# Patient Record
Sex: Male | Born: 1955 | Race: Black or African American | Hispanic: No | Smoking: Former smoker
Health system: Southern US, Community
[De-identification: ages and names within clinical notes are randomized; demographics above are authoritative.]

## PROBLEM LIST (undated history)

## (undated) ENCOUNTER — Ambulatory Visit (HOSPITAL_COMMUNITY): Admission: EM | Discharge status: Absent without leave | Payer: 59

## (undated) ENCOUNTER — Ambulatory Visit (HOSPITAL_COMMUNITY): Admission: EM | Disposition: A | Discharge status: Home / Self Care | Payer: 59

## (undated) DIAGNOSIS — M1990 Unspecified osteoarthritis, unspecified site: Secondary | ICD-10-CM

## (undated) DIAGNOSIS — I10 Essential (primary) hypertension: Secondary | ICD-10-CM

## (undated) DIAGNOSIS — C801 Malignant (primary) neoplasm, unspecified: Secondary | ICD-10-CM

## (undated) DIAGNOSIS — Z87442 Personal history of urinary calculi: Secondary | ICD-10-CM

## (undated) DIAGNOSIS — R0600 Dyspnea, unspecified: Secondary | ICD-10-CM

## (undated) DIAGNOSIS — Z800 Family history of malignant neoplasm of digestive organs: Secondary | ICD-10-CM

## (undated) DIAGNOSIS — Z5189 Encounter for other specified aftercare: Secondary | ICD-10-CM

## (undated) DIAGNOSIS — H269 Unspecified cataract: Secondary | ICD-10-CM

## (undated) DIAGNOSIS — R7303 Prediabetes: Secondary | ICD-10-CM

## (undated) DIAGNOSIS — D649 Anemia, unspecified: Secondary | ICD-10-CM

## (undated) DIAGNOSIS — Z9221 Personal history of antineoplastic chemotherapy: Secondary | ICD-10-CM

## (undated) DIAGNOSIS — H5359 Other color vision deficiencies: Secondary | ICD-10-CM

## (undated) DIAGNOSIS — I499 Cardiac arrhythmia, unspecified: Secondary | ICD-10-CM

## (undated) DIAGNOSIS — Z973 Presence of spectacles and contact lenses: Secondary | ICD-10-CM

## (undated) DIAGNOSIS — E785 Hyperlipidemia, unspecified: Secondary | ICD-10-CM

## (undated) DIAGNOSIS — T7840XA Allergy, unspecified, initial encounter: Secondary | ICD-10-CM

## (undated) DIAGNOSIS — I739 Peripheral vascular disease, unspecified: Secondary | ICD-10-CM

## (undated) DIAGNOSIS — R0683 Snoring: Secondary | ICD-10-CM

## (undated) DIAGNOSIS — Z923 Personal history of irradiation: Secondary | ICD-10-CM

## (undated) DIAGNOSIS — G2581 Restless legs syndrome: Secondary | ICD-10-CM

## (undated) DIAGNOSIS — M79669 Pain in unspecified lower leg: Secondary | ICD-10-CM

## (undated) DIAGNOSIS — R591 Generalized enlarged lymph nodes: Secondary | ICD-10-CM

## (undated) DIAGNOSIS — J449 Chronic obstructive pulmonary disease, unspecified: Secondary | ICD-10-CM

## (undated) HISTORY — DX: Peripheral vascular disease, unspecified: I73.9

## (undated) HISTORY — DX: Encounter for other specified aftercare: Z51.89

## (undated) HISTORY — DX: Personal history of irradiation: Z92.3

## (undated) HISTORY — DX: Family history of malignant neoplasm of digestive organs: Z80.0

## (undated) HISTORY — DX: Cardiac arrhythmia, unspecified: I49.9

## (undated) HISTORY — DX: Restless legs syndrome: G25.81

## (undated) HISTORY — DX: Malignant (primary) neoplasm, unspecified: C80.1

## (undated) HISTORY — DX: Anemia, unspecified: D64.9

## (undated) HISTORY — DX: Allergy, unspecified, initial encounter: T78.40XA

## (undated) HISTORY — DX: Unspecified osteoarthritis, unspecified site: M19.90

## (undated) HISTORY — DX: Pain in unspecified lower leg: M79.669

## (undated) HISTORY — PX: CATARACT EXTRACTION W/ INTRAOCULAR LENS  IMPLANT, BILATERAL: SHX1307

## (undated) HISTORY — PX: REFRACTIVE SURGERY: SHX103

## (undated) HISTORY — PX: FRACTURE SURGERY: SHX138

## (undated) HISTORY — DX: Hyperlipidemia, unspecified: E78.5

## (undated) HISTORY — PX: OTHER SURGICAL HISTORY: SHX169

## (undated) HISTORY — DX: Unspecified cataract: H26.9

## (undated) HISTORY — DX: Personal history of antineoplastic chemotherapy: Z92.21

## (undated) HISTORY — DX: Other color vision deficiencies: H53.59

## (undated) HISTORY — DX: Prediabetes: R73.03

## (undated) HISTORY — PX: COLONOSCOPY: SHX174

---

## 1993-05-09 HISTORY — PX: HAND ARTHROPLASTY: SHX968

## 1999-06-24 ENCOUNTER — Encounter: Payer: Self-pay | Admitting: Emergency Medicine

## 1999-06-24 ENCOUNTER — Emergency Department (HOSPITAL_COMMUNITY): Admission: EM | Admit: 1999-06-24 | Discharge: 1999-06-24 | Payer: Self-pay | Admitting: *Deleted

## 1999-07-02 ENCOUNTER — Encounter: Payer: Self-pay | Admitting: Orthopedic Surgery

## 1999-07-02 ENCOUNTER — Observation Stay (HOSPITAL_COMMUNITY): Admission: RE | Admit: 1999-07-02 | Discharge: 1999-07-02 | Payer: Self-pay | Admitting: Orthopedic Surgery

## 2002-11-17 ENCOUNTER — Emergency Department (HOSPITAL_COMMUNITY): Admission: EM | Admit: 2002-11-17 | Discharge: 2002-11-17 | Payer: Self-pay | Admitting: Emergency Medicine

## 2003-05-10 HISTORY — PX: ORIF FOOT FRACTURE: SHX2123

## 2006-04-04 ENCOUNTER — Emergency Department (HOSPITAL_COMMUNITY): Admission: EM | Admit: 2006-04-04 | Discharge: 2006-04-04 | Payer: Self-pay | Admitting: Emergency Medicine

## 2006-04-04 IMAGING — CR DG CHEST 1V PORT
1 series · 1 of 1 positions shown · non-contrast
Comparison: none

CLINICAL DATA: Chest pain

Portable chest at [WB]:
Comparison [DATE] by report only. The heart size and mediastinal contours are
within normal limits.  Both lungs are clear.  The visualized skeletal structures
are unremarkable.

[view not recorded]
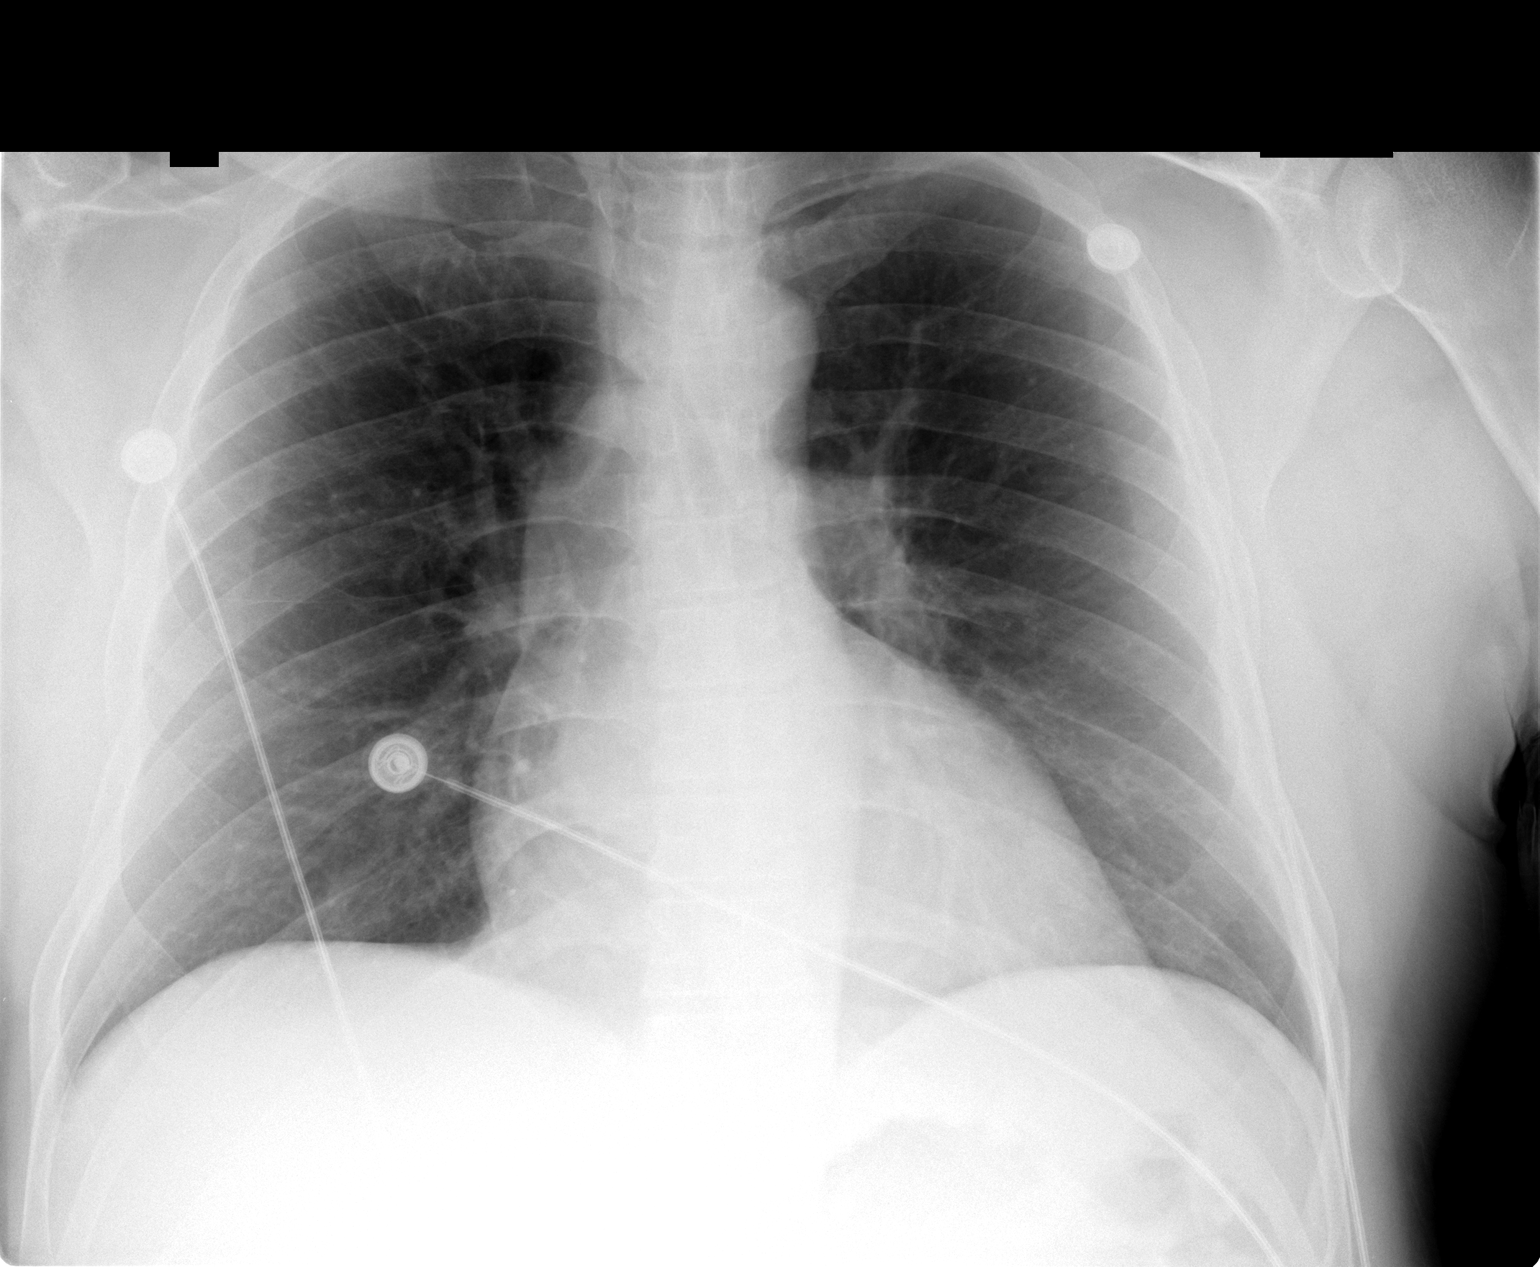

[1 of 1 positions shown; findings below may reference images not displayed]

IMPRESSION: 1. No active cardiopulmonary disease.

## 2006-05-08 ENCOUNTER — Ambulatory Visit: Payer: Self-pay | Admitting: Cardiovascular Disease

## 2006-05-22 ENCOUNTER — Ambulatory Visit: Payer: Self-pay

## 2009-08-23 ENCOUNTER — Emergency Department (HOSPITAL_COMMUNITY): Admission: EM | Admit: 2009-08-23 | Discharge: 2009-08-23 | Payer: Self-pay | Admitting: Emergency Medicine

## 2010-02-12 ENCOUNTER — Encounter: Admission: RE | Admit: 2010-02-12 | Discharge: 2010-02-12 | Payer: Self-pay | Admitting: Family Medicine

## 2010-02-12 IMAGING — CT CT ABD-PELV W/ CM
3 of 5 series · 13 of 36 positions shown, 19 images · IV contrast (READICAT/WATER & [ID] OMNI 300)
Comparison: None

CLINICAL DATA: Abdominal pain.  Left upper quadrant fullness.

CT ABDOMEN AND PELVIS WITH CONTRAST
TECHNIQUE: Multidetector CT imaging of the abdomen and pelvis was
performed following the standard protocol during bolus
administration of intravenous contrast.
Contrast: 125 ml of omni 300

[Series 3: routine abdomen · axial · 0.82mm/px · z∈[-414,-64]mm · 8 of 92 slices shown, 13 images]
[im 11/92  soft-tissue]
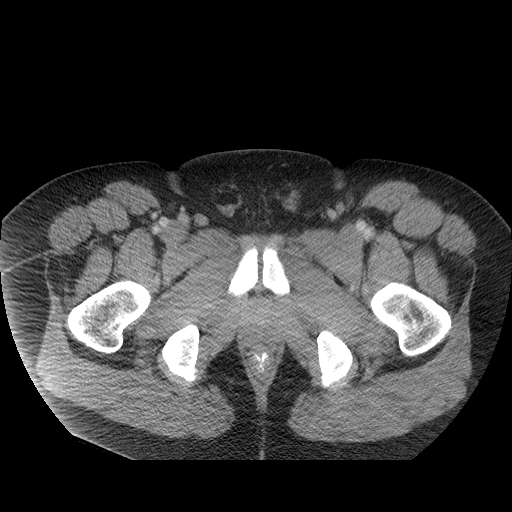
[im 11/92  bone]
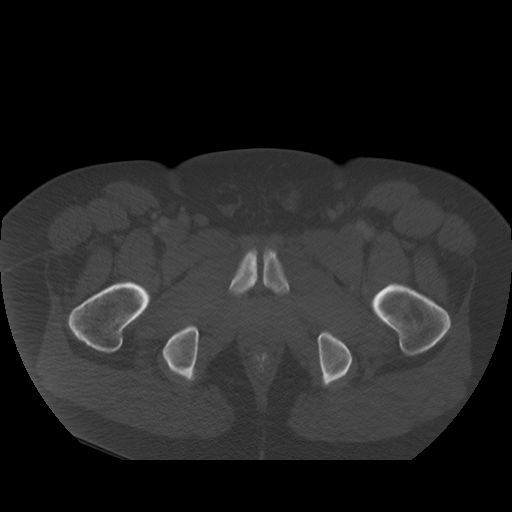
[im 21/92  soft-tissue]
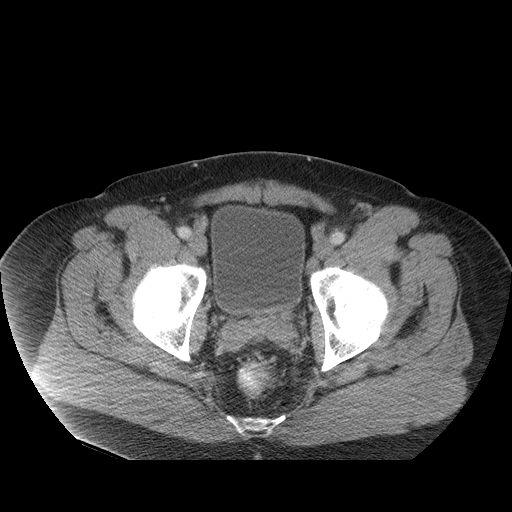
[im 31/92  soft-tissue]
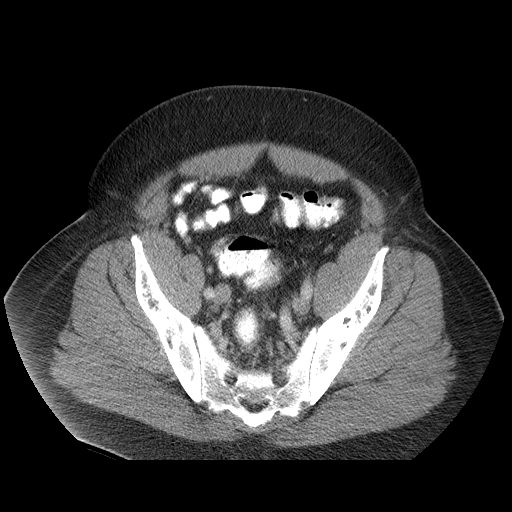
[im 41/92  soft-tissue]
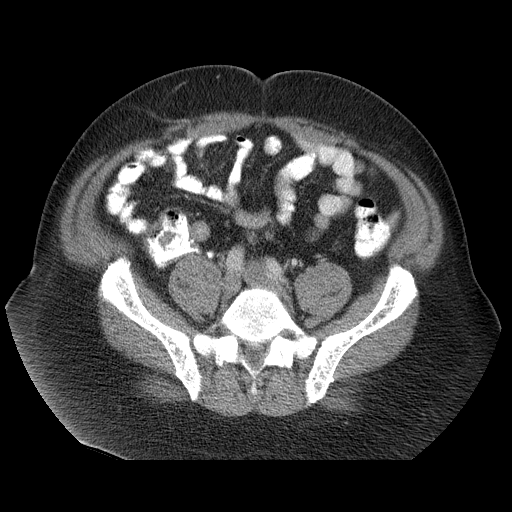
[im 51/92  soft-tissue]
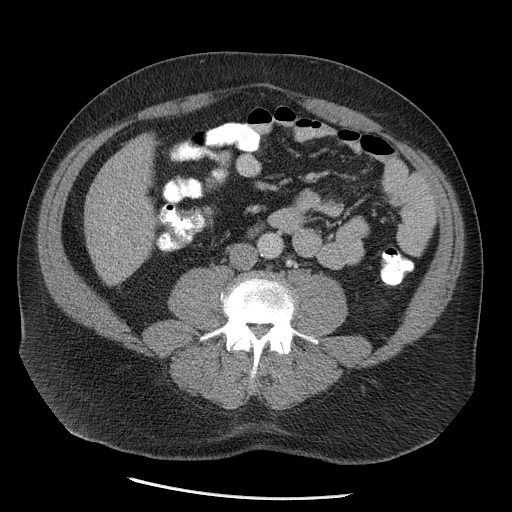
[im 51/92  lung]
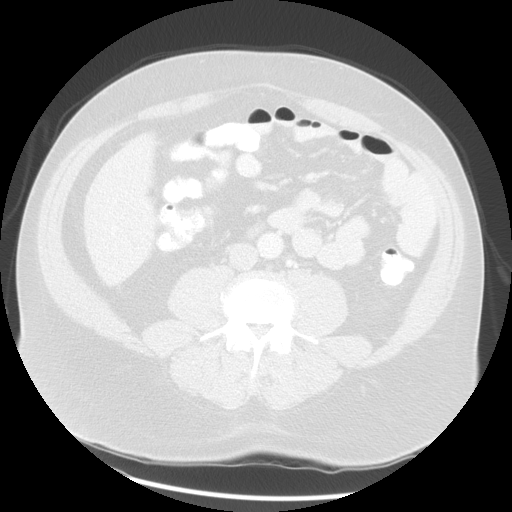
[im 61/92  soft-tissue]
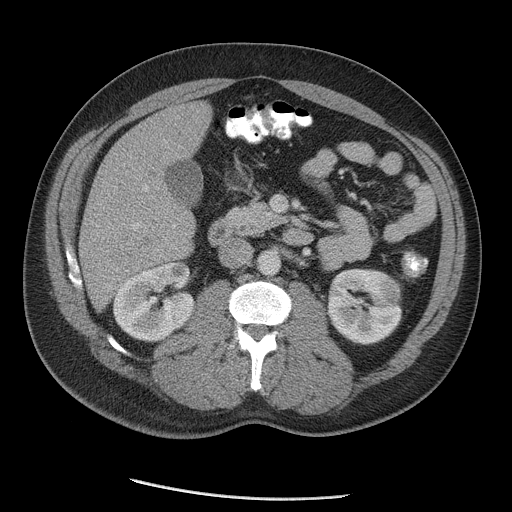
[im 61/92  lung]
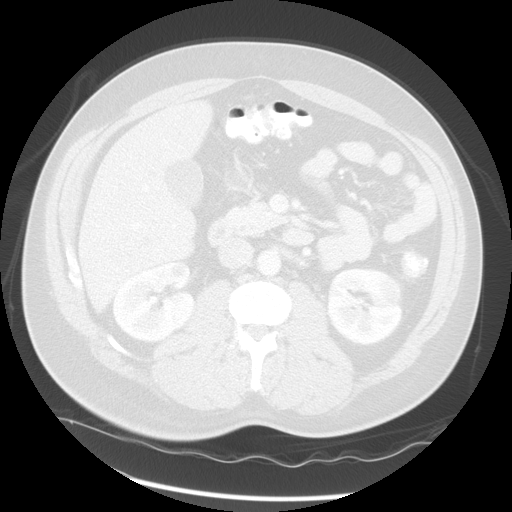
[im 71/92  soft-tissue]
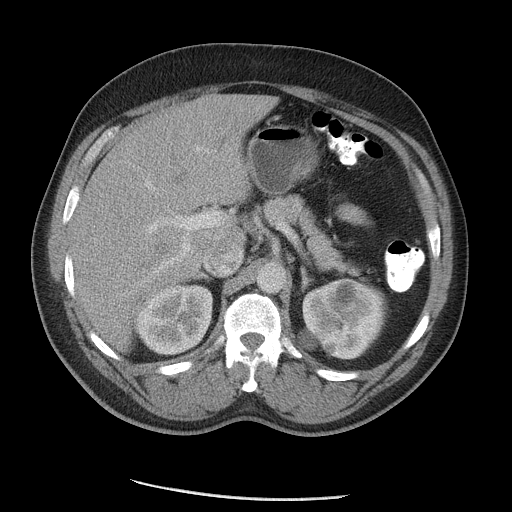
[im 71/92  lung]
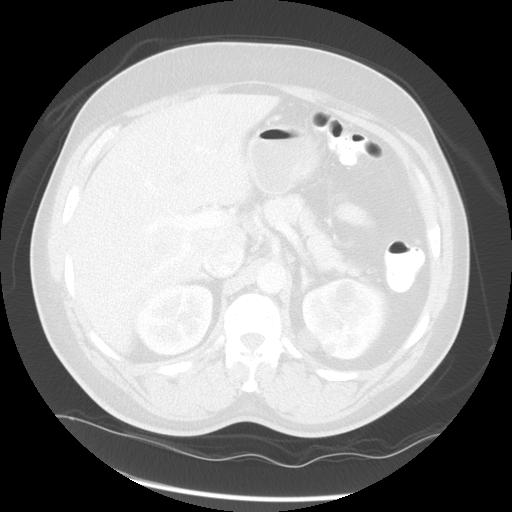
[im 81/92  soft-tissue]
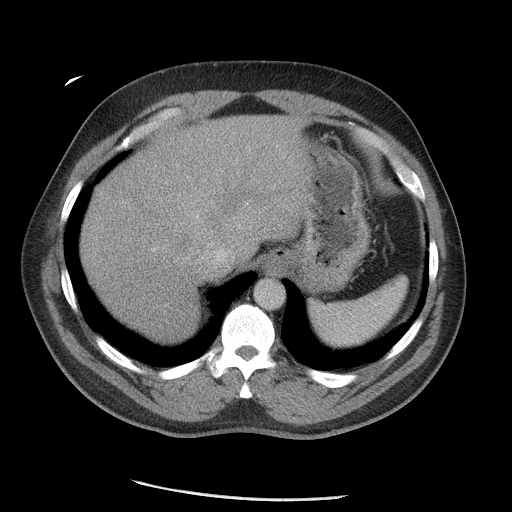
[im 81/92  lung]
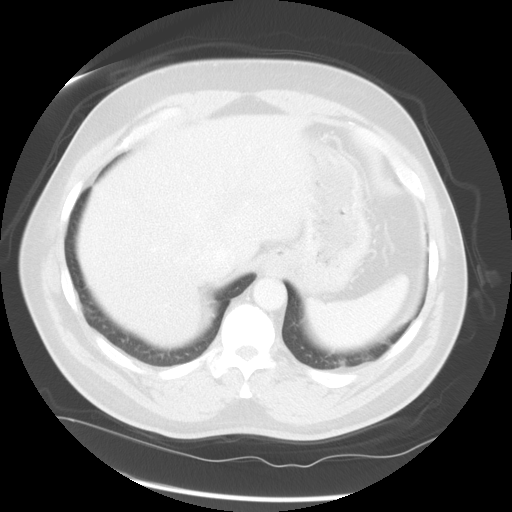

[Series 601: coronal body · coronal · 0.95mm/px · 1 of 150 slices shown, 2 images]
[im 50/150  soft-tissue]
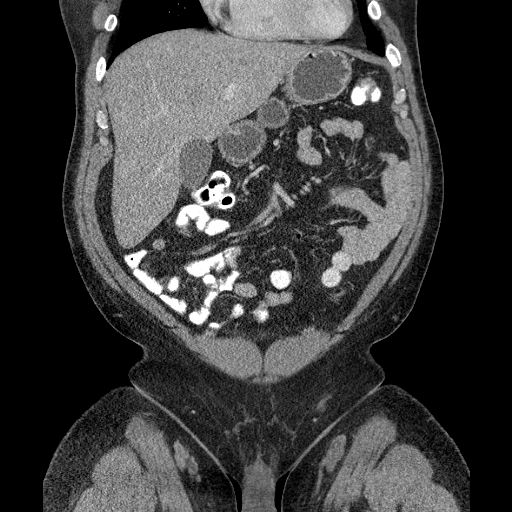
[im 50/150  bone]
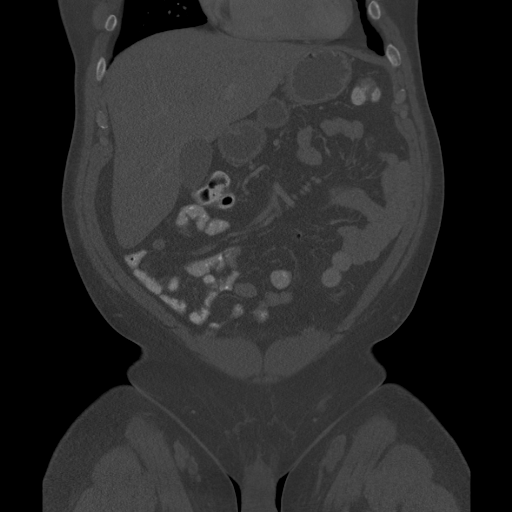

[Series 602: sagittal body · sagittal · 0.95mm/px · 4 of 170 slices shown]
[im 11/170  soft-tissue]
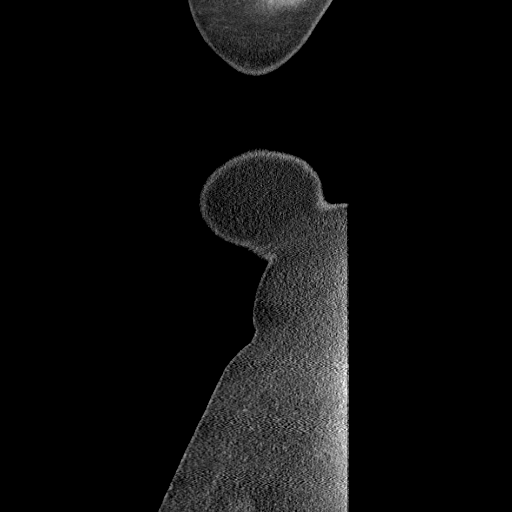
[im 32/170  soft-tissue]
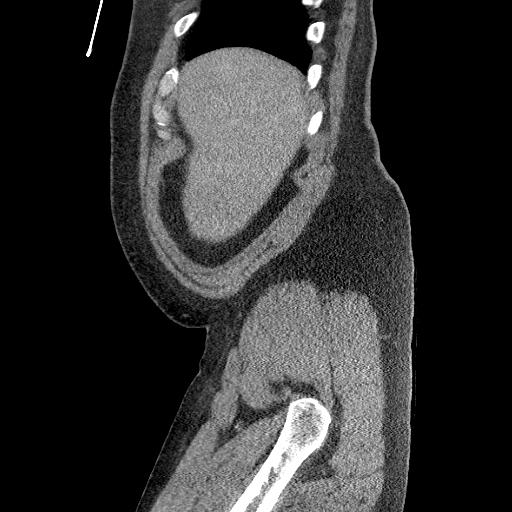
[im 53/170  soft-tissue]
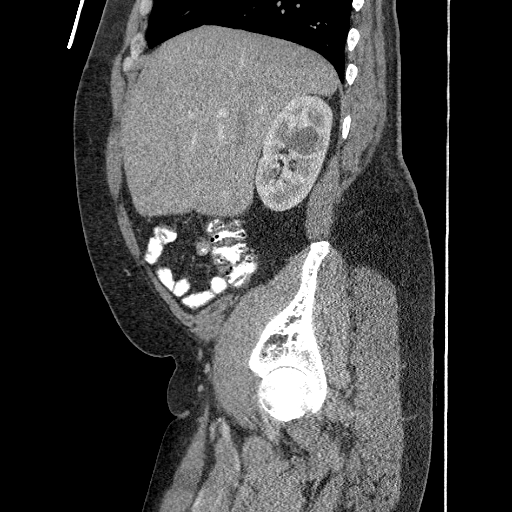
[im 74/170  soft-tissue]
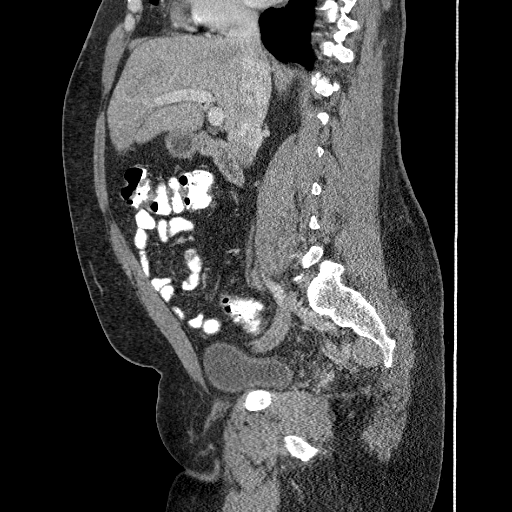

[13 of 36 positions shown; findings below may reference images not displayed]

FINDINGS: The lung bases are clear.

Liver is normal.

Spleen is normal.

Pancreas is normal.

The adrenal glands are normal.

 Multiple bilateral renal cysts are identified.

Gallbladder is unremarkable by CT.

No biliary ductal dilation.

Stomach and visualized large and small bowel are unremarkable.

Abdominal aorta normal is in caliber.

No significant lymphadenopathy.

No free fluid or abnormal fluid collections.

Appendix identified and normal.

Visualized colon and small bowel are unremarkable.

No free fluid or abnormal fluid collections.

There is no pelvic adenopathy.

Within the left inguinal region there is a pathologically enlarged
lymph node that measures 2.6 x 3.1 cm.

Urinary bladder is normal.

Review of the visualized osseous structures is significant for mild
spondylosis.
IMPRESSION: 1.  No acute findings within the abdomen or pelvis.
2.  Enlarged left inguinal lymph node is indeterminate.  This may
be reactive in etiology.  Differential considerations include
granulomatous inflammation or infection, metastatic adenopathy or
lymphoma.

## 2010-09-24 NOTE — Op Note (Signed)
[place]  Patient:    Jesus [name], Jesus [name]                       MRN: [id number] Proc. Date: 07/02/99 Adm. Date:  [id number] Disc. Date: [id number] Attending:  Carlyon Shadow                           Operative Report  PREOPERATIVE DIAGNOSIS:  Displaced left fifth metatarsal base fracture.  POSTOPERATIVE DIAGNOSIS:  Displaced left fifth metatarsal base fracture.  PROCEDURE:  Open reduction and internal fixation of left fifth metatarsal fracture.  SURGEON:  Metta Clines. Supple, M.D.  ASSISTANT:  Alexzandrew L. Perkins, P.A.-C.  ANESTHESIA:  Laryngeal mask general.  TOURNIQUET TIME:  Available from the anesthesia record.  ESTIMATED BLOOD LOSS:  Minimal.  HISTORY:  Mr. Candelas is a 55 year old gentleman who sustained an inversion twisting injury to his left foot with immediate onset of lateral mid foot pain. He was seen in local ER where x-ray showed a displaced fifth metatarsal base fracture. On follow-up in the office, examination showed a markedly swollen and tender lateral mid foot and x-rays confirmed the displaced proximal fifth metatarsal fracture.  Given the involvement of the fifth tarsometatarsal joint, it was felt that he would benefit from open reduction and internal fixation.  Preoperatively, he was counseled on treatment options, as well as risks versus benefits.  Possible complications of bleeding, infection, neurovascular injury,  DVT, persistence of pain, post traumatic arthrosis, nonunion, malunion, loss of  fixation, possible need for additional were reviewed. He understands and accepts and agrees to the plan of procedure.  DESCRIPTION OF PROCEDURE IN DETAIL:  After undergoing routine preoperative evaluation, the patient was placed supine on the operative table and underwent induction of laryngeal mask general anesthesia.  Tourniquet applied to the left  thigh.  The left leg was sterilely prepped and draped in the  standard fashion. The left foot was then sterilely prepped and draped in the standard fashion.  Left as exsanguinated with tourniquet inflated to 350 mmHg.  A lateral incision centered over the base of the fifth metatarsal was then made with sharp dissection. Electrocautery was used for hemostasis.  Dissection carried down to the fifth metatarsal base with the fracture site exposed.  Periosteal elevation was then sed to exposed the margins of the fracture site.  The fracture site itself was copiously irrigated.  The proximal aspect of the fifth metatarsal was then reduced utilizing a Mayo towel clip allowing an anatomic reduction.  Next fluoroscopic guidance was then utilized to pass the guide pin for the 4.5 cannulated self-tapping screws.  Proper position was confirmed on both AP and lateral fluoroscopic views.  This was then drilled and a 60 mm 4.5 cannulated screw was  then passed across the fracture site into the fifth metatarsal.  Good purchase as obtained and good alignment was confirmed on the radiographs on AP and lateral images.  The wound was then irrigated.  It was closed with 2-0 Vicryl for the subcutaneous and staples applied to the skin.  Adaptic and a bulky dry dressing was then applied to the left food.  A well padded plaster U-splint was applied with the ankle in the neutral position and slight eversion.  Tourniquet was let down. The patient was then extubated and taken to the recovery room in stable condition. DD:  07/02/99 TD:  07/03/99 Job: [id number]

## 2010-09-24 NOTE — Assessment & Plan Note (Signed)
[facility]                            CARDIOLOGY OFFICE NOTE   JAHDIEL, KROL                       MRN:          [id number]  DATE:05/08/2006                            DOB:          [birth year]    Mr. Ala is a 55 year old patient referred by the [facility]  emergency room and Dr. Shirline Frees.   He was in the emergency room on November 27 for chest pain.  The  evaluation was negative and he was sent home.  The patient has coronary  risk factors, which include high blood pressure, which is not treated,  and smoking.  His lipid status is unknown.  He has seen Dr. Kenton Kingfisher of  [facility] for a while, and I suspect there are some lipids on file.  The patient's chest pain is atypical.  It is sometimes exertional,  sometimes it occurs at work, but it can occur at rest.  He has had  pleurisy 10 years ago, and this reminds him of the pain.   He has not had any significant shortness of breath, cough, or lower  extremity swelling.   As far as he knows, he has not had a stress test.   Since his visit to the emergency room, he continues to get occasional  pain in his chest.  It does radiate down the arms on occasion.   He has no musculoskeletal problems and no previous history of cervical  radiculopathy.   REVIEW OF SYSTEMS:  Otherwise, negative.  He takes no medications.  He  denies any allergies.   His only previous surgery involved his foot in 1999.  He is a  [employer] officer, mixing chemicals in cleaning tanks.  He is separated.  He  has 3 children.  He likes to bowl in his spare time.   FAMILY HISTORY:  Unremarkable.  His mother is alive at age 48.  His  father died at age 39 of old age.   The patient smokes a pack a day for 29 years.  He does not drink  excessively.   EXAM:  HEENT:  Normal.  LUNGS:  Clear.  Carotids are normal.  There is no bruit.  JVP is normal.  There is an  S1, S2.  Normal heart sounds.  ABDOMEN:  Benign.  LOWER  EXTREMITIES:  Intact pulses.  No edema.  SKIN:  Warm and dry.  NEURO:  Nonfocal.   EKG:  Sinus rhythm with right bundle branch block, left axis deviation.   VITAL SIGNS:  Included a blood pressure of 154/93 and a pulse of 98.   IMPRESSION:  Mr. Marcussen presents with several issues.  His chest pain.  Although it is atypical, he is a smoker with probable high blood  pressure.  I think he deserves to have a stress test.  As his baseline  electrocardiogram is abnormal, we will arrange for him to have a stress  Myoview.  I am also anxious to see what his blood pressure response is.  I am sure his systolics will be close to 200.  Along these lines, I  think  he needs to be on blood pressure medicine.  I think, since he is  relatively tachycardic, we will start him on a beta blocker.  He is a  little bit concerned about erectile dysfunction with this, and I told  him we would start on a low dose.  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  In  regards to his abnormal electrocardiogram, I will contact Dr. Kenton Kingfisher'  office and see if we have an old electrocardiogram to compare to.  I do  not think that the low-dose beta blocker will precipitate any high-grade  AV block given his hypertension and high resting heart rate, I think  that the beta blocker is the best choice.  If, for some reason, his  heart block worsens or he has significant erectile dysfunction, then we  can switch him to an angiotensin-converting enzyme inhibitor.   I talked to him at length about losing weight, his diet, and the need  for increased exercise.   I will see him back after his stress Myoview.     Wallis Bamberg. Johnsie Cancel, MD, [facility]  Electronically Signed    PCN/MedQ  DD: 05/08/2006  DT: 05/08/2006  Job #: [id number]

## 2011-05-08 ENCOUNTER — Ambulatory Visit (INDEPENDENT_AMBULATORY_CARE_PROVIDER_SITE_OTHER): Payer: 59

## 2011-05-08 DIAGNOSIS — L02619 Cutaneous abscess of unspecified foot: Secondary | ICD-10-CM

## 2011-05-08 DIAGNOSIS — L03119 Cellulitis of unspecified part of limb: Secondary | ICD-10-CM

## 2011-05-08 DIAGNOSIS — L241 Irritant contact dermatitis due to oils and greases: Secondary | ICD-10-CM

## 2014-01-15 ENCOUNTER — Encounter (HOSPITAL_BASED_OUTPATIENT_CLINIC_OR_DEPARTMENT_OTHER): Payer: Self-pay | Admitting: *Deleted

## 2014-01-15 NOTE — Progress Notes (Signed)
01/15/14 1145  OBSTRUCTIVE SLEEP APNEA  Have you ever been diagnosed with sleep apnea through a sleep study? No  Do you snore loudly (loud enough to be heard through closed doors)?  1  Do you often feel tired, fatigued, or sleepy during the daytime? 0  Has anyone observed you stop breathing during your sleep? 0  Do you have, or are you being treated for high blood pressure? 1  BMI more than 35 kg/m2? 1  Age over 58 years old? 1  Neck circumference greater than 40 cm/16 inches? 1  Gender: 1  Obstructive Sleep Apnea Score 6  Score 4 or greater  Results sent to PCP

## 2014-01-15 NOTE — Progress Notes (Signed)
To come in for bmet-ekg

## 2014-01-16 ENCOUNTER — Encounter (HOSPITAL_BASED_OUTPATIENT_CLINIC_OR_DEPARTMENT_OTHER)
Admission: RE | Admit: 2014-01-16 | Discharge: 2014-01-16 | Disposition: A | Discharge status: Home / Self Care | Payer: 59 | Source: Ambulatory Visit | Attending: Otolaryngology | Admitting: Otolaryngology

## 2014-01-16 DIAGNOSIS — Z79899 Other long term (current) drug therapy: Secondary | ICD-10-CM | POA: Diagnosis not present

## 2014-01-16 DIAGNOSIS — F17200 Nicotine dependence, unspecified, uncomplicated: Secondary | ICD-10-CM | POA: Diagnosis not present

## 2014-01-16 DIAGNOSIS — J383 Other diseases of vocal cords: Secondary | ICD-10-CM | POA: Diagnosis present

## 2014-01-16 DIAGNOSIS — I10 Essential (primary) hypertension: Secondary | ICD-10-CM | POA: Diagnosis not present

## 2014-01-16 LAB — BASIC METABOLIC PANEL WITH GFR
BUN: 19 mg/dL (ref 6–23)
CO2: 25 meq/L (ref 19–32)
GFR calc non Af Amer: 81 mL/min — ABNORMAL LOW (ref >90)
Glucose, Bld: 110 mg/dL — ABNORMAL HIGH (ref 70–99)
Potassium: 3.7 meq/L (ref 3.7–5.3)
Sodium: 142 meq/L (ref 137–147)

## 2014-01-16 LAB — BASIC METABOLIC PANEL
Anion gap: 12 (ref 5–15)
Calcium: 8.7 mg/dL (ref 8.4–10.5)
Chloride: 105 mEq/L (ref 96–112)
Creatinine, Ser: 1.01 mg/dL (ref 0.50–1.35)
GFR calc Af Amer: >90 mL/min (ref >90)

## 2014-01-16 NOTE — Progress Notes (Signed)
ekg cleared by dr e. Ola Spurr

## 2014-01-16 NOTE — H&P (Signed)
Assessment  Hoarseness (784.42) (R49.0). Smokes tobacco daily (305.1) (Z72.0). 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 Discussed  Chronic hoarseness and a smoker with a left anterior vocal cord lesion. This could represent papilloma, granuloma, inflammatory nodule, carcinoma. Recommend microlaryngoscopy with excision and biopsy. Urged him to try to stop smoking. Reason For Visit  Throat and hoarseness. HPI  One month history of hoarseness. It seems to be partially intermittent. He denies any sore throat or difficulty swallowing. He smokes one pack per day. He drinks alcohol several times each week and drinks a couple of cups of coffee most days. Allergies  Rec: [id number].  List Reconciled and Reviewed. No Known Drug Allergies Amended Iran Sizer  LPN; 25/63/8937 3:42 PM EST. Current Meds  Rec: [id number].  List Reconciled and Reviewed. Cialis TABS;; RPT Lisinopril TABS;; RPT Norvasc TABS (AmLODIPine Besylate);; RPT Amended : Iran Sizer  LPN; 87/68/1157 2:62 PM EST. 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 [facility] Hand Surgery Amended : Iran Sizer  LPN; 45/36/4680 3:21 PM EST. Family Hx  Family history of hypertension: Father (V17.49) (Z82.49) No pertinent family history: Mother Amended Iran Sizer  LPN; 22/48/2500 3:70 PM EST. 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 ROS  12 system ROS was obtained and reviewed on the Health Maintenance form dated today.  Positive responses are shown above.  If the symptom is not checked, the patient has denied it. Vital Signs   Recorded by Skolimowski,Sharon on 09 Jan 2014 02:10 PM BP:132/80,  Height: 6 ft , Weight: 271 lb , BMI: 36.8 kg/m2,  BMI Calculated: 36.75 ,  BSA Calculated: 2.42  Amended : Iran Sizer  LPN; 38/88/2800 3:49 PM EST. Physical Exam  APPEARANCE: Well developed, overweight gentleman, in no acute distress.  Normal affect, in a pleasant mood.  Oriented to time, place and person. COMMUNICATION: Normal voice   HEAD & FACE:  No scars, lesions or masses of head and face.  Sinuses nontender to palpation.  Salivary glands without mass or tenderness.  Facial strength symmetric.  No facial lesion, scars, or mass. EYES: EOMI with normal primary gaze alignment. Visual acuity grossly intact.  PERRLA EXTERNAL EAR & NOSE: No scars, lesions or masses  EAC & TYMPANIC MEMBRANE:  EAC shows no obstructing lesions or debris and tympanic membranes are normal bilaterally with good movement to insufflation. GROSS HEARING: Normal   TMJ:  Nontender  INTRANASAL EXAM: No polyps or purulence.  NASOPHARYNX: Normal, without lesions. LIPS, TEETH & GUMS: No lip lesions,  and normal gums. ORAL CAVITY/OROPHARYNX:  Oral mucosa moist without lesion or asymmetry of the palate, tongue, tonsil or posterior pharynx. Unable to visualize the cords with indirect exam. NECK:  Supple without adenopathy or mass. THYROID:  Normal with no masses palpable.  NEUROLOGIC:  No gross CN deficits. No nystagmus noted.   LYMPHATIC:  No enlarged nodes palpable. Procedure  Fiberoptic Laryngoscopy Name: Jesus [name]     Age: 58 year     The risks and benefits of this procedure have been thoroughly discussed with the patient/parent.  The most commons risks outlined included but were not  limited to: injury  to the nasal mucosa or throat irritation.  The patient/parent was further informed that there are other less common risks.  The patient/parent was given the opportunity to ask questions and all such questions were answered to the  patient/parent's satisfaction.  Patient/parent acknowledged the risks and has agreed to proceed.   Performing Provider: Izora Gala The risks of the procedure are minimal and were discussed with the patient today. Pre-op Diagnosis: hoarseness  Post-op Diagnosis:Same Allergy:  reviewed allergies as listed Nasal Prep:Lidocaine/Afrin   Procedure:     With the patient seated in the exam chair, the L nasal cavity was intubated with the flexible laryngoscope.  The nasal cavity mucosa, nasopharynx, hypopharynx and larynx were all examined with findings as noted.  The scope was then removed.  The patient tolerated the procedure well without complication or blood loss (unless indicated in findings).   FINDINGS:   Nasal cavity and nasopharynx clear. Oral cavity and oropharynx are clear. Hypopharynx clear. Larynx reveals mobile cords. There is fullness of the arytenoids bilaterally. There is a small exophytic growth along the contacting surface of the left anterior vocal folds. No other lesions are identified. . Signature  Electronically signed by : Izora Gala  M.D.; 01/09/2014 11:50 AM EST. Electronically signed by : Izora Gala  M.D.; 01/09/2014 2:15 PM EST.

## 2014-01-17 ENCOUNTER — Ambulatory Visit (HOSPITAL_BASED_OUTPATIENT_CLINIC_OR_DEPARTMENT_OTHER): Payer: 59 | Admitting: Anesthesiology

## 2014-01-17 ENCOUNTER — Ambulatory Visit (HOSPITAL_BASED_OUTPATIENT_CLINIC_OR_DEPARTMENT_OTHER)
Admission: RE | Admit: 2014-01-17 | Discharge: 2014-01-17 | Disposition: A | Discharge status: Home / Self Care | Payer: 59 | Source: Ambulatory Visit | Attending: Otolaryngology | Admitting: Otolaryngology

## 2014-01-17 ENCOUNTER — Encounter (HOSPITAL_BASED_OUTPATIENT_CLINIC_OR_DEPARTMENT_OTHER)
Admission: RE | Disposition: A | Discharge status: Home / Self Care | Payer: Self-pay | Source: Ambulatory Visit | Attending: Otolaryngology

## 2014-01-17 ENCOUNTER — Encounter (HOSPITAL_BASED_OUTPATIENT_CLINIC_OR_DEPARTMENT_OTHER): Payer: Self-pay

## 2014-01-17 ENCOUNTER — Encounter (HOSPITAL_BASED_OUTPATIENT_CLINIC_OR_DEPARTMENT_OTHER): Payer: 59 | Admitting: Anesthesiology

## 2014-01-17 DIAGNOSIS — Z79899 Other long term (current) drug therapy: Secondary | ICD-10-CM | POA: Insufficient documentation

## 2014-01-17 DIAGNOSIS — F17200 Nicotine dependence, unspecified, uncomplicated: Secondary | ICD-10-CM | POA: Insufficient documentation

## 2014-01-17 DIAGNOSIS — I10 Essential (primary) hypertension: Secondary | ICD-10-CM | POA: Insufficient documentation

## 2014-01-17 DIAGNOSIS — J383 Other diseases of vocal cords: Secondary | ICD-10-CM | POA: Insufficient documentation

## 2014-01-17 HISTORY — DX: Essential (primary) hypertension: I10

## 2014-01-17 HISTORY — DX: Presence of spectacles and contact lenses: Z97.3

## 2014-01-17 HISTORY — DX: Snoring: R06.83

## 2014-01-17 HISTORY — PX: MICROLARYNGOSCOPY: SHX5208

## 2014-01-17 LAB — POCT HEMOGLOBIN-HEMACUE: Hemoglobin: 13.2 g/dL (ref 13.0–17.0)

## 2014-01-17 SURGERY — MICROLARYNGOSCOPY
Anesthesia: General | Site: Throat | Laterality: Left

## 2014-01-17 MED ORDER — FENTANYL CITRATE 0.05 MG/ML IJ SOLN: 50.0000 ug | INTRAMUSCULAR | Status: DC | PRN

## 2014-01-17 MED ORDER — PROPOFOL 10 MG/ML IV BOLUS
INTRAVENOUS | Status: DC | PRN
  Administered 2014-01-17: 200 mg via INTRAVENOUS

## 2014-01-17 MED ORDER — MIDAZOLAM HCL 2 MG/2ML IJ SOLN: 1.0000 mg | INTRAMUSCULAR | Status: DC | PRN

## 2014-01-17 MED ORDER — LIDOCAINE HCL (CARDIAC) 20 MG/ML IV SOLN
INTRAVENOUS | Status: DC | PRN
  Administered 2014-01-17: 50 mg via INTRAVENOUS

## 2014-01-17 MED ORDER — MIDAZOLAM HCL 5 MG/5ML IJ SOLN
INTRAMUSCULAR | Status: DC | PRN
  Administered 2014-01-17: 2 mg via INTRAVENOUS

## 2014-01-17 MED ORDER — PROPOFOL 10 MG/ML IV EMUL
INTRAVENOUS | Status: AC
  Filled 2014-01-17: qty 100

## 2014-01-17 MED ORDER — LIDOCAINE-EPINEPHRINE 1 %-1:100000 IJ SOLN
INTRAMUSCULAR | Status: AC
Start: 2014-01-17 — End: 2014-01-17
  Filled 2014-01-17: qty 2

## 2014-01-17 MED ORDER — ONDANSETRON HCL 4 MG/2ML IJ SOLN: 4.0000 mg | Freq: Once | INTRAMUSCULAR | Status: DC | PRN

## 2014-01-17 MED ORDER — EPINEPHRINE HCL 1 MG/ML IJ SOLN
INTRAMUSCULAR | Status: AC
  Filled 2014-01-17: qty 1

## 2014-01-17 MED ORDER — NEOSTIGMINE METHYLSULFATE 10 MG/10ML IV SOLN
INTRAVENOUS | Status: DC | PRN
  Administered 2014-01-17: 4 mg via INTRAVENOUS

## 2014-01-17 MED ORDER — ONDANSETRON HCL 4 MG/2ML IJ SOLN
INTRAMUSCULAR | Status: DC | PRN
  Administered 2014-01-17: 4 mg via INTRAVENOUS

## 2014-01-17 MED ORDER — OXYCODONE HCL 5 MG/5ML PO SOLN: 5.0000 mg | Freq: Once | ORAL | Status: DC | PRN

## 2014-01-17 MED ORDER — FENTANYL CITRATE 0.05 MG/ML IJ SOLN
INTRAMUSCULAR | Status: DC | PRN
  Administered 2014-01-17: 50 ug via INTRAVENOUS

## 2014-01-17 MED ORDER — OXYCODONE HCL 5 MG PO TABS: 5.0000 mg | ORAL_TABLET | Freq: Once | ORAL | Status: DC | PRN

## 2014-01-17 MED ORDER — HYDROMORPHONE HCL PF 1 MG/ML IJ SOLN: 0.2500 mg | INTRAMUSCULAR | Status: DC | PRN

## 2014-01-17 MED ORDER — LIDOCAINE-EPINEPHRINE 1 %-1:100000 IJ SOLN
INTRAMUSCULAR | Status: DC | PRN
  Administered 2014-01-17: .5 mL

## 2014-01-17 MED ORDER — GLYCOPYRROLATE 0.2 MG/ML IJ SOLN
INTRAMUSCULAR | Status: DC | PRN
  Administered 2014-01-17: 0.6 mg via INTRAVENOUS

## 2014-01-17 MED ORDER — LACTATED RINGERS IV SOLN
INTRAVENOUS | Status: DC
  Administered 2014-01-17 (×2): via INTRAVENOUS

## 2014-01-17 MED ORDER — FENTANYL CITRATE 0.05 MG/ML IJ SOLN
INTRAMUSCULAR | Status: AC
  Filled 2014-01-17: qty 6

## 2014-01-17 MED ORDER — PROPOFOL 10 MG/ML IV BOLUS
INTRAVENOUS | Status: AC
  Filled 2014-01-17: qty 60

## 2014-01-17 MED ORDER — MEPERIDINE HCL 25 MG/ML IJ SOLN: 6.2500 mg | INTRAMUSCULAR | Status: DC | PRN

## 2014-01-17 MED ORDER — MIDAZOLAM HCL 2 MG/2ML IJ SOLN
INTRAMUSCULAR | Status: AC
  Filled 2014-01-17: qty 2

## 2014-01-17 MED ORDER — ESMOLOL HCL 10 MG/ML IV SOLN
INTRAVENOUS | Status: DC | PRN
  Administered 2014-01-17: 20 mg via INTRAVENOUS
  Administered 2014-01-17: 10 mg via INTRAVENOUS

## 2014-01-17 MED ORDER — SUCCINYLCHOLINE CHLORIDE 20 MG/ML IJ SOLN
INTRAMUSCULAR | Status: DC | PRN
  Administered 2014-01-17: 50 mg via INTRAVENOUS

## 2014-01-17 MED ORDER — OXYMETAZOLINE HCL 0.05 % NA SOLN
NASAL | Status: AC
  Filled 2014-01-17: qty 30

## 2014-01-17 MED ORDER — DEXAMETHASONE SODIUM PHOSPHATE 4 MG/ML IJ SOLN
INTRAMUSCULAR | Status: DC | PRN
  Administered 2014-01-17: 10 mg via INTRAVENOUS

## 2014-01-17 MED ORDER — BACITRACIN ZINC 500 UNIT/GM EX OINT
TOPICAL_OINTMENT | CUTANEOUS | Status: AC
  Filled 2014-01-17: qty 1.8

## 2014-01-17 SURGICAL SUPPLY — 24 items
CANISTER SUCT 1200ML W/VALVE (MISCELLANEOUS) ×3 IMPLANT
GLOVE ECLIPSE 7.5 STRL STRAW (GLOVE) ×3 IMPLANT
GLOVE SURG SS PI 7.0 STRL IVOR (GLOVE) ×3 IMPLANT
GOWN STRL REUS W/ TWL LRG LVL3 (GOWN DISPOSABLE) ×2 IMPLANT
GOWN STRL REUS W/ TWL XL LVL3 (GOWN DISPOSABLE) IMPLANT
GOWN STRL REUS W/TWL LRG LVL3 (GOWN DISPOSABLE) ×1
GOWN STRL REUS W/TWL XL LVL3 (GOWN DISPOSABLE)
GUARD TEETH (MISCELLANEOUS) ×3 IMPLANT
MARKER SKIN DUAL TIP RULER LAB (MISCELLANEOUS) IMPLANT
NEEDLE HYPO 18GX1.5 BLUNT FILL (NEEDLE) ×3 IMPLANT
NEEDLE SPNL 22GX7 QUINCKE BK (NEEDLE) ×3 IMPLANT
NS IRRIG 1000ML POUR BTL (IV SOLUTION) ×3 IMPLANT
PATTIES SURGICAL .5 X3 (DISPOSABLE) ×3 IMPLANT
REDUCTION FITTING 1/4 IN (FILTER) IMPLANT
SHEET MEDIUM DRAPE 40X70 STRL (DRAPES) ×3 IMPLANT
SLEEVE SCD COMPRESS KNEE MED (MISCELLANEOUS) ×3 IMPLANT
SOLUTION BUTLER CLEAR DIP (MISCELLANEOUS) ×3 IMPLANT
SPONGE GAUZE 4X4 12PLY STER LF (GAUZE/BANDAGES/DRESSINGS) ×6 IMPLANT
SURGILUBE 2OZ TUBE FLIPTOP (MISCELLANEOUS) IMPLANT
SYR 5ML LL (SYRINGE) ×3 IMPLANT
SYR CONTROL 10ML LL (SYRINGE) IMPLANT
SYR TB 1ML LL NO SAFETY (SYRINGE) ×3 IMPLANT
TOWEL OR 17X24 6PK STRL BLUE (TOWEL DISPOSABLE) ×3 IMPLANT
TUBE CONNECTING 20X1/4 (TUBING) ×3 IMPLANT

## 2014-01-17 NOTE — Transfer of Care (Signed)
Immediate Anesthesia Transfer of Care Note  Patient: Jesus [name]  Procedure(s) Performed: Procedure(s): MICROLARYNGOSCOPY WITH EXCISION OF THE BIOPSY OF LEFT VOCAL CORD LESION (Left)  Patient Location: PACU  Anesthesia Type:General  Level of Consciousness: awake, alert , oriented and patient cooperative  Airway & Oxygen Therapy: Patient Spontanous Breathing and Patient connected to face mask oxygen  Post-op Assessment: Report given to PACU RN and Post -op Vital signs reviewed and stable  Post vital signs: Reviewed and stable  Complications: No apparent anesthesia complications

## 2014-01-17 NOTE — Discharge Instructions (Signed)
Dr. Constance Holster will call you with results next week.  You may resume normal diet and normal activities tomorrow.  You may take Motrin, Advil, Tylenol as needed.  Avoid screaming, straining your voice, whispering.   Post Anesthesia Home Care Instructions  Activity: Get plenty of rest for the remainder of the day. A responsible adult should stay with you for 24 hours following the procedure.  For the next 24 hours, DO NOT: -Drive a car -Paediatric nurse -Drink alcoholic beverages -Take any medication unless instructed by your physician -Make any legal decisions or sign important papers.  Meals: Start with liquid foods such as gelatin or soup. Progress to regular foods as tolerated. Avoid greasy, spicy, heavy foods. If nausea and/or vomiting occur, drink only clear liquids until the nausea and/or vomiting subsides. Call your physician if vomiting continues.  Special Instructions/Symptoms: Your throat may feel dry or sore from the anesthesia or the breathing tube placed in your throat during surgery. If this causes discomfort, gargle with warm salt water. The discomfort should disappear within 24 hours.

## 2014-01-17 NOTE — Op Note (Signed)
OPERATIVE REPORT  DATE OF SURGERY: 01/17/2014  PATIENT:  Jesus [name],  58 y.o. male  PRE-OPERATIVE DIAGNOSIS:  left vocal cord lesion  POST-OPERATIVE DIAGNOSIS:  left vocal cord lesion  PROCEDURE:  Procedure(s): MICROLARYNGOSCOPY WITH EXCISION OF THE BIOPSY OF LEFT VOCAL CORD LESION  SURGEON:  Beckie Salts, MD  ASSISTANTS: none  ANESTHESIA:   General   EBL:  3 ml  DRAINS: none  LOCAL MEDICATIONS USED:  None  SPECIMEN:  Left vocal cord lesion  COUNTS:  Correct  PROCEDURE DETAILS: The patient was taken to the operating room and placed on the operating table in the supine position. Following induction of general endotracheal anesthesia, the table was turned 90. A head drape was applied. A maxillary tooth guard was used. A Jako laryngoscope was entered into the oral cavity and used to view the larynx. The scope was attached to the [place] stand using the suspension apparatus. The operating microscope was brought into view. The larynx was inspected. A smooth ovoid-shaped pedunculated lesion was identified attached to the membranous vocal fold on the left anteriorly. The mucosa was injected with Xylocaine with epinephrine solution surrounding the lesion. The lesion was removed using up-biting microlaryngoscopy scissors and suctioned to remove it. Topical adrenaline was applied on pledgets. There were no other abnormalities identified. The lesion was sent for pathologic evaluation. The scope was removed. The patient was awakened, extubated and transferred to recovery in stable condition    PATIENT DISPOSITION:  To PACU, stable

## 2014-01-17 NOTE — Interval H&P Note (Signed)
History and Physical Interval Note:  01/17/2014 7:26 AM  Jesus [name]  has presented today for surgery, with the diagnosis of left vocal cord lesion  The various methods of treatment have been discussed with the patient and family. After consideration of risks, benefits and other options for treatment, the patient has consented to  Procedure(s): MICROLARYNGOSCOPY WITH EXCISION OF THE BIOPSY OF LEFT VOCAL CORD LESION (Left) as a surgical intervention .  The patient's history has been reviewed, patient examined, no change in status, stable for surgery.  I have reviewed the patient's chart and labs.  Questions were answered to the patient's satisfaction.     Dewain Platz

## 2014-01-17 NOTE — Anesthesia Procedure Notes (Signed)
Procedure Name: Intubation Date/Time: 01/17/2014 7:41 AM Performed by: Marrianne Mood Pre-anesthesia Checklist: Patient identified, Emergency Drugs available, Suction available, Patient being monitored and Timeout performed Patient Re-evaluated:Patient Re-evaluated prior to inductionOxygen Delivery Method: Circle System Utilized Preoxygenation: Pre-oxygenation with 100% oxygen Intubation Type: IV induction Ventilation: Mask ventilation without difficulty Tube type: Oral Tube size: 7.0 mm Number of attempts: 1 Airway Equipment and Method: stylet and oral airway Placement Confirmation: ETT inserted through vocal cords under direct vision,  positive ETCO2 and breath sounds checked- equal and bilateral Tube secured with: Tape Dental Injury: Teeth and Oropharynx as per pre-operative assessment

## 2014-01-17 NOTE — Anesthesia Postprocedure Evaluation (Signed)
  Anesthesia Post-op Note  Patient: Jesus [name]  Procedure(s) Performed: Procedure(s): MICROLARYNGOSCOPY WITH EXCISION OF THE BIOPSY OF LEFT VOCAL CORD LESION (Left)  Patient Location: PACU  Anesthesia Type:General  Level of Consciousness: awake and alert   Airway and Oxygen Therapy: Patient Spontanous Breathing  Post-op Pain: none  Post-op Assessment: Post-op Vital signs reviewed, Patient's Cardiovascular Status Stable and Respiratory Function Stable  Post-op Vital Signs: Reviewed  Filed Vitals:   01/17/14 0845  BP: 128/68  Pulse: 70  Temp: 36.7 C  Resp: 16    Complications: No apparent anesthesia complications

## 2014-01-17 NOTE — Anesthesia Preprocedure Evaluation (Signed)
Anesthesia Evaluation  Patient identified by MRN, date of birth, ID band Patient awake    Reviewed: Allergy & Precautions, H&P , NPO status , Patient's Chart, lab work & pertinent test results  Airway Mallampati: I TM Distance: >3 FB Neck ROM: Full    Dental   Pulmonary Current Smoker,          Cardiovascular hypertension, Pt. on medications     Neuro/Psych    GI/Hepatic   Endo/Other    Renal/GU      Musculoskeletal   Abdominal   Peds  Hematology   Anesthesia Other Findings   Reproductive/Obstetrics                           Anesthesia Physical Anesthesia Plan  ASA: II  Anesthesia Plan: General   Post-op Pain Management:    Induction: Intravenous  Airway Management Planned: Oral ETT  Additional Equipment:   Intra-op Plan:   Post-operative Plan: Extubation in OR  Informed Consent: I have reviewed the patients History and Physical, chart, labs and discussed the procedure including the risks, benefits and alternatives for the proposed anesthesia with the patient or authorized representative who has indicated his/her understanding and acceptance.     Plan Discussed with: CRNA and Surgeon  Anesthesia Plan Comments:         Anesthesia Quick Evaluation

## 2014-01-20 ENCOUNTER — Encounter (HOSPITAL_BASED_OUTPATIENT_CLINIC_OR_DEPARTMENT_OTHER): Payer: Self-pay | Admitting: Otolaryngology

## 2014-04-27 ENCOUNTER — Ambulatory Visit (INDEPENDENT_AMBULATORY_CARE_PROVIDER_SITE_OTHER): Payer: 59 | Admitting: Emergency Medicine

## 2014-04-27 VITALS — BP 148/84 | HR 72 | Temp 98.1°F | Resp 20 | Ht 71.0 in | Wt 292.1 lb

## 2014-04-27 DIAGNOSIS — I10 Essential (primary) hypertension: Secondary | ICD-10-CM

## 2014-04-27 DIAGNOSIS — I70219 Atherosclerosis of native arteries of extremities with intermittent claudication, unspecified extremity: Secondary | ICD-10-CM

## 2014-04-27 DIAGNOSIS — E785 Hyperlipidemia, unspecified: Secondary | ICD-10-CM

## 2014-04-27 DIAGNOSIS — R7309 Other abnormal glucose: Secondary | ICD-10-CM

## 2014-04-27 LAB — POCT GLYCOSYLATED HEMOGLOBIN (HGB A1C): Hemoglobin A1C: 5.4

## 2014-04-27 MED ORDER — BUPROPION HCL 100 MG PO TABS: 100.0000 mg | ORAL_TABLET | Freq: Two times a day (BID) | ORAL | Status: DC

## 2014-04-27 MED ORDER — TRAMADOL HCL 50 MG PO TABS: 50.0000 mg | ORAL_TABLET | Freq: Three times a day (TID) | ORAL | Status: DC | PRN

## 2014-04-27 NOTE — Patient Instructions (Signed)
Intermittent Claudication Blockage of leg arteries results from poor circulation of blood in the leg arteries. This produces an aching, tired, and sometimes burning pain in the legs that is brought on by exercise and made better by rest. Claudication refers to the limping that happens from leg cramps. It is also referred to as Vaso-occlusive disease of the legs, arterial insufficiency of the legs, recurrent leg pain, recurrent leg cramping and calf pain with exercise.  CAUSES  This condition is due to narrowing or blockage of the arteries (muscular vessels which carry blood away from the heart and around the body). Blockage of arteries can occur anywhere in the body. If they occur in the heart, a person may experience angina (chest pain) or even a heart attack. If they occur in the neck or the brain, a person may have a stroke. Intermittent claudication is when the blockage occurs in the legs, most commonly in the calf or the foot.  Atherosclerosis, or blockage of arteries, can occur for many reasons. Some of these are smoking, diabetes, and high cholesterol. SYMPTOMS  Intermittent claudication may occur in both legs, and it often continues to get worse over time. However, some people complain only of weakness in the legs when walking, or a feeling of "tiredness" in the buttocks. Impotence (not able to have an erection) is an occasional complaint in men. Pain while resting is uncommon.  WHAT TO EXPECT AT [facility] PROVIDER'S OFFICE: Your medical history will be asked for and a physical examination will be performed. Medical history questions documenting claudication in detail may include:   Time pattern  Do you have leg cramps at night (nocturnal cramps)?  How often does leg pain with cramping occur?  Is it getting worse?  What is the quality of the pain?  Is the pain sharp?  Is there an aching pain with the cramps?  Aggravating factors  Is it worse after you exercise?  Is it  worse after you are standing for a while?  Do you smoke? How much?  Do you drink alcohol? How much?  Are you diabetic? How well is your blood sugar controlled?  Other  What other symptoms are also present?  Has there been impotence (men)?  Is there pain in the back?  Is there a darkening of the skin of the legs, feet or toes?  Is there weakness or paralysis of the legs? The physical examination may include evaluation of the femoral pulse (in the groin) and the other areas where the pulse can be felt in the legs. DIAGNOSIS  Diagnostic tests that may be performed include:  Blood pressure measured in arms and legs for comparison.  Doppler ultrasonography on the legs and the heart.  Duplex Doppler/ultrasound exam of extremity to visualize arterial blood flow.  ECG- to evaluate the activity of your heart.  Aortography- to visualize blockages in your arteries. TREATMENT Surgical treatment may be suggested if claudication interferes with the patient's activities or work, and if the diseased arteries do not seem to be improving after treatment. Be aware that this condition can worsen over time and you should carefully monitor your condition. HOME CARE INSTRUCTIONS  Talk to your caregiver about the cause of your leg cramping and about what to do at home to relieve it.  A healthy diet is important to lessen the likeliness of atherosclerosis.  A program of daily walking for short periods, and stopping for pain or cramping, may help improve function.  It is important to  stop smoking.  Avoid putting hot or cold items on legs.  Avoid tight shoes. SEEK MEDICAL CARE IF: There are many other causes of leg pain such as arthritis or low blood potassium. However, some causes of leg pain may be life threatening such as a blood clot in the legs. Seek medical attention if you have:  Leg pain that does not go away.  Legs that may be red, hot or swollen.  Ulcers or sores appear on your  ankle or foot.  Any chest pain or shortness of breath accompanying leg pain.  Diabetes.  You are pregnant. SEEK IMMEDIATE MEDICAL CARE IF:   Your leg pain becomes severe or will not go away.  Your foot turns blue or a dark color.  Your leg becomes red, hot or swollen or you develop a fever over 102F.  Any chest pain or shortness of breath accompanying leg pain. MAKE SURE YOU:   Understand these instructions.  Will watch your condition.  Will get help right away if you are not doing well or get worse. Document Released: 02/26/2004 Document Revised: 07/18/2011 Document Reviewed: 08/01/2013 [facility] Patient Information 2015 [place], [place]. This information is not intended to replace advice given to you by your health care provider. Make sure you discuss any questions you have with your health care provider.

## 2014-04-27 NOTE — Progress Notes (Signed)
[facility] [address], [place] [address] [phone]  Date:  04/27/2014   Name:  Jesus [name]   DOB:  1955-08-19   MRN:  [id number]  PCP:  Shirline Frees, MD    Chief Complaint: Back Pain   History of Present Illness:  Jesus [name] is a 58 y.o. very pleasant male patient who presents with the following:  Patient is a smoker with HLD and HBP and no history of NIDDM.  Has pain in left leg to knee "a couple years".  No night ore rest pain. pain.  Pain increases with walking.  Vague historian certainly could represent claudication. Now has pain past week in right thigh. No history of overuse or injury. Intermittent left low back pain. No chest pain.  Exertional shortness of breath No back pain today No improvement with over the counter medications or other home remedies.  Denies other complaint or health concern today.   There are no active problems to display for this patient.   Past Medical History  Diagnosis Date  . Hypertension   . Wears glasses   . Snores   . Allergy     Past Surgical History  Procedure Laterality Date  . Orif foot fracture  2005    left  . Hand arthroplasty  1995    crushed left hand  . Colonoscopy    . Microlaryngoscopy Left 01/17/2014    Procedure: MICROLARYNGOSCOPY WITH EXCISION OF THE BIOPSY OF LEFT VOCAL CORD LESION;  Surgeon: Izora Gala, MD;  Location: [place];  Service: ENT;  Laterality: Left;    History  Substance Use Topics  . Smoking status: Current Every Day Smoker -- 1.00 packs/day  . Smokeless tobacco: Never Used  . Alcohol Use: 0.0 oz/week    0 Not specified per week     Comment: occ    Family History  Problem Relation Age of Onset  . Cancer Mother   . Diabetes Father   . Hypertension Father   . Stroke Father   . Mental illness Sister   . Hypertension Daughter   . Mental illness Daughter     No Known Allergies  Medication list has been reviewed and  updated.  Current Outpatient Prescriptions on File Prior to Visit  Medication Sig Dispense Refill  . amLODipine (NORVASC) 5 MG tablet Take 5 mg by mouth daily.    Marland Kitchen aspirin 81 MG tablet Take 81 mg by mouth daily.    Marland Kitchen lisinopril (PRINIVIL,ZESTRIL) 20 MG tablet Take 20 mg by mouth daily.    . Multiple Vitamins-Minerals (MULTIVITAMIN WITH MINERALS) tablet Take 1 tablet by mouth daily.    . tadalafil (CIALIS) 10 MG tablet Take 10 mg by mouth daily as needed for erectile dysfunction.     No current facility-administered medications on file prior to visit.    Review of Systems:  As per HPI, otherwise negative.    Physical Examination: Filed Vitals:   04/27/14 1433  BP: 148/84  Pulse: 72  Temp: 98.1 F (36.7 C)  Resp: 20   Filed Vitals:   04/27/14 1433  Height: 5' 11"  (1.803 m)  Weight: 292 lb 2 oz (132.507 kg)   Body mass index is 40.76 kg/(m^2). Ideal Body Weight: Weight in (lb) to have BMI = 25: 178.9  GEN: morbid obesity, NAD, Non-toxic, A & O x 3 HEENT: Atraumatic, Normocephalic. Neck supple. No masses, No LAD. Ears and Nose: No external deformity. CV: RRR, No M/G/R.  No JVD. No thrill. No extra heart sounds.  Bilateral femoral absent.  Bilateral PT absent.  Left DP +3,  Right  +2 PULM: CTA B, no wheezes, crackles, rhonchi. No retractions. No resp. distress. No accessory muscle use. ABD: S, NT, ND, +BS. No rebound. No HSM. EXTR: No c/c/e  Bilateral onychogyhosis NEURO Normal gait.  PSYCH: Normally interactive. Conversant. Not depressed or anxious appearing.  Calm demeanor.    Assessment and Plan: Claudication Morbid obesity Smoker chantix   Signed,  Ellison Carwin, MD   Results for orders placed or performed in visit on 04/27/14  POCT glycosylated hemoglobin (Hb A1C)  Result Value Ref Range   Hemoglobin A1C 5.4

## 2014-04-29 ENCOUNTER — Telehealth: Payer: Self-pay | Admitting: Emergency Medicine

## 2014-04-29 ENCOUNTER — Ambulatory Visit (HOSPITAL_COMMUNITY)
Admission: RE | Admit: 2014-04-29 | Discharge: 2014-04-29 | Disposition: A | Discharge status: Home / Self Care | Payer: 59 | Source: Ambulatory Visit | Attending: Emergency Medicine | Admitting: Emergency Medicine

## 2014-04-29 ENCOUNTER — Ambulatory Visit (INDEPENDENT_AMBULATORY_CARE_PROVIDER_SITE_OTHER): Payer: 59

## 2014-04-29 ENCOUNTER — Other Ambulatory Visit: Payer: Self-pay | Admitting: Emergency Medicine

## 2014-04-29 ENCOUNTER — Ambulatory Visit (INDEPENDENT_AMBULATORY_CARE_PROVIDER_SITE_OTHER): Payer: 59 | Admitting: Family Medicine

## 2014-04-29 VITALS — BP 130/90 | HR 73 | Temp 98.0°F | Resp 16 | Ht 70.0 in | Wt 290.0 lb

## 2014-04-29 DIAGNOSIS — M79651 Pain in right thigh: Secondary | ICD-10-CM

## 2014-04-29 DIAGNOSIS — B351 Tinea unguium: Secondary | ICD-10-CM

## 2014-04-29 DIAGNOSIS — I70219 Atherosclerosis of native arteries of extremities with intermittent claudication, unspecified extremity: Secondary | ICD-10-CM

## 2014-04-29 DIAGNOSIS — M25569 Pain in unspecified knee: Secondary | ICD-10-CM

## 2014-04-29 DIAGNOSIS — R7309 Other abnormal glucose: Secondary | ICD-10-CM | POA: Diagnosis present

## 2014-04-29 IMAGING — CR DG FEMUR 2+V*R*
3 series · 3 of 3 positions shown · non-contrast
Comparison: None.

CLINICAL DATA: Right leg pain for 4 days, no trauma

EXAM:
RIGHT FEMUR - 2 VIEW

[AP (1 of 2)]
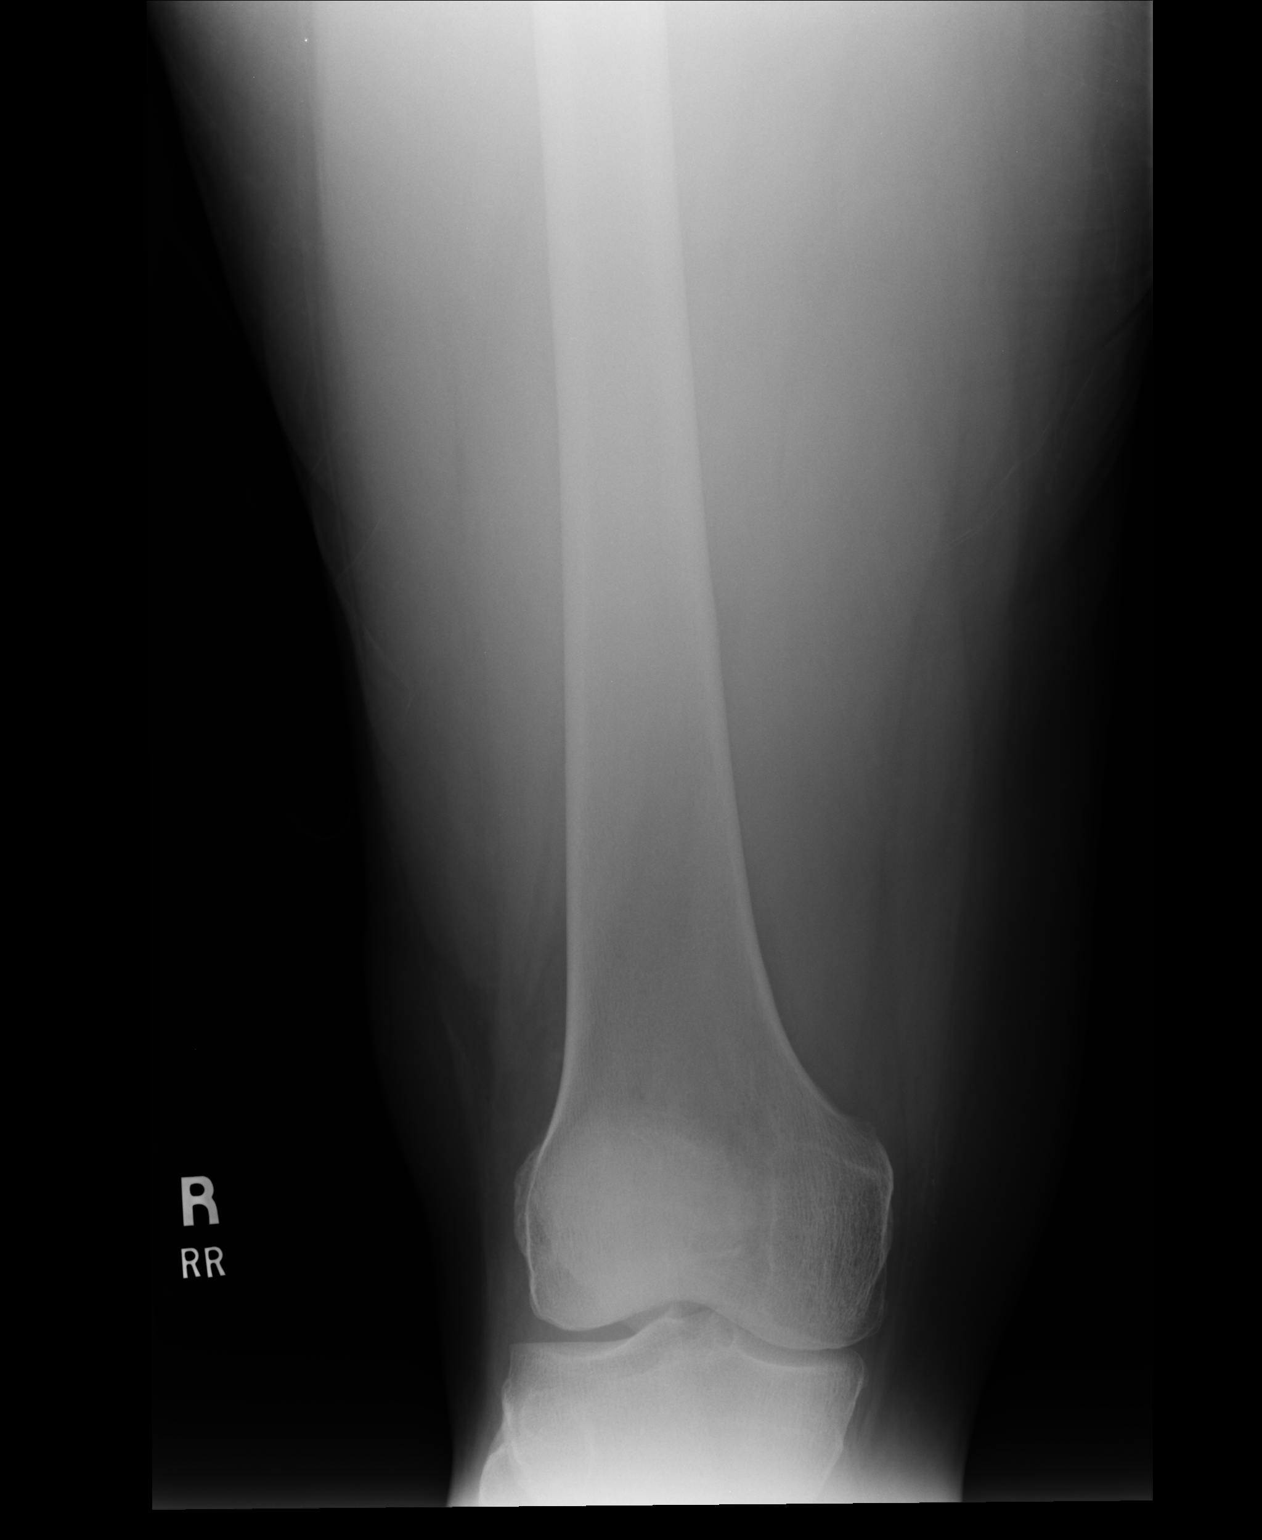

[AP (2 of 2)]
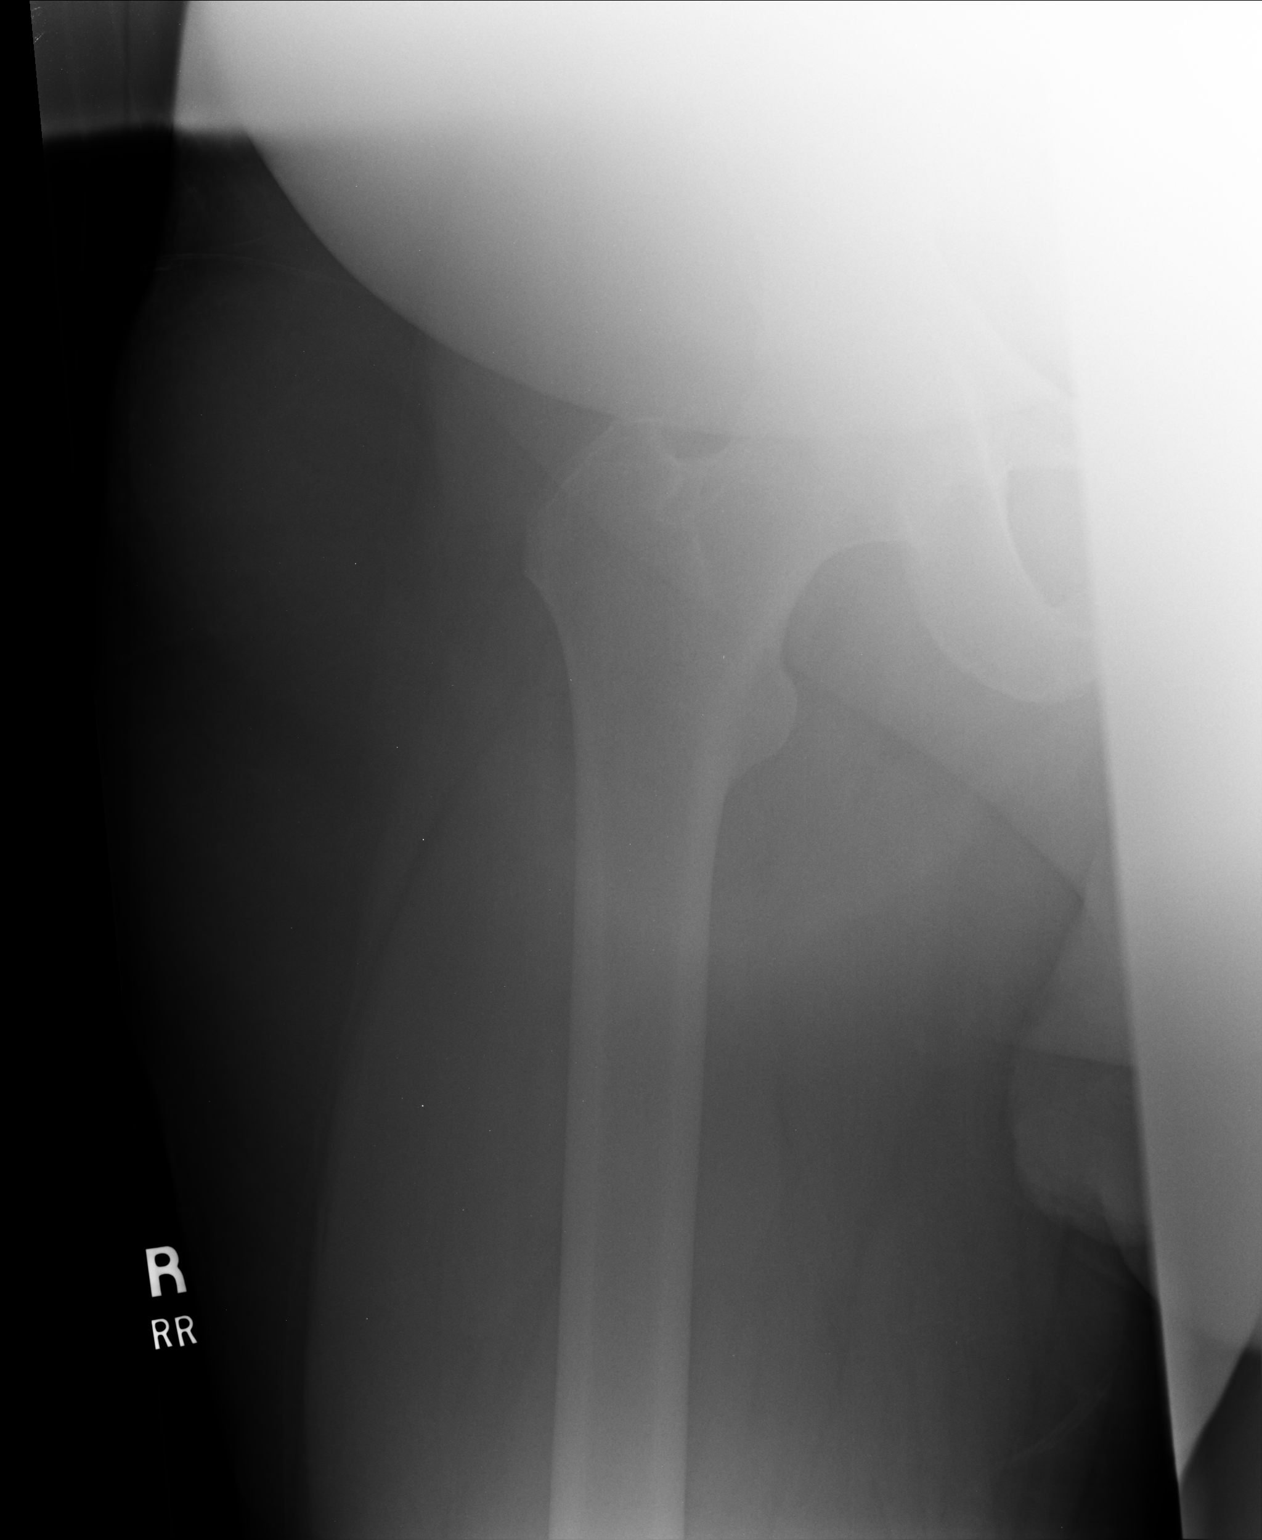

[lateral]
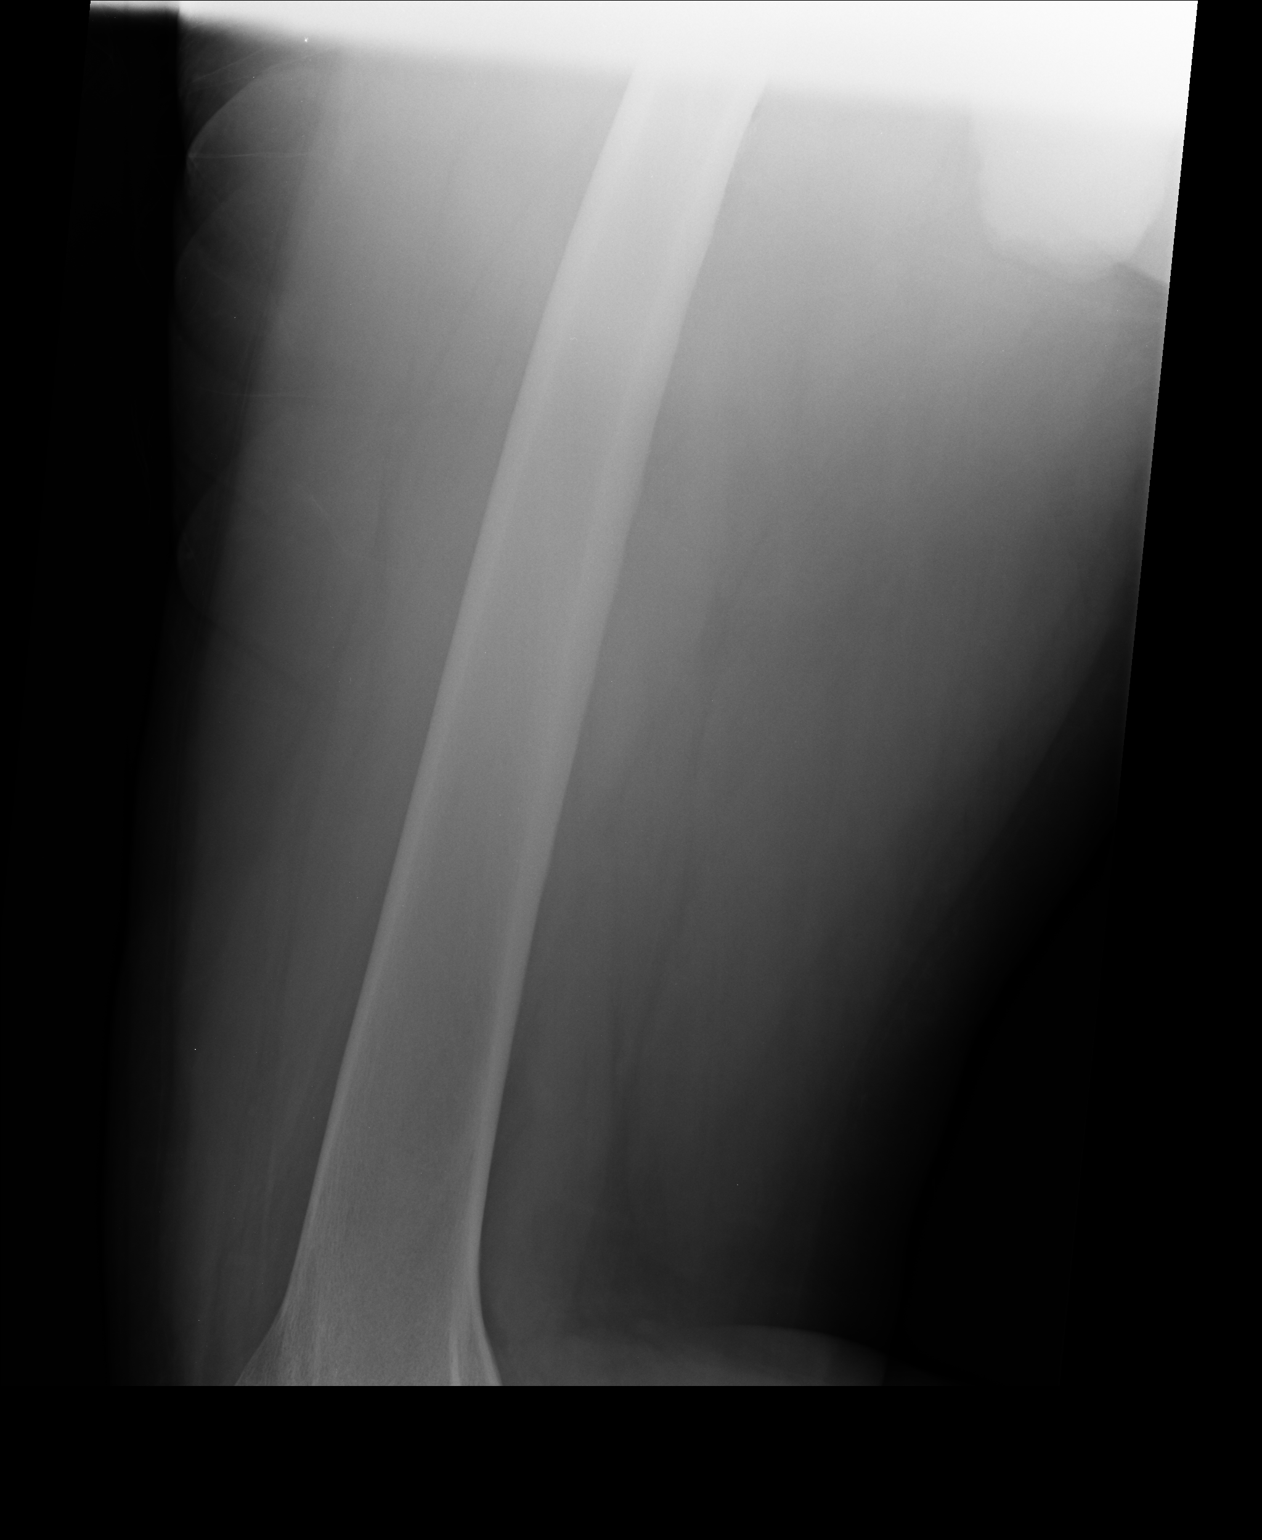

[3 of 3 positions shown; findings below may reference images not displayed]

FINDINGS: There is no evidence of fracture or other focal bone lesions. Soft
tissues are unremarkable. Exam detail is suboptimal due to patient
body habitus.
IMPRESSION: Negative.

## 2014-04-29 MED ORDER — CYCLOBENZAPRINE HCL 5 MG PO TABS: ORAL_TABLET | ORAL | Status: DC

## 2014-04-29 MED ORDER — OXYCODONE-ACETAMINOPHEN 5-325 MG PO TABS: 1.0000 | ORAL_TABLET | Freq: Three times a day (TID) | ORAL | Status: DC | PRN

## 2014-04-29 MED ORDER — MELOXICAM 15 MG PO TABS: 15.0000 mg | ORAL_TABLET | Freq: Every day | ORAL | Status: DC

## 2014-04-29 NOTE — Telephone Encounter (Signed)
Patient states that he is still having severe pain in his legs and the medication he was given to help with that isn't working well. Can he have something else?  [phone]

## 2014-04-29 NOTE — Progress Notes (Signed)
VASCULAR LAB PRELIMINARY  ARTERIAL  ABI completed:    RIGHT    LEFT    PRESSURE WAVEFORM  PRESSURE WAVEFORM  BRACHIAL 166 triphasic BRACHIAL 172 triphasic  DP 186  DP 244   AT   AT    PT 194 triphasic PT 222 triphasic  PER   PER    GREAT TOE  normal GREAT TOE  normal    RIGHT LEFT  ABI 1.13 1.42   Normal ABIs and pedal waveforms.  Dexter Signor, RVT 04/29/2014, 11:48 AM

## 2014-04-29 NOTE — Telephone Encounter (Signed)
Patient is calling back - severe pain in legs.  Needs medication or to go to ER  [phone]

## 2014-04-29 NOTE — Telephone Encounter (Signed)
He should come in and be seen or go to the ER

## 2014-04-29 NOTE — Progress Notes (Signed)
Chief Complaint:  Chief Complaint  Patient presents with  . Leg Pain    right leg    HPI: Jesus [name] is a 58 y.o. male who is here for  bialteral leg pain, right greater than left Standing and sitting and walking , he works as machine , he push buttons, has had pain in his legs bilaterally, NKI His legs hurt like this when he goes to the dentist, when they elevate his legs.  Started out as back pain and now has bilateral leg pain, he stood in line at [employer] for 2 hours.  He had vascular eval and was negative for claudication He has full control of his extremities but legs just hurt.  He has numbness and tingling, the bottom of his feeet would tingl but not in his legs.  He has pain in his leg , he has constant throbbing pain in his leg 20/10. It is constant and steady since Saturday.  He denies any hip pain or knee pain. NKI.   He had flu vaccine 2 weeks ago. He gets the flu vaccine all the time without complications.   Past Medical History  Diagnosis Date  . Hypertension   . Wears glasses   . Snores   . Allergy    Past Surgical History  Procedure Laterality Date  . Orif foot fracture  2005    left  . Hand arthroplasty  1995    crushed left hand  . Colonoscopy    . Microlaryngoscopy Left 01/17/2014    Procedure: MICROLARYNGOSCOPY WITH EXCISION OF THE BIOPSY OF LEFT VOCAL CORD LESION;  Surgeon: Izora Gala, MD;  Location: [facility];  Service: ENT;  Laterality: Left;   History   Social History  . Marital Status: Single    Spouse Name: N/A    Number of Children: N/A  . Years of Education: N/A   Social History Main Topics  . Smoking status: Current Every Day Smoker -- 1.00 packs/day  . Smokeless tobacco: Never Used  . Alcohol Use: 0.0 oz/week    0 Not specified per week     Comment: occ  . Drug Use: Yes    Special: Marijuana  . Sexual Activity: None   Other Topics Concern  . None   Social History Narrative   Family  History  Problem Relation Age of Onset  . Cancer Mother   . Diabetes Father   . Hypertension Father   . Stroke Father   . Mental illness Sister   . Hypertension Daughter   . Mental illness Daughter    No Known Allergies Prior to Admission medications   Medication Sig Start Date End Date Taking? Authorizing Provider  amLODipine (NORVASC) 5 MG tablet Take 5 mg by mouth daily.   Yes Historical Provider, MD  aspirin 81 MG tablet Take 81 mg by mouth daily.   Yes Historical Provider, MD  buPROPion (WELLBUTRIN) 100 MG tablet Take 1 tablet (100 mg total) by mouth 2 (two) times daily. 04/27/14  Yes Roselee Culver, MD  lisinopril (PRINIVIL,ZESTRIL) 20 MG tablet Take 20 mg by mouth daily.   Yes Historical Provider, MD  Multiple Vitamins-Minerals (MULTIVITAMIN WITH MINERALS) tablet Take 1 tablet by mouth daily.   Yes Historical Provider, MD  tadalafil (CIALIS) 10 MG tablet Take 10 mg by mouth daily as needed for erectile dysfunction.   Yes Historical Provider, MD  traMADol (ULTRAM) 50 MG tablet Take 1 tablet (50 mg total)  by mouth every 8 (eight) hours as needed. 04/27/14  Yes Roselee Culver, MD     ROS: The patient denies fevers, chills, night sweats, unintentional weight loss, chest pain, palpitations, wheezing, dyspnea on exertion, nausea, vomiting, abdominal pain, dysuria, hematuria, melena, weakness  All other systems have been reviewed and were otherwise negative with the exception of those mentioned in the HPI and as above.    PHYSICAL EXAM: Filed Vitals:   04/29/14 1838  BP: 130/90  Pulse: 73  Temp: 98 F (36.7 C)  Resp: 16   Filed Vitals:   04/29/14 1838  Height: 5' 10"  (1.778 m)  Weight: 290 lb (131.543 kg)   Body mass index is 41.61 kg/(m^2).  General: Alert, no acute distress HEENT:  Normocephalic, atraumatic, oropharynx patent. EOMI, PERRLA Cardiovascular:  Regular rate and rhythm, no rubs murmurs or gallops.  No Carotid bruits, radial pulse intact. No pedal  edema.  Respiratory: Clear to auscultation bilaterally.  No wheezes, rales, or rhonchi.  No cyanosis, no use of accessory musculature GI: No organomegaly, abdomen is soft and non-tender, positive bowel sounds.  No masses. Skin: No rashes. Neurologic: Facial musculature symmetric. Psychiatric: Patient is appropriate throughout our interaction. Lymphatic: No cervical lymphadenopathy Musculoskeletal: Gait intact. + paramsk tenderness  Full ROM 5/5 strength, 2/2 DTRs No saddle anesthesia Straight leg negative Hip and knee exam--normal    LABS: Results for orders placed or performed in visit on 04/27/14  POCT glycosylated hemoglobin (Hb A1C)  Result Value Ref Range   Hemoglobin A1C 5.4      EKG/XRAY:   Primary read interpreted by Dr. Marin Comment at [facility]. Neg for fx or dislocation  ASSESSMENT/PLAN: Encounter Diagnoses  Name Primary?  . Right thigh pain Yes  . Pain in joint, lower leg, unspecified laterality   . Onychomycosis    CMP pending sicne was on antigunfal x 3 mnths Rx mobic, roxicet and flexeril F/u prn   Gross sideeffects, risk and benefits, and alternatives of medications d/w patient. Patient is aware that all medications have potential sideeffects and we are unable to predict every sideeffect or drug-drug interaction that may occur.  Annaka Cleaver, [place], DO 04/29/2014 7:56 PM

## 2014-04-30 ENCOUNTER — Emergency Department (HOSPITAL_COMMUNITY)
Admission: EM | Admit: 2014-04-30 | Discharge: 2014-05-01 | Disposition: A | Discharge status: Home / Self Care | Payer: 59 | Attending: Emergency Medicine | Admitting: Emergency Medicine

## 2014-04-30 ENCOUNTER — Encounter (HOSPITAL_COMMUNITY): Payer: Self-pay | Admitting: *Deleted

## 2014-04-30 DIAGNOSIS — E669 Obesity, unspecified: Secondary | ICD-10-CM | POA: Diagnosis not present

## 2014-04-30 DIAGNOSIS — I10 Essential (primary) hypertension: Secondary | ICD-10-CM | POA: Diagnosis not present

## 2014-04-30 DIAGNOSIS — Z973 Presence of spectacles and contact lenses: Secondary | ICD-10-CM | POA: Diagnosis not present

## 2014-04-30 DIAGNOSIS — Z720 Tobacco use: Secondary | ICD-10-CM | POA: Insufficient documentation

## 2014-04-30 DIAGNOSIS — Z79899 Other long term (current) drug therapy: Secondary | ICD-10-CM | POA: Insufficient documentation

## 2014-04-30 DIAGNOSIS — M79604 Pain in right leg: Secondary | ICD-10-CM | POA: Insufficient documentation

## 2014-04-30 DIAGNOSIS — Z7982 Long term (current) use of aspirin: Secondary | ICD-10-CM | POA: Diagnosis not present

## 2014-04-30 LAB — COMPLETE METABOLIC PANEL WITHOUT GFR
AST: 14 U/L (ref 0–37)
Alkaline Phosphatase: 56 U/L (ref 39–117)
CO2: 27 meq/L (ref 19–32)
Calcium: 9.7 mg/dL (ref 8.4–10.5)
Chloride: 105 meq/L (ref 96–112)
Creat: 1.09 mg/dL (ref 0.50–1.35)
Total Protein: 7.2 g/dL (ref 6.0–8.3)

## 2014-04-30 LAB — COMPLETE METABOLIC PANEL WITH GFR
ALT: 11 U/L (ref 0–53)
Albumin: 4.5 g/dL (ref 3.5–5.2)
BUN: 20 mg/dL (ref 6–23)
GFR, Est African American: 86 mL/min
GFR, Est Non African American: 74 mL/min
Glucose, Bld: 105 mg/dL — ABNORMAL HIGH (ref 70–99)
Potassium: 4.2 mEq/L (ref 3.5–5.3)
Sodium: 141 mEq/L (ref 135–145)
Total Bilirubin: 0.5 mg/dL (ref 0.2–1.2)

## 2014-04-30 NOTE — ED Provider Notes (Signed)
CSN: [id number]     Arrival date & time 04/30/14  2336 History  This chart was scribed for non-physician practitioner, Quincy Carnes, PA-C,working with Wynetta Fines, MD, by Marlowe Kays, ED Scribe. This patient was seen in room WTR5/WTR5 and the patient's care was started at 11:58 PM.  Chief Complaint  Patient presents with  . Leg Pain   Patient is a 58 y.o. male presenting with leg pain. The history is provided by the patient. No language interpreter was used.  Leg Pain Associated symptoms: no fever     HPI Comments:  Jesus [name] is a 58 y.o. obese male with PMH of HTN who presents to the Emergency Department complaining of new onset, constant severe anterior right thigh pain that began six days ago. He reports he has been seen at urgent care twice and received Meloxicam, Percocet and Flexeril that has not given him any relief. He reports he has also taken Tramadol without relief. He has had x-rays and ABI's of bilateral lower extremities. He has not been checked for blood clots.  He has no prior hx of DVT or PE.  He states he has had pain in his left leg as well but it has since resolved. Pt denies any alleviating factors. Walking makes the pain worse. Denies trauma, injury or fall. Denies fever, chills, nausea, vomiting, numbness, tingling or weakness of the lower extremities.  States his leg is not "tender to the touch" but "hurts on the inside".  VS stable on arrival.  Past Medical History  Diagnosis Date  . Hypertension   . Wears glasses   . Snores   . Allergy    Past Surgical History  Procedure Laterality Date  . Orif foot fracture  2005    left  . Hand arthroplasty  1995    crushed left hand  . Colonoscopy    . Microlaryngoscopy Left 01/17/2014    Procedure: MICROLARYNGOSCOPY WITH EXCISION OF THE BIOPSY OF LEFT VOCAL CORD LESION;  Surgeon: Izora Gala, MD;  Location: [place];  Service: ENT;  Laterality: Left;   Family History  Problem Relation Age of  Onset  . Cancer Mother   . Diabetes Father   . Hypertension Father   . Stroke Father   . Mental illness Sister   . Hypertension Daughter   . Mental illness Daughter    History  Substance Use Topics  . Smoking status: Current Every Day Smoker -- 1.00 packs/day  . Smokeless tobacco: Never Used  . Alcohol Use: 0.0 oz/week    0 Not specified per week     Comment: occ    Review of Systems  Constitutional: Negative for fever and chills.  Gastrointestinal: Negative for nausea and vomiting.  Musculoskeletal: Positive for arthralgias.  Skin: Negative for color change.  Neurological: Negative for weakness and numbness.  All other systems reviewed and are negative.   Allergies  Review of patient's allergies indicates no known allergies.  Home Medications   Prior to Admission medications   Medication Sig Start Date End Date Taking? Authorizing Provider  amLODipine (NORVASC) 5 MG tablet Take 5 mg by mouth daily.    Historical Provider, MD  aspirin 81 MG tablet Take 81 mg by mouth daily.    Historical Provider, MD  buPROPion (WELLBUTRIN) 100 MG tablet Take 1 tablet (100 mg total) by mouth 2 (two) times daily. 04/27/14   Roselee Culver, MD  cyclobenzaprine (FLEXERIL) 5 MG tablet Take 1-2 tabs po qhs prn  04/29/14   Thao P Le, DO  lisinopril (PRINIVIL,ZESTRIL) 20 MG tablet Take 20 mg by mouth daily.    Historical Provider, MD  meloxicam (MOBIC) 15 MG tablet Take 1 tablet (15 mg total) by mouth daily. Take with food, no other NSAIDs, 1 pill max per day 04/29/14   Thao P Le, DO  Multiple Vitamins-Minerals (MULTIVITAMIN WITH MINERALS) tablet Take 1 tablet by mouth daily.    Historical Provider, MD  oxyCODONE-acetaminophen (ROXICET) 5-325 MG per tablet Take 1 tablet by mouth every 8 (eight) hours as needed for severe pain. Do not take extra tylenol with this medicine, take with stool softner 04/29/14   Thao P Le, DO  tadalafil (CIALIS) 10 MG tablet Take 10 mg by mouth daily as needed for  erectile dysfunction.    Historical Provider, MD   Triage Vitals: BP 161/97 mmHg  Pulse 87  Temp(Src) 97.1 F (36.2 C) (Oral)  Resp 16  SpO2 98% Physical Exam  Constitutional: He is oriented to person, place, and time. He appears well-developed and well-nourished.  HENT:  Head: Normocephalic and atraumatic.  Mouth/Throat: Oropharynx is clear and moist.  Eyes: Conjunctivae and EOM are normal. Pupils are equal, round, and reactive to light.  Neck: Normal range of motion.  Cardiovascular: Normal rate, regular rhythm and normal heart sounds.   Pulmonary/Chest: Effort normal and breath sounds normal. No respiratory distress. He has no wheezes.  Abdominal: Soft. Bowel sounds are normal.  Musculoskeletal: Normal range of motion.  Right thigh normal in appearance without bony deformities; no overlying erythema, warmth to touch, or open lesions; full range of motion of right hip, knee, and ankle without difficulty; DP pulse intact; normal strength and sensation throughout leg  Neurological: He is alert and oriented to person, place, and time.  Skin: Skin is warm and dry. No rash noted.  Psychiatric: He has a normal mood and affect.  Nursing note and vitals reviewed.   ED Course  Procedures (including critical care time) DIAGNOSTIC STUDIES: Oxygen Saturation is 98% on RA, normal by my interpretation.   COORDINATION OF CARE: 12:05 AM- Will put in order for Doppler and instructed pt that he could go have it done anytime before 7 PM tomorrow. Pt verbalizes understanding and agrees to plan.  Medications - No data to display  Labs Review Labs Reviewed - No data to display  Imaging Review Dg Femur Right  04/29/2014   CLINICAL DATA:  Right leg pain for 4 days, no trauma  EXAM: RIGHT FEMUR - 2 VIEW  COMPARISON:  None.  FINDINGS: There is no evidence of fracture or other focal bone lesions. Soft tissues are unremarkable. Exam detail is suboptimal due to patient body habitus.  IMPRESSION:  Negative.   Electronically Signed   By: Conchita Paris M.D.   On: 04/29/2014 20:24     EKG Interpretation None      MDM   Final diagnoses:  Right leg pain   58 year old male with ongoing right thigh pain for the past 6 days. He has been seen in urgent care twice for this with negative plain films and ABIs. He has tried multiple pain medications without relief. On exam, there are no bony deformities, abscess, open wounds, or visible signs of trauma.  There is normal muscle tone and leg remains neurovascular intact.  Unsure of exact etiology of patient's pain at this time.  Will schedule for OP duplex in the morning to r/o DVT as unavailable at this time.  Recommended close FU  with PCP.  Discussed plan with patient, he/she acknowledged understanding and agreed with plan of care.  Return precautions given for new or worsening symptoms.  I personally performed the services described in this documentation, which was scribed in my presence. The recorded information has been reviewed and is accurate.  Larene Pickett, PA-C 05/01/14 [place], MD 05/01/14 [phone]

## 2014-04-30 NOTE — ED Notes (Signed)
Pt reports R thigh pain since Saturday.  Has been seen at [facility] twice and was given pain med, and muscle relaxers without relief.  Pt reports Saturday he went out shopping with his daughter and was standing for 2 hours straight.  Started to have back pain and bila leg pain which went away, but the pain in his R thigh never got better.  Pt also reports having a "vascular" check and was negative.

## 2014-04-30 NOTE — Telephone Encounter (Signed)
Pt rtn to clinic 04/29/14

## 2014-05-01 ENCOUNTER — Ambulatory Visit (HOSPITAL_COMMUNITY)
Admission: RE | Admit: 2014-05-01 | Discharge: 2014-05-01 | Disposition: A | Discharge status: Home / Self Care | Payer: 59 | Source: Ambulatory Visit | Attending: Family Medicine | Admitting: Family Medicine

## 2014-05-01 DIAGNOSIS — M79606 Pain in leg, unspecified: Secondary | ICD-10-CM | POA: Insufficient documentation

## 2014-05-01 DIAGNOSIS — M79609 Pain in unspecified limb: Secondary | ICD-10-CM

## 2014-05-01 NOTE — Discharge Instructions (Signed)
May continue home pain medications as needed. For doppler-- go to [facility] [place] radiology department between 8am-7pm. Recommend close follow-up with your primary care physician. Return to the ED for new concerns.

## 2014-05-01 NOTE — Progress Notes (Addendum)
*  Preliminary Results* Right lower extremity venous duplex completed. Right lower extremity is negative for deep vein thrombosis. There is no evidence of right Baker's cyst.  05/01/2014 5:45 PM  Maudry Mayhew, RVT, RDCS, RDMS

## 2014-05-05 ENCOUNTER — Telehealth: Payer: Self-pay

## 2014-05-05 DIAGNOSIS — M79605 Pain in left leg: Principal | ICD-10-CM

## 2014-05-05 DIAGNOSIS — M79604 Pain in right leg: Secondary | ICD-10-CM

## 2014-05-05 DIAGNOSIS — M5430 Sciatica, unspecified side: Secondary | ICD-10-CM

## 2014-05-05 NOTE — Telephone Encounter (Signed)
Pt is needing for clinical to call and see if his appt can be moved up with vascular and vein because he is in a lot of pain and his appt is on 05/27/14 at 900

## 2014-05-06 NOTE — Telephone Encounter (Signed)
Beth called back and that is the soonest appt they are able to get him into.  They are confident that it is ok to wait until this date due to his negative imaging studies- they will put him on the wait list and call him if any cancellations occur.

## 2014-05-06 NOTE — Telephone Encounter (Signed)
Pt has been advised.

## 2014-05-06 NOTE — Telephone Encounter (Signed)
Had to leave a message with Triage nurse at [facility] [phone] to try to get appt moved up.

## 2014-05-07 ENCOUNTER — Encounter: Payer: Self-pay | Admitting: Vascular Surgery

## 2014-05-12 ENCOUNTER — Encounter (HOSPITAL_COMMUNITY): Payer: 59

## 2014-05-14 ENCOUNTER — Telehealth: Payer: Self-pay

## 2014-05-14 NOTE — Telephone Encounter (Signed)
Pt states he needs a refill on oxyCODONE-acetaminophen (ROXICET) 5-325 MG per tablet [[id number] and cyclobenzaprine (FLEXERIL) 5 MG tablet [[id number]. He was referred to another Doc, but can't be seen till the 19th. He is having a hard time walking. Please advise at [phone]

## 2014-05-16 ENCOUNTER — Other Ambulatory Visit: Payer: Self-pay | Admitting: Family Medicine

## 2014-05-16 DIAGNOSIS — G894 Chronic pain syndrome: Secondary | ICD-10-CM

## 2014-05-16 MED ORDER — CYCLOBENZAPRINE HCL 5 MG PO TABS: ORAL_TABLET | ORAL | Status: DC

## 2014-05-16 MED ORDER — OXYCODONE-ACETAMINOPHEN 5-325 MG PO TABS: 1.0000 | ORAL_TABLET | Freq: Three times a day (TID) | ORAL | Status: DC | PRN

## 2014-05-16 NOTE — Telephone Encounter (Signed)
Lmom that rx is ready for pickup

## 2014-05-16 NOTE — Telephone Encounter (Signed)
He can pick it up at 102 , flexeril has been sent in, he can pick up roxicet rx up front.

## 2014-05-26 ENCOUNTER — Encounter: Payer: Self-pay | Admitting: Vascular Surgery

## 2014-05-27 ENCOUNTER — Encounter: Payer: Self-pay | Admitting: Family Medicine

## 2014-05-27 ENCOUNTER — Ambulatory Visit (INDEPENDENT_AMBULATORY_CARE_PROVIDER_SITE_OTHER): Payer: 59 | Admitting: Vascular Surgery

## 2014-05-27 ENCOUNTER — Encounter: Payer: Self-pay | Admitting: Vascular Surgery

## 2014-05-27 VITALS — BP 147/85 | HR 72 | Ht 70.0 in | Wt 296.0 lb

## 2014-05-27 DIAGNOSIS — M79605 Pain in left leg: Secondary | ICD-10-CM

## 2014-05-27 DIAGNOSIS — M79604 Pain in right leg: Secondary | ICD-10-CM | POA: Insufficient documentation

## 2014-05-27 NOTE — Telephone Encounter (Signed)
Pt advised.

## 2014-05-27 NOTE — Progress Notes (Signed)
Subjective:     Patient ID: Jesus [name], male   DOB: 1955-10-02, 59 y.o.   MRN: [id number]  HPI this 59 year old male is referred for evaluation of bilateral leg pain he describes it as a severe shooting and sometimes numbing pain mostly in his thigh areas. This occurs while at rest and also with ambulation and is usually relieved somewhat by sitting. He does have a history of seeing a chiropractor with back adjustments improving these symptoms. He has no history of degenerative disc disease specifically. He does have a history of tobacco abuse smoking a pack of cigarettes per day for 38 years. He denies a history of infection gangrene cellulitis or nonhealing ulcers. 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 He states he has had the symptoms for many years but they have worsened recently.  Past Medical History  Diagnosis Date  . Hypertension   . Wears glasses   . Snores   . Allergy   . Pain, lower leg     Bilateral  . Peripheral arterial disease   . Hyperlipidemia     History  Substance Use Topics  . Smoking status: Current Every Day Smoker -- 1.00 packs/day    Types: Cigarettes  . Smokeless tobacco: Never Used     Comment: pt states that he will quit 05/30/2013  . Alcohol Use: 9.0 oz/week    10 Cans of beer, 5 Shots of liquor per week     Comment: occ    Family History  Problem Relation Age of Onset  . Cancer Mother   . Diabetes Father   . Hypertension Father   . Stroke Father   . Mental illness Sister   . Hypertension Daughter   . Mental illness Daughter   . Hypertension Brother     No Known Allergies   Current outpatient prescriptions:  .  amLODipine (NORVASC) 5 MG tablet, Take 5 mg by mouth 2 (two) times daily. , Disp: , Rfl:  .  aspirin 81 MG tablet, Take 81 mg by mouth daily., Disp: , Rfl:  .  buPROPion (WELLBUTRIN) 100 MG tablet, Take 1 tablet (100 mg total) by mouth 2 (two)  times daily., Disp: 60 tablet, Rfl: 5 .  CIALIS 20 MG tablet, , Disp: , Rfl: 0 .  cyclobenzaprine (FLEXERIL) 5 MG tablet, Take 1-2 tabs po qhs prn, Disp: 30 tablet, Rfl: 0 .  lisinopril (PRINIVIL,ZESTRIL) 20 MG tablet, Take 20 mg by mouth daily., Disp: , Rfl:  .  meloxicam (MOBIC) 15 MG tablet, Take 1 tablet (15 mg total) by mouth daily. Take with food, no other NSAIDs, 1 pill max per day, Disp: 30 tablet, Rfl: 0 .  Multiple Vitamins-Minerals (MULTIVITAMIN WITH MINERALS) tablet, Take 1 tablet by mouth daily., Disp: , Rfl:  .  oxyCODONE-acetaminophen (ROXICET) 5-325 MG per tablet, Take 1 tablet by mouth every 8 (eight) hours as needed for severe pain. Do not take extra tylenol with this medicine, take with stool softner, Disp: 30 tablet, Rfl: 0 .  tadalafil (CIALIS) 10 MG tablet, Take 10 mg by mouth daily as needed for erectile dysfunction., Disp: , Rfl:   BP 147/85 mmHg  Pulse 72  Ht 5' 10"  (1.778 m)  Wt 296 lb (134.265 kg)  BMI 42.47 kg/m2  SpO2 98%  Body mass index is 42.47 kg/(m^2).           Review of Systems denies  chest pain but does have dyspnea on exertion. Has history of some bulging veins. Complains of skin rashes and some numbness in the arms and legs. Also has history of premalignant polyp removed from vocal cords. Other systems negative and a complete review of systems     Objective:   Physical Exam BP 147/85 mmHg  Pulse 72  Ht 5' 10"  (1.778 m)  Wt 296 lb (134.265 kg)  BMI 42.47 kg/m2  SpO2 98%  Gen.-alert and oriented x3 in no apparent distress-obese HEENT normal for age Lungs no rhonchi or wheezing Cardiovascular regular rhythm no murmurs carotid pulses 3+ palpable no bruits audible Abdomen soft nontender no palpable masses Musculoskeletal free of  major deformities Skin clear -no rashes Neurologic normal Lower extremities 3+ femoral and dorsalis pedis and posterior tibial pulses palpable bilaterally with no edema  I reviewed the arterial and venous  studies which were performed December 2015. Patient had normal ABIs with triphasic flow in all arteries and no evidence of DVT.       Assessment:     #1 bilateral leg pain-most likely nerve compression pain #2 history of ongoing tobacco abuse #3 removal of premalignant polyp from vocal cord recently-following up with Dr. Constance Holster later this month    Plan:     No evidence of arterial or venous insufficiency to cause these symptoms Symptoms sound more like nerve compression Patient needs evaluation from a spine surgeon to see if he has nerve compression syndrome Strongly encouraged patient to discontinue smoking because of vocal cord lesion and general health issues Return to see me on when necessary basis

## 2014-05-27 NOTE — Telephone Encounter (Signed)
Patient called and says [facility] the dr. There told him he needs a referral for a spine dr for his sciatica nerve. Patient is requesting a referral for that. Please advise.   Best: [phone]

## 2014-05-27 NOTE — Telephone Encounter (Signed)
He does not need to return to the office, I have made the referral to see orthospine at [place], spine surgeon Dr Melina Schools. He has been a patient of [facility], saw Dr Onnie Graham in the past so I am referrring him there.   Thanks Dr Marin Comment

## 2014-05-27 NOTE — Telephone Encounter (Signed)
Please advise if pt needs to RTC for re evaluation or if a referral can be made for the spine specialist.

## 2014-08-17 ENCOUNTER — Ambulatory Visit (INDEPENDENT_AMBULATORY_CARE_PROVIDER_SITE_OTHER): Payer: 59 | Admitting: Family Medicine

## 2014-08-17 VITALS — BP 132/80 | HR 87 | Temp 98.2°F | Resp 17 | Ht 72.0 in | Wt 288.0 lb

## 2014-08-17 DIAGNOSIS — J029 Acute pharyngitis, unspecified: Secondary | ICD-10-CM

## 2014-08-17 DIAGNOSIS — L280 Lichen simplex chronicus: Secondary | ICD-10-CM

## 2014-08-17 MED ORDER — FLUOCINONIDE-E 0.05 % EX CREA: 1.0000 "application " | TOPICAL_CREAM | Freq: Two times a day (BID) | CUTANEOUS | Status: DC

## 2014-08-17 MED ORDER — AMOXICILLIN 875 MG PO TABS: 875.0000 mg | ORAL_TABLET | Freq: Two times a day (BID) | ORAL | Status: DC

## 2014-08-17 MED ORDER — LORCASERIN HCL 10 MG PO TABS: 10.0000 mg | ORAL_TABLET | Freq: Every day | ORAL | Status: DC

## 2014-08-17 NOTE — Progress Notes (Addendum)
° °  Subjective:  This chart was scribed for Robyn Haber, MD, by Starleen Arms, ED Scribe. This patient was seen in room Rm 4 and the patient's care was started at 1:25 PM.   Patient ID: Jesus [name], male    DOB: 17-Jan-1956, 60 y.o.   MRN: [id number] .cc HPI HPI Comments: Jesus [name] is a 59 y.o. male who presents to the Emergency Department complaining of a moderate sore throat, cough, and congestion onset four days ago.  Patient reports his son was seen for a sore throat and hemoptysis but no cough.  Patient denies fever.  NKA.  He also complains of an itching intermittent rash on his right arm onset 1 year ago with no previous history.  He has not used any topical treatments including cortisone for this complaint.  Patient is left-handed.    Patient reports he has worked at [employer] for 9 years.      Review of Systems  HENT: Positive for congestion and sore throat.   Respiratory: Positive for cough.        Objective:   Physical Exam  Constitutional: He is oriented to person, place, and time. He appears well-developed and well-nourished. No distress.  HENT:  Head: Normocephalic and atraumatic.  Eyes: Conjunctivae and EOM are normal.  Neck: Neck supple. No tracheal deviation present. No thyromegaly present.  Cardiovascular: Normal rate.   Pulmonary/Chest: Effort normal. No respiratory distress.  Musculoskeletal: Normal range of motion.  Lymphadenopathy:    He has no cervical adenopathy.  Neurological: He is alert and oriented to person, place, and time.  Skin: Skin is warm and dry.  Multiple excoriated papules over the right biceps area of the upper arm  Psychiatric: He has a normal mood and affect. His behavior is normal.  Nursing note and vitals reviewed.  Throat is red with no exudates     Assessment & Plan:  1:32 PM Will prescribe antibiotic and cortisone cream.  Patient acknowledges and agrees with plan.    This chart was scribed in my presence and  reviewed by me personally.    ICD-9-CM ICD-10-CM   1. Acute pharyngitis, unspecified pharyngitis type 462 J02.9 Culture, Group A Strep     amoxicillin (AMOXIL) 875 MG tablet  2. 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 Morbid obesity 278.01 E66.01 Lorcaserin HCl (BELVIQ) 10 MG TABS     Signed, Robyn Haber, MD

## 2014-08-17 NOTE — Addendum Note (Signed)
Addended by: Robyn Haber on: 08/17/2014 02:03 PM   Modules accepted: Orders

## 2014-08-17 NOTE — Patient Instructions (Signed)

## 2014-08-19 LAB — CULTURE, GROUP A STREP

## 2014-12-14 ENCOUNTER — Ambulatory Visit (INDEPENDENT_AMBULATORY_CARE_PROVIDER_SITE_OTHER): Payer: 59 | Admitting: Emergency Medicine

## 2014-12-14 VITALS — BP 130/80 | HR 82 | Temp 98.2°F | Resp 16 | Ht 72.0 in | Wt 288.0 lb

## 2014-12-14 DIAGNOSIS — H00016 Hordeolum externum left eye, unspecified eyelid: Secondary | ICD-10-CM | POA: Diagnosis not present

## 2014-12-14 MED ORDER — POLYMYXIN B-TRIMETHOPRIM 10000-0.1 UNIT/ML-% OP SOLN: 2.0000 [drp] | OPHTHALMIC | Status: DC

## 2014-12-14 NOTE — Patient Instructions (Signed)

## 2014-12-14 NOTE — Progress Notes (Signed)
Subjective:  Patient ID: Jesus [name], male    DOB: 1956/04/08  Age: 59 y.o. MRN: [id number]  CC: Stye   HPI CHAISE MAHABIR presents  with a stye on his left upper lid and present present for the past 4 weeks. Is getting larger. He denies any neurologic or visual symptoms. No drainage from his eye or drainage from the stye. He has a Network engineer.  History Damarie has a past medical history of Hypertension; Wears glasses; Snores; Allergy; Pain, lower leg; Peripheral arterial disease; and Hyperlipidemia.   He has past surgical history that includes ORIF foot fracture (2005); Hand Arthroplasty (1995); Colonoscopy; Microlaryngoscopy (Left, 01/17/2014); and Fracture surgery.   His  family history includes Cancer in his mother; Diabetes in his father; Hypertension in his brother, daughter, and father; Mental illness in his daughter and sister; Stroke in his father.  He   reports that he has been smoking Cigarettes.  He has a 18 pack-year smoking history. He has never used smokeless tobacco. He reports that he drinks about 9.0 oz of alcohol per week. He reports that he does not use illicit drugs.  Outpatient Prescriptions Prior to Visit  Medication Sig Dispense Refill  . amLODipine (NORVASC) 5 MG tablet Take 5 mg by mouth 2 (two) times daily.     Marland Kitchen CIALIS 20 MG tablet   0  . fluocinonide-emollient (LIDEX-E) 0.05 % cream Apply 1 application topically 2 (two) times daily. 30 g 0  . lisinopril (PRINIVIL,ZESTRIL) 20 MG tablet Take 20 mg by mouth daily.    . Lorcaserin HCl (BELVIQ) 10 MG TABS Take 10 mg by mouth daily. 30 tablet 3  . Multiple Vitamins-Minerals (MULTIVITAMIN WITH MINERALS) tablet Take 1 tablet by mouth daily.    Marland Kitchen aspirin 81 MG tablet Take 81 mg by mouth daily.    Marland Kitchen buPROPion (WELLBUTRIN) 100 MG tablet Take 1 tablet (100 mg total) by mouth 2 (two) times daily. (Patient not taking: Reported on 08/17/2014) 60 tablet 5  . meloxicam (MOBIC) 15 MG tablet Take 1 tablet (15 mg  total) by mouth daily. Take with food, no other NSAIDs, 1 pill max per day (Patient not taking: Reported on 12/14/2014) 30 tablet 0  . amoxicillin (AMOXIL) 875 MG tablet Take 1 tablet (875 mg total) by mouth 2 (two) times daily. 20 tablet 0   No facility-administered medications prior to visit.    History   Social History  . Marital Status: Single    Spouse Name: N/A  . Number of Children: N/A  . Years of Education: N/A   Social History Main Topics  . Smoking status: Current Every Day Smoker -- 0.50 packs/day for 36 years    Types: Cigarettes  . Smokeless tobacco: Never Used     Comment: pt states that he will quit 05/30/2013  . Alcohol Use: 9.0 oz/week    10 Cans of beer, 5 Shots of liquor per week     Comment: occ  . Drug Use: No  . Sexual Activity: Not on file   Other Topics Concern  . None   Social History Narrative     Review of Systems  Constitutional: Negative for fever, chills and appetite change.  HENT: Negative for congestion, ear pain, postnasal drip, sinus pressure and sore throat.   Eyes: Negative for pain and redness.  Respiratory: Negative for cough, shortness of breath and wheezing.   Cardiovascular: Negative for leg swelling.  Gastrointestinal: Negative for nausea, vomiting, abdominal pain, diarrhea, constipation and  blood in stool.  Endocrine: Negative for polyuria.  Genitourinary: Negative for dysuria, urgency, frequency and flank pain.  Musculoskeletal: Negative for gait problem.  Skin: Negative for rash.  Neurological: Negative for weakness and headaches.  Psychiatric/Behavioral: Negative for confusion and decreased concentration. The patient is not nervous/anxious.     Objective:  BP 130/80 mmHg  Pulse 82  Temp(Src) 98.2 F (36.8 C) (Oral)  Resp 16  Ht 6' (1.829 m)  Wt 288 lb (130.636 kg)  BMI 39.05 kg/m2  SpO2 97%  Physical Exam  Constitutional: He is oriented to person, place, and time. He appears well-developed and well-nourished.    HENT:  Head: Normocephalic and atraumatic.  Eyes: Conjunctivae are normal. Pupils are equal, round, and reactive to light. Left eye exhibits hordeolum.  Pulmonary/Chest: Effort normal.  Musculoskeletal: He exhibits no edema.  Neurological: He is alert and oriented to person, place, and time.  Skin: Skin is dry.  Psychiatric: He has a normal mood and affect. His behavior is normal. Thought content normal.   Is a large hordeolum on the left upper lid margin.   Assessment & Plan:   [name] was seen today for stye.  Diagnoses and all orders for this visit:  Hordeolum externum, left Orders: -     Ambulatory referral to Ophthalmology  Other orders -     trimethoprim-polymyxin b (POLYTRIM) ophthalmic solution; Place 2 drops into the left eye every 4 (four) hours.   I have discontinued Mr. Pippins's amoxicillin. I am also having him start on trimethoprim-polymyxin b. Additionally, I am having him maintain his lisinopril, amLODipine, aspirin, multivitamin with minerals, buPROPion, meloxicam, CIALIS, fluocinonide-emollient, and Lorcaserin HCl.  Meds ordered this encounter  Medications  . trimethoprim-polymyxin b (POLYTRIM) ophthalmic solution    Sig: Place 2 drops into the left eye every 4 (four) hours.    Dispense:  10 mL    Refill:  0    Appropriate red flag conditions were discussed with the patient as well as actions that should be taken.  Patient expressed his understanding.  Follow-up: Return if symptoms worsen or fail to improve.  Roselee Culver, MD

## 2016-05-30 ENCOUNTER — Ambulatory Visit (INDEPENDENT_AMBULATORY_CARE_PROVIDER_SITE_OTHER): Payer: Commercial Managed Care - HMO | Admitting: Physician Assistant

## 2016-05-30 ENCOUNTER — Encounter: Payer: Self-pay | Admitting: Physician Assistant

## 2016-05-30 VITALS — BP 104/76 | HR 80 | Temp 98.7°F | Ht 72.0 in | Wt 285.6 lb

## 2016-05-30 DIAGNOSIS — M79675 Pain in left toe(s): Secondary | ICD-10-CM

## 2016-05-30 LAB — GLUCOSE, POCT (MANUAL RESULT ENTRY): POC Glucose: 118 mg/dl — AB (ref 70–99)

## 2016-05-30 LAB — POCT GLYCOSYLATED HEMOGLOBIN (HGB A1C): Hemoglobin A1C: 5.4

## 2016-05-30 MED ORDER — CEPHALEXIN 500 MG PO CAPS: 500.0000 mg | ORAL_CAPSULE | Freq: Three times a day (TID) | ORAL | 0 refills | Status: DC

## 2016-05-30 NOTE — Progress Notes (Signed)
06/06/2016 8:34 AM   DOB: 15-Mar-1956 / MRN: [id number]  SUBJECTIVE:  Jesus [name] is a 61 y.o. male presenting for left sided toe pain that has been present for about 5 days.  Reports the toe is improving, and did have some pus coming from the lateral aspect.  Associates swelling.  This has improved dramatically since the onset. He denies a history of diabetes however he is not sure.  He has some pain with walking however this has also improved.    He is allergic to bee venom.   He  has a past medical history of Allergy; Hyperlipidemia; Hypertension; Pain, lower leg; Peripheral arterial disease ([place]); Snores; and Wears glasses.    He  reports that he has been smoking Cigarettes.  He has a 18.00 pack-year smoking history. He has never used smokeless tobacco. He reports that he drinks about 9.0 oz of alcohol per week . He reports that he does not use drugs. He  has no sexual activity history on file. The patient  has a past surgical history that includes ORIF foot fracture (2005); Hand Arthroplasty (1995); Colonoscopy; Microlaryngoscopy (Left, 01/17/2014); and Fracture surgery.  His family history includes Cancer in his mother; Diabetes in his father; Hypertension in his brother, daughter, and father; Mental illness in his daughter and sister; Stroke in his father.  Review of Systems  Constitutional: Negative for chills and fever.  Gastrointestinal: Negative for nausea.  Musculoskeletal: Positive for joint pain.  Neurological: Negative for dizziness.    The problem list and medications were reviewed and updated by myself where necessary and exist elsewhere in the encounter.   OBJECTIVE:  BP 104/76 (BP Location: Left Arm, Patient Position: Sitting, Cuff Size: Large)   Pulse 80   Temp 98.7 F (37.1 C) (Oral)   Ht 6' (1.829 m)   Wt 285 lb 9.6 oz (129.5 kg)   SpO2 98%   BMI 38.73 kg/m   Physical Exam  Constitutional: He is oriented to person, place, and time. He appears well-developed.  He does not appear ill.  Eyes: Conjunctivae and EOM are normal. Pupils are equal, round, and reactive to light.  Cardiovascular: Normal rate.   Pulmonary/Chest: Effort normal.  Abdominal: He exhibits no distension.  Musculoskeletal: Normal range of motion.       Feet:  Neurological: He is alert and oriented to person, place, and time. No cranial nerve deficit. Coordination normal.  Skin: Skin is warm and dry. He is not diaphoretic.  Psychiatric: He has a normal mood and affect.  Nursing note and vitals reviewed.   Lab Results  Component Value Date   HGBA1C 5.4 05/30/2016   Lab Results  Component Value Date   CREATININE 1.02 05/30/2016      No results found for this or any previous visit (from the past 72 hour(s)).  No results found.  ASSESSMENT AND PLAN:  Gram was seen today for foot swelling.  Diagnoses and all orders for this visit:  Toe pain, left: He is negative for diabetes. Will treat with abx and see him back in about 1 week, sooner if worse.  -     POCT glucose (manual entry) -     Hepatic Function Panel -     Creatinine, serum -     POCT glycosylated hemoglobin (Hb A1C) -     Uric Acid -     Care order/instruction: -     cephALEXin (KEFLEX) 500 MG capsule; Take 1 capsule (500 mg total) by mouth  3 (three) times daily.    The patient is advised to call or return to clinic if he does not see an improvement in symptoms, or to seek the care of the closest emergency department if he worsens with the above plan.   Philis Fendt, MHS, PA-C [facility] 06/06/2016 8:34 AM

## 2016-05-30 NOTE — Patient Instructions (Signed)
     IF you received an x-ray today, you will receive an invoice from [facility]. Please contact [facility] at [phone] with questions or concerns regarding your invoice.   IF you received labwork today, you will receive an invoice from [facility]. Please contact [facility] at [phone] with questions or concerns regarding your invoice.   Our billing staff will not be able to assist you with questions regarding bills from these companies.  You will be contacted with the lab results as soon as they are available. The fastest way to get your results is to activate your My Chart account. Instructions are located on the last page of this paperwork. If you have not heard from us regarding the results in 2 weeks, please contact this office.     

## 2016-05-31 LAB — URIC ACID: Uric Acid: 6.7 mg/dL (ref 3.7–8.6)

## 2016-05-31 LAB — CREATININE, SERUM
Creatinine, Ser: 1.02 mg/dL (ref 0.76–1.27)
GFR calc Af Amer: 92 mL/min/{1.73_m2} (ref >59)
GFR calc non Af Amer: 80 mL/min/{1.73_m2} (ref >59)

## 2016-05-31 LAB — HEPATIC FUNCTION PANEL
ALT: 14 IU/L (ref 0–44)
AST: 14 IU/L (ref 0–40)
Albumin: 4.5 g/dL (ref 3.6–4.8)
Alkaline Phosphatase: 58 IU/L (ref 39–117)
Bilirubin Total: 0.4 mg/dL (ref 0.0–1.2)
Bilirubin, Direct: 0.2 mg/dL (ref 0.00–0.40)
Total Protein: 6.9 g/dL (ref 6.0–8.5)

## 2016-05-31 NOTE — Progress Notes (Signed)
WORKUP LOOKS GREAT! Please send a letter. Philis Fendt, MS, PA-C 1:41 PM, 05/31/2016

## 2016-06-01 ENCOUNTER — Encounter: Payer: Self-pay | Admitting: *Deleted

## 2016-06-28 ENCOUNTER — Telehealth: Payer: Self-pay

## 2016-06-28 ENCOUNTER — Other Ambulatory Visit: Payer: Self-pay | Admitting: *Deleted

## 2016-06-28 DIAGNOSIS — B49 Unspecified mycosis: Secondary | ICD-10-CM

## 2016-06-28 MED ORDER — FLUCONAZOLE 150 MG PO TABS: 150.0000 mg | ORAL_TABLET | Freq: Once | ORAL | 0 refills | Status: DC

## 2016-06-28 NOTE — Telephone Encounter (Signed)
Pt seen Clark on 05/30/2016 and was told he was going prescribe him anti-fungal medication and he never received the prescription and the medication wasn't called in to [id number] [place] Please advise: [phone]

## 2016-08-05 ENCOUNTER — Other Ambulatory Visit: Payer: Self-pay | Admitting: Physician Assistant

## 2017-03-28 ENCOUNTER — Ambulatory Visit: Payer: 59 | Admitting: Podiatry

## 2017-03-28 ENCOUNTER — Encounter: Payer: Self-pay | Admitting: Podiatry

## 2017-03-28 VITALS — Ht 72.0 in | Wt 285.0 lb

## 2017-03-28 DIAGNOSIS — M79675 Pain in left toe(s): Secondary | ICD-10-CM | POA: Diagnosis not present

## 2017-03-28 DIAGNOSIS — M79674 Pain in right toe(s): Secondary | ICD-10-CM | POA: Diagnosis not present

## 2017-03-28 DIAGNOSIS — L600 Ingrowing nail: Secondary | ICD-10-CM

## 2017-03-28 DIAGNOSIS — Z79899 Other long term (current) drug therapy: Secondary | ICD-10-CM

## 2017-03-28 DIAGNOSIS — B351 Tinea unguium: Secondary | ICD-10-CM

## 2017-03-28 LAB — HEPATIC FUNCTION PANEL
AG Ratio: 1.7 (calc) (ref 1.0–2.5)
ALT: 13 U/L (ref 9–46)
AST: 15 U/L (ref 10–35)
Albumin: 4.4 g/dL (ref 3.6–5.1)
Alkaline phosphatase (APISO): 50 U/L (ref 40–115)
Bilirubin, Direct: 0.1 mg/dL (ref 0.0–0.2)
Globulin: 2.6 g/dL (calc) (ref 1.9–3.7)
Indirect Bilirubin: 0.3 mg/dL (ref 0.2–1.2)
Total Bilirubin: 0.4 mg/dL (ref 0.2–1.2)
Total Protein: 7 g/dL (ref 6.1–8.1)

## 2017-03-28 LAB — CBC WITH DIFFERENTIAL/PLATELET
Basophils Absolute: 72 cells/uL (ref 0–200)
Basophils Relative: 1.1 %
Eosinophils Absolute: 182 cells/uL (ref 15–500)
Eosinophils Relative: 2.8 %
HCT: 40.8 % (ref 38.5–50.0)
Hemoglobin: 13.9 g/dL (ref 13.2–17.1)
Lymphs Abs: 2847 cells/uL (ref 850–3900)
MCH: 30.6 pg (ref 27.0–33.0)
MCHC: 34.1 g/dL (ref 32.0–36.0)
MCV: 89.9 fL (ref 80.0–100.0)
MPV: 10.5 fL (ref 7.5–12.5)
Monocytes Relative: 8 %
Neutro Abs: 2880 {cells}/uL (ref 1500–7800)
Neutrophils Relative %: 44.3 %
Platelets: 301 10*3/uL (ref 140–400)
RBC: 4.54 10*6/uL (ref 4.20–5.80)
RDW: 12 % (ref 11.0–15.0)
Total Lymphocyte: 43.8 %
WBC mixed population: 520 cells/uL (ref 200–950)
WBC: 6.5 10*3/uL (ref 3.8–10.8)

## 2017-03-28 MED ORDER — TERBINAFINE HCL 250 MG PO TABS: 250.0000 mg | ORAL_TABLET | Freq: Every day | ORAL | 0 refills | Status: DC

## 2017-03-28 NOTE — Patient Instructions (Addendum)
Terbinafine oral granules What is this medicine? TERBINAFINE (TER bin a feen) is an antifungal medicine. It is used to treat certain kinds of fungal or yeast infections. This medicine may be used for other purposes; ask your health care provider or pharmacist if you have questions. COMMON BRAND NAME(S): Lamisil What should I tell my health care provider before I take this medicine? They need to know if you have any of these conditions: -drink alcoholic beverages -kidney disease -liver disease -an unusual or allergic reaction to Terbinafine, other medicines, foods, dyes, or preservatives -pregnant or trying to get pregnant -breast-feeding How should I use this medicine? Take this medicine by mouth. Follow the directions on the prescription label. Hold packet with cut line on top. Shake packet gently to settle contents. Tear packet open along cut line, or use scissors to cut across line. Carefully pour the entire contents of packet onto a spoonful of a soft food, such as pudding or other soft, non-acidic food such as mashed potatoes (do NOT use applesauce or a fruit-based food). If two packets are required for each dose, you may either sprinkle the content of both packets on one spoonful of non-acidic food, or sprinkle the contents of both packets on two spoonfuls of non-acidic food. Make sure that no granules remain in the packet. Swallow the mxiture of the food and granules without chewing. Take your medicine at regular intervals. Do not take it more often than directed. Take all of your medicine as directed even if you think you are better. Do not skip doses or stop your medicine early. Contact your pediatrician or health care professional regarding the use of this medicine in children. While this medicine may be prescribed for children as young as 4 years for selected conditions, precautions do apply. Overdosage: If you think you have taken too much of this medicine contact a poison control center  or emergency room at once. NOTE: This medicine is only for you. Do not share this medicine with others. What if I miss a dose? If you miss a dose, take it as soon as you can. If it is almost time for your next dose, take only that dose. Do not take double or extra doses. What may interact with this medicine? Do not take this medicine with any of the following medications: -thioridazine This medicine may also interact with the following medications: -beta-blockers -caffeine -cimetidine -cyclosporine -MAOIs like Carbex, Eldepryl, Marplan, Nardil, and Parnate -medicines for fungal infections like fluconazole and ketoconazole -medicines for irregular heartbeat like amiodarone, flecainide and propafenone -rifampin -SSRIs like citalopram, escitalopram, fluoxetine, fluvoxamine, paroxetine and sertraline -tricyclic antidepressants like amitriptyline, clomipramine, desipramine, imipramine, nortriptyline, and others -warfarin This list may not describe all possible interactions. Give your health care provider a list of all the medicines, herbs, non-prescription drugs, or dietary supplements you use. Also tell them if you smoke, drink alcohol, or use illegal drugs. Some items may interact with your medicine. What should I watch for while using this medicine? Your doctor may monitor your liver function. Tell your doctor right away if you have nausea or vomiting, loss of appetite, stomach pain on your right upper side, yellow skin, dark urine, light stools, or are over tired. You need to take this medicine for 6 weeks or longer to cure the fungal infection. Take your medicine regularly for as long as your doctor or health care professional tells you to. What side effects may I notice from receiving this medicine? Side effects that you should report  to your doctor or health care professional as soon as possible: -allergic reactions like skin rash or hives, swelling of the face, lips, or tongue -change in  vision -dark urine -fever or infection -general ill feeling or flu-like symptoms -light-colored stools -loss of appetite, nausea -redness, blistering, peeling or loosening of the skin, including inside the mouth -right upper belly pain -unusually weak or tired -yellowing of the eyes or skin Side effects that usually do not require medical attention (report to your doctor or health care professional if they continue or are bothersome): -changes in taste -diarrhea -hair loss -muscle or joint pain -stomach upset This list may not describe all possible side effects. Call your doctor for medical advice about side effects. You may report side effects to FDA at 1-800-FDA-1088. Where should I keep my medicine? Keep out of the reach of children. Store at room temperature between 15 and 30 degrees C (59 and 86 degrees F). Throw away any unused medicine after the expiration date. NOTE: This sheet is a summary. It may not cover all possible information. If you have questions about this medicine, talk to your doctor, pharmacist, or health care provider.  2018 Elsevier/Gold Standard (2007-07-06 17:25:48)

## 2017-03-28 NOTE — Progress Notes (Signed)
Subjective:    Patient ID: Jesus [name], male    DOB: 11/22/1955, 61 y.o.   MRN: [id number]  HPI  Chief Complaint  Patient presents with  . Ingrown Toenail    bilateral great toes   61 year old male presents the office today for concerns of thick, discolored toenails which is been ongoing for several years.  He also has ingrowing of both his big toenails which is again been ongoing for quite some time.  He previously was on 3 months of Lamisil last year his feet did improve but his nails are still thick and discolored.  He states that the both big toes and all the other nails get painful with pressure in shoes.  Denies any redness or drainage or swelling to the toenails.  Denies any open sores.  He has no other concerns today.   Review of Systems  All other systems reviewed and are negative.  Past Medical History:  Diagnosis Date  . Allergy   . Hyperlipidemia   . Hypertension   . Pain, lower leg    Bilateral  . Peripheral arterial disease ([place])   . Snores   . Wears glasses     Past Surgical History:  Procedure Laterality Date  . COLONOSCOPY    . FRACTURE SURGERY    . HAND ARTHROPLASTY  1995   crushed left hand  . MICROLARYNGOSCOPY Left 01/17/2014   Procedure: MICROLARYNGOSCOPY WITH EXCISION OF THE BIOPSY OF LEFT VOCAL CORD LESION;  Surgeon: Izora Gala, MD;  Location: [facility];  Service: ENT;  Laterality: Left;  . ORIF FOOT FRACTURE  2005   left     Current Outpatient Medications:  .  amLODipine (NORVASC) 5 MG tablet, Take 5 mg by mouth 2 (two) times daily. , Disp: , Rfl:  .  aspirin 81 MG tablet, Take 81 mg by mouth daily., Disp: , Rfl:  .  cephALEXin (KEFLEX) 500 MG capsule, Take 1 capsule (500 mg total) by mouth 3 (three) times daily., Disp: 30 capsule, Rfl: 0 .  CIALIS 20 MG tablet, , Disp: , Rfl: 0 .  fluconazole (DIFLUCAN) 150 MG tablet, Take 1 tablet (150 mg total) by mouth once a week. Take 1 tablet by mouth weekly, Disp: 3 tablet, Rfl: 0 .   fluocinonide-emollient (LIDEX-E) 0.05 % cream, Apply 1 application topically 2 (two) times daily., Disp: 30 g, Rfl: 0 .  lisinopril (PRINIVIL,ZESTRIL) 20 MG tablet, Take 20 mg by mouth daily., Disp: , Rfl:  .  Multiple Vitamins-Minerals (MULTIVITAMIN WITH MINERALS) tablet, Take 1 tablet by mouth daily., Disp: , Rfl:  .  terbinafine (LAMISIL) 250 MG tablet, Take 1 tablet (250 mg total) by mouth daily., Disp: 90 tablet, Rfl: 0  Allergies  Allergen Reactions  . Bee Venom Anaphylaxis    Social History   Socioeconomic History  . Marital status: Single    Spouse name: Not on file  . Number of children: Not on file  . Years of education: Not on file  . Highest education level: Not on file  Social Needs  . Financial resource strain: Not on file  . Food insecurity - worry: Not on file  . Food insecurity - inability: Not on file  . Transportation needs - medical: Not on file  . Transportation needs - non-medical: Not on file  Occupational History  . Not on file  Tobacco Use  . Smoking status: Current Every Day Smoker    Packs/day: 0.50    Years: 36.00  Pack years: 18.00    Types: Cigarettes  . Smokeless tobacco: Never Used  . Tobacco comment: pt states that he will quit 05/30/2013  Substance and Sexual Activity  . Alcohol use: Yes    Alcohol/week: 9.0 oz    Types: 10 Cans of beer, 5 Shots of liquor per week    Comment: occ  . Drug use: No  . Sexual activity: Not on file  Other Topics Concern  . Not on file  Social History Narrative  . Not on file         Objective:   Physical Exam  General: AAO x3, NAD  Dermatological: Nails are hypertrophic, dystrophic, brittle, discolored, elongated 10.  There is incurvation of both the medial and lateral aspects of bilateral hallux toenails.  There is tenderness the nails and there is no redness or drainage or any swelling.  No surrounding redness or drainage. Tenderness nails 1-5 bilaterally. No open lesions or pre-ulcerative  lesions are identified today.  Vascular: Dorsalis Pedis artery and Posterior Tibial artery pedal pulses are 2/4 bilateral with immedate capillary fill time.  There is no pain with calf compression, swelling, warmth, erythema.   Neruologic: Grossly intact via light touch bilateral. Protective threshold with Semmes Wienstein monofilament intact to all pedal sites bilateral.   Musculoskeletal: No gross boney pedal deformities bilateral. No pain, crepitus, or limitation noted with foot and ankle range of motion bilateral. Muscular strength 5/5 in all groups tested bilateral.  Gait: Unassisted, Nonantalgic.       Assessment & Plan:  61 year old male with bilateral hallux ingrown toenails, onychomycosis -Treatment options discussed including all alternatives, risks, and complications -Etiology of symptoms were discussed -Recommend partial nail avulsions but he wishes to hold off on today due to the upcoming holiday.  I sharply debrided the toenails x10 without any complications or bleeding.  Discussed treatment options.  He will also proceed with another round of Lamisil.  Discussed side effects.  LFT and CBC was ordered I also ordered 90 days of the medicine but not to start until I call him the results of blood work. -RTC 6 weeks or sooner if needed.  Trula Slade DPM

## 2017-04-04 ENCOUNTER — Telehealth: Payer: Self-pay | Admitting: *Deleted

## 2017-04-04 NOTE — Telephone Encounter (Signed)
I informed pt of Dr. Leigh Aurora review of results and orders.

## 2017-04-04 NOTE — Telephone Encounter (Signed)
-----   Message from Trula Slade, DPM sent at 03/29/2017 11:27 AM EST ----- Blood work normal- OK to start lamisil. Thanks.

## 2017-05-12 ENCOUNTER — Ambulatory Visit: Payer: 59 | Admitting: Podiatry

## 2017-05-24 ENCOUNTER — Telehealth: Payer: Self-pay | Admitting: Podiatry

## 2017-05-24 NOTE — Telephone Encounter (Signed)
[name]- can you please make sure that this was coded correctly? Thank you.

## 2017-05-24 NOTE — Telephone Encounter (Signed)
Pt called upset because he was charged 145.00 for removal of ingrown nail.Pt canceled appointment for 1.18.19.And said he was going to go somewhere else that cost less

## 2017-05-26 ENCOUNTER — Ambulatory Visit: Payer: 59 | Admitting: Podiatry

## 2017-08-24 DIAGNOSIS — I8311 Varicose veins of right lower extremity with inflammation: Secondary | ICD-10-CM | POA: Diagnosis not present

## 2017-08-24 DIAGNOSIS — I83811 Varicose veins of right lower extremities with pain: Secondary | ICD-10-CM | POA: Diagnosis not present

## 2017-09-08 DIAGNOSIS — I8311 Varicose veins of right lower extremity with inflammation: Secondary | ICD-10-CM | POA: Diagnosis not present

## 2017-09-08 DIAGNOSIS — I83811 Varicose veins of right lower extremities with pain: Secondary | ICD-10-CM | POA: Diagnosis not present

## 2017-09-29 DIAGNOSIS — H04123 Dry eye syndrome of bilateral lacrimal glands: Secondary | ICD-10-CM | POA: Diagnosis not present

## 2017-09-29 DIAGNOSIS — H26491 Other secondary cataract, right eye: Secondary | ICD-10-CM | POA: Diagnosis not present

## 2017-09-29 DIAGNOSIS — Z961 Presence of intraocular lens: Secondary | ICD-10-CM | POA: Diagnosis not present

## 2017-09-29 DIAGNOSIS — I83811 Varicose veins of right lower extremities with pain: Secondary | ICD-10-CM | POA: Diagnosis not present

## 2017-09-29 DIAGNOSIS — I8311 Varicose veins of right lower extremity with inflammation: Secondary | ICD-10-CM | POA: Diagnosis not present

## 2017-10-13 DIAGNOSIS — I83891 Varicose veins of right lower extremities with other complications: Secondary | ICD-10-CM | POA: Diagnosis not present

## 2017-10-13 DIAGNOSIS — I8311 Varicose veins of right lower extremity with inflammation: Secondary | ICD-10-CM | POA: Diagnosis not present

## 2017-11-23 DIAGNOSIS — E782 Mixed hyperlipidemia: Secondary | ICD-10-CM | POA: Diagnosis not present

## 2017-11-23 DIAGNOSIS — R7303 Prediabetes: Secondary | ICD-10-CM | POA: Diagnosis not present

## 2017-11-23 DIAGNOSIS — I10 Essential (primary) hypertension: Secondary | ICD-10-CM | POA: Diagnosis not present

## 2017-11-24 DIAGNOSIS — I8311 Varicose veins of right lower extremity with inflammation: Secondary | ICD-10-CM | POA: Diagnosis not present

## 2017-11-24 DIAGNOSIS — I83891 Varicose veins of right lower extremities with other complications: Secondary | ICD-10-CM | POA: Diagnosis not present

## 2017-12-16 ENCOUNTER — Ambulatory Visit: Payer: 59 | Admitting: Emergency Medicine

## 2017-12-22 ENCOUNTER — Emergency Department (HOSPITAL_COMMUNITY)
Admission: EM | Admit: 2017-12-22 | Discharge: 2017-12-23 | Disposition: A | Discharge status: Home / Self Care | Payer: 59 | Attending: Emergency Medicine | Admitting: Emergency Medicine

## 2017-12-22 ENCOUNTER — Encounter (HOSPITAL_COMMUNITY): Payer: Self-pay | Admitting: Emergency Medicine

## 2017-12-22 ENCOUNTER — Ambulatory Visit (INDEPENDENT_AMBULATORY_CARE_PROVIDER_SITE_OTHER): Payer: 59

## 2017-12-22 ENCOUNTER — Encounter: Payer: Self-pay | Admitting: Physician Assistant

## 2017-12-22 ENCOUNTER — Other Ambulatory Visit: Payer: Self-pay

## 2017-12-22 ENCOUNTER — Emergency Department (HOSPITAL_COMMUNITY): Payer: 59

## 2017-12-22 ENCOUNTER — Ambulatory Visit: Payer: 59 | Admitting: Physician Assistant

## 2017-12-22 VITALS — BP 133/73 | HR 96 | Temp 98.6°F | Resp 16 | Ht 72.0 in | Wt 299.0 lb

## 2017-12-22 DIAGNOSIS — Z96632 Presence of left artificial wrist joint: Secondary | ICD-10-CM | POA: Diagnosis not present

## 2017-12-22 DIAGNOSIS — R1909 Other intra-abdominal and pelvic swelling, mass and lump: Secondary | ICD-10-CM

## 2017-12-22 DIAGNOSIS — I10 Essential (primary) hypertension: Secondary | ICD-10-CM | POA: Diagnosis not present

## 2017-12-22 DIAGNOSIS — R599 Enlarged lymph nodes, unspecified: Secondary | ICD-10-CM | POA: Diagnosis not present

## 2017-12-22 DIAGNOSIS — F17210 Nicotine dependence, cigarettes, uncomplicated: Secondary | ICD-10-CM | POA: Diagnosis not present

## 2017-12-22 DIAGNOSIS — R2242 Localized swelling, mass and lump, left lower limb: Secondary | ICD-10-CM | POA: Diagnosis not present

## 2017-12-22 DIAGNOSIS — R600 Localized edema: Secondary | ICD-10-CM | POA: Diagnosis not present

## 2017-12-22 DIAGNOSIS — M7989 Other specified soft tissue disorders: Secondary | ICD-10-CM

## 2017-12-22 DIAGNOSIS — R591 Generalized enlarged lymph nodes: Secondary | ICD-10-CM

## 2017-12-22 DIAGNOSIS — R590 Localized enlarged lymph nodes: Secondary | ICD-10-CM | POA: Insufficient documentation

## 2017-12-22 DIAGNOSIS — K4090 Unilateral inguinal hernia, without obstruction or gangrene, not specified as recurrent: Secondary | ICD-10-CM | POA: Diagnosis not present

## 2017-12-22 DIAGNOSIS — K5730 Diverticulosis of large intestine without perforation or abscess without bleeding: Secondary | ICD-10-CM | POA: Diagnosis not present

## 2017-12-22 DIAGNOSIS — R1900 Intra-abdominal and pelvic swelling, mass and lump, unspecified site: Secondary | ICD-10-CM | POA: Diagnosis not present

## 2017-12-22 LAB — COMPREHENSIVE METABOLIC PANEL WITH GFR
ALT: 15 U/L (ref 0–44)
AST: 23 U/L (ref 15–41)
Alkaline Phosphatase: 51 U/L (ref 38–126)
BUN: 19 mg/dL (ref 8–23)
Creatinine, Ser: 1.04 mg/dL (ref 0.61–1.24)
GFR calc non Af Amer: >60 mL/min (ref >60)
Total Bilirubin: 0.6 mg/dL (ref 0.3–1.2)
Total Protein: 7 g/dL (ref 6.5–8.1)

## 2017-12-22 LAB — POCT URINALYSIS DIP (MANUAL ENTRY)
Bilirubin, UA: NEGATIVE
Blood, UA: NEGATIVE
Glucose, UA: NEGATIVE mg/dL
Ketones, POC UA: NEGATIVE mg/dL
Nitrite, UA: NEGATIVE
Protein Ur, POC: NEGATIVE mg/dL
Spec Grav, UA: 1.025 (ref 1.010–1.025)
Urobilinogen, UA: 1 E.U./dL
pH, UA: 6 (ref 5.0–8.0)

## 2017-12-22 LAB — POCT CBC
Granulocyte percent: 65.1 % (ref 37–80)
HCT, POC: 36.9 % — AB (ref 43.5–53.7)
Hemoglobin: 12.6 g/dL — AB (ref 14.1–18.1)
Lymph, poc: 2 (ref 0.6–3.4)
MCH, POC: 29.8 pg (ref 27–31.2)
MCHC: 34.2 g/dL (ref 31.8–35.4)
MCV: 87.3 fL (ref 80–97)
MID (cbc): 0.3 (ref 0–0.9)
MPV: 7.3 fL (ref 0–99.8)
POC Granulocyte: 4.3 (ref 2–6.9)
POC LYMPH PERCENT: 29.9 %L (ref 10–50)
POC MID %: 5 % (ref 0–12)
Platelet Count, POC: 366 10*3/uL (ref 142–424)
RBC: 4.23 M/uL — AB (ref 4.69–6.13)
RDW, POC: 14 %
WBC: 6.6 10*3/uL (ref 4.6–10.2)

## 2017-12-22 LAB — COMPREHENSIVE METABOLIC PANEL
Albumin: 4 g/dL (ref 3.5–5.0)
Anion gap: 9 (ref 5–15)
CO2: 27 mmol/L (ref 22–32)
Calcium: 9.6 mg/dL (ref 8.9–10.3)
Chloride: 108 mmol/L (ref 98–111)
GFR calc Af Amer: >60 mL/min (ref >60)
Glucose, Bld: 106 mg/dL — ABNORMAL HIGH (ref 70–99)
Potassium: 4 mmol/L (ref 3.5–5.1)
Sodium: 144 mmol/L (ref 135–145)

## 2017-12-22 LAB — CBC
HCT: 38.6 % — ABNORMAL LOW (ref 39.0–52.0)
Hemoglobin: 12.6 g/dL — ABNORMAL LOW (ref 13.0–17.0)
MCH: 29.8 pg (ref 26.0–34.0)
MCHC: 32.6 g/dL (ref 30.0–36.0)
MCV: 91.3 fL (ref 78.0–100.0)
Platelets: 366 10*3/uL (ref 150–400)
RBC: 4.23 MIL/uL (ref 4.22–5.81)
RDW: 13.9 % (ref 11.5–15.5)
WBC: 5.6 10*3/uL (ref 4.0–10.5)

## 2017-12-22 LAB — LACTATE DEHYDROGENASE: LDH: 239 U/L — ABNORMAL HIGH (ref 98–192)

## 2017-12-22 LAB — I-STAT CREATININE, ED: Creatinine, Ser: 1 mg/dL (ref 0.61–1.24)

## 2017-12-22 IMAGING — DX DG ABDOMEN 2V
4 series · 4 of 4 positions shown · non-contrast
Comparison: CT scan of [DATE].

CLINICAL DATA: Left groin swelling.

EXAM:
ABDOMEN - 2 VIEW

[abdomen erect]
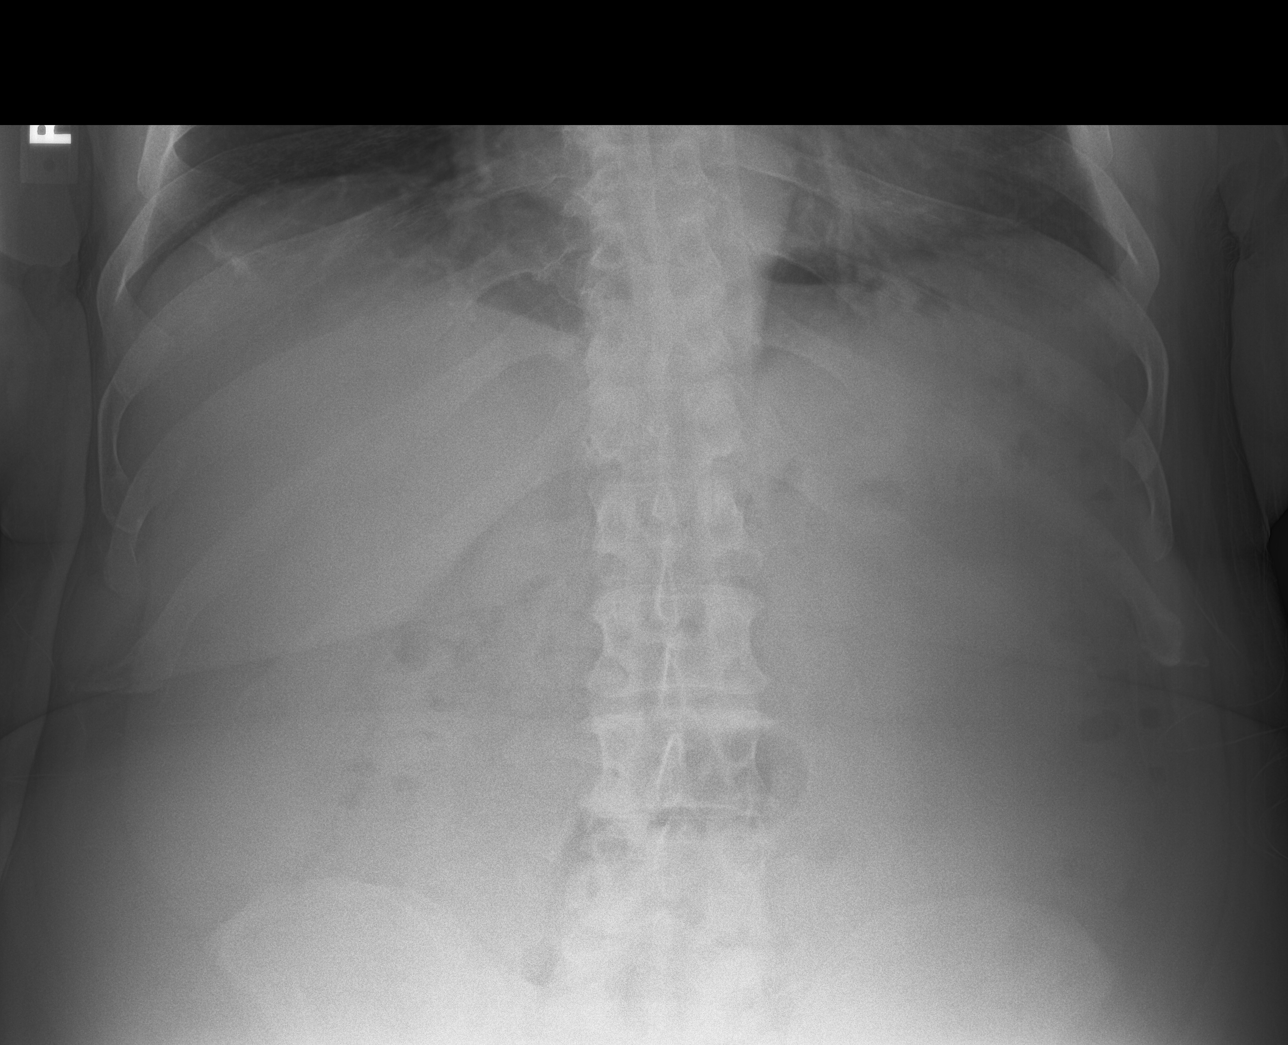

[abdomen supine (1 of 3)]
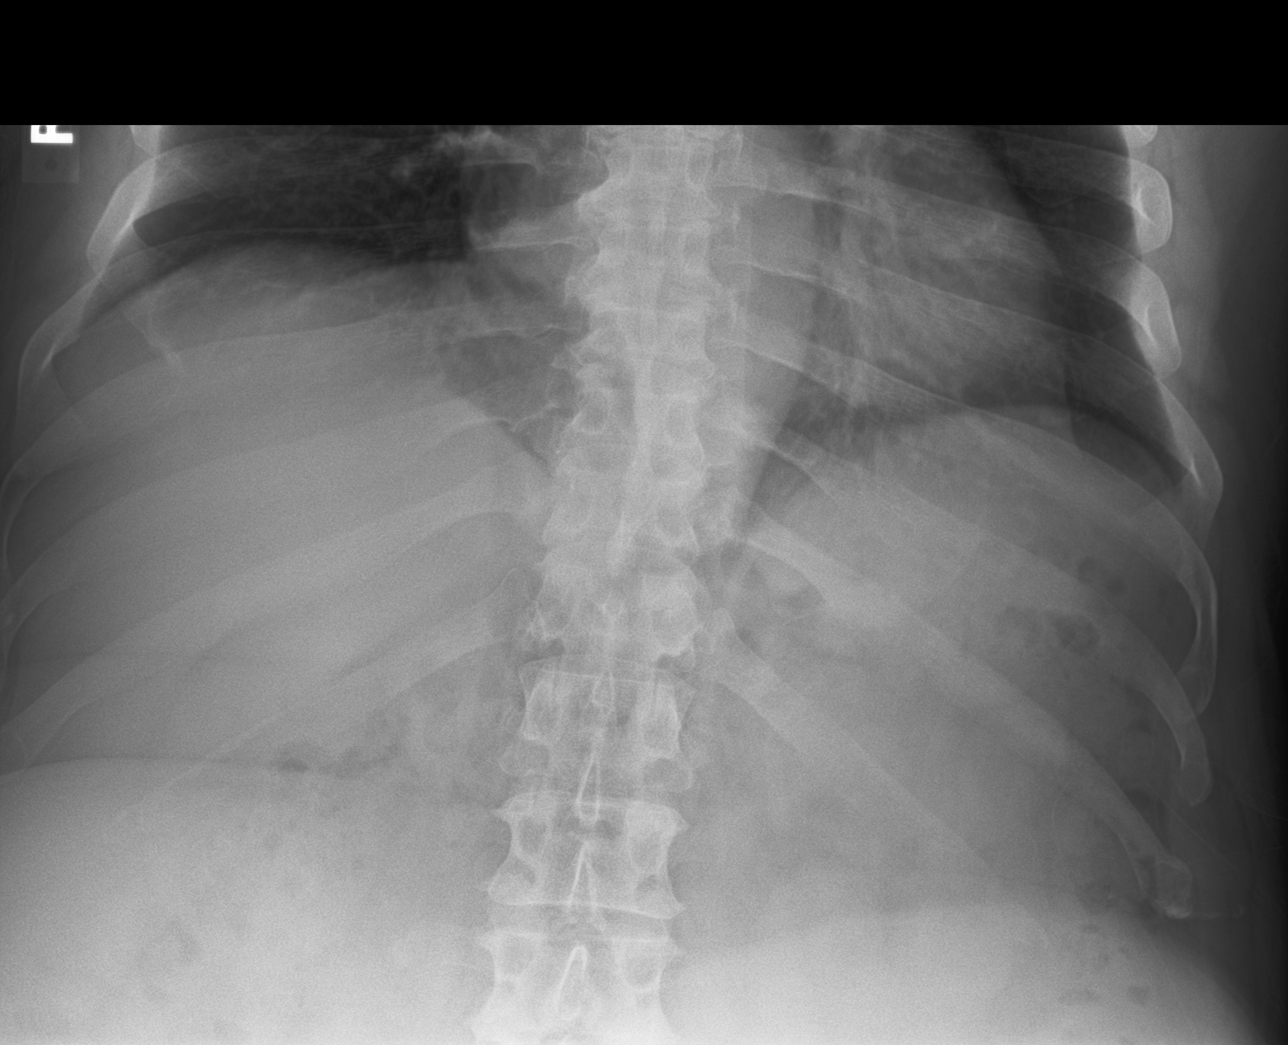

[abdomen supine (2 of 3)]
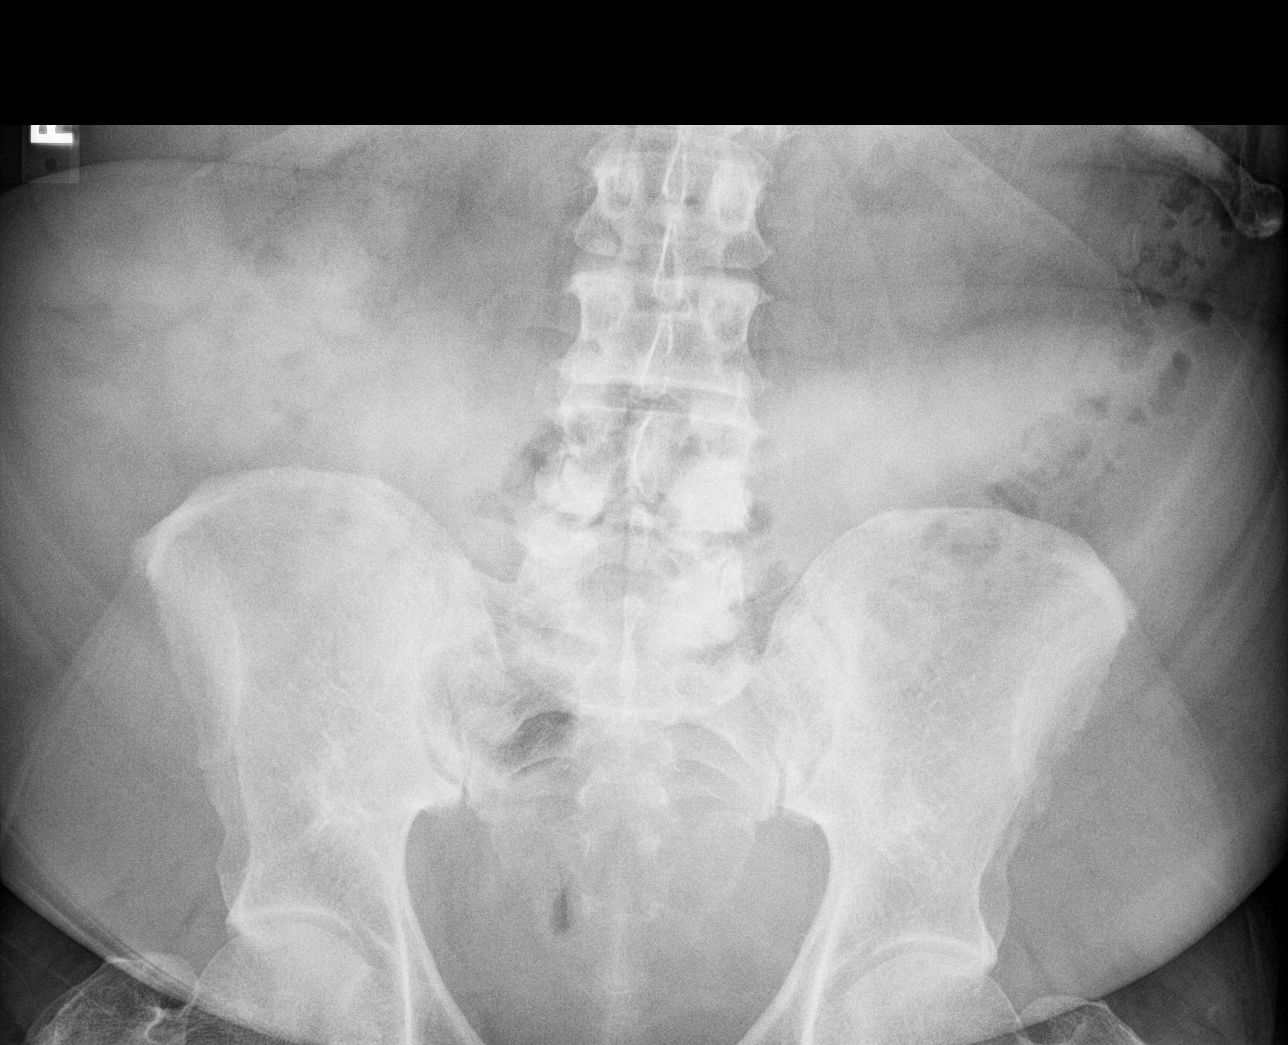

[abdomen supine (3 of 3)]
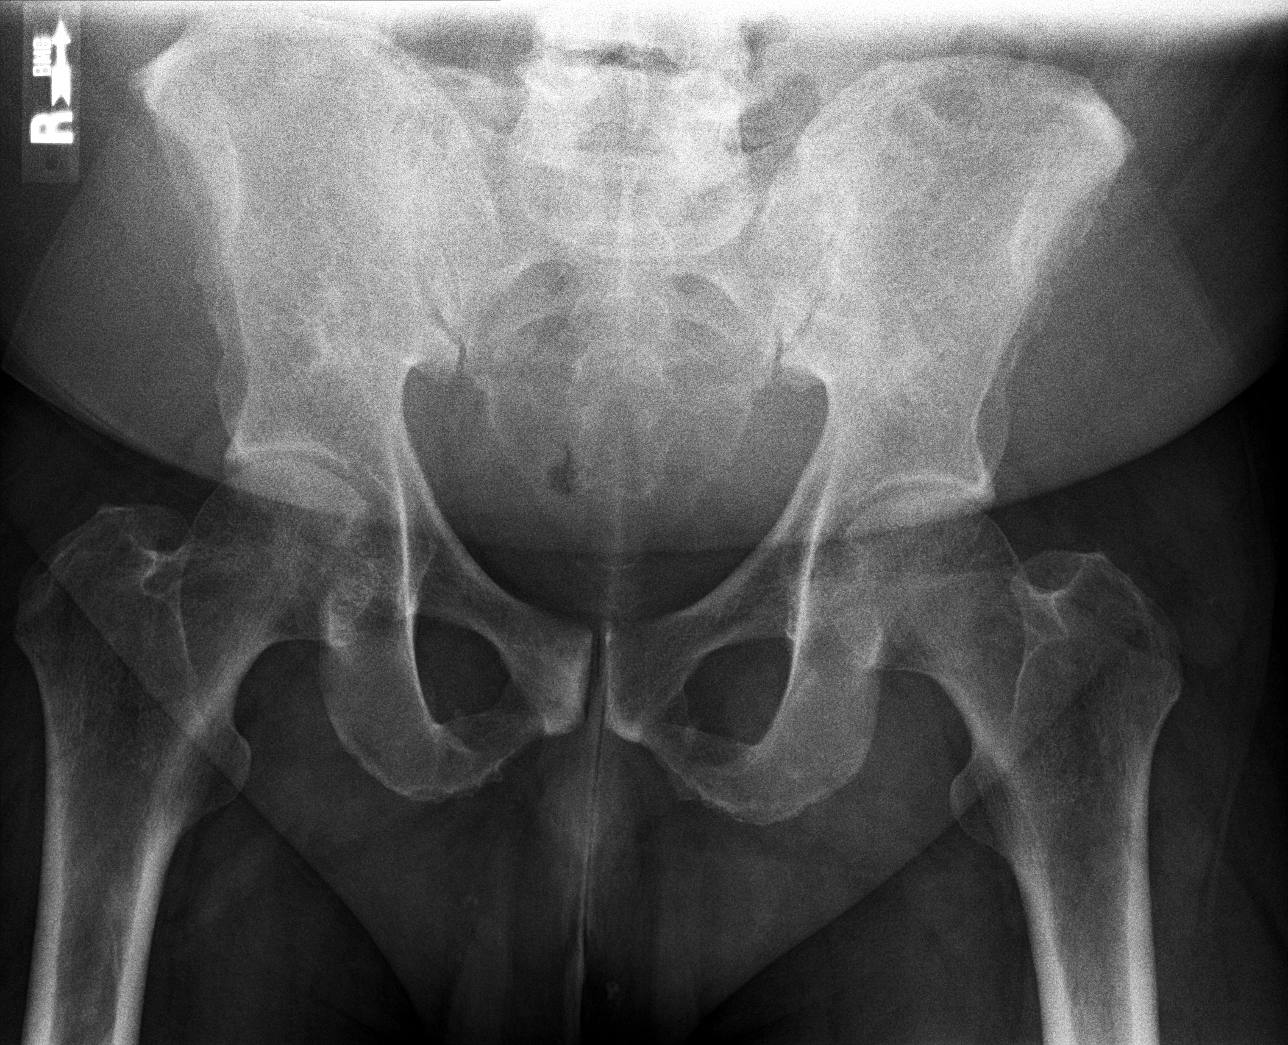

[4 of 4 positions shown; findings below may reference images not displayed]

FINDINGS: The bowel gas pattern is normal. There is no evidence of free air.
No radio-opaque calculi or other significant radiographic
abnormality is seen.
IMPRESSION: No evidence of bowel obstruction or ileus.

## 2017-12-22 IMAGING — CT CT ABD-PELV W/ CM
3 of 5 series · 16 of 46 positions shown, 18 images · IV contrast (iopamidol)
Comparison: CT abdomen pelvis [DATE].

CLINICAL DATA: Patient with lower abdominal and groin pain.

EXAM:
CT ABDOMEN AND PELVIS WITH CONTRAST
TECHNIQUE: Multidetector CT imaging of the abdomen and pelvis was performed
using the standard protocol following bolus administration of
intravenous contrast.
CONTRAST:  100mL [XR] IOPAMIDOL ([XR]) INJECTION 61%

[Series 2: axial st · axial · 0.98mm/px · z∈[+718,+1122]mm · 10 of 101 slices shown, 12 images]
[im 10/101  soft-tissue]
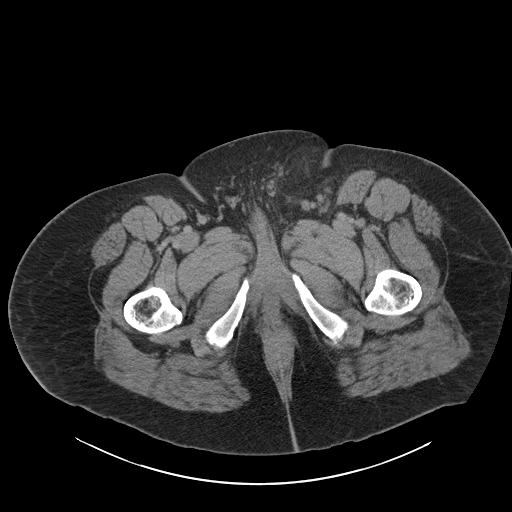
[im 10/101  bone]
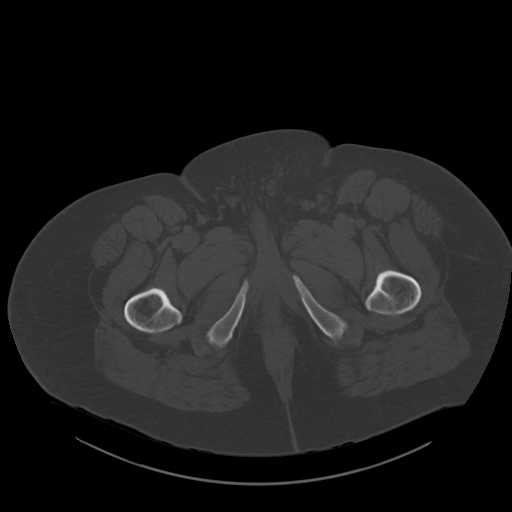
[im 19/101  soft-tissue]
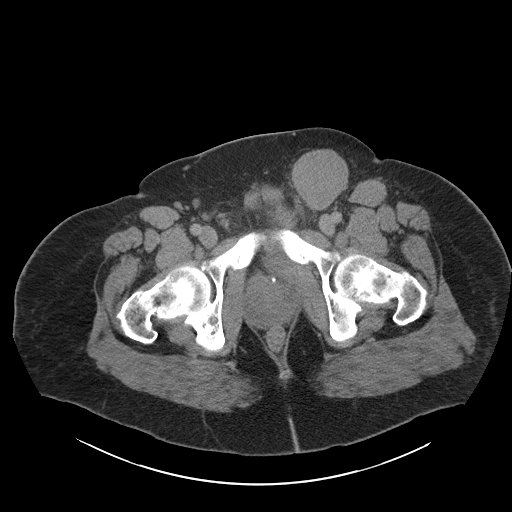
[im 28/101  soft-tissue]
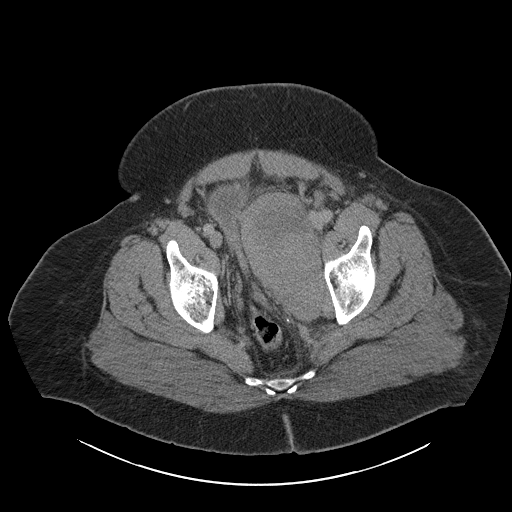
[im 37/101  soft-tissue]
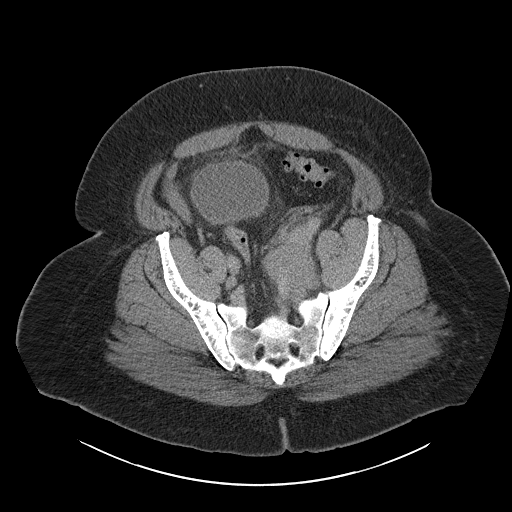
[im 46/101  soft-tissue]
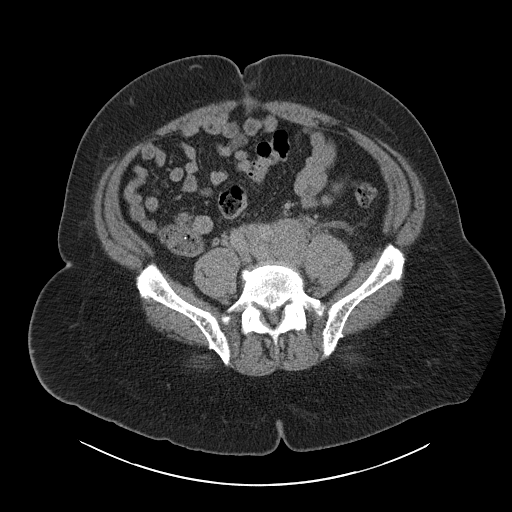
[im 55/101  soft-tissue]
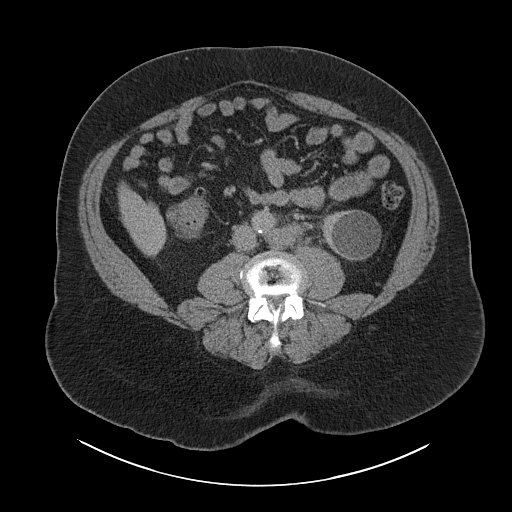
[im 64/101  soft-tissue]
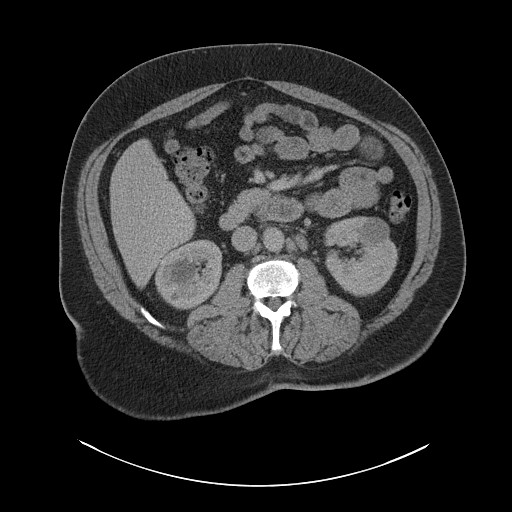
[im 73/101  soft-tissue]
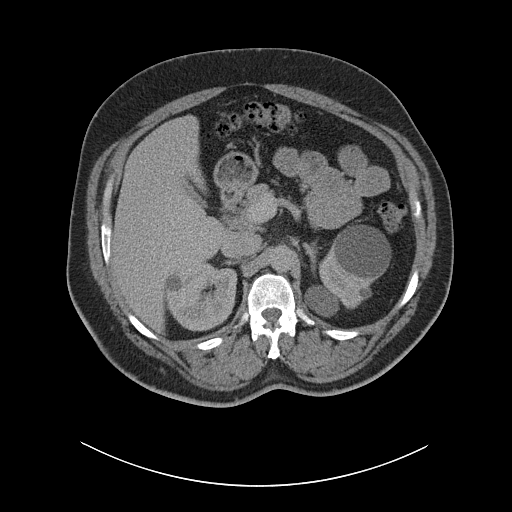
[im 82/101  soft-tissue]
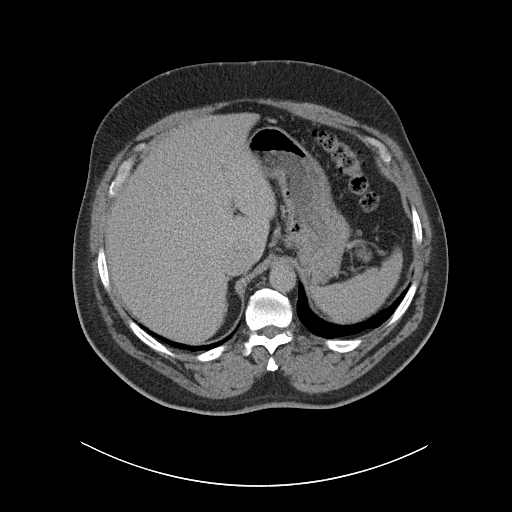
[im 82/101  bone]
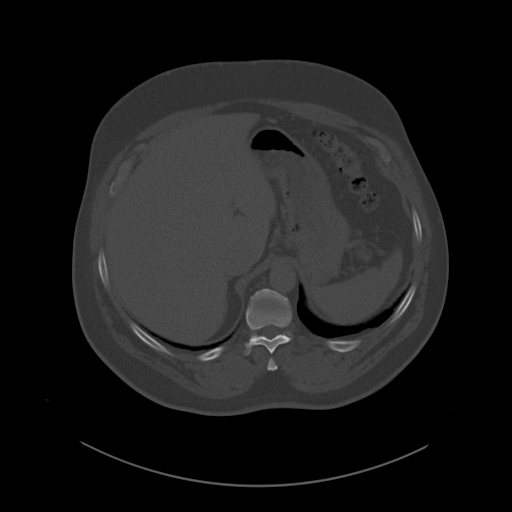
[im 91/101  soft-tissue]
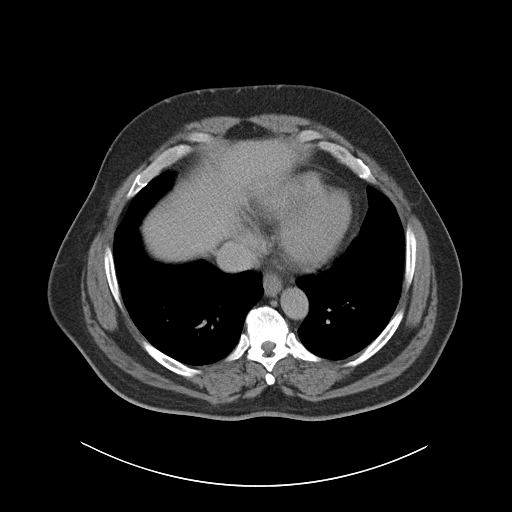

[Series 5: coronal st · coronal · 1.00mm/px · 3 of 149 slices shown]
[im 50/149  soft-tissue]
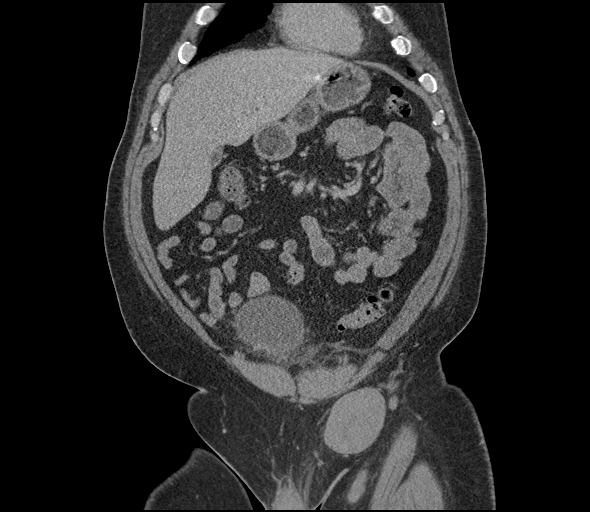
[im 66/149  soft-tissue]
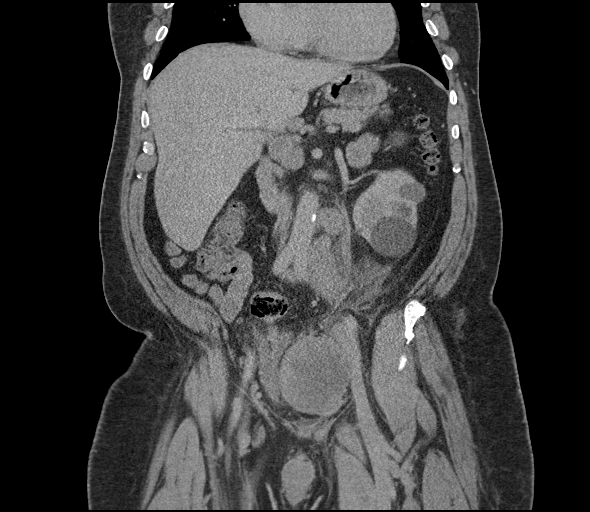
[im 83/149  soft-tissue]
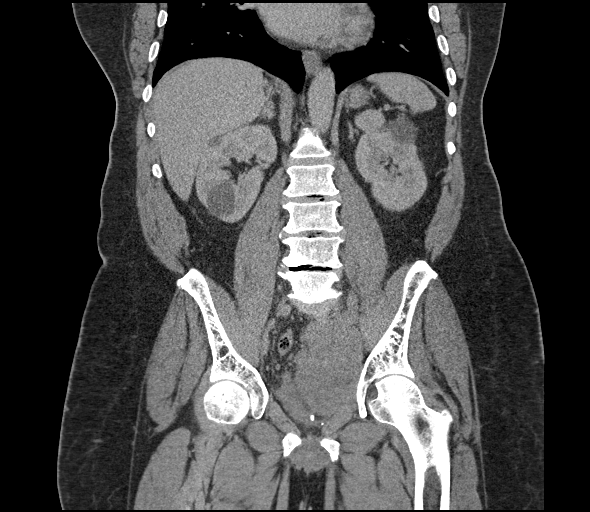

[Series 7: lung bases · axial · 0.98mm/px · z∈[+1042,+1078]mm · 3 of 75 slices shown]
[im 10/75  bone]
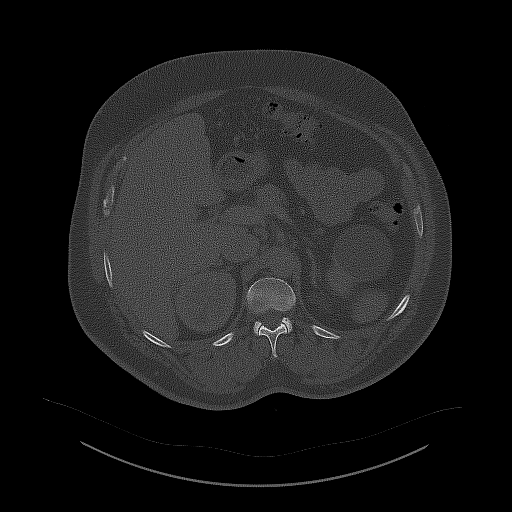
[im 19/75  bone]
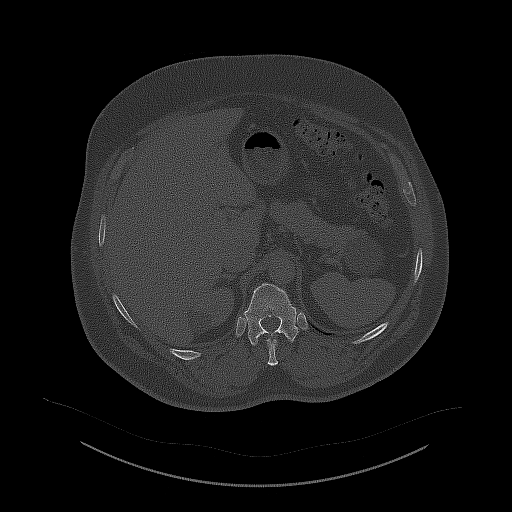
[im 28/75  bone]
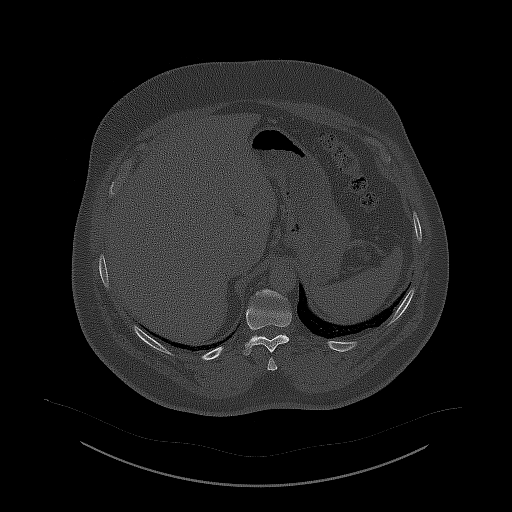

[16 of 46 positions shown; findings below may reference images not displayed]

FINDINGS: Lower chest: Normal heart size. Dependent atelectasis bilateral
lower lobes. No pleural effusion.

Hepatobiliary: Liver is normal in size and contour. No focal hepatic
lesions identified. Gallbladder is decompressed. No intrahepatic or
extrahepatic biliary ductal dilatation.

Pancreas: Unremarkable

Spleen: Unremarkable

Adrenals/Urinary Tract: Adrenal glands are normal. Multiple
bilateral renal cyst. No hydronephrosis. Urinary bladder is
decompressed.

Stomach/Bowel: Sigmoid colonic diverticulosis. No CT evidence for
acute diverticulitis. Normal appendix. No evidence for small bowel
obstruction. Small hiatal hernia. Normal morphology of the stomach.

Vascular/Lymphatic: Normal caliber abdominal aorta. Peripheral
calcified atherosclerotic plaque. Multiple enlarged retroperitoneal,
pelvic and left inguinal lymph nodes are demonstrated. Reference
cm left periaortic lymph node (image 55; series 2). Reference 13.4 x
6.8 cm left pelvic lymph node (image 74; series 2). Reference 5.2 x
6.1 cm left inguinal lymph node (image 86; series 2).

Reproductive: Heterogeneous prostate.

Other: Small fat containing right inguinal hernia.

Musculoskeletal: Lumbar spine degenerative changes. Heterogeneous
attenuation of the ileum bilaterally, unchanged from prior.
IMPRESSION: 1. Bulky left inguinal, left hemi pelvic and retroperitoneal
adenopathy. Considerations include metastatic disease or lymphoma.

## 2017-12-22 MED ORDER — IOPAMIDOL (ISOVUE-300) INJECTION 61%
INTRAVENOUS | Status: AC
  Filled 2017-12-22: qty 100

## 2017-12-22 MED ORDER — IOPAMIDOL (ISOVUE-300) INJECTION 61%
100.0000 mL | Freq: Once | INTRAVENOUS | Status: AC | PRN
  Administered 2017-12-22: 100 mL via INTRAVENOUS

## 2017-12-22 NOTE — Discharge Instructions (Addendum)
You were evaluated in the Emergency Department and after careful evaluation, we did not find any emergent condition requiring admission or further testing in the hospital.  Your symptoms today seem to be due to abnormally large lymph nodes within the groin and lower abdomen.  These findings are concerning for lymphoma, type of cancer.  We have arranged for you to follow-up as an outpatient with a cancer specialist, as well as a general surgeon to perform a biopsy to help [place] figure out the best way to treat you.  You should expect a call by these specialist within the next few days.  Dr. Barry Dienes will be your general surgeon and Dr. Burr Medico will be your hematologist/oncologist.  If you do not receive a phone call for an appointment by Tuesday, please call Dr. Burr Medico at [phone].  Please return to the Emergency Department if you experience any worsening of your condition.  We encourage you to follow up with a primary care provider.  Thank you for allowing [place] to be a part of your care.

## 2017-12-22 NOTE — ED Triage Notes (Addendum)
Patient is complaining left side abscess on groin. Patient states he went to the doctor and the the doctor does not know what it is. Patient is complaining of left leg itching and in swolllen.

## 2017-12-22 NOTE — Progress Notes (Deleted)
    12/22/2017 2:59 PM   DOB: 1955/06/03 / MRN: [id number]  SUBJECTIVE:  Jesus [name] is a 62 y.o. male presenting for ***. Symptoms present for ***.  The problem is ***. He has tried ***.  He is allergic to bee venom.   He  has a past medical history of Allergy, Hyperlipidemia, Hypertension, Pain, lower leg, Peripheral arterial disease ([place]), Snores, and Wears glasses.    He  reports that he has been smoking cigarettes. He has a 18.00 pack-year smoking history. He has never used smokeless tobacco. He reports that he drinks about 15.0 standard drinks of alcohol per week. He reports that he does not use drugs. He  has no sexual activity history on file. The patient  has a past surgical history that includes ORIF foot fracture (2005); Hand Arthroplasty (1995); Colonoscopy; Microlaryngoscopy (Left, 01/17/2014); and Fracture surgery.  His family history includes Cancer in his mother; Diabetes in his father; Hypertension in his brother, daughter, and father; Mental illness in his daughter and sister; Stroke in his father.  ROS  The problem list and medications were reviewed and updated by myself where necessary and exist elsewhere in the encounter.   OBJECTIVE:  BP 133/73   Pulse 96   Temp 98.6 F (37 C)   Resp 16   Ht 6' (1.829 m)   Wt 299 lb (135.6 kg)   SpO2 94%   BMI 40.55 kg/m   Wt Readings from Last 3 Encounters:  12/22/17 299 lb (135.6 kg)  03/28/17 285 lb (129.3 kg)  05/30/16 285 lb 9.6 oz (129.5 kg)   Temp Readings from Last 3 Encounters:  12/22/17 98.6 F (37 C)  05/30/16 98.7 F (37.1 C) (Oral)  12/14/14 98.2 F (36.8 C) (Oral)   BP Readings from Last 3 Encounters:  12/22/17 133/73  05/30/16 104/76  12/14/14 130/80   Pulse Readings from Last 3 Encounters:  12/22/17 96  05/30/16 80  12/14/14 82    Physical Exam  Lab Results  Component Value Date   HGBA1C 5.4 05/30/2016    Lab Results  Component Value Date   WBC 6.5 03/28/2017   HGB 13.9  03/28/2017   HCT 40.8 03/28/2017   MCV 89.9 03/28/2017   PLT 301 03/28/2017    Lab Results  Component Value Date   CREATININE 1.02 05/30/2016   BUN 20 04/29/2014   NA 141 04/29/2014   K 4.2 04/29/2014   CL 105 04/29/2014   CO2 27 04/29/2014    Lab Results  Component Value Date   ALT 13 03/28/2017   AST 15 03/28/2017   ALKPHOS 58 05/30/2016   BILITOT 0.4 03/28/2017    No results found for: TSH  No results found for: CHOL, HDL, LDLCALC, LDLDIRECT, TRIG, CHOLHDL   ASSESSMENT AND PLAN:  There are no diagnoses linked to this encounter.  The patient is advised to call or return to clinic if he does not see an improvement in symptoms, or to seek the care of the closest emergency department if he worsens with the above plan.   Philis Fendt, MHS, PA-C [facility] 12/22/2017 2:59 PM

## 2017-12-22 NOTE — Patient Instructions (Signed)
° ° ° °  If you have lab work done today you will be contacted with your lab results within the next 2 weeks.  If you have not heard from us then please contact us. The fastest way to get your results is to register for My Chart. ° ° °IF you received an x-ray today, you will receive an invoice from [facility]. Please contact [facility] at [phone] with questions or concerns regarding your invoice.  ° °IF you received labwork today, you will receive an invoice from [facility]. Please contact [facility] at [phone] with questions or concerns regarding your invoice.  ° °Our billing staff will not be able to assist you with questions regarding bills from these companies. ° °You will be contacted with the lab results as soon as they are available. The fastest way to get your results is to activate your My Chart account. Instructions are located on the last page of this paperwork. If you have not heard from us regarding the results in 2 weeks, please contact this office. °  ° ° ° °

## 2017-12-22 NOTE — ED Provider Notes (Signed)
[facility] Emergency Department Provider Note MRN:  [id number]  Arrival date & time: 12/22/17     Chief Complaint   Abscess and Possible Blood Clot   History of Present Illness   Jesus [name] is a 62 y.o. year-old male with a history of hypertension presenting to the ED with chief complaint of groin mass and leg swelling.  Reporting a mass in the left groin for several weeks.  Also endorsing asymmetric left leg swelling for several days.  No significant pain currently, states the mass in the groin is smaller than it was a few hours ago.  Normal bowel movements recently, no nausea no vomiting, no abdominal pain.  No significant leg pain.  Had an ultrasound on the leg today look for DVT, no evidence of DVT.  Sent for CT of the abdomen for further evaluation.  Review of Systems  A complete 10 system review of systems was obtained and all systems are negative except as noted in the HPI and PMH.   Patient's Health History    Past Medical History:  Diagnosis Date  . Allergy   . Hyperlipidemia   . Hypertension   . Pain, lower leg    Bilateral  . Peripheral arterial disease ([place])   . Snores   . Wears glasses     Past Surgical History:  Procedure Laterality Date  . COLONOSCOPY    . FRACTURE SURGERY    . HAND ARTHROPLASTY  1995   crushed left hand  . MICROLARYNGOSCOPY Left 01/17/2014   Procedure: MICROLARYNGOSCOPY WITH EXCISION OF THE BIOPSY OF LEFT VOCAL CORD LESION;  Surgeon: Izora Gala, MD;  Location: [facility];  Service: ENT;  Laterality: Left;  . ORIF FOOT FRACTURE  2005   left    Family History  Problem Relation Age of Onset  . Cancer Mother   . Diabetes Father   . Hypertension Father   . Stroke Father   . Mental illness Sister   . Hypertension Daughter   . Mental illness Daughter   . Hypertension Brother     Social History   Socioeconomic History  . Marital status: Single    Spouse name: Not on file  . Number of children:  Not on file  . Years of education: Not on file  . Highest education level: Not on file  Occupational History  . Not on file  Social Needs  . Financial resource strain: Not on file  . Food insecurity:    Worry: Not on file    Inability: Not on file  . Transportation needs:    Medical: Not on file    Non-medical: Not on file  Tobacco Use  . Smoking status: Current Every Day Smoker    Packs/day: 0.50    Years: 36.00    Pack years: 18.00    Types: Cigarettes  . Smokeless tobacco: Never Used  . Tobacco comment: pt states that he will quit 05/30/2013  Substance and Sexual Activity  . Alcohol use: Yes    Alcohol/week: 15.0 standard drinks    Types: 10 Cans of beer, 5 Shots of liquor per week    Comment: occ  . Drug use: No  . Sexual activity: Not on file  Lifestyle  . Physical activity:    Days per week: Not on file    Minutes per session: Not on file  . Stress: Not on file  Relationships  . Social connections:    Talks on phone: Not on  file    Gets together: Not on file    Attends religious service: Not on file    Active member of club or organization: Not on file    Attends meetings of clubs or organizations: Not on file    Relationship status: Not on file  . Intimate partner violence:    Fear of current or ex partner: Not on file    Emotionally abused: Not on file    Physically abused: Not on file    Forced sexual activity: Not on file  Other Topics Concern  . Not on file  Social History Narrative  . Not on file     Physical Exam  Vital Signs and Nursing Notes reviewed Vitals:   12/22/17 1935 12/22/17 2322  BP: (!) 162/80 129/78  Pulse: 89 74  Resp: 18 18  Temp: 98.3 F (36.8 C)   SpO2: 98% 99%    CONSTITUTIONAL: Well-appearing, NAD NEURO:  Alert and oriented x 3, no focal deficits EYES:  eyes equal and reactive ENT/NECK:  no LAD, no JVD CARDIO: Regular rate, well-perfused, normal S1 and S2 PULM:  CTAB no wheezing or rhonchi GI/GU:  normal bowel  sounds, non-distended, non-tender, small nodule on the left groin, nontender MSK/SPINE:  No gross deformities, 2+ pitting edema to left leg SKIN:  no rash, atraumatic PSYCH:  Appropriate speech and behavior  Diagnostic and Interventional Summary    EKG Interpretation  Date/Time:    Ventricular Rate:    PR Interval:    QRS Duration:   QT Interval:    QTC Calculation:   R Axis:     Text Interpretation:        Labs Reviewed  CBC - Abnormal; Notable for the following components:      Result Value   Hemoglobin 12.6 (*)    HCT 38.6 (*)    All other components within normal limits  COMPREHENSIVE METABOLIC PANEL - Abnormal; Notable for the following components:   Glucose, Bld 106 (*)    All other components within normal limits  LACTATE DEHYDROGENASE  I-STAT CREATININE, ED    CT ABDOMEN PELVIS W CONTRAST  Final Result      Medications  iopamidol (ISOVUE-300) 61 % injection (has no administration in time range)  iopamidol (ISOVUE-300) 61 % injection 100 mL (100 mLs Intravenous Contrast Given 12/22/17 2136)     Procedures Critical Care  ED Course and Medical Decision Making  I have reviewed the triage vital signs and the nursing notes.  Pertinent labs & imaging results that were available during my care of the patient were reviewed by me and considered in my medical decision making (see below for details). Clinical Course as of Dec 22 2337  Fri Dec 22, 2017  2057 DVT ultrasound earlier today was negative.  Considering small reducible hernia in the left groin, no symptoms to suggest incarceration, or bowel obstruction related to the hernia.  Also considering vascular or lymph obstruction given the ipsilateral leg swelling.  Will CT here.   [MB]    Clinical Course User Index [MB] Maudie Flakes, MD    CT reveals bulky lymphadenopathy concerning for lymphoma or metastatic disease.  Patient is otherwise stable with very little complaints, labs unremarkable.  Discussed case  with both hospitalist as well as oncologist on-call, biopsy is indicated but would not be able to be done until Monday.  No real reason to keep patient in the hospital over the weekend, patient wishes to go home. Dr. Burr Medico will  facilitate follow-up and biopsy, which is to be performed by Dr. Barry Dienes of general surgery.  After the discussed management above, the patient was determined to be safe for discharge.  The patient was in agreement with this plan and all questions regarding their care were answered.  ED return precautions were discussed and the patient will return to the ED with any significant worsening of condition.  Barth Kirks. Sedonia Small, [place] [email or web address]  Final Clinical Impressions(s) / ED Diagnoses     ICD-10-CM   1. Lymphadenopathy R59.1   2. Leg swelling M79.89   3. Groin mass R19.09     ED Discharge Orders    None         Maudie Flakes, MD 12/22/17 2339

## 2017-12-22 NOTE — Progress Notes (Signed)
12/22/2017 3:31 PM   DOB: 01-29-1956 / MRN: [id number]  SUBJECTIVE:  Jesus [name] is a 62 y.o. male presenting for groin mass with leg swelling. Symptoms present for about 2 weeks and worsening.  He saw Dr. Aleda Grana in vein today for a history of varicosities and was negative for DVT. Marland Kitchen He has tried nothing for this problem.  He has no history of hernia. Denies pain at the site.  He is urinating more often and is moving his bowels normally for him. NO hx of cancer.   I spoke with Dr. Aleda Grana in vein who did confirm the absence of a DVT.  He recommended a CT with abdomen and pelvis.   He is allergic to bee venom.   He  has a past medical history of Allergy, Hyperlipidemia, Hypertension, Pain, lower leg, Peripheral arterial disease ([place]), Snores, and Wears glasses.    He  reports that he has been smoking cigarettes. He has a 18.00 pack-year smoking history. He has never used smokeless tobacco. He reports that he drinks about 15.0 standard drinks of alcohol per week. He reports that he does not use drugs. He  has no sexual activity history on file. The patient  has a past surgical history that includes ORIF foot fracture (2005); Hand Arthroplasty (1995); Colonoscopy; Microlaryngoscopy (Left, 01/17/2014); and Fracture surgery.  His family history includes Cancer in his mother; Diabetes in his father; Hypertension in his brother, daughter, and father; Mental illness in his daughter and sister; Stroke in his father.  Review of Systems  Constitutional: Negative for chills, diaphoresis and fever.  Eyes: Negative.   Respiratory: Negative for cough, hemoptysis, sputum production, shortness of breath and wheezing.   Cardiovascular: Negative for chest pain, orthopnea and leg swelling.  Gastrointestinal: Negative for abdominal pain, blood in stool, constipation, diarrhea, heartburn, melena, nausea and vomiting.  Genitourinary: Positive for frequency. Negative for dysuria, flank pain, hematuria  and urgency.  Skin: Negative for rash.  Neurological: Negative for dizziness, sensory change, speech change, focal weakness and headaches.    The problem list and medications were reviewed and updated by myself where necessary and exist elsewhere in the encounter.   OBJECTIVE:  BP 133/73   Pulse 96   Temp 98.6 F (37 C)   Resp 16   Ht 6' (1.829 m)   Wt 299 lb (135.6 kg)   SpO2 94%   BMI 40.55 kg/m   Wt Readings from Last 3 Encounters:  12/22/17 299 lb (135.6 kg)  03/28/17 285 lb (129.3 kg)  05/30/16 285 lb 9.6 oz (129.5 kg)   Temp Readings from Last 3 Encounters:  12/22/17 98.6 F (37 C)  05/30/16 98.7 F (37.1 C) (Oral)  12/14/14 98.2 F (36.8 C) (Oral)   BP Readings from Last 3 Encounters:  12/22/17 133/73  05/30/16 104/76  12/14/14 130/80   Pulse Readings from Last 3 Encounters:  12/22/17 96  05/30/16 80  12/14/14 82    Physical Exam  Constitutional: He is oriented to person, place, and time. He appears well-developed and well-nourished. He does not appear ill. No distress.  Eyes: Pupils are equal, round, and reactive to light. Conjunctivae and EOM are normal.  Cardiovascular: Normal rate, regular rhythm, S1 normal, S2 normal, normal heart sounds, intact distal pulses and normal pulses. Exam reveals no gallop and no friction rub.  No murmur heard. Pulmonary/Chest: Effort normal. He has no rales.  Abdominal: He exhibits mass (right sided pelvic just medial to the inguinal canal.  Non pulsatile.  Negative for pain and not reducible. ). He exhibits no distension. There is no tenderness. There is no rebound and no guarding. No hernia.  Musculoskeletal: Normal range of motion. He exhibits no edema.  Neurological: He is alert and oriented to person, place, and time. No cranial nerve deficit. Coordination normal.  Skin: Skin is warm and dry. No rash noted. He is not diaphoretic. No erythema. No pallor.  Psychiatric: He has a normal mood and affect.  Nursing note and  vitals reviewed.   Results for orders placed or performed in visit on 12/22/17  POCT CBC  Result Value Ref Range   WBC 6.6 4.6 - 10.2 K/uL   Lymph, poc 2.0 0.6 - 3.4   POC LYMPH PERCENT 29.9 10 - 50 %L   MID (cbc) 0.3 0 - 0.9   POC MID % 5.0 0 - 12 %M   POC Granulocyte 4.3 2 - 6.9   Granulocyte percent 65.1 37 - 80 %G   RBC 4.23 (A) 4.69 - 6.13 M/uL   Hemoglobin 12.6 (A) 14.1 - 18.1 g/dL   HCT, POC 36.9 (A) 43.5 - 53.7 %   MCV 87.3 80 - 97 fL   MCH, POC 29.8 27 - 31.2 pg   MCHC 34.2 31.8 - 35.4 g/dL   RDW, POC 14.0 %   Platelet Count, POC 366 142 - 424 K/uL   MPV 7.3 0 - 99.8 fL       ASSESSMENT AND PLAN:  Jesus [name] was seen today for mass.  Diagnoses and all orders for this visit:  Groin swelling: I would have liked to stat CT this patientt however [facility] refused to draw his I stat creatinine and was told this was due to the current policy. They advised that they would be happy to see the patient in ED, which in my mind equates to a very expensive istat for a similar work up. Given the swelling in his leg and the undifferentiated mass  I have asked him to present to the ED for further evaluation and disposition. -     DG Abd 2 Views; Future -     POCT CBC -     GC/Chlamydia Probe Amp -     US Pelvis Limited; Future -     HIV antibody (with reflex)  Leg swelling: I spoke with Dr. Aleda Grana who ruled out a DVT today.  -     CT Abdomen Pelvis W Contrast; Future    The patient is advised to call or return to clinic if he does not see an improvement in symptoms, or to seek the care of the closest emergency department if he worsens with the above plan.   Philis Fendt, MHS, PA-C [facility] 12/22/2017 3:31 PM

## 2017-12-23 LAB — HIV ANTIBODY (ROUTINE TESTING W REFLEX): HIV Screen 4th Generation wRfx: NONREACTIVE

## 2017-12-24 LAB — GC/CHLAMYDIA PROBE AMP
Chlamydia trachomatis, NAA: NEGATIVE
Neisseria gonorrhoeae by PCR: NEGATIVE

## 2017-12-25 ENCOUNTER — Other Ambulatory Visit: Payer: Self-pay | Admitting: Radiology

## 2017-12-25 ENCOUNTER — Encounter: Payer: Self-pay | Admitting: Oncology

## 2017-12-25 ENCOUNTER — Inpatient Hospital Stay: Payer: 59

## 2017-12-25 ENCOUNTER — Other Ambulatory Visit: Payer: Self-pay

## 2017-12-25 ENCOUNTER — Telehealth: Payer: Self-pay | Admitting: Hematology

## 2017-12-25 ENCOUNTER — Inpatient Hospital Stay: Payer: 59 | Attending: Oncology | Admitting: Oncology

## 2017-12-25 VITALS — BP 141/76 | HR 99 | Temp 98.4°F | Resp 18 | Ht 72.0 in | Wt 298.0 lb

## 2017-12-25 DIAGNOSIS — M7989 Other specified soft tissue disorders: Secondary | ICD-10-CM

## 2017-12-25 DIAGNOSIS — K4090 Unilateral inguinal hernia, without obstruction or gangrene, not specified as recurrent: Secondary | ICD-10-CM | POA: Diagnosis not present

## 2017-12-25 DIAGNOSIS — I739 Peripheral vascular disease, unspecified: Secondary | ICD-10-CM

## 2017-12-25 DIAGNOSIS — Z7982 Long term (current) use of aspirin: Secondary | ICD-10-CM

## 2017-12-25 DIAGNOSIS — R1030 Lower abdominal pain, unspecified: Secondary | ICD-10-CM | POA: Diagnosis not present

## 2017-12-25 DIAGNOSIS — R350 Frequency of micturition: Secondary | ICD-10-CM | POA: Diagnosis not present

## 2017-12-25 DIAGNOSIS — R51 Headache: Secondary | ICD-10-CM | POA: Diagnosis not present

## 2017-12-25 DIAGNOSIS — R7303 Prediabetes: Secondary | ICD-10-CM | POA: Diagnosis not present

## 2017-12-25 DIAGNOSIS — F17210 Nicotine dependence, cigarettes, uncomplicated: Secondary | ICD-10-CM | POA: Diagnosis not present

## 2017-12-25 DIAGNOSIS — R590 Localized enlarged lymph nodes: Secondary | ICD-10-CM

## 2017-12-25 DIAGNOSIS — E785 Hyperlipidemia, unspecified: Secondary | ICD-10-CM | POA: Diagnosis not present

## 2017-12-25 DIAGNOSIS — R599 Enlarged lymph nodes, unspecified: Secondary | ICD-10-CM

## 2017-12-25 DIAGNOSIS — Z79899 Other long term (current) drug therapy: Secondary | ICD-10-CM

## 2017-12-25 DIAGNOSIS — I10 Essential (primary) hypertension: Secondary | ICD-10-CM

## 2017-12-25 DIAGNOSIS — F1410 Cocaine abuse, uncomplicated: Secondary | ICD-10-CM | POA: Diagnosis not present

## 2017-12-25 DIAGNOSIS — R600 Localized edema: Secondary | ICD-10-CM

## 2017-12-25 LAB — URINALYSIS, COMPLETE (UACMP) WITH MICROSCOPIC
Bacteria, UA: NONE SEEN
Bilirubin Urine: NEGATIVE
Glucose, UA: NEGATIVE mg/dL
Hgb urine dipstick: NEGATIVE
Ketones, ur: NEGATIVE mg/dL
Nitrite: NEGATIVE
Protein, ur: NEGATIVE mg/dL
Specific Gravity, Urine: 1.021 (ref 1.005–1.030)
pH: 5 (ref 5.0–8.0)

## 2017-12-25 NOTE — Progress Notes (Signed)
[place]   [phone] Fax:([phone]) [place] Note   Patient Care Team: Shirline Frees, MD as PCP - General (Family Medicine) 12/25/2017  CHIEF COMPLAINTS/PURPOSE OF CONSULTATION:  Left lower extremity edema and lymphadenopathy  HISTORY OF PRESENTING ILLNESS:   Jesus [name] 62 y.o. male is here because of left lower extremity edema and lymphadenopathy.  The patient was seen in the emergency room this past Friday for the same issue.  A CT of the abdomen and pelvis was performed showing bulky left inguinal, left hemipelvic, and retroperitoneal adenopathy.  He was referred to [place] from the emergency room for further evaluation.  Doppler ultrasound of the left lower extremity was performed and was negative for DVT. Patient reports that he has been having left lower extremity edema in his left groin and left leg for approximately 1 month.  He states that the swelling in the left groin started to get better but then worsened.  The left lower extremity edema has slowly worsened over time.  Patient denies having fevers and chills.  He reports that he does have night sweats at times.  He reported having headaches approximate 1 [place]  He thinks his headaches are related to not having his blood pressure medication.  His headaches have now resolved.  He denies visual changes.  The patient denies chest pain, shortness of breath and cough.  No nausea, vomiting, constipation, diarrhea.  Denies abdominal pain.  The patient denies recent weight loss and has actually gained weight recently.  Patient denies epistaxis, bleeding gums, hemoptysis, hematuria, but occasionally, and melena.  He reports increased urinary frequency over the past month but no dysuria.  The patient is here for evaluation and discussion of his recent CT and lab findings.  MEDICAL HISTORY:  Past Medical History:  Diagnosis Date  . Allergy   . Hyperlipidemia     . Hypertension   . Pain, lower leg    Bilateral  . Peripheral arterial disease ([place])   . Pre-diabetes   . Red-green color blindness   . Snores   . Wears glasses     SURGICAL HISTORY: Past Surgical History:  Procedure Laterality Date  . COLONOSCOPY    . FRACTURE SURGERY    . HAND ARTHROPLASTY  1995   crushed left hand  . MICROLARYNGOSCOPY Left 01/17/2014   Procedure: MICROLARYNGOSCOPY WITH EXCISION OF THE BIOPSY OF LEFT VOCAL CORD LESION;  Surgeon: Izora Gala, MD;  Location: [facility];  Service: ENT;  Laterality: Left;  . ORIF FOOT FRACTURE  2005   left    SOCIAL HISTORY: Social History   Socioeconomic History  . Marital status: Divorced    Spouse name: Not on file  . Number of children: 3  . Years of education: Not on file  . Highest education level: Not on file  Occupational History  . Not on file  Social Needs  . Financial resource strain: Not on file  . Food insecurity:    Worry: Not on file    Inability: Not on file  . Transportation needs:    Medical: Not on file    Non-medical: Not on file  Tobacco Use  . Smoking status: Current Every Day Smoker    Packs/day: 1.00    Years: 36.00    Pack years: 36.00    Types: Cigarettes  . Smokeless tobacco: Never Used  Substance and Sexual Activity  . Alcohol use: Yes  Alcohol/week: 15.0 standard drinks    Types: 10 Cans of beer, 5 Shots of liquor per week  . Drug use: Yes    Types: Cocaine    Comment: reports cocaine usage ~2X/ month  . Sexual activity: Not on file  Lifestyle  . Physical activity:    Days per week: Not on file    Minutes per session: Not on file  . Stress: Not on file  Relationships  . Social connections:    Talks on phone: Not on file    Gets together: Not on file    Attends religious service: Not on file    Active member of club or organization: Not on file    Attends meetings of clubs or organizations: Not on file    Relationship status: Not on file  . Intimate  partner violence:    Fear of current or ex partner: Not on file    Emotionally abused: Not on file    Physically abused: Not on file    Forced sexual activity: Not on file  Other Topics Concern  . Not on file  Social History Narrative  . Not on file    FAMILY HISTORY: Family History  Problem Relation Age of Onset  . Breast cancer Mother   . Diabetes Father   . Hypertension Father   . Stroke Father   . Mental illness Sister   . Hypertension Daughter   . Mental illness Daughter   . Hypertension Brother   . Colon cancer Brother   . Breast cancer Sister     ALLERGIES:  is allergic to bee venom.  MEDICATIONS:  Current Outpatient Medications  Medication Sig Dispense Refill  . amLODipine (NORVASC) 5 MG tablet Take 5 mg by mouth daily.     Marland Kitchen aspirin 81 MG tablet Take 81 mg by mouth daily.    Marland Kitchen CIALIS 20 MG tablet Take 20 mg by mouth daily as needed for erectile dysfunction.   0  . lisinopril (PRINIVIL,ZESTRIL) 20 MG tablet Take 20 mg by mouth daily.    . Multiple Vitamins-Minerals (MULTIVITAMIN WITH MINERALS) tablet Take 1 tablet by mouth daily.    . naproxen (NAPROSYN) 500 MG tablet Take 500 mg by mouth 2 (two) times daily as needed.     No current facility-administered medications for this visit.     REVIEW OF SYSTEMS:   Constitutional: Denies fevers, chills.  Positive night sweats. Eyes: Denies blurriness of vision, double vision or watery eyes Ears, nose, mouth, throat, and face: Denies mucositis or sore throat Respiratory: Denies cough, dyspnea or wheezes Cardiovascular: Denies palpitations, chest discomfort.  Positive left lower extremity edema. Gastrointestinal:  Denies nausea, heartburn or change in bowel habits Skin: Denies abnormal skin rashes Lymphatics: Denies easy bruising.  Positive swelling to the left groin. Neurological:Denies numbness, tingling or new weaknesses Behavioral/Psych: Mood is stable, no new changes  All other systems were reviewed with the  patient and are negative.  PHYSICAL EXAMINATION: ECOG PERFORMANCE STATUS: 1 - Symptomatic but completely ambulatory  Vitals:   12/25/17 1340  BP: (!) 141/76  Pulse: 99  Resp: 18  Temp: 98.4 F (36.9 C)  SpO2: 97%   Filed Weights   12/25/17 1340  Weight: 298 lb (135.2 kg)    GENERAL:alert, no distress and comfortable SKIN: skin color, texture, turgor are normal, no rashes or significant lesions EYES: normal, conjunctiva are pink and non-injected, sclera clear OROPHARYNX:no exudate, no erythema and lips, buccal mucosa, and tongue normal  NECK: supple, thyroid  normal size, non-tender, without nodularity LYMPH:  no palpable lymphadenopathy in the cervical or axillary nodes.  Left inguinal lymph node palpable measures approximately 6 cm. LUNGS: clear to auscultation and percussion with normal breathing effort HEART: regular rate & rhythm and no murmurs.  No edema to the right lower extremity.  Left lower extremity with 2+ edema. ABDOMEN:abdomen soft, non-tender and normal bowel sounds Musculoskeletal:no cyanosis of digits and no clubbing  PSYCH: alert & oriented x 3 with fluent speech NEURO: no focal motor/sensory deficits  LABORATORY DATA:  I have reviewed the data as listed CBC Latest Ref Rng & Units 12/22/2017 12/22/2017 03/28/2017  WBC 4.0 - 10.5 K/uL 5.6 6.6 6.5  Hemoglobin 13.0 - 17.0 g/dL 12.6(L) 12.6(A) 13.9  Hematocrit 39.0 - 52.0 % 38.6(L) 36.9(A) 40.8  Platelets 150 - 400 K/uL 366 - 301    CMP Latest Ref Rng & Units 12/22/2017 12/22/2017 03/28/2017  Glucose 70 - 99 mg/dL 106(H) - -  BUN 8 - 23 mg/dL 19 - -  Creatinine 0.61 - 1.24 mg/dL 1.04 1.00 -  Sodium 135 - 145 mmol/L 144 - -  Potassium 3.5 - 5.1 mmol/L 4.0 - -  Chloride 98 - 111 mmol/L 108 - -  CO2 22 - 32 mmol/L 27 - -  Calcium 8.9 - 10.3 mg/dL 9.6 - -  Total Protein 6.5 - 8.1 g/dL 7.0 - 7.0  Total Bilirubin 0.3 - 1.2 mg/dL 0.6 - 0.4  Alkaline Phos 38 - 126 U/L 51 - -  AST 15 - 41 U/L 23 - 15  ALT 0 - 44  U/L 15 - 13   LDH 239  RADIOGRAPHIC STUDIES: I have personally reviewed the radiological images as listed and agreed with the findings in the report. Ct Abdomen Pelvis W Contrast  Result Date: 12/22/2017 CLINICAL DATA:  Patient with lower abdominal and groin pain. EXAM: CT ABDOMEN AND PELVIS WITH CONTRAST TECHNIQUE: Multidetector CT imaging of the abdomen and pelvis was performed using the standard protocol following bolus administration of intravenous contrast. CONTRAST:  14m ISOVUE-300 IOPAMIDOL (ISOVUE-300) INJECTION 61% COMPARISON:  CT abdomen pelvis 02/12/2010. FINDINGS: Lower chest: Normal heart size. Dependent atelectasis bilateral lower lobes. No pleural effusion. Hepatobiliary: Liver is normal in size and contour. No focal hepatic lesions identified. Gallbladder is decompressed. No intrahepatic or extrahepatic biliary ductal dilatation. Pancreas: Unremarkable Spleen: Unremarkable Adrenals/Urinary Tract: Adrenal glands are normal. Multiple bilateral renal cyst. No hydronephrosis. Urinary bladder is decompressed. Stomach/Bowel: Sigmoid colonic diverticulosis. No CT evidence for acute diverticulitis. Normal appendix. No evidence for small bowel obstruction. Small hiatal hernia. Normal morphology of the stomach. Vascular/Lymphatic: Normal caliber abdominal aorta. Peripheral calcified atherosclerotic plaque. Multiple enlarged retroperitoneal, pelvic and left inguinal lymph nodes are demonstrated. Reference 4.2 cm left periaortic lymph node (image 55; series 2). Reference 13.4 x 6.8 cm left pelvic lymph node (image 74; series 2). Reference 5.2 x 6.1 cm left inguinal lymph node (image 86; series 2). Reproductive: Heterogeneous prostate. Other: Small fat containing right inguinal hernia. Musculoskeletal: Lumbar spine degenerative changes. Heterogeneous attenuation of the ileum bilaterally, unchanged from prior. IMPRESSION: 1. Bulky left inguinal, left hemi pelvic and retroperitoneal adenopathy.  Considerations include metastatic disease or lymphoma. Electronically Signed   By: DLovey NewcomerM.D.   On: 12/22/2017 22:00   Dg Abd 2 Views  Result Date: 12/22/2017 CLINICAL DATA:  Left groin swelling. EXAM: ABDOMEN - 2 VIEW COMPARISON:  CT scan of February 12, 2010. FINDINGS: The bowel gas pattern is normal. There is no evidence of free  air. No radio-opaque calculi or other significant radiographic abnormality is seen. IMPRESSION: No evidence of bowel obstruction or ileus. Electronically Signed   By: Marijo Conception, M.D.   On: 12/22/2017 15:16    ASSESSMENT & PLAN:   This is a pleasant 62 year old African-American male a 4-week history of left lower extremity edema  1) Newly diagnosed extensive left inguinal lymphadenopathy, left pelvic and retroperitoneal lymphadenopathy. Clinical and radiographic picture is suggestive of lymphoma unless proven otherwise. Metastatic malignancy with unknown primary a second less likely possibility.  CT of the abdomen and pelvis performed on 12/22/2017 showed bulky left inguinal, left hemipelvic, and  retroperitoneal adenopathy.   HIV non reactive on 12/22/2017 2) left lower extremity swelling .  Doppler ultrasound for DVT was negative in the left lower extremity.   Likely from venous compression +/- lymphatic obstruction from bulky left inguinal, left hemipelvic, and  retroperitoneal adenopathy.   PLAN -labs and CT scan results were discussed with the patient.   -Discussed with the patient that his clinical presentation, mildly elevated LDH and CT scan result findings are concerning for lymphoma.   -Recommend further work-up.  He is scheduled for a CT guided core biopsy tomorrow in interventional radiology.   The patient will be set up for a PET scan for initial staging of likely lymphoma and to r/o other primary tumor and to expedite evaluation and treatment in the setting of symptomatic LLE swelling from venous and lymphatic compression . - We will also  obtain a hepatitis panel to evaluate for hepatitis B and C.   -We will check a urinalysis today due to the patient's symptoms of urinary frequency.   -The patient will be set up for 2D echocardiogram to evaluate for left ventricular ejection fraction. -The patient was counseled on smoking cessation.  The patient was advised to cut back on his alcohol intake and to stop using cocaine.  The patient will be seen back for follow-up in approximately 1 week to review his biopsy and PET scan results.   Send back to lab today. PET scan within 3-4 days. Biopsy by IR as scheduled tomorrow.  Echocardiogram with 1 week. Lab and F/U visit with Dr. Irene Limbo in ~ 1 week. OK to overbook per Dr. Irene Limbo.   The patient was seen and examined with Dr. Irene Limbo.   Orders Placed This Encounter  Procedures  . [place] CORE BIOPSY (LYMPH NODES)    Standing Status:   Future    Standing Expiration Date:   02/25/2019    Order Specific Question:   Lab orders requested (DO NOT place separate lab orders, these will be automatically ordered during procedure specimen collection):    Answer:   Surgical Pathology    Order Specific Question:   Reason for Exam (SYMPTOM  OR DIAGNOSIS REQUIRED)    Answer:   tissue diagnosis    Order Specific Question:   Preferred imaging location?    Answer:   [facility] PET Image Initial (PI) Skull Base To Thigh    Standing Status:   Future    Standing Expiration Date:   12/25/2018    Order Specific Question:   ** REASON FOR EXAM (FREE TEXT)    Answer:   abdominal and pelvic lymphadenopathy suspicious for lymphoma. Initial staging.    Order Specific Question:   If indicated for the ordered procedure, I authorize the administration of a radiopharmaceutical per Radiology protocol    Answer:   Yes    Order  Specific Question:   Preferred imaging location?    Answer:   [facility]    Order Specific Question:   Radiology Contrast Protocol - do NOT remove file path    Answer:    [email or web address]  . Hepatitis C antibody    Standing Status:   Future    Standing Expiration Date:   12/25/2018  . Hepatitis panel, acute    Standing Status:   Future    Standing Expiration Date:   12/25/2018  . Urinalysis, Complete w Microscopic    Standing Status:   Future    Number of Occurrences:   1    Standing Expiration Date:   12/26/2018  . ECHOCARDIOGRAM COMPLETE    Standing Status:   Future    Standing Expiration Date:   03/26/2019    Order Specific Question:   Where should this test be performed    Answer:   [facility]    Order Specific Question:   Complete or Limited study?    Answer:   Complete    Order Specific Question:   With Image Enhancing Agent or without Image Enhancing Agent?    Answer:   With Image Enhancing Agent    Order Specific Question:   Reason for exam-Echo    Answer:   Chemo  V67.2 / Z09    All questions were answered. The patient knows to call the clinic with any problems, questions or concerns.   Mikey Bussing, NP 12/25/2017 4:06 PM  ADDENDUM  Patient was Personally and independently interviewed, examined and relevant elements of the history of present illness were reviewed in details and an assessment and plan was created. All elements of the patient's history of present illness , assessment and plan were discussed in details with Altamese Dilling NP. The above documentation reflects our combined findings assessment and plan.  . The total time spent in the appointment was 60 minutes and more than 50% was on counseling and direct patient cares and co-ordination of cares.  Sullivan Lone MD MS

## 2017-12-25 NOTE — Telephone Encounter (Signed)
Pt has been scheduled to see Dr. Chapman Fitch today at 1pm. Pt aware to arrive early to be checked in on time.

## 2017-12-26 ENCOUNTER — Ambulatory Visit (HOSPITAL_COMMUNITY)
Admission: RE | Admit: 2017-12-26 | Discharge: 2017-12-26 | Disposition: A | Discharge status: Home / Self Care | Payer: 59 | Source: Ambulatory Visit | Attending: Hematology | Admitting: Hematology

## 2017-12-26 ENCOUNTER — Other Ambulatory Visit: Payer: Self-pay

## 2017-12-26 ENCOUNTER — Encounter (HOSPITAL_COMMUNITY): Payer: Self-pay

## 2017-12-26 DIAGNOSIS — E785 Hyperlipidemia, unspecified: Secondary | ICD-10-CM | POA: Diagnosis not present

## 2017-12-26 DIAGNOSIS — Z7982 Long term (current) use of aspirin: Secondary | ICD-10-CM | POA: Diagnosis not present

## 2017-12-26 DIAGNOSIS — F1490 Cocaine use, unspecified, uncomplicated: Secondary | ICD-10-CM | POA: Diagnosis not present

## 2017-12-26 DIAGNOSIS — I739 Peripheral vascular disease, unspecified: Secondary | ICD-10-CM | POA: Insufficient documentation

## 2017-12-26 DIAGNOSIS — R590 Localized enlarged lymph nodes: Secondary | ICD-10-CM | POA: Diagnosis not present

## 2017-12-26 DIAGNOSIS — R599 Enlarged lymph nodes, unspecified: Secondary | ICD-10-CM | POA: Diagnosis present

## 2017-12-26 DIAGNOSIS — Z79899 Other long term (current) drug therapy: Secondary | ICD-10-CM | POA: Diagnosis not present

## 2017-12-26 DIAGNOSIS — R600 Localized edema: Secondary | ICD-10-CM | POA: Diagnosis not present

## 2017-12-26 DIAGNOSIS — F17210 Nicotine dependence, cigarettes, uncomplicated: Secondary | ICD-10-CM | POA: Insufficient documentation

## 2017-12-26 DIAGNOSIS — R7303 Prediabetes: Secondary | ICD-10-CM | POA: Diagnosis not present

## 2017-12-26 DIAGNOSIS — L041 Acute lymphadenitis of trunk: Secondary | ICD-10-CM | POA: Diagnosis not present

## 2017-12-26 DIAGNOSIS — I10 Essential (primary) hypertension: Secondary | ICD-10-CM | POA: Insufficient documentation

## 2017-12-26 LAB — CBC
HCT: 39.8 % (ref 39.0–52.0)
Hemoglobin: 13 g/dL (ref 13.0–17.0)
MCH: 29.8 pg (ref 26.0–34.0)
MCHC: 32.7 g/dL (ref 30.0–36.0)
MCV: 91.3 fL (ref 78.0–100.0)
Platelets: 313 10*3/uL (ref 150–400)
RBC: 4.36 MIL/uL (ref 4.22–5.81)
RDW: 13.3 % (ref 11.5–15.5)
WBC: 4.4 10*3/uL (ref 4.0–10.5)

## 2017-12-26 LAB — PROTIME-INR
INR: 1
Prothrombin Time: 13.1 s (ref 11.4–15.2)

## 2017-12-26 MED ORDER — FENTANYL CITRATE (PF) 100 MCG/2ML IJ SOLN
INTRAMUSCULAR | Status: AC | PRN
  Administered 2017-12-26: 50 ug via INTRAVENOUS

## 2017-12-26 MED ORDER — MIDAZOLAM HCL 2 MG/2ML IJ SOLN
INTRAMUSCULAR | Status: AC | PRN
  Administered 2017-12-26: 1 mg via INTRAVENOUS

## 2017-12-26 MED ORDER — LIDOCAINE HCL (PF) 1 % IJ SOLN
INTRAMUSCULAR | Status: AC
  Filled 2017-12-26: qty 30

## 2017-12-26 MED ORDER — MIDAZOLAM HCL 2 MG/2ML IJ SOLN
INTRAMUSCULAR | Status: AC
  Filled 2017-12-26: qty 2

## 2017-12-26 MED ORDER — FENTANYL CITRATE (PF) 100 MCG/2ML IJ SOLN
INTRAMUSCULAR | Status: AC
  Filled 2017-12-26: qty 2

## 2017-12-26 MED ORDER — SODIUM CHLORIDE 0.9 % IV SOLN: INTRAVENOUS | Status: DC

## 2017-12-26 NOTE — Sedation Documentation (Signed)
Bedrest to start at 1410.

## 2017-12-26 NOTE — Procedures (Signed)
Interventional Radiology Procedure Note  Procedure: US guided core biopsy of left inguinal lymph node   Complications: None  Estimated Blood Loss: < 10 mL  Findings: 6 cm enlarged left inguinal lymph node sampled x 6 with 16 G core biopsy device.  Samples sent in saline.  Venetia Night. Kathlene Cote, M.D Pager:  [phone]

## 2017-12-26 NOTE — Discharge Instructions (Signed)
Needle Biopsy, Care After These instructions give you information about caring for yourself after your procedure. Your doctor may also give you more specific instructions. Call your doctor if you have any problems or questions after your procedure. Follow these instructions at home:  Rest as told by your doctor.  Take medicines only as told by your doctor.  There are many different ways to close and cover the biopsy site, including stitches (sutures), skin glue, and adhesive strips. Follow instructions from your doctor about: ? How to take care of your biopsy site. ? When and how you should change your bandage (dressing). ? When you should remove your dressing. ? Removing whatever was used to close your biopsy site.  Check your biopsy site every day for signs of infection. Watch for: ? Redness, swelling, or pain. ? Fluid, blood, or pus. Contact a doctor if:  You have a fever.  You have redness, swelling, or pain at the biopsy site, and it lasts longer than a few days.  You have fluid, blood, or pus coming from the biopsy site.  You feel sick to your stomach (nauseous).  You throw up (vomit). Get help right away if:  You are short of breath.  You have trouble breathing.  Your chest hurts.  You feel dizzy or you pass out (faint).  You have bleeding that does not stop with pressure or a bandage.  You cough up blood.  Your belly (abdomen) hurts. This information is not intended to replace advice given to you by your health care provider. Make sure you discuss any questions you have with your health care provider. Document Released: 04/07/2008 Document Revised: 10/01/2015 Document Reviewed: 04/21/2014 Elsevier Interactive Patient Education  2018 [place]. Moderate Conscious Sedation, Adult, Care After These instructions provide you with information about caring for yourself after your procedure. Your health care provider may also give you more specific instructions.  Your treatment has been planned according to current medical practices, but problems sometimes occur. Call your health care provider if you have any problems or questions after your procedure. What can I expect after the procedure? After your procedure, it is common:  To feel sleepy for several hours.  To feel clumsy and have poor balance for several hours.  To have poor judgment for several hours.  To vomit if you eat too soon.  Follow these instructions at home: For at least 24 hours after the procedure:   Do not: ? Participate in activities where you could fall or become injured. ? Drive. ? Use heavy machinery. ? Drink alcohol. ? Take sleeping pills or medicines that cause drowsiness. ? Make important decisions or sign legal documents. ? Take care of children on your own.  Rest. Eating and drinking  Follow the diet recommended by your health care provider.  If you vomit: ? Drink water, juice, or soup when you can drink without vomiting. ? Make sure you have little or no nausea before eating solid foods. General instructions  Have a responsible adult stay with you until you are awake and alert.  Take over-the-counter and prescription medicines only as told by your health care provider.  If you smoke, do not smoke without supervision.  Keep all follow-up visits as told by your health care provider. This is important. Contact a health care provider if:  You keep feeling nauseous or you keep vomiting.  You feel light-headed.  You develop a rash.  You have a fever. Get help right away if:  You  have trouble breathing. This information is not intended to replace advice given to you by your health care provider. Make sure you discuss any questions you have with your health care provider. Document Released: 02/13/2013 Document Revised: 09/28/2015 Document Reviewed: 08/15/2015 Elsevier Interactive Patient Education  [employer].

## 2017-12-26 NOTE — H&P (Signed)
Chief Complaint: Patient was seen in consultation today for left inguinal lymph node biopsy at the request of Feng,Yan  Referring Physician(s): Feng,Yan  Supervising Physician: Aletta Edouard  Patient Status: [facility] - Out-pt  History of Present Illness: Jesus [name] is a 62 y.o. male   Pt has noted left leg edema and swelling for few months Night sweats +smoker; occasional cocaine Was seen in ED last week for same Work up included doppler: Neg for DVT  Abd CT:IMPRESSION: 1. Bulky left inguinal, left hemi pelvic and retroperitoneal adenopathy. Considerations include metastatic disease or lymphoma.  Referred to Dr Burr Medico Note reflects worrisome for lymphoma Now scheduled for biopsy of Left inguinal LN    Past Medical History:  Diagnosis Date  . Allergy   . Hyperlipidemia   . Hypertension   . Pain, lower leg    Bilateral  . Peripheral arterial disease ([place])   . Pre-diabetes   . Red-green color blindness   . Snores   . Wears glasses     Past Surgical History:  Procedure Laterality Date  . COLONOSCOPY    . FRACTURE SURGERY    . HAND ARTHROPLASTY  1995   crushed left hand  . MICROLARYNGOSCOPY Left 01/17/2014   Procedure: MICROLARYNGOSCOPY WITH EXCISION OF THE BIOPSY OF LEFT VOCAL CORD LESION;  Surgeon: Izora Gala, MD;  Location: [place];  Service: ENT;  Laterality: Left;  . ORIF FOOT FRACTURE  2005   left    Allergies: Bee venom  Medications: Prior to Admission medications   Medication Sig Start Date End Date Taking? Authorizing Provider  amLODipine (NORVASC) 5 MG tablet Take 5 mg by mouth daily.    Yes [provider]  aspirin 81 MG tablet Take 81 mg by mouth daily.   Yes [provider]  lisinopril (PRINIVIL,ZESTRIL) 20 MG tablet Take 20 mg by mouth daily.   Yes [provider]  Multiple Vitamins-Minerals (MULTIVITAMIN WITH MINERALS) tablet Take 1 tablet by mouth daily.   Yes [provider]    naproxen (NAPROSYN) 500 MG tablet Take 500 mg by mouth 2 (two) times daily as needed.   Yes [provider]  CIALIS 20 MG tablet Take 20 mg by mouth daily as needed for erectile dysfunction.  04/22/14   [provider]     Family History  Problem Relation Age of Onset  . Breast cancer Mother   . Diabetes Father   . Hypertension Father   . Stroke Father   . Mental illness Sister   . Hypertension Daughter   . Mental illness Daughter   . Hypertension Brother   . Colon cancer Brother   . Breast cancer Sister     Social History   Socioeconomic History  . Marital status: Divorced    Spouse name: Not on file  . Number of children: 3  . Years of education: Not on file  . Highest education level: Not on file  Occupational History  . Not on file  Social Needs  . Financial resource strain: Not on file  . Food insecurity:    Worry: Not on file    Inability: Not on file  . Transportation needs:    Medical: Not on file    Non-medical: Not on file  Tobacco Use  . Smoking status: Current Every Day Smoker    Packs/day: 1.00    Years: 36.00    Pack years: 36.00    Types: Cigarettes  . Smokeless tobacco: Never Used  Substance and Sexual Activity  . Alcohol use: Yes    Alcohol/week: 15.0 standard drinks    Types: 10 Cans of beer, 5 Shots of liquor per week  . Drug use: Yes    Types: Cocaine    Comment: reports cocaine usage ~2X/ month  . Sexual activity: Not on file  Lifestyle  . Physical activity:    Days per week: Not on file    Minutes per session: Not on file  . Stress: Not on file  Relationships  . Social connections:    Talks on phone: Not on file    Gets together: Not on file    Attends religious service: Not on file    Active member of club or organization: Not on file    Attends meetings of clubs or organizations: Not on file    Relationship status: Not on file  Other Topics Concern  . Not on file  Social History Narrative  . Not on file     Review of Systems: A 12 point ROS discussed and pertinent positives are indicated in the HPI above.  All other systems are negative.  Review of Systems  Constitutional: Positive for activity change. Negative for fatigue and fever.  Respiratory: Negative for cough and shortness of breath.   Cardiovascular: Negative for chest pain.  Gastrointestinal: Negative for abdominal pain.  Skin: Negative for color change.  Neurological: Negative for weakness.  Psychiatric/Behavioral: Negative for behavioral problems and confusion.    Vital Signs: BP (!) 150/88   Pulse 72   Temp 98.3 F (36.8 C) (Oral)   Ht 6' (1.829 m)   Wt 299 lb (135.6 kg)   SpO2 95%   BMI 40.55 kg/m   Physical Exam  Constitutional: He is oriented to person, place, and time.  Cardiovascular: Normal rate, regular rhythm and normal heart sounds.  Pulmonary/Chest: Effort normal and breath sounds normal.  Abdominal: Soft. Bowel sounds are normal.  Musculoskeletal: Normal range of motion.  Mild edema Left low extremity  Neurological: He is alert and oriented to person, place, and time.  Skin: Skin is warm and dry.  Psychiatric: He has a normal mood and affect. His behavior is normal. Judgment and thought content normal.  Nursing note and vitals reviewed.   Imaging: Ct Abdomen Pelvis W Contrast  Result Date: 12/22/2017 CLINICAL DATA:  Patient with lower abdominal and groin pain. EXAM: CT ABDOMEN AND PELVIS WITH CONTRAST TECHNIQUE: Multidetector CT imaging of the abdomen and pelvis was performed using the standard protocol following bolus administration of intravenous contrast. CONTRAST:  134m ISOVUE-300 IOPAMIDOL (ISOVUE-300) INJECTION 61% COMPARISON:  CT abdomen pelvis 02/12/2010. FINDINGS: Lower chest: Normal heart size. Dependent atelectasis bilateral lower lobes. No pleural effusion. Hepatobiliary: Liver is normal in size and contour. No focal hepatic lesions identified. Gallbladder is decompressed. No intrahepatic  or extrahepatic biliary ductal dilatation. Pancreas: Unremarkable Spleen: Unremarkable Adrenals/Urinary Tract: Adrenal glands are normal. Multiple bilateral renal cyst. No hydronephrosis. Urinary bladder is decompressed. Stomach/Bowel: Sigmoid colonic diverticulosis. No CT evidence for acute diverticulitis. Normal appendix. No evidence for small bowel obstruction. Small hiatal hernia. Normal morphology of the stomach. Vascular/Lymphatic: Normal caliber abdominal aorta. Peripheral calcified atherosclerotic plaque. Multiple enlarged retroperitoneal, pelvic and left inguinal lymph nodes are demonstrated. Reference 4.2 cm left periaortic lymph node (image 55; series 2). Reference 13.4 x 6.8 cm left pelvic lymph node (image 74; series 2). Reference 5.2 x 6.1 cm left inguinal lymph node (image 86; series 2). Reproductive: Heterogeneous prostate. Other: Small fat containing right  inguinal hernia. Musculoskeletal: Lumbar spine degenerative changes. Heterogeneous attenuation of the ileum bilaterally, unchanged from prior. IMPRESSION: 1. Bulky left inguinal, left hemi pelvic and retroperitoneal adenopathy. Considerations include metastatic disease or lymphoma. Electronically Signed   By: Lovey Newcomer M.D.   On: 12/22/2017 22:00   Dg Abd 2 Views  Result Date: 12/22/2017 CLINICAL DATA:  Left groin swelling. EXAM: ABDOMEN - 2 VIEW COMPARISON:  CT scan of February 12, 2010. FINDINGS: The bowel gas pattern is normal. There is no evidence of free air. No radio-opaque calculi or other significant radiographic abnormality is seen. IMPRESSION: No evidence of bowel obstruction or ileus. Electronically Signed   By: Marijo Conception, M.D.   On: 12/22/2017 15:16    Labs:  CBC: Recent Labs    03/28/17 1410 12/22/17 1518 12/22/17 2218 12/26/17 1102  WBC 6.5 6.6 5.6 4.4  HGB 13.9 12.6* 12.6* 13.0  HCT 40.8 36.9* 38.6* 39.8  PLT 301  --  366 313    COAGS: No results for input(s): INR, APTT in the last 8760  hours.  BMP: Recent Labs    12/22/17 2122 12/22/17 2218  NA  --  144  K  --  4.0  CL  --  108  CO2  --  27  GLUCOSE  --  106*  BUN  --  19  CALCIUM  --  9.6  CREATININE 1.00 1.04  GFRNONAA  --  >60  GFRAA  --  >60    LIVER FUNCTION TESTS: Recent Labs    03/28/17 1410 12/22/17 2218  BILITOT 0.4 0.6  AST 15 23  ALT 13 15  ALKPHOS  --  51  PROT 7.0 7.0  ALBUMIN  --  4.0    TUMOR MARKERS: No results for input(s): AFPTM, CEA, CA199, CHROMGRNA in the last 8760 hours.  Assessment and Plan:  Left low extremity swelling; edema DVT neg Imaging reveals bulky LAN Now scheduled for left inguinal LN biopsy Risks and benefits discussed with the patient including, but not limited to bleeding, infection, damage to adjacent structures or low yield requiring additional tests.  All of the patient's questions were answered, patient is agreeable to proceed. Consent signed and in chart.   Thank you for this interesting consult.  I greatly enjoyed meeting Jesus [name] and look forward to participating in their care.  A copy of this report was sent to the requesting provider on this date.  Electronically Signed: Lavonia Drafts, PA-C 12/26/2017, 11:38 AM   I spent a total of  30 Minutes   in face to face in clinical consultation, greater than 50% of which was counseling/coordinating care for left inguinal LN bx

## 2017-12-27 ENCOUNTER — Ambulatory Visit (HOSPITAL_COMMUNITY)
Admission: RE | Admit: 2017-12-27 | Discharge: 2017-12-27 | Disposition: A | Discharge status: Home / Self Care | Payer: 59 | Source: Ambulatory Visit | Attending: Oncology | Admitting: Oncology

## 2017-12-27 DIAGNOSIS — Z09 Encounter for follow-up examination after completed treatment for conditions other than malignant neoplasm: Secondary | ICD-10-CM | POA: Insufficient documentation

## 2017-12-27 DIAGNOSIS — I517 Cardiomegaly: Secondary | ICD-10-CM | POA: Insufficient documentation

## 2017-12-27 DIAGNOSIS — R599 Enlarged lymph nodes, unspecified: Secondary | ICD-10-CM | POA: Diagnosis not present

## 2017-12-28 ENCOUNTER — Other Ambulatory Visit: Payer: Self-pay | Admitting: Hematology

## 2017-12-28 DIAGNOSIS — R590 Localized enlarged lymph nodes: Secondary | ICD-10-CM

## 2017-12-28 NOTE — Progress Notes (Signed)
[place]   [phone] Fax:([phone] [place] Note   Date of Service:  01/01/18    Patient Care Team: Shirline Frees, MD as PCP - General (Family Medicine) 01/01/2018  CHIEF COMPLAINTS/PURPOSE OF CONSULTATION:  Likely Lymphoma  HISTORY OF PRESENTING ILLNESS:   Jesus [name] 62 y.o. male is here because of left lower extremity edema and lymphadenopathy.  The patient was seen in the emergency room this past Friday for the same issue.  A CT of the abdomen and pelvis was performed showing bulky left inguinal, left hemipelvic, and retroperitoneal adenopathy.  He was referred to [place] from the emergency room for further evaluation.  Doppler ultrasound of the left lower extremity was performed and was negative for DVT. Patient reports that he has been having left lower extremity edema in his left groin and left leg for approximately 1 month.  He states that the swelling in the left groin started to get better but then worsened.  The left lower extremity edema has slowly worsened over time.  Patient denies having fevers and chills.  He reports that he does have night sweats at times.  He reported having headaches approximate 1 [place]  He thinks his headaches are related to not having his blood pressure medication.  His headaches have now resolved.  He denies visual changes.  The patient denies chest pain, shortness of breath and cough.  No nausea, vomiting, constipation, diarrhea.  Denies abdominal pain.  The patient denies recent weight loss and has actually gained weight recently.  Patient denies epistaxis, bleeding gums, hemoptysis, hematuria, but occasionally, and melena.  He reports increased urinary frequency over the past month but no dysuria.  The patient is here for evaluation and discussion of his recent CT and lab findings.  Interval History:   Jesus [name] returns today for management and evaluation of his  lymphoproliferative process. I last saw the pt on 12/25/17 with Mikey Bussing, NP. [name].   The pt reports that in the brief interim his symptoms haven't worsened or alleviated. He notes that his leg swelling is stable an he is awaiting an appointment with Dr. Donne Hazel in surgery later today for consideration of an excisional biopsy.   Lab results (12/26/17) of CBC is as follows: all values are WNL.  On review of systems, pt reports stable left leg swelling, stable energy levels, and denies CP, SOB, abdominal pains, and any other symptsom.   MEDICAL HISTORY:  Past Medical History:  Diagnosis Date  . Allergy   . Hyperlipidemia   . Hypertension   . Pain, lower leg    Bilateral  . Peripheral arterial disease ([place])   . Pre-diabetes   . Red-green color blindness   . Snores   . Wears glasses     SURGICAL HISTORY: Past Surgical History:  Procedure Laterality Date  . COLONOSCOPY    . FRACTURE SURGERY    . HAND ARTHROPLASTY  1995   crushed left hand  . MICROLARYNGOSCOPY Left 01/17/2014   Procedure: MICROLARYNGOSCOPY WITH EXCISION OF THE BIOPSY OF LEFT VOCAL CORD LESION;  Surgeon: Izora Gala, MD;  Location: [facility];  Service: ENT;  Laterality: Left;  . ORIF FOOT FRACTURE  2005   left    SOCIAL HISTORY: Social History   Socioeconomic History  . Marital status: Divorced    Spouse name: Not on file  .  Number of children: 3  . Years of education: Not on file  . Highest education level: Not on file  Occupational History  . Not on file  Social Needs  . Financial resource strain: Not on file  . Food insecurity:    Worry: Not on file    Inability: Not on file  . Transportation needs:    Medical: Not on file    Non-medical: Not on file  Tobacco Use  . Smoking status: Current Every Day Smoker    Packs/day: 1.00    Years: 36.00    Pack years: 36.00    Types: Cigarettes  . Smokeless tobacco: Never Used  Substance and  Sexual Activity  . Alcohol use: Yes    Alcohol/week: 15.0 standard drinks    Types: 10 Cans of beer, 5 Shots of liquor per week  . Drug use: Yes    Types: Cocaine    Comment: reports cocaine usage ~2X/ month  . Sexual activity: Not on file  Lifestyle  . Physical activity:    Days per week: Not on file    Minutes per session: Not on file  . Stress: Not on file  Relationships  . Social connections:    Talks on phone: Not on file    Gets together: Not on file    Attends religious service: Not on file    Active member of club or organization: Not on file    Attends meetings of clubs or organizations: Not on file    Relationship status: Not on file  . Intimate partner violence:    Fear of current or ex partner: Not on file    Emotionally abused: Not on file    Physically abused: Not on file    Forced sexual activity: Not on file  Other Topics Concern  . Not on file  Social History Narrative  . Not on file    FAMILY HISTORY: Family History  Problem Relation Age of Onset  . Breast cancer Mother   . Diabetes Father   . Hypertension Father   . Stroke Father   . Mental illness Sister   . Hypertension Daughter   . Mental illness Daughter   . Hypertension Brother   . Colon cancer Brother   . Breast cancer Sister     ALLERGIES:  is allergic to bee venom.  MEDICATIONS:  Current Outpatient Medications  Medication Sig Dispense Refill  . amLODipine (NORVASC) 5 MG tablet Take 5 mg by mouth daily.     Marland Kitchen aspirin 81 MG tablet Take 81 mg by mouth daily.    Marland Kitchen CIALIS 20 MG tablet Take 20 mg by mouth daily as needed for erectile dysfunction.   0  . lisinopril (PRINIVIL,ZESTRIL) 20 MG tablet Take 20 mg by mouth daily.    . Multiple Vitamins-Minerals (MULTIVITAMIN WITH MINERALS) tablet Take 1 tablet by mouth daily.    . naproxen (NAPROSYN) 500 MG tablet Take 500 mg by mouth 2 (two) times daily as needed for moderate pain.      No current facility-administered medications for this  visit.     REVIEW OF SYSTEMS:   A 10+ POINT REVIEW OF SYSTEMS WAS OBTAINED including neurology, dermatology, psychiatry, cardiac, respiratory, lymph, extremities, GI, GU, Musculoskeletal, constitutional, breasts, reproductive, HEENT.  All pertinent positives are noted in the HPI.  All others are negative.   PHYSICAL EXAMINATION: ECOG PERFORMANCE STATUS: 1 - Symptomatic but completely ambulatory  Vitals:   01/01/18 0850  BP: (!) 145/88  Pulse:  67  Resp: 18  Temp: 97.8 F (36.6 C)  SpO2: 100%   Filed Weights   01/01/18 0850  Weight: (!) 300 lb 1.6 oz (136.1 kg)    GENERAL:alert, in no acute distress and comfortable SKIN: no acute rashes, no significant lesions EYES: conjunctiva are pink and non-injected, sclera anicteric OROPHARYNX: MMM, no exudates, no oropharyngeal erythema or ulceration NECK: supple, no JVD LYMPH:  Left inguinal lymph node palpable measures approximately 6 cm, no palpable lymphadenopathy in the cervical or  axillary regions LUNGS: clear to auscultation b/l with normal respiratory effort HEART: regular rate & rhythm ABDOMEN:  normoactive bowel sounds , non tender, not distended. No palpable hepatosplenomegaly.  Extremity: LLE 2+ pedal edema, no right pedal edema PSYCH: alert & oriented x 3 with fluent speech NEURO: no focal motor/sensory deficits   LABORATORY DATA:  I have reviewed the data as listed CBC Latest Ref Rng & Units 12/26/2017 12/22/2017 12/22/2017  WBC 4.0 - 10.5 K/uL 4.4 5.6 6.6  Hemoglobin 13.0 - 17.0 g/dL 13.0 12.6(L) 12.6(A)  Hematocrit 39.0 - 52.0 % 39.8 38.6(L) 36.9(A)  Platelets 150 - 400 K/uL 313 366 -    CMP Latest Ref Rng & Units 12/22/2017 12/22/2017 03/28/2017  Glucose 70 - 99 mg/dL 106(H) - -  BUN 8 - 23 mg/dL 19 - -  Creatinine 0.61 - 1.24 mg/dL 1.04 1.00 -  Sodium 135 - 145 mmol/L 144 - -  Potassium 3.5 - 5.1 mmol/L 4.0 - -  Chloride 98 - 111 mmol/L 108 - -  CO2 22 - 32 mmol/L 27 - -  Calcium 8.9 - 10.3 mg/dL 9.6 - -  Total  Protein 6.5 - 8.1 g/dL 7.0 - 7.0  Total Bilirubin 0.3 - 1.2 mg/dL 0.6 - 0.4  Alkaline Phos 38 - 126 U/L 51 - -  AST 15 - 41 U/L 23 - 15  ALT 0 - 44 U/L 15 - 13    12/26/17 Tissue Flow Cytometry:   12/26/17 Inguinal Core biopsy:    RADIOGRAPHIC STUDIES: I have personally reviewed the radiological images as listed and agreed with the findings in the report. Ct Abdomen Pelvis W Contrast  Result Date: 12/22/2017 CLINICAL DATA:  Patient with lower abdominal and groin pain. EXAM: CT ABDOMEN AND PELVIS WITH CONTRAST TECHNIQUE: Multidetector CT imaging of the abdomen and pelvis was performed using the standard protocol following bolus administration of intravenous contrast. CONTRAST:  183m ISOVUE-300 IOPAMIDOL (ISOVUE-300) INJECTION 61% COMPARISON:  CT abdomen pelvis 02/12/2010. FINDINGS: Lower chest: Normal heart size. Dependent atelectasis bilateral lower lobes. No pleural effusion. Hepatobiliary: Liver is normal in size and contour. No focal hepatic lesions identified. Gallbladder is decompressed. No intrahepatic or extrahepatic biliary ductal dilatation. Pancreas: Unremarkable Spleen: Unremarkable Adrenals/Urinary Tract: Adrenal glands are normal. Multiple bilateral renal cyst. No hydronephrosis. Urinary bladder is decompressed. Stomach/Bowel: Sigmoid colonic diverticulosis. No CT evidence for acute diverticulitis. Normal appendix. No evidence for small bowel obstruction. Small hiatal hernia. Normal morphology of the stomach. Vascular/Lymphatic: Normal caliber abdominal aorta. Peripheral calcified atherosclerotic plaque. Multiple enlarged retroperitoneal, pelvic and left inguinal lymph nodes are demonstrated. Reference 4.2 cm left periaortic lymph node (image 55; series 2). Reference 13.4 x 6.8 cm left pelvic lymph node (image 74; series 2). Reference 5.2 x 6.1 cm left inguinal lymph node (image 86; series 2). Reproductive: Heterogeneous prostate. Other: Small fat containing right inguinal hernia.  Musculoskeletal: Lumbar spine degenerative changes. Heterogeneous attenuation of the ileum bilaterally, unchanged from prior. IMPRESSION: 1. Bulky left inguinal, left hemi pelvic and  retroperitoneal adenopathy. Considerations include metastatic disease or lymphoma. Electronically Signed   By: Lovey Newcomer M.D.   On: 12/22/2017 22:00   Dg Abd 2 Views  Result Date: 12/22/2017 CLINICAL DATA:  Left groin swelling. EXAM: ABDOMEN - 2 VIEW COMPARISON:  CT scan of February 12, 2010. FINDINGS: The bowel gas pattern is normal. There is no evidence of free air. No radio-opaque calculi or other significant radiographic abnormality is seen. IMPRESSION: No evidence of bowel obstruction or ileus. Electronically Signed   By: Marijo Conception, M.D.   On: 12/22/2017 15:16   [place] Core Biopsy (lymph Nodes)  Result Date: 12/26/2017 CLINICAL DATA:  Enlarged left inguinal lymph node. EXAM: ULTRASOUND GUIDED CORE BIOPSY OF LEFT INGUINAL LYMPH NODE MEDICATIONS: 1.0 mg IV Versed; 50 mcg IV Fentanyl Total Moderate Sedation Time: 13 minutes. The patient's level of consciousness and physiologic status were continuously monitored during the procedure by Radiology nursing. PROCEDURE: The procedure, risks, benefits, and alternatives were explained to the patient. Questions regarding the procedure were encouraged and answered. The patient understands and consents to the procedure. A time out was performed prior to initiating the procedure. Ultrasound was performed of the left groin. The left inguinal region was prepped with chlorhexidine in a sterile fashion, and a sterile drape was applied covering the operative field. A sterile gown and sterile gloves were used for the procedure. Local anesthesia was provided with 1% Lidocaine. Under ultrasound guidance, a total of 6 separate 16 gauge core biopsy samples were obtained through different portions of an enlarged left inguinal lymph node. Core biopsy samples were submitted in saline. Additional  ultrasound was performed after biopsy. COMPLICATIONS: None. FINDINGS: Large hypoechoic left inguinal lymph node measures approximately 7 cm in maximum diameter by ultrasound. The lymph node was sampled in several different portions by core biopsy. Solid tissue was obtained. Post biopsy imaging shows no evidence of hemorrhage. IMPRESSION: Ultrasound-guided core biopsy performed of enlarged left inguinal lymph node measuring 7 cm in maximum diameter by ultrasound. Six total 16 gauge core biopsy samples were obtained and submitted in saline. Electronically Signed   By: Aletta Edouard M.D.   On: 12/26/2017 17:06    ASSESSMENT & PLAN:   This is a pleasant 62 y.o. African-American male with a 4-week history of left lower extremity edema   1) Newly diagnosed extensive left inguinal lymphadenopathy, left pelvic and retroperitoneal lymphadenopathy. Clinical and radiographic picture is suggestive of lymphoma unless proven otherwise. Metastatic malignancy with unknown primary a second less likely possibility.  CT of the abdomen and pelvis performed on 12/22/2017 showed bulky left inguinal, left hemipelvic, and  retroperitoneal adenopathy.   HIV non reactive on 12/22/2017  2) left lower extremity swelling  Doppler ultrasound for DVT was negative in the left lower extremity.   Likely from venous compression +/- lymphatic obstruction from bulky left inguinal, left hemipelvic, and  retroperitoneal adenopathy.   PLAN: - We will also obtain a hepatitis panel to evaluate for hepatitis B and C.   -The patient was counseled on smoking cessation.  The patient was advised to cut back on his alcohol intake and to stop using cocaine. -Discussed pt labwork 12/26/17; blood counts are WNL -Discussed the 12/26/17 Tissue flow cytometry and left inguinal core biopsy which revealed concern for a lymphoproliferative process -After speaking with the pathologist, a surgical biopsy is recommended for accurate diagnosis, and I  have spoken with Dr. Donne Hazel regarding this recommendation for an excisional biopsy -The pt will see Dr. Donne Hazel later  today and I will also ask if a Port could be placed simultaneously  -Will see pt back in one week with biopsy and PET/CT result -Begin aspirin after Port placement and biopsy   No orders of the defined types were placed in this encounter.   -PET/CT ASAP -- not scheduled yet from last time. -f/u with Dr Donne Hazel as scheduled today for LN biopsy and port a cath placement -RTC with Dr Irene Limbo in in 8-10 days with labs   All questions were answered. The patient knows to call the clinic with any problems, questions or concerns.  The total time spent in the appt was 25 minutes and more than 50% was on counseling and direct patient cares.    Sullivan Lone MD MS AAHIVMS [facility] Hematology/Oncology Physician [facility]  (Office):       [phone] (Work cell):  [phone] (Fax):           [phone]  I, Baldwin Jamaica, am acting as a scribe for Dr. Irene Limbo  .I have reviewed the above documentation for accuracy and completeness, and I agree with the above. Brunetta Genera MD

## 2017-12-29 ENCOUNTER — Other Ambulatory Visit: Payer: Self-pay | Admitting: General Surgery

## 2018-01-01 ENCOUNTER — Inpatient Hospital Stay (HOSPITAL_BASED_OUTPATIENT_CLINIC_OR_DEPARTMENT_OTHER): Payer: 59 | Admitting: Hematology

## 2018-01-01 ENCOUNTER — Inpatient Hospital Stay: Payer: 59

## 2018-01-01 ENCOUNTER — Other Ambulatory Visit: Payer: Self-pay

## 2018-01-01 ENCOUNTER — Encounter: Payer: Self-pay | Admitting: Hematology

## 2018-01-01 ENCOUNTER — Telehealth: Payer: Self-pay | Admitting: Hematology

## 2018-01-01 ENCOUNTER — Encounter (HOSPITAL_COMMUNITY): Payer: Self-pay | Admitting: *Deleted

## 2018-01-01 ENCOUNTER — Telehealth: Payer: Self-pay | Admitting: Physician Assistant

## 2018-01-01 VITALS — BP 145/88 | HR 67 | Temp 97.8°F | Resp 18 | Ht 73.0 in | Wt 300.1 lb

## 2018-01-01 DIAGNOSIS — K5090 Crohn's disease, unspecified, without complications: Secondary | ICD-10-CM | POA: Diagnosis not present

## 2018-01-01 DIAGNOSIS — R590 Localized enlarged lymph nodes: Secondary | ICD-10-CM | POA: Diagnosis not present

## 2018-01-01 DIAGNOSIS — F17210 Nicotine dependence, cigarettes, uncomplicated: Secondary | ICD-10-CM

## 2018-01-01 DIAGNOSIS — F1410 Cocaine abuse, uncomplicated: Secondary | ICD-10-CM

## 2018-01-01 DIAGNOSIS — R1030 Lower abdominal pain, unspecified: Secondary | ICD-10-CM

## 2018-01-01 DIAGNOSIS — I739 Peripheral vascular disease, unspecified: Secondary | ICD-10-CM

## 2018-01-01 DIAGNOSIS — R7303 Prediabetes: Secondary | ICD-10-CM

## 2018-01-01 DIAGNOSIS — I10 Essential (primary) hypertension: Secondary | ICD-10-CM

## 2018-01-01 DIAGNOSIS — Z79899 Other long term (current) drug therapy: Secondary | ICD-10-CM

## 2018-01-01 DIAGNOSIS — R600 Localized edema: Secondary | ICD-10-CM

## 2018-01-01 DIAGNOSIS — Z7982 Long term (current) use of aspirin: Secondary | ICD-10-CM

## 2018-01-01 DIAGNOSIS — R599 Enlarged lymph nodes, unspecified: Secondary | ICD-10-CM

## 2018-01-01 DIAGNOSIS — E785 Hyperlipidemia, unspecified: Secondary | ICD-10-CM

## 2018-01-01 MED ORDER — DEXTROSE 5 % IV SOLN
3.0000 g | INTRAVENOUS | Status: AC
  Administered 2018-01-02: 3 g via INTRAVENOUS
  Filled 2018-01-01: qty 3

## 2018-01-01 NOTE — Progress Notes (Signed)
Pt denies SOB, chest pain, and being under the care of a cardiologist.Pt denies having a cardiac cath but stated that a stress test was performed > 10 years ago. Pt denies having a chest x ray but stated that an EKG was done when the echo was performed at [facility]; no record of EKG in Epic. Pt stated that he was advised by MD to stop taking Aspirin, last dose was Saturday. Pt also made aware to stop taking vitamins, fish oil and herbal medications. Do not take any NSAIDs ie: Ibuprofen, Advil, Naproxen (Aleve/Naprosyn), Motrin, BC and Goody Powder. Pt verbalized understanding of all pre-op instructions.

## 2018-01-01 NOTE — Progress Notes (Signed)
   01/01/18 1226  OBSTRUCTIVE SLEEP APNEA  Have you ever been diagnosed with sleep apnea through a sleep study? No  Do you snore loudly (loud enough to be heard through closed doors)?  0  Do you often feel tired, fatigued, or sleepy during the daytime (such as falling asleep during driving or talking to someone)? 0  Has anyone observed you stop breathing during your sleep? 0  Do you have, or are you being treated for high blood pressure? 1  BMI more than 35 kg/m2? 1  Age > 50 (1-yes) 1  Neck circumference greater than:Male 16 inches or larger, Male 17inches or larger? 1  Male Gender (Yes=1) 1  Obstructive Sleep Apnea Score 5

## 2018-01-01 NOTE — Telephone Encounter (Signed)
Copied from [place] [id number]. Topic: Inquiry >> Jan 01, 2018  1:23 PM Conception Chancy, NT wrote: Reason for CRM: patient is calling and was seen on 12/22/17 and was sent to the hospital. He states he was not able to go back to work and has been out since. He states he has surgery tomorrow 01/02/18. He states he is needing an excuse note from 8/16-8/23/19 since we are the office that sent him to the hospital. Please advise.

## 2018-01-01 NOTE — Telephone Encounter (Signed)
Appts scheduled AVS Calendar declined due to my chart per 8/26 los

## 2018-01-02 ENCOUNTER — Other Ambulatory Visit: Payer: Self-pay

## 2018-01-02 ENCOUNTER — Encounter (HOSPITAL_COMMUNITY): Payer: Self-pay | Admitting: General Practice

## 2018-01-02 ENCOUNTER — Ambulatory Visit (HOSPITAL_COMMUNITY): Payer: 59 | Admitting: Anesthesiology

## 2018-01-02 ENCOUNTER — Encounter (HOSPITAL_COMMUNITY)
Admission: RE | Disposition: A | Discharge status: Home / Self Care | Payer: Self-pay | Source: Ambulatory Visit | Attending: General Surgery

## 2018-01-02 ENCOUNTER — Ambulatory Visit (HOSPITAL_COMMUNITY)
Admission: RE | Admit: 2018-01-02 | Discharge: 2018-01-02 | Disposition: A | Discharge status: Home / Self Care | Payer: 59 | Source: Ambulatory Visit | Attending: General Surgery | Admitting: General Surgery

## 2018-01-02 DIAGNOSIS — F17210 Nicotine dependence, cigarettes, uncomplicated: Secondary | ICD-10-CM | POA: Diagnosis not present

## 2018-01-02 DIAGNOSIS — E785 Hyperlipidemia, unspecified: Secondary | ICD-10-CM | POA: Insufficient documentation

## 2018-01-02 DIAGNOSIS — Z6839 Body mass index (BMI) 39.0-39.9, adult: Secondary | ICD-10-CM | POA: Diagnosis not present

## 2018-01-02 DIAGNOSIS — Z8249 Family history of ischemic heart disease and other diseases of the circulatory system: Secondary | ICD-10-CM | POA: Insufficient documentation

## 2018-01-02 DIAGNOSIS — Z91030 Bee allergy status: Secondary | ICD-10-CM | POA: Diagnosis not present

## 2018-01-02 DIAGNOSIS — Z800 Family history of malignant neoplasm of digestive organs: Secondary | ICD-10-CM | POA: Insufficient documentation

## 2018-01-02 DIAGNOSIS — C8335 Diffuse large B-cell lymphoma, lymph nodes of inguinal region and lower limb: Secondary | ICD-10-CM | POA: Diagnosis not present

## 2018-01-02 DIAGNOSIS — C8515 Unspecified B-cell lymphoma, lymph nodes of inguinal region and lower limb: Secondary | ICD-10-CM | POA: Diagnosis not present

## 2018-01-02 DIAGNOSIS — Z87892 Personal history of anaphylaxis: Secondary | ICD-10-CM | POA: Insufficient documentation

## 2018-01-02 DIAGNOSIS — Z961 Presence of intraocular lens: Secondary | ICD-10-CM | POA: Diagnosis not present

## 2018-01-02 DIAGNOSIS — Z7982 Long term (current) use of aspirin: Secondary | ICD-10-CM | POA: Insufficient documentation

## 2018-01-02 DIAGNOSIS — Z823 Family history of stroke: Secondary | ICD-10-CM | POA: Insufficient documentation

## 2018-01-02 DIAGNOSIS — I10 Essential (primary) hypertension: Secondary | ICD-10-CM | POA: Diagnosis not present

## 2018-01-02 DIAGNOSIS — Z9842 Cataract extraction status, left eye: Secondary | ICD-10-CM | POA: Insufficient documentation

## 2018-01-02 DIAGNOSIS — I739 Peripheral vascular disease, unspecified: Secondary | ICD-10-CM | POA: Insufficient documentation

## 2018-01-02 DIAGNOSIS — Z79899 Other long term (current) drug therapy: Secondary | ICD-10-CM | POA: Insufficient documentation

## 2018-01-02 DIAGNOSIS — Z791 Long term (current) use of non-steroidal anti-inflammatories (NSAID): Secondary | ICD-10-CM | POA: Insufficient documentation

## 2018-01-02 DIAGNOSIS — Z9841 Cataract extraction status, right eye: Secondary | ICD-10-CM | POA: Diagnosis not present

## 2018-01-02 DIAGNOSIS — H5350 Unspecified color vision deficiencies: Secondary | ICD-10-CM | POA: Diagnosis not present

## 2018-01-02 DIAGNOSIS — R590 Localized enlarged lymph nodes: Secondary | ICD-10-CM | POA: Diagnosis not present

## 2018-01-02 DIAGNOSIS — N529 Male erectile dysfunction, unspecified: Secondary | ICD-10-CM | POA: Insufficient documentation

## 2018-01-02 DIAGNOSIS — R7303 Prediabetes: Secondary | ICD-10-CM | POA: Insufficient documentation

## 2018-01-02 HISTORY — DX: Personal history of urinary calculi: Z87.442

## 2018-01-02 HISTORY — DX: Generalized enlarged lymph nodes: R59.1

## 2018-01-02 HISTORY — PX: INGUINAL LYMPH NODE BIOPSY: SHX5865

## 2018-01-02 LAB — HEPATITIS PANEL, ACUTE
HCV Ab: <0.1 s/co ratio (ref 0.0–0.9)
Hep A IgM: NEGATIVE
Hep B C IgM: NEGATIVE
Hepatitis B Surface Ag: NEGATIVE

## 2018-01-02 LAB — GLUCOSE, CAPILLARY
Glucose-Capillary: 112 mg/dL — ABNORMAL HIGH (ref 70–99)
Glucose-Capillary: 108 mg/dL — ABNORMAL HIGH (ref 70–99)

## 2018-01-02 SURGERY — BIOPSY, LYMPH NODE, INGUINAL, OPEN
Anesthesia: General | Site: Inguinal | Laterality: Left

## 2018-01-02 MED ORDER — ONDANSETRON HCL 4 MG/2ML IJ SOLN
INTRAMUSCULAR | Status: DC | PRN
  Administered 2018-01-02: 4 mg via INTRAVENOUS

## 2018-01-02 MED ORDER — BUPIVACAINE-EPINEPHRINE 0.25% -1:200000 IJ SOLN
INTRAMUSCULAR | Status: DC | PRN
  Administered 2018-01-02: 7 mL

## 2018-01-02 MED ORDER — PROPOFOL 10 MG/ML IV BOLUS
INTRAVENOUS | Status: AC
  Filled 2018-01-02: qty 40

## 2018-01-02 MED ORDER — SODIUM CHLORIDE 0.9% FLUSH: 3.0000 mL | Freq: Two times a day (BID) | INTRAVENOUS | Status: DC

## 2018-01-02 MED ORDER — SODIUM CHLORIDE 0.9% FLUSH: 3.0000 mL | INTRAVENOUS | Status: DC | PRN

## 2018-01-02 MED ORDER — MIDAZOLAM HCL 2 MG/2ML IJ SOLN
INTRAMUSCULAR | Status: DC | PRN
  Administered 2018-01-02: 2 mg via INTRAVENOUS

## 2018-01-02 MED ORDER — ONDANSETRON HCL 4 MG/2ML IJ SOLN
INTRAMUSCULAR | Status: AC
  Filled 2018-01-02: qty 2

## 2018-01-02 MED ORDER — SODIUM CHLORIDE 0.9 % IV SOLN: 250.0000 mL | INTRAVENOUS | Status: DC | PRN

## 2018-01-02 MED ORDER — ACETAMINOPHEN 650 MG RE SUPP: 650.0000 mg | RECTAL | Status: DC | PRN

## 2018-01-02 MED ORDER — MIDAZOLAM HCL 2 MG/2ML IJ SOLN
INTRAMUSCULAR | Status: AC
  Filled 2018-01-02: qty 2

## 2018-01-02 MED ORDER — DEXAMETHASONE SODIUM PHOSPHATE 10 MG/ML IJ SOLN
INTRAMUSCULAR | Status: AC
  Filled 2018-01-02: qty 1

## 2018-01-02 MED ORDER — PROMETHAZINE HCL 25 MG/ML IJ SOLN: 6.2500 mg | INTRAMUSCULAR | Status: DC | PRN

## 2018-01-02 MED ORDER — HEMOSTATIC AGENTS (NO CHARGE) OPTIME
TOPICAL | Status: DC | PRN
  Administered 2018-01-02: 1 via TOPICAL

## 2018-01-02 MED ORDER — BUPIVACAINE-EPINEPHRINE (PF) 0.25% -1:200000 IJ SOLN
INTRAMUSCULAR | Status: AC
  Filled 2018-01-02: qty 30

## 2018-01-02 MED ORDER — HYDRALAZINE HCL 20 MG/ML IJ SOLN
INTRAMUSCULAR | Status: AC
  Filled 2018-01-02: qty 1

## 2018-01-02 MED ORDER — SODIUM CHLORIDE 0.9 % IV SOLN: INTRAVENOUS | Status: DC

## 2018-01-02 MED ORDER — OXYCODONE HCL 5 MG PO TABS: 5.0000 mg | ORAL_TABLET | ORAL | Status: DC | PRN

## 2018-01-02 MED ORDER — GABAPENTIN 100 MG PO CAPS
100.0000 mg | ORAL_CAPSULE | ORAL | Status: AC
  Administered 2018-01-02: 100 mg via ORAL
  Filled 2018-01-02: qty 1

## 2018-01-02 MED ORDER — ACETAMINOPHEN 500 MG PO TABS: 1000.0000 mg | ORAL_TABLET | Freq: Four times a day (QID) | ORAL | Status: DC

## 2018-01-02 MED ORDER — LIDOCAINE 2% (20 MG/ML) 5 ML SYRINGE
INTRAMUSCULAR | Status: DC | PRN
  Administered 2018-01-02: 100 mg via INTRAVENOUS

## 2018-01-02 MED ORDER — HYDROMORPHONE HCL 1 MG/ML IJ SOLN: 0.2500 mg | INTRAMUSCULAR | Status: DC | PRN

## 2018-01-02 MED ORDER — PROPOFOL 10 MG/ML IV BOLUS
INTRAVENOUS | Status: DC | PRN
  Administered 2018-01-02: 200 mg via INTRAVENOUS

## 2018-01-02 MED ORDER — ACETAMINOPHEN 500 MG PO TABS
1000.0000 mg | ORAL_TABLET | ORAL | Status: AC
  Administered 2018-01-02: 1000 mg via ORAL
  Filled 2018-01-02: qty 2

## 2018-01-02 MED ORDER — DEXAMETHASONE SODIUM PHOSPHATE 10 MG/ML IJ SOLN
INTRAMUSCULAR | Status: DC | PRN
  Administered 2018-01-02: 5 mg via INTRAVENOUS

## 2018-01-02 MED ORDER — FENTANYL CITRATE (PF) 250 MCG/5ML IJ SOLN
INTRAMUSCULAR | Status: AC
  Filled 2018-01-02: qty 5

## 2018-01-02 MED ORDER — ACETAMINOPHEN 325 MG PO TABS: 650.0000 mg | ORAL_TABLET | ORAL | Status: DC | PRN

## 2018-01-02 MED ORDER — MORPHINE SULFATE (PF) 2 MG/ML IV SOLN: 2.0000 mg | INTRAVENOUS | Status: DC | PRN

## 2018-01-02 MED ORDER — ROCURONIUM BROMIDE 50 MG/5ML IV SOSY
PREFILLED_SYRINGE | INTRAVENOUS | Status: AC
  Filled 2018-01-02: qty 5

## 2018-01-02 MED ORDER — CELECOXIB 200 MG PO CAPS
200.0000 mg | ORAL_CAPSULE | ORAL | Status: AC
  Administered 2018-01-02: 200 mg via ORAL
  Filled 2018-01-02: qty 1

## 2018-01-02 MED ORDER — LACTATED RINGERS IV SOLN
INTRAVENOUS | Status: DC
  Administered 2018-01-02: 11:00:00 via INTRAVENOUS

## 2018-01-02 MED ORDER — PROPOFOL 10 MG/ML IV BOLUS
INTRAVENOUS | Status: AC
  Filled 2018-01-02: qty 20

## 2018-01-02 MED ORDER — TRAMADOL HCL 50 MG PO TABS: 100.0000 mg | ORAL_TABLET | Freq: Four times a day (QID) | ORAL | 0 refills | Status: DC | PRN

## 2018-01-02 MED ORDER — FENTANYL CITRATE (PF) 250 MCG/5ML IJ SOLN
INTRAMUSCULAR | Status: DC | PRN
  Administered 2018-01-02: 25 ug via INTRAVENOUS
  Administered 2018-01-02 (×2): 50 ug via INTRAVENOUS

## 2018-01-02 MED ORDER — 0.9 % SODIUM CHLORIDE (POUR BTL) OPTIME
TOPICAL | Status: DC | PRN
  Administered 2018-01-02: 1000 mL

## 2018-01-02 MED ORDER — LIDOCAINE 2% (20 MG/ML) 5 ML SYRINGE
INTRAMUSCULAR | Status: AC
  Filled 2018-01-02: qty 5

## 2018-01-02 SURGICAL SUPPLY — 35 items
CHLORAPREP W/TINT 26ML (MISCELLANEOUS) ×2 IMPLANT
CONT SPEC 4OZ CLIKSEAL STRL BL (MISCELLANEOUS) ×2 IMPLANT
COVER SURGICAL LIGHT HANDLE (MISCELLANEOUS) ×2 IMPLANT
DECANTER SPIKE VIAL GLASS SM (MISCELLANEOUS) ×2 IMPLANT
DERMABOND ADVANCED (GAUZE/BANDAGES/DRESSINGS) ×1
DERMABOND ADVANCED .7 DNX12 (GAUZE/BANDAGES/DRESSINGS) ×1 IMPLANT
DRAPE LAPAROTOMY 100X72 PEDS (DRAPES) ×2 IMPLANT
ELECT CAUTERY BLADE 6.4 (BLADE) ×2 IMPLANT
ELECT REM PT RETURN 9FT ADLT (ELECTROSURGICAL) ×2
ELECTRODE REM PT RTRN 9FT ADLT (ELECTROSURGICAL) ×1 IMPLANT
GAUZE 4X4 16PLY RFD (DISPOSABLE) ×2 IMPLANT
GLOVE BIO SURGEON STRL SZ7 (GLOVE) ×2 IMPLANT
GLOVE BIOGEL PI IND STRL 6.5 (GLOVE) ×2 IMPLANT
GLOVE BIOGEL PI IND STRL 7.0 (GLOVE) ×1 IMPLANT
GLOVE BIOGEL PI IND STRL 7.5 (GLOVE) ×1 IMPLANT
GLOVE BIOGEL PI INDICATOR 6.5 (GLOVE) ×2
GLOVE BIOGEL PI INDICATOR 7.0 (GLOVE) ×1
GLOVE BIOGEL PI INDICATOR 7.5 (GLOVE) ×1
GOWN STRL REUS W/ TWL LRG LVL3 (GOWN DISPOSABLE) ×3 IMPLANT
GOWN STRL REUS W/TWL LRG LVL3 (GOWN DISPOSABLE) ×3
KIT BASIN OR (CUSTOM PROCEDURE TRAY) ×2 IMPLANT
KIT TURNOVER KIT B (KITS) ×2 IMPLANT
NEEDLE HYPO 25GX1X1/2 BEV (NEEDLE) ×2 IMPLANT
NS IRRIG 1000ML POUR BTL (IV SOLUTION) ×2 IMPLANT
PACK SURGICAL SETUP 50X90 (CUSTOM PROCEDURE TRAY) ×2 IMPLANT
PAD ARMBOARD 7.5X6 YLW CONV (MISCELLANEOUS) ×2 IMPLANT
PENCIL BUTTON HOLSTER BLD 10FT (ELECTRODE) ×2 IMPLANT
SUT MNCRL AB 4-0 PS2 18 (SUTURE) ×2 IMPLANT
SUT VIC AB 2-0 CT1 27 (SUTURE) ×1
SUT VIC AB 2-0 CT1 TAPERPNT 27 (SUTURE) ×1 IMPLANT
SUT VIC AB 3-0 SH 27 (SUTURE) ×1
SUT VIC AB 3-0 SH 27XBRD (SUTURE) ×1 IMPLANT
SYR CONTROL 10ML LL (SYRINGE) ×2 IMPLANT
TOWEL OR 17X24 6PK STRL BLUE (TOWEL DISPOSABLE) ×2 IMPLANT
TOWEL OR 17X26 10 PK STRL BLUE (TOWEL DISPOSABLE) ×2 IMPLANT

## 2018-01-02 NOTE — Anesthesia Preprocedure Evaluation (Signed)
Anesthesia Evaluation  Patient identified by MRN, date of birth, ID band Patient awake    Reviewed: Allergy & Precautions, NPO status , Patient's Chart, lab work & pertinent test results  History of Anesthesia Complications Negative for: history of anesthetic complications  Airway Mallampati: III  TM Distance: >3 FB Neck ROM: Full    Dental no notable dental hx. (+) Dental Advisory Given   Pulmonary Current Smoker,    Pulmonary exam normal        Cardiovascular hypertension, Pt. on medications + Peripheral Vascular Disease  Normal cardiovascular exam     Neuro/Psych negative neurological ROS  negative psych ROS   GI/Hepatic negative GI ROS, Neg liver ROS,   Endo/Other  Morbid obesity  Renal/GU      Musculoskeletal   Abdominal   Peds  Hematology   Anesthesia Other Findings   Reproductive/Obstetrics                             Anesthesia Physical Anesthesia Plan  ASA: III  Anesthesia Plan: General   Post-op Pain Management:    Induction: Intravenous  PONV Risk Score and Plan: 2 and Ondansetron and Dexamethasone  Airway Management Planned: LMA  Additional Equipment:   Intra-op Plan:   Post-operative Plan: Extubation in OR  Informed Consent: I have reviewed the patients History and Physical, chart, labs and discussed the procedure including the risks, benefits and alternatives for the proposed anesthesia with the patient or authorized representative who has indicated his/her understanding and acceptance.   Dental advisory given  Plan Discussed with: CRNA and Anesthesiologist  Anesthesia Plan Comments:         Anesthesia Quick Evaluation

## 2018-01-02 NOTE — Anesthesia Procedure Notes (Signed)
Procedure Name: LMA Insertion Date/Time: 01/02/2018 1:57 PM Performed by: Verdie Drown, CRNA Pre-anesthesia Checklist: Patient identified, Emergency Drugs available, Suction available and Patient being monitored Patient Re-evaluated:Patient Re-evaluated prior to induction Oxygen Delivery Method: Circle System Utilized Preoxygenation: Pre-oxygenation with 100% oxygen Induction Type: IV induction Ventilation: Mask ventilation without difficulty LMA: LMA inserted LMA Size: 5.0 Number of attempts: 1 Placement Confirmation: positive ETCO2 Tube secured with: Tape Dental Injury: Teeth and Oropharynx as per pre-operative assessment

## 2018-01-02 NOTE — Interval H&P Note (Signed)
History and Physical Interval Note:  01/02/2018 10:27 AM  Jesus [name]  has presented today for surgery, with the diagnosis of [name]  The various methods of treatment have been discussed with the patient and family. After consideration of risks, benefits and other options for treatment, the patient has consented to  Procedure(s): LEFT INGUINAL LYMPH NODE BIOPSY (Left) as a surgical intervention .  The patient's history has been reviewed, patient examined, no change in status, stable for surgery.  I have reviewed the patient's chart and labs.  Questions were answered to the patient's satisfaction.     Rolm Bookbinder

## 2018-01-02 NOTE — Op Note (Signed)
Preoperative diagnosis: Left inguinal and left pelvic lymphadenopathy Postoperative diagnosis: Same as above Procedure: Left inguinal lymphadenopathy incisional biopsy Surgeon: Dr. Serita Grammes Anesthesia: General Specimens: Left inguinal lymph node incisional biopsy sent fresh Estimated blood loss: Minimal Complications: None Drains: None Sponge needle count was correct x2 at completion Decision to recovery stable  Indications: This is 62 year old male who has had left lower extremity swelling and presented to the emergency room.  He was noted to have left inguinal adenopathy as well as left hemipelvis and left retroperitoneal adenopathy.  He underwent a nondiagnostic ultrasound-guided biopsy and is referred to me for biopsy to get a diagnosis of this lymphadenopathy.  Procedure: After informed consent was obtained the patient was taken to the operating room.  He was given antibiotics.  SCDs were in place.   he was then placed under general anesthesia without complication.  He was prepped and draped in the standard sterile surgical fashion.  A surgical timeout was then performed.  I then infiltrated Marcaine overlying the left inguinal node.  I made a transverse incision overlying the node.  I dissected down to view the surface of the node.  I then elected to perform an incisional biopsy as this was large and connected other lymph nodes that were much deeper.  I took out several centimeter plus size pieces of this lymph node and sent them to pathology.  I then obtained hemostasis.  I did place some Surgicel over some raw surfaces.  I then closed this with 2-0 Vicryl, 3-0 Vicryl, and 4-0 Monocryl.  Glue was placed.  He tolerated well was transferred to recovery stable.

## 2018-01-02 NOTE — Anesthesia Postprocedure Evaluation (Signed)
Anesthesia Post Note  Patient: Jesus [name]  Procedure(s) Performed: LEFT INGUINAL LYMPH NODE BIOPSY (Left Inguinal)     Patient location during evaluation: PACU Anesthesia Type: General Level of consciousness: sedated Pain management: pain level controlled Vital Signs Assessment: post-procedure vital signs reviewed and stable Respiratory status: spontaneous breathing and respiratory function stable Cardiovascular status: stable Postop Assessment: no apparent nausea or vomiting Anesthetic complications: no    Last Vitals:  Vitals:   01/02/18 1452 01/02/18 1506  BP: 135/84 139/85  Pulse: 68 64  Resp: 14 16  Temp:  (!) 36.4 C  SpO2: 95% 97%    Last Pain:  Vitals:   01/02/18 1506  TempSrc:   PainSc: 0-No pain                 [name]

## 2018-01-02 NOTE — Discharge Instructions (Signed)
[facility]  POST OP INSTRUCTIONS  Always review your discharge instruction sheet given to you by the facility where your surgery was performed. IF YOU HAVE DISABILITY OR FAMILY LEAVE FORMS, YOU MUST BRING THEM TO THE OFFICE FOR PROCESSING.   DO NOT GIVE THEM TO YOUR DOCTOR.  1. A  prescription for pain medication may be given to you upon discharge.  Take your pain medication as prescribed, if needed.  If narcotic pain medicine is not needed, then you may take acetaminophen (Tylenol), naprosyn (Alleve) or ibuprofen (Advil) as needed. 2. Take your usually prescribed medications unless otherwise directed. 3. If you need a refill on your pain medication, please contact your pharmacy.  They will contact our office to request authorization. Prescriptions will not be filled after 5 pm or on week-ends. 4. You should follow a light diet the first 24 hours after arrival home, such as soup and crackers, etc.  Be sure to include lots of fluids daily.  Resume your normal diet the day after surgery. 5. Most patients will experience some swelling and bruising around the umbilicus or in the groin and scrotum.  Ice packs and reclining will help.  Swelling and bruising can take several days to resolve.  6. It is common to experience some constipation if taking pain medication after surgery.  Increasing fluid intake and taking a stool softener (such as Colace) will usually help or prevent this problem from occurring.  A mild laxative (Milk of Magnesia or Miralax) should be taken according to package directions if there are no bowel movements after 48 hours. 7. Unless discharge instructions indicate otherwise, you may remove your bandages 48 hours after surgery, and you may shower at that time.  You may have steri-strips (small skin tapes) in place directly over the incision.  These strips should be left on the skin for 7-10 days and will come off on their own.  If your surgeon used skin glue on the  incision, you may shower in 24 hours.  The glue will flake off over the next 2-3 weeks.  Any sutures or staples will be removed at the office during your follow-up visit. 8. ACTIVITIES:  You may resume regular (light) daily activities beginning the next day--such as daily self-care, walking, climbing stairs--gradually increasing activities as tolerated.  You may have sexual intercourse when it is comfortable.  Refrain from any heavy lifting or straining until approved by your doctor. a. You may drive when you are no longer taking prescription pain medication, you can comfortably wear a seatbelt, and you can safely maneuver your car and apply brakes. b. RETURN TO WORK:  __________________________________________________________ 9. You should see your doctor in the office for a follow-up appointment approximately 2-3 weeks after your surgery.  Make sure that you call for this appointment within a day or two after you arrive home to insure a convenient appointment time. 10. OTHER INSTRUCTIONS:  __________________________________________________________________________________________________________________________________________________________________________________________  WHEN TO CALL YOUR DOCTOR: 1. Fever over 101.0 2. Inability to urinate 3. Nausea and/or vomiting 4. Extreme swelling or bruising 5. Continued bleeding from incision. 6. Increased pain, redness, or drainage from the incision  The clinic staff is available to answer your questions during regular business hours.  Please dont hesitate to call and ask to speak to one of the nurses for clinical concerns.  If you have a medical emergency, go to the nearest emergency room or call 911.  A surgeon from [facility] is always on call at  the hospital   [address], [place], [place], [place]  [address] ?  [address], [place], [place]   [address] [phone] ? [phone] ? FAX [phone] Web site:  [email or web address]

## 2018-01-02 NOTE — Transfer of Care (Signed)
Immediate Anesthesia Transfer of Care Note  Patient: Jesus [name]  Procedure(s) Performed: LEFT INGUINAL LYMPH NODE BIOPSY (Left Inguinal)  Patient Location: PACU  Anesthesia Type:General  Level of Consciousness: awake, alert  and oriented  Airway & Oxygen Therapy: Patient Spontanous Breathing and Patient connected to nasal cannula oxygen  Post-op Assessment: Report given to RN and Post -op Vital signs reviewed and stable  Post vital signs: Reviewed and stable  Last Vitals:  Vitals Value Taken Time  BP 142/89 01/02/2018  2:36 PM  Temp 36.6 C 01/02/2018  2:36 PM  Pulse 85 01/02/2018  2:40 PM  Resp 16 01/02/2018  2:40 PM  SpO2 92 % 01/02/2018  2:40 PM  Vitals shown include unvalidated device data.  Last Pain:  Vitals:   01/02/18 1101  TempSrc:   PainSc: 1       Patients Stated Pain Goal: 2 (35/78/97 [id number])  Complications: No apparent anesthesia complications

## 2018-01-02 NOTE — H&P (Signed)
Jesus [name] is an 62 y.o. male.   Chief Complaint: lymphadenopathy HPI: 68 yom who has noted for past month or so enlarging left groin mass.  He was seen in urgent care and then sent to the er. He underwent ct evaluation that showed left inguinal, pelvic and rp adenopathy.  US of the lle shows no dvt. He does have some night sweats. No fevers, no weight loss.  He has some headahces. He underwent a left groin US biopsy that is not completely diagnostic he presents to discuss open biopsy.   Past Medical History:  Diagnosis Date  . Allergy   . History of kidney stones   . Hyperlipidemia   . Hypertension   . Lymphadenopathy   . Pain, lower leg    Bilateral  . Peripheral arterial disease ([place])   . Pre-diabetes   . Red-green color blindness   . Snores   . Wears glasses     Past Surgical History:  Procedure Laterality Date  . CATARACT EXTRACTION W/ INTRAOCULAR LENS  IMPLANT, BILATERAL    . COLONOSCOPY    . dislodged salava stone    . FRACTURE SURGERY    . HAND ARTHROPLASTY  1995   crushed left hand  . MICROLARYNGOSCOPY Left 01/17/2014   Procedure: MICROLARYNGOSCOPY WITH EXCISION OF THE BIOPSY OF LEFT VOCAL CORD LESION;  Surgeon: Izora Gala, MD;  Location: [facility];  Service: ENT;  Laterality: Left;  . ORIF FOOT FRACTURE  2005   left    Family History  Problem Relation Age of Onset  . Breast cancer Mother   . Diabetes Father   . Hypertension Father   . Stroke Father   . Mental illness Sister   . Hypertension Daughter   . Mental illness Daughter   . Hypertension Brother   . Colon cancer Brother   . Breast cancer Sister    Social History:  reports that he has been smoking cigarettes. He has a 18.00 pack-year smoking history. He has never used smokeless tobacco. He reports that he drinks about 15.0 standard drinks of alcohol per week. He reports that he has current or past drug history. Drugs:  and Cocaine.  Allergies:  Allergies  Allergen Reactions  .  Bee Venom Anaphylaxis    Medications Prior to Admission  Medication Sig Dispense Refill  . amLODipine (NORVASC) 5 MG tablet Take 5 mg by mouth daily.     Marland Kitchen aspirin 81 MG tablet Take 81 mg by mouth daily.    Marland Kitchen lisinopril (PRINIVIL,ZESTRIL) 20 MG tablet Take 20 mg by mouth daily.    . Multiple Vitamins-Minerals (MULTIVITAMIN WITH MINERALS) tablet Take 1 tablet by mouth daily.    . naproxen (NAPROSYN) 500 MG tablet Take 500 mg by mouth 2 (two) times daily as needed for moderate pain.     Marland Kitchen CIALIS 20 MG tablet Take 20 mg by mouth daily as needed for erectile dysfunction.   0    Results for orders placed or performed in visit on 01/01/18 (from the past 48 hour(s))  Hepatitis panel, acute     Status: None   Collection Time: 01/01/18  8:36 AM  Result Value Ref Range   Hepatitis B Surface Ag Negative Negative   HCV Ab <0.1 0.0 - 0.9 s/co ratio    Comment: (NOTE)  Negative:     < 0.8                             Indeterminate: 0.8 - 0.9                                  Positive:     > 0.9 The CDC recommends that a positive HCV antibody result be followed up with a HCV Nucleic Acid Amplification test ([id number]). Performed At: [facility] [place], [place] [id number] Rush Farmer MD JF:[id number]    Hep A IgM Negative Negative   Hep B C IgM Negative Negative   No results found.  Review of Systems  Constitutional: Positive for malaise/fatigue.  Cardiovascular: Positive for leg swelling.  All other systems reviewed and are negative.   There were no vitals taken for this visit. Physical Exam  Vitals reviewed. Constitutional: He is oriented to person, place, and time. He appears well-developed and well-nourished.  HENT:  Head: Normocephalic and atraumatic.  Right Ear: External ear normal.  Left Ear: External ear normal.  Mouth/Throat: Oropharynx is clear and moist.  Eyes: No scleral icterus.  Neck: Neck supple.   Cardiovascular: Normal rate, regular rhythm, normal heart sounds and intact distal pulses.  Respiratory: Effort normal and breath sounds normal. He has no wheezes.  GI: There is no tenderness.  Large left groin mass c/w adenopathy, no hernia   Lymphadenopathy:    He has no cervical adenopathy.  Neurological: He is alert and oriented to person, place, and time.  Skin: Skin is warm and dry.     Assessment/Plan Lymphadenopathy  Plan for open biopsy of left groin lad to give path for further treatment. Discussed risks, wound complications. Will proceed today  Rolm Bookbinder, MD 01/02/2018, 10:24 AM

## 2018-01-03 ENCOUNTER — Encounter (HOSPITAL_COMMUNITY): Payer: Self-pay | Admitting: General Surgery

## 2018-01-03 NOTE — Telephone Encounter (Signed)
Please compose note per patient specs. We can complete fmla if needed.

## 2018-01-04 ENCOUNTER — Emergency Department (HOSPITAL_COMMUNITY)
Admission: EM | Admit: 2018-01-04 | Discharge: 2018-01-04 | Disposition: A | Discharge status: Home / Self Care | Payer: 59 | Attending: Emergency Medicine | Admitting: Emergency Medicine

## 2018-01-04 ENCOUNTER — Encounter (HOSPITAL_COMMUNITY): Payer: Self-pay | Admitting: Emergency Medicine

## 2018-01-04 ENCOUNTER — Encounter: Payer: Self-pay | Admitting: *Deleted

## 2018-01-04 DIAGNOSIS — Z4889 Encounter for other specified surgical aftercare: Secondary | ICD-10-CM | POA: Diagnosis not present

## 2018-01-04 DIAGNOSIS — F1410 Cocaine abuse, uncomplicated: Secondary | ICD-10-CM | POA: Diagnosis not present

## 2018-01-04 DIAGNOSIS — F17210 Nicotine dependence, cigarettes, uncomplicated: Secondary | ICD-10-CM | POA: Insufficient documentation

## 2018-01-04 DIAGNOSIS — Z4801 Encounter for change or removal of surgical wound dressing: Secondary | ICD-10-CM | POA: Diagnosis not present

## 2018-01-04 DIAGNOSIS — I10 Essential (primary) hypertension: Secondary | ICD-10-CM | POA: Insufficient documentation

## 2018-01-04 DIAGNOSIS — G8918 Other acute postprocedural pain: Secondary | ICD-10-CM | POA: Diagnosis not present

## 2018-01-04 LAB — CBC WITH DIFFERENTIAL/PLATELET
Abs Immature Granulocytes: 0 10*3/uL (ref 0.0–0.1)
Basophils Absolute: 0.1 10*3/uL (ref 0.0–0.1)
Basophils Relative: 1 %
Eosinophils Absolute: 0.2 10*3/uL (ref 0.0–0.7)
Eosinophils Relative: 4 %
HCT: 40.2 % (ref 39.0–52.0)
Hemoglobin: 12.7 g/dL — ABNORMAL LOW (ref 13.0–17.0)
Immature Granulocytes: 0 %
Lymphocytes Relative: 28 %
Lymphs Abs: 1.5 10*3/uL (ref 0.7–4.0)
MCH: 29.3 pg (ref 26.0–34.0)
MCHC: 31.6 g/dL (ref 30.0–36.0)
MCV: 92.8 fL (ref 78.0–100.0)
Monocytes Absolute: 0.7 10*3/uL (ref 0.1–1.0)
Monocytes Relative: 14 %
Neutro Abs: 2.8 10*3/uL (ref 1.7–7.7)
Neutrophils Relative %: 53 %
Platelets: 236 10*3/uL (ref 150–400)
RBC: 4.33 MIL/uL (ref 4.22–5.81)
RDW: 13.5 % (ref 11.5–15.5)
WBC: 5.4 10*3/uL (ref 4.0–10.5)

## 2018-01-04 LAB — BASIC METABOLIC PANEL
Anion gap: 9 (ref 5–15)
CO2: 26 mmol/L (ref 22–32)
Calcium: 9.9 mg/dL (ref 8.9–10.3)
Chloride: 104 mmol/L (ref 98–111)
Creatinine, Ser: 1 mg/dL (ref 0.61–1.24)
GFR calc Af Amer: >60 mL/min (ref >60)
GFR calc non Af Amer: >60 mL/min (ref >60)
Glucose, Bld: 148 mg/dL — ABNORMAL HIGH (ref 70–99)
Sodium: 139 mmol/L (ref 135–145)

## 2018-01-04 LAB — BASIC METABOLIC PANEL WITH GFR
BUN: 13 mg/dL (ref 8–23)
Potassium: 3.5 mmol/L (ref 3.5–5.1)

## 2018-01-04 NOTE — ED Provider Notes (Signed)
Patient placed in Quick Look pathway, seen and evaluated   Chief Complaint: post op pain  HPI:   Jesus [name] is a 62 y.o. male who presents to the ED with pain, swelling and tenderness to the left lower abdomen where he reports he had a biopsy of lymphnode 3 days ago trying to r/o cancer. The redness, tenderness and drainage started yesterday. Patient reports applying ice and that it got worse.   ROS: Skin: wound left lower abdomen  Physical Exam:  BP (!) 157/82 (BP Location: Right Arm)   Pulse (!) 102   Temp 98.7 F (37.1 C) (Oral)   Resp 20   SpO2 97%    Gen: No distress  Neuro: Awake and Alert  Skin: wound left lower abdomen with tenderness, healing without drainage noted.    Initiation of care has begun. The patient has been counseled on the process, plan, and necessity for staying for the completion/evaluation, and the remainder of the medical screening examination    Ashley Murrain, NP 01/04/18 1949    Hayden Rasmussen, MD 01/05/18 [phone]

## 2018-01-04 NOTE — Telephone Encounter (Signed)
Out of work note sent via [place] per Philis Fendt PA-C

## 2018-01-04 NOTE — ED Provider Notes (Signed)
[facility] Emergency Department Provider Note MRN:  [id number]  Arrival date & time: 01/04/18     Chief Complaint   Groin Pain and Post-op Problem   History of Present Illness   Jesus [name] is a 62 y.o. year-old male with a history of bulky lymphadenopathy concerning for lymphoma presenting to the ED with chief complaint of postop swelling.  Patient has had recent left leg swelling and an associated nodule in his left groin.  Recent ED visit and CT revealed bulky lymphadenopathy.  Patient has follow-up with oncology and general surgery.  Has had 2 different biopsy procedures, the last one occurring 2 days ago.  Endorsing increased swelling to the area, more specifically the left groin.  Pain in the area is mild, not getting worse.  Small amount of bloody discharge today.  Denies fevers, no chest pain, no abdominal pain.  Review of Systems  A complete 10 system review of systems was obtained and all systems are negative except as noted in the HPI and PMH.   Patient's Health History    Past Medical History:  Diagnosis Date  . Allergy   . History of kidney stones   . Hyperlipidemia   . Hypertension   . Lymphadenopathy   . Pain, lower leg    Bilateral  . Peripheral arterial disease ([place])   . Pre-diabetes   . Red-green color blindness   . Snores   . Wears glasses     Past Surgical History:  Procedure Laterality Date  . CATARACT EXTRACTION W/ INTRAOCULAR LENS  IMPLANT, BILATERAL    . COLONOSCOPY    . dislodged salava stone    . FRACTURE SURGERY    . HAND ARTHROPLASTY  1995   crushed left hand  . INGUINAL LYMPH NODE BIOPSY Left 01/02/2018   Procedure: LEFT INGUINAL LYMPH NODE BIOPSY;  Surgeon: Rolm Bookbinder, MD;  Location: [place];  Service: General;  Laterality: Left;  Marland Kitchen MICROLARYNGOSCOPY Left 01/17/2014   Procedure: MICROLARYNGOSCOPY WITH EXCISION OF THE BIOPSY OF LEFT VOCAL CORD LESION;  Surgeon: Izora Gala, MD;  Location: [facility];   Service: ENT;  Laterality: Left;  . ORIF FOOT FRACTURE  2005   left    Family History  Problem Relation Age of Onset  . Breast cancer Mother   . Diabetes Father   . Hypertension Father   . Stroke Father   . Mental illness Sister   . Hypertension Daughter   . Mental illness Daughter   . Hypertension Brother   . Colon cancer Brother   . Breast cancer Sister     Social History   Socioeconomic History  . Marital status: Divorced    Spouse name: Not on file  . Number of children: 3  . Years of education: Not on file  . Highest education level: Not on file  Occupational History  . Not on file  Social Needs  . Financial resource strain: Not on file  . Food insecurity:    Worry: Not on file    Inability: Not on file  . Transportation needs:    Medical: Not on file    Non-medical: Not on file  Tobacco Use  . Smoking status: Current Every Day Smoker    Packs/day: 0.50    Years: 36.00    Pack years: 18.00    Types: Cigarettes  . Smokeless tobacco: Never Used  Substance and Sexual Activity  . Alcohol use: Yes    Alcohol/week: 15.0 standard drinks  Types: 10 Cans of beer, 5 Shots of liquor per week    Comment: weekends  . Drug use: Yes    Types: Cocaine    Comment: reports cocaine usage ~2X/ month; last use 12/26/17  . Sexual activity: Not on file  Lifestyle  . Physical activity:    Days per week: Not on file    Minutes per session: Not on file  . Stress: Not on file  Relationships  . Social connections:    Talks on phone: Not on file    Gets together: Not on file    Attends religious service: Not on file    Active member of club or organization: Not on file    Attends meetings of clubs or organizations: Not on file    Relationship status: Not on file  . Intimate partner violence:    Fear of current or ex partner: Not on file    Emotionally abused: Not on file    Physically abused: Not on file    Forced sexual activity: Not on file  Other Topics Concern  .  Not on file  Social History Narrative  . Not on file     Physical Exam  Vital Signs and Nursing Notes reviewed Vitals:   01/04/18 1932  BP: (!) 157/82  Pulse: (!) 102  Resp: 20  Temp: 98.7 F (37.1 C)  SpO2: 97%    CONSTITUTIONAL: Well-appearing, NAD NEURO:  Alert and oriented x 3, no focal deficits EYES:  eyes equal and reactive ENT/NECK:  no LAD, no JVD CARDIO: Regular rate, well-perfused, normal S1 and S2 PULM:  CTAB no wheezing or rhonchi GI/GU:  normal bowel sounds, non-distended, non-tender MSK/SPINE:  No gross deformities, no edema SKIN:  no rash, atraumatic; well-healing incision to the left groin with glue in place, largely nontender, mild surrounding edema but soft tissue is soft, faint erythema to the incision site PSYCH:  Appropriate speech and behavior  Diagnostic and Interventional Summary    EKG Interpretation  Date/Time:    Ventricular Rate:    PR Interval:    QRS Duration:   QT Interval:    QTC Calculation:   R Axis:     Text Interpretation:        Labs Reviewed  CBC WITH DIFFERENTIAL/PLATELET - Abnormal; Notable for the following components:      Result Value   Hemoglobin 12.7 (*)    All other components within normal limits  BASIC METABOLIC PANEL - Abnormal; Notable for the following components:   Glucose, Bld 148 (*)    All other components within normal limits    No orders to display    Medications - No data to display   Procedures Critical Care  ED Course and Medical Decision Making  I have reviewed the triage vital signs and the nursing notes.  Pertinent labs & imaging results that were available during my care of the patient were reviewed by me and considered in my medical decision making (see below for details). Clinical Course as of Jan 04 2102  Thu Jan 04, 2018  2059 Very mild postop pain and swelling 2 days after biopsy procedure, during which multiple tissue samples were taken.  Vital signs stable, afebrile, no other  symptoms, normal white count, patient appears well, no significant tenderness.  Will consult general surgery just to let him know that there patient is here 2 days after the procedure, but patient has very close follow-up and I anticipate discharge shortly.  No indication for  antibiotics based on my exam.   [MB]    Clinical Course User Index [MB] Maudie Flakes, MD    Discussed case with general surgeon on-call, who, based on this description agrees that discharge with close follow-up is appropriate.  After the discussed management above, the patient was determined to be safe for discharge.  The patient was in agreement with this plan and all questions regarding their care were answered.  ED return precautions were discussed and the patient will return to the ED with any significant worsening of condition.  Barth Kirks. Sedonia Small, MD [place] [email or web address]  Final Clinical Impressions(s) / ED Diagnoses     ICD-10-CM   1. Post-operative pain G89.18   2. Encounter for post surgical wound check Z48.89     ED Discharge Orders    None         Maudie Flakes, MD 01/04/18 2104

## 2018-01-04 NOTE — ED Triage Notes (Signed)
Pt presents to ED for assessment of left groin pain after having a lymph biopsy done on Tuesday.  Patient c/o increasing swelling, some bloody discharge, and increasing pain to area yesterday.

## 2018-01-04 NOTE — Discharge Instructions (Addendum)
You were evaluated in the Emergency Department and after careful evaluation, we did not find any emergent condition requiring admission or further testing in the hospital.  Your symptoms today seem to be due to normal inflammation and healing after a procedure.  Please keep your upcoming appointments and use Tylenol at home for pain.  Please return to the Emergency Department if you experience any worsening of your condition.  We encourage you to follow up with a primary care provider.  Thank you for allowing [place] to be a part of your care.

## 2018-01-05 ENCOUNTER — Encounter (HOSPITAL_COMMUNITY)
Admission: RE | Admit: 2018-01-05 | Discharge: 2018-01-05 | Disposition: A | Discharge status: Home / Self Care | Payer: 59 | Source: Ambulatory Visit | Attending: Oncology | Admitting: Oncology

## 2018-01-05 DIAGNOSIS — C8330 Diffuse large B-cell lymphoma, unspecified site: Secondary | ICD-10-CM | POA: Diagnosis not present

## 2018-01-05 DIAGNOSIS — R599 Enlarged lymph nodes, unspecified: Secondary | ICD-10-CM | POA: Insufficient documentation

## 2018-01-05 LAB — GLUCOSE, CAPILLARY: Glucose-Capillary: 98 mg/dL (ref 70–99)

## 2018-01-05 IMAGING — PT NM PET TUM IMG INITIAL (PI) SKULL BASE T - THIGH
1 of 8 series · 1 of 25 positions shown · non-contrast
Comparison: CT [DATE]

CLINICAL DATA: Initial treatment strategy for B-cell lymphoma.

EXAM:
NUCLEAR MEDICINE PET SKULL BASE TO THIGH
TECHNIQUE: 14.9 mCi F-18 FDG was injected intravenously. Full-ring PET imaging
was performed from the skull base to thigh after the radiotracer. CT
data was obtained and used for attenuation correction and anatomic
localization.
Fasting blood glucose: 98 mg/dl

[Series 4: ct sk_thigh 5.0 hd_fov · axial · 5.0mm · 1.17mm/px · 1 of 245 slices shown]
[im 245/245  brain]
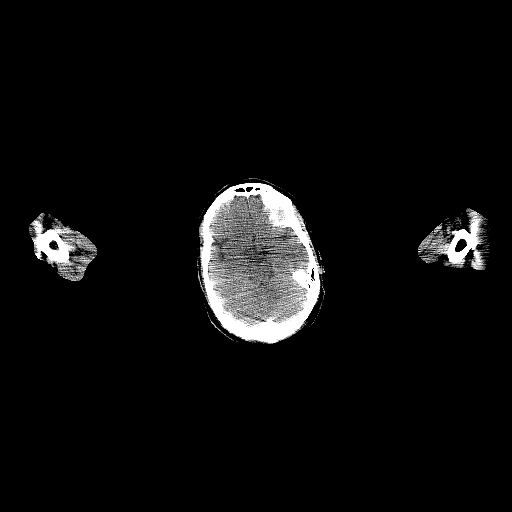

[1 of 25 positions shown; findings below may reference images not displayed]

FINDINGS: Mediastinal blood pool activity: SUV max

NECK: No hypermetabolic lymph nodes in the neck.

Incidental CT findings: none

CHEST: Small hypermetabolic lymph node in the LEFT upper mediastinum
adjacent to the LEFT subclavian artery measures only 6 mm but has
intense metabolic activity SUV max equal 10.9.

No additional mediastinal adenopathy no suspicious pulmonary
nodularity.

Incidental CT findings: none

ABDOMEN/PELVIS: Intense hypermetabolic adenopathy in the porta
hepatis. The nodes are mildly enlarged at 15 mm but with intense
metabolic activity (SUV max equal 27.2).

Hypermetabolic adenopathy in the retroperitoneum LEFT of the aorta
measures 3.5 cm with SUV max equal 36.9. Adenopathy extends along
the LEFT iliac vessels. Massively enlarged LEFT external iliac lymph
node with some central necrosis measures 8.0 x 12.5 cm and with SUV
max equal 36.7.

Enlarged LEFT inguinal lymph node with SUV max equal 38.

No abnormal activity in the liver. The spleen is normal size without
metabolic activity. No abnormal activity in the bowel.

The bladder is displaced rightward by the a LEFT pelvic adenopathy.

Incidental CT findings: Multiple benign-appearing cysts within the
kidneys

SKELETON: There several discrete metabolic foci within the skeleton
without clear CT correlation. For example lesion in the LEFT sacral
ala with SUV max equal 12.3. Lesion in theupper thoracic spine with
SUV max equal 23.9. Lesion inferior body of LEFT scapula with SUV
max equal

Incidental CT findings: none
IMPRESSION: 1. Massively enlarged pelvic lymph nodes intense metabolic activity
consistent lymphoma. Additional
2. Additional hypermetabolic lymph nodes in the porta hepatis and
retroperitoneum LEFT aorta.
3. Solitary hypermetabolic mediastinal lymph node in the upper LEFT
mediastinum.
4. Multiple discrete sites of hypermetabolic skeletal metastasis
(approximately 5 sites).
5. Normal spleen.

## 2018-01-05 MED ORDER — FLUDEOXYGLUCOSE F - 18 (FDG) INJECTION
14.9600 | Freq: Once | INTRAVENOUS | Status: AC | PRN
  Administered 2018-01-05: 14.96 via INTRAVENOUS

## 2018-01-07 ENCOUNTER — Encounter: Payer: Self-pay | Admitting: Physician Assistant

## 2018-01-09 ENCOUNTER — Ambulatory Visit: Payer: 59 | Admitting: Emergency Medicine

## 2018-01-09 IMAGING — US US BIOPSY LYMPH NODE
1 series · 13 of 14 positions shown · non-contrast
Comparison: none

CLINICAL DATA: Enlarged left inguinal lymph node.

[Series 1: us biopsy lymph node · 0.11mm/px · 13 of 14 slices shown]
[im 1/14]
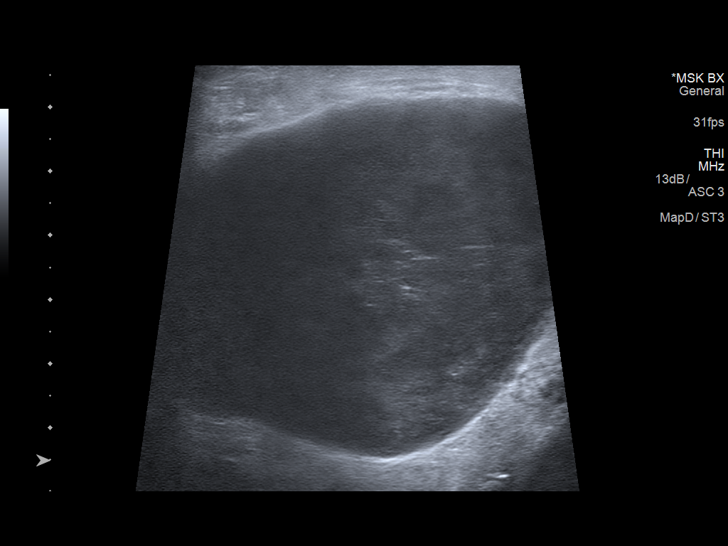
[im 2/14]
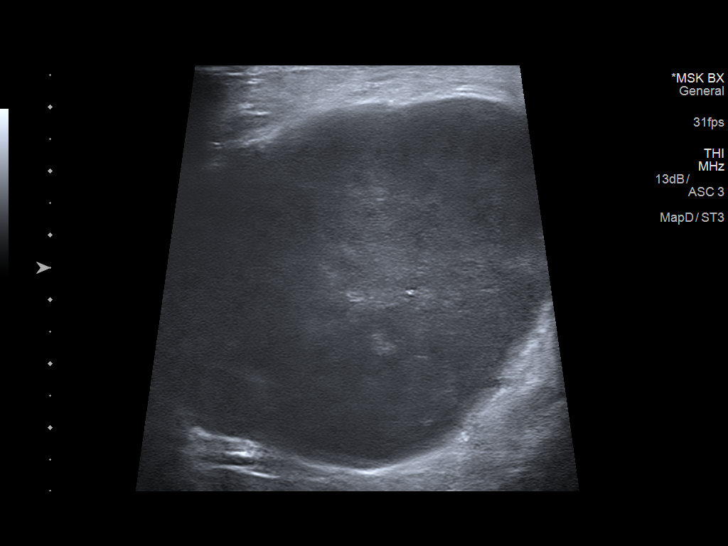
[im 3/14]
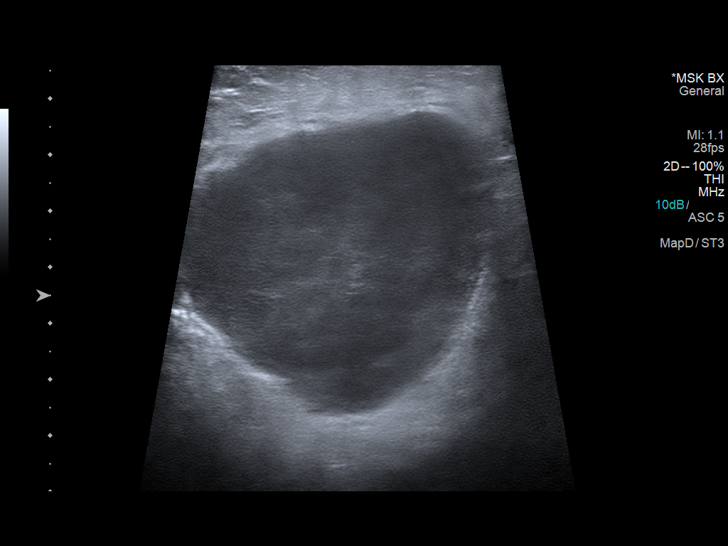
[im 4/14]
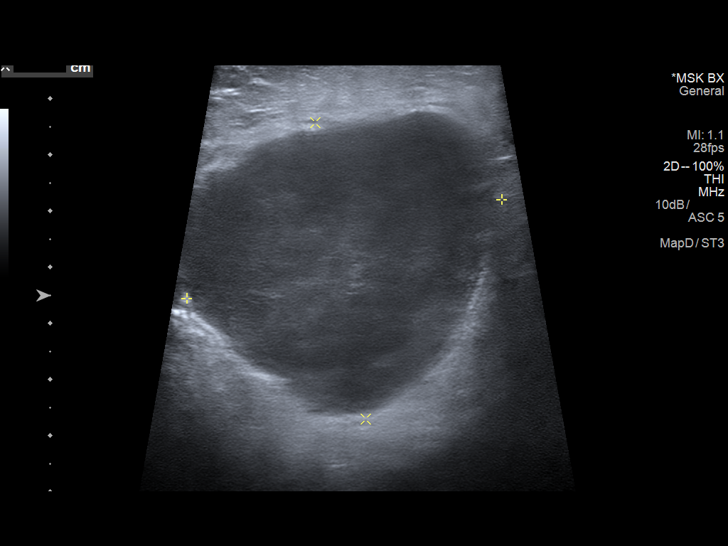
[im 5/14]
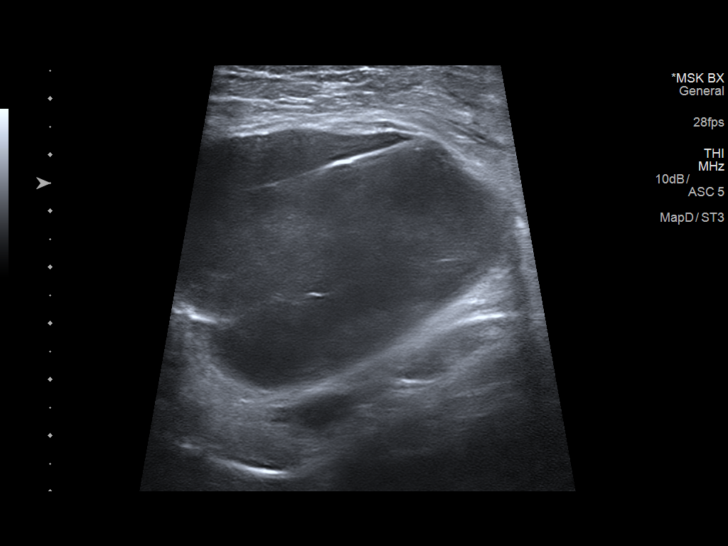
[im 6/14]
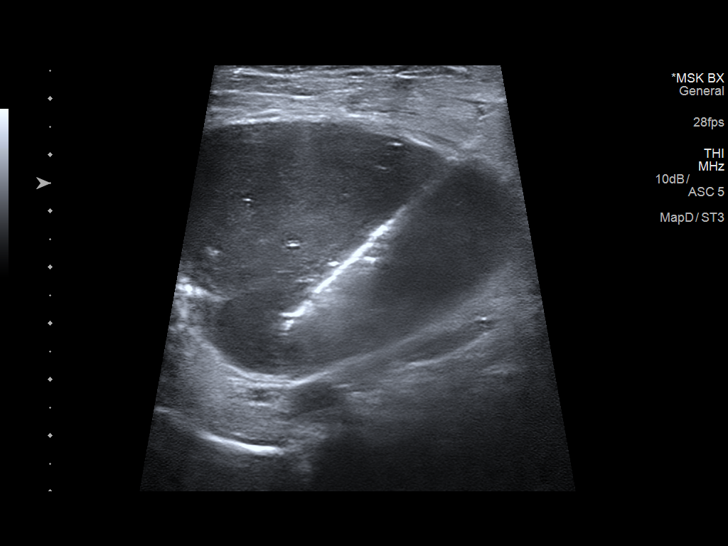
[im 8/14]
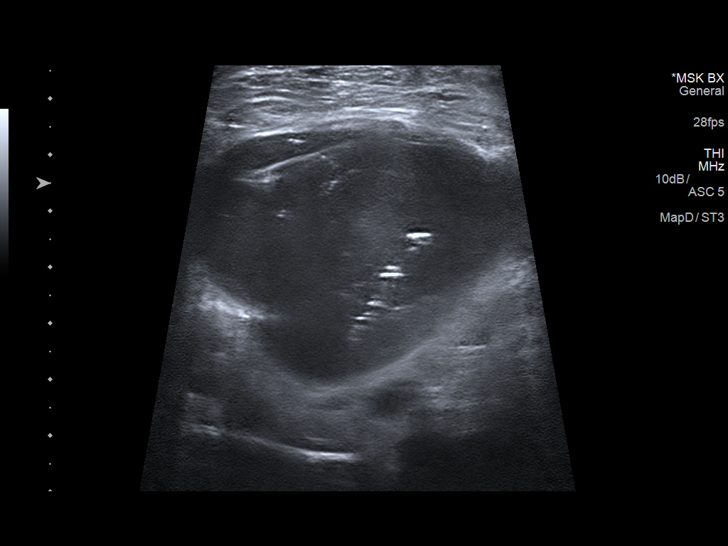
[im 9/14]
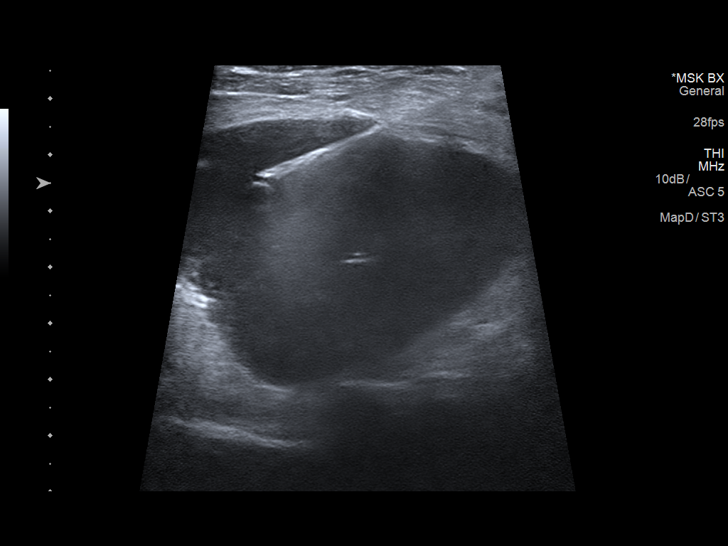
[im 10/14]
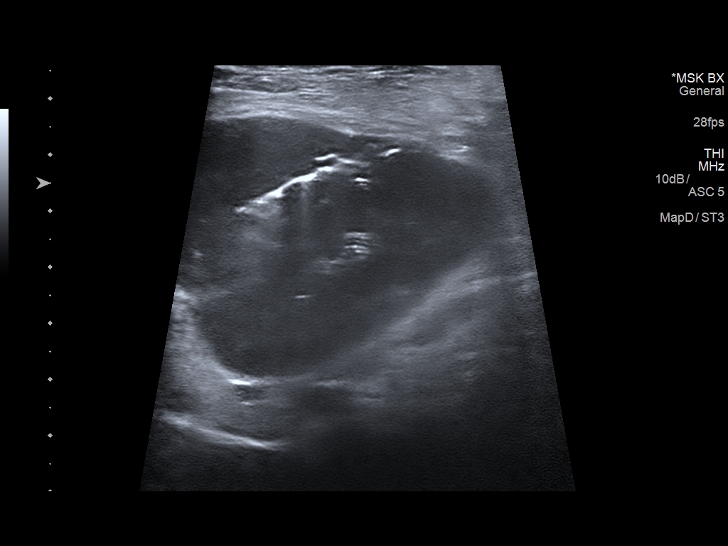
[im 11/14]
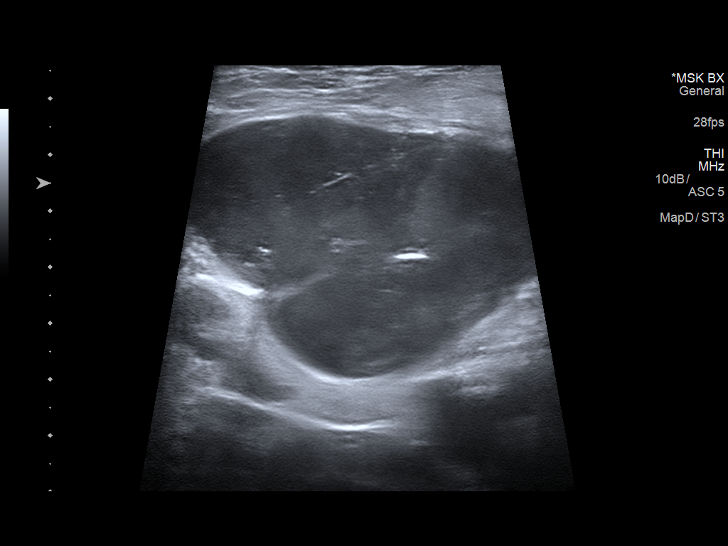
[im 12/14]
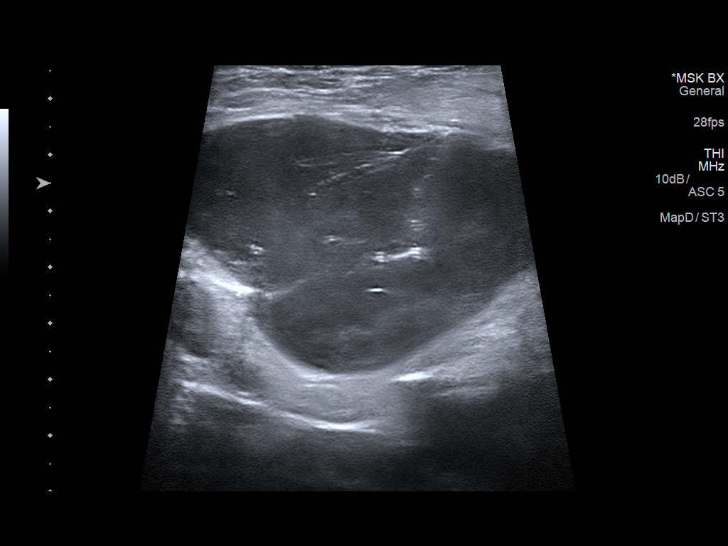
[im 13/14]
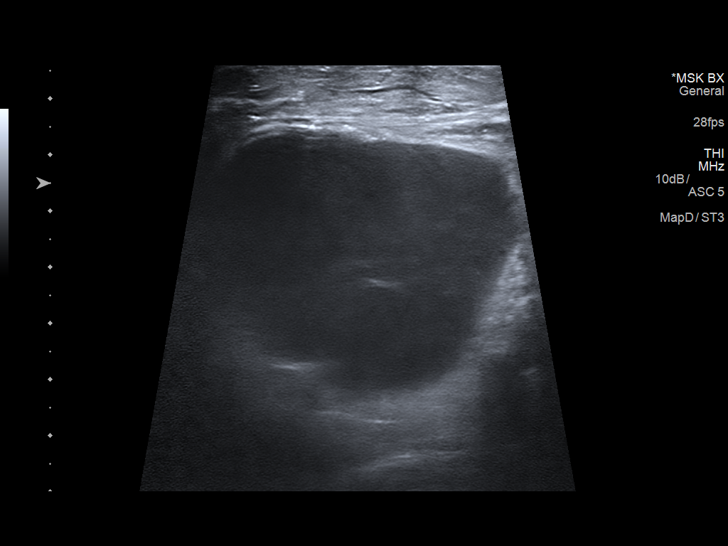
[im 14/14]
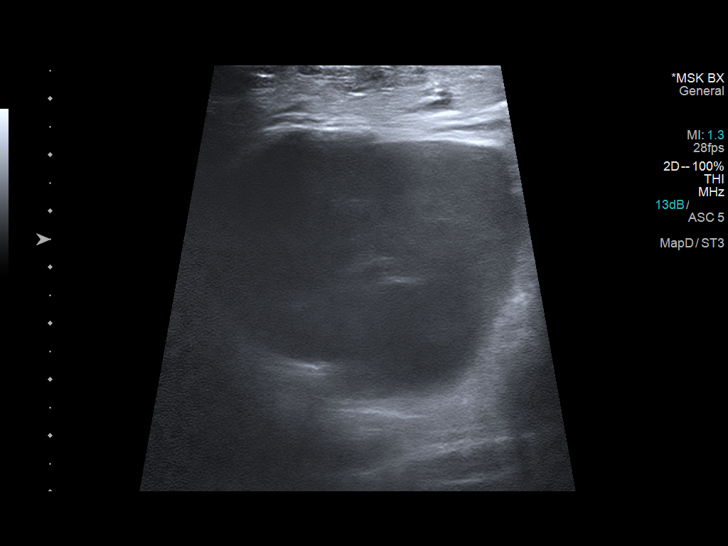

[13 of 14 positions shown; findings below may reference images not displayed]

EXAM:
ULTRASOUND GUIDED CORE BIOPSY OF LEFT INGUINAL LYMPH NODE

MEDICATIONS:
1.0 mg IV Versed; 50 mcg IV Fentanyl

Total Moderate Sedation Time: 13 minutes.

The patient's level of consciousness and physiologic status were
continuously monitored during the procedure by Radiology nursing.

PROCEDURE:
The procedure, risks, benefits, and alternatives were explained to
the patient. Questions regarding the procedure were encouraged and
answered. The patient understands and consents to the procedure. A
time out was performed prior to initiating the procedure.

Ultrasound was performed of the left groin. The left inguinal region
was prepped with chlorhexidine in a sterile fashion, and a sterile
drape was applied covering the operative field. A sterile gown and
sterile gloves were used for the procedure. Local anesthesia was
provided with 1% Lidocaine.

Under ultrasound guidance, a total of 6 separate 16 gauge core
biopsy samples were obtained through different portions of an
enlarged left inguinal lymph node. Core biopsy samples were
submitted in saline. Additional ultrasound was performed after
biopsy.

COMPLICATIONS:
None.
FINDINGS: Large hypoechoic left inguinal lymph node measures approximately 7
cm in maximum diameter by ultrasound. The lymph node was sampled in
several different portions by core biopsy. Solid tissue was
obtained. Post biopsy imaging shows no evidence of hemorrhage.
IMPRESSION: Ultrasound-guided core biopsy performed of enlarged left inguinal
lymph node measuring 7 cm in maximum diameter by ultrasound. Six
total 16 gauge core biopsy samples were obtained and submitted in
saline.

## 2018-01-11 NOTE — Progress Notes (Signed)
[place]   Telephone:([phone]) [phone] Fax:(336) [phone]   Clinic New Consult Note   Date of Service:  01/12/18    Patient Care Team: Shirline Frees, MD as PCP - General (Family Medicine) 01/12/2018  CHIEF COMPLAINTS/PURPOSE OF CONSULTATION:  Likely Lymphoma  HISTORY OF PRESENTING ILLNESS:   Jesus [name] 62 y.o. male is here because of left lower extremity edema and lymphadenopathy.  The patient was seen in the emergency room this past Friday for the same issue.  A CT of the abdomen and pelvis was performed showing bulky left inguinal, left hemipelvic, and retroperitoneal adenopathy.  He was referred to [place] from the emergency room for further evaluation.  Doppler ultrasound of the left lower extremity was performed and was negative for DVT. Patient reports that he has been having left lower extremity edema in his left groin and left leg for approximately 1 month.  He states that the swelling in the left groin started to get better but then worsened.  The left lower extremity edema has slowly worsened over time.  Patient denies having fevers and chills.  He reports that he does have night sweats at times.  He reported having headaches approximate 1 [place]  He thinks his headaches are related to not having his blood pressure medication.  His headaches have now resolved.  He denies visual changes.  The patient denies chest pain, shortness of breath and cough.  No nausea, vomiting, constipation, diarrhea.  Denies abdominal pain.  The patient denies recent weight loss and has actually gained weight recently.  Patient denies epistaxis, bleeding gums, hemoptysis, hematuria, but occasionally, and melena.  He reports increased urinary frequency over the past month but no dysuria.  The patient is here for evaluation and discussion of his recent CT and lab findings.  Interval History:   [name] returns today for management and evaluation of his  newly diagnosed T-Cell/histocyte rich Large B-Cell Lymphoma. The patient's last visit with [place] was on 01/01/18. [name].   The pt reports that he is still having some leg swelling on his left side. He notes that his surgical site from his biopsy is healing. He notes that he has developed no new concerns and has stable weight and energy levels.   Of note since the patient's last visit, pt has had PET/CT completed on 01/05/18 with results revealing Massively enlarged pelvic lymph nodes intense metabolic activity consistent lymphoma. 2. Additional hypermetabolic lymph nodes in the porta hepatis and retroperitoneum LEFT aorta. 3. Solitary hypermetabolic mediastinal lymph node in the upper LEFT Mediastinum. 4. 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 5. Normal spleen.  Lab results (01/12/18) of CBC w/diff, BMP, and Reticulocytes is as follows: all values are WNL except for HGB at 12.7, Glucose at 148. 01/12/18 Hep C is pending  On review of systems, pt reports stable left leg swelling, stable energy levels, stable weight, eating well, and denies any new symptoms, CP, SOB, abdominal pain, and any other symptoms.    MEDICAL HISTORY:  Past Medical History:  Diagnosis Date  . Allergy   . History of kidney stones   . Hyperlipidemia   . Hypertension   . Lymphadenopathy   . Pain, lower leg    Bilateral  . Peripheral arterial disease ([place])   . Pre-diabetes   . Red-green color blindness   . Snores   . Wears glasses  SURGICAL HISTORY: Past Surgical History:  Procedure Laterality Date  . CATARACT EXTRACTION W/ INTRAOCULAR LENS  IMPLANT, BILATERAL    . COLONOSCOPY    . dislodged salava stone    . FRACTURE SURGERY    . HAND ARTHROPLASTY  1995   crushed left hand  . INGUINAL LYMPH NODE BIOPSY Left 01/02/2018   Procedure: LEFT INGUINAL LYMPH NODE BIOPSY;  Surgeon: Rolm Bookbinder, MD;  Location: [facility];  Service:  General;  Laterality: Left;  Marland Kitchen MICROLARYNGOSCOPY Left 01/17/2014   Procedure: MICROLARYNGOSCOPY WITH EXCISION OF THE BIOPSY OF LEFT VOCAL CORD LESION;  Surgeon: Izora Gala, MD;  Location: [facility];  Service: ENT;  Laterality: Left;  . ORIF FOOT FRACTURE  2005   left    SOCIAL HISTORY: Social History   Socioeconomic History  . Marital status: Divorced    Spouse name: Not on file  . Number of children: 3  . Years of education: Not on file  . Highest education level: Not on file  Occupational History  . Not on file  Social Needs  . Financial resource strain: Not on file  . Food insecurity:    Worry: Not on file    Inability: Not on file  . Transportation needs:    Medical: Not on file    Non-medical: Not on file  Tobacco Use  . Smoking status: Current Every Day Smoker    Packs/day: 0.50    Years: 36.00    Pack years: 18.00    Types: Cigarettes  . Smokeless tobacco: Never Used  Substance and Sexual Activity  . Alcohol use: Yes    Alcohol/week: 15.0 standard drinks    Types: 10 Cans of beer, 5 Shots of liquor per week    Comment: weekends  . Drug use: Yes    Types: Cocaine    Comment: reports cocaine usage ~2X/ month; last use 12/26/17  . Sexual activity: Not on file  Lifestyle  . Physical activity:    Days per week: Not on file    Minutes per session: Not on file  . Stress: Not on file  Relationships  . Social connections:    Talks on phone: Not on file    Gets together: Not on file    Attends religious service: Not on file    Active member of club or organization: Not on file    Attends meetings of clubs or organizations: Not on file    Relationship status: Not on file  . Intimate partner violence:    Fear of current or ex partner: Not on file    Emotionally abused: Not on file    Physically abused: Not on file    Forced sexual activity: Not on file  Other Topics Concern  . Not on file  Social History Narrative  . Not on file    FAMILY  HISTORY: Family History  Problem Relation Age of Onset  . Breast cancer Mother   . Diabetes Father   . Hypertension Father   . Stroke Father   . Mental illness Sister   . Hypertension Daughter   . Mental illness Daughter   . Hypertension Brother   . Colon cancer Brother   . Breast cancer Sister     ALLERGIES:  is allergic to bee venom.  MEDICATIONS:  Current Outpatient Medications  Medication Sig Dispense Refill  . amLODipine (NORVASC) 5 MG tablet Take 5 mg by mouth daily.     Marland Kitchen aspirin 81 MG tablet Take 81  mg by mouth daily.    Marland Kitchen CIALIS 20 MG tablet Take 20 mg by mouth daily as needed for erectile dysfunction.   0  . lisinopril (PRINIVIL,ZESTRIL) 20 MG tablet Take 20 mg by mouth daily.    . Multiple Vitamins-Minerals (MULTIVITAMIN WITH MINERALS) tablet Take 1 tablet by mouth daily.    . naproxen (NAPROSYN) 500 MG tablet Take 500 mg by mouth 2 (two) times daily as needed for moderate pain.     . traMADol (ULTRAM) 50 MG tablet Take 2 tablets (100 mg total) by mouth every 6 (six) hours as needed. 10 tablet 0   No current facility-administered medications for this visit.     REVIEW OF SYSTEMS:   A 10+ POINT REVIEW OF SYSTEMS WAS OBTAINED including neurology, dermatology, psychiatry, cardiac, respiratory, lymph, extremities, GI, GU, Musculoskeletal, constitutional, breasts, reproductive, HEENT.  All pertinent positives are noted in the HPI.  All others are negative.   PHYSICAL EXAMINATION: ECOG PERFORMANCE STATUS: 1 - Symptomatic but completely ambulatory  Vitals:   01/12/18 1140  BP: 130/76  Pulse: 92  Resp: 18  Temp: 98.2 F (36.8 C)  SpO2: 98%   Filed Weights   01/12/18 1140  Weight: 295 lb 4.8 oz (133.9 kg)    GENERAL:alert, in no acute distress and comfortable SKIN: no acute rashes, no significant lesions EYES: conjunctiva are pink and non-injected, sclera anicteric OROPHARYNX: MMM, no exudates, no oropharyngeal erythema or ulceration NECK: supple, no  JVD LYMPH: Left inguinal LN palpable measures approximately 6cm,  no palpable lymphadenopathy in the cervical or axillary regions LUNGS: clear to auscultation b/l with normal respiratory effort HEART: regular rate & rhythm ABDOMEN:  normoactive bowel sounds , non tender, not distended. No palpable hepatosplenomegaly.  Extremity: LLE 2+ pedal edema, no right pedal edema PSYCH: alert & oriented x 3 with fluent speech NEURO: no focal motor/sensory deficits   LABORATORY DATA:  I have reviewed the data as listed CBC Latest Ref Rng & Units 01/04/2018 12/26/2017 12/22/2017  WBC 4.0 - 10.5 K/uL 5.4 4.4 5.6  Hemoglobin 13.0 - 17.0 g/dL 12.7(L) 13.0 12.6(L)  Hematocrit 39.0 - 52.0 % 40.2 39.8 38.6(L)  Platelets 150 - 400 K/uL 236 313 366    CMP Latest Ref Rng & Units 01/04/2018 12/22/2017 12/22/2017  Glucose 70 - 99 mg/dL 148(H) 106(H) -  BUN 8 - 23 mg/dL 13 19 -  Creatinine 0.61 - 1.24 mg/dL 1.00 1.04 1.00  Sodium 135 - 145 mmol/L 139 144 -  Potassium 3.5 - 5.1 mmol/L 3.5 4.0 -  Chloride 98 - 111 mmol/L 104 108 -  CO2 22 - 32 mmol/L 26 27 -  Calcium 8.9 - 10.3 mg/dL 9.9 9.6 -  Total Protein 6.5 - 8.1 g/dL - 7.0 -  Total Bilirubin 0.3 - 1.2 mg/dL - 0.6 -  Alkaline Phos 38 - 126 U/L - 51 -  AST 15 - 41 U/L - 23 -  ALT 0 - 44 U/L - 15 -   827/19 Left Inguinal LN Bx:   12/26/17 Tissue Flow Cytometry:   12/26/17 Inguinal Core biopsy:    RADIOGRAPHIC STUDIES: I have personally reviewed the radiological images as listed and agreed with the findings in the report. Ct Abdomen Pelvis W Contrast  Result Date: 12/22/2017 CLINICAL DATA:  Patient with lower abdominal and groin pain. EXAM: CT ABDOMEN AND PELVIS WITH CONTRAST TECHNIQUE: Multidetector CT imaging of the abdomen and pelvis was performed using the standard protocol following bolus administration of intravenous contrast. CONTRAST:  160m ISOVUE-300 IOPAMIDOL (ISOVUE-300) INJECTION 61% COMPARISON:  CT abdomen pelvis 02/12/2010. FINDINGS:  Lower chest: Normal heart size. Dependent atelectasis bilateral lower lobes. No pleural effusion. Hepatobiliary: Liver is normal in size and contour. No focal hepatic lesions identified. Gallbladder is decompressed. No intrahepatic or extrahepatic biliary ductal dilatation. Pancreas: Unremarkable Spleen: Unremarkable Adrenals/Urinary Tract: Adrenal glands are normal. Multiple bilateral renal cyst. No hydronephrosis. Urinary bladder is decompressed. Stomach/Bowel: Sigmoid colonic diverticulosis. No CT evidence for acute diverticulitis. Normal appendix. No evidence for small bowel obstruction. Small hiatal hernia. Normal morphology of the stomach. Vascular/Lymphatic: Normal caliber abdominal aorta. Peripheral calcified atherosclerotic plaque. Multiple enlarged retroperitoneal, pelvic and left inguinal lymph nodes are demonstrated. Reference 4.2 cm left periaortic lymph node (image 55; series 2). Reference 13.4 x 6.8 cm left pelvic lymph node (image 74; series 2). Reference 5.2 x 6.1 cm left inguinal lymph node (image 86; series 2). Reproductive: Heterogeneous prostate. Other: Small fat containing right inguinal hernia. Musculoskeletal: Lumbar spine degenerative changes. Heterogeneous attenuation of the ileum bilaterally, unchanged from prior. IMPRESSION: 1. Bulky left inguinal, left hemi pelvic and retroperitoneal adenopathy. Considerations include metastatic disease or lymphoma. Electronically Signed   By: DLovey NewcomerM.D.   On: 12/22/2017 22:00   Nm Pet Image Initial (pi) Skull Base To Thigh  Result Date: 01/07/2018 CLINICAL DATA:  Initial treatment strategy for B-cell lymphoma. EXAM: NUCLEAR MEDICINE PET SKULL BASE TO THIGH TECHNIQUE: 14.9 mCi F-18 FDG was injected intravenously. Full-ring PET imaging was performed from the skull base to thigh after the radiotracer. CT data was obtained and used for attenuation correction and anatomic localization. Fasting blood glucose: 98 mg/dl COMPARISON:  CT 12/22/2017  FINDINGS: Mediastinal blood pool activity: SUV max 2.17 NECK: No hypermetabolic lymph nodes in the neck. Incidental CT findings: none CHEST: Small hypermetabolic lymph node in the LEFT upper mediastinum adjacent to the LEFT subclavian artery measures only 6 mm but has intense metabolic activity SUV max equal 10.9. No additional mediastinal adenopathy no suspicious pulmonary nodularity. Incidental CT findings: none ABDOMEN/PELVIS: Intense hypermetabolic adenopathy in the porta hepatis. The nodes are mildly enlarged at 15 mm but with intense metabolic activity (SUV max equal 27.2). Hypermetabolic adenopathy in the retroperitoneum LEFT of the aorta measures 3.5 cm with SUV max equal 36.9. Adenopathy extends along the LEFT iliac vessels. Massively enlarged LEFT external iliac lymph node with some central necrosis measures 8.0 x 12.5 cm and with SUV max equal 36.7. Enlarged LEFT inguinal lymph node with SUV max equal 38. No abnormal activity in the liver. The spleen is normal size without metabolic activity. No abnormal activity in the bowel. The bladder is displaced rightward by the a LEFT pelvic adenopathy. Incidental CT findings: Multiple benign-appearing cysts within the kidneys SKELETON: There several discrete metabolic foci within the skeleton without clear CT correlation. For example lesion in the LEFT sacral ala with SUV max equal 12.3. Lesion in theupper thoracic spine with SUV max equal 23.9. Lesion inferior body of LEFT scapula with SUV max equal 8.5 Incidental CT findings: none IMPRESSION: 1. Massively enlarged pelvic lymph nodes intense metabolic activity consistent lymphoma. Additional 2. Additional hypermetabolic lymph nodes in the porta hepatis and retroperitoneum LEFT aorta. 3. Solitary hypermetabolic mediastinal lymph node in the upper LEFT mediastinum. 4. 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 5. Normal spleen. Electronically Signed   By: SSuzy Bouchard M.D.   On: 01/07/2018 10:55   Dg Abd 2 Views  Result Date: 12/22/2017 CLINICAL DATA:  Left groin swelling. EXAM:  ABDOMEN - 2 VIEW COMPARISON:  CT scan of February 12, 2010. FINDINGS: The bowel gas pattern is normal. There is no evidence of free air. No radio-opaque calculi or other significant radiographic abnormality is seen. IMPRESSION: No evidence of bowel obstruction or ileus. Electronically Signed   By: Marijo Conception, M.D.   On: 12/22/2017 15:16   [place] Core Biopsy (lymph Nodes)  Result Date: 12/26/2017 CLINICAL DATA:  Enlarged left inguinal lymph node. EXAM: ULTRASOUND GUIDED CORE BIOPSY OF LEFT INGUINAL LYMPH NODE MEDICATIONS: 1.0 mg IV Versed; 50 mcg IV Fentanyl Total Moderate Sedation Time: 13 minutes. The patient's level of consciousness and physiologic status were continuously monitored during the procedure by Radiology nursing. PROCEDURE: The procedure, risks, benefits, and alternatives were explained to the patient. Questions regarding the procedure were encouraged and answered. The patient understands and consents to the procedure. A time out was performed prior to initiating the procedure. Ultrasound was performed of the left groin. The left inguinal region was prepped with chlorhexidine in a sterile fashion, and a sterile drape was applied covering the operative field. A sterile gown and sterile gloves were used for the procedure. Local anesthesia was provided with 1% Lidocaine. Under ultrasound guidance, a total of 6 separate 16 gauge core biopsy samples were obtained through different portions of an enlarged left inguinal lymph node. Core biopsy samples were submitted in saline. Additional ultrasound was performed after biopsy. COMPLICATIONS: None. FINDINGS: Large hypoechoic left inguinal lymph node measures approximately 7 cm in maximum diameter by ultrasound. The lymph node was sampled in several different portions by core biopsy. Solid tissue was obtained. Post biopsy imaging shows no  evidence of hemorrhage. IMPRESSION: Ultrasound-guided core biopsy performed of enlarged left inguinal lymph node measuring 7 cm in maximum diameter by ultrasound. Six total 16 gauge core biopsy samples were obtained and submitted in saline. Electronically Signed   By: Aletta Edouard M.D.   On: 12/26/2017 17:06    ASSESSMENT & PLAN:   This is a pleasant 62 y.o. 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 Additional 2. Additional hypermetabolic lymph nodes in the porta hepatis and retroperitoneum LEFT aorta. 3. Solitary hypermetabolic mediastinal lymph node in the upper LEFT mediastinum. 4. 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 5. Normal spleen.  HIV non reactive on 12/22/2017 Component     Latest Ref Rng & Units 01/01/2018  Hepatitis B Surface Ag     Negative Negative  HCV Ab     0.0 - 0.9 s/co ratio <0.1  Hep A Ab, IgM     Negative Negative  Hep B Core Ab, IgM     Negative Negative   2) left lower extremity swelling  Doppler ultrasound for DVT was negative in the left lower extremity.   Likely from venous compression +/- lymphatic obstruction from bulky left inguinal, left hemipelvic, and  retroperitoneal adenopathy.   PLAN: -Discussed pt labwork from 01/04/18; stable blood counts and chemistries -Discussed the 01/02/18 Left inguinal LN Biopsy which revealed T-Cell/histocyte rich Large B-Cell Lymphoma -Discussed the 01/05/18 PET/CT which revealed Massively enlarged pelvic lymph nodes intense metabolic activity consistent lymphoma. 2. Additional hypermetabolic lymph nodes in the porta hepatis and retroperitoneum LEFT aorta. 3. Solitary hypermetabolic mediastinal lymph node in the upper LEFT Mediastinum. 4. Multiple discrete sites of  hypermetabolic skeletal  metastasis (approximately 5 sites). 5. Normal spleen.  -Discussed the diagnosis, natural history, indication for treatment, and intent for cure with treatment if tolerated for his newly diagnosed aggressive large B-cell lymphoma. -Discussed that the pt is Stage IV -Discussed the treatment options of 6 cycles of either outpatient based R-CHOP or inpatient based EPOCH-R, the latter being my recommendation given the aggressiveness of his disease -Discussed the 12/27/17 ECHO which revealed LV EF of 55-60% -Will set the pt up for chemotherapy counseling -Will refer the pt to our social worker -Will start the pt on Dexamethasone and Allopurinol for TLS prophylaxis -Will hope to start EPOCH-R next week, or as soon as possible -Will refer pt to IR for port placement -Begin taking 32m aspirin every day DVT prophylaxis given his some element of venous obstruction.  Orders Placed This Encounter  Procedures  . IR IMAGING GUIDED PORT INSERTION    Standing Status:   Future    Standing Expiration Date:   03/15/2019    Order Specific Question:   Reason for Exam (SYMPTOM  OR DIAGNOSIS REQUIRED)    Answer:   needs urgent port a cath to start chemotherapy (infusional) EPOCH-R for T cell rich B cell lymphoma    Order Specific Question:   Preferred Imaging Location?    [place]   [facility] as [facility] -RTC with Dr KIrene Limboin 2 weeks with labs    All questions were answered. The patient knows to call the clinic with any problems, questions or concerns.  The total time spent in the appt was 40 minutes and more than 50% was on counseling and direct patient cares and co-ordination for chemotherapy.    GSullivan LoneMD MS AAHIVMS SShadelands Advanced Endoscopy Institute IncCGrants Pass Surgery CenterHematology/Oncology Physician [facility] (Office):       [phone](Work cell):  [phone](Fax):            [phone] I, SBaldwin Jamaica am acting as a scribe for Dr. KIrene Limbo .I have reviewed the above documentation for accuracy and completeness, and I agree with the above. .Brunetta GeneraMD

## 2018-01-12 ENCOUNTER — Encounter: Payer: Self-pay | Admitting: Hematology

## 2018-01-12 ENCOUNTER — Inpatient Hospital Stay: Payer: 59 | Attending: Oncology

## 2018-01-12 ENCOUNTER — Inpatient Hospital Stay (HOSPITAL_BASED_OUTPATIENT_CLINIC_OR_DEPARTMENT_OTHER): Payer: 59 | Admitting: Hematology

## 2018-01-12 ENCOUNTER — Telehealth: Payer: Self-pay

## 2018-01-12 VITALS — BP 130/76 | HR 92 | Temp 98.2°F | Resp 18 | Ht 73.0 in | Wt 295.3 lb

## 2018-01-12 DIAGNOSIS — G629 Polyneuropathy, unspecified: Secondary | ICD-10-CM

## 2018-01-12 DIAGNOSIS — K4090 Unilateral inguinal hernia, without obstruction or gangrene, not specified as recurrent: Secondary | ICD-10-CM

## 2018-01-12 DIAGNOSIS — F17210 Nicotine dependence, cigarettes, uncomplicated: Secondary | ICD-10-CM

## 2018-01-12 DIAGNOSIS — E785 Hyperlipidemia, unspecified: Secondary | ICD-10-CM

## 2018-01-12 DIAGNOSIS — R600 Localized edema: Secondary | ICD-10-CM | POA: Diagnosis not present

## 2018-01-12 DIAGNOSIS — Z87442 Personal history of urinary calculi: Secondary | ICD-10-CM

## 2018-01-12 DIAGNOSIS — Z7901 Long term (current) use of anticoagulants: Secondary | ICD-10-CM

## 2018-01-12 DIAGNOSIS — Z79899 Other long term (current) drug therapy: Secondary | ICD-10-CM

## 2018-01-12 DIAGNOSIS — I739 Peripheral vascular disease, unspecified: Secondary | ICD-10-CM

## 2018-01-12 DIAGNOSIS — C8338 Diffuse large B-cell lymphoma, lymph nodes of multiple sites: Secondary | ICD-10-CM | POA: Diagnosis not present

## 2018-01-12 DIAGNOSIS — R51 Headache: Secondary | ICD-10-CM | POA: Diagnosis not present

## 2018-01-12 DIAGNOSIS — I10 Essential (primary) hypertension: Secondary | ICD-10-CM

## 2018-01-12 DIAGNOSIS — R599 Enlarged lymph nodes, unspecified: Secondary | ICD-10-CM

## 2018-01-12 DIAGNOSIS — K649 Unspecified hemorrhoids: Secondary | ICD-10-CM

## 2018-01-12 DIAGNOSIS — Z7689 Persons encountering health services in other specified circumstances: Secondary | ICD-10-CM

## 2018-01-12 DIAGNOSIS — Z7189 Other specified counseling: Secondary | ICD-10-CM | POA: Insufficient documentation

## 2018-01-12 DIAGNOSIS — R7303 Prediabetes: Secondary | ICD-10-CM

## 2018-01-12 DIAGNOSIS — Z7982 Long term (current) use of aspirin: Secondary | ICD-10-CM

## 2018-01-12 MED ORDER — ONDANSETRON HCL 8 MG PO TABS: 8.0000 mg | ORAL_TABLET | Freq: Three times a day (TID) | ORAL | 0 refills | Status: DC | PRN

## 2018-01-12 MED ORDER — ALLOPURINOL 100 MG PO TABS: 100.0000 mg | ORAL_TABLET | Freq: Two times a day (BID) | ORAL | 0 refills | Status: DC

## 2018-01-12 MED ORDER — DEXAMETHASONE 4 MG PO TABS: 12.0000 mg | ORAL_TABLET | Freq: Every day | ORAL | 0 refills | Status: DC

## 2018-01-12 NOTE — Progress Notes (Signed)
START OFF PATHWAY REGIMEN - Lymphoma and CLL   [id number]:DA-EPOCH-R q21 Days:   A cycle is every 21 days:     Prednisone      Rituximab      Etoposide      Doxorubicin      Vincristine      Cyclophosphamide      Filgrastim-xxxx   **Always confirm dose/schedule in your pharmacy ordering system**  Patient Characteristics: Diffuse Large B-Cell Lymphoma, First Line, Stage III and IV Disease Type: Not Applicable Disease Type: Diffuse Large B-Cell Lymphoma Disease Type: Not Applicable Line of therapy: First Line [place] Stage: IV Intent of Therapy: Curative Intent, Discussed with Patient

## 2018-01-12 NOTE — Telephone Encounter (Signed)
Patient for inpatient admission on Monday 01/15/18 for EPOCH-R per Dr. Irene Limbo. Spoke to Robin in bed placement to make bed request. Also emailed inpatient chemo admission group to notify of admission. Patient aware of upcoming admission.

## 2018-01-13 LAB — HEPATITIS C ANTIBODY: HCV Ab: <0.1 s/co ratio (ref 0.0–0.9)

## 2018-01-15 ENCOUNTER — Other Ambulatory Visit: Payer: Self-pay

## 2018-01-15 ENCOUNTER — Inpatient Hospital Stay (HOSPITAL_COMMUNITY)
Admission: AD | Admit: 2018-01-15 | Discharge: 2018-01-19 | DRG: 829 | Disposition: A | Discharge status: Home / Self Care | Payer: 59 | Attending: Hematology | Admitting: Hematology

## 2018-01-15 ENCOUNTER — Inpatient Hospital Stay: Payer: 59

## 2018-01-15 ENCOUNTER — Inpatient Hospital Stay (HOSPITAL_COMMUNITY): Payer: 59

## 2018-01-15 ENCOUNTER — Encounter (HOSPITAL_COMMUNITY): Payer: Self-pay

## 2018-01-15 DIAGNOSIS — C8333 Diffuse large B-cell lymphoma, intra-abdominal lymph nodes: Secondary | ICD-10-CM | POA: Diagnosis present

## 2018-01-15 DIAGNOSIS — Z79899 Other long term (current) drug therapy: Secondary | ICD-10-CM | POA: Diagnosis not present

## 2018-01-15 DIAGNOSIS — K649 Unspecified hemorrhoids: Secondary | ICD-10-CM | POA: Diagnosis present

## 2018-01-15 DIAGNOSIS — I739 Peripheral vascular disease, unspecified: Secondary | ICD-10-CM | POA: Diagnosis present

## 2018-01-15 DIAGNOSIS — Z9842 Cataract extraction status, left eye: Secondary | ICD-10-CM | POA: Diagnosis not present

## 2018-01-15 DIAGNOSIS — C8330 Diffuse large B-cell lymphoma, unspecified site: Secondary | ICD-10-CM | POA: Diagnosis not present

## 2018-01-15 DIAGNOSIS — I10 Essential (primary) hypertension: Secondary | ICD-10-CM | POA: Diagnosis present

## 2018-01-15 DIAGNOSIS — E785 Hyperlipidemia, unspecified: Secondary | ICD-10-CM | POA: Diagnosis present

## 2018-01-15 DIAGNOSIS — C8338 Diffuse large B-cell lymphoma, lymph nodes of multiple sites: Secondary | ICD-10-CM | POA: Diagnosis not present

## 2018-01-15 DIAGNOSIS — Z961 Presence of intraocular lens: Secondary | ICD-10-CM | POA: Diagnosis present

## 2018-01-15 DIAGNOSIS — Z9841 Cataract extraction status, right eye: Secondary | ICD-10-CM

## 2018-01-15 DIAGNOSIS — Z8249 Family history of ischemic heart disease and other diseases of the circulatory system: Secondary | ICD-10-CM

## 2018-01-15 DIAGNOSIS — Z7982 Long term (current) use of aspirin: Secondary | ICD-10-CM

## 2018-01-15 DIAGNOSIS — Z9189 Other specified personal risk factors, not elsewhere classified: Secondary | ICD-10-CM | POA: Diagnosis not present

## 2018-01-15 DIAGNOSIS — Z7952 Long term (current) use of systemic steroids: Secondary | ICD-10-CM

## 2018-01-15 DIAGNOSIS — Z7189 Other specified counseling: Secondary | ICD-10-CM

## 2018-01-15 DIAGNOSIS — R7303 Prediabetes: Secondary | ICD-10-CM | POA: Diagnosis present

## 2018-01-15 DIAGNOSIS — C8335 Diffuse large B-cell lymphoma, lymph nodes of inguinal region and lower limb: Secondary | ICD-10-CM | POA: Diagnosis present

## 2018-01-15 DIAGNOSIS — Z800 Family history of malignant neoplasm of digestive organs: Secondary | ICD-10-CM

## 2018-01-15 DIAGNOSIS — M7989 Other specified soft tissue disorders: Secondary | ICD-10-CM

## 2018-01-15 DIAGNOSIS — Z803 Family history of malignant neoplasm of breast: Secondary | ICD-10-CM

## 2018-01-15 DIAGNOSIS — Z5111 Encounter for antineoplastic chemotherapy: Secondary | ICD-10-CM | POA: Diagnosis present

## 2018-01-15 DIAGNOSIS — F17210 Nicotine dependence, cigarettes, uncomplicated: Secondary | ICD-10-CM | POA: Diagnosis present

## 2018-01-15 DIAGNOSIS — F1490 Cocaine use, unspecified, uncomplicated: Secondary | ICD-10-CM | POA: Diagnosis present

## 2018-01-15 DIAGNOSIS — I871 Compression of vein: Secondary | ICD-10-CM | POA: Diagnosis present

## 2018-01-15 HISTORY — PX: IR IMAGING GUIDED PORT INSERTION: IMG5740

## 2018-01-15 LAB — COMPREHENSIVE METABOLIC PANEL
Albumin: 4.3 g/dL (ref 3.5–5.0)
Alkaline Phosphatase: 61 U/L (ref 38–126)
Anion gap: 10 (ref 5–15)
BUN: 18 mg/dL (ref 8–23)
CO2: 26 mmol/L (ref 22–32)
Calcium: 10.1 mg/dL (ref 8.9–10.3)
Glucose, Bld: 157 mg/dL — ABNORMAL HIGH (ref 70–99)
Sodium: 144 mmol/L (ref 135–145)
Total Bilirubin: 0.8 mg/dL (ref 0.3–1.2)
Total Protein: 7.3 g/dL (ref 6.5–8.1)

## 2018-01-15 LAB — CBC WITH DIFFERENTIAL/PLATELET
Basophils Absolute: 0 10*3/uL (ref 0.0–0.1)
Basophils Relative: 0 %
Eosinophils Absolute: 0 10*3/uL (ref 0.0–0.7)
Eosinophils Relative: 0 %
HCT: 39.3 % (ref 39.0–52.0)
Hemoglobin: 12.8 g/dL — ABNORMAL LOW (ref 13.0–17.0)
Lymphocytes Relative: 15 %
Lymphs Abs: 1.1 10*3/uL (ref 0.7–4.0)
MCH: 29.6 pg (ref 26.0–34.0)
MCHC: 32.6 g/dL (ref 30.0–36.0)
MCV: 91 fL (ref 78.0–100.0)
Monocytes Absolute: 0.3 10*3/uL (ref 0.1–1.0)
Monocytes Relative: 4 %
Neutro Abs: 6.3 10*3/uL (ref 1.7–7.7)
Neutrophils Relative %: 81 %
Platelets: 305 10*3/uL (ref 150–400)
RBC: 4.32 MIL/uL (ref 4.22–5.81)
RDW: 13.7 % (ref 11.5–15.5)
WBC: 7.8 10*3/uL (ref 4.0–10.5)

## 2018-01-15 LAB — COMPREHENSIVE METABOLIC PANEL WITH GFR
ALT: 18 U/L (ref 0–44)
AST: 21 U/L (ref 15–41)
Chloride: 108 mmol/L (ref 98–111)
Creatinine, Ser: 1.07 mg/dL (ref 0.61–1.24)
GFR calc Af Amer: >60 mL/min (ref >60)
GFR calc non Af Amer: >60 mL/min (ref >60)
Potassium: 4.3 mmol/L (ref 3.5–5.1)

## 2018-01-15 LAB — PROTIME-INR
INR: 0.96
Prothrombin Time: 12.7 s (ref 11.4–15.2)

## 2018-01-15 LAB — LACTATE DEHYDROGENASE: LDH: 170 U/L (ref 98–192)

## 2018-01-15 LAB — MAGNESIUM: Magnesium: 2 mg/dL (ref 1.7–2.4)

## 2018-01-15 LAB — PHOSPHORUS: Phosphorus: 3.7 mg/dL (ref 2.5–4.6)

## 2018-01-15 LAB — URIC ACID: Uric Acid, Serum: 6.3 mg/dL (ref 3.7–8.6)

## 2018-01-15 IMAGING — US IR FLUORO GUIDE CV LINE*L*
1 series · 4 of 4 positions shown · non-contrast
Comparison: none

INDICATION: 61-year-old male with diffuse large B-cell lymphoma in need of
durable venous access to begin chemotherapy.

[Series 1: ir fluoro/shunt/fist · 4 of 4 slices shown]
[im 1/4]
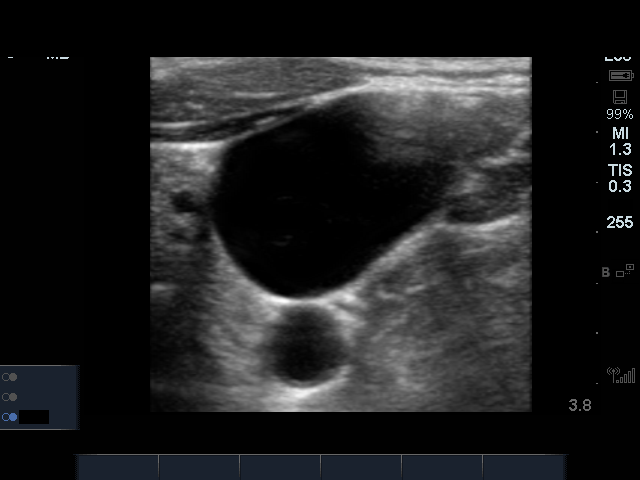
[im 2/4]
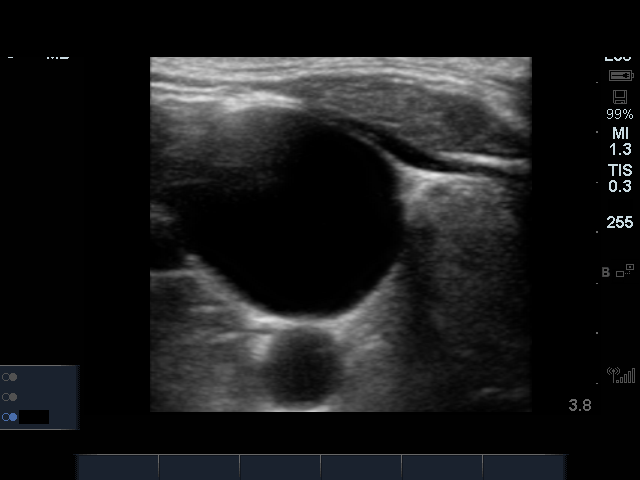
[im 3/4]
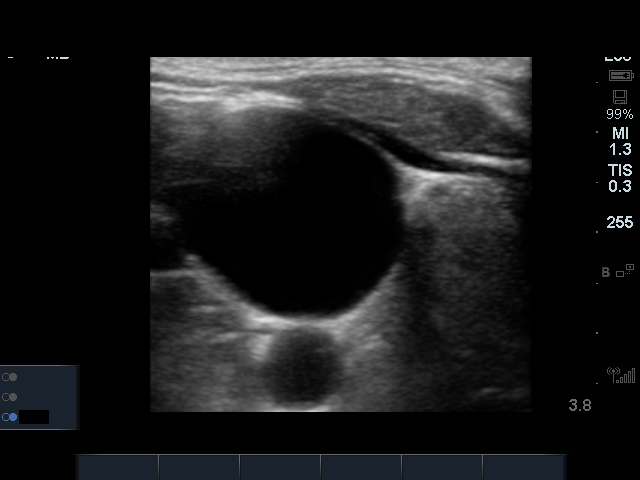
[im 4/4]
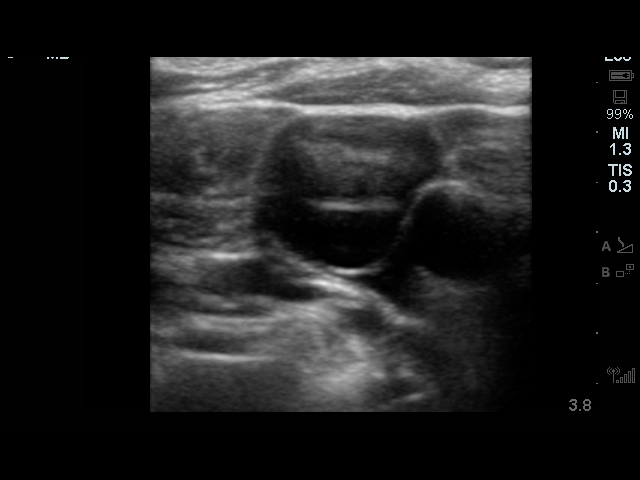

[4 of 4 positions shown; findings below may reference images not displayed]

EXAM:
IMPLANTED PORT A CATH PLACEMENT WITH ULTRASOUND AND FLUOROSCOPIC
GUIDANCE

MEDICATIONS:
2 g Ancef; The antibiotic was administered within an appropriate
time interval prior to skin puncture.

ANESTHESIA/SEDATION:
Versed 2 mg IV; Fentanyl 100 mcg IV;

Moderate Sedation Time:  19 minutes

The patient was continuously monitored during the procedure by the
interventional radiology nurse under my direct supervision.

FLUOROSCOPY TIME:  0 minutes, 24 seconds (20 mGy)

COMPLICATIONS:
None immediate.

PROCEDURE:
The right neck and chest was prepped with chlorhexidine, and draped
in the usual sterile fashion using maximum barrier technique (cap
and mask, sterile gown, sterile gloves, large sterile sheet, hand
hygiene and cutaneous antiseptic). Antibiotic prophylaxis was
provided with 2g Ancef administered IV one hour prior to skin
incision. Local anesthesia was attained by infiltration with 1%
lidocaine with epinephrine.

Ultrasound demonstrated patency of the right internal jugular vein,
and this was documented with an image. Under real-time ultrasound
guidance, this vein was accessed with a 21 gauge micropuncture
needle and image documentation was performed. A small dermatotomy
was made at the access site with an 11 scalpel. A 0.018" wire was
advanced into the SVC and the access needle exchanged for a 4F
micropuncture vascular sheath. The 0.018" wire was then removed and
a 0.035" wire advanced into the IVC.



The venous access site was then serially dilated and a peel away
vascular sheath placed over the wire. The wire was removed and the
port catheter advanced into position under fluoroscopic guidance.
The catheter tip is positioned in the superior cavoatrial junction.
This was documented with a spot image. The portacatheter was then
tested and found to flush and aspirate well. The port was flushed
with saline followed by 100 units/mL heparinized saline.

The pocket was then closed in two layers using first subdermal
inverted interrupted absorbable sutures followed by a running
subcuticular suture. The epidermis was then sealed with Dermabond.
The dermatotomy at the venous access site was also closed with
Dermabond.
IMPRESSION: Successful placement of a right IJ approach Power Port with
ultrasound and fluoroscopic guidance. The catheter is ready for use.

## 2018-01-15 IMAGING — XA IR FLUORO GUIDE CV LINE*L*
1 series · 1 of 1 positions shown · non-contrast
Comparison: none

INDICATION: 61-year-old male with diffuse large B-cell lymphoma in need of
durable venous access to begin chemotherapy.

[Series 300: line placements · 1 of 1 slices shown]
[im 1/1]
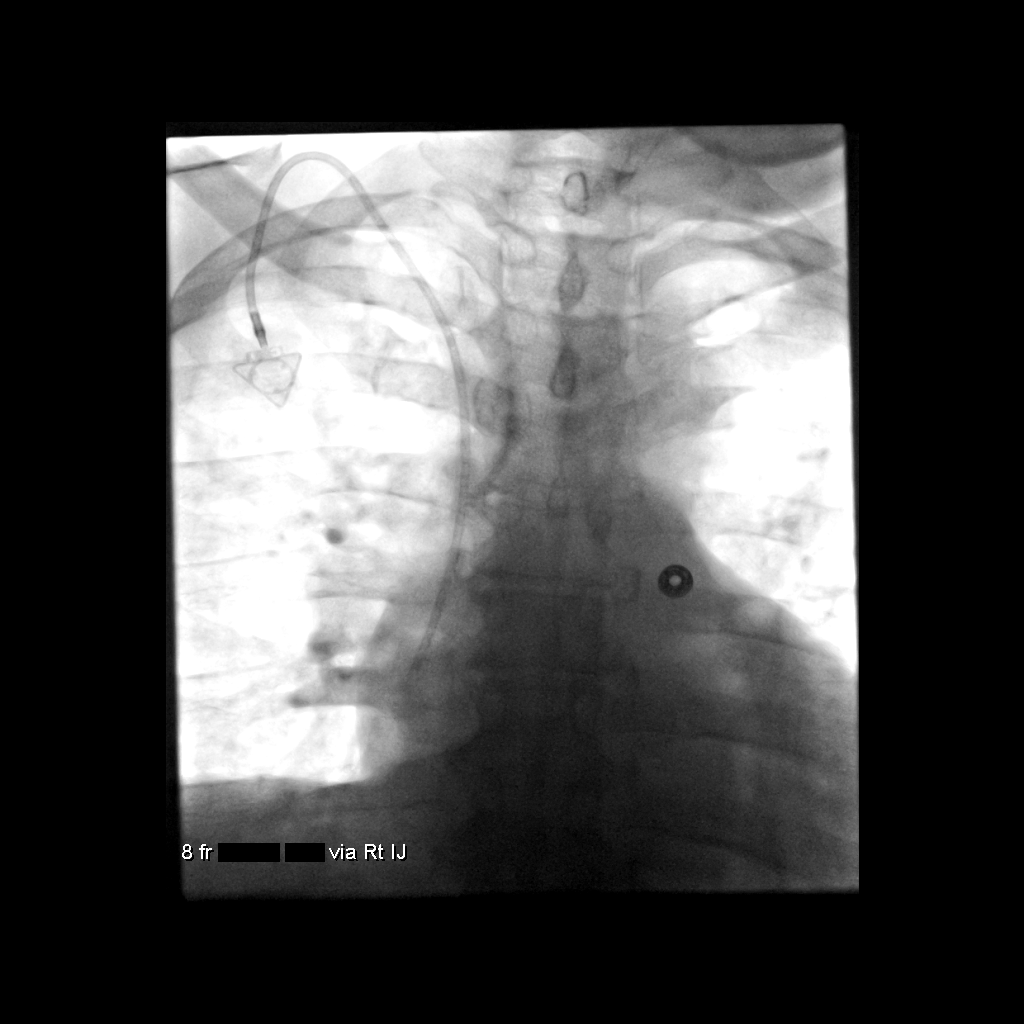

[1 of 1 positions shown; findings below may reference images not displayed]

EXAM:
IMPLANTED PORT A CATH PLACEMENT WITH ULTRASOUND AND FLUOROSCOPIC
GUIDANCE

MEDICATIONS:
2 g Ancef; The antibiotic was administered within an appropriate
time interval prior to skin puncture.

ANESTHESIA/SEDATION:
Versed 2 mg IV; Fentanyl 100 mcg IV;

Moderate Sedation Time:  19 minutes

The patient was continuously monitored during the procedure by the
interventional radiology nurse under my direct supervision.

FLUOROSCOPY TIME:  0 minutes, 24 seconds (20 mGy)

COMPLICATIONS:
None immediate.

PROCEDURE:
The right neck and chest was prepped with chlorhexidine, and draped
in the usual sterile fashion using maximum barrier technique (cap
and mask, sterile gown, sterile gloves, large sterile sheet, hand
hygiene and cutaneous antiseptic). Antibiotic prophylaxis was
provided with 2g Ancef administered IV one hour prior to skin
incision. Local anesthesia was attained by infiltration with 1%
lidocaine with epinephrine.

Ultrasound demonstrated patency of the right internal jugular vein,
and this was documented with an image. Under real-time ultrasound
guidance, this vein was accessed with a 21 gauge micropuncture
needle and image documentation was performed. A small dermatotomy
was made at the access site with an 11 scalpel. A 0.018" wire was
advanced into the SVC and the access needle exchanged for a 4F
micropuncture vascular sheath. The 0.018" wire was then removed and
a 0.035" wire advanced into the IVC.



The venous access site was then serially dilated and a peel away
vascular sheath placed over the wire. The wire was removed and the
port catheter advanced into position under fluoroscopic guidance.
The catheter tip is positioned in the superior cavoatrial junction.
This was documented with a spot image. The portacatheter was then
tested and found to flush and aspirate well. The port was flushed
with saline followed by 100 units/mL heparinized saline.

The pocket was then closed in two layers using first subdermal
inverted interrupted absorbable sutures followed by a running
subcuticular suture. The epidermis was then sealed with Dermabond.
The dermatotomy at the venous access site was also closed with
Dermabond.
IMPRESSION: Successful placement of a right IJ approach Power Port with
ultrasound and fluoroscopic guidance. The catheter is ready for use.

## 2018-01-15 MED ORDER — CEFAZOLIN SODIUM-DEXTROSE 2-4 GM/100ML-% IV SOLN
2.0000 g | INTRAVENOUS | Status: AC
  Administered 2018-01-15: 2 g via INTRAVENOUS
  Filled 2018-01-15: qty 100

## 2018-01-15 MED ORDER — CEFAZOLIN SODIUM-DEXTROSE 2-4 GM/100ML-% IV SOLN
INTRAVENOUS | Status: AC
  Administered 2018-01-15: 2 g via INTRAVENOUS
  Filled 2018-01-15: qty 100

## 2018-01-15 MED ORDER — HEPARIN SOD (PORK) LOCK FLUSH 100 UNIT/ML IV SOLN
500.0000 [IU] | Freq: Once | INTRAVENOUS | Status: DC | PRN
  Filled 2018-01-15: qty 5

## 2018-01-15 MED ORDER — GUAIFENESIN-DM 100-10 MG/5ML PO SYRP: 10.0000 mL | ORAL_SOLUTION | ORAL | Status: DC | PRN

## 2018-01-15 MED ORDER — TRAMADOL HCL 50 MG PO TABS: 100.0000 mg | ORAL_TABLET | Freq: Four times a day (QID) | ORAL | Status: DC | PRN

## 2018-01-15 MED ORDER — HOT PACK MISC ONCOLOGY
1.0000 | Freq: Once | Status: DC | PRN
  Filled 2018-01-15: qty 1

## 2018-01-15 MED ORDER — MIDAZOLAM HCL 2 MG/2ML IJ SOLN
INTRAMUSCULAR | Status: AC | PRN
  Administered 2018-01-15: 2 mg via INTRAVENOUS

## 2018-01-15 MED ORDER — HEPARIN SOD (PORK) LOCK FLUSH 100 UNIT/ML IV SOLN
500.0000 [IU] | INTRAVENOUS | Status: DC | PRN
  Administered 2018-01-19: 500 [IU]

## 2018-01-15 MED ORDER — ALUM & MAG HYDROXIDE-SIMETH 200-200-20 MG/5ML PO SUSP
60.0000 mL | ORAL | Status: DC | PRN
  Administered 2018-01-16: 60 mL via ORAL
  Filled 2018-01-15: qty 60

## 2018-01-15 MED ORDER — SODIUM CHLORIDE 0.9 % IV SOLN
INTRAVENOUS | Status: DC
  Administered 2018-01-15: 20 mL via INTRAVENOUS
  Administered 2018-01-17: 20:00:00 via INTRAVENOUS

## 2018-01-15 MED ORDER — SODIUM CHLORIDE 0.9 % IV SOLN
Freq: Once | INTRAVENOUS | Status: AC
  Administered 2018-01-15: 8 mg via INTRAVENOUS
  Filled 2018-01-15: qty 4

## 2018-01-15 MED ORDER — ALLOPURINOL 100 MG PO TABS
100.0000 mg | ORAL_TABLET | Freq: Two times a day (BID) | ORAL | Status: DC
  Administered 2018-01-15 – 2018-01-19 (×8): 100 mg via ORAL
  Filled 2018-01-15 (×8): qty 1

## 2018-01-15 MED ORDER — HEPARIN SOD (PORK) LOCK FLUSH 100 UNIT/ML IV SOLN: 500.0000 [IU] | INTRAVENOUS | Status: DC

## 2018-01-15 MED ORDER — AMLODIPINE BESYLATE 5 MG PO TABS: 5.0000 mg | ORAL_TABLET | Freq: Every day | ORAL | Status: DC

## 2018-01-15 MED ORDER — SODIUM CHLORIDE 0.9% FLUSH: 3.0000 mL | INTRAVENOUS | Status: DC | PRN

## 2018-01-15 MED ORDER — HEPARIN SOD (PORK) LOCK FLUSH 100 UNIT/ML IV SOLN: 250.0000 [IU] | Freq: Once | INTRAVENOUS | Status: DC | PRN

## 2018-01-15 MED ORDER — ACETAMINOPHEN 325 MG PO TABS: 650.0000 mg | ORAL_TABLET | ORAL | Status: DC | PRN

## 2018-01-15 MED ORDER — LISINOPRIL 20 MG PO TABS
20.0000 mg | ORAL_TABLET | Freq: Every day | ORAL | Status: DC
  Administered 2018-01-16 – 2018-01-19 (×4): 20 mg via ORAL
  Filled 2018-01-15 (×5): qty 1

## 2018-01-15 MED ORDER — ONDANSETRON HCL 4 MG/2ML IJ SOLN: 4.0000 mg | Freq: Three times a day (TID) | INTRAMUSCULAR | Status: DC | PRN

## 2018-01-15 MED ORDER — PREDNISONE 20 MG PO TABS
60.0000 mg | ORAL_TABLET | Freq: Every day | ORAL | Status: DC
  Administered 2018-01-16 – 2018-01-19 (×4): 60 mg via ORAL
  Filled 2018-01-15 (×5): qty 3

## 2018-01-15 MED ORDER — HEPARIN SOD (PORK) LOCK FLUSH 100 UNIT/ML IV SOLN
INTRAVENOUS | Status: AC
  Filled 2018-01-15: qty 5

## 2018-01-15 MED ORDER — ONDANSETRON 4 MG PO TBDP: 4.0000 mg | ORAL_TABLET | Freq: Three times a day (TID) | ORAL | Status: DC | PRN

## 2018-01-15 MED ORDER — COLD PACK MISC ONCOLOGY
1.0000 | Freq: Once | Status: DC | PRN
  Filled 2018-01-15: qty 1

## 2018-01-15 MED ORDER — ALTEPLASE 2 MG IJ SOLR
2.0000 mg | Freq: Once | INTRAMUSCULAR | Status: DC | PRN
  Filled 2018-01-15: qty 2

## 2018-01-15 MED ORDER — HYDROCORTISONE 2.5 % RE CREA: 1.0000 "application " | TOPICAL_CREAM | Freq: Two times a day (BID) | RECTAL | Status: DC | PRN

## 2018-01-15 MED ORDER — AMLODIPINE BESYLATE 5 MG PO TABS
5.0000 mg | ORAL_TABLET | Freq: Every day | ORAL | Status: DC
  Administered 2018-01-16 – 2018-01-19 (×4): 5 mg via ORAL
  Filled 2018-01-15 (×5): qty 1

## 2018-01-15 MED ORDER — SENNOSIDES-DOCUSATE SODIUM 8.6-50 MG PO TABS: 1.0000 | ORAL_TABLET | Freq: Every evening | ORAL | Status: DC | PRN

## 2018-01-15 MED ORDER — LIDOCAINE HCL 1 % IJ SOLN
INTRAMUSCULAR | Status: AC
  Filled 2018-01-15: qty 20

## 2018-01-15 MED ORDER — ASPIRIN EC 81 MG PO TBEC
81.0000 mg | DELAYED_RELEASE_TABLET | Freq: Every day | ORAL | Status: DC
  Administered 2018-01-15 – 2018-01-19 (×5): 81 mg via ORAL
  Filled 2018-01-15 (×6): qty 1

## 2018-01-15 MED ORDER — FENTANYL CITRATE (PF) 100 MCG/2ML IJ SOLN
INTRAMUSCULAR | Status: AC | PRN
  Administered 2018-01-15 (×2): 50 ug via INTRAVENOUS

## 2018-01-15 MED ORDER — ENOXAPARIN SODIUM 40 MG/0.4ML ~~LOC~~ SOLN
40.0000 mg | SUBCUTANEOUS | Status: DC
  Administered 2018-01-15 – 2018-01-18 (×4): 40 mg via SUBCUTANEOUS
  Filled 2018-01-15 (×4): qty 0.4

## 2018-01-15 MED ORDER — VINCRISTINE SULFATE CHEMO INJECTION 1 MG/ML
Freq: Once | INTRAVENOUS | Status: AC
  Administered 2018-01-15: 18:00:00 via INTRAVENOUS
  Filled 2018-01-15: qty 13

## 2018-01-15 MED ORDER — MIDAZOLAM HCL 2 MG/2ML IJ SOLN
INTRAMUSCULAR | Status: AC
  Filled 2018-01-15: qty 4

## 2018-01-15 MED ORDER — SODIUM CHLORIDE 0.9% FLUSH: 10.0000 mL | INTRAVENOUS | Status: DC | PRN

## 2018-01-15 MED ORDER — FENTANYL CITRATE (PF) 100 MCG/2ML IJ SOLN
INTRAMUSCULAR | Status: AC
  Filled 2018-01-15: qty 2

## 2018-01-15 MED ORDER — LIDOCAINE-EPINEPHRINE (PF) 2 %-1:200000 IJ SOLN
INTRAMUSCULAR | Status: AC
  Filled 2018-01-15: qty 20

## 2018-01-15 MED ORDER — ONDANSETRON HCL 4 MG PO TABS: 4.0000 mg | ORAL_TABLET | Freq: Three times a day (TID) | ORAL | Status: DC | PRN

## 2018-01-15 MED ORDER — SODIUM CHLORIDE 0.9 % IV SOLN
8.0000 mg | Freq: Three times a day (TID) | INTRAVENOUS | Status: DC | PRN
  Filled 2018-01-15: qty 4

## 2018-01-15 NOTE — Procedures (Signed)
Interventional Radiology Procedure Note  Procedure: Placement of a right IJ approach single lumen PowerPort.  Tip is positioned at the superior cavoatrial junction and catheter is ready for immediate use.  Complications: No immediate Recommendations:  - Ok to shower tomorrow - Do not submerge for 7 days - Routine line care   Signed,  Hema Lanza K. Dimples Probus, MD   

## 2018-01-15 NOTE — Progress Notes (Signed)
Dosages for Doxorubicin, Etoposide and Vincristine verified with Aloha Gell, RN.

## 2018-01-15 NOTE — Progress Notes (Signed)
HEMATOLOGY/ONCOLOGY CONSULTATION NOTE  Date of Service: 01/15/2018  Patient Care Team: Shirline Frees, MD as PCP - General (Family Medicine)  CHIEF COMPLAINTS/PURPOSE OF CONSULTATION:   T-Cell/histocyte rich Large B-Cell Lymphoma   HISTORY OF PRESENTING ILLNESS:   Jesus [name] is a wonderful 62 y.o. 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  He notes no acute new symptoms since his last clinic visit.  Has been scheduled for inpatient Port-A-Cath placement for chemotherapy today.  Still having the same amount of left lower extremity leg swelling which has not changed. No fevers no chills no night sweats. Eating well. No change in bowel habits or urination. Has completed chemotherapy counseling.  Had some additional questions about chemotherapy that were answered in details.  He is agreeable with plan of treatment and is eager to proceed with treatment for his lymphoma.   MEDICAL HISTORY:  Past Medical History:  Diagnosis Date  . Allergy   . History of kidney stones   . Hyperlipidemia   . Hypertension   . Lymphadenopathy   . Pain, lower leg    Bilateral  . Peripheral arterial disease ([place])   . Pre-diabetes   . Red-green color blindness   . Snores   . Wears glasses     SURGICAL HISTORY: Past Surgical History:  Procedure Laterality Date  . CATARACT EXTRACTION W/ INTRAOCULAR LENS  IMPLANT, BILATERAL    . COLONOSCOPY    . dislodged salava stone    . FRACTURE SURGERY    . HAND ARTHROPLASTY  1995   crushed left hand  . INGUINAL LYMPH NODE BIOPSY Left 01/02/2018   Procedure: LEFT INGUINAL LYMPH NODE BIOPSY;  Surgeon: Rolm Bookbinder, MD;  Location: [facility];  Service: General;  Laterality: Left;  Marland Kitchen MICROLARYNGOSCOPY Left 01/17/2014   Procedure: MICROLARYNGOSCOPY WITH EXCISION OF THE BIOPSY OF LEFT VOCAL CORD LESION;  Surgeon: Izora Gala, MD;  Location: [facility];  Service: ENT;  Laterality: Left;  . ORIF FOOT FRACTURE  2005   left    SOCIAL HISTORY: Social History   Socioeconomic History  . Marital status: Divorced    Spouse name: Not on file  . Number of children: 3  . Years of education: Not on file  . Highest education level: Not on file  Occupational History  . Not on file  Social Needs  . Financial resource strain: Not on file  . Food insecurity:    Worry: Not on file    Inability: Not on file  . Transportation needs:    Medical: Not on file    Non-medical: Not on file  Tobacco Use  . Smoking status: Current Every Day Smoker    Packs/day: 0.50    Years: 36.00    Pack years: 18.00    Types: Cigarettes  . Smokeless tobacco: Never Used  Substance and Sexual Activity  . Alcohol use: Yes    Alcohol/week: 15.0 standard drinks    Types: 10 Cans of beer, 5 Shots of liquor per week    Comment: weekends  . Drug use: Yes    Types: Cocaine    Comment: reports cocaine usage ~2X/ month; last use 12/26/17  . Sexual activity: Not on file  Lifestyle  . Physical activity:    Days per week: Not on file    Minutes per session: Not on file  . Stress: Not on file  Relationships  .  Social connections:    Talks on phone: Not on file    Gets together: Not on file    Attends religious service: Not on file    Active member of club or organization: Not on file    Attends meetings of clubs or organizations: Not on file    Relationship status: Not on file  . Intimate partner violence:    Fear of current or ex partner: Not on file    Emotionally abused: Not on file    Physically abused: Not on file    Forced sexual activity: Not on file  Other Topics Concern  . Not on file  Social History Narrative  . Not on file    FAMILY HISTORY: Family History  Problem Relation Age of Onset  . Breast cancer Mother   . Diabetes Father   . Hypertension Father   . Stroke Father   . Mental illness Sister   . Hypertension Daughter   .  Mental illness Daughter   . Hypertension Brother   . Colon cancer Brother   . Breast cancer Sister     ALLERGIES:  is allergic to bee venom.  MEDICATIONS:  Current Facility-Administered Medications  Medication Dose Route Frequency Provider Last Rate Last Dose  . 0.9 %  sodium chloride infusion   Intravenous Continuous Brunetta Genera, MD 20 mL/hr at 01/16/18 0300    . acetaminophen (TYLENOL) tablet 650 mg  650 mg Oral Q4H PRN Brunetta Genera, MD      . allopurinol (ZYLOPRIM) tablet 100 mg  100 mg Oral BID Brunetta Genera, MD   100 mg at 01/15/18 2223  . alteplase (CATHFLO ACTIVASE) injection 2 mg  2 mg Intracatheter Once PRN Brunetta Genera, MD      . alum & mag hydroxide-simeth (MAALOX/MYLANTA) 200-200-20 MG/5ML suspension 60 mL  60 mL Oral Q4H PRN Brunetta Genera, MD      . amLODipine (NORVASC) tablet 5 mg  5 mg Oral Daily Brunetta Genera, MD      . aspirin EC tablet 81 mg  81 mg Oral Daily Brunetta Genera, MD   81 mg at 01/15/18 1707  . Cold Pack 1 packet  1 packet Topical Once PRN Brunetta Genera, MD      . DOXOrubicin (ADRIAMYCIN) 26 mg, etoposide (VEPESID) 132 mg, vinCRIStine (ONCOVIN) 1.1 mg in sodium chloride 0.9 % 1,000 mL chemo infusion   Intravenous Once Brunetta Genera, MD 51 mL/hr at 01/15/18 1743    . enoxaparin (LOVENOX) injection 40 mg  40 mg Subcutaneous Q24H Brunetta Genera, MD   40 mg at 01/15/18 2223  . guaiFENesin-dextromethorphan (ROBITUSSIN DM) 100-10 MG/5ML syrup 10 mL  10 mL Oral Q4H PRN Brunetta Genera, MD      . heparin lock flush 100 unit/mL  500 Units Intracatheter Once PRN Brunetta Genera, MD      . heparin lock flush 100 unit/mL  250 Units Intracatheter Once PRN Brunetta Genera, MD      . heparin lock flush 100 unit/mL  500 Units Intracatheter Q30 days [name], MD       And  . heparin lock flush 100 unit/mL  500 Units Intracatheter PRN [name], MD      . Hot Pack 1 packet  1  packet Topical Once PRN Brunetta Genera, MD      . hydrocortisone (ANUSOL-HC) 2.5 % rectal cream 1 application  1 application Rectal BID PRN Irene Limbo,  Cloria Spring, MD      . lisinopril (PRINIVIL,ZESTRIL) tablet 20 mg  20 mg Oral Daily Brunetta Genera, MD      . ondansetron [facility]) tablet 4-8 mg  4-8 mg Oral Q8H PRN Brunetta Genera, MD       Or  . ondansetron (ZOFRAN-ODT) disintegrating tablet 4-8 mg  4-8 mg Oral Q8H PRN Brunetta Genera, MD       Or  . ondansetron [facility]) injection 4 mg  4 mg Intravenous Q8H PRN Brunetta Genera, MD       Or  . ondansetron (ZOFRAN) 8 mg in sodium chloride 0.9 % 50 mL IVPB  8 mg Intravenous Q8H PRN Brunetta Genera, MD      . predniSONE (DELTASONE) tablet 60 mg  60 mg Oral QAC breakfast Brunetta Genera, MD      . senna-docusate (Senokot-S) tablet 1 tablet  1 tablet Oral QHS PRN Brunetta Genera, MD      . sodium chloride flush (NS) 0.9 % injection 10 mL  10 mL Intracatheter PRN Brunetta Genera, MD      . sodium chloride flush (NS) 0.9 % injection 3 mL  3 mL Intracatheter PRN Brunetta Genera, MD      . traMADol Veatrice Bourbon) tablet 100 mg  100 mg Oral Q6H PRN Brunetta Genera, MD        REVIEW OF SYSTEMS:    10 Point review of Systems was done is negative except as noted above.  PHYSICAL EXAMINATION: ECOG PERFORMANCE STATUS: 1 - Symptomatic but completely ambulatory  . Vitals:   01/15/18 1038  BP: (!) 141/88  Pulse: 68  Resp: 18  Temp: 97.9 F (36.6 C)  SpO2: 98%   There were no vitals filed for this visit. .There is no height or weight on file to calculate BMI.  GENERAL:alert, in no acute distress and comfortable SKIN: no acute rashes, no significant lesions EYES: conjunctiva are pink and non-injected, sclera anicteric OROPHARYNX: MMM, no exudates, no oropharyngeal erythema or ulceration NECK: supple, no JVD LYMPH:  no palpable lymphadenopathy in the cervical, axillary or inguinal regions LUNGS:  clear to auscultation b/l with normal respiratory effort HEART: regular rate & rhythm ABDOMEN:  normoactive bowel sounds , non tender, not distended. Extremity: no pedal edema PSYCH: alert & oriented x 3 with fluent speech NEURO: no focal motor/sensory deficits  LABORATORY DATA:  I have reviewed the data as listed  . CBC Latest Ref Rng & Units 01/15/2018 01/04/2018 12/26/2017  WBC 4.0 - 10.5 K/uL 7.8 5.4 4.4  Hemoglobin 13.0 - 17.0 g/dL 12.8(L) 12.7(L) 13.0  Hematocrit 39.0 - 52.0 % 39.3 40.2 39.8  Platelets 150 - 400 K/uL 305 236 313    . CMP Latest Ref Rng & Units 01/15/2018 01/04/2018  Glucose 70 - 99 mg/dL 157(H) 148(H)  BUN 8 - 23 mg/dL 18 13  Creatinine 0.61 - 1.24 mg/dL 1.07 1.00  Sodium 135 - 145 mmol/L 144 139  Potassium 3.5 - 5.1 mmol/L 4.3 3.5  Chloride 98 - 111 mmol/L 108 104  CO2 22 - 32 mmol/L 26 26  Calcium 8.9 - 10.3 mg/dL 10.1 9.9  Total Protein 6.5 - 8.1 g/dL 7.3 -  Total Bilirubin 0.3 - 1.2 mg/dL 0.8 -  Alkaline Phos 38 - 126 U/L 61 -  AST 15 - 41 U/L 21 -  ALT 0 - 44 U/L 18 -   Component     Latest Ref Rng & Units  01/15/2018  Prothrombin Time     11.4 - 15.2 seconds 12.7  INR      0.96  Magnesium     1.7 - 2.4 mg/dL 2.0  Phosphorus     2.5 - 4.6 mg/dL 3.7  LDH     98 - 192 U/L 170  Uric Acid, Serum     3.7 - 8.6 mg/dL 6.3   RADIOGRAPHIC STUDIES: I have personally reviewed the radiological images as listed and agreed with the findings in the report. Ct Abdomen Pelvis W Contrast  Result Date: 12/22/2017 CLINICAL DATA:  Patient with lower abdominal and groin pain. EXAM: CT ABDOMEN AND PELVIS WITH CONTRAST TECHNIQUE: Multidetector CT imaging of the abdomen and pelvis was performed using the standard protocol following bolus administration of intravenous contrast. CONTRAST:  135m ISOVUE-300 IOPAMIDOL (ISOVUE-300) INJECTION 61% COMPARISON:  CT abdomen pelvis 02/12/2010. FINDINGS: Lower chest: Normal heart size. Dependent atelectasis bilateral lower lobes. No  pleural effusion. Hepatobiliary: Liver is normal in size and contour. No focal hepatic lesions identified. Gallbladder is decompressed. No intrahepatic or extrahepatic biliary ductal dilatation. Pancreas: Unremarkable Spleen: Unremarkable Adrenals/Urinary Tract: Adrenal glands are normal. Multiple bilateral renal cyst. No hydronephrosis. Urinary bladder is decompressed. Stomach/Bowel: Sigmoid colonic diverticulosis. No CT evidence for acute diverticulitis. Normal appendix. No evidence for small bowel obstruction. Small hiatal hernia. Normal morphology of the stomach. Vascular/Lymphatic: Normal caliber abdominal aorta. Peripheral calcified atherosclerotic plaque. Multiple enlarged retroperitoneal, pelvic and left inguinal lymph nodes are demonstrated. Reference 4.2 cm left periaortic lymph node (image 55; series 2). Reference 13.4 x 6.8 cm left pelvic lymph node (image 74; series 2). Reference 5.2 x 6.1 cm left inguinal lymph node (image 86; series 2). Reproductive: Heterogeneous prostate. Other: Small fat containing right inguinal hernia. Musculoskeletal: Lumbar spine degenerative changes. Heterogeneous attenuation of the ileum bilaterally, unchanged from prior. IMPRESSION: 1. Bulky left inguinal, left hemi pelvic and retroperitoneal adenopathy. Considerations include metastatic disease or lymphoma. Electronically Signed   By: DLovey NewcomerM.D.   On: 12/22/2017 22:00   Nm Pet Image Initial (pi) Skull Base To Thigh  Result Date: 01/07/2018 CLINICAL DATA:  Initial treatment strategy for B-cell lymphoma. EXAM: NUCLEAR MEDICINE PET SKULL BASE TO THIGH TECHNIQUE: 14.9 mCi F-18 FDG was injected intravenously. Full-ring PET imaging was performed from the skull base to thigh after the radiotracer. CT data was obtained and used for attenuation correction and anatomic localization. Fasting blood glucose: 98 mg/dl COMPARISON:  CT 12/22/2017 FINDINGS: Mediastinal blood pool activity: SUV max 2.17 NECK: No hypermetabolic lymph  nodes in the neck. Incidental CT findings: none CHEST: Small hypermetabolic lymph node in the LEFT upper mediastinum adjacent to the LEFT subclavian artery measures only 6 mm but has intense metabolic activity SUV max equal 10.9. No additional mediastinal adenopathy no suspicious pulmonary nodularity. Incidental CT findings: none ABDOMEN/PELVIS: Intense hypermetabolic adenopathy in the porta hepatis. The nodes are mildly enlarged at 15 mm but with intense metabolic activity (SUV max equal 27.2). Hypermetabolic adenopathy in the retroperitoneum LEFT of the aorta measures 3.5 cm with SUV max equal 36.9. Adenopathy extends along the LEFT iliac vessels. Massively enlarged LEFT external iliac lymph node with some central necrosis measures 8.0 x 12.5 cm and with SUV max equal 36.7. Enlarged LEFT inguinal lymph node with SUV max equal 38. No abnormal activity in the liver. The spleen is normal size without metabolic activity. No abnormal activity in the bowel. The bladder is displaced rightward by the a LEFT pelvic adenopathy. Incidental CT findings: Multiple benign-appearing cysts  within the kidneys SKELETON: There several discrete metabolic foci within the skeleton without clear CT correlation. For example lesion in the LEFT sacral ala with SUV max equal 12.3. Lesion in theupper thoracic spine with SUV max equal 23.9. Lesion inferior body of LEFT scapula with SUV max equal 8.5 Incidental CT findings: none IMPRESSION: 1. Massively enlarged pelvic lymph nodes intense metabolic activity consistent lymphoma. Additional 2. Additional hypermetabolic lymph nodes in the porta hepatis and retroperitoneum LEFT aorta. 3. Solitary hypermetabolic mediastinal lymph node in the upper LEFT mediastinum. 4. 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 5. Normal spleen. Electronically Signed   By: Suzy Bouchard M.D.   On: 01/07/2018 10:55   Dg Abd 2 Views  Result Date: 12/22/2017 CLINICAL  DATA:  Left groin swelling. EXAM: ABDOMEN - 2 VIEW COMPARISON:  CT scan of February 12, 2010. FINDINGS: The bowel gas pattern is normal. There is no evidence of free air. No radio-opaque calculi or other significant radiographic abnormality is seen. IMPRESSION: No evidence of bowel obstruction or ileus. Electronically Signed   By: Marijo Conception, M.D.   On: 12/22/2017 15:16   [place] Core Biopsy (lymph Nodes)  Result Date: 12/26/2017 CLINICAL DATA:  Enlarged left inguinal lymph node. EXAM: ULTRASOUND GUIDED CORE BIOPSY OF LEFT INGUINAL LYMPH NODE MEDICATIONS: 1.0 mg IV Versed; 50 mcg IV Fentanyl Total Moderate Sedation Time: 13 minutes. The patient's level of consciousness and physiologic status were continuously monitored during the procedure by Radiology nursing. PROCEDURE: The procedure, risks, benefits, and alternatives were explained to the patient. Questions regarding the procedure were encouraged and answered. The patient understands and consents to the procedure. A time out was performed prior to initiating the procedure. Ultrasound was performed of the left groin. The left inguinal region was prepped with chlorhexidine in a sterile fashion, and a sterile drape was applied covering the operative field. A sterile gown and sterile gloves were used for the procedure. Local anesthesia was provided with 1% Lidocaine. Under ultrasound guidance, a total of 6 separate 16 gauge core biopsy samples were obtained through different portions of an enlarged left inguinal lymph node. Core biopsy samples were submitted in saline. Additional ultrasound was performed after biopsy. COMPLICATIONS: None. FINDINGS: Large hypoechoic left inguinal lymph node measures approximately 7 cm in maximum diameter by ultrasound. The lymph node was sampled in several different portions by core biopsy. Solid tissue was obtained. Post biopsy imaging shows no evidence of hemorrhage. IMPRESSION: Ultrasound-guided core biopsy performed of enlarged  left inguinal lymph node measuring 7 cm in maximum diameter by ultrasound. Six total 16 gauge core biopsy samples were obtained and submitted in saline. Electronically Signed   By: Aletta Edouard M.D.   On: 12/26/2017 17:06    ASSESSMENT & PLAN:   62 y.o. 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  CT of the abdomen and pelvis performed on 12/22/2017 showed bulky left inguinal, left hemipelvic, and  retroperitoneal adenopathy.    01/02/18 Left inguinal LN Biopsy revealed T-Cell/histocyte rich Large B-Cell Lymphoma  12/27/17 ECHO revealed LV EF of 55-60%   01/05/18 PET/CT revealed Massively enlarged pelvic lymph nodes intense metabolic activity consistent lymphoma. 2. Additional hypermetabolic lymph nodes in the porta hepatis and retroperitoneum LEFT aorta. 3. Solitary hypermetabolic mediastinal lymph node in the upper LEFT Mediastinum. 4. Multiple discrete sites of hypermetabolic skeletal metastasis (approximately  5 sites). 5. Normal spleen.    HIV non reactive on 12/22/2017.  Hep C and hep B serology negative.  2) left lower extremity swelling-stable  Doppler ultrasound for DVT was negative in the left lower extremity.   Likely from venous compression +/- lymphatic obstruction from bulky left inguinal, left hemipelvic, and  retroperitoneal adenopathy.   3) Intermediate to high risk for tumor lysis syndrome.  4) S/p Port a cath placement today PLAN: -Labs done today and evaluated in stable.  Normal uric acid blood counts and chemistries. -Patient got a Port-A-Cath placement without any concerns today.  Appreciate help from interventional radiology. -Patient appropriate to proceed with cycle 1 of EPOCH-R.  -Rituxan on Day 5 -We will plan to do outpatient Rituxan after the first  cycle -Neulasta as outpatient on 01/22/2018. -We will monitor labs daily for blood counts chemistries and tumor lysis syndrome concerns. -Patient has been on allopurinol level continue allopurinol at current dose 100 mg p.o. twice daily -We will watch for increasing leg swelling and fluid overload.  4) Smoker and previous h/o cocaine use --The patient was counseled on smoking cessation.  The patient was advised to cut back on his alcohol intake and to stop using cocaine.  5) DVT prophylaxis -lovenox, SCD, ambulation.  6) HTN -Continue outpatient antihypertensives and monitor   All of the patients questions were answered with apparent satisfaction. The patient knows to call the clinic with any problems, questions or concerns.  The total time spent in the appt was 70 minutes and more than 50% was on counseling and direct patient cares.    Sullivan Lone MD MS AAHIVMS [facility] Hematology/Oncology Physician [facility]  (Office):       [phone] (Work cell):  [phone] (Fax):           [phone]  01/15/2018 10:44 AM  I, Baldwin [place], am acting as a scribe for Dr. Irene Limbo  .I have reviewed the above documentation for accuracy and completeness, and I agree with the above. Sullivan Lone MD MS

## 2018-01-15 NOTE — Consult Note (Signed)
Chief Complaint: Patient was seen in consultation today for diffuse large B-cell lymphoma  Referring Physician(s): Dr. Irene Limbo  Supervising Physician: [name]  Patient Status: [facility] - In-pt  History of Present Illness: Jesus [name] is a 62 y.o. male with past medical history of left leg edema and swelling who was found to have pelvic and retroperitoneal lymphadenopathy.  Biopsy performed by surgery confirmed diffuse large B-cell lymphoma.  Patient is now being admitted for initiation of chemotherapy at the recommendation of Dr. Irene Limbo.  He is in need of durable venous access.  Consult placed for Port-A-Cath placement.  Patient has been NPO today.  He does not take blood thinners.    Past Medical History:  Diagnosis Date  . Allergy   . History of kidney stones   . Hyperlipidemia   . Hypertension   . Lymphadenopathy   . Pain, lower leg    Bilateral  . Peripheral arterial disease ([place])   . Pre-diabetes   . Red-green color blindness   . Snores   . Wears glasses     Past Surgical History:  Procedure Laterality Date  . CATARACT EXTRACTION W/ INTRAOCULAR LENS  IMPLANT, BILATERAL    . COLONOSCOPY    . dislodged salava stone    . FRACTURE SURGERY    . HAND ARTHROPLASTY  1995   crushed left hand  . INGUINAL LYMPH NODE BIOPSY Left 01/02/2018   Procedure: LEFT INGUINAL LYMPH NODE BIOPSY;  Surgeon: Rolm Bookbinder, MD;  Location: [place];  Service: General;  Laterality: Left;  Marland Kitchen MICROLARYNGOSCOPY Left 01/17/2014   Procedure: MICROLARYNGOSCOPY WITH EXCISION OF THE BIOPSY OF LEFT VOCAL CORD LESION;  Surgeon: Izora Gala, MD;  Location: [place];  Service: ENT;  Laterality: Left;  . ORIF FOOT FRACTURE  2005   left    Allergies: Bee venom  Medications: Prior to Admission medications   Medication Sig Start Date End Date Taking? Authorizing Provider  allopurinol (ZYLOPRIM) 100 MG tablet Take 1 tablet (100 mg total) by mouth 2 (two) times daily. 01/12/18   Yes Brunetta Genera, MD  amLODipine (NORVASC) 10 MG tablet Take 10 mg by mouth daily. 12/25/17  Yes [provider]  aspirin 81 MG tablet Take 81 mg by mouth daily.   Yes [provider]  CIALIS 20 MG tablet Take 20 mg by mouth daily as needed for erectile dysfunction.  04/22/14  Yes [provider]  dexamethasone (DECADRON) 4 MG tablet Take 3 tablets (12 mg total) by mouth daily with breakfast for 3 days. 01/12/18 01/15/18 Yes Brunetta Genera, MD  lisinopril (PRINIVIL,ZESTRIL) 20 MG tablet Take 20 mg by mouth daily.   Yes [provider]  Multiple Vitamins-Minerals (MULTIVITAMIN WITH MINERALS) tablet Take 1 tablet by mouth daily.   Yes [provider]  naproxen (NAPROSYN) 500 MG tablet Take 500 mg by mouth 2 (two) times daily as needed for moderate pain.    Yes [provider]  ondansetron (ZOFRAN) 8 MG tablet Take 1 tablet (8 mg total) by mouth every 8 (eight) hours as needed for nausea or vomiting. 01/12/18   Brunetta Genera, MD  traMADol (ULTRAM) 50 MG tablet Take 2 tablets (100 mg total) by mouth every 6 (six) hours as needed. Patient taking differently: Take 100 mg by mouth every 6 (six) hours as needed for moderate pain.  01/02/18   Rolm Bookbinder, MD     Family History  Problem Relation Age of Onset  . Breast cancer  Mother   . Diabetes Father   . Hypertension Father   . Stroke Father   . Mental illness Sister   . Hypertension Daughter   . Mental illness Daughter   . Hypertension Brother   . Colon cancer Brother   . Breast cancer Sister     Social History   Socioeconomic History  . Marital status: Divorced    Spouse name: Not on file  . Number of children: 3  . Years of education: Not on file  . Highest education level: Not on file  Occupational History  . Not on file  Social Needs  . Financial resource strain: Not on file  . Food insecurity:    Worry: Not on file    Inability: Not on file  .  Transportation needs:    Medical: Not on file    Non-medical: Not on file  Tobacco Use  . Smoking status: Current Every Day Smoker    Packs/day: 0.50    Years: 36.00    Pack years: 18.00    Types: Cigarettes  . Smokeless tobacco: Never Used  Substance and Sexual Activity  . Alcohol use: Yes    Alcohol/week: 15.0 standard drinks    Types: 10 Cans of beer, 5 Shots of liquor per week    Comment: weekends  . Drug use: Yes    Types: Cocaine    Comment: reports cocaine usage ~2X/ month; last use 12/26/17  . Sexual activity: Not on file  Lifestyle  . Physical activity:    Days per week: Not on file    Minutes per session: Not on file  . Stress: Not on file  Relationships  . Social connections:    Talks on phone: Not on file    Gets together: Not on file    Attends religious service: Not on file    Active member of club or organization: Not on file    Attends meetings of clubs or organizations: Not on file    Relationship status: Not on file  Other Topics Concern  . Not on file  Social History Narrative  . Not on file     Review of Systems: A 12 point ROS discussed and pertinent positives are indicated in the HPI above.  All other systems are negative.  Review of Systems  Constitutional: Positive for fatigue. Negative for fever.  Respiratory: Negative for cough and shortness of breath.   Cardiovascular: Negative for chest pain.  Gastrointestinal: Negative for abdominal pain.  Genitourinary: Negative for dysuria and flank pain.  Musculoskeletal: Negative for back pain.  Hematological: Positive for adenopathy.  Psychiatric/Behavioral: Negative for behavioral problems and confusion.    Vital Signs: BP (!) 141/88 (BP Location: Left Arm)   Pulse 68   Temp 97.9 F (36.6 C) (Oral)   Resp 18   SpO2 98%   Physical Exam  Constitutional: He is oriented to person, place, and time. He appears well-developed. No distress.  Neck: Normal range of motion. Neck supple. No tracheal  deviation present.  Cardiovascular: Normal rate, regular rhythm and normal heart sounds. Exam reveals no gallop and no friction rub.  No murmur heard. Pulmonary/Chest: Effort normal and breath sounds normal. No respiratory distress.  Lymphadenopathy:    He has no cervical adenopathy.  Neurological: He is alert and oriented to person, place, and time.  Skin: Skin is warm and dry. He is not diaphoretic.  Psychiatric: He has a normal mood and affect. His behavior is normal. Judgment and thought content  normal.  Nursing note and vitals reviewed.    MD Evaluation Airway: WNL Heart: WNL Abdomen: WNL Chest/ Lungs: WNL ASA  Classification: 2 Mallampati/Airway Score: Two   Imaging: Ct Abdomen Pelvis W Contrast  Result Date: 12/22/2017 CLINICAL DATA:  Patient with lower abdominal and groin pain. EXAM: CT ABDOMEN AND PELVIS WITH CONTRAST TECHNIQUE: Multidetector CT imaging of the abdomen and pelvis was performed using the standard protocol following bolus administration of intravenous contrast. CONTRAST:  139m ISOVUE-300 IOPAMIDOL (ISOVUE-300) INJECTION 61% COMPARISON:  CT abdomen pelvis 02/12/2010. FINDINGS: Lower chest: Normal heart size. Dependent atelectasis bilateral lower lobes. No pleural effusion. Hepatobiliary: Liver is normal in size and contour. No focal hepatic lesions identified. Gallbladder is decompressed. No intrahepatic or extrahepatic biliary ductal dilatation. Pancreas: Unremarkable Spleen: Unremarkable Adrenals/Urinary Tract: Adrenal glands are normal. Multiple bilateral renal cyst. No hydronephrosis. Urinary bladder is decompressed. Stomach/Bowel: Sigmoid colonic diverticulosis. No CT evidence for acute diverticulitis. Normal appendix. No evidence for small bowel obstruction. Small hiatal hernia. Normal morphology of the stomach. Vascular/Lymphatic: Normal caliber abdominal aorta. Peripheral calcified atherosclerotic plaque. Multiple enlarged retroperitoneal, pelvic and left  inguinal lymph nodes are demonstrated. Reference 4.2 cm left periaortic lymph node (image 55; series 2). Reference 13.4 x 6.8 cm left pelvic lymph node (image 74; series 2). Reference 5.2 x 6.1 cm left inguinal lymph node (image 86; series 2). Reproductive: Heterogeneous prostate. Other: Small fat containing right inguinal hernia. Musculoskeletal: Lumbar spine degenerative changes. Heterogeneous attenuation of the ileum bilaterally, unchanged from prior. IMPRESSION: 1. Bulky left inguinal, left hemi pelvic and retroperitoneal adenopathy. Considerations include metastatic disease or lymphoma. Electronically Signed   By: DLovey NewcomerM.D.   On: 12/22/2017 22:00   Nm Pet Image Initial (pi) Skull Base To Thigh  Result Date: 01/07/2018 CLINICAL DATA:  Initial treatment strategy for B-cell lymphoma. EXAM: NUCLEAR MEDICINE PET SKULL BASE TO THIGH TECHNIQUE: 14.9 mCi F-18 FDG was injected intravenously. Full-ring PET imaging was performed from the skull base to thigh after the radiotracer. CT data was obtained and used for attenuation correction and anatomic localization. Fasting blood glucose: 98 mg/dl COMPARISON:  CT 12/22/2017 FINDINGS: Mediastinal blood pool activity: SUV max 2.17 NECK: No hypermetabolic lymph nodes in the neck. Incidental CT findings: none CHEST: Small hypermetabolic lymph node in the LEFT upper mediastinum adjacent to the LEFT subclavian artery measures only 6 mm but has intense metabolic activity SUV max equal 10.9. No additional mediastinal adenopathy no suspicious pulmonary nodularity. Incidental CT findings: none ABDOMEN/PELVIS: Intense hypermetabolic adenopathy in the porta hepatis. The nodes are mildly enlarged at 15 mm but with intense metabolic activity (SUV max equal 27.2). Hypermetabolic adenopathy in the retroperitoneum LEFT of the aorta measures 3.5 cm with SUV max equal 36.9. Adenopathy extends along the LEFT iliac vessels. Massively enlarged LEFT external iliac lymph node with some  central necrosis measures 8.0 x 12.5 cm and with SUV max equal 36.7. Enlarged LEFT inguinal lymph node with SUV max equal 38. No abnormal activity in the liver. The spleen is normal size without metabolic activity. No abnormal activity in the bowel. The bladder is displaced rightward by the a LEFT pelvic adenopathy. Incidental CT findings: Multiple benign-appearing cysts within the kidneys SKELETON: There several discrete metabolic foci within the skeleton without clear CT correlation. For example lesion in the LEFT sacral ala with SUV max equal 12.3. Lesion in theupper thoracic spine with SUV max equal 23.9. Lesion inferior body of LEFT scapula with SUV max equal 8.5 Incidental CT findings: none IMPRESSION: 1. Massively  enlarged pelvic lymph nodes intense metabolic activity consistent lymphoma. Additional 2. Additional hypermetabolic lymph nodes in the porta hepatis and retroperitoneum LEFT aorta. 3. Solitary hypermetabolic mediastinal lymph node in the upper LEFT mediastinum. 4. 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 5. Normal spleen. Electronically Signed   By: Suzy Bouchard M.D.   On: 01/07/2018 10:55   Dg Abd 2 Views  Result Date: 12/22/2017 CLINICAL DATA:  Left groin swelling. EXAM: ABDOMEN - 2 VIEW COMPARISON:  CT scan of February 12, 2010. FINDINGS: The bowel gas pattern is normal. There is no evidence of free air. No radio-opaque calculi or other significant radiographic abnormality is seen. IMPRESSION: No evidence of bowel obstruction or ileus. Electronically Signed   By: Marijo Conception, M.D.   On: 12/22/2017 15:16   [place] Core Biopsy (lymph Nodes)  Result Date: 12/26/2017 CLINICAL DATA:  Enlarged left inguinal lymph node. EXAM: ULTRASOUND GUIDED CORE BIOPSY OF LEFT INGUINAL LYMPH NODE MEDICATIONS: 1.0 mg IV Versed; 50 mcg IV Fentanyl Total Moderate Sedation Time: 13 minutes. The patient's level of consciousness and physiologic status were continuously  monitored during the procedure by Radiology nursing. PROCEDURE: The procedure, risks, benefits, and alternatives were explained to the patient. Questions regarding the procedure were encouraged and answered. The patient understands and consents to the procedure. A time out was performed prior to initiating the procedure. Ultrasound was performed of the left groin. The left inguinal region was prepped with chlorhexidine in a sterile fashion, and a sterile drape was applied covering the operative field. A sterile gown and sterile gloves were used for the procedure. Local anesthesia was provided with 1% Lidocaine. Under ultrasound guidance, a total of 6 separate 16 gauge core biopsy samples were obtained through different portions of an enlarged left inguinal lymph node. Core biopsy samples were submitted in saline. Additional ultrasound was performed after biopsy. COMPLICATIONS: None. FINDINGS: Large hypoechoic left inguinal lymph node measures approximately 7 cm in maximum diameter by ultrasound. The lymph node was sampled in several different portions by core biopsy. Solid tissue was obtained. Post biopsy imaging shows no evidence of hemorrhage. IMPRESSION: Ultrasound-guided core biopsy performed of enlarged left inguinal lymph node measuring 7 cm in maximum diameter by ultrasound. Six total 16 gauge core biopsy samples were obtained and submitted in saline. Electronically Signed   By: Aletta Edouard M.D.   On: 12/26/2017 17:06    Labs:  CBC: Recent Labs    12/22/17 2218 12/26/17 1102 01/04/18 1939 01/15/18 1151  WBC 5.6 4.4 5.4 7.8  HGB 12.6* 13.0 12.7* 12.8*  HCT 38.6* 39.8 40.2 39.3  PLT 366 313 236 305    COAGS: Recent Labs    12/26/17 1149 01/15/18 1151  INR 1.00 0.96    BMP: Recent Labs    12/22/17 2122 12/22/17 2218 01/04/18 1939 01/15/18 1151  NA  --  144 139 144  K  --  4.0 3.5 4.3  CL  --  108 104 108  CO2  --  27 26 26   GLUCOSE  --  106* 148* 157*  BUN  --  19 13 18     CALCIUM  --  9.6 9.9 10.1  CREATININE 1.00 1.04 1.00 1.07  GFRNONAA  --  >60 >60 >60  GFRAA  --  >60 >60 >60    LIVER FUNCTION TESTS: Recent Labs    03/28/17 1410 12/22/17 2218 01/15/18 1151  BILITOT 0.4 0.6 0.8  AST 15 23 21   ALT 13 15 18  ALKPHOS  --  51 61  PROT 7.0 7.0 7.3  ALBUMIN  --  4.0 4.3    TUMOR MARKERS: No results for input(s): AFPTM, CEA, CA199, CHROMGRNA in the last 8760 hours.  Assessment and Plan: Diffuse large B-cell lymphoma Patient being admitted for initiation of chemotherapy.  He is in need of access.   A Port-A-Cath placement is requested by Dr. Irene Limbo.  Patient has been NPO today.  He does not take blood thinners.  Labs WNL for procedure.  Plan to proceed today as schedule allows.  Thank you for this interesting consult.  I greatly enjoyed meeting Jesus [name] and look forward to participating in their care.  A copy of this report was sent to the requesting provider on this date.  Electronically Signed: Docia Barrier, PA 01/15/2018, 2:45 PM   I spent a total of 40 Minutes    in face to face in clinical consultation, greater than 50% of which was counseling/coordinating care for diffuse B-cell lymphoma.

## 2018-01-15 NOTE — Progress Notes (Addendum)
Chemo orders were calculated and verified with Recardo Evangelist calculation

## 2018-01-16 DIAGNOSIS — Z9189 Other specified personal risk factors, not elsewhere classified: Secondary | ICD-10-CM

## 2018-01-16 DIAGNOSIS — Z5111 Encounter for antineoplastic chemotherapy: Secondary | ICD-10-CM

## 2018-01-16 DIAGNOSIS — M7989 Other specified soft tissue disorders: Secondary | ICD-10-CM

## 2018-01-16 LAB — COMPREHENSIVE METABOLIC PANEL WITH GFR
ALT: 17 U/L (ref 0–44)
AST: 19 U/L (ref 15–41)
BUN: 23 mg/dL (ref 8–23)
CO2: 28 mmol/L (ref 22–32)
Chloride: 106 mmol/L (ref 98–111)
Creatinine, Ser: 1.04 mg/dL (ref 0.61–1.24)
GFR calc non Af Amer: >60 mL/min (ref >60)

## 2018-01-16 LAB — CBC
HCT: 38.1 % — ABNORMAL LOW (ref 39.0–52.0)
Hemoglobin: 12.4 g/dL — ABNORMAL LOW (ref 13.0–17.0)
MCH: 29.9 pg (ref 26.0–34.0)
MCHC: 32.5 g/dL (ref 30.0–36.0)
MCV: 91.8 fL (ref 78.0–100.0)
Platelets: 274 10*3/uL (ref 150–400)
RBC: 4.15 MIL/uL — ABNORMAL LOW (ref 4.22–5.81)
RDW: 13.9 % (ref 11.5–15.5)
WBC: 10.4 10*3/uL (ref 4.0–10.5)

## 2018-01-16 LAB — URIC ACID: Uric Acid, Serum: 5.4 mg/dL (ref 3.7–8.6)

## 2018-01-16 LAB — COMPREHENSIVE METABOLIC PANEL
Albumin: 3.8 g/dL (ref 3.5–5.0)
Alkaline Phosphatase: 53 U/L (ref 38–126)
Anion gap: 8 (ref 5–15)
Calcium: 9.6 mg/dL (ref 8.9–10.3)
GFR calc Af Amer: >60 mL/min (ref >60)
Glucose, Bld: 192 mg/dL — ABNORMAL HIGH (ref 70–99)
Potassium: 4.1 mmol/L (ref 3.5–5.1)
Sodium: 142 mmol/L (ref 135–145)
Total Bilirubin: 0.7 mg/dL (ref 0.3–1.2)
Total Protein: 6.6 g/dL (ref 6.5–8.1)

## 2018-01-16 LAB — PHOSPHORUS: Phosphorus: 4.1 mg/dL (ref 2.5–4.6)

## 2018-01-16 LAB — LACTATE DEHYDROGENASE: LDH: 151 U/L (ref 98–192)

## 2018-01-16 MED ORDER — SODIUM CHLORIDE 0.9 % IV SOLN
Freq: Once | INTRAVENOUS | Status: AC
  Administered 2018-01-16: 8 mg via INTRAVENOUS
  Filled 2018-01-16: qty 4

## 2018-01-16 MED ORDER — VINCRISTINE SULFATE CHEMO INJECTION 1 MG/ML
Freq: Once | INTRAVENOUS | Status: AC
  Administered 2018-01-16: 15:00:00 via INTRAVENOUS
  Filled 2018-01-16: qty 13

## 2018-01-16 NOTE — H&P (Signed)
HEMATOLOGY/ONCOLOGY H&P NOTE  Date of Service: 01/16/2018  Patient Care Team: Shirline Frees, MD as PCP - General (Family Medicine)  CHIEF COMPLAINTS/PURPOSE OF CONSULTATION:   T-Cell/histocyte rich Large B-Cell Lymphoma   HISTORY OF PRESENTING ILLNESS:   Jesus [name] is a wonderful 62 y.o. 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  He notes no acute new symptoms since his last clinic visit.  Has been scheduled for inpatient Port-A-Cath placement for chemotherapy today.  Still having the same amount of left lower extremity leg swelling which has not changed. No fevers no chills no night sweats. Eating well. No change in bowel habits or urination. Has completed chemotherapy counseling.  Had some additional questions about chemotherapy that were answered in details.  He is agreeable with plan of treatment and is eager to proceed with treatment for his lymphoma.   MEDICAL HISTORY:  Past Medical History:  Diagnosis Date  . Allergy   . History of kidney stones   . Hyperlipidemia   . Hypertension   . Lymphadenopathy   . Pain, lower leg    Bilateral  . Peripheral arterial disease ([place])   . Pre-diabetes   . Red-green color blindness   . Snores   . Wears glasses     SURGICAL HISTORY: Past Surgical History:  Procedure Laterality Date  . CATARACT EXTRACTION W/ INTRAOCULAR LENS  IMPLANT, BILATERAL    . COLONOSCOPY    . dislodged salava stone    . FRACTURE SURGERY    . HAND ARTHROPLASTY  1995   crushed left hand  . INGUINAL LYMPH NODE BIOPSY Left 01/02/2018   Procedure: LEFT INGUINAL LYMPH NODE BIOPSY;  Surgeon: Rolm Bookbinder, MD;  Location: [place];  Service: General;  Laterality: Left;  . IR IMAGING GUIDED PORT INSERTION  01/15/2018  . MICROLARYNGOSCOPY Left 01/17/2014   Procedure: MICROLARYNGOSCOPY WITH EXCISION OF THE BIOPSY OF LEFT VOCAL CORD LESION;  Surgeon: Izora Gala,  MD;  Location: [place];  Service: ENT;  Laterality: Left;  . ORIF FOOT FRACTURE  2005   left    SOCIAL HISTORY: Social History   Socioeconomic History  . Marital status: Divorced    Spouse name: Not on file  . Number of children: 3  . Years of education: Not on file  . Highest education level: Not on file  Occupational History  . Not on file  Social Needs  . Financial resource strain: Not on file  . Food insecurity:    Worry: Not on file    Inability: Not on file  . Transportation needs:    Medical: Not on file    Non-medical: Not on file  Tobacco Use  . Smoking status: Current Every Day Smoker    Packs/day: 0.50    Years: 36.00    Pack years: 18.00    Types: Cigarettes  . Smokeless tobacco: Never Used  Substance and Sexual Activity  . Alcohol use: Yes    Alcohol/week: 15.0 standard drinks    Types: 10 Cans of beer, 5 Shots of liquor per week    Comment: weekends  . Drug use: Yes    Types: Cocaine    Comment: reports cocaine usage ~2X/ month; last use 12/26/17  . Sexual activity: Not on file  Lifestyle  . Physical activity:    Days per week: Not on file    Minutes per session: Not on file  .  Stress: Not on file  Relationships  . Social connections:    Talks on phone: Not on file    Gets together: Not on file    Attends religious service: Not on file    Active member of club or organization: Not on file    Attends meetings of clubs or organizations: Not on file    Relationship status: Not on file  . Intimate partner violence:    Fear of current or ex partner: Not on file    Emotionally abused: Not on file    Physically abused: Not on file    Forced sexual activity: Not on file  Other Topics Concern  . Not on file  Social History Narrative  . Not on file    FAMILY HISTORY: Family History  Problem Relation Age of Onset  . Breast cancer Mother   . Diabetes Father   . Hypertension Father   . Stroke Father   . Mental illness Sister     . Hypertension Daughter   . Mental illness Daughter   . Hypertension Brother   . Colon cancer Brother   . Breast cancer Sister     ALLERGIES:  is allergic to bee venom.  MEDICATIONS:  Current Facility-Administered Medications  Medication Dose Route Frequency Provider Last Rate Last Dose  . 0.9 %  sodium chloride infusion   Intravenous Continuous Brunetta Genera, MD 20 mL/hr at 01/16/18 0300    . acetaminophen (TYLENOL) tablet 650 mg  650 mg Oral Q4H PRN Brunetta Genera, MD      . allopurinol (ZYLOPRIM) tablet 100 mg  100 mg Oral BID Brunetta Genera, MD   100 mg at 01/16/18 2130  . alteplase (CATHFLO ACTIVASE) injection 2 mg  2 mg Intracatheter Once PRN Brunetta Genera, MD      . alum & mag hydroxide-simeth (MAALOX/MYLANTA) 200-200-20 MG/5ML suspension 60 mL  60 mL Oral Q4H PRN Brunetta Genera, MD   60 mL at 01/16/18 2152  . amLODipine (NORVASC) tablet 5 mg  5 mg Oral Daily Brunetta Genera, MD   5 mg at 01/16/18 0835  . aspirin EC tablet 81 mg  81 mg Oral Daily Brunetta Genera, MD   81 mg at 01/16/18 5176  . Cold Pack 1 packet  1 packet Topical Once PRN Brunetta Genera, MD      . DOXOrubicin (ADRIAMYCIN) 26 mg, etoposide (VEPESID) 132 mg, vinCRIStine (ONCOVIN) 1.1 mg in sodium chloride 0.9 % 1,000 mL chemo infusion   Intravenous Once Brunetta Genera, MD 51 mL/hr at 01/16/18 1501    . enoxaparin (LOVENOX) injection 40 mg  40 mg Subcutaneous Q24H Brunetta Genera, MD   40 mg at 01/16/18 2130  . guaiFENesin-dextromethorphan (ROBITUSSIN DM) 100-10 MG/5ML syrup 10 mL  10 mL Oral Q4H PRN Brunetta Genera, MD      . heparin lock flush 100 unit/mL  500 Units Intracatheter Once PRN Brunetta Genera, MD      . heparin lock flush 100 unit/mL  250 Units Intracatheter Once PRN Brunetta Genera, MD      . heparin lock flush 100 unit/mL  500 Units Intracatheter Q30 days [name], MD       And  . heparin lock flush 100 unit/mL  500  Units Intracatheter PRN [name], MD      . Hot Pack 1 packet  1 packet Topical Once PRN Brunetta Genera, MD      .  hydrocortisone (ANUSOL-HC) 2.5 % rectal cream 1 application  1 application Rectal BID PRN Brunetta Genera, MD      . lisinopril (PRINIVIL,ZESTRIL) tablet 20 mg  20 mg Oral Daily Brunetta Genera, MD   20 mg at 01/16/18 0836  . ondansetron (ZOFRAN) tablet 4-8 mg  4-8 mg Oral Q8H PRN Brunetta Genera, MD       Or  . ondansetron (ZOFRAN-ODT) disintegrating tablet 4-8 mg  4-8 mg Oral Q8H PRN Brunetta Genera, MD       Or  . ondansetron [employer]) injection 4 mg  4 mg Intravenous Q8H PRN Brunetta Genera, MD       Or  . ondansetron (ZOFRAN) 8 mg in sodium chloride 0.9 % 50 mL IVPB  8 mg Intravenous Q8H PRN Brunetta Genera, MD      . predniSONE (DELTASONE) tablet 60 mg  60 mg Oral QAC breakfast Brunetta Genera, MD   60 mg at 01/16/18 0835  . senna-docusate (Senokot-S) tablet 1 tablet  1 tablet Oral QHS PRN Brunetta Genera, MD      . sodium chloride flush (NS) 0.9 % injection 10 mL  10 mL Intracatheter PRN Brunetta Genera, MD      . sodium chloride flush (NS) 0.9 % injection 3 mL  3 mL Intracatheter PRN Brunetta Genera, MD      . traMADol Veatrice Bourbon) tablet 100 mg  100 mg Oral Q6H PRN Brunetta Genera, MD        REVIEW OF SYSTEMS:    10 Point review of Systems was done is negative except as noted above.  PHYSICAL EXAMINATION: ECOG PERFORMANCE STATUS: 1 - Symptomatic but completely ambulatory  . Vitals:   01/16/18 0528 01/16/18 1539  BP: (!) 123/97 (!) 148/81  Pulse: 75 63  Resp: 18 18  Temp: 98 F (36.7 C) 98 F (36.7 C)  SpO2: 93% 100%   There were no vitals filed for this visit. .There is no height or weight on file to calculate BMI.  GENERAL:alert, in no acute distress and comfortable SKIN: no acute rashes, no significant lesions EYES: conjunctiva are pink and non-injected, sclera anicteric OROPHARYNX: MMM,  no exudates, no oropharyngeal erythema or ulceration NECK: supple, no JVD LYMPH:  no palpable lymphadenopathy in the cervical, axillary or inguinal regions LUNGS: clear to auscultation b/l with normal respiratory effort HEART: regular rate & rhythm ABDOMEN:  normoactive bowel sounds , non tender, not distended. Extremity: no pedal edema PSYCH: alert & oriented x 3 with fluent speech NEURO: no focal motor/sensory deficits  LABORATORY DATA:  I have reviewed the data as listed  . CBC Latest Ref Rng & Units 01/16/2018 01/15/2018 01/04/2018  WBC 4.0 - 10.5 K/uL 10.4 7.8 5.4  Hemoglobin 13.0 - 17.0 g/dL 12.4(L) 12.8(L) 12.7(L)  Hematocrit 39.0 - 52.0 % 38.1(L) 39.3 40.2  Platelets 150 - 400 K/uL 274 305 236    . CMP Latest Ref Rng & Units 01/15/2018 01/04/2018  Glucose 70 - 99 mg/dL 157(H) 148(H)  BUN 8 - 23 mg/dL 18 13  Creatinine 0.61 - 1.24 mg/dL 1.07 1.00  Sodium 135 - 145 mmol/L 144 139  Potassium 3.5 - 5.1 mmol/L 4.3 3.5  Chloride 98 - 111 mmol/L 108 104  CO2 22 - 32 mmol/L 26 26  Calcium 8.9 - 10.3 mg/dL 10.1 9.9  Total Protein 6.5 - 8.1 g/dL 7.3 -  Total Bilirubin 0.3 - 1.2 mg/dL 0.8 -  Alkaline Phos 38 -  126 U/L 61 -  AST 15 - 41 U/L 21 -  ALT 0 - 44 U/L 18 -   Component     Latest Ref Rng & Units 01/15/2018  Prothrombin Time     11.4 - 15.2 seconds 12.7  INR      0.96  Magnesium     1.7 - 2.4 mg/dL 2.0  Phosphorus     2.5 - 4.6 mg/dL 3.7  LDH     98 - 192 U/L 170  Uric Acid, Serum     3.7 - 8.6 mg/dL 6.3   RADIOGRAPHIC STUDIES: I have personally reviewed the radiological images as listed and agreed with the findings in the report. Ct Abdomen Pelvis W Contrast  Result Date: 12/22/2017 CLINICAL DATA:  Patient with lower abdominal and groin pain. EXAM: CT ABDOMEN AND PELVIS WITH CONTRAST TECHNIQUE: Multidetector CT imaging of the abdomen and pelvis was performed using the standard protocol following bolus administration of intravenous contrast. CONTRAST:  114m  ISOVUE-300 IOPAMIDOL (ISOVUE-300) INJECTION 61% COMPARISON:  CT abdomen pelvis 02/12/2010. FINDINGS: Lower chest: Normal heart size. Dependent atelectasis bilateral lower lobes. No pleural effusion. Hepatobiliary: Liver is normal in size and contour. No focal hepatic lesions identified. Gallbladder is decompressed. No intrahepatic or extrahepatic biliary ductal dilatation. Pancreas: Unremarkable Spleen: Unremarkable Adrenals/Urinary Tract: Adrenal glands are normal. Multiple bilateral renal cyst. No hydronephrosis. Urinary bladder is decompressed. Stomach/Bowel: Sigmoid colonic diverticulosis. No CT evidence for acute diverticulitis. Normal appendix. No evidence for small bowel obstruction. Small hiatal hernia. Normal morphology of the stomach. Vascular/Lymphatic: Normal caliber abdominal aorta. Peripheral calcified atherosclerotic plaque. Multiple enlarged retroperitoneal, pelvic and left inguinal lymph nodes are demonstrated. Reference 4.2 cm left periaortic lymph node (image 55; series 2). Reference 13.4 x 6.8 cm left pelvic lymph node (image 74; series 2). Reference 5.2 x 6.1 cm left inguinal lymph node (image 86; series 2). Reproductive: Heterogeneous prostate. Other: Small fat containing right inguinal hernia. Musculoskeletal: Lumbar spine degenerative changes. Heterogeneous attenuation of the ileum bilaterally, unchanged from prior. IMPRESSION: 1. Bulky left inguinal, left hemi pelvic and retroperitoneal adenopathy. Considerations include metastatic disease or lymphoma. Electronically Signed   By: DLovey NewcomerM.D.   On: 12/22/2017 22:00   Nm Pet Image Initial (pi) Skull Base To Thigh  Result Date: 01/07/2018 CLINICAL DATA:  Initial treatment strategy for B-cell lymphoma. EXAM: NUCLEAR MEDICINE PET SKULL BASE TO THIGH TECHNIQUE: 14.9 mCi F-18 FDG was injected intravenously. Full-ring PET imaging was performed from the skull base to thigh after the radiotracer. CT data was obtained and used for attenuation  correction and anatomic localization. Fasting blood glucose: 98 mg/dl COMPARISON:  CT 12/22/2017 FINDINGS: Mediastinal blood pool activity: SUV max 2.17 NECK: No hypermetabolic lymph nodes in the neck. Incidental CT findings: none CHEST: Small hypermetabolic lymph node in the LEFT upper mediastinum adjacent to the LEFT subclavian artery measures only 6 mm but has intense metabolic activity SUV max equal 10.9. No additional mediastinal adenopathy no suspicious pulmonary nodularity. Incidental CT findings: none ABDOMEN/PELVIS: Intense hypermetabolic adenopathy in the porta hepatis. The nodes are mildly enlarged at 15 mm but with intense metabolic activity (SUV max equal 27.2). Hypermetabolic adenopathy in the retroperitoneum LEFT of the aorta measures 3.5 cm with SUV max equal 36.9. Adenopathy extends along the LEFT iliac vessels. Massively enlarged LEFT external iliac lymph node with some central necrosis measures 8.0 x 12.5 cm and with SUV max equal 36.7. Enlarged LEFT inguinal lymph node with SUV max equal 38. No abnormal activity in the  liver. The spleen is normal size without metabolic activity. No abnormal activity in the bowel. The bladder is displaced rightward by the a LEFT pelvic adenopathy. Incidental CT findings: Multiple benign-appearing cysts within the kidneys SKELETON: There several discrete metabolic foci within the skeleton without clear CT correlation. For example lesion in the LEFT sacral ala with SUV max equal 12.3. Lesion in theupper thoracic spine with SUV max equal 23.9. Lesion inferior body of LEFT scapula with SUV max equal 8.5 Incidental CT findings: none IMPRESSION: 1. Massively enlarged pelvic lymph nodes intense metabolic activity consistent lymphoma. Additional 2. Additional hypermetabolic lymph nodes in the porta hepatis and retroperitoneum LEFT aorta. 3. Solitary hypermetabolic mediastinal lymph node in the upper LEFT mediastinum. 4. Multiple discrete sites of hypermetabolic skeletal  metastasis (approximately 5 sites). 5. Normal spleen. Electronically Signed   By: Suzy Bouchard M.D.   On: 01/07/2018 10:55   Dg Abd 2 Views  Result Date: 12/22/2017 CLINICAL DATA:  Left groin swelling. EXAM: ABDOMEN - 2 VIEW COMPARISON:  CT scan of February 12, 2010. FINDINGS: The bowel gas pattern is normal. There is no evidence of free air. No radio-opaque calculi or other significant radiographic abnormality is seen. IMPRESSION: No evidence of bowel obstruction or ileus. Electronically Signed   By: Marijo Conception, M.D.   On: 12/22/2017 15:16   [place] Core Biopsy (lymph Nodes)  Result Date: 12/26/2017 CLINICAL DATA:  Enlarged left inguinal lymph node. EXAM: ULTRASOUND GUIDED CORE BIOPSY OF LEFT INGUINAL LYMPH NODE MEDICATIONS: 1.0 mg IV Versed; 50 mcg IV Fentanyl Total Moderate Sedation Time: 13 minutes. The patient's level of consciousness and physiologic status were continuously monitored during the procedure by Radiology nursing. PROCEDURE: The procedure, risks, benefits, and alternatives were explained to the patient. Questions regarding the procedure were encouraged and answered. The patient understands and consents to the procedure. A time out was performed prior to initiating the procedure. Ultrasound was performed of the left groin. The left inguinal region was prepped with chlorhexidine in a sterile fashion, and a sterile drape was applied covering the operative field. A sterile gown and sterile gloves were used for the procedure. Local anesthesia was provided with 1% Lidocaine. Under ultrasound guidance, a total of 6 separate 16 gauge core biopsy samples were obtained through different portions of an enlarged left inguinal lymph node. Core biopsy samples were submitted in saline. Additional ultrasound was performed after biopsy. COMPLICATIONS: None. FINDINGS: Large hypoechoic left inguinal lymph node measures approximately 7 cm in maximum diameter by ultrasound. The lymph node was sampled in  several different portions by core biopsy. Solid tissue was obtained. Post biopsy imaging shows no evidence of hemorrhage. IMPRESSION: Ultrasound-guided core biopsy performed of enlarged left inguinal lymph node measuring 7 cm in maximum diameter by ultrasound. Six total 16 gauge core biopsy samples were obtained and submitted in saline. Electronically Signed   By: Aletta Edouard M.D.   On: 12/26/2017 17:06   Ir Imaging Guided Port Insertion  Result Date: 01/15/2018 INDICATION: 62 year old male with diffuse large B-cell lymphoma in need of durable venous access to begin chemotherapy. EXAM: IMPLANTED PORT A CATH PLACEMENT WITH ULTRASOUND AND FLUOROSCOPIC GUIDANCE MEDICATIONS: 2 g Ancef; The antibiotic was administered within an appropriate time interval prior to skin puncture. ANESTHESIA/SEDATION: Versed 2 mg IV; Fentanyl 100 mcg IV; Moderate Sedation Time:  19 minutes The patient was continuously monitored during the procedure by the interventional radiology nurse under my direct supervision. FLUOROSCOPY TIME:  0 minutes, 24 seconds (20 mGy) COMPLICATIONS: None  immediate. PROCEDURE: The right neck and chest was prepped with chlorhexidine, and draped in the usual sterile fashion using maximum barrier technique (cap and mask, sterile gown, sterile gloves, large sterile sheet, hand hygiene and cutaneous antiseptic). Antibiotic prophylaxis was provided with 2g Ancef administered IV one hour prior to skin incision. Local anesthesia was attained by infiltration with 1% lidocaine with epinephrine. Ultrasound demonstrated patency of the right internal jugular vein, and this was documented with an image. Under real-time ultrasound guidance, this vein was accessed with a 21 gauge micropuncture needle and image documentation was performed. A small dermatotomy was made at the access site with an 11 scalpel. A 0.018" wire was advanced into the SVC and the access needle exchanged for a 69F micropuncture vascular sheath. The  0.018" wire was then removed and a 0.035" wire advanced into the IVC. An appropriate location for the subcutaneous reservoir was selected below the clavicle and an incision was made through the skin and underlying soft tissues. The subcutaneous tissues were then dissected using a combination of blunt and sharp surgical technique and a pocket was formed. A single lumen power injectable portacatheter was then tunneled through the subcutaneous tissues from the pocket to the dermatotomy and the port reservoir placed within the subcutaneous pocket. The venous access site was then serially dilated and a peel away vascular sheath placed over the wire. The wire was removed and the port catheter advanced into position under fluoroscopic guidance. The catheter tip is positioned in the superior cavoatrial junction. This was documented with a spot image. The portacatheter was then tested and found to flush and aspirate well. The port was flushed with saline followed by 100 units/mL heparinized saline. The pocket was then closed in two layers using first subdermal inverted interrupted absorbable sutures followed by a running subcuticular suture. The epidermis was then sealed with Dermabond. The dermatotomy at the venous access site was also closed with Dermabond. IMPRESSION: Successful placement of a right IJ approach Power Port with ultrasound and fluoroscopic guidance. The catheter is ready for use. Electronically Signed   By: [name] M.D.   On: 01/15/2018 16:49    ASSESSMENT & PLAN:   62 y.o. 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  CT of the abdomen and pelvis performed on 12/22/2017 showed bulky left inguinal, left hemipelvic, and  retroperitoneal adenopathy.    01/02/18 Left inguinal LN Biopsy  revealed T-Cell/histocyte rich Large B-Cell Lymphoma  12/27/17 ECHO revealed LV EF of 55-60%   01/05/18 PET/CT revealed Massively enlarged pelvic lymph nodes intense metabolic activity consistent lymphoma. 2. Additional hypermetabolic lymph nodes in the porta hepatis and retroperitoneum LEFT aorta. 3. Solitary hypermetabolic mediastinal lymph node in the upper LEFT Mediastinum. 4. 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 5. Normal spleen.    HIV non reactive on 12/22/2017.  Hep C and hep B serology negative.  2) left lower extremity swelling-stable  Doppler ultrasound for DVT was negative in the left lower extremity.   Likely from venous compression +/- lymphatic obstruction from bulky left inguinal, left hemipelvic, and  retroperitoneal adenopathy.   3) Intermediate to high risk for tumor lysis syndrome.  4) S/p Port a cath placement today PLAN: -Labs done today and evaluated in stable.  Normal uric acid blood counts and chemistries. -Patient got a Port-A-Cath placement without any concerns today.  Appreciate help from interventional radiology. -Patient appropriate to proceed with cycle 1 of EPOCH-R.  -Rituxan on Day 5 -We will plan to do outpatient Rituxan after the first cycle -Neulasta as outpatient on 01/22/2018. -We will monitor labs daily for blood counts chemistries and tumor lysis syndrome concerns. -Patient has been on allopurinol level continue allopurinol at current dose 100 mg p.o. twice daily -We will watch for increasing leg swelling and fluid overload.  4) Smoker and previous h/o cocaine use --The patient was counseled on smoking cessation.  The patient was advised to cut back on his alcohol intake and to stop using cocaine.  5) DVT prophylaxis -lovenox, SCD, ambulation.  6) HTN -Continue outpatient antihypertensives and monitor   All of the patients questions were answered with apparent satisfaction. The patient knows to  call the clinic with any problems, questions or concerns.  The total time spent in the appt was 70 minutes and more than 50% was on counseling and direct patient cares.    Sullivan Lone MD MS AAHIVMS [facility] Hematology/Oncology Physician [facility]  (Office):       [phone] (Work cell):  [phone] (Fax):           [phone]  01/16/2018 10:44 PM  I, Baldwin [place], am acting as a scribe for Dr. Irene Limbo  .I have reviewed the above documentation for accuracy and completeness, and I agree with the above. Sullivan Lone MD MS

## 2018-01-16 NOTE — Progress Notes (Signed)
HEMATOLOGY/ONCOLOGY INPATIENT PROGRESS NOTE  Date of Service: 01/16/2018  Inpatient Attending: .Brunetta Genera, MD   SUBJECTIVE:   Jesus [name]  reports that he is doing well overall and has been tolerating C1 of EPOCH-R very well. He notes that his leg swelling has slightly decreased and denies any difficulty breathing. He notes that he has moved his bowels well and denies any nausea, vomiting, and diarrhea.   The pt reports that he has no new concerns.   Lab results today (01/16/18) of CBC, CMP is as follows: all values are WNL except for RBC at 4.15, HGB at 12.4, HCT at 38.1, Glucose at 192. 01/16/18 Uric acid is WNL at 5.4 01/16/18 LDH at 151  On review of systems, pt reports good energy levels, moving his bowels, reduced leg swelling, urinating frequently, and denies SOB, CP, nausea, vomiting, diarrhea, blood in the stools, headaches, and any other symptoms.    OBJECTIVE:  NAD PHYSICAL EXAMINATION: . Vitals:   01/15/18 1620 01/15/18 1625 01/15/18 2128 01/16/18 0528  BP: 132/82 119/81 135/86 (!) 123/97  Pulse: 81 72 67 75  Resp: 15 16 17 18   Temp:   98.7 F (37.1 C) 98 F (36.7 C)  TempSrc:   Oral Oral  SpO2: 93% 94% 97% 93%   There were no vitals filed for this visit. .There is no height or weight on file to calculate BMI.  GENERAL:alert, in no acute distress and comfortable SKIN: skin color, texture, turgor are normal, no rashes or significant lesions EYES: normal, conjunctiva are pink and non-injected, sclera clear OROPHARYNX:no exudate, no erythema and lips, buccal mucosa, and tongue normal  NECK: supple, no JVD, thyroid normal size, non-tender, without nodularity LYMPH:  no palpable lymphadenopathy in the cervical, axillary or inguinal LUNGS: clear to auscultation with normal respiratory effort HEART: regular rate & rhythm,  no murmurs and no lower extremity edema ABDOMEN: abdomen soft, non-tender, normoactive bowel sounds  Musculoskeletal: no cyanosis  of digits and no clubbing  PSYCH: alert & oriented x 3 with fluent speech NEURO: no focal motor/sensory deficits  MEDICAL HISTORY:  Past Medical History:  Diagnosis Date  . Allergy   . History of kidney stones   . Hyperlipidemia   . Hypertension   . Lymphadenopathy   . Pain, lower leg    Bilateral  . Peripheral arterial disease ([place])   . Pre-diabetes   . Red-green color blindness   . Snores   . Wears glasses     SURGICAL HISTORY: Past Surgical History:  Procedure Laterality Date  . CATARACT EXTRACTION W/ INTRAOCULAR LENS  IMPLANT, BILATERAL    . COLONOSCOPY    . dislodged salava stone    . FRACTURE SURGERY    . HAND ARTHROPLASTY  1995   crushed left hand  . INGUINAL LYMPH NODE BIOPSY Left 01/02/2018   Procedure: LEFT INGUINAL LYMPH NODE BIOPSY;  Surgeon: Rolm Bookbinder, MD;  Location: [facility];  Service: General;  Laterality: Left;  . IR IMAGING GUIDED PORT INSERTION  01/15/2018  . MICROLARYNGOSCOPY Left 01/17/2014   Procedure: MICROLARYNGOSCOPY WITH EXCISION OF THE BIOPSY OF LEFT VOCAL CORD LESION;  Surgeon: Izora Gala, MD;  Location: [facility];  Service: ENT;  Laterality: Left;  . ORIF FOOT FRACTURE  2005   left    SOCIAL HISTORY: Social History   Socioeconomic History  . Marital status: Divorced    Spouse name: Not on file  . Number of children: 3  . Years of education: Not  on file  . Highest education level: Not on file  Occupational History  . Not on file  Social Needs  . Financial resource strain: Not on file  . Food insecurity:    Worry: Not on file    Inability: Not on file  . Transportation needs:    Medical: Not on file    Non-medical: Not on file  Tobacco Use  . Smoking status: Current Every Day Smoker    Packs/day: 0.50    Years: 36.00    Pack years: 18.00    Types: Cigarettes  . Smokeless tobacco: Never Used  Substance and Sexual Activity  . Alcohol use: Yes    Alcohol/week: 15.0 standard drinks    Types: 10 Cans of beer,  5 Shots of liquor per week    Comment: weekends  . Drug use: Yes    Types: Cocaine    Comment: reports cocaine usage ~2X/ month; last use 12/26/17  . Sexual activity: Not on file  Lifestyle  . Physical activity:    Days per week: Not on file    Minutes per session: Not on file  . Stress: Not on file  Relationships  . Social connections:    Talks on phone: Not on file    Gets together: Not on file    Attends religious service: Not on file    Active member of club or organization: Not on file    Attends meetings of clubs or organizations: Not on file    Relationship status: Not on file  . Intimate partner violence:    Fear of current or ex partner: Not on file    Emotionally abused: Not on file    Physically abused: Not on file    Forced sexual activity: Not on file  Other Topics Concern  . Not on file  Social History Narrative  . Not on file    FAMILY HISTORY: Family History  Problem Relation Age of Onset  . Breast cancer Mother   . Diabetes Father   . Hypertension Father   . Stroke Father   . Mental illness Sister   . Hypertension Daughter   . Mental illness Daughter   . Hypertension Brother   . Colon cancer Brother   . Breast cancer Sister     ALLERGIES:  is allergic to bee venom.  MEDICATIONS:  Scheduled Meds: . allopurinol  100 mg Oral BID  . amLODipine  5 mg Oral Daily  . aspirin EC  81 mg Oral Daily  . DOXOrubicin/vinCRIStine/etoposide CHEMO IV infusion for Inpatient CI   Intravenous Once  . DOXOrubicin/vinCRIStine/etoposide CHEMO IV infusion for Inpatient CI   Intravenous Once  . enoxaparin (LOVENOX) injection  40 mg Subcutaneous Q24H  . heparin lock flush  500 Units Intracatheter Q30 days  . lisinopril  20 mg Oral Daily  . predniSONE  60 mg Oral QAC breakfast   Continuous Infusions: . sodium chloride 20 mL/hr at 01/16/18 0300  . ondansetron (ZOFRAN) IV    . ondansetron (ZOFRAN) with dexamethasone (DECADRON) IV     PRN Meds:.acetaminophen,  alteplase, alum & mag hydroxide-simeth, Cold Pack, guaiFENesin-dextromethorphan, heparin lock flush, heparin lock flush, heparin lock flush **AND** heparin lock flush, Hot Pack, hydrocortisone, ondansetron **OR** ondansetron **OR** ondansetron (ZOFRAN) IV **OR** ondansetron (ZOFRAN) IV, senna-docusate, sodium chloride flush, sodium chloride flush, traMADol  REVIEW OF SYSTEMS:    10 Point review of Systems was done is negative except as noted above.   LABORATORY DATA:  I have reviewed the  data as listed  . CBC Latest Ref Rng & Units 01/16/2018 01/15/2018 01/04/2018  WBC 4.0 - 10.5 K/uL 10.4 7.8 5.4  Hemoglobin 13.0 - 17.0 g/dL 12.4(L) 12.8(L) 12.7(L)  Hematocrit 39.0 - 52.0 % 38.1(L) 39.3 40.2  Platelets 150 - 400 K/uL 274 305 236    . CMP Latest Ref Rng & Units 01/16/2018 01/15/2018 01/04/2018  Glucose 70 - 99 mg/dL 192(H) 157(H) 148(H)  BUN 8 - 23 mg/dL 23 18 13   Creatinine 0.61 - 1.24 mg/dL 1.04 1.07 1.00  Sodium 135 - 145 mmol/L 142 144 139  Potassium 3.5 - 5.1 mmol/L 4.1 4.3 3.5  Chloride 98 - 111 mmol/L 106 108 104  CO2 22 - 32 mmol/L 28 26 26   Calcium 8.9 - 10.3 mg/dL 9.6 10.1 9.9  Total Protein 6.5 - 8.1 g/dL 6.6 7.3 -  Total Bilirubin 0.3 - 1.2 mg/dL 0.7 0.8 -  Alkaline Phos 38 - 126 U/L 53 61 -  AST 15 - 41 U/L 19 21 -  ALT 0 - 44 U/L 17 18 -   Component     Latest Ref Rng & Units 01/15/2018 01/16/2018  Uric Acid, Serum     3.7 - 8.6 mg/dL 6.3 5.4  LDH     98 - 192 U/L  151  Phosphorus     2.5 - 4.6 mg/dL  4.1    RADIOGRAPHIC STUDIES: I have personally reviewed the radiological images as listed and agreed with the findings in the report. Ct Abdomen Pelvis W Contrast  Result Date: 12/22/2017 CLINICAL DATA:  Patient with lower abdominal and groin pain. EXAM: CT ABDOMEN AND PELVIS WITH CONTRAST TECHNIQUE: Multidetector CT imaging of the abdomen and pelvis was performed using the standard protocol following bolus administration of intravenous contrast. CONTRAST:  148m  ISOVUE-300 IOPAMIDOL (ISOVUE-300) INJECTION 61% COMPARISON:  CT abdomen pelvis 02/12/2010. FINDINGS: Lower chest: Normal heart size. Dependent atelectasis bilateral lower lobes. No pleural effusion. Hepatobiliary: Liver is normal in size and contour. No focal hepatic lesions identified. Gallbladder is decompressed. No intrahepatic or extrahepatic biliary ductal dilatation. Pancreas: Unremarkable Spleen: Unremarkable Adrenals/Urinary Tract: Adrenal glands are normal. Multiple bilateral renal cyst. No hydronephrosis. Urinary bladder is decompressed. Stomach/Bowel: Sigmoid colonic diverticulosis. No CT evidence for acute diverticulitis. Normal appendix. No evidence for small bowel obstruction. Small hiatal hernia. Normal morphology of the stomach. Vascular/Lymphatic: Normal caliber abdominal aorta. Peripheral calcified atherosclerotic plaque. Multiple enlarged retroperitoneal, pelvic and left inguinal lymph nodes are demonstrated. Reference 4.2 cm left periaortic lymph node (image 55; series 2). Reference 13.4 x 6.8 cm left pelvic lymph node (image 74; series 2). Reference 5.2 x 6.1 cm left inguinal lymph node (image 86; series 2). Reproductive: Heterogeneous prostate. Other: Small fat containing right inguinal hernia. Musculoskeletal: Lumbar spine degenerative changes. Heterogeneous attenuation of the ileum bilaterally, unchanged from prior. IMPRESSION: 1. Bulky left inguinal, left hemi pelvic and retroperitoneal adenopathy. Considerations include metastatic disease or lymphoma. Electronically Signed   By: DLovey NewcomerM.D.   On: 12/22/2017 22:00   Nm Pet Image Initial (pi) Skull Base To Thigh  Result Date: 01/07/2018 CLINICAL DATA:  Initial treatment strategy for B-cell lymphoma. EXAM: NUCLEAR MEDICINE PET SKULL BASE TO THIGH TECHNIQUE: 14.9 mCi F-18 FDG was injected intravenously. Full-ring PET imaging was performed from the skull base to thigh after the radiotracer. CT data was obtained and used for attenuation  correction and anatomic localization. Fasting blood glucose: 98 mg/dl COMPARISON:  CT 12/22/2017 FINDINGS: Mediastinal blood pool activity: SUV max 2.17 NECK: No  hypermetabolic lymph nodes in the neck. Incidental CT findings: none CHEST: Small hypermetabolic lymph node in the LEFT upper mediastinum adjacent to the LEFT subclavian artery measures only 6 mm but has intense metabolic activity SUV max equal 10.9. No additional mediastinal adenopathy no suspicious pulmonary nodularity. Incidental CT findings: none ABDOMEN/PELVIS: Intense hypermetabolic adenopathy in the porta hepatis. The nodes are mildly enlarged at 15 mm but with intense metabolic activity (SUV max equal 27.2). Hypermetabolic adenopathy in the retroperitoneum LEFT of the aorta measures 3.5 cm with SUV max equal 36.9. Adenopathy extends along the LEFT iliac vessels. Massively enlarged LEFT external iliac lymph node with some central necrosis measures 8.0 x 12.5 cm and with SUV max equal 36.7. Enlarged LEFT inguinal lymph node with SUV max equal 38. No abnormal activity in the liver. The spleen is normal size without metabolic activity. No abnormal activity in the bowel. The bladder is displaced rightward by the a LEFT pelvic adenopathy. Incidental CT findings: Multiple benign-appearing cysts within the kidneys SKELETON: There several discrete metabolic foci within the skeleton without clear CT correlation. For example lesion in the LEFT sacral ala with SUV max equal 12.3. Lesion in theupper thoracic spine with SUV max equal 23.9. Lesion inferior body of LEFT scapula with SUV max equal 8.5 Incidental CT findings: none IMPRESSION: 1. Massively enlarged pelvic lymph nodes intense metabolic activity consistent lymphoma. Additional 2. Additional hypermetabolic lymph nodes in the porta hepatis and retroperitoneum LEFT aorta. 3. Solitary hypermetabolic mediastinal lymph node in the upper LEFT mediastinum. 4. Multiple discrete sites of hypermetabolic skeletal  metastasis (approximately 5 sites). 5. Normal spleen. Electronically Signed   By: Suzy Bouchard M.D.   On: 01/07/2018 10:55   Dg Abd 2 Views  Result Date: 12/22/2017 CLINICAL DATA:  Left groin swelling. EXAM: ABDOMEN - 2 VIEW COMPARISON:  CT scan of February 12, 2010. FINDINGS: The bowel gas pattern is normal. There is no evidence of free air. No radio-opaque calculi or other significant radiographic abnormality is seen. IMPRESSION: No evidence of bowel obstruction or ileus. Electronically Signed   By: Marijo Conception, M.D.   On: 12/22/2017 15:16   [place] Core Biopsy (lymph Nodes)  Result Date: 12/26/2017 CLINICAL DATA:  Enlarged left inguinal lymph node. EXAM: ULTRASOUND GUIDED CORE BIOPSY OF LEFT INGUINAL LYMPH NODE MEDICATIONS: 1.0 mg IV Versed; 50 mcg IV Fentanyl Total Moderate Sedation Time: 13 minutes. The patient's level of consciousness and physiologic status were continuously monitored during the procedure by Radiology nursing. PROCEDURE: The procedure, risks, benefits, and alternatives were explained to the patient. Questions regarding the procedure were encouraged and answered. The patient understands and consents to the procedure. A time out was performed prior to initiating the procedure. Ultrasound was performed of the left groin. The left inguinal region was prepped with chlorhexidine in a sterile fashion, and a sterile drape was applied covering the operative field. A sterile gown and sterile gloves were used for the procedure. Local anesthesia was provided with 1% Lidocaine. Under ultrasound guidance, a total of 6 separate 16 gauge core biopsy samples were obtained through different portions of an enlarged left inguinal lymph node. Core biopsy samples were submitted in saline. Additional ultrasound was performed after biopsy. COMPLICATIONS: None. FINDINGS: Large hypoechoic left inguinal lymph node measures approximately 7 cm in maximum diameter by ultrasound. The lymph node was sampled in  several different portions by core biopsy. Solid tissue was obtained. Post biopsy imaging shows no evidence of hemorrhage. IMPRESSION: Ultrasound-guided core biopsy performed of enlarged left  inguinal lymph node measuring 7 cm in maximum diameter by ultrasound. Six total 16 gauge core biopsy samples were obtained and submitted in saline. Electronically Signed   By: Aletta Edouard M.D.   On: 12/26/2017 17:06   Ir Imaging Guided Port Insertion  Result Date: 01/15/2018 INDICATION: 62 year old male with diffuse large B-cell lymphoma in need of durable venous access to begin chemotherapy. EXAM: IMPLANTED PORT A CATH PLACEMENT WITH ULTRASOUND AND FLUOROSCOPIC GUIDANCE MEDICATIONS: 2 g Ancef; The antibiotic was administered within an appropriate time interval prior to skin puncture. ANESTHESIA/SEDATION: Versed 2 mg IV; Fentanyl 100 mcg IV; Moderate Sedation Time:  19 minutes The patient was continuously monitored during the procedure by the interventional radiology nurse under my direct supervision. FLUOROSCOPY TIME:  0 minutes, 24 seconds (20 mGy) COMPLICATIONS: None immediate. PROCEDURE: The right neck and chest was prepped with chlorhexidine, and draped in the usual sterile fashion using maximum barrier technique (cap and mask, sterile gown, sterile gloves, large sterile sheet, hand hygiene and cutaneous antiseptic). Antibiotic prophylaxis was provided with 2g Ancef administered IV one hour prior to skin incision. Local anesthesia was attained by infiltration with 1% lidocaine with epinephrine. Ultrasound demonstrated patency of the right internal jugular vein, and this was documented with an image. Under real-time ultrasound guidance, this vein was accessed with a 21 gauge micropuncture needle and image documentation was performed. A small dermatotomy was made at the access site with an 11 scalpel. A 0.018" wire was advanced into the SVC and the access needle exchanged for a 36F micropuncture vascular sheath. The  0.018" wire was then removed and a 0.035" wire advanced into the IVC. An appropriate location for the subcutaneous reservoir was selected below the clavicle and an incision was made through the skin and underlying soft tissues. The subcutaneous tissues were then dissected using a combination of blunt and sharp surgical technique and a pocket was formed. A single lumen power injectable portacatheter was then tunneled through the subcutaneous tissues from the pocket to the dermatotomy and the port reservoir placed within the subcutaneous pocket. The venous access site was then serially dilated and a peel away vascular sheath placed over the wire. The wire was removed and the port catheter advanced into position under fluoroscopic guidance. The catheter tip is positioned in the superior cavoatrial junction. This was documented with a spot image. The portacatheter was then tested and found to flush and aspirate well. The port was flushed with saline followed by 100 units/mL heparinized saline. The pocket was then closed in two layers using first subdermal inverted interrupted absorbable sutures followed by a running subcuticular suture. The epidermis was then sealed with Dermabond. The dermatotomy at the venous access site was also closed with Dermabond. IMPRESSION: Successful placement of a right IJ approach Power Port with ultrasound and fluoroscopic guidance. The catheter is ready for use. Electronically Signed   By: [name] M.D.   On: 01/15/2018 16:49    ASSESSMENT & PLAN:  62 y.o. 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  CT of the abdomen and pelvis performed on 12/22/2017 showed bulky left inguinal, left hemipelvic, and retroperitoneal adenopathy.   01/02/18 Left inguinal LN Biopsy  revealed T-Cell/histocyte rich Large B-Cell Lymphoma  12/27/17 ECHO revealed LV EF of 55-60%   01/05/18 PET/CT revealedMassively enlarged pelvic lymph nodes intense metabolic activity consistent lymphoma. 2. Additional hypermetabolic  lymph nodes in the porta hepatis and retroperitoneum LEFT aorta. 3. Solitary hypermetabolic mediastinal lymph node in the upper LEFT Mediastinum. 4. 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 5. Normal spleen.   HIV non reactive on 12/22/2017.  Hep C and hep B serology negative.  2) left lower extremity swelling-stable/slightly improved Doppler ultrasound for DVT was negative in the left lower extremity.  Likely from venous compression +/- lymphatic obstruction from bulky left inguinal, left hemipelvic, and retroperitoneal adenopathy.   3) Intermediate to high risk for tumor lysis syndrome.  4) S/p Port a cath placement today PLAN:  -Discussed pt labwork today, 01/16/18; blood counts and chemistries are stable. LDH and Uric acid is normal -The pt has no prohibitive toxicities from continuing C1 of EPOCH-R at this time.  -We will monitor labs daily for blood counts chemistries and tumor lysis syndrome concerns. -Patient has been on allopurinol level continue allopurinol at current dose 100 mg p.o. twice daily -We will watch for increasing leg swelling and fluid overload. -Patient got a Port-A-Cath placement without any concerns today.  Appreciate help from interventional radiology. -Rituxan on Day 5 -We will plan to do outpatient Rituxan after the first cycle -Neulasta as outpatient on 01/22/2018.4) Smoker and previous h/o cocaine use --The patient was counseled on smoking cessation. The patient was advised to cut back on his alcohol intake and to stop using cocaine.  5) DVT prophylaxis -lovenox, SCD, ambulation.  6) HTN -Continue outpatient antihypertensives and monitor   The total time spent in the  appt was 25 minutes and more than 50% was on counseling and direct patient cares.    Sullivan Lone MD MS AAHIVMS [facility] Hematology/Oncology Physician [facility]  (Office):       [phone] (Work cell):  [phone] (Fax):           [phone]  01/16/2018 2:01 PM  I, Baldwin [place], am acting as a scribe for Dr. Irene Limbo  .I have reviewed the above documentation for accuracy and completeness, and I agree with the above. Sullivan Lone MD MS

## 2018-01-17 LAB — COMPREHENSIVE METABOLIC PANEL
AST: 17 U/L (ref 15–41)
Alkaline Phosphatase: 49 U/L (ref 38–126)
Anion gap: 9 (ref 5–15)
BUN: 20 mg/dL (ref 8–23)
CO2: 27 mmol/L (ref 22–32)
Calcium: 9.1 mg/dL (ref 8.9–10.3)
GFR calc Af Amer: >60 mL/min (ref >60)
Potassium: 4 mmol/L (ref 3.5–5.1)
Sodium: 140 mmol/L (ref 135–145)
Total Bilirubin: 0.8 mg/dL (ref 0.3–1.2)
Total Protein: 6.4 g/dL — ABNORMAL LOW (ref 6.5–8.1)

## 2018-01-17 LAB — COMPREHENSIVE METABOLIC PANEL WITH GFR
ALT: 16 U/L (ref 0–44)
Albumin: 3.7 g/dL (ref 3.5–5.0)
Chloride: 104 mmol/L (ref 98–111)
Creatinine, Ser: 0.89 mg/dL (ref 0.61–1.24)
GFR calc non Af Amer: >60 mL/min (ref >60)
Glucose, Bld: 149 mg/dL — ABNORMAL HIGH (ref 70–99)

## 2018-01-17 LAB — CBC
HCT: 38.6 % — ABNORMAL LOW (ref 39.0–52.0)
Hemoglobin: 12.3 g/dL — ABNORMAL LOW (ref 13.0–17.0)
MCH: 29.1 pg (ref 26.0–34.0)
MCHC: 31.9 g/dL (ref 30.0–36.0)
MCV: 91.3 fL (ref 78.0–100.0)
Platelets: 269 10*3/uL (ref 150–400)
RBC: 4.23 MIL/uL (ref 4.22–5.81)
RDW: 13.7 % (ref 11.5–15.5)
WBC: 9.7 10*3/uL (ref 4.0–10.5)

## 2018-01-17 LAB — LACTATE DEHYDROGENASE: LDH: 135 U/L (ref 98–192)

## 2018-01-17 LAB — URIC ACID: Uric Acid, Serum: 5.4 mg/dL (ref 3.7–8.6)

## 2018-01-17 MED ORDER — SODIUM CHLORIDE 0.9 % IV SOLN
Freq: Once | INTRAVENOUS | Status: AC
  Administered 2018-01-17: 8 mg via INTRAVENOUS
  Filled 2018-01-17: qty 4

## 2018-01-17 MED ORDER — VINCRISTINE SULFATE CHEMO INJECTION 1 MG/ML
Freq: Once | INTRAVENOUS | Status: AC
  Administered 2018-01-17: 13:00:00 via INTRAVENOUS
  Filled 2018-01-17: qty 13

## 2018-01-17 NOTE — Progress Notes (Signed)
Disability paperwork successfully faxed to [employer] at [phone], also faxed to [employer] at [phone]. Copy taken to Ms. Wilma at front desk for pick up. Message left on patient cell phone informing paperwork is ready for pick up as requested.

## 2018-01-17 NOTE — Progress Notes (Signed)
HEMATOLOGY/ONCOLOGY INPATIENT PROGRESS NOTE  Date of Service: 01/17/2018  Inpatient Attending: .Brunetta Genera, MD   SUBJECTIVE:   Jesus [name] is accompanied today by his daughter. [name].   The pt reports that he had some blood in the stools today, noting blood on the toilet paper but is not sure if blood was in the toilet. The pt denies any abdominal pains today. The pt also notes that he has had good energy levels. He denies any constitutional symptoms.   Lab results today (01/17/18) of CBC and CMP is as follows: all values are WNL except for HGB at 12.3, HCT at 38.6, Glucose at 149, Total Protein at 6.4. 01/17/18 Uric acid is WNL at 5.4 01/17/18 LDH is WNL at 135  On review of systems, pt reports stable energy levels, stable leg swelling, blood on toilet paper, and denies abdominal pains, difficulty breathing, nausea, vomiting, diarrhea, and any other symptoms.   OBJECTIVE:  NAD  PHYSICAL EXAMINATION: . Vitals:   01/16/18 0528 01/16/18 1539 01/17/18 0547 01/17/18 1456  BP: (!) 123/97 (!) 148/81 125/86 124/76  Pulse: 75 63 (!) 54 64  Resp: 18 18 20 18   Temp: 98 F (36.7 C) 98 F (36.7 C) 97.8 F (36.6 C) 97.8 F (36.6 C)  TempSrc: Oral Oral Oral Oral  SpO2: 93% 100% 98% 96%   There were no vitals filed for this visit. .There is no height or weight on file to calculate BMI.  GENERAL:alert, in no acute distress and comfortable SKIN: no acute rashes, no significant lesions EYES: conjunctiva are pink and non-injected, sclera anicteric OROPHARYNX: MMM, no exudates, no oropharyngeal erythema or ulceration NECK: supple, no JVD LYMPH:  no palpable lymphadenopathy in the cervical, axillary or inguinal regions LUNGS: clear to auscultation b/l with normal respiratory effort HEART: regular rate & rhythm ABDOMEN:  normoactive bowel sounds , non tender, not distended. No palpable hepatosplenomegaly.  Extremity: no pedal  edema PSYCH: alert & oriented x 3 with fluent speech NEURO: no focal motor/sensory deficits   MEDICAL HISTORY:  Past Medical History:  Diagnosis Date  . Allergy   . History of kidney stones   . Hyperlipidemia   . Hypertension   . Lymphadenopathy   . Pain, lower leg    Bilateral  . Peripheral arterial disease ([place])   . Pre-diabetes   . Red-green color blindness   . Snores   . Wears glasses     SURGICAL HISTORY: Past Surgical History:  Procedure Laterality Date  . CATARACT EXTRACTION W/ INTRAOCULAR LENS  IMPLANT, BILATERAL    . COLONOSCOPY    . dislodged salava stone    . FRACTURE SURGERY    . HAND ARTHROPLASTY  1995   crushed left hand  . INGUINAL LYMPH NODE BIOPSY Left 01/02/2018   Procedure: LEFT INGUINAL LYMPH NODE BIOPSY;  Surgeon: Rolm Bookbinder, MD;  Location: [facility];  Service: General;  Laterality: Left;  . IR IMAGING GUIDED PORT INSERTION  01/15/2018  . MICROLARYNGOSCOPY Left 01/17/2014   Procedure: MICROLARYNGOSCOPY WITH EXCISION OF THE BIOPSY OF LEFT VOCAL CORD LESION;  Surgeon: Izora Gala, MD;  Location: [facility];  Service: ENT;  Laterality: Left;  . ORIF FOOT FRACTURE  2005   left    SOCIAL HISTORY: Social History   Socioeconomic History  . Marital status: Divorced    Spouse name: Not on file  . Number of children: 3  . Years of education: Not on  file  . Highest education level: Not on file  Occupational History  . Not on file  Social Needs  . Financial resource strain: Not on file  . Food insecurity:    Worry: Not on file    Inability: Not on file  . Transportation needs:    Medical: Not on file    Non-medical: Not on file  Tobacco Use  . Smoking status: Current Every Day Smoker    Packs/day: 0.50    Years: 36.00    Pack years: 18.00    Types: Cigarettes  . Smokeless tobacco: Never Used  Substance and Sexual Activity  . Alcohol use: Yes    Alcohol/week: 15.0 standard drinks    Types: 10 Cans of beer, 5 Shots of liquor  per week    Comment: weekends  . Drug use: Yes    Types: Cocaine    Comment: reports cocaine usage ~2X/ month; last use 12/26/17  . Sexual activity: Not on file  Lifestyle  . Physical activity:    Days per week: Not on file    Minutes per session: Not on file  . Stress: Not on file  Relationships  . Social connections:    Talks on phone: Not on file    Gets together: Not on file    Attends religious service: Not on file    Active member of club or organization: Not on file    Attends meetings of clubs or organizations: Not on file    Relationship status: Not on file  . Intimate partner violence:    Fear of current or ex partner: Not on file    Emotionally abused: Not on file    Physically abused: Not on file    Forced sexual activity: Not on file  Other Topics Concern  . Not on file  Social History Narrative  . Not on file    FAMILY HISTORY: Family History  Problem Relation Age of Onset  . Breast cancer Mother   . Diabetes Father   . Hypertension Father   . Stroke Father   . Mental illness Sister   . Hypertension Daughter   . Mental illness Daughter   . Hypertension Brother   . Colon cancer Brother   . Breast cancer Sister     ALLERGIES:  is allergic to bee venom.  MEDICATIONS:  Scheduled Meds: . allopurinol  100 mg Oral BID  . amLODipine  5 mg Oral Daily  . aspirin EC  81 mg Oral Daily  . DOXOrubicin/vinCRIStine/etoposide CHEMO IV infusion for Inpatient CI   Intravenous Once  . enoxaparin (LOVENOX) injection  40 mg Subcutaneous Q24H  . heparin lock flush  500 Units Intracatheter Q30 days  . lisinopril  20 mg Oral Daily  . predniSONE  60 mg Oral QAC breakfast   Continuous Infusions: . sodium chloride 20 mL/hr at 01/17/18 1527  . ondansetron (ZOFRAN) IV     PRN Meds:.acetaminophen, alteplase, alum & mag hydroxide-simeth, Cold Pack, guaiFENesin-dextromethorphan, heparin lock flush, heparin lock flush, heparin lock flush **AND** heparin lock flush, Hot Pack,  hydrocortisone, ondansetron **OR** ondansetron **OR** ondansetron (ZOFRAN) IV **OR** ondansetron (ZOFRAN) IV, senna-docusate, sodium chloride flush, sodium chloride flush, traMADol  REVIEW OF SYSTEMS:    A 10+ POINT REVIEW OF SYSTEMS WAS OBTAINED including neurology, dermatology, psychiatry, cardiac, respiratory, lymph, extremities, GI, GU, Musculoskeletal, constitutional, breasts, reproductive, HEENT.  All pertinent positives are noted in the HPI.  All others are negative.   LABORATORY DATA:  I have reviewed the  data as listed  . CBC Latest Ref Rng & Units 01/17/2018 01/16/2018 01/15/2018  WBC 4.0 - 10.5 K/uL 9.7 10.4 7.8  Hemoglobin 13.0 - 17.0 g/dL 12.3(L) 12.4(L) 12.8(L)  Hematocrit 39.0 - 52.0 % 38.6(L) 38.1(L) 39.3  Platelets 150 - 400 K/uL 269 274 305    . CMP Latest Ref Rng & Units 01/17/2018 01/16/2018 01/15/2018  Glucose 70 - 99 mg/dL 149(H) 192(H) 157(H)  BUN 8 - 23 mg/dL 20 23 18   Creatinine 0.61 - 1.24 mg/dL 0.89 1.04 1.07  Sodium 135 - 145 mmol/L 140 142 144  Potassium 3.5 - 5.1 mmol/L 4.0 4.1 4.3  Chloride 98 - 111 mmol/L 104 106 108  CO2 22 - 32 mmol/L 27 28 26   Calcium 8.9 - 10.3 mg/dL 9.1 9.6 10.1  Total Protein 6.5 - 8.1 g/dL 6.4(L) 6.6 7.3  Total Bilirubin 0.3 - 1.2 mg/dL 0.8 0.7 0.8  Alkaline Phos 38 - 126 U/L 49 53 61  AST 15 - 41 U/L 17 19 21   ALT 0 - 44 U/L 16 17 18      RADIOGRAPHIC STUDIES: I have personally reviewed the radiological images as listed and agreed with the findings in the report. Ct Abdomen Pelvis W Contrast  Result Date: 12/22/2017 CLINICAL DATA:  Patient with lower abdominal and groin pain. EXAM: CT ABDOMEN AND PELVIS WITH CONTRAST TECHNIQUE: Multidetector CT imaging of the abdomen and pelvis was performed using the standard protocol following bolus administration of intravenous contrast. CONTRAST:  187m ISOVUE-300 IOPAMIDOL (ISOVUE-300) INJECTION 61% COMPARISON:  CT abdomen pelvis 02/12/2010. FINDINGS: Lower chest: Normal heart size. Dependent  atelectasis bilateral lower lobes. No pleural effusion. Hepatobiliary: Liver is normal in size and contour. No focal hepatic lesions identified. Gallbladder is decompressed. No intrahepatic or extrahepatic biliary ductal dilatation. Pancreas: Unremarkable Spleen: Unremarkable Adrenals/Urinary Tract: Adrenal glands are normal. Multiple bilateral renal cyst. No hydronephrosis. Urinary bladder is decompressed. Stomach/Bowel: Sigmoid colonic diverticulosis. No CT evidence for acute diverticulitis. Normal appendix. No evidence for small bowel obstruction. Small hiatal hernia. Normal morphology of the stomach. Vascular/Lymphatic: Normal caliber abdominal aorta. Peripheral calcified atherosclerotic plaque. Multiple enlarged retroperitoneal, pelvic and left inguinal lymph nodes are demonstrated. Reference 4.2 cm left periaortic lymph node (image 55; series 2). Reference 13.4 x 6.8 cm left pelvic lymph node (image 74; series 2). Reference 5.2 x 6.1 cm left inguinal lymph node (image 86; series 2). Reproductive: Heterogeneous prostate. Other: Small fat containing right inguinal hernia. Musculoskeletal: Lumbar spine degenerative changes. Heterogeneous attenuation of the ileum bilaterally, unchanged from prior. IMPRESSION: 1. Bulky left inguinal, left hemi pelvic and retroperitoneal adenopathy. Considerations include metastatic disease or lymphoma. Electronically Signed   By: DLovey NewcomerM.D.   On: 12/22/2017 22:00   Nm Pet Image Initial (pi) Skull Base To Thigh  Result Date: 01/07/2018 CLINICAL DATA:  Initial treatment strategy for B-cell lymphoma. EXAM: NUCLEAR MEDICINE PET SKULL BASE TO THIGH TECHNIQUE: 14.9 mCi F-18 FDG was injected intravenously. Full-ring PET imaging was performed from the skull base to thigh after the radiotracer. CT data was obtained and used for attenuation correction and anatomic localization. Fasting blood glucose: 98 mg/dl COMPARISON:  CT 12/22/2017 FINDINGS: Mediastinal blood pool activity: SUV  max 2.17 NECK: No hypermetabolic lymph nodes in the neck. Incidental CT findings: none CHEST: Small hypermetabolic lymph node in the LEFT upper mediastinum adjacent to the LEFT subclavian artery measures only 6 mm but has intense metabolic activity SUV max equal 10.9. No additional mediastinal adenopathy no suspicious pulmonary nodularity. Incidental CT findings: none  ABDOMEN/PELVIS: Intense hypermetabolic adenopathy in the porta hepatis. The nodes are mildly enlarged at 15 mm but with intense metabolic activity (SUV max equal 27.2). Hypermetabolic adenopathy in the retroperitoneum LEFT of the aorta measures 3.5 cm with SUV max equal 36.9. Adenopathy extends along the LEFT iliac vessels. Massively enlarged LEFT external iliac lymph node with some central necrosis measures 8.0 x 12.5 cm and with SUV max equal 36.7. Enlarged LEFT inguinal lymph node with SUV max equal 38. No abnormal activity in the liver. The spleen is normal size without metabolic activity. No abnormal activity in the bowel. The bladder is displaced rightward by the a LEFT pelvic adenopathy. Incidental CT findings: Multiple benign-appearing cysts within the kidneys SKELETON: There several discrete metabolic foci within the skeleton without clear CT correlation. For example lesion in the LEFT sacral ala with SUV max equal 12.3. Lesion in theupper thoracic spine with SUV max equal 23.9. Lesion inferior body of LEFT scapula with SUV max equal 8.5 Incidental CT findings: none IMPRESSION: 1. Massively enlarged pelvic lymph nodes intense metabolic activity consistent lymphoma. Additional 2. Additional hypermetabolic lymph nodes in the porta hepatis and retroperitoneum LEFT aorta. 3. Solitary hypermetabolic mediastinal lymph node in the upper LEFT mediastinum. 4. 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 5. Normal spleen. Electronically Signed   By: Suzy Bouchard M.D.   On: 01/07/2018 10:55   Dg Abd 2  Views  Result Date: 12/22/2017 CLINICAL DATA:  Left groin swelling. EXAM: ABDOMEN - 2 VIEW COMPARISON:  CT scan of February 12, 2010. FINDINGS: The bowel gas pattern is normal. There is no evidence of free air. No radio-opaque calculi or other significant radiographic abnormality is seen. IMPRESSION: No evidence of bowel obstruction or ileus. Electronically Signed   By: Marijo Conception, M.D.   On: 12/22/2017 15:16   [place] Core Biopsy (lymph Nodes)  Result Date: 12/26/2017 CLINICAL DATA:  Enlarged left inguinal lymph node. EXAM: ULTRASOUND GUIDED CORE BIOPSY OF LEFT INGUINAL LYMPH NODE MEDICATIONS: 1.0 mg IV Versed; 50 mcg IV Fentanyl Total Moderate Sedation Time: 13 minutes. The patient's level of consciousness and physiologic status were continuously monitored during the procedure by Radiology nursing. PROCEDURE: The procedure, risks, benefits, and alternatives were explained to the patient. Questions regarding the procedure were encouraged and answered. The patient understands and consents to the procedure. A time out was performed prior to initiating the procedure. Ultrasound was performed of the left groin. The left inguinal region was prepped with chlorhexidine in a sterile fashion, and a sterile drape was applied covering the operative field. A sterile gown and sterile gloves were used for the procedure. Local anesthesia was provided with 1% Lidocaine. Under ultrasound guidance, a total of 6 separate 16 gauge core biopsy samples were obtained through different portions of an enlarged left inguinal lymph node. Core biopsy samples were submitted in saline. Additional ultrasound was performed after biopsy. COMPLICATIONS: None. FINDINGS: Large hypoechoic left inguinal lymph node measures approximately 7 cm in maximum diameter by ultrasound. The lymph node was sampled in several different portions by core biopsy. Solid tissue was obtained. Post biopsy imaging shows no evidence of hemorrhage. IMPRESSION:  Ultrasound-guided core biopsy performed of enlarged left inguinal lymph node measuring 7 cm in maximum diameter by ultrasound. Six total 16 gauge core biopsy samples were obtained and submitted in saline. Electronically Signed   By: Aletta Edouard M.D.   On: 12/26/2017 17:06   Ir Imaging Guided Port Insertion  Result Date: 01/15/2018 INDICATION: 62 year old  male with diffuse large B-cell lymphoma in need of durable venous access to begin chemotherapy. EXAM: IMPLANTED PORT A CATH PLACEMENT WITH ULTRASOUND AND FLUOROSCOPIC GUIDANCE MEDICATIONS: 2 g Ancef; The antibiotic was administered within an appropriate time interval prior to skin puncture. ANESTHESIA/SEDATION: Versed 2 mg IV; Fentanyl 100 mcg IV; Moderate Sedation Time:  19 minutes The patient was continuously monitored during the procedure by the interventional radiology nurse under my direct supervision. FLUOROSCOPY TIME:  0 minutes, 24 seconds (20 mGy) COMPLICATIONS: None immediate. PROCEDURE: The right neck and chest was prepped with chlorhexidine, and draped in the usual sterile fashion using maximum barrier technique (cap and mask, sterile gown, sterile gloves, large sterile sheet, hand hygiene and cutaneous antiseptic). Antibiotic prophylaxis was provided with 2g Ancef administered IV one hour prior to skin incision. Local anesthesia was attained by infiltration with 1% lidocaine with epinephrine. Ultrasound demonstrated patency of the right internal jugular vein, and this was documented with an image. Under real-time ultrasound guidance, this vein was accessed with a 21 gauge micropuncture needle and image documentation was performed. A small dermatotomy was made at the access site with an 11 scalpel. A 0.018" wire was advanced into the SVC and the access needle exchanged for a 76F micropuncture vascular sheath. The 0.018" wire was then removed and a 0.035" wire advanced into the IVC. An appropriate location for the subcutaneous reservoir was selected  below the clavicle and an incision was made through the skin and underlying soft tissues. The subcutaneous tissues were then dissected using a combination of blunt and sharp surgical technique and a pocket was formed. A single lumen power injectable portacatheter was then tunneled through the subcutaneous tissues from the pocket to the dermatotomy and the port reservoir placed within the subcutaneous pocket. The venous access site was then serially dilated and a peel away vascular sheath placed over the wire. The wire was removed and the port catheter advanced into position under fluoroscopic guidance. The catheter tip is positioned in the superior cavoatrial junction. This was documented with a spot image. The portacatheter was then tested and found to flush and aspirate well. The port was flushed with saline followed by 100 units/mL heparinized saline. The pocket was then closed in two layers using first subdermal inverted interrupted absorbable sutures followed by a running subcuticular suture. The epidermis was then sealed with Dermabond. The dermatotomy at the venous access site was also closed with Dermabond. IMPRESSION: Successful placement of a right IJ approach Power Port with ultrasound and fluoroscopic guidance. The catheter is ready for use. Electronically Signed   By: [name] M.D.   On: 01/15/2018 16:49    ASSESSMENT & PLAN:  62 y.o. 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  CT of the abdomen and pelvis performed on 12/22/2017 showed bulky left inguinal, left hemipelvic, and retroperitoneal adenopathy.   01/02/18 Left inguinal LN Biopsy revealed T-Cell/histocyte rich Large B-Cell Lymphoma  12/27/17 ECHO revealed LV EF of 55-60%   01/05/18 PET/CT revealedMassively  enlarged pelvic lymph nodes intense metabolic activity consistent lymphoma. 2. Additional hypermetabolic lymph nodes in the porta hepatis and retroperitoneum LEFT aorta. 3. Solitary hypermetabolic mediastinal lymph node in the upper LEFT Mediastinum. 4. 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 5. Normal spleen.   HIV non reactive on 12/22/2017.  Hep C and hep B serology negative.  2) left lower extremity swelling-stable Doppler ultrasound for DVT was negative in the left lower extremity.  Likely from venous compression +/- lymphatic obstruction from bulky left inguinal, left hemipelvic, and retroperitoneal adenopathy.   3) Intermediate to high risk for tumor lysis syndrome.  4) S/p Port a cath placement today PLAN:  -Discussed pt labwork today, 01/17/18; blood counts and chemistries are stable . No overt evidence of significant tumor lysis. -The pt has no prohibitive toxicities from continuing C1 of EPOCH-R at this time.   -Continues using SCDs for leg swelling -Encouraged the pt to ambulate periodically to prevent blood clots as well  -Rituxan on Day 5 -We will plan to do outpatient Rituxan after the first cycle -Neulasta as outpatient on 01/22/2018. -Patient has been on allopurinol level continue allopurinol at current dose 100 mg p.o. twice daily -We will watch for increasing leg swelling and fluid overload.  5) DVT prophylaxis -lovenox, SCD, ambulation.  6) HTN -Continue outpatient antihypertensives and monitor   The total time spent in the appt was 25 minutes and more than 50% was on counseling and direct patient cares.    Sullivan Lone MD MS AAHIVMS [facility] Hematology/Oncology Physician [facility]  (Office):       [phone] (Work cell):  [phone] (Fax):           [phone]  01/17/2018 3:48 PM  I, Baldwin [place], am acting as a scribe for Dr. Irene Limbo  .I have reviewed the above documentation for  accuracy and completeness, and I agree with the above. Sullivan Lone MD MS

## 2018-01-18 ENCOUNTER — Encounter: Payer: Self-pay | Admitting: Hematology

## 2018-01-18 LAB — COMPREHENSIVE METABOLIC PANEL
AST: 17 U/L (ref 15–41)
Albumin: 3.4 g/dL — ABNORMAL LOW (ref 3.5–5.0)
BUN: 19 mg/dL (ref 8–23)
Calcium: 9 mg/dL (ref 8.9–10.3)
Chloride: 106 mmol/L (ref 98–111)
Potassium: 3.7 mmol/L (ref 3.5–5.1)
Total Bilirubin: 0.8 mg/dL (ref 0.3–1.2)
Total Protein: 5.9 g/dL — ABNORMAL LOW (ref 6.5–8.1)

## 2018-01-18 LAB — COMPREHENSIVE METABOLIC PANEL WITH GFR
ALT: 17 U/L (ref 0–44)
Alkaline Phosphatase: 40 U/L (ref 38–126)
Anion gap: 8 (ref 5–15)
CO2: 28 mmol/L (ref 22–32)
Creatinine, Ser: 0.91 mg/dL (ref 0.61–1.24)
GFR calc Af Amer: >60 mL/min (ref >60)
GFR calc non Af Amer: >60 mL/min (ref >60)
Glucose, Bld: 130 mg/dL — ABNORMAL HIGH (ref 70–99)
Sodium: 142 mmol/L (ref 135–145)

## 2018-01-18 LAB — CBC
HCT: 37.4 % — ABNORMAL LOW (ref 39.0–52.0)
Hemoglobin: 12.3 g/dL — ABNORMAL LOW (ref 13.0–17.0)
MCH: 29.6 pg (ref 26.0–34.0)
MCHC: 32.9 g/dL (ref 30.0–36.0)
MCV: 89.9 fL (ref 78.0–100.0)
Platelets: 264 10*3/uL (ref 150–400)
RBC: 4.16 MIL/uL — ABNORMAL LOW (ref 4.22–5.81)
RDW: 13.2 % (ref 11.5–15.5)
WBC: 6.1 10*3/uL (ref 4.0–10.5)

## 2018-01-18 LAB — URIC ACID: Uric Acid, Serum: 5.7 mg/dL (ref 3.7–8.6)

## 2018-01-18 LAB — LACTATE DEHYDROGENASE: LDH: 126 U/L (ref 98–192)

## 2018-01-18 MED ORDER — VINCRISTINE SULFATE CHEMO INJECTION 1 MG/ML
Freq: Once | INTRAVENOUS | Status: AC
  Administered 2018-01-18: 12:00:00 via INTRAVENOUS
  Filled 2018-01-18: qty 13

## 2018-01-18 MED ORDER — SODIUM CHLORIDE 0.9 % IV SOLN
Freq: Once | INTRAVENOUS | Status: AC
  Administered 2018-01-18: 18 mg via INTRAVENOUS
  Filled 2018-01-18: qty 4

## 2018-01-18 NOTE — Progress Notes (Signed)
HEMATOLOGY/ONCOLOGY INPATIENT PROGRESS NOTE  Date of Service: 01/18/2018  Inpatient Attending: .Brunetta Genera, MD   SUBJECTIVE:   Jesus [name] is accompanied today by his daughter. [name].   The pt reports that he has been using more hot sauce which he notes has made his stools red in the past. He notes that he believes there is blood in the stools and on his toilet paper. He notes that he has been eating well and notes that his leg swelling has continued to decrease each day. He notes that he has been walking more today and that his leg pain has alleviated. He notes that his energy levels have been good and that he continues to tolerate EPOCH without concern.   Lab results today (01/18/18) of CBC and CMP is as follows: all values are WNL except for RBC at 4.16, HGB at 12.3, HCT at 37.4, Glucose at 130, Total Protein at 5.9, Albumin at 3.4. 01/18/18 Uric acid at 5.7  On review of systems, pt reports good energy levels, eating well, staying active, blood in the stools, solid stools, breathing well, reduced leg swelling, and denies nausea, vomiting, diarrhea, abdominal pains, and any other symptoms.    OBJECTIVE:  NAD  PHYSICAL EXAMINATION: . Vitals:   01/17/18 1742 01/17/18 2142 01/18/18 0643 01/18/18 1326  BP:  (!) 141/86 132/84 (!) 152/81  Pulse:  60 (!) 50 70  Resp:  20 16 16   Temp:  98 F (36.7 C) 98 F (36.7 C) 98.1 F (36.7 C)  TempSrc:  Oral Oral Oral  SpO2:  98% 96% 95%  Weight: 295 lb 3.1 oz (133.9 kg)     Height: 6' 1"  (1.854 m)      Filed Weights   01/17/18 1742  Weight: 295 lb 3.1 oz (133.9 kg)   .Body mass index is 38.95 kg/m.  GENERAL:alert, in no acute distress and comfortable SKIN: no acute rashes, no significant lesions EYES: conjunctiva are pink and non-injected, sclera anicteric OROPHARYNX: MMM, no exudates, no oropharyngeal erythema or ulceration NECK: supple, no JVD LYMPH:  no palpable  lymphadenopathy in the cervical, axillary or inguinal regions LUNGS: clear to auscultation b/l with normal respiratory effort HEART: regular rate & rhythm ABDOMEN:  normoactive bowel sounds , non tender, not distended. No palpable hepatosplenomegaly.  Extremity: no pedal edema PSYCH: alert & oriented x 3 with fluent speech NEURO: no focal motor/sensory deficits   MEDICAL HISTORY:  Past Medical History:  Diagnosis Date  . Allergy   . History of kidney stones   . Hyperlipidemia   . Hypertension   . Lymphadenopathy   . Pain, lower leg    Bilateral  . Peripheral arterial disease ([place])   . Pre-diabetes   . Red-green color blindness   . Snores   . Wears glasses     SURGICAL HISTORY: Past Surgical History:  Procedure Laterality Date  . CATARACT EXTRACTION W/ INTRAOCULAR LENS  IMPLANT, BILATERAL    . COLONOSCOPY    . dislodged salava stone    . FRACTURE SURGERY    . HAND ARTHROPLASTY  1995   crushed left hand  . INGUINAL LYMPH NODE BIOPSY Left 01/02/2018   Procedure: LEFT INGUINAL LYMPH NODE BIOPSY;  Surgeon: Rolm Bookbinder, MD;  Location: [facility];  Service: General;  Laterality: Left;  . IR IMAGING GUIDED PORT INSERTION  01/15/2018  . MICROLARYNGOSCOPY Left 01/17/2014   Procedure: MICROLARYNGOSCOPY WITH EXCISION OF THE BIOPSY OF LEFT VOCAL  CORD LESION;  Surgeon: Izora Gala, MD;  Location: [facility];  Service: ENT;  Laterality: Left;  . ORIF FOOT FRACTURE  2005   left    SOCIAL HISTORY: Social History   Socioeconomic History  . Marital status: Divorced    Spouse name: Not on file  . Number of children: 3  . Years of education: Not on file  . Highest education level: Not on file  Occupational History  . Not on file  Social Needs  . Financial resource strain: Not on file  . Food insecurity:    Worry: Not on file    Inability: Not on file  . Transportation needs:    Medical: Not on file    Non-medical: Not on file  Tobacco Use  . Smoking status:  Current Every Day Smoker    Packs/day: 0.50    Years: 36.00    Pack years: 18.00    Types: Cigarettes  . Smokeless tobacco: Never Used  Substance and Sexual Activity  . Alcohol use: Yes    Alcohol/week: 15.0 standard drinks    Types: 10 Cans of beer, 5 Shots of liquor per week    Comment: weekends  . Drug use: Yes    Types: Cocaine    Comment: reports cocaine usage ~2X/ month; last use 12/26/17  . Sexual activity: Not on file  Lifestyle  . Physical activity:    Days per week: Not on file    Minutes per session: Not on file  . Stress: Not on file  Relationships  . Social connections:    Talks on phone: Not on file    Gets together: Not on file    Attends religious service: Not on file    Active member of club or organization: Not on file    Attends meetings of clubs or organizations: Not on file    Relationship status: Not on file  . Intimate partner violence:    Fear of current or ex partner: Not on file    Emotionally abused: Not on file    Physically abused: Not on file    Forced sexual activity: Not on file  Other Topics Concern  . Not on file  Social History Narrative  . Not on file    FAMILY HISTORY: Family History  Problem Relation Age of Onset  . Breast cancer Mother   . Diabetes Father   . Hypertension Father   . Stroke Father   . Mental illness Sister   . Hypertension Daughter   . Mental illness Daughter   . Hypertension Brother   . Colon cancer Brother   . Breast cancer Sister     ALLERGIES:  is allergic to bee venom.  MEDICATIONS:  Scheduled Meds: . allopurinol  100 mg Oral BID  . amLODipine  5 mg Oral Daily  . aspirin EC  81 mg Oral Daily  . enoxaparin (LOVENOX) injection  40 mg Subcutaneous Q24H  . heparin lock flush  500 Units Intracatheter Q30 days  . lisinopril  20 mg Oral Daily  . predniSONE  60 mg Oral QAC breakfast   Continuous Infusions: . sodium chloride 20 mL/hr at 01/17/18 1944  . ondansetron (ZOFRAN) IV     PRN  Meds:.acetaminophen, alteplase, alum & mag hydroxide-simeth, Cold Pack, guaiFENesin-dextromethorphan, heparin lock flush, heparin lock flush, heparin lock flush **AND** heparin lock flush, Hot Pack, hydrocortisone, ondansetron **OR** ondansetron **OR** ondansetron (ZOFRAN) IV **OR** ondansetron (ZOFRAN) IV, senna-docusate, sodium chloride flush, sodium chloride flush, traMADol  REVIEW OF SYSTEMS:    A 10+ POINT REVIEW OF SYSTEMS WAS OBTAINED including neurology, dermatology, psychiatry, cardiac, respiratory, lymph, extremities, GI, GU, Musculoskeletal, constitutional, breasts, reproductive, HEENT.  All pertinent positives are noted in the HPI.  All others are negative.   LABORATORY DATA:  I have reviewed the data as listed  . CBC Latest Ref Rng & Units 01/18/2018 01/17/2018 01/16/2018  WBC 4.0 - 10.5 K/uL 6.1 9.7 10.4  Hemoglobin 13.0 - 17.0 g/dL 12.3(L) 12.3(L) 12.4(L)  Hematocrit 39.0 - 52.0 % 37.4(L) 38.6(L) 38.1(L)  Platelets 150 - 400 K/uL 264 269 274    . CMP Latest Ref Rng & Units 01/18/2018 01/17/2018 01/16/2018  Glucose 70 - 99 mg/dL 130(H) 149(H) 192(H)  BUN 8 - 23 mg/dL 19 20 23   Creatinine 0.61 - 1.24 mg/dL 0.91 0.89 1.04  Sodium 135 - 145 mmol/L 142 140 142  Potassium 3.5 - 5.1 mmol/L 3.7 4.0 4.1  Chloride 98 - 111 mmol/L 106 104 106  CO2 22 - 32 mmol/L 28 27 28   Calcium 8.9 - 10.3 mg/dL 9.0 9.1 9.6  Total Protein 6.5 - 8.1 g/dL 5.9(L) 6.4(L) 6.6  Total Bilirubin 0.3 - 1.2 mg/dL 0.8 0.8 0.7  Alkaline Phos 38 - 126 U/L 40 49 53  AST 15 - 41 U/L 17 17 19   ALT 0 - 44 U/L 17 16 17      RADIOGRAPHIC STUDIES: I have personally reviewed the radiological images as listed and agreed with the findings in the report. Ct Abdomen Pelvis W Contrast  Result Date: 12/22/2017 CLINICAL DATA:  Patient with lower abdominal and groin pain. EXAM: CT ABDOMEN AND PELVIS WITH CONTRAST TECHNIQUE: Multidetector CT imaging of the abdomen and pelvis was performed using the standard protocol following  bolus administration of intravenous contrast. CONTRAST:  110m ISOVUE-300 IOPAMIDOL (ISOVUE-300) INJECTION 61% COMPARISON:  CT abdomen pelvis 02/12/2010. FINDINGS: Lower chest: Normal heart size. Dependent atelectasis bilateral lower lobes. No pleural effusion. Hepatobiliary: Liver is normal in size and contour. No focal hepatic lesions identified. Gallbladder is decompressed. No intrahepatic or extrahepatic biliary ductal dilatation. Pancreas: Unremarkable Spleen: Unremarkable Adrenals/Urinary Tract: Adrenal glands are normal. Multiple bilateral renal cyst. No hydronephrosis. Urinary bladder is decompressed. Stomach/Bowel: Sigmoid colonic diverticulosis. No CT evidence for acute diverticulitis. Normal appendix. No evidence for small bowel obstruction. Small hiatal hernia. Normal morphology of the stomach. Vascular/Lymphatic: Normal caliber abdominal aorta. Peripheral calcified atherosclerotic plaque. Multiple enlarged retroperitoneal, pelvic and left inguinal lymph nodes are demonstrated. Reference 4.2 cm left periaortic lymph node (image 55; series 2). Reference 13.4 x 6.8 cm left pelvic lymph node (image 74; series 2). Reference 5.2 x 6.1 cm left inguinal lymph node (image 86; series 2). Reproductive: Heterogeneous prostate. Other: Small fat containing right inguinal hernia. Musculoskeletal: Lumbar spine degenerative changes. Heterogeneous attenuation of the ileum bilaterally, unchanged from prior. IMPRESSION: 1. Bulky left inguinal, left hemi pelvic and retroperitoneal adenopathy. Considerations include metastatic disease or lymphoma. Electronically Signed   By: DLovey NewcomerM.D.   On: 12/22/2017 22:00   Nm Pet Image Initial (pi) Skull Base To Thigh  Result Date: 01/07/2018 CLINICAL DATA:  Initial treatment strategy for B-cell lymphoma. EXAM: NUCLEAR MEDICINE PET SKULL BASE TO THIGH TECHNIQUE: 14.9 mCi F-18 FDG was injected intravenously. Full-ring PET imaging was performed from the skull base to thigh after  the radiotracer. CT data was obtained and used for attenuation correction and anatomic localization. Fasting blood glucose: 98 mg/dl COMPARISON:  CT 12/22/2017 FINDINGS: Mediastinal blood pool activity: SUV max 2.17 NECK:  No hypermetabolic lymph nodes in the neck. Incidental CT findings: none CHEST: Small hypermetabolic lymph node in the LEFT upper mediastinum adjacent to the LEFT subclavian artery measures only 6 mm but has intense metabolic activity SUV max equal 10.9. No additional mediastinal adenopathy no suspicious pulmonary nodularity. Incidental CT findings: none ABDOMEN/PELVIS: Intense hypermetabolic adenopathy in the porta hepatis. The nodes are mildly enlarged at 15 mm but with intense metabolic activity (SUV max equal 27.2). Hypermetabolic adenopathy in the retroperitoneum LEFT of the aorta measures 3.5 cm with SUV max equal 36.9. Adenopathy extends along the LEFT iliac vessels. Massively enlarged LEFT external iliac lymph node with some central necrosis measures 8.0 x 12.5 cm and with SUV max equal 36.7. Enlarged LEFT inguinal lymph node with SUV max equal 38. No abnormal activity in the liver. The spleen is normal size without metabolic activity. No abnormal activity in the bowel. The bladder is displaced rightward by the a LEFT pelvic adenopathy. Incidental CT findings: Multiple benign-appearing cysts within the kidneys SKELETON: There several discrete metabolic foci within the skeleton without clear CT correlation. For example lesion in the LEFT sacral ala with SUV max equal 12.3. Lesion in theupper thoracic spine with SUV max equal 23.9. Lesion inferior body of LEFT scapula with SUV max equal 8.5 Incidental CT findings: none IMPRESSION: 1. Massively enlarged pelvic lymph nodes intense metabolic activity consistent lymphoma. Additional 2. Additional hypermetabolic lymph nodes in the porta hepatis and retroperitoneum LEFT aorta. 3. Solitary hypermetabolic mediastinal lymph node in the upper LEFT  mediastinum. 4. 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 5. Normal spleen. Electronically Signed   By: Suzy Bouchard M.D.   On: 01/07/2018 10:55   Dg Abd 2 Views  Result Date: 12/22/2017 CLINICAL DATA:  Left groin swelling. EXAM: ABDOMEN - 2 VIEW COMPARISON:  CT scan of February 12, 2010. FINDINGS: The bowel gas pattern is normal. There is no evidence of free air. No radio-opaque calculi or other significant radiographic abnormality is seen. IMPRESSION: No evidence of bowel obstruction or ileus. Electronically Signed   By: Marijo Conception, M.D.   On: 12/22/2017 15:16   [place] Core Biopsy (lymph Nodes)  Result Date: 12/26/2017 CLINICAL DATA:  Enlarged left inguinal lymph node. EXAM: ULTRASOUND GUIDED CORE BIOPSY OF LEFT INGUINAL LYMPH NODE MEDICATIONS: 1.0 mg IV Versed; 50 mcg IV Fentanyl Total Moderate Sedation Time: 13 minutes. The patient's level of consciousness and physiologic status were continuously monitored during the procedure by Radiology nursing. PROCEDURE: The procedure, risks, benefits, and alternatives were explained to the patient. Questions regarding the procedure were encouraged and answered. The patient understands and consents to the procedure. A time out was performed prior to initiating the procedure. Ultrasound was performed of the left groin. The left inguinal region was prepped with chlorhexidine in a sterile fashion, and a sterile drape was applied covering the operative field. A sterile gown and sterile gloves were used for the procedure. Local anesthesia was provided with 1% Lidocaine. Under ultrasound guidance, a total of 6 separate 16 gauge core biopsy samples were obtained through different portions of an enlarged left inguinal lymph node. Core biopsy samples were submitted in saline. Additional ultrasound was performed after biopsy. COMPLICATIONS: None. FINDINGS: Large hypoechoic left inguinal lymph node measures approximately 7 cm in  maximum diameter by ultrasound. The lymph node was sampled in several different portions by core biopsy. Solid tissue was obtained. Post biopsy imaging shows no evidence of hemorrhage. IMPRESSION: Ultrasound-guided core biopsy performed of enlarged  left inguinal lymph node measuring 7 cm in maximum diameter by ultrasound. Six total 16 gauge core biopsy samples were obtained and submitted in saline. Electronically Signed   By: Aletta Edouard M.D.   On: 12/26/2017 17:06   Ir Imaging Guided Port Insertion  Result Date: 01/15/2018 INDICATION: 62 year old male with diffuse large B-cell lymphoma in need of durable venous access to begin chemotherapy. EXAM: IMPLANTED PORT A CATH PLACEMENT WITH ULTRASOUND AND FLUOROSCOPIC GUIDANCE MEDICATIONS: 2 g Ancef; The antibiotic was administered within an appropriate time interval prior to skin puncture. ANESTHESIA/SEDATION: Versed 2 mg IV; Fentanyl 100 mcg IV; Moderate Sedation Time:  19 minutes The patient was continuously monitored during the procedure by the interventional radiology nurse under my direct supervision. FLUOROSCOPY TIME:  0 minutes, 24 seconds (20 mGy) COMPLICATIONS: None immediate. PROCEDURE: The right neck and chest was prepped with chlorhexidine, and draped in the usual sterile fashion using maximum barrier technique (cap and mask, sterile gown, sterile gloves, large sterile sheet, hand hygiene and cutaneous antiseptic). Antibiotic prophylaxis was provided with 2g Ancef administered IV one hour prior to skin incision. Local anesthesia was attained by infiltration with 1% lidocaine with epinephrine. Ultrasound demonstrated patency of the right internal jugular vein, and this was documented with an image. Under real-time ultrasound guidance, this vein was accessed with a 21 gauge micropuncture needle and image documentation was performed. A small dermatotomy was made at the access site with an 11 scalpel. A 0.018" wire was advanced into the SVC and the access  needle exchanged for a 60F micropuncture vascular sheath. The 0.018" wire was then removed and a 0.035" wire advanced into the IVC. An appropriate location for the subcutaneous reservoir was selected below the clavicle and an incision was made through the skin and underlying soft tissues. The subcutaneous tissues were then dissected using a combination of blunt and sharp surgical technique and a pocket was formed. A single lumen power injectable portacatheter was then tunneled through the subcutaneous tissues from the pocket to the dermatotomy and the port reservoir placed within the subcutaneous pocket. The venous access site was then serially dilated and a peel away vascular sheath placed over the wire. The wire was removed and the port catheter advanced into position under fluoroscopic guidance. The catheter tip is positioned in the superior cavoatrial junction. This was documented with a spot image. The portacatheter was then tested and found to flush and aspirate well. The port was flushed with saline followed by 100 units/mL heparinized saline. The pocket was then closed in two layers using first subdermal inverted interrupted absorbable sutures followed by a running subcuticular suture. The epidermis was then sealed with Dermabond. The dermatotomy at the venous access site was also closed with Dermabond. IMPRESSION: Successful placement of a right IJ approach Power Port with ultrasound and fluoroscopic guidance. The catheter is ready for use. Electronically Signed   By: [name] M.D.   On: 01/15/2018 16:49    ASSESSMENT & PLAN:  62 y.o. 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  CT of the abdomen and pelvis performed on 12/22/2017 showed bulky left inguinal, left hemipelvic,  and retroperitoneal adenopathy.   01/02/18 Left inguinal LN Biopsy revealed T-Cell/histocyte rich Large B-Cell Lymphoma  12/27/17 ECHO revealed LV EF of 55-60%   01/05/18 PET/CT revealedMassively enlarged pelvic lymph nodes intense metabolic activity consistent lymphoma. 2. Additional  hypermetabolic lymph nodes in the porta hepatis and retroperitoneum LEFT aorta. 3. Solitary hypermetabolic mediastinal lymph node in the upper LEFT Mediastinum. 4. 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 5. Normal spleen.   HIV non reactive on 12/22/2017.  Hep C and hep B serology negative.  2) left lower extremity swelling-stable Doppler ultrasound for DVT was negative in the left lower extremity.  Likely from venous compression +/- lymphatic obstruction from bulky left inguinal, left hemipelvic, and retroperitoneal adenopathy.   3) Intermediate to high risk for tumor lysis syndrome.  4) S/p Port a cath placement today  PLAN:  -Discussed pt labwork today, 01/18/18; blood counts and chemistries are stable. No evidence of TLS with uric acid at 5.7. Normal LDH -The pt has no prohibitive toxicities from continuing C1 EPOCH-R at this time.   -Continue ambulating periodically and using SCDs -Continue eating well and staying hydrated  -Neulasta as outpatient -Rituxan on day 5 as inpatient. Will plan to do this as outpatient from next cycle. -Encouraged the pt to ambulate periodically to prevent blood clots as well  -Neulasta as outpatient on 01/22/2018. -stable uric acid levels -continue allopurinol at current dose 100 mg p.o. twice daily till C2 and will discontinued -We will watch for increasing leg swelling and fluid overload.  5) DVT prophylaxis -lovenox, SCD, ambulation.  6) HTN -Continue outpatient antihypertensives and monitor   7) Mild hemorrhoidal bleeding. hgb stable. Prn anusol  . The total time spent in the appointment was 25  minutes and more than 50% was on counseling and direct patient cares.    Sullivan Lone MD MS AAHIVMS [facility] Hematology/Oncology Physician [facility]  (Office):       [phone] (Work cell):  [phone] (Fax):           [phone]  01/18/2018 3:47 PM  I, Baldwin [place], am acting as a scribe for Dr. Irene Limbo  .I have reviewed the above documentation for accuracy and completeness, and I agree with the above. Sullivan Lone MD MS

## 2018-01-19 ENCOUNTER — Telehealth: Payer: Self-pay | Admitting: Hematology

## 2018-01-19 ENCOUNTER — Encounter: Payer: Self-pay | Admitting: Hematology

## 2018-01-19 LAB — COMPREHENSIVE METABOLIC PANEL
ALT: 18 U/L (ref 0–44)
AST: 15 U/L (ref 15–41)
Albumin: 3.4 g/dL — ABNORMAL LOW (ref 3.5–5.0)
Alkaline Phosphatase: 43 U/L (ref 38–126)
BUN: 19 mg/dL (ref 8–23)
Calcium: 9.2 mg/dL (ref 8.9–10.3)
Creatinine, Ser: 0.91 mg/dL (ref 0.61–1.24)
GFR calc non Af Amer: >60 mL/min (ref >60)
Glucose, Bld: 121 mg/dL — ABNORMAL HIGH (ref 70–99)
Potassium: 3.5 mmol/L (ref 3.5–5.1)
Total Bilirubin: 0.8 mg/dL (ref 0.3–1.2)
Total Protein: 5.9 g/dL — ABNORMAL LOW (ref 6.5–8.1)

## 2018-01-19 LAB — CBC
HCT: 37.7 % — ABNORMAL LOW (ref 39.0–52.0)
Hemoglobin: 12.6 g/dL — ABNORMAL LOW (ref 13.0–17.0)
MCH: 29.5 pg (ref 26.0–34.0)
MCHC: 33.4 g/dL (ref 30.0–36.0)
MCV: 88.3 fL (ref 78.0–100.0)
Platelets: 261 10*3/uL (ref 150–400)
RBC: 4.27 MIL/uL (ref 4.22–5.81)
RDW: 12.8 % (ref 11.5–15.5)
WBC: 4.6 10*3/uL (ref 4.0–10.5)

## 2018-01-19 LAB — LACTATE DEHYDROGENASE: LDH: 127 U/L (ref 98–192)

## 2018-01-19 LAB — COMPREHENSIVE METABOLIC PANEL WITH GFR
Anion gap: 9 (ref 5–15)
CO2: 29 mmol/L (ref 22–32)
Chloride: 104 mmol/L (ref 98–111)
GFR calc Af Amer: >60 mL/min (ref >60)
Sodium: 142 mmol/L (ref 135–145)

## 2018-01-19 LAB — URIC ACID: Uric Acid, Serum: 5.4 mg/dL (ref 3.7–8.6)

## 2018-01-19 MED ORDER — DIPHENHYDRAMINE HCL 50 MG/ML IJ SOLN
50.0000 mg | Freq: Once | INTRAMUSCULAR | Status: DC | PRN
  Filled 2018-01-19: qty 1

## 2018-01-19 MED ORDER — DEXAMETHASONE 4 MG PO TABS: 8.0000 mg | ORAL_TABLET | Freq: Two times a day (BID) | ORAL | 1 refills | Status: DC

## 2018-01-19 MED ORDER — EPINEPHRINE PF 1 MG/ML IJ SOLN
0.5000 mg | Freq: Once | INTRAMUSCULAR | Status: DC | PRN
  Filled 2018-01-19: qty 1

## 2018-01-19 MED ORDER — EPINEPHRINE PF 1 MG/10ML IJ SOSY
0.2500 mg | PREFILLED_SYRINGE | Freq: Once | INTRAMUSCULAR | Status: DC | PRN
  Filled 2018-01-19: qty 10

## 2018-01-19 MED ORDER — FAMOTIDINE IN NACL 20-0.9 MG/50ML-% IV SOLN
20.0000 mg | Freq: Once | INTRAVENOUS | Status: DC | PRN
  Filled 2018-01-19: qty 50

## 2018-01-19 MED ORDER — SODIUM CHLORIDE 0.9 % IV SOLN
375.0000 mg/m2 | Freq: Once | INTRAVENOUS | Status: AC
  Administered 2018-01-19: 1000 mg via INTRAVENOUS
  Filled 2018-01-19: qty 100

## 2018-01-19 MED ORDER — METHYLPREDNISOLONE SODIUM SUCC 125 MG IJ SOLR
125.0000 mg | Freq: Once | INTRAMUSCULAR | Status: DC | PRN
  Filled 2018-01-19: qty 2

## 2018-01-19 MED ORDER — HYDROCORTISONE 2.5 % RE CREA: 1.0000 "application " | TOPICAL_CREAM | Freq: Two times a day (BID) | RECTAL | 0 refills | Status: DC | PRN

## 2018-01-19 MED ORDER — PROCHLORPERAZINE MALEATE 10 MG PO TABS: 10.0000 mg | ORAL_TABLET | Freq: Four times a day (QID) | ORAL | 1 refills | Status: DC | PRN

## 2018-01-19 MED ORDER — ALBUTEROL SULFATE (2.5 MG/3ML) 0.083% IN NEBU: 2.5000 mg | INHALATION_SOLUTION | Freq: Once | RESPIRATORY_TRACT | Status: DC | PRN

## 2018-01-19 MED ORDER — DIPHENHYDRAMINE HCL 50 MG PO CAPS
50.0000 mg | ORAL_CAPSULE | Freq: Once | ORAL | Status: AC
  Administered 2018-01-19: 50 mg via ORAL
  Filled 2018-01-19: qty 1

## 2018-01-19 MED ORDER — EPINEPHRINE PF 1 MG/10ML IJ SOSY: 0.2500 mg | PREFILLED_SYRINGE | Freq: Once | INTRAMUSCULAR | Status: DC | PRN

## 2018-01-19 MED ORDER — SODIUM CHLORIDE 0.9 % IV SOLN
750.0000 mg/m2 | Freq: Once | INTRAVENOUS | Status: AC
  Administered 2018-01-19: 1980 mg via INTRAVENOUS
  Filled 2018-01-19: qty 99

## 2018-01-19 MED ORDER — DIPHENHYDRAMINE HCL 50 MG/ML IJ SOLN
25.0000 mg | Freq: Once | INTRAMUSCULAR | Status: DC | PRN
  Filled 2018-01-19: qty 1

## 2018-01-19 MED ORDER — LORAZEPAM 0.5 MG PO TABS: 0.5000 mg | ORAL_TABLET | Freq: Four times a day (QID) | ORAL | 0 refills | Status: DC | PRN

## 2018-01-19 MED ORDER — ACETAMINOPHEN 325 MG PO TABS
650.0000 mg | ORAL_TABLET | Freq: Once | ORAL | Status: AC
  Administered 2018-01-19: 650 mg via ORAL
  Filled 2018-01-19: qty 2

## 2018-01-19 MED ORDER — SODIUM CHLORIDE 0.9 % IV SOLN: Freq: Once | INTRAVENOUS | Status: DC | PRN

## 2018-01-19 MED ORDER — SODIUM CHLORIDE 0.9 % IV SOLN
Freq: Once | INTRAVENOUS | Status: AC
  Administered 2018-01-19: 16 mg via INTRAVENOUS
  Filled 2018-01-19: qty 8

## 2018-01-19 NOTE — Discharge Summary (Signed)
[place]  [phone]) [phone] [phone]    Physician Discharge Summary     Patient ID: Jesus [name] MRN: [id number] DOB/AGE: Mar 02, 1956 62 y.o.  Admit date: 01/15/2018 Discharge date: 01/19/2018  Primary Care Physician:  Shirline Frees, MD   Discharge Diagnoses:    Present on Admission: . Diffuse large B cell lymphoma [facility])   Discharge Medications:  Allergies as of 01/19/2018      Reactions   Bee Venom Anaphylaxis      Medication List    STOP taking these medications   naproxen 500 MG tablet Commonly known as:  NAPROSYN     TAKE these medications   allopurinol 100 MG tablet Commonly known as:  ZYLOPRIM Take 1 tablet (100 mg total) by mouth 2 (two) times daily.   amLODipine 10 MG tablet Commonly known as:  NORVASC Take 10 mg by mouth daily.   aspirin 81 MG tablet Take 81 mg by mouth daily.   CIALIS 20 MG tablet Generic drug:  tadalafil Take 20 mg by mouth daily as needed for erectile dysfunction.   dexamethasone 4 MG tablet Commonly known as:  DECADRON Take 2 tablets (8 mg total) by mouth 2 (two) times daily with a meal. Take two times a day starting the day after chemotherapy for 3 days. What changed:    how much to take  when to take this  additional instructions   hydrocortisone 2.5 % rectal cream Commonly known as:  ANUSOL-HC Place 1 application rectally 2 (two) times daily as needed for hemorrhoids.   lisinopril 20 MG tablet Commonly known as:  PRINIVIL,ZESTRIL Take 20 mg by mouth daily.   LORazepam 0.5 MG tablet Commonly known as:  ATIVAN Take 1 tablet (0.5 mg total) by mouth every 6 (six) hours as needed (Nausea or vomiting).   multivitamin with minerals tablet Take 1 tablet by mouth daily.   ondansetron 8 MG tablet Commonly known as:  ZOFRAN Take 1 tablet (8 mg total) by mouth every 8 (eight) hours as needed for nausea or vomiting.   prochlorperazine 10 MG tablet Commonly known as:   COMPAZINE Take 1 tablet (10 mg total) by mouth every 6 (six) hours as needed (Nausea or vomiting).   traMADol 50 MG tablet Commonly known as:  ULTRAM Take 2 tablets (100 mg total) by mouth every 6 (six) hours as needed. What changed:  reasons to take this        Disposition and Follow-up:   Significant Diagnostic Studies:  Ct Abdomen Pelvis W Contrast  Result Date: 12/22/2017 CLINICAL DATA:  Patient with lower abdominal and groin pain. EXAM: CT ABDOMEN AND PELVIS WITH CONTRAST TECHNIQUE: Multidetector CT imaging of the abdomen and pelvis was performed using the standard protocol following bolus administration of intravenous contrast. CONTRAST:  128m ISOVUE-300 IOPAMIDOL (ISOVUE-300) INJECTION 61% COMPARISON:  CT abdomen pelvis 02/12/2010. FINDINGS: Lower chest: Normal heart size. Dependent atelectasis bilateral lower lobes. No pleural effusion. Hepatobiliary: Liver is normal in size and contour. No focal hepatic lesions identified. Gallbladder is decompressed. No intrahepatic or extrahepatic biliary ductal dilatation. Pancreas: Unremarkable Spleen: Unremarkable Adrenals/Urinary Tract: Adrenal glands are normal. Multiple bilateral renal cyst. No hydronephrosis. Urinary bladder is decompressed. Stomach/Bowel: Sigmoid colonic diverticulosis. No CT evidence for acute diverticulitis. Normal appendix. No evidence for small bowel obstruction. Small hiatal hernia. Normal morphology of the stomach. Vascular/Lymphatic: Normal caliber abdominal aorta. Peripheral calcified atherosclerotic plaque. Multiple enlarged retroperitoneal, pelvic and left inguinal lymph nodes are demonstrated. Reference 4.2 cm  left periaortic lymph node (image 55; series 2). Reference 13.4 x 6.8 cm left pelvic lymph node (image 74; series 2). Reference 5.2 x 6.1 cm left inguinal lymph node (image 86; series 2). Reproductive: Heterogeneous prostate. Other: Small fat containing right inguinal hernia. Musculoskeletal: Lumbar spine  degenerative changes. Heterogeneous attenuation of the ileum bilaterally, unchanged from prior. IMPRESSION: 1. Bulky left inguinal, left hemi pelvic and retroperitoneal adenopathy. Considerations include metastatic disease or lymphoma. Electronically Signed   By: Lovey Newcomer M.D.   On: 12/22/2017 22:00   Nm Pet Image Initial (pi) Skull Base To Thigh  Result Date: 01/07/2018 CLINICAL DATA:  Initial treatment strategy for B-cell lymphoma. EXAM: NUCLEAR MEDICINE PET SKULL BASE TO THIGH TECHNIQUE: 14.9 mCi F-18 FDG was injected intravenously. Full-ring PET imaging was performed from the skull base to thigh after the radiotracer. CT data was obtained and used for attenuation correction and anatomic localization. Fasting blood glucose: 98 mg/dl COMPARISON:  CT 12/22/2017 FINDINGS: Mediastinal blood pool activity: SUV max 2.17 NECK: No hypermetabolic lymph nodes in the neck. Incidental CT findings: none CHEST: Small hypermetabolic lymph node in the LEFT upper mediastinum adjacent to the LEFT subclavian artery measures only 6 mm but has intense metabolic activity SUV max equal 10.9. No additional mediastinal adenopathy no suspicious pulmonary nodularity. Incidental CT findings: none ABDOMEN/PELVIS: Intense hypermetabolic adenopathy in the porta hepatis. The nodes are mildly enlarged at 15 mm but with intense metabolic activity (SUV max equal 27.2). Hypermetabolic adenopathy in the retroperitoneum LEFT of the aorta measures 3.5 cm with SUV max equal 36.9. Adenopathy extends along the LEFT iliac vessels. Massively enlarged LEFT external iliac lymph node with some central necrosis measures 8.0 x 12.5 cm and with SUV max equal 36.7. Enlarged LEFT inguinal lymph node with SUV max equal 38. No abnormal activity in the liver. The spleen is normal size without metabolic activity. No abnormal activity in the bowel. The bladder is displaced rightward by the a LEFT pelvic adenopathy. Incidental CT findings: Multiple  benign-appearing cysts within the kidneys SKELETON: There several discrete metabolic foci within the skeleton without clear CT correlation. For example lesion in the LEFT sacral ala with SUV max equal 12.3. Lesion in theupper thoracic spine with SUV max equal 23.9. Lesion inferior body of LEFT scapula with SUV max equal 8.5 Incidental CT findings: none IMPRESSION: 1. Massively enlarged pelvic lymph nodes intense metabolic activity consistent lymphoma. Additional 2. Additional hypermetabolic lymph nodes in the porta hepatis and retroperitoneum LEFT aorta. 3. Solitary hypermetabolic mediastinal lymph node in the upper LEFT mediastinum. 4. 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 5. Normal spleen. Electronically Signed   By: Suzy Bouchard M.D.   On: 01/07/2018 10:55   Dg Abd 2 Views  Result Date: 12/22/2017 CLINICAL DATA:  Left groin swelling. EXAM: ABDOMEN - 2 VIEW COMPARISON:  CT scan of February 12, 2010. FINDINGS: The bowel gas pattern is normal. There is no evidence of free air. No radio-opaque calculi or other significant radiographic abnormality is seen. IMPRESSION: No evidence of bowel obstruction or ileus. Electronically Signed   By: Marijo Conception, M.D.   On: 12/22/2017 15:16   [place] Core Biopsy (lymph Nodes)  Result Date: 12/26/2017 CLINICAL DATA:  Enlarged left inguinal lymph node. EXAM: ULTRASOUND GUIDED CORE BIOPSY OF LEFT INGUINAL LYMPH NODE MEDICATIONS: 1.0 mg IV Versed; 50 mcg IV Fentanyl Total Moderate Sedation Time: 13 minutes. The patient's level of consciousness and physiologic status were continuously monitored during the procedure by Radiology  nursing. PROCEDURE: The procedure, risks, benefits, and alternatives were explained to the patient. Questions regarding the procedure were encouraged and answered. The patient understands and consents to the procedure. A time out was performed prior to initiating the procedure. Ultrasound was performed of  the left groin. The left inguinal region was prepped with chlorhexidine in a sterile fashion, and a sterile drape was applied covering the operative field. A sterile gown and sterile gloves were used for the procedure. Local anesthesia was provided with 1% Lidocaine. Under ultrasound guidance, a total of 6 separate 16 gauge core biopsy samples were obtained through different portions of an enlarged left inguinal lymph node. Core biopsy samples were submitted in saline. Additional ultrasound was performed after biopsy. COMPLICATIONS: None. FINDINGS: Large hypoechoic left inguinal lymph node measures approximately 7 cm in maximum diameter by ultrasound. The lymph node was sampled in several different portions by core biopsy. Solid tissue was obtained. Post biopsy imaging shows no evidence of hemorrhage. IMPRESSION: Ultrasound-guided core biopsy performed of enlarged left inguinal lymph node measuring 7 cm in maximum diameter by ultrasound. Six total 16 gauge core biopsy samples were obtained and submitted in saline. Electronically Signed   By: Aletta Edouard M.D.   On: 12/26/2017 17:06   Ir Imaging Guided Port Insertion  Result Date: 01/15/2018 INDICATION: 62 year old male with diffuse large B-cell lymphoma in need of durable venous access to begin chemotherapy. EXAM: IMPLANTED PORT A CATH PLACEMENT WITH ULTRASOUND AND FLUOROSCOPIC GUIDANCE MEDICATIONS: 2 g Ancef; The antibiotic was administered within an appropriate time interval prior to skin puncture. ANESTHESIA/SEDATION: Versed 2 mg IV; Fentanyl 100 mcg IV; Moderate Sedation Time:  19 minutes The patient was continuously monitored during the procedure by the interventional radiology nurse under my direct supervision. FLUOROSCOPY TIME:  0 minutes, 24 seconds (20 mGy) COMPLICATIONS: None immediate. PROCEDURE: The right neck and chest was prepped with chlorhexidine, and draped in the usual sterile fashion using maximum barrier technique (cap and mask, sterile  gown, sterile gloves, large sterile sheet, hand hygiene and cutaneous antiseptic). Antibiotic prophylaxis was provided with 2g Ancef administered IV one hour prior to skin incision. Local anesthesia was attained by infiltration with 1% lidocaine with epinephrine. Ultrasound demonstrated patency of the right internal jugular vein, and this was documented with an image. Under real-time ultrasound guidance, this vein was accessed with a 21 gauge micropuncture needle and image documentation was performed. A small dermatotomy was made at the access site with an 11 scalpel. A 0.018" wire was advanced into the SVC and the access needle exchanged for a 67F micropuncture vascular sheath. The 0.018" wire was then removed and a 0.035" wire advanced into the IVC. An appropriate location for the subcutaneous reservoir was selected below the clavicle and an incision was made through the skin and underlying soft tissues. The subcutaneous tissues were then dissected using a combination of blunt and sharp surgical technique and a pocket was formed. A single lumen power injectable portacatheter was then tunneled through the subcutaneous tissues from the pocket to the dermatotomy and the port reservoir placed within the subcutaneous pocket. The venous access site was then serially dilated and a peel away vascular sheath placed over the wire. The wire was removed and the port catheter advanced into position under fluoroscopic guidance. The catheter tip is positioned in the superior cavoatrial junction. This was documented with a spot image. The portacatheter was then tested and found to flush and aspirate well. The port was flushed with saline followed by 100 units/mL  heparinized saline. The pocket was then closed in two layers using first subdermal inverted interrupted absorbable sutures followed by a running subcuticular suture. The epidermis was then sealed with Dermabond. The dermatotomy at the venous access site was also closed  with Dermabond. IMPRESSION: Successful placement of a right IJ approach Power Port with ultrasound and fluoroscopic guidance. The catheter is ready for use. Electronically Signed   By: [name] M.D.   On: 01/15/2018 16:49    Discharge Laboratory Values: . CBC Latest Ref Rng & Units 01/19/2018 01/18/2018 01/17/2018  WBC 4.0 - 10.5 K/uL 4.6 6.1 9.7  Hemoglobin 13.0 - 17.0 g/dL 12.6(L) 12.3(L) 12.3(L)  Hematocrit 39.0 - 52.0 % 37.7(L) 37.4(L) 38.6(L)  Platelets 150 - 400 K/uL 261 264 269    CMP Latest Ref Rng & Units 01/19/2018 01/18/2018 01/17/2018  Glucose 70 - 99 mg/dL 121(H) 130(H) 149(H)  BUN 8 - 23 mg/dL 19 19 20   Creatinine 0.61 - 1.24 mg/dL 0.91 0.91 0.89  Sodium 135 - 145 mmol/L 142 142 140  Potassium 3.5 - 5.1 mmol/L 3.5 3.7 4.0  Chloride 98 - 111 mmol/L 104 106 104  CO2 22 - 32 mmol/L 29 28 27   Calcium 8.9 - 10.3 mg/dL 9.2 9.0 9.1  Total Protein 6.5 - 8.1 g/dL 5.9(L) 5.9(L) 6.4(L)  Total Bilirubin 0.3 - 1.2 mg/dL 0.8 0.8 0.8  Alkaline Phos 38 - 126 U/L 43 40 49  AST 15 - 41 U/L 15 17 17   ALT 0 - 44 U/L 18 17 16     Brief H and P: For complete details please refer to admission H and P, but in brief,  Jesus [name] is a wonderful 62 y.o. 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  CT of the abdomen and pelvis performed on 12/22/2017 showed bulky left inguinal, left hemipelvic, and retroperitoneal adenopathy.   01/02/18 Left inguinal LN Biopsy revealed T-Cell/histocyte rich Large B-Cell Lymphoma  12/27/17 ECHO revealed LV EF of 55-60%   01/05/18 PET/CT revealedMassively enlarged pelvic lymph nodes  intense metabolic activity consistent lymphoma. 2. Additional hypermetabolic lymph nodes in the porta hepatis and retroperitoneum LEFT aorta. 3. Solitary hypermetabolic mediastinal lymph node in the upper LEFT Mediastinum. 4. 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 5. Normal spleen.  HIV non reactive on 12/22/2017.Hep C and hep B serology negative.  2) left lower extremity swelling-stable Doppler ultrasound for DVT was negative in the left lower extremity.  Likely from venous compression +/- lymphatic obstruction from bulky left inguinal, left hemipelvic, and retroperitoneal adenopathy.   3) Intermediate to high risk for tumor lysis syndrome.  4) S/p Port a cath placement today  PLAN: -The pt had no prohibitive toxicities from continuing C1 EPOCH-R at this time.   -Continue eating well and staying hydrated  -Rituxan on day 5 - today prior to discharge. Will plan to do this as outpatient from next cycle. -Neulasta/Udenycha as outpatient on 01/22/2018 -Encouraged the pt to ambulate periodically to prevent blood clots as well  -stable uric acid levels -continue allopurinol at current dose 100 mg p.o. twice daily till C2 and will discontinued  5) DVT prophylaxis -lovenox, SCD, ambulation.  6) HTN -Continue outpatient antihypertensives and monitor  7) Mild hemorrhoidal bleeding. hgb stable. Prn anusol   Physical Exam at Discharge: BP 140/78 (BP Location: Right Arm)   Pulse 74   Temp 98.3 F (36.8 C) (Oral)   Resp 16   Ht 6' 1"  (1.854 m)   Wt 295 lb 3.1 oz (133.9 kg)   SpO2 93%   BMI 38.95 kg/m   GENERAL:alert, in no acute distress and comfortable SKIN: no acute rashes, no significant lesions EYES: conjunctiva are pink and non-injected, sclera anicteric OROPHARYNX: MMM, no exudates, no oropharyngeal erythema or ulceration NECK: supple, no JVD LYMPH:  no palpable lymphadenopathy in the cervical, axillary or inguinal  regions LUNGS: clear to auscultation b/l with normal respiratory effort HEART: regular rate & rhythm ABDOMEN:  normoactive bowel sounds , non tender, not distended. Extremity: no pedal edema PSYCH: alert & oriented x 3 with fluent speech NEURO: no focal motor/sensory deficits  Hospital Course:  Active Problems:   Diffuse large B cell lymphoma (HCC)   Encounter for antineoplastic chemotherapy   At high risk of tumor lysis syndrome   Swelling of lower leg   Diet:  Cardiac healthy   Activity:  Infection precautions as recommended  Condition at Discharge:   Stable  Signed: Dr. Sullivan Lone MD [place] [phone]  01/19/2018, 1:38 PM TT spent discharging patient >12mns

## 2018-01-19 NOTE — Telephone Encounter (Signed)
Spoke to pt regarding upcoming appts per 9/13 sch message

## 2018-01-19 NOTE — Progress Notes (Signed)
Spoke w/ Dr. Irene Limbo, patient is to get rituximab today while inpatient (01/19/18). He will likely get rituximab outpatient in the future. He will be scheduled to receive Udenyca on Monday, 9/16, at the clinic.   Demetrius Charity, PharmD Oncology Pharmacist Pharmacy Phone: [phone] 01/19/2018

## 2018-01-22 ENCOUNTER — Inpatient Hospital Stay: Payer: 59

## 2018-01-22 DIAGNOSIS — Z7982 Long term (current) use of aspirin: Secondary | ICD-10-CM | POA: Diagnosis not present

## 2018-01-22 DIAGNOSIS — F17210 Nicotine dependence, cigarettes, uncomplicated: Secondary | ICD-10-CM | POA: Diagnosis not present

## 2018-01-22 DIAGNOSIS — K4090 Unilateral inguinal hernia, without obstruction or gangrene, not specified as recurrent: Secondary | ICD-10-CM | POA: Insufficient documentation

## 2018-01-22 DIAGNOSIS — R61 Generalized hyperhidrosis: Secondary | ICD-10-CM | POA: Diagnosis not present

## 2018-01-22 DIAGNOSIS — R350 Frequency of micturition: Secondary | ICD-10-CM | POA: Diagnosis not present

## 2018-01-22 DIAGNOSIS — C8338 Diffuse large B-cell lymphoma, lymph nodes of multiple sites: Secondary | ICD-10-CM | POA: Insufficient documentation

## 2018-01-22 DIAGNOSIS — R7303 Prediabetes: Secondary | ICD-10-CM | POA: Diagnosis not present

## 2018-01-22 DIAGNOSIS — Z79899 Other long term (current) drug therapy: Secondary | ICD-10-CM | POA: Diagnosis not present

## 2018-01-22 DIAGNOSIS — I10 Essential (primary) hypertension: Secondary | ICD-10-CM | POA: Diagnosis not present

## 2018-01-22 DIAGNOSIS — E785 Hyperlipidemia, unspecified: Secondary | ICD-10-CM | POA: Diagnosis not present

## 2018-01-22 DIAGNOSIS — R600 Localized edema: Secondary | ICD-10-CM | POA: Diagnosis not present

## 2018-01-22 DIAGNOSIS — Z7901 Long term (current) use of anticoagulants: Secondary | ICD-10-CM | POA: Diagnosis not present

## 2018-01-22 DIAGNOSIS — Z7689 Persons encountering health services in other specified circumstances: Secondary | ICD-10-CM | POA: Insufficient documentation

## 2018-01-22 DIAGNOSIS — K649 Unspecified hemorrhoids: Secondary | ICD-10-CM | POA: Insufficient documentation

## 2018-01-22 DIAGNOSIS — I739 Peripheral vascular disease, unspecified: Secondary | ICD-10-CM | POA: Insufficient documentation

## 2018-01-22 DIAGNOSIS — R51 Headache: Secondary | ICD-10-CM | POA: Insufficient documentation

## 2018-01-22 DIAGNOSIS — G629 Polyneuropathy, unspecified: Secondary | ICD-10-CM | POA: Insufficient documentation

## 2018-01-22 DIAGNOSIS — Z87442 Personal history of urinary calculi: Secondary | ICD-10-CM | POA: Diagnosis not present

## 2018-01-22 DIAGNOSIS — Z7189 Other specified counseling: Secondary | ICD-10-CM

## 2018-01-22 MED ORDER — PEGFILGRASTIM-CBQV 6 MG/0.6ML ~~LOC~~ SOSY
6.0000 mg | PREFILLED_SYRINGE | Freq: Once | SUBCUTANEOUS | Status: AC
  Administered 2018-01-22: 6 mg via SUBCUTANEOUS

## 2018-01-22 NOTE — Patient Instructions (Signed)
Pegfilgrastim injection What is this medicine? PEGFILGRASTIM (PEG fil gra stim) is a long-acting granulocyte colony-stimulating factor that stimulates the growth of neutrophils, a type of white blood cell important in the body's fight against infection. It is used to reduce the incidence of fever and infection in patients with certain types of cancer who are receiving chemotherapy that affects the bone marrow, and to increase survival after being exposed to high doses of radiation. This medicine may be used for other purposes; ask your health care provider or pharmacist if you have questions. COMMON BRAND NAME(S): Neulasta What should I tell my health care provider before I take this medicine? They need to know if you have any of these conditions: -kidney disease -latex allergy -ongoing radiation therapy -sickle cell disease -skin reactions to acrylic adhesives (On-Body Injector only) -an unusual or allergic reaction to pegfilgrastim, filgrastim, other medicines, foods, dyes, or preservatives -pregnant or trying to get pregnant -breast-feeding How should I use this medicine? This medicine is for injection under the skin. If you get this medicine at home, you will be taught how to prepare and give the pre-filled syringe or how to use the On-body Injector. Refer to the patient Instructions for Use for detailed instructions. Use exactly as directed. Tell your healthcare provider immediately if you suspect that the On-body Injector may not have performed as intended or if you suspect the use of the On-body Injector resulted in a missed or partial dose. It is important that you put your used needles and syringes in a special sharps container. Do not put them in a trash can. If you do not have a sharps container, call your pharmacist or healthcare provider to get one. Talk to your pediatrician regarding the use of this medicine in children. While this drug may be prescribed for selected conditions,  precautions do apply. Overdosage: If you think you have taken too much of this medicine contact a poison control center or emergency room at once. NOTE: This medicine is only for you. Do not share this medicine with others. What if I miss a dose? It is important not to miss your dose. Call your doctor or health care professional if you miss your dose. If you miss a dose due to an On-body Injector failure or leakage, a new dose should be administered as soon as possible using a single prefilled syringe for manual use. What may interact with this medicine? Interactions have not been studied. Give your health care provider a list of all the medicines, herbs, non-prescription drugs, or dietary supplements you use. Also tell them if you smoke, drink alcohol, or use illegal drugs. Some items may interact with your medicine. This list may not describe all possible interactions. Give your health care provider a list of all the medicines, herbs, non-prescription drugs, or dietary supplements you use. Also tell them if you smoke, drink alcohol, or use illegal drugs. Some items may interact with your medicine. What should I watch for while using this medicine? You may need blood work done while you are taking this medicine. If you are going to need a MRI, CT scan, or other procedure, tell your doctor that you are using this medicine (On-Body Injector only). What side effects may I notice from receiving this medicine? Side effects that you should report to your doctor or health care professional as soon as possible: -allergic reactions like skin rash, itching or hives, swelling of the face, lips, or tongue -dizziness -fever -pain, redness, or irritation at site  where injected -pinpoint red spots on the skin -red or dark-brown urine -shortness of breath or breathing problems -stomach or side pain, or pain at the shoulder -swelling -tiredness -trouble passing urine or change in the amount of urine Side  effects that usually do not require medical attention (report to your doctor or health care professional if they continue or are bothersome): -bone pain -muscle pain This list may not describe all possible side effects. Call your doctor for medical advice about side effects. You may report side effects to FDA at 1-800-FDA-1088. Where should I keep my medicine? Keep out of the reach of children. Store pre-filled syringes in a refrigerator between 2 and 8 degrees C (36 and 46 degrees F). Do not freeze. Keep in carton to protect from light. Throw away this medicine if it is left out of the refrigerator for more than 48 hours. Throw away any unused medicine after the expiration date. NOTE: This sheet is a summary. It may not cover all possible information. If you have questions about this medicine, talk to your doctor, pharmacist, or health care provider.  2018 Elsevier/Gold Standard (2016-04-21 12:58:03)

## 2018-01-23 ENCOUNTER — Telehealth: Payer: Self-pay | Admitting: General Practice

## 2018-01-23 NOTE — Telephone Encounter (Signed)
[place] CSW Progress Notes  Call to follow up w patient re referral for social work assistance.  Per patient, "I am just waiting for my money to come in, I have not been paid in 5 weeks."   Has applied for short term disability via his work, will be able to have some financial support via this option during treatment.  No current needs as he has been told his short term disability will kick in soon.  Encouraged to call as needed for support/resources.  Edwyna Shell, LCSW Clinical Social Worker Phone:  [phone]

## 2018-01-25 NOTE — Progress Notes (Signed)
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 male is here because of left lower extremity edema and lymphadenopathy.  The patient was seen in the emergency room this past Friday for the same issue.  A CT of the abdomen and pelvis was performed showing bulky left inguinal, left hemipelvic, and retroperitoneal adenopathy.  He was referred to [place] from the emergency room for further evaluation.  Doppler ultrasound of the left lower extremity was performed and was negative for DVT. Patient reports that he has been having left lower extremity edema in his left groin and left leg for approximately 1 month.  He states that the swelling in the left groin started to get better but then worsened.  The left lower extremity edema has slowly worsened over time.  Patient denies having fevers and chills.  He reports that he does have night sweats at times.  He reported having headaches approximate 1 [place]  He thinks his headaches are related to not having his blood pressure medication.  His headaches have now resolved.  He denies visual changes.  The patient denies chest pain, shortness of breath and cough.  No nausea, vomiting, constipation, diarrhea.  Denies abdominal pain.  The patient denies recent weight loss and has actually gained weight recently.  Patient denies epistaxis, bleeding gums, hemoptysis, hematuria, but occasionally, and melena.  He reports increased urinary frequency over the past month but no dysuria.  The patient is here for evaluation and discussion of his recent CT and lab findings.  Interval History:   SOLON ALBAN returns today for management and  evaluation of his newly diagnosed T-Cell/histocyte rich Large B-Cell Lymphoma. I last saw the pt at discharge from his C1 EPOCH-R on 01/19/18. He is accompanied today by his daughter. [name].   The pt reports that his fingers have felt a little tingly in the past week, which he notes are in only some of his finger tips. The pt notes that his leg swelling has improved 30-40%. He notes that he is walking a little bit, but not 20-30 minutes a day. He adds that he feels a little bloated, and has been eating and drinking very well.   The pt notes that he has continued to use salt and baking soda mouthwashes and has no mouth sores.   The pt notes that he had minor bone aches after receiving Neulasta injection at the beginning of the week.   Lab results today (01/26/18) of CBC w/diff, CMP is as follows: all values are WNL except for WBC at 3.4k, RBC at 3.72, HGB at 11.0, HCT at 33.9, PLT at 125k, Glucose at 156, Calcium at 8.7, Total Protein at 5.6, Albumin at 3.4, AST at 9. 01/26/18 Uric acid is WNL at 3.9 01/26/18 Phosphorous is WNL 3.7 02/04/18 LDH is WNL at 124  On review of systems, pt reports some neuropathy in finger tips, reduced leg swelling, eating well, staying hydrated, occasional night sweats, and denies nausea, vomiting, diarrhea, fevers, chills, mouth sores, bone pains, pain along the spine, flank pain, and any other symptoms.   MEDICAL  HISTORY:  Past Medical History:  Diagnosis Date  . Allergy   . History of kidney stones   . Hyperlipidemia   . Hypertension   . Lymphadenopathy   . Pain, lower leg    Bilateral  . Peripheral arterial disease ([place])   . Pre-diabetes   . Red-green color blindness   . Snores   . Wears glasses     SURGICAL HISTORY: Past Surgical History:  Procedure Laterality Date  . CATARACT EXTRACTION W/ INTRAOCULAR LENS  IMPLANT, BILATERAL    . COLONOSCOPY    . dislodged salava stone    . FRACTURE SURGERY    . HAND  ARTHROPLASTY  1995   crushed left hand  . INGUINAL LYMPH NODE BIOPSY Left 01/02/2018   Procedure: LEFT INGUINAL LYMPH NODE BIOPSY;  Surgeon: Rolm Bookbinder, MD;  Location: [place];  Service: General;  Laterality: Left;  . IR IMAGING GUIDED PORT INSERTION  01/15/2018  . MICROLARYNGOSCOPY Left 01/17/2014   Procedure: MICROLARYNGOSCOPY WITH EXCISION OF THE BIOPSY OF LEFT VOCAL CORD LESION;  Surgeon: Izora Gala, MD;  Location: [facility];  Service: ENT;  Laterality: Left;  . ORIF FOOT FRACTURE  2005   left    SOCIAL HISTORY: Social History   Socioeconomic History  . Marital status: Divorced    Spouse name: Not on file  . Number of children: 3  . Years of education: Not on file  . Highest education level: Not on file  Occupational History  . Not on file  Social Needs  . Financial resource strain: Not on file  . Food insecurity:    Worry: Not on file    Inability: Not on file  . Transportation needs:    Medical: Not on file    Non-medical: Not on file  Tobacco Use  . Smoking status: Current Every Day Smoker    Packs/day: 0.50    Years: 36.00    Pack years: 18.00    Types: Cigarettes  . Smokeless tobacco: Never Used  Substance and Sexual Activity  . Alcohol use: Yes    Alcohol/week: 15.0 standard drinks    Types: 10 Cans of beer, 5 Shots of liquor per week    Comment: weekends  . Drug use: Yes    Types: Cocaine    Comment: reports cocaine usage ~2X/ month; last use 12/26/17  . Sexual activity: Not on file  Lifestyle  . Physical activity:    Days per week: Not on file    Minutes per session: Not on file  . Stress: Not on file  Relationships  . Social connections:    Talks on phone: Not on file    Gets together: Not on file    Attends religious service: Not on file    Active member of club or organization: Not on file    Attends meetings of clubs or organizations: Not on file    Relationship status: Not on file  . Intimate partner violence:    Fear of  current or ex partner: Not on file    Emotionally abused: Not on file    Physically abused: Not on file    Forced sexual activity: Not on file  Other Topics Concern  . Not on file  Social History Narrative  . Not on file    FAMILY HISTORY: Family History  Problem Relation Age of Onset  . Breast cancer Mother   . Diabetes Father   . Hypertension Father   . Stroke Father   .  Mental illness Sister   . Hypertension Daughter   . Mental illness Daughter   . Hypertension Brother   . Colon cancer Brother   . Breast cancer Sister     ALLERGIES:  is allergic to bee venom.  MEDICATIONS:  Current Outpatient Medications  Medication Sig Dispense Refill  . allopurinol (ZYLOPRIM) 100 MG tablet Take 1 tablet (100 mg total) by mouth 2 (two) times daily. 60 tablet 0  . amLODipine (NORVASC) 10 MG tablet Take 10 mg by mouth daily.  1  . aspirin 81 MG tablet Take 81 mg by mouth daily.    Marland Kitchen CIALIS 20 MG tablet Take 20 mg by mouth daily as needed for erectile dysfunction.   0  . dexamethasone (DECADRON) 4 MG tablet Take 2 tablets (8 mg total) by mouth 2 (two) times daily with a meal. Take two times a day starting the day after chemotherapy for 3 days. 30 tablet 1  . hydrocortisone (ANUSOL-HC) 2.5 % rectal cream Place 1 application rectally 2 (two) times daily as needed for hemorrhoids. 30 g 0  . lisinopril (PRINIVIL,ZESTRIL) 20 MG tablet Take 20 mg by mouth daily.    Marland Kitchen LORazepam (ATIVAN) 0.5 MG tablet Take 1 tablet (0.5 mg total) by mouth every 6 (six) hours as needed (Nausea or vomiting). 30 tablet 0  . Multiple Vitamins-Minerals (MULTIVITAMIN WITH MINERALS) tablet Take 1 tablet by mouth daily.    . ondansetron (ZOFRAN) 8 MG tablet Take 1 tablet (8 mg total) by mouth every 8 (eight) hours as needed for nausea or vomiting. 20 tablet 0  . prochlorperazine (COMPAZINE) 10 MG tablet Take 1 tablet (10 mg total) by mouth every 6 (six) hours as needed (Nausea or vomiting). 30 tablet 1  . traMADol (ULTRAM)  50 MG tablet Take 2 tablets (100 mg total) by mouth every 6 (six) hours as needed. (Patient taking differently: Take 100 mg by mouth every 6 (six) hours as needed for moderate pain. ) 10 tablet 0   No current facility-administered medications for this visit.     REVIEW OF SYSTEMS:   A 10+ POINT REVIEW OF SYSTEMS WAS OBTAINED including neurology, dermatology, psychiatry, cardiac, respiratory, lymph, extremities, GI, GU, Musculoskeletal, constitutional, breasts, reproductive, HEENT.  All pertinent positives are noted in the HPI.  All others are negative.   PHYSICAL EXAMINATION: ECOG PERFORMANCE STATUS: 1 - Symptomatic but completely ambulatory  Vitals:   01/26/18 1131  BP: (!) 145/73  Pulse: 70  Resp: 18  Temp: 98.1 F (36.7 C)  SpO2: 99%   Filed Weights   01/26/18 1131  Weight: (!) 303 lb 4.8 oz (137.6 kg)    GENERAL:alert, in no acute distress and comfortable SKIN: no acute rashes, no significant lesions EYES: conjunctiva are pink and non-injected, sclera anicteric OROPHARYNX: MMM, no exudates, no oropharyngeal erythema or ulceration NECK: supple, no JVD LYMPH:  no palpable lymphadenopathy in the cervical, axillary or inguinal regions LUNGS: clear to auscultation b/l with normal respiratory effort HEART: regular rate & rhythm ABDOMEN:  normoactive bowel sounds , non tender, not distended. No palpable hepatosplenomegaly.  Extremity: no pedal edema PSYCH: alert & oriented x 3 with fluent speech NEURO: no focal motor/sensory deficits   LABORATORY DATA:  I have reviewed the data as listed CBC Latest Ref Rng & Units 01/26/2018 01/19/2018 01/18/2018  WBC 4.0 - 10.3 K/uL 3.4(L) 4.6 6.1  Hemoglobin 13.0 - 17.1 g/dL 11.0(L) 12.6(L) 12.3(L)  Hematocrit 38.4 - 49.9 % 33.9(L) 37.7(L) 37.4(L)  Platelets 140 - 400  K/uL 125(L) 261 264    CMP Latest Ref Rng & Units 01/26/2018 01/19/2018 01/18/2018  Glucose 70 - 99 mg/dL 156(H) 121(H) 130(H)  BUN 8 - 23 mg/dL 15 19 19   Creatinine 0.61 -  1.24 mg/dL 0.90 0.91 0.91  Sodium 135 - 145 mmol/L 143 142 142  Potassium 3.5 - 5.1 mmol/L 3.5 3.5 3.7  Chloride 98 - 111 mmol/L 103 104 106  CO2 22 - 32 mmol/L 31 29 28   Calcium 8.9 - 10.3 mg/dL 8.7(L) 9.2 9.0  Total Protein 6.5 - 8.1 g/dL 5.6(L) 5.9(L) 5.9(L)  Total Bilirubin 0.3 - 1.2 mg/dL 0.4 0.8 0.8  Alkaline Phos 38 - 126 U/L 62 43 40  AST 15 - 41 U/L 9(L) 15 17  ALT 0 - 44 U/L 20 18 17    827/19 Left Inguinal LN Bx:   12/26/17 Tissue Flow Cytometry:   12/26/17 Inguinal Core biopsy:    RADIOGRAPHIC STUDIES: I have personally reviewed the radiological images as listed and agreed with the findings in the report. Nm Pet Image Initial (pi) Skull Base To Thigh  Result Date: 01/07/2018 CLINICAL DATA:  Initial treatment strategy for B-cell lymphoma. EXAM: NUCLEAR MEDICINE PET SKULL BASE TO THIGH TECHNIQUE: 14.9 mCi F-18 FDG was injected intravenously. Full-ring PET imaging was performed from the skull base to thigh after the radiotracer. CT data was obtained and used for attenuation correction and anatomic localization. Fasting blood glucose: 98 mg/dl COMPARISON:  CT 12/22/2017 FINDINGS: Mediastinal blood pool activity: SUV max 2.17 NECK: No hypermetabolic lymph nodes in the neck. Incidental CT findings: none CHEST: Small hypermetabolic lymph node in the LEFT upper mediastinum adjacent to the LEFT subclavian artery measures only 6 mm but has intense metabolic activity SUV max equal 10.9. No additional mediastinal adenopathy no suspicious pulmonary nodularity. Incidental CT findings: none ABDOMEN/PELVIS: Intense hypermetabolic adenopathy in the porta hepatis. The nodes are mildly enlarged at 15 mm but with intense metabolic activity (SUV max equal 27.2). Hypermetabolic adenopathy in the retroperitoneum LEFT of the aorta measures 3.5 cm with SUV max equal 36.9. Adenopathy extends along the LEFT iliac vessels. Massively enlarged LEFT external iliac lymph node with some central necrosis measures  8.0 x 12.5 cm and with SUV max equal 36.7. Enlarged LEFT inguinal lymph node with SUV max equal 38. No abnormal activity in the liver. The spleen is normal size without metabolic activity. No abnormal activity in the bowel. The bladder is displaced rightward by the a LEFT pelvic adenopathy. Incidental CT findings: Multiple benign-appearing cysts within the kidneys SKELETON: There several discrete metabolic foci within the skeleton without clear CT correlation. For example lesion in the LEFT sacral ala with SUV max equal 12.3. Lesion in theupper thoracic spine with SUV max equal 23.9. Lesion inferior body of LEFT scapula with SUV max equal 8.5 Incidental CT findings: none IMPRESSION: 1. Massively enlarged pelvic lymph nodes intense metabolic activity consistent lymphoma. Additional 2. Additional hypermetabolic lymph nodes in the porta hepatis and retroperitoneum LEFT aorta. 3. Solitary hypermetabolic mediastinal lymph node in the upper LEFT mediastinum. 4. 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 5. Normal spleen. Electronically Signed   By: Suzy Bouchard M.D.   On: 01/07/2018 10:55   Ir Imaging Guided Port Insertion  Result Date: 01/15/2018 INDICATION: 62 year old male with diffuse large B-cell lymphoma in need of durable venous access to begin chemotherapy. EXAM: IMPLANTED PORT A CATH PLACEMENT WITH ULTRASOUND AND FLUOROSCOPIC GUIDANCE MEDICATIONS: 2 g Ancef; The antibiotic was administered within an appropriate  time interval prior to skin puncture. ANESTHESIA/SEDATION: Versed 2 mg IV; Fentanyl 100 mcg IV; Moderate Sedation Time:  19 minutes The patient was continuously monitored during the procedure by the interventional radiology nurse under my direct supervision. FLUOROSCOPY TIME:  0 minutes, 24 seconds (20 mGy) COMPLICATIONS: None immediate. PROCEDURE: The right neck and chest was prepped with chlorhexidine, and draped in the usual sterile fashion using  maximum barrier technique (cap and mask, sterile gown, sterile gloves, large sterile sheet, hand hygiene and cutaneous antiseptic). Antibiotic prophylaxis was provided with 2g Ancef administered IV one hour prior to skin incision. Local anesthesia was attained by infiltration with 1% lidocaine with epinephrine. Ultrasound demonstrated patency of the right internal jugular vein, and this was documented with an image. Under real-time ultrasound guidance, this vein was accessed with a 21 gauge micropuncture needle and image documentation was performed. A small dermatotomy was made at the access site with an 11 scalpel. A 0.018" wire was advanced into the SVC and the access needle exchanged for a 81F micropuncture vascular sheath. The 0.018" wire was then removed and a 0.035" wire advanced into the IVC. An appropriate location for the subcutaneous reservoir was selected below the clavicle and an incision was made through the skin and underlying soft tissues. The subcutaneous tissues were then dissected using a combination of blunt and sharp surgical technique and a pocket was formed. A single lumen power injectable portacatheter was then tunneled through the subcutaneous tissues from the pocket to the dermatotomy and the port reservoir placed within the subcutaneous pocket. The venous access site was then serially dilated and a peel away vascular sheath placed over the wire. The wire was removed and the port catheter advanced into position under fluoroscopic guidance. The catheter tip is positioned in the superior cavoatrial junction. This was documented with a spot image. The portacatheter was then tested and found to flush and aspirate well. The port was flushed with saline followed by 100 units/mL heparinized saline. The pocket was then closed in two layers using first subdermal inverted interrupted absorbable sutures followed by a running subcuticular suture. The epidermis was then sealed with Dermabond. The  dermatotomy at the venous access site was also closed with Dermabond. IMPRESSION: Successful placement of a right IJ approach Power Port with ultrasound and fluoroscopic guidance. The catheter is ready for use. Electronically Signed   By: [name] M.D.   On: 01/15/2018 16:49    ASSESSMENT & PLAN:   This is a pleasant 62 y.o. 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  CT of the abdomen and pelvis performed on 12/22/2017 showed bulky left inguinal, left hemipelvic, and retroperitoneal adenopathy.   01/02/18 Left inguinal LN Biopsy revealed T-Cell/histocyte rich Large B-Cell Lymphoma  12/27/17 ECHO revealed LV EF of 55-60%   01/05/18 PET/CT revealedMassively enlarged pelvic lymph nodes intense metabolic activity consistent lymphoma. 2. Additional hypermetabolic lymph nodes in the porta hepatis and retroperitoneum LEFT aorta. 3. Solitary hypermetabolic mediastinal lymph node in the upper LEFT Mediastinum. 4. 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 5. Normal spleen.  HIV non reactive on 12/22/2017.Hep C and hep B serology negative.  2) left lower extremity swelling-stable Doppler ultrasound for DVT was negative in the left lower extremity.  Likely from venous compression +/- lymphatic obstruction from bulky left  inguinal, left hemipelvic, and retroperitoneal adenopathy.   3) Intermediate to high risk for tumor lysis syndrome.  4) S/p Port a cath placement   PLAN:  -We will watch for increasing leg swelling and fluid overload. -Discussed pt labwork today, 01/26/18; blood counts and chemistries are stable. LDH and uric acid is normal.  -Continue on  Allopurinol until beginning of C2 -Pt has tolerated C1 of EPOCH-R very well -Pt received Neulasta shot earlier this week without bothersome bone pains -Recommended that the pt wear his compression socks -Hold off on dental cleanings and interventions unless an acute issue such as a dental abscess occurs    -Recommended that the pt continue to eat well, drink at least 48-64 oz of water each day, and walk 20-30 minutes each day.  -Will see the pt back before his admission for C2 of EPOCH-R   5) DVT prophylaxis -lovenox, SCD, ambulation.  6) HTN -Continue outpatient antihypertensives and monitor   7) Mild hemorrhoidal bleeding. hgb stable. Prn anusol   No orders of the defined types were placed in this encounter.   RTC with Dr Irene Limbo on 02/02/2018 with labs Inpatient admission for [facility] for 5 days from 02/05/2018 Outpatient Rituxan and Neulasta on 02/12/2018   All questions were answered. The patient knows to call the clinic with any problems, questions or concerns.  The total time spent in the appt was 25 minutes and more than 50% was on counseling and direct patient cares.    Sullivan Lone MD MS AAHIVMS [facility] Hematology/Oncology Physician [facility]  (Office):       [phone] (Work cell):  [phone] (Fax):           [phone]  I, Baldwin Jamaica, am acting as a scribe for Dr. Irene Limbo  .I have reviewed the above documentation for accuracy and completeness, and I agree with the above. Brunetta Genera MD

## 2018-01-26 ENCOUNTER — Telehealth: Payer: Self-pay

## 2018-01-26 ENCOUNTER — Inpatient Hospital Stay (HOSPITAL_BASED_OUTPATIENT_CLINIC_OR_DEPARTMENT_OTHER): Payer: 59 | Admitting: Hematology

## 2018-01-26 ENCOUNTER — Inpatient Hospital Stay: Payer: 59

## 2018-01-26 VITALS — BP 145/73 | HR 70 | Temp 98.1°F | Resp 18 | Ht 73.0 in | Wt 303.3 lb

## 2018-01-26 DIAGNOSIS — R600 Localized edema: Secondary | ICD-10-CM | POA: Diagnosis not present

## 2018-01-26 DIAGNOSIS — E785 Hyperlipidemia, unspecified: Secondary | ICD-10-CM

## 2018-01-26 DIAGNOSIS — K4090 Unilateral inguinal hernia, without obstruction or gangrene, not specified as recurrent: Secondary | ICD-10-CM

## 2018-01-26 DIAGNOSIS — C8338 Diffuse large B-cell lymphoma, lymph nodes of multiple sites: Secondary | ICD-10-CM

## 2018-01-26 DIAGNOSIS — K649 Unspecified hemorrhoids: Secondary | ICD-10-CM

## 2018-01-26 DIAGNOSIS — Z7901 Long term (current) use of anticoagulants: Secondary | ICD-10-CM

## 2018-01-26 DIAGNOSIS — R51 Headache: Secondary | ICD-10-CM | POA: Diagnosis not present

## 2018-01-26 DIAGNOSIS — Z79899 Other long term (current) drug therapy: Secondary | ICD-10-CM

## 2018-01-26 DIAGNOSIS — Z7982 Long term (current) use of aspirin: Secondary | ICD-10-CM

## 2018-01-26 DIAGNOSIS — Z87442 Personal history of urinary calculi: Secondary | ICD-10-CM

## 2018-01-26 DIAGNOSIS — R7303 Prediabetes: Secondary | ICD-10-CM

## 2018-01-26 DIAGNOSIS — F17210 Nicotine dependence, cigarettes, uncomplicated: Secondary | ICD-10-CM

## 2018-01-26 DIAGNOSIS — I10 Essential (primary) hypertension: Secondary | ICD-10-CM

## 2018-01-26 DIAGNOSIS — R61 Generalized hyperhidrosis: Secondary | ICD-10-CM

## 2018-01-26 DIAGNOSIS — I739 Peripheral vascular disease, unspecified: Secondary | ICD-10-CM

## 2018-01-26 LAB — PHOSPHORUS: Phosphorus: 3.7 mg/dL (ref 2.5–4.6)

## 2018-01-26 LAB — CMP (CANCER CENTER ONLY)
ALT: 20 U/L (ref 0–44)
AST: 9 U/L — ABNORMAL LOW (ref 15–41)
Albumin: 3.4 g/dL — ABNORMAL LOW (ref 3.5–5.0)
Alkaline Phosphatase: 62 U/L (ref 38–126)
Anion gap: 9 (ref 5–15)
BUN: 15 mg/dL (ref 8–23)
CO2: 31 mmol/L (ref 22–32)
Calcium: 8.7 mg/dL — ABNORMAL LOW (ref 8.9–10.3)
Chloride: 103 mmol/L (ref 98–111)
Creatinine: 0.9 mg/dL (ref 0.61–1.24)
GFR, Est AFR Am: >60 mL/min (ref >60)
GFR, Estimated: >60 mL/min (ref >60)
Glucose, Bld: 156 mg/dL — ABNORMAL HIGH (ref 70–99)
Potassium: 3.5 mmol/L (ref 3.5–5.1)
Sodium: 143 mmol/L (ref 135–145)
Total Bilirubin: 0.4 mg/dL (ref 0.3–1.2)
Total Protein: 5.6 g/dL — ABNORMAL LOW (ref 6.5–8.1)

## 2018-01-26 LAB — CBC WITH DIFFERENTIAL/PLATELET
Basophils Absolute: 0 10*3/uL (ref 0.0–0.1)
Basophils Relative: 0 %
Eosinophils Absolute: 0.2 10*3/uL (ref 0.0–0.5)
Eosinophils Relative: 7 %
HCT: 33.9 % — ABNORMAL LOW (ref 38.4–49.9)
Hemoglobin: 11 g/dL — ABNORMAL LOW (ref 13.0–17.1)
Lymphocytes Relative: 35 %
Lymphs Abs: 1.2 10*3/uL (ref 0.9–3.3)
MCH: 29.6 pg (ref 27.2–33.4)
MCHC: 32.4 g/dL (ref 32.0–36.0)
MCV: 91.1 fL (ref 79.3–98.0)
Monocytes Absolute: 0.4 10*3/uL (ref 0.1–0.9)
Monocytes Relative: 12 %
Neutro Abs: 1.6 10*3/uL (ref 1.5–6.5)
Neutrophils Relative %: 46 %
Platelets: 125 10*3/uL — ABNORMAL LOW (ref 140–400)
RBC: 3.72 MIL/uL — ABNORMAL LOW (ref 4.20–5.82)
RDW: 13.9 % (ref 11.0–14.6)
WBC: 3.4 10*3/uL — ABNORMAL LOW (ref 4.0–10.3)

## 2018-01-26 LAB — LACTATE DEHYDROGENASE: LDH: 124 U/L (ref 98–192)

## 2018-01-26 LAB — URIC ACID: Uric Acid, Serum: 3.9 mg/dL (ref 3.7–8.6)

## 2018-01-26 NOTE — Telephone Encounter (Signed)
Spoke to [facility] in bed placement and made a bed request for inpatient chemo admission on Monday 02/05/18 on 6E at [facility]. Also notified staff on [place] and pharmacy.

## 2018-01-26 NOTE — Telephone Encounter (Signed)
Spoke with patient concerning upcoming appointment. Per Irene Limbo requested date 9/20 los

## 2018-02-01 ENCOUNTER — Other Ambulatory Visit: Payer: Self-pay | Admitting: *Deleted

## 2018-02-01 DIAGNOSIS — C8338 Diffuse large B-cell lymphoma, lymph nodes of multiple sites: Secondary | ICD-10-CM

## 2018-02-01 NOTE — Progress Notes (Signed)
Disability completed and taken to Ms. Wilma at front desk for patient to pick up per request.

## 2018-02-02 ENCOUNTER — Encounter: Payer: Self-pay | Admitting: Hematology

## 2018-02-02 ENCOUNTER — Inpatient Hospital Stay: Payer: 59

## 2018-02-02 ENCOUNTER — Telehealth: Payer: Self-pay

## 2018-02-02 ENCOUNTER — Inpatient Hospital Stay (HOSPITAL_BASED_OUTPATIENT_CLINIC_OR_DEPARTMENT_OTHER): Payer: 59 | Admitting: Hematology

## 2018-02-02 VITALS — BP 123/71 | HR 86 | Temp 98.1°F | Resp 18 | Ht 73.0 in | Wt 286.0 lb

## 2018-02-02 DIAGNOSIS — Z7689 Persons encountering health services in other specified circumstances: Secondary | ICD-10-CM

## 2018-02-02 DIAGNOSIS — I739 Peripheral vascular disease, unspecified: Secondary | ICD-10-CM

## 2018-02-02 DIAGNOSIS — R61 Generalized hyperhidrosis: Secondary | ICD-10-CM | POA: Diagnosis not present

## 2018-02-02 DIAGNOSIS — I10 Essential (primary) hypertension: Secondary | ICD-10-CM

## 2018-02-02 DIAGNOSIS — Z79899 Other long term (current) drug therapy: Secondary | ICD-10-CM

## 2018-02-02 DIAGNOSIS — F17210 Nicotine dependence, cigarettes, uncomplicated: Secondary | ICD-10-CM

## 2018-02-02 DIAGNOSIS — R600 Localized edema: Secondary | ICD-10-CM | POA: Diagnosis not present

## 2018-02-02 DIAGNOSIS — K4090 Unilateral inguinal hernia, without obstruction or gangrene, not specified as recurrent: Secondary | ICD-10-CM

## 2018-02-02 DIAGNOSIS — R7303 Prediabetes: Secondary | ICD-10-CM

## 2018-02-02 DIAGNOSIS — R51 Headache: Secondary | ICD-10-CM | POA: Diagnosis not present

## 2018-02-02 DIAGNOSIS — K649 Unspecified hemorrhoids: Secondary | ICD-10-CM

## 2018-02-02 DIAGNOSIS — E785 Hyperlipidemia, unspecified: Secondary | ICD-10-CM

## 2018-02-02 DIAGNOSIS — Z87442 Personal history of urinary calculi: Secondary | ICD-10-CM

## 2018-02-02 DIAGNOSIS — Z7901 Long term (current) use of anticoagulants: Secondary | ICD-10-CM

## 2018-02-02 DIAGNOSIS — Z7982 Long term (current) use of aspirin: Secondary | ICD-10-CM

## 2018-02-02 DIAGNOSIS — C8338 Diffuse large B-cell lymphoma, lymph nodes of multiple sites: Secondary | ICD-10-CM

## 2018-02-02 DIAGNOSIS — G629 Polyneuropathy, unspecified: Secondary | ICD-10-CM

## 2018-02-02 LAB — CMP (CANCER CENTER ONLY)
ALT: 19 U/L (ref 0–44)
AST: 13 U/L — ABNORMAL LOW (ref 15–41)
Albumin: 3.7 g/dL (ref 3.5–5.0)
Alkaline Phosphatase: 85 U/L (ref 38–126)
Anion gap: 10 (ref 5–15)
BUN: 10 mg/dL (ref 8–23)
CO2: 26 mmol/L (ref 22–32)
Calcium: 9.8 mg/dL (ref 8.9–10.3)
Chloride: 105 mmol/L (ref 98–111)
Creatinine: 1.03 mg/dL (ref 0.61–1.24)
GFR, Est AFR Am: >60 mL/min (ref >60)
GFR, Estimated: >60 mL/min (ref >60)
Glucose, Bld: 135 mg/dL — ABNORMAL HIGH (ref 70–99)
Potassium: 4.2 mmol/L (ref 3.5–5.1)
Sodium: 141 mmol/L (ref 135–145)
Total Bilirubin: 0.5 mg/dL (ref 0.3–1.2)
Total Protein: 6.8 g/dL (ref 6.5–8.1)

## 2018-02-02 LAB — CBC WITH DIFFERENTIAL/PLATELET
Basophils Absolute: 0.1 10*3/uL (ref 0.0–0.1)
Basophils Relative: 1 %
Eosinophils Absolute: 0 10*3/uL (ref 0.0–0.5)
Eosinophils Relative: 0 %
HCT: 37.4 % — ABNORMAL LOW (ref 38.4–49.9)
Hemoglobin: 12.4 g/dL — ABNORMAL LOW (ref 13.0–17.1)
Lymphocytes Relative: 18 %
Lymphs Abs: 1.1 10*3/uL (ref 0.9–3.3)
MCH: 29.6 pg (ref 27.2–33.4)
MCHC: 33.3 g/dL (ref 32.0–36.0)
MCV: 89 fL (ref 79.3–98.0)
Monocytes Absolute: 0.7 10*3/uL (ref 0.1–0.9)
Monocytes Relative: 11 %
Neutro Abs: 4.2 10*3/uL (ref 1.5–6.5)
Neutrophils Relative %: 70 %
Platelets: 213 10*3/uL (ref 140–400)
RBC: 4.2 MIL/uL (ref 4.20–5.82)
RDW: 14.1 % (ref 11.0–14.6)
WBC: 6.1 10*3/uL (ref 4.0–10.3)

## 2018-02-02 LAB — LACTATE DEHYDROGENASE: LDH: 213 U/L — ABNORMAL HIGH (ref 98–192)

## 2018-02-02 LAB — URIC ACID: Uric Acid, Serum: 4.5 mg/dL (ref 3.7–8.6)

## 2018-02-02 LAB — PHOSPHORUS: Phosphorus: 4.6 mg/dL (ref 2.5–4.6)

## 2018-02-02 MED ORDER — MUPIROCIN 2 % EX OINT: 1.0000 "application " | TOPICAL_OINTMENT | Freq: Two times a day (BID) | CUTANEOUS | 0 refills | Status: DC

## 2018-02-02 NOTE — Progress Notes (Signed)
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 male is here because of left lower extremity edema and lymphadenopathy.  The patient was seen in the emergency room this past Friday for the same issue.  A CT of the abdomen and pelvis was performed showing bulky left inguinal, left hemipelvic, and retroperitoneal adenopathy.  He was referred to [place] from the emergency room for further evaluation.  Doppler ultrasound of the left lower extremity was performed and was negative for DVT. Patient reports that he has been having left lower extremity edema in his left groin and left leg for approximately 1 month.  He states that the swelling in the left groin started to get better but then worsened.  The left lower extremity edema has slowly worsened over time.  Patient denies having fevers and chills.  He reports that he does have night sweats at times.  He reported having headaches approximate 1 [place]  He thinks his headaches are related to not having his blood pressure medication.  His headaches have now resolved.  He denies visual changes.  The patient denies chest pain, shortness of breath and cough.  No nausea, vomiting, constipation, diarrhea.  Denies abdominal pain.  The patient denies recent weight loss and has actually gained weight recently.  Patient denies epistaxis, bleeding gums, hemoptysis, hematuria, but occasionally, and melena.  He reports increased urinary frequency over the past month but no dysuria.  The patient is here for evaluation and discussion of his recent CT and lab findings.  Interval History:   Jesus [name] returns today for management and  evaluation of his newly diagnosed T-Cell/histocyte rich Large B-Cell Lymphoma. The patient's last visit with [place] was on 01/26/18. [name].   The pt reports that his mild tingling in his finger tips has not increased or become more bothersome. He notes that his leg swelling has significantly reduced, and he has lost weight in the interim. He notes that he has continued to eat very well.   The pt notes that he had one day of bone pains, which resolved on their own and he used tylenol.   The pt adds that he has developed a small sore under his nose but denies any concerns for infections, fevers, or chills.   Lab results today (02/02/18) of CBC w/diff, CMP is as follows: all values are WNL except for HGB at 12.4, HCT at 37.4, Glucose at 135, AST at 13. 02/02/18 LDH at 213 02/02/18 Uric acid at 4.5 02/02/18 Phosphorous is WNL at 4.6  On review of systems, pt reports weight loss, decreased leg swelling, eating well, good energy levels, keeping active, and denies bone pains, abdominal pains changes in bowel habits, fevers, chills, night sweats, concerns for infections, mouth sores, abdominal pains, and any other symptoms.    MEDICAL HISTORY:  Past Medical History:  Diagnosis Date  . Allergy   . History of kidney stones   . Hyperlipidemia   . Hypertension   . Lymphadenopathy   . Pain, lower leg    Bilateral  . Peripheral arterial disease ([place])   .  Pre-diabetes   . Red-green color blindness   . Snores   . Wears glasses     SURGICAL HISTORY: Past Surgical History:  Procedure Laterality Date  . CATARACT EXTRACTION W/ INTRAOCULAR LENS  IMPLANT, BILATERAL    . COLONOSCOPY    . dislodged salava stone    . FRACTURE SURGERY    . HAND ARTHROPLASTY  1995   crushed left hand  . INGUINAL LYMPH NODE BIOPSY Left 01/02/2018   Procedure: LEFT INGUINAL LYMPH NODE BIOPSY;  Surgeon: Rolm Bookbinder, MD;  Location: [facility];  Service: General;  Laterality: Left;  . IR  IMAGING GUIDED PORT INSERTION  01/15/2018  . MICROLARYNGOSCOPY Left 01/17/2014   Procedure: MICROLARYNGOSCOPY WITH EXCISION OF THE BIOPSY OF LEFT VOCAL CORD LESION;  Surgeon: Izora Gala, MD;  Location: [facility];  Service: ENT;  Laterality: Left;  . ORIF FOOT FRACTURE  2005   left    SOCIAL HISTORY: Social History   Socioeconomic History  . Marital status: Divorced    Spouse name: Not on file  . Number of children: 3  . Years of education: Not on file  . Highest education level: Not on file  Occupational History  . Not on file  Social Needs  . Financial resource strain: Not on file  . Food insecurity:    Worry: Not on file    Inability: Not on file  . Transportation needs:    Medical: Not on file    Non-medical: Not on file  Tobacco Use  . Smoking status: Current Every Day Smoker    Packs/day: 0.50    Years: 36.00    Pack years: 18.00    Types: Cigarettes  . Smokeless tobacco: Never Used  Substance and Sexual Activity  . Alcohol use: Yes    Alcohol/week: 15.0 standard drinks    Types: 10 Cans of beer, 5 Shots of liquor per week    Comment: weekends  . Drug use: Yes    Types: Cocaine    Comment: reports cocaine usage ~2X/ month; last use 12/26/17  . Sexual activity: Not on file  Lifestyle  . Physical activity:    Days per week: Not on file    Minutes per session: Not on file  . Stress: Not on file  Relationships  . Social connections:    Talks on phone: Not on file    Gets together: Not on file    Attends religious service: Not on file    Active member of club or organization: Not on file    Attends meetings of clubs or organizations: Not on file    Relationship status: Not on file  . Intimate partner violence:    Fear of current or ex partner: Not on file    Emotionally abused: Not on file    Physically abused: Not on file    Forced sexual activity: Not on file  Other Topics Concern  . Not on file  Social History Narrative  . Not on file     FAMILY HISTORY: Family History  Problem Relation Age of Onset  . Breast cancer Mother   . Diabetes Father   . Hypertension Father   . Stroke Father   . Mental illness Sister   . Hypertension Daughter   . Mental illness Daughter   . Hypertension Brother   . Colon cancer Brother   . Breast cancer Sister     ALLERGIES:  is allergic to bee venom.  MEDICATIONS:  Current Outpatient Medications  Medication Sig Dispense Refill  . allopurinol (ZYLOPRIM) 100 MG tablet Take 1 tablet (100 mg total) by mouth 2 (two) times daily. 60 tablet 0  . amLODipine (NORVASC) 10 MG tablet Take 10 mg by mouth daily.  1  . aspirin 81 MG tablet Take 81 mg by mouth daily.    Marland Kitchen CIALIS 20 MG tablet Take 20 mg by mouth daily as needed for erectile dysfunction.   0  . dexamethasone (DECADRON) 4 MG tablet Take 2 tablets (8 mg total) by mouth 2 (two) times daily with a meal. Take two times a day starting the day after chemotherapy for 3 days. 30 tablet 1  . hydrocortisone (ANUSOL-HC) 2.5 % rectal cream Place 1 application rectally 2 (two) times daily as needed for hemorrhoids. 30 g 0  . lisinopril (PRINIVIL,ZESTRIL) 20 MG tablet Take 20 mg by mouth daily.    Marland Kitchen LORazepam (ATIVAN) 0.5 MG tablet Take 1 tablet (0.5 mg total) by mouth every 6 (six) hours as needed (Nausea or vomiting). 30 tablet 0  . Multiple Vitamins-Minerals (MULTIVITAMIN WITH MINERALS) tablet Take 1 tablet by mouth daily.    . ondansetron (ZOFRAN) 8 MG tablet Take 1 tablet (8 mg total) by mouth every 8 (eight) hours as needed for nausea or vomiting. 20 tablet 0  . prochlorperazine (COMPAZINE) 10 MG tablet Take 1 tablet (10 mg total) by mouth every 6 (six) hours as needed (Nausea or vomiting). 30 tablet 1  . traMADol (ULTRAM) 50 MG tablet Take 2 tablets (100 mg total) by mouth every 6 (six) hours as needed. (Patient taking differently: Take 100 mg by mouth every 6 (six) hours as needed for moderate pain. ) 10 tablet 0   No current  facility-administered medications for this visit.     REVIEW OF SYSTEMS:    A 10+ POINT REVIEW OF SYSTEMS WAS OBTAINED including neurology, dermatology, psychiatry, cardiac, respiratory, lymph, extremities, GI, GU, Musculoskeletal, constitutional, breasts, reproductive, HEENT.  All pertinent positives are noted in the HPI.  All others are negative.   PHYSICAL EXAMINATION: ECOG PERFORMANCE STATUS: 1 - Symptomatic but completely ambulatory  Vitals:   02/02/18 1211  BP: 123/71  Pulse: 86  Resp: 18  Temp: 98.1 F (36.7 C)  SpO2: 100%   Filed Weights   02/02/18 1211  Weight: 286 lb (129.7 kg)    GENERAL:alert, in no acute distress and comfortable SKIN: no acute rashes, no significant lesions EYES: conjunctiva are pink and non-injected, sclera anicteric OROPHARYNX: MMM, no exudates, no oropharyngeal erythema or ulceration NECK: supple, no JVD LYMPH:  no palpable lymphadenopathy in the cervical, axillary or inguinal regions LUNGS: clear to auscultation b/l with normal respiratory effort HEART: regular rate & rhythm ABDOMEN:  normoactive bowel sounds , non tender, not distended. No palpable hepatosplenomegaly.  Extremity: no pedal edema PSYCH: alert & oriented x 3 with fluent speech NEURO: no focal motor/sensory deficits   LABORATORY DATA:  I have reviewed the data as listed CBC Latest Ref Rng & Units 02/02/2018 01/26/2018 01/19/2018  WBC 4.0 - 10.3 K/uL 6.1 3.4(L) 4.6  Hemoglobin 13.0 - 17.1 g/dL 12.4(L) 11.0(L) 12.6(L)  Hematocrit 38.4 - 49.9 % 37.4(L) 33.9(L) 37.7(L)  Platelets 140 - 400 K/uL 213 125(L) 261    CMP Latest Ref Rng & Units 02/02/2018 01/26/2018 01/19/2018  Glucose 70 - 99 mg/dL 135(H) 156(H) 121(H)  BUN 8 - 23 mg/dL 10 15 19   Creatinine 0.61 - 1.24 mg/dL 1.03 0.90 0.91  Sodium 135 - 145 mmol/L 141 143 142  Potassium 3.5 - 5.1 mmol/L 4.2 3.5 3.5  Chloride 98 - 111 mmol/L 105 103 104  CO2 22 - 32 mmol/L 26 31 29   Calcium 8.9 - 10.3 mg/dL 9.8 8.7(L) 9.2  Total  Protein 6.5 - 8.1 g/dL 6.8 5.6(L) 5.9(L)  Total Bilirubin 0.3 - 1.2 mg/dL 0.5 0.4 0.8  Alkaline Phos 38 - 126 U/L 85 62 43  AST 15 - 41 U/L 13(L) 9(L) 15  ALT 0 - 44 U/L 19 20 18    827/19 Left Inguinal LN Bx:   12/26/17 Tissue Flow Cytometry:   12/26/17 Inguinal Core biopsy:    RADIOGRAPHIC STUDIES: I have personally reviewed the radiological images as listed and agreed with the findings in the report. Nm Pet Image Initial (pi) Skull Base To Thigh  Result Date: 01/07/2018 CLINICAL DATA:  Initial treatment strategy for B-cell lymphoma. EXAM: NUCLEAR MEDICINE PET SKULL BASE TO THIGH TECHNIQUE: 14.9 mCi F-18 FDG was injected intravenously. Full-ring PET imaging was performed from the skull base to thigh after the radiotracer. CT data was obtained and used for attenuation correction and anatomic localization. Fasting blood glucose: 98 mg/dl COMPARISON:  CT 12/22/2017 FINDINGS: Mediastinal blood pool activity: SUV max 2.17 NECK: No hypermetabolic lymph nodes in the neck. Incidental CT findings: none CHEST: Small hypermetabolic lymph node in the LEFT upper mediastinum adjacent to the LEFT subclavian artery measures only 6 mm but has intense metabolic activity SUV max equal 10.9. No additional mediastinal adenopathy no suspicious pulmonary nodularity. Incidental CT findings: none ABDOMEN/PELVIS: Intense hypermetabolic adenopathy in the porta hepatis. The nodes are mildly enlarged at 15 mm but with intense metabolic activity (SUV max equal 27.2). Hypermetabolic adenopathy in the retroperitoneum LEFT of the aorta measures 3.5 cm with SUV max equal 36.9. Adenopathy extends along the LEFT iliac vessels. Massively enlarged LEFT external iliac lymph node with some central necrosis measures 8.0 x 12.5 cm and with SUV max equal 36.7. Enlarged LEFT inguinal lymph node with SUV max equal 38. No abnormal activity in the liver. The spleen is normal size without metabolic activity. No abnormal activity in the bowel.  The bladder is displaced rightward by the a LEFT pelvic adenopathy. Incidental CT findings: Multiple benign-appearing cysts within the kidneys SKELETON: There several discrete metabolic foci within the skeleton without clear CT correlation. For example lesion in the LEFT sacral ala with SUV max equal 12.3. Lesion in theupper thoracic spine with SUV max equal 23.9. Lesion inferior body of LEFT scapula with SUV max equal 8.5 Incidental CT findings: none IMPRESSION: 1. Massively enlarged pelvic lymph nodes intense metabolic activity consistent lymphoma. Additional 2. Additional hypermetabolic lymph nodes in the porta hepatis and retroperitoneum LEFT aorta. 3. Solitary hypermetabolic mediastinal lymph node in the upper LEFT mediastinum. 4. 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 5. Normal spleen. Electronically Signed   By: Suzy Bouchard M.D.   On: 01/07/2018 10:55   Ir Imaging Guided Port Insertion  Result Date: 01/15/2018 INDICATION: 62 year old male with diffuse large B-cell lymphoma in need of durable venous access to begin chemotherapy. EXAM: IMPLANTED PORT A CATH PLACEMENT WITH ULTRASOUND AND FLUOROSCOPIC GUIDANCE MEDICATIONS: 2 g Ancef; The antibiotic was administered within an appropriate time interval prior to skin puncture. ANESTHESIA/SEDATION: Versed 2 mg IV; Fentanyl 100 mcg IV; Moderate Sedation Time:  19 minutes The patient was continuously monitored during the procedure by the interventional radiology nurse under my direct supervision. FLUOROSCOPY TIME:  0 minutes, 24 seconds (20 mGy) COMPLICATIONS: None immediate. PROCEDURE: The right  neck and chest was prepped with chlorhexidine, and draped in the usual sterile fashion using maximum barrier technique (cap and mask, sterile gown, sterile gloves, large sterile sheet, hand hygiene and cutaneous antiseptic). Antibiotic prophylaxis was provided with 2g Ancef administered IV one hour prior to skin incision.  Local anesthesia was attained by infiltration with 1% lidocaine with epinephrine. Ultrasound demonstrated patency of the right internal jugular vein, and this was documented with an image. Under real-time ultrasound guidance, this vein was accessed with a 21 gauge micropuncture needle and image documentation was performed. A small dermatotomy was made at the access site with an 11 scalpel. A 0.018" wire was advanced into the SVC and the access needle exchanged for a 66F micropuncture vascular sheath. The 0.018" wire was then removed and a 0.035" wire advanced into the IVC. An appropriate location for the subcutaneous reservoir was selected below the clavicle and an incision was made through the skin and underlying soft tissues. The subcutaneous tissues were then dissected using a combination of blunt and sharp surgical technique and a pocket was formed. A single lumen power injectable portacatheter was then tunneled through the subcutaneous tissues from the pocket to the dermatotomy and the port reservoir placed within the subcutaneous pocket. The venous access site was then serially dilated and a peel away vascular sheath placed over the wire. The wire was removed and the port catheter advanced into position under fluoroscopic guidance. The catheter tip is positioned in the superior cavoatrial junction. This was documented with a spot image. The portacatheter was then tested and found to flush and aspirate well. The port was flushed with saline followed by 100 units/mL heparinized saline. The pocket was then closed in two layers using first subdermal inverted interrupted absorbable sutures followed by a running subcuticular suture. The epidermis was then sealed with Dermabond. The dermatotomy at the venous access site was also closed with Dermabond. IMPRESSION: Successful placement of a right IJ approach Power Port with ultrasound and fluoroscopic guidance. The catheter is ready for use. Electronically Signed   By:  [name] M.D.   On: 01/15/2018 16:49    ASSESSMENT & PLAN:   This is a pleasant 62 y.o. 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  CT of the abdomen and pelvis performed on 12/22/2017 showed bulky left inguinal, left hemipelvic, and retroperitoneal adenopathy.   01/02/18 Left inguinal LN Biopsy revealed T-Cell/histocyte rich Large B-Cell Lymphoma  12/27/17 ECHO revealed LV EF of 55-60%   01/05/18 PET/CT revealedMassively enlarged pelvic lymph nodes intense metabolic activity consistent lymphoma. 2. Additional hypermetabolic lymph nodes in the porta hepatis and retroperitoneum LEFT aorta. 3. Solitary hypermetabolic mediastinal lymph node in the upper LEFT Mediastinum. 4. 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 5. Normal spleen.  HIV non reactive on 12/22/2017.Hep C and hep B serology negative.  2) left lower extremity swelling-stable Doppler ultrasound for DVT was negative in the left lower extremity.  Likely from venous compression +/- lymphatic obstruction from bulky left inguinal, left hemipelvic, and retroperitoneal adenopathy.   3) Intermediate to high risk for tumor lysis syndrome.  4) S/p Port a cath placement   PLAN:  -Pt tolerated C1 of EPOCH-R very well -Recommended that the pt wear his compression socks -Hold off on dental cleanings and interventions unless an acute  issue such as a dental abscess occurs   .  -Discussed pt labwork today, 02/02/18; Uric acid is WNL, LDH at 213. Blood counts and chemistries are stable.  -The pt has no prohibitive toxicities from beginning C2 of EPOCH-R with G-CSF support at this time.   -Okay to hold  Allopurinol at this time without concern for tumor lysis syndrome  -Will use topical antibiotic/mupirocin for small blister under nose -Recommended that the pt continue to eat well, drink at least 48-64 oz of water each day, and walk 20-30 minutes each day.  -Will see the pt on Monday 02/05/18 for admission for C2 of EPOCH-R with G-CSF support   5) DVT prophylaxis -lovenox, SCD, ambulation.  6) HTN -Continue outpatient antihypertensives and monitor   7) Mild hemorrhoidal bleeding. hgb stable. Prn anusol   No orders of the defined types were placed in this encounter.   - [facility] admission for [facility] for 5 days from 02/05/2018 (Monday through Friday) -Neulasta and Rituxan on 02/12/2018 -RTC with Dr Irene Limbo with labs in 18 days.    All questions were answered. The patient knows to call the clinic with any problems, questions or concerns.  The total time spent in the appt was 25 minutes and more than 50% was on counseling and direct patient cares.    Sullivan Lone MD MS AAHIVMS [facility] Hematology/Oncology Physician [facility]  (Office):       [phone] (Work cell):  [phone] (Fax):           [phone]  I, Baldwin [place], am acting as a scribe for Dr. Irene Limbo  .I have reviewed the above documentation for accuracy and completeness, and I agree with the above. Brunetta Genera MD

## 2018-02-02 NOTE — Telephone Encounter (Signed)
Left a detailed voice msg concerning the patient upcoming appointment. Per 9/27 los. Also mailed a letter with a calender enclosed

## 2018-02-03 ENCOUNTER — Other Ambulatory Visit: Payer: Self-pay | Admitting: Hematology

## 2018-02-05 ENCOUNTER — Inpatient Hospital Stay (HOSPITAL_COMMUNITY)
Admission: AD | Admit: 2018-02-05 | Discharge: 2018-02-09 | DRG: 841 | Disposition: A | Discharge status: Home / Self Care | Payer: 59 | Attending: Hematology | Admitting: Hematology

## 2018-02-05 ENCOUNTER — Other Ambulatory Visit: Payer: Self-pay

## 2018-02-05 ENCOUNTER — Encounter (HOSPITAL_COMMUNITY): Payer: Self-pay

## 2018-02-05 DIAGNOSIS — I10 Essential (primary) hypertension: Secondary | ICD-10-CM | POA: Diagnosis not present

## 2018-02-05 DIAGNOSIS — H5350 Unspecified color vision deficiencies: Secondary | ICD-10-CM | POA: Diagnosis present

## 2018-02-05 DIAGNOSIS — Z818 Family history of other mental and behavioral disorders: Secondary | ICD-10-CM | POA: Diagnosis not present

## 2018-02-05 DIAGNOSIS — Z9842 Cataract extraction status, left eye: Secondary | ICD-10-CM | POA: Diagnosis not present

## 2018-02-05 DIAGNOSIS — C8330 Diffuse large B-cell lymphoma, unspecified site: Secondary | ICD-10-CM | POA: Diagnosis not present

## 2018-02-05 DIAGNOSIS — C8338 Diffuse large B-cell lymphoma, lymph nodes of multiple sites: Secondary | ICD-10-CM

## 2018-02-05 DIAGNOSIS — M7989 Other specified soft tissue disorders: Secondary | ICD-10-CM | POA: Diagnosis present

## 2018-02-05 DIAGNOSIS — R7303 Prediabetes: Secondary | ICD-10-CM | POA: Diagnosis present

## 2018-02-05 DIAGNOSIS — Z833 Family history of diabetes mellitus: Secondary | ICD-10-CM | POA: Diagnosis not present

## 2018-02-05 DIAGNOSIS — R202 Paresthesia of skin: Secondary | ICD-10-CM | POA: Diagnosis not present

## 2018-02-05 DIAGNOSIS — K649 Unspecified hemorrhoids: Secondary | ICD-10-CM | POA: Diagnosis present

## 2018-02-05 DIAGNOSIS — Z91030 Bee allergy status: Secondary | ICD-10-CM

## 2018-02-05 DIAGNOSIS — Z800 Family history of malignant neoplasm of digestive organs: Secondary | ICD-10-CM

## 2018-02-05 DIAGNOSIS — R600 Localized edema: Secondary | ICD-10-CM | POA: Diagnosis not present

## 2018-02-05 DIAGNOSIS — I871 Compression of vein: Secondary | ICD-10-CM | POA: Diagnosis not present

## 2018-02-05 DIAGNOSIS — Z8249 Family history of ischemic heart disease and other diseases of the circulatory system: Secondary | ICD-10-CM

## 2018-02-05 DIAGNOSIS — R590 Localized enlarged lymph nodes: Secondary | ICD-10-CM | POA: Diagnosis present

## 2018-02-05 DIAGNOSIS — Z96698 Presence of other orthopedic joint implants: Secondary | ICD-10-CM | POA: Diagnosis present

## 2018-02-05 DIAGNOSIS — I739 Peripheral vascular disease, unspecified: Secondary | ICD-10-CM | POA: Diagnosis present

## 2018-02-05 DIAGNOSIS — Z87442 Personal history of urinary calculi: Secondary | ICD-10-CM

## 2018-02-05 DIAGNOSIS — Z9841 Cataract extraction status, right eye: Secondary | ICD-10-CM

## 2018-02-05 DIAGNOSIS — Z823 Family history of stroke: Secondary | ICD-10-CM

## 2018-02-05 DIAGNOSIS — Z5111 Encounter for antineoplastic chemotherapy: Secondary | ICD-10-CM | POA: Diagnosis not present

## 2018-02-05 DIAGNOSIS — E785 Hyperlipidemia, unspecified: Secondary | ICD-10-CM | POA: Diagnosis present

## 2018-02-05 DIAGNOSIS — Z961 Presence of intraocular lens: Secondary | ICD-10-CM | POA: Diagnosis present

## 2018-02-05 DIAGNOSIS — Z803 Family history of malignant neoplasm of breast: Secondary | ICD-10-CM | POA: Diagnosis not present

## 2018-02-05 DIAGNOSIS — F17210 Nicotine dependence, cigarettes, uncomplicated: Secondary | ICD-10-CM | POA: Diagnosis present

## 2018-02-05 DIAGNOSIS — Z973 Presence of spectacles and contact lenses: Secondary | ICD-10-CM

## 2018-02-05 DIAGNOSIS — C7951 Secondary malignant neoplasm of bone: Secondary | ICD-10-CM | POA: Diagnosis not present

## 2018-02-05 DIAGNOSIS — Z7189 Other specified counseling: Secondary | ICD-10-CM

## 2018-02-05 LAB — PHOSPHORUS: Phosphorus: 3.2 mg/dL (ref 2.5–4.6)

## 2018-02-05 LAB — COMPREHENSIVE METABOLIC PANEL WITH GFR
ALT: 18 U/L (ref 0–44)
AST: 18 U/L (ref 15–41)
Albumin: 3.9 g/dL (ref 3.5–5.0)
Alkaline Phosphatase: 60 U/L (ref 38–126)
Anion gap: 9 (ref 5–15)
BUN: 16 mg/dL (ref 8–23)
CO2: 26 mmol/L (ref 22–32)
Calcium: 9.3 mg/dL (ref 8.9–10.3)
Chloride: 111 mmol/L (ref 98–111)
Creatinine, Ser: 1.05 mg/dL (ref 0.61–1.24)
GFR calc Af Amer: >60 mL/min
GFR calc non Af Amer: >60 mL/min
Glucose, Bld: 123 mg/dL — ABNORMAL HIGH (ref 70–99)
Potassium: 4.2 mmol/L (ref 3.5–5.1)
Sodium: 146 mmol/L — ABNORMAL HIGH (ref 135–145)
Total Bilirubin: 0.7 mg/dL (ref 0.3–1.2)
Total Protein: 6.7 g/dL (ref 6.5–8.1)

## 2018-02-05 LAB — CBC WITH DIFFERENTIAL/PLATELET
Basophils Absolute: 0 10*3/uL (ref 0.0–0.1)
Basophils Relative: 1 %
Eosinophils Absolute: 0 10*3/uL (ref 0.0–0.7)
Eosinophils Relative: 1 %
HCT: 35.2 % — ABNORMAL LOW (ref 39.0–52.0)
Hemoglobin: 11.6 g/dL — ABNORMAL LOW (ref 13.0–17.0)
Lymphocytes Relative: 25 %
Lymphs Abs: 1 10*3/uL (ref 0.7–4.0)
MCH: 30 pg (ref 26.0–34.0)
MCHC: 33 g/dL (ref 30.0–36.0)
MCV: 91 fL (ref 78.0–100.0)
Monocytes Absolute: 0.5 10*3/uL (ref 0.1–1.0)
Monocytes Relative: 11 %
Neutro Abs: 2.6 10*3/uL (ref 1.7–7.7)
Neutrophils Relative %: 62 %
Platelets: 360 10*3/uL (ref 150–400)
RBC: 3.87 MIL/uL — ABNORMAL LOW (ref 4.22–5.81)
RDW: 14.6 % (ref 11.5–15.5)
WBC: 4.2 10*3/uL (ref 4.0–10.5)

## 2018-02-05 LAB — MAGNESIUM: Magnesium: 2.2 mg/dL (ref 1.7–2.4)

## 2018-02-05 MED ORDER — SODIUM CHLORIDE 0.9 % IV SOLN
INTRAVENOUS | Status: DC
  Administered 2018-02-05: 16:00:00 via INTRAVENOUS

## 2018-02-05 MED ORDER — COLD PACK MISC ONCOLOGY
1.0000 | Freq: Once | Status: DC | PRN
  Filled 2018-02-05: qty 1

## 2018-02-05 MED ORDER — ACETAMINOPHEN 325 MG PO TABS: 650.0000 mg | ORAL_TABLET | ORAL | Status: DC | PRN

## 2018-02-05 MED ORDER — SODIUM CHLORIDE 0.9% FLUSH: 3.0000 mL | INTRAVENOUS | Status: DC | PRN

## 2018-02-05 MED ORDER — LISINOPRIL 20 MG PO TABS
20.0000 mg | ORAL_TABLET | Freq: Every day | ORAL | Status: DC
  Administered 2018-02-06 – 2018-02-09 (×4): 20 mg via ORAL
  Filled 2018-02-05 (×4): qty 1

## 2018-02-05 MED ORDER — ASPIRIN EC 81 MG PO TBEC
81.0000 mg | DELAYED_RELEASE_TABLET | Freq: Every day | ORAL | Status: DC
  Administered 2018-02-06 – 2018-02-09 (×4): 81 mg via ORAL
  Filled 2018-02-05 (×4): qty 1

## 2018-02-05 MED ORDER — VINCRISTINE SULFATE CHEMO INJECTION 1 MG/ML
Freq: Once | INTRAVENOUS | Status: AC
  Administered 2018-02-05: 16:00:00 via INTRAVENOUS
  Filled 2018-02-05: qty 13

## 2018-02-05 MED ORDER — AMLODIPINE BESYLATE 10 MG PO TABS
10.0000 mg | ORAL_TABLET | Freq: Every day | ORAL | Status: DC
  Administered 2018-02-06 – 2018-02-09 (×4): 10 mg via ORAL
  Filled 2018-02-05 (×4): qty 1

## 2018-02-05 MED ORDER — HYDROCORTISONE 2.5 % RE CREA: 1.0000 "application " | TOPICAL_CREAM | Freq: Two times a day (BID) | RECTAL | Status: DC | PRN

## 2018-02-05 MED ORDER — ENOXAPARIN SODIUM 40 MG/0.4ML ~~LOC~~ SOLN
40.0000 mg | SUBCUTANEOUS | Status: DC
  Administered 2018-02-05: 40 mg via SUBCUTANEOUS
  Filled 2018-02-05 (×3): qty 0.4

## 2018-02-05 MED ORDER — LORAZEPAM 0.5 MG PO TABS: 0.5000 mg | ORAL_TABLET | Freq: Four times a day (QID) | ORAL | Status: DC | PRN

## 2018-02-05 MED ORDER — PREDNISONE 20 MG PO TABS
60.0000 mg | ORAL_TABLET | Freq: Every day | ORAL | Status: AC
  Administered 2018-02-05 – 2018-02-09 (×5): 60 mg via ORAL
  Filled 2018-02-05 (×5): qty 3

## 2018-02-05 MED ORDER — SODIUM CHLORIDE 0.9% FLUSH: 10.0000 mL | INTRAVENOUS | Status: DC | PRN

## 2018-02-05 MED ORDER — PREDNISONE 20 MG PO TABS: 60.0000 mg | ORAL_TABLET | Freq: Every day | ORAL | Status: DC

## 2018-02-05 MED ORDER — ALTEPLASE 2 MG IJ SOLR
2.0000 mg | Freq: Once | INTRAMUSCULAR | Status: DC | PRN
  Filled 2018-02-05: qty 2

## 2018-02-05 MED ORDER — MUPIROCIN 2 % EX OINT
1.0000 "application " | TOPICAL_OINTMENT | Freq: Two times a day (BID) | CUTANEOUS | Status: DC
  Administered 2018-02-05 – 2018-02-09 (×7): 1 via NASAL
  Filled 2018-02-05 (×2): qty 22

## 2018-02-05 MED ORDER — SENNOSIDES-DOCUSATE SODIUM 8.6-50 MG PO TABS: 1.0000 | ORAL_TABLET | Freq: Every evening | ORAL | Status: DC | PRN

## 2018-02-05 MED ORDER — SODIUM CHLORIDE 0.9 % IV SOLN
Freq: Once | INTRAVENOUS | Status: AC
  Administered 2018-02-05: 8 mg via INTRAVENOUS
  Filled 2018-02-05: qty 4

## 2018-02-05 MED ORDER — HEPARIN SOD (PORK) LOCK FLUSH 100 UNIT/ML IV SOLN: 250.0000 [IU] | Freq: Once | INTRAVENOUS | Status: DC | PRN

## 2018-02-05 MED ORDER — HOT PACK MISC ONCOLOGY
1.0000 | Freq: Once | Status: DC | PRN
  Filled 2018-02-05: qty 1

## 2018-02-05 MED ORDER — HEPARIN SOD (PORK) LOCK FLUSH 100 UNIT/ML IV SOLN
500.0000 [IU] | Freq: Once | INTRAVENOUS | Status: DC | PRN
  Filled 2018-02-05: qty 5

## 2018-02-05 NOTE — Progress Notes (Signed)
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 male who has been admitted today for C2 EPOCH-R treatment of his T-Cel/histocyte rich Large B-Cell Lymphoma. I last saw the pt in clinic on 02/02/18. [name].   The pt reports that he has developed no new concerns in the interim and notes that he is ready to begin C2 EPOCH-R. He denies any concerns for infections, and endorses stable energy levels. He notes that his leg swelling has continued to improve. He denies any constitutional symptoms, skin rashes, or abdominal pains. He adds that his feelings of abdominal fullness have subsided as well.   Lab results today (02/05/18) of CBC w/diff, CMP is as follows: all values are WNL except for RBC at 3.87, HGB at 11.6, HCT at 35.2, Sodium at 146, Glucose at 123.  On review of systems, pt reports improving leg swelling, good energy levels, moving his bowels, and denies abdominal pains, abdominal fullness, skin rashes, fevers, chills, night sweats, concerns for infections and any other symptoms.   MEDICAL HISTORY:  Past Medical History:  Diagnosis Date  . Allergy   . History of kidney stones   . Hyperlipidemia   . Hypertension   . Lymphadenopathy   . Pain, lower leg    Bilateral  . Peripheral arterial disease ([place])   . Pre-diabetes   . Red-green color blindness   . Snores   . Wears glasses     SURGICAL HISTORY: Past Surgical History:  Procedure Laterality Date  . CATARACT EXTRACTION W/ INTRAOCULAR LENS  IMPLANT, BILATERAL    . COLONOSCOPY    . dislodged salava stone    . FRACTURE SURGERY    . HAND ARTHROPLASTY  1995   crushed left hand  . INGUINAL LYMPH NODE BIOPSY Left  01/02/2018   Procedure: LEFT INGUINAL LYMPH NODE BIOPSY;  Surgeon: Rolm Bookbinder, MD;  Location: [facility];  Service: General;  Laterality: Left;  . IR IMAGING GUIDED PORT INSERTION  01/15/2018  . MICROLARYNGOSCOPY Left 01/17/2014   Procedure: MICROLARYNGOSCOPY WITH EXCISION OF THE BIOPSY OF LEFT VOCAL CORD LESION;  Surgeon: Izora Gala, MD;  Location: [facility];  Service: ENT;  Laterality: Left;  . ORIF FOOT FRACTURE  2005   left    SOCIAL HISTORY: Social History   Socioeconomic History  . Marital status: Divorced    Spouse name: Not on file  . Number of children: 3  . Years of education: Not on file  . Highest education level: Not on file  Occupational History  . Not on file  Social Needs  . Financial resource strain: Not on file  . Food insecurity:    Worry: Not on file    Inability: Not on file  . Transportation needs:    Medical: Not on file    Non-medical: Not on file  Tobacco Use  . Smoking status: Current Every Day Smoker    Packs/day: 0.50    Years: 36.00    Pack years: 18.00    Types: Cigarettes  . Smokeless tobacco: Never Used  Substance and Sexual Activity  . Alcohol use: Yes    Alcohol/week: 15.0 standard drinks    Types: 10 Cans of beer, 5 Shots  of liquor per week    Comment: weekends  . Drug use: Yes    Types: Cocaine    Comment: reports cocaine usage ~2X/ month; last use 12/26/17  . Sexual activity: Not on file  Lifestyle  . Physical activity:    Days per week: Not on file    Minutes per session: Not on file  . Stress: Not on file  Relationships  . Social connections:    Talks on phone: Not on file    Gets together: Not on file    Attends religious service: Not on file    Active member of club or organization: Not on file    Attends meetings of clubs or organizations: Not on file    Relationship status: Not on file  . Intimate partner violence:    Fear of current or ex partner: Not on file    Emotionally abused: Not on file     Physically abused: Not on file    Forced sexual activity: Not on file  Other Topics Concern  . Not on file  Social History Narrative  . Not on file    FAMILY HISTORY: Family History  Problem Relation Age of Onset  . Breast cancer Mother   . Diabetes Father   . Hypertension Father   . Stroke Father   . Mental illness Sister   . Hypertension Daughter   . Mental illness Daughter   . Hypertension Brother   . Colon cancer Brother   . Breast cancer Sister     ALLERGIES:  is allergic to bee venom.  MEDICATIONS:  Current Facility-Administered Medications  Medication Dose Route Frequency Provider Last Rate Last Dose  . 0.9 %  sodium chloride infusion   Intravenous Continuous Brunetta Genera, MD 10 mL/hr at 02/05/18 1830    . acetaminophen (TYLENOL) tablet 650 mg  650 mg Oral Q4H PRN Brunetta Genera, MD      . alteplase (CATHFLO ACTIVASE) injection 2 mg  2 mg Intracatheter Once PRN Brunetta Genera, MD      . Derrill Memo ON 02/06/2018] amLODipine (NORVASC) tablet 10 mg  10 mg Oral Daily Brunetta Genera, MD      . Derrill Memo ON 02/06/2018] aspirin EC tablet 81 mg  81 mg Oral Daily Brunetta Genera, MD      . Cold Pack 1 packet  1 packet Topical Once PRN Brunetta Genera, MD      . DOXOrubicin (ADRIAMYCIN) 26 mg, etoposide (VEPESID) 132 mg, vinCRIStine (ONCOVIN) 1.1 mg in sodium chloride 0.9 % 1,000 mL chemo infusion   Intravenous Once Brunetta Genera, MD 51 mL/hr at 02/05/18 1624    . enoxaparin (LOVENOX) injection 40 mg  40 mg Subcutaneous Q24H Brunetta Genera, MD   40 mg at 02/05/18 2116  . heparin lock flush 100 unit/mL  500 Units Intracatheter Once PRN Brunetta Genera, MD      . heparin lock flush 100 unit/mL  250 Units Intracatheter Once PRN Brunetta Genera, MD      . Hot Pack 1 packet  1 packet Topical Once PRN Brunetta Genera, MD      . hydrocortisone (ANUSOL-HC) 2.5 % rectal cream 1 application  1 application Rectal BID PRN Brunetta Genera, MD      . Derrill Memo ON 02/06/2018] lisinopril (PRINIVIL,ZESTRIL) tablet 20 mg  20 mg Oral Daily Brunetta Genera, MD      . LORazepam (ATIVAN) tablet 0.5 mg  0.5 mg  Oral Q6H PRN Brunetta Genera, MD      . mupirocin ointment (BACTROBAN) 2 % 1 application  1 application Nasal BID Brunetta Genera, MD   1 application at 32/44/01 2116  . predniSONE (DELTASONE) tablet 60 mg  60 mg Oral QAC breakfast Brunetta Genera, MD   60 mg at 02/05/18 1554  . senna-docusate (Senokot-S) tablet 1 tablet  1 tablet Oral QHS PRN Brunetta Genera, MD      . sodium chloride flush (NS) 0.9 % injection 10 mL  10 mL Intracatheter PRN Brunetta Genera, MD      . sodium chloride flush (NS) 0.9 % injection 3 mL  3 mL Intracatheter PRN Brunetta Genera, MD        REVIEW OF SYSTEMS:    10 Point review of Systems was done is negative except as noted above.  PHYSICAL EXAMINATION: ECOG PERFORMANCE STATUS: 1 - Symptomatic but completely ambulatory  . Vitals:   02/05/18 1406 02/05/18 2131  BP: 123/72 (!) 141/78  Pulse: 90 94  Resp: 18 18  Temp: 98.1 F (36.7 C) 98.3 F (36.8 C)  SpO2: 99% 98%   Filed Weights   02/05/18 1024  Weight: 281 lb 1.4 oz (127.5 kg)   .Body mass index is 38.12 kg/m.  GENERAL:alert, in no acute distress and comfortable SKIN: no acute rashes, no significant lesions EYES: conjunctiva are pink and non-injected, sclera anicteric OROPHARYNX: MMM, no exudates, no oropharyngeal erythema or ulceration NECK: supple, no JVD LYMPH:  no palpable lymphadenopathy in the cervical, axillary or inguinal regions LUNGS: clear to auscultation b/l with normal respiratory effort HEART: regular rate & rhythm ABDOMEN:  normoactive bowel sounds , non tender, not distended. Extremity: no pedal edema PSYCH: alert & oriented x 3 with fluent speech NEURO: no focal motor/sensory deficits  LABORATORY DATA:  I have reviewed the data as listed  . CBC Latest Ref Rng & Units  02/05/2018 02/02/2018 01/26/2018  WBC 4.0 - 10.5 K/uL 4.2 6.1 3.4(L)  Hemoglobin 13.0 - 17.0 g/dL 11.6(L) 12.4(L) 11.0(L)  Hematocrit 39.0 - 52.0 % 35.2(L) 37.4(L) 33.9(L)  Platelets 150 - 400 K/uL 360 213 125(L)    . CMP Latest Ref Rng & Units 02/05/2018 02/02/2018 01/26/2018  Glucose 70 - 99 mg/dL 123(H) 135(H) 156(H)  BUN 8 - 23 mg/dL 16 10 15   Creatinine 0.61 - 1.24 mg/dL 1.05 1.03 0.90  Sodium 135 - 145 mmol/L 146(H) 141 143  Potassium 3.5 - 5.1 mmol/L 4.2 4.2 3.5  Chloride 98 - 111 mmol/L 111 105 103  CO2 22 - 32 mmol/L 26 26 31   Calcium 8.9 - 10.3 mg/dL 9.3 9.8 8.7(L)  Total Protein 6.5 - 8.1 g/dL 6.7 6.8 5.6(L)  Total Bilirubin 0.3 - 1.2 mg/dL 0.7 0.5 0.4  Alkaline Phos 38 - 126 U/L 60 85 62  AST 15 - 41 U/L 18 13(L) 9(L)  ALT 0 - 44 U/L 18 19 20      RADIOGRAPHIC STUDIES: I have personally reviewed the radiological images as listed and agreed with the findings in the report. Ir Imaging Guided Port Insertion  Result Date: 01/15/2018 INDICATION: 62 year old male with diffuse large B-cell lymphoma in need of durable venous access to begin chemotherapy. EXAM: IMPLANTED PORT A CATH PLACEMENT WITH ULTRASOUND AND FLUOROSCOPIC GUIDANCE MEDICATIONS: 2 g Ancef; The antibiotic was administered within an appropriate time interval prior to skin puncture. ANESTHESIA/SEDATION: Versed 2 mg IV; Fentanyl 100 mcg IV; Moderate Sedation Time:  19 minutes The patient was  continuously monitored during the procedure by the interventional radiology nurse under my direct supervision. FLUOROSCOPY TIME:  0 minutes, 24 seconds (20 mGy) COMPLICATIONS: None immediate. PROCEDURE: The right neck and chest was prepped with chlorhexidine, and draped in the usual sterile fashion using maximum barrier technique (cap and mask, sterile gown, sterile gloves, large sterile sheet, hand hygiene and cutaneous antiseptic). Antibiotic prophylaxis was provided with 2g Ancef administered IV one hour prior to skin incision. Local  anesthesia was attained by infiltration with 1% lidocaine with epinephrine. Ultrasound demonstrated patency of the right internal jugular vein, and this was documented with an image. Under real-time ultrasound guidance, this vein was accessed with a 21 gauge micropuncture needle and image documentation was performed. A small dermatotomy was made at the access site with an 11 scalpel. A 0.018" wire was advanced into the SVC and the access needle exchanged for a 75F micropuncture vascular sheath. The 0.018" wire was then removed and a 0.035" wire advanced into the IVC. An appropriate location for the subcutaneous reservoir was selected below the clavicle and an incision was made through the skin and underlying soft tissues. The subcutaneous tissues were then dissected using a combination of blunt and sharp surgical technique and a pocket was formed. A single lumen power injectable portacatheter was then tunneled through the subcutaneous tissues from the pocket to the dermatotomy and the port reservoir placed within the subcutaneous pocket. The venous access site was then serially dilated and a peel away vascular sheath placed over the wire. The wire was removed and the port catheter advanced into position under fluoroscopic guidance. The catheter tip is positioned in the superior cavoatrial junction. This was documented with a spot image. The portacatheter was then tested and found to flush and aspirate well. The port was flushed with saline followed by 100 units/mL heparinized saline. The pocket was then closed in two layers using first subdermal inverted interrupted absorbable sutures followed by a running subcuticular suture. The epidermis was then sealed with Dermabond. The dermatotomy at the venous access site was also closed with Dermabond. IMPRESSION: Successful placement of a right IJ approach Power Port with ultrasound and fluoroscopic guidance. The catheter is ready for use. Electronically Signed   By: [name] M.D.   On: 01/15/2018 16:49    ASSESSMENT & PLAN:  62 y.o. 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  CT of the abdomen and pelvis performed on 12/22/2017 showed bulky left inguinal, left hemipelvic, and retroperitoneal adenopathy.   01/02/18 Left inguinal LN Biopsy revealed T-Cell/histocyte rich Large B-Cell Lymphoma  12/27/17 ECHO revealed LV EF of 55-60%   01/05/18 PET/CT revealedMassively enlarged pelvic lymph nodes intense metabolic activity consistent lymphoma. 2. Additional hypermetabolic lymph nodes in the porta hepatis and retroperitoneum LEFT aorta. 3. Solitary hypermetabolic mediastinal lymph node in the upper LEFT Mediastinum. 4. 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 5. Normal spleen.  HIV non reactive on 12/22/2017.Hep C and hep B serology negative.  2) left lower extremity swelling- now nearly resolved Doppler ultrasound for DVT was negative in the left lower extremity.  Likely from venous compression +/- lymphatic obstruction from bulky left inguinal, left hemipelvic, and retroperitoneal adenopathy.   3) Intermediate to high risk for tumor lysis syndrome.- no TLS noted with allopurinol prophylaxis after C1  4) S/p Port a cath placement   PLAN: -Okay  to hold Allopurinol at this time without concern for tumor lysis syndrome  -Discussed pt labwork today, 02/05/18; blood counts and chemistries are stable  -The pt has no prohibitive toxicities from continuing C2 EPOCH-R at this time.  -chemotherapy orders placed and signed and discussed with pharmacist -Rituxan and neulasta as outpatient on 02/12/2018 -will plan to rpt PET/CT after 3 cycles.  5) DVT prophylaxis -lovenox, SCD,  ambulation.  6) HTN -Continue outpatient antihypertensives and monitor  7) Mild hemorrhoidal bleeding. hgb stable. Prn anusol    All of the patients questions were answered with apparent satisfaction.   The total time spent in the appt was 70 minutes and more than 50% was on counseling and direct patient cares and co-ordination of cares with nursing and pharmacy    Sullivan Lone MD [place] AAHIVMS [facility] Hematology/Oncology Physician [facility]  (Office):       [phone] (Work cell):  [phone] (Fax):           [phone]  02/05/2018 10:22 AM  I, Baldwin [place], am acting as a scribe for Dr. Irene Limbo  .I have reviewed the above documentation for accuracy and completeness, and I agree with the above. Sullivan Lone MD MS

## 2018-02-05 NOTE — H&P (Signed)
HEMATOLOGY/ONCOLOGY CONSULTATION NOTE  Date of Service: 02/05/2018  Patient Care Team: Shirline Frees, MD as PCP - General (Family Medicine)  CHIEF COMPLAINTS/PURPOSE OF CONSULTATION:  Jesus [name] Jesus [name] [name] for T-Cell/Histocyte rich Large B-Cell Lymphoma   HISTORY OF PRESENTING ILLNESS:   Jesus [name] Jesus [name] [name] is a wonderful 62 y.o. male who has been admitted today for Jesus [name] Jesus [name] [name] treatment of his T-Cel/histocyte rich Large B-Cell Lymphoma. I last saw Jesus [name] Jesus [name] [name] in clinic on 02/02/18. Jesus [name] Jesus [name] [name] reports that he is doing well overall.   Jesus [name] Jesus [name] [name] reports that he has developed no new concerns in Jesus [name] interim and notes that he is ready to begin Jesus [name] Jesus [name] [name]. He denies any concerns for infections, and endorses stable energy levels. He notes that his leg swelling has continued to improve. He denies any constitutional symptoms, skin rashes, or abdominal pains. He adds that his feelings of abdominal fullness have subsided as well.   Lab results today (02/05/18) of CBC w/diff, CMP is as follows: all values are WNL except for RBC at 3.87, HGB at 11.6, HCT at 35.2, Sodium at 146, Glucose at 123.  On review of systems, Jesus [name] [name] reports improving leg swelling, good energy levels, moving his bowels, and denies abdominal pains, abdominal fullness, skin rashes, fevers, chills, night sweats, concerns for infections and any other symptoms.   MEDICAL HISTORY:  Past Medical History:  Diagnosis Date  . Allergy   . History of kidney stones   . Hyperlipidemia   . Hypertension   . Lymphadenopathy   . Pain, lower leg    Bilateral  . Peripheral arterial disease ([place])   . Pre-diabetes   . Red-green color blindness   . Snores   . Wears glasses     SURGICAL HISTORY: Past Surgical History:  Procedure Laterality Date  . CATARACT EXTRACTION W/ INTRAOCULAR LENS  IMPLANT, BILATERAL    . COLONOSCOPY    . dislodged salava stone    . FRACTURE SURGERY    . HAND ARTHROPLASTY  1995   crushed left hand  . INGUINAL LYMPH NODE BIOPSY Left  01/02/2018   Procedure: LEFT INGUINAL LYMPH NODE BIOPSY;  Surgeon: Rolm Bookbinder, MD;  Location: [facility];  Service: General;  Laterality: Left;  . IR IMAGING GUIDED PORT INSERTION  01/15/2018  . MICROLARYNGOSCOPY Left 01/17/2014   Procedure: MICROLARYNGOSCOPY WITH EXCISION OF Jesus [name] BIOPSY OF LEFT VOCAL CORD LESION;  Surgeon: Izora Gala, MD;  Location: [facility];  Service: ENT;  Laterality: Left;  . ORIF FOOT FRACTURE  2005   left    SOCIAL HISTORY: Social History   Socioeconomic History  . Marital status: Divorced    Spouse name: Not on file  . Number of children: 3  . Years of education: Not on file  . Highest education level: Not on file  Occupational History  . Not on file  Social Needs  . Financial resource strain: Not on file  . Food insecurity:    Worry: Not on file    Inability: Not on file  . Transportation needs:    Medical: Not on file    Non-medical: Not on file  Tobacco Use  . Smoking status: Current Every Day Smoker    Packs/day: 0.50    Years: 36.00    Pack years: 18.00    Types: Cigarettes  . Smokeless tobacco: Never Used  Substance and Sexual Activity  . Alcohol use: Yes    Alcohol/week: 15.0 standard drinks    Types: 10 Cans of beer, 5 Shots  of liquor per week    Comment: weekends  . Drug use: Yes    Types: Cocaine    Comment: reports cocaine usage ~2X/ month; last use 12/26/17  . Sexual activity: Not on file  Lifestyle  . Physical activity:    Days per week: Not on file    Minutes per session: Not on file  . Stress: Not on file  Relationships  . Social connections:    Talks on phone: Not on file    Gets together: Not on file    Attends religious service: Not on file    Active member of club or organization: Not on file    Attends meetings of clubs or organizations: Not on file    Relationship status: Not on file  . Intimate partner violence:    Fear of current or ex partner: Not on file    Emotionally abused: Not on file     Physically abused: Not on file    Forced sexual activity: Not on file  Other Topics Concern  . Not on file  Social History Narrative  . Not on file    FAMILY HISTORY: Family History  Problem Relation Age of Onset  . Breast cancer Mother   . Diabetes Father   . Hypertension Father   . Stroke Father   . Mental illness Sister   . Hypertension Daughter   . Mental illness Daughter   . Hypertension Brother   . Colon cancer Brother   . Breast cancer Sister     ALLERGIES:  is allergic to bee venom.  MEDICATIONS:  Current Facility-Administered Medications  Medication Dose Route Frequency Provider Last Rate Last Dose  . 0.9 %  sodium chloride infusion   Intravenous Continuous Brunetta Genera, MD 10 mL/hr at 02/05/18 1830    . acetaminophen (TYLENOL) tablet 650 mg  650 mg Oral Q4H PRN Brunetta Genera, MD      . alteplase (CATHFLO ACTIVASE) injection 2 mg  2 mg Intracatheter Once PRN Brunetta Genera, MD      . Derrill Memo ON 02/06/2018] amLODipine (NORVASC) tablet 10 mg  10 mg Oral Daily Brunetta Genera, MD      . Derrill Memo ON 02/06/2018] aspirin EC tablet 81 mg  81 mg Oral Daily Brunetta Genera, MD      . Cold Pack 1 packet  1 packet Topical Once PRN Brunetta Genera, MD      . DOXOrubicin (ADRIAMYCIN) 26 mg, etoposide (VEPESID) 132 mg, vinCRIStine (ONCOVIN) 1.1 mg in sodium chloride 0.9 % 1,000 mL chemo infusion   Intravenous Once Brunetta Genera, MD 51 mL/hr at 02/05/18 1624    . enoxaparin (LOVENOX) injection 40 mg  40 mg Subcutaneous Q24H Brunetta Genera, MD   40 mg at 02/05/18 2116  . heparin lock flush 100 unit/mL  500 Units Intracatheter Once PRN Brunetta Genera, MD      . heparin lock flush 100 unit/mL  250 Units Intracatheter Once PRN Brunetta Genera, MD      . Hot Pack 1 packet  1 packet Topical Once PRN Brunetta Genera, MD      . hydrocortisone (ANUSOL-HC) 2.5 % rectal cream 1 application  1 application Rectal BID PRN Brunetta Genera, MD      . Derrill Memo ON 02/06/2018] lisinopril (PRINIVIL,ZESTRIL) tablet 20 mg  20 mg Oral Daily Brunetta Genera, MD      . LORazepam (ATIVAN) tablet 0.5 mg  0.5 mg  Oral Q6H PRN Brunetta Genera, MD      . mupirocin ointment (BACTROBAN) 2 % 1 application  1 application Nasal BID Brunetta Genera, MD   1 application at 46/56/81 2116  . predniSONE (DELTASONE) tablet 60 mg  60 mg Oral QAC breakfast Brunetta Genera, MD   60 mg at 02/05/18 1554  . senna-docusate (Senokot-S) tablet 1 tablet  1 tablet Oral QHS PRN Brunetta Genera, MD      . sodium chloride flush (NS) 0.9 % injection 10 mL  10 mL Intracatheter PRN Brunetta Genera, MD      . sodium chloride flush (NS) 0.9 % injection 3 mL  3 mL Intracatheter PRN Brunetta Genera, MD        REVIEW OF SYSTEMS:    10 Point review of Systems was done is negative except as noted above.  PHYSICAL EXAMINATION: ECOG PERFORMANCE STATUS: 1 - Symptomatic but completely ambulatory  . Vitals:   02/05/18 1406 02/05/18 2131  BP: 123/72 (!) 141/78  Pulse: 90 94  Resp: 18 18  Temp: 98.1 F (36.7 C) 98.3 F (36.8 C)  SpO2: 99% 98%   Filed Weights   02/05/18 1024  Weight: 281 lb 1.4 oz (127.5 kg)   .Body mass index is 38.12 kg/m.  GENERAL:alert, in no acute distress and comfortable SKIN: no acute rashes, no significant lesions EYES: conjunctiva are pink and non-injected, sclera anicteric OROPHARYNX: MMM, no exudates, no oropharyngeal erythema or ulceration NECK: supple, no JVD LYMPH:  no palpable lymphadenopathy in Jesus [name] cervical, axillary or inguinal regions LUNGS: clear to auscultation b/l with normal respiratory effort HEART: regular rate & rhythm ABDOMEN:  normoactive bowel sounds , non tender, not distended. Extremity: no pedal edema PSYCH: alert & oriented x 3 with fluent speech NEURO: no focal motor/sensory deficits  LABORATORY DATA:  I have reviewed Jesus [name] data as listed  . CBC Latest Ref Rng & Units  02/05/2018 02/02/2018 01/26/2018  WBC 4.0 - 10.5 K/uL 4.2 6.1 3.4(L)  Hemoglobin 13.0 - 17.0 g/dL 11.6(L) 12.4(L) 11.0(L)  Hematocrit 39.0 - 52.0 % 35.2(L) 37.4(L) 33.9(L)  Platelets 150 - 400 K/uL 360 213 125(L)    . CMP Latest Ref Rng & Units 02/05/2018 02/02/2018 01/26/2018  Glucose 70 - 99 mg/dL 123(H) 135(H) 156(H)  BUN 8 - 23 mg/dL 16 10 15   Creatinine 0.61 - 1.24 mg/dL 1.05 1.03 0.90  Sodium 135 - 145 mmol/L 146(H) 141 143  Potassium 3.5 - 5.1 mmol/L 4.2 4.2 3.5  Chloride 98 - 111 mmol/L 111 105 103  CO2 22 - 32 mmol/L 26 26 31   Calcium 8.9 - 10.3 mg/dL 9.3 9.8 8.7(L)  Total Protein 6.5 - 8.1 g/dL 6.7 6.8 5.6(L)  Total Bilirubin 0.3 - 1.2 mg/dL 0.7 0.5 0.4  Alkaline Phos 38 - 126 U/L 60 85 62  AST 15 - 41 U/L 18 13(L) 9(L)  ALT 0 - 44 U/L 18 19 20      RADIOGRAPHIC STUDIES: I have personally reviewed Jesus [name] radiological images as listed and agreed with Jesus [name] findings in Jesus [name] report. Ir Imaging Guided Port Insertion  Result Date: 01/15/2018 INDICATION: 62 year old male with diffuse large B-cell lymphoma in need of durable venous access to begin chemotherapy. EXAM: IMPLANTED PORT A CATH PLACEMENT WITH ULTRASOUND AND FLUOROSCOPIC GUIDANCE MEDICATIONS: 2 g Ancef; Jesus [name] antibiotic was administered within an appropriate time interval prior to skin puncture. ANESTHESIA/SEDATION: Versed 2 mg IV; Fentanyl 100 mcg IV; Moderate Sedation Time:  19 minutes Jesus [name] patient was  continuously monitored during Jesus [name] procedure by Jesus [name] interventional radiology nurse under my direct supervision. FLUOROSCOPY TIME:  0 minutes, 24 seconds (20 mGy) COMPLICATIONS: None immediate. PROCEDURE: Jesus [name] right neck and chest was prepped with chlorhexidine, and draped in Jesus [name] usual sterile fashion using maximum barrier technique (cap and mask, sterile gown, sterile gloves, large sterile sheet, hand hygiene and cutaneous antiseptic). Antibiotic prophylaxis was provided with 2g Ancef administered IV one hour prior to skin incision. Local  anesthesia was attained by infiltration with 1% lidocaine with epinephrine. Ultrasound demonstrated patency of Jesus [name] right internal jugular vein, and this was documented with an image. Under real-time ultrasound guidance, this vein was accessed with a 21 gauge micropuncture needle and image documentation was performed. A small dermatotomy was made at Jesus [name] access site with an 11 scalpel. A 0.018" wire was advanced into Jesus [name] SVC and Jesus [name] access needle exchanged for a 12F micropuncture vascular sheath. Jesus [name] 0.018" wire was then removed and a 0.035" wire advanced into Jesus [name] IVC. An appropriate location for Jesus [name] subcutaneous reservoir was selected below Jesus [name] clavicle and an incision was made through Jesus [name] skin and underlying soft tissues. Jesus [name] subcutaneous tissues were then dissected using a combination of blunt and sharp surgical technique and a pocket was formed. A single lumen power injectable portacatheter was then tunneled through Jesus [name] subcutaneous tissues from Jesus [name] pocket to Jesus [name] dermatotomy and Jesus [name] port reservoir placed within Jesus [name] subcutaneous pocket. Jesus [name] venous access site was then serially dilated and a peel away vascular sheath placed over Jesus [name] wire. Jesus [name] wire was removed and Jesus [name] port catheter advanced into position under fluoroscopic guidance. Jesus [name] catheter tip is positioned in Jesus [name] superior cavoatrial junction. This was documented with a spot image. Jesus [name] portacatheter was then tested and found to flush and aspirate well. Jesus [name] port was flushed with saline followed by 100 units/mL heparinized saline. Jesus [name] pocket was then closed in two layers using first subdermal inverted interrupted absorbable sutures followed by a running subcuticular suture. Jesus [name] epidermis was then sealed with Dermabond. Jesus [name] dermatotomy at Jesus [name] venous access site was also closed with Dermabond. IMPRESSION: Successful placement of a right IJ approach Power Port with ultrasound and fluoroscopic guidance. Jesus [name] catheter is ready for use. Electronically Signed   By: [name] M.D.   On: 01/15/2018 16:49    ASSESSMENT & PLAN:  62 y.o. 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  CT of Jesus [name] abdomen and pelvis performed on 12/22/2017 showed bulky left inguinal, left hemipelvic, and retroperitoneal adenopathy.   01/02/18 Left inguinal LN Biopsy revealed T-Cell/histocyte rich Large B-Cell Lymphoma  12/27/17 ECHO revealed LV EF of 55-60%   01/05/18 PET/CT revealedMassively enlarged pelvic lymph nodes intense metabolic activity consistent lymphoma. 2. Additional hypermetabolic lymph nodes in Jesus [name] porta hepatis and retroperitoneum LEFT aorta. 3. Solitary hypermetabolic mediastinal lymph node in Jesus [name] upper LEFT Mediastinum. 4. 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 5. Normal spleen.  HIV non reactive on 12/22/2017.Hep C and hep B serology negative.  2) left lower extremity swelling- now nearly resolved Doppler ultrasound for DVT was negative in Jesus [name] left lower extremity.  Likely from venous compression +/- lymphatic obstruction from bulky left inguinal, left hemipelvic, and retroperitoneal adenopathy.   3) Intermediate to high risk for tumor lysis syndrome.- no TLS noted with allopurinol prophylaxis after C1  4) S/p Port a cath placement   PLAN: -Okay  to hold Allopurinol at this time without concern for tumor lysis syndrome  -Discussed Jesus [name] [name] labwork today, 02/05/18; blood counts and chemistries are stable  -Jesus [name] Jesus [name] [name] has no prohibitive toxicities from continuing Jesus [name] Jesus [name] [name] at this time.  -chemotherapy orders placed and signed and discussed with pharmacist -Rituxan and neulasta as outpatient on 02/12/2018 -will plan to rpt PET/CT after 3 cycles.  5) DVT prophylaxis -lovenox, SCD,  ambulation.  6) HTN -Continue outpatient antihypertensives and monitor  7) Mild hemorrhoidal bleeding. hgb stable. Prn anusol    All of Jesus [name] patients questions were answered with apparent satisfaction.   Jesus [name] total time spent in Jesus [name] appt was 70 minutes and more than 50% was on counseling and direct patient cares and co-ordination of cares with nursing and pharmacy    Jesus [name] Lone MD [place] AAHIVMS [facility] Hematology/Oncology Physician [facility]  (Office):       [phone] (Work cell):  [phone] (Fax):           [phone]  02/05/2018 11:19 PM  I, Baldwin [place], am acting as a scribe for Dr. Irene Limbo  .I have reviewed Jesus [name] above documentation for accuracy and completeness, and I agree with Jesus [name] above. Jesus [name] Lone MD MS

## 2018-02-06 LAB — CBC
HCT: 34.5 % — ABNORMAL LOW (ref 39.0–52.0)
Hemoglobin: 11.3 g/dL — ABNORMAL LOW (ref 13.0–17.0)
MCH: 29.7 pg (ref 26.0–34.0)
MCHC: 32.8 g/dL (ref 30.0–36.0)
MCV: 90.6 fL (ref 78.0–100.0)
Platelets: 398 10*3/uL (ref 150–400)
RBC: 3.81 MIL/uL — ABNORMAL LOW (ref 4.22–5.81)
RDW: 14.1 % (ref 11.5–15.5)
WBC: 7.1 10*3/uL (ref 4.0–10.5)

## 2018-02-06 LAB — BASIC METABOLIC PANEL
Anion gap: 8 (ref 5–15)
BUN: 15 mg/dL (ref 8–23)
Calcium: 9.4 mg/dL (ref 8.9–10.3)
GFR calc non Af Amer: >60 mL/min (ref >60)
Glucose, Bld: 210 mg/dL — ABNORMAL HIGH (ref 70–99)
Sodium: 140 mmol/L (ref 135–145)

## 2018-02-06 LAB — BASIC METABOLIC PANEL WITH GFR
CO2: 26 mmol/L (ref 22–32)
Chloride: 106 mmol/L (ref 98–111)
Creatinine, Ser: 1.03 mg/dL (ref 0.61–1.24)
GFR calc Af Amer: >60 mL/min (ref >60)
Potassium: 4.4 mmol/L (ref 3.5–5.1)

## 2018-02-06 MED ORDER — SODIUM CHLORIDE 0.9% FLUSH: 10.0000 mL | INTRAVENOUS | Status: DC | PRN

## 2018-02-06 MED ORDER — SODIUM CHLORIDE 0.9 % IV SOLN
Freq: Once | INTRAVENOUS | Status: AC
  Administered 2018-02-06: 8 mg via INTRAVENOUS
  Filled 2018-02-06: qty 4

## 2018-02-06 MED ORDER — VINCRISTINE SULFATE CHEMO INJECTION 1 MG/ML
Freq: Once | INTRAVENOUS | Status: AC
  Administered 2018-02-06: 16:00:00 via INTRAVENOUS
  Filled 2018-02-06: qty 13

## 2018-02-06 MED ORDER — ENSURE ENLIVE PO LIQD
237.0000 mL | Freq: Two times a day (BID) | ORAL | Status: DC
  Administered 2018-02-08 – 2018-02-09 (×2): 237 mL via ORAL

## 2018-02-06 NOTE — Progress Notes (Signed)
HEMATOLOGY/ONCOLOGY INPATIENT PROGRESS NOTE  Date of Service: 02/06/2018  Inpatient Attending: .Brunetta Genera, MD   SUBJECTIVE:   Jesus [name] reports that he is doing well overall and notes that he has continued to tolerate C2D2 EPOCH-R without complaint or difficulty.   The pt reports that he has not had any nausea, vomiting, or diarrhea. He has had normal and regular bowel movements. He adds that his leg swelling has been minimal. He continues eating well and endorses good energy levels.    Lab results today (02/06/18) of CBC and BMP is as follows: all values are WNL except for RBC at 3.81, HGB at 11.3, HCT at 34.5, Glucose at 210.  On review of systems, pt reports eating well, moving his bowels well, good energy levels, and denies nausea, vomiting, diarrhea, blood in the stools, black stools, and any other symptoms.    OBJECTIVE:  NAD  PHYSICAL EXAMINATION: . Vitals:   02/05/18 1406 02/05/18 2131 02/06/18 0508 02/06/18 1315  BP: 123/72 (!) 141/78 (!) 178/87 (!) 146/89  Pulse: 90 94 83 92  Resp: 18 18 16 20   Temp: 98.1 F (36.7 C) 98.3 F (36.8 C)  98.1 F (36.7 C)  TempSrc: Oral Oral  Oral  SpO2: 99% 98% 97% 99%  Weight:      Height:       Filed Weights   02/05/18 1024  Weight: 281 lb 1.4 oz (127.5 kg)   .Body mass index is 38.12 kg/m.  GENERAL:alert, in no acute distress and comfortable SKIN: skin color, texture, turgor are normal, no rashes or significant lesions EYES: normal, conjunctiva are pink and non-injected, sclera clear OROPHARYNX:no exudate, no erythema and lips, buccal mucosa, and tongue normal  NECK: supple, no JVD, thyroid normal size, non-tender, without nodularity LYMPH:  no palpable lymphadenopathy in the cervical, axillary or inguinal LUNGS: clear to auscultation with normal respiratory effort HEART: regular rate & rhythm,  no murmurs and trace lower extremity edema ABDOMEN: abdomen soft, non-tender, normoactive bowel sounds    Musculoskeletal: no cyanosis of digits and no clubbing  PSYCH: alert & oriented x 3 with fluent speech NEURO: no focal motor/sensory deficits  MEDICAL HISTORY:  Past Medical History:  Diagnosis Date  . Allergy   . History of kidney stones   . Hyperlipidemia   . Hypertension   . Lymphadenopathy   . Pain, lower leg    Bilateral  . Peripheral arterial disease ([place])   . Pre-diabetes   . Red-green color blindness   . Snores   . Wears glasses     SURGICAL HISTORY: Past Surgical History:  Procedure Laterality Date  . CATARACT EXTRACTION W/ INTRAOCULAR LENS  IMPLANT, BILATERAL    . COLONOSCOPY    . dislodged salava stone    . FRACTURE SURGERY    . HAND ARTHROPLASTY  1995   crushed left hand  . INGUINAL LYMPH NODE BIOPSY Left 01/02/2018   Procedure: LEFT INGUINAL LYMPH NODE BIOPSY;  Surgeon: Rolm Bookbinder, MD;  Location: [place];  Service: General;  Laterality: Left;  . IR IMAGING GUIDED PORT INSERTION  01/15/2018  . MICROLARYNGOSCOPY Left 01/17/2014   Procedure: MICROLARYNGOSCOPY WITH EXCISION OF THE BIOPSY OF LEFT VOCAL CORD LESION;  Surgeon: Izora Gala, MD;  Location: [place];  Service: ENT;  Laterality: Left;  . ORIF FOOT FRACTURE  2005   left    SOCIAL HISTORY: Social History   Socioeconomic History  . Marital status: Divorced    Spouse name: Not  on file  . Number of children: 3  . Years of education: Not on file  . Highest education level: Not on file  Occupational History  . Not on file  Social Needs  . Financial resource strain: Not on file  . Food insecurity:    Worry: Not on file    Inability: Not on file  . Transportation needs:    Medical: Not on file    Non-medical: Not on file  Tobacco Use  . Smoking status: Current Every Day Smoker    Packs/day: 0.50    Years: 36.00    Pack years: 18.00    Types: Cigarettes  . Smokeless tobacco: Never Used  Substance and Sexual Activity  . Alcohol use: Yes    Alcohol/week: 15.0 standard  drinks    Types: 10 Cans of beer, 5 Shots of liquor per week    Comment: weekends  . Drug use: Yes    Types: Cocaine    Comment: reports cocaine usage ~2X/ month; last use 12/26/17  . Sexual activity: Not on file  Lifestyle  . Physical activity:    Days per week: Not on file    Minutes per session: Not on file  . Stress: Not on file  Relationships  . Social connections:    Talks on phone: Not on file    Gets together: Not on file    Attends religious service: Not on file    Active member of club or organization: Not on file    Attends meetings of clubs or organizations: Not on file    Relationship status: Not on file  . Intimate partner violence:    Fear of current or ex partner: Not on file    Emotionally abused: Not on file    Physically abused: Not on file    Forced sexual activity: Not on file  Other Topics Concern  . Not on file  Social History Narrative  . Not on file    FAMILY HISTORY: Family History  Problem Relation Age of Onset  . Breast cancer Mother   . Diabetes Father   . Hypertension Father   . Stroke Father   . Mental illness Sister   . Hypertension Daughter   . Mental illness Daughter   . Hypertension Brother   . Colon cancer Brother   . Breast cancer Sister     ALLERGIES:  is allergic to bee venom.  MEDICATIONS:  Scheduled Meds: . amLODipine  10 mg Oral Daily  . aspirin EC  81 mg Oral Daily  . DOXOrubicin/vinCRIStine/etoposide CHEMO IV infusion for Inpatient CI   Intravenous Once  . enoxaparin (LOVENOX) injection  40 mg Subcutaneous Q24H  . feeding supplement (ENSURE ENLIVE)  237 mL Oral BID BM  . lisinopril  20 mg Oral Daily  . mupirocin ointment  1 application Nasal BID  . predniSONE  60 mg Oral QAC breakfast   Continuous Infusions: . sodium chloride 10 mL/hr at 02/06/18 0407   PRN Meds:.acetaminophen, alteplase, Cold Pack, heparin lock flush, heparin lock flush, Hot Pack, hydrocortisone, LORazepam, senna-docusate, sodium chloride flush,  sodium chloride flush, sodium chloride flush  REVIEW OF SYSTEMS:    10 Point review of Systems was done is negative except as noted above.   LABORATORY DATA:  I have reviewed the data as listed  . CBC Latest Ref Rng & Units 02/06/2018 02/05/2018 02/02/2018  WBC 4.0 - 10.5 K/uL 7.1 4.2 6.1  Hemoglobin 13.0 - 17.0 g/dL 11.3(L) 11.6(L) 12.4(L)  Hematocrit  39.0 - 52.0 % 34.5(L) 35.2(L) 37.4(L)  Platelets 150 - 400 K/uL 398 360 213    . CMP Latest Ref Rng & Units 02/06/2018 02/05/2018 02/02/2018  Glucose 70 - 99 mg/dL 210(H) 123(H) 135(H)  BUN 8 - 23 mg/dL 15 16 10   Creatinine 0.61 - 1.24 mg/dL 1.03 1.05 1.03  Sodium 135 - 145 mmol/L 140 146(H) 141  Potassium 3.5 - 5.1 mmol/L 4.4 4.2 4.2  Chloride 98 - 111 mmol/L 106 111 105  CO2 22 - 32 mmol/L 26 26 26   Calcium 8.9 - 10.3 mg/dL 9.4 9.3 9.8  Total Protein 6.5 - 8.1 g/dL - 6.7 6.8  Total Bilirubin 0.3 - 1.2 mg/dL - 0.7 0.5  Alkaline Phos 38 - 126 U/L - 60 85  AST 15 - 41 U/L - 18 13(L)  ALT 0 - 44 U/L - 18 19     RADIOGRAPHIC STUDIES: I have personally reviewed the radiological images as listed and agreed with the findings in the report. Ir Imaging Guided Port Insertion  Result Date: 01/15/2018 INDICATION: 62 year old male with diffuse large B-cell lymphoma in need of durable venous access to begin chemotherapy. EXAM: IMPLANTED PORT A CATH PLACEMENT WITH ULTRASOUND AND FLUOROSCOPIC GUIDANCE MEDICATIONS: 2 g Ancef; The antibiotic was administered within an appropriate time interval prior to skin puncture. ANESTHESIA/SEDATION: Versed 2 mg IV; Fentanyl 100 mcg IV; Moderate Sedation Time:  19 minutes The patient was continuously monitored during the procedure by the interventional radiology nurse under my direct supervision. FLUOROSCOPY TIME:  0 minutes, 24 seconds (20 mGy) COMPLICATIONS: None immediate. PROCEDURE: The right neck and chest was prepped with chlorhexidine, and draped in the usual sterile fashion using maximum barrier technique  (cap and mask, sterile gown, sterile gloves, large sterile sheet, hand hygiene and cutaneous antiseptic). Antibiotic prophylaxis was provided with 2g Ancef administered IV one hour prior to skin incision. Local anesthesia was attained by infiltration with 1% lidocaine with epinephrine. Ultrasound demonstrated patency of the right internal jugular vein, and this was documented with an image. Under real-time ultrasound guidance, this vein was accessed with a 21 gauge micropuncture needle and image documentation was performed. A small dermatotomy was made at the access site with an 11 scalpel. A 0.018" wire was advanced into the SVC and the access needle exchanged for a 34F micropuncture vascular sheath. The 0.018" wire was then removed and a 0.035" wire advanced into the IVC. An appropriate location for the subcutaneous reservoir was selected below the clavicle and an incision was made through the skin and underlying soft tissues. The subcutaneous tissues were then dissected using a combination of blunt and sharp surgical technique and a pocket was formed. A single lumen power injectable portacatheter was then tunneled through the subcutaneous tissues from the pocket to the dermatotomy and the port reservoir placed within the subcutaneous pocket. The venous access site was then serially dilated and a peel away vascular sheath placed over the wire. The wire was removed and the port catheter advanced into position under fluoroscopic guidance. The catheter tip is positioned in the superior cavoatrial junction. This was documented with a spot image. The portacatheter was then tested and found to flush and aspirate well. The port was flushed with saline followed by 100 units/mL heparinized saline. The pocket was then closed in two layers using first subdermal inverted interrupted absorbable sutures followed by a running subcuticular suture. The epidermis was then sealed with Dermabond. The dermatotomy at the venous access  site was also closed with Dermabond.  IMPRESSION: Successful placement of a right IJ approach Power Port with ultrasound and fluoroscopic guidance. The catheter is ready for use. Electronically Signed   By: [name] M.D.   On: 01/15/2018 16:49    ASSESSMENT & PLAN:  62 y.o. 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  CT of the abdomen and pelvis performed on 12/22/2017 showed bulky left inguinal, left hemipelvic, and retroperitoneal adenopathy.   01/02/18 Left inguinal LN Biopsy revealed T-Cell/histocyte rich Large B-Cell Lymphoma  12/27/17 ECHO revealed LV EF of 55-60%   01/05/18 PET/CT revealedMassively enlarged pelvic lymph nodes intense metabolic activity consistent lymphoma. 2. Additional hypermetabolic lymph nodes in the porta hepatis and retroperitoneum LEFT aorta. 3. Solitary hypermetabolic mediastinal lymph node in the upper LEFT Mediastinum. 4. 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 5. Normal spleen.  HIV non reactive on 12/22/2017.Hep C and hep B serology negative.  2) left lower extremity swelling- now nearly resolved Doppler ultrasound for DVT was negative in the left lower extremity.  Likely from venous compression +/- lymphatic obstruction from bulky left inguinal, left hemipelvic, and retroperitoneal adenopathy.   3) Intermediate to high risk for tumor lysis syndrome.- no TLS noted with allopurinol prophylaxis after C1  4) S/p Port a cath placement   PLAN:  -Discussed pt labwork today, 02/06/18; blood counts and chemistries are stable  -The pt has no prohibitive toxicities from continuing C2D2 of EPOCH-R at this time.   -Continue with SCD's  -Continue eating well, staying hydrated and staying  as active as reasonably possible   -Rituxan and neulasta as outpatient on 02/12/2018 -will plan to rpt PET/CT after 3 cycles.  5) DVT prophylaxis -lovenox, SCD, ambulation.  6) HTN -Continue outpatient antihypertensives and monitor  7) Mild hemorrhoidal bleeding. hgb stable. Prn anusol   The total time spent in the appt was 25 minutes and more than 50% was on counseling and direct patient cares.      Sullivan Lone MD MS AAHIVMS [facility] Hematology/Oncology Physician [facility]  (Office):       [phone] (Work cell):  [phone] (Fax):           [phone]  02/06/2018 4:10 PM   I, Baldwin [place], am acting as a scribe for Dr. Irene Limbo  .I have reviewed the above documentation for accuracy and completeness, and I agree with the above. Sullivan Lone MD MS

## 2018-02-06 NOTE — Progress Notes (Signed)
Initial Nutrition Assessment  DOCUMENTATION CODES:   Not applicable  INTERVENTION:  Ensure Enlive  BID provides: 350kcal 20g protein per serving   NUTRITION DIAGNOSIS:   Predicted suboptimal nutrient intake related to cancer and cancer related treatments(stage IV lymphoma and admission for radiation tx) as evidenced by energy intake < 75% for > 7 days.    GOAL:   Patient will meet greater than or equal to 90% of their needs    MONITOR:   Supplement acceptance, PO intake, Labs  REASON FOR ASSESSMENT:   Malnutrition Screening Tool   ASSESSMENT:  Pt admitted 9/30 for C2 EPOCH-R treatment of stage IV T-cell Large B-cell Lymphoma. Last seen in clinic 9/27. PMH: HLD, HTN, PAD. Pt to begin outpt chemo (Rituxan and Neulasta) on 10/7   Pt sitting in chair looking over lunch menu at time of visit. Pt reports feeling great and very delightful to speak with. 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 Pt endorses a good appetite w/ minor taste changes when eating beef or pork. Pt stated he just recently began eating breakfast to ease GI upset w/ medications and has liver pudding, grits, and decaf coffee. Lunch pt reports eating out with his son and likes to get a salad from [employer]. Pt likes to cook dinner and uses a smoker or grill to prepare meats/fish/ and eats an assortment of vegetables.  Pt reports losing 20lbs in 3 weeks after beginning treatment. Stated he has never felt better now that he has lost a little wt. Pt open to trying ONS during admission and willing to continue w/ supplement if he experiences loss of appetite.   Medications reviewed: prednisone Labs reviewed: Glucose 123 (H), Na 146 (H)   NUTRITION - FOCUSED PHYSICAL EXAM:    Most Recent Value  Orbital Region  Mild depletion  Upper Arm Region  No depletion  Thoracic and Lumbar Region  No depletion  Buccal Region  No depletion  Temple Region  Mild depletion  Clavicle Bone Region  No depletion   Clavicle and Acromion Bone Region  No depletion  Scapular Bone Region  No depletion  Dorsal Hand  No depletion  Patellar Region  No depletion  Anterior Thigh Region  Unable to assess  Posterior Calf Region  Unable to assess  Edema (RD Assessment)  Mild [BLE,  non-pitting]  Hair  Reviewed  Eyes  Reviewed  Mouth  Reviewed  Skin  Reviewed  Nails  Reviewed       Diet Order:   Diet Order            Diet regular Room service appropriate? Yes; Fluid consistency: Thin  Diet effective now              EDUCATION NEEDS:   No education needs have been identified at this time  Skin:  Skin Assessment: Reviewed RN Assessment(surgical incision to left knee, scattered bruising)  Last BM:  9/30  Height:   Ht Readings from Last 1 Encounters:  02/05/18 6' (1.829 m)    Weight:   Wt Readings from Last 1 Encounters:  02/05/18 127.5 kg     Ideal Body Weight:  81 kg  BMI:  Body mass index is 38.12 kg/m.  Estimated Nutritional Needs:   Kcal:  [address](25-30g/kg IBW)  Protein:  93-122g  Fluid:  2.4L    SLajuan Lines RD, LDN  After Hours/Weekend Pager: [phone]

## 2018-02-07 LAB — BASIC METABOLIC PANEL
BUN: 15 mg/dL (ref 8–23)
Chloride: 107 mmol/L (ref 98–111)
Creatinine, Ser: 0.84 mg/dL (ref 0.61–1.24)
Glucose, Bld: 220 mg/dL — ABNORMAL HIGH (ref 70–99)
Sodium: 142 mmol/L (ref 135–145)

## 2018-02-07 LAB — CBC
HCT: 34.7 % — ABNORMAL LOW (ref 39.0–52.0)
Hemoglobin: 11.2 g/dL — ABNORMAL LOW (ref 13.0–17.0)
MCH: 29.2 pg (ref 26.0–34.0)
MCHC: 32.3 g/dL (ref 30.0–36.0)
MCV: 90.4 fL (ref 78.0–100.0)
Platelets: 435 10*3/uL — ABNORMAL HIGH (ref 150–400)
RBC: 3.84 MIL/uL — ABNORMAL LOW (ref 4.22–5.81)
RDW: 14 % (ref 11.5–15.5)
WBC: 11 10*3/uL — ABNORMAL HIGH (ref 4.0–10.5)

## 2018-02-07 LAB — BASIC METABOLIC PANEL WITH GFR
Anion gap: 9 (ref 5–15)
CO2: 26 mmol/L (ref 22–32)
Calcium: 9.9 mg/dL (ref 8.9–10.3)
GFR calc Af Amer: >60 mL/min (ref >60)
GFR calc non Af Amer: >60 mL/min (ref >60)
Potassium: 4 mmol/L (ref 3.5–5.1)

## 2018-02-07 MED ORDER — SODIUM CHLORIDE 0.9 % IV SOLN
Freq: Once | INTRAVENOUS | Status: AC
  Administered 2018-02-07: 8 mg via INTRAVENOUS
  Filled 2018-02-07: qty 4

## 2018-02-07 MED ORDER — VINCRISTINE SULFATE CHEMO INJECTION 1 MG/ML
Freq: Once | INTRAVENOUS | Status: AC
  Administered 2018-02-07: 14:00:00 via INTRAVENOUS
  Filled 2018-02-07: qty 13

## 2018-02-07 NOTE — Progress Notes (Signed)
HEMATOLOGY/ONCOLOGY INPATIENT PROGRESS NOTE  Date of Service: 02/07/2018  Inpatient Attending: .Brunetta Genera, MD   SUBJECTIVE:   Jesus [name] reports that he is doing well overall and has not developed any new concerns.  The pt reports that his energy levels have been stable and that he has been eating well and has been walking around. He denies any constitutional symptoms or headaches or new leg swelling.   Lab results today (02/07/18) of CBC and BMP is as follows: all values are WNL except for WBC at 11.0k, RBC at 3.84, HGB at 11.2, HCT at 34.7, PLT at 435k, Glucose at 220.  On review of systems, pt reports eating well, staying active, good energy levels, stable and trace ankle swelling, and denies mouth sores, abdominal pains, headaches, diarrhea, and any other symptoms.   OBJECTIVE:  NAD  PHYSICAL EXAMINATION: . Vitals:   02/06/18 1315 02/06/18 2218 02/07/18 0603 02/07/18 1332  BP: (!) 146/89 (!) 153/89 (!) 144/90 135/84  Pulse: 92 85 67 72  Resp: 20 18 16 15   Temp: 98.1 F (36.7 C) 98.3 F (36.8 C) 98 F (36.7 C) 98.1 F (36.7 C)  TempSrc: Oral Oral Oral Oral  SpO2: 99% 100% 98% 92%  Weight:      Height:       Filed Weights   02/05/18 1024  Weight: 281 lb 1.4 oz (127.5 kg)   .Body mass index is 38.12 kg/m.  GENERAL:alert, in no acute distress and comfortable SKIN: no acute rashes, no significant lesions EYES: conjunctiva are pink and non-injected, sclera anicteric OROPHARYNX: MMM, no exudates, no oropharyngeal erythema or ulceration NECK: supple, no JVD LYMPH:  no palpable lymphadenopathy in the cervical, axillary or inguinal regions LUNGS: clear to auscultation b/l with normal respiratory effort HEART: regular rate & rhythm ABDOMEN:  normoactive bowel sounds , non tender, not distended. No palpable hepatosplenomegaly.  Extremity: trace pedal edema PSYCH: alert & oriented x 3 with fluent speech NEURO: no focal motor/sensory deficits   MEDICAL  HISTORY:  Past Medical History:  Diagnosis Date  . Allergy   . History of kidney stones   . Hyperlipidemia   . Hypertension   . Lymphadenopathy   . Pain, lower leg    Bilateral  . Peripheral arterial disease ([place])   . Pre-diabetes   . Red-green color blindness   . Snores   . Wears glasses     SURGICAL HISTORY: Past Surgical History:  Procedure Laterality Date  . CATARACT EXTRACTION W/ INTRAOCULAR LENS  IMPLANT, BILATERAL    . COLONOSCOPY    . dislodged salava stone    . FRACTURE SURGERY    . HAND ARTHROPLASTY  1995   crushed left hand  . INGUINAL LYMPH NODE BIOPSY Left 01/02/2018   Procedure: LEFT INGUINAL LYMPH NODE BIOPSY;  Surgeon: Rolm Bookbinder, MD;  Location: [place];  Service: General;  Laterality: Left;  . IR IMAGING GUIDED PORT INSERTION  01/15/2018  . MICROLARYNGOSCOPY Left 01/17/2014   Procedure: MICROLARYNGOSCOPY WITH EXCISION OF THE BIOPSY OF LEFT VOCAL CORD LESION;  Surgeon: Izora Gala, MD;  Location: [place];  Service: ENT;  Laterality: Left;  . ORIF FOOT FRACTURE  2005   left    SOCIAL HISTORY: Social History   Socioeconomic History  . Marital status: Divorced    Spouse name: Not on file  . Number of children: 3  . Years of education: Not on file  . Highest education level: Not on file  Occupational History  .  Not on file  Social Needs  . Financial resource strain: Not on file  . Food insecurity:    Worry: Not on file    Inability: Not on file  . Transportation needs:    Medical: Not on file    Non-medical: Not on file  Tobacco Use  . Smoking status: Current Every Day Smoker    Packs/day: 0.50    Years: 36.00    Pack years: 18.00    Types: Cigarettes  . Smokeless tobacco: Never Used  Substance and Sexual Activity  . Alcohol use: Yes    Alcohol/week: 15.0 standard drinks    Types: 10 Cans of beer, 5 Shots of liquor per week    Comment: weekends  . Drug use: Yes    Types: Cocaine    Comment: reports cocaine usage ~2X/  month; last use 12/26/17  . Sexual activity: Not on file  Lifestyle  . Physical activity:    Days per week: Not on file    Minutes per session: Not on file  . Stress: Not on file  Relationships  . Social connections:    Talks on phone: Not on file    Gets together: Not on file    Attends religious service: Not on file    Active member of club or organization: Not on file    Attends meetings of clubs or organizations: Not on file    Relationship status: Not on file  . Intimate partner violence:    Fear of current or ex partner: Not on file    Emotionally abused: Not on file    Physically abused: Not on file    Forced sexual activity: Not on file  Other Topics Concern  . Not on file  Social History Narrative  . Not on file    FAMILY HISTORY: Family History  Problem Relation Age of Onset  . Breast cancer Mother   . Diabetes Father   . Hypertension Father   . Stroke Father   . Mental illness Sister   . Hypertension Daughter   . Mental illness Daughter   . Hypertension Brother   . Colon cancer Brother   . Breast cancer Sister     ALLERGIES:  is allergic to bee venom.  MEDICATIONS:  Scheduled Meds: . amLODipine  10 mg Oral Daily  . aspirin EC  81 mg Oral Daily  . DOXOrubicin/vinCRIStine/etoposide CHEMO IV infusion for Inpatient CI   Intravenous Once  . enoxaparin (LOVENOX) injection  40 mg Subcutaneous Q24H  . feeding supplement (ENSURE ENLIVE)  237 mL Oral BID BM  . lisinopril  20 mg Oral Daily  . mupirocin ointment  1 application Nasal BID  . predniSONE  60 mg Oral QAC breakfast   Continuous Infusions: . sodium chloride 20 mL/hr at 02/06/18 1542   PRN Meds:.acetaminophen, alteplase, Cold Pack, heparin lock flush, heparin lock flush, Hot Pack, hydrocortisone, LORazepam, senna-docusate, sodium chloride flush, sodium chloride flush, sodium chloride flush  REVIEW OF SYSTEMS:    A 10+ POINT REVIEW OF SYSTEMS WAS OBTAINED including neurology, dermatology, psychiatry,  cardiac, respiratory, lymph, extremities, GI, GU, Musculoskeletal, constitutional, breasts, reproductive, HEENT.  All pertinent positives are noted in the HPI.  All others are negative.   LABORATORY DATA:  I have reviewed the data as listed  . CBC Latest Ref Rng & Units 02/08/2018 02/07/2018 02/06/2018  WBC 4.0 - 10.5 K/uL 5.9 11.0(H) 7.1  Hemoglobin 13.0 - 17.0 g/dL 11.3(L) 11.2(L) 11.3(L)  Hematocrit 39.0 - 52.0 % 34.5(L)  34.7(L) 34.5(L)  Platelets 150 - 400 K/uL 447(H) 435(H) 398   . CBC    Component Value Date/Time   WBC 5.9 02/08/2018 0606   RBC 3.84 (L) 02/08/2018 0606   HGB 11.3 (L) 02/08/2018 0606   HCT 34.5 (L) 02/08/2018 0606   PLT 447 (H) 02/08/2018 0606   MCV 89.8 02/08/2018 0606   MCV 87.3 12/22/2017 1518   MCH 29.4 02/08/2018 0606   MCHC 32.8 02/08/2018 0606   RDW 14.0 02/08/2018 0606   LYMPHSABS 1.0 02/05/2018 1117   MONOABS 0.5 02/05/2018 1117   EOSABS 0.0 02/05/2018 1117   BASOSABS 0.0 02/05/2018 1117     . CMP Latest Ref Rng & Units 02/07/2018 02/06/2018 02/05/2018  Glucose 70 - 99 mg/dL 220(H) 210(H) 123(H)  BUN 8 - 23 mg/dL 15 15 16   Creatinine 0.61 - 1.24 mg/dL 0.84 1.03 1.05  Sodium 135 - 145 mmol/L 142 140 146(H)  Potassium 3.5 - 5.1 mmol/L 4.0 4.4 4.2  Chloride 98 - 111 mmol/L 107 106 111  CO2 22 - 32 mmol/L 26 26 26   Calcium 8.9 - 10.3 mg/dL 9.9 9.4 9.3  Total Protein 6.5 - 8.1 g/dL - - 6.7  Total Bilirubin 0.3 - 1.2 mg/dL - - 0.7  Alkaline Phos 38 - 126 U/L - - 60  AST 15 - 41 U/L - - 18  ALT 0 - 44 U/L - - 18     RADIOGRAPHIC STUDIES: I have personally reviewed the radiological images as listed and agreed with the findings in the report. Ir Imaging Guided Port Insertion  Result Date: 01/15/2018 INDICATION: 62 year old male with diffuse large B-cell lymphoma in need of durable venous access to begin chemotherapy. EXAM: IMPLANTED PORT A CATH PLACEMENT WITH ULTRASOUND AND FLUOROSCOPIC GUIDANCE MEDICATIONS: 2 g Ancef; The antibiotic was  administered within an appropriate time interval prior to skin puncture. ANESTHESIA/SEDATION: Versed 2 mg IV; Fentanyl 100 mcg IV; Moderate Sedation Time:  19 minutes The patient was continuously monitored during the procedure by the interventional radiology nurse under my direct supervision. FLUOROSCOPY TIME:  0 minutes, 24 seconds (20 mGy) COMPLICATIONS: None immediate. PROCEDURE: The right neck and chest was prepped with chlorhexidine, and draped in the usual sterile fashion using maximum barrier technique (cap and mask, sterile gown, sterile gloves, large sterile sheet, hand hygiene and cutaneous antiseptic). Antibiotic prophylaxis was provided with 2g Ancef administered IV one hour prior to skin incision. Local anesthesia was attained by infiltration with 1% lidocaine with epinephrine. Ultrasound demonstrated patency of the right internal jugular vein, and this was documented with an image. Under real-time ultrasound guidance, this vein was accessed with a 21 gauge micropuncture needle and image documentation was performed. A small dermatotomy was made at the access site with an 11 scalpel. A 0.018" wire was advanced into the SVC and the access needle exchanged for a 39F micropuncture vascular sheath. The 0.018" wire was then removed and a 0.035" wire advanced into the IVC. An appropriate location for the subcutaneous reservoir was selected below the clavicle and an incision was made through the skin and underlying soft tissues. The subcutaneous tissues were then dissected using a combination of blunt and sharp surgical technique and a pocket was formed. A single lumen power injectable portacatheter was then tunneled through the subcutaneous tissues from the pocket to the dermatotomy and the port reservoir placed within the subcutaneous pocket. The venous access site was then serially dilated and a peel away vascular sheath placed over the wire. The wire  was removed and the port catheter advanced into position  under fluoroscopic guidance. The catheter tip is positioned in the superior cavoatrial junction. This was documented with a spot image. The portacatheter was then tested and found to flush and aspirate well. The port was flushed with saline followed by 100 units/mL heparinized saline. The pocket was then closed in two layers using first subdermal inverted interrupted absorbable sutures followed by a running subcuticular suture. The epidermis was then sealed with Dermabond. The dermatotomy at the venous access site was also closed with Dermabond. IMPRESSION: Successful placement of a right IJ approach Power Port with ultrasound and fluoroscopic guidance. The catheter is ready for use. Electronically Signed   By: [name] M.D.   On: 01/15/2018 16:49    ASSESSMENT & PLAN:  62 y.o. 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  CT of the abdomen and pelvis performed on 12/22/2017 showed bulky left inguinal, left hemipelvic, and retroperitoneal adenopathy.   01/02/18 Left inguinal LN Biopsy revealed T-Cell/histocyte rich Large B-Cell Lymphoma  12/27/17 ECHO revealed LV EF of 55-60%   01/05/18 PET/CT revealedMassively enlarged pelvic lymph nodes intense metabolic activity consistent lymphoma. 2. Additional hypermetabolic lymph nodes in the porta hepatis and retroperitoneum LEFT aorta. 3. Solitary hypermetabolic mediastinal lymph node in the upper LEFT Mediastinum. 4. 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 5. Normal spleen.  HIV non reactive on 12/22/2017.Hep C and hep B serology negative.  2) left lower extremity swelling- now nearly resolved Doppler ultrasound for DVT was negative in the left lower extremity.  Likely from venous  compression +/- lymphatic obstruction from bulky left inguinal, left hemipelvic, and retroperitoneal adenopathy.   3) Intermediate to high risk for tumor lysis syndrome.- no TLS noted with allopurinol prophylaxis after C1  4) S/p Port a cath placement   PLAN:  -Discussed pt labwork today, 02/07/18; blood counts and chemistries are stable  -The pt has no prohibitive toxicities from continuing C2D3 EPOCH-R at this time.   -Continue eating well, staying hydrated, and walking around as much as reasonably possible   -Okay to hold Allopurinol at this time without concern for tumor lysis syndrome  -Rituxan and neulasta as outpatient on 02/12/2018 -will plan to rpt PET/CT after 3 cycles. -Continue with SCD's   5) DVT prophylaxis -lovenox, SCD, ambulation.  6) HTN -Continue outpatient antihypertensives and monitor  7) Mild hemorrhoidal bleeding. hgb stable. Prn anusol   The total time spent in the appt was 25 minutes and more than 50% was on counseling and direct patient cares.     Sullivan Lone MD MS AAHIVMS [facility] Hematology/Oncology Physician [facility]  (Office):       [phone] (Work cell):  [phone] (Fax):           [phone]  02/07/2018 3:47 PM   I, Baldwin [place], am acting as a scribe for Dr. Irene Limbo  .I have reviewed the above documentation for accuracy and completeness, and I agree with the above. Sullivan Lone MD MS

## 2018-02-08 LAB — BASIC METABOLIC PANEL WITH GFR
CO2: 28 mmol/L (ref 22–32)
Calcium: 9.3 mg/dL (ref 8.9–10.3)
Creatinine, Ser: 0.93 mg/dL (ref 0.61–1.24)
GFR calc Af Amer: >60 mL/min (ref >60)
GFR calc non Af Amer: >60 mL/min (ref >60)
Glucose, Bld: 164 mg/dL — ABNORMAL HIGH (ref 70–99)

## 2018-02-08 LAB — CBC
HCT: 34.5 % — ABNORMAL LOW (ref 39.0–52.0)
Hemoglobin: 11.3 g/dL — ABNORMAL LOW (ref 13.0–17.0)
MCH: 29.4 pg (ref 26.0–34.0)
MCHC: 32.8 g/dL (ref 30.0–36.0)
MCV: 89.8 fL (ref 78.0–100.0)
Platelets: 447 10*3/uL — ABNORMAL HIGH (ref 150–400)
RBC: 3.84 MIL/uL — ABNORMAL LOW (ref 4.22–5.81)
RDW: 14 % (ref 11.5–15.5)
WBC: 5.9 10*3/uL (ref 4.0–10.5)

## 2018-02-08 LAB — BASIC METABOLIC PANEL
Anion gap: 9 (ref 5–15)
BUN: 18 mg/dL (ref 8–23)
Chloride: 103 mmol/L (ref 98–111)
Potassium: 3.4 mmol/L — ABNORMAL LOW (ref 3.5–5.1)
Sodium: 140 mmol/L (ref 135–145)

## 2018-02-08 MED ORDER — VINCRISTINE SULFATE CHEMO INJECTION 1 MG/ML
Freq: Once | INTRAVENOUS | Status: AC
  Administered 2018-02-08: 13:00:00 via INTRAVENOUS
  Filled 2018-02-08: qty 13

## 2018-02-08 MED ORDER — SODIUM CHLORIDE 0.9 % IV SOLN
Freq: Once | INTRAVENOUS | Status: AC
  Administered 2018-02-08: 18 mg via INTRAVENOUS
  Filled 2018-02-08: qty 4

## 2018-02-08 NOTE — Progress Notes (Signed)
HEMATOLOGY/ONCOLOGY INPATIENT PROGRESS NOTE  Date of Service: 02/08/2018  Inpatient Attending: .Brunetta Genera, MD   SUBJECTIVE:   Jesus [name] reports that he is doing well overall.   The pt reports that he has not developed any new concerns. He continues to have good energy levels and is eating well. The pt notes that his tingling in his hands has not worsened. He has also been making sure to walk around.   The pt notes that he has continued to tolerate EPOCH-R very well and denies nausea, vomiting, and diarrhea.   Lab results today (02/08/18) of CBC and BMP is as follows: all values are WNL except for RBC at 3.84, HGB at 11.3, HCT at 34.5, PLT at 447k, Potassium at 3.4, Glucose at 164.  On review of systems, pt reports good energy levels, walking around, eating well, and denies worsening neuropathy, SOB, leg swelling, back pains, abdominal pains, blood in the stools, black stools, nausea, vomiting, diarrhea, skin rashes, and any other symptoms.    OBJECTIVE:  NAD  PHYSICAL EXAMINATION: . Vitals:   02/07/18 1944 02/08/18 0546 02/08/18 1357 02/08/18 2034  BP: (!) 141/82 136/82 (!) 149/80 134/79  Pulse: 97 74 91 86  Resp: 17 18 18 17   Temp: 98.3 F (36.8 C) 98 F (36.7 C) 98.7 F (37.1 C) 98.4 F (36.9 C)  TempSrc: Oral Oral Oral Oral  SpO2: 94% 96% 97% 98%  Weight:      Height:       Filed Weights   02/05/18 1024  Weight: 281 lb 1.4 oz (127.5 kg)   .Body mass index is 38.12 kg/m. Marland Kitchen GENERAL:alert, in no acute distress and comfortable SKIN: no acute rashes, no significant lesions EYES: conjunctiva are pink and non-injected, sclera anicteric OROPHARYNX: MMM, no exudates, no oropharyngeal erythema or ulceration NECK: supple, no JVD LYMPH:  no palpable lymphadenopathy in the cervical, axillary or inguinal regions LUNGS: clear to auscultation b/l with normal respiratory effort HEART: regular rate & rhythm ABDOMEN:  normoactive bowel sounds , non tender,  not distended. Extremity: no pedal edema PSYCH: alert & oriented x 3 with fluent speech NEURO: no focal motor/sensory deficits   MEDICAL HISTORY:  Past Medical History:  Diagnosis Date  . Allergy   . History of kidney stones   . Hyperlipidemia   . Hypertension   . Lymphadenopathy   . Pain, lower leg    Bilateral  . Peripheral arterial disease ([place])   . Pre-diabetes   . Red-green color blindness   . Snores   . Wears glasses     SURGICAL HISTORY: Past Surgical History:  Procedure Laterality Date  . CATARACT EXTRACTION W/ INTRAOCULAR LENS  IMPLANT, BILATERAL    . COLONOSCOPY    . dislodged salava stone    . FRACTURE SURGERY    . HAND ARTHROPLASTY  1995   crushed left hand  . INGUINAL LYMPH NODE BIOPSY Left 01/02/2018   Procedure: LEFT INGUINAL LYMPH NODE BIOPSY;  Surgeon: Rolm Bookbinder, MD;  Location: [facility];  Service: General;  Laterality: Left;  . IR IMAGING GUIDED PORT INSERTION  01/15/2018  . MICROLARYNGOSCOPY Left 01/17/2014   Procedure: MICROLARYNGOSCOPY WITH EXCISION OF THE BIOPSY OF LEFT VOCAL CORD LESION;  Surgeon: Izora Gala, MD;  Location: [facility];  Service: ENT;  Laterality: Left;  . ORIF FOOT FRACTURE  2005   left    SOCIAL HISTORY: Social History   Socioeconomic History  . Marital status: Divorced    Spouse  name: Not on file  . Number of children: 3  . Years of education: Not on file  . Highest education level: Not on file  Occupational History  . Not on file  Social Needs  . Financial resource strain: Not on file  . Food insecurity:    Worry: Not on file    Inability: Not on file  . Transportation needs:    Medical: Not on file    Non-medical: Not on file  Tobacco Use  . Smoking status: Current Every Day Smoker    Packs/day: 0.50    Years: 36.00    Pack years: 18.00    Types: Cigarettes  . Smokeless tobacco: Never Used  Substance and Sexual Activity  . Alcohol use: Yes    Alcohol/week: 15.0 standard drinks    Types:  10 Cans of beer, 5 Shots of liquor per week    Comment: weekends  . Drug use: Yes    Types: Cocaine    Comment: reports cocaine usage ~2X/ month; last use 12/26/17  . Sexual activity: Not on file  Lifestyle  . Physical activity:    Days per week: Not on file    Minutes per session: Not on file  . Stress: Not on file  Relationships  . Social connections:    Talks on phone: Not on file    Gets together: Not on file    Attends religious service: Not on file    Active member of club or organization: Not on file    Attends meetings of clubs or organizations: Not on file    Relationship status: Not on file  . Intimate partner violence:    Fear of current or ex partner: Not on file    Emotionally abused: Not on file    Physically abused: Not on file    Forced sexual activity: Not on file  Other Topics Concern  . Not on file  Social History Narrative  . Not on file    FAMILY HISTORY: Family History  Problem Relation Age of Onset  . Breast cancer Mother   . Diabetes Father   . Hypertension Father   . Stroke Father   . Mental illness Sister   . Hypertension Daughter   . Mental illness Daughter   . Hypertension Brother   . Colon cancer Brother   . Breast cancer Sister     ALLERGIES:  is allergic to bee venom.  MEDICATIONS:  Scheduled Meds: . amLODipine  10 mg Oral Daily  . aspirin EC  81 mg Oral Daily  . DOXOrubicin/vinCRIStine/etoposide CHEMO IV infusion for Inpatient CI   Intravenous Once  . enoxaparin (LOVENOX) injection  40 mg Subcutaneous Q24H  . feeding supplement (ENSURE ENLIVE)  237 mL Oral BID BM  . lisinopril  20 mg Oral Daily  . mupirocin ointment  1 application Nasal BID  . predniSONE  60 mg Oral QAC breakfast   Continuous Infusions: . sodium chloride 20 mL/hr at 02/06/18 1542   PRN Meds:.acetaminophen, alteplase, Cold Pack, heparin lock flush, heparin lock flush, Hot Pack, hydrocortisone, LORazepam, senna-docusate, sodium chloride flush, sodium chloride  flush, sodium chloride flush  REVIEW OF SYSTEMS:    A 10+ POINT REVIEW OF SYSTEMS WAS OBTAINED including neurology, dermatology, psychiatry, cardiac, respiratory, lymph, extremities, GI, GU, Musculoskeletal, constitutional, breasts, reproductive, HEENT.  All pertinent positives are noted in the HPI.  All others are negative.   LABORATORY DATA:  I have reviewed the data as listed  . CBC Latest Ref  Rng & Units 02/08/2018 02/07/2018 02/06/2018  WBC 4.0 - 10.5 K/uL 5.9 11.0(H) 7.1  Hemoglobin 13.0 - 17.0 g/dL 11.3(L) 11.2(L) 11.3(L)  Hematocrit 39.0 - 52.0 % 34.5(L) 34.7(L) 34.5(L)  Platelets 150 - 400 K/uL 447(H) 435(H) 398    . CMP Latest Ref Rng & Units 02/08/2018 02/07/2018 02/06/2018  Glucose 70 - 99 mg/dL 164(H) 220(H) 210(H)  BUN 8 - 23 mg/dL 18 15 15   Creatinine 0.61 - 1.24 mg/dL 0.93 0.84 1.03  Sodium 135 - 145 mmol/L 140 142 140  Potassium 3.5 - 5.1 mmol/L 3.4(L) 4.0 4.4  Chloride 98 - 111 mmol/L 103 107 106  CO2 22 - 32 mmol/L 28 26 26   Calcium 8.9 - 10.3 mg/dL 9.3 9.9 9.4  Total Protein 6.5 - 8.1 g/dL - - -  Total Bilirubin 0.3 - 1.2 mg/dL - - -  Alkaline Phos 38 - 126 U/L - - -  AST 15 - 41 U/L - - -  ALT 0 - 44 U/L - - -     RADIOGRAPHIC STUDIES: I have personally reviewed the radiological images as listed and agreed with the findings in the report. Ir Imaging Guided Port Insertion  Result Date: 01/15/2018 INDICATION: 62 year old male with diffuse large B-cell lymphoma in need of durable venous access to begin chemotherapy. EXAM: IMPLANTED PORT A CATH PLACEMENT WITH ULTRASOUND AND FLUOROSCOPIC GUIDANCE MEDICATIONS: 2 g Ancef; The antibiotic was administered within an appropriate time interval prior to skin puncture. ANESTHESIA/SEDATION: Versed 2 mg IV; Fentanyl 100 mcg IV; Moderate Sedation Time:  19 minutes The patient was continuously monitored during the procedure by the interventional radiology nurse under my direct supervision. FLUOROSCOPY TIME:  0 minutes, 24 seconds  (20 mGy) COMPLICATIONS: None immediate. PROCEDURE: The right neck and chest was prepped with chlorhexidine, and draped in the usual sterile fashion using maximum barrier technique (cap and mask, sterile gown, sterile gloves, large sterile sheet, hand hygiene and cutaneous antiseptic). Antibiotic prophylaxis was provided with 2g Ancef administered IV one hour prior to skin incision. Local anesthesia was attained by infiltration with 1% lidocaine with epinephrine. Ultrasound demonstrated patency of the right internal jugular vein, and this was documented with an image. Under real-time ultrasound guidance, this vein was accessed with a 21 gauge micropuncture needle and image documentation was performed. A small dermatotomy was made at the access site with an 11 scalpel. A 0.018" wire was advanced into the SVC and the access needle exchanged for a 68F micropuncture vascular sheath. The 0.018" wire was then removed and a 0.035" wire advanced into the IVC. An appropriate location for the subcutaneous reservoir was selected below the clavicle and an incision was made through the skin and underlying soft tissues. The subcutaneous tissues were then dissected using a combination of blunt and sharp surgical technique and a pocket was formed. A single lumen power injectable portacatheter was then tunneled through the subcutaneous tissues from the pocket to the dermatotomy and the port reservoir placed within the subcutaneous pocket. The venous access site was then serially dilated and a peel away vascular sheath placed over the wire. The wire was removed and the port catheter advanced into position under fluoroscopic guidance. The catheter tip is positioned in the superior cavoatrial junction. This was documented with a spot image. The portacatheter was then tested and found to flush and aspirate well. The port was flushed with saline followed by 100 units/mL heparinized saline. The pocket was then closed in two layers using  first subdermal inverted interrupted absorbable  sutures followed by a running subcuticular suture. The epidermis was then sealed with Dermabond. The dermatotomy at the venous access site was also closed with Dermabond. IMPRESSION: Successful placement of a right IJ approach Power Port with ultrasound and fluoroscopic guidance. The catheter is ready for use. Electronically Signed   By: [name] M.D.   On: 01/15/2018 16:49    ASSESSMENT & PLAN:  62 y.o. 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  CT of the abdomen and pelvis performed on 12/22/2017 showed bulky left inguinal, left hemipelvic, and retroperitoneal adenopathy.   01/02/18 Left inguinal LN Biopsy revealed T-Cell/histocyte rich Large B-Cell Lymphoma  12/27/17 ECHO revealed LV EF of 55-60%   01/05/18 PET/CT revealedMassively enlarged pelvic lymph nodes intense metabolic activity consistent lymphoma. 2. Additional hypermetabolic lymph nodes in the porta hepatis and retroperitoneum LEFT aorta. 3. Solitary hypermetabolic mediastinal lymph node in the upper LEFT Mediastinum. 4. 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 5. Normal spleen.  HIV non reactive on 12/22/2017.Hep C and hep B serology negative.  2) left lower extremity swelling- now nearly resolved Doppler ultrasound for DVT was negative in the left lower extremity.  Likely from venous compression +/- lymphatic obstruction from bulky left inguinal, left hemipelvic, and retroperitoneal adenopathy.   3) Intermediate to high risk for tumor lysis syndrome.- no TLS noted with allopurinol prophylaxis after C1  4) S/p Port a cath placement   PLAN:  -Discussed pt labwork today, 02/08/18; blood counts and  chemistries are stable  -The pt has no prohibitive toxicities from continuing C2D4 EPOCH-R at this time.   -no nausea/vomiting. -Rituxan and neulasta as outpatient on 02/12/2018 -will plan to rpt PET/CT after 3 cycles. -Continue with SCD's  -discharge tomorrow after completion of EPOCH  5) DVT prophylaxis -lovenox, SCD, ambulation.  6) HTN -Continue outpatient antihypertensives and monitor  7) hemorrhoidal bleeding- resolved. hgb stable. Prn anusol   The total time spent in the appt was 25 minutes and more than 50% was on counseling and direct patient cares.   Sullivan Lone MD MS AAHIVMS [facility] Hematology/Oncology Physician [facility]  (Office):       [phone] (Work cell):  [phone] (Fax):           [phone]  02/08/2018 5:30 PM   I, Baldwin [place], am acting as a scribe for Dr. Irene Limbo  .I have reviewed the above documentation for accuracy and completeness, and I agree with the above. Sullivan Lone MD MS

## 2018-02-09 DIAGNOSIS — R600 Localized edema: Secondary | ICD-10-CM

## 2018-02-09 LAB — BASIC METABOLIC PANEL WITH GFR
Anion gap: 10 (ref 5–15)
BUN: 20 mg/dL (ref 8–23)
Calcium: 9.8 mg/dL (ref 8.9–10.3)
GFR calc non Af Amer: >60 mL/min (ref >60)
Glucose, Bld: 200 mg/dL — ABNORMAL HIGH (ref 70–99)

## 2018-02-09 LAB — CBC
HCT: 31.9 % — ABNORMAL LOW (ref 39.0–52.0)
Hemoglobin: 11 g/dL — ABNORMAL LOW (ref 13.0–17.0)
MCH: 30.3 pg (ref 26.0–34.0)
MCHC: 34.5 g/dL (ref 30.0–36.0)
MCV: 87.9 fL (ref 78.0–100.0)
Platelets: 393 10*3/uL (ref 150–400)
RBC: 3.63 MIL/uL — ABNORMAL LOW (ref 4.22–5.81)
RDW: 13.7 % (ref 11.5–15.5)
WBC: 3.5 10*3/uL — ABNORMAL LOW (ref 4.0–10.5)

## 2018-02-09 LAB — BASIC METABOLIC PANEL
CO2: 29 mmol/L (ref 22–32)
Chloride: 104 mmol/L (ref 98–111)
Creatinine, Ser: 0.98 mg/dL (ref 0.61–1.24)
GFR calc Af Amer: >60 mL/min (ref >60)
Potassium: 3.7 mmol/L (ref 3.5–5.1)
Sodium: 143 mmol/L (ref 135–145)

## 2018-02-09 MED ORDER — SODIUM CHLORIDE 0.9 % IV SOLN
750.0000 mg/m2 | Freq: Once | INTRAVENOUS | Status: AC
  Administered 2018-02-09: 1980 mg via INTRAVENOUS
  Filled 2018-02-09: qty 99

## 2018-02-09 MED ORDER — SODIUM CHLORIDE 0.9 % IV SOLN
Freq: Once | INTRAVENOUS | Status: AC
  Administered 2018-02-09: 36 mg via INTRAVENOUS
  Filled 2018-02-09: qty 8

## 2018-02-09 NOTE — Discharge Summary (Signed)
[facility]  [phone] [phone]    Physician Discharge Summary     Patient ID: Jesus [name] MRN: [id number] DOB/AGE: [birth year] 62 y.o.  Admit date: 02/05/2018 Discharge date: 02/09/2018  Primary Care Physician:  Jesus Frees, MD   Discharge Diagnoses:    Present on Admission: . Diffuse large B cell lymphoma [facility])   Discharge Medications:  Allergies as of 02/09/2018      Reactions   Bee Venom Anaphylaxis      Medication List    STOP taking these medications   calcium carbonate 500 MG chewable tablet Commonly known as:  TUMS - dosed in mg elemental calcium   traMADol 50 MG tablet Commonly known as:  ULTRAM     TAKE these medications   amLODipine 10 MG tablet Commonly known as:  NORVASC Take 10 mg by mouth daily.   aspirin 81 MG tablet Take 81 mg by mouth daily.   CIALIS 20 MG tablet Generic drug:  tadalafil Take 20 mg by mouth daily as needed for erectile dysfunction.   dexamethasone 4 MG tablet Commonly known as:  DECADRON Take 2 tablets (8 mg total) by mouth 2 (two) times daily with a meal. Take two times a day starting the day after chemotherapy for 3 days.   hydrocortisone 2.5 % rectal cream Commonly known as:  ANUSOL-HC Place 1 application rectally 2 (two) times daily as needed for hemorrhoids.   lisinopril 20 MG tablet Commonly known as:  PRINIVIL,ZESTRIL Take 20 mg by mouth daily.   LORazepam 0.5 MG tablet Commonly known as:  ATIVAN Take 1 tablet (0.5 mg total) by mouth every 6 (six) hours as needed (Nausea or vomiting).   multivitamin with minerals tablet Take 1 tablet by mouth daily.   mupirocin ointment 2 % Commonly known as:  BACTROBAN Place 1 application into the nose 2 (two) times daily.   ondansetron 8 MG tablet Commonly known as:  ZOFRAN Take 1 tablet (8 mg total) by mouth every 8 (eight) hours as needed for nausea or vomiting.   prochlorperazine 10 MG tablet Commonly  known as:  COMPAZINE Take 1 tablet (10 mg total) by mouth every 6 (six) hours as needed (Nausea or vomiting).      Disposition and Follow-up:   Significant Diagnostic Studies:  Ir Imaging Guided Port Insertion  Result Date: 01/15/2018 INDICATION: 62 year old male with diffuse large B-cell lymphoma in need of durable venous access to begin chemotherapy. EXAM: IMPLANTED PORT A CATH PLACEMENT WITH ULTRASOUND AND FLUOROSCOPIC GUIDANCE MEDICATIONS: 2 g Ancef; The antibiotic was administered within an appropriate time interval prior to skin puncture. ANESTHESIA/SEDATION: Versed 2 mg IV; Fentanyl 100 mcg IV; Moderate Sedation Time:  19 minutes The patient was continuously monitored during the procedure by the interventional radiology nurse under my direct supervision. FLUOROSCOPY TIME:  0 minutes, 24 seconds (20 mGy) COMPLICATIONS: None immediate. PROCEDURE: The right neck and chest was prepped with chlorhexidine, and draped in the usual sterile fashion using maximum barrier technique (cap and mask, sterile gown, sterile gloves, large sterile sheet, hand hygiene and cutaneous antiseptic). Antibiotic prophylaxis was provided with 2g Ancef administered IV one hour prior to skin incision. Local anesthesia was attained by infiltration with 1% lidocaine with epinephrine. Ultrasound demonstrated patency of the right internal jugular vein, and this was documented with an image. Under real-time ultrasound guidance, this vein was accessed with a 21 gauge micropuncture needle and image documentation was performed. A small dermatotomy was made at the access  site with an 11 scalpel. A 0.018" wire was advanced into the SVC and the access needle exchanged for a 30F micropuncture vascular sheath. The 0.018" wire was then removed and a 0.035" wire advanced into the IVC. An appropriate location for the subcutaneous reservoir was selected below the clavicle and an incision was made through the skin and underlying soft tissues. The  subcutaneous tissues were then dissected using a combination of blunt and sharp surgical technique and a pocket was formed. A single lumen power injectable portacatheter was then tunneled through the subcutaneous tissues from the pocket to the dermatotomy and the port reservoir placed within the subcutaneous pocket. The venous access site was then serially dilated and a peel away vascular sheath placed over the wire. The wire was removed and the port catheter advanced into position under fluoroscopic guidance. The catheter tip is positioned in the superior cavoatrial junction. This was documented with a spot image. The portacatheter was then tested and found to flush and aspirate well. The port was flushed with saline followed by 100 units/mL heparinized saline. The pocket was then closed in two layers using first subdermal inverted interrupted absorbable sutures followed by a running subcuticular suture. The epidermis was then sealed with Dermabond. The dermatotomy at the venous access site was also closed with Dermabond. IMPRESSION: Successful placement of a right IJ approach Power Port with ultrasound and fluoroscopic guidance. The catheter is ready for use. Electronically Signed   By: Jesus [name] M.D.   On: 01/15/2018 16:49    Discharge Laboratory Values: . CBC Latest Ref Rng & Units 02/09/2018 02/08/2018 02/07/2018  WBC 4.0 - 10.5 K/uL 3.5(L) 5.9 11.0(H)  Hemoglobin 13.0 - 17.0 g/dL 11.0(L) 11.3(L) 11.2(L)  Hematocrit 39.0 - 52.0 % 31.9(L) 34.5(L) 34.7(L)  Platelets 150 - 400 K/uL 393 447(H) 435(H)   . CMP Latest Ref Rng & Units 02/09/2018 02/08/2018 02/07/2018  Glucose 70 - 99 mg/dL 200(H) 164(H) 220(H)  BUN 8 - 23 mg/dL 20 18 15   Creatinine 0.61 - 1.24 mg/dL 0.98 0.93 0.84  Sodium 135 - 145 mmol/L 143 140 142  Potassium 3.5 - 5.1 mmol/L 3.7 3.4(L) 4.0  Chloride 98 - 111 mmol/L 104 103 107  CO2 22 - 32 mmol/L 29 28 26   Calcium 8.9 - 10.3 mg/dL 9.8 9.3 9.9  Total Protein 6.5 - 8.1 g/dL - -  -  Total Bilirubin 0.3 - 1.2 mg/dL - - -  Alkaline Phos 38 - 126 U/L - - -  AST 15 - 41 U/L - - -  ALT 0 - 44 U/L - - -     Brief H and P: For complete details please refer to admission H and P, but in brief,   Jesus [name] is a wonderful 62 y.o. male who has been admitted today for C2 EPOCH-R treatment of his T-Cel/histocyte rich Large B-Cell Lymphoma. The pt reports that he has developed no new concerns in the interim and notes that he is ready to begin C2 EPOCH-R.   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  CT of the abdomen and pelvis performed on 12/22/2017 showed bulky left inguinal, left hemipelvic, and retroperitoneal adenopathy.   01/02/18 Left inguinal LN Biopsy revealed T-Cell/histocyte rich Large B-Cell Lymphoma  12/27/17 ECHO revealed LV EF of 55-60%   01/05/18 PET/CT revealedMassively enlarged pelvic lymph nodes intense metabolic activity consistent  lymphoma. 2. Additional hypermetabolic lymph nodes in the porta hepatis and retroperitoneum LEFT aorta. 3. Solitary hypermetabolic mediastinal lymph node in the upper LEFT Mediastinum. 4. 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 5. Normal spleen.  HIV non reactive on 12/22/2017.Hep C and hep B serology negative.  2) left lower extremity swelling- now nearly resolved  Doppler ultrasound for DVT was negative in the left lower extremity.  Likely from venous compression +/- lymphatic obstruction from bulky left inguinal, left hemipelvic, and retroperitoneal adenopathy.   3) Intermediate to high risk for tumor lysis syndrome.- no TLS noted with allopurinol prophylaxis after C1  4) S/p Port a cath placement   PLAN:  - pt has no  prohibitive toxicities from continuing C2 EPOCH-R -no nausea/vomiting. -Rituxan and neulasta as outpatient on 02/12/2018 -will plan to rpt PET/CT after 3 cycles. -leg swelling resolved  5) DVT prophylaxis -lovenox, SCD, ambulation.  6) HTN -Continue outpatient antihypertensives and monitor  7) hemorrhoidal bleeding- resolved. hgb stable. Prn anusol    Physical Exam at Discharge: BP 130/74 (BP Location: Left Arm)   Pulse 81   Temp 98.2 F (36.8 C) (Oral)   Resp 16   Ht 6' (1.829 m)   Wt 281 lb 1.4 oz (127.5 kg)   SpO2 99%   BMI 38.12 kg/m  . GENERAL:alert, in no acute distress and comfortable SKIN: no acute rashes, no significant lesions EYES: conjunctiva are pink and non-injected, sclera anicteric OROPHARYNX: MMM, no exudates, no oropharyngeal erythema or ulceration NECK: supple, no JVD LYMPH:  no palpable lymphadenopathy in the cervical, axillary or inguinal regions LUNGS: clear to auscultation b/l with normal respiratory effort HEART: regular rate & rhythm ABDOMEN:  normoactive bowel sounds , non tender, not distended. Extremity: no pedal edema PSYCH: alert & oriented x 3 with fluent speech NEURO: no focal motor/sensory deficits    Hospital Course:  Active Problems:   Diffuse large B cell lymphoma (HCC)   Diet:  Cardiac healthy diet  Activity:  regular  Condition at Discharge:   Stable  Signed: Dr. Sullivan Lone MD MS [phone]  TT spent discharging patient  >30 mins  02/09/2018, 11:31 AM

## 2018-02-09 NOTE — Progress Notes (Signed)
Pt discharged home in stable condition. Discharge instructions given. No scripts at this time. Pt verbalized understanding of dc instructions. No immediate questions or concerns at this time. Pt opted to ambulate off unit.

## 2018-02-12 ENCOUNTER — Other Ambulatory Visit: Payer: Self-pay | Admitting: Hematology

## 2018-02-12 ENCOUNTER — Inpatient Hospital Stay: Payer: 59 | Attending: Oncology

## 2018-02-12 VITALS — BP 150/71 | HR 91 | Temp 98.1°F | Resp 16

## 2018-02-12 DIAGNOSIS — C8338 Diffuse large B-cell lymphoma, lymph nodes of multiple sites: Secondary | ICD-10-CM

## 2018-02-12 DIAGNOSIS — Z87442 Personal history of urinary calculi: Secondary | ICD-10-CM | POA: Insufficient documentation

## 2018-02-12 DIAGNOSIS — I739 Peripheral vascular disease, unspecified: Secondary | ICD-10-CM | POA: Insufficient documentation

## 2018-02-12 DIAGNOSIS — R600 Localized edema: Secondary | ICD-10-CM | POA: Diagnosis not present

## 2018-02-12 DIAGNOSIS — R32 Unspecified urinary incontinence: Secondary | ICD-10-CM | POA: Insufficient documentation

## 2018-02-12 DIAGNOSIS — Z7689 Persons encountering health services in other specified circumstances: Secondary | ICD-10-CM | POA: Insufficient documentation

## 2018-02-12 DIAGNOSIS — E785 Hyperlipidemia, unspecified: Secondary | ICD-10-CM | POA: Diagnosis not present

## 2018-02-12 DIAGNOSIS — Z23 Encounter for immunization: Secondary | ICD-10-CM | POA: Insufficient documentation

## 2018-02-12 DIAGNOSIS — Z7982 Long term (current) use of aspirin: Secondary | ICD-10-CM | POA: Insufficient documentation

## 2018-02-12 DIAGNOSIS — N401 Enlarged prostate with lower urinary tract symptoms: Secondary | ICD-10-CM | POA: Diagnosis not present

## 2018-02-12 DIAGNOSIS — I10 Essential (primary) hypertension: Secondary | ICD-10-CM | POA: Insufficient documentation

## 2018-02-12 DIAGNOSIS — F17210 Nicotine dependence, cigarettes, uncomplicated: Secondary | ICD-10-CM | POA: Insufficient documentation

## 2018-02-12 DIAGNOSIS — R5383 Other fatigue: Secondary | ICD-10-CM | POA: Diagnosis not present

## 2018-02-12 DIAGNOSIS — Z79899 Other long term (current) drug therapy: Secondary | ICD-10-CM | POA: Diagnosis not present

## 2018-02-12 DIAGNOSIS — R590 Localized enlarged lymph nodes: Secondary | ICD-10-CM | POA: Insufficient documentation

## 2018-02-12 DIAGNOSIS — Z5112 Encounter for antineoplastic immunotherapy: Secondary | ICD-10-CM | POA: Insufficient documentation

## 2018-02-12 DIAGNOSIS — Z7189 Other specified counseling: Secondary | ICD-10-CM

## 2018-02-12 MED ORDER — SODIUM CHLORIDE 0.9 % IV SOLN
375.0000 mg/m2 | Freq: Once | INTRAVENOUS | Status: AC
  Administered 2018-02-12: 1000 mg via INTRAVENOUS
  Filled 2018-02-12: qty 100

## 2018-02-12 MED ORDER — ACETAMINOPHEN 325 MG PO TABS
ORAL_TABLET | ORAL | Status: AC
  Filled 2018-02-12: qty 2

## 2018-02-12 MED ORDER — HEPARIN SOD (PORK) LOCK FLUSH 100 UNIT/ML IV SOLN
500.0000 [IU] | Freq: Once | INTRAVENOUS | Status: AC | PRN
  Administered 2018-02-12: 500 [IU]
  Filled 2018-02-12: qty 5

## 2018-02-12 MED ORDER — SODIUM CHLORIDE 0.9% FLUSH
10.0000 mL | INTRAVENOUS | Status: DC | PRN
  Administered 2018-02-12: 10 mL
  Filled 2018-02-12: qty 10

## 2018-02-12 MED ORDER — PEGFILGRASTIM-CBQV 6 MG/0.6ML ~~LOC~~ SOSY
6.0000 mg | PREFILLED_SYRINGE | Freq: Once | SUBCUTANEOUS | Status: AC
  Administered 2018-02-12: 6 mg via SUBCUTANEOUS

## 2018-02-12 MED ORDER — DIPHENHYDRAMINE HCL 25 MG PO CAPS
ORAL_CAPSULE | ORAL | Status: AC
  Filled 2018-02-12: qty 2

## 2018-02-12 MED ORDER — PEGFILGRASTIM-CBQV 6 MG/0.6ML ~~LOC~~ SOSY
PREFILLED_SYRINGE | SUBCUTANEOUS | Status: AC
  Filled 2018-02-12: qty 0.6

## 2018-02-12 MED ORDER — DIPHENHYDRAMINE HCL 25 MG PO CAPS
50.0000 mg | ORAL_CAPSULE | Freq: Once | ORAL | Status: AC
  Administered 2018-02-12: 50 mg via ORAL

## 2018-02-12 MED ORDER — METHYLPREDNISOLONE SODIUM SUCC 125 MG IJ SOLR
80.0000 mg | Freq: Every day | INTRAMUSCULAR | Status: DC
  Administered 2018-02-12: 80 mg via INTRAVENOUS

## 2018-02-12 MED ORDER — METHYLPREDNISOLONE SODIUM SUCC 125 MG IJ SOLR
INTRAMUSCULAR | Status: AC
  Filled 2018-02-12: qty 2

## 2018-02-12 MED ORDER — SODIUM CHLORIDE 0.9 % IV SOLN
Freq: Once | INTRAVENOUS | Status: AC
  Administered 2018-02-12: 09:00:00 via INTRAVENOUS
  Filled 2018-02-12: qty 250

## 2018-02-12 MED ORDER — ACETAMINOPHEN 325 MG PO TABS
650.0000 mg | ORAL_TABLET | Freq: Once | ORAL | Status: AC
  Administered 2018-02-12: 650 mg via ORAL

## 2018-02-12 NOTE — Patient Instructions (Signed)
[facility] Discharge Instructions for Patients Receiving Chemotherapy  Today you received the following chemotherapy agents: Rituxan.   To help prevent nausea and vomiting after your treatment, we encourage you to take your nausea medication as directed.   If you develop nausea and vomiting that is not controlled by your nausea medication, call the clinic.   BELOW ARE SYMPTOMS THAT SHOULD BE REPORTED IMMEDIATELY:  *FEVER GREATER THAN 100.5 F  *CHILLS WITH OR WITHOUT FEVER  NAUSEA AND VOMITING THAT IS NOT CONTROLLED WITH YOUR NAUSEA MEDICATION  *UNUSUAL SHORTNESS OF BREATH  *UNUSUAL BRUISING OR BLEEDING  TENDERNESS IN MOUTH AND THROAT WITH OR WITHOUT PRESENCE OF ULCERS  *URINARY PROBLEMS  *BOWEL PROBLEMS  UNUSUAL RASH Items with * indicate a potential emergency and should be followed up as soon as possible.  Feel free to call the clinic should you have any questions or concerns. The clinic phone number is [phone].  Please show the [facility] at check-in to the Emergency Department and triage nurse.

## 2018-02-16 ENCOUNTER — Encounter: Payer: Self-pay | Admitting: Hematology

## 2018-02-19 ENCOUNTER — Other Ambulatory Visit: Payer: Self-pay | Admitting: Hematology

## 2018-02-19 DIAGNOSIS — R300 Dysuria: Secondary | ICD-10-CM

## 2018-02-19 DIAGNOSIS — C8338 Diffuse large B-cell lymphoma, lymph nodes of multiple sites: Secondary | ICD-10-CM

## 2018-02-19 NOTE — Progress Notes (Signed)
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 male is here because of left lower extremity edema and lymphadenopathy.  The patient was seen in the emergency room this past Friday for the same issue.  A CT of the abdomen and pelvis was performed showing bulky left inguinal, left hemipelvic, and retroperitoneal adenopathy.  He was referred to [place] from the emergency room for further evaluation.  Doppler ultrasound of the left lower extremity was performed and was negative for DVT. Patient reports that he has been having left lower extremity edema in his left groin and left leg for approximately 1 month.  He states that the swelling in the left groin started to get better but then worsened.  The left lower extremity edema has slowly worsened over time.  Patient denies having fevers and chills.  He reports that he does have night sweats at times.  He reported [place] 1 [place]  He thinks his headaches are related to not having his blood pressure medication.  His headaches have now resolved.  He denies visual changes.  The patient denies chest pain, shortness of breath and cough.  No nausea, vomiting, constipation, diarrhea.  Denies abdominal pain.  The patient denies recent weight loss and has actually gained weight recently.  Patient denies epistaxis, bleeding gums, hemoptysis, hematuria, but occasionally, and melena.  He reports increased urinary frequency over the past month but no dysuria.  The patient is here for evaluation and discussion of his recent CT and lab findings.  Interval History:   Jesus [name] returns today for management and  evaluation of his newly diagnosed T-Cell/histocyte rich Large B-Cell Lymphoma. I last saw the pt on discharge from his C2 EPOCH-R treatment on 02/09/18. [name].   The pt notes that for the last two days he has sensed that he has needed to go to the bathroom, and is not able to make it in time, before urinating. He notes that this resolved yesterday. He denies fevers or chills and denies previous problems with his prostate, and has not had lost control of his bladder prior to these two days. He can feel his bladder being full and knows when he needs to urinate. He has chosen to urinate on a schedule and notes that this loss of control has resolved. He notes that he has not urinated on himself at night. He feels that there is still urine in his bladder after he urinates.   The pt notes that besides feeling tired and having lost his taste for some foods, he is doing well and has no other concerns for infections. The pt notes that he has been eating well this week.  The pt notes that he has not been diagnosed with DM and has been eating more sweets recently. The pt is also taking Lisinopril.   The pt notes that he quit smoking 2 weeks ago and drinking alcohol.   Lab results today (02/20/18) of CBC w/diff, CMP is as follows: all values are WNL except for WBC at 21.0k, RBC at 3.33, HGB at 9.6, HCT at 29.1,  Chloride at 97, Glucose at 232, Creatinine at 1.60, Albumin at 3.2, AST at 67,ALT at 80, GFR at 52. 02/20/18 LDH is pending 02/20/18 Urinalysis revealed amber, cloudy urine with ketones, protein, HGB, some leukocytes, and few bacteria.  02/20/18 Urine culture is pending  On review of systems, pt reports feeling tired, eating well, sense of not emptying bladder, some urinary incontinence, and denies mouth sores, cough, fevers, chills, leg swelling, bone pains, body aches, abdominal pains, and any other symptoms.   MEDICAL HISTORY:  Past Medical History:  Diagnosis  Date  . Allergy   . History of kidney stones   . Hyperlipidemia   . Hypertension   . Lymphadenopathy   . Pain, lower leg    Bilateral  . Peripheral arterial disease ([place])   . Pre-diabetes   . Red-green color blindness   . Snores   . Wears glasses     SURGICAL HISTORY: Past Surgical History:  Procedure Laterality Date  . CATARACT EXTRACTION W/ INTRAOCULAR LENS  IMPLANT, BILATERAL    . COLONOSCOPY    . dislodged salava stone    . FRACTURE SURGERY    . HAND ARTHROPLASTY  1995   crushed left hand  . INGUINAL LYMPH NODE BIOPSY Left 01/02/2018   Procedure: LEFT INGUINAL LYMPH NODE BIOPSY;  Surgeon: Rolm Bookbinder, MD;  Location: [place];  Service: General;  Laterality: Left;  . IR IMAGING GUIDED PORT INSERTION  01/15/2018  . MICROLARYNGOSCOPY Left 01/17/2014   Procedure: MICROLARYNGOSCOPY WITH EXCISION OF THE BIOPSY OF LEFT VOCAL CORD LESION;  Surgeon: Izora Gala, MD;  Location: [place];  Service: ENT;  Laterality: Left;  . ORIF FOOT FRACTURE  2005   left    SOCIAL HISTORY: Social History   Socioeconomic History  . Marital status: Divorced    Spouse name: Not on file  . Number of children: 3  . Years of education: Not on file  . Highest education level: Not on file  Occupational History  . Not on file  Social Needs  . Financial resource strain: Not on file  . Food insecurity:    Worry: Not on file    Inability: Not on file  . Transportation needs:    Medical: Not on file    Non-medical: Not on file  Tobacco Use  . Smoking status: Current Every Day Smoker    Packs/day: 0.50    Years: 36.00    Pack years: 18.00    Types: Cigarettes  . Smokeless tobacco: Never Used  Substance and Sexual Activity  . Alcohol use: Yes    Alcohol/week: 15.0 standard drinks    Types: 10 Cans of beer, 5 Shots of liquor per week    Comment: weekends  . Drug use: Yes    Types: Cocaine    Comment: reports cocaine usage ~2X/ month; last use 12/26/17  . Sexual activity:  Not on file  Lifestyle  . Physical activity:    Days per week: Not on file    Minutes per session: Not on file  . Stress: Not on file  Relationships  . Social connections:    Talks on phone: Not on file    Gets together: Not on file    Attends religious service: Not on file    Active member of club or organization: Not on file    Attends meetings of clubs or organizations: Not on file    Relationship status: Not on file  . Intimate partner violence:    Fear  of current or ex partner: Not on file    Emotionally abused: Not on file    Physically abused: Not on file    Forced sexual activity: Not on file  Other Topics Concern  . Not on file  Social History Narrative  . Not on file    FAMILY HISTORY: Family History  Problem Relation Age of Onset  . Breast cancer Mother   . Diabetes Father   . Hypertension Father   . Stroke Father   . Mental illness Sister   . Hypertension Daughter   . Mental illness Daughter   . Hypertension Brother   . Colon cancer Brother   . Breast cancer Sister     ALLERGIES:  is allergic to bee venom.  MEDICATIONS:  Current Outpatient Medications  Medication Sig Dispense Refill  . amLODipine (NORVASC) 10 MG tablet Take 10 mg by mouth daily.  1  . amoxicillin-clavulanate (AUGMENTIN) 875-125 MG tablet Take 1 tablet by mouth 2 (two) times daily. 20 tablet 0  . aspirin 81 MG tablet Take 81 mg by mouth daily.    Marland Kitchen CIALIS 20 MG tablet Take 20 mg by mouth daily as needed for erectile dysfunction.   0  . dexamethasone (DECADRON) 4 MG tablet Take 2 tablets (8 mg total) by mouth 2 (two) times daily with a meal. Take two times a day starting the day after chemotherapy for 3 days. (Patient not taking: Reported on 02/05/2018) 30 tablet 1  . hydrocortisone (ANUSOL-HC) 2.5 % rectal cream Place 1 application rectally 2 (two) times daily as needed for hemorrhoids. (Patient not taking: Reported on 02/05/2018) 30 g 0  . lisinopril (PRINIVIL,ZESTRIL) 20 MG tablet Take 20  mg by mouth daily.    Marland Kitchen LORazepam (ATIVAN) 0.5 MG tablet Take 1 tablet (0.5 mg total) by mouth every 6 (six) hours as needed (Nausea or vomiting). (Patient not taking: Reported on 02/05/2018) 30 tablet 0  . Multiple Vitamins-Minerals (MULTIVITAMIN WITH MINERALS) tablet Take 1 tablet by mouth daily.    . mupirocin ointment (BACTROBAN) 2 % Place 1 application into the nose 2 (two) times daily. 22 g 0  . ondansetron (ZOFRAN) 8 MG tablet Take 1 tablet (8 mg total) by mouth every 8 (eight) hours as needed for nausea or vomiting. (Patient not taking: Reported on 02/05/2018) 20 tablet 0  . prochlorperazine (COMPAZINE) 10 MG tablet Take 1 tablet (10 mg total) by mouth every 6 (six) hours as needed (Nausea or vomiting). (Patient not taking: Reported on 02/05/2018) 30 tablet 1  . tamsulosin (FLOMAX) 0.4 MG CAPS capsule Take 1 capsule (0.4 mg total) by mouth daily after supper. 30 capsule 1   No current facility-administered medications for this visit.     REVIEW OF SYSTEMS:    A 10+ POINT REVIEW OF SYSTEMS WAS OBTAINED including neurology, dermatology, psychiatry, cardiac, respiratory, lymph, extremities, GI, GU, Musculoskeletal, constitutional, breasts, reproductive, HEENT.  All pertinent positives are noted in the HPI.  All others are negative.   PHYSICAL EXAMINATION: ECOG PERFORMANCE STATUS: 1 - Symptomatic but completely ambulatory  Vitals:   02/20/18 0842  BP: (!) 149/73  Pulse: (!) 103  Resp: 18  Temp: 99 F (37.2 C)  SpO2: 100%   Filed Weights   02/20/18 0842  Weight: 277 lb 9.6 oz (125.9 kg)    GENERAL:alert, in no acute distress and comfortable SKIN: no acute rashes, no significant lesions EYES: conjunctiva are pink and non-injected, sclera anicteric OROPHARYNX: MMM, no exudates, no oropharyngeal erythema or ulceration  NECK: supple, no JVD LYMPH:  no palpable lymphadenopathy in the cervical, axillary or inguinal regions LUNGS: clear to auscultation b/l with normal respiratory  effort HEART: regular rate & rhythm ABDOMEN:  normoactive bowel sounds , non tender, not distended. No palpable hepatosplenomegaly.  Extremity: no pedal edema PSYCH: alert & oriented x 3 with fluent speech NEURO: no focal motor/sensory deficits   LABORATORY DATA:  I have reviewed the data as listed CBC Latest Ref Rng & Units 02/20/2018 02/09/2018 02/08/2018  WBC 4.0 - 10.5 K/uL 21.0(H) 3.5(L) 5.9  Hemoglobin 13.0 - 17.0 g/dL 9.6(L) 11.0(L) 11.3(L)  Hematocrit 39.0 - 52.0 % 29.1(L) 31.9(L) 34.5(L)  Platelets 150 - 400 K/uL 219 393 447(H)    CMP Latest Ref Rng & Units 02/20/2018 02/09/2018 02/08/2018  Glucose 70 - 99 mg/dL 232(H) 200(H) 164(H)  BUN 8 - 23 mg/dL 21 20 18   Creatinine 0.61 - 1.24 mg/dL 1.60(H) 0.98 0.93  Sodium 135 - 145 mmol/L 136 143 140  Potassium 3.5 - 5.1 mmol/L 4.0 3.7 3.4(L)  Chloride 98 - 111 mmol/L 97(L) 104 103  CO2 22 - 32 mmol/L 27 29 28   Calcium 8.9 - 10.3 mg/dL 10.1 9.8 9.3  Total Protein 6.5 - 8.1 g/dL 7.4 - -  Total Bilirubin 0.3 - 1.2 mg/dL 0.5 - -  Alkaline Phos 38 - 126 U/L 121 - -  AST 15 - 41 U/L 67(H) - -  ALT 0 - 44 U/L 80(H) - -   827/19 Left Inguinal LN Bx:   12/26/17 Tissue Flow Cytometry:   12/26/17 Inguinal Core biopsy:    RADIOGRAPHIC STUDIES: I have personally reviewed the radiological images as listed and agreed with the findings in the report. No results found.  ASSESSMENT & PLAN:   This is a pleasant 62 y.o. 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  CT of the abdomen and pelvis performed on 12/22/2017 showed bulky left inguinal, left hemipelvic, and retroperitoneal adenopathy.   01/02/18 Left inguinal LN Biopsy revealed T-Cell/histocyte rich Large  B-Cell Lymphoma  12/27/17 ECHO revealed LV EF of 55-60%   01/05/18 PET/CT revealedMassively enlarged pelvic lymph nodes intense metabolic activity consistent lymphoma. 2. Additional hypermetabolic lymph nodes in the porta hepatis and retroperitoneum LEFT aorta. 3. Solitary hypermetabolic mediastinal lymph node in the upper LEFT Mediastinum. 4. 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 5. Normal spleen.  HIV non reactive on 12/22/2017.Hep C and hep B serology negative.  2) left lower extremity swelling- now nearly resolved Doppler ultrasound for DVT was negative in the left lower extremity.  Likely from venous compression +/- lymphatic obstruction from bulky left inguinal, left hemipelvic, and retroperitoneal adenopathy.   3) Intermediate to high risk for tumor lysis syndrome.- no TLS noted with allopurinol prophylaxis after C1  4) S/p Port a cath placement   5) E.coli UTI - Pansensitive - presenting with dysuria, frequency , urgency and some incontinence. Also appears to have BPH like symptomatology. PLAN: -Discussed pt labwork today, 02/20/18; WBC at 21.0k, HGB at 9.6, blood counts are otherwise stable. Glucose at 232, Creatinine at 1.60.  -Discussed that pt has a UTI -Recommended limiting sweet intake and will closely monitor blood glucose  -Will start pt on Flomax for UTI likely symptomatology and concern for obstructive uropathy -Will start pt on Augmentin for  UTI -Will order PET/CT for completion after C3 -The pt has no prohibitive toxicities from C2 EPOCH-R at this time. -Discussed preventative intrathecal methotrexate as a treatment option for the pt's intermediate risk of CNS recurrence because of his higher risk morphology. Pt is under 65, has good functional status, and does not have extranodal involvement besides some bone involvement. Pt will consider this option in the interim.  -Recommended that the pt continue to eat  well, drink at least 48-64 oz of water each day, and walk 20-30 minutes each day.  -Will begin C3 EPOCH-R with G-CSF support on 02/26/18    5) DVT prophylaxis -lovenox, SCD, ambulation.  6) HTN -Continue outpatient antihypertensives and monitor  7) hemorrhoidal bleeding- resolved. hgb stable. Prn anusol   Orders Placed This Encounter  Procedures  . NM PET Image Restag (PS) Skull Base To Thigh    Standing Status:   Future    Standing Expiration Date:   02/20/2019    Order Specific Question:   ** REASON FOR EXAM (FREE TEXT)    Answer:   Diffuse large B cell lymphoma response assessment after 3 cycles of chemo-immunotherapy    Order Specific Question:   If indicated for the ordered procedure, I authorize the administration of a radiopharmaceutical per Radiology protocol    Answer:   Yes    Order Specific Question:   Preferred imaging location?    Answer:   [facility]    Order Specific Question:   Radiology Contrast Protocol - do NOT remove file path    Answer:   [email or web address]  . CBC with Differential/Platelet    Standing Status:   Future    Standing Expiration Date:   03/27/2019  . CMP ([place] only)    Standing Status:   Future    Standing Expiration Date:   02/21/2019  . Lactate dehydrogenase    Standing Status:   Future    Standing Expiration Date:   02/21/2019    Please arrange inpatient Admission for [facility] from 02/26/2018 for 5 days Please schedule Outpatient Rituxan and Neulasta on 03/05/2018 PET/CT in 18 days RTC with Dr Irene Limbo in 3 weeks with labs   All questions were answered. The patient knows to call the clinic with any problems, questions or concerns.  The total time spent in the appt was 45 minutes and more than 50% was on counseling and direct patient cares.    Sullivan Lone MD MS AAHIVMS [facility] Hematology/Oncology Physician [facility]  (Office):       [phone] (Work cell):   [phone] (Fax):           [phone]  I, Baldwin [place], am acting as a scribe for Dr. Irene Limbo  .I have reviewed the above documentation for accuracy and completeness, and I agree with the above. Brunetta Genera MD

## 2018-02-20 ENCOUNTER — Inpatient Hospital Stay: Payer: 59

## 2018-02-20 ENCOUNTER — Other Ambulatory Visit: Payer: Self-pay

## 2018-02-20 ENCOUNTER — Inpatient Hospital Stay (HOSPITAL_BASED_OUTPATIENT_CLINIC_OR_DEPARTMENT_OTHER): Payer: 59 | Admitting: Hematology

## 2018-02-20 ENCOUNTER — Telehealth: Payer: Self-pay | Admitting: Hematology

## 2018-02-20 VITALS — BP 149/73 | HR 103 | Temp 99.0°F | Resp 18 | Ht 72.0 in | Wt 277.6 lb

## 2018-02-20 DIAGNOSIS — Z7982 Long term (current) use of aspirin: Secondary | ICD-10-CM

## 2018-02-20 DIAGNOSIS — N390 Urinary tract infection, site not specified: Secondary | ICD-10-CM

## 2018-02-20 DIAGNOSIS — R300 Dysuria: Secondary | ICD-10-CM

## 2018-02-20 DIAGNOSIS — R5383 Other fatigue: Secondary | ICD-10-CM | POA: Diagnosis not present

## 2018-02-20 DIAGNOSIS — C8338 Diffuse large B-cell lymphoma, lymph nodes of multiple sites: Secondary | ICD-10-CM | POA: Diagnosis not present

## 2018-02-20 DIAGNOSIS — R3914 Feeling of incomplete bladder emptying: Secondary | ICD-10-CM

## 2018-02-20 DIAGNOSIS — I10 Essential (primary) hypertension: Secondary | ICD-10-CM

## 2018-02-20 DIAGNOSIS — Z87442 Personal history of urinary calculi: Secondary | ICD-10-CM

## 2018-02-20 DIAGNOSIS — R600 Localized edema: Secondary | ICD-10-CM

## 2018-02-20 DIAGNOSIS — B9620 Unspecified Escherichia coli [E. coli] as the cause of diseases classified elsewhere: Secondary | ICD-10-CM

## 2018-02-20 DIAGNOSIS — R590 Localized enlarged lymph nodes: Secondary | ICD-10-CM

## 2018-02-20 DIAGNOSIS — N401 Enlarged prostate with lower urinary tract symptoms: Secondary | ICD-10-CM

## 2018-02-20 DIAGNOSIS — R32 Unspecified urinary incontinence: Secondary | ICD-10-CM

## 2018-02-20 DIAGNOSIS — Z79899 Other long term (current) drug therapy: Secondary | ICD-10-CM

## 2018-02-20 DIAGNOSIS — I739 Peripheral vascular disease, unspecified: Secondary | ICD-10-CM

## 2018-02-20 DIAGNOSIS — E785 Hyperlipidemia, unspecified: Secondary | ICD-10-CM

## 2018-02-20 DIAGNOSIS — F17210 Nicotine dependence, cigarettes, uncomplicated: Secondary | ICD-10-CM

## 2018-02-20 LAB — URINALYSIS, COMPLETE (UACMP) WITH MICROSCOPIC
Bilirubin Urine: NEGATIVE
Glucose, UA: NEGATIVE mg/dL
Ketones, ur: 5 mg/dL — AB
Nitrite: NEGATIVE
Protein, ur: 100 mg/dL — AB
Specific Gravity, Urine: 1.018 (ref 1.005–1.030)
WBC, UA: >50 WBC/hpf — ABNORMAL HIGH (ref 0–5)
pH: 5 (ref 5.0–8.0)

## 2018-02-20 LAB — CMP (CANCER CENTER ONLY)
ALT: 80 U/L — ABNORMAL HIGH (ref 0–44)
AST: 67 U/L — ABNORMAL HIGH (ref 15–41)
Albumin: 3.2 g/dL — ABNORMAL LOW (ref 3.5–5.0)
Alkaline Phosphatase: 121 U/L (ref 38–126)
Anion gap: 12 (ref 5–15)
BUN: 21 mg/dL (ref 8–23)
CO2: 27 mmol/L (ref 22–32)
Calcium: 10.1 mg/dL (ref 8.9–10.3)
Chloride: 97 mmol/L — ABNORMAL LOW (ref 98–111)
Creatinine: 1.6 mg/dL — ABNORMAL HIGH (ref 0.61–1.24)
GFR, Est AFR Am: 52 mL/min — ABNORMAL LOW (ref >60)
GFR, Estimated: 45 mL/min — ABNORMAL LOW (ref >60)
Glucose, Bld: 232 mg/dL — ABNORMAL HIGH (ref 70–99)
Potassium: 4 mmol/L (ref 3.5–5.1)
Sodium: 136 mmol/L (ref 135–145)
Total Bilirubin: 0.5 mg/dL (ref 0.3–1.2)
Total Protein: 7.4 g/dL (ref 6.5–8.1)

## 2018-02-20 LAB — CBC WITH DIFFERENTIAL/PLATELET
Band Neutrophils: 23 %
Basophils Absolute: 0.2 10*3/uL — ABNORMAL HIGH (ref 0.0–0.1)
Basophils Relative: 1 %
Eosinophils Absolute: 0 10*3/uL (ref 0.0–0.5)
Eosinophils Relative: 0 %
HCT: 29.1 % — ABNORMAL LOW (ref 39.0–52.0)
Hemoglobin: 9.6 g/dL — ABNORMAL LOW (ref 13.0–17.0)
Lymphocytes Relative: 4 %
Lymphs Abs: 0.8 10*3/uL (ref 0.7–4.0)
MCH: 28.8 pg (ref 26.0–34.0)
MCHC: 33 g/dL (ref 30.0–36.0)
MCV: 87.4 fL (ref 80.0–100.0)
Metamyelocytes Relative: 3 %
Monocytes Absolute: 2.3 10*3/uL — ABNORMAL HIGH (ref 0.1–1.0)
Monocytes Relative: 11 %
Myelocytes: 2 %
Neutro Abs: 17.6 10*3/uL (ref 1.7–17.7)
Neutrophils Relative %: 56 %
Platelets: 219 10*3/uL (ref 150–400)
RBC: 3.33 MIL/uL — ABNORMAL LOW (ref 4.22–5.81)
RDW: 14.5 % (ref 11.5–15.5)
WBC: 21 10*3/uL — ABNORMAL HIGH (ref 4.0–10.5)
nRBC: 0 % (ref 0.0–0.2)

## 2018-02-20 LAB — LACTATE DEHYDROGENASE: LDH: 302 U/L — ABNORMAL HIGH (ref 98–192)

## 2018-02-20 MED ORDER — AMOXICILLIN-POT CLAVULANATE 875-125 MG PO TABS: 1.0000 | ORAL_TABLET | Freq: Two times a day (BID) | ORAL | 0 refills | Status: DC

## 2018-02-20 MED ORDER — TAMSULOSIN HCL 0.4 MG PO CAPS: 0.4000 mg | ORAL_CAPSULE | Freq: Every day | ORAL | 1 refills | Status: DC

## 2018-02-20 NOTE — Progress Notes (Unsigned)
u

## 2018-02-20 NOTE — Telephone Encounter (Signed)
Gave pt avs and calendar  °

## 2018-02-22 LAB — URINE CULTURE: Culture: >=100000 — AB

## 2018-02-26 ENCOUNTER — Other Ambulatory Visit: Payer: Self-pay

## 2018-02-26 ENCOUNTER — Inpatient Hospital Stay (HOSPITAL_COMMUNITY)
Admission: AD | Admit: 2018-02-26 | Discharge: 2018-03-02 | DRG: 841 | Disposition: A | Discharge status: Home / Self Care | Payer: 59 | Attending: Hematology | Admitting: Hematology

## 2018-02-26 ENCOUNTER — Encounter (HOSPITAL_COMMUNITY): Payer: Self-pay

## 2018-02-26 DIAGNOSIS — Z833 Family history of diabetes mellitus: Secondary | ICD-10-CM | POA: Diagnosis not present

## 2018-02-26 DIAGNOSIS — R7303 Prediabetes: Secondary | ICD-10-CM | POA: Diagnosis present

## 2018-02-26 DIAGNOSIS — D6481 Anemia due to antineoplastic chemotherapy: Secondary | ICD-10-CM | POA: Diagnosis present

## 2018-02-26 DIAGNOSIS — Z9842 Cataract extraction status, left eye: Secondary | ICD-10-CM

## 2018-02-26 DIAGNOSIS — C8510 Unspecified B-cell lymphoma, unspecified site: Secondary | ICD-10-CM | POA: Diagnosis present

## 2018-02-26 DIAGNOSIS — I739 Peripheral vascular disease, unspecified: Secondary | ICD-10-CM | POA: Diagnosis present

## 2018-02-26 DIAGNOSIS — Z5111 Encounter for antineoplastic chemotherapy: Secondary | ICD-10-CM

## 2018-02-26 DIAGNOSIS — Z87892 Personal history of anaphylaxis: Secondary | ICD-10-CM | POA: Diagnosis not present

## 2018-02-26 DIAGNOSIS — Z961 Presence of intraocular lens: Secondary | ICD-10-CM | POA: Diagnosis present

## 2018-02-26 DIAGNOSIS — Z7189 Other specified counseling: Secondary | ICD-10-CM

## 2018-02-26 DIAGNOSIS — Z9841 Cataract extraction status, right eye: Secondary | ICD-10-CM

## 2018-02-26 DIAGNOSIS — D638 Anemia in other chronic diseases classified elsewhere: Secondary | ICD-10-CM

## 2018-02-26 DIAGNOSIS — Z803 Family history of malignant neoplasm of breast: Secondary | ICD-10-CM

## 2018-02-26 DIAGNOSIS — E785 Hyperlipidemia, unspecified: Secondary | ICD-10-CM | POA: Diagnosis present

## 2018-02-26 DIAGNOSIS — R32 Unspecified urinary incontinence: Secondary | ICD-10-CM | POA: Diagnosis present

## 2018-02-26 DIAGNOSIS — F17210 Nicotine dependence, cigarettes, uncomplicated: Secondary | ICD-10-CM | POA: Diagnosis present

## 2018-02-26 DIAGNOSIS — Z8249 Family history of ischemic heart disease and other diseases of the circulatory system: Secondary | ICD-10-CM

## 2018-02-26 DIAGNOSIS — C8338 Diffuse large B-cell lymphoma, lymph nodes of multiple sites: Secondary | ICD-10-CM

## 2018-02-26 DIAGNOSIS — Z87442 Personal history of urinary calculi: Secondary | ICD-10-CM

## 2018-02-26 DIAGNOSIS — N390 Urinary tract infection, site not specified: Secondary | ICD-10-CM | POA: Diagnosis present

## 2018-02-26 DIAGNOSIS — D649 Anemia, unspecified: Secondary | ICD-10-CM

## 2018-02-26 DIAGNOSIS — C7951 Secondary malignant neoplasm of bone: Secondary | ICD-10-CM | POA: Diagnosis present

## 2018-02-26 DIAGNOSIS — Z800 Family history of malignant neoplasm of digestive organs: Secondary | ICD-10-CM | POA: Diagnosis not present

## 2018-02-26 DIAGNOSIS — B9620 Unspecified Escherichia coli [E. coli] as the cause of diseases classified elsewhere: Secondary | ICD-10-CM | POA: Diagnosis not present

## 2018-02-26 DIAGNOSIS — C8330 Diffuse large B-cell lymphoma, unspecified site: Secondary | ICD-10-CM | POA: Diagnosis present

## 2018-02-26 DIAGNOSIS — I10 Essential (primary) hypertension: Secondary | ICD-10-CM | POA: Diagnosis present

## 2018-02-26 DIAGNOSIS — R110 Nausea: Secondary | ICD-10-CM | POA: Diagnosis present

## 2018-02-26 DIAGNOSIS — Z91030 Bee allergy status: Secondary | ICD-10-CM | POA: Diagnosis not present

## 2018-02-26 DIAGNOSIS — H5350 Unspecified color vision deficiencies: Secondary | ICD-10-CM | POA: Diagnosis present

## 2018-02-26 LAB — CBC WITH DIFFERENTIAL/PLATELET
Abs Immature Granulocytes: 0.44 10*3/uL — ABNORMAL HIGH (ref 0.00–0.07)
Basophils Absolute: 0.1 10*3/uL (ref 0.0–0.1)
Basophils Relative: 1 %
Eosinophils Absolute: 0 10*3/uL (ref 0.0–0.5)
Eosinophils Relative: 1 %
HCT: 26.3 % — ABNORMAL LOW (ref 39.0–52.0)
Hemoglobin: 8.3 g/dL — ABNORMAL LOW (ref 13.0–17.0)
Immature Granulocytes: 5 %
Lymphocytes Relative: 11 %
Lymphs Abs: 0.9 10*3/uL (ref 0.7–4.0)
MCH: 29.3 pg (ref 26.0–34.0)
MCHC: 31.6 g/dL (ref 30.0–36.0)
MCV: 92.9 fL (ref 80.0–100.0)
Monocytes Absolute: 1 10*3/uL (ref 0.1–1.0)
Monocytes Relative: 12 %
Neutro Abs: 6.1 10*3/uL (ref 1.7–7.7)
Neutrophils Relative %: 70 %
Platelets: 620 10*3/uL — ABNORMAL HIGH (ref 150–400)
RBC: 2.83 MIL/uL — ABNORMAL LOW (ref 4.22–5.81)
RDW: 15.1 % (ref 11.5–15.5)
WBC: 8.6 10*3/uL (ref 4.0–10.5)
nRBC: 0.2 % (ref 0.0–0.2)

## 2018-02-26 LAB — COMPREHENSIVE METABOLIC PANEL WITH GFR
Anion gap: 7 (ref 5–15)
Calcium: 9.3 mg/dL (ref 8.9–10.3)
GFR calc Af Amer: >60 mL/min (ref >60)
Total Bilirubin: 0.3 mg/dL (ref 0.3–1.2)
Total Protein: 6.5 g/dL (ref 6.5–8.1)

## 2018-02-26 LAB — COMPREHENSIVE METABOLIC PANEL
ALT: 19 U/L (ref 0–44)
AST: 18 U/L (ref 15–41)
Albumin: 3.3 g/dL — ABNORMAL LOW (ref 3.5–5.0)
Alkaline Phosphatase: 69 U/L (ref 38–126)
BUN: 10 mg/dL (ref 8–23)
CO2: 26 mmol/L (ref 22–32)
Chloride: 107 mmol/L (ref 98–111)
Creatinine, Ser: 0.96 mg/dL (ref 0.61–1.24)
GFR calc non Af Amer: >60 mL/min (ref >60)
Glucose, Bld: 177 mg/dL — ABNORMAL HIGH (ref 70–99)
Potassium: 3.8 mmol/L (ref 3.5–5.1)
Sodium: 140 mmol/L (ref 135–145)

## 2018-02-26 LAB — PHOSPHORUS: Phosphorus: 4.4 mg/dL (ref 2.5–4.6)

## 2018-02-26 LAB — MAGNESIUM: Magnesium: 1.6 mg/dL — ABNORMAL LOW (ref 1.7–2.4)

## 2018-02-26 MED ORDER — VINCRISTINE SULFATE CHEMO INJECTION 1 MG/ML
Freq: Once | INTRAVENOUS | Status: AC
  Administered 2018-02-26: 16:00:00 via INTRAVENOUS
  Filled 2018-02-26: qty 13

## 2018-02-26 MED ORDER — PREDNISONE 20 MG PO TABS
60.0000 mg | ORAL_TABLET | Freq: Every day | ORAL | Status: AC
  Administered 2018-02-26 – 2018-03-02 (×5): 60 mg via ORAL
  Filled 2018-02-26 (×5): qty 3

## 2018-02-26 MED ORDER — MAGNESIUM SULFATE IN D5W 1-5 GM/100ML-% IV SOLN
1.0000 g | Freq: Once | INTRAVENOUS | Status: AC
  Administered 2018-02-27: 1 g via INTRAVENOUS
  Filled 2018-02-26: qty 100

## 2018-02-26 MED ORDER — SODIUM CHLORIDE 0.9 % IV SOLN
Freq: Once | INTRAVENOUS | Status: AC
  Administered 2018-02-26: 8 mg via INTRAVENOUS
  Filled 2018-02-26: qty 4

## 2018-02-26 MED ORDER — ASPIRIN EC 81 MG PO TBEC
81.0000 mg | DELAYED_RELEASE_TABLET | Freq: Every day | ORAL | Status: DC
  Administered 2018-02-27 – 2018-03-02 (×4): 81 mg via ORAL
  Filled 2018-02-26 (×4): qty 1

## 2018-02-26 MED ORDER — TAMSULOSIN HCL 0.4 MG PO CAPS
0.4000 mg | ORAL_CAPSULE | Freq: Every day | ORAL | Status: DC
  Administered 2018-02-26 – 2018-03-01 (×4): 0.4 mg via ORAL
  Filled 2018-02-26 (×4): qty 1

## 2018-02-26 MED ORDER — HYDROCORTISONE 2.5 % RE CREA: 1.0000 "application " | TOPICAL_CREAM | Freq: Two times a day (BID) | RECTAL | Status: DC | PRN

## 2018-02-26 MED ORDER — ENOXAPARIN SODIUM 40 MG/0.4ML ~~LOC~~ SOLN
40.0000 mg | SUBCUTANEOUS | Status: DC
  Administered 2018-02-26 – 2018-03-01 (×4): 40 mg via SUBCUTANEOUS
  Filled 2018-02-26 (×4): qty 0.4

## 2018-02-26 MED ORDER — SENNOSIDES-DOCUSATE SODIUM 8.6-50 MG PO TABS: 1.0000 | ORAL_TABLET | Freq: Every evening | ORAL | Status: DC | PRN

## 2018-02-26 MED ORDER — ACETAMINOPHEN 325 MG PO TABS: 650.0000 mg | ORAL_TABLET | ORAL | Status: DC | PRN

## 2018-02-26 MED ORDER — AMOXICILLIN-POT CLAVULANATE 875-125 MG PO TABS
1.0000 | ORAL_TABLET | Freq: Two times a day (BID) | ORAL | Status: AC
  Administered 2018-02-26 – 2018-03-02 (×8): 1 via ORAL
  Filled 2018-02-26 (×8): qty 1

## 2018-02-26 MED ORDER — LORAZEPAM 0.5 MG PO TABS: 0.5000 mg | ORAL_TABLET | Freq: Four times a day (QID) | ORAL | Status: DC | PRN

## 2018-02-26 MED ORDER — LISINOPRIL 20 MG PO TABS
20.0000 mg | ORAL_TABLET | Freq: Every day | ORAL | Status: DC
  Administered 2018-02-27 – 2018-03-02 (×4): 20 mg via ORAL
  Filled 2018-02-26 (×4): qty 1

## 2018-02-26 MED ORDER — SODIUM CHLORIDE 0.9 % IV SOLN
INTRAVENOUS | Status: DC
  Administered 2018-02-26: 16:00:00 via INTRAVENOUS

## 2018-02-26 MED ORDER — AMLODIPINE BESYLATE 10 MG PO TABS
10.0000 mg | ORAL_TABLET | Freq: Every day | ORAL | Status: DC
  Administered 2018-02-27 – 2018-03-02 (×4): 10 mg via ORAL
  Filled 2018-02-26 (×4): qty 1

## 2018-02-26 MED ORDER — MAGNESIUM OXIDE 400 (241.3 MG) MG PO TABS
400.0000 mg | ORAL_TABLET | Freq: Two times a day (BID) | ORAL | Status: DC
  Administered 2018-02-27 – 2018-03-02 (×8): 400 mg via ORAL
  Filled 2018-02-26 (×8): qty 1

## 2018-02-26 NOTE — Progress Notes (Signed)
Per Dr. Irene Limbo, okay to start chemotherapy with hemoglobin = 8.3. He is also aware of magnesium level of 1.6 and will order replacement as needed.  Demetrius Charity, PharmD, [place] Oncology Pharmacist Pharmacy Phone: [phone] 02/26/2018

## 2018-02-26 NOTE — Progress Notes (Signed)
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 male who has been admitted today for C3 EPOCH-R treatment of his T-cell rich B-cell lymphoma. [name].   The pt reports that he has had some mild nausea. The pt notes that his previous urinary symptoms have resolved after treatment of his UTI and notes that his urination has returned to baseline. The pt also notes that his abdomen has not had any pain.   The pt notes that he has been feeling tired but denies light headedness and dizziness. He has not had any problems breathing.   Lab results today (02/26/18) of CBC w/diff, CMP is as follows: all values are WNL except for RBC at 2.83, HGB at 8.3, HCT at 26.3, PLT at 620k, Glucose at 177, Albumin at 3.3.  On review of systems, pt reports mild nausea, normalized urination, feeling tired, breathing well, and denies urinary discomfort, abdominal pains, light headedness, dizziness, abdominal pains, leg swelling, and any other symptoms.   MEDICAL HISTORY:  Past Medical History:  Diagnosis Date  . Allergy   . History of kidney stones   . Hyperlipidemia   . Hypertension   . Lymphadenopathy   . Pain, lower leg    Bilateral  . Peripheral arterial disease ([employer])   . Pre-diabetes   . Red-green color blindness   . Snores   . Wears glasses     SURGICAL HISTORY: Past Surgical History:  Procedure Laterality Date  . CATARACT EXTRACTION W/ INTRAOCULAR LENS  IMPLANT, BILATERAL    . COLONOSCOPY    . dislodged salava stone    . FRACTURE SURGERY    . HAND ARTHROPLASTY  1995   crushed left hand  . INGUINAL LYMPH NODE BIOPSY Left 01/02/2018   Procedure: LEFT INGUINAL LYMPH NODE BIOPSY;  Surgeon: Rolm Bookbinder, MD;  Location: [place];  Service: General;  Laterality: Left;  . IR IMAGING GUIDED PORT INSERTION  01/15/2018  . MICROLARYNGOSCOPY Left 01/17/2014   Procedure: MICROLARYNGOSCOPY WITH EXCISION OF THE BIOPSY OF LEFT VOCAL CORD LESION;  Surgeon: Izora Gala, MD;  Location: [place];  Service: ENT;  Laterality: Left;  . ORIF FOOT FRACTURE  2005   left    SOCIAL HISTORY: Social History   Socioeconomic History  . Marital status: Divorced    Spouse name: Not on file  . Number of children: 3  . Years of education: Not on file  . Highest education level: Not on file  Occupational History  . Not on file  Social Needs  . Financial resource strain: Not on file  . Food insecurity:    Worry: Not on file    Inability: Not on file  . Transportation needs:    Medical: Not on file    Non-medical: Not on file  Tobacco Use  . Smoking status: Current Every Day Smoker    Packs/day: 0.50    Years: 36.00    Pack years: 18.00    Types: Cigarettes  . Smokeless tobacco: Never Used  Substance and Sexual Activity  . Alcohol use: Yes    Alcohol/week: 15.0 standard drinks    Types: 10 Cans of beer, 5 Shots of liquor per week    Comment:  weekends  . Drug use: Yes    Types: Cocaine    Comment: reports cocaine usage ~2X/ month; last use 12/26/17  . Sexual activity: Not on file  Lifestyle  . Physical activity:    Days per week: Not on file    Minutes per session: Not on file  . Stress: Not on file  Relationships  . Social connections:    Talks on phone: Not on file    Gets together: Not on file    Attends religious service: Not on file    Active member of club or organization: Not on file    Attends meetings of clubs or organizations: Not on file    Relationship status: Not on file  . Intimate partner violence:    Fear of current or ex partner: Not on file    Emotionally abused: Not on file    Physically abused: Not on file    Forced sexual activity: Not on file  Other  Topics Concern  . Not on file  Social History Narrative  . Not on file    FAMILY HISTORY: Family History  Problem Relation Age of Onset  . Breast cancer Mother   . Diabetes Father   . Hypertension Father   . Stroke Father   . Mental illness Sister   . Hypertension Daughter   . Mental illness Daughter   . Hypertension Brother   . Colon cancer Brother   . Breast cancer Sister     ALLERGIES:  is allergic to bee venom.  MEDICATIONS:  Current Facility-Administered Medications  Medication Dose Route Frequency Provider Last Rate Last Dose  . 0.9 %  sodium chloride infusion   Intravenous Continuous Brunetta Genera, MD 10 mL/hr at 02/26/18 1544    . acetaminophen (TYLENOL) tablet 650 mg  650 mg Oral Q4H PRN Brunetta Genera, MD      . Derrill Memo ON 02/27/2018] amLODipine (NORVASC) tablet 10 mg  10 mg Oral Daily Brunetta Genera, MD      . amoxicillin-clavulanate (AUGMENTIN) 875-125 MG per tablet 1 tablet  1 tablet Oral BID Brunetta Genera, MD      . Derrill Memo ON 02/27/2018] aspirin EC tablet 81 mg  81 mg Oral Daily Brunetta Genera, MD      . DOXOrubicin (ADRIAMYCIN) 26 mg, etoposide (VEPESID) 132 mg, vinCRIStine (ONCOVIN) 1 mg in sodium chloride 0.9 % 1,000 mL chemo infusion   Intravenous Once Brunetta Genera, MD 51 mL/hr at 02/26/18 1623    . enoxaparin (LOVENOX) injection 40 mg  40 mg Subcutaneous Q24H Brunetta Genera, MD   40 mg at 02/26/18 1056  . [START ON 02/27/2018] lisinopril (PRINIVIL,ZESTRIL) tablet 20 mg  20 mg Oral Daily Brunetta Genera, MD      . predniSONE (DELTASONE) tablet 60 mg  60 mg Oral QAC breakfast Brunetta Genera, MD      . senna-docusate (Senokot-S) tablet 1 tablet  1 tablet Oral QHS PRN Brunetta Genera, MD      . tamsulosin (FLOMAX) capsule 0.4 mg  0.4 mg Oral QPC supper Brunetta Genera, MD        REVIEW OF SYSTEMS:    10 Point review of Systems was done is negative except as noted above.  PHYSICAL  EXAMINATION: ECOG PERFORMANCE STATUS: 1 - Symptomatic but completely ambulatory  . Vitals:   02/26/18 0954 02/26/18 1436  BP: 105/71 116/66  Pulse: 97 92  Resp: 18 16  Temp: 97.9 F (36.6  C) 98.4 F (36.9 C)  SpO2: 96% 97%   Filed Weights   02/26/18 0954  Weight: 282 lb 1.6 oz (128 kg)   .Body mass index is 38.26 kg/m.  GENERAL:alert, in no acute distress and comfortable SKIN: no acute rashes, no significant lesions EYES: conjunctiva are pink and non-injected, sclera anicteric OROPHARYNX: MMM, no exudates, no oropharyngeal erythema or ulceration NECK: supple, no JVD LYMPH:  no palpable lymphadenopathy in the cervical, axillary or inguinal regions LUNGS: clear to auscultation b/l with normal respiratory effort HEART: regular rate & rhythm ABDOMEN:  normoactive bowel sounds , non tender, not distended. Extremity:trace pedal edema PSYCH: alert & oriented x 3 with fluent speech NEURO: no focal motor/sensory deficits  LABORATORY DATA:  I have reviewed the data as listed  . CBC Latest Ref Rng & Units 02/26/2018 02/20/2018 02/09/2018  WBC 4.0 - 10.5 K/uL 8.6 21.0(H) 3.5(L)  Hemoglobin 13.0 - 17.0 g/dL 8.3(L) 9.6(L) 11.0(L)  Hematocrit 39.0 - 52.0 % 26.3(L) 29.1(L) 31.9(L)  Platelets 150 - 400 K/uL 620(H) 219 393    . CMP Latest Ref Rng & Units 02/26/2018 02/20/2018 02/09/2018  Glucose 70 - 99 mg/dL 177(H) 232(H) 200(H)  BUN 8 - 23 mg/dL 10 21 20   Creatinine 0.61 - 1.24 mg/dL 0.96 1.60(H) 0.98  Sodium 135 - 145 mmol/L 140 136 143  Potassium 3.5 - 5.1 mmol/L 3.8 4.0 3.7  Chloride 98 - 111 mmol/L 107 97(L) 104  CO2 22 - 32 mmol/L 26 27 29   Calcium 8.9 - 10.3 mg/dL 9.3 10.1 9.8  Total Protein 6.5 - 8.1 g/dL 6.5 7.4 -  Total Bilirubin 0.3 - 1.2 mg/dL 0.3 0.5 -  Alkaline Phos 38 - 126 U/L 69 121 -  AST 15 - 41 U/L 18 67(H) -  ALT 0 - 44 U/L 19 80(H) -   Component     Latest Ref Rng & Units 02/26/2018  Magnesium     1.7 - 2.4 mg/dL 1.6 (L)  Phosphorus     2.5 - 4.6  mg/dL 4.4     RADIOGRAPHIC STUDIES: I have personally reviewed the radiological images as listed and agreed with the findings in the report. No results found.  ASSESSMENT & PLAN:  62 y.o. 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  CT of the abdomen and pelvis performed on 12/22/2017 showed bulky left inguinal, left hemipelvic, and retroperitoneal adenopathy.   01/02/18 Left inguinal LN Biopsy revealed T-Cell/histocyte rich Large B-Cell Lymphoma  12/27/17 ECHO revealed LV EF of 55-60%   01/05/18 PET/CT revealedMassively enlarged pelvic lymph nodes intense metabolic activity consistent lymphoma. 2. Additional hypermetabolic lymph nodes in the porta hepatis and retroperitoneum LEFT aorta. 3. Solitary hypermetabolic mediastinal lymph node in the upper LEFT Mediastinum. 4. 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 5. Normal spleen.  HIV non reactive on 12/22/2017.Hep C and hep B serology negative.  2) left lower extremity swelling- now nearly resolved Doppler ultrasound for DVT was negative in the left lower extremity.  Likely from venous compression +/- lymphatic obstruction from bulky left inguinal, left hemipelvic, and retroperitoneal adenopathy.   3) Intermediate to high risk for tumor lysis syndrome.- no TLS noted with allopurinol prophylaxis after C1  4) S/p Port a cath placement   5) E.coli UTI - Pansensitive - presenting with dysuria, frequency , urgency and some incontinence. Also appears to have  BPH like symptomatology.  PLAN: -Recommended limiting sweet intake and will closely monitor blood glucose  -Will order PET/CT for completion after C3 -Discussed preventative intrathecal methotrexate as a treatment  option for the pt's intermediate risk of CNS recurrence because of his higher risk morphology. Pt not decided about this yet. -Discussed pt labwork today, 02/26/18; HGB lower at 8.3, PLT higher at 620k -Will replace magnesium IV and po -prn PRBC transfusion if hgb<8 or if patient symptomatic -The pt has no prohibitive toxicities from continuing C3D1 EPOCH-R with G-CSF support at this time.   -outpatient neulasta and Rituxan on 10/28 -Continue eating well, staying hydrated, and staying as active as reasonably possible  -Nexium for nausea -Will complete 10 day course of antibiotics for recent UTI  5) DVT prophylaxis -lovenox, SCD, ambulation.  6) HTN -Continue outpatient antihypertensives and monitor  7) hemorrhoidal bleeding- resolved. hgb stable. Prn anusol   All of the patients questions were answered with apparent satisfaction. The patient knows to call the clinic with any problems, questions or concerns.  The total time spent in the appt was 70 minutes and more than 50% was on counseling and direct patient cares, chemotherapy ordering and co-ordination with pharmacist.    Sullivan Lone MD [place] AAHIVMS [facility] Hematology/Oncology Physician [facility]  (Office):       [phone] (Work cell):  [phone] (Fax):           [phone]  02/26/2018 4:33 PM  I, Baldwin [place], am acting as a scribe for Dr. Irene Limbo  .I have reviewed the above documentation for accuracy and completeness, and I agree with the above. Sullivan Lone MD MS

## 2018-02-26 NOTE — Progress Notes (Signed)
Chemotherapy dosage verified with Drue Dun, RN.

## 2018-02-27 LAB — BASIC METABOLIC PANEL
Anion gap: 7 (ref 5–15)
Calcium: 8.9 mg/dL (ref 8.9–10.3)
Chloride: 105 mmol/L (ref 98–111)
GFR calc Af Amer: >60 mL/min (ref >60)
Potassium: 4.3 mmol/L (ref 3.5–5.1)
Sodium: 137 mmol/L (ref 135–145)

## 2018-02-27 LAB — MAGNESIUM: Magnesium: 1.9 mg/dL (ref 1.7–2.4)

## 2018-02-27 LAB — ABO/RH: ABO/RH(D): A POS

## 2018-02-27 LAB — BASIC METABOLIC PANEL WITH GFR
BUN: 14 mg/dL (ref 8–23)
CO2: 25 mmol/L (ref 22–32)
Creatinine, Ser: 1.01 mg/dL (ref 0.61–1.24)
GFR calc non Af Amer: >60 mL/min (ref >60)
Glucose, Bld: 322 mg/dL — ABNORMAL HIGH (ref 70–99)

## 2018-02-27 LAB — CBC
HCT: 26.5 % — ABNORMAL LOW (ref 39.0–52.0)
Hemoglobin: 8.4 g/dL — ABNORMAL LOW (ref 13.0–17.0)
MCH: 29.2 pg (ref 26.0–34.0)
MCHC: 31.7 g/dL (ref 30.0–36.0)
MCV: 92 fL (ref 80.0–100.0)
Platelets: 693 10*3/uL — ABNORMAL HIGH (ref 150–400)
RBC: 2.88 MIL/uL — ABNORMAL LOW (ref 4.22–5.81)
RDW: 15 % (ref 11.5–15.5)
WBC: 13.6 10*3/uL — ABNORMAL HIGH (ref 4.0–10.5)
nRBC: 0 % (ref 0.0–0.2)

## 2018-02-27 MED ORDER — VINCRISTINE SULFATE CHEMO INJECTION 1 MG/ML
Freq: Once | INTRAVENOUS | Status: AC
  Administered 2018-02-27: 15:00:00 via INTRAVENOUS
  Filled 2018-02-27: qty 13

## 2018-02-27 MED ORDER — SODIUM CHLORIDE 0.9 % IV SOLN
Freq: Once | INTRAVENOUS | Status: AC
  Administered 2018-02-27: 18 mg via INTRAVENOUS
  Filled 2018-02-27: qty 4

## 2018-02-27 MED ORDER — CALCIUM CARBONATE ANTACID 500 MG PO CHEW
1.0000 | CHEWABLE_TABLET | Freq: Four times a day (QID) | ORAL | Status: DC | PRN
  Administered 2018-02-27 – 2018-02-28 (×2): 400 mg via ORAL
  Administered 2018-03-01: 200 mg via ORAL
  Administered 2018-03-01: 400 mg via ORAL
  Filled 2018-02-27: qty 1
  Filled 2018-02-27 (×3): qty 2

## 2018-02-27 NOTE — Progress Notes (Signed)
Chemo calculations verified with Nancy Marus, RN.

## 2018-02-27 NOTE — Progress Notes (Signed)
HEMATOLOGY/ONCOLOGY INPATIENT PROGRESS NOTE  Date of Service: 02/27/2018  Inpatient Attending: .Brunetta Genera, MD   SUBJECTIVE:   Jesus [name] is accompanied today by his daughter, brother and sister in law. [name].   The pt reports that he has been feeling much better today and denies any body pains or aches. He has been eating well and endorses good energy levels.   Lab results today (02/27/18) of CBC and BMP is as follows: all values are WNL except for WBC at 13.6k, RBC at 2.88, HGB at 8.4, HCT at 26.5, PLT at 693k, Glucose at 322.  On review of systems, pt reports good energy levels, eating well, and denies nausea, abdominal pains, leg swelling, and any other symptoms.   OBJECTIVE:  NAD  PHYSICAL EXAMINATION: . Vitals:   02/26/18 2042 02/27/18 0614 02/27/18 1018 02/27/18 1430  BP: 140/85 128/74 133/80 124/68  Pulse: 84 89 85 93  Resp: 19 17  16   Temp: 98 F (36.7 C) 98.3 F (36.8 C)  98.1 F (36.7 C)  TempSrc: Oral Oral  Oral  SpO2: 98% 97%  100%  Weight:      Height:       Filed Weights   02/26/18 0954  Weight: 282 lb 1.6 oz (128 kg)   .Body mass index is 38.26 kg/m.  GENERAL:alert, in no acute distress and comfortable SKIN: no acute rashes, no significant lesions EYES: conjunctiva are pink and non-injected, sclera anicteric OROPHARYNX: MMM, no exudates, no oropharyngeal erythema or ulceration NECK: supple, no JVD LYMPH:  no palpable lymphadenopathy in the cervical, axillary or inguinal regions LUNGS: clear to auscultation b/l with normal respiratory effort HEART: regular rate & rhythm ABDOMEN:  normoactive bowel sounds , non tender, not distended. No palpable hepatosplenomegaly.  Extremity: trace pedal edema PSYCH: alert & oriented x 3 with fluent speech NEURO: no focal motor/sensory deficits   MEDICAL HISTORY:  Past Medical History:  Diagnosis Date  . Allergy   . History of kidney stones   .  Hyperlipidemia   . Hypertension   . Lymphadenopathy   . Pain, lower leg    Bilateral  . Peripheral arterial disease ([place])   . Pre-diabetes   . Red-green color blindness   . Snores   . Wears glasses     SURGICAL HISTORY: Past Surgical History:  Procedure Laterality Date  . CATARACT EXTRACTION W/ INTRAOCULAR LENS  IMPLANT, BILATERAL    . COLONOSCOPY    . dislodged salava stone    . FRACTURE SURGERY    . HAND ARTHROPLASTY  1995   crushed left hand  . INGUINAL LYMPH NODE BIOPSY Left 01/02/2018   Procedure: LEFT INGUINAL LYMPH NODE BIOPSY;  Surgeon: Rolm Bookbinder, MD;  Location: [place];  Service: General;  Laterality: Left;  . IR IMAGING GUIDED PORT INSERTION  01/15/2018  . MICROLARYNGOSCOPY Left 01/17/2014   Procedure: MICROLARYNGOSCOPY WITH EXCISION OF THE BIOPSY OF LEFT VOCAL CORD LESION;  Surgeon: Izora Gala, MD;  Location: [place];  Service: ENT;  Laterality: Left;  . ORIF FOOT FRACTURE  2005   left    SOCIAL HISTORY: Social History   Socioeconomic History  . Marital status: Divorced    Spouse name: Not on file  . Number of children: 3  . Years of education: Not on file  . Highest education level: Not on file  Occupational History  . Not on file  Social Needs  . Emergency planning/management officer  strain: Not on file  . Food insecurity:    Worry: Not on file    Inability: Not on file  . Transportation needs:    Medical: Not on file    Non-medical: Not on file  Tobacco Use  . Smoking status: Current Every Day Smoker    Packs/day: 0.50    Years: 36.00    Pack years: 18.00    Types: Cigarettes  . Smokeless tobacco: Never Used  Substance and Sexual Activity  . Alcohol use: Yes    Alcohol/week: 15.0 standard drinks    Types: 10 Cans of beer, 5 Shots of liquor per week    Comment: weekends  . Drug use: Yes    Types: Cocaine    Comment: reports cocaine usage ~2X/ month; last use 12/26/17  . Sexual activity: Not on file  Lifestyle  . Physical activity:     Days per week: Not on file    Minutes per session: Not on file  . Stress: Not on file  Relationships  . Social connections:    Talks on phone: Not on file    Gets together: Not on file    Attends religious service: Not on file    Active member of club or organization: Not on file    Attends meetings of clubs or organizations: Not on file    Relationship status: Not on file  . Intimate partner violence:    Fear of current or ex partner: Not on file    Emotionally abused: Not on file    Physically abused: Not on file    Forced sexual activity: Not on file  Other Topics Concern  . Not on file  Social History Narrative  . Not on file    FAMILY HISTORY: Family History  Problem Relation Age of Onset  . Breast cancer Mother   . Diabetes Father   . Hypertension Father   . Stroke Father   . Mental illness Sister   . Hypertension Daughter   . Mental illness Daughter   . Hypertension Brother   . Colon cancer Brother   . Breast cancer Sister     ALLERGIES:  is allergic to bee venom.  MEDICATIONS:  Scheduled Meds: . amLODipine  10 mg Oral Daily  . amoxicillin-clavulanate  1 tablet Oral BID  . aspirin EC  81 mg Oral Daily  . DOXOrubicin/vinCRIStine/etoposide CHEMO IV infusion for Inpatient CI   Intravenous Once  . enoxaparin (LOVENOX) injection  40 mg Subcutaneous Q24H  . lisinopril  20 mg Oral Daily  . magnesium oxide  400 mg Oral BID  . predniSONE  60 mg Oral QAC breakfast  . tamsulosin  0.4 mg Oral QPC supper   Continuous Infusions: . sodium chloride 10 mL/hr at 02/27/18 1000   PRN Meds:.acetaminophen, senna-docusate  REVIEW OF SYSTEMS:    10 Point review of Systems was done is negative except as noted above.   LABORATORY DATA:  I have reviewed the data as listed  . CBC Latest Ref Rng & Units 02/27/2018 02/26/2018 02/20/2018  WBC 4.0 - 10.5 K/uL 13.6(H) 8.6 21.0(H)  Hemoglobin 13.0 - 17.0 g/dL 8.4(L) 8.3(L) 9.6(L)  Hematocrit 39.0 - 52.0 % 26.5(L) 26.3(L)  29.1(L)  Platelets 150 - 400 K/uL 693(H) 620(H) 219    . CMP Latest Ref Rng & Units 02/27/2018 02/27/2018 02/26/2018  Glucose 70 - 99 mg/dL 322(H) QUESTIONABLE RESULTS, RECOMMEND RECOLLECT TO VERIFY 177(H)  BUN 8 - 23 mg/dL 14 QUESTIONABLE RESULTS, RECOMMEND RECOLLECT TO VERIFY  10  Creatinine 0.61 - 1.24 mg/dL 1.01 QUESTIONABLE RESULTS, RECOMMEND RECOLLECT TO VERIFY 0.96  Sodium 135 - 145 mmol/L 137 QUESTIONABLE RESULTS, RECOMMEND RECOLLECT TO VERIFY 140  Potassium 3.5 - 5.1 mmol/L 4.3 QUESTIONABLE RESULTS, RECOMMEND RECOLLECT TO VERIFY 3.8  Chloride 98 - 111 mmol/L 105 QUESTIONABLE RESULTS, RECOMMEND RECOLLECT TO VERIFY 107  CO2 22 - 32 mmol/L 25 QUESTIONABLE RESULTS, RECOMMEND RECOLLECT TO VERIFY 26  Calcium 8.9 - 10.3 mg/dL 8.9 QUESTIONABLE RESULTS, RECOMMEND RECOLLECT TO VERIFY 9.3  Total Protein 6.5 - 8.1 g/dL - - 6.5  Total Bilirubin 0.3 - 1.2 mg/dL - - 0.3  Alkaline Phos 38 - 126 U/L - - 69  AST 15 - 41 U/L - - 18  ALT 0 - 44 U/L - - 19     RADIOGRAPHIC STUDIES: I have personally reviewed the radiological images as listed and agreed with the findings in the report. No results found.  ASSESSMENT & PLAN:  62 y.o. 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  CT of the abdomen and pelvis performed on 12/22/2017 showed bulky left inguinal, left hemipelvic, and retroperitoneal adenopathy.   01/02/18 Left inguinal LN Biopsy revealed T-Cell/histocyte rich Large B-Cell Lymphoma  12/27/17 ECHO revealed LV EF of 55-60%   01/05/18 PET/CT revealedMassively enlarged pelvic lymph nodes intense metabolic activity consistent lymphoma. 2. Additional hypermetabolic lymph nodes in the porta hepatis and retroperitoneum LEFT aorta. 3. Solitary hypermetabolic mediastinal lymph node in  the upper LEFT Mediastinum. 4. 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 5. Normal spleen.  HIV non reactive on 12/22/2017.Hep C and hep B serology negative.  2) left lower extremity swelling- now nearly resolved Doppler ultrasound for DVT was negative in the left lower extremity.  Likely from venous compression +/- lymphatic obstruction from bulky left inguinal, left hemipelvic, and retroperitoneal adenopathy.   3) Intermediate to high risk for tumor lysis syndrome.- no TLS noted with allopurinol prophylaxis after C1  4) S/p Port a cath placement   5) E.coli UTI - Pansensitive - presenting with dysuria, frequency , urgency and some incontinence. Also appears to have BPH like symptomatology.  6) Normocytic anemia - due to chemotherapy. Not symptomatic at this time. Will monitor  PLAN: -Recommended limiting sweet intake and will closely monitor blood glucose  -Will order PET/CT for completion after C3 -Discussed preventative intrathecal methotrexate as a treatment option for the pt's intermediate risk of CNS recurrence because of his higher risk morphology. Pt not decided about this yet, will consider after PET/CT. -Will replace magnesium IV and po -prn PRBC transfusion if hgb<8 or if patient symptomatic  -outpatient neulasta and Rituxan on 10/28 -Nexium for nausea -Will complete 10 day course of antibiotics for recent UTI -Discussed pt labwork today, 02/27/18; blood counts  -The pt has no prohibitive toxicities from continuing C3D2 EPOCH-R at this time.   -Continue eating well, staying hydrated, and staying as active as reasonably possible   5) DVT prophylaxis -lovenox, SCD, ambulation.  6) HTN -Continue outpatient antihypertensives and monitor  7) hemorrhoidal bleeding- resolved. hgb stable. Prn anusol   The total time spent in the appt was 25 minutes and more than 50% was on counseling and direct patient cares.     Sullivan Lone MD [place] AAHIVMS [facility] Hematology/Oncology Physician [facility]  (Office):       [phone] (Work cell):  [phone] (Fax):           [phone]  02/27/2018 3:53 PM   I, Baldwin [place], am acting as a scribe for Dr. Irene Limbo  .I have reviewed the above documentation for accuracy and completeness, and I agree with the above. Sullivan Lone MD MS

## 2018-02-27 NOTE — H&P (Signed)
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 male who has been admitted today for C3 EPOCH-R treatment of his T-cell rich B-cell lymphoma. [name].   The pt reports that he has had some mild nausea. The pt notes that his previous urinary symptoms have resolved after treatment of his UTI and notes that his urination has returned to baseline. The pt also notes that his abdomen has not had any pain.   The pt notes that he has been feeling tired but denies light headedness and dizziness. He has not had any problems breathing.   Lab results today (02/26/18) of CBC w/diff, CMP is as follows: all values are WNL except for RBC at 2.83, HGB at 8.3, HCT at 26.3, PLT at 620k, Glucose at 177, Albumin at 3.3.  On review of systems, pt reports mild nausea, normalized urination, feeling tired, breathing well, and denies urinary discomfort, abdominal pains, light headedness, dizziness, abdominal pains, leg swelling, and any other symptoms.   MEDICAL HISTORY:  Past Medical History:  Diagnosis Date  . Allergy   . History of kidney stones   . Hyperlipidemia   . Hypertension   . Lymphadenopathy   . Pain, lower leg    Bilateral  . Peripheral arterial disease ([place])   . Pre-diabetes   . Red-green color blindness   . Snores   . Wears glasses     SURGICAL HISTORY: Past Surgical History:  Procedure Laterality Date  . CATARACT EXTRACTION W/ INTRAOCULAR LENS  IMPLANT, BILATERAL    . COLONOSCOPY    . dislodged salava stone    . FRACTURE SURGERY    . HAND ARTHROPLASTY  1995   crushed left hand  . INGUINAL LYMPH NODE BIOPSY Left 01/02/2018   Procedure: LEFT INGUINAL LYMPH NODE BIOPSY;  Surgeon: Rolm Bookbinder, MD;  Location: [place];  Service: General;  Laterality: Left;  . IR IMAGING GUIDED PORT INSERTION  01/15/2018  . MICROLARYNGOSCOPY Left 01/17/2014   Procedure: MICROLARYNGOSCOPY WITH EXCISION OF THE BIOPSY OF LEFT VOCAL CORD LESION;  Surgeon: Izora Gala, MD;  Location: [place];  Service: ENT;  Laterality: Left;  . ORIF FOOT FRACTURE  2005   left    SOCIAL HISTORY: Social History   Socioeconomic History  . Marital status: Divorced    Spouse name: Not on file  . Number of children: 3  . Years of education: Not on file  . Highest education level: Not on file  Occupational History  . Not on file  Social Needs  . Financial resource strain: Not on file  . Food insecurity:    Worry: Not on file    Inability: Not on file  . Transportation needs:    Medical: Not on file    Non-medical: Not on file  Tobacco Use  . Smoking status: Current Every Day Smoker    Packs/day: 0.50    Years: 36.00    Pack years: 18.00    Types: Cigarettes  . Smokeless tobacco: Never Used  Substance and Sexual Activity  . Alcohol use: Yes    Alcohol/week: 15.0 standard drinks    Types: 10 Cans of beer, 5 Shots of liquor per week    Comment:  weekends  . Drug use: Yes    Types: Cocaine    Comment: reports cocaine usage ~2X/ month; last use 12/26/17  . Sexual activity: Not on file  Lifestyle  . Physical activity:    Days per week: Not on file    Minutes per session: Not on file  . Stress: Not on file  Relationships  . Social connections:    Talks on phone: Not on file    Gets together: Not on file    Attends religious service: Not on file    Active member of club or organization: Not on file    Attends meetings of clubs or organizations: Not on file    Relationship status: Not on file  . Intimate partner violence:    Fear of current or ex partner: Not on file    Emotionally abused: Not on file    Physically abused: Not on file    Forced sexual activity: Not on file  Other  Topics Concern  . Not on file  Social History Narrative  . Not on file    FAMILY HISTORY: Family History  Problem Relation Age of Onset  . Breast cancer Mother   . Diabetes Father   . Hypertension Father   . Stroke Father   . Mental illness Sister   . Hypertension Daughter   . Mental illness Daughter   . Hypertension Brother   . Colon cancer Brother   . Breast cancer Sister     ALLERGIES:  is allergic to bee venom.  MEDICATIONS:  Current Facility-Administered Medications  Medication Dose Route Frequency Provider Last Rate Last Dose  . 0.9 %  sodium chloride infusion   Intravenous Continuous Brunetta Genera, MD 10 mL/hr at 02/26/18 1827    . acetaminophen (TYLENOL) tablet 650 mg  650 mg Oral Q4H PRN Brunetta Genera, MD      . amLODipine (NORVASC) tablet 10 mg  10 mg Oral Daily Brunetta Genera, MD      . amoxicillin-clavulanate (AUGMENTIN) 875-125 MG per tablet 1 tablet  1 tablet Oral BID Brunetta Genera, MD   1 tablet at 02/26/18 2113  . aspirin EC tablet 81 mg  81 mg Oral Daily Brunetta Genera, MD      . DOXOrubicin (ADRIAMYCIN) 26 mg, etoposide (VEPESID) 132 mg, vinCRIStine (ONCOVIN) 1 mg in sodium chloride 0.9 % 1,000 mL chemo infusion   Intravenous Once Brunetta Genera, MD 51 mL/hr at 02/26/18 1623    . enoxaparin (LOVENOX) injection 40 mg  40 mg Subcutaneous Q24H Brunetta Genera, MD   40 mg at 02/26/18 1056  . lisinopril (PRINIVIL,ZESTRIL) tablet 20 mg  20 mg Oral Daily Brunetta Genera, MD      . magnesium oxide (MAG-OX) tablet 400 mg  400 mg Oral BID Brunetta Genera, MD      . magnesium sulfate IVPB 1 g 100 mL  1 g Intravenous Once Brunetta Genera, MD      . predniSONE (DELTASONE) tablet 60 mg  60 mg Oral QAC breakfast Brunetta Genera, MD   60 mg at 02/26/18 1753  . senna-docusate (Senokot-S) tablet 1 tablet  1 tablet Oral QHS PRN Brunetta Genera, MD      . tamsulosin [facility]) capsule 0.4 mg  0.4 mg Oral QPC supper  Brunetta Genera, MD   0.4 mg at 02/26/18 1753    REVIEW OF SYSTEMS:    10 Point review of Systems was done is  negative except as noted above.  PHYSICAL EXAMINATION: ECOG PERFORMANCE STATUS: 1 - Symptomatic but completely ambulatory  . Vitals:   02/26/18 1436 02/26/18 2042  BP: 116/66 140/85  Pulse: 92 84  Resp: 16 19  Temp: 98.4 F (36.9 C) 98 F (36.7 C)  SpO2: 97% 98%   Filed Weights   02/26/18 0954  Weight: 282 lb 1.6 oz (128 kg)   .Body mass index is 38.26 kg/m.  GENERAL:alert, in no acute distress and comfortable SKIN: no acute rashes, no significant lesions EYES: conjunctiva are pink and non-injected, sclera anicteric OROPHARYNX: MMM, no exudates, no oropharyngeal erythema or ulceration NECK: supple, no JVD LYMPH:  no palpable lymphadenopathy in the cervical, axillary or inguinal regions LUNGS: clear to auscultation b/l with normal respiratory effort HEART: regular rate & rhythm ABDOMEN:  normoactive bowel sounds , non tender, not distended. Extremity:trace pedal edema PSYCH: alert & oriented x 3 with fluent speech NEURO: no focal motor/sensory deficits  LABORATORY DATA:  I have reviewed the data as listed  . CBC Latest Ref Rng & Units 02/26/2018 02/20/2018 02/09/2018  WBC 4.0 - 10.5 K/uL 8.6 21.0(H) 3.5(L)  Hemoglobin 13.0 - 17.0 g/dL 8.3(L) 9.6(L) 11.0(L)  Hematocrit 39.0 - 52.0 % 26.3(L) 29.1(L) 31.9(L)  Platelets 150 - 400 K/uL 620(H) 219 393    . CMP Latest Ref Rng & Units 02/26/2018 02/20/2018 02/09/2018  Glucose 70 - 99 mg/dL 177(H) 232(H) 200(H)  BUN 8 - 23 mg/dL 10 21 20   Creatinine 0.61 - 1.24 mg/dL 0.96 1.60(H) 0.98  Sodium 135 - 145 mmol/L 140 136 143  Potassium 3.5 - 5.1 mmol/L 3.8 4.0 3.7  Chloride 98 - 111 mmol/L 107 97(L) 104  CO2 22 - 32 mmol/L 26 27 29   Calcium 8.9 - 10.3 mg/dL 9.3 10.1 9.8  Total Protein 6.5 - 8.1 g/dL 6.5 7.4 -  Total Bilirubin 0.3 - 1.2 mg/dL 0.3 0.5 -  Alkaline Phos 38 - 126 U/L 69 121 -  AST 15 - 41 U/L  18 67(H) -  ALT 0 - 44 U/L 19 80(H) -   Component     Latest Ref Rng & Units 02/26/2018  Magnesium     1.7 - 2.4 mg/dL 1.6 (L)  Phosphorus     2.5 - 4.6 mg/dL 4.4     RADIOGRAPHIC STUDIES: I have personally reviewed the radiological images as listed and agreed with the findings in the report. No results found.  ASSESSMENT & PLAN:  62 y.o. 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  CT of the abdomen and pelvis performed on 12/22/2017 showed bulky left inguinal, left hemipelvic, and retroperitoneal adenopathy.   01/02/18 Left inguinal LN Biopsy revealed T-Cell/histocyte rich Large B-Cell Lymphoma  12/27/17 ECHO revealed LV EF of 55-60%   01/05/18 PET/CT revealedMassively enlarged pelvic lymph nodes intense metabolic activity consistent lymphoma. 2. Additional hypermetabolic lymph nodes in the porta hepatis and retroperitoneum LEFT aorta. 3. Solitary hypermetabolic mediastinal lymph node in the upper LEFT Mediastinum. 4. 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 5. Normal spleen.  HIV non reactive on 12/22/2017.Hep C and hep B serology negative.  2) left lower extremity swelling- now nearly resolved Doppler ultrasound for DVT was negative in the left lower extremity.  Likely from venous compression +/- lymphatic obstruction from bulky left inguinal, left hemipelvic, and retroperitoneal adenopathy.   3) Intermediate  to high risk for tumor lysis syndrome.- no TLS noted with allopurinol prophylaxis after C1  4) S/p Port a cath placement   5) E.coli UTI - Pansensitive - presenting with dysuria, frequency , urgency and some incontinence. Also appears to have BPH like symptomatology.  PLAN: -Recommended limiting  sweet intake and will closely monitor blood glucose  -Will order PET/CT for completion after C3 -Discussed preventative intrathecal methotrexate as a treatment option for the pt's intermediate risk of CNS recurrence because of his higher risk morphology. Pt not decided about this yet. -Discussed pt labwork today, 02/26/18; HGB lower at 8.3, PLT higher at 620k -Will replace magnesium IV and po -prn PRBC transfusion if hgb<8 or if patient symptomatic -The pt has no prohibitive toxicities from continuing C3D1 EPOCH-R with G-CSF support at this time.   -outpatient neulasta and Rituxan on 10/28 -Continue eating well, staying hydrated, and staying as active as reasonably possible  -Nexium for nausea -Will complete 10 day course of antibiotics for recent UTI  5) DVT prophylaxis -lovenox, SCD, ambulation.  6) HTN -Continue outpatient antihypertensives and monitor  7) hemorrhoidal bleeding- resolved. hgb stable. Prn anusol   All of the patients questions were answered with apparent satisfaction. The patient knows to call the clinic with any problems, questions or concerns.  The total time spent in the appt was 70 minutes and more than 50% was on counseling and direct patient cares, chemotherapy ordering and co-ordination with pharmacist.    Sullivan Lone MD [place] AAHIVMS [facility] Hematology/Oncology Physician [facility]  (Office):       [phone] (Work cell):  [phone] (Fax):           [phone]  02/27/2018 12:54 AM  I, Baldwin Jamaica, am acting as a scribe for Dr. Irene Limbo  .I have reviewed the above documentation for accuracy and completeness, and I agree with the above. Sullivan Lone MD MS

## 2018-02-28 LAB — CBC
HCT: 25.2 % — ABNORMAL LOW (ref 39.0–52.0)
Hemoglobin: 8.1 g/dL — ABNORMAL LOW (ref 13.0–17.0)
MCH: 30 pg (ref 26.0–34.0)
MCHC: 32.1 g/dL (ref 30.0–36.0)
MCV: 93.3 fL (ref 80.0–100.0)
Platelets: 651 10*3/uL — ABNORMAL HIGH (ref 150–400)
RBC: 2.7 MIL/uL — ABNORMAL LOW (ref 4.22–5.81)
RDW: 15.3 % (ref 11.5–15.5)
WBC: 17.9 10*3/uL — ABNORMAL HIGH (ref 4.0–10.5)
nRBC: 0 % (ref 0.0–0.2)

## 2018-02-28 LAB — BASIC METABOLIC PANEL
BUN: 18 mg/dL (ref 8–23)
CO2: 26 mmol/L (ref 22–32)
Calcium: 8.9 mg/dL (ref 8.9–10.3)
Chloride: 107 mmol/L (ref 98–111)
Creatinine, Ser: 0.9 mg/dL (ref 0.61–1.24)
GFR calc Af Amer: >60 mL/min (ref >60)
GFR calc non Af Amer: >60 mL/min (ref >60)
Sodium: 141 mmol/L (ref 135–145)

## 2018-02-28 LAB — BASIC METABOLIC PANEL WITH GFR
Anion gap: 8 (ref 5–15)
Glucose, Bld: 321 mg/dL — ABNORMAL HIGH (ref 70–99)
Potassium: 4.2 mmol/L (ref 3.5–5.1)

## 2018-02-28 MED ORDER — HEPARIN SOD (PORK) LOCK FLUSH 100 UNIT/ML IV SOLN
500.0000 [IU] | INTRAVENOUS | Status: AC | PRN
  Administered 2018-03-02: 500 [IU]

## 2018-02-28 MED ORDER — SODIUM CHLORIDE 0.9 % IV SOLN
Freq: Once | INTRAVENOUS | Status: AC
  Administered 2018-02-28: 8 mg via INTRAVENOUS
  Filled 2018-02-28: qty 4

## 2018-02-28 MED ORDER — VINCRISTINE SULFATE CHEMO INJECTION 1 MG/ML
Freq: Once | INTRAVENOUS | Status: AC
  Administered 2018-02-28: 14:00:00 via INTRAVENOUS
  Filled 2018-02-28: qty 13

## 2018-02-28 NOTE — Progress Notes (Signed)
HEMATOLOGY/ONCOLOGY INPATIENT PROGRESS NOTE  Date of Service: 02/28/2018  Inpatient Attending: .Brunetta Genera, MD   SUBJECTIVE:   Jesus [name] reports that he is doing well overall and continues tolerating EPOCH-R very well. .   The pt reports that his urination habits have been completely normal. He endorses very good energy levels and has been eating well.   Lab results today (02/28/18) of CBC and BMP is as follows: all values are WNL except for WBC at 17.9k, RBC at 2.70, HGB at 8.1, HCT at 25.2, PLT at 651k, Glucose at 321.  On review of systems, pt reports good energy levels, breathing well, eating well, moving his bowels well, and denies problems urinating, discomfort urinating, leg swelling, abdominal pains, and any other symptoms.   OBJECTIVE:  NAD  PHYSICAL EXAMINATION: . Vitals:   02/27/18 1430 02/27/18 2156 02/28/18 0526 02/28/18 1341  BP: 124/68 134/89 (!) 124/93 (!) 136/94  Pulse: 93 84 81 79  Resp: 16 16 20 20   Temp: 98.1 F (36.7 C) 98.1 F (36.7 C) 97.8 F (36.6 C) 98 F (36.7 C)  TempSrc: Oral Oral Oral Oral  SpO2: 100% 99% 100% 99%  Weight:      Height:       Filed Weights   02/26/18 0954  Weight: 282 lb 1.6 oz (128 kg)   .Body mass index is 38.26 kg/m.  GENERAL:alert, in no acute distress and comfortable SKIN: no acute rashes, no significant lesions EYES: conjunctiva are pink and non-injected, sclera anicteric OROPHARYNX: MMM, no exudates, no oropharyngeal erythema or ulceration NECK: supple, no JVD LYMPH:  no palpable lymphadenopathy in the cervical, axillary or inguinal regions LUNGS: clear to auscultation b/l with normal respiratory effort HEART: regular rate & rhythm ABDOMEN:  normoactive bowel sounds , non tender, not distended. No palpable hepatosplenomegaly.  Extremity: trace pedal edema PSYCH: alert & oriented x 3 with fluent speech NEURO: no focal motor/sensory deficits   MEDICAL HISTORY:  Past Medical History:    Diagnosis Date  . Allergy   . History of kidney stones   . Hyperlipidemia   . Hypertension   . Lymphadenopathy   . Pain, lower leg    Bilateral  . Peripheral arterial disease ([place])   . Pre-diabetes   . Red-green color blindness   . Snores   . Wears glasses     SURGICAL HISTORY: Past Surgical History:  Procedure Laterality Date  . CATARACT EXTRACTION W/ INTRAOCULAR LENS  IMPLANT, BILATERAL    . COLONOSCOPY    . dislodged salava stone    . FRACTURE SURGERY    . HAND ARTHROPLASTY  1995   crushed left hand  . INGUINAL LYMPH NODE BIOPSY Left 01/02/2018   Procedure: LEFT INGUINAL LYMPH NODE BIOPSY;  Surgeon: Rolm Bookbinder, MD;  Location: [place];  Service: General;  Laterality: Left;  . IR IMAGING GUIDED PORT INSERTION  01/15/2018  . MICROLARYNGOSCOPY Left 01/17/2014   Procedure: MICROLARYNGOSCOPY WITH EXCISION OF THE BIOPSY OF LEFT VOCAL CORD LESION;  Surgeon: Izora Gala, MD;  Location: [place];  Service: ENT;  Laterality: Left;  . ORIF FOOT FRACTURE  2005   left    SOCIAL HISTORY: Social History   Socioeconomic History  . Marital status: Divorced    Spouse name: Not on file  . Number of children: 3  . Years of education: Not on file  . Highest education level: Not on file  Occupational History  . Not on file  Social Needs  .  Financial resource strain: Not on file  . Food insecurity:    Worry: Not on file    Inability: Not on file  . Transportation needs:    Medical: Not on file    Non-medical: Not on file  Tobacco Use  . Smoking status: Current Every Day Smoker    Packs/day: 0.50    Years: 36.00    Pack years: 18.00    Types: Cigarettes  . Smokeless tobacco: Never Used  Substance and Sexual Activity  . Alcohol use: Yes    Alcohol/week: 15.0 standard drinks    Types: 10 Cans of beer, 5 Shots of liquor per week    Comment: weekends  . Drug use: Yes    Types: Cocaine    Comment: reports cocaine usage ~2X/ month; last use 12/26/17  .  Sexual activity: Not on file  Lifestyle  . Physical activity:    Days per week: Not on file    Minutes per session: Not on file  . Stress: Not on file  Relationships  . Social connections:    Talks on phone: Not on file    Gets together: Not on file    Attends religious service: Not on file    Active member of club or organization: Not on file    Attends meetings of clubs or organizations: Not on file    Relationship status: Not on file  . Intimate partner violence:    Fear of current or ex partner: Not on file    Emotionally abused: Not on file    Physically abused: Not on file    Forced sexual activity: Not on file  Other Topics Concern  . Not on file  Social History Narrative  . Not on file    FAMILY HISTORY: Family History  Problem Relation Age of Onset  . Breast cancer Mother   . Diabetes Father   . Hypertension Father   . Stroke Father   . Mental illness Sister   . Hypertension Daughter   . Mental illness Daughter   . Hypertension Brother   . Colon cancer Brother   . Breast cancer Sister     ALLERGIES:  is allergic to bee venom.  MEDICATIONS:  Scheduled Meds: . amLODipine  10 mg Oral Daily  . amoxicillin-clavulanate  1 tablet Oral BID  . aspirin EC  81 mg Oral Daily  . DOXOrubicin/vinCRIStine/etoposide CHEMO IV infusion for Inpatient CI   Intravenous Once  . enoxaparin (LOVENOX) injection  40 mg Subcutaneous Q24H  . lisinopril  20 mg Oral Daily  . magnesium oxide  400 mg Oral BID  . predniSONE  60 mg Oral QAC breakfast  . tamsulosin  0.4 mg Oral QPC supper   Continuous Infusions: . sodium chloride 10 mL/hr at 02/28/18 1509   PRN Meds:.acetaminophen, calcium carbonate, heparin lock flush, senna-docusate  REVIEW OF SYSTEMS:    A 10+ POINT REVIEW OF SYSTEMS WAS OBTAINED including neurology, dermatology, psychiatry, cardiac, respiratory, lymph, extremities, GI, GU, Musculoskeletal, constitutional, breasts, reproductive, HEENT.  All pertinent positives  are noted in the HPI.  All others are negative.   LABORATORY DATA:  I have reviewed the data as listed  . CBC Latest Ref Rng & Units 02/28/2018 02/27/2018 02/26/2018  WBC 4.0 - 10.5 K/uL 17.9(H) 13.6(H) 8.6  Hemoglobin 13.0 - 17.0 g/dL 8.1(L) 8.4(L) 8.3(L)  Hematocrit 39.0 - 52.0 % 25.2(L) 26.5(L) 26.3(L)  Platelets 150 - 400 K/uL 651(H) 693(H) 620(H)    . CMP Latest Ref Rng &  Units 02/28/2018 02/27/2018 02/27/2018  Glucose 70 - 99 mg/dL 321(H) 322(H) QUESTIONABLE RESULTS, RECOMMEND RECOLLECT TO VERIFY  BUN 8 - 23 mg/dL 18 14 QUESTIONABLE RESULTS, RECOMMEND RECOLLECT TO VERIFY  Creatinine 0.61 - 1.24 mg/dL 0.90 1.01 QUESTIONABLE RESULTS, RECOMMEND RECOLLECT TO VERIFY  Sodium 135 - 145 mmol/L 141 137 QUESTIONABLE RESULTS, RECOMMEND RECOLLECT TO VERIFY  Potassium 3.5 - 5.1 mmol/L 4.2 4.3 QUESTIONABLE RESULTS, RECOMMEND RECOLLECT TO VERIFY  Chloride 98 - 111 mmol/L 107 105 QUESTIONABLE RESULTS, RECOMMEND RECOLLECT TO VERIFY  CO2 22 - 32 mmol/L 26 25 QUESTIONABLE RESULTS, RECOMMEND RECOLLECT TO VERIFY  Calcium 8.9 - 10.3 mg/dL 8.9 8.9 QUESTIONABLE RESULTS, RECOMMEND RECOLLECT TO VERIFY  Total Protein 6.5 - 8.1 g/dL - - -  Total Bilirubin 0.3 - 1.2 mg/dL - - -  Alkaline Phos 38 - 126 U/L - - -  AST 15 - 41 U/L - - -  ALT 0 - 44 U/L - - -     RADIOGRAPHIC STUDIES: I have personally reviewed the radiological images as listed and agreed with the findings in the report. No results found.  ASSESSMENT & PLAN:  62 y.o. 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  CT of the abdomen and pelvis performed on 12/22/2017 showed bulky left inguinal, left hemipelvic, and retroperitoneal adenopathy.   01/02/18 Left inguinal LN Biopsy revealed T-Cell/histocyte rich Large B-Cell  Lymphoma  12/27/17 ECHO revealed LV EF of 55-60%   01/05/18 PET/CT revealedMassively enlarged pelvic lymph nodes intense metabolic activity consistent lymphoma. 2. Additional hypermetabolic lymph nodes in the porta hepatis and retroperitoneum LEFT aorta. 3. Solitary hypermetabolic mediastinal lymph node in the upper LEFT Mediastinum. 4. 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 5. Normal spleen.  HIV non reactive on 12/22/2017.Hep C and hep B serology negative.  2) left lower extremity swelling- now nearly resolved Doppler ultrasound for DVT was negative in the left lower extremity.  Likely from venous compression +/- lymphatic obstruction from bulky left inguinal, left hemipelvic, and retroperitoneal adenopathy.   3) Intermediate to high risk for tumor lysis syndrome.- no TLS noted with allopurinol prophylaxis after C1  4) S/p Port a cath placement   5) E.coli UTI - Pansensitive - presenting with dysuria, frequency , urgency and some incontinence. Also appears to have BPH like symptomatology.  PLAN: -Recommended limiting sweet intake and will closely monitor blood glucose  -Will order PET/CT for completion after C3 -l replace magnesium IV and po -prn PRBC transfusion if hgb<8 or if patient symptomatic  -outpatient neulasta and Rituxan on 10/28 -Nexium for nausea -Will complete 10 day course of antibiotics for recent UTI -Discussed pt labwork today, 02/28/18; WBC at 17.9k, likely from steroids, and will keep any eye on this -The pt has no prohibitive toxicities from continuing C3D3 EPOCH-R at this time.   -Continue eating well, staying hydrated, and staying as active as reasonably possible    5) DVT prophylaxis -lovenox, SCD, ambulation.  6) HTN -Continue outpatient antihypertensives and monitor  7) hemorrhoidal bleeding- resolved. hgb stable. Prn anusol   The total time spent in the appt was 25 minutes and more  than 50% was on counseling and direct patient cares.    Sullivan Lone MD [place] AAHIVMS [facility] Hematology/Oncology Physician [facility]  (Office):       [phone] (Work cell):  [phone] (Fax):  [phone]  02/28/2018 3:53 PM   I, Baldwin Jamaica, am acting as a scribe for Dr. Irene Limbo  .I have reviewed the above documentation for accuracy and completeness, and I agree with the above. Sullivan Lone MD MS

## 2018-03-01 LAB — CBC
HCT: 24.2 % — ABNORMAL LOW (ref 39.0–52.0)
Hemoglobin: 7.6 g/dL — ABNORMAL LOW (ref 13.0–17.0)
MCH: 29.5 pg (ref 26.0–34.0)
MCHC: 31.4 g/dL (ref 30.0–36.0)
MCV: 93.8 fL (ref 80.0–100.0)
Platelets: 591 10*3/uL — ABNORMAL HIGH (ref 150–400)
RBC: 2.58 MIL/uL — ABNORMAL LOW (ref 4.22–5.81)
RDW: 15.2 % (ref 11.5–15.5)
WBC: 11.4 10*3/uL — ABNORMAL HIGH (ref 4.0–10.5)
nRBC: 0 % (ref 0.0–0.2)

## 2018-03-01 LAB — BASIC METABOLIC PANEL
BUN: 20 mg/dL (ref 8–23)
Calcium: 8.8 mg/dL — ABNORMAL LOW (ref 8.9–10.3)
Chloride: 105 mmol/L (ref 98–111)
Creatinine, Ser: 0.97 mg/dL (ref 0.61–1.24)
GFR calc Af Amer: >60 mL/min (ref >60)
Sodium: 140 mmol/L (ref 135–145)

## 2018-03-01 LAB — BASIC METABOLIC PANEL WITH GFR
Anion gap: 9 (ref 5–15)
CO2: 26 mmol/L (ref 22–32)
GFR calc non Af Amer: >60 mL/min (ref >60)
Glucose, Bld: 250 mg/dL — ABNORMAL HIGH (ref 70–99)
Potassium: 4 mmol/L (ref 3.5–5.1)

## 2018-03-01 MED ORDER — VINCRISTINE SULFATE CHEMO INJECTION 1 MG/ML
Freq: Once | INTRAVENOUS | Status: AC
  Administered 2018-03-01: 12:00:00 via INTRAVENOUS
  Filled 2018-03-01: qty 13

## 2018-03-01 MED ORDER — SODIUM CHLORIDE 0.9 % IV SOLN
Freq: Once | INTRAVENOUS | Status: AC
  Administered 2018-03-01: 18 mg via INTRAVENOUS
  Filled 2018-03-01: qty 4

## 2018-03-01 NOTE — Progress Notes (Signed)
Ok to tx with hgb 7.4 per Dr Irene Limbo

## 2018-03-01 NOTE — Progress Notes (Signed)
HEMATOLOGY/ONCOLOGY INPATIENT PROGRESS NOTE  Date of Service: 03/01/2018  Inpatient Attending: .Brunetta Genera, MD   SUBJECTIVE:   Jesus [name] reports that he is doing well overall and has been ambulating periodically and has continued eating very well.   The pt reports that he has not had any blood in the stools and has been moving his bowels well, and has had formed stools. He endorses fair energy levels today.   Lab results today (03/01/18) of CBC and BMP is as follows: all values are WNL except for WBC at 11.4k, RBC at 2.58, HGB at 7.6, HCT at 24.2, PLT at 591k, Glucose at 250, Calcium at 8.8.  On review of systems, pt reports fair energy levels, eating well, ambulating periodically, moving his bowels well, formed stools, and denies blood in the stools, black stools, abdominal pains, leg swelling, worsening neuropathy, and any other symptoms.   OBJECTIVE:  NAD  PHYSICAL EXAMINATION: . Vitals:   02/28/18 1341 02/28/18 2138 03/01/18 0627 03/01/18 1513  BP: (!) 136/94 124/81 (!) 157/89 (!) 159/81  Pulse: 79 79 61 77  Resp: 20 16 16 18   Temp: 98 F (36.7 C) 97.8 F (36.6 C) 97.6 F (36.4 C) 98.1 F (36.7 C)  TempSrc: Oral Oral Oral Oral  SpO2: 99% 100% 94% 98%  Weight:      Height:       Filed Weights   02/26/18 0954  Weight: 282 lb 1.6 oz (128 kg)   .Body mass index is 38.26 kg/m.  GENERAL:alert, in no acute distress and comfortable SKIN: no acute rashes, no significant lesions EYES: conjunctiva are pink and non-injected, sclera anicteric OROPHARYNX: MMM, no exudates, no oropharyngeal erythema or ulceration NECK: supple, no JVD LYMPH:  no palpable lymphadenopathy in the cervical, axillary or inguinal regions LUNGS: clear to auscultation b/l with normal respiratory effort HEART: regular rate & rhythm ABDOMEN:  normoactive bowel sounds , non tender, not distended. No palpable hepatosplenomegaly.  Extremity: trace pedal edema PSYCH: alert & oriented x  3 with fluent speech NEURO: no focal motor/sensory deficits   MEDICAL HISTORY:  Past Medical History:  Diagnosis Date  . Allergy   . History of kidney stones   . Hyperlipidemia   . Hypertension   . Lymphadenopathy   . Pain, lower leg    Bilateral  . Peripheral arterial disease ([place])   . Pre-diabetes   . Red-green color blindness   . Snores   . Wears glasses     SURGICAL HISTORY: Past Surgical History:  Procedure Laterality Date  . CATARACT EXTRACTION W/ INTRAOCULAR LENS  IMPLANT, BILATERAL    . COLONOSCOPY    . dislodged salava stone    . FRACTURE SURGERY    . HAND ARTHROPLASTY  1995   crushed left hand  . INGUINAL LYMPH NODE BIOPSY Left 01/02/2018   Procedure: LEFT INGUINAL LYMPH NODE BIOPSY;  Surgeon: Rolm Bookbinder, MD;  Location: [facility];  Service: General;  Laterality: Left;  . IR IMAGING GUIDED PORT INSERTION  01/15/2018  . MICROLARYNGOSCOPY Left 01/17/2014   Procedure: MICROLARYNGOSCOPY WITH EXCISION OF THE BIOPSY OF LEFT VOCAL CORD LESION;  Surgeon: Izora Gala, MD;  Location: [facility];  Service: ENT;  Laterality: Left;  . ORIF FOOT FRACTURE  2005   left    SOCIAL HISTORY: Social History   Socioeconomic History  . Marital status: Divorced    Spouse name: Not on file  . Number of children: 3  . Years of education: Not  on file  . Highest education level: Not on file  Occupational History  . Not on file  Social Needs  . Financial resource strain: Not on file  . Food insecurity:    Worry: Not on file    Inability: Not on file  . Transportation needs:    Medical: Not on file    Non-medical: Not on file  Tobacco Use  . Smoking status: Current Every Day Smoker    Packs/day: 0.50    Years: 36.00    Pack years: 18.00    Types: Cigarettes  . Smokeless tobacco: Never Used  Substance and Sexual Activity  . Alcohol use: Yes    Alcohol/week: 15.0 standard drinks    Types: 10 Cans of beer, 5 Shots of liquor per week    Comment: weekends  .  Drug use: Yes    Types: Cocaine    Comment: reports cocaine usage ~2X/ month; last use 12/26/17  . Sexual activity: Not on file  Lifestyle  . Physical activity:    Days per week: Not on file    Minutes per session: Not on file  . Stress: Not on file  Relationships  . Social connections:    Talks on phone: Not on file    Gets together: Not on file    Attends religious service: Not on file    Active member of club or organization: Not on file    Attends meetings of clubs or organizations: Not on file    Relationship status: Not on file  . Intimate partner violence:    Fear of current or ex partner: Not on file    Emotionally abused: Not on file    Physically abused: Not on file    Forced sexual activity: Not on file  Other Topics Concern  . Not on file  Social History Narrative  . Not on file    FAMILY HISTORY: Family History  Problem Relation Age of Onset  . Breast cancer Mother   . Diabetes Father   . Hypertension Father   . Stroke Father   . Mental illness Sister   . Hypertension Daughter   . Mental illness Daughter   . Hypertension Brother   . Colon cancer Brother   . Breast cancer Sister     ALLERGIES:  is allergic to bee venom.  MEDICATIONS:  Scheduled Meds: . amLODipine  10 mg Oral Daily  . amoxicillin-clavulanate  1 tablet Oral BID  . aspirin EC  81 mg Oral Daily  . DOXOrubicin/vinCRIStine/etoposide CHEMO IV infusion for Inpatient CI   Intravenous Once  . enoxaparin (LOVENOX) injection  40 mg Subcutaneous Q24H  . lisinopril  20 mg Oral Daily  . magnesium oxide  400 mg Oral BID  . predniSONE  60 mg Oral QAC breakfast  . tamsulosin  0.4 mg Oral QPC supper   Continuous Infusions: . sodium chloride 10 mL/hr at 02/28/18 1509   PRN Meds:.acetaminophen, calcium carbonate, heparin lock flush, senna-docusate  REVIEW OF SYSTEMS:    A 10+ POINT REVIEW OF SYSTEMS WAS OBTAINED including neurology, dermatology, psychiatry, cardiac, respiratory, lymph,  extremities, GI, GU, Musculoskeletal, constitutional, breasts, reproductive, HEENT.  All pertinent positives are noted in the HPI.  All others are negative.   LABORATORY DATA:  I have reviewed the data as listed  . CBC Latest Ref Rng & Units 03/01/2018 02/28/2018 02/27/2018  WBC 4.0 - 10.5 K/uL 11.4(H) 17.9(H) 13.6(H)  Hemoglobin 13.0 - 17.0 g/dL 7.6(L) 8.1(L) 8.4(L)  Hematocrit 39.0 -  52.0 % 24.2(L) 25.2(L) 26.5(L)  Platelets 150 - 400 K/uL 591(H) 651(H) 693(H)    . CMP Latest Ref Rng & Units 03/01/2018 02/28/2018 02/27/2018  Glucose 70 - 99 mg/dL 250(H) 321(H) 322(H)  BUN 8 - 23 mg/dL 20 18 14   Creatinine 0.61 - 1.24 mg/dL 0.97 0.90 1.01  Sodium 135 - 145 mmol/L 140 141 137  Potassium 3.5 - 5.1 mmol/L 4.0 4.2 4.3  Chloride 98 - 111 mmol/L 105 107 105  CO2 22 - 32 mmol/L 26 26 25   Calcium 8.9 - 10.3 mg/dL 8.8(L) 8.9 8.9  Total Protein 6.5 - 8.1 g/dL - - -  Total Bilirubin 0.3 - 1.2 mg/dL - - -  Alkaline Phos 38 - 126 U/L - - -  AST 15 - 41 U/L - - -  ALT 0 - 44 U/L - - -     RADIOGRAPHIC STUDIES: I have personally reviewed the radiological images as listed and agreed with the findings in the report. No results found.  ASSESSMENT & PLAN:  62 y.o. 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  CT of the abdomen and pelvis performed on 12/22/2017 showed bulky left inguinal, left hemipelvic, and retroperitoneal adenopathy.   01/02/18 Left inguinal LN Biopsy revealed T-Cell/histocyte rich Large B-Cell Lymphoma  12/27/17 ECHO revealed LV EF of 55-60%   01/05/18 PET/CT revealedMassively enlarged pelvic lymph nodes intense metabolic activity consistent lymphoma. 2. Additional hypermetabolic lymph nodes in the porta hepatis and retroperitoneum LEFT aorta. 3. Solitary  hypermetabolic mediastinal lymph node in the upper LEFT Mediastinum. 4. 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 5. Normal spleen.  HIV non reactive on 12/22/2017.Hep C and hep B serology negative.  2) left lower extremity swelling- now nearly resolved Doppler ultrasound for DVT was negative in the left lower extremity.  Likely from venous compression +/- lymphatic obstruction from bulky left inguinal, left hemipelvic, and retroperitoneal adenopathy.   3) Intermediate to high risk for tumor lysis syndrome.- no TLS noted with allopurinol prophylaxis after C1  4) S/p Port a cath placement   5) E.coli UTI - Pansensitive - presenting with dysuria, frequency , urgency and some incontinence. Also appears to have BPH like symptomatology.  PLAN:  -Discussed pt labwork today, 03/01/18; HGB down to 7.6, WBC improved to 11.4k -Two units PRBCs -The pt has no prohibitive toxicities from continuing C3D4 EPOCH-R at this time.   -Continue eating well, staying hydrated, and staying as active as reasonably possible -Will order PET/CT for completion after C3 -Discussed preventative intrathecal methotrexate as a treatment option for the pt's intermediate risk of CNS recurrence because of his higher risk morphology. Pt not decided about this yet- will decide after PET/CT -outpatient neulasta and Rituxan on 10/28 -Will complete 10 day course of antibiotics for recent UTI    5) DVT prophylaxis -lovenox, SCD, ambulation.  6) HTN -Continue outpatient antihypertensives and monitor  7) hemorrhoidal bleeding- resolved. hgb stable. Prn anusol   The total time spent in the appt was 25 minutes and more than 50% was on counseling and direct patient cares.    Sullivan Lone MD [place] AAHIVMS [facility]  (Office):       [phone] (Work cell):  [phone] (Fax):            [phone]  03/01/2018 4:17 PM   I,  Baldwin [place], am acting as a scribe for Dr. Irene Limbo  .I have reviewed the above documentation for accuracy and completeness, and I agree with the above. Sullivan Lone MD MS

## 2018-03-02 ENCOUNTER — Other Ambulatory Visit: Payer: Self-pay | Admitting: Hematology

## 2018-03-02 DIAGNOSIS — B9620 Unspecified Escherichia coli [E. coli] as the cause of diseases classified elsewhere: Secondary | ICD-10-CM

## 2018-03-02 DIAGNOSIS — N390 Urinary tract infection, site not specified: Secondary | ICD-10-CM

## 2018-03-02 LAB — BASIC METABOLIC PANEL
Anion gap: 8 (ref 5–15)
BUN: 20 mg/dL (ref 8–23)
Chloride: 103 mmol/L (ref 98–111)
Creatinine, Ser: 0.96 mg/dL (ref 0.61–1.24)
Sodium: 138 mmol/L (ref 135–145)

## 2018-03-02 LAB — CBC
HCT: 29.7 % — ABNORMAL LOW (ref 39.0–52.0)
Hemoglobin: 9.7 g/dL — ABNORMAL LOW (ref 13.0–17.0)
MCH: 29.5 pg (ref 26.0–34.0)
MCHC: 32.7 g/dL (ref 30.0–36.0)
MCV: 90.3 fL (ref 80.0–100.0)
Platelets: 583 10*3/uL — ABNORMAL HIGH (ref 150–400)
RBC: 3.29 MIL/uL — ABNORMAL LOW (ref 4.22–5.81)
RDW: 14.5 % (ref 11.5–15.5)
WBC: 5.2 10*3/uL (ref 4.0–10.5)
nRBC: 0 % (ref 0.0–0.2)

## 2018-03-02 LAB — BASIC METABOLIC PANEL WITH GFR
CO2: 27 mmol/L (ref 22–32)
Calcium: 8.9 mg/dL (ref 8.9–10.3)
GFR calc Af Amer: >60 mL/min (ref >60)
GFR calc non Af Amer: >60 mL/min (ref >60)
Glucose, Bld: 237 mg/dL — ABNORMAL HIGH (ref 70–99)
Potassium: 3.6 mmol/L (ref 3.5–5.1)

## 2018-03-02 LAB — PREPARE RBC (CROSSMATCH)

## 2018-03-02 MED ORDER — SODIUM CHLORIDE 0.9 % IV SOLN
750.0000 mg/m2 | Freq: Once | INTRAVENOUS | Status: AC
  Administered 2018-03-02: 1980 mg via INTRAVENOUS
  Filled 2018-03-02: qty 99

## 2018-03-02 MED ORDER — ACETAMINOPHEN 325 MG PO TABS
650.0000 mg | ORAL_TABLET | Freq: Once | ORAL | Status: AC
  Administered 2018-03-02: 650 mg via ORAL
  Filled 2018-03-02: qty 2

## 2018-03-02 MED ORDER — HEPARIN SOD (PORK) LOCK FLUSH 100 UNIT/ML IV SOLN
500.0000 [IU] | Freq: Every day | INTRAVENOUS | Status: DC | PRN
  Filled 2018-03-02: qty 5

## 2018-03-02 MED ORDER — SODIUM CHLORIDE 0.9% FLUSH: 3.0000 mL | INTRAVENOUS | Status: DC | PRN

## 2018-03-02 MED ORDER — FUROSEMIDE 10 MG/ML IJ SOLN
INTRAMUSCULAR | Status: AC
  Filled 2018-03-02: qty 2

## 2018-03-02 MED ORDER — SODIUM CHLORIDE 0.9 % IV SOLN
Freq: Once | INTRAVENOUS | Status: AC
  Administered 2018-03-02: 32 mg via INTRAVENOUS
  Filled 2018-03-02: qty 8

## 2018-03-02 MED ORDER — FUROSEMIDE 10 MG/ML IJ SOLN
20.0000 mg | Freq: Once | INTRAMUSCULAR | Status: AC
  Administered 2018-03-02 (×2): 20 mg via INTRAVENOUS
  Filled 2018-03-02: qty 2

## 2018-03-02 MED ORDER — HEPARIN SOD (PORK) LOCK FLUSH 100 UNIT/ML IV SOLN: 250.0000 [IU] | INTRAVENOUS | Status: DC | PRN

## 2018-03-02 MED ORDER — SODIUM CHLORIDE 0.9% FLUSH: 10.0000 mL | INTRAVENOUS | Status: DC | PRN

## 2018-03-02 MED ORDER — SODIUM CHLORIDE 0.9% IV SOLUTION
250.0000 mL | Freq: Once | INTRAVENOUS | Status: AC
  Administered 2018-03-02: 250 mL via INTRAVENOUS

## 2018-03-02 NOTE — Discharge Summary (Signed)
[place]  [phone] [phone]    Physician Discharge Summary     Patient ID: Jesus [name] MRN: [id number] DOB/AGE: 09-May-1956 62 y.o.  Admit date: 02/26/2018 Discharge date: 03/02/2018  Primary Care Physician:  Shirline Frees, MD   Discharge Diagnoses:    Present on Admission: . Diffuse large B cell lymphoma [facility])   Discharge Medications:  Allergies as of 03/02/2018      Reactions   Bee Venom Anaphylaxis      Medication List    STOP taking these medications   amoxicillin-clavulanate 875-125 MG tablet Commonly known as:  AUGMENTIN     TAKE these medications   acetaminophen 500 MG tablet Commonly known as:  TYLENOL Take 1,000 mg by mouth every 6 (six) hours as needed for mild pain.   amLODipine 10 MG tablet Commonly known as:  NORVASC Take 10 mg by mouth daily.   aspirin 81 MG tablet Take 81 mg by mouth daily.   CIALIS 20 MG tablet Generic drug:  tadalafil Take 20 mg by mouth daily as needed for erectile dysfunction.   dexamethasone 4 MG tablet Commonly known as:  DECADRON Take 2 tablets (8 mg total) by mouth 2 (two) times daily with a meal. Take two times a day starting the day after chemotherapy for 3 days.   hydrocortisone 2.5 % rectal cream Commonly known as:  ANUSOL-HC Place 1 application rectally 2 (two) times daily as needed for hemorrhoids.   lisinopril 20 MG tablet Commonly known as:  PRINIVIL,ZESTRIL Take 20 mg by mouth daily.   LORazepam 0.5 MG tablet Commonly known as:  ATIVAN Take 1 tablet (0.5 mg total) by mouth every 6 (six) hours as needed (Nausea or vomiting).   multivitamin with minerals tablet Take 1 tablet by mouth daily.   mupirocin ointment 2 % Commonly known as:  BACTROBAN Place 1 application into the nose 2 (two) times daily. What changed:    when to take this  reasons to take this   ondansetron 8 MG tablet Commonly known as:  ZOFRAN Take 1 tablet (8 mg  total) by mouth every 8 (eight) hours as needed for nausea or vomiting.   prochlorperazine 10 MG tablet Commonly known as:  COMPAZINE Take 1 tablet (10 mg total) by mouth every 6 (six) hours as needed (Nausea or vomiting).   tamsulosin 0.4 MG Caps capsule Commonly known as:  FLOMAX Take 1 capsule (0.4 mg total) by mouth daily after supper.        Significant Diagnostic Studies:  No results found.  Discharge Laboratory Values: . CBC Latest Ref Rng & Units 03/02/2018 03/01/2018 02/28/2018  WBC 4.0 - 10.5 K/uL 5.2 11.4(H) 17.9(H)  Hemoglobin 13.0 - 17.0 g/dL 9.7(L) 7.6(L) 8.1(L)  Hematocrit 39.0 - 52.0 % 29.7(L) 24.2(L) 25.2(L)  Platelets 150 - 400 K/uL 583(H) 591(H) 651(H)   . CMP Latest Ref Rng & Units 03/02/2018 03/01/2018 02/28/2018  Glucose 70 - 99 mg/dL 237(H) 250(H) 321(H)  BUN 8 - 23 mg/dL 20 20 18   Creatinine 0.61 - 1.24 mg/dL 0.96 0.97 0.90  Sodium 135 - 145 mmol/L 138 140 141  Potassium 3.5 - 5.1 mmol/L 3.6 4.0 4.2  Chloride 98 - 111 mmol/L 103 105 107  CO2 22 - 32 mmol/L 27 26 26   Calcium 8.9 - 10.3 mg/dL 8.9 8.8(L) 8.9  Total Protein 6.5 - 8.1 g/dL - - -  Total Bilirubin 0.3 - 1.2 mg/dL - - -  Alkaline Phos 38 -  126 U/L - - -  AST 15 - 41 U/L - - -  ALT 0 - 44 U/L - - -     Brief H and P: For complete details please refer to admission H and P, but in brief, Jesus [name] is a wonderful 62 y.o. male who has been admitted today for C3 EPOCH-R treatment of his T-cell rich B-cell lymphoma. [name].   The pt reports that he has had some mild nausea. The pt notes that his previous urinary symptoms have resolved after treatment of his UTI and notes that his urination has returned to baseline. The pt also notes that his abdomen has not had any pain.   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  CT of the abdomen and pelvis performed on 12/22/2017 showed bulky left inguinal, left hemipelvic, and retroperitoneal adenopathy.   01/02/18 Left inguinal LN Biopsy revealed T-Cell/histocyte rich Large B-Cell Lymphoma  12/27/17 ECHO revealed LV EF of 55-60%   01/05/18 PET/CT revealedMassively enlarged pelvic lymph nodes intense metabolic activity consistent lymphoma. 2. Additional hypermetabolic lymph nodes in the porta hepatis and retroperitoneum LEFT aorta. 3. Solitary hypermetabolic mediastinal lymph node in the upper LEFT Mediastinum. 4. 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 5. Normal spleen.  HIV non reactive on 12/22/2017.Hep C and hep B serology negative.  2) left lower extremity swelling- now nearly resolved Doppler ultrasound for DVT was negative in the left lower extremity.  Likely from venous compression +/- lymphatic obstruction from bulky left inguinal, left hemipelvic, and retroperitoneal adenopathy.   3) Intermediate to high risk for tumor lysis syndrome.- no TLS noted with allopurinol prophylaxis after C1  4) S/p Port a cath placement   5) E.coli UTI - Pansensitive - presenting with dysuria, frequency , urgency and some incontinence. Also appears to have BPH like symptomatology.  PLAN:  -Discussed pt labwork today, 03/02/18; HGB down to 7.6 -- improved to 9.7 after PRBC transfusion. -The pt has no prohibitive toxicities from continuing C3D4 EPOCH-R at this time.   -Continue eating well, staying hydrated, and staying as active as reasonably possible -Will order PET/CT for completion after C3 -Discussed preventative intrathecal methotrexate as a treatment option for the pt's intermediate risk of CNS recurrence because of his higher risk morphology. Ptnot decided about this yet- will decide after  PET/CT -outpatient neulasta and Rituxan on 10/28 -Will complete 10 day course of antibioticsfor recent UTI    5) DVT prophylaxis -lovenox, SCD, ambulation.  6) HTN -Continue outpatient antihypertensives and monitor  7) hemorrhoidal bleeding- resolved. hgb stable. Prn anusol   Physical Exam at Discharge: BP (!) 143/96   Pulse 65   Temp 97.9 F (36.6 C) (Oral)   Resp 15   Ht 6' (1.829 m)   Wt 282 lb 1.6 oz (128 kg)   SpO2 100%   BMI 38.26 kg/m  . GENERAL:alert, in no acute distress and comfortable SKIN: no acute rashes, no significant lesions EYES: conjunctiva are pink and non-injected, sclera anicteric OROPHARYNX: MMM, no exudates, no oropharyngeal erythema or ulceration NECK: supple, no JVD LYMPH:  no palpable lymphadenopathy in the cervical, axillary or inguinal regions LUNGS: clear to auscultation b/l with normal respiratory effort HEART: regular rate & rhythm ABDOMEN:  normoactive bowel sounds , non tender, not distended. Extremity: no pedal  edema PSYCH: alert & oriented x 3 with fluent speech NEURO: no focal motor/sensory deficits   Hospital Course:  Active Problems:   Diffuse large B cell lymphoma (HCC)   Hypomagnesemia   Anemia   Diet:  Regular diet  Activity:  Infection prevention strategies  Condition at Discharge:   Stable  Signed: Dr. Sullivan Lone MD [place] [phone]  03/02/2018, 9:57 AM   TT spent discharging patient >30 mins

## 2018-03-02 NOTE — Progress Notes (Signed)
FMLA successfully faxed to [place] at [phone]. Mailed copy to patient address on file.

## 2018-03-05 ENCOUNTER — Inpatient Hospital Stay: Payer: 59

## 2018-03-05 VITALS — BP 151/74 | HR 89 | Temp 98.4°F | Resp 20 | Wt 286.8 lb

## 2018-03-05 DIAGNOSIS — Z7689 Persons encountering health services in other specified circumstances: Secondary | ICD-10-CM | POA: Diagnosis not present

## 2018-03-05 DIAGNOSIS — F17210 Nicotine dependence, cigarettes, uncomplicated: Secondary | ICD-10-CM | POA: Diagnosis not present

## 2018-03-05 DIAGNOSIS — R32 Unspecified urinary incontinence: Secondary | ICD-10-CM | POA: Diagnosis not present

## 2018-03-05 DIAGNOSIS — Z23 Encounter for immunization: Secondary | ICD-10-CM | POA: Diagnosis not present

## 2018-03-05 DIAGNOSIS — R590 Localized enlarged lymph nodes: Secondary | ICD-10-CM | POA: Diagnosis not present

## 2018-03-05 DIAGNOSIS — I739 Peripheral vascular disease, unspecified: Secondary | ICD-10-CM | POA: Diagnosis not present

## 2018-03-05 DIAGNOSIS — R600 Localized edema: Secondary | ICD-10-CM | POA: Diagnosis not present

## 2018-03-05 DIAGNOSIS — C8338 Diffuse large B-cell lymphoma, lymph nodes of multiple sites: Secondary | ICD-10-CM

## 2018-03-05 DIAGNOSIS — Z87442 Personal history of urinary calculi: Secondary | ICD-10-CM | POA: Diagnosis not present

## 2018-03-05 DIAGNOSIS — Z7982 Long term (current) use of aspirin: Secondary | ICD-10-CM | POA: Diagnosis not present

## 2018-03-05 DIAGNOSIS — R5383 Other fatigue: Secondary | ICD-10-CM | POA: Diagnosis not present

## 2018-03-05 DIAGNOSIS — Z79899 Other long term (current) drug therapy: Secondary | ICD-10-CM | POA: Diagnosis not present

## 2018-03-05 DIAGNOSIS — I10 Essential (primary) hypertension: Secondary | ICD-10-CM | POA: Diagnosis not present

## 2018-03-05 DIAGNOSIS — N401 Enlarged prostate with lower urinary tract symptoms: Secondary | ICD-10-CM | POA: Diagnosis not present

## 2018-03-05 DIAGNOSIS — E785 Hyperlipidemia, unspecified: Secondary | ICD-10-CM | POA: Diagnosis not present

## 2018-03-05 DIAGNOSIS — Z7189 Other specified counseling: Secondary | ICD-10-CM

## 2018-03-05 DIAGNOSIS — Z5112 Encounter for antineoplastic immunotherapy: Secondary | ICD-10-CM | POA: Diagnosis not present

## 2018-03-05 LAB — TYPE AND SCREEN
ABO/RH(D): A POS
Antibody Screen: NEGATIVE
Unit division: 0
Unit division: 0

## 2018-03-05 LAB — BPAM RBC
Blood Product Expiration Date: 201911222359
Blood Product Expiration Date: 201911232359
ISSUE DATE / TIME: 201910250345
ISSUE DATE / TIME: 201910250638
Unit Type and Rh: 6200
Unit Type and Rh: 6200

## 2018-03-05 MED ORDER — HEPARIN SOD (PORK) LOCK FLUSH 100 UNIT/ML IV SOLN
500.0000 [IU] | Freq: Once | INTRAVENOUS | Status: AC | PRN
  Administered 2018-03-05: 500 [IU]
  Filled 2018-03-05: qty 5

## 2018-03-05 MED ORDER — METHYLPREDNISOLONE SODIUM SUCC 125 MG IJ SOLR
80.0000 mg | Freq: Once | INTRAMUSCULAR | Status: AC
  Administered 2018-03-05: 80 mg via INTRAVENOUS

## 2018-03-05 MED ORDER — PEGFILGRASTIM-CBQV 6 MG/0.6ML ~~LOC~~ SOSY
6.0000 mg | PREFILLED_SYRINGE | Freq: Once | SUBCUTANEOUS | Status: AC
  Administered 2018-03-05: 6 mg via SUBCUTANEOUS

## 2018-03-05 MED ORDER — DIPHENHYDRAMINE HCL 25 MG PO CAPS
50.0000 mg | ORAL_CAPSULE | Freq: Once | ORAL | Status: AC
  Administered 2018-03-05: 50 mg via ORAL

## 2018-03-05 MED ORDER — SODIUM CHLORIDE 0.9% FLUSH
10.0000 mL | INTRAVENOUS | Status: DC | PRN
  Administered 2018-03-05: 10 mL
  Filled 2018-03-05: qty 10

## 2018-03-05 MED ORDER — ACETAMINOPHEN 325 MG PO TABS
ORAL_TABLET | ORAL | Status: AC
  Filled 2018-03-05: qty 2

## 2018-03-05 MED ORDER — INFLUENZA VAC SPLIT QUAD 0.5 ML IM SUSY
0.5000 mL | PREFILLED_SYRINGE | Freq: Once | INTRAMUSCULAR | Status: AC
  Administered 2018-03-05: 0.5 mL via INTRAMUSCULAR

## 2018-03-05 MED ORDER — SODIUM CHLORIDE 0.9 % IV SOLN
Freq: Once | INTRAVENOUS | Status: AC
  Administered 2018-03-05: 09:00:00 via INTRAVENOUS
  Filled 2018-03-05: qty 250

## 2018-03-05 MED ORDER — SODIUM CHLORIDE 0.9 % IV SOLN
375.0000 mg/m2 | Freq: Once | INTRAVENOUS | Status: AC
  Administered 2018-03-05: 1000 mg via INTRAVENOUS
  Filled 2018-03-05: qty 100

## 2018-03-05 MED ORDER — METHYLPREDNISOLONE SODIUM SUCC 125 MG IJ SOLR
INTRAMUSCULAR | Status: AC
  Filled 2018-03-05: qty 2

## 2018-03-05 MED ORDER — INFLUENZA VAC SPLIT QUAD 0.5 ML IM SUSY
PREFILLED_SYRINGE | INTRAMUSCULAR | Status: AC
  Filled 2018-03-05: qty 0.5

## 2018-03-05 MED ORDER — DIPHENHYDRAMINE HCL 25 MG PO CAPS
ORAL_CAPSULE | ORAL | Status: AC
  Filled 2018-03-05: qty 2

## 2018-03-05 MED ORDER — ACETAMINOPHEN 325 MG PO TABS
650.0000 mg | ORAL_TABLET | Freq: Once | ORAL | Status: AC
  Administered 2018-03-05: 650 mg via ORAL

## 2018-03-05 MED ORDER — PEGFILGRASTIM-CBQV 6 MG/0.6ML ~~LOC~~ SOSY
PREFILLED_SYRINGE | SUBCUTANEOUS | Status: AC
  Filled 2018-03-05: qty 0.6

## 2018-03-05 NOTE — Patient Instructions (Signed)
[facility] Discharge Instructions for Patients Receiving Chemotherapy  Today you received the following chemotherapy agents Rituximab (Rituxan).  To help prevent nausea and vomiting after your treatment, we encourage you to take your nausea medication as prescribed.   If you develop nausea and vomiting that is not controlled by your nausea medication, call the clinic.   BELOW ARE SYMPTOMS THAT SHOULD BE REPORTED IMMEDIATELY:  *FEVER GREATER THAN 100.5 F  *CHILLS WITH OR WITHOUT FEVER  NAUSEA AND VOMITING THAT IS NOT CONTROLLED WITH YOUR NAUSEA MEDICATION  *UNUSUAL SHORTNESS OF BREATH  *UNUSUAL BRUISING OR BLEEDING  TENDERNESS IN MOUTH AND THROAT WITH OR WITHOUT PRESENCE OF ULCERS  *URINARY PROBLEMS  *BOWEL PROBLEMS  UNUSUAL RASH Items with * indicate a potential emergency and should be followed up as soon as possible.  Feel free to call the clinic should you have any questions or concerns. The clinic phone number is [phone].  Please show the [place] at check-in to the Emergency Department and triage nurse.

## 2018-03-06 ENCOUNTER — Other Ambulatory Visit: Payer: Self-pay | Admitting: Hematology

## 2018-03-06 DIAGNOSIS — C8338 Diffuse large B-cell lymphoma, lymph nodes of multiple sites: Secondary | ICD-10-CM

## 2018-03-07 ENCOUNTER — Encounter: Payer: Self-pay | Admitting: Hematology

## 2018-03-08 ENCOUNTER — Telehealth: Payer: Self-pay | Admitting: Hematology

## 2018-03-08 NOTE — Telephone Encounter (Signed)
Scheduled appt per 10/31 sch  Message - pt is aware of appt time

## 2018-03-08 NOTE — Telephone Encounter (Signed)
Called pt re appt being moved due to [place] him updated schedule

## 2018-03-09 ENCOUNTER — Inpatient Hospital Stay: Payer: 59 | Attending: Oncology | Admitting: Medical

## 2018-03-09 VITALS — BP 151/81 | HR 100 | Temp 98.2°F | Resp 17 | Ht 72.0 in | Wt 274.7 lb

## 2018-03-09 DIAGNOSIS — R600 Localized edema: Secondary | ICD-10-CM | POA: Insufficient documentation

## 2018-03-09 DIAGNOSIS — R590 Localized enlarged lymph nodes: Secondary | ICD-10-CM | POA: Insufficient documentation

## 2018-03-09 DIAGNOSIS — I10 Essential (primary) hypertension: Secondary | ICD-10-CM | POA: Insufficient documentation

## 2018-03-09 DIAGNOSIS — R350 Frequency of micturition: Secondary | ICD-10-CM | POA: Insufficient documentation

## 2018-03-09 DIAGNOSIS — Z800 Family history of malignant neoplasm of digestive organs: Secondary | ICD-10-CM | POA: Insufficient documentation

## 2018-03-09 DIAGNOSIS — Z7689 Persons encountering health services in other specified circumstances: Secondary | ICD-10-CM | POA: Insufficient documentation

## 2018-03-09 DIAGNOSIS — F17210 Nicotine dependence, cigarettes, uncomplicated: Secondary | ICD-10-CM | POA: Insufficient documentation

## 2018-03-09 DIAGNOSIS — E785 Hyperlipidemia, unspecified: Secondary | ICD-10-CM | POA: Insufficient documentation

## 2018-03-09 DIAGNOSIS — F1410 Cocaine abuse, uncomplicated: Secondary | ICD-10-CM | POA: Insufficient documentation

## 2018-03-09 DIAGNOSIS — Z79899 Other long term (current) drug therapy: Secondary | ICD-10-CM | POA: Insufficient documentation

## 2018-03-09 DIAGNOSIS — Z803 Family history of malignant neoplasm of breast: Secondary | ICD-10-CM | POA: Insufficient documentation

## 2018-03-09 DIAGNOSIS — I739 Peripheral vascular disease, unspecified: Secondary | ICD-10-CM | POA: Insufficient documentation

## 2018-03-09 DIAGNOSIS — Z7982 Long term (current) use of aspirin: Secondary | ICD-10-CM | POA: Insufficient documentation

## 2018-03-09 DIAGNOSIS — G629 Polyneuropathy, unspecified: Secondary | ICD-10-CM | POA: Insufficient documentation

## 2018-03-09 DIAGNOSIS — R7303 Prediabetes: Secondary | ICD-10-CM | POA: Insufficient documentation

## 2018-03-09 DIAGNOSIS — Z7901 Long term (current) use of anticoagulants: Secondary | ICD-10-CM | POA: Insufficient documentation

## 2018-03-09 DIAGNOSIS — R61 Generalized hyperhidrosis: Secondary | ICD-10-CM | POA: Insufficient documentation

## 2018-03-09 DIAGNOSIS — R0683 Snoring: Secondary | ICD-10-CM | POA: Insufficient documentation

## 2018-03-09 DIAGNOSIS — L989 Disorder of the skin and subcutaneous tissue, unspecified: Secondary | ICD-10-CM

## 2018-03-09 DIAGNOSIS — C8338 Diffuse large B-cell lymphoma, lymph nodes of multiple sites: Secondary | ICD-10-CM

## 2018-03-09 DIAGNOSIS — Z5112 Encounter for antineoplastic immunotherapy: Secondary | ICD-10-CM | POA: Insufficient documentation

## 2018-03-09 NOTE — Progress Notes (Signed)
Pt presents with abscess beneath nose on upper lip that is sensitive to touch w/intermittent itching and bleeding.  Pt also has skin tear on R temple where glasses make contact with his skin.  Denies pus or drainage.  Afebrile.  No rash.

## 2018-03-13 ENCOUNTER — Inpatient Hospital Stay: Payer: 59

## 2018-03-13 ENCOUNTER — Inpatient Hospital Stay: Payer: 59 | Admitting: Hematology

## 2018-03-14 ENCOUNTER — Encounter (HOSPITAL_COMMUNITY)
Admission: RE | Admit: 2018-03-14 | Discharge: 2018-03-14 | Disposition: A | Discharge status: Home / Self Care | Payer: 59 | Source: Ambulatory Visit | Attending: Hematology | Admitting: Hematology

## 2018-03-14 ENCOUNTER — Other Ambulatory Visit: Payer: Self-pay | Admitting: Hematology

## 2018-03-14 DIAGNOSIS — C8338 Diffuse large B-cell lymphoma, lymph nodes of multiple sites: Secondary | ICD-10-CM | POA: Diagnosis present

## 2018-03-14 DIAGNOSIS — C8590 Non-Hodgkin lymphoma, unspecified, unspecified site: Secondary | ICD-10-CM | POA: Diagnosis not present

## 2018-03-14 LAB — GLUCOSE, CAPILLARY: Glucose-Capillary: 159 mg/dL — ABNORMAL HIGH (ref 70–99)

## 2018-03-14 IMAGING — CT NM PET TUM IMG RESTAG (PS) SKULL BASE T - THIGH
8 series · 20 of 25 positions shown · non-contrast
Comparison: PET-CT [DATE]

CLINICAL DATA: Subsequent treatment strategy for lymphoma. Status
post 3 cycles of chemotherapy.

EXAM:
NUCLEAR MEDICINE PET SKULL BASE TO THIGH
TECHNIQUE: 13.5 mCi F-18 FDG was injected intravenously. Full-ring PET imaging
was performed from the skull base to thigh after the radiotracer. CT
data was obtained and used for attenuation correction and anatomic
localization.
Fasting blood glucose: 159 mg/dl

[Series 3: pet sk_thigh ac · axial · 5.0mm · 4.07mm/px · z∈[-1506,-550]mm · 4 of 240 slices shown]
[im 1/240]
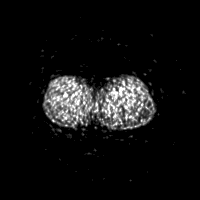
[im 60/240]
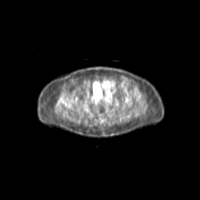
[im 180/240]
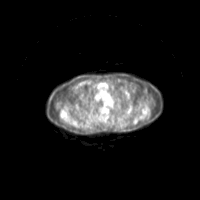
[im 240/240]
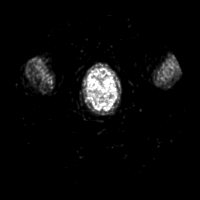

[Series 4: ct sk_thigh 5.0 b31f · axial · 5.0mm · 0.98mm/px · z∈[-1506,-550]mm · 4 of 240 slices shown]
[im 1/240]
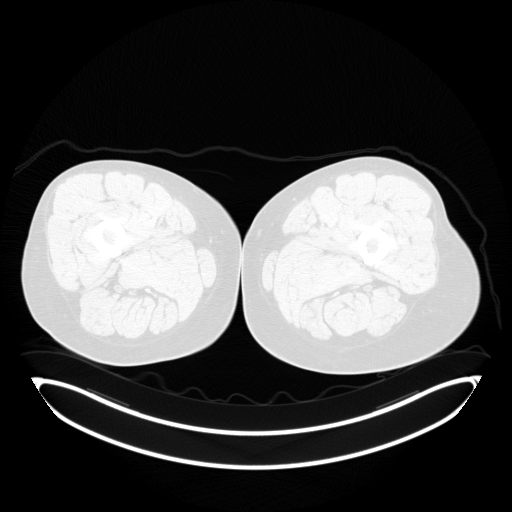
[im 60/240]
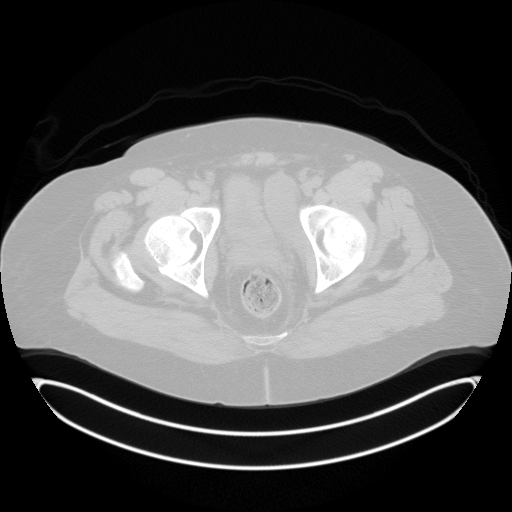
[im 180/240]
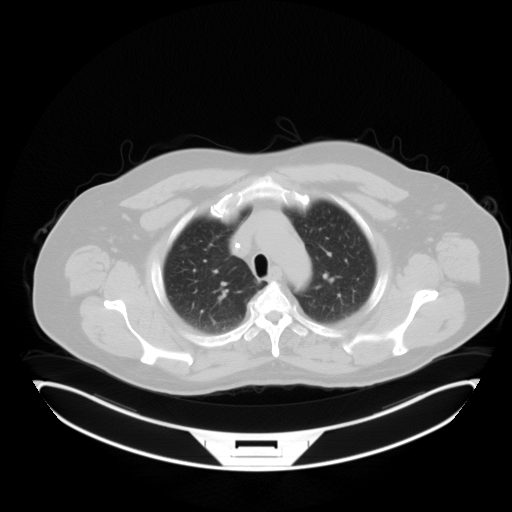
[im 240/240  brain]
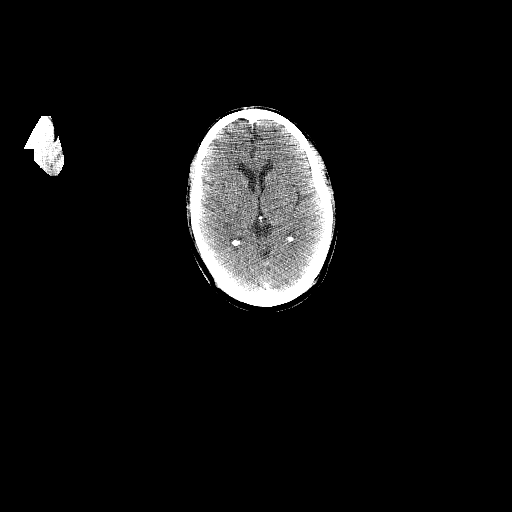

[Series 5: pet sk_thigh nac · axial · 5.0mm · 4.07mm/px · z∈[-1506,-550]mm · 5 of 240 slices shown]
[im 1/240]
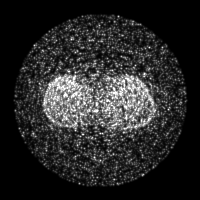
[im 48/240]
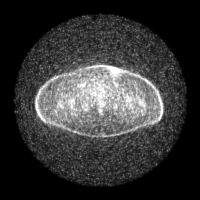
[im 144/240]
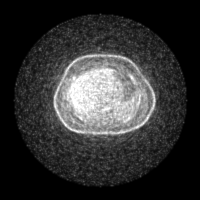
[im 192/240]
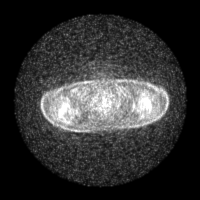
[im 240/240]
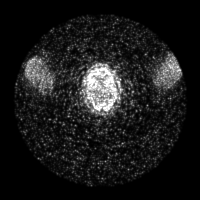

[Series 8: ct sk_thigh 5.0 b70f (id)_bone · axial · 5.0mm · 0.70mm/px · 1 of 70 slices shown]
[im 1/70  bone]
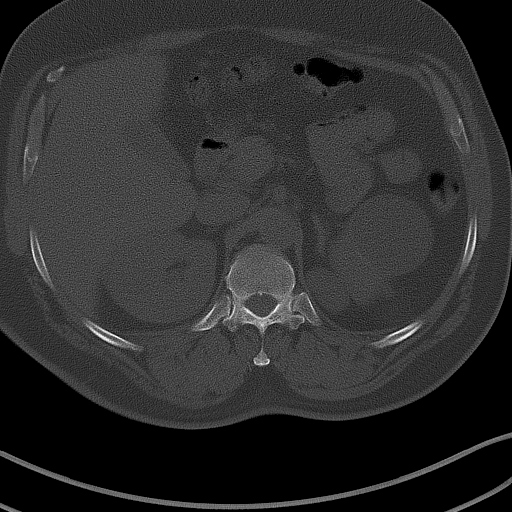

[Series 603: mip range · coronal · 1.98mm/px · 1 of 32 slices shown]
[im 1/32]
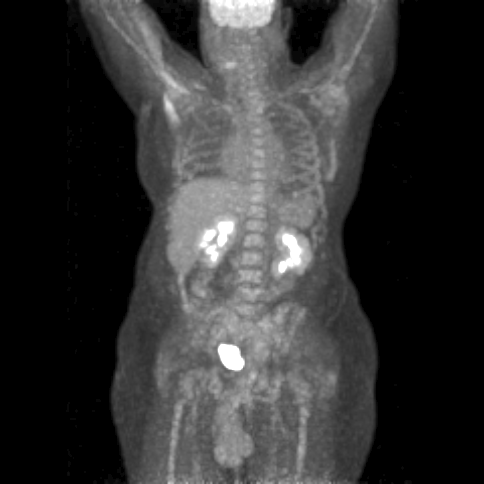

[Series 604: range-ct sk_thigh 5.0 (id)<alpha range> · 1 of 60 slices shown (1 of 2)]
[im 1/60]
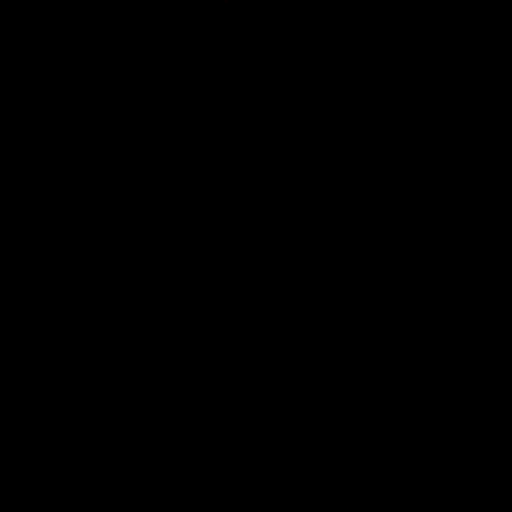

[Series 605: range-ct sk_thigh 5.0 (id)<alpha range> · 3 of 187 slices shown (2 of 2)]
[im 1/187]
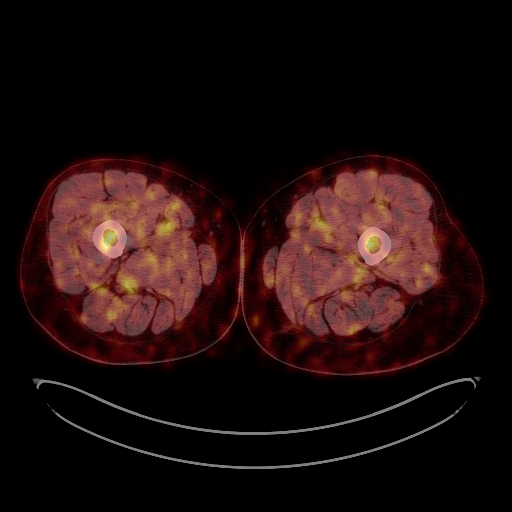
[im 63/187]
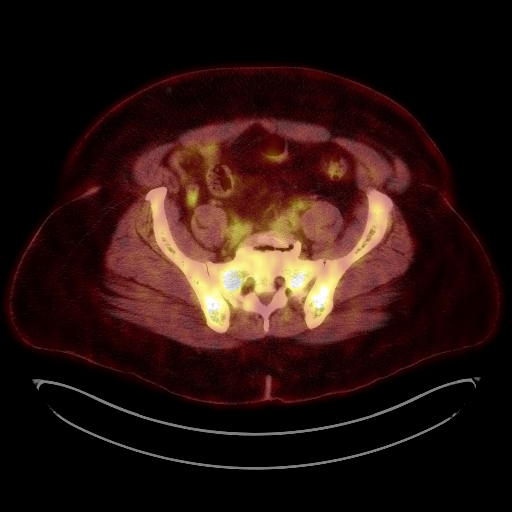
[im 187/187]
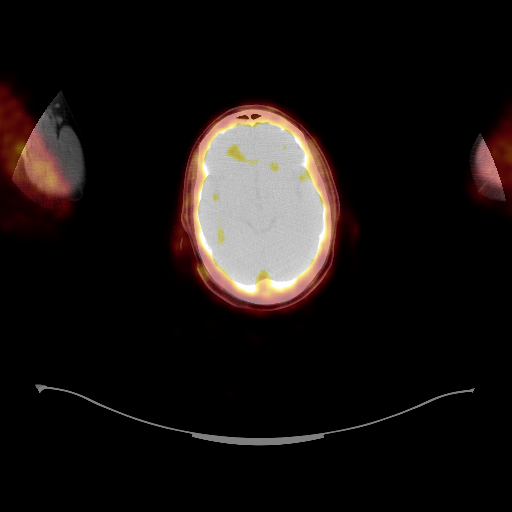

[Series 1189: results mm oncology reading · 5.0mm · 0.51mm/px · 1 of 5 slices shown]
[im 1/5]
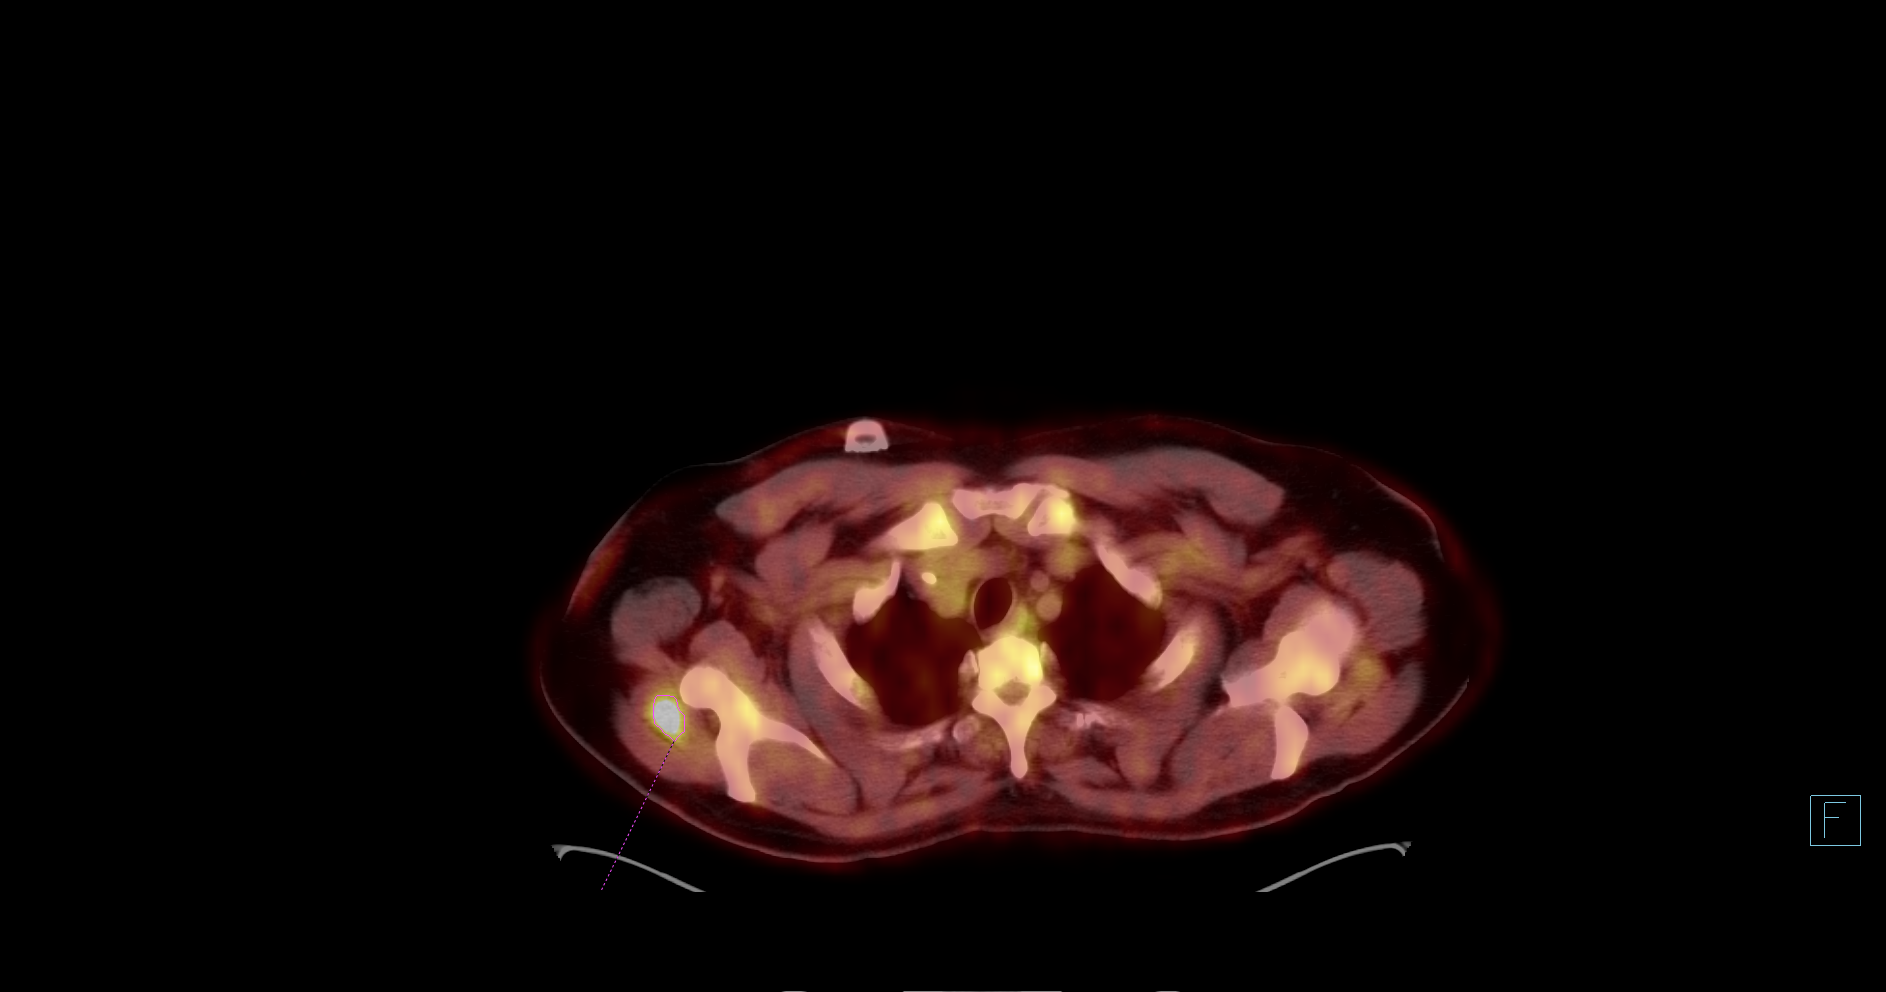

[20 of 25 positions shown; findings below may reference images not displayed]

FINDINGS: Mediastinal blood pool activity: SUV max

NECK: No hypermetabolic lymph nodes in the neck.

Incidental CT findings: Right-sided Port-A-Cath in good position.

CHEST: No supraclavicular, axillary, mediastinal or hilar
lymphadenopathy.

No worrisome pulmonary nodules.

Incidental CT findings: none

ABDOMEN/PELVIS: The hepatic duodenum ligament and periportal
adenopathy has resolved. The 15 mm lymph node seen on the prior
study now measures 6 mm. No residual hypermetabolism.

Near complete resolution of the retroperitoneal adenopathy. Left
para-aortic nodal mass on image number 138 measures 12 mm and
previously measured 28 mm. No residual hypermetabolism.

Left external iliac lymph node on image number 149 measures 10 mm
and previously measured 36 mm. Minimal residual hypermetabolism with
SUV max of 3.2. This was previously 36.9.

Left pelvic nodal mass measures 7.8 x 3.7 cm on image number 178 and
previously measured 12.5 x 7.9 cm. The SUV max is 4.9 and was
previously 36.7.

2.4 cm left inguinal lymph node previously measured 6.2 cm. SUV max
is 4.65 and was previously 38.15.

Incidental CT findings: none

SKELETON: Diffuse metabolic activity throughout the bony structures
is likely due to chemotherapy and or marrow stimulating drugs.
Uptake in the right deltoid muscle may be from an IM injection.

Incidental CT findings: none
IMPRESSION: 1. PET-CT findings suggest an excellent response to chemotherapy.
The abdominal lymphadenopathy has near completely resolved and
demonstrates a near complete metabolic response. The pelvic and
inguinal adenopathy has significantly decreased in size and the
metabolic activity has significantly decreased.
2. Diffuse marrow activity likely due to chemotherapy and or marrow
stimulating drugs. I do not see any discrete persistent lesions.

## 2018-03-14 MED ORDER — FLUDEOXYGLUCOSE F - 18 (FDG) INJECTION
13.5000 | Freq: Once | INTRAVENOUS | Status: AC | PRN
  Administered 2018-03-14: 13.5 via INTRAVENOUS

## 2018-03-15 ENCOUNTER — Encounter: Payer: Self-pay | Admitting: Hematology

## 2018-03-15 NOTE — Progress Notes (Signed)
[facility] Progress Note   Jesus [name] [id number] 1955-08-17 62 y.o.  Jesus [name] is managed by Dr. Sullivan Lone  Actively treated with chemotherapy/immunotherapy: yes  Current Therapy: Rituximab  Last Treated: 03/05/2018 (cycle 3)  Assessment: Plan:    Skin lesion  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[place])   Skin lesion along the upper labial fold right temple: The patient was instructed to restart using Bactroban which he has at home.  Diffuse B-cell lymphoma: The patient continues to be treated by Dr. Sullivan Lone and is status post cycle 3 of rituximab which was dosed on 03/05/2018.  He will be seen in follow-up on 03/26/2018.  Please see After Visit Summary for patient specific instructions.  Future Appointments  Date Time Provider [place]  03/26/2018  9:30 AM [facility] None  03/26/2018 10:00 AM Brunetta Genera, MD [facility] None    No orders of the defined types were placed in this encounter.      Subjective:   Patient ID:  Jesus [name] is a 62 y.o. (DOB Mar 19, 1956) male.  Chief Complaint:  Chief Complaint  Patient presents with  . Recurrent Skin Infections    HPI Jesus [name] is a 62 year old male with a history of a diffuse B-cell lymphoma who is managed by Dr. Sullivan Lone and is status post cycle 3 of rituximab which was dosed on 03/05/2018.  He presents to the clinic today with a report of a skin lesion over the upper labial fold immediately inferior to his nose a skin lesion in his right temple.  He reports that he has had this skin lesion on his upper lip before.  He has Bactroban at home which he is used to treat it in the past with good results.  He is not yet begun using Bactroban this time.  He also reports that he has been picking at the area.  He had a recent sore throat which is now resolved.  Medications: I have reviewed the patient's current  medications.  Allergies:  Allergies  Allergen Reactions  . Bee Venom Anaphylaxis    Past Medical History:  Diagnosis Date  . Allergy   . History of kidney stones   . Hyperlipidemia   . Hypertension   . Lymphadenopathy   . Pain, lower leg    Bilateral  . Peripheral arterial disease ([place])   . Pre-diabetes   . Red-green color blindness   . Snores   . Wears glasses     Past Surgical History:  Procedure Laterality Date  . CATARACT EXTRACTION W/ INTRAOCULAR LENS  IMPLANT, BILATERAL    . COLONOSCOPY    . dislodged salava stone    . FRACTURE SURGERY    . HAND ARTHROPLASTY  1995   crushed left hand  . INGUINAL LYMPH NODE BIOPSY Left 01/02/2018   Procedure: LEFT INGUINAL LYMPH NODE BIOPSY;  Surgeon: Rolm Bookbinder, MD;  Location: [facility];  Service: General;  Laterality: Left;  . IR IMAGING GUIDED PORT INSERTION  01/15/2018  . MICROLARYNGOSCOPY Left 01/17/2014   Procedure: MICROLARYNGOSCOPY WITH EXCISION OF THE BIOPSY OF LEFT VOCAL CORD LESION;  Surgeon: Izora Gala, MD;  Location: [facility];  Service: ENT;  Laterality: Left;  . ORIF FOOT FRACTURE  2005   left    Family History  Problem Relation Age of Onset  . Breast cancer Mother   . Diabetes Father   . Hypertension Father   .  Stroke Father   . Mental illness Sister   . Hypertension Daughter   . Mental illness Daughter   . Hypertension Brother   . Colon cancer Brother   . Breast cancer Sister     Social History   Socioeconomic History  . Marital status: Divorced    Spouse name: Not on file  . Number of children: 3  . Years of education: Not on file  . Highest education level: Not on file  Occupational History  . Not on file  Social Needs  . Financial resource strain: Not on file  . Food insecurity:    Worry: Not on file    Inability: Not on file  . Transportation needs:    Medical: Not on file    Non-medical: Not on file  Tobacco Use  . Smoking status: Current Every Day Smoker     Packs/day: 0.50    Years: 36.00    Pack years: 18.00    Types: Cigarettes  . Smokeless tobacco: Never Used  Substance and Sexual Activity  . Alcohol use: Yes    Alcohol/week: 15.0 standard drinks    Types: 10 Cans of beer, 5 Shots of liquor per week    Comment: weekends  . Drug use: Yes    Types: Cocaine    Comment: reports cocaine usage ~2X/ month; last use 12/26/17  . Sexual activity: Not on file  Lifestyle  . Physical activity:    Days per week: Not on file    Minutes per session: Not on file  . Stress: Not on file  Relationships  . Social connections:    Talks on phone: Not on file    Gets together: Not on file    Attends religious service: Not on file    Active member of club or organization: Not on file    Attends meetings of clubs or organizations: Not on file    Relationship status: Not on file  . Intimate partner violence:    Fear of current or ex partner: Not on file    Emotionally abused: Not on file    Physically abused: Not on file    Forced sexual activity: Not on file  Other Topics Concern  . Not on file  Social History Narrative  . Not on file    Past Medical History, Surgical history, Social history, and Family history were reviewed and updated as appropriate.   Please see review of systems for further details on the patient's review from today.   Review of Systems:  Review of Systems  Constitutional: Negative for chills, diaphoresis and fever.  HENT: Negative for mouth sores and sore throat.   Skin:       Skin lesion of the upper labial fold right temple.    Objective:   Physical Exam:  BP (!) 151/81 (BP Location: Left Arm, Patient Position: Sitting) Comment: Personal assistant is aware  Pulse 100   Temp 98.2 F (36.8 C) (Oral)   Resp 17   Ht 6' (1.829 m)   Wt 274 lb 11.2 oz (124.6 kg)   SpO2 98%   BMI 37.26 kg/m  ECOG: 0  Physical Exam  HENT:  Head: Normocephalic and atraumatic.  Mouth/Throat: Oropharynx is clear and moist. No  oropharyngeal exudate.  0.75 x 0.75 open lesion over the right temple.  A raised warty appearing 4 mm x 4 mm lesion was noted in the upper mid labial fold.  No exudate or bleeding noted.  Neurological: He is alert.  Coordination normal.  Skin: Skin is warm and dry.  Psychiatric: He has a normal mood and affect. His behavior is normal. Judgment and thought content normal.    Lab Review:     Component Value Date/Time   NA 138 03/02/2018 1251   K 3.6 03/02/2018 1251   CL 103 03/02/2018 1251   CO2 27 03/02/2018 1251   GLUCOSE 237 (H) 03/02/2018 1251   BUN 20 03/02/2018 1251   CREATININE 0.96 03/02/2018 1251   CREATININE 1.60 (H) 02/20/2018 0816   CREATININE 1.09 04/29/2014 1930   CALCIUM 8.9 03/02/2018 1251   PROT 6.5 02/26/2018 1056   PROT 6.9 05/30/2016 1651   ALBUMIN 3.3 (L) 02/26/2018 1056   ALBUMIN 4.5 05/30/2016 1651   AST 18 02/26/2018 1056   AST 67 (H) 02/20/2018 0816   ALT 19 02/26/2018 1056   ALT 80 (H) 02/20/2018 0816   ALKPHOS 69 02/26/2018 1056   BILITOT 0.3 02/26/2018 1056   BILITOT 0.5 02/20/2018 0816   GFRNONAA >60 03/02/2018 1251   GFRNONAA 45 (L) 02/20/2018 0816   GFRNONAA 74 04/29/2014 1930   GFRAA >60 03/02/2018 1251   GFRAA 52 (L) 02/20/2018 0816   GFRAA 86 04/29/2014 1930       Component Value Date/Time   WBC 5.2 03/02/2018 1251   RBC 3.29 (L) 03/02/2018 1251   HGB 9.7 (L) 03/02/2018 1251   HCT 29.7 (L) 03/02/2018 1251   PLT 583 (H) 03/02/2018 1251   MCV 90.3 03/02/2018 1251   MCV 87.3 12/22/2017 1518   MCH 29.5 03/02/2018 1251   MCHC 32.7 03/02/2018 1251   RDW 14.5 03/02/2018 1251   LYMPHSABS 0.9 02/26/2018 1056   MONOABS 1.0 02/26/2018 1056   EOSABS 0.0 02/26/2018 1056   BASOSABS 0.1 02/26/2018 1056   -------------------------------  Imaging from last 24 hours (if applicable):  Radiology interpretation: Nm Pet Image Restag (ps) Skull Base To Thigh  Result Date: 03/14/2018 CLINICAL DATA:  Subsequent treatment strategy for lymphoma.  Status post 3 cycles of chemotherapy. EXAM: NUCLEAR MEDICINE PET SKULL BASE TO THIGH TECHNIQUE: 13.5 mCi F-18 FDG was injected intravenously. Full-ring PET imaging was performed from the skull base to thigh after the radiotracer. CT data was obtained and used for attenuation correction and anatomic localization. Fasting blood glucose: 159 mg/dl COMPARISON:  PET-CT 01/05/2018 FINDINGS: Mediastinal blood pool activity: SUV max 3.42 NECK: No hypermetabolic lymph nodes in the neck. Incidental CT findings: Right-sided Port-A-Cath in good position. CHEST: No supraclavicular, axillary, mediastinal or hilar lymphadenopathy. No worrisome pulmonary nodules. Incidental CT findings: none ABDOMEN/PELVIS: The hepatic duodenum ligament and periportal adenopathy has resolved. The 15 mm lymph node seen on the prior study now measures 6 mm. No residual hypermetabolism. Near complete resolution of the retroperitoneal adenopathy. Left para-aortic nodal mass on image number 138 measures 12 mm and previously measured 28 mm. No residual hypermetabolism. Left external iliac lymph node on image number 149 measures 10 mm and previously measured 36 mm. Minimal residual hypermetabolism with SUV max of 3.2. This was previously 36.9. Left pelvic nodal mass measures 7.8 x 3.7 cm on image number 178 and previously measured 12.5 x 7.9 cm. The SUV max is 4.9 and was previously 36.7. 2.4 cm left inguinal lymph node previously measured 6.2 cm. SUV max is 4.65 and was previously 38.15. Incidental CT findings: none SKELETON: Diffuse metabolic activity throughout the bony structures is likely due to chemotherapy and or marrow stimulating drugs. Uptake in the right deltoid muscle may be from an IM  injection. Incidental CT findings: none IMPRESSION: 1. PET-CT findings suggest an excellent response to chemotherapy. The abdominal lymphadenopathy has near completely resolved and demonstrates a near complete metabolic response. The pelvic and inguinal  adenopathy has significantly decreased in size and the metabolic activity has significantly decreased. 2. Diffuse marrow activity likely due to chemotherapy and or marrow stimulating drugs. I do not see any discrete persistent lesions. Electronically Signed   By: Marijo Sanes M.D.   On: 03/14/2018 10:45

## 2018-03-16 ENCOUNTER — Telehealth: Payer: Self-pay | Admitting: *Deleted

## 2018-03-16 NOTE — Telephone Encounter (Signed)
Received refill request thru [employer] for Tamsulosin 0.4 mg, prescribed 10/15 in conjunction with antibiotic for UTI symptoms. Contacted patient. He states symptoms subsided and he still has medicine left, no refill needed. Advised to contact office if symptoms return. Verbalized understanding.

## 2018-03-23 NOTE — Progress Notes (Signed)
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 male is here because of left lower extremity edema and lymphadenopathy.  The patient was seen in the emergency room this past Friday for the same issue.  A CT of the abdomen and pelvis was performed showing bulky left inguinal, left hemipelvic, and retroperitoneal adenopathy.  He was referred to [place] from the emergency room for further evaluation.  Doppler ultrasound of the left lower extremity was performed and was negative for DVT. Patient reports that he has been having left lower extremity edema in his left groin and left leg for approximately 1 month.  He states that the swelling in the left groin started to get better but then worsened.  The left lower extremity edema has slowly worsened over time.  Patient denies having fevers and chills.  He reports that he does have night sweats at times.  He reported [place] approximate 1 [place]  He thinks his headaches are related to not having his blood pressure medication.  His headaches have now resolved.  He denies visual changes.  The patient denies chest pain, shortness of breath and cough.  No nausea, vomiting, constipation, diarrhea.  Denies abdominal pain.  The patient denies recent weight loss and has actually gained weight recently.  Patient denies epistaxis, bleeding gums, hemoptysis, hematuria, but occasionally, and melena.  He reports increased urinary frequency over the past month but no dysuria.  The patient is here for evaluation and discussion of his recent CT and lab findings.  Interval History:   Jesus [name] returns today for management and  evaluation of his newly diagnosed T-Cell/histocyte rich Large B-Cell Lymphoma. I last saw the patient at discharge from [facility] on 03/02/18. [name].   The pt reports that his tingling in his finger tips has remained stable since initially beginning chemotherapy. He notes that the tingling in his forefeet has possibly worsened, and denies any associated pain.   Of note since the patient's last visit, pt has had a PET/CT completed on 03/14/18 with results revealing PET-CT findings suggest an excellent response to chemotherapy. The abdominal lymphadenopathy has near completely resolved and demonstrates a near complete metabolic response. The pelvic and inguinal adenopathy has significantly decreased in size and the metabolic activity has significantly decreased. 2. Diffuse marrow activity likely due to chemotherapy and or marrow stimulating drugs. I do not see any discrete persistent lesions.  Lab results today (03/26/18) of CBC w/diff, CMP is as follows: all values are WNL except for RBC at 3.39, HGB at 9.9, HCT at 31.5, RDW at 16.7, Monocytes abs at 1.2k, Eosinophils abs at 600, Glucose at 120.  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    MEDICAL HISTORY:  Past Medical History:  Diagnosis Date  . Allergy   . History of kidney stones   . Hyperlipidemia   . Hypertension   . Lymphadenopathy   . Pain, lower leg  Bilateral  . Peripheral arterial disease ([place])   . Pre-diabetes   . Red-green color blindness   . Snores   . Wears glasses     SURGICAL HISTORY: Past Surgical History:  Procedure Laterality Date  . CATARACT EXTRACTION W/ INTRAOCULAR LENS  IMPLANT, BILATERAL    . COLONOSCOPY    . dislodged salava stone    . FRACTURE SURGERY    . HAND ARTHROPLASTY   1995   crushed left hand  . INGUINAL LYMPH NODE BIOPSY Left 01/02/2018   Procedure: LEFT INGUINAL LYMPH NODE BIOPSY;  Surgeon: Rolm Bookbinder, MD;  Location: [facility];  Service: General;  Laterality: Left;  . IR IMAGING GUIDED PORT INSERTION  01/15/2018  . MICROLARYNGOSCOPY Left 01/17/2014   Procedure: MICROLARYNGOSCOPY WITH EXCISION OF THE BIOPSY OF LEFT VOCAL CORD LESION;  Surgeon: Izora Gala, MD;  Location: [facility];  Service: ENT;  Laterality: Left;  . ORIF FOOT FRACTURE  2005   left    SOCIAL HISTORY: Social History   Socioeconomic History  . Marital status: Divorced    Spouse name: Not on file  . Number of children: 3  . Years of education: Not on file  . Highest education level: Not on file  Occupational History  . Not on file  Social Needs  . Financial resource strain: Not on file  . Food insecurity:    Worry: Not on file    Inability: Not on file  . Transportation needs:    Medical: Not on file    Non-medical: Not on file  Tobacco Use  . Smoking status: Current Every Day Smoker    Packs/day: 0.50    Years: 36.00    Pack years: 18.00    Types: Cigarettes  . Smokeless tobacco: Never Used  Substance and Sexual Activity  . Alcohol use: Yes    Alcohol/week: 15.0 standard drinks    Types: 10 Cans of beer, 5 Shots of liquor per week    Comment: weekends  . Drug use: Yes    Types: Cocaine    Comment: reports cocaine usage ~2X/ month; last use 12/26/17  . Sexual activity: Not on file  Lifestyle  . Physical activity:    Days per week: Not on file    Minutes per session: Not on file  . Stress: Not on file  Relationships  . Social connections:    Talks on phone: Not on file    Gets together: Not on file    Attends religious service: Not on file    Active member of club or organization: Not on file    Attends meetings of clubs or organizations: Not on file    Relationship status: Not on file  . Intimate partner violence:    Fear of current or ex  partner: Not on file    Emotionally abused: Not on file    Physically abused: Not on file    Forced sexual activity: Not on file  Other Topics Concern  . Not on file  Social History Narrative  . Not on file    FAMILY HISTORY: Family History  Problem Relation Age of Onset  . Breast cancer Mother   . Diabetes Father   . Hypertension Father   . Stroke Father   . Mental illness Sister   . Hypertension Daughter   . Mental illness Daughter   . Hypertension Brother   . Colon cancer Brother   . Breast cancer Sister     ALLERGIES:  is  allergic to bee venom.  MEDICATIONS:  Current Outpatient Medications  Medication Sig Dispense Refill  . acetaminophen (TYLENOL) 500 MG tablet Take 1,000 mg by mouth every 6 (six) hours as needed for mild pain.    Marland Kitchen amLODipine (NORVASC) 10 MG tablet Take 10 mg by mouth daily.  1  . aspirin 81 MG tablet Take 81 mg by mouth daily.    Marland Kitchen CIALIS 20 MG tablet Take 20 mg by mouth daily as needed for erectile dysfunction.   0  . dexamethasone (DECADRON) 4 MG tablet Take 2 tablets (8 mg total) by mouth 2 (two) times daily with a meal. Take two times a day starting the day after chemotherapy for 3 days. (Patient not taking: Reported on 03/09/2018) 30 tablet 1  . hydrocortisone (ANUSOL-HC) 2.5 % rectal cream Place 1 application rectally 2 (two) times daily as needed for hemorrhoids. (Patient not taking: Reported on 03/09/2018) 30 g 0  . lisinopril (PRINIVIL,ZESTRIL) 20 MG tablet Take 20 mg by mouth daily.    Marland Kitchen LORazepam (ATIVAN) 0.5 MG tablet Take 1 tablet (0.5 mg total) by mouth every 6 (six) hours as needed (Nausea or vomiting). (Patient not taking: Reported on 02/05/2018) 30 tablet 0  . Multiple Vitamins-Minerals (MULTIVITAMIN WITH MINERALS) tablet Take 1 tablet by mouth daily.    . mupirocin ointment (BACTROBAN) 2 % Place 1 application into the nose 2 (two) times daily. (Patient not taking: Reported on 03/09/2018) 22 g 0  . ondansetron (ZOFRAN) 8 MG tablet Take 1  tablet (8 mg total) by mouth every 8 (eight) hours as needed for nausea or vomiting. (Patient not taking: Reported on 02/05/2018) 20 tablet 0  . prochlorperazine (COMPAZINE) 10 MG tablet Take 1 tablet (10 mg total) by mouth every 6 (six) hours as needed (Nausea or vomiting). (Patient not taking: Reported on 02/05/2018) 30 tablet 1  . tamsulosin (FLOMAX) 0.4 MG CAPS capsule Take 1 capsule (0.4 mg total) by mouth daily after supper. 30 capsule 1   No current facility-administered medications for this visit.     REVIEW OF SYSTEMS:    A 10+ POINT REVIEW OF SYSTEMS WAS OBTAINED including neurology, dermatology, psychiatry, cardiac, respiratory, lymph, extremities, GI, GU, Musculoskeletal, constitutional, breasts, reproductive, HEENT.  All pertinent positives are noted in the HPI.  All others are negative.   PHYSICAL EXAMINATION: ECOG PERFORMANCE STATUS: 1 - Symptomatic but completely ambulatory  Vitals:   03/26/18 1040  BP: 131/82  Pulse: (!) 106  Resp: 18  Temp: 98.2 F (36.8 C)  SpO2: 99%   Filed Weights   03/26/18 1040  Weight: 273 lb 9.6 oz (124.1 kg)    GENERAL:alert, in no acute distress and comfortable SKIN: no acute rashes, no significant lesions EYES: conjunctiva are pink and non-injected, sclera anicteric OROPHARYNX: MMM, no exudates, no oropharyngeal erythema or ulceration NECK: supple, no JVD LYMPH:  no palpable lymphadenopathy in the cervical, axillary or inguinal regions LUNGS: clear to auscultation b/l with normal respiratory effort HEART: regular rate & rhythm ABDOMEN:  normoactive bowel sounds , non tender, not distended. No palpable hepatosplenomegaly.  Extremity: no pedal edema PSYCH: alert & oriented x 3 with fluent speech NEURO: no focal motor/sensory deficits   LABORATORY DATA:  I have reviewed the data as listed CBC Latest Ref Rng & Units 03/26/2018 03/02/2018 03/01/2018  WBC 4.0 - 10.5 K/uL 10.1 5.2 11.4(H)  Hemoglobin 13.0 - 17.0 g/dL 9.9(L) 9.7(L)  7.6(L)  Hematocrit 39.0 - 52.0 % 31.5(L) 29.7(L) 24.2(L)  Platelets 150 - 400  K/uL 364 583(H) 591(H)    CMP Latest Ref Rng & Units 03/26/2018 03/02/2018 03/01/2018  Glucose 70 - 99 mg/dL 120(H) 237(H) 250(H)  BUN 8 - 23 mg/dL 12 20 20   Creatinine 0.61 - 1.24 mg/dL 1.04 0.96 0.97  Sodium 135 - 145 mmol/L 143 138 140  Potassium 3.5 - 5.1 mmol/L 3.7 3.6 4.0  Chloride 98 - 111 mmol/L 108 103 105  CO2 22 - 32 mmol/L 26 27 26   Calcium 8.9 - 10.3 mg/dL 9.5 8.9 8.8(L)  Total Protein 6.5 - 8.1 g/dL 7.0 - -  Total Bilirubin 0.3 - 1.2 mg/dL 0.5 - -  Alkaline Phos 38 - 126 U/L 80 - -  AST 15 - 41 U/L 19 - -  ALT 0 - 44 U/L 13 - -   827/19 Left Inguinal LN Bx:   12/26/17 Tissue Flow Cytometry:   12/26/17 Inguinal Core biopsy:    RADIOGRAPHIC STUDIES: I have personally reviewed the radiological images as listed and agreed with the findings in the report. Nm Pet Image Restag (ps) Skull Base To Thigh  Result Date: 03/14/2018 CLINICAL DATA:  Subsequent treatment strategy for lymphoma. Status post 3 cycles of chemotherapy. EXAM: NUCLEAR MEDICINE PET SKULL BASE TO THIGH TECHNIQUE: 13.5 mCi F-18 FDG was injected intravenously. Full-ring PET imaging was performed from the skull base to thigh after the radiotracer. CT data was obtained and used for attenuation correction and anatomic localization. Fasting blood glucose: 159 mg/dl COMPARISON:  PET-CT 01/05/2018 FINDINGS: Mediastinal blood pool activity: SUV max 3.42 NECK: No hypermetabolic lymph nodes in the neck. Incidental CT findings: Right-sided Port-A-Cath in good position. CHEST: No supraclavicular, axillary, mediastinal or hilar lymphadenopathy. No worrisome pulmonary nodules. Incidental CT findings: none ABDOMEN/PELVIS: The hepatic duodenum ligament and periportal adenopathy has resolved. The 15 mm lymph node seen on the prior study now measures 6 mm. No residual hypermetabolism. Near complete resolution of the retroperitoneal adenopathy. Left  para-aortic nodal mass on image number 138 measures 12 mm and previously measured 28 mm. No residual hypermetabolism. Left external iliac lymph node on image number 149 measures 10 mm and previously measured 36 mm. Minimal residual hypermetabolism with SUV max of 3.2. This was previously 36.9. Left pelvic nodal mass measures 7.8 x 3.7 cm on image number 178 and previously measured 12.5 x 7.9 cm. The SUV max is 4.9 and was previously 36.7. 2.4 cm left inguinal lymph node previously measured 6.2 cm. SUV max is 4.65 and was previously 38.15. Incidental CT findings: none SKELETON: Diffuse metabolic activity throughout the bony structures is likely due to chemotherapy and or marrow stimulating drugs. Uptake in the right deltoid muscle may be from an IM injection. Incidental CT findings: none IMPRESSION: 1. PET-CT findings suggest an excellent response to chemotherapy. The abdominal lymphadenopathy has near completely resolved and demonstrates a near complete metabolic response. The pelvic and inguinal adenopathy has significantly decreased in size and the metabolic activity has significantly decreased. 2. Diffuse marrow activity likely due to chemotherapy and or marrow stimulating drugs. I do not see any discrete persistent lesions. Electronically Signed   By: Marijo Sanes M.D.   On: 03/14/2018 10:45    ASSESSMENT & PLAN:   This is a pleasant 62 y.o. 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  CT of the abdomen and pelvis performed  on 12/22/2017 showed bulky left inguinal, left hemipelvic, and retroperitoneal adenopathy.   01/02/18 Left inguinal LN Biopsy revealed T-Cell/histocyte rich Large B-Cell Lymphoma  12/27/17 ECHO revealed LV  EF of 55-60%   01/05/18 PET/CT revealedMassively enlarged pelvic lymph nodes intense metabolic activity consistent lymphoma. 2. Additional hypermetabolic lymph nodes in the porta hepatis and retroperitoneum LEFT aorta. 3. Solitary hypermetabolic mediastinal lymph node in the upper LEFT Mediastinum. 4. 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 5. Normal spleen.  HIV non reactive on 12/22/2017.Hep C and hep B serology negative.  2) left lower extremity swelling- now resolved Doppler ultrasound for DVT was negative in the left lower extremity.  Likely from venous compression +/- lymphatic obstruction from bulky left inguinal, left hemipelvic, and retroperitoneal adenopathy.   3) Intermediate to high risk for tumor lysis syndrome.- no TLS noted with allopurinol prophylaxis after C1  4) S/p Port a cath placement   5) s/p recent E.coli UTI - Pansensitive - Resolved. Also appeared to have BPH like symptomatology.  PLAN: -Discussed pt labwork today, 03/26/18; blood counts and chemistries are stable. Uric acid is normal.  -Discussed the 03/14/18 PET/CT which revealed PET-CT findings suggest an excellent response to chemotherapy. The abdominal lymphadenopathy has near completely resolved and demonstrates a near complete metabolic response. The pelvic and inguinal adenopathy has significantly decreased in size and the metabolic activity has significantly decreased. 2. Diffuse marrow activity likely due to chemotherapy and or marrow stimulating drugs. I do not see any discrete persistent lesions.  -Will admit pt for C4 EPOCH-R tomorrow 03/27/18 -Discussed beginning prophylactic intrathecal methotrexate treatment again with the pt, and the associated risks. The pt prefers this.  -Will complete Rituxan and Neulasta as outpatient on 04/02/18   5) DVT prophylaxis -lovenox, SCD, ambulation.  6) HTN -Continue outpatient antihypertensives and  monitor  7) hemorrhoidal bleeding- resolved. hgb stable. Prn anusol    Orders Placed This Encounter  Procedures  . CBC with Differential/Platelet    Standing Status:   Future    Standing Expiration Date:   04/30/2019  . CMP ([facility] only)    Standing Status:   Future    Standing Expiration Date:   03/27/2019  . Lactate dehydrogenase    Standing Status:   Future    Standing Expiration Date:   03/27/2019    [facility] admit tomorrow as inpatient for 5 days -Outpatient Rituxan and Neulasta on 04/02/2018 -RTC with Dr Irene Limbo on 04/13/2018 with labs   All questions were answered. The patient knows to call the clinic with any problems, questions or concerns.  The total time spent in the appt was 25 minutes and more than 50% was on counseling and direct patient cares.    Sullivan Lone MD [place] AAHIVMS [facility] Hematology/Oncology Physician [facility]  (Office):       [phone] (Work cell):  [phone] (Fax):           [phone]  I, Baldwin [place], am acting as a scribe for Dr. Sullivan Lone.   .I have reviewed the above documentation for accuracy and completeness, and I agree with the above. Brunetta Genera MD

## 2018-03-26 ENCOUNTER — Inpatient Hospital Stay: Payer: 59

## 2018-03-26 ENCOUNTER — Telehealth: Payer: Self-pay | Admitting: *Deleted

## 2018-03-26 ENCOUNTER — Inpatient Hospital Stay (HOSPITAL_BASED_OUTPATIENT_CLINIC_OR_DEPARTMENT_OTHER): Payer: 59 | Admitting: Hematology

## 2018-03-26 ENCOUNTER — Telehealth: Payer: Self-pay | Admitting: Hematology

## 2018-03-26 ENCOUNTER — Encounter: Payer: Self-pay | Admitting: Hematology

## 2018-03-26 VITALS — BP 131/82 | HR 106 | Temp 98.2°F | Resp 18 | Ht 72.0 in | Wt 273.6 lb

## 2018-03-26 DIAGNOSIS — Z9841 Cataract extraction status, right eye: Secondary | ICD-10-CM

## 2018-03-26 DIAGNOSIS — Z803 Family history of malignant neoplasm of breast: Secondary | ICD-10-CM

## 2018-03-26 DIAGNOSIS — R0683 Snoring: Secondary | ICD-10-CM

## 2018-03-26 DIAGNOSIS — Z800 Family history of malignant neoplasm of digestive organs: Secondary | ICD-10-CM

## 2018-03-26 DIAGNOSIS — I739 Peripheral vascular disease, unspecified: Secondary | ICD-10-CM

## 2018-03-26 DIAGNOSIS — K649 Unspecified hemorrhoids: Secondary | ICD-10-CM | POA: Diagnosis present

## 2018-03-26 DIAGNOSIS — F17210 Nicotine dependence, cigarettes, uncomplicated: Secondary | ICD-10-CM

## 2018-03-26 DIAGNOSIS — R350 Frequency of micturition: Secondary | ICD-10-CM

## 2018-03-26 DIAGNOSIS — M48061 Spinal stenosis, lumbar region without neurogenic claudication: Secondary | ICD-10-CM | POA: Diagnosis present

## 2018-03-26 DIAGNOSIS — C7951 Secondary malignant neoplasm of bone: Secondary | ICD-10-CM | POA: Diagnosis present

## 2018-03-26 DIAGNOSIS — R590 Localized enlarged lymph nodes: Secondary | ICD-10-CM

## 2018-03-26 DIAGNOSIS — C8338 Diffuse large B-cell lymphoma, lymph nodes of multiple sites: Secondary | ICD-10-CM

## 2018-03-26 DIAGNOSIS — R600 Localized edema: Secondary | ICD-10-CM

## 2018-03-26 DIAGNOSIS — X58XXXA Exposure to other specified factors, initial encounter: Secondary | ICD-10-CM | POA: Diagnosis present

## 2018-03-26 DIAGNOSIS — Z8249 Family history of ischemic heart disease and other diseases of the circulatory system: Secondary | ICD-10-CM

## 2018-03-26 DIAGNOSIS — T451X5A Adverse effect of antineoplastic and immunosuppressive drugs, initial encounter: Secondary | ICD-10-CM | POA: Diagnosis present

## 2018-03-26 DIAGNOSIS — G629 Polyneuropathy, unspecified: Secondary | ICD-10-CM

## 2018-03-26 DIAGNOSIS — Z823 Family history of stroke: Secondary | ICD-10-CM

## 2018-03-26 DIAGNOSIS — G620 Drug-induced polyneuropathy: Secondary | ICD-10-CM

## 2018-03-26 DIAGNOSIS — Z833 Family history of diabetes mellitus: Secondary | ICD-10-CM

## 2018-03-26 DIAGNOSIS — Z818 Family history of other mental and behavioral disorders: Secondary | ICD-10-CM

## 2018-03-26 DIAGNOSIS — L989 Disorder of the skin and subcutaneous tissue, unspecified: Secondary | ICD-10-CM

## 2018-03-26 DIAGNOSIS — Z87442 Personal history of urinary calculi: Secondary | ICD-10-CM

## 2018-03-26 DIAGNOSIS — E785 Hyperlipidemia, unspecified: Secondary | ICD-10-CM

## 2018-03-26 DIAGNOSIS — E876 Hypokalemia: Secondary | ICD-10-CM | POA: Diagnosis present

## 2018-03-26 DIAGNOSIS — I10 Essential (primary) hypertension: Secondary | ICD-10-CM

## 2018-03-26 DIAGNOSIS — Z7982 Long term (current) use of aspirin: Secondary | ICD-10-CM

## 2018-03-26 DIAGNOSIS — R7303 Prediabetes: Secondary | ICD-10-CM

## 2018-03-26 DIAGNOSIS — Z9842 Cataract extraction status, left eye: Secondary | ICD-10-CM

## 2018-03-26 DIAGNOSIS — H5350 Unspecified color vision deficiencies: Secondary | ICD-10-CM | POA: Diagnosis present

## 2018-03-26 DIAGNOSIS — Z79899 Other long term (current) drug therapy: Secondary | ICD-10-CM

## 2018-03-26 DIAGNOSIS — Z91030 Bee allergy status: Secondary | ICD-10-CM

## 2018-03-26 DIAGNOSIS — Z7901 Long term (current) use of anticoagulants: Secondary | ICD-10-CM

## 2018-03-26 DIAGNOSIS — F1410 Cocaine abuse, uncomplicated: Secondary | ICD-10-CM

## 2018-03-26 DIAGNOSIS — R61 Generalized hyperhidrosis: Secondary | ICD-10-CM

## 2018-03-26 DIAGNOSIS — C8330 Diffuse large B-cell lymphoma, unspecified site: Principal | ICD-10-CM | POA: Diagnosis present

## 2018-03-26 DIAGNOSIS — N401 Enlarged prostate with lower urinary tract symptoms: Secondary | ICD-10-CM | POA: Diagnosis present

## 2018-03-26 DIAGNOSIS — Z961 Presence of intraocular lens: Secondary | ICD-10-CM | POA: Diagnosis present

## 2018-03-26 DIAGNOSIS — I871 Compression of vein: Secondary | ICD-10-CM | POA: Diagnosis present

## 2018-03-26 LAB — CMP (CANCER CENTER ONLY)
ALT: 13 U/L (ref 0–44)
AST: 19 U/L (ref 15–41)
Albumin: 3.6 g/dL (ref 3.5–5.0)
Alkaline Phosphatase: 80 U/L (ref 38–126)
Anion gap: 9 (ref 5–15)
BUN: 12 mg/dL (ref 8–23)
CO2: 26 mmol/L (ref 22–32)
Calcium: 9.5 mg/dL (ref 8.9–10.3)
Chloride: 108 mmol/L (ref 98–111)
Creatinine: 1.04 mg/dL (ref 0.61–1.24)
GFR, Est AFR Am: >60 mL/min (ref >60)
GFR, Estimated: >60 mL/min (ref >60)
Glucose, Bld: 120 mg/dL — ABNORMAL HIGH (ref 70–99)
Potassium: 3.7 mmol/L (ref 3.5–5.1)
Sodium: 143 mmol/L (ref 135–145)
Total Bilirubin: 0.5 mg/dL (ref 0.3–1.2)
Total Protein: 7 g/dL (ref 6.5–8.1)

## 2018-03-26 LAB — CBC WITH DIFFERENTIAL/PLATELET
Abs Immature Granulocytes: 0.06 10*3/uL (ref 0.00–0.07)
Basophils Absolute: 0.1 10*3/uL (ref 0.0–0.1)
Basophils Relative: 1 %
Eosinophils Absolute: 0.6 10*3/uL — ABNORMAL HIGH (ref 0.0–0.5)
Eosinophils Relative: 6 %
HCT: 31.5 % — ABNORMAL LOW (ref 39.0–52.0)
Hemoglobin: 9.9 g/dL — ABNORMAL LOW (ref 13.0–17.0)
Immature Granulocytes: 1 %
Lymphocytes Relative: 34 %
Lymphs Abs: 3.4 10*3/uL (ref 0.7–4.0)
MCH: 29.2 pg (ref 26.0–34.0)
MCHC: 31.4 g/dL (ref 30.0–36.0)
MCV: 92.9 fL (ref 80.0–100.0)
Monocytes Absolute: 1.2 10*3/uL — ABNORMAL HIGH (ref 0.1–1.0)
Monocytes Relative: 12 %
Neutro Abs: 4.8 10*3/uL (ref 1.7–7.7)
Neutrophils Relative %: 46 %
Platelets: 364 10*3/uL (ref 150–400)
RBC: 3.39 MIL/uL — ABNORMAL LOW (ref 4.22–5.81)
RDW: 16.7 % — ABNORMAL HIGH (ref 11.5–15.5)
WBC: 10.1 10*3/uL (ref 4.0–10.5)
nRBC: 0 % (ref 0.0–0.2)

## 2018-03-26 LAB — URIC ACID: Uric Acid, Serum: 7.6 mg/dL (ref 3.7–8.6)

## 2018-03-26 LAB — LACTATE DEHYDROGENASE: LDH: 305 U/L — ABNORMAL HIGH (ref 98–192)

## 2018-03-26 NOTE — Telephone Encounter (Signed)
Gave pt avs and calendar  °

## 2018-03-26 NOTE — Telephone Encounter (Signed)
Contacted Dawn in Bed placement. To be admitted to [facility] for 5 days for [facility] on 11/19. Bed placement will contact patient when bed available.

## 2018-03-26 NOTE — Patient Instructions (Signed)
Thank you for choosing [facility] to provide your oncology and hematology care.  To afford each patient quality time with our providers, please arrive 30 minutes before your scheduled appointment time.  If you arrive late for your appointment, you may be asked to reschedule.  We strive to give you quality time with our providers, and arriving late affects you and other patients whose appointments are after yours.    If you are a no show for multiple scheduled visits, you may be dismissed from the clinic at the providers discretion.     Again, thank you for choosing [facility], our hope is that these requests will decrease the amount of time that you wait before being seen by our physicians.  ______________________________________________________________________   Should you have questions after your visit to the [facility], please contact our office at [phone] between the hours of 8:30 and 4:30 p.m.    Voicemails left after 4:30p.m will not be returned until the following business day.     For prescription refill requests, please have your pharmacy contact us directly.  Please also try to allow 48 hours for prescription requests.     Please contact the scheduling department for questions regarding scheduling.  For scheduling of procedures such as PET scans, CT scans, MRI, Ultrasound, etc please contact central scheduling at [phone].     Resources For Cancer Patients and Caregivers:    Oncolink.org:  A wonderful resource for patients and healthcare providers for information regarding your disease, ways to tract your treatment, what to expect, etc.      American Cancer Society:  800-227-2345  Can help patients locate various types of support and financial assistance   Cancer Care: 1-800-813-HOPE (4673) Provides financial assistance, online support groups, medication/co-pay assistance.     [place] DSS:  336-641-3447 Where to apply  for food stamps, Medicaid, and utility assistance   Medicare Rights Center: 800-333-4114 Helps people with Medicare understand their rights and benefits, navigate the Medicare system, and secure the quality healthcare they deserve   [employer]: 336-333-6589  [employer]'s shared-ride transportation service for eligible riders who have a disability that prevents them from riding the fixed route bus.     For additional information on assistance programs please contact our social worker:   Abigail Elmore:  [phone]  

## 2018-03-27 ENCOUNTER — Inpatient Hospital Stay (HOSPITAL_COMMUNITY)
Admission: RE | Admit: 2018-03-27 | Discharge: 2018-03-31 | DRG: 841 | Disposition: A | Discharge status: Home / Self Care | Payer: 59 | Attending: Hematology | Admitting: Hematology

## 2018-03-27 ENCOUNTER — Encounter (HOSPITAL_COMMUNITY): Payer: Self-pay

## 2018-03-27 ENCOUNTER — Other Ambulatory Visit: Payer: Self-pay

## 2018-03-27 DIAGNOSIS — M48061 Spinal stenosis, lumbar region without neurogenic claudication: Secondary | ICD-10-CM | POA: Diagnosis present

## 2018-03-27 DIAGNOSIS — H5350 Unspecified color vision deficiencies: Secondary | ICD-10-CM | POA: Diagnosis present

## 2018-03-27 DIAGNOSIS — C8338 Diffuse large B-cell lymphoma, lymph nodes of multiple sites: Secondary | ICD-10-CM

## 2018-03-27 DIAGNOSIS — Z5111 Encounter for antineoplastic chemotherapy: Secondary | ICD-10-CM

## 2018-03-27 DIAGNOSIS — C8510 Unspecified B-cell lymphoma, unspecified site: Secondary | ICD-10-CM | POA: Diagnosis not present

## 2018-03-27 DIAGNOSIS — Z7189 Other specified counseling: Secondary | ICD-10-CM

## 2018-03-27 DIAGNOSIS — R7303 Prediabetes: Secondary | ICD-10-CM | POA: Diagnosis present

## 2018-03-27 DIAGNOSIS — E876 Hypokalemia: Secondary | ICD-10-CM | POA: Diagnosis present

## 2018-03-27 DIAGNOSIS — Z803 Family history of malignant neoplasm of breast: Secondary | ICD-10-CM | POA: Diagnosis not present

## 2018-03-27 DIAGNOSIS — C8330 Diffuse large B-cell lymphoma, unspecified site: Secondary | ICD-10-CM | POA: Diagnosis present

## 2018-03-27 DIAGNOSIS — Z8249 Family history of ischemic heart disease and other diseases of the circulatory system: Secondary | ICD-10-CM | POA: Diagnosis not present

## 2018-03-27 DIAGNOSIS — Z9841 Cataract extraction status, right eye: Secondary | ICD-10-CM | POA: Diagnosis not present

## 2018-03-27 DIAGNOSIS — X58XXXA Exposure to other specified factors, initial encounter: Secondary | ICD-10-CM | POA: Diagnosis present

## 2018-03-27 DIAGNOSIS — Z833 Family history of diabetes mellitus: Secondary | ICD-10-CM | POA: Diagnosis not present

## 2018-03-27 DIAGNOSIS — Z823 Family history of stroke: Secondary | ICD-10-CM | POA: Diagnosis not present

## 2018-03-27 DIAGNOSIS — Z91030 Bee allergy status: Secondary | ICD-10-CM | POA: Diagnosis not present

## 2018-03-27 DIAGNOSIS — E785 Hyperlipidemia, unspecified: Secondary | ICD-10-CM | POA: Diagnosis present

## 2018-03-27 DIAGNOSIS — Z818 Family history of other mental and behavioral disorders: Secondary | ICD-10-CM | POA: Diagnosis not present

## 2018-03-27 DIAGNOSIS — Z9842 Cataract extraction status, left eye: Secondary | ICD-10-CM | POA: Diagnosis not present

## 2018-03-27 DIAGNOSIS — I739 Peripheral vascular disease, unspecified: Secondary | ICD-10-CM | POA: Diagnosis present

## 2018-03-27 DIAGNOSIS — T451X5A Adverse effect of antineoplastic and immunosuppressive drugs, initial encounter: Secondary | ICD-10-CM | POA: Diagnosis present

## 2018-03-27 DIAGNOSIS — C7951 Secondary malignant neoplasm of bone: Secondary | ICD-10-CM | POA: Diagnosis present

## 2018-03-27 DIAGNOSIS — Z800 Family history of malignant neoplasm of digestive organs: Secondary | ICD-10-CM | POA: Diagnosis not present

## 2018-03-27 DIAGNOSIS — Z961 Presence of intraocular lens: Secondary | ICD-10-CM | POA: Diagnosis present

## 2018-03-27 DIAGNOSIS — Z87442 Personal history of urinary calculi: Secondary | ICD-10-CM | POA: Diagnosis not present

## 2018-03-27 DIAGNOSIS — N401 Enlarged prostate with lower urinary tract symptoms: Secondary | ICD-10-CM | POA: Diagnosis present

## 2018-03-27 DIAGNOSIS — I10 Essential (primary) hypertension: Secondary | ICD-10-CM | POA: Diagnosis present

## 2018-03-27 DIAGNOSIS — F17210 Nicotine dependence, cigarettes, uncomplicated: Secondary | ICD-10-CM | POA: Diagnosis present

## 2018-03-27 DIAGNOSIS — C8580 Other specified types of non-Hodgkin lymphoma, unspecified site: Secondary | ICD-10-CM

## 2018-03-27 DIAGNOSIS — I871 Compression of vein: Secondary | ICD-10-CM | POA: Diagnosis present

## 2018-03-27 LAB — CBC WITH DIFFERENTIAL/PLATELET
Abs Immature Granulocytes: 0.07 10*3/uL (ref 0.00–0.07)
Basophils Absolute: 0.1 10*3/uL (ref 0.0–0.1)
Basophils Relative: 1 %
Eosinophils Absolute: 0.8 10*3/uL — ABNORMAL HIGH (ref 0.0–0.5)
Eosinophils Relative: 5 %
HCT: 29.1 % — ABNORMAL LOW (ref 39.0–52.0)
Hemoglobin: 9.3 g/dL — ABNORMAL LOW (ref 13.0–17.0)
Immature Granulocytes: 1 %
Lymphocytes Relative: 29 %
Lymphs Abs: 4 10*3/uL (ref 0.7–4.0)
MCH: 30.4 pg (ref 26.0–34.0)
MCHC: 32 g/dL (ref 30.0–36.0)
MCV: 95.1 fL (ref 80.0–100.0)
Monocytes Absolute: 1.3 10*3/uL — ABNORMAL HIGH (ref 0.1–1.0)
Monocytes Relative: 9 %
Neutro Abs: 7.8 10*3/uL — ABNORMAL HIGH (ref 1.7–7.7)
Neutrophils Relative %: 55 %
Platelets: 313 10*3/uL (ref 150–400)
RBC: 3.06 MIL/uL — ABNORMAL LOW (ref 4.22–5.81)
RDW: 16.7 % — ABNORMAL HIGH (ref 11.5–15.5)
WBC: 14.1 10*3/uL — ABNORMAL HIGH (ref 4.0–10.5)
nRBC: 0 % (ref 0.0–0.2)

## 2018-03-27 LAB — COMPREHENSIVE METABOLIC PANEL
ALT: 14 U/L (ref 0–44)
AST: 21 U/L (ref 15–41)
Albumin: 3.5 g/dL (ref 3.5–5.0)
Alkaline Phosphatase: 65 U/L (ref 38–126)
Anion gap: 9 (ref 5–15)
BUN: 12 mg/dL (ref 8–23)
CO2: 28 mmol/L (ref 22–32)
Calcium: 8.7 mg/dL — ABNORMAL LOW (ref 8.9–10.3)
Chloride: 105 mmol/L (ref 98–111)
Creatinine, Ser: 0.91 mg/dL (ref 0.61–1.24)
GFR calc Af Amer: >60 mL/min (ref >60)
GFR calc non Af Amer: >60 mL/min (ref >60)
Glucose, Bld: 171 mg/dL — ABNORMAL HIGH (ref 70–99)
Potassium: 3.2 mmol/L — ABNORMAL LOW (ref 3.5–5.1)
Sodium: 142 mmol/L (ref 135–145)
Total Bilirubin: 0.6 mg/dL (ref 0.3–1.2)
Total Protein: 6.5 g/dL (ref 6.5–8.1)

## 2018-03-27 MED ORDER — LISINOPRIL 20 MG PO TABS
20.0000 mg | ORAL_TABLET | Freq: Every day | ORAL | Status: DC
  Administered 2018-03-28 – 2018-03-31 (×4): 20 mg via ORAL
  Filled 2018-03-27 (×4): qty 1

## 2018-03-27 MED ORDER — SODIUM CHLORIDE 0.9 % IV SOLN
INTRAVENOUS | Status: DC
  Administered 2018-03-27: 14:00:00 via INTRAVENOUS

## 2018-03-27 MED ORDER — SODIUM CHLORIDE 0.9 % IV SOLN
Freq: Once | INTRAVENOUS | Status: AC
  Administered 2018-03-27: 18 mg via INTRAVENOUS
  Filled 2018-03-27: qty 4

## 2018-03-27 MED ORDER — TAMSULOSIN HCL 0.4 MG PO CAPS
0.4000 mg | ORAL_CAPSULE | Freq: Every day | ORAL | Status: DC
  Administered 2018-03-27 – 2018-03-30 (×4): 0.4 mg via ORAL
  Filled 2018-03-27 (×4): qty 1

## 2018-03-27 MED ORDER — HYDROCORTISONE 2.5 % RE CREA: 1.0000 "application " | TOPICAL_CREAM | Freq: Two times a day (BID) | RECTAL | Status: DC | PRN

## 2018-03-27 MED ORDER — VINCRISTINE SULFATE CHEMO INJECTION 1 MG/ML
Freq: Once | INTRAVENOUS | Status: AC
  Administered 2018-03-27: 17:00:00 via INTRAVENOUS
  Filled 2018-03-27: qty 13

## 2018-03-27 MED ORDER — ENOXAPARIN SODIUM 40 MG/0.4ML ~~LOC~~ SOLN
40.0000 mg | SUBCUTANEOUS | Status: AC
  Administered 2018-03-27: 40 mg via SUBCUTANEOUS
  Filled 2018-03-27: qty 0.4

## 2018-03-27 MED ORDER — ACETAMINOPHEN 500 MG PO TABS: 1000.0000 mg | ORAL_TABLET | Freq: Four times a day (QID) | ORAL | Status: DC | PRN

## 2018-03-27 MED ORDER — POTASSIUM CHLORIDE CRYS ER 20 MEQ PO TBCR
40.0000 meq | EXTENDED_RELEASE_TABLET | Freq: Two times a day (BID) | ORAL | Status: DC
  Administered 2018-03-27 – 2018-03-28 (×2): 40 meq via ORAL
  Filled 2018-03-27 (×2): qty 2

## 2018-03-27 MED ORDER — AMLODIPINE BESYLATE 10 MG PO TABS
10.0000 mg | ORAL_TABLET | Freq: Every day | ORAL | Status: DC
  Administered 2018-03-28 – 2018-03-31 (×4): 10 mg via ORAL
  Filled 2018-03-27 (×4): qty 1

## 2018-03-27 MED ORDER — PREDNISONE 20 MG PO TABS
60.0000 mg | ORAL_TABLET | Freq: Every day | ORAL | Status: DC
  Administered 2018-03-28 – 2018-03-31 (×4): 60 mg via ORAL
  Filled 2018-03-27 (×4): qty 3

## 2018-03-27 NOTE — H&P (Signed)
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 male who has been admitted today for C4 EPOCH-R treatment of his T-Cell/histocyte rich Large B-Cell Lymphoma. [name].   The pt reports that he has not developed any new concerns. He has continued eating well and is staying hydrated. He has continued moving his bowel well, denies leg swelling and denies any abdominal pains.  Minimal grade 1 tingling/numbness in fingertips likely from vincristine. No other prohibitive toxicities noted.  We discussed and an informed consent was again obtained to proceed with Intrathecal methotrexate on D3 of his current and next cycles of treatment for CNS prophylaxis.  Lab results today (03/28/18) of CBC w/diff, CMP is as follows: all values are WNL except for WBC at 14.1k, RBC at 3.06, HGB at 9.3, RDW at 16.7, ANC at 7.8k, Monocytes abs at 1.3k, Eosinophils abs at 800, Potassium at 3.2, Glucose at 171, Calcium at 8.7.  On review of systems, pt reports eating well, staying hydrated, moving his bowels well and denies leg swelling, worsening neuropathy, abdominal pains, and any other symptoms.    MEDICAL HISTORY:  Past Medical History:  Diagnosis Date  . Allergy   . History of kidney stones   . Hyperlipidemia   . Hypertension   . Lymphadenopathy   . Pain, lower leg    Bilateral  . Peripheral arterial disease ([place])   . Pre-diabetes   . Red-green color blindness   . Snores   . Wears glasses     SURGICAL HISTORY: Past Surgical History:  Procedure Laterality Date  . CATARACT EXTRACTION W/ INTRAOCULAR LENS  IMPLANT, BILATERAL    . COLONOSCOPY    . dislodged salava stone    . FRACTURE  SURGERY    . HAND ARTHROPLASTY  1995   crushed left hand  . INGUINAL LYMPH NODE BIOPSY Left 01/02/2018   Procedure: LEFT INGUINAL LYMPH NODE BIOPSY;  Surgeon: Rolm Bookbinder, MD;  Location: [place];  Service: General;  Laterality: Left;  . IR IMAGING GUIDED PORT INSERTION  01/15/2018  . MICROLARYNGOSCOPY Left 01/17/2014   Procedure: MICROLARYNGOSCOPY WITH EXCISION OF THE BIOPSY OF LEFT VOCAL CORD LESION;  Surgeon: Izora Gala, MD;  Location: [place];  Service: ENT;  Laterality: Left;  . ORIF FOOT FRACTURE  2005   left    SOCIAL HISTORY: Social History   Socioeconomic History  . Marital status: Divorced    Spouse name: Not on file  . Number of children: 3  . Years of education: Not on file  . Highest education level: Not on file  Occupational History  . Not on file  Social Needs  . Financial resource strain: Not on file  . Food insecurity:    Worry: Not on file    Inability: Not on file  . Transportation needs:    Medical: Not on file    Non-medical: Not on file  Tobacco Use  . Smoking status: Current Every Day Smoker    Packs/day: 0.50    Years: 36.00    Pack years: 18.00    Types: Cigarettes  . Smokeless tobacco: Never Used  Substance and Sexual Activity  . Alcohol use: Yes    Alcohol/week: 15.0  standard drinks    Types: 10 Cans of beer, 5 Shots of liquor per week    Comment: weekends  . Drug use: Yes    Types: Cocaine    Comment: reports cocaine usage ~2X/ month; last use 12/26/17  . Sexual activity: Not on file  Lifestyle  . Physical activity:    Days per week: Not on file    Minutes per session: Not on file  . Stress: Not on file  Relationships  . Social connections:    Talks on phone: Not on file    Gets together: Not on file    Attends religious service: Not on file    Active member of club or organization: Not on file    Attends meetings of clubs or organizations: Not on file    Relationship status: Not on file  . Intimate partner  violence:    Fear of current or ex partner: Not on file    Emotionally abused: Not on file    Physically abused: Not on file    Forced sexual activity: Not on file  Other Topics Concern  . Not on file  Social History Narrative  . Not on file    FAMILY HISTORY: Family History  Problem Relation Age of Onset  . Breast cancer Mother   . Diabetes Father   . Hypertension Father   . Stroke Father   . Mental illness Sister   . Hypertension Daughter   . Mental illness Daughter   . Hypertension Brother   . Colon cancer Brother   . Breast cancer Sister     ALLERGIES:  is allergic to bee venom.  MEDICATIONS:  Current Facility-Administered Medications  Medication Dose Route Frequency Provider Last Rate Last Dose  . 0.9 %  sodium chloride infusion   Intravenous Continuous Brunetta Genera, MD 20 mL/hr at 03/27/18 1409    . acetaminophen (TYLENOL) tablet 1,000 mg  1,000 mg Oral Q6H PRN Brunetta Genera, MD      . Derrill Memo ON 03/28/2018] amLODipine (NORVASC) tablet 10 mg  10 mg Oral Daily Brunetta Genera, MD      . DOXOrubicin (ADRIAMYCIN) 26 mg, etoposide (VEPESID) 132 mg, vinCRIStine (ONCOVIN) 1 mg in sodium chloride 0.9 % 1,000 mL chemo infusion   Intravenous Once Brunetta Genera, MD 51 mL/hr at 03/27/18 1649    . enoxaparin (LOVENOX) injection 40 mg  40 mg Subcutaneous Q24H Brunetta Genera, MD      . hydrocortisone (ANUSOL-HC) 2.5 % rectal cream 1 application  1 application Rectal BID PRN Brunetta Genera, MD      . Derrill Memo ON 03/28/2018] lisinopril (PRINIVIL,ZESTRIL) tablet 20 mg  20 mg Oral Daily Brunetta Genera, MD      . Derrill Memo ON 03/28/2018] predniSONE (DELTASONE) tablet 60 mg  60 mg Oral QAC breakfast Brunetta Genera, MD      . tamsulosin (FLOMAX) capsule 0.4 mg  0.4 mg Oral QPC supper Brunetta Genera, MD        REVIEW OF SYSTEMS:    10 Point review of Systems was done is negative except as noted above.  PHYSICAL EXAMINATION: ECOG  PERFORMANCE STATUS: 1 - Symptomatic but completely ambulatory  . Vitals:   03/27/18 1048 03/27/18 1259  BP: 126/78 122/68  Pulse: (!) 109 (!) 104  Resp: 18 16  Temp: 98.7 F (37.1 C) 98.4 F (36.9 C)  SpO2: 98% 98%   Filed Weights   03/27/18 1048  Weight: 266 lb  12.8 oz (121 kg)   .Body mass index is 36.18 kg/m.  GENERAL:alert, in no acute distress and comfortable SKIN: no acute rashes, no significant lesions EYES: conjunctiva are pink and non-injected, sclera anicteric OROPHARYNX: MMM, no exudates, no oropharyngeal erythema or ulceration NECK: supple, no JVD LYMPH:  no palpable lymphadenopathy in the cervical, axillary or inguinal regions LUNGS: clear to auscultation b/l with normal respiratory effort HEART: regular rate & rhythm ABDOMEN:  normoactive bowel sounds , non tender, not distended. Extremity: no pedal edema PSYCH: alert & oriented x 3 with fluent speech NEURO: no focal motor/sensory deficits  LABORATORY DATA:  I have reviewed the data as listed  . CBC Latest Ref Rng & Units 03/27/2018 03/26/2018 03/02/2018  WBC 4.0 - 10.5 K/uL 14.1(H) 10.1 5.2  Hemoglobin 13.0 - 17.0 g/dL 9.3(L) 9.9(L) 9.7(L)  Hematocrit 39.0 - 52.0 % 29.1(L) 31.5(L) 29.7(L)  Platelets 150 - 400 K/uL 313 364 583(H)    . CMP Latest Ref Rng & Units 03/27/2018 03/26/2018 03/02/2018  Glucose 70 - 99 mg/dL 171(H) 120(H) 237(H)  BUN 8 - 23 mg/dL 12 12 20   Creatinine 0.61 - 1.24 mg/dL 0.91 1.04 0.96  Sodium 135 - 145 mmol/L 142 143 138  Potassium 3.5 - 5.1 mmol/L 3.2(L) 3.7 3.6  Chloride 98 - 111 mmol/L 105 108 103  CO2 22 - 32 mmol/L 28 26 27   Calcium 8.9 - 10.3 mg/dL 8.7(L) 9.5 8.9  Total Protein 6.5 - 8.1 g/dL 6.5 7.0 -  Total Bilirubin 0.3 - 1.2 mg/dL 0.6 0.5 -  Alkaline Phos 38 - 126 U/L 65 80 -  AST 15 - 41 U/L 21 19 -  ALT 0 - 44 U/L 14 13 -     RADIOGRAPHIC STUDIES: I have personally reviewed the radiological images as listed and agreed with the findings in the report. Nm  Pet Image Restag (ps) Skull Base To Thigh  Result Date: 03/14/2018 CLINICAL DATA:  Subsequent treatment strategy for lymphoma. Status post 3 cycles of chemotherapy. EXAM: NUCLEAR MEDICINE PET SKULL BASE TO THIGH TECHNIQUE: 13.5 mCi F-18 FDG was injected intravenously. Full-ring PET imaging was performed from the skull base to thigh after the radiotracer. CT data was obtained and used for attenuation correction and anatomic localization. Fasting blood glucose: 159 mg/dl COMPARISON:  PET-CT 01/05/2018 FINDINGS: Mediastinal blood pool activity: SUV max 3.42 NECK: No hypermetabolic lymph nodes in the neck. Incidental CT findings: Right-sided Port-A-Cath in good position. CHEST: No supraclavicular, axillary, mediastinal or hilar lymphadenopathy. No worrisome pulmonary nodules. Incidental CT findings: none ABDOMEN/PELVIS: The hepatic duodenum ligament and periportal adenopathy has resolved. The 15 mm lymph node seen on the prior study now measures 6 mm. No residual hypermetabolism. Near complete resolution of the retroperitoneal adenopathy. Left para-aortic nodal mass on image number 138 measures 12 mm and previously measured 28 mm. No residual hypermetabolism. Left external iliac lymph node on image number 149 measures 10 mm and previously measured 36 mm. Minimal residual hypermetabolism with SUV max of 3.2. This was previously 36.9. Left pelvic nodal mass measures 7.8 x 3.7 cm on image number 178 and previously measured 12.5 x 7.9 cm. The SUV max is 4.9 and was previously 36.7. 2.4 cm left inguinal lymph node previously measured 6.2 cm. SUV max is 4.65 and was previously 38.15. Incidental CT findings: none SKELETON: Diffuse metabolic activity throughout the bony structures is likely due to chemotherapy and or marrow stimulating drugs. Uptake in the right deltoid muscle may be from an IM injection. Incidental CT  findings: none IMPRESSION: 1. PET-CT findings suggest an excellent response to chemotherapy. The abdominal  lymphadenopathy has near completely resolved and demonstrates a near complete metabolic response. The pelvic and inguinal adenopathy has significantly decreased in size and the metabolic activity has significantly decreased. 2. Diffuse marrow activity likely due to chemotherapy and or marrow stimulating drugs. I do not see any discrete persistent lesions. Electronically Signed   By: Marijo Sanes M.D.   On: 03/14/2018 10:45    ASSESSMENT & PLAN:  62 y.o. 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  CT of the abdomen and pelvis performed on 12/22/2017 showed bulky left inguinal, left hemipelvic, and retroperitoneal adenopathy.   01/02/18 Left inguinal LN Biopsy revealed T-Cell/histocyte rich Large B-Cell Lymphoma  12/27/17 ECHO revealed LV EF of 55-60%   01/05/18 PET/CT revealedMassively enlarged pelvic lymph nodes intense metabolic activity consistent lymphoma. 2. Additional hypermetabolic lymph nodes in the porta hepatis and retroperitoneum LEFT aorta. 3. Solitary hypermetabolic mediastinal lymph node in the upper LEFT Mediastinum. 4. 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 5. Normal spleen.  HIV non reactive on 12/22/2017.Hep C and hep B serology negative.  03/14/18 PET/CT revealed PET-CT findings suggest an excellent response to chemotherapy. The abdominal lymphadenopathy has near completely resolved and demonstrates a near complete metabolic response. The pelvic and inguinal adenopathy has significantly decreased in size and the metabolic activity has significantly decreased. 2. Diffuse marrow activity likely due to chemotherapy and or marrow stimulating drugs. I do not see any discrete persistent lesions.   2) left lower extremity  swelling- now resolved Doppler ultrasound for DVT was negative in the left lower extremity.  Likely from venous compression +/- lymphatic obstruction from bulky left inguinal, left hemipelvic, and retroperitoneal adenopathy.   3) Intermediate to high risk for tumor lysis syndrome.- no TLS noted with allopurinol prophylaxis after C1. Off allopurinol.  4) S/p Port a cath placement   5) h/o E.coli UTI - Pansensitive - Resolved. Also appeared to have BPH like symptomatology.  6) Hypokalemia K 3.2 PLAN: -Discussed pt labwork today, 03/28/18; Potassium lower at 3.2, blood counts and chemistries are otherwise stable -replacing potassium po -The pt has no prohibitive toxicities from continuing [place] with G-CSF support at this time.  -chemotherapy orders placed, reviewed and plan discussed with pharmacist. -Will complete Rituxan and Neulasta as outpatient on 04/02/18 as scheduled -Continue eating well, staying hydrated, and staying as active as reasonably possible  -Will hold Lovenox tomorrow in anticipation of IT MTX on Thursday 03/29/18 (ordered IR guided diagnostic and therapeutic Lumbar puncture)     5) DVT prophylaxis -lovenox today then will hold tomorrow for LP, SCD, ambulation.  6) HTN -Continue outpatient antihypertensives and monitor  7) hemorrhoidal bleeding- resolved. hgb stable. Prn anusol   All of the patients questions were answered with apparent satisfaction. The patient knows to call the clinic with any problems, questions or concerns.    Sullivan Lone MD MS AAHIVMS [facility] Hematology/Oncology Physician [facility]  (Office):       [phone] (Work cell):  [phone] (Fax):           [phone]  03/27/2018 5:24 PM  I, Baldwin [place], am acting as a scribe for Dr. Sullivan Lone.   .I have reviewed the above documentation for accuracy and completeness, and I agree with the above. Sullivan Lone  MD MS

## 2018-03-27 NOTE — Progress Notes (Signed)
Dosage and calculations of Etoposide ,Doxorubicin and Vincristin verified by another chemo certified nurse.

## 2018-03-28 DIAGNOSIS — E876 Hypokalemia: Secondary | ICD-10-CM

## 2018-03-28 LAB — CBC
HCT: 27 % — ABNORMAL LOW (ref 39.0–52.0)
Hemoglobin: 8.4 g/dL — ABNORMAL LOW (ref 13.0–17.0)
MCH: 29.3 pg (ref 26.0–34.0)
MCHC: 31.1 g/dL (ref 30.0–36.0)
MCV: 94.1 fL (ref 80.0–100.0)
Platelets: 305 10*3/uL (ref 150–400)
RBC: 2.87 MIL/uL — ABNORMAL LOW (ref 4.22–5.81)
RDW: 16.3 % — ABNORMAL HIGH (ref 11.5–15.5)
WBC: 8.5 10*3/uL (ref 4.0–10.5)
nRBC: 0 % (ref 0.0–0.2)

## 2018-03-28 LAB — BASIC METABOLIC PANEL WITH GFR
BUN: 15 mg/dL (ref 8–23)
CO2: 25 mmol/L (ref 22–32)
GFR calc non Af Amer: >60 mL/min (ref >60)
Potassium: 4.2 mmol/L (ref 3.5–5.1)

## 2018-03-28 LAB — BASIC METABOLIC PANEL
Anion gap: 6 (ref 5–15)
Calcium: 8.3 mg/dL — ABNORMAL LOW (ref 8.9–10.3)
Chloride: 108 mmol/L (ref 98–111)
Creatinine, Ser: 0.86 mg/dL (ref 0.61–1.24)
GFR calc Af Amer: >60 mL/min (ref >60)
Glucose, Bld: 287 mg/dL — ABNORMAL HIGH (ref 70–99)
Sodium: 139 mmol/L (ref 135–145)

## 2018-03-28 MED ORDER — SODIUM CHLORIDE 0.9 % IV SOLN
Freq: Once | INTRAVENOUS | Status: AC
  Administered 2018-03-28: 8 mg via INTRAVENOUS
  Filled 2018-03-28: qty 4

## 2018-03-28 MED ORDER — VINCRISTINE SULFATE CHEMO INJECTION 1 MG/ML
Freq: Once | INTRAVENOUS | Status: AC
  Administered 2018-03-28: 16:00:00 via INTRAVENOUS
  Filled 2018-03-28: qty 13

## 2018-03-28 MED ORDER — POTASSIUM CHLORIDE CRYS ER 20 MEQ PO TBCR
20.0000 meq | EXTENDED_RELEASE_TABLET | Freq: Two times a day (BID) | ORAL | Status: AC
  Administered 2018-03-28 – 2018-03-29 (×2): 20 meq via ORAL
  Filled 2018-03-28 (×2): qty 1

## 2018-03-28 NOTE — Progress Notes (Signed)
HEMATOLOGY/ONCOLOGY INPATIENT PROGRESS NOTE  Date of Service: 03/28/2018  Inpatient Attending: .Brunetta Genera, MD   SUBJECTIVE:   Jesus [name] reports that he is doing well overall and has not developed any new concerns overnight.   The pt reports that he has been eating well and endorses stable energy levels. He denies any abdominal pains, fevers, or chills. He denies any pain or discomfort at the port site.   Lab results today (03/28/18) of CBC and BMP is as follows: all values are WNL except for RBC at 2.87, HGB at 8.4, HCT at 27.0, RDW at 16.3, Glucose at 287, Calcium at 8.7.  On review of systems, pt reports eating well, stable energy levels, moving his bowels well, and denies abdominal pains, leg swelling, CP, SOB, headaches, and any other symptoms.    OBJECTIVE:  NAD  PHYSICAL EXAMINATION: . Vitals:   03/27/18 1259 03/27/18 2154 03/28/18 0626 03/28/18 1423  BP: 122/68 113/63 132/75 (!) 141/73  Pulse: (!) 104 92 89 79  Resp: 16 18 18 16   Temp: 98.4 F (36.9 C) 99.1 F (37.3 C) 98.1 F (36.7 C) 97.9 F (36.6 C)  TempSrc: Oral Oral Oral Oral  SpO2: 98% 96% 99% 93%  Weight:      Height:       Filed Weights   03/27/18 1048  Weight: 266 lb 12.8 oz (121 kg)   .Body mass index is 36.18 kg/m.  GENERAL:alert, in no acute distress and comfortable SKIN: skin color, texture, turgor are normal, no rashes or significant lesions EYES: normal, conjunctiva are pink and non-injected, sclera clear OROPHARYNX:no exudate, no erythema and lips, buccal mucosa, and tongue normal  NECK: supple, no JVD, thyroid normal size, non-tender, without nodularity LYMPH:  no palpable lymphadenopathy in the cervical, axillary or inguinal LUNGS: clear to auscultation with normal respiratory effort HEART: regular rate & rhythm,  no murmurs and no lower extremity edema ABDOMEN: abdomen soft, non-tender, normoactive bowel sounds  Musculoskeletal: no cyanosis of digits and no clubbing   PSYCH: alert & oriented x 3 with fluent speech NEURO: no focal motor/sensory deficits  MEDICAL HISTORY:  Past Medical History:  Diagnosis Date  . Allergy   . History of kidney stones   . Hyperlipidemia   . Hypertension   . Lymphadenopathy   . Pain, lower leg    Bilateral  . Peripheral arterial disease ([place])   . Pre-diabetes   . Red-green color blindness   . Snores   . Wears glasses     SURGICAL HISTORY: Past Surgical History:  Procedure Laterality Date  . CATARACT EXTRACTION W/ INTRAOCULAR LENS  IMPLANT, BILATERAL    . COLONOSCOPY    . dislodged salava stone    . FRACTURE SURGERY    . HAND ARTHROPLASTY  1995   crushed left hand  . INGUINAL LYMPH NODE BIOPSY Left 01/02/2018   Procedure: LEFT INGUINAL LYMPH NODE BIOPSY;  Surgeon: Rolm Bookbinder, MD;  Location: [facility];  Service: General;  Laterality: Left;  . IR IMAGING GUIDED PORT INSERTION  01/15/2018  . MICROLARYNGOSCOPY Left 01/17/2014   Procedure: MICROLARYNGOSCOPY WITH EXCISION OF THE BIOPSY OF LEFT VOCAL CORD LESION;  Surgeon: Izora Gala, MD;  Location: [facility];  Service: ENT;  Laterality: Left;  . ORIF FOOT FRACTURE  2005   left    SOCIAL HISTORY: Social History   Socioeconomic History  . Marital status: Divorced    Spouse name: Not on file  . Number of children: 3  .  Years of education: Not on file  . Highest education level: Not on file  Occupational History  . Not on file  Social Needs  . Financial resource strain: Not on file  . Food insecurity:    Worry: Not on file    Inability: Not on file  . Transportation needs:    Medical: Not on file    Non-medical: Not on file  Tobacco Use  . Smoking status: Current Every Day Smoker    Packs/day: 0.50    Years: 36.00    Pack years: 18.00    Types: Cigarettes  . Smokeless tobacco: Never Used  Substance and Sexual Activity  . Alcohol use: Yes    Alcohol/week: 15.0 standard drinks    Types: 10 Cans of beer, 5 Shots of liquor per week     Comment: weekends  . Drug use: Yes    Types: Cocaine    Comment: reports cocaine usage ~2X/ month; last use 12/26/17  . Sexual activity: Not on file  Lifestyle  . Physical activity:    Days per week: Not on file    Minutes per session: Not on file  . Stress: Not on file  Relationships  . Social connections:    Talks on phone: Not on file    Gets together: Not on file    Attends religious service: Not on file    Active member of club or organization: Not on file    Attends meetings of clubs or organizations: Not on file    Relationship status: Not on file  . Intimate partner violence:    Fear of current or ex partner: Not on file    Emotionally abused: Not on file    Physically abused: Not on file    Forced sexual activity: Not on file  Other Topics Concern  . Not on file  Social History Narrative  . Not on file    FAMILY HISTORY: Family History  Problem Relation Age of Onset  . Breast cancer Mother   . Diabetes Father   . Hypertension Father   . Stroke Father   . Mental illness Sister   . Hypertension Daughter   . Mental illness Daughter   . Hypertension Brother   . Colon cancer Brother   . Breast cancer Sister     ALLERGIES:  is allergic to bee venom.  MEDICATIONS:  Scheduled Meds: . amLODipine  10 mg Oral Daily  . DOXOrubicin/vinCRIStine/etoposide CHEMO IV infusion for Inpatient CI   Intravenous Once  . lisinopril  20 mg Oral Daily  . potassium chloride  20 mEq Oral BID  . predniSONE  60 mg Oral QAC breakfast  . tamsulosin  0.4 mg Oral QPC supper   Continuous Infusions: . sodium chloride 20 mL/hr at 03/28/18 0600   PRN Meds:.acetaminophen, hydrocortisone  REVIEW OF SYSTEMS:    A 10+ POINT REVIEW OF SYSTEMS WAS OBTAINED including neurology, dermatology, psychiatry, cardiac, respiratory, lymph, extremities, GI, GU, Musculoskeletal, constitutional, breasts, reproductive, HEENT.  All pertinent positives are noted in the HPI.  All others are negative.     LABORATORY DATA:  I have reviewed the data as listed  . CBC Latest Ref Rng & Units 03/28/2018 03/27/2018 03/26/2018  WBC 4.0 - 10.5 K/uL 8.5 14.1(H) 10.1  Hemoglobin 13.0 - 17.0 g/dL 8.4(L) 9.3(L) 9.9(L)  Hematocrit 39.0 - 52.0 % 27.0(L) 29.1(L) 31.5(L)  Platelets 150 - 400 K/uL 305 313 364    . CMP Latest Ref Rng & Units 03/28/2018 03/27/2018 03/26/2018  Glucose 70 - 99 mg/dL 287(H) 171(H) 120(H)  BUN 8 - 23 mg/dL 15 12 12   Creatinine 0.61 - 1.24 mg/dL 0.86 0.91 1.04  Sodium 135 - 145 mmol/L 139 142 143  Potassium 3.5 - 5.1 mmol/L 4.2 3.2(L) 3.7  Chloride 98 - 111 mmol/L 108 105 108  CO2 22 - 32 mmol/L 25 28 26   Calcium 8.9 - 10.3 mg/dL 8.3(L) 8.7(L) 9.5  Total Protein 6.5 - 8.1 g/dL - 6.5 7.0  Total Bilirubin 0.3 - 1.2 mg/dL - 0.6 0.5  Alkaline Phos 38 - 126 U/L - 65 80  AST 15 - 41 U/L - 21 19  ALT 0 - 44 U/L - 14 13     RADIOGRAPHIC STUDIES: I have personally reviewed the radiological images as listed and agreed with the findings in the report. Nm Pet Image Restag (ps) Skull Base To Thigh  Result Date: 03/14/2018 CLINICAL DATA:  Subsequent treatment strategy for lymphoma. Status post 3 cycles of chemotherapy. EXAM: NUCLEAR MEDICINE PET SKULL BASE TO THIGH TECHNIQUE: 13.5 mCi F-18 FDG was injected intravenously. Full-ring PET imaging was performed from the skull base to thigh after the radiotracer. CT data was obtained and used for attenuation correction and anatomic localization. Fasting blood glucose: 159 mg/dl COMPARISON:  PET-CT 01/05/2018 FINDINGS: Mediastinal blood pool activity: SUV max 3.42 NECK: No hypermetabolic lymph nodes in the neck. Incidental CT findings: Right-sided Port-A-Cath in good position. CHEST: No supraclavicular, axillary, mediastinal or hilar lymphadenopathy. No worrisome pulmonary nodules. Incidental CT findings: none ABDOMEN/PELVIS: The hepatic duodenum ligament and periportal adenopathy has resolved. The 15 mm lymph node seen on the prior study  now measures 6 mm. No residual hypermetabolism. Near complete resolution of the retroperitoneal adenopathy. Left para-aortic nodal mass on image number 138 measures 12 mm and previously measured 28 mm. No residual hypermetabolism. Left external iliac lymph node on image number 149 measures 10 mm and previously measured 36 mm. Minimal residual hypermetabolism with SUV max of 3.2. This was previously 36.9. Left pelvic nodal mass measures 7.8 x 3.7 cm on image number 178 and previously measured 12.5 x 7.9 cm. The SUV max is 4.9 and was previously 36.7. 2.4 cm left inguinal lymph node previously measured 6.2 cm. SUV max is 4.65 and was previously 38.15. Incidental CT findings: none SKELETON: Diffuse metabolic activity throughout the bony structures is likely due to chemotherapy and or marrow stimulating drugs. Uptake in the right deltoid muscle may be from an IM injection. Incidental CT findings: none IMPRESSION: 1. PET-CT findings suggest an excellent response to chemotherapy. The abdominal lymphadenopathy has near completely resolved and demonstrates a near complete metabolic response. The pelvic and inguinal adenopathy has significantly decreased in size and the metabolic activity has significantly decreased. 2. Diffuse marrow activity likely due to chemotherapy and or marrow stimulating drugs. I do not see any discrete persistent lesions. Electronically Signed   By: Marijo Sanes M.D.   On: 03/14/2018 10:45    ASSESSMENT & PLAN:  62 y.o. 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  CT of the abdomen and pelvis performed on 12/22/2017 showed bulky left inguinal, left hemipelvic, and retroperitoneal adenopathy.   01/02/18 Left inguinal LN Biopsy revealed T-Cell/histocyte rich Large B-Cell  Lymphoma  12/27/17 ECHO revealed LV EF of 55-60%   01/05/18 PET/CT revealedMassively enlarged pelvic lymph nodes intense metabolic activity consistent lymphoma. 2. Additional hypermetabolic  lymph nodes in the porta hepatis and retroperitoneum LEFT aorta. 3. Solitary hypermetabolic mediastinal lymph node in the upper LEFT Mediastinum. 4. 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 5. Normal spleen.  HIV non reactive on 12/22/2017.Hep C and hep B serology negative.  03/14/18 PET/CT revealedPET-CT findings suggest an excellent response to chemotherapy. The abdominal lymphadenopathy has near completely resolved and demonstrates a near complete metabolic response. The pelvic and inguinal adenopathy has significantly decreased in size and the metabolic activity has significantly decreased. 2. Diffuse marrow activity likely due to chemotherapy and or marrow stimulating drugs. I do not see any discrete persistent lesions.   2) left lower extremity swelling- now resolved Doppler ultrasound for DVT was negative in the left lower extremity.  Likely from venous compression +/- lymphatic obstruction from bulky left inguinal, left hemipelvic, and retroperitoneal adenopathy.   3) Intermediate to high risk for tumor lysis syndrome.- no TLS noted with allopurinol prophylaxis after C1. Off allopurinol.  4) S/p Port a cath placement   5) h/o E.coli UTI - Pansensitive -Resolved.Also appearedto have BPH like symptomatology.  6) Hypokalemia K 3.2  PLAN: -Discussed pt labwork today, 03/28/18; Potassium normalized to 4.2, blood counts and chemistries are otherwise stable -continue KCL 6mq po BID -The pt has no prohibitive toxicities from continuing C4D2 EPOCH-R at this time.   -Continue eating well, staying hydrated, and staying as active as reasonably possible  -Will complete Rituxan and Neulasta as outpatient on 04/02/18 as scheduled -Will hold  Lovenox today 03/28/18 in anticipation of IT MTX on Thursday 03/29/18 (ordered IR guided diagnostic and therapeutic Lumbar puncture)    5) DVT prophylaxis -lovenox today then will hold tomorrow for LP, SCD, ambulation.  6) HTN -Continue outpatient antihypertensives and monitor  7) hemorrhoidal bleeding- resolved. hgb stable. Prn anusol  The total time spent in the appt was 25 minutes and more than 50% was on counseling and direct patient cares.     GSullivan LoneMD MS AAHIVMS SSwedish [facility] - Issaqu[facility] CampusCTexas Health Se[facility] Center PlanoHematology/Oncology Physician CCalifornia [facility] (Office):       [phone](Work cell):  [phone](Fax):           [phone] 03/28/2018 4:49 PM   I, SBaldwin Jamaica am acting as a scribe for Dr. GSullivan Lone   .I have reviewed the above documentation for accuracy and completeness, and I agree with the above. GSullivan LoneMD MS

## 2018-03-29 ENCOUNTER — Inpatient Hospital Stay (HOSPITAL_COMMUNITY): Payer: 59

## 2018-03-29 LAB — CBC
HCT: 27.8 % — ABNORMAL LOW (ref 39.0–52.0)
Hemoglobin: 8.6 g/dL — ABNORMAL LOW (ref 13.0–17.0)
MCH: 29.7 pg (ref 26.0–34.0)
MCHC: 30.9 g/dL (ref 30.0–36.0)
MCV: 95.9 fL (ref 80.0–100.0)
Platelets: 283 10*3/uL (ref 150–400)
RBC: 2.9 MIL/uL — ABNORMAL LOW (ref 4.22–5.81)
RDW: 16.2 % — ABNORMAL HIGH (ref 11.5–15.5)
WBC: 10 10*3/uL (ref 4.0–10.5)
nRBC: 0 % (ref 0.0–0.2)

## 2018-03-29 LAB — BASIC METABOLIC PANEL WITH GFR
Anion gap: 7 (ref 5–15)
Calcium: 9.3 mg/dL (ref 8.9–10.3)
GFR calc Af Amer: >60 mL/min (ref >60)
Potassium: 4.1 mmol/L (ref 3.5–5.1)
Sodium: 139 mmol/L (ref 135–145)

## 2018-03-29 LAB — BASIC METABOLIC PANEL
BUN: 13 mg/dL (ref 8–23)
CO2: 25 mmol/L (ref 22–32)
Chloride: 107 mmol/L (ref 98–111)
Creatinine, Ser: 0.73 mg/dL (ref 0.61–1.24)
GFR calc non Af Amer: >60 mL/min (ref >60)
Glucose, Bld: 231 mg/dL — ABNORMAL HIGH (ref 70–99)

## 2018-03-29 IMAGING — RF DG FLUORO GUIDE LUMBAR PUNCTURE
7 series · 11 of 11 positions shown · non-contrast
Comparison: none

CLINICAL DATA: B-cell lymphoma, diagnostic lumbar puncture and
intrathecal methotrexate administration

[Series 1: cp_standard · 0.17mm/px · 1 of 1 slices shown (1 of 7)]
[im 1/1]
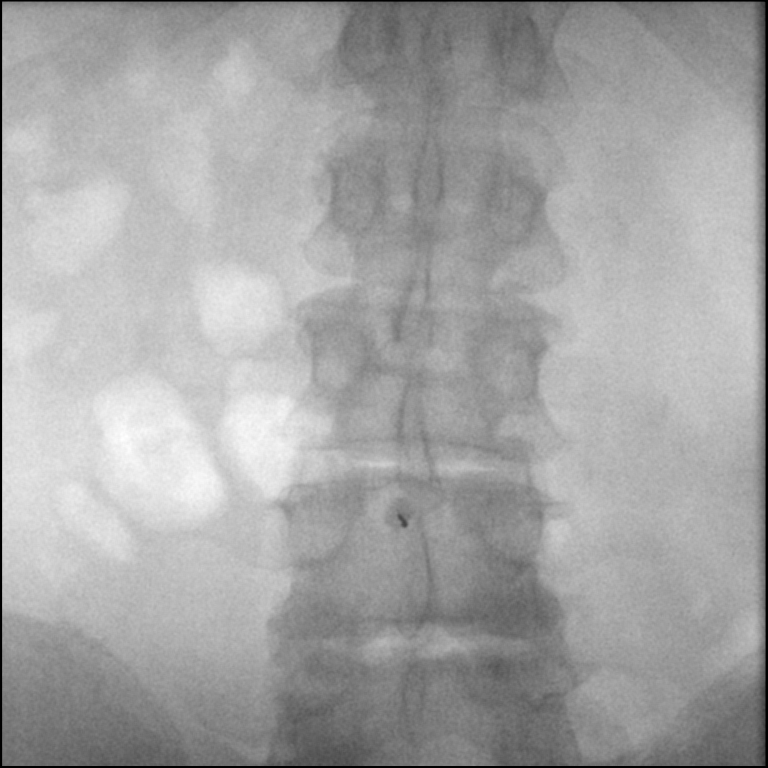

[Series 2: cp_standard · 0.17mm/px · 1 of 1 slices shown (2 of 7)]
[im 1/1]
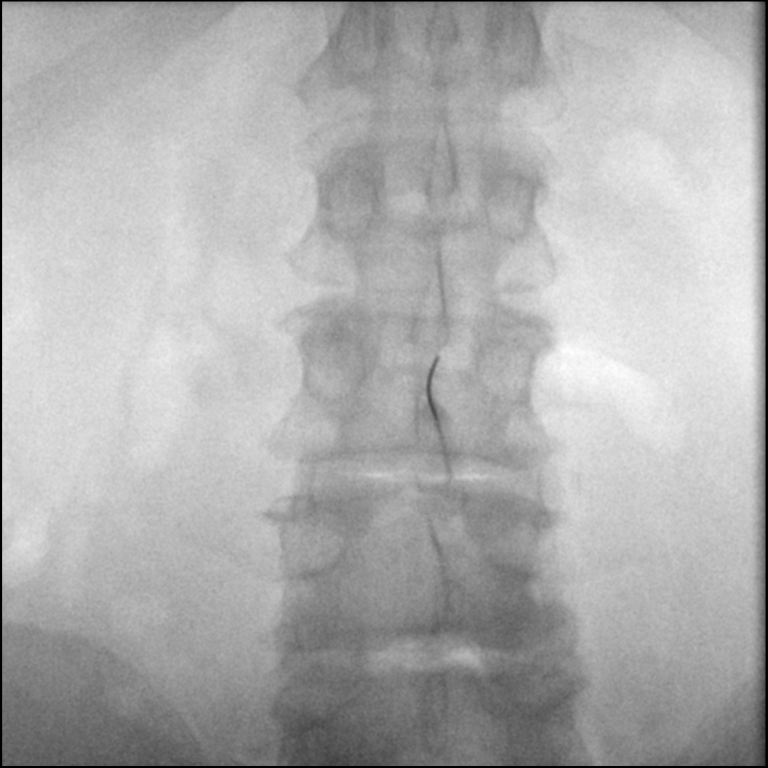

[Series 3: cp_standard · 0.17mm/px · 1 of 1 slices shown (3 of 7)]
[im 1/1]
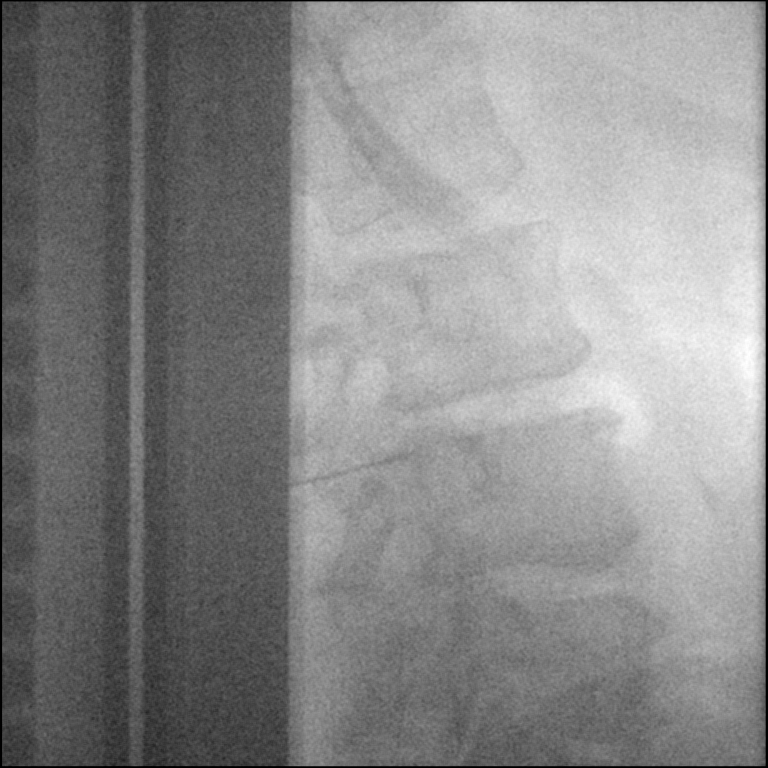

[Series 4: cp_standard · 0.17mm/px · 1 of 1 slices shown (4 of 7)]
[im 1/1]
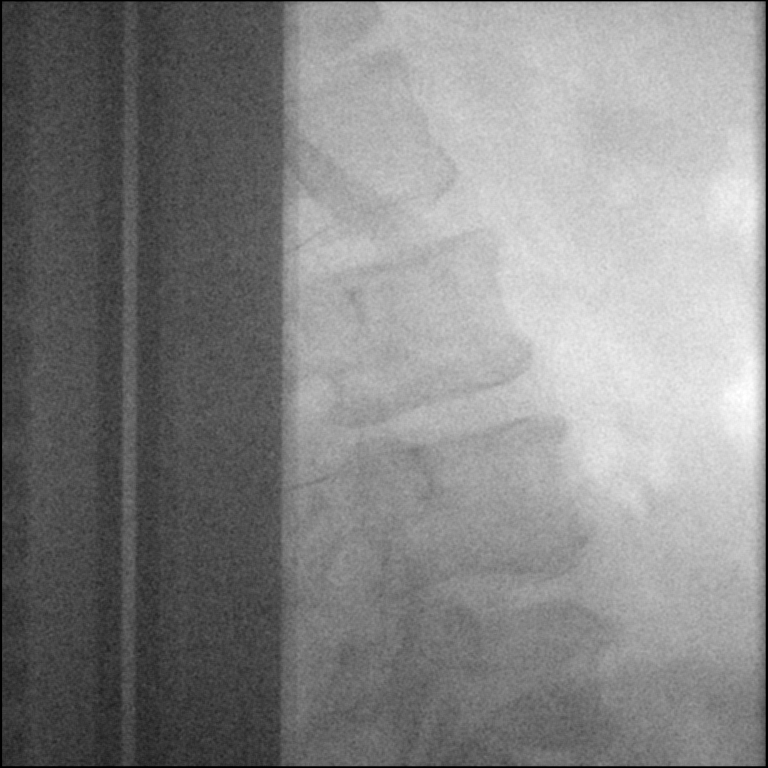

[Series 5: cp_standard · 0.17mm/px · 2 acquisitions, 5 frames shown (5 of 7)]
[im 1/2]
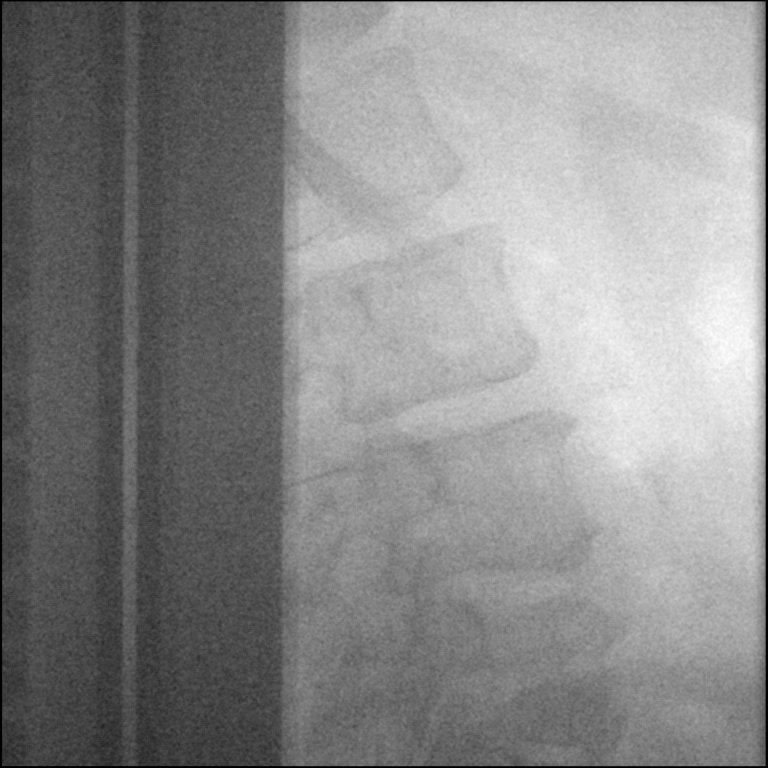
[im 2/2]
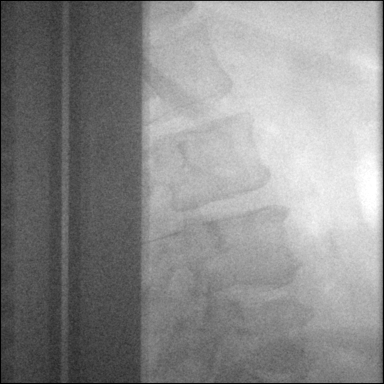
[im 2/2]
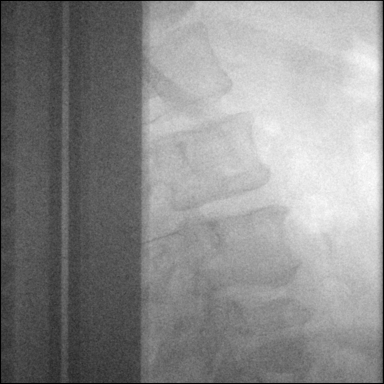
[im 2/2]
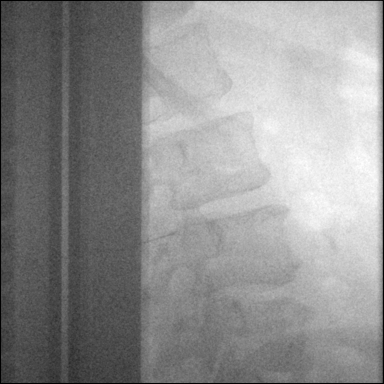
[im 2/2]
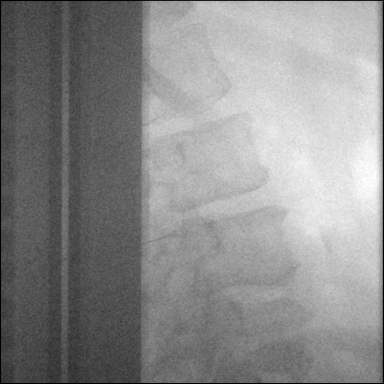

[Series 7: cp_standard · 0.17mm/px · 1 of 1 slices shown (6 of 7)]
[im 1/1]
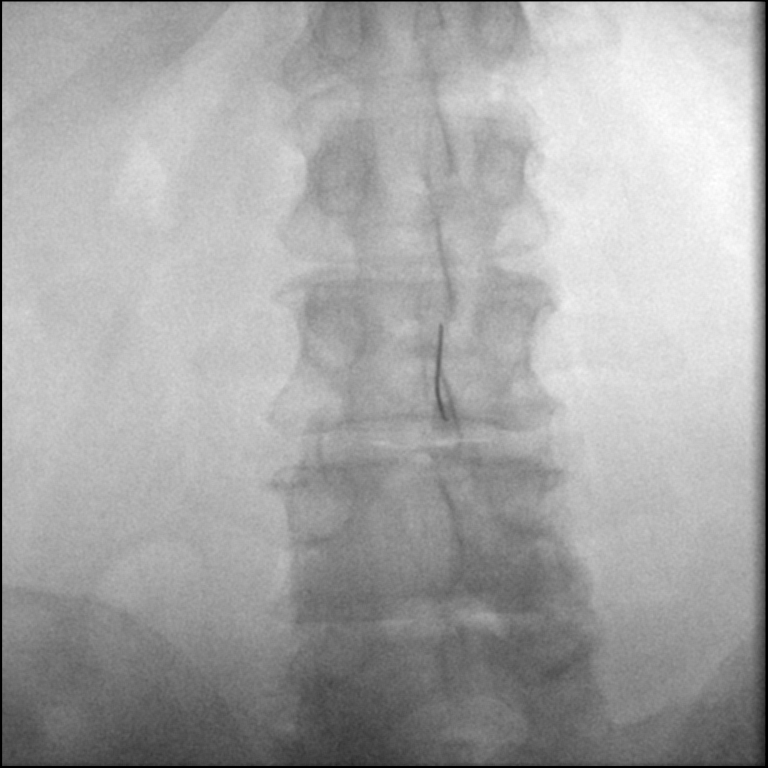

[Series 8: cp_standard · 0.17mm/px · 1 of 1 slices shown (7 of 7)]
[im 1/1]
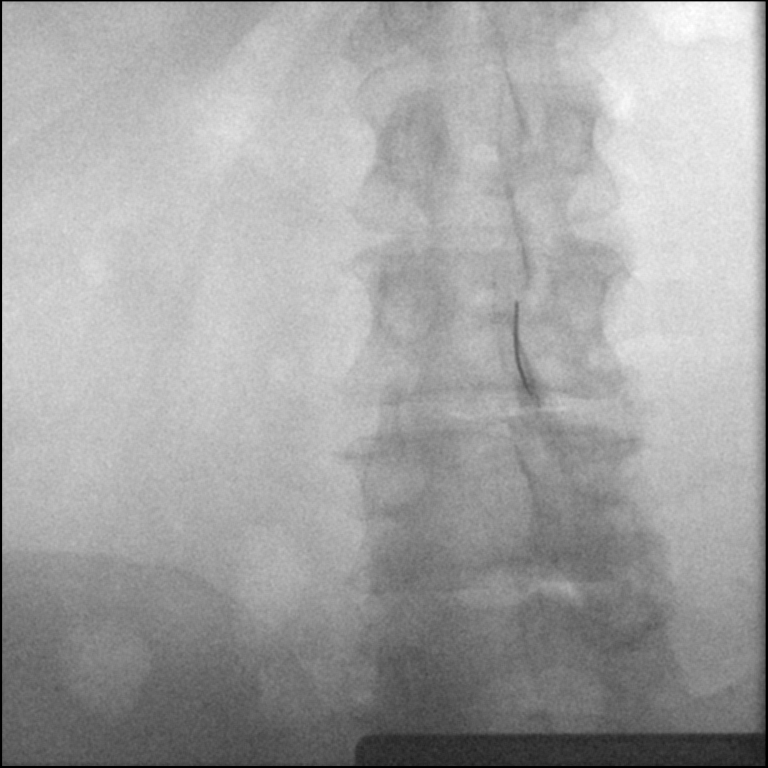

[11 of 11 positions shown; findings below may reference images not displayed]

EXAM:
DIAGNOSTIC LUMBAR PUNCTURE ATTEMPT UNDER FLUOROSCOPIC GUIDANCE

FLUOROSCOPY TIME:  Fluoroscopy Time:  1 minutes, 46 seconds

Radiation Exposure Index (if provided by the fluoroscopic device):
19.2 mGy

Number of Acquired Spot Images: 0

PROCEDURE:
I discussed the risks (including hemorrhage, infection, headache,
meningitis, and nerve damage, among others), benefits, and
alternatives to fluoroscopically guided lumbar puncture with the
patient. We discussed the high probability of success of the
procedure. The patient understood and elected to undergo the
procedure.

Standard time-out was employed. Following sterile skin prep and
local anesthetic administration consisting of 1 percent lidocaine, a
22 gauge spinal needle was advanced into the spinal canal at the
L3-4 level. With repositionings, a small amount of CSF was observed
in the needle hub. The CSF pressure seemed to be low and would not
flow with the patient prone, even with the head of the table tilted
up to 15 degrees.

I turned the patient onto his side in order to attempt to increase
the CSF flow by reducing the hydrostatic effects of the needle
orientation. This was not successful in causing the CSF to flow.
Lateral imaging was obtained showing the needle in the correct
position along the spinal canal. The needle was slowly withdrawn
from the midline in the spinal canal without any CSF flow.

At this point I considered the possibility of prominent epidural
adipose tissue or some other cause for the lack of flow in addition
to the low pressures. There is certainly some spinal stenosis in the
lumbar spine based on prior imaging. Accordingly, I re-prepped and
draped the patient and administered local anesthetic, and advanced
the needle in the midline at the L2-3 level into the spinal canal.
Once again, I obtained a tiny amount of CSF in the hub of the new
needle, but no adequate CSF flow. The CSF pressure seemed low. We
tilted the table again, and repositionings and rotations of the
needle performed and confirmed to be in the appropriate location at
fluoroscopy. However, sustained flow could not be obtained and
accordingly I could not be confident that medication administration
via the spinal needle would definitely into the thecal sac.

At this point, having tried 2 adjacent levels, the attempt at lumbar
puncture was stopped. The skin was cleansed and bandaged.
IMPRESSION: 1. Unsuccessful fluoroscopically guided lumbar puncture. Needle
positioning at the 2 levels (L2-3 and L3-4) attempted was correct
based on frontal and lateral fluoroscopic images, and a small amount
of CSF was noted in the needle hubs at both levels, but actual flow
of CSF through the needle could not be sustained despite turning the
patient lateral, tilting the table up, and needle repositioning at
each level. The lack of CSF flow meant I could be certain that the
needle was in the thecal sac as opposed to in the epidural space,
and accordingly the methotrexate could not be administered. I
suspect that the difficulty with the lumbar puncture is
multifactorial-the patient does have multilevel stenosis in the
lumbar spine-but was primarily related to low CSF pressures. I would
suggest aggressive hydration prior to any repeated attempts. Based
on the appearance of spinal stenosis, I would also recommend
avoiding the L4-5 level, as I did.

## 2018-03-29 MED ORDER — VINCRISTINE SULFATE CHEMO INJECTION 1 MG/ML
Freq: Once | INTRAVENOUS | Status: AC
  Administered 2018-03-29: 15:00:00 via INTRAVENOUS
  Filled 2018-03-29: qty 13

## 2018-03-29 MED ORDER — SODIUM CHLORIDE (PF) 0.9 % IJ SOLN
Freq: Once | INTRAMUSCULAR | Status: DC
  Filled 2018-03-29: qty 0.48

## 2018-03-29 MED ORDER — SODIUM CHLORIDE 0.9 % IV SOLN
Freq: Once | INTRAVENOUS | Status: AC
  Administered 2018-03-29: 8 mg via INTRAVENOUS
  Filled 2018-03-29: qty 4

## 2018-03-29 NOTE — Progress Notes (Signed)
HEMATOLOGY/ONCOLOGY INPATIENT PROGRESS NOTE  Date of Service: 03/29/2018  Inpatient Attending: .Brunetta Genera, MD   SUBJECTIVE:   Jesus [name] was seen in f/u for Jesus [name] C4D4. He notes no  prohibitive toxicities from continuing C4D3 EPOCH-R at this time. Lab results today (03/29/18) of CBC and BMP is as follows: all values are WNL except for RBC at 2.90, HGB at 8.6, HCT at 27.8, RDW 16.2, Glucose at 231.  Back pain has resolved and leg tingling nearly resolved. Eating well. Okay with reattempting LP with next cycle.    OBJECTIVE:  NAD  PHYSICAL EXAMINATION: . Vitals:   03/28/18 1423 03/28/18 2104 03/29/18 0431 03/29/18 1323  BP: (!) 141/73 134/90 119/72 124/83  Pulse: 79 85 66 69  Resp: 16 18 18 16   Temp: 97.9 F (36.6 C) 97.7 F (36.5 C) 98 F (36.7 C) (!) 97.4 F (36.3 C)  TempSrc: Oral Oral Oral Oral  SpO2: 93% 98% 96% 100%  Weight:      Height:       Filed Weights   03/27/18 1048  Weight: 266 lb 12.8 oz (121 kg)   .Body mass index is 36.18 kg/m.  GENERAL:alert, in no acute distress and comfortable SKIN: no acute rashes, no significant lesions EYES: conjunctiva are pink and non-injected, sclera anicteric OROPHARYNX: MMM, no exudates, no oropharyngeal erythema or ulceration NECK: supple, no JVD LYMPH:  no palpable lymphadenopathy in the cervical, axillary or inguinal regions LUNGS: clear to auscultation b/l with normal respiratory effort HEART: regular rate & rhythm ABDOMEN:  normoactive bowel sounds , non tender, not distended. No palpable hepatosplenomegaly.  Extremity: no pedal edema PSYCH: alert & oriented x 3 with fluent speech NEURO: no focal motor/sensory deficits   MEDICAL HISTORY:  Past Medical History:  Diagnosis Date  . Allergy   . History of kidney stones   . Hyperlipidemia   . Hypertension   . Lymphadenopathy   . Pain, lower leg    Bilateral  . Peripheral arterial disease ([place])   . Pre-diabetes   . Red-green color  blindness   . Snores   . Wears glasses     SURGICAL HISTORY: Past Surgical History:  Procedure Laterality Date  . CATARACT EXTRACTION W/ INTRAOCULAR LENS  IMPLANT, BILATERAL    . COLONOSCOPY    . dislodged salava stone    . FRACTURE SURGERY    . HAND ARTHROPLASTY  1995   crushed left hand  . INGUINAL LYMPH NODE BIOPSY Left 01/02/2018   Procedure: LEFT INGUINAL LYMPH NODE BIOPSY;  Surgeon: Rolm Bookbinder, MD;  Location: [facility];  Service: General;  Laterality: Left;  . IR IMAGING GUIDED PORT INSERTION  01/15/2018  . MICROLARYNGOSCOPY Left 01/17/2014   Procedure: MICROLARYNGOSCOPY WITH EXCISION OF THE BIOPSY OF LEFT VOCAL CORD LESION;  Surgeon: Izora Gala, MD;  Location: [facility];  Service: ENT;  Laterality: Left;  . ORIF FOOT FRACTURE  2005   left    SOCIAL HISTORY: Social History   Socioeconomic History  . Marital status: Divorced    Spouse name: Not on file  . Number of children: 3  . Years of education: Not on file  . Highest education level: Not on file  Occupational History  . Not on file  Social Needs  . Financial resource strain: Not on file  . Food insecurity:    Worry: Not on file    Inability: Not on file  . Transportation needs:    Medical: Not on file  Non-medical: Not on file  Tobacco Use  . Smoking status: Current Every Day Smoker    Packs/day: 0.50    Years: 36.00    Pack years: 18.00    Types: Cigarettes  . Smokeless tobacco: Never Used  Substance and Sexual Activity  . Alcohol use: Yes    Alcohol/week: 15.0 standard drinks    Types: 10 Cans of beer, 5 Shots of liquor per week    Comment: weekends  . Drug use: Yes    Types: Cocaine    Comment: reports cocaine usage ~2X/ month; last use 12/26/17  . Sexual activity: Not on file  Lifestyle  . Physical activity:    Days per week: Not on file    Minutes per session: Not on file  . Stress: Not on file  Relationships  . Social connections:    Talks on phone: Not on file     Gets together: Not on file    Attends religious service: Not on file    Active member of club or organization: Not on file    Attends meetings of clubs or organizations: Not on file    Relationship status: Not on file  . Intimate partner violence:    Fear of current or ex partner: Not on file    Emotionally abused: Not on file    Physically abused: Not on file    Forced sexual activity: Not on file  Other Topics Concern  . Not on file  Social History Narrative  . Not on file    FAMILY HISTORY: Family History  Problem Relation Age of Onset  . Breast cancer Mother   . Diabetes Father   . Hypertension Father   . Stroke Father   . Mental illness Sister   . Hypertension Daughter   . Mental illness Daughter   . Hypertension Brother   . Colon cancer Brother   . Breast cancer Sister     ALLERGIES:  is allergic to bee venom.  MEDICATIONS:  Scheduled Meds: . amLODipine  10 mg Oral Daily  . DOXOrubicin/vinCRIStine/etoposide CHEMO IV infusion for Inpatient CI   Intravenous Once  . lisinopril  20 mg Oral Daily  . methotrexate INTRATHECAL (+/- HYDROCORTISONE,Ara-C)   Intrathecal Once  . predniSONE  60 mg Oral QAC breakfast  . tamsulosin  0.4 mg Oral QPC supper   Continuous Infusions: . sodium chloride 20 mL/hr at 03/28/18 0600  . ondansetron (ZOFRAN) with dexamethasone (DECADRON) IV 8 mg (03/29/18 1421)   PRN Meds:.acetaminophen, hydrocortisone  REVIEW OF SYSTEMS:    A 10+ POINT REVIEW OF SYSTEMS WAS OBTAINED including neurology, dermatology, psychiatry, cardiac, respiratory, lymph, extremities, GI, GU, Musculoskeletal, constitutional, breasts, reproductive, HEENT.  All pertinent positives are noted in the HPI.  All others are negative.   LABORATORY DATA:  I have reviewed the data as listed  . CBC Latest Ref Rng & Units 03/29/2018 03/28/2018 03/27/2018  WBC 4.0 - 10.5 K/uL 10.0 8.5 14.1(H)  Hemoglobin 13.0 - 17.0 g/dL 8.6(L) 8.4(L) 9.3(L)  Hematocrit 39.0 - 52.0 % 27.8(L)  27.0(L) 29.1(L)  Platelets 150 - 400 K/uL 283 305 313    . CMP Latest Ref Rng & Units 03/29/2018 03/28/2018 03/27/2018  Glucose 70 - 99 mg/dL 231(H) 287(H) 171(H)  BUN 8 - 23 mg/dL 13 15 12   Creatinine 0.61 - 1.24 mg/dL 0.73 0.86 0.91  Sodium 135 - 145 mmol/L 139 139 142  Potassium 3.5 - 5.1 mmol/L 4.1 4.2 3.2(L)  Chloride 98 - 111 mmol/L 107 108  105  CO2 22 - 32 mmol/L 25 25 28   Calcium 8.9 - 10.3 mg/dL 9.3 8.3(L) 8.7(L)  Total Protein 6.5 - 8.1 g/dL - - 6.5  Total Bilirubin 0.3 - 1.2 mg/dL - - 0.6  Alkaline Phos 38 - 126 U/L - - 65  AST 15 - 41 U/L - - 21  ALT 0 - 44 U/L - - 14     RADIOGRAPHIC STUDIES: I have personally reviewed the radiological images as listed and agreed with the findings in the report. Nm Pet Image Restag (ps) Skull Base To Thigh  Result Date: 03/14/2018 CLINICAL DATA:  Subsequent treatment strategy for lymphoma. Status post 3 cycles of chemotherapy. EXAM: NUCLEAR MEDICINE PET SKULL BASE TO THIGH TECHNIQUE: 13.5 mCi F-18 FDG was injected intravenously. Full-ring PET imaging was performed from the skull base to thigh after the radiotracer. CT data was obtained and used for attenuation correction and anatomic localization. Fasting blood glucose: 159 mg/dl COMPARISON:  PET-CT 01/05/2018 FINDINGS: Mediastinal blood pool activity: SUV max 3.42 NECK: No hypermetabolic lymph nodes in the neck. Incidental CT findings: Right-sided Port-A-Cath in good position. CHEST: No supraclavicular, axillary, mediastinal or hilar lymphadenopathy. No worrisome pulmonary nodules. Incidental CT findings: none ABDOMEN/PELVIS: The hepatic duodenum ligament and periportal adenopathy has resolved. The 15 mm lymph node seen on the prior study now measures 6 mm. No residual hypermetabolism. Near complete resolution of the retroperitoneal adenopathy. Left para-aortic nodal mass on image number 138 measures 12 mm and previously measured 28 mm. No residual hypermetabolism. Left external iliac lymph  node on image number 149 measures 10 mm and previously measured 36 mm. Minimal residual hypermetabolism with SUV max of 3.2. This was previously 36.9. Left pelvic nodal mass measures 7.8 x 3.7 cm on image number 178 and previously measured 12.5 x 7.9 cm. The SUV max is 4.9 and was previously 36.7. 2.4 cm left inguinal lymph node previously measured 6.2 cm. SUV max is 4.65 and was previously 38.15. Incidental CT findings: none SKELETON: Diffuse metabolic activity throughout the bony structures is likely due to chemotherapy and or marrow stimulating drugs. Uptake in the right deltoid muscle may be from an IM injection. Incidental CT findings: none IMPRESSION: 1. PET-CT findings suggest an excellent response to chemotherapy. The abdominal lymphadenopathy has near completely resolved and demonstrates a near complete metabolic response. The pelvic and inguinal adenopathy has significantly decreased in size and the metabolic activity has significantly decreased. 2. Diffuse marrow activity likely due to chemotherapy and or marrow stimulating drugs. I do not see any discrete persistent lesions. Electronically Signed   By: Marijo Sanes M.D.   On: 03/14/2018 10:45   Dg Fluoro Guide Lumbar Puncture  Result Date: 03/29/2018 CLINICAL DATA:  B-cell lymphoma, diagnostic lumbar puncture and intrathecal methotrexate administration EXAM: DIAGNOSTIC LUMBAR PUNCTURE ATTEMPT UNDER FLUOROSCOPIC GUIDANCE FLUOROSCOPY TIME:  Fluoroscopy Time:  1 minutes, 46 seconds Radiation Exposure Index (if provided by the fluoroscopic device): 19.2 mGy Number of Acquired Spot Images: 0 PROCEDURE: I discussed the risks (including hemorrhage, infection, headache, meningitis, and nerve damage, among others), benefits, and alternatives to fluoroscopically guided lumbar puncture with the patient. We discussed the high probability of success of the procedure. The patient understood and elected to undergo the procedure. Standard time-out was employed.  Following sterile skin prep and local anesthetic administration consisting of 1 percent lidocaine, a 22 gauge spinal needle was advanced into the spinal canal at the L3-4 level. With repositionings, a small amount of CSF was observed in the needle  hub. The CSF pressure seemed to be low and would not flow with the patient prone, even with the head of the table tilted up to 15 degrees. I turned the patient onto his side in order to attempt to increase the CSF flow by reducing the hydrostatic effects of the needle orientation. This was not successful in causing the CSF to flow. Lateral imaging was obtained showing the needle in the correct position along the spinal canal. The needle was slowly withdrawn from the midline in the spinal canal without any CSF flow. At this point I considered the possibility of prominent epidural adipose tissue or some other cause for the lack of flow in addition to the low pressures. There is certainly some spinal stenosis in the lumbar spine based on prior imaging. Accordingly, I re-prepped and draped the patient and administered local anesthetic, and advanced the needle in the midline at the L2-3 level into the spinal canal. Once again, I obtained a tiny amount of CSF in the hub of the new needle, but no adequate CSF flow. The CSF pressure seemed low. We tilted the table again, and repositionings and rotations of the needle performed and confirmed to be in the appropriate location at fluoroscopy. However, sustained flow could not be obtained and accordingly I could not be confident that medication administration via the spinal needle would definitely into the thecal sac. At this point, having tried 2 adjacent levels, the attempt at lumbar puncture was stopped. The skin was cleansed and bandaged. IMPRESSION: 1. Unsuccessful fluoroscopically guided lumbar puncture. Needle positioning at the 2 levels (L2-3 and L3-4) attempted was correct based on frontal and lateral fluoroscopic images, and  a small amount of CSF was noted in the needle hubs at both levels, but actual flow of CSF through the needle could not be sustained despite turning the patient lateral, tilting the table up, and needle repositioning at each level. The lack of CSF flow meant I could be certain that the needle was in the thecal sac as opposed to in the epidural space, and accordingly the methotrexate could not be administered. 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 I would suggest aggressive hydration prior to any repeated attempts. Based on the appearance of spinal stenosis, I would also recommend avoiding the L4-5 level, as I did. Electronically Signed   By: Van Clines M.D.   On: 03/29/2018 13:31    ASSESSMENT & PLAN:  62 y.o. 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  CT of the abdomen and pelvis performed on 12/22/2017 showed bulky left inguinal, left hemipelvic, and retroperitoneal adenopathy.   01/02/18 Left inguinal LN Biopsy revealed T-Cell/histocyte rich Large B-Cell Lymphoma  12/27/17 ECHO revealed LV EF of 55-60%   01/05/18 PET/CT revealedMassively enlarged pelvic lymph nodes intense metabolic activity consistent lymphoma. 2. Additional hypermetabolic lymph nodes in the porta hepatis and retroperitoneum LEFT aorta. 3. Solitary hypermetabolic mediastinal lymph node in the upper LEFT Mediastinum. 4. 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 5. Normal spleen.  HIV non reactive on 12/22/2017.Hep C and hep B serology negative.  03/14/18 PET/CT revealedPET-CT findings suggest an excellent response to  chemotherapy. The abdominal lymphadenopathy has near completely resolved and demonstrates a near complete metabolic response. The pelvic and  inguinal adenopathy has significantly decreased in size and the metabolic activity has significantly decreased. 2. Diffuse marrow activity likely due to chemotherapy and or marrow stimulating drugs. I do not see any discrete persistent lesions.   2) left lower extremity swelling- now resolved Doppler ultrasound for DVT was negative in the left lower extremity.  Likely from venous compression +/- lymphatic obstruction from bulky left inguinal, left hemipelvic, and retroperitoneal adenopathy.   3) Intermediate to high risk for tumor lysis syndrome.- no TLS noted with allopurinol prophylaxis after C1. Off allopurinol.  4) S/p Port a cath placement   5) h/o E.coli UTI - Pansensitive -Resolved.Also appearedto have BPH like symptomatology.  6) Hypokalemia resolved with K replacement. Likely from potassium wasting from high dose steroids PLAN: -labs reviewed with patient.  -no prohibitive toxicities and can continue [facility] C4D4 today. -unsuccessful attempt at LP with IT MTX by IR. Patients back pain and leg tingling nearly resolved. He wants to wait thill next cycle to attempt this again. -Will complete Rituxan and Neulasta as outpatient on 04/02/18 as scheduled -RTC with Dr Irene Limbo on 04/09/2018 with labs  5) DVT prophylaxis -lovenox, SCD, ambulation.  6) HTN -Continue outpatient antihypertensives and monitor  7) hemorrhoidal bleeding- resolved. hgb stable. Prn anusol  The total time spent in the appt was 25 minutes and more than 50% was on counseling and direct patient cares.    Sullivan Lone MD MS AAHIVMS [facility] Hematology/Oncology Physician [facility]  (Office):       [phone] (Work cell):  [phone] (Fax):           [phone]  03/29/2018 2:42 PM   I, Baldwin [place], am acting as a scribe for Dr. Sullivan Lone.   .I have reviewed the above documentation for accuracy and completeness, and I agree with the above. Sullivan Lone MD MS

## 2018-03-29 NOTE — Procedures (Signed)
Pre-procedure diagnosis: B-cell lymphoma.  CLINICAL DATA: B-cell lymphoma, diagnostic lumbar puncture and intrathecal methotrexate administration  EXAM:  DIAGNOSTIC LUMBAR PUNCTURE ATTEMPT UNDER FLUOROSCOPIC GUIDANCE  FLUOROSCOPY TIME: Fluoroscopy Time:  1 minutes, 46 seconds  Radiation Exposure Index (if provided by the fluoroscopic device):  19.2 mGy  Number of Acquired Spot Images: 0  PROCEDURE:  I discussed the risks (including hemorrhage, infection, headache, meningitis, and nerve damage, among others), benefits, and alternatives to fluoroscopically guided lumbar puncture with the patient.  We discussed the high probability of success of the procedure. The patient understood and elected to undergo the procedure.    Standard time-out was employed.  Following sterile skin prep and local anesthetic administration consisting of 1 percent lidocaine, a 22 gauge spinal needle was advanced into the spinal canal at the L3-4 level.  With repositionings, a small amount of CSF was observed in the needle hub.  The CSF pressure seemed to be low and would not flow with the patient prone, even with the head of the table tilted up to 15 degrees.   I turned the patient onto his side in order to attempt to increase the CSF flow by reducing the hydrostatic effects of the needle orientation.  This was not successful in causing the CSF to flow.  Lateral imaging was obtained showing the needle in the correct position along the spinal canal.  The needle was slowly withdrawn from the midline in the spinal canal without any CSF flow.   At this point I considered the possibility of prominent epidural adipose tissue or some other cause for the lack of flow in addition to the low pressures.  There is certainly some spinal stenosis in the lumbar spine based on prior imaging.  Accordingly, I re-prepped and draped the patient and administered local anesthetic, and advanced the needle in the midline at the L2-3 level into  the spinal canal.  Once again, I obtained a tiny amount of CSF in the hub of the new needle, but no adequate CSF flow.  The CSF pressure seemed low.  We tilted the table again, and repositionings and rotations of the needle performed and confirmed to be in the appropriate location at fluoroscopy.  However, sustained flow could not be obtained and accordingly I could not be confident that medication administration via the spinal needle would definitely into the thecal sac.    At this point, having tried 2 adjacent levels, the attempt at lumbar puncture was stopped.  The skin was cleansed and bandaged.   IMPRESSION:  Unsuccessful fluoroscopically guided lumbar puncture.  Needle positioning at the 2 levels (L2-3 and L3-4) attempted was correct based on frontal and lateral fluoroscopic images, and a small amount of CSF was noted in the needle hubs at both levels, but actual flow of CSF through the needle could not be sustained despite turning the patient lateral, tilting the table up, and needle repositioning at each level.  The lack of CSF flow meant I could be certain that the needle was in the thecal sac as opposed to in the epidural space, and accordingly the methotrexate could not be administered.  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  I would suggest aggressive hydration prior to any repeated attempts.  Based on the appearance of spinal stenosis, I would also recommend avoiding the L4-5 level, as I did.

## 2018-03-30 LAB — CBC
HCT: 29.9 % — ABNORMAL LOW (ref 39.0–52.0)
Hemoglobin: 9.4 g/dL — ABNORMAL LOW (ref 13.0–17.0)
MCH: 29.8 pg (ref 26.0–34.0)
MCHC: 31.4 g/dL (ref 30.0–36.0)
MCV: 94.9 fL (ref 80.0–100.0)
Platelets: 303 10*3/uL (ref 150–400)
RBC: 3.15 MIL/uL — ABNORMAL LOW (ref 4.22–5.81)
RDW: 15.9 % — ABNORMAL HIGH (ref 11.5–15.5)
WBC: 8.1 10*3/uL (ref 4.0–10.5)
nRBC: 0 % (ref 0.0–0.2)

## 2018-03-30 LAB — BASIC METABOLIC PANEL
BUN: 15 mg/dL (ref 8–23)
CO2: 26 mmol/L (ref 22–32)
Chloride: 108 mmol/L (ref 98–111)
Glucose, Bld: 175 mg/dL — ABNORMAL HIGH (ref 70–99)
Potassium: 3.8 mmol/L (ref 3.5–5.1)

## 2018-03-30 LAB — BASIC METABOLIC PANEL WITH GFR
Anion gap: 6 (ref 5–15)
Calcium: 9.2 mg/dL (ref 8.9–10.3)
Creatinine, Ser: 0.79 mg/dL (ref 0.61–1.24)
GFR calc Af Amer: >60 mL/min (ref >60)
GFR calc non Af Amer: >60 mL/min (ref >60)
Sodium: 140 mmol/L (ref 135–145)

## 2018-03-30 MED ORDER — VINCRISTINE SULFATE CHEMO INJECTION 1 MG/ML
Freq: Once | INTRAVENOUS | Status: AC
  Administered 2018-03-30: 15:00:00 via INTRAVENOUS
  Filled 2018-03-30: qty 13

## 2018-03-30 MED ORDER — SODIUM CHLORIDE 0.9 % IV SOLN
Freq: Once | INTRAVENOUS | Status: AC
  Administered 2018-03-31: 16 mg via INTRAVENOUS
  Filled 2018-03-30: qty 8

## 2018-03-30 MED ORDER — SODIUM CHLORIDE 0.9 % IV SOLN
750.0000 mg/m2 | Freq: Once | INTRAVENOUS | Status: AC
  Administered 2018-03-31: 1980 mg via INTRAVENOUS
  Filled 2018-03-30: qty 99

## 2018-03-30 MED ORDER — SODIUM CHLORIDE 0.9 % IV SOLN
Freq: Once | INTRAVENOUS | Status: AC
  Administered 2018-03-30: 18 mg via INTRAVENOUS
  Filled 2018-03-30: qty 4

## 2018-03-30 NOTE — Progress Notes (Signed)
EPOCH dosage and calculations verified with Adline Peals, RN.

## 2018-03-31 LAB — CBC
HCT: 28.6 % — ABNORMAL LOW (ref 39.0–52.0)
Hemoglobin: 9.1 g/dL — ABNORMAL LOW (ref 13.0–17.0)
MCH: 29.5 pg (ref 26.0–34.0)
MCHC: 31.8 g/dL (ref 30.0–36.0)
MCV: 92.9 fL (ref 80.0–100.0)
Platelets: 289 10*3/uL (ref 150–400)
RBC: 3.08 MIL/uL — ABNORMAL LOW (ref 4.22–5.81)
RDW: 15.4 % (ref 11.5–15.5)
WBC: 4.8 10*3/uL (ref 4.0–10.5)
nRBC: 0 % (ref 0.0–0.2)

## 2018-03-31 LAB — BASIC METABOLIC PANEL
Anion gap: 6 (ref 5–15)
CO2: 28 mmol/L (ref 22–32)
Chloride: 107 mmol/L (ref 98–111)
GFR calc Af Amer: >60 mL/min (ref >60)
Potassium: 3.6 mmol/L (ref 3.5–5.1)
Sodium: 141 mmol/L (ref 135–145)

## 2018-03-31 LAB — BASIC METABOLIC PANEL WITH GFR
BUN: 19 mg/dL (ref 8–23)
Calcium: 9.2 mg/dL (ref 8.9–10.3)
Creatinine, Ser: 0.78 mg/dL (ref 0.61–1.24)
GFR calc non Af Amer: >60 mL/min (ref >60)
Glucose, Bld: 195 mg/dL — ABNORMAL HIGH (ref 70–99)

## 2018-03-31 MED ORDER — HEPARIN SOD (PORK) LOCK FLUSH 100 UNIT/ML IV SOLN
500.0000 [IU] | INTRAVENOUS | Status: DC | PRN
  Filled 2018-03-31: qty 5

## 2018-03-31 NOTE — Discharge Summary (Signed)
[place]  [phone] [phone]    Physician Discharge Summary     Patient ID: Jesus [name] MRN: [id number] DOB/AGE: Aug 17, 1955 62 y.o.  Admit date: 03/27/2018 Discharge date: 03/31/2018  Primary Care Physician:  Shirline Frees, MD   Discharge Diagnoses:    Present on Admission: . Diffuse large B cell lymphoma [facility])   Discharge Medications:  Allergies as of 03/31/2018      Reactions   Bee Venom Anaphylaxis      Medication List    STOP taking these medications   mupirocin ointment 2 % Commonly known as:  BACTROBAN     TAKE these medications   acetaminophen 500 MG tablet Commonly known as:  TYLENOL Take 1,000 mg by mouth every 6 (six) hours as needed for mild pain.   amLODipine 10 MG tablet Commonly known as:  NORVASC Take 10 mg by mouth daily.   aspirin 81 MG tablet Take 81 mg by mouth daily.   CIALIS 20 MG tablet Generic drug:  tadalafil Take 20 mg by mouth daily as needed for erectile dysfunction.   dexamethasone 4 MG tablet Commonly known as:  DECADRON Take 2 tablets (8 mg total) by mouth 2 (two) times daily with a meal. Take two times a day starting the day after chemotherapy for 3 days.   hydrocortisone 2.5 % rectal cream Commonly known as:  ANUSOL-HC Place 1 application rectally 2 (two) times daily as needed for hemorrhoids.   lisinopril 20 MG tablet Commonly known as:  PRINIVIL,ZESTRIL Take 20 mg by mouth daily.   LORazepam 0.5 MG tablet Commonly known as:  ATIVAN Take 1 tablet (0.5 mg total) by mouth every 6 (six) hours as needed (Nausea or vomiting).   multivitamin with minerals tablet Take 1 tablet by mouth daily.   ondansetron 8 MG tablet Commonly known as:  ZOFRAN Take 1 tablet (8 mg total) by mouth every 8 (eight) hours as needed for nausea or vomiting.   prochlorperazine 10 MG tablet Commonly known as:  COMPAZINE Take 1 tablet (10 mg total) by mouth every 6 (six) hours  as needed (Nausea or vomiting).   tamsulosin 0.4 MG Caps capsule Commonly known as:  FLOMAX Take 1 capsule (0.4 mg total) by mouth daily after supper.        Disposition and Follow-up:   Significant Diagnostic Studies:  Nm Pet Image Restag (ps) Skull Base To Thigh  Result Date: 03/14/2018 CLINICAL DATA:  Subsequent treatment strategy for lymphoma. Status post 3 cycles of chemotherapy. EXAM: NUCLEAR MEDICINE PET SKULL BASE TO THIGH TECHNIQUE: 13.5 mCi F-18 FDG was injected intravenously. Full-ring PET imaging was performed from the skull base to thigh after the radiotracer. CT data was obtained and used for attenuation correction and anatomic localization. Fasting blood glucose: 159 mg/dl COMPARISON:  PET-CT 01/05/2018 FINDINGS: Mediastinal blood pool activity: SUV max 3.42 NECK: No hypermetabolic lymph nodes in the neck. Incidental CT findings: Right-sided Port-A-Cath in good position. CHEST: No supraclavicular, axillary, mediastinal or hilar lymphadenopathy. No worrisome pulmonary nodules. Incidental CT findings: none ABDOMEN/PELVIS: The hepatic duodenum ligament and periportal adenopathy has resolved. The 15 mm lymph node seen on the prior study now measures 6 mm. No residual hypermetabolism. Near complete resolution of the retroperitoneal adenopathy. Left para-aortic nodal mass on image number 138 measures 12 mm and previously measured 28 mm. No residual hypermetabolism. Left external iliac lymph node on image number 149 measures 10 mm and previously measured 36 mm. Minimal residual hypermetabolism  with SUV max of 3.2. This was previously 36.9. Left pelvic nodal mass measures 7.8 x 3.7 cm on image number 178 and previously measured 12.5 x 7.9 cm. The SUV max is 4.9 and was previously 36.7. 2.4 cm left inguinal lymph node previously measured 6.2 cm. SUV max is 4.65 and was previously 38.15. Incidental CT findings: none SKELETON: Diffuse metabolic activity throughout the bony structures is likely due  to chemotherapy and or marrow stimulating drugs. Uptake in the right deltoid muscle may be from an IM injection. Incidental CT findings: none IMPRESSION: 1. PET-CT findings suggest an excellent response to chemotherapy. The abdominal lymphadenopathy has near completely resolved and demonstrates a near complete metabolic response. The pelvic and inguinal adenopathy has significantly decreased in size and the metabolic activity has significantly decreased. 2. Diffuse marrow activity likely due to chemotherapy and or marrow stimulating drugs. I do not see any discrete persistent lesions. Electronically Signed   By: Marijo Sanes M.D.   On: 03/14/2018 10:45   Dg Fluoro Guide Lumbar Puncture  Result Date: 03/29/2018 CLINICAL DATA:  B-cell lymphoma, diagnostic lumbar puncture and intrathecal methotrexate administration EXAM: DIAGNOSTIC LUMBAR PUNCTURE ATTEMPT UNDER FLUOROSCOPIC GUIDANCE FLUOROSCOPY TIME:  Fluoroscopy Time:  1 minutes, 46 seconds Radiation Exposure Index (if provided by the fluoroscopic device): 19.2 mGy Number of Acquired Spot Images: 0 PROCEDURE: I discussed the risks (including hemorrhage, infection, headache, meningitis, and nerve damage, among others), benefits, and alternatives to fluoroscopically guided lumbar puncture with the patient. We discussed the high probability of success of the procedure. The patient understood and elected to undergo the procedure. Standard time-out was employed. Following sterile skin prep and local anesthetic administration consisting of 1 percent lidocaine, a 22 gauge spinal needle was advanced into the spinal canal at the L3-4 level. With repositionings, a small amount of CSF was observed in the needle hub. The CSF pressure seemed to be low and would not flow with the patient prone, even with the head of the table tilted up to 15 degrees. I turned the patient onto his side in order to attempt to increase the CSF flow by reducing the hydrostatic effects of the  needle orientation. This was not successful in causing the CSF to flow. Lateral imaging was obtained showing the needle in the correct position along the spinal canal. The needle was slowly withdrawn from the midline in the spinal canal without any CSF flow. At this point I considered the possibility of prominent epidural adipose tissue or some other cause for the lack of flow in addition to the low pressures. There is certainly some spinal stenosis in the lumbar spine based on prior imaging. Accordingly, I re-prepped and draped the patient and administered local anesthetic, and advanced the needle in the midline at the L2-3 level into the spinal canal. Once again, I obtained a tiny amount of CSF in the hub of the new needle, but no adequate CSF flow. The CSF pressure seemed low. We tilted the table again, and repositionings and rotations of the needle performed and confirmed to be in the appropriate location at fluoroscopy. However, sustained flow could not be obtained and accordingly I could not be confident that medication administration via the spinal needle would definitely into the thecal sac. At this point, having tried 2 adjacent levels, the attempt at lumbar puncture was stopped. The skin was cleansed and bandaged. IMPRESSION: 1. Unsuccessful fluoroscopically guided lumbar puncture. Needle positioning at the 2 levels (L2-3 and L3-4) attempted was correct based on frontal and  lateral fluoroscopic images, and a small amount of CSF was noted in the needle hubs at both levels, but actual flow of CSF through the needle could not be sustained despite turning the patient lateral, tilting the table up, and needle repositioning at each level. The lack of CSF flow meant I could be certain that the needle was in the thecal sac as opposed to in the epidural space, and accordingly the methotrexate could not be administered. 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 I would suggest aggressive hydration prior to any repeated attempts. Based on the appearance of spinal stenosis, I would also recommend avoiding the L4-5 level, as I did. Electronically Signed   By: Van Clines M.D.   On: 03/29/2018 13:31    Discharge Laboratory Values: . CBC Latest Ref Rng & Units 03/31/2018 03/30/2018 03/29/2018  WBC 4.0 - 10.5 K/uL 4.8 8.1 10.0  Hemoglobin 13.0 - 17.0 g/dL 9.1(L) 9.4(L) 8.6(L)  Hematocrit 39.0 - 52.0 % 28.6(L) 29.9(L) 27.8(L)  Platelets 150 - 400 K/uL 289 303 283   . CMP Latest Ref Rng & Units 03/31/2018 03/30/2018 03/29/2018  Glucose 70 - 99 mg/dL 195(H) 175(H) 231(H)  BUN 8 - 23 mg/dL 19 15 13   Creatinine 0.61 - 1.24 mg/dL 0.78 0.79 0.73  Sodium 135 - 145 mmol/L 141 140 139  Potassium 3.5 - 5.1 mmol/L 3.6 3.8 4.1  Chloride 98 - 111 mmol/L 107 108 107  CO2 22 - 32 mmol/L 28 26 25   Calcium 8.9 - 10.3 mg/dL 9.2 9.2 9.3  Total Protein 6.5 - 8.1 g/dL - - -  Total Bilirubin 0.3 - 1.2 mg/dL - - -  Alkaline Phos 38 - 126 U/L - - -  AST 15 - 41 U/L - - -  ALT 0 - 44 U/L - - -     Brief H and P: For complete details please refer to admission H and P, but in brief, Jesus [name] is a wonderful 62 y.o. male who has been admitted today for C4 EPOCH-R treatment of his T-Cell/histocyte rich Large B-Cell Lymphoma. [name].   The pt reports that he has not developed any new concerns. He has continued eating well and is staying hydrated. He has continued moving his bowel well, denies leg swelling and denies any abdominal pains.  Minimal grade 1 tingling/numbness in fingertips likely from vincristine. No other prohibitive toxicities noted.  We discussed and an informed consent was again obtained to proceed with Intrathecal methotrexate on D3 of his current and next cycles of treatment for CNS prophylaxis.  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  CT of the abdomen and pelvis performed on 12/22/2017 showed bulky left inguinal, left hemipelvic, and retroperitoneal adenopathy.   01/02/18 Left inguinal LN Biopsy revealed T-Cell/histocyte rich Large B-Cell Lymphoma  12/27/17 ECHO revealed LV EF of 55-60%   01/05/18 PET/CT revealedMassively enlarged pelvic lymph nodes intense metabolic activity consistent lymphoma. 2. Additional hypermetabolic lymph nodes in the porta hepatis and retroperitoneum LEFT aorta. 3. Solitary hypermetabolic mediastinal lymph node in the upper LEFT Mediastinum. 4. 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 5. Normal spleen.  HIV non reactive on 12/22/2017.Hep  C and hep B serology negative.  03/14/18 PET/CT revealedPET-CT findings suggest an excellent response to chemotherapy. The abdominal lymphadenopathy has near completely resolved and demonstrates a near complete metabolic response. The pelvic and inguinal adenopathy has significantly decreased in size and the metabolic activity has significantly decreased. 2. Diffuse marrow activity likely due to chemotherapy and or marrow stimulating drugs. I do not see any discrete persistent lesions.   2) left lower extremity swelling- now resolved Doppler ultrasound for DVT was negative in the left lower extremity.  Likely from venous compression +/- lymphatic obstruction from bulky left inguinal, left hemipelvic, and retroperitoneal adenopathy.   3) Intermediate to high risk for tumor lysis syndrome.- no TLS noted with allopurinol prophylaxis after C1. Off allopurinol.  4) S/p Port a cath placement   5)h/oE.coli UTI - Pansensitive  -Resolved.Also appearedto have BPH like symptomatology.  6) Hypokalemia resolved with K replacement. Likely from potassium wasting from high dose steroids PLAN: -labs reviewed with patient prior to discharge -no prohibitive toxicities and can continue [facility] C4D5 today. -unsuccessful attempt at LP with IT MTX by IR. Patients back pain and leg tingling now resolved. He wants to wait till next cycle to attempt this again. -Will complete Rituxan and Neulasta as outpatient on 11/25/19as scheduled -RTC with Dr Irene Limbo on 04/09/2018 with labs  5) DVT prophylaxis -lovenox (held for 2 days for LP), SCD, ambulation.  6) HTN -Continue outpatient antihypertensives and monitor  7) hemorrhoidal bleeding- resolved. hgb stable. Prn anusol   Physical Exam at Discharge: BP 122/87 (BP Location: Left Arm)   Pulse 71   Temp 97.8 F (36.6 C) (Oral)   Resp 14   Ht 6' (1.829 m)   Wt 266 lb 12.8 oz (121 kg)   SpO2 95%   BMI 36.18 kg/m  . GENERAL:alert, in no acute distress and comfortable SKIN: no acute rashes, no significant lesions EYES: conjunctiva are pink and non-injected, sclera anicteric OROPHARYNX: MMM, no exudates, no oropharyngeal erythema or ulceration NECK: supple, no JVD LYMPH:  no palpable lymphadenopathy in the cervical, axillary or inguinal regions LUNGS: clear to auscultation b/l with normal respiratory effort HEART: regular rate & rhythm ABDOMEN:  normoactive bowel sounds , non tender, not distended. Extremity: no pedal edema PSYCH: alert & oriented x 3 with fluent speech NEURO: no focal motor/sensory deficits  Hospital Course:  Active Problems:   Diffuse large B cell lymphoma (HCC)   Hypokalemia   Diet:  Regular   Activity: Appropriate infection prevention strategies as directed  Condition at Discharge:   Stable  Signed: Dr. Sullivan Lone MD [place] [phone]  03/31/2018, 11:33 AM  TT spent discharging patient > 54mns

## 2018-04-02 ENCOUNTER — Inpatient Hospital Stay: Payer: 59

## 2018-04-02 VITALS — BP 100/58 | HR 87 | Temp 99.2°F | Resp 16

## 2018-04-02 DIAGNOSIS — Z7982 Long term (current) use of aspirin: Secondary | ICD-10-CM | POA: Diagnosis not present

## 2018-04-02 DIAGNOSIS — F1410 Cocaine abuse, uncomplicated: Secondary | ICD-10-CM | POA: Diagnosis not present

## 2018-04-02 DIAGNOSIS — Z5112 Encounter for antineoplastic immunotherapy: Secondary | ICD-10-CM | POA: Diagnosis not present

## 2018-04-02 DIAGNOSIS — R0683 Snoring: Secondary | ICD-10-CM | POA: Diagnosis not present

## 2018-04-02 DIAGNOSIS — R350 Frequency of micturition: Secondary | ICD-10-CM | POA: Diagnosis not present

## 2018-04-02 DIAGNOSIS — G629 Polyneuropathy, unspecified: Secondary | ICD-10-CM | POA: Diagnosis not present

## 2018-04-02 DIAGNOSIS — Z7689 Persons encountering health services in other specified circumstances: Secondary | ICD-10-CM | POA: Diagnosis not present

## 2018-04-02 DIAGNOSIS — R61 Generalized hyperhidrosis: Secondary | ICD-10-CM | POA: Diagnosis not present

## 2018-04-02 DIAGNOSIS — C8338 Diffuse large B-cell lymphoma, lymph nodes of multiple sites: Secondary | ICD-10-CM | POA: Diagnosis present

## 2018-04-02 DIAGNOSIS — R600 Localized edema: Secondary | ICD-10-CM | POA: Diagnosis not present

## 2018-04-02 DIAGNOSIS — Z79899 Other long term (current) drug therapy: Secondary | ICD-10-CM | POA: Diagnosis not present

## 2018-04-02 DIAGNOSIS — Z7901 Long term (current) use of anticoagulants: Secondary | ICD-10-CM | POA: Diagnosis not present

## 2018-04-02 DIAGNOSIS — I739 Peripheral vascular disease, unspecified: Secondary | ICD-10-CM | POA: Diagnosis not present

## 2018-04-02 DIAGNOSIS — E785 Hyperlipidemia, unspecified: Secondary | ICD-10-CM | POA: Diagnosis not present

## 2018-04-02 DIAGNOSIS — F17210 Nicotine dependence, cigarettes, uncomplicated: Secondary | ICD-10-CM | POA: Diagnosis not present

## 2018-04-02 DIAGNOSIS — Z800 Family history of malignant neoplasm of digestive organs: Secondary | ICD-10-CM | POA: Diagnosis not present

## 2018-04-02 DIAGNOSIS — R590 Localized enlarged lymph nodes: Secondary | ICD-10-CM | POA: Diagnosis not present

## 2018-04-02 DIAGNOSIS — Z7189 Other specified counseling: Secondary | ICD-10-CM

## 2018-04-02 DIAGNOSIS — Z803 Family history of malignant neoplasm of breast: Secondary | ICD-10-CM | POA: Diagnosis not present

## 2018-04-02 DIAGNOSIS — L989 Disorder of the skin and subcutaneous tissue, unspecified: Secondary | ICD-10-CM | POA: Diagnosis not present

## 2018-04-02 DIAGNOSIS — R7303 Prediabetes: Secondary | ICD-10-CM | POA: Diagnosis not present

## 2018-04-02 DIAGNOSIS — I10 Essential (primary) hypertension: Secondary | ICD-10-CM | POA: Diagnosis not present

## 2018-04-02 MED ORDER — ACETAMINOPHEN 325 MG PO TABS
650.0000 mg | ORAL_TABLET | Freq: Once | ORAL | Status: AC
  Administered 2018-04-02: 650 mg via ORAL

## 2018-04-02 MED ORDER — METHYLPREDNISOLONE SODIUM SUCC 125 MG IJ SOLR
INTRAMUSCULAR | Status: AC
  Filled 2018-04-02: qty 2

## 2018-04-02 MED ORDER — ACETAMINOPHEN 325 MG PO TABS
ORAL_TABLET | ORAL | Status: AC
  Filled 2018-04-02: qty 2

## 2018-04-02 MED ORDER — METHYLPREDNISOLONE SODIUM SUCC 125 MG IJ SOLR
80.0000 mg | Freq: Every day | INTRAMUSCULAR | Status: DC
  Administered 2018-04-02: 80 mg via INTRAVENOUS

## 2018-04-02 MED ORDER — DIPHENHYDRAMINE HCL 25 MG PO CAPS
50.0000 mg | ORAL_CAPSULE | Freq: Once | ORAL | Status: AC
  Administered 2018-04-02: 50 mg via ORAL

## 2018-04-02 MED ORDER — PEGFILGRASTIM-CBQV 6 MG/0.6ML ~~LOC~~ SOSY
6.0000 mg | PREFILLED_SYRINGE | Freq: Once | SUBCUTANEOUS | Status: AC
  Administered 2018-04-02: 6 mg via SUBCUTANEOUS

## 2018-04-02 MED ORDER — PEGFILGRASTIM-CBQV 6 MG/0.6ML ~~LOC~~ SOSY
PREFILLED_SYRINGE | SUBCUTANEOUS | Status: AC
  Filled 2018-04-02: qty 0.6

## 2018-04-02 MED ORDER — DIPHENHYDRAMINE HCL 25 MG PO CAPS
ORAL_CAPSULE | ORAL | Status: AC
  Filled 2018-04-02: qty 2

## 2018-04-02 MED ORDER — HEPARIN SOD (PORK) LOCK FLUSH 100 UNIT/ML IV SOLN
500.0000 [IU] | Freq: Once | INTRAVENOUS | Status: AC | PRN
  Administered 2018-04-02: 500 [IU]
  Filled 2018-04-02: qty 5

## 2018-04-02 MED ORDER — SODIUM CHLORIDE 0.9 % IV SOLN
Freq: Once | INTRAVENOUS | Status: AC
  Administered 2018-04-02: 08:00:00 via INTRAVENOUS
  Filled 2018-04-02: qty 250

## 2018-04-02 MED ORDER — SODIUM CHLORIDE 0.9 % IV SOLN
375.0000 mg/m2 | Freq: Once | INTRAVENOUS | Status: AC
  Administered 2018-04-02: 1000 mg via INTRAVENOUS
  Filled 2018-04-02: qty 100

## 2018-04-02 MED ORDER — SODIUM CHLORIDE 0.9% FLUSH
10.0000 mL | INTRAVENOUS | Status: DC | PRN
  Administered 2018-04-02: 10 mL
  Filled 2018-04-02: qty 10

## 2018-04-02 NOTE — Patient Instructions (Signed)
[place] Discharge Instructions for Patients Receiving Chemotherapy  Today you received the following chemotherapy agents:  Rituxan  To help prevent nausea and vomiting after your treatment, we encourage you to take your nausea medication as prescribed.   If you develop nausea and vomiting that is not controlled by your nausea medication, call the clinic.   BELOW ARE SYMPTOMS THAT SHOULD BE REPORTED IMMEDIATELY:  *FEVER GREATER THAN 100.5 F  *CHILLS WITH OR WITHOUT FEVER  NAUSEA AND VOMITING THAT IS NOT CONTROLLED WITH YOUR NAUSEA MEDICATION  *UNUSUAL SHORTNESS OF BREATH  *UNUSUAL BRUISING OR BLEEDING  TENDERNESS IN MOUTH AND THROAT WITH OR WITHOUT PRESENCE OF ULCERS  *URINARY PROBLEMS  *BOWEL PROBLEMS  UNUSUAL RASH Items with * indicate a potential emergency and should be followed up as soon as possible.  Feel free to call the clinic should you have any questions or concerns. The clinic phone number is [phone].  Please show the [place] at check-in to the Emergency Department and triage nurse.   Pegfilgrastim injection What is this medicine? PEGFILGRASTIM (PEG fil gra stim) is a long-acting granulocyte colony-stimulating factor that stimulates the growth of neutrophils, a type of white blood cell important in the body's fight against infection. It is used to reduce the incidence of fever and infection in patients with certain types of cancer who are receiving chemotherapy that affects the bone marrow, and to increase survival after being exposed to high doses of radiation. This medicine may be used for other purposes; ask your health care provider or pharmacist if you have questions. COMMON BRAND NAME(S): Neulasta What should I tell my health care provider before I take this medicine? They need to know if you have any of these conditions: -kidney disease -latex allergy -ongoing radiation therapy -sickle cell disease -skin reactions to  acrylic adhesives (On-Body Injector only) -an unusual or allergic reaction to pegfilgrastim, filgrastim, other medicines, foods, dyes, or preservatives -pregnant or trying to get pregnant -breast-feeding How should I use this medicine? This medicine is for injection under the skin. If you get this medicine at home, you will be taught how to prepare and give the pre-filled syringe or how to use the On-body Injector. Refer to the patient Instructions for Use for detailed instructions. Use exactly as directed. Tell your healthcare provider immediately if you suspect that the On-body Injector may not have performed as intended or if you suspect the use of the On-body Injector resulted in a missed or partial dose. It is important that you put your used needles and syringes in a special sharps container. Do not put them in a trash can. If you do not have a sharps container, call your pharmacist or healthcare provider to get one. Talk to your pediatrician regarding the use of this medicine in children. While this drug may be prescribed for selected conditions, precautions do apply. Overdosage: If you think you have taken too much of this medicine contact a poison control center or emergency room at once. NOTE: This medicine is only for you. Do not share this medicine with others. What if I miss a dose? It is important not to miss your dose. Call your doctor or health care professional if you miss your dose. If you miss a dose due to an On-body Injector failure or leakage, a new dose should be administered as soon as possible using a single prefilled syringe for manual use. What may interact with this medicine? Interactions have not been studied. Give  your health care provider a list of all the medicines, herbs, non-prescription drugs, or dietary supplements you use. Also tell them if you smoke, drink alcohol, or use illegal drugs. Some items may interact with your medicine. This list may not describe all  possible interactions. Give your health care provider a list of all the medicines, herbs, non-prescription drugs, or dietary supplements you use. Also tell them if you smoke, drink alcohol, or use illegal drugs. Some items may interact with your medicine. What should I watch for while using this medicine? You may need blood work done while you are taking this medicine. If you are going to need a MRI, CT scan, or other procedure, tell your doctor that you are using this medicine (On-Body Injector only). What side effects may I notice from receiving this medicine? Side effects that you should report to your doctor or health care professional as soon as possible: -allergic reactions like skin rash, itching or hives, swelling of the face, lips, or tongue -dizziness -fever -pain, redness, or irritation at site where injected -pinpoint red spots on the skin -red or dark-brown urine -shortness of breath or breathing problems -stomach or side pain, or pain at the shoulder -swelling -tiredness -trouble passing urine or change in the amount of urine Side effects that usually do not require medical attention (report to your doctor or health care professional if they continue or are bothersome): -bone pain -muscle pain This list may not describe all possible side effects. Call your doctor for medical advice about side effects. You may report side effects to FDA at 1-800-FDA-1088. Where should I keep my medicine? Keep out of the reach of children. Store pre-filled syringes in a refrigerator between 2 and 8 degrees C (36 and 46 degrees F). Do not freeze. Keep in carton to protect from light. Throw away this medicine if it is left out of the refrigerator for more than 48 hours. Throw away any unused medicine after the expiration date. NOTE: This sheet is a summary. It may not cover all possible information. If you have questions about this medicine, talk to your doctor, pharmacist, or health care  provider.  2018 [employer]vier/Gold Standard (2016-04-21 12:58:03)

## 2018-04-02 NOTE — Progress Notes (Signed)
0935:  BP has dropped and pt has slight temp elevation.  Will maintain current rate of Rituxan at this time instead of increasing.  Will recheck BP and temp in 30 minutes  1007:  BP 96/56 temp 99.6 Pt drowsy from benadryl otherwise no symptoms. Rate increased to 265ms Will continue to monitor

## 2018-04-03 DIAGNOSIS — R070 Pain in throat: Secondary | ICD-10-CM | POA: Diagnosis not present

## 2018-04-03 DIAGNOSIS — C8599 Non-Hodgkin lymphoma, unspecified, extranodal and solid organ sites: Secondary | ICD-10-CM | POA: Insufficient documentation

## 2018-04-12 NOTE — Progress Notes (Signed)
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 male is here because of left lower extremity edema and lymphadenopathy.  The patient was seen in the emergency room this past Friday for the same issue.  A CT of the abdomen and pelvis was performed showing bulky left inguinal, left hemipelvic, and retroperitoneal adenopathy.  He was referred to [place] from the emergency room for further evaluation.  Doppler ultrasound of the left lower extremity was performed and was negative for DVT. Patient reports that he has been having left lower extremity edema in his left groin and left leg for approximately 1 month.  He states that the swelling in the left groin started to get better but then worsened.  The left lower extremity edema has slowly worsened over time.  Patient denies having fevers and chills.  He reports that he does have night sweats at times.  He reported having headaches approximate 1 [place]  He thinks his headaches are related to not having his blood pressure medication.  His headaches have now resolved.  He denies visual changes.  The patient denies chest pain, shortness of breath and cough.  No nausea, vomiting, constipation, diarrhea.  Denies abdominal pain.  The patient denies recent weight loss and has actually gained weight recently.  Patient denies epistaxis, bleeding gums, hemoptysis, hematuria, but occasionally, and melena.  He reports increased urinary frequency over the past month but no dysuria.  The patient is here for evaluation and discussion of his recent CT and lab findings.  Interval History:   Jesus [name] returns today for management and  evaluation of his newly diagnosed T-Cell/histocyte rich Large B-Cell Lymphoma. I last saw the pt on 03/31/18 with C4 EPOCH-R discharge. [name].   The pt reports that he has felt tired in the interim, but denies his hemorrhoids bleeding and denies blood in the stools. The pt also denies any CP, SOB, light headedness or dizziness.   He notes that he has not been eating well because his taste buds have been affected. He notes that he has been eating mostly fruits and Boost/Ensure. He notes that he has lost about 50 pounds since he first began feeling symptoms of his cancer. He has continued on Amlodipine and Lisinopril.   The pt notes that his finger tips and bottoms of his feet have had stable neuropathy.  The pt denies any abdominal pains or leg swelling.   The pt notes that there is some difficulty with his urination, but has continued on Flomax.   Lab results today (04/13/18) of CBC w/diff and CMP is as follows: all values are WNL except for WBC at 18.3k, RBC at 2.64, HGB at 7.7, HCT at 23.7, RDW at 16.1, PLT at 571k, ANC at 12.6k, Monocytes abs at 2.7k, Abs immature granulocytes at 0.50k, Glucose at 140, Creatinine at 1.50, Albumin at 3.1, GFR at 57.  On review of systems, pt reports not eating very well, feeling tired, altered taste, and denies blood in the stools, bleeding hemorrhoids, mouth sores, pain when swallowing, CP, SOB, dizziness, light headedness, abdominal pains, leg swelling,  and any other symptoms.   MEDICAL HISTORY:  Past Medical History:  Diagnosis Date  . Allergy   . History of kidney stones   . Hyperlipidemia   . Hypertension   . Lymphadenopathy   . Pain, lower leg    Bilateral  . Peripheral arterial disease ([place])   . Pre-diabetes   . Red-green color blindness   . Snores   . Wears glasses     SURGICAL HISTORY: Past Surgical History:  Procedure Laterality Date  . CATARACT EXTRACTION W/ INTRAOCULAR LENS  IMPLANT, BILATERAL      . COLONOSCOPY    . dislodged salava stone    . FRACTURE SURGERY    . HAND ARTHROPLASTY  1995   crushed left hand  . INGUINAL LYMPH NODE BIOPSY Left 01/02/2018   Procedure: LEFT INGUINAL LYMPH NODE BIOPSY;  Surgeon: Rolm Bookbinder, MD;  Location: [facility];  Service: General;  Laterality: Left;  . IR IMAGING GUIDED PORT INSERTION  01/15/2018  . MICROLARYNGOSCOPY Left 01/17/2014   Procedure: MICROLARYNGOSCOPY WITH EXCISION OF THE BIOPSY OF LEFT VOCAL CORD LESION;  Surgeon: Izora Gala, MD;  Location: [facility];  Service: ENT;  Laterality: Left;  . ORIF FOOT FRACTURE  2005   left    SOCIAL HISTORY: Social History   Socioeconomic History  . Marital status: Divorced    Spouse name: Not on file  . Number of children: 3  . Years of education: Not on file  . Highest education level: Not on file  Occupational History  . Not on file  Social Needs  . Financial resource strain: Not on file  . Food insecurity:    Worry: Not on file    Inability: Not on file  . Transportation needs:    Medical: Not on file    Non-medical: Not on file  Tobacco Use  . Smoking status: Current Every Day Smoker    Packs/day: 0.50    Years: 36.00    Pack years: 18.00    Types: Cigarettes  . Smokeless tobacco: Never Used  Substance and Sexual Activity  . Alcohol use: Yes    Alcohol/week: 15.0 standard drinks    Types: 10 Cans of beer, 5 Shots of liquor per week    Comment: weekends  . Drug use: Yes    Types: Cocaine    Comment: reports cocaine usage ~2X/ month; last use 12/26/17  . Sexual activity: Not on file  Lifestyle  . Physical activity:    Days per week: Not on file    Minutes per session: Not on file  . Stress: Not on file  Relationships  . Social connections:    Talks on phone: Not on file    Gets together: Not on file    Attends religious service: Not on file    Active member of club or organization: Not on file    Attends meetings of clubs or organizations: Not on file     Relationship status: Not on file  . Intimate partner violence:    Fear of current or ex partner: Not on file    Emotionally abused: Not on file    Physically abused: Not on file    Forced sexual activity: Not on file  Other Topics Concern  . Not on file  Social History Narrative  . Not on file    FAMILY HISTORY: Family History  Problem Relation Age of Onset  . Breast cancer Mother   . Diabetes Father   . Hypertension  Father   . Stroke Father   . Mental illness Sister   . Hypertension Daughter   . Mental illness Daughter   . Hypertension Brother   . Colon cancer Brother   . Breast cancer Sister     ALLERGIES:  is allergic to bee venom.  MEDICATIONS:  Current Outpatient Medications  Medication Sig Dispense Refill  . acetaminophen (TYLENOL) 500 MG tablet Take 1,000 mg by mouth every 6 (six) hours as needed for mild pain.    Marland Kitchen aspirin 81 MG tablet Take 81 mg by mouth daily.    Marland Kitchen CIALIS 20 MG tablet Take 20 mg by mouth daily as needed for erectile dysfunction.   0  . dexamethasone (DECADRON) 4 MG tablet Take 2 tablets (8 mg total) by mouth 2 (two) times daily with a meal. Take two times a day starting the day after chemotherapy for 3 days. (Patient not taking: Reported on 03/27/2018) 30 tablet 1  . hydrocortisone (ANUSOL-HC) 2.5 % rectal cream Place 1 application rectally 2 (two) times daily as needed for hemorrhoids. 30 g 0  . lisinopril (PRINIVIL,ZESTRIL) 20 MG tablet Take 10 mg by mouth daily.    Marland Kitchen LORazepam (ATIVAN) 0.5 MG tablet Take 1 tablet (0.5 mg total) by mouth every 6 (six) hours as needed (Nausea or vomiting). 30 tablet 0  . Multiple Vitamins-Minerals (MULTIVITAMIN WITH MINERALS) tablet Take 1 tablet by mouth daily.    . ondansetron (ZOFRAN) 8 MG tablet Take 1 tablet (8 mg total) by mouth every 8 (eight) hours as needed for nausea or vomiting. 20 tablet 0  . prochlorperazine (COMPAZINE) 10 MG tablet Take 1 tablet (10 mg total) by mouth every 6 (six) hours as needed  (Nausea or vomiting). 30 tablet 1  . tamsulosin (FLOMAX) 0.4 MG CAPS capsule Take 1 capsule (0.4 mg total) by mouth daily after supper. 30 capsule 2   No current facility-administered medications for this visit.     REVIEW OF SYSTEMS:    A 10+ POINT REVIEW OF SYSTEMS WAS OBTAINED including neurology, dermatology, psychiatry, cardiac, respiratory, lymph, extremities, GI, GU, Musculoskeletal, constitutional, breasts, reproductive, HEENT.  All pertinent positives are noted in the HPI.  All others are negative.   PHYSICAL EXAMINATION: ECOG PERFORMANCE STATUS: 1 - Symptomatic but completely ambulatory  Vitals:   04/13/18 1028  BP: (!) 102/47  Pulse: (!) 118  Resp: 18  Temp: 98.2 F (36.8 C)  SpO2: 97%   Filed Weights   04/13/18 1028  Weight: 257 lb 4.8 oz (116.7 kg)    GENERAL:alert, in no acute distress and comfortable SKIN: no acute rashes, no significant lesions EYES: conjunctiva are pink and non-injected, sclera anicteric OROPHARYNX: MMM, no exudates, no oropharyngeal erythema or ulceration NECK: supple, no JVD LYMPH:  no palpable lymphadenopathy in the cervical, axillary or inguinal regions LUNGS: clear to auscultation b/l with normal respiratory effort HEART: regular rate & rhythm ABDOMEN:  normoactive bowel sounds , non tender, not distended. No palpable hepatosplenomegaly.  Extremity: no pedal edema PSYCH: alert & oriented x 3 with fluent speech NEURO: no focal motor/sensory deficits   LABORATORY DATA:  I have reviewed the data as listed CBC Latest Ref Rng & Units 04/13/2018 03/31/2018 03/30/2018  WBC 4.0 - 10.5 K/uL 18.3(H) 4.8 8.1  Hemoglobin 13.0 - 17.0 g/dL 7.7(L) 9.1(L) 9.4(L)  Hematocrit 39.0 - 52.0 % 23.7(L) 28.6(L) 29.9(L)  Platelets 150 - 400 K/uL 571(H) 289 303    CMP Latest Ref Rng & Units 04/13/2018 03/31/2018 03/30/2018  Glucose  70 - 99 mg/dL 140(H) 195(H) 175(H)  BUN 8 - 23 mg/dL 9 19 15   Creatinine 0.61 - 1.24 mg/dL 1.50(H) 0.78 0.79  Sodium 135 -  145 mmol/L 140 141 140  Potassium 3.5 - 5.1 mmol/L 3.5 3.6 3.8  Chloride 98 - 111 mmol/L 98 107 108  CO2 22 - 32 mmol/L 28 28 26   Calcium 8.9 - 10.3 mg/dL 9.5 9.2 9.2  Total Protein 6.5 - 8.1 g/dL 6.9 - -  Total Bilirubin 0.3 - 1.2 mg/dL 0.8 - -  Alkaline Phos 38 - 126 U/L 87 - -  AST 15 - 41 U/L 23 - -  ALT 0 - 44 U/L 16 - -   827/19 Left Inguinal LN Bx:   12/26/17 Tissue Flow Cytometry:   12/26/17 Inguinal Core biopsy:    RADIOGRAPHIC STUDIES: I have personally reviewed the radiological images as listed and agreed with the findings in the report. Dg Fluoro Guide Lumbar Puncture  Result Date: 03/29/2018 CLINICAL DATA:  B-cell lymphoma, diagnostic lumbar puncture and intrathecal methotrexate administration EXAM: DIAGNOSTIC LUMBAR PUNCTURE ATTEMPT UNDER FLUOROSCOPIC GUIDANCE FLUOROSCOPY TIME:  Fluoroscopy Time:  1 minutes, 46 seconds Radiation Exposure Index (if provided by the fluoroscopic device): 19.2 mGy Number of Acquired Spot Images: 0 PROCEDURE: I discussed the risks (including hemorrhage, infection, headache, meningitis, and nerve damage, among others), benefits, and alternatives to fluoroscopically guided lumbar puncture with the patient. We discussed the high probability of success of the procedure. The patient understood and elected to undergo the procedure. Standard time-out was employed. Following sterile skin prep and local anesthetic administration consisting of 1 percent lidocaine, a 22 gauge spinal needle was advanced into the spinal canal at the L3-4 level. With repositionings, a small amount of CSF was observed in the needle hub. The CSF pressure seemed to be low and would not flow with the patient prone, even with the head of the table tilted up to 15 degrees. I turned the patient onto his side in order to attempt to increase the CSF flow by reducing the hydrostatic effects of the needle orientation. This was not successful in causing the CSF to flow. Lateral imaging was  obtained showing the needle in the correct position along the spinal canal. The needle was slowly withdrawn from the midline in the spinal canal without any CSF flow. At this point I considered the possibility of prominent epidural adipose tissue or some other cause for the lack of flow in addition to the low pressures. There is certainly some spinal stenosis in the lumbar spine based on prior imaging. Accordingly, I re-prepped and draped the patient and administered local anesthetic, and advanced the needle in the midline at the L2-3 level into the spinal canal. Once again, I obtained a tiny amount of CSF in the hub of the new needle, but no adequate CSF flow. The CSF pressure seemed low. We tilted the table again, and repositionings and rotations of the needle performed and confirmed to be in the appropriate location at fluoroscopy. However, sustained flow could not be obtained and accordingly I could not be confident that medication administration via the spinal needle would definitely into the thecal sac. At this point, having tried 2 adjacent levels, the attempt at lumbar puncture was stopped. The skin was cleansed and bandaged. IMPRESSION: 1. Unsuccessful fluoroscopically guided lumbar puncture. Needle positioning at the 2 levels (L2-3 and L3-4) attempted was correct based on frontal and lateral fluoroscopic images, and a small amount of CSF was noted in the  needle hubs at both levels, but actual flow of CSF through the needle could not be sustained despite turning the patient lateral, tilting the table up, and needle repositioning at each level. The lack of CSF flow meant I could be certain that the needle was in the thecal sac as opposed to in the epidural space, and accordingly the methotrexate could not be administered. 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 I  would suggest aggressive hydration prior to any repeated attempts. Based on the appearance of spinal stenosis, I would also recommend avoiding the L4-5 level, as I did. Electronically Signed   By: Van Clines M.D.   On: 03/29/2018 13:31    ASSESSMENT & PLAN:   This is a pleasant 62 y.o. 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  CT of the abdomen and pelvis performed on 12/22/2017 showed bulky left inguinal, left hemipelvic, and retroperitoneal adenopathy.   01/02/18 Left inguinal LN Biopsy revealed T-Cell/histocyte rich Large B-Cell Lymphoma  12/27/17 ECHO revealed LV EF of 55-60%   01/05/18 PET/CT revealedMassively enlarged pelvic lymph nodes intense metabolic activity consistent lymphoma. 2. Additional hypermetabolic lymph nodes in the porta hepatis and retroperitoneum LEFT aorta. 3. Solitary hypermetabolic mediastinal lymph node in the upper LEFT Mediastinum. 4. 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 5. Normal spleen.  HIV non reactive on 12/22/2017.Hep C and hep B serology negative.  03/14/18 PET/CT revealedPET-CT findings suggest an excellent response to chemotherapy. The abdominal lymphadenopathy has near completely resolved and demonstrates a near complete metabolic response. The pelvic and inguinal adenopathy has significantly decreased in size and the metabolic activity has significantly decreased. 2. Diffuse marrow activity likely due to chemotherapy and or marrow stimulating drugs. I do not see any discrete persistent lesions.   2) left lower extremity swelling- now resolved Doppler ultrasound for DVT was negative in the left lower extremity.   Likely from venous compression +/- lymphatic obstruction from bulky left inguinal, left hemipelvic, and retroperitoneal adenopathy.   3) Intermediate to high risk for tumor lysis syndrome.- no TLS noted with allopurinol prophylaxis after C1. Off allopurinol.  4) S/p Port a cath placement   5)h/oE.coli UTI - Pansensitive -Resolved.Also appearedto have BPH like symptomatology.  6) Hypokalemiaresolved with K replacement. Likely from potassium wasting from high dose steroids  PLAN: -Discussed pt labwork today, 04/13/18; HGB at 7.7, ANC at 12.6k after G-CSF support, PLT at 571k -Will order 2 units PRBCs, which the patient prefers to receive upon C5 admission on 04/16/18. No dizziness, light headedness, no SOB, no CP.  -The pt has no prohibitive toxicities from beginning C5 EPOCH-R on 04/16/18 -Encouraged the patient to do his best to eat well and consume Boost/Ensure as supplements, not replacements  -unsuccessful attempt at C4 LP with IT MTX by IR. Patients back pain and leg tingling now resolved. He prefers to wait till C5 to attempt this again. -Continue Flomax, possibly obstructive element to urination with increase in Creatinine to 1.50 without BUN increase. Also recommended staying well hydrated  7) DVT prophylaxis -lovenox (held for 2 days for LP),SCD, ambulation.  8) HTN -Continue outpatient antihypertensives and monitor -Begin cutting 83m Lisinopril in half due to weight loss -  Discontinue amlodipine due to weight loss  9) hemorrhoidal bleeding- resolved. hgb stable. Prn anusol   Orders Placed This Encounter  Procedures  . CBC with Differential/Platelet    Standing Status:   Future    Standing Expiration Date:   05/18/2019  . CMP ([facility] only)    Standing Status:   Future    Standing Expiration Date:   04/14/2019  . Magnesium    Standing Status:   Future    Standing Expiration Date:   04/13/2019  . Sample to Blood Bank    Standing Status:   Future     Standing Expiration Date:   04/14/2019    Inpatient admission on 12/9 for 5 days for C5 of [facility] Please schedule for outpatient Rituxan and Neulasta on 04/23/2018 RTC with Dr Irene Limbo with labs on 05/03/2018 with labs   All questions were answered. The patient knows to call the clinic with any problems, questions or concerns.  The total time spent in the appt was 25 minutes and more than 50% was on counseling and direct patient cares.   Sullivan Lone MD [place] AAHIVMS [facility] Hematology/Oncology Physician [facility]  (Office):       [phone] (Work cell):  [phone] (Fax):           [phone]  I, Baldwin [place], am acting as a scribe for Dr. Sullivan Lone.   .I have reviewed the above documentation for accuracy and completeness, and I agree with the above. Brunetta Genera MD

## 2018-04-13 ENCOUNTER — Inpatient Hospital Stay: Payer: 59 | Attending: Oncology

## 2018-04-13 ENCOUNTER — Inpatient Hospital Stay (HOSPITAL_BASED_OUTPATIENT_CLINIC_OR_DEPARTMENT_OTHER): Payer: 59 | Admitting: Hematology

## 2018-04-13 ENCOUNTER — Encounter: Payer: Self-pay | Admitting: Hematology

## 2018-04-13 ENCOUNTER — Telehealth: Payer: Self-pay | Admitting: *Deleted

## 2018-04-13 VITALS — BP 102/47 | HR 118 | Temp 98.2°F | Resp 18 | Ht 72.0 in | Wt 257.3 lb

## 2018-04-13 DIAGNOSIS — C8338 Diffuse large B-cell lymphoma, lymph nodes of multiple sites: Secondary | ICD-10-CM

## 2018-04-13 DIAGNOSIS — Z7289 Other problems related to lifestyle: Secondary | ICD-10-CM | POA: Diagnosis not present

## 2018-04-13 DIAGNOSIS — I10 Essential (primary) hypertension: Secondary | ICD-10-CM | POA: Diagnosis not present

## 2018-04-13 DIAGNOSIS — F17210 Nicotine dependence, cigarettes, uncomplicated: Secondary | ICD-10-CM | POA: Diagnosis not present

## 2018-04-13 DIAGNOSIS — K649 Unspecified hemorrhoids: Secondary | ICD-10-CM | POA: Insufficient documentation

## 2018-04-13 DIAGNOSIS — D500 Iron deficiency anemia secondary to blood loss (chronic): Secondary | ICD-10-CM | POA: Insufficient documentation

## 2018-04-13 LAB — CMP (CANCER CENTER ONLY)
ALT: 16 U/L (ref 0–44)
AST: 23 U/L (ref 15–41)
Albumin: 3.1 g/dL — ABNORMAL LOW (ref 3.5–5.0)
Alkaline Phosphatase: 87 U/L (ref 38–126)
Anion gap: 14 (ref 5–15)
BUN: 9 mg/dL (ref 8–23)
CO2: 28 mmol/L (ref 22–32)
Calcium: 9.5 mg/dL (ref 8.9–10.3)
Chloride: 98 mmol/L (ref 98–111)
Creatinine: 1.5 mg/dL — ABNORMAL HIGH (ref 0.61–1.24)
GFR, Est AFR Am: 57 mL/min — ABNORMAL LOW (ref >60)
GFR, Estimated: 49 mL/min — ABNORMAL LOW (ref >60)
Glucose, Bld: 140 mg/dL — ABNORMAL HIGH (ref 70–99)
Potassium: 3.5 mmol/L (ref 3.5–5.1)
Sodium: 140 mmol/L (ref 135–145)
Total Bilirubin: 0.8 mg/dL (ref 0.3–1.2)
Total Protein: 6.9 g/dL (ref 6.5–8.1)

## 2018-04-13 LAB — CBC WITH DIFFERENTIAL/PLATELET
Abs Immature Granulocytes: 0.5 10*3/uL — ABNORMAL HIGH (ref 0.00–0.07)
Basophils Absolute: 0.1 10*3/uL (ref 0.0–0.1)
Basophils Relative: 0 %
Eosinophils Absolute: 0 10*3/uL (ref 0.0–0.5)
Eosinophils Relative: 0 %
HCT: 23.7 % — ABNORMAL LOW (ref 39.0–52.0)
Hemoglobin: 7.7 g/dL — ABNORMAL LOW (ref 13.0–17.0)
Immature Granulocytes: 3 %
Lymphocytes Relative: 14 %
Lymphs Abs: 2.5 10*3/uL (ref 0.7–4.0)
MCH: 29.2 pg (ref 26.0–34.0)
MCHC: 32.5 g/dL (ref 30.0–36.0)
MCV: 89.8 fL (ref 80.0–100.0)
Monocytes Absolute: 2.7 10*3/uL — ABNORMAL HIGH (ref 0.1–1.0)
Monocytes Relative: 15 %
Neutro Abs: 12.6 10*3/uL — ABNORMAL HIGH (ref 1.7–7.7)
Neutrophils Relative %: 68 %
Platelets: 571 10*3/uL — ABNORMAL HIGH (ref 150–400)
RBC: 2.64 MIL/uL — ABNORMAL LOW (ref 4.22–5.81)
RDW: 16.1 % — ABNORMAL HIGH (ref 11.5–15.5)
WBC: 18.3 10*3/uL — ABNORMAL HIGH (ref 4.0–10.5)
nRBC: 0 % (ref 0.0–0.2)

## 2018-04-13 LAB — LACTATE DEHYDROGENASE: LDH: 213 U/L — ABNORMAL HIGH (ref 98–192)

## 2018-04-13 MED ORDER — TAMSULOSIN HCL 0.4 MG PO CAPS: 0.4000 mg | ORAL_CAPSULE | Freq: Every day | ORAL | 2 refills | Status: DC

## 2018-04-13 NOTE — Progress Notes (Signed)
Met with patient to introduce myself as Arboriculturist and offer available resources.  Discussed one-time $700 Engineer, drilling. Patient will bring proof of income to apply for the grant on Monday 04/16/18. He has my card for any additional financial questions or concerns.

## 2018-04-13 NOTE — Telephone Encounter (Signed)
Contacted bed placement. Patient information given to Shirlean Mylar regarding patient admission 12/9 top 6E for 5 days EPOCH. Email sent to #[email or web address] with patient information

## 2018-04-16 ENCOUNTER — Inpatient Hospital Stay (HOSPITAL_COMMUNITY)
Admission: AD | Admit: 2018-04-16 | Discharge: 2018-04-20 | DRG: 871 | Disposition: A | Discharge status: Home / Self Care | Payer: 59 | Source: Ambulatory Visit | Attending: Internal Medicine | Admitting: Internal Medicine

## 2018-04-16 ENCOUNTER — Inpatient Hospital Stay (HOSPITAL_COMMUNITY): Payer: 59

## 2018-04-16 ENCOUNTER — Encounter (HOSPITAL_COMMUNITY): Payer: Self-pay

## 2018-04-16 ENCOUNTER — Other Ambulatory Visit: Payer: Self-pay

## 2018-04-16 DIAGNOSIS — D6481 Anemia due to antineoplastic chemotherapy: Secondary | ICD-10-CM | POA: Diagnosis present

## 2018-04-16 DIAGNOSIS — C8336 Diffuse large B-cell lymphoma, intrapelvic lymph nodes: Secondary | ICD-10-CM | POA: Diagnosis not present

## 2018-04-16 DIAGNOSIS — Z7982 Long term (current) use of aspirin: Secondary | ICD-10-CM | POA: Diagnosis not present

## 2018-04-16 DIAGNOSIS — A4151 Sepsis due to Escherichia coli [E. coli]: Secondary | ICD-10-CM | POA: Diagnosis present

## 2018-04-16 DIAGNOSIS — I10 Essential (primary) hypertension: Secondary | ICD-10-CM | POA: Diagnosis present

## 2018-04-16 DIAGNOSIS — E785 Hyperlipidemia, unspecified: Secondary | ICD-10-CM | POA: Diagnosis present

## 2018-04-16 DIAGNOSIS — N281 Cyst of kidney, acquired: Secondary | ICD-10-CM | POA: Diagnosis present

## 2018-04-16 DIAGNOSIS — C8338 Diffuse large B-cell lymphoma, lymph nodes of multiple sites: Secondary | ICD-10-CM | POA: Diagnosis not present

## 2018-04-16 DIAGNOSIS — C7951 Secondary malignant neoplasm of bone: Secondary | ICD-10-CM | POA: Diagnosis present

## 2018-04-16 DIAGNOSIS — G9341 Metabolic encephalopathy: Secondary | ICD-10-CM | POA: Diagnosis present

## 2018-04-16 DIAGNOSIS — R945 Abnormal results of liver function studies: Secondary | ICD-10-CM | POA: Diagnosis present

## 2018-04-16 DIAGNOSIS — R740 Nonspecific elevation of levels of transaminase and lactic acid dehydrogenase [LDH]: Secondary | ICD-10-CM | POA: Diagnosis present

## 2018-04-16 DIAGNOSIS — R7303 Prediabetes: Secondary | ICD-10-CM | POA: Diagnosis not present

## 2018-04-16 DIAGNOSIS — R410 Disorientation, unspecified: Secondary | ICD-10-CM | POA: Insufficient documentation

## 2018-04-16 DIAGNOSIS — C8330 Diffuse large B-cell lymphoma, unspecified site: Secondary | ICD-10-CM | POA: Diagnosis present

## 2018-04-16 DIAGNOSIS — T451X5A Adverse effect of antineoplastic and immunosuppressive drugs, initial encounter: Secondary | ICD-10-CM | POA: Diagnosis present

## 2018-04-16 DIAGNOSIS — R339 Retention of urine, unspecified: Secondary | ICD-10-CM | POA: Diagnosis present

## 2018-04-16 DIAGNOSIS — R509 Fever, unspecified: Secondary | ICD-10-CM | POA: Diagnosis not present

## 2018-04-16 DIAGNOSIS — G629 Polyneuropathy, unspecified: Secondary | ICD-10-CM | POA: Diagnosis not present

## 2018-04-16 DIAGNOSIS — N410 Acute prostatitis: Secondary | ICD-10-CM | POA: Diagnosis not present

## 2018-04-16 DIAGNOSIS — B9620 Unspecified Escherichia coli [E. coli] as the cause of diseases classified elsewhere: Secondary | ICD-10-CM

## 2018-04-16 DIAGNOSIS — N390 Urinary tract infection, site not specified: Secondary | ICD-10-CM | POA: Diagnosis not present

## 2018-04-16 DIAGNOSIS — R0602 Shortness of breath: Secondary | ICD-10-CM

## 2018-04-16 DIAGNOSIS — R7881 Bacteremia: Secondary | ICD-10-CM | POA: Diagnosis not present

## 2018-04-16 DIAGNOSIS — E876 Hypokalemia: Secondary | ICD-10-CM | POA: Diagnosis not present

## 2018-04-16 DIAGNOSIS — F17210 Nicotine dependence, cigarettes, uncomplicated: Secondary | ICD-10-CM | POA: Diagnosis present

## 2018-04-16 DIAGNOSIS — D649 Anemia, unspecified: Secondary | ICD-10-CM

## 2018-04-16 DIAGNOSIS — N179 Acute kidney failure, unspecified: Secondary | ICD-10-CM | POA: Diagnosis present

## 2018-04-16 DIAGNOSIS — R7989 Other specified abnormal findings of blood chemistry: Secondary | ICD-10-CM | POA: Diagnosis present

## 2018-04-16 DIAGNOSIS — I739 Peripheral vascular disease, unspecified: Secondary | ICD-10-CM | POA: Diagnosis present

## 2018-04-16 DIAGNOSIS — A419 Sepsis, unspecified organism: Secondary | ICD-10-CM | POA: Diagnosis not present

## 2018-04-16 DIAGNOSIS — R350 Frequency of micturition: Secondary | ICD-10-CM | POA: Diagnosis not present

## 2018-04-16 DIAGNOSIS — Z91030 Bee allergy status: Secondary | ICD-10-CM | POA: Diagnosis not present

## 2018-04-16 DIAGNOSIS — D638 Anemia in other chronic diseases classified elsewhere: Secondary | ICD-10-CM | POA: Diagnosis present

## 2018-04-16 DIAGNOSIS — E860 Dehydration: Secondary | ICD-10-CM | POA: Diagnosis present

## 2018-04-16 DIAGNOSIS — Z79899 Other long term (current) drug therapy: Secondary | ICD-10-CM

## 2018-04-16 DIAGNOSIS — F05 Delirium due to known physiological condition: Secondary | ICD-10-CM | POA: Diagnosis not present

## 2018-04-16 DIAGNOSIS — Z95828 Presence of other vascular implants and grafts: Secondary | ICD-10-CM | POA: Diagnosis not present

## 2018-04-16 DIAGNOSIS — G2581 Restless legs syndrome: Secondary | ICD-10-CM | POA: Diagnosis present

## 2018-04-16 DIAGNOSIS — R6520 Severe sepsis without septic shock: Secondary | ICD-10-CM | POA: Diagnosis not present

## 2018-04-16 DIAGNOSIS — D72829 Elevated white blood cell count, unspecified: Secondary | ICD-10-CM | POA: Diagnosis not present

## 2018-04-16 LAB — COMPREHENSIVE METABOLIC PANEL
ALT: 86 U/L — ABNORMAL HIGH (ref 0–44)
AST: 127 U/L — ABNORMAL HIGH (ref 15–41)
Albumin: 3.3 g/dL — ABNORMAL LOW (ref 3.5–5.0)
Alkaline Phosphatase: 106 U/L (ref 38–126)
Anion gap: 13 (ref 5–15)
BUN: 24 mg/dL — ABNORMAL HIGH (ref 8–23)
CO2: 26 mmol/L (ref 22–32)
Calcium: 8.9 mg/dL (ref 8.9–10.3)
Chloride: 96 mmol/L — ABNORMAL LOW (ref 98–111)
Creatinine, Ser: 1.68 mg/dL — ABNORMAL HIGH (ref 0.61–1.24)
GFR calc Af Amer: 50 mL/min — ABNORMAL LOW (ref >60)
GFR calc non Af Amer: 43 mL/min — ABNORMAL LOW (ref >60)
Glucose, Bld: 151 mg/dL — ABNORMAL HIGH (ref 70–99)
Potassium: 3.5 mmol/L (ref 3.5–5.1)
Sodium: 135 mmol/L (ref 135–145)

## 2018-04-16 LAB — COMPREHENSIVE METABOLIC PANEL WITH GFR
Total Bilirubin: 1.1 mg/dL (ref 0.3–1.2)
Total Protein: 6.7 g/dL (ref 6.5–8.1)

## 2018-04-16 LAB — CBC
HCT: 21.1 % — ABNORMAL LOW (ref 39.0–52.0)
Hemoglobin: 6.7 g/dL — CL (ref 13.0–17.0)
MCH: 29 pg (ref 26.0–34.0)
MCHC: 31.8 g/dL (ref 30.0–36.0)
MCV: 91.3 fL (ref 80.0–100.0)
Platelets: 650 10*3/uL — ABNORMAL HIGH (ref 150–400)
RBC: 2.31 MIL/uL — ABNORMAL LOW (ref 4.22–5.81)
RDW: 16.6 % — ABNORMAL HIGH (ref 11.5–15.5)
WBC: 17.3 10*3/uL — ABNORMAL HIGH (ref 4.0–10.5)
nRBC: 0 % (ref 0.0–0.2)

## 2018-04-16 LAB — PREPARE RBC (CROSSMATCH)

## 2018-04-16 LAB — GLUCOSE, CAPILLARY: Glucose-Capillary: 135 mg/dL — ABNORMAL HIGH (ref 70–99)

## 2018-04-16 LAB — PROCALCITONIN: Procalcitonin: 2 ng/mL

## 2018-04-16 IMAGING — DX DG CHEST 1V PORT
1 series · 1 of 1 positions shown · non-contrast
Comparison: Portable exam [G0] hours compared to [DATE]

CLINICAL DATA: Shortness of breath, hypertension

EXAM:
PORTABLE CHEST 1 VIEW

[chest ap]
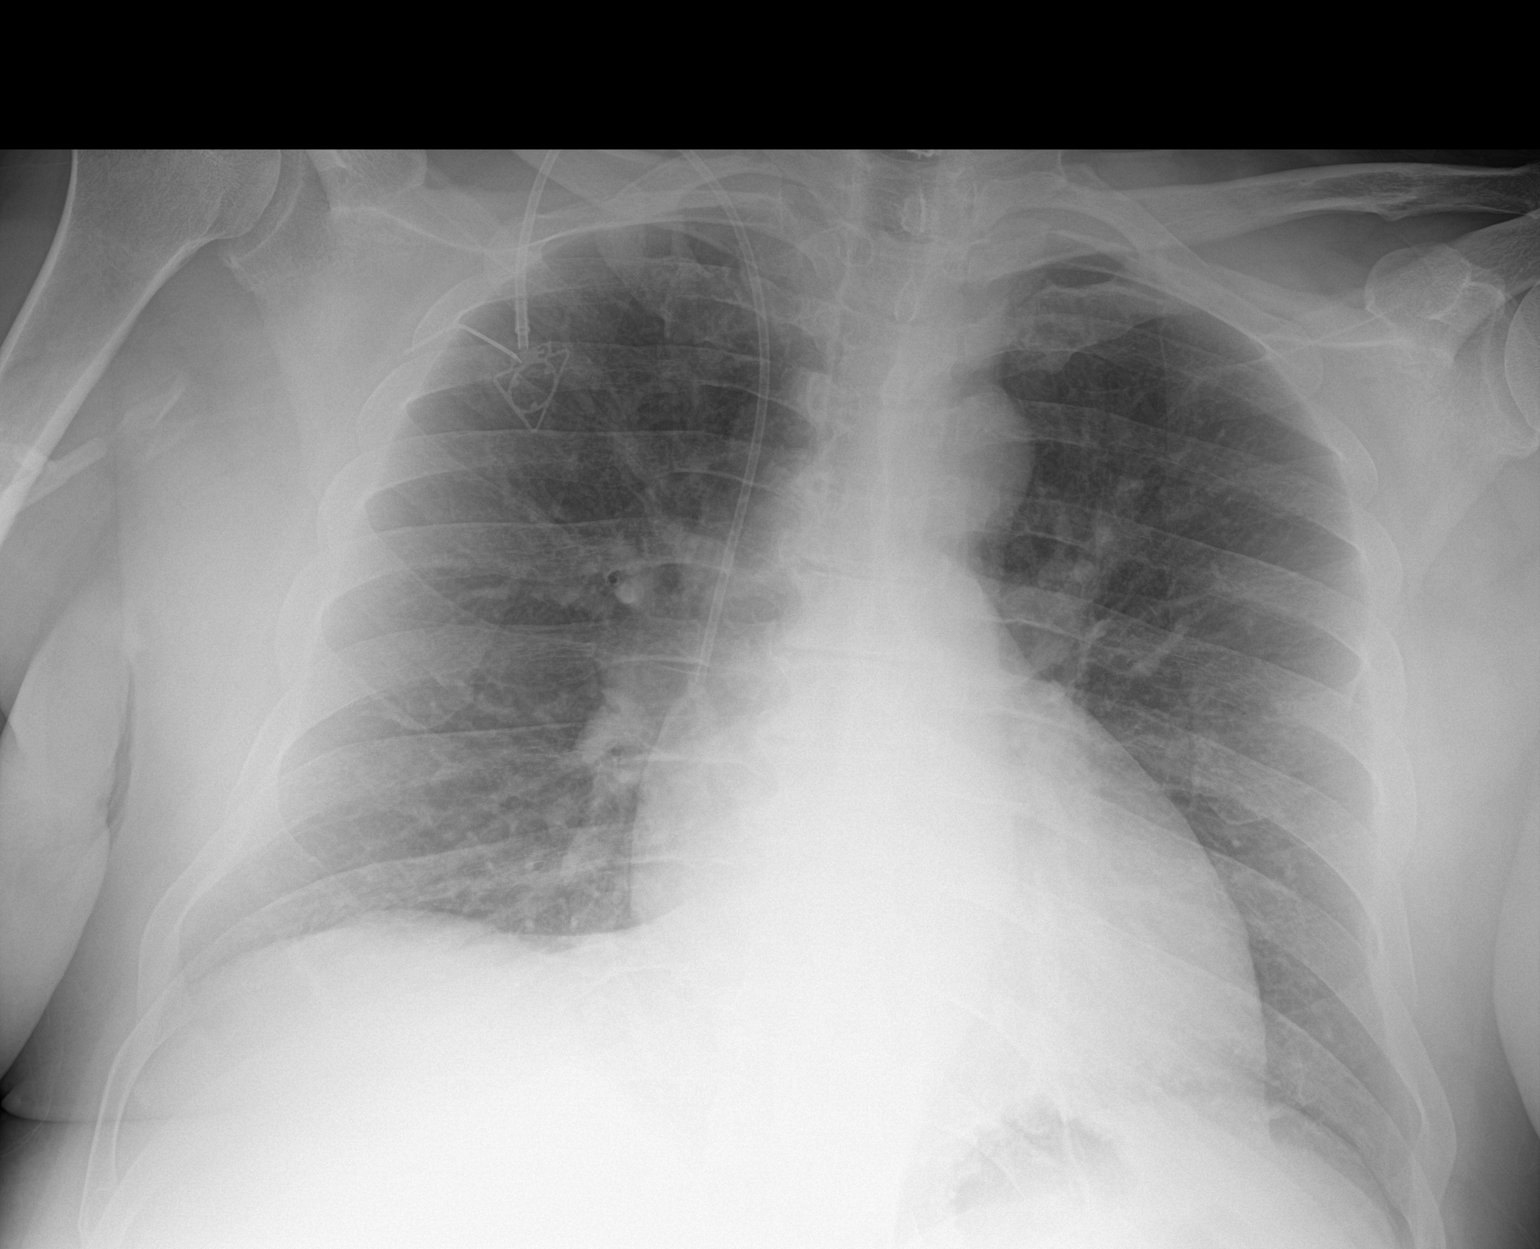

[1 of 1 positions shown; findings below may reference images not displayed]

FINDINGS: RIGHT jugular Port-A-Cath with tip projecting over SVC near
cavoatrial junction.

Upper normal size of cardiac silhouette with slight vascular
congestion.

Mediastinal contours normal.

Lungs clear.

No infiltrate, pleural effusion or pneumothorax.
IMPRESSION: No definite acute abnormalities.

## 2018-04-16 MED ORDER — ACETAMINOPHEN 325 MG PO TABS
650.0000 mg | ORAL_TABLET | Freq: Once | ORAL | Status: AC
  Administered 2018-04-16: 650 mg via ORAL
  Filled 2018-04-16: qty 2

## 2018-04-16 MED ORDER — SODIUM CHLORIDE 0.9 % IV BOLUS
1000.0000 mL | Freq: Once | INTRAVENOUS | Status: AC
  Administered 2018-04-16: 1000 mL via INTRAVENOUS

## 2018-04-16 MED ORDER — HEPARIN SOD (PORK) LOCK FLUSH 100 UNIT/ML IV SOLN
250.0000 [IU] | INTRAVENOUS | Status: DC | PRN
  Filled 2018-04-16: qty 2.5

## 2018-04-16 MED ORDER — PIPERACILLIN-TAZOBACTAM 3.375 G IVPB
3.3750 g | Freq: Three times a day (TID) | INTRAVENOUS | Status: DC
  Administered 2018-04-16: 3.375 g via INTRAVENOUS
  Filled 2018-04-16 (×2): qty 50

## 2018-04-16 MED ORDER — HEPARIN SOD (PORK) LOCK FLUSH 100 UNIT/ML IV SOLN
500.0000 [IU] | Freq: Every day | INTRAVENOUS | Status: DC | PRN
  Filled 2018-04-16: qty 5

## 2018-04-16 MED ORDER — SODIUM CHLORIDE 0.9% IV SOLUTION
250.0000 mL | Freq: Once | INTRAVENOUS | Status: AC
  Administered 2018-04-17: 250 mL via INTRAVENOUS

## 2018-04-16 MED ORDER — CHLORHEXIDINE GLUCONATE CLOTH 2 % EX PADS
6.0000 | MEDICATED_PAD | Freq: Every day | CUTANEOUS | Status: DC
  Administered 2018-04-17 – 2018-04-18 (×2): 6 via TOPICAL

## 2018-04-16 MED ORDER — ACETAMINOPHEN 500 MG PO TABS: 1000.0000 mg | ORAL_TABLET | Freq: Once | ORAL | Status: DC

## 2018-04-16 MED ORDER — SODIUM CHLORIDE 0.9% FLUSH
3.0000 mL | INTRAVENOUS | Status: AC | PRN
  Administered 2018-04-17: 3 mL

## 2018-04-16 MED ORDER — SODIUM CHLORIDE 0.9% FLUSH
10.0000 mL | Freq: Two times a day (BID) | INTRAVENOUS | Status: DC
  Administered 2018-04-17 – 2018-04-19 (×6): 10 mL

## 2018-04-16 MED ORDER — SODIUM CHLORIDE 0.9 % IV SOLN
INTRAVENOUS | Status: AC
  Administered 2018-04-16: 17:00:00 via INTRAVENOUS

## 2018-04-16 MED ORDER — VANCOMYCIN HCL IN DEXTROSE 750-5 MG/150ML-% IV SOLN
750.0000 mg | Freq: Once | INTRAVENOUS | Status: AC
  Administered 2018-04-16: 750 mg via INTRAVENOUS
  Filled 2018-04-16: qty 150

## 2018-04-16 MED ORDER — VANCOMYCIN HCL 10 G IV SOLR
1500.0000 mg | Freq: Once | INTRAVENOUS | Status: AC
  Administered 2018-04-17: 1500 mg via INTRAVENOUS
  Filled 2018-04-16: qty 1500

## 2018-04-16 MED ORDER — DIPHENHYDRAMINE HCL 25 MG PO CAPS
25.0000 mg | ORAL_CAPSULE | Freq: Once | ORAL | Status: AC
  Administered 2018-04-16: 25 mg via ORAL
  Filled 2018-04-16: qty 1

## 2018-04-16 MED ORDER — HYDROCORTISONE 2.5 % RE CREA: 1.0000 "application " | TOPICAL_CREAM | Freq: Two times a day (BID) | RECTAL | Status: DC | PRN

## 2018-04-16 MED ORDER — SODIUM CHLORIDE 0.9 % IV SOLN
INTRAVENOUS | Status: DC | PRN
  Administered 2018-04-16: 250 mL via INTRAVENOUS

## 2018-04-16 MED ORDER — MEPERIDINE HCL 25 MG/ML IJ SOLN
25.0000 mg | Freq: Once | INTRAMUSCULAR | Status: DC
  Filled 2018-04-16: qty 1

## 2018-04-16 MED ORDER — SODIUM CHLORIDE 0.9% FLUSH
10.0000 mL | INTRAVENOUS | Status: DC | PRN
  Administered 2018-04-17: 10 mL
  Filled 2018-04-16: qty 40

## 2018-04-16 MED ORDER — LORAZEPAM 0.5 MG PO TABS: 0.5000 mg | ORAL_TABLET | Freq: Four times a day (QID) | ORAL | Status: DC | PRN

## 2018-04-16 MED ORDER — ACETAMINOPHEN 500 MG PO TABS
1000.0000 mg | ORAL_TABLET | Freq: Once | ORAL | Status: AC
  Administered 2018-04-16: 1000 mg via ORAL
  Filled 2018-04-16: qty 2

## 2018-04-16 MED ORDER — LISINOPRIL 10 MG PO TABS: 10.0000 mg | ORAL_TABLET | Freq: Every day | ORAL | Status: DC

## 2018-04-16 MED ORDER — VANCOMYCIN HCL 10 G IV SOLR: 1250.0000 mg | INTRAVENOUS | Status: DC

## 2018-04-16 MED ORDER — SODIUM CHLORIDE 0.9% FLUSH: 10.0000 mL | INTRAVENOUS | Status: DC | PRN

## 2018-04-16 MED ORDER — TAMSULOSIN HCL 0.4 MG PO CAPS
0.4000 mg | ORAL_CAPSULE | Freq: Every day | ORAL | Status: DC
  Administered 2018-04-16: 0.4 mg via ORAL
  Filled 2018-04-16: qty 1

## 2018-04-16 MED ORDER — ENOXAPARIN SODIUM 40 MG/0.4ML ~~LOC~~ SOLN
40.0000 mg | Freq: Every day | SUBCUTANEOUS | Status: DC
  Administered 2018-04-16: 40 mg via SUBCUTANEOUS
  Filled 2018-04-16: qty 0.4

## 2018-04-16 MED ORDER — SODIUM CHLORIDE 0.9 % IV SOLN
INTRAVENOUS | Status: DC
  Administered 2018-04-16 – 2018-04-17 (×3): via INTRAVENOUS

## 2018-04-16 MED ORDER — PIPERACILLIN-TAZOBACTAM 3.375 G IVPB
3.3750 g | Freq: Three times a day (TID) | INTRAVENOUS | Status: DC
  Administered 2018-04-17 (×2): 3.375 g via INTRAVENOUS
  Filled 2018-04-16 (×3): qty 50

## 2018-04-16 MED ORDER — ACETAMINOPHEN 325 MG PO TABS: 650.0000 mg | ORAL_TABLET | Freq: Four times a day (QID) | ORAL | Status: DC | PRN

## 2018-04-16 NOTE — Progress Notes (Signed)
CRITICAL VALUE ALERT  Critical Value:  Hgb 6.7  Date & Time Notied:  04/16/18 @ 1157  Provider Notified: Dr. Irene Limbo  Orders Received/Actions taken: Orders placed for transfusion

## 2018-04-16 NOTE — Progress Notes (Signed)
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 male who has been admitted today for C5 EPOCH-R treatment of his T-Cell/Histocyte rich Large B-Cell Lymphoma. He is accompanied today by his daughter. [name].   At the time of visit the patient has rigors and feels warm to the touch, and reports only developing chills about 10 minutes prior to our time of visit. He denies being outside in the cold for long prior to walking into the hospital or being exposed to the cold for long periods of time over the weekend.   The pt notes that he ate "nothing" over the weekend and only had a bowl of soup this morning. The pt denies coming into contact with the individuals with obvious infections or the flu.   The pt notes that he has been urinating "somewhat well." He denies pain with urination. He notes however that his urine has been dark for a couple days.   The pt denies and abdominal pains, cough, or feeling SOB.   Lab results today (04/16/18) of CBC and CMP is as follows: all values are WNL except for WBC at 17.3, RBC at 2.31, HGB at 6.7, HCT at 21.1, RDW at 16.6, PLT at 650k, Chloride at 96, Glucose at 151, BUN at 24, Creatinine at 1.68, Albumin at 3.3, AST at 127, ALT at 86, GFR at 50.  On review of systems, pt reports blood in the urine denies painful urination, abdominal pains, cough , feeling SOB, difficulty breathing, pain at port site. Noted to have some mild confusion. Daughter at bedside.   MEDICAL HISTORY:  Past Medical History:  Diagnosis Date  . Allergy   . History of kidney stones   . Hyperlipidemia   . Hypertension   . Lymphadenopathy   . Pain, lower leg    Bilateral  . Peripheral arterial disease ([place])   . Pre-diabetes   . Red-green color blindness   . Snores   . Wears glasses     SURGICAL HISTORY: Past Surgical History:  Procedure Laterality Date  . CATARACT EXTRACTION W/ INTRAOCULAR LENS  IMPLANT, BILATERAL    . COLONOSCOPY    . dislodged salava stone    . FRACTURE SURGERY    . HAND ARTHROPLASTY  1995   crushed left hand  . INGUINAL LYMPH NODE BIOPSY Left 01/02/2018   Procedure: LEFT INGUINAL LYMPH NODE BIOPSY;  Surgeon: Rolm Bookbinder, MD;  Location: [place];  Service: General;  Laterality: Left;  . IR IMAGING GUIDED PORT INSERTION  01/15/2018  . MICROLARYNGOSCOPY Left 01/17/2014   Procedure: MICROLARYNGOSCOPY WITH EXCISION OF THE BIOPSY OF LEFT VOCAL CORD LESION;  Surgeon: Izora Gala, MD;  Location: [place];  Service: ENT;  Laterality: Left;  . ORIF FOOT FRACTURE  2005   left    SOCIAL HISTORY: Social History   Socioeconomic History  . Marital status: Divorced    Spouse name: Not on file  . Number of children: 3  . Years of education: Not on file  . Highest education level: Not on file  Occupational History  . Not on file  Social Needs  . Financial resource strain: Not on file  . Food insecurity:  Worry: Not on file    Inability: Not on file  . Transportation needs:    Medical: Not on file    Non-medical: Not on file  Tobacco Use  . Smoking status: Current Every Day Smoker    Packs/day: 0.50    Years: 36.00    Pack years: 18.00    Types: Cigarettes  . Smokeless tobacco: Never Used  Substance and Sexual Activity  . Alcohol use: Yes    Alcohol/week: 15.0 standard drinks    Types: 10 Cans of beer, 5 Shots of liquor per week    Comment: weekends  . Drug use: Yes    Types: Cocaine    Comment: reports cocaine usage ~2X/ month; last use 12/26/17  . Sexual activity: Not on file  Lifestyle  . Physical activity:    Days per week: Not on file    Minutes per session: Not on file  . Stress: Not  on file  Relationships  . Social connections:    Talks on phone: Not on file    Gets together: Not on file    Attends religious service: Not on file    Active member of club or organization: Not on file    Attends meetings of clubs or organizations: Not on file    Relationship status: Not on file  . Intimate partner violence:    Fear of current or ex partner: Not on file    Emotionally abused: Not on file    Physically abused: Not on file    Forced sexual activity: Not on file  Other Topics Concern  . Not on file  Social History Narrative  . Not on file    FAMILY HISTORY: Family History  Problem Relation Age of Onset  . Breast cancer Mother   . Diabetes Father   . Hypertension Father   . Stroke Father   . Mental illness Sister   . Hypertension Daughter   . Mental illness Daughter   . Hypertension Brother   . Colon cancer Brother   . Breast cancer Sister     ALLERGIES:  is allergic to bee venom.  MEDICATIONS:  No current facility-administered medications for this encounter.     REVIEW OF SYSTEMS:    10 Point review of Systems was done is negative except as noted above.  PHYSICAL EXAMINATION: ECOG PERFORMANCE STATUS: 1 - Symptomatic but completely ambulatory  . Vitals:   04/16/18 1434  BP: 130/79  Pulse: (!) 113  Resp: 18  Temp: 98.3 F (36.8 C)  SpO2: 98%   There were no vitals filed for this visit. .There is no height or weight on file to calculate BMI.  GENERAL:alert, rigors, febrile to touch SKIN: no acute rashes, no significant lesions EYES: conjunctiva are pink and non-injected, sclera anicteric OROPHARYNX: MMM, no exudates, no oropharyngeal erythema or ulceration NECK: supple, no JVD LYMPH:  no palpable lymphadenopathy in the cervical, axillary or inguinal regions LUNGS: clear to auscultation b/l with normal respiratory effort HEART: regular rate & rhythm ABDOMEN:  normoactive bowel sounds , non tender, not distended. Extremity: no pedal  edema PSYCH: alert & somewhat confused and fatigued appearing, NEURO: no focal motor/sensory deficits, some confused.  LABORATORY DATA:  I have reviewed the data as listed  . CBC Latest Ref Rng & Units 04/16/2018 04/13/2018 03/31/2018  WBC 4.0 - 10.5 K/uL 17.3(H) 18.3(H) 4.8  Hemoglobin 13.0 - 17.0 g/dL 6.7(LL) 7.7(L) 9.1(L)  Hematocrit 39.0 - 52.0 % 21.1(L) 23.7(L) 28.6(L)  Platelets 150 -  400 K/uL 650(H) 571(H) 289    . CMP Latest Ref Rng & Units 04/16/2018 04/13/2018 03/31/2018  Glucose 70 - 99 mg/dL 151(H) 140(H) 195(H)  BUN 8 - 23 mg/dL 24(H) 9 19  Creatinine 0.61 - 1.24 mg/dL 1.68(H) 1.50(H) 0.78  Sodium 135 - 145 mmol/L 135 140 141  Potassium 3.5 - 5.1 mmol/L 3.5 3.5 3.6  Chloride 98 - 111 mmol/L 96(L) 98 107  CO2 22 - 32 mmol/L _0 Calcium 8.9 - 10.3 mg/dL 8.9 9.5 9.2  Total Protein 6.5 - 8.1 g/dL 6.7 6.9 -  Total Bilirubin 0.3 - 1.2 mg/dL 1.1 0.8 -  Alkaline Phos 38 - 126 U/L 106 87 -  AST 15 - 41 U/L 127(H) 23 -  ALT 0 - 44 U/L 86(H) 16 -   Component     Latest Ref Rng & Units 04/16/2018  Procalcitonin     ng/mL 2.00    RADIOGRAPHIC STUDIES: I have personally reviewed the radiological images as listed and agreed with the findings in the report. Dg Chest Port 1 View  Result Date: 04/16/2018 CLINICAL DATA:  Shortness of breath, hypertension EXAM: PORTABLE CHEST 1 VIEW COMPARISON:  Portable exam 1620 hours compared to 04/04/2006 FINDINGS: RIGHT jugular Port-A-Cath with tip projecting over SVC near cavoatrial junction. Upper normal size of cardiac silhouette with slight vascular congestion. Mediastinal contours normal. Lungs clear. No infiltrate, pleural effusion or pneumothorax. IMPRESSION: No definite acute abnormalities. Electronically Signed   By: Lavonia Dana M.D.   On: 04/16/2018 17:01   Dg Fluoro Guide Lumbar Puncture  Result Date: 03/29/2018 CLINICAL DATA:  B-cell lymphoma, diagnostic lumbar puncture and intrathecal methotrexate administration EXAM:  DIAGNOSTIC LUMBAR PUNCTURE ATTEMPT UNDER FLUOROSCOPIC GUIDANCE FLUOROSCOPY TIME:  Fluoroscopy Time:  1 minutes, 46 seconds Radiation Exposure Index (if provided by the fluoroscopic device): 19.2 mGy Number of Acquired Spot Images: 0 PROCEDURE: I discussed the risks (including hemorrhage, infection, headache, meningitis, and nerve damage, among others), benefits, and alternatives to fluoroscopically guided lumbar puncture with the patient. We discussed the high probability of success of the procedure. The patient understood and elected to undergo the procedure. Standard time-out was employed. Following sterile skin prep and local anesthetic administration consisting of 1 percent lidocaine, a 22 gauge spinal needle was advanced into the spinal canal at the L3-4 level. With repositionings, a small amount of CSF was observed in the needle hub. The CSF pressure seemed to be low and would not flow with the patient prone, even with the head of the table tilted up to 15 degrees. I turned the patient onto his side in order to attempt to increase the CSF flow by reducing the hydrostatic effects of the needle orientation. This was not successful in causing the CSF to flow. Lateral imaging was obtained showing the needle in the correct position along the spinal canal. The needle was slowly withdrawn from the midline in the spinal canal without any CSF flow. At this point I considered the possibility of prominent epidural adipose tissue or some other cause for the lack of flow in addition to the low pressures. There is certainly some spinal stenosis in the lumbar spine based on prior imaging. Accordingly, I re-prepped and draped the patient and administered local anesthetic, and advanced the needle in the midline at the L2-3 level into the spinal canal. Once again, I obtained a tiny amount of CSF in the hub of the new needle, but no adequate CSF flow. The CSF pressure seemed low. We tilted  the table again, and repositionings and  rotations of the needle performed and confirmed to be in the appropriate location at fluoroscopy. However, sustained flow could not be obtained and accordingly I could not be confident that medication administration via the spinal needle would definitely into the thecal sac. At this point, having tried 2 adjacent levels, the attempt at lumbar puncture was stopped. The skin was cleansed and bandaged. IMPRESSION: 1. Unsuccessful fluoroscopically guided lumbar puncture. Needle positioning at the 2 levels (L2-3 and L3-4) attempted was correct based on frontal and lateral fluoroscopic images, and a small amount of CSF was noted in the needle hubs at both levels, but actual flow of CSF through the needle could not be sustained despite turning the patient lateral, tilting the table up, and needle repositioning at each level. The lack of CSF flow meant I could be certain that the needle was in the thecal sac as opposed to in the epidural space, and accordingly the methotrexate could not be administered. 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 I would suggest aggressive hydration prior to any repeated attempts. Based on the appearance of spinal stenosis, I would also recommend avoiding the L4-5 level, as I did. Electronically Signed   By: Van Clines M.D.   On: 03/29/2018 13:31    ASSESSMENT & PLAN:  62 y.o. 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  CT of the abdomen and pelvis performed on 12/22/2017 showed bulky left inguinal, left hemipelvic, and retroperitoneal adenopathy.   01/02/18 Left inguinal LN Biopsy revealed T-Cell/histocyte rich Large B-Cell  Lymphoma  12/27/17 ECHO revealed LV EF of 55-60%   01/05/18 PET/CT revealedMassively enlarged pelvic lymph nodes intense metabolic activity consistent lymphoma. 2. Additional hypermetabolic lymph nodes in the porta hepatis and retroperitoneum LEFT aorta. 3. Solitary hypermetabolic mediastinal lymph node in the upper LEFT Mediastinum. 4. 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 5. Normal spleen.  HIV non reactive on 12/22/2017.Hep C and hep B serology negative.  03/14/18 PET/CT revealedPET-CT findings suggest an excellent response to chemotherapy. The abdominal lymphadenopathy has near completely resolved and demonstrates a near complete metabolic response. The pelvic and inguinal adenopathy has significantly decreased in size and the metabolic activity has significantly decreased. 2. Diffuse marrow activity likely due to chemotherapy and or marrow stimulating drugs. I do not see any discrete persistent lesions.   2) left lower extremity swelling- now resolved Doppler ultrasound for DVT was negative in the left lower extremity.  Likely from venous compression +/- lymphatic obstruction from bulky left inguinal, left hemipelvic, and retroperitoneal adenopathy.   3) Intermediate to high risk for tumor lysis syndrome.- no TLS noted with allopurinol prophylaxis after C1. Off allopurinol.  4) S/p Port a cath placement   5)h/oE.coli UTI - Pansensitive -Resolved.Also appearedto have BPH like symptomatology.  6) Fever/chills - concerns for sepsis ? Source Elevated procalcitonin at 2 -Concern for urinary source -CXR - NAI No port a cath site erythema /redness.  7) Symptomatic Anemia Hgb 6.7 - due to chemotherapy. No overt evidence of bleeding.  8) Dehydration - AKI creatinine 1.68. Poor po intake. Cannot r/o urinary obstruction from BPH  9) Elevated transaminases nl ALK PO4 and bilirubin - likely from sepsis. PLAN: -hold  chemotherapy consideration at  this time. -septic w/u with pan-culture -empiric broad spectrum antibiotics zosyn + vancomycin  -IVF and fluid resuscitation -monitor mental status, will consider CT head if mental status not clearing quickly. -hold anti HTN -foley catheter for IO monitor and r/o urinary obstruction -transfuse 2 units of PRBCs -critical care consultation -patient seen on inpatient service multiple times and will be transferred to step down unit for closer monitoring  7) DVT prophylaxis -lovenox  8) HTN -hold anti HTN    All of the patients questions were answered with apparent satisfaction. The patient knows to call the clinic with any problems, questions or concerns.  The total time spent in the appt was 80 minutes and more than 50% was on counseling and direct patient cares.    Sullivan Lone MD MS AAHIVMS [facility] [place] Hematology/Oncology Physician [facility]  (Office):       [phone] (Work cell):  [phone] (Fax):           [phone]  04/16/2018 2:47 PM  I, Baldwin [place], am acting as a scribe for Dr. Sullivan Lone.   .I have reviewed the above documentation for accuracy and completeness, and I agree with the above. Sullivan Lone MD MS

## 2018-04-16 NOTE — Progress Notes (Signed)
Pharmacy Antibiotic Note  Jesus [name] is a 62 y.o. male admitted on 04/16/2018 for Cycle 5 of EPOCH-R.  Reported fever/chills/mild confusion.  PMH significant for large B-cell lymphoma.  Pharmacy has been consulted for Vancomycin dosing for sepsis.  Plan:  Zosyn 3.375g IV q8h (4 hour infusion).   Vancomycin 746m IV x 1 dose ordered by MD (given @ 23:19).  Will give additional Vancomycin 15039mIV x 1 dose immediately following the end of the 75029mose for a total loading dose of 2250m12m0 mg/kg) ==> then will continue with Vancomycin 1250mg39mq24h (Goal AUC 400-500)  Follow SCr daily while on both Zosyn and Vancomycin  Follow culture results and sensitivies  Monitor Vancomycin levels as appropriate  Height: 6' (182.9 cm) Weight: 245 lb 11.2 oz (111.4 kg) IBW/kg (Calculated) : 77.6  Temp (24hrs), Avg:98.6 F (37 C), Min:97.9 F (36.6 C), Max:99.9 F (37.7 C)  Recent Labs  Lab 04/13/18 0957 04/16/18 1533  WBC 18.3* 17.3*  CREATININE 1.50* 1.68*    Estimated Creatinine Clearance: 58.7 mL/min (A) (by C-G formula based on SCr of 1.68 mg/dL (H)).    Allergies  Allergen Reactions  . Bee Venom Anaphylaxis    Antimicrobials this admission: 12/9 Zosyn >>   12/9 Vanc >>    Dose adjustments this admission:    Microbiology results: 12/9 BCx: sent 12/9 UCx: sent   Thank you for allowing pharmacy to be a part of this patient's care.  PoindEverette RankrmD 04/16/2018 11:44 PM

## 2018-04-16 NOTE — Progress Notes (Signed)
Oncology short note  Called by RN about patient being more confused and with softer BP. Rapid response called and patient transferred to step down unit. -recommended -addittional 1 L NS fluid bolus -foleys cath for IO monitoring and addressing possible urinary obstruction and to obtain urine for testing and culture -PRBC transfusion x 2 -continue broad spectrum antibiotics. -called and requested stat hospitalist consultation to help with inpatient acute medical management of the patient and consider taking the patient over as primary service. Patient will be seen by Dr Blaine Hamper. -if persistent AMS - will need CT head. -appreciate hospitalist team cares.  Sullivan Lone MD MS

## 2018-04-17 ENCOUNTER — Inpatient Hospital Stay (HOSPITAL_COMMUNITY): Payer: 59

## 2018-04-17 DIAGNOSIS — R7989 Other specified abnormal findings of blood chemistry: Secondary | ICD-10-CM | POA: Diagnosis present

## 2018-04-17 DIAGNOSIS — A4151 Sepsis due to Escherichia coli [E. coli]: Principal | ICD-10-CM

## 2018-04-17 DIAGNOSIS — G9341 Metabolic encephalopathy: Secondary | ICD-10-CM

## 2018-04-17 DIAGNOSIS — I10 Essential (primary) hypertension: Secondary | ICD-10-CM | POA: Diagnosis present

## 2018-04-17 DIAGNOSIS — R6520 Severe sepsis without septic shock: Secondary | ICD-10-CM

## 2018-04-17 DIAGNOSIS — G2581 Restless legs syndrome: Secondary | ICD-10-CM | POA: Diagnosis present

## 2018-04-17 DIAGNOSIS — N179 Acute kidney failure, unspecified: Secondary | ICD-10-CM

## 2018-04-17 DIAGNOSIS — C8330 Diffuse large B-cell lymphoma, unspecified site: Secondary | ICD-10-CM

## 2018-04-17 DIAGNOSIS — R945 Abnormal results of liver function studies: Secondary | ICD-10-CM

## 2018-04-17 LAB — COMPREHENSIVE METABOLIC PANEL
ALT: 78 U/L — ABNORMAL HIGH (ref 0–44)
AST: 107 U/L — ABNORMAL HIGH (ref 15–41)
Alkaline Phosphatase: 112 U/L (ref 38–126)
Anion gap: 12 (ref 5–15)
BUN: 24 mg/dL — ABNORMAL HIGH (ref 8–23)
CO2: 22 mmol/L (ref 22–32)
Calcium: 7.9 mg/dL — ABNORMAL LOW (ref 8.9–10.3)
Chloride: 102 mmol/L (ref 98–111)
Creatinine, Ser: 1.92 mg/dL — ABNORMAL HIGH (ref 0.61–1.24)
GFR calc Af Amer: 42 mL/min — ABNORMAL LOW (ref >60)
GFR calc non Af Amer: 36 mL/min — ABNORMAL LOW (ref >60)
Potassium: 4.2 mmol/L (ref 3.5–5.1)
Sodium: 136 mmol/L (ref 135–145)

## 2018-04-17 LAB — RAPID URINE DRUG SCREEN, HOSP PERFORMED
Amphetamines: NOT DETECTED
Barbiturates: NOT DETECTED
Benzodiazepines: NOT DETECTED
Cocaine: NOT DETECTED
Opiates: NOT DETECTED
Tetrahydrocannabinol: NOT DETECTED

## 2018-04-17 LAB — BLOOD CULTURE ID PANEL (REFLEXED)

## 2018-04-17 LAB — URINALYSIS, ROUTINE W REFLEX MICROSCOPIC
Bilirubin Urine: NEGATIVE
Glucose, UA: NEGATIVE mg/dL
Ketones, ur: NEGATIVE mg/dL
Nitrite: NEGATIVE
Protein, ur: 100 mg/dL — AB
Specific Gravity, Urine: 1.014 (ref 1.005–1.030)
pH: 5 (ref 5.0–8.0)

## 2018-04-17 LAB — CBC WITH DIFFERENTIAL/PLATELET
Abs Immature Granulocytes: 1.53 10*3/uL — ABNORMAL HIGH (ref 0.00–0.07)
Basophils Absolute: 0.1 10*3/uL (ref 0.0–0.1)
Basophils Relative: 0 %
Eosinophils Absolute: 0 10*3/uL (ref 0.0–0.5)
Eosinophils Relative: 0 %
HCT: 24.4 % — ABNORMAL LOW (ref 39.0–52.0)
Hemoglobin: 7.9 g/dL — ABNORMAL LOW (ref 13.0–17.0)
Immature Granulocytes: 4 %
Lymphocytes Relative: 2 %
Lymphs Abs: 0.7 10*3/uL (ref 0.7–4.0)
MCH: 29.9 pg (ref 26.0–34.0)
MCHC: 32.4 g/dL (ref 30.0–36.0)
MCV: 92.4 fL (ref 80.0–100.0)
Monocytes Absolute: 0.8 10*3/uL (ref 0.1–1.0)
Monocytes Relative: 2 %
Neutro Abs: 32.6 10*3/uL — ABNORMAL HIGH (ref 1.7–7.7)
Neutrophils Relative %: 92 %
Platelets: 469 10*3/uL — ABNORMAL HIGH (ref 150–400)
RBC: 2.64 MIL/uL — ABNORMAL LOW (ref 4.22–5.81)
RDW: 15.8 % — ABNORMAL HIGH (ref 11.5–15.5)
WBC: 35.7 10*3/uL — ABNORMAL HIGH (ref 4.0–10.5)
nRBC: 0 % (ref 0.0–0.2)

## 2018-04-17 LAB — ACETAMINOPHEN LEVEL: Acetaminophen (Tylenol), Serum: <10 ug/mL — ABNORMAL LOW (ref 10–30)

## 2018-04-17 LAB — MRSA PCR SCREENING: MRSA by PCR: NEGATIVE

## 2018-04-17 LAB — COMPREHENSIVE METABOLIC PANEL WITH GFR
Albumin: 2.6 g/dL — ABNORMAL LOW (ref 3.5–5.0)
Glucose, Bld: 213 mg/dL — ABNORMAL HIGH (ref 70–99)
Total Bilirubin: 1.3 mg/dL — ABNORMAL HIGH (ref 0.3–1.2)
Total Protein: 5.9 g/dL — ABNORMAL LOW (ref 6.5–8.1)

## 2018-04-17 LAB — LACTIC ACID, PLASMA: Lactic Acid, Venous: 1 mmol/L (ref 0.5–1.9)

## 2018-04-17 LAB — AMMONIA: Ammonia: 9 umol/L (ref 9–35)

## 2018-04-17 LAB — PROCALCITONIN: Procalcitonin: 19.25 ng/mL

## 2018-04-17 LAB — PROTIME-INR
INR: 1.35
Prothrombin Time: 16.5 s — ABNORMAL HIGH (ref 11.4–15.2)

## 2018-04-17 LAB — CORTISOL-AM, BLOOD: Cortisol - AM: 53.2 ug/dL — ABNORMAL HIGH (ref 6.7–22.6)

## 2018-04-17 IMAGING — CT CT HEAD W/O CM
3 series · 15 of 47 positions shown, 18 images · non-contrast
Comparison: None.

CLINICAL DATA: Altered mental status, fever. History of lymphoma,
hypertension and hyperlipidemia.

EXAM:
CT HEAD WITHOUT CONTRAST
TECHNIQUE: Contiguous axial images were obtained from the base of the skull
through the vertex without intravenous contrast.

[Series 2: head wo · axial · 0.50mm/px · z∈[+1479,+1619]mm · 9 of 34 slices shown, 12 images]
[im 3/34  brain]
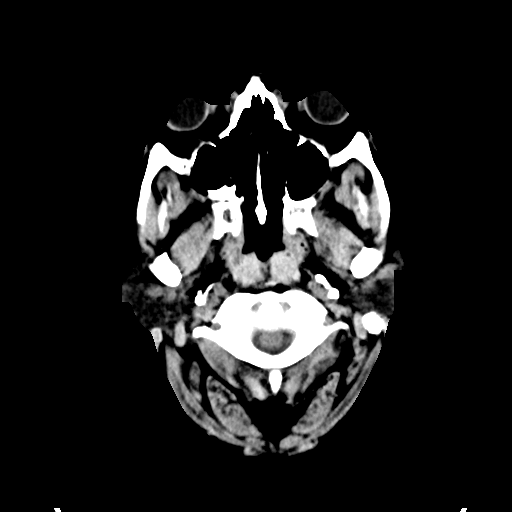
[im 3/34  bone]
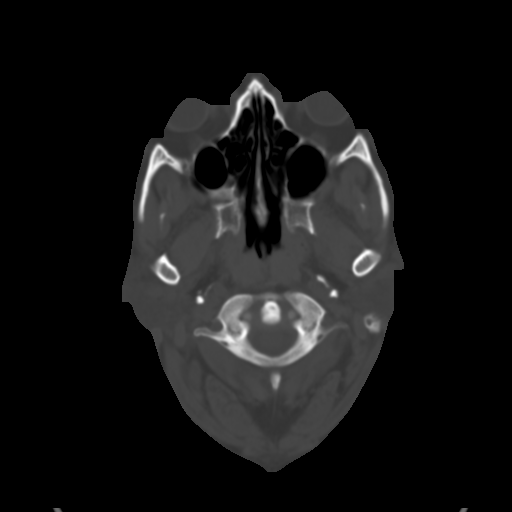
[im 6/34  brain]
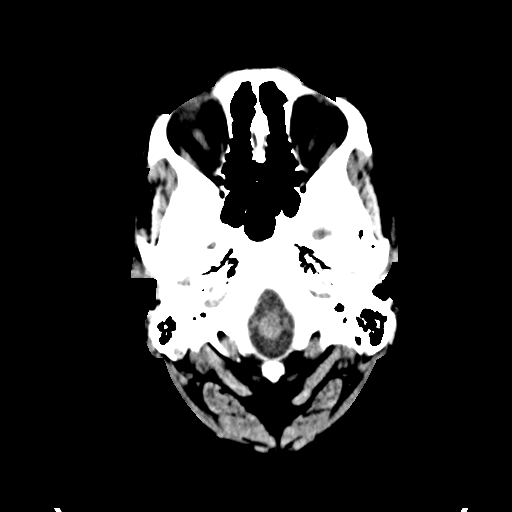
[im 10/34  brain]
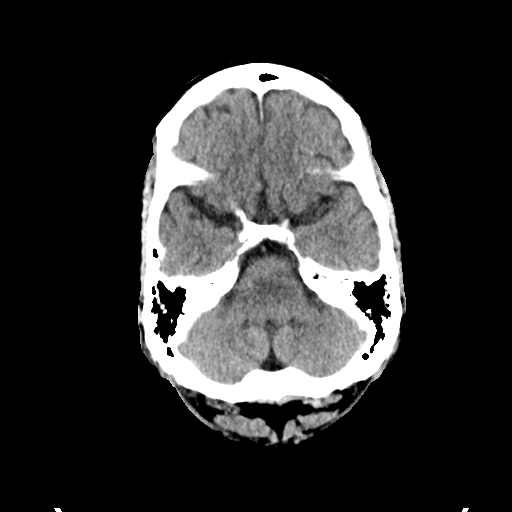
[im 13/34  brain]
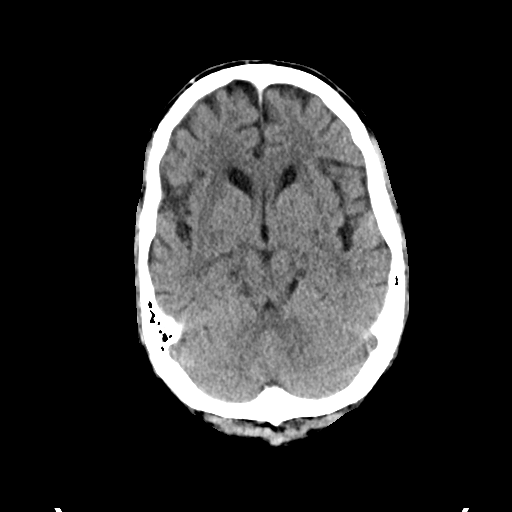
[im 18/34  brain]
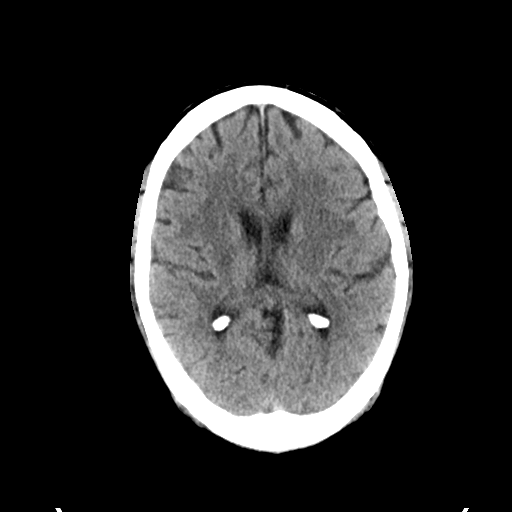
[im 18/34  bone]
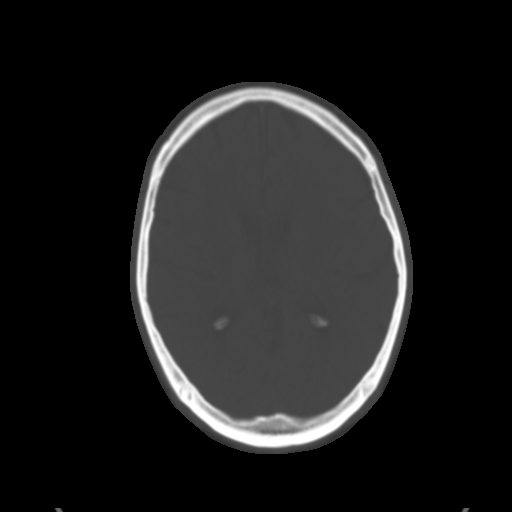
[im 21/34  brain]
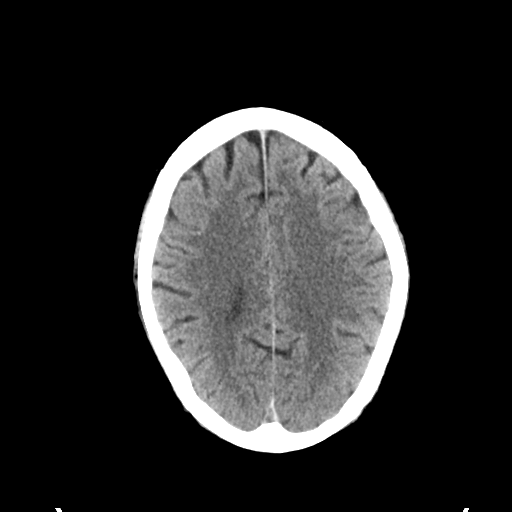
[im 24/34  brain]
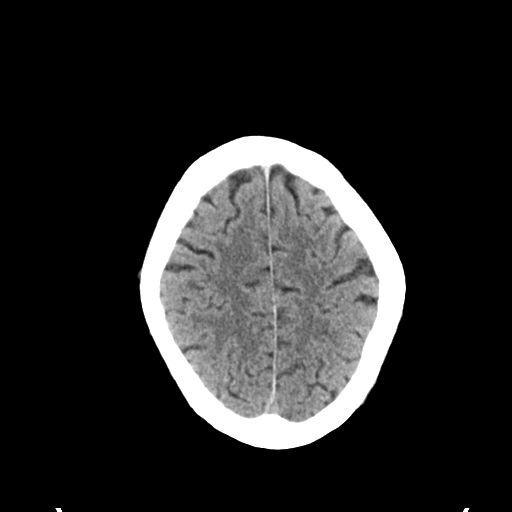
[im 28/34  brain]
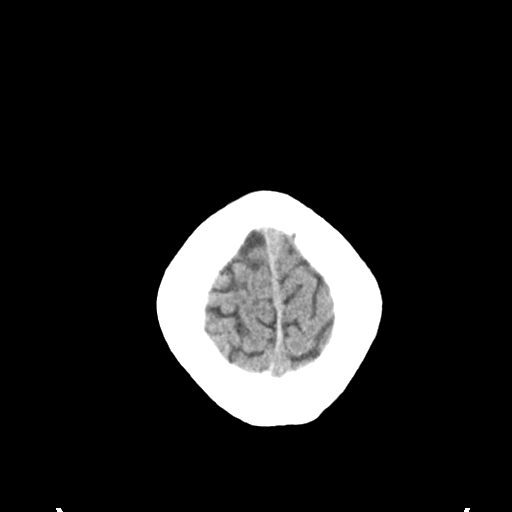
[im 31/34  brain]
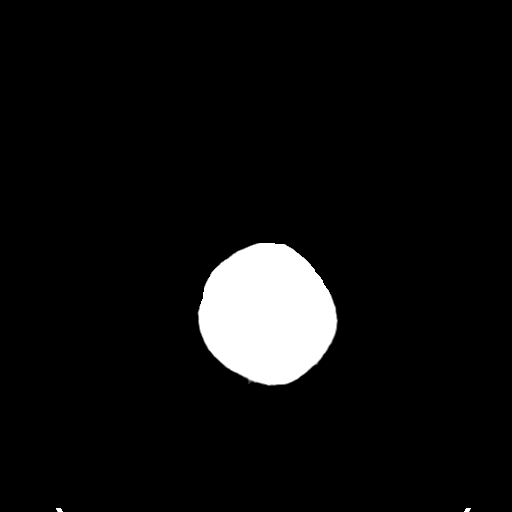
[im 31/34  bone]
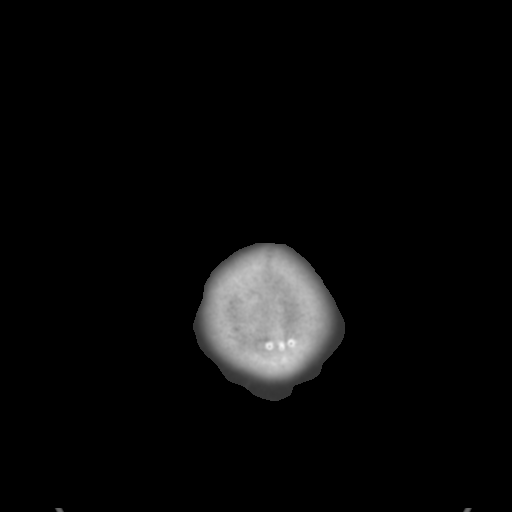

[Series 4: coronal soft tissue · coronal · 0.34mm/px · 3 of 73 slices shown]
[im 25/73  brain]
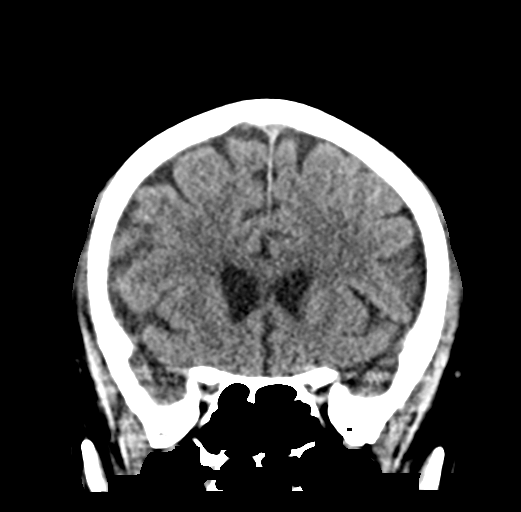
[im 33/73  brain]
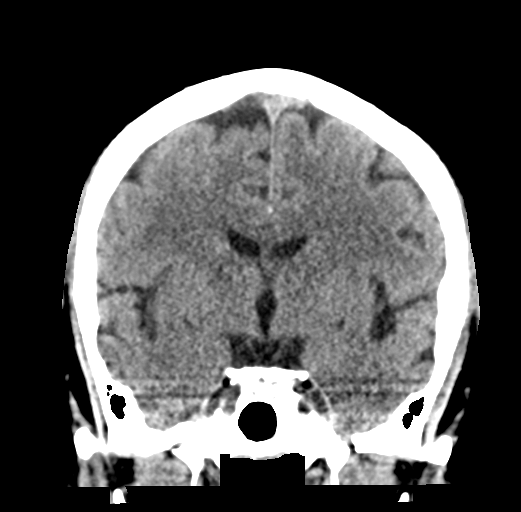
[im 41/73  brain]
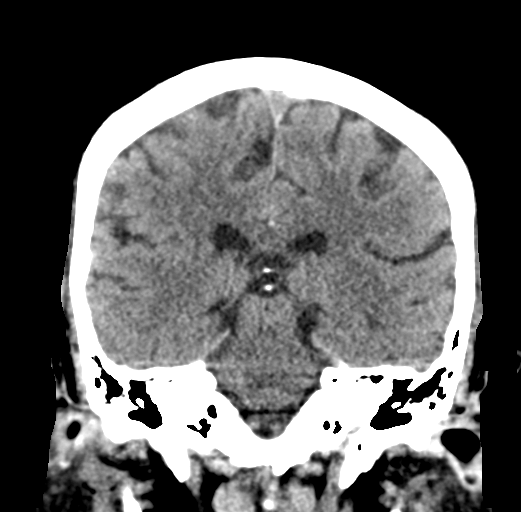

[Series 5: sagittal soft tissue · sagittal · 0.35mm/px · 3 of 59 slices shown]
[im 20/59  brain]
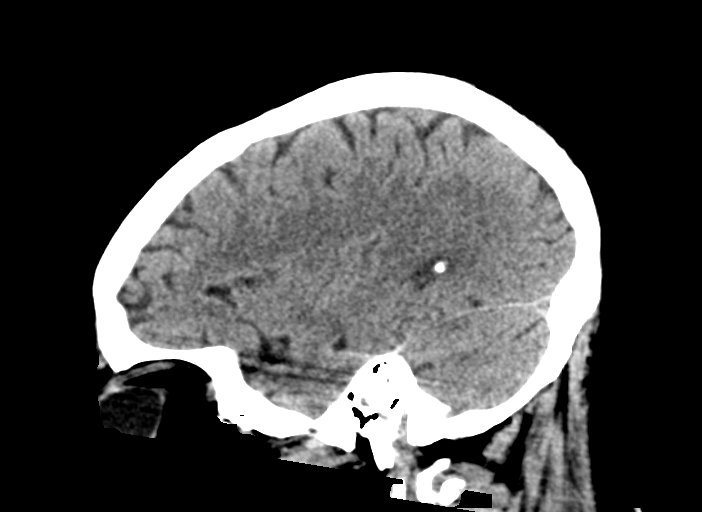
[im 30/59  brain]
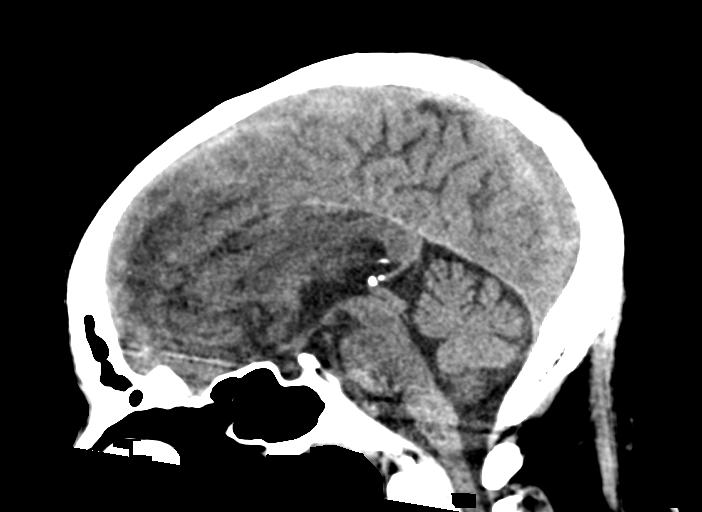
[im 39/59  brain]
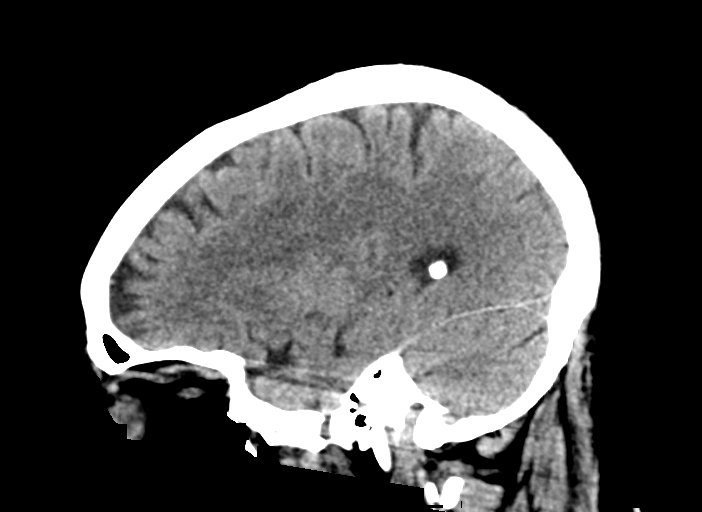

[15 of 47 positions shown; findings below may reference images not displayed]

FINDINGS: BRAIN: No intraparenchymal hemorrhage, mass effect nor midline
shift. Subtle bilateral mesial caudothalamic hypodensities. The
ventricles and sulci are normal for age. Patchy supratentorial white
matter hypodensities. No acute large vascular territory infarcts. No
abnormal extra-axial fluid collections. Basal cisterns are patent.

VASCULAR: Unremarkable.

SKULL: No skull fracture. No significant scalp soft tissue swelling.

SINUSES/ORBITS: Trace paranasal sinus mucosal thickening. Mastoid
air cells are well aerated.The included ocular globes and orbital
contents are non-suspicious. Status post bilateral ocular lens
implants.

OTHER: None.
IMPRESSION: 1. Subtle mesial caudothalamic hypodensities may be artifact though,
the could reflect encephalitis or Wernicke's encephalopathy.
Consider MRI of the head with and without contrast.
2. Mild chronic small vessel ischemic changes.

## 2018-04-17 MED ORDER — SODIUM CHLORIDE 0.9 % IV SOLN
2.0000 g | INTRAVENOUS | Status: DC
  Administered 2018-04-17 – 2018-04-20 (×4): 2 g via INTRAVENOUS
  Filled 2018-04-17: qty 20
  Filled 2018-04-17 (×3): qty 2

## 2018-04-17 MED ORDER — HYDRALAZINE HCL 20 MG/ML IJ SOLN: 5.0000 mg | INTRAMUSCULAR | Status: DC | PRN

## 2018-04-17 MED ORDER — ACETAMINOPHEN 325 MG PO TABS: 650.0000 mg | ORAL_TABLET | Freq: Four times a day (QID) | ORAL | Status: DC | PRN

## 2018-04-17 MED ORDER — ONDANSETRON HCL 4 MG/2ML IJ SOLN: 4.0000 mg | Freq: Four times a day (QID) | INTRAMUSCULAR | Status: DC | PRN

## 2018-04-17 MED ORDER — PRAMIPEXOLE DIHYDROCHLORIDE 0.25 MG PO TABS
0.2500 mg | ORAL_TABLET | Freq: Every evening | ORAL | Status: DC | PRN
  Filled 2018-04-17: qty 1

## 2018-04-17 MED ORDER — HYDROXYZINE HCL 50 MG/ML IM SOLN
25.0000 mg | Freq: Four times a day (QID) | INTRAMUSCULAR | Status: DC | PRN
  Filled 2018-04-17: qty 0.5

## 2018-04-17 MED ORDER — SODIUM CHLORIDE 0.9 % IV BOLUS: 1000.0000 mL | Freq: Once | INTRAVENOUS | Status: AC

## 2018-04-17 MED ORDER — HYDROCORTISONE NA SUCCINATE PF 100 MG IJ SOLR
100.0000 mg | Freq: Once | INTRAMUSCULAR | Status: AC
  Administered 2018-04-17: 100 mg via INTRAVENOUS
  Filled 2018-04-17: qty 2

## 2018-04-17 MED ORDER — OXYCODONE HCL 5 MG PO TABS
5.0000 mg | ORAL_TABLET | Freq: Four times a day (QID) | ORAL | Status: DC | PRN
  Administered 2018-04-17: 5 mg via ORAL
  Filled 2018-04-17: qty 1

## 2018-04-17 NOTE — H&P (Addendum)
History and Physical    [name] HQI:[id number] DOB: 10-Jan-1956 DOA: 04/16/2018  Referring MD/NP/PA:   PCP: Shirline Frees, MD   Patient coming from:  The patient is coming from home.  At baseline, pt is independent for most of ADL.        Chief Complaint: AMS and fever  HPI: Jesus [name] is a 62 y.o. 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  Pt was initially admitted by Dr. Irene Limbo of Oncology for elective chemotherapy, but was found to have AMS and fever, suspecting sepsis. Dr. Irene Limbo called [place] for further evaluation treatment. When I saw pt on he floor, pt is mildly confused, but is oriented x 3. No facial droop, slurred speech, unilateral weakness. No neck rigidity. Patient denies cough, shortness of breath, chest pain.  No nausea vomiting, diarrhea or abdominal pain.  Denies symptoms of UTI. Per his wife, patient has decreased oral intake and poor appetite recently.   Pt was found to have WBC 17.3, procalcitonin 2.00, pending lactic acid, abnormal liver function (AST 127, ALT 86, total bilirubin 1.1, ALP 106), hemoglobin 6.7 which was 7.7 on 04/13/2018, AKI with cre 1.68 and BUN 24, temperature 99.9, hypotension with blood pressure 88/38, heart rate 90s, RR 24, oxygen satting 95% on room air.  Chest x-ray negative.  Patient is admitted to stepdown as inpatient.  Review of Systems:   General: has fevers, no chills, no body weight gain, has poor appetite, has fatigue HEENT: no blurry vision, hearing changes or sore throat Respiratory: no dyspnea, coughing, wheezing CV: no chest pain, no palpitations GI: no nausea, vomiting, abdominal pain, diarrhea, constipation GU: no dysuria, burning on urination, increased urinary frequency, hematuria  Ext: no leg edema Neuro: no unilateral weakness, numbness, or tingling, no vision  change or hearing loss. Has AMS. Skin: no rash, no skin tear. MSK: No muscle spasm, no deformity, no limitation of range of movement in spin Heme: No easy bruising.  Travel history: No recent long distant travel.  Allergy:  Allergies  Allergen Reactions  . Bee Venom Anaphylaxis    Past Medical History:  Diagnosis Date  . Allergy   . History of kidney stones   . Hyperlipidemia   . Hypertension   . Lymphadenopathy   . Pain, lower leg    Bilateral  . Peripheral arterial disease ([place])   . Pre-diabetes   . Red-green color blindness   . Snores   . Wears glasses     Past Surgical History:  Procedure Laterality Date  . CATARACT EXTRACTION W/ INTRAOCULAR LENS  IMPLANT, BILATERAL    . COLONOSCOPY    . dislodged salava stone    . FRACTURE SURGERY    . HAND ARTHROPLASTY  1995   crushed left hand  . INGUINAL LYMPH NODE BIOPSY Left 01/02/2018   Procedure: LEFT INGUINAL LYMPH NODE BIOPSY;  Surgeon: Rolm Bookbinder, MD;  Location: [facility];  Service: General;  Laterality: Left;  . IR IMAGING GUIDED PORT INSERTION  01/15/2018  . MICROLARYNGOSCOPY Left 01/17/2014   Procedure: MICROLARYNGOSCOPY WITH EXCISION OF THE BIOPSY OF LEFT VOCAL CORD LESION;  Surgeon: Izora Gala, MD;  Location: [facility];  Service: ENT;  Laterality: Left;  . ORIF FOOT FRACTURE  2005   left    Social History:  reports that he has been smoking cigarettes. He has a 18.00 pack-year smoking history.  He has never used smokeless tobacco. He reports that he drinks about 15.0 standard drinks of alcohol per week. He reports that he has current or past drug history. Drugs:  and Cocaine.  Family History:  Family History  Problem Relation Age of Onset  . Breast cancer Mother   . Diabetes Father   . Hypertension Father   . Stroke Father   . Mental illness Sister   . Hypertension Daughter   . Mental illness Daughter   . Hypertension Brother   . Colon cancer Brother   . Breast cancer Sister      Prior  to Admission medications   Medication Sig Start Date End Date Taking? Authorizing Provider  acetaminophen (TYLENOL) 500 MG tablet Take 500 mg by mouth every 6 (six) hours as needed for mild pain.    Yes [provider]  aspirin 81 MG tablet Take 81 mg by mouth daily.   Yes [provider]  CIALIS 20 MG tablet Take 20 mg by mouth daily as needed for erectile dysfunction.  04/22/14  Yes [provider]  dexamethasone (DECADRON) 4 MG tablet Take 2 tablets (8 mg total) by mouth 2 (two) times daily with a meal. Take two times a day starting the day after chemotherapy for 3 days. 01/19/18  Yes Brunetta Genera, MD  lisinopril (PRINIVIL,ZESTRIL) 20 MG tablet Take 10 mg by mouth daily.   Yes [provider]  Multiple Vitamins-Minerals (MULTIVITAMIN WITH MINERALS) tablet Take 1 tablet by mouth daily.   Yes [provider]  tamsulosin (FLOMAX) 0.4 MG CAPS capsule Take 1 capsule (0.4 mg total) by mouth daily after supper. 04/13/18  Yes Brunetta Genera, MD  hydrocortisone (ANUSOL-HC) 2.5 % rectal cream Place 1 application rectally 2 (two) times daily as needed for hemorrhoids. Patient not taking: Reported on 04/16/2018 01/19/18   Brunetta Genera, MD  LORazepam (ATIVAN) 0.5 MG tablet Take 1 tablet (0.5 mg total) by mouth every 6 (six) hours as needed (Nausea or vomiting). Patient not taking: Reported on 04/16/2018 01/19/18   Brunetta Genera, MD  ondansetron (ZOFRAN) 8 MG tablet Take 1 tablet (8 mg total) by mouth every 8 (eight) hours as needed for nausea or vomiting. Patient not taking: Reported on 04/16/2018 01/12/18   Brunetta Genera, MD  prochlorperazine (COMPAZINE) 10 MG tablet Take 1 tablet (10 mg total) by mouth every 6 (six) hours as needed (Nausea or vomiting). Patient not taking: Reported on 04/16/2018 01/19/18   Brunetta Genera, MD    Physical Exam: Vitals:   04/17/18 0145 04/17/18 0200 04/17/18 0214 04/17/18 0215  BP: (!) 87/52 96/67  (!) 89/59 (!) 89/59  Pulse:      Resp: (!) 22 (!) 24 (!) 21 18  Temp:   98.4 F (36.9 C)   TempSrc:   Oral   SpO2:   96%   Weight:      Height:       General: Not in acute distress HEENT:       Eyes: PERRL, EOMI, no scleral icterus.       ENT: No discharge from the ears and nose, no pharynx injection, no tonsillar enlargement.        Neck: No JVD, no bruit, no mass felt. Heme: No neck lymph node enlargement. Cardiac: S1/S2, RRR, No murmurs, No gallops or rubs. Respiratory: No rales, wheezing, rhonchi or rubs. GI: Soft, nondistended, nontender, no rebound pain, no organomegaly, BS present. GU: No hematuria Ext: No pitting leg  edema bilaterally. 2+DP/PT pulse bilaterally. Musculoskeletal: No joint deformities, No joint redness or warmth, no limitation of ROM in spin. Skin: No rashes.  Neuro: mildly confused, but is oriented X3, cranial nerves II-XII grossly intact, moves all extremities normally. Neck is supple. Psych: Patient is not psychotic, no suicidal or hemocidal ideation.  Labs on Admission: I have personally reviewed following labs and imaging studies  CBC: Recent Labs  Lab 04/13/18 0957 04/16/18 1533  WBC 18.3* 17.3*  NEUTROABS 12.6*  --   HGB 7.7* 6.7*  HCT 23.7* 21.1*  MCV 89.8 91.3  PLT 571* 100*   Basic Metabolic Panel: Recent Labs  Lab 04/13/18 0957 04/16/18 1533  NA 140 135  K 3.5 3.5  CL 98 96*  CO2 28 26  GLUCOSE 140* 151*  BUN 9 24*  CREATININE 1.50* 1.68*  CALCIUM 9.5 8.9   GFR: Estimated Creatinine Clearance: 58.7 mL/min (A) (by C-G formula based on SCr of 1.68 mg/dL (H)). Liver Function Tests: Recent Labs  Lab 04/13/18 0957 04/16/18 1533  AST 23 127*  ALT 16 86*  ALKPHOS 87 106  BILITOT 0.8 1.1  PROT 6.9 6.7  ALBUMIN 3.1* 3.3*   No results for input(s): LIPASE, AMYLASE in the last 168 hours. No results for input(s): AMMONIA in the last 168 hours. Coagulation Profile: No results for input(s): INR, PROTIME in the last 168  hours. Cardiac Enzymes: No results for input(s): CKTOTAL, CKMB, CKMBINDEX, TROPONINI in the last 168 hours. BNP (last 3 results) No results for input(s): PROBNP in the last 8760 hours. HbA1C: No results for input(s): HGBA1C in the last 72 hours. CBG: Recent Labs  Lab 04/16/18 1629  GLUCAP 135*   Lipid Profile: No results for input(s): CHOL, HDL, LDLCALC, TRIG, CHOLHDL, LDLDIRECT in the last 72 hours. Thyroid Function Tests: No results for input(s): TSH, T4TOTAL, FREET4, T3FREE, THYROIDAB in the last 72 hours. Anemia Panel: No results for input(s): VITAMINB12, FOLATE, FERRITIN, TIBC, IRON, RETICCTPCT in the last 72 hours. Urine analysis:    Component Value Date/Time   COLORURINE AMBER (A) 02/20/2018 0853   APPEARANCEUR CLOUDY (A) 02/20/2018 0853   LABSPEC 1.018 02/20/2018 0853   PHURINE 5.0 02/20/2018 0853   GLUCOSEU NEGATIVE 02/20/2018 0853   HGBUR SMALL (A) 02/20/2018 0853   BILIRUBINUR NEGATIVE 02/20/2018 0853   BILIRUBINUR negative 12/22/2017 1545   KETONESUR 5 (A) 02/20/2018 0853   PROTEINUR 100 (A) 02/20/2018 0853   UROBILINOGEN 1.0 12/22/2017 1545   NITRITE NEGATIVE 02/20/2018 0853   LEUKOCYTESUR SMALL (A) 02/20/2018 0853   Sepsis Labs: @LABRCNTIP (procalcitonin:4,lacticidven:4) ) Recent Results (from the past 240 hour(s))  Culture, blood (routine x 2)     Status: None (Preliminary result)   Collection Time: 04/16/18  5:09 PM  Result Value Ref Range Status   Specimen Description   Final    BLOOD RIGHT ARM Performed at [facility], [place] [address] [place], [place] [address]    Special Requests   Final    BOTTLES DRAWN AEROBIC AND ANAEROBIC Blood Culture adequate volume Performed at [facility] [address] [place], [place] [address]    Culture PENDING  Incomplete   Report Status PENDING  Incomplete     Radiological Exams on Admission: Dg Chest Port 1 View  Result Date: 04/16/2018 CLINICAL DATA:  Shortness of breath,  hypertension EXAM: PORTABLE CHEST 1 VIEW COMPARISON:  Portable exam 1620 hours compared to 04/04/2006 FINDINGS: RIGHT jugular Port-A-Cath with tip projecting over SVC near cavoatrial junction. Upper normal  size of cardiac silhouette with slight vascular congestion. Mediastinal contours normal. Lungs clear. No infiltrate, pleural effusion or pneumothorax. IMPRESSION: No definite acute abnormalities. Electronically Signed   By: Lavonia Dana M.D.   On: 04/16/2018 17:01     EKG: Independently reviewed.  Sinus rhythm, QTC 537, bifascicular block, PAC Assessment/Plan Principal Problem:   Sepsis ([place]) Active Problems:   Diffuse large B cell lymphoma ([facility])   Anemia   Acute metabolic encephalopathy   AKI (acute kidney injury) ([place])   HTN (hypertension)   RLS (restless legs syndrome)   Abnormal LFTs   Possible sepsis [facility]): Patient is suspected to have possible sepsis due to AMS, hypotension, leukocytosis, fever, tachycardia and tachypnea.  No source of infection is identified.  No neck rigidity, and neck is supple, low suspicion for meningitis.  Patient is immunosuppressed due to chemotherapy, will start antibiotics with broad coverage. He has severe anemia which may have partially contributed to hypotension.  -will stepdown as inpatient -Vancomycin and Zosyn were started by Dr. Tommy Rainwater continue -Follow-up blood culture, urine culture, urinalysis -will get Procalcitonin and trend lactic acid levels per sepsis protocol. -IVF: total of 4L of NS bolus in ED, followed by 150 cc/h  -will check cortisol level, then 181m of Solu-Cortef stress dose  Acute metabolic encephalopathy: likely due to sepsis. -Frequent neuro checks, -Follow-up CT head -check ammonia level  Anemia: Hemoglobin 7.7 on 04/13/2018--> 6.7 today.  Likely due to lymphoma and chemotherapy -2 unit of blood was ordered for transfusion by Dr. KIrene Limbo-f/u by CBC  AKI: Likely due to dehydration and continuation of ACEI - IVF as  above - Follow up renal function by BMP - Hold lisinopril  HTN: -hold home lisinopril -Hold Flomax -IV hydralazine.  Abnormal liver function: Etiology is not clear.  Both patient and family denies alcohol abuse.  Patient had a negative hepatitis panel in August. -Check Tylenol level -Avoid using liver toxic medication, suggested Tylenol  RLS: -try pramipexole  Diffuse large B cell lymphoma ([place]: on chemotherapy, last dose was 3 weeks ago. -Dr. KIrene Limbois on board     Inpatient status:  # Patient requires inpatient status due to high intensity of service, high risk for further deterioration and high frequency of surveillance required.  I certify that at the point of admission it is my clinical judgment that the patient will require inpatient hospital care spanning beyond 2 midnights from the point of admission.  . 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 .  . Now patient has presenting symptoms include fever and AMS . The worrisome physical exam findings include  AMS, hypotension . The initial radiographic and laboratory data are worrisome because of hypotension, leukocytosis, sepsis, acute renal injury . Current medical needs: please see my assessment and plan . Predictability of an adverse outcome (risk): Patient is chemotherapy and immunosuppressed.  Presents with sepsis and hypotension. Source of infection is not identified.  Due to immunosuppressant status, patient is at  high risk of deteriorating.  Will needed to stay in the hospital for at least 2 days.    DVT ppx: SCD Code Status: Full code Family Communication:   Yes, patient's wife and daughter at bed side Disposition Plan:  Anticipate discharge back to previous home environment Consults called:  Dr. KIrene Limboof Oncology Admission status:  SDU/inpation       Date of Service 04/17/2018  Ivor Costa [facility] Pager [phone]  If 7PM-7AM, please contact night-coverage [email or web address] Password [email or web address] 04/17/2018, 2:31 AM

## 2018-04-17 NOTE — Progress Notes (Signed)
04/17/18  1316  Notified MD of 12 beat run of Vtach per tele monitor. BP 98/61. Pt in bed eating.

## 2018-04-17 NOTE — Progress Notes (Signed)
Patient ID: Jesus [name], male   DOB: [birth year], 62 y.o.   MRN: [id number] Patient was admitted early this morning for altered mental status and fever and started on broad-spectrum antibiotics for possible sepsis.  He was transferred to stepdown unit subsequently for hypotension and was given IV fluids and transfused 2 units packed red cells.  Patient seen and examined at bedside and plan of care discussed with him and his daughter at bedside.  His medical records including this morning's H&P reviewed by myself.  I have reviewed his labs and current medications as well.  We will continue IV fluids at 100 cc an hour.  Continue broad-spectrum antibiotics.  Follow cultures per.  Follow repeat a.m. labs.

## 2018-04-17 NOTE — Plan of Care (Signed)
  Problem: Elimination: Goal: Will not experience complications related to bowel motility Outcome: Progressing   Problem: Pain Managment: Goal: General experience of comfort will improve Outcome: Progressing   Problem: Nutrition: Goal: Adequate nutrition will be maintained Outcome: Progressing   

## 2018-04-17 NOTE — Progress Notes (Signed)
HEMATOLOGY/ONCOLOGY INPATIENT PROGRESS NOTE  Date of Service: 04/17/2018  Inpatient Attending: .Aline August, MD   SUBJECTIVE:   Jesus [name] is accompanied today by his daughter at bedside. [name].   The pt reports that he has felt "100% better" today after beginning his antibiotics. He notes that he was able to eat breakfast and lunch and has been staying well hydrated.   The pt denies any painful urination and has a catheter in place at this time.   Lab results today (04/17/18) of CBC w/diff and CMP is as follows: all values are WNL except for WBC at 35.7k, RBC at 2.64, HGB at 7.9, HCT at 24.4, RDW at 15.8, PLT at 469k, ANC at 32.6k, Abs immature granulocytes at 1.53k, Glucose at 213, BUN at 24, Creatinine at 1.92, Calcium at 7.9, Total Protein at 5.9, Albumin at 2.6, AST at 107, ALT at 78, Total Bilirubin at 1.3, GFR at 36. 04/17/18 Procalcitonin at 19.25  On review of systems, pt reports eating better, feeling better, improved energy levels, resolved confusions, resolved rigors, and denies abdominal pains, painful urination, flank pain, lower abdominal pain, and any other symptoms.    OBJECTIVE:  NAD  PHYSICAL EXAMINATION: . Vitals:   04/17/18 1200 04/17/18 1313 04/17/18 1400 04/17/18 1500  BP:  98/61 (!) 98/56 (!) 91/59  Pulse:  81 87 82  Resp:  20 (!) 29 17  Temp: (!) 97.4 F (36.3 C)     TempSrc: Oral     SpO2:  99% 99% 100%  Weight:      Height:       Filed Weights   04/16/18 1434  Weight: 245 lb 11.2 oz (111.4 kg)   .Body mass index is 33.32 kg/m.  GENERAL:alert, oriented and not confused today SKIN: no acute rashes, no significant lesions EYES: conjunctiva are pink and non-injected, sclera anicteric OROPHARYNX: MMM, no exudates, no oropharyngeal erythema or ulceration NECK: supple, no JVD LYMPH:  no palpable lymphadenopathy in the cervical, axillary or inguinal regions LUNGS: clear to auscultation b/l with  normal respiratory effort HEART: regular rate & rhythm ABDOMEN:  normoactive bowel sounds , non tender, not distended. No palpable hepatosplenomegaly. Foleys catheter in situ Extremity: no pedal edema PSYCH: alert & oriented x 3 with fluent speech NEURO: no focal motor/sensory deficits   MEDICAL HISTORY:  Past Medical History:  Diagnosis Date  . Allergy   . History of kidney stones   . Hyperlipidemia   . Hypertension   . Lymphadenopathy   . Pain, lower leg    Bilateral  . Peripheral arterial disease ([place])   . Pre-diabetes   . Red-green color blindness   . Snores   . Wears glasses     SURGICAL HISTORY: Past Surgical History:  Procedure Laterality Date  . CATARACT EXTRACTION W/ INTRAOCULAR LENS  IMPLANT, BILATERAL    . COLONOSCOPY    . dislodged salava stone    . FRACTURE SURGERY    . HAND ARTHROPLASTY  1995   crushed left hand  . INGUINAL LYMPH NODE BIOPSY Left 01/02/2018   Procedure: LEFT INGUINAL LYMPH NODE BIOPSY;  Surgeon: Rolm Bookbinder, MD;  Location: [facility];  Service: General;  Laterality: Left;  . IR IMAGING GUIDED PORT INSERTION  01/15/2018  . MICROLARYNGOSCOPY Left 01/17/2014   Procedure: MICROLARYNGOSCOPY WITH EXCISION OF THE BIOPSY OF LEFT VOCAL CORD LESION;  Surgeon: Izora Gala, MD;  Location: [facility];  Service: ENT;  Laterality:  Left;  . ORIF FOOT FRACTURE  2005   left    SOCIAL HISTORY: Social History   Socioeconomic History  . Marital status: Divorced    Spouse name: Not on file  . Number of children: 3  . Years of education: Not on file  . Highest education level: Not on file  Occupational History  . Not on file  Social Needs  . Financial resource strain: Not on file  . Food insecurity:    Worry: Not on file    Inability: Not on file  . Transportation needs:    Medical: Not on file    Non-medical: Not on file  Tobacco Use  . Smoking status: Current Every Day Smoker    Packs/day: 0.50    Years: 36.00    Pack years:  18.00    Types: Cigarettes  . Smokeless tobacco: Never Used  Substance and Sexual Activity  . Alcohol use: Yes    Alcohol/week: 15.0 standard drinks    Types: 10 Cans of beer, 5 Shots of liquor per week    Comment: weekends  . Drug use: Yes    Types: Cocaine    Comment: reports cocaine usage ~2X/ month; last use 12/26/17  . Sexual activity: Not on file  Lifestyle  . Physical activity:    Days per week: Not on file    Minutes per session: Not on file  . Stress: Not on file  Relationships  . Social connections:    Talks on phone: Not on file    Gets together: Not on file    Attends religious service: Not on file    Active member of club or organization: Not on file    Attends meetings of clubs or organizations: Not on file    Relationship status: Not on file  . Intimate partner violence:    Fear of current or ex partner: Not on file    Emotionally abused: Not on file    Physically abused: Not on file    Forced sexual activity: Not on file  Other Topics Concern  . Not on file  Social History Narrative  . Not on file    FAMILY HISTORY: Family History  Problem Relation Age of Onset  . Breast cancer Mother   . Diabetes Father   . Hypertension Father   . Stroke Father   . Mental illness Sister   . Hypertension Daughter   . Mental illness Daughter   . Hypertension Brother   . Colon cancer Brother   . Breast cancer Sister     ALLERGIES:  is allergic to bee venom.  MEDICATIONS:  Scheduled Meds: . Chlorhexidine Gluconate Cloth  6 each Topical Daily  . sodium chloride flush  10-40 mL Intracatheter Q12H   Continuous Infusions: . sodium chloride Stopped (04/17/18 1026)  . sodium chloride 100 mL/hr at 04/17/18 1430  . cefTRIAXone (ROCEPHIN)  IV     PRN Meds:.sodium chloride, heparin lock flush, heparin lock flush, hydrALAZINE, hydrocortisone, ondansetron (ZOFRAN) IV, oxyCODONE, pramipexole, sodium chloride flush, sodium chloride flush, sodium chloride flush  REVIEW OF  SYSTEMS:    A 10+ POINT REVIEW OF SYSTEMS WAS OBTAINED including neurology, dermatology, psychiatry, cardiac, respiratory, lymph, extremities, GI, GU, Musculoskeletal, constitutional, breasts, reproductive, HEENT.  All pertinent positives are noted in the HPI.  All others are negative.    LABORATORY DATA:  I have reviewed the data as listed  . CBC Latest Ref Rng & Units 04/17/2018 04/16/2018 04/13/2018  WBC 4.0 - 10.5  K/uL 35.7(H) 17.3(H) 18.3(H)  Hemoglobin 13.0 - 17.0 g/dL 7.9(L) 6.7(LL) 7.7(L)  Hematocrit 39.0 - 52.0 % 24.4(L) 21.1(L) 23.7(L)  Platelets 150 - 400 K/uL 469(H) 650(H) 571(H)    . CMP Latest Ref Rng & Units 04/17/2018 04/16/2018 04/13/2018  Glucose 70 - 99 mg/dL 213(H) 151(H) 140(H)  BUN 8 - 23 mg/dL 24(H) 24(H) 9  Creatinine 0.61 - 1.24 mg/dL 1.92(H) 1.68(H) 1.50(H)  Sodium 135 - 145 mmol/L 136 135 140  Potassium 3.5 - 5.1 mmol/L 4.2 3.5 3.5  Chloride 98 - 111 mmol/L 102 96(L) 98  CO2 22 - 32 mmol/L _0 Calcium 8.9 - 10.3 mg/dL 7.9(L) 8.9 9.5  Total Protein 6.5 - 8.1 g/dL 5.9(L) 6.7 6.9  Total Bilirubin 0.3 - 1.2 mg/dL 1.3(H) 1.1 0.8  Alkaline Phos 38 - 126 U/L 112 106 87  AST 15 - 41 U/L 107(H) 127(H) 23  ALT 0 - 44 U/L 78(H) 86(H) 16     RADIOGRAPHIC STUDIES: I have personally reviewed the radiological images as listed and agreed with the findings in the report. Dg Chest Port 1 View  Result Date: 04/16/2018 CLINICAL DATA:  Shortness of breath, hypertension EXAM: PORTABLE CHEST 1 VIEW COMPARISON:  Portable exam 1620 hours compared to 04/04/2006 FINDINGS: RIGHT jugular Port-A-Cath with tip projecting over SVC near cavoatrial junction. Upper normal size of cardiac silhouette with slight vascular congestion. Mediastinal contours normal. Lungs clear. No infiltrate, pleural effusion or pneumothorax. IMPRESSION: No definite acute abnormalities. Electronically Signed   By: Lavonia Dana M.D.   On: 04/16/2018 17:01   Dg Fluoro Guide Lumbar Puncture  Result Date:  03/29/2018 CLINICAL DATA:  B-cell lymphoma, diagnostic lumbar puncture and intrathecal methotrexate administration EXAM: DIAGNOSTIC LUMBAR PUNCTURE ATTEMPT UNDER FLUOROSCOPIC GUIDANCE FLUOROSCOPY TIME:  Fluoroscopy Time:  1 minutes, 46 seconds Radiation Exposure Index (if provided by the fluoroscopic device): 19.2 mGy Number of Acquired Spot Images: 0 PROCEDURE: I discussed the risks (including hemorrhage, infection, headache, meningitis, and nerve damage, among others), benefits, and alternatives to fluoroscopically guided lumbar puncture with the patient. We discussed the high probability of success of the procedure. The patient understood and elected to undergo the procedure. Standard time-out was employed. Following sterile skin prep and local anesthetic administration consisting of 1 percent lidocaine, a 22 gauge spinal needle was advanced into the spinal canal at the L3-4 level. With repositionings, a small amount of CSF was observed in the needle hub. The CSF pressure seemed to be low and would not flow with the patient prone, even with the head of the table tilted up to 15 degrees. I turned the patient onto his side in order to attempt to increase the CSF flow by reducing the hydrostatic effects of the needle orientation. This was not successful in causing the CSF to flow. Lateral imaging was obtained showing the needle in the correct position along the spinal canal. The needle was slowly withdrawn from the midline in the spinal canal without any CSF flow. At this point I considered the possibility of prominent epidural adipose tissue or some other cause for the lack of flow in addition to the low pressures. There is certainly some spinal stenosis in the lumbar spine based on prior imaging. Accordingly, I re-prepped and draped the patient and administered local anesthetic, and advanced the needle in the midline at the L2-3 level into the spinal canal. Once again, I obtained a tiny amount of CSF in the hub  of the new needle, but no adequate CSF flow.  The CSF pressure seemed low. We tilted the table again, and repositionings and rotations of the needle performed and confirmed to be in the appropriate location at fluoroscopy. However, sustained flow could not be obtained and accordingly I could not be confident that medication administration via the spinal needle would definitely into the thecal sac. At this point, having tried 2 adjacent levels, the attempt at lumbar puncture was stopped. The skin was cleansed and bandaged. IMPRESSION: 1. Unsuccessful fluoroscopically guided lumbar puncture. Needle positioning at the 2 levels (L2-3 and L3-4) attempted was correct based on frontal and lateral fluoroscopic images, and a small amount of CSF was noted in the needle hubs at both levels, but actual flow of CSF through the needle could not be sustained despite turning the patient lateral, tilting the table up, and needle repositioning at each level. The lack of CSF flow meant I could be certain that the needle was in the thecal sac as opposed to in the epidural space, and accordingly the methotrexate could not be administered. 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 I would suggest aggressive hydration prior to any repeated attempts. Based on the appearance of spinal stenosis, I would also recommend avoiding the L4-5 level, as I did. Electronically Signed   By: Van Clines M.D.   On: 03/29/2018 13:31    ASSESSMENT & PLAN:  62 y.o. 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  CT of the abdomen and pelvis performed on 12/22/2017 showed bulky left inguinal, left  hemipelvic, and retroperitoneal adenopathy.   01/02/18 Left inguinal LN Biopsy revealed T-Cell/histocyte rich Large B-Cell Lymphoma  12/27/17 ECHO revealed LV EF of 55-60%   01/05/18 PET/CT revealedMassively enlarged pelvic lymph nodes intense metabolic activity consistent lymphoma. 2. Additional hypermetabolic lymph nodes in the porta hepatis and retroperitoneum LEFT aorta. 3. Solitary hypermetabolic mediastinal lymph node in the upper LEFT Mediastinum. 4. 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 5. Normal spleen.  HIV non reactive on 12/22/2017.Hep C and hep B serology negative.  03/14/18 PET/CT revealedPET-CT findings suggest an excellent response to chemotherapy. The abdominal lymphadenopathy has near completely resolved and demonstrates a near complete metabolic response. The pelvic and inguinal adenopathy has significantly decreased in size and the metabolic activity has significantly decreased. 2. Diffuse marrow activity likely due to chemotherapy and or marrow stimulating drugs. I do not see any discrete persistent lesions.   2) left lower extremity swelling- now resolved Doppler ultrasound for DVT was negative in the left lower extremity.  Likely from venous compression +/- lymphatic obstruction from bulky left inguinal, left hemipelvic, and retroperitoneal adenopathy.   3) Intermediate to high risk for tumor lysis syndrome.- no TLS noted with allopurinol prophylaxis after C1. Off allopurinol.  4) S/p Port a cath placement   5)h/oE.coli UTI - Pansensitive -Resolved.Also appearedto have BPH like symptomatology.  6) E.COLi sepsis - likely from urinary source. Recent h/o E.coli UTI Elevated procalcitonin at nearly 20 -CXR - NAI No port a cath site erythema /redness.  7) Symptomatic Anemia Hgb 6.7 - due to chemotherapy/sepsis- upt o 7.9 s/p PRBC transfusion. No overt evidence of bleeding.  8) Dehydration - AKI  creatinine 1.92 Poor po intake + Sepsis. Cannot r/o urinary obstruction from BPH  9) Elevated  transaminases nl ALK PO4 and bilirubin - likely from sepsis. Improving  10) AMS - likely from delirium due to sepsis, dehydration. CT head -pending  PLAN: -Discussed pt labwork today, 04/17/18; ANC increased to 32.6k, PLT decreased to 469k,, HGB increased to 7.9 -Will order 1 additional unit of PRBCs -04/17/18 Procalcitonin at 19.25 -04/16/18 Blood culture ID revealed E.coli - awaiting sensitivities. Likely urinary source. If Fluoroquinolone sensitive will need to discharge on levofloxacin - given concern for recurrent infection concern for prostatitis vs recurrent risk from urinary obstruction - will likely need extended course of Abx 2-3 week. -Continue eating well, staying hydrated -okay to consider removing foleys catheter tomorrow and doing voiding trial and bladder scan for post void residuals -04/17/18 CT Head results are pending  -hold chemotherapy consideration at this time. -continue zosyn . Given proven E.coli sepsis like discontinue vancomycin  -IVF and fluid resuscitation -hold anti HTN -appreciate excellent care by hospitalist team.  10) DVT prophylaxis -lovenox  11) HTN -hold anti HTN  -could restart flomax if BP okay.  . The total time spent in the appointment was 35 minutes and more than 50% was on counseling and direct patient cares.      Sullivan Lone MD MS AAHIVMS [facility] Hematology/Oncology Physician [facility]  (Office):       [phone] (Work cell):  [phone] (Fax):           [phone]  04/17/2018 4:59 PM   I, Baldwin [place], am acting as a scribe for Dr. Sullivan Lone.   .I have reviewed the above documentation for accuracy and completeness, and I agree with the above. Sullivan Lone MD MS

## 2018-04-17 NOTE — Progress Notes (Signed)
PHARMACY - PHYSICIAN COMMUNICATION CRITICAL VALUE ALERT - BLOOD CULTURE IDENTIFICATION (BCID)  Jesus [name] is an 62 y.o. male who presented to [facility] on 04/16/2018 with a chief complaint of sepiss  Assessment:  E coli bacteremia 4/4 source unkown  Name of physician (or Provider) ContactedStarla Link  Current antibiotics: vancomycin and Zosyn  Changes to prescribed antibiotics recommended: narrow to ceftriaxone pending sensitivities  Recommendations accepted by provider  Results for orders placed or performed during the hospital encounter of 04/16/18  Blood Culture ID Panel (Reflexed) (Collected: 04/16/2018  5:04 PM)  Result Value Ref Range   Enterococcus species NOT DETECTED NOT DETECTED   Listeria monocytogenes NOT DETECTED NOT DETECTED   Staphylococcus species NOT DETECTED NOT DETECTED   Staphylococcus aureus (BCID) NOT DETECTED NOT DETECTED   Streptococcus species NOT DETECTED NOT DETECTED   Streptococcus agalactiae NOT DETECTED NOT DETECTED   Streptococcus pneumoniae NOT DETECTED NOT DETECTED   Streptococcus pyogenes NOT DETECTED NOT DETECTED   Acinetobacter baumannii NOT DETECTED NOT DETECTED   Enterobacteriaceae species DETECTED (A) NOT DETECTED   Enterobacter cloacae complex NOT DETECTED NOT DETECTED   Escherichia coli DETECTED (A) NOT DETECTED   Klebsiella oxytoca NOT DETECTED NOT DETECTED   Klebsiella pneumoniae NOT DETECTED NOT DETECTED   Proteus species NOT DETECTED NOT DETECTED   Serratia marcescens NOT DETECTED NOT DETECTED   Carbapenem resistance NOT DETECTED NOT DETECTED   Haemophilus influenzae NOT DETECTED NOT DETECTED   Neisseria meningitidis NOT DETECTED NOT DETECTED   Pseudomonas aeruginosa NOT DETECTED NOT DETECTED   Candida albicans NOT DETECTED NOT DETECTED   Candida glabrata NOT DETECTED NOT DETECTED   Candida krusei NOT DETECTED NOT DETECTED   Candida parapsilosis NOT DETECTED NOT DETECTED   Candida tropicalis NOT DETECTED NOT DETECTED     Napoleon Form 04/17/2018  2:23 PM

## 2018-04-17 NOTE — Progress Notes (Addendum)
Patient around 2245 appeared extremely lethargic, pale, and confused.  It was difficult getting patient to [facility] and pt. appeared diaphoretic (looked as if he was about to pass out).  Patient also reported that he had not voided in over a day.  Bladder scan only revealed 15cc, but foley was placed anyway per MD order (output monitoring, voiding problems). A small amount of dark urine with sediment resulted.   Patient's blood pressure was low and pulse high.  MD was notified and Rapid Response called.  Patient was bolused 1 L and vancomycin started, he was then transferred to ICU.  ICU to resume care and will administer 2 units PRBCs once second IV line started.Roderick Pee

## 2018-04-18 DIAGNOSIS — R7881 Bacteremia: Secondary | ICD-10-CM

## 2018-04-18 DIAGNOSIS — I10 Essential (primary) hypertension: Secondary | ICD-10-CM

## 2018-04-18 DIAGNOSIS — D72829 Elevated white blood cell count, unspecified: Secondary | ICD-10-CM

## 2018-04-18 LAB — CBC WITH DIFFERENTIAL/PLATELET
Abs Immature Granulocytes: 0.91 10*3/uL — ABNORMAL HIGH (ref 0.00–0.07)
Basophils Absolute: 0.1 10*3/uL (ref 0.0–0.1)
Basophils Relative: 0 %
Eosinophils Absolute: 0.1 10*3/uL (ref 0.0–0.5)
Eosinophils Relative: 0 %
HCT: 25.6 % — ABNORMAL LOW (ref 39.0–52.0)
Hemoglobin: 8.3 g/dL — ABNORMAL LOW (ref 13.0–17.0)
Immature Granulocytes: 3 %
Lymphocytes Relative: 5 %
Lymphs Abs: 1.4 10*3/uL (ref 0.7–4.0)
MCH: 29.1 pg (ref 26.0–34.0)
MCHC: 32.4 g/dL (ref 30.0–36.0)
MCV: 89.8 fL (ref 80.0–100.0)
Monocytes Absolute: 2.1 10*3/uL — ABNORMAL HIGH (ref 0.1–1.0)
Monocytes Relative: 7 %
Neutro Abs: 26.5 10*3/uL — ABNORMAL HIGH (ref 1.7–7.7)
Neutrophils Relative %: 85 %
Platelets: 541 10*3/uL — ABNORMAL HIGH (ref 150–400)
RBC: 2.85 MIL/uL — ABNORMAL LOW (ref 4.22–5.81)
RDW: 16 % — ABNORMAL HIGH (ref 11.5–15.5)
WBC: 31.1 10*3/uL — ABNORMAL HIGH (ref 4.0–10.5)
nRBC: 0 % (ref 0.0–0.2)

## 2018-04-18 LAB — PREPARE RBC (CROSSMATCH)

## 2018-04-18 LAB — COMPREHENSIVE METABOLIC PANEL
ALT: 58 U/L — ABNORMAL HIGH (ref 0–44)
AST: 47 U/L — ABNORMAL HIGH (ref 15–41)
Albumin: 2.7 g/dL — ABNORMAL LOW (ref 3.5–5.0)
Alkaline Phosphatase: 100 U/L (ref 38–126)
Anion gap: 12 (ref 5–15)
BUN: 24 mg/dL — ABNORMAL HIGH (ref 8–23)
CO2: 21 mmol/L — ABNORMAL LOW (ref 22–32)
Calcium: 8.1 mg/dL — ABNORMAL LOW (ref 8.9–10.3)
Chloride: 103 mmol/L (ref 98–111)
Creatinine, Ser: 1.38 mg/dL — ABNORMAL HIGH (ref 0.61–1.24)
GFR calc Af Amer: >60 mL/min (ref >60)
GFR calc non Af Amer: 54 mL/min — ABNORMAL LOW (ref >60)
Potassium: 3.3 mmol/L — ABNORMAL LOW (ref 3.5–5.1)
Sodium: 136 mmol/L (ref 135–145)
Total Bilirubin: 0.5 mg/dL (ref 0.3–1.2)
Total Protein: 6 g/dL — ABNORMAL LOW (ref 6.5–8.1)

## 2018-04-18 LAB — CBC
HCT: 26.7 % — ABNORMAL LOW (ref 39.0–52.0)
Hemoglobin: 8.4 g/dL — ABNORMAL LOW (ref 13.0–17.0)
MCH: 28.9 pg (ref 26.0–34.0)
MCHC: 31.5 g/dL (ref 30.0–36.0)
MCV: 91.8 fL (ref 80.0–100.0)
Platelets: 451 10*3/uL — ABNORMAL HIGH (ref 150–400)
RBC: 2.91 MIL/uL — ABNORMAL LOW (ref 4.22–5.81)
RDW: 16.3 % — ABNORMAL HIGH (ref 11.5–15.5)
WBC: 20.5 10*3/uL — ABNORMAL HIGH (ref 4.0–10.5)
nRBC: 0 % (ref 0.0–0.2)

## 2018-04-18 LAB — URINE CULTURE: Culture: NO GROWTH

## 2018-04-18 LAB — PROCALCITONIN: Procalcitonin: 14.58 ng/mL

## 2018-04-18 LAB — COMPREHENSIVE METABOLIC PANEL WITH GFR: Glucose, Bld: 168 mg/dL — ABNORMAL HIGH (ref 70–99)

## 2018-04-18 MED ORDER — DIPHENHYDRAMINE HCL 25 MG PO CAPS
25.0000 mg | ORAL_CAPSULE | Freq: Once | ORAL | Status: AC
  Administered 2018-04-18: 25 mg via ORAL
  Filled 2018-04-18: qty 1

## 2018-04-18 MED ORDER — SODIUM CHLORIDE 0.9% FLUSH: 3.0000 mL | INTRAVENOUS | Status: DC | PRN

## 2018-04-18 MED ORDER — SODIUM CHLORIDE 0.9% IV SOLUTION
250.0000 mL | Freq: Once | INTRAVENOUS | Status: AC
  Administered 2018-04-18: 250 mL via INTRAVENOUS

## 2018-04-18 MED ORDER — POTASSIUM CHLORIDE CRYS ER 20 MEQ PO TBCR
40.0000 meq | EXTENDED_RELEASE_TABLET | ORAL | Status: AC
  Administered 2018-04-18 (×2): 40 meq via ORAL
  Filled 2018-04-18 (×2): qty 2

## 2018-04-18 MED ORDER — HEPARIN SOD (PORK) LOCK FLUSH 100 UNIT/ML IV SOLN: 250.0000 [IU] | INTRAVENOUS | Status: DC | PRN

## 2018-04-18 MED ORDER — ACETAMINOPHEN 325 MG PO TABS
650.0000 mg | ORAL_TABLET | Freq: Once | ORAL | Status: AC
  Administered 2018-04-18: 650 mg via ORAL
  Filled 2018-04-18: qty 2

## 2018-04-18 MED ORDER — SODIUM CHLORIDE 0.9% FLUSH: 10.0000 mL | INTRAVENOUS | Status: DC | PRN

## 2018-04-18 MED ORDER — HEPARIN SOD (PORK) LOCK FLUSH 100 UNIT/ML IV SOLN: 500.0000 [IU] | Freq: Every day | INTRAVENOUS | Status: DC | PRN

## 2018-04-18 NOTE — Progress Notes (Signed)
Patient ID: Jesus [name], male   DOB: [birth year], 62 y.o.   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  He was supposed to have elective chemotherapy but was found to have AMS and fever and subsequently started on broad-spectrum intravenous antibiotics.  He had to be transferred to stepdown unit because of persistent hypotension and altered mental status.  Hemoglobin was found to be 6.7, he was transfused 2 units of packed red cells.   Assessment & Plan:   Principal Problem:   Sepsis ([place]) Active Problems:   Diffuse large B cell lymphoma ([facility])   Anemia   Acute metabolic encephalopathy   AKI (acute kidney injury) ([place])   HTN (hypertension)   RLS (restless legs syndrome)   Abnormal LFTs  Sepsis from E. coli bacteremia/UTI -Hemodynamically improving.  Blood pressure much better.  Appetite improving.  Discontinue IV fluids.  Antibiotics plan as below  E. coli bacteremia most likely from urinary tract infection -Initially started on vancomycin and Zosyn which were narrowed to Rocephin on 04/17/2018.  Awaiting susceptibilities. -Probably from UTI versus prostatitis but since the patient has a Port-A-Cath, will repeat blood cultures for tomorrow.  Blood cultures are persistently positive, might have to consider removing Port-A-Cath -Continue Rocephin for now  Leukocytosis -Improving.  Monitor  Acute kidney injury -Likely due to combination of dehydration and ACEI.  Improving.  Monitor.  Discontinue IV fluids.  Lisinopril on hold  Acute metabolic encephalopathy -Most likely secondary to sepsis.  Mental status has much improved.  CT of the head showed some hypodensities which might be artifact  but could not rule out encephalitis versus Warnicke's encephalopathy.  Mental status has much improved so will hold off on further imaging studies.  Does not look like patient has encephalitis.  Hypertension -Blood pressure has much improved.  Antihypertensives on hold  Chronic anemia and thrombocytosis -Most likely secondary to lymphoma -Hemoglobin was 6.7 on presentation.  Status post transfusion of 2 units of packed red cells.  Hemoglobin is 8.3 this morning.  Monitor  Abnormal LFTs--probably secondary to sepsis.  Improving.  Monitor  Hypokalemia -Replace.  Repeat a.m. labs  Generalized deconditioning -PT eval.   Stage IV T-cell/histocyte rich large B-cell lymphoma -Oncology following.  Current chemotherapy on hold   DVT prophylaxis: SCDs Code Status: Full Family Communication: Daughter at bedside Disposition Plan: Home in 1 to 2 days if clinically improves  Consultants: Oncology  Procedures: None  Antimicrobials: Vancomycin and Zosyn 04/16/2018 Rocephin from 04/17/2018 onwards   Subjective: Patient seen and examined at bedside.  He feels much better today.  His appetite is better.  No overnight fever, nausea, vomiting or worsening abdominal pain.  Objective: Vitals:   04/18/18 0600 04/18/18 0700 04/18/18 0800 04/18/18 0900  BP: 115/61 (!) 108/59 118/72   Pulse:      Resp: (!) 21 (!) 22 19 18   Temp:   (!) 97.2 F (36.2 C)   TempSrc:   Oral   SpO2:      Weight:      Height:        Intake/Output Summary (Last 24 hours) at 04/18/2018 1044 Last data filed at 04/18/2018 0900 Gross per 24 hour  Intake 3432.7 ml  Output 3250 ml  Net  182.7 ml   Filed Weights   04/16/18 1434  Weight: 111.4 kg    Examination:  General exam: Appears calm and comfortable, no distress Respiratory system: Bilateral decreased breath sounds at bases, no wheezing.  Intermittent tachypnea Cardiovascular system: S1 & S2 heard, Rate controlled Gastrointestinal system: Abdomen is  obese, nondistended, soft and nontender. Normal bowel sounds heard. Extremities: No cyanosis, clubbing; trace edema Central nervous system: Alert and oriented. No focal neurological deficits. Moving extremities Skin: No rashes, lesions or ulcers Psychiatry: Judgement and insight appear normal. Mood & affect appropriate.     Data Reviewed: I have personally reviewed following labs and imaging studies  CBC: Recent Labs  Lab 04/13/18 0957 04/16/18 1533 04/17/18 0600 04/18/18 0312  WBC 18.3* 17.3* 35.7* 31.1*  NEUTROABS 12.6*  --  32.6* 26.5*  HGB 7.7* 6.7* 7.9* 8.3*  HCT 23.7* 21.1* 24.4* 25.6*  MCV 89.8 91.3 92.4 89.8  PLT 571* 650* 469* 841*   Basic Metabolic Panel: Recent Labs  Lab 04/13/18 0957 04/16/18 1533 04/17/18 0600 04/18/18 0312  NA 140 135 136 136  K 3.5 3.5 4.2 3.3*  CL 98 96* 102 103  CO2 28 26 22  21*  GLUCOSE 140* 151* 213* 168*  BUN 9 24* 24* 24*  CREATININE 1.50* 1.68* 1.92* 1.38*  CALCIUM 9.5 8.9 7.9* 8.1*   GFR: Estimated Creatinine Clearance: 71.5 mL/min (A) (by C-G formula based on SCr of 1.38 mg/dL (H)). Liver Function Tests: Recent Labs  Lab 04/13/18 0957 04/16/18 1533 04/17/18 0600 04/18/18 0312  AST 23 127* 107* 47*  ALT 16 86* 78* 58*  ALKPHOS 87 106 112 100  BILITOT 0.8 1.1 1.3* 0.5  PROT 6.9 6.7 5.9* 6.0*  ALBUMIN 3.1* 3.3* 2.6* 2.7*   No results for input(s): LIPASE, AMYLASE in the last 168 hours. Recent Labs  Lab 04/17/18 0600  AMMONIA 9   Coagulation Profile: Recent Labs  Lab 04/17/18 0600  INR 1.35   Cardiac Enzymes: No results for input(s): CKTOTAL, CKMB, CKMBINDEX, TROPONINI in the last 168 hours. BNP (last 3 results) No results for input(s): PROBNP in the last 8760 hours. HbA1C: No results for input(s): HGBA1C in the last 72 hours. CBG: Recent Labs  Lab 04/16/18 1629  GLUCAP 135*   Lipid Profile: No results for input(s): CHOL, HDL, LDLCALC, TRIG, CHOLHDL, LDLDIRECT in the last 72 hours. Thyroid Function  Tests: No results for input(s): TSH, T4TOTAL, FREET4, T3FREE, THYROIDAB in the last 72 hours. Anemia Panel: No results for input(s): VITAMINB12, FOLATE, FERRITIN, TIBC, IRON, RETICCTPCT in the last 72 hours. Sepsis Labs: Recent Labs  Lab 04/16/18 1656 04/17/18 0600 04/18/18 0312  PROCALCITON 2.00 19.25 14.58  LATICACIDVEN  --  1.0  --     Recent Results (from the past 240 hour(s))  Culture, blood (routine x 2)     Status: Abnormal (Preliminary result)   Collection Time: 04/16/18  5:04 PM  Result Value Ref Range Status   Specimen Description   Final    BLOOD RIGHT ARM Performed at [facility] [address], [place], [place] [address]    Special Requests   Final    BOTTLES DRAWN AEROBIC AND ANAEROBIC Blood Culture adequate volume Performed at [facility] [address] [place], [place] [address]    Culture  Setup Time   Final    GRAM NEGATIVE RODS IN BOTH AEROBIC AND ANAEROBIC BOTTLES CRITICAL RESULT CALLED TO, READ BACK BY AND VERIFIED WITH: Chales Abrahams PharmD 11:40 04/17/18 ([email or web address])  Culture (A)  Final    ESCHERICHIA COLI SUSCEPTIBILITIES TO FOLLOW Performed at [facility], [place] [address] [place], [place] [address]    Report Status PENDING  Incomplete  Blood Culture ID Panel (Reflexed)     Status: Abnormal   Collection Time: 04/16/18  5:04 PM  Result Value Ref Range Status   Enterococcus species NOT DETECTED NOT DETECTED Final   Listeria monocytogenes NOT DETECTED NOT DETECTED Final   Staphylococcus species NOT DETECTED NOT DETECTED Final   Staphylococcus aureus (BCID) NOT DETECTED NOT DETECTED Final   Streptococcus species NOT DETECTED NOT DETECTED Final   Streptococcus agalactiae NOT DETECTED NOT DETECTED Final   Streptococcus pneumoniae NOT DETECTED NOT DETECTED Final   Streptococcus pyogenes NOT DETECTED NOT DETECTED Final   Acinetobacter baumannii NOT DETECTED NOT DETECTED Final   Enterobacteriaceae species  DETECTED (A) NOT DETECTED Final    Comment: Enterobacteriaceae represent a large family of gram-negative bacteria, not a single organism. CRITICAL RESULT CALLED TO, READ BACK BY AND VERIFIED WITH: Chales Abrahams PharmD 11:40 04/17/18 ([email or web address])    Enterobacter cloacae complex NOT DETECTED NOT DETECTED Final   Escherichia coli DETECTED (A) NOT DETECTED Final    Comment: CRITICAL RESULT CALLED TO, READ BACK BY AND VERIFIED WITH: Chales Abrahams PharmD 11:40 04/17/18 ([email or web address])    Klebsiella oxytoca NOT DETECTED NOT DETECTED Final   Klebsiella pneumoniae NOT DETECTED NOT DETECTED Final   Proteus species NOT DETECTED NOT DETECTED Final   Serratia marcescens NOT DETECTED NOT DETECTED Final   Carbapenem resistance NOT DETECTED NOT DETECTED Final   Haemophilus influenzae NOT DETECTED NOT DETECTED Final   Neisseria meningitidis NOT DETECTED NOT DETECTED Final   Pseudomonas aeruginosa NOT DETECTED NOT DETECTED Final   Candida albicans NOT DETECTED NOT DETECTED Final   Candida glabrata NOT DETECTED NOT DETECTED Final   Candida krusei NOT DETECTED NOT DETECTED Final   Candida parapsilosis NOT DETECTED NOT DETECTED Final   Candida tropicalis NOT DETECTED NOT DETECTED Final    Comment: Performed at [facility], [place] [address] [place], [place] [address]  Culture, blood (routine x 2)     Status: None (Preliminary result)   Collection Time: 04/16/18  5:09 PM  Result Value Ref Range Status   Specimen Description   Final    BLOOD RIGHT ARM Performed at [facility], [place] [address], [place], [place] [address]    Special Requests   Final    BOTTLES DRAWN AEROBIC AND ANAEROBIC Blood Culture adequate volume Performed at [facility] [address] [place], [place] [address]    Culture  Setup Time   Final    GRAM NEGATIVE RODS IN BOTH AEROBIC AND ANAEROBIC BOTTLES IDENTIFICATION TO FOLLOW CRITICAL VALUE NOTED.  VALUE IS CONSISTENT WITH PREVIOUSLY REPORTED AND CALLED  VALUE. Performed at [facility], [place] [address], [place], [place] [address]    Culture GRAM NEGATIVE RODS  Final   Report Status PENDING  Incomplete  Urine culture     Status: None   Collection Time: 04/17/18 12:55 AM  Result Value Ref Range Status   Specimen Description   Final    URINE, RANDOM Performed at [facility], [place] [address], [place], [place] [address]    Special Requests   Final    NONE Performed at [facility], [place] [address] [place], [place] [address]    Culture   Final    NO GROWTH Performed at [facility], [place] [address], [place], [place] [address]    Report Status 04/18/2018 FINAL  Final  MRSA PCR Screening     Status: None   Collection Time: 04/17/18  2:04 AM  Result Value Ref Range Status   MRSA by PCR NEGATIVE NEGATIVE Final    Comment:        The GeneXpert MRSA Assay (FDA approved for NASAL specimens only), is one component of a comprehensive MRSA colonization surveillance program. It is not intended to diagnose MRSA infection nor to guide or monitor treatment for MRSA infections. Performed at [facility], [place] [address] [place], [place] [address]          Radiology Studies: Ct Head Wo Contrast  Result Date: 04/17/2018 CLINICAL DATA:  Altered mental status, fever. History of lymphoma, hypertension and hyperlipidemia. EXAM: CT HEAD WITHOUT CONTRAST TECHNIQUE: Contiguous axial images were obtained from the base of the skull through the vertex without intravenous contrast. COMPARISON:  None. FINDINGS: BRAIN: No intraparenchymal hemorrhage, mass effect nor midline shift. Subtle bilateral mesial caudothalamic hypodensities. The ventricles and sulci are normal for age. Patchy supratentorial white matter hypodensities. No acute large vascular territory infarcts. No abnormal extra-axial fluid collections. Basal cisterns are patent. VASCULAR: Unremarkable. SKULL: No skull fracture. No significant  scalp soft tissue swelling. SINUSES/ORBITS: Trace paranasal sinus mucosal thickening. Mastoid air cells are well aerated.The included ocular globes and orbital contents are non-suspicious. Status post bilateral ocular lens implants. OTHER: None. IMPRESSION: 1. Subtle mesial caudothalamic hypodensities may be artifact though, the could reflect encephalitis or Wernicke's encephalopathy. Consider MRI of the head with and without contrast. 2. Mild chronic small vessel ischemic changes. Electronically Signed   By: Elon Alas M.D.   On: 04/17/2018 17:29   Dg Chest Port 1 View  Result Date: 04/16/2018 CLINICAL DATA:  Shortness of breath, hypertension EXAM: PORTABLE CHEST 1 VIEW COMPARISON:  Portable exam 1620 hours compared to 04/04/2006 FINDINGS: RIGHT jugular Port-A-Cath with tip projecting over SVC near cavoatrial junction. Upper normal size of cardiac silhouette with slight vascular congestion. Mediastinal contours normal. Lungs clear. No infiltrate, pleural effusion or pneumothorax. IMPRESSION: No definite acute abnormalities. Electronically Signed   By: Lavonia Dana M.D.   On: 04/16/2018 17:01        Scheduled Meds: . sodium chloride  250 mL Intravenous Once  . acetaminophen  650 mg Oral Once  . Chlorhexidine Gluconate Cloth  6 each Topical Daily  . diphenhydrAMINE  25 mg Oral Once  . potassium chloride  40 mEq Oral Q4H  . sodium chloride flush  10-40 mL Intracatheter Q12H   Continuous Infusions: . sodium chloride Stopped (04/17/18 1026)  . sodium chloride 100 mL/hr at 04/17/18 2356  . cefTRIAXone (ROCEPHIN)  IV Stopped (04/17/18 1755)     LOS: 2 days        Aline August, MD [facility] Pager [phone]  If 7PM-7AM, please contact night-coverage [email or web address] Password [facility] 04/18/2018, 10:44 AM

## 2018-04-18 NOTE — Progress Notes (Signed)
HEMATOLOGY/ONCOLOGY INPATIENT PROGRESS NOTE  Date of Service: 04/18/2018  Inpatient Attending: .Aline August, MD   SUBJECTIVE:   Jesus [name]  is accompanied today by his daughters at bedside. [name].   The pt reports that he is continuing to feel better today. The pt notes that he has not had any confusion at all today, and his daughter agree with this.   The pt notes that he has been eating much better, and has eaten all of his last meals.   The pt notes that he has not seen a Urologist in several years.   Lab results today (04/18/18) of CBC w/diff and CMP is as follows: all values are WNL except for WBC at 31.1k, RBC at 2.85, HGB at 8.3, HCT at 25.6, RDW at 16.0, PLT at 541k, ANC at 26.5k, Monocytes abs at 2.1k, Abs immature granulocytes at 0.91k, Potassium at 3.3, CO2 at 21, Glucose at 168, BUN at 24, Creatinine at 1.38, Calcium at 8.1, Total Protein at 6.0, Albumin at 2.7, AST at 47 ALT at 58.  On review of systems, pt reports eating well, feeling well, moving his bowels well, and denies confusion, problems with the catheter, abdominal pains, leg swelling, and any other symptoms.   OBJECTIVE:  NAD  PHYSICAL EXAMINATION: . Vitals:   04/18/18 0600 04/18/18 0700 04/18/18 0800 04/18/18 0900  BP: 115/61 (!) 108/59 118/72   Pulse:      Resp: (!) 21 (!) _0 Temp:   (!) 97.2 F (36.2 C)   TempSrc:   Oral   SpO2:      Weight:      Height:       Filed Weights   04/16/18 1434  Weight: 245 lb 11.2 oz (111.4 kg)   .Body mass index is 33.32 kg/m.  GENERAL:alert, in no acute distress and comfortable SKIN: no acute rashes, no significant lesions EYES: conjunctiva are pink and non-injected, sclera anicteric OROPHARYNX: MMM, no exudates, no oropharyngeal erythema or ulceration NECK: supple, no JVD LYMPH:  no palpable lymphadenopathy in the cervical, axillary or inguinal regions LUNGS: clear to auscultation b/l with normal  respiratory effort HEART: regular rate & rhythm ABDOMEN:  normoactive bowel sounds , non tender, not distended. No palpable hepatosplenomegaly. Foley's catheter in situ  Extremity: no pedal edema PSYCH: alert & oriented x 3 with fluent speech NEURO: no focal motor/sensory deficits   MEDICAL HISTORY:  Past Medical History:  Diagnosis Date  . Allergy   . History of kidney stones   . Hyperlipidemia   . Hypertension   . Lymphadenopathy   . Pain, lower leg    Bilateral  . Peripheral arterial disease ([place])   . Pre-diabetes   . Red-green color blindness   . Snores   . Wears glasses     SURGICAL HISTORY: Past Surgical History:  Procedure Laterality Date  . CATARACT EXTRACTION W/ INTRAOCULAR LENS  IMPLANT, BILATERAL    . COLONOSCOPY    . dislodged salava stone    . FRACTURE SURGERY    . HAND ARTHROPLASTY  1995   crushed left hand  . INGUINAL LYMPH NODE BIOPSY Left 01/02/2018   Procedure: LEFT INGUINAL LYMPH NODE BIOPSY;  Surgeon: Rolm Bookbinder, MD;  Location: [place];  Service: General;  Laterality: Left;  . IR IMAGING GUIDED PORT INSERTION  01/15/2018  . MICROLARYNGOSCOPY Left 01/17/2014   Procedure: MICROLARYNGOSCOPY WITH EXCISION OF THE BIOPSY OF LEFT VOCAL CORD LESION;  Surgeon: Izora Gala, MD;  Location: [place];  Service: ENT;  Laterality: Left;  . ORIF FOOT FRACTURE  2005   left    SOCIAL HISTORY: Social History   Socioeconomic History  . Marital status: Divorced    Spouse name: Not on file  . Number of children: 3  . Years of education: Not on file  . Highest education level: Not on file  Occupational History  . Not on file  Social Needs  . Financial resource strain: Not on file  . Food insecurity:    Worry: Not on file    Inability: Not on file  . Transportation needs:    Medical: Not on file    Non-medical: Not on file  Tobacco Use  . Smoking status: Current Every Day Smoker    Packs/day: 0.50    Years: 36.00    Pack years: 18.00     Types: Cigarettes  . Smokeless tobacco: Never Used  Substance and Sexual Activity  . Alcohol use: Yes    Alcohol/week: 15.0 standard drinks    Types: 10 Cans of beer, 5 Shots of liquor per week    Comment: weekends  . Drug use: Yes    Types: Cocaine    Comment: reports cocaine usage ~2X/ month; last use 12/26/17  . Sexual activity: Not on file  Lifestyle  . Physical activity:    Days per week: Not on file    Minutes per session: Not on file  . Stress: Not on file  Relationships  . Social connections:    Talks on phone: Not on file    Gets together: Not on file    Attends religious service: Not on file    Active member of club or organization: Not on file    Attends meetings of clubs or organizations: Not on file    Relationship status: Not on file  . Intimate partner violence:    Fear of current or ex partner: Not on file    Emotionally abused: Not on file    Physically abused: Not on file    Forced sexual activity: Not on file  Other Topics Concern  . Not on file  Social History Narrative  . Not on file    FAMILY HISTORY: Family History  Problem Relation Age of Onset  . Breast cancer Mother   . Diabetes Father   . Hypertension Father   . Stroke Father   . Mental illness Sister   . Hypertension Daughter   . Mental illness Daughter   . Hypertension Brother   . Colon cancer Brother   . Breast cancer Sister     ALLERGIES:  is allergic to bee venom.  MEDICATIONS:  Scheduled Meds: . sodium chloride  250 mL Intravenous Once  . acetaminophen  650 mg Oral Once  . Chlorhexidine Gluconate Cloth  6 each Topical Daily  . diphenhydrAMINE  25 mg Oral Once  . potassium chloride  40 mEq Oral Q4H  . sodium chloride flush  10-40 mL Intracatheter Q12H   Continuous Infusions: . sodium chloride Stopped (04/17/18 1026)  . sodium chloride 100 mL/hr at 04/17/18 2356  . cefTRIAXone (ROCEPHIN)  IV Stopped (04/17/18 1755)   PRN Meds:.sodium chloride, heparin lock flush, heparin  lock flush, heparin lock flush, heparin lock flush, hydrALAZINE, hydrocortisone, ondansetron (ZOFRAN) IV, oxyCODONE, pramipexole, sodium chloride flush, sodium chloride flush, sodium chloride flush, sodium chloride flush  REVIEW OF SYSTEMS:    A 10+ POINT REVIEW OF SYSTEMS WAS  OBTAINED including neurology, dermatology, psychiatry, cardiac, respiratory, lymph, extremities, GI, GU, Musculoskeletal, constitutional, breasts, reproductive, HEENT.  All pertinent positives are noted in the HPI.  All others are negative.   LABORATORY DATA:  I have reviewed the data as listed  . CBC Latest Ref Rng & Units 04/18/2018 04/17/2018 04/16/2018  WBC 4.0 - 10.5 K/uL 31.1(H) 35.7(H) 17.3(H)  Hemoglobin 13.0 - 17.0 g/dL 8.3(L) 7.9(L) 6.7(LL)  Hematocrit 39.0 - 52.0 % 25.6(L) 24.4(L) 21.1(L)  Platelets 150 - 400 K/uL 541(H) 469(H) 650(H)    . CMP Latest Ref Rng & Units 04/18/2018 04/17/2018 04/16/2018  Glucose 70 - 99 mg/dL 168(H) 213(H) 151(H)  BUN 8 - 23 mg/dL 24(H) 24(H) 24(H)  Creatinine 0.61 - 1.24 mg/dL 1.38(H) 1.92(H) 1.68(H)  Sodium 135 - 145 mmol/L 136 136 135  Potassium 3.5 - 5.1 mmol/L 3.3(L) 4.2 3.5  Chloride 98 - 111 mmol/L 103 102 96(L)  CO2 22 - 32 mmol/L 21(L) 22 26  Calcium 8.9 - 10.3 mg/dL 8.1(L) 7.9(L) 8.9  Total Protein 6.5 - 8.1 g/dL 6.0(L) 5.9(L) 6.7  Total Bilirubin 0.3 - 1.2 mg/dL 0.5 1.3(H) 1.1  Alkaline Phos 38 - 126 U/L 100 112 106  AST 15 - 41 U/L 47(H) 107(H) 127(H)  ALT 0 - 44 U/L 58(H) 78(H) 86(H)     RADIOGRAPHIC STUDIES: I have personally reviewed the radiological images as listed and agreed with the findings in the report. Ct Head Wo Contrast  Result Date: 04/17/2018 CLINICAL DATA:  Altered mental status, fever. History of lymphoma, hypertension and hyperlipidemia. EXAM: CT HEAD WITHOUT CONTRAST TECHNIQUE: Contiguous axial images were obtained from the base of the skull through the vertex without intravenous contrast. COMPARISON:  None. FINDINGS: BRAIN: No  intraparenchymal hemorrhage, mass effect nor midline shift. Subtle bilateral mesial caudothalamic hypodensities. The ventricles and sulci are normal for age. Patchy supratentorial white matter hypodensities. No acute large vascular territory infarcts. No abnormal extra-axial fluid collections. Basal cisterns are patent. VASCULAR: Unremarkable. SKULL: No skull fracture. No significant scalp soft tissue swelling. SINUSES/ORBITS: Trace paranasal sinus mucosal thickening. Mastoid air cells are well aerated.The included ocular globes and orbital contents are non-suspicious. Status post bilateral ocular lens implants. OTHER: None. IMPRESSION: 1. Subtle mesial caudothalamic hypodensities may be artifact though, the could reflect encephalitis or Wernicke's encephalopathy. Consider MRI of the head with and without contrast. 2. Mild chronic small vessel ischemic changes. Electronically Signed   By: Elon Alas M.D.   On: 04/17/2018 17:29   Dg Chest Port 1 View  Result Date: 04/16/2018 CLINICAL DATA:  Shortness of breath, hypertension EXAM: PORTABLE CHEST 1 VIEW COMPARISON:  Portable exam 1620 hours compared to 04/04/2006 FINDINGS: RIGHT jugular Port-A-Cath with tip projecting over SVC near cavoatrial junction. Upper normal size of cardiac silhouette with slight vascular congestion. Mediastinal contours normal. Lungs clear. No infiltrate, pleural effusion or pneumothorax. IMPRESSION: No definite acute abnormalities. Electronically Signed   By: Lavonia Dana M.D.   On: 04/16/2018 17:01   Dg Fluoro Guide Lumbar Puncture  Result Date: 03/29/2018 CLINICAL DATA:  B-cell lymphoma, diagnostic lumbar puncture and intrathecal methotrexate administration EXAM: DIAGNOSTIC LUMBAR PUNCTURE ATTEMPT UNDER FLUOROSCOPIC GUIDANCE FLUOROSCOPY TIME:  Fluoroscopy Time:  1 minutes, 46 seconds Radiation Exposure Index (if provided by the fluoroscopic device): 19.2 mGy Number of Acquired Spot Images: 0 PROCEDURE: I discussed the risks  (including hemorrhage, infection, headache, meningitis, and nerve damage, among others), benefits, and alternatives to fluoroscopically guided lumbar puncture with the patient. We discussed the high probability of success of  the procedure. The patient understood and elected to undergo the procedure. Standard time-out was employed. Following sterile skin prep and local anesthetic administration consisting of 1 percent lidocaine, a 22 gauge spinal needle was advanced into the spinal canal at the L3-4 level. With repositionings, a small amount of CSF was observed in the needle hub. The CSF pressure seemed to be low and would not flow with the patient prone, even with the head of the table tilted up to 15 degrees. I turned the patient onto his side in order to attempt to increase the CSF flow by reducing the hydrostatic effects of the needle orientation. This was not successful in causing the CSF to flow. Lateral imaging was obtained showing the needle in the correct position along the spinal canal. The needle was slowly withdrawn from the midline in the spinal canal without any CSF flow. At this point I considered the possibility of prominent epidural adipose tissue or some other cause for the lack of flow in addition to the low pressures. There is certainly some spinal stenosis in the lumbar spine based on prior imaging. Accordingly, I re-prepped and draped the patient and administered local anesthetic, and advanced the needle in the midline at the L2-3 level into the spinal canal. Once again, I obtained a tiny amount of CSF in the hub of the new needle, but no adequate CSF flow. The CSF pressure seemed low. We tilted the table again, and repositionings and rotations of the needle performed and confirmed to be in the appropriate location at fluoroscopy. However, sustained flow could not be obtained and accordingly I could not be confident that medication administration via the spinal needle would definitely into the  thecal sac. At this point, having tried 2 adjacent levels, the attempt at lumbar puncture was stopped. The skin was cleansed and bandaged. IMPRESSION: 1. Unsuccessful fluoroscopically guided lumbar puncture. Needle positioning at the 2 levels (L2-3 and L3-4) attempted was correct based on frontal and lateral fluoroscopic images, and a small amount of CSF was noted in the needle hubs at both levels, but actual flow of CSF through the needle could not be sustained despite turning the patient lateral, tilting the table up, and needle repositioning at each level. The lack of CSF flow meant I could be certain that the needle was in the thecal sac as opposed to in the epidural space, and accordingly the methotrexate could not be administered. 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 I would suggest aggressive hydration prior to any repeated attempts. Based on the appearance of spinal stenosis, I would also recommend avoiding the L4-5 level, as I did. Electronically Signed   By: Van Clines M.D.   On: 03/29/2018 13:31    ASSESSMENT & PLAN:  62 y.o. 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  CT of the abdomen and pelvis performed on 12/22/2017 showed bulky left inguinal, left hemipelvic, and retroperitoneal adenopathy.   01/02/18 Left inguinal LN Biopsy revealed T-Cell/histocyte rich Large B-Cell Lymphoma  12/27/17 ECHO revealed LV EF of 55-60%   01/05/18 PET/CT revealedMassively enlarged pelvic lymph nodes intense metabolic activity consistent lymphoma. 2. Additional hypermetabolic lymph nodes in the porta hepatis and retroperitoneum LEFT aorta. 3.  Solitary hypermetabolic mediastinal lymph node in the upper LEFT  Mediastinum. 4. 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 5. Normal spleen.  HIV non reactive on 12/22/2017.Hep C and hep B serology negative.  03/14/18 PET/CT revealedPET-CT findings suggest an excellent response to chemotherapy. The abdominal lymphadenopathy has near completely resolved and demonstrates a near complete metabolic response. The pelvic and inguinal adenopathy has significantly decreased in size and the metabolic activity has significantly decreased. 2. Diffuse marrow activity likely due to chemotherapy and or marrow stimulating drugs. I do not see any discrete persistent lesions.   2) left lower extremity swelling- now resolved Doppler ultrasound for DVT was negative in the left lower extremity.  Likely from venous compression +/- lymphatic obstruction from bulky left inguinal, left hemipelvic, and retroperitoneal adenopathy.   3) Intermediate to high risk for tumor lysis syndrome.- no TLS noted with allopurinol prophylaxis after C1. Off allopurinol.  4) S/p Port a cath placement   5)h/oE.coli UTI - Pansensitive -Resolved.Also appearedto have BPH like symptomatology.  6) E.COLi sepsis - likely from urinary source. Recent h/o E.coli UTI Elevated procalcitonin at nearly 20 -CXR - NAI No port a cath site erythema /redness.  7) Symptomatic Anemia Hgb 6.7 - due to chemotherapy/sepsis- upt o 7.9 s/p PRBC transfusion. No overt evidence of bleeding.  8) Dehydration - AKI creatinine 1.92 Poor po intake + Sepsis. Cannot r/o urinary obstruction from BPH  9) Elevated transaminases nl ALK PO4 and bilirubin - likely from sepsis. Improving  10) AMS - likely from delirium due to sepsis, dehydration. CT head -pending  PLAN: -Discussed pt labwork today, 04/18/18; WBC improved to 31.1k, HGB increased to 8.3, Potassium a little low at 3.3, Creatinine improved  to 1.38, AST improved to 47, ALT improved to 58 -Will order one additional unit of PRBCs -Continue eating well, staying hydrated -Will continue IV antibiotics for another 1-2 days, awaiting sensitivities for E.coli and enterobacter species  -Will continue PO antibiotics after discharge   -Discussed that after the pt is finished with chemotherapy treatment, it will be beneficial to have him see urology for evaluation of his prostate and recurrent UTIs  -04/18/18 Procalcitonin decreased to 14.58 -04/17/18 Procalcitonin at 19.25 -04/16/18 Blood culture ID revealed E.coli - awaiting sensitivities. Likely urinary source. If Fluoroquinolone sensitive will need to discharge on levofloxacin - given concern for recurrent infection concern for prostatitis vs recurrent risk from urinary obstruction - will likely need extended course of Abx 2-3 week. -okay to consider removing foleys catheter today and doing voiding trial and bladder scan for post void residuals -04/17/18 CT Head results are pending  -hold chemotherapy consideration at this time. -continue zosyn . Given proven E.coli sepsis like discontinue vancomycin  -appreciate excellent care by hospitalist team.  10) DVT prophylaxis -lovenox  11) HTN -hold anti HTN  -could restart flomax if BP okay.  The total time spent in the appt was 25 minutes and more than 50% was on counseling and direct patient cares.     Sullivan Lone MD MS AAHIVMS [facility] Hematology/Oncology Physician [facility]  (Office):       [phone] (Work cell):  [phone] (Fax):           [phone]  04/18/2018 10:55 AM   I, Baldwin [place], am acting as a scribe for Dr. Sullivan Lone.   .I have reviewed the above documentation for accuracy and completeness, and I agree with the above. Sullivan Lone MD MS

## 2018-04-19 DIAGNOSIS — R350 Frequency of micturition: Secondary | ICD-10-CM

## 2018-04-19 DIAGNOSIS — Z91030 Bee allergy status: Secondary | ICD-10-CM

## 2018-04-19 DIAGNOSIS — Z79899 Other long term (current) drug therapy: Secondary | ICD-10-CM

## 2018-04-19 DIAGNOSIS — C8336 Diffuse large B-cell lymphoma, intrapelvic lymph nodes: Secondary | ICD-10-CM

## 2018-04-19 DIAGNOSIS — F17210 Nicotine dependence, cigarettes, uncomplicated: Secondary | ICD-10-CM

## 2018-04-19 DIAGNOSIS — Z95828 Presence of other vascular implants and grafts: Secondary | ICD-10-CM

## 2018-04-19 DIAGNOSIS — G629 Polyneuropathy, unspecified: Secondary | ICD-10-CM

## 2018-04-19 LAB — TYPE AND SCREEN
ABO/RH(D): A POS
Antibody Screen: NEGATIVE
Unit division: 0
Unit division: 0
Unit division: 0

## 2018-04-19 LAB — BPAM RBC
Blood Product Expiration Date: 201912302359
Blood Product Expiration Date: 201912312359
Blood Product Expiration Date: 202001032359
ISSUE DATE / TIME: 201912092344
ISSUE DATE / TIME: 201912100231
ISSUE DATE / TIME: 201912111112
Unit Type and Rh: 6200
Unit Type and Rh: 6200
Unit Type and Rh: 6200

## 2018-04-19 LAB — CBC WITH DIFFERENTIAL/PLATELET
Abs Immature Granulocytes: 0.47 10*3/uL — ABNORMAL HIGH (ref 0.00–0.07)
Basophils Absolute: 0.1 10*3/uL (ref 0.0–0.1)
Basophils Relative: 0 %
Eosinophils Absolute: 0.1 10*3/uL (ref 0.0–0.5)
Eosinophils Relative: 1 %
HCT: 27.5 % — ABNORMAL LOW (ref 39.0–52.0)
Hemoglobin: 8.7 g/dL — ABNORMAL LOW (ref 13.0–17.0)
Immature Granulocytes: 2 %
Lymphocytes Relative: 7 %
Lymphs Abs: 1.4 10*3/uL (ref 0.7–4.0)
MCH: 28.6 pg (ref 26.0–34.0)
MCHC: 31.6 g/dL (ref 30.0–36.0)
MCV: 90.5 fL (ref 80.0–100.0)
Monocytes Absolute: 1.9 10*3/uL — ABNORMAL HIGH (ref 0.1–1.0)
Monocytes Relative: 10 %
Neutro Abs: 16 10*3/uL — ABNORMAL HIGH (ref 1.7–7.7)
Neutrophils Relative %: 80 %
Platelets: 525 10*3/uL — ABNORMAL HIGH (ref 150–400)
RBC: 3.04 MIL/uL — ABNORMAL LOW (ref 4.22–5.81)
RDW: 16.7 % — ABNORMAL HIGH (ref 11.5–15.5)
WBC: 19.9 10*3/uL — ABNORMAL HIGH (ref 4.0–10.5)
nRBC: 0 % (ref 0.0–0.2)

## 2018-04-19 LAB — COMPREHENSIVE METABOLIC PANEL
AST: 23 U/L (ref 15–41)
Albumin: 2.8 g/dL — ABNORMAL LOW (ref 3.5–5.0)
Alkaline Phosphatase: 92 U/L (ref 38–126)
Anion gap: 8 (ref 5–15)
BUN: 15 mg/dL (ref 8–23)
CO2: 23 mmol/L (ref 22–32)
Calcium: 8.7 mg/dL — ABNORMAL LOW (ref 8.9–10.3)
Chloride: 109 mmol/L (ref 98–111)
Creatinine, Ser: 1.11 mg/dL (ref 0.61–1.24)
GFR calc Af Amer: >60 mL/min (ref >60)
GFR calc non Af Amer: >60 mL/min (ref >60)
Glucose, Bld: 132 mg/dL — ABNORMAL HIGH (ref 70–99)
Potassium: 4 mmol/L (ref 3.5–5.1)
Sodium: 140 mmol/L (ref 135–145)
Total Bilirubin: 0.8 mg/dL (ref 0.3–1.2)
Total Protein: 5.8 g/dL — ABNORMAL LOW (ref 6.5–8.1)

## 2018-04-19 LAB — CULTURE, BLOOD (ROUTINE X 2)
Special Requests: ADEQUATE
Special Requests: ADEQUATE

## 2018-04-19 LAB — MAGNESIUM: Magnesium: 1.8 mg/dL (ref 1.7–2.4)

## 2018-04-19 LAB — COMPREHENSIVE METABOLIC PANEL WITH GFR: ALT: 42 U/L (ref 0–44)

## 2018-04-19 NOTE — Progress Notes (Signed)
HEMATOLOGY/ONCOLOGY INPATIENT PROGRESS NOTE  Date of Service: 04/19/2018  Inpatient Attending: .Modena Jansky, MD   SUBJECTIVE:   Jesus [name] is accompanied today by his daughter at bedside. [name].   The pt reports that he has been having difficulty controlling his bladder, but endorses a strong stream and does not force the urine out. The pt notes that he feels that his bladder is completely empty after he finishes urinating, and his urine is now completely clear. The pt denies injuries to his urethra.  The pt notes that he is eating well and continues feeling well overall, however he does not feel that he has completely bounced back to his baseline, mostly due to his urinary symptoms.   Lab results today (04/19/18) of CBC w/diff and CMP is as follows: all values are WNL except for WBC at 19.9k, RBC at 3.04, HGB at 8.7, HCT at 27.5, RDW at 16.7, PLT at 525k, ANC at 16k, Monocytes abs at 1.9k, Abs immature granulocytes at 0.47k, Glucose at 132, Calcium at 8.7, Total Protein at 5.8, Albumin at 2.8. 04/19/18 Magnesium is at 1.8  On review of systems, pt reports difficulty controlling his urine, eating well, staying hydrated, good urinary stream, strong appetite, and denies fevers, chills, night sweats, painful urination, abdominal pains, leg swelling, and any other symptoms.   OBJECTIVE:  NAD  PHYSICAL EXAMINATION: . Vitals:   04/18/18 2119 04/18/18 2356 04/19/18 0412 04/19/18 1400  BP: 118/70  129/77 123/70  Pulse: 92  93 89  Resp: 18  18 18   Temp: 99.5 F (37.5 C) 99 F (37.2 C) 98.2 F (36.8 C) 98.7 F (37.1 C)  TempSrc: Oral Oral Oral Oral  SpO2: 96%  95% 97%  Weight:      Height:       Filed Weights   04/16/18 1434  Weight: 245 lb 11.2 oz (111.4 kg)   .Body mass index is 33.32 kg/m.  GENERAL:alert, in no acute distress and comfortable SKIN: no acute rashes, no significant lesions EYES: conjunctiva are pink and  non-injected, sclera anicteric OROPHARYNX: MMM, no exudates, no oropharyngeal erythema or ulceration NECK: supple, no JVD LYMPH:  no palpable lymphadenopathy in the cervical, axillary or inguinal regions LUNGS: clear to auscultation b/l with normal respiratory effort HEART: regular rate & rhythm ABDOMEN:  normoactive bowel sounds , non tender, not distended. No palpable hepatosplenomegaly. Foley's catheter in situ Extremity: no pedal edema PSYCH: alert & oriented x 3 with fluent speech NEURO: no focal motor/sensory deficits   MEDICAL HISTORY:  Past Medical History:  Diagnosis Date  . Allergy   . History of kidney stones   . Hyperlipidemia   . Hypertension   . Lymphadenopathy   . Pain, lower leg    Bilateral  . Peripheral arterial disease ([place])   . Pre-diabetes   . Red-green color blindness   . Snores   . Wears glasses     SURGICAL HISTORY: Past Surgical History:  Procedure Laterality Date  . CATARACT EXTRACTION W/ INTRAOCULAR LENS  IMPLANT, BILATERAL    . COLONOSCOPY    . dislodged salava stone    . FRACTURE SURGERY    . HAND ARTHROPLASTY  1995   crushed left hand  . INGUINAL LYMPH NODE BIOPSY Left 01/02/2018   Procedure: LEFT INGUINAL LYMPH NODE BIOPSY;  Surgeon: Rolm Bookbinder, MD;  Location: [facility];  Service: General;  Laterality: Left;  . IR IMAGING GUIDED PORT INSERTION  01/15/2018  .  MICROLARYNGOSCOPY Left 01/17/2014   Procedure: MICROLARYNGOSCOPY WITH EXCISION OF THE BIOPSY OF LEFT VOCAL CORD LESION;  Surgeon: Izora Gala, MD;  Location: [facility] [place] [facility];  Service: ENT;  Laterality: Left;  . ORIF FOOT FRACTURE  2005   left    SOCIAL HISTORY: Social History   Socioeconomic History  . Marital status: Divorced    Spouse name: Not on file  . Number of children: 3  . Years of education: Not on file  . Highest education level: Not on file  Occupational History  . Not on file  Social Needs  . Financial resource strain: Not on file  . Food insecurity:     Worry: Not on file    Inability: Not on file  . Transportation needs:    Medical: Not on file    Non-medical: Not on file  Tobacco Use  . Smoking status: Current Every Day Smoker    Packs/day: 0.50    Years: 36.00    Pack years: 18.00    Types: Cigarettes  . Smokeless tobacco: Never Used  Substance and Sexual Activity  . Alcohol use: Yes    Alcohol/week: 15.0 standard drinks    Types: 10 Cans of beer, 5 Shots of liquor per week    Comment: weekends  . Drug use: Yes    Types: Cocaine    Comment: reports cocaine usage ~2X/ month; last use 12/26/17  . Sexual activity: Not on file  Lifestyle  . Physical activity:    Days per week: Not on file    Minutes per session: Not on file  . Stress: Not on file  Relationships  . Social connections:    Talks on phone: Not on file    Gets together: Not on file    Attends religious service: Not on file    Active member of club or organization: Not on file    Attends meetings of clubs or organizations: Not on file    Relationship status: Not on file  . Intimate partner violence:    Fear of current or ex partner: Not on file    Emotionally abused: Not on file    Physically abused: Not on file    Forced sexual activity: Not on file  Other Topics Concern  . Not on file  Social History Narrative  . Not on file    FAMILY HISTORY: Family History  Problem Relation Age of Onset  . Breast cancer Mother   . Diabetes Father   . Hypertension Father   . Stroke Father   . Mental illness Sister   . Hypertension Daughter   . Mental illness Daughter   . Hypertension Brother   . Colon cancer Brother   . Breast cancer Sister     ALLERGIES:  is allergic to bee venom.  MEDICATIONS:  Scheduled Meds: . sodium chloride flush  10-40 mL Intracatheter Q12H   Continuous Infusions: . sodium chloride Stopped (04/17/18 1026)  . cefTRIAXone (ROCEPHIN)  IV 2 g (04/19/18 1625)   PRN Meds:.sodium chloride, heparin lock flush, heparin lock flush,  heparin lock flush, heparin lock flush, hydrALAZINE, hydrocortisone, ondansetron (ZOFRAN) IV, oxyCODONE, pramipexole, sodium chloride flush, sodium chloride flush, sodium chloride flush, sodium chloride flush  REVIEW OF SYSTEMS:    A 10+ POINT REVIEW OF SYSTEMS WAS OBTAINED including neurology, dermatology, psychiatry, cardiac, respiratory, lymph, extremities, GI, GU, Musculoskeletal, constitutional, breasts, reproductive, HEENT.  All pertinent positives are noted in the HPI.  All others are negative.   LABORATORY DATA:  I have reviewed the data as listed  . CBC Latest Ref Rng & Units 04/19/2018 04/18/2018 04/18/2018  WBC 4.0 - 10.5 K/uL 19.9(H) 20.5(H) 31.1(H)  Hemoglobin 13.0 - 17.0 g/dL 8.7(L) 8.4(L) 8.3(L)  Hematocrit 39.0 - 52.0 % 27.5(L) 26.7(L) 25.6(L)  Platelets 150 - 400 K/uL 525(H) 451(H) 541(H)    . CMP Latest Ref Rng & Units 04/19/2018 04/18/2018 04/17/2018  Glucose 70 - 99 mg/dL 132(H) 168(H) 213(H)  BUN 8 - 23 mg/dL 15 24(H) 24(H)  Creatinine 0.61 - 1.24 mg/dL 1.11 1.38(H) 1.92(H)  Sodium 135 - 145 mmol/L 140 136 136  Potassium 3.5 - 5.1 mmol/L 4.0 3.3(L) 4.2  Chloride 98 - 111 mmol/L 109 103 102  CO2 22 - 32 mmol/L 23 21(L) 22  Calcium 8.9 - 10.3 mg/dL 8.7(L) 8.1(L) 7.9(L)  Total Protein 6.5 - 8.1 g/dL 5.8(L) 6.0(L) 5.9(L)  Total Bilirubin 0.3 - 1.2 mg/dL 0.8 0.5 1.3(H)  Alkaline Phos 38 - 126 U/L 92 100 112  AST 15 - 41 U/L 23 47(H) 107(H)  ALT 0 - 44 U/L 42 58(H) 78(H)     RADIOGRAPHIC STUDIES: I have personally reviewed the radiological images as listed and agreed with the findings in the report. Ct Head Wo Contrast  Result Date: 04/17/2018 CLINICAL DATA:  Altered mental status, fever. History of lymphoma, hypertension and hyperlipidemia. EXAM: CT HEAD WITHOUT CONTRAST TECHNIQUE: Contiguous axial images were obtained from the base of the skull through the vertex without intravenous contrast. COMPARISON:  None. FINDINGS: BRAIN: No intraparenchymal hemorrhage,  mass effect nor midline shift. Subtle bilateral mesial caudothalamic hypodensities. The ventricles and sulci are normal for age. Patchy supratentorial white matter hypodensities. No acute large vascular territory infarcts. No abnormal extra-axial fluid collections. Basal cisterns are patent. VASCULAR: Unremarkable. SKULL: No skull fracture. No significant scalp soft tissue swelling. SINUSES/ORBITS: Trace paranasal sinus mucosal thickening. Mastoid air cells are well aerated.The included ocular globes and orbital contents are non-suspicious. Status post bilateral ocular lens implants. OTHER: None. IMPRESSION: 1. Subtle mesial caudothalamic hypodensities may be artifact though, the could reflect encephalitis or Wernicke's encephalopathy. Consider MRI of the head with and without contrast. 2. Mild chronic small vessel ischemic changes. Electronically Signed   By: Elon Alas M.D.   On: 04/17/2018 17:29   Dg Chest Port 1 View  Result Date: 04/16/2018 CLINICAL DATA:  Shortness of breath, hypertension EXAM: PORTABLE CHEST 1 VIEW COMPARISON:  Portable exam 1620 hours compared to 04/04/2006 FINDINGS: RIGHT jugular Port-A-Cath with tip projecting over SVC near cavoatrial junction. Upper normal size of cardiac silhouette with slight vascular congestion. Mediastinal contours normal. Lungs clear. No infiltrate, pleural effusion or pneumothorax. IMPRESSION: No definite acute abnormalities. Electronically Signed   By: Lavonia Dana M.D.   On: 04/16/2018 17:01   Dg Fluoro Guide Lumbar Puncture  Result Date: 03/29/2018 CLINICAL DATA:  B-cell lymphoma, diagnostic lumbar puncture and intrathecal methotrexate administration EXAM: DIAGNOSTIC LUMBAR PUNCTURE ATTEMPT UNDER FLUOROSCOPIC GUIDANCE FLUOROSCOPY TIME:  Fluoroscopy Time:  1 minutes, 46 seconds Radiation Exposure Index (if provided by the fluoroscopic device): 19.2 mGy Number of Acquired Spot Images: 0 PROCEDURE: I discussed the risks (including hemorrhage,  infection, headache, meningitis, and nerve damage, among others), benefits, and alternatives to fluoroscopically guided lumbar puncture with the patient. We discussed the high probability of success of the procedure. The patient understood and elected to undergo the procedure. Standard time-out was employed. Following sterile skin prep and local anesthetic administration consisting of 1 percent lidocaine, a 22 gauge spinal needle was advanced  into the spinal canal at the L3-4 level. With repositionings, a small amount of CSF was observed in the needle hub. The CSF pressure seemed to be low and would not flow with the patient prone, even with the head of the table tilted up to 15 degrees. I turned the patient onto his side in order to attempt to increase the CSF flow by reducing the hydrostatic effects of the needle orientation. This was not successful in causing the CSF to flow. Lateral imaging was obtained showing the needle in the correct position along the spinal canal. The needle was slowly withdrawn from the midline in the spinal canal without any CSF flow. At this point I considered the possibility of prominent epidural adipose tissue or some other cause for the lack of flow in addition to the low pressures. There is certainly some spinal stenosis in the lumbar spine based on prior imaging. Accordingly, I re-prepped and draped the patient and administered local anesthetic, and advanced the needle in the midline at the L2-3 level into the spinal canal. Once again, I obtained a tiny amount of CSF in the hub of the new needle, but no adequate CSF flow. The CSF pressure seemed low. We tilted the table again, and repositionings and rotations of the needle performed and confirmed to be in the appropriate location at fluoroscopy. However, sustained flow could not be obtained and accordingly I could not be confident that medication administration via the spinal needle would definitely into the thecal sac. At this  point, having tried 2 adjacent levels, the attempt at lumbar puncture was stopped. The skin was cleansed and bandaged. IMPRESSION: 1. Unsuccessful fluoroscopically guided lumbar puncture. Needle positioning at the 2 levels (L2-3 and L3-4) attempted was correct based on frontal and lateral fluoroscopic images, and a small amount of CSF was noted in the needle hubs at both levels, but actual flow of CSF through the needle could not be sustained despite turning the patient lateral, tilting the table up, and needle repositioning at each level. The lack of CSF flow meant I could be certain that the needle was in the thecal sac as opposed to in the epidural space, and accordingly the methotrexate could not be administered. 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 I would suggest aggressive hydration prior to any repeated attempts. Based on the appearance of spinal stenosis, I would also recommend avoiding the L4-5 level, as I did. Electronically Signed   By: Van Clines M.D.   On: 03/29/2018 13:31    ASSESSMENT & PLAN:  62 y.o. 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  CT of the abdomen and pelvis performed on 12/22/2017 showed bulky left inguinal, left hemipelvic, and retroperitoneal adenopathy.   01/02/18 Left inguinal LN Biopsy revealed T-Cell/histocyte rich Large B-Cell Lymphoma  12/27/17 ECHO revealed LV EF of 55-60%   01/05/18 PET/CT revealedMassively enlarged pelvic lymph nodes intense metabolic activity consistent lymphoma. 2. Additional hypermetabolic lymph nodes in the porta hepatis and retroperitoneum LEFT aorta. 3. Solitary  hypermetabolic mediastinal lymph node in the upper LEFT Mediastinum. 4. 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 5. Normal spleen.  HIV non reactive on 12/22/2017.Hep C and hep B serology negative.  03/14/18 PET/CT revealedPET-CT findings suggest an excellent  response to chemotherapy. The abdominal lymphadenopathy has near completely resolved and demonstrates a near complete metabolic response. The pelvic and inguinal adenopathy has significantly decreased in size and the metabolic activity has significantly decreased. 2. Diffuse marrow activity likely due to chemotherapy and or marrow stimulating drugs. I do not see any discrete persistent lesions.   2) left lower extremity swelling- now resolved Doppler ultrasound for DVT was negative in the left lower extremity.  Likely from venous compression +/- lymphatic obstruction from bulky left inguinal, left hemipelvic, and retroperitoneal adenopathy.   3) Intermediate to high risk for tumor lysis syndrome.- no TLS noted with allopurinol prophylaxis after C1. Off allopurinol.  4) S/p Port a cath placement   5)h/oE.coli UTI - Pansensitive -Resolved.Also appearedto have BPH like symptomatology.  6) E.COLi sepsis - likely from urinary source. Recent h/o E.coli UTI Elevated procalcitonin at nearly 20 -CXR - NAI No port a cath site erythema /redness.  7) Symptomatic Anemia Hgb 6.7 - due to chemotherapy/sepsis- upt o 7.9 s/p PRBC transfusion. No overt evidence of bleeding.  8) Dehydration - AKI creatinine 1.92 Poor po intake + Sepsis. Cannot r/o urinary obstruction from BPH  9) Elevated transaminases nl ALK PO4 and bilirubin - likely from sepsis. Improving  10) AMS - likely from delirium due to sepsis, dehydration. CT head -pending  PLAN: -Discussed pt labwork today, 04/19/18; HGB improved to 8.7, PLT at 525k, ANC down to 16k, Creatinine normalized to 1.11, liver functions  normalized  -Discussed the 04/17/18 CT Head which revealed Subtle mesial caudothalamic hypodensities may be artifact though, the could reflect encephalitis or Wernicke's encephalopathy. Consider MRI of the head with and without contrast. 2. Mild chronic small vessel ischemic changes  -Continue eating well, staying hydrated  -Will continue IV antibiotics for another 1-2 days, awaiting sensitivities for E.coli and enterobacter species  -Will continue PO antibiotics after discharge   -Discussed that after the pt is finished with chemotherapy treatment, it will be beneficial to have him see urology for evaluation of his prostate and recurrent UTIs  -04/18/18 Procalcitonin decreased to 14.58 -04/17/18 Procalcitonin at 19.25 -04/16/18 Blood culture ID revealed E.coli - awaiting sensitivities. Likely urinary source. If Fluoroquinolone sensitive will need to discharge on levofloxacin - given concern for recurrent infection concern for prostatitis vs recurrent risk from urinary obstruction - will likely need extended course of Abx 2-3 week. -okay to consider removing foleys catheter today and doing voiding trial and bladder scan for post void residuals -hold chemotherapy consideration at this time. -continue zosyn . Given proven E.coli sepsis like discontinue vancomycin  -appreciate excellent care by hospitalist team.  10) DVT prophylaxis -lovenox  11) HTN -hold anti HTN  -could restart flomax if BP okay.  The total time spent in the appt was 25 minutes and more than 50% was on counseling and direct patient cares.     Sullivan Lone MD MS AAHIVMS [facility] Hematology/Oncology Physician [facility]  (Office):       [phone] (Work cell):  [phone] (Fax):           [phone]  04/19/2018 4:57 PM   I, Baldwin [place], am acting as a scribe for Dr. Sullivan Lone.   .I have reviewed the above documentation for accuracy and completeness, and I agree with the above. Sullivan Lone  MD MS

## 2018-04-19 NOTE — Consult Note (Signed)
[facility] for Infectious Disease  Total days of antibiotics 4(day 3 of ceftiraxone)               Reason for Consult:e.coli sepsis in immunocompromised host    Referring Physician: [email or web address]  Principal Problem:   Sepsis ([place]) Active Problems:   Diffuse large B cell lymphoma ([place])   Anemia   Acute metabolic encephalopathy   AKI (acute kidney injury) ([place])   HTN (hypertension)   RLS (restless legs syndrome)   Abnormal LFTs    HPI: Jesus [name] is a 62 y.o. 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 He was seen in dr Grier Mitts office for 5th cycle of [facility] but found to that he had new onset fever and AMS and subsequently admitted to the hospital for evaluation. He had 3 days of malaise, anorexia, dark urine, and questionable dysuria. On admit, had WBC of 35.7K,  Left shift. Elevated procalcitonin. Hypotension. Infectious work up revealed pyuria/bacteuria with ecoli bacteremia, pansensitive ecoli urine cx. His exam was unremarkable. No erythema to his indwelling port. He was started on vancomycin and piptazo then narrowed to ceftriaxone. His leukocytosis ins improving as did his fever curve. ID asked to weigh in on management  Past Medical History:  Diagnosis Date  . Allergy   . History of kidney stones   . Hyperlipidemia   . Hypertension   . Lymphadenopathy   . Pain, lower leg    Bilateral  . Peripheral arterial disease ([place])   . Pre-diabetes   . Red-green color blindness   . Snores   . Wears glasses     Allergies:  Allergies  Allergen Reactions  . Bee Venom Anaphylaxis    MEDICATIONS: . sodium chloride flush  10-40 mL Intracatheter Q12H    Social History   Tobacco Use  . Smoking status: Current Every Day Smoker    Packs/day: 0.50    Years: 36.00    Pack years: 18.00    Types: Cigarettes  . Smokeless tobacco:  Never Used  Substance Use Topics  . Alcohol use: Yes    Alcohol/week: 15.0 standard drinks    Types: 10 Cans of beer, 5 Shots of liquor per week    Comment: weekends  . Drug use: Yes    Types: Cocaine    Comment: reports cocaine usage ~2X/ month; last use 12/26/17    Family History  Problem Relation Age of Onset  . Breast cancer Mother   . Diabetes Father   . Hypertension Father   . Stroke Father   . Mental illness Sister   . Hypertension Daughter   . Mental illness Daughter   . Hypertension Brother   . Colon cancer Brother   . Breast cancer Sister      Review of Systems  Constitutional: positive for fever, chills, diaphoresis, activity change, appetite change, fatigue and unexpected weight change.  HENT: Negative for congestion, sore throat, rhinorrhea, sneezing, trouble swallowing and sinus pressure.  Eyes: Negative for photophobia and visual disturbance.  Respiratory: Negative for cough, chest tightness, shortness of breath, wheezing and stridor.  Cardiovascular: Negative for chest pain, palpitations and leg swelling.  Gastrointestinal: Negative for nausea, vomiting, abdominal pain, diarrhea, constipation, blood in stool, abdominal distention and anal bleeding.  Genitourinary: +frequent urination, mild dysuria. No flank pain. Negative for dysuria, hematuria, flank pain and difficulty urinating.  Musculoskeletal: Negative for  myalgias, back pain, joint swelling, arthralgias and gait problem.  Skin: Negative for color change, pallor, rash and wound.  Neurological: Negative for dizziness, tremors, weakness and light-headedness. Loss of taste since starting chemo, peripheral neuropathy Hematological: Negative for adenopathy. Does not bruise/bleed easily.  Psychiatric/Behavioral: Negative for behavioral problems, confusion, sleep disturbance, dysphoric mood, decreased concentration and agitation.     OBJECTIVE: Temp:  [98 F (36.7 C)-99.5 F (37.5 C)] 98.7 F (37.1 C)  (12/12 1400) Pulse Rate:  [79-93] 89 (12/12 1400) Resp:  [18-22] 18 (12/12 1400) BP: (114-129)/(70-77) 123/70 (12/12 1400) SpO2:  [95 %-98 %] 97 % (12/12 1400) Physical Exam  Constitutional: He is oriented to person, place, and time. He appears well-developed and well-nourished. No distress.  HENT:  Mouth/Throat: Oropharynx is clear and moist. No oropharyngeal exudate.  Cardiovascular: Normal rate, regular rhythm and normal heart sounds. Exam reveals no gallop and no friction rub.  No murmur heard.  Chest wall = no tenderness to port Pulmonary/Chest: Effort normal and breath sounds normal. No respiratory distress. He has no wheezes.  Abdominal: Soft. Bowel sounds are normal. He exhibits no distension. There is no tenderness.  Lymphadenopathy:  He has no cervical adenopathy.  Neurological: He is alert and oriented to person, place, and time.  Skin: Skin is warm and dry. No rash noted. No erythema.  Psychiatric: He has a normal mood and affect. His behavior is normal.    LABS: Results for orders placed or performed during the hospital encounter of 04/16/18 (from the past 48 hour(s))  CBC WITH DIFFERENTIAL     Status: Abnormal   Collection Time: 04/18/18  3:12 AM  Result Value Ref Range   WBC 31.1 (H) 4.0 - 10.5 K/uL   RBC 2.85 (L) 4.22 - 5.81 MIL/uL   Hemoglobin 8.3 (L) 13.0 - 17.0 g/dL   HCT 25.6 (L) 39.0 - 52.0 %   MCV 89.8 80.0 - 100.0 fL   MCH 29.1 26.0 - 34.0 pg   MCHC 32.4 30.0 - 36.0 g/dL   RDW 16.0 (H) 11.5 - 15.5 %   Platelets 541 (H) 150 - 400 K/uL   nRBC 0.0 0.0 - 0.2 %   Neutrophils Relative % 85 %   Neutro Abs 26.5 (H) 1.7 - 7.7 K/uL   Lymphocytes Relative 5 %   Lymphs Abs 1.4 0.7 - 4.0 K/uL   Monocytes Relative 7 %   Monocytes Absolute 2.1 (H) 0.1 - 1.0 K/uL   Eosinophils Relative 0 %   Eosinophils Absolute 0.1 0.0 - 0.5 K/uL   Basophils Relative 0 %   Basophils Absolute 0.1 0.0 - 0.1 K/uL   WBC Morphology TOXIC GRANULATION    Immature Granulocytes 3 %    Abs Immature Granulocytes 0.91 (H) 0.00 - 0.07 K/uL    Comment: Performed at [facility], [place] [address], [place], [place] [address]  Comprehensive metabolic panel     Status: Abnormal   Collection Time: 04/18/18  3:12 AM  Result Value Ref Range   Sodium 136 135 - 145 mmol/L   Potassium 3.3 (L) 3.5 - 5.1 mmol/L    Comment: DELTA CHECK NOTED REPEATED TO VERIFY NO VISIBLE HEMOLYSIS    Chloride 103 98 - 111 mmol/L   CO2 21 (L) 22 - 32 mmol/L   Glucose, Bld 168 (H) 70 - 99 mg/dL   BUN 24 (H) 8 - 23 mg/dL   Creatinine, Ser 1.38 (H) 0.61 - 1.24 mg/dL   Calcium 8.1 (L) 8.9 - 10.3  mg/dL   Total Protein 6.0 (L) 6.5 - 8.1 g/dL   Albumin 2.7 (L) 3.5 - 5.0 g/dL   AST 47 (H) 15 - 41 U/L   ALT 58 (H) 0 - 44 U/L   Alkaline Phosphatase 100 38 - 126 U/L   Total Bilirubin 0.5 0.3 - 1.2 mg/dL   GFR calc non Af Amer 54 (L) >60 mL/min   GFR calc Af Amer >60 >60 mL/min   Anion gap 12 5 - 15    Comment: Performed at [facility], [place] [name], [place], [place] [address]  Procalcitonin     Status: None   Collection Time: 04/18/18  3:12 AM  Result Value Ref Range   Procalcitonin 14.58 ng/mL    Comment:        Interpretation: PCT >= 10 ng/mL: Important systemic inflammatory response, almost exclusively due to severe bacterial sepsis or septic shock. (NOTE)       Sepsis PCT Algorithm           Lower Respiratory Tract                                      Infection PCT Algorithm    ----------------------------     ----------------------------         PCT < 0.25 ng/mL                PCT < 0.10 ng/mL         Strongly encourage             Strongly discourage   discontinuation of antibiotics    initiation of antibiotics    ----------------------------     -----------------------------       PCT 0.25 - 0.50 ng/mL            PCT 0.10 - 0.25 ng/mL               OR       >80% decrease in PCT            Discourage initiation of                                             antibiotics      Encourage discontinuation           of antibiotics    ----------------------------     -----------------------------         PCT >= 0.50 ng/mL              PCT 0.26 - 0.50 ng/mL                AND       <80% decrease in PCT             Encourage initiation of                                             antibiotics       Encourage continuation           of antibiotics    ----------------------------     -----------------------------        PCT >= 0.50 ng/mL  PCT > 0.50 ng/mL               AND         increase in PCT                  Strongly encourage                                      initiation of antibiotics    Strongly encourage escalation           of antibiotics                                     -----------------------------                                           PCT <= 0.25 ng/mL                                                 OR                                        > 80% decrease in PCT                                     Discontinue / Do not initiate                                             antibiotics Performed at [facility] [address], [place], [place] [address]   Prepare RBC     Status: None   Collection Time: 04/18/18  9:38 AM  Result Value Ref Range   Order Confirmation      ORDER PROCESSED BY BLOOD BANK Performed at [facility], [place] [address] [place], [place] [address]   Culture, blood (routine x 2)     Status: None (Preliminary result)   Collection Time: 04/18/18  4:04 PM  Result Value Ref Range   Specimen Description      BLOOD LEFT ARM Performed at [facility] [address], [place], [place] [address]    Special Requests      BOTTLES DRAWN AEROBIC AND ANAEROBIC Blood Culture adequate volume Performed at [facility] [address] [place], [place] [address]    Culture      NO GROWTH < 24 HOURS Performed at [place] [address] [place], [place] [address]    Report Status PENDING   Culture, blood (routine x 2)     Status: None (Preliminary result)   Collection Time: 04/18/18  4:10 PM  Result Value Ref Range   Specimen Description      BLOOD RIGHT ARM Performed at [facility]  [address] [place], [place] [address]    Special Requests      BOTTLES DRAWN AEROBIC AND ANAEROBIC Blood Culture adequate volume Performed at [facility] [address] [place], [place] [address]    Culture      NO GROWTH < 24 HOURS Performed at [facility] [address] [place], [place] [address]    Report Status PENDING   CBC     Status: Abnormal   Collection Time: 04/18/18  5:50 PM  Result Value Ref Range   WBC 20.5 (H) 4.0 - 10.5 K/uL   RBC 2.91 (L) 4.22 - 5.81 MIL/uL   Hemoglobin 8.4 (L) 13.0 - 17.0 g/dL   HCT 26.7 (L) 39.0 - 52.0 %   MCV 91.8 80.0 - 100.0 fL   MCH 28.9 26.0 - 34.0 pg   MCHC 31.5 30.0 - 36.0 g/dL   RDW 16.3 (H) 11.5 - 15.5 %   Platelets 451 (H) 150 - 400 K/uL   nRBC 0.0 0.0 - 0.2 %    Comment: Performed at [facility], [place] [address] [place], [place] [address]  CBC WITH DIFFERENTIAL     Status: Abnormal   Collection Time: 04/19/18  4:11 AM  Result Value Ref Range   WBC 19.9 (H) 4.0 - 10.5 K/uL   RBC 3.04 (L) 4.22 - 5.81 MIL/uL   Hemoglobin 8.7 (L) 13.0 - 17.0 g/dL   HCT 27.5 (L) 39.0 - 52.0 %   MCV 90.5 80.0 - 100.0 fL   MCH 28.6 26.0 - 34.0 pg   MCHC 31.6 30.0 - 36.0 g/dL   RDW 16.7 (H) 11.5 - 15.5 %   Platelets 525 (H) 150 - 400 K/uL   nRBC 0.0 0.0 - 0.2 %   Neutrophils Relative % 80 %   Neutro Abs 16.0 (H) 1.7 - 7.7 K/uL   Lymphocytes Relative 7 %   Lymphs Abs 1.4 0.7 - 4.0 K/uL   Monocytes Relative 10 %   Monocytes Absolute 1.9 (H) 0.1 - 1.0 K/uL   Eosinophils Relative 1 %   Eosinophils Absolute 0.1 0.0 - 0.5 K/uL   Basophils Relative 0 %   Basophils Absolute 0.1 0.0 - 0.1 K/uL    Immature Granulocytes 2 %   Abs Immature Granulocytes 0.47 (H) 0.00 - 0.07 K/uL    Comment: Performed at [facility], [place] [address] [place], [place] [address]  Comprehensive metabolic panel     Status: Abnormal   Collection Time: 04/19/18  4:11 AM  Result Value Ref Range   Sodium 140 135 - 145 mmol/L   Potassium 4.0 3.5 - 5.1 mmol/L    Comment: DELTA CHECK NOTED REPEATED TO VERIFY NO VISIBLE HEMOLYSIS    Chloride 109 98 - 111 mmol/L   CO2 23 22 - 32 mmol/L   Glucose, Bld 132 (H) 70 - 99 mg/dL   BUN 15 8 - 23 mg/dL   Creatinine, Ser 1.11 0.61 - 1.24 mg/dL   Calcium 8.7 (L) 8.9 - 10.3 mg/dL   Total Protein 5.8 (L) 6.5 - 8.1 g/dL   Albumin 2.8 (L) 3.5 - 5.0 g/dL   AST 23 15 - 41 U/L   ALT 42 0 - 44 U/L   Alkaline Phosphatase 92 38 - 126 U/L   Total Bilirubin 0.8 0.3 - 1.2 mg/dL   GFR calc non Af Amer >60 >60 mL/min   GFR calc Af Amer >60 >60 mL/min   Anion gap 8 5 -  15    Comment: Performed at [facility], [place] [address], [place], [place] [address]  Magnesium     Status: None   Collection Time: 04/19/18  4:11 AM  Result Value Ref Range   Magnesium 1.8 1.7 - 2.4 mg/dL    Comment: Performed at [facility], [place] [address], [place], [place] [address]    MICRO: 12/9 blood cx ecoli 4/4 12/9 urine cx ecoli (pan sensitive) 12/11 blood cx ngtd  IMAGING: No results found.  Assessment/Plan:  62yo M who is an immunocompromised host with Tcell lymphoma s/p 4 cycles of EPOCH-R admitted for ecoli sepsis thought to be urinary source  Plan to continue on ceftriaxone 2gm IV daily while he is hospitalized. If repeat blood cx are NGTD at 48hr (on 12/13) no need to pull port When ready to discharge, would transition to bactrim DS 1 BID (since it would have good prostate penetration) plan to treat for 14d (currently on day 3) but may need to be extended based upon Repeat urine cx in 10 -14 days.  If discharge tomorrow, ask pharmacy  to give dose early of ceftriaxone prior to discharge then have patient start the remaining 10d of bactrim DS BID on 12/14.  Have patient get BMP next week to check on potassium while on bactrim  tcell lymphoma = EPOCH - R cycle is postponed due to acute illness. Ideally finishes course of treatment before getting next course of chemo

## 2018-04-19 NOTE — Evaluation (Signed)
Physical Therapy Evaluation-1x eval Patient Details Name: Jesus [name] MRN: [id number] DOB: 10-Mar-1956 Today's Date: 04/19/2018   History of Present Illness  62 yo male admitted with sepsis, acute metabolic encephalopathy. hx of lymphoma-on chemo, drug abuse, RLS, PVD  Clinical Impression  On eval, pt was Mod Ind with mobility. He walked ~300 feet without an assistive device. Mildly unsteady at times but no LOB. He tolerated activity well. No acute PT needs. Will sign off. 1x eval.     Follow Up Recommendations No PT follow up    Equipment Recommendations  None recommended by PT    Recommendations for Other Services       Precautions / Restrictions Precautions Precautions: Fall Restrictions Weight Bearing Restrictions: No      Mobility  Bed Mobility Overal bed mobility: Modified Independent                Transfers Overall transfer level: Modified independent                  Ambulation/Gait Ambulation/Gait assistance: Modified independent (Device/Increase time) Gait Distance (Feet): 300 Feet Assistive device: None Gait Pattern/deviations: Wide base of support     General Gait Details: fair gait speed. Mildly unsteady but no LOB. Pt tolerated activity well.   Stairs            Wheelchair Mobility    Modified Rankin (Stroke Patients Only)       Balance Overall balance assessment: Mild deficits observed, not formally tested                                           Pertinent Vitals/Pain Pain Assessment: No/denies pain    Home Living Family/patient expects to be discharged to:: Private residence Living Arrangements: Children Available Help at Discharge: Family Type of Home: House Home Access: Stairs to enter Entrance Stairs-Rails: Right Entrance Stairs-Number of Steps: 3 Home Layout: One level Home Equipment: None      Prior Function Level of Independence: Independent               Hand Dominance         Extremity/Trunk Assessment   Upper Extremity Assessment Upper Extremity Assessment: Overall WFL for tasks assessed    Lower Extremity Assessment Lower Extremity Assessment: Generalized weakness    Cervical / Trunk Assessment Cervical / Trunk Assessment: Normal  Communication   Communication: No difficulties  Cognition Arousal/Alertness: Awake/alert Behavior During Therapy: WFL for tasks assessed/performed Overall Cognitive Status: Within Functional Limits for tasks assessed                                        General Comments      Exercises     Assessment/Plan    PT Assessment Patent does not need any further PT services  PT Problem List         PT Treatment Interventions      PT Goals (Current goals can be found in the Care Plan section)  Acute Rehab PT Goals [name] PT Goal Formulation: All assessment and education complete, DC therapy    Frequency     Barriers to discharge        Co-evaluation  AM-PAC PT "6 Clicks" Mobility  Outcome Measure Help needed turning from your back to your side while in a flat bed without using bedrails?: None Help needed moving from lying on your back to sitting on the side of a flat bed without using bedrails?: None Help needed moving to and from a bed to a chair (including a wheelchair)?: None Help needed standing up from a chair using your arms (e.g., wheelchair or bedside chair)?: None Help needed to walk in hospital room?: None Help needed climbing 3-5 steps with a railing? : A Little 6 Click Score: 23    End of Session Equipment Utilized During Treatment: Gait belt Activity Tolerance: Patient tolerated treatment well Patient left: in bed;with call bell/phone within reach;with family/visitor present        Time: 0321-2248 PT Time Calculation (min) (ACUTE ONLY): 8 min   Charges:   PT Evaluation $PT Eval Low Complexity: [place], PT Acute Rehabilitation Services Pager: [phone] Office: [phone]

## 2018-04-19 NOTE — Progress Notes (Signed)
Patient ID: Jesus [name], male   DOB: 07-09-1955, 62 y.o.   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  He was supposed to have elective chemotherapy but was found to have AMS and fever and subsequently started on broad-spectrum intravenous antibiotics.  He had to be transferred to stepdown unit because of persistent hypotension and altered mental status.  Hemoglobin was found to be 6.7, he was transfused 2 units of packed red cells.  Clinically improved and transferred to telemetry.   Assessment & Plan:   Principal Problem:   Sepsis ([place]) Active Problems:   Diffuse large B cell lymphoma ([facility])   Anemia   Acute metabolic encephalopathy   AKI (acute kidney injury) ([place])   HTN (hypertension)   RLS (restless legs syndrome)   Abnormal LFTs  Sepsis from E. coli bacteremia/UTI -Sepsis resolved.  Hemodynamically stable.  IV fluids discontinued.  Antibiotic management as below.  E. coli bacteremia  - Potential sources: Most likely from urinary tract infection versus?  Acute prostatitis. - Initially started on vancomycin and Zosyn which were narrowed to Rocephin on 04/17/2018.  Both blood and urine culture E. coli are pansensitive. - Probably from UTI versus prostatitis but since the patient has a Port-A-Cath, will repeat blood cultures for tomorrow.  Blood cultures are persistently positive, might have to consider removing Port-A-Cath -Surveillance blood cultures from 12/11: Negative to date. -Requested ID input 12/12 regarding choice and duration of antibiotics. -Strongly recommend outpatient neurology consultation.  Leukocytosis -Continues to improve  Acute kidney injury -Likely due to  combination of dehydration, ACEI, sepsis and acute urinary retention.  -Resolved after IV fluid hydration and holding lisinopril. -Check renal ultrasound.  Acute metabolic encephalopathy -Most likely secondary to sepsis.  Mental status has much improved.  CT of the head showed some hypodensities which might be artifact but could not rule out encephalitis versus Warnicke's encephalopathy.  Mental status changes resolved so holding off on further imaging studies.  Does not look like patient has encephalitis.  Essential hypertension -Controlled off of medications for now.  Monitor  Chronic anemia and thrombocytosis -Most likely secondary to lymphoma -Hemoglobin was 6.7 on presentation.  Status post transfusion of 2 units of packed red cells.  Hemoglobin stable in the mid 8 g range. Oncology consultation 12/11 appreciated.  Abnormal LFTs--probably secondary to sepsis.  Resolved.  Hypokalemia -Replaced  Generalized deconditioning -No PT recommended after evaluation   Stage IV T-cell/histocyte rich large B-cell lymphoma -Oncology following.  Current chemotherapy on hold  Acute urinary retention -Required Foley catheter.  Discontinued Foley catheter on 12/12 and voiding without difficulty.  No significant residuals on bladder scan.   DVT prophylaxis: SCDs Code Status: Full Family Communication: Daughter at bedside Disposition Plan: DC home possibly in the next 24 hours pending clinical improvement.  Consultants:  Oncology ID  Procedures:  Foley catheter-discontinued 12/12  Antimicrobials:  Vancomycin and Zosyn 04/16/2018 Rocephin from 04/17/2018 onwards   Subjective: Reports feeling better.  Feels stronger.  Denies complaints.  Reports having difficulty with urination prior to admission, incomplete voiding, dribbling and low stream.  No dysuria.  States that he had seen a urologist in the past but cannot recollect name.  Reportedly was treated for kidney  stone.  Objective:  Vitals:   04/18/18 2119 04/18/18 2356 04/19/18 0412 04/19/18 1400  BP: 118/70  129/77 123/70  Pulse: 92  93 89  Resp: 18  18 18   Temp: 99.5 F (37.5 C) 99 F (37.2 C) 98.2 F (36.8 C) 98.7 F (37.1 C)  TempSrc: Oral Oral Oral Oral  SpO2: 96%  95% 97%  Weight:      Height:        Intake/Output Summary (Last 24 hours) at 04/19/2018 1544 Last data filed at 04/19/2018 1300 Gross per 24 hour  Intake 460 ml  Output 2500 ml  Net -2040 ml   Filed Weights   04/16/18 1434  Weight: 111.4 kg    Examination:  General exam: Pleasant young male, moderately built and obese lying comfortably supine in bed. Respiratory system: Clear to auscultation.  No increased work of breathing. Cardiovascular system: S1 and S2 heard, RRR.  No JVD, murmurs or pedal edema.  Telemetry personally reviewed and shows sinus rhythm with BBB morphology. Gastrointestinal system: Abdomen is obese, nondistended, soft and nontender. Normal bowel sounds heard.  Stable Extremities: Symmetric 5 x 5 power. Central nervous system: Alert and oriented. No focal neurological deficits.  Stable Skin: No rashes, lesions or ulcers Psychiatry: Judgement and insight appear normal. Mood & affect appropriate.     Data Reviewed: I have personally reviewed following labs and imaging studies  CBC: Recent Labs  Lab 04/13/18 0957 04/16/18 1533 04/17/18 0600 04/18/18 0312 04/18/18 1750 04/19/18 0411  WBC 18.3* 17.3* 35.7* 31.1* 20.5* 19.9*  NEUTROABS 12.6*  --  32.6* 26.5*  --  16.0*  HGB 7.7* 6.7* 7.9* 8.3* 8.4* 8.7*  HCT 23.7* 21.1* 24.4* 25.6* 26.7* 27.5*  MCV 89.8 91.3 92.4 89.8 91.8 90.5  PLT 571* 650* 469* 541* 451* 932*   Basic Metabolic Panel: Recent Labs  Lab 04/13/18 0957 04/16/18 1533 04/17/18 0600 04/18/18 0312 04/19/18 0411  NA 140 135 136 136 140  K 3.5 3.5 4.2 3.3* 4.0  CL 98 96* 102 103 109  CO2 28 26 22  21* 23  GLUCOSE 140* 151* 213* 168* 132*  BUN 9 24* 24* 24* 15   CREATININE 1.50* 1.68* 1.92* 1.38* 1.11  CALCIUM 9.5 8.9 7.9* 8.1* 8.7*  MG  --   --   --   --  1.8   GFR: Estimated Creatinine Clearance: 88.9 mL/min (by C-G formula based on SCr of 1.11 mg/dL). Liver Function Tests: Recent Labs  Lab 04/13/18 0957 04/16/18 1533 04/17/18 0600 04/18/18 0312 04/19/18 0411  AST 23 127* 107* 47* 23  ALT 16 86* 78* 58* 42  ALKPHOS 87 106 112 100 92  BILITOT 0.8 1.1 1.3* 0.5 0.8  PROT 6.9 6.7 5.9* 6.0* 5.8*  ALBUMIN 3.1* 3.3* 2.6* 2.7* 2.8*    Recent Labs  Lab 04/17/18 0600  AMMONIA 9   Coagulation Profile: Recent Labs  Lab 04/17/18 0600  INR 1.35   CBG: Recent Labs  Lab 04/16/18 1629  GLUCAP 135*   Sepsis Labs: Recent Labs  Lab 04/16/18 1656 04/17/18 0600 04/18/18 0312  PROCALCITON 2.00 19.25 14.58  LATICACIDVEN  --  1.0  --     Recent Results (from the past 240 hour(s))  Culture, blood (routine x 2)     Status: Abnormal   Collection Time: 04/16/18  5:04 PM  Result Value Ref Range Status   Specimen Description   Final    BLOOD RIGHT ARM Performed at [facility], [place] [address] [place],  [address]  Special Requests   Final    BOTTLES DRAWN AEROBIC AND ANAEROBIC Blood Culture adequate volume Performed at [facility] [address] [place], [place] [address]    Culture  Setup Time   Final    GRAM NEGATIVE RODS IN BOTH AEROBIC AND ANAEROBIC BOTTLES CRITICAL RESULT CALLED TO, READ BACK BY AND VERIFIED WITH: Chales Abrahams PharmD 11:40 04/17/18 ([email or web address]) Performed at [facility], [place] [address], [place], [place] [address]    Culture ESCHERICHIA COLI (A)  Final   Report Status 04/19/2018 FINAL  Final   Organism ID, Bacteria ESCHERICHIA COLI  Final      Susceptibility   Escherichia coli - MIC*    AMPICILLIN <=2 SENSITIVE Sensitive     CEFAZOLIN <=4 SENSITIVE Sensitive     CEFEPIME <=1 SENSITIVE Sensitive     CEFTAZIDIME <=1 SENSITIVE Sensitive     CEFTRIAXONE <=1  SENSITIVE Sensitive     CIPROFLOXACIN <=0.25 SENSITIVE Sensitive     GENTAMICIN <=1 SENSITIVE Sensitive     IMIPENEM <=0.25 SENSITIVE Sensitive     TRIMETH/SULFA <=20 SENSITIVE Sensitive     AMPICILLIN/SULBACTAM <=2 SENSITIVE Sensitive     PIP/TAZO <=4 SENSITIVE Sensitive     Extended ESBL NEGATIVE Sensitive     * ESCHERICHIA COLI  Blood Culture ID Panel (Reflexed)     Status: Abnormal   Collection Time: 04/16/18  5:04 PM  Result Value Ref Range Status   Enterococcus species NOT DETECTED NOT DETECTED Final   Listeria monocytogenes NOT DETECTED NOT DETECTED Final   Staphylococcus species NOT DETECTED NOT DETECTED Final   Staphylococcus aureus (BCID) NOT DETECTED NOT DETECTED Final   Streptococcus species NOT DETECTED NOT DETECTED Final   Streptococcus agalactiae NOT DETECTED NOT DETECTED Final   Streptococcus pneumoniae NOT DETECTED NOT DETECTED Final   Streptococcus pyogenes NOT DETECTED NOT DETECTED Final   Acinetobacter baumannii NOT DETECTED NOT DETECTED Final   Enterobacteriaceae species DETECTED (A) NOT DETECTED Final    Comment: Enterobacteriaceae represent a large family of gram-negative bacteria, not a single organism. CRITICAL RESULT CALLED TO, READ BACK BY AND VERIFIED WITH: Chales Abrahams PharmD 11:40 04/17/18 ([email or web address])    Enterobacter cloacae complex NOT DETECTED NOT DETECTED Final   Escherichia coli DETECTED (A) NOT DETECTED Final    Comment: CRITICAL RESULT CALLED TO, READ BACK BY AND VERIFIED WITH: Chales Abrahams PharmD 11:40 04/17/18 ([email or web address])    Klebsiella oxytoca NOT DETECTED NOT DETECTED Final   Klebsiella pneumoniae NOT DETECTED NOT DETECTED Final   Proteus species NOT DETECTED NOT DETECTED Final   Serratia marcescens NOT DETECTED NOT DETECTED Final   Carbapenem resistance NOT DETECTED NOT DETECTED Final   Haemophilus influenzae NOT DETECTED NOT DETECTED Final   Neisseria meningitidis NOT DETECTED NOT DETECTED Final   Pseudomonas aeruginosa NOT DETECTED NOT DETECTED Final    Candida albicans NOT DETECTED NOT DETECTED Final   Candida glabrata NOT DETECTED NOT DETECTED Final   Candida krusei NOT DETECTED NOT DETECTED Final   Candida parapsilosis NOT DETECTED NOT DETECTED Final   Candida tropicalis NOT DETECTED NOT DETECTED Final    Comment: Performed at [facility], [place] [address] [place], [place] [address]  Culture, blood (routine x 2)     Status: Abnormal   Collection Time: 04/16/18  5:09 PM  Result Value Ref Range Status   Specimen Description   Final    BLOOD RIGHT ARM Performed at [facility], [address], [place], [place] [address]  Special Requests   Final    BOTTLES DRAWN AEROBIC AND ANAEROBIC Blood Culture adequate volume Performed at [facility] [address] [place], [place] [address]    Culture  Setup Time   Final    GRAM NEGATIVE RODS IN BOTH AEROBIC AND ANAEROBIC BOTTLES IDENTIFICATION TO FOLLOW CRITICAL VALUE NOTED.  VALUE IS CONSISTENT WITH PREVIOUSLY REPORTED AND CALLED VALUE.    Culture (A)  Final    ESCHERICHIA COLI SUSCEPTIBILITIES PERFORMED ON PREVIOUS CULTURE WITHIN THE LAST 5 DAYS. Performed at [facility], [place] [address] [place], [place] [address]    Report Status 04/19/2018 FINAL  Final  Urine culture     Status: None   Collection Time: 04/17/18 12:55 AM  Result Value Ref Range Status   Specimen Description   Final    URINE, RANDOM Performed at [facility], [place] [address], [place], [place] [address]    Special Requests   Final    NONE Performed at [facility], [place] [address], [place], [place] [address]    Culture   Final    NO GROWTH Performed at [facility], [place] [address], [place], [place] [address]    Report Status 04/18/2018 FINAL  Final  MRSA PCR Screening     Status: None   Collection Time: 04/17/18  2:04 AM  Result Value Ref Range Status   MRSA by PCR NEGATIVE NEGATIVE Final    Comment:        The GeneXpert MRSA Assay  (FDA approved for NASAL specimens only), is one component of a comprehensive MRSA colonization surveillance program. It is not intended to diagnose MRSA infection nor to guide or monitor treatment for MRSA infections. Performed at [facility], [place] [address], [place], [place] [address]   Culture, blood (routine x 2)     Status: None (Preliminary result)   Collection Time: 04/18/18  4:04 PM  Result Value Ref Range Status   Specimen Description   Final    BLOOD LEFT ARM Performed at [facility] [address], [place], [place] [address]    Special Requests   Final    BOTTLES DRAWN AEROBIC AND ANAEROBIC Blood Culture adequate volume Performed at [facility] [address] [place], [place] [address]    Culture   Final    NO GROWTH < 24 HOURS Performed at [facility] [address] [place], [place] [address]    Report Status PENDING  Incomplete  Culture, blood (routine x 2)     Status: None (Preliminary result)   Collection Time: 04/18/18  4:10 PM  Result Value Ref Range Status   Specimen Description   Final    BLOOD RIGHT ARM Performed at [facility] [address] [place], [place] [address]    Special Requests   Final    BOTTLES DRAWN AEROBIC AND ANAEROBIC Blood Culture adequate volume Performed at [facility] [address], [place], [place] [address]    Culture   Final    NO GROWTH < 24 HOURS Performed at [facility] [address] [place],  [address]    Report Status PENDING  Incomplete         Radiology Studies: Ct Head Wo Contrast  Result Date: 04/17/2018 CLINICAL DATA:  Altered mental status, fever. History of lymphoma, hypertension and hyperlipidemia. EXAM: CT HEAD WITHOUT CONTRAST TECHNIQUE: Contiguous axial images were  obtained from the base of the skull through the vertex without intravenous contrast. COMPARISON:  None.  FINDINGS: BRAIN: No intraparenchymal hemorrhage, mass effect nor midline shift. Subtle bilateral mesial caudothalamic hypodensities. The ventricles and sulci are normal for age. Patchy supratentorial white matter hypodensities. No acute large vascular territory infarcts. No abnormal extra-axial fluid collections. Basal cisterns are patent. VASCULAR: Unremarkable. SKULL: No skull fracture. No significant scalp soft tissue swelling. SINUSES/ORBITS: Trace paranasal sinus mucosal thickening. Mastoid air cells are well aerated.The included ocular globes and orbital contents are non-suspicious. Status post bilateral ocular lens implants. OTHER: None. IMPRESSION: 1. Subtle mesial caudothalamic hypodensities may be artifact though, the could reflect encephalitis or Wernicke's encephalopathy. Consider MRI of the head with and without contrast. 2. Mild chronic small vessel ischemic changes. Electronically Signed   By: Elon Alas M.D.   On: 04/17/2018 17:29        Scheduled Meds: . sodium chloride flush  10-40 mL Intracatheter Q12H   Continuous Infusions: . sodium chloride Stopped (04/17/18 1026)  . cefTRIAXone (ROCEPHIN)  IV Stopped (04/18/18 1641)     LOS: 3 days     Vernell Leep, MD, FACP, [facility]. [facility] Pager [phone]  If 7PM-7AM, please contact night-coverage [email or web address] Password [email or web address] 04/19/2018, 4:00 PM

## 2018-04-20 ENCOUNTER — Inpatient Hospital Stay (HOSPITAL_COMMUNITY): Payer: 59

## 2018-04-20 DIAGNOSIS — R7881 Bacteremia: Secondary | ICD-10-CM

## 2018-04-20 DIAGNOSIS — B9620 Unspecified Escherichia coli [E. coli] as the cause of diseases classified elsewhere: Secondary | ICD-10-CM

## 2018-04-20 LAB — CBC WITH DIFFERENTIAL/PLATELET
Abs Immature Granulocytes: 0.49 10*3/uL — ABNORMAL HIGH (ref 0.00–0.07)
Basophils Absolute: 0.1 10*3/uL (ref 0.0–0.1)
Basophils Relative: 0 %
Eosinophils Absolute: 0.2 10*3/uL (ref 0.0–0.5)
Eosinophils Relative: 1 %
HCT: 25.8 % — ABNORMAL LOW (ref 39.0–52.0)
Hemoglobin: 8.1 g/dL — ABNORMAL LOW (ref 13.0–17.0)
Immature Granulocytes: 3 %
Lymphocytes Relative: 6 %
Lymphs Abs: 1.1 10*3/uL (ref 0.7–4.0)
MCH: 28.9 pg (ref 26.0–34.0)
MCHC: 31.4 g/dL (ref 30.0–36.0)
MCV: 92.1 fL (ref 80.0–100.0)
Monocytes Absolute: 2.2 10*3/uL — ABNORMAL HIGH (ref 0.1–1.0)
Monocytes Relative: 12 %
Neutro Abs: 14.2 10*3/uL — ABNORMAL HIGH (ref 1.7–7.7)
Neutrophils Relative %: 78 %
Platelets: 438 10*3/uL — ABNORMAL HIGH (ref 150–400)
RBC: 2.8 MIL/uL — ABNORMAL LOW (ref 4.22–5.81)
RDW: 16.4 % — ABNORMAL HIGH (ref 11.5–15.5)
WBC: 18.2 10*3/uL — ABNORMAL HIGH (ref 4.0–10.5)
nRBC: 0 % (ref 0.0–0.2)

## 2018-04-20 IMAGING — US US RENAL
1 series · 14 of 25 positions shown · non-contrast
Comparison: Sub-[DATE] abdominal CT.

CLINICAL DATA: Acute kidney injury

EXAM:
RENAL / URINARY TRACT ULTRASOUND COMPLETE

[Series 1: us renal · 45 acquisitions, 14 frames shown]
[im 1/45]
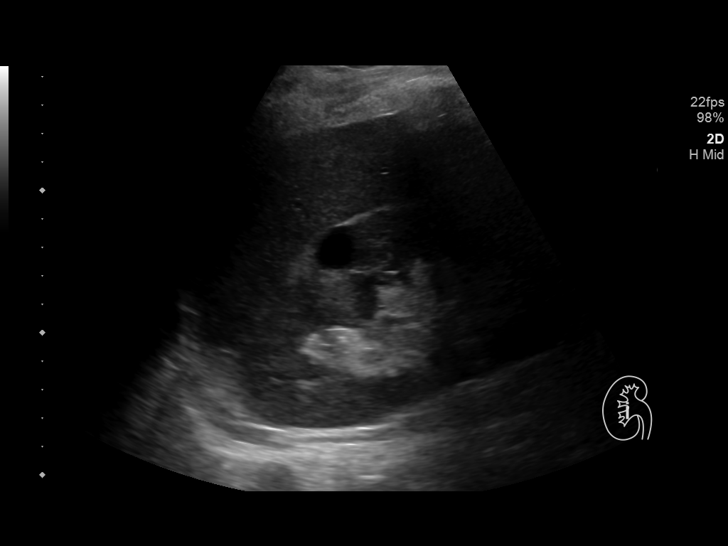
[im 4/45]
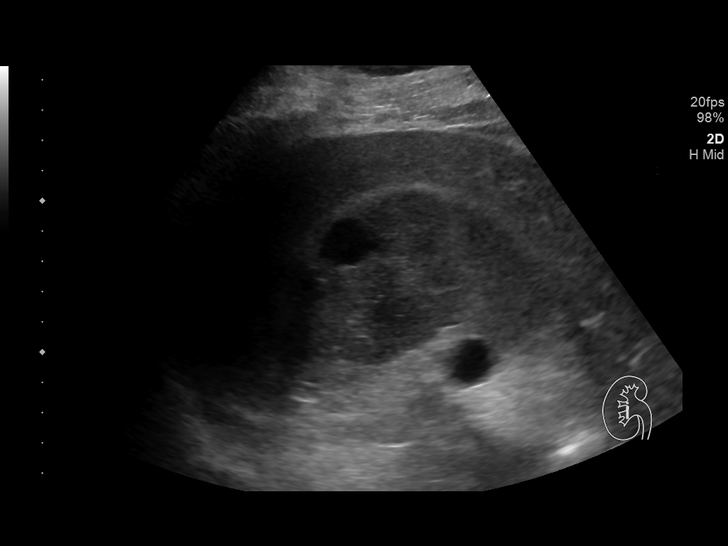
[im 8/45]
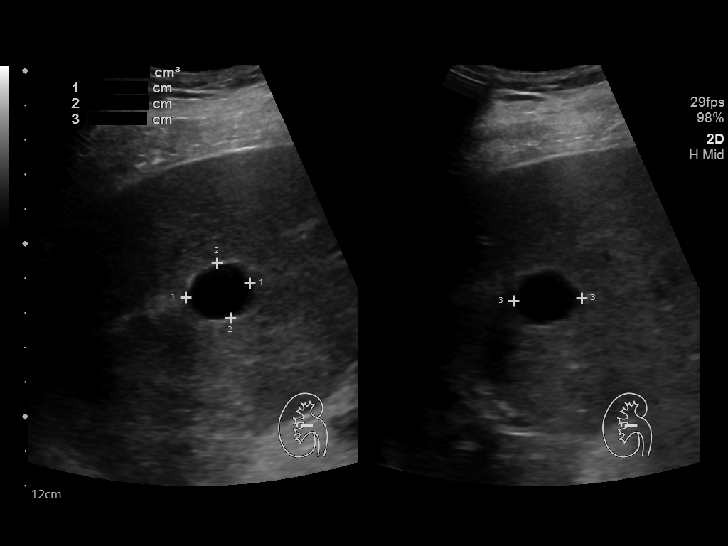
[im 12/45]
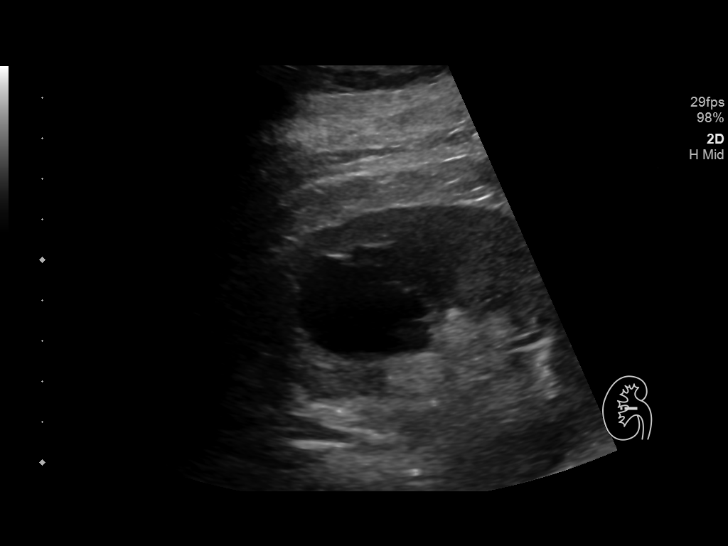
[im 15/45]
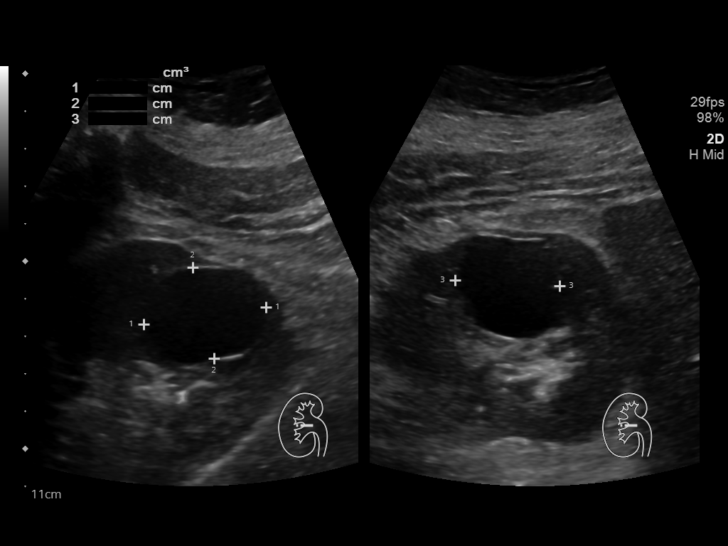
[im 17/45]
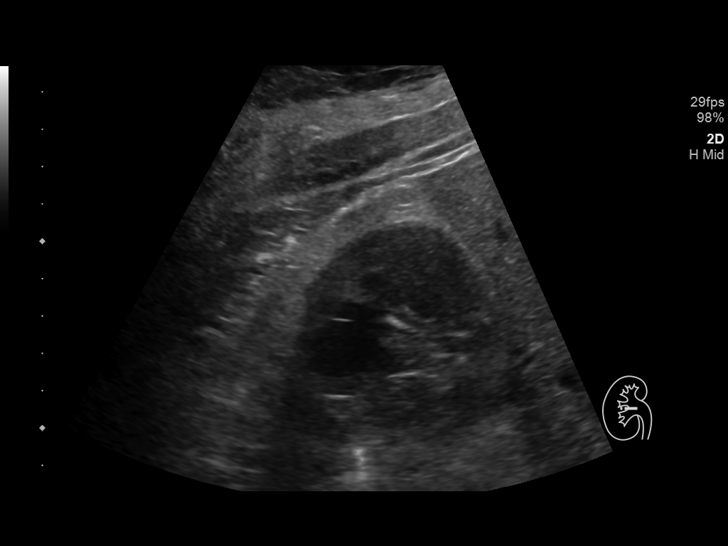
[im 21/45]
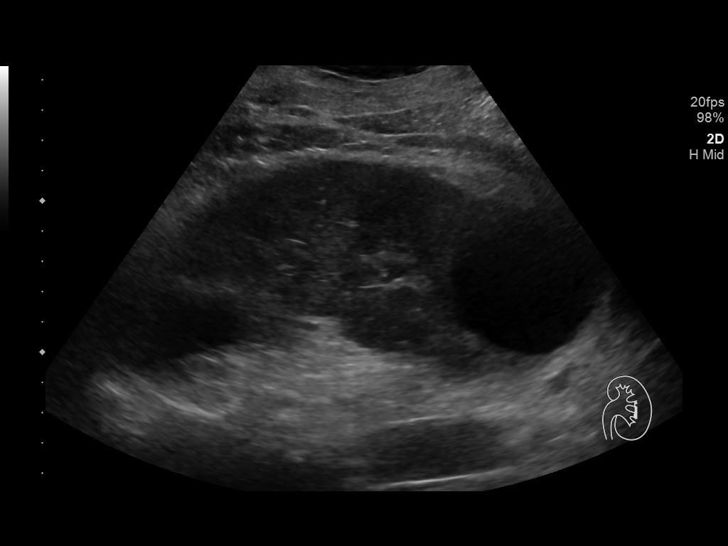
[im 24/45]
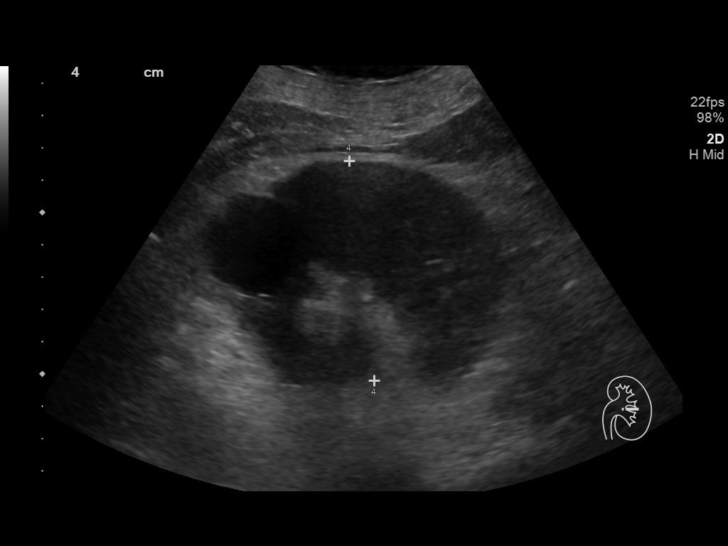
[im 28/45]
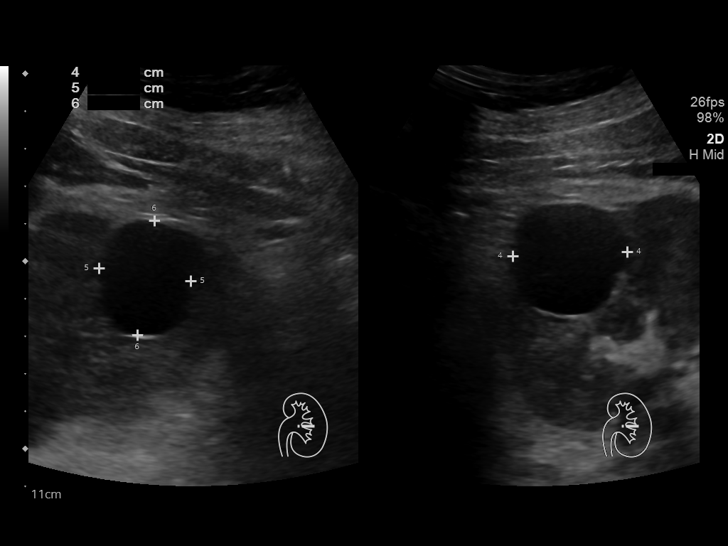
[im 30/45]
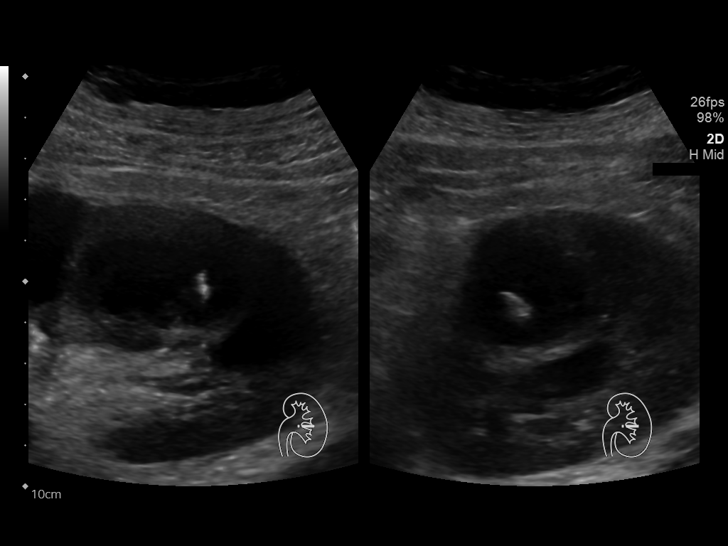
[im 34/45]
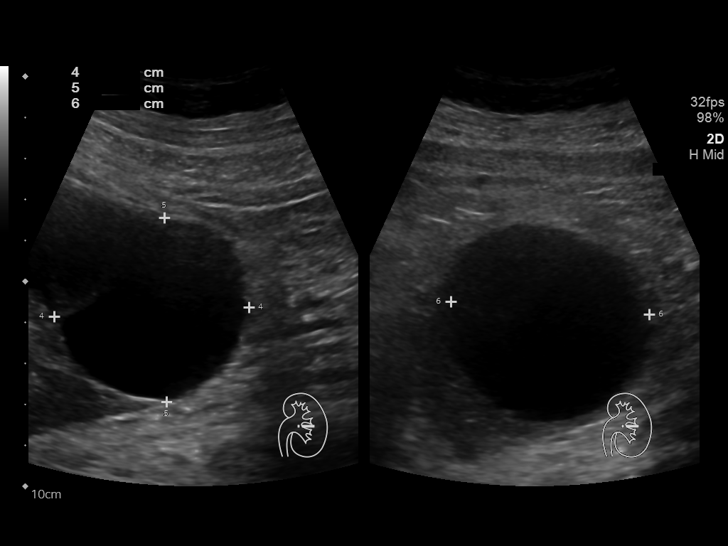
[im 37/45]
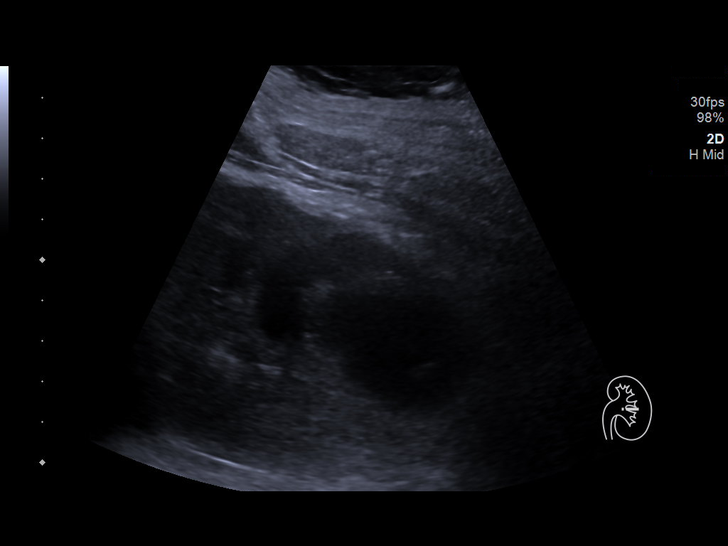
[im 41/45]
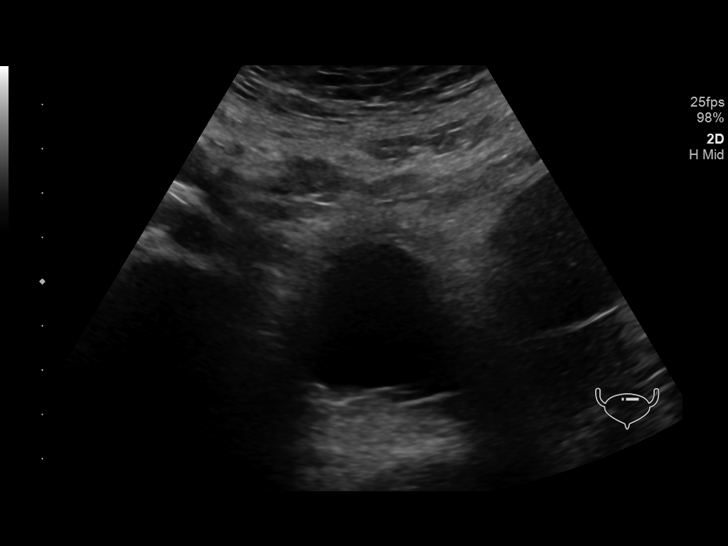
[im 45/45]
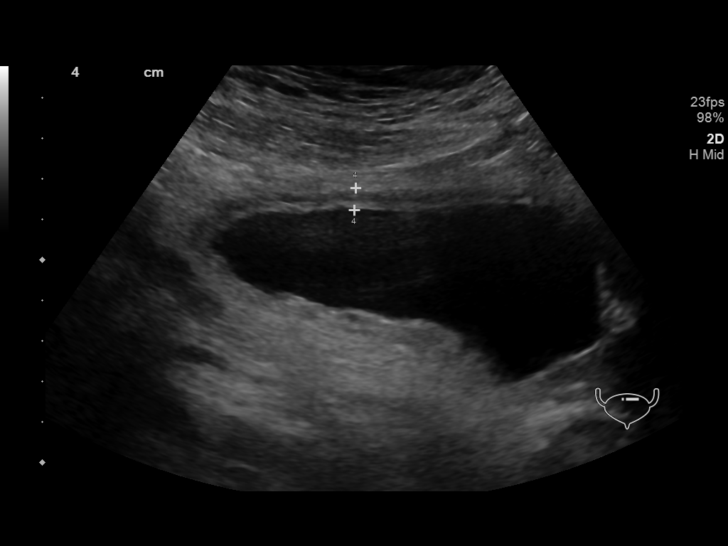

[14 of 25 positions shown; findings below may reference images not displayed]

FINDINGS: Right Kidney:

Renal measurements: 13 x 7 x 8 cm = volume: 420 mL. Scattered simple
cysts measuring up to 3.3 cm at the lower pole. No hydronephrosis.
Normal cortical echogenicity.

Left Kidney:

Renal measurements: 13 x 7 x 7 cm = volume: 340 mL. Scattered
cortical cysts measuring up to 4.9 cm lower pole. Also at the lower
pole is a cyst that is complicated by internal echogenic material
that is nonshadowing. Despite this internal complexity this cyst had
a benign appearance by CT. Given patient's intra-abdominal
malignancy there will be continued surveillance of the kidneys by
CT.

Bladder:

Bladder wall thickening that is generalized and nonspecific. No
internal debris or discrete mass.
IMPRESSION: 1. No hydronephrosis.
2. Mild nonspecific bladder wall thickening.
3. Renal cysts with complicated but benign appearing left lower pole
cyst measuring 5.1 cm.

## 2018-04-20 MED ORDER — HEPARIN SOD (PORK) LOCK FLUSH 100 UNIT/ML IV SOLN
500.0000 [IU] | INTRAVENOUS | Status: AC | PRN
  Administered 2018-04-20: 500 [IU]

## 2018-04-20 MED ORDER — SULFAMETHOXAZOLE-TRIMETHOPRIM 800-160 MG PO TABS: 1.0000 | ORAL_TABLET | Freq: Two times a day (BID) | ORAL | 0 refills | Status: AC

## 2018-04-20 MED ORDER — SULFAMETHOXAZOLE-TRIMETHOPRIM 800-160 MG PO TABS
1.0000 | ORAL_TABLET | Freq: Two times a day (BID) | ORAL | Status: DC
  Filled 2018-04-20: qty 1

## 2018-04-20 NOTE — Discharge Summary (Signed)
Physician Discharge Summary  Jesus [name] MRA:[id number] DOB: 1956/01/09  PCP: Shirline Frees, MD  Admit date: 04/16/2018 Discharge date: 04/20/2018  Recommendations for Outpatient Follow-up:  1. Dr. Sullivan Lone, Oncology: Patient advised to keep prior follow-up appointment on 04/23/2018.  To be seen with repeat labs (CBC with differential and BMP).  Please follow final blood culture results that were sent from the hospital on 04/18/2018. 2. Dr. Shirline Frees, PCP in 1 week. 3. [facility].  PCP or Oncology to assist patient with outpatient Urology consultation. 4. Recommend repeating urine microscopy & culture after completion of oral antibiotic treatment, in the next 10 to 14 days.  Home Health: None Equipment/Devices: None  Discharge Condition: Improved and stable CODE STATUS: Full Diet recommendation: Heart healthy diet.  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 He was supposed to have elective chemotherapy but was found to have AMS and fever and subsequently started on broad-spectrum intravenous antibiotics.  He had to be transferred to stepdown unit because of persistent hypotension and altered mental status.  Hemoglobin was found to be 6.7, he was transfused 2 units of packed red cells.  Clinically improved and transferred to telemetry.   Assessment & Plan:   Sepsis from E. coli bacteremia/UTI -Sepsis resolved.  Hemodynamically stable.  IV fluids discontinued.  Antibiotic management as below.  E.  coli bacteremia  - Potential sources: Most likely from urinary tract infection versus?  Acute prostatitis. - Initially started on vancomycin and Zosyn which were narrowed to Rocephin on 04/17/2018.  Both blood and urine culture E. coli are pansensitive. - Probably from UTI versus prostatitis but since the patient has a Port-A-Cath, repeated blood cultures x2 on 12/11: Negative to date and need to follow final culture results as outpatient. -ID was consulted.  Discussed with ID who recommended completing 4 days of IV ceftriaxone while hospitalized followed by oral Bactrim at discharge for additional 10 days but may need to extend course based on repeat urine culture in 10-14 days.  BMP needs to be checked next week to check on potassium while on Bactrim.  No need to pull port at this time. -Recommend urology consultation as outpatient.  Leukocytosis -Continues to improve.  Follow CBCs as outpatient.  Acute kidney injury -Likely due to combination of dehydration, ACEI, sepsis and acute urinary retention.  -Resolved after IV fluid hydration and holding lisinopril. -Renal ultrasound without hydronephrosis.  Periodically follow BMP as outpatient.  Acute metabolic encephalopathy -Most likely secondary to sepsis.  CT of the head showed some hypodensities which might be artifact but could not rule out encephalitis versus Warnicke's encephalopathy.  Mental status changes resolved so holding off on further imaging studies.  Does not look like patient has encephalitis. -Resolved.  Essential hypertension -Lisinopril was temporarily held in the hospital due to acute kidney injury.  Resumed at discharge.  Periodically follow BMP as outpatient.  Chronic anemia and thrombocytosis -Most likely secondary to lymphoma -Hemoglobin was 6.7 on presentation.  Status post transfusion of 2 units of packed red cells.  Hemoglobin stable  in the mid 8 g range. Oncology consultation 12/11 appreciated.  Follow CBC as  outpatient.  Abnormal LFTs--probably secondary to sepsis.  Resolved.  Hypokalemia -Replaced  Generalized deconditioning -No PT recommended after evaluation   Stage IV T-cell/histocyte rich large B-cell lymphoma -Oncology following.  Current chemotherapy on hold.  Has outpatient follow-up with oncology early next week.  Acute urinary retention -Required Foley catheter.  Discontinued Foley catheter on 12/12 and voiding without difficulty.  No significant residuals on bladder scan. -Patient reports similar urinary difficulties in the past which resolved after course of antibiotics and hope that is the case this time 2.  However he does have symptoms of urgency/frequency and occasional incontinence.  Recommend outpatient urology consultation.  Renal cysts Noted on renal ultrasound.  Outpatient follow-up with urology.   Consultants:  Oncology ID  Procedures:  Foley catheter-discontinued 12/12   Discharge Instructions  Discharge Instructions    Call MD for:  difficulty breathing, headache or visual disturbances   Complete by:  As directed    Call MD for:  extreme fatigue   Complete by:  As directed    Call MD for:  persistant dizziness or light-headedness   Complete by:  As directed    Call MD for:  persistant nausea and vomiting   Complete by:  As directed    Call MD for:  severe uncontrolled pain   Complete by:  As directed    Call MD for:  temperature >100.4   Complete by:  As directed    Diet - low sodium heart healthy   Complete by:  As directed    Increase activity slowly   Complete by:  As directed        Medication List    STOP taking these medications   ondansetron 8 MG tablet Commonly known as:  ZOFRAN     TAKE these medications   acetaminophen 500 MG tablet Commonly known as:  TYLENOL Take 500 mg by mouth every 6 (six) hours as needed for mild pain.   aspirin 81 MG tablet Take 81 mg by mouth daily.   CIALIS 20 MG tablet Generic drug:   tadalafil Take 20 mg by mouth daily as needed for erectile dysfunction.   dexamethasone 4 MG tablet Commonly known as:  DECADRON Take 2 tablets (8 mg total) by mouth 2 (two) times daily with a meal. Take two times a day starting the day after chemotherapy for 3 days.   hydrocortisone 2.5 % rectal cream Commonly known as:  ANUSOL-HC Place 1 application rectally 2 (two) times daily as needed for hemorrhoids.   lisinopril 20 MG tablet Commonly known as:  PRINIVIL,ZESTRIL Take 10 mg by mouth daily.   LORazepam 0.5 MG tablet Commonly known as:  ATIVAN Take 1 tablet (0.5 mg total) by mouth every 6 (six) hours as needed (Nausea or vomiting).   multivitamin with minerals tablet Take 1 tablet by mouth daily.   prochlorperazine 10 MG tablet Commonly known as:  COMPAZINE Take 1 tablet (10 mg total) by mouth every 6 (six) hours as needed (Nausea or vomiting).   sulfamethoxazole-trimethoprim 800-160 MG tablet Commonly known as:  BACTRIM DS,SEPTRA DS Take 1 tablet by mouth 2 (two) times daily for 10 days. Start taking on:  April 21, 2018   tamsulosin 0.4 MG Caps capsule Commonly known as:  FLOMAX Take 1 capsule (0.4 mg total) by mouth daily after supper.      Follow-up Information    Brunetta Genera, MD Follow up  on 04/23/2018.   Specialties:  Hematology, Oncology Why:  Keep the prior appointment you have, to be seen with repeat labs.  Recommend outpatient Urology consultation. Contact information: [place] [address] [phone]        Shirline Frees, MD. Schedule an appointment as soon as possible for a visit in 1 week(s).   Specialty:  Family Medicine Contact information: [place] [address] [phone]        [facility]. Schedule an appointment as soon as possible for a visit.   Contact information: [place] [phone]         Allergies   Allergen Reactions  . Bee Venom Anaphylaxis      Procedures/Studies: Ct Head Wo Contrast  Result Date: 04/17/2018 CLINICAL DATA:  Altered mental status, fever. History of lymphoma, hypertension and hyperlipidemia. EXAM: CT HEAD WITHOUT CONTRAST TECHNIQUE: Contiguous axial images were obtained from the base of the skull through the vertex without intravenous contrast. COMPARISON:  None. FINDINGS: BRAIN: No intraparenchymal hemorrhage, mass effect nor midline shift. Subtle bilateral mesial caudothalamic hypodensities. The ventricles and sulci are normal for age. Patchy supratentorial white matter hypodensities. No acute large vascular territory infarcts. No abnormal extra-axial fluid collections. Basal cisterns are patent. VASCULAR: Unremarkable. SKULL: No skull fracture. No significant scalp soft tissue swelling. SINUSES/ORBITS: Trace paranasal sinus mucosal thickening. Mastoid air cells are well aerated.The included ocular globes and orbital contents are non-suspicious. Status post bilateral ocular lens implants. OTHER: None. IMPRESSION: 1. Subtle mesial caudothalamic hypodensities may be artifact though, the could reflect encephalitis or Wernicke's encephalopathy. Consider MRI of the head with and without contrast. 2. Mild chronic small vessel ischemic changes. Electronically Signed   By: Elon Alas M.D.   On: 04/17/2018 17:29   US Renal  Result Date: 04/20/2018 CLINICAL DATA:  Acute kidney injury EXAM: RENAL / URINARY TRACT ULTRASOUND COMPLETE COMPARISON:  Sub-12/22/2017 abdominal CT. FINDINGS: Right Kidney: Renal measurements: 13 x 7 x 8 cm = volume: 420 mL. Scattered simple cysts measuring up to 3.3 cm at the lower pole. No hydronephrosis. Normal cortical echogenicity. Left Kidney: Renal measurements: 13 x 7 x 7 cm = volume: 340 mL. Scattered cortical cysts measuring up to 4.9 cm lower pole. Also at the lower pole is a cyst that is complicated by internal echogenic material that is  nonshadowing. Despite this internal complexity this cyst had a benign appearance by CT. Given patient's intra-abdominal malignancy there will be continued surveillance of the kidneys by CT. Bladder: Bladder wall thickening that is generalized and nonspecific. No internal debris or discrete mass. IMPRESSION: 1. No hydronephrosis. 2. Mild nonspecific bladder wall thickening. 3. Renal cysts with complicated but benign appearing left lower pole cyst measuring 5.1 cm. Electronically Signed   By: Monte Fantasia M.D.   On: 04/20/2018 09:24   Dg Chest Port 1 View  Result Date: 04/16/2018 CLINICAL DATA:  Shortness of breath, hypertension EXAM: PORTABLE CHEST 1 VIEW COMPARISON:  Portable exam 1620 hours compared to 04/04/2006 FINDINGS: RIGHT jugular Port-A-Cath with tip projecting over SVC near cavoatrial junction. Upper normal size of cardiac silhouette with slight vascular congestion. Mediastinal contours normal. Lungs clear. No infiltrate, pleural effusion or pneumothorax. IMPRESSION: No definite acute abnormalities. Electronically Signed   By: Lavonia Dana M.D.   On: 04/16/2018 17:01    Subjective: Apart from some residual urinary frequency/urgency and incontinence at times, denies any complaints and is anxious to  go home.  Ambulating comfortably without difficulty.  No fever, chills or pain reported.  Discharge Exam:  Vitals:   04/19/18 1400 04/19/18 2159 04/20/18 0426 04/20/18 1425  BP: 123/70 132/81 127/73 (!) 146/89  Pulse: 89 87 83 89  Resp: 18 18 18 16   Temp: 98.7 F (37.1 C) 99.2 F (37.3 C) 98.8 F (37.1 C) 99.4 F (37.4 C)  TempSrc: Oral Oral Oral Oral  SpO2: 97% 96% 97% 97%  Weight:      Height:        General exam: Pleasant young male, moderately built and obese lying comfortably supine in bed. Respiratory system: Clear to auscultation.  No increased work of breathing. Cardiovascular system: S1 and S2 heard, RRR.  No JVD, murmurs or pedal edema.   Gastrointestinal system: Abdomen  is obese, nondistended, soft and nontender. Normal bowel sounds heard.   Extremities: Symmetric 5 x 5 power. Central nervous system: Alert and oriented. No focal neurological deficits.   Skin: No rashes, lesions or ulcers Psychiatry: Judgement and insight appear normal. Mood & affect appropriate.      The results of significant diagnostics from this hospitalization (including imaging, microbiology, ancillary and laboratory) are listed below for reference.     Microbiology: Recent Results (from the past 240 hour(s))  Culture, blood (routine x 2)     Status: Abnormal   Collection Time: 04/16/18  5:04 PM  Result Value Ref Range Status   Specimen Description   Final    BLOOD RIGHT ARM Performed at [facility], [place] [address] [place], [place] [address]    Special Requests   Final    BOTTLES DRAWN AEROBIC AND ANAEROBIC Blood Culture adequate volume Performed at [place] [address], [place], [place] [address]    Culture  Setup Time   Final    GRAM NEGATIVE RODS IN BOTH AEROBIC AND ANAEROBIC BOTTLES CRITICAL RESULT CALLED TO, READ BACK BY AND VERIFIED WITH: Chales Abrahams PharmD 11:40 04/17/18 ([email or web address]) Performed at [facility], [place] [address] [place], [place] [address]    Culture ESCHERICHIA COLI (A)  Final   Report Status 04/19/2018 FINAL  Final   Organism ID, Bacteria ESCHERICHIA COLI  Final      Susceptibility   Escherichia coli - MIC*    AMPICILLIN <=2 SENSITIVE Sensitive     CEFAZOLIN <=4 SENSITIVE Sensitive     CEFEPIME <=1 SENSITIVE Sensitive     CEFTAZIDIME <=1 SENSITIVE Sensitive     CEFTRIAXONE <=1 SENSITIVE Sensitive     CIPROFLOXACIN <=0.25 SENSITIVE Sensitive     GENTAMICIN <=1 SENSITIVE Sensitive     IMIPENEM <=0.25 SENSITIVE Sensitive     TRIMETH/SULFA <=20 SENSITIVE Sensitive     AMPICILLIN/SULBACTAM <=2 SENSITIVE Sensitive     PIP/TAZO <=4 SENSITIVE Sensitive     Extended ESBL NEGATIVE Sensitive     *  ESCHERICHIA COLI  Blood Culture ID Panel (Reflexed)     Status: Abnormal   Collection Time: 04/16/18  5:04 PM  Result Value Ref Range Status   Enterococcus species NOT DETECTED NOT DETECTED Final   Listeria monocytogenes NOT DETECTED NOT DETECTED Final   Staphylococcus species NOT DETECTED NOT DETECTED Final   Staphylococcus aureus (BCID) NOT DETECTED NOT DETECTED Final   Streptococcus species NOT DETECTED NOT DETECTED Final   Streptococcus agalactiae NOT DETECTED NOT DETECTED Final   Streptococcus pneumoniae NOT DETECTED NOT DETECTED Final   Streptococcus pyogenes NOT DETECTED NOT DETECTED Final   Acinetobacter baumannii  NOT DETECTED NOT DETECTED Final   Enterobacteriaceae species DETECTED (A) NOT DETECTED Final    Comment: Enterobacteriaceae represent a large family of gram-negative bacteria, not a single organism. CRITICAL RESULT CALLED TO, READ BACK BY AND VERIFIED WITH: Chales Abrahams PharmD 11:40 04/17/18 ([email or web address])    Enterobacter cloacae complex NOT DETECTED NOT DETECTED Final   Escherichia coli DETECTED (A) NOT DETECTED Final    Comment: CRITICAL RESULT CALLED TO, READ BACK BY AND VERIFIED WITH: Chales Abrahams PharmD 11:40 04/17/18 ([email or web address])    Klebsiella oxytoca NOT DETECTED NOT DETECTED Final   Klebsiella pneumoniae NOT DETECTED NOT DETECTED Final   Proteus species NOT DETECTED NOT DETECTED Final   Serratia marcescens NOT DETECTED NOT DETECTED Final   Carbapenem resistance NOT DETECTED NOT DETECTED Final   Haemophilus influenzae NOT DETECTED NOT DETECTED Final   Neisseria meningitidis NOT DETECTED NOT DETECTED Final   Pseudomonas aeruginosa NOT DETECTED NOT DETECTED Final   Candida albicans NOT DETECTED NOT DETECTED Final   Candida glabrata NOT DETECTED NOT DETECTED Final   Candida krusei NOT DETECTED NOT DETECTED Final   Candida parapsilosis NOT DETECTED NOT DETECTED Final   Candida tropicalis NOT DETECTED NOT DETECTED Final    Comment: Performed at [facility], [place]  [address] [place], [place] [address]  Culture, blood (routine x 2)     Status: Abnormal   Collection Time: 04/16/18  5:09 PM  Result Value Ref Range Status   Specimen Description   Final    BLOOD RIGHT ARM Performed at [facility], [address] [place], [place] [address]    Special Requests   Final    BOTTLES DRAWN AEROBIC AND ANAEROBIC Blood Culture adequate volume Performed at [place] [address], [place], [place] [address]    Culture  Setup Time   Final    GRAM NEGATIVE RODS IN BOTH AEROBIC AND ANAEROBIC BOTTLES IDENTIFICATION TO FOLLOW CRITICAL VALUE NOTED.  VALUE IS CONSISTENT WITH PREVIOUSLY REPORTED AND CALLED VALUE.    Culture (A)  Final    ESCHERICHIA COLI SUSCEPTIBILITIES PERFORMED ON PREVIOUS CULTURE WITHIN THE LAST 5 DAYS. Performed at [facility], [place] [address] [place], [place] [address]    Report Status 04/19/2018 FINAL  Final  Urine culture     Status: None   Collection Time: 04/17/18 12:55 AM  Result Value Ref Range Status   Specimen Description   Final    URINE, RANDOM Performed at [facility], [place] [address] [place], [place] [address]    Special Requests   Final    NONE Performed at [facility], [place] [address], [place], [place] [address]    Culture   Final    NO GROWTH Performed at [facility], [place] [address], [place], [place] [address]    Report Status 04/18/2018 FINAL  Final  MRSA PCR Screening     Status: None   Collection Time: 04/17/18  2:04 AM  Result Value Ref Range Status   MRSA by PCR NEGATIVE NEGATIVE Final    Comment:        The GeneXpert MRSA Assay (FDA approved for NASAL specimens only), is one component of a comprehensive MRSA colonization surveillance program. It is not intended to diagnose MRSA infection nor to guide or monitor treatment for MRSA infections. Performed at [facility], [place] [address], [place], [place] [address]    Culture, blood (routine x 2)  Status: None (Preliminary result)   Collection Time: 04/18/18  4:04 PM  Result Value Ref Range Status   Specimen Description   Final    BLOOD LEFT ARM Performed at [place] [address], [place], [place] [address]    Special Requests   Final    BOTTLES DRAWN AEROBIC AND ANAEROBIC Blood Culture adequate volume Performed at [place] [address], [place], [place] [address]    Culture   Final    NO GROWTH 2 DAYS Performed at [place] [address] [place], [place] [address]    Report Status PENDING  Incomplete  Culture, blood (routine x 2)     Status: None (Preliminary result)   Collection Time: 04/18/18  4:10 PM  Result Value Ref Range Status   Specimen Description   Final    BLOOD RIGHT ARM Performed at [place] [address] [place], [place] [address]    Special Requests   Final    BOTTLES DRAWN AEROBIC AND ANAEROBIC Blood Culture adequate volume Performed at [place] [address] [place], [place] [address]    Culture   Final    NO GROWTH 2 DAYS Performed at [place] [address] [place], [place] [address]    Report Status PENDING  Incomplete     Labs: CBC: Recent Labs  Lab 04/17/18 0600 04/18/18 0312 04/18/18 1750 04/19/18 0411 04/20/18 0409  WBC 35.7* 31.1* 20.5* 19.9* 18.2*  NEUTROABS 32.6* 26.5*  --  16.0* 14.2*  HGB 7.9* 8.3* 8.4* 8.7* 8.1*  HCT 24.4* 25.6* 26.7* 27.5* 25.8*  MCV 92.4 89.8 91.8 90.5 92.1  PLT 469* 541* 451* 525* 103*   Basic Metabolic Panel: Recent Labs  Lab 04/16/18 1533 04/17/18 0600 04/18/18 0312 04/19/18 0411  NA 135 136 136 140  K 3.5 4.2 3.3* 4.0  CL 96* 102 103 109  CO2 26 22 21* 23  GLUCOSE 151* 213* 168* 132*  BUN 24* 24* 24* 15  CREATININE 1.68* 1.92* 1.38* 1.11  CALCIUM 8.9 7.9* 8.1* 8.7*  MG  --   --   --  1.8   Liver Function Tests: Recent Labs  Lab  04/16/18 1533 04/17/18 0600 04/18/18 0312 04/19/18 0411  AST 127* 107* 47* 23  ALT 86* 78* 58* 42  ALKPHOS 106 112 100 92  BILITOT 1.1 1.3* 0.5 0.8  PROT 6.7 5.9* 6.0* 5.8*  ALBUMIN 3.3* 2.6* 2.7* 2.8*   CBG: Recent Labs  Lab 04/16/18 1629  GLUCAP 135*   Urinalysis    Component Value Date/Time   COLORURINE YELLOW 04/16/2018 1255   APPEARANCEUR CLOUDY (A) 04/16/2018 1255   LABSPEC 1.014 04/16/2018 1255   PHURINE 5.0 04/16/2018 1255   GLUCOSEU NEGATIVE 04/16/2018 1255   HGBUR MODERATE (A) 04/16/2018 1255   BILIRUBINUR NEGATIVE 04/16/2018 1255   BILIRUBINUR negative 12/22/2017 1545   KETONESUR NEGATIVE 04/16/2018 1255   PROTEINUR 100 (A) 04/16/2018 1255   UROBILINOGEN 1.0 12/22/2017 1545   NITRITE NEGATIVE 04/16/2018 1255   LEUKOCYTESUR MODERATE (A) 04/16/2018 1255    I discussed in detail with patient's daughter at bedside, updated care and answered questions.  Time coordinating discharge: 40 minutes  SIGNED:  Vernell Leep, MD, FACP, [facility]. [facility] Pager [phone]  If 7PM-7AM, please contact night-coverage [email or web address] Password [facility] 04/20/2018, 2:36 PM

## 2018-04-20 NOTE — Discharge Instructions (Signed)

## 2018-04-23 ENCOUNTER — Encounter: Payer: Self-pay | Admitting: Hematology

## 2018-04-23 ENCOUNTER — Inpatient Hospital Stay: Payer: 59

## 2018-04-23 LAB — CULTURE, BLOOD (ROUTINE X 2)
Culture: NO GROWTH
Culture: NO GROWTH
Special Requests: ADEQUATE
Special Requests: ADEQUATE

## 2018-04-23 NOTE — Progress Notes (Signed)
Patient came in to bring proof of income for one-time $700 [place]. Patient approved and was given a copy of the approval letter as well as the expense sheet along with the OP pharmacy information. Discussed in detail expenses and how they are covered. He verbalized understanding.  He has my card for any additional financial questions or concerns.

## 2018-04-24 ENCOUNTER — Encounter: Payer: Self-pay | Admitting: Hematology

## 2018-04-25 ENCOUNTER — Telehealth: Payer: Self-pay | Admitting: *Deleted

## 2018-04-25 NOTE — Telephone Encounter (Signed)
TCT patient regarding his MY Chart message (pain over left kidney after ultrasound of kidneys done on 04/20/18)  Spoke with patient. He states the pain has eased off significantly and doesn't seem to be an issue at this time.  He did ask what he could take for pain if he needed it, as he was concerned about his kidneys. Advised pt that he can take tylenol but not to take ibuprofen type medications as these could adversly affect his kidneys.  Noted pt's creatinine has been elevated in recent days but that level is coming down. Encouraged patient to drink plenty of fluids/water. Reviewed upcoming appts with him. His next appt is 05/03/18 @ 1pm for labs then Dr. Irene Limbo. No other questions or concerns

## 2018-04-26 ENCOUNTER — Telehealth: Payer: Self-pay | Admitting: *Deleted

## 2018-04-26 NOTE — Telephone Encounter (Signed)
Per Dr. Irene Limbo: arrange appt with patient for 12/26 - on review, appt scheduled for 12/26. No call at this time

## 2018-05-03 ENCOUNTER — Other Ambulatory Visit: Payer: Self-pay | Admitting: *Deleted

## 2018-05-03 ENCOUNTER — Inpatient Hospital Stay: Payer: 59

## 2018-05-03 ENCOUNTER — Telehealth: Payer: Self-pay | Admitting: Hematology

## 2018-05-03 ENCOUNTER — Telehealth: Payer: Self-pay | Admitting: *Deleted

## 2018-05-03 ENCOUNTER — Inpatient Hospital Stay (HOSPITAL_BASED_OUTPATIENT_CLINIC_OR_DEPARTMENT_OTHER): Payer: 59 | Admitting: Hematology

## 2018-05-03 VITALS — BP 156/88 | HR 93 | Temp 98.0°F | Resp 18 | Ht 72.0 in | Wt 260.6 lb

## 2018-05-03 DIAGNOSIS — Z7289 Other problems related to lifestyle: Secondary | ICD-10-CM | POA: Diagnosis not present

## 2018-05-03 DIAGNOSIS — F17210 Nicotine dependence, cigarettes, uncomplicated: Secondary | ICD-10-CM | POA: Diagnosis not present

## 2018-05-03 DIAGNOSIS — C8338 Diffuse large B-cell lymphoma, lymph nodes of multiple sites: Secondary | ICD-10-CM

## 2018-05-03 DIAGNOSIS — D649 Anemia, unspecified: Secondary | ICD-10-CM

## 2018-05-03 DIAGNOSIS — K649 Unspecified hemorrhoids: Secondary | ICD-10-CM | POA: Diagnosis not present

## 2018-05-03 DIAGNOSIS — I10 Essential (primary) hypertension: Secondary | ICD-10-CM | POA: Diagnosis not present

## 2018-05-03 DIAGNOSIS — D500 Iron deficiency anemia secondary to blood loss (chronic): Secondary | ICD-10-CM

## 2018-05-03 LAB — CMP (CANCER CENTER ONLY)
ALT: 11 U/L (ref 0–44)
AST: 21 U/L (ref 15–41)
Albumin: 3.1 g/dL — ABNORMAL LOW (ref 3.5–5.0)
Alkaline Phosphatase: 90 U/L (ref 38–126)
Anion gap: 8 (ref 5–15)
BUN: 12 mg/dL (ref 8–23)
CO2: 25 mmol/L (ref 22–32)
Calcium: 9.5 mg/dL (ref 8.9–10.3)
Chloride: 109 mmol/L (ref 98–111)
Creatinine: 1.09 mg/dL (ref 0.61–1.24)
GFR, Est AFR Am: >60 mL/min (ref >60)
GFR, Estimated: >60 mL/min (ref >60)
Glucose, Bld: 165 mg/dL — ABNORMAL HIGH (ref 70–99)
Potassium: 3.9 mmol/L (ref 3.5–5.1)
Sodium: 142 mmol/L (ref 135–145)
Total Bilirubin: 0.2 mg/dL — ABNORMAL LOW (ref 0.3–1.2)
Total Protein: 6.6 g/dL (ref 6.5–8.1)

## 2018-05-03 LAB — CBC WITH DIFFERENTIAL/PLATELET
Abs Immature Granulocytes: 0.11 10*3/uL — ABNORMAL HIGH (ref 0.00–0.07)
Basophils Absolute: 0.1 10*3/uL (ref 0.0–0.1)
Basophils Relative: 1 %
Eosinophils Absolute: 0.5 10*3/uL (ref 0.0–0.5)
Eosinophils Relative: 5 %
HCT: 26.3 % — ABNORMAL LOW (ref 39.0–52.0)
Hemoglobin: 8.2 g/dL — ABNORMAL LOW (ref 13.0–17.0)
Immature Granulocytes: 1 %
Lymphocytes Relative: 22 %
Lymphs Abs: 2.2 10*3/uL (ref 0.7–4.0)
MCH: 28.4 pg (ref 26.0–34.0)
MCHC: 31.2 g/dL (ref 30.0–36.0)
MCV: 91 fL (ref 80.0–100.0)
Monocytes Absolute: 0.8 10*3/uL (ref 0.1–1.0)
Monocytes Relative: 8 %
Neutro Abs: 6.2 10*3/uL (ref 1.7–7.7)
Neutrophils Relative %: 63 %
Platelets: 333 10*3/uL (ref 150–400)
RBC: 2.89 MIL/uL — ABNORMAL LOW (ref 4.22–5.81)
RDW: 16.7 % — ABNORMAL HIGH (ref 11.5–15.5)
WBC: 9.9 10*3/uL (ref 4.0–10.5)
nRBC: 0 % (ref 0.0–0.2)

## 2018-05-03 LAB — SAMPLE TO BLOOD BANK

## 2018-05-03 LAB — MAGNESIUM: Magnesium: 1.6 mg/dL — ABNORMAL LOW (ref 1.7–2.4)

## 2018-05-03 LAB — PREPARE RBC (CROSSMATCH)

## 2018-05-03 NOTE — Telephone Encounter (Signed)
Printed calendar and avs. °

## 2018-05-03 NOTE — Progress Notes (Signed)
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 male is here because of left lower extremity edema and lymphadenopathy.  The patient was seen in the emergency room this past Friday for the same issue.  A CT of the abdomen and pelvis was performed showing bulky left inguinal, left hemipelvic, and retroperitoneal adenopathy.  He was referred to [place] from the emergency room for further evaluation.  Doppler ultrasound of the left lower extremity was performed and was negative for DVT. Patient reports that he has been having left lower extremity edema in his left groin and left leg for approximately 1 month.  He states that the swelling in the left groin started to get better but then worsened.  The left lower extremity edema has slowly worsened over time.  Patient denies having fevers and chills.  He reports that he does have night sweats at times.  He reported having headaches approximate 1 [place]  He thinks his headaches are related to not having his blood pressure medication.  His headaches have now resolved.  He denies visual changes.  The patient denies chest pain, shortness of breath and cough.  No nausea, vomiting, constipation, diarrhea.  Denies abdominal pain.  The patient denies recent weight loss and has actually gained weight recently.  Patient denies epistaxis, bleeding gums, hemoptysis, hematuria, but occasionally, and melena.  He reports increased urinary frequency over the past month but no dysuria.  The patient is here for evaluation and discussion of his recent CT and lab findings.  Interval  History:   Jesus [name] returns today for management and evaluation of his T-Cell/histocyte rich Large B-Cell Lymphoma. I last saw the pt in clinic on 04/13/18 before hospitalization for E.Coli sepsis. He was treated with antibiotics and discharged on 04/20/18. He has 1 pill left of bactrim.  He notes he is doing well overall but tired after Christmas. He denies issues passing urine. He will follow up with urologist next week. He denies bloody or black stool or urine, no bleeding elsewhere.   On review of symptoms, pt  Notes his energy level improved but not baseline. He notes after abdominal US he has left flank pain/discomfort.    MEDICAL HISTORY:  Past Medical History:  Diagnosis Date  . Allergy   . History of kidney stones   . Hyperlipidemia   . Hypertension   . Lymphadenopathy   . Pain, lower leg    Bilateral  . Peripheral arterial disease ([place])   . Pre-diabetes   . Red-green color blindness   . Snores   . Wears glasses     SURGICAL HISTORY: Past Surgical History:  Procedure Laterality Date  . CATARACT EXTRACTION W/ INTRAOCULAR LENS  IMPLANT, BILATERAL    . COLONOSCOPY    . dislodged salava stone    . FRACTURE SURGERY    . HAND ARTHROPLASTY  1995   crushed left hand  . INGUINAL LYMPH NODE BIOPSY Left 01/02/2018   Procedure: LEFT INGUINAL LYMPH NODE BIOPSY;  Surgeon: Rolm Bookbinder, MD;  Location: [facility];  Service: General;  Laterality:  Left;  . IR IMAGING GUIDED PORT INSERTION  01/15/2018  . MICROLARYNGOSCOPY Left 01/17/2014   Procedure: MICROLARYNGOSCOPY WITH EXCISION OF THE BIOPSY OF LEFT VOCAL CORD LESION;  Surgeon: Izora Gala, MD;  Location: [facility];  Service: ENT;  Laterality: Left;  . ORIF FOOT FRACTURE  2005   left    SOCIAL HISTORY: Social History   Socioeconomic History  . Marital status: Divorced    Spouse name: Not on file  . Number of children: 3  . Years of education: Not on file  . Highest education level: Not on file    Occupational History  . Not on file  Social Needs  . Financial resource strain: Not on file  . Food insecurity:    Worry: Not on file    Inability: Not on file  . Transportation needs:    Medical: Not on file    Non-medical: Not on file  Tobacco Use  . Smoking status: Current Every Day Smoker    Packs/day: 0.50    Years: 36.00    Pack years: 18.00    Types: Cigarettes  . Smokeless tobacco: Never Used  Substance and Sexual Activity  . Alcohol use: Yes    Alcohol/week: 15.0 standard drinks    Types: 10 Cans of beer, 5 Shots of liquor per week    Comment: weekends  . Drug use: Yes    Types: Cocaine    Comment: reports cocaine usage ~2X/ month; last use 12/26/17  . Sexual activity: Not on file  Lifestyle  . Physical activity:    Days per week: Not on file    Minutes per session: Not on file  . Stress: Not on file  Relationships  . Social connections:    Talks on phone: Not on file    Gets together: Not on file    Attends religious service: Not on file    Active member of club or organization: Not on file    Attends meetings of clubs or organizations: Not on file    Relationship status: Not on file  . Intimate partner violence:    Fear of current or ex partner: Not on file    Emotionally abused: Not on file    Physically abused: Not on file    Forced sexual activity: Not on file  Other Topics Concern  . Not on file  Social History Narrative  . Not on file    FAMILY HISTORY: Family History  Problem Relation Age of Onset  . Breast cancer Mother   . Diabetes Father   . Hypertension Father   . Stroke Father   . Mental illness Sister   . Hypertension Daughter   . Mental illness Daughter   . Hypertension Brother   . Colon cancer Brother   . Breast cancer Sister     ALLERGIES:  is allergic to bee venom.  MEDICATIONS:  Current Outpatient Medications  Medication Sig Dispense Refill  . acetaminophen (TYLENOL) 500 MG tablet Take 500 mg by mouth every 6 (six)  hours as needed for mild pain.     Marland Kitchen aspirin 81 MG tablet Take 81 mg by mouth daily.    Marland Kitchen CIALIS 20 MG tablet Take 20 mg by mouth daily as needed for erectile dysfunction.   0  . dexamethasone (DECADRON) 4 MG tablet Take 2 tablets (8 mg total) by mouth 2 (two) times daily with a meal. Take two times a day starting the day after chemotherapy for 3 days. [place]  tablet 1  . hydrocortisone (ANUSOL-HC) 2.5 % rectal cream Place 1 application rectally 2 (two) times daily as needed for hemorrhoids. (Patient not taking: Reported on 04/16/2018) 30 g 0  . lisinopril (PRINIVIL,ZESTRIL) 20 MG tablet Take 10 mg by mouth daily.    Marland Kitchen LORazepam (ATIVAN) 0.5 MG tablet Take 1 tablet (0.5 mg total) by mouth every 6 (six) hours as needed (Nausea or vomiting). (Patient not taking: Reported on 04/16/2018) 30 tablet 0  . Multiple Vitamins-Minerals (MULTIVITAMIN WITH MINERALS) tablet Take 1 tablet by mouth daily.    . prochlorperazine (COMPAZINE) 10 MG tablet Take 1 tablet (10 mg total) by mouth every 6 (six) hours as needed (Nausea or vomiting). (Patient not taking: Reported on 04/16/2018) 30 tablet 1  . tamsulosin (FLOMAX) 0.4 MG CAPS capsule Take 1 capsule (0.4 mg total) by mouth daily after supper. 30 capsule 2   No current facility-administered medications for this visit.     REVIEW OF SYSTEMS:    A 10+ POINT REVIEW OF SYSTEMS WAS OBTAINED including neurology, dermatology, psychiatry, cardiac, respiratory, lymph, extremities, GI, GU, Musculoskeletal, constitutional, breasts, reproductive, HEENT.  All pertinent positives are noted in the HPI.  All others are negative.   PHYSICAL EXAMINATION: ECOG PERFORMANCE STATUS: 1 - Symptomatic but completely ambulatory  Vitals:   05/03/18 1347  BP: (!) 156/88  Pulse: 93  Resp: 18  Temp: 98 F (36.7 C)  SpO2: 100%   Filed Weights   05/03/18 1347  Weight: 260 lb 9.6 oz (118.2 kg)    GENERAL:alert, in no acute distress and comfortable SKIN: no acute rashes, no significant  lesions EYES: conjunctiva are pink and non-injected, sclera anicteric OROPHARYNX: MMM, no exudates, no oropharyngeal erythema or ulceration NECK: supple, no JVD LYMPH:  no palpable lymphadenopathy in the cervical, axillary or inguinal regions LUNGS: clear to auscultation b/l with normal respiratory effort HEART: regular rate & rhythm ABDOMEN:  normoactive bowel sounds , non tender, not distended. No palpable hepatosplenomegaly.  Extremity: no pedal edema PSYCH: alert & oriented x 3 with fluent speech NEURO: no focal motor/sensory deficits   LABORATORY DATA:  I have reviewed the data as listed CBC Latest Ref Rng & Units 05/03/2018 04/20/2018 04/19/2018  WBC 4.0 - 10.5 K/uL 9.9 18.2(H) 19.9(H)  Hemoglobin 13.0 - 17.0 g/dL 8.2(L) 8.1(L) 8.7(L)  Hematocrit 39.0 - 52.0 % 26.3(L) 25.8(L) 27.5(L)  Platelets 150 - 400 K/uL 333 438(H) 525(H)    CMP Latest Ref Rng & Units 04/19/2018 04/18/2018 04/17/2018  Glucose 70 - 99 mg/dL 132(H) 168(H) 213(H)  BUN 8 - 23 mg/dL 15 24(H) 24(H)  Creatinine 0.61 - 1.24 mg/dL 1.11 1.38(H) 1.92(H)  Sodium 135 - 145 mmol/L 140 136 136  Potassium 3.5 - 5.1 mmol/L 4.0 3.3(L) 4.2  Chloride 98 - 111 mmol/L 109 103 102  CO2 22 - 32 mmol/L 23 21(L) 22  Calcium 8.9 - 10.3 mg/dL 8.7(L) 8.1(L) 7.9(L)  Total Protein 6.5 - 8.1 g/dL 5.8(L) 6.0(L) 5.9(L)  Total Bilirubin 0.3 - 1.2 mg/dL 0.8 0.5 1.3(H)  Alkaline Phos 38 - 126 U/L 92 100 112  AST 15 - 41 U/L 23 47(H) 107(H)  ALT 0 - 44 U/L 42 58(H) 78(H)   Component     Latest Ref Rng & Units 05/03/2018  Sodium     135 - 145 mmol/L 142  Potassium     3.5 - 5.1 mmol/L 3.9  Chloride     98 - 111 mmol/L 109  CO2     22 -  32 mmol/L 25  Glucose     70 - 99 mg/dL 165 (H)  BUN     8 - 23 mg/dL 12  Creatinine     0.61 - 1.24 mg/dL 1.09  Calcium     8.9 - 10.3 mg/dL 9.5  Total Protein     6.5 - 8.1 g/dL 6.6  Albumin     3.5 - 5.0 g/dL 3.1 (L)  AST     15 - 41 U/L 21  ALT     0 - 44 U/L 11  Alkaline  Phosphatase     38 - 126 U/L 90  Total Bilirubin     0.3 - 1.2 mg/dL 0.2 (L)  GFR, Est Non African American     >60 mL/min >60  GFR, Est African American     >60 mL/min >60  Anion gap     5 - 15 8  Magnesium     1.7 - 2.4 mg/dL 1.6 (L)   01/02/18 Left Inguinal LN Bx:   12/26/17 Tissue Flow Cytometry:   12/26/17 Inguinal Core biopsy:    RADIOGRAPHIC STUDIES: I have personally reviewed the radiological images as listed and agreed with the findings in the report. Ct Head Wo Contrast  Result Date: 04/17/2018 CLINICAL DATA:  Altered mental status, fever. History of lymphoma, hypertension and hyperlipidemia. EXAM: CT HEAD WITHOUT CONTRAST TECHNIQUE: Contiguous axial images were obtained from the base of the skull through the vertex without intravenous contrast. COMPARISON:  None. FINDINGS: BRAIN: No intraparenchymal hemorrhage, mass effect nor midline shift. Subtle bilateral mesial caudothalamic hypodensities. The ventricles and sulci are normal for age. Patchy supratentorial white matter hypodensities. No acute large vascular territory infarcts. No abnormal extra-axial fluid collections. Basal cisterns are patent. VASCULAR: Unremarkable. SKULL: No skull fracture. No significant scalp soft tissue swelling. SINUSES/ORBITS: Trace paranasal sinus mucosal thickening. Mastoid air cells are well aerated.The included ocular globes and orbital contents are non-suspicious. Status post bilateral ocular lens implants. OTHER: None. IMPRESSION: 1. Subtle mesial caudothalamic hypodensities may be artifact though, the could reflect encephalitis or Wernicke's encephalopathy. Consider MRI of the head with and without contrast. 2. Mild chronic small vessel ischemic changes. Electronically Signed   By: Elon Alas M.D.   On: 04/17/2018 17:29   US Renal  Result Date: 04/20/2018 CLINICAL DATA:  Acute kidney injury EXAM: RENAL / URINARY TRACT ULTRASOUND COMPLETE COMPARISON:  Sub-12/22/2017 abdominal CT.  FINDINGS: Right Kidney: Renal measurements: 13 x 7 x 8 cm = volume: 420 mL. Scattered simple cysts measuring up to 3.3 cm at the lower pole. No hydronephrosis. Normal cortical echogenicity. Left Kidney: Renal measurements: 13 x 7 x 7 cm = volume: 340 mL. Scattered cortical cysts measuring up to 4.9 cm lower pole. Also at the lower pole is a cyst that is complicated by internal echogenic material that is nonshadowing. Despite this internal complexity this cyst had a benign appearance by CT. Given patient's intra-abdominal malignancy there will be continued surveillance of the kidneys by CT. Bladder: Bladder wall thickening that is generalized and nonspecific. No internal debris or discrete mass. IMPRESSION: 1. No hydronephrosis. 2. Mild nonspecific bladder wall thickening. 3. Renal cysts with complicated but benign appearing left lower pole cyst measuring 5.1 cm. Electronically Signed   By: Monte Fantasia M.D.   On: 04/20/2018 09:24   Dg Chest Port 1 View  Result Date: 04/16/2018 CLINICAL DATA:  Shortness of breath, hypertension EXAM: PORTABLE CHEST 1 VIEW COMPARISON:  Portable exam 1620 hours compared to 04/04/2006 FINDINGS:  RIGHT jugular Port-A-Cath with tip projecting over SVC near cavoatrial junction. Upper normal size of cardiac silhouette with slight vascular congestion. Mediastinal contours normal. Lungs clear. No infiltrate, pleural effusion or pneumothorax. IMPRESSION: No definite acute abnormalities. Electronically Signed   By: Lavonia Dana M.D.   On: 04/16/2018 17:01    ASSESSMENT & PLAN:   This is a pleasant 62 y.o. 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  CT of the abdomen and pelvis performed on 12/22/2017 showed  bulky left inguinal, left hemipelvic, and retroperitoneal adenopathy.   01/02/18 Left inguinal LN Biopsy revealed T-Cell/histocyte rich Large B-Cell Lymphoma  12/27/17 ECHO revealed LV EF of 55-60%   01/05/18 PET/CT revealedMassively enlarged pelvic lymph nodes intense metabolic activity consistent lymphoma. 2. Additional hypermetabolic lymph nodes in the porta hepatis and retroperitoneum LEFT aorta. 3. Solitary hypermetabolic mediastinal lymph node in the upper LEFT Mediastinum. 4. 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 5. Normal spleen.  HIV non reactive on 12/22/2017.Hep C and hep B serology negative.  03/14/18 PET/CT revealedPET-CT findings suggest an excellent response to chemotherapy. The abdominal lymphadenopathy has near completely resolved and demonstrates a near complete metabolic response. The pelvic and inguinal adenopathy has significantly decreased in size and the metabolic activity has significantly decreased. 2. Diffuse marrow activity likely due to chemotherapy and or marrow stimulating drugs. I do not see any discrete persistent lesions.   2) left lower extremity swelling- now resolved Doppler ultrasound for DVT was negative in the left lower extremity.  Likely from venous compression +/- lymphatic obstruction from bulky left inguinal, left hemipelvic, andretroperitoneal adenopathy.   3) Intermediate to high risk for tumor lysis syndrome.- no TLS noted with allopurinol prophylaxis after C1. Off allopurinol.   4) S/p Port a cath placement   5)h/oE.coli UTI - Pansensitive -Resolved.Also appearedto have BPH like symptomatology. -Was hospitalized 04/16/18-04/20/18 for [facility] Sepsis from UTI or Prostatitis, treated with antibiotics. Currently on Bactrim with 1 pill left.    PLAN: -Discussed pt labwork today, 05/03/18: WBC normal, Hg stable at 8.2, Blood chemistries are still pending.  -PRBC transfusion 2 units  in the next 1-2 days -The pt has no prohibitive toxicities s/p 4 cycles of EPOCH-R  -I encouraged him to work on adequate eating and water consumption. -Continue Flomax, Will follow up with Urologist next week.  -will schedule Inpatient admission for [facility] on 05/10/2018 for 5 days -outpatient Rituxan and Neulasta on 05/16/2018 -I will see him back in 3 weeks   7) HTN -Continue outpatient antihypertensives and monitor -Discontinued amlodipine due to lower BP in the setting of weight loss -Continue half tablet 76m Lisinopril due to weight loss  9) hemorrhoidal bleeding- resolved. hgb stable. -Continue Prn Anusol   No orders of the defined types were placed in this encounter.   -[facility] -outpatient Rituxan and Neulasta on 05/16/2018 -PRBC transfusion 2 units in the next 1-2 days -RTC with Dr KIrene Limboin 3 weeks with labs   All questions were answered. The patient knows to call the clinic with any problems, questions or concerns.  The total time spent in the appt was 25 minutes and more than 50% was on counseling and direct patient cares.   GSullivan LoneMD MGarnerAAHIVMS SLane County HospitalCLanai Community HospitalHematology/Oncology Physician  [place]  (Office):       [phone] (Work cell):  [phone] (Fax):           [phone]  I, Joslyn Devon, am acting as scribe for Sullivan Lone, MD.   .I have reviewed the above documentation for accuracy and completeness, and I agree with the above.   Brunetta Genera MD

## 2018-05-03 NOTE — Telephone Encounter (Signed)
Per Dr. Irene Limbo: Contacted bed placement, spoke with Jesus [name]. Patient admission scheduled for 05/10/18 for 5 days. Email sent to Inpatient chemotherapy with patient information.

## 2018-05-04 LAB — ABO/RH: ABO/RH(D): A POS

## 2018-05-05 ENCOUNTER — Inpatient Hospital Stay: Payer: 59

## 2018-05-05 DIAGNOSIS — C8338 Diffuse large B-cell lymphoma, lymph nodes of multiple sites: Secondary | ICD-10-CM

## 2018-05-05 LAB — URINALYSIS, COMPLETE (UACMP) WITH MICROSCOPIC
Bilirubin Urine: NEGATIVE
Glucose, UA: NEGATIVE mg/dL
Hgb urine dipstick: NEGATIVE
Ketones, ur: NEGATIVE mg/dL
Leukocytes, UA: NEGATIVE
Nitrite: NEGATIVE
Protein, ur: NEGATIVE mg/dL
Specific Gravity, Urine: 1.009 (ref 1.005–1.030)
pH: 5 (ref 5.0–8.0)

## 2018-05-05 MED ORDER — DIPHENHYDRAMINE HCL 25 MG PO CAPS
25.0000 mg | ORAL_CAPSULE | Freq: Once | ORAL | Status: AC
  Administered 2018-05-05: 25 mg via ORAL

## 2018-05-05 MED ORDER — DIPHENHYDRAMINE HCL 50 MG/ML IJ SOLN
25.0000 mg | Freq: Once | INTRAMUSCULAR | Status: AC
  Administered 2018-05-05: 25 mg via INTRAVENOUS

## 2018-05-05 MED ORDER — SODIUM CHLORIDE 0.9% FLUSH
10.0000 mL | INTRAVENOUS | Status: AC | PRN
  Administered 2018-05-05: 10 mL
  Filled 2018-05-05: qty 10

## 2018-05-05 MED ORDER — METHYLPREDNISOLONE SODIUM SUCC 125 MG IJ SOLR
125.0000 mg | Freq: Once | INTRAMUSCULAR | Status: AC
  Administered 2018-05-05: 125 mg via INTRAVENOUS

## 2018-05-05 MED ORDER — ACETAMINOPHEN 325 MG PO TABS
650.0000 mg | ORAL_TABLET | Freq: Once | ORAL | Status: AC
  Administered 2018-05-05: 650 mg via ORAL

## 2018-05-05 MED ORDER — SODIUM CHLORIDE 0.9% IV SOLUTION
250.0000 mL | Freq: Once | INTRAVENOUS | Status: AC
  Administered 2018-05-05: 250 mL via INTRAVENOUS
  Filled 2018-05-05: qty 250

## 2018-05-05 MED ORDER — HEPARIN SOD (PORK) LOCK FLUSH 100 UNIT/ML IV SOLN
500.0000 [IU] | Freq: Every day | INTRAVENOUS | Status: AC | PRN
  Administered 2018-05-05: 500 [IU]
  Filled 2018-05-05: qty 5

## 2018-05-05 NOTE — Progress Notes (Signed)
10am- Pt developed rigors in his 1st 15 min. Of 1st unit of prbc. VS checked and found pt to have elevated blood pressure and temp. Please see vital signs for details. Pt complains of headache as well. Immediately stopped blood transfusion and initiated 1L bolus of NSS. Notified On call MD on site. Pt was seen and evaluated. Blood bank notified and sent urine sample, 1st unit of remaining prbc, blood product identification tag, blood reaction report and tubes of blood sample. Pt given 30m IV benadryl and 1257mIV solumedrol per on call MD.   1105am- Per recent VS. Pt more stable and now asymptomatic. Per Dr.Gudena, ok to start with 2nd unit of blood (run at slower rate and increase slowly as tolerated.)

## 2018-05-05 NOTE — Progress Notes (Signed)
Patient's 2nd unit of blood started at 1105 and ended at 1300. Patient tolerated this well.

## 2018-05-05 NOTE — Patient Instructions (Signed)
Blood Transfusion, Adult, Care After This sheet gives you information about how to care for yourself after your procedure. Your doctor may also give you more specific instructions. If you have problems or questions, contact your doctor. Follow these instructions at home:   Take over-the-counter and prescription medicines only as told by your doctor.  Go back to your normal activities as told by your doctor.  Follow instructions from your doctor about how to take care of the area where an IV tube was put into your vein (insertion site). Make sure you: ? Wash your hands with soap and water before you change your bandage (dressing). If there is no soap and water, use hand sanitizer. ? Change your bandage as told by your doctor.  Check your IV insertion site every day for signs of infection. Check for: ? More redness, swelling, or pain. ? More fluid or blood. ? Warmth. ? Pus or a bad smell. Contact a doctor if:  You have more redness, swelling, or pain around the IV insertion site.  You have more fluid or blood coming from the IV insertion site.  Your IV insertion site feels warm to the touch.  You have pus or a bad smell coming from the IV insertion site.  Your pee (urine) turns pink, red, or brown.  You feel weak after doing your normal activities. Get help right away if:  You have signs of a serious allergic or body defense (immune) system reaction, including: ? Itchiness. ? Hives. ? Trouble breathing. ? Anxiety. ? Pain in your chest or lower back. ? Fever, flushing, and chills. ? Fast pulse. ? Rash. ? Watery poop (diarrhea). ? Throwing up (vomiting). ? Dark pee. ? Serious headache. ? Dizziness. ? Stiff neck. ? Yellow color in your face or the white parts of your eyes (jaundice). Summary  After a blood transfusion, return to your normal activities as told by your doctor.  Every day, check for signs of infection where the IV tube was put into your vein.  Some  signs of infection are warm skin, more redness and pain, more fluid or blood, and pus or a bad smell where the needle went in.  Contact your doctor if you feel weak or have any unusual symptoms. This information is not intended to replace advice given to you by your health care provider. Make sure you discuss any questions you have with your health care provider. Document Released: 05/16/2014 Document Revised: 12/18/2015 Document Reviewed: 12/18/2015 Elsevier Interactive Patient Education  2019 [employer]  

## 2018-05-07 LAB — TYPE AND SCREEN
ABO/RH(D): A POS
Antibody Screen: NEGATIVE
Unit division: 0
Unit division: 0

## 2018-05-07 LAB — BPAM RBC
Blood Product Expiration Date: 202001172359
Blood Product Expiration Date: 202001172359
ISSUE DATE / TIME: 201912280933
ISSUE DATE / TIME: 201912280933
Unit Type and Rh: 6200
Unit Type and Rh: 6200

## 2018-05-08 LAB — TRANSFUSION REACTION
DAT C3: NEGATIVE
Post RXN DAT IgG: NEGATIVE

## 2018-05-10 ENCOUNTER — Other Ambulatory Visit: Payer: Self-pay

## 2018-05-10 ENCOUNTER — Inpatient Hospital Stay (HOSPITAL_COMMUNITY)
Admission: AD | Admit: 2018-05-10 | Discharge: 2018-05-14 | DRG: 847 | Disposition: A | Discharge status: Home / Self Care | Payer: 59 | Source: Ambulatory Visit | Attending: Hematology | Admitting: Hematology

## 2018-05-10 ENCOUNTER — Encounter (HOSPITAL_COMMUNITY): Payer: Self-pay

## 2018-05-10 DIAGNOSIS — E785 Hyperlipidemia, unspecified: Secondary | ICD-10-CM | POA: Diagnosis present

## 2018-05-10 DIAGNOSIS — D649 Anemia, unspecified: Secondary | ICD-10-CM | POA: Diagnosis not present

## 2018-05-10 DIAGNOSIS — Z833 Family history of diabetes mellitus: Secondary | ICD-10-CM

## 2018-05-10 DIAGNOSIS — Z5111 Encounter for antineoplastic chemotherapy: Principal | ICD-10-CM

## 2018-05-10 DIAGNOSIS — Z87440 Personal history of urinary (tract) infections: Secondary | ICD-10-CM | POA: Diagnosis not present

## 2018-05-10 DIAGNOSIS — Z9189 Other specified personal risk factors, not elsewhere classified: Secondary | ICD-10-CM

## 2018-05-10 DIAGNOSIS — Z823 Family history of stroke: Secondary | ICD-10-CM

## 2018-05-10 DIAGNOSIS — M7989 Other specified soft tissue disorders: Secondary | ICD-10-CM | POA: Diagnosis present

## 2018-05-10 DIAGNOSIS — N400 Enlarged prostate without lower urinary tract symptoms: Secondary | ICD-10-CM | POA: Diagnosis present

## 2018-05-10 DIAGNOSIS — Z8249 Family history of ischemic heart disease and other diseases of the circulatory system: Secondary | ICD-10-CM

## 2018-05-10 DIAGNOSIS — R7303 Prediabetes: Secondary | ICD-10-CM | POA: Diagnosis present

## 2018-05-10 DIAGNOSIS — I10 Essential (primary) hypertension: Secondary | ICD-10-CM

## 2018-05-10 DIAGNOSIS — Z803 Family history of malignant neoplasm of breast: Secondary | ICD-10-CM

## 2018-05-10 DIAGNOSIS — F17210 Nicotine dependence, cigarettes, uncomplicated: Secondary | ICD-10-CM | POA: Diagnosis present

## 2018-05-10 DIAGNOSIS — Z7982 Long term (current) use of aspirin: Secondary | ICD-10-CM

## 2018-05-10 DIAGNOSIS — C8330 Diffuse large B-cell lymphoma, unspecified site: Secondary | ICD-10-CM | POA: Diagnosis present

## 2018-05-10 DIAGNOSIS — E860 Dehydration: Secondary | ICD-10-CM | POA: Diagnosis present

## 2018-05-10 DIAGNOSIS — R739 Hyperglycemia, unspecified: Secondary | ICD-10-CM | POA: Diagnosis present

## 2018-05-10 DIAGNOSIS — C8338 Diffuse large B-cell lymphoma, lymph nodes of multiple sites: Secondary | ICD-10-CM

## 2018-05-10 DIAGNOSIS — R740 Nonspecific elevation of levels of transaminase and lactic acid dehydrogenase [LDH]: Secondary | ICD-10-CM | POA: Diagnosis present

## 2018-05-10 DIAGNOSIS — D6481 Anemia due to antineoplastic chemotherapy: Secondary | ICD-10-CM | POA: Diagnosis present

## 2018-05-10 DIAGNOSIS — C7951 Secondary malignant neoplasm of bone: Secondary | ICD-10-CM | POA: Diagnosis present

## 2018-05-10 DIAGNOSIS — Z800 Family history of malignant neoplasm of digestive organs: Secondary | ICD-10-CM | POA: Diagnosis not present

## 2018-05-10 DIAGNOSIS — Z79899 Other long term (current) drug therapy: Secondary | ICD-10-CM

## 2018-05-10 DIAGNOSIS — Z7189 Other specified counseling: Secondary | ICD-10-CM

## 2018-05-10 DIAGNOSIS — T380X5A Adverse effect of glucocorticoids and synthetic analogues, initial encounter: Secondary | ICD-10-CM | POA: Diagnosis present

## 2018-05-10 LAB — URINALYSIS, COMPLETE (UACMP) WITH MICROSCOPIC
Bacteria, UA: NONE SEEN
Bilirubin Urine: NEGATIVE
Glucose, UA: NEGATIVE mg/dL
Hgb urine dipstick: NEGATIVE
Ketones, ur: NEGATIVE mg/dL
Leukocytes, UA: NEGATIVE
Nitrite: NEGATIVE
Protein, ur: NEGATIVE mg/dL
Specific Gravity, Urine: 1.017 (ref 1.005–1.030)
pH: 5 (ref 5.0–8.0)

## 2018-05-10 LAB — RETICULOCYTES
Immature Retic Fract: 24 % — ABNORMAL HIGH (ref 2.3–15.9)
RBC.: 3.22 MIL/uL — ABNORMAL LOW (ref 4.22–5.81)
Retic Count, Absolute: 124.9 10*3/uL (ref 19.0–186.0)
Retic Ct Pct: 3.9 % — ABNORMAL HIGH (ref 0.4–3.1)

## 2018-05-10 LAB — COMPREHENSIVE METABOLIC PANEL
ALT: 13 U/L (ref 0–44)
AST: 18 U/L (ref 15–41)
Albumin: 3.5 g/dL (ref 3.5–5.0)
Alkaline Phosphatase: 61 U/L (ref 38–126)
BUN: 16 mg/dL (ref 8–23)
CO2: 27 mmol/L (ref 22–32)
Calcium: 9 mg/dL (ref 8.9–10.3)
Chloride: 108 mmol/L (ref 98–111)
Creatinine, Ser: 0.78 mg/dL (ref 0.61–1.24)
GFR calc Af Amer: >60 mL/min (ref >60)
Glucose, Bld: 125 mg/dL — ABNORMAL HIGH (ref 70–99)
Potassium: 3.4 mmol/L — ABNORMAL LOW (ref 3.5–5.1)
Sodium: 143 mmol/L (ref 135–145)
Total Bilirubin: 0.4 mg/dL (ref 0.3–1.2)
Total Protein: 6.3 g/dL — ABNORMAL LOW (ref 6.5–8.1)

## 2018-05-10 LAB — CBC WITH DIFFERENTIAL/PLATELET
Abs Immature Granulocytes: 0.04 10*3/uL (ref 0.00–0.07)
Basophils Absolute: 0 10*3/uL (ref 0.0–0.1)
Basophils Relative: 1 %
Eosinophils Absolute: 0.9 10*3/uL — ABNORMAL HIGH (ref 0.0–0.5)
Eosinophils Relative: 13 %
HCT: 30.7 % — ABNORMAL LOW (ref 39.0–52.0)
Hemoglobin: 9.5 g/dL — ABNORMAL LOW (ref 13.0–17.0)
Immature Granulocytes: 1 %
Lymphocytes Relative: 29 %
Lymphs Abs: 2.1 10*3/uL (ref 0.7–4.0)
MCH: 29.5 pg (ref 26.0–34.0)
MCHC: 30.9 g/dL (ref 30.0–36.0)
MCV: 95.3 fL (ref 80.0–100.0)
Monocytes Absolute: 0.8 10*3/uL (ref 0.1–1.0)
Monocytes Relative: 11 %
Neutro Abs: 3.3 10*3/uL (ref 1.7–7.7)
Neutrophils Relative %: 45 %
Platelets: 277 10*3/uL (ref 150–400)
RBC: 3.22 MIL/uL — ABNORMAL LOW (ref 4.22–5.81)
RDW: 17.4 % — ABNORMAL HIGH (ref 11.5–15.5)
WBC: 7.1 10*3/uL (ref 4.0–10.5)
nRBC: 0 % (ref 0.0–0.2)

## 2018-05-10 LAB — COMPREHENSIVE METABOLIC PANEL WITH GFR
Anion gap: 8 (ref 5–15)
GFR calc non Af Amer: >60 mL/min (ref >60)

## 2018-05-10 MED ORDER — ENOXAPARIN SODIUM 40 MG/0.4ML ~~LOC~~ SOLN
40.0000 mg | SUBCUTANEOUS | Status: DC
  Administered 2018-05-10 – 2018-05-13 (×4): 40 mg via SUBCUTANEOUS
  Filled 2018-05-10 (×4): qty 0.4

## 2018-05-10 MED ORDER — HYDROCORTISONE 2.5 % RE CREA: 1.0000 "application " | TOPICAL_CREAM | Freq: Two times a day (BID) | RECTAL | Status: DC | PRN

## 2018-05-10 MED ORDER — VINCRISTINE SULFATE CHEMO INJECTION 1 MG/ML
Freq: Once | INTRAVENOUS | Status: AC
  Administered 2018-05-10: 18:00:00 via INTRAVENOUS
  Filled 2018-05-10: qty 13

## 2018-05-10 MED ORDER — SODIUM CHLORIDE 0.9 % IV SOLN
Freq: Once | INTRAVENOUS | Status: AC
  Administered 2018-05-10: 18 mg via INTRAVENOUS
  Filled 2018-05-10: qty 4

## 2018-05-10 MED ORDER — SODIUM CHLORIDE 0.9 % IV SOLN
INTRAVENOUS | Status: DC
  Administered 2018-05-10: 17:00:00 via INTRAVENOUS

## 2018-05-10 MED ORDER — TAMSULOSIN HCL 0.4 MG PO CAPS
0.4000 mg | ORAL_CAPSULE | Freq: Every day | ORAL | Status: DC
  Administered 2018-05-10 – 2018-05-13 (×4): 0.4 mg via ORAL
  Filled 2018-05-10 (×4): qty 1

## 2018-05-10 MED ORDER — LISINOPRIL 10 MG PO TABS
10.0000 mg | ORAL_TABLET | Freq: Every day | ORAL | Status: DC
  Administered 2018-05-11: 10 mg via ORAL
  Filled 2018-05-10: qty 1

## 2018-05-10 MED ORDER — PREDNISONE 20 MG PO TABS
60.0000 mg | ORAL_TABLET | Freq: Every day | ORAL | Status: AC
  Administered 2018-05-10 – 2018-05-14 (×5): 60 mg via ORAL
  Filled 2018-05-10 (×5): qty 3

## 2018-05-10 MED ORDER — ASPIRIN 81 MG PO CHEW
81.0000 mg | CHEWABLE_TABLET | Freq: Every day | ORAL | Status: DC
  Administered 2018-05-11 – 2018-05-14 (×4): 81 mg via ORAL
  Filled 2018-05-10 (×4): qty 1

## 2018-05-10 NOTE — Progress Notes (Signed)
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 male who has been admitted today for C5 EPOCH-R treatment of his T-Cell/Histocyte rich Large B-Cell lymphoma. [name].   His C5 was delayed due to E.COli sepsis from UTI. He had previously had am E.COli UTI as well and has some issues with BPH which might be a risk factor.  The pt reports that he is doing very well and denies any concerns with urination and is awaiting a visit with urology next week. He completed his previous course of Bactrim. He notes that his energy levels are good and he has been eating well and feels back to his usual self. He notes that he has some numbness in his finger tips which has not worsened. He denies fevers or chills.   Lab results today (05/10/18) of CBC w/diff and CMP is as follows: all values are WNL except for RBC at 3.22, HGB at 9.5, HCT at 30.7, RDW at 17.4, Eosinophils abs at 900, Potassium at 3.4, Glucose at 125, Total Protein at 6.3.  No rectal bleeding. Did need transfusion of 2 units of PRBC recently.  On review of systems, pt reports good energy levels, eating well, stable numbness in fingertips, and denies fevers, chills, difficulty urinating, abdominal pains, leg swelling, and any other symptoms.   MEDICAL HISTORY:  Past Medical History:  Diagnosis Date  . Allergy   . History of kidney stones   . Hyperlipidemia   . Hypertension   . Lymphadenopathy   . Pain, lower leg    Bilateral  . Peripheral arterial disease ([place])   . Pre-diabetes   . Red-green color blindness   . Snores   . Wears glasses     SURGICAL HISTORY: Past Surgical History:  Procedure Laterality Date  .  CATARACT EXTRACTION W/ INTRAOCULAR LENS  IMPLANT, BILATERAL    . COLONOSCOPY    . dislodged salava stone    . FRACTURE SURGERY    . HAND ARTHROPLASTY  1995   crushed left hand  . INGUINAL LYMPH NODE BIOPSY Left 01/02/2018   Procedure: LEFT INGUINAL LYMPH NODE BIOPSY;  Surgeon: Rolm Bookbinder, MD;  Location: [facility];  Service: General;  Laterality: Left;  . IR IMAGING GUIDED PORT INSERTION  01/15/2018  . MICROLARYNGOSCOPY Left 01/17/2014   Procedure: MICROLARYNGOSCOPY WITH EXCISION OF THE BIOPSY OF LEFT VOCAL CORD LESION;  Surgeon: Izora Gala, MD;  Location: [facility];  Service: ENT;  Laterality: Left;  . ORIF FOOT FRACTURE  2005   left    SOCIAL HISTORY: Social History   Socioeconomic History  . Marital status: Divorced    Spouse name: Not on file  . Number of children: 3  . Years of education: Not on file  . Highest education level: Not on file  Occupational History  . Not on file  Social Needs  . Financial resource strain: Not on file  . Food insecurity:    Worry: Not on file    Inability: Not on file  . Transportation needs:    Medical: Not on file    Non-medical: Not on file  Tobacco Use  . Smoking status: Current Every Day Smoker    Packs/day: 0.50  Years: 36.00    Pack years: 18.00    Types: Cigarettes  . Smokeless tobacco: Never Used  Substance and Sexual Activity  . Alcohol use: Yes    Alcohol/week: 15.0 standard drinks    Types: 10 Cans of beer, 5 Shots of liquor per week    Comment: weekends  . Drug use: Yes    Types: Cocaine    Comment: reports cocaine usage ~2X/ month; last use 12/26/17  . Sexual activity: Not on file  Lifestyle  . Physical activity:    Days per week: Not on file    Minutes per session: Not on file  . Stress: Not on file  Relationships  . Social connections:    Talks on phone: Not on file    Gets together: Not on file    Attends religious service: Not on file    Active member of club or organization: Not on file      Attends meetings of clubs or organizations: Not on file    Relationship status: Not on file  . Intimate partner violence:    Fear of current or ex partner: Not on file    Emotionally abused: Not on file    Physically abused: Not on file    Forced sexual activity: Not on file  Other Topics Concern  . Not on file  Social History Narrative  . Not on file    FAMILY HISTORY: Family History  Problem Relation Age of Onset  . Breast cancer Mother   . Diabetes Father   . Hypertension Father   . Stroke Father   . Mental illness Sister   . Hypertension Daughter   . Mental illness Daughter   . Hypertension Brother   . Colon cancer Brother   . Breast cancer Sister     ALLERGIES:  is allergic to bee venom.  MEDICATIONS:  Current Facility-Administered Medications  Medication Dose Route Frequency Provider Last Rate Last Dose  . 0.9 %  sodium chloride infusion   Intravenous Continuous Brunetta Genera, MD      . Derrill Memo ON 05/11/2018] aspirin chewable tablet 81 mg  81 mg Oral Daily Brunetta Genera, MD      . DOXOrubicin (ADRIAMYCIN) 26 mg, etoposide (VEPESID) 132 mg, vinCRIStine (ONCOVIN) 1 mg in sodium chloride 0.9 % 1,000 mL chemo infusion   Intravenous Once Brunetta Genera, MD      . enoxaparin (LOVENOX) injection 40 mg  40 mg Subcutaneous Q24H Brunetta Genera, MD      . hydrocortisone (ANUSOL-HC) 2.5 % rectal cream 1 application  1 application Rectal BID PRN Brunetta Genera, MD      . Derrill Memo ON 05/11/2018] lisinopril (PRINIVIL,ZESTRIL) tablet 10 mg  10 mg Oral Daily Brunetta Genera, MD      . ondansetron (ZOFRAN) 8 mg, dexamethasone (DECADRON) 10 mg in sodium chloride 0.9 % 50 mL IVPB   Intravenous Once Brunetta Genera, MD      . predniSONE (DELTASONE) tablet 60 mg  60 mg Oral QAC breakfast Brunetta Genera, MD   60 mg at 05/10/18 1537  . tamsulosin (FLOMAX) capsule 0.4 mg  0.4 mg Oral QPC supper Brunetta Genera, MD        REVIEW OF SYSTEMS:     10 Point review of Systems was done is negative except as noted above.  PHYSICAL EXAMINATION: ECOG PERFORMANCE STATUS: 1 - Symptomatic but completely ambulatory  . Vitals:   05/10/18 1157  BP: (!) 155/99  Pulse: 86  Resp: 16  Temp: 97.8 F (36.6 C)  SpO2: 93%   Filed Weights   05/10/18 1157  Weight: 261 lb 1.6 oz (118.4 kg)   .Body mass index is 35.41 kg/m.  GENERAL:alert, in no acute distress and comfortable SKIN: no acute rashes, no significant lesions EYES: conjunctiva are pink and non-injected, sclera anicteric OROPHARYNX: MMM, no exudates, no oropharyngeal erythema or ulceration NECK: supple, no JVD LYMPH:  no palpable lymphadenopathy in the cervical, axillary or inguinal regions LUNGS: clear to auscultation b/l with normal respiratory effort HEART: regular rate & rhythm ABDOMEN:  normoactive bowel sounds , non tender, not distended. Extremity: no pedal edema PSYCH: alert & oriented x 3 with fluent speech NEURO: no focal motor/sensory deficits  LABORATORY DATA:  I have reviewed the data as listed  . CBC Latest Ref Rng & Units 05/10/2018 05/03/2018 04/20/2018  WBC 4.0 - 10.5 K/uL 7.1 9.9 18.2(H)  Hemoglobin 13.0 - 17.0 g/dL 9.5(L) 8.2(L) 8.1(L)  Hematocrit 39.0 - 52.0 % 30.7(L) 26.3(L) 25.8(L)  Platelets 150 - 400 K/uL 277 333 438(H)    . CMP Latest Ref Rng & Units 05/10/2018 05/03/2018 04/19/2018  Glucose 70 - 99 mg/dL 125(H) 165(H) 132(H)  BUN 8 - 23 mg/dL 16 12 15   Creatinine 0.61 - 1.24 mg/dL 0.78 1.09 1.11  Sodium 135 - 145 mmol/L 143 142 140  Potassium 3.5 - 5.1 mmol/L 3.4(L) 3.9 4.0  Chloride 98 - 111 mmol/L 108 109 109  CO2 22 - 32 mmol/L 27 25 23   Calcium 8.9 - 10.3 mg/dL 9.0 9.5 8.7(L)  Total Protein 6.5 - 8.1 g/dL 6.3(L) 6.6 5.8(L)  Total Bilirubin 0.3 - 1.2 mg/dL 0.4 0.2(L) 0.8  Alkaline Phos 38 - 126 U/L 61 90 92  AST 15 - 41 U/L 18 21 23   ALT 0 - 44 U/L 13 11 42     RADIOGRAPHIC STUDIES: I have personally reviewed the radiological  images as listed and agreed with the findings in the report. Ct Head Wo Contrast  Result Date: 04/17/2018 CLINICAL DATA:  Altered mental status, fever. History of lymphoma, hypertension and hyperlipidemia. EXAM: CT HEAD WITHOUT CONTRAST TECHNIQUE: Contiguous axial images were obtained from the base of the skull through the vertex without intravenous contrast. COMPARISON:  None. FINDINGS: BRAIN: No intraparenchymal hemorrhage, mass effect nor midline shift. Subtle bilateral mesial caudothalamic hypodensities. The ventricles and sulci are normal for age. Patchy supratentorial white matter hypodensities. No acute large vascular territory infarcts. No abnormal extra-axial fluid collections. Basal cisterns are patent. VASCULAR: Unremarkable. SKULL: No skull fracture. No significant scalp soft tissue swelling. SINUSES/ORBITS: Trace paranasal sinus mucosal thickening. Mastoid air cells are well aerated.The included ocular globes and orbital contents are non-suspicious. Status post bilateral ocular lens implants. OTHER: None. IMPRESSION: 1. Subtle mesial caudothalamic hypodensities may be artifact though, the could reflect encephalitis or Wernicke's encephalopathy. Consider MRI of the head with and without contrast. 2. Mild chronic small vessel ischemic changes. Electronically Signed   By: Elon Alas M.D.   On: 04/17/2018 17:29   US Renal  Result Date: 04/20/2018 CLINICAL DATA:  Acute kidney injury EXAM: RENAL / URINARY TRACT ULTRASOUND COMPLETE COMPARISON:  Sub-12/22/2017 abdominal CT. FINDINGS: Right Kidney: Renal measurements: 13 x 7 x 8 cm = volume: 420 mL. Scattered simple cysts measuring up to 3.3 cm at the lower pole. No hydronephrosis. Normal cortical echogenicity. Left Kidney: Renal measurements: 13 x 7 x 7 cm = volume: 340 mL. Scattered cortical cysts measuring up to 4.9 cm  lower pole. Also at the lower pole is a cyst that is complicated by internal echogenic material that is nonshadowing. Despite  this internal complexity this cyst had a benign appearance by CT. Given patient's intra-abdominal malignancy there will be continued surveillance of the kidneys by CT. Bladder: Bladder wall thickening that is generalized and nonspecific. No internal debris or discrete mass. IMPRESSION: 1. No hydronephrosis. 2. Mild nonspecific bladder wall thickening. 3. Renal cysts with complicated but benign appearing left lower pole cyst measuring 5.1 cm. Electronically Signed   By: Monte Fantasia M.D.   On: 04/20/2018 09:24   Dg Chest Port 1 View  Result Date: 04/16/2018 CLINICAL DATA:  Shortness of breath, hypertension EXAM: PORTABLE CHEST 1 VIEW COMPARISON:  Portable exam 1620 hours compared to 04/04/2006 FINDINGS: RIGHT jugular Port-A-Cath with tip projecting over SVC near cavoatrial junction. Upper normal size of cardiac silhouette with slight vascular congestion. Mediastinal contours normal. Lungs clear. No infiltrate, pleural effusion or pneumothorax. IMPRESSION: No definite acute abnormalities. Electronically Signed   By: Lavonia Dana M.D.   On: 04/16/2018 17:01    ASSESSMENT & PLAN:  63 y.o. 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  CT of the abdomen and pelvis performed on 12/22/2017 showed bulky left inguinal, left hemipelvic, and retroperitoneal adenopathy.   01/02/18 Left inguinal LN Biopsy revealed T-Cell/histocyte rich Large B-Cell Lymphoma  12/27/17 ECHO revealed LV EF of 55-60%   01/05/18 PET/CT revealedMassively enlarged pelvic lymph nodes intense metabolic activity consistent lymphoma. 2. Additional hypermetabolic lymph nodes in the porta hepatis and retroperitoneum LEFT aorta. 3. Solitary hypermetabolic mediastinal lymph node in the upper LEFT Mediastinum. 4. Multiple discrete  sites of hypermetabolic skeletal metastasis (approximately 5 sites). 5. Normal spleen.  HIV non reactive on 12/22/2017.Hep C and hep B serology negative.  03/14/18 PET/CT revealedPET-CT findings suggest an excellent response to chemotherapy. The abdominal lymphadenopathy has near completely resolved and demonstrates a near complete metabolic response. The pelvic and inguinal adenopathy has significantly decreased in size and the metabolic activity has significantly decreased. 2. Diffuse marrow activity likely due to chemotherapy and or marrow stimulating drugs. I do not see any discrete persistent lesions.   04/17/18 CT Head revealed Subtle mesial caudothalamic hypodensities may be artifact though, the could reflect encephalitis or Wernicke's encephalopathy. Consider MRI of the head with and without contrast. 2. Mild chronic small vessel ischemic changes    2) left lower extremity swelling- now resolved Doppler ultrasound for DVT was negative in the left lower extremity.  Likely from venous compression +/- lymphatic obstruction from bulky left inguinal, left hemipelvic, and retroperitoneal adenopathy.   3) Intermediate to high risk for tumor lysis syndrome.- no TLS noted with allopurinol prophylaxis after C1. Off allopurinol.  4) S/p Port a cath placement   5)h/oE.coli UTI - Pansensitive -Resolved.Also appearedto have BPH like symptomatology. -Was hospitalized 04/16/18 to 04/20/18 for E.coli sepsis from UTI, treated with antibiotics.   6) s/pE.COLi sepsis - likely from urinary source. Recent h/o E.coli UTI Elevated procalcitonin at nearly 20 -CXR - NAI No port a cath site erythema /redness.  7) Symptomatic Anemia Hgb 6.7 - due to chemotherapy/sepsis-  s/p PRBC transfusion. No overt evidence of bleeding. hgb now stable at 9.5  8) Dehydration - AKI creatinine 1.92 Poor po intake + Sepsis. Cannot r/o urinary obstruction from BPH. Resolved creatinine back to baseline  9)  Elevated transaminases  nl ALK PO4 and bilirubin - likely from sepsis. Resolved.  10) DVT prophylaxis -lovenox  11) HTN -on lisinopril and flomax  12) Likely BPH - on flomax  PLAN: -Discussed pt labwork today, 05/10/18; blood counts and chemistries are stable  -The pt has no prohibitive toxicities from continuing C5D1 EPOCH-R at this time.   -outpatient Neulasta and Rituxan on 05/16/2018 -Continue eating well, staying hydrated, and staying as active as reasonably possible  -Will hold IT MTX this cycle given recent infections - patient chooses to hold off on prophylactic CNS treatments completely at this time. -transfuse prn for hgb<8 -will consider prophylactic Abx on discharge given recurrent UTI's  -Discussed that after the pt is finished with chemotherapy treatment, it will be beneficial to have him see urology for evaluation of his prostate and recurrent UTIs - has appointment next week. -chemotherapy orders placed, reviewed and signed and confirmed with pharmacist.   All of the patients questions were answered with apparent satisfaction. The patient knows to call the clinic with any problems, questions or concerns.    Sullivan Lone MD MS AAHIVMS [facility] Hematology/Oncology Physician [facility]  (Office):       [phone] (Work cell):  [phone] (Fax):           [phone]  05/10/2018 5:04 PM  I, Baldwin [place], am acting as a scribe for Dr. Sullivan Lone.   .I have reviewed the above documentation for accuracy and completeness, and I agree with the above. Sullivan Lone MD MS

## 2018-05-11 LAB — COMPREHENSIVE METABOLIC PANEL WITH GFR: Potassium: 3.7 mmol/L (ref 3.5–5.1)

## 2018-05-11 LAB — COMPREHENSIVE METABOLIC PANEL
ALT: 13 U/L (ref 0–44)
AST: 14 U/L — ABNORMAL LOW (ref 15–41)
Albumin: 3.4 g/dL — ABNORMAL LOW (ref 3.5–5.0)
Alkaline Phosphatase: 59 U/L (ref 38–126)
Anion gap: 9 (ref 5–15)
BUN: 14 mg/dL (ref 8–23)
CO2: 25 mmol/L (ref 22–32)
Calcium: 8.6 mg/dL — ABNORMAL LOW (ref 8.9–10.3)
Chloride: 103 mmol/L (ref 98–111)
Creatinine, Ser: 0.79 mg/dL (ref 0.61–1.24)
GFR calc Af Amer: >60 mL/min (ref >60)
GFR calc non Af Amer: >60 mL/min (ref >60)
Glucose, Bld: 178 mg/dL — ABNORMAL HIGH (ref 70–99)
Sodium: 137 mmol/L (ref 135–145)
Total Bilirubin: 0.3 mg/dL (ref 0.3–1.2)
Total Protein: 6.5 g/dL (ref 6.5–8.1)

## 2018-05-11 LAB — CBC
HCT: 31.1 % — ABNORMAL LOW (ref 39.0–52.0)
Hemoglobin: 9.6 g/dL — ABNORMAL LOW (ref 13.0–17.0)
MCH: 28.3 pg (ref 26.0–34.0)
MCHC: 30.9 g/dL (ref 30.0–36.0)
MCV: 91.7 fL (ref 80.0–100.0)
Platelets: 255 10*3/uL (ref 150–400)
RBC: 3.39 MIL/uL — ABNORMAL LOW (ref 4.22–5.81)
RDW: 17.1 % — ABNORMAL HIGH (ref 11.5–15.5)
WBC: 6.3 10*3/uL (ref 4.0–10.5)
nRBC: 0 % (ref 0.0–0.2)

## 2018-05-11 MED ORDER — VINCRISTINE SULFATE CHEMO INJECTION 1 MG/ML
Freq: Once | INTRAVENOUS | Status: AC
  Administered 2018-05-11: 17:00:00 via INTRAVENOUS
  Filled 2018-05-11: qty 13

## 2018-05-11 MED ORDER — CLONIDINE HCL 0.2 MG PO TABS
0.2000 mg | ORAL_TABLET | Freq: Once | ORAL | Status: AC
  Administered 2018-05-12: 0.2 mg via ORAL
  Filled 2018-05-11: qty 1

## 2018-05-11 MED ORDER — SODIUM CHLORIDE 0.9 % IV SOLN
Freq: Once | INTRAVENOUS | Status: AC
  Administered 2018-05-11: 8 mg via INTRAVENOUS
  Filled 2018-05-11: qty 4

## 2018-05-11 NOTE — Progress Notes (Signed)
HEMATOLOGY/ONCOLOGY INPATIENT PROGRESS NOTE  Date of Service: 05/11/2018  Inpatient Attending: .Brunetta Genera, MD   SUBJECTIVE:   Jesus [name] is accompanied today by his daughter at bedside. [name] and notes that he is feeling better than he has in a while.   The pt reports no new urinary symptoms. No fevers/chills/night sweats. No leg swelling. No abdominal pain.  No other prohibitive toxicities from chemotherapy.  Lab results today (05/11/18) of CBC and CMP is as follows: all values are WNL except for RBC at 3.39, HGB at 9.6, HCT at 31.1, RDW at 17.1, Glucose at 178, Calcium at 8.6, Albumin at 3.4, AST at 14.    OBJECTIVE:  NAD  PHYSICAL EXAMINATION: . Vitals:   05/10/18 1157 05/10/18 2154 05/11/18 0547  BP: (!) 155/99 (!) 169/94 (!) 161/95  Pulse: 86 90 70  Resp: 16 18 16   Temp: 97.8 F (36.6 C) 97.7 F (36.5 C) 97.7 F (36.5 C)  TempSrc: Oral Oral Oral  SpO2: 93% 98% 98%  Weight: 261 lb 1.6 oz (118.4 kg)    Height: 6' (1.829 m)     Filed Weights   05/10/18 1157  Weight: 261 lb 1.6 oz (118.4 kg)   .Body mass index is 35.41 kg/m.  GENERAL:alert, in no acute distress and comfortable SKIN: no acute rashes, no significant lesions EYES: conjunctiva are pink and non-injected, sclera anicteric OROPHARYNX: MMM, no exudates, no oropharyngeal erythema or ulceration NECK: supple, no JVD LYMPH:  no palpable lymphadenopathy in the cervical, axillary or inguinal regions LUNGS: clear to auscultation b/l with normal respiratory effort HEART: regular rate & rhythm ABDOMEN:  normoactive bowel sounds , non tender, not distended. No palpable hepatosplenomegaly.  Extremity: no pedal edema PSYCH: alert & oriented x 3 with fluent speech NEURO: no focal motor/sensory deficits   MEDICAL HISTORY:  Past Medical History:  Diagnosis Date  . Allergy   . History of kidney stones   . Hyperlipidemia   . Hypertension   .  Lymphadenopathy   . Pain, lower leg    Bilateral  . Peripheral arterial disease ([place])   . Pre-diabetes   . Red-green color blindness   . Snores   . Wears glasses     SURGICAL HISTORY: Past Surgical History:  Procedure Laterality Date  . CATARACT EXTRACTION W/ INTRAOCULAR LENS  IMPLANT, BILATERAL    . COLONOSCOPY    . dislodged salava stone    . FRACTURE SURGERY    . HAND ARTHROPLASTY  1995   crushed left hand  . INGUINAL LYMPH NODE BIOPSY Left 01/02/2018   Procedure: LEFT INGUINAL LYMPH NODE BIOPSY;  Surgeon: Rolm Bookbinder, MD;  Location: [facility];  Service: General;  Laterality: Left;  . IR IMAGING GUIDED PORT INSERTION  01/15/2018  . MICROLARYNGOSCOPY Left 01/17/2014   Procedure: MICROLARYNGOSCOPY WITH EXCISION OF THE BIOPSY OF LEFT VOCAL CORD LESION;  Surgeon: Izora Gala, MD;  Location: [facility];  Service: ENT;  Laterality: Left;  . ORIF FOOT FRACTURE  2005   left    SOCIAL HISTORY: Social History   Socioeconomic History  . Marital status: Divorced    Spouse name: Not on file  . Number of children: 3  . Years of education: Not on file  . Highest education level: Not on file  Occupational History  . Not on file  Social Needs  . Financial resource strain: Not on file  . Food insecurity:    Worry:  Not on file    Inability: Not on file  . Transportation needs:    Medical: Not on file    Non-medical: Not on file  Tobacco Use  . Smoking status: Current Every Day Smoker    Packs/day: 0.50    Years: 36.00    Pack years: 18.00    Types: Cigarettes  . Smokeless tobacco: Never Used  Substance and Sexual Activity  . Alcohol use: Yes    Alcohol/week: 15.0 standard drinks    Types: 10 Cans of beer, 5 Shots of liquor per week    Comment: weekends  . Drug use: Yes    Types: Cocaine    Comment: reports cocaine usage ~2X/ month; last use 12/26/17  . Sexual activity: Not on file  Lifestyle  . Physical activity:    Days per week: Not on file    Minutes  per session: Not on file  . Stress: Not on file  Relationships  . Social connections:    Talks on phone: Not on file    Gets together: Not on file    Attends religious service: Not on file    Active member of club or organization: Not on file    Attends meetings of clubs or organizations: Not on file    Relationship status: Not on file  . Intimate partner violence:    Fear of current or ex partner: Not on file    Emotionally abused: Not on file    Physically abused: Not on file    Forced sexual activity: Not on file  Other Topics Concern  . Not on file  Social History Narrative  . Not on file    FAMILY HISTORY: Family History  Problem Relation Age of Onset  . Breast cancer Mother   . Diabetes Father   . Hypertension Father   . Stroke Father   . Mental illness Sister   . Hypertension Daughter   . Mental illness Daughter   . Hypertension Brother   . Colon cancer Brother   . Breast cancer Sister     ALLERGIES:  is allergic to bee venom.  MEDICATIONS:  Scheduled Meds: . aspirin  81 mg Oral Daily  . DOXOrubicin/vinCRIStine/etoposide CHEMO IV infusion for Inpatient CI   Intravenous Once  . DOXOrubicin/vinCRIStine/etoposide CHEMO IV infusion for Inpatient CI   Intravenous Once  . enoxaparin (LOVENOX) injection  40 mg Subcutaneous Q24H  . lisinopril  10 mg Oral Daily  . predniSONE  60 mg Oral QAC breakfast  . tamsulosin  0.4 mg Oral QPC supper   Continuous Infusions: . sodium chloride 10 mL/hr at 05/11/18 0500  . ondansetron (ZOFRAN) with dexamethasone (DECADRON) IV     PRN Meds:.hydrocortisone  REVIEW OF SYSTEMS:    10 Point review of Systems was done is negative except as noted above.   LABORATORY DATA:  I have reviewed the data as listed  . CBC Latest Ref Rng & Units 05/11/2018 05/10/2018 05/03/2018  WBC 4.0 - 10.5 K/uL 6.3 7.1 9.9  Hemoglobin 13.0 - 17.0 g/dL 9.6(L) 9.5(L) 8.2(L)  Hematocrit 39.0 - 52.0 % 31.1(L) 30.7(L) 26.3(L)  Platelets 150 - 400 K/uL 255  277 333    . CMP Latest Ref Rng & Units 05/11/2018 05/10/2018 05/03/2018  Glucose 70 - 99 mg/dL 178(H) 125(H) 165(H)  BUN 8 - 23 mg/dL 14 16 12   Creatinine 0.61 - 1.24 mg/dL 0.79 0.78 1.09  Sodium 135 - 145 mmol/L 137 143 142  Potassium 3.5 - 5.1 mmol/L 3.7  3.4(L) 3.9  Chloride 98 - 111 mmol/L 103 108 109  CO2 22 - 32 mmol/L 25 27 25   Calcium 8.9 - 10.3 mg/dL 8.6(L) 9.0 9.5  Total Protein 6.5 - 8.1 g/dL 6.5 6.3(L) 6.6  Total Bilirubin 0.3 - 1.2 mg/dL 0.3 0.4 0.2(L)  Alkaline Phos 38 - 126 U/L 59 61 90  AST 15 - 41 U/L 14(L) 18 21  ALT 0 - 44 U/L 13 13 11      RADIOGRAPHIC STUDIES: I have personally reviewed the radiological images as listed and agreed with the findings in the report. Ct Head Wo Contrast  Result Date: 04/17/2018 CLINICAL DATA:  Altered mental status, fever. History of lymphoma, hypertension and hyperlipidemia. EXAM: CT HEAD WITHOUT CONTRAST TECHNIQUE: Contiguous axial images were obtained from the base of the skull through the vertex without intravenous contrast. COMPARISON:  None. FINDINGS: BRAIN: No intraparenchymal hemorrhage, mass effect nor midline shift. Subtle bilateral mesial caudothalamic hypodensities. The ventricles and sulci are normal for age. Patchy supratentorial white matter hypodensities. No acute large vascular territory infarcts. No abnormal extra-axial fluid collections. Basal cisterns are patent. VASCULAR: Unremarkable. SKULL: No skull fracture. No significant scalp soft tissue swelling. SINUSES/ORBITS: Trace paranasal sinus mucosal thickening. Mastoid air cells are well aerated.The included ocular globes and orbital contents are non-suspicious. Status post bilateral ocular lens implants. OTHER: None. IMPRESSION: 1. Subtle mesial caudothalamic hypodensities may be artifact though, the could reflect encephalitis or Wernicke's encephalopathy. Consider MRI of the head with and without contrast. 2. Mild chronic small vessel ischemic changes. Electronically Signed    By: Elon Alas M.D.   On: 04/17/2018 17:29   US Renal  Result Date: 04/20/2018 CLINICAL DATA:  Acute kidney injury EXAM: RENAL / URINARY TRACT ULTRASOUND COMPLETE COMPARISON:  Sub-12/22/2017 abdominal CT. FINDINGS: Right Kidney: Renal measurements: 13 x 7 x 8 cm = volume: 420 mL. Scattered simple cysts measuring up to 3.3 cm at the lower pole. No hydronephrosis. Normal cortical echogenicity. Left Kidney: Renal measurements: 13 x 7 x 7 cm = volume: 340 mL. Scattered cortical cysts measuring up to 4.9 cm lower pole. Also at the lower pole is a cyst that is complicated by internal echogenic material that is nonshadowing. Despite this internal complexity this cyst had a benign appearance by CT. Given patient's intra-abdominal malignancy there will be continued surveillance of the kidneys by CT. Bladder: Bladder wall thickening that is generalized and nonspecific. No internal debris or discrete mass. IMPRESSION: 1. No hydronephrosis. 2. Mild nonspecific bladder wall thickening. 3. Renal cysts with complicated but benign appearing left lower pole cyst measuring 5.1 cm. Electronically Signed   By: Monte Fantasia M.D.   On: 04/20/2018 09:24   Dg Chest Port 1 View  Result Date: 04/16/2018 CLINICAL DATA:  Shortness of breath, hypertension EXAM: PORTABLE CHEST 1 VIEW COMPARISON:  Portable exam 1620 hours compared to 04/04/2006 FINDINGS: RIGHT jugular Port-A-Cath with tip projecting over SVC near cavoatrial junction. Upper normal size of cardiac silhouette with slight vascular congestion. Mediastinal contours normal. Lungs clear. No infiltrate, pleural effusion or pneumothorax. IMPRESSION: No definite acute abnormalities. Electronically Signed   By: Lavonia Dana M.D.   On: 04/16/2018 17:01    ASSESSMENT & PLAN:  63 y.o. 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  CT of the abdomen and pelvis performed on 12/22/2017 showed bulky left inguinal, left hemipelvic, and retroperitoneal adenopathy.  01/02/18 Left inguinal LN Biopsy revealed T-Cell/histocyte rich Large B-Cell Lymphoma  12/27/17 ECHO revealed LV EF of 55-60%   01/05/18 PET/CT revealedMassively enlarged pelvic lymph nodes intense metabolic activity consistent lymphoma. 2. Additional hypermetabolic lymph nodes in the porta hepatis and retroperitoneum LEFT aorta. 3. Solitary hypermetabolic mediastinal lymph node in the upper LEFT Mediastinum. 4. 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 5. Normal spleen.  HIV non reactive on 12/22/2017.Hep C and hep B serology negative.  03/14/18 PET/CT revealedPET-CT findings suggest an excellent response to chemotherapy. The abdominal lymphadenopathy has near completely resolved and demonstrates a near complete metabolic response. The pelvic and inguinal adenopathy has significantly decreased in size and the metabolic activity has significantly decreased. 2. Diffuse marrow activity likely due to chemotherapy and or marrow stimulating drugs. I do not see any discrete persistent lesions.   04/17/18 CT Head revealedSubtle mesial caudothalamic hypodensities may be artifact though, the could reflect encephalitis or Wernicke's encephalopathy. Consider MRI of the head with and without contrast. 2. Mild chronic small vessel ischemic changes   2) left lower extremity swelling- now resolved Doppler ultrasound for DVT was negative in the left lower extremity.  Likely from venous compression +/- lymphatic obstruction from bulky left inguinal, left hemipelvic, and retroperitoneal adenopathy.   3) Intermediate to high risk for tumor lysis syndrome.- no TLS noted with allopurinol prophylaxis after C1. Off allopurinol.  4)  S/p Port a cath placement   5)h/oE.coli UTI - Pansensitive -Resolved.Also appearedto have BPH like symptomatology. -Was hospitalized 04/16/18 to 04/20/18 for E.coli sepsis from UTI, treated with antibiotics.   6) s/pE.COLi sepsis - likely from urinary source. Recent h/o E.coli UTI Elevated procalcitonin at nearly 20 -CXR - NAI No port a cath site erythema /redness.  7) s/p Symptomatic Anemia Hgb 6.7 - due to chemotherapy/sepsis-  s/p PRBC transfusion. No overt evidence of bleeding. hgb now stable at 9.6  8) DVT prophylaxis -lovenox  11) HTN -on lisinopril and flomax -will monitor and optimize rx accordingly.  12) Likely BPH - on flomax  PLAN: -labs from 05/11/2018 reviewed with patient - stable. -The pt has no prohibitive toxicities from continuing C5D2 EPOCH-R at this time.   -outpatient Neulasta and Rituxan on 05/16/2018 -Will hold IT MTX - patient chooses to hold off on prophylactic CNS treatments completely at this time. -transfuse prn for hgb<8 -will consider prophylactic Abx -likely Bactrim on discharge given recurrent UTI's  -f/u with urology for evaluation of his prostate and recurrent UTIs - has appointment next week. -encouraged continued good po hydration.  The total time spent in the appt was 25 minutes and more than 50% was on counseling and direct patient cares.    Sullivan Lone MD MS AAHIVMS [facility] Hematology/Oncology Physician [facility]  (Office):       [phone] (Work cell):  [phone] (Fax):           [phone]  05/11/2018 1:10 PM   I, Baldwin [place], am acting as a scribe for Dr. Sullivan Lone.   .I have reviewed the above documentation for accuracy and completeness, and I agree with the above. Sullivan Lone MD MS

## 2018-05-11 NOTE — Progress Notes (Signed)
Called to room by pt after he reported that his huber needle had accidentally gotten pulled out. Pumps stopped. Set were never on the floor.   Pac was reaccessed using sterile technique. Good blood R/T. Iv fluids and EPOC were resumed. Huber needle  Is secure

## 2018-05-11 NOTE — H&P (Signed)
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 male who has been admitted today for C5 EPOCH-R treatment of his T-Cell/Histocyte rich Large B-Cell lymphoma. [name].   His C5 was delayed due to E.COli sepsis from UTI. He had previously had am E.COli UTI as well and has some issues with BPH which might be a risk factor.  The pt reports that he is doing very well and denies any concerns with urination and is awaiting a visit with urology next week. He completed his previous course of Bactrim. He notes that his energy levels are good and he has been eating well and feels back to his usual self. He notes that he has some numbness in his finger tips which has not worsened. He denies fevers or chills.   Lab results today (05/10/18) of CBC w/diff and CMP is as follows: all values are WNL except for RBC at 3.22, HGB at 9.5, HCT at 30.7, RDW at 17.4, Eosinophils abs at 900, Potassium at 3.4, Glucose at 125, Total Protein at 6.3.  No rectal bleeding. Did need transfusion of 2 units of PRBC recently.  On review of systems, pt reports good energy levels, eating well, stable numbness in fingertips, and denies fevers, chills, difficulty urinating, abdominal pains, leg swelling, and any other symptoms.   MEDICAL HISTORY:  Past Medical History:  Diagnosis Date  . Allergy   . History of kidney stones   . Hyperlipidemia   . Hypertension   . Lymphadenopathy   . Pain, lower leg    Bilateral  . Peripheral arterial disease ([place])   . Pre-diabetes   . Red-green color blindness   . Snores   . Wears glasses     SURGICAL HISTORY: Past Surgical History:  Procedure Laterality Date  .  CATARACT EXTRACTION W/ INTRAOCULAR LENS  IMPLANT, BILATERAL    . COLONOSCOPY    . dislodged salava stone    . FRACTURE SURGERY    . HAND ARTHROPLASTY  1995   crushed left hand  . INGUINAL LYMPH NODE BIOPSY Left 01/02/2018   Procedure: LEFT INGUINAL LYMPH NODE BIOPSY;  Surgeon: Rolm Bookbinder, MD;  Location: [facility];  Service: General;  Laterality: Left;  . IR IMAGING GUIDED PORT INSERTION  01/15/2018  . MICROLARYNGOSCOPY Left 01/17/2014   Procedure: MICROLARYNGOSCOPY WITH EXCISION OF THE BIOPSY OF LEFT VOCAL CORD LESION;  Surgeon: Izora Gala, MD;  Location: [facility];  Service: ENT;  Laterality: Left;  . ORIF FOOT FRACTURE  2005   left    SOCIAL HISTORY: Social History   Socioeconomic History  . Marital status: Divorced    Spouse name: Not on file  . Number of children: 3  . Years of education: Not on file  . Highest education level: Not on file  Occupational History  . Not on file  Social Needs  . Financial resource strain: Not on file  . Food insecurity:    Worry: Not on file    Inability: Not on file  . Transportation needs:    Medical: Not on file    Non-medical: Not on file  Tobacco Use  . Smoking status: Current Every Day Smoker    Packs/day: 0.50  Years: 36.00    Pack years: 18.00    Types: Cigarettes  . Smokeless tobacco: Never Used  Substance and Sexual Activity  . Alcohol use: Yes    Alcohol/week: 15.0 standard drinks    Types: 10 Cans of beer, 5 Shots of liquor per week    Comment: weekends  . Drug use: Yes    Types: Cocaine    Comment: reports cocaine usage ~2X/ month; last use 12/26/17  . Sexual activity: Not on file  Lifestyle  . Physical activity:    Days per week: Not on file    Minutes per session: Not on file  . Stress: Not on file  Relationships  . Social connections:    Talks on phone: Not on file    Gets together: Not on file    Attends religious service: Not on file    Active member of club or organization: Not on file      Attends meetings of clubs or organizations: Not on file    Relationship status: Not on file  . Intimate partner violence:    Fear of current or ex partner: Not on file    Emotionally abused: Not on file    Physically abused: Not on file    Forced sexual activity: Not on file  Other Topics Concern  . Not on file  Social History Narrative  . Not on file    FAMILY HISTORY: Family History  Problem Relation Age of Onset  . Breast cancer Mother   . Diabetes Father   . Hypertension Father   . Stroke Father   . Mental illness Sister   . Hypertension Daughter   . Mental illness Daughter   . Hypertension Brother   . Colon cancer Brother   . Breast cancer Sister     ALLERGIES:  is allergic to bee venom.  MEDICATIONS:  Current Facility-Administered Medications  Medication Dose Route Frequency Provider Last Rate Last Dose  . 0.9 %  sodium chloride infusion   Intravenous Continuous Brunetta Genera, MD 10 mL/hr at 05/11/18 0500    . aspirin chewable tablet 81 mg  81 mg Oral Daily Brunetta Genera, MD      . DOXOrubicin (ADRIAMYCIN) 26 mg, etoposide (VEPESID) 132 mg, vinCRIStine (ONCOVIN) 1 mg in sodium chloride 0.9 % 1,000 mL chemo infusion   Intravenous Once Brunetta Genera, MD 51 mL/hr at 05/10/18 1814    . enoxaparin (LOVENOX) injection 40 mg  40 mg Subcutaneous Q24H Brunetta Genera, MD   40 mg at 05/10/18 1843  . hydrocortisone (ANUSOL-HC) 2.5 % rectal cream 1 application  1 application Rectal BID PRN Brunetta Genera, MD      . lisinopril (PRINIVIL,ZESTRIL) tablet 10 mg  10 mg Oral Daily Brunetta Genera, MD      . predniSONE (DELTASONE) tablet 60 mg  60 mg Oral QAC breakfast Brunetta Genera, MD   60 mg at 05/11/18 0741  . tamsulosin (FLOMAX) capsule 0.4 mg  0.4 mg Oral QPC supper Brunetta Genera, MD   0.4 mg at 05/10/18 1842    REVIEW OF SYSTEMS:    10 Point review of Systems was done is negative except as noted above.  PHYSICAL  EXAMINATION: ECOG PERFORMANCE STATUS: 1 - Symptomatic but completely ambulatory  . Vitals:   05/10/18 2154 05/11/18 0547  BP: (!) 169/94 (!) 161/95  Pulse: 90 70  Resp: 18 16  Temp: 97.7 F (36.5 C) 97.7 F (36.5 C)  SpO2:  98% 98%   Filed Weights   05/10/18 1157  Weight: 261 lb 1.6 oz (118.4 kg)   .Body mass index is 35.41 kg/m.  GENERAL:alert, in no acute distress and comfortable SKIN: no acute rashes, no significant lesions EYES: conjunctiva are pink and non-injected, sclera anicteric OROPHARYNX: MMM, no exudates, no oropharyngeal erythema or ulceration NECK: supple, no JVD LYMPH:  no palpable lymphadenopathy in the cervical, axillary or inguinal regions LUNGS: clear to auscultation b/l with normal respiratory effort HEART: regular rate & rhythm ABDOMEN:  normoactive bowel sounds , non tender, not distended. Extremity: no pedal edema PSYCH: alert & oriented x 3 with fluent speech NEURO: no focal motor/sensory deficits  LABORATORY DATA:  I have reviewed the data as listed  . CBC Latest Ref Rng & Units 05/11/2018 05/10/2018 05/03/2018  WBC 4.0 - 10.5 K/uL 6.3 7.1 9.9  Hemoglobin 13.0 - 17.0 g/dL 9.6(L) 9.5(L) 8.2(L)  Hematocrit 39.0 - 52.0 % 31.1(L) 30.7(L) 26.3(L)  Platelets 150 - 400 K/uL 255 277 333    . CMP Latest Ref Rng & Units 05/11/2018 05/10/2018 05/03/2018  Glucose 70 - 99 mg/dL 178(H) 125(H) 165(H)  BUN 8 - 23 mg/dL 14 16 12   Creatinine 0.61 - 1.24 mg/dL 0.79 0.78 1.09  Sodium 135 - 145 mmol/L 137 143 142  Potassium 3.5 - 5.1 mmol/L 3.7 3.4(L) 3.9  Chloride 98 - 111 mmol/L 103 108 109  CO2 22 - 32 mmol/L 25 27 25   Calcium 8.9 - 10.3 mg/dL 8.6(L) 9.0 9.5  Total Protein 6.5 - 8.1 g/dL 6.5 6.3(L) 6.6  Total Bilirubin 0.3 - 1.2 mg/dL 0.3 0.4 0.2(L)  Alkaline Phos 38 - 126 U/L 59 61 90  AST 15 - 41 U/L 14(L) 18 21  ALT 0 - 44 U/L 13 13 11      RADIOGRAPHIC STUDIES: I have personally reviewed the radiological images as listed and agreed with the findings in  the report. Ct Head Wo Contrast  Result Date: 04/17/2018 CLINICAL DATA:  Altered mental status, fever. History of lymphoma, hypertension and hyperlipidemia. EXAM: CT HEAD WITHOUT CONTRAST TECHNIQUE: Contiguous axial images were obtained from the base of the skull through the vertex without intravenous contrast. COMPARISON:  None. FINDINGS: BRAIN: No intraparenchymal hemorrhage, mass effect nor midline shift. Subtle bilateral mesial caudothalamic hypodensities. The ventricles and sulci are normal for age. Patchy supratentorial white matter hypodensities. No acute large vascular territory infarcts. No abnormal extra-axial fluid collections. Basal cisterns are patent. VASCULAR: Unremarkable. SKULL: No skull fracture. No significant scalp soft tissue swelling. SINUSES/ORBITS: Trace paranasal sinus mucosal thickening. Mastoid air cells are well aerated.The included ocular globes and orbital contents are non-suspicious. Status post bilateral ocular lens implants. OTHER: None. IMPRESSION: 1. Subtle mesial caudothalamic hypodensities may be artifact though, the could reflect encephalitis or Wernicke's encephalopathy. Consider MRI of the head with and without contrast. 2. Mild chronic small vessel ischemic changes. Electronically Signed   By: Elon Alas M.D.   On: 04/17/2018 17:29   US Renal  Result Date: 04/20/2018 CLINICAL DATA:  Acute kidney injury EXAM: RENAL / URINARY TRACT ULTRASOUND COMPLETE COMPARISON:  Sub-12/22/2017 abdominal CT. FINDINGS: Right Kidney: Renal measurements: 13 x 7 x 8 cm = volume: 420 mL. Scattered simple cysts measuring up to 3.3 cm at the lower pole. No hydronephrosis. Normal cortical echogenicity. Left Kidney: Renal measurements: 13 x 7 x 7 cm = volume: 340 mL. Scattered cortical cysts measuring up to 4.9 cm lower pole. Also at the lower pole is a cyst that is  complicated by internal echogenic material that is nonshadowing. Despite this internal complexity this cyst had a benign  appearance by CT. Given patient's intra-abdominal malignancy there will be continued surveillance of the kidneys by CT. Bladder: Bladder wall thickening that is generalized and nonspecific. No internal debris or discrete mass. IMPRESSION: 1. No hydronephrosis. 2. Mild nonspecific bladder wall thickening. 3. Renal cysts with complicated but benign appearing left lower pole cyst measuring 5.1 cm. Electronically Signed   By: Monte Fantasia M.D.   On: 04/20/2018 09:24   Dg Chest Port 1 View  Result Date: 04/16/2018 CLINICAL DATA:  Shortness of breath, hypertension EXAM: PORTABLE CHEST 1 VIEW COMPARISON:  Portable exam 1620 hours compared to 04/04/2006 FINDINGS: RIGHT jugular Port-A-Cath with tip projecting over SVC near cavoatrial junction. Upper normal size of cardiac silhouette with slight vascular congestion. Mediastinal contours normal. Lungs clear. No infiltrate, pleural effusion or pneumothorax. IMPRESSION: No definite acute abnormalities. Electronically Signed   By: Lavonia Dana M.D.   On: 04/16/2018 17:01    ASSESSMENT & PLAN:  63 y.o. 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  CT of the abdomen and pelvis performed on 12/22/2017 showed bulky left inguinal, left hemipelvic, and retroperitoneal adenopathy.   01/02/18 Left inguinal LN Biopsy revealed T-Cell/histocyte rich Large B-Cell Lymphoma  12/27/17 ECHO revealed LV EF of 55-60%   01/05/18 PET/CT revealedMassively enlarged pelvic lymph nodes intense metabolic activity consistent lymphoma. 2. Additional hypermetabolic lymph nodes in the porta hepatis and retroperitoneum LEFT aorta. 3. Solitary hypermetabolic mediastinal lymph node in the upper LEFT Mediastinum. 4. Multiple discrete sites of hypermetabolic skeletal metastasis  (approximately 5 sites). 5. Normal spleen.  HIV non reactive on 12/22/2017.Hep C and hep B serology negative.  03/14/18 PET/CT revealedPET-CT findings suggest an excellent response to chemotherapy. The abdominal lymphadenopathy has near completely resolved and demonstrates a near complete metabolic response. The pelvic and inguinal adenopathy has significantly decreased in size and the metabolic activity has significantly decreased. 2. Diffuse marrow activity likely due to chemotherapy and or marrow stimulating drugs. I do not see any discrete persistent lesions.   04/17/18 CT Head revealed Subtle mesial caudothalamic hypodensities may be artifact though, the could reflect encephalitis or Wernicke's encephalopathy. Consider MRI of the head with and without contrast. 2. Mild chronic small vessel ischemic changes    2) left lower extremity swelling- now resolved Doppler ultrasound for DVT was negative in the left lower extremity.  Likely from venous compression +/- lymphatic obstruction from bulky left inguinal, left hemipelvic, and retroperitoneal adenopathy.   3) Intermediate to high risk for tumor lysis syndrome.- no TLS noted with allopurinol prophylaxis after C1. Off allopurinol.  4) S/p Port a cath placement   5)h/oE.coli UTI - Pansensitive -Resolved.Also appearedto have BPH like symptomatology. -Was hospitalized 04/16/18 to 04/20/18 for E.coli sepsis from UTI, treated with antibiotics.   6) s/pE.COLi sepsis - likely from urinary source. Recent h/o E.coli UTI Elevated procalcitonin at nearly 20 -CXR - NAI No port a cath site erythema /redness.  7) Symptomatic Anemia Hgb 6.7 - due to chemotherapy/sepsis-  s/p PRBC transfusion. No overt evidence of bleeding. hgb now stable at 9.5  8) Dehydration - AKI creatinine 1.92 Poor po intake + Sepsis. Cannot r/o urinary obstruction from BPH. Resolved creatinine back to baseline  9) Elevated transaminases nl ALK PO4 and  bilirubin - likely from sepsis. Resolved.  10)  DVT prophylaxis -lovenox  11) HTN -on lisinopril and flomax  12) Likely BPH - on flomax  PLAN: -Discussed pt labwork today, 05/10/18; blood counts and chemistries are stable  -The pt has no prohibitive toxicities from continuing C5D1 EPOCH-R at this time.   -outpatient Neulasta and Rituxan on 05/16/2018 -Continue eating well, staying hydrated, and staying as active as reasonably possible  -Will hold IT MTX this cycle given recent infections - patient chooses to hold off on prophylactic CNS treatments completely at this time. -transfuse prn for hgb<8 -will consider prophylactic Abx on discharge given recurrent UTI's  -Discussed that after the pt is finished with chemotherapy treatment, it will be beneficial to have him see urology for evaluation of his prostate and recurrent UTIs - has appointment next week. -chemotherapy orders placed, reviewed and signed and confirmed with pharmacist.   All of the patients questions were answered with apparent satisfaction. The patient knows to call the clinic with any problems, questions or concerns.    Sullivan Lone MD MS AAHIVMS [facility] Hematology/Oncology Physician [facility]  (Office):       [phone] (Work cell):  [phone] (Fax):           [phone]  05/11/2018 10:24 AM  I, Baldwin Jamaica, am acting as a scribe for Dr. Sullivan Lone.   .I have reviewed the above documentation for accuracy and completeness, and I agree with the above. Sullivan Lone MD MS

## 2018-05-12 DIAGNOSIS — I10 Essential (primary) hypertension: Secondary | ICD-10-CM

## 2018-05-12 LAB — CBC
HCT: 30.1 % — ABNORMAL LOW (ref 39.0–52.0)
Hemoglobin: 9.3 g/dL — ABNORMAL LOW (ref 13.0–17.0)
MCH: 28.7 pg (ref 26.0–34.0)
MCHC: 30.9 g/dL (ref 30.0–36.0)
MCV: 92.9 fL (ref 80.0–100.0)
Platelets: 238 10*3/uL (ref 150–400)
RBC: 3.24 MIL/uL — ABNORMAL LOW (ref 4.22–5.81)
RDW: 17.6 % — ABNORMAL HIGH (ref 11.5–15.5)
WBC: 8.6 10*3/uL (ref 4.0–10.5)
nRBC: 0 % (ref 0.0–0.2)

## 2018-05-12 LAB — COMPREHENSIVE METABOLIC PANEL
AST: 25 U/L (ref 15–41)
Albumin: 3.4 g/dL — ABNORMAL LOW (ref 3.5–5.0)
Alkaline Phosphatase: 50 U/L (ref 38–126)
Anion gap: 7 (ref 5–15)
BUN: 17 mg/dL (ref 8–23)
CO2: 26 mmol/L (ref 22–32)
Calcium: 8.8 mg/dL — ABNORMAL LOW (ref 8.9–10.3)
Chloride: 106 mmol/L (ref 98–111)
Creatinine, Ser: 0.83 mg/dL (ref 0.61–1.24)
GFR calc Af Amer: >60 mL/min (ref >60)
GFR calc non Af Amer: >60 mL/min (ref >60)
Glucose, Bld: 259 mg/dL — ABNORMAL HIGH (ref 70–99)
Potassium: 3.7 mmol/L (ref 3.5–5.1)
Sodium: 139 mmol/L (ref 135–145)
Total Bilirubin: 0.5 mg/dL (ref 0.3–1.2)
Total Protein: 6.1 g/dL — ABNORMAL LOW (ref 6.5–8.1)

## 2018-05-12 LAB — COMPREHENSIVE METABOLIC PANEL WITH GFR: ALT: 18 U/L (ref 0–44)

## 2018-05-12 MED ORDER — SODIUM CHLORIDE 0.9 % IV SOLN
Freq: Once | INTRAVENOUS | Status: AC
  Administered 2018-05-13: 8 mg via INTRAVENOUS
  Filled 2018-05-12: qty 4

## 2018-05-12 MED ORDER — AMLODIPINE BESYLATE 5 MG PO TABS
5.0000 mg | ORAL_TABLET | Freq: Every day | ORAL | Status: DC
  Administered 2018-05-12 – 2018-05-14 (×3): 5 mg via ORAL
  Filled 2018-05-12 (×3): qty 1

## 2018-05-12 MED ORDER — VINCRISTINE SULFATE CHEMO INJECTION 1 MG/ML
Freq: Once | INTRAVENOUS | Status: AC
  Administered 2018-05-12: 15:00:00 via INTRAVENOUS
  Filled 2018-05-12: qty 13

## 2018-05-12 MED ORDER — VINCRISTINE SULFATE CHEMO INJECTION 1 MG/ML
Freq: Once | INTRAVENOUS | Status: AC
  Administered 2018-05-13: 13:00:00 via INTRAVENOUS
  Filled 2018-05-12: qty 13

## 2018-05-12 MED ORDER — LISINOPRIL 20 MG PO TABS
20.0000 mg | ORAL_TABLET | Freq: Every day | ORAL | Status: DC
  Administered 2018-05-12 – 2018-05-14 (×3): 20 mg via ORAL
  Filled 2018-05-12 (×3): qty 1

## 2018-05-12 MED ORDER — SODIUM CHLORIDE 0.9 % IV SOLN
Freq: Once | INTRAVENOUS | Status: AC
  Administered 2018-05-12: 8 mg via INTRAVENOUS
  Filled 2018-05-12: qty 4

## 2018-05-12 NOTE — Progress Notes (Signed)
Chemotherapy dosage and calculations checked and reviewed with Nancy Marus, RN.

## 2018-05-12 NOTE — Progress Notes (Signed)
HEMATOLOGY/ONCOLOGY INPATIENT PROGRESS NOTE  Date of Service: 05/12/2018  Inpatient Attending: .Brunetta Genera, MD   SUBJECTIVE:   [name] was seen in follow-up today.  Notes no acute new symptoms.  No nausea no vomiting.  No fevers no chills no abdominal pain.  No dysuria.  No other acute new symptoms. Blood pressures are running higher in the setting of increased oral intake and increased salt consumption over the holidays and the use of steroids with his chemotherapy. Lisinopril was increased to 20 mg which is his baseline dose and restarted his amlodipine at 5 mg daily. Did receive clonidine overnight x1 for elevated blood pressure.   OBJECTIVE:  NAD  PHYSICAL EXAMINATION: . Vitals:   05/11/18 2213 05/11/18 2345 05/12/18 0148 05/12/18 0628  BP: (!) 189/98 (!) 169/84 (!) 166/82 (!) 161/88  Pulse: 87  60 62  Resp: 20   20  Temp: 98.2 F (36.8 C)   97.6 F (36.4 C)  TempSrc: Oral   Oral  SpO2: 98%   100%  Weight:      Height:       Filed Weights   05/10/18 1157  Weight: 261 lb 1.6 oz (118.4 kg)   .Body mass index is 35.41 kg/m.  Marland Kitchen GENERAL:alert, in no acute distress and comfortable SKIN: no acute rashes, no significant lesions EYES: conjunctiva are pink and non-injected, sclera anicteric OROPHARYNX: MMM, no exudates, no oropharyngeal erythema or ulceration NECK: supple, no JVD LYMPH:  no palpable lymphadenopathy in the cervical, axillary or inguinal regions LUNGS: clear to auscultation b/l with normal respiratory effort HEART: regular rate & rhythm ABDOMEN:  normoactive bowel sounds , non tender, not distended. Extremity: no pedal edema PSYCH: alert & oriented x 3 with fluent speech NEURO: no focal motor/sensory deficits   MEDICAL HISTORY:  Past Medical History:  Diagnosis Date  . Allergy   . History of kidney stones   . Hyperlipidemia   . Hypertension   . Lymphadenopathy   . Pain, lower leg    Bilateral  . Peripheral arterial disease  ([place])   . Pre-diabetes   . Red-green color blindness   . Snores   . Wears glasses     SURGICAL HISTORY: Past Surgical History:  Procedure Laterality Date  . CATARACT EXTRACTION W/ INTRAOCULAR LENS  IMPLANT, BILATERAL    . COLONOSCOPY    . dislodged salava stone    . FRACTURE SURGERY    . HAND ARTHROPLASTY  1995   crushed left hand  . INGUINAL LYMPH NODE BIOPSY Left 01/02/2018   Procedure: LEFT INGUINAL LYMPH NODE BIOPSY;  Surgeon: Rolm Bookbinder, MD;  Location: [facility];  Service: General;  Laterality: Left;  . IR IMAGING GUIDED PORT INSERTION  01/15/2018  . MICROLARYNGOSCOPY Left 01/17/2014   Procedure: MICROLARYNGOSCOPY WITH EXCISION OF THE BIOPSY OF LEFT VOCAL CORD LESION;  Surgeon: Izora Gala, MD;  Location: [facility];  Service: ENT;  Laterality: Left;  . ORIF FOOT FRACTURE  2005   left    SOCIAL HISTORY: Social History   Socioeconomic History  . Marital status: Divorced    Spouse name: Not on file  . Number of children: 3  . Years of education: Not on file  . Highest education level: Not on file  Occupational History  . Not on file  Social Needs  . Financial resource strain: Not on file  . Food insecurity:    Worry: Not on file    Inability: Not on file  .  Transportation needs:    Medical: Not on file    Non-medical: Not on file  Tobacco Use  . Smoking status: Current Every Day Smoker    Packs/day: 0.50    Years: 36.00    Pack years: 18.00    Types: Cigarettes  . Smokeless tobacco: Never Used  Substance and Sexual Activity  . Alcohol use: Yes    Alcohol/week: 15.0 standard drinks    Types: 10 Cans of beer, 5 Shots of liquor per week    Comment: weekends  . Drug use: Yes    Types: Cocaine    Comment: reports cocaine usage ~2X/ month; last use 12/26/17  . Sexual activity: Not on file  Lifestyle  . Physical activity:    Days per week: Not on file    Minutes per session: Not on file  . Stress: Not on file  Relationships  . Social  connections:    Talks on phone: Not on file    Gets together: Not on file    Attends religious service: Not on file    Active member of club or organization: Not on file    Attends meetings of clubs or organizations: Not on file    Relationship status: Not on file  . Intimate partner violence:    Fear of current or ex partner: Not on file    Emotionally abused: Not on file    Physically abused: Not on file    Forced sexual activity: Not on file  Other Topics Concern  . Not on file  Social History Narrative  . Not on file    FAMILY HISTORY: Family History  Problem Relation Age of Onset  . Breast cancer Mother   . Diabetes Father   . Hypertension Father   . Stroke Father   . Mental illness Sister   . Hypertension Daughter   . Mental illness Daughter   . Hypertension Brother   . Colon cancer Brother   . Breast cancer Sister     ALLERGIES:  is allergic to bee venom.  MEDICATIONS:  Scheduled Meds: . aspirin  81 mg Oral Daily  . DOXOrubicin/vinCRIStine/etoposide CHEMO IV infusion for Inpatient CI   Intravenous Once  . enoxaparin (LOVENOX) injection  40 mg Subcutaneous Q24H  . lisinopril  10 mg Oral Daily  . predniSONE  60 mg Oral QAC breakfast  . tamsulosin  0.4 mg Oral QPC supper   Continuous Infusions: . sodium chloride 10 mL/hr at 05/12/18 0155   PRN Meds:.hydrocortisone  REVIEW OF SYSTEMS:    10 Point review of Systems was done is negative except as noted above.   LABORATORY DATA:  I have reviewed the data as listed  . CBC Latest Ref Rng & Units 05/12/2018 05/11/2018 05/10/2018  WBC 4.0 - 10.5 K/uL 8.6 6.3 7.1  Hemoglobin 13.0 - 17.0 g/dL 9.3(L) 9.6(L) 9.5(L)  Hematocrit 39.0 - 52.0 % 30.1(L) 31.1(L) 30.7(L)  Platelets 150 - 400 K/uL 238 255 277    . CMP Latest Ref Rng & Units 05/12/2018 05/11/2018 05/10/2018  Glucose 70 - 99 mg/dL 259(H) 178(H) 125(H)  BUN 8 - 23 mg/dL 17 14 16   Creatinine 0.61 - 1.24 mg/dL 0.83 0.79 0.78  Sodium 135 - 145 mmol/L 139 137 143    Potassium 3.5 - 5.1 mmol/L 3.7 3.7 3.4(L)  Chloride 98 - 111 mmol/L 106 103 108  CO2 22 - 32 mmol/L 26 25 27   Calcium 8.9 - 10.3 mg/dL 8.8(L) 8.6(L) 9.0  Total Protein 6.5 -  8.1 g/dL 6.1(L) 6.5 6.3(L)  Total Bilirubin 0.3 - 1.2 mg/dL 0.5 0.3 0.4  Alkaline Phos 38 - 126 U/L 50 59 61  AST 15 - 41 U/L 25 14(L) 18  ALT 0 - 44 U/L 18 13 13      RADIOGRAPHIC STUDIES: I have personally reviewed the radiological images as listed and agreed with the findings in the report. Ct Head Wo Contrast  Result Date: 04/17/2018 CLINICAL DATA:  Altered mental status, fever. History of lymphoma, hypertension and hyperlipidemia. EXAM: CT HEAD WITHOUT CONTRAST TECHNIQUE: Contiguous axial images were obtained from the base of the skull through the vertex without intravenous contrast. COMPARISON:  None. FINDINGS: BRAIN: No intraparenchymal hemorrhage, mass effect nor midline shift. Subtle bilateral mesial caudothalamic hypodensities. The ventricles and sulci are normal for age. Patchy supratentorial white matter hypodensities. No acute large vascular territory infarcts. No abnormal extra-axial fluid collections. Basal cisterns are patent. VASCULAR: Unremarkable. SKULL: No skull fracture. No significant scalp soft tissue swelling. SINUSES/ORBITS: Trace paranasal sinus mucosal thickening. Mastoid air cells are well aerated.The included ocular globes and orbital contents are non-suspicious. Status post bilateral ocular lens implants. OTHER: None. IMPRESSION: 1. Subtle mesial caudothalamic hypodensities may be artifact though, the could reflect encephalitis or Wernicke's encephalopathy. Consider MRI of the head with and without contrast. 2. Mild chronic small vessel ischemic changes. Electronically Signed   By: Elon Alas M.D.   On: 04/17/2018 17:29   US Renal  Result Date: 04/20/2018 CLINICAL DATA:  Acute kidney injury EXAM: RENAL / URINARY TRACT ULTRASOUND COMPLETE COMPARISON:  Sub-12/22/2017 abdominal CT.  FINDINGS: Right Kidney: Renal measurements: 13 x 7 x 8 cm = volume: 420 mL. Scattered simple cysts measuring up to 3.3 cm at the lower pole. No hydronephrosis. Normal cortical echogenicity. Left Kidney: Renal measurements: 13 x 7 x 7 cm = volume: 340 mL. Scattered cortical cysts measuring up to 4.9 cm lower pole. Also at the lower pole is a cyst that is complicated by internal echogenic material that is nonshadowing. Despite this internal complexity this cyst had a benign appearance by CT. Given patient's intra-abdominal malignancy there will be continued surveillance of the kidneys by CT. Bladder: Bladder wall thickening that is generalized and nonspecific. No internal debris or discrete mass. IMPRESSION: 1. No hydronephrosis. 2. Mild nonspecific bladder wall thickening. 3. Renal cysts with complicated but benign appearing left lower pole cyst measuring 5.1 cm. Electronically Signed   By: Monte Fantasia M.D.   On: 04/20/2018 09:24   Dg Chest Port 1 View  Result Date: 04/16/2018 CLINICAL DATA:  Shortness of breath, hypertension EXAM: PORTABLE CHEST 1 VIEW COMPARISON:  Portable exam 1620 hours compared to 04/04/2006 FINDINGS: RIGHT jugular Port-A-Cath with tip projecting over SVC near cavoatrial junction. Upper normal size of cardiac silhouette with slight vascular congestion. Mediastinal contours normal. Lungs clear. No infiltrate, pleural effusion or pneumothorax. IMPRESSION: No definite acute abnormalities. Electronically Signed   By: Lavonia Dana M.D.   On: 04/16/2018 17:01    ASSESSMENT & PLAN:   63 y.o. 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  CT of the abdomen and pelvis performed on 12/22/2017 showed bulky left inguinal, left hemipelvic, and retroperitoneal adenopathy.   01/02/18  Left inguinal LN Biopsy revealed T-Cell/histocyte rich Large B-Cell Lymphoma  12/27/17 ECHO revealed LV EF of 55-60%   01/05/18 PET/CT revealedMassively enlarged pelvic lymph nodes intense metabolic activity  consistent lymphoma. 2. Additional hypermetabolic lymph nodes in the porta hepatis and retroperitoneum LEFT aorta. 3. Solitary hypermetabolic mediastinal lymph node in the upper LEFT Mediastinum. 4. 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 5. Normal spleen.  HIV non reactive on 12/22/2017.Hep C and hep B serology negative.  03/14/18 PET/CT revealedPET-CT findings suggest an excellent response to chemotherapy. The abdominal lymphadenopathy has near completely resolved and demonstrates a near complete metabolic response. The pelvic and inguinal adenopathy has significantly decreased in size and the metabolic activity has significantly decreased. 2. Diffuse marrow activity likely due to chemotherapy and or marrow stimulating drugs. I do not see any discrete persistent lesions.   04/17/18 CT Head revealedSubtle mesial caudothalamic hypodensities may be artifact though, the could reflect encephalitis or Wernicke's encephalopathy. Consider MRI of the head with and without contrast. 2. Mild chronic small vessel ischemic changes   2) left lower extremity swelling- now resolved Doppler ultrasound for DVT was negative in the left lower extremity.  Likely from venous compression +/- lymphatic obstruction from bulky left inguinal, left hemipelvic, and retroperitoneal adenopathy.   3) S/p Port a cath placement   4)h/oE.coli UTI - Pansensitive -Resolved.Also appearedto have BPH like symptomatology. -Was hospitalized 04/16/18 to 04/20/18 for E.coli sepsis from UTI, treated with antibiotics.   5) s/pE.COLi sepsis - likely from urinary source. Recent h/o E.coli UTI  6) s/p Symptomatic Anemia Hgb 6.7 - due to chemotherapy/sepsis-  s/p PRBC  transfusion. No overt evidence of bleeding. hgb now stable at 9.6  7) DVT prophylaxis -lovenox  8) HTN-increasing again in the setting of improved p.o. intake increased salt intake and the use of steroids. -on lisinopril -dose increased to 20 mg p.o. daily today -Continue Flomax for his BPH. -Added back amlodipine at 5 mg p.o. daily plan to increase to 10 mg if blood pressure still elevated. -will monitor and optimize rx accordingly.  9) Likely BPH - on flomax  PLAN: -labs from 05/12/2018 reviewed with patient - stable. -Mild hyperglycemia in the 200s likely due to steroids and his prediabetes.  Recommended avoiding excessive sugary juices or foods. -Lower salt intake. -Lisinopril and amlodipine adjusted to optimize blood pressure control -The pt has no prohibitive toxicities from continuing C5D3 EPOCH-R at this time.   -outpatient Neulasta and Rituxan on 05/16/2018 -transfuse prn for hgb<8 -will consider prophylactic Abx -likely Bactrim on discharge given recurrent UTI's  -f/u with urology for evaluation of his prostate and recurrent UTIs - has appointment next week. -encouraged continued good po hydration.    Sullivan Lone MD [place] AAHIVMS [facility] Hematology/Oncology Physician [facility]  (Office):       [phone] (Work cell):  [phone] (Fax):           [phone]  05/12/2018 8:18 AM

## 2018-05-13 LAB — COMPREHENSIVE METABOLIC PANEL
ALT: 21 U/L (ref 0–44)
AST: 22 U/L (ref 15–41)
Albumin: 3.5 g/dL (ref 3.5–5.0)
Alkaline Phosphatase: 50 U/L (ref 38–126)
Anion gap: 8 (ref 5–15)
BUN: 16 mg/dL (ref 8–23)
CO2: 27 mmol/L (ref 22–32)
Calcium: 8.9 mg/dL (ref 8.9–10.3)
Chloride: 106 mmol/L (ref 98–111)
Creatinine, Ser: 0.75 mg/dL (ref 0.61–1.24)
GFR calc Af Amer: >60 mL/min (ref >60)
GFR calc non Af Amer: >60 mL/min (ref >60)
Glucose, Bld: 183 mg/dL — ABNORMAL HIGH (ref 70–99)
Sodium: 141 mmol/L (ref 135–145)

## 2018-05-13 LAB — COMPREHENSIVE METABOLIC PANEL WITH GFR
Potassium: 3.6 mmol/L (ref 3.5–5.1)
Total Bilirubin: 0.6 mg/dL (ref 0.3–1.2)
Total Protein: 6.2 g/dL — ABNORMAL LOW (ref 6.5–8.1)

## 2018-05-13 LAB — CBC
HCT: 31.5 % — ABNORMAL LOW (ref 39.0–52.0)
Hemoglobin: 9.7 g/dL — ABNORMAL LOW (ref 13.0–17.0)
MCH: 28.5 pg (ref 26.0–34.0)
MCHC: 30.8 g/dL (ref 30.0–36.0)
MCV: 92.6 fL (ref 80.0–100.0)
Platelets: 238 10*3/uL (ref 150–400)
RBC: 3.4 MIL/uL — ABNORMAL LOW (ref 4.22–5.81)
RDW: 17.7 % — ABNORMAL HIGH (ref 11.5–15.5)
WBC: 7.1 10*3/uL (ref 4.0–10.5)
nRBC: 0 % (ref 0.0–0.2)

## 2018-05-13 NOTE — Progress Notes (Signed)
Chemotherapy dosage and calculations chaecked and reviewed with Nancy Marus RN.

## 2018-05-14 LAB — COMPREHENSIVE METABOLIC PANEL
ALT: 20 U/L (ref 0–44)
AST: 16 U/L (ref 15–41)
Alkaline Phosphatase: 51 U/L (ref 38–126)
Anion gap: 6 (ref 5–15)
BUN: 18 mg/dL (ref 8–23)
CO2: 29 mmol/L (ref 22–32)
Calcium: 9 mg/dL (ref 8.9–10.3)
Creatinine, Ser: 0.74 mg/dL (ref 0.61–1.24)
GFR calc Af Amer: >60 mL/min (ref >60)
GFR calc non Af Amer: >60 mL/min (ref >60)
Glucose, Bld: 209 mg/dL — ABNORMAL HIGH (ref 70–99)
Potassium: 3.5 mmol/L (ref 3.5–5.1)
Sodium: 139 mmol/L (ref 135–145)
Total Bilirubin: 0.7 mg/dL (ref 0.3–1.2)
Total Protein: 6.1 g/dL — ABNORMAL LOW (ref 6.5–8.1)

## 2018-05-14 LAB — COMPREHENSIVE METABOLIC PANEL WITH GFR
Albumin: 3.5 g/dL (ref 3.5–5.0)
Chloride: 104 mmol/L (ref 98–111)

## 2018-05-14 LAB — CBC
HCT: 30.8 % — ABNORMAL LOW (ref 39.0–52.0)
Hemoglobin: 9.7 g/dL — ABNORMAL LOW (ref 13.0–17.0)
MCH: 29.1 pg (ref 26.0–34.0)
MCHC: 31.5 g/dL (ref 30.0–36.0)
MCV: 92.5 fL (ref 80.0–100.0)
Platelets: 215 10*3/uL (ref 150–400)
RBC: 3.33 MIL/uL — ABNORMAL LOW (ref 4.22–5.81)
RDW: 17.2 % — ABNORMAL HIGH (ref 11.5–15.5)
WBC: 5.9 10*3/uL (ref 4.0–10.5)
nRBC: 0 % (ref 0.0–0.2)

## 2018-05-14 MED ORDER — LISINOPRIL 20 MG PO TABS: 20.0000 mg | ORAL_TABLET | Freq: Every day | ORAL | 1 refills | Status: DC

## 2018-05-14 MED ORDER — SODIUM CHLORIDE 0.9 % IV SOLN
Freq: Once | INTRAVENOUS | Status: AC
  Administered 2018-05-14: 16 mg via INTRAVENOUS
  Filled 2018-05-14: qty 8

## 2018-05-14 MED ORDER — SULFAMETHOXAZOLE-TRIMETHOPRIM 800-160 MG PO TABS: 1.0000 | ORAL_TABLET | Freq: Every day | ORAL | 1 refills | Status: DC

## 2018-05-14 MED ORDER — AMLODIPINE BESYLATE 10 MG PO TABS: 10.0000 mg | ORAL_TABLET | Freq: Every day | ORAL | 2 refills | Status: DC

## 2018-05-14 MED ORDER — SODIUM CHLORIDE 0.9 % IV SOLN
750.0000 mg/m2 | Freq: Once | INTRAVENOUS | Status: AC
  Administered 2018-05-14: 1980 mg via INTRAVENOUS
  Filled 2018-05-14: qty 99

## 2018-05-14 NOTE — Progress Notes (Signed)
Chemotherapy dosage verified with Aldean Baker, RN.

## 2018-05-14 NOTE — Discharge Summary (Signed)
[place]  Telephone:([phone]) [phone] [phone]    Physician Discharge Summary     Patient ID: Jesus [name] MRN: [id number] DOB/AGE: 1956-02-06 63 y.o.  Admit date: 05/10/2018 Discharge date: 05/14/2018  Primary Care Physician:  Shirline Frees, MD   Discharge Diagnoses:    Present on Admission: . Large cell (diffuse) non-Hodgkin's lymphoma [facility])   Discharge Medications:  Allergies as of 05/14/2018      Reactions   Bee Venom Anaphylaxis      Medication List    STOP taking these medications   CIALIS 20 MG tablet Generic drug:  tadalafil     TAKE these medications   acetaminophen 500 MG tablet Commonly known as:  TYLENOL Take 500 mg by mouth every 6 (six) hours as needed for mild pain.   amLODipine 10 MG tablet Commonly known as:  NORVASC Take 1 tablet (10 mg total) by mouth daily. Start taking on:  May 15, 2018   aspirin 81 MG tablet Take 81 mg by mouth daily.   dexamethasone 4 MG tablet Commonly known as:  DECADRON Take 2 tablets (8 mg total) by mouth 2 (two) times daily with a meal. Take two times a day starting the day after chemotherapy for 3 days.   hydrocortisone 2.5 % rectal cream Commonly known as:  ANUSOL-HC Place 1 application rectally 2 (two) times daily as needed for hemorrhoids.   lisinopril 20 MG tablet Commonly known as:  PRINIVIL,ZESTRIL Take 1 tablet (20 mg total) by mouth daily. What changed:  how much to take   LORazepam 0.5 MG tablet Commonly known as:  ATIVAN Take 1 tablet (0.5 mg total) by mouth every 6 (six) hours as needed (Nausea or vomiting).   multivitamin with minerals tablet Take 1 tablet by mouth daily.   prochlorperazine 10 MG tablet Commonly known as:  COMPAZINE Take 1 tablet (10 mg total) by mouth every 6 (six) hours as needed (Nausea or vomiting).   sulfamethoxazole-trimethoprim 800-160 MG tablet Commonly known as:  BACTRIM DS,SEPTRA DS Take 1 tablet by mouth daily.     tamsulosin 0.4 MG Caps capsule Commonly known as:  FLOMAX Take 1 capsule (0.4 mg total) by mouth daily after supper.        Disposition and Follow-up:   Significant Diagnostic Studies:  Ct Head Wo Contrast  Result Date: 04/17/2018 CLINICAL DATA:  Altered mental status, fever. History of lymphoma, hypertension and hyperlipidemia. EXAM: CT HEAD WITHOUT CONTRAST TECHNIQUE: Contiguous axial images were obtained from the base of the skull through the vertex without intravenous contrast. COMPARISON:  None. FINDINGS: BRAIN: No intraparenchymal hemorrhage, mass effect nor midline shift. Subtle bilateral mesial caudothalamic hypodensities. The ventricles and sulci are normal for age. Patchy supratentorial white matter hypodensities. No acute large vascular territory infarcts. No abnormal extra-axial fluid collections. Basal cisterns are patent. VASCULAR: Unremarkable. SKULL: No skull fracture. No significant scalp soft tissue swelling. SINUSES/ORBITS: Trace paranasal sinus mucosal thickening. Mastoid air cells are well aerated.The included ocular globes and orbital contents are non-suspicious. Status post bilateral ocular lens implants. OTHER: None. IMPRESSION: 1. Subtle mesial caudothalamic hypodensities may be artifact though, the could reflect encephalitis or Wernicke's encephalopathy. Consider MRI of the head with and without contrast. 2. Mild chronic small vessel ischemic changes. Electronically Signed   By: Elon Alas M.D.   On: 04/17/2018 17:29   US Renal  Result Date: 04/20/2018 CLINICAL DATA:  Acute kidney injury EXAM: RENAL / URINARY TRACT ULTRASOUND COMPLETE COMPARISON:  Sub-12/22/2017 abdominal  CT. FINDINGS: Right Kidney: Renal measurements: 13 x 7 x 8 cm = volume: 420 mL. Scattered simple cysts measuring up to 3.3 cm at the lower pole. No hydronephrosis. Normal cortical echogenicity. Left Kidney: Renal measurements: 13 x 7 x 7 cm = volume: 340 mL. Scattered cortical cysts measuring up  to 4.9 cm lower pole. Also at the lower pole is a cyst that is complicated by internal echogenic material that is nonshadowing. Despite this internal complexity this cyst had a benign appearance by CT. Given patient's intra-abdominal malignancy there will be continued surveillance of the kidneys by CT. Bladder: Bladder wall thickening that is generalized and nonspecific. No internal debris or discrete mass. IMPRESSION: 1. No hydronephrosis. 2. Mild nonspecific bladder wall thickening. 3. Renal cysts with complicated but benign appearing left lower pole cyst measuring 5.1 cm. Electronically Signed   By: Monte Fantasia M.D.   On: 04/20/2018 09:24   Dg Chest Port 1 View  Result Date: 04/16/2018 CLINICAL DATA:  Shortness of breath, hypertension EXAM: PORTABLE CHEST 1 VIEW COMPARISON:  Portable exam 1620 hours compared to 04/04/2006 FINDINGS: RIGHT jugular Port-A-Cath with tip projecting over SVC near cavoatrial junction. Upper normal size of cardiac silhouette with slight vascular congestion. Mediastinal contours normal. Lungs clear. No infiltrate, pleural effusion or pneumothorax. IMPRESSION: No definite acute abnormalities. Electronically Signed   By: Lavonia Dana M.D.   On: 04/16/2018 17:01    Discharge Laboratory Values: . CBC Latest Ref Rng & Units 05/14/2018 05/13/2018 05/12/2018  WBC 4.0 - 10.5 K/uL 5.9 7.1 8.6  Hemoglobin 13.0 - 17.0 g/dL 9.7(L) 9.7(L) 9.3(L)  Hematocrit 39.0 - 52.0 % 30.8(L) 31.5(L) 30.1(L)  Platelets 150 - 400 K/uL 215 238 238   . CMP Latest Ref Rng & Units 05/14/2018 05/13/2018 05/12/2018  Glucose 70 - 99 mg/dL 209(H) 183(H) 259(H)  BUN 8 - 23 mg/dL 18 16 17   Creatinine 0.61 - 1.24 mg/dL 0.74 0.75 0.83  Sodium 135 - 145 mmol/L 139 141 139  Potassium 3.5 - 5.1 mmol/L 3.5 3.6 3.7  Chloride 98 - 111 mmol/L 104 106 106  CO2 22 - 32 mmol/L 29 27 26   Calcium 8.9 - 10.3 mg/dL 9.0 8.9 8.8(L)  Total Protein 6.5 - 8.1 g/dL 6.1(L) 6.2(L) 6.1(L)  Total Bilirubin 0.3 - 1.2 mg/dL 0.7 0.6 0.5    Alkaline Phos 38 - 126 U/L 51 50 50  AST 15 - 41 U/L 16 22 25   ALT 0 - 44 U/L 20 21 18      Brief H and P: For complete details please refer to admission H and P, but in brief,  Jesus [name] is a wonderful 63 y.o. male who has been admitted today for C5 EPOCH-R treatment of his T-Cell/Histocyte rich Large B-Cell lymphoma. [name].   His C5 was delayed due to E.COli sepsis from UTI. He had previously had am E.COli UTI as well and has some issues with BPH which might be a risk factor.  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  CT of the abdomen and pelvis performed on 12/22/2017 showed bulky left inguinal, left hemipelvic, and retroperitoneal adenopathy.   01/02/18 Left inguinal LN Biopsy revealed T-Cell/histocyte rich Large B-Cell Lymphoma  12/27/17 ECHO revealed LV EF of 55-60%   01/05/18 PET/CT revealedMassively enlarged pelvic lymph nodes intense metabolic activity consistent lymphoma.  2. Additional hypermetabolic lymph nodes in the porta hepatis and retroperitoneum LEFT aorta. 3. Solitary hypermetabolic mediastinal lymph node in the upper LEFT Mediastinum. 4. 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 5. Normal spleen.  HIV non reactive on 12/22/2017.Hep C and hep B serology negative.  03/14/18 PET/CT revealedPET-CT findings suggest an excellent response to chemotherapy. The abdominal lymphadenopathy has near completely resolved and demonstrates a near complete metabolic response. The pelvic and inguinal adenopathy has significantly decreased in size and the metabolic activity has significantly decreased. 2. Diffuse marrow activity likely due to chemotherapy and or  marrow stimulating drugs. I do not see any discrete persistent lesions.   04/17/18 CT Head revealedSubtle mesial caudothalamic hypodensities may be artifact though, the could reflect encephalitis or Wernicke's encephalopathy. Consider MRI of the head with and without contrast. 2. Mild chronic small vessel ischemic changes  2) left lower extremity swelling- now resolved Doppler ultrasound for DVT was negative in the left lower extremity.  Likely from venous compression +/- lymphatic obstruction from bulky left inguinal, left hemipelvic, and retroperitoneal adenopathy.   3) S/p Port a cath placement   4)h/oE.coli UTI - Pansensitive -Resolved.Also appearedto have BPH like symptomatology. -Was hospitalized 04/16/18 to 04/20/18 for E.coli sepsis from UTI, treated with antibiotics.  5)s/pE.COLi sepsis - likely from urinary source. Recent h/o E.coli UTI  6) s/p Symptomatic Anemia Hgb 6.7 - due to chemotherapy/sepsis- s/p PRBC transfusion. No overt evidence of bleeding. hgb now stable at 9.6  7) DVT prophylaxis -lovenoxSC in the hospital  8) HTN- was elevated in setting of improved po intake and steroids -improving control -conitnue on lisinopril 20 mg p.o. daily today -Continue Flomax for his BPH. -increased amlodipine to 10 mg  To optimize BP control -will monitor and optimize rx accordingly.  9) Likely BPH - on flomax  PLAN: -labs from 05/14/2018 reviewed with patient - stable. -continue Lisinopril and amlodipine adjusted to optimize blood pressure control -The pt has no prohibitive toxicities from continuingC5D5 EPOCH-Rat this time.  -outpatient Neulasta and Rituxan on 05/16/2018 -transfuse prn for hgb<8 - hgb stable at 9.7 at this time. -will discharge on prophylactic Abx - Bactrim on discharge given recurrent UTI's -f/u with urology for evaluation of his prostate and recurrent UTIs- has appointment next week. -encouraged continued good po  hydration.  Physical Exam at Discharge: BP (!) 159/97 (BP Location: Left Arm)   Pulse 67   Temp 97.7 F (36.5 C) (Oral)   Resp 16   Ht 6' (1.829 m)   Wt 261 lb 1.6 oz (118.4 kg)   SpO2 100%   BMI 35.41 kg/m  . GENERAL:alert, in no acute distress and comfortable SKIN: no acute rashes, no significant lesions EYES: conjunctiva are pink and non-injected, sclera anicteric OROPHARYNX: MMM, no exudates, no oropharyngeal erythema or ulceration NECK: supple, no JVD LYMPH:  no palpable lymphadenopathy in the cervical, axillary or inguinal regions LUNGS: clear to auscultation b/l with normal respiratory effort HEART: regular rate & rhythm ABDOMEN:  normoactive bowel sounds , non tender, not distended. Extremity: no pedal edema PSYCH: alert & oriented x 3 with fluent speech NEURO: no focal motor/sensory deficits     Hospital Course:  Active Problems:   Large cell (diffuse) non-Hodgkin's lymphoma (HCC)   Diet:  Low salt consistent carb  Activity:  Increase as tolerated  Condition at Discharge:   Stable  Signed: Dr. Sullivan Lone MD [place] [phone]  05/14/2018, 11:21 AM   TT spent discharging patient >54mns

## 2018-05-15 ENCOUNTER — Other Ambulatory Visit: Payer: Self-pay | Admitting: Hematology

## 2018-05-15 NOTE — Progress Notes (Signed)
HEMATOLOGY/ONCOLOGY INPATIENT PROGRESS NOTE  Date of Service: 05/13/2018  Inpatient Attending: .No att. providers found   SUBJECTIVE:   Jesus [name] was seen in follow-up today.  No notes no acute new concerns. Tolerated EPOCH-R C5D4 without any issues. BP improving with medication changes.  OBJECTIVE:  NAD  PHYSICAL EXAMINATION: . Vitals:   05/13/18 0649 05/13/18 1430 05/13/18 2244   BP: (!) 163/89 (!) 158/87 (!) 154/88   Pulse: (!) 52 71 66   Resp: 20 16 20    Temp: 97.6 F (36.4 C) 97.6 F (36.4 C) 97.8 F (36.6 C)   TempSrc: Oral Oral Oral   SpO2: 100% 100% 100%   Weight:      Height:       Filed Weights   05/10/18 1157  Weight: 261 lb 1.6 oz (118.4 kg)   .Body mass index is 35.41 kg/m.  Marland Kitchen GENERAL:alert, in no acute distress and comfortable SKIN: no acute rashes, no significant lesions EYES: conjunctiva are pink and non-injected, sclera anicteric OROPHARYNX: MMM, no exudates, no oropharyngeal erythema or ulceration NECK: supple, no JVD LYMPH:  no palpable lymphadenopathy in the cervical, axillary or inguinal regions LUNGS: clear to auscultation b/l with normal respiratory effort HEART: regular rate & rhythm ABDOMEN:  normoactive bowel sounds , non tender, not distended. Extremity: no pedal edema PSYCH: alert & oriented x 3 with fluent speech NEURO: no focal motor/sensory deficits    MEDICAL HISTORY:  Past Medical History:  Diagnosis Date  . Allergy   . History of kidney stones   . Hyperlipidemia   . Hypertension   . Lymphadenopathy   . Pain, lower leg    Bilateral  . Peripheral arterial disease ([place])   . Pre-diabetes   . Red-green color blindness   . Snores   . Wears glasses     SURGICAL HISTORY: Past Surgical History:  Procedure Laterality Date  . CATARACT EXTRACTION W/ INTRAOCULAR LENS  IMPLANT, BILATERAL    . COLONOSCOPY    . dislodged salava stone    . FRACTURE SURGERY    . HAND ARTHROPLASTY  1995   crushed left hand  .  INGUINAL LYMPH NODE BIOPSY Left 01/02/2018   Procedure: LEFT INGUINAL LYMPH NODE BIOPSY;  Surgeon: Rolm Bookbinder, MD;  Location: [facility];  Service: General;  Laterality: Left;  . IR IMAGING GUIDED PORT INSERTION  01/15/2018  . MICROLARYNGOSCOPY Left 01/17/2014   Procedure: MICROLARYNGOSCOPY WITH EXCISION OF THE BIOPSY OF LEFT VOCAL CORD LESION;  Surgeon: Izora Gala, MD;  Location: [facility];  Service: ENT;  Laterality: Left;  . ORIF FOOT FRACTURE  2005   left    SOCIAL HISTORY: Social History   Socioeconomic History  . Marital status: Divorced    Spouse name: Not on file  . Number of children: 3  . Years of education: Not on file  . Highest education level: Not on file  Occupational History  . Not on file  Social Needs  . Financial resource strain: Not on file  . Food insecurity:    Worry: Not on file    Inability: Not on file  . Transportation needs:    Medical: Not on file    Non-medical: Not on file  Tobacco Use  . Smoking status: Current Every Day Smoker    Packs/day: 0.50    Years: 36.00    Pack years: 18.00    Types: Cigarettes  . Smokeless tobacco: Never Used  Substance and Sexual Activity  . Alcohol use:  Yes    Alcohol/week: 15.0 standard drinks    Types: 10 Cans of beer, 5 Shots of liquor per week    Comment: weekends  . Drug use: Yes    Types: Cocaine    Comment: reports cocaine usage ~2X/ month; last use 12/26/17  . Sexual activity: Not on file  Lifestyle  . Physical activity:    Days per week: Not on file    Minutes per session: Not on file  . Stress: Not on file  Relationships  . Social connections:    Talks on phone: Not on file    Gets together: Not on file    Attends religious service: Not on file    Active member of club or organization: Not on file    Attends meetings of clubs or organizations: Not on file    Relationship status: Not on file  . Intimate partner violence:    Fear of current or ex partner: Not on file     Emotionally abused: Not on file    Physically abused: Not on file    Forced sexual activity: Not on file  Other Topics Concern  . Not on file  Social History Narrative  . Not on file    FAMILY HISTORY: Family History  Problem Relation Age of Onset  . Breast cancer Mother   . Diabetes Father   . Hypertension Father   . Stroke Father   . Mental illness Sister   . Hypertension Daughter   . Mental illness Daughter   . Hypertension Brother   . Colon cancer Brother   . Breast cancer Sister     ALLERGIES:  is allergic to bee venom.  MEDICATIONS:  Scheduled Meds:  Continuous Infusions:  PRN Meds:.  REVIEW OF SYSTEMS:    10 Point review of Systems was done is negative except as noted above.   LABORATORY DATA:  I have reviewed the data as listed  . CBC Latest Ref Rng & Units 05/13/2018 05/12/2018  WBC 4.0 - 10.5 K/uL 7.1 8.6  Hemoglobin 13.0 - 17.0 g/dL 9.7(L) 9.3(L)  Hematocrit 39.0 - 52.0 % 31.5(L) 30.1(L)  Platelets 150 - 400 K/uL 238 238    . CMP Latest Ref Rng & Units 05/13/2018 05/12/2018  Glucose 70 - 99 mg/dL 183(H) 259(H)  BUN 8 - 23 mg/dL 16 17  Creatinine 0.61 - 1.24 mg/dL 0.75 0.83  Sodium 135 - 145 mmol/L 141 139  Potassium 3.5 - 5.1 mmol/L 3.6 3.7  Chloride 98 - 111 mmol/L 106 106  CO2 22 - 32 mmol/L 27 26  Calcium 8.9 - 10.3 mg/dL 8.9 8.8(L)  Total Protein 6.5 - 8.1 g/dL 6.2(L) 6.1(L)  Total Bilirubin 0.3 - 1.2 mg/dL 0.6 0.5  Alkaline Phos 38 - 126 U/L 50 50  AST 15 - 41 U/L 22 25  ALT 0 - 44 U/L 21 18     RADIOGRAPHIC STUDIES: I have personally reviewed the radiological images as listed and agreed with the findings in the report. Ct Head Wo Contrast  Result Date: 04/17/2018 CLINICAL DATA:  Altered mental status, fever. History of lymphoma, hypertension and hyperlipidemia. EXAM: CT HEAD WITHOUT CONTRAST TECHNIQUE: Contiguous axial images were obtained from the base of the skull through the vertex without intravenous contrast. COMPARISON:  None.  FINDINGS: BRAIN: No intraparenchymal hemorrhage, mass effect nor midline shift. Subtle bilateral mesial caudothalamic hypodensities. The ventricles and sulci are normal for age. Patchy supratentorial white matter hypodensities. No acute large vascular territory infarcts. No abnormal  extra-axial fluid collections. Basal cisterns are patent. VASCULAR: Unremarkable. SKULL: No skull fracture. No significant scalp soft tissue swelling. SINUSES/ORBITS: Trace paranasal sinus mucosal thickening. Mastoid air cells are well aerated.The included ocular globes and orbital contents are non-suspicious. Status post bilateral ocular lens implants. OTHER: None. IMPRESSION: 1. Subtle mesial caudothalamic hypodensities may be artifact though, the could reflect encephalitis or Wernicke's encephalopathy. Consider MRI of the head with and without contrast. 2. Mild chronic small vessel ischemic changes. Electronically Signed   By: Elon Alas M.D.   On: 04/17/2018 17:29   US Renal  Result Date: 04/20/2018 CLINICAL DATA:  Acute kidney injury EXAM: RENAL / URINARY TRACT ULTRASOUND COMPLETE COMPARISON:  Sub-12/22/2017 abdominal CT. FINDINGS: Right Kidney: Renal measurements: 13 x 7 x 8 cm = volume: 420 mL. Scattered simple cysts measuring up to 3.3 cm at the lower pole. No hydronephrosis. Normal cortical echogenicity. Left Kidney: Renal measurements: 13 x 7 x 7 cm = volume: 340 mL. Scattered cortical cysts measuring up to 4.9 cm lower pole. Also at the lower pole is a cyst that is complicated by internal echogenic material that is nonshadowing. Despite this internal complexity this cyst had a benign appearance by CT. Given patient's intra-abdominal malignancy there will be continued surveillance of the kidneys by CT. Bladder: Bladder wall thickening that is generalized and nonspecific. No internal debris or discrete mass. IMPRESSION: 1. No hydronephrosis. 2. Mild nonspecific bladder wall thickening. 3. Renal cysts with complicated  but benign appearing left lower pole cyst measuring 5.1 cm. Electronically Signed   By: Monte Fantasia M.D.   On: 04/20/2018 09:24   Dg Chest Port 1 View  Result Date: 04/16/2018 CLINICAL DATA:  Shortness of breath, hypertension EXAM: PORTABLE CHEST 1 VIEW COMPARISON:  Portable exam 1620 hours compared to 04/04/2006 FINDINGS: RIGHT jugular Port-A-Cath with tip projecting over SVC near cavoatrial junction. Upper normal size of cardiac silhouette with slight vascular congestion. Mediastinal contours normal. Lungs clear. No infiltrate, pleural effusion or pneumothorax. IMPRESSION: No definite acute abnormalities. Electronically Signed   By: Lavonia Dana M.D.   On: 04/16/2018 17:01    ASSESSMENT & PLAN:   63 y.o. 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  CT of the abdomen and pelvis performed on 12/22/2017 showed bulky left inguinal, left hemipelvic, and retroperitoneal adenopathy.   01/02/18 Left inguinal LN Biopsy revealed T-Cell/histocyte rich Large B-Cell Lymphoma  12/27/17 ECHO revealed LV EF of 55-60%   01/05/18 PET/CT revealedMassively enlarged pelvic lymph nodes intense metabolic activity consistent lymphoma. 2. Additional hypermetabolic lymph nodes in the porta hepatis and retroperitoneum LEFT aorta. 3. Solitary hypermetabolic mediastinal lymph node in the upper LEFT Mediastinum. 4. 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 5. Normal spleen.  HIV non reactive on 12/22/2017.Hep C and hep B serology negative.  03/14/18 PET/CT revealedPET-CT findings suggest an excellent response to chemotherapy. The abdominal lymphadenopathy has near completely resolved and demonstrates a near complete metabolic response. The pelvic and inguinal  adenopathy has significantly decreased in size and the metabolic activity has significantly decreased. 2. Diffuse marrow activity likely due to chemotherapy and or marrow stimulating drugs. I do not see any discrete persistent lesions.   04/17/18 CT Head revealedSubtle mesial caudothalamic hypodensities may be artifact though, the could reflect encephalitis or Wernicke's encephalopathy. Consider MRI of the head with and without contrast. 2. Mild chronic small vessel  ischemic changes   2) left lower extremity swelling- now resolved Doppler ultrasound for DVT was negative in the left lower extremity.  Likely from venous compression +/- lymphatic obstruction from bulky left inguinal, left hemipelvic, and retroperitoneal adenopathy.   3) S/p Port a cath placement   4)h/oE.coli UTI - Pansensitive -Resolved.Also appearedto have BPH like symptomatology. -Was hospitalized 04/16/18 to 04/20/18 for E.coli sepsis from UTI, treated with antibiotics.   5) s/pE.COLi sepsis - likely from urinary source. Recent h/o E.coli UTI  6) s/p Symptomatic Anemia Hgb 6.7 - due to chemotherapy/sepsis-  s/p PRBC transfusion. No overt evidence of bleeding. hgb now stable at 9.6  7) DVT prophylaxis -lovenox  8) HTN- was elevated in setting of improved po intake and steroids -improving control -conitnue on lisinopril 20 mg p.o. daily today -Continue Flomax for his BPH. -conitnue amlodipine at 5 mg p.o. daily plan to increase to 10 mg if blood pressure still elevated. -will monitor and optimize rx accordingly.  9) Likely BPH - on flomax  PLAN: -labs from 05/13/2018 reviewed with patient - stable. -Mild hyperglycemia in the 200s likely due to steroids and his prediabetes.  Recommended avoiding excessive sugary juices or foods. -Lower salt intake. -continue Lisinopril and amlodipine adjusted to optimize blood pressure control -The pt has no prohibitive toxicities from continuing C5D4 EPOCH-R at this  time.   -outpatient Neulasta and Rituxan on 05/16/2018 -transfuse prn for hgb<8 -will consider prophylactic Abx - Bactrim on discharge given recurrent UTI's  -f/u with urology for evaluation of his prostate and recurrent UTIs - has appointment next week. -encouraged continued good po hydration.    Sullivan Lone MD [place] AAHIVMS [facility] Hematology/Oncology Physician [facility]  (Office):       [phone] (Work cell):  [phone] (Fax):           [phone]

## 2018-05-16 ENCOUNTER — Inpatient Hospital Stay: Payer: 59 | Attending: Oncology

## 2018-05-16 VITALS — BP 142/75 | HR 83 | Temp 97.8°F | Resp 16

## 2018-05-16 DIAGNOSIS — Z86718 Personal history of other venous thrombosis and embolism: Secondary | ICD-10-CM | POA: Insufficient documentation

## 2018-05-16 DIAGNOSIS — Z791 Long term (current) use of non-steroidal anti-inflammatories (NSAID): Secondary | ICD-10-CM | POA: Insufficient documentation

## 2018-05-16 DIAGNOSIS — N401 Enlarged prostate with lower urinary tract symptoms: Secondary | ICD-10-CM | POA: Insufficient documentation

## 2018-05-16 DIAGNOSIS — R600 Localized edema: Secondary | ICD-10-CM | POA: Insufficient documentation

## 2018-05-16 DIAGNOSIS — F17210 Nicotine dependence, cigarettes, uncomplicated: Secondary | ICD-10-CM | POA: Diagnosis not present

## 2018-05-16 DIAGNOSIS — I10 Essential (primary) hypertension: Secondary | ICD-10-CM | POA: Diagnosis not present

## 2018-05-16 DIAGNOSIS — Z7982 Long term (current) use of aspirin: Secondary | ICD-10-CM | POA: Diagnosis not present

## 2018-05-16 DIAGNOSIS — R61 Generalized hyperhidrosis: Secondary | ICD-10-CM | POA: Diagnosis not present

## 2018-05-16 DIAGNOSIS — C8338 Diffuse large B-cell lymphoma, lymph nodes of multiple sites: Secondary | ICD-10-CM | POA: Insufficient documentation

## 2018-05-16 DIAGNOSIS — E785 Hyperlipidemia, unspecified: Secondary | ICD-10-CM | POA: Insufficient documentation

## 2018-05-16 DIAGNOSIS — I739 Peripheral vascular disease, unspecified: Secondary | ICD-10-CM | POA: Diagnosis not present

## 2018-05-16 DIAGNOSIS — Z7689 Persons encountering health services in other specified circumstances: Secondary | ICD-10-CM | POA: Insufficient documentation

## 2018-05-16 DIAGNOSIS — Z79899 Other long term (current) drug therapy: Secondary | ICD-10-CM | POA: Insufficient documentation

## 2018-05-16 DIAGNOSIS — Z7189 Other specified counseling: Secondary | ICD-10-CM

## 2018-05-16 DIAGNOSIS — Z5112 Encounter for antineoplastic immunotherapy: Secondary | ICD-10-CM | POA: Insufficient documentation

## 2018-05-16 MED ORDER — SODIUM CHLORIDE 0.9 % IV SOLN
Freq: Once | INTRAVENOUS | Status: AC
  Administered 2018-05-16: 12:00:00 via INTRAVENOUS
  Filled 2018-05-16: qty 250

## 2018-05-16 MED ORDER — SODIUM CHLORIDE 0.9% FLUSH
10.0000 mL | INTRAVENOUS | Status: DC | PRN
  Administered 2018-05-16: 10 mL
  Filled 2018-05-16: qty 10

## 2018-05-16 MED ORDER — SODIUM CHLORIDE 0.9 % IV SOLN
375.0000 mg/m2 | Freq: Once | INTRAVENOUS | Status: AC
  Administered 2018-05-16: 1000 mg via INTRAVENOUS
  Filled 2018-05-16: qty 100

## 2018-05-16 MED ORDER — HEPARIN SOD (PORK) LOCK FLUSH 100 UNIT/ML IV SOLN
500.0000 [IU] | Freq: Once | INTRAVENOUS | Status: AC | PRN
  Administered 2018-05-16: 500 [IU]
  Filled 2018-05-16: qty 5

## 2018-05-16 MED ORDER — METHYLPREDNISOLONE SODIUM SUCC 125 MG IJ SOLR
80.0000 mg | Freq: Every day | INTRAMUSCULAR | Status: DC
  Administered 2018-05-16: 80 mg via INTRAVENOUS

## 2018-05-16 MED ORDER — ACETAMINOPHEN 325 MG PO TABS
650.0000 mg | ORAL_TABLET | Freq: Once | ORAL | Status: AC
  Administered 2018-05-16: 650 mg via ORAL

## 2018-05-16 MED ORDER — DIPHENHYDRAMINE HCL 25 MG PO CAPS
ORAL_CAPSULE | ORAL | Status: AC
  Filled 2018-05-16: qty 2

## 2018-05-16 MED ORDER — PEGFILGRASTIM-CBQV 6 MG/0.6ML ~~LOC~~ SOSY
PREFILLED_SYRINGE | SUBCUTANEOUS | Status: AC
  Filled 2018-05-16: qty 0.6

## 2018-05-16 MED ORDER — ACETAMINOPHEN 325 MG PO TABS
ORAL_TABLET | ORAL | Status: AC
  Filled 2018-05-16: qty 2

## 2018-05-16 MED ORDER — METHYLPREDNISOLONE SODIUM SUCC 125 MG IJ SOLR
INTRAMUSCULAR | Status: AC
  Filled 2018-05-16: qty 2

## 2018-05-16 MED ORDER — PEGFILGRASTIM-CBQV 6 MG/0.6ML ~~LOC~~ SOSY
6.0000 mg | PREFILLED_SYRINGE | Freq: Once | SUBCUTANEOUS | Status: AC
  Administered 2018-05-16: 6 mg via SUBCUTANEOUS

## 2018-05-16 MED ORDER — DIPHENHYDRAMINE HCL 25 MG PO CAPS
50.0000 mg | ORAL_CAPSULE | Freq: Once | ORAL | Status: AC
  Administered 2018-05-16: 50 mg via ORAL

## 2018-05-16 NOTE — Progress Notes (Signed)
Per MD Irene Limbo ok to use labs and MD visit from 05/14/18 for today's treatment.

## 2018-05-16 NOTE — Patient Instructions (Signed)
[facility] Discharge Instructions for Patients Receiving Chemotherapy  Today you received the following chemotherapy agents:  Rituxan  To help prevent nausea and vomiting after your treatment, we encourage you to take your nausea medication as prescribed.   If you develop nausea and vomiting that is not controlled by your nausea medication, call the clinic.   BELOW ARE SYMPTOMS THAT SHOULD BE REPORTED IMMEDIATELY:  *FEVER GREATER THAN 100.5 F  *CHILLS WITH OR WITHOUT FEVER  NAUSEA AND VOMITING THAT IS NOT CONTROLLED WITH YOUR NAUSEA MEDICATION  *UNUSUAL SHORTNESS OF BREATH  *UNUSUAL BRUISING OR BLEEDING  TENDERNESS IN MOUTH AND THROAT WITH OR WITHOUT PRESENCE OF ULCERS  *URINARY PROBLEMS  *BOWEL PROBLEMS  UNUSUAL RASH Items with * indicate a potential emergency and should be followed up as soon as possible.  Feel free to call the clinic should you have any questions or concerns. The clinic phone number is [phone].  Please show the [facility] at check-in to the Emergency Department and triage nurse.   Pegfilgrastim injection What is this medicine? PEGFILGRASTIM (PEG fil gra stim) is a long-acting granulocyte colony-stimulating factor that stimulates the growth of neutrophils, a type of white blood cell important in the body's fight against infection. It is used to reduce the incidence of fever and infection in patients with certain types of cancer who are receiving chemotherapy that affects the bone marrow, and to increase survival after being exposed to high doses of radiation. This medicine may be used for other purposes; ask your health care provider or pharmacist if you have questions. COMMON BRAND NAME(S): Neulasta What should I tell my health care provider before I take this medicine? They need to know if you have any of these conditions: -kidney disease -latex allergy -ongoing radiation therapy -sickle cell disease -skin reactions to  acrylic adhesives (On-Body Injector only) -an unusual or allergic reaction to pegfilgrastim, filgrastim, other medicines, foods, dyes, or preservatives -pregnant or trying to get pregnant -breast-feeding How should I use this medicine? This medicine is for injection under the skin. If you get this medicine at home, you will be taught how to prepare and give the pre-filled syringe or how to use the On-body Injector. Refer to the patient Instructions for Use for detailed instructions. Use exactly as directed. Tell your healthcare provider immediately if you suspect that the On-body Injector may not have performed as intended or if you suspect the use of the On-body Injector resulted in a missed or partial dose. It is important that you put your used needles and syringes in a special sharps container. Do not put them in a trash can. If you do not have a sharps container, call your pharmacist or healthcare provider to get one. Talk to your pediatrician regarding the use of this medicine in children. While this drug may be prescribed for selected conditions, precautions do apply. Overdosage: If you think you have taken too much of this medicine contact a poison control center or emergency room at once. NOTE: This medicine is only for you. Do not share this medicine with others. What if I miss a dose? It is important not to miss your dose. Call your doctor or health care professional if you miss your dose. If you miss a dose due to an On-body Injector failure or leakage, a new dose should be administered as soon as possible using a single prefilled syringe for manual use. What may interact with this medicine? Interactions have not been studied. Give  your health care provider a list of all the medicines, herbs, non-prescription drugs, or dietary supplements you use. Also tell them if you smoke, drink alcohol, or use illegal drugs. Some items may interact with your medicine. This list may not describe all  possible interactions. Give your health care provider a list of all the medicines, herbs, non-prescription drugs, or dietary supplements you use. Also tell them if you smoke, drink alcohol, or use illegal drugs. Some items may interact with your medicine. What should I watch for while using this medicine? You may need blood work done while you are taking this medicine. If you are going to need a MRI, CT scan, or other procedure, tell your doctor that you are using this medicine (On-Body Injector only). What side effects may I notice from receiving this medicine? Side effects that you should report to your doctor or health care professional as soon as possible: -allergic reactions like skin rash, itching or hives, swelling of the face, lips, or tongue -dizziness -fever -pain, redness, or irritation at site where injected -pinpoint red spots on the skin -red or dark-brown urine -shortness of breath or breathing problems -stomach or side pain, or pain at the shoulder -swelling -tiredness -trouble passing urine or change in the amount of urine Side effects that usually do not require medical attention (report to your doctor or health care professional if they continue or are bothersome): -bone pain -muscle pain This list may not describe all possible side effects. Call your doctor for medical advice about side effects. You may report side effects to FDA at 1-800-FDA-1088. Where should I keep my medicine? Keep out of the reach of children. Store pre-filled syringes in a refrigerator between 2 and 8 degrees C (36 and 46 degrees F). Do not freeze. Keep in carton to protect from light. Throw away this medicine if it is left out of the refrigerator for more than 48 hours. Throw away any unused medicine after the expiration date. NOTE: This sheet is a summary. It may not cover all possible information. If you have questions about this medicine, talk to your doctor, pharmacist, or health care  provider.  2018 Elsevier/Gold Standard (2016-04-21 12:58:03)

## 2018-05-18 ENCOUNTER — Telehealth: Payer: Self-pay | Admitting: Hematology

## 2018-05-18 NOTE — Telephone Encounter (Signed)
R/s appt per 1/10 sch message - pt is aware of appt date and time

## 2018-05-22 ENCOUNTER — Other Ambulatory Visit: Payer: Self-pay

## 2018-05-22 ENCOUNTER — Emergency Department (HOSPITAL_COMMUNITY): Payer: 59

## 2018-05-22 ENCOUNTER — Telehealth: Payer: Self-pay | Admitting: *Deleted

## 2018-05-22 ENCOUNTER — Encounter (HOSPITAL_COMMUNITY): Payer: Self-pay

## 2018-05-22 ENCOUNTER — Emergency Department (HOSPITAL_COMMUNITY)
Admission: EM | Admit: 2018-05-22 | Discharge: 2018-05-22 | Disposition: A | Discharge status: Home / Self Care | Payer: 59 | Attending: Emergency Medicine | Admitting: Emergency Medicine

## 2018-05-22 DIAGNOSIS — R079 Chest pain, unspecified: Secondary | ICD-10-CM | POA: Diagnosis present

## 2018-05-22 DIAGNOSIS — C8590 Non-Hodgkin lymphoma, unspecified, unspecified site: Secondary | ICD-10-CM | POA: Insufficient documentation

## 2018-05-22 DIAGNOSIS — Z9221 Personal history of antineoplastic chemotherapy: Secondary | ICD-10-CM | POA: Insufficient documentation

## 2018-05-22 DIAGNOSIS — I10 Essential (primary) hypertension: Secondary | ICD-10-CM | POA: Diagnosis not present

## 2018-05-22 DIAGNOSIS — Z79899 Other long term (current) drug therapy: Secondary | ICD-10-CM | POA: Diagnosis not present

## 2018-05-22 DIAGNOSIS — Z7982 Long term (current) use of aspirin: Secondary | ICD-10-CM | POA: Diagnosis not present

## 2018-05-22 DIAGNOSIS — R0789 Other chest pain: Secondary | ICD-10-CM | POA: Insufficient documentation

## 2018-05-22 DIAGNOSIS — Z8572 Personal history of non-Hodgkin lymphomas: Secondary | ICD-10-CM | POA: Diagnosis not present

## 2018-05-22 DIAGNOSIS — R0609 Other forms of dyspnea: Secondary | ICD-10-CM | POA: Insufficient documentation

## 2018-05-22 LAB — BASIC METABOLIC PANEL
Anion gap: 11 (ref 5–15)
BUN: 16 mg/dL (ref 8–23)
CO2: 27 mmol/L (ref 22–32)
Calcium: 9.7 mg/dL (ref 8.9–10.3)
Chloride: 102 mmol/L (ref 98–111)
Creatinine, Ser: 0.84 mg/dL (ref 0.61–1.24)
GFR calc Af Amer: >60 mL/min (ref >60)
GFR calc non Af Amer: >60 mL/min (ref >60)
Glucose, Bld: 119 mg/dL — ABNORMAL HIGH (ref 70–99)
Potassium: 3.5 mmol/L (ref 3.5–5.1)
Sodium: 140 mmol/L (ref 135–145)

## 2018-05-22 LAB — CBC
HCT: 34.2 % — ABNORMAL LOW (ref 39.0–52.0)
Hemoglobin: 10.7 g/dL — ABNORMAL LOW (ref 13.0–17.0)
MCH: 29.1 pg (ref 26.0–34.0)
MCHC: 31.3 g/dL (ref 30.0–36.0)
MCV: 92.9 fL (ref 80.0–100.0)
Platelets: 103 10*3/uL — ABNORMAL LOW (ref 150–400)
RBC: 3.68 MIL/uL — ABNORMAL LOW (ref 4.22–5.81)
RDW: 18 % — ABNORMAL HIGH (ref 11.5–15.5)
WBC: 3.3 10*3/uL — ABNORMAL LOW (ref 4.0–10.5)
nRBC: 0.6 % — ABNORMAL HIGH (ref 0.0–0.2)

## 2018-05-22 LAB — TYPE AND SCREEN
ABO/RH(D): A POS
Antibody Screen: NEGATIVE

## 2018-05-22 LAB — I-STAT TROPONIN, ED: Troponin i, poc: 0.01 ng/mL (ref 0.00–0.08)

## 2018-05-22 IMAGING — CR DG CHEST 2V
2 series · 2 of 2 positions shown · non-contrast
Comparison: [DATE]

CLINICAL DATA: Low back pain, chest pain

EXAM:
CHEST - 2 VIEW

[w chest pa]
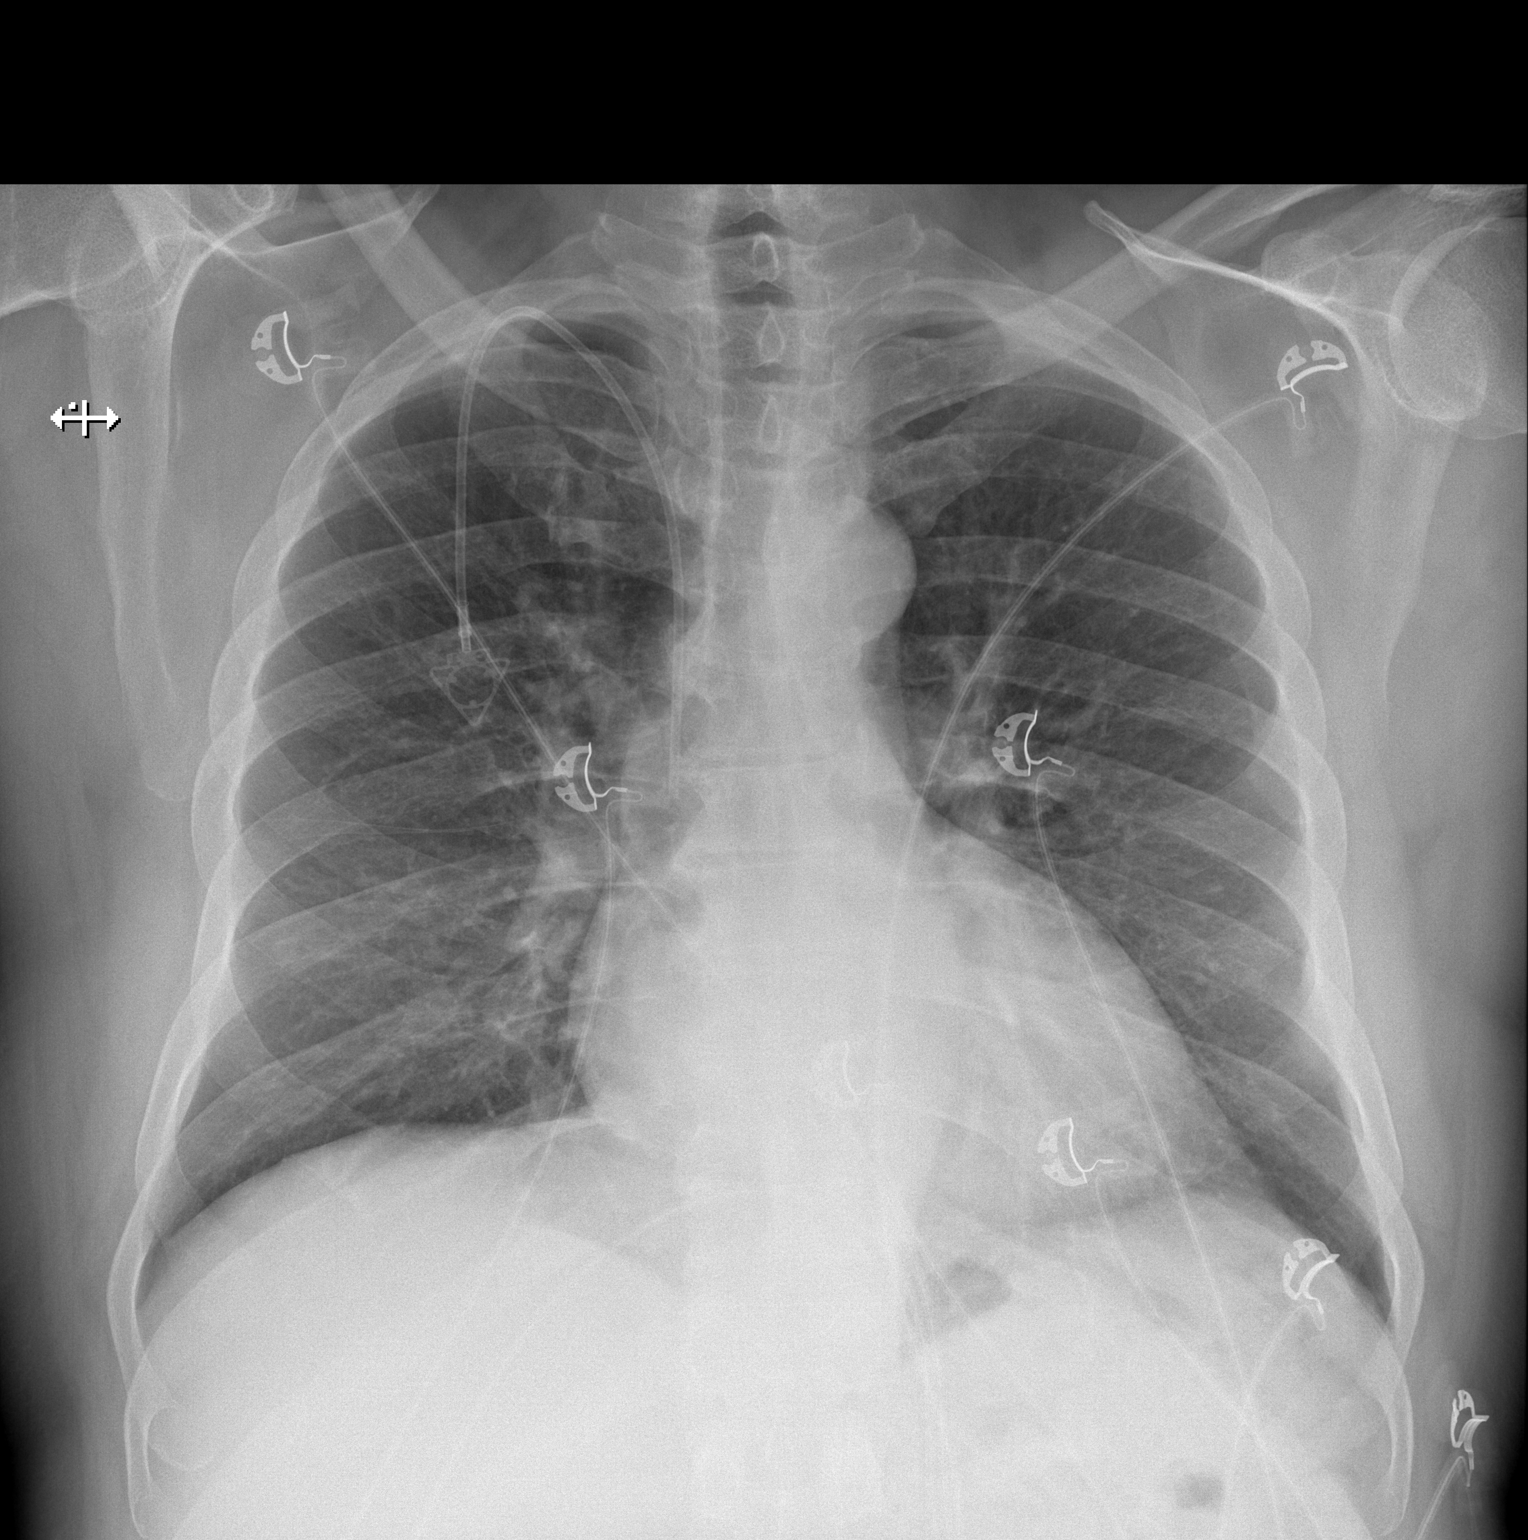

[w chest lat]
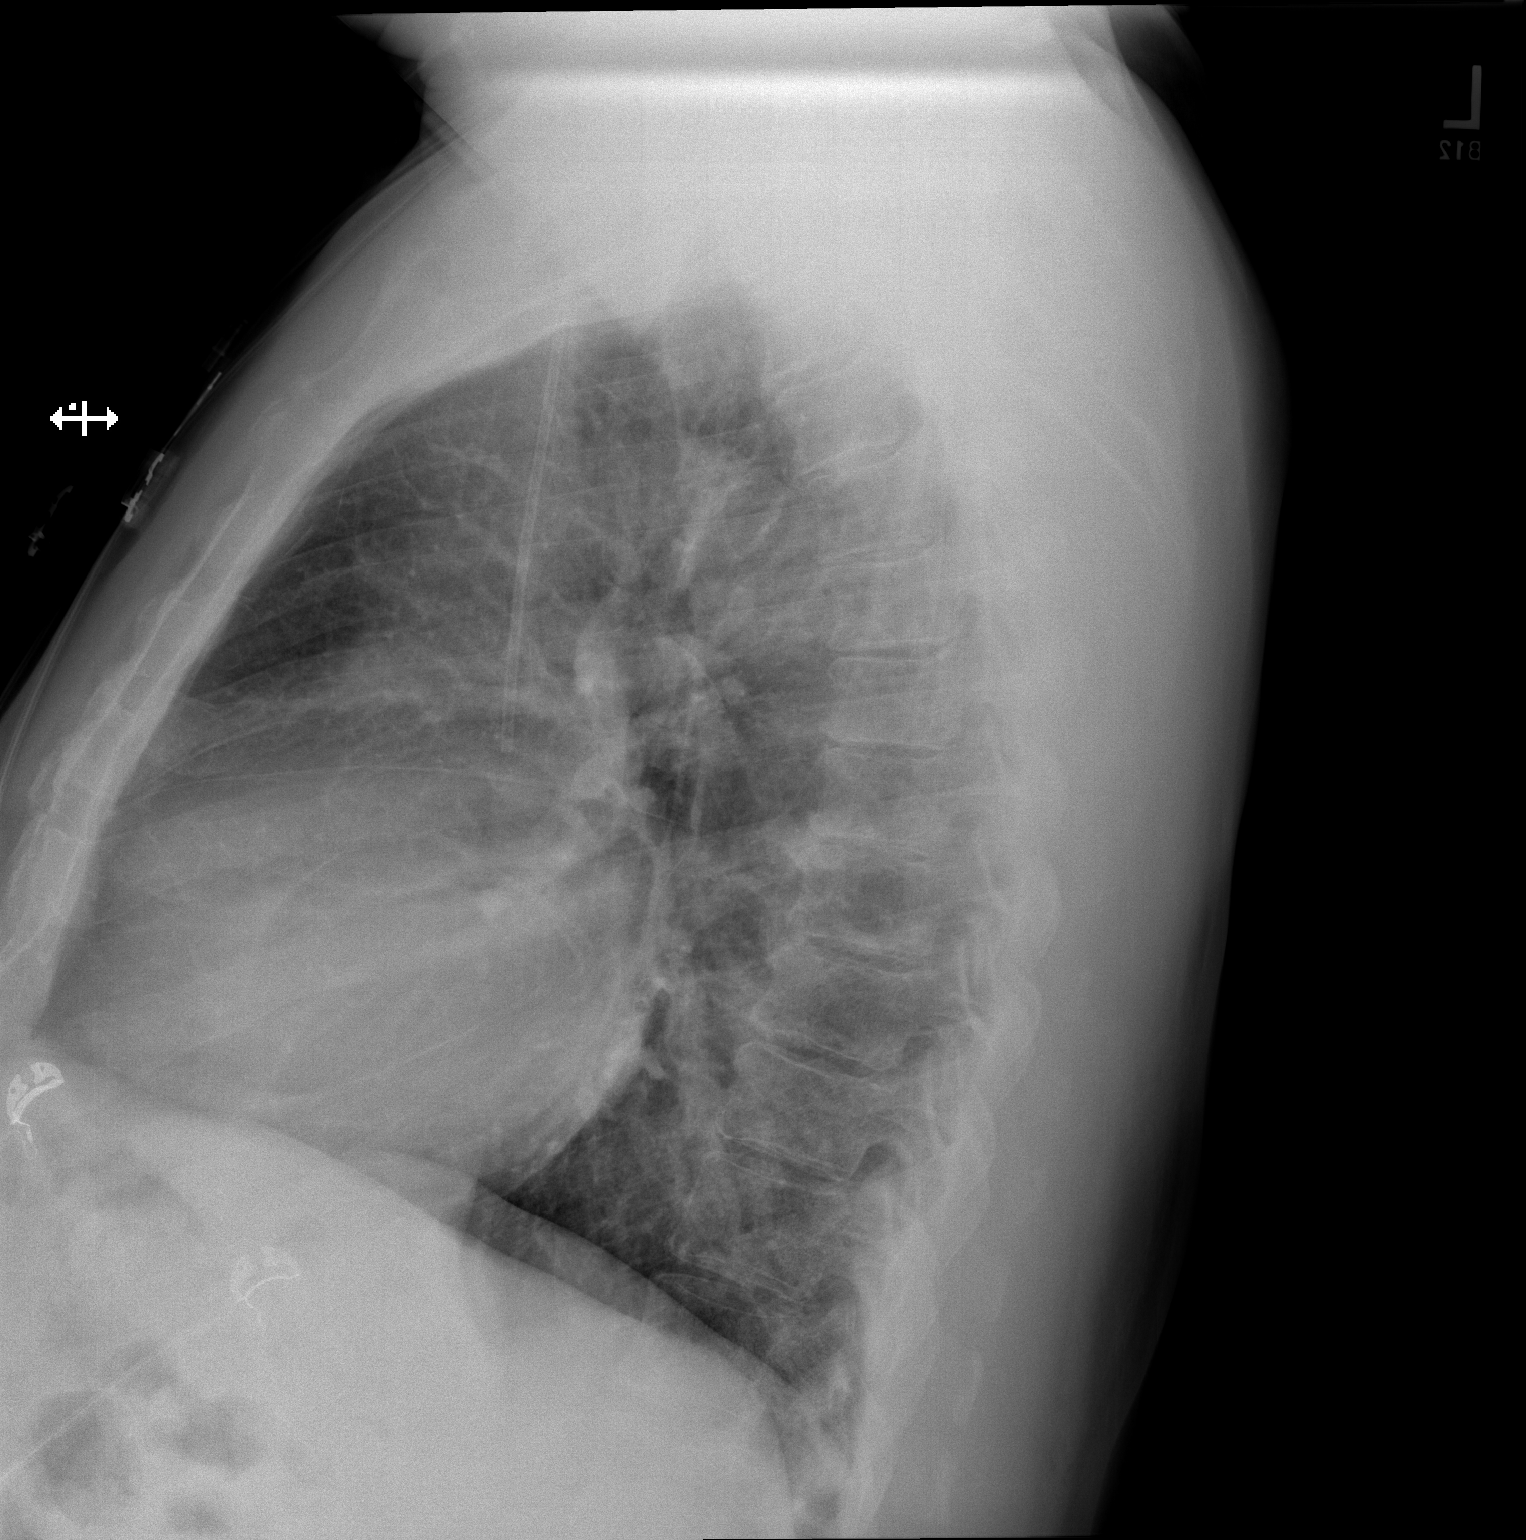

[2 of 2 positions shown; findings below may reference images not displayed]

FINDINGS: There is a right-sided Port-A-Cath with the tip projecting over the
SVC. There is no focal parenchymal opacity. There is no pleural
effusion or pneumothorax. The heart size is top-normal.

The osseous structures are unremarkable.
IMPRESSION: No active cardiopulmonary disease.

## 2018-05-22 IMAGING — CT CT ANGIO CHEST
2 of 7 series · 18 of 46 positions shown · IV contrast (ISOVUE)
Comparison: PA and lateral chest earlier today. PET CT scan
[DATE].

CLINICAL DATA: Onset chest pain last night which has since
resolved. History of lymphoma.

EXAM:
CT ANGIOGRAPHY CHEST WITH CONTRAST
TECHNIQUE: Multidetector CT imaging of the chest was performed using the
standard protocol during bolus administration of intravenous
contrast. Multiplanar CT image reconstructions and MIPs were
obtained to evaluate the vascular anatomy.
CONTRAST:  100 mL [HW] IOPAMIDOL ([HW]) INJECTION 76%

[Series 5: thins · axial · 0.79mm/px · z∈[-301,-40]mm · 15 of 297 slices shown]
[im 18/297  lung]
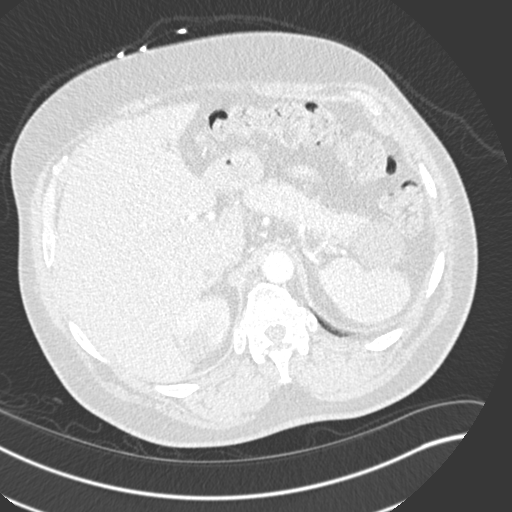
[im 35/297  soft-tissue]
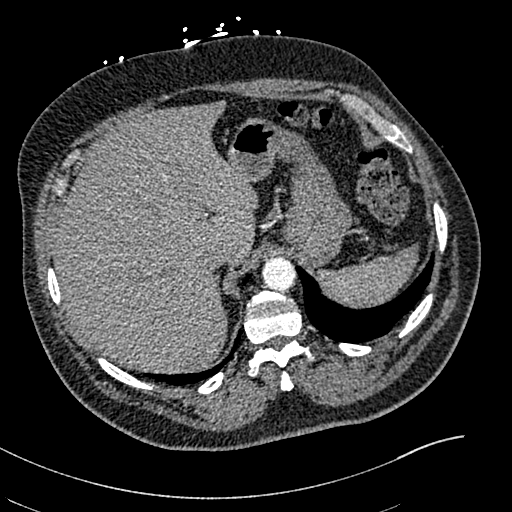
[im 53/297  lung]
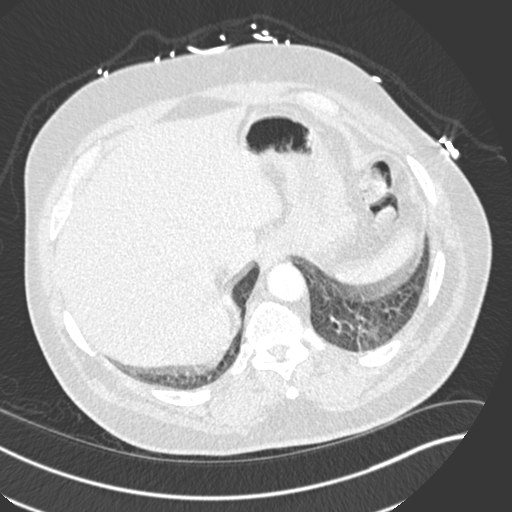
[im 70/297  soft-tissue]
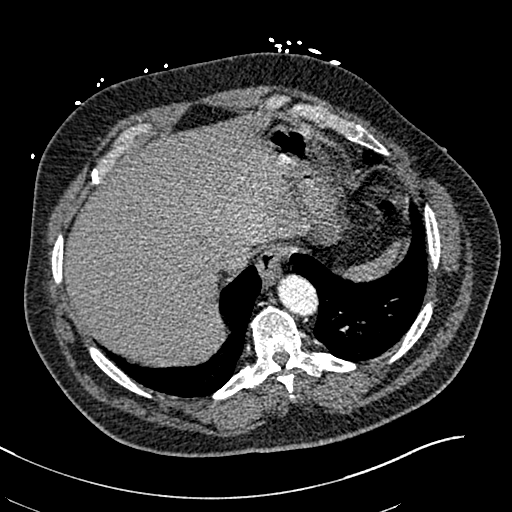
[im 88/297  lung]
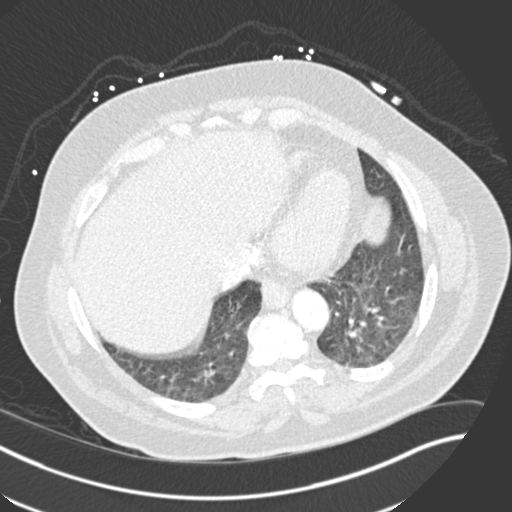
[im 105/297  soft-tissue]
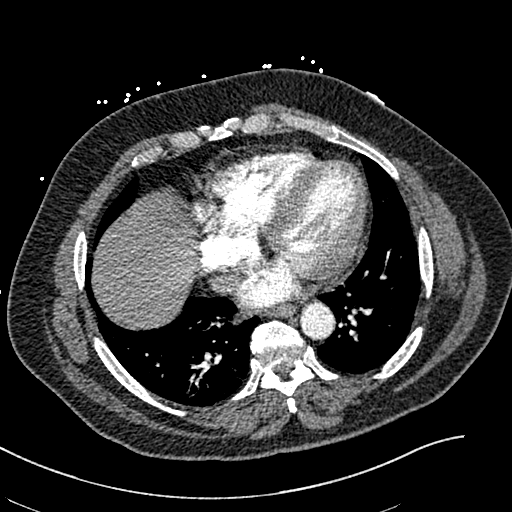
[im 122/297  lung]
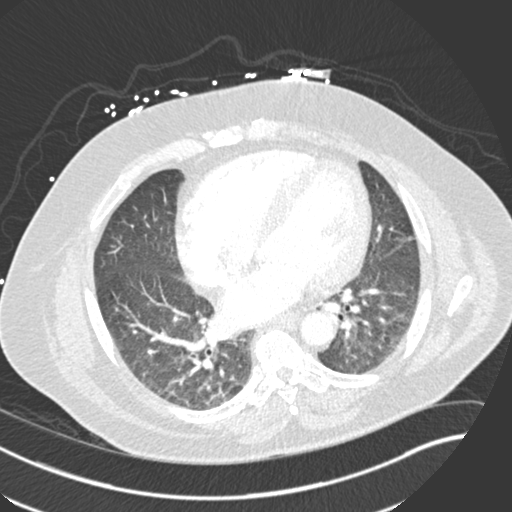
[im 157/297  soft-tissue]
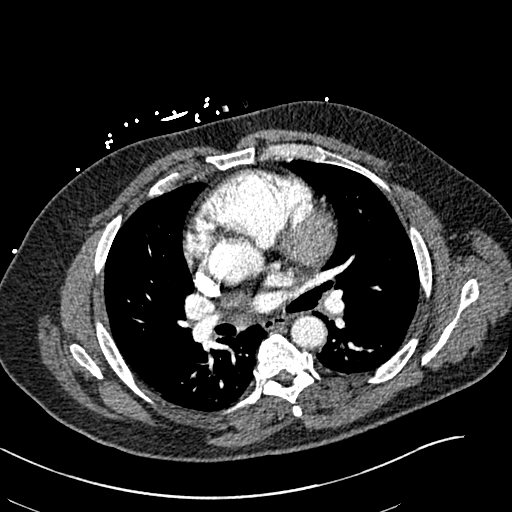
[im 175/297  lung]
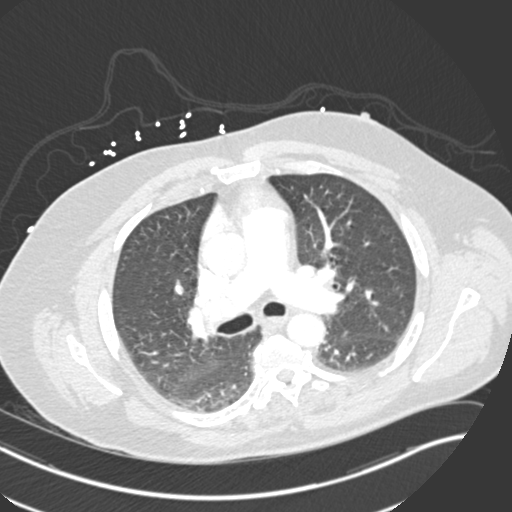
[im 192/297  soft-tissue]
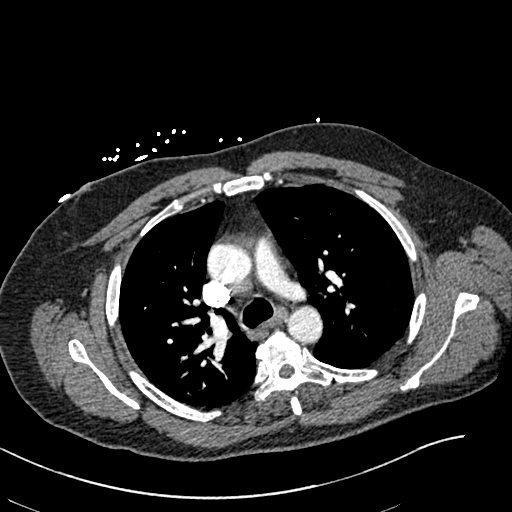
[im 209/297  lung]
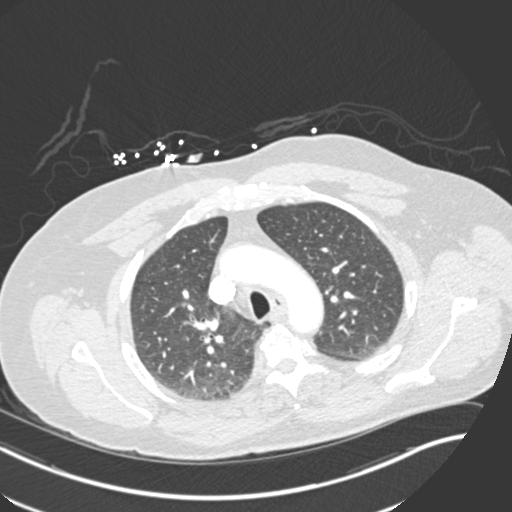
[im 227/297  soft-tissue]
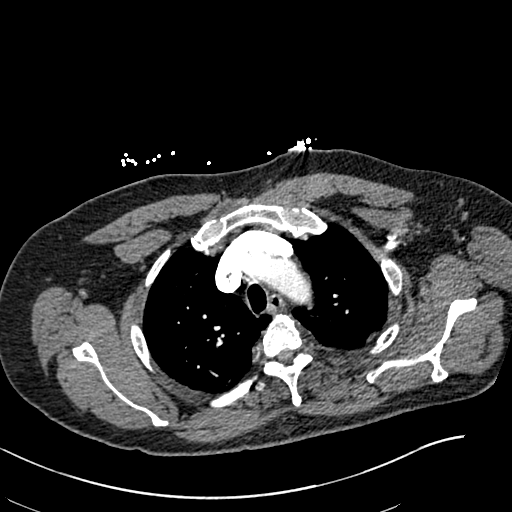
[im 244/297  lung]
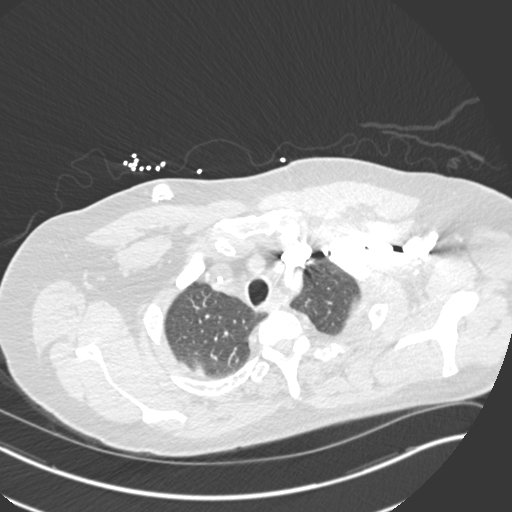
[im 262/297  soft-tissue]
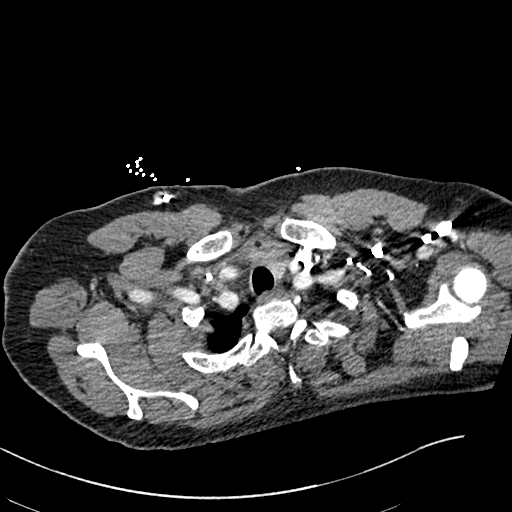
[im 279/297  lung]
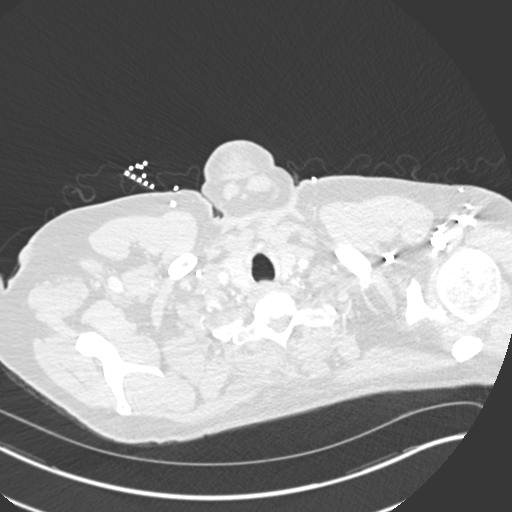

[Series 6: coronal mpr · coronal · 0.60mm/px · 3 of 184 slices shown]
[im 46/184  soft-tissue]
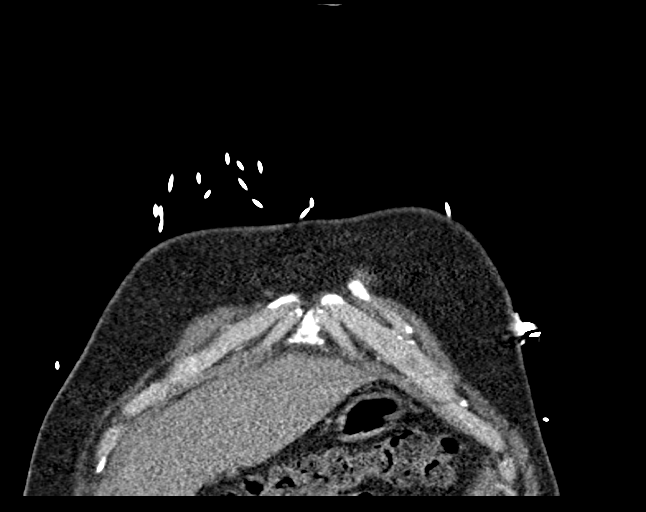
[im 92/184  soft-tissue]
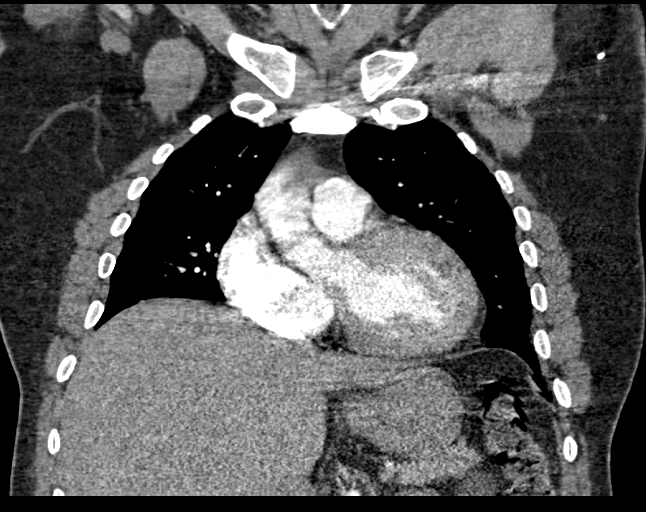
[im 138/184  soft-tissue]
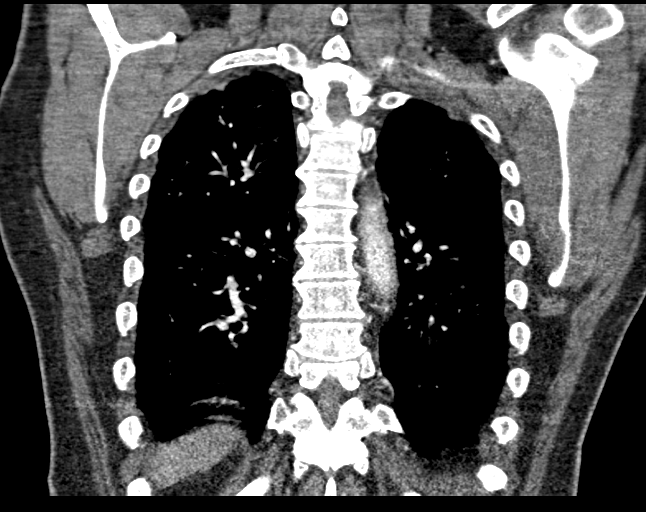

[18 of 46 positions shown; findings below may reference images not displayed]

FINDINGS: Cardiovascular: No pulmonary embolus is identified. Heart size is
upper normal. No pericardial effusion. No atherosclerosis.
Port-A-Cath noted.

Mediastinum/Nodes: No enlarged mediastinal, hilar, or axillary lymph
nodes. Thyroid gland, trachea, and esophagus demonstrate no
significant findings.

Lungs/Pleura: No pleural effusion. Lungs demonstrate only mild
dependent atelectasis.

Upper Abdomen: There is partial visualization of cysts in kidneys
which are unchanged. Imaged intra-abdominal contents are otherwise
unremarkable.

Musculoskeletal: No acute or focal abnormality.

Review of the MIP images confirms the above findings.
IMPRESSION: Negative for pulmonary embolus.  No acute disease.

## 2018-05-22 MED ORDER — IOPAMIDOL (ISOVUE-370) INJECTION 76%
100.0000 mL | Freq: Once | INTRAVENOUS | Status: AC | PRN
  Administered 2018-05-22: 100 mL via INTRAVENOUS

## 2018-05-22 MED ORDER — IOPAMIDOL (ISOVUE-370) INJECTION 76%
INTRAVENOUS | Status: AC
  Filled 2018-05-22: qty 100

## 2018-05-22 MED ORDER — IBUPROFEN 400 MG PO TABS: 400.0000 mg | ORAL_TABLET | Freq: Four times a day (QID) | ORAL | 0 refills | Status: DC | PRN

## 2018-05-22 MED ORDER — SODIUM CHLORIDE (PF) 0.9 % IJ SOLN
INTRAMUSCULAR | Status: AC
  Filled 2018-05-22: qty 50

## 2018-05-22 MED ORDER — METHOCARBAMOL 750 MG PO TABS: 750.0000 mg | ORAL_TABLET | Freq: Four times a day (QID) | ORAL | 0 refills | Status: DC

## 2018-05-22 NOTE — ED Notes (Signed)
Bed: IB70 Expected date:  Expected time:  Means of arrival:  Comments: Held for triage

## 2018-05-22 NOTE — Telephone Encounter (Signed)
Patient left voice mail - stated he felt like he did before when his blood count was low. Stated he couldn't decide whether to come to the office or go to the ED, but has now decided to go to ED and wanted Dr. Irene Limbo to know. Dr. Irene Limbo informed of patient's message.

## 2018-05-22 NOTE — ED Triage Notes (Signed)
Pt presents with c/o chest pain that started last night. Pt reports the chest pain has now resolved but that it did feel different than chest pain he has had in the past. Pt is a cancer patient, currently receiving chemo.

## 2018-05-22 NOTE — ED Provider Notes (Signed)
[facility] DEPT Provider Note   CSN: [id number] Arrival date & time: 05/22/18  1031     History   Chief Complaint Chief Complaint  Patient presents with  . Chest Pain    HPI Jesus [name] is a 63 y.o. male.  63 year old male with history of non-Hodgkin's lymphoma who is currently receiving chemotherapy presents with increasing dyspnea on exertion.  He is also had sharp reproducible chest pain is worse with coughing or movement.  Denies any fever or chills.  No vomiting or diarrhea.  Similar symptoms in the past associated with anemia.  States that he gets anemic when he has chemotherapy treatments.  Denies any GI bleeding symptoms.     Past Medical History:  Diagnosis Date  . Allergy   . History of kidney stones   . Hyperlipidemia   . Hypertension   . Lymphadenopathy   . Pain, lower leg    Bilateral  . Peripheral arterial disease ([place])   . Pre-diabetes   . Red-green color blindness   . Snores   . Wears glasses     Patient Active Problem List   Diagnosis Date Noted  . Large cell (diffuse) non-Hodgkin's lymphoma ([place]) 05/10/2018  . Bacteremia due to Escherichia coli   . HTN (hypertension) 04/17/2018  . RLS (restless legs syndrome) 04/17/2018  . Abnormal LFTs 04/17/2018  . Acute metabolic encephalopathy 40/98/1191  . AKI (acute kidney injury) ([place]) 04/16/2018  . Dehydration   . Fever, unspecified   . Sepsis ([place])   . Disorientation   . Hypokalemia   . Hypomagnesemia   . Anemia   . Encounter for antineoplastic chemotherapy   . At high risk of tumor lysis syndrome   . Swelling of lower leg   . Diffuse large B cell lymphoma ([place]) 01/15/2018  . 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[place]) 01/12/2018  . Counseling regarding advance care planning and goals of care 01/12/2018  . Bilateral leg pain 05/27/2014    Past Surgical History:  Procedure Laterality Date  . CATARACT EXTRACTION W/ INTRAOCULAR LENS   IMPLANT, BILATERAL    . COLONOSCOPY    . dislodged salava stone    . FRACTURE SURGERY    . HAND ARTHROPLASTY  1995   crushed left hand  . INGUINAL LYMPH NODE BIOPSY Left 01/02/2018   Procedure: LEFT INGUINAL LYMPH NODE BIOPSY;  Surgeon: Rolm Bookbinder, MD;  Location: [facility];  Service: General;  Laterality: Left;  . IR IMAGING GUIDED PORT INSERTION  01/15/2018  . MICROLARYNGOSCOPY Left 01/17/2014   Procedure: MICROLARYNGOSCOPY WITH EXCISION OF THE BIOPSY OF LEFT VOCAL CORD LESION;  Surgeon: Izora Gala, MD;  Location: [facility];  Service: ENT;  Laterality: Left;  . ORIF FOOT FRACTURE  2005   left        Home Medications    Prior to Admission medications   Medication Sig Start Date End Date Taking? Authorizing Provider  acetaminophen (TYLENOL) 500 MG tablet Take 500 mg by mouth every 6 (six) hours as needed for mild pain.     [provider]  amLODipine (NORVASC) 10 MG tablet Take 1 tablet (10 mg total) by mouth daily. 05/15/18   Brunetta Genera, MD  aspirin 81 MG tablet Take 81 mg by mouth daily.    [provider]  dexamethasone (DECADRON) 4 MG tablet Take 2 tablets (8 mg total) by mouth 2 (two) times daily with a meal. Take two times a day  starting the day after chemotherapy for 3 days. 01/19/18   Brunetta Genera, MD  hydrocortisone (ANUSOL-HC) 2.5 % rectal cream Place 1 application rectally 2 (two) times daily as needed for hemorrhoids. Patient not taking: Reported on 04/16/2018 01/19/18   Brunetta Genera, MD  lisinopril (PRINIVIL,ZESTRIL) 20 MG tablet Take 1 tablet (20 mg total) by mouth daily. 05/14/18   Brunetta Genera, MD  LORazepam (ATIVAN) 0.5 MG tablet Take 1 tablet (0.5 mg total) by mouth every 6 (six) hours as needed (Nausea or vomiting). Patient not taking: Reported on 04/16/2018 01/19/18   Brunetta Genera, MD  Multiple Vitamins-Minerals (MULTIVITAMIN WITH MINERALS) tablet Take 1 tablet by mouth daily.    [provider]  prochlorperazine (COMPAZINE) 10 MG tablet Take 1 tablet (10 mg total) by mouth every 6 (six) hours as needed (Nausea or vomiting). Patient not taking: Reported on 04/16/2018 01/19/18   Brunetta Genera, MD  sulfamethoxazole-trimethoprim (BACTRIM DS,SEPTRA DS) 800-160 MG tablet Take 1 tablet by mouth daily. 05/14/18   Brunetta Genera, MD  tamsulosin (FLOMAX) 0.4 MG CAPS capsule Take 1 capsule (0.4 mg total) by mouth daily after supper. 04/13/18   Brunetta Genera, MD    Family History Family History  Problem Relation Age of Onset  . Breast cancer Mother   . Diabetes Father   . Hypertension Father   . Stroke Father   . Mental illness Sister   . Hypertension Daughter   . Mental illness Daughter   . Hypertension Brother   . Colon cancer Brother   . Breast cancer Sister     Social History Social History   Tobacco Use  . Smoking status: Current Every Day Smoker    Packs/day: 0.50    Years: 36.00    Pack years: 18.00    Types: Cigarettes  . Smokeless tobacco: Never Used  Substance Use Topics  . Alcohol use: Yes    Alcohol/week: 15.0 standard drinks    Types: 10 Cans of beer, 5 Shots of liquor per week    Comment: weekends  . Drug use: Yes    Types: Cocaine    Comment: reports cocaine usage ~2X/ month; last use 12/26/17     Allergies   Bee venom   Review of Systems Review of Systems  All other systems reviewed and are negative.    Physical Exam Updated Vital Signs BP 140/85 (BP Location: Left Arm)   Pulse 98   Temp 98.6 F (37 C) (Oral)   Resp 17   SpO2 100%   Physical Exam Vitals signs and nursing note reviewed.  Constitutional:      General: He is not in acute distress.    Appearance: Normal appearance. He is well-developed. He is not toxic-appearing.  HENT:     Head: Normocephalic and atraumatic.  Eyes:     General: Lids are normal.     Conjunctiva/sclera: Conjunctivae normal.     Pupils: Pupils are equal, round, and reactive  to light.     Comments: Pale conjunctivo-noted  Neck:     Musculoskeletal: Normal range of motion and neck supple.     Thyroid: No thyroid mass.     Trachea: No tracheal deviation.  Cardiovascular:     Rate and Rhythm: Normal rate and regular rhythm.     Heart sounds: Normal heart sounds. No murmur. No gallop.   Pulmonary:     Effort: Pulmonary effort is normal. No respiratory distress.     Breath sounds:  Normal breath sounds. No stridor. No decreased breath sounds, wheezing, rhonchi or rales.  Abdominal:     General: Bowel sounds are normal. There is no distension.     Palpations: Abdomen is soft.     Tenderness: There is no abdominal tenderness. There is no rebound.  Musculoskeletal: Normal range of motion.        General: No tenderness.  Skin:    General: Skin is warm and dry.     Findings: No abrasion or rash.  Neurological:     Mental Status: He is alert and oriented to person, place, and time.     GCS: GCS eye subscore is 4. GCS verbal subscore is 5. GCS motor subscore is 6.     Cranial Nerves: No cranial nerve deficit.     Sensory: No sensory deficit.  Psychiatric:        Speech: Speech normal.        Behavior: Behavior normal.      ED Treatments / Results  Labs (all labs ordered are listed, but only abnormal results are displayed) Labs Reviewed  BASIC METABOLIC PANEL  CBC  I-STAT TROPONIN, ED  TYPE AND SCREEN    EKG EKG Interpretation  Date/Time:  Tuesday May 22 2018 10:42:21 EST Ventricular Rate:  99 PR Interval:    QRS Duration: 151 QT Interval:  401 QTC Calculation: 515 R Axis:   -84 Text Interpretation:  Sinus rhythm Probable left atrial enlargement Right bundle branch block Lateral infarct, acute Probable anterior infarct, age indeterminate No significant change since last tracing Confirmed by Lacretia Leigh ([id number]) on 05/22/2018 11:12:37 AM   Radiology No results found.  Procedures Procedures (including critical care time)  Medications  Ordered in ED Medications - No data to display   Initial Impression / Assessment and Plan / ED Course  I have reviewed the triage vital signs and the nursing notes.  Pertinent labs & imaging results that were available during my care of the patient were reviewed by me and considered in my medical decision making (see chart for details).     Patient does have prior history of anemia hemoglobin here was stable.  Patient notes he did do increased activity yesterday more than he normally does.  Because patient had dyspnea with history of cancer had a chest CT which was negative for PE.  Patient's pain is reproducible along his chest wall.  EKG without acute findings and troponin negative.  Will treat with muscle relaxants and NSAIDs  Final Clinical Impressions(s) / ED Diagnoses   Final diagnoses:  None    ED Discharge Orders    None       Lacretia Leigh, MD 05/22/18 1513

## 2018-05-23 NOTE — Progress Notes (Signed)
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 male is here because of left lower extremity edema and lymphadenopathy.  The patient was seen in the emergency room this past Friday for the same issue.  A CT of the abdomen and pelvis was performed showing bulky left inguinal, left hemipelvic, and retroperitoneal adenopathy.  He was referred to [place] from the emergency room for further evaluation.  Doppler ultrasound of the left lower extremity was performed and was negative for DVT. Patient reports that he has been having left lower extremity edema in his left groin and left leg for approximately 1 month.  He states that the swelling in the left groin started to get better but then worsened.  The left lower extremity edema has slowly worsened over time.  Patient denies having fevers and chills.  He reports that he does have night sweats at times.  He reported having [place] approximate 1 [place]  He thinks his headaches are related to not having his blood pressure medication.  His headaches have now resolved.  He denies visual changes.  The patient denies chest pain, shortness of breath and cough.  No nausea, vomiting, constipation, diarrhea.  Denies abdominal pain.  The patient denies recent weight loss and has actually gained weight recently.  Patient denies epistaxis, bleeding gums, hemoptysis, hematuria, but occasionally, and melena.  He reports increased urinary frequency over the past month but no dysuria.  The patient is here for evaluation and discussion of his recent CT and lab findings.  Interval  History:   Jesus [name] returns today for management and evaluation of his T-Cell/histocyte rich Large B-Cell Lymphoma. I last saw the pt with C5 EPOCH-R discharge on 05/14/18. [name].   The pt reports that he had muscle spasms across the top of his chest a few days ago, which resolved after using a muscle relaxer upon presentation to the ED accompanied by evaluation. The pt notes that he also saw his urologist in the interim and he has continued on his preventative antibiotics. The pt is enjoying improved energy levels and notes that he has been maintaining his nutritional status very well. The pt denies any concerns for infection at this time and also denies incontinence.   Lab results today (05/24/18) of CBC w/diff and CMP is as follows: all values are WNL except for RBC at 3.55, HGB at 10.3, HCT at 31.9, RDW at 19.2, PLT at 131k, Monocytes abs at 1.7k, Abs immature granulocytes at 1.09k, Glucose at 163. 05/24/18 LDH at 286  On review of systems, pt reports eating well, improved energy levels, moving his bowels, urinating well, and denies incontinence, leg swelling, abdominal pains, fevers, chills, concerns for infections, chest pain, and any other symptoms.    MEDICAL HISTORY:  Past Medical History:  Diagnosis Date  . Allergy   . History of kidney stones   . Hyperlipidemia   . Hypertension   . Lymphadenopathy   . Pain, lower leg    Bilateral  . Peripheral arterial disease ([place])   .  Pre-diabetes   . Red-green color blindness   . Snores   . Wears glasses     SURGICAL HISTORY: Past Surgical History:  Procedure Laterality Date  . CATARACT EXTRACTION W/ INTRAOCULAR LENS  IMPLANT, BILATERAL    . COLONOSCOPY    . dislodged salava stone    . FRACTURE SURGERY    . HAND ARTHROPLASTY  1995   crushed left hand  . INGUINAL LYMPH NODE BIOPSY Left 01/02/2018   Procedure: LEFT INGUINAL LYMPH NODE BIOPSY;  Surgeon: Rolm Bookbinder, MD;  Location: [facility];   Service: General;  Laterality: Left;  . IR IMAGING GUIDED PORT INSERTION  01/15/2018  . MICROLARYNGOSCOPY Left 01/17/2014   Procedure: MICROLARYNGOSCOPY WITH EXCISION OF THE BIOPSY OF LEFT VOCAL CORD LESION;  Surgeon: Izora Gala, MD;  Location: [facility];  Service: ENT;  Laterality: Left;  . ORIF FOOT FRACTURE  2005   left    SOCIAL HISTORY: Social History   Socioeconomic History  . Marital status: Divorced    Spouse name: Not on file  . Number of children: 3  . Years of education: Not on file  . Highest education level: Not on file  Occupational History  . Not on file  Social Needs  . Financial resource strain: Not on file  . Food insecurity:    Worry: Not on file    Inability: Not on file  . Transportation needs:    Medical: Not on file    Non-medical: Not on file  Tobacco Use  . Smoking status: Current Every Day Smoker    Packs/day: 0.50    Years: 36.00    Pack years: 18.00    Types: Cigarettes  . Smokeless tobacco: Never Used  Substance and Sexual Activity  . Alcohol use: Yes    Alcohol/week: 15.0 standard drinks    Types: 10 Cans of beer, 5 Shots of liquor per week    Comment: weekends  . Drug use: Yes    Types: Cocaine    Comment: reports cocaine usage ~2X/ month; last use 12/26/17  . Sexual activity: Not on file  Lifestyle  . Physical activity:    Days per week: Not on file    Minutes per session: Not on file  . Stress: Not on file  Relationships  . Social connections:    Talks on phone: Not on file    Gets together: Not on file    Attends religious service: Not on file    Active member of club or organization: Not on file    Attends meetings of clubs or organizations: Not on file    Relationship status: Not on file  . Intimate partner violence:    Fear of current or ex partner: Not on file    Emotionally abused: Not on file    Physically abused: Not on file    Forced sexual activity: Not on file  Other Topics Concern  . Not on file    Social History Narrative  . Not on file    FAMILY HISTORY: Family History  Problem Relation Age of Onset  . Breast cancer Mother   . Diabetes Father   . Hypertension Father   . Stroke Father   . Mental illness Sister   . Hypertension Daughter   . Mental illness Daughter   . Hypertension Brother   . Colon cancer Brother   . Breast cancer Sister     ALLERGIES:  is allergic to bee venom.  MEDICATIONS:  Current Outpatient  Medications  Medication Sig Dispense Refill  . acetaminophen (TYLENOL) 500 MG tablet Take 500 mg by mouth daily as needed for mild pain.     Marland Kitchen amLODipine (NORVASC) 10 MG tablet Take 1 tablet (10 mg total) by mouth daily. 30 tablet 2  . aspirin 81 MG tablet Take 81 mg by mouth daily.    Marland Kitchen dexamethasone (DECADRON) 4 MG tablet Take 2 tablets (8 mg total) by mouth 2 (two) times daily with a meal. Take two times a day starting the day after chemotherapy for 3 days. (Patient not taking: Reported on 05/22/2018) 30 tablet 1  . hydrocortisone (ANUSOL-HC) 2.5 % rectal cream Place 1 application rectally 2 (two) times daily as needed for hemorrhoids. (Patient not taking: Reported on 05/22/2018) 30 g 0  . ibuprofen (ADVIL,MOTRIN) 400 MG tablet Take 1 tablet (400 mg total) by mouth every 6 (six) hours as needed. 30 tablet 0  . lisinopril (PRINIVIL,ZESTRIL) 20 MG tablet Take 1 tablet (20 mg total) by mouth daily. 30 tablet 1  . LORazepam (ATIVAN) 0.5 MG tablet Take 1 tablet (0.5 mg total) by mouth every 6 (six) hours as needed (Nausea or vomiting). (Patient not taking: Reported on 05/22/2018) 30 tablet 0  . methocarbamol (ROBAXIN-750) 750 MG tablet Take 1 tablet (750 mg total) by mouth 4 (four) times daily. 30 tablet 0  . Multiple Vitamins-Minerals (MULTIVITAMIN WITH MINERALS) tablet Take 1 tablet by mouth daily.    . prochlorperazine (COMPAZINE) 10 MG tablet Take 1 tablet (10 mg total) by mouth every 6 (six) hours as needed (Nausea or vomiting). (Patient not taking: Reported on  05/22/2018) 30 tablet 1  . sulfamethoxazole-trimethoprim (BACTRIM DS,SEPTRA DS) 800-160 MG tablet Take 1 tablet by mouth daily. 30 tablet 1  . tamsulosin (FLOMAX) 0.4 MG CAPS capsule Take 1 capsule (0.4 mg total) by mouth daily after supper. 30 capsule 2   No current facility-administered medications for this visit.     REVIEW OF SYSTEMS:    A 10+ POINT REVIEW OF SYSTEMS WAS OBTAINED including neurology, dermatology, psychiatry, cardiac, respiratory, lymph, extremities, GI, GU, Musculoskeletal, constitutional, breasts, reproductive, HEENT.  All pertinent positives are noted in the HPI.  All others are negative.   PHYSICAL EXAMINATION: ECOG PERFORMANCE STATUS: 1 - Symptomatic but completely ambulatory  Vitals:   05/24/18 1212  BP: 122/69  Pulse: (!) 111  Resp: 18  Temp: 98.4 F (36.9 C)  SpO2: 96%   Filed Weights   05/24/18 1212  Weight: 258 lb 8 oz (117.3 kg)    GENERAL:alert, in no acute distress and comfortable SKIN: no acute rashes, no significant lesions EYES: conjunctiva are pink and non-injected, sclera anicteric OROPHARYNX: MMM, no exudates, no oropharyngeal erythema or ulceration NECK: supple, no JVD LYMPH:  no palpable lymphadenopathy in the cervical, axillary or inguinal regions LUNGS: clear to auscultation b/l with normal respiratory effort HEART: regular rate & rhythm ABDOMEN:  normoactive bowel sounds , non tender, not distended. No palpable hepatosplenomegaly.  Extremity: no pedal edema PSYCH: alert & oriented x 3 with fluent speech NEURO: no focal motor/sensory deficits   LABORATORY DATA:  I have reviewed the data as listed CBC Latest Ref Rng & Units 05/27/2018 05/24/2018 05/22/2018  WBC 4.0 - 10.5 K/uL 7.8 10.1 3.3(L)  Hemoglobin 13.0 - 17.0 g/dL 10.6(L) 10.3(L) 10.7(L)  Hematocrit 39.0 - 52.0 % 33.4(L) 31.9(L) 34.2(L)  Platelets 150 - 400 K/uL 222 131(L) 103(L)    CMP Latest Ref Rng & Units 05/24/2018 05/22/2018 05/14/2018  Glucose 70 -  99 mg/dL 163(H)  119(H) 209(H)  BUN 8 - 23 mg/dL 11 16 18   Creatinine 0.61 - 1.24 mg/dL 1.10 0.84 0.74  Sodium 135 - 145 mmol/L 143 140 139  Potassium 3.5 - 5.1 mmol/L 3.7 3.5 3.5  Chloride 98 - 111 mmol/L 108 102 104  CO2 22 - 32 mmol/L 26 27 29   Calcium 8.9 - 10.3 mg/dL 9.8 9.7 9.0  Total Protein 6.5 - 8.1 g/dL 6.9 - 6.1(L)  Total Bilirubin 0.3 - 1.2 mg/dL 0.3 - 0.7  Alkaline Phos 38 - 126 U/L 86 - 51  AST 15 - 41 U/L 18 - 16  ALT 0 - 44 U/L 16 - 20   Component     Latest Ref Rng & Units 05/03/2018  Sodium     135 - 145 mmol/L 142  Potassium     3.5 - 5.1 mmol/L 3.9  Chloride     98 - 111 mmol/L 109  CO2     22 - 32 mmol/L 25  Glucose     70 - 99 mg/dL 165 (H)  BUN     8 - 23 mg/dL 12  Creatinine     0.61 - 1.24 mg/dL 1.09  Calcium     8.9 - 10.3 mg/dL 9.5  Total Protein     6.5 - 8.1 g/dL 6.6  Albumin     3.5 - 5.0 g/dL 3.1 (L)  AST     15 - 41 U/L 21  ALT     0 - 44 U/L 11  Alkaline Phosphatase     38 - 126 U/L 90  Total Bilirubin     0.3 - 1.2 mg/dL 0.2 (L)  GFR, Est Non African American     >60 mL/min >60  GFR, Est African American     >60 mL/min >60  Anion gap     5 - 15 8  Magnesium     1.7 - 2.4 mg/dL 1.6 (L)   01/02/18 Left Inguinal LN Bx:   12/26/17 Tissue Flow Cytometry:   12/26/17 Inguinal Core biopsy:    RADIOGRAPHIC STUDIES: I have personally reviewed the radiological images as listed and agreed with the findings in the report. Dg Chest 2 View  Result Date: 05/22/2018 CLINICAL DATA:  Low back pain, chest pain EXAM: CHEST - 2 VIEW COMPARISON:  04/16/2018 FINDINGS: There is a right-sided Port-A-Cath with the tip projecting over the SVC. There is no focal parenchymal opacity. There is no pleural effusion or pneumothorax. The heart size is top-normal. The osseous structures are unremarkable. IMPRESSION: No active cardiopulmonary disease. Electronically Signed   By: Kathreen Devoid   On: 05/22/2018 12:04   Ct Angio Chest Pe W/cm &/or Wo Cm  Result Date:  05/22/2018 CLINICAL DATA:  Onset chest pain last night which has since resolved. History of lymphoma. EXAM: CT ANGIOGRAPHY CHEST WITH CONTRAST TECHNIQUE: Multidetector CT imaging of the chest was performed using the standard protocol during bolus administration of intravenous contrast. Multiplanar CT image reconstructions and MIPs were obtained to evaluate the vascular anatomy. CONTRAST:  100 mL ISOVUE-370 IOPAMIDOL (ISOVUE-370) INJECTION 76% COMPARISON:  PA and lateral chest earlier today. PET CT scan 03/14/2018. FINDINGS: Cardiovascular: No pulmonary embolus is identified. Heart size is upper normal. No pericardial effusion. No atherosclerosis. Port-A-Cath noted. Mediastinum/Nodes: No enlarged mediastinal, hilar, or axillary lymph nodes. Thyroid gland, trachea, and esophagus demonstrate no significant findings. Lungs/Pleura: No pleural effusion. Lungs demonstrate only mild dependent atelectasis. Upper Abdomen: There is partial visualization of  cysts in kidneys which are unchanged. Imaged intra-abdominal contents are otherwise unremarkable. Musculoskeletal: No acute or focal abnormality. Review of the MIP images confirms the above findings. IMPRESSION: Negative for pulmonary embolus.  No acute disease. Electronically Signed   By: Inge Rise M.D.   On: 05/22/2018 14:53    ASSESSMENT & PLAN:   This is a pleasant 63 y.o. 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  CT of the abdomen and pelvis performed on 12/22/2017 showed bulky left inguinal, left hemipelvic, and retroperitoneal adenopathy.   01/02/18 Left inguinal LN Biopsy revealed T-Cell/histocyte rich Large B-Cell Lymphoma  12/27/17 ECHO revealed LV EF of 55-60%    01/05/18 PET/CT revealedMassively enlarged pelvic lymph nodes intense metabolic activity consistent lymphoma. 2. Additional hypermetabolic lymph nodes in the porta hepatis and retroperitoneum LEFT aorta. 3. Solitary hypermetabolic mediastinal lymph node in the upper LEFT Mediastinum. 4. 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 5. Normal spleen.  HIV non reactive on 12/22/2017.Hep C and hep B serology negative.  03/14/18 PET/CT revealedPET-CT findings suggest an excellent response to chemotherapy. The abdominal lymphadenopathy has near completely resolved and demonstrates a near complete metabolic response. The pelvic and inguinal adenopathy has significantly decreased in size and the metabolic activity has significantly decreased. 2. Diffuse marrow activity likely due to chemotherapy and or marrow stimulating drugs. I do not see any discrete persistent lesions.   04/17/18 CT Head revealedSubtle mesial caudothalamic hypodensities may be artifact though, the could reflect encephalitis or Wernicke's encephalopathy. Consider MRI of the head with and without contrast. 2. Mild chronic small vessel ischemic changes  2) left lower extremity swelling- now resolved Doppler ultrasound for DVT was negative in the left lower extremity.  Likely from venous compression +/- lymphatic obstruction from bulky left inguinal, left hemipelvic, and retroperitoneal adenopathy.   3) S/p Port a cath placement   4)h/oE.coli UTI - Pansensitive -Resolved.Also appearedto have BPH like symptomatology. -Was hospitalized 04/16/18 to 04/20/18 for E.coli sepsis from UTI, treated with antibiotics.  5)s/pE.COLi sepsis - likely from urinary source. Recent h/o E.coli UTI  6) s/p Symptomatic Anemia Hgb 6.7 - due to chemotherapy/sepsis- s/p PRBC transfusion. No overt evidence of bleeding. hgb now stable at 10.6  7) DVT prophylaxis -lovenoxSC in the  hospital  8) HTN-was elevated in setting of improved po intake and steroids -improving control -conitnueon lisinopril 20 mg p.o. daily today -Continue Flomax for his BPH. -increasedamlodipine to 10 mg  To optimize BP control -will monitor and optimize rx accordingly.  9) Likely BPH - on flomax  PLAN: -Discussed pt labwork today, 05/24/18; WBC normal at 10.1k, HGB stable at 10.3, PLT improving now to 131k, chemistries are stable. LDH at 286.  -The pt has no prohibitive toxicities from beginning C6 EPOCH-R next week.   -Will complete PET/CT for end of treatment efficacy evaluation  -Continue prophylactic Bactrim while not admitted for treatment -continueLisinopril and amlodipine adjusted to optimize blood pressure control -transfuse prn for hgb<8 - hgb stable at 9.7 at this time. -f/u with urology for evaluation of his prostate and recurrent UTIs -Recommended that the pt continue to eat well, drink at least 48-64 oz of water each day, and walk 20-30 minutes each day.  -Will see the pt back on 05/28/18 with C6 EPOCH-R admission   Orders Placed This  Encounter  Procedures  . NM PET Image Restag (PS) Skull Base To Thigh    Standing Status:   Future    Standing Expiration Date:   05/24/2019    Order Specific Question:   ** REASON FOR EXAM (FREE TEXT)    Answer:   T cell rich B cell lymphoma s/p 6 cycles of chemo/immunotherapy for evaluation and further treatment strategy    Order Specific Question:   If indicated for the ordered procedure, I authorize the administration of a radiopharmaceutical per Radiology protocol    Answer:   Yes    Order Specific Question:   Preferred imaging location?    Answer:   [facility]    Order Specific Question:   Radiology Contrast Protocol - do NOT remove file path    Answer:   [email or web address]  . CBC with Differential/Platelet    Standing Status:   Future    Standing Expiration Date:   06/28/2019  . CMP ([place] only)    Standing Status:   Future    Standing Expiration Date:   05/25/2019  . CBC with Differential/Platelet    Standing Status:   Future    Standing Expiration Date:   06/28/2019  . CMP ([place] only)    Standing Status:   Future    Standing Expiration Date:   05/25/2019  . Lactate dehydrogenase    Standing Status:   Future    Standing Expiration Date:   05/25/2019  . Sample to Blood Bank    Standing Status:   Future    Standing Expiration Date:   05/25/2019    Admit for Inpatient [facility] on 05/28/2018 for 5 days  Plz schedule outpatient Rituxan and neulasta on 06/04/2018 RTC with Dr Irene Limbo with labs on 06/11/2018 PET/CT in 5 weeks RTC with Dr Irene Limbo with labs in 6weeks   All questions were answered. The patient knows to call the clinic with any problems, questions or concerns.  The total time spent in the appt was 25 minutes and more than 50% was on counseling and direct patient cares.   Sullivan Lone MD [place] AAHIVMS [facility] Hematology/Oncology Physician [facility]  (Office):       [phone] (Work cell):  [phone] (Fax):           [phone]  I, Baldwin Jamaica, am acting as a scribe for Dr. Sullivan Lone.   .I have reviewed the above documentation for accuracy and completeness, and I agree with the above. Brunetta Genera MD

## 2018-05-24 ENCOUNTER — Telehealth: Payer: Self-pay | Admitting: Hematology

## 2018-05-24 ENCOUNTER — Inpatient Hospital Stay (HOSPITAL_BASED_OUTPATIENT_CLINIC_OR_DEPARTMENT_OTHER): Payer: 59 | Admitting: Hematology

## 2018-05-24 ENCOUNTER — Inpatient Hospital Stay: Payer: 59

## 2018-05-24 VITALS — BP 122/69 | HR 111 | Temp 98.4°F | Resp 18 | Ht 72.0 in | Wt 258.5 lb

## 2018-05-24 DIAGNOSIS — Z791 Long term (current) use of non-steroidal anti-inflammatories (NSAID): Secondary | ICD-10-CM

## 2018-05-24 DIAGNOSIS — C8338 Diffuse large B-cell lymphoma, lymph nodes of multiple sites: Secondary | ICD-10-CM | POA: Diagnosis not present

## 2018-05-24 DIAGNOSIS — Z79899 Other long term (current) drug therapy: Secondary | ICD-10-CM

## 2018-05-24 DIAGNOSIS — R600 Localized edema: Secondary | ICD-10-CM

## 2018-05-24 DIAGNOSIS — D649 Anemia, unspecified: Secondary | ICD-10-CM

## 2018-05-24 DIAGNOSIS — R61 Generalized hyperhidrosis: Secondary | ICD-10-CM

## 2018-05-24 DIAGNOSIS — Z86718 Personal history of other venous thrombosis and embolism: Secondary | ICD-10-CM

## 2018-05-24 DIAGNOSIS — I739 Peripheral vascular disease, unspecified: Secondary | ICD-10-CM

## 2018-05-24 DIAGNOSIS — I10 Essential (primary) hypertension: Secondary | ICD-10-CM

## 2018-05-24 DIAGNOSIS — Z7982 Long term (current) use of aspirin: Secondary | ICD-10-CM

## 2018-05-24 DIAGNOSIS — F17210 Nicotine dependence, cigarettes, uncomplicated: Secondary | ICD-10-CM

## 2018-05-24 DIAGNOSIS — N401 Enlarged prostate with lower urinary tract symptoms: Secondary | ICD-10-CM

## 2018-05-24 DIAGNOSIS — E785 Hyperlipidemia, unspecified: Secondary | ICD-10-CM

## 2018-05-24 DIAGNOSIS — R3914 Feeling of incomplete bladder emptying: Secondary | ICD-10-CM | POA: Diagnosis not present

## 2018-05-24 DIAGNOSIS — Z125 Encounter for screening for malignant neoplasm of prostate: Secondary | ICD-10-CM | POA: Diagnosis not present

## 2018-05-24 DIAGNOSIS — N281 Cyst of kidney, acquired: Secondary | ICD-10-CM | POA: Diagnosis not present

## 2018-05-24 LAB — CMP (CANCER CENTER ONLY)
ALT: 16 U/L (ref 0–44)
AST: 18 U/L (ref 15–41)
Albumin: 4 g/dL (ref 3.5–5.0)
Alkaline Phosphatase: 86 U/L (ref 38–126)
Anion gap: 9 (ref 5–15)
BUN: 11 mg/dL (ref 8–23)
CO2: 26 mmol/L (ref 22–32)
Calcium: 9.8 mg/dL (ref 8.9–10.3)
Chloride: 108 mmol/L (ref 98–111)
Creatinine: 1.1 mg/dL (ref 0.61–1.24)
GFR, Est AFR Am: >60 mL/min (ref >60)
GFR, Estimated: >60 mL/min (ref >60)
Glucose, Bld: 163 mg/dL — ABNORMAL HIGH (ref 70–99)
Potassium: 3.7 mmol/L (ref 3.5–5.1)
Sodium: 143 mmol/L (ref 135–145)
Total Bilirubin: 0.3 mg/dL (ref 0.3–1.2)
Total Protein: 6.9 g/dL (ref 6.5–8.1)

## 2018-05-24 LAB — CBC WITH DIFFERENTIAL/PLATELET
Abs Immature Granulocytes: 1.09 10*3/uL — ABNORMAL HIGH (ref 0.00–0.07)
Basophils Absolute: 0.1 10*3/uL (ref 0.0–0.1)
Basophils Relative: 1 %
Eosinophils Absolute: 0.1 10*3/uL (ref 0.0–0.5)
Eosinophils Relative: 1 %
HCT: 31.9 % — ABNORMAL LOW (ref 39.0–52.0)
Hemoglobin: 10.3 g/dL — ABNORMAL LOW (ref 13.0–17.0)
Immature Granulocytes: 11 %
Lymphocytes Relative: 16 %
Lymphs Abs: 1.6 10*3/uL (ref 0.7–4.0)
MCH: 29 pg (ref 26.0–34.0)
MCHC: 32.3 g/dL (ref 30.0–36.0)
MCV: 89.9 fL (ref 80.0–100.0)
Monocytes Absolute: 1.7 10*3/uL — ABNORMAL HIGH (ref 0.1–1.0)
Monocytes Relative: 17 %
Neutro Abs: 5.6 10*3/uL (ref 1.7–7.7)
Neutrophils Relative %: 54 %
Platelets: 131 10*3/uL — ABNORMAL LOW (ref 150–400)
RBC: 3.55 MIL/uL — ABNORMAL LOW (ref 4.22–5.81)
RDW: 19.2 % — ABNORMAL HIGH (ref 11.5–15.5)
WBC: 10.1 10*3/uL (ref 4.0–10.5)
nRBC: 0.2 % (ref 0.0–0.2)

## 2018-05-24 LAB — LACTATE DEHYDROGENASE: LDH: 286 U/L — ABNORMAL HIGH (ref 98–192)

## 2018-05-24 NOTE — Telephone Encounter (Signed)
Printed calendar and avs. °

## 2018-05-27 ENCOUNTER — Other Ambulatory Visit: Payer: Self-pay

## 2018-05-27 ENCOUNTER — Emergency Department (HOSPITAL_COMMUNITY): Payer: 59

## 2018-05-27 ENCOUNTER — Emergency Department (HOSPITAL_COMMUNITY)
Admission: EM | Admit: 2018-05-27 | Discharge: 2018-05-27 | Disposition: A | Discharge status: Home / Self Care | Payer: 59 | Source: Home / Self Care | Attending: Emergency Medicine | Admitting: Emergency Medicine

## 2018-05-27 ENCOUNTER — Encounter (HOSPITAL_COMMUNITY): Payer: Self-pay

## 2018-05-27 DIAGNOSIS — Z5111 Encounter for antineoplastic chemotherapy: Secondary | ICD-10-CM | POA: Diagnosis not present

## 2018-05-27 DIAGNOSIS — Z79899 Other long term (current) drug therapy: Secondary | ICD-10-CM | POA: Insufficient documentation

## 2018-05-27 DIAGNOSIS — Z7982 Long term (current) use of aspirin: Secondary | ICD-10-CM

## 2018-05-27 DIAGNOSIS — R42 Dizziness and giddiness: Secondary | ICD-10-CM

## 2018-05-27 DIAGNOSIS — R55 Syncope and collapse: Secondary | ICD-10-CM | POA: Diagnosis not present

## 2018-05-27 DIAGNOSIS — I10 Essential (primary) hypertension: Secondary | ICD-10-CM

## 2018-05-27 DIAGNOSIS — C8510 Unspecified B-cell lymphoma, unspecified site: Secondary | ICD-10-CM | POA: Diagnosis not present

## 2018-05-27 DIAGNOSIS — C8590 Non-Hodgkin lymphoma, unspecified, unspecified site: Secondary | ICD-10-CM | POA: Insufficient documentation

## 2018-05-27 DIAGNOSIS — R0902 Hypoxemia: Secondary | ICD-10-CM | POA: Diagnosis not present

## 2018-05-27 DIAGNOSIS — I491 Atrial premature depolarization: Secondary | ICD-10-CM | POA: Diagnosis not present

## 2018-05-27 DIAGNOSIS — F17210 Nicotine dependence, cigarettes, uncomplicated: Secondary | ICD-10-CM | POA: Insufficient documentation

## 2018-05-27 LAB — CBC
HCT: 33.4 % — ABNORMAL LOW (ref 39.0–52.0)
Hemoglobin: 10.6 g/dL — ABNORMAL LOW (ref 13.0–17.0)
MCH: 29.4 pg (ref 26.0–34.0)
MCHC: 31.7 g/dL (ref 30.0–36.0)
MCV: 92.8 fL (ref 80.0–100.0)
Platelets: 222 10*3/uL (ref 150–400)
RBC: 3.6 MIL/uL — ABNORMAL LOW (ref 4.22–5.81)
RDW: 19.4 % — ABNORMAL HIGH (ref 11.5–15.5)
WBC: 7.8 10*3/uL (ref 4.0–10.5)
nRBC: 0.3 % — ABNORMAL HIGH (ref 0.0–0.2)

## 2018-05-27 LAB — BASIC METABOLIC PANEL
Anion gap: 9 (ref 5–15)
CO2: 22 mmol/L (ref 22–32)
Calcium: 9 mg/dL (ref 8.9–10.3)
Chloride: 109 mmol/L (ref 98–111)
Creatinine, Ser: 0.98 mg/dL (ref 0.61–1.24)
GFR calc Af Amer: >60 mL/min (ref >60)
Glucose, Bld: 155 mg/dL — ABNORMAL HIGH (ref 70–99)
Potassium: 3.8 mmol/L (ref 3.5–5.1)
Sodium: 140 mmol/L (ref 135–145)

## 2018-05-27 LAB — BASIC METABOLIC PANEL WITH GFR
BUN: 9 mg/dL (ref 8–23)
GFR calc non Af Amer: >60 mL/min (ref >60)

## 2018-05-27 LAB — URINALYSIS, ROUTINE W REFLEX MICROSCOPIC
Bilirubin Urine: NEGATIVE
Glucose, UA: NEGATIVE mg/dL
Hgb urine dipstick: NEGATIVE
Ketones, ur: NEGATIVE mg/dL
Leukocytes, UA: NEGATIVE
Nitrite: NEGATIVE
Protein, ur: NEGATIVE mg/dL
Specific Gravity, Urine: 1.015 (ref 1.005–1.030)
pH: 5 (ref 5.0–8.0)

## 2018-05-27 LAB — CBG MONITORING, ED: Glucose-Capillary: 162 mg/dL — ABNORMAL HIGH (ref 70–99)

## 2018-05-27 IMAGING — CT CT HEAD W/O CM
3 series · 15 of 47 positions shown, 18 images · non-contrast
Comparison: [DATE]

CLINICAL DATA: Near syncope and dizziness.

Large B-cell lymphoma
EXAM:
CT HEAD WITHOUT CONTRAST
TECHNIQUE: Contiguous axial images were obtained from the base of the skull
through the vertex without intravenous contrast.

[Series 3: head 5.0 h30s · axial · 0.51mm/px · z∈[-118,+27]mm · 9 of 35 slices shown, 12 images]
[im 3/35  brain]
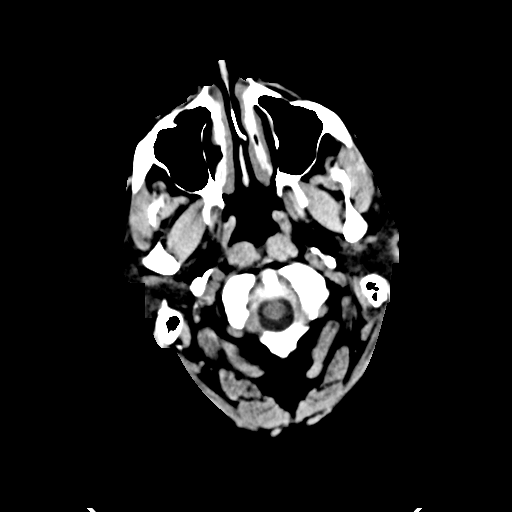
[im 3/35  bone]
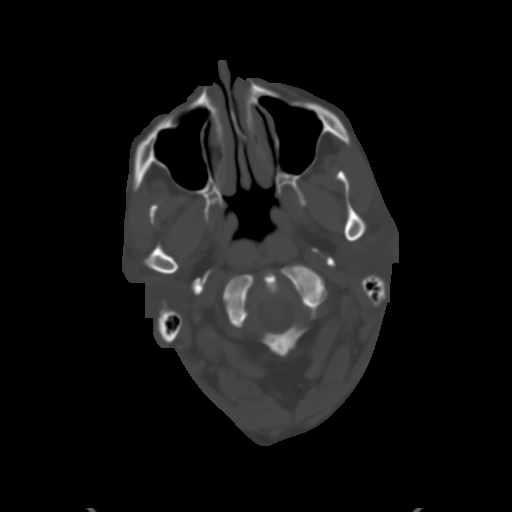
[im 6/35  brain]
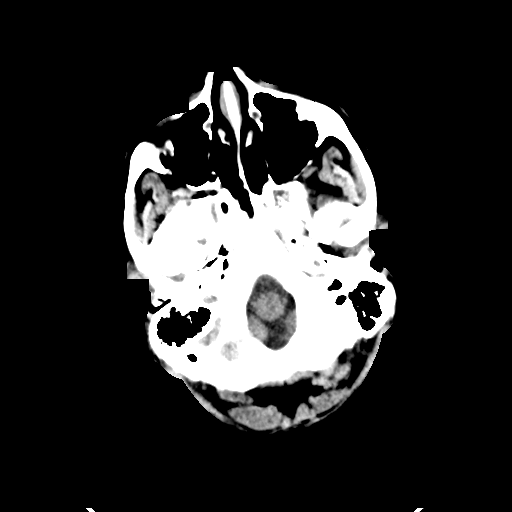
[im 10/35  brain]
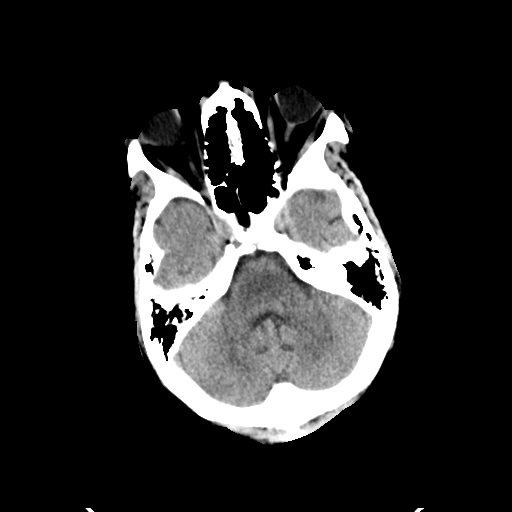
[im 13/35  brain]
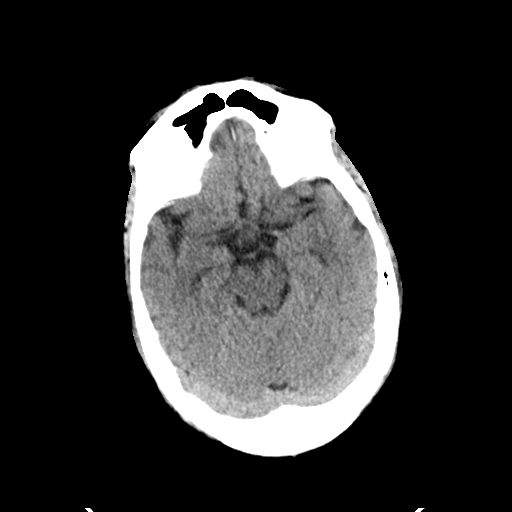
[im 18/35  brain]
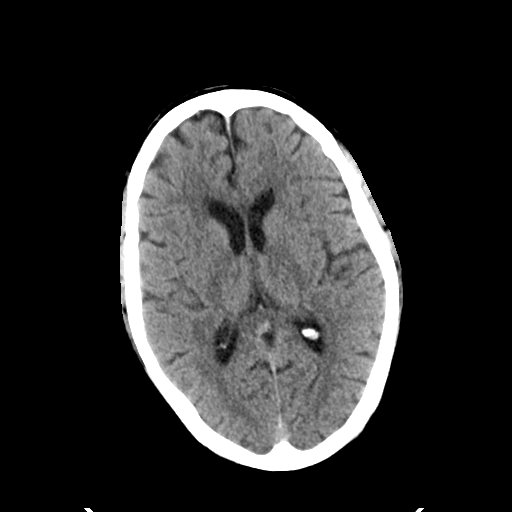
[im 18/35  bone]
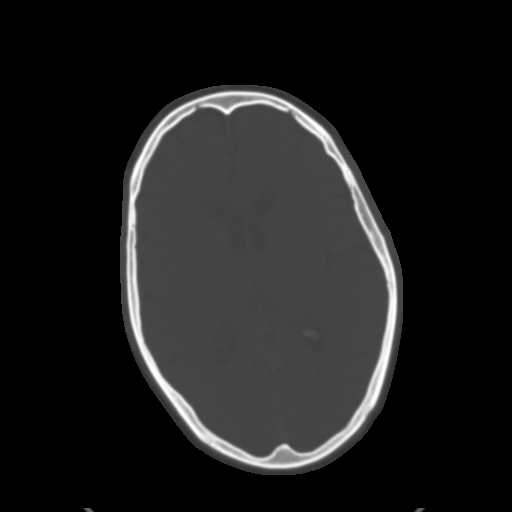
[im 22/35  brain]
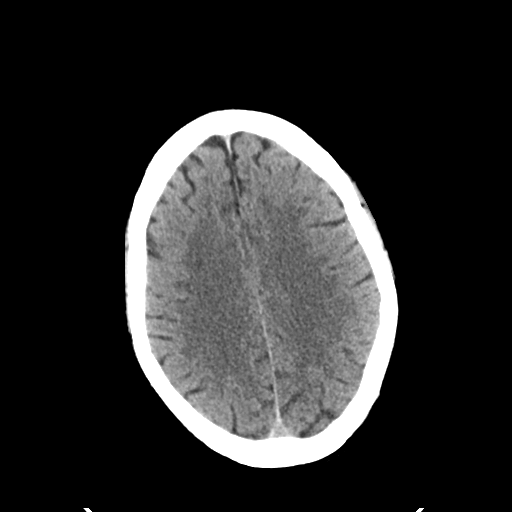
[im 25/35  brain]
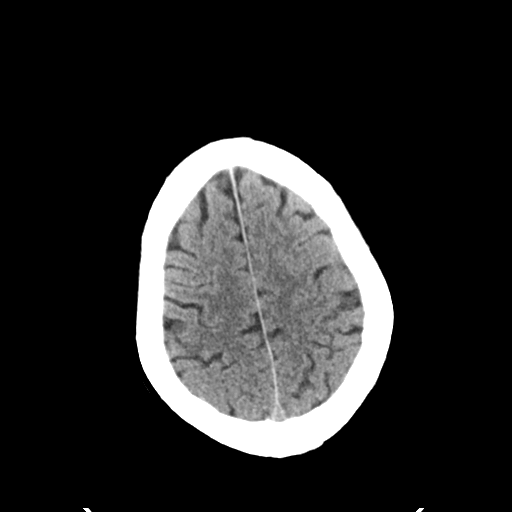
[im 29/35  brain]
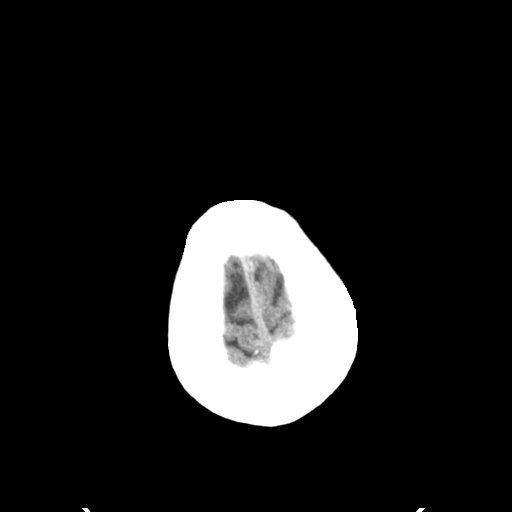
[im 32/35  brain]
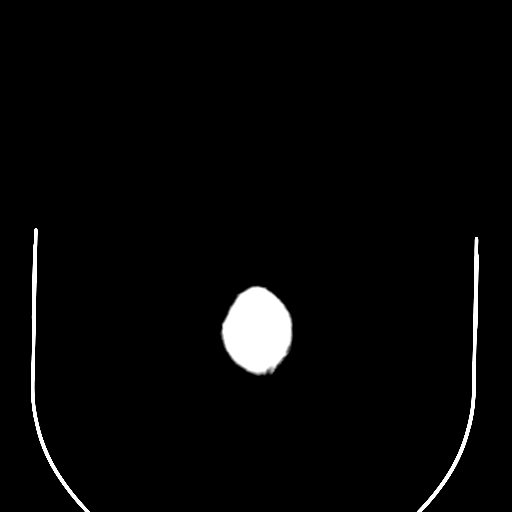
[im 32/35  bone]
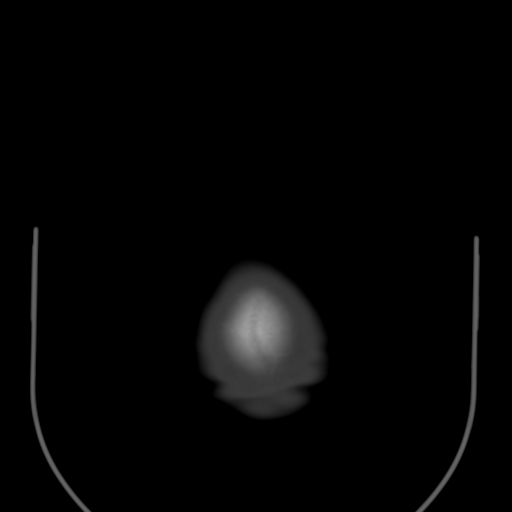

[Series 5: head 3.0 mpr cor · coronal · 0.36mm/px · 3 of 80 slices shown]
[im 27/80  brain]
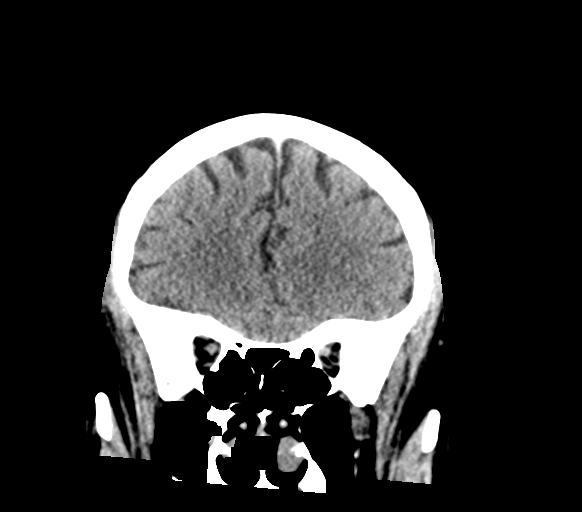
[im 36/80  brain]
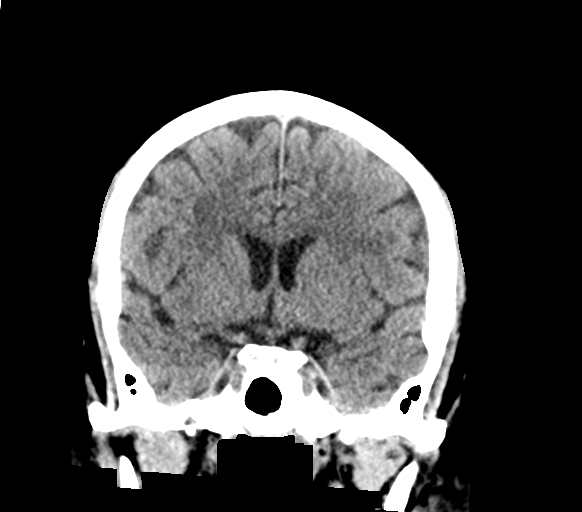
[im 44/80  brain]
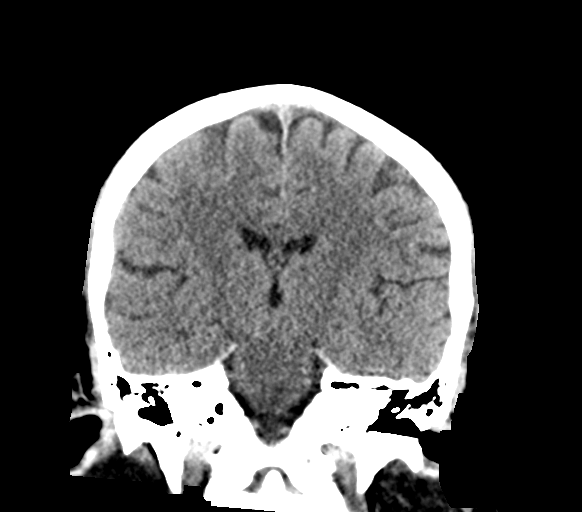

[Series 6: head 3.0 mpr sag · sagittal · 0.38mm/px · 3 of 67 slices shown]
[im 24/67  brain]
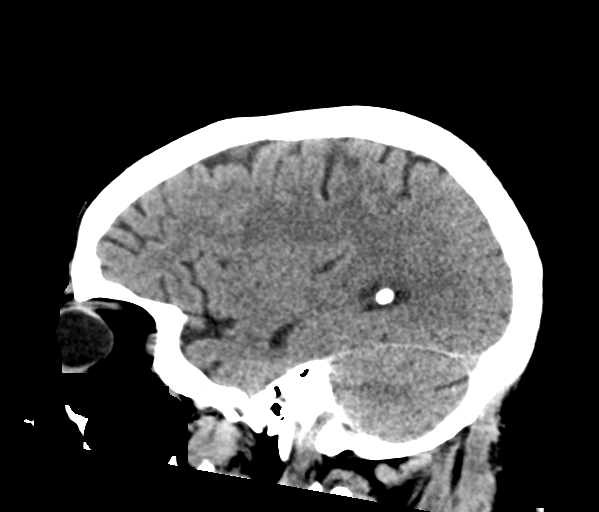
[im 34/67  brain]
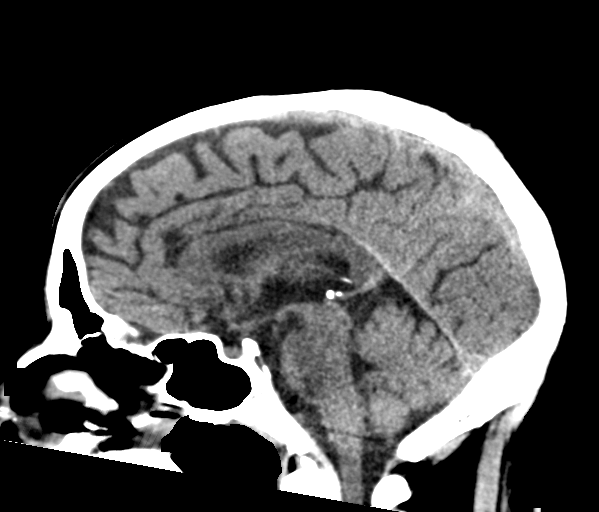
[im 44/67  brain]
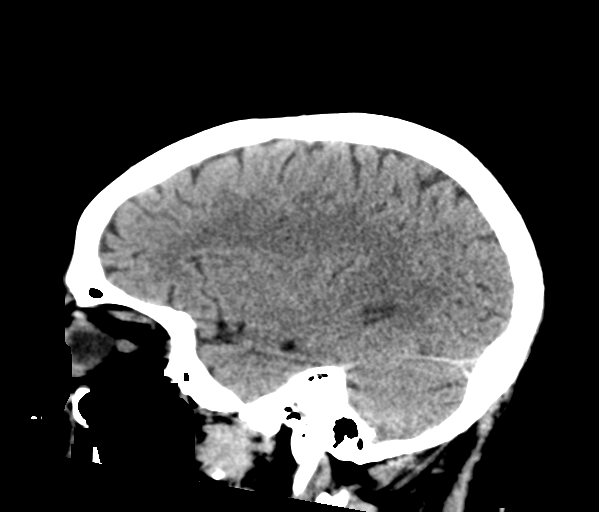

[15 of 47 positions shown; findings below may reference images not displayed]

FINDINGS: Brain: The ventricles are normal in size and configuration. There is
no intracranial mass, hemorrhage, extra-axial fluid collection, or
midline shift. There is mild periventricular small vessel disease.
Elsewhere the brain parenchyma appears unremarkable. There is no
appreciable acute infarct.

Vascular: There is no hyperdense vessel. No appreciable vascular
calcification.

Skull: The bony calvarium appears intact.

Sinuses/Orbits: There is opacification in the posterior right
maxillary antrum. There is slight mucosal thickening in several
ethmoid air cells. Other visualized paranasal sinuses are clear.
Orbits appear symmetric bilaterally.

Other: Mastoid air cells are clear. There is debris in the right
external auditory canal.
IMPRESSION: Mild periventricular small vessel disease. No acute infarct evident.
No mass or hemorrhage.

There are areas of paranasal sinus disease. There is probable
cerumen in the right external auditory canal.

## 2018-05-27 MED ORDER — SODIUM CHLORIDE 0.9% FLUSH: 3.0000 mL | Freq: Once | INTRAVENOUS | Status: DC

## 2018-05-27 NOTE — ED Notes (Signed)
Patient verbalizes understanding of discharge instructions. Opportunity for questioning and answers were provided. Armband removed by staff, pt discharged from ED ambulatory.   

## 2018-05-27 NOTE — Discharge Instructions (Signed)
Follow-up with your primary care doctor as directed.  Make sure you are drinking plenty of fluids and staying hydrated.  Return the emergency department for any chest pain, difficulty breathing, difficulty walking, numbness/weakness of your arms or legs or any other worsening or concerning symptoms.

## 2018-05-27 NOTE — ED Provider Notes (Signed)
[facility] EMERGENCY DEPARTMENT Provider Note   CSN: [id number] Arrival date & time: 05/27/18  1102     History   Chief Complaint Chief Complaint  Patient presents with  . Dizziness    HPI Jesus [name] is a 63 y.o. male past medical history of allergies, non-Hodgkin's lymphoma, pretension, hyperlipidemia who presents for evaluation of an episode of dizziness that occurred about 45 minutes prior to ED arrival.  Patient reports that he was standing and reports that he felt an episode where he felt the room was moving.  He states that it did not feel like it was spinning.  Patient reports he sat down to kind of calm himself down.  He said the episode lasted for about 5 or 10 minutes and resolved on its own.  Patient reports that he went to go stand up and then had a brief episode again which caused him to come to the emergency department.  Patient reports that he has had some intermittent dizziness ever since early chemotherapy but usually only last about 30 seconds.  He states that he was concerned because this episode lasted a little bit longer.  He denies any associated chest pain, difficulty breathing, vision changes.  He states he did not lose consciousness.  He denies any associated nausea, vomiting.  Patient reports that he feels better on ED arrival but wanted to get it checked out.  He does report that he took a muscle relaxer prior to symptoms and does not know if that was contributing.  Patient reports that he is recently undergoing chemo for his non-Hodgkin's lymphoma and reports his last chemo session is this week.  Patient denies any recent sicknesses, fevers, cough, congestion.  Patient denies any vision changes, numbness/weakness of his arms or legs, difficulty breathing, chest pain.  The history is provided by the patient.    Past Medical History:  Diagnosis Date  . Allergy   . History of kidney stones   . Hyperlipidemia   . Hypertension   .  Lymphadenopathy   . Pain, lower leg    Bilateral  . Peripheral arterial disease ([place])   . Pre-diabetes   . Red-green color blindness   . Snores   . Wears glasses     Patient Active Problem List   Diagnosis Date Noted  . Large cell (diffuse) non-Hodgkin's lymphoma ([place]) 05/10/2018  . Bacteremia due to Escherichia coli   . HTN (hypertension) 04/17/2018  . RLS (restless legs syndrome) 04/17/2018  . Abnormal LFTs 04/17/2018  . Acute metabolic encephalopathy 29/56/2130  . AKI (acute kidney injury) ([place]) 04/16/2018  . Dehydration   . Fever, unspecified   . Sepsis ([place])   . Disorientation   . Hypokalemia   . Hypomagnesemia   . Anemia   . Encounter for antineoplastic chemotherapy   . At high risk of tumor lysis syndrome   . Swelling of lower leg   . Diffuse large B cell lymphoma ([place]) 01/15/2018  . 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[place]) 01/12/2018  . Counseling regarding advance care planning and goals of care 01/12/2018  . Bilateral leg pain 05/27/2014    Past Surgical History:  Procedure Laterality Date  . CATARACT EXTRACTION W/ INTRAOCULAR LENS  IMPLANT, BILATERAL    . COLONOSCOPY    . dislodged salava stone    . FRACTURE SURGERY    . HAND ARTHROPLASTY  1995   crushed left hand  . INGUINAL LYMPH NODE BIOPSY Left  01/02/2018   Procedure: LEFT INGUINAL LYMPH NODE BIOPSY;  Surgeon: Rolm Bookbinder, MD;  Location: [facility];  Service: General;  Laterality: Left;  . IR IMAGING GUIDED PORT INSERTION  01/15/2018  . MICROLARYNGOSCOPY Left 01/17/2014   Procedure: MICROLARYNGOSCOPY WITH EXCISION OF THE BIOPSY OF LEFT VOCAL CORD LESION;  Surgeon: Izora Gala, MD;  Location: [facility];  Service: ENT;  Laterality: Left;  . ORIF FOOT FRACTURE  2005   left        Home Medications    Prior to Admission medications   Medication Sig Start Date End Date Taking? Authorizing Provider  acetaminophen (TYLENOL) 500 MG tablet Take 500 mg by mouth  daily as needed for mild pain.    Yes [provider]  amLODipine (NORVASC) 10 MG tablet Take 1 tablet (10 mg total) by mouth daily. 05/15/18  Yes Brunetta Genera, MD  aspirin 81 MG tablet Take 81 mg by mouth daily.   Yes [provider]  ibuprofen (ADVIL,MOTRIN) 400 MG tablet Take 1 tablet (400 mg total) by mouth every 6 (six) hours as needed. Patient taking differently: Take 400 mg by mouth every 6 (six) hours as needed for moderate pain.  05/22/18  Yes Lacretia Leigh, MD  lisinopril (PRINIVIL,ZESTRIL) 20 MG tablet Take 1 tablet (20 mg total) by mouth daily. 05/14/18  Yes Brunetta Genera, MD  methocarbamol (ROBAXIN-750) 750 MG tablet Take 1 tablet (750 mg total) by mouth 4 (four) times daily. 05/22/18  Yes Lacretia Leigh, MD  Multiple Vitamins-Minerals (MULTIVITAMIN WITH MINERALS) tablet Take 1 tablet by mouth daily.   Yes [provider]  sulfamethoxazole-trimethoprim (BACTRIM DS,SEPTRA DS) 800-160 MG tablet Take 1 tablet by mouth daily. 05/14/18  Yes Brunetta Genera, MD  tamsulosin (FLOMAX) 0.4 MG CAPS capsule Take 1 capsule (0.4 mg total) by mouth daily after supper. 04/13/18  Yes Brunetta Genera, MD  dexamethasone (DECADRON) 4 MG tablet Take 2 tablets (8 mg total) by mouth 2 (two) times daily with a meal. Take two times a day starting the day after chemotherapy for 3 days. Patient not taking: Reported on 05/22/2018 01/19/18   Brunetta Genera, MD  hydrocortisone (ANUSOL-HC) 2.5 % rectal cream Place 1 application rectally 2 (two) times daily as needed for hemorrhoids. Patient not taking: Reported on 05/22/2018 01/19/18   Brunetta Genera, MD  LORazepam (ATIVAN) 0.5 MG tablet Take 1 tablet (0.5 mg total) by mouth every 6 (six) hours as needed (Nausea or vomiting). Patient not taking: Reported on 05/22/2018 01/19/18   Brunetta Genera, MD  prochlorperazine (COMPAZINE) 10 MG tablet Take 1 tablet (10 mg total) by mouth every 6 (six) hours as needed (Nausea  or vomiting). Patient not taking: Reported on 05/22/2018 01/19/18   Brunetta Genera, MD    Family History Family History  Problem Relation Age of Onset  . Breast cancer Mother   . Diabetes Father   . Hypertension Father   . Stroke Father   . Mental illness Sister   . Hypertension Daughter   . Mental illness Daughter   . Hypertension Brother   . Colon cancer Brother   . Breast cancer Sister     Social History Social History   Tobacco Use  . Smoking status: Current Every Day Smoker    Packs/day: 0.50    Years: 36.00    Pack years: 18.00    Types: Cigarettes  . Smokeless tobacco: Never Used  Substance Use Topics  . Alcohol use: Yes  Alcohol/week: 15.0 standard drinks    Types: 10 Cans of beer, 5 Shots of liquor per week    Comment: weekends  . Drug use: Yes    Types: Cocaine    Comment: reports cocaine usage ~2X/ month; last use 12/26/17     Allergies   Bee venom   Review of Systems Review of Systems  Constitutional: Negative for fever.  Eyes: Negative for visual disturbance.  Respiratory: Negative for cough and shortness of breath.   Cardiovascular: Negative for chest pain.  Gastrointestinal: Negative for abdominal pain, nausea and vomiting.  Genitourinary: Negative for dysuria and hematuria.  Neurological: Positive for dizziness and light-headedness. Negative for weakness, numbness and headaches.  All other systems reviewed and are negative.    Physical Exam Updated Vital Signs BP 120/83 (BP Location: Right Arm)   Pulse 68   Temp (!) 97.5 F (36.4 C) (Oral)   Resp 16   Ht 6' (1.829 m)   Wt 117.3 kg   SpO2 97%   BMI 35.06 kg/m   Physical Exam Vitals signs and nursing note reviewed.  Constitutional:      Appearance: Normal appearance. He is well-developed.  HENT:     Head: Normocephalic and atraumatic.  Eyes:     General: Lids are normal.     Extraocular Movements: Extraocular movements intact.     Conjunctiva/sclera: Conjunctivae  normal.     Pupils: Pupils are equal, round, and reactive to light.     Comments: EOMs intact.  No nystagmus.  PERRLA.  Neck:     Musculoskeletal: Full passive range of motion without pain.  Cardiovascular:     Rate and Rhythm: Normal rate and regular rhythm.     Pulses: Normal pulses.          Radial pulses are 2+ on the right side and 2+ on the left side.     Heart sounds: Normal heart sounds. No murmur. No friction rub. No gallop.   Pulmonary:     Effort: Pulmonary effort is normal.     Breath sounds: Normal breath sounds.  Abdominal:     Palpations: Abdomen is soft. Abdomen is not rigid.     Tenderness: There is no abdominal tenderness. There is no guarding.  Musculoskeletal: Normal range of motion.  Skin:    General: Skin is warm and dry.     Capillary Refill: Capillary refill takes less than 2 seconds.  Neurological:     Mental Status: He is alert and oriented to person, place, and time.     Comments: Cranial nerves III-XII intact Follows commands, Moves all extremities  5/5 strength to BUE and BLE  Sensation intact throughout all major nerve distributions Normal finger to nose. No dysdiadochokinesia. No pronator drift. No gait abnormalities  No slurred speech. No facial droop.  Negative Rhomberg.   Psychiatric:        Speech: Speech normal.      ED Treatments / Results  Labs (all labs ordered are listed, but only abnormal results are displayed) Labs Reviewed  BASIC METABOLIC PANEL - Abnormal; Notable for the following components:      Result Value   Glucose, Bld 155 (*)    All other components within normal limits  CBC - Abnormal; Notable for the following components:   RBC 3.60 (*)    Hemoglobin 10.6 (*)    HCT 33.4 (*)    RDW 19.4 (*)    nRBC 0.3 (*)    All other components within normal  limits  CBG MONITORING, ED - Abnormal; Notable for the following components:   Glucose-Capillary 162 (*)    All other components within normal limits  URINALYSIS,  ROUTINE W REFLEX MICROSCOPIC    EKG None  Radiology Ct Head Wo Contrast  Result Date: 05/27/2018 CLINICAL DATA:  Near syncope and dizziness. Large B-cell lymphoma EXAM: CT HEAD WITHOUT CONTRAST TECHNIQUE: Contiguous axial images were obtained from the base of the skull through the vertex without intravenous contrast. COMPARISON:  April 17, 2018 FINDINGS: Brain: The ventricles are normal in size and configuration. There is no intracranial mass, hemorrhage, extra-axial fluid collection, or midline shift. There is mild periventricular small vessel disease. Elsewhere the brain parenchyma appears unremarkable. There is no appreciable acute infarct. Vascular: There is no hyperdense vessel. No appreciable vascular calcification. Skull: The bony calvarium appears intact. Sinuses/Orbits: There is opacification in the posterior right maxillary antrum. There is slight mucosal thickening in several ethmoid air cells. Other visualized paranasal sinuses are clear. Orbits appear symmetric bilaterally. Other: Mastoid air cells are clear. There is debris in the right external auditory canal. IMPRESSION: Mild periventricular small vessel disease. No acute infarct evident. No mass or hemorrhage. There are areas of paranasal sinus disease. There is probable cerumen in the right external auditory canal. Electronically Signed   By: Lowella Grip III M.D.   On: 05/27/2018 13:25    Procedures Procedures (including critical care time)  Medications Ordered in ED Medications  sodium chloride flush (NS) 0.9 % injection 3 mL (has no administration in time range)     Initial Impression / Assessment and Plan / ED Course  I have reviewed the triage vital signs and the nursing notes.  Pertinent labs & imaging results that were available during my care of the patient were reviewed by me and considered in my medical decision making (see chart for details).     63 year old male past with history of non-Hodgkin's  lymphoma currently undergoing chemo who presents for evaluation of an episode of dizziness that occurred 45 minutes prior to arrival.  He states "felt like the room was going to spin and that he might pass out.." No associated nausea, vomiting, vision changes, chest pain, difficulty breathing.  He reports he has had these episodes similarly before but they only last few seconds.  Patient reports that he was concerned because it lasted so much longer, prompting EMS call.  On ED arrival, he reports that symptoms are completely resolved and that he feels back to baseline.  Patient asking to go home.  Patient currently denies any complaints at this time.  He is currently undergoing chemotherapy for his lymphoma.  His last session is this week. Patient is afebrile, non-toxic appearing, sitting comfortably on examination table. Vital signs reviewed and stable.  No neuro deficits on exam.  Normal gait with no signs of ataxia.  Negative Romberg.  We will plan to check basic labs, EKG.  Consider dehydration versus electrolyte imbalance versus orthostatic hypotension.  Also consider that muscle relaxer caused him to feel slightly off.  History/physical exam not concerning for ACS, CVA.  Discussed with Dr. Darl Householder.  Given patient's history of cancer, will plan for CT head for evaluation of any metastatic disease to the brain.  BMP is unremarkable.  UA negative for any signs of infection.  CBC shows no leukocytosis.  Hemoglobin is 10.6.  Review of records show that he is consistently anemic and this is not new.  Head CT shows no acute infarct, mass, hemorrhage.  There are areas of paranasal sinus disease.  Discussed results with patient including sinus findings.  Patient is asymptomatic at this time.  No indication to treat.  Patient reports feeling better.  He is ambulating in the ED without any signs of gait ataxia, symptoms.  Vital signs are stable. At this time, patient exhibits no emergent life-threatening condition that  require further evaluation in ED or admission. Patient had ample opportunity for questions and discussion. All patient's questions were answered with full understanding. Strict return precautions discussed. Patient expresses understanding and agreement to plan.   Portions of this note were generated with [employer]. Dictation errors may occur despite best attempts at proofreading.   Final Clinical Impressions(s) / ED Diagnoses   Final diagnoses:  Dizziness    ED Discharge Orders    None       Volanda Napoleon, PA-C 05/27/18 1701    Drenda Freeze, MD 05/29/18 [phone]

## 2018-05-27 NOTE — ED Triage Notes (Signed)
Per [facility]: Patient to ED c/o dizziness and near-syncope this morning. Patient is cancer patient and reports his last chemo treatment is tomorrow. EMS VS: 130/60, HR 80, RR 18, 99% RA. 20g. PIV L hand. Recently seen at [facility] for chest pain on 05/22/18 - denies pain at this time. Resp e/u, skin warm/dry.

## 2018-05-27 NOTE — ED Notes (Signed)
Patient provided with mask for transport to CT

## 2018-05-28 ENCOUNTER — Ambulatory Visit: Payer: 59 | Admitting: Hematology

## 2018-05-28 ENCOUNTER — Other Ambulatory Visit: Payer: 59

## 2018-05-28 ENCOUNTER — Inpatient Hospital Stay (HOSPITAL_COMMUNITY)
Admission: RE | Admit: 2018-05-28 | Discharge: 2018-06-01 | DRG: 847 | Disposition: A | Discharge status: Home / Self Care | Payer: 59 | Attending: Hematology | Admitting: Hematology

## 2018-05-28 ENCOUNTER — Encounter (HOSPITAL_COMMUNITY): Payer: Self-pay

## 2018-05-28 ENCOUNTER — Telehealth: Payer: Self-pay | Admitting: *Deleted

## 2018-05-28 ENCOUNTER — Other Ambulatory Visit: Payer: Self-pay

## 2018-05-28 DIAGNOSIS — Z9842 Cataract extraction status, left eye: Secondary | ICD-10-CM

## 2018-05-28 DIAGNOSIS — C8510 Unspecified B-cell lymphoma, unspecified site: Secondary | ICD-10-CM | POA: Diagnosis present

## 2018-05-28 DIAGNOSIS — C8338 Diffuse large B-cell lymphoma, lymph nodes of multiple sites: Secondary | ICD-10-CM

## 2018-05-28 DIAGNOSIS — Z5111 Encounter for antineoplastic chemotherapy: Secondary | ICD-10-CM | POA: Diagnosis not present

## 2018-05-28 DIAGNOSIS — Z87442 Personal history of urinary calculi: Secondary | ICD-10-CM

## 2018-05-28 DIAGNOSIS — C8330 Diffuse large B-cell lymphoma, unspecified site: Secondary | ICD-10-CM | POA: Diagnosis present

## 2018-05-28 DIAGNOSIS — F17210 Nicotine dependence, cigarettes, uncomplicated: Secondary | ICD-10-CM | POA: Diagnosis present

## 2018-05-28 DIAGNOSIS — C7951 Secondary malignant neoplasm of bone: Secondary | ICD-10-CM | POA: Diagnosis present

## 2018-05-28 DIAGNOSIS — Z91030 Bee allergy status: Secondary | ICD-10-CM

## 2018-05-28 DIAGNOSIS — D6481 Anemia due to antineoplastic chemotherapy: Secondary | ICD-10-CM | POA: Diagnosis present

## 2018-05-28 DIAGNOSIS — R590 Localized enlarged lymph nodes: Secondary | ICD-10-CM | POA: Diagnosis present

## 2018-05-28 DIAGNOSIS — I739 Peripheral vascular disease, unspecified: Secondary | ICD-10-CM | POA: Diagnosis present

## 2018-05-28 DIAGNOSIS — Z961 Presence of intraocular lens: Secondary | ICD-10-CM | POA: Diagnosis present

## 2018-05-28 DIAGNOSIS — Z9841 Cataract extraction status, right eye: Secondary | ICD-10-CM

## 2018-05-28 DIAGNOSIS — H5350 Unspecified color vision deficiencies: Secondary | ICD-10-CM | POA: Diagnosis present

## 2018-05-28 DIAGNOSIS — I10 Essential (primary) hypertension: Secondary | ICD-10-CM | POA: Diagnosis present

## 2018-05-28 DIAGNOSIS — E785 Hyperlipidemia, unspecified: Secondary | ICD-10-CM | POA: Diagnosis present

## 2018-05-28 DIAGNOSIS — N400 Enlarged prostate without lower urinary tract symptoms: Secondary | ICD-10-CM | POA: Diagnosis present

## 2018-05-28 DIAGNOSIS — Z8249 Family history of ischemic heart disease and other diseases of the circulatory system: Secondary | ICD-10-CM | POA: Diagnosis not present

## 2018-05-28 DIAGNOSIS — T451X5A Adverse effect of antineoplastic and immunosuppressive drugs, initial encounter: Secondary | ICD-10-CM | POA: Diagnosis present

## 2018-05-28 DIAGNOSIS — Z7189 Other specified counseling: Secondary | ICD-10-CM

## 2018-05-28 LAB — COMPREHENSIVE METABOLIC PANEL
ALT: 19 U/L (ref 0–44)
AST: 23 U/L (ref 15–41)
Albumin: 4.3 g/dL (ref 3.5–5.0)
Alkaline Phosphatase: 72 U/L (ref 38–126)
Anion gap: 9 (ref 5–15)
BUN: 16 mg/dL (ref 8–23)
CO2: 24 mmol/L (ref 22–32)
Chloride: 108 mmol/L (ref 98–111)
Creatinine, Ser: 1 mg/dL (ref 0.61–1.24)
GFR calc Af Amer: >60 mL/min (ref >60)
Glucose, Bld: 198 mg/dL — ABNORMAL HIGH (ref 70–99)
Potassium: 3.6 mmol/L (ref 3.5–5.1)
Sodium: 141 mmol/L (ref 135–145)
Total Bilirubin: 0.6 mg/dL (ref 0.3–1.2)
Total Protein: 7 g/dL (ref 6.5–8.1)

## 2018-05-28 LAB — CBC WITH DIFFERENTIAL/PLATELET
Abs Immature Granulocytes: 0.12 10*3/uL — ABNORMAL HIGH (ref 0.00–0.07)
Basophils Absolute: 0.1 10*3/uL (ref 0.0–0.1)
Basophils Relative: 1 %
Eosinophils Absolute: 0 10*3/uL (ref 0.0–0.5)
Eosinophils Relative: 1 %
HCT: 34.6 % — ABNORMAL LOW (ref 39.0–52.0)
Hemoglobin: 10.6 g/dL — ABNORMAL LOW (ref 13.0–17.0)
Immature Granulocytes: 2 %
Lymphocytes Relative: 26 %
Lymphs Abs: 1.6 10*3/uL (ref 0.7–4.0)
MCH: 28.8 pg (ref 26.0–34.0)
MCHC: 30.6 g/dL (ref 30.0–36.0)
MCV: 94 fL (ref 80.0–100.0)
Monocytes Absolute: 0.9 10*3/uL (ref 0.1–1.0)
Monocytes Relative: 14 %
Neutro Abs: 3.7 10*3/uL (ref 1.7–7.7)
Neutrophils Relative %: 56 %
Platelets: 281 10*3/uL (ref 150–400)
RBC: 3.68 MIL/uL — ABNORMAL LOW (ref 4.22–5.81)
RDW: 19.4 % — ABNORMAL HIGH (ref 11.5–15.5)
WBC: 6.4 10*3/uL (ref 4.0–10.5)
nRBC: 0 % (ref 0.0–0.2)

## 2018-05-28 LAB — RETICULOCYTES
Immature Retic Fract: 24.5 % — ABNORMAL HIGH (ref 2.3–15.9)
RBC.: 3.68 MIL/uL — ABNORMAL LOW (ref 4.22–5.81)
Retic Count, Absolute: 108.2 10*3/uL (ref 19.0–186.0)
Retic Ct Pct: 2.9 % (ref 0.4–3.1)

## 2018-05-28 LAB — MAGNESIUM: Magnesium: 2 mg/dL (ref 1.7–2.4)

## 2018-05-28 LAB — COMPREHENSIVE METABOLIC PANEL WITH GFR
Calcium: 9.2 mg/dL (ref 8.9–10.3)
GFR calc non Af Amer: >60 mL/min (ref >60)

## 2018-05-28 MED ORDER — ASPIRIN EC 81 MG PO TBEC
81.0000 mg | DELAYED_RELEASE_TABLET | Freq: Every day | ORAL | Status: DC
  Administered 2018-05-29 – 2018-06-01 (×4): 81 mg via ORAL
  Filled 2018-05-28 (×5): qty 1

## 2018-05-28 MED ORDER — LISINOPRIL 20 MG PO TABS
20.0000 mg | ORAL_TABLET | Freq: Every day | ORAL | Status: DC
  Administered 2018-05-29 – 2018-06-01 (×4): 20 mg via ORAL
  Filled 2018-05-28 (×5): qty 1

## 2018-05-28 MED ORDER — HOT PACK MISC ONCOLOGY
1.0000 | Freq: Once | Status: DC | PRN
  Filled 2018-05-28: qty 1

## 2018-05-28 MED ORDER — ENOXAPARIN SODIUM 40 MG/0.4ML ~~LOC~~ SOLN
40.0000 mg | SUBCUTANEOUS | Status: DC
  Administered 2018-05-28 – 2018-05-31 (×4): 40 mg via SUBCUTANEOUS
  Filled 2018-05-28 (×3): qty 0.4

## 2018-05-28 MED ORDER — LORAZEPAM 0.5 MG PO TABS: 0.5000 mg | ORAL_TABLET | Freq: Four times a day (QID) | ORAL | Status: DC | PRN

## 2018-05-28 MED ORDER — PROCHLORPERAZINE MALEATE 10 MG PO TABS: 10.0000 mg | ORAL_TABLET | Freq: Four times a day (QID) | ORAL | Status: DC | PRN

## 2018-05-28 MED ORDER — AMLODIPINE BESYLATE 10 MG PO TABS
10.0000 mg | ORAL_TABLET | Freq: Every day | ORAL | Status: DC
  Administered 2018-05-29 – 2018-06-01 (×4): 10 mg via ORAL
  Filled 2018-05-28 (×5): qty 1

## 2018-05-28 MED ORDER — TAMSULOSIN HCL 0.4 MG PO CAPS
0.4000 mg | ORAL_CAPSULE | Freq: Every day | ORAL | Status: DC
  Administered 2018-05-28 – 2018-05-31 (×4): 0.4 mg via ORAL
  Filled 2018-05-28 (×4): qty 1

## 2018-05-28 MED ORDER — VINCRISTINE SULFATE CHEMO INJECTION 1 MG/ML
Freq: Once | INTRAVENOUS | Status: AC
  Administered 2018-05-28: 17:00:00 via INTRAVENOUS
  Filled 2018-05-28: qty 13

## 2018-05-28 MED ORDER — ACETAMINOPHEN 325 MG PO TABS: 650.0000 mg | ORAL_TABLET | ORAL | Status: DC | PRN

## 2018-05-28 MED ORDER — PREDNISONE 20 MG PO TABS
60.0000 mg | ORAL_TABLET | Freq: Every day | ORAL | Status: AC
  Administered 2018-05-28 – 2018-06-01 (×5): 60 mg via ORAL
  Filled 2018-05-28 (×5): qty 3

## 2018-05-28 MED ORDER — SODIUM CHLORIDE 0.9 % IV SOLN
Freq: Once | INTRAVENOUS | Status: AC
  Administered 2018-05-28: 8 mg via INTRAVENOUS
  Filled 2018-05-28: qty 4

## 2018-05-28 MED ORDER — HYDROCORTISONE 2.5 % RE CREA: 1.0000 "application " | TOPICAL_CREAM | Freq: Two times a day (BID) | RECTAL | Status: DC | PRN

## 2018-05-28 MED ORDER — SODIUM CHLORIDE 0.9 % IV SOLN
INTRAVENOUS | Status: DC
  Administered 2018-05-28: 17:00:00 via INTRAVENOUS

## 2018-05-28 MED ORDER — COLD PACK MISC ONCOLOGY
1.0000 | Freq: Once | Status: DC | PRN
  Filled 2018-05-28: qty 1

## 2018-05-28 NOTE — H&P (Signed)
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 male who has been admitted today for C6 EPOCH-R treatment of his T-Cell/Histocyte Rich Large B-Cell Lymphoma. Jesus [name] is accompanied today by his sister and brother-in-law at bedside. [name].   The pt reports that he did become dizzy yesterday for about 10 minutes which concerned him enough to present to the ED. He denies the characterization of the room spinning. He notes that this occurred after he took methocarbamol. His ED work up was otherwise unremarkable. The pt notes that his dizziness has since resolved.   The pt denies any leg swelling, and notes that he has been eating well and sleeping well. He endorses good energy levels and denies abdominal pains. He also denies any concerns for infections.   Lab results today (05/28/18) of CBC w/diff and CMP is as follows: all values are WNL except for RBC at 3.68, HGB at 10.6, HCT at 34.6, RDW at 19.4, Abs immature granulocytes at 0.12k, Glucose at 198.  On review of systems, pt reports eating well, sleeping well, good energy levels, and denies dizziness, leg swelling, abdominal pains, blood in the stools, and any other symptoms.   MEDICAL HISTORY:  Past Medical History:  Diagnosis Date  . Allergy   . History of kidney stones   . Hyperlipidemia   . Hypertension   . Lymphadenopathy   . Pain, lower leg    Bilateral  . Peripheral arterial disease ([place])   . Pre-diabetes   . Red-green color blindness   . Snores   . Wears glasses     SURGICAL HISTORY: Past Surgical History:  Procedure Laterality Date  . CATARACT EXTRACTION W/ INTRAOCULAR LENS  IMPLANT, BILATERAL     . COLONOSCOPY    . dislodged salava stone    . FRACTURE SURGERY    . HAND ARTHROPLASTY  1995   crushed left hand  . INGUINAL LYMPH NODE BIOPSY Left 01/02/2018   Procedure: LEFT INGUINAL LYMPH NODE BIOPSY;  Surgeon: Rolm Bookbinder, MD;  Location: [facility];  Service: General;  Laterality: Left;  . IR IMAGING GUIDED PORT INSERTION  01/15/2018  . MICROLARYNGOSCOPY Left 01/17/2014   Procedure: MICROLARYNGOSCOPY WITH EXCISION OF THE BIOPSY OF LEFT VOCAL CORD LESION;  Surgeon: Izora Gala, MD;  Location: [facility];  Service: ENT;  Laterality: Left;  . ORIF FOOT FRACTURE  2005   left    SOCIAL HISTORY: Social History   Socioeconomic History  . Marital status: Divorced    Spouse name: Not on file  . Number of children: 3  . Years of education: Not on file  . Highest education level: Not on file  Occupational History  . Not on file  Social Needs  . Financial resource strain: Not on file  . Food insecurity:    Worry: Not on file    Inability: Not on file  . Transportation needs:    Medical: Not on file    Non-medical: Not on file  Tobacco Use  . Smoking status: Current Every Day Smoker    Packs/day: 0.50    Years: 36.00    Pack years: 18.00    Types: Cigarettes  . Smokeless tobacco: Never  Used  Substance and Sexual Activity  . Alcohol use: Yes    Alcohol/week: 15.0 standard drinks    Types: 10 Cans of beer, 5 Shots of liquor per week    Comment: weekends  . Drug use: Yes    Types: Cocaine    Comment: reports cocaine usage ~2X/ month; last use 12/26/17  . Sexual activity: Not on file  Lifestyle  . Physical activity:    Days per week: Not on file    Minutes per session: Not on file  . Stress: Not on file  Relationships  . Social connections:    Talks on phone: Not on file    Gets together: Not on file    Attends religious service: Not on file    Active member of club or organization: Not on file    Attends meetings of clubs or organizations: Not on file     Relationship status: Not on file  . Intimate partner violence:    Fear of current or ex partner: Not on file    Emotionally abused: Not on file    Physically abused: Not on file    Forced sexual activity: Not on file  Other Topics Concern  . Not on file  Social History Narrative  . Not on file    FAMILY HISTORY: Family History  Problem Relation Age of Onset  . Breast cancer Mother   . Diabetes Father   . Hypertension Father   . Stroke Father   . Mental illness Sister   . Hypertension Daughter   . Mental illness Daughter   . Hypertension Brother   . Colon cancer Brother   . Breast cancer Sister     ALLERGIES:  is allergic to bee venom.  MEDICATIONS:  Current Facility-Administered Medications  Medication Dose Route Frequency Provider Last Rate Last Dose  . 0.9 %  sodium chloride infusion   Intravenous Continuous Brunetta Genera, MD      . acetaminophen (TYLENOL) tablet 650 mg  650 mg Oral Q4H PRN Brunetta Genera, MD      . amLODipine (NORVASC) tablet 10 mg  10 mg Oral Daily Brunetta Genera, MD      . aspirin EC tablet 81 mg  81 mg Oral Daily Brunetta Genera, MD      . Cold Pack 1 packet  1 packet Topical Once PRN Brunetta Genera, MD      . DOXOrubicin (ADRIAMYCIN) 26 mg, etoposide (VEPESID) 132 mg, vinCRIStine (ONCOVIN) 1 mg in sodium chloride 0.9 % 1,000 mL chemo infusion   Intravenous Once Brunetta Genera, MD      . enoxaparin (LOVENOX) injection 40 mg  40 mg Subcutaneous Q24H Brunetta Genera, MD   40 mg at 05/28/18 1354  . Hot Pack 1 packet  1 packet Topical Once PRN Brunetta Genera, MD      . hydrocortisone (ANUSOL-HC) 2.5 % rectal cream 1 application  1 application Rectal BID PRN Brunetta Genera, MD      . lisinopril (PRINIVIL,ZESTRIL) tablet 20 mg  20 mg Oral Daily Brunetta Genera, MD      . LORazepam (ATIVAN) tablet 0.5 mg  0.5 mg Oral Q6H PRN Brunetta Genera, MD      . ondansetron [facility]) 8 mg, dexamethasone  (DECADRON) 10 mg in sodium chloride 0.9 % 50 mL IVPB   Intravenous Once Brunetta Genera, MD      . predniSONE (DELTASONE) tablet 60 mg  60 mg  Oral QAC breakfast Brunetta Genera, MD      . prochlorperazine (COMPAZINE) tablet 10 mg  10 mg Oral Q6H PRN Brunetta Genera, MD      . tamsulosin (FLOMAX) capsule 0.4 mg  0.4 mg Oral QPC supper Brunetta Genera, MD        REVIEW OF SYSTEMS:    A 10+ POINT REVIEW OF SYSTEMS WAS OBTAINED including neurology, dermatology, psychiatry, cardiac, respiratory, lymph, extremities, GI, GU, Musculoskeletal, constitutional, breasts, reproductive, HEENT.  All pertinent positives are noted in the HPI.  All others are negative.   PHYSICAL EXAMINATION: ECOG PERFORMANCE STATUS: 0 - Asymptomatic  . Vitals:   05/28/18 1213 05/28/18 1447  BP: 136/71 134/82  Pulse: (!) 59 92  Resp: 17 18  Temp: 97.9 F (36.6 C) 98.2 F (36.8 C)  SpO2: 100% 99%   Filed Weights   05/28/18 1548  Weight: 258 lb 8 oz (117.3 kg)   .Body mass index is 35.06 kg/m.  GENERAL:alert, in no acute distress and comfortable SKIN: no acute rashes, no significant lesions EYES: conjunctiva are pink and non-injected, sclera anicteric OROPHARYNX: MMM, no exudates, no oropharyngeal erythema or ulceration NECK: supple, no JVD LYMPH:  no palpable lymphadenopathy in the cervical, axillary or inguinal regions LUNGS: clear to auscultation b/l with normal respiratory effort HEART: regular rate & rhythm ABDOMEN:  normoactive bowel sounds , non tender, not distended. No palpable hepatosplenomegaly.  Extremity: no pedal edema PSYCH: alert & oriented x 3 with fluent speech NEURO: no focal motor/sensory deficits   LABORATORY DATA:  I have reviewed the data as listed  . CBC Latest Ref Rng & Units 05/28/2018 05/27/2018 05/24/2018  WBC 4.0 - 10.5 K/uL 6.4 7.8 10.1  Hemoglobin 13.0 - 17.0 g/dL 10.6(L) 10.6(L) 10.3(L)  Hematocrit 39.0 - 52.0 % 34.6(L) 33.4(L) 31.9(L)  Platelets 150 -  400 K/uL 281 222 131(L)    . CMP Latest Ref Rng & Units 05/28/2018 05/27/2018 05/24/2018  Glucose 70 - 99 mg/dL 198(H) 155(H) 163(H)  BUN 8 - 23 mg/dL 16 9 11   Creatinine 0.61 - 1.24 mg/dL 1.00 0.98 1.10  Sodium 135 - 145 mmol/L 141 140 143  Potassium 3.5 - 5.1 mmol/L 3.6 3.8 3.7  Chloride 98 - 111 mmol/L 108 109 108  CO2 22 - 32 mmol/L 24 22 26   Calcium 8.9 - 10.3 mg/dL 9.2 9.0 9.8  Total Protein 6.5 - 8.1 g/dL 7.0 - 6.9  Total Bilirubin 0.3 - 1.2 mg/dL 0.6 - 0.3  Alkaline Phos 38 - 126 U/L 72 - 86  AST 15 - 41 U/L 23 - 18  ALT 0 - 44 U/L 19 - 16     RADIOGRAPHIC STUDIES: I have personally reviewed the radiological images as listed and agreed with the findings in the report. Dg Chest 2 View  Result Date: 05/22/2018 CLINICAL DATA:  Low back pain, chest pain EXAM: CHEST - 2 VIEW COMPARISON:  04/16/2018 FINDINGS: There is a right-sided Port-A-Cath with the tip projecting over the SVC. There is no focal parenchymal opacity. There is no pleural effusion or pneumothorax. The heart size is top-normal. The osseous structures are unremarkable. IMPRESSION: No active cardiopulmonary disease. Electronically Signed   By: Kathreen Devoid   On: 05/22/2018 12:04   Ct Head Wo Contrast  Result Date: 05/27/2018 CLINICAL DATA:  Near syncope and dizziness. Large B-cell lymphoma EXAM: CT HEAD WITHOUT CONTRAST TECHNIQUE: Contiguous axial images were obtained from the base of the skull through the vertex without intravenous contrast.  COMPARISON:  April 17, 2018 FINDINGS: Brain: The ventricles are normal in size and configuration. There is no intracranial mass, hemorrhage, extra-axial fluid collection, or midline shift. There is mild periventricular small vessel disease. Elsewhere the brain parenchyma appears unremarkable. There is no appreciable acute infarct. Vascular: There is no hyperdense vessel. No appreciable vascular calcification. Skull: The bony calvarium appears intact. Sinuses/Orbits: There is  opacification in the posterior right maxillary antrum. There is slight mucosal thickening in several ethmoid air cells. Other visualized paranasal sinuses are clear. Orbits appear symmetric bilaterally. Other: Mastoid air cells are clear. There is debris in the right external auditory canal. IMPRESSION: Mild periventricular small vessel disease. No acute infarct evident. No mass or hemorrhage. There are areas of paranasal sinus disease. There is probable cerumen in the right external auditory canal. Electronically Signed   By: Lowella Grip III M.D.   On: 05/27/2018 13:25   Ct Angio Chest Pe W/cm &/or Wo Cm  Result Date: 05/22/2018 CLINICAL DATA:  Onset chest pain last night which has since resolved. History of lymphoma. EXAM: CT ANGIOGRAPHY CHEST WITH CONTRAST TECHNIQUE: Multidetector CT imaging of the chest was performed using the standard protocol during bolus administration of intravenous contrast. Multiplanar CT image reconstructions and MIPs were obtained to evaluate the vascular anatomy. CONTRAST:  100 mL ISOVUE-370 IOPAMIDOL (ISOVUE-370) INJECTION 76% COMPARISON:  PA and lateral chest earlier today. PET CT scan 03/14/2018. FINDINGS: Cardiovascular: No pulmonary embolus is identified. Heart size is upper normal. No pericardial effusion. No atherosclerosis. Port-A-Cath noted. Mediastinum/Nodes: No enlarged mediastinal, hilar, or axillary lymph nodes. Thyroid gland, trachea, and esophagus demonstrate no significant findings. Lungs/Pleura: No pleural effusion. Lungs demonstrate only mild dependent atelectasis. Upper Abdomen: There is partial visualization of cysts in kidneys which are unchanged. Imaged intra-abdominal contents are otherwise unremarkable. Musculoskeletal: No acute or focal abnormality. Review of the MIP images confirms the above findings. IMPRESSION: Negative for pulmonary embolus.  No acute disease. Electronically Signed   By: Inge Rise M.D.   On: 05/22/2018 14:53    ASSESSMENT  & PLAN:  63 y.o. 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  CT of the abdomen and pelvis performed on 12/22/2017 showed bulky left inguinal, left hemipelvic, and retroperitoneal adenopathy.   01/02/18 Left inguinal LN Biopsy revealed T-Cell/histocyte rich Large B-Cell Lymphoma  12/27/17 ECHO revealed LV EF of 55-60%   01/05/18 PET/CT revealedMassively enlarged pelvic lymph nodes intense metabolic activity consistent lymphoma. 2. Additional hypermetabolic lymph nodes in the porta hepatis and retroperitoneum LEFT aorta. 3. Solitary hypermetabolic mediastinal lymph node in the upper LEFT Mediastinum. 4. 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 5. Normal spleen.  HIV non reactive on 12/22/2017.Hep C and hep B serology negative.  03/14/18 PET/CT revealedPET-CT findings suggest an excellent response to chemotherapy. The abdominal lymphadenopathy has near completely resolved and demonstrates a near complete metabolic response. The pelvic and inguinal adenopathy has significantly decreased in size and the metabolic activity has significantly decreased. 2. Diffuse marrow activity likely due to chemotherapy and or marrow stimulating drugs. I do not see any discrete persistent lesions.   04/17/18 CT Head revealedSubtle mesial caudothalamic hypodensities may be artifact though, the could reflect encephalitis or Wernicke's encephalopathy. Consider MRI of the head with and without contrast. 2. Mild chronic small vessel ischemic changes  2) left lower extremity swelling- now resolved Doppler ultrasound for DVT  was negative in the left lower extremity.  Likely from venous compression +/- lymphatic obstruction from bulky left inguinal, left  hemipelvic, and retroperitoneal adenopathy.   3) S/p Port a cath placement   4)h/oE.coli UTI - Pansensitive -Resolved.Also appearedto have BPH like symptomatology. -Was hospitalized 04/16/18 to 04/20/18 for E.coli sepsis from UTI, treated with antibiotics.  5)s/pE.COLi sepsis - likely from urinary source. Recent h/o E.coli UTI  6) s/p Symptomatic Anemia Hgb 6.7 - due to chemotherapy/sepsis- s/p PRBC transfusion. No overt evidence of bleeding. hgb now stable at 10.6  7) DVT prophylaxis -lovenoxSC in the hospital  8) HTN-was elevated in setting of improved po intake and steroids -improving control -conitnueon lisinopril 20 mg p.o. daily today -Continue Flomax for his BPH. -increasedamlodipine to 10 mgTo optimize BP control -will monitor and optimize rx accordingly.  9) Likely BPH - on flomax  PLAN: -Discussed pt labwork today, 05/28/18; blood counts and chemistries are stable  -The pt has no prohibitive toxicities from continuing C6 EPOCH-R at this time.   -chemotherapy orders placed, reviewed and discussed with pharmacy. -Continue eating well, staying hydrated, and staying as active as reasonably possible  -Outpatient Rituxan and Neulasta on 06/04/18 -Will complete PET/CT for end of treatment efficacy evaluation  -transfuse prn for hgb<8- hgb stable at 10.6 at this time. -will restart Bactrim for UTI prophylaxis on discharge. -Lovenox for DVT prophylaxis  All of the patients questions were answered with apparent satisfaction.    Sullivan Lone MD MS AAHIVMS [facility] Hematology/Oncology Physician [facility]  (Office):       [phone] (Work cell):  [phone] (Fax):           [phone]  05/28/2018 4:10 PM  I, Baldwin [place], am acting as a scribe for Dr. Sullivan Lone.   .I have reviewed the above documentation for accuracy and completeness, and I agree with the above. Sullivan Lone MD MS

## 2018-05-28 NOTE — Telephone Encounter (Signed)
Contacted bed placement [facility]) for admission to [facility] for inpatient chemo. Per Arrie Aran, 6E will take patient today as soon as room is available. They will notify her and she will contact patient this morning. Sent email to #inpatient chemotherapy with patient information.

## 2018-05-29 LAB — COMPREHENSIVE METABOLIC PANEL
ALT: 19 U/L (ref 0–44)
AST: 19 U/L (ref 15–41)
Albumin: 3.8 g/dL (ref 3.5–5.0)
Alkaline Phosphatase: 62 U/L (ref 38–126)
Anion gap: 8 (ref 5–15)
BUN: 18 mg/dL (ref 8–23)
CO2: 23 mmol/L (ref 22–32)
Calcium: 8.9 mg/dL (ref 8.9–10.3)
Creatinine, Ser: 0.79 mg/dL (ref 0.61–1.24)
GFR calc Af Amer: >60 mL/min (ref >60)
GFR calc non Af Amer: >60 mL/min (ref >60)
Glucose, Bld: 275 mg/dL — ABNORMAL HIGH (ref 70–99)
Potassium: 4.4 mmol/L (ref 3.5–5.1)
Sodium: 135 mmol/L (ref 135–145)
Total Bilirubin: 0.4 mg/dL (ref 0.3–1.2)
Total Protein: 6.5 g/dL (ref 6.5–8.1)

## 2018-05-29 LAB — CBC
HCT: 31.8 % — ABNORMAL LOW (ref 39.0–52.0)
Hemoglobin: 9.9 g/dL — ABNORMAL LOW (ref 13.0–17.0)
MCH: 29.6 pg (ref 26.0–34.0)
MCHC: 31.1 g/dL (ref 30.0–36.0)
MCV: 94.9 fL (ref 80.0–100.0)
Platelets: 263 10*3/uL (ref 150–400)
RBC: 3.35 MIL/uL — ABNORMAL LOW (ref 4.22–5.81)
RDW: 18.6 % — ABNORMAL HIGH (ref 11.5–15.5)
WBC: 8.4 10*3/uL (ref 4.0–10.5)
nRBC: 0 % (ref 0.0–0.2)

## 2018-05-29 LAB — COMPREHENSIVE METABOLIC PANEL WITH GFR: Chloride: 104 mmol/L (ref 98–111)

## 2018-05-29 MED ORDER — VINCRISTINE SULFATE CHEMO INJECTION 1 MG/ML
Freq: Once | INTRAVENOUS | Status: AC
  Administered 2018-05-29: 15:00:00 via INTRAVENOUS
  Filled 2018-05-29: qty 13

## 2018-05-29 MED ORDER — SODIUM CHLORIDE 0.9 % IV SOLN
Freq: Once | INTRAVENOUS | Status: AC
  Administered 2018-05-29: 18 mg via INTRAVENOUS
  Filled 2018-05-29: qty 4

## 2018-05-29 NOTE — Progress Notes (Signed)
HEMATOLOGY/ONCOLOGY INPATIENT PROGRESS NOTE  Date of Service: 05/29/2018  Inpatient Attending: .Brunetta Genera, MD   SUBJECTIVE:   Jesus [name] reports that he is doing well overall today.  The pt reports that he has been eating well and continues to endorse a good appetite. He denies developing any concerns since yesterday. He is urinating well and denies problems moving his bowels. He is enjoying good energy levels. The pt also notes that the tingling in his fingertips has improved.   Lab results today (05/29/18) of CBC and CMP is as follows: all values are WNL except for RBC at 3.35, HGB at 9.9, HCT at 31.8, RDW at 18.6, Glucose at 275.  On review of systems, pt reports good energy levels, good appetite, eating well, urinating well, moving his bowels well, and denies abdominal pains, leg swelling, nausea, and any other symptoms.   OBJECTIVE:  NAD  PHYSICAL EXAMINATION: . Vitals:   05/29/18 1321 05/29/18 2101 05/30/18 0553 05/30/18 1106  BP: 126/71 137/78 (!) 131/92 137/82  Pulse: 83 83  65  Resp: 18 16 18 16   Temp: 97.7 F (36.5 C) 97.8 F (36.6 C) 97.7 F (36.5 C) 98.1 F (36.7 C)  TempSrc: Oral Oral Oral Oral  SpO2: 95% 99%  100%  Weight:      Height:       Filed Weights   05/28/18 1548  Weight: 258 lb 8 oz (117.3 kg)   .Body mass index is 35.06 kg/m.  GENERAL:alert, in no acute distress and comfortable SKIN: skin color, texture, turgor are normal, no rashes or significant lesions EYES: normal, conjunctiva are pink and non-injected, sclera clear OROPHARYNX:no exudate, no erythema and lips, buccal mucosa, and tongue normal  NECK: supple, no JVD, thyroid normal size, non-tender, without nodularity LYMPH:  no palpable lymphadenopathy in the cervical, axillary or inguinal LUNGS: clear to auscultation with normal respiratory effort HEART: regular rate & rhythm,  no murmurs and no lower extremity edema ABDOMEN: abdomen soft, non-tender, normoactive bowel  sounds  Musculoskeletal: no cyanosis of digits and no clubbing  PSYCH: alert & oriented x 3 with fluent speech NEURO: no focal motor/sensory deficits  MEDICAL HISTORY:  Past Medical History:  Diagnosis Date  . Allergy   . History of kidney stones   . Hyperlipidemia   . Hypertension   . Lymphadenopathy   . Pain, lower leg    Bilateral  . Peripheral arterial disease ([place])   . Pre-diabetes   . Red-green color blindness   . Snores   . Wears glasses     SURGICAL HISTORY: Past Surgical History:  Procedure Laterality Date  . CATARACT EXTRACTION W/ INTRAOCULAR LENS  IMPLANT, BILATERAL    . COLONOSCOPY    . dislodged salava stone    . FRACTURE SURGERY    . HAND ARTHROPLASTY  1995   crushed left hand  . INGUINAL LYMPH NODE BIOPSY Left 01/02/2018   Procedure: LEFT INGUINAL LYMPH NODE BIOPSY;  Surgeon: Rolm Bookbinder, MD;  Location: [place];  Service: General;  Laterality: Left;  . IR IMAGING GUIDED PORT INSERTION  01/15/2018  . MICROLARYNGOSCOPY Left 01/17/2014   Procedure: MICROLARYNGOSCOPY WITH EXCISION OF THE BIOPSY OF LEFT VOCAL CORD LESION;  Surgeon: Izora Gala, MD;  Location: [facility];  Service: ENT;  Laterality: Left;  . ORIF FOOT FRACTURE  2005   left    SOCIAL HISTORY: Social History   Socioeconomic History  . Marital status: Divorced    Spouse name: Not on  file  . Number of children: 3  . Years of education: Not on file  . Highest education level: Not on file  Occupational History  . Not on file  Social Needs  . Financial resource strain: Not on file  . Food insecurity:    Worry: Not on file    Inability: Not on file  . Transportation needs:    Medical: Not on file    Non-medical: Not on file  Tobacco Use  . Smoking status: Current Every Day Smoker    Packs/day: 0.50    Years: 36.00    Pack years: 18.00    Types: Cigarettes  . Smokeless tobacco: Never Used  Substance and Sexual Activity  . Alcohol use: Yes    Alcohol/week: 15.0  standard drinks    Types: 10 Cans of beer, 5 Shots of liquor per week    Comment: weekends  . Drug use: Yes    Types: Cocaine    Comment: reports cocaine usage ~2X/ month; last use 12/26/17  . Sexual activity: Not on file  Lifestyle  . Physical activity:    Days per week: Not on file    Minutes per session: Not on file  . Stress: Not on file  Relationships  . Social connections:    Talks on phone: Not on file    Gets together: Not on file    Attends religious service: Not on file    Active member of club or organization: Not on file    Attends meetings of clubs or organizations: Not on file    Relationship status: Not on file  . Intimate partner violence:    Fear of current or ex partner: Not on file    Emotionally abused: Not on file    Physically abused: Not on file    Forced sexual activity: Not on file  Other Topics Concern  . Not on file  Social History Narrative  . Not on file    FAMILY HISTORY: Family History  Problem Relation Age of Onset  . Breast cancer Mother   . Diabetes Father   . Hypertension Father   . Stroke Father   . Mental illness Sister   . Hypertension Daughter   . Mental illness Daughter   . Hypertension Brother   . Colon cancer Brother   . Breast cancer Sister     ALLERGIES:  is allergic to bee venom.  MEDICATIONS:  Scheduled Meds: . amLODipine  10 mg Oral Daily  . aspirin EC  81 mg Oral Daily  . DOXOrubicin/vinCRIStine/etoposide CHEMO IV infusion for Inpatient CI   Intravenous Once  . enoxaparin (LOVENOX) injection  40 mg Subcutaneous Q24H  . lisinopril  20 mg Oral Daily  . predniSONE  60 mg Oral QAC breakfast  . tamsulosin  0.4 mg Oral QPC supper   Continuous Infusions: . sodium chloride 10 mL/hr at 05/29/18 0600   PRN Meds:.acetaminophen, Cold Pack, Hot Pack, hydrocortisone, LORazepam, prochlorperazine  REVIEW OF SYSTEMS:    10 Point review of Systems was done is negative except as noted above.   LABORATORY DATA:  I have  reviewed the data as listed  . CBC Latest Ref Rng & Units 05/29/2018 05/28/2018 05/27/2018  WBC 4.0 - 10.5 K/uL 8.4 6.4 7.8  Hemoglobin 13.0 - 17.0 g/dL 9.9(L) 10.6(L) 10.6(L)  Hematocrit 39.0 - 52.0 % 31.8(L) 34.6(L) 33.4(L)  Platelets 150 - 400 K/uL 263 281 222    . CMP Latest Ref Rng & Units 05/29/2018 05/28/2018 05/27/2018  Glucose 70 - 99 mg/dL 275(H) 198(H) 155(H)  BUN 8 - 23 mg/dL 18 16 9   Creatinine 0.61 - 1.24 mg/dL 0.79 1.00 0.98  Sodium 135 - 145 mmol/L 135 141 140  Potassium 3.5 - 5.1 mmol/L 4.4 3.6 3.8  Chloride 98 - 111 mmol/L 104 108 109  CO2 22 - 32 mmol/L 23 24 22   Calcium 8.9 - 10.3 mg/dL 8.9 9.2 9.0  Total Protein 6.5 - 8.1 g/dL 6.5 7.0 -  Total Bilirubin 0.3 - 1.2 mg/dL 0.4 0.6 -  Alkaline Phos 38 - 126 U/L 62 72 -  AST 15 - 41 U/L 19 23 -  ALT 0 - 44 U/L 19 19 -     RADIOGRAPHIC STUDIES: I have personally reviewed the radiological images as listed and agreed with the findings in the report. Dg Chest 2 View  Result Date: 05/22/2018 CLINICAL DATA:  Low back pain, chest pain EXAM: CHEST - 2 VIEW COMPARISON:  04/16/2018 FINDINGS: There is a right-sided Port-A-Cath with the tip projecting over the SVC. There is no focal parenchymal opacity. There is no pleural effusion or pneumothorax. The heart size is top-normal. The osseous structures are unremarkable. IMPRESSION: No active cardiopulmonary disease. Electronically Signed   By: Kathreen Devoid   On: 05/22/2018 12:04   Ct Head Wo Contrast  Result Date: 05/27/2018 CLINICAL DATA:  Near syncope and dizziness. Large B-cell lymphoma EXAM: CT HEAD WITHOUT CONTRAST TECHNIQUE: Contiguous axial images were obtained from the base of the skull through the vertex without intravenous contrast. COMPARISON:  April 17, 2018 FINDINGS: Brain: The ventricles are normal in size and configuration. There is no intracranial mass, hemorrhage, extra-axial fluid collection, or midline shift. There is mild periventricular small vessel disease.  Elsewhere the brain parenchyma appears unremarkable. There is no appreciable acute infarct. Vascular: There is no hyperdense vessel. No appreciable vascular calcification. Skull: The bony calvarium appears intact. Sinuses/Orbits: There is opacification in the posterior right maxillary antrum. There is slight mucosal thickening in several ethmoid air cells. Other visualized paranasal sinuses are clear. Orbits appear symmetric bilaterally. Other: Mastoid air cells are clear. There is debris in the right external auditory canal. IMPRESSION: Mild periventricular small vessel disease. No acute infarct evident. No mass or hemorrhage. There are areas of paranasal sinus disease. There is probable cerumen in the right external auditory canal. Electronically Signed   By: Lowella Grip III M.D.   On: 05/27/2018 13:25   Ct Angio Chest Pe W/cm &/or Wo Cm  Result Date: 05/22/2018 CLINICAL DATA:  Onset chest pain last night which has since resolved. History of lymphoma. EXAM: CT ANGIOGRAPHY CHEST WITH CONTRAST TECHNIQUE: Multidetector CT imaging of the chest was performed using the standard protocol during bolus administration of intravenous contrast. Multiplanar CT image reconstructions and MIPs were obtained to evaluate the vascular anatomy. CONTRAST:  100 mL ISOVUE-370 IOPAMIDOL (ISOVUE-370) INJECTION 76% COMPARISON:  PA and lateral chest earlier today. PET CT scan 03/14/2018. FINDINGS: Cardiovascular: No pulmonary embolus is identified. Heart size is upper normal. No pericardial effusion. No atherosclerosis. Port-A-Cath noted. Mediastinum/Nodes: No enlarged mediastinal, hilar, or axillary lymph nodes. Thyroid gland, trachea, and esophagus demonstrate no significant findings. Lungs/Pleura: No pleural effusion. Lungs demonstrate only mild dependent atelectasis. Upper Abdomen: There is partial visualization of cysts in kidneys which are unchanged. Imaged intra-abdominal contents are otherwise unremarkable.  Musculoskeletal: No acute or focal abnormality. Review of the MIP images confirms the above findings. IMPRESSION: Negative for pulmonary embolus.  No acute disease. Electronically Signed  By: Inge Rise M.D.   On: 05/22/2018 14:53    ASSESSMENT & PLAN:  63 y.o. 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  CT of the abdomen and pelvis performed on 12/22/2017 showed bulky left inguinal, left hemipelvic, and retroperitoneal adenopathy.   01/02/18 Left inguinal LN Biopsy revealed T-Cell/histocyte rich Large B-Cell Lymphoma  12/27/17 ECHO revealed LV EF of 55-60%   01/05/18 PET/CT revealedMassively enlarged pelvic lymph nodes intense metabolic activity consistent lymphoma. 2. Additional hypermetabolic lymph nodes in the porta hepatis and retroperitoneum LEFT aorta. 3. Solitary hypermetabolic mediastinal lymph node in the upper LEFT Mediastinum. 4. 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 5. Normal spleen.  HIV non reactive on 12/22/2017.Hep C and hep B serology negative.  03/14/18 PET/CT revealedPET-CT findings suggest an excellent response to chemotherapy. The abdominal lymphadenopathy has near completely resolved and demonstrates a near complete metabolic response. The pelvic and inguinal adenopathy has significantly decreased in size and the metabolic activity has significantly decreased. 2. Diffuse marrow activity likely due to chemotherapy and or marrow stimulating drugs. I do not see any discrete persistent lesions.   04/17/18 CT Head revealedSubtle mesial caudothalamic hypodensities may be artifact though, the could reflect encephalitis or Wernicke's encephalopathy. Consider MRI of the head with and without contrast. 2. Mild  chronic small vessel ischemic changes  2) left lower extremity swelling- now resolved Doppler ultrasound for DVT was negative in the left lower extremity.  Likely from venous compression +/- lymphatic obstruction from bulky left inguinal, left hemipelvic, and retroperitoneal adenopathy.   3) S/p Port a cath placement   4)h/oE.coli UTI - Pansensitive -Resolved.Also appearedto have BPH like symptomatology. -Was hospitalized 04/16/18 to 04/20/18 for E.coli sepsis from UTI, treated with antibiotics.  5)s/pE.COLi sepsis - likely from urinary source. Recent h/o E.coli UTI  6) s/p Symptomatic Anemia Hgb 6.7 - due to chemotherapy/sepsis- s/p PRBC transfusion. No overt evidence of bleeding. hgb now stable at 10.6  7) DVT prophylaxis -lovenoxSC in the hospital  8) HTN-was elevated in setting of improved po intake and steroids -improving control -conitnueon lisinopril 20 mg p.o. daily today -Continue Flomax for his BPH. -increasedamlodipine to 10 mgTo optimize BP control -will monitor and optimize rx accordingly.  9) Likely BPH - on flomax  PLAN: -Discussed pt labwork today, 05/29/18; blood counts and chemistries are stable overall  -The pt has no prohibitive toxicities from continuing C6D2 EPOCH-R at this time.   -Continue eating well, staying hydrated, and staying as active as reasonably possible   -Outpatient Rituxan and Neulasta on 06/04/18 -Will complete PET/CT for end of treatment efficacy evaluation  -transfuse prn for hgb<8- hgb stable at 9.9 at this time. -will restart Bactrim for UTI prophylaxis on discharge. -Lovenox for DVT prophylaxis   The total time spent in the appt was 25 minutes and more than 50% was on counseling and direct patient cares.    Sullivan Lone MD MS AAHIVMS [facility] Hematology/Oncology Physician [facility]  (Office):       [phone] (Work cell):  [phone] (Fax):           [phone]  05/29/2018 4:43 PM    I, Baldwin [place], am acting as a scribe for Dr. Sullivan Lone.   .I have reviewed the above documentation for accuracy and completeness, and I agree with the above. Sullivan Lone MD MS

## 2018-05-30 LAB — COMPREHENSIVE METABOLIC PANEL
ALT: 17 U/L (ref 0–44)
AST: 18 U/L (ref 15–41)
Albumin: 3.8 g/dL (ref 3.5–5.0)
Alkaline Phosphatase: 58 U/L (ref 38–126)
Anion gap: 9 (ref 5–15)
BUN: 14 mg/dL (ref 8–23)
CO2: 23 mmol/L (ref 22–32)
Calcium: 9.4 mg/dL (ref 8.9–10.3)
Creatinine, Ser: 0.76 mg/dL (ref 0.61–1.24)
GFR calc Af Amer: >60 mL/min (ref >60)
GFR calc non Af Amer: >60 mL/min (ref >60)
Glucose, Bld: 233 mg/dL — ABNORMAL HIGH (ref 70–99)
Potassium: 4 mmol/L (ref 3.5–5.1)
Sodium: 138 mmol/L (ref 135–145)
Total Bilirubin: 0.4 mg/dL (ref 0.3–1.2)
Total Protein: 6.4 g/dL — ABNORMAL LOW (ref 6.5–8.1)

## 2018-05-30 LAB — CBC
HCT: 32.6 % — ABNORMAL LOW (ref 39.0–52.0)
Hemoglobin: 10.3 g/dL — ABNORMAL LOW (ref 13.0–17.0)
MCH: 29.9 pg (ref 26.0–34.0)
MCHC: 31.6 g/dL (ref 30.0–36.0)
MCV: 94.8 fL (ref 80.0–100.0)
Platelets: 315 10*3/uL (ref 150–400)
RBC: 3.44 MIL/uL — ABNORMAL LOW (ref 4.22–5.81)
RDW: 19 % — ABNORMAL HIGH (ref 11.5–15.5)
WBC: 10.8 10*3/uL — ABNORMAL HIGH (ref 4.0–10.5)
nRBC: 0 % (ref 0.0–0.2)

## 2018-05-30 LAB — COMPREHENSIVE METABOLIC PANEL WITH GFR: Chloride: 106 mmol/L (ref 98–111)

## 2018-05-30 MED ORDER — VINCRISTINE SULFATE CHEMO INJECTION 1 MG/ML
Freq: Once | INTRAVENOUS | Status: AC
  Administered 2018-05-30: 14:00:00 via INTRAVENOUS
  Filled 2018-05-30: qty 13

## 2018-05-30 MED ORDER — SODIUM CHLORIDE 0.9 % IV SOLN
Freq: Once | INTRAVENOUS | Status: AC
  Administered 2018-05-30: 18 mg via INTRAVENOUS
  Filled 2018-05-30: qty 4

## 2018-05-30 NOTE — Progress Notes (Signed)
HEMATOLOGY/ONCOLOGY INPATIENT PROGRESS NOTE  Date of Service: 05/30/2018  Inpatient Attending: .Brunetta Genera, MD   SUBJECTIVE:   Jesus [name] reports that he is doing well overall.   The pt reports that he has not developed any new concerns in the interim. He notes that he has been eating well and has been moving his bowels well. The pt denies any vomiting, nausea, or diarrhea. He is enjoying stable energy levels.  Lab results today (05/30/18) of CBC w/diff and CMP is as follows: all values are WNL except for WBC at 10.8k, RBC at 3.44, HGB at 10.3, HCT at 32.6, RDW at 19.0, Glucose at 233, Total Protein at 6.4.  On review of systems, pt reports stable energy levels, eating well, moving his bowels well, stable breathing, and denies nausea, vomiting, diarrhea, constipation, abdominal pains, leg swelling, and any other symptoms.    OBJECTIVE:  NAD  PHYSICAL EXAMINATION: . Vitals:   05/29/18 2101 05/30/18 0553 05/30/18 1106 05/30/18 1340  BP: 137/78 (!) 131/92 137/82 (!) 153/88  Pulse: 83  65 81  Resp: 16 18 16 15   Temp: 97.8 F (36.6 C) 97.7 F (36.5 C) 98.1 F (36.7 C) 98.3 F (36.8 C)  TempSrc: Oral Oral Oral Oral  SpO2: 99%  100% 95%  Weight:      Height:       Filed Weights   05/28/18 1548  Weight: 258 lb 8 oz (117.3 kg)   .Body mass index is 35.06 kg/m.  GENERAL:alert, in no acute distress and comfortable SKIN: no acute rashes, no significant lesions EYES: conjunctiva are pink and non-injected, sclera anicteric OROPHARYNX: MMM, no exudates, no oropharyngeal erythema or ulceration NECK: supple, no JVD LYMPH:  no palpable lymphadenopathy in the cervical, axillary or inguinal regions LUNGS: clear to auscultation b/l with normal respiratory effort HEART: regular rate & rhythm ABDOMEN:  normoactive bowel sounds , non tender, not distended. No palpable hepatosplenomegaly.  Extremity: no pedal edema PSYCH: alert & oriented x 3 with fluent speech NEURO:  no focal motor/sensory deficits   MEDICAL HISTORY:  Past Medical History:  Diagnosis Date  . Allergy   . History of kidney stones   . Hyperlipidemia   . Hypertension   . Lymphadenopathy   . Pain, lower leg    Bilateral  . Peripheral arterial disease ([place])   . Pre-diabetes   . Red-green color blindness   . Snores   . Wears glasses     SURGICAL HISTORY: Past Surgical History:  Procedure Laterality Date  . CATARACT EXTRACTION W/ INTRAOCULAR LENS  IMPLANT, BILATERAL    . COLONOSCOPY    . dislodged salava stone    . FRACTURE SURGERY    . HAND ARTHROPLASTY  1995   crushed left hand  . INGUINAL LYMPH NODE BIOPSY Left 01/02/2018   Procedure: LEFT INGUINAL LYMPH NODE BIOPSY;  Surgeon: Rolm Bookbinder, MD;  Location: [facility];  Service: General;  Laterality: Left;  . IR IMAGING GUIDED PORT INSERTION  01/15/2018  . MICROLARYNGOSCOPY Left 01/17/2014   Procedure: MICROLARYNGOSCOPY WITH EXCISION OF THE BIOPSY OF LEFT VOCAL CORD LESION;  Surgeon: Izora Gala, MD;  Location: [facility];  Service: ENT;  Laterality: Left;  . ORIF FOOT FRACTURE  2005   left    SOCIAL HISTORY: Social History   Socioeconomic History  . Marital status: Divorced    Spouse name: Not on file  . Number of children: 3  . Years of education: Not on file  .  Highest education level: Not on file  Occupational History  . Not on file  Social Needs  . Financial resource strain: Not on file  . Food insecurity:    Worry: Not on file    Inability: Not on file  . Transportation needs:    Medical: Not on file    Non-medical: Not on file  Tobacco Use  . Smoking status: Current Every Day Smoker    Packs/day: 0.50    Years: 36.00    Pack years: 18.00    Types: Cigarettes  . Smokeless tobacco: Never Used  Substance and Sexual Activity  . Alcohol use: Yes    Alcohol/week: 15.0 standard drinks    Types: 10 Cans of beer, 5 Shots of liquor per week    Comment: weekends  . Drug use: Yes    Types:  Cocaine    Comment: reports cocaine usage ~2X/ month; last use 12/26/17  . Sexual activity: Not on file  Lifestyle  . Physical activity:    Days per week: Not on file    Minutes per session: Not on file  . Stress: Not on file  Relationships  . Social connections:    Talks on phone: Not on file    Gets together: Not on file    Attends religious service: Not on file    Active member of club or organization: Not on file    Attends meetings of clubs or organizations: Not on file    Relationship status: Not on file  . Intimate partner violence:    Fear of current or ex partner: Not on file    Emotionally abused: Not on file    Physically abused: Not on file    Forced sexual activity: Not on file  Other Topics Concern  . Not on file  Social History Narrative  . Not on file    FAMILY HISTORY: Family History  Problem Relation Age of Onset  . Breast cancer Mother   . Diabetes Father   . Hypertension Father   . Stroke Father   . Mental illness Sister   . Hypertension Daughter   . Mental illness Daughter   . Hypertension Brother   . Colon cancer Brother   . Breast cancer Sister     ALLERGIES:  is allergic to bee venom.  MEDICATIONS:  Scheduled Meds: . amLODipine  10 mg Oral Daily  . aspirin EC  81 mg Oral Daily  . DOXOrubicin/vinCRIStine/etoposide CHEMO IV infusion for Inpatient CI   Intravenous Once  . enoxaparin (LOVENOX) injection  40 mg Subcutaneous Q24H  . lisinopril  20 mg Oral Daily  . predniSONE  60 mg Oral QAC breakfast  . tamsulosin  0.4 mg Oral QPC supper   Continuous Infusions: . sodium chloride 10 mL/hr at 05/30/18 0600   PRN Meds:.acetaminophen, Cold Pack, Hot Pack, hydrocortisone, LORazepam, prochlorperazine  REVIEW OF SYSTEMS:    A 10+ POINT REVIEW OF SYSTEMS WAS OBTAINED including neurology, dermatology, psychiatry, cardiac, respiratory, lymph, extremities, GI, GU, Musculoskeletal, constitutional, breasts, reproductive, HEENT.  All pertinent positives  are noted in the HPI.  All others are negative.    LABORATORY DATA:  I have reviewed the data as listed  . CBC Latest Ref Rng & Units 05/30/2018 05/29/2018 05/28/2018  WBC 4.0 - 10.5 K/uL 10.8(H) 8.4 6.4  Hemoglobin 13.0 - 17.0 g/dL 10.3(L) 9.9(L) 10.6(L)  Hematocrit 39.0 - 52.0 % 32.6(L) 31.8(L) 34.6(L)  Platelets 150 - 400 K/uL 315 263 281    . CMP  Latest Ref Rng & Units 05/30/2018 05/29/2018 05/28/2018  Glucose 70 - 99 mg/dL 233(H) 275(H) 198(H)  BUN 8 - 23 mg/dL 14 18 16   Creatinine 0.61 - 1.24 mg/dL 0.76 0.79 1.00  Sodium 135 - 145 mmol/L 138 135 141  Potassium 3.5 - 5.1 mmol/L 4.0 4.4 3.6  Chloride 98 - 111 mmol/L 106 104 108  CO2 22 - 32 mmol/L 23 23 24   Calcium 8.9 - 10.3 mg/dL 9.4 8.9 9.2  Total Protein 6.5 - 8.1 g/dL 6.4(L) 6.5 7.0  Total Bilirubin 0.3 - 1.2 mg/dL 0.4 0.4 0.6  Alkaline Phos 38 - 126 U/L 58 62 72  AST 15 - 41 U/L 18 19 23   ALT 0 - 44 U/L 17 19 19      RADIOGRAPHIC STUDIES: I have personally reviewed the radiological images as listed and agreed with the findings in the report. Dg Chest 2 View  Result Date: 05/22/2018 CLINICAL DATA:  Low back pain, chest pain EXAM: CHEST - 2 VIEW COMPARISON:  04/16/2018 FINDINGS: There is a right-sided Port-A-Cath with the tip projecting over the SVC. There is no focal parenchymal opacity. There is no pleural effusion or pneumothorax. The heart size is top-normal. The osseous structures are unremarkable. IMPRESSION: No active cardiopulmonary disease. Electronically Signed   By: Kathreen Devoid   On: 05/22/2018 12:04   Ct Head Wo Contrast  Result Date: 05/27/2018 CLINICAL DATA:  Near syncope and dizziness. Large B-cell lymphoma EXAM: CT HEAD WITHOUT CONTRAST TECHNIQUE: Contiguous axial images were obtained from the base of the skull through the vertex without intravenous contrast. COMPARISON:  April 17, 2018 FINDINGS: Brain: The ventricles are normal in size and configuration. There is no intracranial mass, hemorrhage,  extra-axial fluid collection, or midline shift. There is mild periventricular small vessel disease. Elsewhere the brain parenchyma appears unremarkable. There is no appreciable acute infarct. Vascular: There is no hyperdense vessel. No appreciable vascular calcification. Skull: The bony calvarium appears intact. Sinuses/Orbits: There is opacification in the posterior right maxillary antrum. There is slight mucosal thickening in several ethmoid air cells. Other visualized paranasal sinuses are clear. Orbits appear symmetric bilaterally. Other: Mastoid air cells are clear. There is debris in the right external auditory canal. IMPRESSION: Mild periventricular small vessel disease. No acute infarct evident. No mass or hemorrhage. There are areas of paranasal sinus disease. There is probable cerumen in the right external auditory canal. Electronically Signed   By: Lowella Grip III M.D.   On: 05/27/2018 13:25   Ct Angio Chest Pe W/cm &/or Wo Cm  Result Date: 05/22/2018 CLINICAL DATA:  Onset chest pain last night which has since resolved. History of lymphoma. EXAM: CT ANGIOGRAPHY CHEST WITH CONTRAST TECHNIQUE: Multidetector CT imaging of the chest was performed using the standard protocol during bolus administration of intravenous contrast. Multiplanar CT image reconstructions and MIPs were obtained to evaluate the vascular anatomy. CONTRAST:  100 mL ISOVUE-370 IOPAMIDOL (ISOVUE-370) INJECTION 76% COMPARISON:  PA and lateral chest earlier today. PET CT scan 03/14/2018. FINDINGS: Cardiovascular: No pulmonary embolus is identified. Heart size is upper normal. No pericardial effusion. No atherosclerosis. Port-A-Cath noted. Mediastinum/Nodes: No enlarged mediastinal, hilar, or axillary lymph nodes. Thyroid gland, trachea, and esophagus demonstrate no significant findings. Lungs/Pleura: No pleural effusion. Lungs demonstrate only mild dependent atelectasis. Upper Abdomen: There is partial visualization of cysts in  kidneys which are unchanged. Imaged intra-abdominal contents are otherwise unremarkable. Musculoskeletal: No acute or focal abnormality. Review of the MIP images confirms the above findings. IMPRESSION: Negative for pulmonary  embolus.  No acute disease. Electronically Signed   By: Inge Rise M.D.   On: 05/22/2018 14:53    ASSESSMENT & PLAN:  63 y.o. 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  CT of the abdomen and pelvis performed on 12/22/2017 showed bulky left inguinal, left hemipelvic, and retroperitoneal adenopathy.   01/02/18 Left inguinal LN Biopsy revealed T-Cell/histocyte rich Large B-Cell Lymphoma  12/27/17 ECHO revealed LV EF of 55-60%   01/05/18 PET/CT revealedMassively enlarged pelvic lymph nodes intense metabolic activity consistent lymphoma. 2. Additional hypermetabolic lymph nodes in the porta hepatis and retroperitoneum LEFT aorta. 3. Solitary hypermetabolic mediastinal lymph node in the upper LEFT Mediastinum. 4. 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 5. Normal spleen.  HIV non reactive on 12/22/2017.Hep C and hep B serology negative.  03/14/18 PET/CT revealedPET-CT findings suggest an excellent response to chemotherapy. The abdominal lymphadenopathy has near completely resolved and demonstrates a near complete metabolic response. The pelvic and inguinal adenopathy has significantly decreased in size and the metabolic activity has significantly decreased. 2. Diffuse marrow activity likely due to chemotherapy and or marrow stimulating drugs. I do not see any discrete persistent lesions.   04/17/18 CT Head revealedSubtle mesial caudothalamic hypodensities may be artifact though, the could reflect encephalitis  or Wernicke's encephalopathy. Consider MRI of the head with and without contrast. 2. Mild chronic small vessel ischemic changes  2) left lower extremity swelling- now resolved Doppler ultrasound for DVT was negative in the left lower extremity.  Likely from venous compression +/- lymphatic obstruction from bulky left inguinal, left hemipelvic, and retroperitoneal adenopathy.   3) S/p Port a cath placement   4)h/oE.coli UTI - Pansensitive -Resolved.Also appearedto have BPH like symptomatology. -Was hospitalized 04/16/18 to 04/20/18 for E.coli sepsis from UTI, treated with antibiotics.  5)s/pE.COLi sepsis - likely from urinary source. Recent h/o E.coli UTI  6) s/p Symptomatic Anemia Hgb 6.7 - due to chemotherapy/sepsis- s/p PRBC transfusion. No overt evidence of bleeding. hgb now stable at 10.6  7) DVT prophylaxis -lovenoxSC in the hospital  8) HTN-was elevated in setting of improved po intake and steroids -improving control -conitnueon lisinopril 20 mg p.o. daily today -Continue Flomax for his BPH. -increasedamlodipine to 10 mgTo optimize BP control -will monitor and optimize rx accordingly.  9) Likely BPH - on flomax  PLAN: -Discussed pt labwork today, 05/30/18; blood counts and chemistries are stable -The pt has no prohibitive toxicities from continuing C6D3 EPOCH-R at this time.   -Continue eating well, staying hydrated, and staying as active as reasonably possible   -Outpatient Rituxan and Neulasta on 06/04/18 -Will complete PET/CT for end of treatment efficacy evaluation  -transfuse prn for hgb<8- hgb stable at 9.9 at this time. -will restart Bactrim for UTI prophylaxis on discharge. -Lovenox for DVT prophylaxis   The total time spent in the appt was 25 minutes and more than 50% was on counseling and direct patient cares.    Sullivan Lone MD MS AAHIVMS [facility] Hematology/Oncology Physician [facility]  (Office):        [phone] (Work cell):  [phone] (Fax):           [phone]  05/30/2018 4:20 PM   I, Baldwin [place], am acting as a scribe for Dr. Sullivan Lone.   .I have reviewed the above documentation for accuracy and completeness, and I agree with  the above. Sullivan Lone MD MS

## 2018-05-30 NOTE — Progress Notes (Signed)
Inpatient Diabetes Program Recommendations  AACE/ADA: New Consensus Statement on Inpatient Glycemic Control (2015)  Target Ranges:  Prepandial:   less than 140 mg/dL      Peak postprandial:   less than 180 mg/dL (1-2 hours)      Critically ill patients:  140 - 180 mg/dL   Lab Results  Component Value Date   GLUCAP 162 (H) 05/27/2018   HGBA1C 5.4 05/30/2016    Review of Glycemic Control  Diabetes history: Pre-diabetes Outpatient Diabetes medications: None Current orders for Inpatient glycemic control: None  Lab glucose 233, 275, 198. On Prednisone 60 mg QAM   Inpatient Diabetes Program Recommendations:     Add Novolog 0-9 units tidwc and hs  Will continue to follow.  Thank you. Lorenda Peck, RD, LDN, CDE Inpatient Diabetes Coordinator [phone]

## 2018-05-31 LAB — COMPREHENSIVE METABOLIC PANEL
ALT: 17 U/L (ref 0–44)
AST: 15 U/L (ref 15–41)
Albumin: 3.6 g/dL (ref 3.5–5.0)
Alkaline Phosphatase: 45 U/L (ref 38–126)
Anion gap: 6 (ref 5–15)
BUN: 16 mg/dL (ref 8–23)
CO2: 26 mmol/L (ref 22–32)
Calcium: 9.2 mg/dL (ref 8.9–10.3)
Creatinine, Ser: 0.73 mg/dL (ref 0.61–1.24)
GFR calc Af Amer: >60 mL/min (ref >60)
GFR calc non Af Amer: >60 mL/min (ref >60)
Glucose, Bld: 227 mg/dL — ABNORMAL HIGH (ref 70–99)
Sodium: 138 mmol/L (ref 135–145)
Total Bilirubin: 0.4 mg/dL (ref 0.3–1.2)
Total Protein: 6.1 g/dL — ABNORMAL LOW (ref 6.5–8.1)

## 2018-05-31 LAB — COMPREHENSIVE METABOLIC PANEL WITH GFR
Chloride: 106 mmol/L (ref 98–111)
Potassium: 3.7 mmol/L (ref 3.5–5.1)

## 2018-05-31 LAB — CBC
HCT: 30.4 % — ABNORMAL LOW (ref 39.0–52.0)
Hemoglobin: 9.5 g/dL — ABNORMAL LOW (ref 13.0–17.0)
MCH: 29 pg (ref 26.0–34.0)
MCHC: 31.3 g/dL (ref 30.0–36.0)
MCV: 92.7 fL (ref 80.0–100.0)
Platelets: 300 10*3/uL (ref 150–400)
RBC: 3.28 MIL/uL — ABNORMAL LOW (ref 4.22–5.81)
RDW: 18.6 % — ABNORMAL HIGH (ref 11.5–15.5)
WBC: 6 10*3/uL (ref 4.0–10.5)
nRBC: 0 % (ref 0.0–0.2)

## 2018-05-31 MED ORDER — SODIUM CHLORIDE 0.9 % IV SOLN
Freq: Once | INTRAVENOUS | Status: AC
  Administered 2018-05-31: 8 mg via INTRAVENOUS
  Filled 2018-05-31: qty 4

## 2018-05-31 MED ORDER — VINCRISTINE SULFATE CHEMO INJECTION 1 MG/ML
Freq: Once | INTRAVENOUS | Status: AC
  Administered 2018-05-31: 13:00:00 via INTRAVENOUS
  Filled 2018-05-31: qty 13

## 2018-05-31 NOTE — Progress Notes (Signed)
HEMATOLOGY/ONCOLOGY INPATIENT PROGRESS NOTE  Date of Service: 05/31/2018  Inpatient Attending: .Brunetta Genera, MD   SUBJECTIVE:   Jesus [name] reports that he is doing well overall.   The pt reports that he has not developed any new concerns since yesterday. He endorses good energy levels, has been eating well, and is staying hydrated. The pt denies abdominal pains and leg swelling.   Lab results today (05/31/18) of CBC and CMP is as follows: all values are WNL except for RBC at 3.28, HGB at 9.5, HCT at 30.4, RDW at 18.6, Glucose at 227, Total Protein at 6.1.  On review of systems, pt reports eating well, good energy levels, staying hydrated, and denies abdominal pains, leg swelling, and any other symptoms.    OBJECTIVE:  NAD  PHYSICAL EXAMINATION: . Vitals:   05/30/18 1340 05/30/18 2131 05/31/18 0509 05/31/18 1323  BP: (!) 153/88 (!) 143/83 131/84 (!) 145/90  Pulse: 81 61 63 66  Resp: 15 16 17 15   Temp: 98.3 F (36.8 C) 97.8 F (36.6 C) 97.6 F (36.4 C) 98.1 F (36.7 C)  TempSrc: Oral Oral Oral Oral  SpO2: 95% 98% 100% 97%  Weight:      Height:       Filed Weights   05/28/18 1548  Weight: 258 lb 8 oz (117.3 kg)   .Body mass index is 35.06 kg/m.  GENERAL:alert, in no acute distress and comfortable SKIN: no acute rashes, no significant lesions EYES: conjunctiva are pink and non-injected, sclera anicteric OROPHARYNX: MMM, no exudates, no oropharyngeal erythema or ulceration NECK: supple, no JVD LYMPH:  no palpable lymphadenopathy in the cervical, axillary or inguinal regions LUNGS: clear to auscultation b/l with normal respiratory effort HEART: regular rate & rhythm ABDOMEN:  normoactive bowel sounds , non tender, not distended. No palpable hepatosplenomegaly.  Extremity: no pedal edema PSYCH: alert & oriented x 3 with fluent speech NEURO: no focal motor/sensory deficits   MEDICAL HISTORY:  Past Medical History:  Diagnosis Date  . Allergy   .  History of kidney stones   . Hyperlipidemia   . Hypertension   . Lymphadenopathy   . Pain, lower leg    Bilateral  . Peripheral arterial disease ([place])   . Pre-diabetes   . Red-green color blindness   . Snores   . Wears glasses     SURGICAL HISTORY: Past Surgical History:  Procedure Laterality Date  . CATARACT EXTRACTION W/ INTRAOCULAR LENS  IMPLANT, BILATERAL    . COLONOSCOPY    . dislodged salava stone    . FRACTURE SURGERY    . HAND ARTHROPLASTY  1995   crushed left hand  . INGUINAL LYMPH NODE BIOPSY Left 01/02/2018   Procedure: LEFT INGUINAL LYMPH NODE BIOPSY;  Surgeon: Rolm Bookbinder, MD;  Location: [facility];  Service: General;  Laterality: Left;  . IR IMAGING GUIDED PORT INSERTION  01/15/2018  . MICROLARYNGOSCOPY Left 01/17/2014   Procedure: MICROLARYNGOSCOPY WITH EXCISION OF THE BIOPSY OF LEFT VOCAL CORD LESION;  Surgeon: Izora Gala, MD;  Location: [facility];  Service: ENT;  Laterality: Left;  . ORIF FOOT FRACTURE  2005   left    SOCIAL HISTORY: Social History   Socioeconomic History  . Marital status: Divorced    Spouse name: Not on file  . Number of children: 3  . Years of education: Not on file  . Highest education level: Not on file  Occupational History  . Not on file  Social Needs  .  Financial resource strain: Not on file  . Food insecurity:    Worry: Not on file    Inability: Not on file  . Transportation needs:    Medical: Not on file    Non-medical: Not on file  Tobacco Use  . Smoking status: Current Every Day Smoker    Packs/day: 0.50    Years: 36.00    Pack years: 18.00    Types: Cigarettes  . Smokeless tobacco: Never Used  Substance and Sexual Activity  . Alcohol use: Yes    Alcohol/week: 15.0 standard drinks    Types: 10 Cans of beer, 5 Shots of liquor per week    Comment: weekends  . Drug use: Yes    Types: Cocaine    Comment: reports cocaine usage ~2X/ month; last use 12/26/17  . Sexual activity: Not on file    Lifestyle  . Physical activity:    Days per week: Not on file    Minutes per session: Not on file  . Stress: Not on file  Relationships  . Social connections:    Talks on phone: Not on file    Gets together: Not on file    Attends religious service: Not on file    Active member of club or organization: Not on file    Attends meetings of clubs or organizations: Not on file    Relationship status: Not on file  . Intimate partner violence:    Fear of current or ex partner: Not on file    Emotionally abused: Not on file    Physically abused: Not on file    Forced sexual activity: Not on file  Other Topics Concern  . Not on file  Social History Narrative  . Not on file    FAMILY HISTORY: Family History  Problem Relation Age of Onset  . Breast cancer Mother   . Diabetes Father   . Hypertension Father   . Stroke Father   . Mental illness Sister   . Hypertension Daughter   . Mental illness Daughter   . Hypertension Brother   . Colon cancer Brother   . Breast cancer Sister     ALLERGIES:  is allergic to bee venom.  MEDICATIONS:  Scheduled Meds: . amLODipine  10 mg Oral Daily  . aspirin EC  81 mg Oral Daily  . DOXOrubicin/vinCRIStine/etoposide CHEMO IV infusion for Inpatient CI   Intravenous Once  . enoxaparin (LOVENOX) injection  40 mg Subcutaneous Q24H  . lisinopril  20 mg Oral Daily  . predniSONE  60 mg Oral QAC breakfast  . tamsulosin  0.4 mg Oral QPC supper   Continuous Infusions: . sodium chloride 10 mL/hr at 05/30/18 0600   PRN Meds:.acetaminophen, Cold Pack, Hot Pack, hydrocortisone, LORazepam, prochlorperazine  REVIEW OF SYSTEMS:    A 10+ POINT REVIEW OF SYSTEMS WAS OBTAINED including neurology, dermatology, psychiatry, cardiac, respiratory, lymph, extremities, GI, GU, Musculoskeletal, constitutional, breasts, reproductive, HEENT.  All pertinent positives are noted in the HPI.  All others are negative.    LABORATORY DATA:  I have reviewed the data as  listed  . CBC Latest Ref Rng & Units 05/31/2018 05/30/2018 05/29/2018  WBC 4.0 - 10.5 K/uL 6.0 10.8(H) 8.4  Hemoglobin 13.0 - 17.0 g/dL 9.5(L) 10.3(L) 9.9(L)  Hematocrit 39.0 - 52.0 % 30.4(L) 32.6(L) 31.8(L)  Platelets 150 - 400 K/uL 300 315 263    . CMP Latest Ref Rng & Units 05/31/2018 05/30/2018 05/29/2018  Glucose 70 - 99 mg/dL 227(H) 233(H) 275(H)  BUN 8 - 23 mg/dL 16 14 18   Creatinine 0.61 - 1.24 mg/dL 0.73 0.76 0.79  Sodium 135 - 145 mmol/L 138 138 135  Potassium 3.5 - 5.1 mmol/L 3.7 4.0 4.4  Chloride 98 - 111 mmol/L 106 106 104  CO2 22 - 32 mmol/L 26 23 23   Calcium 8.9 - 10.3 mg/dL 9.2 9.4 8.9  Total Protein 6.5 - 8.1 g/dL 6.1(L) 6.4(L) 6.5  Total Bilirubin 0.3 - 1.2 mg/dL 0.4 0.4 0.4  Alkaline Phos 38 - 126 U/L 45 58 62  AST 15 - 41 U/L 15 18 19   ALT 0 - 44 U/L 17 17 19      RADIOGRAPHIC STUDIES: I have personally reviewed the radiological images as listed and agreed with the findings in the report. Dg Chest 2 View  Result Date: 05/22/2018 CLINICAL DATA:  Low back pain, chest pain EXAM: CHEST - 2 VIEW COMPARISON:  04/16/2018 FINDINGS: There is a right-sided Port-A-Cath with the tip projecting over the SVC. There is no focal parenchymal opacity. There is no pleural effusion or pneumothorax. The heart size is top-normal. The osseous structures are unremarkable. IMPRESSION: No active cardiopulmonary disease. Electronically Signed   By: Kathreen Devoid   On: 05/22/2018 12:04   Ct Head Wo Contrast  Result Date: 05/27/2018 CLINICAL DATA:  Near syncope and dizziness. Large B-cell lymphoma EXAM: CT HEAD WITHOUT CONTRAST TECHNIQUE: Contiguous axial images were obtained from the base of the skull through the vertex without intravenous contrast. COMPARISON:  April 17, 2018 FINDINGS: Brain: The ventricles are normal in size and configuration. There is no intracranial mass, hemorrhage, extra-axial fluid collection, or midline shift. There is mild periventricular small vessel disease.  Elsewhere the brain parenchyma appears unremarkable. There is no appreciable acute infarct. Vascular: There is no hyperdense vessel. No appreciable vascular calcification. Skull: The bony calvarium appears intact. Sinuses/Orbits: There is opacification in the posterior right maxillary antrum. There is slight mucosal thickening in several ethmoid air cells. Other visualized paranasal sinuses are clear. Orbits appear symmetric bilaterally. Other: Mastoid air cells are clear. There is debris in the right external auditory canal. IMPRESSION: Mild periventricular small vessel disease. No acute infarct evident. No mass or hemorrhage. There are areas of paranasal sinus disease. There is probable cerumen in the right external auditory canal. Electronically Signed   By: Lowella Grip III M.D.   On: 05/27/2018 13:25   Ct Angio Chest Pe W/cm &/or Wo Cm  Result Date: 05/22/2018 CLINICAL DATA:  Onset chest pain last night which has since resolved. History of lymphoma. EXAM: CT ANGIOGRAPHY CHEST WITH CONTRAST TECHNIQUE: Multidetector CT imaging of the chest was performed using the standard protocol during bolus administration of intravenous contrast. Multiplanar CT image reconstructions and MIPs were obtained to evaluate the vascular anatomy. CONTRAST:  100 mL ISOVUE-370 IOPAMIDOL (ISOVUE-370) INJECTION 76% COMPARISON:  PA and lateral chest earlier today. PET CT scan 03/14/2018. FINDINGS: Cardiovascular: No pulmonary embolus is identified. Heart size is upper normal. No pericardial effusion. No atherosclerosis. Port-A-Cath noted. Mediastinum/Nodes: No enlarged mediastinal, hilar, or axillary lymph nodes. Thyroid gland, trachea, and esophagus demonstrate no significant findings. Lungs/Pleura: No pleural effusion. Lungs demonstrate only mild dependent atelectasis. Upper Abdomen: There is partial visualization of cysts in kidneys which are unchanged. Imaged intra-abdominal contents are otherwise unremarkable.  Musculoskeletal: No acute or focal abnormality. Review of the MIP images confirms the above findings. IMPRESSION: Negative for pulmonary embolus.  No acute disease. Electronically Signed   By: Inge Rise M.D.   On: 05/22/2018  14:53    ASSESSMENT & PLAN:  63 y.o. 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  CT of the abdomen and pelvis performed on 12/22/2017 showed bulky left inguinal, left hemipelvic, and retroperitoneal adenopathy.   01/02/18 Left inguinal LN Biopsy revealed T-Cell/histocyte rich Large B-Cell Lymphoma  12/27/17 ECHO revealed LV EF of 55-60%   01/05/18 PET/CT revealedMassively enlarged pelvic lymph nodes intense metabolic activity consistent lymphoma. 2. Additional hypermetabolic lymph nodes in the porta hepatis and retroperitoneum LEFT aorta. 3. Solitary hypermetabolic mediastinal lymph node in the upper LEFT Mediastinum. 4. 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 5. Normal spleen.  HIV non reactive on 12/22/2017.Hep C and hep B serology negative.  03/14/18 PET/CT revealedPET-CT findings suggest an excellent response to chemotherapy. The abdominal lymphadenopathy has near completely resolved and demonstrates a near complete metabolic response. The pelvic and inguinal adenopathy has significantly decreased in size and the metabolic activity has significantly decreased. 2. Diffuse marrow activity likely due to chemotherapy and or marrow stimulating drugs. I do not see any discrete persistent lesions.   04/17/18 CT Head revealedSubtle mesial caudothalamic hypodensities may be artifact though, the could reflect encephalitis or Wernicke's encephalopathy. Consider MRI of the head with and without contrast. 2. Mild  chronic small vessel ischemic changes  2) left lower extremity swelling- now resolved Doppler ultrasound for DVT was negative in the left lower extremity.  Likely from venous compression +/- lymphatic obstruction from bulky left inguinal, left hemipelvic, and retroperitoneal adenopathy.   3) S/p Port a cath placement   4)h/oE.coli UTI - Pansensitive -Resolved.Also appearedto have BPH like symptomatology. -Was hospitalized 04/16/18 to 04/20/18 for E.coli sepsis from UTI, treated with antibiotics.  5)s/pE.COLi sepsis - likely from urinary source. Recent h/o E.coli UTI  6) s/p Symptomatic Anemia Hgb 6.7 - due to chemotherapy/sepsis- s/p PRBC transfusion. No overt evidence of bleeding. hgb now stable at 10.6  7) DVT prophylaxis -lovenoxSC in the hospital  8) HTN-was elevated in setting of improved po intake and steroids -improving control -conitnueon lisinopril 20 mg p.o. daily today -Continue Flomax for his BPH. -increasedamlodipine to 10 mgTo optimize BP control -will monitor and optimize rx accordingly.  9) Likely BPH - on flomax  PLAN: -Discussed pt labwork today, 05/31/18; blood counts and chemistries are stable -The pt has no prohibitive toxicities from continuing C6D4 EPOCH-R at this time.   -Continue eating well, staying hydrated, and staying as active as reasonably possible  -Pt will resume Bactrim when he is discharged for 3 weeks -Outpatient Rituxan and Neulasta on 06/04/18 -Will complete PET/CT for end of treatment efficacy evaluation  -transfuse prn for hgb<8- hgb stable at 9.9 at this time. -will restart Bactrim for UTI prophylaxis on discharge. -Lovenox for DVT prophylaxis   The total time spent in the appt was 25 minutes and more than 50% was on counseling and direct patient cares.    Sullivan Lone MD MS AAHIVMS [facility] Hematology/Oncology Physician [facility]  (Office):       [phone] (Work cell):   [phone] (Fax):           [phone]  05/31/2018 4:04 PM   I, Baldwin [place], am acting as a scribe for Dr. Sullivan Lone.   .I have reviewed the above documentation for accuracy and completeness, and I agree with the above. Sullivan Lone MD MS

## 2018-05-31 NOTE — Progress Notes (Signed)
Chemotherapy dosage and calculations checked and reviewed with Laural Benes RN.

## 2018-06-01 LAB — COMPREHENSIVE METABOLIC PANEL WITH GFR
Potassium: 3.7 mmol/L (ref 3.5–5.1)
Total Bilirubin: 0.7 mg/dL (ref 0.3–1.2)

## 2018-06-01 LAB — COMPREHENSIVE METABOLIC PANEL
ALT: 18 U/L (ref 0–44)
AST: 18 U/L (ref 15–41)
Albumin: 3.6 g/dL (ref 3.5–5.0)
Alkaline Phosphatase: 47 U/L (ref 38–126)
Anion gap: 7 (ref 5–15)
BUN: 15 mg/dL (ref 8–23)
CO2: 26 mmol/L (ref 22–32)
Calcium: 9 mg/dL (ref 8.9–10.3)
Chloride: 106 mmol/L (ref 98–111)
Creatinine, Ser: 0.74 mg/dL (ref 0.61–1.24)
GFR calc Af Amer: >60 mL/min (ref >60)
GFR calc non Af Amer: >60 mL/min (ref >60)
Glucose, Bld: 185 mg/dL — ABNORMAL HIGH (ref 70–99)
Sodium: 139 mmol/L (ref 135–145)
Total Protein: 5.9 g/dL — ABNORMAL LOW (ref 6.5–8.1)

## 2018-06-01 LAB — CBC
HCT: 30.7 % — ABNORMAL LOW (ref 39.0–52.0)
Hemoglobin: 9.7 g/dL — ABNORMAL LOW (ref 13.0–17.0)
MCH: 29.1 pg (ref 26.0–34.0)
MCHC: 31.6 g/dL (ref 30.0–36.0)
MCV: 92.2 fL (ref 80.0–100.0)
Platelets: 310 10*3/uL (ref 150–400)
RBC: 3.33 MIL/uL — ABNORMAL LOW (ref 4.22–5.81)
RDW: 17.8 % — ABNORMAL HIGH (ref 11.5–15.5)
WBC: 4 10*3/uL (ref 4.0–10.5)
nRBC: 0 % (ref 0.0–0.2)

## 2018-06-01 MED ORDER — DEXAMETHASONE 4 MG PO TABS: 8.0000 mg | ORAL_TABLET | Freq: Two times a day (BID) | ORAL | 0 refills | Status: DC

## 2018-06-01 MED ORDER — SODIUM CHLORIDE 0.9 % IV SOLN
750.0000 mg/m2 | Freq: Once | INTRAVENOUS | Status: AC
  Administered 2018-06-01: 1980 mg via INTRAVENOUS
  Filled 2018-06-01: qty 99

## 2018-06-01 MED ORDER — SODIUM CHLORIDE 0.9 % IV SOLN
Freq: Once | INTRAVENOUS | Status: AC
  Administered 2018-06-01: 36 mg via INTRAVENOUS
  Filled 2018-06-01: qty 8

## 2018-06-01 MED ORDER — HEPARIN SOD (PORK) LOCK FLUSH 100 UNIT/ML IV SOLN
500.0000 [IU] | INTRAVENOUS | Status: AC | PRN
  Administered 2018-06-01: 500 [IU]

## 2018-06-01 MED ORDER — SULFAMETHOXAZOLE-TRIMETHOPRIM 800-160 MG PO TABS: 1.0000 | ORAL_TABLET | Freq: Every day | ORAL | 0 refills | Status: DC

## 2018-06-01 NOTE — Discharge Summary (Signed)
[place]  [phone]) [phone] Fax:([phone]) [phone]    Physician Discharge Summary     Patient ID: Jesus [name] MRN: [id number] DOB/AGE: 63/04/1956 63 y.o.  Admit date: 05/28/2018 Discharge date: 06/01/2018  Primary Care Physician:  Shirline Frees, MD   Discharge Diagnoses:    Present on Admission: . Diffuse large B cell lymphoma [facility])   Discharge Medications:  Allergies as of 06/01/2018      Reactions   Bee Venom Anaphylaxis      Medication List    STOP taking these medications   acetaminophen 500 MG tablet Commonly known as:  TYLENOL   ibuprofen 400 MG tablet Commonly known as:  ADVIL,MOTRIN   LORazepam 0.5 MG tablet Commonly known as:  ATIVAN   methocarbamol 750 MG tablet Commonly known as:  ROBAXIN-750     TAKE these medications   amLODipine 10 MG tablet Commonly known as:  NORVASC Take 1 tablet (10 mg total) by mouth daily.   aspirin 81 MG tablet Take 81 mg by mouth daily.   dexamethasone 4 MG tablet Commonly known as:  DECADRON Take 2 tablets (8 mg total) by mouth 2 (two) times daily with a meal. Take two times a day starting the day after chemotherapy for 3 days.   hydrocortisone 2.5 % rectal cream Commonly known as:  ANUSOL-HC Place 1 application rectally 2 (two) times daily as needed for hemorrhoids.   lisinopril 20 MG tablet Commonly known as:  PRINIVIL,ZESTRIL Take 1 tablet (20 mg total) by mouth daily.   multivitamin with minerals tablet Take 1 tablet by mouth daily.   prochlorperazine 10 MG tablet Commonly known as:  COMPAZINE Take 1 tablet (10 mg total) by mouth every 6 (six) hours as needed (Nausea or vomiting).   sulfamethoxazole-trimethoprim 800-160 MG tablet Commonly known as:  BACTRIM DS,SEPTRA DS Take 1 tablet by mouth daily.   tamsulosin 0.4 MG Caps capsule Commonly known as:  FLOMAX Take 1 capsule (0.4 mg total) by mouth daily after supper.        Disposition and Follow-up:    Significant Diagnostic Studies:  Dg Chest 2 View  Result Date: 05/22/2018 CLINICAL DATA:  Low back pain, chest pain EXAM: CHEST - 2 VIEW COMPARISON:  04/16/2018 FINDINGS: There is a right-sided Port-A-Cath with the tip projecting over the SVC. There is no focal parenchymal opacity. There is no pleural effusion or pneumothorax. The heart size is top-normal. The osseous structures are unremarkable. IMPRESSION: No active cardiopulmonary disease. Electronically Signed   By: Kathreen Devoid   On: 05/22/2018 12:04   Ct Head Wo Contrast  Result Date: 05/27/2018 CLINICAL DATA:  Near syncope and dizziness. Large B-cell lymphoma EXAM: CT HEAD WITHOUT CONTRAST TECHNIQUE: Contiguous axial images were obtained from the base of the skull through the vertex without intravenous contrast. COMPARISON:  April 17, 2018 FINDINGS: Brain: The ventricles are normal in size and configuration. There is no intracranial mass, hemorrhage, extra-axial fluid collection, or midline shift. There is mild periventricular small vessel disease. Elsewhere the brain parenchyma appears unremarkable. There is no appreciable acute infarct. Vascular: There is no hyperdense vessel. No appreciable vascular calcification. Skull: The bony calvarium appears intact. Sinuses/Orbits: There is opacification in the posterior right maxillary antrum. There is slight mucosal thickening in several ethmoid air cells. Other visualized paranasal sinuses are clear. Orbits appear symmetric bilaterally. Other: Mastoid air cells are clear. There is debris in the right external auditory canal. IMPRESSION: Mild periventricular small vessel disease. No acute  infarct evident. No mass or hemorrhage. There are areas of paranasal sinus disease. There is probable cerumen in the right external auditory canal. Electronically Signed   By: Lowella Grip III M.D.   On: 05/27/2018 13:25   Ct Angio Chest Pe W/cm &/or Wo Cm  Result Date: 05/22/2018 CLINICAL DATA:  Onset chest  pain last night which has since resolved. History of lymphoma. EXAM: CT ANGIOGRAPHY CHEST WITH CONTRAST TECHNIQUE: Multidetector CT imaging of the chest was performed using the standard protocol during bolus administration of intravenous contrast. Multiplanar CT image reconstructions and MIPs were obtained to evaluate the vascular anatomy. CONTRAST:  100 mL ISOVUE-370 IOPAMIDOL (ISOVUE-370) INJECTION 76% COMPARISON:  PA and lateral chest earlier today. PET CT scan 03/14/2018. FINDINGS: Cardiovascular: No pulmonary embolus is identified. Heart size is upper normal. No pericardial effusion. No atherosclerosis. Port-A-Cath noted. Mediastinum/Nodes: No enlarged mediastinal, hilar, or axillary lymph nodes. Thyroid gland, trachea, and esophagus demonstrate no significant findings. Lungs/Pleura: No pleural effusion. Lungs demonstrate only mild dependent atelectasis. Upper Abdomen: There is partial visualization of cysts in kidneys which are unchanged. Imaged intra-abdominal contents are otherwise unremarkable. Musculoskeletal: No acute or focal abnormality. Review of the MIP images confirms the above findings. IMPRESSION: Negative for pulmonary embolus.  No acute disease. Electronically Signed   By: Inge Rise M.D.   On: 05/22/2018 14:53    Discharge Laboratory Values: . CBC Latest Ref Rng & Units 06/01/2018 05/31/2018 05/30/2018  WBC 4.0 - 10.5 K/uL 4.0 6.0 10.8(H)  Hemoglobin 13.0 - 17.0 g/dL 9.7(L) 9.5(L) 10.3(L)  Hematocrit 39.0 - 52.0 % 30.7(L) 30.4(L) 32.6(L)  Platelets 150 - 400 K/uL 310 300 315   . CMP Latest Ref Rng & Units 06/01/2018 05/31/2018 05/30/2018  Glucose 70 - 99 mg/dL 185(H) 227(H) 233(H)  BUN 8 - 23 mg/dL 15 16 14   Creatinine 0.61 - 1.24 mg/dL 0.74 0.73 0.76  Sodium 135 - 145 mmol/L 139 138 138  Potassium 3.5 - 5.1 mmol/L 3.7 3.7 4.0  Chloride 98 - 111 mmol/L 106 106 106  CO2 22 - 32 mmol/L 26 26 23   Calcium 8.9 - 10.3 mg/dL 9.0 9.2 9.4  Total Protein 6.5 - 8.1 g/dL 5.9(L) 6.1(L)  6.4(L)  Total Bilirubin 0.3 - 1.2 mg/dL 0.7 0.4 0.4  Alkaline Phos 38 - 126 U/L 47 45 58  AST 15 - 41 U/L 18 15 18   ALT 0 - 44 U/L 18 17 17     Brief H and P: For complete details please refer to admission H and P, but in brief,   Jesus [name] is a wonderful 63 y.o. male who has been admitted today for C6 EPOCH-R treatment of his T-Cell/Histocyte Rich Large B-Cell Lymphoma. [name].  The pt reports that he did become dizzy yesterday after taking methocarbamol for about 10 minutes which concerned him enough to present to the ED. He denies the characterization of the room spinning. His ED work up was otherwise unremarkable. The pt notes that his dizziness has since resolved.   The pt denies any leg swelling, and notes that he has been eating well and sleeping well. He endorses good energy levels and denies abdominal pains. He also denies any concerns for infections.   Issues during hospitalization  63 y.o. 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  CT of the abdomen and  pelvis performed on 12/22/2017 showed bulky left inguinal, left hemipelvic, and retroperitoneal adenopathy.   01/02/18 Left inguinal LN Biopsy revealed T-Cell/histocyte rich Large B-Cell Lymphoma  12/27/17 ECHO revealed LV EF of 55-60%   01/05/18 PET/CT revealedMassively enlarged pelvic lymph nodes intense metabolic activity consistent lymphoma. 2. Additional hypermetabolic lymph nodes in the porta hepatis and retroperitoneum LEFT aorta. 3. Solitary hypermetabolic mediastinal lymph node in the upper LEFT Mediastinum. 4. 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 5. Normal spleen.  HIV non reactive on 12/22/2017.Hep C and  hep B serology negative.  03/14/18 PET/CT revealedPET-CT findings suggest an excellent response to chemotherapy. The abdominal lymphadenopathy has near completely resolved and demonstrates a near complete metabolic response. The pelvic and inguinal adenopathy has significantly decreased in size and the metabolic activity has significantly decreased. 2. Diffuse marrow activity likely due to chemotherapy and or marrow stimulating drugs. I do not see any discrete persistent lesions.   04/17/18 CT Head revealedSubtle mesial caudothalamic hypodensities may be artifact though, the could reflect encephalitis or Wernicke's encephalopathy. Consider MRI of the head with and without contrast. 2. Mild chronic small vessel ischemic changes  2) left lower extremity swelling- now resolved Doppler ultrasound for DVT was negative in the left lower extremity.  Likely from venous compression +/- lymphatic obstruction from bulky left inguinal, left hemipelvic, and retroperitoneal adenopathy.   3) S/p Port a cath placement   4)h/oE.coli UTI - Pansensitive -Resolved.Also appearedto have BPH like symptomatology. -Was hospitalized 04/16/18 to 04/20/18 for E.coli sepsis from UTI, treated with antibiotics.  5)s/pE.COLi sepsis - likely from urinary source. Recent h/o E.coli UTI  6) s/p Symptomatic Anemia Hgb 6.7 - due to chemotherapy/sepsis- s/p PRBC transfusion. No overt evidence of bleeding. hgb now stable at 10.6  7) DVT prophylaxis -lovenoxSC in the hospital  8) HTN-was elevated in setting of improved po intake and steroids -improving control -conitnueon lisinopril 20 mg p.o. daily today -Continue Flomax for his BPH. -increasedamlodipine to 10 mgTo optimize BP control -will monitor and optimize rx accordingly.  9) Likely BPH - on flomax  PLAN: -Discussed pt labwork today, 06/01/18; blood counts and chemistries are stable -The pt has no prohibitive toxicities from continuing  C6D5 EPOCH-R at this time.   -Continue eating well, staying hydrated, and staying as active as reasonably possible  -Pt will resume Bactrim on discharged for 3 weeks -Outpatient Rituxan and Neulasta on 06/04/18 -Will complete PET/CT for end of treatment efficacy evaluation  -transfuse prn for hgb<8- hgb stable at9.7at this time. -outpatient clinic f/u on discharge after PT/CT with labs  Physical Exam at Discharge: BP (!) 153/90 (BP Location: Left Arm)   Pulse 65   Temp (!) 97.5 F (36.4 C) (Oral)   Resp 17   Ht 6' (1.829 m)   Wt 258 lb 8 oz (117.3 kg)   SpO2 98%   BMI 35.06 kg/m  . GENERAL:alert, in no acute distress and comfortable SKIN: no acute rashes, no significant lesions EYES: conjunctiva are pink and non-injected, sclera anicteric OROPHARYNX: MMM, no exudates, no oropharyngeal erythema or ulceration NECK: supple, no JVD LYMPH:  no palpable lymphadenopathy in the cervical, axillary or inguinal regions LUNGS: clear to auscultation b/l with normal respiratory effort HEART: regular rate & rhythm ABDOMEN:  normoactive bowel sounds , non tender, not distended. Extremity: no pedal edema PSYCH: alert & oriented x 3 with fluent speech NEURO: no focal motor/sensory deficits  Hospital Course:  Active Problems:   Diffuse large B cell lymphoma (HCC)  Diet:  Regular Diet  Activity:  Infection precautions  Condition at Discharge:   Stable  Signed: Dr. Sullivan Lone MD [place] [phone]  06/01/2018, 12:20 PM   TT spent discharging patient >39mns

## 2018-06-04 ENCOUNTER — Inpatient Hospital Stay: Payer: 59

## 2018-06-04 VITALS — BP 134/70 | HR 85 | Temp 98.3°F | Resp 18

## 2018-06-04 DIAGNOSIS — Z791 Long term (current) use of non-steroidal anti-inflammatories (NSAID): Secondary | ICD-10-CM | POA: Diagnosis not present

## 2018-06-04 DIAGNOSIS — R61 Generalized hyperhidrosis: Secondary | ICD-10-CM | POA: Diagnosis not present

## 2018-06-04 DIAGNOSIS — Z7982 Long term (current) use of aspirin: Secondary | ICD-10-CM | POA: Diagnosis not present

## 2018-06-04 DIAGNOSIS — R600 Localized edema: Secondary | ICD-10-CM | POA: Diagnosis not present

## 2018-06-04 DIAGNOSIS — C8338 Diffuse large B-cell lymphoma, lymph nodes of multiple sites: Secondary | ICD-10-CM

## 2018-06-04 DIAGNOSIS — F17210 Nicotine dependence, cigarettes, uncomplicated: Secondary | ICD-10-CM | POA: Diagnosis not present

## 2018-06-04 DIAGNOSIS — Z5112 Encounter for antineoplastic immunotherapy: Secondary | ICD-10-CM | POA: Diagnosis not present

## 2018-06-04 DIAGNOSIS — I10 Essential (primary) hypertension: Secondary | ICD-10-CM | POA: Diagnosis not present

## 2018-06-04 DIAGNOSIS — N401 Enlarged prostate with lower urinary tract symptoms: Secondary | ICD-10-CM | POA: Diagnosis not present

## 2018-06-04 DIAGNOSIS — Z7689 Persons encountering health services in other specified circumstances: Secondary | ICD-10-CM | POA: Diagnosis not present

## 2018-06-04 DIAGNOSIS — Z7189 Other specified counseling: Secondary | ICD-10-CM

## 2018-06-04 DIAGNOSIS — Z86718 Personal history of other venous thrombosis and embolism: Secondary | ICD-10-CM | POA: Diagnosis not present

## 2018-06-04 DIAGNOSIS — Z79899 Other long term (current) drug therapy: Secondary | ICD-10-CM | POA: Diagnosis not present

## 2018-06-04 DIAGNOSIS — I739 Peripheral vascular disease, unspecified: Secondary | ICD-10-CM | POA: Diagnosis not present

## 2018-06-04 DIAGNOSIS — E785 Hyperlipidemia, unspecified: Secondary | ICD-10-CM | POA: Diagnosis not present

## 2018-06-04 MED ORDER — SODIUM CHLORIDE 0.9 % IV SOLN
Freq: Once | INTRAVENOUS | Status: AC
  Administered 2018-06-04: 15:00:00 via INTRAVENOUS
  Filled 2018-06-04: qty 250

## 2018-06-04 MED ORDER — PEGFILGRASTIM-CBQV 6 MG/0.6ML ~~LOC~~ SOSY
6.0000 mg | PREFILLED_SYRINGE | Freq: Once | SUBCUTANEOUS | Status: AC
  Administered 2018-06-04: 6 mg via SUBCUTANEOUS

## 2018-06-04 MED ORDER — ACETAMINOPHEN 325 MG PO TABS
650.0000 mg | ORAL_TABLET | Freq: Once | ORAL | Status: AC
  Administered 2018-06-04: 650 mg via ORAL

## 2018-06-04 MED ORDER — METHYLPREDNISOLONE SODIUM SUCC 125 MG IJ SOLR
INTRAMUSCULAR | Status: AC
  Filled 2018-06-04: qty 2

## 2018-06-04 MED ORDER — DIPHENHYDRAMINE HCL 25 MG PO CAPS
ORAL_CAPSULE | ORAL | Status: AC
  Filled 2018-06-04: qty 2

## 2018-06-04 MED ORDER — SODIUM CHLORIDE 0.9% FLUSH
10.0000 mL | INTRAVENOUS | Status: DC | PRN
  Administered 2018-06-04: 10 mL
  Filled 2018-06-04: qty 10

## 2018-06-04 MED ORDER — DIPHENHYDRAMINE HCL 25 MG PO CAPS
50.0000 mg | ORAL_CAPSULE | Freq: Once | ORAL | Status: AC
  Administered 2018-06-04: 50 mg via ORAL

## 2018-06-04 MED ORDER — HEPARIN SOD (PORK) LOCK FLUSH 100 UNIT/ML IV SOLN
500.0000 [IU] | Freq: Once | INTRAVENOUS | Status: AC | PRN
  Administered 2018-06-04: 500 [IU]
  Filled 2018-06-04: qty 5

## 2018-06-04 MED ORDER — SODIUM CHLORIDE 0.9 % IV SOLN
375.0000 mg/m2 | Freq: Once | INTRAVENOUS | Status: AC
  Administered 2018-06-04: 1000 mg via INTRAVENOUS
  Filled 2018-06-04: qty 100

## 2018-06-04 MED ORDER — METHYLPREDNISOLONE SODIUM SUCC 125 MG IJ SOLR
80.0000 mg | Freq: Every day | INTRAMUSCULAR | Status: DC
  Administered 2018-06-04: 80 mg via INTRAVENOUS

## 2018-06-04 MED ORDER — PEGFILGRASTIM-CBQV 6 MG/0.6ML ~~LOC~~ SOSY
PREFILLED_SYRINGE | SUBCUTANEOUS | Status: AC
  Filled 2018-06-04: qty 0.6

## 2018-06-04 MED ORDER — ACETAMINOPHEN 325 MG PO TABS
ORAL_TABLET | ORAL | Status: AC
  Filled 2018-06-04: qty 2

## 2018-06-04 NOTE — Patient Instructions (Signed)
[place] Discharge Instructions for Patients Receiving Chemotherapy  Today you received the following chemotherapy agents: Rituxan.  To help prevent nausea and vomiting after your treatment, we encourage you to take your nausea medication as prescribed.   If you develop nausea and vomiting that is not controlled by your nausea medication, call the clinic.   BELOW ARE SYMPTOMS THAT SHOULD BE REPORTED IMMEDIATELY:  *FEVER GREATER THAN 100.5 F  *CHILLS WITH OR WITHOUT FEVER  NAUSEA AND VOMITING THAT IS NOT CONTROLLED WITH YOUR NAUSEA MEDICATION  *UNUSUAL SHORTNESS OF BREATH  *UNUSUAL BRUISING OR BLEEDING  TENDERNESS IN MOUTH AND THROAT WITH OR WITHOUT PRESENCE OF ULCERS  *URINARY PROBLEMS  *BOWEL PROBLEMS  UNUSUAL RASH Items with * indicate a potential emergency and should be followed up as soon as possible.  Feel free to call the clinic should you have any questions or concerns. The clinic phone number is [phone].  Please show the [facility] at check-in to the Emergency Department and triage nurse.   Pegfilgrastim injection What is this medicine? PEGFILGRASTIM (PEG fil gra stim) is a long-acting granulocyte colony-stimulating factor that stimulates the growth of neutrophils, a type of white blood cell important in the body's fight against infection. It is used to reduce the incidence of fever and infection in patients with certain types of cancer who are receiving chemotherapy that affects the bone marrow, and to increase survival after being exposed to high doses of radiation. This medicine may be used for other purposes; ask your health care provider or pharmacist if you have questions. COMMON BRAND NAME(S): Neulasta What should I tell my health care provider before I take this medicine? They need to know if you have any of these conditions: -kidney disease -latex allergy -ongoing radiation therapy -sickle cell disease -skin reactions to  acrylic adhesives (On-Body Injector only) -an unusual or allergic reaction to pegfilgrastim, filgrastim, other medicines, foods, dyes, or preservatives -pregnant or trying to get pregnant -breast-feeding How should I use this medicine? This medicine is for injection under the skin. If you get this medicine at home, you will be taught how to prepare and give the pre-filled syringe or how to use the On-body Injector. Refer to the patient Instructions for Use for detailed instructions. Use exactly as directed. Tell your healthcare provider immediately if you suspect that the On-body Injector may not have performed as intended or if you suspect the use of the On-body Injector resulted in a missed or partial dose. It is important that you put your used needles and syringes in a special sharps container. Do not put them in a trash can. If you do not have a sharps container, call your pharmacist or healthcare provider to get one. Talk to your pediatrician regarding the use of this medicine in children. While this drug may be prescribed for selected conditions, precautions do apply. Overdosage: If you think you have taken too much of this medicine contact a poison control center or emergency room at once. NOTE: This medicine is only for you. Do not share this medicine with others. What if I miss a dose? It is important not to miss your dose. Call your doctor or health care professional if you miss your dose. If you miss a dose due to an On-body Injector failure or leakage, a new dose should be administered as soon as possible using a single prefilled syringe for manual use. What may interact with this medicine? Interactions have not been studied. Give your  health care provider a list of all the medicines, herbs, non-prescription drugs, or dietary supplements you use. Also tell them if you smoke, drink alcohol, or use illegal drugs. Some items may interact with your medicine. This list may not describe all  possible interactions. Give your health care provider a list of all the medicines, herbs, non-prescription drugs, or dietary supplements you use. Also tell them if you smoke, drink alcohol, or use illegal drugs. Some items may interact with your medicine. What should I watch for while using this medicine? You may need blood work done while you are taking this medicine. If you are going to need a MRI, CT scan, or other procedure, tell your doctor that you are using this medicine (On-Body Injector only). What side effects may I notice from receiving this medicine? Side effects that you should report to your doctor or health care professional as soon as possible: -allergic reactions like skin rash, itching or hives, swelling of the face, lips, or tongue -dizziness -fever -pain, redness, or irritation at site where injected -pinpoint red spots on the skin -red or dark-brown urine -shortness of breath or breathing problems -stomach or side pain, or pain at the shoulder -swelling -tiredness -trouble passing urine or change in the amount of urine Side effects that usually do not require medical attention (report to your doctor or health care professional if they continue or are bothersome): -bone pain -muscle pain This list may not describe all possible side effects. Call your doctor for medical advice about side effects. You may report side effects to FDA at 1-800-FDA-1088. Where should I keep my medicine? Keep out of the reach of children. Store pre-filled syringes in a refrigerator between 2 and 8 degrees C (36 and 46 degrees F). Do not freeze. Keep in carton to protect from light. Throw away this medicine if it is left out of the refrigerator for more than 48 hours. Throw away any unused medicine after the expiration date. NOTE: This sheet is a summary. It may not cover all possible information. If you have questions about this medicine, talk to your doctor, pharmacist, or health care  provider.  2018 [employer]vier/Gold Standard (2016-04-21 12:58:03)

## 2018-06-08 NOTE — Progress Notes (Signed)
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 male is here because of left lower extremity edema and lymphadenopathy.  The patient was seen in the emergency room this past Friday for the same issue.  A CT of the abdomen and pelvis was performed showing bulky left inguinal, left hemipelvic, and retroperitoneal adenopathy.  He was referred to [place] from the emergency room for further evaluation.  Doppler ultrasound of the left lower extremity was performed and was negative for DVT. Patient reports that he has been having left lower extremity edema in his left groin and left leg for approximately 1 month.  He states that the swelling in the left groin started to get better but then worsened.  The left lower extremity edema has slowly worsened over time.  Patient denies having fevers and chills.  He reports that he does have night sweats at times.  He reported having headaches approximate 1 [place]  He thinks his headaches are related to not having his blood pressure medication.  His headaches have now resolved.  He denies visual changes.  The patient denies chest pain, shortness of breath and cough.  No nausea, vomiting, constipation, diarrhea.  Denies abdominal pain.  The patient denies recent weight loss and has actually gained weight recently.  Patient denies epistaxis, bleeding gums, hemoptysis, hematuria, but occasionally, and melena.  He reports increased urinary frequency over the past month but no dysuria.  The patient is here for evaluation and discussion of his recent CT and lab findings.  Interval History:   Jesus [name] returns today  for management and evaluation of his T-Cell/histocyte rich Large B-Cell Lymphoma. I last saw the pt on 06/01/18 with C6 EPOCH-R discharge. [name].   The pt reports that he has been taking his bactrim each day since his last discharge. The pt notes that he has had some recent soreness at his port site, but denies any redness locally.   Besides this, the pt has not developed any new concerns. He notes that he has been eating well.  Lab results today (06/11/18) of CBC w/diff and CMP is as follows: all values are WNL except for WBC at 17.3k, RBC at 3.13, HGB at 9.4, HCT at 29.1, RDW at 19.9, PLT at 99k, nRBC at 0.3%, ANC at 10.4k, Monocytes abs at 2.5k, Glucose at 134.  On review of systems, pt reports eating well, good energy levels, and denies skin rashes, fevers, chills, night sweats, urinary symptoms, new tingling or numbness in hands and feet, abdominal pains, leg swelling, and any other symptoms.  MEDICAL HISTORY:  Past Medical History:  Diagnosis Date  . Allergy   . History of kidney stones   . Hyperlipidemia   . Hypertension   . Lymphadenopathy   . Pain, lower leg    Bilateral  . Peripheral arterial disease ([place])   . Pre-diabetes   . Red-green color blindness   . Snores   . Wears glasses     SURGICAL HISTORY: Past Surgical History:  Procedure Laterality Date  . CATARACT EXTRACTION W/  INTRAOCULAR LENS  IMPLANT, BILATERAL    . COLONOSCOPY    . dislodged salava stone    . FRACTURE SURGERY    . HAND ARTHROPLASTY  1995   crushed left hand  . INGUINAL LYMPH NODE BIOPSY Left 01/02/2018   Procedure: LEFT INGUINAL LYMPH NODE BIOPSY;  Surgeon: Rolm Bookbinder, MD;  Location: [facility];  Service: General;  Laterality: Left;  . IR IMAGING GUIDED PORT INSERTION  01/15/2018  . MICROLARYNGOSCOPY Left 01/17/2014   Procedure: MICROLARYNGOSCOPY WITH EXCISION OF THE BIOPSY OF LEFT VOCAL CORD LESION;  Surgeon: Izora Gala, MD;  Location: [facility];  Service: ENT;  Laterality: Left;  . ORIF FOOT FRACTURE  2005   left    SOCIAL HISTORY: Social History   Socioeconomic History  . Marital status: Divorced    Spouse name: Not on file  . Number of children: 3  . Years of education: Not on file  . Highest education level: Not on file  Occupational History  . Not on file  Social Needs  . Financial resource strain: Not on file  . Food insecurity:    Worry: Not on file    Inability: Not on file  . Transportation needs:    Medical: Not on file    Non-medical: Not on file  Tobacco Use  . Smoking status: Current Every Day Smoker    Packs/day: 0.50    Years: 36.00    Pack years: 18.00    Types: Cigarettes  . Smokeless tobacco: Never Used  Substance and Sexual Activity  . Alcohol use: Yes    Alcohol/week: 15.0 standard drinks    Types: 10 Cans of beer, 5 Shots of liquor per week    Comment: weekends  . Drug use: Yes    Types: Cocaine    Comment: reports cocaine usage ~2X/ month; last use 12/26/17  . Sexual activity: Not on file  Lifestyle  . Physical activity:    Days per week: Not on file    Minutes per session: Not on file  . Stress: Not on file  Relationships  . Social connections:    Talks on phone: Not on file    Gets together: Not on file    Attends religious service: Not on file    Active member of club or organization: Not on file    Attends meetings of clubs or organizations: Not on file    Relationship status: Not on file  . Intimate partner violence:    Fear of current or ex partner: Not on file    Emotionally abused: Not on file    Physically abused: Not on file    Forced sexual activity: Not on file  Other Topics Concern  . Not on file  Social History Narrative  . Not on file    FAMILY HISTORY: Family History  Problem Relation Age of Onset  . Breast cancer Mother   . Diabetes Father   . Hypertension Father   . Stroke Father   . Mental illness Sister   . Hypertension Daughter   .  Mental illness Daughter   . Hypertension Brother   . Colon cancer Brother   . Breast cancer Sister     ALLERGIES:  is allergic to bee venom.  MEDICATIONS:  Current Outpatient Medications  Medication Sig Dispense Refill  . amLODipine (NORVASC) 10 MG tablet Take 1 tablet (10 mg total) by mouth daily. 30 tablet 2  . aspirin 81 MG tablet Take 81 mg by  mouth daily.    Marland Kitchen dexamethasone (DECADRON) 4 MG tablet Take 2 tablets (8 mg total) by mouth 2 (two) times daily with a meal. Take two times a day starting the day after chemotherapy for 3 days. 10 tablet 0  . hydrocortisone (ANUSOL-HC) 2.5 % rectal cream Place 1 application rectally 2 (two) times daily as needed for hemorrhoids. (Patient not taking: Reported on 05/28/2018) 30 g 0  . lisinopril (PRINIVIL,ZESTRIL) 20 MG tablet Take 1 tablet (20 mg total) by mouth daily. 30 tablet 1  . Multiple Vitamins-Minerals (MULTIVITAMIN WITH MINERALS) tablet Take 1 tablet by mouth daily.    . prochlorperazine (COMPAZINE) 10 MG tablet Take 1 tablet (10 mg total) by mouth every 6 (six) hours as needed (Nausea or vomiting). (Patient not taking: Reported on 05/28/2018) 30 tablet 1  . sulfamethoxazole-trimethoprim (BACTRIM DS,SEPTRA DS) 800-160 MG tablet Take 1 tablet by mouth daily. 21 tablet 0  . tamsulosin (FLOMAX) 0.4 MG CAPS capsule Take 1 capsule (0.4 mg total) by mouth daily after supper. 30 capsule 2   No current facility-administered medications for this visit.     REVIEW OF SYSTEMS:    A 10+ POINT REVIEW OF SYSTEMS WAS OBTAINED including neurology, dermatology, psychiatry, cardiac, respiratory, lymph, extremities, GI, GU, Musculoskeletal, constitutional, breasts, reproductive, HEENT.  All pertinent positives are noted in the HPI.  All others are negative.   PHYSICAL EXAMINATION: ECOG PERFORMANCE STATUS: 1 - Symptomatic but completely ambulatory  Vitals:   06/11/18 1509  BP: 118/73  Pulse: 100  Resp: 18  Temp: 97.6 F (36.4 C)  SpO2: 99%   Filed  Weights   06/11/18 1509  Weight: 259 lb 14.4 oz (117.9 kg)    GENERAL:alert, in no acute distress and comfortable SKIN: no acute rashes, no significant lesions EYES: conjunctiva are pink and non-injected, sclera anicteric OROPHARYNX: MMM, no exudates, no oropharyngeal erythema or ulceration NECK: supple, no JVD LYMPH:  no palpable lymphadenopathy in the cervical, axillary or inguinal regions LUNGS: clear to auscultation b/l with normal respiratory effort HEART: regular rate & rhythm ABDOMEN:  normoactive bowel sounds , non tender, not distended. No palpable hepatosplenomegaly.  Extremity: no pedal edema PSYCH: alert & oriented x 3 with fluent speech NEURO: no focal motor/sensory deficits   LABORATORY DATA:  I have reviewed the data as listed CBC Latest Ref Rng & Units 06/11/2018 06/01/2018 05/31/2018  WBC 4.0 - 10.5 K/uL 17.3(H) 4.0 6.0  Hemoglobin 13.0 - 17.0 g/dL 9.4(L) 9.7(L) 9.5(L)  Hematocrit 39.0 - 52.0 % 29.1(L) 30.7(L) 30.4(L)  Platelets 150 - 400 K/uL 99(L) 310 300    CMP Latest Ref Rng & Units 06/11/2018 06/01/2018 05/31/2018  Glucose 70 - 99 mg/dL 134(H) 185(H) 227(H)  BUN 8 - 23 mg/dL 11 15 16   Creatinine 0.61 - 1.24 mg/dL 1.19 0.74 0.73  Sodium 135 - 145 mmol/L 141 139 138  Potassium 3.5 - 5.1 mmol/L 4.0 3.7 3.7  Chloride 98 - 111 mmol/L 102 106 106  CO2 22 - 32 mmol/L 31 26 26   Calcium 8.9 - 10.3 mg/dL 9.8 9.0 9.2  Total Protein 6.5 - 8.1 g/dL 6.5 5.9(L) 6.1(L)  Total Bilirubin 0.3 - 1.2 mg/dL 0.4 0.7 0.4  Alkaline Phos 38 - 126 U/L 79 47 45  AST 15 - 41 U/L 21 18 15   ALT 0 - 44 U/L 14 18 17    01/02/18 Left Inguinal LN Bx:   12/26/17 Tissue Flow Cytometry:   12/26/17 Inguinal Core biopsy:    RADIOGRAPHIC STUDIES: I have  personally reviewed the radiological images as listed and agreed with the findings in the report. Dg Chest 2 View  Result Date: 05/22/2018 CLINICAL DATA:  Low back pain, chest pain EXAM: CHEST - 2 VIEW COMPARISON:  04/16/2018 FINDINGS: There  is a right-sided Port-A-Cath with the tip projecting over the SVC. There is no focal parenchymal opacity. There is no pleural effusion or pneumothorax. The heart size is top-normal. The osseous structures are unremarkable. IMPRESSION: No active cardiopulmonary disease. Electronically Signed   By: Kathreen Devoid   On: 05/22/2018 12:04   Ct Head Wo Contrast  Result Date: 05/27/2018 CLINICAL DATA:  Near syncope and dizziness. Large B-cell lymphoma EXAM: CT HEAD WITHOUT CONTRAST TECHNIQUE: Contiguous axial images were obtained from the base of the skull through the vertex without intravenous contrast. COMPARISON:  April 17, 2018 FINDINGS: Brain: The ventricles are normal in size and configuration. There is no intracranial mass, hemorrhage, extra-axial fluid collection, or midline shift. There is mild periventricular small vessel disease. Elsewhere the brain parenchyma appears unremarkable. There is no appreciable acute infarct. Vascular: There is no hyperdense vessel. No appreciable vascular calcification. Skull: The bony calvarium appears intact. Sinuses/Orbits: There is opacification in the posterior right maxillary antrum. There is slight mucosal thickening in several ethmoid air cells. Other visualized paranasal sinuses are clear. Orbits appear symmetric bilaterally. Other: Mastoid air cells are clear. There is debris in the right external auditory canal. IMPRESSION: Mild periventricular small vessel disease. No acute infarct evident. No mass or hemorrhage. There are areas of paranasal sinus disease. There is probable cerumen in the right external auditory canal. Electronically Signed   By: Lowella Grip III M.D.   On: 05/27/2018 13:25   Ct Angio Chest Pe W/cm &/or Wo Cm  Result Date: 05/22/2018 CLINICAL DATA:  Onset chest pain last night which has since resolved. History of lymphoma. EXAM: CT ANGIOGRAPHY CHEST WITH CONTRAST TECHNIQUE: Multidetector CT imaging of the chest was performed using the  standard protocol during bolus administration of intravenous contrast. Multiplanar CT image reconstructions and MIPs were obtained to evaluate the vascular anatomy. CONTRAST:  100 mL ISOVUE-370 IOPAMIDOL (ISOVUE-370) INJECTION 76% COMPARISON:  PA and lateral chest earlier today. PET CT scan 03/14/2018. FINDINGS: Cardiovascular: No pulmonary embolus is identified. Heart size is upper normal. No pericardial effusion. No atherosclerosis. Port-A-Cath noted. Mediastinum/Nodes: No enlarged mediastinal, hilar, or axillary lymph nodes. Thyroid gland, trachea, and esophagus demonstrate no significant findings. Lungs/Pleura: No pleural effusion. Lungs demonstrate only mild dependent atelectasis. Upper Abdomen: There is partial visualization of cysts in kidneys which are unchanged. Imaged intra-abdominal contents are otherwise unremarkable. Musculoskeletal: No acute or focal abnormality. Review of the MIP images confirms the above findings. IMPRESSION: Negative for pulmonary embolus.  No acute disease. Electronically Signed   By: Inge Rise M.D.   On: 05/22/2018 14:53    ASSESSMENT & PLAN:   This is a pleasant 63 y.o. 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  CT of the abdomen and pelvis performed on 12/22/2017 showed bulky left inguinal, left hemipelvic, and retroperitoneal adenopathy.   01/02/18 Left inguinal LN Biopsy revealed T-Cell/histocyte rich Large B-Cell Lymphoma  12/27/17 ECHO revealed LV EF of 55-60%   01/05/18 PET/CT revealedMassively enlarged pelvic lymph nodes intense metabolic activity consistent lymphoma. 2. Additional hypermetabolic lymph nodes in the porta hepatis and  retroperitoneum LEFT aorta. 3. Solitary  hypermetabolic mediastinal lymph node in the upper LEFT Mediastinum. 4. 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 5. Normal spleen.  HIV non reactive on 12/22/2017.Hep C and hep B serology negative.  03/14/18 PET/CT revealedPET-CT findings suggest an excellent response to chemotherapy. The abdominal lymphadenopathy has near completely resolved and demonstrates a near complete metabolic response. The pelvic and inguinal adenopathy has significantly decreased in size and the metabolic activity has significantly decreased. 2. Diffuse marrow activity likely due to chemotherapy and or marrow stimulating drugs. I do not see any discrete persistent lesions.   04/17/18 CT Head revealedSubtle mesial caudothalamic hypodensities may be artifact though, the could reflect encephalitis or Wernicke's encephalopathy. Consider MRI of the head with and without contrast. 2. Mild chronic small vessel ischemic changes  2) left lower extremity swelling- now resolved Doppler ultrasound for DVT was negative in the left lower extremity.  Likely from venous compression +/- lymphatic obstruction from bulky left inguinal, left hemipelvic, and retroperitoneal adenopathy.   3) S/p Port a cath placement   4)h/oE.coli UTI - Pansensitive -Resolved.Also appearedto have BPH like symptomatology. -Was hospitalized 04/16/18 to 04/20/18 for E.coli sepsis from UTI, treated with antibiotics.  5)s/pE.COLi sepsis - likely from urinary source. Recent h/o E.coli UTI  6) s/p Symptomatic Anemia Hgb 6.7 - due to chemotherapy/sepsis- s/p PRBC transfusion. No overt evidence of bleeding. hgb now stable at 10.6  7) DVT prophylaxis -lovenoxSC in the hospital  8) HTN-was elevated in setting of improved po intake and steroids -improving control -conitnueon lisinopril 20 mg p.o. daily today -Continue Flomax for his BPH. -increasedamlodipine to 10 mgTo optimize BP  control -will monitor and optimize rx accordingly.  9) Likely BPH - on flomax  PLAN: -Discussed pt labwork today, 06/11/18; ANC at 10.4k in setting of G-CSF support, HGB at 9.4, PLT at 99k, other blood counts and chemistries are stable  -Proceed with PET/CT on 06/28/18 -Pt has no remaining toxicities from C6 EPOCH-R -Pt will continue Bactrim for 3 weeks after 06/01/18 -transfuse prn for hgb<8- hgb stable at9.7at this time. -Recommended that the pt continue to eat well, drink at least 48-64 oz of water each day, and walk 20-30 minutes each day.  -Will see the pt back in 3 weeks   No orders of the defined types were placed in this encounter.   F/u with PET/CT as scheduled on 2/20 and MD f/u with labs on 2/26   All questions were answered. The patient knows to call the clinic with any problems, questions or concerns.  The total time spent in the appt was 20 minutes and more than 50% was on counseling and direct patient cares.   Sullivan Lone MD Tony AAHIVMS [facility] Hematology/Oncology Physician [facility]  (Office):       [phone] (Work cell):  [phone] (Fax):           [phone]  I, Baldwin [place], am acting as a scribe for Dr. Sullivan Lone.   .I have reviewed the above documentation for accuracy and completeness, and I agree with the above. Brunetta Genera MD

## 2018-06-11 ENCOUNTER — Inpatient Hospital Stay (HOSPITAL_BASED_OUTPATIENT_CLINIC_OR_DEPARTMENT_OTHER): Payer: 59 | Admitting: Hematology

## 2018-06-11 ENCOUNTER — Inpatient Hospital Stay: Payer: 59 | Attending: Oncology

## 2018-06-11 VITALS — BP 118/73 | HR 100 | Temp 97.6°F | Resp 18 | Ht 72.0 in | Wt 259.9 lb

## 2018-06-11 DIAGNOSIS — Z7982 Long term (current) use of aspirin: Secondary | ICD-10-CM

## 2018-06-11 DIAGNOSIS — Z79899 Other long term (current) drug therapy: Secondary | ICD-10-CM | POA: Insufficient documentation

## 2018-06-11 DIAGNOSIS — E785 Hyperlipidemia, unspecified: Secondary | ICD-10-CM | POA: Diagnosis not present

## 2018-06-11 DIAGNOSIS — Z9221 Personal history of antineoplastic chemotherapy: Secondary | ICD-10-CM | POA: Insufficient documentation

## 2018-06-11 DIAGNOSIS — C7951 Secondary malignant neoplasm of bone: Secondary | ICD-10-CM | POA: Insufficient documentation

## 2018-06-11 DIAGNOSIS — K921 Melena: Secondary | ICD-10-CM | POA: Diagnosis not present

## 2018-06-11 DIAGNOSIS — R51 Headache: Secondary | ICD-10-CM | POA: Diagnosis not present

## 2018-06-11 DIAGNOSIS — R590 Localized enlarged lymph nodes: Secondary | ICD-10-CM | POA: Diagnosis not present

## 2018-06-11 DIAGNOSIS — R600 Localized edema: Secondary | ICD-10-CM | POA: Insufficient documentation

## 2018-06-11 DIAGNOSIS — C8338 Diffuse large B-cell lymphoma, lymph nodes of multiple sites: Secondary | ICD-10-CM | POA: Insufficient documentation

## 2018-06-11 DIAGNOSIS — I739 Peripheral vascular disease, unspecified: Secondary | ICD-10-CM

## 2018-06-11 DIAGNOSIS — R5381 Other malaise: Secondary | ICD-10-CM | POA: Diagnosis not present

## 2018-06-11 DIAGNOSIS — I10 Essential (primary) hypertension: Secondary | ICD-10-CM | POA: Insufficient documentation

## 2018-06-11 DIAGNOSIS — F17210 Nicotine dependence, cigarettes, uncomplicated: Secondary | ICD-10-CM

## 2018-06-11 DIAGNOSIS — R5383 Other fatigue: Secondary | ICD-10-CM | POA: Insufficient documentation

## 2018-06-11 LAB — CMP (CANCER CENTER ONLY)
ALT: 14 U/L (ref 0–44)
AST: 21 U/L (ref 15–41)
Albumin: 3.9 g/dL (ref 3.5–5.0)
Alkaline Phosphatase: 79 U/L (ref 38–126)
Anion gap: 8 (ref 5–15)
BUN: 11 mg/dL (ref 8–23)
CO2: 31 mmol/L (ref 22–32)
Calcium: 9.8 mg/dL (ref 8.9–10.3)
Chloride: 102 mmol/L (ref 98–111)
Creatinine: 1.19 mg/dL (ref 0.61–1.24)
GFR, Est AFR Am: >60 mL/min (ref >60)
GFR, Estimated: >60 mL/min (ref >60)
Glucose, Bld: 134 mg/dL — ABNORMAL HIGH (ref 70–99)
Potassium: 4 mmol/L (ref 3.5–5.1)
Sodium: 141 mmol/L (ref 135–145)
Total Bilirubin: 0.4 mg/dL (ref 0.3–1.2)
Total Protein: 6.5 g/dL (ref 6.5–8.1)

## 2018-06-11 LAB — CBC WITH DIFFERENTIAL/PLATELET
Abs Immature Granulocytes: 2.32 10*3/uL — ABNORMAL HIGH (ref 0.00–0.07)
Basophils Absolute: 0.1 10*3/uL (ref 0.0–0.1)
Basophils Relative: 1 %
Eosinophils Absolute: 0.1 10*3/uL (ref 0.0–0.5)
Eosinophils Relative: 1 %
HCT: 29.1 % — ABNORMAL LOW (ref 39.0–52.0)
Hemoglobin: 9.4 g/dL — ABNORMAL LOW (ref 13.0–17.0)
Immature Granulocytes: 13 %
Lymphocytes Relative: 10 %
Lymphs Abs: 1.8 10*3/uL (ref 0.7–4.0)
MCH: 30 pg (ref 26.0–34.0)
MCHC: 32.3 g/dL (ref 30.0–36.0)
MCV: 93 fL (ref 80.0–100.0)
Monocytes Absolute: 2.5 10*3/uL — ABNORMAL HIGH (ref 0.1–1.0)
Monocytes Relative: 15 %
Neutro Abs: 10.4 10*3/uL — ABNORMAL HIGH (ref 1.7–7.7)
Neutrophils Relative %: 60 %
Platelets: 99 10*3/uL — ABNORMAL LOW (ref 150–400)
RBC: 3.13 MIL/uL — ABNORMAL LOW (ref 4.22–5.81)
RDW: 19.9 % — ABNORMAL HIGH (ref 11.5–15.5)
WBC: 17.3 10*3/uL — ABNORMAL HIGH (ref 4.0–10.5)
nRBC: 0.3 % — ABNORMAL HIGH (ref 0.0–0.2)

## 2018-06-11 LAB — SAMPLE TO BLOOD BANK

## 2018-06-12 ENCOUNTER — Telehealth: Payer: Self-pay

## 2018-06-12 NOTE — Telephone Encounter (Signed)
Per 2/3 los already scheduled by Ebony Hail on 1/16

## 2018-06-14 DIAGNOSIS — L603 Nail dystrophy: Secondary | ICD-10-CM | POA: Diagnosis not present

## 2018-06-14 DIAGNOSIS — L608 Other nail disorders: Secondary | ICD-10-CM | POA: Diagnosis not present

## 2018-06-14 DIAGNOSIS — B353 Tinea pedis: Secondary | ICD-10-CM | POA: Diagnosis not present

## 2018-06-14 DIAGNOSIS — L03031 Cellulitis of right toe: Secondary | ICD-10-CM | POA: Diagnosis not present

## 2018-06-14 DIAGNOSIS — L03032 Cellulitis of left toe: Secondary | ICD-10-CM | POA: Diagnosis not present

## 2018-06-14 DIAGNOSIS — L819 Disorder of pigmentation, unspecified: Secondary | ICD-10-CM | POA: Diagnosis not present

## 2018-06-28 ENCOUNTER — Encounter (HOSPITAL_COMMUNITY)
Admission: RE | Admit: 2018-06-28 | Discharge: 2018-06-28 | Disposition: A | Discharge status: Home / Self Care | Payer: 59 | Source: Ambulatory Visit | Attending: Hematology | Admitting: Hematology

## 2018-06-28 DIAGNOSIS — C8338 Diffuse large B-cell lymphoma, lymph nodes of multiple sites: Secondary | ICD-10-CM | POA: Insufficient documentation

## 2018-06-28 DIAGNOSIS — C8512 Unspecified B-cell lymphoma, intrathoracic lymph nodes: Secondary | ICD-10-CM | POA: Diagnosis not present

## 2018-06-28 LAB — GLUCOSE, CAPILLARY: Glucose-Capillary: 124 mg/dL — ABNORMAL HIGH (ref 70–99)

## 2018-06-28 IMAGING — CT NM PET TUM IMG RESTAG (PS) SKULL BASE T - THIGH
7 of 8 series · 18 of 25 positions shown · non-contrast
Comparison: PET-CT [DATE] and [DATE]

CLINICAL DATA: Subsequent treatment strategy for B-cell lymphoma.

EXAM:
NUCLEAR MEDICINE PET SKULL BASE TO THIGH
TECHNIQUE: 13.2 mCi F-18 FDG was injected intravenously. Full-ring PET imaging
was performed from the skull base to thigh after the radiotracer. CT
data was obtained and used for attenuation correction and anatomic
localization.
Fasting blood glucose: 124 mg/dl

[Series 3: pet sk_thigh ac · axial · 5.0mm · 4.07mm/px · z∈[-806,+154]mm · 3 of 241 slices shown]
[im 1/241]
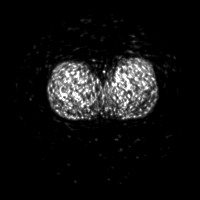
[im 161/241]
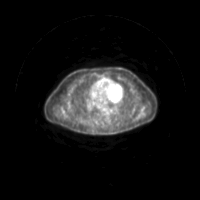
[im 241/241]
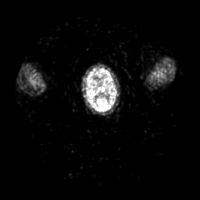

[Series 4: ct sk_thigh 5.0 b31f · axial · 5.0mm · 0.98mm/px · z∈[-806,-86]mm · 3 of 241 slices shown]
[im 1/241]
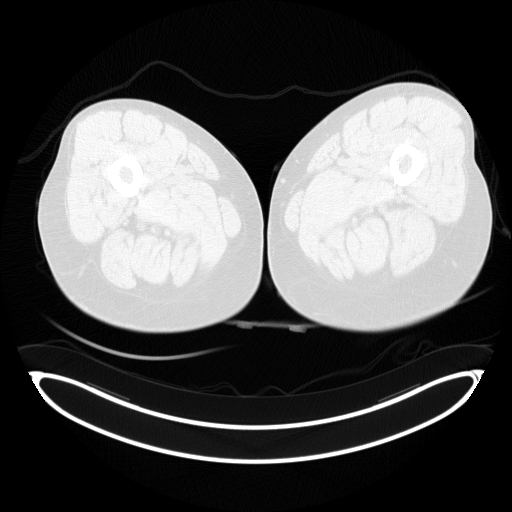
[im 121/241]
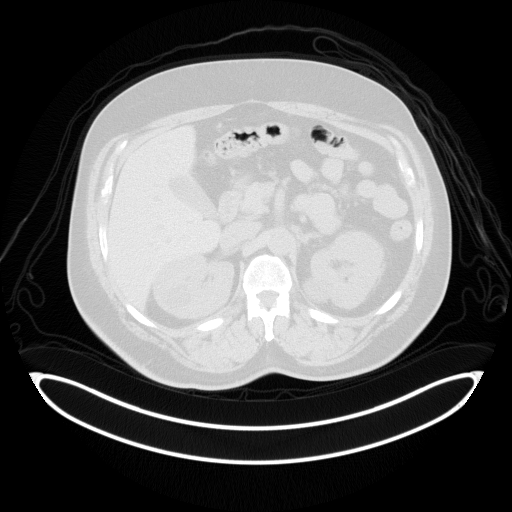
[im 181/241]
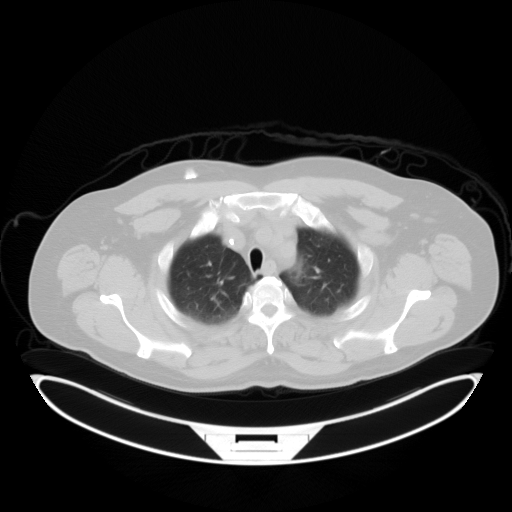

[Series 5: pet sk_thigh nac · axial · 5.0mm · 4.07mm/px · z∈[-806,+154]mm · 4 of 241 slices shown]
[im 1/241]
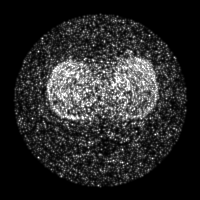
[im 61/241]
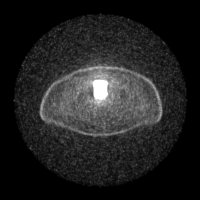
[im 121/241]
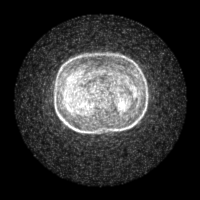
[im 241/241]
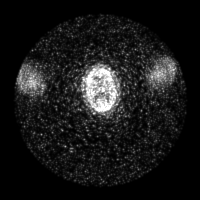

[Series 8: ct sk_thigh 5.0 b70f (id)_bone · axial · 5.0mm · 0.77mm/px · z∈[-304,-24]mm · 2 of 71 slices shown]
[im 1/71  bone]
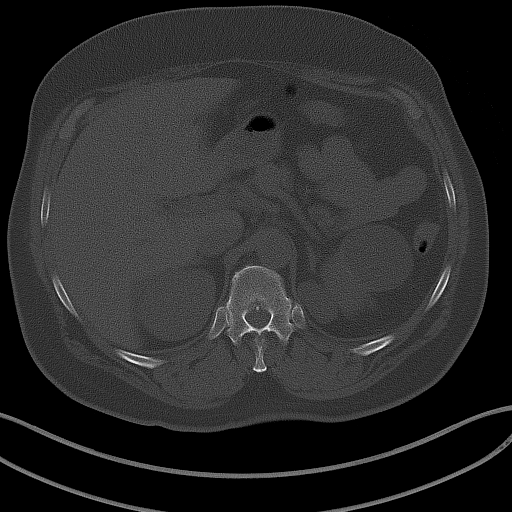
[im 71/71  bone]
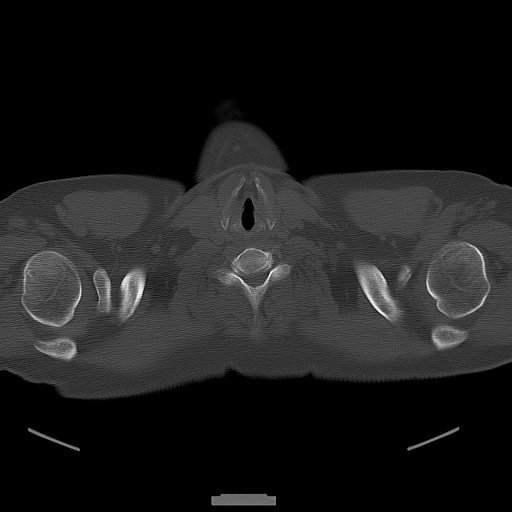

[Series 605: range-ac ct sk_thigh 5.0 hd_fov-cor-<alpha range> · 2 of 92 slices shown]
[im 1/92]
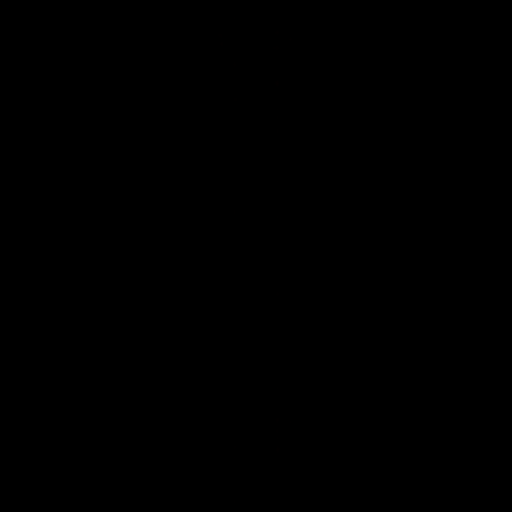
[im 92/92]
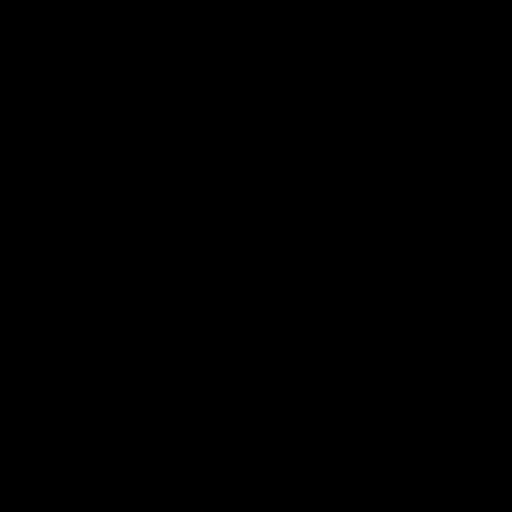

[Series 606: range-ac ct sk_thigh 5.0 hd_fov-tra-<alpha range> · 3 of 230 slices shown]
[im 58/230]
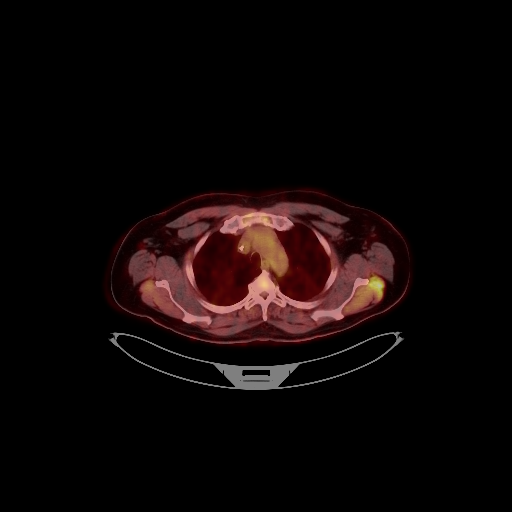
[im 115/230]
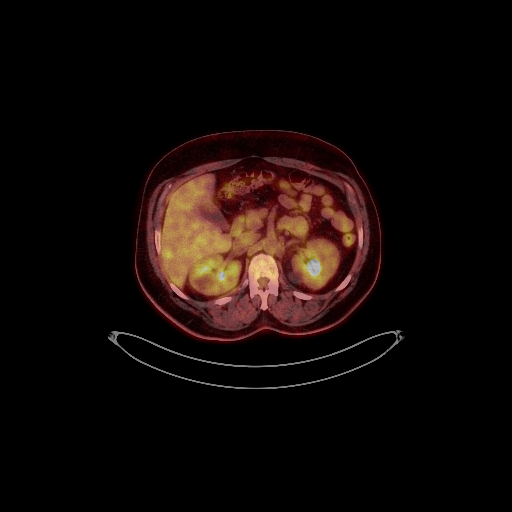
[im 172/230]
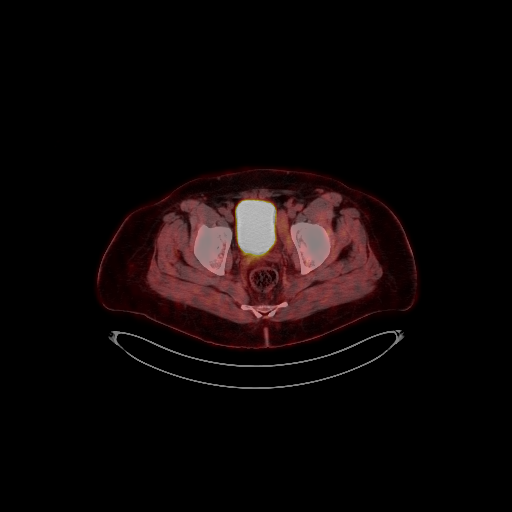

[Series 1376: results mm oncology reading · 5.0mm · 0.45mm/px · 1 of 3 slices shown]
[im 1/3]
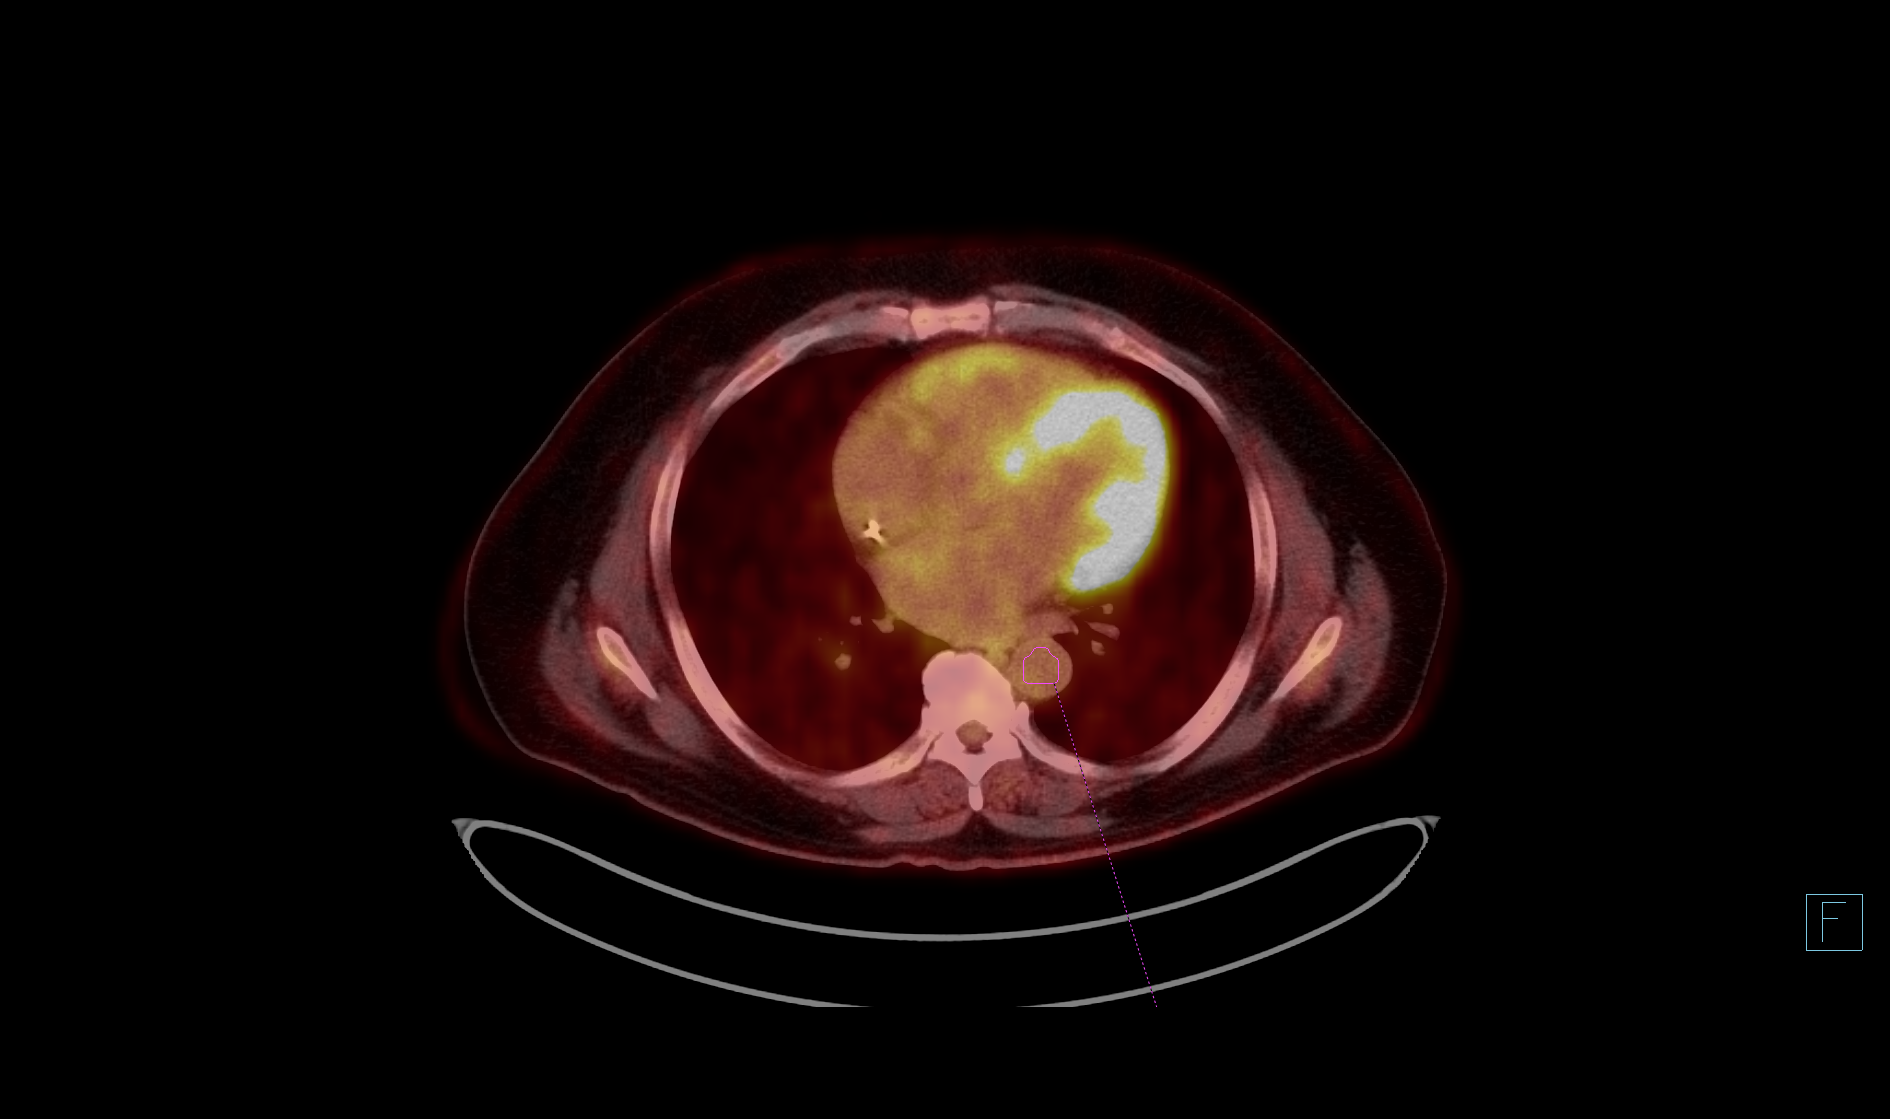

[18 of 25 positions shown; findings below may reference images not displayed]

FINDINGS: Mediastinal blood pool activity: SUV max

NECK: No hypermetabolic lymph nodes in the neck.

Moderate symmetric uptake in both parotid glands likely rib
reflecting inflammation.

Right-sided Port-A-Cath in good position without complicating
features.

Incidental CT findings: none

CHEST: No mediastinal or hilar mass or recurrent adenopathy.

No worrisome pulmonary lesions.

Incidental CT findings: none

ABDOMEN/PELVIS: No residual measurable for hypermetabolic
retroperitoneal lymphadenopathy.

Residual left operator region adenopathy measures maximum short axis
diameter of 19.5 mm and previously measured 37 mm. Small focus of
residual hypermetabolism with SUV max of 7.92. This was previously
5.7.

18 mm left inguinal lymph node previously measured 24 mm. SUV max is
10.09 and was previously 4.65.

The solid abdominal organs appear normal. No worrisome splenic
lesions or splenomegaly.

Incidental CT findings: none

SKELETON: No osseous disease is demonstrated. There is moderate
uptake in the posterior shoulder muscles bilaterally which is likely
due to muscular activity.

Incidental CT findings: none
IMPRESSION: 1. Continued good response to treatment. No residual measurable or
hypermetabolic abdominal lymphadenopathy and no recurrent osseous
disease.
2. Interval decrease in size of the left operator region lymph node
and also the left inguinal lymph node. However, the both have small
foci of slightly increased hypermetabolism which bears surveillance.
3. No new or progressive lymphadenopathy in the neck, chest, abdomen
or pelvis.

## 2018-06-28 MED ORDER — FLUDEOXYGLUCOSE F - 18 (FDG) INJECTION
13.2000 | Freq: Once | INTRAVENOUS | Status: AC | PRN
  Administered 2018-06-28: 13.2 via INTRAVENOUS

## 2018-07-03 NOTE — Progress Notes (Signed)
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 male is here because of left lower extremity edema and lymphadenopathy.  The patient was seen in the emergency room this past Friday for the same issue.  A CT of the abdomen and pelvis was performed showing bulky left inguinal, left hemipelvic, and retroperitoneal adenopathy.  He was referred to [place] from the emergency room for further evaluation.  Doppler ultrasound of the left lower extremity was performed and was negative for DVT. Patient reports that he has been having left lower extremity edema in his left groin and left leg for approximately 1 month.  He states that the swelling in the left groin started to get better but then worsened.  The left lower extremity edema has slowly worsened over time.  Patient denies having fevers and chills.  He reports that he does have night sweats at times.  He reported having headaches approximate 1 [place]  He thinks his headaches are related to not having his blood pressure medication.  His headaches have now resolved.  He denies visual changes.  The patient denies chest pain, shortness of breath and cough.  No nausea, vomiting, constipation, diarrhea.  Denies abdominal pain.  The patient denies recent weight loss and has actually gained weight recently.  Patient denies epistaxis, bleeding gums, hemoptysis, hematuria, but occasionally, and melena.  He reports increased urinary frequency over the past month but no dysuria.  The patient is here for evaluation and discussion of his recent CT and lab findings.  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 The patient's last visit with [place] was on 06/11/18. [name].   The pt reports that he has continued to have some numbness in his fingertips. The pt notes that he is eating well and endorses stable weight. The pt notes that he has not been very active, noting a hurt toe nail as limiting. He will be seeing his podiatrist soon. Otherwise the pt endorses good energy levels and denies any other concerns.  Of note since the patient's last visit, pt has had a PET/CT completed on 06/28/18 with results revealing Continued good response to treatment. No residual measurable or hypermetabolic abdominal lymphadenopathy and no recurrent osseous disease. 2. Interval decrease in size of the left operator region lymph node and also the left inguinal lymph node. However, the both have small foci of slightly increased hypermetabolism which bears surveillance. 3. No new or progressive lymphadenopathy in the neck, chest, abdomen or pelvis.  Lab results today (07/04/18) of CBC w/diff and CMP is as follows: all values are WNL except for RBC at 3.35, HGB at 10.5, HCT at 32.1, RDW at 18.9, Total Bilirubin at 0.2. 07/04/18 LDH is WNL at 167  On review of systems, pt reports eating well, stable weight, numbness in fingertips, low activity levels, and denies leg swelling, and any other symptoms.  MEDICAL HISTORY:  Past Medical History:  Diagnosis Date  . Allergy   .  History of kidney stones   . Hyperlipidemia   . Hypertension   . Lymphadenopathy   . Pain, lower leg    Bilateral  . Peripheral arterial disease ([place])   . Pre-diabetes   . Red-green color blindness   . Snores   . Wears glasses     SURGICAL HISTORY: Past Surgical History:  Procedure Laterality Date  . CATARACT EXTRACTION W/ INTRAOCULAR LENS  IMPLANT, BILATERAL    . COLONOSCOPY    . dislodged salava stone    .  FRACTURE SURGERY    . HAND ARTHROPLASTY  1995   crushed left hand  . INGUINAL LYMPH NODE BIOPSY Left 01/02/2018   Procedure: LEFT INGUINAL LYMPH NODE BIOPSY;  Surgeon: Rolm Bookbinder, MD;  Location: [facility];  Service: General;  Laterality: Left;  . IR IMAGING GUIDED PORT INSERTION  01/15/2018  . MICROLARYNGOSCOPY Left 01/17/2014   Procedure: MICROLARYNGOSCOPY WITH EXCISION OF THE BIOPSY OF LEFT VOCAL CORD LESION;  Surgeon: Izora Gala, MD;  Location: [facility];  Service: ENT;  Laterality: Left;  . ORIF FOOT FRACTURE  2005   left    SOCIAL HISTORY: Social History   Socioeconomic History  . Marital status: Divorced    Spouse name: Not on file  . Number of children: 3  . Years of education: Not on file  . Highest education level: Not on file  Occupational History  . Not on file  Social Needs  . Financial resource strain: Not on file  . Food insecurity:    Worry: Not on file    Inability: Not on file  . Transportation needs:    Medical: Not on file    Non-medical: Not on file  Tobacco Use  . Smoking status: Current Every Day Smoker    Packs/day: 0.50    Years: 36.00    Pack years: 18.00    Types: Cigarettes  . Smokeless tobacco: Never Used  Substance and Sexual Activity  . Alcohol use: Yes    Alcohol/week: 15.0 standard drinks    Types: 10 Cans of beer, 5 Shots of liquor per week    Comment: weekends  . Drug use: Yes    Types: Cocaine    Comment: reports cocaine usage ~2X/ month; last use 12/26/17  . Sexual activity: Not on file  Lifestyle  . Physical activity:    Days per week: Not on file    Minutes per session: Not on file  . Stress: Not on file  Relationships  . Social connections:    Talks on phone: Not on file    Gets together: Not on file    Attends religious service: Not on file    Active member of club or organization: Not on file    Attends meetings of clubs or organizations: Not on file    Relationship status: Not on file  . Intimate  partner violence:    Fear of current or ex partner: Not on file    Emotionally abused: Not on file    Physically abused: Not on file    Forced sexual activity: Not on file  Other Topics Concern  . Not on file  Social History Narrative  . Not on file    FAMILY HISTORY: Family History  Problem Relation Age of Onset  . Breast cancer Mother   . Diabetes Father   . Hypertension Father   . Stroke Father   . Mental illness Sister   . Hypertension Daughter   . Mental illness  Daughter   . Hypertension Brother   . Colon cancer Brother   . Breast cancer Sister     ALLERGIES:  is allergic to bee venom.  MEDICATIONS:  Current Outpatient Medications  Medication Sig Dispense Refill  . amLODipine (NORVASC) 10 MG tablet Take 1 tablet (10 mg total) by mouth daily. 30 tablet 2  . aspirin 81 MG tablet Take 81 mg by mouth daily.    Marland Kitchen dexamethasone (DECADRON) 4 MG tablet Take 2 tablets (8 mg total) by mouth 2 (two) times daily with a meal. Take two times a day starting the day after chemotherapy for 3 days. 10 tablet 0  . hydrocortisone (ANUSOL-HC) 2.5 % rectal cream Place 1 application rectally 2 (two) times daily as needed for hemorrhoids. (Patient not taking: Reported on 05/28/2018) 30 g 0  . lisinopril (PRINIVIL,ZESTRIL) 20 MG tablet Take 1 tablet (20 mg total) by mouth daily. 30 tablet 1  . Multiple Vitamins-Minerals (MULTIVITAMIN WITH MINERALS) tablet Take 1 tablet by mouth daily.    . prochlorperazine (COMPAZINE) 10 MG tablet Take 1 tablet (10 mg total) by mouth every 6 (six) hours as needed (Nausea or vomiting). (Patient not taking: Reported on 05/28/2018) 30 tablet 1  . sulfamethoxazole-trimethoprim (BACTRIM DS,SEPTRA DS) 800-160 MG tablet Take 1 tablet by mouth daily. 21 tablet 0  . tamsulosin (FLOMAX) 0.4 MG CAPS capsule Take 1 capsule (0.4 mg total) by mouth daily after supper. 30 capsule 2   No current facility-administered medications for this visit.     REVIEW OF SYSTEMS:    A  10+ POINT REVIEW OF SYSTEMS WAS OBTAINED including neurology, dermatology, psychiatry, cardiac, respiratory, lymph, extremities, GI, GU, Musculoskeletal, constitutional, breasts, reproductive, HEENT.  All pertinent positives are noted in the HPI.  All others are negative.   PHYSICAL EXAMINATION: ECOG PERFORMANCE STATUS: 1 - Symptomatic but completely ambulatory  Vitals:   07/04/18 1330  BP: 121/70  Pulse: 89  Resp: 18  Temp: 98 F (36.7 C)  SpO2: 100%   Filed Weights   07/04/18 1330  Weight: 266 lb 1.6 oz (120.7 kg)    GENERAL:alert, in no acute distress and comfortable SKIN: no acute rashes, no significant lesions EYES: conjunctiva are pink and non-injected, sclera anicteric OROPHARYNX: MMM, no exudates, no oropharyngeal erythema or ulceration NECK: supple, no JVD LYMPH:  no palpable lymphadenopathy in the cervical, axillary or inguinal regions LUNGS: clear to auscultation b/l with normal respiratory effort HEART: regular rate & rhythm ABDOMEN:  normoactive bowel sounds , non tender, not distended. No palpable hepatosplenomegaly.  Extremity: no pedal edema PSYCH: alert & oriented x 3 with fluent speech NEURO: no focal motor/sensory deficits   LABORATORY DATA:  I have reviewed the data as listed CBC Latest Ref Rng & Units 07/04/2018 06/11/2018 06/01/2018  WBC 4.0 - 10.5 K/uL 6.1 17.3(H) 4.0  Hemoglobin 13.0 - 17.0 g/dL 10.5(L) 9.4(L) 9.7(L)  Hematocrit 39.0 - 52.0 % 32.1(L) 29.1(L) 30.7(L)  Platelets 150 - 400 K/uL 213 99(L) 310    CMP Latest Ref Rng & Units 07/04/2018 06/11/2018 06/01/2018  Glucose 70 - 99 mg/dL 96 134(H) 185(H)  BUN 8 - 23 mg/dL 16 11 15   Creatinine 0.61 - 1.24 mg/dL 1.01 1.19 0.74  Sodium 135 - 145 mmol/L 144 141 139  Potassium 3.5 - 5.1 mmol/L 3.9 4.0 3.7  Chloride 98 - 111 mmol/L 111 102 106  CO2 22 - 32 mmol/L 25 31 26   Calcium 8.9 - 10.3 mg/dL 9.6 9.8 9.0  Total Protein 6.5 -  8.1 g/dL 6.6 6.5 5.9(L)  Total Bilirubin 0.3 - 1.2 mg/dL 0.2(L) 0.4 0.7    Alkaline Phos 38 - 126 U/L 71 79 47  AST 15 - 41 U/L 17 21 18   ALT 0 - 44 U/L 13 14 18    01/02/18 Left Inguinal LN Bx:   12/26/17 Tissue Flow Cytometry:   12/26/17 Inguinal Core biopsy:    RADIOGRAPHIC STUDIES: I have personally reviewed the radiological images as listed and agreed with the findings in the report. Nm Pet Image Restag (ps) Skull Base To Thigh  Result Date: 06/28/2018 CLINICAL DATA:  Subsequent treatment strategy for B-cell lymphoma. EXAM: NUCLEAR MEDICINE PET SKULL BASE TO THIGH TECHNIQUE: 13.2 mCi F-18 FDG was injected intravenously. Full-ring PET imaging was performed from the skull base to thigh after the radiotracer. CT data was obtained and used for attenuation correction and anatomic localization. Fasting blood glucose: 124 mg/dl COMPARISON:  PET-CT 03/14/2018 and 01/05/2018 FINDINGS: Mediastinal blood pool activity: SUV max 2.41 NECK: No hypermetabolic lymph nodes in the neck. Moderate symmetric uptake in both parotid glands likely rib reflecting inflammation. Right-sided Port-A-Cath in good position without complicating features. Incidental CT findings: none CHEST: No mediastinal or hilar mass or recurrent adenopathy. No worrisome pulmonary lesions. Incidental CT findings: none ABDOMEN/PELVIS: No residual measurable for hypermetabolic retroperitoneal lymphadenopathy. Residual left operator region adenopathy measures maximum short axis diameter of 19.5 mm and previously measured 37 mm. Small focus of residual hypermetabolism with SUV max of 7.92. This was previously 5.7. 18 mm left inguinal lymph node previously measured 24 mm. SUV max is 10.09 and was previously 4.65. The solid abdominal organs appear normal. No worrisome splenic lesions or splenomegaly. Incidental CT findings: none SKELETON: No osseous disease is demonstrated. There is moderate uptake in the posterior shoulder muscles bilaterally which is likely due to muscular activity. Incidental CT findings: none  IMPRESSION: 1. Continued good response to treatment. No residual measurable or hypermetabolic abdominal lymphadenopathy and no recurrent osseous disease. 2. Interval decrease in size of the left operator region lymph node and also the left inguinal lymph node. However, the both have small foci of slightly increased hypermetabolism which bears surveillance. 3. No new or progressive lymphadenopathy in the neck, chest, abdomen or pelvis. Electronically Signed   By: Marijo Sanes M.D.   On: 06/28/2018 15:34    ASSESSMENT & PLAN:   This is a pleasant 63 y.o. 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  CT of the abdomen and pelvis performed on 12/22/2017 showed bulky left inguinal, left hemipelvic, and retroperitoneal adenopathy.   01/02/18 Left inguinal LN Biopsy revealed T-Cell/histocyte rich Large B-Cell Lymphoma  12/27/17 ECHO revealed LV EF of 55-60%   01/05/18 PET/CT revealedMassively enlarged pelvic lymph nodes intense metabolic activity consistent lymphoma. 2. Additional hypermetabolic lymph nodes in the porta hepatis and retroperitoneum LEFT aorta. 3. Solitary hypermetabolic mediastinal lymph node in the upper LEFT Mediastinum. 4. 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 5. Normal spleen.  HIV non reactive on 12/22/2017.Hep C and hep B serology negative.  03/14/18 PET/CT revealedPET-CT findings suggest an excellent response to chemotherapy. The abdominal lymphadenopathy has near completely resolved and demonstrates a near complete metabolic response. The pelvic and inguinal adenopathy has significantly decreased in size and the metabolic activity has significantly  decreased. 2. Diffuse marrow  activity likely due to chemotherapy and or marrow stimulating drugs. I do not see any discrete persistent lesions.   04/17/18 CT Head revealedSubtle mesial caudothalamic hypodensities may be artifact though, the could reflect encephalitis or Wernicke's encephalopathy. Consider MRI of the head with and without contrast. 2. Mild chronic small vessel ischemic changes  S/p 6 cycles of EPOCH-R completed on 06/01/18  2) left lower extremity swelling- now resolved Doppler ultrasound for DVT was negative in the left lower extremity.  Likely from venous compression +/- lymphatic obstruction from bulky left inguinal, left hemipelvic, and retroperitoneal adenopathy.   3) S/p Port a cath placement   4)h/oE.coli UTI - Pansensitive -Resolved.Also appearedto have BPH like symptomatology. -Was hospitalized 04/16/18 to 04/20/18 for E.coli sepsis from UTI, treated with antibiotics.  5)s/pE.COLi sepsis - likely from urinary source. Recent h/o E.coli UTI  6) s/p Symptomatic Anemia Hgb 6.7 - due to chemotherapy/sepsis- s/p PRBC transfusion. No overt evidence of bleeding. hgb now stable at 10.6  7) HTN-was elevated in setting of improved po intake and steroids -improving control -conitnueon lisinopril 20 mg p.o. daily today -Continue Flomax for his BPH. -increasedamlodipine to 10 mgTo optimize BP control -will monitor and optimize rx accordingly.  8) Likely BPH - on flomax  PLAN: -Discussed pt labwork today, 07/04/18; WBC normalized to 6.1k, PLT normalized to 213k, HGB improved to 10.5. LDH normal at 167 -Discussed the 06/28/18 PET/CT which revealed Continued good response to treatment. No residual measurable or hypermetabolic abdominal lymphadenopathy and no recurrent osseous disease. 2. Interval decrease in size of the left operator region lymph node and also the left inguinal lymph node. However, the both have small foci of slightly increased  hypermetabolism which bears surveillance. 3. No new or progressive lymphadenopathy in the neck, chest, abdomen or pelvis. -Discussed that I recommend the pt speak with Rad Onc for consideration of consolidative RT, which he is agreeable to -Pt has no remaining toxicities from EPOCH-R -Recommend empiric Vitamin B complex -Will refer the pt to outpatient PT -Continue with port flushes, and will remove port in 3-6 months -Follow up with urology for decision on continuing Flomax as indicated by either enlarged prostate or as a result of previous UTIs -Recommended that the pt continue to eat well, drink at least 48-64 oz of water each day, and walk 20-30 minutes each day.  -Will see the pt back in 6 weeks   Orders Placed This Encounter  Procedures  . CBC with Differential/Platelet    Standing Status:   Future    Standing Expiration Date:   08/08/2019  . CMP ([facility] only)    Standing Status:   Future    Standing Expiration Date:   07/05/2019  . Lactate dehydrogenase    Standing Status:   Future    Standing Expiration Date:   07/05/2019  . Ambulatory referral to Radiation Oncology    Referral Priority:   Urgent    Referral Type:   Consultation    Referral Reason:   Specialty Services Required    Requested Specialty:   Radiation Oncology    Number of Visits Requested:   1  . AMB referral to rehabilitation    Referral Priority:   Routine    Referral Type:   Consultation    Number of Visits Requested:   1    -Referral to radiation oncology (in 1 week) for consideration of consolidative RT for bulky T cell rich B cell lymphoma s/p chemotherapy -refer to cancer rehab for PT -RTC  with Dr Irene Limbo with labs with port flush appointment in 6 weeks   All questions were answered. The patient knows to call the clinic with any problems, questions or concerns.  The total time spent in the appt was 25 minutes and more than 50% was on counseling and direct patient cares.   Sullivan Lone MD [place]  AAHIVMS [facility] Hematology/Oncology Physician [facility]  (Office):       [phone] (Work cell):  [phone] (Fax):           [phone]  I, Baldwin [place], am acting as a scribe for Dr. Sullivan Lone.   .I have reviewed the above documentation for accuracy and completeness, and I agree with the above. Brunetta Genera MD

## 2018-07-04 ENCOUNTER — Telehealth: Payer: Self-pay | Admitting: Hematology

## 2018-07-04 ENCOUNTER — Inpatient Hospital Stay: Payer: 59

## 2018-07-04 ENCOUNTER — Inpatient Hospital Stay (HOSPITAL_BASED_OUTPATIENT_CLINIC_OR_DEPARTMENT_OTHER): Payer: 59 | Admitting: Hematology

## 2018-07-04 VITALS — BP 121/70 | HR 89 | Temp 98.0°F | Resp 18 | Ht 72.0 in | Wt 266.1 lb

## 2018-07-04 DIAGNOSIS — Z79899 Other long term (current) drug therapy: Secondary | ICD-10-CM

## 2018-07-04 DIAGNOSIS — R51 Headache: Secondary | ICD-10-CM

## 2018-07-04 DIAGNOSIS — C8338 Diffuse large B-cell lymphoma, lymph nodes of multiple sites: Secondary | ICD-10-CM

## 2018-07-04 DIAGNOSIS — R5381 Other malaise: Secondary | ICD-10-CM

## 2018-07-04 DIAGNOSIS — Z7982 Long term (current) use of aspirin: Secondary | ICD-10-CM

## 2018-07-04 DIAGNOSIS — I739 Peripheral vascular disease, unspecified: Secondary | ICD-10-CM

## 2018-07-04 DIAGNOSIS — C7951 Secondary malignant neoplasm of bone: Secondary | ICD-10-CM | POA: Diagnosis not present

## 2018-07-04 DIAGNOSIS — R5383 Other fatigue: Secondary | ICD-10-CM

## 2018-07-04 DIAGNOSIS — R590 Localized enlarged lymph nodes: Secondary | ICD-10-CM | POA: Diagnosis not present

## 2018-07-04 DIAGNOSIS — Z9221 Personal history of antineoplastic chemotherapy: Secondary | ICD-10-CM

## 2018-07-04 DIAGNOSIS — E785 Hyperlipidemia, unspecified: Secondary | ICD-10-CM

## 2018-07-04 DIAGNOSIS — F17210 Nicotine dependence, cigarettes, uncomplicated: Secondary | ICD-10-CM

## 2018-07-04 DIAGNOSIS — R600 Localized edema: Secondary | ICD-10-CM

## 2018-07-04 DIAGNOSIS — K921 Melena: Secondary | ICD-10-CM

## 2018-07-04 DIAGNOSIS — I10 Essential (primary) hypertension: Secondary | ICD-10-CM

## 2018-07-04 LAB — CMP (CANCER CENTER ONLY)
ALT: 13 U/L (ref 0–44)
AST: 17 U/L (ref 15–41)
Albumin: 4 g/dL (ref 3.5–5.0)
Alkaline Phosphatase: 71 U/L (ref 38–126)
Anion gap: 8 (ref 5–15)
BUN: 16 mg/dL (ref 8–23)
CO2: 25 mmol/L (ref 22–32)
Calcium: 9.6 mg/dL (ref 8.9–10.3)
Chloride: 111 mmol/L (ref 98–111)
Creatinine: 1.01 mg/dL (ref 0.61–1.24)
GFR, Est AFR Am: >60 mL/min (ref >60)
GFR, Estimated: >60 mL/min (ref >60)
Glucose, Bld: 96 mg/dL (ref 70–99)
Potassium: 3.9 mmol/L (ref 3.5–5.1)
Sodium: 144 mmol/L (ref 135–145)
Total Bilirubin: 0.2 mg/dL — ABNORMAL LOW (ref 0.3–1.2)
Total Protein: 6.6 g/dL (ref 6.5–8.1)

## 2018-07-04 LAB — CBC WITH DIFFERENTIAL/PLATELET
Abs Immature Granulocytes: 0.02 10*3/uL (ref 0.00–0.07)
Basophils Absolute: 0.1 10*3/uL (ref 0.0–0.1)
Basophils Relative: 1 %
Eosinophils Absolute: 0.5 10*3/uL (ref 0.0–0.5)
Eosinophils Relative: 9 %
HCT: 32.1 % — ABNORMAL LOW (ref 39.0–52.0)
Hemoglobin: 10.5 g/dL — ABNORMAL LOW (ref 13.0–17.0)
Immature Granulocytes: 0 %
Lymphocytes Relative: 27 %
Lymphs Abs: 1.6 10*3/uL (ref 0.7–4.0)
MCH: 31.3 pg (ref 26.0–34.0)
MCHC: 32.7 g/dL (ref 30.0–36.0)
MCV: 95.8 fL (ref 80.0–100.0)
Monocytes Absolute: 0.7 10*3/uL (ref 0.1–1.0)
Monocytes Relative: 12 %
Neutro Abs: 3.1 10*3/uL (ref 1.7–7.7)
Neutrophils Relative %: 51 %
Platelets: 213 10*3/uL (ref 150–400)
RBC: 3.35 MIL/uL — ABNORMAL LOW (ref 4.22–5.81)
RDW: 18.9 % — ABNORMAL HIGH (ref 11.5–15.5)
WBC: 6.1 10*3/uL (ref 4.0–10.5)
nRBC: 0 % (ref 0.0–0.2)

## 2018-07-04 LAB — LACTATE DEHYDROGENASE: LDH: 167 U/L (ref 98–192)

## 2018-07-04 NOTE — Telephone Encounter (Signed)
Scheduled per los, declined printout

## 2018-07-06 NOTE — Progress Notes (Signed)
Lymphoma Location(s) / Histology: 01/02/18 Diagnosis Lymph node, biopsy, Left inguinal - T-CELL/HISTIOCYTE RICH LARGE B-CELL LYMPHOMA  Jesus [name] presented months ago with symptoms of: He felt a left groin mass and presented to his doctor.   Biopsies of left inguinal lymph node revealed: T-cell/histiocyte rich large B- cell lymphoma.   Past/Anticipated interventions by medical oncology, if any:  07/04/18 Dr. Irene Limbo  PLAN: -Discussed pt labwork today, 07/04/18; WBC normalized to 6.1k, PLT normalized to 213k, HGB improved to 10.5. LDH normal at 167 -Discussed the 06/28/18 PET/CT which revealed Continued good response to treatment. No residual measurable or hypermetabolic abdominal lymphadenopathy and no recurrent osseous disease. 2. Interval decrease in size of the left operator region lymph node and also the left inguinal lymph node. However, the both have small foci of slightly increased hypermetabolism which bears surveillance. 3. No new or progressive lymphadenopathy in the neck, chest, abdomen or pelvis. -Discussed that I recommend the pt speak with Rad Onc for consideration of consolidative RT, which he is agreeable to -Pt has no remaining toxicities from EPOCH-R -Recommend empiric Vitamin B complex -Will refer the pt to outpatient PT -Continue with port flushes, and will remove port in 3-6 months -Follow up with urology for decision on continuing Flomax as indicated by either enlarged prostate or as a result of previous UTIs -Recommended that the pt continue to eat well, drink at least 48-64 oz of water each day, and walk 20-30 minutes each day.  -Will see the pt back in 6 weeks  Weight changes, if any, over the past 6 months: He reports recent weight loss due to taste changes and a recent cold.   Recurrent fevers, or drenching night sweats, if any:  He reports occasional night sweats.   SAFETY ISSUES:  Prior radiation? No  Pacemaker/ICD? No  Possible current pregnancy? N/A  Is  the patient on methotrexate? No  Current Complaints / other details:    BP 98/63 (BP Location: Right Arm)   Pulse 90   Temp 98 F (36.7 C) (Oral)   Ht 6' (1.829 m)   Wt 261 lb 6.4 oz (118.6 kg)   SpO2 98% Comment: room air  BMI 35.45 kg/m    Wt Readings from Last 3 Encounters:  07/13/18 261 lb 6.4 oz (118.6 kg)  07/04/18 266 lb 1.6 oz (120.7 kg)  06/11/18 259 lb 14.4 oz (117.9 kg)

## 2018-07-11 ENCOUNTER — Encounter: Payer: Self-pay | Admitting: Hematology

## 2018-07-11 ENCOUNTER — Ambulatory Visit: Payer: 59 | Admitting: Radiation Oncology

## 2018-07-11 ENCOUNTER — Ambulatory Visit: Payer: 59

## 2018-07-11 NOTE — Telephone Encounter (Signed)
Contacted patient in response to My chart message. Patient reports cough times several days. Cough decreases with OTC cough medicine. No fever. Encourgaed him to contact PCP to be seen if symptoms worsen or if no better and to also let Dr. Grier Mitts office know. Patient verbalized understanding

## 2018-07-13 ENCOUNTER — Other Ambulatory Visit: Payer: Self-pay

## 2018-07-13 ENCOUNTER — Ambulatory Visit
Admission: RE | Admit: 2018-07-13 | Discharge: 2018-07-13 | Disposition: A | Discharge status: Home / Self Care | Payer: 59 | Source: Ambulatory Visit | Attending: Radiation Oncology | Admitting: Radiation Oncology

## 2018-07-13 ENCOUNTER — Encounter: Payer: Self-pay | Admitting: Radiation Oncology

## 2018-07-13 DIAGNOSIS — G629 Polyneuropathy, unspecified: Secondary | ICD-10-CM | POA: Diagnosis not present

## 2018-07-13 DIAGNOSIS — Z87442 Personal history of urinary calculi: Secondary | ICD-10-CM | POA: Diagnosis not present

## 2018-07-13 DIAGNOSIS — E785 Hyperlipidemia, unspecified: Secondary | ICD-10-CM | POA: Diagnosis not present

## 2018-07-13 DIAGNOSIS — Z800 Family history of malignant neoplasm of digestive organs: Secondary | ICD-10-CM | POA: Insufficient documentation

## 2018-07-13 DIAGNOSIS — Z9221 Personal history of antineoplastic chemotherapy: Secondary | ICD-10-CM | POA: Diagnosis not present

## 2018-07-13 DIAGNOSIS — R634 Abnormal weight loss: Secondary | ICD-10-CM | POA: Diagnosis not present

## 2018-07-13 DIAGNOSIS — R7303 Prediabetes: Secondary | ICD-10-CM | POA: Insufficient documentation

## 2018-07-13 DIAGNOSIS — Z79899 Other long term (current) drug therapy: Secondary | ICD-10-CM | POA: Diagnosis not present

## 2018-07-13 DIAGNOSIS — F17210 Nicotine dependence, cigarettes, uncomplicated: Secondary | ICD-10-CM | POA: Insufficient documentation

## 2018-07-13 DIAGNOSIS — Z7982 Long term (current) use of aspirin: Secondary | ICD-10-CM | POA: Diagnosis not present

## 2018-07-13 DIAGNOSIS — R61 Generalized hyperhidrosis: Secondary | ICD-10-CM | POA: Insufficient documentation

## 2018-07-13 DIAGNOSIS — R600 Localized edema: Secondary | ICD-10-CM | POA: Diagnosis not present

## 2018-07-13 DIAGNOSIS — I739 Peripheral vascular disease, unspecified: Secondary | ICD-10-CM | POA: Diagnosis not present

## 2018-07-13 DIAGNOSIS — I10 Essential (primary) hypertension: Secondary | ICD-10-CM | POA: Diagnosis not present

## 2018-07-13 DIAGNOSIS — Z803 Family history of malignant neoplasm of breast: Secondary | ICD-10-CM | POA: Insufficient documentation

## 2018-07-13 DIAGNOSIS — C8338 Diffuse large B-cell lymphoma, lymph nodes of multiple sites: Secondary | ICD-10-CM | POA: Insufficient documentation

## 2018-07-13 DIAGNOSIS — Z808 Family history of malignant neoplasm of other organs or systems: Secondary | ICD-10-CM | POA: Diagnosis not present

## 2018-07-13 DIAGNOSIS — R1909 Other intra-abdominal and pelvic swelling, mass and lump: Secondary | ICD-10-CM | POA: Diagnosis not present

## 2018-07-13 DIAGNOSIS — R0683 Snoring: Secondary | ICD-10-CM | POA: Insufficient documentation

## 2018-07-13 DIAGNOSIS — R05 Cough: Secondary | ICD-10-CM | POA: Insufficient documentation

## 2018-07-13 DIAGNOSIS — R590 Localized enlarged lymph nodes: Secondary | ICD-10-CM | POA: Diagnosis not present

## 2018-07-13 NOTE — Progress Notes (Signed)
Radiation Oncology         [phone] ________________________________  Initial outpatient Consultation  Name: Jesus [name] MRN: [id number]  Date: 07/13/2018  DOB: Dec 17, 1955  TD:HRCBUL, Gwyndolyn Saxon, MD  Brunetta Genera, MD   REFERRING PHYSICIAN: Brunetta Genera, MD  DIAGNOSIS:    ICD-10-CM   1. Diffuse large B-cell lymphoma of lymph nodes of multiple regions [facility]) C83.38   Cancer Staging Diffuse large B-cell lymphoma of lymph nodes of multiple regions [facility]) Staging form: Hodgkin and Non-Hodgkin Lymphoma, AJCC 8th Edition - Clinical: Stage IV - Signed by Eppie Gibson, MD on 07/13/2018   CHIEF COMPLAINT: Here to discuss management of lymphoma  HISTORY OF PRESENT ILLNESS::Jesus [name] is a 63 y.o. male who presented to his PCP on 12/22/2017 with left lower extremity edema and groin swelling. His PCP referred him to the ED, who performed abdomen/pelvis CT scan with results showing bulky left inguinal, left hemipelvic, and retroperitoneal adenopathy. They also performed left leg doppler ultrasound, and results were negative for DVT. He met with Mikey Bussing, NP and Dr. Irene Limbo on 12/25/2017. He proceeded to tissue flow cytometry and left inguinal core biopsy on 12/26/2017, which revealed concern for a lymphoproliferative process. 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 He was started on 6 cycles of inpatient EPOCH-R on 01/15/2018.   PET scan performed s/p 3 cycles of chemotherapy on  03/14/2018 showing findings suggestive of an excellent response to chemotherapy: the abdominal lymphadenopathy has near completely resolved and demonstrates a near complete metabolic response; the pelvic and inguinal adenopathy has significantly decreased in size and the metabolic activity has significantly decreased. Head CTs performed on 04/17/2018 and 05/27/2018 negative for metastatic disease.  His most recent PET scan on 06/28/2018 showed continued good response to treatment: no residual measurable or hypermetabolic abdominal lymphadenopathy and no recurrent osseous disease; interval decrease in size of the left obturator region lymph node and also the left inguinal lymph node, however both have small foci of slightly increased hypermetabolism; no new or progressive lymphadenopathy in the neck, chest, abdomen, or pelvis.  The patient has kindly been referred to [place] for consideration of consolidative radiation therapy for bulky T cell rich B cell lymphoma s/p chemotherapy.  On review of systems, he reports recent weight loss due to taste changes and recent cold with cough, as well as occasional night sweats. He reports he experienced hair loss, taste changes, and neuropathy to feet and hands related to his chemotherapy. He denies any other symptoms.   PREVIOUS RADIATION THERAPY: No  PAST MEDICAL HISTORY:  has a past medical history of Allergy, History of kidney stones, Hyperlipidemia, Hypertension, Lymphadenopathy, Pain, lower leg, Peripheral arterial disease ([place]), Pre-diabetes, Red-green color blindness, Snores, and Wears glasses.    PAST SURGICAL HISTORY: Past Surgical History:  Procedure Laterality Date  . CATARACT EXTRACTION W/ INTRAOCULAR LENS  IMPLANT, BILATERAL    . COLONOSCOPY    . dislodged salava stone    . FRACTURE SURGERY    . HAND ARTHROPLASTY  1995   crushed left hand  . INGUINAL LYMPH NODE BIOPSY Left 01/02/2018  Procedure: LEFT INGUINAL LYMPH NODE BIOPSY;  Surgeon: Rolm Bookbinder, MD;  Location: [place];  Service: General;  Laterality: Left;  . IR IMAGING GUIDED PORT INSERTION  01/15/2018  . MICROLARYNGOSCOPY Left 01/17/2014   Procedure: MICROLARYNGOSCOPY WITH EXCISION OF THE BIOPSY OF LEFT VOCAL CORD LESION;  Surgeon: Izora Gala, MD;  Location: [place];  Service: ENT;  Laterality: Left;  . ORIF FOOT FRACTURE  2005   left    FAMILY HISTORY: family history includes Breast cancer in his mother and sister; Colon cancer in his brother; Diabetes in his father; Hypertension in his brother, daughter, and father; Mental illness in his daughter and sister; Stroke in his father.  SOCIAL HISTORY:  reports that he has been smoking cigarettes. He has a 18.00 pack-year smoking history. He has never used smokeless tobacco. He reports previous alcohol use of about 15.0 standard drinks of alcohol per week. He reports previous drug use. Drugs:  and Cocaine.  ALLERGIES: Bee venom  MEDICATIONS:  Current Outpatient Medications  Medication Sig Dispense Refill  . amLODipine (NORVASC) 10 MG tablet Take 1 tablet (10 mg total) by mouth daily. 30 tablet 2  . aspirin 81 MG tablet Take 81 mg by mouth daily.    . B-COMPLEX-C PO Take by mouth.    Marland Kitchen lisinopril (PRINIVIL,ZESTRIL) 20 MG tablet Take 1 tablet (20 mg total) by mouth daily. 30 tablet 1  . sulfamethoxazole-trimethoprim (BACTRIM DS,SEPTRA DS) 800-160 MG tablet Take 1 tablet by mouth daily. 21 tablet 0  . tamsulosin (FLOMAX) 0.4 MG CAPS capsule Take 1 capsule (0.4 mg total) by mouth daily after supper. 30 capsule 2  . dexamethasone (DECADRON) 4 MG tablet Take 2 tablets (8 mg total) by mouth 2 (two) times daily with a meal. Take two times a day starting the day after chemotherapy for 3 days. (Patient not taking: Reported on 07/13/2018) 10 tablet 0  . hydrocortisone (ANUSOL-HC) 2.5 % rectal cream Place 1 application rectally 2 (two) times daily as needed for hemorrhoids. (Patient not taking: Reported on 05/28/2018) 30 g 0    . prochlorperazine (COMPAZINE) 10 MG tablet Take 1 tablet (10 mg total) by mouth every 6 (six) hours as needed (Nausea or vomiting). (Patient not taking: Reported on 05/28/2018) 30 tablet 1   No current facility-administered medications for this encounter.     REVIEW OF SYSTEMS:  A 10+ POINT REVIEW OF SYSTEMS WAS OBTAINED including neurology, dermatology, psychiatry, cardiac, respiratory, lymph, extremities, GI, GU, Musculoskeletal, constitutional, breasts, reproductive, HEENT.  All pertinent positives are noted in the HPI.  All others are negative.   PHYSICAL EXAM:  height is 6' (1.829 m) and weight is 261 lb 6.4 oz (118.6 kg). His oral temperature is 98 F (36.7 C). His blood pressure is 98/63 and his pulse is 90. His oxygen saturation is 98%.   General: Alert and oriented, in no acute distress HEENT: Head is normocephalic. Extraocular movements are intact. Oropharynx is clear. Neck: Neck is supple, no palpable cervical or supraclavicular lymphadenopathy. Heart: Regular in rate and rhythm with no murmurs, rubs, or gallops. Chest: Clear to auscultation bilaterally, with decreased breath sounds bilaterally, but no rhonchi, wheezes, or rales. Abdomen: Soft, nontender, nondistended, with no rigidity or guarding. Extremities: No cyanosis. Residual swelling in the left lower extremity but no calf tenderness bilaterally. Lymphatics: see Neck Exam Neurologic: No obvious focalities. Speech is fluent. Coordination is intact. Psychiatric: Judgment and insight are intact. Affect is appropriate.   ECOG = 1  0 -  Asymptomatic (Fully active, able to carry on all predisease activities without restriction)  1 - Symptomatic but completely ambulatory (Restricted in physically strenuous activity but ambulatory and able to carry out work of a light or sedentary nature. For example, light housework, office work)  2 - Symptomatic, <50% in bed during the day (Ambulatory and capable of all self care but unable  to carry out any work activities. Up and about more than 50% of waking hours)  3 - Symptomatic, >50% in bed, but not bedbound (Capable of only limited self-care, confined to bed or chair 50% or more of waking hours)  4 - Bedbound (Completely disabled. Cannot carry on any self-care. Totally confined to bed or chair)  5 - Death   Eustace Pen MM, Creech RH, Tormey DC, et al. [phone]). "Toxicity and response criteria of the [facility]". [place] Oncol. 5 (6): 649-55   LABORATORY DATA:  Lab Results  Component Value Date   WBC 6.1 07/04/2018   HGB 10.5 (L) 07/04/2018   HCT 32.1 (L) 07/04/2018   MCV 95.8 07/04/2018   PLT 213 07/04/2018   CMP     Component Value Date/Time   NA 144 07/04/2018 1311   K 3.9 07/04/2018 1311   CL 111 07/04/2018 1311   CO2 25 07/04/2018 1311   GLUCOSE 96 07/04/2018 1311   BUN 16 07/04/2018 1311   CREATININE 1.01 07/04/2018 1311   CREATININE 1.09 04/29/2014 1930   CALCIUM 9.6 07/04/2018 1311   PROT 6.6 07/04/2018 1311   PROT 6.9 05/30/2016 1651   ALBUMIN 4.0 07/04/2018 1311   ALBUMIN 4.5 05/30/2016 1651   AST 17 07/04/2018 1311   ALT 13 07/04/2018 1311   ALKPHOS 71 07/04/2018 1311   BILITOT 0.2 (L) 07/04/2018 1311   GFRNONAA >60 07/04/2018 1311   GFRNONAA 74 04/29/2014 1930   GFRAA >60 07/04/2018 1311   GFRAA 86 04/29/2014 1930         RADIOGRAPHY: Nm Pet Image Restag (ps) Skull Base To Thigh  Result Date: 06/28/2018 CLINICAL DATA:  Subsequent treatment strategy for B-cell lymphoma. EXAM: NUCLEAR MEDICINE PET SKULL BASE TO THIGH TECHNIQUE: 13.2 mCi F-18 FDG was injected intravenously. Full-ring PET imaging was performed from the skull base to thigh after the radiotracer. CT data was obtained and used for attenuation correction and anatomic localization. Fasting blood glucose: 124 mg/dl COMPARISON:  PET-CT 03/14/2018 and 01/05/2018 FINDINGS: Mediastinal blood pool activity: SUV max 2.41 NECK: No hypermetabolic lymph nodes in the  neck. Moderate symmetric uptake in both parotid glands likely rib reflecting inflammation. Right-sided Port-A-Cath in good position without complicating features. Incidental CT findings: none CHEST: No mediastinal or hilar mass or recurrent adenopathy. No worrisome pulmonary lesions. Incidental CT findings: none ABDOMEN/PELVIS: No residual measurable for hypermetabolic retroperitoneal lymphadenopathy. Residual left operator region adenopathy measures maximum short axis diameter of 19.5 mm and previously measured 37 mm. Small focus of residual hypermetabolism with SUV max of 7.92. This was previously 5.7. 18 mm left inguinal lymph node previously measured 24 mm. SUV max is 10.09 and was previously 4.65. The solid abdominal organs appear normal. No worrisome splenic lesions or splenomegaly. Incidental CT findings: none SKELETON: No osseous disease is demonstrated. There is moderate uptake in the posterior shoulder muscles bilaterally which is likely due to muscular activity. Incidental CT findings: none IMPRESSION: 1. Continued good response to treatment. No residual measurable or hypermetabolic abdominal lymphadenopathy and no recurrent osseous disease. 2. Interval decrease in size of the left operator region  lymph node and also the left inguinal lymph node. However, the both have small foci of slightly increased hypermetabolism which bears surveillance. 3. No new or progressive lymphadenopathy in the neck, chest, abdomen or pelvis. Electronically Signed   By: Marijo Sanes M.D.   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 I recommend consolidative radiation to the left pelvic nodes, ISRT.  It was a pleasure meeting the patient today. We discussed the risks, benefits, and side effects of radiotherapy. No guarantees of treatment were given. A consent form was signed and placed in the patient's medical record. The  patient is enthusiastic about proceeding with treatment. I look forward to participating in the patient's care.  Anticipate about 4 weeks of radiation therapy directed at the pelvis.  We will schedule simulation soon.  __________________________________________   Eppie Gibson, MD  This document serves as a record of services personally performed by Eppie Gibson, MD. It was created on her behalf by Wilburn Mylar, a trained medical scribe. The creation of this record is based on the scribe's personal observations and the provider's statements to them. This document has been checked and approved by the attending provider.

## 2018-07-18 ENCOUNTER — Ambulatory Visit
Admission: RE | Admit: 2018-07-18 | Discharge: 2018-07-18 | Disposition: A | Discharge status: Home / Self Care | Payer: 59 | Source: Ambulatory Visit | Attending: Radiation Oncology | Admitting: Radiation Oncology

## 2018-07-18 ENCOUNTER — Ambulatory Visit: Payer: 59 | Attending: Hematology | Admitting: Physical Therapy

## 2018-07-18 ENCOUNTER — Other Ambulatory Visit: Payer: Self-pay

## 2018-07-18 ENCOUNTER — Encounter: Payer: Self-pay | Admitting: Physical Therapy

## 2018-07-18 DIAGNOSIS — R293 Abnormal posture: Secondary | ICD-10-CM | POA: Diagnosis present

## 2018-07-18 DIAGNOSIS — M6281 Muscle weakness (generalized): Secondary | ICD-10-CM | POA: Insufficient documentation

## 2018-07-18 DIAGNOSIS — R2681 Unsteadiness on feet: Secondary | ICD-10-CM | POA: Diagnosis present

## 2018-07-18 DIAGNOSIS — C8338 Diffuse large B-cell lymphoma, lymph nodes of multiple sites: Secondary | ICD-10-CM | POA: Diagnosis not present

## 2018-07-18 DIAGNOSIS — C8336 Diffuse large B-cell lymphoma, intrapelvic lymph nodes: Secondary | ICD-10-CM

## 2018-07-18 NOTE — Therapy (Signed)
[place], [place], [address] Phone: [phone]   Fax:  [phone]  Physical Therapy Evaluation  Patient Details  Name: Jesus [name] MRN: [id number] Date of Birth: [birth year] Referring Provider (PT): Dr. Irene Limbo    Encounter Date: 07/18/2018  PT End of Session - 07/18/18 1541    Visit Number  1    Number of Visits  9    Date for PT Re-Evaluation  10/06/18   pt will wait til after radiation to start PT    PT Start Time  1450    PT Stop Time  1525    PT Time Calculation (min)  35 min    Activity Tolerance  Patient tolerated treatment well    Behavior During Therapy  [facility] for tasks assessed/performed       Past Medical History:  Diagnosis Date  . Allergy   . History of kidney stones   . Hyperlipidemia   . Hypertension   . Lymphadenopathy   . Pain, lower leg    Bilateral  . Peripheral arterial disease ([place])   . Pre-diabetes   . Red-green color blindness   . Snores   . Wears glasses     Past Surgical History:  Procedure Laterality Date  . CATARACT EXTRACTION W/ INTRAOCULAR LENS  IMPLANT, BILATERAL    . COLONOSCOPY    . dislodged salava stone    . FRACTURE SURGERY    . HAND ARTHROPLASTY  1995   crushed left hand  . INGUINAL LYMPH NODE BIOPSY Left 01/02/2018   Procedure: LEFT INGUINAL LYMPH NODE BIOPSY;  Surgeon: Rolm Bookbinder, MD;  Location: [place];  Service: General;  Laterality: Left;  . IR IMAGING GUIDED PORT INSERTION  01/15/2018  . MICROLARYNGOSCOPY Left 01/17/2014   Procedure: MICROLARYNGOSCOPY WITH EXCISION OF THE BIOPSY OF LEFT VOCAL CORD LESION;  Surgeon: Izora Gala, MD;  Location: [place];  Service: ENT;  Laterality: Left;  . ORIF FOOT FRACTURE  2005   left    There were no vitals filed for this visit.   Subjective Assessment - 07/18/18 1456    Subjective  Pt had CT simulation today and will have radiation for 4 weeks starting Weds the 18 .  He feels  that he is not his "normal "  He has numbness in hands and feet  after the chemo and he sometimes loses his balance  He can't stand a long time like he used to     Pertinent History  Stage IV Diffuse large cell lymphoma diagnosed 12/23/2018 with LLE adn groin pain and swelling. He had 6 cycles of chemo and is getting ready for radiation     Currently in Pain?  No/denies         [facility] PT Assessment - 07/18/18 0001      Assessment   Medical Diagnosis  stage IV lymphoma     Referring Provider (PT)  Dr. Irene Limbo     Onset Date/Surgical Date  12/22/17    Hand Dominance  Right   uses both hands      Precautions   Precautions  None      Restrictions   Weight Bearing Restrictions  No      Balance Screen   Has the patient fallen in the past 6 months  No    Has the patient had a decrease in activity level because of a fear of falling?   No    Is the patient reluctant  to leave their home because of a fear of falling?   No      Home Social worker  Private residence    Living Arrangements  Children    Available Help at Discharge  Available PRN/intermittently      Prior Function   Level of [place]  Retired    Leisure  does not regularly exercise would like to return to dancing      Cognition   Overall Cognitive Status  Within Functional Limits for tasks assessed      Sensation   Additional Comments  pt reports altered sensation in his hands and feet.        Functional Tests   Functional tests  Sit to Stand      Sit to Stand   Comments  15 repetitions in 30 sec.      Posture/Postural Control   Posture/Postural Control  Postural limitations    Postural Limitations  Rounded Shoulders;Forward head;Increased lumbar lordosis      ROM / Strength   AROM / PROM / Strength  AROM;Strength      AROM   Overall AROM   Within functional limits for tasks performed      Strength   Right/Left hand  Right;Left    Right Hand Grip (lbs)  80/80/85    63 age matched norm for rt hand    Left Hand Grip (lbs)  95/95/95   63 is age matched norm for Left hand      Ambulation/Gait   Gait velocity  4.19 sec or 1.2 sec     Gait Comments  1.2 sec is interpreted as able to safely cross streets       Standardized Balance Assessment   Standardized Balance Assessment  Berg Balance Test;Timed Up and Go Test      Berg Balance Test   Sit to Stand  Able to stand without using hands and stabilize independently    Standing Unsupported  Able to stand safely 2 minutes    Sitting with Back Unsupported but Feet Supported on Floor or Stool  Able to sit safely and securely 2 minutes    Stand to Sit  Sits safely with minimal use of hands    Transfers  Able to transfer safely, minor use of hands    Standing Unsupported with Eyes Closed  Able to stand 10 seconds safely    Standing Unsupported with Feet Together  Able to place feet together independently and stand 1 minute safely    From Standing, Reach Forward with Outstretched Arm  Can reach forward >12 cm safely (5")    From Standing Position, Pick up Object from Floor  Able to pick up shoe safely and easily    From Standing Position, Turn to Look Behind Over each Shoulder  Looks behind from both sides and weight shifts well    Turn 360 Degrees  Able to turn 360 degrees safely in 4 seconds or less    Standing Unsupported, Alternately Place Feet on Step/Stool  Able to stand independently and complete 8 steps >20 seconds    Standing Unsupported, One Foot in [employer] balance while stepping or standing    Standing on One Leg  Unable to try or needs assist to prevent fall    Total Score  46    Berg comment:  pt has difficutly standing on one leg to lift leg up onto step , tandem stand and one  legged standing      Timed Up and Go Test   TUG  Normal TUG    Normal TUG (seconds)  8.19    TUG Comments  63 +/- 0.9 sec so pt is WNL so pt is WNL                 Objective measurements  completed on examination: See above findings.                   PT Long Term Goals - 07/18/18 1554      PT LONG TERM GOAL #1   Title  Pt will be independent in a home exercise program for strengthening and endurance     Time  4    Period  Weeks    Status  New      PT LONG TERM GOAL #2   Title  Pt will increase BERG score  to 50 inicating an improvement in balance     Baseline  46    Time  4    Period  Weeks    Status  New      PT LONG TERM GOAL #3   Title  Pt will report he is able to stand for longer periods of time without fatigue as he does his activities of daily living     Time  4    Period  Weeks    Status  New             Plan - 07/18/18 1542    Clinical Impression Statement  Pt is a 63 yo male who has completed chemotherapy for Stage IV lymphoma and will be starting radiation therapy next week.  He has WNL grip strength and chair stands, but has diffciulty with balance testing involving narrow base of support and one legged standing activities that would put him at risk to fall, He would benefit from PT to work on advanced balance activites with LE strengthening especially to his hips and ankles but wants to wait until he has completed radiation theapy on April 19     Personal Factors and Comorbidities  Comorbidity 1    Comorbidities  lymphoma with chemotherapy     Examination-Activity Limitations  Locomotion Level;Stand    Examination-Participation Restrictions  Other   will be having radiation 5 days a week, so wants to postpone PT til after it is complete    Stability/Clinical Decision Making  Evolving/Moderate complexity   will be having radiation therapy    Clinical Decision Making  Moderate    Rehab Potential  Good    PT Frequency  2x / week    PT Duration  4 weeks    PT Treatment/Interventions  ADLs/Self Care Home Management;Stair training;Gait training;Therapeutic activities;Therapeutic exercise;DME Instruction;Functional mobility  training;Balance training;Neuromuscular re-education;Patient/family education    PT Next Visit Plan  Reassess balance tests, begin LE strengthening, endurance training advanced gait     Consulted and Agree with Plan of Care  Patient       Patient will benefit from skilled therapeutic intervention in order to improve the following deficits and impairments:  Postural dysfunction, Decreased mobility, Decreased activity tolerance, Decreased endurance, Difficulty walking, Decreased balance, Impaired sensation  Visit Diagnosis: Unsteadiness on feet - Plan: PT plan of care cert/re-cert  Abnormal posture - Plan: PT plan of care cert/re-cert  Muscle weakness (generalized) - Plan: PT plan of care cert/re-cert     Problem List Patient Active Problem List   Diagnosis Date  Noted  . Large cell (diffuse) non-Hodgkin's lymphoma ([place]) 05/10/2018  . Bacteremia due to Escherichia coli   . HTN (hypertension) 04/17/2018  . RLS (restless legs syndrome) 04/17/2018  . Abnormal LFTs 04/17/2018  . Acute metabolic encephalopathy 93/73/4287  . AKI (acute kidney injury) ([place]) 04/16/2018  . Dehydration   . Fever, unspecified   . Sepsis ([place])   . Disorientation   . Hypokalemia   . Hypomagnesemia   . Anemia   . Encounter for antineoplastic chemotherapy   . At high risk of tumor lysis syndrome   . Swelling of lower leg   . Diffuse large B cell lymphoma ([place]) 01/15/2018  . 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[place]) 01/12/2018  . Counseling regarding advance care planning and goals of care 01/12/2018  . Bilateral leg pain 05/27/2014   Donato Heinz. Owens Shark PT  Norwood Levo 07/18/2018, 3:58 PM  [place], [place], [address] Phone: [phone]   Fax:  [phone]  Name: JADORE VEALS MRN: [id number] Date of Birth: [birth year]

## 2018-07-18 NOTE — Progress Notes (Signed)
Disability paperwork completed and taken to front desk of cancer center to be picked up per patient request.

## 2018-07-18 NOTE — Progress Notes (Signed)
  Radiation Oncology         [phone] ________________________________  Name: Jesus [name] MRN: [id number]  Date: 07/18/2018  DOB: December 17, 1955  SIMULATION AND TREATMENT PLANNING NOTE  Outpatient  DIAGNOSIS:     ICD-10-CM   1. 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 Diffuse large B-cell lymphoma of lymph nodes of multiple regions [facility]) C83.38     NARRATIVE:  The patient was brought to the [place].  Identity was confirmed.  All relevant records and images related to the planned course of therapy were reviewed.  The patient freely provided informed written consent to proceed with treatment after reviewing the details related to the planned course of therapy. The consent form was witnessed and verified by the simulation staff.    Then, the patient was set-up in a stable reproducible  supine position for radiation therapy.  CT images were obtained.  Surface markings were placed.  The CT images were loaded into the planning software.    TREATMENT PLANNING NOTE: Treatment planning then occurred.  The radiation prescription was entered and confirmed.    A total of 1 medically necessary complex treatment devices were fabricated and supervised by me, in the form of custom fitted vaclock. MORE FIELDS WITH MLCs MAY BE ADDED IN DOSIMETRY for dose homogeneity.  I have requested : Intensity Modulated Radiotherapy (IMRT) is medically necessary for this case for the following reason:  Rectal sparing and bladder sparing.  The patient will receive 39.6 Gy in 22 fractions to the pelvic masses.   -----------------------------------  Eppie Gibson, MD

## 2018-07-24 DIAGNOSIS — R2681 Unsteadiness on feet: Secondary | ICD-10-CM | POA: Diagnosis not present

## 2018-07-24 DIAGNOSIS — C8338 Diffuse large B-cell lymphoma, lymph nodes of multiple sites: Secondary | ICD-10-CM | POA: Diagnosis not present

## 2018-07-25 ENCOUNTER — Other Ambulatory Visit: Payer: Self-pay

## 2018-07-25 ENCOUNTER — Ambulatory Visit
Admission: RE | Admit: 2018-07-25 | Discharge: 2018-07-25 | Disposition: A | Discharge status: Home / Self Care | Payer: 59 | Source: Ambulatory Visit | Attending: Radiation Oncology | Admitting: Radiation Oncology

## 2018-07-25 DIAGNOSIS — C8338 Diffuse large B-cell lymphoma, lymph nodes of multiple sites: Secondary | ICD-10-CM | POA: Diagnosis not present

## 2018-07-25 DIAGNOSIS — R2681 Unsteadiness on feet: Secondary | ICD-10-CM | POA: Diagnosis not present

## 2018-07-25 NOTE — Progress Notes (Signed)
Pt here for patient teaching.  Pt given Radiation and You booklet and skin care instructions.  Reviewed areas of pertinence such as diarrhea, fatigue, hair loss, nausea and vomiting, sexual and fertility changes, skin changes and urinary and bladder changes . Pt able to give teach back of to pat skin, use unscented/gentle soap, use baby wipes, have Imodium on hand and drink plenty of water,. Pt demonstrated understanding, needs reinforcement, no evidence of learning, refused teaching and  of information given and will contact nursing with any questions or concerns.     Http://rtanswers.org/treatmentinformation/whattoexpect/index

## 2018-07-26 ENCOUNTER — Ambulatory Visit
Admission: RE | Admit: 2018-07-26 | Discharge: 2018-07-26 | Disposition: A | Discharge status: Home / Self Care | Payer: 59 | Source: Ambulatory Visit | Attending: Radiation Oncology | Admitting: Radiation Oncology

## 2018-07-26 DIAGNOSIS — R2681 Unsteadiness on feet: Secondary | ICD-10-CM | POA: Diagnosis not present

## 2018-07-26 DIAGNOSIS — C8338 Diffuse large B-cell lymphoma, lymph nodes of multiple sites: Secondary | ICD-10-CM | POA: Diagnosis not present

## 2018-07-27 ENCOUNTER — Other Ambulatory Visit: Payer: Self-pay

## 2018-07-27 ENCOUNTER — Ambulatory Visit
Admission: RE | Admit: 2018-07-27 | Discharge: 2018-07-27 | Disposition: A | Discharge status: Home / Self Care | Payer: 59 | Source: Ambulatory Visit | Attending: Radiation Oncology | Admitting: Radiation Oncology

## 2018-07-27 DIAGNOSIS — R2681 Unsteadiness on feet: Secondary | ICD-10-CM | POA: Diagnosis not present

## 2018-07-30 ENCOUNTER — Ambulatory Visit
Admission: RE | Admit: 2018-07-30 | Discharge: 2018-07-30 | Disposition: A | Discharge status: Home / Self Care | Payer: 59 | Source: Ambulatory Visit | Attending: Radiation Oncology | Admitting: Radiation Oncology

## 2018-07-30 ENCOUNTER — Other Ambulatory Visit: Payer: Self-pay

## 2018-07-30 DIAGNOSIS — R2681 Unsteadiness on feet: Secondary | ICD-10-CM | POA: Diagnosis not present

## 2018-07-30 DIAGNOSIS — C8338 Diffuse large B-cell lymphoma, lymph nodes of multiple sites: Secondary | ICD-10-CM | POA: Diagnosis not present

## 2018-07-31 ENCOUNTER — Ambulatory Visit
Admission: RE | Admit: 2018-07-31 | Discharge: 2018-07-31 | Disposition: A | Discharge status: Home / Self Care | Payer: 59 | Source: Ambulatory Visit | Attending: Radiation Oncology | Admitting: Radiation Oncology

## 2018-07-31 ENCOUNTER — Other Ambulatory Visit: Payer: Self-pay | Admitting: Hematology

## 2018-07-31 ENCOUNTER — Other Ambulatory Visit: Payer: Self-pay

## 2018-07-31 DIAGNOSIS — R2681 Unsteadiness on feet: Secondary | ICD-10-CM | POA: Diagnosis not present

## 2018-07-31 DIAGNOSIS — C8338 Diffuse large B-cell lymphoma, lymph nodes of multiple sites: Secondary | ICD-10-CM | POA: Diagnosis not present

## 2018-08-01 ENCOUNTER — Telehealth: Payer: Self-pay | Admitting: *Deleted

## 2018-08-01 ENCOUNTER — Other Ambulatory Visit: Payer: Self-pay | Admitting: Hematology

## 2018-08-01 ENCOUNTER — Other Ambulatory Visit: Payer: Self-pay

## 2018-08-01 ENCOUNTER — Ambulatory Visit
Admission: RE | Admit: 2018-08-01 | Discharge: 2018-08-01 | Disposition: A | Discharge status: Home / Self Care | Payer: 59 | Source: Ambulatory Visit | Attending: Radiation Oncology | Admitting: Radiation Oncology

## 2018-08-01 DIAGNOSIS — C8338 Diffuse large B-cell lymphoma, lymph nodes of multiple sites: Secondary | ICD-10-CM | POA: Diagnosis not present

## 2018-08-01 DIAGNOSIS — R2681 Unsteadiness on feet: Secondary | ICD-10-CM | POA: Diagnosis not present

## 2018-08-01 NOTE — Telephone Encounter (Signed)
Spoke with patient. 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 Patient verbalized understanding.

## 2018-08-02 ENCOUNTER — Ambulatory Visit
Admission: RE | Admit: 2018-08-02 | Discharge: 2018-08-02 | Disposition: A | Discharge status: Home / Self Care | Payer: 59 | Source: Ambulatory Visit | Attending: Radiation Oncology | Admitting: Radiation Oncology

## 2018-08-02 ENCOUNTER — Other Ambulatory Visit: Payer: Self-pay

## 2018-08-02 DIAGNOSIS — C8338 Diffuse large B-cell lymphoma, lymph nodes of multiple sites: Secondary | ICD-10-CM | POA: Diagnosis not present

## 2018-08-02 DIAGNOSIS — R2681 Unsteadiness on feet: Secondary | ICD-10-CM | POA: Diagnosis not present

## 2018-08-03 ENCOUNTER — Other Ambulatory Visit: Payer: Self-pay

## 2018-08-03 ENCOUNTER — Ambulatory Visit
Admission: RE | Admit: 2018-08-03 | Discharge: 2018-08-03 | Disposition: A | Discharge status: Home / Self Care | Payer: 59 | Source: Ambulatory Visit | Attending: Radiation Oncology | Admitting: Radiation Oncology

## 2018-08-03 DIAGNOSIS — C8338 Diffuse large B-cell lymphoma, lymph nodes of multiple sites: Secondary | ICD-10-CM | POA: Diagnosis not present

## 2018-08-03 DIAGNOSIS — R2681 Unsteadiness on feet: Secondary | ICD-10-CM | POA: Diagnosis not present

## 2018-08-06 ENCOUNTER — Other Ambulatory Visit: Payer: Self-pay

## 2018-08-06 ENCOUNTER — Ambulatory Visit
Admission: RE | Admit: 2018-08-06 | Discharge: 2018-08-06 | Disposition: A | Discharge status: Home / Self Care | Payer: 59 | Source: Ambulatory Visit | Attending: Radiation Oncology | Admitting: Radiation Oncology

## 2018-08-06 DIAGNOSIS — R2681 Unsteadiness on feet: Secondary | ICD-10-CM | POA: Diagnosis not present

## 2018-08-06 DIAGNOSIS — C8338 Diffuse large B-cell lymphoma, lymph nodes of multiple sites: Secondary | ICD-10-CM | POA: Diagnosis not present

## 2018-08-07 ENCOUNTER — Other Ambulatory Visit: Payer: Self-pay

## 2018-08-07 ENCOUNTER — Ambulatory Visit
Admission: RE | Admit: 2018-08-07 | Discharge: 2018-08-07 | Disposition: A | Discharge status: Home / Self Care | Payer: 59 | Source: Ambulatory Visit | Attending: Radiation Oncology | Admitting: Radiation Oncology

## 2018-08-07 DIAGNOSIS — C8338 Diffuse large B-cell lymphoma, lymph nodes of multiple sites: Secondary | ICD-10-CM | POA: Diagnosis not present

## 2018-08-07 DIAGNOSIS — R2681 Unsteadiness on feet: Secondary | ICD-10-CM | POA: Diagnosis not present

## 2018-08-08 ENCOUNTER — Other Ambulatory Visit: Payer: Self-pay

## 2018-08-08 ENCOUNTER — Ambulatory Visit
Admission: RE | Admit: 2018-08-08 | Discharge: 2018-08-08 | Disposition: A | Discharge status: Home / Self Care | Payer: 59 | Source: Ambulatory Visit | Attending: Radiation Oncology | Admitting: Radiation Oncology

## 2018-08-08 DIAGNOSIS — C8338 Diffuse large B-cell lymphoma, lymph nodes of multiple sites: Secondary | ICD-10-CM | POA: Diagnosis present

## 2018-08-08 DIAGNOSIS — B20 Human immunodeficiency virus [HIV] disease: Secondary | ICD-10-CM | POA: Diagnosis not present

## 2018-08-08 DIAGNOSIS — Z9221 Personal history of antineoplastic chemotherapy: Secondary | ICD-10-CM | POA: Diagnosis not present

## 2018-08-08 DIAGNOSIS — Z510 Encounter for antineoplastic radiation therapy: Secondary | ICD-10-CM | POA: Insufficient documentation

## 2018-08-08 DIAGNOSIS — D649 Anemia, unspecified: Secondary | ICD-10-CM | POA: Diagnosis not present

## 2018-08-09 ENCOUNTER — Ambulatory Visit
Admission: RE | Admit: 2018-08-09 | Discharge: 2018-08-09 | Disposition: A | Discharge status: Home / Self Care | Payer: 59 | Source: Ambulatory Visit | Attending: Radiation Oncology | Admitting: Radiation Oncology

## 2018-08-09 ENCOUNTER — Encounter: Payer: Self-pay | Admitting: General Practice

## 2018-08-09 ENCOUNTER — Other Ambulatory Visit: Payer: Self-pay

## 2018-08-09 DIAGNOSIS — Z510 Encounter for antineoplastic radiation therapy: Secondary | ICD-10-CM | POA: Diagnosis not present

## 2018-08-09 DIAGNOSIS — C8338 Diffuse large B-cell lymphoma, lymph nodes of multiple sites: Secondary | ICD-10-CM | POA: Diagnosis not present

## 2018-08-09 NOTE — Progress Notes (Signed)
[place] Team contacted patient to assess for food insecurity and other psychosocial needs during current COVID19 pandemic.  Unable to reach, left VM w my contact information.    Beverely Pace, [place]

## 2018-08-10 ENCOUNTER — Ambulatory Visit
Admission: RE | Admit: 2018-08-10 | Discharge: 2018-08-10 | Disposition: A | Discharge status: Home / Self Care | Payer: 59 | Source: Ambulatory Visit | Attending: Radiation Oncology | Admitting: Radiation Oncology

## 2018-08-10 ENCOUNTER — Other Ambulatory Visit: Payer: Self-pay

## 2018-08-10 DIAGNOSIS — C8338 Diffuse large B-cell lymphoma, lymph nodes of multiple sites: Secondary | ICD-10-CM | POA: Diagnosis not present

## 2018-08-10 DIAGNOSIS — Z510 Encounter for antineoplastic radiation therapy: Secondary | ICD-10-CM | POA: Diagnosis not present

## 2018-08-13 ENCOUNTER — Ambulatory Visit
Admission: RE | Admit: 2018-08-13 | Discharge: 2018-08-13 | Disposition: A | Discharge status: Home / Self Care | Payer: 59 | Source: Ambulatory Visit | Attending: Radiation Oncology | Admitting: Radiation Oncology

## 2018-08-13 ENCOUNTER — Other Ambulatory Visit: Payer: Self-pay

## 2018-08-13 DIAGNOSIS — C8338 Diffuse large B-cell lymphoma, lymph nodes of multiple sites: Secondary | ICD-10-CM | POA: Diagnosis not present

## 2018-08-13 DIAGNOSIS — Z510 Encounter for antineoplastic radiation therapy: Secondary | ICD-10-CM | POA: Diagnosis not present

## 2018-08-14 ENCOUNTER — Other Ambulatory Visit: Payer: Self-pay

## 2018-08-14 ENCOUNTER — Ambulatory Visit
Admission: RE | Admit: 2018-08-14 | Discharge: 2018-08-14 | Disposition: A | Discharge status: Home / Self Care | Payer: 59 | Source: Ambulatory Visit | Attending: Radiation Oncology | Admitting: Radiation Oncology

## 2018-08-14 DIAGNOSIS — C8338 Diffuse large B-cell lymphoma, lymph nodes of multiple sites: Secondary | ICD-10-CM | POA: Diagnosis not present

## 2018-08-14 DIAGNOSIS — Z510 Encounter for antineoplastic radiation therapy: Secondary | ICD-10-CM | POA: Diagnosis not present

## 2018-08-14 NOTE — Progress Notes (Signed)
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 male is here because of left lower extremity edema and lymphadenopathy.  The patient was seen in the emergency room this past Friday for the same issue.  A CT of the abdomen and pelvis was performed showing bulky left inguinal, left hemipelvic, and retroperitoneal adenopathy.  He was referred to [place] from the emergency room for further evaluation.  Doppler ultrasound of the left lower extremity was performed and was negative for DVT. Patient reports that he has been having left lower extremity edema in his left groin and left leg for approximately 1 month.  He states that the swelling in the left groin started to get better but then worsened.  The left lower extremity edema has slowly worsened over time.  Patient denies having fevers and chills.  He reports that he does have night sweats at times.  He reported [place]  He thinks his headaches are related to not having his blood pressure medication.  His headaches have now resolved.  He denies visual changes.  The patient denies chest pain, shortness of breath and cough.  No nausea, vomiting, constipation, diarrhea.  Denies abdominal pain.  The patient denies recent weight loss and has actually gained weight recently.  Patient denies epistaxis, bleeding gums, hemoptysis, hematuria, but occasionally, and melena.  He reports increased urinary frequency over the past month but no dysuria.  The patient is here for evaluation and discussion of his recent CT and lab findings.  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 The patient's last visit with [place] was on 07/04/18. [name].   In the interim, the pt has been pursuing IMRT with my colleague Dr. Isidore Moos. He is planned to receive 39.6 Gy in 22 fractions to the pelvic nodes, and began on 07/25/18.  The pt reports that he has been tolerating RT pretty well overall. He notes that he had a single instance of vomiting in the interim, which he associates with something he ate. The pt endorses good energy levels and notes that he is eating well overall. The pt denies any leg swelling.   The pt notes that he continues on Flomax and denies any problems urinating.  Lab results today (08/15/18) of CBC w/diff and CMP is as follows: all values are WNL except for RBC at 3.67, HGB at 11.8, HCT at 35.3. 08/15/18 LDH at 213  On review of systems, pt reports good energy levels, eating well, weight gain, and denies leg swelling, abdominal pains, nausea, diarrhea, problems passing urine, pain along the spine, and any other symptoms.   MEDICAL HISTORY:  Past Medical History:  Diagnosis Date  . Allergy   . History of kidney stones   . Hyperlipidemia   . Hypertension   . Lymphadenopathy   . Pain, lower leg    Bilateral  . Peripheral arterial disease ([place])   . Pre-diabetes   . Red-green color blindness   .  Snores   . Wears glasses     SURGICAL HISTORY: Past Surgical History:  Procedure Laterality Date  . CATARACT EXTRACTION W/ INTRAOCULAR LENS  IMPLANT, BILATERAL    . COLONOSCOPY    . dislodged salava stone    . FRACTURE SURGERY    . HAND ARTHROPLASTY  1995   crushed left hand  . INGUINAL LYMPH NODE BIOPSY Left 01/02/2018   Procedure: LEFT INGUINAL LYMPH NODE BIOPSY;  Surgeon: Rolm Bookbinder, MD;  Location: [facility];  Service: General;  Laterality: Left;  . IR IMAGING GUIDED PORT INSERTION  01/15/2018  . MICROLARYNGOSCOPY Left  01/17/2014   Procedure: MICROLARYNGOSCOPY WITH EXCISION OF THE BIOPSY OF LEFT VOCAL CORD LESION;  Surgeon: Izora Gala, MD;  Location: [facility] [place] [facility];  Service: ENT;  Laterality: Left;  . ORIF FOOT FRACTURE  2005   left    SOCIAL HISTORY: Social History   Socioeconomic History  . Marital status: Divorced    Spouse name: Not on file  . Number of children: 3  . Years of education: Not on file  . Highest education level: Not on file  Occupational History  . Not on file  Social Needs  . Financial resource strain: Not on file  . Food insecurity:    Worry: Not on file    Inability: Not on file  . Transportation needs:    Medical: No    Non-medical: No  Tobacco Use  . Smoking status: Current Every Day Smoker    Packs/day: 0.50    Years: 36.00    Pack years: 18.00    Types: Cigarettes  . Smokeless tobacco: Never Used  . Tobacco comment: he denies smoking in about 2 weeks 07/13/18  Substance and Sexual Activity  . Alcohol use: Not Currently    Alcohol/week: 15.0 standard drinks    Types: 10 Cans of beer, 5 Shots of liquor per week    Comment: weekends, he denies current alcohol use 07/13/18  . Drug use: Not Currently    Types: Cocaine    Comment: reports cocaine usage ~2X/ month; last use 12/26/17  . Sexual activity: Not on file  Lifestyle  . Physical activity:    Days per week: Not on file    Minutes per session: Not on file  . Stress: Not on file  Relationships  . Social connections:    Talks on phone: Not on file    Gets together: Not on file    Attends religious service: Not on file    Active member of club or organization: Not on file    Attends meetings of clubs or organizations: Not on file    Relationship status: Not on file  . Intimate partner violence:    Fear of current or ex partner: No    Emotionally abused: No    Physically abused: No    Forced sexual activity: No  Other Topics Concern  . Not on file  Social History Narrative  . Not on file     FAMILY HISTORY: Family History  Problem Relation Age of Onset  . Breast cancer Mother   . Diabetes Father   . Hypertension Father   . Stroke Father   . Mental illness Sister   . Hypertension Daughter   . Mental illness Daughter   . Hypertension Brother   . Colon cancer Brother   . Breast cancer Sister     ALLERGIES:  is allergic to bee venom.  MEDICATIONS:  Current Outpatient Medications  Medication  Sig Dispense Refill  . amLODipine (NORVASC) 10 MG tablet Take 1 tablet (10 mg total) by mouth daily. 30 tablet 2  . aspirin 81 MG tablet Take 81 mg by mouth daily.    . B-COMPLEX-C PO Take by mouth.    . dexamethasone (DECADRON) 4 MG tablet Take 2 tablets (8 mg total) by mouth 2 (two) times daily with a meal. Take two times a day starting the day after chemotherapy for 3 days. (Patient not taking: Reported on 07/13/2018) 10 tablet 0  . hydrocortisone (ANUSOL-HC) 2.5 % rectal cream Place 1 application rectally 2 (two) times daily as needed for hemorrhoids. (Patient not taking: Reported on 05/28/2018) 30 g 0  . lisinopril (PRINIVIL,ZESTRIL) 20 MG tablet Take 1 tablet (20 mg total) by mouth daily. 30 tablet 1  . prochlorperazine (COMPAZINE) 10 MG tablet Take 1 tablet (10 mg total) by mouth every 6 (six) hours as needed (Nausea or vomiting). (Patient not taking: Reported on 05/28/2018) 30 tablet 1  . sulfamethoxazole-trimethoprim (BACTRIM DS,SEPTRA DS) 800-160 MG tablet Take 1 tablet by mouth daily. 21 tablet 0  . tamsulosin (FLOMAX) 0.4 MG CAPS capsule Take 1 capsule (0.4 mg total) by mouth daily after supper. 30 capsule 2   No current facility-administered medications for this visit.     REVIEW OF SYSTEMS:    A 10+ POINT REVIEW OF SYSTEMS WAS OBTAINED including neurology, dermatology, psychiatry, cardiac, respiratory, lymph, extremities, GI, GU, Musculoskeletal, constitutional, breasts, reproductive, HEENT.  All pertinent positives are noted in the HPI.  All others are negative.    PHYSICAL EXAMINATION: ECOG PERFORMANCE STATUS: 1 - Symptomatic but completely ambulatory  Vitals:   08/15/18 1249  BP: 124/80  Pulse: 75  Resp: 18  Temp: 99 F (37.2 C)  SpO2: 99%   Filed Weights   08/15/18 1249  Weight: 264 lb 6.4 oz (119.9 kg)    GENERAL:alert, in no acute distress and comfortable SKIN: no acute rashes, no significant lesions EYES: conjunctiva are pink and non-injected, sclera anicteric OROPHARYNX: MMM, no exudates, no oropharyngeal erythema or ulceration NECK: supple, no JVD LYMPH:  no palpable lymphadenopathy in the cervical, axillary or inguinal regions LUNGS: clear to auscultation b/l with normal respiratory effort HEART: regular rate & rhythm ABDOMEN:  normoactive bowel sounds , non tender, not distended. No palpable hepatosplenomegaly.  Extremity: no pedal edema PSYCH: alert & oriented x 3 with fluent speech NEURO: no focal motor/sensory deficits   LABORATORY DATA:  I have reviewed the data as listed CBC Latest Ref Rng & Units 08/15/2018 07/04/2018 06/11/2018  WBC 4.0 - 10.5 K/uL 5.2 6.1 17.3(H)  Hemoglobin 13.0 - 17.0 g/dL 11.8(L) 10.5(L) 9.4(L)  Hematocrit 39.0 - 52.0 % 35.3(L) 32.1(L) 29.1(L)  Platelets 150 - 400 K/uL 217 213 99(L)    CMP Latest Ref Rng & Units 08/15/2018 07/04/2018 06/11/2018  Glucose 70 - 99 mg/dL 87 96 134(H)  BUN 8 - 23 mg/dL 19 16 11   Creatinine 0.61 - 1.24 mg/dL 1.12 1.01 1.19  Sodium 135 - 145 mmol/L 141 144 141  Potassium 3.5 - 5.1 mmol/L 3.9 3.9 4.0  Chloride 98 - 111 mmol/L 108 111 102  CO2 22 - 32 mmol/L 25 25 31   Calcium 8.9 - 10.3 mg/dL 8.9 9.6 9.8  Total Protein 6.5 - 8.1 g/dL 6.6 6.6 6.5  Total Bilirubin 0.3 - 1.2 mg/dL 0.5 0.2(L) 0.4  Alkaline Phos 38 - 126 U/L 62 71 79  AST 15 - 41 U/L 19 17 21   ALT 0 -  44 U/L 14 13 14    01/02/18 Left Inguinal LN Bx:   12/26/17 Tissue Flow Cytometry:   12/26/17 Inguinal Core biopsy:    RADIOGRAPHIC STUDIES: I have personally reviewed the radiological images as listed  and agreed with the findings in the report. No results found.  ASSESSMENT & PLAN:   This is a pleasant 63 y.o. 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  CT of the abdomen and pelvis performed on 12/22/2017 showed bulky left inguinal, left hemipelvic, and retroperitoneal adenopathy.   01/02/18 Left inguinal LN Biopsy revealed T-Cell/histocyte rich Large B-Cell Lymphoma  12/27/17 ECHO revealed LV EF of 55-60%   01/05/18 PET/CT revealedMassively enlarged pelvic lymph nodes intense metabolic activity consistent lymphoma. 2. Additional hypermetabolic lymph nodes in the porta hepatis and retroperitoneum LEFT aorta. 3. Solitary hypermetabolic mediastinal lymph node in the upper LEFT Mediastinum. 4. 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 5. Normal spleen.  HIV non reactive on 12/22/2017.Hep C and hep B serology negative.  03/14/18 PET/CT revealedPET-CT findings suggest an excellent response to chemotherapy. The abdominal lymphadenopathy has near completely resolved and demonstrates a near complete metabolic response. The pelvic and inguinal adenopathy has significantly decreased in size and the metabolic activity has significantly decreased. 2. Diffuse marrow activity likely due to chemotherapy and or marrow stimulating drugs. I do not see any discrete persistent lesions.   04/17/18 CT Head revealedSubtle mesial caudothalamic hypodensities may be artifact though, the could reflect encephalitis or Wernicke's encephalopathy. Consider MRI of the head with and without contrast. 2. Mild chronic small vessel ischemic changes  S/p 6 cycles of EPOCH-R completed on 06/01/18   06/28/18 PET/CT revealed Continued good response to treatment. No residual measurable or hypermetabolic abdominal lymphadenopathy and no recurrent osseous disease. 2. Interval decrease in size of the left operator region lymph node and also the left inguinal lymph node. However, the both have small foci of slightly increased hypermetabolism which bears surveillance. 3. No new or progressive lymphadenopathy in the neck, chest, abdomen or pelvis.  2) left lower extremity swelling- now resolved Doppler ultrasound for DVT was negative in the left lower extremity.  Likely from venous compression +/- lymphatic obstruction from bulky left inguinal, left hemipelvic, and retroperitoneal adenopathy.   3) S/p Port a cath placement   4)h/oE.coli UTI - Pansensitive -Resolved.Also appearedto have BPH like symptomatology. -Was hospitalized 04/16/18 to 04/20/18 for E.coli sepsis from UTI, treated with antibiotics.  5)s/pE.COLi sepsis - likely from urinary source. Recent h/o E.coli UTI  6) s/p Symptomatic Anemia Hgb 6.7 - due to chemotherapy/sepsis- s/p PRBC transfusion. No overt evidence of bleeding. hgb now stable at 10.6  7) HTN-was elevated in setting of improved po intake and steroids -improving control -conitnueon lisinopril 20 mg p.o. daily today -Continue Flomax for his BPH. -increasedamlodipine to 10 mgTo optimize BP control -will monitor and optimize rx accordingly.  8) Likely BPH - on flomax  PLAN: -Discussed pt labwork today, 08/15/18; HGB has continued to improve now to 11.8, WBC normal at 5.2k, PLT normal at 217k. LDH at 213. Chemistries are normal. -Continue RT with Dr. Isidore Moos in [place]. Pt is pursuing 39.6 Gy in 22 fractions and began on 07/25/18 -Continue empiric Vitamin B complex -Continue with port flushes, and will remove port in 3-6 months -Follow up with urology  for decision on continuing Flomax as indicated by either enlarged prostate or as a result of  previous UTIs -Recommended that the pt continue to eat well, drink at least 48-64 oz of water each day, and walk 20-30 minutes each day.  -Will see the pt back in 4 weeks   Orders Placed This Encounter  Procedures  . CBC with Differential/Platelet    Standing Status:   Future    Standing Expiration Date:   09/19/2019  . CMP ([facility] only)    Standing Status:   Future    Standing Expiration Date:   08/15/2019  . Lactate dehydrogenase    Standing Status:   Future    Standing Expiration Date:   08/15/2019  . Magnesium    Standing Status:   Future    Standing Expiration Date:   08/15/2019    RTC with Dr Irene Limbo with port flush/labs and MD visit in 4 weeks   All questions were answered. The patient knows to call the clinic with any problems, questions or concerns.  The total time spent in the appt was 25 minutes and more than 50% was on counseling and direct patient cares.   Sullivan Lone MD [place] AAHIVMS [facility] Hematology/Oncology Physician [facility]  (Office):       [phone] (Work cell):  [phone] (Fax):           [phone]  I, Baldwin [place], am acting as a scribe for Dr. Sullivan Lone.   .I have reviewed the above documentation for accuracy and completeness, and I agree with the above. Brunetta Genera MD

## 2018-08-15 ENCOUNTER — Other Ambulatory Visit: Payer: Self-pay

## 2018-08-15 ENCOUNTER — Inpatient Hospital Stay: Payer: 59

## 2018-08-15 ENCOUNTER — Inpatient Hospital Stay: Payer: 59 | Attending: Oncology | Admitting: Hematology

## 2018-08-15 ENCOUNTER — Ambulatory Visit
Admission: RE | Admit: 2018-08-15 | Discharge: 2018-08-15 | Disposition: A | Discharge status: Home / Self Care | Payer: 59 | Source: Ambulatory Visit | Attending: Radiation Oncology | Admitting: Radiation Oncology

## 2018-08-15 VITALS — BP 124/80 | HR 75 | Temp 99.0°F | Resp 18 | Ht 72.0 in | Wt 264.4 lb

## 2018-08-15 DIAGNOSIS — I10 Essential (primary) hypertension: Secondary | ICD-10-CM

## 2018-08-15 DIAGNOSIS — Z9221 Personal history of antineoplastic chemotherapy: Secondary | ICD-10-CM

## 2018-08-15 DIAGNOSIS — D649 Anemia, unspecified: Secondary | ICD-10-CM

## 2018-08-15 DIAGNOSIS — C8338 Diffuse large B-cell lymphoma, lymph nodes of multiple sites: Secondary | ICD-10-CM

## 2018-08-15 DIAGNOSIS — R5383 Other fatigue: Secondary | ICD-10-CM

## 2018-08-15 DIAGNOSIS — Z510 Encounter for antineoplastic radiation therapy: Secondary | ICD-10-CM | POA: Diagnosis not present

## 2018-08-15 DIAGNOSIS — R5381 Other malaise: Secondary | ICD-10-CM

## 2018-08-15 DIAGNOSIS — B20 Human immunodeficiency virus [HIV] disease: Secondary | ICD-10-CM | POA: Diagnosis not present

## 2018-08-15 LAB — CBC WITH DIFFERENTIAL/PLATELET
Abs Immature Granulocytes: 0.04 10*3/uL (ref 0.00–0.07)
Basophils Absolute: 0 10*3/uL (ref 0.0–0.1)
Basophils Relative: 1 %
Eosinophils Absolute: 0.1 10*3/uL (ref 0.0–0.5)
Eosinophils Relative: 1 %
HCT: 35.3 % — ABNORMAL LOW (ref 39.0–52.0)
Hemoglobin: 11.8 g/dL — ABNORMAL LOW (ref 13.0–17.0)
Immature Granulocytes: 1 %
Lymphocytes Relative: 21 %
Lymphs Abs: 1.1 10*3/uL (ref 0.7–4.0)
MCH: 32.2 pg (ref 26.0–34.0)
MCHC: 33.4 g/dL (ref 30.0–36.0)
MCV: 96.2 fL (ref 80.0–100.0)
Monocytes Absolute: 0.7 10*3/uL (ref 0.1–1.0)
Monocytes Relative: 14 %
Neutro Abs: 3.2 10*3/uL (ref 1.7–7.7)
Neutrophils Relative %: 62 %
Platelets: 217 10*3/uL (ref 150–400)
RBC: 3.67 MIL/uL — ABNORMAL LOW (ref 4.22–5.81)
RDW: 14.4 % (ref 11.5–15.5)
WBC: 5.2 10*3/uL (ref 4.0–10.5)
nRBC: 0 % (ref 0.0–0.2)

## 2018-08-15 LAB — CMP (CANCER CENTER ONLY)
ALT: 14 U/L (ref 0–44)
AST: 19 U/L (ref 15–41)
Albumin: 4 g/dL (ref 3.5–5.0)
Alkaline Phosphatase: 62 U/L (ref 38–126)
Anion gap: 8 (ref 5–15)
BUN: 19 mg/dL (ref 8–23)
CO2: 25 mmol/L (ref 22–32)
Calcium: 8.9 mg/dL (ref 8.9–10.3)
Chloride: 108 mmol/L (ref 98–111)
Creatinine: 1.12 mg/dL (ref 0.61–1.24)
GFR, Est AFR Am: >60 mL/min (ref >60)
GFR, Estimated: >60 mL/min (ref >60)
Glucose, Bld: 87 mg/dL (ref 70–99)
Potassium: 3.9 mmol/L (ref 3.5–5.1)
Sodium: 141 mmol/L (ref 135–145)
Total Bilirubin: 0.5 mg/dL (ref 0.3–1.2)
Total Protein: 6.6 g/dL (ref 6.5–8.1)

## 2018-08-15 LAB — LACTATE DEHYDROGENASE: LDH: 213 U/L — ABNORMAL HIGH (ref 98–192)

## 2018-08-16 ENCOUNTER — Ambulatory Visit
Admission: RE | Admit: 2018-08-16 | Discharge: 2018-08-16 | Disposition: A | Discharge status: Home / Self Care | Payer: 59 | Source: Ambulatory Visit | Attending: Radiation Oncology | Admitting: Radiation Oncology

## 2018-08-16 ENCOUNTER — Other Ambulatory Visit: Payer: Self-pay

## 2018-08-16 ENCOUNTER — Telehealth: Payer: Self-pay | Admitting: Hematology

## 2018-08-16 DIAGNOSIS — C8338 Diffuse large B-cell lymphoma, lymph nodes of multiple sites: Secondary | ICD-10-CM | POA: Diagnosis not present

## 2018-08-16 DIAGNOSIS — Z510 Encounter for antineoplastic radiation therapy: Secondary | ICD-10-CM | POA: Diagnosis not present

## 2018-08-16 NOTE — Telephone Encounter (Signed)
Scheduled appt per 4/8 los.  Patient did not answer, left a VM of scheduled appt.

## 2018-08-17 ENCOUNTER — Other Ambulatory Visit: Payer: Self-pay

## 2018-08-17 ENCOUNTER — Ambulatory Visit
Admission: RE | Admit: 2018-08-17 | Discharge: 2018-08-17 | Disposition: A | Discharge status: Home / Self Care | Payer: 59 | Source: Ambulatory Visit | Attending: Radiation Oncology | Admitting: Radiation Oncology

## 2018-08-17 DIAGNOSIS — C8338 Diffuse large B-cell lymphoma, lymph nodes of multiple sites: Secondary | ICD-10-CM | POA: Diagnosis not present

## 2018-08-17 DIAGNOSIS — Z510 Encounter for antineoplastic radiation therapy: Secondary | ICD-10-CM | POA: Diagnosis not present

## 2018-08-20 ENCOUNTER — Other Ambulatory Visit: Payer: Self-pay

## 2018-08-20 ENCOUNTER — Ambulatory Visit
Admission: RE | Admit: 2018-08-20 | Discharge: 2018-08-20 | Disposition: A | Discharge status: Home / Self Care | Payer: 59 | Source: Ambulatory Visit | Attending: Radiation Oncology | Admitting: Radiation Oncology

## 2018-08-20 DIAGNOSIS — Z510 Encounter for antineoplastic radiation therapy: Secondary | ICD-10-CM | POA: Diagnosis not present

## 2018-08-20 DIAGNOSIS — C8338 Diffuse large B-cell lymphoma, lymph nodes of multiple sites: Secondary | ICD-10-CM | POA: Diagnosis not present

## 2018-08-21 ENCOUNTER — Ambulatory Visit
Admission: RE | Admit: 2018-08-21 | Discharge: 2018-08-21 | Disposition: A | Discharge status: Home / Self Care | Payer: 59 | Source: Ambulatory Visit | Attending: Radiation Oncology | Admitting: Radiation Oncology

## 2018-08-21 ENCOUNTER — Other Ambulatory Visit: Payer: Self-pay

## 2018-08-21 DIAGNOSIS — C8338 Diffuse large B-cell lymphoma, lymph nodes of multiple sites: Secondary | ICD-10-CM | POA: Diagnosis not present

## 2018-08-21 DIAGNOSIS — Z510 Encounter for antineoplastic radiation therapy: Secondary | ICD-10-CM | POA: Diagnosis not present

## 2018-08-22 ENCOUNTER — Other Ambulatory Visit: Payer: Self-pay

## 2018-08-22 ENCOUNTER — Ambulatory Visit
Admission: RE | Admit: 2018-08-22 | Discharge: 2018-08-22 | Disposition: A | Discharge status: Home / Self Care | Payer: 59 | Source: Ambulatory Visit | Attending: Radiation Oncology | Admitting: Radiation Oncology

## 2018-08-22 DIAGNOSIS — Z510 Encounter for antineoplastic radiation therapy: Secondary | ICD-10-CM | POA: Diagnosis not present

## 2018-08-22 DIAGNOSIS — C8338 Diffuse large B-cell lymphoma, lymph nodes of multiple sites: Secondary | ICD-10-CM | POA: Diagnosis not present

## 2018-08-23 ENCOUNTER — Other Ambulatory Visit: Payer: Self-pay

## 2018-08-23 ENCOUNTER — Encounter: Payer: Self-pay | Admitting: Radiation Oncology

## 2018-08-23 ENCOUNTER — Ambulatory Visit
Admission: RE | Admit: 2018-08-23 | Discharge: 2018-08-23 | Disposition: A | Discharge status: Home / Self Care | Payer: 59 | Source: Ambulatory Visit | Attending: Radiation Oncology | Admitting: Radiation Oncology

## 2018-08-23 DIAGNOSIS — C8338 Diffuse large B-cell lymphoma, lymph nodes of multiple sites: Secondary | ICD-10-CM | POA: Diagnosis not present

## 2018-08-23 DIAGNOSIS — Z510 Encounter for antineoplastic radiation therapy: Secondary | ICD-10-CM | POA: Diagnosis not present

## 2018-08-28 ENCOUNTER — Ambulatory Visit: Payer: 59 | Admitting: Physical Therapy

## 2018-08-30 ENCOUNTER — Encounter: Payer: 59 | Admitting: Physical Therapy

## 2018-09-04 ENCOUNTER — Encounter: Payer: 59 | Admitting: Physical Therapy

## 2018-09-06 ENCOUNTER — Encounter: Payer: 59 | Admitting: Physical Therapy

## 2018-09-11 ENCOUNTER — Encounter: Payer: 59 | Admitting: Physical Therapy

## 2018-09-11 NOTE — Progress Notes (Signed)
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 male is here because of left lower extremity edema and lymphadenopathy.  The patient was seen in the emergency room this past Friday for the same issue.  A CT of the abdomen and pelvis was performed showing bulky left inguinal, left hemipelvic, and retroperitoneal adenopathy.  He was referred to [place] from the emergency room for further evaluation.  Doppler ultrasound of the left lower extremity was performed and was negative for DVT. Patient reports that he has been having left lower extremity edema in his left groin and left leg for approximately 1 month.  He states that the swelling in the left groin started to get better but then worsened.  The left lower extremity edema has slowly worsened over time.  Patient denies having fevers and chills.  He reports that he does have night sweats at times.  He reported having headaches approximate 1 [place]  He thinks his headaches are related to not having his blood pressure medication.  His headaches have now resolved.  He denies visual changes.  The patient denies chest pain, shortness of breath and cough.  No nausea, vomiting, constipation, diarrhea.  Denies abdominal pain.  The patient denies recent weight loss and has actually gained weight recently.  Patient denies epistaxis, bleeding gums, hemoptysis, hematuria, but occasionally, and melena.  He reports increased urinary frequency over the past month but no dysuria.  The patient is here for evaluation and discussion of his recent CT and lab findings.  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 The patient's last visit with [place] was on 08/15/18. [name].  The pt reports that he has been eating "too well," and endorses stable weight. He notes that he tolerated RT well and concluded this 2 weeks ago.   The pt notes that his right leg has begun hurting recently, and presents intermittently and is like a throbbing pain. He notes that this pain is in the front of his lower leg along his shin, and gets worse as he walks. The pt denies tenderness to the touch and denies pain in the lower back. He denies any trauma. He notes that he was able to mow his lawn over the weekend, and that the pain increased after this. He denies calf pain. He does feel that his strength is coming back and he is increasing his activity levels.  He denies mouth sores or concerns for infections.  Lab results today (09/12/18) of CBC w/diff and CMP is as follows: all values are WNL except for RBC at 3.69, HGB at 11.8, HCT at 35.5. 5/620 Magnesium is normal at 1.8  On review of systems, pt reports eating well, right leg pain, and denies nausea, diarrhea, mouth sores, concerns for infections, abdominal pain, leg swelling, calf swelling, and any other symptoms.  MEDICAL HISTORY:   Past Medical History:  Diagnosis Date   Allergy    History of kidney stones  Hyperlipidemia    Hypertension    Lymphadenopathy    Pain, lower leg    Bilateral   Peripheral arterial disease (HCC)    Pre-diabetes    Red-green color blindness    Snores    Wears glasses     SURGICAL HISTORY: Past Surgical History:  Procedure Laterality Date   CATARACT EXTRACTION W/ INTRAOCULAR LENS  IMPLANT, BILATERAL     COLONOSCOPY     dislodged salava stone     FRACTURE SURGERY     HAND ARTHROPLASTY  1995   crushed left hand   INGUINAL LYMPH NODE BIOPSY Left 01/02/2018   Procedure: LEFT  INGUINAL LYMPH NODE BIOPSY;  Surgeon: Rolm Bookbinder, MD;  Location: [facility];  Service: General;  Laterality: Left;   IR IMAGING GUIDED PORT INSERTION  01/15/2018   MICROLARYNGOSCOPY Left 01/17/2014   Procedure: MICROLARYNGOSCOPY WITH EXCISION OF THE BIOPSY OF LEFT VOCAL CORD LESION;  Surgeon: Izora Gala, MD;  Location: [facility];  Service: ENT;  Laterality: Left;   ORIF FOOT FRACTURE  2005   left    SOCIAL HISTORY: Social History   Socioeconomic History   Marital status: Divorced    Spouse name: Not on file   Number of children: 3   Years of education: Not on file   Highest education level: Not on file  Occupational History   Not on file  Social Needs   Financial resource strain: Not on file   Food insecurity:    Worry: Not on file    Inability: Not on file   Transportation needs:    Medical: No    Non-medical: No  Tobacco Use   Smoking status: Current Every Day Smoker    Packs/day: 0.50    Years: 36.00    Pack years: 18.00    Types: Cigarettes   Smokeless tobacco: Never Used   Tobacco comment: he denies smoking in about 2 weeks 07/13/18  Substance and Sexual Activity   Alcohol use: Not Currently    Alcohol/week: 15.0 standard drinks    Types: 10 Cans of beer, 5 Shots of liquor per week    Comment: weekends, he denies current alcohol use 07/13/18   Drug use: Not Currently    Types: Cocaine    Comment: reports cocaine usage ~2X/ month; last use 12/26/17   Sexual activity: Not on file  Lifestyle   Physical activity:    Days per week: Not on file    Minutes per session: Not on file   Stress: Not on file  Relationships   Social connections:    Talks on phone: Not on file    Gets together: Not on file    Attends religious service: Not on file    Active member of club or organization: Not on file    Attends meetings of clubs or organizations: Not on file    Relationship status: Not on file   Intimate partner violence:    Fear of  current or ex partner: No    Emotionally abused: No    Physically abused: No    Forced sexual activity: No  Other Topics Concern   Not on file  Social History Narrative   Not on file    FAMILY HISTORY: Family History  Problem Relation Age of Onset   Breast cancer Mother    Diabetes Father    Hypertension Father    Stroke Father    Mental illness Sister    Hypertension Daughter    Mental  illness Daughter    Hypertension Brother    Colon cancer Brother    Breast cancer Sister     ALLERGIES:  is allergic to bee venom.  MEDICATIONS:  Current Outpatient Medications  Medication Sig Dispense Refill   amLODipine (NORVASC) 10 MG tablet Take 1 tablet (10 mg total) by mouth daily. 30 tablet 2   aspirin 81 MG tablet Take 81 mg by mouth daily.     B-COMPLEX-C PO Take by mouth.     dexamethasone (DECADRON) 4 MG tablet Take 2 tablets (8 mg total) by mouth 2 (two) times daily with a meal. Take two times a day starting the day after chemotherapy for 3 days. (Patient not taking: Reported on 07/13/2018) 10 tablet 0   hydrocortisone (ANUSOL-HC) 2.5 % rectal cream Place 1 application rectally 2 (two) times daily as needed for hemorrhoids. (Patient not taking: Reported on 05/28/2018) 30 g 0   lisinopril (PRINIVIL,ZESTRIL) 20 MG tablet Take 1 tablet (20 mg total) by mouth daily. 30 tablet 1   prochlorperazine (COMPAZINE) 10 MG tablet Take 1 tablet (10 mg total) by mouth every 6 (six) hours as needed (Nausea or vomiting). (Patient not taking: Reported on 05/28/2018) 30 tablet 1   sulfamethoxazole-trimethoprim (BACTRIM DS,SEPTRA DS) 800-160 MG tablet Take 1 tablet by mouth daily. 21 tablet 0   tamsulosin (FLOMAX) 0.4 MG CAPS capsule Take 1 capsule (0.4 mg total) by mouth daily after supper. 30 capsule 2   No current facility-administered medications for this visit.     REVIEW OF SYSTEMS:    A 10+ POINT REVIEW OF SYSTEMS WAS OBTAINED including neurology, dermatology, psychiatry,  cardiac, respiratory, lymph, extremities, GI, GU, Musculoskeletal, constitutional, breasts, reproductive, HEENT.  All pertinent positives are noted in the HPI.  All others are negative.   PHYSICAL EXAMINATION: ECOG PERFORMANCE STATUS: 1 - Symptomatic but completely ambulatory  Vitals:   09/12/18 1424  BP: (!) 142/80  Pulse: 72  Resp: 17  Temp: 99.5 F (37.5 C)  SpO2: 99%   Filed Weights   09/12/18 1424  Weight: 271 lb 9.6 oz (123.2 kg)    GENERAL:alert, in no acute distress and comfortable SKIN: no acute rashes, no significant lesions EYES: conjunctiva are pink and non-injected, sclera anicteric OROPHARYNX: MMM, no exudates, no oropharyngeal erythema or ulceration NECK: supple, no JVD LYMPH:  no palpable lymphadenopathy in the cervical, axillary or inguinal regions LUNGS: clear to auscultation b/l with normal respiratory effort HEART: regular rate & rhythm ABDOMEN:  normoactive bowel sounds , non tender, not distended. No palpable hepatosplenomegaly.  Extremity: no pedal edema PSYCH: alert & oriented x 3 with fluent speech NEURO: no focal motor/sensory deficits   LABORATORY DATA:   I have reviewed the data as listed CBC Latest Ref Rng & Units 09/12/2018 08/15/2018 07/04/2018  WBC 4.0 - 10.5 K/uL 5.6 5.2 6.1  Hemoglobin 13.0 - 17.0 g/dL 11.8(L) 11.8(L) 10.5(L)  Hematocrit 39.0 - 52.0 % 35.5(L) 35.3(L) 32.1(L)  Platelets 150 - 400 K/uL 246 217 213   CBC    Component Value Date/Time   WBC 5.6 09/12/2018 1350   RBC 3.69 (L) 09/12/2018 1350   HGB 11.8 (L) 09/12/2018 1350   HCT 35.5 (L) 09/12/2018 1350   PLT 246 09/12/2018 1350   MCV 96.2 09/12/2018 1350   MCV 87.3 12/22/2017 1518   MCH 32.0 09/12/2018 1350   MCHC 33.2 09/12/2018 1350   RDW 13.8 09/12/2018 1350   LYMPHSABS 1.2 09/12/2018 1350   MONOABS 0.6 09/12/2018 1350   EOSABS  0.1 09/12/2018 1350   BASOSABS 0.0 09/12/2018 1350    CMP Latest Ref Rng & Units 09/12/2018 08/15/2018 07/04/2018  Glucose 70 - 99 mg/dL 79 87  96  BUN 8 - 23 mg/dL 20 19 16   Creatinine 0.61 - 1.24 mg/dL 1.00 1.12 1.01  Sodium 135 - 145 mmol/L 145 141 144  Potassium 3.5 - 5.1 mmol/L 4.1 3.9 3.9  Chloride 98 - 111 mmol/L 107 108 111  CO2 22 - 32 mmol/L 27 25 25   Calcium 8.9 - 10.3 mg/dL 8.9 8.9 9.6  Total Protein 6.5 - 8.1 g/dL 6.7 6.6 6.6  Total Bilirubin 0.3 - 1.2 mg/dL 0.6 0.5 0.2(L)  Alkaline Phos 38 - 126 U/L 58 62 71  AST 15 - 41 U/L 22 19 17   ALT 0 - 44 U/L 14 14 13    . Lab Results  Component Value Date   LDH 336 (H) 09/12/2018    01/02/18 Left Inguinal LN Bx:   12/26/17 Tissue Flow Cytometry:   12/26/17 Inguinal Core biopsy:    RADIOGRAPHIC STUDIES: I have personally reviewed the radiological images as listed and agreed with the findings in the report. No results found.  ASSESSMENT & PLAN:   This is a pleasant 63 y.o. 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  CT of the abdomen and pelvis performed on 12/22/2017 showed bulky left inguinal, left hemipelvic, and retroperitoneal adenopathy.   01/02/18 Left inguinal LN Biopsy revealed T-Cell/histocyte rich Large B-Cell Lymphoma  12/27/17 ECHO revealed LV EF of 55-60%   01/05/18 PET/CT revealedMassively enlarged pelvic lymph nodes intense metabolic activity consistent lymphoma. 2. Additional hypermetabolic lymph nodes in the porta hepatis and retroperitoneum LEFT aorta. 3. Solitary hypermetabolic mediastinal lymph node in the upper LEFT Mediastinum. 4. 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 5. Normal spleen.  HIV non reactive on 12/22/2017.Hep C and hep B serology negative.  03/14/18 PET/CT revealedPET-CT findings suggest an  excellent response to chemotherapy. The abdominal lymphadenopathy has near completely resolved and demonstrates a near complete metabolic response. The pelvic and inguinal adenopathy has significantly decreased in size and the metabolic activity has significantly decreased. 2. Diffuse marrow activity likely due to chemotherapy and or marrow stimulating drugs. I do not see any discrete persistent lesions.   04/17/18 CT Head revealedSubtle mesial caudothalamic hypodensities may be artifact though, the could reflect encephalitis or Wernicke's encephalopathy. Consider MRI of the head with and without contrast. 2. Mild chronic small vessel ischemic changes  S/p 6 cycles of EPOCH-R completed on 06/01/18  06/28/18 PET/CT revealed Continued good response to treatment. No residual measurable or hypermetabolic abdominal lymphadenopathy and no recurrent osseous disease. 2. Interval decrease in size of the left operator region lymph node and also the left inguinal lymph node. However, the both have small foci of slightly increased hypermetabolism which bears surveillance. 3. No new or progressive lymphadenopathy in the neck, chest, abdomen or pelvis.  S/p 39.6 Gy in 22 fractions between 07/25/18 and 08/23/18  2) left lower extremity swelling- now resolved Doppler ultrasound for DVT was negative in the left lower extremity.  Likely from venous compression +/- lymphatic obstruction from bulky left inguinal, left hemipelvic, and retroperitoneal adenopathy.   3) S/p Port a cath placement   4)h/oE.coli UTI - Pansensitive -Resolved.Also appearedto have BPH like symptomatology. -Was  hospitalized 04/16/18 to 04/20/18 for E.coli sepsis from UTI, treated with antibiotics.  5)s/pE.COLi sepsis - likely from urinary source. Recent h/o E.coli UTI  6) s/p Symptomatic Anemia Hgb 6.7 - due to chemotherapy/sepsis- s/p PRBC transfusion. No overt evidence of bleeding. hgb now stable at 10.6  7) HTN-was  elevated in setting of improved po intake and steroids -improving control -conitnueon lisinopril 20 mg p.o. daily today -Continue Flomax for his BPH. -increasedamlodipine to 10 mgTo optimize BP control -will monitor and optimize rx accordingly.  8) Likely BPH - on flomax  PLAN: -Discussed pt labwork today, 09/12/18; blood counts are stable, chemistries are normal, magnesium normall -09/12/18 LDH is elevated but patient has no focal symptoms and is feeling great. Will need to be tracked. -The pt shows no clinical or lab progression/return of his histocyte rich large B-cell lymphoma at this time.  -No indication for further treatment at this time -Continue empiric Vitamin B complex -Continue with port flushes, and will remove port in 3-6 months -Recommended that the pt continue to eat well, drink at least 48-64 oz of water each day, and walk 20-30 minutes each day. -Will see the pt back in 10 weeks   Orders Placed This Encounter  Procedures   CBC with Differential/Platelet    Standing Status:   Future    Standing Expiration Date:   10/17/2019   CMP ([facility] only)    Standing Status:   Future    Standing Expiration Date:   09/12/2019   Lactate dehydrogenase    Standing Status:   Future    Standing Expiration Date:   09/12/2019    RTC with Dr Irene Limbo with labs in 10 weeks   All questions were answered. The patient knows to call the clinic with any problems, questions or concerns.  The total time spent in the appt was 25 minutes and more than 50% was on counseling and direct patient cares.   Sullivan Lone MD [place] AAHIVMS [facility] Hematology/Oncology Physician [facility]  (Office):       [phone] (Work cell):  [phone] (Fax):           [phone]  I, Baldwin [place], am acting as a scribe for Dr. Sullivan Lone.   .I have reviewed the above documentation for accuracy and completeness, and I agree with the above. Brunetta Genera MD

## 2018-09-12 ENCOUNTER — Telehealth: Payer: Self-pay | Admitting: Hematology

## 2018-09-12 ENCOUNTER — Inpatient Hospital Stay: Payer: 59

## 2018-09-12 ENCOUNTER — Inpatient Hospital Stay (HOSPITAL_BASED_OUTPATIENT_CLINIC_OR_DEPARTMENT_OTHER): Payer: 59 | Admitting: Hematology

## 2018-09-12 ENCOUNTER — Inpatient Hospital Stay: Payer: 59 | Attending: Oncology

## 2018-09-12 ENCOUNTER — Other Ambulatory Visit: Payer: Self-pay

## 2018-09-12 VITALS — BP 142/80 | HR 72 | Temp 99.5°F | Resp 17 | Ht 72.0 in | Wt 271.6 lb

## 2018-09-12 DIAGNOSIS — Z95828 Presence of other vascular implants and grafts: Secondary | ICD-10-CM

## 2018-09-12 DIAGNOSIS — D649 Anemia, unspecified: Secondary | ICD-10-CM

## 2018-09-12 DIAGNOSIS — I10 Essential (primary) hypertension: Secondary | ICD-10-CM

## 2018-09-12 DIAGNOSIS — C8338 Diffuse large B-cell lymphoma, lymph nodes of multiple sites: Secondary | ICD-10-CM | POA: Diagnosis present

## 2018-09-12 DIAGNOSIS — B20 Human immunodeficiency virus [HIV] disease: Secondary | ICD-10-CM | POA: Insufficient documentation

## 2018-09-12 DIAGNOSIS — Z9221 Personal history of antineoplastic chemotherapy: Secondary | ICD-10-CM | POA: Diagnosis not present

## 2018-09-12 LAB — CMP (CANCER CENTER ONLY)
ALT: 14 U/L (ref 0–44)
AST: 22 U/L (ref 15–41)
Albumin: 3.9 g/dL (ref 3.5–5.0)
Alkaline Phosphatase: 58 U/L (ref 38–126)
Anion gap: 11 (ref 5–15)
BUN: 20 mg/dL (ref 8–23)
CO2: 27 mmol/L (ref 22–32)
Calcium: 8.9 mg/dL (ref 8.9–10.3)
Chloride: 107 mmol/L (ref 98–111)
Creatinine: 1 mg/dL (ref 0.61–1.24)
GFR, Est AFR Am: >60 mL/min (ref >60)
GFR, Estimated: >60 mL/min (ref >60)
Glucose, Bld: 79 mg/dL (ref 70–99)
Potassium: 4.1 mmol/L (ref 3.5–5.1)
Sodium: 145 mmol/L (ref 135–145)
Total Bilirubin: 0.6 mg/dL (ref 0.3–1.2)
Total Protein: 6.7 g/dL (ref 6.5–8.1)

## 2018-09-12 LAB — LACTATE DEHYDROGENASE: LDH: 336 U/L — ABNORMAL HIGH (ref 98–192)

## 2018-09-12 LAB — CBC WITH DIFFERENTIAL/PLATELET
Abs Immature Granulocytes: 0.05 10*3/uL (ref 0.00–0.07)
Basophils Absolute: 0 10*3/uL (ref 0.0–0.1)
Basophils Relative: 1 %
Eosinophils Absolute: 0.1 10*3/uL (ref 0.0–0.5)
Eosinophils Relative: 3 %
HCT: 35.5 % — ABNORMAL LOW (ref 39.0–52.0)
Hemoglobin: 11.8 g/dL — ABNORMAL LOW (ref 13.0–17.0)
Immature Granulocytes: 1 %
Lymphocytes Relative: 22 %
Lymphs Abs: 1.2 10*3/uL (ref 0.7–4.0)
MCH: 32 pg (ref 26.0–34.0)
MCHC: 33.2 g/dL (ref 30.0–36.0)
MCV: 96.2 fL (ref 80.0–100.0)
Monocytes Absolute: 0.6 10*3/uL (ref 0.1–1.0)
Monocytes Relative: 11 %
Neutro Abs: 3.6 10*3/uL (ref 1.7–7.7)
Neutrophils Relative %: 62 %
Platelets: 246 10*3/uL (ref 150–400)
RBC: 3.69 MIL/uL — ABNORMAL LOW (ref 4.22–5.81)
RDW: 13.8 % (ref 11.5–15.5)
WBC: 5.6 10*3/uL (ref 4.0–10.5)
nRBC: 0 % (ref 0.0–0.2)

## 2018-09-12 LAB — MAGNESIUM: Magnesium: 1.8 mg/dL (ref 1.7–2.4)

## 2018-09-12 MED ORDER — SODIUM CHLORIDE 0.9% FLUSH
10.0000 mL | INTRAVENOUS | Status: DC | PRN
  Administered 2018-09-12: 10 mL via INTRAVENOUS
  Filled 2018-09-12: qty 10

## 2018-09-12 MED ORDER — HEPARIN SOD (PORK) LOCK FLUSH 100 UNIT/ML IV SOLN
500.0000 [IU] | Freq: Once | INTRAVENOUS | Status: AC
  Administered 2018-09-12: 500 [IU] via INTRAVENOUS
  Filled 2018-09-12: qty 5

## 2018-09-12 NOTE — Telephone Encounter (Signed)
Scheduled appt per 5/6 los.  A calendar will be mailed out.

## 2018-09-18 ENCOUNTER — Encounter: Payer: 59 | Admitting: Physical Therapy

## 2018-09-18 ENCOUNTER — Ambulatory Visit: Payer: 59 | Admitting: Rehabilitation

## 2018-09-19 ENCOUNTER — Encounter: Payer: Self-pay | Admitting: Hematology

## 2018-09-20 ENCOUNTER — Encounter: Payer: 59 | Admitting: Physical Therapy

## 2018-09-20 ENCOUNTER — Telehealth: Payer: Self-pay | Admitting: *Deleted

## 2018-09-20 NOTE — Telephone Encounter (Signed)
Contacted patient in response to MyChart message: "Do I continue to take the Tumelsin medication? Can I start seeing the dentist again? My leg pain is becoming excruciating, I have a doctor's appointment Friday." Per Dr.Kale: Inform patient that if he is having urinary symptoms, he can resume the Tamulosin, but if no symptoms can hold it. Hold off on routine dental care for 6 months after the last chemotherapy - can see dentist for any emergent care. Patient verbalized understanding.

## 2018-09-21 ENCOUNTER — Encounter: Payer: Self-pay | Admitting: Family Medicine

## 2018-09-21 ENCOUNTER — Ambulatory Visit: Payer: 59 | Admitting: Family Medicine

## 2018-09-21 ENCOUNTER — Other Ambulatory Visit: Payer: Self-pay

## 2018-09-21 VITALS — BP 149/82 | HR 85 | Temp 98.4°F | Resp 18 | Ht 72.0 in | Wt 273.6 lb

## 2018-09-21 DIAGNOSIS — C8580 Other specified types of non-Hodgkin lymphoma, unspecified site: Secondary | ICD-10-CM

## 2018-09-21 DIAGNOSIS — M2042 Other hammer toe(s) (acquired), left foot: Secondary | ICD-10-CM | POA: Diagnosis not present

## 2018-09-21 DIAGNOSIS — M898X6 Other specified disorders of bone, lower leg: Secondary | ICD-10-CM

## 2018-09-21 DIAGNOSIS — M25561 Pain in right knee: Secondary | ICD-10-CM | POA: Diagnosis not present

## 2018-09-21 DIAGNOSIS — L602 Onychogryphosis: Secondary | ICD-10-CM | POA: Diagnosis not present

## 2018-09-21 DIAGNOSIS — M2041 Other hammer toe(s) (acquired), right foot: Secondary | ICD-10-CM | POA: Diagnosis not present

## 2018-09-21 MED ORDER — MELOXICAM 7.5 MG PO TABS: 7.5000 mg | ORAL_TABLET | Freq: Every day | ORAL | 0 refills | Status: DC

## 2018-09-21 NOTE — Patient Instructions (Addendum)
Although your exam is reassuring in the office, I will obtain an x-ray of the knee and lower leg.  That can be performed at [facility] on [address].  Meloxicam once per day but watch blood pressure and if readings over 140/90, stop the meloxicam.  Tylenol over-the-counter is okay to take with meloxicam during the day, but if more severe pain, especially at bedtime, can change from Tylenol to tramadol.  Can also try over-the-counter glucosamine/chondroitin in case some of this may be from arthritis.  Follow-up with me in 2 weeks, but should have some more information on your x-rays during that time and likely will refer you to ortho.   Return to the clinic or go to the nearest emergency room if any of your symptoms worsen or new symptoms occur.   If you have lab work done today you will be contacted with your lab results within the next 2 weeks.  If you have not heard from [place] then please contact us. The fastest way to get your results is to register for My Chart.   IF you received an x-ray today, you will receive an invoice from [facility]. Please contact [facility] at [phone] with questions or concerns regarding your invoice.   IF you received labwork today, you will receive an invoice from [facility]. Please contact [facility] at [phone] with questions or concerns regarding your invoice.   Our billing staff will not be able to assist you with questions regarding bills from these companies.  You will be contacted with the lab results as soon as they are available. The fastest way to get your results is to activate your My Chart account. Instructions are located on the last page of this paperwork. If you have not heard from [place] regarding the results in 2 weeks, please contact this office.

## 2018-09-21 NOTE — Progress Notes (Signed)
Subjective:    Patient ID: Jesus [name], male    DOB: 06-07-1955, 63 y.o.   MRN: [id number]  HPI Jesus [name] is a 63 y.o. male Presents today for: Chief Complaint  Patient presents with  . Leg Pain    right leg is having some pain in knee and feels stiff since last year.    [name] is a new patient to me, here today for primarily evaluation of right leg pain.  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 Hemtologist Dr. Irene Limbo.  Most recent visit 9 days ago.  Note reviewed.  Pain had been reported at the front of his RIGHT lower leg along his shin, worse with walking, no known trauma, was able to mow his lawn over the previous weekend and the pain increased after that activity.  Apparently no calf pain at that time and his strength was coming back with increase in his activity levels.  Reportedly he had some prior LEFT lower extremity swelling that was improving at that visit.  Reportedly had negative ultrasound in left lower extremity for DVT.  Swelling was thought to be from venous compression  phallic obstruction from bulky left inguinal hemi-pelvic and retroperitoneal adenopathy.  Hypertension was slightly elevated and setting of improved p.o. intake and steroids.  Was continued on lisinopril 20 mg, increase amlodipine to 10 mg, continued on Flomax for BPH by Dr. Irene Limbo  Today, notes R knee pain. Present for 1 year. NKI. No ortho eval. No xrays. Has taken ibuprofen and muscle relaxers (4 months ago).  Recently tried ibuprofen about a week ago - 820m twice - no relief.   Pain off and on, not there all the time, but when there can impact sleep. Not locking or giving way, but stiff in knee and radiates to shin. Hurts to walk at times. No swelling seen in knee. No prior knee surgery. Had varicose vein injections, not knee injections. Cutting grass, but has to take breaks due to pain, or when  standing.  Plans on starting PT next week.   Has noticed decrease in muscle bulk since chemo.  Has ultram at home - min relief. Tylenol min relief with prn use. Has meloxicam - min relief with 1-2 doses.   BP 110/85.   Lab Results  Component Value Date   CREATININE 1.00 09/12/2018    Patient Active Problem List   Diagnosis Date Noted  . Large cell (diffuse) non-Hodgkin's lymphoma ([facility] 05/10/2018  . Bacteremia due to Escherichia coli   . HTN (hypertension) 04/17/2018  . RLS (restless legs syndrome) 04/17/2018  . Abnormal LFTs 04/17/2018  . Acute metabolic encephalopathy 186/76/1950 . AKI (acute kidney injury) ([place] 04/16/2018  . Dehydration   . Fever, unspecified   . Sepsis ([facility]   . Disorientation   . Hypokalemia   . Hypomagnesemia   . Anemia   . Encounter for antineoplastic chemotherapy   . At high risk of tumor lysis syndrome   . Swelling of lower leg   . Diffuse large B cell lymphoma ([facility] 01/15/2018  . 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[facility] 01/12/2018  . Counseling regarding advance care planning and goals of care 01/12/2018  . Bilateral leg pain 05/27/2014   Past Medical History:  Diagnosis Date  . Allergy   . History of kidney stones   . Hyperlipidemia   .  Hypertension   . Lymphadenopathy   . Pain, lower leg    Bilateral  . Peripheral arterial disease ([place])   . Pre-diabetes   . Red-green color blindness   . Snores   . Wears glasses    Past Surgical History:  Procedure Laterality Date  . CATARACT EXTRACTION W/ INTRAOCULAR LENS  IMPLANT, BILATERAL    . COLONOSCOPY    . dislodged salava stone    . FRACTURE SURGERY    . HAND ARTHROPLASTY  1995   crushed left hand  . INGUINAL LYMPH NODE BIOPSY Left 01/02/2018   Procedure: LEFT INGUINAL LYMPH NODE BIOPSY;  Surgeon: Rolm Bookbinder, MD;  Location: [place];  Service: General;  Laterality: Left;  . IR IMAGING GUIDED PORT INSERTION  01/15/2018  . MICROLARYNGOSCOPY Left  01/17/2014   Procedure: MICROLARYNGOSCOPY WITH EXCISION OF THE BIOPSY OF LEFT VOCAL CORD LESION;  Surgeon: Izora Gala, MD;  Location: [place];  Service: ENT;  Laterality: Left;  . ORIF FOOT FRACTURE  2005   left   Allergies  Allergen Reactions  . Bee Venom Anaphylaxis   Prior to Admission medications   Medication Sig Start Date End Date Taking? Authorizing Provider  amLODipine (NORVASC) 10 MG tablet Take 1 tablet (10 mg total) by mouth daily. 05/15/18   Brunetta Genera, MD  aspirin 81 MG tablet Take 81 mg by mouth daily.    [provider]  B-COMPLEX-C PO Take by mouth.    [provider]  dexamethasone (DECADRON) 4 MG tablet Take 2 tablets (8 mg total) by mouth 2 (two) times daily with a meal. Take two times a day starting the day after chemotherapy for 3 days. Patient not taking: Reported on 07/13/2018 06/01/18   Brunetta Genera, MD  hydrocortisone (ANUSOL-HC) 2.5 % rectal cream Place 1 application rectally 2 (two) times daily as needed for hemorrhoids. Patient not taking: Reported on 05/28/2018 01/19/18   Brunetta Genera, MD  lisinopril (PRINIVIL,ZESTRIL) 20 MG tablet Take 1 tablet (20 mg total) by mouth daily. 05/14/18   Brunetta Genera, MD  prochlorperazine (COMPAZINE) 10 MG tablet Take 1 tablet (10 mg total) by mouth every 6 (six) hours as needed (Nausea or vomiting). Patient not taking: Reported on 05/28/2018 01/19/18   Brunetta Genera, MD  sulfamethoxazole-trimethoprim (BACTRIM DS,SEPTRA DS) 800-160 MG tablet Take 1 tablet by mouth daily. 06/01/18   Brunetta Genera, MD  tamsulosin (FLOMAX) 0.4 MG CAPS capsule Take 1 capsule (0.4 mg total) by mouth daily after supper. 04/13/18   Brunetta Genera, MD   Social History   Socioeconomic History  . Marital status: Divorced    Spouse name: Not on file  . Number of children: 3  . Years of education: Not on file  . Highest education level: Not on file  Occupational History  . Not  on file  Social Needs  . Financial resource strain: Not on file  . Food insecurity:    Worry: Not on file    Inability: Not on file  . Transportation needs:    Medical: No    Non-medical: No  Tobacco Use  . Smoking status: Current Every Day Smoker    Packs/day: 0.50    Years: 36.00    Pack years: 18.00    Types: Cigarettes  . Smokeless tobacco: Never Used  . Tobacco comment: he denies smoking in about 2 weeks 07/13/18  Substance and Sexual Activity  . Alcohol use: Not Currently    Alcohol/week:  15.0 standard drinks    Types: 10 Cans of beer, 5 Shots of liquor per week    Comment: weekends, he denies current alcohol use 07/13/18  . Drug use: Not Currently    Types: Cocaine    Comment: reports cocaine usage ~2X/ month; last use 12/26/17  . Sexual activity: Not on file  Lifestyle  . Physical activity:    Days per week: Not on file    Minutes per session: Not on file  . Stress: Not on file  Relationships  . Social connections:    Talks on phone: Not on file    Gets together: Not on file    Attends religious service: Not on file    Active member of club or organization: Not on file    Attends meetings of clubs or organizations: Not on file    Relationship status: Not on file  . Intimate partner violence:    Fear of current or ex partner: No    Emotionally abused: No    Physically abused: No    Forced sexual activity: No  Other Topics Concern  . Not on file  Social History Narrative  . Not on file    Review of Systems Per HPI    Objective:   Physical Exam Constitutional:      General: He is not in acute distress.    Appearance: He is well-developed.  HENT:     Head: Normocephalic and atraumatic.  Cardiovascular:     Rate and Rhythm: Normal rate.  Pulmonary:     Effort: Pulmonary effort is normal.  Musculoskeletal:     Right knee: He exhibits normal range of motion, no swelling, no effusion, no ecchymosis, no deformity, no LCL laxity, normal patellar mobility, no  bony tenderness, normal meniscus and no MCL laxity. No tenderness found. No medial joint line and no lateral joint line tenderness noted.     Right ankle: He exhibits no swelling. No tenderness. Achilles tendon normal.     Right lower leg: He exhibits bony tenderness (diffuse anterior tibial ttp, and some to medial tibial mm attachment. no swelling/rash.  diffuse pain with vibratory fork testing. ). He exhibits no swelling (calf nt, negative homan. ). No edema.       Legs:  Neurological:     Mental Status: He is alert and oriented to person, place, and time.    Vitals:   09/21/18 1510  BP: (!) 149/82  Pulse: 85  Resp: 18  Temp: 98.4 F (36.9 C)  TempSrc: Oral  SpO2: 97%  Weight: 273 lb 9.6 oz (124.1 kg)  Height: 6' (1.829 m)   Dg Tibia/fibula Right  Result Date: 09/24/2018 CLINICAL DATA:  Chronic right lower leg pain without known injury. EXAM: RIGHT TIBIA AND FIBULA - 2 VIEW COMPARISON:  None. FINDINGS: There is no evidence of fracture or other focal bone lesions. Soft tissues are unremarkable. IMPRESSION: Negative. Electronically Signed   By: Marijo Conception M.D.   On: 09/24/2018 16:06       Assessment & Plan:   KAHLEL PEAKE is a 63 y.o. male Right knee pain, unspecified chronicity - Plan: meloxicam (MOBIC) 7.5 MG tablet, DG Knee 1-2 Views Right, CANCELED: DG Knee 1-2 Views Right  Pain of right tibia - Plan: meloxicam (MOBIC) 7.5 MG tablet, DG Tibia/Fibula Right, CANCELED: DG Tibia/Fibula Right  Large cell lymphoma (HCC)   Overall reassuring exam, but based on duration of symptoms, other history, will check x-ray of both knee and  tib-fib.  It appears the knee x-ray was canceled, no concerning findings on tib-fib x-ray.  We will try to schedule x-ray of the knee for further evaluation.   -Meloxicam 7.5 mg daily as needed, but monitor blood pressure readings and cessation discussed if elevated.  Avoid combining with other NSAIDs.  Tylenol over-the-counter as needed,  over-the-counter glucosamine/chondroitin trial. Upcoming PT may also help  -Recheck 2 weeks to decide next step or possible orthopedic eval.   Meds ordered this encounter  Medications  . meloxicam (MOBIC) 7.5 MG tablet    Sig: Take 1 tablet (7.5 mg total) by mouth daily.    Dispense:  30 tablet    Refill:  0   Patient Instructions   Although your exam is reassuring in the office, I will obtain an x-ray of the knee and lower leg.  That can be performed at [facility] on [facility].  Meloxicam once per day but watch blood pressure and if readings over 140/90, stop the meloxicam.  Tylenol over-the-counter is okay to take with meloxicam during the day, but if more severe pain, especially at bedtime, can change from Tylenol to tramadol.  Can also try over-the-counter glucosamine/chondroitin in case some of this may be from arthritis.  Follow-up with me in 2 weeks, but should have some more information on your x-rays during that time and likely will refer you to ortho.   Return to the clinic or go to the nearest emergency room if any of your symptoms worsen or new symptoms occur.   If you have lab work done today you will be contacted with your lab results within the next 2 weeks.  If you have not heard from [place] then please contact us. The fastest way to get your results is to register for My Chart.   IF you received an x-ray today, you will receive an invoice from [facility]. Please contact [facility] at [phone] with questions or concerns regarding your invoice.   IF you received labwork today, you will receive an invoice from [place]. Please contact [facility] at [phone] with questions or concerns regarding your invoice.   Our billing staff will not be able to assist you with questions regarding bills from these companies.  You will be contacted with the lab results as soon as they are available. The fastest way to get your results is to activate your My  Chart account. Instructions are located on the last page of this paperwork. If you have not heard from [place] regarding the results in 2 weeks, please contact this office.      Signed,   Merri Ray, MD [facility].  09/30/18 10:17 PM

## 2018-09-24 ENCOUNTER — Ambulatory Visit
Admission: RE | Admit: 2018-09-24 | Discharge: 2018-09-24 | Disposition: A | Discharge status: Home / Self Care | Payer: 59 | Source: Ambulatory Visit | Attending: Family Medicine | Admitting: Family Medicine

## 2018-09-24 DIAGNOSIS — M25561 Pain in right knee: Secondary | ICD-10-CM

## 2018-09-24 DIAGNOSIS — M898X6 Other specified disorders of bone, lower leg: Secondary | ICD-10-CM

## 2018-09-24 DIAGNOSIS — M79604 Pain in right leg: Secondary | ICD-10-CM | POA: Diagnosis not present

## 2018-09-24 IMAGING — CR RIGHT TIBIA AND FIBULA - 2 VIEW
4 series · 4 of 4 positions shown · non-contrast
Comparison: None.

CLINICAL DATA: Chronic right lower leg pain without known injury.

EXAM:
RIGHT TIBIA AND FIBULA - 2 VIEW

[t tib/fib ap right (1 of 2)]
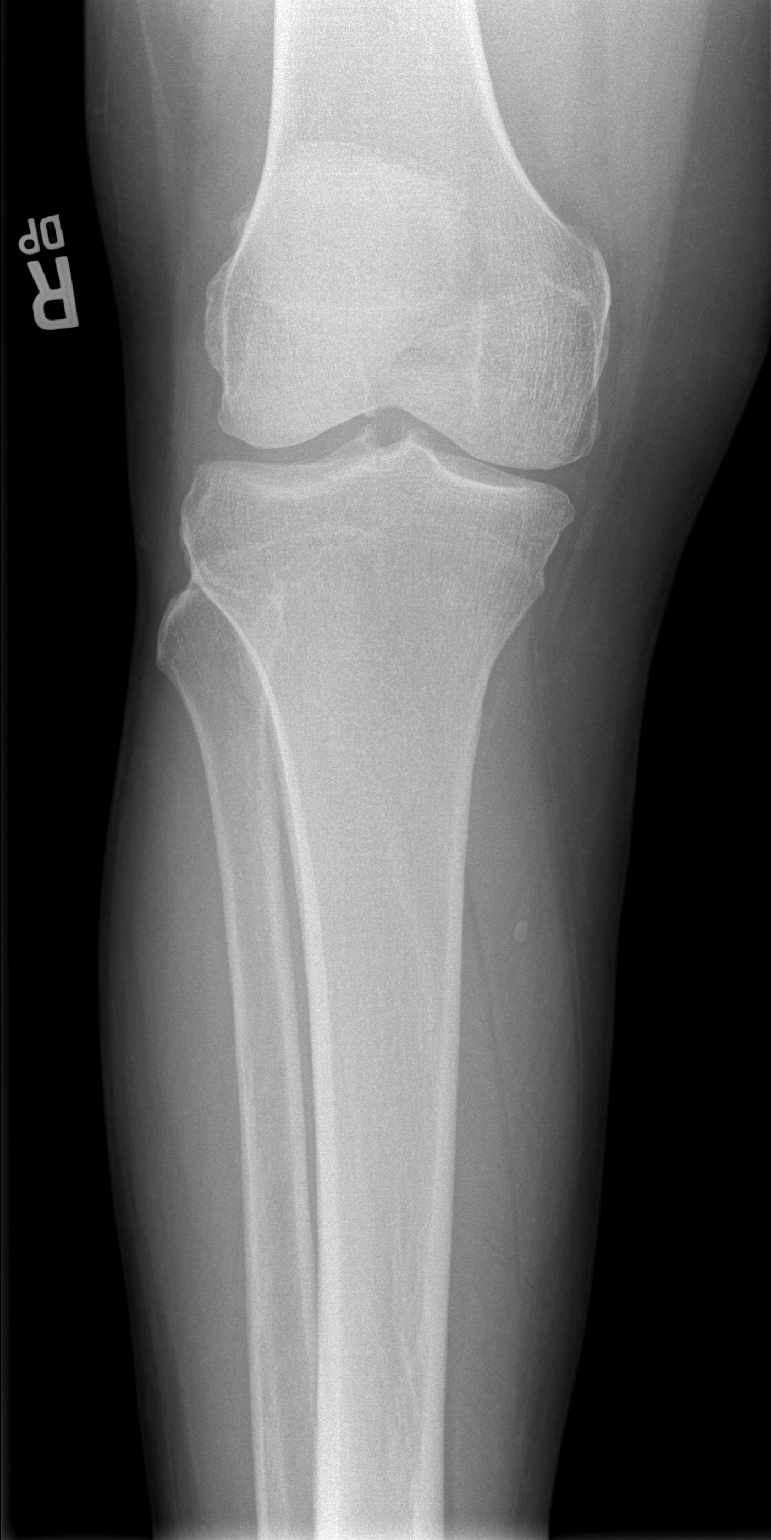

[t tib/fib ap right (2 of 2)]
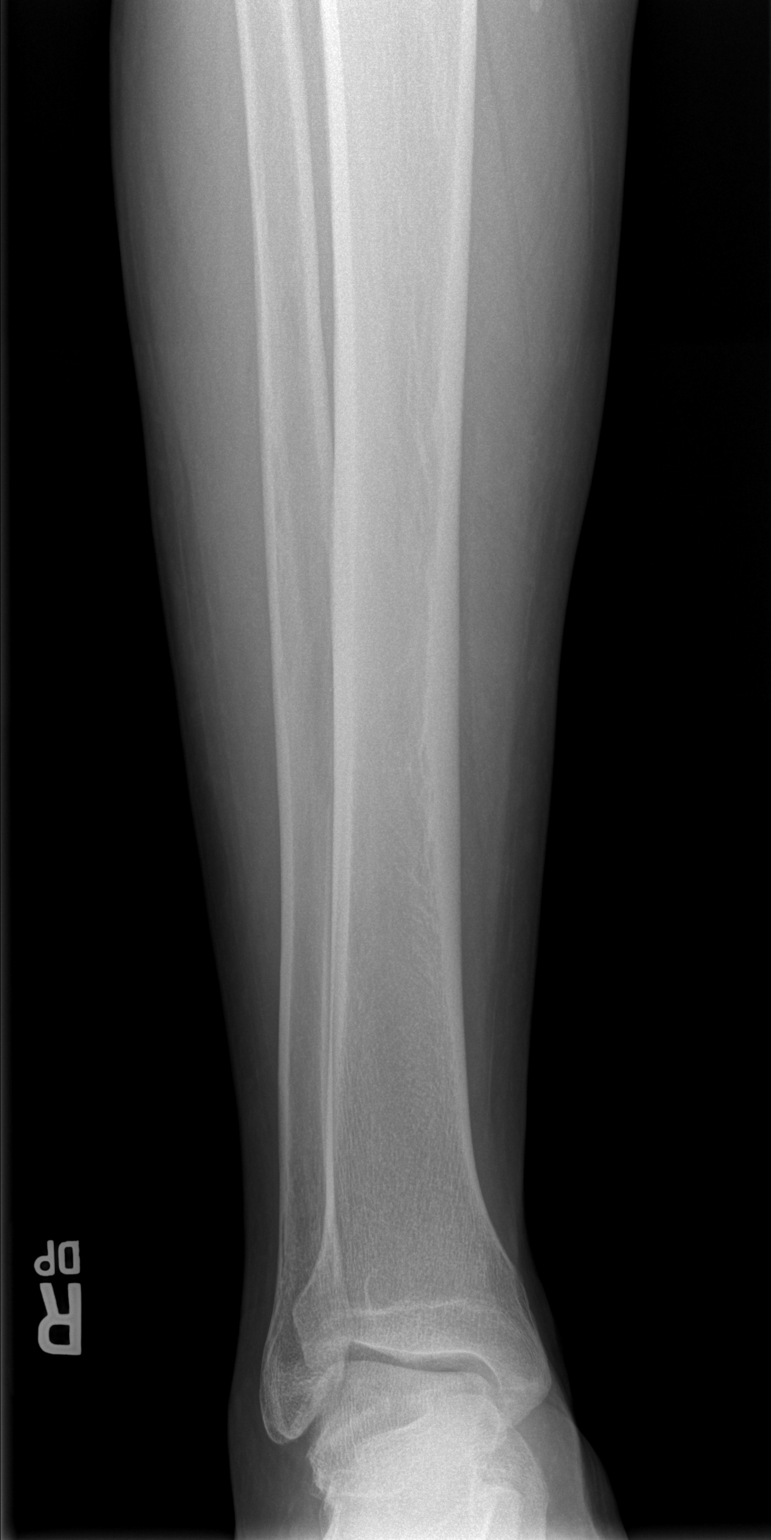

[t tib/fib lat right (1 of 2)]
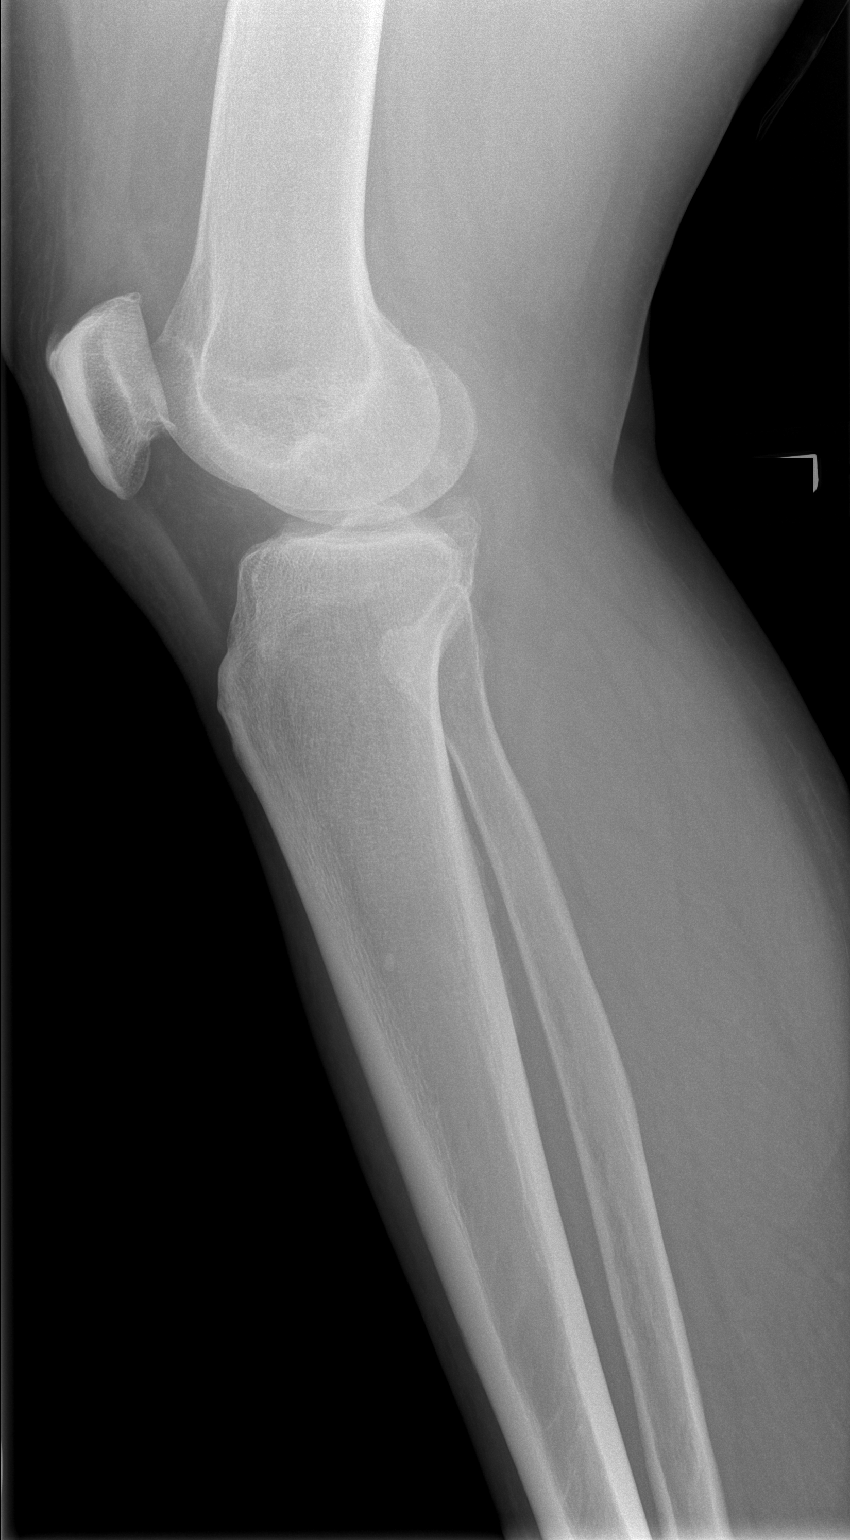

[t tib/fib lat right (2 of 2)]
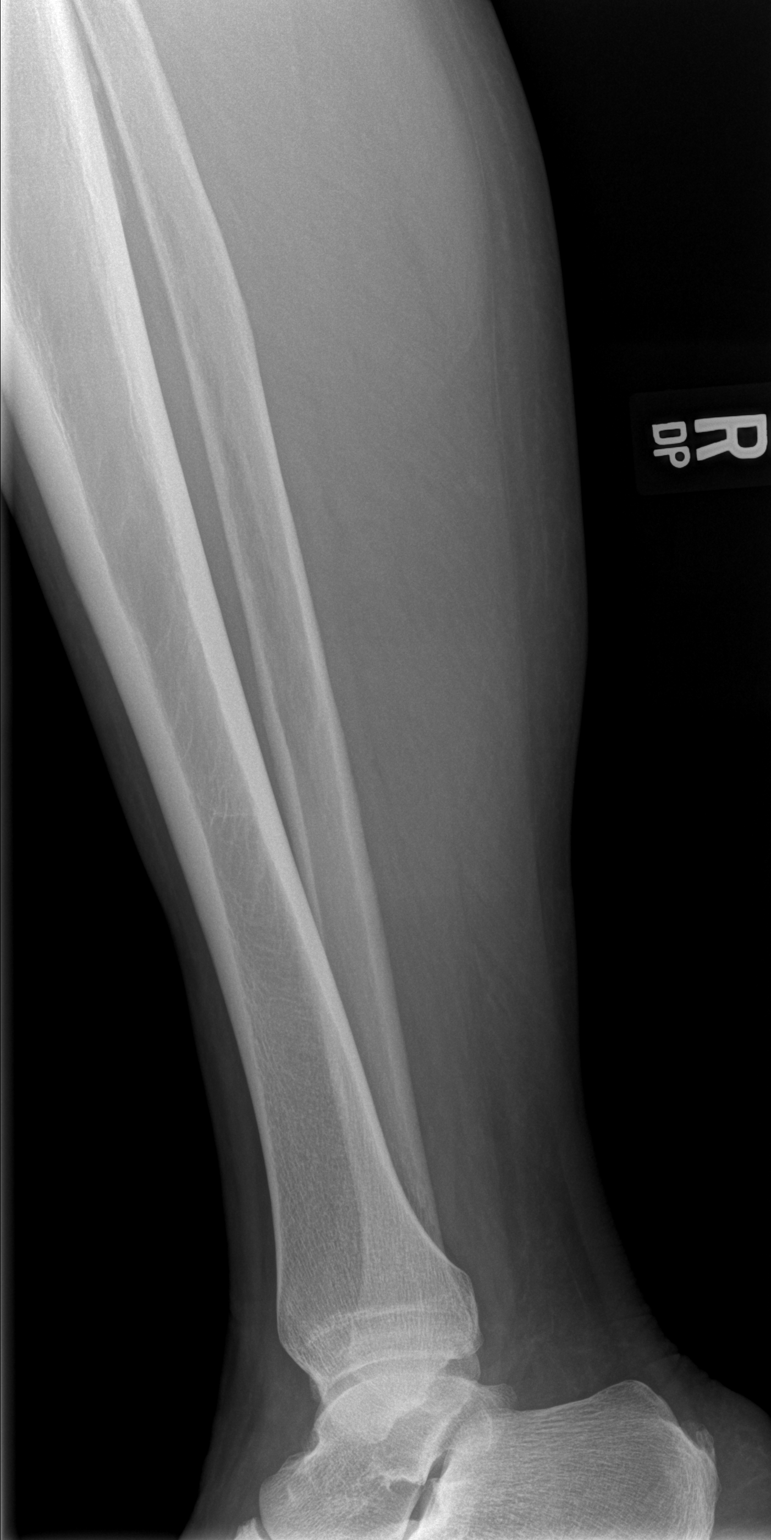

[4 of 4 positions shown; findings below may reference images not displayed]

FINDINGS: There is no evidence of fracture or other focal bone lesions. Soft
tissues are unremarkable.
IMPRESSION: Negative.

## 2018-09-26 NOTE — Progress Notes (Signed)
I called the patient today about their upcoming follow-up appointment in radiation oncology.   Given concerns about the COVID-19 pandemic, I offered a phone assessment with the patient to determine if coming to the clinic was necessary. The patient accepted.  I let the patient know that I had spoken with Dr. Isidore Moos, and she wanted them to know the importance of washing their hands for at least 20 seconds at a time, especially after going out in public, and before they eat. Limit going out in public whenever possible. Do not touch your face, unless your hands are clean, such as when bathing. Get plenty of rest, eat well, and stay hydrated.   Symptomatically, the patient is doing relatively well. They report no issues related to radiation treatment.   All questions were answered to the patient's satisfaction.  I encouraged the patient to call with any further questions. Otherwise, the plan is follow up with medical oncology long term and follow up with Dr. Isidore Moos as needed.    Patient is pleased with this plan, and we will cancel their upcoming follow-up to reduce the risk of COVID-19 transmission.

## 2018-09-27 NOTE — Progress Notes (Signed)
  Patient Name: Jesus [name] MRN: [id number] DOB: 30-Jan-1956 Referring Physician: Sullivan Lone (Profile Not Attached) Date of Service: 08/23/2018 [facility], [place]                                                        End Of Treatment Note  Diagnoses: C83.38-Diffuse large b-cell lymphoma, lymph nodes of multiple sites  Cancer Staging Diffuse large B-cell lymphoma of lymph nodes of multiple regions [facility]) Staging form: Hodgkin and Non-Hodgkin Lymphoma, AJCC 8th Edition - Clinical: Stage IV - Signed by Eppie Gibson, MD on 07/13/2018  Intent: Curative  Radiation Treatment Dates: 07/25/2018 through 08/23/2018 Site Technique Total Dose Dose per Fx Completed Fx Beam Energies  Pelvis: Pelvis IMRT 39.6/39.6 1.8 22/22 6X   Narrative: The patient tolerated radiation therapy very well. He reported no side effects or issues with this treatment  Plan: The patient will follow-up with radiation oncology in one month.  ________________________________________________  Eppie Gibson, MD  This document serves as a record of services personally performed by Eppie Gibson, MD. It was created on her behalf by Rae Lips, a trained medical scribe. The creation of this record is based on the scribe's personal observations and the provider's statements to them. This document has been checked and approved by the attending provider.

## 2018-09-28 ENCOUNTER — Ambulatory Visit
Admission: RE | Admit: 2018-09-28 | Discharge: 2018-09-28 | Disposition: A | Discharge status: Home / Self Care | Payer: 59 | Source: Ambulatory Visit | Attending: Radiation Oncology | Admitting: Radiation Oncology

## 2018-10-03 ENCOUNTER — Other Ambulatory Visit: Payer: Self-pay

## 2018-10-03 ENCOUNTER — Telehealth (INDEPENDENT_AMBULATORY_CARE_PROVIDER_SITE_OTHER): Payer: 59 | Admitting: Family Medicine

## 2018-10-03 DIAGNOSIS — M898X6 Other specified disorders of bone, lower leg: Secondary | ICD-10-CM

## 2018-10-03 DIAGNOSIS — M25561 Pain in right knee: Secondary | ICD-10-CM

## 2018-10-03 NOTE — Progress Notes (Signed)
Cc- Leg pain- Lower Right  leg pain this been going on for year. Was last seen here on 09/21/2018 was sent for xray's, was put on mobic and otc tylenol. Last visit did mention putting in a referral for Ortho.

## 2018-10-03 NOTE — Progress Notes (Signed)
Virtual Visit via Telephone Note  I connected with [name] on 10/03/18 at 11:28 AM by telephone and verified that I am speaking with the correct person using two identifiers.   I discussed the limitations, risks, security and privacy concerns of performing an evaluation and management service by telephone and the availability of in person appointments. I also discussed with the patient that there may be a patient responsible charge related to this service. The patient expressed understanding and agreed to proceed, consent obtained  Chief complaint:  Follow-up right leg pain  History of Present Illness: NASEAN ZAPF is a 63 y.o. male  Right leg pain: Evaluated 12 days ago in office, longstanding symptoms.  History of large cell lymphoma.  Imaging was obtained although reassuring exam.  Tib-fib x-ray completed without concerning findings, but dedicated knee x-ray was not performed (images were reviewed, I do see where AP and lateral views of knee were obtained with tib-fib, without apparent significant bony findings.) Meloxicam 7.5 mg daily was given as an option along with over-the-counter glucosamine or chondroitin and was planning on physical therapy.   Still with same pain in R knee, similar for years, but worse over past few months. Sometimes pain lets up then comes back. Limping d/t pain at times.  No change with meloxicam once per day, chondroitin and glucosamine.  Taking tramadol as needed - about 4 per day. No prior ortho eval.  No change in pain since last visit.    Patient Active Problem List   Diagnosis Date Noted  . Large cell (diffuse) non-Hodgkin's lymphoma ([place]) 05/10/2018  . Bacteremia due to Escherichia coli   . HTN (hypertension) 04/17/2018  . RLS (restless legs syndrome) 04/17/2018  . Abnormal LFTs 04/17/2018  . Acute metabolic encephalopathy 69/62/9528  . AKI (acute kidney injury) ([place]) 04/16/2018  . Dehydration   . Fever, unspecified   . Sepsis  ([place])   . Disorientation   . Hypokalemia   . Hypomagnesemia   . Anemia   . Encounter for antineoplastic chemotherapy   . At high risk of tumor lysis syndrome   . Swelling of lower leg   . Diffuse large B cell lymphoma ([place]) 01/15/2018  . 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[place]) 01/12/2018  . Counseling regarding advance care planning and goals of care 01/12/2018  . Bilateral leg pain 05/27/2014   Past Medical History:  Diagnosis Date  . Allergy   . History of kidney stones   . Hyperlipidemia   . Hypertension   . Lymphadenopathy   . Pain, lower leg    Bilateral  . Peripheral arterial disease ([place])   . Pre-diabetes   . Red-green color blindness   . Snores   . Wears glasses    Past Surgical History:  Procedure Laterality Date  . CATARACT EXTRACTION W/ INTRAOCULAR LENS  IMPLANT, BILATERAL    . COLONOSCOPY    . dislodged salava stone    . FRACTURE SURGERY    . HAND ARTHROPLASTY  1995   crushed left hand  . INGUINAL LYMPH NODE BIOPSY Left 01/02/2018   Procedure: LEFT INGUINAL LYMPH NODE BIOPSY;  Surgeon: Rolm Bookbinder, MD;  Location: [place];  Service: General;  Laterality: Left;  . IR IMAGING GUIDED PORT INSERTION  01/15/2018  . MICROLARYNGOSCOPY Left 01/17/2014   Procedure: MICROLARYNGOSCOPY WITH EXCISION OF THE BIOPSY OF LEFT VOCAL CORD LESION;  Surgeon: Izora Gala, MD;  Location: [place];  Service: ENT;  Laterality: Left;  . ORIF FOOT FRACTURE  2005   left   Allergies  Allergen Reactions  . Bee Venom Anaphylaxis   Prior to Admission medications   Medication Sig Start Date End Date Taking? Authorizing Provider  amLODipine (NORVASC) 10 MG tablet Take 1 tablet (10 mg total) by mouth daily. 05/15/18  Yes Brunetta Genera, MD  aspirin 81 MG tablet Take 81 mg by mouth daily.   Yes [provider]  B-COMPLEX-C PO Take by mouth.   Yes [provider]  lisinopril (PRINIVIL,ZESTRIL) 20 MG tablet Take 1 tablet  (20 mg total) by mouth daily. 05/14/18  Yes Brunetta Genera, MD  meloxicam (MOBIC) 7.5 MG tablet Take 1 tablet (7.5 mg total) by mouth daily. 09/21/18  Yes Wendie Agreste, MD   Social History   Socioeconomic History  . Marital status: Divorced    Spouse name: Not on file  . Number of children: 3  . Years of education: Not on file  . Highest education level: Not on file  Occupational History  . Not on file  Social Needs  . Financial resource strain: Not on file  . Food insecurity:    Worry: Not on file    Inability: Not on file  . Transportation needs:    Medical: No    Non-medical: No  Tobacco Use  . Smoking status: Current Every Day Smoker    Packs/day: 0.50    Years: 36.00    Pack years: 18.00    Types: Cigarettes  . Smokeless tobacco: Never Used  . Tobacco comment: he denies smoking in about 2 weeks 07/13/18  Substance and Sexual Activity  . Alcohol use: Not Currently    Alcohol/week: 15.0 standard drinks    Types: 10 Cans of beer, 5 Shots of liquor per week    Comment: weekends, he denies current alcohol use 07/13/18  . Drug use: Not Currently    Types: Cocaine    Comment: reports cocaine usage ~2X/ month; last use 12/26/17  . Sexual activity: Not on file  Lifestyle  . Physical activity:    Days per week: Not on file    Minutes per session: Not on file  . Stress: Not on file  Relationships  . Social connections:    Talks on phone: Not on file    Gets together: Not on file    Attends religious service: Not on file    Active member of club or organization: Not on file    Attends meetings of clubs or organizations: Not on file    Relationship status: Not on file  . Intimate partner violence:    Fear of current or ex partner: No    Emotionally abused: No    Physically abused: No    Forced sexual activity: No  Other Topics Concern  . Not on file  Social History Narrative  . Not on file     Observations/Objective: X-ray images reviewed and discussed with  patient No distress on phone, appropriate responses, all questions answered  Assessment and Plan: Pain of right tibia - Plan: Ambulatory referral to Orthopedic Surgery  Right knee pain, unspecified chronicity - Plan: Ambulatory referral to Orthopedic Surgery Persistent right knee pain with reported interval worsening past few months.  Degenerative meniscal disease possible, x-rays were reviewed and although dedicated knee x-rays not performed, I do not see any specific concerns on AP and lateral knee on tib-fib views.  Minimal change with meloxicam, continued on tramadol for pain.  -  Refer to orthopedics for further evaluation and decision on advanced imaging versus injection.  RTC precautions if acute worsening.  Follow Up Instructions: With ortho. Prn sooner if worse/new sx's.    I discussed the assessment and treatment plan with the patient. The patient was provided an opportunity to ask questions and all were answered. The patient agreed with the plan and demonstrated an understanding of the instructions.   The patient was advised to call back or seek an in-person evaluation if the symptoms worsen or if the condition fails to improve as anticipated.  I provided 8 minutes of non-face-to-face time during this encounter.  Signed,   Merri Ray, MD [facility].  10/03/18

## 2018-10-03 NOTE — Patient Instructions (Addendum)
  I do not see any concerning findings on your knee or tibia/fibula x-ray.  However with that persistent knee pain and worsening pain past few months, you could have some potential arthritis within the knee or wear and tear of the meniscus or shock absorber of the knee.  I will refer you to orthopedics to decide on advanced imaging such as an MRI or a trial of injection initially.  If you have not heard from them within 2 weeks, please let me know, and let me know if I can help in the meantime.  Return to the clinic or go to the nearest emergency room if any of your symptoms worsen or new symptoms occur.    If you have lab work done today you will be contacted with your lab results within the next 2 weeks.  If you have not heard from [place] then please contact us. The fastest way to get your results is to register for My Chart.   IF you received an x-ray today, you will receive an invoice from [facility]. Please contact [facility] Radiology at [phone] with questions or concerns regarding your invoice.   IF you received labwork today, you will receive an invoice from [place]. Please contact [facility] at [phone] with questions or concerns regarding your invoice.   Our billing staff will not be able to assist you with questions regarding bills from these companies.  You will be contacted with the lab results as soon as they are available. The fastest way to get your results is to activate your My Chart account. Instructions are located on the last page of this paperwork. If you have not heard from [place] regarding the results in 2 weeks, please contact this office.

## 2018-10-14 ENCOUNTER — Other Ambulatory Visit: Payer: Self-pay | Admitting: Family Medicine

## 2018-10-14 DIAGNOSIS — M25561 Pain in right knee: Secondary | ICD-10-CM

## 2018-10-14 DIAGNOSIS — M898X6 Other specified disorders of bone, lower leg: Secondary | ICD-10-CM

## 2018-10-14 NOTE — Telephone Encounter (Signed)
Please clarify whether he wants meloxicam refill.  When I saw him last, he had noted that the meloxicam did not make significant difference in symptoms.  If not helping, then would not recommend using that medication, and can discuss with orthopedics.  If he has had some relief, let me know and I will send a refill.

## 2018-10-14 NOTE — Telephone Encounter (Signed)
Forwarding medication refill to provider for review. 

## 2018-10-15 NOTE — Telephone Encounter (Signed)
Patient stated this med do nt do that much for him. He wasn't to see if you would be ok with sending him in  naproxon cause this has helped in the past.

## 2018-10-17 ENCOUNTER — Other Ambulatory Visit: Payer: Self-pay

## 2018-10-17 ENCOUNTER — Encounter: Payer: Self-pay | Admitting: Orthopaedic Surgery

## 2018-10-17 ENCOUNTER — Ambulatory Visit (INDEPENDENT_AMBULATORY_CARE_PROVIDER_SITE_OTHER): Payer: 59 | Admitting: Orthopaedic Surgery

## 2018-10-17 DIAGNOSIS — M25561 Pain in right knee: Secondary | ICD-10-CM | POA: Diagnosis not present

## 2018-10-17 DIAGNOSIS — G8929 Other chronic pain: Secondary | ICD-10-CM | POA: Insufficient documentation

## 2018-10-17 MED ORDER — BUPIVACAINE HCL 0.25 % IJ SOLN
2.0000 mL | INTRAMUSCULAR | Status: AC | PRN
  Administered 2018-10-17: 2 mL via INTRA_ARTICULAR

## 2018-10-17 MED ORDER — METHYLPREDNISOLONE ACETATE 40 MG/ML IJ SUSP
40.0000 mg | INTRAMUSCULAR | Status: AC | PRN
  Administered 2018-10-17: 40 mg via INTRA_ARTICULAR

## 2018-10-17 MED ORDER — NAPROXEN 500 MG PO TABS: 500.0000 mg | ORAL_TABLET | Freq: Two times a day (BID) | ORAL | 0 refills | Status: DC

## 2018-10-17 MED ORDER — LIDOCAINE HCL 1 % IJ SOLN
2.0000 mL | INTRAMUSCULAR | Status: AC | PRN
  Administered 2018-10-17: 2 mL

## 2018-10-17 NOTE — Telephone Encounter (Signed)
Short course of naprosyn given, should monitor blood pressure on that medication, and should not be using long-term as potential risks and side effects of that medication.  I can discuss this further with him if needed.  If any stomach upset/abdominal pain or new symptoms start that medicine and let me know right away.  It does appear that orthopedics has tried to call and schedule his appointment.  Please have him call Ortho as we will need to discuss further treatment for knee pain at that visit.  If persistent anti-inflammatory needed, would recommend topical NSAID instead of oral NSAID.  Let me know if there are questions.

## 2018-10-17 NOTE — Progress Notes (Signed)
Office Visit Note   Patient: Jesus [name]           Date of Birth: November 21, 1955           MRN: [id number] Visit Date: 10/17/2018              Requested by: Wendie Agreste, MD [address] [place], [place] [address] PCP: Shirline Frees, MD   Assessment & Plan: Visit Diagnoses:  1. Chronic pain of right knee     Plan: Impression is right knee osteoarthritis.  We will inject his right knee with cortisone today.  Should he not notice any improvemen 2 to 4 weeks from now, he will call us and let us know we will look into further imaging.  Call with concerns or questions in the meantime.  Follow-Up Instructions: Return if symptoms worsen or fail to improve.   Orders:  Orders Placed This Encounter  Procedures   Large Joint Inj: R knee   No orders of the defined types were placed in this encounter.     Procedures: Large Joint Inj: R knee on 10/17/2018 4:19 PM Indications: pain Details: 22 G needle, anterolateral approach Medications: 2 mL bupivacaine 0.25 %; 2 mL lidocaine 1 %; 40 mg methylPREDNISolone acetate 40 MG/ML      Clinical Data: No additional findings.   Subjective: No chief complaint on file.   HPI patient is a pleasant 63 year old gentleman who presents our clinic today with right lower extremity pain.  This has been ongoing since 2017 following chemo therapy for B-cell lymphoma.  His pain has recently worsened.  The pain he has is primarily to the knee but radiates down his shin.  He describes this as a constant ache worse when his legs are elevated above his heart.  He does note that this occasionally wakes him from his sleep.  He has tried over-the-counter medications without relief of symptoms.  He does note numbness to his feet but this has been ongoing since finishing his chemo.  He also notes tripping to his varicose veins right lower extremity about a year ago.  Occasionally he wears compression socks.  He denies any fevers or chills.  Of note, he  just recently completed radiation treatment for his lymphoma.  Review of Systems as detailed in HPI.  All others reviewed and are negative.   Objective: Vital Signs: There were no vitals taken for this visit.  Physical Exam well-developed and well-nourished gentleman in no acute distress.  Alert oriented x3.  Ortho Exam examination of the right knee shows no effusion.  Range of motion 0 to 120 degrees.  Mild tenderness the medial and lateral joint lines.  Mild patellofemoral crepitus.  He is stable valgus varus stress.  He is neurovascular intact distally.  He does have diffuse tenderness throughout the mid tibia.  Specialty Comments:  No specialty comments available.  Imaging: Previous x-rays of the tibia/fibula to include the knee were negative for acute findings.  He does have joint space narrowing to the medial and patellofemoral compartments.   PMFS History: Patient Active Problem List   Diagnosis Date Noted   Chronic pain of right knee 10/17/2018   Large cell (diffuse) non-Hodgkin's lymphoma ([facility]) 05/10/2018   Bacteremia due to Escherichia coli    HTN (hypertension) 04/17/2018   RLS (restless legs syndrome) 04/17/2018   Abnormal LFTs 37/34/2876   Acute metabolic encephalopathy 81/15/7262   AKI (acute kidney injury) ([facility]) 04/16/2018   Dehydration    Fever, unspecified  Sepsis ([facility])    Disorientation    Hypokalemia    Hypomagnesemia    Anemia    Encounter for antineoplastic chemotherapy    At high risk of tumor lysis syndrome    Swelling of lower leg    Diffuse large B cell lymphoma ([facility]) 01/15/2018   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[facility]) 01/12/2018   Counseling regarding advance care planning and goals of care 01/12/2018   Bilateral leg pain 05/27/2014   Past Medical History:  Diagnosis Date   Allergy    History of kidney stones    Hyperlipidemia    Hypertension    Lymphadenopathy    Pain, lower  leg    Bilateral   Peripheral arterial disease ([facility])    Pre-diabetes    Red-green color blindness    Snores    Wears glasses     Family History  Problem Relation Age of Onset   Breast cancer Mother    Diabetes Father    Hypertension Father    Stroke Father    Mental illness Sister    Hypertension Daughter    Mental illness Daughter    Hypertension Brother    Colon cancer Brother    Breast cancer Sister     Past Surgical History:  Procedure Laterality Date   CATARACT EXTRACTION W/ INTRAOCULAR LENS  IMPLANT, BILATERAL     COLONOSCOPY     dislodged salava stone     FRACTURE SURGERY     HAND ARTHROPLASTY  1995   crushed left hand   INGUINAL LYMPH NODE BIOPSY Left 01/02/2018   Procedure: LEFT INGUINAL LYMPH NODE BIOPSY;  Surgeon: Rolm Bookbinder, MD;  Location: [facility];  Service: General;  Laterality: Left;   IR IMAGING GUIDED PORT INSERTION  01/15/2018   MICROLARYNGOSCOPY Left 01/17/2014   Procedure: MICROLARYNGOSCOPY WITH EXCISION OF THE BIOPSY OF LEFT VOCAL CORD LESION;  Surgeon: Izora Gala, MD;  Location: [facility];  Service: ENT;  Laterality: Left;   ORIF FOOT FRACTURE  2005   left   Social History   Occupational History   Not on file  Tobacco Use   Smoking status: Current Every Day Smoker    Packs/day: 0.50    Years: 36.00    Pack years: 18.00    Types: Cigarettes   Smokeless tobacco: Never Used   Tobacco comment: he denies smoking in about 2 weeks 07/13/18  Substance and Sexual Activity   Alcohol use: Not Currently    Alcohol/week: 15.0 standard drinks    Types: 10 Cans of beer, 5 Shots of liquor per week    Comment: weekends, he denies current alcohol use 07/13/18   Drug use: Not Currently    Types: Cocaine    Comment: reports cocaine usage ~2X/ month; last use 12/26/17   Sexual activity: Not on file

## 2018-11-11 ENCOUNTER — Other Ambulatory Visit: Payer: Self-pay | Admitting: Hematology

## 2018-11-20 NOTE — Progress Notes (Signed)
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 male is here because of left lower extremity edema and lymphadenopathy.  The patient was seen in the emergency room this past Friday for the same issue.  A CT of the abdomen and pelvis was performed showing bulky left inguinal, left hemipelvic, and retroperitoneal adenopathy.  He was referred to [place] from the emergency room for further evaluation.  Doppler ultrasound of the left lower extremity was performed and was negative for DVT. Patient reports that he has been having left lower extremity edema in his left groin and left leg for approximately 1 month.  He states that the swelling in the left groin started to get better but then worsened.  The left lower extremity edema has slowly worsened over time.  Patient denies having fevers and chills.  He reports that he does have night sweats at times.  He reported having headaches approximate 1 [place]  He thinks his headaches are related to not having his blood pressure medication.  His headaches have now resolved.  He denies visual changes.  The patient denies chest pain, shortness of breath and cough.  No nausea, vomiting, constipation, diarrhea.  Denies abdominal pain.  The patient denies recent weight loss and has actually gained weight recently.  Patient denies epistaxis, bleeding gums, hemoptysis, hematuria, but occasionally, and melena.  He reports increased urinary frequency over the past month but no dysuria.  The patient is here for evaluation and discussion of his recent CT and lab findings.  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 The patient's last visit with [place] was on 09/12/2018. [name].  The pt reports that his right leg has been hurting. He also has some numbness in his hands, in his right more than his left primarily in his fingers. He has been treating his pain with naproxen. He received an injection in the knee to help with the pain, but it did not give him any relief. He continues to maintain   Of note since the patient's last visit, pt has had a right tibia and fibula xray completed on 09/24/2018 for chronic right lower leg pain without known injury with results revealing no evidence of fracture or other focal bone lesions. Soft tissues are unremarkable.  Lab results today (11/21/18) of CBC w/diff and CMP is as follows: all values are WNL except for RBC at 3.81, hemoglobin 11.9, HCT at 35.6, abs immature granulocytes at 0.26.  On review of systems, pt reports denies leg swelling, stomach pain, change in bowel habits, breathing difficulties, weight loss, and any other symptoms.    MEDICAL HISTORY:   Past Medical History:  Diagnosis Date  . Allergy   . History of kidney stones   . Hyperlipidemia   . Hypertension   . Lymphadenopathy   . Pain, lower leg    Bilateral  . Peripheral arterial disease ([facility])   . Pre-diabetes   . Red-green color blindness   .  Snores   . Wears glasses     SURGICAL HISTORY: Past Surgical History:  Procedure Laterality Date  . CATARACT EXTRACTION W/ INTRAOCULAR LENS  IMPLANT, BILATERAL    . COLONOSCOPY    . dislodged salava stone    . FRACTURE SURGERY    . HAND ARTHROPLASTY  1995   crushed left hand  . INGUINAL LYMPH NODE BIOPSY Left 01/02/2018   Procedure: LEFT INGUINAL LYMPH NODE BIOPSY;  Surgeon: Rolm Bookbinder, MD;  Location: [facility];  Service: General;  Laterality: Left;  . IR IMAGING GUIDED PORT INSERTION  01/15/2018  .  MICROLARYNGOSCOPY Left 01/17/2014   Procedure: MICROLARYNGOSCOPY WITH EXCISION OF THE BIOPSY OF LEFT VOCAL CORD LESION;  Surgeon: Izora Gala, MD;  Location: [facility];  Service: ENT;  Laterality: Left;  . ORIF FOOT FRACTURE  2005   left    SOCIAL HISTORY: Social History   Socioeconomic History  . Marital status: Divorced    Spouse name: Not on file  . Number of children: 3  . Years of education: Not on file  . Highest education level: Not on file  Occupational History  . Not on file  Social Needs  . Financial resource strain: Not on file  . Food insecurity    Worry: Not on file    Inability: Not on file  . Transportation needs    Medical: No    Non-medical: No  Tobacco Use  . Smoking status: Current Every Day Smoker    Packs/day: 0.50    Years: 36.00    Pack years: 18.00    Types: Cigarettes  . Smokeless tobacco: Never Used  . Tobacco comment: he denies smoking in about 2 weeks 07/13/18  Substance and Sexual Activity  . Alcohol use: Not Currently    Alcohol/week: 15.0 standard drinks    Types: 10 Cans of beer, 5 Shots of liquor per week    Comment: weekends, he denies current alcohol use 07/13/18  . Drug use: Not Currently    Types: Cocaine    Comment: reports cocaine usage ~2X/ month; last use 12/26/17  . Sexual activity: Not on file  Lifestyle  . Physical activity    Days per week: Not on file    Minutes per session: Not on file  . Stress: Not on file  Relationships  . Social Herbalist on phone: Not on file    Gets together: Not on file    Attends religious service: Not on file    Active member of club or organization: Not on file    Attends meetings of clubs or organizations: Not on file    Relationship status: Not on file  . Intimate partner violence    Fear of current or ex partner: No    Emotionally abused: No    Physically abused: No    Forced sexual activity: No  Other Topics Concern  . Not on file  Social History Narrative   . Not on file    FAMILY HISTORY: Family History  Problem Relation Age of Onset  . Breast cancer Mother   . Diabetes Father   . Hypertension Father   . Stroke Father   . Mental illness Sister   . Hypertension Daughter   . Mental illness Daughter   . Hypertension Brother   . Colon cancer Brother   . Breast cancer Sister     ALLERGIES:  is allergic to bee venom.  MEDICATIONS:  Current Outpatient Medications  Medication  Sig Dispense Refill  . amLODipine (NORVASC) 10 MG tablet Take 1 tablet (10 mg total) by mouth daily. 30 tablet 2  . aspirin 81 MG tablet Take 81 mg by mouth daily.    . B-COMPLEX-C PO Take by mouth.    Marland Kitchen lisinopril (PRINIVIL,ZESTRIL) 20 MG tablet Take 1 tablet (20 mg total) by mouth daily. 30 tablet 1  . meloxicam (MOBIC) 7.5 MG tablet Take 1 tablet (7.5 mg total) by mouth daily. 30 tablet 0  . naproxen (NAPROSYN) 500 MG tablet Take 1 tablet (500 mg total) by mouth 2 (two) times daily with a meal. As needed for knee pain. 30 tablet 0   No current facility-administered medications for this visit.     REVIEW OF SYSTEMS:   A 10+ POINT REVIEW OF SYSTEMS WAS OBTAINED including neurology, dermatology, psychiatry, cardiac, respiratory, lymph, extremities, GI, GU, Musculoskeletal, constitutional, breasts, reproductive, HEENT.  All pertinent positives are noted in the HPI.  All others are negative.    PHYSICAL EXAMINATION: ECOG PERFORMANCE STATUS: 1 - Symptomatic but completely ambulatory  Vitals:   11/21/18 1205  BP: 140/78  Pulse: 73  Resp: 17  Temp: 98.5 F (36.9 C)  SpO2: 99%   Filed Weights   11/21/18 1205  Weight: 279 lb 14.4 oz (127 kg)    GENERAL:alert, in no acute distress and comfortable SKIN: no acute rashes, no significant lesions EYES: conjunctiva are pink and non-injected, sclera anicteric OROPHARYNX: MMM, no exudates, no oropharyngeal erythema or ulceration NECK: supple, no JVD LYMPH:  no palpable lymphadenopathy in the cervical, axillary  or inguinal regions LUNGS: clear to auscultation b/l with normal respiratory effort HEART: regular rate & rhythm ABDOMEN:  normoactive bowel sounds , non tender, not distended. Extremity: no pedal edema PSYCH: alert & oriented x 3 with fluent speech NEURO: no focal motor/sensory deficits    LABORATORY DATA:   I have reviewed the data as listed CBC Latest Ref Rng & Units 11/21/2018 09/12/2018 08/15/2018  WBC 4.0 - 10.5 K/uL 6.6 5.6 5.2  Hemoglobin 13.0 - 17.0 g/dL 11.9(L) 11.8(L) 11.8(L)  Hematocrit 39.0 - 52.0 % 35.6(L) 35.5(L) 35.3(L)  Platelets 150 - 400 K/uL 225 246 217   CBC    Component Value Date/Time   WBC 6.6 11/21/2018 1140   RBC 3.81 (L) 11/21/2018 1140   HGB 11.9 (L) 11/21/2018 1140   HCT 35.6 (L) 11/21/2018 1140   PLT 225 11/21/2018 1140   MCV 93.4 11/21/2018 1140   MCV 87.3 12/22/2017 1518   MCH 31.2 11/21/2018 1140   MCHC 33.4 11/21/2018 1140   RDW 13.2 11/21/2018 1140   LYMPHSABS 1.3 11/21/2018 1140   MONOABS 0.5 11/21/2018 1140   EOSABS 0.1 11/21/2018 1140   BASOSABS 0.0 11/21/2018 1140    CMP Latest Ref Rng & Units 11/21/2018 09/12/2018 08/15/2018  Glucose 70 - 99 mg/dL 99 79 87  BUN 8 - 23 mg/dL 15 20 19   Creatinine 0.61 - 1.24 mg/dL 0.93 1.00 1.12  Sodium 135 - 145 mmol/L 143 145 141  Potassium 3.5 - 5.1 mmol/L 3.9 4.1 3.9  Chloride 98 - 111 mmol/L 107 107 108  CO2 22 - 32 mmol/L 26 27 25   Calcium 8.9 - 10.3 mg/dL 8.9 8.9 8.9  Total Protein 6.5 - 8.1 g/dL 6.6 6.7 6.6  Total Bilirubin 0.3 - 1.2 mg/dL 0.9 0.6 0.5  Alkaline Phos 38 - 126 U/L 55 58 62  AST 15 - 41 U/L 17 22 19   ALT 0 - 44 U/L 14  14 14   . Lab Results  Component Value Date   LDH 336 (H) 09/12/2018    01/02/18 Left Inguinal LN Bx:   12/26/17 Tissue Flow Cytometry:   12/26/17 Inguinal Core biopsy:    RADIOGRAPHIC STUDIES: I have personally reviewed the radiological images as listed and agreed with the findings in the report. No results found.  ASSESSMENT & PLAN:   This is a  pleasant 63 y.o. 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  CT of the abdomen and pelvis performed on 12/22/2017 showed bulky left inguinal, left hemipelvic, and retroperitoneal adenopathy.   01/02/18 Left inguinal LN Biopsy revealed T-Cell/histocyte rich Large B-Cell Lymphoma  12/27/17 ECHO revealed LV EF of 55-60%   01/05/18 PET/CT revealedMassively enlarged pelvic lymph nodes intense metabolic activity consistent lymphoma. 2. Additional hypermetabolic lymph nodes in the porta hepatis and retroperitoneum LEFT aorta. 3. Solitary hypermetabolic mediastinal lymph node in the upper LEFT Mediastinum. 4. 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 5. Normal spleen.  HIV non reactive on 12/22/2017.Hep C and hep B serology negative.  03/14/18 PET/CT revealedPET-CT findings suggest an excellent response to chemotherapy. The abdominal lymphadenopathy has near completely resolved and demonstrates a near complete metabolic response. The pelvic and inguinal adenopathy has significantly decreased in size and the metabolic activity has significantly decreased. 2. Diffuse marrow activity likely due to chemotherapy and or marrow stimulating drugs. I do not see any discrete persistent lesions.   04/17/18 CT Head revealedSubtle mesial caudothalamic hypodensities may be artifact though, the could reflect encephalitis or Wernicke's encephalopathy. Consider MRI of the head with and without contrast. 2. Mild chronic small vessel ischemic changes  S/p 6 cycles of EPOCH-R completed on 06/01/18  06/28/18 PET/CT revealed Continued good response to treatment. No residual measurable or  hypermetabolic abdominal lymphadenopathy and no recurrent osseous disease. 2. Interval decrease in size of the left operator region lymph node and also the left inguinal lymph node. However, the both have small foci of slightly increased hypermetabolism which bears surveillance. 3. No new or progressive lymphadenopathy in the neck, chest, abdomen or pelvis.  S/p 39.6 Gy in 22 fractions between 07/25/18 and 08/23/18  2) left lower extremity swelling- now resolved Doppler ultrasound for DVT was negative in the left lower extremity.  Likely from venous compression +/- lymphatic obstruction from bulky left inguinal, left hemipelvic, and retroperitoneal adenopathy.   3) S/p Port a cath placement   4)h/oE.coli UTI - Pansensitive -Resolved.Also appearedto have BPH like symptomatology. -Was hospitalized 04/16/18 to 04/20/18 for E.coli sepsis from UTI, treated with antibiotics.  5)s/pE.COLi sepsis - likely from urinary source. Recent h/o E.coli UTI  6) s/p Symptomatic Anemia Hgb 6.7 - due to chemotherapy/sepsis- s/p PRBC transfusion. No overt evidence of bleeding. hgb now stable at 10.6  7) HTN-was elevated in setting of improved po intake and steroids -improving control -conitnueon lisinopril 20 mg p.o. daily today -Continue Flomax for his BPH. -increasedamlodipine to 10 mgTo optimize BP control -will monitor and optimize rx accordingly.  8) Likely BPH - on flomax  PLAN: -Discussed pt labwork today, 11/21/18; all values are WNL except for RBC at 3.81, hemoglobin 11.9, HCT at 35.6, abs immature granulocytes at 0.26. -Discussed 09/24/2018 right tibia and fibula xray with results revealing no evidence of fracture or other focal bone lesions.  Soft tissues are unremarkable. -no clinical or labs evidence of lymphoma prgression at his time. -Repeat CT scan -Follow up in 3 months with lab work and CT   FOLLOW UP: Labs and CT chest/abd/pelvis in 12 weeks RTC with Dr Irene Limbo in  3 months     All questions were answered. The patient knows to call the clinic with any problems, questions or concerns.  The total time spent in the appt was 15 minutes and more than 50% was on counseling and direct patient cares.    Sullivan Lone MD MS AAHIVMS [facility] Hematology/Oncology Physician [facility]  (Office):       [phone] (Work cell):  [phone] (Fax):           [phone]  I, Jacqualyn Posey, am acting as a scribe for Dr. Sullivan Lone.   .I have reviewed the aove documentation for accuracy and completeness, and I agree with the above. Brunetta Genera MD

## 2018-11-21 ENCOUNTER — Inpatient Hospital Stay (HOSPITAL_BASED_OUTPATIENT_CLINIC_OR_DEPARTMENT_OTHER): Payer: 59 | Admitting: Hematology

## 2018-11-21 ENCOUNTER — Telehealth: Payer: Self-pay | Admitting: Hematology

## 2018-11-21 ENCOUNTER — Inpatient Hospital Stay: Payer: 59 | Attending: Oncology

## 2018-11-21 ENCOUNTER — Inpatient Hospital Stay: Payer: 59

## 2018-11-21 ENCOUNTER — Other Ambulatory Visit: Payer: Self-pay

## 2018-11-21 VITALS — BP 140/78 | HR 73 | Temp 98.5°F | Resp 17 | Ht 72.0 in | Wt 279.9 lb

## 2018-11-21 DIAGNOSIS — C8338 Diffuse large B-cell lymphoma, lymph nodes of multiple sites: Secondary | ICD-10-CM

## 2018-11-21 DIAGNOSIS — Z452 Encounter for adjustment and management of vascular access device: Secondary | ICD-10-CM | POA: Diagnosis present

## 2018-11-21 DIAGNOSIS — I10 Essential (primary) hypertension: Secondary | ICD-10-CM | POA: Insufficient documentation

## 2018-11-21 DIAGNOSIS — Z95828 Presence of other vascular implants and grafts: Secondary | ICD-10-CM

## 2018-11-21 DIAGNOSIS — C8330 Diffuse large B-cell lymphoma, unspecified site: Secondary | ICD-10-CM

## 2018-11-21 DIAGNOSIS — Z79899 Other long term (current) drug therapy: Secondary | ICD-10-CM

## 2018-11-21 DIAGNOSIS — F17210 Nicotine dependence, cigarettes, uncomplicated: Secondary | ICD-10-CM

## 2018-11-21 DIAGNOSIS — Z7982 Long term (current) use of aspirin: Secondary | ICD-10-CM | POA: Diagnosis not present

## 2018-11-21 LAB — CBC WITH DIFFERENTIAL/PLATELET
Abs Immature Granulocytes: 0.26 10*3/uL — ABNORMAL HIGH (ref 0.00–0.07)
Basophils Absolute: 0 10*3/uL (ref 0.0–0.1)
Basophils Relative: 1 %
Eosinophils Absolute: 0.1 10*3/uL (ref 0.0–0.5)
Eosinophils Relative: 2 %
HCT: 35.6 % — ABNORMAL LOW (ref 39.0–52.0)
Hemoglobin: 11.9 g/dL — ABNORMAL LOW (ref 13.0–17.0)
Immature Granulocytes: 4 %
Lymphocytes Relative: 20 %
Lymphs Abs: 1.3 10*3/uL (ref 0.7–4.0)
MCH: 31.2 pg (ref 26.0–34.0)
MCHC: 33.4 g/dL (ref 30.0–36.0)
MCV: 93.4 fL (ref 80.0–100.0)
Monocytes Absolute: 0.5 10*3/uL (ref 0.1–1.0)
Monocytes Relative: 8 %
Neutro Abs: 4.4 10*3/uL (ref 1.7–7.7)
Neutrophils Relative %: 65 %
Platelets: 225 10*3/uL (ref 150–400)
RBC: 3.81 MIL/uL — ABNORMAL LOW (ref 4.22–5.81)
RDW: 13.2 % (ref 11.5–15.5)
WBC: 6.6 10*3/uL (ref 4.0–10.5)
nRBC: 0 % (ref 0.0–0.2)

## 2018-11-21 LAB — CMP (CANCER CENTER ONLY)
ALT: 14 U/L (ref 0–44)
AST: 17 U/L (ref 15–41)
Albumin: 3.9 g/dL (ref 3.5–5.0)
Alkaline Phosphatase: 55 U/L (ref 38–126)
Anion gap: 10 (ref 5–15)
BUN: 15 mg/dL (ref 8–23)
CO2: 26 mmol/L (ref 22–32)
Calcium: 8.9 mg/dL (ref 8.9–10.3)
Chloride: 107 mmol/L (ref 98–111)
Creatinine: 0.93 mg/dL (ref 0.61–1.24)
GFR, Est AFR Am: >60 mL/min (ref >60)
GFR, Estimated: >60 mL/min (ref >60)
Glucose, Bld: 99 mg/dL (ref 70–99)
Potassium: 3.9 mmol/L (ref 3.5–5.1)
Sodium: 143 mmol/L (ref 135–145)
Total Bilirubin: 0.9 mg/dL (ref 0.3–1.2)
Total Protein: 6.6 g/dL (ref 6.5–8.1)

## 2018-11-21 LAB — LACTATE DEHYDROGENASE: LDH: 318 U/L — ABNORMAL HIGH (ref 98–192)

## 2018-11-21 MED ORDER — SODIUM CHLORIDE 0.9% FLUSH
10.0000 mL | INTRAVENOUS | Status: DC | PRN
  Administered 2018-11-21: 10 mL via INTRAVENOUS
  Filled 2018-11-21: qty 10

## 2018-11-21 MED ORDER — HEPARIN SOD (PORK) LOCK FLUSH 100 UNIT/ML IV SOLN
500.0000 [IU] | Freq: Once | INTRAVENOUS | Status: AC
  Administered 2018-11-21: 500 [IU] via INTRAVENOUS
  Filled 2018-11-21: qty 5

## 2018-11-21 NOTE — Telephone Encounter (Signed)
Scheduled appt per 7/15 los.  Printed and mailed appt calendar.

## 2018-11-21 NOTE — Patient Instructions (Signed)

## 2018-11-26 ENCOUNTER — Encounter: Payer: Self-pay | Admitting: Hematology

## 2018-11-27 ENCOUNTER — Telehealth: Payer: Self-pay | Admitting: *Deleted

## 2018-11-27 NOTE — Telephone Encounter (Signed)
Opened in error

## 2018-12-14 ENCOUNTER — Other Ambulatory Visit: Payer: Self-pay

## 2018-12-14 ENCOUNTER — Encounter: Payer: Self-pay | Admitting: Gastroenterology

## 2018-12-14 ENCOUNTER — Ambulatory Visit (INDEPENDENT_AMBULATORY_CARE_PROVIDER_SITE_OTHER): Payer: 59 | Admitting: Family Medicine

## 2018-12-14 ENCOUNTER — Encounter: Payer: Self-pay | Admitting: Family Medicine

## 2018-12-14 VITALS — BP 140/78 | HR 85 | Temp 98.2°F | Resp 16 | Wt 285.8 lb

## 2018-12-14 DIAGNOSIS — Z1211 Encounter for screening for malignant neoplasm of colon: Secondary | ICD-10-CM

## 2018-12-14 DIAGNOSIS — W57XXXA Bitten or stung by nonvenomous insect and other nonvenomous arthropods, initial encounter: Secondary | ICD-10-CM | POA: Diagnosis not present

## 2018-12-14 DIAGNOSIS — Z131 Encounter for screening for diabetes mellitus: Secondary | ICD-10-CM

## 2018-12-14 DIAGNOSIS — Z125 Encounter for screening for malignant neoplasm of prostate: Secondary | ICD-10-CM

## 2018-12-14 DIAGNOSIS — Z13220 Encounter for screening for lipoid disorders: Secondary | ICD-10-CM

## 2018-12-14 DIAGNOSIS — I10 Essential (primary) hypertension: Secondary | ICD-10-CM

## 2018-12-14 DIAGNOSIS — Z0000 Encounter for general adult medical examination without abnormal findings: Secondary | ICD-10-CM | POA: Diagnosis not present

## 2018-12-14 DIAGNOSIS — S40861A Insect bite (nonvenomous) of right upper arm, initial encounter: Secondary | ICD-10-CM

## 2018-12-14 DIAGNOSIS — Z23 Encounter for immunization: Secondary | ICD-10-CM

## 2018-12-14 DIAGNOSIS — Z6838 Body mass index (BMI) 38.0-38.9, adult: Secondary | ICD-10-CM

## 2018-12-14 MED ORDER — LISINOPRIL 20 MG PO TABS: 20.0000 mg | ORAL_TABLET | Freq: Every day | ORAL | 1 refills | Status: DC

## 2018-12-14 MED ORDER — AMLODIPINE BESYLATE 10 MG PO TABS: 10.0000 mg | ORAL_TABLET | Freq: Every day | ORAL | 1 refills | Status: DC

## 2018-12-14 MED ORDER — SHINGRIX 50 MCG/0.5ML IM SUSR: 0.5000 mL | Freq: Once | INTRAMUSCULAR | 1 refills | Status: AC

## 2018-12-14 NOTE — Progress Notes (Signed)
Subjective:    Patient ID: Jesus [name], male    DOB: 1956-04-27, 63 y.o.   MRN: [id number]  HPI Jesus [name] is a 63 y.o. male Presents today for: Chief Complaint  Patient presents with  . Annual Exam    patient is doing well. Patient have one issus he would like to talk about along with his physical (bugBite)  . Insect Bite    Only issus patient would like to talk about today is a bug bite under right arm 2 days   Presents for annual physical exam and concern about a bug bite. History of hypertension, RLS, large cell non-Hodgkin's lymphoma  Orthopedist, Dr. Erlinda Hong, injection to the right knee for chronic pain/osteoarthritis in June.  Large cell non-Hodgkin's lymphoma: Oncologist Dr. Irene Limbo, appointment July 15.   Status post 6 cycles of EPOCH-R.  PET/CT February 2020 revealed continued good response to treatment without measurable hypermetabolic abdominal lymphadenopathy, no recurrent osseous disease, decrease in size of left obturator  regional lymph node and left inguinal lymph node.  Both still had slight increased hypermetabolism with planned surveillance.  No new or progressive lymphadenopathy in the neck chest abdomen or pelvis.   Lower extremity swelling was improved, thought to be from venous compression plus or minus lymphatic obstruction from the lymphadenopathy.   Hemoglobin was stable at 10.6, previous anemia at 6.7 status post transfusion from chemotherapy/sepsis.   Hypertension was also improving, likely component of steroids and increase p.o. intake.   Was continued on Flomax for BPH symptoms amlodipine increased to 10 mg to optimize blood pressure control.  Plan for 55-monthfollow-up.  Bump under R axilla/arm Noted 2 days ago.  Felt like bit when cutting grass.  Whelp in that area. Same size.  No fever.  Some redness around area. Itchy. No pain.   Tx: steroid cream once.   Hypertension: BP Readings from Last 3 Encounters:  12/14/18 140/78  11/21/18 140/78   09/21/18 (!) 149/82   Lab Results  Component Value Date   CREATININE 0.93 11/21/2018  Amlodipine 10 mg daily, lisinopril 20 mg daily No new side effects.   Lipid screen: No results found for: CHOL, HDL, LDLCALC, LDLDIRECT, TRIG, CHOLHDL  Diabetes screen: Glucose 99 on 7/15.   CAncer screening:  Colon: Due for colonoscopy.12 years ago. Brother died with colon CA 3 weeks ago. No blood in stool or stool changes.  Prostate: On treatment for BPH with Flomax. No recent PSA. Agrees to testing.   Immunization History  Administered Date(s) Administered  . Influenza,inj,Quad PF,6+ Mos 03/05/2018  . Influenza-Unspecified 04/18/2016  . Tdap 12/14/2018  shingles - had outbreak about 9 yrs ago. Requests vaccine.   Depression screen [facility]2/9 12/14/2018 10/03/2018 09/21/2018 12/22/2017 05/30/2016  Decreased Interest 0 0 0 0 0  Down, Depressed, Hopeless 0 0 0 0 0  PHQ - 2 Score 0 0 0 0 0    Hearing Screening   125Hz  250Hz  500Hz  1000Hz  2000Hz  3000Hz  4000Hz  6000Hz  8000Hz   Right ear:           Left ear:             Visual Acuity Screening   Right eye Left eye Both eyes  Without correction:     With correction: 20/50 20/40 20/40    Dental: ongoing care - checkup last week.   Exercise/obesity.  Body mass index is 38.76 kg/m.  Plan for PT, no regular exercise beside yadwork at home.     Patient Active Problem List  Diagnosis Date Noted  . Chronic pain of right knee 10/17/2018  . Large cell (diffuse) non-Hodgkin's lymphoma ([place]) 05/10/2018  . Bacteremia due to Escherichia coli   . HTN (hypertension) 04/17/2018  . RLS (restless legs syndrome) 04/17/2018  . Abnormal LFTs 04/17/2018  . Acute metabolic encephalopathy 67/89/3810  . AKI (acute kidney injury) ([place]) 04/16/2018  . Dehydration   . Fever, unspecified   . Sepsis ([place])   . Disorientation   . Hypokalemia   . Hypomagnesemia   . Anemia   . Encounter for antineoplastic chemotherapy   . At high risk of tumor lysis syndrome   .  Swelling of lower leg   . Diffuse large B cell lymphoma ([place]) 01/15/2018  . 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[place]) 01/12/2018  . Counseling regarding advance care planning and goals of care 01/12/2018  . Bilateral leg pain 05/27/2014   Past Medical History:  Diagnosis Date  . Allergy   . History of kidney stones   . Hyperlipidemia   . Hypertension   . Lymphadenopathy   . Pain, lower leg    Bilateral  . Peripheral arterial disease ([place])   . Pre-diabetes   . Red-green color blindness   . Snores   . Wears glasses    Past Surgical History:  Procedure Laterality Date  . CATARACT EXTRACTION W/ INTRAOCULAR LENS  IMPLANT, BILATERAL    . COLONOSCOPY    . dislodged salava stone    . FRACTURE SURGERY    . HAND ARTHROPLASTY  1995   crushed left hand  . INGUINAL LYMPH NODE BIOPSY Left 01/02/2018   Procedure: LEFT INGUINAL LYMPH NODE BIOPSY;  Surgeon: Rolm Bookbinder, MD;  Location: [facility];  Service: General;  Laterality: Left;  . IR IMAGING GUIDED PORT INSERTION  01/15/2018  . MICROLARYNGOSCOPY Left 01/17/2014   Procedure: MICROLARYNGOSCOPY WITH EXCISION OF THE BIOPSY OF LEFT VOCAL CORD LESION;  Surgeon: Izora Gala, MD;  Location: [facility];  Service: ENT;  Laterality: Left;  . ORIF FOOT FRACTURE  2005   left   Allergies  Allergen Reactions  . Bee Venom Anaphylaxis   Prior to Admission medications   Medication Sig Start Date End Date Taking? Authorizing Provider  amLODipine (NORVASC) 10 MG tablet Take 1 tablet (10 mg total) by mouth daily. 05/15/18  Yes Brunetta Genera, MD  aspirin 81 MG tablet Take 81 mg by mouth daily.   Yes [provider]  B-COMPLEX-C PO Take by mouth.   Yes [provider]  lisinopril (PRINIVIL,ZESTRIL) 20 MG tablet Take 1 tablet (20 mg total) by mouth daily. 05/14/18  Yes Brunetta Genera, MD  naproxen (NAPROSYN) 500 MG tablet Take 1 tablet (500 mg total) by mouth 2 (two) times daily with a  meal. As needed for knee pain. 10/17/18  Yes Wendie Agreste, MD   Social History   Socioeconomic History  . Marital status: Divorced    Spouse name: Not on file  . Number of children: 3  . Years of education: Not on file  . Highest education level: Not on file  Occupational History  . Not on file  Social Needs  . Financial resource strain: Not on file  . Food insecurity    Worry: Not on file    Inability: Not on file  . Transportation needs    Medical: No    Non-medical: No  Tobacco Use  . Smoking status: Current Every Day Smoker  Packs/day: 0.50    Years: 36.00    Pack years: 18.00    Types: Cigarettes  . Smokeless tobacco: Never Used  . Tobacco comment: he denies smoking in about 2 weeks 07/13/18  Substance and Sexual Activity  . Alcohol use: Not Currently    Alcohol/week: 15.0 standard drinks    Types: 10 Cans of beer, 5 Shots of liquor per week    Comment: weekends, he denies current alcohol use 07/13/18  . Drug use: Not Currently    Types: Cocaine    Comment: reports cocaine usage ~2X/ month; last use 12/26/17  . Sexual activity: Not on file  Lifestyle  . Physical activity    Days per week: Not on file    Minutes per session: Not on file  . Stress: Not on file  Relationships  . Social Herbalist on phone: Not on file    Gets together: Not on file    Attends religious service: Not on file    Active member of club or organization: Not on file    Attends meetings of clubs or organizations: Not on file    Relationship status: Not on file  . Intimate partner violence    Fear of current or ex partner: No    Emotionally abused: No    Physically abused: No    Forced sexual activity: No  Other Topics Concern  . Not on file  Social History Narrative  . Not on file    Review of Systems  Constitutional: Negative for fatigue and unexpected weight change.  Eyes: Negative for visual disturbance.  Respiratory: Negative for cough, chest tightness and  shortness of breath.   Cardiovascular: Negative for chest pain, palpitations and leg swelling.  Gastrointestinal: Negative for abdominal pain and blood in stool.  Neurological: Negative for dizziness, light-headedness and headaches.  13 point review of systems per patient health survey noted.  Negative other than as indicated above or in HPI.       Objective:   Physical Exam Vitals signs reviewed.  Constitutional:      Appearance: He is well-developed.  HENT:     Head: Normocephalic and atraumatic.     Right Ear: External ear normal.     Left Ear: External ear normal.  Eyes:     Conjunctiva/sclera: Conjunctivae normal.     Pupils: Pupils are equal, round, and reactive to light.  Neck:     Musculoskeletal: Normal range of motion and neck supple.     Thyroid: No thyromegaly.  Cardiovascular:     Rate and Rhythm: Normal rate and regular rhythm.     Heart sounds: Normal heart sounds.  Pulmonary:     Effort: Pulmonary effort is normal. No respiratory distress.     Breath sounds: Normal breath sounds. No wheezing.  Abdominal:     General: There is no distension.     Palpations: Abdomen is soft.     Tenderness: There is no abdominal tenderness.     Hernia: There is no hernia in the left inguinal area or right inguinal area.  Genitourinary:    Prostate: No nodules present (Some difficulty palpating the entire prostate, with body habitus, but no apparent nodule on palpated portion. ).  Musculoskeletal: Normal range of motion.        General: No tenderness.  Lymphadenopathy:     Cervical: No cervical adenopathy.  Skin:    General: Skin is warm and dry.       Neurological:  Mental Status: He is alert and oriented to person, place, and time.     Deep Tendon Reflexes: Reflexes are normal and symmetric.  Psychiatric:        Behavior: Behavior normal.    Vitals:   12/14/18 0929  BP: 140/78  Pulse: 85  Resp: 16  Temp: 98.2 F (36.8 C)  TempSrc: Oral  SpO2: 97%  Weight:  285 lb 12.8 oz (129.6 kg)        Assessment & Plan:   Jesus [name] is a 63 y.o. male Annual physical exam  - -anticipatory guidance as below in AVS, screening labs above. Health maintenance items as above in HPI discussed/recommended as applicable.   Need for prophylactic vaccination with combined diphtheria-tetanus-pertussis (DTP) vaccine - Plan: Tdap vaccine greater than or equal to 7yo IM   Special screening for malignant neoplasms, colon - Plan: Ambulatory referral to Gastroenterology  Screening for hyperlipidemia - Plan: Lipid panel  Screening for prostate cancer - Plan: PSA  - We discussed pros and cons of prostate cancer screening, and after this discussion, he chose to have screening done. PSA obtained, and no concerning findings on DRE, but limited exam with body habitus.  Need for shingles vaccine - Plan: Zoster Vaccine Adjuvanted [facility]) injection sent to pharmacy  BMI 38.0-38.9,adult - Plan: Hemoglobin A1c Screening for diabetes mellitus - Plan: Hemoglobin A1c  -Diet/exercise discussed weight loss, screen for diabetes.  Nonvenomous insect bite of axilla, right, initial encounter  -Insect bite versus early hidradenitis/folliculitis.  -Warm compresses, cortisone cream for itching, RTC precautions if worsening/persistent  Essential hypertension - Plan: amLODipine (NORVASC) 10 MG tablet  -Stable, continue Norvasc same dose.  Meds ordered this encounter  Medications  . Zoster Vaccine Adjuvanted [facility]) injection    Sig: Inject 0.5 mLs into the muscle once for 1 dose. Repeat in 2-6 months.    Dispense:  0.5 mL    Refill:  1  . amLODipine (NORVASC) 10 MG tablet    Sig: Take 1 tablet (10 mg total) by mouth daily.    Dispense:  90 tablet    Refill:  1  . lisinopril (ZESTRIL) 20 MG tablet    Sig: Take 1 tablet (20 mg total) by mouth daily.    Dispense:  90 tablet    Refill:  1   Patient Instructions     Bumps on right arm.  Could be insect bite or less  likely blocked oil gland versus early infection.  It does not appear to be infected at this time, but would recommend warm compresses few times per day.  Okay to use cortisone cream for itching, but if any increase in size or worsening in the next few days, return for recheck.  No med changes for now.  I will check some lab work as we discussed.    I will refer you to gastroenterology, and shingles vaccine was sent to your pharmacy.  Return to the clinic or go to the nearest emergency room if any of your symptoms worsen or new symptoms occur.   Keeping you healthy  Get these tests  Blood pressure- Have your blood pressure checked once a year by your healthcare provider.  Normal blood pressure is 120/80  Weight- Have your body mass index (BMI) calculated to screen for obesity.  BMI is a measure of body fat based on height and weight. You can also calculate your own BMI at [email or web address].  Cholesterol- Have your cholesterol checked every year.  Diabetes- Have your blood  sugar checked regularly if you have high blood pressure, high cholesterol, have a family history of diabetes or if you are overweight.  Screening for Colon Cancer- Colonoscopy starting at age 22.  Screening may begin sooner depending on your family history and other health conditions. Follow up colonoscopy as directed by your Gastroenterologist.  Screening for Prostate Cancer- Both blood work (PSA) and a rectal exam help screen for Prostate Cancer.  Screening begins at age 21 with African-American men and at age 61 with Caucasian men.  Screening may begin sooner depending on your family history.  Take these medicines  Aspirin- One aspirin daily can help prevent Heart disease and Stroke.  Flu shot- Every fall.  Tetanus- Every 10 years.  Zostavax- Once after the age of 75 to prevent Shingles.  Pneumonia shot- Once after the age of 1; if you are younger than 55, ask your healthcare provider if you need a  Pneumonia shot.  Take these steps  Don't smoke- If you do smoke, talk to your doctor about quitting.  For tips on how to quit, go to www.smokefree.gov or call 1-800-QUIT-NOW.  Be physically active- Exercise 5 days a week for at least 30 minutes.  If you are not already physically active start slow and gradually work up to 30 minutes of moderate physical activity.  Examples of moderate activity include walking briskly, mowing the yard, dancing, swimming, bicycling, etc.  Eat a healthy diet- Eat a variety of healthy food such as fruits, vegetables, low fat milk, low fat cheese, yogurt, lean meant, poultry, fish, beans, tofu, etc. For more information go to www.thenutritionsource.org  Drink alcohol in moderation- Limit alcohol intake to less than two drinks a day. Never drink and drive.  Dentist- Brush and floss twice daily; visit your dentist twice a year.  Depression- Your emotional health is as important as your physical health. If you're feeling down, or losing interest in things you would normally enjoy please talk to your healthcare provider.  Eye exam- Visit your eye doctor every year.  Safe sex- If you may be exposed to a sexually transmitted infection, use a condom.  Seat belts- Seat belts can save your life; always wear one.  Smoke/Carbon Monoxide detectors- These detectors need to be installed on the appropriate level of your home.  Replace batteries at least once a year.  Skin cancer- When out in the sun, cover up and use sunscreen 15 SPF or higher.  Violence- If anyone is threatening you, please tell your healthcare provider.  Living Will/ Health care power of attorney- Speak with your healthcare provider and family.  If you have lab work done today you will be contacted with your lab results within the next 2 weeks.  If you have not heard from [place] then please contact us. The fastest way to get your results is to register for My Chart.   IF you received an x-ray today, you  will receive an invoice from [facility]. Please contact [facility] at [phone] with questions or concerns regarding your invoice.   IF you received labwork today, you will receive an invoice from [facility]. Please contact [facility] at [phone] with questions or concerns regarding your invoice.   Our billing staff will not be able to assist you with questions regarding bills from these companies.  You will be contacted with the lab results as soon as they are available. The fastest way to get your results is to activate your My Chart account. Instructions are located on the  last page of this paperwork. If you have not heard from [place] regarding the results in 2 weeks, please contact this office.       Signed,   Merri Ray, MD [facility].  12/15/18 11:30 AM

## 2018-12-14 NOTE — Patient Instructions (Addendum)
Bumps on right arm.  Could be insect bite or less likely blocked oil gland versus early infection.  It does not appear to be infected at this time, but would recommend warm compresses few times per day.  Okay to use cortisone cream for itching, but if any increase in size or worsening in the next few days, return for recheck.  No med changes for now.  I will check some lab work as we discussed.    I will refer you to gastroenterology, and shingles vaccine was sent to your pharmacy.  Return to the clinic or go to the nearest emergency room if any of your symptoms worsen or new symptoms occur.   Keeping you healthy  Get these tests  Blood pressure- Have your blood pressure checked once a year by your healthcare provider.  Normal blood pressure is 120/80  Weight- Have your body mass index (BMI) calculated to screen for obesity.  BMI is a measure of body fat based on height and weight. You can also calculate your own BMI at [employer]Bank[employer][email or web address].  Cholesterol- Have your cholesterol checked every year.  Diabetes- Have your blood sugar checked regularly if you have high blood pressure, high cholesterol, have a family history of diabetes or if you are overweight.  Screening for Colon Cancer- Colonoscopy starting at age 43.  Screening may begin sooner depending on your family history and other health conditions. Follow up colonoscopy as directed by your Gastroenterologist.  Screening for Prostate Cancer- Both blood work (PSA) and a rectal exam help screen for Prostate Cancer.  Screening begins at age 73 with African-American men and at age 47 with Caucasian men.  Screening may begin sooner depending on your family history.  Take these medicines  Aspirin- One aspirin daily can help prevent Heart disease and Stroke.  Flu shot- Every fall.  Tetanus- Every 10 years.  Zostavax- Once after the age of 52 to prevent Shingles.  Pneumonia shot- Once after the age of 15; if you are younger  than 31, ask your healthcare provider if you need a Pneumonia shot.  Take these steps  Don't smoke- If you do smoke, talk to your doctor about quitting.  For tips on how to quit, go to www.smokefree.gov or call 1-800-QUIT-NOW.  Be physically active- Exercise 5 days a week for at least 30 minutes.  If you are not already physically active start slow and gradually work up to 30 minutes of moderate physical activity.  Examples of moderate activity include walking briskly, mowing the yard, dancing, swimming, bicycling, etc.  Eat a healthy diet- Eat a variety of healthy food such as fruits, vegetables, low fat milk, low fat cheese, yogurt, lean meant, poultry, fish, beans, tofu, etc. For more information go to www.thenutritionsource.org  Drink alcohol in moderation- Limit alcohol intake to less than two drinks a day. Never drink and drive.  Dentist- Brush and floss twice daily; visit your dentist twice a year.  Depression- Your emotional health is as important as your physical health. If you're feeling down, or losing interest in things you would normally enjoy please talk to your healthcare provider.  Eye exam- Visit your eye doctor every year.  Safe sex- If you may be exposed to a sexually transmitted infection, use a condom.  Seat belts- Seat belts can save your life; always wear one.  Smoke/Carbon Monoxide detectors- These detectors need to be installed on the appropriate level of your home.  Replace batteries at least once a year.  Skin cancer-  When out in the sun, cover up and use sunscreen 15 SPF or higher.  Violence- If anyone is threatening you, please tell your healthcare provider.  Living Will/ Health care power of attorney- Speak with your healthcare provider and family.  If you have lab work done today you will be contacted with your lab results within the next 2 weeks.  If you have not heard from [place] then please contact us. The fastest way to get your results is to register for  My Chart.   IF you received an x-ray today, you will receive an invoice from [facility]. Please contact [facility] at [phone] with questions or concerns regarding your invoice.   IF you received labwork today, you will receive an invoice from [place] [facility]. Please contact [facility] at [phone] with questions or concerns regarding your invoice.   Our billing staff will not be able to assist you with questions regarding bills from these companies.  You will be contacted with the lab results as soon as they are available. The fastest way to get your results is to activate your My Chart account. Instructions are located on the last page of this paperwork. If you have not heard from [place] regarding the results in 2 weeks, please contact this office.

## 2018-12-15 ENCOUNTER — Encounter: Payer: Self-pay | Admitting: Family Medicine

## 2018-12-15 LAB — HEMOGLOBIN A1C
Est. average glucose Bld gHb Est-mCnc: 105 mg/dL
Hgb A1c MFr Bld: 5.3 % (ref 4.8–5.6)

## 2018-12-15 LAB — LIPID PANEL
Chol/HDL Ratio: 3.2 ratio (ref 0.0–5.0)
Cholesterol, Total: 112 mg/dL (ref 100–199)
HDL: 35 mg/dL — ABNORMAL LOW (ref >39)
LDL Calculated: 38 mg/dL (ref 0–99)
Triglycerides: 195 mg/dL — ABNORMAL HIGH (ref 0–149)
VLDL Cholesterol Cal: 39 mg/dL (ref 5–40)

## 2018-12-15 LAB — PSA: Prostate Specific Ag, Serum: 0.6 ng/mL (ref 0.0–4.0)

## 2019-01-06 ENCOUNTER — Encounter: Payer: Self-pay | Admitting: Hematology

## 2019-01-07 ENCOUNTER — Telehealth: Payer: Self-pay | Admitting: *Deleted

## 2019-01-07 NOTE — Telephone Encounter (Signed)
Attempted to contact patient regarding message sent via MyChart requesting records to determine which records he wants. Left voice mail and asked patient to call office.

## 2019-01-08 ENCOUNTER — Other Ambulatory Visit: Payer: Self-pay

## 2019-01-08 ENCOUNTER — Telehealth: Payer: Self-pay | Admitting: *Deleted

## 2019-01-08 ENCOUNTER — Ambulatory Visit (AMBULATORY_SURGERY_CENTER): Payer: Self-pay | Admitting: *Deleted

## 2019-01-08 VITALS — Temp 97.3°F | Ht 72.0 in | Wt 286.0 lb

## 2019-01-08 DIAGNOSIS — Z1211 Encounter for screening for malignant neoplasm of colon: Secondary | ICD-10-CM

## 2019-01-08 MED ORDER — SUPREP BOWEL PREP KIT 17.5-3.13-1.6 GM/177ML PO SOLN: 1.0000 | Freq: Once | ORAL | 0 refills | Status: AC

## 2019-01-08 NOTE — Progress Notes (Signed)
No egg or soy allergy known to patient  No issues with past sedation with any surgeries  or procedures, no intubation problems  No diet pills per patient No home 02 use per patient  No blood thinners per patient  Pt denies issues with constipation  No A fib or A flutter  EMMI video sent to pt's e mail   Suprep $15 coupon to pt in [place] today

## 2019-01-08 NOTE — Telephone Encounter (Signed)
Yes okay to proceed at the [facility] if he's otherwise stable. Thanks

## 2019-01-08 NOTE — Telephone Encounter (Signed)
Dr Havery Moros,  I saw this pt in [place] today - he has  A history of non hodgkin's lymphoma diagnosed 2019- - last chemo 06-01-2018 and last radiation 06-2018- last oncology note 11-2018 states colon okay 6 months past last chemo- which he is. I just wanted to make sure it's okay with you to proceed as scheduled, his colon is 9-21 Monday   Please advise, Thanks Lelan Pons

## 2019-01-09 ENCOUNTER — Telehealth: Payer: Self-pay | Admitting: Hematology

## 2019-01-09 NOTE — Telephone Encounter (Signed)
Printed medical records for patient to pickup for this year 2020. Called pt and let him know these will be at front desk reception area.

## 2019-01-10 ENCOUNTER — Encounter: Payer: 59 | Admitting: Physical Therapy

## 2019-01-10 ENCOUNTER — Ambulatory Visit: Payer: 59 | Attending: Hematology | Admitting: Physical Therapy

## 2019-01-10 ENCOUNTER — Telehealth: Payer: Self-pay | Admitting: *Deleted

## 2019-01-10 ENCOUNTER — Other Ambulatory Visit: Payer: Self-pay

## 2019-01-10 ENCOUNTER — Other Ambulatory Visit: Payer: Self-pay | Admitting: Hematology

## 2019-01-10 ENCOUNTER — Encounter: Payer: Self-pay | Admitting: Physical Therapy

## 2019-01-10 ENCOUNTER — Ambulatory Visit: Payer: 59 | Admitting: Physical Therapy

## 2019-01-10 DIAGNOSIS — R293 Abnormal posture: Secondary | ICD-10-CM | POA: Insufficient documentation

## 2019-01-10 DIAGNOSIS — M6281 Muscle weakness (generalized): Secondary | ICD-10-CM | POA: Insufficient documentation

## 2019-01-10 DIAGNOSIS — R2681 Unsteadiness on feet: Secondary | ICD-10-CM | POA: Diagnosis present

## 2019-01-10 DIAGNOSIS — R262 Difficulty in walking, not elsewhere classified: Secondary | ICD-10-CM | POA: Diagnosis present

## 2019-01-10 DIAGNOSIS — C8330 Diffuse large B-cell lymphoma, unspecified site: Secondary | ICD-10-CM
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
## 2019-01-15 ENCOUNTER — Encounter: Payer: Self-pay | Admitting: Gastroenterology

## 2019-01-18 ENCOUNTER — Encounter

## 2019-01-24 ENCOUNTER — Other Ambulatory Visit: Payer: Self-pay

## 2019-01-24 ENCOUNTER — Encounter: Payer: Self-pay | Admitting: Physical Therapy

## 2019-01-24 ENCOUNTER — Ambulatory Visit: Payer: 59 | Admitting: Physical Therapy

## 2019-01-24 DIAGNOSIS — R2681 Unsteadiness on feet: Secondary | ICD-10-CM

## 2019-01-24 DIAGNOSIS — M6281 Muscle weakness (generalized): Secondary | ICD-10-CM

## 2019-01-24 DIAGNOSIS — R262 Difficulty in walking, not elsewhere classified: Secondary | ICD-10-CM

## 2019-01-24 DIAGNOSIS — R293 Abnormal posture: Secondary | ICD-10-CM
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
## 2019-01-24 NOTE — Patient Instructions (Signed)
Access Code: [email or web address]  URL: [email or web address]  Date: 01/24/2019  Prepared by: Maudry Diego   Exercises Supine Bridge - 10 reps - 3 sets - 1x daily - 7x weekly Supine Active Straight Leg Raise - 10 reps - 3 sets - 1x daily - 7x weekly Clamshell - 10 reps - 3 sets - 1x daily - 7x weekly Sidelying Hip Abduction - 10 reps - 3 sets - 1x daily - 7x weekly

## 2019-01-25 ENCOUNTER — Telehealth: Payer: Self-pay | Admitting: Gastroenterology

## 2019-01-25 ENCOUNTER — Encounter: Payer: 59 | Admitting: Physical Therapy

## 2019-01-25 NOTE — Telephone Encounter (Signed)
Do you now or have you had a fever in the last 14 days?    No     Do you have any respiratory symptoms of shortness of breath or cough now or in the last 14 days?   No     Do you have any family members or close contacts with diagnosed or suspected Covid-19 in the past 14 days?    No     Have you been tested for Covid-19 and found to be positive?   No     Pt made aware to check in on the 4th floor and that care partner may wait in the car or come up to the lobby during the procedure but will need to provide their own mask.

## 2019-01-28 ENCOUNTER — Other Ambulatory Visit: Payer: Self-pay | Admitting: Gastroenterology

## 2019-01-28 ENCOUNTER — Other Ambulatory Visit: Payer: Self-pay

## 2019-01-28 ENCOUNTER — Ambulatory Visit (AMBULATORY_SURGERY_CENTER): Payer: 59 | Admitting: Gastroenterology

## 2019-01-28 ENCOUNTER — Encounter: Payer: Self-pay | Admitting: Gastroenterology

## 2019-01-28 VITALS — BP 129/83 | HR 61 | Temp 97.5°F | Resp 12 | Ht 72.0 in | Wt 286.0 lb

## 2019-01-28 DIAGNOSIS — Z800 Family history of malignant neoplasm of digestive organs: Secondary | ICD-10-CM

## 2019-01-28 DIAGNOSIS — Z1211 Encounter for screening for malignant neoplasm of colon: Secondary | ICD-10-CM | POA: Diagnosis present

## 2019-01-28 DIAGNOSIS — D122 Benign neoplasm of ascending colon: Secondary | ICD-10-CM | POA: Diagnosis not present

## 2019-01-28 MED ORDER — SODIUM CHLORIDE 0.9 % IV SOLN: 500.0000 mL | Freq: Once | INTRAVENOUS | Status: DC

## 2019-01-28 NOTE — Progress Notes (Signed)
Called to room to assist during endoscopic procedure.  Patient ID and intended procedure confirmed with present staff. Received instructions for my participation in the procedure from the performing physician.  

## 2019-01-28 NOTE — Op Note (Signed)
 [facility] Patient Name: Jesus [name] Procedure Date: 01/28/2019 11:23 AM MRN: [id number] Endoscopist: Remo Lipps P. Havery Moros , MD Age: 63 Referring MD:  Date of Birth: 1955/10/26 Gender: Male Account #: [id number] Procedure:                Colonoscopy Indications:              Screening for colorectal malignant neoplasm,                            brother recently diagnosed with colon cancer age                            around age 81-70, last exam > 10 years ago normal                            per patient Medicines:                Monitored Anesthesia Care Procedure:                Pre-Anesthesia Assessment:                           - Prior to the procedure, a History and Physical                            was performed, and patient medications and                            allergies were reviewed. The patient's tolerance of                            previous anesthesia was also reviewed. The risks                            and benefits of the procedure and the sedation                            options and risks were discussed with the patient.                            All questions were answered, and informed consent                            was obtained. Prior Anticoagulants: The patient has                            taken no previous anticoagulant or antiplatelet                            agents. ASA Grade Assessment: II - A patient with                            mild systemic disease. After reviewing the risks  and benefits, the patient was deemed in                            satisfactory condition to undergo the procedure.                           After obtaining informed consent, the colonoscope                            was passed under direct vision. Throughout the                            procedure, the patient's blood pressure, pulse, and                            oxygen saturations were monitored continuously. The                             Colonoscope was introduced through the anus and                            advanced to the the cecum, identified by                            appendiceal orifice and ileocecal valve. The                            colonoscopy was performed without difficulty. The                            patient tolerated the procedure well. The quality                            of the bowel preparation was adequate. The                            ileocecal valve, appendiceal orifice, and rectum                            were photographed. Scope In: 11:30:13 AM Scope Out: 11:46:21 AM Scope Withdrawal Time: 0 hours 12 minutes 22 seconds  Total Procedure Duration: 0 hours 16 minutes 8 seconds  Findings:                 The perianal and digital rectal examinations were                            normal.                           A 4 mm polyp was found in the ascending colon. The                            polyp was sessile. The polyp was removed with a  cold snare. Resection and retrieval were complete.                           A few small-mouthed diverticula were found in the                            ascending colon.                           Internal hemorrhoids were found during                            retroflexion. The hemorrhoids were moderate.                           The exam was otherwise without abnormality. Complications:            No immediate complications. Estimated blood loss:                            Minimal. Estimated Blood Loss:     Estimated blood loss was minimal. Impression:               - One 4 mm polyp in the ascending colon, removed                            with a cold snare. Resected and retrieved.                           - Diverticulosis in the ascending colon.                           - Internal hemorrhoids.                           - The examination was otherwise normal. Recommendation:           -  Patient has a contact number available for                            emergencies. The signs and symptoms of potential                            delayed complications were discussed with the                            patient. Return to normal activities tomorrow.                            Written discharge instructions were provided to the                            patient.                           - Resume previous diet.                           -  Continue present medications.                           - Await pathology results. Remo Lipps P. , MD 01/28/2019 11:49:56 AM This report has been signed electronically.

## 2019-01-28 NOTE — Patient Instructions (Signed)
Please read handouts provided. Continue present medications. Await pathology results.     YOU HAD AN ENDOSCOPIC PROCEDURE TODAY AT [facility]:   Refer to the procedure report that was given to you for any specific questions about what was found during the examination.  If the procedure report does not answer your questions, please call your gastroenterologist to clarify.  If you requested that your care partner not be given the details of your procedure findings, then the procedure report has been included in a sealed envelope for you to review at your convenience later.  YOU SHOULD EXPECT: Some feelings of bloating in the abdomen. Passage of more gas than usual.  Walking can help get rid of the air that was put into your GI tract during the procedure and reduce the bloating. If you had a lower endoscopy (such as a colonoscopy or flexible sigmoidoscopy) you may notice spotting of blood in your stool or on the toilet paper. If you underwent a bowel prep for your procedure, you may not have a normal bowel movement for a few days.  Please Note:  You might notice some irritation and congestion in your nose or some drainage.  This is from the oxygen used during your procedure.  There is no need for concern and it should clear up in a day or so.  SYMPTOMS TO REPORT IMMEDIATELY:   Following lower endoscopy (colonoscopy or flexible sigmoidoscopy):  Excessive amounts of blood in the stool  Significant tenderness or worsening of abdominal pains  Swelling of the abdomen that is new, acute  Fever of 100F or higher   For urgent or emergent issues, a gastroenterologist can be reached at any hour by calling [phone].   DIET:  We do recommend a small meal at first, but then you may proceed to your regular diet.  Drink plenty of fluids but you should avoid alcoholic beverages for 24 hours.  ACTIVITY:  You should plan to take it easy for the rest of today and you should NOT DRIVE  or use heavy machinery until tomorrow (because of the sedation medicines used during the test).    FOLLOW UP: Our staff will call the number listed on your records 48-72 hours following your procedure to check on you and address any questions or concerns that you may have regarding the information given to you following your procedure. If we do not reach you, we will leave a message.  We will attempt to reach you two times.  During this call, we will ask if you have developed any symptoms of COVID 19. If you develop any symptoms (ie: fever, flu-like symptoms, shortness of breath, cough etc.) before then, please call [phone].  If you test positive for Covid 19 in the 2 weeks post procedure, please call and report this information to [place].    If any biopsies were taken you will be contacted by phone or by letter within the next 1-3 weeks.  Please call us at [phone] if you have not heard about the biopsies in 3 weeks.    SIGNATURES/CONFIDENTIALITY: You and/or your care partner have signed paperwork which will be entered into your electronic medical record.  These signatures attest to the fact that that the information above on your After Visit Summary has been reviewed and is understood.  Full responsibility of the confidentiality of this discharge information lies with you and/or your care-partner.

## 2019-01-28 NOTE — Progress Notes (Signed)
A and O x3. Report to RN. Tolerated MAC anesthesia well.

## 2019-01-28 NOTE — Progress Notes (Signed)
History reviewed today, VS CW, Temp KA

## 2019-01-30 ENCOUNTER — Telehealth: Payer: Self-pay | Admitting: *Deleted

## 2019-01-30 ENCOUNTER — Ambulatory Visit: Payer: 59 | Admitting: Physical Therapy

## 2019-01-30 ENCOUNTER — Telehealth: Payer: Self-pay

## 2019-01-30 NOTE — Telephone Encounter (Signed)
Left message on answering machine. 

## 2019-01-30 NOTE — Telephone Encounter (Signed)
Patient called asking where to pick up contrast for his CT scan.  Attempted to contact patient. LVM: Patient can pick up contrast with instructions at reception desk of [facility]. Asked pt to contact office for questions.

## 2019-02-01 ENCOUNTER — Ambulatory Visit: Payer: 59 | Admitting: Physical Therapy

## 2019-02-03 ENCOUNTER — Telehealth: Payer: 59 | Admitting: Family

## 2019-02-03 DIAGNOSIS — R059 Cough, unspecified: Secondary | ICD-10-CM

## 2019-02-03 DIAGNOSIS — R05 Cough: Secondary | ICD-10-CM | POA: Diagnosis not present

## 2019-02-03 MED ORDER — BENZONATATE 100 MG PO CAPS: 100.0000 mg | ORAL_CAPSULE | Freq: Three times a day (TID) | ORAL | 0 refills | Status: DC | PRN

## 2019-02-03 NOTE — Progress Notes (Signed)
E-Visit for Corona Virus Screening   Your current symptoms could be consistent with the coronavirus.  Many health care providers can now test patients at their office but not all are.  [facility] has multiple testing sites. For information on our COVID testing locations and hours go to [email or web address]  Please quarantine yourself while awaiting your test results.    You can go to one of the  testing sites listed below, while they are opened (see hours). You do not need an order and will stay in your car during the test. You do need to self isolate until your results return and if positive 14 days from when your symptoms started and until you are 3 days symptom free.   Testing Locations (Monday - Friday, 8 a.m. - 3:30 p.m.) . [place]: [facility], [address] [place], [place]: [place], [place], [place], [place] (entrance [address])  . [facility]: (Closed each Monday): Testing site relocated to the short stay covered drive at [facility]. (Use the [facility] entrance to [facility] next to [place] is a respiratory illness with symptoms that are similar to the flu. Symptoms are typically mild to moderate, but there have been cases of severe illness and death due to the virus. The following symptoms may appear 2-14 days after exposure: . Fever . Cough . Shortness of breath or difficulty breathing . Chills . Repeated shaking with chills . Muscle pain . Headache . Sore throat . New loss of taste or smell . Fatigue . Congestion or runny nose . Nausea or vomiting . Diarrhea  It is vitally important that if you feel that you have an infection such as this virus or any other virus that you stay home and away from places where you may spread it to others.  You should self-quarantine for 14 days if you have symptoms that  could potentially be coronavirus or have been in close contact a with a person diagnosed with COVID-19 within the last 2 weeks. You should avoid contact with people age 69 and older.   You should wear a mask or cloth face covering over your nose and mouth if you must be around other people or animals, including pets (even at home). Try to stay at least 6 feet away from other people. This will protect the people around you.  You can use medication such as A prescription cough medication called Tessalon Perles 100 mg. You may take 1-2 capsules every 8 hours as needed for cough. I also recommend that you start A daily Zyrtec as it could be coming from postnasal drip from allergies.  You may also take acetaminophen (Tylenol) as needed for fever.   Reduce your risk of any infection by using the same precautions used for avoiding the common cold or flu:  [place] Kitchen Wash your hands often with soap and warm water for at least 20 seconds.  If soap and water are not readily available, use an alcohol-based hand sanitizer with at least 60% alcohol.  . If coughing or sneezing, cover your mouth and nose by coughing or sneezing into the elbow areas of your shirt or coat, into a tissue or into your sleeve (not your hands). . Avoid shaking hands with others and consider head nods or verbal greetings only. . Avoid touching your eyes, nose, or mouth with unwashed hands.  . Avoid close contact  with people who are sick. . Avoid places or events with large numbers of people in one location, like concerts or sporting events. . Carefully consider travel plans you have or are making. . If you are planning any travel outside or inside the [place], visit the CDC's Travelers' Health webpage for the latest health notices. . If you have some symptoms but not all symptoms, continue to monitor at home and seek medical attention if your symptoms worsen. . If you are having a medical emergency, call 911.  HOME CARE . Only take medications as  instructed by your medical team. . Drink plenty of fluids and get plenty of rest. . A steam or ultrasonic humidifier can help if you have congestion.   GET HELP RIGHT AWAY IF YOU HAVE EMERGENCY WARNING SIGNS** FOR COVID-19. If you or someone is showing any of these signs seek emergency medical care immediately. Call 911 or proceed to your closest emergency facility if: . You develop worsening high fever. . Trouble breathing . Bluish lips or face . Persistent pain or pressure in the chest . New confusion . Inability to wake or stay awake . You cough up blood. . Your symptoms become more severe  **This list is not all possible symptoms. Contact your medical provider for any symptoms that are sever or concerning to you.   MAKE SURE YOU   Understand these instructions.  Will watch your condition.  Will get help right away if you are not doing well or get worse.  Your e-visit answers were reviewed by a board certified advanced clinical practitioner to complete your personal care plan.  Depending on the condition, your plan could have included both over the counter or prescription medications.  If there is a problem please reply once you have received a response from your provider.  Your safety is important to [place].  If you have drug allergies check your prescription carefully.    You can use MyChart to ask questions about today's visit, request a non-urgent call back, or ask for a work or school excuse for 24 hours related to this e-Visit. If it has been greater than 24 hours you will need to follow up with your provider, or enter a new e-Visit to address those concerns. You will get an e-mail in the next two days asking about your experience.  I hope that your e-visit has been valuable and will speed your recovery. Thank you for using e-visits.  Approximately 5 minutes was spent documenting and reviewing patient's chart.

## 2019-02-04 ENCOUNTER — Other Ambulatory Visit: Payer: Self-pay

## 2019-02-04 DIAGNOSIS — Z20822 Contact with and (suspected) exposure to covid-19: Secondary | ICD-10-CM

## 2019-02-05 ENCOUNTER — Encounter: Payer: 59 | Admitting: Physical Therapy

## 2019-02-05 LAB — NOVEL CORONAVIRUS, NAA: SARS-CoV-2, NAA: NOT DETECTED

## 2019-02-07 ENCOUNTER — Encounter: Payer: 59 | Admitting: Physical Therapy

## 2019-02-11 ENCOUNTER — Ambulatory Visit
Admission: EM | Admit: 2019-02-11 | Discharge: 2019-02-11 | Disposition: A | Discharge status: Home / Self Care | Payer: 59 | Attending: Emergency Medicine | Admitting: Emergency Medicine

## 2019-02-11 ENCOUNTER — Ambulatory Visit (INDEPENDENT_AMBULATORY_CARE_PROVIDER_SITE_OTHER): Payer: 59

## 2019-02-11 DIAGNOSIS — F17210 Nicotine dependence, cigarettes, uncomplicated: Secondary | ICD-10-CM | POA: Diagnosis not present

## 2019-02-11 DIAGNOSIS — R0981 Nasal congestion: Secondary | ICD-10-CM

## 2019-02-11 DIAGNOSIS — R05 Cough: Secondary | ICD-10-CM

## 2019-02-11 DIAGNOSIS — R059 Cough, unspecified: Secondary | ICD-10-CM

## 2019-02-11 IMAGING — DX DG CHEST 2V
2 series · 2 of 2 positions shown · non-contrast
Comparison: [DATE]

CLINICAL DATA: Cough for 2 weeks. History of non-Hodgkin's
lymphoma.

EXAM:
CHEST - 2 VIEW

[chest pa]
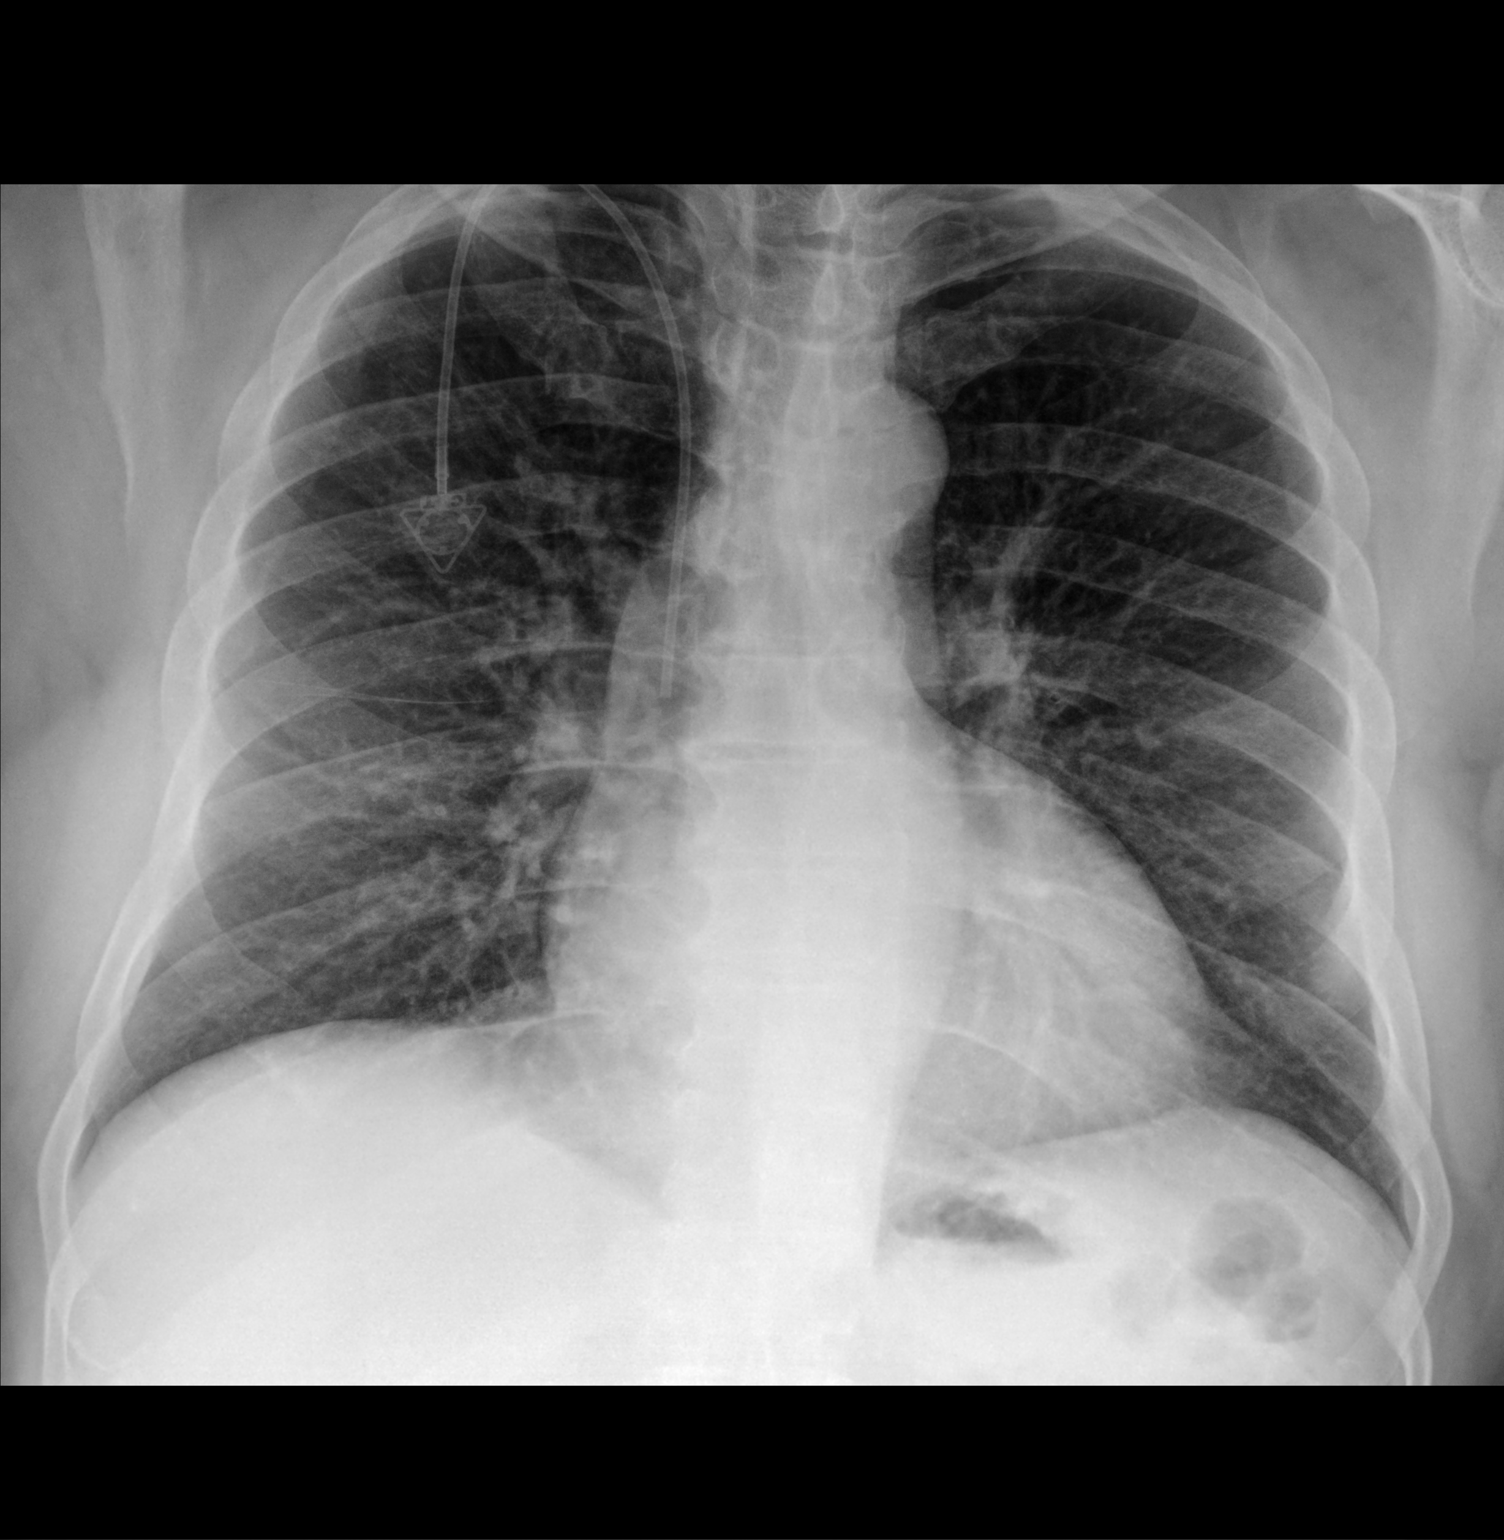

[chest lat]
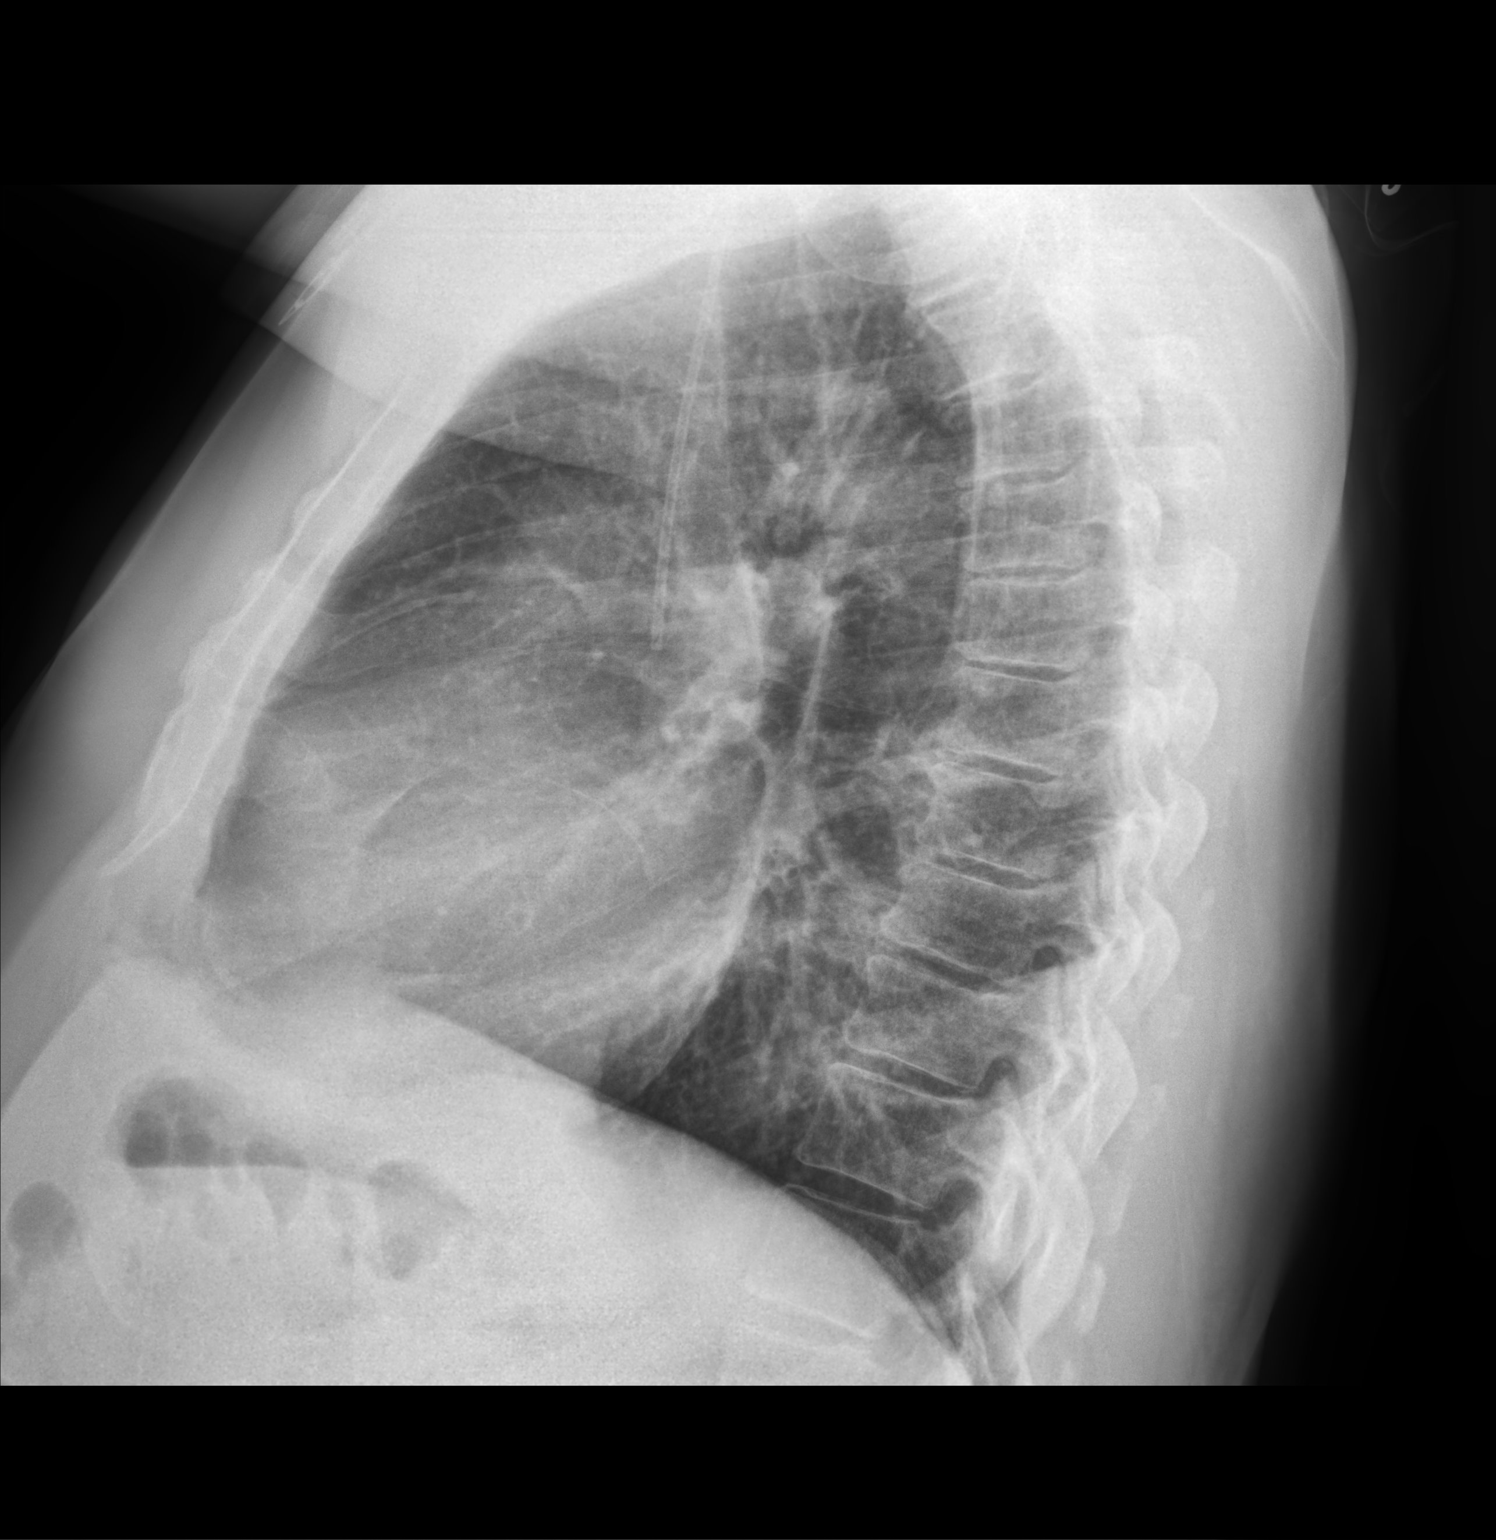

[2 of 2 positions shown; findings below may reference images not displayed]

FINDINGS: Stable right Port-A-Cath. The heart, hila, mediastinum, lungs, and
pleura are otherwise unremarkable.
IMPRESSION: No active cardiopulmonary disease.

## 2019-02-11 MED ORDER — FLUTICASONE PROPIONATE 50 MCG/ACT NA SUSP: 1.0000 | Freq: Every day | NASAL | 0 refills | Status: DC

## 2019-02-11 MED ORDER — ALBUTEROL SULFATE HFA 108 (90 BASE) MCG/ACT IN AERS: 2.0000 | INHALATION_SPRAY | RESPIRATORY_TRACT | 0 refills | Status: DC | PRN

## 2019-02-11 MED ORDER — LORATADINE 10 MG PO TABS: 10.0000 mg | ORAL_TABLET | Freq: Every day | ORAL | 0 refills | Status: DC

## 2019-02-11 MED ORDER — PREDNISONE 20 MG PO TABS: 20.0000 mg | ORAL_TABLET | Freq: Every day | ORAL | 0 refills | Status: DC

## 2019-02-11 NOTE — ED Triage Notes (Signed)
Pt c/o cough and congestion for 2wks. States had a neg. COVID test last week. States taking tessalon perls with minimal relief.

## 2019-02-11 NOTE — Discharge Instructions (Addendum)
Important to use allergy medication, Flonase every day for best results. Please albuterol inhaler for wheezing, chest tightness, shortness of breath. Take prednisone once daily: Note this may elevate your white blood cell count, so is important to tell any medical providers that you are currently on this medication. Return for worsening cough, shortness of breath, chest pain, fever.

## 2019-02-11 NOTE — ED Provider Notes (Signed)
[facility]    CSN: [id number] Arrival date & time: 02/11/19  1300      History   Chief Complaint Chief Complaint  Patient presents with  . Cough    HPI Jesus [name] is a 63 y.o. male with history of anemia, stage IV non-Hodgkin's lymphoma presenting for cough and nasal congestion for the last 2 weeks.  States is gotten worse over the last few days.  Denies hemoptysis, productivity.  Tried benzonatate without relief.  Patient denies fever, known sick contacts, shortness of breath.  Of note, patient last treated with chemoradiation greater than 6 months ago: Currently not under treatment.  Patient has a chest CT on Wednesday, intends to keep this appointment.   Past Medical History:  Diagnosis Date  . Allergy   . Anemia    during chemo  . Arthritis    knee   . Blood transfusion without reported diagnosis   . Cancer ([place])    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- last radiation 06-2018  . Cataract    removed both eyes with l;ens implants   . Family history of colon cancer    in his brother- dx'd age 88   . History of chemotherapy    last 06-01-2018  . History of kidney stones   . History of radiation therapy    last radiation 06-2018  . Hyperlipidemia    currently under control  . Hypertension   . Irregular heart beats   . Lymphadenopathy   . Pain, lower leg    Bilateral  . Peripheral arterial disease ([place])   . Pre-diabetes   . Red-green color blindness   . RLS (restless legs syndrome)   . Snores   . Wears glasses     Patient Active Problem List   Diagnosis Date Noted  . Chronic pain of right knee 10/17/2018  . Large cell (diffuse) non-Hodgkin's lymphoma ([place]) 05/10/2018  . Bacteremia due to Escherichia coli   . HTN (hypertension) 04/17/2018  . RLS (restless legs syndrome) 04/17/2018  . Abnormal LFTs 04/17/2018  . Acute metabolic encephalopathy 88/91/6945  . AKI (acute kidney injury) ([place]) 04/16/2018  .  Dehydration   . Fever, unspecified   . Sepsis ([place])   . Disorientation   . Hypokalemia   . Hypomagnesemia   . Anemia   . Encounter for antineoplastic chemotherapy   . At high risk of tumor lysis syndrome   . Swelling of lower leg   . Diffuse large B cell lymphoma ([place]) 01/15/2018  . 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[place]) 01/12/2018  . Counseling regarding advance care planning and goals of care 01/12/2018  . Bilateral leg pain 05/27/2014    Past Surgical History:  Procedure Laterality Date  . CATARACT EXTRACTION W/ INTRAOCULAR LENS  IMPLANT, BILATERAL    . COLONOSCOPY    . dislodged salava stone    . FRACTURE SURGERY    . HAND ARTHROPLASTY  1995   crushed left hand  . INGUINAL LYMPH NODE BIOPSY Left 01/02/2018   Procedure: LEFT INGUINAL LYMPH NODE BIOPSY;  Surgeon: Rolm Bookbinder, MD;  Location: [facility];  Service: General;  Laterality: Left;  . IR IMAGING GUIDED PORT INSERTION  01/15/2018  . MICROLARYNGOSCOPY Left 01/17/2014   Procedure: MICROLARYNGOSCOPY WITH EXCISION OF THE BIOPSY OF LEFT VOCAL CORD LESION;  Surgeon: Izora Gala, MD;  Location: [facility];  Service: ENT;  Laterality: Left;  . ORIF  FOOT FRACTURE  2005   left  . REFRACTIVE SURGERY Right    removed cloudiness in right eye after cataract removal        Home Medications    Prior to Admission medications   Medication Sig Start Date End Date Taking? Authorizing Provider  albuterol (VENTOLIN HFA) 108 (90 Base) MCG/ACT inhaler Inhale 2 puffs into the lungs every 4 (four) hours as needed for wheezing or shortness of breath. 02/11/19   Hall-Potvin, [place], PA-C  amLODipine (NORVASC) 10 MG tablet Take 1 tablet (10 mg total) by mouth daily. 12/14/18   Wendie Agreste, MD  aspirin 81 MG tablet Take 81 mg by mouth daily.    [provider]  B-COMPLEX-C PO Take by mouth.    [provider]  benzonatate (TESSALON PERLES) 100 MG capsule Take 1 capsule (100  mg total) by mouth 3 (three) times daily as needed. 02/03/19   Sharion Balloon, FNP  fluticasone (FLONASE) 50 MCG/ACT nasal spray Place 1 spray into both nostrils daily. 02/11/19   Hall-Potvin, [place], PA-C  glucosamine-chondroitin 500-400 MG tablet Take 1 tablet by mouth 3 (three) times daily.    [provider]  lisinopril (ZESTRIL) 20 MG tablet Take 1 tablet (20 mg total) by mouth daily. 12/14/18   Wendie Agreste, MD  loratadine (CLARITIN) 10 MG tablet Take 1 tablet (10 mg total) by mouth daily. 02/11/19   Hall-Potvin, [place], PA-C  Multiple Vitamins-Minerals (CENTRUM SILVER 50+MEN PO) Take by mouth.    [provider]  naproxen (NAPROSYN) 500 MG tablet Take 1 tablet (500 mg total) by mouth 2 (two) times daily with a meal. As needed for knee pain. 10/17/18   Wendie Agreste, MD  predniSONE (DELTASONE) 20 MG tablet Take 1 tablet (20 mg total) by mouth daily. 02/11/19   Hall-Potvin, [place], PA-C  tadalafil (CIALIS) 20 MG tablet  11/29/18   [provider]    Family History Family History  Problem Relation Age of Onset  . Breast cancer Mother   . Diabetes Father   . Hypertension Father   . Stroke Father   . Mental illness Sister   . Hypertension Daughter   . Mental illness Daughter   . Hypertension Brother   . Colon cancer Brother 63       passed away 12-13-2018  . Breast cancer Sister   . Esophageal cancer Neg Hx   . Colon polyps Neg Hx   . Rectal cancer Neg Hx   . Stomach cancer Neg Hx     Social History Social History   Tobacco Use  . Smoking status: Current Every Day Smoker    Packs/day: 0.50    Years: 36.00    Pack years: 18.00    Types: Cigarettes  . Smokeless tobacco: Never Used  . Tobacco comment: he denies smoking in about 2 weeks 07/13/18  Substance Use Topics  . Alcohol use: Yes    Alcohol/week: 11.0 standard drinks    Types: 5 Shots of liquor, 6 Cans of beer per week    Comment: weekends, he denies current alcohol use 07/13/18  . Drug  use: Not Currently    Types: Cocaine    Comment: reports cocaine usage ~2X/ month; last use 12/26/17     Allergies   Bee venom   Review of Systems Review of Systems  Constitutional: Negative for fatigue and fever.  HENT: Positive for congestion. Negative for dental problem, ear pain, facial swelling, hearing loss, sinus pain, sore throat,  trouble swallowing and voice change.   Eyes: Negative for photophobia, pain and visual disturbance.  Respiratory: Positive for cough. Negative for shortness of breath.   Cardiovascular: Negative for chest pain and palpitations.  Gastrointestinal: Negative for abdominal pain, diarrhea and vomiting.  Musculoskeletal: Negative for arthralgias and myalgias.  Skin: Negative for rash and wound.  Neurological: Negative for dizziness, speech difficulty and headaches.  All other systems reviewed and are negative.    Physical Exam Triage Vital Signs ED Triage Vitals  Enc Vitals Group     BP 02/11/19 1324 (!) 159/89     Pulse Rate 02/11/19 1324 95     Resp 02/11/19 1324 18     Temp 02/11/19 1324 98.3 F (36.8 C)     Temp Source 02/11/19 1324 Oral     SpO2 02/11/19 1324 96 %     Weight --      Height --      Head Circumference --      Peak Flow --      Pain Score 02/11/19 1325 0     Pain Loc --      Pain Edu? --      Excl. in [place]? --    No data found.  Updated Vital Signs BP (!) 159/89 (BP Location: Left Arm)   Pulse 95   Temp 98.3 F (36.8 C) (Oral)   Resp 18   SpO2 96%   Visual Acuity Right Eye Distance:   Left Eye Distance:   Bilateral Distance:    Right Eye Near:   Left Eye Near:    Bilateral Near:     Physical Exam Constitutional:      General: He is not in acute distress.    Appearance: He is not ill-appearing.  HENT:     Head: Normocephalic and atraumatic.     Right Ear: Tympanic membrane, ear canal and external ear normal.     Left Ear: Tympanic membrane, ear canal and external ear normal.     Nose: No nasal  deformity, congestion or rhinorrhea.     Comments: Turbinates nonedematous bilaterally with pink mucosa, scant mucoid discharge    Mouth/Throat:     Mouth: Mucous membranes are moist.     Tongue: Tongue does not deviate from midline.     Pharynx: Oropharynx is clear. Uvula midline.     Comments: No tonsillar hypertrophy or exudate Eyes:     General: No scleral icterus.    Conjunctiva/sclera: Conjunctivae normal.     Pupils: Pupils are equal, round, and reactive to light.  Neck:     Musculoskeletal: Normal range of motion and neck supple. No muscular tenderness.  Cardiovascular:     Rate and Rhythm: Normal rate and regular rhythm.     Heart sounds: No murmur. No gallop.   Pulmonary:     Effort: Pulmonary effort is normal. No respiratory distress.     Breath sounds: No wheezing or rales.     Comments: Decreased breath sounds bilaterally Lymphadenopathy:     Cervical: No cervical adenopathy.  Skin:    General: Skin is warm.     Capillary Refill: Capillary refill takes less than 2 seconds.     Coloration: Skin is not jaundiced or pale.  Neurological:     General: No focal deficit present.     Mental Status: He is alert.      UC Treatments / Results  Labs (all labs ordered are listed, but only abnormal results are displayed) Labs Reviewed -  No data to display  EKG   Radiology Dg Chest 2 View  Result Date: 02/11/2019 CLINICAL DATA:  Cough for 2 weeks. History of non-Hodgkin's lymphoma. EXAM: CHEST - 2 VIEW COMPARISON:  May 22, 2018 FINDINGS: Stable right Port-A-Cath. The heart, hila, mediastinum, lungs, and pleura are otherwise unremarkable. IMPRESSION: No active cardiopulmonary disease. Electronically Signed   By: Dorise Bullion III M.D   On: 02/11/2019 13:57    Procedures Procedures (including critical care time)  Medications Ordered in UC Medications - No data to display  Initial Impression / Assessment and Plan / UC Course  I have reviewed the triage vital  signs and the nursing notes.  Pertinent labs & imaging results that were available during my care of the patient were reviewed by me and considered in my medical decision making (see chart for details).     1.  Cough Given chronicity, history of lymphoma CXR obtained in office, reviewed by me and radiology: No active cardiopulmonary disease.  Relayed results to patient, who verbalized understanding.  Will start prednisone, albuterol inhaler for cough.  Flonase, allergy medication for nasal congestion.  Return precautions discussed, patient verbalized understanding and is agreeable to plan. Final Clinical Impressions(s) / UC Diagnoses   Final diagnoses:  Cough  Nasal congestion     Discharge Instructions     Important to use allergy medication, Flonase every day for best results. Please albuterol inhaler for wheezing, chest tightness, shortness of breath. Take prednisone once daily: Note this may elevate your white blood cell count, so is important to tell any medical providers that you are currently on this medication. Return for worsening cough, shortness of breath, chest pain, fever.    ED Prescriptions    Medication Sig Dispense Auth. Provider   loratadine (CLARITIN) 10 MG tablet Take 1 tablet (10 mg total) by mouth daily. 30 tablet Hall-Potvin, [place], PA-C   fluticasone (FLONASE) 50 MCG/ACT nasal spray Place 1 spray into both nostrils daily. 16 g Hall-Potvin, [place], PA-C   albuterol (VENTOLIN HFA) 108 (90 Base) MCG/ACT inhaler Inhale 2 puffs into the lungs every 4 (four) hours as needed for wheezing or shortness of breath. 18 g Hall-Potvin, [place], PA-C   predniSONE (DELTASONE) 20 MG tablet Take 1 tablet (20 mg total) by mouth daily. 5 tablet Hall-Potvin, [place], PA-C     PDMP not reviewed this encounter.   Hall-Potvin, [place], [place] 02/12/19 2014

## 2019-02-12 ENCOUNTER — Encounter: Payer: 59 | Admitting: Physical Therapy

## 2019-02-13 ENCOUNTER — Ambulatory Visit (HOSPITAL_COMMUNITY)
Admission: RE | Admit: 2019-02-13 | Discharge: 2019-02-13 | Disposition: A | Discharge status: Home / Self Care | Payer: 59 | Source: Ambulatory Visit | Attending: Hematology | Admitting: Hematology

## 2019-02-13 ENCOUNTER — Other Ambulatory Visit: Payer: Self-pay

## 2019-02-13 ENCOUNTER — Inpatient Hospital Stay: Payer: 59 | Attending: Oncology

## 2019-02-13 DIAGNOSIS — C8330 Diffuse large B-cell lymphoma, unspecified site: Secondary | ICD-10-CM | POA: Insufficient documentation

## 2019-02-13 DIAGNOSIS — C8338 Diffuse large B-cell lymphoma, lymph nodes of multiple sites: Secondary | ICD-10-CM | POA: Insufficient documentation

## 2019-02-13 DIAGNOSIS — Z23 Encounter for immunization: Secondary | ICD-10-CM | POA: Insufficient documentation

## 2019-02-13 LAB — CMP (CANCER CENTER ONLY)
ALT: 11 U/L (ref 0–44)
AST: 13 U/L — ABNORMAL LOW (ref 15–41)
Albumin: 4.2 g/dL (ref 3.5–5.0)
Alkaline Phosphatase: 65 U/L (ref 38–126)
Anion gap: 11 (ref 5–15)
BUN: 17 mg/dL (ref 8–23)
CO2: 25 mmol/L (ref 22–32)
Calcium: 9.5 mg/dL (ref 8.9–10.3)
Chloride: 107 mmol/L (ref 98–111)
Creatinine: 1.02 mg/dL (ref 0.61–1.24)
GFR, Est AFR Am: >60 mL/min (ref >60)
GFR, Estimated: >60 mL/min (ref >60)
Glucose, Bld: 127 mg/dL — ABNORMAL HIGH (ref 70–99)
Potassium: 3.6 mmol/L (ref 3.5–5.1)
Sodium: 143 mmol/L (ref 135–145)
Total Bilirubin: 0.4 mg/dL (ref 0.3–1.2)
Total Protein: 7 g/dL (ref 6.5–8.1)

## 2019-02-13 LAB — CBC WITH DIFFERENTIAL/PLATELET
Abs Immature Granulocytes: 0.04 10*3/uL (ref 0.00–0.07)
Basophils Absolute: 0 10*3/uL (ref 0.0–0.1)
Basophils Relative: 1 %
Eosinophils Absolute: 0.1 10*3/uL (ref 0.0–0.5)
Eosinophils Relative: 1 %
HCT: 38.7 % — ABNORMAL LOW (ref 39.0–52.0)
Hemoglobin: 12.7 g/dL — ABNORMAL LOW (ref 13.0–17.0)
Immature Granulocytes: 1 %
Lymphocytes Relative: 35 %
Lymphs Abs: 2.8 10*3/uL (ref 0.7–4.0)
MCH: 30.4 pg (ref 26.0–34.0)
MCHC: 32.8 g/dL (ref 30.0–36.0)
MCV: 92.6 fL (ref 80.0–100.0)
Monocytes Absolute: 0.7 10*3/uL (ref 0.1–1.0)
Monocytes Relative: 8 %
Neutro Abs: 4.4 10*3/uL (ref 1.7–7.7)
Neutrophils Relative %: 54 %
Platelets: 269 10*3/uL (ref 150–400)
RBC: 4.18 MIL/uL — ABNORMAL LOW (ref 4.22–5.81)
RDW: 13.4 % (ref 11.5–15.5)
WBC: 8.1 10*3/uL (ref 4.0–10.5)
nRBC: 0 % (ref 0.0–0.2)

## 2019-02-13 LAB — LACTATE DEHYDROGENASE: LDH: 180 U/L (ref 98–192)

## 2019-02-13 IMAGING — CT CT ABD-PELV W/ CM
3 of 5 series · 15 of 36 positions shown, 17 images · IV contrast (OMNIPAQUE)
Comparison: [DATE] PET.

CLINICAL DATA: Diffuse B-cell lymphoma. Evaluate treatment
response. No current complaints.

EXAM:
CT CHEST, ABDOMEN, AND PELVIS WITH CONTRAST
TECHNIQUE: Multidetector CT imaging of the chest, abdomen and pelvis was
performed following the standard protocol during bolus
administration of intravenous contrast.
CONTRAST:  100mL OMNIPAQUE IOHEXOL 300 MG/ML  SOLN

[Series 2: cap with · axial · 0.77mm/px · z∈[-582,-87]mm · 8 of 129 slices shown, 10 images]
[im 15/129  mediastinal]
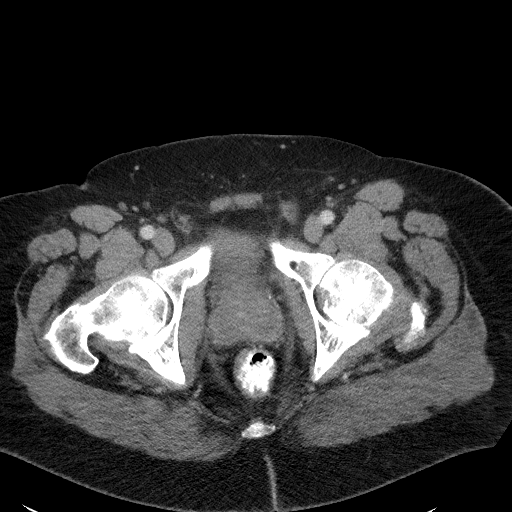
[im 15/129  lung]
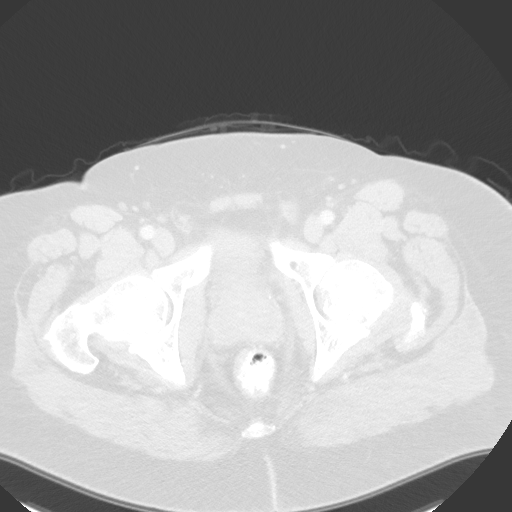
[im 29/129  lung]
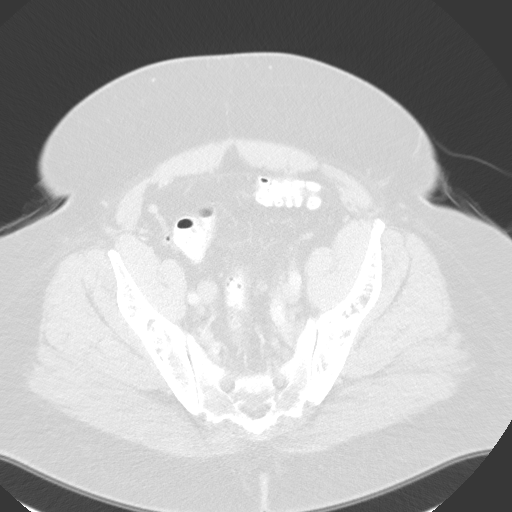
[im 43/129  lung]
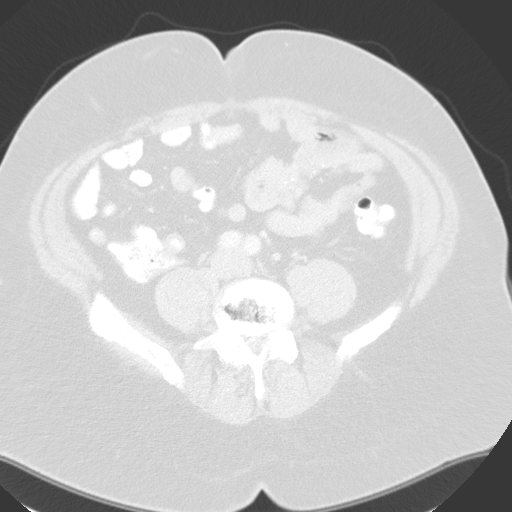
[im 57/129  lung]
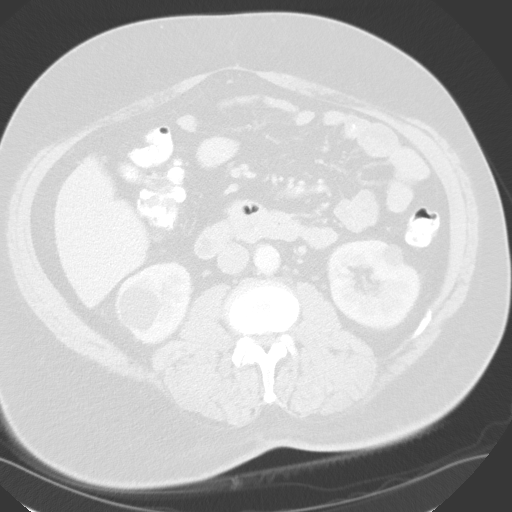
[im 72/129  mediastinal]
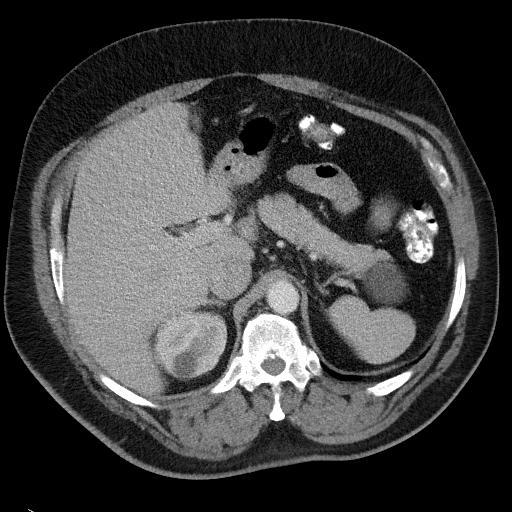
[im 72/129  lung]
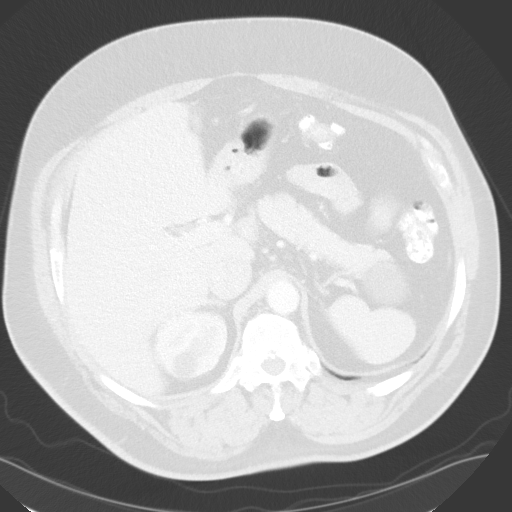
[im 86/129  lung]
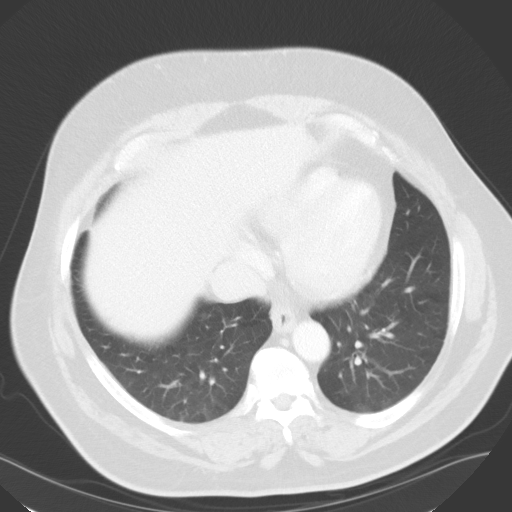
[im 100/129  lung]
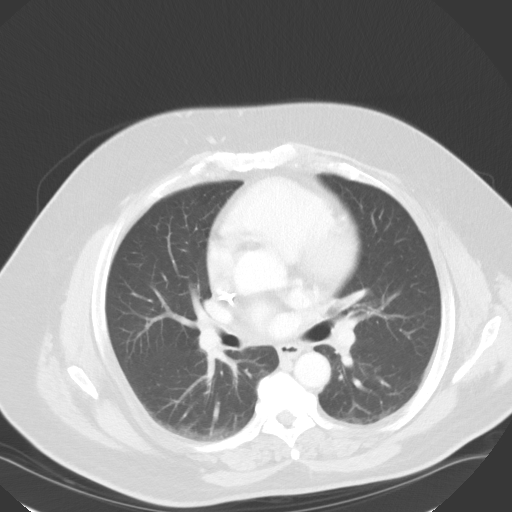
[im 114/129  lung]
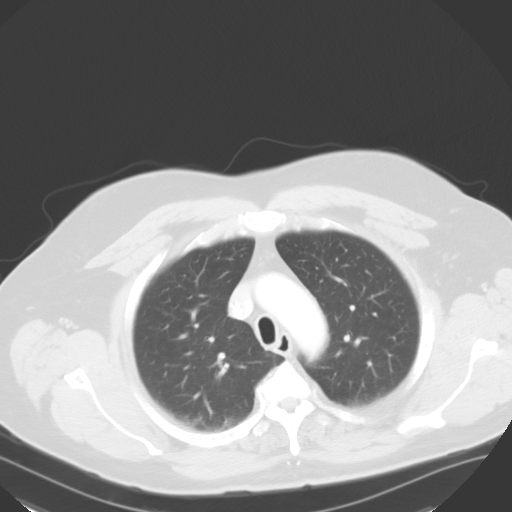

[Series 5: coronals · coronal · 0.85mm/px · 3 of 178 slices shown]
[im 36/178  lung]
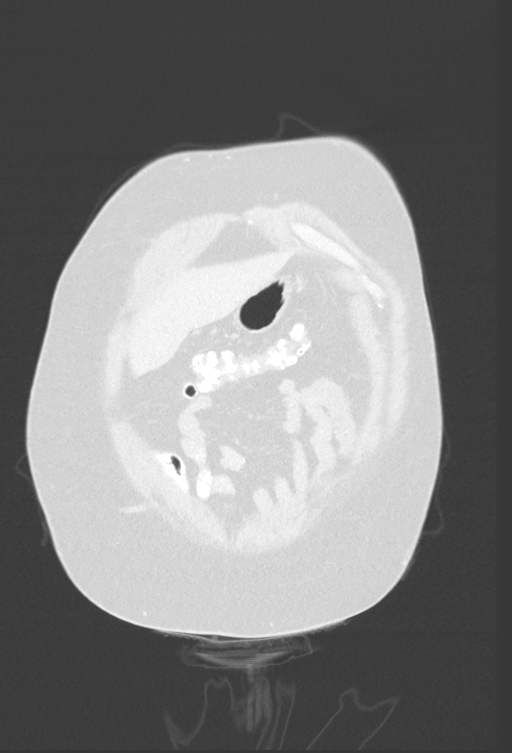
[im 71/178  lung]
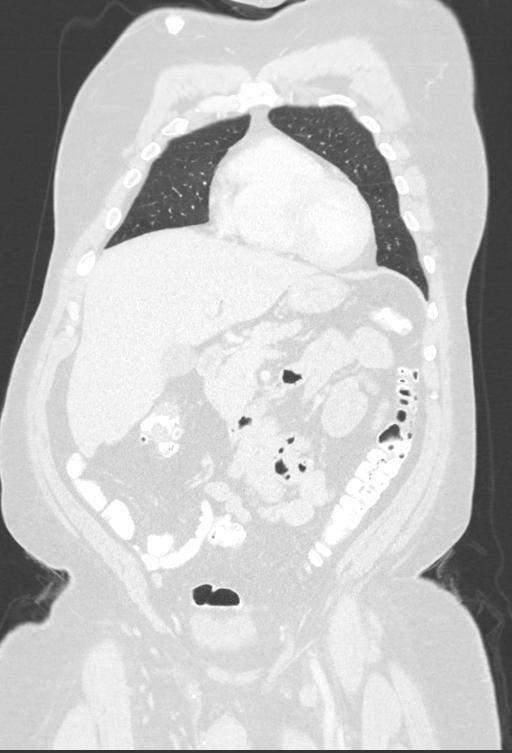
[im 107/178  lung]
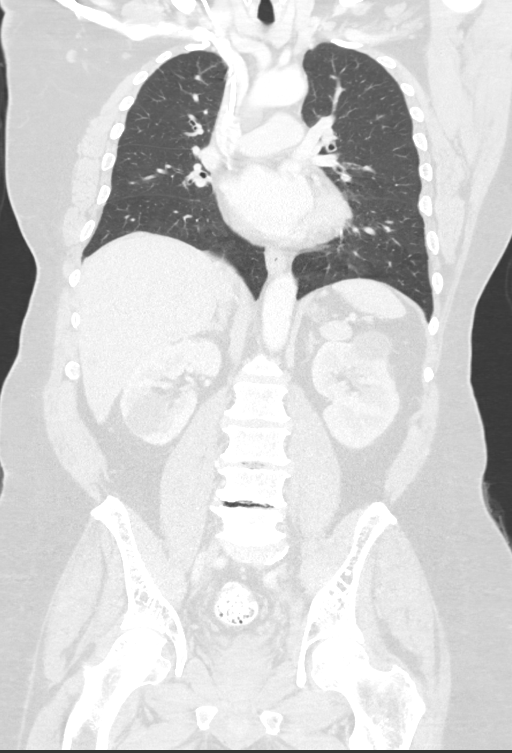

[Series 7: lung · axial · 0.77mm/px · z∈[-312,-188]mm · 4 of 163 slices shown]
[im 13/163  lung]
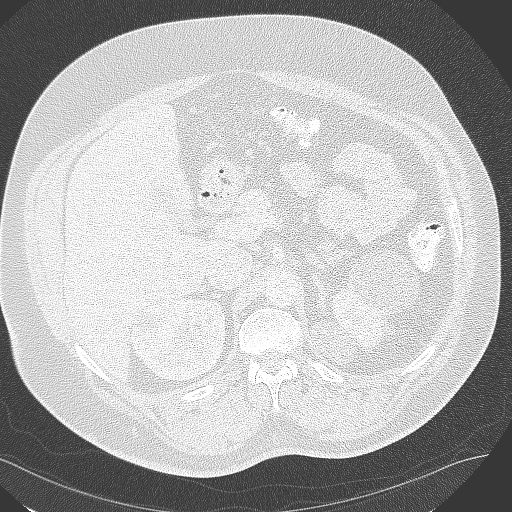
[im 38/163  lung]
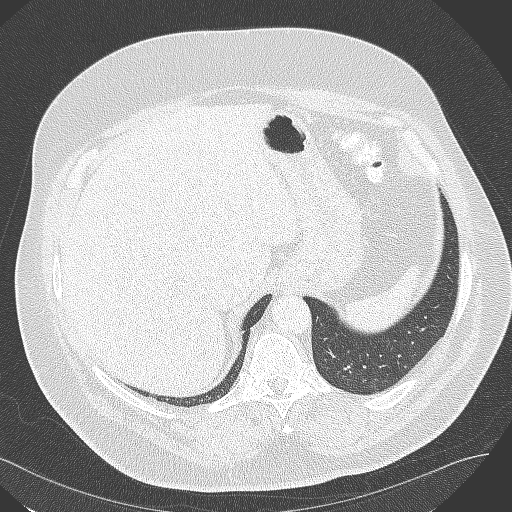
[im 50/163  lung]
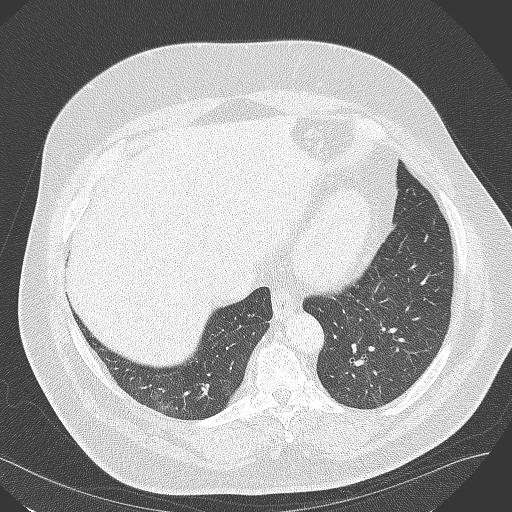
[im 75/163  lung]
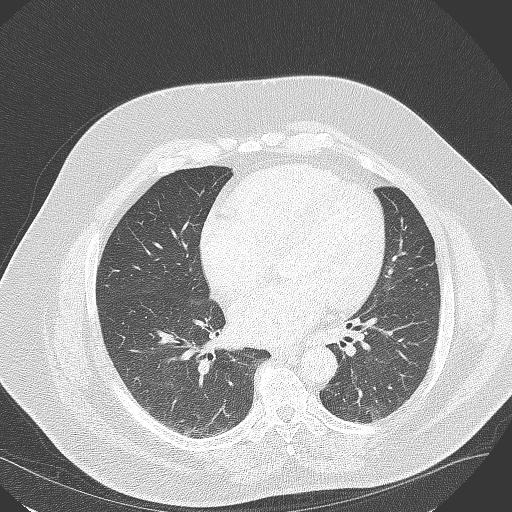

[15 of 36 positions shown; findings below may reference images not displayed]

FINDINGS: CT CHEST FINDINGS

Cardiovascular: Right Port-A-Cath tip at high right atrium. Normal
caliber of the aorta and branch vessels. borderline cardiomegaly,
without pericardial effusion. No central pulmonary embolism, on this
non-dedicated study.

Mediastinum/Nodes: No supraclavicular adenopathy. No axillary
adenopathy. No mediastinal or hilar adenopathy. No internal mammary
adenopathy.

Lungs/Pleura: No pleural fluid.  Clear lungs.

Musculoskeletal: No acute osseous abnormality.

CT ABDOMEN PELVIS FINDINGS

Hepatobiliary: Hepatomegaly at 19.5 cm craniocaudal. No focal liver
lesion. Normal gallbladder, without biliary ductal dilatation.

Pancreas: Normal, without mass or ductal dilatation.

Spleen: Normal in size, without focal abnormality.

Adrenals/Urinary Tract: Normal adrenal glands. Bilateral low-density
renal lesions, most consistent with cysts and minimally complex
cysts. No hydronephrosis. Normal urinary bladder.

Stomach/Bowel: Normal stomach, without wall thickening. Scattered
colonic diverticula. Normal terminal ileum and appendix. Normal
small bowel.

Vascular/Lymphatic: Aortic and branch vessel atherosclerosis.

Index left inguinal node measures 1.4 x 2.2 cm on 119/2 versus 1.8 x
2.7 cm on the prior exam.

Left external iliac node measures 1.0 cm on 110/2 versus 2.0 cm on
the prior.

Reproductive: Mild prostatomegaly.

Other: No significant free fluid. Tiny periumbilical fat containing
ventral abdominal wall hernia.

Musculoskeletal: Pelvic cortical thickening and trabeculation,
similar. Lumbosacral spondylosis.
IMPRESSION: 1. Response to therapy of pelvic adenopathy compared to the PET of
[DATE].
2. No new or progressive disease.
3. Mild prostatomegaly.
4. Pelvic cortical thickening and trabeculation are similar, likely
related to Paget's disease.
5. Hepatomegaly.

## 2019-02-13 IMAGING — CT CT CHEST W/ CM
3 of 5 series · 15 of 36 positions shown, 17 images · IV contrast (OMNIPAQUE)
Comparison: [DATE] PET.

CLINICAL DATA: Diffuse B-cell lymphoma. Evaluate treatment
response. No current complaints.

EXAM:
CT CHEST, ABDOMEN, AND PELVIS WITH CONTRAST
TECHNIQUE: Multidetector CT imaging of the chest, abdomen and pelvis was
performed following the standard protocol during bolus
administration of intravenous contrast.
CONTRAST:  100mL OMNIPAQUE IOHEXOL 300 MG/ML  SOLN

[Series 2: cap with · axial · 0.77mm/px · z∈[-582,-87]mm · 8 of 129 slices shown, 10 images]
[im 15/129  mediastinal]
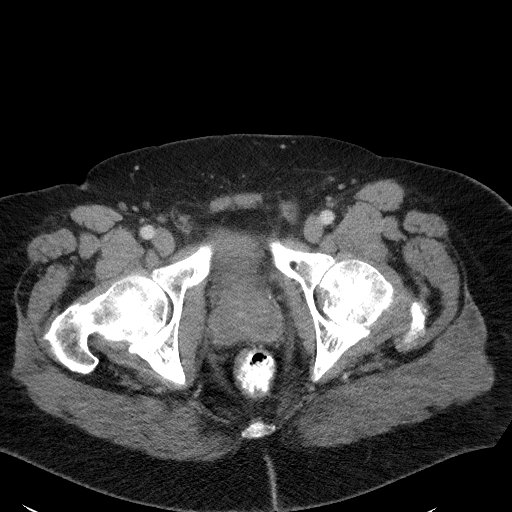
[im 15/129  lung]
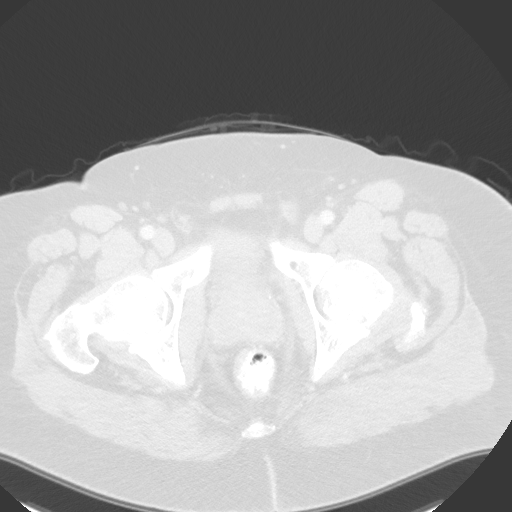
[im 29/129  lung]
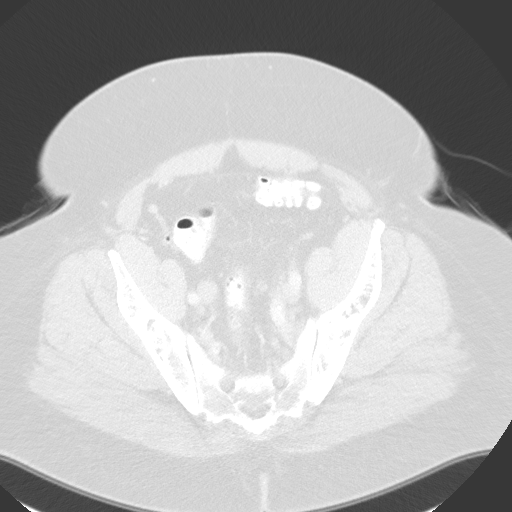
[im 43/129  lung]
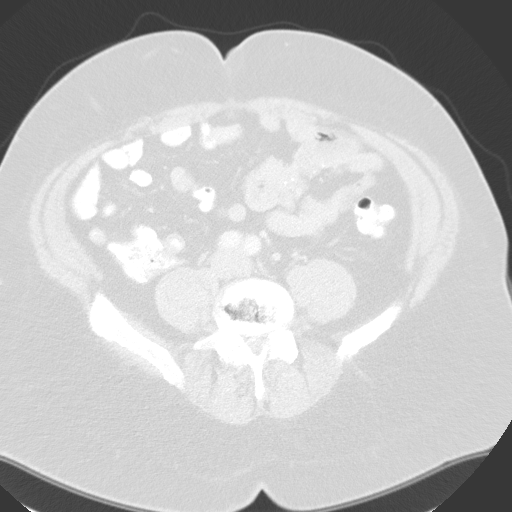
[im 57/129  lung]
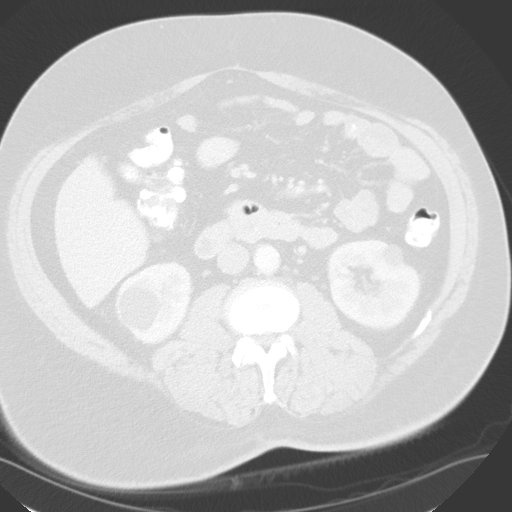
[im 72/129  mediastinal]
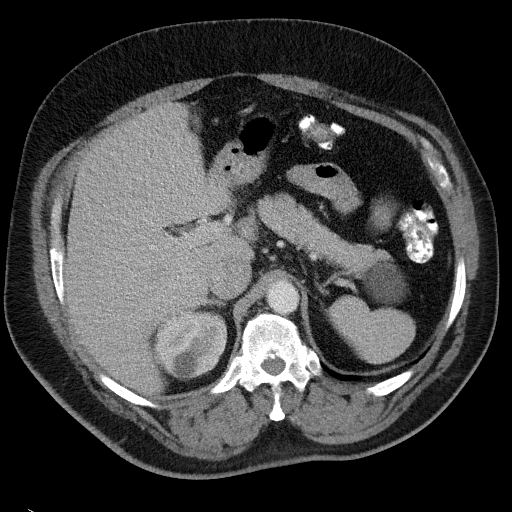
[im 72/129  lung]
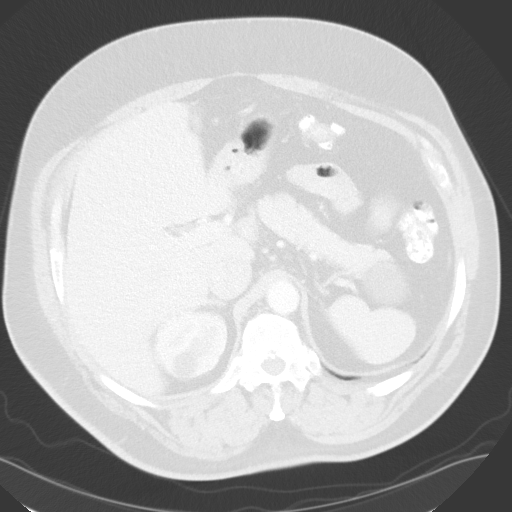
[im 86/129  lung]
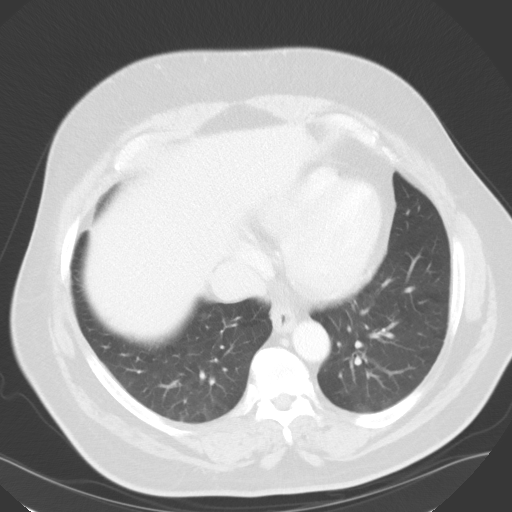
[im 100/129  lung]
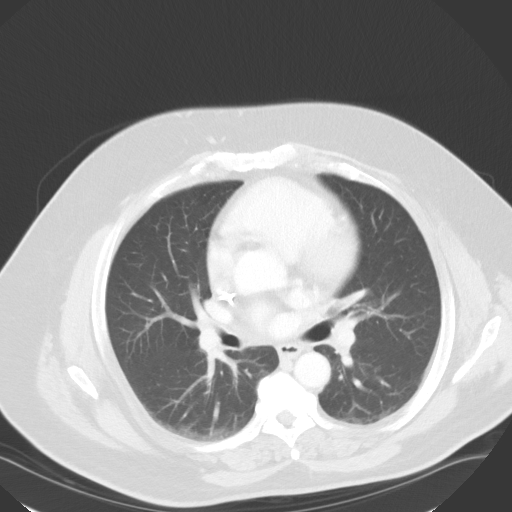
[im 114/129  lung]
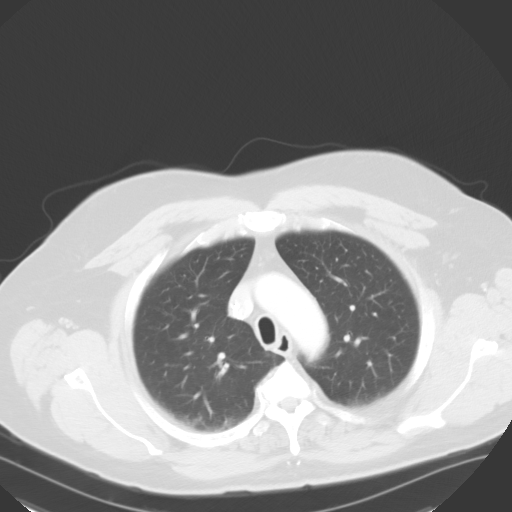

[Series 5: coronals · coronal · 0.85mm/px · 3 of 178 slices shown]
[im 36/178  lung]
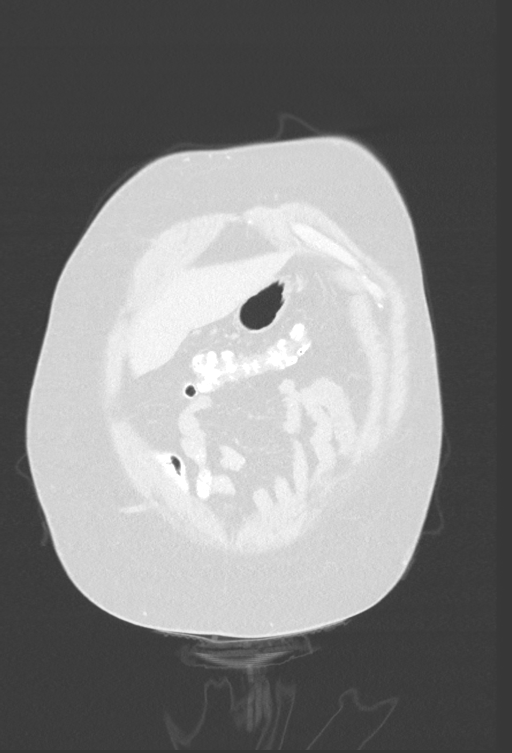
[im 71/178  lung]
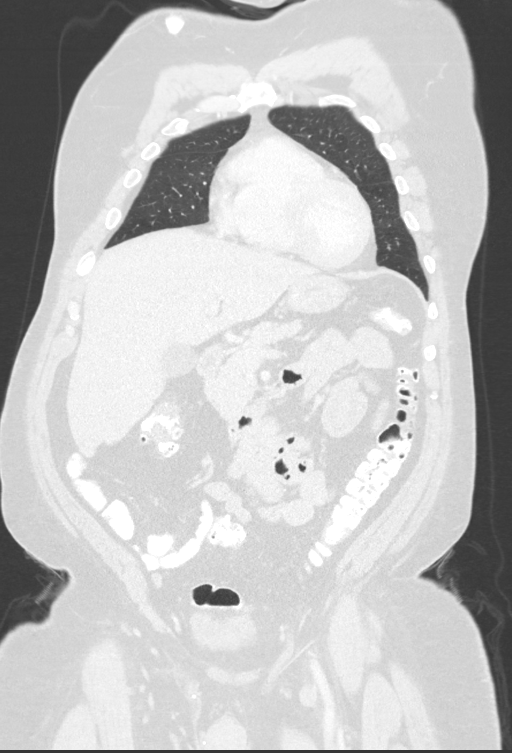
[im 107/178  lung]
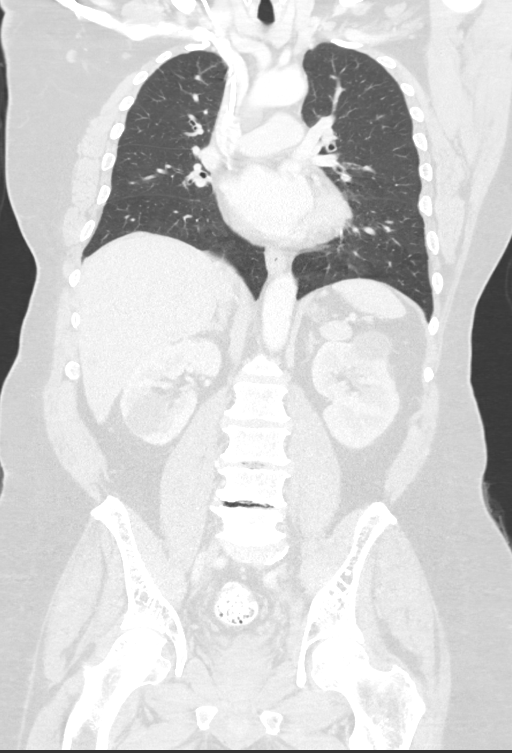

[Series 7: lung · axial · 0.77mm/px · z∈[-312,-188]mm · 4 of 163 slices shown]
[im 13/163  lung]
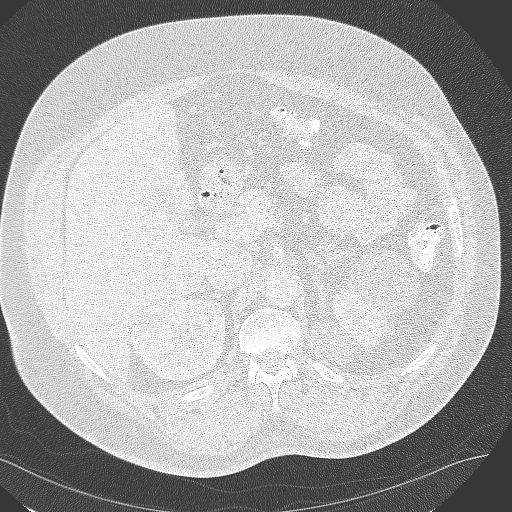
[im 38/163  lung]
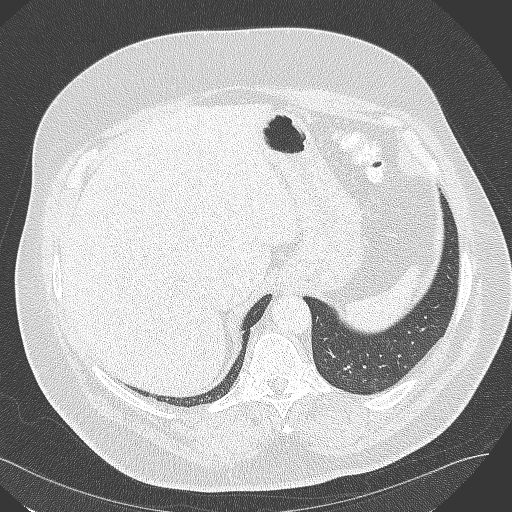
[im 50/163  lung]
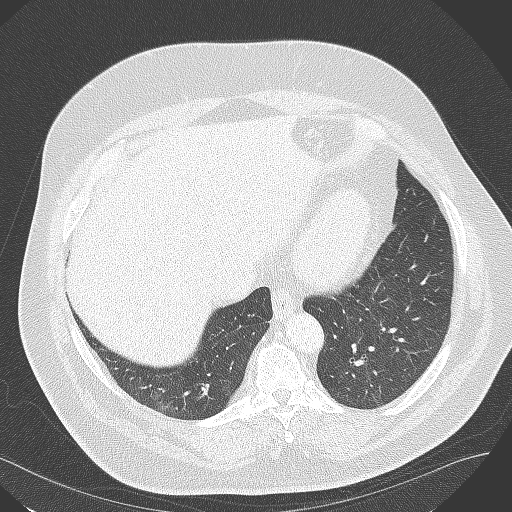
[im 75/163  lung]
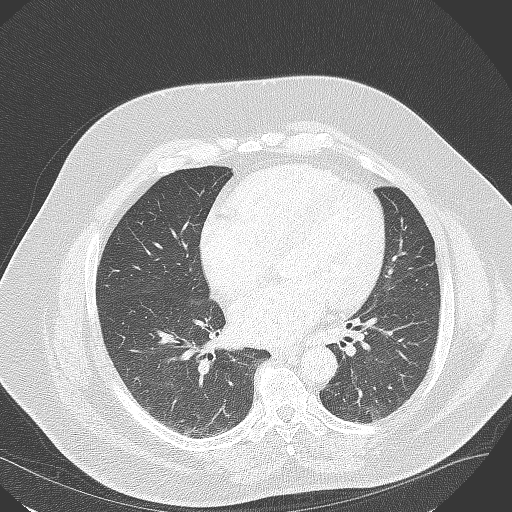

[15 of 36 positions shown; findings below may reference images not displayed]

FINDINGS: CT CHEST FINDINGS

Cardiovascular: Right Port-A-Cath tip at high right atrium. Normal
caliber of the aorta and branch vessels. borderline cardiomegaly,
without pericardial effusion. No central pulmonary embolism, on this
non-dedicated study.

Mediastinum/Nodes: No supraclavicular adenopathy. No axillary
adenopathy. No mediastinal or hilar adenopathy. No internal mammary
adenopathy.

Lungs/Pleura: No pleural fluid.  Clear lungs.

Musculoskeletal: No acute osseous abnormality.

CT ABDOMEN PELVIS FINDINGS

Hepatobiliary: Hepatomegaly at 19.5 cm craniocaudal. No focal liver
lesion. Normal gallbladder, without biliary ductal dilatation.

Pancreas: Normal, without mass or ductal dilatation.

Spleen: Normal in size, without focal abnormality.

Adrenals/Urinary Tract: Normal adrenal glands. Bilateral low-density
renal lesions, most consistent with cysts and minimally complex
cysts. No hydronephrosis. Normal urinary bladder.

Stomach/Bowel: Normal stomach, without wall thickening. Scattered
colonic diverticula. Normal terminal ileum and appendix. Normal
small bowel.

Vascular/Lymphatic: Aortic and branch vessel atherosclerosis.

Index left inguinal node measures 1.4 x 2.2 cm on 119/2 versus 1.8 x
2.7 cm on the prior exam.

Left external iliac node measures 1.0 cm on 110/2 versus 2.0 cm on
the prior.

Reproductive: Mild prostatomegaly.

Other: No significant free fluid. Tiny periumbilical fat containing
ventral abdominal wall hernia.

Musculoskeletal: Pelvic cortical thickening and trabeculation,
similar. Lumbosacral spondylosis.
IMPRESSION: 1. Response to therapy of pelvic adenopathy compared to the PET of
[DATE].
2. No new or progressive disease.
3. Mild prostatomegaly.
4. Pelvic cortical thickening and trabeculation are similar, likely
related to Paget's disease.
5. Hepatomegaly.

## 2019-02-13 MED ORDER — SODIUM CHLORIDE (PF) 0.9 % IJ SOLN
INTRAMUSCULAR | Status: AC
  Filled 2019-02-13: qty 50

## 2019-02-13 MED ORDER — IOHEXOL 300 MG/ML  SOLN
100.0000 mL | Freq: Once | INTRAMUSCULAR | Status: AC | PRN
  Administered 2019-02-13: 100 mL via INTRAVENOUS

## 2019-02-14 ENCOUNTER — Encounter: Payer: 59 | Admitting: Physical Therapy

## 2019-02-15 NOTE — Progress Notes (Signed)
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 male is here because of left lower extremity edema and lymphadenopathy.  The patient was seen in the emergency room this past Friday for the same issue.  A CT of the abdomen and pelvis was performed showing bulky left inguinal, left hemipelvic, and retroperitoneal adenopathy.  He was referred to [place] from the emergency room for further evaluation.  Doppler ultrasound of the left lower extremity was performed and was negative for DVT. Patient reports that he has been having left lower extremity edema in his left groin and left leg for approximately 1 month.  He states that the swelling in the left groin started to get better but then worsened.  The left lower extremity edema has slowly worsened over time.  Patient denies having fevers and chills.  He reports that he does have night sweats at times.  He reported having headaches approximate 1 month [place]  He thinks his headaches are related to not having his blood pressure medication.  His headaches have now resolved.  He denies visual changes.  The patient denies chest pain, shortness of breath and cough.  No nausea, vomiting, constipation, diarrhea.  Denies abdominal pain.  The patient denies recent weight loss and has actually gained weight recently.  Patient denies epistaxis, bleeding gums, hemoptysis, hematuria, but occasionally, and melena.  He reports increased urinary frequency over the past month but no dysuria.  The patient is here for evaluation and discussion of his recent CT and lab findings.  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 The patient's last visit with [place] was on 11/21/2018. [name].  The pt reports that he did have a cold but he has since improved. His PT was delayed due to his respiratory symptoms but he is scheduled to start on 02/25/2019. His knee pain has continued. Pt has been trying to get out more and has been walking in the park. He reports that he has also been trying to eat healthier. He has no more concerns about his ED at this time. Pt has not his annual flu vaccine and is interested in getting it today. He also had Shingles and is interested in getting a Shingles vaccine. Pt had a trips planned for [facility] and [place] but has held off on taking it due to the pandemic. He is ready to get his port removed. Dr. Laurence Ferrari did his port placement.   Of note since the patient's last visit, pt has had CT C/A/P ([id number]) ([id number]) completed on 02/13/2019 with results revealing "1. Response to therapy of pelvic adenopathy compared to the PET of 06/28/2018. 2. No new or progressive disease. 3. Mild prostatomegaly. 4. 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 5. Hepatomegaly."  Lab results (02/13/19) of CBC w/diff and CMP is as follows: all values are WNL except for RBC at 4.18, Hgb at 12.7, HCT at 38.7, Glucose at 127, AST at 13.  02/13/2019 LDH at 180  On review of systems, pt reports chronic knee pain and denies leg swelling, abdominal pain, fevers, chills, night sweats, bowel movement changes, urinary changes, hip pain and any other symptoms.   MEDICAL HISTORY:   Past Medical History:  Diagnosis Date   Allergy    Anemia    during chemo   Arthritis    knee    Blood transfusion without reported diagnosis    Cancer ([address])    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- last  radiation 06-2018   Cataract    removed both eyes with l;ens implants    Family history of colon cancer    in his brother- dx'd age 34    History of chemotherapy    last 06-01-2018   History of kidney stones    History of radiation therapy    last radiation 06-2018   Hyperlipidemia    currently under control   Hypertension    Irregular heart beats    Lymphadenopathy    Pain, lower leg    Bilateral   Peripheral arterial disease (HCC)    Pre-diabetes    Red-green color blindness    RLS (restless legs syndrome)    Snores    Wears glasses     SURGICAL HISTORY: Past Surgical History:  Procedure Laterality Date   CATARACT EXTRACTION W/ INTRAOCULAR LENS  IMPLANT, BILATERAL     COLONOSCOPY     dislodged salava stone     FRACTURE SURGERY     HAND ARTHROPLASTY  1995   crushed left hand   INGUINAL LYMPH NODE BIOPSY Left 01/02/2018   Procedure: LEFT INGUINAL LYMPH NODE BIOPSY;  Surgeon: Rolm Bookbinder, MD;  Location: [facility];  Service: General;  Laterality: Left;   IR IMAGING GUIDED PORT INSERTION  01/15/2018   MICROLARYNGOSCOPY Left 01/17/2014   Procedure: MICROLARYNGOSCOPY WITH EXCISION OF THE BIOPSY OF LEFT VOCAL CORD LESION;  Surgeon: Izora Gala, MD;  Location: [facility];  Service: ENT;  Laterality: Left;   ORIF FOOT FRACTURE  2005   left   REFRACTIVE SURGERY Right    removed cloudiness in right eye after cataract removal     SOCIAL HISTORY: Social History   Socioeconomic History   Marital status: Divorced    Spouse name: Not on file   Number of children: 3   Years of education: Not on file   Highest education level: Not on file  Occupational History   Not on file  Social Needs   Financial resource strain: Not on file   Food insecurity    Worry: Not on file    Inability: Not on file   Transportation needs    Medical: No    Non-medical: No  Tobacco Use   Smoking status: Current Every Day Smoker    Packs/day: 0.50      Years: 36.00    Pack years: 18.00    Types: Cigarettes   Smokeless tobacco: Never Used   Tobacco comment: he denies smoking in about 2 weeks 07/13/18  Substance and Sexual Activity   Alcohol use: Yes    Alcohol/week: 11.0 standard drinks    Types: 5 Shots of liquor, 6 Cans of beer per week    Comment: weekends, he denies current alcohol use 07/13/18   Drug use: Not Currently    Types: Cocaine    Comment: reports cocaine usage ~2X/ month; last use 12/26/17   Sexual activity: Not on file  Lifestyle   Physical activity    Days per week: Not on file    Minutes per session: Not on file   Stress: Not on file  Relationships   Social connections    Talks on phone: Not on file    Gets together: Not on file    Attends religious service: Not on file    Active member of club or organization: Not on file    Attends meetings of clubs or organizations: Not on file    Relationship status: Not on file   Intimate partner violence    Fear of current or ex partner: No    Emotionally abused: No    Physically abused: No    Forced sexual activity: No  Other Topics Concern   Not on file  Social History Narrative   Not on file    FAMILY HISTORY: Family History  Problem Relation Age of Onset   Breast cancer Mother    Diabetes Father    Hypertension Father    Stroke Father    Mental illness Sister    Hypertension Daughter    Mental illness Daughter    Hypertension Brother    Colon cancer Brother 56       passed away 2018-12-22   Breast cancer Sister    Esophageal cancer Neg Hx    Colon polyps Neg Hx    Rectal cancer Neg Hx    Stomach cancer Neg Hx     ALLERGIES:  is allergic to bee venom.  MEDICATIONS:  Current Outpatient Medications  Medication Sig Dispense Refill   albuterol (VENTOLIN HFA) 108 (90 Base) MCG/ACT inhaler Inhale 2 puffs into the lungs every 4 (four) hours as needed for wheezing or shortness of breath. 18 g 0   amLODipine (NORVASC) 10 MG  tablet Take 1 tablet (10 mg total) by mouth daily. 90 tablet 1   aspirin 81 MG tablet Take 81 mg by mouth daily.     B-COMPLEX-C PO Take by mouth.     benzonatate (TESSALON PERLES) 100 MG capsule Take 1 capsule (100 mg total) by mouth 3 (three) times daily as needed. 20 capsule 0   fluticasone (FLONASE) 50 MCG/ACT nasal spray Place 1 spray into both nostrils daily. 16 g 0   glucosamine-chondroitin 500-400 MG tablet Take 1 tablet by mouth 3 (three) times daily.     lisinopril (ZESTRIL) 20 MG tablet Take 1 tablet (20 mg total) by mouth daily. 90 tablet 1   loratadine (CLARITIN) 10 MG tablet Take 1 tablet (10 mg total) by mouth daily. 30 tablet 0   Multiple Vitamins-Minerals (CENTRUM SILVER 50+MEN PO) Take by mouth.     naproxen (NAPROSYN) 500 MG tablet Take 1 tablet (500 mg total) by mouth 2 (two) times daily with a meal. As needed for knee pain. 30 tablet 0   predniSONE (DELTASONE) 20 MG tablet Take 1 tablet (20 mg total) by mouth daily. 5 tablet 0   tadalafil (CIALIS) 20 MG tablet      No current facility-administered medications for this visit.     REVIEW OF SYSTEMS:    A 10+ POINT REVIEW OF SYSTEMS WAS OBTAINED including neurology, dermatology, psychiatry, cardiac, respiratory, lymph, extremities, GI, GU, Musculoskeletal, constitutional, breasts, reproductive, HEENT.  All pertinent positives are noted in the HPI.  All others are negative.   PHYSICAL EXAMINATION: ECOG PERFORMANCE STATUS: 1 - Symptomatic but completely ambulatory  Vitals:   02/20/19 1148  BP: 132/85  Pulse: 77  Resp: 18  Temp: 99.1 F (37.3 C)  SpO2: 99%   Filed Weights   02/20/19 1148  Weight: 290 lb 12.8 oz (131.9 kg)    GENERAL:alert, in no acute distress and comfortable SKIN: no acute rashes, no significant lesions EYES: conjunctiva are pink and non-injected, sclera anicteric OROPHARYNX: MMM, no exudates, no oropharyngeal erythema or ulceration NECK: supple, no JVD LYMPH:  no palpable  lymphadenopathy in the cervical, axillary or inguinal regions LUNGS: clear to auscultation b/l with normal respiratory effort HEART: regular rate & rhythm ABDOMEN:  normoactive bowel sounds , non tender, not distended. No palpable hepatosplenomegaly.  Extremity: no pedal edema PSYCH: alert & oriented x 3 with fluent speech NEURO: no focal motor/sensory deficits   LABORATORY DATA:   I have reviewed the data as listed CBC Latest Ref Rng & Units 02/13/2019 11/21/2018 09/12/2018  WBC 4.0 - 10.5 K/uL 8.1 6.6 5.6  Hemoglobin 13.0 - 17.0 g/dL 12.7(L) 11.9(L) 11.8(L)  Hematocrit 39.0 - 52.0 % 38.7(L) 35.6(L) 35.5(L)  Platelets 150 - 400 K/uL 269 225 246   CBC    Component Value Date/Time   WBC 8.1 02/13/2019 0803   RBC 4.18 (L) 02/13/2019 0803   HGB 12.7 (L) 02/13/2019 0803   HCT 38.7 (L) 02/13/2019 0803   PLT 269 02/13/2019 0803   MCV 92.6 02/13/2019 0803   MCV 87.3 12/22/2017 1518   MCH 30.4 02/13/2019 0803   MCHC 32.8 02/13/2019 0803   RDW 13.4 02/13/2019 0803   LYMPHSABS 2.8 02/13/2019 0803   MONOABS 0.7 02/13/2019 0803   EOSABS 0.1 02/13/2019 0803   BASOSABS 0.0 02/13/2019 0803    CMP Latest Ref Rng & Units 02/13/2019 11/21/2018 09/12/2018  Glucose 70 - 99 mg/dL 127(H) 99 79  BUN 8 - 23 mg/dL 17 15 20   Creatinine 0.61 - 1.24 mg/dL 1.02 0.93 1.00  Sodium 135 - 145 mmol/L 143 143 145  Potassium 3.5 - 5.1 mmol/L 3.6 3.9 4.1  Chloride 98 - 111 mmol/L 107 107 107  CO2 22 - 32 mmol/L 25 26 27   Calcium 8.9 - 10.3 mg/dL 9.5 8.9 8.9  Total Protein 6.5 - 8.1 g/dL 7.0 6.6 6.7  Total Bilirubin 0.3 - 1.2 mg/dL 0.4 0.9 0.6  Alkaline Phos 38 - 126 U/L 65 55 58  AST 15 - 41 U/L 13(L) 17 22  ALT 0 - 44 U/L 11 14 14    . Lab Results  Component Value Date   LDH 180 02/13/2019    01/02/18 Left Inguinal LN Bx:   12/26/17 Tissue Flow Cytometry:   12/26/17 Inguinal Core biopsy:    RADIOGRAPHIC STUDIES: I have personally reviewed the radiological images as listed and agreed with the  findings in the report. Dg Chest 2 View  Result Date: 02/11/2019 CLINICAL DATA:  Cough for 2 weeks. History of non-Hodgkin's lymphoma. EXAM: CHEST - 2 VIEW COMPARISON:  May 22, 2018 FINDINGS: Stable right Port-A-Cath. The heart, hila, mediastinum, lungs, and pleura are otherwise unremarkable. IMPRESSION: No active cardiopulmonary disease. Electronically Signed   By: Dorise Bullion III M.D   On: 02/11/2019 13:57   Ct Chest W Contrast  Result Date: 02/13/2019 CLINICAL DATA:  Diffuse B-cell lymphoma. Evaluate treatment response. No current complaints. EXAM: CT CHEST, ABDOMEN, AND PELVIS WITH CONTRAST TECHNIQUE: Multidetector CT imaging of the chest, abdomen and pelvis was performed following the standard protocol during bolus administration of intravenous contrast. CONTRAST:  139m OMNIPAQUE IOHEXOL 300 MG/ML  SOLN COMPARISON:  06/28/2018 PET. FINDINGS: CT CHEST FINDINGS Cardiovascular: Right Port-A-Cath tip  at high right atrium. Normal caliber of the aorta and branch vessels. borderline cardiomegaly, without pericardial effusion. No central pulmonary embolism, on this non-dedicated study. Mediastinum/Nodes: No supraclavicular adenopathy. No axillary adenopathy. No mediastinal or hilar adenopathy. No internal mammary adenopathy. Lungs/Pleura: No pleural fluid.  Clear lungs. Musculoskeletal: No acute osseous abnormality. CT ABDOMEN PELVIS FINDINGS Hepatobiliary: Hepatomegaly at 19.5 cm craniocaudal. No focal liver lesion. Normal gallbladder, without biliary ductal dilatation. Pancreas: Normal, without mass or ductal dilatation. Spleen: Normal in size, without focal abnormality. Adrenals/Urinary Tract: Normal adrenal glands. Bilateral low-density renal lesions, most consistent with cysts and minimally complex cysts. No hydronephrosis. Normal urinary bladder. Stomach/Bowel: Normal stomach, without wall thickening. Scattered colonic diverticula. Normal terminal ileum and appendix. Normal small bowel.  Vascular/Lymphatic: Aortic and branch vessel atherosclerosis. Index left inguinal node measures 1.4 x 2.2 cm on 119/2 versus 1.8 x 2.7 cm on the prior exam. Left external iliac node measures 1.0 cm on 110/2 versus 2.0 cm on the prior. Reproductive: Mild prostatomegaly. Other: No significant free fluid. Tiny periumbilical fat containing ventral abdominal wall hernia. Musculoskeletal: Pelvic cortical thickening and trabeculation, similar. Lumbosacral spondylosis. IMPRESSION: 1. Response to therapy of pelvic adenopathy compared to the PET of 06/28/2018. 2. No new or progressive disease. 3. Mild prostatomegaly. 4. 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 5. Hepatomegaly. Electronically Signed   By: Abigail Miyamoto M.D.   On: 02/13/2019 11:06   Ct Abdomen Pelvis W Contrast  Result Date: 02/13/2019 CLINICAL DATA:  Diffuse B-cell lymphoma. Evaluate treatment response. No current complaints. EXAM: CT CHEST, ABDOMEN, AND PELVIS WITH CONTRAST TECHNIQUE: Multidetector CT imaging of the chest, abdomen and pelvis was performed following the standard protocol during bolus administration of intravenous contrast. CONTRAST:  169m OMNIPAQUE IOHEXOL 300 MG/ML  SOLN COMPARISON:  06/28/2018 PET. FINDINGS: CT CHEST FINDINGS Cardiovascular: Right Port-A-Cath tip at high right atrium. Normal caliber of the aorta and branch vessels. borderline cardiomegaly, without pericardial effusion. No central pulmonary embolism, on this non-dedicated study. Mediastinum/Nodes: No supraclavicular adenopathy. No axillary adenopathy. No mediastinal or hilar adenopathy. No internal mammary adenopathy. Lungs/Pleura: No pleural fluid.  Clear lungs. Musculoskeletal: No acute osseous abnormality. CT ABDOMEN PELVIS FINDINGS Hepatobiliary: Hepatomegaly at 19.5 cm craniocaudal. No focal liver lesion. Normal gallbladder, without biliary ductal dilatation. Pancreas: Normal, without mass or ductal dilatation.  Spleen: Normal in size, without focal abnormality. Adrenals/Urinary Tract: Normal adrenal glands. Bilateral low-density renal lesions, most consistent with cysts and minimally complex cysts. No hydronephrosis. Normal urinary bladder. Stomach/Bowel: Normal stomach, without wall thickening. Scattered colonic diverticula. Normal terminal ileum and appendix. Normal small bowel. Vascular/Lymphatic: Aortic and branch vessel atherosclerosis. Index left inguinal node measures 1.4 x 2.2 cm on 119/2 versus 1.8 x 2.7 cm on the prior exam. Left external iliac node measures 1.0 cm on 110/2 versus 2.0 cm on the prior. Reproductive: Mild prostatomegaly. Other: No significant free fluid. Tiny periumbilical fat containing ventral abdominal wall hernia. Musculoskeletal: Pelvic cortical thickening and trabeculation, similar. Lumbosacral spondylosis. IMPRESSION: 1. Response to therapy of pelvic adenopathy compared to the PET of 06/28/2018. 2. No new or progressive disease. 3. Mild prostatomegaly. 4. 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 5. Hepatomegaly. Electronically Signed   By: KAbigail MiyamotoM.D.   On: 02/13/2019 11:06    ASSESSMENT & PLAN:   This is a pleasant 63y.o. 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  CT of the abdomen and pelvis performed on 12/22/2017 showed bulky left inguinal, left hemipelvic, and retroperitoneal adenopathy.   01/02/18 Left inguinal LN Biopsy revealed T-Cell/histocyte rich Large B-Cell Lymphoma  12/27/17 ECHO revealed LV EF of 55-60%   01/05/18 PET/CT revealedMassively enlarged pelvic lymph nodes intense metabolic activity consistent lymphoma. 2. Additional  hypermetabolic lymph nodes in the porta hepatis and retroperitoneum LEFT aorta. 3. Solitary hypermetabolic mediastinal lymph node in the upper LEFT Mediastinum. 4. 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 5. Normal spleen.  HIV non reactive on 12/22/2017.Hep C and hep B serology negative.  03/14/18 PET/CT revealedPET-CT findings suggest an excellent response to chemotherapy. The abdominal lymphadenopathy has near completely resolved and demonstrates a near complete metabolic response. The pelvic and inguinal adenopathy has significantly decreased in size and the metabolic activity has significantly decreased. 2. Diffuse marrow activity likely due to chemotherapy and or marrow stimulating drugs. I do not see any discrete persistent lesions.   04/17/18 CT Head revealedSubtle mesial caudothalamic hypodensities may be artifact though, the could reflect encephalitis or Wernicke's encephalopathy. Consider MRI of the head with and without contrast. 2. Mild chronic small vessel ischemic changes  S/p 6 cycles of EPOCH-R completed on 06/01/18  06/28/18 PET/CT revealed Continued good response to treatment. No residual measurable or hypermetabolic abdominal lymphadenopathy and no recurrent osseous disease. 2. Interval decrease in size of the left operator region lymph node and also the left inguinal lymph node. However, the both have small foci of slightly increased hypermetabolism which bears surveillance. 3. No new or progressive lymphadenopathy in the neck, chest, abdomen or pelvis.  S/p 39.6 Gy in 22 fractions between 07/25/18 and 08/23/18  2) left lower extremity swelling- now resolved Doppler ultrasound for DVT was negative in the left lower extremity.  Likely from venous compression +/- lymphatic obstruction from bulky left inguinal, left hemipelvic, and retroperitoneal adenopathy.   3) S/p Port a cath placement   4)h/oE.coli UTI -  Pansensitive -Resolved.Also appearedto have BPH like symptomatology. -Was hospitalized 04/16/18 to 04/20/18 for E.coli sepsis from UTI, treated with antibiotics.  5)s/pE.COLi sepsis - likely from urinary source. Recent h/o E.coli UTI  6) s/p Symptomatic Anemia Hgb 6.7 - due to chemotherapy/sepsis- s/p PRBC transfusion. No overt evidence of bleeding. hgb now stable at 10.6  7) HTN-was elevated in setting of improved po intake and steroids -improving control -conitnueon lisinopril 20 mg p.o. daily today -Continue Flomax for his BPH. -increasedamlodipine to 10 mgTo optimize BP control -will monitor and optimize rx accordingly.  8) Likely BPH - on flomax  PLAN: -Discussed pt labwork today, 02/13/19; blood counts are nml, blood chemistries look good.  -Discussed 02/13/2019 LDH has normalized over the last 3 months -Discussed 02/13/2019 CT C/A/P ([id number]) ([id number]) which revealed "1. Response to therapy of pelvic adenopathy compared to the PET of 06/28/2018. 2. No new or progressive disease. 3. Mild prostatomegaly. 4. 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 5. Hepatomegaly." -No overtly concerning findings on CT Scans -No clinical or laboratory evidence of new or progressive Lymphoma -Recommended that the pt continue to eat well, drink at least 48-64 oz of water each day, and walk 6,000 steps each day.  -Advised pt get Shingrix vaccine in 4-6 months -Will get port removed in 1 week -Will give Prevnar today, Pneumovax in 3 months -Will give flu vaccine -Follow up in  3 months with labs   FOLLOW UP: Flu shot and Prevnar today IR for port a cath removal in 1 week RTC with Dr Irene Limbo with labs in 3 months with injection appointment for pneumovax    The total time spent in the appt was 25 minutes and more than 50% was on counseling and direct patient cares.  All of the patient's questions were answered with apparent  satisfaction. The patient knows to call the clinic with any problems, questions or concerns.   Sullivan Lone MD [place] AAHIVMS [facility] Hematology/Oncology [facility]  (Office):       [phone] (Work cell):  [phone] (Fax):           [phone]  I, Yevette Edwards, am acting as a scribe for Dr. Sullivan Lone.   .I have reviewed the above documentation for accuracy and completeness, and I agree with the above. Brunetta Genera MD

## 2019-02-20 ENCOUNTER — Other Ambulatory Visit: Payer: Self-pay

## 2019-02-20 ENCOUNTER — Inpatient Hospital Stay: Payer: 59 | Admitting: Hematology

## 2019-02-20 VITALS — BP 132/85 | HR 77 | Temp 99.1°F | Resp 18 | Ht 72.0 in | Wt 290.8 lb

## 2019-02-20 DIAGNOSIS — C8338 Diffuse large B-cell lymphoma, lymph nodes of multiple sites: Secondary | ICD-10-CM | POA: Diagnosis not present

## 2019-02-20 DIAGNOSIS — C8330 Diffuse large B-cell lymphoma, unspecified site: Secondary | ICD-10-CM

## 2019-02-20 DIAGNOSIS — Z23 Encounter for immunization: Secondary | ICD-10-CM | POA: Diagnosis not present

## 2019-02-20 MED ORDER — INFLUENZA VAC SPLIT QUAD 0.5 ML IM SUSY
0.5000 mL | PREFILLED_SYRINGE | Freq: Once | INTRAMUSCULAR | Status: AC
  Administered 2019-02-20: 0.5 mL via INTRAMUSCULAR

## 2019-02-20 MED ORDER — PNEUMOCOCCAL 13-VAL CONJ VACC IM SUSP
0.5000 mL | Freq: Once | INTRAMUSCULAR | Status: AC
  Administered 2019-02-20: 0.5 mL via INTRAMUSCULAR
  Filled 2019-02-20: qty 0.5

## 2019-02-20 MED ORDER — INFLUENZA VAC A&B SA ADJ QUAD 0.5 ML IM PRSY: 0.5000 mL | PREFILLED_SYRINGE | Freq: Once | INTRAMUSCULAR | Status: DC

## 2019-02-20 MED ORDER — INFLUENZA VAC SPLIT QUAD 0.5 ML IM SUSY
PREFILLED_SYRINGE | INTRAMUSCULAR | Status: AC
  Filled 2019-02-20: qty 0.5

## 2019-03-07 ENCOUNTER — Other Ambulatory Visit: Payer: Self-pay | Admitting: Radiology

## 2019-03-08 ENCOUNTER — Other Ambulatory Visit: Payer: Self-pay | Admitting: Radiology

## 2019-03-11 ENCOUNTER — Ambulatory Visit (HOSPITAL_COMMUNITY)
Admission: RE | Admit: 2019-03-11 | Discharge: 2019-03-11 | Disposition: A | Discharge status: Home / Self Care | Payer: 59 | Source: Ambulatory Visit | Attending: Hematology | Admitting: Hematology

## 2019-03-11 ENCOUNTER — Encounter (HOSPITAL_COMMUNITY): Payer: Self-pay

## 2019-03-11 ENCOUNTER — Other Ambulatory Visit: Payer: Self-pay

## 2019-03-11 DIAGNOSIS — R7303 Prediabetes: Secondary | ICD-10-CM | POA: Diagnosis not present

## 2019-03-11 DIAGNOSIS — G2581 Restless legs syndrome: Secondary | ICD-10-CM | POA: Diagnosis not present

## 2019-03-11 DIAGNOSIS — C8330 Diffuse large B-cell lymphoma, unspecified site: Secondary | ICD-10-CM | POA: Diagnosis not present

## 2019-03-11 DIAGNOSIS — Z7982 Long term (current) use of aspirin: Secondary | ICD-10-CM | POA: Diagnosis not present

## 2019-03-11 DIAGNOSIS — Z9221 Personal history of antineoplastic chemotherapy: Secondary | ICD-10-CM | POA: Insufficient documentation

## 2019-03-11 DIAGNOSIS — E785 Hyperlipidemia, unspecified: Secondary | ICD-10-CM | POA: Diagnosis not present

## 2019-03-11 DIAGNOSIS — I739 Peripheral vascular disease, unspecified: Secondary | ICD-10-CM | POA: Diagnosis not present

## 2019-03-11 DIAGNOSIS — Z923 Personal history of irradiation: Secondary | ICD-10-CM | POA: Diagnosis not present

## 2019-03-11 DIAGNOSIS — I10 Essential (primary) hypertension: Secondary | ICD-10-CM | POA: Diagnosis not present

## 2019-03-11 DIAGNOSIS — Z79899 Other long term (current) drug therapy: Secondary | ICD-10-CM | POA: Diagnosis not present

## 2019-03-11 DIAGNOSIS — Z452 Encounter for adjustment and management of vascular access device: Secondary | ICD-10-CM | POA: Diagnosis present

## 2019-03-11 HISTORY — PX: IR REMOVAL TUN ACCESS W/ PORT W/O FL MOD SED: IMG2290

## 2019-03-11 LAB — CBC WITH DIFFERENTIAL/PLATELET
Abs Immature Granulocytes: 0.06 10*3/uL (ref 0.00–0.07)
Basophils Absolute: 0 10*3/uL (ref 0.0–0.1)
Basophils Relative: 1 %
Eosinophils Absolute: 0.2 10*3/uL (ref 0.0–0.5)
Eosinophils Relative: 3 %
HCT: 38.6 % — ABNORMAL LOW (ref 39.0–52.0)
Hemoglobin: 12.3 g/dL — ABNORMAL LOW (ref 13.0–17.0)
Immature Granulocytes: 1 %
Lymphocytes Relative: 32 %
Lymphs Abs: 1.9 10*3/uL (ref 0.7–4.0)
MCH: 30.8 pg (ref 26.0–34.0)
MCHC: 31.9 g/dL (ref 30.0–36.0)
MCV: 96.7 fL (ref 80.0–100.0)
Monocytes Absolute: 0.5 10*3/uL (ref 0.1–1.0)
Monocytes Relative: 8 %
Neutro Abs: 3.2 10*3/uL (ref 1.7–7.7)
Neutrophils Relative %: 55 %
Platelets: 271 10*3/uL (ref 150–400)
RBC: 3.99 MIL/uL — ABNORMAL LOW (ref 4.22–5.81)
RDW: 13.6 % (ref 11.5–15.5)
WBC: 5.8 10*3/uL (ref 4.0–10.5)
nRBC: 0 % (ref 0.0–0.2)

## 2019-03-11 LAB — PROTIME-INR
INR: 0.9 (ref 0.8–1.2)
Prothrombin Time: 11.5 seconds (ref 11.4–15.2)

## 2019-03-11 MED ORDER — LIDOCAINE-EPINEPHRINE 1 %-1:100000 IJ SOLN
INTRAMUSCULAR | Status: AC
  Filled 2019-03-11: qty 1

## 2019-03-11 MED ORDER — MIDAZOLAM HCL 2 MG/2ML IJ SOLN
INTRAMUSCULAR | Status: AC
  Filled 2019-03-11: qty 2

## 2019-03-11 MED ORDER — LIDOCAINE-EPINEPHRINE 1 %-1:100000 IJ SOLN
INTRAMUSCULAR | Status: AC | PRN
  Administered 2019-03-11: 10 mL

## 2019-03-11 MED ORDER — CEFAZOLIN SODIUM-DEXTROSE 2-4 GM/100ML-% IV SOLN
2.0000 g | INTRAVENOUS | Status: AC
  Administered 2019-03-11: 14:00:00 2 g via INTRAVENOUS

## 2019-03-11 MED ORDER — FENTANYL CITRATE (PF) 100 MCG/2ML IJ SOLN
INTRAMUSCULAR | Status: AC
  Filled 2019-03-11: qty 2

## 2019-03-11 MED ORDER — MIDAZOLAM HCL 2 MG/2ML IJ SOLN
INTRAMUSCULAR | Status: AC | PRN
  Administered 2019-03-11 (×2): 1 mg via INTRAVENOUS

## 2019-03-11 MED ORDER — CEFAZOLIN SODIUM-DEXTROSE 2-4 GM/100ML-% IV SOLN
INTRAVENOUS | Status: AC
  Administered 2019-03-11: 2 g via INTRAVENOUS
  Filled 2019-03-11: qty 100

## 2019-03-11 MED ORDER — SODIUM CHLORIDE 0.9 % IV SOLN
INTRAVENOUS | Status: DC
  Administered 2019-03-11: 12:00:00 via INTRAVENOUS

## 2019-03-11 MED ORDER — FENTANYL CITRATE (PF) 100 MCG/2ML IJ SOLN
INTRAMUSCULAR | Status: AC | PRN
  Administered 2019-03-11 (×2): 50 ug via INTRAVENOUS

## 2019-03-11 NOTE — H&P (Signed)
Referring Physician(s): Brunetta Genera  Supervising Physician: Sandi Mariscal  Patient Status:  [facility]  Chief Complaint:  "I'm getting my port out"  Subjective: Patient familiar to IR service from left inguinal lymph node biopsy in 2019 and Port-A-Cath placement in 2019.  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  He has no new or progressive disease on recent imaging and presents today for Port-A-Cath removal.  He currently denies fever, headache, chest pain, dyspnea, abdominal/back pain, nausea, vomiting or bleeding.  He continues to smoke and has occasional cough.  Past Medical History:  Diagnosis Date  . Allergy   . Anemia    during chemo  . Arthritis    knee   . Blood transfusion without reported diagnosis   . Cancer ([place])    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- last radiation 06-2018  . Cataract    removed both eyes with l;ens implants   . Family history of colon cancer    in his brother- dx'd age 68   . History of chemotherapy    last 06-01-2018  . History of kidney stones   . History of radiation therapy    last radiation 06-2018  . Hyperlipidemia    currently under control  . Hypertension   . Irregular heart beats   . Lymphadenopathy   . Pain, lower leg    Bilateral  . Peripheral arterial disease ([place])   . Pre-diabetes   . Red-green color blindness   . RLS (restless legs syndrome)   . Snores   . Wears glasses    Past Surgical History:  Procedure Laterality Date  . CATARACT EXTRACTION W/ INTRAOCULAR LENS  IMPLANT, BILATERAL    . COLONOSCOPY    . dislodged salava stone    . FRACTURE SURGERY    . HAND ARTHROPLASTY  1995   crushed left hand  . INGUINAL LYMPH NODE BIOPSY Left 01/02/2018   Procedure: LEFT INGUINAL LYMPH NODE BIOPSY;  Surgeon: Rolm Bookbinder, MD;  Location: [facility];  Service: General;  Laterality: Left;  . IR IMAGING GUIDED PORT  INSERTION  01/15/2018  . MICROLARYNGOSCOPY Left 01/17/2014   Procedure: MICROLARYNGOSCOPY WITH EXCISION OF THE BIOPSY OF LEFT VOCAL CORD LESION;  Surgeon: Izora Gala, MD;  Location: [facility];  Service: ENT;  Laterality: Left;  . ORIF FOOT FRACTURE  2005   left  . REFRACTIVE SURGERY Right    removed cloudiness in right eye after cataract removal       Allergies: Bee venom  Medications: Prior to Admission medications   Medication Sig Start Date End Date Taking? Authorizing Provider  amLODipine (NORVASC) 10 MG tablet Take 1 tablet (10 mg total) by mouth daily. 12/14/18  Yes Wendie Agreste, MD  aspirin 81 MG tablet Take 81 mg by mouth daily.   Yes [provider]  B-COMPLEX-C PO Take by mouth.   Yes [provider]  benzonatate (TESSALON PERLES) 100 MG capsule Take 1 capsule (100 mg total) by mouth 3 (three) times daily as needed. 02/03/19  Yes Hawks, Christy A, FNP  fluticasone (FLONASE) 50 MCG/ACT nasal spray Place 1 spray into both nostrils daily. 02/11/19  Yes Hall-Potvin, [place], PA-C  glucosamine-chondroitin 500-400 MG tablet Take 1 tablet by mouth 3 (three) times daily.   Yes [provider]  lisinopril (ZESTRIL) 20 MG tablet Take 1 tablet (20 mg total) by mouth  daily. 12/14/18  Yes Wendie Agreste, MD  loratadine (CLARITIN) 10 MG tablet Take 1 tablet (10 mg total) by mouth daily. 02/11/19  Yes Hall-Potvin, [place], PA-C  Multiple Vitamins-Minerals (CENTRUM SILVER 50+MEN PO) Take by mouth.   Yes [provider]  naproxen (NAPROSYN) 500 MG tablet Take 1 tablet (500 mg total) by mouth 2 (two) times daily with a meal. As needed for knee pain. 10/17/18  Yes Wendie Agreste, MD  tadalafil (CIALIS) 20 MG tablet  11/29/18  Yes [provider]  albuterol (VENTOLIN HFA) 108 (90 Base) MCG/ACT inhaler Inhale 2 puffs into the lungs every 4 (four) hours as needed for wheezing or shortness of breath. 02/11/19   Hall-Potvin, [place], PA-C   predniSONE (DELTASONE) 20 MG tablet Take 1 tablet (20 mg total) by mouth daily. 02/11/19   Hall-Potvin, [place], PA-C     Vital Signs: Blood pressure 154/91, heart rate 67, respirations 18, temp 98.1, O2 sat 100% room air   Physical Exam awake, alert.  Chest clear to auscultation bilaterally.  Heart with regular rate and rhythm.  Abdomen protuberant, positive bowel sounds, nontender.  Bilateral lower extremity edema noted.  Imaging: No results found.  Labs:  CBC: Recent Labs    09/12/18 1350 11/21/18 1140 02/13/19 0803 03/11/19 1239  WBC 5.6 6.6 8.1 5.8  HGB 11.8* 11.9* 12.7* 12.3*  HCT 35.5* 35.6* 38.7* 38.6*  PLT 246 225 269 271    COAGS: Recent Labs    04/17/18 0600 03/11/19 1239  INR 1.35 0.9    BMP: Recent Labs    08/15/18 1206 09/12/18 1350 11/21/18 1140 02/13/19 0803  NA 141 145 143 143  K 3.9 4.1 3.9 3.6  CL 108 107 107 107  CO2 25 27 26 25   GLUCOSE 87 79 99 127*  BUN 19 20 15 17   CALCIUM 8.9 8.9 8.9 9.5  CREATININE 1.12 1.00 0.93 1.02  GFRNONAA >60 >60 >60 >60  GFRAA >60 >60 >60 >60    LIVER FUNCTION TESTS: Recent Labs    08/15/18 1206 09/12/18 1350 11/21/18 1140 02/13/19 0803  BILITOT 0.5 0.6 0.9 0.4  AST 19 22 17  13*  ALT 14 14 14 11   ALKPHOS 62 58 55 65  PROT 6.6 6.7 6.6 7.0  ALBUMIN 4.0 3.9 3.9 4.2    Assessment and Plan: Pt with history of 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  He has no new or progressive disease on recent imaging and presents today for Port-A-Cath removal.  Details/risks of procedure, including but not limited to, internal bleeding, infection, injury to adjacent structures discussed with patient with his understanding and consent.   Electronically Signed: D. Rowe Robert, PA-C 03/11/2019, 1:27 PM   I spent a total of 20 minutes at the the patient's bedside AND on the patient's hospital floor or unit, greater than 50% of which was counseling/coordinating care for  Port-A-Cath removal

## 2019-03-11 NOTE — Procedures (Signed)
Pre Procedural Dx: Poor venous access Post Procedural Dx: Same  Successful removal of anterior chest wall port-a-cath.  EBL: Minimal  No immediate post procedural complications.   Ronny Bacon, MD Pager #: [phone]

## 2019-03-11 NOTE — Sedation Documentation (Signed)
Patient is resting comfortably, eyes closed, in NAD.

## 2019-03-11 NOTE — Discharge Instructions (Addendum)
For any questions or concerns call [phone]; for after hours call [phone] and ask for on call MD  Implanted Port Removal, Care After This sheet gives you information about how to care for yourself after your procedure. Your health care provider may also give you more specific instructions. If you have problems or questions, contact your health care provider. What can I expect after the procedure? After the procedure, it is common to have:  Soreness or pain near your incision.  Some swelling or bruising near your incision. Follow these instructions at home: Medicines  Take over-the-counter and prescription medicines only as told by your health care provider.  If you were prescribed an antibiotic medicine, take it as told by your health care provider. Do not stop taking the antibiotic even if you start to feel better. Bathing  Do not take baths, swim, or use a hot tub until your health care provider approves. Ask your health care provider if you can take showers. You may only be allowed to take sponge baths. Incision care   Follow instructions from your health care provider about how to take care of your incision. Make sure you: ? Wash your hands with soap and water before you change your bandage (dressing). If soap and water are not available, use hand sanitizer. ? Change your dressing as told by your health care provider. ? Keep your dressing dry. ? Leave stitches (sutures), skin glue, or adhesive strips in place. These skin closures may need to stay in place for 2 weeks or longer. If adhesive strip edges start to loosen and curl up, you may trim the loose edges. Do not remove adhesive strips completely unless your health care provider tells you to do that.  Check your incision area every day for signs of infection. Check for: ? More redness, swelling, or pain. ? More fluid or blood. ? Warmth. ? Pus or a bad smell. Driving   Do not drive for 24 hours if you were given a  medicine to help you relax (sedative) during your procedure.  If you did not receive a sedative, ask your health care provider when it is safe to drive. Activity  Return to your normal activities as told by your health care provider. Ask your health care provider what activities are safe for you.  Do not lift anything that is heavier than 10 lb (4.5 kg), or the limit that you are told, until your health care provider says that it is safe.  Do not do activities that involve lifting your arms over your head. General instructions  Do not use any products that contain nicotine or tobacco, such as cigarettes and e-cigarettes. These can delay healing. If you need help quitting, ask your health care provider.  Keep all follow-up visits as told by your health care provider. This is important. Contact a health care provider if:  You have more redness, swelling, or pain around your incision.  You have more fluid or blood coming from your incision.  Your incision feels warm to the touch.  You have pus or a bad smell coming from your incision.  You have pain that is not relieved by your pain medicine. Get help right away if you have:  A fever or chills.  Chest pain.  Difficulty breathing. Summary  After the procedure, it is common to have pain, soreness, swelling, or bruising near your incision.  If you were prescribed an antibiotic medicine, take it as told by your health care provider. Do  not stop taking the antibiotic even if you start to feel better.  Do not drive for 24 hours if you were given a sedative during your procedure.  Return to your normal activities as told by your health care provider. Ask your health care provider what activities are safe for you. This information is not intended to replace advice given to you by your health care provider. Make sure you discuss any questions you have with your health care provider. Document Released: 04/06/2015 Document Revised:  06/08/2017 Document Reviewed: 06/08/2017 Elsevier Patient Education  [place]. Moderate Conscious Sedation, Adult, Care After These instructions provide you with information about caring for yourself after your procedure. Your health care provider may also give you more specific instructions. Your treatment has been planned according to current medical practices, but problems sometimes occur. Call your health care provider if you have any problems or questions after your procedure. What can I expect after the procedure? After your procedure, it is common:  To feel sleepy for several hours.  To feel clumsy and have poor balance for several hours.  To have poor judgment for several hours.  To vomit if you eat too soon. Follow these instructions at home: For at least 24 hours after the procedure:   Do not: ? Participate in activities where you could fall or become injured. ? Drive. ? Use heavy machinery. ? Drink alcohol. ? Take sleeping pills or medicines that cause drowsiness. ? Make important decisions or sign legal documents. ? Take care of children on your own.  Rest. Eating and drinking  Follow the diet recommended by your health care provider.  If you vomit: ? Drink water, juice, or soup when you can drink without vomiting. ? Make sure you have little or no nausea before eating solid foods. General instructions  Have a responsible adult stay with you until you are awake and alert.  Take over-the-counter and prescription medicines only as told by your health care provider.  If you smoke, do not smoke without supervision.  Keep all follow-up visits as told by your health care provider. This is important. Contact a health care provider if:  You keep feeling nauseous or you keep vomiting.  You feel light-headed.  You develop a rash.  You have a fever. Get help right away if:  You have trouble breathing. This information is not intended to replace  advice given to you by your health care provider. Make sure you discuss any questions you have with your health care provider. Document Released: 02/13/2013 Document Revised: 04/07/2017 Document Reviewed: 08/15/2015 Elsevier Patient Education  2020 [employer].

## 2019-03-18 ENCOUNTER — Encounter: Payer: Self-pay | Admitting: Hematology

## 2019-03-18 ENCOUNTER — Encounter: Payer: Self-pay | Admitting: Family Medicine

## 2019-04-08 ENCOUNTER — Encounter: Payer: Self-pay | Admitting: Emergency Medicine

## 2019-04-08 ENCOUNTER — Other Ambulatory Visit: Payer: Self-pay

## 2019-04-08 ENCOUNTER — Ambulatory Visit
Admission: EM | Admit: 2019-04-08 | Discharge: 2019-04-08 | Disposition: A | Discharge status: Home / Self Care | Payer: 59

## 2019-04-08 DIAGNOSIS — K130 Diseases of lips: Secondary | ICD-10-CM

## 2019-04-08 NOTE — ED Triage Notes (Signed)
Pt presents to [facility] for assessment of bilateral lip corner cracking x 1-2 weeks.

## 2019-04-08 NOTE — Discharge Instructions (Signed)
Continue using low-dose steroid cream. May use Lotrimin if needed. Avoid products containing lanolin as this can further irritate the skin.

## 2019-04-08 NOTE — ED Provider Notes (Signed)
[facility]    CSN: [id number] Arrival date & time: 04/08/19  1728      History   Chief Complaint Chief Complaint  Patient presents with  . dry lips    HPI Jesus [name] is a 63 y.o. male with history of prediabetes, hypertension, hyperlipidemia, non-Hodgkin's lymphoma in remission, presenting for bilateral cracking of corners of lips for the last 1 to 2 weeks.  Patient has tried hydrocortisone cream with some relief.  Patient feels that this is likely related to wearing his mask all the time.  Pain is worse when opening his mouth.  Denies dental pain, swelling of lips, tongue.   Past Medical History:  Diagnosis Date  . Allergy   . Anemia    during chemo  . Arthritis    knee   . Blood transfusion without reported diagnosis   . Cancer ([place])    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- last radiation 06-2018  . Cataract    removed both eyes with l;ens implants   . Family history of colon cancer    in his brother- dx'd age 88   . History of chemotherapy    last 06-01-2018  . History of kidney stones   . History of radiation therapy    last radiation 06-2018  . Hyperlipidemia    currently under control  . Hypertension   . Irregular heart beats   . Lymphadenopathy   . Pain, lower leg    Bilateral  . Peripheral arterial disease ([place])   . Pre-diabetes   . Red-green color blindness   . RLS (restless legs syndrome)   . Snores   . Wears glasses     Patient Active Problem List   Diagnosis Date Noted  . Chronic pain of right knee 10/17/2018  . Large cell (diffuse) non-Hodgkin's lymphoma ([place]) 05/10/2018  . Bacteremia due to Escherichia coli   . HTN (hypertension) 04/17/2018  . RLS (restless legs syndrome) 04/17/2018  . Abnormal LFTs 04/17/2018  . Acute metabolic encephalopathy 38/46/6599  . AKI (acute kidney injury) ([place]) 04/16/2018  . Dehydration   . Fever, unspecified   . Sepsis ([place])   . Disorientation   .  Hypokalemia   . Hypomagnesemia   . Anemia   . Encounter for antineoplastic chemotherapy   . At high risk of tumor lysis syndrome   . Swelling of lower leg   . Diffuse large B cell lymphoma ([place]) 01/15/2018  . 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[place]) 01/12/2018  . Counseling regarding advance care planning and goals of care 01/12/2018  . Bilateral leg pain 05/27/2014    Past Surgical History:  Procedure Laterality Date  . CATARACT EXTRACTION W/ INTRAOCULAR LENS  IMPLANT, BILATERAL    . COLONOSCOPY    . dislodged salava stone    . FRACTURE SURGERY    . HAND ARTHROPLASTY  1995   crushed left hand  . INGUINAL LYMPH NODE BIOPSY Left 01/02/2018   Procedure: LEFT INGUINAL LYMPH NODE BIOPSY;  Surgeon: Rolm Bookbinder, MD;  Location: [facility];  Service: General;  Laterality: Left;  . IR IMAGING GUIDED PORT INSERTION  01/15/2018  . IR REMOVAL TUN ACCESS W/ PORT W/O FL MOD SED  03/11/2019  . MICROLARYNGOSCOPY Left 01/17/2014   Procedure: MICROLARYNGOSCOPY WITH EXCISION OF THE BIOPSY OF LEFT VOCAL CORD LESION;  Surgeon: Izora Gala, MD;  Location: [facility];  Service: ENT;  Laterality: Left;  .  ORIF FOOT FRACTURE  2005   left  . REFRACTIVE SURGERY Right    removed cloudiness in right eye after cataract removal        Home Medications    Prior to Admission medications   Medication Sig Start Date End Date Taking? Authorizing Provider  loratadine (CLARITIN) 10 MG tablet Take 1 tablet (10 mg total) by mouth daily. 02/11/19  Yes Hall-Potvin, [place], PA-C  tadalafil (CIALIS) 20 MG tablet  11/29/18  Yes [provider]  albuterol (VENTOLIN HFA) 108 (90 Base) MCG/ACT inhaler Inhale 2 puffs into the lungs every 4 (four) hours as needed for wheezing or shortness of breath. 02/11/19   Hall-Potvin, [place], PA-C  amLODipine (NORVASC) 10 MG tablet Take 1 tablet (10 mg total) by mouth daily. 12/14/18   Wendie Agreste, MD  aspirin 81 MG tablet Take  81 mg by mouth daily.    [provider]  B-COMPLEX-C PO Take by mouth.    [provider]  benzonatate (TESSALON PERLES) 100 MG capsule Take 1 capsule (100 mg total) by mouth 3 (three) times daily as needed. 02/03/19   Sharion Balloon, FNP  fluticasone (FLONASE) 50 MCG/ACT nasal spray Place 1 spray into both nostrils daily. 02/11/19   Hall-Potvin, [place], PA-C  glucosamine-chondroitin 500-400 MG tablet Take 1 tablet by mouth 3 (three) times daily.    [provider]  lisinopril (ZESTRIL) 20 MG tablet Take 1 tablet (20 mg total) by mouth daily. 12/14/18   Wendie Agreste, MD  Multiple Vitamins-Minerals (CENTRUM SILVER 50+MEN PO) Take by mouth.    [provider]  naproxen (NAPROSYN) 500 MG tablet Take 1 tablet (500 mg total) by mouth 2 (two) times daily with a meal. As needed for knee pain. 10/17/18   Wendie Agreste, MD    Family History Family History  Problem Relation Age of Onset  . Breast cancer Mother   . Diabetes Father   . Hypertension Father   . Stroke Father   . Mental illness Sister   . Hypertension Daughter   . Mental illness Daughter   . Hypertension Brother   . Colon cancer Brother 40       passed away 2018-12-29  . Breast cancer Sister   . Esophageal cancer Neg Hx   . Colon polyps Neg Hx   . Rectal cancer Neg Hx   . Stomach cancer Neg Hx     Social History Social History   Tobacco Use  . Smoking status: Current Every Day Smoker    Packs/day: 0.50    Years: 36.00    Pack years: 18.00    Types: Cigarettes  . Smokeless tobacco: Never Used  . Tobacco comment: he denies smoking in about 2 weeks 07/13/18  Substance Use Topics  . Alcohol use: Yes    Alcohol/week: 11.0 standard drinks    Types: 5 Shots of liquor, 6 Cans of beer per week    Comment: weekends, he denies current alcohol use 07/13/18  . Drug use: Not Currently    Types: Cocaine    Comment: reports cocaine usage ~2X/ month; last use 12/26/17     Allergies   Bee venom    Review of Systems Review of Systems  Constitutional: Negative for fatigue and fever.  HENT: Negative for dental problem and drooling.   Respiratory: Negative for cough and shortness of breath.   Cardiovascular: Negative for chest pain and palpitations.  Gastrointestinal: Negative for abdominal pain, diarrhea and vomiting.  Musculoskeletal: Negative  for arthralgias and myalgias.  Skin: Negative for rash and wound.  Neurological: Negative for speech difficulty and headaches.  All other systems reviewed and are negative.    Physical Exam Triage Vital Signs ED Triage Vitals [04/08/19 1738]  Enc Vitals Group     BP (!) 161/96     Pulse Rate 98     Resp 18     Temp 99.3 F (37.4 C)     Temp Source Oral     SpO2 95 %     Weight      Height      Head Circumference      Peak Flow      Pain Score 5     Pain Loc      Pain Edu?      Excl. in [place]?    No data found.  Updated Vital Signs BP (!) 161/96 (BP Location: Left Arm)   Pulse 98   Temp 99.3 F (37.4 C) (Oral)   Resp 18   SpO2 95%   Visual Acuity Right Eye Distance:   Left Eye Distance:   Bilateral Distance:    Right Eye Near:   Left Eye Near:    Bilateral Near:     Physical Exam Constitutional:      General: He is not in acute distress.    Appearance: He is obese. He is not ill-appearing.  HENT:     Head: Normocephalic and atraumatic.     Mouth/Throat:     Mouth: Mucous membranes are moist.     Comments: Mild angular colitis noted without surrounding erythema, edema, active discharge. Eyes:     General: No scleral icterus.    Pupils: Pupils are equal, round, and reactive to light.  Cardiovascular:     Rate and Rhythm: Normal rate.  Pulmonary:     Effort: Pulmonary effort is normal.  Skin:    Coloration: Skin is not jaundiced or pale.  Neurological:     Mental Status: He is alert and oriented to person, place, and time.      UC Treatments / Results  Labs (all labs ordered are listed, but only  abnormal results are displayed) Labs Reviewed - No data to display  EKG   Radiology No results found.  Procedures Procedures (including critical care time)  Medications Ordered in UC Medications - No data to display  Initial Impression / Assessment and Plan / UC Course  I have reviewed the triage vital signs and the nursing notes.  Pertinent labs & imaging results that were available during my care of the patient were reviewed by me and considered in my medical decision making (see chart for details).     Patient afebrile, nontoxic.  Low concern for infectious process at this time.  Patient denying symptoms of poor sugar control and is in remission from non-Hodgkin's at this time.  Patient to continue current treatment and is consistent with recommended management.  Return precautions discussed, patient verbalized understanding and is agreeable to plan. Final Clinical Impressions(s) / UC Diagnoses   Final diagnoses:  Angular cheilitis     Discharge Instructions     Continue using low-dose steroid cream. May use Lotrimin if needed. Avoid products containing lanolin as this can further irritate the skin.    ED Prescriptions    None     PDMP not reviewed this encounter.   Hall-Potvin, [place], [place] 04/08/19 1752

## 2019-04-08 NOTE — ED Notes (Signed)
Patient able to ambulate independently  

## 2019-04-17 ENCOUNTER — Telehealth: Payer: 59 | Admitting: Family

## 2019-04-17 DIAGNOSIS — R1030 Lower abdominal pain, unspecified: Secondary | ICD-10-CM

## 2019-04-17 NOTE — Progress Notes (Signed)
Based on what you shared with me, I feel your condition warrants further evaluation and I recommend that you be seen for a face to face office visit.   Given your symptoms of groin pain you need to be seen face to face to be evaluated.    NOTE: If you entered your credit card information for this eVisit, you will not be charged. You may see a "hold" on your card for the $35 but that hold will drop off and you will not have a charge processed.   If you are having a true medical emergency please call 911.      For an urgent face to face visit, [place] has five urgent care centers for your convenience:      NEW:  [facility] [place] [facility] [place] Get Driving Directions [phone] [address] [place], [place] [address] . 10 am - 6pm Monday - Friday    [place] [facility]) Get Driving Directions [phone] [address] [place], [place] [address] . 10 am to 8 pm Monday-Friday . 12 pm to 8 pm [facility] at [facility] Get Driving Directions [phone] [place], [place], [place] [address] . 8 am to 8 pm Monday-Friday . 9 am to 6 pm Saturday . 11 am to 6 pm Sunday     [facility] at [facility] Get Driving Directions  [phone] [address] [place], [place] [address] . 8 am to 8 pm Monday-Friday . 8 am to 4 pm [facility] at [place] Get Driving Directions [phone] [address] [place], [place] [address] . 12 pm to 6 pm Monday-Friday      Your e-visit answers were reviewed by a board certified advanced clinical practitioner to complete your personal care plan.  Thank you for using e-Visits.

## 2019-04-28 ENCOUNTER — Other Ambulatory Visit: Payer: Self-pay

## 2019-04-28 ENCOUNTER — Ambulatory Visit (INDEPENDENT_AMBULATORY_CARE_PROVIDER_SITE_OTHER): Payer: 59

## 2019-04-28 ENCOUNTER — Ambulatory Visit
Admission: EM | Admit: 2019-04-28 | Discharge: 2019-04-28 | Disposition: A | Discharge status: Home / Self Care | Payer: 59 | Attending: Physician Assistant | Admitting: Physician Assistant

## 2019-04-28 DIAGNOSIS — S92512A Displaced fracture of proximal phalanx of left lesser toe(s), initial encounter for closed fracture: Secondary | ICD-10-CM | POA: Diagnosis not present

## 2019-04-28 DIAGNOSIS — W228XXA Striking against or struck by other objects, initial encounter: Secondary | ICD-10-CM

## 2019-04-28 DIAGNOSIS — S92515A Nondisplaced fracture of proximal phalanx of left lesser toe(s), initial encounter for closed fracture: Secondary | ICD-10-CM | POA: Diagnosis not present

## 2019-04-28 IMAGING — DX DG FOOT COMPLETE 3+V*L*
3 series · 3 of 3 positions shown · non-contrast
Comparison: None.

CLINICAL DATA: Fourth digit pain after trauma. Previous fracture of
the third, fourth, and fifth digits.

EXAM:
LEFT FOOT - COMPLETE 3+ VIEW

[foot supine dp]
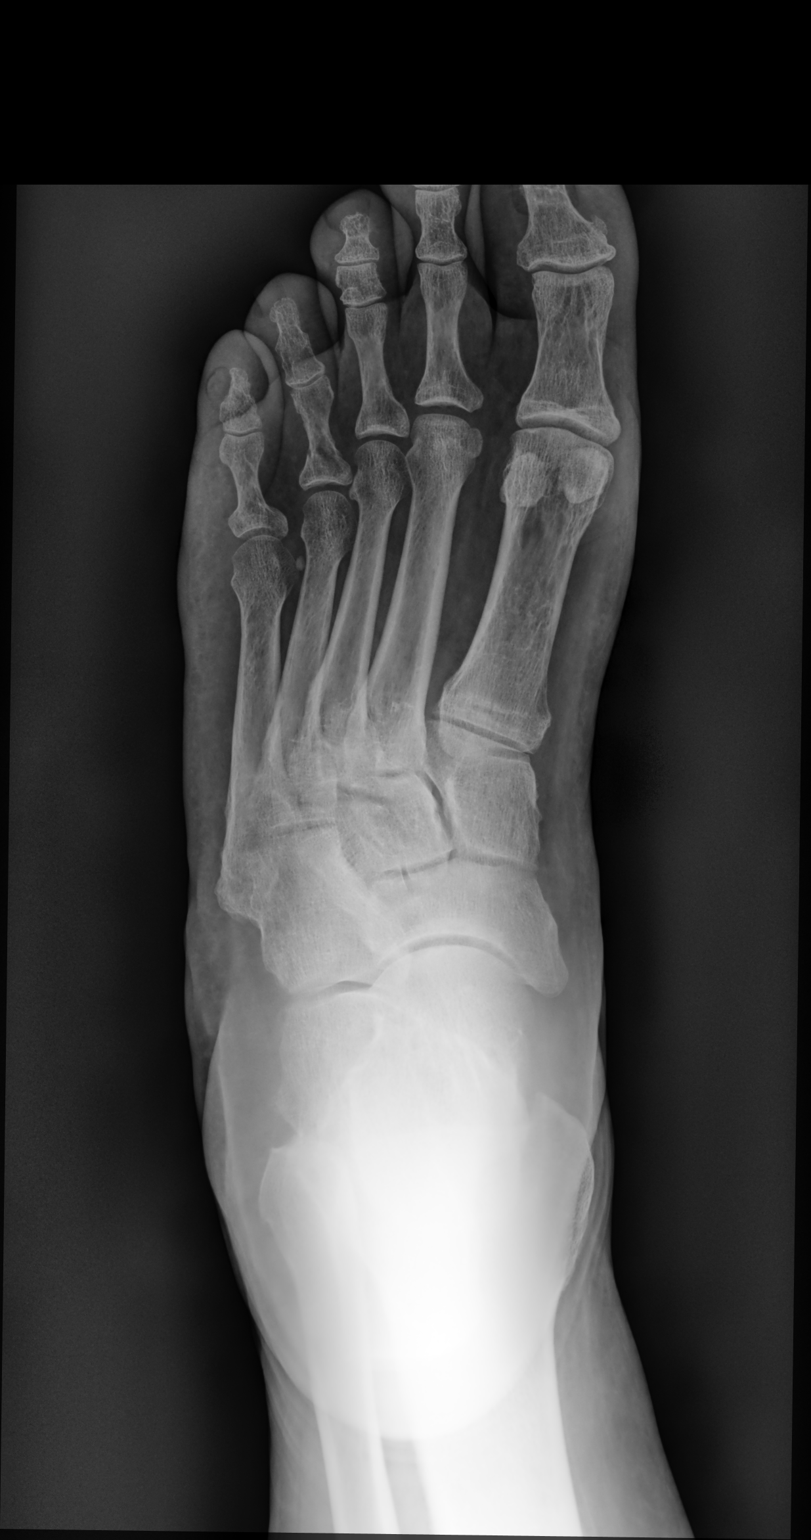

[foot medial oblique]
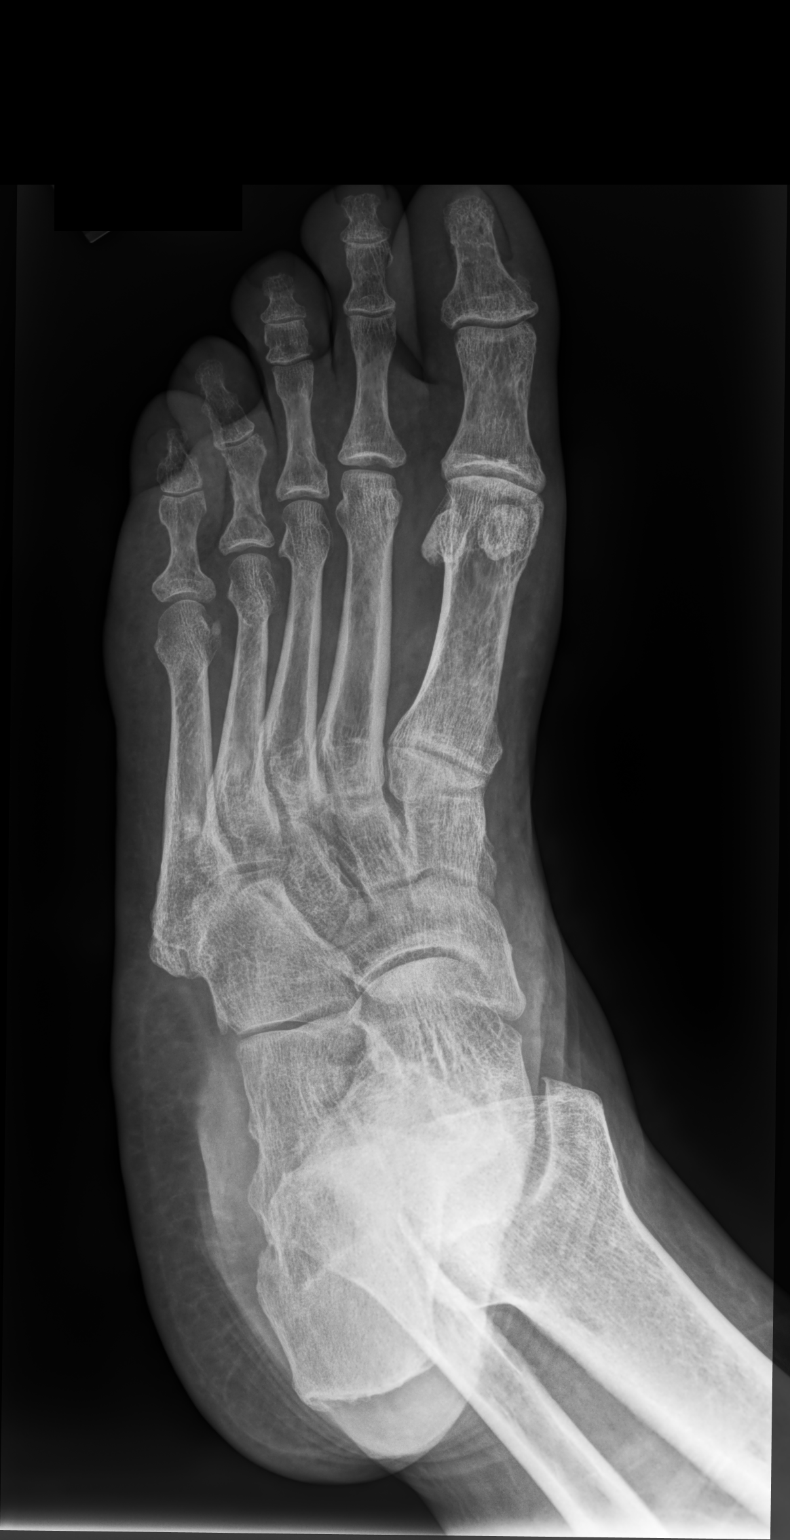

[foot supine lat]
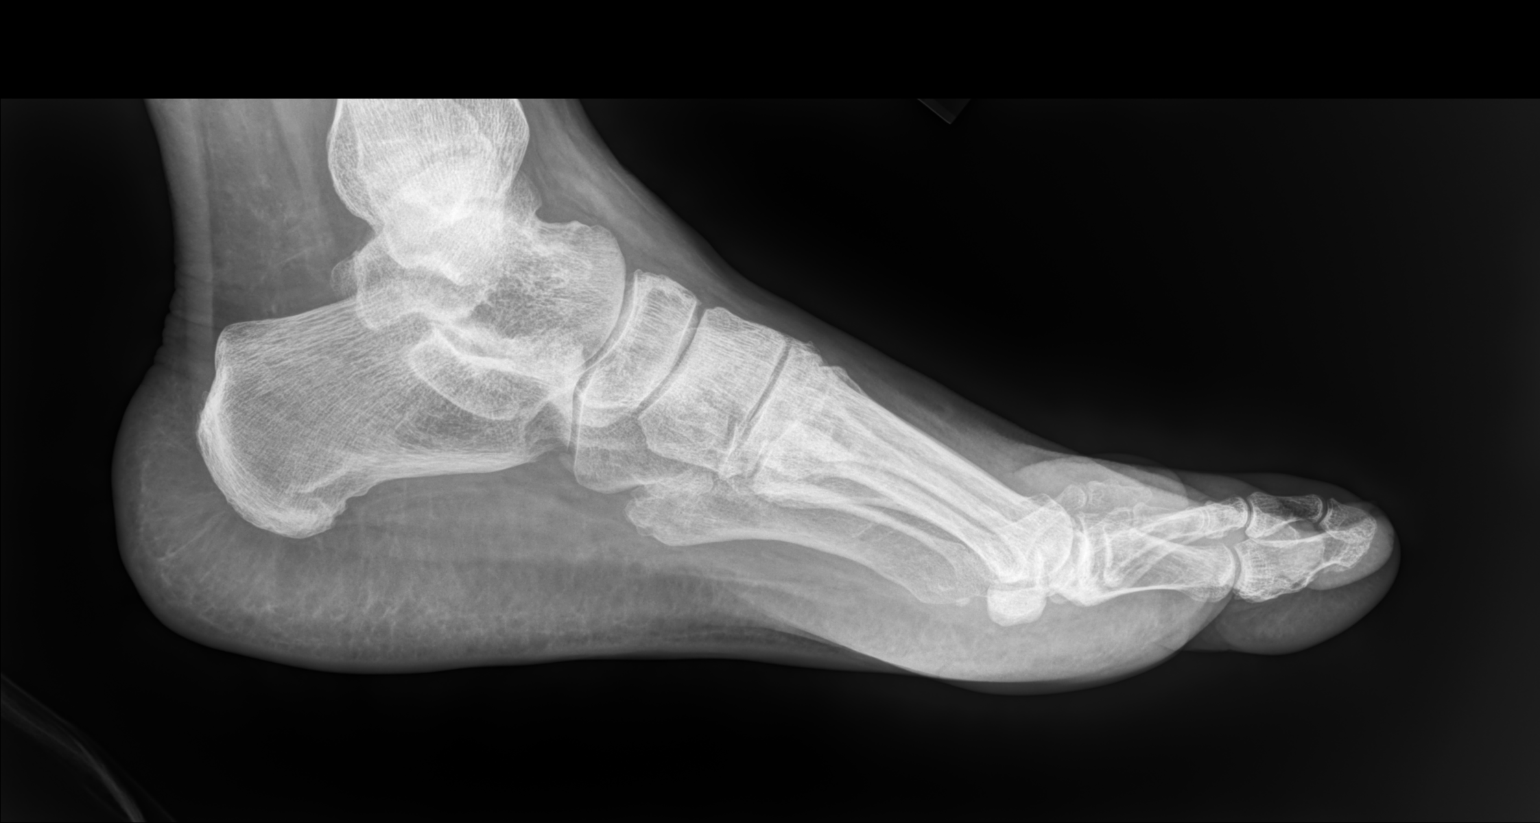

[3 of 3 positions shown; findings below may reference images not displayed]

FINDINGS: There is a fracture through the diaphysis of the proximal fourth
phalanx without significant displacement.
IMPRESSION: Fracture through the proximal fourth phalanx without significant
displacement.

## 2019-04-28 MED ORDER — HYDROCODONE-ACETAMINOPHEN 5-325 MG PO TABS: 1.0000 | ORAL_TABLET | Freq: Four times a day (QID) | ORAL | 0 refills | Status: DC | PRN

## 2019-04-28 NOTE — ED Triage Notes (Signed)
Pt c/o lt 4th toe pain after hitting it on a bar stool at 12p today.

## 2019-04-28 NOTE — ED Provider Notes (Signed)
[facility]    CSN: [id number] Arrival date & time: 04/28/19  1501      History   Chief Complaint Chief Complaint  Patient presents with  . Toe Pain    HPI [name] is a 63 y.o. male.   63 year old male with history of prediabetes, HTN, HLD, non-Hodgkin's lymphoma in remission comes in for left fourth toe pain after injury.  States he stubbed his toe on a barstool.  Has had some swelling.  Has had some throbbing pain that is worse with range of motion and weightbearing.  Toe with numbness and tingling.  Has not tried anything for the symptoms.     Past Medical History:  Diagnosis Date  . Allergy   . Anemia    during chemo  . Arthritis    knee   . Blood transfusion without reported diagnosis   . 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 Cataract    removed both eyes with l;ens implants   . Family history of colon cancer    in his brother- dx'd age 67   . History of chemotherapy    last 06-01-2018  . History of kidney stones   . History of radiation therapy    last radiation 06-2018  . Hyperlipidemia    currently under control  . Hypertension   . Irregular heart beats   . Lymphadenopathy   . Pain, lower leg    Bilateral  . Peripheral arterial disease ([place])   . Pre-diabetes   . Red-green color blindness   . RLS (restless legs syndrome)   . Snores   . Wears glasses     Patient Active Problem List   Diagnosis Date Noted  . Chronic pain of right knee 10/17/2018  . Large cell (diffuse) non-Hodgkin's lymphoma ([place]) 05/10/2018  . Bacteremia due to Escherichia coli   . HTN (hypertension) 04/17/2018  . RLS (restless legs syndrome) 04/17/2018  . Abnormal LFTs 04/17/2018  . Acute metabolic encephalopathy 82/50/5397  . AKI (acute kidney injury) ([place]) 04/16/2018  . Dehydration   . Fever, unspecified   . Sepsis ([place])   . Disorientation   . Hypokalemia   . Hypomagnesemia     . Anemia   . Encounter for antineoplastic chemotherapy   . At high risk of tumor lysis syndrome   . Swelling of lower leg   . Diffuse large B cell lymphoma ([place]) 01/15/2018  . 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[place]) 01/12/2018  . Counseling regarding advance care planning and goals of care 01/12/2018  . Bilateral leg pain 05/27/2014    Past Surgical History:  Procedure Laterality Date  . CATARACT EXTRACTION W/ INTRAOCULAR LENS  IMPLANT, BILATERAL    . COLONOSCOPY    . dislodged salava stone    . FRACTURE SURGERY    . HAND ARTHROPLASTY  1995   crushed left hand  . INGUINAL LYMPH NODE BIOPSY Left 01/02/2018   Procedure: LEFT INGUINAL LYMPH NODE BIOPSY;  Surgeon: Rolm Bookbinder, MD;  Location: [facility];  Service: General;  Laterality: Left;  . IR IMAGING GUIDED PORT INSERTION  01/15/2018  . IR REMOVAL TUN ACCESS W/ PORT W/O FL MOD SED  03/11/2019  . MICROLARYNGOSCOPY Left 01/17/2014   Procedure: MICROLARYNGOSCOPY WITH EXCISION OF THE BIOPSY OF LEFT VOCAL CORD LESION;  Surgeon: Izora Gala, MD;  Location: [facility];  Service: ENT;  Laterality: Left;  . ORIF FOOT FRACTURE  2005   left  . REFRACTIVE SURGERY Right    removed cloudiness in right eye after cataract removal        Home Medications    Prior to Admission medications   Medication Sig Start Date End Date Taking? Authorizing Provider  albuterol (VENTOLIN HFA) 108 (90 Base) MCG/ACT inhaler Inhale 2 puffs into the lungs every 4 (four) hours as needed for wheezing or shortness of breath. 02/11/19   Hall-Potvin, [place], PA-C  amLODipine (NORVASC) 10 MG tablet Take 1 tablet (10 mg total) by mouth daily. 12/14/18   Wendie Agreste, MD  aspirin 81 MG tablet Take 81 mg by mouth daily.    [provider]  B-COMPLEX-C PO Take by mouth.    [provider]  benzonatate (TESSALON PERLES) 100 MG capsule Take 1 capsule (100 mg total) by mouth 3 (three) times daily as needed.  02/03/19   Sharion Balloon, FNP  fluticasone (FLONASE) 50 MCG/ACT nasal spray Place 1 spray into both nostrils daily. 02/11/19   Hall-Potvin, [place], PA-C  glucosamine-chondroitin 500-400 MG tablet Take 1 tablet by mouth 3 (three) times daily.    [provider]  HYDROcodone-acetaminophen (NORCO/VICODIN) 5-325 MG tablet Take 1-2 tablets by mouth every 6 (six) hours as needed for severe pain. 04/28/19   Tasia Catchings, Chakia Counts V, PA-C  lisinopril (ZESTRIL) 20 MG tablet Take 1 tablet (20 mg total) by mouth daily. 12/14/18   Wendie Agreste, MD  loratadine (CLARITIN) 10 MG tablet Take 1 tablet (10 mg total) by mouth daily. 02/11/19   Hall-Potvin, [place], PA-C  Multiple Vitamins-Minerals (CENTRUM SILVER 50+MEN PO) Take by mouth.    [provider]  naproxen (NAPROSYN) 500 MG tablet Take 1 tablet (500 mg total) by mouth 2 (two) times daily with a meal. As needed for knee pain. 10/17/18   Wendie Agreste, MD  tadalafil (CIALIS) 20 MG tablet  11/29/18   [provider]    Family History Family History  Problem Relation Age of Onset  . Breast cancer Mother   . Diabetes Father   . Hypertension Father   . Stroke Father   . Mental illness Sister   . Hypertension Daughter   . Mental illness Daughter   . Hypertension Brother   . Colon cancer Brother 44       passed away 12/04/18  . Breast cancer Sister   . Esophageal cancer Neg Hx   . Colon polyps Neg Hx   . Rectal cancer Neg Hx   . Stomach cancer Neg Hx     Social History Social History   Tobacco Use  . Smoking status: Current Every Day Smoker    Packs/day: 0.50    Years: 36.00    Pack years: 18.00    Types: Cigarettes  . Smokeless tobacco: Never Used  . Tobacco comment: he denies smoking in about 2 weeks 07/13/18  Substance Use Topics  . Alcohol use: Yes    Alcohol/week: 11.0 standard drinks    Types: 5 Shots of liquor, 6 Cans of beer per week    Comment: weekends, he denies current alcohol use 07/13/18  . Drug use: Not  Currently    Types: Cocaine    Comment: reports cocaine usage ~2X/ month; last use 12/26/17     Allergies   Bee venom   Review of Systems Review of Systems  Reason unable to perform ROS: See HPI as above.     Physical  Exam Triage Vital Signs ED Triage Vitals  Enc Vitals Group     BP 04/28/19 1608 (!) 157/80     Pulse Rate 04/28/19 1608 65     Resp 04/28/19 1608 18     Temp 04/28/19 1608 98.2 F (36.8 C)     Temp Source 04/28/19 1608 Oral     SpO2 04/28/19 1608 97 %     Weight --      Height --      Head Circumference --      Peak Flow --      Pain Score 04/28/19 1519 4     Pain Loc --      Pain Edu? --      Excl. in [place]? --    No data found.  Updated Vital Signs BP (!) 157/80 (BP Location: Left Arm)   Pulse 65   Temp 98.2 F (36.8 C) (Oral)   Resp 18   SpO2 97%   Physical Exam Constitutional:      General: He is not in acute distress.    Appearance: He is well-developed. He is not diaphoretic.  HENT:     Head: Normocephalic and atraumatic.  Eyes:     Conjunctiva/sclera: Conjunctivae normal.     Pupils: Pupils are equal, round, and reactive to light.  Pulmonary:     Effort: Pulmonary effort is normal. No respiratory distress.  Musculoskeletal:     Comments: No obvious swelling, erythema, contusion.  Tenderness to palpation along left fourth toe.  No tenderness to palpation of the MTPs.  Full range of motion. NVI  Skin:    General: Skin is warm and dry.  Neurological:     Mental Status: He is alert and oriented to person, place, and time.      UC Treatments / Results  Labs (all labs ordered are listed, but only abnormal results are displayed) Labs Reviewed - No data to display  EKG   Radiology DG Foot Complete Left  Result Date: 04/28/2019 CLINICAL DATA:  Fourth digit pain after trauma. Previous fracture of the third, fourth, and fifth digits. EXAM: LEFT FOOT - COMPLETE 3+ VIEW COMPARISON:  None. FINDINGS: There is a fracture through the  diaphysis of the proximal fourth phalanx without significant displacement. IMPRESSION: Fracture through the proximal fourth phalanx without significant displacement. Electronically Signed   By: Dorise Bullion III M.D   On: 04/28/2019 16:11    Procedures Procedures (including critical care time)  Medications Ordered in UC Medications - No data to display  Initial Impression / Assessment and Plan / UC Course  I have reviewed the triage vital signs and the nursing notes.  Pertinent labs & imaging results that were available during my care of the patient were reviewed by me and considered in my medical decision making (see chart for details).    Discussed x-ray results with patient.  Ice compress, elevation, postop boot during activity.  Patient states has been told by oncologist to monitor NSAID use.  Last BMP was normal creatinine, will have patient cleared with oncology for NSAID use.  Will provide short course of Norco as needed.  Return precautions given.  Patient expresses understanding and agrees to plan.  Final Clinical Impressions(s) / UC Diagnoses   Final diagnoses:  Closed nondisplaced fracture of proximal phalanx of lesser toe of left foot, initial encounter   ED Prescriptions    Medication Sig Dispense Auth. Provider   HYDROcodone-acetaminophen (NORCO/VICODIN) 5-325 MG tablet Take 1-2 tablets by mouth  every 6 (six) hours as needed for severe pain. 10 tablet Ok Edwards, PA-C     I have reviewed the PDMP during this encounter.   Ok Edwards, PA-C 04/28/19 1719

## 2019-04-28 NOTE — Discharge Instructions (Signed)
As discussed, fracture to the fourth left toe.  Ice compress and elevation.  Postop boot during activity.  Norco as needed for pain.  Follow-up with PCP/orthopedic to monitor for healing.  This usually takes about 6 to 8 weeks.

## 2019-05-23 NOTE — Progress Notes (Signed)
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 male is here because of left lower extremity edema and lymphadenopathy.  The patient was seen in the emergency room this past Friday for the same issue.  A CT of the abdomen and pelvis was performed showing bulky left inguinal, left hemipelvic, and retroperitoneal adenopathy.  He was referred to [place] from the emergency room for further evaluation.  Doppler ultrasound of the left lower extremity was performed and was negative for DVT. Patient reports that he has been having left lower extremity edema in his left groin and left leg for approximately 1 month.  He states that the swelling in the left groin started to get better but then worsened.  The left lower extremity edema has slowly worsened over time.  Patient denies having fevers and chills.  He reports that he does have night sweats at times.  He reported having headaches approximate 1 [place]  He thinks his headaches are related to not having his blood pressure medication.  His headaches have now resolved.  He denies visual changes.  The patient denies chest pain, shortness of breath and cough.  No nausea, vomiting, constipation, diarrhea.  Denies abdominal pain.  The patient denies recent weight loss and has actually gained weight recently.  Patient denies epistaxis, bleeding gums, hemoptysis, hematuria, but occasionally, and melena.  He reports increased urinary frequency over the past month but no dysuria.  The patient is here for evaluation and discussion of his recent CT and lab findings.  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 The patient's last visit with [place] was on 02/20/2019. [name].  The pt reports he broke his left foot due to an accident in the home. His foot has been in a cast for three weeks. He wants to begin a workout regimen but has to wait for the cast to be removed. Pt was told that he has arthritis in his right knee.  Pt had chest congestion, contacted his PCP, and was given Albuterol. His congestion cleared up without antibiotics.  Lab results today (05/24/19) of CBC w/diff and CMP is as follows: all values are WNL except for RBC at 3.87, Hgb at 12.2, HCT at 36.0, Glucose at 126, Calcium at 8.6, AST at 14. 05/24/2019 LDH at 139  On review of systems, pt reports right knee swelling and denies fevers, chills, night sweats, abdominal pain, SOB, chest pain, leg swelling, consitpation and any other symptoms.    MEDICAL HISTORY:   Past Medical History:  Diagnosis Date  . Allergy   . Anemia    during chemo  . Arthritis    knee   . Blood transfusion without reported diagnosis   . Cancer ([place])    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- last radiation 06-2018  . Cataract    removed both eyes with l;ens implants   . Family history of colon cancer  in his brother- dx'd age 63   . History of chemotherapy    last 06-01-2018  . History of kidney stones   . History of radiation therapy    last radiation 06-2018  . Hyperlipidemia    currently under control  . Hypertension   . Irregular heart beats   . Lymphadenopathy   . Pain, lower leg    Bilateral  . Peripheral arterial disease ([place])   . Pre-diabetes   . Red-green color blindness   . RLS (restless legs syndrome)   . Snores   . Wears glasses     SURGICAL HISTORY: Past Surgical History:  Procedure Laterality Date  . CATARACT EXTRACTION W/ INTRAOCULAR LENS  IMPLANT,  BILATERAL    . COLONOSCOPY    . dislodged salava stone    . FRACTURE SURGERY    . HAND ARTHROPLASTY  1995   crushed left hand  . INGUINAL LYMPH NODE BIOPSY Left 01/02/2018   Procedure: LEFT INGUINAL LYMPH NODE BIOPSY;  Surgeon: Rolm Bookbinder, MD;  Location: [facility];  Service: General;  Laterality: Left;  . IR IMAGING GUIDED PORT INSERTION  01/15/2018  . IR REMOVAL TUN ACCESS W/ PORT W/O FL MOD SED  03/11/2019  . MICROLARYNGOSCOPY Left 01/17/2014   Procedure: MICROLARYNGOSCOPY WITH EXCISION OF THE BIOPSY OF LEFT VOCAL CORD LESION;  Surgeon: Izora Gala, MD;  Location: [facility];  Service: ENT;  Laterality: Left;  . ORIF FOOT FRACTURE  2005   left  . REFRACTIVE SURGERY Right    removed cloudiness in right eye after cataract removal     SOCIAL HISTORY: Social History   Socioeconomic History  . Marital status: Divorced    Spouse name: Not on file  . Number of children: 3  . Years of education: Not on file  . Highest education level: Not on file  Occupational History  . Not on file  Tobacco Use  . Smoking status: Current Every Day Smoker    Packs/day: 0.50    Years: 36.00    Pack years: 18.00    Types: Cigarettes  . Smokeless tobacco: Never Used  . Tobacco comment: he denies smoking in about 2 weeks 07/13/18  Substance and Sexual Activity  . Alcohol use: Yes    Alcohol/week: 11.0 standard drinks    Types: 5 Shots of liquor, 6 Cans of beer per week    Comment: weekends, he denies current alcohol use 07/13/18  . Drug use: Not Currently    Types: Cocaine    Comment: reports cocaine usage ~2X/ month; last use 12/26/17  . Sexual activity: Not on file  Other Topics Concern  . Not on file  Social History Narrative  . Not on file   Social Determinants of Health   Financial Resource Strain:   . Difficulty of Paying Living Expenses: Not on file  Food Insecurity:   . Worried About Charity fundraiser in the Last Year: Not on file  . Ran Out of Food in the Last Year:  Not on file  Transportation Needs: No Transportation Needs  . Lack of Transportation (Medical): No  . Lack of Transportation (Non-Medical): No  Physical Activity:   . Days of Exercise per Week: Not on file  . Minutes of Exercise per Session: Not on file  Stress:   . Feeling of Stress : Not on file  Social Connections:   . Frequency of Communication with Friends and Family: Not on file  . Frequency of Social Gatherings with  Friends and Family: Not on file  . Attends Religious Services: Not on file  . Active Member of Clubs or Organizations: Not on file  . Attends Archivist Meetings: Not on file  . Marital Status: Not on file  Intimate Partner Violence: Not At Risk  . Fear of Current or Ex-Partner: No  . Emotionally Abused: No  . Physically Abused: No  . Sexually Abused: No    FAMILY HISTORY: Family History  Problem Relation Age of Onset  . Breast cancer Mother   . Diabetes Father   . Hypertension Father   . Stroke Father   . Mental illness Sister   . Hypertension Daughter   . Mental illness Daughter   . Hypertension Brother   . Colon cancer Brother 40       passed away Dec 04, 2018  . Breast cancer Sister   . Esophageal cancer Neg Hx   . Colon polyps Neg Hx   . Rectal cancer Neg Hx   . Stomach cancer Neg Hx     ALLERGIES:  is allergic to bee venom.  MEDICATIONS:  Current Outpatient Medications  Medication Sig Dispense Refill  . albuterol (VENTOLIN HFA) 108 (90 Base) MCG/ACT inhaler Inhale 2 puffs into the lungs every 4 (four) hours as needed for wheezing or shortness of breath. 18 g 0  . amLODipine (NORVASC) 10 MG tablet Take 1 tablet (10 mg total) by mouth daily. 90 tablet 1  . aspirin 81 MG tablet Take 81 mg by mouth daily.    . B-COMPLEX-C PO Take by mouth.    . benzonatate (TESSALON PERLES) 100 MG capsule Take 1 capsule (100 mg total) by mouth 3 (three) times daily as needed. 20 capsule 0  . fluticasone (FLONASE) 50 MCG/ACT nasal spray Place 1 spray into  both nostrils daily. 16 g 0  . glucosamine-chondroitin 500-400 MG tablet Take 1 tablet by mouth 3 (three) times daily.    Marland Kitchen HYDROcodone-acetaminophen (NORCO/VICODIN) 5-325 MG tablet Take 1-2 tablets by mouth every 6 (six) hours as needed for severe pain. 10 tablet 0  . lisinopril (ZESTRIL) 20 MG tablet Take 1 tablet (20 mg total) by mouth daily. 90 tablet 1  . loratadine (CLARITIN) 10 MG tablet Take 1 tablet (10 mg total) by mouth daily. 30 tablet 0  . Multiple Vitamins-Minerals (CENTRUM SILVER 50+MEN PO) Take by mouth.    . naproxen (NAPROSYN) 500 MG tablet Take 1 tablet (500 mg total) by mouth 2 (two) times daily with a meal. As needed for knee pain. 30 tablet 0  . tadalafil (CIALIS) 20 MG tablet      No current facility-administered medications for this visit.   Facility-Administered Medications Ordered in Other Visits  Medication Dose Route Frequency Provider Last Rate Last Admin  . pneumococcal 23 valent vaccine (PNEUMOVAX-23) injection 0.5 mL  0.5 mL Intramuscular Once Irene Limbo, Cloria Spring, MD        REVIEW OF SYSTEMS:   A 10+ POINT REVIEW OF SYSTEMS WAS OBTAINED including neurology, dermatology, psychiatry, cardiac, respiratory, lymph, extremities, GI, GU, Musculoskeletal, constitutional, breasts, reproductive, HEENT.  All pertinent positives are noted in the HPI.  All others are negative.   PHYSICAL EXAMINATION: ECOG PERFORMANCE STATUS: 1 - Symptomatic but completely ambulatory  Vitals:   05/24/19 1150  BP: (!) 145/72  Pulse: 77  Resp: 17  Temp: 98.5 F (36.9 C)  SpO2: 100%   Filed Weights   05/24/19 1150  Weight: (!) 304 lb 12.8 oz (138.3 kg)  Exam was given in a chair   GENERAL:alert, in no acute distress and comfortable SKIN: no acute rashes, no significant lesions EYES: conjunctiva are pink and non-injected, sclera anicteric OROPHARYNX: MMM, no exudates, no oropharyngeal erythema or ulceration NECK: supple, no JVD LYMPH:  no palpable lymphadenopathy in the  cervical, axillary or inguinal regions LUNGS: clear to auscultation b/l with normal respiratory effort HEART: regular rate & rhythm ABDOMEN:  normoactive bowel sounds , non tender, not distended. No palpable hepatosplenomegaly.  Extremity: no pedal edema PSYCH: alert & oriented x 3 with fluent speech NEURO: no focal motor/sensory deficits   LABORATORY DATA:   I have reviewed the data as listed CBC Latest Ref Rng & Units 05/24/2019 03/11/2019 02/13/2019  WBC 4.0 - 10.5 K/uL 7.2 5.8 8.1  Hemoglobin 13.0 - 17.0 g/dL 12.2(L) 12.3(L) 12.7(L)  Hematocrit 39.0 - 52.0 % 36.0(L) 38.6(L) 38.7(L)  Platelets 150 - 400 K/uL 283 271 269   CBC    Component Value Date/Time   WBC 7.2 05/24/2019 1131   RBC 3.87 (L) 05/24/2019 1131   HGB 12.2 (L) 05/24/2019 1131   HCT 36.0 (L) 05/24/2019 1131   PLT 283 05/24/2019 1131   MCV 93.0 05/24/2019 1131   MCV 87.3 12/22/2017 1518   MCH 31.5 05/24/2019 1131   MCHC 33.9 05/24/2019 1131   RDW 13.4 05/24/2019 1131   LYMPHSABS 2.6 05/24/2019 1131   MONOABS 0.6 05/24/2019 1131   EOSABS 0.1 05/24/2019 1131   BASOSABS 0.0 05/24/2019 1131    CMP Latest Ref Rng & Units 05/24/2019 02/13/2019 11/21/2018  Glucose 70 - 99 mg/dL 126(H) 127(H) 99  BUN 8 - 23 mg/dL 14 17 15   Creatinine 0.61 - 1.24 mg/dL 1.03 1.02 0.93  Sodium 135 - 145 mmol/L 143 143 143  Potassium 3.5 - 5.1 mmol/L 3.8 3.6 3.9  Chloride 98 - 111 mmol/L 111 107 107  CO2 22 - 32 mmol/L 23 25 26   Calcium 8.9 - 10.3 mg/dL 8.6(L) 9.5 8.9  Total Protein 6.5 - 8.1 g/dL 7.1 7.0 6.6  Total Bilirubin 0.3 - 1.2 mg/dL 0.4 0.4 0.9  Alkaline Phos 38 - 126 U/L 75 65 55  AST 15 - 41 U/L 14(L) 13(L) 17  ALT 0 - 44 U/L 13 11 14    . Lab Results  Component Value Date   LDH 139 05/24/2019    01/02/18 Left Inguinal LN Bx:   12/26/17 Tissue Flow Cytometry:   12/26/17 Inguinal Core biopsy:    RADIOGRAPHIC STUDIES: I have personally reviewed the radiological images as listed and agreed with the findings in the  report. DG Foot Complete Left  Result Date: 04/28/2019 CLINICAL DATA:  Fourth digit pain after trauma. Previous fracture of the third, fourth, and fifth digits. EXAM: LEFT FOOT - COMPLETE 3+ VIEW COMPARISON:  None. FINDINGS: There is a fracture through the diaphysis of the proximal fourth phalanx without significant displacement. IMPRESSION: Fracture through the proximal fourth phalanx without significant displacement. Electronically Signed   By: Dorise Bullion III M.D   On: 04/28/2019 16:11    ASSESSMENT & PLAN:   This is a pleasant 64 y.o. 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  CT of the abdomen and pelvis performed on 12/22/2017 showed bulky left inguinal, left hemipelvic, and retroperitoneal adenopathy.   01/02/18 Left inguinal LN Biopsy  revealed T-Cell/histocyte rich Large B-Cell Lymphoma  12/27/17 ECHO revealed LV EF of 55-60%   01/05/18 PET/CT revealedMassively enlarged pelvic lymph nodes intense metabolic activity consistent lymphoma. 2. Additional hypermetabolic lymph nodes in the porta hepatis and retroperitoneum LEFT aorta. 3. Solitary hypermetabolic mediastinal lymph node in the upper LEFT Mediastinum. 4. 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 5. Normal spleen.  HIV non reactive on 12/22/2017.Hep C and hep B serology negative.  03/14/18 PET/CT revealedPET-CT findings suggest an excellent response to chemotherapy. The abdominal lymphadenopathy has near completely resolved and demonstrates a near complete metabolic response. The pelvic and inguinal adenopathy has significantly decreased in size and the metabolic activity has significantly decreased. 2. Diffuse  marrow activity likely due to chemotherapy and or marrow stimulating drugs. I do not see any discrete persistent lesions.   04/17/18 CT Head revealedSubtle mesial caudothalamic hypodensities may be artifact though, the could reflect encephalitis or Wernicke's encephalopathy. Consider MRI of the head with and without contrast. 2. Mild chronic small vessel ischemic changes  S/p 6 cycles of EPOCH-R completed on 06/01/18  06/28/18 PET/CT revealed Continued good response to treatment. No residual measurable or hypermetabolic abdominal lymphadenopathy and no recurrent osseous disease. 2. Interval decrease in size of the left operator region lymph node and also the left inguinal lymph node. However, the both have small foci of slightly increased hypermetabolism which bears surveillance. 3. No new or progressive lymphadenopathy in the neck, chest, abdomen or pelvis.  S/p 39.6 Gy in 22 fractions between 07/25/18 and 08/23/18  02/13/2019 CT C/A/P ([id number]) ([id number]) revealed "1. Response to therapy of pelvic adenopathy compared to the PET of 06/28/2018. 2. No new or progressive disease. 3. Mild prostatomegaly. 4. 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 5. Hepatomegaly."  2) left lower extremity swelling- now resolved Doppler ultrasound for DVT was negative in the left lower extremity.  Likely from venous compression +/- lymphatic obstruction from bulky left inguinal, left hemipelvic, and retroperitoneal adenopathy.   3) S/p Port a cath placement   4)h/oE.coli UTI - Pansensitive -Resolved.Also appearedto have BPH like symptomatology. -Was hospitalized 04/16/18 to 04/20/18 for E.coli sepsis from UTI, treated with antibiotics.  5)s/pE.COLi sepsis - likely from urinary source. Recent h/o E.coli UTI  6) s/p Symptomatic Anemia Hgb 6.7 - due to chemotherapy/sepsis- s/p PRBC transfusion. No overt evidence of bleeding. hgb now stable at  10.6  7) HTN-was elevated in setting of improved po intake and steroids -improving control -conitnueon lisinopril 20 mg p.o. daily today -Continue Flomax for his BPH. -increasedamlodipine to 10 mgTo optimize BP control -will monitor and optimize rx accordingly.  8) Likely BPH - on flomax  PLAN: -Discussed pt labwork today, 05/24/19; blood counts and chemistries are stable -Discussed 05/24/2019 LDH is WNL at 139 -Pt will be 1 year out from treatment on 06/01/2018 -No clinical or laboratory evidence of new or progressive Lymphoma -Pt advised to get Shingrix vaccine in 4-6 months -Prevnar, Flu vaccine given on 02/20/2019 -Will give Pneumovax today  -Follow up in 3 months with labs     FOLLOW UP: RTC with Dr Irene Limbo with labs in 3 months   The total time spent in the appt was 20 minutes and more than 50% was on counseling and direct patient cares.  All of the patient's questions were answered with apparent satisfaction. The patient knows to call the clinic with any problems, questions or concerns.   Sullivan Lone MD [place] AAHIVMS [facility] Hematology/Oncology Physician  [place]  (Office):       [phone] (Work cell):  [phone] (Fax):           [phone]  I, Yevette Edwards, am acting as a scribe for Dr. Sullivan Lone.   .I have reviewed the above documentation for accuracy and completeness, and I agree with the above. Brunetta Genera MD

## 2019-05-24 ENCOUNTER — Inpatient Hospital Stay: Payer: 59 | Attending: Oncology | Admitting: Hematology

## 2019-05-24 ENCOUNTER — Other Ambulatory Visit: Payer: Self-pay

## 2019-05-24 ENCOUNTER — Inpatient Hospital Stay: Payer: 59

## 2019-05-24 VITALS — BP 145/72 | HR 77 | Temp 98.5°F | Resp 17 | Ht 72.0 in | Wt 304.8 lb

## 2019-05-24 DIAGNOSIS — I10 Essential (primary) hypertension: Secondary | ICD-10-CM | POA: Insufficient documentation

## 2019-05-24 DIAGNOSIS — Z23 Encounter for immunization: Secondary | ICD-10-CM

## 2019-05-24 DIAGNOSIS — F17210 Nicotine dependence, cigarettes, uncomplicated: Secondary | ICD-10-CM | POA: Diagnosis not present

## 2019-05-24 DIAGNOSIS — Z79899 Other long term (current) drug therapy: Secondary | ICD-10-CM | POA: Insufficient documentation

## 2019-05-24 DIAGNOSIS — C8330 Diffuse large B-cell lymphoma, unspecified site: Secondary | ICD-10-CM | POA: Diagnosis not present

## 2019-05-24 DIAGNOSIS — Z7982 Long term (current) use of aspirin: Secondary | ICD-10-CM | POA: Insufficient documentation

## 2019-05-24 DIAGNOSIS — C8338 Diffuse large B-cell lymphoma, lymph nodes of multiple sites: Secondary | ICD-10-CM | POA: Insufficient documentation

## 2019-05-24 LAB — CMP (CANCER CENTER ONLY)
ALT: 13 U/L (ref 0–44)
AST: 14 U/L — ABNORMAL LOW (ref 15–41)
Albumin: 3.9 g/dL (ref 3.5–5.0)
Alkaline Phosphatase: 75 U/L (ref 38–126)
Anion gap: 9 (ref 5–15)
BUN: 14 mg/dL (ref 8–23)
CO2: 23 mmol/L (ref 22–32)
Calcium: 8.6 mg/dL — ABNORMAL LOW (ref 8.9–10.3)
Chloride: 111 mmol/L (ref 98–111)
Creatinine: 1.03 mg/dL (ref 0.61–1.24)
GFR, Est AFR Am: >60 mL/min (ref >60)
GFR, Estimated: >60 mL/min (ref >60)
Glucose, Bld: 126 mg/dL — ABNORMAL HIGH (ref 70–99)
Potassium: 3.8 mmol/L (ref 3.5–5.1)
Sodium: 143 mmol/L (ref 135–145)
Total Bilirubin: 0.4 mg/dL (ref 0.3–1.2)
Total Protein: 7.1 g/dL (ref 6.5–8.1)

## 2019-05-24 LAB — CBC WITH DIFFERENTIAL/PLATELET
Abs Immature Granulocytes: 0.03 10*3/uL (ref 0.00–0.07)
Basophils Absolute: 0 10*3/uL (ref 0.0–0.1)
Basophils Relative: 1 %
Eosinophils Absolute: 0.1 10*3/uL (ref 0.0–0.5)
Eosinophils Relative: 2 %
HCT: 36 % — ABNORMAL LOW (ref 39.0–52.0)
Hemoglobin: 12.2 g/dL — ABNORMAL LOW (ref 13.0–17.0)
Immature Granulocytes: 0 %
Lymphocytes Relative: 36 %
Lymphs Abs: 2.6 10*3/uL (ref 0.7–4.0)
MCH: 31.5 pg (ref 26.0–34.0)
MCHC: 33.9 g/dL (ref 30.0–36.0)
MCV: 93 fL (ref 80.0–100.0)
Monocytes Absolute: 0.6 10*3/uL (ref 0.1–1.0)
Monocytes Relative: 9 %
Neutro Abs: 3.8 10*3/uL (ref 1.7–7.7)
Neutrophils Relative %: 52 %
Platelets: 283 10*3/uL (ref 150–400)
RBC: 3.87 MIL/uL — ABNORMAL LOW (ref 4.22–5.81)
RDW: 13.4 % (ref 11.5–15.5)
WBC: 7.2 10*3/uL (ref 4.0–10.5)
nRBC: 0 % (ref 0.0–0.2)

## 2019-05-24 LAB — LACTATE DEHYDROGENASE: LDH: 139 U/L (ref 98–192)

## 2019-05-24 MED ORDER — PNEUMOCOCCAL VAC POLYVALENT 25 MCG/0.5ML IJ INJ
0.5000 mL | INJECTION | Freq: Once | INTRAMUSCULAR | Status: AC
  Administered 2019-05-24: 0.5 mL via INTRAMUSCULAR
  Filled 2019-05-24: qty 0.5

## 2019-05-24 NOTE — Patient Instructions (Signed)
Pneumococcal Vaccine, Polyvalent solution for injection What is this medicine? PNEUMOCOCCAL VACCINE, POLYVALENT (NEU mo KOK al vak SEEN, pol ee VEY luhnt) is a vaccine to prevent pneumococcus bacteria infection. These bacteria are a major cause of ear infections, Strep throat infections, and serious pneumonia, meningitis, or blood infections worldwide. These vaccines help the body to produce antibodies (protective substances) that help your body defend against these bacteria. This vaccine is recommended for people 74 years of age and older with health problems. It is also recommended for all adults over 82 years old. This vaccine will not treat an infection. This medicine may be used for other purposes; ask your health care provider or pharmacist if you have questions. COMMON BRAND NAME(S): Pneumovax 23 What should I tell my health care provider before I take this medicine? They need to know if you have any of these conditions:  bleeding problems  bone marrow or organ transplant  cancer, Hodgkin's disease  fever  infection  immune system problems  low platelet count in the blood  seizures  an unusual or allergic reaction to pneumococcal vaccine, diphtheria toxoid, other vaccines, latex, other medicines, foods, dyes, or preservatives  pregnant or trying to get pregnant  breast-feeding How should I use this medicine? This vaccine is for injection into a muscle or under the skin. It is given by a health care professional. A copy of Vaccine Information Statements will be given before each vaccination. Read this sheet carefully each time. The sheet may change frequently. Talk to your pediatrician regarding the use of this medicine in children. While this drug may be prescribed for children as young as 58 years of age for selected conditions, precautions do apply. Overdosage: If you think you have taken too much of this medicine contact a poison control center or emergency room at  once. NOTE: This medicine is only for you. Do not share this medicine with others. What if I miss a dose? It is important not to miss your dose. Call your doctor or health care professional if you are unable to keep an appointment. What may interact with this medicine?  medicines for cancer chemotherapy  medicines that suppress your immune function  medicines that treat or prevent blood clots like warfarin, enoxaparin, and dalteparin  steroid medicines like prednisone or cortisone This list may not describe all possible interactions. Give your health care provider a list of all the medicines, herbs, non-prescription drugs, or dietary supplements you use. Also tell them if you smoke, drink alcohol, or use illegal drugs. Some items may interact with your medicine. What should I watch for while using this medicine? Mild fever and pain should go away in 3 days or less. Report any unusual symptoms to your doctor or health care professional. What side effects may I notice from receiving this medicine? Side effects that you should report to your doctor or health care professional as soon as possible:  allergic reactions like skin rash, itching or hives, swelling of the face, lips, or tongue  breathing problems  confused  fever over 102 degrees F  pain, tingling, numbness in the hands or feet  seizures  unusual bleeding or bruising  unusual muscle weakness Side effects that usually do not require medical attention (report to your doctor or health care professional if they continue or are bothersome):  aches and pains  diarrhea  fever of 102 degrees F or less  headache  irritable  loss of appetite  pain, tender at site where injected  trouble sleeping This list may not describe all possible side effects. Call your doctor for medical advice about side effects. You may report side effects to FDA at 1-800-FDA-1088. Where should I keep my medicine? This does not apply. This  vaccine is given in a clinic, pharmacy, doctor's office, or other health care setting and will not be stored at home. NOTE: This sheet is a summary. It may not cover all possible information. If you have questions about this medicine, talk to your doctor, pharmacist, or health care provider.  2020 Elsevier/Gold Standard (2007-11-30 14:32:37)

## 2019-06-17 ENCOUNTER — Telehealth: Payer: Self-pay | Admitting: Family Medicine

## 2019-06-17 ENCOUNTER — Encounter: Payer: 59 | Admitting: Family Medicine

## 2019-06-17 ENCOUNTER — Other Ambulatory Visit: Payer: Self-pay

## 2019-06-17 NOTE — Telephone Encounter (Signed)
Pt called to make clinic aware that he is not happy with the time of wait , and that he will be changing primary care

## 2019-06-17 NOTE — Progress Notes (Signed)
Subjective:  Patient ID: Jesus [name], male    DOB: 04/20/1956  Age: 64 y.o. MRN: [id number]  CC: No chief complaint on file.   HPI HORTON ELLITHORPE presents for    History Patient Active Problem List   Diagnosis Date Noted  . Chronic pain of right knee 10/17/2018  . Large cell (diffuse) non-Hodgkin's lymphoma ([place]) 05/10/2018  . Bacteremia due to Escherichia coli   . HTN (hypertension) 04/17/2018  . RLS (restless legs syndrome) 04/17/2018  . Abnormal LFTs 04/17/2018  . Acute metabolic encephalopathy 76/16/0737  . AKI (acute kidney injury) ([place]) 04/16/2018  . Dehydration   . Fever, unspecified   . Sepsis ([place])   . Disorientation   . Hypokalemia   . Hypomagnesemia   . Anemia   . Encounter for antineoplastic chemotherapy   . At high risk of tumor lysis syndrome   . Swelling of lower leg   . Diffuse large B cell lymphoma ([place]) 01/15/2018  . 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[place]) 01/12/2018  . Counseling regarding advance care planning and goals of care 01/12/2018  . Bilateral leg pain 05/27/2014   Past Medical History:  Diagnosis Date  . Allergy   . Anemia    during chemo  . Arthritis    knee   . Blood transfusion without reported diagnosis   . Cancer ([place])    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- last radiation 06-2018  . Cataract    removed both eyes with l;ens implants   . Family history of colon cancer    in his brother- dx'd age 39   . History of chemotherapy    last 06-01-2018  . History of kidney stones   . History of radiation therapy    last radiation 06-2018  . Hyperlipidemia    currently under control  . Hypertension   . Irregular heart beats   . Lymphadenopathy   . Pain, lower leg    Bilateral  . Peripheral arterial disease ([place])   . Pre-diabetes   . Red-green color blindness   . RLS (restless legs syndrome)   . Snores   . Wears glasses    Past Surgical History:  Procedure  Laterality Date  . CATARACT EXTRACTION W/ INTRAOCULAR LENS  IMPLANT, BILATERAL    . COLONOSCOPY    . dislodged salava stone    . FRACTURE SURGERY    . HAND ARTHROPLASTY  1995   crushed left hand  . INGUINAL LYMPH NODE BIOPSY Left 01/02/2018   Procedure: LEFT INGUINAL LYMPH NODE BIOPSY;  Surgeon: Rolm Bookbinder, MD;  Location: [place];  Service: General;  Laterality: Left;  . IR IMAGING GUIDED PORT INSERTION  01/15/2018  . IR REMOVAL TUN ACCESS W/ PORT W/O FL MOD SED  03/11/2019  . MICROLARYNGOSCOPY Left 01/17/2014   Procedure: MICROLARYNGOSCOPY WITH EXCISION OF THE BIOPSY OF LEFT VOCAL CORD LESION;  Surgeon: Izora Gala, MD;  Location: [place];  Service: ENT;  Laterality: Left;  . ORIF FOOT FRACTURE  2005   left  . REFRACTIVE SURGERY Right    removed cloudiness in right eye after cataract removal    Allergies  Allergen Reactions  . Bee Venom Anaphylaxis   Prior to Admission medications   Medication Sig Start Date End Date Taking? Authorizing Provider  albuterol (VENTOLIN HFA) 108 (90 Base) MCG/ACT inhaler Inhale 2 puffs into the lungs every 4 (four) hours as needed for wheezing  or shortness of breath. 02/11/19   Hall-Potvin, [place], PA-C  amLODipine (NORVASC) 10 MG tablet Take 1 tablet (10 mg total) by mouth daily. 12/14/18   Wendie Agreste, MD  aspirin 81 MG tablet Take 81 mg by mouth daily.    [provider]  B-COMPLEX-C PO Take by mouth.    [provider]  benzonatate (TESSALON PERLES) 100 MG capsule Take 1 capsule (100 mg total) by mouth 3 (three) times daily as needed. 02/03/19   Sharion Balloon, FNP  fluticasone (FLONASE) 50 MCG/ACT nasal spray Place 1 spray into both nostrils daily. 02/11/19   Hall-Potvin, [place], PA-C  glucosamine-chondroitin 500-400 MG tablet Take 1 tablet by mouth 3 (three) times daily.    [provider]  HYDROcodone-acetaminophen (NORCO/VICODIN) 5-325 MG tablet Take 1-2 tablets by mouth every 6 (six) hours as  needed for severe pain. 04/28/19   Tasia Catchings, Amy V, PA-C  lisinopril (ZESTRIL) 20 MG tablet Take 1 tablet (20 mg total) by mouth daily. 12/14/18   Wendie Agreste, MD  loratadine (CLARITIN) 10 MG tablet Take 1 tablet (10 mg total) by mouth daily. 02/11/19   Hall-Potvin, [place], PA-C  Multiple Vitamins-Minerals (CENTRUM SILVER 50+MEN PO) Take by mouth.    [provider]  naproxen (NAPROSYN) 500 MG tablet Take 1 tablet (500 mg total) by mouth 2 (two) times daily with a meal. As needed for knee pain. 10/17/18   Wendie Agreste, MD  tadalafil (CIALIS) 20 MG tablet  11/29/18   [provider]   Social History   Socioeconomic History  . Marital status: Divorced    Spouse name: Not on file  . Number of children: 3  . Years of education: Not on file  . Highest education level: Not on file  Occupational History  . Not on file  Tobacco Use  . Smoking status: Current Every Day Smoker    Packs/day: 0.50    Years: 36.00    Pack years: 18.00    Types: Cigarettes  . Smokeless tobacco: Never Used  . Tobacco comment: he denies smoking in about 2 weeks 07/13/18  Substance and Sexual Activity  . Alcohol use: Yes    Alcohol/week: 11.0 standard drinks    Types: 5 Shots of liquor, 6 Cans of beer per week    Comment: weekends, he denies current alcohol use 07/13/18  . Drug use: Not Currently    Types: Cocaine    Comment: reports cocaine usage ~2X/ month; last use 12/26/17  . Sexual activity: Not on file  Other Topics Concern  . Not on file  Social History Narrative  . Not on file   Social Determinants of Health   Financial Resource Strain:   . Difficulty of Paying Living Expenses: Not on file  Food Insecurity:   . Worried About Charity fundraiser in the Last Year: Not on file  . Ran Out of Food in the Last Year: Not on file  Transportation Needs: No Transportation Needs  . Lack of Transportation (Medical): No  . Lack of Transportation (Non-Medical): No  Physical Activity:   .  Days of Exercise per Week: Not on file  . Minutes of Exercise per Session: Not on file  Stress:   . Feeling of Stress : Not on file  Social Connections:   . Frequency of Communication with Friends and Family: Not on file  . Frequency of Social Gatherings with Friends and Family: Not on file  . Attends Religious Services: Not on  file  . Active Member of Clubs or Organizations: Not on file  . Attends Archivist Meetings: Not on file  . Marital Status: Not on file  Intimate Partner Violence: Not At Risk  . Fear of Current or Ex-Partner: No  . Emotionally Abused: No  . Physically Abused: No  . Sexually Abused: No    Review of Systems   Objective:  There were no vitals filed for this visit.   Physical Exam     Assessment & Plan:  AMAAD BYERS is a 64 y.o. male . No diagnosis found.   No orders of the defined types were placed in this encounter.  There are no Patient Instructions on file for this visit.    Signed, Merri Ray, MD [facility]

## 2019-06-18 ENCOUNTER — Ambulatory Visit: Payer: 59 | Admitting: Family Medicine

## 2019-07-20 ENCOUNTER — Other Ambulatory Visit: Payer: Self-pay | Admitting: Family Medicine

## 2019-07-20 DIAGNOSIS — I10 Essential (primary) hypertension: Secondary | ICD-10-CM

## 2019-07-30 ENCOUNTER — Other Ambulatory Visit: Payer: Self-pay

## 2019-07-30 ENCOUNTER — Encounter: Payer: Self-pay | Admitting: Emergency Medicine

## 2019-07-30 ENCOUNTER — Ambulatory Visit
Admission: EM | Admit: 2019-07-30 | Discharge: 2019-07-30 | Disposition: A | Discharge status: Home / Self Care | Payer: 59 | Attending: Emergency Medicine | Admitting: Emergency Medicine

## 2019-07-30 DIAGNOSIS — R221 Localized swelling, mass and lump, neck: Secondary | ICD-10-CM | POA: Diagnosis not present

## 2019-07-30 DIAGNOSIS — W57XXXA Bitten or stung by nonvenomous insect and other nonvenomous arthropods, initial encounter: Secondary | ICD-10-CM | POA: Diagnosis not present

## 2019-07-30 MED ORDER — TRIAMCINOLONE ACETONIDE 0.5 % EX OINT: 1.0000 "application " | TOPICAL_OINTMENT | Freq: Two times a day (BID) | CUTANEOUS | 0 refills | Status: DC

## 2019-07-30 NOTE — ED Provider Notes (Signed)
[facility]    CSN: [id number] Arrival date & time: 07/30/19  1732      History   Chief Complaint Chief Complaint  Patient presents with  . Lump to neck    HPI Jesus [name] is a 64 y.o. male presenting for 2 lumps to back of neck that he noticed yesterday.  Patient feels that they are larger last night, but has gone down today.  Patient endorsing mild pruritus, denying pain.  No fevers, chills, arthralgias, myalgias, headache, fatigue.  Denies tick bite, spider bite.  No neck stiffness or rigidity.  States he took OTC anti-inflammatory with moderate relief.   Past Medical History:  Diagnosis Date  . Allergy   . Anemia    during chemo  . Arthritis    knee   . Blood transfusion without reported diagnosis   . Cancer ([place])    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- last radiation 06-2018  . Cataract    removed both eyes with l;ens implants   . Family history of colon cancer    in his brother- dx'd age 92   . History of chemotherapy    last 06-01-2018  . History of kidney stones   . History of radiation therapy    last radiation 06-2018  . Hyperlipidemia    currently under control  . Hypertension   . Irregular heart beats   . Lymphadenopathy   . Pain, lower leg    Bilateral  . Peripheral arterial disease ([place])   . Pre-diabetes   . Red-green color blindness   . RLS (restless legs syndrome)   . Snores   . Wears glasses     Patient Active Problem List   Diagnosis Date Noted  . Chronic pain of right knee 10/17/2018  . Large cell (diffuse) non-Hodgkin's lymphoma ([place]) 05/10/2018  . Bacteremia due to Escherichia coli   . HTN (hypertension) 04/17/2018  . RLS (restless legs syndrome) 04/17/2018  . Abnormal LFTs 04/17/2018  . Acute metabolic encephalopathy 11/57/2620  . AKI (acute kidney injury) ([place]) 04/16/2018  . Dehydration   . Fever, unspecified   . Sepsis ([place])   . Disorientation   . Hypokalemia   .  Hypomagnesemia   . Anemia   . Encounter for antineoplastic chemotherapy   . At high risk of tumor lysis syndrome   . Swelling of lower leg   . Diffuse large B cell lymphoma ([place]) 01/15/2018  . 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[place]) 01/12/2018  . Counseling regarding advance care planning and goals of care 01/12/2018  . Bilateral leg pain 05/27/2014    Past Surgical History:  Procedure Laterality Date  . CATARACT EXTRACTION W/ INTRAOCULAR LENS  IMPLANT, BILATERAL    . COLONOSCOPY    . dislodged salava stone    . FRACTURE SURGERY    . HAND ARTHROPLASTY  1995   crushed left hand  . INGUINAL LYMPH NODE BIOPSY Left 01/02/2018   Procedure: LEFT INGUINAL LYMPH NODE BIOPSY;  Surgeon: Rolm Bookbinder, MD;  Location: [place];  Service: General;  Laterality: Left;  . IR IMAGING GUIDED PORT INSERTION  01/15/2018  . IR REMOVAL TUN ACCESS W/ PORT W/O FL MOD SED  03/11/2019  . MICROLARYNGOSCOPY Left 01/17/2014   Procedure: MICROLARYNGOSCOPY WITH EXCISION OF THE BIOPSY OF LEFT VOCAL CORD LESION;  Surgeon: Izora Gala, MD;  Location: [place];  Service: ENT;  Laterality: Left;  . ORIF  FOOT FRACTURE  2005   left  . REFRACTIVE SURGERY Right    removed cloudiness in right eye after cataract removal        Home Medications    Prior to Admission medications   Medication Sig Start Date End Date Taking? Authorizing Provider  albuterol (VENTOLIN HFA) 108 (90 Base) MCG/ACT inhaler Inhale 2 puffs into the lungs every 4 (four) hours as needed for wheezing or shortness of breath. 02/11/19   Hall-Potvin, [place], PA-C  amLODipine (NORVASC) 10 MG tablet TAKE 1 TABLET BY MOUTH EVERY DAY 07/20/19   Wendie Agreste, MD  aspirin 81 MG tablet Take 81 mg by mouth daily.    [provider]  B-COMPLEX-C PO Take by mouth.    [provider]  benzonatate (TESSALON PERLES) 100 MG capsule Take 1 capsule (100 mg total) by mouth 3 (three) times daily as  needed. 02/03/19   Sharion Balloon, FNP  fluticasone (FLONASE) 50 MCG/ACT nasal spray Place 1 spray into both nostrils daily. 02/11/19   Hall-Potvin, [place], PA-C  glucosamine-chondroitin 500-400 MG tablet Take 1 tablet by mouth 3 (three) times daily.    [provider]  lisinopril (ZESTRIL) 20 MG tablet TAKE 1 TABLET BY MOUTH EVERY DAY 07/20/19   Wendie Agreste, MD  loratadine (CLARITIN) 10 MG tablet Take 1 tablet (10 mg total) by mouth daily. 02/11/19   Hall-Potvin, [place], PA-C  Multiple Vitamins-Minerals (CENTRUM SILVER 50+MEN PO) Take by mouth.    [provider]  naproxen (NAPROSYN) 500 MG tablet Take 1 tablet (500 mg total) by mouth 2 (two) times daily with a meal. As needed for knee pain. 10/17/18   Wendie Agreste, MD  tadalafil (CIALIS) 20 MG tablet  11/29/18   [provider]  triamcinolone ointment (KENALOG) 0.5 % Apply 1 application topically 2 (two) times daily. 07/30/19   Hall-Potvin, [place], PA-C    Family History Family History  Problem Relation Age of Onset  . Breast cancer Mother   . Diabetes Father   . Hypertension Father   . Stroke Father   . Mental illness Sister   . Hypertension Daughter   . Mental illness Daughter   . Hypertension Brother   . Colon cancer Brother 23       passed away 2018/12/13  . Breast cancer Sister   . Esophageal cancer Neg Hx   . Colon polyps Neg Hx   . Rectal cancer Neg Hx   . Stomach cancer Neg Hx     Social History Social History   Tobacco Use  . Smoking status: Current Every Day Smoker    Packs/day: 0.50    Years: 36.00    Pack years: 18.00    Types: Cigarettes  . Smokeless tobacco: Never Used  . Tobacco comment: he denies smoking in about 2 weeks 07/13/18  Substance Use Topics  . Alcohol use: Yes    Alcohol/week: 11.0 standard drinks    Types: 5 Shots of liquor, 6 Cans of beer per week    Comment: weekends, he denies current alcohol use 07/13/18  . Drug use: Not Currently    Types: Cocaine     Comment: reports cocaine usage ~2X/ month; last use 12/26/17     Allergies   Bee venom   Review of Systems As per HPI   Physical Exam Triage Vital Signs ED Triage Vitals  Enc Vitals Group     BP 07/30/19 1742 137/88     Pulse Rate 07/30/19 1742 (!)  104     Resp 07/30/19 1742 18     Temp 07/30/19 1742 98.6 F (37 C)     Temp Source 07/30/19 1742 Oral     SpO2 07/30/19 1742 95 %     Weight --      Height --      Head Circumference --      Peak Flow --      Pain Score 07/30/19 1744 0     Pain Loc --      Pain Edu? --      Excl. in [place]? --    No data found.  Updated Vital Signs BP 137/88 (BP Location: Left Arm)   Pulse (!) 104   Temp 98.6 F (37 C) (Oral)   Resp 18   SpO2 95%   Visual Acuity Right Eye Distance:   Left Eye Distance:   Bilateral Distance:    Right Eye Near:   Left Eye Near:    Bilateral Near:     Physical Exam Constitutional:      General: He is not in acute distress.    Appearance: He is obese. He is not ill-appearing.  HENT:     Head: Normocephalic and atraumatic.  Eyes:     General: No scleral icterus.    Pupils: Pupils are equal, round, and reactive to light.  Cardiovascular:     Rate and Rhythm: Normal rate.  Pulmonary:     Effort: Pulmonary effort is normal. No respiratory distress.     Breath sounds: No wheezing.  Musculoskeletal:     Cervical back: Normal range of motion and neck supple. No rigidity or tenderness.  Lymphadenopathy:     Cervical: No cervical adenopathy.  Skin:    Coloration: Skin is not jaundiced or pale.     Comments: 2 small bumps on back of neck with central punctate.  No overlying erythema, warmth, tenderness.  No streaking, active discharge.  Neurological:     Mental Status: He is alert and oriented to person, place, and time.      UC Treatments / Results  Labs (all labs ordered are listed, but only abnormal results are displayed) Labs Reviewed - No data to display  EKG   Radiology No results  found.  Procedures Procedures (including critical care time)  Medications Ordered in UC Medications - No data to display  Initial Impression / Assessment and Plan / UC Course  I have reviewed the triage vital signs and the nursing notes.  Pertinent labs & imaging results that were available during my care of the patient were reviewed by me and considered in my medical decision making (see chart for details).     Patient afebrile, nontoxic, and with improving physical signs/symptoms today.  Likely bug bite: No known tick bite, spider bite.  No systemic symptoms or lymphadenopathy of head/neck region.  Provided reassurance, will trial triamcinolone for additional inflammation relief.  Return precautions discussed, patient verbalized understanding and is agreeable to plan. Final Clinical Impressions(s) / UC Diagnoses   Final diagnoses:  Bug bite, initial encounter     Discharge Instructions     Take tylenol and ibuprofen for pain    ED Prescriptions    Medication Sig Dispense Auth. Provider   triamcinolone ointment (KENALOG) 0.5 % Apply 1 application topically 2 (two) times daily. 30 g Hall-Potvin, [place], PA-C     PDMP not reviewed this encounter.   Hall-Potvin, [place], [place] 08/01/19 1745

## 2019-07-30 NOTE — Discharge Instructions (Addendum)
Take tylenol and ibuprofen for pain

## 2019-07-30 NOTE — ED Notes (Signed)
Patient able to ambulate independently  

## 2019-07-30 NOTE — ED Triage Notes (Signed)
Pt presents to [facility] for assessment of two swollen glands to the back of her neck, much larger last night, but started to go down while waiting in lobby.  Denies pain.

## 2019-08-19 ENCOUNTER — Other Ambulatory Visit: Payer: Self-pay | Admitting: Family Medicine

## 2019-08-19 DIAGNOSIS — I10 Essential (primary) hypertension: Secondary | ICD-10-CM

## 2019-08-19 NOTE — Telephone Encounter (Signed)
Attempted to call pt no answer. Pt wife stated pt will call back.   Scheduled appointment so we can refill appt.

## 2019-08-19 NOTE — Telephone Encounter (Signed)
Requested medication (s) are due for refill today: yes to both requests  Requested medication (s) are on the active medication list: yes to both  Last refill:  07/20/19 to both meds  Future visit scheduled: no  Notes to clinic:  [facility] no longer scheduling appts for this practice. Pt overdue for appt. Last refill was a courtesy refill.   Requested Prescriptions  Pending Prescriptions Disp Refills   lisinopril (ZESTRIL) 20 MG tablet [Pharmacy Med Name: LISINOPRIL 20 MG TABLET] 30 tablet 0    Sig: TAKE 1 TABLET BY MOUTH EVERY DAY      Cardiovascular:  ACE Inhibitors Failed - 08/19/2019  9:30 AM      Failed - Valid encounter within last 6 months    Recent Outpatient Visits           2 months ago    [facility], Ranell Patrick, MD   8 months ago Annual physical exam   [facility], Ranell Patrick, MD   10 months ago Pain of right tibia   [facility], Ranell Patrick, MD   11 months ago Right knee pain, unspecified chronicity   [facility], Ranell Patrick, MD   1 year ago Groin swelling   [facility], Audrie Lia, PA-C              Passed - Cr in normal range and within 180 days    Creatinine  Date Value Ref Range Status  05/24/2019 1.03 0.61 - 1.24 mg/dL Final   Creat  Date Value Ref Range Status  04/29/2014 1.09 0.50 - 1.35 mg/dL Final          Passed - K in normal range and within 180 days    Potassium  Date Value Ref Range Status  05/24/2019 3.8 3.5 - 5.1 mmol/L Final          Passed - Patient is not pregnant      Passed - Last BP in normal range    BP Readings from Last 1 Encounters:  07/30/19 137/88            amLODipine (NORVASC) 10 MG tablet [Pharmacy Med Name: AMLODIPINE BESYLATE 10 MG TAB] 30 tablet 0    Sig: TAKE 1 TABLET BY MOUTH EVERY DAY      Cardiovascular:  Calcium Channel Blockers Failed - 08/19/2019  9:30 AM      Failed - Valid encounter within last 6 months    Recent  Outpatient Visits           2 months ago    [facility], Ranell Patrick, MD   8 months ago Annual physical exam   [facility], Ranell Patrick, MD   10 months ago Pain of right tibia   [facility], Ranell Patrick, MD   11 months ago Right knee pain, unspecified chronicity   [facility], Ranell Patrick, MD   1 year ago Groin swelling   [facility], PA-C              Passed - Last BP in normal range    BP Readings from Last 1 Encounters:  07/30/19 137/88

## 2019-08-20 NOTE — Telephone Encounter (Signed)
Patient already scheduled for their 6 month medication review

## 2019-08-21 ENCOUNTER — Other Ambulatory Visit: Payer: Self-pay

## 2019-08-21 ENCOUNTER — Ambulatory Visit (INDEPENDENT_AMBULATORY_CARE_PROVIDER_SITE_OTHER): Payer: 59 | Admitting: Family Medicine

## 2019-08-21 ENCOUNTER — Other Ambulatory Visit: Payer: Self-pay | Admitting: *Deleted

## 2019-08-21 VITALS — BP 136/80 | HR 93 | Temp 98.7°F | Resp 16 | Ht 72.0 in | Wt 308.0 lb

## 2019-08-21 DIAGNOSIS — C8330 Diffuse large B-cell lymphoma, unspecified site: Secondary | ICD-10-CM

## 2019-08-21 DIAGNOSIS — I10 Essential (primary) hypertension: Secondary | ICD-10-CM | POA: Diagnosis not present

## 2019-08-21 DIAGNOSIS — B360 Pityriasis versicolor: Secondary | ICD-10-CM

## 2019-08-21 DIAGNOSIS — R739 Hyperglycemia, unspecified: Secondary | ICD-10-CM

## 2019-08-21 DIAGNOSIS — N529 Male erectile dysfunction, unspecified: Secondary | ICD-10-CM | POA: Diagnosis not present

## 2019-08-21 DIAGNOSIS — Z13220 Encounter for screening for lipoid disorders: Secondary | ICD-10-CM

## 2019-08-21 MED ORDER — TADALAFIL 5 MG PO TABS: 2.5000 mg | ORAL_TABLET | Freq: Every day | ORAL | 1 refills | Status: DC | PRN

## 2019-08-21 MED ORDER — LISINOPRIL 20 MG PO TABS: 20.0000 mg | ORAL_TABLET | Freq: Every day | ORAL | 1 refills | Status: DC

## 2019-08-21 MED ORDER — KETOCONAZOLE 200 MG PO TABS: 400.0000 mg | ORAL_TABLET | Freq: Once | ORAL | 0 refills | Status: AC

## 2019-08-21 MED ORDER — AMLODIPINE BESYLATE 10 MG PO TABS: 10.0000 mg | ORAL_TABLET | Freq: Every day | ORAL | 1 refills | Status: DC

## 2019-08-21 NOTE — Progress Notes (Signed)
Subjective:  Patient ID: Jesus [name], male    DOB: 1955/09/25  Age: 64 y.o. MRN: [id number]  CC:  Chief Complaint  Patient presents with  . Hypertension    130/80-90, pt denies physical symptoms, feeling well today  . Medication Refill    pt will need refills on a few medications today    HPI Jesus [name] presents for   Hypertension: norvasc 63m, lisinopril 274mqd Home readings: none.  No new side effects.  BP Readings from Last 3 Encounters:  08/21/19 136/80  07/30/19 137/88  05/24/19 (!) 145/72   Lab Results  Component Value Date   CREATININE 1.03 05/24/2019   Erectile dysfunction. Uses full pill - few 2 times per week. Tried 1028mn past - worked ok.  No back pain, hearing/vision changes. No CP with exertion.  Followed by Dr. KalIrene Limbor large cell nonhodgkins lymphoma.   Hyperglycemia: Has bloodwork planned tomorrow.  Glucose 126 on 05/24/19.though to be due in part to chemo.     History Patient Active Problem List   Diagnosis Date Noted  . Chronic pain of right knee 10/17/2018  . Large cell (diffuse) non-Hodgkin's lymphoma (HCCCarrollton1/06/2018  . Bacteremia due to Escherichia coli   . HTN (hypertension) 04/17/2018  . RLS (restless legs syndrome) 04/17/2018  . Abnormal LFTs 04/17/2018  . Acute metabolic encephalopathy 12/73/53/2992 AKI (acute kidney injury) (HCCLarks[place]2/01/2018  . Dehydration   . Fever, unspecified   . Sepsis ([place]CC[place] . Disorientation   . Hypokalemia   . Hypomagnesemia   . Anemia   . Encounter for antineoplastic chemotherapy   . At high risk of tumor lysis syndrome   . Swelling of lower leg   . Diffuse large B cell lymphoma (HCCMarvin9/01/2018  . 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HCCTalih[place]9/10/2017  . Counseling regarding advance care planning and goals of care 01/12/2018  . Bilateral leg pain 05/27/2014   Past Medical History:  Diagnosis Date  . Allergy   . Anemia    during chemo  . Arthritis    knee    . Blood transfusion without reported diagnosis   . Cancer (HCCJava  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- last radiation 06-2018  . Cataract    removed both eyes with l;ens implants   . Family history of colon cancer    in his brother- dx'd age 61 11. History of chemotherapy    last 06-01-2018  . History of kidney stones   . History of radiation therapy    last radiation 06-2018  . Hyperlipidemia    currently under control  . Hypertension   . Irregular heart beats   . Lymphadenopathy   . Pain, lower leg    Bilateral  . Peripheral arterial disease (HCCNorth De[place] . Pre-diabetes   . Red-green color blindness   . RLS (restless legs syndrome)   . Snores   . Wears glasses    Past Surgical History:  Procedure Laterality Date  . CATARACT EXTRACTION W/ INTRAOCULAR LENS  IMPLANT, BILATERAL    . COLONOSCOPY    . dislodged salava stone    . FRACTURE SURGERY    . HAND ARTHROPLASTY  1995   crushed left hand  . INGUINAL LYMPH NODE BIOPSY Left 01/02/2018   Procedure: LEFT INGUINAL LYMPH NODE BIOPSY;  Surgeon: WakRolm BookbinderD;  [facility]  Laterality: Left;  . IR IMAGING GUIDED PORT INSERTION  01/15/2018  . IR REMOVAL TUN ACCESS W/ PORT W/O FL MOD SED  03/11/2019  . MICROLARYNGOSCOPY Left 01/17/2014   Procedure: MICROLARYNGOSCOPY WITH EXCISION OF THE BIOPSY OF LEFT VOCAL CORD LESION;  Surgeon: Izora Gala, MD;  Location: [facility];  Service: ENT;  Laterality: Left;  . ORIF FOOT FRACTURE  2005   left  . REFRACTIVE SURGERY Right    removed cloudiness in right eye after cataract removal    Allergies  Allergen Reactions  . Bee Venom Anaphylaxis   Prior to Admission medications   Medication Sig Start Date End Date Taking? Authorizing Provider  amLODipine (NORVASC) 10 MG tablet TAKE 1 TABLET BY MOUTH EVERY DAY 07/20/19  Yes Wendie Agreste, MD  aspirin 81 MG tablet Take 81 mg by mouth daily.   Yes [provider]  B-COMPLEX-C PO Take by mouth.   Yes [provider]  benzonatate (TESSALON PERLES) 100 MG capsule Take 1 capsule (100 mg total) by mouth 3 (three) times daily as needed. 02/03/19  Yes Hawks, Christy A, FNP  glucosamine-chondroitin 500-400 MG tablet Take 1 tablet by mouth 3 (three) times daily.   Yes [provider]  lisinopril (ZESTRIL) 20 MG tablet TAKE 1 TABLET BY MOUTH EVERY DAY 07/20/19  Yes Wendie Agreste, MD  loratadine (CLARITIN) 10 MG tablet Take 1 tablet (10 mg total) by mouth daily. 02/11/19  Yes Hall-Potvin, [place], PA-C  Multiple Vitamins-Minerals (CENTRUM SILVER 50+MEN PO) Take by mouth.   Yes [provider]  naproxen (NAPROSYN) 500 MG tablet Take 1 tablet (500 mg total) by mouth 2 (two) times daily with a meal. As needed for knee pain. 10/17/18  Yes Wendie Agreste, MD  tadalafil (CIALIS) 20 MG tablet  11/29/18  Yes [provider]  triamcinolone ointment (KENALOG) 0.5 % Apply 1 application topically 2 (two) times daily. 07/30/19  Yes Hall-Potvin, [place], PA-C  albuterol (VENTOLIN HFA) 108 (90 Base) MCG/ACT inhaler Inhale 2 puffs into the lungs every 4 (four) hours as needed for wheezing or shortness of breath. 02/11/19   Hall-Potvin, [place], PA-C  fluticasone (FLONASE) 50 MCG/ACT nasal spray Place 1 spray into both nostrils daily. 02/11/19   Hall-Potvin, [place], PA-C   Social History   Socioeconomic History  . Marital status: Divorced    Spouse name: Not on file  . Number of children: 3  . Years of education: Not on file  . Highest education level: Not on file  Occupational History  . Not on file  Tobacco Use  . Smoking status: Current Every Day Smoker    Packs/day: 0.50    Years: 36.00    Pack years: 18.00    Types: Cigarettes  . Smokeless tobacco: Never Used  . Tobacco comment: he denies smoking in about 2 weeks 07/13/18  Substance and Sexual Activity  . Alcohol use: Yes    Alcohol/week: 11.0 standard drinks    Types: 5  Shots of liquor, 6 Cans of beer per week    Comment: weekends, he denies current alcohol use 07/13/18  . Drug use: Not Currently    Types: Cocaine    Comment: reports cocaine usage ~2X/ month; last use 12/26/17  . Sexual activity: Not on file  Other Topics Concern  . Not on file  Social History Narrative  . Not on file   Social Determinants of Health   Financial Resource Strain:   . Difficulty of Paying Living  Expenses:   Food Insecurity:   . Worried About Charity fundraiser in the Last Year:   . Arboriculturist in the Last Year:   Transportation Needs:   . Film/video editor (Medical):   Marland Kitchen Lack of Transportation (Non-Medical):   Physical Activity:   . Days of Exercise per Week:   . Minutes of Exercise per Session:   Stress:   . Feeling of Stress :   Social Connections:   . Frequency of Communication with Friends and Family:   . Frequency of Social Gatherings with Friends and Family:   . Attends Religious Services:   . Active Member of Clubs or Organizations:   . Attends Archivist Meetings:   Marland Kitchen Marital Status:   Intimate Partner Violence:   . Fear of Current or Ex-Partner:   . Emotionally Abused:   Marland Kitchen Physically Abused:   . Sexually Abused:     Review of Systems  Constitutional: Negative for fatigue and unexpected weight change.  Eyes: Negative for visual disturbance.  Respiratory: Negative for cough, chest tightness and shortness of breath.   Cardiovascular: Negative for chest pain, palpitations and leg swelling.  Gastrointestinal: Negative for abdominal pain and blood in stool.  Neurological: Negative for dizziness, light-headedness and headaches.     Objective:   Vitals:   08/21/19 1413  BP: 136/80  Pulse: 93  Resp: 16  Temp: 98.7 F (37.1 C)  TempSrc: Temporal  SpO2: 98%  Weight: (!) 308 lb (139.7 kg)  Height: 6' (1.829 m)     Physical Exam Vitals reviewed.  Constitutional:      Appearance: He is well-developed.  HENT:     Head:  Normocephalic and atraumatic.  Eyes:     Pupils: Pupils are equal, round, and reactive to light.  Neck:     Vascular: No carotid bruit or JVD.  Cardiovascular:     Rate and Rhythm: Normal rate and regular rhythm.     Heart sounds: Normal heart sounds. No murmur.  Pulmonary:     Effort: Pulmonary effort is normal.     Breath sounds: Normal breath sounds. No rales.  Skin:    General: Skin is warm and dry.     Comments: Hyperpigmented, slight scale patches across upper back, neck area.  Neurological:     Mental Status: He is alert and oriented to person, place, and time.        Assessment & Plan:  Jesus [name] is a 64 y.o. male . Erectile dysfunction, unspecified erectile dysfunction type - Plan: tadalafil (CIALIS) 5 MG tablet  - options discussed, would like to try daily dosing - 2.5-35m QD, potential side effects and risks discussed.   Essential hypertension - Plan: Comprehensive metabolic panel, Lipid panel, amLODipine (NORVASC) 10 MG tablet, lisinopril (ZESTRIL) 20 MG tablet  -  Stable, tolerating current regimen. Medications refilled. Labs pending as above.   Tinea versicolor - Plan: ketoconazole (NIZORAL) 200 MG tablet  - nizoral 4087mx 1  Hyperglycemia - Plan: Hemoglobin A1c  - screening labs.   Screening for lipid disorders - Plan: Lipid panel   Meds ordered this encounter  Medications  . tadalafil (CIALIS) 5 MG tablet    Sig: Take 0.5-1 tablets (2.5-5 mg total) by mouth daily as needed for erectile dysfunction.    Dispense:  90 tablet    Refill:  1  . amLODipine (NORVASC) 10 MG tablet    Sig: Take 1 tablet (10 mg total) by mouth  daily.    Dispense:  90 tablet    Refill:  1  . lisinopril (ZESTRIL) 20 MG tablet    Sig: Take 1 tablet (20 mg total) by mouth daily.    Dispense:  90 tablet    Refill:  1  . ketoconazole (NIZORAL) 200 MG tablet    Sig: Take 2 tablets (400 mg total) by mouth once for 1 dose.    Dispense:  2 tablet    Refill:  0   Patient  Instructions    Try 1/2 to 1 pill of the Cialis daily.  Let me know if you want to change back to previous dosing.  No other med changes for now.  I will check some labs.  Ketoconazole 2 pills once for rash on the neck.  Recheck 6 months.   Tinea Versicolor  Tinea versicolor is a common fungal infection of the skin. It causes a rash that appears as light or dark patches on the skin. The rash most often occurs on the chest, back, neck, or upper arms. This condition is more common during warm weather. Other than affecting how your skin looks, tinea versicolor usually does not cause other problems. In most cases, the infection goes away in a few weeks with treatment. It may take a few months for the patches on your skin to return to your usual skin color. What are the causes? This condition occurs when a type of fungus that is normally present on the skin starts to overgrow. This fungus is a kind of yeast. The exact cause of the overgrowth is not known. This condition cannot be passed from one person to another (it is not contagious). What increases the risk? This condition is more likely to develop when certain factors are present, such as:  Heat and humidity.  Sweating too much.  Hormone changes.  Oily skin.  A weak disease-fighting system (immunesystem). What are the signs or symptoms? Symptoms of this condition include:  A rash of light or dark patches on your skin. The rash may have: ? Patches of tan or pink spots (on light skin). ? Patches of white or brown spots (on dark skin). ? Patches of skin that do not tan. ? Well-marked edges. ? Scales on the discolored areas.  Mild itching. How is this diagnosed? A health care provider can usually diagnose this condition by looking at your skin. During the exam, he or she may use ultraviolet (UV) light to see how much of your skin has been affected. In some cases, a skin sample may be taken by scraping the rash. This sample will be  viewed under a microscope to check for yeast overgrowth. How is this treated? Treatment for this condition may include:  Dandruff shampoo that is applied to the affected skin during showers or bathing.  Over-the-counter medicated skin cream, lotion, or soaps.  Prescription antifungal medicine in the form of skin cream or pills.  Medicine to help reduce itching. Follow these instructions at home:  Take over-the-counter and prescription medicines only as told by your health care provider.  Apply dandruff shampoo to the affected area if your health care provider told you to do that. You may be instructed to scrub the affected skin for several minutes each day.  Do not scratch the affected area of skin.  Avoid hot and humid conditions.  Do not use tanning booths.  Try to avoid sweating a lot. Contact a health care provider if:  Your symptoms get worse.  You have a fever.  You have redness, swelling, or pain at the site of your rash.  You have fluid or blood coming from your rash.  Your rash feels warm to the touch.  You have pus or a bad smell coming from your rash.  Your rash returns (recurs) after treatment. Summary  Tinea versicolor is a common fungal infection of the skin. It causes a rash that appears as light or dark patches on the skin.  The rash most often occurs on the chest, back, neck, or upper arms.  A health care provider can usually diagnose this condition by looking at your skin.  Treatment may include applying shampoo to the skin and taking or applying medicines. This information is not intended to replace advice given to you by your health care provider. Make sure you discuss any questions you have with your health care provider. Document Revised: 04/07/2017 Document Reviewed: 12/27/2016 Elsevier Patient Education  [employer].    If you have lab work done today you will be contacted with your lab results within the next 2 weeks.  If you have  not heard from [place] then please contact us. The fastest way to get your results is to register for My Chart.   IF you received an x-ray today, you will receive an invoice from [facility]. Please contact [facility] at [phone] with questions or concerns regarding your invoice.   IF you received labwork today, you will receive an invoice from [place]. Please contact [facility] at [phone] with questions or concerns regarding your invoice.   Our billing staff will not be able to assist you with questions regarding bills from these companies.  You will be contacted with the lab results as soon as they are available. The fastest way to get your results is to activate your My Chart account. Instructions are located on the last page of this paperwork. If you have not heard from [place] regarding the results in 2 weeks, please contact this office.         Signed, Merri Ray, MD [facility]

## 2019-08-21 NOTE — Progress Notes (Signed)
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 male is here because of left lower extremity edema and lymphadenopathy.  The patient was seen in the emergency room this past Friday for the same issue.  A CT of the abdomen and pelvis was performed showing bulky left inguinal, left hemipelvic, and retroperitoneal adenopathy.  He was referred to [place] from the emergency room for further evaluation.  Doppler ultrasound of the left lower extremity was performed and was negative for DVT. Patient reports that he has been having left lower extremity edema in his left groin and left leg for approximately 1 month.  He states that the swelling in the left groin started to get better but then worsened.  The left lower extremity edema has slowly worsened over time.  Patient denies having fevers and chills.  He reports that he does have night sweats at times.  He reported having headaches approximate 1 [place]  He thinks his headaches are related to not having his blood pressure medication.  His headaches have now resolved.  He denies visual changes.  The patient denies chest pain, shortness of breath and cough.  No nausea, vomiting, constipation, diarrhea.  Denies abdominal pain.  The patient denies recent weight loss and has actually gained weight recently.  Patient denies epistaxis, bleeding gums, hemoptysis, hematuria, but occasionally, and melena.  He reports increased urinary frequency over the past month but no dysuria.  The patient is here for evaluation and discussion of his recent CT and lab findings.  Interval History:   Jesus [name] returns today  for management and evaluation of his T-Cell/histocyte rich Large B-Cell Lymphoma The patient's last visit with [place] was on 05/24/19. [name].  The pt reports he is good. He has been having sharp pain in his right side. Pt previously was told he has a hernia. He has been spending time outside gardening. Pt has gotten both doses of the COVID19 vaccine.  He has the back of his neck swell up on both sides about gofd ball size. By the time he went to the doctor they had gone away. The pt notice bumps shortly after getting the COVID19 vaccine.   Lab results today (08/22/19) of CBC w/diff and CMP is as follows: all values are WNL except for RBC at 3.92, Hemoglobin at 12.1, HCT at 36.8, Glucose at 194, Creatinine at 1.40, AST at 13, GFR, Est Non Af Am at 53 08/22/19 of LDH at 185   On review of systems, pt reports good energy, and denies sudden weight loss, fever, chills, night sweats, new lumps/bumps, abdominal pain and any other symptoms.    MEDICAL HISTORY:   Past Medical History:  Diagnosis Date  . Allergy   . Anemia    during chemo  . Arthritis    knee   . Blood transfusion without reported diagnosis   . Cancer ([place])    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- last radiation 06-2018  . Cataract    removed both eyes with l;ens implants   .  Family history of colon cancer    in his brother- dx'd age 41   . History of chemotherapy    last 06-01-2018  . History of kidney stones   . History of radiation therapy    last radiation 06-2018  . Hyperlipidemia    currently under control  . Hypertension   . Irregular heart beats   . Lymphadenopathy   . Pain, lower leg    Bilateral  . Peripheral arterial disease ([place])   . Pre-diabetes   . Red-green color blindness   . RLS (restless legs syndrome)   . Snores   . Wears glasses     SURGICAL HISTORY: Past Surgical History:  Procedure Laterality Date  . CATARACT EXTRACTION W/  INTRAOCULAR LENS  IMPLANT, BILATERAL    . COLONOSCOPY    . dislodged salava stone    . FRACTURE SURGERY    . HAND ARTHROPLASTY  1995   crushed left hand  . INGUINAL LYMPH NODE BIOPSY Left 01/02/2018   Procedure: LEFT INGUINAL LYMPH NODE BIOPSY;  Surgeon: Rolm Bookbinder, MD;  Location: [facility];  Service: General;  Laterality: Left;  . IR IMAGING GUIDED PORT INSERTION  01/15/2018  . IR REMOVAL TUN ACCESS W/ PORT W/O FL MOD SED  03/11/2019  . MICROLARYNGOSCOPY Left 01/17/2014   Procedure: MICROLARYNGOSCOPY WITH EXCISION OF THE BIOPSY OF LEFT VOCAL CORD LESION;  Surgeon: Izora Gala, MD;  Location: [facility];  Service: ENT;  Laterality: Left;  . ORIF FOOT FRACTURE  2005   left  . REFRACTIVE SURGERY Right    removed cloudiness in right eye after cataract removal     SOCIAL HISTORY: Social History   Socioeconomic History  . Marital status: Divorced    Spouse name: Not on file  . Number of children: 3  . Years of education: Not on file  . Highest education level: Not on file  Occupational History  . Not on file  Tobacco Use  . Smoking status: Current Every Day Smoker    Packs/day: 0.50    Years: 36.00    Pack years: 18.00    Types: Cigarettes  . Smokeless tobacco: Never Used  . Tobacco comment: he denies smoking in about 2 weeks 07/13/18  Substance and Sexual Activity  . Alcohol use: Yes    Alcohol/week: 11.0 standard drinks    Types: 5 Shots of liquor, 6 Cans of beer per week    Comment: weekends, he denies current alcohol use 07/13/18  . Drug use: Not Currently    Types: Cocaine    Comment: reports cocaine usage ~2X/ month; last use 12/26/17  . Sexual activity: Not on file  Other Topics Concern  . Not on file  Social History Narrative  . Not on file   Social Determinants of Health   Financial Resource Strain:   . Difficulty of Paying Living Expenses:   Food Insecurity:   . Worried About Charity fundraiser in the Last Year:   . Arboriculturist in the Last  Year:   Transportation Needs:   . Film/video editor (Medical):   Marland Kitchen Lack of Transportation (Non-Medical):   Physical Activity:   . Days of Exercise per Week:   . Minutes of Exercise per Session:   Stress:   . Feeling of Stress :   Social Connections:   . Frequency of Communication with Friends and Family:   . Frequency of Social Gatherings with Friends and Family:   . Attends Religious  Services:   . Active Member of Clubs or Organizations:   . Attends Archivist Meetings:   Marland Kitchen Marital Status:   Intimate Partner Violence:   . Fear of Current or Ex-Partner:   . Emotionally Abused:   Marland Kitchen Physically Abused:   . Sexually Abused:     FAMILY HISTORY: Family History  Problem Relation Age of Onset  . Breast cancer Mother   . Diabetes Father   . Hypertension Father   . Stroke Father   . Mental illness Sister   . Hypertension Daughter   . Mental illness Daughter   . Hypertension Brother   . Colon cancer Brother 41       passed away 12/24/2018  . Breast cancer Sister   . Esophageal cancer Neg Hx   . Colon polyps Neg Hx   . Rectal cancer Neg Hx   . Stomach cancer Neg Hx     ALLERGIES:  is allergic to bee venom.  MEDICATIONS:  Current Outpatient Medications  Medication Sig Dispense Refill  . amLODipine (NORVASC) 10 MG tablet Take 1 tablet (10 mg total) by mouth daily. 90 tablet 1  . aspirin 81 MG tablet Take 81 mg by mouth daily.    . B-COMPLEX-C PO Take by mouth.    Marland Kitchen glucosamine-chondroitin 500-400 MG tablet Take 1 tablet by mouth 3 (three) times daily.    Marland Kitchen lisinopril (ZESTRIL) 20 MG tablet Take 1 tablet (20 mg total) by mouth daily. 90 tablet 1  . Multiple Vitamins-Minerals (CENTRUM SILVER 50+MEN PO) Take by mouth.    . tadalafil (CIALIS) 5 MG tablet Take 0.5-1 tablets (2.5-5 mg total) by mouth daily as needed for erectile dysfunction. 90 tablet 1  . triamcinolone ointment (KENALOG) 0.5 % Apply 1 application topically 2 (two) times daily. 30 g 0   No current  facility-administered medications for this visit.    REVIEW OF SYSTEMS:   A 10+ POINT REVIEW OF SYSTEMS WAS OBTAINED including neurology, dermatology, psychiatry, cardiac, respiratory, lymph, extremities, GI, GU, Musculoskeletal, constitutional, breasts, reproductive, HEENT.  All pertinent positives are noted in the HPI.  All others are negative.   PHYSICAL EXAMINATION: ECOG PERFORMANCE STATUS: 1 - Symptomatic but completely ambulatory  Vitals:   08/22/19 1342  BP: 139/77  Pulse: 76  Resp: 18  Temp: 98.5 F (36.9 C)  SpO2: 99%   Filed Weights   08/22/19 1342  Weight: (!) 309 lb 4.8 oz (140.3 kg)    Exam was given in a chair   GENERAL:alert, in no acute distress and comfortable SKIN: no acute rashes, no significant lesions EYES: conjunctiva are pink and non-injected, sclera anicteric OROPHARYNX: MMM, no exudates, no oropharyngeal erythema or ulceration NECK: supple, no JVD LYMPH:  no palpable lymphadenopathy in the cervical, axillary or inguinal regions LUNGS: clear to auscultation b/l with normal respiratory effort HEART: regular rate & rhythm ABDOMEN:  normoactive bowel sounds , non tender, not distended. Extremity: no pedal edema PSYCH: alert & oriented x 3 with fluent speech NEURO: no focal motor/sensory deficits    LABORATORY DATA:   I have reviewed the data as listed CBC Latest Ref Rng & Units 08/22/2019 05/24/2019 03/11/2019  WBC 4.0 - 10.5 K/uL 6.1 7.2 5.8  Hemoglobin 13.0 - 17.0 g/dL 12.1(L) 12.2(L) 12.3(L)  Hematocrit 39.0 - 52.0 % 36.8(L) 36.0(L) 38.6(L)  Platelets 150 - 400 K/uL 264 283 271   CBC    Component Value Date/Time   WBC 6.1 08/22/2019 1325   WBC 7.2 05/24/2019 1131  RBC 3.92 (L) 08/22/2019 1325   HGB 12.1 (L) 08/22/2019 1325   HCT 36.8 (L) 08/22/2019 1325   PLT 264 08/22/2019 1325   MCV 93.9 08/22/2019 1325   MCV 87.3 12/22/2017 1518   MCH 30.9 08/22/2019 1325   MCHC 32.9 08/22/2019 1325   RDW 13.6 08/22/2019 1325   LYMPHSABS 2.2  08/22/2019 1325   MONOABS 0.6 08/22/2019 1325   EOSABS 0.1 08/22/2019 1325   BASOSABS 0.0 08/22/2019 1325    CMP Latest Ref Rng & Units 08/22/2019 08/21/2019 05/24/2019  Glucose 70 - 99 mg/dL 194(H) 122(H) 126(H)  BUN 8 - 23 mg/dL 13 17 14   Creatinine 0.61 - 1.24 mg/dL 1.40(H) 1.10 1.03  Sodium 135 - 145 mmol/L 142 143 143  Potassium 3.5 - 5.1 mmol/L 4.2 4.3 3.8  Chloride 98 - 111 mmol/L 108 106 111  CO2 22 - 32 mmol/L 26 24 23   Calcium 8.9 - 10.3 mg/dL 9.1 9.7 8.6(L)  Total Protein 6.5 - 8.1 g/dL 6.9 7.0 7.1  Total Bilirubin 0.3 - 1.2 mg/dL 0.5 0.4 0.4  Alkaline Phos 38 - 126 U/L 58 66 75  AST 15 - 41 U/L 13(L) 19 14(L)  ALT 0 - 44 U/L 13 16 13    . Lab Results  Component Value Date   LDH 185 08/22/2019    01/02/18 Left Inguinal LN Bx:   12/26/17 Tissue Flow Cytometry:   12/26/17 Inguinal Core biopsy:    RADIOGRAPHIC STUDIES: I have personally reviewed the radiological images as listed and agreed with the findings in the report. No results found.  ASSESSMENT & PLAN:   This is a pleasant 64 y.o. 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  CT of the abdomen and pelvis performed on 12/22/2017 showed bulky left inguinal, left hemipelvic, and retroperitoneal adenopathy.   01/02/18 Left inguinal LN Biopsy revealed T-Cell/histocyte rich Large B-Cell Lymphoma  12/27/17 ECHO revealed LV EF of 55-60%   01/05/18 PET/CT revealedMassively enlarged pelvic lymph nodes intense metabolic activity consistent lymphoma. 2. Additional hypermetabolic lymph nodes in the porta hepatis and retroperitoneum LEFT aorta. 3. Solitary hypermetabolic mediastinal lymph node in the upper LEFT Mediastinum. 4. 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 5. Normal spleen.  HIV non reactive on 12/22/2017.Hep C and hep B serology negative.  03/14/18 PET/CT revealedPET-CT findings suggest an excellent response to chemotherapy. The abdominal lymphadenopathy has near completely resolved and demonstrates a near complete metabolic response. The pelvic and inguinal adenopathy has significantly decreased in size and the metabolic activity has significantly decreased. 2. Diffuse marrow activity likely due to chemotherapy and or marrow stimulating drugs. I do not see any discrete persistent lesions.   04/17/18 CT Head revealedSubtle mesial caudothalamic hypodensities may be artifact though, the could reflect encephalitis or Wernicke's encephalopathy. Consider MRI of the head with and without contrast. 2. Mild chronic small vessel ischemic changes  S/p 6 cycles of EPOCH-R completed on 06/01/18  06/28/18 PET/CT revealed Continued good response to treatment. No residual measurable or hypermetabolic abdominal lymphadenopathy and no recurrent osseous disease. 2. Interval decrease in size of the left operator region lymph node and also the left inguinal lymph node. However, the both have small foci of slightly increased hypermetabolism which bears surveillance. 3. No new or progressive lymphadenopathy in the neck, chest, abdomen or  pelvis.  S/p 39.6 Gy in 22 fractions between 07/25/18 and 08/23/18  02/13/2019 CT C/A/P ([id number]) ([id number]) revealed "1. Response to therapy of pelvic adenopathy compared to the PET of 06/28/2018. 2. No new or progressive disease. 3. Mild prostatomegaly. 4. 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 5. Hepatomegaly."  2) left lower extremity swelling- now resolved Doppler ultrasound for DVT was negative in the left lower extremity.  Likely from venous compression +/- lymphatic obstruction from bulky left inguinal,  left hemipelvic, and retroperitoneal adenopathy.   3) S/p Port a cath placement - removed 03/11/2019  4)h/oE.coli UTI - Pansensitive -Resolved.Also appearedto have BPH like symptomatology. -Was hospitalized 04/16/18 to 04/20/18 for E.coli sepsis from UTI, treated with antibiotics.  5) s/p Symptomatic Anemia Hgb 6.7 - due to chemotherapy/sepsis- s/p PRBC transfusion. No overt evidence of bleeding. hgb now stable at 10.6  6) HTN-was elevated in setting of improved po intake and steroids -improving control -continue f/u with PCP  8) Likely BPH - on flomax  PLAN: -Discussed pt labwork today, 08/22/19; of CBC w/diff and CMP is as follows: all values are WNL except for RBC at 3.92, Hemoglobin at 12.1, HCT at 36.8, Glucose at 194, Creatinine at 1.40, AST at 13, GFR, Est Non Af Am at 53 -Discussed 08/22/19 of LDH at 185  -Pt is > 1 year out from last treatment on 06/01/2018 -No clinical or laboratory evidence of new or progressive Lymphoma -Recommended that the pt continue to eat well, drink at least 48-64 oz of water each day, and walk 20-30 minutes each day. -Advised going to PT  -Will see back in 3 months   FOLLOW UP: RTC with Dr Irene Limbo with labs in 3 months  The total time spent in the appt was 20 minutes and more than 50% was on counseling and direct patient cares.  All of the patient's questions were answered with apparent satisfaction. The patient knows to call the clinic with any problems, questions or concerns.  Sullivan Lone MD [place] AAHIVMS [facility] Hematology/Oncology Physician [facility]  (Office):       [phone] (Work cell):  [phone] (Fax):           [phone]  I, Dawayne Cirri am acting as a scribe for Dr. Sullivan Lone.   .I have reviewed the above documentation for accuracy and completeness, and I agree with the above. Brunetta Genera MD

## 2019-08-21 NOTE — Patient Instructions (Addendum)
Try 1/2 to 1 pill of the Cialis daily.  Let me know if you want to change back to previous dosing.  No other med changes for now.  I will check some labs.  Ketoconazole 2 pills once for rash on the neck.  Recheck 6 months.   Tinea Versicolor  Tinea versicolor is a common fungal infection of the skin. It causes a rash that appears as light or dark patches on the skin. The rash most often occurs on the chest, back, neck, or upper arms. This condition is more common during warm weather. Other than affecting how your skin looks, tinea versicolor usually does not cause other problems. In most cases, the infection goes away in a few weeks with treatment. It may take a few months for the patches on your skin to return to your usual skin color. What are the causes? This condition occurs when a type of fungus that is normally present on the skin starts to overgrow. This fungus is a kind of yeast. The exact cause of the overgrowth is not known. This condition cannot be passed from one person to another (it is not contagious). What increases the risk? This condition is more likely to develop when certain factors are present, such as:  Heat and humidity.  Sweating too much.  Hormone changes.  Oily skin.  A weak disease-fighting system (immunesystem). What are the signs or symptoms? Symptoms of this condition include:  A rash of light or dark patches on your skin. The rash may have: ? Patches of tan or pink spots (on light skin). ? Patches of white or brown spots (on dark skin). ? Patches of skin that do not tan. ? Well-marked edges. ? Scales on the discolored areas.  Mild itching. How is this diagnosed? A health care provider can usually diagnose this condition by looking at your skin. During the exam, he or she may use ultraviolet (UV) light to see how much of your skin has been affected. In some cases, a skin sample may be taken by scraping the rash. This sample will be viewed under a  microscope to check for yeast overgrowth. How is this treated? Treatment for this condition may include:  Dandruff shampoo that is applied to the affected skin during showers or bathing.  Over-the-counter medicated skin cream, lotion, or soaps.  Prescription antifungal medicine in the form of skin cream or pills.  Medicine to help reduce itching. Follow these instructions at home:  Take over-the-counter and prescription medicines only as told by your health care provider.  Apply dandruff shampoo to the affected area if your health care provider told you to do that. You may be instructed to scrub the affected skin for several minutes each day.  Do not scratch the affected area of skin.  Avoid hot and humid conditions.  Do not use tanning booths.  Try to avoid sweating a lot. Contact a health care provider if:  Your symptoms get worse.  You have a fever.  You have redness, swelling, or pain at the site of your rash.  You have fluid or blood coming from your rash.  Your rash feels warm to the touch.  You have pus or a bad smell coming from your rash.  Your rash returns (recurs) after treatment. Summary  Tinea versicolor is a common fungal infection of the skin. It causes a rash that appears as light or dark patches on the skin.  The rash most often occurs on the chest, back, neck,  or upper arms.  A health care provider can usually diagnose this condition by looking at your skin.  Treatment may include applying shampoo to the skin and taking or applying medicines. This information is not intended to replace advice given to you by your health care provider. Make sure you discuss any questions you have with your health care provider. Document Revised: 04/07/2017 Document Reviewed: 12/27/2016 Elsevier Patient Education  [employer].    If you have lab work done today you will be contacted with your lab results within the next 2 weeks.  If you have not heard from  [place] then please contact us. The fastest way to get your results is to register for My Chart.   IF you received an x-ray today, you will receive an invoice from [facility]. Please contact [facility] at [phone] with questions or concerns regarding your invoice.   IF you received labwork today, you will receive an invoice from [facility]. Please contact [facility] at [phone] with questions or concerns regarding your invoice.   Our billing staff will not be able to assist you with questions regarding bills from these companies.  You will be contacted with the lab results as soon as they are available. The fastest way to get your results is to activate your My Chart account. Instructions are located on the last page of this paperwork. If you have not heard from [place] regarding the results in 2 weeks, please contact this office.

## 2019-08-22 ENCOUNTER — Inpatient Hospital Stay: Payer: 59

## 2019-08-22 ENCOUNTER — Inpatient Hospital Stay: Payer: 59 | Attending: Oncology | Admitting: Hematology

## 2019-08-22 VITALS — BP 139/77 | HR 76 | Temp 98.5°F | Resp 18 | Ht 72.0 in | Wt 309.3 lb

## 2019-08-22 DIAGNOSIS — Z8249 Family history of ischemic heart disease and other diseases of the circulatory system: Secondary | ICD-10-CM | POA: Insufficient documentation

## 2019-08-22 DIAGNOSIS — Z9221 Personal history of antineoplastic chemotherapy: Secondary | ICD-10-CM | POA: Diagnosis not present

## 2019-08-22 DIAGNOSIS — F17210 Nicotine dependence, cigarettes, uncomplicated: Secondary | ICD-10-CM | POA: Insufficient documentation

## 2019-08-22 DIAGNOSIS — C8338 Diffuse large B-cell lymphoma, lymph nodes of multiple sites: Secondary | ICD-10-CM | POA: Diagnosis present

## 2019-08-22 DIAGNOSIS — Z800 Family history of malignant neoplasm of digestive organs: Secondary | ICD-10-CM | POA: Insufficient documentation

## 2019-08-22 DIAGNOSIS — Z923 Personal history of irradiation: Secondary | ICD-10-CM | POA: Diagnosis not present

## 2019-08-22 DIAGNOSIS — Z803 Family history of malignant neoplasm of breast: Secondary | ICD-10-CM | POA: Diagnosis not present

## 2019-08-22 DIAGNOSIS — C8330 Diffuse large B-cell lymphoma, unspecified site: Secondary | ICD-10-CM

## 2019-08-22 DIAGNOSIS — Z833 Family history of diabetes mellitus: Secondary | ICD-10-CM | POA: Insufficient documentation

## 2019-08-22 DIAGNOSIS — I10 Essential (primary) hypertension: Secondary | ICD-10-CM | POA: Insufficient documentation

## 2019-08-22 LAB — CBC WITH DIFFERENTIAL (CANCER CENTER ONLY)
Abs Immature Granulocytes: 0.01 10*3/uL (ref 0.00–0.07)
Basophils Absolute: 0 10*3/uL (ref 0.0–0.1)
Basophils Relative: 1 %
Eosinophils Absolute: 0.1 10*3/uL (ref 0.0–0.5)
Eosinophils Relative: 2 %
HCT: 36.8 % — ABNORMAL LOW (ref 39.0–52.0)
Hemoglobin: 12.1 g/dL — ABNORMAL LOW (ref 13.0–17.0)
Immature Granulocytes: 0 %
Lymphocytes Relative: 36 %
Lymphs Abs: 2.2 10*3/uL (ref 0.7–4.0)
MCH: 30.9 pg (ref 26.0–34.0)
MCHC: 32.9 g/dL (ref 30.0–36.0)
MCV: 93.9 fL (ref 80.0–100.0)
Monocytes Absolute: 0.6 10*3/uL (ref 0.1–1.0)
Monocytes Relative: 9 %
Neutro Abs: 3.2 10*3/uL (ref 1.7–7.7)
Neutrophils Relative %: 52 %
Platelet Count: 264 10*3/uL (ref 150–400)
RBC: 3.92 MIL/uL — ABNORMAL LOW (ref 4.22–5.81)
RDW: 13.6 % (ref 11.5–15.5)
WBC Count: 6.1 10*3/uL (ref 4.0–10.5)
nRBC: 0 % (ref 0.0–0.2)

## 2019-08-22 LAB — LIPID PANEL
Chol/HDL Ratio: 3.9 ratio (ref 0.0–5.0)
Cholesterol, Total: 124 mg/dL (ref 100–199)
HDL: 32 mg/dL — ABNORMAL LOW (ref >39)
LDL Chol Calc (NIH): 43 mg/dL (ref 0–99)
Triglycerides: 326 mg/dL — ABNORMAL HIGH (ref 0–149)
VLDL Cholesterol Cal: 49 mg/dL — ABNORMAL HIGH (ref 5–40)

## 2019-08-22 LAB — CMP (CANCER CENTER ONLY)
ALT: 13 U/L (ref 0–44)
AST: 13 U/L — ABNORMAL LOW (ref 15–41)
Albumin: 3.9 g/dL (ref 3.5–5.0)
Alkaline Phosphatase: 58 U/L (ref 38–126)
Anion gap: 8 (ref 5–15)
BUN: 13 mg/dL (ref 8–23)
CO2: 26 mmol/L (ref 22–32)
Calcium: 9.1 mg/dL (ref 8.9–10.3)
Chloride: 108 mmol/L (ref 98–111)
Creatinine: 1.4 mg/dL — ABNORMAL HIGH (ref 0.61–1.24)
GFR, Est AFR Am: >60 mL/min (ref >60)
GFR, Estimated: 53 mL/min — ABNORMAL LOW (ref >60)
Glucose, Bld: 194 mg/dL — ABNORMAL HIGH (ref 70–99)
Potassium: 4.2 mmol/L (ref 3.5–5.1)
Sodium: 142 mmol/L (ref 135–145)
Total Bilirubin: 0.5 mg/dL (ref 0.3–1.2)
Total Protein: 6.9 g/dL (ref 6.5–8.1)

## 2019-08-22 LAB — COMPREHENSIVE METABOLIC PANEL
ALT: 16 IU/L (ref 0–44)
AST: 19 IU/L (ref 0–40)
Albumin/Globulin Ratio: 2 (ref 1.2–2.2)
Albumin: 4.7 g/dL (ref 3.8–4.8)
Alkaline Phosphatase: 66 IU/L (ref 39–117)
BUN/Creatinine Ratio: 15 (ref 10–24)
BUN: 17 mg/dL (ref 8–27)
Bilirubin Total: 0.4 mg/dL (ref 0.0–1.2)
CO2: 24 mmol/L (ref 20–29)
Calcium: 9.7 mg/dL (ref 8.6–10.2)
Chloride: 106 mmol/L (ref 96–106)
Creatinine, Ser: 1.1 mg/dL (ref 0.76–1.27)
GFR calc Af Amer: 82 mL/min/{1.73_m2} (ref >59)
GFR calc non Af Amer: 71 mL/min/{1.73_m2} (ref >59)
Globulin, Total: 2.3 g/dL (ref 1.5–4.5)
Glucose: 122 mg/dL — ABNORMAL HIGH (ref 65–99)
Potassium: 4.3 mmol/L (ref 3.5–5.2)
Sodium: 143 mmol/L (ref 134–144)
Total Protein: 7 g/dL (ref 6.0–8.5)

## 2019-08-22 LAB — HEMOGLOBIN A1C
Est. average glucose Bld gHb Est-mCnc: 126 mg/dL
Hgb A1c MFr Bld: 6 % — ABNORMAL HIGH (ref 4.8–5.6)

## 2019-08-22 LAB — LACTATE DEHYDROGENASE: LDH: 185 U/L (ref 98–192)

## 2019-08-23 ENCOUNTER — Telehealth: Payer: Self-pay | Admitting: Hematology

## 2019-08-23 NOTE — Telephone Encounter (Signed)
Scheduled per los, patient has been called and voicemail was left.

## 2019-08-24 ENCOUNTER — Encounter: Payer: Self-pay | Admitting: Family Medicine

## 2019-10-24 ENCOUNTER — Ambulatory Visit: Payer: 59 | Admitting: Podiatrist

## 2019-10-24 ENCOUNTER — Other Ambulatory Visit: Payer: Self-pay

## 2019-10-24 ENCOUNTER — Encounter: Payer: Self-pay | Admitting: Podiatrist

## 2019-10-24 VITALS — Temp 97.3°F

## 2019-10-24 DIAGNOSIS — L600 Ingrowing nail: Secondary | ICD-10-CM

## 2019-10-24 DIAGNOSIS — L851 Acquired keratosis [keratoderma] palmaris et plantaris: Secondary | ICD-10-CM

## 2019-10-24 NOTE — Progress Notes (Signed)
    Chief Complaint  Patient presents with  . Ingrown Toenail    Bilateral hallux, bilateral borders. x3 yrs, intermittent. Tender - no drainage or bleeding.  . Plantar Warts    Per pt - 1 on R heel. 7-9/10 pain.      HPI: Patient is 64 y.o. male who presents today for the concerns as listed above.    Review of Systems No fevers, chills, nausea, muscle aches, no difficulty breathing, no calf pain, no chest pain or shortness of breath.   Physical Exam  GENERAL APPEARANCE: Alert, conversant. Appropriately groomed. No acute distress.   VASCULAR: Pedal pulses palpable DP and PT bilateral.  Capillary refill time is immediate to all digits,  Proximal to distal cooling it warm to warm.  Digital hair growth is present bilateral   NEUROLOGIC: sensation is intact epicritically and protectively to 5.07 monofilament at 5/5 sites bilateral.  Light touch is intact bilateral, vibratory sensation intact bilateral, achilles tendon reflex is intact bilateral.   MUSCULOSKELETAL: acceptable muscle strength, tone and stability bilateral.  No gross boney pedal deformities noted.  No pain, crepitus or limitation noted with foot and ankle range of motion bilateral.   DERMATOLOGIC: skin is warm, supple, and dry.  No open lesions noted.  No rash, no pre ulcerative lesions.  Hallux nails are darkened and deeply are not incurvated on the medial and lateral borders.  No redness, no swelling, no sign of infection noted.  Patient also has a well-circumscribed porokeratotic lesion with a central core noted on the plantar medial aspect of the right heel.  Pain with compression noted.   Assessment     ICD-10-CM   1. Ingrowing nail  L60.0   2. 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  Paring of the porokeratotic lesion in its central core was also performed as well.  Salena cane  and paper tape was applied.  He will be seen back as needed for follow-up and possible preventive debridements on the bilateral hallux nails.  Also we discussed the possibility of a permanent phenol matrixectomy if the toenails continue to be a problem.

## 2019-11-06 ENCOUNTER — Ambulatory Visit: Payer: 59 | Admitting: Podiatry

## 2019-11-22 ENCOUNTER — Inpatient Hospital Stay: Payer: 59 | Admitting: Hematology

## 2019-11-22 ENCOUNTER — Telehealth: Payer: Self-pay | Admitting: Hematology

## 2019-11-22 ENCOUNTER — Inpatient Hospital Stay: Payer: 59

## 2019-11-22 NOTE — Telephone Encounter (Signed)
Called pt per 7/16 sch msg - no answer. Left message for patient with call back to reschedule appt.

## 2019-11-27 NOTE — Progress Notes (Signed)
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 male is here because of left lower extremity edema and lymphadenopathy.  The patient was seen in the emergency room this past Friday for the same issue.  A CT of the abdomen and pelvis was performed showing bulky left inguinal, left hemipelvic, and retroperitoneal adenopathy.  He was referred to [place] from the emergency room for further evaluation.  Doppler ultrasound of the left lower extremity was performed and was negative for DVT. Patient reports that he has been having left lower extremity edema in his left groin and left leg for approximately 1 month.  He states that the swelling in the left groin started to get better but then worsened.  The left lower extremity edema has slowly worsened over time.  Patient denies having fevers and chills.  He reports that he does have night sweats at times.  He reported having headaches approximate 1 [place]  He thinks his headaches are related to not having his blood pressure medication.  His headaches have now resolved.  He denies visual changes.  The patient denies chest pain, shortness of breath and cough.  No nausea, vomiting, constipation, diarrhea.  Denies abdominal pain.  The patient denies recent weight loss and has actually gained weight recently.  Patient denies epistaxis, bleeding gums, hemoptysis, hematuria, but occasionally, and melena.  He reports increased urinary frequency over the past month but no dysuria.  The patient is here for evaluation and discussion of his recent CT and lab findings.  Interval History:   MARKEL KURTENBACH returns today  for management and evaluation of his T-Cell/histocyte rich Large B-Cell Lymphoma The patient's last visit with [place] was on 08/22/19. [name].  The pt reports he is good. Pt is staying active. He has had a sore throat and his side hurting. Pt has gotten his COVID19 vaccines. He has a physical next week. Pt is smoking half a pack a day.   Lab results today (11/28/19) of CBC w/diff and CMP is as follows: all values are WNL except for RBC at 4.09, Hemoglobin at 12.3, HCT at 37.3, Glucose at 114 11/28/19 of LDH at 161: WNL  On review of systems, pt reports staying active, soar throat and denies pedal edema, tingling/numbness in hands/feet, fevers, chills, night sweats, new lumps/bumps, skin rashes, mouth soars and any other symptoms.   MEDICAL HISTORY:   Past Medical History:  Diagnosis Date  . Allergy   . Anemia    during chemo  . Arthritis    knee   . Blood transfusion without reported diagnosis   . 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 Cataract    removed both eyes with l;ens implants   . Family history of colon cancer    in his brother- dx'd age 52   . History of chemotherapy    last 06-01-2018  . History of kidney stones   . History of radiation therapy    last radiation 06-2018  .  Hyperlipidemia    currently under control  . Hypertension   . Irregular heart beats   . Lymphadenopathy   . Pain, lower leg    Bilateral  . Peripheral arterial disease ([place])   . Pre-diabetes   . Red-green color blindness   . RLS (restless legs syndrome)   . Snores   . Wears glasses     SURGICAL HISTORY: Past Surgical History:  Procedure Laterality Date  . CATARACT EXTRACTION W/ INTRAOCULAR LENS  IMPLANT, BILATERAL    . COLONOSCOPY    . dislodged salava stone    . FRACTURE SURGERY    . HAND ARTHROPLASTY  1995   crushed left hand  . INGUINAL LYMPH NODE BIOPSY Left 01/02/2018    Procedure: LEFT INGUINAL LYMPH NODE BIOPSY;  Surgeon: Rolm Bookbinder, MD;  Location: [facility];  Service: General;  Laterality: Left;  . IR IMAGING GUIDED PORT INSERTION  01/15/2018  . IR REMOVAL TUN ACCESS W/ PORT W/O FL MOD SED  03/11/2019  . MICROLARYNGOSCOPY Left 01/17/2014   Procedure: MICROLARYNGOSCOPY WITH EXCISION OF THE BIOPSY OF LEFT VOCAL CORD LESION;  Surgeon: Izora Gala, MD;  Location: [facility];  Service: ENT;  Laterality: Left;  . ORIF FOOT FRACTURE  2005   left  . REFRACTIVE SURGERY Right    removed cloudiness in right eye after cataract removal     SOCIAL HISTORY: Social History   Socioeconomic History  . Marital status: Divorced    Spouse name: Not on file  . Number of children: 3  . Years of education: Not on file  . Highest education level: Not on file  Occupational History  . Not on file  Tobacco Use  . Smoking status: Current Every Day Smoker    Packs/day: 0.50    Years: 36.00    Pack years: 18.00    Types: Cigarettes  . Smokeless tobacco: Never Used  . Tobacco comment: he denies smoking in about 2 weeks 07/13/18  Vaping Use  . Vaping Use: Never used  Substance and Sexual Activity  . Alcohol use: Yes    Alcohol/week: 11.0 standard drinks    Types: 5 Shots of liquor, 6 Cans of beer per week    Comment: weekends, he denies current alcohol use 07/13/18  . Drug use: Not Currently    Types: Cocaine    Comment: reports cocaine usage ~2X/ month; last use 12/26/17  . Sexual activity: Not on file  Other Topics Concern  . Not on file  Social History Narrative  . Not on file   Social Determinants of Health   Financial Resource Strain:   . Difficulty of Paying Living Expenses:   Food Insecurity:   . Worried About Charity fundraiser in the Last Year:   . Arboriculturist in the Last Year:   Transportation Needs:   . Film/video editor (Medical):   Marland Kitchen Lack of Transportation (Non-Medical):   Physical Activity:   . Days of Exercise per Week:     . Minutes of Exercise per Session:   Stress:   . Feeling of Stress :   Social Connections:   . Frequency of Communication with Friends and Family:   . Frequency of Social Gatherings with Friends and Family:   . Attends Religious Services:   . Active Member of Clubs or Organizations:   . Attends Archivist Meetings:   Marland Kitchen Marital Status:   Intimate Partner Violence:   . Fear of Current or Ex-Partner:   .  Emotionally Abused:   Marland Kitchen Physically Abused:   . Sexually Abused:     FAMILY HISTORY: Family History  Problem Relation Age of Onset  . Breast cancer Mother   . Diabetes Father   . Hypertension Father   . Stroke Father   . Mental illness Sister   . Hypertension Daughter   . Mental illness Daughter   . Hypertension Brother   . Colon cancer Brother 73       passed away 12-17-2018  . Breast cancer Sister   . Esophageal cancer Neg Hx   . Colon polyps Neg Hx   . Rectal cancer Neg Hx   . Stomach cancer Neg Hx     ALLERGIES:  is allergic to bee venom.  MEDICATIONS:  Current Outpatient Medications  Medication Sig Dispense Refill  . amLODipine (NORVASC) 10 MG tablet Take 1 tablet (10 mg total) by mouth daily. 90 tablet 1  . aspirin 81 MG tablet Take 81 mg by mouth daily.    . B-COMPLEX-C PO Take by mouth.    Marland Kitchen glucosamine-chondroitin 500-400 MG tablet Take 1 tablet by mouth 3 (three) times daily.    Marland Kitchen lisinopril (ZESTRIL) 20 MG tablet Take 1 tablet (20 mg total) by mouth daily. 90 tablet 1  . Multiple Vitamins-Minerals (CENTRUM SILVER 50+MEN PO) Take by mouth.    . tadalafil (CIALIS) 5 MG tablet Take 0.5-1 tablets (2.5-5 mg total) by mouth daily as needed for erectile dysfunction. 90 tablet 1  . triamcinolone ointment (KENALOG) 0.5 % Apply 1 application topically 2 (two) times daily. 30 g 0   No current facility-administered medications for this visit.    REVIEW OF SYSTEMS:   A 10+ POINT REVIEW OF SYSTEMS WAS OBTAINED including neurology, dermatology, psychiatry,  cardiac, respiratory, lymph, extremities, GI, GU, Musculoskeletal, constitutional, breasts, reproductive, HEENT.  All pertinent positives are noted in the HPI.  All others are negative.   PHYSICAL EXAMINATION: ECOG PERFORMANCE STATUS: 1 - Symptomatic but completely ambulatory  Vitals:   11/28/19 1343  BP: (!) 145/79  Pulse: 90  Resp: 18  Temp: 98.4 F (36.9 C)  SpO2: 98%   Filed Weights   11/28/19 1343  Weight: (!) 302 lb 14.4 oz (137.4 kg)   Exam was given in a chair   GENERAL:alert, in no acute distress and comfortable SKIN: no acute rashes, no significant lesions EYES: conjunctiva are pink and non-injected, sclera anicteric OROPHARYNX: MMM, no exudates, no oropharyngeal erythema or ulceration NECK: supple, no JVD LYMPH:  no palpable lymphadenopathy in the cervical, axillary or inguinal regions LUNGS: clear to auscultation b/l with normal respiratory effort HEART: regular rate & rhythm ABDOMEN:  normoactive bowel sounds , non tender, not distended. Extremity: no pedal edema PSYCH: alert & oriented x 3 with fluent speech NEURO: no focal motor/sensory deficits  LABORATORY DATA:   I have reviewed the data as listed CBC Latest Ref Rng & Units 08/22/2019 05/24/2019 03/11/2019  WBC 4.0 - 10.5 K/uL 6.1 7.2 5.8  Hemoglobin 13.0 - 17.0 g/dL 12.1(L) 12.2(L) 12.3(L)  Hematocrit 39 - 52 % 36.8(L) 36.0(L) 38.6(L)  Platelets 150 - 400 K/uL 264 283 271   CBC    Component Value Date/Time   WBC 6.1 08/22/2019 1325   WBC 7.2 05/24/2019 1131   RBC 3.92 (L) 08/22/2019 1325   HGB 12.1 (L) 08/22/2019 1325   HCT 36.8 (L) 08/22/2019 1325   PLT 264 08/22/2019 1325   MCV 93.9 08/22/2019 1325   MCV 87.3 12/22/2017 1518  MCH 30.9 08/22/2019 1325   MCHC 32.9 08/22/2019 1325   RDW 13.6 08/22/2019 1325   LYMPHSABS 2.2 08/22/2019 1325   MONOABS 0.6 08/22/2019 1325   EOSABS 0.1 08/22/2019 1325   BASOSABS 0.0 08/22/2019 1325    CMP Latest Ref Rng & Units 08/22/2019 08/21/2019 05/24/2019    Glucose 70 - 99 mg/dL 194(H) 122(H) 126(H)  BUN 8 - 23 mg/dL 13 17 14   Creatinine 0.61 - 1.24 mg/dL 1.40(H) 1.10 1.03  Sodium 135 - 145 mmol/L 142 143 143  Potassium 3.5 - 5.1 mmol/L 4.2 4.3 3.8  Chloride 98 - 111 mmol/L 108 106 111  CO2 22 - 32 mmol/L 26 24 23   Calcium 8.9 - 10.3 mg/dL 9.1 9.7 8.6(L)  Total Protein 6.5 - 8.1 g/dL 6.9 7.0 7.1  Total Bilirubin 0.3 - 1.2 mg/dL 0.5 0.4 0.4  Alkaline Phos 38 - 126 U/L 58 66 75  AST 15 - 41 U/L 13(L) 19 14(L)  ALT 0 - 44 U/L 13 16 13    . Lab Results  Component Value Date   LDH 185 08/22/2019    01/02/18 Left Inguinal LN Bx:   12/26/17 Tissue Flow Cytometry:   12/26/17 Inguinal Core biopsy:    RADIOGRAPHIC STUDIES: I have personally reviewed the radiological images as listed and agreed with the findings in the report. No results found.  ASSESSMENT & PLAN:   This is a pleasant 64 y.o. 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  CT of the abdomen and pelvis performed on 12/22/2017 showed bulky left inguinal, left hemipelvic, and retroperitoneal adenopathy.   01/02/18 Left inguinal LN Biopsy revealed T-Cell/histocyte rich Large B-Cell Lymphoma  12/27/17 ECHO revealed LV EF of 55-60%   01/05/18 PET/CT revealedMassively enlarged pelvic lymph nodes intense metabolic activity consistent lymphoma. 2. Additional hypermetabolic lymph nodes in the porta hepatis and retroperitoneum LEFT aorta. 3. Solitary hypermetabolic mediastinal lymph node in the upper LEFT Mediastinum. 4. 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 5. Normal spleen.  HIV non reactive on 12/22/2017.Hep C and hep B serology negative.  03/14/18 PET/CT  revealedPET-CT findings suggest an excellent response to chemotherapy. The abdominal lymphadenopathy has near completely resolved and demonstrates a near complete metabolic response. The pelvic and inguinal adenopathy has significantly decreased in size and the metabolic activity has significantly decreased. 2. Diffuse marrow activity likely due to chemotherapy and or marrow stimulating drugs. I do not see any discrete persistent lesions.   04/17/18 CT Head revealedSubtle mesial caudothalamic hypodensities may be artifact though, the could reflect encephalitis or Wernicke's encephalopathy. Consider MRI of the head with and without contrast. 2. Mild chronic small vessel ischemic changes  S/p 6 cycles of EPOCH-R completed on 06/01/18  06/28/18 PET/CT revealed Continued good response to treatment. No residual measurable or hypermetabolic abdominal lymphadenopathy and no recurrent osseous disease. 2. Interval decrease in size of the left operator region lymph node and also the left inguinal lymph node. However, the both have small foci of slightly increased hypermetabolism which bears surveillance. 3. No new or progressive lymphadenopathy in the neck, chest, abdomen or pelvis.  S/p 39.6 Gy in 22 fractions between 07/25/18 and 08/23/18  02/13/2019 CT C/A/P ([id number]) ([id number]) revealed "1. Response to therapy of pelvic adenopathy compared to the PET of 06/28/2018. 2. No new or progressive disease.  3. Mild prostatomegaly. 4. 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 5. Hepatomegaly."  2) left lower extremity swelling- now resolved Doppler ultrasound for DVT was negative in the left lower extremity.  Likely from venous compression +/- lymphatic obstruction from bulky left inguinal, left hemipelvic, and retroperitoneal adenopathy.   3) S/p Port a cath placement - removed 03/11/2019  4)h/oE.coli UTI - Pansensitive -Resolved.Also appearedto  have BPH like symptomatology. -Was hospitalized 04/16/18 to 04/20/18 for E.coli sepsis from UTI, treated with antibiotics.  5) s/p Symptomatic Anemia Hgb 6.7 - due to chemotherapy/sepsis- s/p PRBC transfusion. No overt evidence of bleeding. hgb now stable at 10.6  6) HTN-was elevated in setting of improved po intake and steroids -improving control -continue f/u with PCP  8) Likely BPH - on flomax  PLAN: -Discussed pt labwork today, 11/28/19;  of CBC w/diff and CMP is as follows: all values are WNL except for RBC at 4.09, Hemoglobin at 12.3, HCT at 37.3, Glucose at 114 -Discussed 11/28/19 of LDH at 161: WNL -Pt is > 1 year out from last treatment on 06/01/2018 -No clinical or laboratory evidence of new or progressive Lymphoma -Recommends smoking cessation  -Recommends f/u with PCP -Recommended that the pt continue to eat well, drink at least 48-64 oz of water each day, and walk 20-30 minutes each day. -Recommends getting Shingrix vaccine  -Will see back in 3 months  FOLLOW UP: RTC with Dr Irene Limbo with labs in 3 months  The total time spent in the appt was 20 minutes and more than 50% was on counseling and direct patient cares.  All of the patient's questions were answered with apparent satisfaction. The patient knows to call the clinic with any problems, questions or concerns.  Sullivan Lone MD [place] AAHIVMS [facility] Hematology/Oncology Physician [facility]  (Office):       [phone] (Work cell):  [phone] (Fax):           [phone]  I, Dawayne Cirri am acting as a scribe for Dr. Sullivan Lone.   .I have reviewed the above documentation for accuracy and completeness, and I agree with the above. Brunetta Genera MD

## 2019-11-28 ENCOUNTER — Other Ambulatory Visit: Payer: Self-pay

## 2019-11-28 ENCOUNTER — Inpatient Hospital Stay: Payer: 59 | Admitting: Hematology

## 2019-11-28 ENCOUNTER — Inpatient Hospital Stay: Payer: 59 | Attending: Hematology

## 2019-11-28 VITALS — BP 145/79 | HR 90 | Temp 98.4°F | Resp 18 | Ht 72.0 in | Wt 302.9 lb

## 2019-11-28 DIAGNOSIS — I10 Essential (primary) hypertension: Secondary | ICD-10-CM | POA: Insufficient documentation

## 2019-11-28 DIAGNOSIS — Z86718 Personal history of other venous thrombosis and embolism: Secondary | ICD-10-CM | POA: Diagnosis not present

## 2019-11-28 DIAGNOSIS — Z79899 Other long term (current) drug therapy: Secondary | ICD-10-CM | POA: Diagnosis not present

## 2019-11-28 DIAGNOSIS — C8330 Diffuse large B-cell lymphoma, unspecified site: Secondary | ICD-10-CM

## 2019-11-28 DIAGNOSIS — Z9221 Personal history of antineoplastic chemotherapy: Secondary | ICD-10-CM | POA: Insufficient documentation

## 2019-11-28 DIAGNOSIS — Z8572 Personal history of non-Hodgkin lymphomas: Secondary | ICD-10-CM | POA: Insufficient documentation

## 2019-11-28 LAB — CBC WITH DIFFERENTIAL/PLATELET
Abs Immature Granulocytes: 0.03 10*3/uL (ref 0.00–0.07)
Basophils Absolute: 0 10*3/uL (ref 0.0–0.1)
Basophils Relative: 1 %
Eosinophils Absolute: 0.1 10*3/uL (ref 0.0–0.5)
Eosinophils Relative: 2 %
HCT: 37.3 % — ABNORMAL LOW (ref 39.0–52.0)
Hemoglobin: 12.3 g/dL — ABNORMAL LOW (ref 13.0–17.0)
Immature Granulocytes: 0 %
Lymphocytes Relative: 40 %
Lymphs Abs: 2.9 10*3/uL (ref 0.7–4.0)
MCH: 30.1 pg (ref 26.0–34.0)
MCHC: 33 g/dL (ref 30.0–36.0)
MCV: 91.2 fL (ref 80.0–100.0)
Monocytes Absolute: 0.6 10*3/uL (ref 0.1–1.0)
Monocytes Relative: 8 %
Neutro Abs: 3.6 10*3/uL (ref 1.7–7.7)
Neutrophils Relative %: 49 %
Platelets: 295 10*3/uL (ref 150–400)
RBC: 4.09 MIL/uL — ABNORMAL LOW (ref 4.22–5.81)
RDW: 13.6 % (ref 11.5–15.5)
WBC: 7.2 10*3/uL (ref 4.0–10.5)
nRBC: 0 % (ref 0.0–0.2)

## 2019-11-28 LAB — CMP (CANCER CENTER ONLY)
ALT: 17 U/L (ref 0–44)
AST: 16 U/L (ref 15–41)
Albumin: 4.2 g/dL (ref 3.5–5.0)
Alkaline Phosphatase: 53 U/L (ref 38–126)
Anion gap: 9 (ref 5–15)
BUN: 19 mg/dL (ref 8–23)
CO2: 28 mmol/L (ref 22–32)
Calcium: 9.3 mg/dL (ref 8.9–10.3)
Chloride: 103 mmol/L (ref 98–111)
Creatinine: 1.05 mg/dL (ref 0.61–1.24)
GFR, Est AFR Am: >60 mL/min (ref >60)
GFR, Estimated: >60 mL/min (ref >60)
Glucose, Bld: 114 mg/dL — ABNORMAL HIGH (ref 70–99)
Potassium: 3.9 mmol/L (ref 3.5–5.1)
Sodium: 140 mmol/L (ref 135–145)
Total Bilirubin: 0.9 mg/dL (ref 0.3–1.2)
Total Protein: 7.2 g/dL (ref 6.5–8.1)

## 2019-11-29 LAB — LACTATE DEHYDROGENASE: LDH: 161 U/L (ref 98–192)

## 2019-12-02 ENCOUNTER — Telehealth: Payer: Self-pay | Admitting: Hematology

## 2019-12-02 NOTE — Telephone Encounter (Signed)
Scheduled per 07/22 los, patient has been called and notified.

## 2019-12-05 ENCOUNTER — Other Ambulatory Visit: Payer: Self-pay

## 2019-12-05 ENCOUNTER — Encounter: Payer: Self-pay | Admitting: Emergency Medicine

## 2019-12-05 ENCOUNTER — Ambulatory Visit
Admission: EM | Admit: 2019-12-05 | Discharge: 2019-12-05 | Disposition: A | Discharge status: Home / Self Care | Payer: 59 | Attending: Physician Assistant | Admitting: Physician Assistant

## 2019-12-05 DIAGNOSIS — R05 Cough: Secondary | ICD-10-CM

## 2019-12-05 DIAGNOSIS — J3489 Other specified disorders of nose and nasal sinuses: Secondary | ICD-10-CM

## 2019-12-05 DIAGNOSIS — R0981 Nasal congestion: Secondary | ICD-10-CM

## 2019-12-05 DIAGNOSIS — R059 Cough, unspecified: Secondary | ICD-10-CM

## 2019-12-05 MED ORDER — PREDNISONE 50 MG PO TABS
50.0000 mg | ORAL_TABLET | Freq: Every day | ORAL | 0 refills | Status: DC
Start: 2019-12-05 — End: 2020-03-12

## 2019-12-05 MED ORDER — ALBUTEROL SULFATE HFA 108 (90 BASE) MCG/ACT IN AERS: 1.0000 | INHALATION_SPRAY | Freq: Four times a day (QID) | RESPIRATORY_TRACT | 0 refills | Status: DC | PRN

## 2019-12-05 NOTE — ED Provider Notes (Signed)
[facility]    CSN: [id number] Arrival date & time: 12/05/19  1352      History   Chief Complaint Chief Complaint  Patient presents with   Cough    HPI Jesus [name] is a 64 y.o. male.   64 year old male comes in for 3 day of URI symptoms. Sore throat, cough, rhinorrhea, nasal congestion. Had lost his voice, but this had resolved. Cough worse at night with some wheezing. No shortness of breath. Denies fever, chills, body aches. Denies abdominal pain, nausea, vomiting, diarrhea. Denies  loss of taste/smell.      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- last radiation 06-2018   Cataract    removed both eyes with l;ens implants    Family history of colon cancer    in his brother- dx'd age 56    History of chemotherapy    last 06-01-2018   History of kidney stones    History of radiation therapy    last radiation 06-2018   Hyperlipidemia    currently under control   Hypertension    Irregular heart beats    Lymphadenopathy    Pain, lower leg    Bilateral   Peripheral arterial disease (HCC)    Pre-diabetes    Red-green color blindness    RLS (restless legs syndrome)    Snores    Wears glasses     Patient Active Problem List   Diagnosis Date Noted   Chronic pain of right knee 10/17/2018   Large cell (diffuse) non-Hodgkin's lymphoma ([place]) 05/10/2018   Bacteremia due to Escherichia coli    HTN (hypertension) 04/17/2018   RLS (restless legs syndrome) 04/17/2018   Abnormal LFTs 65/99/3570   Acute metabolic encephalopathy 17/79/3903   AKI (acute kidney injury) ([place]) 04/16/2018   Dehydration    Fever, unspecified    Sepsis ([place])    Disorientation    Hypokalemia    Hypomagnesemia    Anemia    Encounter for  antineoplastic chemotherapy    At high risk of tumor lysis syndrome    Swelling of lower leg    Diffuse large B cell lymphoma ([place]) 01/15/2018   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[place]) 01/12/2018   Counseling regarding advance care planning and goals of care 01/12/2018   Bilateral leg pain 05/27/2014    Past Surgical History:  Procedure Laterality Date   CATARACT EXTRACTION W/ INTRAOCULAR LENS  IMPLANT, BILATERAL     COLONOSCOPY     dislodged salava stone     FRACTURE SURGERY     HAND ARTHROPLASTY  1995   crushed left hand   INGUINAL LYMPH NODE BIOPSY Left 01/02/2018   Procedure: LEFT INGUINAL LYMPH NODE BIOPSY;  Surgeon: Rolm Bookbinder, MD;  Location: [facility];  Service: General;  Laterality: Left;   IR IMAGING GUIDED PORT INSERTION  01/15/2018   IR REMOVAL TUN ACCESS W/ PORT W/O FL MOD SED  03/11/2019   MICROLARYNGOSCOPY Left 01/17/2014   Procedure: MICROLARYNGOSCOPY WITH EXCISION OF THE BIOPSY OF LEFT VOCAL CORD LESION;  Surgeon: Izora Gala, MD;  Location: [facility];  Service: ENT;  Laterality: Left;   ORIF FOOT FRACTURE  2005  left   REFRACTIVE SURGERY Right    removed cloudiness in right eye after cataract removal        Home Medications    Prior to Admission medications   Medication Sig Start Date End Date Taking? Authorizing Provider  tadalafil (CIALIS) 5 MG tablet Take 0.5-1 tablets (2.5-5 mg total) by mouth daily as needed for erectile dysfunction. 08/21/19  Yes Wendie Agreste, MD  albuterol (VENTOLIN HFA) 108 (90 Base) MCG/ACT inhaler Inhale 1-2 puffs into the lungs every 6 (six) hours as needed for wheezing or shortness of breath. 12/05/19   Tasia Catchings, Tiannah Greenly V, PA-C  amLODipine (NORVASC) 10 MG tablet Take 1 tablet (10 mg total) by mouth daily. 08/21/19   Wendie Agreste, MD  aspirin 81 MG tablet Take 81 mg by mouth daily.    [provider]  B-COMPLEX-C PO Take by mouth.    [provider]    glucosamine-chondroitin 500-400 MG tablet Take 1 tablet by mouth 3 (three) times daily.    [provider]  lisinopril (ZESTRIL) 20 MG tablet Take 1 tablet (20 mg total) by mouth daily. 08/21/19   Wendie Agreste, MD  Multiple Vitamins-Minerals (CENTRUM SILVER 50+MEN PO) Take by mouth.    [provider]  predniSONE (DELTASONE) 50 MG tablet Take 1 tablet (50 mg total) by mouth daily with breakfast. 12/05/19   Tasia Catchings, Ceci Taliaferro V, PA-C  triamcinolone ointment (KENALOG) 0.5 % Apply 1 application topically 2 (two) times daily. 07/30/19   Hall-Potvin, [place], PA-C    Family History Family History  Problem Relation Age of Onset   Breast cancer Mother    Diabetes Father    Hypertension Father    Stroke Father    Mental illness Sister    Hypertension Daughter    Mental illness Daughter    Hypertension Brother    Colon cancer Brother 60       passed away December 26, 2018   Breast cancer Sister    Esophageal cancer Neg Hx    Colon polyps Neg Hx    Rectal cancer Neg Hx    Stomach cancer Neg Hx     Social History Social History   Tobacco Use   Smoking status: Current Every Day Smoker    Packs/day: 0.50    Years: 36.00    Pack years: 18.00    Types: Cigarettes   Smokeless tobacco: Never Used   Tobacco comment: he denies smoking in about 2 weeks 07/13/18  Vaping Use   Vaping Use: Never used  Substance Use Topics   Alcohol use: Yes    Alcohol/week: 11.0 standard drinks    Types: 5 Shots of liquor, 6 Cans of beer per week    Comment: weekends, he denies current alcohol use 07/13/18   Drug use: Not Currently    Types: Cocaine    Comment: reports cocaine usage ~2X/ month; last use 12/26/17     Allergies   Bee venom   Review of Systems Review of Systems  Reason unable to perform ROS: See HPI as above.     Physical Exam Triage Vital Signs ED Triage Vitals  Enc Vitals Group     BP 12/05/19 1428 (!) 150/89     Pulse Rate 12/05/19 1428 85     Resp 12/05/19  1428 18     Temp 12/05/19 1428 98 F (36.7 C)     Temp Source 12/05/19 1428 Oral     SpO2 12/05/19 1428 94 %     Weight --  Height --      Head Circumference --      Peak Flow --      Pain Score 12/05/19 1427 7     Pain Loc --      Pain Edu? --      Excl. in [place]? --    No data found.  Updated Vital Signs BP (!) 150/89 (BP Location: Right Arm)    Pulse 85    Temp 98 F (36.7 C) (Oral)    Resp 18    SpO2 94%   Visual Acuity Right Eye Distance:   Left Eye Distance:   Bilateral Distance:    Right Eye Near:   Left Eye Near:    Bilateral Near:     Physical Exam Constitutional:      General: He is not in acute distress.    Appearance: He is well-developed. He is not ill-appearing, toxic-appearing or diaphoretic.  HENT:     Head: Normocephalic and atraumatic.     Right Ear: Tympanic membrane, ear canal and external ear normal. Tympanic membrane is not erythematous or bulging.     Left Ear: Tympanic membrane, ear canal and external ear normal. Tympanic membrane is not erythematous or bulging.     Nose:     Right Sinus: No maxillary sinus tenderness or frontal sinus tenderness.     Left Sinus: No maxillary sinus tenderness or frontal sinus tenderness.     Mouth/Throat:     Mouth: Mucous membranes are moist.     Pharynx: Oropharynx is clear. Uvula midline.  Eyes:     Conjunctiva/sclera: Conjunctivae normal.     Pupils: Pupils are equal, round, and reactive to light.  Cardiovascular:     Rate and Rhythm: Normal rate and regular rhythm.  Pulmonary:     Effort: Pulmonary effort is normal. No accessory muscle usage, prolonged expiration, respiratory distress or retractions.     Breath sounds: No decreased air movement or transmitted upper airway sounds. No decreased breath sounds.     Comments: Distant lung sounds. Otherwise LCTAB Musculoskeletal:     Cervical back: Normal range of motion and neck supple.  Skin:    General: Skin is warm and dry.  Neurological:     Mental  Status: He is alert and oriented to person, place, and time.      UC Treatments / Results  Labs (all labs ordered are listed, but only abnormal results are displayed) Labs Reviewed  NOVEL CORONAVIRUS, NAA    EKG   Radiology No results found.  Procedures Procedures (including critical care time)  Medications Ordered in UC Medications - No data to display  Initial Impression / Assessment and Plan / UC Course  I have reviewed the triage vital signs and the nursing notes.  Pertinent labs & imaging results that were available during my care of the patient were reviewed by me and considered in my medical decision making (see chart for details).    Discussed likely some component of COPD given smoking history, distant lung sounds.  However, currently lungs clear to auscultation bilaterally, stable O2 sat.  Will treat symptomatically with prednisone, albuterol.  Covid testing ordered, patient to quarantine until testing results return.  Other symptomatic treatment discussed.  Return precautions given.  Patient expresses understanding and agrees to plan.  Final Clinical Impressions(s) / UC Diagnoses   Final diagnoses:  Cough  Rhinorrhea  Nasal congestion   ED Prescriptions    Medication Sig Dispense Auth. Provider   predniSONE (DELTASONE)  50 MG tablet Take 1 tablet (50 mg total) by mouth daily with breakfast. 5 tablet Mehkai Gallo V, PA-C   albuterol (VENTOLIN HFA) 108 (90 Base) MCG/ACT inhaler Inhale 1-2 puffs into the lungs every 6 (six) hours as needed for wheezing or shortness of breath. 8 g Ok Edwards, PA-C     I have reviewed the PDMP during this encounter.   Ok Edwards, PA-C 12/05/19 1453

## 2019-12-05 NOTE — ED Triage Notes (Signed)
Pt presents to [facility] for assessment of sore throat and cough x 3 days.

## 2019-12-05 NOTE — Discharge Instructions (Signed)
COVID PCR testing ordered. I would like you to quarantine until testing results. Prednisone for cough. Albuterol as needed for cough/wheezing. You can take over the counter flonase/nasacort to help with nasal congestion/drainage. Tylenol/motrin for pain and fever. Keep hydrated, urine should be clear to pale yellow in color. If experiencing shortness of breath, trouble breathing, go to the emergency department for further evaluation needed.

## 2019-12-05 NOTE — ED Notes (Signed)
Patient able to ambulate independently  

## 2019-12-06 LAB — NOVEL CORONAVIRUS, NAA: SARS-CoV-2, NAA: NOT DETECTED

## 2019-12-06 LAB — SARS-COV-2, NAA 2 DAY TAT

## 2020-01-07 ENCOUNTER — Encounter: Payer: Self-pay | Admitting: Hematology

## 2020-01-14 ENCOUNTER — Other Ambulatory Visit: Payer: Self-pay | Admitting: Physician Assistant

## 2020-01-14 DIAGNOSIS — R222 Localized swelling, mass and lump, trunk: Secondary | ICD-10-CM

## 2020-01-16 ENCOUNTER — Ambulatory Visit
Admission: RE | Admit: 2020-01-16 | Discharge: 2020-01-16 | Disposition: A | Discharge status: Home / Self Care | Payer: 59 | Source: Ambulatory Visit | Attending: Physician Assistant | Admitting: Physician Assistant

## 2020-01-16 DIAGNOSIS — R222 Localized swelling, mass and lump, trunk: Secondary | ICD-10-CM

## 2020-01-16 IMAGING — US US SOFT TISSUE
1 series · 14 of 16 positions shown · non-contrast
Comparison: Prior chest CT from [DATE].

CLINICAL DATA: Initial evaluation for left chest wall mass.

EXAM:
ULTRASOUND OF HEAD/NECK SOFT TISSUES
TECHNIQUE: Ultrasound examination of the head and neck soft tissues was
performed in the area of clinical concern.

[Series 1: us soft tissue · 0.09mm/px · 14 of 24 slices shown]
[im 1/24]
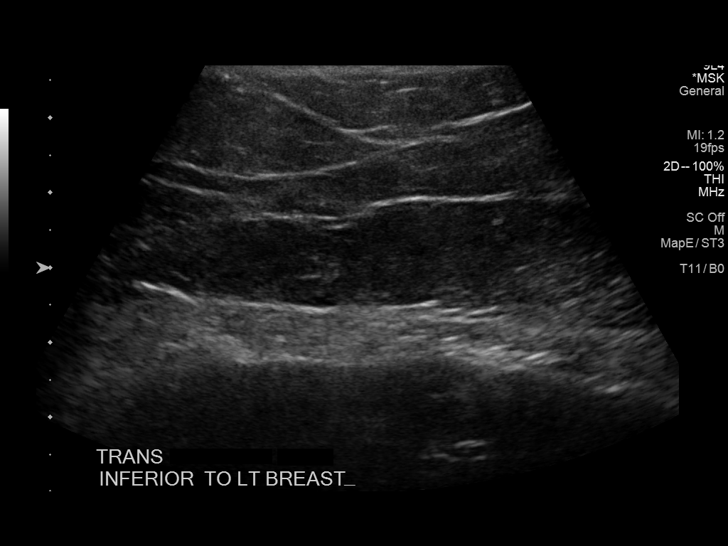
[im 2/24]
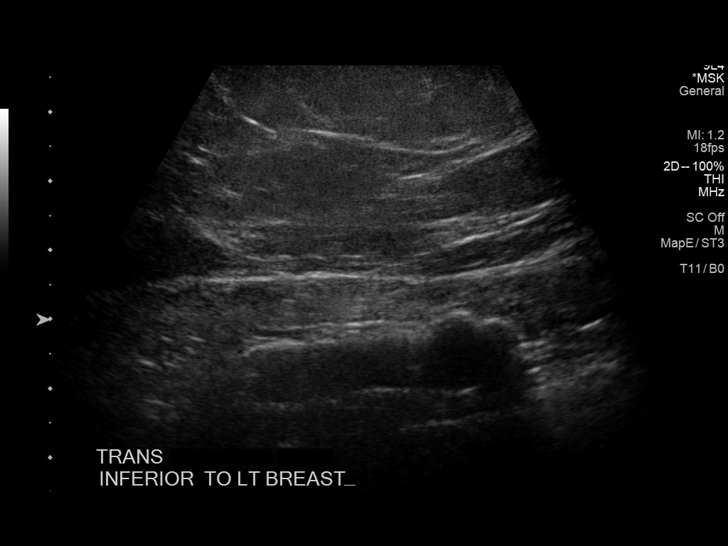
[im 4/24]
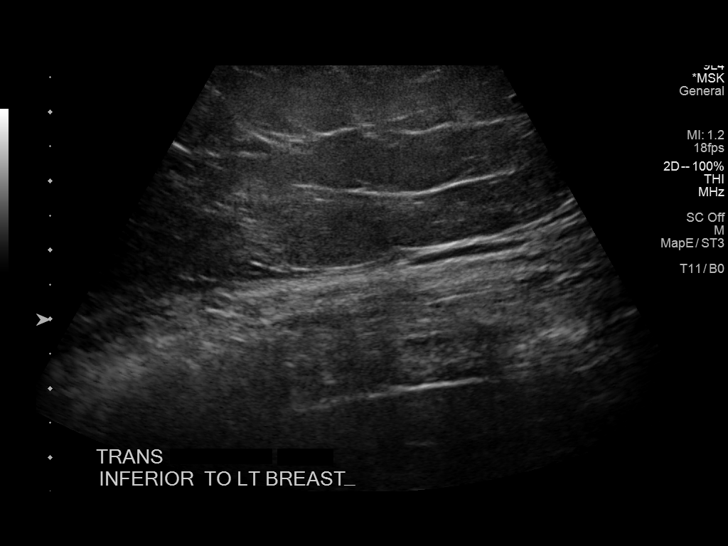
[im 7/24]
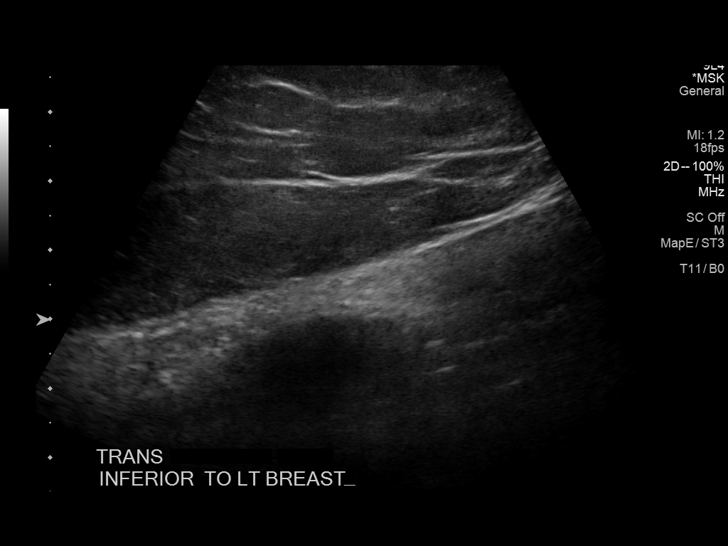
[im 8/24]
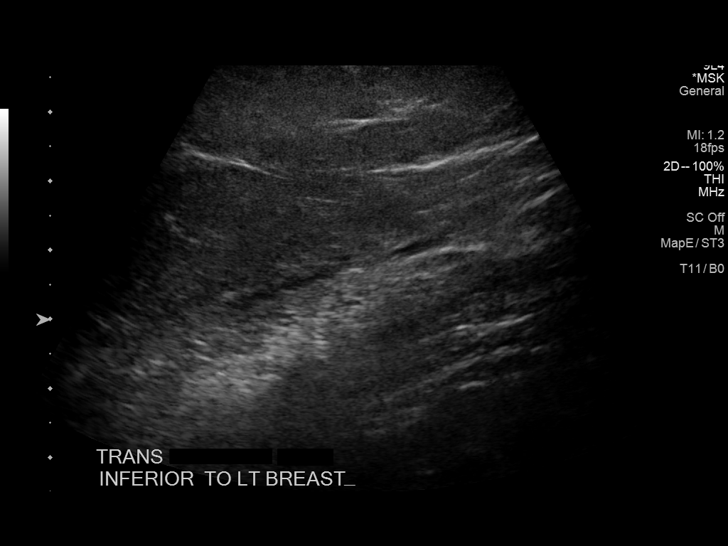
[im 10/24]
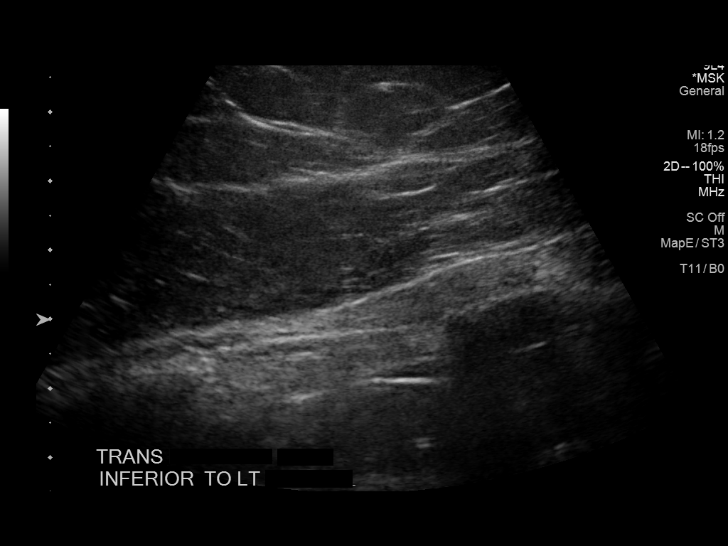
[im 11/24]
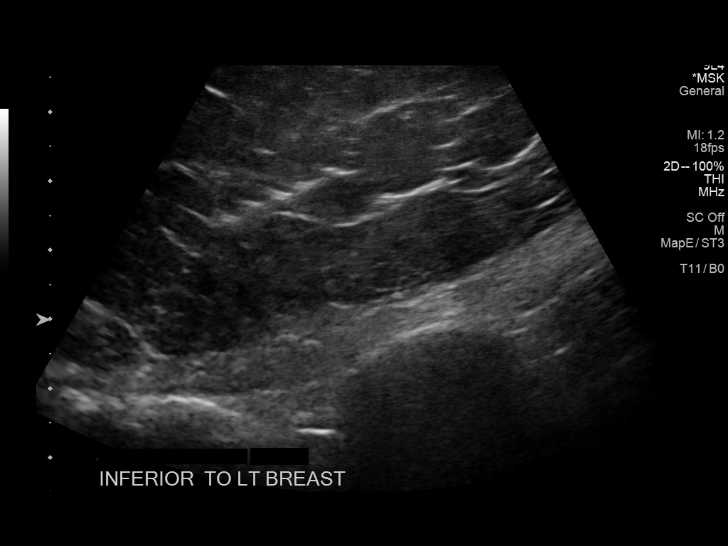
[im 13/24]
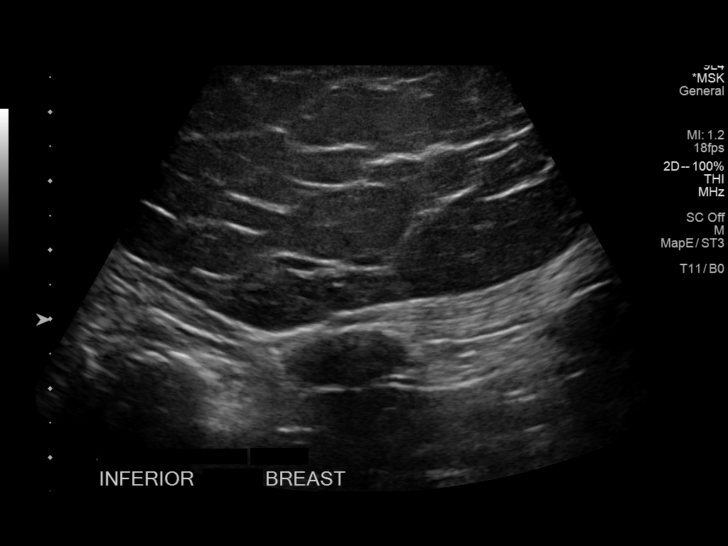
[im 14/24]
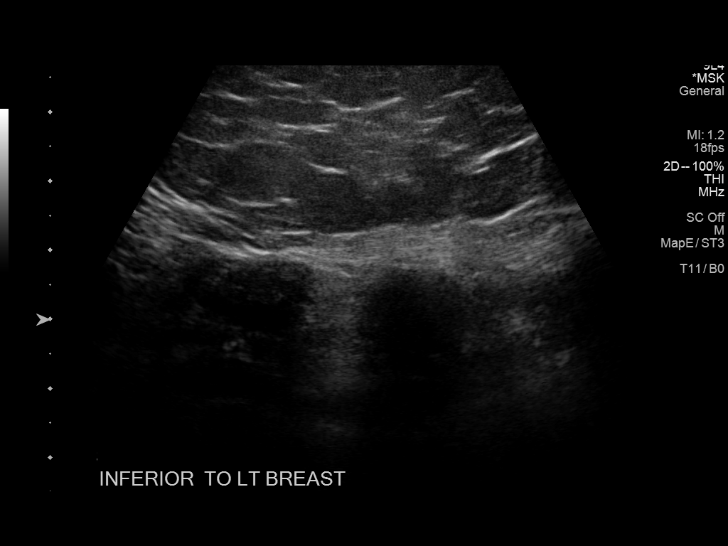
[im 16/24]
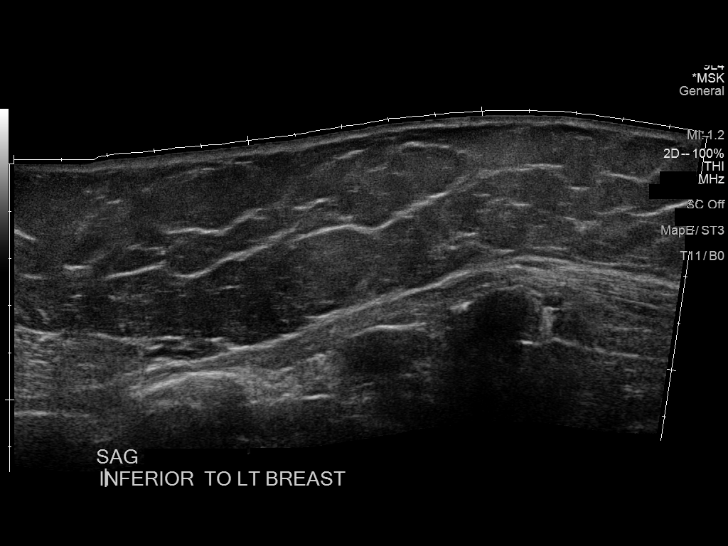
[im 19/24]
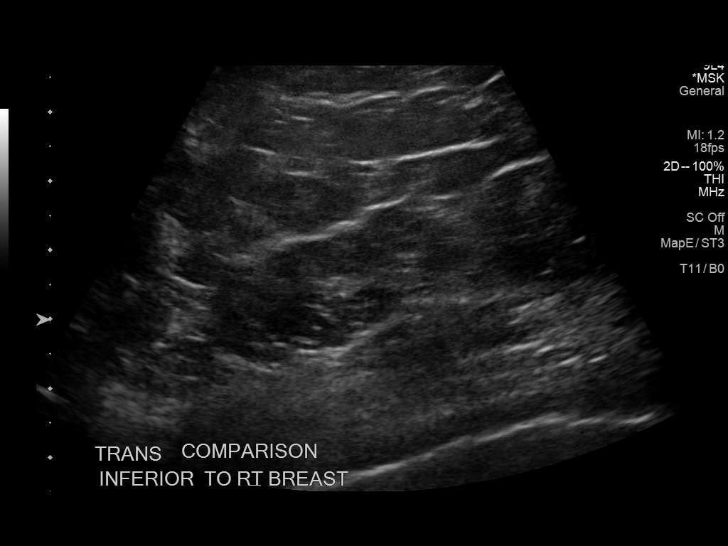
[im 20/24]
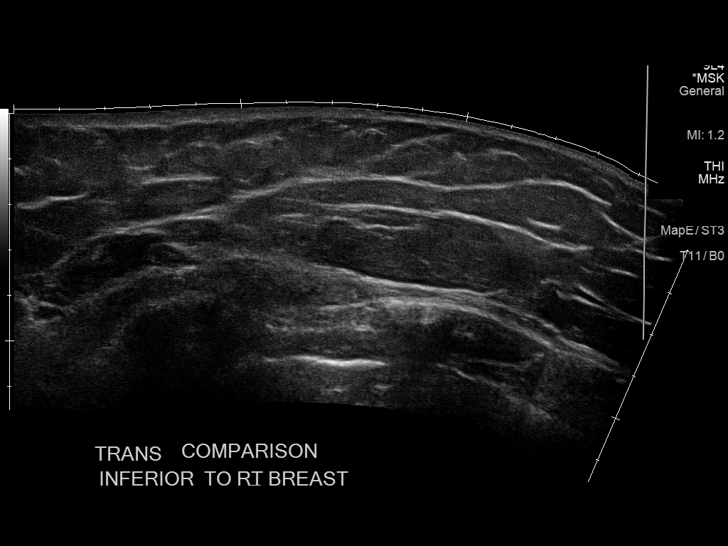
[im 22/24]
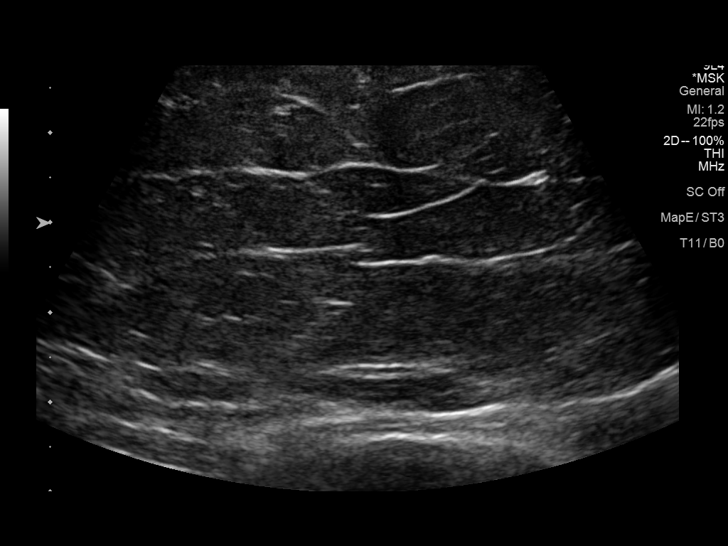
[im 24/24]
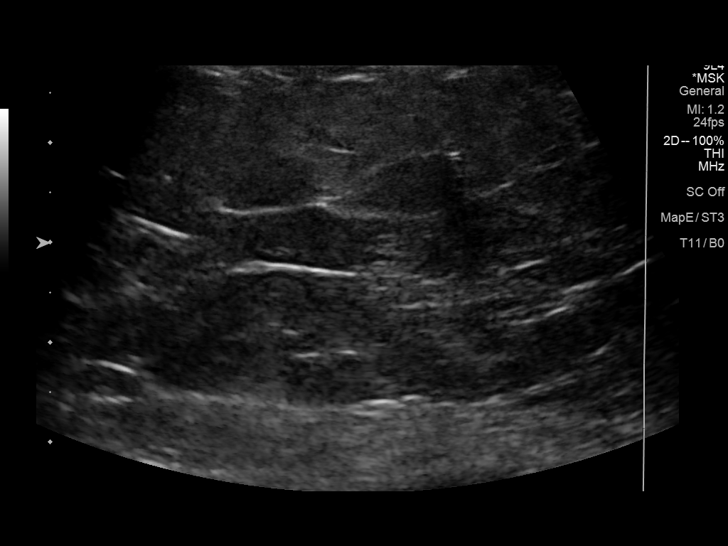

[14 of 16 positions shown; findings below may reference images not displayed]

FINDINGS: Targeted ultrasound of a palpable abnormality of concern at the
anterior left chest wall was performed, just inferior to the nipple.
Ultrasound demonstrates mildly prominent adipose tissue throughout
the anterior left chest wall. Visualized soft tissues demonstrated
normal appearance. No discrete lipoma or other soft tissue mass. No
loculated collection, solid mass lesion, or significant inflammatory
changes. The visualized underlying vasculature is normal in
appearance. No abnormality about the overlying skin. No other
concerning features.
IMPRESSION: Negative ultrasound of the anterior left chest wall demonstrating
mildly prominent adipose tissue without discrete lipoma or other
soft tissue mass. If there is continued clinical concern for a
possible underlying occult lesion, further assessment with dedicated
cross-sectional imaging could be performed.

## 2020-01-21 ENCOUNTER — Other Ambulatory Visit: Payer: Self-pay

## 2020-01-21 ENCOUNTER — Ambulatory Visit: Payer: 59 | Admitting: Podiatry

## 2020-01-21 DIAGNOSIS — M79676 Pain in unspecified toe(s): Secondary | ICD-10-CM | POA: Diagnosis not present

## 2020-01-21 DIAGNOSIS — B351 Tinea unguium: Secondary | ICD-10-CM

## 2020-01-22 NOTE — Progress Notes (Signed)
He presents today chief complaint of painful ingrown toenails to the hallux bilaterally.  Objective: Toenails are long thick yellow dystrophic with mycotic sharply incurvated along the hallux bilaterally.  Pulses are palpable no open lesions or wounds.  Tendons are thick yellow dystrophic possibly mycotic.  Assessment: Pain in limb secondary to ingrown nails hallux bilateral and thick mycotic nails.  Plan: Debridement of toenails 1 through 5 bilateral discussed the possible procedure for permanent matrixectomy's he states that he does not want to do that right now he is about to go to [place].

## 2020-02-19 ENCOUNTER — Telehealth: Payer: 59 | Admitting: Family Medicine

## 2020-02-19 ENCOUNTER — Other Ambulatory Visit: Payer: Self-pay | Admitting: Family Medicine

## 2020-02-19 DIAGNOSIS — I10 Essential (primary) hypertension: Secondary | ICD-10-CM

## 2020-02-19 NOTE — Telephone Encounter (Signed)
Requested Prescriptions  Pending Prescriptions Disp Refills  . lisinopril (ZESTRIL) 20 MG tablet [Pharmacy Med Name: LISINOPRIL 20 MG TABLET] 30 tablet 0    Sig: TAKE 1 TABLET BY MOUTH EVERY DAY     Cardiovascular:  ACE Inhibitors Failed - 02/19/2020  1:30 AM      Failed - Last BP in normal range    BP Readings from Last 1 Encounters:  12/05/19 (!) 150/89         Failed - Valid encounter within last 6 months    Recent Outpatient Visits          6 months ago Erectile dysfunction, unspecified erectile dysfunction type   [facility], Ranell Patrick, MD   8 months ago    [facility], Ranell Patrick, MD   1 year ago Annual physical exam   [facility], Ranell Patrick, MD   1 year ago Pain of right tibia   [facility], Ranell Patrick, MD   1 year ago Right knee pain, unspecified chronicity   [facility], Ranell Patrick, MD             Passed - Cr in normal range and within 180 days    Creatinine  Date Value Ref Range Status  11/28/2019 1.05 0.61 - 1.24 mg/dL Final   Creat  Date Value Ref Range Status  04/29/2014 1.09 0.50 - 1.35 mg/dL Final         Passed - K in normal range and within 180 days    Potassium  Date Value Ref Range Status  11/28/2019 3.9 3.5 - 5.1 mmol/L Final         Passed - Patient is not pregnant      . amLODipine (NORVASC) 10 MG tablet [Pharmacy Med Name: AMLODIPINE BESYLATE 10 MG TAB] 90 tablet 1    Sig: TAKE 1 TABLET BY MOUTH EVERY DAY     Cardiovascular:  Calcium Channel Blockers Failed - 02/19/2020  1:30 AM      Failed - Last BP in normal range    BP Readings from Last 1 Encounters:  12/05/19 (!) 150/89         Failed - Valid encounter within last 6 months    Recent Outpatient Visits          6 months ago Erectile dysfunction, unspecified erectile dysfunction type   [facility], Ranell Patrick, MD   8 months ago    [facility], Ranell Patrick, MD    1 year ago Annual physical exam   [facility], Ranell Patrick, MD   1 year ago Pain of right tibia   [facility], Ranell Patrick, MD   1 year ago Right knee pain, unspecified chronicity   [facility], Ranell Patrick, MD             One month courtesy refill will reminder for patient to call and schedule 6 month follow-up appointment.

## 2020-02-25 NOTE — Progress Notes (Signed)
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 male is here because of left lower extremity edema and lymphadenopathy.  The patient was seen in the emergency room this past Friday for the same issue.  A CT of the abdomen and pelvis was performed showing bulky left inguinal, left hemipelvic, and retroperitoneal adenopathy.  He was referred to [place] from the emergency room for further evaluation.  Doppler ultrasound of the left lower extremity was performed and was negative for DVT. Patient reports that he has been having left lower extremity edema in his left groin and left leg for approximately 1 month.  He states that the swelling in the left groin started to get better but then worsened.  The left lower extremity edema has slowly worsened over time.  Patient denies having fevers and chills.  He reports that he does have night sweats at times.  He reported having headaches approximate 1 [place]  He thinks his headaches are related to not having his blood pressure medication.  His headaches have now resolved.  He denies visual changes.  The patient denies chest pain, shortness of breath and cough.  No nausea, vomiting, constipation, diarrhea.  Denies abdominal pain.  The patient denies recent weight loss and has actually gained weight recently.  Patient denies epistaxis, bleeding gums, hemoptysis, hematuria, but occasionally, and melena.  He reports increased urinary frequency over the past month but no dysuria.  The patient is here for evaluation and discussion of his recent CT and lab findings.  Interval History:  Jesus [name] returns  today for management and evaluation of his T-Cell/histocyte rich Large B-Cell Lymphoma. The patient's last visit with [place] was on 11/28/2019. [name].  The pt reports that lumps in his chest have been evaluated and were thought to be "floating fat", not malignancy. Pt has received his Shingles, Pneumonia, and annual flu vaccine. He is interested in receiving his COVID19 booster. He is sure to walk often and works in his yard to maintain his activity level.   Of note since the patient's last visit, pt has had [place] Chest ([id number]) completed on 01/16/2020 with results revealing "Negative ultrasound of the anterior left chest wall demonstrating mildly prominent adipose tissue without discrete lipoma or other soft tissue mass."  Lab results today (02/26/20) of CBC w/diff and CMP is as follows: all values are WNL except for RBC at 4.03, Hgb at 11.9, HCT at 36.6, Lymphs Abs at 4.2K, Glucose at 101, AST at 12. 02/25/2020 LDH at 154  On review of systems, pt denies fevers, chills, night sweats, new lumps/bumps, leg swelling and any other symptoms.    MEDICAL HISTORY:   Past Medical History:  Diagnosis Date  . Allergy   . Anemia    during chemo  . Arthritis    knee   . Blood transfusion without reported diagnosis   . Cancer ([place])    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- last radiation 06-2018  . Cataract    removed both eyes with l;ens implants   .  Family history of colon cancer    in his brother- dx'd age 53   . History of chemotherapy    last 06-01-2018  . History of kidney stones   . History of radiation therapy    last radiation 06-2018  . Hyperlipidemia    currently under control  . Hypertension   . Irregular heart beats   . Lymphadenopathy   . Pain, lower leg    Bilateral  . Peripheral arterial disease ([place])   . Pre-diabetes   . Red-green color blindness   . RLS (restless legs syndrome)   . Snores   . Wears  glasses     SURGICAL HISTORY: Past Surgical History:  Procedure Laterality Date  . CATARACT EXTRACTION W/ INTRAOCULAR LENS  IMPLANT, BILATERAL    . COLONOSCOPY    . dislodged salava stone    . FRACTURE SURGERY    . HAND ARTHROPLASTY  1995   crushed left hand  . INGUINAL LYMPH NODE BIOPSY Left 01/02/2018   Procedure: LEFT INGUINAL LYMPH NODE BIOPSY;  Surgeon: Rolm Bookbinder, MD;  Location: [facility];  Service: General;  Laterality: Left;  . IR IMAGING GUIDED PORT INSERTION  01/15/2018  . IR REMOVAL TUN ACCESS W/ PORT W/O FL MOD SED  03/11/2019  . MICROLARYNGOSCOPY Left 01/17/2014   Procedure: MICROLARYNGOSCOPY WITH EXCISION OF THE BIOPSY OF LEFT VOCAL CORD LESION;  Surgeon: Izora Gala, MD;  Location: [facility];  Service: ENT;  Laterality: Left;  . ORIF FOOT FRACTURE  2005   left  . REFRACTIVE SURGERY Right    removed cloudiness in right eye after cataract removal     SOCIAL HISTORY: Social History   Socioeconomic History  . Marital status: Divorced    Spouse name: Not on file  . Number of children: 3  . Years of education: Not on file  . Highest education level: Not on file  Occupational History  . Not on file  Tobacco Use  . Smoking status: Current Every Day Smoker    Packs/day: 0.50    Years: 36.00    Pack years: 18.00    Types: Cigarettes  . Smokeless tobacco: Never Used  . Tobacco comment: he denies smoking in about 2 weeks 07/13/18  Vaping Use  . Vaping Use: Never used  Substance and Sexual Activity  . Alcohol use: Yes    Alcohol/week: 11.0 standard drinks    Types: 5 Shots of liquor, 6 Cans of beer per week    Comment: weekends, he denies current alcohol use 07/13/18  . Drug use: Not Currently    Types: Cocaine    Comment: reports cocaine usage ~2X/ month; last use 12/26/17  . Sexual activity: Not on file  Other Topics Concern  . Not on file  Social History Narrative  . Not on file   Social Determinants of Health   Financial Resource Strain:    . Difficulty of Paying Living Expenses: Not on file  Food Insecurity:   . Worried About Charity fundraiser in the Last Year: Not on file  . Ran Out of Food in the Last Year: Not on file  Transportation Needs:   . Lack of Transportation (Medical): Not on file  . Lack of Transportation (Non-Medical): Not on file  Physical Activity:   . Days of Exercise per Week: Not on file  . Minutes of Exercise per Session: Not on file  Stress:   . Feeling of Stress : Not on file  Social Connections:   .  Frequency of Communication with Friends and Family: Not on file  . Frequency of Social Gatherings with Friends and Family: Not on file  . Attends Religious Services: Not on file  . Active Member of Clubs or Organizations: Not on file  . Attends Archivist Meetings: Not on file  . Marital Status: Not on file  Intimate Partner Violence:   . Fear of Current or Ex-Partner: Not on file  . Emotionally Abused: Not on file  . Physically Abused: Not on file  . Sexually Abused: Not on file    FAMILY HISTORY: Family History  Problem Relation Age of Onset  . Breast cancer Mother   . Diabetes Father   . Hypertension Father   . Stroke Father   . Mental illness Sister   . Hypertension Daughter   . Mental illness Daughter   . Hypertension Brother   . Colon cancer Brother 85       passed away December 02, 2018  . Breast cancer Sister   . Esophageal cancer Neg Hx   . Colon polyps Neg Hx   . Rectal cancer Neg Hx   . Stomach cancer Neg Hx     ALLERGIES:  is allergic to bee venom.  MEDICATIONS:  Current Outpatient Medications  Medication Sig Dispense Refill  . albuterol (VENTOLIN HFA) 108 (90 Base) MCG/ACT inhaler Inhale 1-2 puffs into the lungs every 6 (six) hours as needed for wheezing or shortness of breath. 8 g 0  . amLODipine (NORVASC) 10 MG tablet TAKE 1 TABLET BY MOUTH EVERY DAY 30 tablet 0  . aspirin 81 MG tablet Take 81 mg by mouth daily.    . B-COMPLEX-C PO Take by mouth.    Marland Kitchen  glucosamine-chondroitin 500-400 MG tablet Take 1 tablet by mouth 3 (three) times daily.    Marland Kitchen lisinopril (ZESTRIL) 20 MG tablet TAKE 1 TABLET BY MOUTH EVERY DAY 30 tablet 0  . Multiple Vitamins-Minerals (CENTRUM SILVER 50+MEN PO) Take by mouth.    . predniSONE (DELTASONE) 50 MG tablet Take 1 tablet (50 mg total) by mouth daily with breakfast. 5 tablet 0  . tadalafil (CIALIS) 5 MG tablet Take 0.5-1 tablets (2.5-5 mg total) by mouth daily as needed for erectile dysfunction. 90 tablet 1  . triamcinolone ointment (KENALOG) 0.5 % Apply 1 application topically 2 (two) times daily. 30 g 0   No current facility-administered medications for this visit.    REVIEW OF SYSTEMS:   A 10+ POINT REVIEW OF SYSTEMS WAS OBTAINED including neurology, dermatology, psychiatry, cardiac, respiratory, lymph, extremities, GI, GU, Musculoskeletal, constitutional, breasts, reproductive, HEENT.  All pertinent positives are noted in the HPI.  All others are negative.   PHYSICAL EXAMINATION: ECOG PERFORMANCE STATUS: 1 - Symptomatic but completely ambulatory  Vitals:   02/26/20 0933  BP: 135/79  Pulse: 87  Resp: 18  Temp: (!) 97.1 F (36.2 C)  SpO2: 100%   Filed Weights   02/26/20 0933  Weight: (!) 306 lb 14.4 oz (139.2 kg)   GENERAL:alert, in no acute distress and comfortable SKIN: no acute rashes, no significant lesions EYES: conjunctiva are pink and non-injected, sclera anicteric OROPHARYNX: MMM, no exudates, no oropharyngeal erythema or ulceration NECK: supple, no JVD LYMPH:  no palpable lymphadenopathy in the cervical, axillary or inguinal regions LUNGS: clear to auscultation b/l with normal respiratory effort HEART: regular rate & rhythm ABDOMEN:  normoactive bowel sounds , non tender, not distended. No palpable hepatosplenomegaly.  Extremity: no pedal edema PSYCH: alert & oriented x 3 with  fluent speech NEURO: no focal motor/sensory deficits  LABORATORY DATA:   I have reviewed the data as  listed CBC Latest Ref Rng & Units 02/26/2020 11/28/2019 08/22/2019  WBC 4.0 - 10.5 K/uL 8.2 7.2 6.1  Hemoglobin 13.0 - 17.0 g/dL 11.9(L) 12.3(L) 12.1(L)  Hematocrit 39 - 52 % 36.6(L) 37.3(L) 36.8(L)  Platelets 150 - 400 K/uL 273 295 264   CBC    Component Value Date/Time   WBC 8.2 02/26/2020 0904   RBC 4.03 (L) 02/26/2020 0904   HGB 11.9 (L) 02/26/2020 0904   HGB 12.1 (L) 08/22/2019 1325   HCT 36.6 (L) 02/26/2020 0904   PLT 273 02/26/2020 0904   PLT 264 08/22/2019 1325   MCV 90.8 02/26/2020 0904   MCV 87.3 12/22/2017 1518   MCH 29.5 02/26/2020 0904   MCHC 32.5 02/26/2020 0904   RDW 13.4 02/26/2020 0904   LYMPHSABS 4.2 (H) 02/26/2020 0904   MONOABS 0.6 02/26/2020 0904   EOSABS 0.2 02/26/2020 0904   BASOSABS 0.1 02/26/2020 0904    CMP Latest Ref Rng & Units 11/28/2019 08/22/2019 08/21/2019  Glucose 70 - 99 mg/dL 114(H) 194(H) 122(H)  BUN 8 - 23 mg/dL 19 13 17   Creatinine 0.61 - 1.24 mg/dL 1.05 1.40(H) 1.10  Sodium 135 - 145 mmol/L 140 142 143  Potassium 3.5 - 5.1 mmol/L 3.9 4.2 4.3  Chloride 98 - 111 mmol/L 103 108 106  CO2 22 - 32 mmol/L 28 26 24   Calcium 8.9 - 10.3 mg/dL 9.3 9.1 9.7  Total Protein 6.5 - 8.1 g/dL 7.2 6.9 7.0  Total Bilirubin 0.3 - 1.2 mg/dL 0.9 0.5 0.4  Alkaline Phos 38 - 126 U/L 53 58 66  AST 15 - 41 U/L 16 13(L) 19  ALT 0 - 44 U/L 17 13 16    . Lab Results  Component Value Date   LDH 161 11/28/2019    01/02/18 Left Inguinal LN Bx:   12/26/17 Tissue Flow Cytometry:   12/26/17 Inguinal Core biopsy:    RADIOGRAPHIC STUDIES: I have personally reviewed the radiological images as listed and agreed with the findings in the report. No results found.  ASSESSMENT & PLAN:   This is a pleasant 64 y.o. 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  CT of the abdomen and pelvis performed on 12/22/2017 showed bulky left inguinal, left hemipelvic, and retroperitoneal adenopathy.   01/02/18 Left inguinal LN Biopsy revealed T-Cell/histocyte rich Large B-Cell Lymphoma  12/27/17 ECHO revealed LV EF of 55-60%   01/05/18 PET/CT revealedMassively enlarged pelvic lymph nodes intense metabolic activity consistent lymphoma. 2. Additional hypermetabolic lymph nodes in the porta hepatis and retroperitoneum LEFT aorta. 3. Solitary hypermetabolic mediastinal lymph node in the upper LEFT Mediastinum. 4. 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 5. Normal spleen.  HIV non reactive on 12/22/2017.Hep C and hep B serology negative.  03/14/18 PET/CT revealedPET-CT findings suggest an excellent response to chemotherapy. The abdominal lymphadenopathy has near completely resolved and demonstrates a near complete metabolic response. The pelvic and inguinal adenopathy has significantly decreased in size and the metabolic activity has significantly decreased. 2. Diffuse marrow activity likely due to chemotherapy and or marrow stimulating drugs. I do not see any discrete persistent lesions.   04/17/18 CT Head revealedSubtle mesial caudothalamic hypodensities may be artifact though, the could  reflect encephalitis or Wernicke's encephalopathy. Consider MRI of the head with and without contrast. 2. Mild chronic small vessel ischemic changes  S/p 6 cycles of EPOCH-R completed on 06/01/18  06/28/18 PET/CT revealed Continued good response to treatment. No residual measurable or hypermetabolic abdominal lymphadenopathy and no recurrent osseous disease. 2. Interval decrease in size of the left operator region lymph node and also the left inguinal lymph node. However, the both have small foci of slightly increased hypermetabolism which bears surveillance. 3. No  new or progressive lymphadenopathy in the neck, chest, abdomen or pelvis.  S/p 39.6 Gy in 22 fractions between 07/25/18 and 08/23/18  02/13/2019 CT C/A/P ([id number]) ([id number]) revealed "1. Response to therapy of pelvic adenopathy compared to the PET of 06/28/2018. 2. No new or progressive disease. 3. Mild prostatomegaly. 4. 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 5. Hepatomegaly."  2) left lower extremity swelling- now resolved Doppler ultrasound for DVT was negative in the left lower extremity.  Likely from venous compression +/- lymphatic obstruction from bulky left inguinal, left hemipelvic, and retroperitoneal adenopathy.   3) S/p Port a cath placement - removed 03/11/2019  4)h/oE.coli UTI - Pansensitive -Resolved.Also appearedto have BPH like symptomatology. -Was hospitalized 04/16/18 to 04/20/18 for E.coli sepsis from UTI, treated with antibiotics.  5) s/p Symptomatic Anemia Hgb 6.7 - due to chemotherapy/sepsis- s/p PRBC transfusion. No overt evidence of bleeding. hgb now stable at 10.6  6) HTN-was elevated in setting of improved po intake and steroids -improving control -continue f/u with PCP  8) Likely BPH - on flomax  PLAN: -Discussed pt labwork today, 02/26/20; blood counts look good, blood chemistries are okay, LDH is WNL. -Discussed 01/16/2020 [place] Chest ([id number]) which revealed adipose tissue - no malignancy. -No lab, clinical, or radiographic evidence of B-Cell Lymphoma recurrence at this time.  -Pt is currently 21 months out from treatment. Will continue watchful observation. No indication to treat.  -If no evidence of disease 2 years post-treatment will move f/u to q51month.  -Discussed CDC guidelines regarding the COVID19 booster.  -Will see back in 3 months with labs    FOLLOW UP: RTC with Dr KIrene Limbowith labs in 3 months   The total time spent in the appt was 20 minutes and more than 50% was  on counseling and direct patient cares.  All of the patient's questions were answered with apparent satisfaction. The patient knows to call the clinic with any problems, questions or concerns.   GSullivan LoneMD MSouth CanalAAHIVMS SKnoxville Area Community HospitalCSansum ClinicHematology/Oncology Physician CKalkaska Memorial [facility] (Office):       [phone](Work cell):  [phone](Fax):           [phone] I, JYevette Edwards am acting as a scribe for Dr. GSullivan Lone   .I have reviewed the above documentation for accuracy and completeness, and I agree with the above. .Brunetta GeneraMD

## 2020-02-26 ENCOUNTER — Other Ambulatory Visit: Payer: Self-pay

## 2020-02-26 ENCOUNTER — Inpatient Hospital Stay: Payer: 59 | Admitting: Hematology

## 2020-02-26 ENCOUNTER — Inpatient Hospital Stay: Payer: 59 | Attending: Hematology

## 2020-02-26 VITALS — BP 135/79 | HR 87 | Temp 97.1°F | Resp 18 | Ht 72.0 in | Wt 306.9 lb

## 2020-02-26 DIAGNOSIS — Z800 Family history of malignant neoplasm of digestive organs: Secondary | ICD-10-CM | POA: Diagnosis not present

## 2020-02-26 DIAGNOSIS — Z923 Personal history of irradiation: Secondary | ICD-10-CM | POA: Diagnosis not present

## 2020-02-26 DIAGNOSIS — Z7982 Long term (current) use of aspirin: Secondary | ICD-10-CM | POA: Diagnosis not present

## 2020-02-26 DIAGNOSIS — Z8572 Personal history of non-Hodgkin lymphomas: Secondary | ICD-10-CM | POA: Diagnosis present

## 2020-02-26 DIAGNOSIS — Z801 Family history of malignant neoplasm of trachea, bronchus and lung: Secondary | ICD-10-CM | POA: Insufficient documentation

## 2020-02-26 DIAGNOSIS — C8330 Diffuse large B-cell lymphoma, unspecified site: Secondary | ICD-10-CM

## 2020-02-26 DIAGNOSIS — I10 Essential (primary) hypertension: Secondary | ICD-10-CM | POA: Diagnosis not present

## 2020-02-26 DIAGNOSIS — Z79899 Other long term (current) drug therapy: Secondary | ICD-10-CM | POA: Insufficient documentation

## 2020-02-26 DIAGNOSIS — Z9221 Personal history of antineoplastic chemotherapy: Secondary | ICD-10-CM | POA: Diagnosis not present

## 2020-02-26 DIAGNOSIS — Z803 Family history of malignant neoplasm of breast: Secondary | ICD-10-CM | POA: Insufficient documentation

## 2020-02-26 DIAGNOSIS — F17210 Nicotine dependence, cigarettes, uncomplicated: Secondary | ICD-10-CM | POA: Diagnosis not present

## 2020-02-26 DIAGNOSIS — Z8249 Family history of ischemic heart disease and other diseases of the circulatory system: Secondary | ICD-10-CM | POA: Insufficient documentation

## 2020-02-26 LAB — CBC WITH DIFFERENTIAL/PLATELET
Abs Immature Granulocytes: 0.05 10*3/uL (ref 0.00–0.07)
Basophils Absolute: 0.1 10*3/uL (ref 0.0–0.1)
Basophils Relative: 1 %
Eosinophils Absolute: 0.2 10*3/uL (ref 0.0–0.5)
Eosinophils Relative: 2 %
HCT: 36.6 % — ABNORMAL LOW (ref 39.0–52.0)
Hemoglobin: 11.9 g/dL — ABNORMAL LOW (ref 13.0–17.0)
Immature Granulocytes: 1 %
Lymphocytes Relative: 50 %
Lymphs Abs: 4.2 10*3/uL — ABNORMAL HIGH (ref 0.7–4.0)
MCH: 29.5 pg (ref 26.0–34.0)
MCHC: 32.5 g/dL (ref 30.0–36.0)
MCV: 90.8 fL (ref 80.0–100.0)
Monocytes Absolute: 0.6 10*3/uL (ref 0.1–1.0)
Monocytes Relative: 7 %
Neutro Abs: 3.3 10*3/uL (ref 1.7–7.7)
Neutrophils Relative %: 39 %
Platelets: 273 10*3/uL (ref 150–400)
RBC: 4.03 MIL/uL — ABNORMAL LOW (ref 4.22–5.81)
RDW: 13.4 % (ref 11.5–15.5)
WBC: 8.2 10*3/uL (ref 4.0–10.5)
nRBC: 0 % (ref 0.0–0.2)

## 2020-02-26 LAB — CMP (CANCER CENTER ONLY)
ALT: 12 U/L (ref 0–44)
AST: 12 U/L — ABNORMAL LOW (ref 15–41)
Albumin: 3.8 g/dL (ref 3.5–5.0)
Alkaline Phosphatase: 60 U/L (ref 38–126)
Anion gap: 8 (ref 5–15)
BUN: 13 mg/dL (ref 8–23)
CO2: 26 mmol/L (ref 22–32)
Calcium: 9.1 mg/dL (ref 8.9–10.3)
Chloride: 107 mmol/L (ref 98–111)
Creatinine: 0.94 mg/dL (ref 0.61–1.24)
GFR, Estimated: >60 mL/min (ref >60)
Glucose, Bld: 101 mg/dL — ABNORMAL HIGH (ref 70–99)
Potassium: 3.8 mmol/L (ref 3.5–5.1)
Sodium: 141 mmol/L (ref 135–145)
Total Bilirubin: 0.3 mg/dL (ref 0.3–1.2)
Total Protein: 6.9 g/dL (ref 6.5–8.1)

## 2020-02-26 LAB — LACTATE DEHYDROGENASE: LDH: 154 U/L (ref 98–192)

## 2020-02-27 ENCOUNTER — Encounter: Payer: Self-pay | Admitting: Orthopaedic Surgery

## 2020-02-27 ENCOUNTER — Ambulatory Visit: Payer: Self-pay

## 2020-02-27 ENCOUNTER — Ambulatory Visit: Payer: 59 | Admitting: Orthopaedic Surgery

## 2020-02-27 DIAGNOSIS — M25561 Pain in right knee: Secondary | ICD-10-CM | POA: Diagnosis not present

## 2020-02-27 DIAGNOSIS — G8929 Other chronic pain: Secondary | ICD-10-CM

## 2020-02-27 DIAGNOSIS — M545 Low back pain, unspecified: Secondary | ICD-10-CM

## 2020-02-27 MED ORDER — HYDROCODONE-ACETAMINOPHEN 5-325 MG PO TABS: 1.0000 | ORAL_TABLET | Freq: Every day | ORAL | 0 refills | Status: DC | PRN

## 2020-02-27 NOTE — Progress Notes (Signed)
Office Visit Note   Patient: Jesus [name]           Date of Birth: 11-Mar-1956           MRN: [id number] Visit Date: 02/27/2020              Requested by: Wendie Agreste, MD [address] [place],  [place] [address] PCP: Maude Leriche, PA-C   Assessment & Plan: Visit Diagnoses:  1. Chronic pain of right knee   2. Low back pain, unspecified back pain laterality, unspecified chronicity, unspecified whether sciatica present     Plan: Impression is bilateral knee advanced generative joint disease with questionable underlying neurogenic claudication as the patient's symptoms are more of fatigue and weakness to both legs rather than pain to the knees themselves.  We will order an MRI to further assess this.  He will follow up with [place] once has been completed.  Have agreed to call in one small prescription of Norco to take very sparingly.  Follow-Up Instructions: Return if symptoms worsen or fail to improve.   Orders:  Orders Placed This Encounter  Procedures  . XR KNEE 3 VIEW RIGHT  . XR Lumbar Spine 2-3 Views   No orders of the defined types were placed in this encounter.     Procedures: No procedures performed   Clinical Data: No additional findings.   Subjective: Chief Complaint  Patient presents with  . Right Knee - Pain    HPI patient is a very pleasant 64 year old gentleman who comes in today with bilateral knee pain right greater than left but actually complaining more of fatigue to both legs.  This has been ongoing for the past 8 to 10 years.  He also has a history of bilateral knee degenerative joint disease for which he has had cortisone and viscosupplementation injections.  He has never had relief of symptoms even for hours following the injections.  Pain he has is to both legs.  No mechanical symptoms.  He does have slight increased pain with turning.  No pain to the lumbar spine.  No bowel or bladder dysfunction.  Over-the-counter medications do not seem to  help.  He has had chemotherapy but this was 2 years ago.  No history of peripheral artery or peripheral vascular disease.    Review of Systems as detailed in HPI.  All others reviewed and are negative.   Objective: Vital Signs: There were no vitals taken for this visit.  Physical Exam well-developed well-nourished gentleman in no acute distress.  Alert oriented x3.  Ortho Exam examination of both legs reveals negative straight leg raise.  Negative logroll.  Negative FADIR.  Knee exam shows range of motion from 0 to 115 degrees.  No joint line tenderness.  Ligaments are stable.  He is neurovascular intact distally.  Specialty Comments:  No specialty comments available.  Imaging: XR KNEE 3 VIEW RIGHT  Result Date: 02/27/2020 Moderate degenerative changes medial and patellofemoral compartments  XR Lumbar Spine 2-3 Views  Result Date: 02/27/2020 Advanced degenerative changes L4-5 and L5-S1    PMFS History: Patient Active Problem List   Diagnosis Date Noted  . Chronic pain of right knee 10/17/2018  . Large cell (diffuse) non-Hodgkin's lymphoma ([place]) 05/10/2018  . Bacteremia due to Escherichia coli   . HTN (hypertension) 04/17/2018  . RLS (restless legs syndrome) 04/17/2018  . Abnormal LFTs 04/17/2018  . Acute metabolic encephalopathy 92/03/9416  . AKI (acute kidney injury) ([place]) 04/16/2018  . Dehydration   .  Fever, unspecified   . Sepsis ([place])   . Disorientation   . Hypokalemia   . Hypomagnesemia   . Anemia   . Encounter for antineoplastic chemotherapy   . At high risk of tumor lysis syndrome   . Swelling of lower leg   . Diffuse large B cell lymphoma ([place]) 01/15/2018  . 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[place]) 01/12/2018  . Counseling regarding advance care planning and goals of care 01/12/2018  . Bilateral leg pain 05/27/2014   Past Medical History:  Diagnosis Date  . Allergy   . Anemia    during chemo  . Arthritis    knee   .  Blood transfusion without reported diagnosis   . Cancer ([place])    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- last radiation 06-2018  . Cataract    removed both eyes with l;ens implants   . Family history of colon cancer    in his brother- dx'd age 20   . History of chemotherapy    last 06-01-2018  . History of kidney stones   . History of radiation therapy    last radiation 06-2018  . Hyperlipidemia    currently under control  . Hypertension   . Irregular heart beats   . Lymphadenopathy   . Pain, lower leg    Bilateral  . Peripheral arterial disease ([place])   . Pre-diabetes   . Red-green color blindness   . RLS (restless legs syndrome)   . Snores   . Wears glasses     Family History  Problem Relation Age of Onset  . Breast cancer Mother   . Diabetes Father   . Hypertension Father   . Stroke Father   . Mental illness Sister   . Hypertension Daughter   . Mental illness Daughter   . Hypertension Brother   . Colon cancer Brother 40       passed away 23-Dec-2018  . Breast cancer Sister   . Esophageal cancer Neg Hx   . Colon polyps Neg Hx   . Rectal cancer Neg Hx   . Stomach cancer Neg Hx     Past Surgical History:  Procedure Laterality Date  . CATARACT EXTRACTION W/ INTRAOCULAR LENS  IMPLANT, BILATERAL    . COLONOSCOPY    . dislodged salava stone    . FRACTURE SURGERY    . HAND ARTHROPLASTY  1995   crushed left hand  . INGUINAL LYMPH NODE BIOPSY Left 01/02/2018   Procedure: LEFT INGUINAL LYMPH NODE BIOPSY;  Surgeon: Rolm Bookbinder, MD;  Location: [facility];  Service: General;  Laterality: Left;  . IR IMAGING GUIDED PORT INSERTION  01/15/2018  . IR REMOVAL TUN ACCESS W/ PORT W/O FL MOD SED  03/11/2019  . MICROLARYNGOSCOPY Left 01/17/2014   Procedure: MICROLARYNGOSCOPY WITH EXCISION OF THE BIOPSY OF LEFT VOCAL CORD LESION;  Surgeon: Izora Gala, MD;  Location: [facility];  Service: ENT;  Laterality: Left;  . ORIF FOOT FRACTURE  2005    left  . REFRACTIVE SURGERY Right    removed cloudiness in right eye after cataract removal    Social History   Occupational History  . Not on file  Tobacco Use  . Smoking status: Current Every Day Smoker    Packs/day: 0.50    Years: 36.00    Pack years: 18.00    Types: Cigarettes  . Smokeless tobacco: Never Used  . Tobacco comment: he  denies smoking in about 2 weeks 07/13/18  Vaping Use  . Vaping Use: Never used  Substance and Sexual Activity  . Alcohol use: Yes    Alcohol/week: 11.0 standard drinks    Types: 5 Shots of liquor, 6 Cans of beer per week    Comment: weekends, he denies current alcohol use 07/13/18  . Drug use: Not Currently    Types: Cocaine    Comment: reports cocaine usage ~2X/ month; last use 12/26/17  . Sexual activity: Not on file

## 2020-03-02 ENCOUNTER — Other Ambulatory Visit: Payer: Self-pay

## 2020-03-02 DIAGNOSIS — M545 Low back pain, unspecified: Secondary | ICD-10-CM

## 2020-03-10 ENCOUNTER — Ambulatory Visit
Admission: RE | Admit: 2020-03-10 | Discharge: 2020-03-10 | Disposition: A | Discharge status: Home / Self Care | Payer: 59 | Source: Ambulatory Visit | Attending: Orthopaedic Surgery | Admitting: Orthopaedic Surgery

## 2020-03-10 ENCOUNTER — Other Ambulatory Visit: Payer: Self-pay

## 2020-03-10 DIAGNOSIS — M545 Low back pain, unspecified: Secondary | ICD-10-CM

## 2020-03-10 IMAGING — MR MR LUMBAR SPINE W/O CM
4 of 5 series · 25 of 48 positions shown · non-contrast
Comparison: None.

CLINICAL DATA: Low back pain.

EXAM:
MRI LUMBAR SPINE WITHOUT CONTRAST
TECHNIQUE: Multiplanar, multisequence MR imaging of the lumbar spine was
performed. No intravenous contrast was administered.

[Series 2: T2 post-contrast · sagittal · 4.0mm · 0.53mm/px · 6 of 16 slices shown]
[im 1/16]
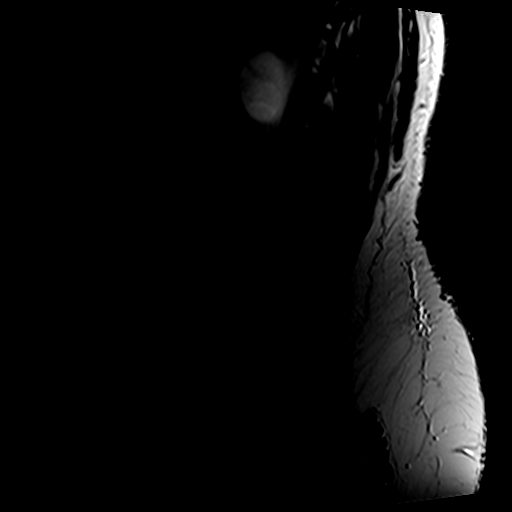
[im 4/16]
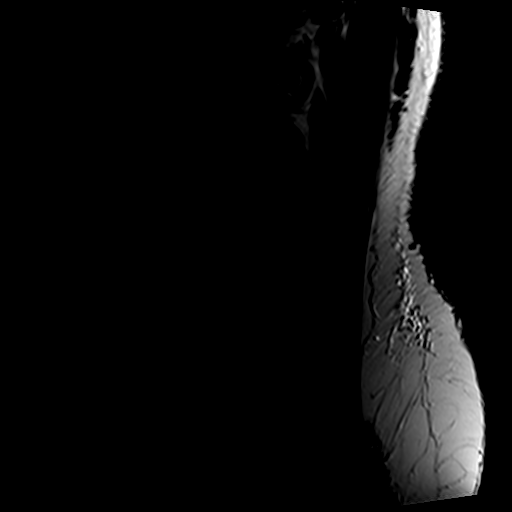
[im 7/16]
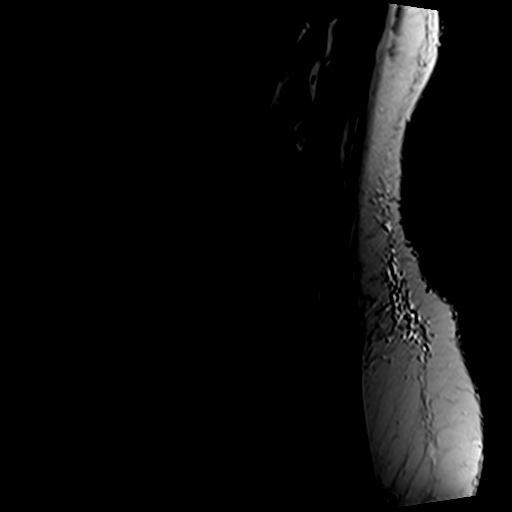
[im 10/16]
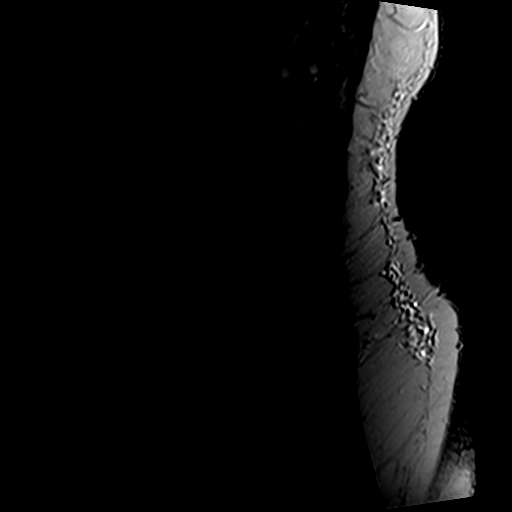
[im 13/16]
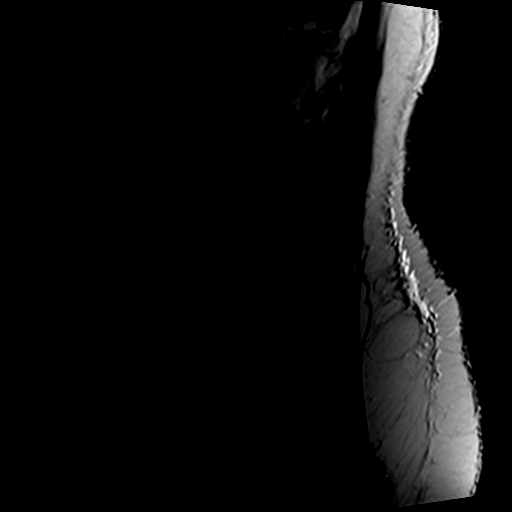
[im 16/16]
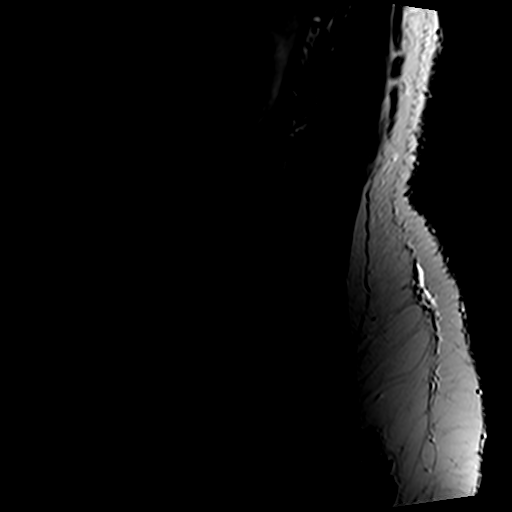

[Series 4: T1 · sagittal · 4.0mm · 0.53mm/px · 6 of 16 slices shown (1 of 2)]
[im 1/16]
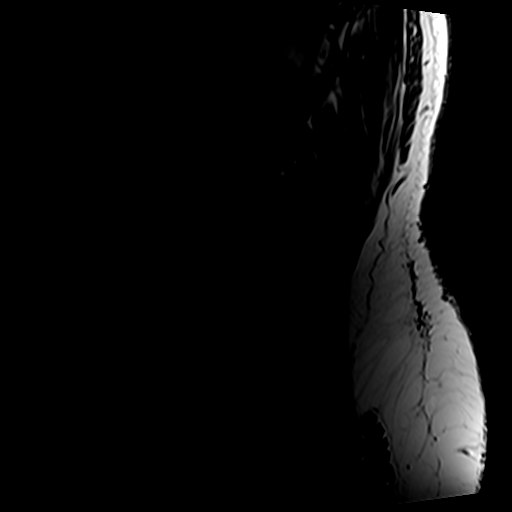
[im 4/16]
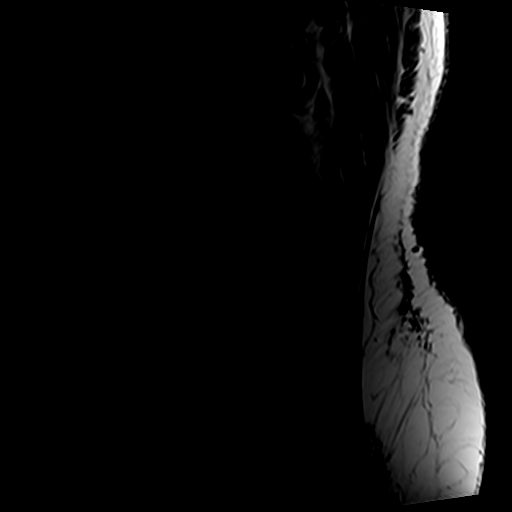
[im 7/16]
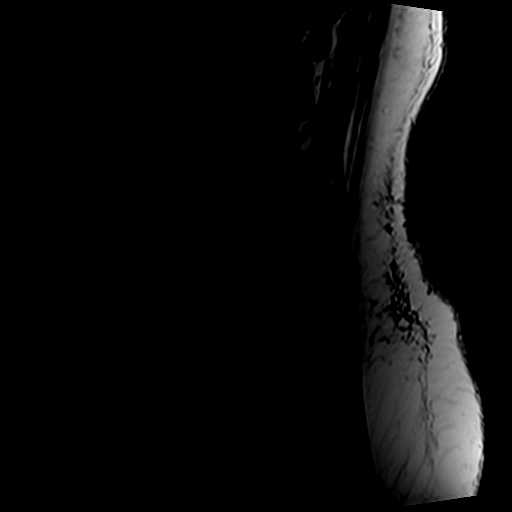
[im 10/16]
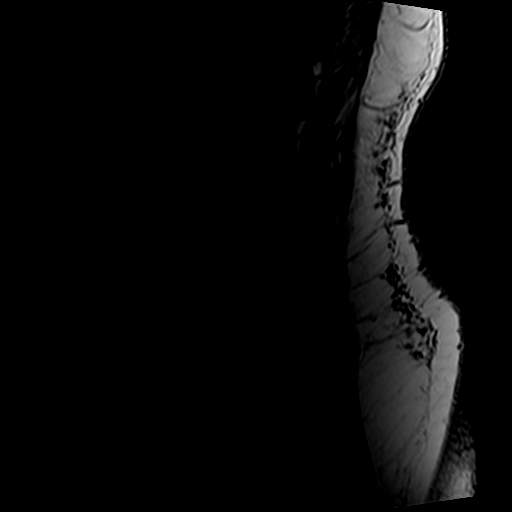
[im 13/16]
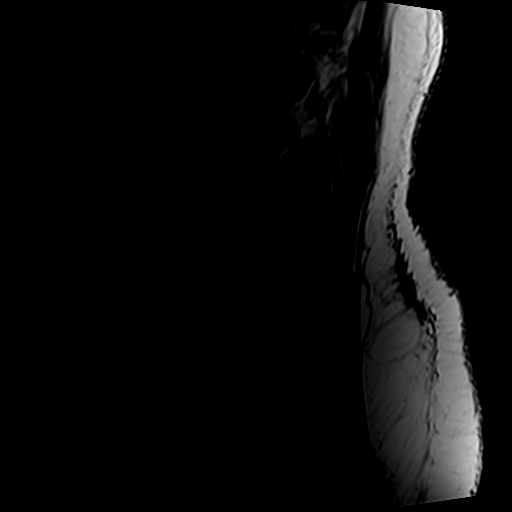
[im 16/16]
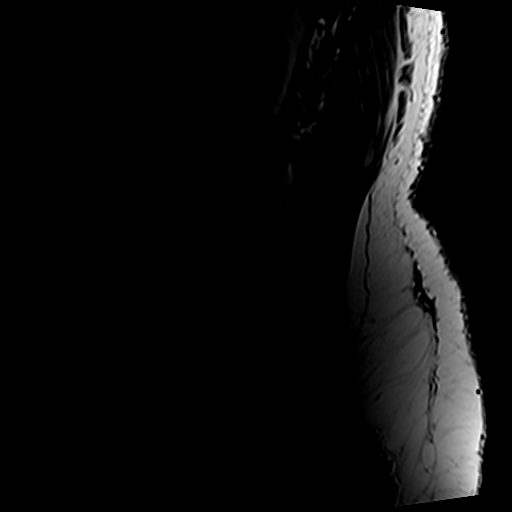

[Series 5: T2 · axial · 4.0mm · 0.70mm/px · z∈[-132,+70]mm · 9 of 38 slices shown]
[im 1/38]
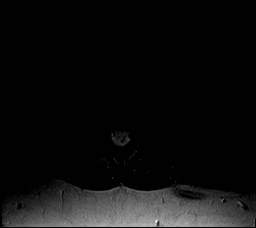
[im 6/38]
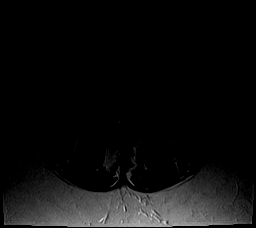
[im 11/38]
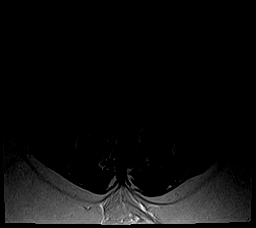
[im 16/38]
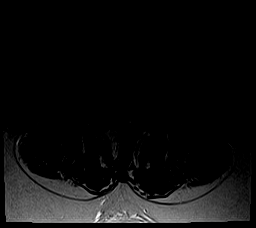
[im 19/38]
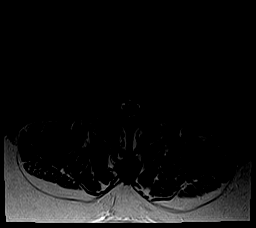
[im 22/38]
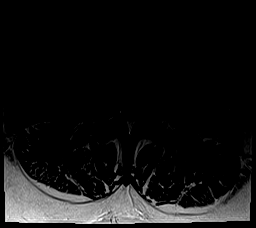
[im 27/38]
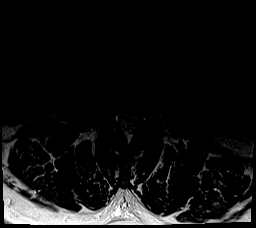
[im 32/38]
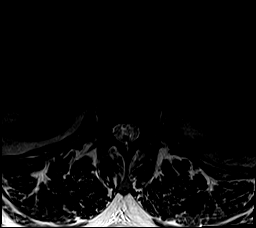
[im 38/38]
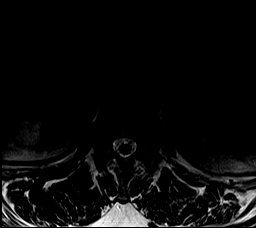

[Series 6: T1 · axial · 4.0mm · 0.35mm/px · z∈[-132,+38]mm · 4 of 38 slices shown (2 of 2)]
[im 1/38]
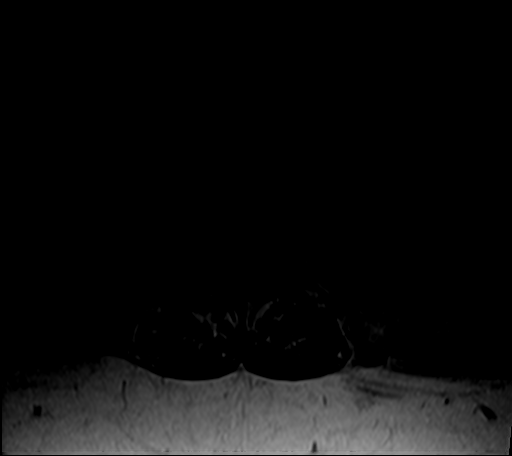
[im 6/38]
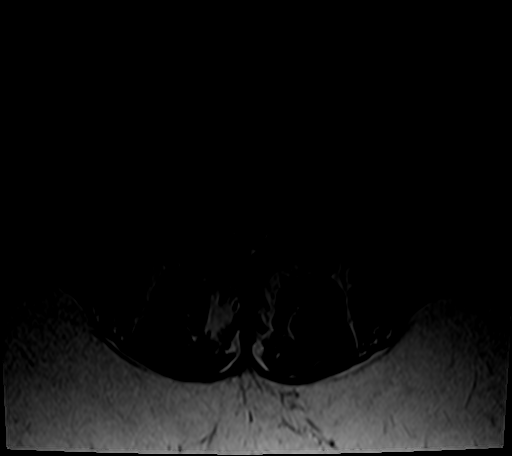
[im 19/38]
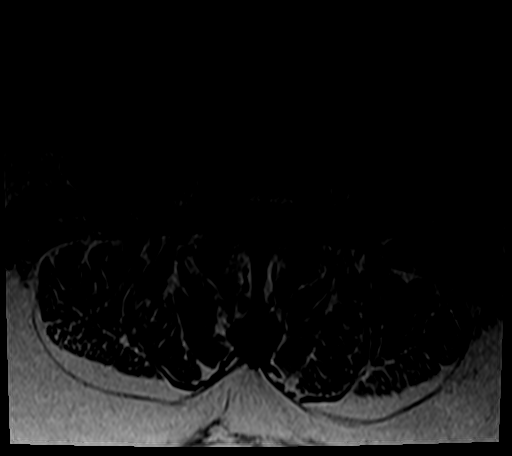
[im 32/38]
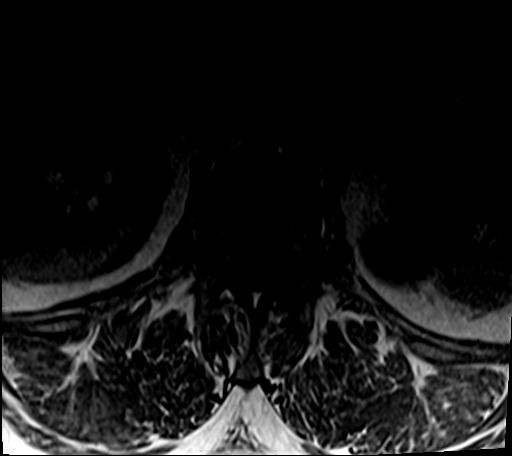

[25 of 48 positions shown; findings below may reference images not displayed]

FINDINGS: Segmentation:  Standard.

Alignment:  Physiologic.

Vertebrae:  No fracture, evidence of discitis, or bone lesion.

Conus medullaris and cauda equina: Conus extends to the L1 level.
Conus and cauda equina appear normal.

Paraspinal and other soft tissues: Bilateral renal cysts.

Disc levels:

T12-L1: No spinal canal or neural foraminal stenosis.

L1-2: Mild facet degenerative changes. No spinal canal or neural
foraminal stenosis.

L2-3: Loss of height, disc bulge, facet degenerative changes and
ligamentum flavum redundancy resulting in mild spinal canal stenosis
and moderate bilateral neural foraminal narrowing.

L3-4: Loss of disc height, disc bulge, facet degenerative changes
and ligamentum flavum redundancy which in association with
prominence of dural fat resulting moderate spinal canal stenosis
with effacement of the bilateral subarticular zones and moderate
bilateral neural foraminal narrowing. Right greater than left.

L4-5: Loss of disc height, disc bulge, prominent facet degenerative
changes and ligamentum flavum redundancy resulting in severe spinal
canal stenosis, moderate right and mild left neural foraminal
narrowing.

L5-S1: Disc bulge with associated central disc protrusion and facet
degenerative changes resulting in moderate spinal canal stenosis
with narrowing of the bilateral subarticular zones, moderate right
and severe left neural foraminal narrowing.
IMPRESSION: 1. Multilevel degenerative changes of the lumbar spine as described
above with severe spinal canal stenosis at L4-L5 and moderate spinal
canal stenosis at L3-L4 and L5-S1.
2. Moderate bilateral neural foraminal narrowing at L2-L3, L3-L4,
and L4-L5.
3. Moderate right and severe left neural foraminal narrowing at
L5-S1.

## 2020-03-12 ENCOUNTER — Encounter: Payer: Self-pay | Admitting: Orthopaedic Surgery

## 2020-03-12 ENCOUNTER — Ambulatory Visit: Payer: 59 | Admitting: Orthopaedic Surgery

## 2020-03-12 VITALS — Ht 72.0 in | Wt 306.0 lb

## 2020-03-12 DIAGNOSIS — M48062 Spinal stenosis, lumbar region with neurogenic claudication: Secondary | ICD-10-CM

## 2020-03-12 MED ORDER — HYDROCODONE-ACETAMINOPHEN 5-325 MG PO TABS
1.0000 | ORAL_TABLET | Freq: Every day | ORAL | 0 refills | Status: DC | PRN
Start: 2020-03-12 — End: 2020-07-03

## 2020-03-12 NOTE — Progress Notes (Signed)
Office Visit Note   Patient: Jesus [name]           Date of Birth: 1956-04-15           MRN: [id number] Visit Date: 03/12/2020              Requested by: Maude Leriche, PA-C [place],  [place] [address] PCP: Maude Leriche, PA-C   Assessment & Plan: Visit Diagnoses:  1. Spinal stenosis of lumbar region with neurogenic claudication     Plan: Impression is severe spinal canal stenosis at L4-5 and moderate spinal canal stenosis L3-4 and L5-S1, moderate bilateral neuroforaminal narrowing L2-3, L3-4 and L4-5 as well as moderate right and severe left neural foraminal narrowing at L5-S1.  We did briefly discuss referral to Dr. Ernestina Patches for epidural steroid injections versus referral to neurosurgery.  He would like to try injections first.  He will follow up with [place] as needed.  I have agreed to call in 1 more prescription of Norco to take very sparingly.  Follow-Up Instructions: Return if symptoms worsen or fail to improve.   Orders:  Orders Placed This Encounter  Procedures  . Ambulatory referral to Physical Medicine Rehab   No orders of the defined types were placed in this encounter.     Procedures: No procedures performed   Clinical Data: No additional findings.   Subjective: Chief Complaint  Patient presents with  . Lower Back - Follow-up    MRI review    HPI patient is a pleasant 64 year old gentleman who comes in today to discuss MRI results of the lumbar spine.  He was recently seen for knee pain as well as chronic fatigue to both legs concerning for neurogenic claudication.  Subsequent MRI was ordered which shows multilevel degenerative changes of the lumbar spine with severe spinal canal stenosis at L4-5 with moderate spinal canal stenosis at L3-4 and L5-S1.  He also has moderate bilateral neural foraminal narrowing L2-3, L3-4 and L4-5.  Also noted, moderate right and severe left neuroforaminal narrowing at L5-S1.  He continues to have  fatigue down both legs as well as bilateral knee pain right greater than left.  History of advanced degenerative changes there which is not been improved with cortisone or viscosupplementation injections.     Objective: Vital Signs: Ht 6' (1.829 m)   Wt (!) 306 lb (138.8 kg)   BMI 41.50 kg/m     Ortho Exam stable lumbar and knee exams.  Specialty Comments:  No specialty comments available.  Imaging: No new imaging   PMFS History: Patient Active Problem List   Diagnosis Date Noted  . Chronic pain of right knee 10/17/2018  . Large cell (diffuse) non-Hodgkin's lymphoma ([place]) 05/10/2018  . Bacteremia due to Escherichia coli   . HTN (hypertension) 04/17/2018  . RLS (restless legs syndrome) 04/17/2018  . Abnormal LFTs 04/17/2018  . Acute metabolic encephalopathy 57/26/2035  . AKI (acute kidney injury) ([place]) 04/16/2018  . Dehydration   . Fever, unspecified   . Sepsis ([place])   . Disorientation   . Hypokalemia   . Hypomagnesemia   . Anemia   . Encounter for antineoplastic chemotherapy   . At high risk of tumor lysis syndrome   . Swelling of lower leg   . Diffuse large B cell lymphoma ([place]) 01/15/2018  . 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[place]) 01/12/2018  . Counseling regarding advance care planning and goals of care 01/12/2018  . Bilateral  leg pain 05/27/2014   Past Medical History:  Diagnosis Date  . Allergy   . Anemia    during chemo  . Arthritis    knee   . Blood transfusion without reported diagnosis   . Cancer ([place])    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- last radiation 06-2018  . Cataract    removed both eyes with l;ens implants   . Family history of colon cancer    in his brother- dx'd age 43   . History of chemotherapy    last 06-01-2018  . History of kidney stones   . History of radiation therapy    last radiation 06-2018  . Hyperlipidemia    currently under control  . Hypertension   .  Irregular heart beats   . Lymphadenopathy   . Pain, lower leg    Bilateral  . Peripheral arterial disease ([place])   . Pre-diabetes   . Red-green color blindness   . RLS (restless legs syndrome)   . Snores   . Wears glasses     Family History  Problem Relation Age of Onset  . Breast cancer Mother   . Diabetes Father   . Hypertension Father   . Stroke Father   . Mental illness Sister   . Hypertension Daughter   . Mental illness Daughter   . Hypertension Brother   . Colon cancer Brother 31       passed away 2018/12/08  . Breast cancer Sister   . Esophageal cancer Neg Hx   . Colon polyps Neg Hx   . Rectal cancer Neg Hx   . Stomach cancer Neg Hx     Past Surgical History:  Procedure Laterality Date  . CATARACT EXTRACTION W/ INTRAOCULAR LENS  IMPLANT, BILATERAL    . COLONOSCOPY    . dislodged salava stone    . FRACTURE SURGERY    . HAND ARTHROPLASTY  1995   crushed left hand  . INGUINAL LYMPH NODE BIOPSY Left 01/02/2018   Procedure: LEFT INGUINAL LYMPH NODE BIOPSY;  Surgeon: Rolm Bookbinder, MD;  Location: [facility];  Service: General;  Laterality: Left;  . IR IMAGING GUIDED PORT INSERTION  01/15/2018  . IR REMOVAL TUN ACCESS W/ PORT W/O FL MOD SED  03/11/2019  . MICROLARYNGOSCOPY Left 01/17/2014   Procedure: MICROLARYNGOSCOPY WITH EXCISION OF THE BIOPSY OF LEFT VOCAL CORD LESION;  Surgeon: Izora Gala, MD;  Location: [facility];  Service: ENT;  Laterality: Left;  . ORIF FOOT FRACTURE  2005   left  . REFRACTIVE SURGERY Right    removed cloudiness in right eye after cataract removal    Social History   Occupational History  . Not on file  Tobacco Use  . Smoking status: Current Every Day Smoker    Packs/day: 0.50    Years: 36.00    Pack years: 18.00    Types: Cigarettes  . Smokeless tobacco: Never Used  . Tobacco comment: he denies smoking in about 2 weeks 07/13/18  Vaping Use  . Vaping Use: Never used  Substance and Sexual Activity  . Alcohol use: Yes     Alcohol/week: 11.0 standard drinks    Types: 5 Shots of liquor, 6 Cans of beer per week    Comment: weekends, he denies current alcohol use 07/13/18  . Drug use: Not Currently    Types: Cocaine    Comment: reports cocaine usage ~2X/ month; last use 12/26/17  . Sexual activity: Not  on file

## 2020-03-19 ENCOUNTER — Other Ambulatory Visit: Payer: Self-pay | Admitting: Family Medicine

## 2020-03-19 DIAGNOSIS — I10 Essential (primary) hypertension: Secondary | ICD-10-CM

## 2020-04-15 ENCOUNTER — Other Ambulatory Visit: Payer: Self-pay

## 2020-04-15 ENCOUNTER — Ambulatory Visit (INDEPENDENT_AMBULATORY_CARE_PROVIDER_SITE_OTHER): Payer: 59 | Admitting: Physical Medicine and Rehabilitation

## 2020-04-15 ENCOUNTER — Encounter: Payer: Self-pay | Admitting: Physical Medicine and Rehabilitation

## 2020-04-15 ENCOUNTER — Ambulatory Visit: Payer: Self-pay

## 2020-04-15 VITALS — BP 160/87 | HR 92

## 2020-04-15 DIAGNOSIS — M5416 Radiculopathy, lumbar region: Secondary | ICD-10-CM

## 2020-04-15 MED ORDER — DEXAMETHASONE SODIUM PHOSPHATE 10 MG/ML IJ SOLN
15.0000 mg | Freq: Once | INTRAMUSCULAR | Status: AC
  Administered 2020-04-15: 15 mg

## 2020-04-15 NOTE — Procedures (Signed)
Lumbosacral Transforaminal Epidural Steroid Injection - Sub-Pedicular Approach with Fluoroscopic Guidance  Patient: Jesus [name]      Date of Birth: [birth year] MRN: [id number] PCP: Maude Leriche, PA-C      Visit Date: 04/15/2020   Universal Protocol:    Date/Time: 04/15/2020  Consent Given By: the patient  Position: PRONE  Additional Comments: Vital signs were monitored before and after the procedure. Patient was prepped and draped in the usual sterile fashion. The correct patient, procedure, and site was verified.   Injection Procedure Details:   Procedure diagnoses: Lumbar radiculopathy [M54.16]    Meds Administered:  Meds ordered this encounter  Medications  . dexamethasone (DECADRON) injection 15 mg    Laterality: Bilateral  Location/Site:  L4-L5  Needle:6.0 in., 22 ga.  Short bevel or Quincke spinal needle  Needle Placement: Transforaminal  Findings:    -Comments: Excellent flow of contrast along the nerve, nerve root and into the epidural space.  Procedure Details: After squaring off the end-plates to get a true AP view, the C-arm was positioned so that an oblique view of the foramen as noted above was visualized. The target area is just inferior to the "nose of the scotty dog" or sub pedicular. The soft tissues overlying this structure were infiltrated with 2-3 ml. of 1% Lidocaine without Epinephrine.  The spinal needle was inserted toward the target using a "trajectory" view along the fluoroscope beam.  Under AP and lateral visualization, the needle was advanced so it did not puncture dura and was located close the 6 O'Clock position of the pedical in AP tracterory. Biplanar projections were used to confirm position. Aspiration was confirmed to be negative for CSF and/or blood. A 1-2 ml. volume of Isovue-250 was injected and flow of contrast was noted at each level. Radiographs were obtained for documentation purposes.   After attaining the desired flow of  contrast documented above, a 0.5 to 1.0 ml test dose of 0.25% Marcaine was injected into each respective transforaminal space.  The patient was observed for 90 seconds post injection.  After no sensory deficits were reported, and normal lower extremity motor function was noted,   the above injectate was administered so that equal amounts of the injectate were placed at each foramen (level) into the transforaminal epidural space.   Additional Comments:  The patient tolerated the procedure well Dressing: 2 x 2 sterile gauze and Band-Aid    Post-procedure details: Patient was observed during the procedure. Post-procedure instructions were reviewed.  Patient left the clinic in stable condition.

## 2020-04-15 NOTE — Progress Notes (Signed)
Pt state Lower back pain that travels down to both legs to his knee. Pt state the right knee hurts more that the left. Pt state walking and standing makes the pain worse. Pt state he take pain meds to help ease the pain.  Numeric Pain Rating Scale and Functional Assessment Average Pain 3   In the last MONTH (on 0-10 scale) has pain interfered with the following?  1. General activity like being  able to carry out your everyday physical activities such as walking, climbing stairs, carrying groceries, or moving a chair?  Rating(10)   +Driver, -BT, -Dye Allergies.

## 2020-04-19 ENCOUNTER — Other Ambulatory Visit: Payer: Self-pay | Admitting: Family Medicine

## 2020-04-19 DIAGNOSIS — I10 Essential (primary) hypertension: Secondary | ICD-10-CM

## 2020-04-22 ENCOUNTER — Other Ambulatory Visit: Payer: Self-pay | Admitting: Family Medicine

## 2020-04-22 DIAGNOSIS — N529 Male erectile dysfunction, unspecified: Secondary | ICD-10-CM

## 2020-05-17 ENCOUNTER — Other Ambulatory Visit: Payer: Self-pay | Admitting: Family Medicine

## 2020-05-17 DIAGNOSIS — I10 Essential (primary) hypertension: Secondary | ICD-10-CM

## 2020-05-27 NOTE — Progress Notes (Signed)
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 male is here because of left lower extremity edema and lymphadenopathy.  The patient was seen in the emergency room this past Friday for the same issue.  A CT of the abdomen and pelvis was performed showing bulky left inguinal, left hemipelvic, and retroperitoneal adenopathy.  He was referred to [place] from the emergency room for further evaluation.  Doppler ultrasound of the left lower extremity was performed and was negative for DVT. Patient reports that he has been having left lower extremity edema in his left groin and left leg for approximately 1 month.  He states that the swelling in the left groin started to get better but then worsened.  The left lower extremity edema has slowly worsened over time.  Patient denies having fevers and chills.  He reports that he does have night sweats at times.  He reported having headaches approximate 1 [place]  He thinks his headaches are related to not having his blood pressure medication.  His headaches have now resolved.  He denies visual changes.  The patient denies chest pain, shortness of breath and cough.  No nausea, vomiting, constipation, diarrhea.  Denies abdominal pain.  The patient denies recent weight loss and has actually gained weight recently.  Patient denies epistaxis, bleeding gums, hemoptysis, hematuria, but occasionally, and melena.  He reports increased urinary frequency over the past month but no dysuria.  The patient is here for evaluation and discussion of his recent CT and lab findings.  Interval History:   Jesus [name]  returns today for management and evaluation of his T-Cell/histocyte rich Large B-Cell Lymphoma. The patient's last visit with [place] was on 02/26/2020. [name].  The pt reports that he has been feeling well, but experiencing back pain. He has five ruptured discs as apparent on his X-rays. The pt denies any recent falls or injuries. He received two epidural steroid injections that helped, but is currently starting to hurt again. The pt claims he is not getting any major surgeries.  The pt claims he just joined a gym and will start working out M/W/F.   The pt notes that his Brother-in-Law passed away over the holiday season.   The pt notes he has received his COVID vaccines and booster, annual flu vaccine, Pneumovax, and Shingles vaccine.  Lab results today 05/28/2020 of CBC w/diff and CMP is as follows: all values are WNL except for RBC at 4.17, Hgb at 12.5, HCT at 38.9, CMP in progress. 05/28/2020 LDH is . Lab Results  Component Value Date   LDH 160 05/28/2020   On review of systems, pt denies fevers, chills, night sweats, new lumps and bumps, abdominal pain, changes in bowel habits and any other symptoms.   MEDICAL HISTORY:   Past Medical History:  Diagnosis Date  . Allergy   . Anemia    during chemo  . Arthritis    knee   . Blood transfusion without reported diagnosis   . Cancer ([place])    Non- Hodgkins lymphoma IV- large B Cell  Lymphoma - last chemo 06-01-2018- last radiation 06-2018  . Cataract    removed both eyes with l;ens implants   . Family history of colon cancer    in his brother- dx'd age 58   . History of chemotherapy    last 06-01-2018  . History of kidney stones   . History of radiation therapy    last radiation 06-2018  . Hyperlipidemia    currently under control  . Hypertension   . Irregular heart beats   . Lymphadenopathy   . Pain, lower leg    Bilateral  . Peripheral arterial disease ([place])   . Pre-diabetes   . Red-green  color blindness   . RLS (restless legs syndrome)   . Snores   . Wears glasses     SURGICAL HISTORY: Past Surgical History:  Procedure Laterality Date  . CATARACT EXTRACTION W/ INTRAOCULAR LENS  IMPLANT, BILATERAL    . COLONOSCOPY    . dislodged salava stone    . FRACTURE SURGERY    . HAND ARTHROPLASTY  1995   crushed left hand  . INGUINAL LYMPH NODE BIOPSY Left 01/02/2018   Procedure: LEFT INGUINAL LYMPH NODE BIOPSY;  Surgeon: Rolm Bookbinder, MD;  Location: [facility];  Service: General;  Laterality: Left;  . IR IMAGING GUIDED PORT INSERTION  01/15/2018  . IR REMOVAL TUN ACCESS W/ PORT W/O FL MOD SED  03/11/2019  . MICROLARYNGOSCOPY Left 01/17/2014   Procedure: MICROLARYNGOSCOPY WITH EXCISION OF THE BIOPSY OF LEFT VOCAL CORD LESION;  Surgeon: Izora Gala, MD;  Location: [facility];  Service: ENT;  Laterality: Left;  . ORIF FOOT FRACTURE  2005   left  . REFRACTIVE SURGERY Right    removed cloudiness in right eye after cataract removal     SOCIAL HISTORY: Social History   Socioeconomic History  . Marital status: Divorced    Spouse name: Not on file  . Number of children: 3  . Years of education: Not on file  . Highest education level: Not on file  Occupational History  . Not on file  Tobacco Use  . Smoking status: Current Every Day Smoker    Packs/day: 0.50    Years: 36.00    Pack years: 18.00    Types: Cigarettes  . Smokeless tobacco: Never Used  . Tobacco comment: he denies smoking in about 2 weeks 07/13/18  Vaping Use  . Vaping Use: Never used  Substance and Sexual Activity  . Alcohol use: Yes    Alcohol/week: 11.0 standard drinks    Types: 5 Shots of liquor, 6 Cans of beer per week    Comment: weekends, he denies current alcohol use 07/13/18  . Drug use: Not Currently    Types: Cocaine    Comment: reports cocaine usage ~2X/ month; last use 12/26/17  . Sexual activity: Not on file  Other Topics Concern  . Not on file  Social History Narrative  . Not on  file   Social Determinants of Health   Financial Resource Strain: Not on file  Food Insecurity: Not on file  Transportation Needs: Not on file  Physical Activity: Not on file  Stress: Not on file  Social Connections: Not on file  Intimate Partner Violence: Not on file    FAMILY HISTORY: Family History  Problem Relation Age of Onset  . Breast cancer Mother   . Diabetes Father   . Hypertension Father   . Stroke Father   . Mental illness Sister   . Hypertension  Daughter   . Mental illness Daughter   . Hypertension Brother   . Colon cancer Brother 25       passed away Dec 06, 2018  . Breast cancer Sister   . Esophageal cancer Neg Hx   . Colon polyps Neg Hx   . Rectal cancer Neg Hx   . Stomach cancer Neg Hx     ALLERGIES:  is allergic to bee venom.  MEDICATIONS:  Current Outpatient Medications  Medication Sig Dispense Refill  . amLODipine (NORVASC) 10 MG tablet TAKE 1 TABLET BY MOUTH EVERY DAY 30 tablet 0  . aspirin 81 MG tablet Take 81 mg by mouth daily.    . B-COMPLEX-C PO Take by mouth.    Marland Kitchen glucosamine-chondroitin 500-400 MG tablet Take 1 tablet by mouth 3 (three) times daily.    Marland Kitchen HYDROcodone-acetaminophen (NORCO) 5-325 MG tablet Take 1-2 tablets by mouth daily as needed. 10 tablet 0  . lisinopril (ZESTRIL) 20 MG tablet TAKE 1 TABLET BY MOUTH EVERY DAY 30 tablet 0  . Multiple Vitamins-Minerals (CENTRUM SILVER 50+MEN PO) Take by mouth.    . tadalafil (CIALIS) 5 MG tablet TAKE 0.5-1 TABLETS (2.5-5 MG TOTAL) BY MOUTH DAILY AS NEEDED FOR ERECTILE DYSFUNCTION. 90 tablet 0  . triamcinolone ointment (KENALOG) 0.5 % Apply 1 application topically 2 (two) times daily. 30 g 0   No current facility-administered medications for this visit.    REVIEW OF SYSTEMS:   10 Point review of Systems was done is negative except as noted above.  PHYSICAL EXAMINATION: ECOG PERFORMANCE STATUS: 1 - Symptomatic but completely ambulatory  Vitals:   05/28/20 1417  BP: (!) 153/87  Pulse: 92   Resp: 18  Temp: 97.8 F (36.6 C)  SpO2: 99%   Filed Weights   05/28/20 1417  Weight: (!) 305 lb 11.2 oz (138.7 kg)   GENERAL:alert, in no acute distress and comfortable SKIN: no acute rashes, no significant lesions EYES: conjunctiva are pink and non-injected, sclera anicteric OROPHARYNX: MMM, no exudates, no oropharyngeal erythema or ulceration NECK: supple, no JVD LYMPH:  no palpable lymphadenopathy in the cervical, axillary or inguinal regions LUNGS: clear to auscultation b/l with normal respiratory effort HEART: regular rate & rhythm ABDOMEN:  normoactive bowel sounds , non tender, not distended. Extremity: no pedal edema PSYCH: alert & oriented x 3 with fluent speech NEURO: no focal motor/sensory deficits   LABORATORY DATA:   I have reviewed the data as listed CBC Latest Ref Rng & Units 05/28/2020 02/26/2020 11/28/2019  WBC 4.0 - 10.5 K/uL 6.6 8.2 7.2  Hemoglobin 13.0 - 17.0 g/dL 12.5(L) 11.9(L) 12.3(L)  Hematocrit 39.0 - 52.0 % 38.9(L) 36.6(L) 37.3(L)  Platelets 150 - 400 K/uL 303 273 295   CBC    Component Value Date/Time   WBC 6.6 05/28/2020 1357   RBC 4.17 (L) 05/28/2020 1357   HGB 12.5 (L) 05/28/2020 1357   HGB 12.1 (L) 08/22/2019 1325   HCT 38.9 (L) 05/28/2020 1357   PLT 303 05/28/2020 1357   PLT 264 08/22/2019 1325   MCV 93.3 05/28/2020 1357   MCV 87.3 12/22/2017 1518   MCH 30.0 05/28/2020 1357   MCHC 32.1 05/28/2020 1357   RDW 13.6 05/28/2020 1357   LYMPHSABS 2.9 05/28/2020 1357   MONOABS 0.4 05/28/2020 1357   EOSABS 0.1 05/28/2020 1357   BASOSABS 0.0 05/28/2020 1357    CMP Latest Ref Rng & Units 05/28/2020 02/26/2020 11/28/2019  Glucose 70 - 99 mg/dL 125(H) 101(H) 114(H)  BUN 8 - 23 mg/dL  16 13 19   Creatinine 0.61 - 1.24 mg/dL 1.05 0.94 1.05  Sodium 135 - 145 mmol/L 142 141 140  Potassium 3.5 - 5.1 mmol/L 4.0 3.8 3.9  Chloride 98 - 111 mmol/L 109 107 103  CO2 22 - 32 mmol/L 24 26 28   Calcium 8.9 - 10.3 mg/dL 9.1 9.1 9.3  Total Protein 6.5 - 8.1  g/dL 7.4 6.9 7.2  Total Bilirubin 0.3 - 1.2 mg/dL 0.5 0.3 0.9  Alkaline Phos 38 - 126 U/L 64 60 53  AST 15 - 41 U/L 13(L) 12(L) 16  ALT 0 - 44 U/L 13 12 17    . Lab Results  Component Value Date   LDH 160 05/28/2020    01/02/18 Left Inguinal LN Bx:   12/26/17 Tissue Flow Cytometry:   12/26/17 Inguinal Core biopsy:    RADIOGRAPHIC STUDIES: I have personally reviewed the radiological images as listed and agreed with the findings in the report. No results found.  ASSESSMENT & PLAN:   This is a pleasant 65 y.o. 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  CT of the abdomen and pelvis performed on 12/22/2017 showed bulky left inguinal, left hemipelvic, and retroperitoneal adenopathy.   01/02/18 Left inguinal LN Biopsy revealed T-Cell/histocyte rich Large B-Cell Lymphoma  12/27/17 ECHO revealed LV EF of 55-60%   01/05/18 PET/CT revealedMassively enlarged pelvic lymph nodes intense metabolic activity consistent lymphoma. 2. Additional hypermetabolic lymph nodes in the porta hepatis and retroperitoneum LEFT aorta. 3. Solitary hypermetabolic mediastinal lymph node in the upper LEFT Mediastinum. 4. 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 5. Normal spleen.  HIV non reactive on 12/22/2017.Hep C and hep B serology negative.  03/14/18 PET/CT revealedPET-CT findings suggest an excellent response to chemotherapy. The abdominal lymphadenopathy has near completely resolved and demonstrates a near complete metabolic response. The pelvic and inguinal adenopathy has significantly decreased in size and the metabolic activity has significantly decreased. 2. Diffuse marrow activity likely due  to chemotherapy and or marrow stimulating drugs. I do not see any discrete persistent lesions.   04/17/18 CT Head revealedSubtle mesial caudothalamic hypodensities may be artifact though, the could reflect encephalitis or Wernicke's encephalopathy. Consider MRI of the head with and without contrast. 2. Mild chronic small vessel ischemic changes  S/p 6 cycles of EPOCH-R completed on 06/01/18  06/28/18 PET/CT revealed Continued good response to treatment. No residual measurable or hypermetabolic abdominal lymphadenopathy and no recurrent osseous disease. 2. Interval decrease in size of the left operator region lymph node and also the left inguinal lymph node. However, the both have small foci of slightly increased hypermetabolism which bears surveillance. 3. No new or progressive lymphadenopathy in the neck, chest, abdomen or pelvis.  S/p 39.6 Gy in 22 fractions between 07/25/18 and 08/23/18  02/13/2019 CT C/A/P ([id number]) ([id number]) revealed "1. Response to therapy of pelvic adenopathy compared to the PET of 06/28/2018. 2. No new or progressive disease. 3. Mild prostatomegaly. 4. 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 5. Hepatomegaly."  2) left lower extremity swelling- now resolved Doppler ultrasound for DVT was negative in the left lower extremity.  Likely from venous compression +/- lymphatic obstruction from bulky left inguinal, left hemipelvic, and retroperitoneal adenopathy.   3) S/p Port a cath placement - removed 03/11/2019  4)h/oE.coli UTI -  Pansensitive -Resolved.Also appearedto have BPH like symptomatology. -Was hospitalized 04/16/18 to 04/20/18 for E.coli sepsis from UTI, treated with antibiotics.  5) s/p Symptomatic Anemia Hgb 6.7 - due to chemotherapy/sepsis- s/p PRBC transfusion. No overt evidence of bleeding. hgb now stable at 10.6  6) HTN-was elevated in setting of improved po intake and  steroids -improving control -continue f/u with PCP  8) Likely BPH - on flomax  PLAN: -Discussed pt labwork today, 05/28/2020; blood counts look normal, chemistries and LDH is wnl. -Discussed inversion table for stretching back to relieve pressure from nerves.  -No lab, clinical, or radiographic evidence of B-Cell Lymphoma recurrence at this time.  -Pt is currently 2 years out from treatment (January 2020). Will continue watchful observation. No indication to treat. Pt continues to be in remission. -Recommend pt continue f/u with PCP regarding age-related cancer screenings and all checkups.  -Will see back in 6 months with labs.   FOLLOW UP: RTC with Dr Irene Limbo with labs in 6 months   The total time spent in the appointment was 20 minutes and more than 50% was on counseling and direct patient cares.  All of the patient's questions were answered with apparent satisfaction. The patient knows to call the clinic with any problems, questions or concerns.   Sullivan Lone MD [place] AAHIVMS [facility] Hematology/Oncology Physician [facility]  (Office):       [phone] (Work cell):  [phone] (Fax):           [phone]  I, Reinaldo Raddle, am acting as scribe for Dr. Sullivan Lone, MD.   .I have reviewed the above documentation for accuracy and completeness, and I agree with the above. Brunetta Genera MD

## 2020-05-28 ENCOUNTER — Inpatient Hospital Stay: Payer: 59 | Admitting: Hematology

## 2020-05-28 ENCOUNTER — Other Ambulatory Visit: Payer: Self-pay

## 2020-05-28 ENCOUNTER — Inpatient Hospital Stay: Payer: 59 | Attending: Hematology

## 2020-05-28 VITALS — BP 153/87 | HR 92 | Temp 97.8°F | Resp 18 | Ht 72.0 in | Wt 305.7 lb

## 2020-05-28 DIAGNOSIS — F17210 Nicotine dependence, cigarettes, uncomplicated: Secondary | ICD-10-CM | POA: Diagnosis not present

## 2020-05-28 DIAGNOSIS — Z9221 Personal history of antineoplastic chemotherapy: Secondary | ICD-10-CM | POA: Insufficient documentation

## 2020-05-28 DIAGNOSIS — C8330 Diffuse large B-cell lymphoma, unspecified site: Secondary | ICD-10-CM

## 2020-05-28 DIAGNOSIS — Z923 Personal history of irradiation: Secondary | ICD-10-CM | POA: Diagnosis not present

## 2020-05-28 DIAGNOSIS — Z8572 Personal history of non-Hodgkin lymphomas: Secondary | ICD-10-CM | POA: Insufficient documentation

## 2020-05-28 LAB — CMP (CANCER CENTER ONLY)
ALT: 13 U/L (ref 0–44)
AST: 13 U/L — ABNORMAL LOW (ref 15–41)
Albumin: 4 g/dL (ref 3.5–5.0)
Alkaline Phosphatase: 64 U/L (ref 38–126)
Anion gap: 9 (ref 5–15)
BUN: 16 mg/dL (ref 8–23)
CO2: 24 mmol/L (ref 22–32)
Calcium: 9.1 mg/dL (ref 8.9–10.3)
Chloride: 109 mmol/L (ref 98–111)
Creatinine: 1.05 mg/dL (ref 0.61–1.24)
GFR, Estimated: >60 mL/min
Glucose, Bld: 125 mg/dL — ABNORMAL HIGH (ref 70–99)
Potassium: 4 mmol/L (ref 3.5–5.1)
Sodium: 142 mmol/L (ref 135–145)
Total Bilirubin: 0.5 mg/dL (ref 0.3–1.2)
Total Protein: 7.4 g/dL (ref 6.5–8.1)

## 2020-05-28 LAB — CBC WITH DIFFERENTIAL/PLATELET
Abs Immature Granulocytes: 0.02 10*3/uL (ref 0.00–0.07)
Basophils Absolute: 0 10*3/uL (ref 0.0–0.1)
Basophils Relative: 1 %
Eosinophils Absolute: 0.1 10*3/uL (ref 0.0–0.5)
Eosinophils Relative: 2 %
HCT: 38.9 % — ABNORMAL LOW (ref 39.0–52.0)
Hemoglobin: 12.5 g/dL — ABNORMAL LOW (ref 13.0–17.0)
Immature Granulocytes: 0 %
Lymphocytes Relative: 44 %
Lymphs Abs: 2.9 10*3/uL (ref 0.7–4.0)
MCH: 30 pg (ref 26.0–34.0)
MCHC: 32.1 g/dL (ref 30.0–36.0)
MCV: 93.3 fL (ref 80.0–100.0)
Monocytes Absolute: 0.4 10*3/uL (ref 0.1–1.0)
Monocytes Relative: 6 %
Neutro Abs: 3.1 10*3/uL (ref 1.7–7.7)
Neutrophils Relative %: 47 %
Platelets: 303 10*3/uL (ref 150–400)
RBC: 4.17 MIL/uL — ABNORMAL LOW (ref 4.22–5.81)
RDW: 13.6 % (ref 11.5–15.5)
WBC: 6.6 10*3/uL (ref 4.0–10.5)
nRBC: 0 % (ref 0.0–0.2)

## 2020-05-28 LAB — LACTATE DEHYDROGENASE: LDH: 160 U/L (ref 98–192)

## 2020-06-03 ENCOUNTER — Other Ambulatory Visit: Payer: Self-pay | Admitting: Family Medicine

## 2020-06-03 DIAGNOSIS — I10 Essential (primary) hypertension: Secondary | ICD-10-CM

## 2020-06-03 NOTE — Telephone Encounter (Signed)
Call to patient- he states he is now seeing Dr Kenton Kingfisher at [facility]. Advised him to contact pharmacy and advise them.

## 2020-06-17 NOTE — Progress Notes (Signed)
Jesus [name] - 65 y.o. male MRN [id number]  Date of birth: 01-Apr-1956  Office Visit Note: Visit Date: 04/15/2020 PCP: Maude Leriche, PA-C Referred by: Filiberto Pinks  Subjective: Chief Complaint  Patient presents with  . Lower Back - Pain  . Right Knee - Pain  . Left Knee - Pain   HPI:  Jesus [name] is a 65 y.o. male who comes in today at the request of Dr. Eduard Roux for planned Bilateral L4-L5 Lumbar epidural steroid injection with fluoroscopic guidance.  The patient has failed conservative care including home exercise, medications, time and activity modification.  This injection will be diagnostic and hopefully therapeutic.  Please see requesting physician notes for further details and justification.  MRI reviewed with images and spine model.  MRI reviewed in the note below.   ROS Otherwise per HPI.  Assessment & Plan: Visit Diagnoses:    ICD-10-CM   1. Lumbar radiculopathy  M54.16 XR C-ARM NO REPORT    Epidural Steroid injection    dexamethasone (DECADRON) injection 15 mg    Plan: No additional findings.   Meds & Orders:  Meds ordered this encounter  Medications  . dexamethasone (DECADRON) injection 15 mg    Orders Placed This Encounter  Procedures  . XR C-ARM NO REPORT  . Epidural Steroid injection    Follow-up: Return if symptoms worsen or fail to improve.   Procedures: No procedures performed  Lumbosacral Transforaminal Epidural Steroid Injection - Sub-Pedicular Approach with Fluoroscopic Guidance  Patient: Jesus [name]      Date of Birth: 1955-09-08 MRN: [id number] PCP: Maude Leriche, PA-C      Visit Date: 04/15/2020   Universal Protocol:    Date/Time: 04/15/2020  Consent Given By: the patient  Position: PRONE  Additional Comments: Vital signs were monitored before and after the procedure. Patient was prepped and draped in the usual sterile fashion. The correct patient, procedure, and site was verified.   Injection  Procedure Details:   Procedure diagnoses: Lumbar radiculopathy [M54.16]    Meds Administered:  Meds ordered this encounter  Medications  . dexamethasone (DECADRON) injection 15 mg    Laterality: Bilateral  Location/Site:  L4-L5  Needle:6.0 in., 22 ga.  Short bevel or Quincke spinal needle  Needle Placement: Transforaminal  Findings:    -Comments: Excellent flow of contrast along the nerve, nerve root and into the epidural space.  Procedure Details: After squaring off the end-plates to get a true AP view, the C-arm was positioned so that an oblique view of the foramen as noted above was visualized. The target area is just inferior to the "nose of the scotty dog" or sub pedicular. The soft tissues overlying this structure were infiltrated with 2-3 ml. of 1% Lidocaine without Epinephrine.  The spinal needle was inserted toward the target using a "trajectory" view along the fluoroscope beam.  Under AP and lateral visualization, the needle was advanced so it did not puncture dura and was located close the 6 O'Clock position of the pedical in AP tracterory. Biplanar projections were used to confirm position. Aspiration was confirmed to be negative for CSF and/or blood. A 1-2 ml. volume of Isovue-250 was injected and flow of contrast was noted at each level. Radiographs were obtained for documentation purposes.   After attaining the desired flow of contrast documented above, a 0.5 to 1.0 ml test dose of 0.25% Marcaine was injected into each respective transforaminal space.  The patient was observed for 90 seconds post injection.  After no sensory deficits were reported, and normal lower extremity motor function was noted,   the above injectate was administered so that equal amounts of the injectate were placed at each foramen (level) into the transforaminal epidural space.   Additional Comments:  The patient tolerated the procedure well Dressing: 2 x 2 sterile gauze and Band-Aid     Post-procedure details: Patient was observed during the procedure. Post-procedure instructions were reviewed.  Patient left the clinic in stable condition.      Clinical History: MRI LUMBAR SPINE WITHOUT CONTRAST  TECHNIQUE: Multiplanar, multisequence MR imaging of the lumbar spine was performed. No intravenous contrast was administered.  COMPARISON:  None.  FINDINGS: Segmentation:  Standard.  Alignment:  Physiologic.  Vertebrae:  No fracture, evidence of discitis, or bone lesion.  Conus medullaris and cauda equina: Conus extends to the L1 level. Conus and cauda equina appear normal.  Paraspinal and other soft tissues: Bilateral renal cysts.  Disc levels:  T12-L1: No spinal canal or neural foraminal stenosis.  L1-2: Mild facet degenerative changes. No spinal canal or neural foraminal stenosis.  L2-3: Loss of height, disc bulge, facet degenerative changes and ligamentum flavum redundancy resulting in mild spinal canal stenosis and moderate bilateral neural foraminal narrowing.  L3-4: Loss of disc height, disc bulge, facet degenerative changes and ligamentum flavum redundancy which in association with prominence of dural fat resulting moderate spinal canal stenosis with effacement of the bilateral subarticular zones and moderate bilateral neural foraminal narrowing. Right greater than left.  L4-5: Loss of disc height, disc bulge, prominent facet degenerative changes and ligamentum flavum redundancy resulting in severe spinal canal stenosis, moderate right and mild left neural foraminal narrowing.  L5-S1: Disc bulge with associated central disc protrusion and facet degenerative changes resulting in moderate spinal canal stenosis with narrowing of the bilateral subarticular zones, moderate right and severe left neural foraminal narrowing.  IMPRESSION: 1. Multilevel degenerative changes of the lumbar spine as described above with severe spinal  canal stenosis at L4-L5 and moderate spinal canal stenosis at L3-L4 and L5-S1. 2. Moderate bilateral neural foraminal narrowing at L2-L3, L3-L4, and L4-L5. 3. Moderate right and severe left neural foraminal narrowing at L5-S1.   Electronically Signed   By: Pedro Earls M.D.   On: 03/10/2020 15:42     Objective:  VS:  HT:    WT:   BMI:     BP:(!) 160/87  HR:92bpm  TEMP: ( )  RESP:  Physical Exam Vitals and nursing note reviewed.  Constitutional:      General: He is not in acute distress.    Appearance: Normal appearance. He is obese. He is not ill-appearing.  HENT:     Head: Normocephalic and atraumatic.     Right Ear: External ear normal.     Left Ear: External ear normal.     Nose: No congestion.  Eyes:     Extraocular Movements: Extraocular movements intact.  Cardiovascular:     Rate and Rhythm: Normal rate.     Pulses: Normal pulses.  Pulmonary:     Effort: Pulmonary effort is normal. No respiratory distress.  Abdominal:     General: There is no distension.     Palpations: Abdomen is soft.  Musculoskeletal:        General: No tenderness or signs of injury.     Cervical back: Neck supple.     Right lower leg: No edema.     Left lower leg: No edema.  Comments: Patient has good distal strength without clonus.  Skin:    Findings: No erythema or rash.  Neurological:     General: No focal deficit present.     Mental Status: He is alert and oriented to person, place, and time.     Sensory: No sensory deficit.     Motor: No weakness or abnormal muscle tone.     Coordination: Coordination normal.  Psychiatric:        Mood and Affect: Mood normal.        Behavior: Behavior normal.      Imaging: No results found.

## 2020-07-03 ENCOUNTER — Ambulatory Visit: Payer: 59 | Admitting: Orthopaedic Surgery

## 2020-07-03 ENCOUNTER — Encounter: Payer: Self-pay | Admitting: Orthopaedic Surgery

## 2020-07-03 DIAGNOSIS — M1711 Unilateral primary osteoarthritis, right knee: Secondary | ICD-10-CM

## 2020-07-03 MED ORDER — HYDROCODONE-ACETAMINOPHEN 5-325 MG PO TABS: 1.0000 | ORAL_TABLET | Freq: Two times a day (BID) | ORAL | 0 refills | Status: DC | PRN

## 2020-07-03 NOTE — Progress Notes (Signed)
Office Visit Note   Patient: Jesus [name]           Date of Birth: 05-21-1955           MRN: [id number] Visit Date: 07/03/2020              Requested by: Maude Leriche, PA-C [place],  [place] [address] PCP: Maude Leriche, PA-C   Assessment & Plan: Visit Diagnoses:  1. Unilateral primary osteoarthritis, right knee     Plan: Impression is chronic right knee pain concerning for degenerative lateral meniscus tear.  We have discussed obtaining an MRI at this point to look for meniscal pathology and degree of arthritis.  He will follow up with [place] once that has been completed.  Have agreed to call in one small prescription of Norco to take in the meantime.  Follow-Up Instructions: Return for after mri.   Orders:  No orders of the defined types were placed in this encounter.  Meds ordered this encounter  Medications  . HYDROcodone-acetaminophen (NORCO) 5-325 MG tablet    Sig: Take 1 tablet by mouth 2 (two) times daily as needed.    Dispense:  10 tablet    Refill:  0      Procedures: No procedures performed   Clinical Data: No additional findings.   Subjective: Chief Complaint  Patient presents with  . Right Knee - Pain, Edema    HPI patient is a pleasant 65 year old gentleman who comes in today with continued right knee pain.  The pain he has is primarily to the lateral aspect but does note occasional medial pain as well.  No mechanical symptoms.  No instability.  His pain is described as sharp and shooting worse with pivoting.  He takes an occasional Norco which minimally helps his symptoms.  He has undergone previous cortisone injection which minimally helped.  Subsequent viscosupplementation injection made his pain worse.  No previous MRI of the right knee.  Of note, he was seen by [place] for bilateral lower extremity lumbar radiculopathy and underwent epidural steroid injection which significantly helped the symptoms.  Review of Systems as  detailed in HPI.  All others reviewed and are negative.   Objective: Vital Signs: There were no vitals taken for this visit.  Physical Exam well-developed well-nourished gentleman in no acute distress.  Alert and oriented x3.  Ortho Exam right knee exam shows a trace effusion.  Range of motion 0 to 110 degrees.  Lateral joint line tenderness.  Mild patellofemoral crepitus.  Ligaments are stable.  He is neurovascular intact distally.  Specialty Comments:  No specialty comments available.  Imaging: No new imaging   PMFS History: Patient Active Problem List   Diagnosis Date Noted  . Chronic pain of right knee 10/17/2018  . Large cell (diffuse) non-Hodgkin's lymphoma ([place]) 05/10/2018  . Bacteremia due to Escherichia coli   . HTN (hypertension) 04/17/2018  . RLS (restless legs syndrome) 04/17/2018  . Abnormal LFTs 04/17/2018  . Acute metabolic encephalopathy 17/40/8144  . AKI (acute kidney injury) ([place]) 04/16/2018  . Dehydration   . Fever, unspecified   . Sepsis ([place])   . Disorientation   . Hypokalemia   . Hypomagnesemia   . Anemia   . Encounter for antineoplastic chemotherapy   . At high risk of tumor lysis syndrome   . Swelling of lower leg   . Diffuse large B cell lymphoma ([place]) 01/15/2018  . Diffuse large B-cell lymphoma of lymph nodes of multiple  regions ([place]) 01/12/2018  . Counseling regarding advance care planning and goals of care 01/12/2018  . Bilateral leg pain 05/27/2014   Past Medical History:  Diagnosis Date  . Allergy   . Anemia    during chemo  . Arthritis    knee   . Blood transfusion without reported diagnosis   . Cancer ([facility])    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- last radiation 06-2018  . Cataract    removed both eyes with l;ens implants   . Family history of colon cancer    in his brother- dx'd age 31   . History of chemotherapy    last 06-01-2018  . History of kidney stones   . History of radiation therapy     last radiation 06-2018  . Hyperlipidemia    currently under control  . Hypertension   . Irregular heart beats   . Lymphadenopathy   . Pain, lower leg    Bilateral  . Peripheral arterial disease ([place])   . Pre-diabetes   . Red-green color blindness   . RLS (restless legs syndrome)   . Snores   . Wears glasses     Family History  Problem Relation Age of Onset  . Breast cancer Mother   . Diabetes Father   . Hypertension Father   . Stroke Father   . Mental illness Sister   . Hypertension Daughter   . Mental illness Daughter   . Hypertension Brother   . Colon cancer Brother 100       passed away 12/27/2018  . Breast cancer Sister   . Esophageal cancer Neg Hx   . Colon polyps Neg Hx   . Rectal cancer Neg Hx   . Stomach cancer Neg Hx     Past Surgical History:  Procedure Laterality Date  . CATARACT EXTRACTION W/ INTRAOCULAR LENS  IMPLANT, BILATERAL    . COLONOSCOPY    . dislodged salava stone    . FRACTURE SURGERY    . HAND ARTHROPLASTY  1995   crushed left hand  . INGUINAL LYMPH NODE BIOPSY Left 01/02/2018   Procedure: LEFT INGUINAL LYMPH NODE BIOPSY;  Surgeon: Rolm Bookbinder, MD;  Location: [facility];  Service: General;  Laterality: Left;  . IR IMAGING GUIDED PORT INSERTION  01/15/2018  . IR REMOVAL TUN ACCESS W/ PORT W/O FL MOD SED  03/11/2019  . MICROLARYNGOSCOPY Left 01/17/2014   Procedure: MICROLARYNGOSCOPY WITH EXCISION OF THE BIOPSY OF LEFT VOCAL CORD LESION;  Surgeon: Izora Gala, MD;  Location: [facility];  Service: ENT;  Laterality: Left;  . ORIF FOOT FRACTURE  2005   left  . REFRACTIVE SURGERY Right    removed cloudiness in right eye after cataract removal    Social History   Occupational History  . Not on file  Tobacco Use  . Smoking status: Current Every Day Smoker    Packs/day: 0.50    Years: 36.00    Pack years: 18.00    Types: Cigarettes  . Smokeless tobacco: Never Used  . Tobacco comment: he denies smoking in about 2 weeks 07/13/18   Vaping Use  . Vaping Use: Never used  Substance and Sexual Activity  . Alcohol use: Yes    Alcohol/week: 11.0 standard drinks    Types: 5 Shots of liquor, 6 Cans of beer per week    Comment: weekends, he denies current alcohol use 07/13/18  . Drug use: Not Currently    Types:  Cocaine    Comment: reports cocaine usage ~2X/ month; last use 12/26/17  . Sexual activity: Not on file

## 2020-07-09 ENCOUNTER — Other Ambulatory Visit: Payer: 59 | Admitting: Physician Assistant

## 2020-07-09 DIAGNOSIS — R222 Localized swelling, mass and lump, trunk: Secondary | ICD-10-CM

## 2020-07-21 ENCOUNTER — Ambulatory Visit
Admission: EM | Admit: 2020-07-21 | Discharge: 2020-07-21 | Disposition: A | Discharge status: Home / Self Care | Payer: 59 | Attending: Emergency Medicine | Admitting: Emergency Medicine

## 2020-07-21 ENCOUNTER — Encounter: Payer: Self-pay | Admitting: Emergency Medicine

## 2020-07-21 ENCOUNTER — Other Ambulatory Visit: Payer: Self-pay

## 2020-07-21 DIAGNOSIS — B360 Pityriasis versicolor: Secondary | ICD-10-CM | POA: Diagnosis not present

## 2020-07-21 DIAGNOSIS — R490 Dysphonia: Secondary | ICD-10-CM

## 2020-07-21 MED ORDER — KETOCONAZOLE 2 % EX CREA: 1.0000 "application " | TOPICAL_CREAM | Freq: Every day | CUTANEOUS | 0 refills | Status: AC

## 2020-07-21 MED ORDER — FLUCONAZOLE 150 MG PO TABS: 150.0000 mg | ORAL_TABLET | ORAL | 0 refills | Status: AC

## 2020-07-21 MED ORDER — BENZONATATE 200 MG PO CAPS: 200.0000 mg | ORAL_CAPSULE | Freq: Three times a day (TID) | ORAL | 0 refills | Status: AC | PRN

## 2020-07-21 MED ORDER — PREDNISONE 50 MG PO TABS: 50.0000 mg | ORAL_TABLET | Freq: Every day | ORAL | 0 refills | Status: AC

## 2020-07-21 MED ORDER — CETIRIZINE HCL 10 MG PO CAPS: 10.0000 mg | ORAL_CAPSULE | Freq: Every day | ORAL | 0 refills | Status: DC

## 2020-07-21 NOTE — Discharge Instructions (Signed)
Please follow-up with ENT Prednisone daily for the next 5 days Daily cetirizine x1 week Tessalon every 8 hours for cough  Diflucan once weekly for the next 4 weeks to treat fungal rash Ketoconazole cream daily for the next 2 weeks  Follow-up if not improving or worsening

## 2020-07-21 NOTE — ED Triage Notes (Signed)
Patient c/o hoarseness x 2 weeks.   Patient denies pain.   Patient tried hot liquids with no relief of symptoms.   Patient endorses smoking cigarettes.  Patient states he had surgery on throat about 6 years ago.

## 2020-07-21 NOTE — ED Provider Notes (Signed)
[facility]    CSN: [id number] Arrival date & time: 07/21/20  1640      History   Chief Complaint Chief Complaint  Patient presents with  . Hoarse    HPI Jesus [name] is a 65 y.o. male history of non-Hodgkin's lymphoma, large B-cell lymphoma, presenting today for evaluation of hoarseness.  Reports hoarseness x2 weeks.  Denies associated pain.  Using hot liquids without relief.  Patient does admit to tobacco use and cigarette smoking.  Reports prior history of throat surgery 6 years ago.  Cyst on larynx with similar symptoms.  Has had a mild cough which is worse at nighttime.  Denies any significant URI symptoms.  Also requesting medicine for his tinea versicolor which is flared up recently.  HPI  Past Medical History:  Diagnosis Date  . Allergy   . Anemia    during chemo  . Arthritis    knee   . Blood transfusion without reported diagnosis   . Cancer ([place])    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- last radiation 06-2018  . Cataract    removed both eyes with l;ens implants   . Family history of colon cancer    in his brother- dx'd age 40   . History of chemotherapy    last 06-01-2018  . History of kidney stones   . History of radiation therapy    last radiation 06-2018  . Hyperlipidemia    currently under control  . Hypertension   . Irregular heart beats   . Lymphadenopathy   . Pain, lower leg    Bilateral  . Peripheral arterial disease ([place])   . Pre-diabetes   . Red-green color blindness   . RLS (restless legs syndrome)   . Snores   . Wears glasses     Patient Active Problem List   Diagnosis Date Noted  . Chronic pain of right knee 10/17/2018  . Large cell (diffuse) non-Hodgkin's lymphoma ([place]) 05/10/2018  . Bacteremia due to Escherichia coli   . HTN (hypertension) 04/17/2018  . RLS (restless legs syndrome) 04/17/2018  . Abnormal LFTs 04/17/2018  . Acute metabolic encephalopathy 21/19/4174  . AKI (acute  kidney injury) ([place]) 04/16/2018  . Dehydration   . Fever, unspecified   . Sepsis ([place])   . Disorientation   . Hypokalemia   . Hypomagnesemia   . Anemia   . Encounter for antineoplastic chemotherapy   . At high risk of tumor lysis syndrome   . Swelling of lower leg   . Diffuse large B cell lymphoma ([place]) 01/15/2018  . 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[place]) 01/12/2018  . Counseling regarding advance care planning and goals of care 01/12/2018  . Bilateral leg pain 05/27/2014    Past Surgical History:  Procedure Laterality Date  . CATARACT EXTRACTION W/ INTRAOCULAR LENS  IMPLANT, BILATERAL    . COLONOSCOPY    . dislodged salava stone    . FRACTURE SURGERY    . HAND ARTHROPLASTY  1995   crushed left hand  . INGUINAL LYMPH NODE BIOPSY Left 01/02/2018   Procedure: LEFT INGUINAL LYMPH NODE BIOPSY;  Surgeon: Rolm Bookbinder, MD;  Location: [facility];  Service: General;  Laterality: Left;  . IR IMAGING GUIDED PORT INSERTION  01/15/2018  . IR REMOVAL TUN ACCESS W/ PORT W/O FL MOD SED  03/11/2019  . MICROLARYNGOSCOPY Left 01/17/2014   Procedure: MICROLARYNGOSCOPY WITH EXCISION OF THE BIOPSY OF LEFT VOCAL  CORD LESION;  Surgeon: Izora Gala, MD;  Location: [facility];  Service: ENT;  Laterality: Left;  . ORIF FOOT FRACTURE  2005   left  . REFRACTIVE SURGERY Right    removed cloudiness in right eye after cataract removal        Home Medications    Prior to Admission medications   Medication Sig Start Date End Date Taking? Authorizing Provider  benzonatate (TESSALON) 200 MG capsule Take 1 capsule (200 mg total) by mouth 3 (three) times daily as needed for up to 7 days for cough. 07/21/20 07/28/20 Yes Nusrat Encarnacion C, PA-C  Cetirizine HCl 10 MG CAPS Take 1 capsule (10 mg total) by mouth daily for 10 days. 07/21/20 07/31/20 Yes Shontez Sermon C, PA-C  fluconazole (DIFLUCAN) 150 MG tablet Take 1 tablet (150 mg total) by mouth once a week for 4 doses.  07/21/20 08/12/20 Yes Makensie Mulhall C, PA-C  ketoconazole (NIZORAL) 2 % cream Apply 1 application topically daily for 14 days. 07/21/20 08/04/20 Yes Bowdy Bair C, PA-C  predniSONE (DELTASONE) 50 MG tablet Take 1 tablet (50 mg total) by mouth daily with breakfast for 5 days. 07/21/20 07/26/20 Yes Darlynn Ricco C, PA-C  amLODipine (NORVASC) 10 MG tablet TAKE 1 TABLET BY MOUTH EVERY DAY 02/19/20   Wendie Agreste, MD  aspirin 81 MG tablet Take 81 mg by mouth daily.    [provider]  B-COMPLEX-C PO Take by mouth.    [provider]  glucosamine-chondroitin 500-400 MG tablet Take 1 tablet by mouth 3 (three) times daily.    [provider]  HYDROcodone-acetaminophen (NORCO) 5-325 MG tablet Take 1 tablet by mouth 2 (two) times daily as needed. 07/03/20   Aundra Dubin, PA-C  lisinopril (ZESTRIL) 20 MG tablet TAKE 1 TABLET BY MOUTH EVERY DAY 02/19/20   Wendie Agreste, MD  Multiple Vitamins-Minerals (CENTRUM SILVER 50+MEN PO) Take by mouth.    [provider]  tadalafil (CIALIS) 5 MG tablet TAKE 0.5-1 TABLETS (2.5-5 MG TOTAL) BY MOUTH DAILY AS NEEDED FOR ERECTILE DYSFUNCTION. 04/22/20   Wendie Agreste, MD  triamcinolone ointment (KENALOG) 0.5 % Apply 1 application topically 2 (two) times daily. 07/30/19   Hall-Potvin, [place], PA-C    Family History Family History  Problem Relation Age of Onset  . Breast cancer Mother   . Diabetes Father   . Hypertension Father   . Stroke Father   . Mental illness Sister   . Hypertension Daughter   . Mental illness Daughter   . Hypertension Brother   . Colon cancer Brother 21       passed away 12-09-2018  . Breast cancer Sister   . Esophageal cancer Neg Hx   . Colon polyps Neg Hx   . Rectal cancer Neg Hx   . Stomach cancer Neg Hx     Social History Social History   Tobacco Use  . Smoking status: Current Every Day Smoker    Packs/day: 0.50    Years: 36.00    Pack years: 18.00    Types: Cigarettes  .  Smokeless tobacco: Never Used  . Tobacco comment: he denies smoking in about 2 weeks 07/13/18  Vaping Use  . Vaping Use: Never used  Substance Use Topics  . Alcohol use: Yes    Alcohol/week: 11.0 standard drinks    Types: 5 Shots of liquor, 6 Cans of beer per week    Comment: weekends, he denies current alcohol use 07/13/18  . Drug  use: Not Currently    Types: Cocaine    Comment: reports cocaine usage ~2X/ month; last use 12/26/17     Allergies   Bee venom   Review of Systems Review of Systems  Constitutional: Negative for activity change, appetite change, chills, fatigue and fever.  HENT: Positive for voice change. Negative for congestion, ear pain, rhinorrhea, sinus pressure, sore throat and trouble swallowing.   Eyes: Negative for discharge and redness.  Respiratory: Negative for cough, chest tightness and shortness of breath.   Cardiovascular: Negative for chest pain.  Gastrointestinal: Negative for abdominal pain, diarrhea, nausea and vomiting.  Musculoskeletal: Negative for myalgias.  Skin: Positive for rash.  Neurological: Negative for dizziness, light-headedness and headaches.     Physical Exam Triage Vital Signs ED Triage Vitals [07/21/20 1709]  Enc Vitals Group     BP      Pulse      Resp      Temp      Temp src      SpO2      Weight      Height      Head Circumference      Peak Flow      Pain Score 0     Pain Loc      Pain Edu?      Excl. in [place]?    No data found.  Updated Vital Signs BP (!) 149/75 (BP Location: Right Arm)   Pulse 95   Temp 97.9 F (36.6 C) (Oral)   Resp 18   SpO2 93%   Visual Acuity Right Eye Distance:   Left Eye Distance:   Bilateral Distance:    Right Eye Near:   Left Eye Near:    Bilateral Near:     Physical Exam Vitals and nursing note reviewed.  Constitutional:      Appearance: He is well-developed.     Comments: No acute distress  HENT:     Head: Normocephalic and atraumatic.     Ears:     Comments: Bilateral  ears without tenderness to palpation of external auricle, tragus and mastoid, EAC's without erythema or swelling, TM's with good bony landmarks and cone of light. Non erythematous.     Nose: Nose normal.     Mouth/Throat:     Comments: Oral mucosa pink and moist, no tonsillar enlargement or exudate. Posterior pharynx patent and nonerythematous, no uvula deviation or swelling.  Voice hoarse Eyes:     Conjunctiva/sclera: Conjunctivae normal.  Cardiovascular:     Rate and Rhythm: Normal rate.  Pulmonary:     Effort: Pulmonary effort is normal. No respiratory distress.     Comments: Breathing comfortably at rest, CTABL, no wheezing, rales or other adventitious sounds auscultated Abdominal:     General: There is no distension.  Musculoskeletal:        General: Normal range of motion.     Cervical back: Neck supple.  Skin:    General: Skin is warm and dry.     Comments: Areas of hyperpigmentation noted to neck and antecubital areas  Neurological:     Mental Status: He is alert and oriented to person, place, and time.      UC Treatments / Results  Labs (all labs ordered are listed, but only abnormal results are displayed) Labs Reviewed - No data to display  EKG   Radiology No results found.  Procedures Procedures (including critical care time)  Medications Ordered in UC Medications - No data to display  Initial Impression / Assessment and Plan / UC Course  I have reviewed the triage vital signs and the nursing notes.  Pertinent labs & imaging results that were available during my care of the patient were reviewed by me and considered in my medical decision making (see chart for details).     Hoarseness-possibly inflammatory from cough/postnasal drainage, placing on course of prednisone, initiating on daily antihistamine and cough medicine, but given history of prior cancer and tobacco use encouraging follow-up with ENT for further evaluation.  Contact provided.  Tinea  versicolor-ketoconazole topically, Diflucan orally weekly x4 weeks.  Discussed strict return precautions. Patient verbalized understanding and is agreeable with plan.  Final Clinical Impressions(s) / UC Diagnoses   Final diagnoses:  Hoarseness  Tinea versicolor     Discharge Instructions     Please follow-up with ENT Prednisone daily for the next 5 days Daily cetirizine x1 week Tessalon every 8 hours for cough  Diflucan once weekly for the next 4 weeks to treat fungal rash Ketoconazole cream daily for the next 2 weeks  Follow-up if not improving or worsening    ED Prescriptions    Medication Sig Dispense Auth. Provider   predniSONE (DELTASONE) 50 MG tablet Take 1 tablet (50 mg total) by mouth daily with breakfast for 5 days. 5 tablet Kassadie Pancake C, PA-C   Cetirizine HCl 10 MG CAPS Take 1 capsule (10 mg total) by mouth daily for 10 days. 10 capsule Jarris Kortz C, PA-C   benzonatate (TESSALON) 200 MG capsule Take 1 capsule (200 mg total) by mouth 3 (three) times daily as needed for up to 7 days for cough. 28 capsule Suren Payne C, PA-C   fluconazole (DIFLUCAN) 150 MG tablet Take 1 tablet (150 mg total) by mouth once a week for 4 doses. 4 tablet Dawon Troop C, PA-C   ketoconazole (NIZORAL) 2 % cream Apply 1 application topically daily for 14 days. 30 g Etheridge Geil, [place] C, PA-C     PDMP not reviewed this encounter.   Janith Lima, PA-C 07/21/20 1740

## 2020-07-22 ENCOUNTER — Ambulatory Visit
Admission: RE | Admit: 2020-07-22 | Discharge: 2020-07-22 | Disposition: A | Discharge status: Home / Self Care | Payer: 59 | Source: Ambulatory Visit | Attending: Orthopaedic Surgery | Admitting: Orthopaedic Surgery

## 2020-07-22 ENCOUNTER — Ambulatory Visit
Admission: RE | Admit: 2020-07-22 | Discharge: 2020-07-22 | Disposition: A | Discharge status: Home / Self Care | Payer: 59 | Source: Ambulatory Visit | Attending: Physician Assistant | Admitting: Physician Assistant

## 2020-07-22 DIAGNOSIS — R222 Localized swelling, mass and lump, trunk: Secondary | ICD-10-CM

## 2020-07-22 DIAGNOSIS — M1711 Unilateral primary osteoarthritis, right knee: Secondary | ICD-10-CM

## 2020-07-22 IMAGING — MR MR CHEST MEDIASTINUM WO/W CM
8 series · 16 of 16 positions shown · IV contrast (multihance)
Comparison: CT chest [DATE].

CLINICAL DATA: Rt anterior chest mass x 1 yr. Pain x 2 mths. Hx non
hodgkin's lymphoma. No known injury.

EXAM:
MRI CHEST WITHOUT AND WITH CONTRAST
TECHNIQUE: Multi planar and multisequence MRI of the right anterior chest was
performed prior to and following intravenous contrast.
CONTRAST:  20mL MULTIHANCE GADOBENATE DIMEGLUMINE 529 MG/ML IV SOLN

[Series 4: T1 · axial · right · 4.0mm · 0.56mm/px · z∈[-84,+55]mm · 2 of 30 slices shown (1 of 2)]
[im 1/30]
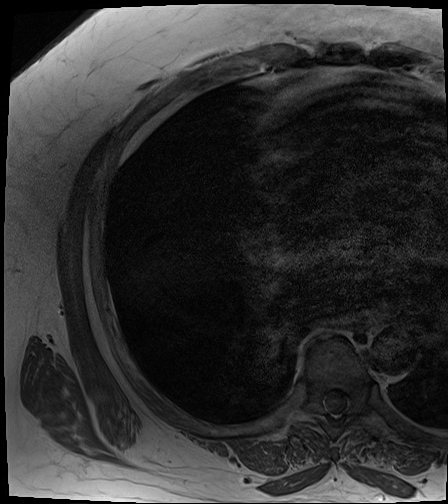
[im 30/30]
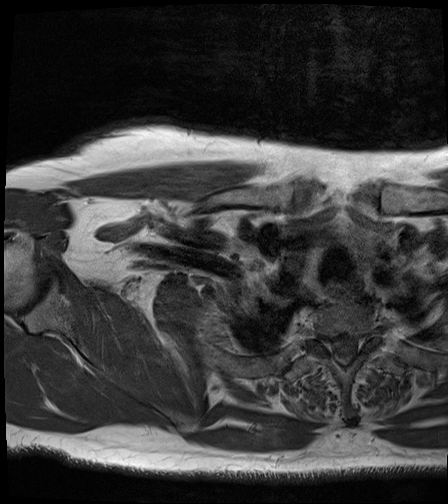

[Series 5: T2 fat-sat · axial · right · 4.0mm · 0.56mm/px · z∈[-84,+55]mm · 2 of 30 slices shown (1 of 2)]
[im 1/30]
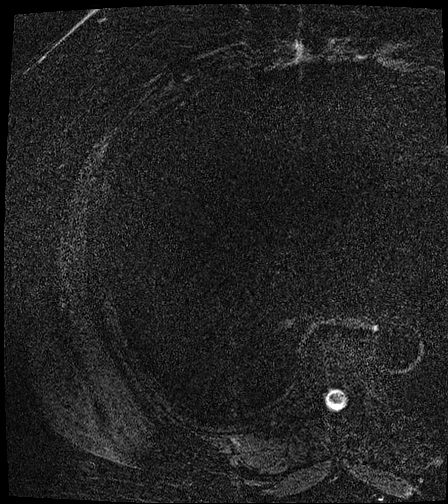
[im 30/30]
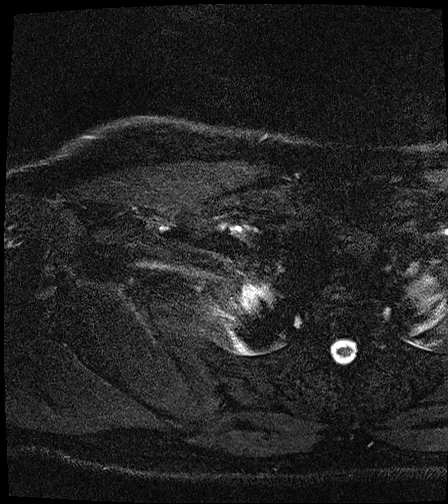

[Series 6: T1 · coronal · right · 4.0mm · 0.78mm/px · 2 of 29 slices shown (2 of 2)]
[im 1/29]
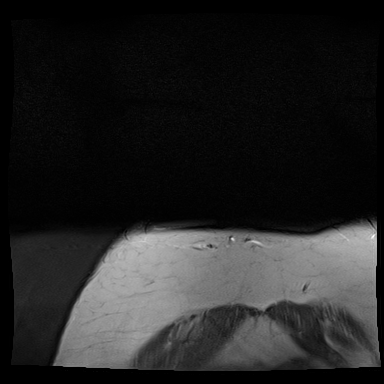
[im 29/29]
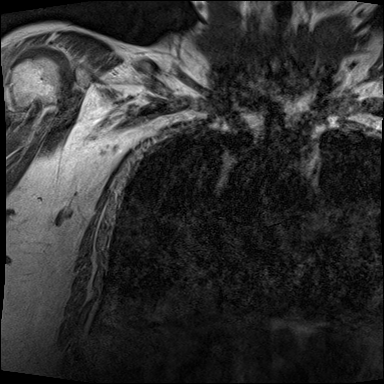

[Series 7: T2 fat-sat · coronal · right · 4.0mm · 0.78mm/px · 2 of 29 slices shown (2 of 2)]
[im 1/29]
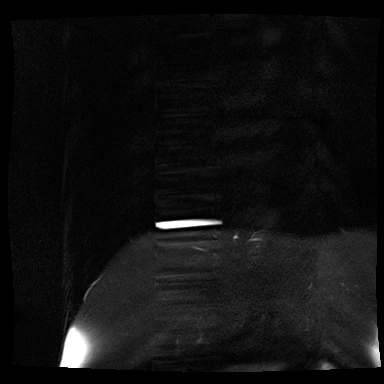
[im 29/29]
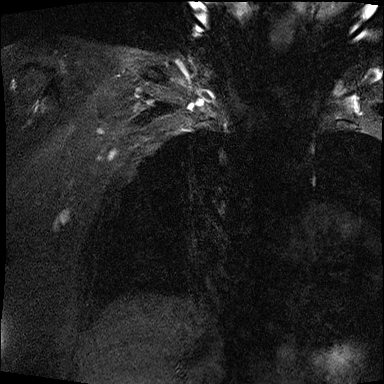

[Series 9: STIR · sagittal · right · 4.0mm · 0.94mm/px · 2 of 35 slices shown]
[im 1/35]
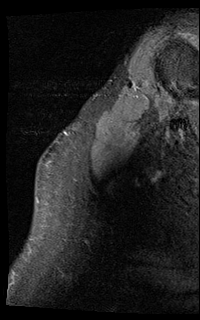
[im 35/35]
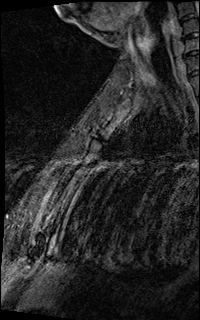

[Series 10: T1 fat-sat · axial · right · 4.0mm · 0.78mm/px · z∈[-84,+55]mm · 2 of 30 slices shown]
[im 1/30]
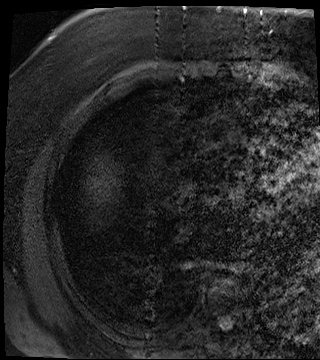
[im 30/30]
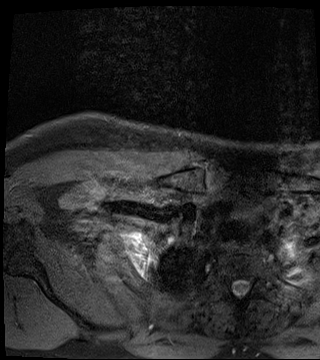

[Series 11: T1 fat-sat post-contrast · axial · right · 4.0mm · 0.78mm/px · z∈[-84,+55]mm · 2 of 30 slices shown (1 of 2)]
[im 1/30]
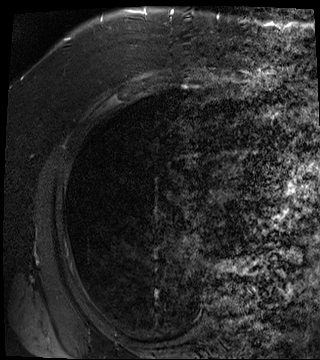
[im 30/30]
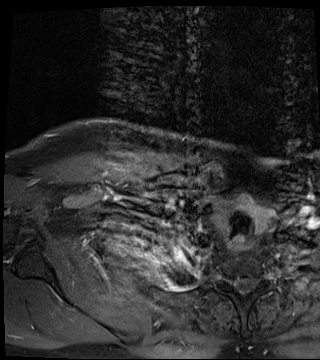

[Series 12: T1 fat-sat post-contrast · sagittal · right · 4.0mm · 0.94mm/px · 2 of 35 slices shown (2 of 2)]
[im 1/35]
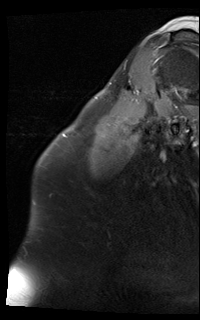
[im 35/35]
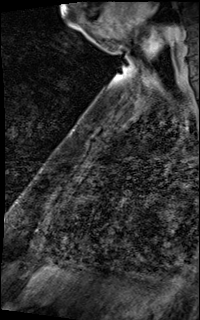

[16 of 16 positions shown; findings below may reference images not displayed]

FINDINGS: Bones/Joint/Cartilage

No marrow signal abnormality. No fracture or dislocation. Normal
alignment. No joint effusion.

Ligaments, Muscles and Tendons
Muscles are normal.

Soft tissue
No fluid collection or hematoma. 5 mm T2 hyperintense, T1
intermediate signal, enhancing nodule within the subcutaneous fat
just beneath the skin surface along the medial aspect of the right
anterior chest wall at the level of the nipple. The nodule is
located just medial to the skin markers.

Just lateral to the upper skin marker there is a second 5 mm
enhancing nodule beneath the skin surface.

No other soft tissue mass, fluid collection or hematoma.
IMPRESSION: A 5 mm soft tissue nodule within the subcutaneous fat just beneath
the skin surface along the medial aspect of the right anterior chest
wall at the level of the nipple. The nodule is located just medial
to the skin markers. Just lateral to the upper skin marker there is
a second 5 mm enhancing nodule beneath the skin surface. These are
of indeterminate etiology, but are unchanged compared with
[DATE].

## 2020-07-22 IMAGING — MR MR KNEE*R* W/O CM
6 series · 38 of 40 positions shown · non-contrast
Comparison: None.

CLINICAL DATA: Right knee pain for 3-4 years.

EXAM:
MRI OF THE RIGHT KNEE WITHOUT CONTRAST
TECHNIQUE: Multiplanar, multisequence MR imaging of the knee was performed. No
intravenous contrast was administered.

[Series 3: T2 fat-sat · axial · right · 4.0mm · 0.56mm/px · z∈[-18,+135]mm · 7 of 36 slices shown (1 of 3)]
[im 1/36]
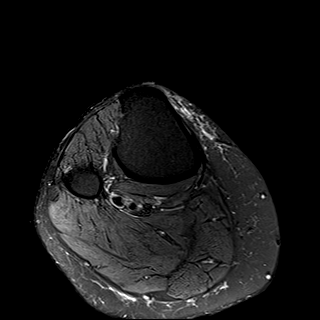
[im 6/36]
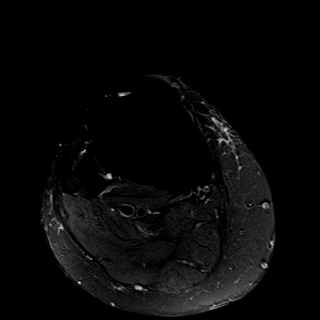
[im 12/36]
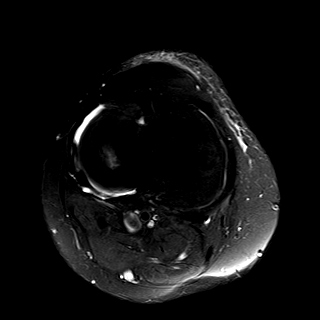
[im 18/36]
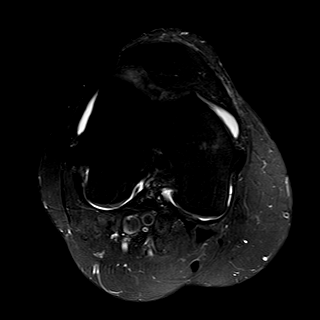
[im 24/36]
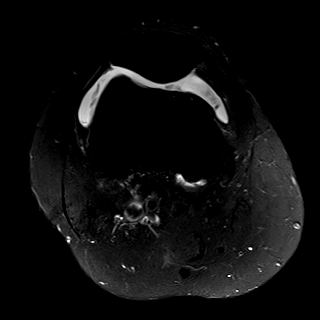
[im 30/36]
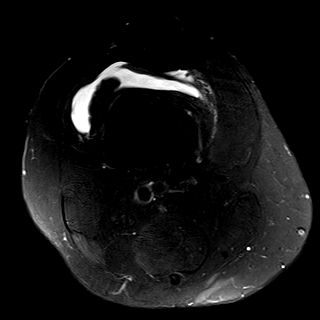
[im 36/36]
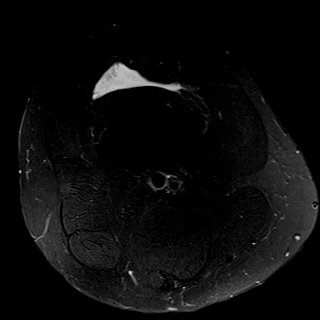

[Series 4: T2 fat-sat · coronal · right · 4.0mm · 0.47mm/px · 7 of 37 slices shown (2 of 3)]
[im 1/37]
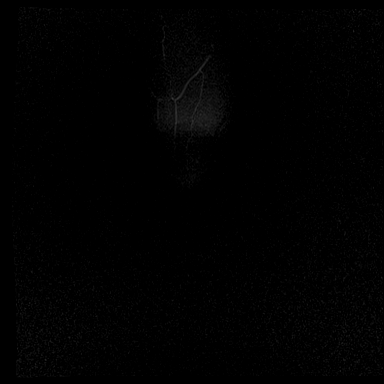
[im 7/37]
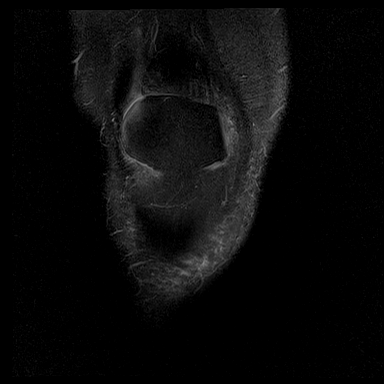
[im 13/37]
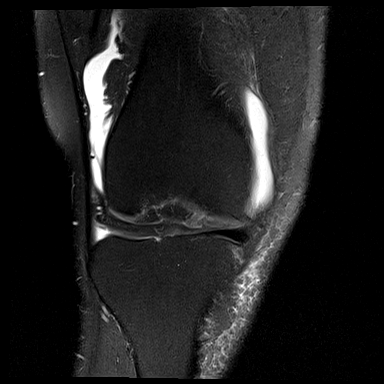
[im 19/37]
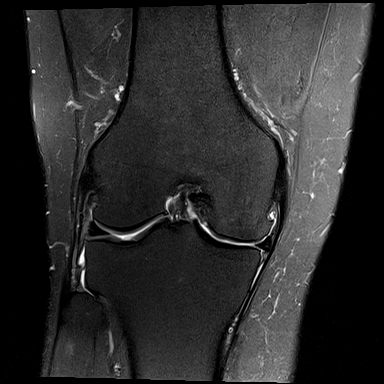
[im 25/37]
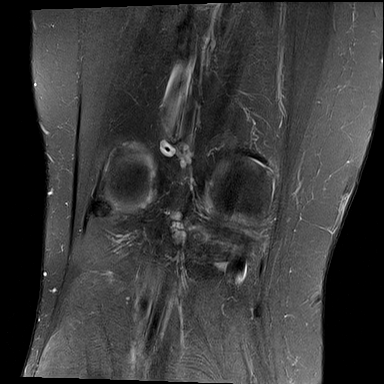
[im 31/37]
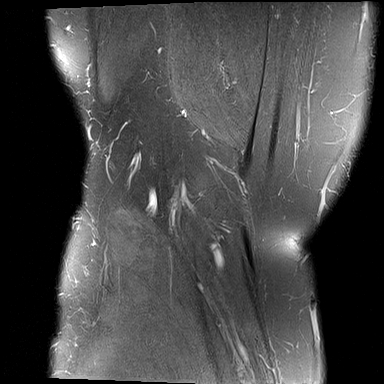
[im 37/37]
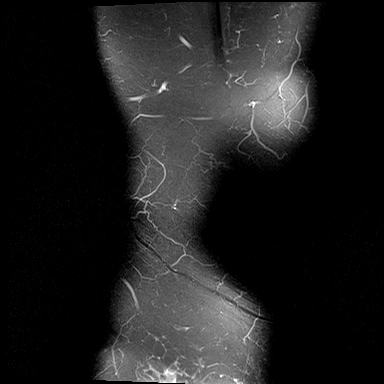

[Series 5: T1 · coronal · right · 4.0mm · 0.47mm/px · 5 of 37 slices shown]
[im 1/37]
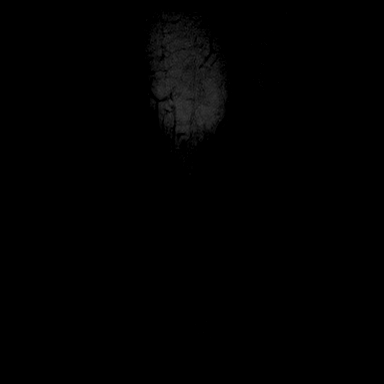
[im 7/37]
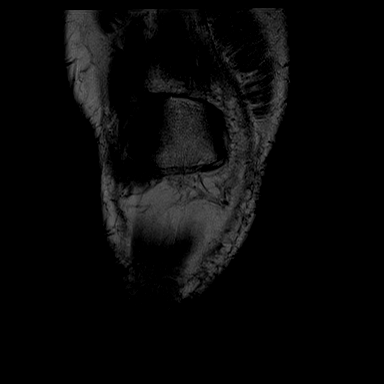
[im 13/37]
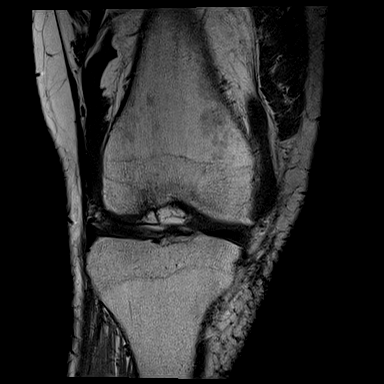
[im 19/37]
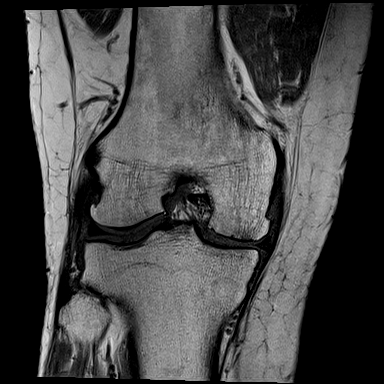
[im 25/37]
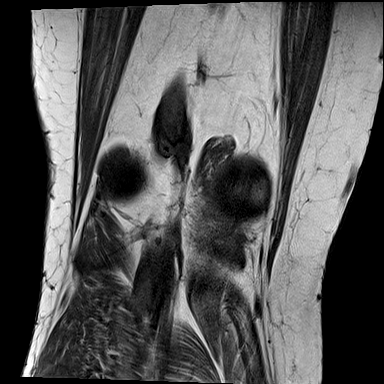

[Series 6: PD fat-sat · coronal · right · 4.0mm · 0.47mm/px · 7 of 37 slices shown (1 of 2)]
[im 1/37]
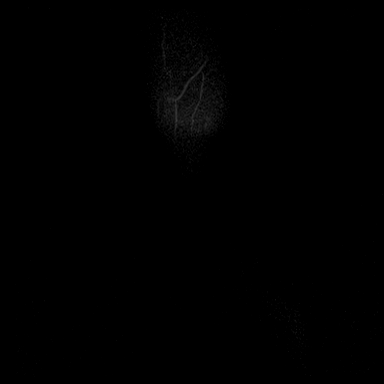
[im 7/37]
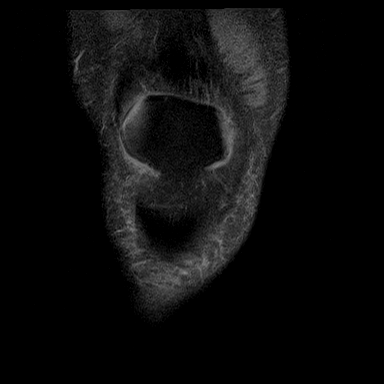
[im 13/37]
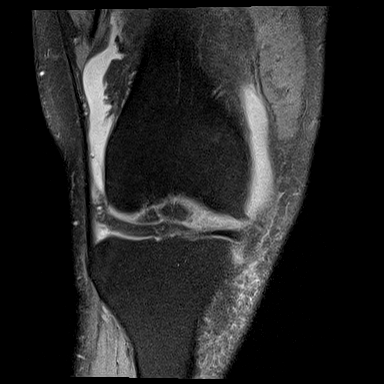
[im 19/37]
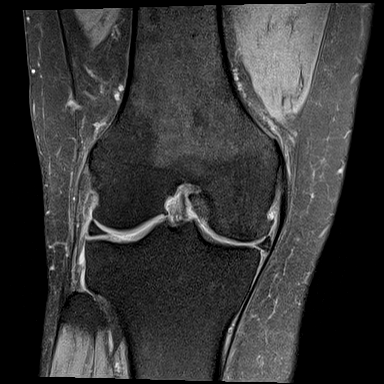
[im 25/37]
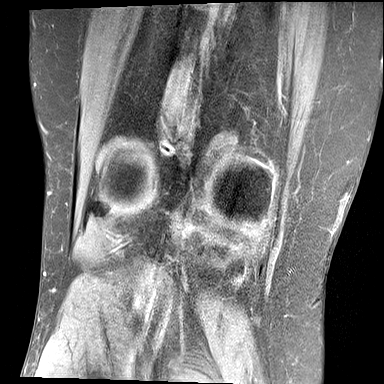
[im 31/37]
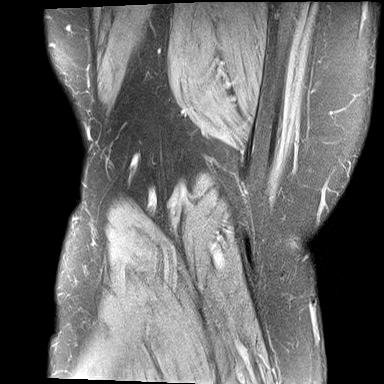
[im 37/37]
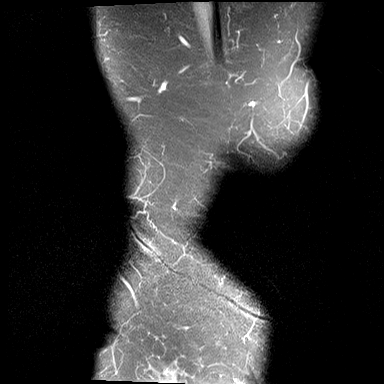

[Series 8: T2 fat-sat · sagittal · right · 3.2mm · 0.56mm/px · 6 of 33 slices shown (3 of 3)]
[im 1/33]
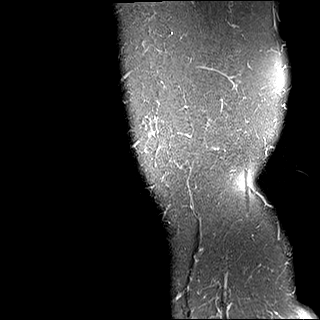
[im 7/33]
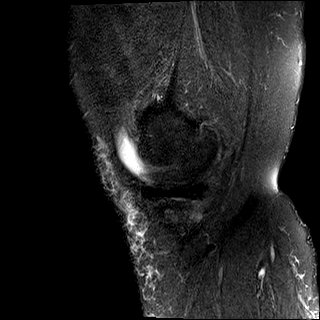
[im 13/33]
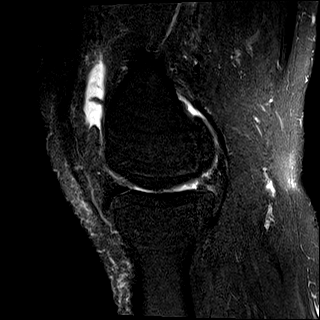
[im 20/33]
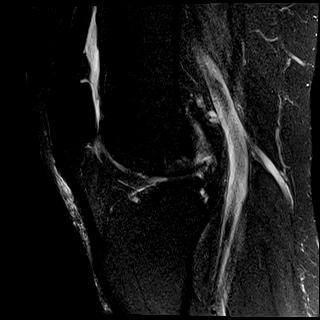
[im 26/33]
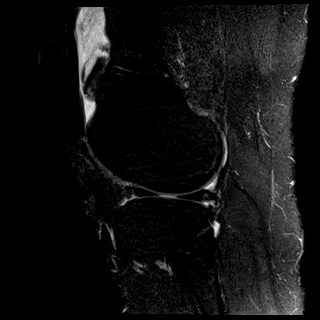
[im 33/33]
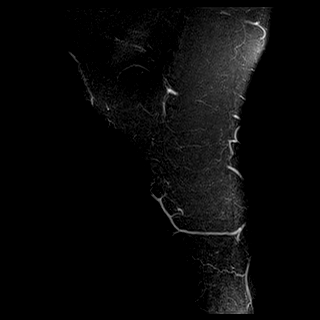

[Series 9: PD fat-sat · sagittal · right · 3.2mm · 0.56mm/px · 6 of 33 slices shown (2 of 2)]
[im 1/33]
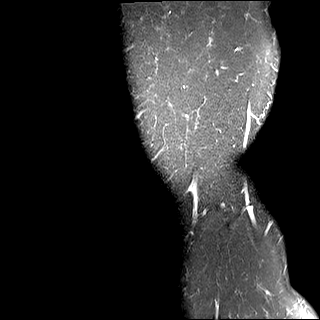
[im 7/33]
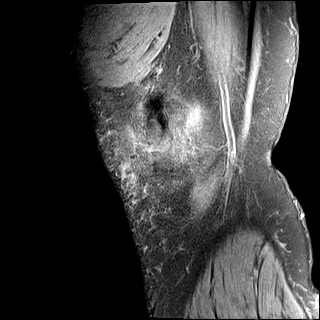
[im 13/33]
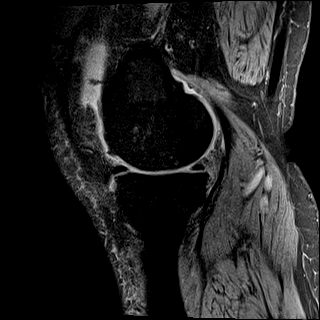
[im 20/33]
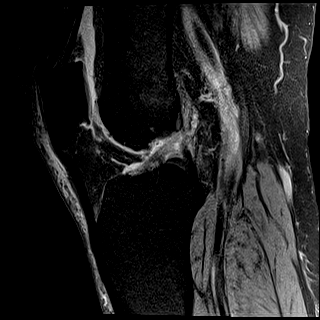
[im 26/33]
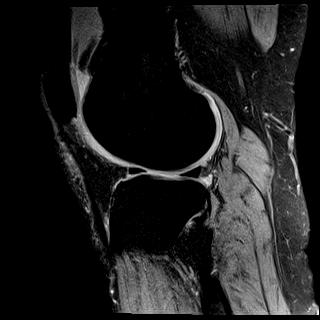
[im 33/33]
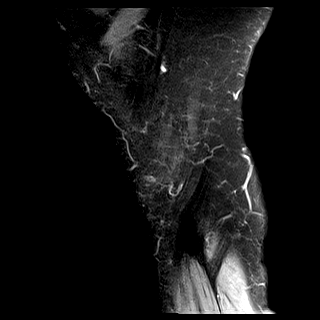

[38 of 40 positions shown; findings below may reference images not displayed]

FINDINGS: MENISCI

Medial: Large radial tear of the posterior horn of the medial
meniscus towards the meniscal root with peripheral meniscal
extrusion.

Lateral: Intact.

LIGAMENTS

Cruciates: ACL and PCL are intact.

Collaterals: Medial collateral ligament is intact. Lateral
collateral ligament complex is intact.

CARTILAGE

Patellofemoral: Partial-thickness cartilage loss of the inferior
aspect of the trochlea. And inferior aspect of the lateral patellar
facet

Medial: High-grade partial-thickness cartilage loss of the medial
femorotibial compartment.

Lateral: Mild partial-thickness cartilage loss of the lateral tibial
plateau.

JOINT: Moderate joint effusion. Normal BEANS. No plical
thickening.

POPLITEAL FOSSA: Popliteus tendon is intact. No Baker's cyst.

EXTENSOR MECHANISM: Intact quadriceps tendon. Intact patellar
tendon. Intact lateral patellar retinaculum. Intact medial patellar
retinaculum. Intact MPFL.

BONES: No aggressive osseous lesion. No fracture or dislocation.
Tiny tricompartmental marginal osteophytes.

Other: No fluid collection or hematoma. Muscles are normal.
IMPRESSION: 1. Large radial tear of the posterior horn of the medial meniscus
towards the meniscal root with peripheral meniscal extrusion.
2. Tricompartmental cartilage abnormalities as described above.
3. Moderate joint effusion.

## 2020-07-22 MED ORDER — GADOBENATE DIMEGLUMINE 529 MG/ML IV SOLN
20.0000 mL | Freq: Once | INTRAVENOUS | Status: AC | PRN
  Administered 2020-07-22: 20 mL via INTRAVENOUS

## 2020-07-23 NOTE — Progress Notes (Signed)
Needs appt.  Thanks.

## 2020-08-08 ENCOUNTER — Other Ambulatory Visit: Payer: 59

## 2020-08-11 ENCOUNTER — Other Ambulatory Visit: Payer: Self-pay | Admitting: Otolaryngology

## 2020-08-11 ENCOUNTER — Ambulatory Visit: Payer: 59 | Admitting: Orthopaedic Surgery

## 2020-08-11 DIAGNOSIS — R490 Dysphonia: Secondary | ICD-10-CM | POA: Insufficient documentation

## 2020-08-12 ENCOUNTER — Other Ambulatory Visit: Payer: Self-pay | Admitting: Otolaryngology

## 2020-08-12 DIAGNOSIS — R490 Dysphonia: Secondary | ICD-10-CM

## 2020-08-18 ENCOUNTER — Ambulatory Visit: Payer: 59 | Admitting: Orthopaedic Surgery

## 2020-08-18 DIAGNOSIS — M1711 Unilateral primary osteoarthritis, right knee: Secondary | ICD-10-CM

## 2020-08-18 DIAGNOSIS — Z6841 Body Mass Index (BMI) 40.0 and over, adult: Secondary | ICD-10-CM

## 2020-08-18 NOTE — Progress Notes (Signed)
Office Visit Note   Patient: Jesus [name]           Date of Birth: 1956/03/31           MRN: [id number] Visit Date: 08/18/2020              Requested by: Shirline Frees, MD [address],  [place] [address] PCP: Shirline Frees, MD   Assessment & Plan: Visit Diagnoses:  1. Primary osteoarthritis of right knee   2. Body mass index 40.0-44.9, adult ([place])   3. Morbid obesity ([place])     Plan: MRI shows tricompartmental degenerative changes worse in the medial compartment with areas of high-grade partial-thickness cartilage loss.  He also has a large radial tear of the posterior horn of the medial meniscus with meniscal extrusion.  Overall his symptoms are more consistent with the degenerative findings.  He has had cortisone as well as Visco injections in the past without much relief.  He is not interested in repeating these.  For now he will wear knee brace during activity and take care of the throat stuff first.  We talked about the importance of strengthening and weight loss and how this impacts knee pain.  Questions encouraged and answered.  The patient meets the AMA guidelines for Morbid (severe) obesity with a BMI > 40.0 and I have recommended weight loss.  Follow-Up Instructions: Return if symptoms worsen or fail to improve.   Orders:  No orders of the defined types were placed in this encounter.  No orders of the defined types were placed in this encounter.     Procedures: No procedures performed   Clinical Data: No additional findings.   Subjective: Chief Complaint  Patient presents with  . Right Knee - Follow-up    [name] is following up today for chronic right knee pain and recent MRI.  He states the pain is at best 3 out of 10 at worst 10 out of 10.  He is able to walk for daily activities.  Denies any mechanical symptoms.  Mainly a constant dull ache in the right knee.  He has upcoming throat surgery and biopsy with Dr.Shoemaker to  look at a cyst.   Review of Systems  Constitutional: Negative.   All other systems reviewed and are negative.    Objective: Vital Signs: There were no vitals taken for this visit.  Physical Exam Vitals and nursing note reviewed.  Constitutional:      Appearance: He is well-developed.  Pulmonary:     Effort: Pulmonary effort is normal.  Abdominal:     Palpations: Abdomen is soft.  Skin:    General: Skin is warm.  Neurological:     Mental Status: He is alert and oriented to person, place, and time.  Psychiatric:        Behavior: Behavior normal.        Thought Content: Thought content normal.        Judgment: Judgment normal.     Ortho Exam Right knee shows a small joint effusion.  Moderate pain with range of motion.  Mild crepitus with range of motion.  Collaterals and cruciates are stable.  No significant joint line tenderness. Specialty Comments:  No specialty comments available.  Imaging: No results found.   PMFS History: Patient Active Problem List   Diagnosis Date Noted  . Chronic pain of right knee 10/17/2018  . Large cell (diffuse) non-Hodgkin's lymphoma ([place]) 05/10/2018  . Bacteremia due to Escherichia  coli   . HTN (hypertension) 04/17/2018  . RLS (restless legs syndrome) 04/17/2018  . Abnormal LFTs 04/17/2018  . Acute metabolic encephalopathy 97/06/6376  . AKI (acute kidney injury) ([place]) 04/16/2018  . Dehydration   . Fever, unspecified   . Sepsis ([place])   . Disorientation   . Hypokalemia   . Hypomagnesemia   . Anemia   . Encounter for antineoplastic chemotherapy   . At high risk of tumor lysis syndrome   . Swelling of lower leg   . Diffuse large B cell lymphoma ([place]) 01/15/2018  . 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[place]) 01/12/2018  . Counseling regarding advance care planning and goals of care 01/12/2018  . Bilateral leg pain 05/27/2014   Past Medical History:  Diagnosis Date  . Allergy   . Anemia    during  chemo  . Arthritis    knee   . Blood transfusion without reported diagnosis   . Cancer ([place])    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- last radiation 06-2018  . Cataract    removed both eyes with l;ens implants   . Family history of colon cancer    in his brother- dx'd age 65   . History of chemotherapy    last 06-01-2018  . History of kidney stones   . History of radiation therapy    last radiation 06-2018  . Hyperlipidemia    currently under control  . Hypertension   . Irregular heart beats   . Lymphadenopathy   . Pain, lower leg    Bilateral  . Peripheral arterial disease ([place])   . Pre-diabetes   . Red-green color blindness   . RLS (restless legs syndrome)   . Snores   . Wears glasses     Family History  Problem Relation Age of Onset  . Breast cancer Mother   . Diabetes Father   . Hypertension Father   . Stroke Father   . Mental illness Sister   . Hypertension Daughter   . Mental illness Daughter   . Hypertension Brother   . Colon cancer Brother 65       passed away 07-Dec-2018  . Breast cancer Sister   . Esophageal cancer Neg Hx   . Colon polyps Neg Hx   . Rectal cancer Neg Hx   . Stomach cancer Neg Hx     Past Surgical History:  Procedure Laterality Date  . CATARACT EXTRACTION W/ INTRAOCULAR LENS  IMPLANT, BILATERAL    . COLONOSCOPY    . dislodged salava stone    . FRACTURE SURGERY    . HAND ARTHROPLASTY  1995   crushed left hand  . INGUINAL LYMPH NODE BIOPSY Left 01/02/2018   Procedure: LEFT INGUINAL LYMPH NODE BIOPSY;  Surgeon: Rolm Bookbinder, MD;  Location: [facility];  Service: General;  Laterality: Left;  . IR IMAGING GUIDED PORT INSERTION  01/15/2018  . IR REMOVAL TUN ACCESS W/ PORT W/O FL MOD SED  03/11/2019  . MICROLARYNGOSCOPY Left 01/17/2014   Procedure: MICROLARYNGOSCOPY WITH EXCISION OF THE BIOPSY OF LEFT VOCAL CORD LESION;  Surgeon: Izora Gala, MD;  Location: [facility];  Service: ENT;  Laterality: Left;   . ORIF FOOT FRACTURE  2005   left  . REFRACTIVE SURGERY Right    removed cloudiness in right eye after cataract removal    Social History   Occupational History  . Not on file  Tobacco Use  .  Smoking status: Current Every Day Smoker    Packs/day: 0.50    Years: 36.00    Pack years: 18.00    Types: Cigarettes  . Smokeless tobacco: Never Used  . Tobacco comment: he denies smoking in about 2 weeks 07/13/18  Vaping Use  . Vaping Use: Never used  Substance and Sexual Activity  . Alcohol use: Yes    Alcohol/week: 11.0 standard drinks    Types: 5 Shots of liquor, 6 Cans of beer per week    Comment: weekends, he denies current alcohol use 07/13/18  . Drug use: Not Currently    Types: Cocaine    Comment: reports cocaine usage ~2X/ month; last use 12/26/17  . Sexual activity: Not on file

## 2020-08-27 ENCOUNTER — Other Ambulatory Visit: Payer: Self-pay | Admitting: Otolaryngology

## 2020-08-27 ENCOUNTER — Ambulatory Visit
Admission: RE | Admit: 2020-08-27 | Discharge: 2020-08-27 | Disposition: A | Discharge status: Home / Self Care | Payer: 59 | Source: Ambulatory Visit | Attending: Otolaryngology | Admitting: Otolaryngology

## 2020-08-27 ENCOUNTER — Other Ambulatory Visit: Payer: Self-pay

## 2020-08-27 DIAGNOSIS — R490 Dysphonia: Secondary | ICD-10-CM

## 2020-08-27 IMAGING — CT CT NECK W/ CM
5 of 6 series · 14 of 35 positions shown, 16 images · IV contrast (iopamidol)
Comparison: None.

CLINICAL DATA: Persistent hoarseness. History of cord stripping.
History of squamous cell carcinoma by by visual inspection.

EXAM:
CT NECK WITH CONTRAST
TECHNIQUE: Multidetector CT imaging of the neck was performed using the
standard protocol following the bolus administration of intravenous
contrast.
CONTRAST:  75mL [U1] IOPAMIDOL ([U1]) INJECTION 61%

[Series 2: neck 2.00 br40 s3 st/ no angle · axial · 0.52mm/px · z∈[-776,-692]mm · 2 of 128 slices shown, 3 images]
[im 43/128  soft-tissue]
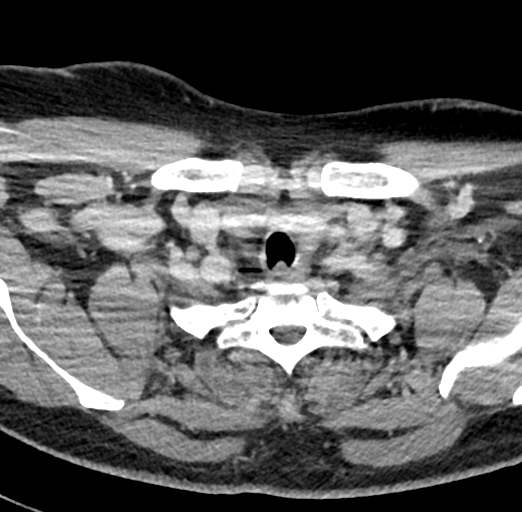
[im 43/128  bone]
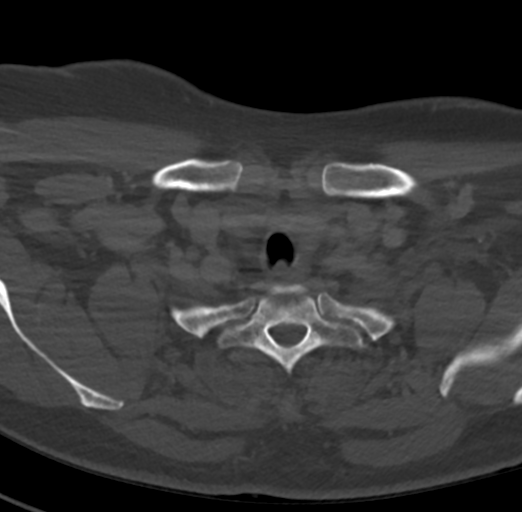
[im 85/128  bone]
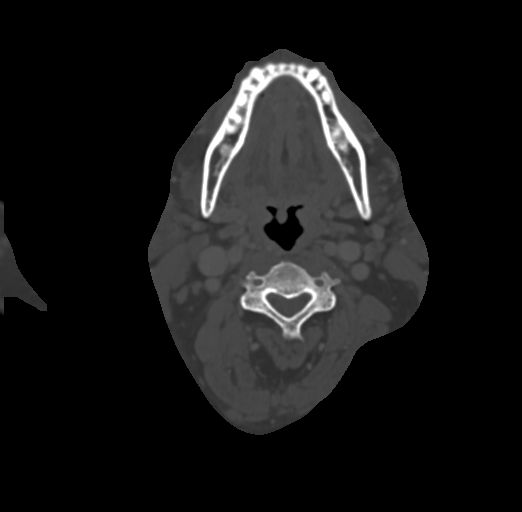

[Series 4: neck 2.00 br60 s3 bone/ no angle · axial · 0.52mm/px · z∈[-776,-692]mm · 2 of 128 slices shown]
[im 43/128  bone]
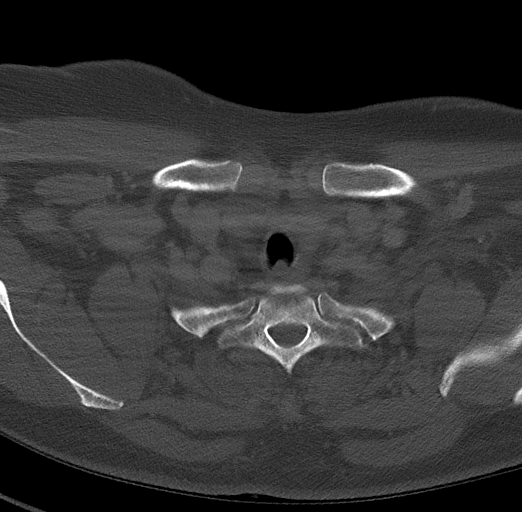
[im 85/128  bone]
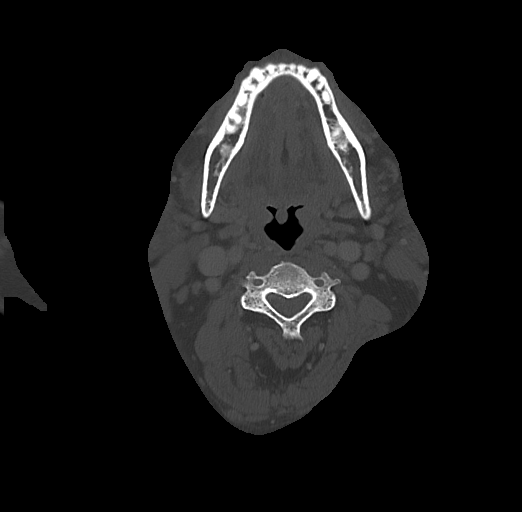

[Series 6: neck 2.00 br36 s3 angled axial (person_name) · axial · 0.52mm/px · z∈[-803,-721]mm · 2 of 128 slices shown]
[im 43/128  bone]
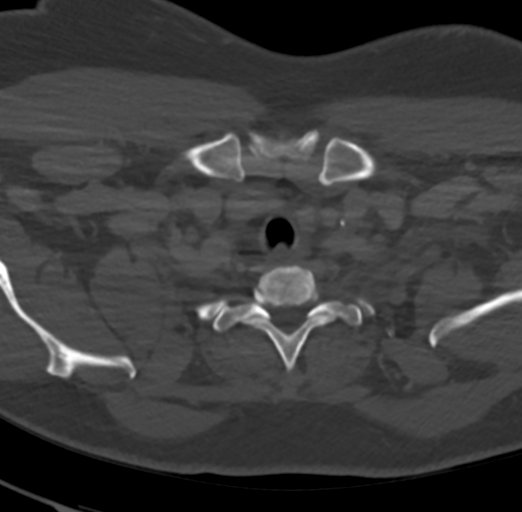
[im 85/128  bone]
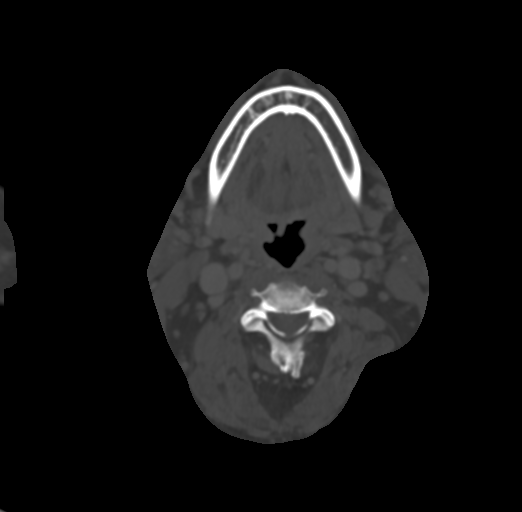

[Series 10: neck 2.00 br40 s3 (person_name) · coronal · 0.50mm/px · 3 of 132 slices shown (1 of 2)]
[im 31/132  bone]
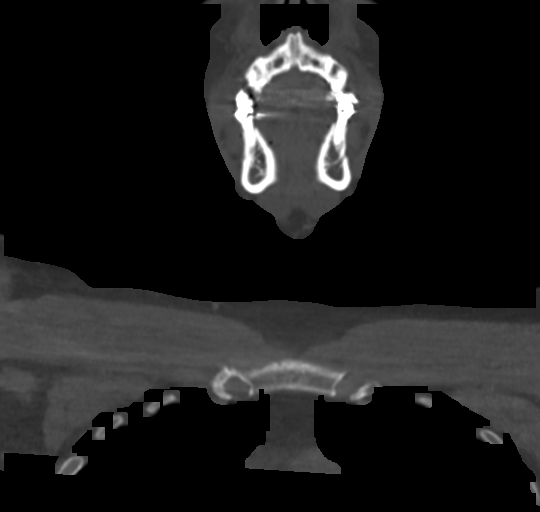
[im 54/132  bone]
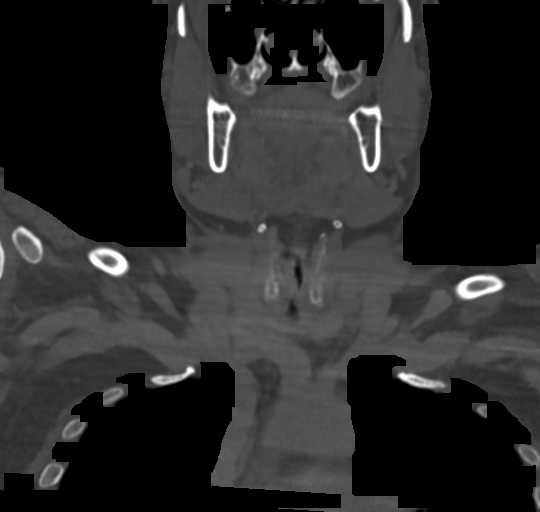
[im 78/132  bone]
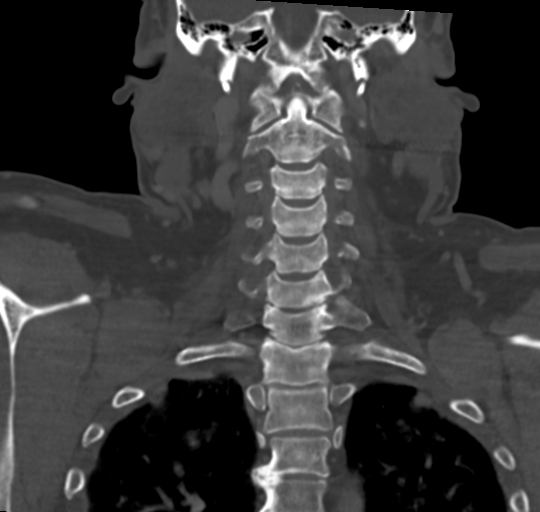

[Series 12: neck 2.00 br40 s3 (person_name) · sagittal · 0.50mm/px · 5 of 133 slices shown, 6 images (2 of 2)]
[im 45/133  bone]
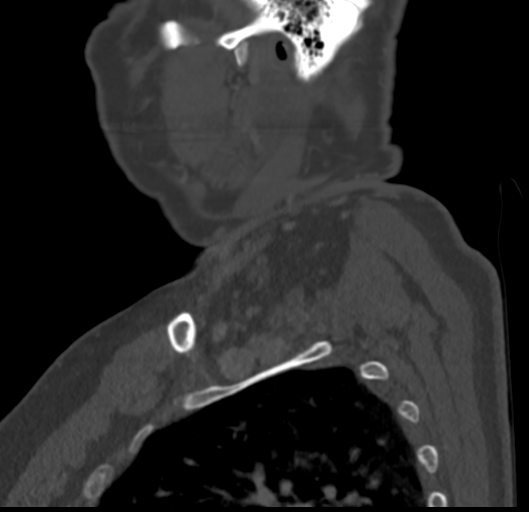
[im 56/133  bone]
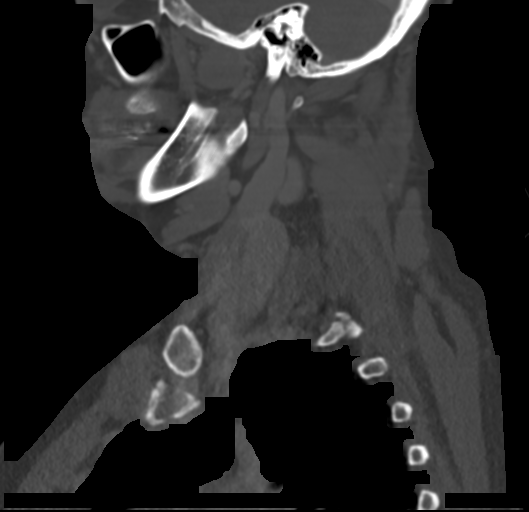
[im 67/133  soft-tissue]
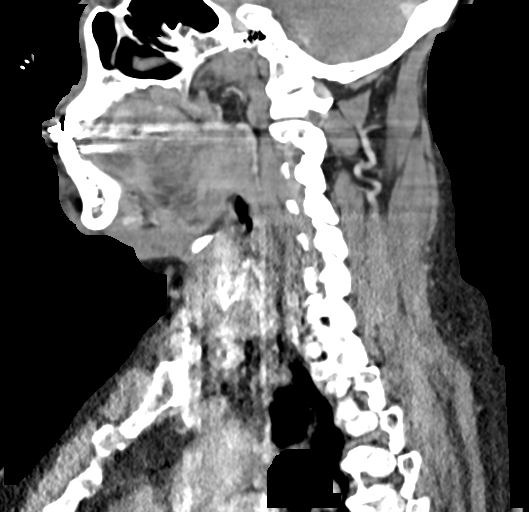
[im 67/133  bone]
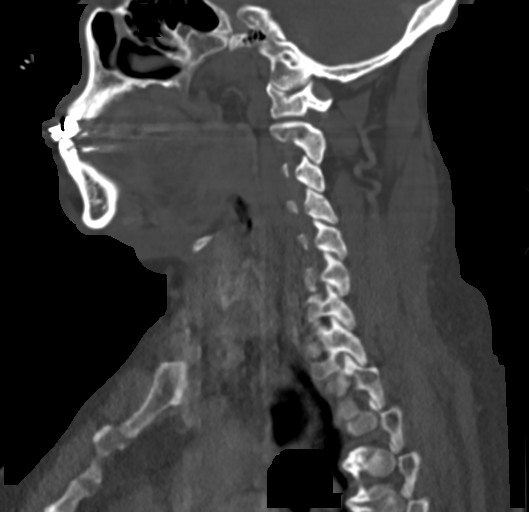
[im 78/133  bone]
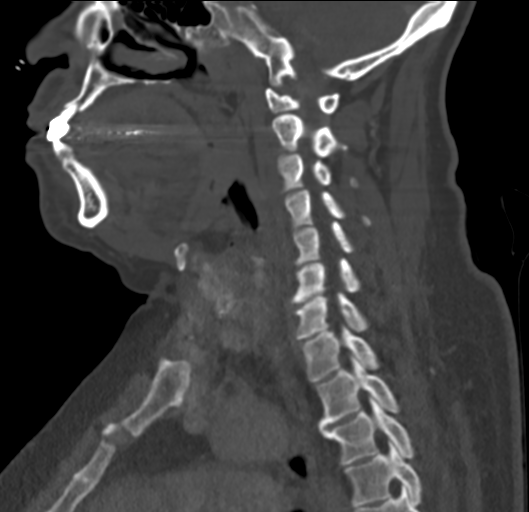
[im 89/133  bone]
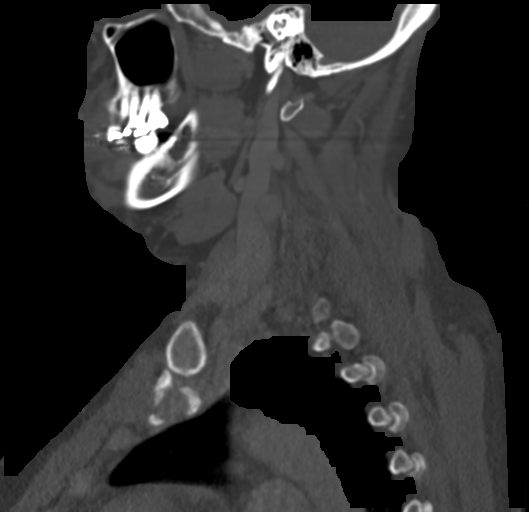

[14 of 35 positions shown; findings below may reference images not displayed]

FINDINGS: Pharynx and larynx: Relative mounding of the right vocal fold which
is subtle but presumably relates to the history. When allowing for
streak and motion artifact there is no detected cartilage erosion or
paraglottic fat invasion. Small bilateral laryngoceles.

Salivary glands: No inflammation, mass, or stone.

Thyroid: Normal.

Lymph nodes: None enlarged or abnormal density.

Vascular: Negative.

Limited intracranial: Negative.

Visualized orbits: Negative.

Mastoids and visualized paranasal sinuses: Clear.

Skeleton: No acute or aggressive process.

Upper chest: Negative.
IMPRESSION: Subtle thickening at the right glottis which presumably relates to
the laryngoscopy findings. No submucosal tumor when accounting for
streak artifact. No adenopathy.

## 2020-08-27 MED ORDER — IOPAMIDOL (ISOVUE-300) INJECTION 61%
75.0000 mL | Freq: Once | INTRAVENOUS | Status: AC | PRN
  Administered 2020-08-27: 75 mL via INTRAVENOUS

## 2020-09-14 ENCOUNTER — Other Ambulatory Visit (HOSPITAL_COMMUNITY)
Admission: RE | Admit: 2020-09-14 | Discharge: 2020-09-14 | Disposition: A | Discharge status: Home / Self Care | Payer: Medicare Other | Source: Ambulatory Visit | Attending: Otolaryngology | Admitting: Otolaryngology

## 2020-09-14 DIAGNOSIS — Z20822 Contact with and (suspected) exposure to covid-19: Secondary | ICD-10-CM | POA: Insufficient documentation

## 2020-09-14 DIAGNOSIS — Z01812 Encounter for preprocedural laboratory examination: Secondary | ICD-10-CM | POA: Insufficient documentation

## 2020-09-14 LAB — SARS CORONAVIRUS 2 (TAT 6-24 HRS): SARS Coronavirus 2: NEGATIVE

## 2020-09-15 ENCOUNTER — Encounter (HOSPITAL_COMMUNITY): Payer: Self-pay | Admitting: Otolaryngology

## 2020-09-15 NOTE — Progress Notes (Signed)
Spoke with pt for pre-op call. Pt denies cardiac history except for having skipped beats. States he saw a cardiologist "years ago" but otherwise has not had any other problems. He states he does not feel the skip beats but has shown up on EKG's.  Pt states that during cancer treatment his A1C was elevated, he denies being pre-diabetic, just elevated blood sugar due to the cancer treatment. Last A1C was 6.0 on 08/21/19.   Covid test done 09/14/20 and it's negative. He states he's been in quarantine except for going to the dentist today. He understands he needs to stay in quarantine until he comes to the hospital tomorrow.

## 2020-09-16 ENCOUNTER — Encounter (HOSPITAL_COMMUNITY)
Admission: RE | Disposition: A | Discharge status: Home / Self Care | Payer: Self-pay | Source: Home / Self Care | Attending: Otolaryngology

## 2020-09-16 ENCOUNTER — Ambulatory Visit (HOSPITAL_COMMUNITY): Payer: Medicare Other | Admitting: Certified Registered Nurse Anesthetist

## 2020-09-16 ENCOUNTER — Other Ambulatory Visit: Payer: Self-pay

## 2020-09-16 ENCOUNTER — Encounter (HOSPITAL_COMMUNITY): Payer: Self-pay | Admitting: Otolaryngology

## 2020-09-16 ENCOUNTER — Ambulatory Visit (HOSPITAL_COMMUNITY)
Admission: RE | Admit: 2020-09-16 | Discharge: 2020-09-16 | Disposition: A | Discharge status: Home / Self Care | Payer: Medicare Other | Attending: Otolaryngology | Admitting: Otolaryngology

## 2020-09-16 DIAGNOSIS — J383 Other diseases of vocal cords: Secondary | ICD-10-CM

## 2020-09-16 DIAGNOSIS — Z923 Personal history of irradiation: Secondary | ICD-10-CM | POA: Insufficient documentation

## 2020-09-16 DIAGNOSIS — Z803 Family history of malignant neoplasm of breast: Secondary | ICD-10-CM | POA: Insufficient documentation

## 2020-09-16 DIAGNOSIS — Z8572 Personal history of non-Hodgkin lymphomas: Secondary | ICD-10-CM | POA: Insufficient documentation

## 2020-09-16 DIAGNOSIS — R490 Dysphonia: Secondary | ICD-10-CM | POA: Insufficient documentation

## 2020-09-16 DIAGNOSIS — I10 Essential (primary) hypertension: Secondary | ICD-10-CM | POA: Insufficient documentation

## 2020-09-16 DIAGNOSIS — Z833 Family history of diabetes mellitus: Secondary | ICD-10-CM | POA: Insufficient documentation

## 2020-09-16 DIAGNOSIS — R7303 Prediabetes: Secondary | ICD-10-CM | POA: Diagnosis not present

## 2020-09-16 DIAGNOSIS — Z91030 Bee allergy status: Secondary | ICD-10-CM | POA: Diagnosis not present

## 2020-09-16 DIAGNOSIS — Z800 Family history of malignant neoplasm of digestive organs: Secondary | ICD-10-CM | POA: Insufficient documentation

## 2020-09-16 DIAGNOSIS — Z7982 Long term (current) use of aspirin: Secondary | ICD-10-CM | POA: Insufficient documentation

## 2020-09-16 DIAGNOSIS — Z79899 Other long term (current) drug therapy: Secondary | ICD-10-CM | POA: Diagnosis not present

## 2020-09-16 DIAGNOSIS — Z823 Family history of stroke: Secondary | ICD-10-CM | POA: Diagnosis not present

## 2020-09-16 DIAGNOSIS — Z818 Family history of other mental and behavioral disorders: Secondary | ICD-10-CM | POA: Insufficient documentation

## 2020-09-16 DIAGNOSIS — Z9221 Personal history of antineoplastic chemotherapy: Secondary | ICD-10-CM | POA: Insufficient documentation

## 2020-09-16 DIAGNOSIS — Z8249 Family history of ischemic heart disease and other diseases of the circulatory system: Secondary | ICD-10-CM | POA: Insufficient documentation

## 2020-09-16 DIAGNOSIS — C320 Malignant neoplasm of glottis: Secondary | ICD-10-CM | POA: Insufficient documentation

## 2020-09-16 DIAGNOSIS — F17210 Nicotine dependence, cigarettes, uncomplicated: Secondary | ICD-10-CM | POA: Insufficient documentation

## 2020-09-16 HISTORY — PX: MICROLARYNGOSCOPY: SHX5208

## 2020-09-16 LAB — COMPREHENSIVE METABOLIC PANEL
ALT: 15 U/L (ref 0–44)
AST: 20 U/L (ref 15–41)
Albumin: 3.9 g/dL (ref 3.5–5.0)
Alkaline Phosphatase: 48 U/L (ref 38–126)
Anion gap: 8 (ref 5–15)
BUN: 9 mg/dL (ref 8–23)
CO2: 25 mmol/L (ref 22–32)
Calcium: 9.2 mg/dL (ref 8.9–10.3)
Chloride: 105 mmol/L (ref 98–111)
Creatinine, Ser: 0.95 mg/dL (ref 0.61–1.24)
GFR, Estimated: >60 mL/min (ref >60)
Glucose, Bld: 146 mg/dL — ABNORMAL HIGH (ref 70–99)
Potassium: 3.7 mmol/L (ref 3.5–5.1)
Sodium: 138 mmol/L (ref 135–145)
Total Bilirubin: 0.5 mg/dL (ref 0.3–1.2)
Total Protein: 6.6 g/dL (ref 6.5–8.1)

## 2020-09-16 LAB — GLUCOSE, CAPILLARY: Glucose-Capillary: 162 mg/dL — ABNORMAL HIGH (ref 70–99)

## 2020-09-16 SURGERY — MICROLARYNGOSCOPY
Anesthesia: General

## 2020-09-16 MED ORDER — MIDAZOLAM HCL 5 MG/5ML IJ SOLN
INTRAMUSCULAR | Status: DC | PRN
  Administered 2020-09-16: 2 mg via INTRAVENOUS

## 2020-09-16 MED ORDER — ONDANSETRON HCL 4 MG/2ML IJ SOLN
INTRAMUSCULAR | Status: AC
  Filled 2020-09-16: qty 4

## 2020-09-16 MED ORDER — OXYCODONE HCL 5 MG/5ML PO SOLN: 5.0000 mg | Freq: Once | ORAL | Status: DC | PRN

## 2020-09-16 MED ORDER — DEXAMETHASONE SODIUM PHOSPHATE 10 MG/ML IJ SOLN
INTRAMUSCULAR | Status: AC
  Filled 2020-09-16: qty 1

## 2020-09-16 MED ORDER — ONDANSETRON HCL 4 MG/2ML IJ SOLN
INTRAMUSCULAR | Status: AC
  Filled 2020-09-16: qty 2

## 2020-09-16 MED ORDER — LACTATED RINGERS IV SOLN: INTRAVENOUS | Status: DC

## 2020-09-16 MED ORDER — OXYCODONE HCL 5 MG PO TABS: 5.0000 mg | ORAL_TABLET | Freq: Once | ORAL | Status: DC | PRN

## 2020-09-16 MED ORDER — PROMETHAZINE HCL 25 MG/ML IJ SOLN: 6.2500 mg | INTRAMUSCULAR | Status: DC | PRN

## 2020-09-16 MED ORDER — DEXAMETHASONE SODIUM PHOSPHATE 10 MG/ML IJ SOLN
INTRAMUSCULAR | Status: DC | PRN
  Administered 2020-09-16: 5 mg via INTRAVENOUS

## 2020-09-16 MED ORDER — LIDOCAINE 2% (20 MG/ML) 5 ML SYRINGE
INTRAMUSCULAR | Status: DC | PRN
  Administered 2020-09-16: 60 mg via INTRAVENOUS

## 2020-09-16 MED ORDER — PROPOFOL 10 MG/ML IV BOLUS
INTRAVENOUS | Status: DC | PRN
  Administered 2020-09-16: 200 mg via INTRAVENOUS

## 2020-09-16 MED ORDER — LIDOCAINE 2% (20 MG/ML) 5 ML SYRINGE
INTRAMUSCULAR | Status: AC
  Filled 2020-09-16: qty 10

## 2020-09-16 MED ORDER — FENTANYL CITRATE (PF) 100 MCG/2ML IJ SOLN: 25.0000 ug | INTRAMUSCULAR | Status: DC | PRN

## 2020-09-16 MED ORDER — LACTATED RINGERS IV SOLN: INTRAVENOUS | Status: DC | PRN

## 2020-09-16 MED ORDER — AMISULPRIDE (ANTIEMETIC) 5 MG/2ML IV SOLN: 10.0000 mg | Freq: Once | INTRAVENOUS | Status: DC | PRN

## 2020-09-16 MED ORDER — ONDANSETRON HCL 4 MG/2ML IJ SOLN
INTRAMUSCULAR | Status: DC | PRN
  Administered 2020-09-16: 4 mg via INTRAVENOUS

## 2020-09-16 MED ORDER — CHLORHEXIDINE GLUCONATE 0.12 % MT SOLN
15.0000 mL | Freq: Once | OROMUCOSAL | Status: AC
  Administered 2020-09-16: 15 mL via OROMUCOSAL
  Filled 2020-09-16: qty 15

## 2020-09-16 MED ORDER — EPINEPHRINE HCL (NASAL) 0.1 % NA SOLN
NASAL | Status: AC
  Filled 2020-09-16: qty 30

## 2020-09-16 MED ORDER — CEFAZOLIN IN SODIUM CHLORIDE 3-0.9 GM/100ML-% IV SOLN
3.0000 g | INTRAVENOUS | Status: AC
  Administered 2020-09-16: 3 g via INTRAVENOUS
  Filled 2020-09-16: qty 100

## 2020-09-16 MED ORDER — KETOROLAC TROMETHAMINE 30 MG/ML IJ SOLN: 30.0000 mg | Freq: Once | INTRAMUSCULAR | Status: DC

## 2020-09-16 MED ORDER — FENTANYL CITRATE (PF) 250 MCG/5ML IJ SOLN
INTRAMUSCULAR | Status: AC
  Filled 2020-09-16: qty 5

## 2020-09-16 MED ORDER — SUCCINYLCHOLINE CHLORIDE 200 MG/10ML IV SOSY
PREFILLED_SYRINGE | INTRAVENOUS | Status: DC | PRN
  Administered 2020-09-16: 140 mg via INTRAVENOUS

## 2020-09-16 MED ORDER — LIDOCAINE 2% (20 MG/ML) 5 ML SYRINGE
INTRAMUSCULAR | Status: AC
  Filled 2020-09-16: qty 5

## 2020-09-16 MED ORDER — PHENYLEPHRINE 40 MCG/ML (10ML) SYRINGE FOR IV PUSH (FOR BLOOD PRESSURE SUPPORT)
PREFILLED_SYRINGE | INTRAVENOUS | Status: AC
  Filled 2020-09-16: qty 10

## 2020-09-16 MED ORDER — SUCCINYLCHOLINE CHLORIDE 200 MG/10ML IV SOSY
PREFILLED_SYRINGE | INTRAVENOUS | Status: AC
  Filled 2020-09-16: qty 10

## 2020-09-16 MED ORDER — EPHEDRINE 5 MG/ML INJ
INTRAVENOUS | Status: AC
  Filled 2020-09-16: qty 10

## 2020-09-16 MED ORDER — FENTANYL CITRATE (PF) 250 MCG/5ML IJ SOLN
INTRAMUSCULAR | Status: DC | PRN
  Administered 2020-09-16: 100 ug via INTRAVENOUS

## 2020-09-16 MED ORDER — ROCURONIUM BROMIDE 10 MG/ML (PF) SYRINGE
PREFILLED_SYRINGE | INTRAVENOUS | Status: AC
  Filled 2020-09-16: qty 10

## 2020-09-16 MED ORDER — PROPOFOL 10 MG/ML IV BOLUS
INTRAVENOUS | Status: AC
  Filled 2020-09-16: qty 20

## 2020-09-16 MED ORDER — ORAL CARE MOUTH RINSE
15.0000 mL | Freq: Once | OROMUCOSAL | Status: AC
Start: 2020-09-16 — End: 2020-09-16

## 2020-09-16 MED ORDER — MIDAZOLAM HCL 2 MG/2ML IJ SOLN
INTRAMUSCULAR | Status: AC
  Filled 2020-09-16: qty 2

## 2020-09-16 MED ORDER — DEXAMETHASONE SODIUM PHOSPHATE 10 MG/ML IJ SOLN
INTRAMUSCULAR | Status: AC
  Filled 2020-09-16: qty 2

## 2020-09-16 MED ORDER — 0.9 % SODIUM CHLORIDE (POUR BTL) OPTIME
TOPICAL | Status: DC | PRN
  Administered 2020-09-16: 1000 mL

## 2020-09-16 MED ORDER — ACETAMINOPHEN 10 MG/ML IV SOLN: 1000.0000 mg | Freq: Once | INTRAVENOUS | Status: DC | PRN

## 2020-09-16 SURGICAL SUPPLY — 22 items
CANISTER SUCT 3000ML PPV (MISCELLANEOUS) ×2 IMPLANT
CNTNR URN SCR LID CUP LEK RST (MISCELLANEOUS) ×1 IMPLANT
CONT SPEC 4OZ STRL OR WHT (MISCELLANEOUS) ×2
COVER BACK TABLE 60X90IN (DRAPES) ×2 IMPLANT
COVER MAYO STAND STRL (DRAPES) ×2 IMPLANT
COVER WAND RF STERILE (DRAPES) ×2 IMPLANT
DRAPE HALF SHEET 40X57 (DRAPES) ×2 IMPLANT
DRSG TELFA 3X8 NADH (GAUZE/BANDAGES/DRESSINGS) ×2 IMPLANT
GAUZE SPONGE 4X4 12PLY STRL (GAUZE/BANDAGES/DRESSINGS) IMPLANT
GLOVE BIOGEL M 7.0 STRL (GLOVE) ×2 IMPLANT
GUARD TEETH (MISCELLANEOUS) ×2 IMPLANT
KIT BASIN OR (CUSTOM PROCEDURE TRAY) ×2 IMPLANT
KIT TURNOVER KIT B (KITS) ×2 IMPLANT
NEEDLE 18GX1X1/2 (RX/OR ONLY) (NEEDLE) ×2 IMPLANT
NS IRRIG 1000ML POUR BTL (IV SOLUTION) ×2 IMPLANT
PAD ARMBOARD 7.5X6 YLW CONV (MISCELLANEOUS) ×2 IMPLANT
PATTIES SURGICAL .5 X1 (DISPOSABLE) ×2 IMPLANT
SOL ANTI FOG 6CC (MISCELLANEOUS) ×1 IMPLANT
SOLUTION ANTI FOG 6CC (MISCELLANEOUS) ×1
SURGILUBE 2OZ TUBE FLIPTOP (MISCELLANEOUS) IMPLANT
TOWEL GREEN STERILE FF (TOWEL DISPOSABLE) ×2 IMPLANT
TUBE CONNECTING 12X1/4 (SUCTIONS) ×2 IMPLANT

## 2020-09-16 NOTE — Anesthesia Postprocedure Evaluation (Signed)
Anesthesia Post Note  Patient: Jesus [name]  Procedure(s) Performed: MICROLARYNGOSCOPY with Biopsy of vocal cord lesion (N/A )     Patient location during evaluation: PACU Anesthesia Type: General Level of consciousness: awake Pain management: pain level controlled Vital Signs Assessment: post-procedure vital signs reviewed and stable Respiratory status: spontaneous breathing, nonlabored ventilation, respiratory function stable and patient connected to nasal cannula oxygen Cardiovascular status: blood pressure returned to baseline and stable Postop Assessment: no apparent nausea or vomiting Anesthetic complications: no   No complications documented.  Last Vitals:  Vitals:   09/16/20 1224 09/16/20 1234  BP: (!) 153/80   Pulse: 85   Resp: 19   Temp:  36.6 C  SpO2: 92%     Last Pain:  Vitals:   09/16/20 1224  TempSrc:   PainSc: 0-No pain                 Nelissa Bolduc P Esiah Bazinet

## 2020-09-16 NOTE — Anesthesia Procedure Notes (Signed)
Procedure Name: Intubation Date/Time: 09/16/2020 11:37 AM Performed by: Glynda Jaeger, CRNA Pre-anesthesia Checklist: Patient identified, Patient being monitored, Timeout performed, Emergency Drugs available and Suction available Patient Re-evaluated:Patient Re-evaluated prior to induction Oxygen Delivery Method: Circle System Utilized Preoxygenation: Pre-oxygenation with 100% oxygen Induction Type: IV induction Ventilation: Mask ventilation without difficulty Laryngoscope Size: Mac and 4 Grade View: Grade I Tube type: Oral Tube size: 7.0 mm Number of attempts: 1 Airway Equipment and Method: Stylet Placement Confirmation: ETT inserted through vocal cords under direct vision,  positive ETCO2 and breath sounds checked- equal and bilateral Secured at: 23 cm Tube secured with: Tape Dental Injury: Teeth and Oropharynx as per pre-operative assessment

## 2020-09-16 NOTE — Op Note (Signed)
Operative Note: DIRECT LARYNGOSCOPY/BIOPSY  Patient: Jesus [name] record number: [id number]  Date:09/16/2020  Pre-operative Indications: 1.  Chronic hoarseness     2.  Right vocal cord lesion  Postoperative Indications: Same  Surgical Procedure:  Direct Laryngoscopy     Biopsy right vocal cord lesion  Anesthesia: GET  Surgeon: Delsa Bern, M.D.  Assist: None  Complications: None  EBL: None   Brief History: The patient is a 65 y.o. male with a history of chronic hoarseness.  The patient undergone previous vocal cord biopsy and stripping by Dr. Constance Holster more than 5 years ago, pathology benign at that time.  The patient is a chronic smoker.  He was referred to our office for evaluation of chronic hoarseness and office-based flexible laryngoscopy showed an exophytic mass involving the mid aspect of the right vocal cord. Given the patient's history and findings I recommended direct laryngoscopy with biopsy under general anesthesia, risks and benefits were discussed in detail with the patient and her family. They understand and agree with our plan for surgery which is scheduled at [facility] on an elective outpatient basis.  Surgical Procedure: The patient is brought to the operating room on 09/16/2020 and placed in supine position on the operating table. General endotracheal anesthesia was established without difficulty. When the patient was adequately anesthetized, surgical timeout was performed and correct identification of the patient and the surgical procedure. The patient was positioned and prepped and draped in sterile fashion.  A laryngoscope was used to examine the patient's oral cavity, oropharynx and larynx.  Findings include no ulcer mass or lesion in the upper airway.  Laryngoscope was inserted and direct visualization of the patient's larynx was undertaken.  He has an exophytic mass involving the mid aspect of the right vocal cord.  Multiple soft tissue biopsies  were then taken and sent to pathology for gross and microscopic evaluation.  Topical lidocaine anesthesia was applied to the patient's airway.  There was no significant bleeding and the patient's airway was stable.  An orogastric tube was passed and stomach contents were aspirated. Patient was awakened from anesthetic and transferred from the operating room to the recovery room in stable condition. There were no complications and blood loss was minimal.   Delsa Bern, M.D. [facility] 09/16/2020

## 2020-09-16 NOTE — Anesthesia Preprocedure Evaluation (Addendum)
Anesthesia Evaluation  Patient identified by MRN, date of birth, ID band Patient awake    Reviewed: Allergy & Precautions, NPO status , Patient's Chart, lab work & pertinent test results  Airway Mallampati: III  TM Distance: >3 FB Neck ROM: Full    Dental no notable dental hx.    Pulmonary Current Smoker and Patient abstained from smoking.,    Pulmonary exam normal breath sounds clear to auscultation       Cardiovascular hypertension, Pt. on medications + Peripheral Vascular Disease  Normal cardiovascular exam Rhythm:Regular Rate:Normal  ECG: rate 66. Normal sinus rhythm Right bundle branch block Left anterior fascicular block Bifascicular block    Neuro/Psych negative neurological ROS  negative psych ROS   GI/Hepatic negative GI ROS, (+)     substance abuse  ,   Endo/Other  Morbid obesityNon- Hodgkins lymphoma   Renal/GU negative Renal ROS     Musculoskeletal  (+) Arthritis ,   Abdominal (+) + obese,   Peds  Hematology negative hematology ROS (+)   Anesthesia Other Findings Hoarsness  Reproductive/Obstetrics                           Anesthesia Physical Anesthesia Plan  ASA: III  Anesthesia Plan: General   Post-op Pain Management:    Induction: Intravenous  PONV Risk Score and Plan: 2 and Ondansetron, Dexamethasone, Midazolam and Treatment may vary due to age or medical condition  Airway Management Planned: Oral ETT  Additional Equipment:   Intra-op Plan:   Post-operative Plan: Extubation in OR  Informed Consent: I have reviewed the patients History and Physical, chart, labs and discussed the procedure including the risks, benefits and alternatives for the proposed anesthesia with the patient or authorized representative who has indicated his/her understanding and acceptance.     Dental advisory given  Plan Discussed with: CRNA  Anesthesia Plan Comments:         Anesthesia Quick Evaluation

## 2020-09-16 NOTE — Transfer of Care (Signed)
Immediate Anesthesia Transfer of Care Note  Patient: Jesus [name]  Procedure(s) Performed: MICROLARYNGOSCOPY with Biopsy of vocal cord lesion (N/A )  Patient Location: PACU  Anesthesia Type:General  Level of Consciousness: awake, alert , oriented, patient cooperative and responds to stimulation  Airway & Oxygen Therapy: Patient Spontanous Breathing and Patient connected to face mask oxygen  Post-op Assessment: Report given to RN, Post -op Vital signs reviewed and stable and Patient moving all extremities X 4  Post vital signs: Reviewed and stable  Last Vitals:  Vitals Value Taken Time  BP    Temp    Pulse    Resp    SpO2      Last Pain:  Vitals:   09/16/20 0959  TempSrc:   PainSc: 8          Complications: No complications documented.

## 2020-09-16 NOTE — H&P (Signed)
Jesus [name] is an 65 y.o. male.   Chief Complaint: Chronic hoarseness HPI: Patient with a history of chronic hoarseness who underwent previous vocal cord stripping for benign disease has developed worsening hoarseness.  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  Past Medical History:  Diagnosis Date  . Allergy   . Anemia    during chemo  . Arthritis    knee   . Blood transfusion without reported diagnosis   . Cancer ([place])    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- last radiation 06-2018  . Cataract    removed both eyes with l;ens implants   . Family history of colon cancer    in his brother- dx'd age 28   . History of chemotherapy    last 06-01-2018  . History of kidney stones   . History of radiation therapy    last radiation 06-2018  . Hyperlipidemia    currently under control  . Hypertension   . Irregular heart beats   . Lymphadenopathy   . Pain, lower leg    Bilateral  . Peripheral arterial disease ([place])   . Pre-diabetes   . Red-green color blindness   . RLS (restless legs syndrome)   . Snores   . Wears glasses     Past Surgical History:  Procedure Laterality Date  . CATARACT EXTRACTION W/ INTRAOCULAR LENS  IMPLANT, BILATERAL    . COLONOSCOPY    . dislodged salava stone    . FRACTURE SURGERY    . HAND ARTHROPLASTY  1995   crushed left hand  . INGUINAL LYMPH NODE BIOPSY Left 01/02/2018   Procedure: LEFT INGUINAL LYMPH NODE BIOPSY;  Surgeon: Rolm Bookbinder, MD;  Location: [facility];  Service: General;  Laterality: Left;  . IR IMAGING GUIDED PORT INSERTION  01/15/2018  . IR REMOVAL TUN ACCESS W/ PORT W/O FL MOD SED  03/11/2019  . MICROLARYNGOSCOPY Left 01/17/2014   Procedure: MICROLARYNGOSCOPY WITH EXCISION OF THE BIOPSY OF LEFT VOCAL CORD LESION;  Surgeon: Izora Gala, MD;  Location: [facility];  Service: ENT;  Laterality: Left;  . ORIF FOOT FRACTURE  2005   left  .  REFRACTIVE SURGERY Right    removed cloudiness in right eye after cataract removal     Family History  Problem Relation Age of Onset  . Breast cancer Mother   . Diabetes Father   . Hypertension Father   . Stroke Father   . Mental illness Sister   . Hypertension Daughter   . Mental illness Daughter   . Hypertension Brother   . Colon cancer Brother 37       passed away 12-19-18  . Breast cancer Sister   . Esophageal cancer Neg Hx   . Colon polyps Neg Hx   . Rectal cancer Neg Hx   . Stomach cancer Neg Hx    Social History:  reports that he has been smoking cigarettes. He has a 18.00 pack-year smoking history. He has never used smokeless tobacco. He reports current alcohol use of about 11.0 standard drinks of alcohol per week. He reports current drug use. Drugs:  and Marijuana.  Allergies:  Allergies  Allergen Reactions  . Bee Venom Anaphylaxis    Medications Prior to Admission  Medication Sig Dispense Refill  . amLODipine (NORVASC) 10 MG tablet TAKE 1 TABLET BY MOUTH EVERY DAY (Patient taking differently: Take 10 mg by mouth every evening.) 30 tablet  0  . aspirin EC 81 MG tablet Take 81 mg by mouth every evening. Swallow whole.    Marland Kitchen HYDROcodone-acetaminophen (NORCO) 5-325 MG tablet Take 1 tablet by mouth 2 (two) times daily as needed. (Patient taking differently: Take 1 tablet by mouth 2 (two) times daily as needed (severe knee pain.).) 10 tablet 0  . lisinopril (ZESTRIL) 20 MG tablet TAKE 1 TABLET BY MOUTH EVERY DAY (Patient taking differently: Take 20 mg by mouth every evening.) 30 tablet 0  . Multiple Vitamin (MULTIVITAMIN WITH MINERALS) TABS tablet Take 1 tablet by mouth every evening.    . Multiple Vitamins-Minerals (MULTI ADULT GUMMIES PO) Take 1 tablet by mouth in the morning. Centrum    . Nutritional Supplements (FRUIT & VEGETABLE DAILY PO) Take 4 tablets by mouth daily in the afternoon.    . tadalafil (CIALIS) 20 MG tablet Take 20 mg by mouth daily as needed for erectile  dysfunction.    . vitamin B-12 (CYANOCOBALAMIN) 1000 MCG tablet Take 1,000 mcg by mouth every evening.      Results for orders placed or performed during the hospital encounter of 09/16/20 (from the past 48 hour(s))  Glucose, capillary     Status: Abnormal   Collection Time: 09/16/20  9:33 AM  Result Value Ref Range   Glucose-Capillary 162 (H) 70 - 99 mg/dL    Comment: Glucose reference range applies only to samples taken after fasting for at least 8 hours.   Comment 1 Notify RN    No results found.  Review of Systems  Constitutional: Negative.   HENT: Positive for voice change.   Respiratory: Negative.   Cardiovascular: Negative.     Blood pressure (!) 155/95, pulse 77, temperature 97.8 F (36.6 C), temperature source Oral, resp. rate 17, height 6' (1.829 m), weight (!) 142.9 kg, SpO2 97 %. Physical Exam Constitutional:      Appearance: He is normal weight.  HENT:     Mouth/Throat:     Comments: Vocal cord mass Cardiovascular:     Rate and Rhythm: Normal rate.     Pulses: Normal pulses.  Musculoskeletal:     Cervical back: Normal range of motion.  Neurological:     Mental Status: He is alert.      Assessment/Plan Patient admitted for direct laryngoscopy and biopsy of vocal cord mass under general anesthesia as an outpatient.  Jerrell Belfast, MD 09/16/2020, 10:41 AM

## 2020-09-17 ENCOUNTER — Encounter (HOSPITAL_COMMUNITY): Payer: Self-pay | Admitting: Otolaryngology

## 2020-09-17 LAB — SURGICAL PATHOLOGY

## 2020-09-18 ENCOUNTER — Other Ambulatory Visit: Payer: Self-pay

## 2020-09-18 ENCOUNTER — Ambulatory Visit
Admission: EM | Admit: 2020-09-18 | Discharge: 2020-09-18 | Disposition: A | Discharge status: Home / Self Care | Payer: 59 | Attending: Family Medicine | Admitting: Family Medicine

## 2020-09-18 ENCOUNTER — Encounter (HOSPITAL_BASED_OUTPATIENT_CLINIC_OR_DEPARTMENT_OTHER): Payer: Self-pay | Admitting: Emergency Medicine

## 2020-09-18 ENCOUNTER — Emergency Department (HOSPITAL_BASED_OUTPATIENT_CLINIC_OR_DEPARTMENT_OTHER)
Admission: EM | Admit: 2020-09-18 | Discharge: 2020-09-19 | Disposition: A | Discharge status: Home / Self Care | Payer: Medicare Other | Attending: Emergency Medicine | Admitting: Emergency Medicine

## 2020-09-18 DIAGNOSIS — Z96698 Presence of other orthopedic joint implants: Secondary | ICD-10-CM | POA: Diagnosis not present

## 2020-09-18 DIAGNOSIS — R079 Chest pain, unspecified: Secondary | ICD-10-CM

## 2020-09-18 DIAGNOSIS — Z79899 Other long term (current) drug therapy: Secondary | ICD-10-CM | POA: Insufficient documentation

## 2020-09-18 DIAGNOSIS — R072 Precordial pain: Secondary | ICD-10-CM | POA: Diagnosis not present

## 2020-09-18 DIAGNOSIS — F17210 Nicotine dependence, cigarettes, uncomplicated: Secondary | ICD-10-CM | POA: Insufficient documentation

## 2020-09-18 DIAGNOSIS — Z7982 Long term (current) use of aspirin: Secondary | ICD-10-CM | POA: Diagnosis not present

## 2020-09-18 DIAGNOSIS — Z8572 Personal history of non-Hodgkin lymphomas: Secondary | ICD-10-CM | POA: Insufficient documentation

## 2020-09-18 DIAGNOSIS — I10 Essential (primary) hypertension: Secondary | ICD-10-CM | POA: Insufficient documentation

## 2020-09-18 LAB — CBC WITH DIFFERENTIAL/PLATELET
Abs Immature Granulocytes: 0.04 10*3/uL (ref 0.00–0.07)
Basophils Absolute: 0.1 10*3/uL (ref 0.0–0.1)
Basophils Relative: 1 %
Eosinophils Absolute: 0.2 10*3/uL (ref 0.0–0.5)
Eosinophils Relative: 2 %
HCT: 38.2 % — ABNORMAL LOW (ref 39.0–52.0)
Hemoglobin: 13.3 g/dL (ref 13.0–17.0)
Immature Granulocytes: 0 %
Lymphocytes Relative: 42 %
Lymphs Abs: 4.1 10*3/uL — ABNORMAL HIGH (ref 0.7–4.0)
MCH: 31.9 pg (ref 26.0–34.0)
MCHC: 34.8 g/dL (ref 30.0–36.0)
MCV: 91.6 fL (ref 80.0–100.0)
Monocytes Absolute: 0.7 10*3/uL (ref 0.1–1.0)
Monocytes Relative: 7 %
Neutro Abs: 4.8 10*3/uL (ref 1.7–7.7)
Neutrophils Relative %: 48 %
Platelets: 298 10*3/uL (ref 150–400)
RBC: 4.17 MIL/uL — ABNORMAL LOW (ref 4.22–5.81)
RDW: 13.5 % (ref 11.5–15.5)
WBC: 9.9 10*3/uL (ref 4.0–10.5)
nRBC: 0 % (ref 0.0–0.2)

## 2020-09-18 LAB — TROPONIN I (HIGH SENSITIVITY): Troponin I (High Sensitivity): 8 ng/L (ref <18)

## 2020-09-18 LAB — D-DIMER, QUANTITATIVE: D-Dimer, Quant: 0.68 ug/mL-FEU — ABNORMAL HIGH (ref 0.00–0.50)

## 2020-09-18 LAB — COMPREHENSIVE METABOLIC PANEL
ALT: 18 U/L (ref 0–44)
AST: 21 U/L (ref 15–41)
Albumin: 4.2 g/dL (ref 3.5–5.0)
Alkaline Phosphatase: 53 U/L (ref 38–126)
Anion gap: 10 (ref 5–15)
BUN: 20 mg/dL (ref 8–23)
CO2: 25 mmol/L (ref 22–32)
Calcium: 9.5 mg/dL (ref 8.9–10.3)
Chloride: 105 mmol/L (ref 98–111)
Creatinine, Ser: 1.02 mg/dL (ref 0.61–1.24)
GFR, Estimated: >60 mL/min (ref >60)
Glucose, Bld: 105 mg/dL — ABNORMAL HIGH (ref 70–99)
Potassium: 3.8 mmol/L (ref 3.5–5.1)
Sodium: 140 mmol/L (ref 135–145)
Total Bilirubin: 0.3 mg/dL (ref 0.3–1.2)
Total Protein: 7.1 g/dL (ref 6.5–8.1)

## 2020-09-18 NOTE — ED Triage Notes (Signed)
Patient presents to Urgent Care with complaints of chest pain that started this morning. Pt states he has recently learned he has cancer unsure of possible panic attack.   Denies SOB, n/v.

## 2020-09-18 NOTE — ED Triage Notes (Signed)
Patient presents with complaints of chest pain; onset this afternoon; states seen at urgent care pta and sent here for further eval.

## 2020-09-18 NOTE — ED Notes (Signed)
Patient is being discharged from the Urgent Care and sent to the Emergency Department via POV. Per [facility], PA-C, patient is in need of higher level of care due to chest pain. Patient is aware and verbalizes understanding of plan of care.  Vitals:   09/18/20 2004  BP: (!) 144/91  Pulse: 97  Resp: 18  Temp: 98.6 F (37 C)  SpO2: 94%

## 2020-09-18 NOTE — ED Provider Notes (Addendum)
[facility]PARTMENT Provider Note   CSN: [id number] Arrival date & time: 09/18/20  2141     History Chief Complaint  Patient presents with  . Chest Pain    Jesus [name] is a 65 y.o. male.  Patient with history of hypertension, high cholesterol, peripheral arterial disease, history of non-Hodgkin's lymphoma, recent diagnosis of squamous cell carcinoma of the larynx --presents to the emergency department today for evaluation of chest pain.  Patient does not have history of MI or blood clots.  Pain started around 2 or 3 this afternoon.  He describes a dull, nonradiating pain in the left chest.  He did not have associated vomiting or diaphoresis.  It started while he was cooking and he was not doing anything particularly exertional.  Pain has gradually improved over the course of the night and is currently 2-3 out of 10.  He went to urgent care who referred him to the emergency department.        Past Medical History:  Diagnosis Date  . Allergy   . Anemia    during chemo  . Arthritis    knee   . Blood transfusion without reported diagnosis   . Cancer ([place])    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- last radiation 06-2018  . Cataract    removed both eyes with l;ens implants   . Family history of colon cancer    in his brother- dx'd age 53   . History of chemotherapy    last 06-01-2018  . History of kidney stones   . History of radiation therapy    last radiation 06-2018  . Hyperlipidemia    currently under control  . Hypertension   . Irregular heart beats   . Lymphadenopathy   . Pain, lower leg    Bilateral  . Peripheral arterial disease ([place])   . Pre-diabetes   . Red-green color blindness   . RLS (restless legs syndrome)   . Snores   . Wears glasses     Patient Active Problem List   Diagnosis Date Noted  . 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 Chronic pain of right knee 10/17/2018  . Large cell (diffuse)  non-Hodgkin's lymphoma ([place]) 05/10/2018  . Bacteremia due to Escherichia coli   . HTN (hypertension) 04/17/2018  . RLS (restless legs syndrome) 04/17/2018  . Abnormal LFTs 04/17/2018  . Acute metabolic encephalopathy 45/99/7741  . AKI (acute kidney injury) ([place]) 04/16/2018  . Dehydration   . Fever, unspecified   . Sepsis ([place])   . Disorientation   . Hypokalemia   . Hypomagnesemia   . Anemia   . Encounter for antineoplastic chemotherapy   . At high risk of tumor lysis syndrome   . Swelling of lower leg   . Diffuse large B cell lymphoma ([place]) 01/15/2018  . 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[place]) 01/12/2018  . Counseling regarding advance care planning and goals of care 01/12/2018  . Bilateral leg pain 05/27/2014    Past Surgical History:  Procedure Laterality Date  . CATARACT EXTRACTION W/ INTRAOCULAR LENS  IMPLANT, BILATERAL    . COLONOSCOPY    . dislodged salava stone    . FRACTURE SURGERY    . HAND ARTHROPLASTY  1995   crushed left hand  . INGUINAL LYMPH NODE BIOPSY Left 01/02/2018   Procedure: LEFT INGUINAL LYMPH NODE BIOPSY;  Surgeon: Rolm Bookbinder, MD;  Location: [facility];  Service:  General;  Laterality: Left;  . IR IMAGING GUIDED PORT INSERTION  01/15/2018  . IR REMOVAL TUN ACCESS W/ PORT W/O FL MOD SED  03/11/2019  . MICROLARYNGOSCOPY Left 01/17/2014   Procedure: MICROLARYNGOSCOPY WITH EXCISION OF THE BIOPSY OF LEFT VOCAL CORD LESION;  Surgeon: Izora Gala, MD;  Location: [facility];  Service: ENT;  Laterality: Left;  Marland Kitchen MICROLARYNGOSCOPY N/A 09/16/2020   Procedure: MICROLARYNGOSCOPY with Biopsy of vocal cord lesion;  Surgeon: Jerrell Belfast, MD;  Location: [facility];  Service: ENT;  Laterality: N/A;  . ORIF FOOT FRACTURE  2005   left  . REFRACTIVE SURGERY Right    removed cloudiness in right eye after cataract removal        Family History  Problem Relation Age of Onset  . Breast cancer Mother   . Diabetes Father   .  Hypertension Father   . Stroke Father   . Mental illness Sister   . Hypertension Daughter   . Mental illness Daughter   . Hypertension Brother   . Colon cancer Brother 89       passed away 12/14/18  . Breast cancer Sister   . Esophageal cancer Neg Hx   . Colon polyps Neg Hx   . Rectal cancer Neg Hx   . Stomach cancer Neg Hx     Social History   Tobacco Use  . Smoking status: Current Some Day Smoker    Packs/day: 0.50    Years: 36.00    Pack years: 18.00    Types: Cigarettes  . Smokeless tobacco: Never Used  . Tobacco comment: he denies smoking in about 2 weeks 07/13/18  Vaping Use  . Vaping Use: Never used  Substance Use Topics  . Alcohol use: Yes    Alcohol/week: 11.0 standard drinks    Types: 6 Cans of beer, 5 Shots of liquor per week    Comment: weekends, he denies current alcohol use 07/13/18  . Drug use: Yes    Types: Marijuana    Comment: reports cocaine usage ~2X/ month; last use 12/26/17    Home Medications Prior to Admission medications   Medication Sig Start Date End Date Taking? Authorizing Provider  amLODipine (NORVASC) 10 MG tablet TAKE 1 TABLET BY MOUTH EVERY DAY Patient taking differently: Take 10 mg by mouth every evening. 02/19/20   Wendie Agreste, MD  aspirin EC 81 MG tablet Take 81 mg by mouth every evening. Swallow whole.    [provider]  HYDROcodone-acetaminophen (NORCO) 5-325 MG tablet Take 1 tablet by mouth 2 (two) times daily as needed. Patient taking differently: Take 1 tablet by mouth 2 (two) times daily as needed (severe knee pain.). 07/03/20   Aundra Dubin, PA-C  lisinopril (ZESTRIL) 20 MG tablet TAKE 1 TABLET BY MOUTH EVERY DAY Patient taking differently: Take 20 mg by mouth every evening. 02/19/20   Wendie Agreste, MD  Multiple Vitamin (MULTIVITAMIN WITH MINERALS) TABS tablet Take 1 tablet by mouth every evening.    [provider]  Multiple Vitamins-Minerals (MULTI ADULT GUMMIES PO) Take 1 tablet by mouth in the  morning. Centrum    [provider]  Nutritional Supplements (FRUIT & VEGETABLE DAILY PO) Take 4 tablets by mouth daily in the afternoon.    [provider]  tadalafil (CIALIS) 20 MG tablet Take 20 mg by mouth daily as needed for erectile dysfunction. 07/03/20   [provider]  vitamin B-12 (CYANOCOBALAMIN) 1000 MCG tablet Take 1,000 mcg by mouth every  evening.    [provider]    Allergies    Bee venom, Antifungal [miconazole nitrate], and Zolpidem tartrate er  Review of Systems   Review of Systems  Constitutional: Negative for diaphoresis and fever.  HENT: Positive for voice change (hoarseness).   Eyes: Negative for redness.  Respiratory: Negative for cough and shortness of breath.   Cardiovascular: Positive for chest pain. Negative for palpitations and leg swelling.  Gastrointestinal: Negative for abdominal pain, nausea and vomiting.  Genitourinary: Negative for dysuria.  Musculoskeletal: Positive for myalgias (chronic pain in legs). Negative for back pain and neck pain.  Skin: Negative for rash.  Neurological: Negative for syncope and light-headedness.  Psychiatric/Behavioral: The patient is not nervous/anxious.     Physical Exam Updated Vital Signs BP (!) 148/99 (BP Location: Right Arm)   Pulse 93   Temp 98.2 F (36.8 C) (Oral)   Resp 18   Ht 6' (1.829 m)   Wt (!) 142.9 kg   SpO2 96%   BMI 42.73 kg/m   Physical Exam Vitals and nursing note reviewed.  Constitutional:      Appearance: He is well-developed. He is not diaphoretic.  HENT:     Head: Normocephalic and atraumatic.     Mouth/Throat:     Mouth: Mucous membranes are not dry.  Eyes:     Conjunctiva/sclera: Conjunctivae normal.  Neck:     Trachea: Trachea normal. No tracheal deviation.  Cardiovascular:     Rate and Rhythm: Normal rate and regular rhythm.     Pulses: No decreased pulses.     Heart sounds: Normal heart sounds, S1 normal and S2 normal. Heart sounds not  distant. No murmur heard.   Pulmonary:     Effort: Pulmonary effort is normal. No respiratory distress.     Breath sounds: Normal breath sounds. No wheezing.  Chest:     Chest wall: No tenderness.  Abdominal:     General: Bowel sounds are normal.     Palpations: Abdomen is soft.     Tenderness: There is no abdominal tenderness. There is no guarding or rebound.  Musculoskeletal:     Cervical back: Normal range of motion and neck supple. No muscular tenderness.     Right lower leg: No tenderness. No edema.     Left lower leg: No tenderness. No edema.     Comments: Non-tender to palpation.   Skin:    General: Skin is warm and dry.     Coloration: Skin is not pale.  Neurological:     Mental Status: He is alert.     ED Results / Procedures / Treatments   Labs (all labs ordered are listed, but only abnormal results are displayed) Labs Reviewed  CBC WITH DIFFERENTIAL/PLATELET - Abnormal; Notable for the following components:      Result Value   RBC 4.17 (*)    HCT 38.2 (*)    Lymphs Abs 4.1 (*)    All other components within normal limits  COMPREHENSIVE METABOLIC PANEL - Abnormal; Notable for the following components:   Glucose, Bld 105 (*)    All other components within normal limits  D-DIMER, QUANTITATIVE - Abnormal; Notable for the following components:   D-Dimer, Quant 0.68 (*)    All other components within normal limits  TROPONIN I (HIGH SENSITIVITY)  TROPONIN I (HIGH SENSITIVITY)    EKG EKG Interpretation  Date/Time:  Friday Sep 18 2020 21:50:35 EDT Ventricular Rate:  95 PR Interval:  167 QRS Duration: 159 QT  Interval:  404 QTC Calculation: 508 R Axis:   -81 Text Interpretation: Sinus rhythm Probable left atrial enlargement RBBB and LAFB Left ventricular hypertrophy Inferior infarct, acute Lateral leads are also involved Baseline wander in lead(s) V3 V4 V5 V6 No significant change since last tracing Confirmed by Isla Pence [phone]) on 09/18/2020 10:06:38  PM   Radiology No results found.  Procedures Procedures   Medications Ordered in ED Medications - No data to display  ED Course  I have reviewed the triage vital signs and the nursing notes.  Pertinent labs & imaging results that were available during my care of the patient were reviewed by me and considered in my medical decision making (see chart for details).  Patient seen and examined. Work-up initiated. EKG reviewed with Dr. Gilford Raid. Abnormal but unchanged from 2 days ago. Labs, including troponin and d-dimer ordered.   Vital signs reviewed and are as follows: BP (!) 148/99 (BP Location: Right Arm)   Pulse 93   Temp 98.2 F (36.8 C) (Oral)   Resp 18   Ht 6' (1.829 m)   Wt (!) 142.9 kg   SpO2 96%   BMI 42.73 kg/m   Pt discussed with and seen by Dr. Gilford Raid who agrees with plan.   11:52 PM Labs discussed with Dr. Gilford Raid including elevated d-dimer that is slightly elevated with age-adjustment.    I went and had a shared decision making discussion with the patient at bedside.  Both Dr. Gilford Raid and myself have low concern for ACS and PE at this time.  We discussed that his D-dimer is very slightly elevated.  We discussed his risks of blood clots including recent diagnosis of laryngeal squamous cell carcinoma and general anesthesia.  We discussed reassuring elements of his exam including normal heart rate, no shortness of breath, improving chest pain, no lower extremity signs and symptoms of DVT.  We discussed risks and benefits of imaging including radiation exposure and ability to see small blood clots which could cause chest pain.  We also discussed cardiac work-up including negative troponin, EKG that is unchanged but abnormal.  Patient states that he would prefer to be discharged home at this point after this discussion.  We discussed signs and symptoms which should cause him to return.  He states that he is willing to return if symptoms change or get worse.  He  continues to feel well.  I helped the patient ambulate to the restroom.  He was not short of breath or in any distress.  Patient was counseled to return with severe chest pain, especially if the pain is crushing or pressure-like and spreads to the arms, back, neck, or jaw, or if they have sweating, nausea, or shortness of breath with the pain. They were encouraged to call 911 with these symptoms.   The patient verbalized understanding and agreed.     MDM Rules/Calculators/A&P                          Patient here with atypical chest pain.  No cardiac history but several risk factors.  Pain does not radiate and is not associated with diaphoresis or vomiting.  It is not exertional and is improving with time. We considered PE.  D-dimer slightly elevated but borderline.  Shared decision making with patient, offered CT imaging, however after risk/benefit discussion, patient decided he would rather go home.  ACS felt unlikely given character of pain, unchanged EKG, normal troponin greater than 3  hours after onset of pain. Strict return instructions discussed as above. Do not suspect that he has PNA given lack of fever/cough, pneumothorax given clear lung sounds bilaterally.    Final Clinical Impression(s) / ED Diagnoses Final diagnoses:  Precordial pain    Rx / DC Orders ED Discharge Orders    None         Carlisle Cater, PA-C 09/19/20 0009    Isla Pence, MD 09/19/20 1505

## 2020-09-18 NOTE — Discharge Instructions (Signed)
Please read and follow all provided instructions.  Your diagnoses today include:  1. Precordial pain     Tests performed today include:  An EKG of your heart  A chest x-ray  Cardiac enzymes - a blood test for heart muscle damage, no sign of heart attack  Blood counts and electrolytes  D-dimer - screening test for blood clot, slightly elevated  Vital signs. See below for your results today.   Medications prescribed:   None  Take any prescribed medications only as directed.  Follow-up instructions: Please follow-up with your primary care provider as soon as you can for further evaluation of your symptoms.   Return instructions:  SEEK IMMEDIATE MEDICAL ATTENTION IF:  You have severe chest pain, especially if the pain is crushing or pressure-like and spreads to the arms, back, neck, or jaw, or if you have sweating, nausea (feeling sick to your stomach), or shortness of breath. THIS IS AN EMERGENCY. Don't wait to see if the pain will go away. Get medical help at once. Call 911 or [phone] (operator). DO NOT drive yourself to the hospital.   Your chest pain gets worse and does not go away with rest.   You have an attack of chest pain lasting longer than usual, despite rest and treatment with the medications your caregiver has prescribed.   You wake from sleep with chest pain or shortness of breath.  You feel dizzy or faint.  You have chest pain not typical of your usual pain for which you originally saw your caregiver.   You have any other emergent concerns regarding your health.  Additional Information: Chest pain comes from many different causes. Your caregiver has diagnosed you as having chest pain that is not specific for one problem, but does not require admission.  You are at low risk for an acute heart condition or other serious illness.   Your vital signs today were: BP (!) 164/105   Pulse 89   Temp 98.2 F (36.8 C) (Oral)   Resp 15   Ht 6' (1.829 m)   Wt (!) 142.9  kg   SpO2 95%   BMI 42.73 kg/m  If your blood pressure (BP) was elevated above 135/85 this visit, please have this repeated by your doctor within one month. --------------

## 2020-09-19 NOTE — ED Provider Notes (Signed)
[facility]    CSN: [id number] Arrival date & time: 09/18/20  1945      History   Chief Complaint Chief Complaint  Patient presents with  . Chest Pain    HPI JOSEALFREDO ADKINS is a 65 y.o. male history of hypertension, tobacco use, peripheral arterial disease, non-Hodgkin's lymphoma and recent squamous cell carcinoma of larynx presenting today for evaluation of chest pain.  Reports woke up this afternoon with chest pain on his left side.  Pain has been constant since.  Denies any waxing or waning.  Denies associated shortness of breath.  Denies any nausea or vomiting.  Denies dizziness or lightheadedness.  Reports increased stress recently as he recently had a new diagnosis of cancer.  HPI  Past Medical History:  Diagnosis Date  . Allergy   . Anemia    during chemo  . Arthritis    knee   . Blood transfusion without reported diagnosis   . Cancer ([place])    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- last radiation 06-2018  . Cataract    removed both eyes with l;ens implants   . Family history of colon cancer    in his brother- dx'd age 56   . History of chemotherapy    last 06-01-2018  . History of kidney stones   . History of radiation therapy    last radiation 06-2018  . Hyperlipidemia    currently under control  . Hypertension   . Irregular heart beats   . Lymphadenopathy   . Pain, lower leg    Bilateral  . Peripheral arterial disease ([place])   . Pre-diabetes   . Red-green color blindness   . RLS (restless legs syndrome)   . Snores   . Wears glasses     Patient Active Problem List   Diagnosis Date Noted  . 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 Chronic pain of right knee 10/17/2018  . Large cell (diffuse) non-Hodgkin's lymphoma ([place]) 05/10/2018  . Bacteremia due to Escherichia coli   . HTN (hypertension) 04/17/2018  . RLS (restless legs syndrome) 04/17/2018  . Abnormal LFTs 04/17/2018  . Acute metabolic encephalopathy 92/42/6834   . AKI (acute kidney injury) ([place]) 04/16/2018  . Dehydration   . Fever, unspecified   . Sepsis ([place])   . Disorientation   . Hypokalemia   . Hypomagnesemia   . Anemia   . Encounter for antineoplastic chemotherapy   . At high risk of tumor lysis syndrome   . Swelling of lower leg   . Diffuse large B cell lymphoma ([place]) 01/15/2018  . 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[place]) 01/12/2018  . Counseling regarding advance care planning and goals of care 01/12/2018  . Bilateral leg pain 05/27/2014    Past Surgical History:  Procedure Laterality Date  . CATARACT EXTRACTION W/ INTRAOCULAR LENS  IMPLANT, BILATERAL    . COLONOSCOPY    . dislodged salava stone    . FRACTURE SURGERY    . HAND ARTHROPLASTY  1995   crushed left hand  . INGUINAL LYMPH NODE BIOPSY Left 01/02/2018   Procedure: LEFT INGUINAL LYMPH NODE BIOPSY;  Surgeon: Rolm Bookbinder, MD;  Location: [facility];  Service: General;  Laterality: Left;  . IR IMAGING GUIDED PORT INSERTION  01/15/2018  . IR REMOVAL TUN ACCESS W/ PORT W/O FL MOD SED  03/11/2019  . MICROLARYNGOSCOPY Left 01/17/2014   Procedure: MICROLARYNGOSCOPY WITH EXCISION OF THE BIOPSY OF  LEFT VOCAL CORD LESION;  Surgeon: Izora Gala, MD;  Location: [facility];  Service: ENT;  Laterality: Left;  Marland Kitchen MICROLARYNGOSCOPY N/A 09/16/2020   Procedure: MICROLARYNGOSCOPY with Biopsy of vocal cord lesion;  Surgeon: Jerrell Belfast, MD;  Location: [facility];  Service: ENT;  Laterality: N/A;  . ORIF FOOT FRACTURE  2005   left  . REFRACTIVE SURGERY Right    removed cloudiness in right eye after cataract removal        Home Medications    Prior to Admission medications   Medication Sig Start Date End Date Taking? Authorizing Provider  amLODipine (NORVASC) 10 MG tablet TAKE 1 TABLET BY MOUTH EVERY DAY Patient taking differently: Take 10 mg by mouth every evening. 02/19/20   Wendie Agreste, MD  aspirin EC 81 MG tablet Take 81 mg by mouth  every evening. Swallow whole.    [provider]  HYDROcodone-acetaminophen (NORCO) 5-325 MG tablet Take 1 tablet by mouth 2 (two) times daily as needed. Patient taking differently: Take 1 tablet by mouth 2 (two) times daily as needed (severe knee pain.). 07/03/20   Aundra Dubin, PA-C  lisinopril (ZESTRIL) 20 MG tablet TAKE 1 TABLET BY MOUTH EVERY DAY Patient taking differently: Take 20 mg by mouth every evening. 02/19/20   Wendie Agreste, MD  Multiple Vitamin (MULTIVITAMIN WITH MINERALS) TABS tablet Take 1 tablet by mouth every evening.    [provider]  Multiple Vitamins-Minerals (MULTI ADULT GUMMIES PO) Take 1 tablet by mouth in the morning. Centrum    [provider]  Nutritional Supplements (FRUIT & VEGETABLE DAILY PO) Take 4 tablets by mouth daily in the afternoon.    [provider]  tadalafil (CIALIS) 20 MG tablet Take 20 mg by mouth daily as needed for erectile dysfunction. 07/03/20   [provider]  vitamin B-12 (CYANOCOBALAMIN) 1000 MCG tablet Take 1,000 mcg by mouth every evening.    [provider]    Family History Family History  Problem Relation Age of Onset  . Breast cancer Mother   . Diabetes Father   . Hypertension Father   . Stroke Father   . Mental illness Sister   . Hypertension Daughter   . Mental illness Daughter   . Hypertension Brother   . Colon cancer Brother 54       passed away 2018/12/07  . Breast cancer Sister   . Esophageal cancer Neg Hx   . Colon polyps Neg Hx   . Rectal cancer Neg Hx   . Stomach cancer Neg Hx     Social History Social History   Tobacco Use  . Smoking status: Current Some Day Smoker    Packs/day: 0.50    Years: 36.00    Pack years: 18.00    Types: Cigarettes  . Smokeless tobacco: Never Used  . Tobacco comment: he denies smoking in about 2 weeks 07/13/18  Vaping Use  . Vaping Use: Never used  Substance Use Topics  . Alcohol use: Yes    Alcohol/week: 11.0 standard  drinks    Types: 6 Cans of beer, 5 Shots of liquor per week    Comment: weekends, he denies current alcohol use 07/13/18  . Drug use: Yes    Types: Marijuana    Comment: reports cocaine usage ~2X/ month; last use 12/26/17     Allergies   Bee venom, Antifungal [miconazole nitrate], and Zolpidem tartrate er   Review of Systems Review of Systems  Constitutional: Negative for  fatigue and fever.  HENT: Negative for congestion, sinus pressure and sore throat.   Eyes: Negative for photophobia, pain and visual disturbance.  Respiratory: Negative for cough and shortness of breath.   Cardiovascular: Positive for chest pain.  Gastrointestinal: Negative for abdominal pain, nausea and vomiting.  Genitourinary: Negative for decreased urine volume and hematuria.  Musculoskeletal: Negative for myalgias, neck pain and neck stiffness.  Neurological: Negative for dizziness, syncope, facial asymmetry, speech difficulty, weakness, light-headedness, numbness and headaches.     Physical Exam Triage Vital Signs ED Triage Vitals  Enc Vitals Group     BP 09/18/20 2004 (!) 144/91     Pulse Rate 09/18/20 2004 97     Resp 09/18/20 2004 18     Temp 09/18/20 2004 98.6 F (37 C)     Temp Source 09/18/20 2004 Oral     SpO2 09/18/20 2004 94 %     Weight --      Height --      Head Circumference --      Peak Flow --      Pain Score 09/18/20 2001 7     Pain Loc --      Pain Edu? --      Excl. in [place]? --    No data found.  Updated Vital Signs BP (!) 144/91 (BP Location: Right Arm)   Pulse 97   Temp 98.6 F (37 C) (Oral)   Resp 18   SpO2 94%   Visual Acuity Right Eye Distance:   Left Eye Distance:   Bilateral Distance:    Right Eye Near:   Left Eye Near:    Bilateral Near:     Physical Exam Vitals and nursing note reviewed.  Constitutional:      Appearance: He is well-developed.     Comments: No acute distress  HENT:     Head: Normocephalic and atraumatic.     Nose: Nose normal.   Eyes:     Conjunctiva/sclera: Conjunctivae normal.  Cardiovascular:     Rate and Rhythm: Normal rate.  Pulmonary:     Effort: Pulmonary effort is normal. No respiratory distress.     Comments: Breathing comfortably at rest, CTABL, no wheezing, rales or other adventitious sounds auscultated   No reproducible anterior chest tenderness Abdominal:     General: There is no distension.  Musculoskeletal:        General: Normal range of motion.     Cervical back: Neck supple.  Skin:    General: Skin is warm and dry.  Neurological:     Mental Status: He is alert and oriented to person, place, and time.      UC Treatments / Results  Labs (all labs ordered are listed, but only abnormal results are displayed) Labs Reviewed - No data to display  EKG   Radiology No results found.  Procedures Procedures (including critical care time)  Medications Ordered in UC Medications - No data to display  Initial Impression / Assessment and Plan / UC Course  I have reviewed the triage vital signs and the nursing notes.  Pertinent labs & imaging results that were available during my care of the patient were reviewed by me and considered in my medical decision making (see chart for details).     65 year old male with constant chest pain today, EKG normal sinus rhythm with right bundle branch block, appears similar to prior EKG noted 2 days ago prior to onset of symptoms, given patient's age and history recommended  further evaluation in emergency room in order to have troponin to further rule out any underlying cardiac etiology.  Patient verbalized understanding, patient plans to go to emergency room via private car.  Stable on discharge.  Discussed strict return precautions. Patient verbalized understanding and is agreeable with plan.  Final Clinical Impressions(s) / UC Diagnoses   Final diagnoses:  Chest pain, unspecified type   Discharge Instructions   None    ED Prescriptions     None     PDMP not reviewed this encounter.   Janith Lima, [place] 09/19/20 [phone]

## 2020-09-24 ENCOUNTER — Ambulatory Visit: Payer: 59 | Admitting: Sports Medicine

## 2020-10-01 NOTE — Progress Notes (Signed)
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 Jerrell Belfast: "history of chronic hoarseness who underwent previous vocal cord stripping for benign disease has developed worsening hoarseness.  Examination in the office showed a vocal cord mass consistent with possible carcinoma."  Biopsies revealed:  09/16/2020 FINAL MICROSCOPIC DIAGNOSIS:  A. LARYNGEAL MASS, BIOPSY:  - Squamous cell carcinoma.  Nutrition Status Yes No Comments  Weight changes? [x]  []  Reports unintentional weight gain since quitting smoking  Swallowing concerns? []  [x]  States he has some difficulty with dry/crunchy foods, but states he can manage  PEG? []  [x]     Referrals Yes No Comments  Social Work? [x]  []    Dentistry? [x]  []    Swallowing therapy? [x]  []    Nutrition? [x]  []    Med/Onc? [x]  []  Already established with Dr. Sullivan Lone   Safety Issues Yes No Comments  Prior radiation? [x]  []  07/25/2018 through 08/23/2018 Site Technique Total Dose Dose per Fx Completed Fx Beam Energies  Pelvis: Pelvis IMRT 39.6/39.6 1.8 22/22 6X    Pacemaker/ICD? []  [x]    Possible current pregnancy? []  [x]  N/A  Is the patient on methotrexate? []  [x]     Tobacco/Marijuana/Snuff/ETOH use: States he quit smoking this past Monday 09/28/20 (states he had one cigarette yesterday, but didn't enjoy the taste so he believes he'll be able to quit for good); used to smoke 1/2 pack/day for ~18 years. He has never used smokeless tobacco. He reports he has stopped drinking liquor, and only has beers occasionally. He reports occasionally smoking marijuana.   Past/Anticipated interventions by otolaryngology, if any:  09/16/2020 Dr. Jerrell Belfast --Direct Laryngoscopy  --Biopsy right vocal cord lesion  01/17/2014 Dr. Izora Gala MICROLARYNGOSCOPY WITH EXCISION OF THE BIOPSY OF LEFT VOCAL CORD LESION  Past/Anticipated interventions by medical  oncology, if any:  Under care of Dr. Sullivan Lone 05/28/2020 --S/p 6 cycles of EPOCH-R completed on 06/01/18 (for large B-cell lymphoma) PLAN: -Discussed pt labwork today, 05/28/2020; blood counts look normal, chemistries and LDH is wnl. -Discussed inversion table for stretching back to relieve pressure from nerves.  -No lab, clinical, or radiographic evidence of B-Cell Lymphoma recurrence at this time.  -Pt is currently 2 years out from treatment (January 2020). Will continue watchful observation. No indication to treat. Pt continues to be in remission. -Recommend pt continue f/u with PCP regarding age-related cancer screenings and all checkups.  -Will see back in 6 months with labs (11/19/2020).  Current Complaints / other details:  Patient has received the first 3 Pfizer vaccines.

## 2020-10-01 NOTE — Progress Notes (Signed)
Oncology Nurse Navigator Documentation  Placed introductory call to new referral patient Jesus [name].  Introduced myself as the H&N oncology nurse navigator that works with Dr. Isidore Moos to whom he has been referred by Dr. Wilburn Cornelia. He confirmed understanding of referral.  Briefly explained my role as his navigator, provided my contact information.   Confirmed understanding of upcoming appts and [place] location, explained arrival and registration process.  I explained the purpose of a dental evaluation prior to starting RT, indicated he would be contacted by [facility] to arrange an appt.    I encouraged him to call with questions/concerns as he moves forward with appts and procedures.    He verbalized understanding of information provided, expressed appreciation for my call.   Navigator Initial Assessment . Employment Status: he is retired . Currently on FMLA / STD: no . Living Situation: He lives with his son.  . Support System: family . PCP: Shirline Frees MD . PCD: [facility] . Financial Concerns:no . Transportation Needs: no . Sensory Deficits:no . Language Barriers/Interpreter Needed:  no . Ambulation Needs: no . DME Used in Home: no . Psychosocial Needs:  no . Concerns/Needs Understanding Cancer:  addressed/answered by navigator to best of ability . Self-Expressed Needs: no   Harlow Asa RN, BSN, OCN Head & Neck Oncology Nurse [place] [facility] Phone # [phone]  Fax # [phone]

## 2020-10-02 ENCOUNTER — Ambulatory Visit
Admission: RE | Admit: 2020-10-02 | Discharge: 2020-10-02 | Disposition: A | Discharge status: Home / Self Care | Payer: Medicare Other | Source: Ambulatory Visit | Attending: Radiation Oncology | Admitting: Radiation Oncology

## 2020-10-02 ENCOUNTER — Other Ambulatory Visit: Payer: Self-pay

## 2020-10-02 ENCOUNTER — Telehealth: Payer: Self-pay | Admitting: *Deleted

## 2020-10-02 ENCOUNTER — Encounter: Payer: Self-pay | Admitting: Radiation Oncology

## 2020-10-02 VITALS — BP 145/77 | HR 78 | Temp 97.3°F | Resp 18 | Ht 72.0 in | Wt 310.1 lb

## 2020-10-02 DIAGNOSIS — Z79899 Other long term (current) drug therapy: Secondary | ICD-10-CM | POA: Diagnosis not present

## 2020-10-02 DIAGNOSIS — M549 Dorsalgia, unspecified: Secondary | ICD-10-CM | POA: Diagnosis not present

## 2020-10-02 DIAGNOSIS — Z800 Family history of malignant neoplasm of digestive organs: Secondary | ICD-10-CM | POA: Insufficient documentation

## 2020-10-02 DIAGNOSIS — C320 Malignant neoplasm of glottis: Secondary | ICD-10-CM | POA: Diagnosis present

## 2020-10-02 DIAGNOSIS — M129 Arthropathy, unspecified: Secondary | ICD-10-CM | POA: Insufficient documentation

## 2020-10-02 DIAGNOSIS — Z9221 Personal history of antineoplastic chemotherapy: Secondary | ICD-10-CM | POA: Insufficient documentation

## 2020-10-02 DIAGNOSIS — G2581 Restless legs syndrome: Secondary | ICD-10-CM | POA: Insufficient documentation

## 2020-10-02 DIAGNOSIS — Z7982 Long term (current) use of aspirin: Secondary | ICD-10-CM | POA: Insufficient documentation

## 2020-10-02 DIAGNOSIS — G8929 Other chronic pain: Secondary | ICD-10-CM | POA: Diagnosis not present

## 2020-10-02 DIAGNOSIS — Z923 Personal history of irradiation: Secondary | ICD-10-CM | POA: Insufficient documentation

## 2020-10-02 DIAGNOSIS — C329 Malignant neoplasm of larynx, unspecified: Secondary | ICD-10-CM | POA: Insufficient documentation

## 2020-10-02 DIAGNOSIS — F17210 Nicotine dependence, cigarettes, uncomplicated: Secondary | ICD-10-CM | POA: Insufficient documentation

## 2020-10-02 DIAGNOSIS — I739 Peripheral vascular disease, unspecified: Secondary | ICD-10-CM | POA: Diagnosis not present

## 2020-10-02 DIAGNOSIS — E785 Hyperlipidemia, unspecified: Secondary | ICD-10-CM | POA: Diagnosis not present

## 2020-10-02 DIAGNOSIS — Z803 Family history of malignant neoplasm of breast: Secondary | ICD-10-CM | POA: Insufficient documentation

## 2020-10-02 DIAGNOSIS — Z87891 Personal history of nicotine dependence: Secondary | ICD-10-CM

## 2020-10-02 DIAGNOSIS — R5381 Other malaise: Secondary | ICD-10-CM

## 2020-10-02 DIAGNOSIS — I10 Essential (primary) hypertension: Secondary | ICD-10-CM | POA: Diagnosis not present

## 2020-10-02 NOTE — Progress Notes (Signed)
Radiation Oncology         [phone] ________________________________  Outpatient Re-Consultation  Name: Jesus [name] MRN: [id number]  Date: 10/02/2020  DOB: [birth year]  [id number], Gwyndolyn Saxon, MD  Francina Ames, MD   REFERRING PHYSICIAN: Francina Ames, MD  DIAGNOSIS:    ICD-10-CM   1. 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 Ambulatory referral to Social Work   Cancer Staging Diffuse large B-cell lymphoma of lymph nodes of multiple regions [facility]) Staging form: Hodgkin and Non-Hodgkin Lymphoma, AJCC 8th Edition - Clinical: Stage IV - Signed by Eppie Gibson, MD on 07/13/2018  Malignant neoplasm of glottis [facility]) Staging form: Larynx - Glottis, AJCC 8th Edition - Clinical stage from 10/02/2020: Stage I (cT1a, cN0, cM0) - Signed by Eppie Gibson, MD on 10/02/2020 Stage prefix: Initial diagnosis   CHIEF COMPLAINT: Here to discuss management of throat cancer  HISTORY OF PRESENT ILLNESS::Jesus [name] is a 65 y.o. male who presented with history of chronic hoarseness who underwent previous vocal cord stripping for benign disease by Dr Constance Holster years ago and then developed worsening hoarseness.  Examination in the office showed by Dr Wilburn Cornelia revealed a right vocal cord mass consistent with possible carcinoma.  I personally reviewed Dr. Victorio Palm notes and discussed the patient with him.  This lesion did not affect vocal cord mobility and it was confined to the right true vocal cord  CT of neck on 08/27/2020 revealed subtle thickening of the right glottis and no neck adenopathy  Biopsies revealed:  09/16/2020 FINAL MICROSCOPIC DIAGNOSIS:  A. LARYNGEAL MASS, BIOPSY:  - Squamous cell carcinoma.  Nutrition Status Yes No Comments  Weight changes? [x]  []  Reports unintentional weight gain since quitting smoking  Swallowing concerns? []  [x]  States he has some difficulty with dry/crunchy foods, but states he can manage  PEG? []  [x]     Referrals Yes No Comments  Social  Work? [x]  []    Dentistry? [x]  []    Swallowing therapy? [x]  []    Nutrition? [x]  []    Med/Onc? [x]  []  Already established with Dr. Sullivan Lone   Safety Issues Yes No Comments  Prior radiation? [x]  []  07/25/2018 through 08/23/2018 Site Technique Total Dose Dose per Fx Completed Fx Beam Energies  Pelvis: Pelvis IMRT 39.6/39.6 1.8 22/22 6X    Pacemaker/ICD? []  [x]    Possible current pregnancy? []  [x]  N/A  Is the patient on methotrexate? []  [x]     Tobacco/Marijuana/Snuff/ETOH use: States he quit smoking this past Monday 09/28/20 (states he had one cigarette yesterday, but didn't enjoy the taste so he believes he'll be able to quit for good).  Records indicate that he has smoked for over 40 years and smoking history ranges from half a pack to 1 pack/day. My best estimate is that he has a 20+ pack year smoking history  He has never used smokeless tobacco. He reports he has stopped drinking liquor, and only has beers occasionally. He reports occasionally smoking marijuana.   Past/Anticipated interventions by otolaryngology, if any:  09/16/2020 Dr. Jerrell Belfast --Direct Laryngoscopy  --Biopsy right vocal cord lesion  01/17/2014 Dr. Izora Gala MICROLARYNGOSCOPY WITH EXCISION OF THE BIOPSY OF LEFT VOCAL CORD LESION  Past/Anticipated interventions by medical oncology, if any:  Under care of Dr. Sullivan Lone 05/28/2020 --S/p 6 cycles of EPOCH-R completed on 06/01/18 (for large B-cell lymphoma) PLAN: -Discussed pt labwork today, 05/28/2020; blood counts look normal, chemistries and LDH is wnl. -Discussed inversion table for stretching back to relieve pressure from nerves.  -No lab, clinical, or radiographic  evidence of B-Cell Lymphoma recurrence at this time.  -Pt is currently 2 years out from treatment (January 2020). Will continue watchful observation. No indication to treat. Pt continues to be in remission.   Current Complaints / other details:  Patient has received the first 3 Pfizer  vaccines.  He plans to get his next booster shot in the near future at local pharmacy   PAST MEDICAL HISTORY:  has a past medical history of Allergy, Anemia, Arthritis, Blood transfusion without reported diagnosis, Cancer ([place]), Cataract, Family history of colon cancer, History of chemotherapy, History of kidney stones, History of radiation therapy, Hyperlipidemia, Hypertension, Irregular heart beats, Lymphadenopathy, Pain, lower leg, Peripheral arterial disease ([place]), Pre-diabetes, Red-green color blindness, RLS (restless legs syndrome), Snores, and Wears glasses.    PAST SURGICAL HISTORY: Past Surgical History:  Procedure Laterality Date  . CATARACT EXTRACTION W/ INTRAOCULAR LENS  IMPLANT, BILATERAL    . COLONOSCOPY    . dislodged salava stone    . FRACTURE SURGERY    . HAND ARTHROPLASTY  1995   crushed left hand  . INGUINAL LYMPH NODE BIOPSY Left 01/02/2018   Procedure: LEFT INGUINAL LYMPH NODE BIOPSY;  Surgeon: Rolm Bookbinder, MD;  Location: [place];  Service: General;  Laterality: Left;  . IR IMAGING GUIDED PORT INSERTION  01/15/2018  . IR REMOVAL TUN ACCESS W/ PORT W/O FL MOD SED  03/11/2019  . MICROLARYNGOSCOPY Left 01/17/2014   Procedure: MICROLARYNGOSCOPY WITH EXCISION OF THE BIOPSY OF LEFT VOCAL CORD LESION;  Surgeon: Izora Gala, MD;  Location: [place];  Service: ENT;  Laterality: Left;  Marland Kitchen MICROLARYNGOSCOPY N/A 09/16/2020   Procedure: MICROLARYNGOSCOPY with Biopsy of vocal cord lesion;  Surgeon: Jerrell Belfast, MD;  Location: [place];  Service: ENT;  Laterality: N/A;  . ORIF FOOT FRACTURE  2005   left  . REFRACTIVE SURGERY Right    removed cloudiness in right eye after cataract removal     FAMILY HISTORY: family history includes Breast cancer in his mother and sister; Colon cancer (age of onset: 35) in his brother; Diabetes in his father; Hypertension in his brother, daughter, and father; Mental illness in his daughter and sister; Stroke in his father.  SOCIAL  HISTORY:  reports that he quit smoking 4 days ago. His smoking use included cigarettes. He has a 18.00 pack-year smoking history. He has never used smokeless tobacco. He reports current alcohol use of about 6.0 standard drinks of alcohol per week. He reports current drug use. Drugs:  and Marijuana.  ALLERGIES: Bee venom, Antifungal [miconazole nitrate], and Zolpidem tartrate er  MEDICATIONS:  Current Outpatient Medications  Medication Sig Dispense Refill  . amLODipine (NORVASC) 10 MG tablet TAKE 1 TABLET BY MOUTH EVERY DAY (Patient taking differently: Take 10 mg by mouth every evening.) 30 tablet 0  . aspirin EC 81 MG tablet Take 81 mg by mouth every evening. Swallow whole.    Marland Kitchen HYDROcodone-acetaminophen (NORCO) 5-325 MG tablet Take 1 tablet by mouth 2 (two) times daily as needed. (Patient taking differently: Take 1 tablet by mouth 2 (two) times daily as needed (severe knee pain.).) 10 tablet 0  . lisinopril (ZESTRIL) 20 MG tablet TAKE 1 TABLET BY MOUTH EVERY DAY (Patient taking differently: Take 20 mg by mouth every evening.) 30 tablet 0  . Multiple Vitamin (MULTIVITAMIN WITH MINERALS) TABS tablet Take 1 tablet by mouth every evening.    . Multiple Vitamins-Minerals (MULTI ADULT GUMMIES PO) Take 1 tablet by mouth in the morning. Centrum    .  Nutritional Supplements (FRUIT & VEGETABLE DAILY PO) Take 4 tablets by mouth daily in the afternoon.    . tadalafil (CIALIS) 20 MG tablet Take 20 mg by mouth daily as needed for erectile dysfunction.    . vitamin B-12 (CYANOCOBALAMIN) 1000 MCG tablet Take 1,000 mcg by mouth every evening.     No current facility-administered medications for this encounter.    REVIEW OF SYSTEMS:  Notable for that above.   PHYSICAL EXAM:  height is 6' (1.829 m) and weight is 310 lb 2 oz (140.7 kg) (abnormal). His temporal temperature is 97.3 F (36.3 C) (abnormal). His blood pressure is 145/77 (abnormal) and his pulse is 78. His respiration is 18 and oxygen saturation is  98%.   General: Alert and oriented, in no acute distress HEENT: Head is normocephalic. Extraocular movements are intact. Oropharynx is notable for no lesions in the upper throat or mouth. Neck: Neck is notable for no palpable masses Heart: Regular in rate and rhythm with no murmurs, rubs, or gallops. Chest: Clear to auscultation bilaterally, with no rhonchi, wheezes, or rales. Psychiatric: Judgment and insight are intact. Affect is appropriate.  ECOG = 1  0 - Asymptomatic (Fully active, able to carry on all predisease activities without restriction)  1 - Symptomatic but completely ambulatory (Restricted in physically strenuous activity but ambulatory and able to carry out work of a light or sedentary nature. For example, light housework, office work)  2 - Symptomatic, <50% in bed during the day (Ambulatory and capable of all self care but unable to carry out any work activities. Up and about more than 50% of waking hours)  3 - Symptomatic, >50% in bed, but not bedbound (Capable of only limited self-care, confined to bed or chair 50% or more of waking hours)  4 - Bedbound (Completely disabled. Cannot carry on any self-care. Totally confined to bed or chair)  5 - Death   Eustace Pen MM, Creech RH, Tormey DC, et al. [phone]). "Toxicity and response criteria of the [facility]". [place] Oncol. 5 (6): 649-55   LABORATORY DATA:  Lab Results  Component Value Date   WBC 9.9 09/18/2020   HGB 13.3 09/18/2020   HCT 38.2 (L) 09/18/2020   MCV 91.6 09/18/2020   PLT 298 09/18/2020   CMP     Component Value Date/Time   NA 140 09/18/2020 2153   NA 143 08/21/2019 1459   K 3.8 09/18/2020 2153   CL 105 09/18/2020 2153   CO2 25 09/18/2020 2153   GLUCOSE 105 (H) 09/18/2020 2153   BUN 20 09/18/2020 2153   BUN 17 08/21/2019 1459   CREATININE 1.02 09/18/2020 2153   CREATININE 1.05 05/28/2020 1357   CREATININE 1.09 04/29/2014 1930   CALCIUM 9.5 09/18/2020 2153   PROT 7.1  09/18/2020 2153   PROT 7.0 08/21/2019 1459   ALBUMIN 4.2 09/18/2020 2153   ALBUMIN 4.7 08/21/2019 1459   AST 21 09/18/2020 2153   AST 13 (L) 05/28/2020 1357   ALT 18 09/18/2020 2153   ALT 13 05/28/2020 1357   ALKPHOS 53 09/18/2020 2153   BILITOT 0.3 09/18/2020 2153   BILITOT 0.5 05/28/2020 1357   GFRNONAA >60 09/18/2020 2153   GFRNONAA >60 05/28/2020 1357   GFRNONAA 74 04/29/2014 1930   GFRAA >60 11/28/2019 1329   GFRAA 86 04/29/2014 1930      No results found for: TSH   RADIOGRAPHY:  As above    IMPRESSION/PLAN:  This is a delightful patient with  glottic head and neck cancer. I recommend 28 fractions of radiotherapy focused at the larynx.   We discussed the potential risks, benefits, and side effects of radiotherapy. We talked in detail about acute and late effects. We discussed that some of the most bothersome acute effects may be mucositis, dysgeusia, salivary changes, skin irritation, hair loss, dehydration, weight loss and fatigue. We talked about late effects which include but are not necessarily limited to dysphagia, hypothyroidism, nerve injury, permanent skin changes and local hair loss , vascular injury, and potential injury to any of the tissues in the head and neck region.  We discussed the potential but rare complication of injury to irradiated tissues ultimately requiring a laryngectomy. No guarantees of treatment were given. A consent form was signed and placed in the patient's medical record. The patient is enthusiastic about proceeding with treatment. I look forward to participating in the patient's care.    Simulation (treatment planning) will take place in the near future  We also discussed that the treatment of head and neck cancer is a multidisciplinary process to maximize treatment outcomes and quality of life. For this reason the following referrals have been or will be made:   Nutritionist for nutrition support during and after treatment.   Speech language  pathology for swallowing and/or speech therapy.   Social work for social support.    Physical therapy due to chronic back pain.  It is unlikely he will develop lymphedema in the neck due to the small radiation fields.   Baseline labs including TSH.  I asked the patient today about tobacco use. The patient uses tobacco.  I advised the patient to quit. Services were offered by me today including outpatient counseling and pharmacotherapy. I assessed for the willingness to attempt to quit and provided encouragement and demonstrated willingness to make referrals and/or prescriptions to help the patient attempt to quit. The patient has follow-up with the oncologic team to touch base on their tobacco use and /or cessation efforts.  Over 3 minutes were spent on this issue.  He will call the 1 800 quit NOW hotline.  He plans to quit on June 10.  He is considering nicotine gum versus lozenges to aid with cessation.  We discussed measures to reduce the risk of infection during the COVID-19 pandemic.  He will wear his mask and avoid interacting with people indoors as much is realistically possible.  He intends to get his next booster shot soon.  Anderson Malta our navigator will also facilitate a low-dose screening CT scan of the chest given his smoking history, at least 20-pack-year history.  On date of service, in total, I spent 60 minutes on this encounter. Patient was seen in person.  __________________________________________   Eppie Gibson, MD

## 2020-10-02 NOTE — Progress Notes (Signed)
TSH 

## 2020-10-02 NOTE — Progress Notes (Signed)
Oncology Nurse Navigator Documentation  Met with patient during initial consult with Dr. Isidore Moos.  . Further introduced myself as his/their Navigator, explained my role as a member of the Care Team. . Provided New Patient Information packet: o Contact information for physician, this navigator, other members of the Care Team o Advance Directive information ([place] blue pamphlet with LCSW insert); provided [facility] AD booklet at his request,  o Fall Prevention Patient [place] sheet o Symptom Management Clinic information o [facility] campus map with highlight of [place] o SLP Information sheet . Assisted with post-consult appt scheduling. . I encouraged them to call with questions/concerns moving forward.  He verbalized understanding of information provided.  Harlow Asa, RN, BSN, OCN Head & Neck Oncology Nurse [place] at [place] [phone]

## 2020-10-02 NOTE — Telephone Encounter (Signed)
CALLED PATIENT TO INFORM OF CT SCAN ON 10-06-20- ARRIVAL TIME- 12:45 PM @ [facility], NO RESTRICTIONS TO TEST, AND I ALSO INFORMED HIM OF HIS LAB ON 10-07-20 @ 10 AM, SPOKE WITH PATIENT AND HE IS AWARE OF THIS SCAN AND HIS LAB

## 2020-10-06 ENCOUNTER — Other Ambulatory Visit: Payer: Self-pay

## 2020-10-06 ENCOUNTER — Encounter: Payer: Self-pay | Admitting: *Deleted

## 2020-10-06 ENCOUNTER — Encounter: Payer: Self-pay | Admitting: Hematology

## 2020-10-06 ENCOUNTER — Encounter (HOSPITAL_COMMUNITY): Payer: Self-pay

## 2020-10-06 ENCOUNTER — Ambulatory Visit (HOSPITAL_COMMUNITY)
Admission: RE | Admit: 2020-10-06 | Discharge: 2020-10-06 | Disposition: A | Discharge status: Home / Self Care | Payer: Medicare Other | Source: Ambulatory Visit | Attending: Radiation Oncology | Admitting: Radiation Oncology

## 2020-10-06 DIAGNOSIS — Z87891 Personal history of nicotine dependence: Secondary | ICD-10-CM | POA: Diagnosis present

## 2020-10-06 DIAGNOSIS — C320 Malignant neoplasm of glottis: Secondary | ICD-10-CM | POA: Diagnosis present

## 2020-10-06 IMAGING — CT CT CHEST LUNG CANCER SCREENING LOW DOSE W/O CM
2 of 5 series · 15 of 40 positions shown, 18 images · non-contrast
Comparison: [DATE] CT chest, abdomen and pelvis.

CLINICAL DATA: 64-year-old asymptomatic male current smoker with
21.5 pack-year smoking history. New diagnosis of vocal cord cancer.
History of lymphoma.

EXAM:
CT CHEST WITHOUT CONTRAST LOW-DOSE FOR LUNG CANCER SCREENING
TECHNIQUE: Multidetector CT imaging of the chest was performed following the
standard protocol without IV contrast.

[Series 4: lungs · axial · 0.85mm/px · z∈[-289,-15]mm · 12 of 302 slices shown, 15 images]
[im 14/302  mediastinal]
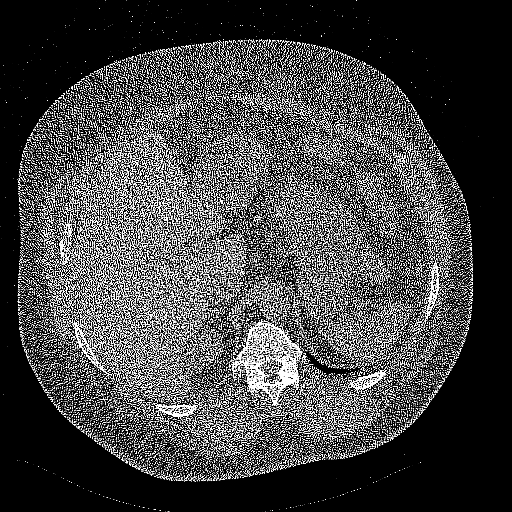
[im 14/302  lung]
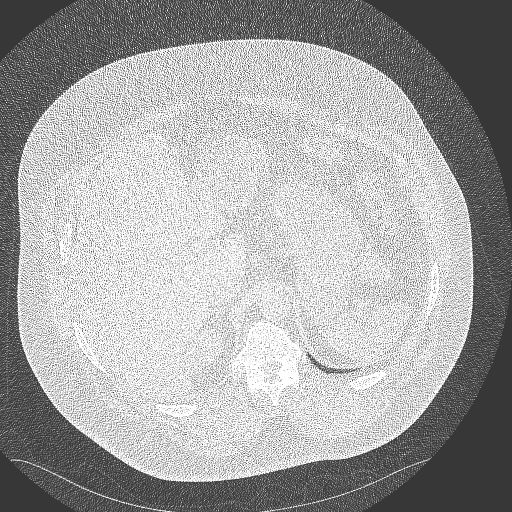
[im 42/302  lung]
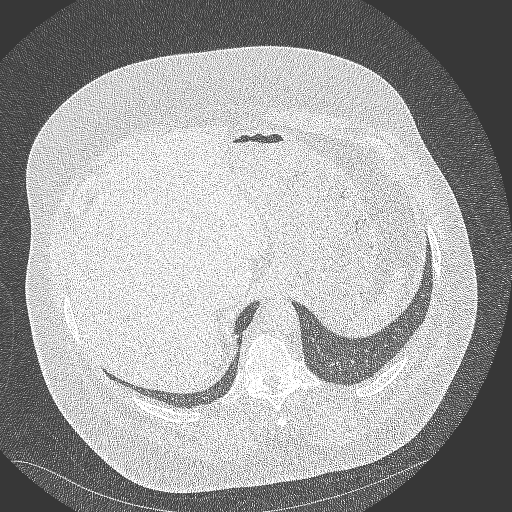
[im 69/302  lung]
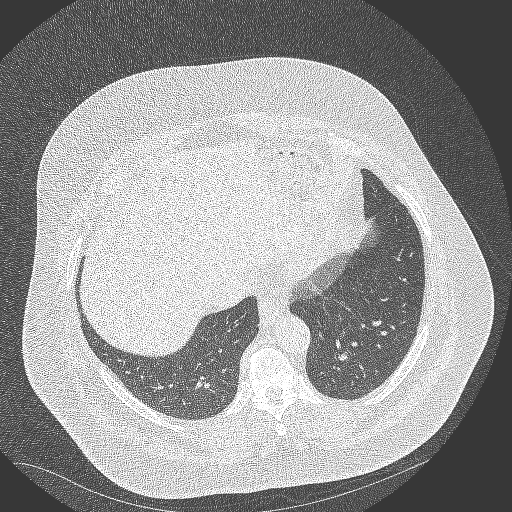
[im 96/302  lung]
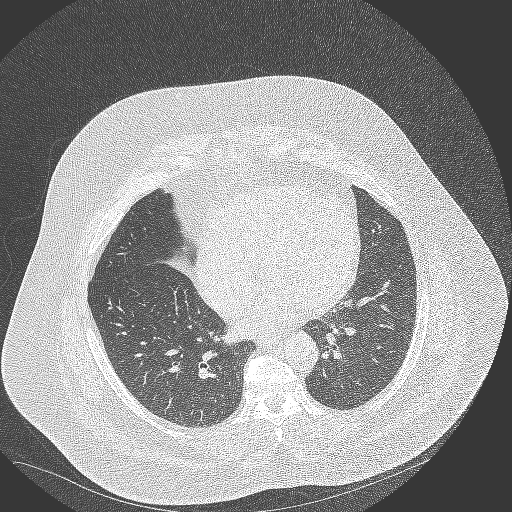
[im 110/302  mediastinal]
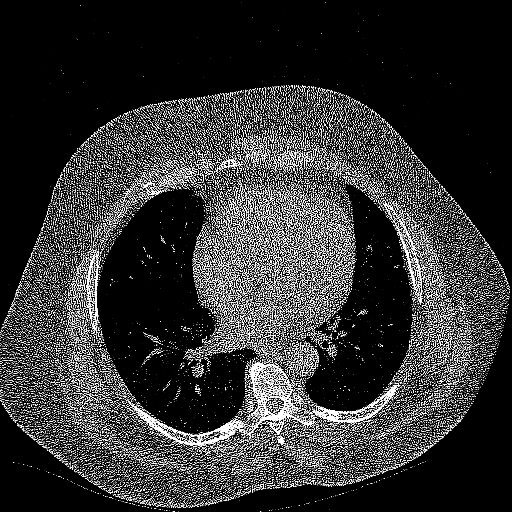
[im 110/302  lung]
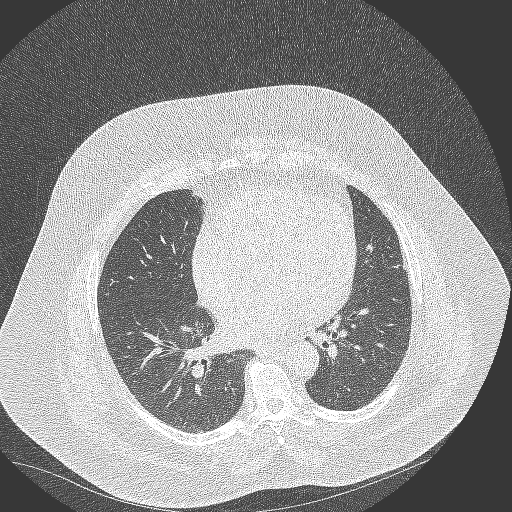
[im 137/302  lung]
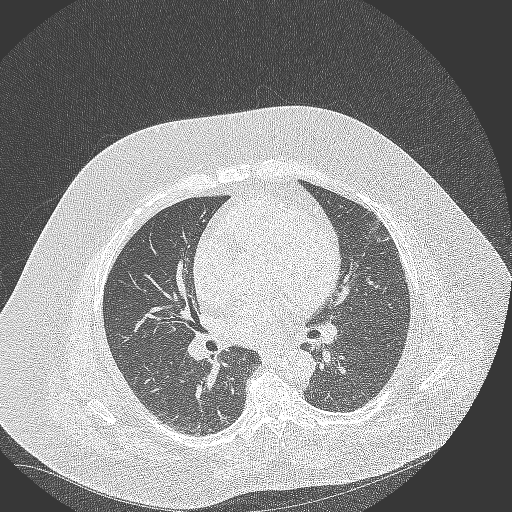
[im 165/302  lung]
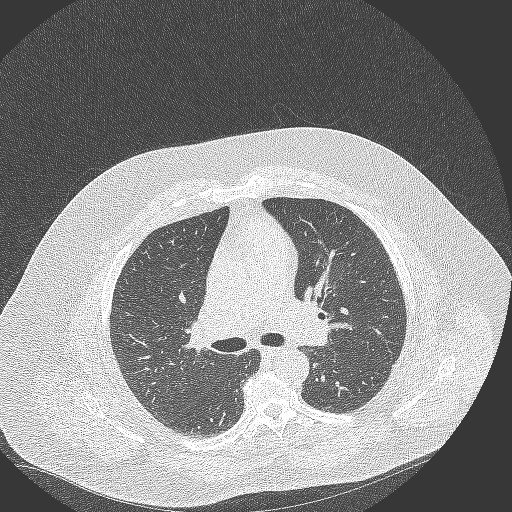
[im 192/302  lung]
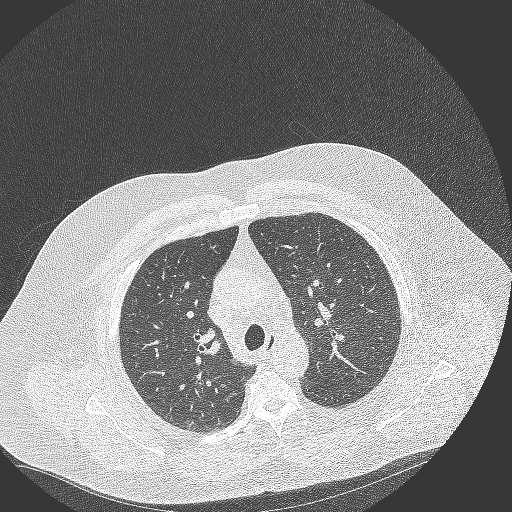
[im 206/302  mediastinal]
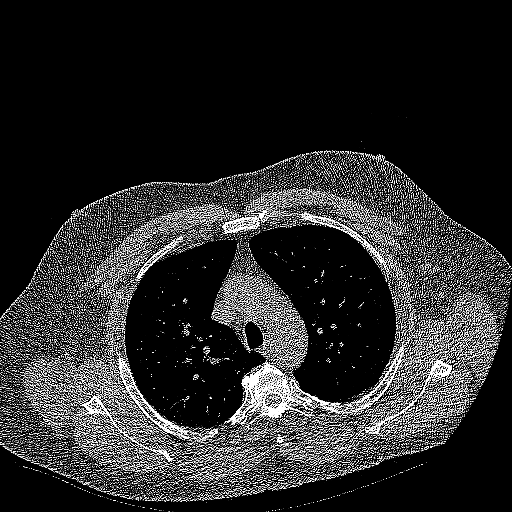
[im 206/302  lung]
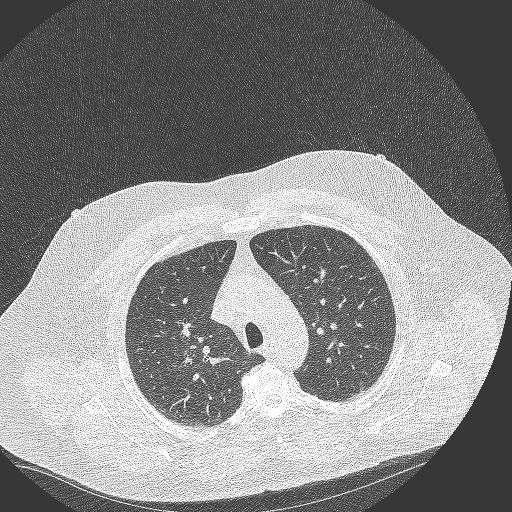
[im 233/302  lung]
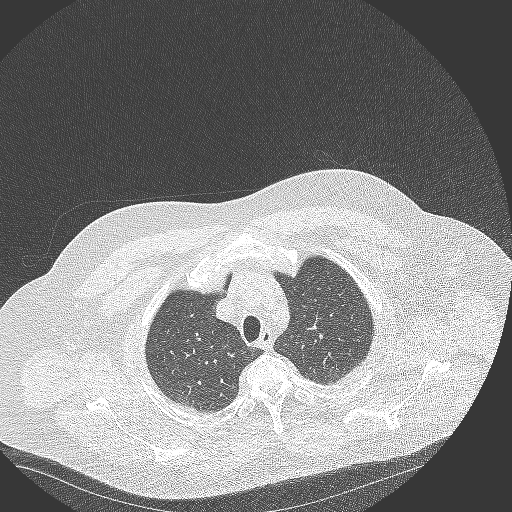
[im 260/302  lung]
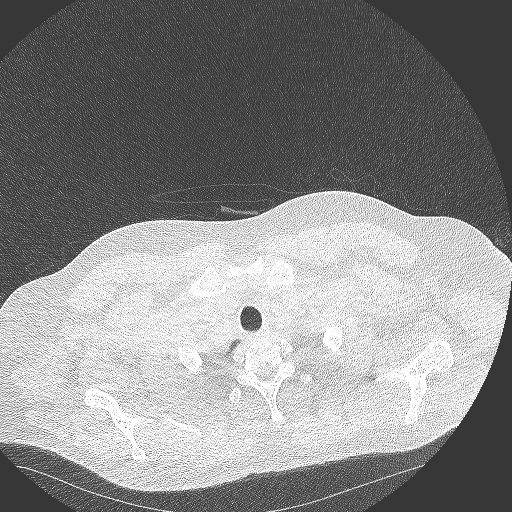
[im 288/302  lung]
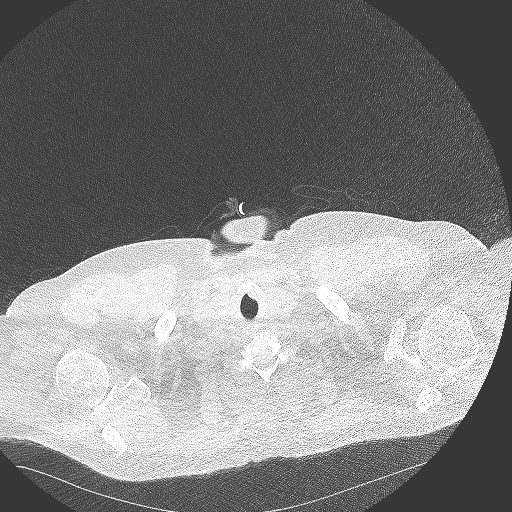

[Series 5: coronal · coronal · 0.66mm/px · 3 of 354 slices shown]
[im 71/354  lung]
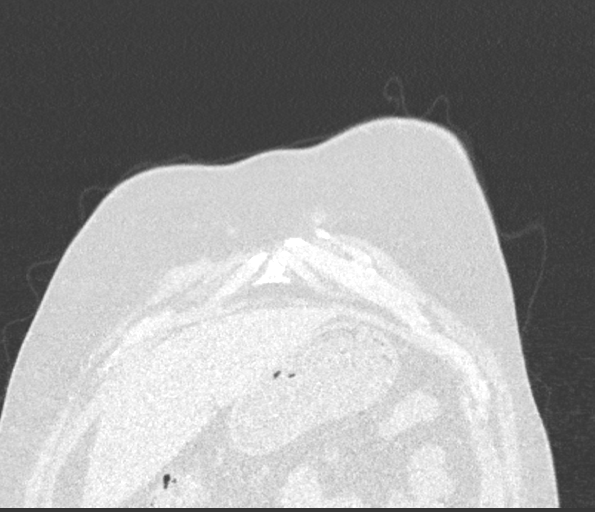
[im 142/354  lung]
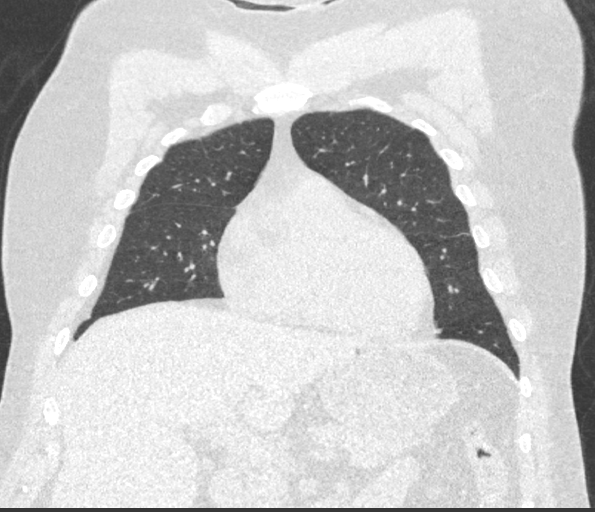
[im 212/354  lung]
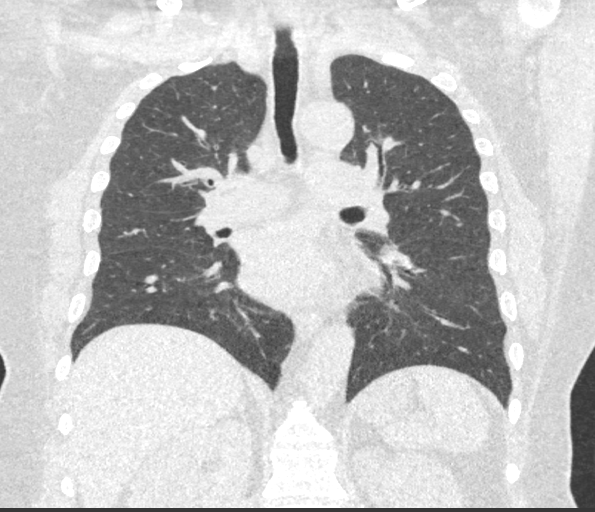

[15 of 40 positions shown; findings below may reference images not displayed]

FINDINGS: Cardiovascular: Top-normal heart size. No significant pericardial
effusion/thickening. Left anterior descending coronary
atherosclerosis. Normal course and caliber of the thoracic aorta.
Stable top-normal caliber main pulmonary artery (3.2 cm diameter).

Mediastinum/Nodes: No discrete thyroid nodules. Unremarkable
esophagus. No pathologically enlarged axillary, mediastinal or hilar
lymph nodes, noting limited sensitivity for the detection of hilar
adenopathy on this noncontrast study.

Lungs/Pleura: No pneumothorax. No pleural effusion. Mild
centrilobular and paraseptal emphysema with diffuse bronchial wall
thickening. No acute consolidative airspace disease or lung masses.
No significant pulmonary nodules.

Upper abdomen: Scattered simple bilateral renal cysts, largest
cm anteriorly on the left.

Musculoskeletal: No aggressive appearing focal osseous lesions.
Moderate thoracic spondylosis.
IMPRESSION: 1. Lung-RADS 1, negative. Continue annual screening with low-dose
chest CT without contrast in 12 months.
2. One vessel coronary atherosclerosis.
3. Emphysema ([VJ]-[VJ]).

## 2020-10-06 NOTE — Progress Notes (Signed)
[place] Work  Initial Assessment   Jesus [name] is a 65 y.o. year old male contacted by phone. Clinical Social Work was referred by radiation oncology for assessment of psychosocial needs.   SDOH (Social Determinants of Health) assessments performed: Yes SDOH Interventions   Flowsheet Row Most Recent Value  SDOH Interventions   Food Insecurity Interventions Intervention Not Indicated  Financial Strain Interventions Intervention Not Indicated  Housing Interventions Intervention Not Indicated  Social Connections Interventions Intervention Not Indicated  Transportation Interventions Other (Comment)      Distress Screen completed: No No flowsheet data found.    Family/Social Information:  . Housing Arrangement: patient lives with 48 year old son. . Family members/support persons in your life? Family and Friends/Colleagues . Transportation concerns: no . Employment: Retired and Disabled. Income source: Jesus [name] . Financial concerns: No o Type of concern: Transportation . Food access concerns: no . Religious or spiritual practice: yes . Medication Concerns: no    Coping/ Adjustment to diagnosis: . Patient understands treatment plan and what happens next? yes . Concerns about diagnosis and/or treatment: Additional financial stress of treatment. . Patient reported stressors: Finances . Hopes and priorities: complete treatment with little side effects.  . Patient enjoys time with family/ friends . Current coping skills/ strengths: Capable of independent living, Communication skills and Supportive family/friends    SUMMARY: Current SDOH Barriers:  . none identified at this time  Clinical Social Work Clinical Goal(s):  . patient will work with SW to address concerns related to treatment needs and barriers.  Interventions: . Discussed common feeling and emotions when being diagnosed with cancer, and the importance  of support during treatment . Informed patient of the support team roles and support services at [facility] . Provided CSW contact information and encouraged patient to call with any questions or concerns . Referred patient to [employer]   Follow Up Plan: Patient will contact CSW with any support or resource needs Patient plans to see CSW prior to his appointment in radiation on 10/07/20.  Patient verbalizes understanding of plan: Yes  Jesus [name], MSW, LCSW, OSW-C Clinical Social Worker [facility] [phone]        Amedeo Kinsman , LCSW

## 2020-10-07 ENCOUNTER — Ambulatory Visit
Admission: RE | Admit: 2020-10-07 | Discharge: 2020-10-07 | Disposition: A | Discharge status: Home / Self Care | Payer: Medicare Other | Source: Ambulatory Visit | Attending: Radiation Oncology | Admitting: Radiation Oncology

## 2020-10-07 ENCOUNTER — Telehealth: Payer: Self-pay | Admitting: Hematology

## 2020-10-07 DIAGNOSIS — C320 Malignant neoplasm of glottis: Secondary | ICD-10-CM | POA: Diagnosis present

## 2020-10-07 DIAGNOSIS — Z510 Encounter for antineoplastic radiation therapy: Secondary | ICD-10-CM | POA: Insufficient documentation

## 2020-10-07 DIAGNOSIS — R5381 Other malaise: Secondary | ICD-10-CM

## 2020-10-07 LAB — TSH: TSH: 0.564 u[IU]/mL (ref 0.320–4.118)

## 2020-10-07 NOTE — Telephone Encounter (Signed)
Scheduled appt per 5/31 sch msg. Called pt, no answer. VM was full, unable to leave a msg. Mailed updated calendar to pt.

## 2020-10-12 ENCOUNTER — Other Ambulatory Visit: Payer: Self-pay

## 2020-10-14 DIAGNOSIS — Z510 Encounter for antineoplastic radiation therapy: Secondary | ICD-10-CM | POA: Diagnosis not present

## 2020-10-15 ENCOUNTER — Inpatient Hospital Stay: Payer: 59 | Attending: Radiation Oncology | Admitting: Nutrition

## 2020-10-15 ENCOUNTER — Ambulatory Visit
Admission: RE | Admit: 2020-10-15 | Discharge: 2020-10-15 | Disposition: A | Discharge status: Home / Self Care | Payer: Medicare Other | Source: Ambulatory Visit | Attending: Radiation Oncology | Admitting: Radiation Oncology

## 2020-10-15 ENCOUNTER — Ambulatory Visit: Payer: Medicare Other | Attending: Radiation Oncology

## 2020-10-15 ENCOUNTER — Other Ambulatory Visit: Payer: Self-pay

## 2020-10-15 DIAGNOSIS — C320 Malignant neoplasm of glottis: Secondary | ICD-10-CM | POA: Insufficient documentation

## 2020-10-15 DIAGNOSIS — M545 Low back pain, unspecified: Secondary | ICD-10-CM | POA: Diagnosis present

## 2020-10-15 DIAGNOSIS — G8929 Other chronic pain: Secondary | ICD-10-CM | POA: Diagnosis present

## 2020-10-15 DIAGNOSIS — R490 Dysphonia: Secondary | ICD-10-CM | POA: Insufficient documentation

## 2020-10-15 DIAGNOSIS — R262 Difficulty in walking, not elsewhere classified: Secondary | ICD-10-CM | POA: Insufficient documentation

## 2020-10-15 DIAGNOSIS — R1310 Dysphagia, unspecified: Secondary | ICD-10-CM | POA: Diagnosis present

## 2020-10-15 DIAGNOSIS — Z510 Encounter for antineoplastic radiation therapy: Secondary | ICD-10-CM | POA: Diagnosis not present

## 2020-10-15 NOTE — Patient Instructions (Signed)
SWALLOWING EXERCISES Do these until 6 months after your last day of radiation, then 2-3 times per week afterwards  Effortful Swallows - Press your tongue against the roof of your mouth for 3 seconds, then squeeze the muscles in your neck while you swallow your saliva or a sip of water - Repeat 10-15 times, 2-3 times a day, and use whenever you eat or drink  [place] Swallow - swallow with your tongue sticking out - Stick tongue out past your lips and gently bite tongue with your teeth - Swallow, while holding your tongue with your teeth - Repeat 10-15 times, 2-3 times a day *use a wet spoon if your mouth gets dry*  Pitch Raise - Repeat "he", once per second in as high of a pitch as you can - Repeat 20 times, 2-3 times a day  [employer] - "half swallow" exercise - Start to swallow, and keep your Adam's apple up by squeezing hard with the muscles of the throat - Hold the squeeze for 5-7 seconds and then relax - Repeat 10-15 times, 2-3 times a day *use a wet spoon if your mouth gets dry*         5.   "Siren" exercise  - Say "oo" or "ah" starting at as low of a pitch as you can, and go up to as high of a pitch as you can, then back down to as low of a pitch as you can  - Repeat 10 times, twice a day

## 2020-10-15 NOTE — Therapy (Signed)
[facility] [address] [place], [place], [address] Phone: [phone]   Fax:  [phone]  Speech Language Pathology Evaluation  Patient Details  Name: Jesus [name] MRN: [id number] Date of Birth: [birth year] Referring Provider (SLP): Eppie Gibson   Encounter Date: 10/15/2020   End of Session - 10/15/20 1657     Visit Number 1    Number of Visits 4    Date for SLP Re-Evaluation 01/13/21    SLP Start Time 0850    SLP Stop Time  0930    SLP Time Calculation (min) 40 min    Activity Tolerance Patient tolerated treatment well             Past Medical History:  Diagnosis Date   Allergy    Anemia    during chemo   Arthritis    knee    Blood transfusion without reported diagnosis    Cancer ([place])    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- last radiation 06-2018   Cataract    removed both eyes with l;ens implants    Family history of colon cancer    in his brother- dx'd age 105    History of chemotherapy    last 06-01-2018   History of kidney stones    History of radiation therapy    last radiation 06-2018   Hyperlipidemia    currently under control   Hypertension    Irregular heart beats    Lymphadenopathy    Pain, lower leg    Bilateral   Peripheral arterial disease ([place])    Pre-diabetes    Red-green color blindness    RLS (restless legs syndrome)    Snores    Wears glasses     Past Surgical History:  Procedure Laterality Date   CATARACT EXTRACTION W/ INTRAOCULAR LENS  IMPLANT, BILATERAL     COLONOSCOPY     dislodged salava stone     FRACTURE SURGERY     HAND ARTHROPLASTY  1995   crushed left hand   INGUINAL LYMPH NODE BIOPSY Left 01/02/2018   Procedure: LEFT INGUINAL LYMPH NODE BIOPSY;  Surgeon: Rolm Bookbinder, MD;  Location: [facility];  Service: General;  Laterality: Left;   IR IMAGING GUIDED PORT INSERTION  01/15/2018   IR REMOVAL TUN ACCESS W/ PORT W/O FL  MOD SED  03/11/2019   MICROLARYNGOSCOPY Left 01/17/2014   Procedure: MICROLARYNGOSCOPY WITH EXCISION OF THE BIOPSY OF LEFT VOCAL CORD LESION;  Surgeon: Izora Gala, MD;  Location: [facility];  Service: ENT;  Laterality: Left;   MICROLARYNGOSCOPY N/A 09/16/2020   Procedure: MICROLARYNGOSCOPY with Biopsy of vocal cord lesion;  Surgeon: Jerrell Belfast, MD;  Location: [facility] OR;  Service: ENT;  Laterality: N/A;   ORIF FOOT FRACTURE  2005   left   REFRACTIVE SURGERY Right    removed cloudiness in right eye after cataract removal     There were no vitals filed for this visit.   Subjective Assessment - 10/15/20 0857     Subjective Pt denies any overt s/sx of aspiration PNA.                SLP Evaluation [facility] - 10/15/20 0859       SLP Visit Information   SLP Received On 10/15/20    Referring Provider (SLP) Eppie Gibson    Onset Date Spring 2022    Medical Diagnosis malignant neoplasm of glottis  Subjective   Patient/Family Stated Goal Have normal swallowing when this is over      General Information   HPI Dr. Constance Holster stripped vocal fold "years ago". Pt has had persistent hoarseness since then. Pt had CT neck 08-27-20 without lymphadenopathy noted. Pertientent PMH: allergy, cancer, hx chemo, hx rad tx, HLD, HTN      Prior Functional Status   Cognitive/Linguistic Baseline Within functional limits      Cognition   Overall Cognitive Status Within Functional Limits for tasks assessed      Auditory Comprehension   Overall Auditory Comprehension Appears within functional limits for tasks assessed      Verbal Expression   Overall Verbal Expression Appears within functional limits for tasks assessed      Oral Motor/Sensory Function   Overall Oral Motor/Sensory Function Appears within functional limits for tasks assessed      Motor Speech   Overall Motor Speech --    Phonation Hoarse   pt rates voice 6/10 (10=normal voice); mod hoarse           Pt currently  tolerates regular diet with thin liquids. POs: Pt ate peanut butter crackers and drank water today without overt s/s aspiration. Thyroid elevation appeared adequate, and swallows appeared timely. Pt's swallow deemed WNL/WFL at this time.   Because data states the risk for dysphagia during and after radiation treatment is high due to undergoing radiation tx, SLP taught pt about the possibility of reduced/limited ability for PO intake during rad tx. SLP encouraged pt to continue swallowing POs as far into rad tx as possible,   SLP educated pt re: changes to swallowing musculature after rad tx, and why adherence to dysphagia HEP provided today and PO consumption was necessary to reduce muscle fibrosis following rad tx. Pt demonstrated understanding of these things to SLP.    SLP then developed a HEP for pt and pt was instructed how to perform exercises involving lingual, vocal, and pharyngeal strengthening. SLP performed each exercise and pt return demonstrated each exercise. SLP ensured pt performance was correct prior to moving on to next exercise. Pt was instructed to complete this program 2 times a day, 6-7 days/week until 6 months after his last rad tx, then x2 a week after that.                  SLP Education - 10/15/20 1656     Education Details HEP procedure, late effects head/neck radiation on swallow function    Person(s) Educated Patient    Methods Explanation;Demonstration;Verbal cues;Handout    Comprehension Verbalized understanding;Returned demonstration;Verbal cues required;Need further instruction              SLP Short Term Goals - 10/15/20 1659       SLP SHORT TERM GOAL #1   Title pt will complete HEP with rare min A    Time 2    Period --   visits, for all STGs   Status New      SLP SHORT TERM GOAL #2   Title pt will tell SLP why pt is completing HEP with modified independence    Time 1    Status New      SLP SHORT TERM GOAL #3   Title pt will  describe 3 overt s/s aspiration PNA with modified independence    Time 3    Status New      SLP SHORT TERM GOAL #4   Title pt will tell SLP how a food  journal could hasten return to a more normalized diet    Time 3    Status New              SLP Long Term Goals - 10/15/20 1700       SLP LONG TERM GOAL #1   Title pt will complete HEP with modified independence over two visits    Time 4    Period --   visits, for all LTGs   Status New      SLP LONG TERM GOAL #2   Title pt will describe how to modify HEP over time, and the timeline associated with reduction in HEP frequency with modified independence over two sessions    Time 6    Status New              Plan - 10/15/20 1657     Clinical Impression Statement At this time pt swallowing is deemed WNL/WFL with regular/thin. SLP designed an individualized HEP for dysphagia and pt completed each exercise on their own with min cues faded to independent. There are no overt s/s aspiration reported by pt at this time. Data indicate that pt's swallow ability will likely decrease over the course of radiation therapy and could very well decline over time following conclusion of their radiation therapy due to muscle disuse atrophy and/or muscle fibrosis. Pt will cont to need to be seen by SLP in order to assess safety of PO intake, assess the need for recommending any objective swallow assessment, and ensuring pt correctly completes the individualized HEP.    Speech Therapy Frequency --   once approx every 4 weeks   Duration --   90 days/12 weeks   Treatment/Interventions Aspiration precaution training;Pharyngeal strengthening exercises;Diet toleration management by SLP;Trials of upgraded texture/liquids;SLP instruction and feedback;Functional tasks;Compensatory strategies;Patient/family education;Internal/external aids    Potential to Achieve Goals Good    SLP Home Exercise Plan provided today    Consulted and Agree with Plan of Care  Patient             Patient will benefit from skilled therapeutic intervention in order to improve the following deficits and impairments:   Dysphagia, unspecified type  Hoarseness    Problem List Patient Active Problem List   Diagnosis Date Noted   Malignant neoplasm of glottis ([place]) 10/02/2020   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   Chronic pain of right knee 10/17/2018   Large cell (diffuse) non-Hodgkin's lymphoma ([place]) 05/10/2018   Bacteremia due to Escherichia coli    HTN (hypertension) 04/17/2018   RLS (restless legs syndrome) 04/17/2018   Abnormal LFTs 00/17/4944   Acute metabolic encephalopathy 96/75/9163   AKI (acute kidney injury) ([place]) 04/16/2018   Dehydration    Fever, unspecified    Sepsis ([place])    Disorientation    Hypokalemia    Hypomagnesemia    Anemia    Encounter for antineoplastic chemotherapy    At high risk of tumor lysis syndrome    Swelling of lower leg    Diffuse large B cell lymphoma ([place]) 01/15/2018   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[place]) 01/12/2018   Counseling regarding advance care planning and goals of care 01/12/2018   Bilateral leg pain 05/27/2014    [facility] ,[place], [place]  10/15/2020, 5:02 PM  [facility] [address] [place], [place], [address] Phone: [phone]   Fax:  [phone]  Name: Jesus [name] MRN: [id number] Date of Birth: Sep 17, 1955

## 2020-10-15 NOTE — Progress Notes (Signed)
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  He is going to receive 28 fractions of radiation therapy and is followed by Dr. Isidore Moos.  Past medical history includes/alcohol/hypertension, prediabetes and non-Hodgkin's lymphoma.  Medications include MVI and vitamin B12.  Labs were reviewed.  Height: 6 feet 0 inches. Weight: 310 pounds on May 27 Usual body weight approximately 300 pounds. BMI: 42.06.  Patient reports he feels well and is not having any nutrition impact symptoms.  It is likely he will experience nutrition impact symptoms secondary to radiation therapy to the vocal cords.  Nutrition diagnosis:  Food and nutrition related knowledge deficit related to vocal cord cancer and associated treatments as evidenced by no prior need for nutrition related information.  Intervention: Educated patient to consume frequent small meals with adequate calories and protein to minimize loss of lean body mass. Education provided on dry mouth/thick saliva. Provided nutrition facts sheets.  Contact information given.  Monitoring, evaluation, goals: Patient will tolerate adequate calories and protein to minimize loss of lean body mass.  Next visit scheduled on Wednesday, June 29.  Patient has contact information contact RD by telephone as needed.  **Disclaimer: This note was dictated with voice recognition software. Similar sounding words can inadvertently be transcribed and this note may contain transcription errors which may not have been corrected upon publication of note.**

## 2020-10-16 ENCOUNTER — Ambulatory Visit
Admission: RE | Admit: 2020-10-16 | Discharge: 2020-10-16 | Disposition: A | Discharge status: Home / Self Care | Payer: Medicare Other | Source: Ambulatory Visit | Attending: Radiation Oncology | Admitting: Radiation Oncology

## 2020-10-16 DIAGNOSIS — Z510 Encounter for antineoplastic radiation therapy: Secondary | ICD-10-CM | POA: Diagnosis not present

## 2020-10-19 ENCOUNTER — Other Ambulatory Visit: Payer: Self-pay

## 2020-10-19 ENCOUNTER — Ambulatory Visit
Admission: RE | Admit: 2020-10-19 | Discharge: 2020-10-19 | Disposition: A | Discharge status: Home / Self Care | Payer: Medicare Other | Source: Ambulatory Visit | Attending: Radiation Oncology | Admitting: Radiation Oncology

## 2020-10-19 DIAGNOSIS — Z510 Encounter for antineoplastic radiation therapy: Secondary | ICD-10-CM | POA: Diagnosis not present

## 2020-10-19 DIAGNOSIS — C320 Malignant neoplasm of glottis: Secondary | ICD-10-CM

## 2020-10-19 MED ORDER — SONAFINE EX EMUL
1.0000 "application " | Freq: Two times a day (BID) | CUTANEOUS | Status: DC
  Administered 2020-10-19: 1 via TOPICAL

## 2020-10-19 NOTE — Progress Notes (Signed)
Pt here for patient teaching.    Pt given Radiation and You booklet, Managing Acute Radiation Side Effects for Head and Neck Cancer handout, skin care instructions, and Sonafine.    Reviewed areas of pertinence such as fatigue, hair loss, mouth changes, skin changes, throat changes, earaches, and taste changes .   Pt able to give teach back of to pat skin, use unscented/gentle soap, and drink plenty of water,apply Sonafine bid, avoid applying anything to skin within 4 hours of treatment, and to use an electric razor if they must shave.   Pt demonstrated understanding and verbalizes understanding of information given and will contact nursing with any questions or concerns.    [email or web address]

## 2020-10-20 ENCOUNTER — Ambulatory Visit
Admission: RE | Admit: 2020-10-20 | Discharge: 2020-10-20 | Disposition: A | Discharge status: Home / Self Care | Payer: Medicare Other | Source: Ambulatory Visit | Attending: Radiation Oncology | Admitting: Radiation Oncology

## 2020-10-20 DIAGNOSIS — Z510 Encounter for antineoplastic radiation therapy: Secondary | ICD-10-CM | POA: Diagnosis not present

## 2020-10-21 ENCOUNTER — Ambulatory Visit
Admission: RE | Admit: 2020-10-21 | Discharge: 2020-10-21 | Disposition: A | Discharge status: Home / Self Care | Payer: Medicare Other | Source: Ambulatory Visit | Attending: Radiation Oncology | Admitting: Radiation Oncology

## 2020-10-21 DIAGNOSIS — Z510 Encounter for antineoplastic radiation therapy: Secondary | ICD-10-CM | POA: Diagnosis not present

## 2020-10-22 ENCOUNTER — Ambulatory Visit: Payer: Medicare Other | Admitting: Physical Therapy

## 2020-10-22 ENCOUNTER — Ambulatory Visit
Admission: RE | Admit: 2020-10-22 | Discharge: 2020-10-22 | Disposition: A | Discharge status: Home / Self Care | Payer: Medicare Other | Source: Ambulatory Visit | Attending: Radiation Oncology | Admitting: Radiation Oncology

## 2020-10-22 ENCOUNTER — Other Ambulatory Visit: Payer: Self-pay

## 2020-10-22 DIAGNOSIS — R262 Difficulty in walking, not elsewhere classified: Secondary | ICD-10-CM

## 2020-10-22 DIAGNOSIS — G8929 Other chronic pain: Secondary | ICD-10-CM

## 2020-10-22 DIAGNOSIS — R1310 Dysphagia, unspecified: Secondary | ICD-10-CM | POA: Diagnosis not present

## 2020-10-22 DIAGNOSIS — Z510 Encounter for antineoplastic radiation therapy: Secondary | ICD-10-CM | POA: Diagnosis not present

## 2020-10-22 DIAGNOSIS — M545 Low back pain, unspecified: Secondary | ICD-10-CM

## 2020-10-22 DIAGNOSIS — C320 Malignant neoplasm of glottis: Secondary | ICD-10-CM

## 2020-10-22 NOTE — Therapy (Signed)
[place], [place], [address] Phone: [phone]   Fax:  [phone]  Physical Therapy Evaluation  Patient Details  Name: Jesus [name] MRN: [id number] Date of Birth: 09-08-1955 Referring Provider (PT): Isidore Moos   Encounter Date: 10/22/2020   PT End of Session - 10/22/20 1117     Visit Number 1    Number of Visits 9    Date for PT Re-Evaluation 01/14/21   will begin treatment on 7/28 at completion of radiation   PT Start Time 0851    PT Stop Time 0940    PT Time Calculation (min) 49 min    Activity Tolerance Patient tolerated treatment well    Behavior During Therapy [facility] for tasks assessed/performed             Past Medical History:  Diagnosis Date   Allergy    Anemia    during chemo   Arthritis    knee    Blood transfusion without reported diagnosis    Cancer ([place])    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- last radiation 06-2018   Cataract    removed both eyes with l;ens implants    Family history of colon cancer    in his brother- dx'd age 23    History of chemotherapy    last 06-01-2018   History of kidney stones    History of radiation therapy    last radiation 06-2018   Hyperlipidemia    currently under control   Hypertension    Irregular heart beats    Lymphadenopathy    Pain, lower leg    Bilateral   Peripheral arterial disease ([place])    Pre-diabetes    Red-green color blindness    RLS (restless legs syndrome)    Snores    Wears glasses     Past Surgical History:  Procedure Laterality Date   CATARACT EXTRACTION W/ INTRAOCULAR LENS  IMPLANT, BILATERAL     COLONOSCOPY     dislodged salava stone     FRACTURE SURGERY     HAND ARTHROPLASTY  1995   crushed left hand   INGUINAL LYMPH NODE BIOPSY Left 01/02/2018   Procedure: LEFT INGUINAL LYMPH NODE BIOPSY;  Surgeon: Rolm Bookbinder, MD;  Location: [facility];  Service: General;  Laterality:  Left;   IR IMAGING GUIDED PORT INSERTION  01/15/2018   IR REMOVAL TUN ACCESS W/ PORT W/O FL MOD SED  03/11/2019   MICROLARYNGOSCOPY Left 01/17/2014   Procedure: MICROLARYNGOSCOPY WITH EXCISION OF THE BIOPSY OF LEFT VOCAL CORD LESION;  Surgeon: Izora Gala, MD;  Location: [facility];  Service: ENT;  Laterality: Left;   MICROLARYNGOSCOPY N/A 09/16/2020   Procedure: MICROLARYNGOSCOPY with Biopsy of vocal cord lesion;  Surgeon: Jerrell Belfast, MD;  Location: [facility] OR;  Service: ENT;  Laterality: N/A;   ORIF FOOT FRACTURE  2005   left   REFRACTIVE SURGERY Right    removed cloudiness in right eye after cataract removal     There were no vitals filed for this visit.    Subjective Assessment - 10/22/20 0851     Subjective I have been having lower back pain and sometimes it is up top. It has been there for a couple months. I was supposed to be doing exercise but my knee is messed up and my circulation is bad. So I have not been as active as I normally would be. The whole R side  hurts. I have swelling in my R hand. I work in the yard all the time. When I got up one day the pain was just there.    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 He started on 6/9 and will complete 7/19,  S/P 6 cycles of EPOCH-R completed on 06/01/18 and Radiation 07/25/18- 08/23/2018 to his Pelvis for Large B-cell Lymphoma, DDD    Patient Stated Goals to be able to get back in shape and exercise more, play golf, swlm, get back to being myself    Currently in Pain? Yes    Pain Score 3     Pain Location Back    Pain Orientation Lower    Pain Descriptors / Indicators  Throbbing    Pain Type Acute pain    Pain Onset More than a month ago    Pain Frequency Intermittent    Aggravating Factors  sitting down on bed without support on back    Pain Relieving Factors sitting in recliner    Effect of Pain on Daily Activities does not effect ADLs, works through pain                [facility] PT Assessment - 10/22/20 0001       Assessment   Medical Diagnosis glottic cancer    Referring Provider (PT) Isidore Moos    Onset Date/Surgical Date 08/27/20    Hand Dominance Right    Prior Therapy 2 or 3 yrs ago for a couple of days then it shut down for covid was for mobility      Precautions   Precautions Other (comment)   DDD of back     Restrictions   Weight Bearing Restrictions No      Balance Screen   Has the patient fallen in the past 6 months No    Has the patient had a decrease in activity level because of a fear of falling?  No    Is the patient reluctant to leave their home because of a fear of falling?  No      Home Environment   Living Environment Private residence    Living Arrangements Children   son 62   Available Help at Discharge Family;Friend(s)    Type of Home House      Prior Function   Level of Independence Independent    Vocation Retired    Leisure does not currently exercise      Cognition   Overall Cognitive Status Within Functional Limits for tasks assessed      Observation/Other Assessments   Observations some swelling of R hand with lump at wrist that is soft - pt has made doctor aware and was told to schedule an appt with his PCP      Functional Tests   Functional tests Sit to Stand      Sit to Stand   Comments 30 sec sit to stand: 12 reps avg is 15 for his age      Posture/Postural Control   Posture/Postural Control Postural limitations    Postural Limitations Rounded Shoulders;Forward head      ROM / Strength   AROM / PROM / Strength AROM;Strength  AROM   Overall AROM Comments lumbar ROM WFL    Cervical  Flexion WFL    Cervical Extension WFL    Cervical - Right Side Bend WFL    Cervical - Left Side Bend WFL    Cervical - Right Rotation WFL    Cervical - Left Rotation [facility]      Strength   Right Hip Flexion 5/5    Left Hip Flexion 5/5    Right Knee Flexion 5/5    Right Knee Extension 5/5    Left Knee Flexion 5/5    Left Knee Extension 5/5    Right Ankle Dorsiflexion 5/5    Left Ankle Dorsiflexion 5/5      Ambulation/Gait   Ambulation/Gait Yes    Ambulation/Gait Assistance 7: Independent    Ambulation Distance (Feet) 10 Feet    Gait Pattern Within Functional Limits               LYMPHEDEMA/ONCOLOGY QUESTIONNAIRE - 10/22/20 0001       Lymphedema Assessments   Lymphedema Assessments Head and Neck      Head and Neck   4 cm superior to sternal notch around neck 44.3 cm    6 cm superior to sternal notch around neck 45 cm    8 cm superior to sternal notch around neck 47 cm                     Objective measurements completed on examination: See above findings.               PT Education - 10/22/20 1116     Education Details Neck ROM, importance of posture when sitting, standing and lying down, deep breathing, walking program and importance of staying active throughout treatment, CURE article on staying active, "Why exercise?" flyer, lymphedema and PT info    Person(s) Educated Patient    Methods Explanation;Handout    Comprehension Verbalized understanding                 PT Long Term Goals - 10/22/20 1123       PT LONG TERM GOAL #1   Title Pt will return to baseline cervical ROM and not demonstrate any signs of lymphedema.    Time 12    Period Weeks    Status New    Target Date 01/14/21      PT LONG TERM GOAL #2   Title Pt will report back pain a max of 2/10 in the evening to allow improved comfort.    Baseline 7/10    Time 12    Period Weeks    Status New    Target Date 01/14/21      PT LONG TERM GOAL #3   Title Pt will  report he is able to walk for longer periods of time without fatigue as he does his activities of daily living    Time 12    Period Weeks    Status New    Target Date 01/14/21      PT LONG TERM GOAL #4   Title Pt will be able to single limb stance on either leg for at least 10 seconds each to decrease fall risk    Baseline unable    Time 12    Period Weeks    Status New    Target Date 01/14/21      PT LONG TERM GOAL #5   Title Pt will report he is able to complete  yard work without back pain to allow him to return to prior level of function.    Time 12    Period Weeks    Status New    Target Date 01/14/21                Head and Neck Clinic Goals - 10/22/20 1123       Patient will be able to verbalize understanding of a home exercise program for cervical range of motion, posture, and walking.          Time 1    Period Days    Status Achieved      Patient will be able to verbalize understanding of proper sitting and standing posture.          Time 1    Period Days    Status Achieved      Patient will be able to verbalize understanding of lymphedema risk and availability of treatment for this condition.          Time 1    Period Days    Status Achieved                Plan - 10/22/20 0940     Clinical Impression Statement Pt reports to PT with recent diagnosis of cancer of glottis. He is undergoing radiation currently. In 2020 he received chemo and radiation for treatment of B cell lymphoma. Pt reports prior to this he was very active but over the last two years has been less active and more fatigued from cancer treatments. He was just returning to exercise when he was diagnosed with cancer of glottis. Per his doctor his risk of developing lymphedema is minimal but educated pt anyways letting him know that is risk is minimal. Overall LE strength is 5/5 and back ROM was [facility]. He did not have one specific motion that triggered pain. He does report his pain is  worsened when he sits in a chair without back support for long periods of time. He spends a lot of time in his recliner. Educated pt about importance of core strength for supporting his back. He has a history of degenerative disc disease. He also reports decreased circulation in both legs that causes pain with long periods of standing, walking or sitting. He was seeing a vein specialist but has not been back since his cancer diagnosis. Educated pt about importance of following up with the vein specialist for this. Pt would benefit from skilled PT services to improve core strength and overally mobility to help decrease back pain and fatigue. Pt would like to hold off on starting therapy until completion of radation.Educated pt about signs and symptoms of lymphedema as well as anatomy and physiology of lymphatic system. Educated pt in importance of staying as active as possible throughout treatment to decrease fatigue as well as head and neck ROM exercises to decrease loss of ROM. Will see pt after completion of radiation to reassess ROM and assess for lymphedema and to begin therapy for back pain.    Personal Factors and Comorbidities Fitness;Time since onset of injury/illness/exacerbation;Comorbidity 1;Comorbidity 2    Comorbidities hx of lymphoma in 2020 with chemo and radiation, PAD    Examination-Activity Limitations Lift;Stand;Stairs    Examination-Participation Restrictions Community Activity;Yard Work    Stability/Clinical Decision Making Evolving/Moderate complexity    Clinical Decision Making Moderate    Rehab Potential Good    PT Frequency 2x / week   starting at completion of radiation on 12/03/20  PT Duration 4 weeks    PT Treatment/Interventions ADLs/Self Care Home Management;Therapeutic activities;Therapeutic exercise;Neuromuscular re-education;Patient/family education;Manual techniques    PT Next Visit Plan reassess neck circumferences, reassess shoulder and cervical ROM, begin core  exercises and STM to right side of back where pt has pain    PT Home Exercise Plan head and neck ROM exercises    Consulted and Agree with Plan of Care Patient             Patient will benefit from skilled therapeutic intervention in order to improve the following deficits and impairments:  Pain, Postural dysfunction, Decreased activity tolerance, Decreased knowledge of precautions, Increased muscle spasms, Difficulty walking, Decreased mobility, Decreased endurance  Visit Diagnosis: Chronic right-sided low back pain without sciatica  Difficulty in walking, not elsewhere classified  Malignant neoplasm of glottis [facility])     Problem List Patient Active Problem List   Diagnosis Date Noted   Malignant neoplasm of glottis ([place]) 10/02/2020   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   Chronic pain of right knee 10/17/2018   Large cell (diffuse) non-Hodgkin's lymphoma ([place]) 05/10/2018   Bacteremia due to Escherichia coli    HTN (hypertension) 04/17/2018   RLS (restless legs syndrome) 04/17/2018   Abnormal LFTs 67/20/9470   Acute metabolic encephalopathy 96/28/3662   AKI (acute kidney injury) ([place]) 04/16/2018   Dehydration    Fever, unspecified    Sepsis ([place])    Disorientation    Hypokalemia    Hypomagnesemia    Anemia    Encounter for antineoplastic chemotherapy    At high risk of tumor lysis syndrome    Swelling of lower leg    Diffuse large B cell lymphoma ([place]) 01/15/2018   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[place]) 01/12/2018   Counseling regarding advance care planning and goals of care 01/12/2018   Bilateral leg pain 05/27/2014    Allyson Sabal [facility] 10/22/2020, 11:25 AM  [place], [place], [address] Phone: [phone]   Fax:  [phone]  Name: Jesus [name] MRN: [id number] Date of Birth: 16-Jun-1955  Manus Gunning, PT 10/22/20 11:26 AM

## 2020-10-22 NOTE — Progress Notes (Signed)
Oncology Nurse Navigator Documentation   I met with Mr. Clementson briefly before his scheduled appointment with Allyson Sabal PT today during Head and Neck [facility]. He is tolerating treatment well and knows to call me with any concerns. He has seen Garald Balding SLP at his outpatient clinic for SLP during radiation.   Harlow Asa RN, BSN, OCN Head & Neck Oncology Nurse [place] at [facility] Phone # [phone]  Fax # [phone]

## 2020-10-23 ENCOUNTER — Ambulatory Visit
Admission: RE | Admit: 2020-10-23 | Discharge: 2020-10-23 | Disposition: A | Discharge status: Home / Self Care | Payer: Medicare Other | Source: Ambulatory Visit | Attending: Radiation Oncology | Admitting: Radiation Oncology

## 2020-10-23 DIAGNOSIS — Z510 Encounter for antineoplastic radiation therapy: Secondary | ICD-10-CM | POA: Diagnosis not present

## 2020-10-26 ENCOUNTER — Ambulatory Visit
Admission: RE | Admit: 2020-10-26 | Discharge: 2020-10-26 | Disposition: A | Discharge status: Home / Self Care | Payer: Medicare Other | Source: Ambulatory Visit | Attending: Radiation Oncology | Admitting: Radiation Oncology

## 2020-10-26 ENCOUNTER — Other Ambulatory Visit: Payer: Self-pay

## 2020-10-26 ENCOUNTER — Other Ambulatory Visit: Payer: Self-pay | Admitting: Radiation Oncology

## 2020-10-26 DIAGNOSIS — Z510 Encounter for antineoplastic radiation therapy: Secondary | ICD-10-CM | POA: Diagnosis not present

## 2020-10-26 DIAGNOSIS — C320 Malignant neoplasm of glottis: Secondary | ICD-10-CM

## 2020-10-26 MED ORDER — LIDOCAINE VISCOUS HCL 2 % MT SOLN: OROMUCOSAL | 3 refills | Status: DC

## 2020-10-27 ENCOUNTER — Ambulatory Visit
Admission: RE | Admit: 2020-10-27 | Discharge: 2020-10-27 | Disposition: A | Discharge status: Home / Self Care | Payer: Medicare Other | Source: Ambulatory Visit | Attending: Radiation Oncology | Admitting: Radiation Oncology

## 2020-10-27 DIAGNOSIS — Z510 Encounter for antineoplastic radiation therapy: Secondary | ICD-10-CM | POA: Diagnosis not present

## 2020-10-28 ENCOUNTER — Other Ambulatory Visit: Payer: Self-pay

## 2020-10-28 ENCOUNTER — Ambulatory Visit
Admission: RE | Admit: 2020-10-28 | Discharge: 2020-10-28 | Disposition: A | Discharge status: Home / Self Care | Payer: Medicare Other | Source: Ambulatory Visit | Attending: Radiation Oncology | Admitting: Radiation Oncology

## 2020-10-28 DIAGNOSIS — Z510 Encounter for antineoplastic radiation therapy: Secondary | ICD-10-CM | POA: Diagnosis not present

## 2020-10-29 ENCOUNTER — Ambulatory Visit
Admission: RE | Admit: 2020-10-29 | Discharge: 2020-10-29 | Disposition: A | Discharge status: Home / Self Care | Payer: Medicare Other | Source: Ambulatory Visit | Attending: Radiation Oncology | Admitting: Radiation Oncology

## 2020-10-29 DIAGNOSIS — Z510 Encounter for antineoplastic radiation therapy: Secondary | ICD-10-CM | POA: Diagnosis not present

## 2020-10-30 ENCOUNTER — Ambulatory Visit
Admission: RE | Admit: 2020-10-30 | Discharge: 2020-10-30 | Disposition: A | Discharge status: Home / Self Care | Payer: Medicare Other | Source: Ambulatory Visit | Attending: Radiation Oncology | Admitting: Radiation Oncology

## 2020-10-30 DIAGNOSIS — Z510 Encounter for antineoplastic radiation therapy: Secondary | ICD-10-CM | POA: Diagnosis not present

## 2020-11-02 ENCOUNTER — Ambulatory Visit
Admission: RE | Admit: 2020-11-02 | Discharge: 2020-11-02 | Disposition: A | Discharge status: Home / Self Care | Payer: Medicare Other | Source: Ambulatory Visit | Attending: Radiation Oncology | Admitting: Radiation Oncology

## 2020-11-02 ENCOUNTER — Other Ambulatory Visit: Payer: Self-pay

## 2020-11-02 DIAGNOSIS — Z510 Encounter for antineoplastic radiation therapy: Secondary | ICD-10-CM | POA: Diagnosis not present

## 2020-11-03 ENCOUNTER — Ambulatory Visit
Admission: RE | Admit: 2020-11-03 | Discharge: 2020-11-03 | Disposition: A | Discharge status: Home / Self Care | Payer: Medicare Other | Source: Ambulatory Visit | Attending: Radiation Oncology | Admitting: Radiation Oncology

## 2020-11-03 ENCOUNTER — Other Ambulatory Visit: Payer: Self-pay | Admitting: Radiation Oncology

## 2020-11-03 ENCOUNTER — Other Ambulatory Visit: Payer: Self-pay

## 2020-11-03 DIAGNOSIS — Z510 Encounter for antineoplastic radiation therapy: Secondary | ICD-10-CM | POA: Diagnosis not present

## 2020-11-03 MED ORDER — OXYCODONE-ACETAMINOPHEN 5-325 MG PO TABS: 1.0000 | ORAL_TABLET | ORAL | 0 refills | Status: DC | PRN

## 2020-11-04 ENCOUNTER — Ambulatory Visit
Admission: RE | Admit: 2020-11-04 | Discharge: 2020-11-04 | Disposition: A | Discharge status: Home / Self Care | Payer: Medicare Other | Source: Ambulatory Visit | Attending: Radiation Oncology | Admitting: Radiation Oncology

## 2020-11-04 ENCOUNTER — Inpatient Hospital Stay: Payer: 59 | Admitting: Nutrition

## 2020-11-04 DIAGNOSIS — Z510 Encounter for antineoplastic radiation therapy: Secondary | ICD-10-CM | POA: Diagnosis not present

## 2020-11-04 NOTE — Progress Notes (Signed)
Nutrition follow-up completed with patient before radiation therapy for right vocal cord cancer.  Patient has completed 14 out of 28 fractions of radiation therapy and is followed by Dr. Isidore Moos.  Weight is stable and was documented as 311.4 pounds on June 28. Patient reports increased thickened saliva, cough, and increased pain.  Reports he feels as if something does not quite go down when he swallows.  He wonders if this is because of his thickened saliva. MD suggested Robitussin. Reports cough is most bothersome.  Yesterday he was able to eat grilled chicken, squash with zucchini, Crowder peas and okra, tomatoes, and a bologna sandwich.  He states he is forcing himself to eat no matter how much pain he is in.  Nutrition diagnosis: Food and nutrition related knowledge deficit improved.  Intervention: Educated patient to continue baking soda and salt water rinses more frequently to help thin secretions. Continue increased fluids. Take medications as prescribed by MD.  Patient states any weight loss is secondary to discontinuing alcohol. Provided support and encouragement.  Congratulated patient on his successes.  Monitoring, evaluation, goals: Patient will tolerate adequate calories and protein to minimize loss of lean body mass.  Next visit: Thursday, July 5 after radiation therapy.  **Disclaimer: This note was dictated with voice recognition software. Similar sounding words can inadvertently be transcribed and this note may contain transcription errors which may not have been corrected upon publication of note.**

## 2020-11-05 ENCOUNTER — Other Ambulatory Visit: Payer: Self-pay

## 2020-11-05 ENCOUNTER — Ambulatory Visit
Admission: RE | Admit: 2020-11-05 | Discharge: 2020-11-05 | Disposition: A | Discharge status: Home / Self Care | Payer: Medicare Other | Source: Ambulatory Visit | Attending: Radiation Oncology | Admitting: Radiation Oncology

## 2020-11-05 DIAGNOSIS — Z510 Encounter for antineoplastic radiation therapy: Secondary | ICD-10-CM | POA: Diagnosis not present

## 2020-11-06 ENCOUNTER — Ambulatory Visit
Admission: RE | Admit: 2020-11-06 | Discharge: 2020-11-06 | Disposition: A | Discharge status: Home / Self Care | Payer: Medicare Other | Source: Ambulatory Visit | Attending: Radiation Oncology | Admitting: Radiation Oncology

## 2020-11-06 DIAGNOSIS — C320 Malignant neoplasm of glottis: Secondary | ICD-10-CM | POA: Diagnosis present

## 2020-11-06 DIAGNOSIS — Z510 Encounter for antineoplastic radiation therapy: Secondary | ICD-10-CM | POA: Diagnosis present

## 2020-11-10 ENCOUNTER — Ambulatory Visit
Admission: RE | Admit: 2020-11-10 | Discharge: 2020-11-10 | Disposition: A | Discharge status: Home / Self Care | Payer: Medicare Other | Source: Ambulatory Visit | Attending: Radiation Oncology | Admitting: Radiation Oncology

## 2020-11-10 ENCOUNTER — Ambulatory Visit: Payer: 59 | Admitting: Nutrition

## 2020-11-10 ENCOUNTER — Other Ambulatory Visit: Payer: Self-pay

## 2020-11-10 DIAGNOSIS — Z510 Encounter for antineoplastic radiation therapy: Secondary | ICD-10-CM | POA: Diagnosis not present

## 2020-11-10 NOTE — Progress Notes (Signed)
Nutrition follow-up completed with patient after radiation therapy for right vocal cord cancer.  Weight is stable at about 310 pounds.  Patient reports painful swallowing continues.  He also is having thick saliva.  Denies any change in his eating habits.  Reports he is going to eat no matter what.  States his cough has increased and he thinks it is due to thick saliva.  Nutrition diagnosis: Food and nutrition related knowledge deficit improved.  Intervention: Reminded patient to continue strategies for decreasing thickened saliva including plenty of fluids and baking soda and salt water rinses. Take medications as prescribed by MD. Continue weight maintenance.  Monitoring, evaluation, goals: Patient will tolerate adequate calories and protein for minimal weight loss.  Next visit: Follow-up to be scheduled.  **Disclaimer: This note was dictated with voice recognition software. Similar sounding words can inadvertently be transcribed and this note may contain transcription errors which may not have been corrected upon publication of note.**

## 2020-11-11 ENCOUNTER — Ambulatory Visit
Admission: RE | Admit: 2020-11-11 | Discharge: 2020-11-11 | Disposition: A | Discharge status: Home / Self Care | Payer: Medicare Other | Source: Ambulatory Visit | Attending: Radiation Oncology | Admitting: Radiation Oncology

## 2020-11-11 DIAGNOSIS — Z510 Encounter for antineoplastic radiation therapy: Secondary | ICD-10-CM | POA: Diagnosis not present

## 2020-11-12 ENCOUNTER — Other Ambulatory Visit: Payer: Self-pay

## 2020-11-12 ENCOUNTER — Ambulatory Visit
Admission: RE | Admit: 2020-11-12 | Discharge: 2020-11-12 | Disposition: A | Discharge status: Home / Self Care | Payer: Medicare Other | Source: Ambulatory Visit | Attending: Radiation Oncology | Admitting: Radiation Oncology

## 2020-11-12 DIAGNOSIS — Z510 Encounter for antineoplastic radiation therapy: Secondary | ICD-10-CM | POA: Diagnosis not present

## 2020-11-13 ENCOUNTER — Ambulatory Visit
Admission: RE | Admit: 2020-11-13 | Discharge: 2020-11-13 | Disposition: A | Discharge status: Home / Self Care | Payer: Medicare Other | Source: Ambulatory Visit | Attending: Radiation Oncology | Admitting: Radiation Oncology

## 2020-11-13 DIAGNOSIS — Z510 Encounter for antineoplastic radiation therapy: Secondary | ICD-10-CM | POA: Diagnosis not present

## 2020-11-16 ENCOUNTER — Other Ambulatory Visit: Payer: Self-pay

## 2020-11-16 ENCOUNTER — Ambulatory Visit
Admission: RE | Admit: 2020-11-16 | Discharge: 2020-11-16 | Disposition: A | Discharge status: Home / Self Care | Payer: Medicare Other | Source: Ambulatory Visit | Attending: Radiation Oncology | Admitting: Radiation Oncology

## 2020-11-16 DIAGNOSIS — Z510 Encounter for antineoplastic radiation therapy: Secondary | ICD-10-CM | POA: Diagnosis not present

## 2020-11-17 ENCOUNTER — Ambulatory Visit
Admission: RE | Admit: 2020-11-17 | Discharge: 2020-11-17 | Disposition: A | Discharge status: Home / Self Care | Payer: Medicare Other | Source: Ambulatory Visit | Attending: Radiation Oncology | Admitting: Radiation Oncology

## 2020-11-17 DIAGNOSIS — Z510 Encounter for antineoplastic radiation therapy: Secondary | ICD-10-CM | POA: Diagnosis not present

## 2020-11-18 ENCOUNTER — Ambulatory Visit
Admission: RE | Admit: 2020-11-18 | Discharge: 2020-11-18 | Disposition: A | Discharge status: Home / Self Care | Payer: Medicare Other | Source: Ambulatory Visit | Attending: Radiation Oncology | Admitting: Radiation Oncology

## 2020-11-18 ENCOUNTER — Other Ambulatory Visit: Payer: Self-pay

## 2020-11-18 DIAGNOSIS — Z510 Encounter for antineoplastic radiation therapy: Secondary | ICD-10-CM | POA: Diagnosis not present

## 2020-11-18 NOTE — Progress Notes (Signed)
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 male is here because of left lower extremity edema and lymphadenopathy.  The patient was seen in the emergency room this past Friday for the same issue.  A CT of the abdomen and pelvis was performed showing bulky left inguinal, left hemipelvic, and retroperitoneal adenopathy.  He was referred to [place] from the emergency room for further evaluation.  Doppler ultrasound of the left lower extremity was performed and was negative for DVT. Patient reports that he has been having left lower extremity edema in his left groin and left leg for approximately 1 month.  He states that the swelling in the left groin started to get better but then worsened.  The left lower extremity edema has slowly worsened over time.  Patient denies having fevers and chills.  He reports that he does have night sweats at times.  He reported having headaches approximate 1 [place]  He thinks his headaches are related to not having his blood pressure medication.  His headaches have now resolved.  He denies visual changes.  The patient denies chest pain, shortness of breath and cough.  No nausea, vomiting, constipation, diarrhea.  Denies abdominal pain.  The patient denies recent weight loss and has actually gained weight recently.  Patient denies epistaxis, bleeding gums, hemoptysis, hematuria,  but occasionally, and melena.  He reports increased urinary frequency over the past month but no dysuria.  The patient is here for evaluation and discussion of his recent CT and lab findings.  Interval History:   Jesus [name] returns today for management and evaluation of his T-Cell/histocyte rich Large B-Cell Lymphoma. The patient's last visit with [place] was on 05/28/2020. [name].  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 He has now quit smoking since June 9 and not had any since. He notes constant soreness in his throat. He takes Oxycodone and Lidocaine. He is continuing to eat well though and notes the coughing is very bothersome. His ENT is Dr. Wilburn Cornelia. He notes his knee pain is no longer bothersome.  Lab results today 11/19/2020 of CBC w/diff and CMP is as follows: all values are WNL except for RBC of 4.12, Hgb of 12.2, HCT of 36.3, Glucose of 128, AST of 12. 11/19/2020 LDH of 176.  On review of systems, pt reports throat soreness, cough and denies knee pain, new lumps/bumps, fevers, chills, night sweats, thrush, mouth sores, leg swelling, changes in bowel habits, and any other symptoms.   MEDICAL HISTORY:   Past Medical History:  Diagnosis Date   Allergy    Anemia    during chemo   Arthritis    knee    Blood transfusion without reported diagnosis    Cancer ([place])    Non- Hodgkins  lymphoma IV- large B Cell Lymphoma - last chemo 06-01-2018- last radiation 06-2018   Cataract    removed both eyes with l;ens implants    Family history of colon cancer    in his brother- dx'd age 69    History of chemotherapy    last 06-01-2018   History of kidney stones    History of radiation therapy    last radiation 06-2018   Hyperlipidemia    currently under control   Hypertension    Irregular heart beats    Lymphadenopathy    Pain, lower leg     Bilateral   Peripheral arterial disease (HCC)    Pre-diabetes    Red-green color blindness    RLS (restless legs syndrome)    Snores    Wears glasses     SURGICAL HISTORY: Past Surgical History:  Procedure Laterality Date   CATARACT EXTRACTION W/ INTRAOCULAR LENS  IMPLANT, BILATERAL     COLONOSCOPY     dislodged salava stone     FRACTURE SURGERY     HAND ARTHROPLASTY  1995   crushed left hand   INGUINAL LYMPH NODE BIOPSY Left 01/02/2018   Procedure: LEFT INGUINAL LYMPH NODE BIOPSY;  Surgeon: Rolm Bookbinder, MD;  Location: [facility];  Service: General;  Laterality: Left;   IR IMAGING GUIDED PORT INSERTION  01/15/2018   IR REMOVAL TUN ACCESS W/ PORT W/O FL MOD SED  03/11/2019   MICROLARYNGOSCOPY Left 01/17/2014   Procedure: MICROLARYNGOSCOPY WITH EXCISION OF THE BIOPSY OF LEFT VOCAL CORD LESION;  Surgeon: Izora Gala, MD;  Location: [facility];  Service: ENT;  Laterality: Left;   MICROLARYNGOSCOPY N/A 09/16/2020   Procedure: MICROLARYNGOSCOPY with Biopsy of vocal cord lesion;  Surgeon: Jerrell Belfast, MD;  Location: [facility];  Service: ENT;  Laterality: N/A;   ORIF FOOT FRACTURE  2005   left   REFRACTIVE SURGERY Right    removed cloudiness in right eye after cataract removal     SOCIAL HISTORY: Social History   Socioeconomic History   Marital status: Divorced    Spouse name: Not on file   Number of children: 3   Years of education: Not on file   Highest education level: Not on file  Occupational History   Not on file  Tobacco Use   Smoking status: Former    Packs/day: 0.50    Years: 36.00    Pack years: 18.00    Types: Cigarettes    Quit date: 09/28/2020    Years since quitting: 0.1   Smokeless tobacco: Never   Tobacco comments:    he denies smoking in about 2 weeks 07/13/18  Vaping Use   Vaping Use: Never used  Substance and Sexual Activity   Alcohol use: Yes    Alcohol/week: 6.0 standard drinks    Types: 6 Cans of beer per week    Comment: weekends    Drug use: Yes    Types: Marijuana    Comment: reports cocaine usage ~2X/ month; last use 12/26/17   Sexual activity: Not on file  Other Topics Concern   Not on file  Social History Narrative   Not on file   Social Determinants of Health   Financial Resource Strain: Low Risk    Difficulty of Paying Living Expenses: Not very hard  Food Insecurity: No Food Insecurity   Worried About Running Out of Food in the Last Year: Never true   Ran Out of Food in the Last Year: Never true  Transportation Needs: No Data processing manager (Medical): No   Lack of Transportation (Non-Medical): No  Physical Activity: Not on file  Stress: Not on file  Social Connections: Moderately Integrated   Frequency of Communication with Friends and Family: Three times a week   Frequency of Social Gatherings with Friends and Family: Three times a week   Attends Religious Services: 1 to 4 times per year   Active Member of Clubs or Organizations: Yes   Attends Archivist Meetings: 1 to 4 times per year   Marital Status: Divorced  Human resources officer Violence: Not on file    FAMILY HISTORY: Family History  Problem Relation Age of Onset   Breast cancer Mother    Diabetes Father    Hypertension Father    Stroke Father    Mental illness Sister    Hypertension Daughter    Mental illness Daughter    Hypertension Brother    Colon cancer Brother 67       passed away 12-29-18   Breast cancer Sister    Esophageal cancer Neg Hx    Colon polyps Neg Hx    Rectal cancer Neg Hx    Stomach cancer Neg Hx     ALLERGIES:  is allergic to bee venom, antifungal [miconazole nitrate], and zolpidem tartrate er.  MEDICATIONS:  Current Outpatient Medications  Medication Sig Dispense Refill   amLODipine (NORVASC) 10 MG tablet TAKE 1 TABLET BY MOUTH EVERY DAY (Patient taking differently: Take 10 mg by mouth every evening.) 30 tablet 0   aspirin EC 81 MG tablet Take 81 mg by mouth every evening.  Swallow whole.     lidocaine (XYLOCAINE) 2 % solution Patient: Mix 1part 2% viscous lidocaine, 1part H20. Swallow 62m of diluted mixture, 377m before meals and at bedtime, up to QID. 200 mL 3   lisinopril (ZESTRIL) 20 MG tablet TAKE 1 TABLET BY MOUTH EVERY DAY (Patient taking differently: Take 20 mg by mouth every evening.) 30 tablet 0   Multiple Vitamin (MULTIVITAMIN WITH MINERALS) TABS tablet Take 1 tablet by mouth every evening.     Multiple Vitamins-Minerals (MULTI ADULT GUMMIES PO) Take 1 tablet by mouth in the morning. Centrum     Nutritional Supplements (FRUIT & VEGETABLE DAILY PO) Take 4 tablets by mouth daily in the afternoon.     oxyCODONE-acetaminophen (PERCOCET/ROXICET) 5-325 MG tablet Take 1 tablet by mouth every 4 (four) hours as needed for severe pain. 30 tablet 0   tadalafil (CIALIS) 20 MG tablet Take 20 mg by mouth daily as needed for erectile dysfunction.     vitamin B-12 (CYANOCOBALAMIN) 1000 MCG tablet Take 1,000 mcg by mouth every evening.     No current facility-administered medications for this visit.    REVIEW OF SYSTEMS:   10 Point review of Systems was done is negative except as noted above.  PHYSICAL EXAMINATION: ECOG PERFORMANCE STATUS: 1 - Symptomatic but completely ambulatory  Vitals:   11/19/20 1104  BP: (!) 158/91  Pulse: 75  Resp: 17  Temp: 98.2 F (36.8 C)  SpO2: 97%    Filed Weights   11/19/20 1104  Weight: (!) 310 lb 8 oz (140.8 kg)     GENERAL:alert, in no acute distress and comfortable SKIN: no acute rashes, no significant lesions EYES: conjunctiva are pink and non-injected, sclera anicteric OROPHARYNX: MMM, no exudates, no oropharyngeal erythema or ulceration. Mild redness in throat due to ongoing radiation treatment. NECK: supple, no JVD LYMPH:  no palpable  lymphadenopathy in the cervical, axillary or inguinal regions LUNGS: clear to auscultation b/l with normal respiratory effort HEART: regular rate & rhythm ABDOMEN:  normoactive  bowel sounds , non tender, not distended. Extremity: no pedal edema PSYCH: alert & oriented x 3 with fluent speech NEURO: no focal motor/sensory deficits   LABORATORY DATA:   I have reviewed the data as listed CBC Latest Ref Rng & Units 11/19/2020 09/18/2020 05/28/2020  WBC 4.0 - 10.5 K/uL 6.4 9.9 6.6  Hemoglobin 13.0 - 17.0 g/dL 12.2(L) 13.3 12.5(L)  Hematocrit 39.0 - 52.0 % 36.3(L) 38.2(L) 38.9(L)  Platelets 150 - 400 K/uL 317 298 303   CBC    Component Value Date/Time   WBC 6.4 11/19/2020 1011   RBC 4.12 (L) 11/19/2020 1011   HGB 12.2 (L) 11/19/2020 1011   HGB 12.1 (L) 08/22/2019 1325   HCT 36.3 (L) 11/19/2020 1011   PLT 317 11/19/2020 1011   PLT 264 08/22/2019 1325   MCV 88.1 11/19/2020 1011   MCV 87.3 12/22/2017 1518   MCH 29.6 11/19/2020 1011   MCHC 33.6 11/19/2020 1011   RDW 12.6 11/19/2020 1011   LYMPHSABS 1.8 11/19/2020 1011   MONOABS 0.6 11/19/2020 1011   EOSABS 0.2 11/19/2020 1011   BASOSABS 0.1 11/19/2020 1011    CMP Latest Ref Rng & Units 11/19/2020 09/18/2020 09/16/2020  Glucose 70 - 99 mg/dL 128(H) 105(H) 146(H)  BUN 8 - 23 mg/dL 15 20 9   Creatinine 0.61 - 1.24 mg/dL 1.02 1.02 0.95  Sodium 135 - 145 mmol/L 141 140 138  Potassium 3.5 - 5.1 mmol/L 4.4 3.8 3.7  Chloride 98 - 111 mmol/L 103 105 105  CO2 22 - 32 mmol/L 29 25 25   Calcium 8.9 - 10.3 mg/dL 9.4 9.5 9.2  Total Protein 6.5 - 8.1 g/dL 7.3 7.1 6.6  Total Bilirubin 0.3 - 1.2 mg/dL 0.8 0.3 0.5  Alkaline Phos 38 - 126 U/L 66 53 48  AST 15 - 41 U/L 12(L) 21 20  ALT 0 - 44 U/L 10 18 15    . Lab Results  Component Value Date   LDH 176 11/19/2020    01/02/18 Left Inguinal LN Bx:   12/26/17 Tissue Flow Cytometry:   12/26/17 Inguinal Core biopsy:    RADIOGRAPHIC STUDIES: I have personally reviewed the radiological images as listed and agreed with the findings in the report. No results found.  ASSESSMENT & PLAN:   This is a pleasant 65 y.o. 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   CT of the abdomen and pelvis performed on 12/22/2017 showed bulky left inguinal, left hemipelvic, and  retroperitoneal adenopathy.     01/02/18 Left inguinal LN Biopsy revealed T-Cell/histocyte rich Large B-Cell Lymphoma   12/27/17 ECHO revealed LV EF of 55-60%    01/05/18 PET/CT revealed Massively enlarged pelvic lymph nodes intense metabolic activity consistent lymphoma. 2. Additional hypermetabolic lymph nodes in the porta hepatis and retroperitoneum LEFT aorta. 3. Solitary hypermetabolic mediastinal lymph node in the upper LEFT Mediastinum. 4. 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 5. Normal spleen.     HIV non reactive on 12/22/2017.  Hep C and hep B serology negative.   03/14/18 PET/CT revealed PET-CT findings suggest an excellent response to chemotherapy. The abdominal lymphadenopathy has near completely resolved and  demonstrates a near complete metabolic response. The pelvic and inguinal adenopathy has significantly decreased in size and the metabolic activity has significantly decreased. 2. Diffuse marrow activity likely due to chemotherapy and or marrow stimulating drugs. I do not see any discrete persistent lesions.    04/17/18 CT Head revealed Subtle mesial caudothalamic hypodensities may be artifact though, the could reflect encephalitis or Wernicke's encephalopathy. Consider MRI of the head with and without contrast. 2. Mild chronic small vessel ischemic changes    S/p 6 cycles of EPOCH-R completed on 06/01/18  06/28/18 PET/CT revealed Continued good response to treatment. No residual measurable or hypermetabolic abdominal lymphadenopathy and no recurrent osseous disease. 2. Interval decrease  in size of the left operator region lymph node and also the left inguinal lymph node. However, the both have small foci of slightly increased hypermetabolism which bears surveillance. 3. No new or progressive lymphadenopathy in the neck, chest, abdomen or pelvis.  S/p 39.6 Gy in 22 fractions between 07/25/18 and 08/23/18  02/13/2019 CT C/A/P ([id number]) ([id number]) revealed "1. Response to therapy of pelvic adenopathy compared to the PET of 06/28/2018. 2. No new or progressive disease. 3. Mild prostatomegaly. 4. 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 5. Hepatomegaly."   2) left lower extremity swelling- now resolved  Doppler ultrasound for DVT was negative in the left lower extremity.   Likely from venous compression +/- lymphatic obstruction from bulky left inguinal, left hemipelvic, and  retroperitoneal adenopathy.    3) S/p Port a cath placement - removed 03/11/2019   4) h/o E.coli UTI - Pansensitive - Resolved. Also appeared to have BPH like symptomatology. -Was hospitalized 04/16/18 to 04/20/18 for E.coli sepsis from UTI, treated with antibiotics.    5) s/p Symptomatic Anemia Hgb 6.7 - due to chemotherapy/sepsis-  s/p PRBC transfusion. No overt evidence of bleeding. 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 -Will need to watch pt's thyroid function post-radiation.  -Advised pt the radiation related changes should settle down in around 2-6 weeks. -Pt is currently 2.5 years out from completion of his lymphoma treatment (January 2020). Will continue watchful observation. No evidence of  -Recommend pt continue f/u with PCP regarding age-related cancer screenings and all checkups.  -Will  see back in 6 months with labs.   FOLLOW UP: RTC with Dr Irene Limbo with labs in 6 months   The total time spent in the appointment was 30 minutes and more than 50% was on counseling and direct patient cares.  All of the patient's questions were answered with apparent satisfaction. The patient knows to call the clinic with any problems, questions or concerns.   Sullivan Lone MD [place] AAHIVMS [facility] Hematology/Oncology Physician [facility]  (Office):       [phone] (Work cell):  [phone] (Fax):           [phone]  I, Reinaldo Raddle, am acting as scribe for Dr. Sullivan Lone, MD.   .I have reviewed the above documentation for accuracy and completeness, and I agree with the above. Brunetta Genera MD

## 2020-11-19 ENCOUNTER — Other Ambulatory Visit: Payer: 59

## 2020-11-19 ENCOUNTER — Ambulatory Visit
Admission: RE | Admit: 2020-11-19 | Discharge: 2020-11-19 | Disposition: A | Discharge status: Home / Self Care | Payer: Medicare Other | Source: Ambulatory Visit | Attending: Radiation Oncology | Admitting: Radiation Oncology

## 2020-11-19 ENCOUNTER — Ambulatory Visit: Payer: 59 | Admitting: Hematology

## 2020-11-19 ENCOUNTER — Inpatient Hospital Stay: Payer: Medicare Other | Attending: Radiation Oncology

## 2020-11-19 ENCOUNTER — Inpatient Hospital Stay: Payer: Medicare Other | Admitting: Hematology

## 2020-11-19 VITALS — BP 158/91 | HR 75 | Temp 98.2°F | Resp 17 | Wt 310.5 lb

## 2020-11-19 DIAGNOSIS — Z8572 Personal history of non-Hodgkin lymphomas: Secondary | ICD-10-CM | POA: Insufficient documentation

## 2020-11-19 DIAGNOSIS — C8330 Diffuse large B-cell lymphoma, unspecified site: Secondary | ICD-10-CM

## 2020-11-19 DIAGNOSIS — Z7982 Long term (current) use of aspirin: Secondary | ICD-10-CM | POA: Diagnosis not present

## 2020-11-19 DIAGNOSIS — Z923 Personal history of irradiation: Secondary | ICD-10-CM | POA: Diagnosis not present

## 2020-11-19 DIAGNOSIS — I10 Essential (primary) hypertension: Secondary | ICD-10-CM | POA: Insufficient documentation

## 2020-11-19 DIAGNOSIS — Z87891 Personal history of nicotine dependence: Secondary | ICD-10-CM | POA: Insufficient documentation

## 2020-11-19 DIAGNOSIS — Z510 Encounter for antineoplastic radiation therapy: Secondary | ICD-10-CM | POA: Diagnosis not present

## 2020-11-19 DIAGNOSIS — Z79899 Other long term (current) drug therapy: Secondary | ICD-10-CM | POA: Diagnosis not present

## 2020-11-19 LAB — CMP (CANCER CENTER ONLY)
ALT: 10 U/L (ref 0–44)
AST: 12 U/L — ABNORMAL LOW (ref 15–41)
Albumin: 4 g/dL (ref 3.5–5.0)
Alkaline Phosphatase: 66 U/L (ref 38–126)
Anion gap: 9 (ref 5–15)
BUN: 15 mg/dL (ref 8–23)
CO2: 29 mmol/L (ref 22–32)
Calcium: 9.4 mg/dL (ref 8.9–10.3)
Chloride: 103 mmol/L (ref 98–111)
Creatinine: 1.02 mg/dL (ref 0.61–1.24)
GFR, Estimated: >60 mL/min (ref >60)
Glucose, Bld: 128 mg/dL — ABNORMAL HIGH (ref 70–99)
Potassium: 4.4 mmol/L (ref 3.5–5.1)
Sodium: 141 mmol/L (ref 135–145)
Total Bilirubin: 0.8 mg/dL (ref 0.3–1.2)
Total Protein: 7.3 g/dL (ref 6.5–8.1)

## 2020-11-19 LAB — CBC WITH DIFFERENTIAL/PLATELET
Abs Immature Granulocytes: 0.03 10*3/uL (ref 0.00–0.07)
Basophils Absolute: 0.1 10*3/uL (ref 0.0–0.1)
Basophils Relative: 1 %
Eosinophils Absolute: 0.2 10*3/uL (ref 0.0–0.5)
Eosinophils Relative: 2 %
HCT: 36.3 % — ABNORMAL LOW (ref 39.0–52.0)
Hemoglobin: 12.2 g/dL — ABNORMAL LOW (ref 13.0–17.0)
Immature Granulocytes: 1 %
Lymphocytes Relative: 28 %
Lymphs Abs: 1.8 10*3/uL (ref 0.7–4.0)
MCH: 29.6 pg (ref 26.0–34.0)
MCHC: 33.6 g/dL (ref 30.0–36.0)
MCV: 88.1 fL (ref 80.0–100.0)
Monocytes Absolute: 0.6 10*3/uL (ref 0.1–1.0)
Monocytes Relative: 10 %
Neutro Abs: 3.8 10*3/uL (ref 1.7–7.7)
Neutrophils Relative %: 58 %
Platelets: 317 10*3/uL (ref 150–400)
RBC: 4.12 MIL/uL — ABNORMAL LOW (ref 4.22–5.81)
RDW: 12.6 % (ref 11.5–15.5)
WBC: 6.4 10*3/uL (ref 4.0–10.5)
nRBC: 0 % (ref 0.0–0.2)

## 2020-11-19 LAB — LACTATE DEHYDROGENASE: LDH: 176 U/L (ref 98–192)

## 2020-11-20 ENCOUNTER — Telehealth: Payer: Self-pay | Admitting: Hematology

## 2020-11-20 ENCOUNTER — Ambulatory Visit: Payer: Medicare Other

## 2020-11-20 NOTE — Telephone Encounter (Signed)
Scheduled follow-up appointment per 7/14 los. Patient is aware.

## 2020-11-23 ENCOUNTER — Other Ambulatory Visit: Payer: Self-pay

## 2020-11-23 ENCOUNTER — Ambulatory Visit
Admission: RE | Admit: 2020-11-23 | Discharge: 2020-11-23 | Disposition: A | Discharge status: Home / Self Care | Payer: Medicare Other | Source: Ambulatory Visit | Attending: Radiation Oncology | Admitting: Radiation Oncology

## 2020-11-23 DIAGNOSIS — Z510 Encounter for antineoplastic radiation therapy: Secondary | ICD-10-CM | POA: Diagnosis not present

## 2020-11-24 ENCOUNTER — Ambulatory Visit
Admission: RE | Admit: 2020-11-24 | Discharge: 2020-11-24 | Disposition: A | Discharge status: Home / Self Care | Payer: Medicare Other | Source: Ambulatory Visit | Attending: Radiation Oncology | Admitting: Radiation Oncology

## 2020-11-24 ENCOUNTER — Ambulatory Visit: Payer: Medicare Other

## 2020-11-24 DIAGNOSIS — Z510 Encounter for antineoplastic radiation therapy: Secondary | ICD-10-CM | POA: Diagnosis not present

## 2020-11-25 ENCOUNTER — Encounter: Payer: Self-pay | Admitting: Radiation Oncology

## 2020-11-25 ENCOUNTER — Encounter: Payer: Self-pay | Admitting: Hematology

## 2020-11-25 ENCOUNTER — Ambulatory Visit
Admission: RE | Admit: 2020-11-25 | Discharge: 2020-11-25 | Disposition: A | Discharge status: Home / Self Care | Payer: Medicare Other | Source: Ambulatory Visit | Attending: Radiation Oncology | Admitting: Radiation Oncology

## 2020-11-25 ENCOUNTER — Other Ambulatory Visit: Payer: Self-pay

## 2020-11-25 DIAGNOSIS — Z510 Encounter for antineoplastic radiation therapy: Secondary | ICD-10-CM | POA: Diagnosis not present

## 2020-11-25 NOTE — Progress Notes (Signed)
Oncology Nurse Navigator Documentation   Met with Mr. Ilic after final RT to offer support and to celebrate end of radiation treatment.   Provided verbal post-RT guidance: Importance of keeping all follow-up appts, especially those with Nutrition. Importance of protecting treatment area from sun. Continuation of Sonafine application 2-3 times daily, application of antibiotic ointment to areas of raw skin; when supply of Sonafine exhausted transition to OTC lotion with vitamin E.  Explained my role as navigator will continue for several more months, encouraged him to call me with needs/concerns.    Harlow Asa RN, BSN, OCN Head & Neck Oncology Nurse [place] [facility] Phone # [phone]  Fax # [phone]

## 2020-11-26 ENCOUNTER — Telehealth: Payer: Self-pay

## 2020-11-26 NOTE — Telephone Encounter (Signed)
Spoke with patient sates he has developed blisters on his neck since starting the neosporin. Advised to stop using neosporin just use sonefine. Take Benadryl for possible skin reaction. Present to urgent care if it gets worse and I will call him on Monday to check on his condition.

## 2020-11-30 ENCOUNTER — Telehealth: Payer: Self-pay

## 2020-11-30 NOTE — Telephone Encounter (Signed)
Called patient to check on him. Patient started Aquafore and his condition has improved 100%. Swelling and peeling are gone just has the darkened skin.

## 2020-12-02 ENCOUNTER — Ambulatory Visit: Payer: 59 | Attending: Radiation Oncology

## 2020-12-03 ENCOUNTER — Other Ambulatory Visit: Payer: Self-pay

## 2020-12-03 ENCOUNTER — Ambulatory Visit: Payer: Medicare Other | Attending: Radiation Oncology

## 2020-12-03 DIAGNOSIS — M545 Low back pain, unspecified: Secondary | ICD-10-CM | POA: Diagnosis not present

## 2020-12-03 DIAGNOSIS — R262 Difficulty in walking, not elsewhere classified: Secondary | ICD-10-CM | POA: Insufficient documentation

## 2020-12-03 DIAGNOSIS — C320 Malignant neoplasm of glottis: Secondary | ICD-10-CM | POA: Insufficient documentation

## 2020-12-03 DIAGNOSIS — G8929 Other chronic pain: Secondary | ICD-10-CM | POA: Diagnosis present

## 2020-12-03 NOTE — Patient Instructions (Signed)
Pt was given illustrated instructions from [facility] but forgot to list in chart.   Ppt, ppt with hip adduction, ppt with single leg fall out, figure 4 stretch, LTR stretch, SKTC stretch.

## 2020-12-03 NOTE — Therapy (Signed)
[place], [place], [address] Phone: [phone]   Fax:  [phone]  Physical Therapy Treatment  Patient Details  Name: Jesus [name] MRN: [id number] Date of Birth: 05-Jul-1955 Referring Provider (PT): Isidore Moos   Encounter Date: 12/03/2020   PT End of Session - 12/03/20 1717     Visit Number 2    Number of Visits 9    Date for PT Re-Evaluation 01/14/21    PT Start Time 1025    PT Stop Time 1500    PT Time Calculation (min) 53 min    Activity Tolerance Patient tolerated treatment well    Behavior During Therapy [facility] for tasks assessed/performed             Past Medical History:  Diagnosis Date   Allergy    Anemia    during chemo   Arthritis    knee    Blood transfusion without reported diagnosis    Cancer ([place])    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- last radiation 06-2018   Cataract    removed both eyes with l;ens implants    Family history of colon cancer    in his brother- dx'd age 75    History of chemotherapy    last 06-01-2018   History of kidney stones    History of radiation therapy    last radiation 06-2018   Hyperlipidemia    currently under control   Hypertension    Irregular heart beats    Lymphadenopathy    Pain, lower leg    Bilateral   Peripheral arterial disease ([place])    Pre-diabetes    Red-green color blindness    RLS (restless legs syndrome)    Snores    Wears glasses     Past Surgical History:  Procedure Laterality Date   CATARACT EXTRACTION W/ INTRAOCULAR LENS  IMPLANT, BILATERAL     COLONOSCOPY     dislodged salava stone     FRACTURE SURGERY     HAND ARTHROPLASTY  1995   crushed left hand   INGUINAL LYMPH NODE BIOPSY Left 01/02/2018   Procedure: LEFT INGUINAL LYMPH NODE BIOPSY;  Surgeon: Rolm Bookbinder, MD;  Location: [place];  Service: General;  Laterality: Left;   IR IMAGING GUIDED PORT INSERTION  01/15/2018   IR  REMOVAL TUN ACCESS W/ PORT W/O FL MOD SED  03/11/2019   MICROLARYNGOSCOPY Left 01/17/2014   Procedure: MICROLARYNGOSCOPY WITH EXCISION OF THE BIOPSY OF LEFT VOCAL CORD LESION;  Surgeon: Izora Gala, MD;  Location: [place];  Service: ENT;  Laterality: Left;   MICROLARYNGOSCOPY N/A 09/16/2020   Procedure: MICROLARYNGOSCOPY with Biopsy of vocal cord lesion;  Surgeon: Jerrell Belfast, MD;  Location: [facility] OR;  Service: ENT;  Laterality: N/A;   ORIF FOOT FRACTURE  2005   left   REFRACTIVE SURGERY Right    removed cloudiness in right eye after cataract removal     There were no vitals filed for this visit.   Subjective Assessment - 12/03/20 1405     Subjective I got alot of blisters at the end of radiation.  I think it was because I used the neosporin and the sonic together.  It got better with the aquaphor, My throat still hurts to swallow but they give me lidocaine then my food tastes terrible. I quit smoking after 43 years and I feel better.    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 He started on 6/9 and will complete 7/19,  S/P 6 cycles of EPOCH-R completed on 06/01/18 and Radiation 07/25/18- 08/23/2018 to his Pelvis for Large B-cell Lymphoma, DDD    Patient Stated Goals to be able to get back in shape and exercise more, play golf, swlm, get back to being myself    Currently in Pain? Yes    Pain Score 6     Pain Location Hand    Pain Orientation Left    Pain Descriptors / Indicators Spasm;Sore    Pain Type Chronic pain    Pain Onset More than a month ago    Pain Frequency Intermittent                [facility] PT Assessment  - 12/03/20 0001       Observation/Other Assessments   Observations darkened skin at neck from radiation, but all open areas healed, but still look fragile     AROM   Right Shoulder Extension 70 Degrees    Right Shoulder Flexion 148 Degrees    Right Shoulder ABduction 165 Degrees    Left Shoulder Extension 70 Degrees    Left Shoulder Flexion 150 Degrees    Left Shoulder ABduction 170 Degrees    Cervical Flexion WFL    Cervical Extension WFL    Cervical - Right Side Bend WFL   end range tightness   Cervical - Left Side Bend WFL   end range tightness   Cervical - Right Rotation WFL    Cervical - Left Rotation WFL               LYMPHEDEMA/ONCOLOGY QUESTIONNAIRE - 12/03/20 0001       Head and Neck   4 cm superior to sternal notch around neck 44 cm    6 cm superior to sternal notch around neck 44.3 cm    8 cm superior to sternal notch around neck 46 cm                        [facility] Adult PT Treatment/Exercise - 12/03/20 0001       Lumbar Exercises: Stretches   Single Knee to Chest Stretch Right;Left;3 reps;20 seconds    Lower Trunk Rotation 5 reps;10 seconds   ea side   Figure 4 Stretch 3 reps;20 seconds;Supine;With overpressure   ea leg     Lumbar Exercises: Supine   Pelvic Tilt 10 reps;5 reps    Pelvic Tilt Limitations TC's to do correctly    Clam 10 reps   with ppt   Clam Limitations difficulty maintaining neutral spine initially    Other Supine Lumbar Exercises ppt with shoulder flexion x 10    Other Supine Lumbar Exercises ppt with hip add ball squeeze x 10                    PT Education - 12/03/20 1716     Education Details Pt was educated in ppt, ppt with ball squeeze, ppt with single leg fall out or bilateral abd, SKTC, Figure 4 stretch, LTR    Person(s) Educated Patient    Methods Explanation;Handout    Comprehension Returned  demonstration                 PT Long Term Goals - 10/22/20 1123       PT LONG TERM GOAL #1    Title Pt will return to baseline cervical ROM and not demonstrate any signs of lymphedema.    Time 12    Period Weeks    Status New    Target Date 01/14/21      PT LONG TERM GOAL #2   Title Pt will report back pain a max of 2/10 in the evening to allow improved comfort.    Baseline 7/10    Time 12    Period Weeks    Status New    Target Date 01/14/21      PT LONG TERM GOAL #3   Title Pt will report he is able to walk for longer periods of time without fatigue as he does his activities of daily living    Time 12    Period Weeks    Status New    Target Date 01/14/21      PT LONG TERM GOAL #4   Title Pt will be able to single limb stance on either leg for at least 10 seconds each to decrease fall risk    Baseline unable    Time 12    Period Weeks    Status New    Target Date 01/14/21      PT LONG TERM GOAL #5   Title Pt will report he is able to complete yard work without back pain to allow him to return to prior level of function.    Time 12    Period Weeks    Status New    Target Date 01/14/21                   Plan - 12/03/20 1718     Clinical Impression Statement Pt has completed radiation and although he experienced some blistering and peeling from combining some topical ointments it cleared up quickly with Eucerin lotion in just a few days.  He is experiencing some pain with swallowing and some coughing.  Cervical ROM and Shoulder ROM are all WFL to WNL.  THere is some end range tightness more with bilateral cervical SB.  Circumferential measuring of his neck reveals slight decreases throughout and he does not feel he is swollen.  We initiated Core strength and some flexibility exercises for his LB today.  He had no pain with exercises and by the end of the session was doing very well activating his abdominals for stabilization.  He was given a HEP but was advised to discontinue any exs that causes pain as it may mean he is not doing it correctly.    Personal  Factors and Comorbidities Fitness;Time since onset of injury/illness/exacerbation;Comorbidity 1;Comorbidity 2    Comorbidities hx of lymphoma in 2020 with chemo and radiation, PAD    Examination-Activity Limitations Lift;Stand;Stairs    Examination-Participation Restrictions Community Activity;Yard Work    [employer]chant [employer] officer Evolving/Moderate complexity    Rehab Potential Good    PT Frequency 2x / week    PT Duration 4 weeks    PT Treatment/Interventions ADLs/Self Care Home Management;Therapeutic activities;Therapeutic exercise;Neuromuscular re-education;Patient/family education;Manual techniques    PT Next Visit Plan Continue core exercises,flexibility, STM to Right LB prn    Consulted and Agree with Plan of Care Patient             Patient  will benefit from skilled therapeutic intervention in order to improve the following deficits and impairments:  Pain, Postural dysfunction, Decreased activity tolerance, Decreased knowledge of precautions, Increased muscle spasms, Difficulty walking, Decreased mobility, Decreased endurance  Visit Diagnosis: Chronic right-sided low back pain without sciatica  Difficulty in walking, not elsewhere classified  Malignant neoplasm of glottis [facility])     Problem List Patient Active Problem List   Diagnosis Date Noted   Malignant neoplasm of glottis ([place]) 10/02/2020   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   Chronic pain of right knee 10/17/2018   Large cell (diffuse) non-Hodgkin's lymphoma ([place]) 05/10/2018   Bacteremia due to Escherichia coli    HTN (hypertension) 04/17/2018   RLS (restless legs syndrome) 04/17/2018   Abnormal LFTs 11/17/1973   Acute metabolic encephalopathy 88/32/5498   AKI (acute kidney injury) ([place]) 04/16/2018   Dehydration    Fever, unspecified    Sepsis ([place])    Disorientation    Hypokalemia    Hypomagnesemia    Anemia    Encounter for antineoplastic chemotherapy    At high risk of tumor lysis syndrome     Swelling of lower leg    Diffuse large B cell lymphoma ([place]) 01/15/2018   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[place]) 01/12/2018   Counseling regarding advance care planning and goals of care 01/12/2018   Bilateral leg pain 05/27/2014    Claris Pong 12/03/2020, 5:30 PM  [place], [place], [address] Phone: [phone]   Fax:  [phone]  Name: Jesus [name] MRN: [id number] Date of Birth: 13-Jan-1956 Cheral Almas, PT 12/03/20 5:32 PM

## 2020-12-07 ENCOUNTER — Ambulatory Visit: Payer: 59

## 2020-12-07 ENCOUNTER — Telehealth: Payer: Self-pay

## 2020-12-07 NOTE — Telephone Encounter (Signed)
Nutrition Follow-up:  Patient with right vocal cord cancer.  Patient has completed radiation on 7/20.    Called patient for nutrition follow-up.  Patient reports that his appetite is good.  Reports that worse thing is cough, MD aware.  Reports painful swallowing but better.  Says that he has eaten a hot dog today and planning on eating ribs, green beans and boiled potatoes once they are finished cooking later today.  Yesterday ate bacon and eggs for breakfast.  Later during the day ate shrimp, coleslaw and mexican rice and cucumbers.    Anthropometrics:   Weight 307 lb on 11/23/20 per [facility]  310 lb on 7/14  Weight stable.  Patient says weight has been about the same.   NUTRITION DIAGNOSIS: Food and nutrition related knowledge deficit improved    INTERVENTION:  Encouraged well balanced diet including good sources of protein and weight maintenance. Patient denied questions or concerns about nutrition    MONITORING, EVALUATION, GOAL: weight trends, intake   NEXT VISIT: Phone call in ~1 month  Suyash Amory B. Zenia Resides, [place], [place] Registered Dietitian [phone] (mobile)

## 2020-12-07 NOTE — Progress Notes (Signed)
Nutrition  See phone note

## 2020-12-08 ENCOUNTER — Ambulatory Visit: Payer: Medicare Other | Attending: Radiation Oncology

## 2020-12-08 ENCOUNTER — Other Ambulatory Visit: Payer: Self-pay

## 2020-12-08 DIAGNOSIS — G8929 Other chronic pain: Secondary | ICD-10-CM | POA: Diagnosis present

## 2020-12-08 DIAGNOSIS — M545 Low back pain, unspecified: Secondary | ICD-10-CM | POA: Diagnosis present

## 2020-12-08 DIAGNOSIS — C320 Malignant neoplasm of glottis: Secondary | ICD-10-CM | POA: Insufficient documentation

## 2020-12-08 DIAGNOSIS — R262 Difficulty in walking, not elsewhere classified: Secondary | ICD-10-CM | POA: Insufficient documentation

## 2020-12-08 NOTE — Therapy (Signed)
[place], [place], [address] Phone: [phone]   Fax:  [phone]  Physical Therapy Treatment  Patient Details  Name: Jesus [name] MRN: [id number] Date of Birth: 06-17-1955 Referring Provider (PT): Isidore Moos   Encounter Date: 12/08/2020   PT End of Session - 12/08/20 1216     Visit Number 3    Number of Visits 9    Date for PT Re-Evaluation 01/14/21    PT Start Time 1106    PT Stop Time 1200    PT Time Calculation (min) 54 min    Activity Tolerance Patient tolerated treatment well    Behavior During Therapy [facility] for tasks assessed/performed             Past Medical History:  Diagnosis Date   Allergy    Anemia    during chemo   Arthritis    knee    Blood transfusion without reported diagnosis    Cancer ([place])    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- last radiation 06-2018   Cataract    removed both eyes with l;ens implants    Family history of colon cancer    in his brother- dx'd age 85    History of chemotherapy    last 06-01-2018   History of kidney stones    History of radiation therapy    last radiation 06-2018   Hyperlipidemia    currently under control   Hypertension    Irregular heart beats    Lymphadenopathy    Pain, lower leg    Bilateral   Peripheral arterial disease ([place])    Pre-diabetes    Red-green color blindness    RLS (restless legs syndrome)    Snores    Wears glasses     Past Surgical History:  Procedure Laterality Date   CATARACT EXTRACTION W/ INTRAOCULAR LENS  IMPLANT, BILATERAL     COLONOSCOPY     dislodged salava stone     FRACTURE SURGERY     HAND ARTHROPLASTY  1995   crushed left hand   INGUINAL LYMPH NODE BIOPSY Left 01/02/2018   Procedure: LEFT INGUINAL LYMPH NODE BIOPSY;  Surgeon: Rolm Bookbinder, MD;  Location: [place];  Service: General;  Laterality: Left;   IR IMAGING GUIDED PORT INSERTION  01/15/2018   IR  REMOVAL TUN ACCESS W/ PORT W/O FL MOD SED  03/11/2019   MICROLARYNGOSCOPY Left 01/17/2014   Procedure: MICROLARYNGOSCOPY WITH EXCISION OF THE BIOPSY OF LEFT VOCAL CORD LESION;  Surgeon: Izora Gala, MD;  Location: [place];  Service: ENT;  Laterality: Left;   MICROLARYNGOSCOPY N/A 09/16/2020   Procedure: MICROLARYNGOSCOPY with Biopsy of vocal cord lesion;  Surgeon: Jerrell Belfast, MD;  Location: [facility] OR;  Service: ENT;  Laterality: N/A;   ORIF FOOT FRACTURE  2005   left   REFRACTIVE SURGERY Right    removed cloudiness in right eye after cataract removal     There were no vitals filed for this visit.   Subjective Assessment - 12/08/20 1109     Subjective My back has been doing alot better in the past week. I've stopped sleeping on the couch and I know that's helping. I'm also able to be more upright and have better posture now that I've healed from radiation.    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 He started on 6/9 and will complete 7/19,  S/P 6 cycles of EPOCH-R completed on 06/01/18 and Radiation 07/25/18- 08/23/2018 to his Pelvis for Large B-cell Lymphoma, DDD    Patient Stated Goals to be able to get back in shape and exercise more, play golf, swlm, get back to being myself    Currently in Pain? No/denies                               [facility] Adult PT Treatment/Exercise - 12/08/20 0001       Lumbar Exercises: Stretches   Active Hamstring Stretch Right;Left;3 reps;20 seconds   supine holding behind knee   Passive Hamstring Stretch Right;Left;2 reps;20 seconds   seated edge of chair after doing TM at  end of session   Single Knee to Chest Stretch Right;Left;3 reps;20 seconds    Lower Trunk Rotation 3 reps;10 seconds      Lumbar Exercises: Aerobic   Tread Mill Incline 2, 1.7 mph x3:13 mins      Lumbar Exercises: Supine   Pelvic Tilt 10 reps;Other (comment)    Pelvic Tilt Limitations Tactile and VCs for correct technique and to    Clam Other (comment)   2 x 5 reps   Clam Limitations Tactile and Vcs to hold pelvic tilt as this is challenging for pt    Bent Knee Raise 10 reps    Bent Knee Raise Limitations VCs to hold pelvic tilt throughout    Bridge 10 reps   ball squeeze     Knee/Hip Exercises: Standing   Hip Flexion Stengthening;Right;Left;2 sets;10 reps   2# each ankle at counter   Hip Flexion Limitations pt returnedd therapist demo for each 3 way hip raise    Hip Abduction Stengthening;Right;Left;2 sets;10 reps   2# each ankle at counter   Abduction Limitations VCs and demo to decrease hip er    Hip Extension Stengthening;Right;Left;2 sets;10 reps   2# each ankle at counter   Extension Limitations Pt returned correct demo                         PT Long Term Goals - 10/22/20 1123       PT LONG TERM GOAL #1   Title Pt will return to baseline cervical ROM and not demonstrate any signs of lymphedema.    Time 12    Period Weeks    Status New    Target Date 01/14/21      PT LONG TERM GOAL #2   Title Pt will report back pain a max of 2/10 in the evening to allow improved comfort.    Baseline 7/10    Time 12    Period Weeks    Status New    Target Date 01/14/21      PT LONG TERM GOAL #3   Title Pt will report he is able to walk for longer periods of time without fatigue as he does his activities of daily living    Time 12    Period Weeks    Status New    Target Date 01/14/21      PT LONG TERM GOAL #4   Title Pt will be able to  single limb stance on either leg for at least 10 seconds each to decrease fall risk    Baseline unable    Time 12    Period  Weeks    Status New    Target Date 01/14/21      PT LONG TERM GOAL #5   Title Pt will report he is able to complete yard work without back pain to allow him to return to prior level of function.    Time 12    Period Weeks    Status New    Target Date 01/14/21                   Plan - 12/08/20 1222     Clinical Impression Statement Pt reports his LBP is much improved since evaluation. His biggest complaint now is weakness in his bil LE's which he says has been ongoing for years but worsened in past few months. So continued with core strength, which was a challenge for pt due to compensations, and low back flexibility he is still very tight here. And then progressed him to include bil LE strength and endurance. He reports feeling challenged by todays session but not overly so. Encouraged him to work on a daily walking routine, or at least a 3 days a week to start.    Personal Factors and Comorbidities Fitness;Time since onset of injury/illness/exacerbation;Comorbidity 1;Comorbidity 2    Comorbidities hx of lymphoma in 2020 with chemo and radiation, PAD    Examination-Activity Limitations Lift;Stand;Stairs    Examination-Participation Restrictions Community Activity;Yard Work    [employer] officer Evolving/Moderate complexity    Rehab Potential Good    PT Frequency 2x / week    PT Duration 4 weeks    PT Treatment/Interventions ADLs/Self Care Home Management;Therapeutic activities;Therapeutic exercise;Neuromuscular re-education;Patient/family education;Manual techniques    PT Next Visit Plan Continue core exercises,flexibility, bil LE strength (add these to HEP) and endurance (TM)    PT Home Exercise Plan head and neck ROM exercises    Consulted and Agree with Plan of Care Patient             Patient will benefit from skilled therapeutic intervention in order to improve the following deficits and impairments:  Pain, Postural dysfunction, Decreased activity  tolerance, Decreased knowledge of precautions, Increased muscle spasms, Difficulty walking, Decreased mobility, Decreased endurance  Visit Diagnosis: Chronic right-sided low back pain without sciatica  Difficulty in walking, not elsewhere classified  Malignant neoplasm of glottis [facility])     Problem List Patient Active Problem List   Diagnosis Date Noted   Malignant neoplasm of glottis ([place]) 10/02/2020   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   Chronic pain of right knee 10/17/2018   Large cell (diffuse) non-Hodgkin's lymphoma ([place]) 05/10/2018   Bacteremia due to Escherichia coli    HTN (hypertension) 04/17/2018   RLS (restless legs syndrome) 04/17/2018   Abnormal LFTs 46/50/3546   Acute metabolic encephalopathy 56/81/2751   AKI (acute kidney injury) ([place]) 04/16/2018   Dehydration    Fever, unspecified    Sepsis ([place])    Disorientation    Hypokalemia    Hypomagnesemia    Anemia    Encounter for antineoplastic chemotherapy    At high risk of tumor lysis syndrome    Swelling of lower leg    Diffuse large B cell lymphoma ([place]) 01/15/2018   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[place]) 01/12/2018   Counseling regarding advance care planning and goals of care  01/12/2018   Bilateral leg pain 05/27/2014    Otelia Limes, PTA 12/08/2020, 12:32 PM  [place], [place], [address] Phone: [phone]   Fax:  [phone]  Name: VIPUL CAFARELLI MRN: [id number] Date of Birth: 1955/06/05

## 2020-12-10 ENCOUNTER — Ambulatory Visit: Payer: Medicare Other

## 2020-12-15 ENCOUNTER — Other Ambulatory Visit: Payer: Self-pay

## 2020-12-15 ENCOUNTER — Ambulatory Visit: Payer: Medicare Other

## 2020-12-15 DIAGNOSIS — C320 Malignant neoplasm of glottis: Secondary | ICD-10-CM

## 2020-12-15 DIAGNOSIS — M545 Low back pain, unspecified: Secondary | ICD-10-CM | POA: Diagnosis not present

## 2020-12-15 DIAGNOSIS — R262 Difficulty in walking, not elsewhere classified: Secondary | ICD-10-CM

## 2020-12-15 DIAGNOSIS — G8929 Other chronic pain: Secondary | ICD-10-CM

## 2020-12-15 NOTE — Patient Instructions (Addendum)
Heel Raise: Bilateral (Standing)   Cancer Rehab [phone]    Stand near counter for fingertip support if needed. Rise on balls of feet. Once this becomes easier, try single leg. Repeat __10-20__ times per set. Do _1-2___ sets per session. Do __2__ sessions per day.  SINGLE LIMB STANCE    Stand at counter (corner if you have one) for minimal arm support. Raise leg. Hold _10-20__ seconds. Repeat with other leg. Once this becomes easier, for increased challenge, close eyes with fingertips on counter for safety. _3-5__ reps per set, _2-3__ sets per day.  Tandem Stance    Stand at counter (corner if you have one). Right foot in front of left, heel touching toe both feet "straight ahead". Stand on Foot Triangle of Support with both feet. Balance in this position _10-20__ seconds. Do with left foot in front of right. Once this becomes easier, for increased challenge, close eyes with fingertips on counter for safety.  Step-Up: Forward    Move step-stool to counter or hold rail if at steps, but as little as able. Step up forward keeping opposite foot off step. Keep pelvis level and back straight. Hold the single leg stand for 3 seconds, then come back down slowly.  Do _10__ times, do _1-2_ sets, on each leg, _1-2__ times per day.         HIP: Flexion Standing    Holding onto counter lift leg straight in front keeping knee straight. Slow and controlled! _10__ reps per set, _2-3__ sets per day. Then repeat with other leg.   Hip Extension (Standing)    Stand with support at counter. Squeeze pelvic floor and hold so as not to twist hips and don't lean forward.  Move right leg backward with straight knee. Slow and controlled! Repeat _10__ times. Do _2-3__ times a day. Repeat with other leg.  Hip Abduction (Standing)    Stand with support. Squeeze pelvic floor and hold. Lift right leg out to side, keeping toe forward.  Repeat _10__ times. Do _2-3__ times a day. Repeat with other  leg.   Half Squat to Chair    Stand with feet shoulder width apart. Push buttocks backward and lower slowly, touching chair lightly and returning to standing position. Come all the way to standing, squeezing buttocks and abdominals. Complete _1-2__ sets of __10_ repetitions. Perform __2-3_ sessions per day.

## 2020-12-15 NOTE — Therapy (Signed)
[place], [place], [address] Phone: [phone]   Fax:  [phone]  Physical Therapy Treatment  Patient Details  Name: Jesus [name] MRN: [id number] Date of Birth: [birth year] Referring Provider (PT): Jesus [name]   Encounter Date: 12/15/2020   PT End of Session - 12/15/20 1153     Visit Number 4    Number of Visits 9    Date for PT Re-Evaluation 01/14/21    PT Start Time 1324    PT Stop Time 1142    PT Time Calculation (min) 40 min    Activity Tolerance Patient tolerated treatment well    Behavior During Therapy [name] for tasks assessed/performed             Past Medical History:  Diagnosis Date   Allergy    Anemia    during chemo   Arthritis    knee    Blood transfusion without reported diagnosis    Cancer ([place])    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- last radiation 06-2018   Cataract    removed both eyes with l;ens implants    Family history of colon cancer    in his brother- dx'd age 32    History of chemotherapy    last 06-01-2018   History of kidney stones    History of radiation therapy    last radiation 06-2018   Hyperlipidemia    currently under control   Hypertension    Irregular heart beats    Lymphadenopathy    Pain, lower leg    Bilateral   Peripheral arterial disease ([place])    Pre-diabetes    Red-green color blindness    RLS (restless legs syndrome)    Snores    Wears glasses     Past Surgical History:  Procedure Laterality Date   CATARACT EXTRACTION W/ INTRAOCULAR LENS  IMPLANT, BILATERAL     COLONOSCOPY     dislodged salava stone     FRACTURE SURGERY     HAND ARTHROPLASTY  1995   crushed left hand   INGUINAL LYMPH NODE BIOPSY Left 01/02/2018   Procedure: LEFT INGUINAL LYMPH NODE BIOPSY;  Surgeon: Rolm Bookbinder, MD;  Location: [place];  Service: General;  Laterality: Left;   IR IMAGING GUIDED PORT INSERTION  01/15/2018   IR  REMOVAL TUN ACCESS W/ PORT W/O FL MOD SED  03/11/2019   MICROLARYNGOSCOPY Left 01/17/2014   Procedure: MICROLARYNGOSCOPY WITH EXCISION OF THE BIOPSY OF LEFT VOCAL CORD LESION;  Surgeon: Izora Gala, MD;  Location: [place];  Service: ENT;  Laterality: Left;   MICROLARYNGOSCOPY N/A 09/16/2020   Procedure: MICROLARYNGOSCOPY with Biopsy of vocal cord lesion;  Surgeon: Jerrell Belfast, MD;  Location: [facility] OR;  Service: ENT;  Laterality: N/A;   ORIF FOOT FRACTURE  2005   left   REFRACTIVE SURGERY Right    removed cloudiness in right eye after cataract removal     There were no vitals filed for this visit.   Subjective Assessment - 12/15/20 1106     Subjective I felt fine after last session. I did a couple of the exercises but not like I should have. Overall I am feeling alot better than I was a few weeks ago when I started coming here. Quitting smoking has helped alot too I know. I think I don't need to come to physical therapy much longer.    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 He started on 6/9 and will complete 7/19,  S/P 6 cycles of EPOCH-R completed on 06/01/18 and Radiation 07/25/18- 08/23/2018 to his Pelvis for Large B-cell Lymphoma, DDD    Patient Stated Goals to be able to get back in shape and exercise more, play golf, swlm, get back to being myself    Currently in Pain? No/denies                               [facility] Adult PT Treatment/Exercise - 12/15/20 0001       Self-Care   Self-Care Other Self-Care Comments    Other Self-Care Comments  Spent beginning of session reviewing with pt his current  functional status and assessing progress towards goals. Also spent time encouraging him to begin a daily walking routine, especially since he has a trip to [place] coming up in Oct and plans to hike some during this.      Neuro Re-ed    Neuro Re-ed Details  Standing at the counter: Bil SLS practice, bil tandem stance practice both with eyes open and closed and fingertips on the counter throughout; bil heel raises then tried single leg heel raise; then alt 6" stpe ups with SLS on step for each rep with fingertips on counter; pt was challneged by all activities and he reports niticing he needs to focus on these as his balance is more off than he realized.      Knee/Hip Exercises: Standing   Hip Flexion Stengthening;Right;Left;5 reps    Hip Flexion Limitations all 3 hip raises done at counter with fingertip support    Hip Abduction Stengthening;Right;Left;5 reps    Hip Extension Stengthening;Right;Left;5 reps    Functional Squat 5 reps    Functional Squat Limitations in front of mat table and cuing for full extension at end of motion                    PT Education - 12/15/20 1141     Education Details Balance and bil LE strength    Person(s) Educated Patient    Methods Explanation;Demonstration;Handout    Comprehension Verbalized understanding;Returned demonstration                 PT Long Term Goals - 12/15/20 1109       PT LONG TERM GOAL #1   Title Pt will return to baseline cervical ROM and not demonstrate any signs of lymphedema.    Baseline Cervical A/ROM all WNLs - 12/15/2020    Status Achieved      PT LONG TERM GOAL #2   Title Pt will report back pain a max of 2/10 in the evening to allow improved comfort.    Baseline 7/10;No pain now, just some tightness after longer activities like mowing-12/15/2020    Status Achieved      PT LONG TERM GOAL #3   Title Pt will report he is able to walk for longer periods of time without fatigue as he does his activities of  daily living    Baseline Fatigue much improved, no issues iwth ADLs but haven't tried walking around the  block yet-12/15/2020    Status Partially Met      PT LONG TERM GOAL #4   Title Pt will be able to single limb stance on either leg for at least 10 seconds each to decrease fall risk    Baseline unable; Rt LE 10 sec, Lt LE 2-3 sec - 12/15/2020    Status Partially Met      PT LONG TERM GOAL #5   Title Pt will report he is able to complete yard work without back pain to allow him to return to prior level of function.    Baseline Pt able to do this now with only reports of tightness after and wan't limited in acitvity-12/15/2020    Status Achieved                   Plan - 12/15/20 1154     Clinical Impression Statement Pt has overall made excellent progress towards goals at this time and biggest complaint at this time is balance feeling off at times. He has yet to starta walking routine so encouraged him in importance of this, especially with trip with hiking upcoming in Oct. Pt verbalized good understanding of this and agreed to working on this. Progressed his HEP to address balance deficits and bil LE strength which will help improve balance as well. Pt would like to work on independence with HEP and return for a final assess in about 3 weeks so this was scheduled before he left today.    Personal Factors and Comorbidities Fitness;Time since onset of injury/illness/exacerbation;Comorbidity 1;Comorbidity 2    Comorbidities hx of lymphoma in 2020 with chemo and radiation, PAD    Examination-Activity Limitations Lift;Stand;Stairs    Examination-Participation Restrictions Community Activity;Yard Work    [employer] Evolving/Moderate complexity    Rehab Potential Good    PT Frequency 2x / week    PT Duration 4 weeks    PT Treatment/Interventions ADLs/Self Care Home Management;Therapeutic activities;Therapeutic exercise;Neuromuscular re-education;Patient/family  education;Manual techniques    PT Next Visit Plan Reassess SLS goal; how is pt doing with HEP/daily walking program? Need to progress HEP? Ready for D/C?    PT Home Exercise Plan head and neck ROM exercises; balance and bil LE strength    Consulted and Agree with Plan of Care Patient             Patient will benefit from skilled therapeutic intervention in order to improve the following deficits and impairments:  Pain, Postural dysfunction, Decreased activity tolerance, Decreased knowledge of precautions, Increased muscle spasms, Difficulty walking, Decreased mobility, Decreased endurance  Visit Diagnosis: Chronic right-sided low back pain without sciatica  Difficulty in walking, not elsewhere classified  Malignant neoplasm of glottis [facility])     Problem List Patient Active Problem List   Diagnosis Date Noted   Malignant neoplasm of glottis ([place]) 10/02/2020   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   Chronic pain of right knee 10/17/2018   Large cell (diffuse) non-Hodgkin's lymphoma ([place]) 05/10/2018   Bacteremia due to Escherichia coli    HTN (hypertension) 04/17/2018   RLS (restless legs syndrome) 04/17/2018   Abnormal LFTs 71/69/6789   Acute metabolic encephalopathy 38/02/1750   AKI (acute kidney injury) ([place]) 04/16/2018   Dehydration    Fever, unspecified    Sepsis ([place])    Disorientation    Hypokalemia    Hypomagnesemia    Anemia    Encounter for antineoplastic chemotherapy    At high risk of tumor lysis syndrome  Swelling of lower leg    Diffuse large B cell lymphoma (HCC) 01/15/2018   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[place]) 01/12/2018   Counseling regarding advance care planning and goals of care 01/12/2018   Bilateral leg pain 05/27/2014    Otelia Limes, PTA 12/15/2020, 12:02 PM  [place], [place], [address] Phone: [phone]   Fax:   [phone]  Name: Jesus [name] MRN: [id number] Date of Birth: December 03, 1955

## 2020-12-16 ENCOUNTER — Telehealth: Payer: Self-pay | Admitting: *Deleted

## 2020-12-16 NOTE — Telephone Encounter (Signed)
CALLED PATIENT TO INFORM OF FU WITH DR. Isidore Moos ON 12-21-20 @ 2 PM, SPOKE WITH PATIENT AND HE IS AWARE OF THIS APPT.

## 2020-12-18 ENCOUNTER — Ambulatory Visit: Payer: 59 | Admitting: Radiation Oncology

## 2020-12-21 ENCOUNTER — Other Ambulatory Visit: Payer: Self-pay

## 2020-12-21 ENCOUNTER — Ambulatory Visit
Admission: RE | Admit: 2020-12-21 | Discharge: 2020-12-21 | Disposition: A | Discharge status: Home / Self Care | Payer: Medicare Other | Source: Ambulatory Visit | Attending: Radiation Oncology | Admitting: Radiation Oncology

## 2020-12-21 ENCOUNTER — Encounter: Payer: Self-pay | Admitting: Hematology

## 2020-12-21 VITALS — BP 154/84 | HR 80 | Temp 98.5°F | Resp 19 | Wt 312.1 lb

## 2020-12-21 DIAGNOSIS — C320 Malignant neoplasm of glottis: Secondary | ICD-10-CM | POA: Diagnosis present

## 2020-12-21 DIAGNOSIS — Z7982 Long term (current) use of aspirin: Secondary | ICD-10-CM | POA: Diagnosis not present

## 2020-12-21 DIAGNOSIS — Z79899 Other long term (current) drug therapy: Secondary | ICD-10-CM | POA: Insufficient documentation

## 2020-12-21 DIAGNOSIS — Z923 Personal history of irradiation: Secondary | ICD-10-CM | POA: Diagnosis not present

## 2020-12-21 DIAGNOSIS — C8338 Diffuse large B-cell lymphoma, lymph nodes of multiple sites: Secondary | ICD-10-CM | POA: Insufficient documentation

## 2020-12-21 NOTE — Progress Notes (Signed)
[name] presents for follow-up after completing radiation to his larynx on 11/25/2020  Pain issues, if any: Patient denies Using a feeding tube?: N/A Weight changes, if any:  Wt Readings from Last 3 Encounters:  12/21/20 (!) 312 lb 2 oz (141.6 kg)  11/19/20 (!) 310 lb 8 oz (140.8 kg)  10/02/20 (!) 310 lb 2 oz (140.7 kg)   Swallowing issues, if any: Overall, patient denies. Reports medication, smaller/dryer foods, and phlegm often feel like they get stuck in his throat which causes him to cough frequently. He plans to reach out to Garald Balding for an SLP evaluation Smoking or chewing tobacco? None Using fluoride trays daily? N/A--denies any oral or dental concerns Last ENT visit was on: Not since diagnosis Other notable issues, if any: Continues to deal with vocal hoarseness, but denies any concern with dry mouth or thick saliva. Reports he had intense right sided rear pain last Friday, but states it resolved after his PCP was able to clear out the ear was that had built up in his ear canal. Reports regular bowel movements. Reports the right side of his face/neck felt slightly swollen over the weekend, but that has since resolved. Other then the frequent coughing, he reports he feels well and is pleased with his progress thus far  Vitals:   12/21/20 1405  BP: (!) 154/84  Pulse: 80  Resp: 19  Temp: 98.5 F (36.9 C)  SpO2: 100%

## 2020-12-21 NOTE — Progress Notes (Signed)
Oncology Nurse Navigator Documentation   I met with [name] today before his scheduled follow up with Dr. Isidore [name]. He is feeling well other than occasional coughing when he eats. I discussed his recent missed appointment with Garald Balding SLP and encouraged him to call him to schedule a follow up appointment. I provided [name] with [place] business card with the phone number to call for appointment. He voiced that he would call and also knows that he can call me for any future questions or concerns.   Harlow Asa RN, BSN, OCN Head & Neck Oncology Nurse [place] at [facility] Phone # [phone]  Fax # [phone]

## 2020-12-21 NOTE — Progress Notes (Signed)
Oncology Nurse Navigator Documentation   Per patient's 12/21/20 post-treatment follow-up with Dr. Isidore Moos, sent fax to [facility] ENT Scheduling with request Mr. Heckmann be contacted and scheduled for routine post-RT follow-up in 3 months with Dr. Wilburn Cornelia.  Notification of successful fax transmission received.   Harlow Asa RN, BSN, OCN Head & Neck Oncology Nurse [place] at [facility] Phone # [phone]  Fax # [phone]

## 2020-12-23 ENCOUNTER — Encounter: Payer: Self-pay | Admitting: Hematology

## 2020-12-23 ENCOUNTER — Encounter: Payer: Self-pay | Admitting: Radiation Oncology

## 2020-12-23 NOTE — Progress Notes (Signed)
Radiation Oncology         [phone] ________________________________  Name: Jesus [name] MRN: [id number]  Date: 12/21/2020  DOB: Sep 18, 1955  Follow-Up Visit Note  CC: Jesus Frees, MD  Jesus Frees, MD  Diagnosis and Prior Radiotherapy:       ICD-10-CM   1. 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HCC)  C83.38     2. Malignant neoplasm of glottis (HCC)  C32.0      Cancer Staging Diffuse large B-cell lymphoma of lymph nodes of multiple regions [facility]) Staging form: Hodgkin and Non-Hodgkin Lymphoma, AJCC 8th Edition - Clinical: Stage IV - Signed by Eppie Gibson, MD on 07/13/2018  Malignant neoplasm of glottis [facility]) Staging form: Larynx - Glottis, AJCC 8th Edition - Clinical stage from 10/02/2020: Stage I (cT1a, cN0, cM0) - Signed by Eppie Gibson, MD on 10/02/2020 Stage prefix: Initial diagnosis  Radiation Treatment Dates: 10/15/2020 through 11/25/2020 Site Technique Total Dose (Gy) Dose per Fx (Gy) Completed Fx Beam Energies  Larynx: HN_larynx 3D 63/63 2.25 28/28 6X   CHIEF COMPLAINT:  Here for follow-up and surveillance of throat cancer  Narrative:  The patient returns today for routine follow-up.  Jesus [name] presents for follow-up after completing radiation to his larynx on 11/25/2020  Pain issues, if any: Patient denies Using a feeding tube?: N/A Weight changes, if any:  Wt Readings from Last 3 Encounters:  12/21/20 (!) 312 lb 2 oz (141.6 kg)  11/19/20 (!) 310 lb 8 oz (140.8 kg)  10/02/20 (!) 310 lb 2 oz (140.7 kg)   Swallowing issues, if any: Overall, patient denies. Reports medication, smaller/dryer foods, and phlegm often feel like they get stuck in his throat which causes him to cough frequently. He plans to reach out to Garald Balding for an SLP evaluation Smoking or chewing tobacco? None Using fluoride trays daily? N/A--denies any oral or dental concerns Last ENT visit was on: Not since diagnosis Other notable issues, if any:  Continues to deal with vocal hoarseness, but denies any concern with dry mouth or thick saliva. Reports he had intense right sided rear pain last Friday, but states it resolved after his PCP was able to clear out the ear was that had built up in his ear canal. Reports regular bowel movements. Reports the right side of his face/neck felt slightly swollen over the weekend, but that has since resolved. Other then the frequent coughing, he reports he feels well and is pleased with his progress thus far  Vitals:   12/21/20 1405  BP: (!) 154/84  Pulse: 80  Resp: 19  Temp: 98.5 F (36.9 C)  SpO2: 100%                        ALLERGIES:  is allergic to bee venom, antifungal [miconazole nitrate], and zolpidem tartrate er.  Meds: Current Outpatient Medications  Medication Sig Dispense Refill   amLODipine (NORVASC) 10 MG tablet TAKE 1 TABLET BY MOUTH EVERY DAY (Patient taking differently: Take 10 mg by mouth every evening.) 30 tablet 0   aspirin EC 81 MG tablet Take 81 mg by mouth every evening. Swallow whole.     lidocaine (XYLOCAINE) 2 % solution Patient: Mix 1part 2% viscous lidocaine, 1part H20. Swallow 58m of diluted mixture, 325m before meals and at bedtime, up to QID. 200 mL 3   lisinopril (ZESTRIL) 20 MG tablet TAKE 1 TABLET BY MOUTH EVERY DAY (Patient taking differently: Take 20 mg by mouth  every evening.) 30 tablet 0   Multiple Vitamin (MULTIVITAMIN WITH MINERALS) TABS tablet Take 1 tablet by mouth every evening.     Multiple Vitamins-Minerals (MULTI ADULT GUMMIES PO) Take 1 tablet by mouth in the morning. Centrum     Nutritional Supplements (FRUIT & VEGETABLE DAILY PO) Take 4 tablets by mouth daily in the afternoon.     oxyCODONE-acetaminophen (PERCOCET/ROXICET) 5-325 MG tablet Take 1 tablet by mouth every 4 (four) hours as needed for severe pain. 30 tablet 0   tadalafil (CIALIS) 20 MG tablet Take 20 mg by mouth daily as needed for erectile dysfunction.     vitamin B-12 (CYANOCOBALAMIN)  1000 MCG tablet Take 1,000 mcg by mouth every evening.     No current facility-administered medications for this encounter.    Physical Findings: The patient is in no acute distress. Patient is alert and oriented. Wt Readings from Last 3 Encounters:  12/21/20 (!) 312 lb 2 oz (141.6 kg)  11/19/20 (!) 310 lb 8 oz (140.8 kg)  10/02/20 (!) 310 lb 2 oz (140.7 kg)    weight is 312 lb 2 oz (141.6 kg) (abnormal). His oral temperature is 98.5 F (36.9 C). His blood pressure is 154/84 (abnormal) and his pulse is 80. His respiration is 19 and oxygen saturation is 100%. .  General: Alert and oriented, in no acute distress HEENT: Head is normocephalic. Extraocular movements are intact. Oropharynx is notable for no lesions in upper throat or mouth.  Hoarseness has improved Neck: Neck is notable for residual hyperpigmentation over the neck, but his skin is smooth and intact Skin: Skin in treatment fields shows satisfactory healing as above Psychiatric: Judgment and insight are intact. Affect is appropriate.   Lab Findings: Lab Results  Component Value Date   WBC 6.4 11/19/2020   HGB 12.2 (L) 11/19/2020   HCT 36.3 (L) 11/19/2020   MCV 88.1 11/19/2020   PLT 317 11/19/2020    Lab Results  Component Value Date   TSH 0.564 10/07/2020    Radiographic Findings: No results found.  Impression/Plan:    1) Head and Neck Cancer Status: Healing well from radiation therapy to the larynx for early stage glottic cancer  2) Nutritional Status: Stable Wt Readings from Last 3 Encounters:  12/21/20 (!) 312 lb 2 oz (141.6 kg)  11/19/20 (!) 310 lb 8 oz (140.8 kg)  10/02/20 (!) 310 lb 2 oz (140.7 kg)    PEG tube: None  3) Risk Factors: The patient has been educated about risk factors including alcohol and tobacco abuse; they understand that avoidance of alcohol and tobacco is important to prevent recurrences as well as other cancers  4) Swallowing: He has been having issues with dysphagia.  Patient  was given contact information to get back in touch with speech-language pathology today.  Patient advised to schedule appointment with Garald Balding  5)  Thyroid function: Check annually with PCP or here in cancer center Lab Results  Component Value Date   TSH 0.564 10/07/2020    6) Other: Continue physical therapy as scheduled and continue to apply healing lotion to skin in radiation treatment fields  7) Follow-up with me in January. The patient was encouraged to call with any issues or questions before then. Anderson Malta RN sent fax to [facility] ENT Scheduling with request Mr. Ikard be contacted and scheduled for routine post-RT follow-up with Dr. Wilburn Cornelia.  Notification of successful fax transmission received.     On date of service, in total, I spent 20 minutes  on this encounter. Patient was seen in person. _____________________________________   Eppie Gibson, MD

## 2020-12-23 NOTE — Progress Notes (Signed)
                                                                                                                                                             Patient Name: Jesus [name] MRN: [id number] DOB: 11-Oct-1955 Referring Physician: Shirline Frees (Profile Not Attached) Date of Service: 11/25/2020 [facility], [place]                                                        End Of Treatment Note  Diagnoses: C32.0-Malignant neoplasm of glottis  Cancer Staging: Cancer Staging Diffuse large B-cell lymphoma of lymph nodes of multiple regions [facility]) Staging form: Hodgkin and Non-Hodgkin Lymphoma, AJCC 8th Edition - Clinical: Stage IV - Signed by Eppie Gibson, MD on 07/13/2018  Malignant neoplasm of glottis [facility]) Staging form: Larynx - Glottis, AJCC 8th Edition - Clinical stage from 10/02/2020: Stage I (cT1a, cN0, cM0) - Signed by Eppie Gibson, MD on 10/02/2020 Stage prefix: Initial diagnosis  Intent: Curative  Radiation Treatment Dates: 10/15/2020 through 11/25/2020 Site Technique Total Dose (Gy) Dose per Fx (Gy) Completed Fx Beam Energies  Larynx: HN_larynx 3D 63/63 2.25 28/28 6X   Narrative: The patient tolerated radiation therapy relatively well.   Plan: The patient will follow-up with radiation oncology in 3 wks. -----------------------------------  Eppie Gibson, MD

## 2021-01-04 ENCOUNTER — Ambulatory Visit: Payer: Medicare Other

## 2021-01-04 ENCOUNTER — Telehealth: Payer: Self-pay

## 2021-01-04 NOTE — Telephone Encounter (Signed)
Nutrition  Called patient for nutrition follow-up.  No answer. Left message with call back number  Stephie Xu B. Zenia Resides, [place], [place] Registered Dietitian [phone] (mobile)

## 2021-01-13 ENCOUNTER — Telehealth: Payer: Self-pay

## 2021-01-13 NOTE — Telephone Encounter (Signed)
Nutrition  Second attempt to reach patient for nutrition follow-up.  No answer.  Left message with call back number.  Girtrude Enslin B. Zenia Resides, [place], [place] Registered Dietitian [phone] (mobile)

## 2021-01-25 ENCOUNTER — Ambulatory Visit: Payer: Medicare Other | Attending: Radiation Oncology

## 2021-01-25 ENCOUNTER — Other Ambulatory Visit: Payer: Self-pay

## 2021-01-25 DIAGNOSIS — R262 Difficulty in walking, not elsewhere classified: Secondary | ICD-10-CM | POA: Diagnosis present

## 2021-01-25 DIAGNOSIS — C320 Malignant neoplasm of glottis: Secondary | ICD-10-CM | POA: Diagnosis present

## 2021-01-25 DIAGNOSIS — G8929 Other chronic pain: Secondary | ICD-10-CM | POA: Diagnosis present

## 2021-01-25 DIAGNOSIS — M545 Low back pain, unspecified: Secondary | ICD-10-CM | POA: Insufficient documentation

## 2021-01-25 NOTE — Therapy (Signed)
[place], [place], [address] Phone: [phone]   Fax:  [phone]  Physical Therapy Treatment  Patient Details  Name: Jesus [name] Jesus [name] Date of Birth: [birth year] Referring Provider (PT): Isidore Moos   Encounter Date: 01/25/2021   PT End of Session - 01/25/21 1536     Visit Number 5    Number of Visits 13    Date for PT Re-Evaluation 02/22/21    PT Start Time 5638    PT Stop Time 1533   pt needs to call MD about symptoms so did not treat today   PT Time Calculation (min) 28 min    Activity Tolerance Patient tolerated treatment well    Behavior During Therapy [facility] for tasks assessed/performed             Past Medical History:  Diagnosis Date   Allergy    Anemia    during chemo   Arthritis    knee    Blood transfusion without reported diagnosis    Cancer ([place])    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- last radiation 06-2018   Cataract    removed both eyes with l;ens implants    Family history of colon cancer    in his brother- dx'd age 31    History of chemotherapy    last 06-01-2018   History of kidney stones    History of radiation therapy    last radiation 06-2018   Hyperlipidemia    currently under control   Hypertension    Irregular heart beats    Lymphadenopathy    Pain, lower leg    Bilateral   Peripheral arterial disease ([place])    Pre-diabetes    Red-green color blindness    RLS (restless legs syndrome)    Snores    Wears glasses     Past Surgical History:  Procedure Laterality Date   CATARACT EXTRACTION W/ INTRAOCULAR LENS  IMPLANT, BILATERAL     COLONOSCOPY     dislodged salava stone     FRACTURE SURGERY     HAND ARTHROPLASTY  1995   crushed left hand   INGUINAL LYMPH NODE BIOPSY Left 01/02/2018   Procedure: LEFT INGUINAL LYMPH NODE BIOPSY;  Surgeon: Rolm Bookbinder, MD;  Location: [facility];  Service: General;   Laterality: Left;   IR IMAGING GUIDED PORT INSERTION  01/15/2018   IR REMOVAL TUN ACCESS W/ PORT W/O FL MOD SED  03/11/2019   MICROLARYNGOSCOPY Left 01/17/2014   Procedure: MICROLARYNGOSCOPY WITH EXCISION OF THE BIOPSY OF LEFT VOCAL CORD LESION;  Surgeon: Izora Gala, MD;  Location: [facility];  Service: ENT;  Laterality: Left;   MICROLARYNGOSCOPY N/A 09/16/2020   Procedure: MICROLARYNGOSCOPY with Biopsy of vocal cord lesion;  Surgeon: Jerrell Belfast, MD;  Location: [facility] OR;  Service: ENT;  Laterality: N/A;   ORIF FOOT FRACTURE  2005   left   REFRACTIVE SURGERY Right    removed cloudiness in right eye after cataract removal     There were no vitals filed for this visit.   Subjective Assessment - 01/25/21 1510     Subjective I've been standing alot lately for cooking and activities like going to concerts and I can tell that when I have to stand for longer than 30 mins my thighs and Rt knee really start hurting. And my Lt hand has been bothering me more and I've not been able to use it  as much so I'm going to try to see my doctor this week for that and I'll bring up my legs as well. I also got a bike that I've been using at home and I'm able to use that for about 10-15 mins and don't have pain but I'm sitting down.    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 He started on 6/9 and will complete 7/19,  S/P 6 cycles of EPOCH-R completed on 06/01/18 and Radiation 07/25/18- 08/23/2018 to his Pelvis for Large B-cell Lymphoma, DDD    Patient Stated Goals to be able to get back in shape and exercise more, play golf, swlm, get back to being myself     Currently in Pain? No/denies   no pain with sitting                              [facility] Adult PT Treatment/Exercise - 01/25/21 0001       Self-Care   Other Self-Care Comments  Spent time discussing current fucntional status with pt. Decided that since overall he is doing well except for being limited by his standing tolerance due to bil thigh pain, he would like to be placed on hold to try to speak with a doctor about this new pain and then would like to resume PT after that. He did not wish to cont further treatment today after discussion as he repots he was going to social security office and didn't want standing to be further difficult after exercising.                          PT Long Term Goals - 01/25/21 1516       PT LONG TERM GOAL #1   Title Pt will return to baseline cervical ROM and not demonstrate any signs of lymphedema.    Baseline /Cervical A/ROM all WNLs - 12/15/2020; no problems with this any longer - 01/25/21    Status Achieved      PT LONG TERM GOAL #2   Title Pt will report back pain a max of 2/10 in the evening to allow improved comfort.    Baseline 7/10;No pain now, just some tightness after longer activities like mowing-12/15/2020; pt has had no LBP with any ADLs - 01/25/21    Status Achieved      PT LONG TERM GOAL #3   Title Pt will report he is able to walk for longer periods of time without fatigue as he does his activities of daily living    Baseline Fatigue much improved, no issues iwth ADLs but haven't tried walking around the block yet-12/15/2020; pts fatigue is much improved - 01/25/21    Status Achieved      PT LONG TERM GOAL #4   Title Pt will be able to single limb stance on either leg for at least 10 seconds each to decrease fall risk    Baseline unable; Rt LE 10 sec, Lt LE 2-3 sec - 12/15/2020; Rt LE 10 sec and Lt LE5 sec - 01/25/21    Status Partially Met      PT  LONG TERM GOAL #5   Title Pt will report he is able to  complete yard work without back pain to allow him to return to prior level of function.    Baseline Pt able to do this now with only reports of tightness after and wan't limited in acitvity-12/15/2020; no back pain with this, now has bil LE pain after 30 mins of standing - 01/25/21    Status Achieved                   Plan - 01/25/21 1537     Clinical Impression Statement Mr. Savoca returns today after about 83month He reports overall feeling much improved as he has been able to get back to his ADLs with, at this time, only feeling limited by bil thigh pain that he feels is due to circulation issues. This is causing him to start feeling pain after about 30 mins of standing or walking that is immediately relieved when he sits down. He has also been experiencing increased Lt hand pain. Encouraged him to call his nurse navigator, JHarlow Asa to discuss these issues. Pt has met all goals except SLS standing but did progress towards this. He would like to be placed on hold at this time until he sees a doctor and then can hopefully resume therapy to work on endurance as this is limited due to the pain at this time.    Personal Factors and Comorbidities Fitness;Time since onset of injury/illness/exacerbation;Comorbidity 1;Comorbidity 2    Comorbidities hx of lymphoma in 2020 with chemo and radiation, PAD    Examination-Activity Limitations Lift;Stand;Stairs    Examination-Participation Restrictions Community Activity;Yard Work    SMerchant navy officerEvolving/Moderate complexity    Rehab Potential Good    PT Frequency 1x / week    PT Duration 4 weeks    PT Treatment/Interventions ADLs/Self Care Home Management;Therapeutic activities;Therapeutic exercise;Neuromuscular re-education;Patient/family education;Manual techniques    PT Next Visit Plan Renewal done today but pt placed on hold until he gets i ntouch with nurse navigator to discuss thigh pain. Re-eval next session for new  goals.    PT Home Exercise Plan head and neck ROM exercises; balance and bil LE strength    Consulted and Agree with Plan of Care Patient             Patient will benefit from skilled therapeutic intervention in order to improve the following deficits and impairments:  Pain, Postural dysfunction, Decreased activity tolerance, Decreased knowledge of precautions, Increased muscle spasms, Difficulty walking, Decreased mobility, Decreased endurance  Visit Diagnosis: Chronic right-sided low back pain without sciatica  Difficulty in walking, not elsewhere classified  Malignant neoplasm of glottis ([facility]     Problem List Patient Active Problem List   Diagnosis Date Noted   Malignant neoplasm of glottis ([facility] 10/02/2020   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   Chronic pain of right knee 10/17/2018   Large cell (diffuse) non-Hodgkin's lymphoma ([facility] 05/10/2018   Bacteremia due to Escherichia coli    HTN (hypertension) 04/17/2018   RLS (restless legs syndrome) 04/17/2018   Abnormal LFTs 124/58/0998  Acute metabolic encephalopathy 133/82/5053  AKI (acute kidney injury) ([facility] 04/16/2018   Dehydration    Fever, unspecified    Sepsis ([facility]    Disorientation    Hypokalemia    Hypomagnesemia    Anemia    Encounter for antineoplastic chemotherapy    At high risk of tumor lysis syndrome    Swelling of lower leg  Diffuse large B cell lymphoma ([place]) 01/15/2018   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[place]) 01/12/2018   Counseling regarding advance care planning and goals of care 01/12/2018   Bilateral leg pain 05/27/2014    Otelia Limes, PTA 01/25/2021, 4:04 PM  [place], [place], [address] Phone: [phone]   Fax:  [phone]  Name: Jesus [name] MRN: [id number] Date of Birth: 1956-03-23

## 2021-01-25 NOTE — Addendum Note (Signed)
Addended by: Manus Gunning L on: 01/25/2021 04:59 PM   Modules accepted: Orders

## 2021-02-17 DIAGNOSIS — M65312 Trigger thumb, left thumb: Secondary | ICD-10-CM | POA: Insufficient documentation

## 2021-02-23 ENCOUNTER — Other Ambulatory Visit: Payer: Self-pay

## 2021-02-23 ENCOUNTER — Ambulatory Visit
Admission: EM | Admit: 2021-02-23 | Discharge: 2021-02-23 | Disposition: A | Discharge status: Home / Self Care | Payer: Medicare Other | Attending: Internal Medicine | Admitting: Internal Medicine

## 2021-02-23 ENCOUNTER — Encounter: Payer: Self-pay | Admitting: Hematology

## 2021-02-23 DIAGNOSIS — K122 Cellulitis and abscess of mouth: Secondary | ICD-10-CM | POA: Diagnosis not present

## 2021-02-23 MED ORDER — CLINDAMYCIN HCL 150 MG PO CAPS: 300.0000 mg | ORAL_CAPSULE | Freq: Three times a day (TID) | ORAL | 0 refills | Status: AC

## 2021-02-23 NOTE — ED Triage Notes (Signed)
Onset last night of right sided jaw swelling. Pt has taken benadryl without relief. No pain, difficulty breathing and chewing. No new products used. Pt thinks he may have eaten some expired pancake mix.

## 2021-02-23 NOTE — Discharge Instructions (Addendum)
You have been prescribed antibiotic to treat infection of the mouth.  Please go to the hospital if no improvement in symptoms in the next 24 to 48 hours.

## 2021-02-23 NOTE — ED Provider Notes (Signed)
[facility]    CSN: [id number] Arrival date & time: 02/23/21  1254      History   Chief Complaint Chief Complaint  Patient presents with   Facial Swelling    Right sided jaw    HPI Jesus [name] is a 65 y.o. male.   Patient presents with right facial swelling that started last night.  Patient denies any pain, fever, mouth pain, tooth pain, tooth abscess, drainage from mouth, sore throat, difficulty breathing, shortness of breath.  Denies any changes to the environment or medication changes.  Denies any upper respiratory symptoms.  Patient does report salivary gland stones and infections in the past.    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- last radiation 06-2018   Cataract    removed both eyes with l;ens implants    Family history of colon cancer    in his brother- dx'd age 35    History of chemotherapy    last 06-01-2018   History of kidney stones    History of radiation therapy    last radiation 06-2018   Hyperlipidemia    currently under control   Hypertension    Irregular heart beats    Lymphadenopathy    Pain, lower leg    Bilateral   Peripheral arterial disease ([facility])    Pre-diabetes    Red-green color blindness    RLS (restless legs syndrome)    Snores    Wears glasses     Patient Active Problem List   Diagnosis Date Noted   Malignant neoplasm of glottis ([place]) 10/02/2020   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   Chronic pain of right knee 10/17/2018   Large cell (diffuse) non-Hodgkin's lymphoma ([place]) 05/10/2018   Bacteremia due to Escherichia coli    HTN (hypertension) 04/17/2018   RLS (restless legs syndrome) 04/17/2018   Abnormal LFTs 29/79/8921   Acute metabolic encephalopathy 19/41/7408   AKI (acute kidney injury) ([place]) 04/16/2018   Dehydration     Fever, unspecified    Sepsis ([place])    Disorientation    Hypokalemia    Hypomagnesemia    Anemia    Encounter for antineoplastic chemotherapy    At high risk of tumor lysis syndrome    Swelling of lower leg    Diffuse large B cell lymphoma ([place]) 01/15/2018   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[place]) 01/12/2018   Counseling regarding advance care planning and goals of care 01/12/2018   Bilateral leg pain 05/27/2014    Past Surgical History:  Procedure Laterality Date   CATARACT EXTRACTION W/ INTRAOCULAR LENS  IMPLANT, BILATERAL     COLONOSCOPY     dislodged salava stone     FRACTURE SURGERY     HAND ARTHROPLASTY  1995   crushed left hand   INGUINAL LYMPH NODE BIOPSY Left 01/02/2018   Procedure: LEFT INGUINAL LYMPH NODE BIOPSY;  Surgeon: Rolm Bookbinder, MD;  Location: [facility];  Service: General;  Laterality: Left;   IR IMAGING GUIDED PORT INSERTION  01/15/2018   IR REMOVAL TUN ACCESS W/ PORT W/O FL MOD SED  03/11/2019   MICROLARYNGOSCOPY Left 01/17/2014   Procedure: MICROLARYNGOSCOPY WITH EXCISION OF THE BIOPSY OF LEFT VOCAL CORD LESION;  Surgeon: Izora Gala, MD;  Location: [facility];  Service: ENT;  Laterality: Left;   MICROLARYNGOSCOPY N/A 09/16/2020   Procedure: MICROLARYNGOSCOPY with Biopsy of vocal cord lesion;  Surgeon: Jerrell Belfast, MD;  Location: [facility];  Service: ENT;  Laterality: N/A;   ORIF FOOT FRACTURE  2005   left   REFRACTIVE SURGERY Right    removed cloudiness in right eye after cataract removal        Home Medications    Prior to Admission medications   Medication Sig Start Date End Date Taking? Authorizing Provider  clindamycin (CLEOCIN) 150 MG capsule Take 2 capsules (300 mg total) by mouth 3 (three) times daily for 10 days. 02/23/21 03/05/21 Yes Odis Luster, FNP  amLODipine (NORVASC) 10 MG tablet TAKE 1 TABLET BY MOUTH EVERY DAY Patient taking differently: Take 10 mg by mouth every evening. 02/19/20    Wendie Agreste, MD  aspirin EC 81 MG tablet Take 81 mg by mouth every evening. Swallow whole.    [provider]  lidocaine (XYLOCAINE) 2 % solution Patient: Mix 1part 2% viscous lidocaine, 1part H20. Swallow 50m of diluted mixture, 381m before meals and at bedtime, up to QID. 10/26/20   SqEppie GibsonMD  lisinopril (ZESTRIL) 20 MG tablet TAKE 1 TABLET BY MOUTH EVERY DAY Patient taking differently: Take 20 mg by mouth every evening. 02/19/20   GrWendie AgresteMD  Multiple Vitamin (MULTIVITAMIN WITH MINERALS) TABS tablet Take 1 tablet by mouth every evening.    [provider]  Multiple Vitamins-Minerals (MULTI ADULT GUMMIES PO) Take 1 tablet by mouth in the morning. Centrum    [provider]  Nutritional Supplements (FRUIT & VEGETABLE DAILY PO) Take 4 tablets by mouth daily in the afternoon.    [provider]  oxyCODONE-acetaminophen (PERCOCET/ROXICET) 5-325 MG tablet Take 1 tablet by mouth every 4 (four) hours as needed for severe pain. 11/03/20   KiGery PrayMD  tadalafil (CIALIS) 20 MG tablet Take 20 mg by mouth daily as needed for erectile dysfunction. 07/03/20   [provider]  vitamin B-12 (CYANOCOBALAMIN) 1000 MCG tablet Take 1,000 mcg by mouth every evening.    [provider]    Family History Family History  Problem Relation Age of Onset   Breast cancer Mother    Diabetes Father    Hypertension Father    Stroke Father    Mental illness Sister    Hypertension Daughter    Mental illness Daughter    Hypertension Brother    Colon cancer Brother 689     passed away 7-06-Dec-2018 Breast cancer Sister    Esophageal cancer Neg Hx    Colon polyps Neg Hx    Rectal cancer Neg Hx    Stomach cancer Neg Hx     Social History Social History   Tobacco Use   Smoking status: Former    Packs/day: 0.50    Years: 36.00    Pack years: 18.00    Types: Cigarettes    Quit date: 09/28/2020    Years since quitting: 0.4    Smokeless tobacco: Never   Tobacco comments:    he denies smoking in about 2 weeks 07/13/18  Vaping Use   Vaping Use: Never used  Substance Use Topics   Alcohol use: Yes    Alcohol/week: 6.0 standard drinks    Types: 6 Cans of beer per week    Comment: weekends   Drug use: Yes  Types: Marijuana    Comment: reports cocaine usage ~2X/ month; last use 12/26/17     Allergies   Bee venom, Antifungal [miconazole nitrate], and Zolpidem tartrate er   Review of Systems Review of Systems Per HPI  Physical Exam Triage Vital Signs ED Triage Vitals  Enc Vitals Group     BP 02/23/21 1414 135/78     Pulse Rate 02/23/21 1414 70     Resp 02/23/21 1414 18     Temp 02/23/21 1414 (!) 97.3 F (36.3 C)     Temp Source 02/23/21 1414 Oral     SpO2 02/23/21 1414 94 %     Weight --      Height --      Head Circumference --      Peak Flow --      Pain Score 02/23/21 1415 0     Pain Loc --      Pain Edu? --      Excl. in [place]? --    No data found.  Updated Vital Signs BP 135/78 (BP Location: Left Arm)   Pulse 70   Temp (!) 97.3 F (36.3 C) (Oral)   Resp 18   SpO2 94%   Visual Acuity Right Eye Distance:   Left Eye Distance:   Bilateral Distance:    Right Eye Near:   Left Eye Near:    Bilateral Near:     Physical Exam Constitutional:      General: He is not in acute distress.    Appearance: Normal appearance. He is not toxic-appearing or diaphoretic.  HENT:     Head: Normocephalic and atraumatic.     Mouth/Throat:     Lips: Pink.     Mouth: Mucous membranes are moist.     Dentition: Normal dentition. Does not have dentures. No dental tenderness, dental caries, dental abscesses or gum lesions.     Pharynx: Oropharynx is clear. No oropharyngeal exudate.     Comments: Patient has swelling noted to the outside of the face at right lower jaw.  Patient has associated swelling directly opposite to the swelling inside the oral mucosa of the cheek of the mouth. Eyes:     Extraocular  Movements: Extraocular movements intact.     Conjunctiva/sclera: Conjunctivae normal.  Pulmonary:     Effort: Pulmonary effort is normal.  Neurological:     General: No focal deficit present.     Mental Status: He is alert and oriented to person, place, and time. Mental status is at baseline.  Psychiatric:        Mood and Affect: Mood normal.        Behavior: Behavior normal.        Thought Content: Thought content normal.        Judgment: Judgment normal.     UC Treatments / Results  Labs (all labs ordered are listed, but only abnormal results are displayed) Labs Reviewed - No data to display  EKG   Radiology No results found.  Procedures Procedures (including critical care time)  Medications Ordered in UC Medications - No data to display  Initial Impression / Assessment and Plan / UC Course  I have reviewed the triage vital signs and the nursing notes.  Pertinent labs & imaging results that were available during my care of the patient were reviewed by me and considered in my medical decision making (see chart for details).     Differential diagnoses include dental abscess, mouth abscess, salivary gland abscess.  It appears that swelling and protrusion is too far forward for salivary glands. Although, infection appears to be present.  Will treat with clindamycin x10 days.  Advised patient to go to the hospital if no improvement in swelling in the next 24 to 48 hours.Discussed strict return precautions. Patient verbalized understanding and is agreeable with plan.  Final Clinical Impressions(s) / UC Diagnoses   Final diagnoses:  Cellulitis and abscess of mouth     Discharge Instructions      You have been prescribed antibiotic to treat infection of the mouth.  Please go to the hospital if no improvement in symptoms in the next 24 to 48 hours.     ED Prescriptions     Medication Sig Dispense Auth. Provider   clindamycin (CLEOCIN) 150 MG capsule Take 2 capsules  (300 mg total) by mouth 3 (three) times daily for 10 days. 60 capsule Odis Luster, FNP      PDMP not reviewed this encounter.   Odis Luster, [place] 02/23/21 [phone]

## 2021-03-08 ENCOUNTER — Ambulatory Visit: Payer: Self-pay

## 2021-03-08 NOTE — Progress Notes (Signed)
Nutrition Follow-up:  Patient with right vocal cord cancer.  Patient had completed radiation on 7/20.  Called for nutrition follow-up.  Patient reports that his appetite is good, "too good." Says that he has trouble swallowing hamburger or grainy meats.  Taste has improved but still not back to baseline.    Anthropometrics:   Weight 312 lb on 12/21/20, increased  307 lb on 7/18 310 lb on 7/14   NUTRITION DIAGNOSIS: Food and nutrition related knowledge deficit improved   INTERVENTION:  Encouraged follow-up with SLP.   Discussed chewing foods well and adding liquid for ease of swallowing.  Contact information provided and encouraged patient to contact if needed in the future    NEXT VISIT: no follow-up planned  Jesus [name], [place], [place] Registered Dietitian [phone] (mobile)

## 2021-04-05 ENCOUNTER — Encounter: Payer: Self-pay | Admitting: Hematology

## 2021-04-09 ENCOUNTER — Encounter: Payer: Self-pay | Admitting: Hematology

## 2021-05-14 ENCOUNTER — Encounter: Payer: Self-pay | Admitting: Hematology

## 2021-05-19 DIAGNOSIS — H5213 Myopia, bilateral: Secondary | ICD-10-CM | POA: Diagnosis not present

## 2021-05-21 ENCOUNTER — Inpatient Hospital Stay: Payer: No Typology Code available for payment source | Attending: Hematology | Admitting: Hematology

## 2021-05-21 ENCOUNTER — Inpatient Hospital Stay: Payer: No Typology Code available for payment source

## 2021-05-21 ENCOUNTER — Other Ambulatory Visit: Payer: Self-pay

## 2021-05-21 VITALS — BP 150/93 | HR 87 | Temp 97.9°F | Resp 20 | Wt 321.8 lb

## 2021-05-21 DIAGNOSIS — Z79899 Other long term (current) drug therapy: Secondary | ICD-10-CM | POA: Insufficient documentation

## 2021-05-21 DIAGNOSIS — N400 Enlarged prostate without lower urinary tract symptoms: Secondary | ICD-10-CM | POA: Insufficient documentation

## 2021-05-21 DIAGNOSIS — I10 Essential (primary) hypertension: Secondary | ICD-10-CM | POA: Diagnosis not present

## 2021-05-21 DIAGNOSIS — Z86718 Personal history of other venous thrombosis and embolism: Secondary | ICD-10-CM | POA: Diagnosis not present

## 2021-05-21 DIAGNOSIS — Z8572 Personal history of non-Hodgkin lymphomas: Secondary | ICD-10-CM | POA: Diagnosis not present

## 2021-05-21 DIAGNOSIS — Z923 Personal history of irradiation: Secondary | ICD-10-CM | POA: Diagnosis not present

## 2021-05-21 DIAGNOSIS — C8330 Diffuse large B-cell lymphoma, unspecified site: Secondary | ICD-10-CM

## 2021-05-21 DIAGNOSIS — Z9221 Personal history of antineoplastic chemotherapy: Secondary | ICD-10-CM | POA: Insufficient documentation

## 2021-05-21 LAB — CMP (CANCER CENTER ONLY)
ALT: 10 U/L (ref 0–44)
AST: 12 U/L — ABNORMAL LOW (ref 15–41)
Albumin: 4.2 g/dL (ref 3.5–5.0)
Alkaline Phosphatase: 65 U/L (ref 38–126)
Anion gap: 8 (ref 5–15)
BUN: 20 mg/dL (ref 8–23)
CO2: 25 mmol/L (ref 22–32)
Calcium: 9.3 mg/dL (ref 8.9–10.3)
Chloride: 107 mmol/L (ref 98–111)
Creatinine: 1.15 mg/dL (ref 0.61–1.24)
GFR, Estimated: >60 mL/min (ref >60)
Glucose, Bld: 174 mg/dL — ABNORMAL HIGH (ref 70–99)
Potassium: 4.1 mmol/L (ref 3.5–5.1)
Sodium: 140 mmol/L (ref 135–145)
Total Bilirubin: 0.3 mg/dL (ref 0.3–1.2)
Total Protein: 7.2 g/dL (ref 6.5–8.1)

## 2021-05-21 LAB — CBC WITH DIFFERENTIAL (CANCER CENTER ONLY)
Abs Immature Granulocytes: 0.02 10*3/uL (ref 0.00–0.07)
Basophils Absolute: 0.1 10*3/uL (ref 0.0–0.1)
Basophils Relative: 1 %
Eosinophils Absolute: 0.1 10*3/uL (ref 0.0–0.5)
Eosinophils Relative: 2 %
HCT: 36.2 % — ABNORMAL LOW (ref 39.0–52.0)
Hemoglobin: 11.8 g/dL — ABNORMAL LOW (ref 13.0–17.0)
Immature Granulocytes: 0 %
Lymphocytes Relative: 33 %
Lymphs Abs: 2 10*3/uL (ref 0.7–4.0)
MCH: 29.5 pg (ref 26.0–34.0)
MCHC: 32.6 g/dL (ref 30.0–36.0)
MCV: 90.5 fL (ref 80.0–100.0)
Monocytes Absolute: 0.4 10*3/uL (ref 0.1–1.0)
Monocytes Relative: 7 %
Neutro Abs: 3.5 10*3/uL (ref 1.7–7.7)
Neutrophils Relative %: 57 %
Platelet Count: 305 10*3/uL (ref 150–400)
RBC: 4 MIL/uL — ABNORMAL LOW (ref 4.22–5.81)
RDW: 13.1 % (ref 11.5–15.5)
WBC Count: 6.1 10*3/uL (ref 4.0–10.5)
nRBC: 0 % (ref 0.0–0.2)

## 2021-05-21 LAB — LACTATE DEHYDROGENASE: LDH: 151 U/L (ref 98–192)

## 2021-05-21 LAB — TSH: TSH: 1.426 u[IU]/mL (ref 0.320–4.118)

## 2021-05-25 ENCOUNTER — Telehealth: Payer: Self-pay | Admitting: Hematology

## 2021-05-25 NOTE — Telephone Encounter (Signed)
Left message with follow-up appointment per 1/13 los.

## 2021-05-27 ENCOUNTER — Encounter: Payer: Self-pay | Admitting: Hematology

## 2021-05-27 NOTE — Progress Notes (Addendum)
[place]   Telephone:([phone] Fax:([phone]) 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 male is here because of left lower extremity edema and lymphadenopathy.  The patient was seen in the emergency room this past Friday for the same issue.  A CT of the abdomen and pelvis was performed showing bulky left inguinal, left hemipelvic, and retroperitoneal adenopathy.  He was referred to [place] from the emergency room for further evaluation.  Doppler ultrasound of the left lower extremity was performed and was negative for DVT. Patient reports that he has been having left lower extremity edema in his left groin and left leg for approximately 1 month.  He states that the swelling in the left groin started to get better but then worsened.  The left lower extremity edema has slowly worsened over time.  Patient denies having fevers and chills.  He reports that he does have night sweats at times.  He reported having headaches approximate 1 [place]  He thinks his headaches are related to not having his blood pressure medication.  His headaches have now resolved.  He denies visual changes.  The patient denies chest pain, shortness of breath and cough.  No nausea, vomiting, constipation, diarrhea.  Denies abdominal pain.  The patient denies recent weight loss and has actually gained weight recently.  Patient denies epistaxis, bleeding gums, hemoptysis,  hematuria, but occasionally, and melena.  He reports increased urinary frequency over the past month but no dysuria.  The patient is here for evaluation and discussion of his recent CT and lab findings.  Interval History:   Jesus [name] is here for his scheduled 92-monthfollow-up for continued surveillance of his T-cell rich B-cell lymphoma. 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  He continues to follow with Dr. SIsidore Moosand his ear nose throat doctor to manage this.  He notes no fevers no chills no night sweats. No new enlarged lymph nodes.  No new chest pain or shortness of breath. No new abdominal pain or distention. No change in bowel habits. He does endorse that he has been eating better and has gained about 11 pounds over the last 6 months.  Labs done today were reviewed with him.  MEDICAL HISTORY:   Past Medical History:  Diagnosis Date   Allergy    Anemia    during chemo   Arthritis    knee    Blood transfusion without reported diagnosis    Cancer ([facility]    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- last radiation 06-2018   Cataract    removed both eyes with l;ens implants    Family history of colon cancer    in his brother- dx'd age 66   History of chemotherapy    last 06-01-2018   History of kidney stones    History of radiation therapy  last radiation 06-2018   Hyperlipidemia    currently under control   Hypertension    Irregular heart beats    Lymphadenopathy    Pain, lower leg    Bilateral   Peripheral arterial disease (HCC)    Pre-diabetes    Red-green color blindness    RLS (restless legs syndrome)    Snores    Wears glasses     SURGICAL HISTORY: Past Surgical History:  Procedure Laterality Date   CATARACT EXTRACTION W/ INTRAOCULAR LENS  IMPLANT, BILATERAL     COLONOSCOPY     dislodged salava stone     FRACTURE SURGERY      HAND ARTHROPLASTY  1995   crushed left hand   INGUINAL LYMPH NODE BIOPSY Left 01/02/2018   Procedure: LEFT INGUINAL LYMPH NODE BIOPSY;  Surgeon: Rolm Bookbinder, MD;  Location: [place];  Service: General;  Laterality: Left;   IR IMAGING GUIDED PORT INSERTION  01/15/2018   IR REMOVAL TUN ACCESS W/ PORT W/O FL MOD SED  03/11/2019   MICROLARYNGOSCOPY Left 01/17/2014   Procedure: MICROLARYNGOSCOPY WITH EXCISION OF THE BIOPSY OF LEFT VOCAL CORD LESION;  Surgeon: Izora Gala, MD;  Location: [place];  Service: ENT;  Laterality: Left;   MICROLARYNGOSCOPY N/A 09/16/2020   Procedure: MICROLARYNGOSCOPY with Biopsy of vocal cord lesion;  Surgeon: Jerrell Belfast, MD;  Location: [facility];  Service: ENT;  Laterality: N/A;   ORIF FOOT FRACTURE  2005   left   REFRACTIVE SURGERY Right    removed cloudiness in right eye after cataract removal     SOCIAL HISTORY: Social History   Socioeconomic History   Marital status: Divorced    Spouse name: Not on file   Number of children: 3   Years of education: Not on file   Highest education level: Not on file  Occupational History   Not on file  Tobacco Use   Smoking status: Former    Packs/day: 0.50    Years: 36.00    Pack years: 18.00    Types: Cigarettes    Quit date: 09/28/2020    Years since quitting: 0.6   Smokeless tobacco: Never   Tobacco comments:    he denies smoking in about 2 weeks 07/13/18  Vaping Use   Vaping Use: Never used  Substance and Sexual Activity   Alcohol use: Yes    Alcohol/week: 6.0 standard drinks    Types: 6 Cans of beer per week    Comment: weekends   Drug use: Yes    Types: Marijuana    Comment: reports cocaine usage ~2X/ month; last use 12/26/17   Sexual activity: Not on file  Other Topics Concern   Not on file  Social History Narrative   Not on file   Social Determinants of Health   Financial Resource Strain: Low Risk    Difficulty of Paying Living Expenses: Not very hard  Food Insecurity: No Food  Insecurity   Worried About Charity fundraiser in the Last Year: Never true   Ran Out of Food in the Last Year: Never true  Transportation Needs: No Transportation Needs   Lack of Transportation (Medical): No   Lack of Transportation (Non-Medical): No  Physical Activity: Not on file  Stress: Not on file  Social Connections: Moderately Integrated   Frequency of Communication with Friends and Family: Three times a week   Frequency of Social Gatherings with Friends and Family: Three times a week   Attends Religious Services: 1 to  4 times per year   Active Member of Clubs or Organizations: Yes   Attends Archivist Meetings: 1 to 4 times per year   Marital Status: Divorced  Human resources officer Violence: Not on file    FAMILY HISTORY: Family History  Problem Relation Age of Onset   Breast cancer Mother    Diabetes Father    Hypertension Father    Stroke Father    Mental illness Sister    Hypertension Daughter    Mental illness Daughter    Hypertension Brother    Colon cancer Brother 10       passed away December 03, 2018   Breast cancer Sister    Esophageal cancer Neg Hx    Colon polyps Neg Hx    Rectal cancer Neg Hx    Stomach cancer Neg Hx     ALLERGIES:  is allergic to bee venom, antifungal [miconazole nitrate], and zolpidem tartrate er.  MEDICATIONS:  Current Outpatient Medications  Medication Sig Dispense Refill   amLODipine (NORVASC) 10 MG tablet TAKE 1 TABLET BY MOUTH EVERY DAY (Patient taking differently: Take 10 mg by mouth every evening.) 30 tablet 0   aspirin EC 81 MG tablet Take 81 mg by mouth every evening. Swallow whole.     lidocaine (XYLOCAINE) 2 % solution Patient: Mix 1part 2% viscous lidocaine, 1part H20. Swallow 48m of diluted mixture, 336m before meals and at bedtime, up to QID. 200 mL 3   lisinopril (ZESTRIL) 20 MG tablet TAKE 1 TABLET BY MOUTH EVERY DAY (Patient taking differently: Take 20 mg by mouth every evening.) 30 tablet 0   Multiple Vitamin  (MULTIVITAMIN WITH MINERALS) TABS tablet Take 1 tablet by mouth every evening.     Multiple Vitamins-Minerals (MULTI ADULT GUMMIES PO) Take 1 tablet by mouth in the morning. Centrum     Nutritional Supplements (FRUIT & VEGETABLE DAILY PO) Take 4 tablets by mouth daily in the afternoon.     oxyCODONE-acetaminophen (PERCOCET/ROXICET) 5-325 MG tablet Take 1 tablet by mouth every 4 (four) hours as needed for severe pain. 30 tablet 0   tadalafil (CIALIS) 20 MG tablet Take 20 mg by mouth daily as needed for erectile dysfunction.     vitamin B-12 (CYANOCOBALAMIN) 1000 MCG tablet Take 1,000 mcg by mouth every evening.     No current facility-administered medications for this visit.    REVIEW OF SYSTEMS:   .10 Point review of Systems was done is negative except as noted above.  PHYSICAL EXAMINATION:  Vitals:   05/21/21 1330  BP: (!) 150/93  Pulse: 87  Resp: 20  Temp: 97.9 F (36.6 C)    Filed Weights   05/21/21 1330  Weight: (!) 321 lb 12.8 oz (146 kg)  . GENERAL:alert, in no acute distress and comfortable SKIN: no acute rashes, no significant lesions EYES: conjunctiva are pink and non-injected, sclera anicteric OROPHARYNX: MMM, no exudates, no oropharyngeal erythema or ulceration NECK: supple, no JVD LYMPH:  no palpable lymphadenopathy in the cervical, axillary or inguinal regions LUNGS: clear to auscultation b/l with normal respiratory effort HEART: regular rate & rhythm ABDOMEN:  normoactive bowel sounds , non tender, not distended. Extremity: no pedal edema PSYCH: alert & oriented x 3 with fluent speech NEURO: no focal motor/sensory deficits    LABORATORY DATA:   I have reviewed the data as listed CBC Latest Ref Rng & Units 05/21/2021 11/19/2020 09/18/2020  WBC 4.0 - 10.5 K/uL 6.1 6.4 9.9  Hemoglobin 13.0 - 17.0 g/dL 11.8(L) 12.2(L) 13.3  Hematocrit 39.0 - 52.0 % 36.2(L) 36.3(L) 38.2(L)  Platelets 150 - 400 K/uL 305 317 298   CBC    Component Value Date/Time   WBC 6.1  05/21/2021 1243   WBC 6.4 11/19/2020 1011   RBC 4.00 (L) 05/21/2021 1243   HGB 11.8 (L) 05/21/2021 1243   HCT 36.2 (L) 05/21/2021 1243   PLT 305 05/21/2021 1243   MCV 90.5 05/21/2021 1243   MCV 87.3 12/22/2017 1518   MCH 29.5 05/21/2021 1243   MCHC 32.6 05/21/2021 1243   RDW 13.1 05/21/2021 1243   LYMPHSABS 2.0 05/21/2021 1243   MONOABS 0.4 05/21/2021 1243   EOSABS 0.1 05/21/2021 1243   BASOSABS 0.1 05/21/2021 1243    CMP Latest Ref Rng & Units 05/21/2021 11/19/2020 09/18/2020  Glucose 70 - 99 mg/dL 174(H) 128(H) 105(H)  BUN 8 - 23 mg/dL 20 15 20   Creatinine 0.61 - 1.24 mg/dL 1.15 1.02 1.02  Sodium 135 - 145 mmol/L 140 141 140  Potassium 3.5 - 5.1 mmol/L 4.1 4.4 3.8  Chloride 98 - 111 mmol/L 107 103 105  CO2 22 - 32 mmol/L 25 29 25   Calcium 8.9 - 10.3 mg/dL 9.3 9.4 9.5  Total Protein 6.5 - 8.1 g/dL 7.2 7.3 7.1  Total Bilirubin 0.3 - 1.2 mg/dL 0.3 0.8 0.3  Alkaline Phos 38 - 126 U/L 65 66 53  AST 15 - 41 U/L 12(L) 12(L) 21  ALT 0 - 44 U/L 10 10 18    . Lab Results  Component Value Date   LDH 151 05/21/2021   Tsh 1.42 (05/21/2021)   01/02/18 Left Inguinal LN Bx:   12/26/17 Tissue Flow Cytometry:   12/26/17 Inguinal Core biopsy:    RADIOGRAPHIC STUDIES: I have personally reviewed the radiological images as listed and agreed with the findings in the report. No results found.  ASSESSMENT & PLAN:   This is a pleasant 66 y.o. 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   CT of the abdomen and pelvis performed on 12/22/2017 showed bulky left inguinal, left hemipelvic, and  retroperitoneal adenopathy.     01/02/18 Left inguinal LN Biopsy revealed T-Cell/histocyte rich Large B-Cell Lymphoma   12/27/17 ECHO revealed LV  EF of 55-60%    01/05/18 PET/CT revealed Massively enlarged pelvic lymph nodes intense metabolic activity consistent lymphoma. 2. Additional hypermetabolic lymph nodes in the porta hepatis and retroperitoneum LEFT aorta. 3. Solitary hypermetabolic mediastinal lymph node in the upper LEFT Mediastinum. 4. 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 5. Normal spleen.     HIV non reactive on 12/22/2017.  Hep C and hep B serology negative.   03/14/18 PET/CT revealed PET-CT findings suggest an excellent response to chemotherapy. The abdominal lymphadenopathy has near completely resolved and demonstrates a near complete metabolic response. The pelvic and inguinal adenopathy has significantly decreased in size and the metabolic activity has significantly decreased. 2. Diffuse marrow activity likely due to chemotherapy and or marrow stimulating drugs. I do not see any discrete persistent lesions.    04/17/18 CT Head revealed Subtle mesial caudothalamic hypodensities may be artifact though, the could reflect encephalitis or Wernicke's encephalopathy. Consider MRI of the head with and without contrast. 2. Mild chronic small vessel ischemic changes    S/p 6 cycles of EPOCH-R completed on 06/01/18  06/28/18 PET/CT revealed Continued good response to treatment. No residual measurable or hypermetabolic abdominal lymphadenopathy and no recurrent osseous disease. 2. Interval decrease in size of the left operator region lymph node and also the left inguinal lymph node. However, the both have small foci of slightly increased hypermetabolism which bears surveillance. 3. No new or progressive lymphadenopathy in the neck, chest, abdomen or pelvis.  S/p 39.6 Gy in 22 fractions between 07/25/18 and 08/23/18  02/13/2019 CT C/A/P ([id number]) ([id number]) revealed "1. Response to therapy of pelvic adenopathy compared to the PET of 06/28/2018. 2. No new or progressive disease. 3. Mild  prostatomegaly. 4. 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 5. Hepatomegaly."   2) left lower extremity swelling- now resolved  Doppler ultrasound for DVT was negative in the left lower extremity.   Likely from venous compression +/- lymphatic obstruction from bulky left inguinal, left hemipelvic, and  retroperitoneal adenopathy.    3) S/p Port a cath placement - removed 03/11/2019  PLAN: -Patient has no clinical symptoms or signs suggestive of lymphoma recurrence/progression at this time. -Labs show stable CBC with a hemoglobin of 11.8 and normal WBC count and platelets CMP within normal limits other than a blood sugar of 174 LDH 151 -Medication for additional evaluation or treatment of the patient's lymphoma at this time.  4) vocal cord squamous cell carcinoma status post definitive radiation TSH within normal limits today -Continue follow-up with ENT and radiation oncology for surveillance.  FOLLOW UP: RTC with Dr Irene Limbo with labs in 6 months   All of the patient's questions were answered with apparent satisfaction. The patient knows to call the clinic with any problems, questions or concerns.   Sullivan Lone MD [place] AAHIVMS [facility] Hematology/Oncology Physician [facility]

## 2021-06-02 ENCOUNTER — Ambulatory Visit
Admission: RE | Admit: 2021-06-02 | Discharge: 2021-06-02 | Disposition: A | Discharge status: Home / Self Care | Payer: No Typology Code available for payment source | Source: Ambulatory Visit | Attending: Radiation Oncology | Admitting: Radiation Oncology

## 2021-06-02 ENCOUNTER — Other Ambulatory Visit: Payer: Self-pay

## 2021-06-02 ENCOUNTER — Encounter: Payer: Self-pay | Admitting: Radiation Oncology

## 2021-06-02 VITALS — BP 139/58 | HR 98 | Temp 97.6°F | Resp 18 | Ht 72.0 in | Wt 322.4 lb

## 2021-06-02 DIAGNOSIS — C320 Malignant neoplasm of glottis: Secondary | ICD-10-CM | POA: Insufficient documentation

## 2021-06-02 DIAGNOSIS — Z08 Encounter for follow-up examination after completed treatment for malignant neoplasm: Secondary | ICD-10-CM | POA: Diagnosis not present

## 2021-06-02 DIAGNOSIS — K1370 Unspecified lesions of oral mucosa: Secondary | ICD-10-CM | POA: Insufficient documentation

## 2021-06-02 MED ORDER — OXYMETAZOLINE HCL 0.05 % NA SOLN
2.0000 | Freq: Once | NASAL | Status: AC
  Administered 2021-06-02: 16:00:00 2 via NASAL
  Filled 2021-06-02: qty 30

## 2021-06-02 NOTE — Progress Notes (Signed)
Radiation Oncology         [phone]) [phone] ________________________________  Name: Jesus [name] MRN: [id number]  Date: 06/02/2021  DOB: Sep 15, 1955  Follow-Up Visit Note  CC: Shirline Frees, MD  Shirline Frees, MD  Diagnosis and Prior Radiotherapy:       ICD-10-CM   1. Malignant neoplasm of glottis (HCC)  C32.0 oxymetazoline (AFRIN) 0.05 % nasal spray 2 spray    Fiberoptic laryngoscopy      Cancer Staging  Diffuse large B-cell lymphoma of lymph nodes of multiple regions [facility]) Staging form: Hodgkin and Non-Hodgkin Lymphoma, AJCC 8th Edition - Clinical: Stage IV - Signed by Eppie Gibson, MD on 07/13/2018  Malignant neoplasm of glottis [facility]) Staging form: Larynx - Glottis, AJCC 8th Edition - Clinical stage from 10/02/2020: Stage I (cT1a, cN0, cM0) - Signed by Eppie Gibson, MD on 10/02/2020 Stage prefix: Initial diagnosis   Radiation Treatment Dates: 10/15/2020 through 11/25/2020 Site Technique Total Dose (Gy) Dose per Fx (Gy) Completed Fx Beam Energies  Larynx: HN_larynx 3D 63/63 2.25 28/28 6X   CHIEF COMPLAINT:  Here for follow-up and surveillance of throat cancer  Narrative:   Mr. Perkey presents for follow-up after completing radiation to his larynx on 11/25/2020  Pain issues, if any: Patient denies Using a feeding tube?: N/A Weight changes, if any:  Wt Readings from Last 3 Encounters:  06/02/21 (!) 322 lb 6 oz (146.2 kg)  05/21/21 (!) 321 lb 12.8 oz (146 kg)  12/21/20 (!) 312 lb 2 oz (141.6 kg)   Swallowing issues, if any: Patient denies; Reports he can eat/drink a wide variety. Does notice he'll cough occasionally after eating crunchier food or ground meat, but states neither are difficult to swallow Smoking or chewing tobacco? No tobacco, yes marijuana Using fluoride trays daily? N/A--sees his periodontist regularly (in the process of getting established with a new dentist) Last ENT visit was on: Not since diagnosis Other notable issues, if any: Had F/U with medical  oncologist Dr. Irene Limbo on 05/21/2021. Denies any ear or jaw pain, or difficulty opening his mouth. Denies any signs/symptoms of lymphedema to his neck. Denies any trouble sleeping or issues with lingering fatigue. Denies dealing with dry mouth or thick saliva. States the lingering effects that still trouble him are the vocal hoarseness/strain, and coughing occasionally after eating                       ALLERGIES:  is allergic to bee venom, antifungal [miconazole nitrate], and zolpidem tartrate er.  Meds: Current Outpatient Medications  Medication Sig Dispense Refill   amLODipine (NORVASC) 10 MG tablet TAKE 1 TABLET BY MOUTH EVERY DAY (Patient taking differently: Take 10 mg by mouth every evening.) 30 tablet 0   aspirin EC 81 MG tablet Take 81 mg by mouth every evening. Swallow whole.     lisinopril (ZESTRIL) 20 MG tablet TAKE 1 TABLET BY MOUTH EVERY DAY (Patient taking differently: Take 20 mg by mouth every evening.) 30 tablet 0   Multiple Vitamin (MULTIVITAMIN WITH MINERALS) TABS tablet Take 1 tablet by mouth every evening.     Multiple Vitamins-Minerals (MULTI ADULT GUMMIES PO) Take 1 tablet by mouth in the morning. Centrum     Nutritional Supplements (FRUIT & VEGETABLE DAILY PO) Take 4 tablets by mouth daily in the afternoon.     tadalafil (CIALIS) 20 MG tablet Take 20 mg by mouth daily as needed for erectile dysfunction.     vitamin B-12 (CYANOCOBALAMIN) 1000 MCG tablet Take 1,000  mcg by mouth every evening.     Current Facility-Administered Medications  Medication Dose Route Frequency Provider Last Rate Last Admin   oxymetazoline (AFRIN) 0.05 % nasal spray 2 spray  2 spray Each Nare Once Eppie Gibson, MD        Physical Findings: The patient is in no acute distress. Patient is alert and oriented. Wt Readings from Last 3 Encounters:  06/02/21 (!) 322 lb 6 oz (146.2 kg)  05/21/21 (!) 321 lb 12.8 oz (146 kg)  12/21/20 (!) 312 lb 2 oz (141.6 kg)    height is 6' (1.829 m) and weight is  322 lb 6 oz (146.2 kg) (abnormal). His temporal temperature is 97.6 F (36.4 C). His blood pressure is 139/58 (abnormal) and his pulse is 98. His respiration is 18 and oxygen saturation is 99%. .  General: Alert and oriented, in no acute distress HEENT: Head is normocephalic. Extraocular movements are intact. Oropharynx is notable for no lesions in upper throat. 69m area of white irritated mucosal tissue at buccal mucosa adjacent to posterior left lower molars. Neck: Neck is notable for mild residual hyperpigmentation over the neck, but his skin is smooth and intact; no masses Skin: Skin in treatment fields shows satisfactory healing as above Psychiatric: Judgment and insight are intact. Affect is appropriate.  PROCEDURE NOTE: After obtaining consent and spraying nasal cavity with topical oxymetazoline, the flexible endoscope was coated w/ lidocaine gel and introduced and passed through the nasal cavity.  The nasopharynx, oropharynx, hypopharynx, and larynx  were then examined. No lesions appreciated in the mucosal axis.  The true cords were symmetrically mobile. He tolerated the procedure well.  Lab Findings: Lab Results  Component Value Date   WBC 6.1 05/21/2021   HGB 11.8 (L) 05/21/2021   HCT 36.2 (L) 05/21/2021   MCV 90.5 05/21/2021   PLT 305 05/21/2021    Lab Results  Component Value Date   TSH 1.426 05/21/2021    Radiographic Findings: No results found.  Impression/Plan:    1) Head and Neck Cancer Status: NED  2) Nutritional Status: Stable to increased Wt Readings from Last 3 Encounters:  06/02/21 (!) 322 lb 6 oz (146.2 kg)  05/21/21 (!) 321 lb 12.8 oz (146 kg)  12/21/20 (!) 312 lb 2 oz (141.6 kg)    PEG tube: None  3) Risk Factors: The patient has been educated about risk factors including alcohol and smoking /tobacco abuse; they understand that avoidance of alcohol and smoking/tobacco is important to prevent recurrences as well as other cancers. He will stop marijuana  per our discussion today.  4) Swallowing: no issues  5)  Thyroid function: Check annually with PCP or at cancer center -[facility]NL Lab Results  Component Value Date   TSH 1.426 05/21/2021    6) Other:  buccal mucosal lesion - new per patient - ? r/t very hot food -- he will show his periodontist in 2 days.  7) Follow-up with me in  [id number] patient was encouraged to call with any issues or questions before then. JeAnderson MaltaN [facility] request Mr. MuRackleye contacted and scheduled for routine follow-up with Dr. ShWilburn Cornelia    On date of service, in total, I spent 30 minutes on this encounter. Patient was seen in person. _____________________________________   SaEppie GibsonMD

## 2021-06-02 NOTE — Progress Notes (Signed)
Jesus [name] presents for follow-up after completing radiation to his larynx on 11/25/2020  Pain issues, if any: Patient denies Using a feeding tube?: N/A Weight changes, if any:  Wt Readings from Last 3 Encounters:  06/02/21 (!) 322 lb 6 oz (146.2 kg)  05/21/21 (!) 321 lb 12.8 oz (146 kg)  12/21/20 (!) 312 lb 2 oz (141.6 kg)   Swallowing issues, if any: Patient denies; Reports he can eat/drink a wide variety. Does notice he'll cough occasionally after eating crunchier food or ground meat, but states neither are difficult to swallow Smoking or chewing tobacco? None Using fluoride trays daily? N/A--sees his periodontist regularly (in the process of getting established with a new dentist) Last ENT visit was on: Not since diagnosis Other notable issues, if any: Had F/U with medical oncologist Dr. Irene Limbo on 05/21/2021. Denies any ear or jaw pain, or difficulty opening his mouth. Denies any signs/symptoms of lymphedema to his neck. Denies any trouble sleeping or issues with lingering fatigue. Denies dealing with dry mouth or thick saliva. States the lingering effects that still trouble him are the vocal hoarseness/strain, and coughing occasionally after eating

## 2021-06-03 NOTE — Progress Notes (Signed)
Oncology Nurse Navigator Documentation   Per patient's 06/02/21 post-treatment follow-up with Dr. Isidore Moos, sent fax to [facility] ENT Scheduling with request Mr. Paulding be contacted and scheduled for routine post-RT follow-up with Dr. Wilburn Cornelia in 3 months.  Notification of successful fax transmission received.   Harlow Asa RN, BSN, OCN Head & Neck Oncology Nurse [place] at [facility] Phone # [phone]  Fax # [phone]

## 2021-06-21 DIAGNOSIS — C320 Malignant neoplasm of glottis: Secondary | ICD-10-CM | POA: Diagnosis not present

## 2021-06-21 DIAGNOSIS — I10 Essential (primary) hypertension: Secondary | ICD-10-CM | POA: Diagnosis not present

## 2021-06-21 DIAGNOSIS — Z8579 Personal history of other malignant neoplasms of lymphoid, hematopoietic and related tissues: Secondary | ICD-10-CM | POA: Diagnosis not present

## 2021-06-21 DIAGNOSIS — N529 Male erectile dysfunction, unspecified: Secondary | ICD-10-CM | POA: Diagnosis not present

## 2021-06-21 DIAGNOSIS — E782 Mixed hyperlipidemia: Secondary | ICD-10-CM | POA: Diagnosis not present

## 2021-06-21 DIAGNOSIS — E119 Type 2 diabetes mellitus without complications: Secondary | ICD-10-CM | POA: Diagnosis not present

## 2021-07-02 ENCOUNTER — Encounter: Payer: Self-pay | Admitting: Hematology

## 2021-07-08 ENCOUNTER — Encounter: Payer: Self-pay | Admitting: Emergency Medicine

## 2021-07-08 ENCOUNTER — Encounter (HOSPITAL_BASED_OUTPATIENT_CLINIC_OR_DEPARTMENT_OTHER): Payer: Self-pay

## 2021-07-08 ENCOUNTER — Emergency Department (HOSPITAL_BASED_OUTPATIENT_CLINIC_OR_DEPARTMENT_OTHER): Payer: No Typology Code available for payment source | Admitting: Radiology

## 2021-07-08 ENCOUNTER — Other Ambulatory Visit: Payer: Self-pay

## 2021-07-08 ENCOUNTER — Ambulatory Visit (INDEPENDENT_AMBULATORY_CARE_PROVIDER_SITE_OTHER)
Admission: EM | Admit: 2021-07-08 | Discharge: 2021-07-08 | Disposition: A | Discharge status: Other Acute Inpatient Hospital | Payer: No Typology Code available for payment source | Source: Home / Self Care

## 2021-07-08 ENCOUNTER — Emergency Department (HOSPITAL_BASED_OUTPATIENT_CLINIC_OR_DEPARTMENT_OTHER): Payer: No Typology Code available for payment source

## 2021-07-08 ENCOUNTER — Emergency Department (HOSPITAL_BASED_OUTPATIENT_CLINIC_OR_DEPARTMENT_OTHER)
Admission: EM | Admit: 2021-07-08 | Discharge: 2021-07-08 | Disposition: A | Discharge status: Home / Self Care | Payer: No Typology Code available for payment source | Attending: Emergency Medicine | Admitting: Emergency Medicine

## 2021-07-08 DIAGNOSIS — R519 Headache, unspecified: Secondary | ICD-10-CM | POA: Insufficient documentation

## 2021-07-08 DIAGNOSIS — Z7982 Long term (current) use of aspirin: Secondary | ICD-10-CM | POA: Insufficient documentation

## 2021-07-08 DIAGNOSIS — Z79899 Other long term (current) drug therapy: Secondary | ICD-10-CM | POA: Diagnosis not present

## 2021-07-08 DIAGNOSIS — R42 Dizziness and giddiness: Secondary | ICD-10-CM

## 2021-07-08 DIAGNOSIS — Z8572 Personal history of non-Hodgkin lymphomas: Secondary | ICD-10-CM | POA: Insufficient documentation

## 2021-07-08 DIAGNOSIS — R600 Localized edema: Secondary | ICD-10-CM | POA: Diagnosis not present

## 2021-07-08 DIAGNOSIS — R0609 Other forms of dyspnea: Secondary | ICD-10-CM | POA: Diagnosis not present

## 2021-07-08 DIAGNOSIS — Z20822 Contact with and (suspected) exposure to covid-19: Secondary | ICD-10-CM | POA: Diagnosis not present

## 2021-07-08 DIAGNOSIS — I10 Essential (primary) hypertension: Secondary | ICD-10-CM | POA: Insufficient documentation

## 2021-07-08 DIAGNOSIS — R0600 Dyspnea, unspecified: Secondary | ICD-10-CM | POA: Diagnosis not present

## 2021-07-08 DIAGNOSIS — R0602 Shortness of breath: Secondary | ICD-10-CM | POA: Diagnosis not present

## 2021-07-08 LAB — TROPONIN I (HIGH SENSITIVITY): Troponin I (High Sensitivity): 7 ng/L (ref <18)

## 2021-07-08 LAB — BASIC METABOLIC PANEL
Anion gap: 12 (ref 5–15)
BUN: 23 mg/dL (ref 8–23)
CO2: 25 mmol/L (ref 22–32)
Calcium: 9.8 mg/dL (ref 8.9–10.3)
Chloride: 101 mmol/L (ref 98–111)
Creatinine, Ser: 1.03 mg/dL (ref 0.61–1.24)
GFR, Estimated: >60 mL/min (ref >60)
Glucose, Bld: 181 mg/dL — ABNORMAL HIGH (ref 70–99)
Potassium: 4 mmol/L (ref 3.5–5.1)
Sodium: 138 mmol/L (ref 135–145)

## 2021-07-08 LAB — CBC
HCT: 36.6 % — ABNORMAL LOW (ref 39.0–52.0)
Hemoglobin: 11.9 g/dL — ABNORMAL LOW (ref 13.0–17.0)
MCH: 29.1 pg (ref 26.0–34.0)
MCHC: 32.5 g/dL (ref 30.0–36.0)
MCV: 89.5 fL (ref 80.0–100.0)
Platelets: 300 10*3/uL (ref 150–400)
RBC: 4.09 MIL/uL — ABNORMAL LOW (ref 4.22–5.81)
RDW: 13.3 % (ref 11.5–15.5)
WBC: 5.8 10*3/uL (ref 4.0–10.5)
nRBC: 0 % (ref 0.0–0.2)

## 2021-07-08 LAB — RESP PANEL BY RT-PCR (FLU A&B, COVID) ARPGX2
Influenza A by PCR: NEGATIVE
Influenza B by PCR: NEGATIVE
SARS Coronavirus 2 by RT PCR: NEGATIVE

## 2021-07-08 LAB — D-DIMER, QUANTITATIVE: D-Dimer, Quant: 0.37 ug/mL-FEU (ref 0.00–0.50)

## 2021-07-08 IMAGING — CT CT HEAD W/O CM
4 series · 17 of 47 positions shown, 19 images · non-contrast
Comparison: [DATE]

CLINICAL DATA: Headache, new or worsening (Age >= 50y)



[Series 2: head wo · axial · 0.45mm/px · z∈[+981,+1101]mm · 7 of 32 slices shown, 9 images]
[im 4/32  brain]
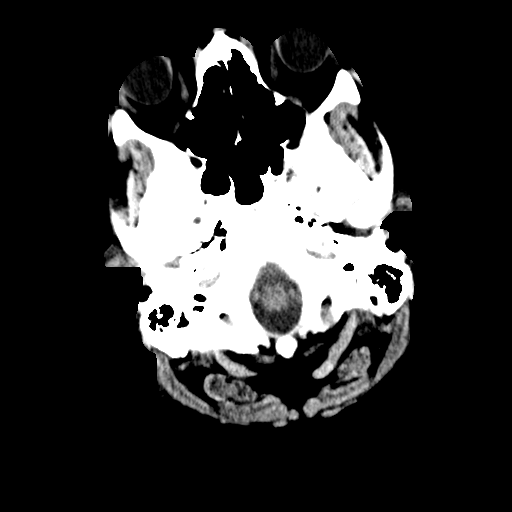
[im 4/32  bone]
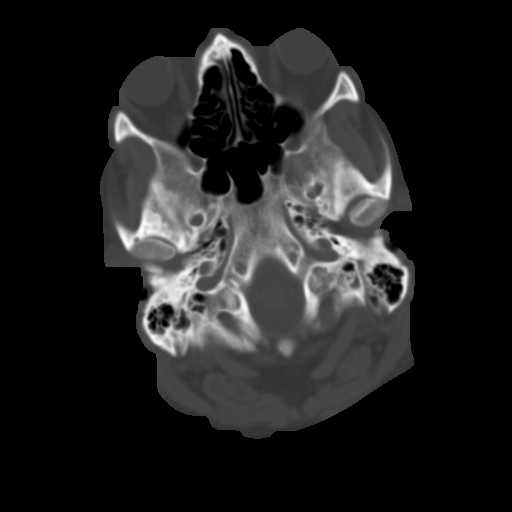
[im 8/32  brain]
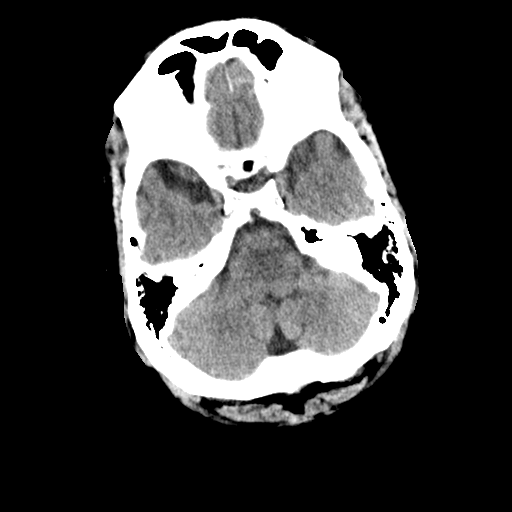
[im 12/32  brain]
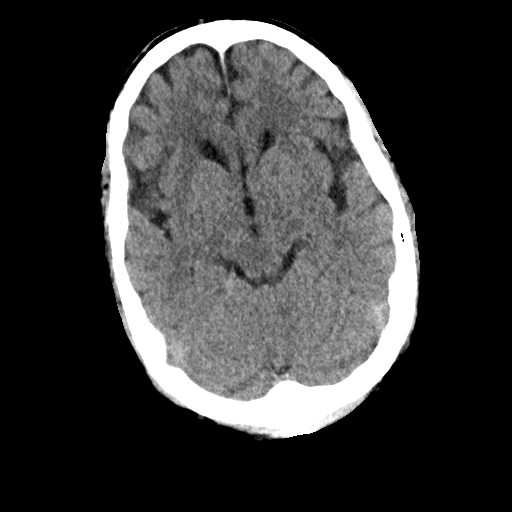
[im 16/32  brain]
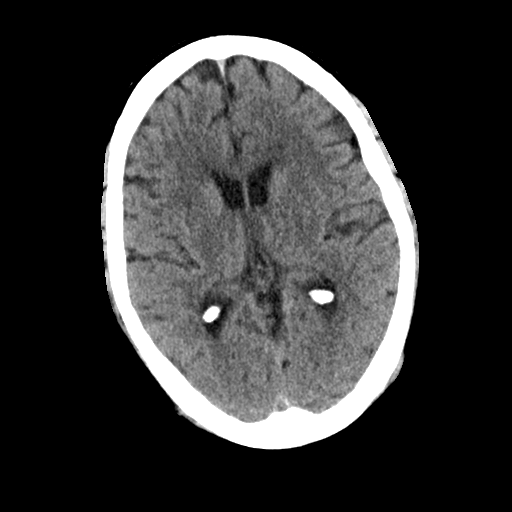
[im 20/32  brain]
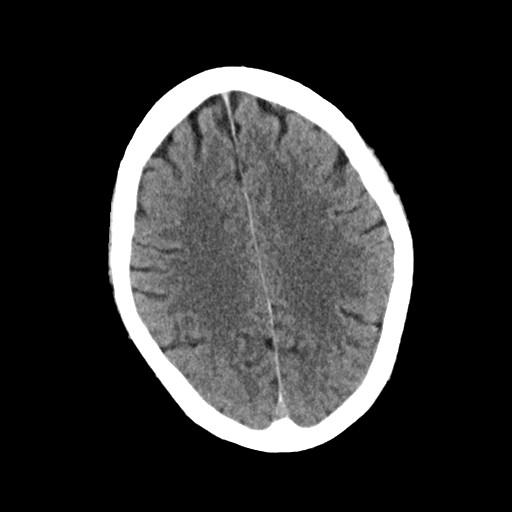
[im 20/32  bone]
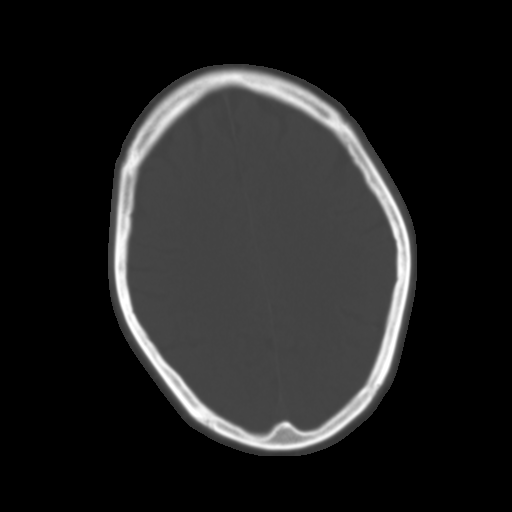
[im 24/32  brain]
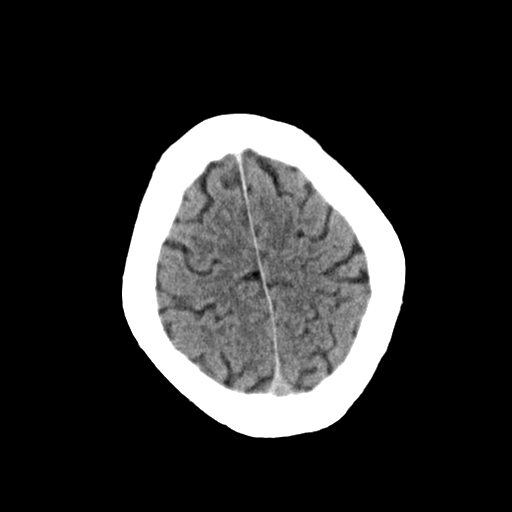
[im 28/32  brain]
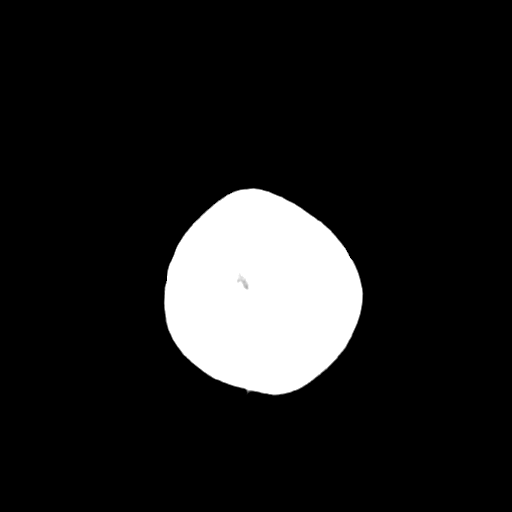

[Series 3: head bone · axial · 0.45mm/px · z∈[+980,+1036]mm · 4 of 80 slices shown]
[im 8/80  bone]
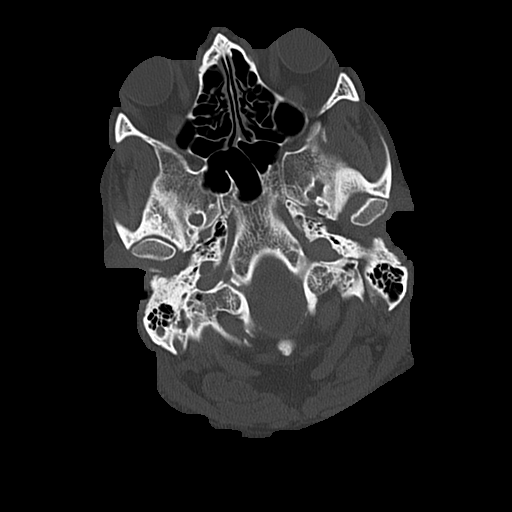
[im 16/80  bone]
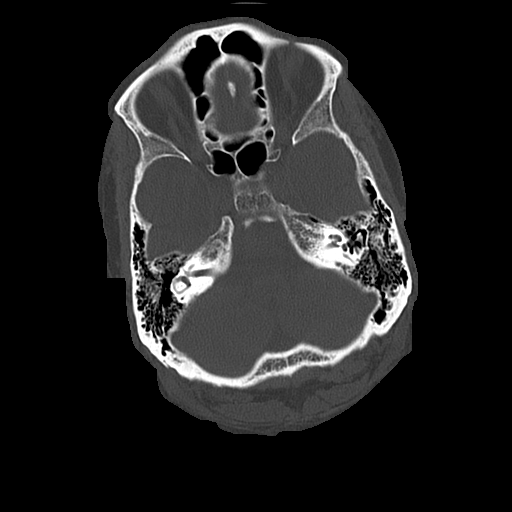
[im 24/80  bone]
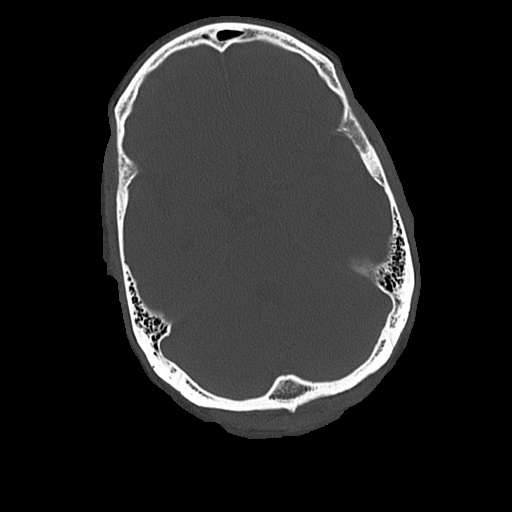
[im 36/80  bone]
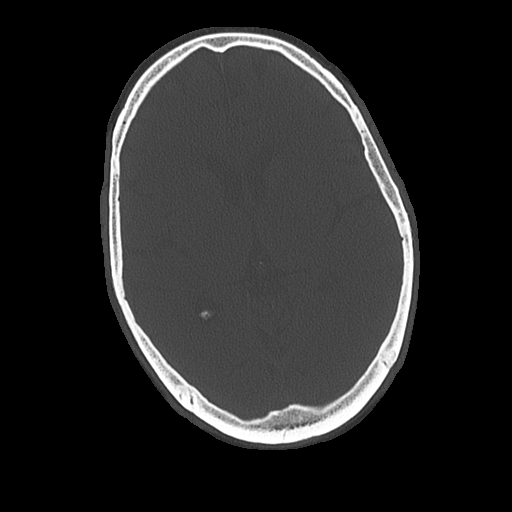

[Series 4: coronal soft · coronal · 0.34mm/px · 3 of 71 slices shown]
[im 24/71  brain]
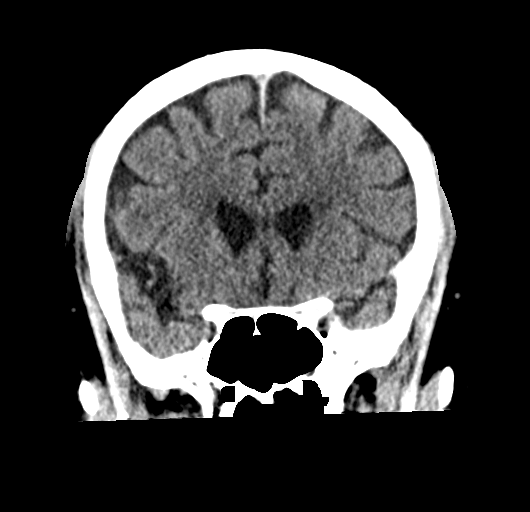
[im 32/71  brain]
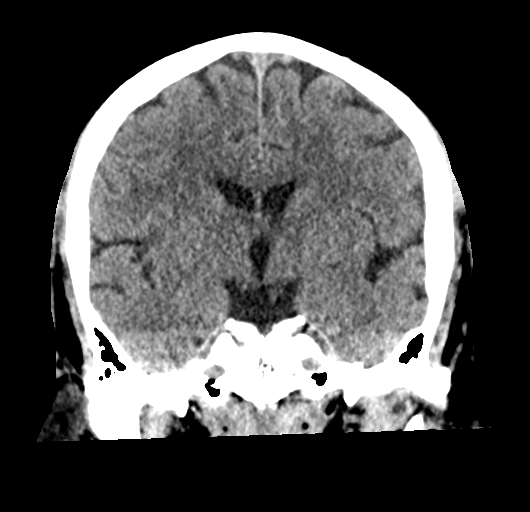
[im 39/71  brain]
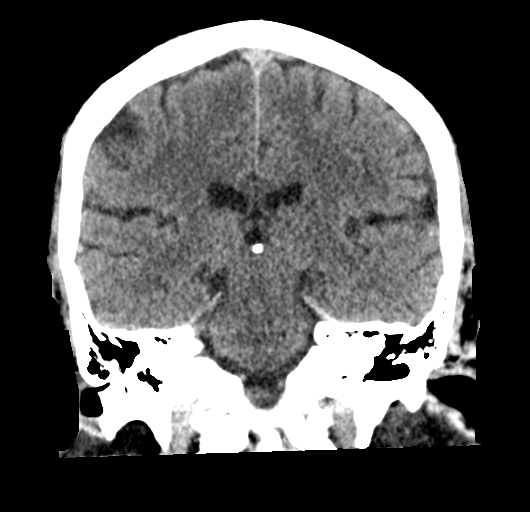

[Series 5: sagittal soft · sagittal · 0.33mm/px · 3 of 61 slices shown]
[im 21/61  brain]
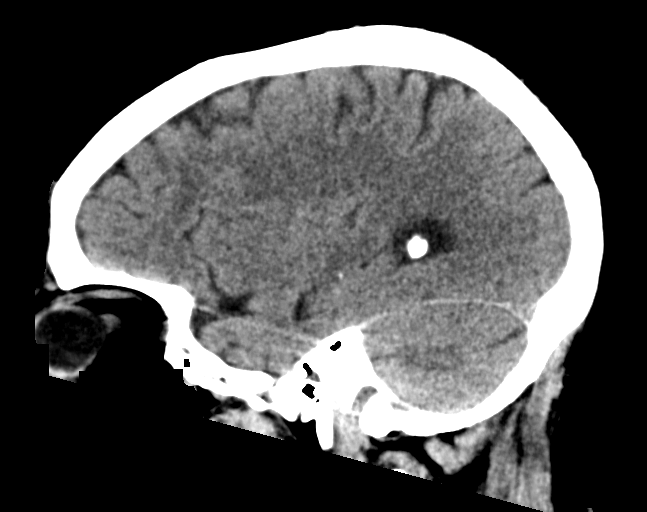
[im 31/61  brain]
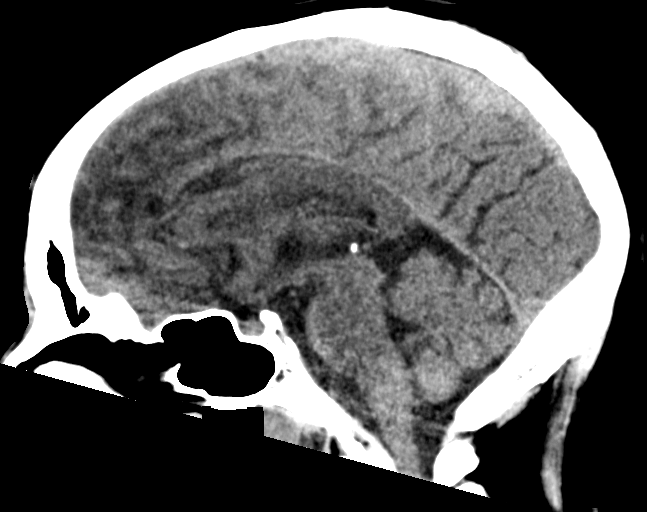
[im 41/61  brain]
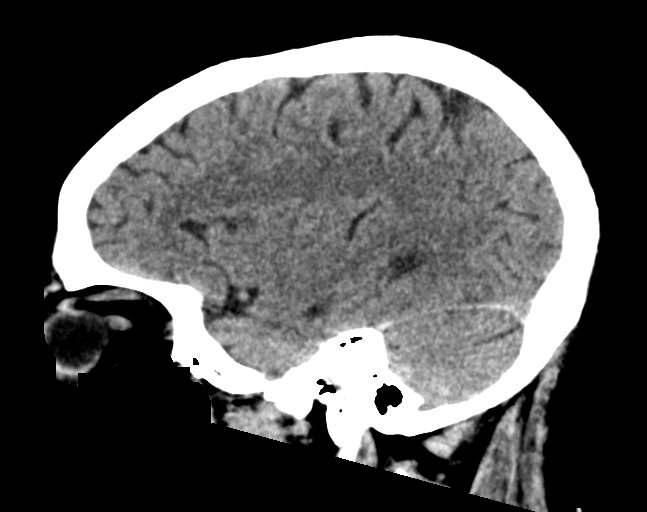

[17 of 47 positions shown; findings below may reference images not displayed]

FINDINGS: Brain: No acute intracranial abnormality. Specifically, no
hemorrhage, hydrocephalus, mass lesion, acute infarction, or
significant intracranial injury.

Vascular: No hyperdense vessel or unexpected calcification.

Skull: No acute calvarial abnormality.

Sinuses/Orbits: No acute findings

Other: None
IMPRESSION: No acute intracranial abnormality.

## 2021-07-08 IMAGING — DX DG CHEST 2V
2 series · 2 of 2 positions shown · non-contrast
Comparison: Chest CT [DATE] and chest radiographs [DATE]

CLINICAL DATA: Shortness of breath.

EXAM:
CHEST - 2 VIEW

[chest pa]
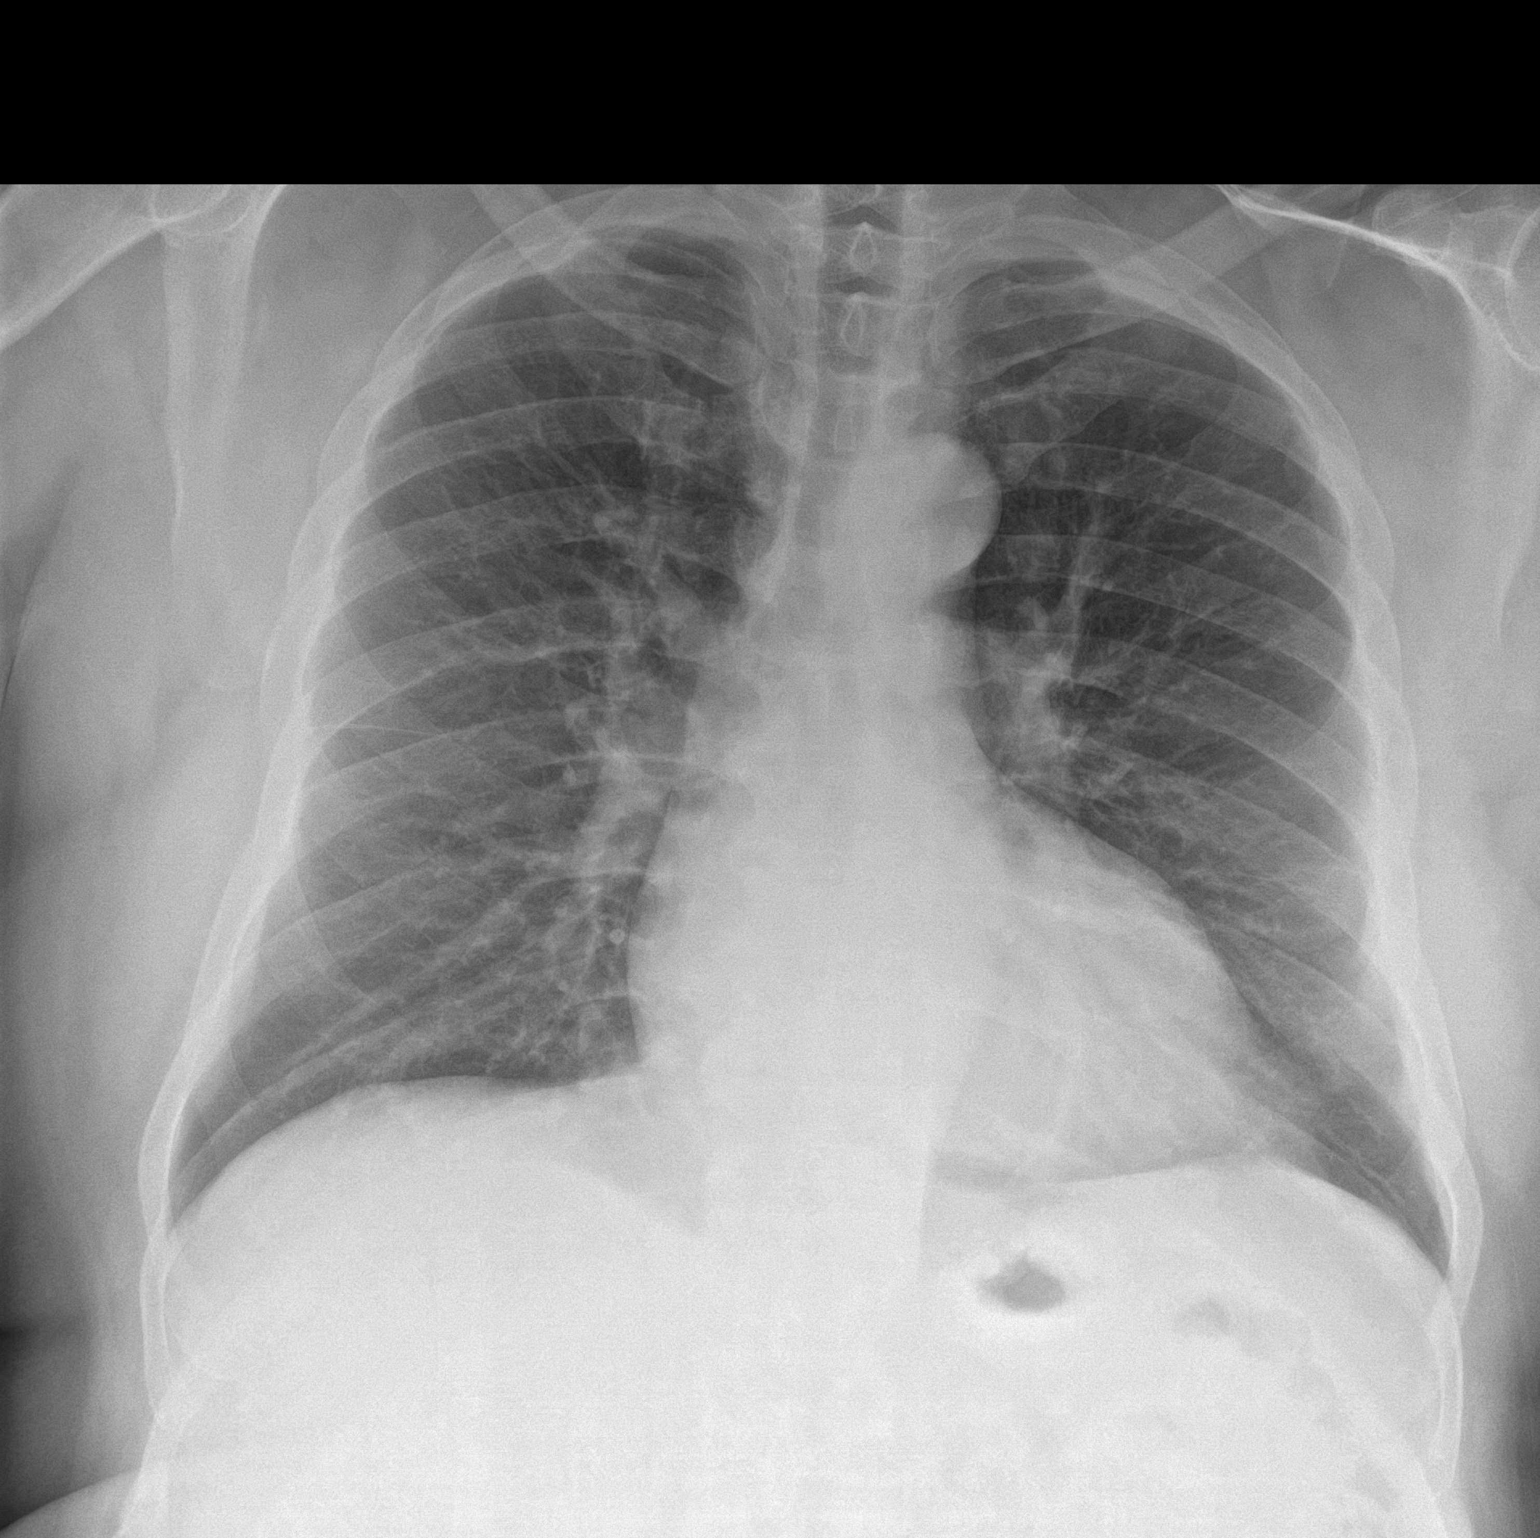

[chest lat]
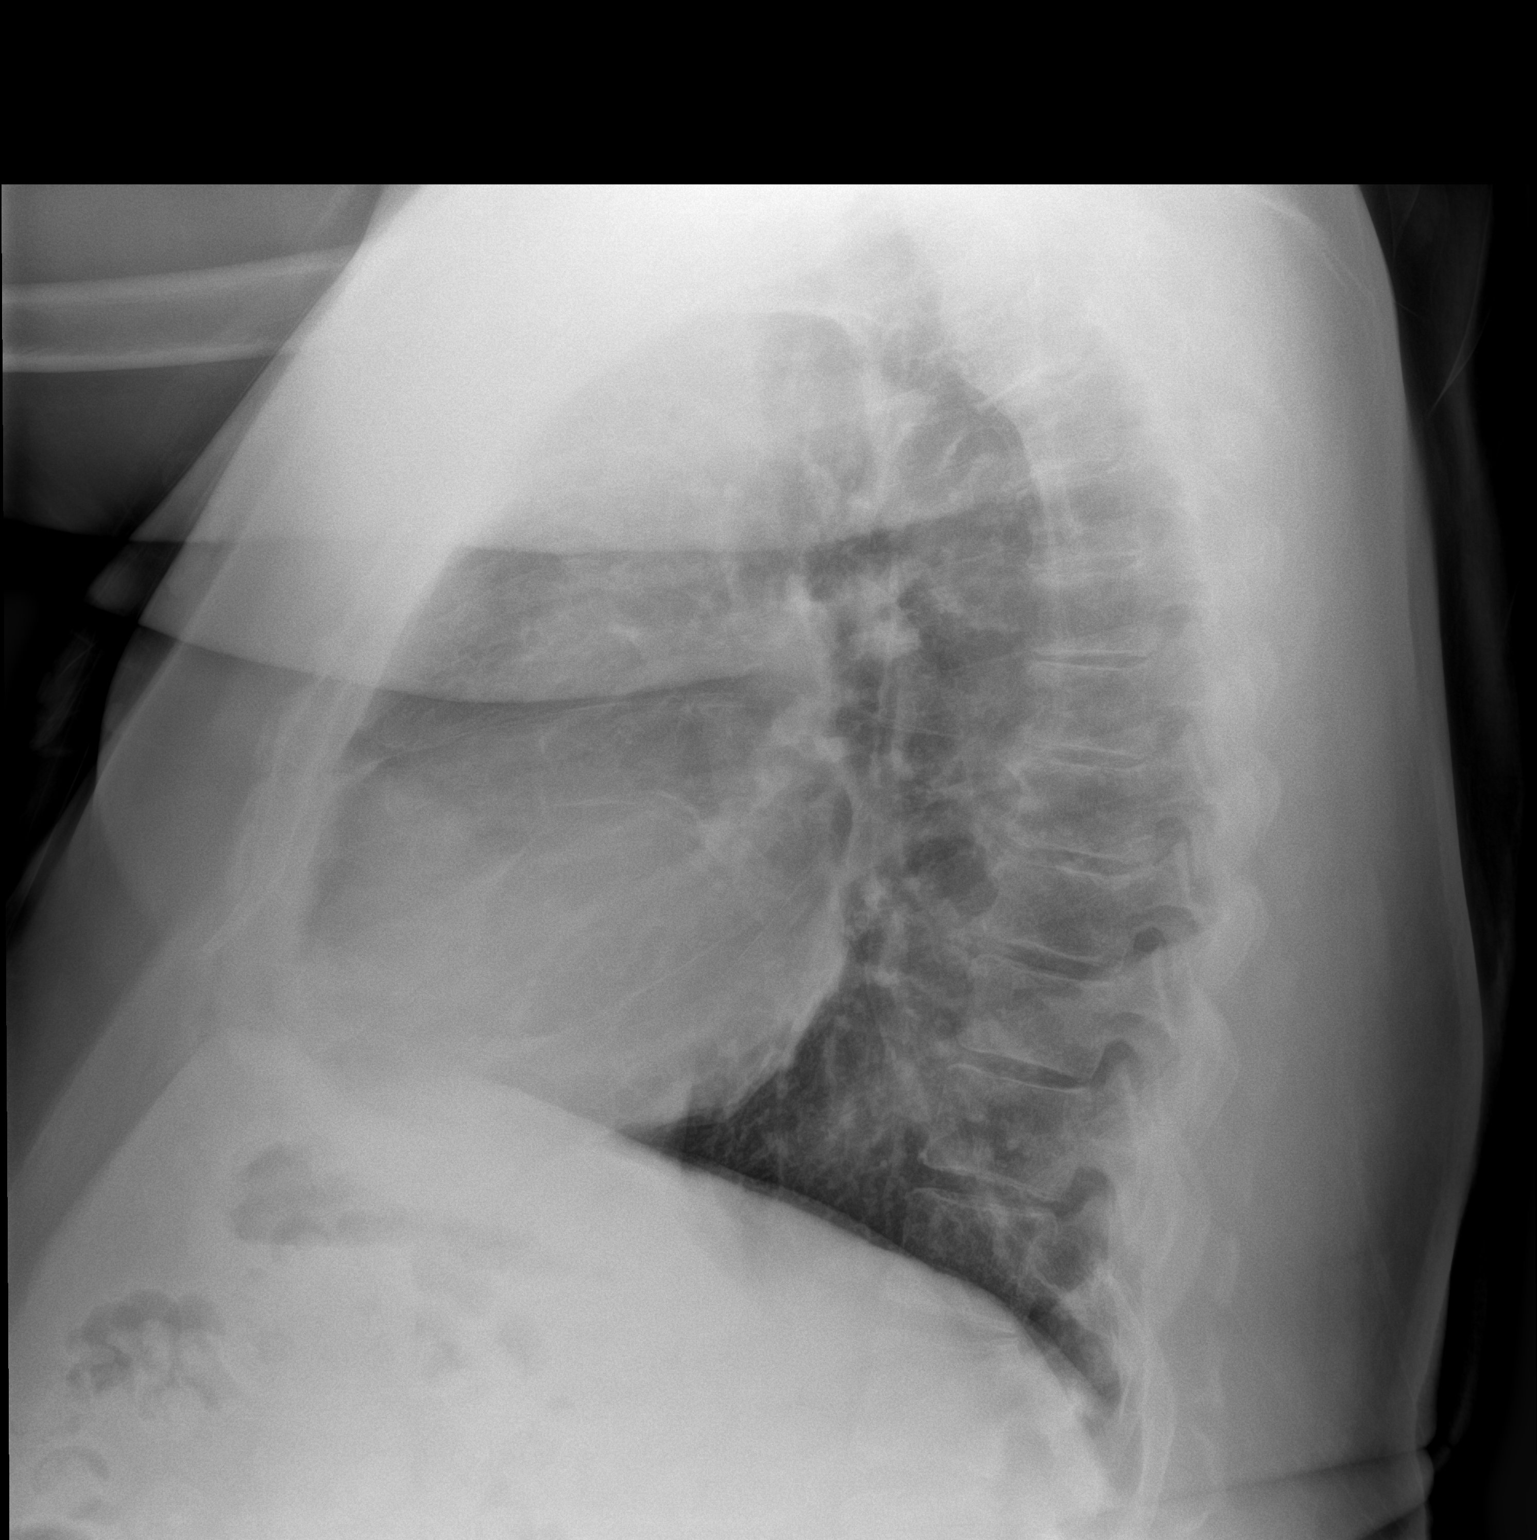

[2 of 2 positions shown; findings below may reference images not displayed]

FINDINGS: The cardiac silhouette remains upper limits of normal in size. The
lungs are mildly hyperinflated. No airspace consolidation, edema,
pleural effusion, or pneumothorax is identified. No acute osseous
abnormality is seen.
IMPRESSION: No active cardiopulmonary disease.

## 2021-07-08 MED ORDER — ACETAMINOPHEN 325 MG PO TABS: 650.0000 mg | ORAL_TABLET | Freq: Once | ORAL | Status: DC

## 2021-07-08 NOTE — Discharge Instructions (Signed)
Please go to the emergency department as soon as you leave urgent care for further evaluation and management. ?

## 2021-07-08 NOTE — ED Provider Notes (Signed)
 [facility] EMERGENCY DEPT Provider Note   CSN: [id number] Arrival date & time: 07/08/21  1556     History  Chief Complaint  Patient presents with   Shortness of Breath    Jesus [name] is a 66 y.o. male.  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  Patient reports he always has swelling in his legs due to poor circulation but does not feel that it is any worse today.  He has been eating and drinking his normal amount and denies any new abdominal distention.  He has not had significant cough or wheezing.  No chest pain.  He does not use inhalers at home.  While at rest he feels fine but reports if he is trying to cook something in the kitchen or tries to go to the grocery store that is when he starts feeling dizzy which he describes as feeling lightheaded like he might pass out and will start feeling winded.  He persists as this is only been in the last 5 days and has also had a headache every day which typically involves his whole head but today he saw some flashing lights in his right eye.  He took Tylenol  greater than 4 hours ago and reports the headaches almost completely gone and does seem to come and go.  He denies any neck pain or fever.  He has had no recent medication changes.  He did see his oncologist last month and at that time everything seemed to be okay.  At that time he was also seen in the emergency room and had negative Dopplers of his lower extremities.  The history is provided by the patient.  Shortness of Breath     Home Medications Prior to Admission medications   Medication Sig Start Date End Date Taking? Authorizing Provider  amLODipine  (NORVASC ) 10 MG tablet TAKE 1 TABLET BY MOUTH EVERY  DAY Patient taking differently: Take 10 mg by mouth every evening. 02/19/20   Benjiman Bras, MD  aspirin  EC 81 MG tablet Take 81 mg by mouth every evening. Swallow whole.    [provider]  lisinopril  (ZESTRIL ) 20 MG tablet TAKE 1 TABLET BY MOUTH EVERY DAY Patient taking differently: Take 20 mg by mouth every evening. 02/19/20   Benjiman Bras, MD  Multiple Vitamin (MULTIVITAMIN WITH MINERALS) TABS tablet Take 1 tablet by mouth every evening.    [provider]  Multiple Vitamins-Minerals (MULTI ADULT GUMMIES PO) Take 1 tablet by mouth in the morning. Centrum    [provider]  Nutritional Supplements (FRUIT & VEGETABLE DAILY PO) Take 4 tablets by mouth daily in the afternoon.    [provider]  tadalafil  (CIALIS ) 20 MG tablet Take 20 mg by mouth daily as needed for erectile dysfunction. 07/03/20   [provider]  vitamin B-12 (CYANOCOBALAMIN ) 1000 MCG tablet Take 1,000 mcg by mouth every evening.    [provider]      Allergies    Bee venom, Antifungal [miconazole nitrate], and Zolpidem tartrate er    Review of Systems   Review of Systems  Respiratory:  Positive for shortness of breath.    Physical Exam Updated Vital Signs BP 140/86   Pulse 76   Temp 98 F (36.7 C) (Oral)  Resp 18   Ht 6' (1.829 m)   Wt (!) 141.5 kg   SpO2 97%   BMI 42.31 kg/m  Physical Exam Vitals and nursing note reviewed.  Constitutional:      General: He is not in acute distress.    Appearance: He is well-developed. He is not ill-appearing.  HENT:     Head: Normocephalic and atraumatic.  Eyes:     Extraocular Movements: Extraocular movements intact.     Conjunctiva/sclera: Conjunctivae normal.     Pupils: Pupils are equal, round, and reactive to light.  Cardiovascular:     Rate and Rhythm: Normal rate and regular rhythm.     Pulses: Normal pulses.     Heart sounds: No murmur heard. Pulmonary:     Effort: Pulmonary effort is normal.  No tachypnea or respiratory distress.     Breath sounds: Normal breath sounds. No decreased breath sounds, wheezing or rales.  Abdominal:     General: There is no distension.     Palpations: Abdomen is soft.     Tenderness: There is no abdominal tenderness. There is no guarding or rebound.  Musculoskeletal:        General: No tenderness. Normal range of motion.     Cervical back: Normal range of motion and neck supple.     Right lower leg: Edema present.     Left lower leg: Edema present.     Comments: Minimal nonpitting edema present in bilateral ankles.  Multiple varicose veins present in bilateral lower extremities  Skin:    General: Skin is warm and dry.     Findings: No erythema or rash.  Neurological:     Mental Status: He is alert and oriented to person, place, and time. Mental status is at baseline.     Cranial Nerves: No cranial nerve deficit.     Sensory: No sensory deficit.     Motor: No weakness.     Gait: Gait normal.     Comments: No visual field cuts  Psychiatric:        Mood and Affect: Mood normal.        Behavior: Behavior normal.    ED Results / Procedures / Treatments   Labs (all labs ordered are listed, but only abnormal results are displayed) Labs Reviewed  BASIC METABOLIC PANEL - Abnormal; Notable for the following components:      Result Value   Glucose, Bld 181 (*)    All other components within normal limits  CBC - Abnormal; Notable for the following components:   RBC 4.09 (*)    Hemoglobin 11.9 (*)    HCT 36.6 (*)    All other components within normal limits  RESP PANEL BY RT-PCR (FLU A&B, COVID) [id number]  D-DIMER, QUANTITATIVE  TROPONIN I (HIGH SENSITIVITY)  TROPONIN I (HIGH SENSITIVITY)    EKG None  ED ECG REPORT   Date: 07/08/2021  Rate: 87  Rhythm: normal sinus rhythm  QRS Axis: normal  Intervals: normal  ST/T Wave abnormalities: nonspecific ST changes  Conduction Disutrbances:right bundle branch block  Narrative Interpretation:    Old EKG Reviewed: unchanged  I have personally reviewed the EKG tracing and agree with the computerized printout as noted.  Radiology DG Chest 2 View  Result Date: 07/08/2021 CLINICAL DATA:  Shortness of breath. EXAM: CHEST - 2 VIEW COMPARISON:  Chest CT 10/06/2020 and chest radiographs 02/11/2019 FINDINGS: The cardiac silhouette remains upper limits of normal in size. The lungs are mildly hyperinflated. No airspace  consolidation, edema, pleural effusion, or pneumothorax is identified. No acute osseous abnormality is seen. IMPRESSION: No active cardiopulmonary disease. Electronically Signed   By: Aundra Lee M.D.   On: 07/08/2021 16:34   CT Head Wo Contrast  Result Date: 07/08/2021 CLINICAL DATA:  Headache, new or worsening (Age >= 50y) EXAM: CT HEAD WITHOUT CONTRAST TECHNIQUE: Contiguous axial images were obtained from the base of the skull through the vertex without intravenous contrast. RADIATION DOSE REDUCTION: This exam was performed according to the departmental dose-optimization program which includes automated exposure control, adjustment of the mA and/or kV according to patient size and/or use of iterative reconstruction technique. COMPARISON:  05/27/2018 FINDINGS: Brain: No acute intracranial abnormality. Specifically, no hemorrhage, hydrocephalus, mass lesion, acute infarction, or significant intracranial injury. Vascular: No hyperdense vessel or unexpected calcification. Skull: No acute calvarial abnormality. Sinuses/Orbits: No acute findings Other: None IMPRESSION: No acute intracranial abnormality. Electronically Signed   By: Janeece Mechanic M.D.   On: 07/08/2021 17:54    Procedures Procedures    Medications Ordered in ED Medications  acetaminophen  (TYLENOL ) tablet 650 mg (650 mg Oral Not Given 07/08/21 1854)    ED Course/ Medical Decision Making/ A&P                           Medical Decision Making Amount and/or Complexity of Data Reviewed Labs: ordered. Decision-making  details documented in ED Course. Radiology: ordered and independent interpretation performed. Decision-making details documented in ED Course.  Risk OTC drugs.   Patient is a 66 year old male presenting today with nonspecific shortness of breath which seems to be only present with going to the grocery store prolonged standing.  Dizziness does not seem vertiginous but seems more orthostatic with prolonged standing as well.  He denies any dizziness or visual symptoms at this time reports that his headache is almost completely resolved but he has had a headache for the last 5 days.  He reports he normally does not have headaches but was seen by his oncologist last month per external medical records and at that time he was complaining of dizziness and headache that had resolved after he got on his correct blood pressure medication.  He is denying specific infectious etiology at this time.  He is well-appearing on exam and vital signs are normal.  Patient was seen in urgent care prior to this visit today and they sent him here for further evaluation given his complaints and complicated medical history.  I have independently interpreted patient's labs/EKG and his CBC, BMP, troponin are within normal limits.  Low suspicion for ACS at this time and patient is not displaying signs consistent with CHF.  EKG with a chronic right bundle branch block but no significant change since May of last year.  I independently visualized and interpreted patient's chest x-ray which shows no acute findings today.  Given patient's past medical history we will do a D-dimer to ensure we do not need to worry about PE.  Also COVID is pending  8:01 PM Patient's D-dimer, COVID are both negative.  I independently visualized and interpreted head CT which shows no sign of intracranial hemorrhage.  Radiology reported no acute findings.  Patient ambulated here and oxygen saturation remained 95% on room air.  The last echo patient had was in  2019 at that time EF was 55 to 60%.  Discussed the findings with the patient.  At this time he does not meet in need admission criteria and  feel that he is stable for discharge home but did discuss with him close follow-up with his PCP possible new echo or further pulmonary evaluation to discover why he is feeling winded with exertion.        Final Clinical Impression(s) / ED Diagnoses Final diagnoses:  DOE (dyspnea on exertion)    Rx / DC Orders ED Discharge Orders     None         Almond Army, MD 07/08/21 2002

## 2021-07-08 NOTE — Discharge Instructions (Addendum)
All the tests today look good.  No sign of heart attack, blood clots or congestive heart failure.  Your x-ray did not show any signs of pneumonia and your COVID test was negative today.  Make sure you are resting as needed when you get up to do something if you feel worn out.  It will be important to follow-up with your regular doctors as you may need more testing done to your heart and your lungs if the symptoms continue.  If you start having severe shortness of breath and it is present even when you are at rest or start having new chest pain you should return to the emergency room. ?

## 2021-07-08 NOTE — ED Notes (Signed)
Patient transported to CT 

## 2021-07-08 NOTE — ED Triage Notes (Signed)
Patient c/o feeling dizzy when standing for long periods of time and headaches.  Patient began feeling this way about 5 days ago.  No nausea or vomiting. ?

## 2021-07-08 NOTE — ED Triage Notes (Signed)
Patient here POV from Home with SOB. ? ?Endorses for 5 days he has had SOB, Dizziness, Mild Nausea, and Headaches. ? ?Was seen at [facility] today and referred to ED for Evaluation.  ? ?No CP. No Emesis. No Fevers.  ? ?A&Ox4. GCS 15. Ambulatory. NAD Noted during Triage.  ?

## 2021-07-08 NOTE — ED Provider Notes (Signed)
 [facility]    CSN: [id number] Arrival date & time: 07/08/21  1451      History   Chief Complaint Chief Complaint  Patient presents with   Dizziness    HPI Jesus [name] is a 66 y.o. male.   Patient presents with 5-day history of dizziness and headaches.  Patient reports the dizziness and headaches typically occur when he is standing for long period of time.  He denies any chest pain but does report some associated shortness of breath.  He also reports that he been having some blurry vision as well.  Denies any nausea or vomiting.  Denies any recent head trauma.  She reports that he has a heart condition where "his heart stops and starts" and is followed by cardiology.  Patient is not sure the name of this heart condition, but patient reports that he does not have a pacemaker or defibrillator.  Denies any recent fevers or upper respiratory symptoms.  Denies any diarrhea, constipation, abdominal pain, blood in stool.  Denies any urinary symptoms.   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- last radiation 06-2018   Cataract    removed both eyes with l;ens implants    Family history of colon cancer    in his brother- dx'd age 63    History of chemotherapy    last 06-01-2018   History of kidney stones    History of radiation therapy    last radiation 06-2018   Hyperlipidemia    currently under control   Hypertension    Irregular heart beats    Lymphadenopathy    Pain, lower leg    Bilateral   Peripheral arterial disease (HCC)    Pre-diabetes    Red-green color blindness    RLS (restless legs syndrome)    Snores    Wears glasses     Patient Active Problem List   Diagnosis Date Noted   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   Vocal cord mass  09/16/2020   Chronic pain of right knee 10/17/2018   Large cell (diffuse) non-Hodgkin's lymphoma (HCC) 05/10/2018   Bacteremia due to Escherichia coli    HTN (hypertension) 04/17/2018   RLS (restless legs syndrome) 04/17/2018   Abnormal LFTs 04/17/2018   Acute metabolic encephalopathy 04/16/2018   AKI (acute kidney injury) (HCC) 04/16/2018   Dehydration    Fever, unspecified    Sepsis (HCC)    Disorientation    Hypokalemia    Hypomagnesemia    Anemia    Encounter for antineoplastic chemotherapy    At high risk of tumor lysis syndrome    Swelling of lower leg    Diffuse large B cell lymphoma (HCC) 01/15/2018   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HCC) 01/12/2018   Counseling regarding advance care planning and goals of care 01/12/2018   Bilateral leg pain 05/27/2014    Past Surgical History:  Procedure Laterality Date   CATARACT EXTRACTION W/ INTRAOCULAR LENS  IMPLANT, BILATERAL     COLONOSCOPY     dislodged salava stone     FRACTURE SURGERY     HAND ARTHROPLASTY  1995   crushed left hand   INGUINAL LYMPH NODE BIOPSY  Left 01/02/2018   Procedure: LEFT INGUINAL LYMPH NODE BIOPSY;  Surgeon: Enid Harry, MD;  Location: [facility] OR;  Service: General;  Laterality: Left;   IR IMAGING GUIDED PORT INSERTION  01/15/2018   IR REMOVAL TUN ACCESS W/ PORT W/O FL MOD SED  03/11/2019   MICROLARYNGOSCOPY Left 01/17/2014   Procedure: MICROLARYNGOSCOPY WITH EXCISION OF THE BIOPSY OF LEFT VOCAL CORD LESION;  Surgeon: Janita Mellow, MD;  Location: [facility];  Service: ENT;  Laterality: Left;   MICROLARYNGOSCOPY N/A 09/16/2020   Procedure: MICROLARYNGOSCOPY with Biopsy of vocal cord lesion;  Surgeon: Ammon Bales, MD;  Location: [facility] OR;  Service: ENT;  Laterality: N/A;   ORIF FOOT FRACTURE  2005   left   REFRACTIVE SURGERY Right    removed cloudiness in right eye after cataract removal        Home Medications    Prior to Admission medications    Medication Sig Start Date End Date Taking? Authorizing Provider  amLODipine  (NORVASC ) 10 MG tablet TAKE 1 TABLET BY MOUTH EVERY DAY Patient taking differently: Take 10 mg by mouth every evening. 02/19/20  Yes Benjiman Bras, MD  aspirin  EC 81 MG tablet Take 81 mg by mouth every evening. Swallow whole.   Yes [provider]  lisinopril  (ZESTRIL ) 20 MG tablet TAKE 1 TABLET BY MOUTH EVERY DAY Patient taking differently: Take 20 mg by mouth every evening. 02/19/20  Yes Benjiman Bras, MD  Multiple Vitamin (MULTIVITAMIN WITH MINERALS) TABS tablet Take 1 tablet by mouth every evening.   Yes [provider]  Multiple Vitamins-Minerals (MULTI ADULT GUMMIES PO) Take 1 tablet by mouth in the morning. Centrum   Yes [provider]  Nutritional Supplements (FRUIT & VEGETABLE DAILY PO) Take 4 tablets by mouth daily in the afternoon.   Yes [provider]  tadalafil  (CIALIS ) 20 MG tablet Take 20 mg by mouth daily as needed for erectile dysfunction. 07/03/20  Yes [provider]  vitamin B-12 (CYANOCOBALAMIN ) 1000 MCG tablet Take 1,000 mcg by mouth every evening.   Yes [provider]    Family History Family History  Problem Relation Age of Onset   Breast cancer Mother    Diabetes Father    Hypertension Father    Stroke Father    Mental illness Sister    Hypertension Daughter    Mental illness Daughter    Hypertension Brother    Colon cancer Brother 22       passed away 12/02/2018   Breast cancer Sister    Esophageal cancer Neg Hx    Colon polyps Neg Hx    Rectal cancer Neg Hx    Stomach cancer Neg Hx     Social History Social History   Tobacco Use   Smoking status: Former    Packs/day: 0.50    Years: 36.00    Pack years: 18.00    Types: Cigarettes    Quit date: 09/28/2020    Years since quitting: 0.7   Smokeless tobacco: Never   Tobacco comments:    he denies smoking in about 2 weeks 07/13/18  Vaping Use   Vaping Use: Never used   Substance Use Topics   Alcohol use: Yes    Alcohol/week: 6.0 standard drinks    Types: 6 Cans of beer per week    Comment: weekends   Drug use: Yes    Types: Marijuana    Comment: reports cocaine usage ~2X/ month; last use 12/26/17  Allergies   Bee venom, Antifungal [miconazole nitrate], and Zolpidem tartrate er   Review of Systems Review of Systems Per HPI  Physical Exam Triage Vital Signs ED Triage Vitals  Enc Vitals Group     BP 07/08/21 1503 (!) 149/88     Pulse Rate 07/08/21 1503 79     Resp 07/08/21 1503 20     Temp 07/08/21 1503 98 F (36.7 C)     Temp Source 07/08/21 1503 Oral     SpO2 07/08/21 1503 91 %     Weight 07/08/21 1504 (!) 312 lb (141.5 kg)     Height 07/08/21 1504 6' (1.829 m)     Head Circumference --      Peak Flow --      Pain Score 07/08/21 1504 5     Pain Loc --      Pain Edu? --      Excl. in GC? --    No data found.  Updated Vital Signs BP (!) 149/88 (BP Location: Right Arm)   Pulse 79   Temp 98 F (36.7 C) (Oral)   Resp 20   Ht 6' (1.829 m)   Wt (!) 312 lb (141.5 kg)   SpO2 91%   BMI 42.31 kg/m   Visual Acuity Right Eye Distance:   Left Eye Distance:   Bilateral Distance:    Right Eye Near:   Left Eye Near:    Bilateral Near:     Physical Exam Constitutional:      General: He is not in acute distress.    Appearance: Normal appearance. He is not toxic-appearing or diaphoretic.  HENT:     Head: Normocephalic and atraumatic.     Right Ear: Tympanic membrane and ear canal normal.     Left Ear: Tympanic membrane and ear canal normal.  Eyes:     Extraocular Movements: Extraocular movements intact.     Conjunctiva/sclera: Conjunctivae normal.     Pupils: Pupils are equal, round, and reactive to light.  Cardiovascular:     Rate and Rhythm: Normal rate and regular rhythm.     Pulses: Normal pulses.     Heart sounds: Normal heart sounds.  Pulmonary:     Effort: Pulmonary effort is normal. No respiratory distress.      Breath sounds: Normal breath sounds.  Abdominal:     General: Bowel sounds are normal. There is no distension.     Palpations: Abdomen is soft.     Tenderness: There is no abdominal tenderness.  Skin:    General: Skin is warm and dry.  Neurological:     General: No focal deficit present.     Mental Status: He is alert and oriented to person, place, and time. Mental status is at baseline.  Psychiatric:        Mood and Affect: Mood normal.        Behavior: Behavior normal.        Thought Content: Thought content normal.        Judgment: Judgment normal.     UC Treatments / Results  Labs (all labs ordered are listed, but only abnormal results are displayed) Labs Reviewed - No data to display  EKG   Radiology No results found.  Procedures Procedures (including critical care time)  Medications Ordered in UC Medications - No data to display  Initial Impression / Assessment and Plan / UC Course  I have reviewed the triage vital signs and the nursing notes.  Pertinent labs &  imaging results that were available during my care of the patient were reviewed by me and considered in my medical decision making (see chart for details).     EKG was fairly unremarkable when compared to previous EKGs.  Patient's oxygen saturation is ranging from 91 to 97%.  After further review of the chart, I was unable to determine the heart condition that patient was referring to during history intake.  Due to history of cardiac problems as well as associated dizziness and shortness of breath as well as low oxygen saturation at times, patient was advised to go to the hospital for further evaluation and management.  Patient was agreeable with plan.  Vital signs were stable prior to discharge.  Patient left via self transport. Final Clinical Impressions(s) / UC Diagnoses   Final diagnoses:  Dizziness and giddiness  Acute nonintractable headache, unspecified headache type     Discharge Instructions       Please go to the emergency department as soon as you leave urgent care for further evaluation and management.    ED Prescriptions   None    PDMP not reviewed this encounter.   Dodson Freestone, [place] 07/08/21 1538

## 2021-07-08 NOTE — ED Notes (Signed)
Pt amubulated in hallway. 95% unlabored. Pt's HR got up between 15-125, however Pt stated that he felt fine ?

## 2021-07-15 ENCOUNTER — Encounter: Payer: Self-pay | Admitting: Podiatry

## 2021-07-15 ENCOUNTER — Ambulatory Visit (INDEPENDENT_AMBULATORY_CARE_PROVIDER_SITE_OTHER): Payer: No Typology Code available for payment source | Admitting: Podiatry

## 2021-07-15 ENCOUNTER — Other Ambulatory Visit: Payer: Self-pay

## 2021-07-15 DIAGNOSIS — Z8579 Personal history of other malignant neoplasms of lymphoid, hematopoietic and related tissues: Secondary | ICD-10-CM | POA: Insufficient documentation

## 2021-07-15 DIAGNOSIS — F17200 Nicotine dependence, unspecified, uncomplicated: Secondary | ICD-10-CM | POA: Insufficient documentation

## 2021-07-15 DIAGNOSIS — E119 Type 2 diabetes mellitus without complications: Secondary | ICD-10-CM | POA: Insufficient documentation

## 2021-07-15 DIAGNOSIS — K219 Gastro-esophageal reflux disease without esophagitis: Secondary | ICD-10-CM | POA: Insufficient documentation

## 2021-07-15 DIAGNOSIS — F5101 Primary insomnia: Secondary | ICD-10-CM | POA: Insufficient documentation

## 2021-07-15 DIAGNOSIS — R7303 Prediabetes: Secondary | ICD-10-CM | POA: Insufficient documentation

## 2021-07-15 DIAGNOSIS — C8330 Diffuse large B-cell lymphoma, unspecified site: Secondary | ICD-10-CM | POA: Insufficient documentation

## 2021-07-15 DIAGNOSIS — B351 Tinea unguium: Secondary | ICD-10-CM

## 2021-07-15 DIAGNOSIS — M79676 Pain in unspecified toe(s): Secondary | ICD-10-CM | POA: Diagnosis not present

## 2021-07-15 DIAGNOSIS — Z8572 Personal history of non-Hodgkin lymphomas: Secondary | ICD-10-CM | POA: Insufficient documentation

## 2021-07-15 DIAGNOSIS — E782 Mixed hyperlipidemia: Secondary | ICD-10-CM | POA: Insufficient documentation

## 2021-07-15 DIAGNOSIS — G8929 Other chronic pain: Secondary | ICD-10-CM | POA: Insufficient documentation

## 2021-07-15 DIAGNOSIS — D171 Benign lipomatous neoplasm of skin and subcutaneous tissue of trunk: Secondary | ICD-10-CM | POA: Insufficient documentation

## 2021-07-15 DIAGNOSIS — N529 Male erectile dysfunction, unspecified: Secondary | ICD-10-CM | POA: Insufficient documentation

## 2021-07-15 NOTE — Progress Notes (Signed)
He presents today for a chief complaint of painful ingrown toenails to the hallux bilaterally states that he would like to consider having a procedure done sometime later this month when his nails grow back but he is got a consult to go to tomorrow. ? ?Objective: Vital signs stable alert oriented x3 pulses are palpable bilateral.  Neurologic sensorium is intact Deetjen reflex are intact motor strength normal symmetrical bilateral.  Short-term rated nail margin of the hallux bilateral with no erythema edema cellulitis drainage or odor.  Tender on palpation. ? ?Assessment: Pain in limb secondary to ingrown toenails with nail dystrophy possibly onychomycosis. ? ?Plan: Debridement of toenails hallux bilateral. ?

## 2021-07-28 DIAGNOSIS — R42 Dizziness and giddiness: Secondary | ICD-10-CM | POA: Diagnosis not present

## 2021-08-03 DIAGNOSIS — Z87891 Personal history of nicotine dependence: Secondary | ICD-10-CM | POA: Diagnosis not present

## 2021-08-03 DIAGNOSIS — J3489 Other specified disorders of nose and nasal sinuses: Secondary | ICD-10-CM | POA: Diagnosis not present

## 2021-08-03 DIAGNOSIS — Z8521 Personal history of malignant neoplasm of larynx: Secondary | ICD-10-CM | POA: Diagnosis not present

## 2021-08-03 DIAGNOSIS — Z923 Personal history of irradiation: Secondary | ICD-10-CM | POA: Diagnosis not present

## 2021-08-03 DIAGNOSIS — R490 Dysphonia: Secondary | ICD-10-CM | POA: Diagnosis not present

## 2021-08-23 NOTE — Progress Notes (Signed)
 CARDIOLOGY CONSULT NOTE       Patient ID: Jesus [name] MRN: [id number] DOB/AGE: [birth year] 66 y.o.  Admit date: (Not on file) Referring Physician: Raquel Cables Primary Physician: Elyn Han, MD Primary Cardiologist: New Reason for Consultation: Episodic Lightheadedness    HPI:  66 y.o. referred by Dr Raquel Cables for episodic Lightheadedness History of smoking. History of NHL with chemo and XRT 2020 Seen in ED 07/08/21 for dizziness, Telemetry ECG unremarkable D dimer negative, CXR and head CT NAD. No vertiginous symptoms but gets dizzy with prolonged standing He is obese weighing 323 lbs. He takes amlodipine  and lisinoprol for HTN  Symptoms intermittent weekly not associated with chest pain dyspnea or palpitations no frank syncope Associated dyspnea and headaches TTE done 2019 EF was normal 55-60%   He has been ok since ER. He has gained about 25 lbs since quitting smoking in May He has some exertional dyspnea with no chest pain Rare palpitations He has not had recent sleep study and I encouraged him to f/u with primary for one to r/o OSA. 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- last radiation 06-2018   Cataract    removed both eyes with l;ens implants    Family history of colon cancer    in his brother- dx'd age 33    History of chemotherapy    last 06-01-2018   History of kidney stones    History of radiation therapy    last radiation 06-2018   Hyperlipidemia    currently under control   Hypertension    Irregular heart beats    Lymphadenopathy    Pain, lower leg    Bilateral   Peripheral arterial disease (HCC)     Pre-diabetes    Red-green color blindness    RLS (restless legs syndrome)    Snores    Wears glasses     Family History  Problem Relation Age of Onset   Breast cancer Mother    Diabetes Father    Hypertension Father    Stroke Father    Mental illness Sister    Hypertension Daughter    Mental illness Daughter    Hypertension Brother    Colon cancer Brother 23       passed away 12/03/18   Breast cancer Sister    Esophageal cancer Neg Hx    Colon polyps Neg Hx    Rectal cancer Neg Hx    Stomach cancer Neg Hx     Social History   Socioeconomic History   Marital status: Divorced    Spouse name: Not on file   Number of children: 3   Years of education: Not on file   Highest education level: Not on file  Occupational History   Not on file  Tobacco Use   Smoking status: Former    Packs/day: 0.50    Years: 36.00    Pack years: 18.00    Types: Cigarettes    Quit date: 09/28/2020    Years since quitting: 0.9   Smokeless tobacco: Never  Tobacco comments:    he denies smoking in about 2 weeks 07/13/18  Vaping Use   Vaping Use: Never used  Substance and Sexual Activity   Alcohol use: Yes    Alcohol/week: 6.0 standard drinks    Types: 6 Cans of beer per week    Comment: weekends   Drug use: Yes    Types: Marijuana    Comment: reports cocaine usage ~2X/ month; last use 12/26/17   Sexual activity: Not on file  Other Topics Concern   Not on file  Social History Narrative   Not on file   Social Determinants of Health   Financial Resource Strain: Low Risk    Difficulty of Paying Living Expenses: Not very hard  Food Insecurity: No Food Insecurity   Worried About Programme researcher, broadcasting/film/video in the Last Year: Never true   Ran Out of Food in the Last Year: Never true  Transportation Needs: No Transportation Needs   Lack of Transportation (Medical): No   Lack of Transportation (Non-Medical): No  Physical Activity: Not on file  Stress: Not on file  Social Connections: Moderately  Integrated   Frequency of Communication with Friends and Family: Three times a week   Frequency of Social Gatherings with Friends and Family: Three times a week   Attends Religious Services: 1 to 4 times per year   Active Member of Clubs or Organizations: Yes   Attends Banker Meetings: 1 to 4 times per year   Marital Status: Divorced  Catering manager Violence: Not on file    Past Surgical History:  Procedure Laterality Date   CATARACT EXTRACTION W/ INTRAOCULAR LENS  IMPLANT, BILATERAL     COLONOSCOPY     dislodged salava stone     FRACTURE SURGERY     HAND ARTHROPLASTY  1995   crushed left hand   INGUINAL LYMPH NODE BIOPSY Left 01/02/2018   Procedure: LEFT INGUINAL LYMPH NODE BIOPSY;  Surgeon: Enid Harry, MD;  Location: [facility] OR;  Service: General;  Laterality: Left;   IR IMAGING GUIDED PORT INSERTION  01/15/2018   IR REMOVAL TUN ACCESS W/ PORT W/O FL MOD SED  03/11/2019   MICROLARYNGOSCOPY Left 01/17/2014   Procedure: MICROLARYNGOSCOPY WITH EXCISION OF THE BIOPSY OF LEFT VOCAL CORD LESION;  Surgeon: Janita Mellow, MD;  Location: [facility];  Service: ENT;  Laterality: Left;   MICROLARYNGOSCOPY N/A 09/16/2020   Procedure: MICROLARYNGOSCOPY with Biopsy of vocal cord lesion;  Surgeon: Ammon Bales, MD;  Location: [facility];  Service: ENT;  Laterality: N/A;   ORIF FOOT FRACTURE  2005   left   REFRACTIVE SURGERY Right    removed cloudiness in right eye after cataract removal       Current Outpatient Medications:    amLODipine  (NORVASC ) 10 MG tablet, TAKE 1 TABLET BY MOUTH EVERY DAY (Patient taking differently: Take 10 mg by mouth every evening.), Disp: 30 tablet, Rfl: 0   aspirin  EC 81 MG tablet, Take 81 mg by mouth every evening. Swallow whole., Disp: , Rfl:    lisinopril  (ZESTRIL ) 20 MG tablet, TAKE 1 TABLET BY MOUTH EVERY DAY (Patient taking differently: Take 20 mg by mouth every evening.), Disp: 30 tablet, Rfl: 0   Multiple Vitamins-Minerals (MULTI ADULT  GUMMIES PO), Take 1 tablet by mouth in the morning. Centrum, Disp: , Rfl:    Nutritional Supplements (FRUIT & VEGETABLE DAILY PO), Take 4 tablets by mouth daily in the afternoon., Disp: , Rfl:    tadalafil  (CIALIS ) 20  MG tablet, Take 20 mg by mouth daily as needed for erectile dysfunction., Disp: , Rfl:    Multiple Vitamin (MULTIVITAMIN WITH MINERALS) TABS tablet, Take 1 tablet by mouth every evening. (Patient not taking: Reported on 08/27/2021), Disp: , Rfl:    vitamin B-12 (CYANOCOBALAMIN ) 1000 MCG tablet, Take 1,000 mcg by mouth every evening. (Patient not taking: Reported on 08/27/2021), Disp: , Rfl:     Physical Exam: There were no vitals taken for this visit.    Affect appropriate Obese male  HEENT: normal Neck supple with no adenopathy JVP normal no bruits no thyromegaly Lungs clear with no wheezing and good diaphragmatic motion Heart:  S1/S2 no murmur, no rub, gallop or click PMI normal Abdomen: benighn, BS positve, no tenderness, no AAA no bruit.  No HSM or HJR Distal pulses intact with no bruits No edema Neuro non-focal Skin warm and dry No muscular weakness  Labs:   Lab Results  Component Value Date   WBC 5.8 07/08/2021   HGB 11.9 (L) 07/08/2021   HCT 36.6 (L) 07/08/2021   MCV 89.5 07/08/2021   PLT 300 07/08/2021   No results for input(s): NA, K, CL, CO2, BUN, CREATININE, CALCIUM , PROT, BILITOT, ALKPHOS, ALT, AST, GLUCOSE in the last 168 hours.  Invalid input(s): LABALBU No results found for: CKTOTAL, CKMB, CKMBINDEX, TROPONINI  Lab Results  Component Value Date   CHOL 124 08/21/2019   CHOL 112 12/14/2018   Lab Results  Component Value Date   HDL 32 (L) 08/21/2019   HDL 35 (L) 12/14/2018   Lab Results  Component Value Date   LDLCALC 43 08/21/2019   LDLCALC 38 12/14/2018   Lab Results  Component Value Date   TRIG 326 (H) 08/21/2019   TRIG 195 (H) 12/14/2018   Lab Results  Component Value Date   CHOLHDL 3.9 08/21/2019   CHOLHDL 3.2 12/14/2018    No results found for: LDLDIRECT    Radiology: No results found.  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 Not postural BP ok  CT head ok 07/08/21 Given ECG with bi fasicular block will need yearly ECG.  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

## 2021-08-27 ENCOUNTER — Encounter: Payer: Self-pay | Admitting: Cardiovascular Disease

## 2021-08-27 ENCOUNTER — Ambulatory Visit (INDEPENDENT_AMBULATORY_CARE_PROVIDER_SITE_OTHER): Payer: No Typology Code available for payment source | Admitting: Cardiovascular Disease

## 2021-08-27 VITALS — BP 126/82 | HR 96 | Ht 72.0 in | Wt 324.0 lb

## 2021-08-27 DIAGNOSIS — R600 Localized edema: Secondary | ICD-10-CM

## 2021-08-27 DIAGNOSIS — Z9189 Other specified personal risk factors, not elsewhere classified: Secondary | ICD-10-CM | POA: Diagnosis not present

## 2021-08-27 DIAGNOSIS — R609 Edema, unspecified: Secondary | ICD-10-CM | POA: Diagnosis not present

## 2021-08-27 DIAGNOSIS — E782 Mixed hyperlipidemia: Secondary | ICD-10-CM | POA: Diagnosis not present

## 2021-08-27 DIAGNOSIS — R42 Dizziness and giddiness: Secondary | ICD-10-CM

## 2021-08-27 DIAGNOSIS — I10 Essential (primary) hypertension: Secondary | ICD-10-CM | POA: Diagnosis not present

## 2021-08-27 NOTE — Patient Instructions (Addendum)
Medication Instructions:  ?Your physician recommends that you continue on your current medications as directed. Please refer to the Current Medication list given to you today. ? ?*If you need a refill on your cardiac medications before your next appointment, please call your pharmacy* ? ?Lab Work: ?If you have labs (blood work) drawn today and your tests are completely normal, you will receive your results only by: ?MyChart Message (if you have MyChart) OR ?A paper copy in the mail ?If you have any lab test that is abnormal or we need to change your treatment, we will call you to review the results. ? ?Testing/Procedures: ?Your physician has requested that you have an echocardiogram. Echocardiography is a painless test that uses sound waves to create images of your heart. It provides your doctor with information about the size and shape of your heart and how well your heart?s chambers and valves are working. This procedure takes approximately one hour. There are no restrictions for this procedure. ? ?Your physician has requested that you have a lexiscan myoview. For further information please visit [email or web address]. Please follow instruction sheet, as given. ? ?Follow-Up: ?At [facility], you and your health needs are our priority.  As part of our continuing mission to provide you with exceptional heart care, we have created designated Provider Care Teams.  These Care Teams include your primary Cardiologist (physician) and Advanced Practice Providers (APPs -  Physician Assistants and Nurse Practitioners) who all work together to provide you with the care you need, when you need it. ? ?We recommend signing up for the patient portal called "MyChart".  Sign up information is provided on this After Visit Summary.  MyChart is used to connect with patients for Virtual Visits (Telemedicine).  Patients are able to view lab/test results, encounter notes, upcoming appointments, etc.  Non-urgent messages can be sent to  your provider as well.   ?To learn more about what you can do with MyChart, go to [email or web address].   ? ?Your next appointment:   ?1 year ? ?The format for your next appointment:   ?In Person ? ?Provider:   ?Jenkins Rouge, MD { ? ? ?Important Information About Sugar ? ? ? ? ?  ?

## 2021-09-14 ENCOUNTER — Telehealth (HOSPITAL_COMMUNITY): Payer: Self-pay | Admitting: *Deleted

## 2021-09-14 NOTE — Telephone Encounter (Signed)
Left message on voicemail per DPR in reference to upcoming appointment scheduled on 09/20/2021 at 1:00 with detailed instructions given per Myocardial Perfusion Study Information Sheet for the test. LM to arrive 15 minutes early, and that it is imperative to arrive on time for appointment to keep from having the test rescheduled. If you need to cancel or reschedule your appointment, please call the office within 24 hours of your appointment. Failure to do so may result in a cancellation of your appointment, and a $50 no show fee. Phone number given for call back for any questions.  ? ?

## 2021-09-16 ENCOUNTER — Encounter: Payer: Self-pay | Admitting: Hematology

## 2021-09-20 ENCOUNTER — Ambulatory Visit (HOSPITAL_BASED_OUTPATIENT_CLINIC_OR_DEPARTMENT_OTHER): Payer: No Typology Code available for payment source

## 2021-09-20 ENCOUNTER — Ambulatory Visit (HOSPITAL_COMMUNITY): Payer: No Typology Code available for payment source | Attending: Cardiovascular Disease

## 2021-09-20 DIAGNOSIS — R609 Edema, unspecified: Secondary | ICD-10-CM

## 2021-09-20 DIAGNOSIS — I119 Hypertensive heart disease without heart failure: Secondary | ICD-10-CM | POA: Insufficient documentation

## 2021-09-20 DIAGNOSIS — I2510 Atherosclerotic heart disease of native coronary artery without angina pectoris: Secondary | ICD-10-CM | POA: Diagnosis not present

## 2021-09-20 DIAGNOSIS — R42 Dizziness and giddiness: Secondary | ICD-10-CM

## 2021-09-20 DIAGNOSIS — Z9189 Other specified personal risk factors, not elsewhere classified: Secondary | ICD-10-CM

## 2021-09-20 DIAGNOSIS — R0609 Other forms of dyspnea: Secondary | ICD-10-CM | POA: Insufficient documentation

## 2021-09-20 DIAGNOSIS — E782 Mixed hyperlipidemia: Secondary | ICD-10-CM

## 2021-09-20 DIAGNOSIS — Z7982 Long term (current) use of aspirin: Secondary | ICD-10-CM | POA: Diagnosis not present

## 2021-09-20 DIAGNOSIS — Z87891 Personal history of nicotine dependence: Secondary | ICD-10-CM | POA: Insufficient documentation

## 2021-09-20 DIAGNOSIS — Z79899 Other long term (current) drug therapy: Secondary | ICD-10-CM | POA: Diagnosis not present

## 2021-09-20 DIAGNOSIS — I10 Essential (primary) hypertension: Secondary | ICD-10-CM

## 2021-09-20 DIAGNOSIS — R600 Localized edema: Secondary | ICD-10-CM

## 2021-09-20 DIAGNOSIS — R0602 Shortness of breath: Secondary | ICD-10-CM | POA: Diagnosis not present

## 2021-09-20 LAB — ECHOCARDIOGRAM COMPLETE
Area-P 1/2: 3.21 cm2
S' Lateral: 3.5 cm

## 2021-09-20 MED ORDER — PERFLUTREN LIPID MICROSPHERE
1.0000 mL | INTRAVENOUS | Status: AC | PRN
  Administered 2021-09-20: 1 mL via INTRAVENOUS

## 2021-09-20 MED ORDER — REGADENOSON 0.4 MG/5ML IV SOLN
0.4000 mg | Freq: Once | INTRAVENOUS | Status: AC
  Administered 2021-09-20: 0.4 mg via INTRAVENOUS

## 2021-09-20 MED ORDER — TECHNETIUM TC 99M TETROFOSMIN IV KIT
31.9000 | PACK | Freq: Once | INTRAVENOUS | Status: AC | PRN
  Administered 2021-09-20: 31.9 via INTRAVENOUS

## 2021-09-21 ENCOUNTER — Ambulatory Visit (HOSPITAL_COMMUNITY): Payer: No Typology Code available for payment source | Attending: Internal Medicine

## 2021-09-21 LAB — MYOCARDIAL PERFUSION IMAGING
Base ST Depression (mm): 0 mm
LV dias vol: 150 mL (ref 62–150)
LV sys vol: 72 mL
Nuc Stress EF: 52 %
Peak HR: 102 {beats}/min
Rest HR: 68 {beats}/min
Rest Nuclear Isotope Dose: 29.8 mCi
SDS: 2
SRS: 0
SSS: 2
ST Depression (mm): 0 mm
Stress Nuclear Isotope Dose: 31.9 mCi
TID: 1.24

## 2021-09-21 MED ORDER — TECHNETIUM TC 99M TETROFOSMIN IV KIT
29.8000 | PACK | Freq: Once | INTRAVENOUS | Status: AC | PRN
  Administered 2021-09-21: 29.8 via INTRAVENOUS

## 2021-09-27 ENCOUNTER — Ambulatory Visit
Admission: EM | Admit: 2021-09-27 | Discharge: 2021-09-27 | Disposition: A | Discharge status: Home / Self Care | Payer: No Typology Code available for payment source | Attending: Family Medicine | Admitting: Family Medicine

## 2021-09-27 DIAGNOSIS — J029 Acute pharyngitis, unspecified: Secondary | ICD-10-CM

## 2021-09-27 DIAGNOSIS — H5213 Myopia, bilateral: Secondary | ICD-10-CM | POA: Diagnosis not present

## 2021-09-27 DIAGNOSIS — H524 Presbyopia: Secondary | ICD-10-CM | POA: Diagnosis not present

## 2021-09-27 DIAGNOSIS — H52209 Unspecified astigmatism, unspecified eye: Secondary | ICD-10-CM | POA: Diagnosis not present

## 2021-09-27 MED ORDER — AMOXICILLIN 875 MG PO TABS: 875.0000 mg | ORAL_TABLET | Freq: Two times a day (BID) | ORAL | 0 refills | Status: DC

## 2021-09-27 MED ORDER — LIDOCAINE VISCOUS HCL 2 % MT SOLN: 10.0000 mL | OROMUCOSAL | 0 refills | Status: DC | PRN

## 2021-09-27 NOTE — ED Triage Notes (Signed)
Pt presents with sore throat X 2 weeks.

## 2021-09-27 NOTE — ED Provider Notes (Signed)
[facility]    CSN: [id number] Arrival date & time: 09/27/21  1510      History   Chief Complaint Chief Complaint  Patient presents with   Sore Throat    HPI Jesus [name] is a 66 y.o. male.   Patient presenting today with 2-week history of sore, swollen feeling throat and productive cough of thick mucus.  States he has tried salt water gargles, over-the-counter cold and congestion medications without relief.  States he feels infection in the back of his throat.  He has a history of throat cancer status post radiation, states he recently followed up with his oncologist and got a good report and the symptoms started after.  Denies fever, chills, dysphagia, chest pain, shortness of breath.  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- last radiation 06-2018   Cataract    removed both eyes with l;ens implants    Family history of colon cancer    in his brother- dx'd age 38    History of chemotherapy    last 06-01-2018   History of kidney stones    History of radiation therapy    last radiation 06-2018   Hyperlipidemia    currently under control   Hypertension    Irregular heart beats    Lymphadenopathy    Pain, lower leg    Bilateral   Peripheral arterial disease (HCC)    Pre-diabetes    Red-green color blindness    RLS (restless legs syndrome)    Snores    Wears glasses    Patient Active Problem List   Diagnosis Date Noted   Chronic pain 07/15/2021   Diabetes mellitus without complication ([place]) 79/89/2119   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   Gastroesophageal reflux disease without esophagitis 07/15/2021   History of lymphoma 07/15/2021   Lipoma of skin and subcutaneous tissue of trunk 07/15/2021   Mixed hyperlipidemia 07/15/2021    Prediabetes 07/15/2021   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   Tobacco dependence 07/15/2021   Trigger thumb of left hand 02/17/2021   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   Hoarseness 08/11/2020   Chronic pain of right knee 10/17/2018   Large cell (diffuse) non-Hodgkin's lymphoma ([place]) 05/10/2018   Bacteremia due to Escherichia coli    HTN (hypertension) 04/17/2018   RLS (restless legs syndrome) 04/17/2018   Abnormal LFTs 41/74/0814   Acute metabolic encephalopathy 48/18/5631   AKI (acute kidney injury) ([place]) 04/16/2018   Dehydration    Fever, unspecified    Sepsis ([place])    Disorientation    Non-Hodgkin's lymphoma of skin ([place]) 04/03/2018   Hypokalemia    Hypomagnesemia    Anemia    Encounter for antineoplastic chemotherapy    At high risk of tumor lysis syndrome    Swelling of lower leg    Diffuse large B cell lymphoma ([place]) 01/15/2018   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[place]) 01/12/2018   Counseling regarding advance care planning and goals of care 01/12/2018   Bilateral leg pain 05/27/2014   Past Surgical History:  Procedure Laterality Date   CATARACT EXTRACTION W/ INTRAOCULAR LENS  IMPLANT, BILATERAL  COLONOSCOPY     dislodged salava stone     FRACTURE SURGERY     HAND ARTHROPLASTY  1995   crushed left hand   INGUINAL LYMPH NODE BIOPSY Left 01/02/2018   Procedure: LEFT INGUINAL LYMPH NODE BIOPSY;  Surgeon: Rolm Bookbinder, MD;  Location: [place] [facility];  Service: General;  Laterality: Left;   IR IMAGING GUIDED PORT INSERTION  01/15/2018   IR REMOVAL TUN ACCESS W/ PORT W/O FL MOD SED  03/11/2019   MICROLARYNGOSCOPY Left 01/17/2014   Procedure: MICROLARYNGOSCOPY WITH EXCISION OF THE BIOPSY OF LEFT VOCAL CORD LESION;  Surgeon: Izora Gala, MD;  Location: [facility];  Service: ENT;  Laterality: Left;   MICROLARYNGOSCOPY N/A 09/16/2020   Procedure: MICROLARYNGOSCOPY  with Biopsy of vocal cord lesion;  Surgeon: Jerrell Belfast, MD;  Location: [place];  Service: ENT;  Laterality: N/A;   ORIF FOOT FRACTURE  2005   left   REFRACTIVE SURGERY Right    removed cloudiness in right eye after cataract removal      Home Medications    Prior to Admission medications   Medication Sig Start Date End Date Taking? Authorizing Provider  amoxicillin (AMOXIL) 875 MG tablet Take 1 tablet (875 mg total) by mouth 2 (two) times daily. 09/27/21  Yes Volney American, PA-C  lidocaine (XYLOCAINE) 2 % solution Use as directed 10 mLs in the mouth or throat every 3 (three) hours as needed for mouth pain. 09/27/21  Yes Volney American, PA-C  amLODipine (NORVASC) 10 MG tablet TAKE 1 TABLET BY MOUTH EVERY DAY Patient taking differently: Take 10 mg by mouth every evening. 02/19/20   Wendie Agreste, MD  aspirin EC 81 MG tablet Take 81 mg by mouth every evening. Swallow whole.    [provider]  lisinopril (ZESTRIL) 20 MG tablet TAKE 1 TABLET BY MOUTH EVERY DAY Patient taking differently: Take 20 mg by mouth every evening. 02/19/20   Wendie Agreste, MD  Multiple Vitamin (MULTIVITAMIN WITH MINERALS) TABS tablet Take 1 tablet by mouth every evening. Patient not taking: Reported on 08/27/2021    [provider]  Multiple Vitamins-Minerals (MULTI ADULT GUMMIES PO) Take 1 tablet by mouth in the morning. Centrum    [provider]  Nutritional Supplements (FRUIT & VEGETABLE DAILY PO) Take 4 tablets by mouth daily in the afternoon.    [provider]  tadalafil (CIALIS) 20 MG tablet Take 20 mg by mouth daily as needed for erectile dysfunction. 07/03/20   [provider]  vitamin B-12 (CYANOCOBALAMIN) 1000 MCG tablet Take 1,000 mcg by mouth every evening. Patient not taking: Reported on 08/27/2021    [provider]   Family History Family History  Problem Relation Age of Onset   Breast cancer Mother    Diabetes Father     Hypertension Father    Stroke Father    Mental illness Sister    Hypertension Daughter    Mental illness Daughter    Hypertension Brother    Colon cancer Brother 65       passed away 12/08/18   Breast cancer Sister    Esophageal cancer Neg Hx    Colon polyps Neg Hx    Rectal cancer Neg Hx    Stomach cancer Neg Hx    Social History Social History   Tobacco Use   Smoking status: Former    Packs/day: 0.50    Years: 36.00    Pack years: 18.00    Types: Cigarettes  Quit date: 09/28/2020    Years since quitting: 0.9   Smokeless tobacco: Never   Tobacco comments:    he denies smoking in about 2 weeks 07/13/18  Vaping Use   Vaping Use: Never used  Substance Use Topics   Alcohol use: Yes    Alcohol/week: 6.0 standard drinks    Types: 6 Cans of beer per week    Comment: weekends   Drug use: Yes    Types: Marijuana    Comment: reports cocaine usage ~2X/ month; last use 12/26/17     Allergies   Bee venom, Antifungal [miconazole nitrate], and Zolpidem tartrate er   Review of Systems Review of Systems Per HPI  Physical Exam Triage Vital Signs ED Triage Vitals [09/27/21 1549]  Enc Vitals Group     BP 138/83     Pulse Rate 95     Resp 18     Temp 98.1 F (36.7 C)     Temp Source Oral     SpO2 93 %     Weight      Height      Head Circumference      Peak Flow      Pain Score 6     Pain Loc      Pain Edu?      Excl. in [place]?    No data found.  Updated Vital Signs BP 138/83 (BP Location: Left Arm)   Pulse 95   Temp 98.1 F (36.7 C) (Oral)   Resp 18   SpO2 93%   Visual Acuity Right Eye Distance:   Left Eye Distance:   Bilateral Distance:    Right Eye Near:   Left Eye Near:    Bilateral Near:     Physical Exam Vitals and nursing note reviewed.  Constitutional:      Appearance: Normal appearance.  HENT:     Head: Atraumatic.     Nose: Nose normal.     Mouth/Throat:     Mouth: Mucous membranes are moist.     Pharynx: Oropharyngeal exudate and  posterior oropharyngeal erythema present.  Eyes:     Extraocular Movements: Extraocular movements intact.     Conjunctiva/sclera: Conjunctivae normal.  Cardiovascular:     Rate and Rhythm: Normal rate and regular rhythm.  Pulmonary:     Effort: Pulmonary effort is normal.     Breath sounds: Normal breath sounds. No wheezing or rales.  Musculoskeletal:        General: Normal range of motion.     Cervical back: Normal range of motion and neck supple.  Lymphadenopathy:     Cervical: No cervical adenopathy.  Skin:    General: Skin is warm and dry.  Neurological:     General: No focal deficit present.     Mental Status: He is oriented to person, place, and time.  Psychiatric:        Mood and Affect: Mood normal.        Thought Content: Thought content normal.        Judgment: Judgment normal.     UC Treatments / Results  Labs (all labs ordered are listed, but only abnormal results are displayed) Labs Reviewed - No data to display  EKG   Radiology No results found.  Procedures Procedures (including critical care time)  Medications Ordered in UC Medications - No data to display  Initial Impression / Assessment and Plan / UC Course  I have reviewed the triage vital signs and the nursing  notes.  Pertinent labs & imaging results that were available during my care of the patient were reviewed by me and considered in my medical decision making (see chart for details).     Given duration, worsening course will cover with amoxicillin, viscous lidocaine for comfort and continues water gargles and over-the-counter pain relievers.  We will forego strep testing as we are already covering for bacterial infection.  Has had radiation to this area however states that he typically does not have lasting symptoms secondary to that.  Follow-up for any worsening symptoms.  Final Clinical Impressions(s) / UC Diagnoses   Final diagnoses:  Sore throat   Discharge Instructions   None     ED Prescriptions     Medication Sig Dispense Auth. Provider   amoxicillin (AMOXIL) 875 MG tablet Take 1 tablet (875 mg total) by mouth 2 (two) times daily. 20 tablet Volney American, PA-C   lidocaine (XYLOCAINE) 2 % solution Use as directed 10 mLs in the mouth or throat every 3 (three) hours as needed for mouth pain. 100 mL Volney American, [place]      PDMP not reviewed this encounter.   Volney American, [place] 09/27/21 1743

## 2021-10-06 DIAGNOSIS — Z03818 Encounter for observation for suspected exposure to other biological agents ruled out: Secondary | ICD-10-CM | POA: Diagnosis not present

## 2021-10-06 DIAGNOSIS — R0981 Nasal congestion: Secondary | ICD-10-CM | POA: Diagnosis not present

## 2021-10-06 DIAGNOSIS — R059 Cough, unspecified: Secondary | ICD-10-CM | POA: Diagnosis not present

## 2021-10-13 ENCOUNTER — Other Ambulatory Visit: Payer: Self-pay | Admitting: Oncology

## 2021-11-10 ENCOUNTER — Telehealth: Payer: No Typology Code available for payment source | Admitting: Physician Assistant

## 2021-11-10 DIAGNOSIS — R1032 Left lower quadrant pain: Secondary | ICD-10-CM | POA: Diagnosis not present

## 2021-11-10 NOTE — Progress Notes (Signed)
Virtual Visit Consent   Jesus [name], you are scheduled for a virtual visit with a [facility] provider today. Just as with appointments in the office, your consent must be obtained to participate. Your consent will be active for this visit and any virtual visit you may have with one of our providers in the next 365 days. If you have a MyChart account, a copy of this consent can be sent to you electronically.  As this is a virtual visit, video technology does not allow for your provider to perform a traditional examination. This may limit your provider's ability to fully assess your condition. If your provider identifies any concerns that need to be evaluated in person or the need to arrange testing (such as labs, EKG, etc.), we will make arrangements to do so. Although advances in technology are sophisticated, we cannot ensure that it will always work on either your end or our end. If the connection with a video visit is poor, the visit may have to be switched to a telephone visit. With either a video or telephone visit, we are not always able to ensure that we have a secure connection.  By engaging in this virtual visit, you consent to the provision of healthcare and authorize for your insurance to be billed (if applicable) for the services provided during this visit. Depending on your insurance coverage, you may receive a charge related to this service.  I need to obtain your verbal consent now. Are you willing to proceed with your visit today? Jesus [name] has provided verbal consent on 11/10/2021 for a virtual visit (video or telephone). Mar Daring, PA-C  Date: 11/10/2021 12:30 PM  Virtual Visit via Video Note   I, Mar Daring, connected with  Jesus [name]  ([id number], 04/28/1956) on 11/10/21 at 11:45 AM EDT by a video-enabled telemedicine application and verified that I am speaking with the correct person using two identifiers.  Location: Patient: Virtual Visit Location  Patient: Home Provider: Virtual Visit Location Provider: Home Office   I discussed the limitations of evaluation and management by telemedicine and the availability of in person appointments. The patient expressed understanding and agreed to proceed.    History of Present Illness: Jesus [name] is a 66 y.o. who identifies as a male who was assigned male at birth, and is being seen today for groin pain.  HPI: Groin Pain Primary symptoms comment: left groin pain, palpated area and had a sharp pain. This is a new problem. The current episode started yesterday. The problem occurs constantly. The problem has been gradually worsening. The pain is moderate. Associated symptoms include frequency. Pertinent negatives include no abdominal pain, anorexia, chills, constipation, diarrhea, discolored urine, dysuria, fever, flank pain, hematuria, hesitancy, nausea, rash, urgency or urinary retention. Associated symptoms comments: Reports increased indigestion. The symptoms are aggravated by tactile pressure. He has tried nothing for the symptoms. The treatment provided no relief.    Does have medical history on Non-Hodgkin's Lymphoma and reports being told he has a hernia as well.   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   Hoarseness 08/11/2020   Chronic pain of right knee 10/17/2018   Large cell (diffuse)  non-Hodgkin's lymphoma ([place]) 05/10/2018   Bacteremia due to Escherichia coli    HTN (hypertension) 04/17/2018   RLS (restless legs syndrome) 04/17/2018   Abnormal LFTs 12/45/8099   Acute metabolic encephalopathy 83/38/2505   AKI (acute kidney injury) ([place]) 04/16/2018   Dehydration    Fever, unspecified    Sepsis ([place])    Disorientation    Non-Hodgkin's lymphoma of skin ([place]) 04/03/2018   Hypokalemia    Hypomagnesemia    Anemia    Encounter for antineoplastic chemotherapy    At high risk of tumor lysis syndrome    Swelling of lower leg    Diffuse large B cell lymphoma ([place]) 01/15/2018   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[place]) 01/12/2018   Counseling regarding advance care planning and goals of care 01/12/2018   Bilateral leg pain 05/27/2014    Allergies:  Allergies  Allergen Reactions   Bee Venom Anaphylaxis   Antifungal [Miconazole Nitrate]     Other reaction(s): made his head hurt   Zolpidem Tartrate Er Other (See Comments)   Medications:  Current Outpatient Medications:    amLODipine (NORVASC) 10 MG tablet, TAKE 1 TABLET BY MOUTH EVERY DAY (Patient taking differently: Take 10 mg by mouth every evening.), Disp: 30 tablet, Rfl: 0   amoxicillin (AMOXIL) 875 MG tablet, Take 1 tablet (875 mg total) by mouth 2 (two) times daily., Disp: 20 tablet, Rfl: 0   aspirin EC 81 MG tablet, Take 81 mg by mouth every evening. Swallow whole., Disp: , Rfl:    lidocaine (XYLOCAINE) 2 % solution, Use as directed 10 mLs in the mouth or throat every 3 (three) hours as needed for mouth pain., Disp: 100 mL, Rfl: 0   lisinopril (ZESTRIL) 20 MG tablet, TAKE 1 TABLET BY MOUTH EVERY DAY (Patient taking differently: Take 20 mg by mouth every evening.), Disp: 30 tablet, Rfl: 0   Multiple Vitamins-Minerals (MULTI ADULT GUMMIES PO), Take 1 tablet by mouth in the morning. Centrum, Disp: , Rfl:    Nutritional Supplements (FRUIT & VEGETABLE DAILY PO), Take 4 tablets by mouth daily  in the afternoon., Disp: , Rfl:    tadalafil (CIALIS) 20 MG tablet, Take 20 mg by mouth daily as needed for erectile dysfunction., Disp: , Rfl:    vitamin B-12 (CYANOCOBALAMIN) 1000 MCG tablet, Take 1,000 mcg by mouth every evening. (Patient not taking: Reported on 08/27/2021), Disp: , Rfl:   Observations/Objective: Patient is well-developed, well-nourished in no acute distress.  Resting comfortably at home.  Head is normocephalic, atraumatic.  No labored breathing.  Speech is clear and coherent with logical content.  Patient is alert and oriented at baseline.    Assessment and Plan: 1. Left groin pain  - Mild currently - Advised to try conservative management with ice for next 24 hours then change to heat and tylenol as needed for pain - May follow up with Oncologist at scheduled visit on 11/18/21 if not worsening - If symptoms worsen at all needs to be seen at a local Urgent Care or ER  Follow Up Instructions: I discussed the assessment and treatment plan with the patient. The patient was provided an opportunity to ask questions and all were answered. The patient agreed  with the plan and demonstrated an understanding of the instructions.  A copy of instructions were sent to the patient via MyChart unless otherwise noted below.    The patient was advised to call back or seek an in-person evaluation if the symptoms worsen or if the condition fails to improve as anticipated.  Time:  I spent 15 minutes with the patient via telehealth technology discussing the above problems/concerns.    Mar Daring, PA-C

## 2021-11-10 NOTE — Patient Instructions (Signed)
  Jesus [name], thank you for joining Mar Daring, PA-C for today's virtual visit.  While this provider is not your primary care provider (PCP), if your PCP is located in our provider database this encounter information will be shared with them immediately following your visit.  Consent: (Patient) Jesus [name] provided verbal consent for this virtual visit at the beginning of the encounter.  Current Medications:  Current Outpatient Medications:    amLODipine (NORVASC) 10 MG tablet, TAKE 1 TABLET BY MOUTH EVERY DAY (Patient taking differently: Take 10 mg by mouth every evening.), Disp: 30 tablet, Rfl: 0   amoxicillin (AMOXIL) 875 MG tablet, Take 1 tablet (875 mg total) by mouth 2 (two) times daily., Disp: 20 tablet, Rfl: 0   aspirin EC 81 MG tablet, Take 81 mg by mouth every evening. Swallow whole., Disp: , Rfl:    lidocaine (XYLOCAINE) 2 % solution, Use as directed 10 mLs in the mouth or throat every 3 (three) hours as needed for mouth pain., Disp: 100 mL, Rfl: 0   lisinopril (ZESTRIL) 20 MG tablet, TAKE 1 TABLET BY MOUTH EVERY DAY (Patient taking differently: Take 20 mg by mouth every evening.), Disp: 30 tablet, Rfl: 0   Multiple Vitamins-Minerals (MULTI ADULT GUMMIES PO), Take 1 tablet by mouth in the morning. Centrum, Disp: , Rfl:    Nutritional Supplements (FRUIT & VEGETABLE DAILY PO), Take 4 tablets by mouth daily in the afternoon., Disp: , Rfl:    tadalafil (CIALIS) 20 MG tablet, Take 20 mg by mouth daily as needed for erectile dysfunction., Disp: , Rfl:    vitamin B-12 (CYANOCOBALAMIN) 1000 MCG tablet, Take 1,000 mcg by mouth every evening. (Patient not taking: Reported on 08/27/2021), Disp: , Rfl:    Medications ordered in this encounter:  No orders of the defined types were placed in this encounter.    *If you need refills on other medications prior to your next appointment, please contact your pharmacy*  Follow-Up: Call back or seek an in-person evaluation if the  symptoms worsen or if the condition fails to improve as anticipated.  Other Instructions - Advised to try conservative management with ice for next 24 hours then change to heat and tylenol as needed for pain - May follow up with Oncologist at scheduled visit on 11/18/21 if not worsening - If symptoms worsen at all needs to be seen at a local Urgent Care or ER   If you have been instructed to have an in-person evaluation today at a local Urgent Care facility, please use the link below. It will take you to a list of all of our available [facility], including address, phone number and hours of operation. Please do not delay care.  [facility]  If you or a family member do not have a primary care provider, use the link below to schedule a visit and establish care. When you choose a [facility] primary care physician or advanced practice provider, you gain a long-term partner in health. Find a Primary Care Provider  Learn more about [facility]'s in-office and virtual care options: [facility]

## 2021-11-11 ENCOUNTER — Telehealth: Payer: Self-pay | Admitting: Hematology

## 2021-11-11 NOTE — Telephone Encounter (Signed)
Rescheduled upcoming appointment due to provider's PAL. Patient is aware of changes. ?

## 2021-11-18 ENCOUNTER — Ambulatory Visit: Payer: No Typology Code available for payment source | Admitting: Hematology

## 2021-11-18 ENCOUNTER — Other Ambulatory Visit: Payer: No Typology Code available for payment source

## 2021-11-29 NOTE — Progress Notes (Signed)
Mr. Mader presents for follow-up after completing radiation to his larynx on 11/25/2020  Pain issues, if any: Denies any mouth or throat pain, or ear or jaw pain Using a feeding tube?: N/A Weight changes, if any:  Wt Readings from Last 3 Encounters:  11/30/21 (!) 329 lb 6.4 oz (149.4 kg)  09/20/21 (!) 324 lb (147 kg)  08/27/21 (!) 324 lb (147 kg)   Swallowing issues, if any: Only with very finely chopped meat or crunchy foods with a lot of crumbs. Otherwise reports he can eat/drink whatever he's interested in Smoking or chewing tobacco? Not recently--reports he smoked for ~1 week during a difficult time in life, but quit and has remained off cigarettes.  Using fluoride trays daily? N/A--sees his periodontist regularly and is scheduled for dental work at [place] [facility] visit was on: 08/03/2021 Dr. 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 He underwent biopsy on 09/16/2020 of a right vocal cord lesion. The patient underwent treatment with radiation therapy through the [facility] and completed treatment with Dr. Isidore Moos on 11/25/2020. He presents today for scheduled follow-up appointment, stable with out any active symptoms or concerns, normal respiration and swallowing. Flexible laryngoscopy performed today shows normal vocal cord mobility without ulcer, mass or lesion. Patient reassured regarding today's findings. Monitor for additional symptoms and plan follow-up in 6 months. We will alternate posttreatment observation with Dr. Isidore Moos  Other notable issues, if any: Continues to deal with dry mouth and thick saliva. Reports vocal hoarseness with fatigue. Had several stressors in his life over the summer, but reports he's managing.

## 2021-11-30 ENCOUNTER — Ambulatory Visit
Admission: RE | Admit: 2021-11-30 | Discharge: 2021-11-30 | Disposition: A | Discharge status: Home / Self Care | Payer: No Typology Code available for payment source | Source: Ambulatory Visit | Attending: Radiation Oncology | Admitting: Radiation Oncology

## 2021-11-30 ENCOUNTER — Other Ambulatory Visit: Payer: Self-pay

## 2021-11-30 VITALS — BP 138/81 | HR 102 | Temp 97.9°F | Resp 20 | Ht 72.0 in | Wt 329.4 lb

## 2021-11-30 DIAGNOSIS — C320 Malignant neoplasm of glottis: Secondary | ICD-10-CM

## 2021-11-30 DIAGNOSIS — Z79899 Other long term (current) drug therapy: Secondary | ICD-10-CM | POA: Insufficient documentation

## 2021-11-30 DIAGNOSIS — C8330 Diffuse large B-cell lymphoma, unspecified site: Secondary | ICD-10-CM

## 2021-11-30 DIAGNOSIS — Z7982 Long term (current) use of aspirin: Secondary | ICD-10-CM | POA: Diagnosis not present

## 2021-11-30 DIAGNOSIS — Z87891 Personal history of nicotine dependence: Secondary | ICD-10-CM | POA: Insufficient documentation

## 2021-11-30 LAB — CBC WITH DIFFERENTIAL (CANCER CENTER ONLY)
Abs Immature Granulocytes: 0.02 10*3/uL (ref 0.00–0.07)
Basophils Absolute: 0.1 10*3/uL (ref 0.0–0.1)
Basophils Relative: 1 %
Eosinophils Absolute: 0.2 10*3/uL (ref 0.0–0.5)
Eosinophils Relative: 3 %
HCT: 34.7 % — ABNORMAL LOW (ref 39.0–52.0)
Hemoglobin: 12.2 g/dL — ABNORMAL LOW (ref 13.0–17.0)
Immature Granulocytes: 0 %
Lymphocytes Relative: 41 %
Lymphs Abs: 2.6 10*3/uL (ref 0.7–4.0)
MCH: 30.7 pg (ref 26.0–34.0)
MCHC: 35.2 g/dL (ref 30.0–36.0)
MCV: 87.4 fL (ref 80.0–100.0)
Monocytes Absolute: 0.5 10*3/uL (ref 0.1–1.0)
Monocytes Relative: 8 %
Neutro Abs: 3.1 10*3/uL (ref 1.7–7.7)
Neutrophils Relative %: 47 %
Platelet Count: 287 10*3/uL (ref 150–400)
RBC: 3.97 MIL/uL — ABNORMAL LOW (ref 4.22–5.81)
RDW: 13.8 % (ref 11.5–15.5)
WBC Count: 6.4 10*3/uL (ref 4.0–10.5)
nRBC: 0 % (ref 0.0–0.2)

## 2021-11-30 LAB — CMP (CANCER CENTER ONLY)
ALT: 11 U/L (ref 0–44)
AST: 15 U/L (ref 15–41)
Albumin: 4.3 g/dL (ref 3.5–5.0)
Alkaline Phosphatase: 60 U/L (ref 38–126)
Anion gap: 8 (ref 5–15)
BUN: 16 mg/dL (ref 8–23)
CO2: 26 mmol/L (ref 22–32)
Calcium: 9.1 mg/dL (ref 8.9–10.3)
Chloride: 104 mmol/L (ref 98–111)
Creatinine: 1.17 mg/dL (ref 0.61–1.24)
GFR, Estimated: >60 mL/min (ref >60)
Glucose, Bld: 236 mg/dL — ABNORMAL HIGH (ref 70–99)
Potassium: 3.8 mmol/L (ref 3.5–5.1)
Sodium: 138 mmol/L (ref 135–145)
Total Bilirubin: 0.4 mg/dL (ref 0.3–1.2)
Total Protein: 7 g/dL (ref 6.5–8.1)

## 2021-11-30 LAB — LACTATE DEHYDROGENASE: LDH: 165 U/L (ref 98–192)

## 2021-11-30 MED ORDER — OXYMETAZOLINE HCL 0.05 % NA SOLN
2.0000 | Freq: Once | NASAL | Status: AC
  Administered 2021-11-30: 2 via NASAL
  Filled 2021-11-30: qty 30

## 2021-11-30 NOTE — Progress Notes (Signed)
 Radiation Oncology         [phone] ________________________________  Name: Jesus [name] MRN: [id number]  Date: 11/30/2021  DOB: November 10, 1955  Follow-Up Visit Note  CC: Jesus Han, MD  Jesus Han, MD  Diagnosis and Prior Radiotherapy:       ICD-10-CM   1. Malignant neoplasm of glottis (HCC)  C32.0 Fiberoptic laryngoscopy    oxymetazoline  (AFRIN) 0.05 % nasal spray 2 spray    TSH      Cancer Staging  Diffuse large B-cell lymphoma of lymph nodes of multiple regions [facility]) Staging form: Hodgkin and Non-Hodgkin Lymphoma, AJCC 8th Edition - Clinical: Stage IV - Signed by Jesus Dawes, MD on 07/13/2018  Malignant neoplasm of glottis [facility]) Staging form: Larynx - Glottis, AJCC 8th Edition - Clinical stage from 10/02/2020: Stage I (cT1a, cN0, cM0) - Signed by Jesus Dawes, MD on 10/02/2020 Stage prefix: Initial diagnosis   Radiation Treatment Dates: 10/15/2020 through 11/25/2020 Site Technique Total Dose (Gy) Dose per Fx (Gy) Completed Fx Beam Energies  Larynx: HN_larynx 3D 63/63 2.25 28/28 6X   CHIEF COMPLAINT:  Here for follow-up and surveillance of throat cancer  Narrative:   [name] presents for follow-up after completing radiation to his larynx on 11/25/2020  Pain issues, if any: Denies any mouth or throat pain, or ear or jaw pain Using a feeding tube?: N/A Weight changes, if any:  Wt Readings from Last 3 Encounters:  11/30/21 (!) 329 lb 6.4 oz (149.4 kg)  09/20/21 (!) 324 lb (147 kg)  08/27/21 (!) 324 lb (147 kg)   Swallowing issues, if any: Only with very finely chopped meat or crunchy foods with a lot of crumbs. Otherwise reports he can eat/drink whatever he's interested in Smoking or chewing tobacco? Not recently--reports he smoked for ~1 week during a difficult time in life, but quit and has remained off cigarettes.  Using fluoride trays daily? N/A--sees his periodontist regularly and is scheduled for dental work at [name]  Last ENT visit was  on: 08/03/2021 Dr. 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 He underwent biopsy on 09/16/2020 of a right vocal cord lesion. The patient underwent treatment with radiation therapy through the [facility] and completed treatment with Dr. Lurena [name] on 11/25/2020. He presents today for scheduled follow-up appointment, stable with out any active symptoms or concerns, normal respiration and swallowing. Flexible laryngoscopy performed today shows normal vocal cord mobility without ulcer, mass or lesion. Patient reassured regarding today's findings. Monitor for additional symptoms and plan follow-up in 6 months. We will alternate posttreatment observation with Dr. Lurena [name]  Other notable issues, if any: Continues to deal with dry mouth and thick saliva. Reports vocal hoarseness with fatigue. Had several stressors in his life over the summer, but reports he's managing.  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                     ALLERGIES:  is allergic to bee venom, antifungal [miconazole nitrate], and zolpidem tartrate er.  Meds: Current Outpatient Medications  Medication Sig Dispense Refill   amLODipine  (NORVASC ) 10 MG tablet TAKE 1 TABLET BY MOUTH EVERY DAY (Patient taking differently: Take 10 mg by mouth every evening.) 30 tablet 0   amoxicillin  (AMOXIL ) 875 MG tablet Take 1 tablet (875 mg total) by mouth 2 (two) times daily. 20 tablet 0   aspirin  EC 81 MG tablet Take 81  mg by mouth every evening. Swallow whole.     lisinopril  (ZESTRIL ) 20 MG tablet TAKE 1 TABLET BY MOUTH EVERY DAY (Patient taking differently: Take 20 mg by mouth every evening.) 30 tablet 0   Multiple Vitamins-Minerals (MULTI ADULT GUMMIES PO) Take 1 tablet by mouth in the morning. Centrum     Nutritional Supplements (FRUIT & VEGETABLE DAILY PO) Take 4 tablets by mouth daily in the afternoon.     tadalafil  (CIALIS ) 20 MG tablet Take 20 mg  by mouth daily as needed for erectile dysfunction.     vitamin B-12 (CYANOCOBALAMIN ) 1000 MCG tablet Take 1,000 mcg by mouth every evening. (Patient not taking: Reported on 08/27/2021)     No current facility-administered medications for this encounter.    Physical Findings: The patient is in no acute distress. Patient is alert and oriented. Wt Readings from Last 3 Encounters:  11/30/21 (!) 329 lb 6.4 oz (149.4 kg)  09/20/21 (!) 324 lb (147 kg)  08/27/21 (!) 324 lb (147 kg)    height is 6' (1.829 m) and weight is 329 lb 6.4 oz (149.4 kg) (abnormal). His temperature is 97.9 F (36.6 C). His blood pressure is 138/81 and his pulse is 102 (abnormal). His respiration is 20 and oxygen saturation is 99%. .  General: Alert and oriented, in no acute distress HEENT: Head is normocephalic. Extraocular movements are intact. Oropharynx / mouth notable for no lesions  Neck: no masses Skin: Skin in treatment fields shows satisfactory healing  Heart RRR Chest CTAB Psychiatric: Judgment and insight are intact. Affect is appropriate.  PROCEDURE NOTE: After obtaining consent and spraying nasal cavity with topical oxymetazoline , the flexible endoscope was coated w/ lidocaine  gel and introduced and passed through the nasal cavity.  The nasopharynx, oropharynx, hypopharynx, and larynx  were then examined.  There is a small raised white mass above the right true cord which appears to be debris but does not clear with coughing or swallowing.  See photo below.  Otherwise no lesions appreciated in the mucosal axis.  The true cords were symmetrically mobile. He tolerated the procedure well.    Lab Findings: Lab Results  Component Value Date   WBC 6.4 11/30/2021   HGB 12.2 (L) 11/30/2021   HCT 34.7 (L) 11/30/2021   MCV 87.4 11/30/2021   PLT 287 11/30/2021    Lab Results  Component Value Date   TSH 1.528 11/30/2021    Radiographic Findings: No results found.  Impression/Plan:    1) Head and Neck  Cancer Status: I favor that he has no evidence of disease but I did share the photograph above with Jesus [name].  As a precaution Jesus [name] recommends that the patient be seen in his clinic within the next 4 weeks to verify that that lesion is no longer present.  Our navigator Bridgette Campus will arrange  2) Nutritional Status: No active issues, eating well Wt Readings from Last 3 Encounters:  11/30/21 (!) 329 lb 6.4 oz (149.4 kg)  09/20/21 (!) 324 lb (147 kg)  08/27/21 (!) 324 lb (147 kg)    PEG tube: None  3) Risk Factors: The patient has been educated about risk factors including alcohol and smoking /tobacco abuse; they understand that avoidance of alcohol and smoking/tobacco is important to prevent recurrences as well as other cancers.  He had an episode where he smoked briefly due to stress but is currently not smoking.  I encouraged him to continue cessation  4) Swallowing: no issues  5)  Thyroid   function: Check annually with PCP or at cancer center -[facility] Lab Results  Component Value Date   TSH 1.528 11/30/2021    6)  Follow-up with me in  5mo.  Bridgette Campus RN will contact [name] Scheduling with request Mr. Haynie be seen as per my discussion with Jesus [name] above.    On date of service, in total, I spent 30 minutes on this encounter. Patient was seen in person.  This note was signed the day after encounter but the minutes above reflect my time spent on date of service _____________________________________   Jesus Dawes, MD

## 2021-12-01 ENCOUNTER — Encounter: Payer: Self-pay | Admitting: Radiation Oncology

## 2021-12-01 LAB — TSH: TSH: 1.528 u[IU]/mL (ref 0.350–4.500)

## 2021-12-03 NOTE — Progress Notes (Signed)
Oncology Nurse Navigator Documentation   Dr. Isidore Moos requested that I schedule Jesus [name] within the next 4 weeks with Dr. Wilburn Cornelia for a white lesion noted on his right vocal cord during her recent laryngoscopy. I have scheduled him with Dr. Wilburn Cornelia on 12/28/21 at 4 pm. I called and spoke to him today and informed him of the date/time of the appointment and he was agreeable. He knows to call me if he has any further questions or concerns.  Harlow Asa RN, BSN, OCN Head & Neck Oncology Nurse [place] at [facility] Phone # [phone]  Fax # [phone]

## 2021-12-27 DIAGNOSIS — I10 Essential (primary) hypertension: Secondary | ICD-10-CM | POA: Diagnosis not present

## 2021-12-27 DIAGNOSIS — Z125 Encounter for screening for malignant neoplasm of prostate: Secondary | ICD-10-CM | POA: Diagnosis not present

## 2021-12-27 DIAGNOSIS — Z8579 Personal history of other malignant neoplasms of lymphoid, hematopoietic and related tissues: Secondary | ICD-10-CM | POA: Diagnosis not present

## 2021-12-27 DIAGNOSIS — Z0000 Encounter for general adult medical examination without abnormal findings: Secondary | ICD-10-CM | POA: Diagnosis not present

## 2021-12-27 DIAGNOSIS — E782 Mixed hyperlipidemia: Secondary | ICD-10-CM | POA: Diagnosis not present

## 2021-12-27 DIAGNOSIS — N529 Male erectile dysfunction, unspecified: Secondary | ICD-10-CM | POA: Diagnosis not present

## 2021-12-27 DIAGNOSIS — E119 Type 2 diabetes mellitus without complications: Secondary | ICD-10-CM | POA: Diagnosis not present

## 2021-12-27 DIAGNOSIS — C320 Malignant neoplasm of glottis: Secondary | ICD-10-CM | POA: Diagnosis not present

## 2021-12-27 DIAGNOSIS — R0683 Snoring: Secondary | ICD-10-CM | POA: Diagnosis not present

## 2021-12-28 ENCOUNTER — Other Ambulatory Visit: Payer: Self-pay | Admitting: *Deleted

## 2021-12-28 DIAGNOSIS — Z9221 Personal history of antineoplastic chemotherapy: Secondary | ICD-10-CM | POA: Diagnosis not present

## 2021-12-28 DIAGNOSIS — J383 Other diseases of vocal cords: Secondary | ICD-10-CM | POA: Diagnosis not present

## 2021-12-28 DIAGNOSIS — Z923 Personal history of irradiation: Secondary | ICD-10-CM | POA: Diagnosis not present

## 2021-12-28 DIAGNOSIS — C8330 Diffuse large B-cell lymphoma, unspecified site: Secondary | ICD-10-CM

## 2021-12-28 DIAGNOSIS — Z8521 Personal history of malignant neoplasm of larynx: Secondary | ICD-10-CM | POA: Diagnosis not present

## 2021-12-29 ENCOUNTER — Inpatient Hospital Stay (HOSPITAL_BASED_OUTPATIENT_CLINIC_OR_DEPARTMENT_OTHER): Payer: No Typology Code available for payment source | Admitting: Hematology

## 2021-12-29 ENCOUNTER — Inpatient Hospital Stay: Payer: No Typology Code available for payment source | Attending: Hematology

## 2021-12-29 ENCOUNTER — Other Ambulatory Visit: Payer: Self-pay

## 2021-12-29 VITALS — BP 141/78 | HR 80 | Temp 98.7°F | Resp 19 | Ht 72.0 in | Wt 328.3 lb

## 2021-12-29 DIAGNOSIS — Z8521 Personal history of malignant neoplasm of larynx: Secondary | ICD-10-CM | POA: Diagnosis not present

## 2021-12-29 DIAGNOSIS — Z9221 Personal history of antineoplastic chemotherapy: Secondary | ICD-10-CM | POA: Insufficient documentation

## 2021-12-29 DIAGNOSIS — Z7982 Long term (current) use of aspirin: Secondary | ICD-10-CM | POA: Insufficient documentation

## 2021-12-29 DIAGNOSIS — Z8572 Personal history of non-Hodgkin lymphomas: Secondary | ICD-10-CM | POA: Diagnosis not present

## 2021-12-29 DIAGNOSIS — Z87891 Personal history of nicotine dependence: Secondary | ICD-10-CM | POA: Diagnosis not present

## 2021-12-29 DIAGNOSIS — Z923 Personal history of irradiation: Secondary | ICD-10-CM | POA: Diagnosis not present

## 2021-12-29 DIAGNOSIS — R1012 Left upper quadrant pain: Secondary | ICD-10-CM | POA: Diagnosis not present

## 2021-12-29 DIAGNOSIS — Z79899 Other long term (current) drug therapy: Secondary | ICD-10-CM | POA: Insufficient documentation

## 2021-12-29 DIAGNOSIS — C8330 Diffuse large B-cell lymphoma, unspecified site: Secondary | ICD-10-CM

## 2021-12-29 LAB — CMP (CANCER CENTER ONLY)
ALT: 15 U/L (ref 0–44)
AST: 17 U/L (ref 15–41)
Albumin: 4.4 g/dL (ref 3.5–5.0)
Alkaline Phosphatase: 57 U/L (ref 38–126)
Anion gap: 8 (ref 5–15)
BUN: 15 mg/dL (ref 8–23)
CO2: 26 mmol/L (ref 22–32)
Calcium: 9.4 mg/dL (ref 8.9–10.3)
Chloride: 104 mmol/L (ref 98–111)
Creatinine: 0.91 mg/dL (ref 0.61–1.24)
GFR, Estimated: >60 mL/min (ref >60)
Glucose, Bld: 154 mg/dL — ABNORMAL HIGH (ref 70–99)
Potassium: 4.2 mmol/L (ref 3.5–5.1)
Sodium: 138 mmol/L (ref 135–145)
Total Bilirubin: 0.7 mg/dL (ref 0.3–1.2)
Total Protein: 7.1 g/dL (ref 6.5–8.1)

## 2021-12-29 LAB — CBC WITH DIFFERENTIAL (CANCER CENTER ONLY)
Abs Immature Granulocytes: 0.01 10*3/uL (ref 0.00–0.07)
Basophils Absolute: 0.1 10*3/uL (ref 0.0–0.1)
Basophils Relative: 1 %
Eosinophils Absolute: 0.2 10*3/uL (ref 0.0–0.5)
Eosinophils Relative: 3 %
HCT: 34.3 % — ABNORMAL LOW (ref 39.0–52.0)
Hemoglobin: 11.6 g/dL — ABNORMAL LOW (ref 13.0–17.0)
Immature Granulocytes: 0 %
Lymphocytes Relative: 45 %
Lymphs Abs: 2.2 10*3/uL (ref 0.7–4.0)
MCH: 29.4 pg (ref 26.0–34.0)
MCHC: 33.8 g/dL (ref 30.0–36.0)
MCV: 87.1 fL (ref 80.0–100.0)
Monocytes Absolute: 0.5 10*3/uL (ref 0.1–1.0)
Monocytes Relative: 10 %
Neutro Abs: 2 10*3/uL (ref 1.7–7.7)
Neutrophils Relative %: 41 %
Platelet Count: 284 10*3/uL (ref 150–400)
RBC: 3.94 MIL/uL — ABNORMAL LOW (ref 4.22–5.81)
RDW: 13.6 % (ref 11.5–15.5)
WBC Count: 4.9 10*3/uL (ref 4.0–10.5)
nRBC: 0 % (ref 0.0–0.2)

## 2021-12-29 LAB — TSH: TSH: 1.356 u[IU]/mL (ref 0.350–4.500)

## 2021-12-29 LAB — LACTATE DEHYDROGENASE: LDH: 172 U/L (ref 98–192)

## 2022-01-04 NOTE — Progress Notes (Signed)
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 male is here because of left lower extremity edema and lymphadenopathy.  The patient was seen in the emergency room this past Friday for the same issue.  A CT of the abdomen and pelvis was performed showing bulky left inguinal, left hemipelvic, and retroperitoneal adenopathy.  He was referred to [place] from the emergency room for further evaluation.  Doppler ultrasound of the left lower extremity was performed and was negative for DVT. Patient reports that he has been having left lower extremity edema in his left groin and left leg for approximately 1 month.  He states that the swelling in the left groin started to get better but then worsened.  The left lower extremity edema has slowly worsened over time.  Patient denies having fevers and chills.  He reports that he does have night sweats at times.  He reported having [place] approximate 1 [place]  He thinks his headaches are related to not having his blood pressure medication.  His headaches have now resolved.  He denies visual changes.  The patient denies chest pain, shortness of breath and cough.  No nausea, vomiting, constipation, diarrhea.  Denies abdominal pain.  The patient denies recent weight loss  and has actually gained weight recently.  Patient denies epistaxis, bleeding gums, hemoptysis, hematuria, but occasionally, and melena.  He reports increased urinary frequency over the past month but no dysuria.  The patient is here for evaluation and discussion of his recent CT and lab findings.  Interval History:   Jesus [name] is here for continued evaluation and management of his history of T-cell rich B-cell lymphoma and vocal cord squamous Cell carcinoma. He notes no acute new symptoms since his last clinic visit.  No new cough or shortness of breath. No new enlarged lymph nodes. No chest pain or shortness of breath. No chest pain no shortness of breath no fevers no chills no night sweats.  Labs done today were discussed in detail with the patient .  MEDICAL HISTORY:   Past Medical History:  Diagnosis Date   Allergy    Anemia    during chemo   Arthritis    knee    Blood transfusion without reported diagnosis    Cancer ([place])    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- last radiation 06-2018   Cataract    removed both eyes with l;ens implants    Family history of colon cancer    in his brother- dx'd age 51    History of chemotherapy    last 06-01-2018   History of kidney stones    History of radiation therapy    last radiation 06-2018   Hyperlipidemia    currently under control   Hypertension    Irregular heart beats  Lymphadenopathy    Pain, lower leg    Bilateral   Peripheral arterial disease (HCC)    Pre-diabetes    Red-green color blindness    RLS (restless legs syndrome)    Snores    Wears glasses     SURGICAL HISTORY: Past Surgical History:  Procedure Laterality Date   CATARACT EXTRACTION W/ INTRAOCULAR LENS  IMPLANT, BILATERAL     COLONOSCOPY     dislodged salava stone     FRACTURE SURGERY     HAND ARTHROPLASTY  1995   crushed left hand   INGUINAL LYMPH NODE BIOPSY Left 01/02/2018   Procedure: LEFT INGUINAL LYMPH  NODE BIOPSY;  Surgeon: Rolm Bookbinder, MD;  Location: [place];  Service: General;  Laterality: Left;   IR IMAGING GUIDED PORT INSERTION  01/15/2018   IR REMOVAL TUN ACCESS W/ PORT W/O FL MOD SED  03/11/2019   MICROLARYNGOSCOPY Left 01/17/2014   Procedure: MICROLARYNGOSCOPY WITH EXCISION OF THE BIOPSY OF LEFT VOCAL CORD LESION;  Surgeon: Izora Gala, MD;  Location: [facility];  Service: ENT;  Laterality: Left;   MICROLARYNGOSCOPY N/A 09/16/2020   Procedure: MICROLARYNGOSCOPY with Biopsy of vocal cord lesion;  Surgeon: Jerrell Belfast, MD;  Location: [facility];  Service: ENT;  Laterality: N/A;   ORIF FOOT FRACTURE  2005   left   REFRACTIVE SURGERY Right    removed cloudiness in right eye after cataract removal     SOCIAL HISTORY: Social History   Socioeconomic History   Marital status: Divorced    Spouse name: Not on file   Number of children: 3   Years of education: Not on file   Highest education level: Not on file  Occupational History   Not on file  Tobacco Use   Smoking status: Former    Packs/day: 0.50    Years: 36.00    Total pack years: 18.00    Types: Cigarettes    Quit date: 09/28/2020    Years since quitting: 1.2   Smokeless tobacco: Never   Tobacco comments:    he denies smoking in about 2 weeks 07/13/18  Vaping Use   Vaping Use: Never used  Substance and Sexual Activity   Alcohol use: Yes    Alcohol/week: 6.0 standard drinks of alcohol    Types: 6 Cans of beer per week    Comment: weekends   Drug use: Yes    Types: Marijuana    Comment: reports cocaine usage ~2X/ month; last use 12/26/17   Sexual activity: Not on file  Other Topics Concern   Not on file  Social History Narrative   Not on file   Social Determinants of Health   Financial Resource Strain: Low Risk  (10/06/2020)   Overall Financial Resource Strain (CARDIA)    Difficulty of Paying Living Expenses: Not very hard  Food Insecurity: No Food Insecurity (10/06/2020)   Hunger Vital Sign     Worried About Running Out of Food in the Last Year: Never true    Ran Out of Food in the Last Year: Never true  Transportation Needs: No Transportation Needs (10/06/2020)   PRAPARE - Hydrologist (Medical): No    Lack of Transportation (Non-Medical): No  Physical Activity: Not on file  Stress: Not on file  Social Connections: Moderately Integrated (10/06/2020)   Social Connection and Isolation Panel [NHANES]    Frequency of Communication with Friends and Family: Three times a week    Frequency of Social Gatherings  with Friends and Family: Three times a week    Attends Religious Services: 1 to 4 times per year    Active Member of Clubs or Organizations: Yes    Attends Archivist Meetings: 1 to 4 times per year    Marital Status: Divorced  Intimate Partner Violence: Not At Risk (07/13/2018)   Humiliation, Afraid, Rape, and Kick questionnaire    Fear of Current or Ex-Partner: No    Emotionally Abused: No    Physically Abused: No    Sexually Abused: No    FAMILY HISTORY: Family History  Problem Relation Age of Onset   Breast cancer Mother    Diabetes Father    Hypertension Father    Stroke Father    Mental illness Sister    Hypertension Daughter    Mental illness Daughter    Hypertension Brother    Colon cancer Brother 57       passed away 23-Dec-2018   Breast cancer Sister    Esophageal cancer Neg Hx    Colon polyps Neg Hx    Rectal cancer Neg Hx    Stomach cancer Neg Hx     ALLERGIES:  is allergic to bee venom, antifungal [miconazole nitrate], and zolpidem tartrate er.  MEDICATIONS:  Current Outpatient Medications  Medication Sig Dispense Refill   amLODipine (NORVASC) 10 MG tablet TAKE 1 TABLET BY MOUTH EVERY DAY (Patient taking differently: Take 10 mg by mouth every evening.) 30 tablet 0   amoxicillin (AMOXIL) 875 MG tablet Take 1 tablet (875 mg total) by mouth 2 (two) times daily. 20 tablet 0   aspirin EC 81 MG tablet Take 81 mg by mouth  every evening. Swallow whole.     lisinopril (ZESTRIL) 20 MG tablet TAKE 1 TABLET BY MOUTH EVERY DAY (Patient taking differently: Take 20 mg by mouth every evening.) 30 tablet 0   Multiple Vitamins-Minerals (MULTI ADULT GUMMIES PO) Take 1 tablet by mouth in the morning. Centrum     Nutritional Supplements (FRUIT & VEGETABLE DAILY PO) Take 4 tablets by mouth daily in the afternoon.     tadalafil (CIALIS) 20 MG tablet Take 20 mg by mouth daily as needed for erectile dysfunction.     vitamin B-12 (CYANOCOBALAMIN) 1000 MCG tablet Take 1,000 mcg by mouth every evening. (Patient not taking: Reported on 08/27/2021)     No current facility-administered medications for this visit.    REVIEW OF SYSTEMS:   10 Point review of Systems was done is negative except as noted above. PHYSICAL EXAMINATION:  Vitals:   12/29/21 0857  BP: (!) 141/78  Pulse: 80  Resp: 19  Temp: 98.7 F (37.1 C)  SpO2: 94%    Filed Weights   12/29/21 0857  Weight: (!) 328 lb 4.8 oz (148.9 kg)  . NAD GENERAL:alert, in no acute distress and comfortable SKIN: no acute rashes, no significant lesions EYES: conjunctiva are pink and non-injected, sclera anicteric OROPHARYNX: MMM, no exudates, no oropharyngeal erythema or ulceration NECK: supple, no JVD LYMPH:  no palpable lymphadenopathy in the cervical, axillary or inguinal regions LUNGS: clear to auscultation b/l with normal respiratory effort HEART: regular rate & rhythm ABDOMEN:  normoactive bowel sounds , non tender, not distended. Extremity: no pedal edema PSYCH: alert & oriented x 3 with fluent speech NEURO: no focal motor/sensory deficits Please stop  LABORATORY DATA:   I have reviewed the data as listed    Latest Ref Rng & Units 12/29/2021    8:47 AM 12-22-2021  3:28 PM 07/08/2021    4:22 PM  CBC  WBC 4.0 - 10.5 K/uL 4.9  6.4  5.8   Hemoglobin 13.0 - 17.0 g/dL 11.6  12.2  11.9   Hematocrit 39.0 - 52.0 % 34.3  34.7  36.6   Platelets 150 - 400 K/uL 284   287  300    CBC    Component Value Date/Time   WBC 4.9 12/29/2021 0847   WBC 5.8 07/08/2021 1622   RBC 3.94 (L) 12/29/2021 0847   HGB 11.6 (L) 12/29/2021 0847   HCT 34.3 (L) 12/29/2021 0847   PLT 284 12/29/2021 0847   MCV 87.1 12/29/2021 0847   MCV 87.3 12/22/2017 1518   MCH 29.4 12/29/2021 0847   MCHC 33.8 12/29/2021 0847   RDW 13.6 12/29/2021 0847   LYMPHSABS 2.2 12/29/2021 0847   MONOABS 0.5 12/29/2021 0847   EOSABS 0.2 12/29/2021 0847   BASOSABS 0.1 12/29/2021 0847       Latest Ref Rng & Units 12/29/2021    8:47 AM 11/30/2021    3:28 PM 07/08/2021    4:22 PM  CMP  Glucose 70 - 99 mg/dL 154  236  181   BUN 8 - 23 mg/dL 15  16  23    Creatinine 0.61 - 1.24 mg/dL 0.91  1.17  1.03   Sodium 135 - 145 mmol/L 138  138  138   Potassium 3.5 - 5.1 mmol/L 4.2  3.8  4.0   Chloride 98 - 111 mmol/L 104  104  101   CO2 22 - 32 mmol/L 26  26  25    Calcium 8.9 - 10.3 mg/dL 9.4  9.1  9.8   Total Protein 6.5 - 8.1 g/dL 7.1  7.0    Total Bilirubin 0.3 - 1.2 mg/dL 0.7  0.4    Alkaline Phos 38 - 126 U/L 57  60    AST 15 - 41 U/L 17  15    ALT 0 - 44 U/L 15  11     . Lab Results  Component Value Date   LDH 172 12/29/2021   Tsh 1.42 (05/21/2021)   01/02/18 Left Inguinal LN Bx:   12/26/17 Tissue Flow Cytometry:   12/26/17 Inguinal Core biopsy:    RADIOGRAPHIC STUDIES: I have personally reviewed the radiological images as listed and agreed with the findings in the report. No results found.  ASSESSMENT & PLAN:   This is a pleasant 66 y.o. 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   CT of the abdomen and pelvis performed on 12/22/2017 showed bulky left inguinal, left hemipelvic, and  retroperitoneal adenopathy.      01/02/18 Left inguinal LN Biopsy revealed T-Cell/histocyte rich Large B-Cell Lymphoma   12/27/17 ECHO revealed LV EF of 55-60%    01/05/18 PET/CT revealed Massively enlarged pelvic lymph nodes intense metabolic activity consistent lymphoma. 2. Additional hypermetabolic lymph nodes in the porta hepatis and retroperitoneum LEFT aorta. 3. Solitary hypermetabolic mediastinal lymph node in the upper LEFT Mediastinum. 4. 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 5. Normal spleen.     HIV non reactive on 12/22/2017.  Hep C and hep B serology negative.   03/14/18 PET/CT revealed PET-CT findings suggest an excellent response to chemotherapy. The abdominal lymphadenopathy has near completely resolved and  demonstrates a near complete metabolic response. The pelvic and inguinal adenopathy has significantly decreased in size and the metabolic activity has significantly decreased. 2. Diffuse marrow activity likely due to chemotherapy and or marrow stimulating drugs. I do not see any discrete persistent lesions.    04/17/18 CT Head revealed Subtle mesial caudothalamic hypodensities may be artifact though, the could reflect encephalitis or Wernicke's encephalopathy. Consider MRI of the head with and without contrast. 2. Mild chronic small vessel ischemic changes    S/p 6 cycles of EPOCH-R completed on 06/01/18  06/28/18 PET/CT revealed Continued good response to treatment. No residual measurable or hypermetabolic abdominal lymphadenopathy and no recurrent osseous disease. 2. Interval decrease in size of the left operator region lymph node and also the left inguinal lymph node. However, the both have small foci of slightly increased hypermetabolism which bears surveillance. 3. No new or progressive lymphadenopathy in the neck, chest, abdomen or pelvis.  S/p 39.6 Gy in 22 fractions between 07/25/18 and 08/23/18  02/13/2019 CT C/A/P ([id number]) ([id number]) revealed "1. Response  to therapy of pelvic adenopathy compared to the PET of 06/28/2018. 2. No new or progressive disease. 3. Mild prostatomegaly. 4. 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 5. Hepatomegaly."   2) left lower extremity swelling- now resolved  Doppler ultrasound for DVT was negative in the left lower extremity.   Likely from venous compression +/- lymphatic obstruction from bulky left inguinal, left hemipelvic, and  retroperitoneal adenopathy.    3) S/p Port a cath placement - removed 03/11/2019  PLAN: -Labs done today show stable CBC and CMP and normal LDH Patient has no lab or clinical symptoms suggestive of lymphoma recurrence/progression at this time  4) vocal cord squamous cell carcinoma status post definitive radiation TSH within normal limits today Continue follow-up with ENT and radiation oncology for surveillance  FOLLOW UP: RTC with Dr Irene Limbo with labs in 6 months  The total time spent in the appointment was 20 minutes*.  All of the patient's questions were answered with apparent satisfaction. The patient knows to call the clinic with any problems, questions or concerns.   Sullivan Lone MD MS AAHIVMS [facility] Hematology/Oncology Physician [facility]  .*Total Encounter Time as defined by the Centers for Medicare and Medicaid Services includes, in addition to the face-to-face time of a patient visit (documented in the note above) non-face-to-face time: obtaining and reviewing outside history, ordering and reviewing medications, tests or procedures, care coordination (communications with other health care professionals or caregivers) and documentation in the medical record.

## 2022-01-21 ENCOUNTER — Other Ambulatory Visit: Payer: Self-pay | Admitting: Hematology

## 2022-01-21 ENCOUNTER — Encounter: Payer: Self-pay | Admitting: Hematology

## 2022-01-21 DIAGNOSIS — R1012 Left upper quadrant pain: Secondary | ICD-10-CM

## 2022-01-21 DIAGNOSIS — C8330 Diffuse large B-cell lymphoma, unspecified site: Secondary | ICD-10-CM

## 2022-01-21 DIAGNOSIS — R0789 Other chest pain: Secondary | ICD-10-CM

## 2022-01-27 ENCOUNTER — Other Ambulatory Visit: Payer: Self-pay | Admitting: Otolaryngology

## 2022-01-31 ENCOUNTER — Ambulatory Visit (HOSPITAL_COMMUNITY)
Admission: RE | Admit: 2022-01-31 | Discharge: 2022-01-31 | Disposition: A | Discharge status: Home / Self Care | Payer: No Typology Code available for payment source | Source: Ambulatory Visit | Attending: Hematology | Admitting: Hematology

## 2022-01-31 ENCOUNTER — Ambulatory Visit (HOSPITAL_COMMUNITY): Payer: No Typology Code available for payment source

## 2022-01-31 DIAGNOSIS — R0789 Other chest pain: Secondary | ICD-10-CM | POA: Insufficient documentation

## 2022-01-31 DIAGNOSIS — K429 Umbilical hernia without obstruction or gangrene: Secondary | ICD-10-CM | POA: Diagnosis not present

## 2022-01-31 DIAGNOSIS — R1012 Left upper quadrant pain: Secondary | ICD-10-CM | POA: Insufficient documentation

## 2022-01-31 DIAGNOSIS — R160 Hepatomegaly, not elsewhere classified: Secondary | ICD-10-CM | POA: Diagnosis not present

## 2022-01-31 DIAGNOSIS — Z8572 Personal history of non-Hodgkin lymphomas: Secondary | ICD-10-CM | POA: Diagnosis not present

## 2022-01-31 DIAGNOSIS — K3189 Other diseases of stomach and duodenum: Secondary | ICD-10-CM | POA: Diagnosis not present

## 2022-01-31 DIAGNOSIS — J432 Centrilobular emphysema: Secondary | ICD-10-CM | POA: Diagnosis not present

## 2022-01-31 DIAGNOSIS — I700 Atherosclerosis of aorta: Secondary | ICD-10-CM | POA: Diagnosis not present

## 2022-01-31 MED ORDER — IOHEXOL 300 MG/ML  SOLN
100.0000 mL | Freq: Once | INTRAMUSCULAR | Status: AC | PRN
  Administered 2022-01-31: 100 mL via INTRAVENOUS

## 2022-02-02 NOTE — Anesthesia Preprocedure Evaluation (Addendum)
Anesthesia Evaluation  Patient identified by MRN, date of birth, ID band Patient awake    Reviewed: Allergy & Precautions, H&P , NPO status , Patient's Chart, lab work & pertinent test results  Airway Mallampati: II  TM Distance: >3 FB Neck ROM: Full    Dental no notable dental hx. (+) Teeth Intact, Dental Advisory Given   Pulmonary neg pulmonary ROS, Patient abstained from smoking., former smoker,    Pulmonary exam normal breath sounds clear to auscultation       Cardiovascular hypertension, Pt. on medications + Peripheral Vascular Disease   Rhythm:Regular Rate:Normal  Nuclear stress test 09/21/21: . Findings are consistent with no prior ischemia and no prior myocardial infarction. The study is low risk. . No ST deviation was noted. . Left ventricular function is low normal. Nuclear stress EF: 52 %. The left ventricular ejection fraction is mildly decreased (45-54%). End diastolic cavity size is normal. . Prior study not available for comparison.   Echo 09/20/21: IMPRESSIONS  1. Left ventricular ejection fraction, by estimation, is 55 to 60%. The  left ventricle has normal function. The left ventricle has no regional  wall motion abnormalities. There is moderate concentric left ventricular  hypertrophy. Left ventricular  diastolic parameters were normal.  2. Right ventricular systolic function is normal. The right ventricular  size is normal. There is normal pulmonary artery systolic pressure. The  estimated right ventricular systolic pressure is 21.0 mmHg.  3. The mitral valve is grossly normal. No evidence of mitral valve  regurgitation. No evidence of mitral stenosis.  4. The aortic valve is tricuspid. Aortic valve regurgitation is not  visualized. No aortic stenosis is present.  5. The inferior vena cava is dilated in size with >50% respiratory  variability, suggesting right atrial pressure of 8 mmHg.  -  Comparison(s): No significant change from prior study.    Neuro/Psych negative neurological ROS  negative psych ROS   GI/Hepatic Neg liver ROS, GERD  ,  Endo/Other  diabetes, Type 2, Oral Hypoglycemic AgentsMorbid obesity  Renal/GU negative Renal ROS  negative genitourinary   Musculoskeletal  (+) Arthritis , Osteoarthritis,    Abdominal   Peds  Hematology  (+) Blood dyscrasia, anemia ,   Anesthesia Other Findings   Reproductive/Obstetrics negative OB ROS                           Anesthesia Physical Anesthesia Plan  ASA: 3  Anesthesia Plan: General   Post-op Pain Management: Tylenol PO (pre-op)*   Induction: Intravenous  PONV Risk Score and Plan: 3 and Ondansetron, Dexamethasone and Midazolam  Airway Management Planned: Oral ETT  Additional Equipment:   Intra-op Plan:   Post-operative Plan: Extubation in OR  Informed Consent: I have reviewed the patients History and Physical, chart, labs and discussed the procedure including the risks, benefits and alternatives for the proposed anesthesia with the patient or authorized representative who has indicated his/her understanding and acceptance.     Dental advisory given  Plan Discussed with: CRNA  Anesthesia Plan Comments: (PAT note written 02/02/2022 by Shonna Chock, PA-C. )       Anesthesia Quick Evaluation

## 2022-02-02 NOTE — Pre-Procedure Instructions (Signed)
PCP -  Cardiologist -  EKG - n/a Chest x-ray - 01/31/22 ECHO - 09/20/21 Cardiac Cath - n/a CPAP -  Fasting Blood Sugar:   Checks Blood Sugar:  /day  Blood Thinner Instructions: n/a Aspirin Instructions:  ERAS Protcol - yes 1000 COVID TEST- n/a  Anesthesia review: yes  -------------  SDW INSTRUCTIONS:  Your procedure is scheduled on 02/03/22. Please report to [facility] Main Entrance "A" at 1030 A.M., and check in at the Admitting office. Call this number if you have problems the morning of surgery: [phone]   Remember: Do not eat  after midnight the night before your surgery  You may drink clear liquids until 1000 the morning of your surgery.   Clear liquids allowed are: Water, Non-Citrus Juices (without pulp), Carbonated Beverages, Clear Tea, Black Coffee Only, and Gatorade   Medications to take morning of surgery with a sip of water include: amlodipine  As of today, STOP taking any Aspirin (unless otherwise instructed by your surgeon), Aleve, Naproxen, Ibuprofen, Motrin, Advil, Goody's, BC's, all herbal medications, fish oil, and all vitamins.  WHAT DO I DO ABOUT MY DIABETES MEDICATION?   Do not take METFORMIN the morning of surgery.  HOW TO MANAGE YOUR DIABETES BEFORE AND AFTER SURGERY  Why is it important to control my blood sugar before and after surgery? Improving blood sugar levels before and after surgery helps healing and can limit problems. A way of improving blood sugar control is eating a healthy diet by:  Eating less sugar and carbohydrates  Increasing activity/exercise  Talking with your doctor about reaching your blood sugar goals High blood sugars (greater than 180 mg/dL) can raise your risk of infections and slow your recovery, so you will need to focus on controlling your diabetes during the weeks before surgery. Make sure that the doctor who takes care of your diabetes knows about your planned surgery including the date and location.  How do I  manage my blood sugar before surgery? Check your blood sugar at least 4 times a day, starting 2 days before surgery, to make sure that the level is not too high or low.  Check your blood sugar the morning of your surgery when you wake up and every 2 hours until you get to the Short Stay unit.  If your blood sugar is less than 70 mg/dL, you will need to treat for low blood sugar: Do not take insulin. Treat a low blood sugar (less than 70 mg/dL) with  cup of clear juice (cranberry or apple), 4 glucose tablets, OR glucose gel. Recheck blood sugar in 15 minutes after treatment (to make sure it is greater than 70 mg/dL). If your blood sugar is not greater than 70 mg/dL on recheck, call [phone] for further instructions. Report your blood sugar to the short stay nurse when you get to Short Stay.  If you are admitted to the hospital after surgery: Your blood sugar will be checked by the staff and you will probably be given insulin after surgery (instead of oral diabetes medicines) to make sure you have good blood sugar levels. The goal for blood sugar control after surgery is 80-180 mg/dL.    The Morning of Surgery Do not wear jewelry, make-up or nail polish. Do not wear lotions, powders, or perfumes/colognes, or deodorant Do not bring valuables to the hospital. [facility] is not responsible for any belongings or valuables.  If you are a smoker, DO NOT Smoke 24 hours prior to surgery  If you  wear a CPAP at night please bring your mask the morning of surgery   Remember that you must have someone to transport you home after your surgery, and remain with you for 24 hours if you are discharged the same day.  Please bring cases for contacts, glasses, hearing aids, dentures or bridgework because it cannot be worn into surgery.   Patients discharged the day of surgery will not be allowed to drive home.   Please shower the NIGHT BEFORE/MORNING OF SURGERY (use antibacterial soap like DIAL soap  if possible). Wear comfortable clothes the morning of surgery. Oral Hygiene is also important to reduce your risk of infection.  Remember - BRUSH YOUR TEETH THE MORNING OF SURGERY WITH YOUR REGULAR TOOTHPASTE  Patient denies shortness of breath, fever, cough and chest pain.

## 2022-02-02 NOTE — Progress Notes (Signed)
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   Last cardiology visit with Dr. Johnsie Cancel was on 08/27/21 for dizziness. Did not sound like cardiac etiology. He did have bifascicular block, so recommended yearly EKGs. Echo and stress test ordered which were reassuring. As needed cardiology follow-up.   He is a same-day work-up, so anesthesia team to evaluate on the day of surgery.   VS:  BP Readings from Last 3 Encounters:  12/29/21 (!) 141/78  11/30/21 138/81  09/27/21 138/83   Pulse Readings from Last 3 Encounters:  12/29/21 80  11/30/21 (!) 102  09/27/21 95     PROVIDERS: Shirline Frees, MD is PCP  Sullivan Lone, MD is HEM-ONC Eppie Gibson, MD is RAD-ONC Jenkins Rouge, MD is cardiologist. As needed follow-up recommended at 08/27/21 pending testing results.    LABS: Labs on arrival as indicated.  Most recent results in [facility] from 12/29/2021 include: Lab Results  Component Value Date   WBC 4.9 12/29/2021   HGB 11.6 (L) 12/29/2021   HCT 34.3 (L) 12/29/2021   PLT 284 12/29/2021   GLUCOSE 154 (H) 12/29/2021   ALT 15 12/29/2021   AST 17 12/29/2021   NA 138 12/29/2021   K 4.2 12/29/2021   CL 104 12/29/2021   CREATININE 0.91 12/29/2021   BUN 15 12/29/2021   CO2 26 12/29/2021   TSH  1.356 12/29/2021    IMAGES: CT Chest/abd/pelvis 01/31/22: IMPRESSION: 1. No explanation for patient's symptoms and no evidence of recurrent lymphoma. 2. Aortic atherosclerosis (ICD10-I70.0) and emphysema (ICD10-J43.9). 3. Mild hepatomegaly and prostatomegaly. 4. Pelvic Paget's disease, as before   CT Head 07/08/21: IMPRESSION: No acute intracranial abnormality.    EKG: 07/08/21: Sinus rhythm with Premature atrial complexes with Abberant conduction Left axis deviation Right bundle branch block Minimal voltage criteria for LVH, may be normal variant Possible Lateral infarct , age undetermined Abnormal ECG Premature ventricular complexes , new Confirmed by Shelva Majestic [phone]) on 07/08/2021 6:21:20 PM   CV: Nuclear stress test 09/21/21:   Findings are consistent with no prior ischemia and no prior myocardial infarction. The study is low risk.   No ST deviation was noted.   Left ventricular function is low normal. Nuclear stress EF: 52 %. The left ventricular ejection fraction is mildly decreased (45-54%). End diastolic cavity size is normal.   Prior study not available for comparison.   Echo 09/20/21: IMPRESSIONS   1. Left ventricular ejection fraction, by estimation, is 55 to 60%. The  left ventricle has normal function. The left ventricle has no regional  wall motion abnormalities. There is moderate concentric left ventricular  hypertrophy. Left ventricular  diastolic parameters were normal.   2. Right ventricular systolic function is normal. The right ventricular  size is normal. There is normal pulmonary artery systolic pressure. The  estimated right ventricular systolic pressure is  21.0 mmHg.   3. The mitral valve is grossly normal. No evidence of mitral valve  regurgitation. No evidence of mitral stenosis.   4. The aortic valve is tricuspid. Aortic valve regurgitation is not  visualized. No aortic stenosis is present.   5. The inferior vena cava is dilated in size with  >50% respiratory  variability, suggesting right atrial pressure of 8 mmHg.  - Comparison(s): No significant change from prior study.    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- last radiation 06-2018   Cataract    removed both eyes with l;ens implants    Family history of colon cancer    in his brother- dx'd age 59    History of chemotherapy    last 06-01-2018   History of kidney stones    History of radiation therapy    last radiation 06-2018   Hyperlipidemia    currently under control   Hypertension    Irregular heart beats    Lymphadenopathy    Pain, lower leg    Bilateral   Peripheral arterial disease (HCC)    Pre-diabetes    Red-green color blindness    RLS (restless legs syndrome)    Snores    Wears glasses     Past Surgical History:  Procedure Laterality Date   CATARACT EXTRACTION W/ INTRAOCULAR LENS  IMPLANT, BILATERAL     COLONOSCOPY     dislodged salava stone     FRACTURE SURGERY     HAND ARTHROPLASTY  1995   crushed left hand   INGUINAL LYMPH NODE BIOPSY Left 01/02/2018   Procedure: LEFT INGUINAL LYMPH NODE BIOPSY;  Surgeon: Rolm Bookbinder, MD;  Location: [facility];  Service: General;  Laterality: Left;   IR IMAGING GUIDED PORT INSERTION  01/15/2018   IR REMOVAL TUN ACCESS W/ PORT W/O FL MOD SED  03/11/2019   MICROLARYNGOSCOPY Left 01/17/2014   Procedure: MICROLARYNGOSCOPY WITH EXCISION OF THE BIOPSY OF LEFT VOCAL CORD LESION;  Surgeon: Izora Gala, MD;  Location: [facility];  Service: ENT;  Laterality: Left;   MICROLARYNGOSCOPY N/A 09/16/2020   Procedure: MICROLARYNGOSCOPY with Biopsy of vocal cord lesion;  Surgeon: Jerrell Belfast, MD;  Location: [facility];  Service: ENT;  Laterality: N/A;   ORIF FOOT FRACTURE  2005   left   REFRACTIVE SURGERY Right     removed cloudiness in right eye after cataract removal     MEDICATIONS: No current facility-administered medications for this encounter.    amLODipine (NORVASC) 10 MG tablet   aspirin EC 81 MG tablet   b complex vitamins capsule   lisinopril (ZESTRIL) 20 MG tablet   metFORMIN (GLUCOPHAGE) 500 MG tablet   Multiple Vitamins-Minerals (MULTI ADULT GUMMIES PO)   OVER THE COUNTER MEDICATION   tadalafil (CIALIS) 20 MG tablet    Myra Gianotti, PA-C Surgical Short Stay/Anesthesiology [facility] Phone [phone] [facility] Phone [phone] 02/02/2022 10:56 AM

## 2022-02-03 ENCOUNTER — Ambulatory Visit (HOSPITAL_BASED_OUTPATIENT_CLINIC_OR_DEPARTMENT_OTHER): Payer: No Typology Code available for payment source | Admitting: Vascular Surgery

## 2022-02-03 ENCOUNTER — Encounter (HOSPITAL_COMMUNITY)
Admission: RE | Disposition: A | Discharge status: Home / Self Care | Payer: Self-pay | Source: Home / Self Care | Attending: Otolaryngology

## 2022-02-03 ENCOUNTER — Ambulatory Visit (HOSPITAL_COMMUNITY)
Admission: RE | Admit: 2022-02-03 | Discharge: 2022-02-03 | Disposition: A | Discharge status: Home / Self Care | Payer: No Typology Code available for payment source | Attending: Otolaryngology | Admitting: Otolaryngology

## 2022-02-03 ENCOUNTER — Other Ambulatory Visit: Payer: Self-pay

## 2022-02-03 ENCOUNTER — Ambulatory Visit (HOSPITAL_COMMUNITY): Payer: No Typology Code available for payment source | Admitting: Vascular Surgery

## 2022-02-03 ENCOUNTER — Encounter (HOSPITAL_COMMUNITY): Payer: Self-pay | Admitting: Otolaryngology

## 2022-02-03 DIAGNOSIS — M1990 Unspecified osteoarthritis, unspecified site: Secondary | ICD-10-CM | POA: Insufficient documentation

## 2022-02-03 DIAGNOSIS — Z6841 Body Mass Index (BMI) 40.0 and over, adult: Secondary | ICD-10-CM | POA: Insufficient documentation

## 2022-02-03 DIAGNOSIS — E1151 Type 2 diabetes mellitus with diabetic peripheral angiopathy without gangrene: Secondary | ICD-10-CM

## 2022-02-03 DIAGNOSIS — J387 Other diseases of larynx: Secondary | ICD-10-CM | POA: Diagnosis not present

## 2022-02-03 DIAGNOSIS — Z8521 Personal history of malignant neoplasm of larynx: Secondary | ICD-10-CM | POA: Diagnosis not present

## 2022-02-03 DIAGNOSIS — Z923 Personal history of irradiation: Secondary | ICD-10-CM | POA: Insufficient documentation

## 2022-02-03 DIAGNOSIS — Z7984 Long term (current) use of oral hypoglycemic drugs: Secondary | ICD-10-CM

## 2022-02-03 DIAGNOSIS — K219 Gastro-esophageal reflux disease without esophagitis: Secondary | ICD-10-CM | POA: Insufficient documentation

## 2022-02-03 DIAGNOSIS — I10 Essential (primary) hypertension: Secondary | ICD-10-CM | POA: Diagnosis not present

## 2022-02-03 DIAGNOSIS — Z79899 Other long term (current) drug therapy: Secondary | ICD-10-CM | POA: Diagnosis not present

## 2022-02-03 DIAGNOSIS — R490 Dysphonia: Secondary | ICD-10-CM | POA: Diagnosis not present

## 2022-02-03 DIAGNOSIS — J383 Other diseases of vocal cords: Secondary | ICD-10-CM | POA: Diagnosis not present

## 2022-02-03 DIAGNOSIS — Z87891 Personal history of nicotine dependence: Secondary | ICD-10-CM | POA: Diagnosis not present

## 2022-02-03 HISTORY — PX: DIRECT LARYNGOSCOPY: SHX5326

## 2022-02-03 LAB — BASIC METABOLIC PANEL
Anion gap: 12 (ref 5–15)
BUN: 15 mg/dL (ref 8–23)
CO2: 22 mmol/L (ref 22–32)
Calcium: 9.5 mg/dL (ref 8.9–10.3)
Chloride: 103 mmol/L (ref 98–111)
Creatinine, Ser: 1.12 mg/dL (ref 0.61–1.24)
GFR, Estimated: >60 mL/min (ref >60)
Glucose, Bld: 190 mg/dL — ABNORMAL HIGH (ref 70–99)
Potassium: 4.3 mmol/L (ref 3.5–5.1)
Sodium: 137 mmol/L (ref 135–145)

## 2022-02-03 LAB — CBC
HCT: 37.3 % — ABNORMAL LOW (ref 39.0–52.0)
Hemoglobin: 12.1 g/dL — ABNORMAL LOW (ref 13.0–17.0)
MCH: 29.8 pg (ref 26.0–34.0)
MCHC: 32.4 g/dL (ref 30.0–36.0)
MCV: 91.9 fL (ref 80.0–100.0)
Platelets: 295 10*3/uL (ref 150–400)
RBC: 4.06 MIL/uL — ABNORMAL LOW (ref 4.22–5.81)
RDW: 13.8 % (ref 11.5–15.5)
WBC: 6 10*3/uL (ref 4.0–10.5)
nRBC: 0 % (ref 0.0–0.2)

## 2022-02-03 LAB — GLUCOSE, CAPILLARY: Glucose-Capillary: 140 mg/dL — ABNORMAL HIGH (ref 70–99)

## 2022-02-03 SURGERY — LARYNGOSCOPY, DIRECT
Anesthesia: General | Site: Mouth | Laterality: Right

## 2022-02-03 MED ORDER — CHLORHEXIDINE GLUCONATE 0.12 % MT SOLN
15.0000 mL | Freq: Once | OROMUCOSAL | Status: AC
  Administered 2022-02-03: 15 mL via OROMUCOSAL
  Filled 2022-02-03: qty 15

## 2022-02-03 MED ORDER — MIDAZOLAM HCL 5 MG/5ML IJ SOLN
INTRAMUSCULAR | Status: DC | PRN
  Administered 2022-02-03: 2 mg via INTRAVENOUS

## 2022-02-03 MED ORDER — ONDANSETRON HCL 4 MG/2ML IJ SOLN
INTRAMUSCULAR | Status: DC | PRN
  Administered 2022-02-03: 4 mg via INTRAVENOUS

## 2022-02-03 MED ORDER — HYDROMORPHONE HCL 1 MG/ML IJ SOLN: 0.2500 mg | INTRAMUSCULAR | Status: DC | PRN

## 2022-02-03 MED ORDER — LACTATED RINGERS IV SOLN: INTRAVENOUS | Status: DC

## 2022-02-03 MED ORDER — LIDOCAINE 2% (20 MG/ML) 5 ML SYRINGE
INTRAMUSCULAR | Status: AC
  Filled 2022-02-03: qty 10

## 2022-02-03 MED ORDER — SUCCINYLCHOLINE CHLORIDE 200 MG/10ML IV SOSY
PREFILLED_SYRINGE | INTRAVENOUS | Status: DC | PRN
  Administered 2022-02-03: 140 mg via INTRAVENOUS

## 2022-02-03 MED ORDER — FENTANYL CITRATE (PF) 250 MCG/5ML IJ SOLN
INTRAMUSCULAR | Status: AC
  Filled 2022-02-03: qty 5

## 2022-02-03 MED ORDER — PROPOFOL 10 MG/ML IV BOLUS
INTRAVENOUS | Status: DC | PRN
  Administered 2022-02-03 (×2): 50 mg via INTRAVENOUS
  Administered 2022-02-03: 150 mg via INTRAVENOUS

## 2022-02-03 MED ORDER — MIDAZOLAM HCL 2 MG/2ML IJ SOLN
INTRAMUSCULAR | Status: AC
  Filled 2022-02-03: qty 2

## 2022-02-03 MED ORDER — LIDOCAINE 2% (20 MG/ML) 5 ML SYRINGE
INTRAMUSCULAR | Status: DC | PRN
  Administered 2022-02-03: 60 mg via INTRAVENOUS

## 2022-02-03 MED ORDER — PROPOFOL 10 MG/ML IV BOLUS
INTRAVENOUS | Status: AC
  Filled 2022-02-03: qty 20

## 2022-02-03 MED ORDER — SUCCINYLCHOLINE CHLORIDE 200 MG/10ML IV SOSY
PREFILLED_SYRINGE | INTRAVENOUS | Status: AC
  Filled 2022-02-03: qty 10

## 2022-02-03 MED ORDER — DEXAMETHASONE SODIUM PHOSPHATE 10 MG/ML IJ SOLN
INTRAMUSCULAR | Status: AC
  Filled 2022-02-03: qty 1

## 2022-02-03 MED ORDER — LIDOCAINE HCL 2 % IJ SOLN
INTRAMUSCULAR | Status: DC | PRN
  Administered 2022-02-03: 5 mL

## 2022-02-03 MED ORDER — ONDANSETRON HCL 4 MG/2ML IJ SOLN
INTRAMUSCULAR | Status: AC
  Filled 2022-02-03: qty 2

## 2022-02-03 MED ORDER — 0.9 % SODIUM CHLORIDE (POUR BTL) OPTIME
TOPICAL | Status: DC | PRN
  Administered 2022-02-03: 500 mL

## 2022-02-03 MED ORDER — ORAL CARE MOUTH RINSE: 15.0000 mL | Freq: Once | OROMUCOSAL | Status: AC

## 2022-02-03 MED ORDER — FENTANYL CITRATE (PF) 100 MCG/2ML IJ SOLN
INTRAMUSCULAR | Status: DC | PRN
  Administered 2022-02-03: 50 ug via INTRAVENOUS

## 2022-02-03 MED ORDER — EPINEPHRINE HCL (NASAL) 0.1 % NA SOLN
NASAL | Status: AC
  Filled 2022-02-03: qty 30

## 2022-02-03 MED ORDER — DEXAMETHASONE SODIUM PHOSPHATE 4 MG/ML IJ SOLN
INTRAMUSCULAR | Status: DC | PRN
  Administered 2022-02-03: 4 mg via INTRAVENOUS

## 2022-02-03 MED ORDER — ACETAMINOPHEN 500 MG PO TABS
1000.0000 mg | ORAL_TABLET | Freq: Once | ORAL | Status: AC
  Administered 2022-02-03: 1000 mg via ORAL
  Filled 2022-02-03: qty 2

## 2022-02-03 SURGICAL SUPPLY — 31 items
BAG COUNTER SPONGE SURGICOUNT (BAG) ×1 IMPLANT
BALLN PULM 15 16.5 18X75 (BALLOONS)
BALLOON PULM 15 16.5 18X75 (BALLOONS) IMPLANT
CANISTER SUCT 3000ML PPV (MISCELLANEOUS) ×1 IMPLANT
CNTNR URN SCR LID CUP LEK RST (MISCELLANEOUS) IMPLANT
CONT SPEC 4OZ STRL OR WHT (MISCELLANEOUS) ×1
COVER BACK TABLE 60X90IN (DRAPES) ×1 IMPLANT
COVER MAYO STAND STRL (DRAPES) ×1 IMPLANT
DRAPE HALF SHEET 40X57 (DRAPES) ×1 IMPLANT
DRSG TELFA 3X8 NADH STRL (GAUZE/BANDAGES/DRESSINGS) IMPLANT
GAUZE 4X4 16PLY ~~LOC~~+RFID DBL (SPONGE) ×1 IMPLANT
GLOVE BIOGEL M 7.0 STRL (GLOVE) ×1 IMPLANT
GUARD TEETH (MISCELLANEOUS) IMPLANT
KIT BASIN OR (CUSTOM PROCEDURE TRAY) ×1 IMPLANT
KIT TURNOVER KIT B (KITS) ×1 IMPLANT
MAT PREVALON FULL STRYKER (MISCELLANEOUS) IMPLANT
NDL 18GX1X1/2 (RX/OR ONLY) (NEEDLE) IMPLANT
NDL HYPO 25GX1X1/2 BEV (NEEDLE) IMPLANT
NEEDLE 18GX1X1/2 (RX/OR ONLY) (NEEDLE) IMPLANT
NEEDLE HYPO 25GX1X1/2 BEV (NEEDLE) IMPLANT
NS IRRIG 1000ML POUR BTL (IV SOLUTION) ×1 IMPLANT
PAD ARMBOARD 7.5X6 YLW CONV (MISCELLANEOUS) ×2 IMPLANT
PATTIES SURGICAL .5 X3 (DISPOSABLE) IMPLANT
SOL ANTI FOG 6CC (MISCELLANEOUS) IMPLANT
SOLUTION ANTI FOG 6CC (MISCELLANEOUS)
SOLUTION BUTLER CLEAR DIP (MISCELLANEOUS) IMPLANT
SURGILUBE 2OZ TUBE FLIPTOP (MISCELLANEOUS) ×1 IMPLANT
TOWEL GREEN STERILE FF (TOWEL DISPOSABLE) ×1 IMPLANT
TUBE CONNECTING 12X1/4 (SUCTIONS) ×1 IMPLANT
WATER STERILE IRR 1000ML POUR (IV SOLUTION) ×1 IMPLANT
YANKAUER SUCT BULB TIP NO VENT (SUCTIONS) IMPLANT

## 2022-02-03 NOTE — H&P (Signed)
Jesus [name] is an 66 y.o. male.   Chief Complaint: Supraglottic lesion HPI: Patient with a history of laryngeal carcinoma treated with radiation therapy through the [facility] with Dr. Isidore Moos.  He completed treatment in February 2020.  Follow-up examination showed a small supraglottic lesion superior to the vallecula him.  Patient is asymptomatic and normal respiration.  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- last radiation 06-2018   Cataract    removed both eyes with l;ens implants    Family history of colon cancer    in his brother- dx'd age 74    History of chemotherapy    last 06-01-2018   History of kidney stones    History of radiation therapy    last radiation 06-2018   Hyperlipidemia    currently under control   Hypertension    Irregular heart beats    Lymphadenopathy    Pain, lower leg    Bilateral   Peripheral arterial disease (HCC)    Pre-diabetes    Red-green color blindness    RLS (restless legs syndrome)    Snores    Wears glasses     Past Surgical History:  Procedure Laterality Date   CATARACT EXTRACTION W/ INTRAOCULAR LENS  IMPLANT, BILATERAL     COLONOSCOPY     dislodged salava stone     FRACTURE SURGERY     HAND ARTHROPLASTY  1995   crushed left hand   INGUINAL LYMPH NODE BIOPSY Left 01/02/2018   Procedure: LEFT INGUINAL LYMPH NODE BIOPSY;  Surgeon: Rolm Bookbinder, MD;  Location: [facility];  Service: General;  Laterality: Left;   IR IMAGING GUIDED PORT INSERTION  01/15/2018   IR REMOVAL TUN ACCESS W/ PORT W/O FL MOD SED  03/11/2019   MICROLARYNGOSCOPY Left 01/17/2014   Procedure: MICROLARYNGOSCOPY WITH EXCISION OF THE BIOPSY OF LEFT VOCAL CORD LESION;  Surgeon: Izora Gala, MD;  Location: [facility];  Service: ENT;  Laterality: Left;    MICROLARYNGOSCOPY N/A 09/16/2020   Procedure: MICROLARYNGOSCOPY with Biopsy of vocal cord lesion;  Surgeon: Jerrell Belfast, MD;  Location: [facility] OR;  Service: ENT;  Laterality: N/A;   ORIF FOOT FRACTURE  2005   left   REFRACTIVE SURGERY Right    removed cloudiness in right eye after cataract removal     Family History  Problem Relation Age of Onset   Breast cancer Mother    Diabetes Father    Hypertension Father    Stroke Father    Mental illness Sister    Hypertension Daughter    Mental illness Daughter    Hypertension Brother    Colon cancer Brother 18       passed away Dec 06, 2018   Breast cancer Sister    Esophageal cancer Neg Hx    Colon polyps Neg Hx    Rectal cancer Neg Hx    Stomach cancer Neg Hx    Social History:  reports that he quit smoking about 16 months ago. His smoking use included cigarettes. He has a 18.00 pack-year smoking history. He has never used smokeless tobacco. He reports current alcohol use of about 6.0 standard drinks of alcohol per week. He reports current drug use. Drugs:  and Marijuana.  Allergies:  Allergies  Allergen Reactions   Bee Venom Anaphylaxis   Antifungal [Miconazole Nitrate] Other (See Comments)    Other reaction(s): made his head hurt   Zolpidem Tartrate Er Other (See Comments)    Leg cramps    Medications Prior to Admission  Medication Sig Dispense Refill   amLODipine (NORVASC) 10 MG tablet TAKE 1 TABLET BY MOUTH EVERY DAY (Patient taking differently: Take 10 mg by mouth every evening.) 30 tablet 0   aspirin EC 81 MG tablet Take 81 mg by mouth every evening. Swallow whole.     b complex vitamins capsule Take 1 capsule by mouth daily.     lisinopril (ZESTRIL) 20 MG tablet TAKE 1 TABLET BY MOUTH EVERY DAY (Patient taking differently: Take 20 mg by mouth every evening.) 30 tablet 0   metFORMIN (GLUCOPHAGE) 500 MG tablet Take 500 mg by mouth daily.     Multiple Vitamins-Minerals (MULTI ADULT GUMMIES PO) Take 1 tablet by mouth in the  morning. Centrum     OVER THE COUNTER MEDICATION Take 1 tablet by mouth daily. Immune plus     tadalafil (CIALIS) 20 MG tablet Take 20 mg by mouth daily as needed for erectile dysfunction.      Results for orders placed or performed during the hospital encounter of 02/03/22 (from the past 48 hour(s))  Basic metabolic panel per protocol     Status: Abnormal   Collection Time: 02/03/22 11:14 AM  Result Value Ref Range   Sodium 137 135 - 145 mmol/L   Potassium 4.3 3.5 - 5.1 mmol/L    Comment: HEMOLYSIS AT THIS LEVEL MAY AFFECT RESULT   Chloride 103 98 - 111 mmol/L   CO2 22 22 - 32 mmol/L   Glucose, Bld 190 (H) 70 - 99 mg/dL    Comment: Glucose reference range applies only to samples taken after fasting for at least 8 hours.   BUN 15 8 - 23 mg/dL   Creatinine, Ser 1.12 0.61 - 1.24 mg/dL   Calcium 9.5 8.9 - 10.3 mg/dL   GFR, Estimated >60 >60 mL/min    Comment: (NOTE) Calculated using the CKD-EPI Creatinine Equation (2021)    Anion gap 12 5 - 15    Comment: Performed at [facility] [address] [place], [place] [address]  CBC per protocol     Status: Abnormal   Collection Time: 02/03/22 11:14 AM  Result Value Ref Range   WBC 6.0 4.0 - 10.5 K/uL   RBC 4.06 (L) 4.22 - 5.81 MIL/uL   Hemoglobin 12.1 (L) 13.0 - 17.0 g/dL   HCT 37.3 (L) 39.0 - 52.0 %   MCV 91.9 80.0 - 100.0 fL   MCH 29.8 26.0 - 34.0 pg   MCHC 32.4 30.0 - 36.0 g/dL   RDW 13.8 11.5 - 15.5 %   Platelets 295 150 - 400 K/uL   nRBC 0.0 0.0 - 0.2 %    Comment: Performed at [facility], [place] [address] [place], [place] [address]   No results found.  Review of Systems  HENT: Negative.      Blood pressure (!) 120/92, pulse 92, temperature 97.6 F (36.4 C), temperature source Oral, resp. rate 18, height 6' (1.829 m), weight (!) 140.6 kg, SpO2 97 %. Physical Exam Constitutional:      Appearance: Normal appearance.  HENT:     Mouth/Throat:     Comments: Supraglottic lesion Cardiovascular:     Rate and  Rhythm: Normal rate.  Pulmonary:     Effort: Pulmonary effort is normal.  Musculoskeletal:     Cervical back: Normal range of motion.  Neurological:     Mental Status: He is alert.      Assessment/Plan Patient admitted for outpatient direct laryngoscopy with biopsy of small supraglottic lesion.  Jerrell Belfast, MD 02/03/2022, 12:35 PM

## 2022-02-03 NOTE — Op Note (Signed)
Operative Note: DIRECT LARYNGOSCOPY/ESOPHAGOSCOPY/BIOPSY  Patient: Jesus [name] record number: [id number]  Date:02/03/2022  Pre-operative Indications: 1.  Right laryngeal mass     2.  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  Assist: None  Complications: None  EBL: None   Brief History: The patient is a 66 y.o. 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  Patient asymptomatic but had an abnormal finding on routine follow-up laryngoscopy. Given the patient's history and findings I recommended direct laryngoscopy and biopsy under general anesthesia, risks and benefits were discussed in detail with the patient  They understand and agree with our plan for surgery which is scheduled at [facility] on an elective basis.  Surgical Procedure: The patient is brought to the operating room on 02/03/2022 and placed in supine position on the operating table. General endotracheal anesthesia was established without difficulty. When the patient was adequately anesthetized, surgical timeout was performed and correct identification of the patient and the surgical procedure. The patient was positioned and prepped and draped in sterile fashion.  A laryngoscope was used to examine the patient's oral cavity, oropharynx and larynx.  Findings include normal-appearing vocal cords, the patient had a small raised lesion along the left anterior false vocal cord without ulcer or bleeding.  Using small cup forceps the entire lesion was biopsied.  There was no significant bleeding and the patient's airway was stable.  An orogastric tube was passed and stomach contents were aspirated. Patient was awakened from anesthetic and transferred from the operating room to the  recovery room in stable condition. There were no complications and blood loss was minimal.   Delsa Bern, M.D. [facility] 02/03/2022

## 2022-02-03 NOTE — Transfer of Care (Signed)
Immediate Anesthesia Transfer of Care Note  Patient: Jesus [name]  Procedure(s) Performed: DIRECT LARYNGOSCOPY WITH BIOPSY OF RIGHT FALSE VOCAL CORD (Right: Mouth)  Patient Location: PACU  Anesthesia Type:General  Level of Consciousness: awake and alert   Airway & Oxygen Therapy: Patient Spontanous Breathing and Patient connected to face mask oxygen  Post-op Assessment: Report given to RN and Post -op Vital signs reviewed and stable  Post vital signs: Reviewed and stable  Last Vitals:  Vitals Value Taken Time  BP 141/84 02/03/22 1354  Temp    Pulse 89 02/03/22 1355  Resp 14 02/03/22 1355  SpO2 96 % 02/03/22 1355  Vitals shown include unvalidated device data.  Last Pain:  Vitals:   02/03/22 1056  TempSrc:   PainSc: 0-No pain         Complications: No notable events documented.

## 2022-02-03 NOTE — Anesthesia Postprocedure Evaluation (Signed)
Anesthesia Post Note  Patient: Jesus [name]  Procedure(s) Performed: DIRECT LARYNGOSCOPY WITH BIOPSY OF RIGHT FALSE VOCAL CORD (Right: Mouth)     Patient location during evaluation: PACU Anesthesia Type: General Level of consciousness: awake and alert Pain management: pain level controlled Vital Signs Assessment: post-procedure vital signs reviewed and stable Respiratory status: spontaneous breathing, nonlabored ventilation and respiratory function stable Cardiovascular status: blood pressure returned to baseline and stable Postop Assessment: no apparent nausea or vomiting Anesthetic complications: no   No notable events documented.  Last Vitals:  Vitals:   02/03/22 1400 02/03/22 1415  BP: 133/81 122/75  Pulse: 92 83  Resp: 14 18  Temp:  36.5 C  SpO2: 91% 93%    Last Pain:  Vitals:   02/03/22 1415  TempSrc:   PainSc: 0-No pain                 Ihor Meinzer,W. EDMOND

## 2022-02-03 NOTE — Anesthesia Procedure Notes (Signed)
Procedure Name: Intubation Date/Time: 02/03/2022 1:25 PM  Performed by: Eulas Post, Dannie Woolen W, CRNAPre-anesthesia Checklist: Patient identified, Emergency Drugs available, Suction available and Patient being monitored Patient Re-evaluated:Patient Re-evaluated prior to induction Oxygen Delivery Method: Circle system utilized Preoxygenation: Pre-oxygenation with 100% oxygen Induction Type: IV induction Ventilation: Mask ventilation without difficulty Laryngoscope Size: [name] and 2 Grade View: Grade I Tube type: Oral Tube size: 7.0 mm Number of attempts: 1 Airway Equipment and Method: Stylet Placement Confirmation: ETT inserted through vocal cords under direct vision, positive ETCO2 and breath sounds checked- equal and bilateral Secured at: 25 cm Tube secured with: Tape Dental Injury: Teeth and Oropharynx as per pre-operative assessment

## 2022-02-04 ENCOUNTER — Encounter (HOSPITAL_COMMUNITY): Payer: Self-pay | Admitting: Otolaryngology

## 2022-02-07 LAB — SURGICAL PATHOLOGY

## 2022-03-11 DIAGNOSIS — R109 Unspecified abdominal pain: Secondary | ICD-10-CM | POA: Diagnosis not present

## 2022-03-11 DIAGNOSIS — R059 Cough, unspecified: Secondary | ICD-10-CM | POA: Diagnosis not present

## 2022-03-11 DIAGNOSIS — J40 Bronchitis, not specified as acute or chronic: Secondary | ICD-10-CM | POA: Diagnosis not present

## 2022-03-15 DIAGNOSIS — Z923 Personal history of irradiation: Secondary | ICD-10-CM | POA: Diagnosis not present

## 2022-03-15 DIAGNOSIS — C320 Malignant neoplasm of glottis: Secondary | ICD-10-CM | POA: Diagnosis not present

## 2022-03-17 ENCOUNTER — Telehealth: Payer: Self-pay

## 2022-03-17 ENCOUNTER — Telehealth: Payer: Self-pay | Admitting: Hematology

## 2022-03-17 NOTE — Telephone Encounter (Signed)
Contacted pt regarding MyChart message. Discussed CT scan from 9/23 and will make sure pt has next appointment to see Dr Irene Limbo. Pt wants to discuss outcome of CT scan/mention of Padgett's Disease.

## 2022-03-17 NOTE — Telephone Encounter (Signed)
Called patient per 11/8 in basket. Patient notified of new appointment.

## 2022-04-14 DIAGNOSIS — Z23 Encounter for immunization: Secondary | ICD-10-CM | POA: Diagnosis not present

## 2022-04-14 DIAGNOSIS — R599 Enlarged lymph nodes, unspecified: Secondary | ICD-10-CM | POA: Diagnosis not present

## 2022-04-15 ENCOUNTER — Other Ambulatory Visit: Payer: Self-pay | Admitting: Physician Assistant

## 2022-04-15 DIAGNOSIS — N888 Other specified noninflammatory disorders of cervix uteri: Secondary | ICD-10-CM

## 2022-04-21 ENCOUNTER — Ambulatory Visit
Admission: RE | Admit: 2022-04-21 | Discharge: 2022-04-21 | Disposition: A | Discharge status: Home / Self Care | Payer: No Typology Code available for payment source | Source: Ambulatory Visit | Attending: Physician Assistant | Admitting: Physician Assistant

## 2022-04-21 DIAGNOSIS — N888 Other specified noninflammatory disorders of cervix uteri: Secondary | ICD-10-CM

## 2022-04-21 DIAGNOSIS — R221 Localized swelling, mass and lump, neck: Secondary | ICD-10-CM | POA: Diagnosis not present

## 2022-04-25 ENCOUNTER — Other Ambulatory Visit: Payer: Self-pay | Admitting: Physician Assistant

## 2022-04-25 ENCOUNTER — Encounter: Payer: Self-pay | Admitting: Hematology

## 2022-04-25 ENCOUNTER — Encounter: Payer: Self-pay | Admitting: Physician Assistant

## 2022-04-25 DIAGNOSIS — R590 Localized enlarged lymph nodes: Secondary | ICD-10-CM

## 2022-04-25 DIAGNOSIS — Z8572 Personal history of non-Hodgkin lymphomas: Secondary | ICD-10-CM

## 2022-05-04 ENCOUNTER — Other Ambulatory Visit: Payer: Self-pay | Admitting: Physician Assistant

## 2022-05-04 DIAGNOSIS — R590 Localized enlarged lymph nodes: Secondary | ICD-10-CM

## 2022-05-04 DIAGNOSIS — Z8572 Personal history of non-Hodgkin lymphomas: Secondary | ICD-10-CM

## 2022-05-05 ENCOUNTER — Ambulatory Visit
Admission: RE | Admit: 2022-05-05 | Discharge: 2022-05-05 | Disposition: A | Discharge status: Home / Self Care | Payer: No Typology Code available for payment source | Source: Ambulatory Visit | Attending: Physician Assistant | Admitting: Physician Assistant

## 2022-05-05 DIAGNOSIS — R918 Other nonspecific abnormal finding of lung field: Secondary | ICD-10-CM | POA: Diagnosis not present

## 2022-05-05 DIAGNOSIS — C760 Malignant neoplasm of head, face and neck: Secondary | ICD-10-CM | POA: Diagnosis not present

## 2022-05-05 DIAGNOSIS — Z8572 Personal history of non-Hodgkin lymphomas: Secondary | ICD-10-CM

## 2022-05-05 DIAGNOSIS — G9520 Unspecified cord compression: Secondary | ICD-10-CM | POA: Diagnosis not present

## 2022-05-05 DIAGNOSIS — R590 Localized enlarged lymph nodes: Secondary | ICD-10-CM

## 2022-05-05 DIAGNOSIS — J432 Centrilobular emphysema: Secondary | ICD-10-CM | POA: Diagnosis not present

## 2022-05-05 DIAGNOSIS — H0520 Unspecified exophthalmos: Secondary | ICD-10-CM | POA: Diagnosis not present

## 2022-05-05 DIAGNOSIS — G319 Degenerative disease of nervous system, unspecified: Secondary | ICD-10-CM | POA: Diagnosis not present

## 2022-05-05 MED ORDER — IOPAMIDOL (ISOVUE-300) INJECTION 61%
100.0000 mL | Freq: Once | INTRAVENOUS | Status: AC | PRN
  Administered 2022-05-05: 100 mL via INTRAVENOUS

## 2022-05-16 ENCOUNTER — Other Ambulatory Visit: Payer: Self-pay | Admitting: Physician Assistant

## 2022-05-16 DIAGNOSIS — G939 Disorder of brain, unspecified: Secondary | ICD-10-CM

## 2022-05-16 DIAGNOSIS — J209 Acute bronchitis, unspecified: Secondary | ICD-10-CM | POA: Diagnosis not present

## 2022-05-17 ENCOUNTER — Other Ambulatory Visit: Payer: Self-pay

## 2022-05-17 DIAGNOSIS — C8330 Diffuse large B-cell lymphoma, unspecified site: Secondary | ICD-10-CM

## 2022-05-18 ENCOUNTER — Inpatient Hospital Stay: Payer: Medicare HMO | Attending: Hematology

## 2022-05-18 ENCOUNTER — Inpatient Hospital Stay (HOSPITAL_BASED_OUTPATIENT_CLINIC_OR_DEPARTMENT_OTHER): Payer: Medicare HMO | Admitting: Hematology

## 2022-05-18 ENCOUNTER — Other Ambulatory Visit: Payer: Self-pay

## 2022-05-18 VITALS — BP 129/70 | HR 96 | Temp 97.9°F | Resp 20 | Wt 320.9 lb

## 2022-05-18 DIAGNOSIS — Z8572 Personal history of non-Hodgkin lymphomas: Secondary | ICD-10-CM | POA: Insufficient documentation

## 2022-05-18 DIAGNOSIS — Z8521 Personal history of malignant neoplasm of larynx: Secondary | ICD-10-CM | POA: Diagnosis not present

## 2022-05-18 DIAGNOSIS — Z79899 Other long term (current) drug therapy: Secondary | ICD-10-CM | POA: Diagnosis not present

## 2022-05-18 DIAGNOSIS — R590 Localized enlarged lymph nodes: Secondary | ICD-10-CM | POA: Diagnosis not present

## 2022-05-18 DIAGNOSIS — Z86718 Personal history of other venous thrombosis and embolism: Secondary | ICD-10-CM | POA: Insufficient documentation

## 2022-05-18 DIAGNOSIS — C8330 Diffuse large B-cell lymphoma, unspecified site: Secondary | ICD-10-CM | POA: Diagnosis not present

## 2022-05-18 DIAGNOSIS — Z87891 Personal history of nicotine dependence: Secondary | ICD-10-CM | POA: Insufficient documentation

## 2022-05-18 LAB — CBC WITH DIFFERENTIAL (CANCER CENTER ONLY)
Abs Immature Granulocytes: 0.01 10*3/uL (ref 0.00–0.07)
Basophils Absolute: 0 10*3/uL (ref 0.0–0.1)
Basophils Relative: 0 %
Eosinophils Absolute: 0.1 10*3/uL (ref 0.0–0.5)
Eosinophils Relative: 1 %
HCT: 37 % — ABNORMAL LOW (ref 39.0–52.0)
Hemoglobin: 12.5 g/dL — ABNORMAL LOW (ref 13.0–17.0)
Immature Granulocytes: 0 %
Lymphocytes Relative: 52 %
Lymphs Abs: 2.8 10*3/uL (ref 0.7–4.0)
MCH: 29.5 pg (ref 26.0–34.0)
MCHC: 33.8 g/dL (ref 30.0–36.0)
MCV: 87.3 fL (ref 80.0–100.0)
Monocytes Absolute: 0.6 10*3/uL (ref 0.1–1.0)
Monocytes Relative: 11 %
Neutro Abs: 2 10*3/uL (ref 1.7–7.7)
Neutrophils Relative %: 36 %
Platelet Count: 296 10*3/uL (ref 150–400)
RBC: 4.24 MIL/uL (ref 4.22–5.81)
RDW: 13.4 % (ref 11.5–15.5)
WBC Count: 5.5 10*3/uL (ref 4.0–10.5)
nRBC: 0 % (ref 0.0–0.2)

## 2022-05-18 LAB — CMP (CANCER CENTER ONLY)
ALT: 8 U/L (ref 0–44)
AST: 14 U/L — ABNORMAL LOW (ref 15–41)
Albumin: 4.4 g/dL (ref 3.5–5.0)
Alkaline Phosphatase: 59 U/L (ref 38–126)
Anion gap: 7 (ref 5–15)
BUN: 16 mg/dL (ref 8–23)
CO2: 28 mmol/L (ref 22–32)
Calcium: 9.2 mg/dL (ref 8.9–10.3)
Chloride: 101 mmol/L (ref 98–111)
Creatinine: 1.24 mg/dL (ref 0.61–1.24)
GFR, Estimated: >60 mL/min (ref >60)
Glucose, Bld: 182 mg/dL — ABNORMAL HIGH (ref 70–99)
Potassium: 3.8 mmol/L (ref 3.5–5.1)
Sodium: 136 mmol/L (ref 135–145)
Total Bilirubin: 0.4 mg/dL (ref 0.3–1.2)
Total Protein: 7.7 g/dL (ref 6.5–8.1)

## 2022-05-18 LAB — TSH: TSH: 2.051 u[IU]/mL (ref 0.350–4.500)

## 2022-05-18 LAB — LACTATE DEHYDROGENASE: LDH: 168 U/L (ref 98–192)

## 2022-05-18 NOTE — Progress Notes (Signed)
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 male is here because of left lower extremity edema and lymphadenopathy.  The patient was seen in the emergency room this past Friday for the same issue.  A CT of the abdomen and pelvis was performed showing bulky left inguinal, left hemipelvic, and retroperitoneal adenopathy.  He was referred to [place] from the emergency room for further evaluation.  Doppler ultrasound of the left lower extremity was performed and was negative for DVT. Patient reports that he has been having left lower extremity edema in his left groin and left leg for approximately 1 month.  He states that the swelling in the left groin started to get better but then worsened.  The left lower extremity edema has slowly worsened over time.  Patient denies having fevers and chills.  He reports that he does have night sweats at times.  He reported [place] approximate 1 [place]  He thinks his headaches are related to not having his blood pressure medication.  His headaches have now resolved.  He denies visual changes.  The patient denies chest pain, shortness of breath and cough.  No nausea, vomiting, constipation, diarrhea.  Denies abdominal pain.  The patient denies recent weight loss and has actually  gained weight recently.  Patient denies epistaxis, bleeding gums, hemoptysis, hematuria, but occasionally, and melena.  He reports increased urinary frequency over the past month but no dysuria.  The patient is here for evaluation and discussion of his recent CT and lab findings.  Interval History:   Jesus [name] is here for continued evaluation and management of his history of T-cell rich B-cell lymphoma and vocal cord squamous Cell carcinoma.  Patient was last seen by me on 12/29/2021 and was doing well overall.   Patient reports he has been doing fairly well since our last visit. He reports he had persistent cough and congestion on 05/08/2022. Patient went to the hospital and was diagnosed with Bronchitis and he was prescribed antibiotics, but patient is unsure of the antibiotic name. He notes his respiratory symptoms are better with the antibiotics.  Patient also reports of enlarged nodule in his right neck, which he got CT scans in December 2023. We discussed the CT scan results in detail. Patient also reports that his ENT physician show a mild growth in the inside of the neck, which was another reason for the CT scan. He notes his ENT Physician retired.   Patient denies swallowing problem, speech problems, fever, chills, night sweats, chest pain, back pain, neck pain, headaches, nerve problem, urination problem, abnormal bowel movement, or leg swelling. He does complain of bilateral leg pain and tingling sensation in hand and leg (unchanged since last visit).  He has an upcoming MRI scan.   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- last radiation 06-2018   Cataract    removed both eyes with l;ens implants    Family history of colon cancer    in his brother- dx'd age 62     History of chemotherapy    last 06-01-2018   History of kidney stones    History of radiation therapy    last radiation 06-2018   Hyperlipidemia    currently under control   Hypertension    Irregular heart beats    Lymphadenopathy    Pain, lower leg    Bilateral   Peripheral arterial disease (HCC)    Pre-diabetes    Red-green color blindness    RLS (restless legs syndrome)    Snores    Wears glasses     SURGICAL HISTORY: Past Surgical History:  Procedure Laterality Date   CATARACT EXTRACTION W/ INTRAOCULAR LENS  IMPLANT, BILATERAL     COLONOSCOPY     DIRECT LARYNGOSCOPY Right 02/03/2022   Procedure: DIRECT LARYNGOSCOPY WITH BIOPSY OF RIGHT FALSE VOCAL CORD;  Surgeon: Jerrell Belfast, MD;  Location: [facility] OR;  Service: ENT;  Laterality: Right;   dislodged salava stone     FRACTURE SURGERY     HAND ARTHROPLASTY  1995   crushed left hand   INGUINAL LYMPH NODE BIOPSY Left 01/02/2018   Procedure: LEFT INGUINAL LYMPH NODE BIOPSY;  Surgeon: Rolm Bookbinder, MD;  Location: [facility];  Service: General;  Laterality: Left;   IR IMAGING GUIDED PORT INSERTION  01/15/2018   IR REMOVAL TUN ACCESS W/ PORT W/O FL MOD SED  03/11/2019   MICROLARYNGOSCOPY Left 01/17/2014   Procedure: MICROLARYNGOSCOPY WITH EXCISION OF THE BIOPSY OF LEFT VOCAL CORD LESION;  Surgeon: Izora Gala, MD;  Location: [facility];  Service: ENT;  Laterality: Left;   MICROLARYNGOSCOPY N/A 09/16/2020   Procedure: MICROLARYNGOSCOPY with Biopsy of vocal cord lesion;  Surgeon: Jerrell Belfast, MD;  Location: [facility] OR;  Service: ENT;  Laterality: N/A;   ORIF FOOT FRACTURE  2005   left   REFRACTIVE SURGERY Right    removed cloudiness in right eye after cataract removal     SOCIAL HISTORY: Social History   Socioeconomic History   Marital status: Divorced    Spouse name: Not on file   Number of children: 3   Years of education: Not on file   Highest education level: Not on file  Occupational History   Not on  file  Tobacco Use   Smoking status: Former    Packs/day: 0.50    Years: 36.00    Total pack years: 18.00    Types: Cigarettes    Quit date: 09/28/2020    Years since quitting: 1.6   Smokeless tobacco: Never   Tobacco comments:    he denies smoking in about 2 weeks 07/13/18  Vaping Use   Vaping Use: Never used  Substance and Sexual Activity   Alcohol use: Yes    Alcohol/week: 6.0 standard drinks of alcohol    Types: 6 Cans of beer per week    Comment: weekends   Drug use: Yes    Types: Marijuana    Comment: reports cocaine usage ~2X/ month; last use 12/26/17   Sexual activity: Not on file  Other Topics Concern   Not on  file  Social History Narrative   Not on file   Social Determinants of Health   Financial Resource Strain: Low Risk  (10/06/2020)   Overall Financial Resource Strain (CARDIA)    Difficulty of Paying Living Expenses: Not very hard  Food Insecurity: No Food Insecurity (10/06/2020)   Hunger Vital Sign    Worried About Running Out of Food in the Last Year: Never true    Ran Out of Food in the Last Year: Never true  Transportation Needs: No Transportation Needs (10/06/2020)   PRAPARE - Hydrologist (Medical): No    Lack of Transportation (Non-Medical): No  Physical Activity: Not on file  Stress: Not on file  Social Connections: Moderately Integrated (10/06/2020)   Social Connection and Isolation Panel [NHANES]    Frequency of Communication with Friends and Family: Three times a week    Frequency of Social Gatherings with Friends and Family: Three times a week    Attends Religious Services: 1 to 4 times per year    Active Member of Clubs or Organizations: Yes    Attends Archivist Meetings: 1 to 4 times per year    Marital Status: Divorced  Intimate Partner Violence: Not At Risk (07/13/2018)   Humiliation, Afraid, Rape, and Kick questionnaire    Fear of Current or Ex-Partner: No    Emotionally Abused: No    Physically Abused:  No    Sexually Abused: No    FAMILY HISTORY: Family History  Problem Relation Age of Onset   Breast cancer Mother    Diabetes Father    Hypertension Father    Stroke Father    Mental illness Sister    Hypertension Daughter    Mental illness Daughter    Hypertension Brother    Colon cancer Brother 3       passed away 11/20/18   Breast cancer Sister    Esophageal cancer Neg Hx    Colon polyps Neg Hx    Rectal cancer Neg Hx    Stomach cancer Neg Hx     ALLERGIES:  is allergic to bee venom, antifungal [miconazole nitrate], and zolpidem tartrate er.  MEDICATIONS:  Current Outpatient Medications  Medication Sig Dispense Refill   amLODipine (NORVASC) 10 MG tablet TAKE 1 TABLET BY MOUTH EVERY DAY (Patient taking differently: Take 10 mg by mouth every evening.) 30 tablet 0   aspirin EC 81 MG tablet Take 81 mg by mouth every evening. Swallow whole.     b complex vitamins capsule Take 1 capsule by mouth daily.     lisinopril (ZESTRIL) 20 MG tablet TAKE 1 TABLET BY MOUTH EVERY DAY (Patient taking differently: Take 20 mg by mouth every evening.) 30 tablet 0   metFORMIN (GLUCOPHAGE) 500 MG tablet Take 500 mg by mouth daily.     Multiple Vitamins-Minerals (MULTI ADULT GUMMIES PO) Take 1 tablet by mouth in the morning. Centrum     OVER THE COUNTER MEDICATION Take 1 tablet by mouth daily. Immune plus     tadalafil (CIALIS) 20 MG tablet Take 20 mg by mouth daily as needed for erectile dysfunction.     No current facility-administered medications for this visit.    REVIEW OF SYSTEMS:   10 Point review of Systems was done is negative except as noted above. PHYSICAL EXAMINATION:  Vitals:   05/18/22 1330  BP: 129/70  Pulse: 96  Resp: 20  Temp: 97.9 F (36.6 C)  SpO2: 96%  Filed Weights   05/18/22 1330  Weight: (!) 320 lb 14.4 oz (145.6 kg)   . NAD GENERAL:alert, in no acute distress and comfortable SKIN: no acute rashes, no significant lesions EYES: conjunctiva are pink  and non-injected, sclera anicteric OROPHARYNX: MMM, no exudates, no oropharyngeal erythema or ulceration NECK: supple, no JVD LYMPH:  rt cervical nodule, no palpable lymphadenopathy in the axillary or inguinal regions LUNGS: clear to auscultation b/l with normal respiratory effort HEART: regular rate & rhythm ABDOMEN:  normoactive bowel sounds , non tender, not distended. Extremity: no pedal edema PSYCH: alert & oriented x 3 with fluent speech NEURO: no focal motor/sensory deficits Please stop  LABORATORY DATA:   I have reviewed the data as listed    Latest Ref Rng & Units 05/18/2022   12:49 PM 02/03/2022   11:14 AM 12/29/2021    8:47 AM  CBC  WBC 4.0 - 10.5 K/uL 5.5  6.0  4.9   Hemoglobin 13.0 - 17.0 g/dL 12.5  12.1  11.6   Hematocrit 39.0 - 52.0 % 37.0  37.3  34.3   Platelets 150 - 400 K/uL 296  295  284    CBC    Component Value Date/Time   WBC 5.5 05/18/2022 1249   WBC 6.0 02/03/2022 1114   RBC 4.24 05/18/2022 1249   HGB 12.5 (L) 05/18/2022 1249   HCT 37.0 (L) 05/18/2022 1249   PLT 296 05/18/2022 1249   MCV 87.3 05/18/2022 1249   MCV 87.3 12/22/2017 1518   MCH 29.5 05/18/2022 1249   MCHC 33.8 05/18/2022 1249   RDW 13.4 05/18/2022 1249   LYMPHSABS 2.8 05/18/2022 1249   MONOABS 0.6 05/18/2022 1249   EOSABS 0.1 05/18/2022 1249   BASOSABS 0.0 05/18/2022 1249       Latest Ref Rng & Units 05/18/2022   12:49 PM 02/03/2022   11:14 AM 12/29/2021    8:47 AM  CMP  Glucose 70 - 99 mg/dL 182  190  154   BUN 8 - 23 mg/dL '16  15  15   '$ Creatinine 0.61 - 1.24 mg/dL 1.24  1.12  0.91   Sodium 135 - 145 mmol/L 136  137  138   Potassium 3.5 - 5.1 mmol/L 3.8  4.3  4.2   Chloride 98 - 111 mmol/L 101  103  104   CO2 22 - 32 mmol/L '28  22  26   '$ Calcium 8.9 - 10.3 mg/dL 9.2  9.5  9.4   Total Protein 6.5 - 8.1 g/dL 7.7   7.1   Total Bilirubin 0.3 - 1.2 mg/dL 0.4   0.7   Alkaline Phos 38 - 126 U/L 59   57   AST 15 - 41 U/L 14   17   ALT 0 - 44 U/L 8   15    . Lab Results   Component Value Date   LDH 168 05/18/2022   Tsh 1.42 (05/21/2021)   01/02/18 Left Inguinal LN Bx:   12/26/17 Tissue Flow Cytometry:   12/26/17 Inguinal Core biopsy:    RADIOGRAPHIC STUDIES: I have personally reviewed the radiological images as listed and agreed with the findings in the report. CT SOFT TISSUE NECK W CONTRAST  Result Date: 05/07/2022 CLINICAL DATA:  History of large B-cell lymphoma, head and neck cancer, right cervical lymph node mass resection. EXAM: CT NECK WITH CONTRAST TECHNIQUE: Multidetector CT imaging of the neck was performed using the standard protocol following the bolus administration of intravenous contrast. RADIATION DOSE REDUCTION:  This exam was performed according to the departmental dose-optimization program which includes automated exposure control, adjustment of the mA and/or kV according to patient size and/or use of iterative reconstruction technique. CONTRAST:  182m ISOVUE-300 IOPAMIDOL (ISOVUE-300) INJECTION 61% COMPARISON:  Similar study of 08/27/2020 FINDINGS: Technical note: There is excessive beam hardening and streak artifact below the level of C3 due to superimposition of the patient's shoulders. This limits fine detail. Pharynx and larynx: There is interval increased symmetric fullness in the adenoidal eminence, the lingual tonsils and palatine tonsils. This could be due to pharyngitis or lymphoproliferative etiology. Encroachment on the posterior nasopharynx is seen due to the prominent adenoids, with narrowing of the pharyngeal airway due to the prominent palatine tonsils. There is no underlying abscess. The epiglottis is normal. The vallecula remain partially aerated. Piriform sinuses are effaced. There are small laryngoceles which were noted previously. There previously was a mild thickening of the right vocal fold. Due to apposition, the vocal folds are poorly visualized on today's but do not show an obvious mass, adjacent cartilage erosion or  paraglottic fat infiltration. Please note that the larynx is not as well seen as previously due to the streak artifacts described above. Salivary glands: No inflammation, mass, or stone. Thyroid: No mass or enlargement. Lymph nodes: There is interval development of an enlarged heterogeneous and apparently centrally necrotic right posterior cervical chain node, level of approximately C3-4 measuring 2.3 x 2 cm on 11:40. Just lateral to this there is a lymph node of 7 mm in short axis. Additional scattered shotty subcentimeter bilateral posterior cervical chain nodes are unaltered as well as can be seen. There are stable bilateral jugular chain nodes up to 8 mm in short axis. Stable subcentimeter submental and submandibular nodes. There is no supraclavicular adenopathy visible through the streak artifact. Vascular: No significant findings. Limited intracranial: Negative. Visualized orbits: Not imaged. Mastoids and visualized paranasal sinuses: Small retention cyst or polyp posterior right maxillary sinus. Trace fluid right mastoid tip. Otherwise clear. Skeleton: Degenerative changes cervical spine, short cervical pedicles reducing the effective AP spinal canal diameter. There is uncinate hypertrophy and foraminal sclerosis at C6-7 and C7-T1 with degenerative discs. There is spondylosis with posterior disc osteophyte complexes mildly compressing the cord at C5-6 and C6-7. No acute or aggressive bone lesion is seen. Upper chest: See separate chest CT report contemporaneously done. Other: None. IMPRESSION: 1. Interval development of a 2.3 x 2 cm heterogeneous and apparently centrally necrotic right posterior cervical chain node at approximately the C3-4 level. 2. Interval increased symmetric fullness in the adenoidal eminence, lingual tonsils and palatine tonsils. This could be due to pharyngitis or lymphoproliferative etiology. 3. Encroachment on the posterior nasopharynx due to prominent adenoids, with narrowing of the  pharyngeal airway due to the prominent palatine tonsils. 4. Previously there was mild thickening of the right vocal fold. Due to apposition and streak artifact through the larynx, the vocal folds are poorly visualized on today's but do not show an obvious mass, adjacent cartilage erosion or paraglottic fat infiltration. 5. Degenerative changes of the cervical spine with short pedicles reducing the effective AP spinal canal diameter. 6. Posterior disc osteophyte complexes mildly compressing the cord at C5-6 and C6-7. Electronically Signed   By: KTelford NabM.D.   On: 05/07/2022 01:12   CT HEAD W & WO CONTRAST (5MM)  Result Date: 05/07/2022 CLINICAL DATA:  History of B-cell lymphoma with right-sided cervical lymph node mass resection. Head and neck cancer. EXAM: CT HEAD WITHOUT AND WITH  CONTRAST TECHNIQUE: Contiguous axial images were obtained from the base of the skull through the vertex without and with intravenous contrast. RADIATION DOSE REDUCTION: This exam was performed according to the departmental dose-optimization program which includes automated exposure control, adjustment of the mA and/or kV according to patient size and/or use of iterative reconstruction technique. CONTRAST:  131m ISOVUE-300 IOPAMIDOL (ISOVUE-300) INJECTION 61% COMPARISON:  Head CT without contrast 07/08/2021. FINDINGS: Brain: No evidence of acute infarction, hemorrhage, hydrocephalus, extra-axial collection or mass lesion/mass effect. There is mild cerebral atrophy and small-vessel disease. Ventricles are normal in size and position. There are no old territorial infarcts. There is however, a new 7 mm hypodensity in the anterior aspect of the right thalamus compatible with either a late subacute or chronic nonhemorrhagic lacunar infarct having occurred since 07/08/2021. This does not show postcontrast enhancement. No enhancing lesions seen elsewhere in the posterior fossa and supratentorial space either. Vascular: No hyperdense  vessel or unexpected calcification. Visible vessels are patent. Skull:   Negative for fracture or focal lesion. Sinuses/Orbits: Small retention cyst again noted posterior right maxillary sinus. Other visible sinuses, bilateral mastoid air cells, and middle ears are clear apart from trace chronic fluid in the right mastoid tip. There is pneumatization of both petrous apices. 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 There is no extraocular muscle thickening. Other: Mild left deviation and spurring of the nasal septum. IMPRESSION: 1. No acute intracranial CT findings or enhancing lesions. 2. New 7 mm hypodensity in the anterior right thalamus compatible with either a late-subacute or chronic nonhemorrhagic lacunar infarct having occurred since 07/08/2021. Consider follow-up MRI without and with contrast if clinically warranted. 3. Mild cerebral atrophy and small-vessel disease. 4. Bilateral proptosis with bilateral uveoscleral staphylomas. The proptosis could be related to obesity, chronic corticosteroid use or thyroid disease. Electronically Signed   By: KTelford NabM.D.   On: 05/07/2022 00:40   CT CHEST W CONTRAST  Result Date: 05/07/2022 CLINICAL DATA:  History of large B-cell lymphoma and head and neck cancer. Right cervical lymph node mass resection EXAM: CT CHEST WITH CONTRAST TECHNIQUE: Multidetector CT imaging of the chest was performed during intravenous contrast administration. RADIATION DOSE REDUCTION: This exam was performed according to the departmental dose-optimization program which includes automated exposure control, adjustment of the mA and/or kV according to patient size and/or use of iterative reconstruction technique. CONTRAST:  1069mISOVUE-300 IOPAMIDOL (ISOVUE-300) INJECTION 61% COMPARISON:  Chest, abdomen and pelvis CT with IV contrast 01/31/2022, CT without contrast 10/06/2020  FINDINGS: Cardiovascular: There is mild cardiomegaly with a left chamber predominance. No pericardial effusion. Trace calcification LAD coronary artery. Pulmonary arteries and veins are normal caliber with clear central pulmonary arteries. Negligible aortic atherosclerosis noted with otherwise normal aorta and great vessels. Mediastinum/Nodes: There is interval enlargement of a left paratracheal nodal mass. This previously measured 1.6 x 1.2 cm AP and transverse and today measures 2.8 x 1.9 cm on 6:18. There is a stable 9 mm in short axis right paratracheal space node. No interval change in small subcentimeter in short axis bilateral axillary nodes is seen. Subcarinal lymph node to the right again measures 1.1 cm in short axis. There is no hilar or further mediastinal adenopathy. The thoracic trachea and thoracic esophagus are unremarkable. No thyroid mass is seen. Lungs/Pleura: No pleural effusion, thickening or pneumothorax. Mild centrilobular emphysema. Chronic linear scar-like opacity in the lingula. Chronic paraspinal scarring right lower lobe. The lungs  are otherwise clear. Upper Abdomen: There is hepatic steatosis. Bilateral renal cysts are again partially visible. No acute upper abdominal findings or mass. Musculoskeletal: There is thoracic spondylosis. No primary pathologic bone process is seen. There is no chest wall mass. IMPRESSION: 1. Interval enlargement of a left paratracheal nodal mass now measuring 2.8 x 1.9 cm, previously 1.6 x 1.2 cm. Concern for metastasis or lymphoma recurrence. Other minimally prominent mediastinal nodes are unchanged. 2. No other interval change. 3. Emphysema. 4. Cardiomegaly. 5. Aortic and coronary artery atherosclerosis. 6. Hepatic steatosis. Aortic Atherosclerosis (ICD10-I70.0) and Emphysema (ICD10-J43.9). Electronically Signed   By: Telford Nab M.D.   On: 05/07/2022 00:28   US SOFT TISSUE HEAD & NECK (NON-THYROID)  Result Date: 04/21/2022 CLINICAL DATA:   Right-sided neck mass. History of B-cell lymphoma and head neck cancer. EXAM: ULTRASOUND OF HEAD/NECK SOFT TISSUES TECHNIQUE: Ultrasound examination of the head and neck soft tissues was performed in the area of clinical concern. COMPARISON:  Chest CT-01/31/2022; neck CT-08/27/2020 FINDINGS: Sonographic evaluation of patient's palpable area of concern involving the right-side of the neck correlates with a enlarged hypoechoic malignant appearing presumed lymph node which measures approximately 3.3 x 2.2 x 1.7 cm (representative images 2 through 5). No additional regional cervical lymphadenopathy identified. IMPRESSION: Sonographic evaluation of patient's palpable area of concern involving the right-side of the neck correlates with an enlarged, malignant appearing presumed lymph node. Given patient's history of both head and neck cancer as well as B-cell lymphoma, potential imaging strategies could entail acquisition of contrast-enhanced neck CT and/or PET-CT as indicated. Following the acquisition of additional cross-sectional imaging, ultimately this lymph node could undergo ultrasound-guided biopsy as indicated. Electronically Signed   By: Sandi Mariscal M.D.   On: 04/21/2022 16:20    ASSESSMENT & PLAN:   This is a pleasant 67 y.o. 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   CT of the abdomen and pelvis performed on 12/22/2017 showed bulky left inguinal, left hemipelvic, and  retroperitoneal adenopathy.     01/02/18 Left inguinal LN Biopsy revealed T-Cell/histocyte rich Large B-Cell Lymphoma   12/27/17 ECHO revealed LV EF of 55-60%    01/05/18 PET/CT revealed Massively enlarged pelvic lymph nodes intense metabolic activity consistent lymphoma. 2.  Additional hypermetabolic lymph nodes in the porta hepatis and retroperitoneum LEFT aorta. 3. Solitary hypermetabolic mediastinal lymph node in the upper LEFT Mediastinum. 4. 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 5. Normal spleen.     HIV non reactive on 12/22/2017.  Hep C and hep B serology negative.   03/14/18 PET/CT revealed PET-CT findings suggest an excellent response to chemotherapy. The abdominal lymphadenopathy has near completely resolved and demonstrates a near complete metabolic response. The pelvic and inguinal adenopathy has significantly decreased in size and the metabolic activity has significantly decreased. 2. Diffuse marrow activity likely due to chemotherapy and or marrow stimulating drugs. I do not see any discrete persistent lesions.    04/17/18 CT Head revealed Subtle mesial caudothalamic hypodensities may be artifact though, the could reflect encephalitis or Wernicke's encephalopathy. Consider MRI of the head with and without contrast. 2. Mild chronic small vessel ischemic changes    S/p 6 cycles of EPOCH-R completed on 06/01/18  06/28/18 PET/CT revealed Continued good response to treatment. No residual measurable or hypermetabolic abdominal  lymphadenopathy and no recurrent osseous disease. 2. Interval decrease in size of the left operator region lymph node and also the left inguinal lymph node. However, the both have small foci of slightly increased hypermetabolism which bears surveillance. 3. No new or progressive lymphadenopathy in the neck, chest, abdomen or pelvis.  S/p 39.6 Gy in 22 fractions between 07/25/18 and 08/23/18  02/13/2019 CT C/A/P ([id number]) ([id number]) revealed "1. Response to therapy of pelvic adenopathy compared to the PET of 06/28/2018. 2. No new or progressive disease. 3. Mild prostatomegaly. 4. 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 5. Hepatomegaly."   2)  left lower extremity swelling- now resolved  Doppler ultrasound for DVT was negative in the left lower extremity.   Likely from venous compression +/- lymphatic obstruction from bulky left inguinal, left hemipelvic, and  retroperitoneal adenopathy.    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 CBC shows slightly decreased hemoglobin of 12.5 K and slightly decreased hematocrit of 37.0. CMP shows slightly elevated glucose level of 182.  -Discussed the CT scan results form December 2023 with the patient. CT scan showed Interval development of a 2.3 x 2 cm heterogeneous and apparently centrally necrotic right posterior cervical chain node at approximately the C3-4 level. -Plan PET scan before next visit.  -Plan on ultrasound core needle biopsy of his enlarged right neck nodule.   FOLLOW-UP: Ultrasound-guided core needle biopsy of right cervical lymph node ASAP PET CT scan in 1 week Return to clinic with Dr. Irene Limbo in 2 weeks  The total time spent in the appointment was 30 minutes* .  All of the patient's questions were answered with apparent satisfaction. The patient knows to call the clinic with any problems, questions or concerns.   Sullivan Lone MD MS AAHIVMS [facility] Hematology/Oncology Physician [facility]  .*Total Encounter Time as defined by the Centers for Medicare and Medicaid Services includes, in addition to the face-to-face time of a patient visit (documented in the note above) non-face-to-face time: obtaining and reviewing outside history, ordering and reviewing medications, tests or procedures, care coordination (communications with other health care professionals or caregivers) and documentation in the medical  record.   I, Cleda Mccreedy, am acting as a Education administrator for Sullivan Lone, MD.  .I have reviewed the above documentation for accuracy and completeness, and I agree with the above. Brunetta Genera MD

## 2022-05-19 ENCOUNTER — Other Ambulatory Visit: Payer: Self-pay | Admitting: Radiology

## 2022-05-20 NOTE — Progress Notes (Signed)
Corrie Mckusick, DO  [name]; P [facility] Procedure Requests OK for US guided right cervical node/mass biopsy.  Earleen Newport

## 2022-05-20 NOTE — Progress Notes (Signed)
Jesus Mckusick, DO  [name] This can be done safely without sedation if the patient is comfortable with that. JW

## 2022-05-24 ENCOUNTER — Encounter: Payer: Self-pay | Admitting: Hematology

## 2022-05-24 DIAGNOSIS — H26491 Other secondary cataract, right eye: Secondary | ICD-10-CM | POA: Diagnosis not present

## 2022-05-24 DIAGNOSIS — H4423 Degenerative myopia, bilateral: Secondary | ICD-10-CM | POA: Diagnosis not present

## 2022-05-24 DIAGNOSIS — Z961 Presence of intraocular lens: Secondary | ICD-10-CM | POA: Diagnosis not present

## 2022-05-24 DIAGNOSIS — H04123 Dry eye syndrome of bilateral lacrimal glands: Secondary | ICD-10-CM | POA: Diagnosis not present

## 2022-05-24 NOTE — Progress Notes (Signed)
Patient for US guided Core LN Biopsy on Wed 05/25/2022, I called and spoke with the patient on the phone and gave pre-procedure instructions. Pt was made aware to be here at 12:30p at the [facility] registration. Pt stated understanding.  Called 05/24/2022

## 2022-05-25 ENCOUNTER — Ambulatory Visit
Admission: RE | Admit: 2022-05-25 | Discharge: 2022-05-25 | Disposition: A | Discharge status: Home / Self Care | Payer: Medicare HMO | Source: Ambulatory Visit | Attending: Hematology | Admitting: Hematology

## 2022-05-25 DIAGNOSIS — R590 Localized enlarged lymph nodes: Secondary | ICD-10-CM | POA: Diagnosis not present

## 2022-05-25 DIAGNOSIS — C770 Secondary and unspecified malignant neoplasm of lymph nodes of head, face and neck: Secondary | ICD-10-CM | POA: Insufficient documentation

## 2022-05-25 DIAGNOSIS — Z8572 Personal history of non-Hodgkin lymphomas: Secondary | ICD-10-CM | POA: Diagnosis not present

## 2022-05-25 DIAGNOSIS — C8330 Diffuse large B-cell lymphoma, unspecified site: Secondary | ICD-10-CM | POA: Diagnosis present

## 2022-05-25 DIAGNOSIS — C329 Malignant neoplasm of larynx, unspecified: Secondary | ICD-10-CM | POA: Diagnosis not present

## 2022-05-25 MED ORDER — LIDOCAINE HCL (PF) 1 % IJ SOLN
5.0000 mL | Freq: Once | INTRAMUSCULAR | Status: AC
  Administered 2022-05-25: 5 mL via INTRADERMAL

## 2022-05-25 NOTE — Progress Notes (Signed)
Mr. Baca presents for follow-up after completing radiation to his larynx on 11/25/2020   Pain issues, if any: Denies any current pain; did have a sore throat and bilateral ear pain earlier in the month, but reports it resolved this week Using a feeding tube?: N/A Weight changes, if any:  Wt Readings from Last 3 Encounters:  06/01/22 (!) 323 lb 8 oz (146.7 kg)  05/18/22 (!) 320 lb 14.4 oz (145.6 kg)  02/03/22 (!) 310 lb (140.6 kg)   Swallowing issues, if any: Denies any issues (aside from crunchy foods which cause him to cough) Smoking or chewing tobacco? None Using fluoride trays daily? N/A--sees his periodontist regularly and is scheduled for dental work at [facility] visit was on:  Saw Dr. 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 Seen by Dr. Isidore Moos for follow-up laryngoscopy the patient was noted to have a small area of concern involving the right false vocal cord. He underwent laryngoscopy and biopsy on 02/03/2022 and pathology showed postradiation changes, benign findings with mild atypia, no evidence of recurrent carcinoma. Flexible laryngoscopy performed today shows normal vocal cord mobility no evidence of ulcer, mass or lesion. Previous biopsy site shows no erythema, ulcer or swelling. Patient reassured regarding today's findings. Recommend follow-up as scheduled in the cancer center for repeat screening in January, return to our office in April for interval 18-monthevaluations. Patient has a history of non-Hodgkin's lymphoma and is also followed through medical oncology.  Other notable issues, if any:  Had lymph node biopsied last week 05/25/2022  DIAGNOSIS:  A. LYMPH NODE, RIGHT NECK; ULTRASOUND-GUIDED CORE NEEDLE BIOPSY:  - POSITIVE FOR MALIGNANCY.  -  METASTATIC KERATINIZING SQUAMOUS CELL CARCINOMA.  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   Scheduled for MRI Brain w/ & w/o contrast on 06/02/22 and PET scan on 06/08/22  Per Dr. KGrier [place] office note 05/18/2022 PLAN: -Discussed lab results from today, 05/18/2022, with the patient. CBC shows slightly decreased hemoglobin of 12.5 K and slightly decreased hematocrit of 37.0. CMP shows slightly elevated glucose level of 182.  -Discussed the CT scan results form December 2023 with the patient. CT scan showed Interval development of a 2.3 x 2 cm heterogeneous and apparently centrally necrotic right posterior cervical chain node at approximately the C3-4 level. -Plan PET scan before next visit.  -Plan on ultrasound core needle biopsy of his enlarged right neck nodule.   FOLLOW-UP: - Ultrasound-guided core needle biopsy of right cervical lymph node ASAP - PET CT scan in 1 week - Return to clinic with Dr. KIrene Limboin 2 weeks

## 2022-05-25 NOTE — Procedures (Addendum)
Interventional Radiology Procedure Note  Procedure: [place] RT NECK ADENOPATHY CORE BX    Complications: None  Estimated Blood Loss:  MIN  Findings: 18G CORES ON SALINE TELFA  Tamera Punt, MD

## 2022-05-26 ENCOUNTER — Ambulatory Visit (HOSPITAL_COMMUNITY): Payer: Medicare HMO

## 2022-06-01 ENCOUNTER — Other Ambulatory Visit: Payer: Self-pay | Admitting: Physician Assistant

## 2022-06-01 ENCOUNTER — Ambulatory Visit
Admission: RE | Admit: 2022-06-01 | Discharge: 2022-06-01 | Disposition: A | Discharge status: Home / Self Care | Payer: Medicare HMO | Source: Ambulatory Visit | Attending: Radiation Oncology | Admitting: Radiation Oncology

## 2022-06-01 ENCOUNTER — Encounter: Payer: Self-pay | Admitting: Physician Assistant

## 2022-06-01 VITALS — BP 128/67 | HR 71 | Temp 98.8°F | Resp 20 | Wt 323.5 lb

## 2022-06-01 DIAGNOSIS — C320 Malignant neoplasm of glottis: Secondary | ICD-10-CM | POA: Diagnosis not present

## 2022-06-01 DIAGNOSIS — Z8521 Personal history of malignant neoplasm of larynx: Secondary | ICD-10-CM | POA: Diagnosis not present

## 2022-06-01 DIAGNOSIS — C760 Malignant neoplasm of head, face and neck: Secondary | ICD-10-CM | POA: Diagnosis not present

## 2022-06-01 DIAGNOSIS — Z923 Personal history of irradiation: Secondary | ICD-10-CM | POA: Insufficient documentation

## 2022-06-01 MED ORDER — OXYMETAZOLINE HCL 0.05 % NA SOLN
2.0000 | Freq: Once | NASAL | Status: AC
  Administered 2022-06-01: 2 via NASAL
  Filled 2022-06-01: qty 30

## 2022-06-02 ENCOUNTER — Ambulatory Visit
Admission: RE | Admit: 2022-06-02 | Discharge: 2022-06-02 | Disposition: A | Discharge status: Home / Self Care | Payer: Medicare HMO | Source: Ambulatory Visit | Attending: Physician Assistant | Admitting: Physician Assistant

## 2022-06-02 DIAGNOSIS — G939 Disorder of brain, unspecified: Secondary | ICD-10-CM

## 2022-06-02 DIAGNOSIS — I6782 Cerebral ischemia: Secondary | ICD-10-CM | POA: Diagnosis not present

## 2022-06-02 MED ORDER — GADOPICLENOL 0.5 MMOL/ML IV SOLN
10.0000 mL | Freq: Once | INTRAVENOUS | Status: AC | PRN
  Administered 2022-06-02: 10 mL via INTRAVENOUS

## 2022-06-03 NOTE — Progress Notes (Signed)
Radiation Oncology         [phone]) [phone] ________________________________  Name: Jesus [name] MRN: [id number]  Date: 06/01/2022  DOB: 06/12/1955  Follow-Up Visit Note  CC: Default, Provider, MD  Shirline Frees, MD  Diagnosis and Prior Radiotherapy:       ICD-10-CM   1. Malignant neoplasm of glottis ([place])  C32.0 oxymetazoline (AFRIN) 0.05 % nasal spray 2 spray      Cancer Staging  Diffuse large B-cell lymphoma of lymph nodes of multiple regions [facility]) Staging form: Hodgkin and Non-Hodgkin Lymphoma, AJCC 8th Edition - Clinical: Stage IV - Signed by Eppie Gibson, MD on 07/13/2018  Malignant neoplasm of glottis [facility]) Staging form: Larynx - Glottis, AJCC 8th Edition - Clinical stage from 10/02/2020: Stage I (cT1a, cN0, cM0) - Signed by Eppie Gibson, MD on 10/02/2020 Stage prefix: Initial diagnosis   Radiation Treatment Dates: 10/15/2020 through 11/25/2020 Site Technique Total Dose (Gy) Dose per Fx (Gy) Completed Fx Beam Energies  Larynx: HN_larynx 3D 63/63 2.25 28/28 6X   CHIEF COMPLAINT:  Here for follow-up and surveillance of throat cancer  Narrative:    Jesus [name] presents for follow-up after completing radiation to his larynx on 11/25/2020   Interval history notable for negative glottic bx in Sept 2023, development of right neck mass, signs of adenopathy and fullness in throat on CT neck/chest in Dec (I personally reviewed imaging), bx of right neck + for SCC.  Pain issues, if any: Denies any current pain; did have a sore throat and bilateral ear pain earlier in the month, but reports it resolved this week Using a feeding tube?: N/A Weight changes, if any:  Wt Readings from Last 3 Encounters:  06/01/22 (!) 323 lb 8 oz (146.7 kg)  05/18/22 (!) 320 lb 14.4 oz (145.6 kg)  02/03/22 (!) 310 lb (140.6 kg)   Swallowing issues, if any: Denies any issues (aside from crunchy foods which cause him to cough) Smoking or chewing tobacco? None Using fluoride trays daily? N/A--sees his  periodontist regularly and is scheduled for dental work at [place] [facility] visit was on:  Saw Dr. 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 Seen by Dr. Isidore Moos for follow-up laryngoscopy the patient was noted to have a small area of concern involving the right false vocal cord. He underwent laryngoscopy and biopsy on 02/03/2022 and pathology showed postradiation changes, benign findings with mild atypia, no evidence of recurrent carcinoma. Flexible laryngoscopy performed today shows normal vocal cord mobility no evidence of ulcer, mass or lesion. Previous biopsy site shows no erythema, ulcer or swelling. Patient reassured regarding today's findings. Recommend follow-up as scheduled in the cancer center for repeat screening in January, return to our office in April for interval 49-monthevaluations. Patient has a history of non-Hodgkin's lymphoma and is also followed through medical oncology.  Other notable issues, if any:  Had lymph node biopsied last week 05/25/2022  DIAGNOSIS:  A. LYMPH NODE, RIGHT NECK; ULTRASOUND-GUIDED CORE NEEDLE BIOPSY:  - POSITIVE FOR MALIGNANCY.  - METASTATIC KERATINIZING SQUAMOUS CELL CARCINOMA.  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   Scheduled for MRI Brain w/ & w/o contrast on 06/02/22 and PET scan on 06/08/22  Per Dr. KGrier [place] office note 05/18/2022 PLAN: -Discussed lab  results from today, 05/18/2022, with the patient. CBC shows slightly decreased hemoglobin of 12.5 K and slightly decreased hematocrit of 37.0. CMP shows slightly elevated glucose level of 182.  -Discussed the CT scan results form December 2023 with the patient. CT scan showed Interval development of a 2.3 x 2 cm  heterogeneous and apparently centrally necrotic right posterior cervical chain node at approximately the C3-4 level. -Plan PET scan before next visit.  -Plan on ultrasound core needle biopsy of his enlarged right neck nodule.   FOLLOW-UP: - Ultrasound-guided core needle biopsy of right cervical lymph node ASAP - PET CT scan in 1 week - Return to clinic with Dr. Irene Limbo in 2 weeks  He reports congestion symptoms for weeks in nasal passages  and palpable right neck mass. Otherwise feels well. Recently lost multiple love ones last year and still grieving.               ALLERGIES:  is allergic to bee venom, antifungal [miconazole nitrate], and zolpidem tartrate er.  Meds: Current Outpatient Medications  Medication Sig Dispense Refill   amLODipine (NORVASC) 10 MG tablet TAKE 1 TABLET BY MOUTH EVERY DAY (Patient taking differently: Take 10 mg by mouth every evening.) 30 tablet 0   aspirin EC 81 MG tablet Take 81 mg by mouth every evening. Swallow whole.     b complex vitamins capsule Take 1 capsule by mouth daily.     lisinopril (ZESTRIL) 20 MG tablet TAKE 1 TABLET BY MOUTH EVERY DAY (Patient taking differently: Take 20 mg by mouth every evening.) 30 tablet 0   metFORMIN (GLUCOPHAGE) 500 MG tablet Take 500 mg by mouth daily.     Multiple Vitamins-Minerals (MULTI ADULT GUMMIES PO) Take 1 tablet by mouth in the morning. Centrum     OVER THE COUNTER MEDICATION Take 1 tablet by mouth daily. Immune plus     tadalafil (CIALIS) 20 MG tablet Take 20 mg by mouth daily as needed for erectile dysfunction.     No current facility-administered medications for this encounter.    Physical Findings: The patient is in no acute distress. Patient is alert and oriented. Wt Readings from Last 3 Encounters:  06/01/22 (!) 323 lb 8 oz (146.7 kg)  05/18/22 (!) 320 lb 14.4 oz (145.6 kg)  02/03/22 (!) 310 lb (140.6 kg)    weight is 323 lb 8 oz (146.7 kg) (abnormal). His oral temperature is 98.8 F (37.1 C). His  blood pressure is 128/67 and his pulse is 71. His respiration is 20 and oxygen saturation is 100%. .  General: Alert and oriented, in no acute distress HEENT: Head is normocephalic. Extraocular movements are intact. Oropharynx / mouth notable for no lesions  Neck: right cervical palpable neck mass, 2cm Skin: Skin of neck intact, healthy appearing Heart RRR Chest CTAB Psychiatric: Judgment and insight are intact. Affect is appropriate.  PROCEDURE NOTE: After obtaining consent and spraying nasal cavity with topical oxymetazoline, the flexible endoscope was coated w/ lidocaine gel and introduced and passed through the nasal cavity.  The nasopharynx, oropharynx, hypopharynx, and larynx  were then examined. Bilateral diffuse erythema and fullness in nasopharynx and majority of pharynx but less so inferior to pharynx. Findings are nonspecific. The true cords were symmetrically mobile.  No obvious tumor on exam. He tolerated the procedure well.    Lab Findings: Lab Results  Component Value Date   WBC 5.5 05/18/2022   HGB 12.5 (L) 05/18/2022   HCT 37.0 (L) 05/18/2022   MCV 87.3  05/18/2022   PLT 296 05/18/2022    Lab Results  Component Value Date   TSH 2.051 05/18/2022    Radiographic Findings: MR BRAIN W WO CONTRAST  Result Date: 06/02/2022 CLINICAL DATA:  Provided history: Lesion of brain. Headaches. EXAM: MRI HEAD WITHOUT AND WITH CONTRAST TECHNIQUE: Multiplanar, multiecho pulse sequences of the brain and surrounding structures were obtained without and with intravenous contrast. CONTRAST:  10 mL Vueway intravenous contrast. COMPARISON:  Head CT 05/05/2022. FINDINGS: Mild intermittent motion degradation. Brain: No age advanced or lobar predominant parenchymal atrophy. Moderate for age multifocal T2 FLAIR hyperintense signal abnormality within the cerebral white matter, nonspecific but compatible with chronic small vessel ischemic disease. Advanced chronic small vessel ischemic changes within  the pons. Chronic small-vessel ischemic changes are also present within the bilateral thalami, including a chronic right thalamic lacunar infarct. There is no acute infarct. No evidence of an intracranial mass. No chronic intracranial blood products. No extra-axial fluid collection. No midline shift. No pathologic intracranial enhancement identified. Vascular: Maintained flow voids within the proximal large arterial vessels. Skull and upper cervical spine: No focal suspicious marrow lesion. Incompletely assessed cervical spondylosis. Sinuses/Orbits: No mass or acute finding within the imaged orbits. Prior bilateral ocular lens replacement. Bilateral posterior staphyloma globe deformities. Moderate mucosal thickening within the bilateral maxillary sinuses. Other: Trace fluid within the bilateral mastoid air cells. IMPRESSION: 1. No evidence of acute intracranial abnormality. 2. Chronic small-vessel ischemic changes which are moderate in the cerebral white matter, and advanced in the pons. 3. Chronic small-vessel ischemic changes are also present within the bilateral thalami, including a chronic right thalamic lacunar infarct. 4. Moderate mucosal thickening within the bilateral maxillary sinuses. Electronically Signed   By: Kellie Simmering D.O.   On: 06/02/2022 15:55   [place] CORE BIOPSY (LYMPH NODES)  Result Date: 05/25/2022 INDICATION: History of lymphoma and squamous cell carcinoma, abnormal right cervical adenopathy EXAM: ULTRASOUND CORE BIOPSY RIGHT CERVICAL ADENOPATHY MEDICATIONS: 1% LIDOCAINE LOCAL ANESTHESIA/SEDATION: None. COMPLICATIONS: None immediate. PROCEDURE: Informed written consent was obtained from the patient after a thorough discussion of the procedural risks, benefits and alternatives. All questions were addressed. Maximal Sterile Barrier Technique was utilized including caps, mask, sterile gowns, sterile gloves, sterile drape, hand hygiene and skin antiseptic. A timeout was performed prior to the  initiation of the procedure. Previous imaging reviewed. Preliminary ultrasound performed. The right neck abnormal adenopathy was localized and marked. This correlates with the neck CT finding. Under sterile conditions and local anesthesia, an 18 gauge core biopsy needle was advanced to the right neck adenopathy. 18 gauge core biopsies obtained. Samples were intact and non fragmented. These were placed on a saline moistened Telfa. Postprocedure imaging demonstrates no hemorrhage or hematoma. Patient tolerated the procedure well. No immediate complication. IMPRESSION: Successful ultrasound-guided core biopsy of right neck abnormal lymph node. Electronically Signed   By: Jerilynn Mages.  Shick M.D.   On: 05/25/2022 14:02   CT SOFT TISSUE NECK W CONTRAST  Result Date: 05/07/2022 CLINICAL DATA:  History of large B-cell lymphoma, head and neck cancer, right cervical lymph node mass resection. EXAM: CT NECK WITH CONTRAST TECHNIQUE: Multidetector CT imaging of the neck was performed using the standard protocol following the bolus administration of intravenous contrast. RADIATION DOSE REDUCTION: This exam was performed according to the departmental dose-optimization program which includes automated exposure control, adjustment of the mA and/or kV according to patient size and/or use of iterative reconstruction technique. CONTRAST:  160m ISOVUE-300 IOPAMIDOL (ISOVUE-300) INJECTION 61% COMPARISON:  Similar study of 08/27/2020  FINDINGS: Technical note: There is excessive beam hardening and streak artifact below the level of C3 due to superimposition of the patient's shoulders. This limits fine detail. Pharynx and larynx: There is interval increased symmetric fullness in the adenoidal eminence, the lingual tonsils and palatine tonsils. This could be due to pharyngitis or lymphoproliferative etiology. Encroachment on the posterior nasopharynx is seen due to the prominent adenoids, with narrowing of the pharyngeal airway due to the  prominent palatine tonsils. There is no underlying abscess. The epiglottis is normal. The vallecula remain partially aerated. Piriform sinuses are effaced. There are small laryngoceles which were noted previously. There previously was a mild thickening of the right vocal fold. Due to apposition, the vocal folds are poorly visualized on today's but do not show an obvious mass, adjacent cartilage erosion or paraglottic fat infiltration. Please note that the larynx is not as well seen as previously due to the streak artifacts described above. Salivary glands: No inflammation, mass, or stone. Thyroid: No mass or enlargement. Lymph nodes: There is interval development of an enlarged heterogeneous and apparently centrally necrotic right posterior cervical chain node, level of approximately C3-4 measuring 2.3 x 2 cm on 11:40. Just lateral to this there is a lymph node of 7 mm in short axis. Additional scattered shotty subcentimeter bilateral posterior cervical chain nodes are unaltered as well as can be seen. There are stable bilateral jugular chain nodes up to 8 mm in short axis. Stable subcentimeter submental and submandibular nodes. There is no supraclavicular adenopathy visible through the streak artifact. Vascular: No significant findings. Limited intracranial: Negative. Visualized orbits: Not imaged. Mastoids and visualized paranasal sinuses: Small retention cyst or polyp posterior right maxillary sinus. Trace fluid right mastoid tip. Otherwise clear. Skeleton: Degenerative changes cervical spine, short cervical pedicles reducing the effective AP spinal canal diameter. There is uncinate hypertrophy and foraminal sclerosis at C6-7 and C7-T1 with degenerative discs. There is spondylosis with posterior disc osteophyte complexes mildly compressing the cord at C5-6 and C6-7. No acute or aggressive bone lesion is seen. Upper chest: See separate chest CT report contemporaneously done. Other: None. IMPRESSION: 1. Interval  development of a 2.3 x 2 cm heterogeneous and apparently centrally necrotic right posterior cervical chain node at approximately the C3-4 level. 2. Interval increased symmetric fullness in the adenoidal eminence, lingual tonsils and palatine tonsils. This could be due to pharyngitis or lymphoproliferative etiology. 3. Encroachment on the posterior nasopharynx due to prominent adenoids, with narrowing of the pharyngeal airway due to the prominent palatine tonsils. 4. Previously there was mild thickening of the right vocal fold. Due to apposition and streak artifact through the larynx, the vocal folds are poorly visualized on today's but do not show an obvious mass, adjacent cartilage erosion or paraglottic fat infiltration. 5. Degenerative changes of the cervical spine with short pedicles reducing the effective AP spinal canal diameter. 6. Posterior disc osteophyte complexes mildly compressing the cord at C5-6 and C6-7. Electronically Signed   By: Telford Nab M.D.   On: 05/07/2022 01:12   CT HEAD W & WO CONTRAST (5MM)  Result Date: 05/07/2022 CLINICAL DATA:  History of B-cell lymphoma with right-sided cervical lymph node mass resection. Head and neck cancer. EXAM: CT HEAD WITHOUT AND WITH CONTRAST TECHNIQUE: Contiguous axial images were obtained from the base of the skull through the vertex without and with intravenous contrast. RADIATION DOSE REDUCTION: This exam was performed according to the departmental dose-optimization program which includes automated exposure control, adjustment of the mA and/or kV  according to patient size and/or use of iterative reconstruction technique. CONTRAST:  130m ISOVUE-300 IOPAMIDOL (ISOVUE-300) INJECTION 61% COMPARISON:  Head CT without contrast 07/08/2021. FINDINGS: Brain: No evidence of acute infarction, hemorrhage, hydrocephalus, extra-axial collection or mass lesion/mass effect. There is mild cerebral atrophy and small-vessel disease. Ventricles are normal in size and  position. There are no old territorial infarcts. There is however, a new 7 mm hypodensity in the anterior aspect of the right thalamus compatible with either a late subacute or chronic nonhemorrhagic lacunar infarct having occurred since 07/08/2021. This does not show postcontrast enhancement. No enhancing lesions seen elsewhere in the posterior fossa and supratentorial space either. Vascular: No hyperdense vessel or unexpected calcification. Visible vessels are patent. Skull:   Negative for fracture or focal lesion. Sinuses/Orbits: Small retention cyst again noted posterior right maxillary sinus. Other visible sinuses, bilateral mastoid air cells, and middle ears are clear apart from trace chronic fluid in the right mastoid tip. There is pneumatization of both petrous apices. 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 There is no extraocular muscle thickening. Other: Mild left deviation and spurring of the nasal septum. IMPRESSION: 1. No acute intracranial CT findings or enhancing lesions. 2. New 7 mm hypodensity in the anterior right thalamus compatible with either a late-subacute or chronic nonhemorrhagic lacunar infarct having occurred since 07/08/2021. Consider follow-up MRI without and with contrast if clinically warranted. 3. Mild cerebral atrophy and small-vessel disease. 4. Bilateral proptosis with bilateral uveoscleral staphylomas. The proptosis could be related to obesity, chronic corticosteroid use or thyroid disease. Electronically Signed   By: KTelford NabM.D.   On: 05/07/2022 00:40   CT CHEST W CONTRAST  Result Date: 05/07/2022 CLINICAL DATA:  History of large B-cell lymphoma and head and neck cancer. Right cervical lymph node mass resection EXAM: CT CHEST WITH CONTRAST TECHNIQUE: Multidetector CT imaging of the chest was performed during intravenous contrast administration. RADIATION DOSE  REDUCTION: This exam was performed according to the departmental dose-optimization program which includes automated exposure control, adjustment of the mA and/or kV according to patient size and/or use of iterative reconstruction technique. CONTRAST:  1025mISOVUE-300 IOPAMIDOL (ISOVUE-300) INJECTION 61% COMPARISON:  Chest, abdomen and pelvis CT with IV contrast 01/31/2022, CT without contrast 10/06/2020 FINDINGS: Cardiovascular: There is mild cardiomegaly with a left chamber predominance. No pericardial effusion. Trace calcification LAD coronary artery. Pulmonary arteries and veins are normal caliber with clear central pulmonary arteries. Negligible aortic atherosclerosis noted with otherwise normal aorta and great vessels. Mediastinum/Nodes: There is interval enlargement of a left paratracheal nodal mass. This previously measured 1.6 x 1.2 cm AP and transverse and today measures 2.8 x 1.9 cm on 6:18. There is a stable 9 mm in short axis right paratracheal space node. No interval change in small subcentimeter in short axis bilateral axillary nodes is seen. Subcarinal lymph node to the right again measures 1.1 cm in short axis. There is no hilar or further mediastinal adenopathy. The thoracic trachea and thoracic esophagus are unremarkable. No thyroid mass is seen. Lungs/Pleura: No pleural effusion, thickening or pneumothorax. Mild centrilobular emphysema. Chronic linear scar-like opacity in the lingula. Chronic paraspinal scarring right lower lobe. The lungs are otherwise clear. Upper Abdomen: There is hepatic steatosis. Bilateral renal cysts are again partially visible. No acute upper abdominal findings or mass. Musculoskeletal: There is thoracic spondylosis. No primary pathologic bone process is seen. There is no chest wall mass. IMPRESSION: 1. Interval enlargement  of a left paratracheal nodal mass now measuring 2.8 x 1.9 cm, previously 1.6 x 1.2 cm. Concern for metastasis or lymphoma recurrence. Other minimally  prominent mediastinal nodes are unchanged. 2. No other interval change. 3. Emphysema. 4. Cardiomegaly. 5. Aortic and coronary artery atherosclerosis. 6. Hepatic steatosis. Aortic Atherosclerosis (ICD10-I70.0) and Emphysema (ICD10-J43.9). Electronically Signed   By: Telford Nab M.D.   On: 05/07/2022 00:28    Impression/Plan:    1) H+N cancer status: Interval history notable for negative glottic bx in Sept 2023, development of right neck mass, signs of adenopathy and fullness in throat on CT neck/chest in Dec (I personally reviewed imaging), bx of right neck + for SCC.  This patient has recurrent vs new primary disease -  evidenced by squamous cell carcinoma in neck - and is completing workup for that.  He has an amazing outlook and resilience but could still benefit form extra support  I think he would be receptive to support from the counseling team in spiritual care.  He lost more than one friend/loved one last year unexpectedly and now dealing w/ cancer recurrence. Referral made  We will present his case at ENT tumor board. PET pending.  2) Nutritional Status: No active issues, eating well Wt Readings from Last 3 Encounters:  06/01/22 (!) 323 lb 8 oz (146.7 kg)  05/18/22 (!) 320 lb 14.4 oz (145.6 kg)  02/03/22 (!) 310 lb (140.6 kg)   PEG tube: None  3) Risk Factors: The patient has been educated about risk factors including alcohol and smoking /tobacco abuse; they understand that avoidance of alcohol and smoking/tobacco is important to prevent recurrences as well as other cancers.    4) Swallowing: no issues  5)  Thyroid function: Check annually with PCP or at cancer center -[facility] Lab Results  Component Value Date   TSH 2.051 05/18/2022   6)  Follow-up with me pending tumor board and PET results. Has followup with Dr Irene Limbo after PET.   On date of service, in total, I spent 45 minutes on this encounter. Patient was seen in person.  This note was signed the day after encounter but the  minutes above reflect my time spent on date of service _____________________________________   Eppie Gibson, MD

## 2022-06-06 ENCOUNTER — Inpatient Hospital Stay (HOSPITAL_BASED_OUTPATIENT_CLINIC_OR_DEPARTMENT_OTHER): Payer: Medicare HMO | Admitting: Hematology

## 2022-06-06 ENCOUNTER — Encounter: Payer: Self-pay | Admitting: General Practice

## 2022-06-06 VITALS — BP 127/51 | HR 79 | Temp 97.2°F | Resp 20 | Wt 325.9 lb

## 2022-06-06 DIAGNOSIS — C760 Malignant neoplasm of head, face and neck: Secondary | ICD-10-CM | POA: Diagnosis not present

## 2022-06-06 DIAGNOSIS — Z86718 Personal history of other venous thrombosis and embolism: Secondary | ICD-10-CM | POA: Diagnosis not present

## 2022-06-06 DIAGNOSIS — Z79899 Other long term (current) drug therapy: Secondary | ICD-10-CM | POA: Diagnosis not present

## 2022-06-06 DIAGNOSIS — Z8521 Personal history of malignant neoplasm of larynx: Secondary | ICD-10-CM | POA: Diagnosis not present

## 2022-06-06 DIAGNOSIS — Z87891 Personal history of nicotine dependence: Secondary | ICD-10-CM | POA: Diagnosis not present

## 2022-06-06 DIAGNOSIS — Z8572 Personal history of non-Hodgkin lymphomas: Secondary | ICD-10-CM | POA: Diagnosis not present

## 2022-06-06 NOTE — Progress Notes (Signed)
[facility] Spiritual Care Note  Referred by Dr Isidore Moos for additional layer of support and connection with resources.  Reached Mr Settles by phone. He reports good support from family, including niece and grandson, as well as friends, and describes himself as someone who generally keeps to himself and doesn't talk much about what's going on for him.   Introduced [place] as part of his support team and ensured that he is aware of ongoing [place]. Because he notes that he is unlikely to reach out himself, we plan to follow up by phone in two weeks for pastoral check-in.   [place], [place], [facility] Pager [phone] Voicemail [phone]

## 2022-06-06 NOTE — Progress Notes (Signed)
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 male is here because of left lower extremity edema and lymphadenopathy.  The patient was seen in the emergency room this past Friday for the same issue.  A CT of the abdomen and pelvis was performed showing bulky left inguinal, left hemipelvic, and retroperitoneal adenopathy.  He was referred to [place] from the emergency room for further evaluation.  Doppler ultrasound of the left lower extremity was performed and was negative for DVT. Patient reports that he has been having left lower extremity edema in his left groin and left leg for approximately 1 month.  He states that the swelling in the left groin started to get better but then worsened.  The left lower extremity edema has slowly worsened over time.  Patient denies having fevers and chills.  He reports that he does have night sweats at times.  He reported having headaches approximate 1 [place]  He thinks his headaches are related to not having his blood pressure medication.  His headaches have now resolved.  He denies visual changes.  The patient denies chest pain, shortness of breath and cough.  No nausea, vomiting, constipation, diarrhea.  Denies abdominal pain.  The patient denies recent weight loss and has actually  gained weight recently.  Patient denies epistaxis, bleeding gums, hemoptysis, hematuria, but occasionally, and melena.  He reports increased urinary frequency over the past month but no dysuria.  The patient is here for evaluation and discussion of his recent CT and lab findings.  Interval History:   Jesus [name] is here for continued evaluation and management of his history of T-cell rich B-cell lymphoma and vocal cord squamous Cell carcinoma.  Patient was last seen by me on 05/18/2022 and was doing well overall. He was recovering from bronchitis.  Patient reports he has been doing well overall since our last visit. Patient reports he had MRI on 06/02/2022, which caused him swollen face. He is taking Benadryl.    He denies swallowing problems, but complains of cough with occasional phlegm. He notes that his cough is slowly improving . Patient complains of right leg pain which is worse than a cramp, which happens every 3-4 months. He notes that this occasionally affects him to stand up.   He denies fever, chills, night sweats, unexpected weight loss, new infection issues, new lumps/bumps, neck pain, back pain, bone pain, abdominal pain, chest pain, or leg swelling.   Patient currently does not follow up with a Neurologist.    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- last radiation  06-2018   Cataract    removed both eyes with l;ens implants    Family history of colon cancer    in his brother- dx'd age 2    History of chemotherapy    last 06-01-2018   History of kidney stones    History of radiation therapy    last radiation 06-2018   Hyperlipidemia    currently under control   Hypertension    Irregular heart beats    Lymphadenopathy    Pain, lower leg    Bilateral    Peripheral arterial disease (HCC)    Pre-diabetes    Red-green color blindness    RLS (restless legs syndrome)    Snores    Wears glasses     SURGICAL HISTORY: Past Surgical History:  Procedure Laterality Date   CATARACT EXTRACTION W/ INTRAOCULAR LENS  IMPLANT, BILATERAL     COLONOSCOPY     DIRECT LARYNGOSCOPY Right 02/03/2022   Procedure: DIRECT LARYNGOSCOPY WITH BIOPSY OF RIGHT FALSE VOCAL CORD;  Surgeon: Jerrell Belfast, MD;  Location: [place];  Service: ENT;  Laterality: Right;   dislodged salava stone     FRACTURE SURGERY     HAND ARTHROPLASTY  1995   crushed left hand   INGUINAL LYMPH NODE BIOPSY Left 01/02/2018   Procedure: LEFT INGUINAL LYMPH NODE BIOPSY;  Surgeon: Rolm Bookbinder, MD;  Location: [place];  Service: General;  Laterality: Left;   IR IMAGING GUIDED PORT INSERTION  01/15/2018   IR REMOVAL TUN ACCESS W/ PORT W/O FL MOD SED  03/11/2019   MICROLARYNGOSCOPY Left 01/17/2014   Procedure: MICROLARYNGOSCOPY WITH EXCISION OF THE BIOPSY OF LEFT VOCAL CORD LESION;  Surgeon: Izora Gala, MD;  Location: [place];  Service: ENT;  Laterality: Left;   MICROLARYNGOSCOPY N/A 09/16/2020   Procedure: MICROLARYNGOSCOPY with Biopsy of vocal cord lesion;  Surgeon: Jerrell Belfast, MD;  Location: [place];  Service: ENT;  Laterality: N/A;   ORIF FOOT FRACTURE  2005   left   REFRACTIVE SURGERY Right    removed cloudiness in right eye after cataract removal     SOCIAL HISTORY: Social History   Socioeconomic History   Marital status: Divorced    Spouse name: Not on file   Number of children: 3   Years of education: Not on file   Highest education level: Not on file  Occupational History   Not on file  Tobacco Use   Smoking status: Former    Packs/day: 0.50    Years: 36.00    Total pack years: 18.00    Types: Cigarettes    Quit date: 09/28/2020    Years since quitting: 1.6   Smokeless tobacco: Never   Tobacco comments:    he denies smoking in about 2 weeks  07/13/18  Vaping Use   Vaping Use: Never used  Substance and Sexual Activity   Alcohol use: Yes    Alcohol/week: 6.0 standard drinks of alcohol    Types: 6 Cans of beer per week    Comment: weekends   Drug use: Yes    Types: Marijuana    Comment: reports cocaine usage ~2X/ month; last use 12/26/17   Sexual activity: Not on file  Other Topics Concern   Not on file  Social History Narrative   Not on file   Social Determinants of Health   Financial Resource Strain: Low Risk  (10/06/2020)   Overall Financial Resource Strain (CARDIA)    Difficulty of Paying Living Expenses: Not very hard  Food Insecurity: No Food Insecurity (10/06/2020)   Hunger Vital Sign    Worried About Running Out of Food in the Last Year: Never true    Ran Out of Food in the Last Year: Never true  Transportation Needs: No Transportation Needs (10/06/2020)   PRAPARE - Hydrologist (Medical): No    Lack of Transportation (Non-Medical): No  Physical Activity: Not on file  Stress: Not on file  Social Connections: Moderately Integrated (10/06/2020)   Social Connection and Isolation Panel [NHANES]    Frequency of Communication with Friends and Family: Three times a week    Frequency of Social Gatherings with Friends and Family: Three times a week    Attends Religious Services: 1 to 4 times per year    Active Member of Clubs or Organizations: Yes    Attends Archivist Meetings: 1 to 4 times per year    Marital Status: Divorced  Intimate Partner Violence: Not At Risk (07/13/2018)   Humiliation, Afraid, Rape, and Kick questionnaire    Fear of Current or Ex-Partner: No    Emotionally Abused: No    Physically Abused: No    Sexually Abused: No    FAMILY HISTORY: Family History  Problem Relation Age of Onset   Breast cancer Mother    Diabetes Father    Hypertension Father    Stroke Father    Mental illness Sister    Hypertension Daughter    Mental illness Daughter     Hypertension Brother    Colon cancer Brother 17       passed away 2018/12/15   Breast cancer Sister    Esophageal cancer Neg Hx    Colon polyps Neg Hx    Rectal cancer Neg Hx    Stomach cancer Neg Hx     ALLERGIES:  is allergic to bee venom, antifungal [miconazole nitrate], and zolpidem tartrate er.  MEDICATIONS:  Current Outpatient Medications  Medication Sig Dispense Refill   amLODipine (NORVASC) 10 MG tablet TAKE 1 TABLET BY MOUTH EVERY DAY (Patient taking differently: Take 10 mg by mouth every evening.) 30 tablet 0   aspirin EC 81 MG tablet Take 81 mg by mouth every evening. Swallow whole.     b complex vitamins capsule Take 1 capsule by mouth daily.     lisinopril (ZESTRIL) 20 MG tablet TAKE 1 TABLET BY MOUTH EVERY DAY (Patient taking differently: Take 20 mg by mouth every evening.) 30 tablet 0   metFORMIN (GLUCOPHAGE) 500 MG tablet Take 500 mg by mouth daily.     Multiple Vitamins-Minerals (MULTI ADULT GUMMIES PO) Take 1 tablet by mouth in the morning. Centrum     OVER THE COUNTER MEDICATION Take 1 tablet by mouth daily. Immune plus     tadalafil (CIALIS) 20 MG tablet Take 20 mg by mouth daily as needed for erectile dysfunction.     No current facility-administered medications for this visit.    REVIEW OF SYSTEMS:   10 Point review of Systems was done is negative except as noted above. PHYSICAL EXAMINATION:  Vitals:   06/06/22 1318  BP: (!) 127/51  Pulse: 79  Resp: 20  Temp: (!) 97.2 F (36.2 C)  SpO2: 99%    Filed Weights   06/06/22 1318  Weight: (!) 325 lb 14.4 oz (147.8 kg)    NAD GENERAL:alert, in no acute distress and comfortable SKIN: no acute rashes, no significant lesions EYES: conjunctiva are pink and non-injected, sclera anicteric OROPHARYNX: MMM,  no exudates, no oropharyngeal erythema or ulceration NECK: supple, no JVD LYMPH:  rt cervical nodule, no palpable lymphadenopathy in the axillary or inguinal regions LUNGS: clear to auscultation b/l with  normal respiratory effort HEART: regular rate & rhythm ABDOMEN:  normoactive bowel sounds , non tender, not distended. Extremity: no pedal edema PSYCH: alert & oriented x 3 with fluent speech NEURO: no focal motor/sensory deficits Please stop  LABORATORY DATA:   I have reviewed the data as listed    Latest Ref Rng & Units 05/18/2022   12:49 PM 02/03/2022   11:14 AM 12/29/2021    8:47 AM  CBC  WBC 4.0 - 10.5 K/uL 5.5  6.0  4.9   Hemoglobin 13.0 - 17.0 g/dL 12.5  12.1  11.6   Hematocrit 39.0 - 52.0 % 37.0  37.3  34.3   Platelets 150 - 400 K/uL 296  295  284    CBC    Component Value Date/Time   WBC 5.5 05/18/2022 1249   WBC 6.0 02/03/2022 1114   RBC 4.24 05/18/2022 1249   HGB 12.5 (L) 05/18/2022 1249   HCT 37.0 (L) 05/18/2022 1249   PLT 296 05/18/2022 1249   MCV 87.3 05/18/2022 1249   MCV 87.3 12/22/2017 1518   MCH 29.5 05/18/2022 1249   MCHC 33.8 05/18/2022 1249   RDW 13.4 05/18/2022 1249   LYMPHSABS 2.8 05/18/2022 1249   MONOABS 0.6 05/18/2022 1249   EOSABS 0.1 05/18/2022 1249   BASOSABS 0.0 05/18/2022 1249       Latest Ref Rng & Units 05/18/2022   12:49 PM 02/03/2022   11:14 AM 12/29/2021    8:47 AM  CMP  Glucose 70 - 99 mg/dL 182  190  154   BUN 8 - 23 mg/dL '16  15  15   '$ Creatinine 0.61 - 1.24 mg/dL 1.24  1.12  0.91   Sodium 135 - 145 mmol/L 136  137  138   Potassium 3.5 - 5.1 mmol/L 3.8  4.3  4.2   Chloride 98 - 111 mmol/L 101  103  104   CO2 22 - 32 mmol/L '28  22  26   '$ Calcium 8.9 - 10.3 mg/dL 9.2  9.5  9.4   Total Protein 6.5 - 8.1 g/dL 7.7   7.1   Total Bilirubin 0.3 - 1.2 mg/dL 0.4   0.7   Alkaline Phos 38 - 126 U/L 59   57   AST 15 - 41 U/L 14   17   ALT 0 - 44 U/L 8   15    . Lab Results  Component Value Date   LDH 168 05/18/2022   Tsh 1.42 (05/21/2021)   01/02/18 Left Inguinal LN Bx:   12/26/17 Tissue Flow Cytometry:   12/26/17 Inguinal Core biopsy:    RADIOGRAPHIC STUDIES: I have personally reviewed the radiological images as listed  and agreed with the findings in the report. MR BRAIN W WO CONTRAST  Result Date: 06/02/2022 CLINICAL DATA:  Provided history: Lesion of brain. Headaches. EXAM: MRI HEAD WITHOUT AND WITH CONTRAST TECHNIQUE: Multiplanar, multiecho pulse sequences of the brain and surrounding structures were obtained without and with intravenous contrast. CONTRAST:  10 mL Vueway intravenous contrast. COMPARISON:  Head CT 05/05/2022. FINDINGS: Mild intermittent motion degradation. Brain: No age advanced or lobar predominant parenchymal atrophy. Moderate for age multifocal T2 FLAIR hyperintense signal abnormality within the cerebral white matter, nonspecific but compatible with chronic small vessel ischemic disease. Advanced chronic small vessel ischemic changes within  the pons. Chronic small-vessel ischemic changes are also present within the bilateral thalami, including a chronic right thalamic lacunar infarct. There is no acute infarct. No evidence of an intracranial mass. No chronic intracranial blood products. No extra-axial fluid collection. No midline shift. No pathologic intracranial enhancement identified. Vascular: Maintained flow voids within the proximal large arterial vessels. Skull and upper cervical spine: No focal suspicious marrow lesion. Incompletely assessed cervical spondylosis. Sinuses/Orbits: No mass or acute finding within the imaged orbits. Prior bilateral ocular lens replacement. Bilateral posterior staphyloma globe deformities. Moderate mucosal thickening within the bilateral maxillary sinuses. Other: Trace fluid within the bilateral mastoid air cells. IMPRESSION: 1. No evidence of acute intracranial abnormality. 2. Chronic small-vessel ischemic changes which are moderate in the cerebral white matter, and advanced in the pons. 3. Chronic small-vessel ischemic changes are also present within the bilateral thalami, including a chronic right thalamic lacunar infarct. 4. Moderate mucosal thickening within the  bilateral maxillary sinuses. Electronically Signed   By: Kellie Simmering D.O.   On: 06/02/2022 15:55   [place] CORE BIOPSY (LYMPH NODES)  Result Date: 05/25/2022 INDICATION: History of lymphoma and squamous cell carcinoma, abnormal right cervical adenopathy EXAM: ULTRASOUND CORE BIOPSY RIGHT CERVICAL ADENOPATHY MEDICATIONS: 1% LIDOCAINE LOCAL ANESTHESIA/SEDATION: None. COMPLICATIONS: None immediate. PROCEDURE: Informed written consent was obtained from the patient after a thorough discussion of the procedural risks, benefits and alternatives. All questions were addressed. Maximal Sterile Barrier Technique was utilized including caps, mask, sterile gowns, sterile gloves, sterile drape, hand hygiene and skin antiseptic. A timeout was performed prior to the initiation of the procedure. Previous imaging reviewed. Preliminary ultrasound performed. The right neck abnormal adenopathy was localized and marked. This correlates with the neck CT finding. Under sterile conditions and local anesthesia, an 18 gauge core biopsy needle was advanced to the right neck adenopathy. 18 gauge core biopsies obtained. Samples were intact and non fragmented. These were placed on a saline moistened Telfa. Postprocedure imaging demonstrates no hemorrhage or hematoma. Patient tolerated the procedure well. No immediate complication. IMPRESSION: Successful ultrasound-guided core biopsy of right neck abnormal lymph node. Electronically Signed   By: Jerilynn Mages.  Shick M.D.   On: 05/25/2022 14:02    ASSESSMENT & PLAN:   This is a pleasant 68 y.o. 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   CT of the abdomen and pelvis performed on 12/22/2017 showed bulky left inguinal, left hemipelvic, and   retroperitoneal adenopathy.     01/02/18 Left inguinal LN Biopsy revealed T-Cell/histocyte rich Large B-Cell Lymphoma   12/27/17 ECHO revealed LV EF of 55-60%    01/05/18 PET/CT revealed Massively enlarged pelvic lymph nodes intense metabolic activity consistent lymphoma. 2. Additional hypermetabolic lymph nodes in the porta hepatis and retroperitoneum LEFT aorta. 3. Solitary hypermetabolic mediastinal lymph node in the upper LEFT Mediastinum. 4. 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 5. Normal spleen.     HIV non reactive on 12/22/2017.  Hep C and hep B serology negative.   03/14/18 PET/CT revealed PET-CT findings suggest an excellent response to chemotherapy. The abdominal lymphadenopathy has near completely resolved and demonstrates a near complete metabolic response. The pelvic and inguinal adenopathy has significantly decreased in size and the metabolic activity has significantly decreased. 2. Diffuse marrow activity likely due to chemotherapy and or marrow stimulating  drugs. I do not see any discrete persistent lesions.    04/17/18 CT Head revealed Subtle mesial caudothalamic hypodensities may be artifact though, the could reflect encephalitis or Wernicke's encephalopathy. Consider MRI of the head with and without contrast. 2. Mild chronic small vessel ischemic changes    S/p 6 cycles of EPOCH-R completed on 06/01/18  06/28/18 PET/CT revealed Continued good response to treatment. No residual measurable or hypermetabolic abdominal lymphadenopathy and no recurrent osseous disease. 2. Interval decrease in size of the left operator region lymph node and also the left inguinal lymph node. However, the both have small foci of slightly increased hypermetabolism which bears surveillance. 3. No new or progressive lymphadenopathy in the neck, chest, abdomen or pelvis.  S/p 39.6 Gy in 22 fractions between 07/25/18 and 08/23/18  02/13/2019 CT C/A/P ([id number])  ([id number]) revealed "1. Response to therapy of pelvic adenopathy compared to the PET of 06/28/2018. 2. No new or progressive disease. 3. Mild prostatomegaly. 4. 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 5. Hepatomegaly."   2) left lower extremity swelling- now resolved  Doppler ultrasound for DVT was negative in the left lower extremity.   Likely from venous compression +/- lymphatic obstruction from bulky left inguinal, left hemipelvic, and  retroperitoneal adenopathy.    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 -Discussed the MRI scan results from 06/02/2022 with the patient. Showed No evidence of acute intracranial abnormality. Chronic small-vessel ischemic changes which are moderate in the cerebral white matter, and advanced in the pons. Chronic small-vessel ischemic changes are also present within the bilateral thalami, including a chronic right thalamic lacunar infarct. Moderate mucosal thickening within the bilateral maxillary sinuses. -Discussed that we need to wait for PET scan results before we make the decision of paratracheal LN  biopsy. PET scan is scheduled this week.  -Recommended to follow up with Neurologist for his right leg/knee pain since it might be due to nerve problems.    FOLLOW-UP:   The total time spent in the appointment was 20 minutes* .  All of the patient's questions were answered with apparent satisfaction. The patient knows to call the clinic with any problems, questions or concerns.   Sullivan Lone MD MS AAHIVMS [facility] Hematology/Oncology Physician [facility]  .*Total Encounter Time as defined by the Centers for  Medicare and Medicaid Services includes, in addition to the face-to-face time of a patient visit (documented in the note above) non-face-to-face time: obtaining and reviewing outside history, ordering and reviewing medications, tests or procedures, care coordination (communications with other health care professionals or caregivers) and documentation in the medical record.   I, Cleda Mccreedy, am acting as a Education administrator for Sullivan Lone, MD. .I have reviewed the above documentation for accuracy and completeness, and I agree with the above. Brunetta Genera MD

## 2022-06-08 ENCOUNTER — Ambulatory Visit (HOSPITAL_COMMUNITY)
Admission: RE | Admit: 2022-06-08 | Discharge: 2022-06-08 | Disposition: A | Discharge status: Home / Self Care | Payer: Medicare HMO | Source: Ambulatory Visit | Attending: Hematology | Admitting: Hematology

## 2022-06-08 DIAGNOSIS — C8330 Diffuse large B-cell lymphoma, unspecified site: Secondary | ICD-10-CM

## 2022-06-08 DIAGNOSIS — R590 Localized enlarged lymph nodes: Secondary | ICD-10-CM

## 2022-06-08 DIAGNOSIS — C760 Malignant neoplasm of head, face and neck: Secondary | ICD-10-CM | POA: Diagnosis not present

## 2022-06-08 DIAGNOSIS — K5730 Diverticulosis of large intestine without perforation or abscess without bleeding: Secondary | ICD-10-CM | POA: Diagnosis not present

## 2022-06-08 DIAGNOSIS — K449 Diaphragmatic hernia without obstruction or gangrene: Secondary | ICD-10-CM | POA: Diagnosis not present

## 2022-06-08 DIAGNOSIS — Z8572 Personal history of non-Hodgkin lymphomas: Secondary | ICD-10-CM | POA: Diagnosis not present

## 2022-06-08 LAB — GLUCOSE, CAPILLARY: Glucose-Capillary: 168 mg/dL — ABNORMAL HIGH (ref 70–99)

## 2022-06-08 MED ORDER — FLUDEOXYGLUCOSE F - 18 (FDG) INJECTION
16.0200 | Freq: Once | INTRAVENOUS | Status: AC | PRN
  Administered 2022-06-08: 16.02 via INTRAVENOUS

## 2022-06-09 NOTE — Progress Notes (Signed)
This encounter was created in error - please disregard.

## 2022-06-15 ENCOUNTER — Other Ambulatory Visit: Payer: Self-pay

## 2022-06-15 DIAGNOSIS — R911 Solitary pulmonary nodule: Secondary | ICD-10-CM

## 2022-06-17 NOTE — Progress Notes (Signed)
Oncology Nurse Navigator Documentation   I called Mr. Callis today to inform him of the ENT tumor board conference recommendations on 06/15/22. It was recommended that he see Dr. Irene Limbo for follow up after his recent PET scan, he see ENT for follow up, and a referral be placed to pulmonology. I called him to inform him of the ENT visit that I scheduled today on 07/05/22 at 9:15 with Dr. Sabino Gasser. He is also aware that he will be getting a call from pulmonology and Dr. Irene Limbo for appointments with them. He knows to call me if he has any questions or concerns.   Harlow Asa RN, BSN, OCN Head & Neck Oncology Nurse [place] at [facility] Phone # [phone]  Fax # [phone]

## 2022-06-20 ENCOUNTER — Encounter: Payer: Self-pay | Admitting: General Practice

## 2022-06-20 NOTE — Progress Notes (Signed)
[facility] Spiritual Care Note  Attempted follow-up call, leaving voicemail with encouragement to return call. Will phone again later in the week if needed.   [place], [place], [facility] Pager [phone] Voicemail [phone]

## 2022-06-22 ENCOUNTER — Encounter: Payer: Self-pay | Admitting: General Practice

## 2022-06-22 LAB — SURGICAL PATHOLOGY

## 2022-06-22 NOTE — Progress Notes (Signed)
[facility] Spiritual Care Note  Followed up with Jesus [name] by phone. He was overall in good spirits, citing a matter-of-fact attitude and generally upbeat perspective on life. He is aware of ongoing Spiritual Care availability, should needs arise or circumstances change.   [place], [place], [facility] Pager [phone] Voicemail [phone]

## 2022-06-23 ENCOUNTER — Telehealth: Payer: Self-pay | Admitting: Hematology

## 2022-06-23 ENCOUNTER — Telehealth: Payer: Self-pay | Admitting: *Deleted

## 2022-06-23 NOTE — Telephone Encounter (Signed)
Call patient to follow-up on how he is doing.  He had his PET scan on 06/08/2022 which showed a hypermetabolic right neck lymph node as well as a left paratracheal mass. He has an appointment with ENT later this month and is awaiting a call for a cardiothoracic surgery consultation for biopsy of his left paratracheal lymph node. No other distant metastases noted. If he does not have any obvious ENT recurrence of his squamous cell carcinoma and only has oligometastatic disease with a single lymph node in the neck and upper mediastinum it might be beneficial to consider resection of the visible metastases and consider adjuvant chemotherapy thereafter. If this is not resectable or other signs of head and neck cancer or lung cancer are notable in the airway we might have to take a different treatment approach. Patient notes no other new symptoms at this time.  He does note a persistent cough. I updated our Head and neck nurse navigator [place]. Appreciate her help. Sullivan Lone MD MS

## 2022-06-23 NOTE — Telephone Encounter (Signed)
Called patient to inform of appt.for consultation with Dr. Doy Hutching- cardiothoracic on 07-08-22 @ 11:30 am, spoke with patient and he is aware of this appt.

## 2022-06-30 DIAGNOSIS — J0101 Acute recurrent maxillary sinusitis: Secondary | ICD-10-CM | POA: Diagnosis not present

## 2022-06-30 DIAGNOSIS — C109 Malignant neoplasm of oropharynx, unspecified: Secondary | ICD-10-CM | POA: Diagnosis not present

## 2022-06-30 DIAGNOSIS — E1165 Type 2 diabetes mellitus with hyperglycemia: Secondary | ICD-10-CM | POA: Diagnosis not present

## 2022-06-30 DIAGNOSIS — I10 Essential (primary) hypertension: Secondary | ICD-10-CM | POA: Diagnosis not present

## 2022-07-05 DIAGNOSIS — Z87891 Personal history of nicotine dependence: Secondary | ICD-10-CM | POA: Diagnosis not present

## 2022-07-05 DIAGNOSIS — C329 Malignant neoplasm of larynx, unspecified: Secondary | ICD-10-CM | POA: Diagnosis not present

## 2022-07-05 DIAGNOSIS — Z8572 Personal history of non-Hodgkin lymphomas: Secondary | ICD-10-CM | POA: Diagnosis not present

## 2022-07-05 DIAGNOSIS — C7989 Secondary malignant neoplasm of other specified sites: Secondary | ICD-10-CM | POA: Diagnosis not present

## 2022-07-05 DIAGNOSIS — C781 Secondary malignant neoplasm of mediastinum: Secondary | ICD-10-CM | POA: Diagnosis not present

## 2022-07-08 ENCOUNTER — Encounter: Payer: Medicare HMO | Admitting: Thoracic Surgery (Cardiothoracic Vascular Surgery)

## 2022-07-13 NOTE — Progress Notes (Signed)
Oncology Nurse Navigator Documentation   I called [name] today to remind him of his appointment with Dr. Roxan Hockey tomorrow at 3:30 and he verbalized that he was aware and planned to attend. I explained that if/when he had a biopsy completed I would get him scheduled with Dr. Isidore Moos and Dr. Irene Limbo for follow up appointments. He voiced understanding and knows to call me if he has any questions.  Harlow Asa RN, BSN, OCN Head & Neck Oncology Nurse [place] at [facility] Phone # [phone]  Fax # [phone]

## 2022-07-14 ENCOUNTER — Institutional Professional Consult (permissible substitution): Payer: Medicare HMO | Admitting: Thoracic Surgery (Cardiothoracic Vascular Surgery)

## 2022-07-14 ENCOUNTER — Encounter: Payer: Self-pay | Admitting: Thoracic Surgery (Cardiothoracic Vascular Surgery)

## 2022-07-14 ENCOUNTER — Other Ambulatory Visit: Payer: Self-pay | Admitting: *Deleted

## 2022-07-14 ENCOUNTER — Encounter: Payer: Self-pay | Admitting: *Deleted

## 2022-07-14 VITALS — BP 150/80 | HR 90 | Resp 20 | Ht 72.0 in | Wt 325.0 lb

## 2022-07-14 DIAGNOSIS — R590 Localized enlarged lymph nodes: Secondary | ICD-10-CM

## 2022-07-14 NOTE — Progress Notes (Signed)
PCP is Jesus Frees, MD Referring Provider is Jesus Gibson, MD  Chief Complaint  Patient presents with   Mediastinal Mass    Surgical consult    HPI: [name] is sent for consultation regarding left paratracheal adenopathy.  Jesus [name] is a 67 year old man with numerous medical problems including diffuse large B-cell lymphoma, squamous cell carcinoma of the larynx, tobacco abuse, obesity, hypertension, hyperlipidemia and type 2 diabetes.    He was diagnosed with non-Hodgkin's lymphoma in 2019.  Treated with chemoradiation.  Completed treatment in early 2020.  Was diagnosed with squamous cell carcinoma of the larynx in May 2022.  Stage I at time of diagnosis.  Treated with radiation.  Earlier this year he noted right neck mass.  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  PET/CT showed that mass was hypermetabolic and also showed an enlarged hypermetabolic left paratracheal lymph node.  He developed hoarseness a couple of weeks ago.  He says it has waxed and waned some but has not completely resolved.  No chest pain, pressure, tightness, or shortness of breath.  Does have heartburn with a history of reflux.  No weight gain or loss.  Quit smoking in 2022.  Zubrod Score: At the time of surgery this patient's most appropriate activity status/level should be described as: '[]'$     0    Normal activity, no symptoms '[x]'$     1    Restricted in physical strenuous activity but ambulatory, able to do out light work '[]'$     2    Ambulatory and capable of self care, unable to do work activities, up and about >50 % of waking hours                              '[]'$     3    Only limited self care, in bed greater than 50% of waking hours '[]'$     4    Completely disabled, no self care, confined to bed or chair '[]'$     5    Moribund  Past Medical History:  Diagnosis Date   Allergy    Anemia    during chemo   Arthritis    knee    Blood transfusion without reported diagnosis    Cancer  ([place])    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- last radiation 06-2018   Cataract    removed both eyes with l;ens implants    Family history of colon cancer    in his brother- dx'd age 48    History of chemotherapy    last 06-01-2018   History of kidney stones    History of radiation therapy    last radiation 06-2018   Hyperlipidemia    currently under control   Hypertension    Irregular heart beats    Lymphadenopathy    Pain, lower leg    Bilateral   Peripheral arterial disease (HCC)    Pre-diabetes    Red-green color blindness    RLS (restless legs syndrome)    Snores    Wears glasses     Past Surgical History:  Procedure Laterality Date   CATARACT EXTRACTION W/ INTRAOCULAR LENS  IMPLANT, BILATERAL     COLONOSCOPY     DIRECT LARYNGOSCOPY Right 02/03/2022   Procedure: DIRECT LARYNGOSCOPY WITH BIOPSY OF RIGHT FALSE VOCAL CORD;  Surgeon: Jesus Belfast, MD;  Location: [facility];  Service: ENT;  Laterality: Right;  dislodged salava stone     FRACTURE SURGERY     HAND ARTHROPLASTY  1995   crushed left hand   INGUINAL LYMPH NODE BIOPSY Left 01/02/2018   Procedure: LEFT INGUINAL LYMPH NODE BIOPSY;  Surgeon: Rolm Bookbinder, MD;  Location: [facility];  Service: General;  Laterality: Left;   IR IMAGING GUIDED PORT INSERTION  01/15/2018   IR REMOVAL TUN ACCESS W/ PORT W/O FL MOD SED  03/11/2019   MICROLARYNGOSCOPY Left 01/17/2014   Procedure: MICROLARYNGOSCOPY WITH EXCISION OF THE BIOPSY OF LEFT VOCAL CORD LESION;  Surgeon: Izora Gala, MD;  Location: [place];  Service: ENT;  Laterality: Left;   MICROLARYNGOSCOPY N/A 09/16/2020   Procedure: MICROLARYNGOSCOPY with Biopsy of vocal cord lesion;  Surgeon: Jesus Belfast, MD;  Location: [place];  Service: ENT;  Laterality: N/A;   ORIF FOOT FRACTURE  2005   left   REFRACTIVE SURGERY Right    removed cloudiness in right eye after cataract removal     Family History  Problem Relation Age of  Onset   Breast cancer Mother    Diabetes Father    Hypertension Father    Stroke Father    Mental illness Sister    Hypertension Daughter    Mental illness Daughter    Hypertension Brother    Colon cancer Brother 47       passed away 11/29/2018   Breast cancer Sister    Esophageal cancer Neg Hx    Colon polyps Neg Hx    Rectal cancer Neg Hx    Stomach cancer Neg Hx     Social History Social History   Tobacco Use   Smoking status: Former    Packs/day: 0.50    Years: 36.00    Total pack years: 18.00    Types: Cigarettes    Quit date: 09/28/2020    Years since quitting: 1.7   Smokeless tobacco: Never   Tobacco comments:    he denies smoking in about 2 weeks 07/13/18  Vaping Use   Vaping Use: Never used  Substance Use Topics   Alcohol use: Yes    Alcohol/week: 6.0 standard drinks of alcohol    Types: 6 Cans of beer per week    Comment: weekends   Drug use: Yes    Types: Marijuana    Comment: reports cocaine usage ~2X/ month; last use 12/26/17    Current Outpatient Medications  Medication Sig Dispense Refill   amLODipine (NORVASC) 10 MG tablet TAKE 1 TABLET BY MOUTH EVERY DAY (Patient taking differently: Take 10 mg by mouth every evening.) 30 tablet 0   aspirin EC 81 MG tablet Take 81 mg by mouth every evening. Swallow whole.     b complex vitamins capsule Take 1 capsule by mouth daily.     lisinopril (ZESTRIL) 20 MG tablet TAKE 1 TABLET BY MOUTH EVERY DAY (Patient taking differently: Take 20 mg by mouth every evening.) 30 tablet 0   metFORMIN (GLUCOPHAGE) 500 MG tablet Take 500 mg by mouth daily.     Multiple Vitamins-Minerals (MULTI ADULT GUMMIES PO) Take 1 tablet by mouth in the morning. Centrum     OVER THE COUNTER MEDICATION Take 1 tablet by mouth daily. Immune plus     tadalafil (CIALIS) 20 MG tablet Take 20 mg by mouth daily as needed for erectile dysfunction.     No current facility-administered medications for this visit.    Allergies  Allergen Reactions   Bee  Venom Anaphylaxis   Antifungal [  Miconazole Nitrate] Other (See Comments)    Other reaction(s): made his head hurt   Zolpidem Tartrate Er Other (See Comments)    Leg cramps    Review of Systems  Constitutional:  Negative for activity change, appetite change, fever and unexpected weight change.  HENT:  Positive for voice change. Negative for trouble swallowing.   Respiratory:  Negative for shortness of breath.   Cardiovascular:  Negative for chest pain and leg swelling.  Gastrointestinal:  Positive for abdominal pain (Reflux).  Musculoskeletal:  Positive for arthralgias, gait problem, joint swelling and myalgias.  Neurological:  Negative for seizures, syncope and weakness.  Hematological:  Positive for adenopathy. Does not bruise/bleed easily.    BP (!) 150/80   Pulse 90   Resp 20   Ht 6' (1.829 m)   Wt (!) 325 lb (147.4 kg)   SpO2 92% Comment: RA  BMI 44.08 kg/m  Physical Exam Vitals reviewed.  Constitutional:      General: He is not in acute distress.    Appearance: He is obese.  HENT:     Head: Normocephalic and atraumatic.     Mouth/Throat:     Comments: Hoarse voice Eyes:     General: No scleral icterus.    Extraocular Movements: Extraocular movements intact.  Cardiovascular:     Rate and Rhythm: Normal rate and regular rhythm.     Heart sounds: Normal heart sounds. No murmur heard. Pulmonary:     Effort: Pulmonary effort is normal. No respiratory distress.     Breath sounds: Normal breath sounds. No wheezing.  Abdominal:     General: There is no distension.     Palpations: Abdomen is soft.  Lymphadenopathy:     Cervical: Cervical adenopathy (Palpable mass right neck) present.  Skin:    General: Skin is warm and dry.  Neurological:     General: No focal deficit present.     Mental Status: He is alert and oriented to person, place, and time.     Cranial Nerves: No cranial nerve deficit.    Diagnostic Tests: NUCLEAR MEDICINE PET SKULL BASE TO THIGH    TECHNIQUE: 16.0 mCi F-18 FDG was injected intravenously. Full-ring PET imaging was performed from the skull base to thigh after the radiotracer. CT data was obtained and used for attenuation correction and anatomic localization.   Fasting blood glucose: 186 mg/dl   COMPARISON:  Neck and chest CT 05/05/2022.  PET-CT 06/28/2018.   FINDINGS: Mediastinal blood pool activity: SUV max 1.9   Liver activity: SUV max 2.8   NECK:   The recently demonstrated enlarged level II lymph node on the right measures 2.3 x 1.9 cm and is moderately hypermetabolic with an SUV max of 4.3.No other hypermetabolic cervical lymph nodes are identified. There is nonspecific mildly prominent metabolic activity within the nasopharynx (SUV max 3.5) and in the left pharyngeal tonsil (SUV max 3.1) which could be reactive.No other suspicious activity within the pharyngeal mucosal space.   Incidental CT findings: none   CHEST:   Hypermetabolic left paratracheal node measuring 3.4 x 2.0 cm on image 55/4 has an SUV max of 4.0. No other hypermetabolic mediastinal, hilar or axillary lymph nodes. No hypermetabolic pulmonary activity or suspicious nodularity.   Incidental CT findings: The lungs appear clear. Small hiatal hernia.   ABDOMEN/PELVIS:   There is no hypermetabolic activity within the liver, adrenal glands, spleen or pancreas. There is no hypermetabolic nodal activity in the abdomen or pelvis. Specifically, the previously demonstrated left pelvic  and inguinal adenopathy has resolved.   Incidental CT findings: Bilateral renal cysts for which no follow-up imaging is recommended. Mild aortoiliac atherosclerosis. Mild distal colonic diverticulosis.   SKELETON:   There is no hypermetabolic activity to suggest osseous metastatic disease.   Incidental CT findings: Chronic trabecular thickening in the iliac bones bilaterally is unchanged from previous PET-CT. Osteitis pubis and lower lumbar  spondylosis are noted.   IMPRESSION: 1. The recently demonstrated right cervical and left superior mediastinal adenopathy is moderately hypermetabolic, consistent with metastatic disease. 2. Nonspecific mildly prominent activity within the nasopharynx and left pharyngeal tonsil, potentially reactive. Recommend correlation with direct visualization. 3. The previously demonstrated left pelvic and inguinal adenopathy has resolved. No evidence of distant metastatic disease. No findings highly suspicious for recurrent lymphoma. 4.  Aortic Atherosclerosis (ICD10-I70.0).     Electronically Signed   By: Jesus [name] M.D.   On: 06/08/2022 17:02 I personally reviewed the PET/CT images.  There is a 3.4 x 2 cm hypermetabolic left paratracheal node high in the mediastinum.  Also hypermetabolic level 2 cervical lymph node.  Impression: Jesus [name] is a 67 year old man with numerous medical problems including diffuse large B-cell lymphoma, squamous cell carcinoma of the larynx, tobacco abuse, obesity, hypertension, hyperlipidemia and type 2 diabetes.    Treated for non-Hodgkin's lymphoma in 2019.  Later treated for squamous cell carcinoma of the larynx.  Stage I on presentation but now has confirmed cervical nodal metastasis.  Left paratracheal adenopathy-almost certainly metastatic squamous cell carcinoma from the larynx.  Other etiologies are in the differential but are very remote possibilities.  He likely has recurrent nerve involvement.  The node is not resectable, but is accessible to biopsy by mediastinoscopy significant to confirm the diagnosis so that he can initiate the appropriate treatment.  I described the proposed surgical procedure mediastinoscopy to Jesus [name] and his daughter.  I informed them of the general nature of the procedure including the need for general anesthesia and the incision to be used.  He understands we would plan to do this on an outpatient basis barring  complications.  I informed her of the indications, risks, benefits, and alternatives.  He understands the procedure is diagnostic and not therapeutic.  He understands the risks include, but are not limited to death, MI, DVT, PE, bleeding, possible need for conversion to open procedure for bleeding, infection, recurrent nerve injury (probably already present from the lymph node), pneumothorax, and in rare instances stroke.  He accepts the risks and wishes to proceed.  Plan:  Mediastinoscopy on Thursday 07/21/2022  Jesus Nakayama, MD [facility] [phone]

## 2022-07-14 NOTE — H&P (View-Only) (Signed)
PCP is Harris, William, MD Referring Provider is Squire, Sarah, MD  Chief Complaint  Patient presents with   Mediastinal Mass    Surgical consult    HPI: [name] is sent for consultation regarding left paratracheal adenopathy.  Jesus [name] is a 67-year-old man with numerous medical problems including diffuse large B-cell lymphoma, squamous cell carcinoma of the larynx, tobacco abuse, obesity, hypertension, hyperlipidemia and type 2 diabetes.    He was diagnosed with non-Hodgkin's lymphoma in 2019.  Treated with chemoradiation.  Completed treatment in early 2020.  Was diagnosed with squamous cell carcinoma of the larynx in May 2022.  Stage I at time of diagnosis.  Treated with radiation.  Earlier this year he noted right neck mass.  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  PET/CT showed that mass was hypermetabolic and also showed an enlarged hypermetabolic left paratracheal lymph node.  He developed hoarseness a couple of weeks ago.  He says it has waxed and waned some but has not completely resolved.  No chest pain, pressure, tightness, or shortness of breath.  Does have heartburn with a history of reflux.  No weight gain or loss.  Quit smoking in 2022.  Zubrod Score: At the time of surgery this patient's most appropriate activity status/level should be described as: []    0    Normal activity, no symptoms [x]    1    Restricted in physical strenuous activity but ambulatory, able to do out light work []    2    Ambulatory and capable of self care, unable to do work activities, up and about >50 % of waking hours                              []    3    Only limited self care, in bed greater than 50% of waking hours []    4    Completely disabled, no self care, confined to bed or chair []    5    Moribund  Past Medical History:  Diagnosis Date   Allergy    Anemia    during chemo   Arthritis    knee    Blood transfusion without reported diagnosis    Cancer  (HCC)    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- last radiation 06-2018   Cataract    removed both eyes with l;ens implants    Family history of colon cancer    in his brother- dx'd age 68    History of chemotherapy    last 06-01-2018   History of kidney stones    History of radiation therapy    last radiation 06-2018   Hyperlipidemia    currently under control   Hypertension    Irregular heart beats    Lymphadenopathy    Pain, lower leg    Bilateral   Peripheral arterial disease (HCC)    Pre-diabetes    Red-green color blindness    RLS (restless legs syndrome)    Snores    Wears glasses     Past Surgical History:  Procedure Laterality Date   CATARACT EXTRACTION W/ INTRAOCULAR LENS  IMPLANT, BILATERAL     COLONOSCOPY     DIRECT LARYNGOSCOPY Right 02/03/2022   Procedure: DIRECT LARYNGOSCOPY WITH BIOPSY OF RIGHT FALSE VOCAL CORD;  Surgeon: Shoemaker, David, MD;  Location: [facility] OR;  Service: ENT;  Laterality: Right;     dislodged salava stone     FRACTURE SURGERY     HAND ARTHROPLASTY  1995   crushed left hand   INGUINAL LYMPH NODE BIOPSY Left 01/02/2018   Procedure: LEFT INGUINAL LYMPH NODE BIOPSY;  Surgeon: Wakefield, Matthew, MD;  Location: [facility] OR;  Service: General;  Laterality: Left;   IR IMAGING GUIDED PORT INSERTION  01/15/2018   IR REMOVAL TUN ACCESS W/ PORT W/O FL MOD SED  03/11/2019   MICROLARYNGOSCOPY Left 01/17/2014   Procedure: MICROLARYNGOSCOPY WITH EXCISION OF THE BIOPSY OF LEFT VOCAL CORD LESION;  Surgeon: Jefry Rosen, MD;  Location: [facility];  Service: ENT;  Laterality: Left;   MICROLARYNGOSCOPY N/A 09/16/2020   Procedure: MICROLARYNGOSCOPY with Biopsy of vocal cord lesion;  Surgeon: Shoemaker, David, MD;  Location: [facility] OR;  Service: ENT;  Laterality: N/A;   ORIF FOOT FRACTURE  2005   left   REFRACTIVE SURGERY Right    removed cloudiness in right eye after cataract removal     Family History  Problem Relation Age of  Onset   Breast cancer Mother    Diabetes Father    Hypertension Father    Stroke Father    Mental illness Sister    Hypertension Daughter    Mental illness Daughter    Hypertension Brother    Colon cancer Brother 68       passed away 11-2018   Breast cancer Sister    Esophageal cancer Neg Hx    Colon polyps Neg Hx    Rectal cancer Neg Hx    Stomach cancer Neg Hx     Social History Social History   Tobacco Use   Smoking status: Former    Packs/day: 0.50    Years: 36.00    Total pack years: 18.00    Types: Cigarettes    Quit date: 09/28/2020    Years since quitting: 1.7   Smokeless tobacco: Never   Tobacco comments:    he denies smoking in about 2 weeks 07/13/18  Vaping Use   Vaping Use: Never used  Substance Use Topics   Alcohol use: Yes    Alcohol/week: 6.0 standard drinks of alcohol    Types: 6 Cans of beer per week    Comment: weekends   Drug use: Yes    Types: Marijuana    Comment: reports cocaine usage ~2X/ month; last use 12/26/17    Current Outpatient Medications  Medication Sig Dispense Refill   amLODipine (NORVASC) 10 MG tablet TAKE 1 TABLET BY MOUTH EVERY DAY (Patient taking differently: Take 10 mg by mouth every evening.) 30 tablet 0   aspirin EC 81 MG tablet Take 81 mg by mouth every evening. Swallow whole.     b complex vitamins capsule Take 1 capsule by mouth daily.     lisinopril (ZESTRIL) 20 MG tablet TAKE 1 TABLET BY MOUTH EVERY DAY (Patient taking differently: Take 20 mg by mouth every evening.) 30 tablet 0   metFORMIN (GLUCOPHAGE) 500 MG tablet Take 500 mg by mouth daily.     Multiple Vitamins-Minerals (MULTI ADULT GUMMIES PO) Take 1 tablet by mouth in the morning. Centrum     OVER THE COUNTER MEDICATION Take 1 tablet by mouth daily. Immune plus     tadalafil (CIALIS) 20 MG tablet Take 20 mg by mouth daily as needed for erectile dysfunction.     No current facility-administered medications for this visit.    Allergies  Allergen Reactions   Bee  Venom Anaphylaxis   Antifungal [  Miconazole Nitrate] Other (See Comments)    Other reaction(s): made his head hurt   Zolpidem Tartrate Er Other (See Comments)    Leg cramps    Review of Systems  Constitutional:  Negative for activity change, appetite change, fever and unexpected weight change.  HENT:  Positive for voice change. Negative for trouble swallowing.   Respiratory:  Negative for shortness of breath.   Cardiovascular:  Negative for chest pain and leg swelling.  Gastrointestinal:  Positive for abdominal pain (Reflux).  Musculoskeletal:  Positive for arthralgias, gait problem, joint swelling and myalgias.  Neurological:  Negative for seizures, syncope and weakness.  Hematological:  Positive for adenopathy. Does not bruise/bleed easily.    BP (!) 150/80   Pulse 90   Resp 20   Ht 6' (1.829 m)   Wt (!) 325 lb (147.4 kg)   SpO2 92% Comment: RA  BMI 44.08 kg/m  Physical Exam Vitals reviewed.  Constitutional:      General: He is not in acute distress.    Appearance: He is obese.  HENT:     Head: Normocephalic and atraumatic.     Mouth/Throat:     Comments: Hoarse voice Eyes:     General: No scleral icterus.    Extraocular Movements: Extraocular movements intact.  Cardiovascular:     Rate and Rhythm: Normal rate and regular rhythm.     Heart sounds: Normal heart sounds. No murmur heard. Pulmonary:     Effort: Pulmonary effort is normal. No respiratory distress.     Breath sounds: Normal breath sounds. No wheezing.  Abdominal:     General: There is no distension.     Palpations: Abdomen is soft.  Lymphadenopathy:     Cervical: Cervical adenopathy (Palpable mass right neck) present.  Skin:    General: Skin is warm and dry.  Neurological:     General: No focal deficit present.     Mental Status: He is alert and oriented to person, place, and time.     Cranial Nerves: No cranial nerve deficit.    Diagnostic Tests: NUCLEAR MEDICINE PET SKULL BASE TO THIGH    TECHNIQUE: 16.0 mCi F-18 FDG was injected intravenously. Full-ring PET imaging was performed from the skull base to thigh after the radiotracer. CT data was obtained and used for attenuation correction and anatomic localization.   Fasting blood glucose: 186 mg/dl   COMPARISON:  Neck and chest CT 05/05/2022.  PET-CT 06/28/2018.   FINDINGS: Mediastinal blood pool activity: SUV max 1.9   Liver activity: SUV max 2.8   NECK:   The recently demonstrated enlarged level II lymph node on the right measures 2.3 x 1.9 cm and is moderately hypermetabolic with an SUV max of 4.3.No other hypermetabolic cervical lymph nodes are identified. There is nonspecific mildly prominent metabolic activity within the nasopharynx (SUV max 3.5) and in the left pharyngeal tonsil (SUV max 3.1) which could be reactive.No other suspicious activity within the pharyngeal mucosal space.   Incidental CT findings: none   CHEST:   Hypermetabolic left paratracheal node measuring 3.4 x 2.0 cm on image 55/4 has an SUV max of 4.0. No other hypermetabolic mediastinal, hilar or axillary lymph nodes. No hypermetabolic pulmonary activity or suspicious nodularity.   Incidental CT findings: The lungs appear clear. Small hiatal hernia.   ABDOMEN/PELVIS:   There is no hypermetabolic activity within the liver, adrenal glands, spleen or pancreas. There is no hypermetabolic nodal activity in the abdomen or pelvis. Specifically, the previously demonstrated left pelvic   and inguinal adenopathy has resolved.   Incidental CT findings: Bilateral renal cysts for which no follow-up imaging is recommended. Mild aortoiliac atherosclerosis. Mild distal colonic diverticulosis.   SKELETON:   There is no hypermetabolic activity to suggest osseous metastatic disease.   Incidental CT findings: Chronic trabecular thickening in the iliac bones bilaterally is unchanged from previous PET-CT. Osteitis pubis and lower lumbar  spondylosis are noted.   IMPRESSION: 1. The recently demonstrated right cervical and left superior mediastinal adenopathy is moderately hypermetabolic, consistent with metastatic disease. 2. Nonspecific mildly prominent activity within the nasopharynx and left pharyngeal tonsil, potentially reactive. Recommend correlation with direct visualization. 3. The previously demonstrated left pelvic and inguinal adenopathy has resolved. No evidence of distant metastatic disease. No findings highly suspicious for recurrent lymphoma. 4.  Aortic Atherosclerosis (ICD10-I70.0).     Electronically Signed   By: William  Veazey M.D.   On: 06/08/2022 17:02 I personally reviewed the PET/CT images.  There is a 3.4 x 2 cm hypermetabolic left paratracheal node high in the mediastinum.  Also hypermetabolic level 2 cervical lymph node.  Impression: Jesus [name] is a 67-year-old man with numerous medical problems including diffuse large B-cell lymphoma, squamous cell carcinoma of the larynx, tobacco abuse, obesity, hypertension, hyperlipidemia and type 2 diabetes.    Treated for non-Hodgkin's lymphoma in 2019.  Later treated for squamous cell carcinoma of the larynx.  Stage I on presentation but now has confirmed cervical nodal metastasis.  Left paratracheal adenopathy-almost certainly metastatic squamous cell carcinoma from the larynx.  Other etiologies are in the differential but are very remote possibilities.  He likely has recurrent nerve involvement.  The node is not resectable, but is accessible to biopsy by mediastinoscopy significant to confirm the diagnosis so that he can initiate the appropriate treatment.  I described the proposed surgical procedure mediastinoscopy to Mr. Porco and his daughter.  I informed them of the general nature of the procedure including the need for general anesthesia and the incision to be used.  He understands we would plan to do this on an outpatient basis barring  complications.  I informed her of the indications, risks, benefits, and alternatives.  He understands the procedure is diagnostic and not therapeutic.  He understands the risks include, but are not limited to death, MI, DVT, PE, bleeding, possible need for conversion to open procedure for bleeding, infection, recurrent nerve injury (probably already present from the lymph node), pneumothorax, and in rare instances stroke.  He accepts the risks and wishes to proceed.  Plan:  Mediastinoscopy on Thursday 07/21/2022  Aftin Lye C Kristene Liberati, MD [facility] [phone]  

## 2022-07-18 NOTE — Progress Notes (Signed)
Surgical Instructions    Your procedure is scheduled on Thursday, 07/21/22.  Report to [facility] Main Entrance "A" at 5:30 A.M., then check in with the Admitting office.  Call this number if you have problems the morning of surgery:  [phone]   If you have any questions prior to your surgery date call [phone]: Open Monday-Friday 8am-4pm If you experience any cold or flu symptoms such as cough, fever, chills, shortness of breath, etc. between now and your scheduled surgery, please notify us at the above number     Remember:  Do not eat after midnight the night before your surgery  You may drink clear liquids until 5:00am the morning of your surgery.   Clear liquids allowed are: Water, Non-Citrus Juices (without pulp), Carbonated Beverages, Clear Tea, Black Coffee ONLY (NO MILK, CREAM OR POWDERED CREAMER of any kind), and Gatorade    Take these medicines the morning of surgery with A SIP OF WATER:  acetaminophen (TYLENOL) - if needed  As of today, STOP taking any Aspirin (unless otherwise instructed by your surgeon) Aleve, Naproxen, Ibuprofen, Motrin, Advil, Goody's, BC's, all herbal medications, fish oil, and all vitamins.  WHAT DO I DO ABOUT MY DIABETES MEDICATION?   Do not take oral diabetes medicines (pills) the morning of surgery.      THE MORNING OF SURGERY, do not take metFORMIN (GLUCOPHAGE).  The day of surgery, do not take other diabetes injectables, including Byetta (exenatide), Bydureon (exenatide ER), Victoza (liraglutide), or Trulicity (dulaglutide).  If your CBG is greater than 220 mg/dL, you may take  of your sliding scale (correction) dose of insulin.   HOW TO MANAGE YOUR DIABETES BEFORE AND AFTER SURGERY  Why is it important to control my blood sugar before and after surgery? Improving blood sugar levels before and after surgery helps healing and can limit problems. A way of improving blood sugar control is eating a healthy diet by:  Eating less  sugar and carbohydrates  Increasing activity/exercise  Talking with your doctor about reaching your blood sugar goals High blood sugars (greater than 180 mg/dL) can raise your risk of infections and slow your recovery, so you will need to focus on controlling your diabetes during the weeks before surgery. Make sure that the doctor who takes care of your diabetes knows about your planned surgery including the date and location.  How do I manage my blood sugar before surgery? Check your blood sugar at least 4 times a day, starting 2 days before surgery, to make sure that the level is not too high or low.  Check your blood sugar the morning of your surgery when you wake up and every 2 hours until you get to the Short Stay unit.  If your blood sugar is less than 70 mg/dL, you will need to treat for low blood sugar: Do not take insulin. Treat a low blood sugar (less than 70 mg/dL) with  cup of clear juice (cranberry or apple), 4 glucose tablets, OR glucose gel. Recheck blood sugar in 15 minutes after treatment (to make sure it is greater than 70 mg/dL). If your blood sugar is not greater than 70 mg/dL on recheck, call [phone] for further instructions. Report your blood sugar to the short stay nurse when you get to Short Stay.  If you are admitted to the hospital after surgery: Your blood sugar will be checked by the staff and you will probably be given insulin after surgery (instead of oral diabetes medicines) to make sure you  have good blood sugar levels. The goal for blood sugar control after surgery is 80-180 mg/dL.            Do not wear jewelry or makeup. Do not wear lotions, powders, perfumes/cologne or deodorant. Do not shave 48 hours prior to surgery.  Men may shave face and neck. Do not bring valuables to the hospital. Do not wear nail polish, gel polish, artificial nails, or any other type of covering on natural nails (fingers and toes) If you have artificial nails or gel  coating that need to be removed by a nail salon, please have this removed prior to surgery. Artificial nails or gel coating may interfere with anesthesia's ability to adequately monitor your vital signs.  [facility] is not responsible for any belongings or valuables.    Do NOT Smoke (Tobacco/Vaping)  24 hours prior to your procedure  If you use a CPAP at night, you may bring your mask for your overnight stay.   Contacts, glasses, hearing aids, dentures or partials may not be worn into surgery, please bring cases for these belongings   For patients admitted to the hospital, discharge time will be determined by your treatment team.   Patients discharged the day of surgery will not be allowed to drive home, and someone needs to stay with them for 24 hours.   SURGICAL WAITING ROOM VISITATION Patients having surgery or a procedure may have no more than 2 support people in the waiting area - these visitors may rotate.   Children under the age of 83 must have an adult with them who is not the patient. If the patient needs to stay at the hospital during part of their recovery, the visitor guidelines for inpatient rooms apply. Pre-op nurse will coordinate an appropriate time for 1 support person to accompany patient in pre-op.  This support person may not rotate.   Please refer to [email or web address] for the visitor guidelines for Inpatients (after your surgery is over and you are in a regular room).    Special instructions:    Oral Hygiene is also important to reduce your risk of infection.  Remember - BRUSH YOUR TEETH THE MORNING OF SURGERY WITH YOUR REGULAR TOOTHPASTE   [facility]- Preparing For Surgery  Before surgery, you can play an important role. Because skin is not sterile, your skin needs to be as free of germs as possible. You can reduce the number of germs on your skin by washing with CHG (chlorahexidine gluconate) Soap before  surgery.  CHG is an antiseptic cleaner which kills germs and bonds with the skin to continue killing germs even after washing.     Please do not use if you have an allergy to CHG or antibacterial soaps. If your skin becomes reddened/irritated stop using the CHG.  Do not shave (including legs and underarms) for at least 48 hours prior to first CHG shower. It is OK to shave your face.  Please follow these instructions carefully.     Shower the NIGHT BEFORE SURGERY and the MORNING OF SURGERY with CHG Soap.   If you chose to wash your hair, wash your hair first as usual with your normal shampoo. After you shampoo, rinse your hair and body thoroughly to remove the shampoo.  Then [employer] and genitals (private parts) with your normal soap and rinse thoroughly to remove soap.  After that Use CHG Soap as you would any other liquid soap. You can apply CHG directly to the skin and wash  gently with a scrungie or a clean washcloth.   Apply the CHG Soap to your body ONLY FROM THE NECK DOWN.  Do not use on open wounds or open sores. Avoid contact with your eyes, ears, mouth and genitals (private parts). Wash Face and genitals (private parts)  with your normal soap.   Wash thoroughly, paying special attention to the area where your surgery will be performed.  Thoroughly rinse your body with warm water from the neck down.  DO NOT shower/wash with your normal soap after using and rinsing off the CHG Soap.  Pat yourself dry with a CLEAN TOWEL.  Wear CLEAN PAJAMAS to bed the night before surgery  Place CLEAN SHEETS on your bed the night before your surgery  DO NOT SLEEP WITH PETS.   Day of Surgery: Take a shower with CHG soap. Wear Clean/Comfortable clothing the morning of surgery Do not apply any deodorants/lotions.   Remember to brush your teeth WITH YOUR REGULAR TOOTHPASTE.    If you received a COVID test during your pre-op visit, it is requested that you wear a mask when out in public, stay  away from anyone that may not be feeling well, and notify your surgeon if you develop symptoms. If you have been in contact with anyone that has tested positive in the last 10 days, please notify your surgeon.    Please read over the following fact sheets that you were given.

## 2022-07-19 ENCOUNTER — Encounter (HOSPITAL_COMMUNITY)
Admission: RE | Admit: 2022-07-19 | Discharge: 2022-07-19 | Disposition: A | Discharge status: Home / Self Care | Payer: Medicare HMO | Source: Ambulatory Visit | Attending: Thoracic Surgery (Cardiothoracic Vascular Surgery) | Admitting: Thoracic Surgery (Cardiothoracic Vascular Surgery)

## 2022-07-19 ENCOUNTER — Ambulatory Visit (HOSPITAL_COMMUNITY)
Admission: RE | Admit: 2022-07-19 | Discharge: 2022-07-19 | Disposition: A | Discharge status: Home / Self Care | Payer: Medicare HMO | Source: Ambulatory Visit | Attending: Thoracic Surgery (Cardiothoracic Vascular Surgery) | Admitting: Thoracic Surgery (Cardiothoracic Vascular Surgery)

## 2022-07-19 ENCOUNTER — Other Ambulatory Visit (HOSPITAL_COMMUNITY): Payer: Medicare HMO

## 2022-07-19 ENCOUNTER — Encounter (HOSPITAL_COMMUNITY): Payer: Self-pay

## 2022-07-19 ENCOUNTER — Other Ambulatory Visit: Payer: Self-pay

## 2022-07-19 VITALS — BP 127/79 | Temp 98.1°F | Resp 20 | Ht 72.0 in | Wt 318.5 lb

## 2022-07-19 DIAGNOSIS — R590 Localized enlarged lymph nodes: Secondary | ICD-10-CM | POA: Diagnosis not present

## 2022-07-19 DIAGNOSIS — Z1152 Encounter for screening for COVID-19: Secondary | ICD-10-CM | POA: Insufficient documentation

## 2022-07-19 DIAGNOSIS — Z01818 Encounter for other preprocedural examination: Secondary | ICD-10-CM

## 2022-07-19 HISTORY — DX: Chronic obstructive pulmonary disease, unspecified: J44.9

## 2022-07-19 LAB — CBC
HCT: 37.5 % — ABNORMAL LOW (ref 39.0–52.0)
Hemoglobin: 11.8 g/dL — ABNORMAL LOW (ref 13.0–17.0)
MCH: 28.6 pg (ref 26.0–34.0)
MCHC: 31.5 g/dL (ref 30.0–36.0)
MCV: 90.8 fL (ref 80.0–100.0)
Platelets: 353 10*3/uL (ref 150–400)
RBC: 4.13 MIL/uL — ABNORMAL LOW (ref 4.22–5.81)
RDW: 13.8 % (ref 11.5–15.5)
WBC: 6.5 10*3/uL (ref 4.0–10.5)
nRBC: 0 % (ref 0.0–0.2)

## 2022-07-19 LAB — COMPREHENSIVE METABOLIC PANEL
ALT: 11 U/L (ref 0–44)
AST: 16 U/L (ref 15–41)
Albumin: 3.9 g/dL (ref 3.5–5.0)
Alkaline Phosphatase: 60 U/L (ref 38–126)
Anion gap: 7 (ref 5–15)
BUN: 11 mg/dL (ref 8–23)
CO2: 28 mmol/L (ref 22–32)
Calcium: 9.4 mg/dL (ref 8.9–10.3)
Chloride: 104 mmol/L (ref 98–111)
Creatinine, Ser: 1.03 mg/dL (ref 0.61–1.24)
GFR, Estimated: >60 mL/min (ref >60)
Glucose, Bld: 194 mg/dL — ABNORMAL HIGH (ref 70–99)
Potassium: 4.2 mmol/L (ref 3.5–5.1)
Sodium: 139 mmol/L (ref 135–145)
Total Bilirubin: 0.5 mg/dL (ref 0.3–1.2)
Total Protein: 7.5 g/dL (ref 6.5–8.1)

## 2022-07-19 LAB — SURGICAL PCR SCREEN
MRSA, PCR: NEGATIVE
Staphylococcus aureus: NEGATIVE

## 2022-07-19 LAB — APTT: aPTT: 27 seconds (ref 24–36)

## 2022-07-19 LAB — PROTIME-INR
INR: 1 (ref 0.8–1.2)
Prothrombin Time: 13 seconds (ref 11.4–15.2)

## 2022-07-19 LAB — TYPE AND SCREEN
ABO/RH(D): A POS
Antibody Screen: NEGATIVE

## 2022-07-19 LAB — SARS CORONAVIRUS 2 BY RT PCR: SARS Coronavirus 2 by RT PCR: NEGATIVE

## 2022-07-19 NOTE — Progress Notes (Signed)
PCP - Shirline Frees, MD Cardiologist - Jenkins Rouge, MD  PPM/ICD - Denies  Chest x-ray - 07/19/2022 EKG - 07/19/2022 Stress Test - 09/21/2021 ECHO - 09/20/2021 Cardiac Cath - Denies  Sleep Study - Denies  DM: Prediabetic  Patient states he does not check his blood sugar at home.  Last dose of GLP1 agonist- N/A GLP1 instructions: N/A  Blood Thinner Instructions: N/A Aspirin Instructions: Patient states that surgeon instructed him to stop taking aspirin 5-7 days prior to surgery. Patient instructed to call surgeons office to clarify. Patient states his last dose of Aspirin was on 07/16/2022.  ERAS Protcol - Yes PRE-SURGERY Ensure or G2- No drink   COVID TEST- Yes   Anesthesia review: Yes, Cardiac history.   Patient denies shortness of breath, fever, cough and chest pain at PAT appointment   All instructions explained to the patient, with a verbal understanding of the material. Patient agrees to go over the instructions while at home for a better understanding. Patient also instructed to self quarantine after being tested for COVID-19. The opportunity to ask questions was provided.

## 2022-07-19 NOTE — Progress Notes (Signed)
Surgical Instructions    Your procedure is scheduled on Friday , 07/22/22.  Report to [facility] Main Entrance "A" at 9:00 A.M., then check in with the Admitting office.  Call this number if you have problems the morning of surgery:  [phone]   If you have any questions prior to your surgery date call [phone]: Open Monday-Friday 8am-4pm If you experience any cold or flu symptoms such as cough, fever, chills, shortness of breath, etc. between now and your scheduled surgery, please notify us at the above number     Remember:  Do not eat after midnight the night before your surgery  You may drink clear liquids until 8:00 am the morning of your surgery.   Clear liquids allowed are: Water, Non-Citrus Juices (without pulp), Carbonated Beverages, Clear Tea, Black Coffee ONLY (NO MILK, CREAM OR POWDERED CREAMER of any kind), and Gatorade    Take these medicines the morning of surgery with A SIP OF WATER:   acetaminophen (TYLENOL) - if needed  As of today, STOP taking any Aspirin (unless otherwise instructed by your surgeon) Aleve, Naproxen, Ibuprofen, Motrin, Advil, Goody's, BC's, all herbal medications, fish oil, and all vitamins.  WHAT DO I DO ABOUT MY DIABETES MEDICATION?   Do not take oral diabetes medicines (pills) the morning of surgery.      THE MORNING OF SURGERY, do not take metFORMIN (GLUCOPHAGE).  The day of surgery, do not take other diabetes injectables, including Byetta (exenatide), Bydureon (exenatide ER), Victoza (liraglutide), or Trulicity (dulaglutide).  If your CBG is greater than 220 mg/dL, you may take  of your sliding scale (correction) dose of insulin.   HOW TO MANAGE YOUR DIABETES BEFORE AND AFTER SURGERY  Why is it important to control my blood sugar before and after surgery? Improving blood sugar levels before and after surgery helps healing and can limit problems. A way of improving blood sugar control is eating a healthy diet by:  Eating less  sugar and carbohydrates  Increasing activity/exercise  Talking with your doctor about reaching your blood sugar goals High blood sugars (greater than 180 mg/dL) can raise your risk of infections and slow your recovery, so you will need to focus on controlling your diabetes during the weeks before surgery. Make sure that the doctor who takes care of your diabetes knows about your planned surgery including the date and location.  How do I manage my blood sugar before surgery? Check your blood sugar at least 4 times a day, starting 2 days before surgery, to make sure that the level is not too high or low.  Check your blood sugar the morning of your surgery when you wake up and every 2 hours until you get to the Short Stay unit.  If your blood sugar is less than 70 mg/dL, you will need to treat for low blood sugar: Do not take insulin. Treat a low blood sugar (less than 70 mg/dL) with  cup of clear juice (cranberry or apple), 4 glucose tablets, OR glucose gel. Recheck blood sugar in 15 minutes after treatment (to make sure it is greater than 70 mg/dL). If your blood sugar is not greater than 70 mg/dL on recheck, call [phone] for further instructions. Report your blood sugar to the short stay nurse when you get to Short Stay.  If you are admitted to the hospital after surgery: Your blood sugar will be checked by the staff and you will probably be given insulin after surgery (instead of oral diabetes medicines) to  make sure you have good blood sugar levels. The goal for blood sugar control after surgery is 80-180 mg/dL.   Special instructions:    Oral Hygiene is also important to reduce your risk of infection.  Remember - BRUSH YOUR TEETH THE MORNING OF SURGERY WITH YOUR REGULAR TOOTHPASTE   [facility]- Preparing For Surgery  Before surgery, you can play an important role. Because skin is not sterile, your skin needs to be as free of germs as possible. You can reduce the number of germs  on your skin by washing with CHG (chlorahexidine gluconate) Soap before surgery.  CHG is an antiseptic cleaner which kills germs and bonds with the skin to continue killing germs even after washing.     Please do not use if you have an allergy to CHG or antibacterial soaps. If your skin becomes reddened/irritated stop using the CHG.  Do not shave (including legs and underarms) for at least 48 hours prior to first CHG shower. It is OK to shave your face.  Please follow these instructions carefully.     Shower the NIGHT BEFORE SURGERY and the MORNING OF SURGERY with CHG Soap.   If you chose to wash your hair, wash your hair first as usual with your normal shampoo. After you shampoo, rinse your hair and body thoroughly to remove the shampoo.  Then [employer] and genitals (private parts) with your normal soap and rinse thoroughly to remove soap.  After that Use CHG Soap as you would any other liquid soap. You can apply CHG directly to the skin and wash gently with a scrungie or a clean washcloth.   Apply the CHG Soap to your body ONLY FROM THE NECK DOWN.  Do not use on open wounds or open sores. Avoid contact with your eyes, ears, mouth and genitals (private parts). Wash Face and genitals (private parts)  with your normal soap.   Wash thoroughly, paying special attention to the area where your surgery will be performed.  Thoroughly rinse your body with warm water from the neck down.  DO NOT shower/wash with your normal soap after using and rinsing off the CHG Soap.  Pat yourself dry with a CLEAN TOWEL.  Wear CLEAN PAJAMAS to bed the night before surgery  Place CLEAN SHEETS on your bed the night before your surgery  DO NOT SLEEP WITH PETS.   Day of Surgery: Take a shower with CHG soap. Wear Clean/Comfortable clothing the morning of surgery Do not apply any deodorants/lotions.   Remember to brush your teeth WITH YOUR REGULAR TOOTHPASTE.  Do not wear jewelry or makeup. Do not wear  lotions, powders, perfumes/cologne or deodorant. Do not shave 48 hours prior to surgery.  Men may shave face and neck. Do not bring valuables to the hospital. Do not wear nail polish, gel polish, artificial nails, or any other type of covering on natural nails (fingers and toes) If you have artificial nails or gel coating that need to be removed by a nail salon, please have this removed prior to surgery. Artificial nails or gel coating may interfere with anesthesia's ability to adequately monitor your vital signs.  [facility] is not responsible for any belongings or valuables.    Do NOT Smoke (Tobacco/Vaping)  24 hours prior to your procedure  If you use a CPAP at night, you may bring your mask for your overnight stay.   Contacts, glasses, hearing aids, dentures or partials may not be worn into surgery, please bring cases for  these belongings   For patients admitted to the hospital, discharge time will be determined by your treatment team.   Patients discharged the day of surgery will not be allowed to drive home, and someone needs to stay with them for 24 hours.   SURGICAL WAITING ROOM VISITATION Patients having surgery or a procedure may have no more than 2 support people in the waiting area - these visitors may rotate.   Children under the age of 106 must have an adult with them who is not the patient. If the patient needs to stay at the hospital during part of their recovery, the visitor guidelines for inpatient rooms apply. Pre-op nurse will coordinate an appropriate time for 1 support person to accompany patient in pre-op.  This support person may not rotate.   Please refer to [email or web address] for the visitor guidelines for Inpatients (after your surgery is over and you are in a regular room).     If you received a COVID test during your pre-op visit, it is requested that you wear a mask when out in public, stay away from anyone  that may not be feeling well, and notify your surgeon if you develop symptoms. If you have been in contact with anyone that has tested positive in the last 10 days, please notify your surgeon.    Please read over the following fact sheets that you were given.

## 2022-07-20 NOTE — Progress Notes (Signed)
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    Earlier this year he noted right neck mass. 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 PET/CT showed that mass was hypermetabolic and also showed an enlarged hypermetabolic left paratracheal lymph node. He developed hoarseness a couple of weeks ago. He says it has waxed and waned some but has not completely resolved.   Last cardiology visit with Dr. Johnsie Cancel was on 08/27/21 for dizziness. Did not sound like cardiac etiology. He did have bifascicular block, so recommended yearly EKGs. Echo and stress test ordered which were reassuring. As needed cardiology follow-up.   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  Preop labs reviewed, mild anemia with hemoglobin 11.8, glucose elevated 194, otherwise unremarkable.  EKG 07/19/22: Normal sinus rhythm with sinus arrhythmia. Rate 87. Left axis deviation. Right bundle branch block  CT Soft tissue neck 05/05/22: IMPRESSION: 1. Interval development of a 2.3 x 2 cm heterogeneous and apparently centrally necrotic right posterior cervical chain node at approximately the C3-4 level. 2. Interval increased symmetric fullness in the adenoidal eminence, lingual tonsils and palatine tonsils. This could be due to pharyngitis or lymphoproliferative etiology. 3. Encroachment on the posterior nasopharynx due to prominent adenoids, with narrowing of the pharyngeal airway due to the prominent palatine tonsils. 4. Previously there was mild thickening of the right vocal fold. Due to apposition and streak artifact through the larynx, the vocal folds are poorly visualized on  today's but do not show an obvious mass, adjacent cartilage erosion or paraglottic fat infiltration. 5. Degenerative changes of the cervical spine with short pedicles reducing the effective AP spinal canal diameter. 6. Posterior disc osteophyte complexes mildly compressing the cord at C5-6 and C6-7.  Nuclear stress test 09/21/21:   Findings are consistent with no prior ischemia and no prior myocardial infarction. The study is low risk.   No ST deviation was noted.   Left ventricular function is low normal. Nuclear stress EF: 52 %. The left ventricular ejection fraction is mildly decreased (45-54%). End diastolic cavity size is normal.   Prior study not available for comparison.   Echo 09/20/21: IMPRESSIONS   1. Left ventricular ejection fraction, by estimation, is 55 to 60%. The  left ventricle has normal function. The left ventricle has no regional  wall motion abnormalities. There is moderate concentric left ventricular  hypertrophy. Left ventricular  diastolic parameters were normal.   2. Right ventricular systolic function is normal. The right ventricular  size is normal. There is normal pulmonary artery systolic pressure. The  estimated right ventricular systolic pressure is Q000111Q mmHg.   3. The mitral valve is grossly normal. No evidence of mitral valve  regurgitation. No evidence of mitral stenosis.   4. The aortic valve is tricuspid. Aortic valve regurgitation is not  visualized. No aortic stenosis is present.   5. The inferior vena cava is dilated in size with >50% respiratory  variability, suggesting right atrial pressure of 8 mmHg.  - Comparison(s): No significant change from prior study.     Wynonia Musty [facility] Short Stay Center/Anesthesiology Phone [phone] 07/20/2022 10:02 AM

## 2022-07-20 NOTE — Anesthesia Preprocedure Evaluation (Addendum)
Anesthesia Evaluation  Patient identified by MRN, date of birth, ID band Patient awake    Reviewed: Allergy & Precautions, NPO status , Patient's Chart, lab work & pertinent test results  Airway Mallampati: II  TM Distance: >3 FB Neck ROM: Full    Dental no notable dental hx.    Pulmonary COPD, former smoker   Pulmonary exam normal        Cardiovascular hypertension, Pt. on medications + Peripheral Vascular Disease  Normal cardiovascular exam     Neuro/Psych negative neurological ROS  negative psych ROS   GI/Hepatic negative GI ROS,,,(+)     substance abuse    Endo/Other  diabetes, Oral Hypoglycemic Agents  Morbid obesity  Renal/GU negative Renal ROS     Musculoskeletal  (+) Arthritis ,    Abdominal  (+) + obese  Peds  Hematology  (+) Blood dyscrasia, anemia   Anesthesia Other Findings LEFT PARATRACHEAL ADENOPATHY  Reproductive/Obstetrics                             Anesthesia Physical Anesthesia Plan  ASA: 3  Anesthesia Plan: General   Post-op Pain Management:    Induction: Intravenous  PONV Risk Score and Plan: 2 and Ondansetron, Dexamethasone, Midazolam and Treatment may vary due to age or medical condition  Airway Management Planned: Oral ETT  Additional Equipment: Arterial line  Intra-op Plan:   Post-operative Plan: Extubation in OR  Informed Consent: I have reviewed the patients History and Physical, chart, labs and discussed the procedure including the risks, benefits and alternatives for the proposed anesthesia with the patient or authorized representative who has indicated his/her understanding and acceptance.     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   Earlier this year he noted right neck mass. 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 PET/CT showed that mass was hypermetabolic and also showed an enlarged hypermetabolic left paratracheal lymph node. He developed hoarseness a couple of weeks ago. He says it has waxed and waned some but has not completely resolved.   Last cardiology visit with Dr. Johnsie Cancel was on 08/27/21 for dizziness. Did not sound like cardiac etiology. He did have bifascicular block, so recommended yearly EKGs. Echo and stress test ordered which were reassuring. As needed cardiology follow-up.  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  Preop labs reviewed, mild anemia with hemoglobin 11.8, glucose elevated 194, otherwise unremarkable.   EKG 07/19/22: Normal sinus rhythm with sinus arrhythmia. Rate 87. Left axis deviation. Right bundle branch block  CT Soft tissue neck 05/05/22: IMPRESSION: 1. Interval development of a 2.3 x 2 cm heterogeneous and apparently centrally necrotic right posterior cervical chain node at approximately the C3-4 level. 2. Interval increased symmetric fullness in the adenoidal eminence, lingual tonsils and palatine tonsils. This could be due to pharyngitis or lymphoproliferative etiology. 3. Encroachment on the posterior nasopharynx due to prominent adenoids, with narrowing of the pharyngeal airway due to the prominent palatine tonsils. 4. Previously there was mild thickening of the right vocal fold. Due to apposition and streak artifact through the larynx, the vocal folds are poorly visualized on  today's but do not show an obvious mass, adjacent cartilage erosion or paraglottic fat infiltration. 5. Degenerative changes of the cervical spine with short  pedicles reducing the effective AP spinal canal diameter. 6. Posterior disc osteophyte complexes mildly compressing the cord at C5-6 and C6-7.  Nuclear stress test 09/21/21:  Findings are consistent with no prior ischemia and no prior myocardial infarction. The study is low risk.  No ST deviation was noted.  Left ventricular function is low normal. Nuclear stress EF: 52 %. The left ventricular ejection fraction is mildly decreased (45-54%). End diastolic cavity size is normal.  Prior study not available for comparison.  Echo 09/20/21: IMPRESSIONS  1. Left ventricular ejection fraction, by estimation, is 55 to 60%. The  left ventricle has normal function. The left ventricle has no regional  wall motion abnormalities. There is moderate concentric left ventricular  hypertrophy. Left ventricular  diastolic parameters were normal.  2. Right ventricular systolic function is normal. The right ventricular  size is normal. There is normal pulmonary artery systolic pressure. The  estimated right ventricular systolic pressure is Q000111Q mmHg.  3. The mitral valve is grossly normal. No evidence of mitral valve  regurgitation. No evidence of mitral stenosis.  4. The aortic valve is tricuspid. Aortic valve regurgitation is not  visualized. No aortic stenosis is present.  5. The inferior vena cava is dilated in size with >50% respiratory  variability, suggesting right atrial pressure of 8 mmHg.  -Comparison(s): No significant change from prior study.    )        Anesthesia Quick Evaluation

## 2022-07-22 ENCOUNTER — Other Ambulatory Visit: Payer: Self-pay

## 2022-07-22 ENCOUNTER — Encounter (HOSPITAL_COMMUNITY)
Admission: RE | Disposition: A | Discharge status: Home / Self Care | Payer: Self-pay | Source: Home / Self Care | Attending: Thoracic Surgery (Cardiothoracic Vascular Surgery)

## 2022-07-22 ENCOUNTER — Ambulatory Visit (HOSPITAL_COMMUNITY): Payer: Medicare HMO | Admitting: Physician Assistant

## 2022-07-22 ENCOUNTER — Ambulatory Visit (HOSPITAL_COMMUNITY)
Admission: RE | Admit: 2022-07-22 | Discharge: 2022-07-22 | Disposition: A | Discharge status: Home / Self Care | Payer: Medicare HMO | Attending: Thoracic Surgery (Cardiothoracic Vascular Surgery) | Admitting: Thoracic Surgery (Cardiothoracic Vascular Surgery)

## 2022-07-22 ENCOUNTER — Ambulatory Visit (HOSPITAL_BASED_OUTPATIENT_CLINIC_OR_DEPARTMENT_OTHER): Payer: Medicare HMO | Admitting: Physician Assistant

## 2022-07-22 ENCOUNTER — Encounter (HOSPITAL_COMMUNITY): Payer: Self-pay | Admitting: Thoracic Surgery (Cardiothoracic Vascular Surgery)

## 2022-07-22 DIAGNOSIS — R590 Localized enlarged lymph nodes: Secondary | ICD-10-CM | POA: Diagnosis present

## 2022-07-22 DIAGNOSIS — E1151 Type 2 diabetes mellitus with diabetic peripheral angiopathy without gangrene: Secondary | ICD-10-CM | POA: Diagnosis not present

## 2022-07-22 DIAGNOSIS — Z800 Family history of malignant neoplasm of digestive organs: Secondary | ICD-10-CM | POA: Diagnosis not present

## 2022-07-22 DIAGNOSIS — C7839 Secondary malignant neoplasm of other respiratory organs: Secondary | ICD-10-CM | POA: Insufficient documentation

## 2022-07-22 DIAGNOSIS — Z87891 Personal history of nicotine dependence: Secondary | ICD-10-CM | POA: Diagnosis not present

## 2022-07-22 DIAGNOSIS — Z8571 Personal history of Hodgkin lymphoma: Secondary | ICD-10-CM | POA: Diagnosis not present

## 2022-07-22 DIAGNOSIS — C329 Malignant neoplasm of larynx, unspecified: Secondary | ICD-10-CM | POA: Diagnosis not present

## 2022-07-22 DIAGNOSIS — Z8521 Personal history of malignant neoplasm of larynx: Secondary | ICD-10-CM | POA: Insufficient documentation

## 2022-07-22 DIAGNOSIS — Z803 Family history of malignant neoplasm of breast: Secondary | ICD-10-CM | POA: Insufficient documentation

## 2022-07-22 DIAGNOSIS — C33 Malignant neoplasm of trachea: Secondary | ICD-10-CM | POA: Diagnosis not present

## 2022-07-22 DIAGNOSIS — J449 Chronic obstructive pulmonary disease, unspecified: Secondary | ICD-10-CM

## 2022-07-22 DIAGNOSIS — E785 Hyperlipidemia, unspecified: Secondary | ICD-10-CM | POA: Insufficient documentation

## 2022-07-22 DIAGNOSIS — Z833 Family history of diabetes mellitus: Secondary | ICD-10-CM | POA: Insufficient documentation

## 2022-07-22 DIAGNOSIS — Z7984 Long term (current) use of oral hypoglycemic drugs: Secondary | ICD-10-CM

## 2022-07-22 DIAGNOSIS — I10 Essential (primary) hypertension: Secondary | ICD-10-CM | POA: Insufficient documentation

## 2022-07-22 DIAGNOSIS — Z6841 Body Mass Index (BMI) 40.0 and over, adult: Secondary | ICD-10-CM | POA: Insufficient documentation

## 2022-07-22 DIAGNOSIS — D630 Anemia in neoplastic disease: Secondary | ICD-10-CM

## 2022-07-22 DIAGNOSIS — Z8249 Family history of ischemic heart disease and other diseases of the circulatory system: Secondary | ICD-10-CM | POA: Diagnosis not present

## 2022-07-22 DIAGNOSIS — E119 Type 2 diabetes mellitus without complications: Secondary | ICD-10-CM | POA: Diagnosis not present

## 2022-07-22 HISTORY — PX: MEDIASTINOSCOPY: SHX5086

## 2022-07-22 LAB — GLUCOSE, CAPILLARY
Glucose-Capillary: 180 mg/dL — ABNORMAL HIGH (ref 70–99)
Glucose-Capillary: 166 mg/dL — ABNORMAL HIGH (ref 70–99)
Glucose-Capillary: 148 mg/dL — ABNORMAL HIGH (ref 70–99)
Glucose-Capillary: 152 mg/dL — ABNORMAL HIGH (ref 70–99)

## 2022-07-22 SURGERY — MEDIASTINOSCOPY
Anesthesia: General

## 2022-07-22 MED ORDER — PROPOFOL 10 MG/ML IV BOLUS
INTRAVENOUS | Status: AC
  Filled 2022-07-22: qty 20

## 2022-07-22 MED ORDER — DEXAMETHASONE SODIUM PHOSPHATE 10 MG/ML IJ SOLN
INTRAMUSCULAR | Status: DC | PRN
  Administered 2022-07-22: 5 mg via INTRAVENOUS

## 2022-07-22 MED ORDER — ACETAMINOPHEN 500 MG PO TABS
1000.0000 mg | ORAL_TABLET | Freq: Once | ORAL | Status: AC
  Administered 2022-07-22: 1000 mg via ORAL
  Filled 2022-07-22: qty 2

## 2022-07-22 MED ORDER — SUCCINYLCHOLINE CHLORIDE 200 MG/10ML IV SOSY
PREFILLED_SYRINGE | INTRAVENOUS | Status: AC
  Filled 2022-07-22: qty 10

## 2022-07-22 MED ORDER — INSULIN ASPART 100 UNIT/ML IJ SOLN
0.0000 [IU] | INTRAMUSCULAR | Status: AC | PRN
  Administered 2022-07-22: 2 [IU] via SUBCUTANEOUS
  Filled 2022-07-22: qty 1

## 2022-07-22 MED ORDER — EPINEPHRINE PF 1 MG/ML IJ SOLN
INTRAMUSCULAR | Status: AC
  Filled 2022-07-22: qty 1

## 2022-07-22 MED ORDER — FENTANYL CITRATE (PF) 250 MCG/5ML IJ SOLN
INTRAMUSCULAR | Status: DC | PRN
  Administered 2022-07-22: 50 ug via INTRAVENOUS
  Administered 2022-07-22: 150 ug via INTRAVENOUS
  Administered 2022-07-22: 50 ug via INTRAVENOUS

## 2022-07-22 MED ORDER — MIDAZOLAM HCL 2 MG/2ML IJ SOLN
INTRAMUSCULAR | Status: DC | PRN
  Administered 2022-07-22: 2 mg via INTRAVENOUS

## 2022-07-22 MED ORDER — OXYCODONE HCL 5 MG PO TABS: 5.0000 mg | ORAL_TABLET | Freq: Four times a day (QID) | ORAL | 0 refills | Status: DC | PRN

## 2022-07-22 MED ORDER — PHENYLEPHRINE 80 MCG/ML (10ML) SYRINGE FOR IV PUSH (FOR BLOOD PRESSURE SUPPORT)
PREFILLED_SYRINGE | INTRAVENOUS | Status: AC
  Filled 2022-07-22: qty 10

## 2022-07-22 MED ORDER — LACTATED RINGERS IV SOLN: INTRAVENOUS | Status: DC

## 2022-07-22 MED ORDER — LACTATED RINGERS IV SOLN: INTRAVENOUS | Status: DC | PRN

## 2022-07-22 MED ORDER — MIDAZOLAM HCL 2 MG/2ML IJ SOLN
INTRAMUSCULAR | Status: AC
  Filled 2022-07-22: qty 2

## 2022-07-22 MED ORDER — SUCCINYLCHOLINE CHLORIDE 200 MG/10ML IV SOSY
PREFILLED_SYRINGE | INTRAVENOUS | Status: DC | PRN
  Administered 2022-07-22: 140 mg via INTRAVENOUS

## 2022-07-22 MED ORDER — CHLORHEXIDINE GLUCONATE 0.12 % MT SOLN
15.0000 mL | Freq: Once | OROMUCOSAL | Status: AC
  Administered 2022-07-22: 15 mL via OROMUCOSAL
  Filled 2022-07-22: qty 15

## 2022-07-22 MED ORDER — LIDOCAINE 2% (20 MG/ML) 5 ML SYRINGE
INTRAMUSCULAR | Status: AC
  Filled 2022-07-22: qty 5

## 2022-07-22 MED ORDER — LIDOCAINE 2% (20 MG/ML) 5 ML SYRINGE
INTRAMUSCULAR | Status: DC | PRN
  Administered 2022-07-22: 60 mg via INTRAVENOUS

## 2022-07-22 MED ORDER — INSULIN ASPART 100 UNIT/ML IJ SOLN
INTRAMUSCULAR | Status: DC | PRN
  Administered 2022-07-22: 2 [IU] via SUBCUTANEOUS

## 2022-07-22 MED ORDER — PROPOFOL 10 MG/ML IV BOLUS
INTRAVENOUS | Status: DC | PRN
  Administered 2022-07-22: 200 mg via INTRAVENOUS
  Administered 2022-07-22: 30 mg via INTRAVENOUS

## 2022-07-22 MED ORDER — INSULIN ASPART 100 UNIT/ML IJ SOLN
INTRAMUSCULAR | Status: AC
  Administered 2022-07-22: 4 [IU] via SUBCUTANEOUS
  Filled 2022-07-22: qty 1

## 2022-07-22 MED ORDER — DEXAMETHASONE SODIUM PHOSPHATE 10 MG/ML IJ SOLN
INTRAMUSCULAR | Status: AC
  Filled 2022-07-22: qty 1

## 2022-07-22 MED ORDER — CEFAZOLIN IN SODIUM CHLORIDE 3-0.9 GM/100ML-% IV SOLN
3.0000 g | INTRAVENOUS | Status: AC
  Administered 2022-07-22: 3 g via INTRAVENOUS
  Filled 2022-07-22: qty 100

## 2022-07-22 MED ORDER — PHENYLEPHRINE HCL-NACL 20-0.9 MG/250ML-% IV SOLN
INTRAVENOUS | Status: DC | PRN
  Administered 2022-07-22: 25 ug/min via INTRAVENOUS

## 2022-07-22 MED ORDER — ONDANSETRON HCL 4 MG/2ML IJ SOLN: 4.0000 mg | Freq: Once | INTRAMUSCULAR | Status: DC | PRN

## 2022-07-22 MED ORDER — SUGAMMADEX SODIUM 200 MG/2ML IV SOLN
INTRAVENOUS | Status: DC | PRN
  Administered 2022-07-22: 300 mg via INTRAVENOUS

## 2022-07-22 MED ORDER — ORAL CARE MOUTH RINSE: 15.0000 mL | Freq: Once | OROMUCOSAL | Status: AC

## 2022-07-22 MED ORDER — OXYCODONE HCL 5 MG PO TABS: 5.0000 mg | ORAL_TABLET | Freq: Four times a day (QID) | ORAL | Status: DC | PRN

## 2022-07-22 MED ORDER — ROCURONIUM BROMIDE 10 MG/ML (PF) SYRINGE
PREFILLED_SYRINGE | INTRAVENOUS | Status: AC
  Filled 2022-07-22: qty 10

## 2022-07-22 MED ORDER — HEMOSTATIC AGENTS (NO CHARGE) OPTIME
TOPICAL | Status: DC | PRN
  Administered 2022-07-22 (×2): 1 via TOPICAL

## 2022-07-22 MED ORDER — PHENYLEPHRINE 80 MCG/ML (10ML) SYRINGE FOR IV PUSH (FOR BLOOD PRESSURE SUPPORT)
PREFILLED_SYRINGE | INTRAVENOUS | Status: DC | PRN
  Administered 2022-07-22 (×2): 80 ug via INTRAVENOUS

## 2022-07-22 MED ORDER — ONDANSETRON HCL 4 MG/2ML IJ SOLN
INTRAMUSCULAR | Status: AC
  Filled 2022-07-22: qty 2

## 2022-07-22 MED ORDER — FENTANYL CITRATE (PF) 100 MCG/2ML IJ SOLN: 25.0000 ug | INTRAMUSCULAR | Status: DC | PRN

## 2022-07-22 MED ORDER — AMISULPRIDE (ANTIEMETIC) 5 MG/2ML IV SOLN: 10.0000 mg | Freq: Once | INTRAVENOUS | Status: DC | PRN

## 2022-07-22 MED ORDER — FENTANYL CITRATE (PF) 250 MCG/5ML IJ SOLN
INTRAMUSCULAR | Status: AC
  Filled 2022-07-22: qty 5

## 2022-07-22 MED ORDER — ROCURONIUM BROMIDE 10 MG/ML (PF) SYRINGE
PREFILLED_SYRINGE | INTRAVENOUS | Status: DC | PRN
  Administered 2022-07-22: 50 mg via INTRAVENOUS
  Administered 2022-07-22: 20 mg via INTRAVENOUS

## 2022-07-22 MED ORDER — 0.9 % SODIUM CHLORIDE (POUR BTL) OPTIME
TOPICAL | Status: DC | PRN
  Administered 2022-07-22: 1000 mL

## 2022-07-22 SURGICAL SUPPLY — 50 items
ADH SKN CLS APL DERMABOND .7 (GAUZE/BANDAGES/DRESSINGS) ×1
APPLIER CLIP LOGIC TI 5 (MISCELLANEOUS) IMPLANT
APR CLP MED LRG 33X5 (MISCELLANEOUS) ×1
BLADE CLIPPER SURG (BLADE) IMPLANT
BLADE SURG 15 STRL LF DISP TIS (BLADE) ×1 IMPLANT
BLADE SURG 15 STRL SS (BLADE) ×2
BLANKET WARM LOWER BOD BAIR (MISCELLANEOUS) IMPLANT
CANISTER SUCT 3000ML PPV (MISCELLANEOUS) ×1 IMPLANT
CLIP TI MEDIUM 6 (CLIP) ×1 IMPLANT
CNTNR URN SCR LID CUP LEK RST (MISCELLANEOUS) ×2 IMPLANT
CONT SPEC 4OZ STRL OR WHT (MISCELLANEOUS) ×2
COUNTER NDL 20CT MAGNET RED (NEEDLE) IMPLANT
COVER SURGICAL LIGHT HANDLE (MISCELLANEOUS) ×2 IMPLANT
DERMABOND ADVANCED .7 DNX12 (GAUZE/BANDAGES/DRESSINGS) ×1 IMPLANT
DRAPE CHEST BREAST 15X10 FENES (DRAPES) ×1 IMPLANT
ELECT REM PT RETURN 9FT ADLT (ELECTROSURGICAL) ×1
ELECTRODE REM PT RTRN 9FT ADLT (ELECTROSURGICAL) ×1 IMPLANT
FILTER STRAW FLUID ASPIR (MISCELLANEOUS) IMPLANT
GAUZE 4X4 16PLY ~~LOC~~+RFID DBL (SPONGE) ×2 IMPLANT
GLOVE BIOGEL PI IND STRL 7.5 (GLOVE) IMPLANT
GLOVE SS BIOGEL STRL SZ 7.5 (GLOVE) ×1 IMPLANT
GLOVE SURG MICRO LTX SZ7.5 (GLOVE) ×1 IMPLANT
GLOVE SURG SS PI 6.5 STRL IVOR (GLOVE) IMPLANT
GOWN STRL REUS W/ TWL LRG LVL3 (GOWN DISPOSABLE) ×1 IMPLANT
GOWN STRL REUS W/ TWL XL LVL3 (GOWN DISPOSABLE) ×1 IMPLANT
GOWN STRL REUS W/TWL LRG LVL3 (GOWN DISPOSABLE) ×2
GOWN STRL REUS W/TWL XL LVL3 (GOWN DISPOSABLE) ×2
HEMOSTAT SURGICEL 2X14 (HEMOSTASIS) IMPLANT
KIT BASIN OR (CUSTOM PROCEDURE TRAY) ×1 IMPLANT
KIT TURNOVER KIT B (KITS) ×1 IMPLANT
NS IRRIG 1000ML POUR BTL (IV SOLUTION) ×1 IMPLANT
PACK GENERAL/GYN (CUSTOM PROCEDURE TRAY) ×1 IMPLANT
PAD ARMBOARD 7.5X6 YLW CONV (MISCELLANEOUS) ×2 IMPLANT
PENCIL BUTTON HOLSTER BLD 10FT (ELECTRODE) ×1 IMPLANT
SLEEVE SCD COMPRESS KNEE MED (STOCKING) IMPLANT
SPONGE INTESTINAL PEANUT (DISPOSABLE) IMPLANT
SPONGE T-LAP 18X18 ~~LOC~~+RFID (SPONGE) ×4 IMPLANT
SPONGE T-LAP 4X18 ~~LOC~~+RFID (SPONGE) ×1 IMPLANT
SUT SILK 2 0 (SUTURE)
SUT SILK 2-0 18XBRD TIE 12 (SUTURE) IMPLANT
SUT VIC AB 2-0 CT1 27 (SUTURE)
SUT VIC AB 2-0 CT1 TAPERPNT 27 (SUTURE) IMPLANT
SUT VIC AB 3-0 SH 27 (SUTURE) ×1
SUT VIC AB 3-0 SH 27XBRD (SUTURE) ×1 IMPLANT
SUT VICRYL 4-0 PS2 18IN ABS (SUTURE) ×1 IMPLANT
SYR 10ML LL (SYRINGE) ×1 IMPLANT
SYR TB 1ML LUER SLIP (SYRINGE) IMPLANT
TOWEL GREEN STERILE (TOWEL DISPOSABLE) ×1 IMPLANT
TOWEL GREEN STERILE FF (TOWEL DISPOSABLE) ×1 IMPLANT
WATER STERILE IRR 1000ML POUR (IV SOLUTION) ×1 IMPLANT

## 2022-07-22 NOTE — Transfer of Care (Signed)
Immediate Anesthesia Transfer of Care Note  Patient: Jesus [name]  Procedure(s) Performed: MEDIASTINOSCOPY  Patient Location: PACU  Anesthesia Type:General  Level of Consciousness: awake and patient cooperative  Airway & Oxygen Therapy: Patient Spontanous Breathing and Patient connected to face mask oxygen  Post-op Assessment: Report given to RN and Post -op Vital signs reviewed and stable  Post vital signs: Reviewed and stable  Last Vitals:  Vitals Value Taken Time  BP 147/83 07/22/22 1447  Temp 36.6 C 07/22/22 1445  Pulse 83 07/22/22 1451  Resp 23 07/22/22 1451  SpO2 93 % 07/22/22 1451  Vitals shown include unvalidated device data.  Last Pain:  Vitals:   07/22/22 0912  PainSc: 3          Complications: No notable events documented.

## 2022-07-22 NOTE — Brief Op Note (Signed)
07/22/2022  2:32 PM  PATIENT:  Jesus [name]  67 y.o. male  PRE-OPERATIVE DIAGNOSIS:  LEFT PARATRACHEAL ADENOPATHY  POST-OPERATIVE DIAGNOSIS:  LEFT PARATRACHEAL ADENOPATHY- SQUAMOUS CELL CARCINOMA  PROCEDURE:  Procedure(s): MEDIASTINOSCOPY (N/A)  SURGEON:  Surgeon(s) and Role:    * Melrose Nakayama, MD - Primary  PHYSICIAN ASSISTANT:   ASSISTANTS: Lucas Mallow, MD   ANESTHESIA:   general  EBL:  minimal   BLOOD ADMINISTERED:none  DRAINS: none   LOCAL MEDICATIONS USED:  NONE  SPECIMEN:  Source of Specimen:  left paratracheal mass  DISPOSITION OF SPECIMEN:  PATHOLOGY  COUNTS:  YES  TOURNIQUET:  * No tourniquets in log *  DICTATION: .Other Dictation: Dictation Number -  PLAN OF CARE: Discharge to home after PACU  PATIENT DISPOSITION:  PACU - hemodynamically stable.   Delay start of Pharmacological VTE agent (>24hrs) due to surgical blood loss or risk of bleeding: not applicable

## 2022-07-22 NOTE — Interval H&P Note (Signed)
History and Physical Interval Note:  07/22/2022 12:42 PM  Jesus [name]  has presented today for surgery, with the diagnosis of LEFT PARATRACHEAL ADENOPATHY.  The various methods of treatment have been discussed with the patient and family. After consideration of risks, benefits and other options for treatment, the patient has consented to  Procedure(s): MEDIASTINOSCOPY (N/A) as a surgical intervention.  The patient's history has been reviewed, patient examined, no change in status, stable for surgery.  I have reviewed the patient's chart and labs.  Questions were answered to the patient's satisfaction.     Melrose Nakayama

## 2022-07-22 NOTE — Anesthesia Procedure Notes (Signed)
Procedure Name: Intubation Date/Time: 07/22/2022 1:00 PM  Performed by: Gaylene Brooks, CRNAPre-anesthesia Checklist: Patient identified, Emergency Drugs available, Suction available and Patient being monitored Patient Re-evaluated:Patient Re-evaluated prior to induction Oxygen Delivery Method: Circle System Utilized Preoxygenation: Pre-oxygenation with 100% oxygen Induction Type: IV induction and Rapid sequence Laryngoscope Size: [name] and 2 Grade View: Grade I Tube type: Oral Tube size: 8.0 mm Number of attempts: 1 Airway Equipment and Method: Stylet Placement Confirmation: ETT inserted through vocal cords under direct vision, positive ETCO2 and breath sounds checked- equal and bilateral Secured at: 23 cm Tube secured with: Tape Dental Injury: Teeth and Oropharynx as per pre-operative assessment

## 2022-07-22 NOTE — Op Note (Signed)
 NAME: Jesus [name], ZEHNER MEDICAL RECORD NO: [id number] ACCOUNT NO: [id number] DATE OF BIRTH: [birth year] FACILITY: [facility] LOCATION: [facility] PHYSICIAN: Milon Aloe. Luna Salinas, MD  Operative Report   DATE OF PROCEDURE: 07/22/2022  PREOPERATIVE DIAGNOSIS:  Left paratracheal adenopathy.  POSTOPERATIVE DIAGNOSIS:  Metastatic squamous cell carcinoma.  PROCEDURE:  Mediastinoscopy.  SURGEON:  Milon Aloe. Luna Salinas, MD  ASSISTANT:  Gerianne Kobs, MD  ANESTHESIA:  General.  FINDINGS:  Dense adhesions.  Mass on left side of the trachea with displacement of the trachea to the right.  Biopsy showed squamous cell carcinoma.  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  He has been treated for both of those cancers previously. Earlier this year, he noticed a right neck mass.  Biopsy showed metastatic squamous cell carcinoma.  On PET/CT, there was also an enlarged left paratracheal lymph node.  There was concern whether this might be lymphoma versus metastatic squamous cell carcinoma and he was referred for a biopsy.  The patient was offered the option of mediastinoscopy.  Indications, risks, benefits, and alternatives were discussed in detail with the patient.  He understood and accepted the risks and agreed to proceed.  DESCRIPTION OF PROCEDURE: Jesus [name] was brought to the preoperative holding area on 07/22/2022.  Anesthesia established intravenous access.  He was taken to the operating room and anesthetized and intubated.  Sequential compression devices were placed on the calves for DVT prophylaxis.  A Bair Hugger was placed for active warming.  A shoulder roll was placed and the neck and chest were prepped and draped in the usual sterile fashion.  Timeout was performed.  An incision was made 1 fingerbreadths above the sternal notch.  It was carried through the skin and subcutaneous tissue. The patient had a very thick neck.  The  fatty tissue was dissected down to the level of strap muscles, which were then separated in the midline. The pretracheal fascia was difficult to identify due to fatty tissue, but it was incised and the pretracheal plane was developed bluntly into the mediastinum.  Mediastinoscope was inserted and systematic inspection was made  along the left side of the trachea.  The paratracheal mass was very superior and the trachea was displaced to the right.  There was dense fibrous tissue around the mass, which was dissected off with some difficulty and care was taken to avoid unnecessary use of cautery in that area.  There was a large vein along the trachea that bled and required a clip placement.  Biopsies were taken from the mass and sent for frozen section.  While awaiting those results, multiple additional biopsies were taken for permanent pathology.  The wound then was packed with gauze.  After 5 minutes, the packing was removed.  The mediastinoscope was reinserted and a final inspection was made for hemostasis.  There was no ongoing bleeding.  Frozen  section returned showing squamous cell carcinoma.  The remainder of the specimens were sent for permanent pathology.  The wound was closed in 2 layers.  Dermabond was applied.  The patient was extubated in the operating room and taken to the postanesthetic care unit in good condition.  All sponge, needle, and instrument counts were correct at the end of the procedure. PUS D: 07/22/2022 2:49:21 pm T: 07/22/2022 3:30:00 pm  JOB: [id number]/ [id number]

## 2022-07-22 NOTE — Discharge Instructions (Addendum)
Do not drive or engage in heavy physical activity until 24 hours after surgery.  You may shower tomorrow.  There is a medical adhesive over the incision.  It will begin to peel off in about 2 weeks.   You may use acetaminophen (Tylenol) up to 1000 mg (2 extra strength tablets) up to 4 times a day as needed.  You may also use ibuprofen (Advil, Motrin) if needed.  You have a prescription for oxycodone, a narcotic pain reliever that you may use as directed when Tylenol +/- Advil is not sufficient.  Call [phone] if you develop chest pain, shortness of breath, fever > 101 F or notice excessive pain, swelling, tenderness, redness or drainage from the incision.  My office will contact you with a follow up appointment.  Follow up as scheduled with Dr. Isidore Moos and Irene Limbo

## 2022-07-22 NOTE — Anesthesia Procedure Notes (Signed)
Arterial Line Insertion Start/End3/15/2024 10:10 AM, 07/22/2022 10:25 AM Performed by: Gaylene Brooks, CRNA  Preanesthetic checklist: patient identified, IV checked, site marked, risks and benefits discussed, surgical consent, monitors and equipment checked, pre-op evaluation, timeout performed and anesthesia consent Left, radial was placed Catheter size: 20 G Hand hygiene performed  and maximum sterile barriers used   Attempts: 2 Procedure performed without using ultrasound guided technique. Following insertion, dressing applied and Biopatch. Post procedure assessment: normal  Patient tolerated the procedure well with no immediate complications.

## 2022-07-23 NOTE — Anesthesia Postprocedure Evaluation (Signed)
Anesthesia Post Note  Patient: Jesus [name]  Procedure(s) Performed: MEDIASTINOSCOPY     Patient location during evaluation: PACU Anesthesia Type: General Level of consciousness: awake Pain management: pain level controlled Vital Signs Assessment: post-procedure vital signs reviewed and stable Respiratory status: spontaneous breathing, nonlabored ventilation and respiratory function stable Cardiovascular status: blood pressure returned to baseline and stable Postop Assessment: no apparent nausea or vomiting Anesthetic complications: no   No notable events documented.  Last Vitals:  Vitals:   07/22/22 1500 07/22/22 1515  BP: (!) 154/91 (!) 155/90  Pulse: 80 76  Resp: 17 (!) 25  Temp:  36.6 C  SpO2: 92% 96%    Last Pain:  Vitals:   07/22/22 0912  PainSc: 3                  Adin Lariccia P Devyne Hauger

## 2022-07-25 ENCOUNTER — Encounter (HOSPITAL_COMMUNITY): Payer: Self-pay | Admitting: Thoracic Surgery (Cardiothoracic Vascular Surgery)

## 2022-07-25 ENCOUNTER — Telehealth: Payer: Self-pay | Admitting: *Deleted

## 2022-07-25 NOTE — Telephone Encounter (Signed)
Contacted patient as he called the answering service over the weekend regarding neck swelling. Per patient, on call MD returned patients call promptly. Patient states provider reassured him that swelling was normal post surgery. Patient states swelling as remained the same and incision is clean and dry. Advised patient to contact the office with further concerns. Patient verbalized understanding.

## 2022-07-28 ENCOUNTER — Inpatient Hospital Stay: Payer: Medicare HMO | Attending: Hematology | Admitting: Hematology

## 2022-07-28 DIAGNOSIS — C7951 Secondary malignant neoplasm of bone: Secondary | ICD-10-CM | POA: Diagnosis not present

## 2022-07-28 DIAGNOSIS — Z8572 Personal history of non-Hodgkin lymphomas: Secondary | ICD-10-CM | POA: Insufficient documentation

## 2022-07-28 DIAGNOSIS — Z7189 Other specified counseling: Secondary | ICD-10-CM

## 2022-07-28 DIAGNOSIS — Z803 Family history of malignant neoplasm of breast: Secondary | ICD-10-CM | POA: Insufficient documentation

## 2022-07-28 DIAGNOSIS — Z800 Family history of malignant neoplasm of digestive organs: Secondary | ICD-10-CM | POA: Diagnosis not present

## 2022-07-28 DIAGNOSIS — Z87891 Personal history of nicotine dependence: Secondary | ICD-10-CM | POA: Diagnosis not present

## 2022-07-28 DIAGNOSIS — C760 Malignant neoplasm of head, face and neck: Secondary | ICD-10-CM | POA: Diagnosis not present

## 2022-07-28 DIAGNOSIS — C329 Malignant neoplasm of larynx, unspecified: Secondary | ICD-10-CM | POA: Insufficient documentation

## 2022-07-28 LAB — SURGICAL PATHOLOGY

## 2022-07-28 NOTE — Progress Notes (Addendum)
START ON PATHWAY REGIMEN - Head and Neck     A cycle is every 21 days:     Carboplatin      Fluorouracil      Pembrolizumab   **Always confirm dose/schedule in your pharmacy ordering system**  Patient Characteristics: Larynx, Metastatic, First Line, No Prior Platinum-Based Chemoradiation within 6 Months, PD-L1 Expression Negative (CPS < 1) / Unknown Disease Classification: Larynx AJCC T Category: TX AJCC 8 Stage Grouping: IVC Therapeutic Status: Preoperative or Nonsurgical Candidate AJCC N Category: pN2 AJCC M Category: M1 Line of Therapy: First Line Prior Platinum Status: No Prior Platinum-Based Chemoradiation within 6 Months PD-L1 Expression Status: Awaiting Test Results Intent of Therapy: Non-Curative / Palliative Intent, Discussed with Patient

## 2022-07-28 NOTE — Progress Notes (Deleted)
OFF PATHWAY REGIMEN - Lymphoma and CLL  No Change  Continue With Treatment as Ordered.  Original Decision Date/Time: 01/12/2018 12:20   [id number] q21 Days:   A cycle is every 21 days:     Prednisone      Rituximab      Etoposide      Doxorubicin      Vincristine      Cyclophosphamide      Filgrastim-xxxx   **Always confirm dose/schedule in your pharmacy ordering system**  Patient Characteristics: Diffuse Large B-Cell Lymphoma, First Line, Stage III and IV Disease Type: Not Applicable Disease Type: Diffuse Large B-Cell Lymphoma Disease Type: Not Applicable Line of therapy: First Line [place] Stage: IV Intent of Therapy: Curative Intent, Discussed with Patient

## 2022-07-29 ENCOUNTER — Encounter: Payer: Self-pay | Admitting: Thoracic Surgery (Cardiothoracic Vascular Surgery)

## 2022-08-03 NOTE — Progress Notes (Incomplete)
[place]   Telephone:([phone]) [phone] Fax:([phone]) [place] Note   Date of Service:  08/03/22   Patient Care Team: Shirline Frees, MD as PCP - General (Family Medicine) Josue Hector, MD as PCP - Cardiology (Cardiology) Eppie Gibson, MD as Consulting Physician (Radiation Oncology) Malmfelt, Stephani Police, RN as Oncology Nurse Navigator Jerrell Belfast, MD as Consulting Physician (Otolaryngology) Brunetta Genera, MD as Consulting Physician (Hematology)   Date of Service:  08/03/2022  CHIEF COMPLAINTS/PURPOSE OF CONSULTATION:  Continued evaluation and management of newly noted metastatic laryngeal squamous cell carcinoma.  HISTORY OF PRESENTING ILLNESS:   Jesus [name] 67 y.o. male is here because of left lower extremity edema and lymphadenopathy.  The patient was seen in the emergency room this past Friday for the same issue.  A CT of the abdomen and pelvis was performed showing bulky left inguinal, left hemipelvic, and retroperitoneal adenopathy.  He was referred to [place] from the emergency room for further evaluation.  Doppler ultrasound of the left lower extremity was performed and was negative for DVT. Patient reports that he has been having left lower extremity edema in his left groin and left leg for approximately 1 month.  He states that the swelling in the left groin started to get better but then worsened.  The left lower extremity edema has slowly worsened over time.  Patient denies having fevers and chills.  He reports that he does have night sweats at times.  He reported having headaches approximate 1 [place]  He thinks his headaches are related to not having his blood pressure medication.  His headaches have now resolved.  He denies visual changes.  The patient denies chest pain, shortness of breath and cough.  No nausea, vomiting, constipation, diarrhea.  Denies abdominal pain.  The patient denies recent  weight loss and has actually gained weight recently.  Patient denies epistaxis, bleeding gums, hemoptysis, hematuria, but occasionally, and melena.  He reports increased urinary frequency over the past month but no dysuria.  The patient is here for evaluation and discussion of his recent CT and lab findings.  Interval History:  Marland KitchenMarland KitchenI connected with Jesus [name] on 07/28/2022 at  3:30 PM EDT by telephone visit and verified that I am speaking with the correct person using two identifiers.   I discussed the limitations, risks, security and privacy concerns of performing an evaluation and management service by telemedicine and the availability of in-person appointments. I also discussed with the patient that there may be a patient responsible charge related to this service. The patient expressed understanding and agreed to proceed.   Other persons participating in the visit and their role in the encounter: none   Patient's location: Home  Provider's location: [facility]   Chief Complaint: evaluation and 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  Patient notes no acute new symptoms since his last clinic visit. He does note some increased hoarseness of his voice over the last few weeks. He did have a mediastinoscopy and biopsy of paratracheal LN which confirmed metastatic laryngeal SCC. EBV and P16 neg. PDL1 testing pending results. 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- last radiation 06-2018   Cataract    removed both eyes with l;ens implants    COPD (chronic obstructive pulmonary disease) (HCC)    Family history of colon cancer    in his brother- dx'd age 51    History of  chemotherapy    last 06-01-2018   History of kidney stones    History of radiation therapy    last radiation 06-2018   Hyperlipidemia    currently under control   Hypertension    Irregular heart beats    Lymphadenopathy    Pain, lower leg    Bilateral   Peripheral arterial disease (HCC)    Pre-diabetes    Red-green color blindness    RLS (restless legs syndrome)    Snores    Wears glasses     SURGICAL HISTORY: Past Surgical History:  Procedure Laterality Date   CATARACT EXTRACTION W/ INTRAOCULAR LENS  IMPLANT, BILATERAL     COLONOSCOPY     DIRECT LARYNGOSCOPY Right 02/03/2022   Procedure: DIRECT LARYNGOSCOPY WITH BIOPSY OF RIGHT FALSE VOCAL CORD;  Surgeon: Jerrell Belfast, MD;  Location: [facility] OR;  Service: ENT;  Laterality: Right;   dislodged salava stone     FRACTURE SURGERY     HAND ARTHROPLASTY  1995   crushed left hand   INGUINAL LYMPH NODE BIOPSY Left 01/02/2018   Procedure: LEFT INGUINAL LYMPH NODE BIOPSY;  Surgeon: Rolm Bookbinder, MD;  Location: [place];  Service: General;  Laterality: Left;   IR IMAGING GUIDED PORT INSERTION  01/15/2018   IR REMOVAL TUN ACCESS W/ PORT W/O FL MOD SED  03/11/2019   MEDIASTINOSCOPY N/A 07/22/2022   Procedure: MEDIASTINOSCOPY;  Surgeon: Melrose Nakayama, MD;  Location: [place];  Service: Thoracic;  Laterality: N/A;   MICROLARYNGOSCOPY Left 01/17/2014   Procedure: MICROLARYNGOSCOPY WITH EXCISION OF THE BIOPSY OF LEFT VOCAL CORD LESION;  Surgeon: Izora Gala, MD;  Location: [place];  Service: ENT;  Laterality: Left;   MICROLARYNGOSCOPY N/A 09/16/2020   Procedure: MICROLARYNGOSCOPY with Biopsy of vocal cord lesion;  Surgeon: Jerrell Belfast, MD;  Location: [facility] OR;  Service: ENT;  Laterality: N/A;   ORIF FOOT FRACTURE  2005   left   REFRACTIVE SURGERY Right    removed cloudiness in right eye after cataract removal     SOCIAL HISTORY: Social History   Socioeconomic History   Marital status: Divorced    Spouse name: Not  on file   Number of children: 3   Years of education: Not on file   Highest education level: Not on file  Occupational History   Not on file  Tobacco Use   Smoking status: Former    Packs/day: 0.50    Years: 36.00    Additional pack years: 0.00    Total pack years: 18.00    Types: Cigarettes    Quit date: 09/28/2020    Years since quitting: 1.8   Smokeless tobacco: Never   Tobacco comments:    he denies smoking in about 2 weeks 07/13/18  Vaping Use   Vaping Use: Never used  Substance and Sexual Activity   Alcohol use: Yes    Alcohol/week: 6.0 standard drinks of alcohol    Types: 6 Cans of beer per week    Comment: weekends   Drug use: Yes    Types: Marijuana, Cocaine    Comment: reports cocaine usage ~2X/ month;  last use 07/2022   Sexual activity: Not on file  Other Topics Concern   Not on file  Social History Narrative   Not on file   Social Determinants of Health   Financial Resource Strain: Low Risk  (10/06/2020)   Overall Financial Resource Strain (CARDIA)    Difficulty of Paying Living Expenses: Not very hard  Food Insecurity: No Food Insecurity (10/06/2020)   Hunger Vital Sign    Worried About Running Out of Food in the Last Year: Never true    Ran Out of Food in the Last Year: Never true  Transportation Needs: No Transportation Needs (10/06/2020)   PRAPARE - Hydrologist (Medical): No    Lack of Transportation (Non-Medical): No  Physical Activity: Not on file  Stress: Not on file  Social Connections: Moderately Integrated (10/06/2020)   Social Connection and Isolation Panel [NHANES]    Frequency of Communication with Friends and Family: Three times a week    Frequency of Social Gatherings with Friends and Family: Three times a week    Attends Religious Services: 1 to 4 times per year    Active Member of Clubs or Organizations: Yes    Attends Archivist Meetings: 1 to 4 times per year    Marital Status: Divorced  Intimate  Partner Violence: Not At Risk (07/13/2018)   Humiliation, Afraid, Rape, and Kick questionnaire    Fear of Current or Ex-Partner: No    Emotionally Abused: No    Physically Abused: No    Sexually Abused: No    FAMILY HISTORY: Family History  Problem Relation Age of Onset   Breast cancer Mother    Diabetes Father    Hypertension Father    Stroke Father    Mental illness Sister    Hypertension Daughter    Mental illness Daughter    Hypertension Brother    Colon cancer Brother 24       passed away 12-10-2018   Breast cancer Sister    Esophageal cancer Neg Hx    Colon polyps Neg Hx    Rectal cancer Neg Hx    Stomach cancer Neg Hx     ALLERGIES:  is allergic to bee venom, antifungal [miconazole nitrate], and zolpidem tartrate er.  MEDICATIONS:  Current Outpatient Medications  Medication Sig Dispense Refill   acetaminophen (TYLENOL) 500 MG tablet Take 1,500 mg by mouth daily as needed for moderate pain.     amLODipine (NORVASC) 10 MG tablet TAKE 1 TABLET BY MOUTH EVERY DAY (Patient taking differently: Take 10 mg by mouth every evening.) 30 tablet 0   aspirin EC 81 MG tablet Take 81 mg by mouth every evening. Swallow whole.     b complex vitamins capsule Take 1 capsule by mouth daily.     lisinopril (ZESTRIL) 20 MG tablet TAKE 1 TABLET BY MOUTH EVERY DAY (Patient taking differently: Take 20 mg by mouth every evening.) 30 tablet 0   metFORMIN (GLUCOPHAGE) 500 MG tablet Take 500 mg by mouth daily.     Multiple Vitamins-Minerals (MULTI ADULT GUMMIES PO) Take 1 tablet by mouth in the morning. Centrum     oxyCODONE (OXY IR/ROXICODONE) 5 MG immediate release tablet Take 1 tablet (5 mg total) by mouth every 6 (six) hours as needed for moderate pain or breakthrough pain. 20 tablet 0   tadalafil (CIALIS) 20 MG tablet Take 20 mg by mouth daily as needed for erectile dysfunction.     No current facility-administered  medications for this visit.    REVIEW OF SYSTEMS:   10 Point review of Systems  was done is negative except as noted above. PHYSICAL EXAMINATION:  There were no vitals filed for this visit.   There were no vitals filed for this visit.   NAD GENERAL:alert, in no acute distress and comfortable SKIN: no acute rashes, no significant lesions EYES: conjunctiva are pink and non-injected, sclera anicteric OROPHARYNX: MMM, no exudates, no oropharyngeal erythema or ulceration NECK: supple, no JVD LYMPH:  rt cervical nodule, no palpable lymphadenopathy in the axillary or inguinal regions LUNGS: clear to auscultation b/l with normal respiratory effort HEART: regular rate & rhythm ABDOMEN:  normoactive bowel sounds , non tender, not distended. Extremity: no pedal edema PSYCH: alert & oriented x 3 with fluent speech NEURO: no focal motor/sensory deficits Please stop  LABORATORY DATA:   I have reviewed the data as listed    Latest Ref Rng & Units 07/19/2022    2:00 PM 05/18/2022   12:49 PM 02/03/2022   11:14 AM  CBC  WBC 4.0 - 10.5 K/uL 6.5  5.5  6.0   Hemoglobin 13.0 - 17.0 g/dL 11.8  12.5  12.1   Hematocrit 39.0 - 52.0 % 37.5  37.0  37.3   Platelets 150 - 400 K/uL 353  296  295    CBC    Component Value Date/Time   WBC 6.5 07/19/2022 1400   RBC 4.13 (L) 07/19/2022 1400   HGB 11.8 (L) 07/19/2022 1400   HGB 12.5 (L) 05/18/2022 1249   HCT 37.5 (L) 07/19/2022 1400   PLT 353 07/19/2022 1400   PLT 296 05/18/2022 1249   MCV 90.8 07/19/2022 1400   MCV 87.3 12/22/2017 1518   MCH 28.6 07/19/2022 1400   MCHC 31.5 07/19/2022 1400   RDW 13.8 07/19/2022 1400   LYMPHSABS 2.8 05/18/2022 1249   MONOABS 0.6 05/18/2022 1249   EOSABS 0.1 05/18/2022 1249   BASOSABS 0.0 05/18/2022 1249       Latest Ref Rng & Units 07/19/2022    2:00 PM 05/18/2022   12:49 PM 02/03/2022   11:14 AM  CMP  Glucose 70 - 99 mg/dL 194  182  190   BUN 8 - 23 mg/dL 11  16  15    Creatinine 0.61 - 1.24 mg/dL 1.03  1.24  1.12   Sodium 135 - 145 mmol/L 139  136  137   Potassium 3.5 - 5.1 mmol/L  4.2  3.8  4.3   Chloride 98 - 111 mmol/L 104  101  103   CO2 22 - 32 mmol/L 28  28  22    Calcium 8.9 - 10.3 mg/dL 9.4  9.2  9.5   Total Protein 6.5 - 8.1 g/dL 7.5  7.7    Total Bilirubin 0.3 - 1.2 mg/dL 0.5  0.4    Alkaline Phos 38 - 126 U/L 60  59    AST 15 - 41 U/L 16  14    ALT 0 - 44 U/L 11  8     . Lab Results  Component Value Date   LDH 168 05/18/2022   Tsh 1.42 (05/21/2021)   01/02/18 Left Inguinal LN Bx:   12/26/17 Tissue Flow Cytometry:   12/26/17 Inguinal Core biopsy:    RADIOGRAPHIC STUDIES: I have personally reviewed the radiological images as listed and agreed with the findings in the report. DG Chest 2 View  Result Date: 07/21/2022 CLINICAL DATA:  67 year old male preoperative chest x-ray EXAM: CHEST -  2 VIEW COMPARISON:  07/08/2021 FINDINGS: Cardiomediastinal silhouette unchanged in size and contour. No evidence of central vascular congestion. No interlobular septal thickening. No pneumothorax or pleural effusion. Coarsened interstitial markings, with no confluent airspace disease. No acute displaced fracture. Degenerative changes of the spine. IMPRESSION: Negative for acute cardiopulmonary disease Electronically Signed   By: Corrie Mckusick D.O.   On: 07/21/2022 16:12    ASSESSMENT & PLAN:   This is a pleasant 67 y.o. 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   CT of the abdomen and pelvis performed on 12/22/2017 showed bulky left inguinal, left hemipelvic, and  retroperitoneal adenopathy.     01/02/18 Left inguinal LN Biopsy revealed T-Cell/histocyte rich Large B-Cell Lymphoma   12/27/17 ECHO revealed LV EF of 55-60%    01/05/18 PET/CT revealed Massively enlarged pelvic lymph nodes intense metabolic  activity consistent lymphoma. 2. Additional hypermetabolic lymph nodes in the porta hepatis and retroperitoneum LEFT aorta. 3. Solitary hypermetabolic mediastinal lymph node in the upper LEFT Mediastinum. 4. 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 5. Normal spleen.     HIV non reactive on 12/22/2017.  Hep C and hep B serology negative.   03/14/18 PET/CT revealed PET-CT findings suggest an excellent response to chemotherapy. The abdominal lymphadenopathy has near completely resolved and demonstrates a near complete metabolic response. The pelvic and inguinal adenopathy has significantly decreased in size and the metabolic activity has significantly decreased. 2. Diffuse marrow activity likely due to chemotherapy and or marrow stimulating drugs. I do not see any discrete persistent lesions.    04/17/18 CT Head revealed Subtle mesial caudothalamic hypodensities may be artifact though, the could reflect encephalitis or Wernicke's encephalopathy. Consider MRI of the head with and without contrast. 2. Mild chronic small vessel ischemic changes    S/p 6 cycles of EPOCH-R completed on 06/01/18  06/28/18 PET/CT revealed Continued good response to treatment. No residual measurable or hypermetabolic abdominal lymphadenopathy and no recurrent osseous disease. 2. Interval decrease in size of the left operator region lymph node and also the left inguinal lymph node. However, the both have small foci of slightly increased hypermetabolism which bears surveillance. 3. No new or progressive lymphadenopathy in the neck, chest, abdomen or pelvis.  S/p 39.6 Gy in 22 fractions between 07/25/18 and 08/23/18  02/13/2019 CT C/A/P ([id number]) ([id number]) revealed "1. Response to therapy of pelvic adenopathy compared to the PET of 06/28/2018. 2. No new or progressive disease. 3. Mild prostatomegaly. 4. Pelvic cortical thickening and trabeculation are similar, likely related to Paget's  disease. 5. Hepatomegaly."   2) left lower extremity swelling- now resolved  Doppler ultrasound for DVT was negative in the left lower extremity.   Likely from venous compression +/- lymphatic obstruction from bulky left inguinal, left hemipelvic, and  retroperitoneal adenopathy.    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 -Discussed the MRI scan results from 06/02/2022 with the patient. Showed No evidence of acute intracranial abnormality. Chronic small-vessel ischemic changes which are moderate in the cerebral white matter, and advanced in the pons. Chronic small-vessel  ischemic changes are also present within the bilateral thalami, including a chronic right thalamic lacunar infarct. Moderate mucosal thickening within the bilateral maxillary sinuses. -Discussed that we need to wait for PET scan results before we make the decision of paratracheal LN  biopsy. PET scan is scheduled this week.  -Recommended to follow up with Neurologist for his right leg/knee pain since it might be due to nerve problems.    FOLLOW-UP:   The total time spent in the appointment was 20 minutes* .  All of the patient's questions were answered with apparent satisfaction. The patient knows to call the clinic with any problems, questions or concerns.   Sullivan Lone MD MS AAHIVMS [facility] Hematology/Oncology Physician [facility]  .*Total Encounter Time as defined by the Centers for Medicare and Medicaid Services includes, in addition to the face-to-face time of a patient visit (documented in the note above) non-face-to-face time: obtaining and reviewing outside history, ordering and reviewing medications, tests or procedures,  care coordination (communications with other health care professionals or caregivers) and documentation in the medical record.   I, Cleda Mccreedy, am acting as a Education administrator for Sullivan Lone, MD. .I have reviewed the above documentation for accuracy and completeness, and I agree with the above. Brunetta Genera MD

## 2022-08-04 ENCOUNTER — Encounter: Payer: Self-pay | Admitting: Hematology

## 2022-08-04 DIAGNOSIS — C760 Malignant neoplasm of head, face and neck: Secondary | ICD-10-CM | POA: Insufficient documentation

## 2022-08-04 MED ORDER — PROCHLORPERAZINE MALEATE 10 MG PO TABS: 10.0000 mg | ORAL_TABLET | Freq: Four times a day (QID) | ORAL | 1 refills | Status: DC | PRN

## 2022-08-04 MED ORDER — LIDOCAINE-PRILOCAINE 2.5-2.5 % EX CREA: TOPICAL_CREAM | CUTANEOUS | 3 refills | Status: DC

## 2022-08-04 MED ORDER — DEXAMETHASONE 4 MG PO TABS: ORAL_TABLET | ORAL | 1 refills | Status: DC

## 2022-08-04 MED ORDER — ONDANSETRON HCL 8 MG PO TABS: 8.0000 mg | ORAL_TABLET | Freq: Three times a day (TID) | ORAL | 1 refills | Status: DC | PRN

## 2022-08-04 NOTE — Progress Notes (Signed)
[place]   [phone]) [phone] [phone]) [phone]   PHONE VISIT NOTE  Date of Service:  08/03/22   Patient Care Team: Shirline Frees, MD as PCP - General (Family Medicine) Josue Hector, MD as PCP - Cardiology (Cardiology) Eppie Gibson, MD as Consulting Physician (Radiation Oncology) Malmfelt, Stephani Police, RN as Oncology Nurse Navigator Jerrell Belfast, MD as Consulting Physician (Otolaryngology) Brunetta Genera, MD as Consulting Physician (Hematology)   Date of Service:  08/03/2022  CHIEF COMPLAINTS/PURPOSE OF CONSULTATION:  Continued evaluation and management of newly noted metastatic laryngeal squamous cell carcinoma.  HISTORY OF PRESENTING ILLNESS:   Jesus [name] 67 y.o. male is here because of left lower extremity edema and lymphadenopathy.  The patient was seen in the emergency room this past Friday for the same issue.  A CT of the abdomen and pelvis was performed showing bulky left inguinal, left hemipelvic, and retroperitoneal adenopathy.  He was referred to [place] from the emergency room for further evaluation.  Doppler ultrasound of the left lower extremity was performed and was negative for DVT. Patient reports that he has been having left lower extremity edema in his left groin and left leg for approximately 1 month.  He states that the swelling in the left groin started to get better but then worsened.  The left lower extremity edema has slowly worsened over time.  Patient denies having fevers and chills.  He reports that he does have night sweats at times.  He reported having [place] approximate 1 [place]  He thinks his headaches are related to not having his blood pressure medication.  His headaches have now resolved.  He denies visual changes.  The patient denies chest pain, shortness of breath and cough.  No nausea, vomiting, constipation, diarrhea.  Denies abdominal pain.  The patient denies recent weight loss  and has actually gained weight recently.  Patient denies epistaxis, bleeding gums, hemoptysis, hematuria, but occasionally, and melena.  He reports increased urinary frequency over the past month but no dysuria.  The patient is here for evaluation and discussion of his recent CT and lab findings.  Interval History:  Marland KitchenMarland KitchenI connected with Jesus [name] on 07/28/2022 at  3:30 PM EDT by telephone visit and verified that I am speaking with the correct person using two identifiers.   I discussed the limitations, risks, security and privacy concerns of performing an evaluation and management service by telemedicine and the availability of in-person appointments. I also discussed with the patient that there may be a patient responsible charge related to this service. The patient expressed understanding and agreed to proceed.   Other persons participating in the visit and their role in the encounter: none   Patient's location: Home  Provider's location: [facility]   Chief Complaint: evaluation and 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  Patient notes no acute new symptoms since his last clinic visit. He does note some increased hoarseness of his voice over the last few weeks. He did have a mediastinoscopy and biopsy of paratracheal LN which confirmed metastatic laryngeal SCC. EBV and P16 neg. PDL1 testing pending results. We discussed that involvement of distant LN in mediastinum confirmed distant mets and though he could potentally achieve NED status the situation is not technically considered curable. Dr Roxan Hockey CTSx did not feel the visible disease was completely resectable. No other acute new symptoms at this time.    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- last radiation  06-2018   Cataract    removed both eyes with l;ens implants    COPD (chronic obstructive pulmonary disease) (HCC)    Family history of colon cancer    in his brother- dx'd age 67    History of chemotherapy    last 06-01-2018   History of kidney stones    History of radiation therapy    last radiation 06-2018   Hyperlipidemia    currently under control   Hypertension    Irregular heart beats    Lymphadenopathy    Pain, lower leg    Bilateral   Peripheral arterial disease (HCC)    Pre-diabetes    Red-green color blindness    RLS (restless legs syndrome)    Snores    Wears glasses     SURGICAL HISTORY: Past Surgical History:  Procedure Laterality Date   CATARACT EXTRACTION W/ INTRAOCULAR LENS  IMPLANT, BILATERAL     COLONOSCOPY     DIRECT LARYNGOSCOPY Right 02/03/2022   Procedure: DIRECT LARYNGOSCOPY WITH BIOPSY OF RIGHT FALSE VOCAL CORD;  Surgeon: Jerrell Belfast, MD;  Location: [facility] OR;  Service: ENT;  Laterality: Right;   dislodged salava stone     FRACTURE SURGERY     HAND ARTHROPLASTY  1995   crushed left hand   INGUINAL LYMPH NODE BIOPSY Left 01/02/2018   Procedure: LEFT INGUINAL LYMPH NODE BIOPSY;  Surgeon: Rolm Bookbinder, MD;  Location: [place];  Service: General;  Laterality: Left;   IR IMAGING GUIDED PORT INSERTION  01/15/2018   IR REMOVAL TUN ACCESS W/ PORT W/O FL MOD SED  03/11/2019   MEDIASTINOSCOPY N/A 07/22/2022   Procedure: MEDIASTINOSCOPY;  Surgeon: Melrose Nakayama, MD;  Location: [facility] OR;  Service: Thoracic;  Laterality: N/A;   MICROLARYNGOSCOPY Left 01/17/2014   Procedure: MICROLARYNGOSCOPY WITH EXCISION OF THE BIOPSY OF LEFT VOCAL CORD LESION;  Surgeon: Izora Gala, MD;  Location: [place];  Service: ENT;  Laterality: Left;   MICROLARYNGOSCOPY N/A 09/16/2020   Procedure: MICROLARYNGOSCOPY with Biopsy of vocal cord lesion;  Surgeon: Jerrell Belfast, MD;  Location: [facility] OR;  Service: ENT;  Laterality: N/A;   ORIF FOOT FRACTURE  2005   left    REFRACTIVE SURGERY Right    removed cloudiness in right eye after cataract removal     SOCIAL HISTORY: Social History   Socioeconomic History   Marital status: Divorced    Spouse name: Not on file   Number of children: 3   Years of education: Not on file   Highest education level: Not on file  Occupational History   Not on file  Tobacco Use   Smoking status: Former    Packs/day: 0.50    Years: 36.00    Additional pack years: 0.00    Total pack years: 18.00    Types: Cigarettes    Quit date: 09/28/2020    Years since quitting: 1.8   Smokeless tobacco: Never   Tobacco comments:    he denies smoking in about 2 weeks 07/13/18  Vaping Use   Vaping Use: Never used  Substance and Sexual Activity   Alcohol use: Yes    Alcohol/week: 6.0 standard drinks of alcohol    Types:  6 Cans of beer per week    Comment: weekends   Drug use: Yes    Types: Marijuana, Cocaine    Comment: reports cocaine usage ~2X/ month; last use 07/2022   Sexual activity: Not on file  Other Topics Concern   Not on file  Social History Narrative   Not on file   Social Determinants of Health   Financial Resource Strain: Low Risk  (10/06/2020)   Overall Financial Resource Strain (CARDIA)    Difficulty of Paying Living Expenses: Not very hard  Food Insecurity: No Food Insecurity (10/06/2020)   Hunger Vital Sign    Worried About Running Out of Food in the Last Year: Never true    Ran Out of Food in the Last Year: Never true  Transportation Needs: No Transportation Needs (10/06/2020)   PRAPARE - Hydrologist (Medical): No    Lack of Transportation (Non-Medical): No  Physical Activity: Not on file  Stress: Not on file  Social Connections: Moderately Integrated (10/06/2020)   Social Connection and Isolation Panel [NHANES]    Frequency of Communication with Friends and Family: Three times a week    Frequency of Social Gatherings with Friends and Family: Three times a week    Attends  Religious Services: 1 to 4 times per year    Active Member of Clubs or Organizations: Yes    Attends Archivist Meetings: 1 to 4 times per year    Marital Status: Divorced  Intimate Partner Violence: Not At Risk (07/13/2018)   Humiliation, Afraid, Rape, and Kick questionnaire    Fear of Current or Ex-Partner: No    Emotionally Abused: No    Physically Abused: No    Sexually Abused: No    FAMILY HISTORY: Family History  Problem Relation Age of Onset   Breast cancer Mother    Diabetes Father    Hypertension Father    Stroke Father    Mental illness Sister    Hypertension Daughter    Mental illness Daughter    Hypertension Brother    Colon cancer Brother 56       passed away 01/03/2019   Breast cancer Sister    Esophageal cancer Neg Hx    Colon polyps Neg Hx    Rectal cancer Neg Hx    Stomach cancer Neg Hx     ALLERGIES:  is allergic to bee venom, antifungal [miconazole nitrate], and zolpidem tartrate er.  MEDICATIONS:  Current Outpatient Medications  Medication Sig Dispense Refill   acetaminophen (TYLENOL) 500 MG tablet Take 1,500 mg by mouth daily as needed for moderate pain.     amLODipine (NORVASC) 10 MG tablet TAKE 1 TABLET BY MOUTH EVERY DAY (Patient taking differently: Take 10 mg by mouth every evening.) 30 tablet 0   aspirin EC 81 MG tablet Take 81 mg by mouth every evening. Swallow whole.     b complex vitamins capsule Take 1 capsule by mouth daily.     lisinopril (ZESTRIL) 20 MG tablet TAKE 1 TABLET BY MOUTH EVERY DAY (Patient taking differently: Take 20 mg by mouth every evening.) 30 tablet 0   metFORMIN (GLUCOPHAGE) 500 MG tablet Take 500 mg by mouth daily.     Multiple Vitamins-Minerals (MULTI ADULT GUMMIES PO) Take 1 tablet by mouth in the morning. Centrum     oxyCODONE (OXY IR/ROXICODONE) 5 MG immediate release tablet Take 1 tablet (5 mg total) by mouth every 6 (six) hours as needed for moderate pain  or breakthrough pain. 20 tablet 0   tadalafil (CIALIS)  20 MG tablet Take 20 mg by mouth daily as needed for erectile dysfunction.     No current facility-administered medications for this visit.    REVIEW OF SYSTEMS:   10 Point review of Systems was done is negative except as noted above. PHYSICAL EXAMINATION: Phone visit  LABORATORY DATA:   I have reviewed the data as listed    Latest Ref Rng & Units 07/19/2022    2:00 PM 05/18/2022   12:49 PM 02/03/2022   11:14 AM  CBC  WBC 4.0 - 10.5 K/uL 6.5  5.5  6.0   Hemoglobin 13.0 - 17.0 g/dL 11.8  12.5  12.1   Hematocrit 39.0 - 52.0 % 37.5  37.0  37.3   Platelets 150 - 400 K/uL 353  296  295    CBC    Component Value Date/Time   WBC 6.5 07/19/2022 1400   RBC 4.13 (L) 07/19/2022 1400   HGB 11.8 (L) 07/19/2022 1400   HGB 12.5 (L) 05/18/2022 1249   HCT 37.5 (L) 07/19/2022 1400   PLT 353 07/19/2022 1400   PLT 296 05/18/2022 1249   MCV 90.8 07/19/2022 1400   MCV 87.3 12/22/2017 1518   MCH 28.6 07/19/2022 1400   MCHC 31.5 07/19/2022 1400   RDW 13.8 07/19/2022 1400   LYMPHSABS 2.8 05/18/2022 1249   MONOABS 0.6 05/18/2022 1249   EOSABS 0.1 05/18/2022 1249   BASOSABS 0.0 05/18/2022 1249       Latest Ref Rng & Units 07/19/2022    2:00 PM 05/18/2022   12:49 PM 02/03/2022   11:14 AM  CMP  Glucose 70 - 99 mg/dL 194  182  190   BUN 8 - 23 mg/dL 11  16  15    Creatinine 0.61 - 1.24 mg/dL 1.03  1.24  1.12   Sodium 135 - 145 mmol/L 139  136  137   Potassium 3.5 - 5.1 mmol/L 4.2  3.8  4.3   Chloride 98 - 111 mmol/L 104  101  103   CO2 22 - 32 mmol/L 28  28  22    Calcium 8.9 - 10.3 mg/dL 9.4  9.2  9.5   Total Protein 6.5 - 8.1 g/dL 7.5  7.7    Total Bilirubin 0.3 - 1.2 mg/dL 0.5  0.4    Alkaline Phos 38 - 126 U/L 60  59    AST 15 - 41 U/L 16  14    ALT 0 - 44 U/L 11  8     . Lab Results  Component Value Date   LDH 168 05/18/2022   Tsh 1.42 (05/21/2021)   01/02/18 Left Inguinal LN Bx:   12/26/17 Tissue Flow Cytometry:   12/26/17 Inguinal Core biopsy:    RADIOGRAPHIC  STUDIES: I have personally reviewed the radiological images as listed and agreed with the findings in the report. DG Chest 2 View  Result Date: 07/21/2022 CLINICAL DATA:  67 year old male preoperative chest x-ray EXAM: CHEST - 2 VIEW COMPARISON:  07/08/2021 FINDINGS: Cardiomediastinal silhouette unchanged in size and contour. No evidence of central vascular congestion. No interlobular septal thickening. No pneumothorax or pleural effusion. Coarsened interstitial markings, with no confluent airspace disease. No acute displaced fracture. Degenerative changes of the spine. IMPRESSION: Negative for acute cardiopulmonary disease Electronically Signed   By: Corrie Mckusick D.O.   On: 07/21/2022 16:12    ASSESSMENT & PLAN:   This is a pleasant 67 y.o. 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   CT of the abdomen and pelvis performed on 12/22/2017 showed bulky left inguinal, left hemipelvic, and  retroperitoneal adenopathy.     01/02/18 Left inguinal LN Biopsy revealed T-Cell/histocyte rich Large B-Cell Lymphoma   12/27/17 ECHO revealed LV EF of 55-60%    01/05/18 PET/CT revealed Massively enlarged pelvic lymph nodes intense metabolic activity consistent lymphoma. 2. Additional hypermetabolic lymph nodes in the porta hepatis and retroperitoneum LEFT aorta. 3. Solitary hypermetabolic mediastinal lymph node in the upper LEFT Mediastinum. 4. 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 5. Normal spleen.     HIV non reactive on 12/22/2017.  Hep C and hep B serology negative.   03/14/18 PET/CT revealed PET-CT findings suggest an excellent response to chemotherapy. The abdominal lymphadenopathy has near completely  resolved and demonstrates a near complete metabolic response. The pelvic and inguinal adenopathy has significantly decreased in size and the metabolic activity has significantly decreased. 2. Diffuse marrow activity likely due to chemotherapy and or marrow stimulating drugs. I do not see any discrete persistent lesions.    04/17/18 CT Head revealed Subtle mesial caudothalamic hypodensities may be artifact though, the could reflect encephalitis or Wernicke's encephalopathy. Consider MRI of the head with and without contrast. 2. Mild chronic small vessel ischemic changes    S/p 6 cycles of EPOCH-R completed on 06/01/18  06/28/18 PET/CT revealed Continued good response to treatment. No residual measurable or hypermetabolic abdominal lymphadenopathy and no recurrent osseous disease. 2. Interval decrease in size of the left operator region lymph node and also the left inguinal lymph node. However, the both have small foci of slightly increased hypermetabolism which bears surveillance. 3. No new or progressive lymphadenopathy in the neck, chest, abdomen or pelvis.  S/p 39.6 Gy in 22 fractions between 07/25/18 and 08/23/18  02/13/2019 CT C/A/P ([id number]) ([id number]) revealed "1. Response to therapy of pelvic adenopathy compared to the PET of 06/28/2018. 2. No new or progressive disease. 3. Mild prostatomegaly. 4. 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 5. Hepatomegaly."   2) left lower extremity swelling- now resolved  Doppler ultrasound for DVT was negative in the left lower extremity.   Likely from venous compression +/- lymphatic obstruction from bulky left inguinal, left hemipelvic, and  retroperitoneal adenopathy.    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 PLAN: -patient pathology results from his paratracheal LN bx were discussed and confirmed metastatic laryngeal squamous cell carcinoma -EBV neg P16 neg -PDL1 testing pending -per Dr Roxan Hockey CTSx -- not possible to perform complete resection of visible disease -discussed goals of care in details with the patient -- metastatic stage IVdisease not curable but will try to achieve NED status -control of evolving metastatic disease is the primary goal and we shall start with systemic treatment with carbo/5FU/Pembrolizumab and based on PdL1 status and chemo tolerability might transition to pembrolizumab alone. -will consider palliative RT to any residual disease. - chemo-immunotherapy orders placed  with patients consent-- will plan to start treatment in next couple of weeks to heal from mediastinoscopy and have port a cath placement  FOLLOW-UP:  -Port a cath placement by IIR in 1 week -schedule to start chemotherapy in 1-2 weeks . Marland KitchenThe total time spent in the appointment was 32 minutes* .  All of the patient's questions were answered with apparent satisfaction. The patient knows to call the clinic with any problems, questions or concerns.   Sullivan Lone MD MS AAHIVMS [facility] Hematology/Oncology Physician [facility]  .*Total Encounter Time as defined by the Centers for Medicare and Medicaid Services includes, in addition to the face-to-face time of a patient visit (documented in the note above) non-face-to-face time: obtaining and reviewing outside history, ordering and reviewing medications, tests or procedures, care coordination (communications with other health care professionals or caregivers) and documentation in the medical record.

## 2022-08-08 ENCOUNTER — Telehealth: Payer: Self-pay | Admitting: Hematology

## 2022-08-08 NOTE — Telephone Encounter (Signed)
Called patient to confirm new appointment dates/times for patient education and treatment. Left voicemail with new appointment information and contact details if needing to reschedule.

## 2022-08-08 NOTE — Telephone Encounter (Signed)
Patient returned call and confirmed new appointments. Patient notified and will be here 4/5 for patient education.

## 2022-08-09 ENCOUNTER — Other Ambulatory Visit: Payer: Self-pay

## 2022-08-09 ENCOUNTER — Other Ambulatory Visit (HOSPITAL_COMMUNITY): Payer: Self-pay | Admitting: Student

## 2022-08-10 ENCOUNTER — Ambulatory Visit (HOSPITAL_COMMUNITY)
Admission: RE | Admit: 2022-08-10 | Discharge: 2022-08-10 | Disposition: A | Discharge status: Home / Self Care | Payer: Medicare HMO | Source: Ambulatory Visit | Attending: Hematology | Admitting: Hematology

## 2022-08-10 ENCOUNTER — Encounter (HOSPITAL_COMMUNITY): Payer: Self-pay

## 2022-08-10 DIAGNOSIS — Z452 Encounter for adjustment and management of vascular access device: Secondary | ICD-10-CM | POA: Diagnosis not present

## 2022-08-10 DIAGNOSIS — G2581 Restless legs syndrome: Secondary | ICD-10-CM | POA: Insufficient documentation

## 2022-08-10 DIAGNOSIS — C760 Malignant neoplasm of head, face and neck: Secondary | ICD-10-CM | POA: Insufficient documentation

## 2022-08-10 DIAGNOSIS — E785 Hyperlipidemia, unspecified: Secondary | ICD-10-CM | POA: Insufficient documentation

## 2022-08-10 DIAGNOSIS — R7303 Prediabetes: Secondary | ICD-10-CM | POA: Insufficient documentation

## 2022-08-10 DIAGNOSIS — J449 Chronic obstructive pulmonary disease, unspecified: Secondary | ICD-10-CM | POA: Insufficient documentation

## 2022-08-10 DIAGNOSIS — I10 Essential (primary) hypertension: Secondary | ICD-10-CM | POA: Insufficient documentation

## 2022-08-10 DIAGNOSIS — I739 Peripheral vascular disease, unspecified: Secondary | ICD-10-CM | POA: Insufficient documentation

## 2022-08-10 HISTORY — PX: IR IMAGING GUIDED PORT INSERTION: IMG5740

## 2022-08-10 LAB — GLUCOSE, CAPILLARY: Glucose-Capillary: 159 mg/dL — ABNORMAL HIGH (ref 70–99)

## 2022-08-10 MED ORDER — METOPROLOL TARTRATE 5 MG/5ML IV SOLN
INTRAVENOUS | Status: AC | PRN
  Administered 2022-08-10: 5 mg via INTRAVENOUS

## 2022-08-10 MED ORDER — MIDAZOLAM HCL 2 MG/2ML IJ SOLN
INTRAMUSCULAR | Status: AC
  Filled 2022-08-10: qty 2

## 2022-08-10 MED ORDER — HEPARIN SOD (PORK) LOCK FLUSH 100 UNIT/ML IV SOLN
INTRAVENOUS | Status: AC
  Filled 2022-08-10: qty 5

## 2022-08-10 MED ORDER — LIDOCAINE-EPINEPHRINE 1 %-1:100000 IJ SOLN
20.0000 mL | Freq: Once | INTRAMUSCULAR | Status: AC
  Administered 2022-08-10: 20 mL via INTRADERMAL

## 2022-08-10 MED ORDER — FENTANYL CITRATE (PF) 100 MCG/2ML IJ SOLN
INTRAMUSCULAR | Status: AC | PRN
  Administered 2022-08-10 (×2): 50 ug via INTRAVENOUS

## 2022-08-10 MED ORDER — MIDAZOLAM HCL 2 MG/2ML IJ SOLN
INTRAMUSCULAR | Status: AC | PRN
  Administered 2022-08-10 (×2): 1 mg via INTRAVENOUS

## 2022-08-10 MED ORDER — FENTANYL CITRATE (PF) 100 MCG/2ML IJ SOLN
INTRAMUSCULAR | Status: AC
  Filled 2022-08-10: qty 2

## 2022-08-10 MED ORDER — DIPHENHYDRAMINE HCL 50 MG/ML IJ SOLN
INTRAMUSCULAR | Status: AC | PRN
  Administered 2022-08-10: 50 mg via INTRAVENOUS

## 2022-08-10 MED ORDER — SODIUM CHLORIDE 0.9 % IV SOLN: INTRAVENOUS | Status: DC

## 2022-08-10 MED ORDER — DIPHENHYDRAMINE HCL 50 MG/ML IJ SOLN
INTRAMUSCULAR | Status: AC
  Filled 2022-08-10: qty 1

## 2022-08-10 MED ORDER — HEPARIN SOD (PORK) LOCK FLUSH 100 UNIT/ML IV SOLN
500.0000 [IU] | Freq: Once | INTRAVENOUS | Status: AC
  Administered 2022-08-10: 500 [IU] via INTRAVENOUS

## 2022-08-10 MED ORDER — LIDOCAINE-EPINEPHRINE 1 %-1:100000 IJ SOLN
INTRAMUSCULAR | Status: AC
  Filled 2022-08-10: qty 1

## 2022-08-10 MED ORDER — METOPROLOL TARTRATE 5 MG/5ML IV SOLN
INTRAVENOUS | Status: AC
  Filled 2022-08-10: qty 5

## 2022-08-10 NOTE — H&P (Signed)
Referring Physician(s): Brunetta Genera  Supervising Physician: Arne Cleveland  Patient Status:  [name] OP  Chief Complaint: "I'm here for a port a cath"   Subjective: Patient familiar to IR service from left inguinal lymph node biopsy in 2019, Port-A-Cath placement in 2019 with removal in 2020 and right cervical lymph node biopsy on 05/25/2022.  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  Scheduled today for Port-A-Cath placement to assist with additional treatment.  Past medical history also significant for anemia, arthritis, COPD, cholelithiasis, hypertension, hyperlipidemia, peripheral arterial disease, irregular heart rhythm, prediabetes, restless leg syndrome. He denies fever, headache, chest pain, dyspnea, cough, abdominal/back pain, nausea, vomiting or bleeding.  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- last radiation 06-2018   Cataract    removed both eyes with l;ens implants    COPD (chronic obstructive pulmonary disease)    Family history of colon cancer    in his brother- dx'd age 28    History of chemotherapy    last 06-01-2018   History of kidney stones    History of radiation therapy    last radiation 06-2018   Hyperlipidemia    currently under control   Hypertension    Irregular heart beats    Lymphadenopathy    Pain, lower leg    Bilateral   Peripheral arterial disease    Pre-diabetes    Red-green color blindness    RLS (restless legs syndrome)    Snores    Wears glasses    Past Surgical History:  Procedure Laterality Date   CATARACT EXTRACTION W/ INTRAOCULAR LENS  IMPLANT, BILATERAL     COLONOSCOPY     DIRECT LARYNGOSCOPY Right 02/03/2022   Procedure: DIRECT LARYNGOSCOPY  WITH BIOPSY OF RIGHT FALSE VOCAL CORD;  Surgeon: Jerrell Belfast, MD;  Location: [facility];  Service: ENT;  Laterality: Right;   dislodged salava stone     FRACTURE SURGERY     HAND ARTHROPLASTY  1995   crushed left hand   INGUINAL LYMPH NODE BIOPSY Left 01/02/2018   Procedure: LEFT INGUINAL LYMPH NODE BIOPSY;  Surgeon: Rolm Bookbinder, MD;  Location: [facility];  Service: General;  Laterality: Left;   IR IMAGING GUIDED PORT INSERTION  01/15/2018   IR REMOVAL TUN ACCESS W/ PORT W/O FL MOD SED  03/11/2019   MEDIASTINOSCOPY N/A 07/22/2022   Procedure: MEDIASTINOSCOPY;  Surgeon: Melrose Nakayama, MD;  Location: [place];  Service: Thoracic;  Laterality: N/A;   MICROLARYNGOSCOPY Left 01/17/2014   Procedure: MICROLARYNGOSCOPY WITH EXCISION OF THE BIOPSY OF LEFT VOCAL CORD LESION;  Surgeon: Izora Gala, MD;  Location: [facility];  Service: ENT;  Laterality: Left;   MICROLARYNGOSCOPY N/A 09/16/2020   Procedure: MICROLARYNGOSCOPY with Biopsy of vocal cord lesion;  Surgeon: Jerrell Belfast, MD;  Location: [facility];  Service: ENT;  Laterality: N/A;   ORIF FOOT FRACTURE  2005   left   REFRACTIVE SURGERY Right    removed cloudiness in right eye after cataract removal       Allergies: Bee venom, Antifungal [miconazole nitrate], and Zolpidem tartrate er  Medications: Prior to Admission medications   Medication Sig Start Date  End Date Taking? Authorizing Provider  acetaminophen (TYLENOL) 500 MG tablet Take 1,500 mg by mouth daily as needed for moderate pain.   Yes [provider]  amLODipine (NORVASC) 10 MG tablet TAKE 1 TABLET BY MOUTH EVERY DAY Patient taking differently: Take 10 mg by mouth every evening. 02/19/20  Yes Wendie Agreste, MD  aspirin EC 81 MG tablet Take 81 mg by mouth every evening. Swallow whole.   Yes [provider]  b complex vitamins capsule Take 1 capsule by mouth daily.   Yes [provider]  lisinopril (ZESTRIL) 20 MG tablet TAKE 1 TABLET  BY MOUTH EVERY DAY Patient taking differently: Take 20 mg by mouth every evening. 02/19/20  Yes Wendie Agreste, MD  metFORMIN (GLUCOPHAGE) 500 MG tablet Take 500 mg by mouth daily. 01/20/22  Yes [provider]  Multiple Vitamins-Minerals (MULTI ADULT GUMMIES PO) Take 1 tablet by mouth in the morning. Centrum   Yes [provider]  oxyCODONE (OXY IR/ROXICODONE) 5 MG immediate release tablet Take 1 tablet (5 mg total) by mouth every 6 (six) hours as needed for moderate pain or breakthrough pain. 07/22/22  Yes Melrose Nakayama, MD  tadalafil (CIALIS) 20 MG tablet Take 20 mg by mouth daily as needed for erectile dysfunction. 07/03/20  Yes [provider]  dexamethasone (DECADRON) 4 MG tablet Take 2 tablets (8mg ) by mouth daily starting the day after carboplatin for 3 days. Take with food 08/04/22   Brunetta Genera, MD  lidocaine-prilocaine (EMLA) cream Apply to affected area once 08/04/22   Brunetta Genera, MD  ondansetron (ZOFRAN) 8 MG tablet Take 1 tablet (8 mg total) by mouth every 8 (eight) hours as needed for nausea or vomiting. Start on the third day after carboplatin. 08/04/22   Brunetta Genera, MD  prochlorperazine (COMPAZINE) 10 MG tablet Take 1 tablet (10 mg total) by mouth every 6 (six) hours as needed for nausea or vomiting. 08/04/22   Brunetta Genera, MD     Vital Signs: BP 116/72   Pulse 68   Resp 20   Ht 6' (1.829 m)   Wt (!) 323 lb (146.5 kg)   SpO2 97%   BMI 43.81 kg/m    Code Status: FULL CODE  Physical Exam: awake/alert; chest- CTA bilat; heart- nl rate, occ ectopy noted; abd- obese, soft,+BS,some mild gen tenderness to palpation; trace-1+ pretibial edema bilat  Imaging: No results found.  Labs:  CBC: Recent Labs    12/29/21 0847 02/03/22 1114 05/18/22 1249 07/19/22 1400  WBC 4.9 6.0 5.5 6.5  HGB 11.6* 12.1* 12.5* 11.8*  HCT 34.3* 37.3* 37.0* 37.5*  PLT 284 295 296 353    COAGS: Recent Labs     07/19/22 1400  INR 1.0  APTT 27    BMP: Recent Labs    12/29/21 0847 02/03/22 1114 05/18/22 1249 07/19/22 1400  NA 138 137 136 139  K 4.2 4.3 3.8 4.2  CL 104 103 101 104  CO2 26 22 28 28   GLUCOSE 154* 190* 182* 194*  BUN 15 15 16 11   CALCIUM 9.4 9.5 9.2 9.4  CREATININE 0.91 1.12 1.24 1.03  GFRNONAA >60 >60 >60 >60    LIVER FUNCTION TESTS: Recent Labs    11/30/21 1528 12/29/21 0847 05/18/22 1249 07/19/22 1400  BILITOT 0.4 0.7 0.4 0.5  AST 15 17 14* 16  ALT 11 15 8 11   ALKPHOS 60 57 59 60  PROT 7.0 7.1 7.7 7.5  ALBUMIN 4.3  4.4 4.4 3.9    Assessment and Plan: Patient familiar to IR service from left inguinal lymph node biopsy in 2019, Port-A-Cath placement in 2019 with removal in 2020 and right cervical lymph node biopsy on 05/25/2022.  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  Scheduled today for Port-A-Cath placement to assist with additional treatment.  Past medical history also significant for anemia, arthritis, COPD, cholelithiasis, hypertension, hyperlipidemia, peripheral arterial disease, irregular heart rhythm, prediabetes, restless leg syndrome. Risks and benefits of image guided port-a-catheter placement was discussed with the patient including, but not limited to bleeding, infection, pneumothorax, or fibrin sheath development and need for additional procedures.  All of the patient's questions were answered, patient is agreeable to proceed. Consent signed and in chart.    Electronically Signed: D. Rowe Robert, PA-C 08/10/2022, 1:07 PM   I spent a total of 20 minutes at the the patient's bedside AND on the patient's hospital floor or unit, greater than 50% of which was counseling/coordinating care for port a cath placement

## 2022-08-10 NOTE — Procedures (Signed)
  Procedure:  R IJ Port catheter placement   Preprocedure diagnosis: The encounter diagnosis was Head and neck cancer. Postprocedure diagnosis: same EBL:    minimal Complications:   none immediate  See full dictation in [employer].  Dillard Cannon MD Main # [phone] Pager  [phone] Mobile [phone]

## 2022-08-10 NOTE — Discharge Instructions (Signed)
Please call Interventional Radiology clinic [phone] with any questions or concerns.  You may remove your dressing and shower tomorrow.  DO NOT use EMLA cream on your port site for 2 weeks as this cream will remove surgical glue on your incision. However, you may use your port immediately.  Implanted Port Insertion, Care After This sheet gives you information about how to care for yourself after your procedure. Your health care provider may also give you more specific instructions. If you have problems or questions, contact your health care provider. What can I expect after the procedure? After the procedure, it is common to have: Discomfort at the port insertion site. Bruising on the skin over the port. This should improve over 3-4 days. Follow these instructions at home: [facility] care After your port is placed, you will get a manufacturer's information card. The card has information about your port. Keep this card with you at all times. Take care of the port as told by your health care provider. Ask your health care provider if you or a family member can get training for taking care of the port at home. A home health care nurse may also take care of the port. Make sure to remember what type of port you have. Incision care Follow instructions from your health care provider about how to take care of your port insertion site. Make sure you: Wash your hands with soap and water before and after you change your bandage (dressing). If soap and water are not available, use hand sanitizer. Change your dressing as told by your health care provider. Leave stitches (sutures), skin glue, or adhesive strips in place. These skin closures may need to stay in place for 2 weeks or longer. If adhesive strip edges start to loosen and curl up, you may trim the loose edges. Do not remove adhesive strips completely unless your health care provider tells you to do that. Check your port insertion site every day for  signs of infection. Check for: Redness, swelling, or pain. Fluid or blood. Warmth. Pus or a bad smell.        Activity Return to your normal activities as told by your health care provider. Ask your health care provider what activities are safe for you. Do not lift anything that is heavier than 10 lb (4.5 kg), or the limit that you are told, until your health care provider says that it is safe. General instructions Take over-the-counter and prescription medicines only as told by your health care provider. Do not take baths, swim, or use a hot tub until your health care provider approves. Ask your health care provider if you may take showers. You may only be allowed to take sponge baths. Do not drive for 24 hours if you were given a sedative during your procedure. Wear a medical alert bracelet in case of an emergency. This will tell any health care providers that you have a port. Keep all follow-up visits as told by your health care provider. This is important. Contact a health care provider if: You cannot flush your port with saline as directed, or you cannot draw blood from the port. You have a fever or chills. You have redness, swelling, or pain around your port insertion site. You have fluid or blood coming from your port insertion site. Your port insertion site feels warm to the touch. You have pus or a bad smell coming from the port insertion site. Get help right away if: You have chest pain or  shortness of breath. You have bleeding from your port that you cannot control. Summary Take care of the port as told by your health care provider. Keep the manufacturer's information card with you at all times. Change your dressing as told by your health care provider. Contact a health care provider if you have a fever or chills or if you have redness, swelling, or pain around your port insertion site. Keep all follow-up visits as told by your health care provider. This information is not  intended to replace advice given to you by your health care provider. Make sure you discuss any questions you have with your health care provider. Document Revised: 11/21/2017 Document Reviewed: 11/21/2017 Elsevier Patient Education  2021 Elsevier Inc.   Moderate Conscious Sedation, Adult, Care After This sheet gives you information about how to care for yourself after your procedure. Your health care provider may also give you more specific instructions. If you have problems or questions, contact your health care provider. What can I expect after the procedure? After the procedure, it is common to have: Sleepiness for several hours. Impaired judgment for several hours. Difficulty with balance. Vomiting if you eat too soon. Follow these instructions at home: For the time period you were told by your health care provider: Rest. Do not participate in activities where you could fall or become injured. Do not drive or use machinery. Do not drink alcohol. Do not take sleeping pills or medicines that cause drowsiness. Do not make important decisions or sign legal documents. Do not take care of children on your own.        Eating and drinking Follow the diet recommended by your health care provider. Drink enough fluid to keep your urine pale yellow. If you vomit: Drink water, juice, or soup when you can drink without vomiting. Make sure you have little or no nausea before eating solid foods.    General instructions Take over-the-counter and prescription medicines only as told by your health care provider. Have a responsible adult stay with you for the time you are told. It is important to have someone help care for you until you are awake and alert. Do not smoke. Keep all follow-up visits as told by your health care provider. This is important. Contact a health care provider if: You are still sleepy or having trouble with balance after 24 hours. You feel light-headed. You keep  feeling nauseous or you keep vomiting. You develop a rash. You have a fever. You have redness or swelling around the IV site. Get help right away if: You have trouble breathing. You have new-onset confusion at home. Summary After the procedure, it is common to feel sleepy, have impaired judgment, or feel nauseous if you eat too soon. Rest after you get home. Know the things you should not do after the procedure. Follow the diet recommended by your health care provider and drink enough fluid to keep your urine pale yellow. Get help right away if you have trouble breathing or new-onset confusion at home. This information is not intended to replace advice given to you by your health care provider. Make sure you discuss any questions you have with your health care provider. Document Revised: 08/23/2019 Document Reviewed: 03/21/2019 Elsevier Patient Education  2021 Elsevier Inc. 

## 2022-08-12 ENCOUNTER — Other Ambulatory Visit: Payer: Self-pay

## 2022-08-12 ENCOUNTER — Inpatient Hospital Stay: Payer: Medicare HMO | Attending: Hematology

## 2022-08-12 ENCOUNTER — Other Ambulatory Visit: Payer: Self-pay | Admitting: Thoracic Surgery (Cardiothoracic Vascular Surgery)

## 2022-08-12 DIAGNOSIS — Z5111 Encounter for antineoplastic chemotherapy: Secondary | ICD-10-CM | POA: Insufficient documentation

## 2022-08-12 DIAGNOSIS — Z87891 Personal history of nicotine dependence: Secondary | ICD-10-CM | POA: Insufficient documentation

## 2022-08-12 DIAGNOSIS — Z7984 Long term (current) use of oral hypoglycemic drugs: Secondary | ICD-10-CM | POA: Insufficient documentation

## 2022-08-12 DIAGNOSIS — R590 Localized enlarged lymph nodes: Secondary | ICD-10-CM

## 2022-08-12 DIAGNOSIS — C320 Malignant neoplasm of glottis: Secondary | ICD-10-CM | POA: Insufficient documentation

## 2022-08-12 DIAGNOSIS — Z8572 Personal history of non-Hodgkin lymphomas: Secondary | ICD-10-CM | POA: Insufficient documentation

## 2022-08-12 DIAGNOSIS — Z7982 Long term (current) use of aspirin: Secondary | ICD-10-CM | POA: Insufficient documentation

## 2022-08-12 DIAGNOSIS — Z79899 Other long term (current) drug therapy: Secondary | ICD-10-CM | POA: Insufficient documentation

## 2022-08-12 DIAGNOSIS — Z5112 Encounter for antineoplastic immunotherapy: Secondary | ICD-10-CM | POA: Insufficient documentation

## 2022-08-12 MED FILL — Dexamethasone Sodium Phosphate Inj 100 MG/10ML: INTRAMUSCULAR | Qty: 1 | Status: AC

## 2022-08-12 MED FILL — Fosaprepitant Dimeglumine For IV Infusion 150 MG (Base Eq): INTRAVENOUS | Qty: 5 | Status: AC

## 2022-08-15 ENCOUNTER — Inpatient Hospital Stay (HOSPITAL_BASED_OUTPATIENT_CLINIC_OR_DEPARTMENT_OTHER): Payer: Medicare HMO | Admitting: Hematology

## 2022-08-15 ENCOUNTER — Encounter: Payer: Self-pay | Admitting: Hematology

## 2022-08-15 ENCOUNTER — Inpatient Hospital Stay: Payer: Medicare HMO

## 2022-08-15 VITALS — HR 85

## 2022-08-15 VITALS — BP 141/77 | HR 103 | Temp 97.9°F | Resp 18 | Wt 318.7 lb

## 2022-08-15 DIAGNOSIS — Z7189 Other specified counseling: Secondary | ICD-10-CM | POA: Diagnosis not present

## 2022-08-15 DIAGNOSIS — Z95828 Presence of other vascular implants and grafts: Secondary | ICD-10-CM

## 2022-08-15 DIAGNOSIS — C760 Malignant neoplasm of head, face and neck: Secondary | ICD-10-CM

## 2022-08-15 DIAGNOSIS — Z7982 Long term (current) use of aspirin: Secondary | ICD-10-CM | POA: Diagnosis not present

## 2022-08-15 DIAGNOSIS — Z5112 Encounter for antineoplastic immunotherapy: Secondary | ICD-10-CM | POA: Diagnosis not present

## 2022-08-15 DIAGNOSIS — C320 Malignant neoplasm of glottis: Secondary | ICD-10-CM | POA: Diagnosis not present

## 2022-08-15 DIAGNOSIS — Z5111 Encounter for antineoplastic chemotherapy: Secondary | ICD-10-CM

## 2022-08-15 DIAGNOSIS — C8338 Diffuse large B-cell lymphoma, lymph nodes of multiple sites: Secondary | ICD-10-CM | POA: Diagnosis not present

## 2022-08-15 DIAGNOSIS — Z7984 Long term (current) use of oral hypoglycemic drugs: Secondary | ICD-10-CM | POA: Diagnosis not present

## 2022-08-15 DIAGNOSIS — Z8572 Personal history of non-Hodgkin lymphomas: Secondary | ICD-10-CM | POA: Diagnosis not present

## 2022-08-15 DIAGNOSIS — Z87891 Personal history of nicotine dependence: Secondary | ICD-10-CM | POA: Diagnosis not present

## 2022-08-15 DIAGNOSIS — Z79899 Other long term (current) drug therapy: Secondary | ICD-10-CM | POA: Diagnosis not present

## 2022-08-15 LAB — CBC WITH DIFFERENTIAL (CANCER CENTER ONLY)
Abs Immature Granulocytes: 0.02 10*3/uL (ref 0.00–0.07)
Basophils Absolute: 0 10*3/uL (ref 0.0–0.1)
Basophils Relative: 1 %
Eosinophils Absolute: 0.1 10*3/uL (ref 0.0–0.5)
Eosinophils Relative: 2 %
HCT: 33.6 % — ABNORMAL LOW (ref 39.0–52.0)
Hemoglobin: 11 g/dL — ABNORMAL LOW (ref 13.0–17.0)
Immature Granulocytes: 0 %
Lymphocytes Relative: 42 %
Lymphs Abs: 2.7 10*3/uL (ref 0.7–4.0)
MCH: 28.4 pg (ref 26.0–34.0)
MCHC: 32.7 g/dL (ref 30.0–36.0)
MCV: 86.6 fL (ref 80.0–100.0)
Monocytes Absolute: 0.6 10*3/uL (ref 0.1–1.0)
Monocytes Relative: 9 %
Neutro Abs: 3.1 10*3/uL (ref 1.7–7.7)
Neutrophils Relative %: 46 %
Platelet Count: 328 10*3/uL (ref 150–400)
RBC: 3.88 MIL/uL — ABNORMAL LOW (ref 4.22–5.81)
RDW: 13.5 % (ref 11.5–15.5)
WBC Count: 6.6 10*3/uL (ref 4.0–10.5)
nRBC: 0 % (ref 0.0–0.2)

## 2022-08-15 LAB — CMP (CANCER CENTER ONLY)
ALT: 11 U/L (ref 0–44)
AST: 12 U/L — ABNORMAL LOW (ref 15–41)
Albumin: 4.1 g/dL (ref 3.5–5.0)
Alkaline Phosphatase: 66 U/L (ref 38–126)
Anion gap: 7 (ref 5–15)
BUN: 13 mg/dL (ref 8–23)
CO2: 28 mmol/L (ref 22–32)
Calcium: 9.6 mg/dL (ref 8.9–10.3)
Chloride: 104 mmol/L (ref 98–111)
Creatinine: 0.97 mg/dL (ref 0.61–1.24)
GFR, Estimated: >60 mL/min (ref >60)
Glucose, Bld: 174 mg/dL — ABNORMAL HIGH (ref 70–99)
Potassium: 3.6 mmol/L (ref 3.5–5.1)
Sodium: 139 mmol/L (ref 135–145)
Total Bilirubin: 0.4 mg/dL (ref 0.3–1.2)
Total Protein: 7.4 g/dL (ref 6.5–8.1)

## 2022-08-15 LAB — TSH: TSH: 3.206 u[IU]/mL (ref 0.350–4.500)

## 2022-08-15 MED ORDER — SODIUM CHLORIDE 0.9% FLUSH
10.0000 mL | INTRAVENOUS | Status: DC | PRN
  Administered 2022-08-15: 10 mL

## 2022-08-15 MED ORDER — SODIUM CHLORIDE 0.9 % IV SOLN
200.0000 mg | Freq: Once | INTRAVENOUS | Status: AC
  Administered 2022-08-15: 200 mg via INTRAVENOUS
  Filled 2022-08-15: qty 200

## 2022-08-15 MED ORDER — SODIUM CHLORIDE 0.9 % IV SOLN
10.0000 mg | Freq: Once | INTRAVENOUS | Status: AC
  Administered 2022-08-15: 10 mg via INTRAVENOUS
  Filled 2022-08-15: qty 10

## 2022-08-15 MED ORDER — SODIUM CHLORIDE 0.9 % IV SOLN
600.0000 mg | Freq: Once | INTRAVENOUS | Status: AC
  Administered 2022-08-15: 600 mg via INTRAVENOUS
  Filled 2022-08-15: qty 60

## 2022-08-15 MED ORDER — SODIUM CHLORIDE 0.9 % IV SOLN
800.0000 mg/m2/d | INTRAVENOUS | Status: DC
  Administered 2022-08-15: 8500 mg via INTRAVENOUS
  Filled 2022-08-15: qty 170

## 2022-08-15 MED ORDER — SODIUM CHLORIDE 0.9% FLUSH
10.0000 mL | INTRAVENOUS | Status: AC | PRN
  Administered 2022-08-15: 10 mL

## 2022-08-15 MED ORDER — SODIUM CHLORIDE 0.9 % IV SOLN
150.0000 mg | Freq: Once | INTRAVENOUS | Status: AC
  Administered 2022-08-15: 150 mg via INTRAVENOUS
  Filled 2022-08-15: qty 150

## 2022-08-15 MED ORDER — SODIUM CHLORIDE 0.9 % IV SOLN: Freq: Once | INTRAVENOUS | Status: AC

## 2022-08-15 MED ORDER — PALONOSETRON HCL INJECTION 0.25 MG/5ML
0.2500 mg | Freq: Once | INTRAVENOUS | Status: AC
  Administered 2022-08-15: 0.25 mg via INTRAVENOUS
  Filled 2022-08-15: qty 5

## 2022-08-15 NOTE — Progress Notes (Signed)
Patient called after being told to call me.  Re-introduced myself as Dance movement psychotherapist and to offer available resources. Discussed [employer] grant availability to assist with personal expenses while going through treatment. Advised what is needed to apply and that he may bring at next appointment 08/19/22 and provide at check-in to be scanned and emailed to me. He will then be given grant paperwork to complete and then will call me at earliest convenience to discuss grant expense sheet in detail.  He has my card to do so and for any additional financial questions or concerns.

## 2022-08-15 NOTE — Patient Instructions (Addendum)
[facility]  Discharge Instructions: Thank you for choosing [facility] to provide your oncology and hematology care.   If you have a lab appointment with the Cancer Center, please go directly to the Cancer Center and check in at the registration area.   Wear comfortable clothing and clothing appropriate for easy access to any Portacath or PICC line.   We strive to give you quality time with your provider. You may need to reschedule your appointment if you arrive late (15 or more minutes).  Arriving late affects you and other patients whose appointments are after yours.  Also, if you miss three or more appointments without notifying the office, you may be dismissed from the clinic at the provider's discretion.      For prescription refill requests, have your pharmacy contact our office and allow 72 hours for refills to be completed.    Today you received the following chemotherapy and/or immunotherapy agents: Pembrolizumab (Keytruda)/Carboplatin/Fluorouracil (5FU)   To help prevent nausea and vomiting after your treatment, we encourage you to take your nausea medication as directed.  BELOW ARE SYMPTOMS THAT SHOULD BE REPORTED IMMEDIATELY: *FEVER GREATER THAN 100.4 F (38 C) OR HIGHER *CHILLS OR SWEATING *NAUSEA AND VOMITING THAT IS NOT CONTROLLED WITH YOUR NAUSEA MEDICATION *UNUSUAL SHORTNESS OF BREATH *UNUSUAL BRUISING OR BLEEDING *URINARY PROBLEMS (pain or burning when urinating, or frequent urination) *BOWEL PROBLEMS (unusual diarrhea, constipation, pain near the anus) TENDERNESS IN MOUTH AND THROAT WITH OR WITHOUT PRESENCE OF ULCERS (sore throat, sores in mouth, or a toothache) UNUSUAL RASH, SWELLING OR PAIN  UNUSUAL VAGINAL DISCHARGE OR ITCHING   Items with * indicate a potential emergency and should be followed up as soon as possible or go to the Emergency Department if any problems should occur.  Please show the CHEMOTHERAPY  ALERT CARD or IMMUNOTHERAPY ALERT CARD at check-in to the Emergency Department and triage nurse.  Should you have questions after your visit or need to cancel or reschedule your appointment, please contact [facility]  Dept: [phone]  and follow the prompts.  Office hours are 8:00 a.m. to 4:30 p.m. Monday - Friday. Please note that voicemails left after 4:00 p.m. may not be returned until the following business day.  We are closed weekends and major holidays. You have access to a nurse at all times for urgent questions. Please call the main number to the clinic Dept: [phone] and follow the prompts.   For any non-urgent questions, you may also contact your provider using MyChart. We now offer e-Visits for anyone 79 and older to request care online for non-urgent symptoms. For details visit [email or web address].   Also download the MyChart app! Go to the app store, search "MyChart", open the app, select [facility], and log in with your MyChart username and password.  The chemotherapy medication bag should finish at 46 hours, 96 hours, or 7 days. For example, if your pump is scheduled for 46 hours and it was put on at 4:00 p.m., it should finish at 2:00 p.m. the day it is scheduled to come off regardless of your appointment time.     Estimated time to finish at approximately 12:00 PM on Friday 08/15/2022   If the display on your pump reads "Low Volume" and it is beeping, take the batteries out of the pump and come to the cancer center for it to be taken off.   If the pump alarms go  off prior to the pump reading "Low Volume" then call [phone] and someone can assist you.  If the plunger comes out and the chemotherapy medication is leaking out, please use your home chemo spill kit to clean up the spill. Do NOT use paper towels or other household products.  If you have problems or questions regarding your pump, please call either [phone] (24  hours a day) or the cancer center Monday-Friday 8:00 a.m.- 4:30 p.m. at the clinic number and we will assist you. If you are unable to get assistance, then go to the nearest Emergency Department and ask the staff to contact the IV team for assistance.

## 2022-08-15 NOTE — Progress Notes (Signed)
Patient seen by Dr. Addison Naegeli are within treatment parameters.  Labs reviewed: and are within treatment parameters.  Per physician team, patient is ready for treatment and there are NO modifications to the treatment plan.

## 2022-08-15 NOTE — Progress Notes (Signed)
[facility]   Telephone:([phone] [phone]) [phone]   CLINIC Notes  Date of Service:  08/15/22   Patient Care Team: Johny Blamer, MD as PCP - General (Family Medicine) Wendall Stade, MD as PCP - Cardiology (Cardiology) Lonie Peak, MD as Consulting Physician (Radiation Oncology) Malmfelt, Lise Auer, RN as Oncology Nurse Navigator Osborn Coho, MD as Consulting Physician (Otolaryngology) Johney Maine, MD as Consulting Physician (Hematology)   Date of Service:  08/15/2022  CHIEF COMPLAINTS/PURPOSE OF CONSULTATION:  Continued evaluation and management of newly noted metastatic laryngeal squamous cell carcinoma.  HISTORY OF PRESENTING ILLNESS:   Jesus [name] 67 y.o. male is here because of left lower extremity edema and lymphadenopathy.  The patient was seen in the emergency room this past Friday for the same issue.  A CT of the abdomen and pelvis was performed showing bulky left inguinal, left hemipelvic, and retroperitoneal adenopathy.  He was referred to [place] from the emergency room for further evaluation.  Doppler ultrasound of the left lower extremity was performed and was negative for DVT. Patient reports that he has been having left lower extremity edema in his left groin and left leg for approximately 1 month.  He states that the swelling in the left groin started to get better but then worsened.  The left lower extremity edema has slowly worsened over time.  Patient denies having fevers and chills.  He reports that he does have night sweats at times.  He reported having headaches approximate 1 [place]  He thinks his headaches are related to not having his blood pressure medication.  His headaches have now resolved.  He denies visual changes.  The patient denies chest pain, shortness of breath and cough.  No nausea, vomiting, constipation, diarrhea.  Denies abdominal pain.  The patient denies recent weight loss and  has actually gained weight recently.  Patient denies epistaxis, bleeding gums, hemoptysis, hematuria, but occasionally, and melena.  He reports increased urinary frequency over the past month but no dysuria.  The patient is here for evaluation and discussion of his recent CT and lab findings.  Interval History:  Jesus [name] 67 y.o. male who is here for continued evaluation and management of newly noted metastatic laryngeal squamous cell carcinoma. He is here for day 1 cycle 1 of carbo/5FU/Pembrolizumab.   I had a phone visit with the patient on 07/28/2022 and he complained of increased hoarseness of his voice. His mediastinoscopy and biopsy of paratracheal LN showed metastatic laryngeal SCC.  Patient notes he has been doing well overall since our last visit without any new medical concerns. He notes that he is still having trouble with speech, but denies any pain. He denies smoking cigarettes or using tobacco.   He denies swallowing problem, fever, chills, night sweats, unexpected weight loss, new infection issues, back pain, neck pain, bone pain, abdominal pain, chest pain, or leg swelling.    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- last radiation 06-2018   Cataract    removed both eyes with l;ens implants    COPD (chronic obstructive pulmonary disease)    Family history of colon cancer    in his  brother- dx'd age 47    History of chemotherapy    last 06-01-2018   History of kidney stones    History of radiation therapy    last radiation 06-2018   Hyperlipidemia    currently under control   Hypertension    Irregular heart beats    Lymphadenopathy    Pain, lower leg    Bilateral   Peripheral arterial disease    Pre-diabetes    Red-green color blindness    RLS (restless  legs syndrome)    Snores    Wears glasses     SURGICAL HISTORY: Past Surgical History:  Procedure Laterality Date   CATARACT EXTRACTION W/ INTRAOCULAR LENS  IMPLANT, BILATERAL     COLONOSCOPY     DIRECT LARYNGOSCOPY Right 02/03/2022   Procedure: DIRECT LARYNGOSCOPY WITH BIOPSY OF RIGHT FALSE VOCAL CORD;  Surgeon: Osborn Coho, MD;  Location: [facility] OR;  Service: ENT;  Laterality: Right;   dislodged salava stone     FRACTURE SURGERY     HAND ARTHROPLASTY  1995   crushed left hand   INGUINAL LYMPH NODE BIOPSY Left 01/02/2018   Procedure: LEFT INGUINAL LYMPH NODE BIOPSY;  Surgeon: Emelia Loron, MD;  Location: [facility] OR;  Service: General;  Laterality: Left;   IR IMAGING GUIDED PORT INSERTION  01/15/2018   IR IMAGING GUIDED PORT INSERTION  08/10/2022   IR REMOVAL TUN ACCESS W/ PORT W/O FL MOD SED  03/11/2019   MEDIASTINOSCOPY N/A 07/22/2022   Procedure: MEDIASTINOSCOPY;  Surgeon: Loreli Slot, MD;  Location: [facility] OR;  Service: Thoracic;  Laterality: N/A;   MICROLARYNGOSCOPY Left 01/17/2014   Procedure: MICROLARYNGOSCOPY WITH EXCISION OF THE BIOPSY OF LEFT VOCAL CORD LESION;  Surgeon: Serena Colonel, MD;  Location: [facility];  Service: ENT;  Laterality: Left;   MICROLARYNGOSCOPY N/A 09/16/2020   Procedure: MICROLARYNGOSCOPY with Biopsy of vocal cord lesion;  Surgeon: Osborn Coho, MD;  Location: [facility] OR;  Service: ENT;  Laterality: N/A;   ORIF FOOT FRACTURE  2005   left   REFRACTIVE SURGERY Right    removed cloudiness in right eye after cataract removal     SOCIAL HISTORY: Social History   Socioeconomic History   Marital status: Divorced    Spouse name: Not on file   Number of children: 3   Years of education: Not on file   Highest education level: Not on file  Occupational History   Not on file  Tobacco Use   Smoking status: Former    Packs/day: 0.50    Years: 36.00    Additional pack years: 0.00    Total pack years: 18.00    Types: Cigarettes    Quit  date: 09/28/2020    Years since quitting: 1.8   Smokeless tobacco: Never   Tobacco comments:    he denies smoking in about 2 weeks 07/13/18  Vaping Use   Vaping Use: Never used  Substance and Sexual Activity   Alcohol use: Yes    Alcohol/week: 6.0 standard drinks of alcohol    Types: 6 Cans of beer per week    Comment: weekends   Drug use: Yes    Types: Marijuana, Cocaine    Comment: reports cocaine usage ~2X/ month; last use 07/2022   Sexual activity: Not on file  Other Topics Concern   Not on file  Social History Narrative   Not on file   Social Determinants of Health   Financial Resource Strain: Low Risk  (10/06/2020)   Overall  Financial Resource Strain (CARDIA)    Difficulty of Paying Living Expenses: Not very hard  Food Insecurity: No Food Insecurity (10/06/2020)   Hunger Vital Sign    Worried About Running Out of Food in the Last Year: Never true    Ran Out of Food in the Last Year: Never true  Transportation Needs: No Transportation Needs (10/06/2020)   PRAPARE - Administrator, Civil ServiceTransportation    Lack of Transportation (Medical): No    Lack of Transportation (Non-Medical): No  Physical Activity: Not on file  Stress: Not on file  Social Connections: Moderately Integrated (10/06/2020)   Social Connection and Isolation Panel [NHANES]    Frequency of Communication with Friends and Family: Three times a week    Frequency of Social Gatherings with Friends and Family: Three times a week    Attends Religious Services: 1 to 4 times per year    Active Member of Clubs or Organizations: Yes    Attends BankerClub or Organization Meetings: 1 to 4 times per year    Marital Status: Divorced  Intimate Partner Violence: Not At Risk (07/13/2018)   Humiliation, Afraid, Rape, and Kick questionnaire    Fear of Current or Ex-Partner: No    Emotionally Abused: No    Physically Abused: No    Sexually Abused: No    FAMILY HISTORY: Family History  Problem Relation Age of Onset   Breast cancer Mother    Diabetes  Father    Hypertension Father    Stroke Father    Mental illness Sister    Hypertension Daughter    Mental illness Daughter    Hypertension Brother    Colon cancer Brother [id number]       passed away 11-2018   Breast cancer Sister    Esophageal cancer Neg Hx    Colon polyps Neg Hx    Rectal cancer Neg Hx    Stomach cancer Neg Hx     ALLERGIES:  is allergic to bee venom, antifungal [miconazole nitrate], and zolpidem tartrate er.  MEDICATIONS:  Current Outpatient Medications  Medication Sig Dispense Refill   acetaminophen (TYLENOL) 500 MG tablet Take 1,500 mg by mouth daily as needed for moderate pain.     amLODipine (NORVASC) 10 MG tablet TAKE 1 TABLET BY MOUTH EVERY DAY (Patient taking differently: Take 10 mg by mouth every evening.) 30 tablet 0   aspirin EC 81 MG tablet Take 81 mg by mouth every evening. Swallow whole.     b complex vitamins capsule Take 1 capsule by mouth daily.     dexamethasone (DECADRON) 4 MG tablet Take 2 tablets (8mg ) by mouth daily starting the day after carboplatin for 3 days. Take with food 30 tablet 1   lidocaine-prilocaine (EMLA) cream Apply to affected area once 30 g 3   lisinopril (ZESTRIL) 20 MG tablet TAKE 1 TABLET BY MOUTH EVERY DAY (Patient taking differently: Take 20 mg by mouth every evening.) 30 tablet 0   metFORMIN (GLUCOPHAGE) 500 MG tablet Take 500 mg by mouth daily.     Multiple Vitamins-Minerals (MULTI ADULT GUMMIES PO) Take 1 tablet by mouth in the morning. Centrum     ondansetron (ZOFRAN) 8 MG tablet Take 1 tablet (8 mg total) by mouth every 8 (eight) hours as needed for nausea or vomiting. Start on the third day after carboplatin. 30 tablet 1   oxyCODONE (OXY IR/ROXICODONE) 5 MG immediate release tablet Take 1 tablet (5 mg total) by mouth every 6 (six) hours as needed for moderate pain or  breakthrough pain. 20 tablet 0   prochlorperazine (COMPAZINE) 10 MG tablet Take 1 tablet (10 mg total) by mouth every 6 (six) hours as needed for nausea or  vomiting. 30 tablet 1   tadalafil (CIALIS) 20 MG tablet Take 20 mg by mouth daily as needed for erectile dysfunction.     No current facility-administered medications for this visit.    REVIEW OF SYSTEMS:   10 Point review of Systems was done is negative except as noted above.  PHYSICAL EXAMINATION: .BP (!) 141/77   Pulse (!) 103   Temp 97.9 F (36.6 C)   Resp 18   Wt (!) 318 lb 11.2 oz (144.6 kg)   SpO2 98%   BMI 43.22 kg/m  . GENERAL:alert, in no acute distress and comfortable SKIN: no acute rashes, no significant lesions EYES: conjunctiva are pink and non-injected, sclera anicteric OROPHARYNX: MMM, no exudates, no oropharyngeal erythema or ulceration NECK: supple, no JVD LYMPH:  no palpable lymphadenopathy in the cervical, axillary or inguinal regions LUNGS: clear to auscultation b/l with normal respiratory effort HEART: regular rate & rhythm ABDOMEN:  normoactive bowel sounds , non tender, not distended. Extremity: no pedal edema PSYCH: alert & oriented x 3 with fluent speech NEURO: no focal motor/sensory deficits   LABORATORY DATA:   I have reviewed the data as listed    Latest Ref Rng & Units 08/15/2022    7:57 AM 07/19/2022    2:00 PM 05/18/2022   12:49 PM  CBC  WBC 4.0 - 10.5 K/uL 6.6  6.5  5.5   Hemoglobin 13.0 - 17.0 g/dL 16.1  09.6  04.5   Hematocrit 39.0 - 52.0 % 33.6  37.5  37.0   Platelets 150 - 400 K/uL 328  353  296    CBC    Component Value Date/Time   WBC 6.6 08/15/2022 0757   WBC 6.5 07/19/2022 1400   RBC 3.88 (L) 08/15/2022 0757   HGB 11.0 (L) 08/15/2022 0757   HCT 33.6 (L) 08/15/2022 0757   PLT 328 08/15/2022 0757   MCV 86.6 08/15/2022 0757   MCV 87.3 12/22/2017 1518   MCH 28.4 08/15/2022 0757   MCHC 32.7 08/15/2022 0757   RDW 13.5 08/15/2022 0757   LYMPHSABS 2.7 08/15/2022 0757   MONOABS 0.6 08/15/2022 0757   EOSABS 0.1 08/15/2022 0757   BASOSABS 0.0 08/15/2022 0757       Latest Ref Rng & Units 08/15/2022    7:57 AM 07/19/2022     2:00 PM 05/18/2022   12:49 PM  CMP  Glucose 70 - 99 mg/dL 409  811  914   BUN 8 - 23 mg/dL Creatinine 0.61 - 1.24 mg/dL 7.82  9.56  2.13   Sodium 135 - 145 mmol/L 139  139  136   Potassium 3.5 - 5.1 mmol/L 3.6  4.2  3.8   Chloride 98 - 111 mmol/L 104  104  101   CO2 22 - 32 mmol/L Calcium 8.9 - 10.3 mg/dL 9.6  9.4  9.2   Total Protein 6.5 - 8.1 g/dL 7.4  7.5  7.7   Total Bilirubin 0.3 - 1.2 mg/dL 0.4  0.5  0.4   Alkaline Phos 38 - 126 U/L 66  60  59   AST 15 - 41 U/L ALT 0 - 44 U/L . Lab  Results  Component Value Date   LDH 168 05/18/2022   Tsh 1.42 (05/21/2021)   01/02/18 Left Inguinal LN Bx:   12/26/17 Tissue Flow Cytometry:   12/26/17 Inguinal Core biopsy:      RADIOGRAPHIC STUDIES: I have personally reviewed the radiological images as listed and agreed with the findings in the report. IR IMAGING GUIDED PORT INSERTION  Result Date: 08/10/2022 CLINICAL DATA:  Head and neck carcinoma, needs durable venous access for planned treatment regimen EXAM: TUNNELED PORT CATHETER PLACEMENT WITH ULTRASOUND AND FLUOROSCOPIC GUIDANCE FLUOROSCOPY: Radiation Exposure Index (as provided by the fluoroscopic device): 3 mGy air Kerma ANESTHESIA/SEDATION: Intravenous Fentanyl and Versed 2mg  were administered as conscious sedation during continuous monitoring of the patient's level of consciousness and physiological / cardiorespiratory status by the radiology RN, with a total moderate sedation time of 14 minutes. TECHNIQUE: The procedure, risks, benefits, and alternatives were explained to the patient. Questions regarding the procedure were encouraged and answered. The patient understands and consents to the procedure. Patency of the right IJ vein was confirmed with ultrasound with image documentation. An appropriate skin site was determined. Skin site was marked. Region was prepped using maximum barrier technique including cap and mask,  sterile gown, sterile gloves, large sterile sheet, and Chlorhexidine as cutaneous antisepsis. The region was infiltrated locally with 1% lidocaine. Under real-time ultrasound guidance, the right IJ vein was accessed with a 21 gauge micropuncture needle; the needle tip within the vein was confirmed with ultrasound image documentation. Needle was exchanged over a 018 guidewire for transitional dilator, and vascular measurement was performed. A small incision was made on the right anterior chest wall and a subcutaneous pocket fashioned. The power-injectable port was positioned and its catheter tunneled to the right IJ dermatotomy site. The transitional dilator was exchanged over an Amplatz wire for a peel-away sheath, through which the port catheter, which had been trimmed to the appropriate length, was advanced and positioned under fluoroscopy with its tip at the cavoatrial junction. Spot chest radiograph confirms good catheter position and no pneumothorax. The port was flushed per protocol. The pocket was closed with deep interrupted and subcuticular continuous 3-0 Monocryl sutures. The incisions were covered with Dermabond then covered with a sterile dressing. The patient tolerated the procedure well. COMPLICATIONS: COMPLICATIONS None immediate IMPRESSION: Technically successful right IJ power-injectable port catheter placement. Ready for routine use. Electronically Signed   By: Corlis Leak M.D.   On: 08/10/2022 19:16   DG Chest 2 View  Result Date: 07/21/2022 CLINICAL DATA:  67 year old male preoperative chest x-ray EXAM: CHEST - 2 VIEW COMPARISON:  07/08/2021 FINDINGS: Cardiomediastinal silhouette unchanged in size and contour. No evidence of central vascular congestion. No interlobular septal thickening. No pneumothorax or pleural effusion. Coarsened interstitial markings, with no confluent airspace disease. No acute displaced fracture. Degenerative changes of the spine. IMPRESSION: Negative for acute  cardiopulmonary disease Electronically Signed   By: Gilmer Mor D.O.   On: 07/21/2022 16:12    ASSESSMENT & PLAN:   This is a pleasant 67 y.o. 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   CT of the abdomen and pelvis performed on 12/22/2017 showed bulky left inguinal, left hemipelvic, and  retroperitoneal adenopathy.     01/02/18 Left inguinal LN Biopsy revealed T-Cell/histocyte rich Large B-Cell Lymphoma   12/27/17  ECHO revealed LV EF of 55-60%    01/05/18 PET/CT revealed Massively enlarged pelvic lymph nodes intense metabolic activity consistent lymphoma. 2. Additional hypermetabolic lymph nodes in the porta hepatis and retroperitoneum LEFT aorta. 3. Solitary hypermetabolic mediastinal lymph node in the upper LEFT Mediastinum. 4. 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 5. Normal spleen.     HIV non reactive on 12/22/2017.  Hep C and hep B serology negative.   03/14/18 PET/CT revealed PET-CT findings suggest an excellent response to chemotherapy. The abdominal lymphadenopathy has near completely resolved and demonstrates a near complete metabolic response. The pelvic and inguinal adenopathy has significantly decreased in size and the metabolic activity has significantly decreased. 2. Diffuse marrow activity likely due to chemotherapy and or marrow stimulating drugs. I do not see any discrete persistent lesions.    04/17/18 CT Head revealed Subtle mesial caudothalamic hypodensities may be artifact though, the could reflect encephalitis or Wernicke's encephalopathy. Consider MRI of the head with and without contrast. 2. Mild chronic small vessel ischemic changes    S/p 6 cycles of EPOCH-R  completed on 06/01/18  06/28/18 PET/CT revealed Continued good response to treatment. No residual measurable or hypermetabolic abdominal lymphadenopathy and no recurrent osseous disease. 2. Interval decrease in size of the left operator region lymph node and also the left inguinal lymph node. However, the both have small foci of slightly increased hypermetabolism which bears surveillance. 3. No new or progressive lymphadenopathy in the neck, chest, abdomen or pelvis.  S/p 39.6 Gy in 22 fractions between 07/25/18 and 08/23/18  02/13/2019 CT C/A/P ([id number]) ([id number]) revealed "1. Response to therapy of pelvic adenopathy compared to the PET of 06/28/2018. 2. No new or progressive disease. 3. Mild prostatomegaly. 4. 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 5. Hepatomegaly."   2) left lower extremity swelling- now resolved  Doppler ultrasound for DVT was negative in the left lower extremity.   Likely from venous compression +/- lymphatic obstruction from bulky left inguinal, left hemipelvic, and  retroperitoneal adenopathy.    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  PLAN: -Discussed lab results from today, 08/15/2022, with the patient. CBC showed slightly decreased hemoglobin of 11.0 and slightly decreased hematocrit of 33.6. CMP showed elevated Glucose levels at 174 and elevated AST at 12.  -Discussed the treatment option of  systemic treatment with carbo/5FU/Pembrolizumab and based on PdL1 status and chemo tolerability might transition to pembrolizumab alone. -will consider  palliative RT to any residual disease. -Educated the patient on chemotherapy and immunotherapy.  -repeat CT after cycle 2 and based on results we will consider dropping the chemotherapy and palliative RT. -Patient agrees with the treatment plan.  -patient can proceed with day 1 cycle 1 of his treatment as planned.  -recommended to stay hydrated and exercise.   FOLLOW-UP: RTC with portflush, labs and MD visit with Dr Candise Che in 10-12 days for a toxicity check Plz schedule C2 of Chemo/immunotherapy as orders with portflush, labs and MD visit   The total time spent in the appointment was 30 minutes* .  All of the patient's questions were answered with apparent satisfaction. The patient knows to  call the clinic with any problems, questions or concerns.   Wyvonnia Lora MD MS AAHIVMS [facility] Hematology/Oncology Physician [facility]  .*Total Encounter Time as defined by the Centers for Medicare and Medicaid Services includes, in addition to the face-to-face time of a patient visit (documented in the note above) non-face-to-face time: obtaining and reviewing outside history, ordering and reviewing medications, tests or procedures, care coordination (communications with other health care professionals or caregivers) and documentation in the medical record.   I, Ok Edwards, am acting as a Neurosurgeon for Wyvonnia Lora, MD.  .I have reviewed the above documentation for accuracy and completeness, and I agree with the above. Johney Maine MD

## 2022-08-16 ENCOUNTER — Other Ambulatory Visit: Payer: Self-pay

## 2022-08-16 ENCOUNTER — Ambulatory Visit
Admission: RE | Admit: 2022-08-16 | Discharge: 2022-08-16 | Disposition: A | Discharge status: Home / Self Care | Payer: Medicare HMO | Source: Ambulatory Visit | Attending: Thoracic Surgery (Cardiothoracic Vascular Surgery) | Admitting: Thoracic Surgery (Cardiothoracic Vascular Surgery)

## 2022-08-16 ENCOUNTER — Telehealth: Payer: Self-pay | Admitting: Hematology

## 2022-08-16 ENCOUNTER — Encounter: Payer: Self-pay | Admitting: Hematology

## 2022-08-16 ENCOUNTER — Ambulatory Visit: Payer: Medicare HMO | Admitting: Thoracic Surgery (Cardiothoracic Vascular Surgery)

## 2022-08-16 VITALS — BP 148/93 | HR 86 | Resp 18 | Ht 72.0 in | Wt 328.0 lb

## 2022-08-16 DIAGNOSIS — R590 Localized enlarged lymph nodes: Secondary | ICD-10-CM

## 2022-08-16 DIAGNOSIS — J9811 Atelectasis: Secondary | ICD-10-CM | POA: Diagnosis not present

## 2022-08-16 DIAGNOSIS — I517 Cardiomegaly: Secondary | ICD-10-CM | POA: Diagnosis not present

## 2022-08-16 LAB — T4: T4, Total: 9 ug/dL (ref 4.5–12.0)

## 2022-08-16 NOTE — Telephone Encounter (Signed)
Called pt to see how he did with his recent treatment.  He reports doing well & denies any problems/concerns.  He knows his next appts & how to reach [place] if needed.

## 2022-08-16 NOTE — Telephone Encounter (Signed)
Called patient to confirm new appointments. Left voicemail with new appointment information and contact details if needing to reschedule.  

## 2022-08-16 NOTE — Telephone Encounter (Signed)
-----   Message from Marylouise Stacks, RN sent at 08/15/2022 12:13 PM EDT ----- Regarding: Dr. Candise Che 1st time Keytruda/Carbo/5FU Dr. Candise Che 1st time Keytruda/Carbo/5FU. Pt tolerated tx well without incident. Pt call back due.

## 2022-08-16 NOTE — Progress Notes (Signed)
[address]       [name] [address]             [phone]     HPI: Jesus [name] returns after recent mediastinoscopy.  Jesus [name] is a 67 year old man with a history of diffuse large B-cell lymphoma, squamous cell carcinoma of the larynx, tobacco abuse, obesity, hypertension, hyperlipidemia, and type 2 diabetes.  Treated for non-Hodgkin's lymphoma in 2019.  Treated with radiation for stage I squamous cell carcinoma of the larynx in May 2022.  Was noted to have a right neck mass.  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  Had a PET/CT which also showed an enlarged hypermetabolic left paratracheal node.  He also complained of hoarseness.  I did a mediastinoscopy on 07/22/2022.  He did well and went home on the day of the procedure.  He says the incision itches, but is not painful.  He is starting chemoimmunotherapy.  No radiation currently but may be used in the future.  Current Outpatient Medications  Medication Sig Dispense Refill   acetaminophen (TYLENOL) 500 MG tablet Take 1,500 mg by mouth daily as needed for moderate pain.     amLODipine (NORVASC) 10 MG tablet TAKE 1 TABLET BY MOUTH EVERY DAY (Patient taking differently: Take 10 mg by mouth every evening.) 30 tablet 0   aspirin EC 81 MG tablet Take 81 mg by mouth every evening. Swallow whole.     b complex vitamins capsule Take 1 capsule by mouth daily.     dexamethasone (DECADRON) 4 MG tablet Take 2 tablets (8mg ) by mouth daily starting the day after carboplatin for 3 days. Take with food 30 tablet 1   lidocaine-prilocaine (EMLA) cream Apply to affected area once 30 g 3   lisinopril (ZESTRIL) 20 MG tablet TAKE 1 TABLET BY MOUTH EVERY DAY (Patient taking differently: Take 20 mg by mouth every evening.) 30 tablet 0   metFORMIN (GLUCOPHAGE) 500 MG tablet Take 500 mg by mouth daily.     Multiple Vitamins-Minerals (MULTI ADULT GUMMIES PO) Take 1 tablet by mouth in the morning. Centrum      ondansetron (ZOFRAN) 8 MG tablet Take 1 tablet (8 mg total) by mouth every 8 (eight) hours as needed for nausea or vomiting. Start on the third day after carboplatin. 30 tablet 1   oxyCODONE (OXY IR/ROXICODONE) 5 MG immediate release tablet Take 1 tablet (5 mg total) by mouth every 6 (six) hours as needed for moderate pain or breakthrough pain. 20 tablet 0   prochlorperazine (COMPAZINE) 10 MG tablet Take 1 tablet (10 mg total) by mouth every 6 (six) hours as needed for nausea or vomiting. 30 tablet 1   tadalafil (CIALIS) 20 MG tablet Take 20 mg by mouth daily as needed for erectile dysfunction.     No current facility-administered medications for this visit.    Physical Exam BP (!) 148/93 (BP Location: Left Arm, Patient Position: Sitting)   Pulse 86   Resp 18   Ht 6' (1.829 m)   Wt (!) 328 lb (148.8 kg)   SpO2 93% Comment: rA  BMI 44.32 kg/m  67 year old man in no acute distress Alert and oriented, hoarse voice Incision clean dry and intact  Diagnostic Tests: CHEST - 2 VIEW   COMPARISON:  07/19/2022, PET CT 06/08/2022, CT chest 05/05/2022   FINDINGS: Right-sided central venous port tip at the SVC. Linear atelectasis left base. No acute airspace disease, pleural effusion, or pneumothorax. Tracheal deviation to  the right secondary to left paratracheal opacity corresponding to history of enlarged lymph nodes.   IMPRESSION: 1. Right-sided central venous port with tip over the SVC. 2. Left paratracheal opacity with tracheal deviation to the right corresponding to history of left paratracheal enlarged lymph node 3. Borderline cardiomegaly.  Linear atelectasis left base.     Electronically Signed   By: Jasmine Pang M.D.   On: 08/16/2022 15:31    Impression: Jesus [name] is a 67 year old gentleman treated in 2022 for stage Ia laryngeal cancer who now has recurrence with metastatic disease and lymph nodes in his neck and mediastinum.  Underwent mediastinoscopy about 3 weeks  ago.  No complications.  He is doing well.  He is already started treatment with chemoimmunotherapy.  Plan: I will be happy to see Jesus [name] back anytime in the future if I can be of any further assistance with his care  Loreli Slot, MD [facility] [phone]

## 2022-08-17 ENCOUNTER — Encounter: Payer: Self-pay | Admitting: Podiatry

## 2022-08-17 ENCOUNTER — Other Ambulatory Visit: Payer: Self-pay

## 2022-08-17 ENCOUNTER — Ambulatory Visit: Payer: Medicare HMO | Admitting: Podiatry

## 2022-08-17 DIAGNOSIS — M79676 Pain in unspecified toe(s): Secondary | ICD-10-CM

## 2022-08-17 DIAGNOSIS — B351 Tinea unguium: Secondary | ICD-10-CM | POA: Diagnosis not present

## 2022-08-17 NOTE — Patient Instructions (Signed)

## 2022-08-17 NOTE — Progress Notes (Signed)
Subjective:   Patient ID: Jesus [name], male   DOB: 67 y.o.   MRN: [id number]   HPI Patient presents stating that he has painful nails 1-5 both feet and that he is being treated currently for cancer and cannot have anything invasive   ROS      Objective:  Physical Exam  Neurovascular status intact with thick toenails 1-5 both feet thickened incurvated with the hallux nails being chronically ingrown all of them painful     Assessment:  Chronic mycotic nail infection 1-5 both feet with pain     Plan:  Debridement of nailbeds 1-5 both feet no angiogenic bleeding reappoint to recheck

## 2022-08-18 ENCOUNTER — Other Ambulatory Visit: Payer: Self-pay

## 2022-08-18 DIAGNOSIS — C760 Malignant neoplasm of head, face and neck: Secondary | ICD-10-CM

## 2022-08-19 ENCOUNTER — Inpatient Hospital Stay: Payer: Medicare HMO

## 2022-08-19 VITALS — BP 125/72 | HR 100 | Temp 98.8°F | Resp 20

## 2022-08-19 DIAGNOSIS — Z5112 Encounter for antineoplastic immunotherapy: Secondary | ICD-10-CM | POA: Diagnosis not present

## 2022-08-19 DIAGNOSIS — Z87891 Personal history of nicotine dependence: Secondary | ICD-10-CM | POA: Diagnosis not present

## 2022-08-19 DIAGNOSIS — Z7984 Long term (current) use of oral hypoglycemic drugs: Secondary | ICD-10-CM | POA: Diagnosis not present

## 2022-08-19 DIAGNOSIS — Z7982 Long term (current) use of aspirin: Secondary | ICD-10-CM | POA: Diagnosis not present

## 2022-08-19 DIAGNOSIS — Z8572 Personal history of non-Hodgkin lymphomas: Secondary | ICD-10-CM | POA: Diagnosis not present

## 2022-08-19 DIAGNOSIS — Z79899 Other long term (current) drug therapy: Secondary | ICD-10-CM | POA: Diagnosis not present

## 2022-08-19 DIAGNOSIS — C320 Malignant neoplasm of glottis: Secondary | ICD-10-CM | POA: Diagnosis not present

## 2022-08-19 DIAGNOSIS — Z5111 Encounter for antineoplastic chemotherapy: Secondary | ICD-10-CM | POA: Diagnosis not present

## 2022-08-19 DIAGNOSIS — Z7189 Other specified counseling: Secondary | ICD-10-CM

## 2022-08-19 DIAGNOSIS — C760 Malignant neoplasm of head, face and neck: Secondary | ICD-10-CM

## 2022-08-19 LAB — URINALYSIS, COMPLETE (UACMP) WITH MICROSCOPIC
Bilirubin Urine: NEGATIVE
Glucose, UA: 50 mg/dL — AB
Hgb urine dipstick: NEGATIVE
Ketones, ur: NEGATIVE mg/dL
Leukocytes,Ua: NEGATIVE
Nitrite: NEGATIVE
Protein, ur: 30 mg/dL — AB
Specific Gravity, Urine: 1.021 (ref 1.005–1.030)
pH: 5 (ref 5.0–8.0)

## 2022-08-19 MED ORDER — SODIUM CHLORIDE 0.9% FLUSH
10.0000 mL | INTRAVENOUS | Status: DC | PRN
  Administered 2022-08-19: 10 mL

## 2022-08-19 MED ORDER — HEPARIN SOD (PORK) LOCK FLUSH 100 UNIT/ML IV SOLN
500.0000 [IU] | Freq: Once | INTRAVENOUS | Status: AC | PRN
  Administered 2022-08-19: 500 [IU]

## 2022-08-20 LAB — URINE CULTURE: Culture: NO GROWTH

## 2022-08-21 ENCOUNTER — Encounter: Payer: Self-pay | Admitting: Hematology

## 2022-08-26 ENCOUNTER — Other Ambulatory Visit: Payer: Self-pay

## 2022-08-26 DIAGNOSIS — C760 Malignant neoplasm of head, face and neck: Secondary | ICD-10-CM

## 2022-08-29 ENCOUNTER — Inpatient Hospital Stay: Payer: Medicare HMO

## 2022-08-29 ENCOUNTER — Inpatient Hospital Stay (HOSPITAL_BASED_OUTPATIENT_CLINIC_OR_DEPARTMENT_OTHER): Payer: Medicare HMO | Admitting: Hematology

## 2022-08-29 VITALS — BP 101/84 | HR 84 | Temp 97.9°F | Resp 20 | Wt 314.5 lb

## 2022-08-29 DIAGNOSIS — Z79899 Other long term (current) drug therapy: Secondary | ICD-10-CM | POA: Diagnosis not present

## 2022-08-29 DIAGNOSIS — Z87891 Personal history of nicotine dependence: Secondary | ICD-10-CM | POA: Diagnosis not present

## 2022-08-29 DIAGNOSIS — Z7984 Long term (current) use of oral hypoglycemic drugs: Secondary | ICD-10-CM | POA: Diagnosis not present

## 2022-08-29 DIAGNOSIS — Z7982 Long term (current) use of aspirin: Secondary | ICD-10-CM | POA: Diagnosis not present

## 2022-08-29 DIAGNOSIS — Z5112 Encounter for antineoplastic immunotherapy: Secondary | ICD-10-CM | POA: Diagnosis not present

## 2022-08-29 DIAGNOSIS — C760 Malignant neoplasm of head, face and neck: Secondary | ICD-10-CM

## 2022-08-29 DIAGNOSIS — Z8572 Personal history of non-Hodgkin lymphomas: Secondary | ICD-10-CM | POA: Diagnosis not present

## 2022-08-29 DIAGNOSIS — Z5111 Encounter for antineoplastic chemotherapy: Secondary | ICD-10-CM | POA: Diagnosis not present

## 2022-08-29 DIAGNOSIS — Z95828 Presence of other vascular implants and grafts: Secondary | ICD-10-CM

## 2022-08-29 DIAGNOSIS — C320 Malignant neoplasm of glottis: Secondary | ICD-10-CM | POA: Diagnosis not present

## 2022-08-29 LAB — CMP (CANCER CENTER ONLY)
ALT: 9 U/L (ref 0–44)
AST: 9 U/L — ABNORMAL LOW (ref 15–41)
Albumin: 4 g/dL (ref 3.5–5.0)
Alkaline Phosphatase: 74 U/L (ref 38–126)
Anion gap: 7 (ref 5–15)
BUN: 16 mg/dL (ref 8–23)
CO2: 31 mmol/L (ref 22–32)
Calcium: 10 mg/dL (ref 8.9–10.3)
Chloride: 99 mmol/L (ref 98–111)
Creatinine: 1.1 mg/dL (ref 0.61–1.24)
GFR, Estimated: >60 mL/min (ref >60)
Glucose, Bld: 247 mg/dL — ABNORMAL HIGH (ref 70–99)
Potassium: 4.1 mmol/L (ref 3.5–5.1)
Sodium: 137 mmol/L (ref 135–145)
Total Bilirubin: 0.4 mg/dL (ref 0.3–1.2)
Total Protein: 7.1 g/dL (ref 6.5–8.1)

## 2022-08-29 LAB — CBC WITH DIFFERENTIAL (CANCER CENTER ONLY)
Abs Immature Granulocytes: 0.05 10*3/uL (ref 0.00–0.07)
Basophils Absolute: 0.1 10*3/uL (ref 0.0–0.1)
Basophils Relative: 1 %
Eosinophils Absolute: 0.1 10*3/uL (ref 0.0–0.5)
Eosinophils Relative: 1 %
HCT: 32.3 % — ABNORMAL LOW (ref 39.0–52.0)
Hemoglobin: 11 g/dL — ABNORMAL LOW (ref 13.0–17.0)
Immature Granulocytes: 1 %
Lymphocytes Relative: 33 %
Lymphs Abs: 3 10*3/uL (ref 0.7–4.0)
MCH: 29.3 pg (ref 26.0–34.0)
MCHC: 34.1 g/dL (ref 30.0–36.0)
MCV: 85.9 fL (ref 80.0–100.0)
Monocytes Absolute: 0.7 10*3/uL (ref 0.1–1.0)
Monocytes Relative: 8 %
Neutro Abs: 5.2 10*3/uL (ref 1.7–7.7)
Neutrophils Relative %: 56 %
Platelet Count: 335 10*3/uL (ref 150–400)
RBC: 3.76 MIL/uL — ABNORMAL LOW (ref 4.22–5.81)
RDW: 13.9 % (ref 11.5–15.5)
WBC Count: 9.1 10*3/uL (ref 4.0–10.5)
nRBC: 0 % (ref 0.0–0.2)

## 2022-08-29 MED ORDER — HEPARIN SOD (PORK) LOCK FLUSH 100 UNIT/ML IV SOLN
500.0000 [IU] | INTRAVENOUS | Status: AC | PRN
  Administered 2022-08-29: 500 [IU]

## 2022-08-29 MED ORDER — SODIUM CHLORIDE 0.9% FLUSH
10.0000 mL | INTRAVENOUS | Status: AC | PRN
  Administered 2022-08-29: 10 mL

## 2022-08-29 NOTE — Progress Notes (Signed)
[facility]   [phone] [phone]   CLINIC Notes  Date of Service:  08/29/22   Patient Care Team: Johny Blamer, MD as PCP - General (Family Medicine) Wendall Stade, MD as PCP - Cardiology (Cardiology) Lonie Peak, MD as Consulting Physician (Radiation Oncology) Malmfelt, Lise Auer, RN as Oncology Nurse Navigator Osborn Coho, MD as Consulting Physician (Otolaryngology) Johney Maine, MD as Attending Physician (Hematology)   Date of Service:  08/29/2022  CHIEF COMPLAINTS/PURPOSE OF CONSULTATION:  Continued evaluation and management of newly noted metastatic laryngeal squamous cell carcinoma.  HISTORY OF PRESENTING ILLNESS:   Jesus [name] 67 y.o. male is here because of left lower extremity edema and lymphadenopathy.  The patient was seen in the emergency room this past Friday for the same issue.  A CT of the abdomen and pelvis was performed showing bulky left inguinal, left hemipelvic, and retroperitoneal adenopathy.  He was referred to [place] from the emergency room for further evaluation.  Doppler ultrasound of the left lower extremity was performed and was negative for DVT. Patient reports that he has been having left lower extremity edema in his left groin and left leg for approximately 1 month.  He states that the swelling in the left groin started to get better but then worsened.  The left lower extremity edema has slowly worsened over time.  Patient denies having fevers and chills.  He reports that he does have night sweats at times.  He reported having headaches approximate 1 [place]  He thinks his headaches are related to not having his blood pressure medication.  His headaches have now resolved.  He denies visual changes.  The patient denies chest pain, shortness of breath and cough.  No nausea, vomiting, constipation, diarrhea.  Denies abdominal pain.  The patient denies recent weight loss and  has actually gained weight recently.  Patient denies epistaxis, bleeding gums, hemoptysis, hematuria, but occasionally, and melena.  He reports increased urinary frequency over the past month but no dysuria.  The patient is here for evaluation and discussion of his recent CT and lab findings.  Interval History:  Jesus [name] 67 y.o. male who is here for continued evaluation and management of newly noted metastatic laryngeal squamous cell carcinoma. He is scheduled for cycle 2 day 1 of carbo/5FU/Pembrolizumab. He is here for toxicity check after cycle 1.   Patient was last seen by me on 08/15/2022 and he was doing well overall.   Patient notes he has been doing well overall since our last visit. He notes that he tolerated his first cycle of his treatment well without any severe toxicities, other than nose soreness and mild fatigue. He denies fever, chills, night sweats, nausea, vomiting, back pain, chest pain, abdominal pain, or leg swelling.   Patient notes his taste has been different since he started his treatment.   His blood sugar levels are elevated during this visit at 247. He is taking Metformin 500 mg.    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- last radiation 06-2018   Cataract    removed both eyes with l;ens implants    COPD (chronic obstructive pulmonary disease)  Family history of colon cancer    in his brother- dx'd age 23    History of chemotherapy    last 06-01-2018   History of kidney stones    History of radiation therapy    last radiation 06-2018   Hyperlipidemia    currently under control   Hypertension    Irregular heart beats    Lymphadenopathy    Pain, lower leg    Bilateral   Peripheral arterial disease    Pre-diabetes    Red-green color  blindness    RLS (restless legs syndrome)    Snores    Wears glasses     SURGICAL HISTORY: Past Surgical History:  Procedure Laterality Date   CATARACT EXTRACTION W/ INTRAOCULAR LENS  IMPLANT, BILATERAL     COLONOSCOPY     DIRECT LARYNGOSCOPY Right 02/03/2022   Procedure: DIRECT LARYNGOSCOPY WITH BIOPSY OF RIGHT FALSE VOCAL CORD;  Surgeon: Osborn Coho, MD;  Location: [facility] OR;  Service: ENT;  Laterality: Right;   dislodged salava stone     FRACTURE SURGERY     HAND ARTHROPLASTY  1995   crushed left hand   INGUINAL LYMPH NODE BIOPSY Left 01/02/2018   Procedure: LEFT INGUINAL LYMPH NODE BIOPSY;  Surgeon: Emelia Loron, MD;  Location: [facility] OR;  Service: General;  Laterality: Left;   IR IMAGING GUIDED PORT INSERTION  01/15/2018   IR IMAGING GUIDED PORT INSERTION  08/10/2022   IR REMOVAL TUN ACCESS W/ PORT W/O FL MOD SED  03/11/2019   MEDIASTINOSCOPY N/A 07/22/2022   Procedure: MEDIASTINOSCOPY;  Surgeon: Loreli Slot, MD;  Location: [facility] OR;  Service: Thoracic;  Laterality: N/A;   MICROLARYNGOSCOPY Left 01/17/2014   Procedure: MICROLARYNGOSCOPY WITH EXCISION OF THE BIOPSY OF LEFT VOCAL CORD LESION;  Surgeon: Serena Colonel, MD;  Location: [facility];  Service: ENT;  Laterality: Left;   MICROLARYNGOSCOPY N/A 09/16/2020   Procedure: MICROLARYNGOSCOPY with Biopsy of vocal cord lesion;  Surgeon: Osborn Coho, MD;  Location: [facility] OR;  Service: ENT;  Laterality: N/A;   ORIF FOOT FRACTURE  2005   left   REFRACTIVE SURGERY Right    removed cloudiness in right eye after cataract removal     SOCIAL HISTORY: Social History   Socioeconomic History   Marital status: Divorced    Spouse name: Not on file   Number of children: 3   Years of education: Not on file   Highest education level: Not on file  Occupational History   Not on file  Tobacco Use   Smoking status: Former    Packs/day: 0.50    Years: 36.00    Additional pack years: 0.00    Total pack years: 18.00     Types: Cigarettes    Quit date: 09/28/2020    Years since quitting: 1.9   Smokeless tobacco: Never   Tobacco comments:    he denies smoking in about 2 weeks 07/13/18  Vaping Use   Vaping Use: Never used  Substance and Sexual Activity   Alcohol use: Yes    Alcohol/week: 6.0 standard drinks of alcohol    Types: 6 Cans of beer per week    Comment: weekends   Drug use: Yes    Types: Marijuana, Cocaine    Comment: reports cocaine usage ~2X/ month; last use 07/2022   Sexual activity: Not on file  Other Topics Concern   Not on file  Social History Narrative   Not on file   Social Determinants of Health  Financial Resource Strain: Low Risk  (10/06/2020)   Overall Financial Resource Strain (CARDIA)    Difficulty of Paying Living Expenses: Not very hard  Food Insecurity: No Food Insecurity (10/06/2020)   Hunger Vital Sign    Worried About Running Out of Food in the Last Year: Never true    Ran Out of Food in the Last Year: Never true  Transportation Needs: No Transportation Needs (10/06/2020)   PRAPARE - Administrator, Civil Service (Medical): No    Lack of Transportation (Non-Medical): No  Physical Activity: Not on file  Stress: Not on file  Social Connections: Moderately Integrated (10/06/2020)   Social Connection and Isolation Panel [NHANES]    Frequency of Communication with Friends and Family: Three times a week    Frequency of Social Gatherings with Friends and Family: Three times a week    Attends Religious Services: 1 to 4 times per year    Active Member of Clubs or Organizations: Yes    Attends Banker Meetings: 1 to 4 times per year    Marital Status: Divorced  Intimate Partner Violence: Not At Risk (07/13/2018)   Humiliation, Afraid, Rape, and Kick questionnaire    Fear of Current or Ex-Partner: No    Emotionally Abused: No    Physically Abused: No    Sexually Abused: No    FAMILY HISTORY: Family History  Problem Relation Age of Onset   Breast  cancer Mother    Diabetes Father    Hypertension Father    Stroke Father    Mental illness Sister    Hypertension Daughter    Mental illness Daughter    Hypertension Brother    Colon cancer Brother 2       passed away 2018-12-10   Breast cancer Sister    Esophageal cancer Neg Hx    Colon polyps Neg Hx    Rectal cancer Neg Hx    Stomach cancer Neg Hx     ALLERGIES:  is allergic to bee venom, antifungal [miconazole nitrate], and zolpidem tartrate er.  MEDICATIONS:  Current Outpatient Medications  Medication Sig Dispense Refill   acetaminophen (TYLENOL) 500 MG tablet Take 1,500 mg by mouth daily as needed for moderate pain.     amLODipine (NORVASC) 10 MG tablet TAKE 1 TABLET BY MOUTH EVERY DAY (Patient taking differently: Take 10 mg by mouth every evening.) 30 tablet 0   aspirin EC 81 MG tablet Take 81 mg by mouth every evening. Swallow whole.     b complex vitamins capsule Take 1 capsule by mouth daily.     dexamethasone (DECADRON) 4 MG tablet Take 2 tablets (8mg ) by mouth daily starting the day after carboplatin for 3 days. Take with food 30 tablet 1   lidocaine-prilocaine (EMLA) cream Apply to affected area once 30 g 3   lisinopril (ZESTRIL) 20 MG tablet TAKE 1 TABLET BY MOUTH EVERY DAY (Patient taking differently: Take 20 mg by mouth every evening.) 30 tablet 0   metFORMIN (GLUCOPHAGE) 500 MG tablet Take 500 mg by mouth daily.     Multiple Vitamins-Minerals (MULTI ADULT GUMMIES PO) Take 1 tablet by mouth in the morning. Centrum     ondansetron (ZOFRAN) 8 MG tablet Take 1 tablet (8 mg total) by mouth every 8 (eight) hours as needed for nausea or vomiting. Start on the third day after carboplatin. 30 tablet 1   oxyCODONE (OXY IR/ROXICODONE) 5 MG immediate release tablet Take 1 tablet (5 mg total) by mouth  every 6 (six) hours as needed for moderate pain or breakthrough pain. 20 tablet 0   prochlorperazine (COMPAZINE) 10 MG tablet Take 1 tablet (10 mg total) by mouth every 6 (six) hours as  needed for nausea or vomiting. 30 tablet 1   tadalafil (CIALIS) 20 MG tablet Take 20 mg by mouth daily as needed for erectile dysfunction.     No current facility-administered medications for this visit.    REVIEW OF SYSTEMS:   10 Point review of Systems was done is negative except as noted above.  PHYSICAL EXAMINATION: .BP 101/84   Pulse 84   Temp 97.9 F (36.6 C)   Resp 20   Wt (!) 314 lb 8 oz (142.7 kg)   SpO2 97%   BMI 42.65 kg/m  . GENERAL:alert, in no acute distress and comfortable SKIN: no acute rashes, no significant lesions EYES: conjunctiva are pink and non-injected, sclera anicteric OROPHARYNX: MMM, no exudates, no oropharyngeal erythema or ulceration NECK: supple, no JVD LYMPH:  no palpable lymphadenopathy in the cervical, axillary or inguinal regions LUNGS: clear to auscultation b/l with normal respiratory effort HEART: regular rate & rhythm ABDOMEN:  normoactive bowel sounds , non tender, not distended. Extremity: no pedal edema PSYCH: alert & oriented x 3 with fluent speech NEURO: no focal motor/sensory deficits   LABORATORY DATA:   I have reviewed the data as listed    Latest Ref Rng & Units 08/29/2022    2:28 PM 08/15/2022    7:57 AM 07/19/2022    2:00 PM  CBC  WBC 4.0 - 10.5 K/uL 9.1  6.6  6.5   Hemoglobin 13.0 - 17.0 g/dL 40.9  81.1  91.4   Hematocrit 39.0 - 52.0 % 32.3  33.6  37.5   Platelets 150 - 400 K/uL 335  328  353    CBC    Component Value Date/Time   WBC 9.1 08/29/2022 1428   WBC 6.5 07/19/2022 1400   RBC 3.76 (L) 08/29/2022 1428   HGB 11.0 (L) 08/29/2022 1428   HCT 32.3 (L) 08/29/2022 1428   PLT 335 08/29/2022 1428   MCV 85.9 08/29/2022 1428   MCV 87.3 12/22/2017 1518   MCH 29.3 08/29/2022 1428   MCHC 34.1 08/29/2022 1428   RDW 13.9 08/29/2022 1428   LYMPHSABS 3.0 08/29/2022 1428   MONOABS 0.7 08/29/2022 1428   EOSABS 0.1 08/29/2022 1428   BASOSABS 0.1 08/29/2022 1428       Latest Ref Rng & Units 08/29/2022    2:28 PM  08/15/2022    7:57 AM 07/19/2022    2:00 PM  CMP  Glucose 70 - 99 mg/dL 782  956  213   BUN 8 - 23 mg/dL 16  13  11    Creatinine 0.61 - 1.24 mg/dL 0.86  5.78  4.69   Sodium 135 - 145 mmol/L 137  139  139   Potassium 3.5 - 5.1 mmol/L 4.1  3.6  4.2   Chloride 98 - 111 mmol/L 99  104  104   CO2 22 - 32 mmol/L 31  28  28    Calcium 8.9 - 10.3 mg/dL 62.9  9.6  9.4   Total Protein 6.5 - 8.1 g/dL 7.1  7.4  7.5   Total Bilirubin 0.3 - 1.2 mg/dL 0.4  0.4  0.5   Alkaline Phos 38 - 126 U/L 74  66  60   AST 15 - 41 U/L 9  12  16    ALT 0 - 44 U/L  9  11  11     . Lab Results  Component Value Date   LDH 168 05/18/2022   Tsh 1.42 (05/21/2021)   01/02/18 Left Inguinal LN Bx:   12/26/17 Tissue Flow Cytometry:   12/26/17 Inguinal Core biopsy:      RADIOGRAPHIC STUDIES: I have personally reviewed the radiological images as listed and agreed with the findings in the report. DG Chest 2 View  Result Date: 08/16/2022 CLINICAL DATA:  Paratracheal adenopathy EXAM: CHEST - 2 VIEW COMPARISON:  07/19/2022, PET CT 06/08/2022, CT chest 05/05/2022 FINDINGS: Right-sided central venous port tip at the SVC. Linear atelectasis left base. No acute airspace disease, pleural effusion, or pneumothorax. Tracheal deviation to the right secondary to left paratracheal opacity corresponding to history of enlarged lymph nodes. IMPRESSION: 1. Right-sided central venous port with tip over the SVC. 2. Left paratracheal opacity with tracheal deviation to the right corresponding to history of left paratracheal enlarged lymph node 3. Borderline cardiomegaly.  Linear atelectasis left base. Electronically Signed   By: Jasmine Pang M.D.   On: 08/16/2022 15:31   IR IMAGING GUIDED PORT INSERTION  Result Date: 08/10/2022 CLINICAL DATA:  Head and neck carcinoma, needs durable venous access for planned treatment regimen EXAM: TUNNELED PORT CATHETER PLACEMENT WITH ULTRASOUND AND FLUOROSCOPIC GUIDANCE FLUOROSCOPY: Radiation Exposure Index  (as provided by the fluoroscopic device): 3 mGy air Kerma ANESTHESIA/SEDATION: Intravenous Fentanyl and Versed 2mg  were administered as conscious sedation during continuous monitoring of the patient's level of consciousness and physiological / cardiorespiratory status by the radiology RN, with a total moderate sedation time of 14 minutes. TECHNIQUE: The procedure, risks, benefits, and alternatives were explained to the patient. Questions regarding the procedure were encouraged and answered. The patient understands and consents to the procedure. Patency of the right IJ vein was confirmed with ultrasound with image documentation. An appropriate skin site was determined. Skin site was marked. Region was prepped using maximum barrier technique including cap and mask, sterile gown, sterile gloves, large sterile sheet, and Chlorhexidine as cutaneous antisepsis. The region was infiltrated locally with 1% lidocaine. Under real-time ultrasound guidance, the right IJ vein was accessed with a 21 gauge micropuncture needle; the needle tip within the vein was confirmed with ultrasound image documentation. Needle was exchanged over a 018 guidewire for transitional dilator, and vascular measurement was performed. A small incision was made on the right anterior chest wall and a subcutaneous pocket fashioned. The power-injectable port was positioned and its catheter tunneled to the right IJ dermatotomy site. The transitional dilator was exchanged over an Amplatz wire for a peel-away sheath, through which the port catheter, which had been trimmed to the appropriate length, was advanced and positioned under fluoroscopy with its tip at the cavoatrial junction. Spot chest radiograph confirms good catheter position and no pneumothorax. The port was flushed per protocol. The pocket was closed with deep interrupted and subcuticular continuous 3-0 Monocryl sutures. The incisions were covered with Dermabond then covered with a sterile  dressing. The patient tolerated the procedure well. COMPLICATIONS: COMPLICATIONS None immediate IMPRESSION: Technically successful right IJ power-injectable port catheter placement. Ready for routine use. Electronically Signed   By: Corlis Leak M.D.   On: 08/10/2022 19:16    ASSESSMENT & PLAN:   This is a pleasant 67 y.o. 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   CT of the abdomen and pelvis performed on 12/22/2017 showed bulky left inguinal, left hemipelvic, and  retroperitoneal adenopathy.     01/02/18 Left inguinal LN Biopsy revealed T-Cell/histocyte rich Large B-Cell Lymphoma   12/27/17 ECHO revealed LV EF of 55-60%    01/05/18 PET/CT revealed Massively enlarged pelvic lymph nodes intense metabolic activity consistent lymphoma. 2. Additional hypermetabolic lymph nodes in the porta hepatis and retroperitoneum LEFT aorta. 3. Solitary hypermetabolic mediastinal lymph node in the upper LEFT Mediastinum. 4. 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 5. Normal spleen.     HIV non reactive on 12/22/2017.  Hep C and hep B serology negative.   03/14/18 PET/CT revealed PET-CT findings suggest an excellent response to chemotherapy. The abdominal lymphadenopathy has near completely resolved and demonstrates a near complete metabolic response. The pelvic and inguinal adenopathy has significantly decreased in size and the metabolic activity has significantly decreased. 2. Diffuse marrow activity likely due to chemotherapy and or marrow stimulating drugs. I do not see any discrete persistent lesions.    04/17/18 CT Head revealed Subtle mesial caudothalamic hypodensities may be artifact though, the could reflect  encephalitis or Wernicke's encephalopathy. Consider MRI of the head with and without contrast. 2. Mild chronic small vessel ischemic changes    S/p 6 cycles of EPOCH-R completed on 06/01/18  06/28/18 PET/CT revealed Continued good response to treatment. No residual measurable or hypermetabolic abdominal lymphadenopathy and no recurrent osseous disease. 2. Interval decrease in size of the left operator region lymph node and also the left inguinal lymph node. However, the both have small foci of slightly increased hypermetabolism which bears surveillance. 3. No new or progressive lymphadenopathy in the neck, chest, abdomen or pelvis.  S/p 39.6 Gy in 22 fractions between 07/25/18 and 08/23/18  02/13/2019 CT C/A/P ([id number]) ([id number]) revealed "1. Response to therapy of pelvic adenopathy compared to the PET of 06/28/2018. 2. No new or progressive disease. 3. Mild prostatomegaly. 4. 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 5. Hepatomegaly."   2) left lower extremity swelling- now resolved  Doppler ultrasound for DVT was negative in the left lower extremity.   Likely from venous compression +/- lymphatic obstruction from bulky left inguinal, left hemipelvic, and  retroperitoneal adenopathy.    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  PLAN: -Discussed lab results from today, 08/29/2022, with the patient. CBC shows slightly decreased hemoglobin at 11.0 and decreased hematocrit of 32.3. CMP shows elevated glucose level of 247. -Patient tolerated his first cycle of his treatment well  with mild toxicity of nose soreness and mild fatigue.  -Patient can proceed with cycle 2 of his treatment as planned without any dose modification.  -Recommended to follow-up with PCP regarding elevated blood sugar level.  -recommended to stay hydrated and exercise.    FOLLOW-UP: Per integrated scheduling  The total time spent in the appointment was 23 minutes* .  All of the patient's questions were answered with apparent satisfaction. The patient knows to call the clinic with any problems, questions or concerns.   Wyvonnia Lora MD MS AAHIVMS [facility] Hematology/Oncology Physician [facility]  .*Total Encounter Time as defined by the Centers for Medicare and Medicaid Services includes, in addition to the face-to-face time of a patient visit (documented in the note above) non-face-to-face time: obtaining and reviewing outside history, ordering and reviewing medications, tests or procedures, care coordination (communications with other health care professionals or caregivers) and documentation in the medical record.   I, Ok Edwards, am acting as a Neurosurgeon for Wyvonnia Lora, MD. .I have reviewed the above documentation for accuracy and completeness, and I agree with the above. Johney Maine MD

## 2022-08-31 DIAGNOSIS — E119 Type 2 diabetes mellitus without complications: Secondary | ICD-10-CM | POA: Diagnosis not present

## 2022-08-31 DIAGNOSIS — C329 Malignant neoplasm of larynx, unspecified: Secondary | ICD-10-CM | POA: Diagnosis not present

## 2022-09-02 MED FILL — Dexamethasone Sodium Phosphate Inj 100 MG/10ML: INTRAMUSCULAR | Qty: 1 | Status: AC

## 2022-09-02 MED FILL — Fosaprepitant Dimeglumine For IV Infusion 150 MG (Base Eq): INTRAVENOUS | Qty: 5 | Status: AC

## 2022-09-05 ENCOUNTER — Inpatient Hospital Stay: Payer: Medicare HMO

## 2022-09-05 ENCOUNTER — Encounter: Payer: Self-pay | Admitting: Hematology

## 2022-09-05 ENCOUNTER — Inpatient Hospital Stay (HOSPITAL_BASED_OUTPATIENT_CLINIC_OR_DEPARTMENT_OTHER): Payer: Medicare HMO | Admitting: Hematology

## 2022-09-05 VITALS — BP 134/85 | HR 83 | Temp 98.2°F | Resp 20 | Wt 309.6 lb

## 2022-09-05 VITALS — BP 123/75 | HR 85 | Temp 98.1°F | Resp 18

## 2022-09-05 DIAGNOSIS — Z87891 Personal history of nicotine dependence: Secondary | ICD-10-CM | POA: Diagnosis not present

## 2022-09-05 DIAGNOSIS — Z5111 Encounter for antineoplastic chemotherapy: Secondary | ICD-10-CM | POA: Diagnosis not present

## 2022-09-05 DIAGNOSIS — C760 Malignant neoplasm of head, face and neck: Secondary | ICD-10-CM

## 2022-09-05 DIAGNOSIS — Z95828 Presence of other vascular implants and grafts: Secondary | ICD-10-CM | POA: Insufficient documentation

## 2022-09-05 DIAGNOSIS — Z7982 Long term (current) use of aspirin: Secondary | ICD-10-CM | POA: Diagnosis not present

## 2022-09-05 DIAGNOSIS — Z7189 Other specified counseling: Secondary | ICD-10-CM

## 2022-09-05 DIAGNOSIS — Z5112 Encounter for antineoplastic immunotherapy: Secondary | ICD-10-CM | POA: Diagnosis not present

## 2022-09-05 DIAGNOSIS — C320 Malignant neoplasm of glottis: Secondary | ICD-10-CM | POA: Diagnosis not present

## 2022-09-05 DIAGNOSIS — Z7984 Long term (current) use of oral hypoglycemic drugs: Secondary | ICD-10-CM | POA: Diagnosis not present

## 2022-09-05 DIAGNOSIS — Z79899 Other long term (current) drug therapy: Secondary | ICD-10-CM | POA: Diagnosis not present

## 2022-09-05 DIAGNOSIS — C8338 Diffuse large B-cell lymphoma, lymph nodes of multiple sites: Secondary | ICD-10-CM | POA: Diagnosis not present

## 2022-09-05 DIAGNOSIS — Z8572 Personal history of non-Hodgkin lymphomas: Secondary | ICD-10-CM | POA: Diagnosis not present

## 2022-09-05 LAB — CMP (CANCER CENTER ONLY)
ALT: 11 U/L (ref 0–44)
AST: 12 U/L — ABNORMAL LOW (ref 15–41)
Albumin: 4.2 g/dL (ref 3.5–5.0)
Alkaline Phosphatase: 78 U/L (ref 38–126)
Anion gap: 7 (ref 5–15)
BUN: 11 mg/dL (ref 8–23)
CO2: 27 mmol/L (ref 22–32)
Calcium: 10 mg/dL (ref 8.9–10.3)
Chloride: 103 mmol/L (ref 98–111)
Creatinine: 0.97 mg/dL (ref 0.61–1.24)
GFR, Estimated: >60 mL/min (ref >60)
Glucose, Bld: 225 mg/dL — ABNORMAL HIGH (ref 70–99)
Potassium: 4.2 mmol/L (ref 3.5–5.1)
Sodium: 137 mmol/L (ref 135–145)
Total Bilirubin: 0.4 mg/dL (ref 0.3–1.2)
Total Protein: 7.4 g/dL (ref 6.5–8.1)

## 2022-09-05 LAB — CBC WITH DIFFERENTIAL (CANCER CENTER ONLY)
Abs Immature Granulocytes: 0.02 10*3/uL (ref 0.00–0.07)
Basophils Absolute: 0 10*3/uL (ref 0.0–0.1)
Basophils Relative: 1 %
Eosinophils Absolute: 0.1 10*3/uL (ref 0.0–0.5)
Eosinophils Relative: 1 %
HCT: 33.4 % — ABNORMAL LOW (ref 39.0–52.0)
Hemoglobin: 10.9 g/dL — ABNORMAL LOW (ref 13.0–17.0)
Immature Granulocytes: 0 %
Lymphocytes Relative: 43 %
Lymphs Abs: 2.5 10*3/uL (ref 0.7–4.0)
MCH: 28.4 pg (ref 26.0–34.0)
MCHC: 32.6 g/dL (ref 30.0–36.0)
MCV: 87 fL (ref 80.0–100.0)
Monocytes Absolute: 0.5 10*3/uL (ref 0.1–1.0)
Monocytes Relative: 9 %
Neutro Abs: 2.8 10*3/uL (ref 1.7–7.7)
Neutrophils Relative %: 46 %
Platelet Count: 297 10*3/uL (ref 150–400)
RBC: 3.84 MIL/uL — ABNORMAL LOW (ref 4.22–5.81)
RDW: 14.1 % (ref 11.5–15.5)
WBC Count: 5.9 10*3/uL (ref 4.0–10.5)
nRBC: 0 % (ref 0.0–0.2)

## 2022-09-05 MED ORDER — SODIUM CHLORIDE 0.9 % IV SOLN
150.0000 mg | Freq: Once | INTRAVENOUS | Status: AC
  Administered 2022-09-05: 150 mg via INTRAVENOUS
  Filled 2022-09-05: qty 150

## 2022-09-05 MED ORDER — SODIUM CHLORIDE 0.9 % IV SOLN
10.0000 mg | Freq: Once | INTRAVENOUS | Status: AC
  Administered 2022-09-05: 10 mg via INTRAVENOUS
  Filled 2022-09-05: qty 10

## 2022-09-05 MED ORDER — PALONOSETRON HCL INJECTION 0.25 MG/5ML
0.2500 mg | Freq: Once | INTRAVENOUS | Status: AC
  Administered 2022-09-05: 0.25 mg via INTRAVENOUS
  Filled 2022-09-05: qty 5

## 2022-09-05 MED ORDER — SODIUM CHLORIDE 0.9 % IV SOLN: Freq: Once | INTRAVENOUS | Status: AC

## 2022-09-05 MED ORDER — SODIUM CHLORIDE 0.9 % IV SOLN
800.0000 mg/m2/d | INTRAVENOUS | Status: DC
  Administered 2022-09-05: 8500 mg via INTRAVENOUS
  Filled 2022-09-05: qty 170

## 2022-09-05 MED ORDER — SODIUM CHLORIDE 0.9% FLUSH
10.0000 mL | Freq: Once | INTRAVENOUS | Status: AC
  Administered 2022-09-05: 10 mL

## 2022-09-05 MED ORDER — SODIUM CHLORIDE 0.9 % IV SOLN
600.0000 mg | Freq: Once | INTRAVENOUS | Status: AC
  Administered 2022-09-05: 600 mg via INTRAVENOUS
  Filled 2022-09-05: qty 60

## 2022-09-05 MED ORDER — SODIUM CHLORIDE 0.9 % IV SOLN
200.0000 mg | Freq: Once | INTRAVENOUS | Status: AC
  Administered 2022-09-05: 200 mg via INTRAVENOUS
  Filled 2022-09-05: qty 200

## 2022-09-05 NOTE — Progress Notes (Signed)
[facility]   [phone]) [phone] [phone]) [phone]   CLINIC Notes  Date of Service:  09/05/22   Patient Care Team: Johny Blamer, MD as PCP - General (Family Medicine) Wendall Stade, MD as PCP - Cardiology (Cardiology) Lonie Peak, MD as Consulting Physician (Radiation Oncology) Malmfelt, Lise Auer, RN as Oncology Nurse Navigator Osborn Coho, MD (Inactive) as Consulting Physician (Otolaryngology) Johney Maine, MD as Attending Physician (Hematology)   Date of Service:  09/05/2022  CHIEF COMPLAINTS/PURPOSE OF CONSULTATION:  F/u for continued evaluation and mx of 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  HISTORY OF PRESENTING ILLNESS:   Jesus [name] 67 y.o. male is here because of left lower extremity edema and lymphadenopathy.  The patient was seen in the emergency room this past Friday for the same issue.  A CT of the abdomen and pelvis was performed showing bulky left inguinal, left hemipelvic, and retroperitoneal adenopathy.  He was referred to [place] from the emergency room for further evaluation.  Doppler ultrasound of the left lower extremity was performed and was negative for DVT. Patient reports that he has been having left lower extremity edema in his left groin and left leg for approximately 1 month.  He states that the swelling in the left groin started to get better but then worsened.  The left lower extremity edema has slowly worsened over time.  Patient denies having fevers and chills.  He reports that he does have night sweats at times.  He reported having headaches approximate 1 [place]  He thinks his headaches are related to not having his blood pressure medication.  His headaches have now resolved.  He denies visual changes.  The patient denies chest pain, shortness of breath and cough.  No nausea, vomiting, constipation, diarrhea.  Denies abdominal pain.  The patient denies recent  weight loss and has actually gained weight recently.  Patient denies epistaxis, bleeding gums, hemoptysis, hematuria, but occasionally, and melena.  He reports increased urinary frequency over the past month but no dysuria.  The patient is here for evaluation and discussion of his recent CT and lab findings.  Interval History:  Jesus [name] 67 y.o. male who is here for continued evaluation and management of newly noted metastatic laryngeal squamous cell carcinoma. He is here for cycle 2 day 1 of carbo/5FU/Pembrolizumab.  Patient was last seen by me on 08/29/2022 and he complained of nose soreness and mild fatigue after cycle 1 of his treatment.   Patient reports he has been doing well overall without any new medical concerns since our last visit. He denies fever, chills, night sweats, nausea, vomiting, back pain, chest pain, abdominal pain, abnormal bowel movement, or leg swelling. He notes that his taste has been difference since he started his treatment.   He notes that he is now taking Metformin twice a day. His glucose levels have been around 250 at home.    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- last radiation 06-2018   Cataract    removed both eyes with l;ens implants    COPD (chronic obstructive pulmonary disease) (HCC)    Family history of colon cancer  in his brother- dx'd age 68    History of chemotherapy    last 06-01-2018   History of kidney stones    History of radiation therapy    last radiation 06-2018   Hyperlipidemia    currently under control   Hypertension    Irregular heart beats    Lymphadenopathy    Pain, lower leg    Bilateral   Peripheral arterial disease (HCC)    Pre-diabetes    Red-green color blindness    RLS (restless legs syndrome)     Snores    Wears glasses     SURGICAL HISTORY: Past Surgical History:  Procedure Laterality Date   CATARACT EXTRACTION W/ INTRAOCULAR LENS  IMPLANT, BILATERAL     COLONOSCOPY     DIRECT LARYNGOSCOPY Right 02/03/2022   Procedure: DIRECT LARYNGOSCOPY WITH BIOPSY OF RIGHT FALSE VOCAL CORD;  Surgeon: Osborn Coho, MD;  Location: [facility] OR;  Service: ENT;  Laterality: Right;   dislodged salava stone     FRACTURE SURGERY     HAND ARTHROPLASTY  1995   crushed left hand   INGUINAL LYMPH NODE BIOPSY Left 01/02/2018   Procedure: LEFT INGUINAL LYMPH NODE BIOPSY;  Surgeon: Emelia Loron, MD;  Location: [facility] OR;  Service: General;  Laterality: Left;   IR IMAGING GUIDED PORT INSERTION  01/15/2018   IR IMAGING GUIDED PORT INSERTION  08/10/2022   IR REMOVAL TUN ACCESS W/ PORT W/O FL MOD SED  03/11/2019   MEDIASTINOSCOPY N/A 07/22/2022   Procedure: MEDIASTINOSCOPY;  Surgeon: Loreli Slot, MD;  Location: [facility] OR;  Service: Thoracic;  Laterality: N/A;   MICROLARYNGOSCOPY Left 01/17/2014   Procedure: MICROLARYNGOSCOPY WITH EXCISION OF THE BIOPSY OF LEFT VOCAL CORD LESION;  Surgeon: Serena Colonel, MD;  Location: [facility];  Service: ENT;  Laterality: Left;   MICROLARYNGOSCOPY N/A 09/16/2020   Procedure: MICROLARYNGOSCOPY with Biopsy of vocal cord lesion;  Surgeon: Osborn Coho, MD;  Location: [facility] OR;  Service: ENT;  Laterality: N/A;   ORIF FOOT FRACTURE  2005   left   REFRACTIVE SURGERY Right    removed cloudiness in right eye after cataract removal     SOCIAL HISTORY: Social History   Socioeconomic History   Marital status: Divorced    Spouse name: Not on file   Number of children: 3   Years of education: Not on file   Highest education level: Not on file  Occupational History   Not on file  Tobacco Use   Smoking status: Former    Packs/day: 0.50    Years: 36.00    Additional pack years: 0.00    Total pack years: 18.00    Types: Cigarettes    Quit date: 09/28/2020     Years since quitting: 1.9   Smokeless tobacco: Never   Tobacco comments:    he denies smoking in about 2 weeks 07/13/18  Vaping Use   Vaping Use: Never used  Substance and Sexual Activity   Alcohol use: Yes    Alcohol/week: 6.0 standard drinks of alcohol    Types: 6 Cans of beer per week    Comment: weekends   Drug use: Yes    Types: Marijuana, Cocaine    Comment: reports cocaine usage ~2X/ month; last use 07/2022   Sexual activity: Not on file  Other Topics Concern   Not on file  Social History Narrative   Not on file   Social Determinants of Health   Financial Resource Strain: Low Risk  (10/06/2020)  Overall Financial Resource Strain (CARDIA)    Difficulty of Paying Living Expenses: Not very hard  Food Insecurity: No Food Insecurity (10/06/2020)   Hunger Vital Sign    Worried About Running Out of Food in the Last Year: Never true    Ran Out of Food in the Last Year: Never true  Transportation Needs: No Transportation Needs (10/06/2020)   PRAPARE - Administrator, Civil Service (Medical): No    Lack of Transportation (Non-Medical): No  Physical Activity: Not on file  Stress: Not on file  Social Connections: Moderately Integrated (10/06/2020)   Social Connection and Isolation Panel [NHANES]    Frequency of Communication with Friends and Family: Three times a week    Frequency of Social Gatherings with Friends and Family: Three times a week    Attends Religious Services: 1 to 4 times per year    Active Member of Clubs or Organizations: Yes    Attends Banker Meetings: 1 to 4 times per year    Marital Status: Divorced  Intimate Partner Violence: Not At Risk (07/13/2018)   Humiliation, Afraid, Rape, and Kick questionnaire    Fear of Current or Ex-Partner: No    Emotionally Abused: No    Physically Abused: No    Sexually Abused: No    FAMILY HISTORY: Family History  Problem Relation Age of Onset   Breast cancer Mother    Diabetes Father     Hypertension Father    Stroke Father    Mental illness Sister    Hypertension Daughter    Mental illness Daughter    Hypertension Brother    Colon cancer Brother 13       passed away November 19, 2018   Breast cancer Sister    Esophageal cancer Neg Hx    Colon polyps Neg Hx    Rectal cancer Neg Hx    Stomach cancer Neg Hx     ALLERGIES:  is allergic to bee venom, antifungal [miconazole nitrate], and zolpidem tartrate er.  MEDICATIONS:  Current Outpatient Medications  Medication Sig Dispense Refill   acetaminophen (TYLENOL) 500 MG tablet Take 1,500 mg by mouth daily as needed for moderate pain.     amLODipine (NORVASC) 10 MG tablet TAKE 1 TABLET BY MOUTH EVERY DAY (Patient taking differently: Take 10 mg by mouth every evening.) 30 tablet 0   aspirin EC 81 MG tablet Take 81 mg by mouth every evening. Swallow whole.     b complex vitamins capsule Take 1 capsule by mouth daily.     dexamethasone (DECADRON) 4 MG tablet Take 2 tablets (8mg ) by mouth daily starting the day after carboplatin for 3 days. Take with food 30 tablet 1   lidocaine-prilocaine (EMLA) cream Apply to affected area once 30 g 3   lisinopril (ZESTRIL) 20 MG tablet TAKE 1 TABLET BY MOUTH EVERY DAY (Patient taking differently: Take 20 mg by mouth every evening.) 30 tablet 0   metFORMIN (GLUCOPHAGE) 500 MG tablet Take 500 mg by mouth daily.     Multiple Vitamins-Minerals (MULTI ADULT GUMMIES PO) Take 1 tablet by mouth in the morning. Centrum     ondansetron (ZOFRAN) 8 MG tablet Take 1 tablet (8 mg total) by mouth every 8 (eight) hours as needed for nausea or vomiting. Start on the third day after carboplatin. 30 tablet 1   oxyCODONE (OXY IR/ROXICODONE) 5 MG immediate release tablet Take 1 tablet (5 mg total) by mouth every 6 (six) hours as needed for moderate pain  or breakthrough pain. 20 tablet 0   prochlorperazine (COMPAZINE) 10 MG tablet Take 1 tablet (10 mg total) by mouth every 6 (six) hours as needed for nausea or vomiting. 30  tablet 1   tadalafil (CIALIS) 20 MG tablet Take 20 mg by mouth daily as needed for erectile dysfunction.     No current facility-administered medications for this visit.    REVIEW OF SYSTEMS:   10 Point review of Systems was done is negative except as noted above.  PHYSICAL EXAMINATION: .BP 134/85   Pulse 83   Temp 98.2 F (36.8 C)   Resp 20   Wt (!) 309 lb 9.6 oz (140.4 kg)   SpO2 99%   BMI 41.99 kg/m  . GENERAL:alert, in no acute distress and comfortable SKIN: no acute rashes, no significant lesions EYES: conjunctiva are pink and non-injected, sclera anicteric OROPHARYNX: MMM, no exudates, no oropharyngeal erythema or ulceration NECK: supple, no JVD LYMPH:  no palpable lymphadenopathy in the cervical, axillary or inguinal regions LUNGS: clear to auscultation b/l with normal respiratory effort HEART: regular rate & rhythm ABDOMEN:  normoactive bowel sounds , non tender, not distended. Extremity: no pedal edema PSYCH: alert & oriented x 3 with fluent speech NEURO: no focal motor/sensory deficits   LABORATORY DATA:   I have reviewed the data as listed    Latest Ref Rng & Units 09/05/2022    1:53 PM 08/29/2022    2:28 PM 08/15/2022    7:57 AM  CBC  WBC 4.0 - 10.5 K/uL 5.9  9.1  6.6   Hemoglobin 13.0 - 17.0 g/dL 86.5  78.4  69.6   Hematocrit 39.0 - 52.0 % 33.4  32.3  33.6   Platelets 150 - 400 K/uL 297  335  328    CBC    Component Value Date/Time   WBC 5.9 09/05/2022 1353   WBC 6.5 07/19/2022 1400   RBC 3.84 (L) 09/05/2022 1353   HGB 10.9 (L) 09/05/2022 1353   HCT 33.4 (L) 09/05/2022 1353   PLT 297 09/05/2022 1353   MCV 87.0 09/05/2022 1353   MCV 87.3 12/22/2017 1518   MCH 28.4 09/05/2022 1353   MCHC 32.6 09/05/2022 1353   RDW 14.1 09/05/2022 1353   LYMPHSABS 2.5 09/05/2022 1353   MONOABS 0.5 09/05/2022 1353   EOSABS 0.1 09/05/2022 1353   BASOSABS 0.0 09/05/2022 1353       Latest Ref Rng & Units 09/05/2022    1:53 PM 08/29/2022    2:28 PM 08/15/2022     7:57 AM  CMP  Glucose 70 - 99 mg/dL 295  284  132   BUN 8 - 23 mg/dL 11  16  13    Creatinine 0.61 - 1.24 mg/dL 4.40  1.02  7.25   Sodium 135 - 145 mmol/L 137  137  139   Potassium 3.5 - 5.1 mmol/L 4.2  4.1  3.6   Chloride 98 - 111 mmol/L 103  99  104   CO2 22 - 32 mmol/L 27  31  28    Calcium 8.9 - 10.3 mg/dL 36.6  44.0  9.6   Total Protein 6.5 - 8.1 g/dL 7.4  7.1  7.4   Total Bilirubin 0.3 - 1.2 mg/dL 0.4  0.4  0.4   Alkaline Phos 38 - 126 U/L 78  74  66   AST 15 - 41 U/L 12  9  12    ALT 0 - 44 U/L 11  9  11     .  Lab Results  Component Value Date   LDH 168 05/18/2022   Tsh 1.42 (05/21/2021)   01/02/18 Left Inguinal LN Bx:   12/26/17 Tissue Flow Cytometry:   12/26/17 Inguinal Core biopsy:      RADIOGRAPHIC STUDIES: I have personally reviewed the radiological images as listed and agreed with the findings in the report. DG Chest 2 View  Result Date: 08/16/2022 CLINICAL DATA:  Paratracheal adenopathy EXAM: CHEST - 2 VIEW COMPARISON:  07/19/2022, PET CT 06/08/2022, CT chest 05/05/2022 FINDINGS: Right-sided central venous port tip at the SVC. Linear atelectasis left base. No acute airspace disease, pleural effusion, or pneumothorax. Tracheal deviation to the right secondary to left paratracheal opacity corresponding to history of enlarged lymph nodes. IMPRESSION: 1. Right-sided central venous port with tip over the SVC. 2. Left paratracheal opacity with tracheal deviation to the right corresponding to history of left paratracheal enlarged lymph node 3. Borderline cardiomegaly.  Linear atelectasis left base. Electronically Signed   By: Jasmine Pang M.D.   On: 08/16/2022 15:31   IR IMAGING GUIDED PORT INSERTION  Result Date: 08/10/2022 CLINICAL DATA:  Head and neck carcinoma, needs durable venous access for planned treatment regimen EXAM: TUNNELED PORT CATHETER PLACEMENT WITH ULTRASOUND AND FLUOROSCOPIC GUIDANCE FLUOROSCOPY: Radiation Exposure Index (as provided by the fluoroscopic  device): 3 mGy air Kerma ANESTHESIA/SEDATION: Intravenous Fentanyl and Versed 2mg  were administered as conscious sedation during continuous monitoring of the patient's level of consciousness and physiological / cardiorespiratory status by the radiology RN, with a total moderate sedation time of 14 minutes. TECHNIQUE: The procedure, risks, benefits, and alternatives were explained to the patient. Questions regarding the procedure were encouraged and answered. The patient understands and consents to the procedure. Patency of the right IJ vein was confirmed with ultrasound with image documentation. An appropriate skin site was determined. Skin site was marked. Region was prepped using maximum barrier technique including cap and mask, sterile gown, sterile gloves, large sterile sheet, and Chlorhexidine as cutaneous antisepsis. The region was infiltrated locally with 1% lidocaine. Under real-time ultrasound guidance, the right IJ vein was accessed with a 21 gauge micropuncture needle; the needle tip within the vein was confirmed with ultrasound image documentation. Needle was exchanged over a 018 guidewire for transitional dilator, and vascular measurement was performed. A small incision was made on the right anterior chest wall and a subcutaneous pocket fashioned. The power-injectable port was positioned and its catheter tunneled to the right IJ dermatotomy site. The transitional dilator was exchanged over an Amplatz wire for a peel-away sheath, through which the port catheter, which had been trimmed to the appropriate length, was advanced and positioned under fluoroscopy with its tip at the cavoatrial junction. Spot chest radiograph confirms good catheter position and no pneumothorax. The port was flushed per protocol. The pocket was closed with deep interrupted and subcuticular continuous 3-0 Monocryl sutures. The incisions were covered with Dermabond then covered with a sterile dressing. The patient tolerated  the procedure well. COMPLICATIONS: COMPLICATIONS None immediate IMPRESSION: Technically successful right IJ power-injectable port catheter placement. Ready for routine use. Electronically Signed   By: Corlis Leak M.D.   On: 08/10/2022 19:16    ASSESSMENT & PLAN:   This is a pleasant 67 y.o. 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   CT of the  abdomen and pelvis performed on 12/22/2017 showed bulky left inguinal, left hemipelvic, and  retroperitoneal adenopathy.     01/02/18 Left inguinal LN Biopsy revealed T-Cell/histocyte rich Large B-Cell Lymphoma   12/27/17 ECHO revealed LV EF of 55-60%    01/05/18 PET/CT revealed Massively enlarged pelvic lymph nodes intense metabolic activity consistent lymphoma. 2. Additional hypermetabolic lymph nodes in the porta hepatis and retroperitoneum LEFT aorta. 3. Solitary hypermetabolic mediastinal lymph node in the upper LEFT Mediastinum. 4. 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 5. Normal spleen.     HIV non reactive on 12/22/2017.  Hep C and hep B serology negative.   03/14/18 PET/CT revealed PET-CT findings suggest an excellent response to chemotherapy. The abdominal lymphadenopathy has near completely resolved and demonstrates a near complete metabolic response. The pelvic and inguinal adenopathy has significantly decreased in size and the metabolic activity has significantly decreased. 2. Diffuse marrow activity likely due to chemotherapy and or marrow stimulating drugs. I do not see any discrete persistent lesions.    04/17/18 CT Head revealed Subtle mesial caudothalamic hypodensities may be artifact though, the could reflect encephalitis or Wernicke's  encephalopathy. Consider MRI of the head with and without contrast. 2. Mild chronic small vessel ischemic changes    S/p 6 cycles of EPOCH-R completed on 06/01/18  06/28/18 PET/CT revealed Continued good response to treatment. No residual measurable or hypermetabolic abdominal lymphadenopathy and no recurrent osseous disease. 2. Interval decrease in size of the left operator region lymph node and also the left inguinal lymph node. However, the both have small foci of slightly increased hypermetabolism which bears surveillance. 3. No new or progressive lymphadenopathy in the neck, chest, abdomen or pelvis.  S/p 39.6 Gy in 22 fractions between 07/25/18 and 08/23/18  02/13/2019 CT C/A/P ([id number]) ([id number]) revealed "1. Response to therapy of pelvic adenopathy compared to the PET of 06/28/2018. 2. No new or progressive disease. 3. Mild prostatomegaly. 4. 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 5. Hepatomegaly."   2) left lower extremity swelling- now resolved  Doppler ultrasound for DVT was negative in the left lower extremity.   Likely from venous compression +/- lymphatic obstruction from bulky left inguinal, left hemipelvic, and  retroperitoneal adenopathy.    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  PLAN: -Discussed lab results from today, 09/05/2022, with the patient. CBC shows decreased hemoglobin at 10.9 and decreased hematocrit of 33.4, which are stable since last visit. CMP is pending.   -Patient tolerated his first cycle of his treatment well with mild toxicity of  nose soreness and mild fatigue.  -Patient can proceed with cycle 2 of his treatment as planned without any dose modification. Orders for chemotherapy reviewed and signed. -recommended to stay hydrated and exercise.    FOLLOW-UP: Plz schedule C3 and C4 of chemotherapy per integrated scheduling with portflush and labs  The total time spent in the appointment was 30 minutes* .  All of the patient's questions were answered with apparent satisfaction. The patient knows to call the clinic with any problems, questions or concerns.   Wyvonnia Lora MD MS AAHIVMS [facility] Hematology/Oncology Physician [facility]  .*  Total Encounter Time as defined by the Centers for Medicare and Medicaid Services includes, in addition to the face-to-face time of a patient visit (documented in the note above) non-face-to-face time: obtaining and reviewing outside history, ordering and reviewing medications, tests or procedures, care coordination (communications with other health care professionals or caregivers) and documentation in the medical record.   I, Ok Edwards, am acting as a Neurosurgeon for Wyvonnia Lora, MD. .I have reviewed the above documentation for accuracy and completeness, and I agree with the above. Johney Maine MD

## 2022-09-05 NOTE — Patient Instructions (Signed)
[facility] [facility]  Discharge Instructions: Thank you for choosing [facility] to provide your oncology and hematology care.   If you have a lab appointment with the Cancer Center, please go directly to the Cancer Center and check in at the registration area.   Wear comfortable clothing and clothing appropriate for easy access to any Portacath or PICC line.   We strive to give you quality time with your provider. You may need to reschedule your appointment if you arrive late (15 or more minutes).  Arriving late affects you and other patients whose appointments are after yours.  Also, if you miss three or more appointments without notifying the office, you may be dismissed from the clinic at the provider's discretion.      For prescription refill requests, have your pharmacy contact our office and allow 72 hours for refills to be completed.    Today you received the following chemotherapy and/or immunotherapy agents:Pembrolizumab (Keytruda), Carboplatin, and Fluorouracil.   To help prevent nausea and vomiting after your treatment, we encourage you to take your nausea medication as directed.  BELOW ARE SYMPTOMS THAT SHOULD BE REPORTED IMMEDIATELY: *FEVER GREATER THAN 100.4 F (38 C) OR HIGHER *CHILLS OR SWEATING *NAUSEA AND VOMITING THAT IS NOT CONTROLLED WITH YOUR NAUSEA MEDICATION *UNUSUAL SHORTNESS OF BREATH *UNUSUAL BRUISING OR BLEEDING *URINARY PROBLEMS (pain or burning when urinating, or frequent urination) *BOWEL PROBLEMS (unusual diarrhea, constipation, pain near the anus) TENDERNESS IN MOUTH AND THROAT WITH OR WITHOUT PRESENCE OF ULCERS (sore throat, sores in mouth, or a toothache) UNUSUAL RASH, SWELLING OR PAIN  UNUSUAL VAGINAL DISCHARGE OR ITCHING   Items with * indicate a potential emergency and should be followed up as soon as possible or go to the Emergency Department if any problems should occur.  Please show the CHEMOTHERAPY  ALERT CARD or IMMUNOTHERAPY ALERT CARD at check-in to the Emergency Department and triage nurse.  Should you have questions after your visit or need to cancel or reschedule your appointment, please contact [place] [facility]  Dept: [phone]  and follow the prompts.  Office hours are 8:00 a.m. to 4:30 p.m. Monday - Friday. Please note that voicemails left after 4:00 p.m. may not be returned until the following business day.  We are closed weekends and major holidays. You have access to a nurse at all times for urgent questions. Please call the main number to the clinic Dept: [phone] and follow the prompts.   For any non-urgent questions, you may also contact your provider using MyChart. We now offer e-Visits for anyone 6 and older to request care online for non-urgent symptoms. For details visit [email or web address].   Also download the MyChart app! Go to the app store, search "MyChart", open the app, select [place], and log in with your MyChart username and password.  The chemotherapy medication bag should finish at 46 hours, 96 hours, or 7 days. For example, if your pump is scheduled for 46 hours and it was put on at 4:00 p.m., it should finish at 2:00 p.m. the day it is scheduled to come off regardless of your appointment time.     Estimated time to finish at 2:30 pm   If the display on your pump reads "Low Volume" and it is beeping, take the batteries out of the pump and come to the cancer center for it to be taken off.   If the pump alarms go off prior to  the pump reading "Low Volume" then call [phone] and someone can assist you.  If the plunger comes out and the chemotherapy medication is leaking out, please use your home chemo spill kit to clean up the spill. Do NOT use paper towels or other household products.  If you have problems or questions regarding your pump, please call either [phone] (24 hours a day) or the cancer center  Monday-Friday 8:00 a.m.- 4:30 p.m. at the clinic number and we will assist you. If you are unable to get assistance, then go to the nearest Emergency Department and ask the staff to contact the IV team for assistance.

## 2022-09-05 NOTE — Progress Notes (Signed)
Patient seen by Dr. Addison Naegeli are within treatment parameters.  Labs reviewed: and are within treatment parameters. CMP pending  Per physician team, patient is ready for treatment and there are NO modifications to the treatment plan.

## 2022-09-05 NOTE — Progress Notes (Signed)
Pt was approved for [employer]nd [employer] grant on 08/19/22. He has used all of his grant funds as explained all of the expenses are deducted from the same grant. He was inquiring about a gift card today.  He has my card for any additional financial questions or concerns.

## 2022-09-06 ENCOUNTER — Other Ambulatory Visit: Payer: Self-pay

## 2022-09-09 ENCOUNTER — Inpatient Hospital Stay: Payer: Medicare HMO | Attending: Hematology

## 2022-09-09 DIAGNOSIS — Z5111 Encounter for antineoplastic chemotherapy: Secondary | ICD-10-CM | POA: Insufficient documentation

## 2022-09-09 DIAGNOSIS — C320 Malignant neoplasm of glottis: Secondary | ICD-10-CM | POA: Insufficient documentation

## 2022-09-09 DIAGNOSIS — Z7189 Other specified counseling: Secondary | ICD-10-CM

## 2022-09-09 DIAGNOSIS — Z5112 Encounter for antineoplastic immunotherapy: Secondary | ICD-10-CM | POA: Insufficient documentation

## 2022-09-09 DIAGNOSIS — Z79899 Other long term (current) drug therapy: Secondary | ICD-10-CM | POA: Diagnosis not present

## 2022-09-09 DIAGNOSIS — C760 Malignant neoplasm of head, face and neck: Secondary | ICD-10-CM

## 2022-09-09 MED ORDER — HEPARIN SOD (PORK) LOCK FLUSH 100 UNIT/ML IV SOLN: 500.0000 [IU] | Freq: Once | INTRAVENOUS | Status: DC | PRN

## 2022-09-09 MED ORDER — SODIUM CHLORIDE 0.9% FLUSH: 10.0000 mL | INTRAVENOUS | Status: DC | PRN

## 2022-09-11 ENCOUNTER — Encounter: Payer: Self-pay | Admitting: Hematology

## 2022-09-23 MED FILL — Dexamethasone Sodium Phosphate Inj 100 MG/10ML: INTRAMUSCULAR | Qty: 1 | Status: AC

## 2022-09-23 MED FILL — Fosaprepitant Dimeglumine For IV Infusion 150 MG (Base Eq): INTRAVENOUS | Qty: 5 | Status: AC

## 2022-09-26 ENCOUNTER — Inpatient Hospital Stay (HOSPITAL_BASED_OUTPATIENT_CLINIC_OR_DEPARTMENT_OTHER): Payer: Medicare HMO | Admitting: Hematology

## 2022-09-26 ENCOUNTER — Inpatient Hospital Stay: Payer: Medicare HMO

## 2022-09-26 VITALS — BP 131/71 | HR 85 | Temp 97.8°F | Resp 20

## 2022-09-26 DIAGNOSIS — Z5111 Encounter for antineoplastic chemotherapy: Secondary | ICD-10-CM | POA: Diagnosis not present

## 2022-09-26 DIAGNOSIS — Z7189 Other specified counseling: Secondary | ICD-10-CM

## 2022-09-26 DIAGNOSIS — Z79899 Other long term (current) drug therapy: Secondary | ICD-10-CM | POA: Diagnosis not present

## 2022-09-26 DIAGNOSIS — C760 Malignant neoplasm of head, face and neck: Secondary | ICD-10-CM

## 2022-09-26 DIAGNOSIS — C320 Malignant neoplasm of glottis: Secondary | ICD-10-CM | POA: Diagnosis not present

## 2022-09-26 DIAGNOSIS — Z5112 Encounter for antineoplastic immunotherapy: Secondary | ICD-10-CM | POA: Diagnosis not present

## 2022-09-26 DIAGNOSIS — Z95828 Presence of other vascular implants and grafts: Secondary | ICD-10-CM

## 2022-09-26 DIAGNOSIS — C8338 Diffuse large B-cell lymphoma, lymph nodes of multiple sites: Secondary | ICD-10-CM | POA: Diagnosis not present

## 2022-09-26 LAB — CBC WITH DIFFERENTIAL (CANCER CENTER ONLY)
Abs Immature Granulocytes: 0.02 10*3/uL (ref 0.00–0.07)
Basophils Absolute: 0 10*3/uL (ref 0.0–0.1)
Basophils Relative: 0 %
Eosinophils Absolute: 0 10*3/uL (ref 0.0–0.5)
Eosinophils Relative: 1 %
HCT: 31.9 % — ABNORMAL LOW (ref 39.0–52.0)
Hemoglobin: 10.2 g/dL — ABNORMAL LOW (ref 13.0–17.0)
Immature Granulocytes: 0 %
Lymphocytes Relative: 61 %
Lymphs Abs: 2.9 10*3/uL (ref 0.7–4.0)
MCH: 29.2 pg (ref 26.0–34.0)
MCHC: 32 g/dL (ref 30.0–36.0)
MCV: 91.4 fL (ref 80.0–100.0)
Monocytes Absolute: 0.6 10*3/uL (ref 0.1–1.0)
Monocytes Relative: 12 %
Neutro Abs: 1.2 10*3/uL — ABNORMAL LOW (ref 1.7–7.7)
Neutrophils Relative %: 26 %
Platelet Count: 455 10*3/uL — ABNORMAL HIGH (ref 150–400)
RBC: 3.49 MIL/uL — ABNORMAL LOW (ref 4.22–5.81)
RDW: 16 % — ABNORMAL HIGH (ref 11.5–15.5)
WBC Count: 4.8 10*3/uL (ref 4.0–10.5)
nRBC: 0 % (ref 0.0–0.2)

## 2022-09-26 LAB — TSH: TSH: 2.146 u[IU]/mL (ref 0.350–4.500)

## 2022-09-26 LAB — CMP (CANCER CENTER ONLY)
ALT: 12 U/L (ref 0–44)
AST: 12 U/L — ABNORMAL LOW (ref 15–41)
Albumin: 4.1 g/dL (ref 3.5–5.0)
Alkaline Phosphatase: 70 U/L (ref 38–126)
Anion gap: 7 (ref 5–15)
BUN: 10 mg/dL (ref 8–23)
CO2: 27 mmol/L (ref 22–32)
Calcium: 9.5 mg/dL (ref 8.9–10.3)
Chloride: 104 mmol/L (ref 98–111)
Creatinine: 1.05 mg/dL (ref 0.61–1.24)
GFR, Estimated: >60 mL/min (ref >60)
Glucose, Bld: 144 mg/dL — ABNORMAL HIGH (ref 70–99)
Potassium: 4.2 mmol/L (ref 3.5–5.1)
Sodium: 138 mmol/L (ref 135–145)
Total Bilirubin: 0.4 mg/dL (ref 0.3–1.2)
Total Protein: 7.1 g/dL (ref 6.5–8.1)

## 2022-09-26 MED ORDER — PALONOSETRON HCL INJECTION 0.25 MG/5ML
0.2500 mg | Freq: Once | INTRAVENOUS | Status: AC
  Administered 2022-09-26: 0.25 mg via INTRAVENOUS
  Filled 2022-09-26: qty 5

## 2022-09-26 MED ORDER — SODIUM CHLORIDE 0.9 % IV SOLN
800.0000 mg/m2/d | INTRAVENOUS | Status: DC
  Administered 2022-09-26: 8500 mg via INTRAVENOUS
  Filled 2022-09-26: qty 170

## 2022-09-26 MED ORDER — SODIUM CHLORIDE 0.9 % IV SOLN
200.0000 mg | Freq: Once | INTRAVENOUS | Status: AC
  Administered 2022-09-26: 200 mg via INTRAVENOUS
  Filled 2022-09-26: qty 200

## 2022-09-26 MED ORDER — SODIUM CHLORIDE 0.9 % IV SOLN
600.0000 mg | Freq: Once | INTRAVENOUS | Status: AC
  Administered 2022-09-26: 600 mg via INTRAVENOUS
  Filled 2022-09-26: qty 60

## 2022-09-26 MED ORDER — SODIUM CHLORIDE 0.9% FLUSH: 10.0000 mL | INTRAVENOUS | Status: DC | PRN

## 2022-09-26 MED ORDER — SODIUM CHLORIDE 0.9 % IV SOLN
150.0000 mg | Freq: Once | INTRAVENOUS | Status: AC
  Administered 2022-09-26: 150 mg via INTRAVENOUS
  Filled 2022-09-26: qty 150

## 2022-09-26 MED ORDER — SODIUM CHLORIDE 0.9 % IV SOLN
10.0000 mg | Freq: Once | INTRAVENOUS | Status: AC
  Administered 2022-09-26: 10 mg via INTRAVENOUS
  Filled 2022-09-26: qty 10

## 2022-09-26 MED ORDER — HEPARIN SOD (PORK) LOCK FLUSH 100 UNIT/ML IV SOLN: 500.0000 [IU] | Freq: Once | INTRAVENOUS | Status: DC | PRN

## 2022-09-26 MED ORDER — SODIUM CHLORIDE 0.9% FLUSH
10.0000 mL | Freq: Once | INTRAVENOUS | Status: AC
  Administered 2022-09-26: 10 mL

## 2022-09-26 MED ORDER — SODIUM CHLORIDE 0.9 % IV SOLN: Freq: Once | INTRAVENOUS | Status: AC

## 2022-09-26 NOTE — Progress Notes (Signed)
Patient seen by Dr. Addison Naegeli are within treatment parameters.  Labs reviewed: and are not all within treatment parameters. Dr Candise Che aware ANC: 1.2  Per physician team, patient is ready for treatment and there are NO modifications to the treatment plan.

## 2022-09-26 NOTE — Progress Notes (Signed)
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  HISTORY OF PRESENTING ILLNESS:   Jesus [name] 67 y.o. male is here because of left lower extremity edema and lymphadenopathy.  The patient was seen in the emergency room this past Friday for the same issue.  A CT of the abdomen and pelvis was performed showing bulky left inguinal, left hemipelvic, and retroperitoneal adenopathy.  He was referred to [place] from the emergency room for further evaluation.  Doppler ultrasound of the left lower extremity was performed and was negative for DVT. Patient reports that he has been having left lower extremity edema in his left groin and left leg for approximately 1 month.  He states that the swelling in the left groin started to get better but then worsened.  The left lower extremity edema has slowly worsened over time.  Patient denies having fevers and chills.  He reports that he does have night sweats at times.  He reported having headaches approximate 1 [place]  He thinks his headaches are related to not having his blood pressure medication.  His headaches have now resolved.  He denies visual changes.  The patient denies chest pain, shortness of breath and cough.  No nausea, vomiting, constipation, diarrhea.  Denies abdominal pain.  The patient denies recent weight loss and has actually  gained weight recently.  Patient denies epistaxis, bleeding gums, hemoptysis, hematuria, but occasionally, and melena.  He reports increased urinary frequency over the past month but no dysuria.  The patient is here for evaluation and discussion of his recent CT and lab findings.  Interval History:  Jesus [name] 67 y.o. male who is here for continued evaluation and management of newly noted metastatic laryngeal squamous cell carcinoma. He is here for cycle 3 day 1 of carbo/5FU/Pembrolizumab.  Patient was last seen by me on 09/05/2022 and he was doing well overall.   Patient reports he has been doing well overall without any new or severe medical concerns since our last visit. He notes he tolerated his cycle 2 of his treatment well with mild fatigue. He notes that his energy level improved after his last treatment. He also lost around 30 lbs due to diet change.   He denies fever, chills, night sweats, infection issues, skin rashes, nausea, unexpected weight loss, abdominal pain, chest pain, back pain, or leg swelling. He complains of swallowing problems when swallowing medication, but denies swallowing problems with food.  Patient also complains of occasional bilateral hand and leg neuropathy due to his treatment.   Patient notes he has been walking outside.    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- last radiation 06-2018  Cataract    removed both eyes with l;ens implants    COPD (chronic obstructive pulmonary disease) (HCC)    Family history of colon cancer    in his brother- dx'd age 85    History of chemotherapy    last 06-01-2018   History of kidney stones    History of radiation therapy    last radiation 06-2018   Hyperlipidemia    currently under control    Hypertension    Irregular heart beats    Lymphadenopathy    Pain, lower leg    Bilateral   Peripheral arterial disease (HCC)    Pre-diabetes    Red-green color blindness    RLS (restless legs syndrome)    Snores    Wears glasses     SURGICAL HISTORY: Past Surgical History:  Procedure Laterality Date   CATARACT EXTRACTION W/ INTRAOCULAR LENS  IMPLANT, BILATERAL     COLONOSCOPY     DIRECT LARYNGOSCOPY Right 02/03/2022   Procedure: DIRECT LARYNGOSCOPY WITH BIOPSY OF RIGHT FALSE VOCAL CORD;  Surgeon: Osborn Coho, MD;  Location: [facility] OR;  Service: ENT;  Laterality: Right;   dislodged salava stone     FRACTURE SURGERY     HAND ARTHROPLASTY  1995   crushed left hand   INGUINAL LYMPH NODE BIOPSY Left 01/02/2018   Procedure: LEFT INGUINAL LYMPH NODE BIOPSY;  Surgeon: Emelia Loron, MD;  Location: [facility] OR;  Service: General;  Laterality: Left;   IR IMAGING GUIDED PORT INSERTION  01/15/2018   IR IMAGING GUIDED PORT INSERTION  08/10/2022   IR REMOVAL TUN ACCESS W/ PORT W/O FL MOD SED  03/11/2019   MEDIASTINOSCOPY N/A 07/22/2022   Procedure: MEDIASTINOSCOPY;  Surgeon: Loreli Slot, MD;  Location: [facility] OR;  Service: Thoracic;  Laterality: N/A;   MICROLARYNGOSCOPY Left 01/17/2014   Procedure: MICROLARYNGOSCOPY WITH EXCISION OF THE BIOPSY OF LEFT VOCAL CORD LESION;  Surgeon: Serena Colonel, MD;  Location: [facility];  Service: ENT;  Laterality: Left;   MICROLARYNGOSCOPY N/A 09/16/2020   Procedure: MICROLARYNGOSCOPY with Biopsy of vocal cord lesion;  Surgeon: Osborn Coho, MD;  Location: [facility] OR;  Service: ENT;  Laterality: N/A;   ORIF FOOT FRACTURE  2005   left   REFRACTIVE SURGERY Right    removed cloudiness in right eye after cataract removal     SOCIAL HISTORY: Social History   Socioeconomic History   Marital status: Divorced    Spouse name: Not on file   Number of children: 3   Years of education: Not on file   Highest education level: Not on file  Occupational  History   Not on file  Tobacco Use   Smoking status: Former    Packs/day: 0.50    Years: 36.00    Additional pack years: 0.00    Total pack years: 18.00    Types: Cigarettes    Quit date: 09/28/2020    Years since quitting: 1.9   Smokeless tobacco: Never   Tobacco comments:    he denies smoking in about 2 weeks 07/13/18  Vaping Use   Vaping Use: Never used  Substance and Sexual Activity   Alcohol use: Yes    Alcohol/week: 6.0 standard drinks of alcohol    Types: 6 Cans of beer per week    Comment: weekends   Drug use: Yes    Types: Marijuana, Cocaine    Comment: reports cocaine usage ~2X/ month; last use 07/2022   Sexual activity: Not on file  Other Topics  Concern   Not on file  Social History Narrative   Not on file   Social Determinants of Health   Financial Resource Strain: Low Risk  (10/06/2020)   Overall Financial Resource Strain (CARDIA)    Difficulty of Paying Living Expenses: Not very hard  Food Insecurity: No Food Insecurity (10/06/2020)   Hunger Vital Sign    Worried About Running Out of Food in the Last Year: Never true    Ran Out of Food in the Last Year: Never true  Transportation Needs: No Transportation Needs (10/06/2020)   PRAPARE - Administrator, Civil Service (Medical): No    Lack of Transportation (Non-Medical): No  Physical Activity: Not on file  Stress: Not on file  Social Connections: Moderately Integrated (10/06/2020)   Social Connection and Isolation Panel [NHANES]    Frequency of Communication with Friends and Family: Three times a week    Frequency of Social Gatherings with Friends and Family: Three times a week    Attends Religious Services: 1 to 4 times per year    Active Member of Clubs or Organizations: Yes    Attends Banker Meetings: 1 to 4 times per year    Marital Status: Divorced  Intimate Partner Violence: Not At Risk (07/13/2018)   Humiliation, Afraid, Rape, and Kick questionnaire    Fear of Current or  Ex-Partner: No    Emotionally Abused: No    Physically Abused: No    Sexually Abused: No    FAMILY HISTORY: Family History  Problem Relation Age of Onset   Breast cancer Mother    Diabetes Father    Hypertension Father    Stroke Father    Mental illness Sister    Hypertension Daughter    Mental illness Daughter    Hypertension Brother    Colon cancer Brother 75       passed away 2018/12/08   Breast cancer Sister    Esophageal cancer Neg Hx    Colon polyps Neg Hx    Rectal cancer Neg Hx    Stomach cancer Neg Hx     ALLERGIES:  is allergic to bee venom, antifungal [miconazole nitrate], and zolpidem tartrate er.  MEDICATIONS:  Current Outpatient Medications  Medication Sig Dispense Refill   acetaminophen (TYLENOL) 500 MG tablet Take 1,500 mg by mouth daily as needed for moderate pain.     amLODipine (NORVASC) 10 MG tablet TAKE 1 TABLET BY MOUTH EVERY DAY (Patient taking differently: Take 10 mg by mouth every evening.) 30 tablet 0   aspirin EC 81 MG tablet Take 81 mg by mouth every evening. Swallow whole.     b complex vitamins capsule Take 1 capsule by mouth daily.     dexamethasone (DECADRON) 4 MG tablet Take 2 tablets (8mg ) by mouth daily starting the day after carboplatin for 3 days. Take with food 30 tablet 1   lidocaine-prilocaine (EMLA) cream Apply to affected area once 30 g 3   lisinopril (ZESTRIL) 20 MG tablet TAKE 1 TABLET BY MOUTH EVERY DAY (Patient taking differently: Take 20 mg by mouth every evening.) 30 tablet 0   metFORMIN (GLUCOPHAGE) 500 MG tablet Take 500 mg by mouth daily.     Multiple Vitamins-Minerals (MULTI ADULT GUMMIES PO) Take 1 tablet by mouth in the morning. Centrum     ondansetron (ZOFRAN) 8 MG tablet Take 1 tablet (8 mg total) by mouth every 8 (eight) hours as needed for nausea or vomiting. Start on the third day  after carboplatin. 30 tablet 1   oxyCODONE (OXY IR/ROXICODONE) 5 MG immediate release tablet Take 1 tablet (5 mg total) by mouth every 6 (six)  hours as needed for moderate pain or breakthrough pain. 20 tablet 0   prochlorperazine (COMPAZINE) 10 MG tablet Take 1 tablet (10 mg total) by mouth every 6 (six) hours as needed for nausea or vomiting. 30 tablet 1   tadalafil (CIALIS) 20 MG tablet Take 20 mg by mouth daily as needed for erectile dysfunction.     No current facility-administered medications for this visit.    REVIEW OF SYSTEMS:   10 Point review of Systems was done is negative except as noted above.  PHYSICAL EXAMINATION: .BP 134/85   Pulse 83   Temp 98.2 F (36.8 C)   Resp 20   Wt (!) 309 lb 9.6 oz (140.4 kg)   SpO2 99%   BMI 41.99 kg/m  . GENERAL:alert, in no acute distress and comfortable SKIN: no acute rashes, no significant lesions EYES: conjunctiva are pink and non-injected, sclera anicteric OROPHARYNX: MMM, no exudates, no oropharyngeal erythema or ulceration NECK: supple, no JVD LYMPH:  no palpable lymphadenopathy in the cervical, axillary or inguinal regions LUNGS: clear to auscultation b/l with normal respiratory effort HEART: regular rate & rhythm ABDOMEN:  normoactive bowel sounds , non tender, not distended. Extremity: no pedal edema PSYCH: alert & oriented x 3 with fluent speech NEURO: no focal motor/sensory deficits   LABORATORY DATA:   I have reviewed the data as listed    Latest Ref Rng & Units 09/26/2022    1:40 PM 09/05/2022    1:53 PM 08/29/2022    2:28 PM  CBC  WBC 4.0 - 10.5 K/uL 4.8  5.9  9.1   Hemoglobin 13.0 - 17.0 g/dL 30.8  65.7  84.6   Hematocrit 39.0 - 52.0 % 31.9  33.4  32.3   Platelets 150 - 400 K/uL 455  297  335    CBC    Component Value Date/Time   WBC 4.8 09/26/2022 1340   WBC 6.5 07/19/2022 1400   RBC 3.49 (L) 09/26/2022 1340   HGB 10.2 (L) 09/26/2022 1340   HCT 31.9 (L) 09/26/2022 1340   PLT 455 (H) 09/26/2022 1340   MCV 91.4 09/26/2022 1340   MCV 87.3 12/22/2017 1518   MCH 29.2 09/26/2022 1340   MCHC 32.0 09/26/2022 1340   RDW 16.0 (H) 09/26/2022 1340    LYMPHSABS 2.9 09/26/2022 1340   MONOABS 0.6 09/26/2022 1340   EOSABS 0.0 09/26/2022 1340   BASOSABS 0.0 09/26/2022 1340       Latest Ref Rng & Units 09/26/2022    1:40 PM 09/05/2022    1:53 PM 08/29/2022    2:28 PM  CMP  Glucose 70 - 99 mg/dL 962  952  841   BUN 8 - 23 mg/dL 10  11  16    Creatinine 0.61 - 1.24 mg/dL 3.24  4.01  0.27   Sodium 135 - 145 mmol/L 138  137  137   Potassium 3.5 - 5.1 mmol/L 4.2  4.2  4.1   Chloride 98 - 111 mmol/L 104  103  99   CO2 22 - 32 mmol/L 27  27  31    Calcium 8.9 - 10.3 mg/dL 9.5  25.3  66.4   Total Protein 6.5 - 8.1 g/dL 7.1  7.4  7.1   Total Bilirubin 0.3 - 1.2 mg/dL 0.4  0.4  0.4   Alkaline Phos 38 - 126  U/L 70  78  74   AST 15 - 41 U/L 12  12  9    ALT 0 - 44 U/L 12  11  9     . Lab Results  Component Value Date   LDH 168 05/18/2022   Tsh 1.42 (05/21/2021)   01/02/18 Left Inguinal LN Bx:   12/26/17 Tissue Flow Cytometry:   12/26/17 Inguinal Core biopsy:      RADIOGRAPHIC STUDIES: I have personally reviewed the radiological images as listed and agreed with the findings in the report. DG Chest 2 View  Result Date: 08/16/2022 CLINICAL DATA:  Paratracheal adenopathy EXAM: CHEST - 2 VIEW COMPARISON:  07/19/2022, PET CT 06/08/2022, CT chest 05/05/2022 FINDINGS: Right-sided central venous port tip at the SVC. Linear atelectasis left base. No acute airspace disease, pleural effusion, or pneumothorax. Tracheal deviation to the right secondary to left paratracheal opacity corresponding to history of enlarged lymph nodes. IMPRESSION: 1. Right-sided central venous port with tip over the SVC. 2. Left paratracheal opacity with tracheal deviation to the right corresponding to history of left paratracheal enlarged lymph node 3. Borderline cardiomegaly.  Linear atelectasis left base. Electronically Signed   By: Jasmine Pang M.D.   On: 08/16/2022 15:31   IR IMAGING GUIDED PORT INSERTION  Result Date: 08/10/2022 CLINICAL DATA:  Head and neck carcinoma,  needs durable venous access for planned treatment regimen EXAM: TUNNELED PORT CATHETER PLACEMENT WITH ULTRASOUND AND FLUOROSCOPIC GUIDANCE FLUOROSCOPY: Radiation Exposure Index (as provided by the fluoroscopic device): 3 mGy air Kerma ANESTHESIA/SEDATION: Intravenous Fentanyl and Versed 2mg  were administered as conscious sedation during continuous monitoring of the patient's level of consciousness and physiological / cardiorespiratory status by the radiology RN, with a total moderate sedation time of 14 minutes. TECHNIQUE: The procedure, risks, benefits, and alternatives were explained to the patient. Questions regarding the procedure were encouraged and answered. The patient understands and consents to the procedure. Patency of the right IJ vein was confirmed with ultrasound with image documentation. An appropriate skin site was determined. Skin site was marked. Region was prepped using maximum barrier technique including cap and mask, sterile gown, sterile gloves, large sterile sheet, and Chlorhexidine as cutaneous antisepsis. The region was infiltrated locally with 1% lidocaine. Under real-time ultrasound guidance, the right IJ vein was accessed with a 21 gauge micropuncture needle; the needle tip within the vein was confirmed with ultrasound image documentation. Needle was exchanged over a 018 guidewire for transitional dilator, and vascular measurement was performed. A small incision was made on the right anterior chest wall and a subcutaneous pocket fashioned. The power-injectable port was positioned and its catheter tunneled to the right IJ dermatotomy site. The transitional dilator was exchanged over an Amplatz wire for a peel-away sheath, through which the port catheter, which had been trimmed to the appropriate length, was advanced and positioned under fluoroscopy with its tip at the cavoatrial junction. Spot chest radiograph confirms good catheter position and no pneumothorax. The port was flushed  per protocol. The pocket was closed with deep interrupted and subcuticular continuous 3-0 Monocryl sutures. The incisions were covered with Dermabond then covered with a sterile dressing. The patient tolerated the procedure well. COMPLICATIONS: COMPLICATIONS None immediate IMPRESSION: Technically successful right IJ power-injectable port catheter placement. Ready for routine use. Electronically Signed   By: Corlis Leak M.D.   On: 08/10/2022 19:16    ASSESSMENT & PLAN:   This is a pleasant 67 y.o. 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   CT of the abdomen and pelvis performed on 12/22/2017 showed bulky left inguinal, left hemipelvic, and  retroperitoneal adenopathy.     01/02/18 Left inguinal LN Biopsy revealed T-Cell/histocyte rich Large B-Cell Lymphoma   12/27/17 ECHO revealed LV EF of 55-60%    01/05/18 PET/CT revealed Massively enlarged pelvic lymph nodes intense metabolic activity consistent lymphoma. 2. Additional hypermetabolic lymph nodes in the porta hepatis and retroperitoneum LEFT aorta. 3. Solitary hypermetabolic mediastinal lymph node in the upper LEFT Mediastinum. 4. 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 5. Normal spleen.     HIV non reactive on 12/22/2017.  Hep C and hep B serology negative.   03/14/18 PET/CT revealed PET-CT findings suggest an excellent response to chemotherapy. The abdominal lymphadenopathy has near completely resolved and demonstrates a near complete metabolic response. The pelvic and inguinal adenopathy has significantly decreased in size and the metabolic activity has significantly decreased. 2. Diffuse marrow activity likely due to chemotherapy and or marrow  stimulating drugs. I do not see any discrete persistent lesions.    04/17/18 CT Head revealed Subtle mesial caudothalamic hypodensities may be artifact though, the could reflect encephalitis or Wernicke's encephalopathy. Consider MRI of the head with and without contrast. 2. Mild chronic small vessel ischemic changes    S/p 6 cycles of EPOCH-R completed on 06/01/18  06/28/18 PET/CT revealed Continued good response to treatment. No residual measurable or hypermetabolic abdominal lymphadenopathy and no recurrent osseous disease. 2. Interval decrease in size of the left operator region lymph node and also the left inguinal lymph node. However, the both have small foci of slightly increased hypermetabolism which bears surveillance. 3. No new or progressive lymphadenopathy in the neck, chest, abdomen or pelvis.  S/p 39.6 Gy in 22 fractions between 07/25/18 and 08/23/18  02/13/2019 CT C/A/P ([id number]) ([id number]) revealed "1. Response to therapy of pelvic adenopathy compared to the PET of 06/28/2018. 2. No new or progressive disease. 3. Mild prostatomegaly. 4. 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 5. Hepatomegaly."   2) left lower extremity swelling- now resolved  Doppler ultrasound for DVT was negative in the left lower extremity.   Likely from venous compression +/- lymphatic obstruction from bulky left inguinal, left hemipelvic, and  retroperitoneal adenopathy.    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  PLAN: -Discussed lab results from today, 09/26/2022, with the patient.  CBC shows decreased hemoglobin at 10.2 g/dL, decreased hematocrit at 31.9%, and elevated platelets at 455 K. CMP shows elevated glucose at 144.  -Schedule the PET scan after cycle 4. -Discussed that we will drop chemotherapy and just continue with immunotherapy if PET scan does not show any abnormalities. And will consider ISRT to can residual lesions -Patient tolerated cycle 2 of his treatment well without any new or severe toxicities.  -Patient can proceed with cycle 3 of his treatment during this visit as planned without any dose modification.  Orders reviewed and signed. -recommended to stay hydrated and exercise.  FOLLOW-UP: F/u as per scheduled C4 appointment for treatment.  The total time spent in the appointment was 30 minutes* .  All of the patient's questions were answered with apparent satisfaction. The patient knows to call the clinic with any problems, questions or concerns.   Wyvonnia Lora MD MS AAHIVMS [facility] Hematology/Oncology Physician [facility]  .*Total Encounter Time as defined by the Centers for Medicare and Medicaid Services includes, in addition to the face-to-face time of a patient visit (documented in the note above) non-face-to-face time: obtaining and reviewing outside history, ordering and reviewing medications, tests or procedures, care coordination (communications with other health care professionals or caregivers) and documentation in the medical record.   I, Ok Edwards, am acting as a Neurosurgeon for Wyvonnia Lora, MD. .I have reviewed the above documentation for accuracy and completeness, and I agree with the above. Johney Maine MD

## 2022-09-26 NOTE — Patient Instructions (Addendum)
[facility]  Discharge Instructions: Thank you for choosing [facility] to provide your oncology and hematology care.   If you have a lab appointment with the Cancer Center, please go directly to the Cancer Center and check in at the registration area.   Wear comfortable clothing and clothing appropriate for easy access to any Portacath or PICC line.   We strive to give you quality time with your provider. You may need to reschedule your appointment if you arrive late (15 or more minutes).  Arriving late affects you and other patients whose appointments are after yours.  Also, if you miss three or more appointments without notifying the office, you may be dismissed from the clinic at the provider's discretion.      For prescription refill requests, have your pharmacy contact our office and allow 72 hours for refills to be completed.    Today you received the following chemotherapy and/or immunotherapy agents: Pembrolizumab (Keytruda), Carboplatin, and Fluorouracil.    To help prevent nausea and vomiting after your treatment, we encourage you to take your nausea medication as directed.  BELOW ARE SYMPTOMS THAT SHOULD BE REPORTED IMMEDIATELY: *FEVER GREATER THAN 100.4 F (38 C) OR HIGHER *CHILLS OR SWEATING *NAUSEA AND VOMITING THAT IS NOT CONTROLLED WITH YOUR NAUSEA MEDICATION *UNUSUAL SHORTNESS OF BREATH *UNUSUAL BRUISING OR BLEEDING *URINARY PROBLEMS (pain or burning when urinating, or frequent urination) *BOWEL PROBLEMS (unusual diarrhea, constipation, pain near the anus) TENDERNESS IN MOUTH AND THROAT WITH OR WITHOUT PRESENCE OF ULCERS (sore throat, sores in mouth, or a toothache) UNUSUAL RASH, SWELLING OR PAIN  UNUSUAL VAGINAL DISCHARGE OR ITCHING   Items with * indicate a potential emergency and should be followed up as soon as possible or go to the Emergency Department if any problems should occur.  Please show the CHEMOTHERAPY  ALERT CARD or IMMUNOTHERAPY ALERT CARD at check-in to the Emergency Department and triage nurse.  Should you have questions after your visit or need to cancel or reschedule your appointment, please contact [facility]  Dept: [phone]  and follow the prompts.  Office hours are 8:00 a.m. to 4:30 p.m. Monday - Friday. Please note that voicemails left after 4:00 p.m. may not be returned until the following business day.  We are closed weekends and major holidays. You have access to a nurse at all times for urgent questions. Please call the main number to the clinic Dept: [phone] and follow the prompts.   For any non-urgent questions, you may also contact your provider using MyChart. We now offer e-Visits for anyone 48 and older to request care online for non-urgent symptoms. For details visit [email or web address].   Also download the MyChart app! Go to the app store, search "MyChart", open the app, select [facility], and log in with your MyChart username and password.  The chemotherapy medication bag should finish at 46 hours, 96 hours, or 7 days. For example, if your pump is scheduled for 46 hours and it was put on at 4:00 p.m., it should finish at 2:00 p.m. the day it is scheduled to come off regardless of your appointment time.     Estimated time to finish at:  3:00 PM   If the display on your pump reads "Low Volume" and it is beeping, take the batteries out of the pump and come to the cancer center for it to be taken off.   If the pump alarms go  off prior to the pump reading "Low Volume" then call [phone] and someone can assist you.  If the plunger comes out and the chemotherapy medication is leaking out, please use your home chemo spill kit to clean up the spill. Do NOT use paper towels or other household products.  If you have problems or questions regarding your pump, please call either [phone] (24 hours a day) or the cancer center  Monday-Friday 8:00 a.m.- 4:30 p.m. at the clinic number and we will assist you. If you are unable to get assistance, then go to the nearest Emergency Department and ask the staff to contact the IV team for assistance.

## 2022-09-27 LAB — T4: T4, Total: 8.2 ug/dL (ref 4.5–12.0)

## 2022-09-30 ENCOUNTER — Inpatient Hospital Stay: Payer: Medicare HMO

## 2022-09-30 VITALS — BP 108/77 | HR 94 | Temp 98.6°F | Resp 20

## 2022-09-30 DIAGNOSIS — Z5111 Encounter for antineoplastic chemotherapy: Secondary | ICD-10-CM | POA: Diagnosis not present

## 2022-09-30 DIAGNOSIS — Z5112 Encounter for antineoplastic immunotherapy: Secondary | ICD-10-CM | POA: Diagnosis not present

## 2022-09-30 DIAGNOSIS — C320 Malignant neoplasm of glottis: Secondary | ICD-10-CM | POA: Diagnosis not present

## 2022-09-30 DIAGNOSIS — C760 Malignant neoplasm of head, face and neck: Secondary | ICD-10-CM

## 2022-09-30 DIAGNOSIS — Z79899 Other long term (current) drug therapy: Secondary | ICD-10-CM | POA: Diagnosis not present

## 2022-09-30 DIAGNOSIS — Z7189 Other specified counseling: Secondary | ICD-10-CM

## 2022-09-30 MED ORDER — HEPARIN SOD (PORK) LOCK FLUSH 100 UNIT/ML IV SOLN
500.0000 [IU] | Freq: Once | INTRAVENOUS | Status: AC | PRN
  Administered 2022-09-30: 500 [IU]

## 2022-09-30 MED ORDER — SODIUM CHLORIDE 0.9% FLUSH
10.0000 mL | INTRAVENOUS | Status: DC | PRN
  Administered 2022-09-30: 10 mL

## 2022-10-02 ENCOUNTER — Encounter: Payer: Self-pay | Admitting: Hematology

## 2022-10-14 MED FILL — Fosaprepitant Dimeglumine For IV Infusion 150 MG (Base Eq): INTRAVENOUS | Qty: 5 | Status: AC

## 2022-10-14 MED FILL — Dexamethasone Sodium Phosphate Inj 100 MG/10ML: INTRAMUSCULAR | Qty: 1 | Status: AC

## 2022-10-17 ENCOUNTER — Other Ambulatory Visit: Payer: Self-pay

## 2022-10-17 ENCOUNTER — Inpatient Hospital Stay: Payer: Medicare HMO | Attending: Hematology | Admitting: Hematology

## 2022-10-17 ENCOUNTER — Inpatient Hospital Stay: Payer: Medicare HMO

## 2022-10-17 VITALS — BP 130/88 | HR 86 | Temp 98.0°F | Resp 18 | Ht 72.0 in | Wt 296.1 lb

## 2022-10-17 DIAGNOSIS — Z5112 Encounter for antineoplastic immunotherapy: Secondary | ICD-10-CM | POA: Insufficient documentation

## 2022-10-17 DIAGNOSIS — Z7189 Other specified counseling: Secondary | ICD-10-CM | POA: Diagnosis not present

## 2022-10-17 DIAGNOSIS — Z803 Family history of malignant neoplasm of breast: Secondary | ICD-10-CM | POA: Insufficient documentation

## 2022-10-17 DIAGNOSIS — Z8572 Personal history of non-Hodgkin lymphomas: Secondary | ICD-10-CM | POA: Insufficient documentation

## 2022-10-17 DIAGNOSIS — B20 Human immunodeficiency virus [HIV] disease: Secondary | ICD-10-CM | POA: Diagnosis not present

## 2022-10-17 DIAGNOSIS — C320 Malignant neoplasm of glottis: Secondary | ICD-10-CM | POA: Diagnosis not present

## 2022-10-17 DIAGNOSIS — Z800 Family history of malignant neoplasm of digestive organs: Secondary | ICD-10-CM | POA: Insufficient documentation

## 2022-10-17 DIAGNOSIS — C760 Malignant neoplasm of head, face and neck: Secondary | ICD-10-CM

## 2022-10-17 DIAGNOSIS — Z79899 Other long term (current) drug therapy: Secondary | ICD-10-CM | POA: Insufficient documentation

## 2022-10-17 DIAGNOSIS — Z7982 Long term (current) use of aspirin: Secondary | ICD-10-CM | POA: Diagnosis not present

## 2022-10-17 DIAGNOSIS — Z5111 Encounter for antineoplastic chemotherapy: Secondary | ICD-10-CM | POA: Insufficient documentation

## 2022-10-17 DIAGNOSIS — C8338 Diffuse large B-cell lymphoma, lymph nodes of multiple sites: Secondary | ICD-10-CM | POA: Diagnosis not present

## 2022-10-17 DIAGNOSIS — Z95828 Presence of other vascular implants and grafts: Secondary | ICD-10-CM

## 2022-10-17 LAB — CBC WITH DIFFERENTIAL (CANCER CENTER ONLY)
Abs Immature Granulocytes: 0.02 K/uL (ref 0.00–0.07)
Basophils Absolute: 0 K/uL (ref 0.0–0.1)
Basophils Relative: 1 %
Eosinophils Absolute: 0.1 K/uL (ref 0.0–0.5)
Eosinophils Relative: 1 %
HCT: 31.6 % — ABNORMAL LOW (ref 39.0–52.0)
Hemoglobin: 10.2 g/dL — ABNORMAL LOW (ref 13.0–17.0)
Immature Granulocytes: 0 %
Lymphocytes Relative: 46 %
Lymphs Abs: 2.2 K/uL (ref 0.7–4.0)
MCH: 30.4 pg (ref 26.0–34.0)
MCHC: 32.3 g/dL (ref 30.0–36.0)
MCV: 94 fL (ref 80.0–100.0)
Monocytes Absolute: 0.6 K/uL (ref 0.1–1.0)
Monocytes Relative: 12 %
Neutro Abs: 1.9 K/uL (ref 1.7–7.7)
Neutrophils Relative %: 40 %
Platelet Count: 346 K/uL (ref 150–400)
RBC: 3.36 MIL/uL — ABNORMAL LOW (ref 4.22–5.81)
RDW: 16.8 % — ABNORMAL HIGH (ref 11.5–15.5)
WBC Count: 4.8 K/uL (ref 4.0–10.5)
nRBC: 0 % (ref 0.0–0.2)

## 2022-10-17 LAB — CMP (CANCER CENTER ONLY)
ALT: 9 U/L (ref 0–44)
AST: 12 U/L — ABNORMAL LOW (ref 15–41)
Albumin: 4.3 g/dL (ref 3.5–5.0)
Alkaline Phosphatase: 64 U/L (ref 38–126)
Anion gap: 6 (ref 5–15)
BUN: 12 mg/dL (ref 8–23)
CO2: 28 mmol/L (ref 22–32)
Calcium: 9.2 mg/dL (ref 8.9–10.3)
Chloride: 105 mmol/L (ref 98–111)
Creatinine: 1.01 mg/dL (ref 0.61–1.24)
GFR, Estimated: >60 mL/min (ref >60)
Glucose, Bld: 170 mg/dL — ABNORMAL HIGH (ref 70–99)
Potassium: 4.1 mmol/L (ref 3.5–5.1)
Sodium: 139 mmol/L (ref 135–145)
Total Bilirubin: 0.5 mg/dL (ref 0.3–1.2)
Total Protein: 7.4 g/dL (ref 6.5–8.1)

## 2022-10-17 MED ORDER — SODIUM CHLORIDE 0.9 % IV SOLN: Freq: Once | INTRAVENOUS | Status: AC

## 2022-10-17 MED ORDER — SODIUM CHLORIDE 0.9 % IV SOLN
800.0000 mg/m2/d | INTRAVENOUS | Status: DC
  Administered 2022-10-17: 8500 mg via INTRAVENOUS
  Filled 2022-10-17: qty 170

## 2022-10-17 MED ORDER — PALONOSETRON HCL INJECTION 0.25 MG/5ML
0.2500 mg | Freq: Once | INTRAVENOUS | Status: AC
  Administered 2022-10-17: 0.25 mg via INTRAVENOUS
  Filled 2022-10-17: qty 5

## 2022-10-17 MED ORDER — SODIUM CHLORIDE 0.9% FLUSH
10.0000 mL | INTRAVENOUS | Status: DC | PRN
  Administered 2022-10-17: 10 mL

## 2022-10-17 MED ORDER — SODIUM CHLORIDE 0.9 % IV SOLN
10.0000 mg | Freq: Once | INTRAVENOUS | Status: AC
  Administered 2022-10-17: 10 mg via INTRAVENOUS
  Filled 2022-10-17: qty 10

## 2022-10-17 MED ORDER — SODIUM CHLORIDE 0.9% FLUSH
10.0000 mL | Freq: Once | INTRAVENOUS | Status: AC
  Administered 2022-10-17: 10 mL

## 2022-10-17 MED ORDER — SODIUM CHLORIDE 0.9 % IV SOLN
150.0000 mg | Freq: Once | INTRAVENOUS | Status: AC
  Administered 2022-10-17: 150 mg via INTRAVENOUS
  Filled 2022-10-17: qty 150

## 2022-10-17 MED ORDER — SODIUM CHLORIDE 0.9 % IV SOLN
200.0000 mg | Freq: Once | INTRAVENOUS | Status: AC
  Administered 2022-10-17: 200 mg via INTRAVENOUS
  Filled 2022-10-17: qty 200

## 2022-10-17 MED ORDER — SODIUM CHLORIDE 0.9 % IV SOLN
600.0000 mg | Freq: Once | INTRAVENOUS | Status: AC
  Administered 2022-10-17: 600 mg via INTRAVENOUS
  Filled 2022-10-17: qty 60

## 2022-10-17 MED ORDER — HEPARIN SOD (PORK) LOCK FLUSH 100 UNIT/ML IV SOLN: 500.0000 [IU] | Freq: Once | INTRAVENOUS | Status: DC | PRN

## 2022-10-17 NOTE — Patient Instructions (Signed)
[facility]  Discharge Instructions: Thank you for choosing [facility] to provide your oncology and hematology care.   If you have a lab appointment with the Cancer Center, please go directly to the Cancer Center and check in at the registration area.   Wear comfortable clothing and clothing appropriate for easy access to any Portacath or PICC line.   We strive to give you quality time with your provider. You may need to reschedule your appointment if you arrive late (15 or more minutes).  Arriving late affects you and other patients whose appointments are after yours.  Also, if you miss three or more appointments without notifying the office, you may be dismissed from the clinic at the provider's discretion.      For prescription refill requests, have your pharmacy contact our office and allow 72 hours for refills to be completed.    Today you received the following chemotherapy and/or immunotherapy agents Keytruda, Carboplatin, Fluorouracil.      To help prevent nausea and vomiting after your treatment, we encourage you to take your nausea medication as directed.  BELOW ARE SYMPTOMS THAT SHOULD BE REPORTED IMMEDIATELY: *FEVER GREATER THAN 100.4 F (38 C) OR HIGHER *CHILLS OR SWEATING *NAUSEA AND VOMITING THAT IS NOT CONTROLLED WITH YOUR NAUSEA MEDICATION *UNUSUAL SHORTNESS OF BREATH *UNUSUAL BRUISING OR BLEEDING *URINARY PROBLEMS (pain or burning when urinating, or frequent urination) *BOWEL PROBLEMS (unusual diarrhea, constipation, pain near the anus) TENDERNESS IN MOUTH AND THROAT WITH OR WITHOUT PRESENCE OF ULCERS (sore throat, sores in mouth, or a toothache) UNUSUAL RASH, SWELLING OR PAIN  UNUSUAL VAGINAL DISCHARGE OR ITCHING   Items with * indicate a potential emergency and should be followed up as soon as possible or go to the Emergency Department if any problems should occur.  Please show the CHEMOTHERAPY ALERT CARD or  IMMUNOTHERAPY ALERT CARD at check-in to the Emergency Department and triage nurse.  Should you have questions after your visit or need to cancel or reschedule your appointment, please contact [facility]  Dept: [phone]  and follow the prompts.  Office hours are 8:00 a.m. to 4:30 p.m. Monday - Friday. Please note that voicemails left after 4:00 p.m. may not be returned until the following business day.  We are closed weekends and major holidays. You have access to a nurse at all times for urgent questions. Please call the main number to the clinic Dept: [phone] and follow the prompts.   For any non-urgent questions, you may also contact your provider using MyChart. We now offer e-Visits for anyone 37 and older to request care online for non-urgent symptoms. For details visit [email or web address].   Also download the MyChart app! Go to the app store, search "MyChart", open the app, select [name], and log in with your MyChart username and password.

## 2022-10-17 NOTE — Progress Notes (Signed)
[facility]   [phone] [phone]   CLINIC Notes  Date of Service:  10/17/22  Patient Care Team: Johny Blamer, MD as PCP - General (Family Medicine) Wendall Stade, MD as PCP - Cardiology (Cardiology) Lonie Peak, MD as Consulting Physician (Radiation Oncology) Malmfelt, Lise Auer, RN as Oncology Nurse Navigator Osborn Coho, MD (Inactive) as Consulting Physician (Otolaryngology) Johney Maine, MD as Attending Physician (Hematology)    CHIEF COMPLAINTS/PURPOSE OF CONSULTATION:  F/u for continued evaluation and mx of 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  HISTORY OF PRESENTING ILLNESS:   Jesus [name] 67 y.o. male is here because of left lower extremity edema and lymphadenopathy.  The patient was seen in the emergency room this past Friday for the same issue.  A CT of the abdomen and pelvis was performed showing bulky left inguinal, left hemipelvic, and retroperitoneal adenopathy.  He was referred to [place] from the emergency room for further evaluation.  Doppler ultrasound of the left lower extremity was performed and was negative for DVT. Patient reports that he has been having left lower extremity edema in his left groin and left leg for approximately 1 month.  He states that the swelling in the left groin started to get better but then worsened.  The left lower extremity edema has slowly worsened over time.  Patient denies having fevers and chills.  He reports that he does have night sweats at times.  He reported having headaches approximate 1 [place]  He thinks his headaches are related to not having his blood pressure medication.  His headaches have now resolved.  He denies visual changes.  The patient denies chest pain, shortness of breath and cough.  No nausea, vomiting, constipation, diarrhea.  Denies abdominal pain.  The patient denies recent weight loss and has actually  gained weight recently.  Patient denies epistaxis, bleeding gums, hemoptysis, hematuria, but occasionally, and melena.  He reports increased urinary frequency over the past month but no dysuria.  The patient is here for evaluation and discussion of his recent CT and lab findings.  Interval History:  Jesus [name] 67 y.o. male who is here for continued evaluation and management of newly noted metastatic laryngeal squamous cell carcinoma. He is here for cycle 4 day 1 of carbo/5FU/Pembrolizumab.  Patient was last seen by me on 09/26/2022 and he complained of mild fatigue and occasional bilateral hand and leg neuropathy due to Pembrolizumab and swallowing problems when swallowing medication.   Patient notes he has been doing well overall without any severe medical concerns since our last visit. Patient reports weight loss due to changing his diet. Patient notes his voice has improved since our last visit.  Patient tolerated cycle 3 of his treatment well without any new or severe toxicities. He notes that his nausea improved after starting steroids.   He is staying well hydrated and exercising.  He notes his blood pressure is stable overall at home, except 2-3 episodes of low blood pressure around 98-100.     Patient reports of mild headaches in the back of the head, which improved when he discontinued Tylenol. He notes of one episode intermittent chest pain, which resolved on it's own.   He notes pain in right lower extremities due to ingrown toenail and will schedule surgery after he finishes treatment.   Patient notes he has discontinued alcohol intake.   He denies any new infection issues, fever, chills, night sweats,  unexpected weight loss, new lumps/bumps, shortness of breath, abnormal bowel movement, back pain, chest pain, abdominal pain, or leg swelling.    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- last radiation 06-2018   Cataract    removed both eyes with l;ens implants    COPD (chronic obstructive pulmonary disease) (HCC)    Family history of colon cancer    in his brother- dx'd age 50    History of chemotherapy    last 06-01-2018   History of kidney stones    History of radiation therapy    last radiation 06-2018   Hyperlipidemia    currently under control   Hypertension    Irregular heart beats    Lymphadenopathy    Pain, lower leg    Bilateral   Peripheral arterial disease (HCC)    Pre-diabetes    Red-green color blindness    RLS (restless legs syndrome)    Snores    Wears glasses     SURGICAL HISTORY: Past Surgical History:  Procedure Laterality Date   CATARACT EXTRACTION W/ INTRAOCULAR LENS  IMPLANT, BILATERAL     COLONOSCOPY     DIRECT LARYNGOSCOPY Right 02/03/2022   Procedure: DIRECT LARYNGOSCOPY WITH BIOPSY OF RIGHT FALSE VOCAL CORD;  Surgeon: Osborn Coho, MD;  Location: [facility] OR;  Service: ENT;  Laterality: Right;   dislodged salava stone     FRACTURE SURGERY     HAND ARTHROPLASTY  1995   crushed left hand   INGUINAL LYMPH NODE BIOPSY Left 01/02/2018   Procedure: LEFT INGUINAL LYMPH NODE BIOPSY;  Surgeon: Emelia Loron, MD;  Location: [facility] OR;  Service: General;  Laterality: Left;   IR IMAGING GUIDED PORT INSERTION  01/15/2018   IR IMAGING GUIDED PORT INSERTION  08/10/2022   IR REMOVAL TUN ACCESS W/ PORT W/O FL MOD SED  03/11/2019   MEDIASTINOSCOPY N/A 07/22/2022   Procedure: MEDIASTINOSCOPY;  Surgeon: Loreli Slot, MD;  Location: [facility] OR;  Service: Thoracic;  Laterality: N/A;   MICROLARYNGOSCOPY Left 01/17/2014   Procedure: MICROLARYNGOSCOPY WITH EXCISION OF THE BIOPSY OF LEFT VOCAL CORD LESION;  Surgeon: Serena Colonel, MD;  Location: [facility];  Service: ENT;  Laterality: Left;   MICROLARYNGOSCOPY N/A  09/16/2020   Procedure: MICROLARYNGOSCOPY with Biopsy of vocal cord lesion;  Surgeon: Osborn Coho, MD;  Location: [facility] OR;  Service: ENT;  Laterality: N/A;   ORIF FOOT FRACTURE  2005   left   REFRACTIVE SURGERY Right    removed cloudiness in right eye after cataract removal     SOCIAL HISTORY: Social History   Socioeconomic History   Marital status: Divorced    Spouse name: Not on file   Number of children: 3   Years of education: Not on file   Highest education level: Not on file  Occupational History   Not on file  Tobacco Use   Smoking status: Former    Packs/day: 0.50    Years: 36.00    Additional pack years: 0.00    Total pack years: 18.00    Types: Cigarettes    Quit date: 09/28/2020    Years since quitting: 1.9   Smokeless tobacco: Never   Tobacco comments:  he denies smoking in about 2 weeks 07/13/18  Vaping Use   Vaping Use: Never used  Substance and Sexual Activity   Alcohol use: Yes    Alcohol/week: 6.0 standard drinks of alcohol    Types: 6 Cans of beer per week    Comment: weekends   Drug use: Yes    Types: Marijuana, Cocaine    Comment: reports cocaine usage ~2X/ month; last use 07/2022   Sexual activity: Not on file  Other Topics Concern   Not on file  Social History Narrative   Not on file   Social Determinants of Health   Financial Resource Strain: Low Risk  (10/06/2020)   Overall Financial Resource Strain (CARDIA)    Difficulty of Paying Living Expenses: Not very hard  Food Insecurity: No Food Insecurity (10/06/2020)   Hunger Vital Sign    Worried About Running Out of Food in the Last Year: Never true    Ran Out of Food in the Last Year: Never true  Transportation Needs: No Transportation Needs (10/06/2020)   PRAPARE - Administrator, Civil Service (Medical): No    Lack of Transportation (Non-Medical): No  Physical Activity: Not on file  Stress: Not on file  Social Connections: Moderately Integrated (10/06/2020)   Social  Connection and Isolation Panel [NHANES]    Frequency of Communication with Friends and Family: Three times a week    Frequency of Social Gatherings with Friends and Family: Three times a week    Attends Religious Services: 1 to 4 times per year    Active Member of Clubs or Organizations: Yes    Attends Banker Meetings: 1 to 4 times per year    Marital Status: Divorced  Intimate Partner Violence: Not At Risk (07/13/2018)   Humiliation, Afraid, Rape, and Kick questionnaire    Fear of Current or Ex-Partner: No    Emotionally Abused: No    Physically Abused: No    Sexually Abused: No    FAMILY HISTORY: Family History  Problem Relation Age of Onset   Breast cancer Mother    Diabetes Father    Hypertension Father    Stroke Father    Mental illness Sister    Hypertension Daughter    Mental illness Daughter    Hypertension Brother    Colon cancer Brother 36       passed away 12/11/2018   Breast cancer Sister    Esophageal cancer Neg Hx    Colon polyps Neg Hx    Rectal cancer Neg Hx    Stomach cancer Neg Hx     ALLERGIES:  is allergic to bee venom, antifungal [miconazole nitrate], and zolpidem tartrate er.  MEDICATIONS:  Current Outpatient Medications  Medication Sig Dispense Refill   acetaminophen (TYLENOL) 500 MG tablet Take 1,500 mg by mouth daily as needed for moderate pain.     amLODipine (NORVASC) 10 MG tablet TAKE 1 TABLET BY MOUTH EVERY DAY (Patient taking differently: Take 10 mg by mouth every evening.) 30 tablet 0   aspirin EC 81 MG tablet Take 81 mg by mouth every evening. Swallow whole.     b complex vitamins capsule Take 1 capsule by mouth daily.     dexamethasone (DECADRON) 4 MG tablet Take 2 tablets (8mg ) by mouth daily starting the day after carboplatin for 3 days. Take with food 30 tablet 1   lidocaine-prilocaine (EMLA) cream Apply to affected area once 30 g 3   lisinopril (ZESTRIL) 20 MG tablet  TAKE 1 TABLET BY MOUTH EVERY DAY (Patient taking  differently: Take 20 mg by mouth every evening.) 30 tablet 0   metFORMIN (GLUCOPHAGE) 500 MG tablet Take 500 mg by mouth daily.     Multiple Vitamins-Minerals (MULTI ADULT GUMMIES PO) Take 1 tablet by mouth in the morning. Centrum     ondansetron (ZOFRAN) 8 MG tablet Take 1 tablet (8 mg total) by mouth every 8 (eight) hours as needed for nausea or vomiting. Start on the third day after carboplatin. 30 tablet 1   oxyCODONE (OXY IR/ROXICODONE) 5 MG immediate release tablet Take 1 tablet (5 mg total) by mouth every 6 (six) hours as needed for moderate pain or breakthrough pain. 20 tablet 0   prochlorperazine (COMPAZINE) 10 MG tablet Take 1 tablet (10 mg total) by mouth every 6 (six) hours as needed for nausea or vomiting. 30 tablet 1   tadalafil (CIALIS) 20 MG tablet Take 20 mg by mouth daily as needed for erectile dysfunction.     No current facility-administered medications for this visit.    REVIEW OF SYSTEMS:   10 Point review of Systems was done is negative except as noted above.  PHYSICAL EXAMINATION: .BP 134/85   Pulse 83   Temp 98.2 F (36.8 C)   Resp 20   Wt (!) 309 lb 9.6 oz (140.4 kg)   SpO2 99%   BMI 41.99 kg/m  . GENERAL:alert, in no acute distress and comfortable SKIN: no acute rashes, no significant lesions EYES: conjunctiva are pink and non-injected, sclera anicteric OROPHARYNX: MMM, no exudates, no oropharyngeal erythema or ulceration NECK: supple, no JVD LYMPH:  no palpable lymphadenopathy in the cervical, axillary or inguinal regions LUNGS: clear to auscultation b/l with normal respiratory effort HEART: regular rate & rhythm ABDOMEN:  normoactive bowel sounds , non tender, not distended. Extremity: no pedal edema PSYCH: alert & oriented x 3 with fluent speech NEURO: no focal motor/sensory deficits   LABORATORY DATA:   I have reviewed the data as listed    Latest Ref Rng & Units 10/17/2022   11:04 AM 09/26/2022    1:40 PM 09/05/2022    1:53 PM  CBC  WBC 4.0  - 10.5 K/uL 4.8  4.8  5.9   Hemoglobin 13.0 - 17.0 g/dL 16.1  09.6  04.5   Hematocrit 39.0 - 52.0 % 31.6  31.9  33.4   Platelets 150 - 400 K/uL 346  455  297    CBC    Component Value Date/Time   WBC 4.8 10/17/2022 1104   WBC 6.5 07/19/2022 1400   RBC 3.36 (L) 10/17/2022 1104   HGB 10.2 (L) 10/17/2022 1104   HCT 31.6 (L) 10/17/2022 1104   PLT 346 10/17/2022 1104   MCV 94.0 10/17/2022 1104   MCV 87.3 12/22/2017 1518   MCH 30.4 10/17/2022 1104   MCHC 32.3 10/17/2022 1104   RDW 16.8 (H) 10/17/2022 1104   LYMPHSABS 2.2 10/17/2022 1104   MONOABS 0.6 10/17/2022 1104   EOSABS 0.1 10/17/2022 1104   BASOSABS 0.0 10/17/2022 1104       Latest Ref Rng & Units 10/17/2022   11:04 AM 09/26/2022    1:40 PM 09/05/2022    1:53 PM  CMP  Glucose 70 - 99 mg/dL 409  811  914   BUN 8 - 23 mg/dL 12  10  11    Creatinine 0.61 - 1.24 mg/dL 7.82  9.56  2.13   Sodium 135 - 145 mmol/L 139  138  137  Potassium 3.5 - 5.1 mmol/L 4.1  4.2  4.2   Chloride 98 - 111 mmol/L 105  104  103   CO2 22 - 32 mmol/L 28  27  27    Calcium 8.9 - 10.3 mg/dL 9.2  9.5  66.0   Total Protein 6.5 - 8.1 g/dL 7.4  7.1  7.4   Total Bilirubin 0.3 - 1.2 mg/dL 0.5  0.4  0.4   Alkaline Phos 38 - 126 U/L 64  70  78   AST 15 - 41 U/L 12  12  12    ALT 0 - 44 U/L 9  12  11     . Lab Results  Component Value Date   LDH 168 05/18/2022   Tsh 1.42 (05/21/2021)   01/02/18 Left Inguinal LN Bx:   12/26/17 Tissue Flow Cytometry:   12/26/17 Inguinal Core biopsy:      RADIOGRAPHIC STUDIES: I have personally reviewed the radiological images as listed and agreed with the findings in the report. DG Chest 2 View  Result Date: 08/16/2022 CLINICAL DATA:  Paratracheal adenopathy EXAM: CHEST - 2 VIEW COMPARISON:  07/19/2022, PET CT 06/08/2022, CT chest 05/05/2022 FINDINGS: Right-sided central venous port tip at the SVC. Linear atelectasis left base. No acute airspace disease, pleural effusion, or pneumothorax. Tracheal deviation to the  right secondary to left paratracheal opacity corresponding to history of enlarged lymph nodes. IMPRESSION: 1. Right-sided central venous port with tip over the SVC. 2. Left paratracheal opacity with tracheal deviation to the right corresponding to history of left paratracheal enlarged lymph node 3. Borderline cardiomegaly.  Linear atelectasis left base. Electronically Signed   By: Jasmine Pang M.D.   On: 08/16/2022 15:31   IR IMAGING GUIDED PORT INSERTION  Result Date: 08/10/2022 CLINICAL DATA:  Head and neck carcinoma, needs durable venous access for planned treatment regimen EXAM: TUNNELED PORT CATHETER PLACEMENT WITH ULTRASOUND AND FLUOROSCOPIC GUIDANCE FLUOROSCOPY: Radiation Exposure Index (as provided by the fluoroscopic device): 3 mGy air Kerma ANESTHESIA/SEDATION: Intravenous Fentanyl and Versed 2mg  were administered as conscious sedation during continuous monitoring of the patient's level of consciousness and physiological / cardiorespiratory status by the radiology RN, with a total moderate sedation time of 14 minutes. TECHNIQUE: The procedure, risks, benefits, and alternatives were explained to the patient. Questions regarding the procedure were encouraged and answered. The patient understands and consents to the procedure. Patency of the right IJ vein was confirmed with ultrasound with image documentation. An appropriate skin site was determined. Skin site was marked. Region was prepped using maximum barrier technique including cap and mask, sterile gown, sterile gloves, large sterile sheet, and Chlorhexidine as cutaneous antisepsis. The region was infiltrated locally with 1% lidocaine. Under real-time ultrasound guidance, the right IJ vein was accessed with a 21 gauge micropuncture needle; the needle tip within the vein was confirmed with ultrasound image documentation. Needle was exchanged over a 018 guidewire for transitional dilator, and vascular measurement was performed. A small incision  was made on the right anterior chest wall and a subcutaneous pocket fashioned. The power-injectable port was positioned and its catheter tunneled to the right IJ dermatotomy site. The transitional dilator was exchanged over an Amplatz wire for a peel-away sheath, through which the port catheter, which had been trimmed to the appropriate length, was advanced and positioned under fluoroscopy with its tip at the cavoatrial junction. Spot chest radiograph confirms good catheter position and no pneumothorax. The port was flushed per protocol. The pocket was closed with deep interrupted and subcuticular  continuous 3-0 Monocryl sutures. The incisions were covered with Dermabond then covered with a sterile dressing. The patient tolerated the procedure well. COMPLICATIONS: COMPLICATIONS None immediate IMPRESSION: Technically successful right IJ power-injectable port catheter placement. Ready for routine use. Electronically Signed   By: Corlis Leak M.D.   On: 08/10/2022 19:16    ASSESSMENT & PLAN:   This is a pleasant 67 y.o. 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   CT of the abdomen and pelvis performed on 12/22/2017 showed bulky left inguinal, left hemipelvic, and  retroperitoneal adenopathy.     01/02/18 Left inguinal LN Biopsy revealed T-Cell/histocyte rich Large B-Cell Lymphoma   12/27/17 ECHO revealed LV EF of 55-60%    01/05/18 PET/CT revealed Massively enlarged pelvic lymph nodes intense metabolic activity consistent lymphoma. 2. Additional hypermetabolic lymph nodes in the porta hepatis and retroperitoneum LEFT aorta. 3. Solitary hypermetabolic mediastinal lymph node in the upper LEFT Mediastinum. 4. Multiple discrete sites of hypermetabolic  skeletal metastasis (approximately 5 sites). 5. Normal spleen.     HIV non reactive on 12/22/2017.  Hep C and hep B serology negative.   03/14/18 PET/CT revealed PET-CT findings suggest an excellent response to chemotherapy. The abdominal lymphadenopathy has near completely resolved and demonstrates a near complete metabolic response. The pelvic and inguinal adenopathy has significantly decreased in size and the metabolic activity has significantly decreased. 2. Diffuse marrow activity likely due to chemotherapy and or marrow stimulating drugs. I do not see any discrete persistent lesions.    04/17/18 CT Head revealed Subtle mesial caudothalamic hypodensities may be artifact though, the could reflect encephalitis or Wernicke's encephalopathy. Consider MRI of the head with and without contrast. 2. Mild chronic small vessel ischemic changes    S/p 6 cycles of EPOCH-R completed on 06/01/18  06/28/18 PET/CT revealed Continued good response to treatment. No residual measurable or hypermetabolic abdominal lymphadenopathy and no recurrent osseous disease. 2. Interval decrease in size of the left operator region lymph node and also the left inguinal lymph node. However, the both have small foci of slightly increased hypermetabolism which bears surveillance. 3. No new or progressive lymphadenopathy in the neck, chest, abdomen or pelvis.  S/p 39.6 Gy in 22 fractions between 07/25/18 and 08/23/18  02/13/2019 CT C/A/P ([id number]) ([id number]) revealed "1. Response to therapy of pelvic adenopathy compared to the PET of 06/28/2018. 2. No new or progressive disease. 3. Mild prostatomegaly. 4. 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 5. Hepatomegaly."   2) left lower extremity swelling- now resolved  Doppler ultrasound for DVT was negative in the left lower extremity.   Likely from venous compression +/- lymphatic obstruction from bulky left inguinal, left hemipelvic,  and  retroperitoneal adenopathy.    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  PLAN: -Discussed lab results from today, 10/17/2022, in detail with patient. CBC results showed decreased hemoglobin at 10.2 and decreased hematocrit at 31.6%.  -CMP stable -Advised patient to continue limited sun exposure during  chemotherapy. -Patient tolerated cycle 3 of his  treatment well without any new or severe toxicities.  -Patient can proceed with cycle 4 of his treatment today without any dose modification.  -Schedule the PET scan after cycle 4, in 1-2 weeks.  -Discussed that we will drop chemotherapy and just continue with immunotherapy, Pembrolizumab, if PET scan does not show any abnormalities. And will consider ISRT to can residual lesions  . Orders Placed This Encounter  Procedures   NM PET Image Restag (PS) Skull Base To Thigh    Standing Status:   Future    Standing Expiration Date:   10/17/2023    Order Specific Question:   If indicated for the ordered procedure, I authorize the administration of a radiopharmaceutical per Radiology protocol    Answer:   Yes    Order Specific Question:   Preferred imaging location?    Answer:   [place]   CBC with Differential (Cancer Center Only)    Standing Status:   Future    Standing Expiration Date:   11/07/2023   CMP (Cancer Center only)    Standing Status:   Future    Standing Expiration Date:   11/07/2023   CBC with Differential (Cancer Center Only)    Standing Status:   Future    Standing Expiration Date:   11/28/2023   CMP (Cancer Center only)    Standing Status:   Future     Standing Expiration Date:   11/28/2023   CBC with Differential (Cancer Center Only)    Standing Status:   Future    Standing Expiration Date:   12/19/2023   CMP (Cancer Center only)    Standing Status:   Future    Standing Expiration Date:   12/19/2023   T4    Standing Status:   Future    Standing Expiration Date:   12/19/2023   TSH    Standing Status:   Future    Standing Expiration Date:   12/19/2023   CBC with Differential (Cancer Center Only)    Standing Status:   Future    Standing Expiration Date:   01/09/2024   CMP (Cancer Center only)    Standing Status:   Future    Standing Expiration Date:   01/09/2024   T4    Standing Status:   Future    Standing Expiration Date:   01/09/2024   TSH    Standing Status:   Future    Standing Expiration Date:   01/09/2024   CBC with Differential (Cancer Center Only)    Standing Status:   Future    Standing Expiration Date:   01/30/2024   CMP (Cancer Center only)    Standing Status:   Future    Standing Expiration Date:   01/30/2024   T4    Standing Status:   Future    Standing Expiration Date:   01/30/2024   TSH    Standing Status:   Future    Standing Expiration Date:   01/30/2024    FOLLOW-UP: -PET/CT in 7-10 days -Switching to Pembrolizumab alone from next treatment in 3 weeks -Portflush, labs and MD visit with next treatment in 3 weeks   The total time spent in the appointment was 32 minutes* .  All of the patient's questions were answered with apparent satisfaction. The patient knows to call the clinic with any problems, questions or concerns.   Wyvonnia Lora MD MS AAHIVMS [facility] Hematology/Oncology Physician [facility]  .*  Total Encounter Time as defined by the Centers for Medicare and Medicaid Services includes, in addition to the face-to-face time of a patient visit (documented in the note above) non-face-to-face time: obtaining and reviewing outside history, ordering and reviewing medications, tests or procedures, care  coordination (communications with other health care professionals or caregivers) and documentation in the medical record.   I,Param Shah,acting as a Neurosurgeon for Wyvonnia Lora, MD.,have documented all relevant documentation on the behalf of Wyvonnia Lora, MD,as directed by  Wyvonnia Lora, MD while in the presence of Wyvonnia Lora, MD.  .I have reviewed the above documentation for accuracy and completeness, and I agree with the above. Johney Maine MD

## 2022-10-17 NOTE — Progress Notes (Signed)
Patient seen by Dr. Kale  Vitals are within treatment parameters.  Labs reviewed: and are within treatment parameters.  Per physician team, patient is ready for treatment and there are NO modifications to the treatment plan.  

## 2022-10-18 ENCOUNTER — Other Ambulatory Visit: Payer: Self-pay

## 2022-10-21 ENCOUNTER — Inpatient Hospital Stay: Payer: Medicare HMO

## 2022-10-21 ENCOUNTER — Other Ambulatory Visit: Payer: Self-pay

## 2022-10-21 VITALS — BP 126/79 | HR 98 | Temp 98.9°F | Resp 18

## 2022-10-21 DIAGNOSIS — Z800 Family history of malignant neoplasm of digestive organs: Secondary | ICD-10-CM | POA: Diagnosis not present

## 2022-10-21 DIAGNOSIS — C320 Malignant neoplasm of glottis: Secondary | ICD-10-CM | POA: Diagnosis not present

## 2022-10-21 DIAGNOSIS — Z8572 Personal history of non-Hodgkin lymphomas: Secondary | ICD-10-CM | POA: Diagnosis not present

## 2022-10-21 DIAGNOSIS — Z803 Family history of malignant neoplasm of breast: Secondary | ICD-10-CM | POA: Diagnosis not present

## 2022-10-21 DIAGNOSIS — Z7189 Other specified counseling: Secondary | ICD-10-CM

## 2022-10-21 DIAGNOSIS — Z79899 Other long term (current) drug therapy: Secondary | ICD-10-CM | POA: Diagnosis not present

## 2022-10-21 DIAGNOSIS — C760 Malignant neoplasm of head, face and neck: Secondary | ICD-10-CM

## 2022-10-21 DIAGNOSIS — Z5112 Encounter for antineoplastic immunotherapy: Secondary | ICD-10-CM | POA: Diagnosis not present

## 2022-10-21 DIAGNOSIS — B20 Human immunodeficiency virus [HIV] disease: Secondary | ICD-10-CM | POA: Diagnosis not present

## 2022-10-21 DIAGNOSIS — Z7982 Long term (current) use of aspirin: Secondary | ICD-10-CM | POA: Diagnosis not present

## 2022-10-21 DIAGNOSIS — Z5111 Encounter for antineoplastic chemotherapy: Secondary | ICD-10-CM | POA: Diagnosis not present

## 2022-10-21 MED ORDER — HEPARIN SOD (PORK) LOCK FLUSH 100 UNIT/ML IV SOLN
500.0000 [IU] | Freq: Once | INTRAVENOUS | Status: AC | PRN
  Administered 2022-10-21: 500 [IU]

## 2022-10-21 MED ORDER — SODIUM CHLORIDE 0.9% FLUSH
10.0000 mL | INTRAVENOUS | Status: DC | PRN
  Administered 2022-10-21: 10 mL

## 2022-10-23 ENCOUNTER — Encounter: Payer: Self-pay | Admitting: Hematology

## 2022-10-24 ENCOUNTER — Ambulatory Visit (INDEPENDENT_AMBULATORY_CARE_PROVIDER_SITE_OTHER): Payer: Medicare HMO | Admitting: Podiatry

## 2022-10-24 DIAGNOSIS — B351 Tinea unguium: Secondary | ICD-10-CM | POA: Diagnosis not present

## 2022-10-24 DIAGNOSIS — M79676 Pain in unspecified toe(s): Secondary | ICD-10-CM

## 2022-10-24 DIAGNOSIS — L600 Ingrowing nail: Secondary | ICD-10-CM

## 2022-10-24 NOTE — Progress Notes (Unsigned)
       Subjective:  Patient ID: Jesus [name], male    DOB: June 30, 1955,  MRN: [id number]   Jesus [name] presents to clinic today for:  Chief Complaint  Patient presents with   Nail Problem     Patient states he's here for RFC  . Patient notes nails are thick, discolored, elongated and painful in shoegear when trying to ambulate.  Patient notes that he would like to have the ingrown toenails permanently removed in the future on his hallux toenails, but he needs to finish with his chemotherapy first as per his oncologist.  He was informed that it was not recommended to get a PNA until he is finished due to how this will affect his healing ability.  PCP is Noberto Retort, MD.  Allergies  Allergen Reactions   Bee Venom Anaphylaxis   Antifungal [Miconazole Nitrate] Other (See Comments)     made his head hurt   Zolpidem Tartrate Er Other (See Comments)    Leg cramps    Review of Systems: Negative except as noted in the HPI.  Objective:  There were no vitals filed for this visit.  Jesus [name] is a pleasant 67 y.o. male in NAD. AAO x 3.  Vascular Examination: Capillary refill time is 3-5 seconds to toes bilateral. Palpable pedal pulses b/l LE. Digital hair present b/l. No pedal edema b/l. Skin temperature gradient WNL b/l. No varicosities b/l. No cyanosis or clubbing noted b/l.   Dermatological Examination: Pedal skin with normal turgor, texture and tone b/l. No open wounds. No interdigital macerations b/l. Toenails x10 are 3mm thick, discolored, dystrophic with subungual debris. There is pain with compression of the nail plates.  They are elongated x10.  Hallux nails are incurvated along the medial lateral nail margins.  No signs of paronychia are noted.  No drainage is noted  Neurological Examination: Protective sensation intact bilateral LE.   Musculoskeletal Examination: Muscle strength 5/5 to all LE muscle groups b/l.   Assessment/Plan: 1. Pain due to onychomycosis  of toenail   2. Onychocryptosis     The mycotic toenails were sharply debrided x10 with sterile nail nippers and a power debriding burr to decrease bulk/thickness and length.  The hallux toenails were cut back along the medial and lateral nail margins uneventfully.  He noted immediate improvement following this.  Discussed the PNA procedure and what it entails and how the healing would be expected postoperatively with regard to timeframe.  Agreed that he should wait until his chemotherapy is finished so that he has the best healing potential.  Return in about 3 months (around 01/24/2023) for RFC.   Clerance Lav, DPM, FACFAS [facility]     [address] [place], [place] [address]                Office [phone]  Fax [phone]

## 2022-10-25 DIAGNOSIS — B351 Tinea unguium: Secondary | ICD-10-CM | POA: Insufficient documentation

## 2022-10-28 DIAGNOSIS — H52209 Unspecified astigmatism, unspecified eye: Secondary | ICD-10-CM | POA: Diagnosis not present

## 2022-10-28 DIAGNOSIS — H5213 Myopia, bilateral: Secondary | ICD-10-CM | POA: Diagnosis not present

## 2022-10-28 DIAGNOSIS — H524 Presbyopia: Secondary | ICD-10-CM | POA: Diagnosis not present

## 2022-11-07 ENCOUNTER — Ambulatory Visit (HOSPITAL_COMMUNITY)
Admission: RE | Admit: 2022-11-07 | Discharge: 2022-11-07 | Disposition: A | Discharge status: Home / Self Care | Payer: Medicare HMO | Source: Ambulatory Visit | Attending: Hematology | Admitting: Hematology

## 2022-11-07 DIAGNOSIS — C760 Malignant neoplasm of head, face and neck: Secondary | ICD-10-CM | POA: Diagnosis not present

## 2022-11-07 DIAGNOSIS — R590 Localized enlarged lymph nodes: Secondary | ICD-10-CM | POA: Diagnosis not present

## 2022-11-07 LAB — GLUCOSE, CAPILLARY: Glucose-Capillary: 151 mg/dL — ABNORMAL HIGH (ref 70–99)

## 2022-11-07 MED ORDER — FLUDEOXYGLUCOSE F - 18 (FDG) INJECTION
15.0000 | Freq: Once | INTRAVENOUS | Status: AC
  Administered 2022-11-07: 14.16 via INTRAVENOUS

## 2022-11-08 ENCOUNTER — Inpatient Hospital Stay: Payer: Medicare HMO

## 2022-11-08 ENCOUNTER — Inpatient Hospital Stay: Payer: Medicare HMO | Attending: Hematology

## 2022-11-08 ENCOUNTER — Telehealth: Payer: Self-pay | Admitting: Radiation Oncology

## 2022-11-08 ENCOUNTER — Other Ambulatory Visit: Payer: Self-pay

## 2022-11-08 ENCOUNTER — Inpatient Hospital Stay (HOSPITAL_BASED_OUTPATIENT_CLINIC_OR_DEPARTMENT_OTHER): Payer: Medicare HMO | Admitting: Hematology

## 2022-11-08 VITALS — BP 142/61 | HR 79

## 2022-11-08 DIAGNOSIS — Z87891 Personal history of nicotine dependence: Secondary | ICD-10-CM | POA: Diagnosis not present

## 2022-11-08 DIAGNOSIS — C320 Malignant neoplasm of glottis: Secondary | ICD-10-CM | POA: Insufficient documentation

## 2022-11-08 DIAGNOSIS — C760 Malignant neoplasm of head, face and neck: Secondary | ICD-10-CM

## 2022-11-08 DIAGNOSIS — Z79899 Other long term (current) drug therapy: Secondary | ICD-10-CM | POA: Insufficient documentation

## 2022-11-08 DIAGNOSIS — Z86718 Personal history of other venous thrombosis and embolism: Secondary | ICD-10-CM | POA: Diagnosis not present

## 2022-11-08 DIAGNOSIS — Z95828 Presence of other vascular implants and grafts: Secondary | ICD-10-CM

## 2022-11-08 DIAGNOSIS — Z7189 Other specified counseling: Secondary | ICD-10-CM

## 2022-11-08 DIAGNOSIS — Z5112 Encounter for antineoplastic immunotherapy: Secondary | ICD-10-CM | POA: Diagnosis not present

## 2022-11-08 LAB — CBC WITH DIFFERENTIAL (CANCER CENTER ONLY)
Abs Immature Granulocytes: 0.01 10*3/uL (ref 0.00–0.07)
Basophils Absolute: 0 10*3/uL (ref 0.0–0.1)
Basophils Relative: 0 %
Eosinophils Absolute: 0.1 10*3/uL (ref 0.0–0.5)
Eosinophils Relative: 1 %
HCT: 32.8 % — ABNORMAL LOW (ref 39.0–52.0)
Hemoglobin: 10.7 g/dL — ABNORMAL LOW (ref 13.0–17.0)
Immature Granulocytes: 0 %
Lymphocytes Relative: 45 %
Lymphs Abs: 2.3 10*3/uL (ref 0.7–4.0)
MCH: 31.2 pg (ref 26.0–34.0)
MCHC: 32.6 g/dL (ref 30.0–36.0)
MCV: 95.6 fL (ref 80.0–100.0)
Monocytes Absolute: 0.5 10*3/uL (ref 0.1–1.0)
Monocytes Relative: 11 %
Neutro Abs: 2.2 10*3/uL (ref 1.7–7.7)
Neutrophils Relative %: 43 %
Platelet Count: 331 10*3/uL (ref 150–400)
RBC: 3.43 MIL/uL — ABNORMAL LOW (ref 4.22–5.81)
RDW: 16 % — ABNORMAL HIGH (ref 11.5–15.5)
WBC Count: 5.1 10*3/uL (ref 4.0–10.5)
nRBC: 0 % (ref 0.0–0.2)

## 2022-11-08 LAB — CMP (CANCER CENTER ONLY)
ALT: 8 U/L (ref 0–44)
AST: 12 U/L — ABNORMAL LOW (ref 15–41)
Albumin: 4 g/dL (ref 3.5–5.0)
Alkaline Phosphatase: 71 U/L (ref 38–126)
Anion gap: 6 (ref 5–15)
BUN: 8 mg/dL (ref 8–23)
CO2: 28 mmol/L (ref 22–32)
Calcium: 9.7 mg/dL (ref 8.9–10.3)
Chloride: 105 mmol/L (ref 98–111)
Creatinine: 0.91 mg/dL (ref 0.61–1.24)
GFR, Estimated: >60 mL/min (ref >60)
Glucose, Bld: 169 mg/dL — ABNORMAL HIGH (ref 70–99)
Potassium: 4 mmol/L (ref 3.5–5.1)
Sodium: 139 mmol/L (ref 135–145)
Total Bilirubin: 0.4 mg/dL (ref 0.3–1.2)
Total Protein: 6.8 g/dL (ref 6.5–8.1)

## 2022-11-08 MED ORDER — SODIUM CHLORIDE 0.9% FLUSH
10.0000 mL | INTRAVENOUS | Status: DC | PRN
  Administered 2022-11-08: 10 mL

## 2022-11-08 MED ORDER — SODIUM CHLORIDE 0.9 % IV SOLN: Freq: Once | INTRAVENOUS | Status: AC

## 2022-11-08 MED ORDER — HEPARIN SOD (PORK) LOCK FLUSH 100 UNIT/ML IV SOLN
500.0000 [IU] | Freq: Once | INTRAVENOUS | Status: AC | PRN
  Administered 2022-11-08: 500 [IU]

## 2022-11-08 MED ORDER — SODIUM CHLORIDE 0.9% FLUSH
10.0000 mL | Freq: Once | INTRAVENOUS | Status: AC
  Administered 2022-11-08: 10 mL

## 2022-11-08 MED ORDER — SODIUM CHLORIDE 0.9 % IV SOLN
200.0000 mg | Freq: Once | INTRAVENOUS | Status: AC
  Administered 2022-11-08: 200 mg via INTRAVENOUS
  Filled 2022-11-08: qty 200

## 2022-11-08 NOTE — Progress Notes (Signed)
Patient seen by Dr. Kale  Vitals are within treatment parameters.  Labs reviewed: and are within treatment parameters.  Per physician team, patient is ready for treatment and there are NO modifications to the treatment plan.  

## 2022-11-08 NOTE — Telephone Encounter (Signed)
LVM 7/2@ 2:45pm to schedule RECON and SIM with Dr. Basilio Cairo

## 2022-11-08 NOTE — Progress Notes (Signed)
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  HISTORY OF PRESENTING ILLNESS:   Jesus [name] 67 y.o. male is here because of left lower extremity edema and lymphadenopathy.  The patient was seen in the emergency room this past Friday for the same issue.  A CT of the abdomen and pelvis was performed showing bulky left inguinal, left hemipelvic, and retroperitoneal adenopathy.  He was referred to [place] from the emergency room for further evaluation.  Doppler ultrasound of the left lower extremity was performed and was negative for DVT. Patient reports that he has been having left lower extremity edema in his left groin and left leg for approximately 1 month.  He states that the swelling in the left groin started to get better but then worsened.  The left lower extremity edema has slowly worsened over time.  Patient denies having fevers and chills.  He reports that he does have night sweats at times.  He reported having headaches approximate 1 [place]  He thinks his headaches are related to not having his blood pressure medication.  His headaches have now resolved.  He denies visual changes.  The patient denies chest pain, shortness of breath and cough.  No nausea, vomiting, constipation, diarrhea.  Denies abdominal pain.  The patient denies recent weight loss and has actually  gained weight recently.  Patient denies epistaxis, bleeding gums, hemoptysis, hematuria, but occasionally, and melena.  He reports increased urinary frequency over the past month but no dysuria.  The patient is here for evaluation and discussion of his recent CT and lab findings.  Interval History:  Jesus [name] 67 y.o. male who is here for continued evaluation and management of newly noted metastatic laryngeal squamous cell carcinoma. He is here for cycle 5 day 1 of Pembrolizumab.  Patient was last seen by me on 10/17/2022 and he complained of mild headache in the back of his head and right lower extremities pain due to ingrown toenail.  Patient reports he has been doing well overall without any new or severe medical concerns since our last visit. He notes that there was a mild knot near left upper neck, which disappeared after 3-4 days.   He notes his appetite has improved since our last visit. However, he does complain of swallowing problem when he eats anything.   He denies any new infection issues, fever, chills, night sweats, abdominal pain, chest pain, back pain, or leg swelling.   Patient has been tolerating his treatment well without any new or severe toxicities.    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- last radiation 06-2018   Cataract    removed both  eyes with l;ens implants    COPD (chronic obstructive pulmonary disease) (HCC)    Family history of colon cancer    in his brother- dx'd age 47    History of chemotherapy    last 06-01-2018   History of kidney stones    History of radiation therapy    last radiation 06-2018   Hyperlipidemia    currently under control   Hypertension    Irregular heart beats    Lymphadenopathy    Pain, lower leg    Bilateral    Peripheral arterial disease (HCC)    Pre-diabetes    Red-green color blindness    RLS (restless legs syndrome)    Snores    Wears glasses     SURGICAL HISTORY: Past Surgical History:  Procedure Laterality Date   CATARACT EXTRACTION W/ INTRAOCULAR LENS  IMPLANT, BILATERAL     COLONOSCOPY     DIRECT LARYNGOSCOPY Right 02/03/2022   Procedure: DIRECT LARYNGOSCOPY WITH BIOPSY OF RIGHT FALSE VOCAL CORD;  Surgeon: Osborn Coho, MD;  Location: [facility] OR;  Service: ENT;  Laterality: Right;   dislodged salava stone     FRACTURE SURGERY     HAND ARTHROPLASTY  1995   crushed left hand   INGUINAL LYMPH NODE BIOPSY Left 01/02/2018   Procedure: LEFT INGUINAL LYMPH NODE BIOPSY;  Surgeon: Emelia Loron, MD;  Location: [facility] OR;  Service: General;  Laterality: Left;   IR IMAGING GUIDED PORT INSERTION  01/15/2018   IR IMAGING GUIDED PORT INSERTION  08/10/2022   IR REMOVAL TUN ACCESS W/ PORT W/O FL MOD SED  03/11/2019   MEDIASTINOSCOPY N/A 07/22/2022   Procedure: MEDIASTINOSCOPY;  Surgeon: Loreli Slot, MD;  Location: [facility] OR;  Service: Thoracic;  Laterality: N/A;   MICROLARYNGOSCOPY Left 01/17/2014   Procedure: MICROLARYNGOSCOPY WITH EXCISION OF THE BIOPSY OF LEFT VOCAL CORD LESION;  Surgeon: Serena Colonel, MD;  Location:  [facility];  Service: ENT;  Laterality: Left;   MICROLARYNGOSCOPY N/A 09/16/2020   Procedure: MICROLARYNGOSCOPY with Biopsy of vocal cord lesion;  Surgeon: Osborn Coho, MD;  [facility] OR;  Service: ENT;  Laterality: N/A;   ORIF FOOT FRACTURE  2005   left   REFRACTIVE SURGERY Right    removed cloudiness in right eye after cataract removal     SOCIAL HISTORY: Social History   Socioeconomic History   Marital status: Divorced    Spouse name: Not on file   Number of children: 3   Years of education: Not on file   Highest education level: Not on file  Occupational History   Not on file  Tobacco Use   Smoking status: Former    Packs/day: 0.50    Years:  36.00    Additional pack years: 0.00    Total pack years: 18.00    Types: Cigarettes    Quit date: 09/28/2020    Years since quitting: 2.1   Smokeless tobacco: Never   Tobacco comments:    he denies smoking in about 2 weeks 07/13/18  Vaping Use   Vaping Use: Never used  Substance and Sexual Activity   Alcohol use: Yes    Alcohol/week: 6.0 standard drinks of alcohol    Types: 6 Cans of beer per week    Comment: weekends   Drug use: Yes    Types: Marijuana, Cocaine    Comment: reports cocaine usage ~2X/ month; last use 07/2022   Sexual activity: Not on file  Other Topics Concern   Not on file  Social History Narrative   Not on file   Social Determinants of Health   Financial Resource Strain: Low Risk  (10/06/2020)   Overall Financial Resource Strain (CARDIA)    Difficulty of Paying Living Expenses: Not very hard  Food Insecurity: No Food Insecurity (10/06/2020)   Hunger Vital Sign    Worried About Running Out of Food in the Last Year: Never true    Ran Out of Food in the Last Year: Never true  Transportation Needs: No Transportation Needs (10/06/2020)   PRAPARE - Administrator, Civil Service (Medical): No    Lack of Transportation (Non-Medical): No  Physical Activity: Not on file  Stress: Not on file  Social Connections: Moderately Integrated (10/06/2020)   Social Connection and Isolation Panel [NHANES]    Frequency of Communication with Friends and Family: Three times a week    Frequency of Social Gatherings with Friends and Family: Three times a week    Attends Religious Services: 1 to 4 times per year    Active Member of Clubs or Organizations: Yes    Attends Banker Meetings: 1 to 4 times per year    Marital Status: Divorced  Intimate Partner Violence: Not At Risk (07/13/2018)   Humiliation, Afraid, Rape, and Kick questionnaire    Fear of Current or Ex-Partner: No    Emotionally Abused: No    Physically Abused: No    Sexually Abused: No     FAMILY HISTORY: Family History  Problem Relation Age of Onset   Breast cancer Mother    Diabetes Father    Hypertension Father    Stroke Father    Mental illness Sister    Hypertension Daughter    Mental illness Daughter    Hypertension Brother    Colon cancer Brother 71       passed away 12-03-2018   Breast cancer Sister    Esophageal cancer Neg Hx    Colon polyps Neg Hx    Rectal cancer Neg Hx    Stomach cancer Neg Hx     ALLERGIES:  is allergic to bee venom, antifungal [miconazole nitrate], and zolpidem tartrate er.  MEDICATIONS:  Current Outpatient Medications  Medication Sig Dispense Refill   acetaminophen (TYLENOL) 500 MG tablet Take 1,500 mg by mouth daily as needed for moderate pain.     amLODipine (NORVASC) 10 MG tablet TAKE 1 TABLET BY MOUTH EVERY DAY (Patient taking differently: Take 10 mg by mouth every evening.) 30 tablet 0   aspirin EC 81 MG tablet Take 81 mg by mouth every evening. Swallow whole.     b complex vitamins capsule Take 1 capsule by mouth daily.     dexamethasone (DECADRON) 4 MG tablet Take 2 tablets (8mg ) by mouth daily starting the day after carboplatin for 3 days. Take with food 30 tablet 1   lidocaine-prilocaine (EMLA) cream Apply to affected area once 30 g 3   lisinopril (ZESTRIL) 20 MG tablet TAKE 1 TABLET BY MOUTH EVERY DAY (Patient taking differently: Take 20 mg by mouth every evening.) 30 tablet 0   metFORMIN (GLUCOPHAGE) 500 MG tablet Take 500 mg by mouth 2 (two) times daily with a meal.     Multiple Vitamins-Minerals (MULTI ADULT GUMMIES PO) Take 1 tablet by mouth in the morning. Centrum     ondansetron (ZOFRAN) 8 MG tablet Take 1 tablet (8 mg total) by mouth every 8 (eight) hours as needed for nausea or vomiting. Start on the third day after  carboplatin. 30 tablet 1   oxyCODONE (OXY IR/ROXICODONE) 5 MG immediate release tablet Take 1 tablet (5 mg total) by mouth every 6 (six) hours as needed for moderate pain or breakthrough pain. 20 tablet  0   prochlorperazine (COMPAZINE) 10 MG tablet Take 1 tablet (10 mg total) by mouth every 6 (six) hours as needed for nausea or vomiting. 30 tablet 1   tadalafil (CIALIS) 20 MG tablet Take 20 mg by mouth daily as needed for erectile dysfunction.     No current facility-administered medications for this visit.    REVIEW OF SYSTEMS:   10 Point review of Systems was done is negative except as noted above.  PHYSICAL EXAMINATION: .BP (!) 149/85   Pulse 90   Temp 98.2 F (36.8 C)   Resp 20   Wt 292 lb 12.8 oz (132.8 kg)   SpO2 98%   BMI 39.71 kg/m  . GENERAL:alert, in no acute distress and comfortable SKIN: no acute rashes, no significant lesions EYES: conjunctiva are pink and non-injected, sclera anicteric OROPHARYNX: MMM, no exudates, no oropharyngeal erythema or ulceration NECK: supple, no JVD LYMPH:  no palpable lymphadenopathy in the cervical, axillary or inguinal regions LUNGS: clear to auscultation b/l with normal respiratory effort HEART: regular rate & rhythm ABDOMEN:  normoactive bowel sounds , non tender, not distended. Extremity: no pedal edema PSYCH: alert & oriented x 3 with fluent speech NEURO: no focal motor/sensory deficits   LABORATORY DATA:   I have reviewed the data as listed    Latest Ref Rng & Units 11/08/2022   11:53 AM 10/17/2022   11:04 AM 09/26/2022    1:40 PM  CBC  WBC 4.0 - 10.5 K/uL 5.1  4.8  4.8   Hemoglobin 13.0 - 17.0 g/dL 16.1  09.6  04.5   Hematocrit 39.0 - 52.0 % 32.8  31.6  31.9   Platelets 150 - 400 K/uL 331  346  455    CBC    Component Value Date/Time   WBC 5.1 11/08/2022 1153   WBC 6.5 07/19/2022 1400   RBC 3.43 (L) 11/08/2022 1153   HGB 10.7 (L) 11/08/2022 1153   HCT 32.8 (L) 11/08/2022 1153   PLT 331 11/08/2022 1153   MCV 95.6 11/08/2022 1153   MCV 87.3 12/22/2017 1518   MCH 31.2 11/08/2022 1153   MCHC 32.6 11/08/2022 1153   RDW 16.0 (H) 11/08/2022 1153   LYMPHSABS 2.3 11/08/2022 1153   MONOABS 0.5 11/08/2022 1153    EOSABS 0.1 11/08/2022 1153   BASOSABS 0.0 11/08/2022 1153       Latest Ref Rng & Units 11/08/2022   11:53 AM 10/17/2022   11:04 AM 09/26/2022    1:40 PM  CMP  Glucose 70 - 99 mg/dL 409  811  914   BUN 8 - 23 mg/dL 8  12  10    Creatinine 0.61 - 1.24 mg/dL 7.82  9.56  2.13   Sodium 135 - 145 mmol/L 139  139  138   Potassium 3.5 - 5.1 mmol/L 4.0  4.1  4.2   Chloride 98 - 111 mmol/L 105  105  104   CO2 22 - 32 mmol/L 28  28  27    Calcium 8.9 - 10.3 mg/dL 9.7  9.2  9.5   Total Protein 6.5 - 8.1 g/dL 6.8  7.4  7.1   Total Bilirubin 0.3 - 1.2 mg/dL 0.4  0.5  0.4   Alkaline Phos 38 - 126 U/L 71  64  70  AST 15 - 41 U/L 12  12  12    ALT 0 - 44 U/L 8  9  12     . Lab Results  Component Value Date   LDH 168 05/18/2022   Tsh 1.42 (05/21/2021)   01/02/18 Left Inguinal LN Bx:   12/26/17 Tissue Flow Cytometry:   12/26/17 Inguinal Core biopsy:      RADIOGRAPHIC STUDIES: I have personally reviewed the radiological images as listed and agreed with the findings in the report. NM PET Image Restag (PS) Skull Base To Thigh  Result Date: 11/08/2022 CLINICAL DATA:  Subsequent treatment strategy for metastatic laryngeal carcinoma. Palliative radiation therapy planning. EXAM: NUCLEAR MEDICINE PET SKULL BASE TO THIGH TECHNIQUE: 14.16 mCi F-18 FDG was injected intravenously. Full-ring PET imaging was performed from the skull base to thigh after the radiotracer. CT data was obtained and used for attenuation correction and anatomic localization. Fasting blood glucose: 151 mg/dl COMPARISON:  PET-CT 16/02/9603.  CT neck 05/05/2022. FINDINGS: Mediastinal blood pool activity: SUV max 3.2 NECK: Progressive enlargement and hypermetabolic activity within the previously demonstrated enlarged level II lymph node on the right.Currently, this measures approximately 3.3 x 2.5 cm and has an SUV max of 11.1 (previously 2.3 x 1.9 cm, SUV max 4.3). No other enlarged or hypermetabolic lymph nodes are identified in the neck.  No suspicious activity identified within the pharyngeal mucosal space. Incidental CT findings: none CHEST: Progressive enlargement and increased hypermetabolic activity within the previously demonstrated left paratracheal node at the level of the thoracic inlet. This now measures 3.8 x 2.2 cm on image 54/4 and has an SUV max of 10.6 (previously 3.4 x 2.0 cm, SUV max 4.0). No other hypermetabolic mediastinal, hilar or axillary lymph nodes are identified. No hypermetabolic pulmonary activity or suspicious nodularity. Incidental CT findings: Right IJ Port-A-Cath extends to the superior cavoatrial junction. Mild cardiomegaly. The lungs are clear. ABDOMEN/PELVIS: There is no hypermetabolic activity within the liver, adrenal glands, spleen or pancreas. There is no hypermetabolic nodal activity in the abdomen or pelvis. Prominent and a rectal activity, likely physiologic. The bowel activity is otherwise within physiologic limits. Incidental CT findings: Stable bilateral renal cysts without hypermetabolic activity for which no follow-up imaging is recommended. Mild aortoiliac atherosclerosis. SKELETON: There is no hypermetabolic activity to suggest osseous metastatic disease. Incidental CT findings: Stable trabecular thickening in the iliac bones bilaterally. Stable lumbar spondylosis and osteitis pubis. IMPRESSION: 1. Progressive enlargement and hypermetabolic activity within the previously demonstrated right level II cervical and left paratracheal lymph nodes consistent with progressive metastatic disease. 2. No evidence of metastatic disease in the abdomen or pelvis. 3. No evidence of osseous metastatic disease. 4.  Aortic Atherosclerosis (ICD10-I70.0). Electronically Signed   By: Carey Bullocks M.D.   On: 11/08/2022 13:32    ASSESSMENT & PLAN:   This is a pleasant 67 y.o. 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   CT of the abdomen and pelvis performed on 12/22/2017 showed bulky left inguinal, left hemipelvic, and  retroperitoneal adenopathy.     01/02/18 Left inguinal LN Biopsy revealed T-Cell/histocyte rich Large B-Cell Lymphoma   12/27/17 ECHO revealed LV EF of 55-60%    01/05/18 PET/CT revealed Massively enlarged pelvic lymph nodes intense metabolic activity consistent lymphoma. 2. Additional hypermetabolic lymph nodes in the porta hepatis and  retroperitoneum LEFT aorta. 3. Solitary hypermetabolic mediastinal lymph node in the upper LEFT Mediastinum. 4. 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 5. Normal spleen.     HIV non reactive on 12/22/2017.  Hep C and hep B serology negative.   03/14/18 PET/CT revealed PET-CT findings suggest an excellent response to chemotherapy. The abdominal lymphadenopathy has near completely resolved and demonstrates a near complete metabolic response. The pelvic and inguinal adenopathy has significantly decreased in size and the metabolic activity has significantly decreased. 2. Diffuse marrow activity likely due to chemotherapy and or marrow stimulating drugs. I do not see any discrete persistent lesions.    04/17/18 CT Head revealed Subtle mesial caudothalamic hypodensities may be artifact though, the could reflect encephalitis or Wernicke's encephalopathy. Consider MRI of the head with and without contrast. 2. Mild chronic small vessel ischemic changes    S/p 6 cycles of EPOCH-R completed on 06/01/18  06/28/18 PET/CT revealed Continued good response to treatment. No residual measurable or hypermetabolic abdominal lymphadenopathy and no recurrent osseous disease. 2. Interval decrease in size of the left operator region lymph node and also the left inguinal lymph node.  However, the both have small foci of slightly increased hypermetabolism which bears surveillance. 3. No new or progressive lymphadenopathy in the neck, chest, abdomen or pelvis.  S/p 39.6 Gy in 22 fractions between 07/25/18 and 08/23/18  02/13/2019 CT C/A/P ([id number]) ([id number]) revealed "1. Response to therapy of pelvic adenopathy compared to the PET of 06/28/2018. 2. No new or progressive disease. 3. Mild prostatomegaly. 4. 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 5. Hepatomegaly."   2) left lower extremity swelling- now resolved  Doppler ultrasound for DVT was negative in the left lower extremity.   Likely from venous compression +/- lymphatic obstruction from bulky left inguinal, left hemipelvic, and  retroperitoneal adenopathy.    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  PLAN: -Discussed lab results from today, 11/08/2022, with the patient. CBC shows slightly decreased hemoglobin at 10.7 g/dL and slightly decreased hematocrit at 32.8%.  -CMP is stable. -Discussed PET scan results from 11/07/2022 with the patient. Still waiting on official reading.  -Discussed that patient will need to go to radiation oncologist to consider palliative ISRT to the areas of residual disease as seen on the PET scan.  -Patient has been tolerating his treatment well without any new or severe toxicities.  -Patient will only get the immunotherapy (Pembrolizumab) from this cycle. Discontinue 5FU and carboplatin.  FOLLOW-UP: -Referral to radiation oncology for consideration of palliative  RT -Continue maintenance Pembrolizumab every 3 weeks with labs and MD/APP visit per integrated scheduling   The total time spent in the appointment was 30 minutes* .  All of the patient's questions were answered with apparent satisfaction. The patient knows to call the clinic with any problems, questions or concerns.   Wyvonnia Lora MD MS AAHIVMS [facility] Hematology/Oncology Physician [facility]  .*Total Encounter Time as defined by the Centers for Medicare and Medicaid Services includes, in addition to the face-to-face time of a patient visit (documented in the note above) non-face-to-face time: obtaining and reviewing outside history, ordering  and reviewing medications, tests or procedures, care coordination (communications with other health care professionals or caregivers) and documentation in the medical record.   I, Ok Edwards, am acting as a Neurosurgeon for Wyvonnia Lora, MD. .I have reviewed the above documentation for accuracy and completeness, and I agree with the above. Johney Maine MD

## 2022-11-08 NOTE — Patient Instructions (Signed)
[facility]  Discharge Instructions: Thank you for choosing [facility] to provide your oncology and hematology care.   If you have a lab appointment with the Cancer Center, please go directly to the Cancer Center and check in at the registration area.   Wear comfortable clothing and clothing appropriate for easy access to any Portacath or PICC line.   We strive to give you quality time with your provider. You may need to reschedule your appointment if you arrive late (15 or more minutes).  Arriving late affects you and other patients whose appointments are after yours.  Also, if you miss three or more appointments without notifying the office, you may be dismissed from the clinic at the provider's discretion.      For prescription refill requests, have your pharmacy contact our office and allow 72 hours for refills to be completed.    Today you received the following chemotherapy and/or immunotherapy agents: Keytruda      To help prevent nausea and vomiting after your treatment, we encourage you to take your nausea medication as directed.  BELOW ARE SYMPTOMS THAT SHOULD BE REPORTED IMMEDIATELY: *FEVER GREATER THAN 100.4 F (38 C) OR HIGHER *CHILLS OR SWEATING *NAUSEA AND VOMITING THAT IS NOT CONTROLLED WITH YOUR NAUSEA MEDICATION *UNUSUAL SHORTNESS OF BREATH *UNUSUAL BRUISING OR BLEEDING *URINARY PROBLEMS (pain or burning when urinating, or frequent urination) *BOWEL PROBLEMS (unusual diarrhea, constipation, pain near the anus) TENDERNESS IN MOUTH AND THROAT WITH OR WITHOUT PRESENCE OF ULCERS (sore throat, sores in mouth, or a toothache) UNUSUAL RASH, SWELLING OR PAIN  UNUSUAL VAGINAL DISCHARGE OR ITCHING   Items with * indicate a potential emergency and should be followed up as soon as possible or go to the Emergency Department if any problems should occur.  Please show the CHEMOTHERAPY ALERT CARD or IMMUNOTHERAPY ALERT CARD at  check-in to the Emergency Department and triage nurse.  Should you have questions after your visit or need to cancel or reschedule your appointment, please contact [facility]  Dept: [phone]  and follow the prompts.  Office hours are 8:00 a.m. to 4:30 p.m. Monday - Friday. Please note that voicemails left after 4:00 p.m. may not be returned until the following business day.  We are closed weekends and major holidays. You have access to a nurse at all times for urgent questions. Please call the main number to the clinic Dept: [phone] and follow the prompts.   For any non-urgent questions, you may also contact your provider using MyChart. We now offer e-Visits for anyone 18 and older to request care online for non-urgent symptoms. For details visit [email or web address].   Also download the MyChart app! Go to the app store, search "MyChart", open the app, select [facility], and log in with your MyChart username and password.   

## 2022-11-09 ENCOUNTER — Encounter: Payer: Self-pay | Admitting: Radiation Oncology

## 2022-11-09 ENCOUNTER — Telehealth: Payer: Self-pay

## 2022-11-09 NOTE — Progress Notes (Signed)
Radiation Oncology         [phone] ________________________________  Outpatient Re-Consultation  Name: Jesus [name] MRN: [id number]  Date: 11/11/2022  DOB: 14-Mar-1956  WU:[id number], Tawni Pummel, MD  Johney Maine, MD   REFERRING PHYSICIAN: Johney Maine, MD  DIAGNOSIS:    ICD-10-CM   1. 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    Cancer Staging  Diffuse large B-cell lymphoma of lymph nodes of multiple regions [facility]) Staging form: Hodgkin and Non-Hodgkin Lymphoma, AJCC 8th Edition - Clinical: Stage IV - Signed by Lonie Peak, MD on 07/13/2018  Malignant neoplasm of glottis [facility]) Staging form: Larynx - Glottis, AJCC 8th Edition - Clinical stage from 10/02/2020: Stage I (cT1a, cN0, cM0) - Signed by Lonie Peak, MD on 10/02/2020  CHIEF COMPLAINT: Here to discuss management of nodal metastases from a laryngeal cancer primary  HISTORY OF PRESENT ILLNESS::Jesus [name] is a 67 y.o. male who presents today to discuss the role of radiation therapy in management of his recently diagnosed cervical lymph node metastases from a laryngeal cancer primary. The patient was last seen for follow-up on 06/01/22. To review from his last visit, the patient had recently undergone a right neck lymph node biopsy on 05/25/22 which showed findings consistent with metastatic keratinizing squamous cell carcinoma. Biopsy was indicated by new right neck and mediastinal adenopathy which he first noticed in December 2023. (He soon after presented for an MRI of the brain on 06/02/22 which showed no intracranial abnormalities).  Based on biopsy results, the  patient followed up with Dr. Candise Che who recommended proceeding with a PET scan. Subsequent PET scan on 06/08/22 demonstrated: moderate hypermetabolism associated with the right cervical and mediastinal metastatic lymphadenopathy; nonspecific mildly prominent activity within the nasopharynx and left pharyngeal tonsil (potentially reactive in etiology); and resolution of the previously demonstrated left pelvic and inguinal adenopathy. PET otherwise showed no evidence of distant metastatic disease and no findings suspicious for recurrent lymphoma.   He was accordingly referred back to Dr. Ernestene Kiel at [facility] on 07/05/22 to discuss these findings. During this visit, the patient endorsed increasing hoarseness over the past couple of weeks, and intermittent bilateral ear pain . He denied any swallowing issues (dysphagia or odynophagia), but reported feeling like there was a lump in his throat.   Given his overall complex history including laryngeal cancer and lymphoma, Dr. Ernestene Kiel recommended proceeding with tumor board evaluation to determine an optimal treatment course.   He was subsequently referred to Dr. Dorris Fetch on 07/14/22 who recommended proceeding with a mediastinoscopy for additional nodal biopsies/excisions of the left paratracheal adenopathy to confirm his diagnosis. Biopsies of the left paratracheal mass collected on 07/22/22 showed fibrous tissue involved by squamous cell carcinoma; nodal status of 3/3 lymph nodes negative for metastatic carcinoma.   Dr. Dorris Fetch did not recommend resection given that it was not possible to remove the visible disease.   Dr. Candise Che subsequently recommended proceeding with systemic treatment consisting of carbo/5FU/Pembrolizumab based on PdL1 status, followed by palliative radiation therapy to any residual disease s/p systemic treatment. He received his first dose on 08/15/22. Chemo toxicities reported by the  patient throughout the course of systemic treatment included  mild fatigue, nausea (improved with steroids) occasional bilateral hand and leg neuropathy due to Pembrolizumab, and swallowing problems.   He received his 4th a final cycle of chemotherapy on 11/08/22. He will now continue with immunotherapy alone.   Pertinent imaging thus far includes a restaging PET scan performed on 11/07/22 which demonstrated progressive enlargement and hypermetabolic activity within the previously demonstrated right level II cervical and left paratracheal lymph nodes consistent with progressive metastatic disease. PET otherwise showed no evidence of metastatic disease in the abdomen or pelvis, and no evidence of osseous metastatic disease.   Swallowing issues, if any: denies dysphagia or odynophagia but has reported feeling a lump-like feeling in his throat. Dr. Candise Che did note that he had some swallowing issues with systemic treatment.   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   PREVIOUS RADIATION THERAPY: Yes   2) Diagnoses: C32.0-Malignant neoplasm of glottis  Intent: Curative  Radiation Treatment Dates: 10/15/2020 through 11/25/2020 Site Technique Total Dose (Gy) Dose per Fx (Gy) Completed Fx Beam Energies  Larynx: HN_larynx 3D 63/63 2.25 28/28 6X   1) Diagnoses: C83.38-Diffuse large b-cell lymphoma, lymph nodes of multiple sites Intent: Curative Radiation  Treatment Dates: 07/25/2018 through 08/23/2018 Site Technique Total Dose Dose per Fx Completed Fx Beam Energies  Pelvis: Pelvis IMRT 39.6/39.6 1.8 22/22 6X    PAST MEDICAL HISTORY:  has a past medical history of Allergy, Anemia, Arthritis, Blood transfusion without reported diagnosis, Cancer (HCC), Cataract, COPD (chronic obstructive pulmonary disease) (HCC), Family history of colon cancer, History of chemotherapy, History of kidney stones, History of radiation therapy, Hyperlipidemia, Hypertension, Irregular heart beats, Lymphadenopathy, Pain, lower leg, Peripheral arterial disease (HCC), Pre-diabetes, Red-green color blindness, RLS (restless legs syndrome), Snores, and Wears glasses.    PAST SURGICAL HISTORY: Past Surgical History:  Procedure Laterality Date   CATARACT EXTRACTION W/ INTRAOCULAR LENS  IMPLANT, BILATERAL     COLONOSCOPY     DIRECT LARYNGOSCOPY Right 02/03/2022   Procedure: DIRECT LARYNGOSCOPY WITH BIOPSY OF RIGHT FALSE VOCAL CORD;  Surgeon: Osborn Coho, MD;  Location[facility] OR;  Service: ENT;  Laterality: Right;   dislodged salava stone     FRACTURE SURGERY     HAND ARTHROPLASTY  1995   crushed left hand   INGUINAL LYMPH NODE BIOPSY Left 01/02/2018   Procedure: LEFT INGUINAL LYMPH NODE BIOPSY;  Surgeon: Emelia Loron, MD;  Location: [facility] OR;  Service: General;  Laterality: Left;   IR IMAGING GUIDED PORT INSERTION  01/15/2018   IR IMAGING GUIDED PORT INSERTION  08/10/2022   IR REMOVAL TUN ACCESS W/ PORT W/O FL MOD SED  03/11/2019   MEDIASTINOSCOPY N/A 07/22/2022   Procedure: MEDIASTINOSCOPY;  Surgeon: Loreli Slot, MD;  Location: [facility] OR;  Service: Thoracic;  Laterality: N/A;   MICROLARYNGOSCOPY Left 01/17/2014   Procedure: MICROLARYNGOSCOPY WITH EXCISION OF THE BIOPSY OF LEFT VOCAL CORD LESION;  Surgeon: Enrigue Catena  Pollyann Kennedy, MD;  Location: [facility];  Service: ENT;  Laterality: Left;   MICROLARYNGOSCOPY N/A 09/16/2020   Procedure: MICROLARYNGOSCOPY with Biopsy of  vocal cord lesion;  Surgeon: Osborn Coho, MD;  Location: [facility] OR;  Service: ENT;  Laterality: N/A;   ORIF FOOT FRACTURE  2005   left   REFRACTIVE SURGERY Right    removed cloudiness in right eye after cataract removal     FAMILY HISTORY: family history includes Breast cancer in his mother and sister; Colon cancer (age of onset: 76) in his brother; Diabetes in his father; Hypertension in his brother, daughter, and father; Mental illness in his daughter and sister; Stroke in his father.  SOCIAL HISTORY:  reports that he quit smoking about 2 years ago. His smoking use included cigarettes. He has a 18.00 pack-year smoking history. He has never used smokeless tobacco. He reports current alcohol use of about 6.0 standard drinks of alcohol per week. He reports current drug use. Drugs: , Marijuana, and Cocaine.  ALLERGIES: Bee venom, Antifungal [miconazole nitrate], and Zolpidem tartrate er  MEDICATIONS:  Current Outpatient Medications  Medication Sig Dispense Refill   acetaminophen (TYLENOL) 500 MG tablet Take 1,500 mg by mouth daily as needed for moderate pain.     amLODipine (NORVASC) 10 MG tablet TAKE 1 TABLET BY MOUTH EVERY DAY (Patient taking differently: Take 10 mg by mouth every evening.) 30 tablet 0   aspirin EC 81 MG tablet Take 81 mg by mouth every evening. Swallow whole.     b complex vitamins capsule Take 1 capsule by mouth daily.     lidocaine-prilocaine (EMLA) cream Apply to affected area once 30 g 3   lisinopril (ZESTRIL) 20 MG tablet TAKE 1 TABLET BY MOUTH EVERY DAY (Patient taking differently: Take 20 mg by mouth every evening.) 30 tablet 0   metFORMIN (GLUCOPHAGE) 500 MG tablet Take 500 mg by mouth 2 (two) times daily with a meal.     Multiple Vitamins-Minerals (MULTI ADULT GUMMIES PO) Take 1 tablet by mouth in the morning. Centrum     ondansetron (ZOFRAN) 8 MG tablet Take 1 tablet (8 mg total) by mouth every 8 (eight) hours as needed for nausea or vomiting. Start on the third  day after carboplatin. 30 tablet 1   oxyCODONE (OXY IR/ROXICODONE) 5 MG immediate release tablet Take 1 tablet (5 mg total) by mouth every 6 (six) hours as needed for moderate pain or breakthrough pain. 20 tablet 0   prochlorperazine (COMPAZINE) 10 MG tablet Take 1 tablet (10 mg total) by mouth every 6 (six) hours as needed for nausea or vomiting. 30 tablet 1   tadalafil (CIALIS) 20 MG tablet Take 20 mg by mouth daily as needed for erectile dysfunction.     dexamethasone (DECADRON) 4 MG tablet Take 2 tablets (8mg ) by mouth daily starting the day after carboplatin for 3 days. Take with food (Patient not taking: Reported on 11/08/2022) 30 tablet 1   No current facility-administered medications for this encounter.    REVIEW OF SYSTEMS:  Notable for that above.   PHYSICAL EXAM:  height is 6' (1.829 m) and weight is 290 lb 8 oz (131.8 kg). His temporal temperature is 98 F (36.7 C). His blood pressure is 111/74 and his pulse is 94. His respiration is 18 and oxygen saturation is 99%.   General: Alert and oriented, in no acute distress HEENT: Head is normocephalic. Extraocular movements are intact. Oropharynx is notable for strong tongue, cannot see obvious lesions in  throat or mouth. Neck: Neck is notable for fixated right cervical mass, at least 3cm, no other masses Heart: Regular in rate and rhythm with no murmurs, rubs, or gallops. Chest: Clear to auscultation bilaterally, with no rhonchi, wheezes, or rales. Abdomen: Soft, nontender, nondistended, with no rigidity or guarding. Lymphatics: see Neck Exam Skin: No concerning lesions. Musculoskeletal: symmetric strength and muscle tone throughout. Neurologic: Cranial nerves II through XII are grossly intact. No obvious focalities. Speech is fluent. Coordination is intact. Psychiatric: Judgment and insight are intact. Affect is appropriate.   ECOG = 1  0 - Asymptomatic (Fully active, able to carry on all predisease activities without  restriction)  1 - Symptomatic but completely ambulatory (Restricted in physically strenuous activity but ambulatory and able to carry out work of a light or sedentary nature. For example, light housework, office work)  2 - Symptomatic, <50% in bed during the day (Ambulatory and capable of all self care but unable to carry out any work activities. Up and about more than 50% of waking hours)  3 - Symptomatic, >50% in bed, but not bedbound (Capable of only limited self-care, confined to bed or chair 50% or more of waking hours)  4 - Bedbound (Completely disabled. Cannot carry on any self-care. Totally confined to bed or chair)  5 - Death   Santiago Glad MM, Creech RH, Tormey DC, et al. [phone]). "Toxicity and response criteria of the [facility]". Am. Evlyn Clines. Oncol. 5 (6): 649-55   LABORATORY DATA:  Lab Results  Component Value Date   WBC 5.1 11/08/2022   HGB 10.7 (L) 11/08/2022   HCT 32.8 (L) 11/08/2022   MCV 95.6 11/08/2022   PLT 331 11/08/2022   CMP     Component Value Date/Time   NA 139 11/08/2022 1153   NA 143 08/21/2019 1459   K 4.0 11/08/2022 1153   CL 105 11/08/2022 1153   CO2 28 11/08/2022 1153   GLUCOSE 169 (H) 11/08/2022 1153   BUN 8 11/08/2022 1153   BUN 17 08/21/2019 1459   CREATININE 0.91 11/08/2022 1153   CREATININE 1.09 04/29/2014 1930   CALCIUM 9.7 11/08/2022 1153   PROT 6.8 11/08/2022 1153   PROT 7.0 08/21/2019 1459   ALBUMIN 4.0 11/08/2022 1153   ALBUMIN 4.7 08/21/2019 1459   AST 12 (L) 11/08/2022 1153   ALT 8 11/08/2022 1153   ALKPHOS 71 11/08/2022 1153   BILITOT 0.4 11/08/2022 1153   GFRNONAA >60 11/08/2022 1153   GFRNONAA 74 04/29/2014 1930   GFRAA >60 11/28/2019 1329   GFRAA 86 04/29/2014 1930      Lab Results  Component Value Date   TSH 2.146 09/26/2022     RADIOGRAPHY: NM PET Image Restag (PS) Skull Base To Thigh  Result Date: 11/08/2022 CLINICAL DATA:  Subsequent treatment strategy for metastatic laryngeal carcinoma.  Palliative radiation therapy planning. EXAM: NUCLEAR MEDICINE PET SKULL BASE TO THIGH TECHNIQUE: 14.16 mCi F-18 FDG was injected intravenously. Full-ring PET imaging was performed from the skull base to thigh after the radiotracer. CT data was obtained and used for attenuation correction and anatomic localization. Fasting blood glucose: 151 mg/dl COMPARISON:  PET-CT 96/08/5407.  CT neck 05/05/2022. FINDINGS: Mediastinal blood pool activity: SUV max 3.2 NECK: Progressive enlargement and hypermetabolic activity within the previously demonstrated enlarged level II lymph node on the right.Currently, this measures approximately 3.3 x 2.5 cm and has an SUV max of 11.1 (previously 2.3 x 1.9 cm, SUV max 4.3). No other enlarged or hypermetabolic lymph  nodes are identified in the neck. No suspicious activity identified within the pharyngeal mucosal space. Incidental CT findings: none CHEST: Progressive enlargement and increased hypermetabolic activity within the previously demonstrated left paratracheal node at the level of the thoracic inlet. This now measures 3.8 x 2.2 cm on image 54/4 and has an SUV max of 10.6 (previously 3.4 x 2.0 cm, SUV max 4.0). No other hypermetabolic mediastinal, hilar or axillary lymph nodes are identified. No hypermetabolic pulmonary activity or suspicious nodularity. Incidental CT findings: Right IJ Port-A-Cath extends to the superior cavoatrial junction. Mild cardiomegaly. The lungs are clear. ABDOMEN/PELVIS: There is no hypermetabolic activity within the liver, adrenal glands, spleen or pancreas. There is no hypermetabolic nodal activity in the abdomen or pelvis. Prominent and a rectal activity, likely physiologic. The bowel activity is otherwise within physiologic limits. Incidental CT findings: Stable bilateral renal cysts without hypermetabolic activity for which no follow-up imaging is recommended. Mild aortoiliac atherosclerosis. SKELETON: There is no hypermetabolic activity to suggest  osseous metastatic disease. Incidental CT findings: Stable trabecular thickening in the iliac bones bilaterally. Stable lumbar spondylosis and osteitis pubis. IMPRESSION: 1. Progressive enlargement and hypermetabolic activity within the previously demonstrated right level II cervical and left paratracheal lymph nodes consistent with progressive metastatic disease. 2. No evidence of metastatic disease in the abdomen or pelvis. 3. No evidence of osseous metastatic disease. 4.  Aortic Atherosclerosis (ICD10-I70.0). Electronically Signed   By: Carey Bullocks M.D.   On: 11/08/2022 13:32      IMPRESSION/PLAN:  This is a delightful patient with laryngeal head and neck cancer and lymphoma, s/p RT for both.  Now with metastatic squamous cell carcinoma to right neck and mediastinum, progressive on systemic therapy. I recommend 6.5 weeks of radiotherapy for this patient.  We discussed the potential risks, benefits, and side effects of radiotherapy. We talked in detail about acute and late effects. We discussed that some of the most bothersome acute effects may be mucositis, pain with swallowing, dysgeusia, salivary changes, skin irritation, hair loss, and fatigue. We talked about late effects which include but are not necessarily limited to pneumonitis, dysphagia, hypothyroidism, nerve injury, vascular injury, spinal cord injury, fatal bleed, edema, and potential injury to any of the tissues in the chest or head and neck region. No guarantees of treatment were given. He understands treatment is curative in intent, but his prognosis is guarded given his oligo metastatic disease. Risk of toxicity is increased due to prior radiation to the neck.  I think the potential benefits of RT far outweigh potential risks. A consent form was signed and placed in the patient's medical record. The patient is enthusiastic about proceeding with treatment. I look forward to participating in the patient's care.    Simulation (treatment  planning) will take place July 15.  On date of service, in total, I spent 50 minutes on this encounter. Patient was seen in person.  __________________________________________   Lonie Peak, MD  This document serves as a record of services personally performed by Lonie Peak, MD. It was created on her behalf by Neena Rhymes, a trained medical scribe. The creation of this record is based on the scribe's personal observations and the provider's statements to them. This document has been checked and approved by the attending provider.

## 2022-11-09 NOTE — Progress Notes (Addendum)
Head and Neck Cancer Location of Tumor / Histology:  metastatic laryngeal squamous cell carcinoma   CT SOFT TISSUE NECK W CONTRAST 05/05/2022  IMPRESSION: 1. Interval development of a 2.3 x 2 cm heterogeneous and apparently centrally necrotic right posterior cervical chain node at approximately the C3-4 level. 2. Interval increased symmetric fullness in the adenoidal eminence, lingual tonsils and palatine tonsils. This could be due to pharyngitis or lymphoproliferative etiology. 3. Encroachment on the posterior nasopharynx due to prominent adenoids, with narrowing of the pharyngeal airway due to the prominent palatine tonsils. 4. Previously there was mild thickening of the right vocal fold. Due to apposition and streak artifact through the larynx, the vocal folds are poorly visualized on today's but do not show an obvious mass, adjacent cartilage erosion or paraglottic fat infiltration. 5. Degenerative changes of the cervical spine with short pedicles reducing the effective AP spinal canal diameter. 6. Posterior disc osteophyte complexes mildly compressing the cord at C5-6 and C6-7.  NM PET Image Restag (PS) Skull Base To Thigh 06/08/2022  IMPRESSION: 1. The recently demonstrated right cervical and left superior mediastinal adenopathy is moderately hypermetabolic, consistent with metastatic disease. 2. Nonspecific mildly prominent activity within the nasopharynx and left pharyngeal tonsil, potentially reactive. Recommend correlation with direct visualization. 3. The previously demonstrated left pelvic and inguinal adenopathy has resolved. No evidence of distant metastatic disease. No findings highly suspicious for recurrent lymphoma. 4.  Aortic Atherosclerosis (ICD10-I70.0).  NM PET Image Restag (PS) Skull Base To Thigh 11/07/2022  IMPRESSION: 1. Progressive enlargement and hypermetabolic activity within the previously demonstrated right level II cervical and left paratracheal  lymph nodes consistent with progressive metastatic disease. 2. No evidence of metastatic disease in the abdomen or pelvis. 3. No evidence of osseous metastatic disease. 4.  Aortic Atherosclerosis (ICD10-I70.0).  Patient presented with symptoms of:  He reports that he does have night sweats at times.  He reported having headaches approximate 1 month [place]  He thinks his headaches are related to not having his blood pressure medication.  His headaches have now resolved.  Biopsies revealed:  05-25-22 DIAGNOSIS:  A. LYMPH NODE, RIGHT NECK; ULTRASOUND-GUIDED CORE NEEDLE BIOPSY:  - POSITIVE FOR MALIGNANCY.  - METASTATIC KERATINIZING SQUAMOUS CELL CARCINOMA.   Comment:  The patient's previous diagnosis of squamous cell carcinoma of the  larynx ([id number]) is noted.   07-22-22 FINAL MICROSCOPIC DIAGNOSIS:   A. PARATRACHEAL MASS, LEFT, EXCISION:  - FIBROUS TISSUE INVOLVED BY SQUAMOUS CELL CARCINOMA  - THREE BENIGN LYMPH NODES, NEGATIVE FOR METASTATIC CARCINOMA (0/3)  - SEE COMMENT   B. PARATRACHEAL MASS, LEFT, EXCISION:  - FIBROUS TISSUE INVOLVED BY SQUAMOUS CELL CARCINOMA  - ADJACENT BENIGN LYMPHOID TISSUE, NEGATIVE FOR METASTATIC CARCINOMA  - SEE COMMENT   COMMENT:  Dr. Reynolds Bowl reviewed the case and agrees with the above diagnosis.   INTRAOPERATIVE DIAGNOSIS:   A. PARATRACHEAL MASS, LEFT, EXCISION: "Squamous cell carcinoma."  Intraoperative diagnosis rendered by Dr. Venetia Night at 14:20 on 22 July 2022.   Nutrition Status Yes No Comments  Weight changes? [x]  []  Weight is coming back after chemo, chemo affect taste buds  Swallowing concerns? [x]  []  Feels like pills get stucks  PEG? []  [x]     Referrals Yes No Comments  Social Work? []  [x]    Dentistry? []  [x]  Been on hold due to chemo  Swallowing therapy? []  [x]    Nutrition? []  [x]    Med/Onc? [x]  []  Dr. Candise Che   Safety Issues Yes No Comments  Prior radiation? [x]  []  For head and neck cancer    Pacemaker/ICD? []  [x]    Possible current pregnancy? []  [x]    Is the patient on methotrexate? []  [x]     Tobacco/Marijuana/Snuff/ETOH use: former smoker  Past/Anticipated interventions by otolaryngology, if any:  Hoshal, Bjorn Loser, MD - 07/05/2022  HISTORY OF PRESENT ILLNESS: Jesus [name] is a 67 y.o. 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 Basilio Cairo who underwent a recent direct laryngoscopy with biopsies with Dr. Annalee Genta February 03, 2022 due to concerns for laryngeal abnormalities. Biopsies were negative for squamous cell carcinoma. Patient was last seen for follow-up with Dr. Annalee Genta number 11/2021. In the interim the patient has undergone further workup with a PET CT January 31 demonstrating right cervical and left superior medial mediastinal adenopathy concerning for metastatic carcinoma. Recent ultrasound-guided fine-needle biopsy demonstrated metastatic squamous cell carcinoma, negative for EBV or p16.  Patient reports increasing hoarseness the past couple of weeks. Denies dysphagia or odynophagia. Feels like there is a lump in his throat. Has had intermittent bilateral ear pain. Noted the right neck mass developed in December. States he received chemotherapy for his lymphoma about 6 7 years ago. Gave up smoking when diagnosed with larynx cancer. Otherwise had smoked for about 42 years.   Flexible Fiberoptic Endoscopy (CPT [id number]):  Indication for procedure: Flexible laryngoscopy was indicated for history of larynx cancer After verbal consent was obtained, 4% lidocaine and oxymetazoline was sprayed into the patient's bilateral nasal cavities. A flexible fiberoptic laryngoscope was passed through the patient's left naris down to the level of the cords.  Nasal cavity: The septum is midline. The nasal cavity is normal. There are no masses, lesions, or polyps. There is no epistaxis or  discharge.  Nasopharynx: The eustachian tube orifice and fossa of Rossenmuller are normal. There are no masses or lesions. +Symmeteric adenoid hypertrophy without gross evidence of tumor.   RECOMMENDATIONS:  I recommended tumor board evaluation of this patient given complex history. I suspect further treatment will likely be with radiation or chemotherapy as a metastatic peritracheal lymph node would preclude a role for neck dissection surgery in my opinion. I think a repeat laryngeal biopsy is likely low yield given recent biopsy and absence of FDG avidity on PET/CT, however I will discuss this with the tumor board. Also discussed that adenoid hypertrophy appears benign despite FDG avidity on PET/CT. Can also discuss role for adenoid resection/biopsy with tumor board.  I reviewed all the imaging with the patient and answered all of his questions. He was grateful for the consultation. He has follow-up follow-up with thoracic surgery this Friday and follows closely with Dr. Basilio Cairo, radiation oncologist.   Past/Anticipated interventions by medical oncology, if any:  Dr. Candise Che 10-17-22 HISTORY OF PRESENTING ILLNESS:    RHONIN LAURIANO 67 y.o. male is here because of left lower extremity edema and lymphadenopathy.  The patient was seen in the emergency room this past Friday for the same issue.  A CT of the abdomen and pelvis was performed showing bulky left inguinal, left hemipelvic, and retroperitoneal adenopathy.  He was referred to [place] from the emergency room for further evaluation.  Doppler ultrasound of the left lower extremity was performed and was negative for DVT. Patient reports that he has been having left lower extremity edema in his left groin and left leg for approximately 1 month.  He states that the swelling in the  left groin started to get better but then worsened.  The left lower extremity edema has slowly worsened over time.  Patient denies having fevers and chills.  He reports that he does  have night sweats at times.  He reported having headaches approximate 1 month ago [place].  He thinks his headaches are related to not having his blood pressure medication.  His headaches have now resolved.  He denies visual changes.  The patient denies chest pain, shortness of breath and cough.  No nausea, vomiting, constipation, diarrhea.  Denies abdominal pain.  The patient denies recent weight loss and has actually gained weight recently.  Patient denies epistaxis, bleeding gums, hemoptysis, hematuria, but occasionally, and melena.  He reports increased urinary frequency over the past month but no dysuria.  The patient is here for evaluation and discussion of his recent CT and lab findings.  ASSESSMENT & PLAN:    This is a pleasant 67 y.o. 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   CT of the abdomen and pelvis performed on 12/22/2017 showed bulky left inguinal, left hemipelvic, and  retroperitoneal adenopathy.     01/02/18 Left inguinal LN Biopsy revealed T-Cell/histocyte rich Large B-Cell Lymphoma   12/27/17 ECHO revealed LV EF of 55-60%    01/05/18 PET/CT revealed Massively enlarged pelvic lymph nodes intense metabolic activity consistent lymphoma. 2. Additional hypermetabolic lymph nodes in the porta hepatis and retroperitoneum LEFT aorta. 3. Solitary hypermetabolic mediastinal lymph node in the upper LEFT Mediastinum. 4. 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 5. Normal spleen.     HIV non reactive on 12/22/2017.  Hep C and hep B serology negative.   03/14/18 PET/CT revealed PET-CT findings suggest an excellent response to chemotherapy. The abdominal lymphadenopathy  has near completely resolved and demonstrates a near complete metabolic response. The pelvic and inguinal adenopathy has significantly decreased in size and the metabolic activity has significantly decreased. 2. Diffuse marrow activity likely due to chemotherapy and or marrow stimulating drugs. I do not see any discrete persistent lesions.    04/17/18 CT Head revealed Subtle mesial caudothalamic hypodensities may be artifact though, the could reflect encephalitis or Wernicke's encephalopathy. Consider MRI of the head with and without contrast. 2. Mild chronic small vessel ischemic changes     S/p 6 cycles of EPOCH-R completed on 06/01/18   06/28/18 PET/CT revealed Continued good response to treatment. No residual measurable or hypermetabolic abdominal lymphadenopathy and no recurrent osseous disease. 2. Interval decrease in size of the left operator region lymph node and also the left inguinal lymph node. However, the both have small foci of slightly increased hypermetabolism which bears surveillance. 3. No new or progressive lymphadenopathy in the neck, chest, abdomen or pelvis.   S/p 39.6 Gy in 22 fractions between 07/25/18 and 08/23/18   02/13/2019 CT C/A/P ([id number]) ([id number]) revealed "1. Response to therapy of pelvic adenopathy compared to the PET of 06/28/2018. 2. No new or progressive disease. 3. Mild prostatomegaly. 4. 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 5. Hepatomegaly."   2) left lower extremity swelling- now resolved  Doppler ultrasound for DVT was negative in the left lower extremity.   Likely from venous compression +/- lymphatic obstruction from bulky left inguinal, left  hemipelvic, and  retroperitoneal adenopathy.    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   PLAN: -Discussed lab results from today, 10/17/2022, in detail with patient. CBC results showed decreased hemoglobin at 10.2 and decreased hematocrit at 31.6%.  -CMP stable -Advised patient to continue limited sun exposure during chemotherapy. -Patient tolerated cycle 3 of his  treatment well without any new or severe toxicities.  -Patient can proceed with cycle 4 of his treatment today without any dose modification.  -Schedule the PET scan after cycle 4, in 1-2 weeks.  -Discussed that we will drop chemotherapy and just continue with immunotherapy, Pembrolizumab, if PET scan does not show any abnormalities. And will consider ISRT to can residual lesions    Pulmonology intervention:  DATE OF PROCEDURE: 07/22/2022   PREOPERATIVE DIAGNOSIS:  Left paratracheal adenopathy.   POSTOPERATIVE DIAGNOSIS:  Metastatic squamous cell carcinoma.   PROCEDURE:  Mediastinoscopy.   SURGEON:  Salvatore Decent. Dorris Fetch, MD    Current Complaints / other details:  No major concerns or questions. He has been through radiation before with Dr. Basilio Cairo.

## 2022-11-09 NOTE — Telephone Encounter (Signed)
Rn called pt for meaningful use and Nurse evaluation information. Note routed to Dr. Basilio Cairo for review.

## 2022-11-11 ENCOUNTER — Encounter: Payer: Self-pay | Admitting: Radiation Oncology

## 2022-11-11 ENCOUNTER — Ambulatory Visit
Admission: RE | Admit: 2022-11-11 | Discharge: 2022-11-11 | Disposition: A | Discharge status: Home / Self Care | Payer: Medicare HMO | Source: Ambulatory Visit | Attending: Radiation Oncology | Admitting: Radiation Oncology

## 2022-11-11 ENCOUNTER — Ambulatory Visit: Payer: Medicare HMO

## 2022-11-11 ENCOUNTER — Ambulatory Visit: Payer: Medicare HMO | Admitting: Radiation Oncology

## 2022-11-11 VITALS — BP 111/74 | HR 94 | Temp 98.0°F | Resp 18 | Ht 72.0 in | Wt 290.5 lb

## 2022-11-11 DIAGNOSIS — C4492 Squamous cell carcinoma of skin, unspecified: Secondary | ICD-10-CM

## 2022-11-11 DIAGNOSIS — C770 Secondary and unspecified malignant neoplasm of lymph nodes of head, face and neck: Secondary | ICD-10-CM

## 2022-11-11 DIAGNOSIS — C771 Secondary and unspecified malignant neoplasm of intrathoracic lymph nodes: Secondary | ICD-10-CM | POA: Insufficient documentation

## 2022-11-11 DIAGNOSIS — C320 Malignant neoplasm of glottis: Secondary | ICD-10-CM | POA: Diagnosis not present

## 2022-11-11 NOTE — Progress Notes (Signed)
Oncology Nurse Navigator Documentation   I met with Jesus [name] before and during his reconsultation with Dr. Basilio Cairo today. He is aware that he will arrive at 2:15 on 7/15 for an IV start and CT simulation for radiation to his right neck and mid chest area. He will apply EMLA cream for easier access of his PAC. I advised him that he could call me for any questions or concerns that he may have before then.   Hedda Slade RN, BSN, OCN Head & Neck Oncology Nurse Navigator  Cancer Center at [facility] Phone # [phone]  Fax # [phone]

## 2022-11-14 ENCOUNTER — Encounter: Payer: Self-pay | Admitting: Hematology

## 2022-11-21 ENCOUNTER — Ambulatory Visit
Admission: RE | Admit: 2022-11-21 | Discharge: 2022-11-21 | Disposition: A | Discharge status: Home / Self Care | Payer: Medicare HMO | Source: Ambulatory Visit | Attending: Radiation Oncology | Admitting: Radiation Oncology

## 2022-11-21 ENCOUNTER — Other Ambulatory Visit: Payer: Self-pay

## 2022-11-21 VITALS — BP 114/75 | HR 100 | Resp 18

## 2022-11-21 DIAGNOSIS — C771 Secondary and unspecified malignant neoplasm of intrathoracic lymph nodes: Secondary | ICD-10-CM | POA: Diagnosis not present

## 2022-11-21 DIAGNOSIS — C770 Secondary and unspecified malignant neoplasm of lymph nodes of head, face and neck: Secondary | ICD-10-CM | POA: Insufficient documentation

## 2022-11-21 DIAGNOSIS — Z510 Encounter for antineoplastic radiation therapy: Secondary | ICD-10-CM | POA: Insufficient documentation

## 2022-11-21 DIAGNOSIS — C320 Malignant neoplasm of glottis: Secondary | ICD-10-CM | POA: Diagnosis not present

## 2022-11-21 DIAGNOSIS — Z95828 Presence of other vascular implants and grafts: Secondary | ICD-10-CM

## 2022-11-21 DIAGNOSIS — C801 Malignant (primary) neoplasm, unspecified: Secondary | ICD-10-CM | POA: Diagnosis not present

## 2022-11-21 MED ORDER — SODIUM CHLORIDE 0.9% FLUSH
10.0000 mL | Freq: Once | INTRAVENOUS | Status: AC
  Administered 2022-11-21: 10 mL

## 2022-11-21 MED ORDER — HEPARIN SOD (PORK) LOCK FLUSH 100 UNIT/ML IV SOLN
500.0000 [IU] | Freq: Once | INTRAVENOUS | Status: AC
  Administered 2022-11-21: 500 [IU]

## 2022-11-22 ENCOUNTER — Other Ambulatory Visit: Payer: Self-pay

## 2022-11-24 ENCOUNTER — Other Ambulatory Visit: Payer: Self-pay

## 2022-11-28 ENCOUNTER — Inpatient Hospital Stay: Payer: Medicare HMO

## 2022-11-28 ENCOUNTER — Other Ambulatory Visit: Payer: Self-pay

## 2022-11-28 VITALS — BP 138/80 | HR 76 | Temp 98.1°F | Resp 18 | Wt 292.2 lb

## 2022-11-28 DIAGNOSIS — Z95828 Presence of other vascular implants and grafts: Secondary | ICD-10-CM

## 2022-11-28 DIAGNOSIS — Z86718 Personal history of other venous thrombosis and embolism: Secondary | ICD-10-CM | POA: Diagnosis not present

## 2022-11-28 DIAGNOSIS — Z510 Encounter for antineoplastic radiation therapy: Secondary | ICD-10-CM | POA: Diagnosis not present

## 2022-11-28 DIAGNOSIS — Z7189 Other specified counseling: Secondary | ICD-10-CM

## 2022-11-28 DIAGNOSIS — C760 Malignant neoplasm of head, face and neck: Secondary | ICD-10-CM

## 2022-11-28 DIAGNOSIS — Z5112 Encounter for antineoplastic immunotherapy: Secondary | ICD-10-CM | POA: Diagnosis not present

## 2022-11-28 DIAGNOSIS — C771 Secondary and unspecified malignant neoplasm of intrathoracic lymph nodes: Secondary | ICD-10-CM | POA: Diagnosis not present

## 2022-11-28 DIAGNOSIS — Z87891 Personal history of nicotine dependence: Secondary | ICD-10-CM | POA: Diagnosis not present

## 2022-11-28 DIAGNOSIS — C801 Malignant (primary) neoplasm, unspecified: Secondary | ICD-10-CM | POA: Diagnosis not present

## 2022-11-28 DIAGNOSIS — C770 Secondary and unspecified malignant neoplasm of lymph nodes of head, face and neck: Secondary | ICD-10-CM | POA: Diagnosis not present

## 2022-11-28 DIAGNOSIS — Z79899 Other long term (current) drug therapy: Secondary | ICD-10-CM | POA: Diagnosis not present

## 2022-11-28 DIAGNOSIS — C320 Malignant neoplasm of glottis: Secondary | ICD-10-CM | POA: Diagnosis not present

## 2022-11-28 LAB — CMP (CANCER CENTER ONLY)
ALT: 7 U/L (ref 0–44)
AST: 11 U/L — ABNORMAL LOW (ref 15–41)
Albumin: 4.1 g/dL (ref 3.5–5.0)
Alkaline Phosphatase: 66 U/L (ref 38–126)
Anion gap: 6 (ref 5–15)
BUN: 12 mg/dL (ref 8–23)
CO2: 28 mmol/L (ref 22–32)
Calcium: 9.5 mg/dL (ref 8.9–10.3)
Chloride: 105 mmol/L (ref 98–111)
Creatinine: 0.88 mg/dL (ref 0.61–1.24)
GFR, Estimated: >60 mL/min (ref >60)
Glucose, Bld: 142 mg/dL — ABNORMAL HIGH (ref 70–99)
Potassium: 4.1 mmol/L (ref 3.5–5.1)
Sodium: 139 mmol/L (ref 135–145)
Total Bilirubin: 0.5 mg/dL (ref 0.3–1.2)
Total Protein: 7 g/dL (ref 6.5–8.1)

## 2022-11-28 LAB — CBC WITH DIFFERENTIAL (CANCER CENTER ONLY)
Abs Immature Granulocytes: 0.02 10*3/uL (ref 0.00–0.07)
Basophils Absolute: 0 10*3/uL (ref 0.0–0.1)
Basophils Relative: 1 %
Eosinophils Absolute: 0.1 10*3/uL (ref 0.0–0.5)
Eosinophils Relative: 1 %
HCT: 31.9 % — ABNORMAL LOW (ref 39.0–52.0)
Hemoglobin: 10.6 g/dL — ABNORMAL LOW (ref 13.0–17.0)
Immature Granulocytes: 0 %
Lymphocytes Relative: 46 %
Lymphs Abs: 3.1 10*3/uL (ref 0.7–4.0)
MCH: 31.5 pg (ref 26.0–34.0)
MCHC: 33.2 g/dL (ref 30.0–36.0)
MCV: 94.9 fL (ref 80.0–100.0)
Monocytes Absolute: 0.6 10*3/uL (ref 0.1–1.0)
Monocytes Relative: 9 %
Neutro Abs: 2.9 10*3/uL (ref 1.7–7.7)
Neutrophils Relative %: 43 %
Platelet Count: 362 10*3/uL (ref 150–400)
RBC: 3.36 MIL/uL — ABNORMAL LOW (ref 4.22–5.81)
RDW: 13.8 % (ref 11.5–15.5)
WBC Count: 6.7 10*3/uL (ref 4.0–10.5)
nRBC: 0 % (ref 0.0–0.2)

## 2022-11-28 MED ORDER — SODIUM CHLORIDE 0.9% FLUSH
10.0000 mL | INTRAVENOUS | Status: DC | PRN
  Administered 2022-11-28: 10 mL

## 2022-11-28 MED ORDER — SODIUM CHLORIDE 0.9 % IV SOLN: Freq: Once | INTRAVENOUS | Status: AC

## 2022-11-28 MED ORDER — SODIUM CHLORIDE 0.9% FLUSH
10.0000 mL | Freq: Once | INTRAVENOUS | Status: AC
  Administered 2022-11-28: 10 mL

## 2022-11-28 MED ORDER — SODIUM CHLORIDE 0.9 % IV SOLN
200.0000 mg | Freq: Once | INTRAVENOUS | Status: AC
  Administered 2022-11-28: 200 mg via INTRAVENOUS
  Filled 2022-11-28: qty 200

## 2022-11-28 MED ORDER — HEPARIN SOD (PORK) LOCK FLUSH 100 UNIT/ML IV SOLN
500.0000 [IU] | Freq: Once | INTRAVENOUS | Status: AC | PRN
  Administered 2022-11-28: 500 [IU]

## 2022-11-28 NOTE — Patient Instructions (Signed)
[facility]  Discharge Instructions: Thank you for choosing [facility] to provide your oncology and hematology care.   If you have a lab appointment with the [facility], please go directly to the [facility] and check in at the registration area.   Wear comfortable clothing and clothing appropriate for easy access to any Portacath or PICC line.   We strive to give you quality time with your provider. You may need to reschedule your appointment if you arrive late (15 or more minutes).  Arriving late affects you and other patients whose appointments are after yours.  Also, if you miss three or more appointments without notifying the office, you may be dismissed from the clinic at the provider's discretion.      For prescription refill requests, have your pharmacy contact our office and allow 72 hours for refills to be completed.    Today you received the following chemotherapy and/or immunotherapy agents: Keytruda      To help prevent nausea and vomiting after your treatment, we encourage you to take your nausea medication as directed.  BELOW ARE SYMPTOMS THAT SHOULD BE REPORTED IMMEDIATELY: *FEVER GREATER THAN 100.4 F (38 C) OR HIGHER *CHILLS OR SWEATING *NAUSEA AND VOMITING THAT IS NOT CONTROLLED WITH YOUR NAUSEA MEDICATION *UNUSUAL SHORTNESS OF BREATH *UNUSUAL BRUISING OR BLEEDING *URINARY PROBLEMS (pain or burning when urinating, or frequent urination) *BOWEL PROBLEMS (unusual diarrhea, constipation, pain near the anus) TENDERNESS IN MOUTH AND THROAT WITH OR WITHOUT PRESENCE OF ULCERS (sore throat, sores in mouth, or a toothache) UNUSUAL RASH, SWELLING OR PAIN  UNUSUAL VAGINAL DISCHARGE OR ITCHING   Items with * indicate a potential emergency and should be followed up as soon as possible or go to the Emergency Department if any problems should occur.  Please show the CHEMOTHERAPY ALERT CARD or IMMUNOTHERAPY ALERT CARD at  check-in to the Emergency Department and triage nurse.  Should you have questions after your visit or need to cancel or reschedule your appointment, please contact [facility]  Dept: [phone]  and follow the prompts.  Office hours are 8:00 a.m. to 4:30 p.m. Monday - Friday. Please note that voicemails left after 4:00 p.m. may not be returned until the following business day.  We are closed weekends and major holidays. You have access to a nurse at all times for urgent questions. Please call the main number to the clinic Dept: [phone] and follow the prompts.   For any non-urgent questions, you may also contact your provider using MyChart. We now offer e-Visits for anyone 14 and older to request care online for non-urgent symptoms. For details visit [email or web address].   Also download the MyChart app! Go to the app store, search "MyChart", open the app, select [facility], and log in with your MyChart username and password.

## 2022-11-29 ENCOUNTER — Ambulatory Visit
Admission: RE | Admit: 2022-11-29 | Discharge: 2022-11-29 | Disposition: A | Discharge status: Home / Self Care | Payer: Medicare HMO | Source: Ambulatory Visit | Attending: Radiation Oncology | Admitting: Radiation Oncology

## 2022-11-29 ENCOUNTER — Other Ambulatory Visit: Payer: Self-pay

## 2022-11-29 DIAGNOSIS — C801 Malignant (primary) neoplasm, unspecified: Secondary | ICD-10-CM | POA: Diagnosis not present

## 2022-11-29 DIAGNOSIS — C770 Secondary and unspecified malignant neoplasm of lymph nodes of head, face and neck: Secondary | ICD-10-CM | POA: Diagnosis not present

## 2022-11-29 DIAGNOSIS — C320 Malignant neoplasm of glottis: Secondary | ICD-10-CM | POA: Diagnosis not present

## 2022-11-29 DIAGNOSIS — Z510 Encounter for antineoplastic radiation therapy: Secondary | ICD-10-CM | POA: Diagnosis not present

## 2022-11-29 DIAGNOSIS — C771 Secondary and unspecified malignant neoplasm of intrathoracic lymph nodes: Secondary | ICD-10-CM | POA: Diagnosis not present

## 2022-11-29 LAB — RAD ONC ARIA SESSION SUMMARY
Course Elapsed Days: 0
Plan Fractions Treated to Date: 1
Plan Prescribed Dose Per Fraction: 2 Gy
Plan Total Fractions Prescribed: 30
Plan Total Prescribed Dose: 60 Gy
Reference Point Dosage Given to Date: 2 Gy
Reference Point Session Dosage Given: 2 Gy
Session Number: 1

## 2022-11-29 NOTE — Progress Notes (Signed)
Oncology Nurse Navigator Documentation   To provide support, encouragement and care continuity, met with Jesus [name] for his initial RT.   I reviewed the 2-step treatment process, answered questions.  Mr. Kottke completed treatment without difficulty, denied questions/concerns. I reviewed the registration/arrival procedure for subsequent treatments. I encouraged him to call me with questions/concerns as treatments proceed.   Hedda Slade RN, BSN, OCN Head & Neck Oncology Nurse Navigator [facility] Phone # [phone]  Fax # [phone]

## 2022-11-30 ENCOUNTER — Other Ambulatory Visit: Payer: Self-pay

## 2022-11-30 ENCOUNTER — Ambulatory Visit
Admission: RE | Admit: 2022-11-30 | Discharge: 2022-11-30 | Disposition: A | Discharge status: Home / Self Care | Payer: Medicare HMO | Source: Ambulatory Visit | Attending: Radiation Oncology | Admitting: Radiation Oncology

## 2022-11-30 DIAGNOSIS — C770 Secondary and unspecified malignant neoplasm of lymph nodes of head, face and neck: Secondary | ICD-10-CM | POA: Diagnosis not present

## 2022-11-30 DIAGNOSIS — C801 Malignant (primary) neoplasm, unspecified: Secondary | ICD-10-CM | POA: Diagnosis not present

## 2022-11-30 DIAGNOSIS — C320 Malignant neoplasm of glottis: Secondary | ICD-10-CM | POA: Diagnosis not present

## 2022-11-30 DIAGNOSIS — Z510 Encounter for antineoplastic radiation therapy: Secondary | ICD-10-CM | POA: Diagnosis not present

## 2022-11-30 DIAGNOSIS — C771 Secondary and unspecified malignant neoplasm of intrathoracic lymph nodes: Secondary | ICD-10-CM | POA: Diagnosis not present

## 2022-11-30 LAB — RAD ONC ARIA SESSION SUMMARY
Course Elapsed Days: 1
Plan Fractions Treated to Date: 2
Plan Prescribed Dose Per Fraction: 2 Gy
Plan Total Fractions Prescribed: 30
Plan Total Prescribed Dose: 60 Gy
Reference Point Dosage Given to Date: 4 Gy
Reference Point Session Dosage Given: 2 Gy
Session Number: 2

## 2022-12-01 ENCOUNTER — Other Ambulatory Visit: Payer: Self-pay

## 2022-12-01 ENCOUNTER — Ambulatory Visit
Admission: RE | Admit: 2022-12-01 | Discharge: 2022-12-01 | Disposition: A | Discharge status: Home / Self Care | Payer: Medicare HMO | Source: Ambulatory Visit | Attending: Radiation Oncology | Admitting: Radiation Oncology

## 2022-12-01 DIAGNOSIS — C770 Secondary and unspecified malignant neoplasm of lymph nodes of head, face and neck: Secondary | ICD-10-CM | POA: Diagnosis not present

## 2022-12-01 DIAGNOSIS — C771 Secondary and unspecified malignant neoplasm of intrathoracic lymph nodes: Secondary | ICD-10-CM | POA: Diagnosis not present

## 2022-12-01 DIAGNOSIS — Z510 Encounter for antineoplastic radiation therapy: Secondary | ICD-10-CM | POA: Diagnosis not present

## 2022-12-01 DIAGNOSIS — C320 Malignant neoplasm of glottis: Secondary | ICD-10-CM | POA: Diagnosis not present

## 2022-12-01 DIAGNOSIS — C801 Malignant (primary) neoplasm, unspecified: Secondary | ICD-10-CM | POA: Diagnosis not present

## 2022-12-01 LAB — RAD ONC ARIA SESSION SUMMARY
Course Elapsed Days: 2
Plan Fractions Treated to Date: 3
Plan Prescribed Dose Per Fraction: 2 Gy
Plan Total Fractions Prescribed: 30
Plan Total Prescribed Dose: 60 Gy
Reference Point Dosage Given to Date: 6 Gy
Reference Point Session Dosage Given: 2 Gy
Session Number: 3

## 2022-12-02 ENCOUNTER — Ambulatory Visit: Payer: Medicare HMO

## 2022-12-05 ENCOUNTER — Ambulatory Visit: Payer: Medicare HMO

## 2022-12-06 ENCOUNTER — Other Ambulatory Visit: Payer: Self-pay

## 2022-12-06 ENCOUNTER — Other Ambulatory Visit: Payer: Self-pay | Admitting: Family Medicine

## 2022-12-06 ENCOUNTER — Ambulatory Visit
Admission: RE | Admit: 2022-12-06 | Discharge: 2022-12-06 | Disposition: A | Discharge status: Home / Self Care | Payer: Medicare HMO | Source: Ambulatory Visit | Attending: Radiation Oncology | Admitting: Radiation Oncology

## 2022-12-06 ENCOUNTER — Other Ambulatory Visit: Payer: Self-pay | Admitting: Radiation Oncology

## 2022-12-06 DIAGNOSIS — C770 Secondary and unspecified malignant neoplasm of lymph nodes of head, face and neck: Secondary | ICD-10-CM | POA: Diagnosis not present

## 2022-12-06 DIAGNOSIS — C320 Malignant neoplasm of glottis: Secondary | ICD-10-CM | POA: Diagnosis not present

## 2022-12-06 DIAGNOSIS — C771 Secondary and unspecified malignant neoplasm of intrathoracic lymph nodes: Secondary | ICD-10-CM | POA: Diagnosis not present

## 2022-12-06 DIAGNOSIS — C4492 Squamous cell carcinoma of skin, unspecified: Secondary | ICD-10-CM

## 2022-12-06 DIAGNOSIS — Z510 Encounter for antineoplastic radiation therapy: Secondary | ICD-10-CM | POA: Diagnosis not present

## 2022-12-06 DIAGNOSIS — C801 Malignant (primary) neoplasm, unspecified: Secondary | ICD-10-CM | POA: Diagnosis not present

## 2022-12-06 LAB — RAD ONC ARIA SESSION SUMMARY
Course Elapsed Days: 7
Plan Fractions Treated to Date: 4
Plan Prescribed Dose Per Fraction: 2 Gy
Plan Total Fractions Prescribed: 30
Plan Total Prescribed Dose: 60 Gy
Reference Point Dosage Given to Date: 8 Gy
Reference Point Session Dosage Given: 2 Gy
Session Number: 4

## 2022-12-07 ENCOUNTER — Other Ambulatory Visit: Payer: Self-pay

## 2022-12-07 ENCOUNTER — Ambulatory Visit
Admission: RE | Admit: 2022-12-07 | Discharge: 2022-12-07 | Disposition: A | Discharge status: Home / Self Care | Payer: Medicare HMO | Source: Ambulatory Visit | Attending: Radiation Oncology | Admitting: Radiation Oncology

## 2022-12-07 DIAGNOSIS — C770 Secondary and unspecified malignant neoplasm of lymph nodes of head, face and neck: Secondary | ICD-10-CM | POA: Diagnosis not present

## 2022-12-07 DIAGNOSIS — C801 Malignant (primary) neoplasm, unspecified: Secondary | ICD-10-CM | POA: Diagnosis not present

## 2022-12-07 DIAGNOSIS — C320 Malignant neoplasm of glottis: Secondary | ICD-10-CM | POA: Diagnosis not present

## 2022-12-07 DIAGNOSIS — C771 Secondary and unspecified malignant neoplasm of intrathoracic lymph nodes: Secondary | ICD-10-CM | POA: Diagnosis not present

## 2022-12-07 DIAGNOSIS — Z510 Encounter for antineoplastic radiation therapy: Secondary | ICD-10-CM | POA: Diagnosis not present

## 2022-12-07 LAB — RAD ONC ARIA SESSION SUMMARY
Course Elapsed Days: 8
Plan Fractions Treated to Date: 5
Plan Prescribed Dose Per Fraction: 2 Gy
Plan Total Fractions Prescribed: 30
Plan Total Prescribed Dose: 60 Gy
Reference Point Dosage Given to Date: 10 Gy
Reference Point Session Dosage Given: 2 Gy
Session Number: 5

## 2022-12-07 NOTE — Telephone Encounter (Signed)
Unable to refill per protocol, last refill by another provider.   Requested Prescriptions  Pending Prescriptions Disp Refills   lidocaine (XYLOCAINE) 2 % solution [Pharmacy Med Name: Lidocaine Viscous HCl 2 % Mouth/Throat Solution] 100 mL 0    Sig: USE AS DIRECTED 10  MLS  IN  MOUTH  OR  THROAT  EVERY  3  HOURS  AS  NEEDED  FOR  MOUTH  PAIN     There is no refill protocol information for this order

## 2022-12-08 ENCOUNTER — Ambulatory Visit
Admission: RE | Admit: 2022-12-08 | Discharge: 2022-12-08 | Disposition: A | Discharge status: Home / Self Care | Payer: Medicare HMO | Source: Ambulatory Visit | Attending: Radiation Oncology | Admitting: Radiation Oncology

## 2022-12-08 ENCOUNTER — Other Ambulatory Visit: Payer: Self-pay

## 2022-12-08 DIAGNOSIS — C801 Malignant (primary) neoplasm, unspecified: Secondary | ICD-10-CM | POA: Insufficient documentation

## 2022-12-08 DIAGNOSIS — C770 Secondary and unspecified malignant neoplasm of lymph nodes of head, face and neck: Secondary | ICD-10-CM | POA: Insufficient documentation

## 2022-12-08 DIAGNOSIS — Z79620 Long term (current) use of immunosuppressive biologic: Secondary | ICD-10-CM | POA: Insufficient documentation

## 2022-12-08 DIAGNOSIS — Z510 Encounter for antineoplastic radiation therapy: Secondary | ICD-10-CM | POA: Diagnosis not present

## 2022-12-08 DIAGNOSIS — C320 Malignant neoplasm of glottis: Secondary | ICD-10-CM | POA: Diagnosis not present

## 2022-12-08 DIAGNOSIS — C771 Secondary and unspecified malignant neoplasm of intrathoracic lymph nodes: Secondary | ICD-10-CM | POA: Diagnosis not present

## 2022-12-08 LAB — RAD ONC ARIA SESSION SUMMARY
Course Elapsed Days: 9
Plan Fractions Treated to Date: 6
Plan Prescribed Dose Per Fraction: 2 Gy
Plan Total Fractions Prescribed: 30
Plan Total Prescribed Dose: 60 Gy
Reference Point Dosage Given to Date: 12 Gy
Reference Point Session Dosage Given: 2 Gy
Session Number: 6

## 2022-12-09 ENCOUNTER — Ambulatory Visit
Admission: RE | Admit: 2022-12-09 | Discharge: 2022-12-09 | Disposition: A | Discharge status: Home / Self Care | Payer: Medicare HMO | Source: Ambulatory Visit | Attending: Radiation Oncology | Admitting: Radiation Oncology

## 2022-12-09 ENCOUNTER — Other Ambulatory Visit: Payer: Self-pay | Admitting: Radiation Oncology

## 2022-12-09 ENCOUNTER — Other Ambulatory Visit: Payer: Self-pay

## 2022-12-09 DIAGNOSIS — Z510 Encounter for antineoplastic radiation therapy: Secondary | ICD-10-CM | POA: Diagnosis not present

## 2022-12-09 DIAGNOSIS — C771 Secondary and unspecified malignant neoplasm of intrathoracic lymph nodes: Secondary | ICD-10-CM

## 2022-12-09 DIAGNOSIS — C770 Secondary and unspecified malignant neoplasm of lymph nodes of head, face and neck: Secondary | ICD-10-CM | POA: Diagnosis not present

## 2022-12-09 DIAGNOSIS — C4492 Squamous cell carcinoma of skin, unspecified: Secondary | ICD-10-CM

## 2022-12-09 DIAGNOSIS — C801 Malignant (primary) neoplasm, unspecified: Secondary | ICD-10-CM | POA: Diagnosis not present

## 2022-12-09 DIAGNOSIS — C320 Malignant neoplasm of glottis: Secondary | ICD-10-CM | POA: Diagnosis not present

## 2022-12-09 DIAGNOSIS — Z79620 Long term (current) use of immunosuppressive biologic: Secondary | ICD-10-CM | POA: Diagnosis not present

## 2022-12-09 LAB — RAD ONC ARIA SESSION SUMMARY
Course Elapsed Days: 10
Plan Fractions Treated to Date: 7
Plan Prescribed Dose Per Fraction: 2 Gy
Plan Total Fractions Prescribed: 30
Plan Total Prescribed Dose: 60 Gy
Reference Point Dosage Given to Date: 14 Gy
Reference Point Session Dosage Given: 2 Gy
Session Number: 7

## 2022-12-09 MED ORDER — LIDOCAINE VISCOUS HCL 2 % MT SOLN
OROMUCOSAL | 2 refills | Status: DC
Start: 2022-12-09 — End: 2022-12-19

## 2022-12-11 ENCOUNTER — Ambulatory Visit
Admission: EM | Admit: 2022-12-11 | Discharge: 2022-12-11 | Disposition: A | Discharge status: Home / Self Care | Payer: Medicare HMO | Attending: Physician Assistant | Admitting: Physician Assistant

## 2022-12-11 ENCOUNTER — Encounter: Payer: Self-pay | Admitting: Emergency Medicine

## 2022-12-11 ENCOUNTER — Encounter: Payer: Self-pay | Admitting: Hematology

## 2022-12-11 DIAGNOSIS — T63441A Toxic effect of venom of bees, accidental (unintentional), initial encounter: Secondary | ICD-10-CM

## 2022-12-11 MED ORDER — DOXYCYCLINE HYCLATE 100 MG PO CAPS: 100.0000 mg | ORAL_CAPSULE | Freq: Two times a day (BID) | ORAL | 0 refills | Status: DC

## 2022-12-11 NOTE — ED Provider Notes (Signed)
[facility]    CSN: [id number] Arrival date & time: 12/11/22  1334      History   Chief Complaint Chief Complaint  Patient presents with   Insect Bite    HPI Jesus [name] is a 67 y.o. male.   Patient here today for evaluation of worsening redness and swelling to areas of yellowjacket stings 3 days ago.  He states that symptoms seem to worsen last night.  He has had some pain and itching.  He denies any fever.  He has not any shortness of breath or trouble swallowing.  He is currently undergoing radiation therapy.  The history is provided by the patient.    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- last radiation 06-2018   Cataract    removed both eyes with l;ens implants    COPD (chronic obstructive pulmonary disease) (HCC)    Family history of colon cancer    in his brother- dx'd age 55    History of chemotherapy    last 06-01-2018   History of kidney stones    History of radiation therapy    last radiation 06-2018   Hyperlipidemia    currently under control   Hypertension    Irregular heart beats    Lymphadenopathy    Pain, lower leg    Bilateral   Peripheral arterial disease (HCC)    Pre-diabetes    Red-green color blindness    RLS (restless legs syndrome)    Snores    Wears glasses     Patient Active Problem List   Diagnosis Date Noted   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   Pain due to onychomycosis of toenail 10/25/2022   Port-A-Cath in place 09/05/2022   Head and neck cancer (HCC) 08/04/2022   Chronic pain 07/15/2021   Diabetes mellitus without complication (HCC) 07/15/2021   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   Gastroesophageal reflux disease without esophagitis 07/15/2021   History  of lymphoma 07/15/2021   Lipoma of skin and subcutaneous tissue of trunk 07/15/2021   Mixed hyperlipidemia 07/15/2021   Prediabetes 07/15/2021   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   Tobacco dependence 07/15/2021   Trigger thumb of left hand 02/17/2021   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   Hoarseness 08/11/2020   Chronic pain of right knee 10/17/2018   Large cell (diffuse) non-Hodgkin's lymphoma (HCC) 05/10/2018   Bacteremia due to Escherichia coli    HTN (hypertension) 04/17/2018   RLS (restless legs syndrome) 04/17/2018   Abnormal LFTs 04/17/2018   Acute metabolic encephalopathy 04/16/2018   AKI (acute kidney injury) (HCC) 04/16/2018   Dehydration    Fever, unspecified    Sepsis (HCC)    Disorientation    Non-Hodgkin's lymphoma of skin (HCC) 04/03/2018   Hypokalemia    Hypomagnesemia    Anemia    Encounter for antineoplastic chemotherapy    At high risk of tumor lysis syndrome    Swelling of lower leg    Diffuse large B cell lymphoma (HCC) 01/15/2018   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HCC) 01/12/2018   Counseling regarding advance care  planning and goals of care 01/12/2018   Bilateral leg pain 05/27/2014    Past Surgical History:  Procedure Laterality Date   CATARACT EXTRACTION W/ INTRAOCULAR LENS  IMPLANT, BILATERAL     COLONOSCOPY     DIRECT LARYNGOSCOPY Right 02/03/2022   Procedure: DIRECT LARYNGOSCOPY WITH BIOPSY OF RIGHT FALSE VOCAL CORD;  Surgeon: Osborn Coho, MD;  Location: [facility] OR;  Service: ENT;  Laterality: Right;   dislodged salava stone     FRACTURE SURGERY     HAND ARTHROPLASTY  1995   crushed left hand   INGUINAL LYMPH NODE BIOPSY Left 01/02/2018   Procedure: LEFT INGUINAL LYMPH NODE BIOPSY;  Surgeon: Emelia Loron, MD;  Location: [facility] OR;  Service: General;  Laterality: Left;   IR IMAGING GUIDED PORT INSERTION  01/15/2018   IR IMAGING GUIDED PORT  INSERTION  08/10/2022   IR REMOVAL TUN ACCESS W/ PORT W/O FL MOD SED  03/11/2019   MEDIASTINOSCOPY N/A 07/22/2022   Procedure: MEDIASTINOSCOPY;  Surgeon: Loreli Slot, MD;  Location: [facility] OR;  Service: Thoracic;  Laterality: N/A;   MICROLARYNGOSCOPY Left 01/17/2014   Procedure: MICROLARYNGOSCOPY WITH EXCISION OF THE BIOPSY OF LEFT VOCAL CORD LESION;  Surgeon: Serena Colonel, MD;  Location: [facility];  Service: ENT;  Laterality: Left;   MICROLARYNGOSCOPY N/A 09/16/2020   Procedure: MICROLARYNGOSCOPY with Biopsy of vocal cord lesion;  Surgeon: Osborn Coho, MD;  Location: [facility] OR;  Service: ENT;  Laterality: N/A;   ORIF FOOT FRACTURE  2005   left   REFRACTIVE SURGERY Right    removed cloudiness in right eye after cataract removal        Home Medications    Prior to Admission medications   Medication Sig Start Date End Date Taking? Authorizing Provider  doxycycline (VIBRAMYCIN) 100 MG capsule Take 1 capsule (100 mg total) by mouth 2 (two) times daily. 12/11/22  Yes Tomi Bamberger, PA-C  acetaminophen (TYLENOL) 500 MG tablet Take 1,500 mg by mouth daily as needed for moderate pain.    [provider]  amLODipine (NORVASC) 10 MG tablet TAKE 1 TABLET BY MOUTH EVERY DAY Patient taking differently: Take 10 mg by mouth every evening. 02/19/20   Shade Flood, MD  aspirin EC 81 MG tablet Take 81 mg by mouth every evening. Swallow whole.    [provider]  b complex vitamins capsule Take 1 capsule by mouth daily.    [provider]  dexamethasone (DECADRON) 4 MG tablet Take 2 tablets (8mg ) by mouth daily starting the day after carboplatin for 3 days. Take with food Patient not taking: Reported on 11/08/2022 08/04/22   Johney Maine, MD  lidocaine (XYLOCAINE) 2 % solution Patient: Mix 1part 2% viscous lidocaine, 1part H20. Swallow 10mL of diluted mixture, before meals and at bedtime, up to QID 12/09/22   Lonie Peak, MD  lidocaine-prilocaine  (EMLA) cream Apply to affected area once 08/04/22   Johney Maine, MD  lisinopril (ZESTRIL) 20 MG tablet TAKE 1 TABLET BY MOUTH EVERY DAY Patient taking differently: Take 20 mg by mouth every evening. 02/19/20   Shade Flood, MD  metFORMIN (GLUCOPHAGE) 500 MG tablet Take 500 mg by mouth 2 (two) times daily with a meal. 01/20/22   [provider]  Multiple Vitamins-Minerals (MULTI ADULT GUMMIES PO) Take 1 tablet by mouth in the morning. Centrum    [provider]  ondansetron (ZOFRAN) 8 MG tablet Take 1 tablet (8 mg total) by mouth every 8 (  eight) hours as needed for nausea or vomiting. Start on the third day after carboplatin. 08/04/22   Johney Maine, MD  oxyCODONE (OXY IR/ROXICODONE) 5 MG immediate release tablet Take 1 tablet (5 mg total) by mouth every 6 (six) hours as needed for moderate pain or breakthrough pain. 07/22/22   Loreli Slot, MD  prochlorperazine (COMPAZINE) 10 MG tablet Take 1 tablet (10 mg total) by mouth every 6 (six) hours as needed for nausea or vomiting. 08/04/22   Johney Maine, MD  tadalafil (CIALIS) 20 MG tablet Take 20 mg by mouth daily as needed for erectile dysfunction. 07/03/20   [provider]    Family History Family History  Problem Relation Age of Onset   Breast cancer Mother    Diabetes Father    Hypertension Father    Stroke Father    Mental illness Sister    Hypertension Daughter    Mental illness Daughter    Hypertension Brother    Colon cancer Brother 61       passed away Dec 15, 2018   Breast cancer Sister    Esophageal cancer Neg Hx    Colon polyps Neg Hx    Rectal cancer Neg Hx    Stomach cancer Neg Hx     Social History Social History   Tobacco Use   Smoking status: Former    Current packs/day: 0.00    Average packs/day: 0.5 packs/day for 36.0 years (18.0 ttl pk-yrs)    Types: Cigarettes    Start date: 09/28/1984    Quit date: 09/28/2020    Years since quitting: 2.2   Smokeless  tobacco: Never   Tobacco comments:    he denies smoking in about 2 weeks 07/13/18  Vaping Use   Vaping status: Never Used  Substance Use Topics   Alcohol use: Yes    Alcohol/week: 6.0 standard drinks of alcohol    Types: 6 Cans of beer per week    Comment: weekends   Drug use: Yes    Types: Marijuana, Cocaine    Comment: reports cocaine usage ~2X/ month; last use 07/2022     Allergies   Bee venom, Antifungal [miconazole nitrate], and Zolpidem tartrate er   Review of Systems Review of Systems  Constitutional:  Negative for chills and fever.  HENT:  Negative for congestion and trouble swallowing.   Eyes:  Negative for discharge and redness.  Respiratory:  Negative for shortness of breath.   Gastrointestinal:  Negative for nausea and vomiting.  Skin:  Positive for color change.  Neurological:  Negative for numbness.     Physical Exam Triage Vital Signs ED Triage Vitals [12/11/22 1345]  Encounter Vitals Group     BP 114/72     Systolic BP Percentile      Diastolic BP Percentile      Pulse Rate 84     Resp 18     Temp 98.2 F (36.8 C)     Temp Source Oral     SpO2 94 %     Weight      Height      Head Circumference      Peak Flow      Pain Score 0     Pain Loc      Pain Education      Exclude from Growth Chart    No data found.  Updated Vital Signs BP 114/72 (BP Location: Left Arm)   Pulse 84   Temp 98.2 F (36.8 C) (  Oral)   Resp 18   SpO2 94%      Physical Exam Vitals and nursing note reviewed.  Constitutional:      General: He is not in acute distress.    Appearance: Normal appearance. He is not ill-appearing.  HENT:     Head: Normocephalic and atraumatic.     Ears:     Comments: Swelling appreciated to left auricle Eyes:     Conjunctiva/sclera: Conjunctivae normal.  Cardiovascular:     Rate and Rhythm: Normal rate.  Pulmonary:     Effort: Pulmonary effort is normal.  Skin:    Comments: Mild swelling and erythema appreciated to left anterior  knee where sting occurred per patient  Neurological:     Mental Status: He is alert.  Psychiatric:        Mood and Affect: Mood normal.        Behavior: Behavior normal.        Thought Content: Thought content normal.      UC Treatments / Results  Labs (all labs ordered are listed, but only abnormal results are displayed) Labs Reviewed - No data to display  EKG   Radiology No results found.  Procedures Procedures (including critical care time)  Medications Ordered in UC Medications - No data to display  Initial Impression / Assessment and Plan / UC Course  I have reviewed the triage vital signs and the nursing notes.  Pertinent labs & imaging results that were available during my care of the patient were reviewed by me and considered in my medical decision making (see chart for details).    Given delayed reaction will treat to cover possible infection with antibiotic therapy.  Advised to follow-up with radiation specialist regarding treatment with antibiotics.  Encouraged follow-up if no gradual improvement or with any further concerns.  Final Clinical Impressions(s) / UC Diagnoses   Final diagnoses:  Bee sting, accidental or unintentional, initial encounter   Discharge Instructions   None    ED Prescriptions     Medication Sig Dispense Auth. Provider   doxycycline (VIBRAMYCIN) 100 MG capsule Take 1 capsule (100 mg total) by mouth 2 (two) times daily. 20 capsule Tomi Bamberger, PA-C      PDMP not reviewed this encounter.   Tomi Bamberger, PA-C 12/11/22 [phone]

## 2022-12-11 NOTE — ED Triage Notes (Signed)
Pt sts multiple yellow jacket stings 3 days ago; pt sts still having some pain and itching; pt sts left ear is swollen; pt had radiation treatment Friday as well

## 2022-12-12 ENCOUNTER — Ambulatory Visit
Admission: RE | Admit: 2022-12-12 | Discharge: 2022-12-12 | Disposition: A | Discharge status: Home / Self Care | Payer: Medicare HMO | Source: Ambulatory Visit | Attending: Radiation Oncology | Admitting: Radiation Oncology

## 2022-12-12 ENCOUNTER — Other Ambulatory Visit: Payer: Self-pay

## 2022-12-12 DIAGNOSIS — C801 Malignant (primary) neoplasm, unspecified: Secondary | ICD-10-CM | POA: Diagnosis not present

## 2022-12-12 DIAGNOSIS — C320 Malignant neoplasm of glottis: Secondary | ICD-10-CM | POA: Diagnosis not present

## 2022-12-12 DIAGNOSIS — C770 Secondary and unspecified malignant neoplasm of lymph nodes of head, face and neck: Secondary | ICD-10-CM | POA: Diagnosis not present

## 2022-12-12 DIAGNOSIS — Z79620 Long term (current) use of immunosuppressive biologic: Secondary | ICD-10-CM | POA: Diagnosis not present

## 2022-12-12 DIAGNOSIS — Z510 Encounter for antineoplastic radiation therapy: Secondary | ICD-10-CM | POA: Diagnosis not present

## 2022-12-12 DIAGNOSIS — C771 Secondary and unspecified malignant neoplasm of intrathoracic lymph nodes: Secondary | ICD-10-CM | POA: Diagnosis not present

## 2022-12-12 LAB — RAD ONC ARIA SESSION SUMMARY
Course Elapsed Days: 13
Plan Fractions Treated to Date: 8
Plan Prescribed Dose Per Fraction: 2 Gy
Plan Total Fractions Prescribed: 30
Plan Total Prescribed Dose: 60 Gy
Reference Point Dosage Given to Date: 16 Gy
Reference Point Session Dosage Given: 2 Gy
Session Number: 8

## 2022-12-13 ENCOUNTER — Other Ambulatory Visit: Payer: Self-pay

## 2022-12-13 ENCOUNTER — Ambulatory Visit
Admission: RE | Admit: 2022-12-13 | Discharge: 2022-12-13 | Disposition: A | Discharge status: Home / Self Care | Payer: Medicare HMO | Source: Ambulatory Visit | Attending: Radiation Oncology | Admitting: Radiation Oncology

## 2022-12-13 DIAGNOSIS — Z510 Encounter for antineoplastic radiation therapy: Secondary | ICD-10-CM | POA: Diagnosis not present

## 2022-12-13 DIAGNOSIS — Z79620 Long term (current) use of immunosuppressive biologic: Secondary | ICD-10-CM | POA: Diagnosis not present

## 2022-12-13 DIAGNOSIS — C801 Malignant (primary) neoplasm, unspecified: Secondary | ICD-10-CM | POA: Diagnosis not present

## 2022-12-13 DIAGNOSIS — C770 Secondary and unspecified malignant neoplasm of lymph nodes of head, face and neck: Secondary | ICD-10-CM | POA: Diagnosis not present

## 2022-12-13 DIAGNOSIS — C771 Secondary and unspecified malignant neoplasm of intrathoracic lymph nodes: Secondary | ICD-10-CM | POA: Diagnosis not present

## 2022-12-13 DIAGNOSIS — C320 Malignant neoplasm of glottis: Secondary | ICD-10-CM | POA: Diagnosis not present

## 2022-12-13 LAB — RAD ONC ARIA SESSION SUMMARY
Course Elapsed Days: 14
Plan Fractions Treated to Date: 9
Plan Prescribed Dose Per Fraction: 2 Gy
Plan Total Fractions Prescribed: 30
Plan Total Prescribed Dose: 60 Gy
Reference Point Dosage Given to Date: 18 Gy
Reference Point Session Dosage Given: 2 Gy
Session Number: 9

## 2022-12-14 ENCOUNTER — Ambulatory Visit
Admission: RE | Admit: 2022-12-14 | Discharge: 2022-12-14 | Disposition: A | Discharge status: Home / Self Care | Payer: Medicare HMO | Source: Ambulatory Visit | Attending: Radiation Oncology | Admitting: Radiation Oncology

## 2022-12-14 ENCOUNTER — Other Ambulatory Visit: Payer: Self-pay

## 2022-12-14 DIAGNOSIS — C771 Secondary and unspecified malignant neoplasm of intrathoracic lymph nodes: Secondary | ICD-10-CM | POA: Diagnosis not present

## 2022-12-14 DIAGNOSIS — C801 Malignant (primary) neoplasm, unspecified: Secondary | ICD-10-CM | POA: Diagnosis not present

## 2022-12-14 DIAGNOSIS — Z79620 Long term (current) use of immunosuppressive biologic: Secondary | ICD-10-CM | POA: Diagnosis not present

## 2022-12-14 DIAGNOSIS — C770 Secondary and unspecified malignant neoplasm of lymph nodes of head, face and neck: Secondary | ICD-10-CM | POA: Diagnosis not present

## 2022-12-14 DIAGNOSIS — C320 Malignant neoplasm of glottis: Secondary | ICD-10-CM | POA: Diagnosis not present

## 2022-12-14 DIAGNOSIS — Z510 Encounter for antineoplastic radiation therapy: Secondary | ICD-10-CM | POA: Diagnosis not present

## 2022-12-14 LAB — RAD ONC ARIA SESSION SUMMARY
Course Elapsed Days: 15
Plan Fractions Treated to Date: 10
Plan Prescribed Dose Per Fraction: 2 Gy
Plan Total Fractions Prescribed: 30
Plan Total Prescribed Dose: 60 Gy
Reference Point Dosage Given to Date: 20 Gy
Reference Point Session Dosage Given: 2 Gy
Session Number: 10

## 2022-12-15 ENCOUNTER — Ambulatory Visit: Admission: RE | Admit: 2022-12-15 | Payer: Medicare HMO | Source: Ambulatory Visit

## 2022-12-15 ENCOUNTER — Encounter: Payer: Medicare HMO | Admitting: Nutrition

## 2022-12-16 ENCOUNTER — Ambulatory Visit
Admission: RE | Admit: 2022-12-16 | Discharge: 2022-12-16 | Disposition: A | Discharge status: Home / Self Care | Payer: Medicare HMO | Source: Ambulatory Visit | Attending: Radiation Oncology | Admitting: Radiation Oncology

## 2022-12-16 ENCOUNTER — Other Ambulatory Visit: Payer: Self-pay

## 2022-12-16 DIAGNOSIS — C770 Secondary and unspecified malignant neoplasm of lymph nodes of head, face and neck: Secondary | ICD-10-CM | POA: Diagnosis not present

## 2022-12-16 DIAGNOSIS — C771 Secondary and unspecified malignant neoplasm of intrathoracic lymph nodes: Secondary | ICD-10-CM | POA: Diagnosis not present

## 2022-12-16 DIAGNOSIS — Z79620 Long term (current) use of immunosuppressive biologic: Secondary | ICD-10-CM | POA: Diagnosis not present

## 2022-12-16 DIAGNOSIS — C801 Malignant (primary) neoplasm, unspecified: Secondary | ICD-10-CM | POA: Diagnosis not present

## 2022-12-16 DIAGNOSIS — Z510 Encounter for antineoplastic radiation therapy: Secondary | ICD-10-CM | POA: Diagnosis not present

## 2022-12-16 DIAGNOSIS — C320 Malignant neoplasm of glottis: Secondary | ICD-10-CM | POA: Diagnosis not present

## 2022-12-16 LAB — RAD ONC ARIA SESSION SUMMARY
Course Elapsed Days: 17
Plan Fractions Treated to Date: 11
Plan Prescribed Dose Per Fraction: 2 Gy
Plan Total Fractions Prescribed: 30
Plan Total Prescribed Dose: 60 Gy
Reference Point Dosage Given to Date: 22 Gy
Reference Point Session Dosage Given: 2 Gy
Session Number: 11

## 2022-12-19 ENCOUNTER — Inpatient Hospital Stay: Payer: Medicare HMO

## 2022-12-19 ENCOUNTER — Ambulatory Visit
Admission: RE | Admit: 2022-12-19 | Discharge: 2022-12-19 | Disposition: A | Discharge status: Home / Self Care | Payer: Medicare HMO | Source: Ambulatory Visit | Attending: Radiation Oncology | Admitting: Radiation Oncology

## 2022-12-19 ENCOUNTER — Inpatient Hospital Stay: Payer: Medicare HMO | Admitting: Hematology

## 2022-12-19 ENCOUNTER — Other Ambulatory Visit: Payer: Self-pay

## 2022-12-19 VITALS — BP 119/75 | HR 92 | Temp 98.7°F | Resp 18 | Wt 273.8 lb

## 2022-12-19 DIAGNOSIS — Z87891 Personal history of nicotine dependence: Secondary | ICD-10-CM | POA: Insufficient documentation

## 2022-12-19 DIAGNOSIS — Z7189 Other specified counseling: Secondary | ICD-10-CM

## 2022-12-19 DIAGNOSIS — Z5112 Encounter for antineoplastic immunotherapy: Secondary | ICD-10-CM | POA: Insufficient documentation

## 2022-12-19 DIAGNOSIS — C760 Malignant neoplasm of head, face and neck: Secondary | ICD-10-CM

## 2022-12-19 DIAGNOSIS — Z79899 Other long term (current) drug therapy: Secondary | ICD-10-CM | POA: Insufficient documentation

## 2022-12-19 DIAGNOSIS — Z7982 Long term (current) use of aspirin: Secondary | ICD-10-CM | POA: Insufficient documentation

## 2022-12-19 DIAGNOSIS — Z79620 Long term (current) use of immunosuppressive biologic: Secondary | ICD-10-CM | POA: Diagnosis not present

## 2022-12-19 DIAGNOSIS — C770 Secondary and unspecified malignant neoplasm of lymph nodes of head, face and neck: Secondary | ICD-10-CM | POA: Diagnosis not present

## 2022-12-19 DIAGNOSIS — Z8572 Personal history of non-Hodgkin lymphomas: Secondary | ICD-10-CM | POA: Insufficient documentation

## 2022-12-19 DIAGNOSIS — C801 Malignant (primary) neoplasm, unspecified: Secondary | ICD-10-CM | POA: Diagnosis not present

## 2022-12-19 DIAGNOSIS — Z510 Encounter for antineoplastic radiation therapy: Secondary | ICD-10-CM | POA: Diagnosis not present

## 2022-12-19 DIAGNOSIS — Z7952 Long term (current) use of systemic steroids: Secondary | ICD-10-CM | POA: Insufficient documentation

## 2022-12-19 DIAGNOSIS — C320 Malignant neoplasm of glottis: Secondary | ICD-10-CM | POA: Insufficient documentation

## 2022-12-19 DIAGNOSIS — C771 Secondary and unspecified malignant neoplasm of intrathoracic lymph nodes: Secondary | ICD-10-CM | POA: Diagnosis not present

## 2022-12-19 DIAGNOSIS — Z95828 Presence of other vascular implants and grafts: Secondary | ICD-10-CM

## 2022-12-19 LAB — CBC WITH DIFFERENTIAL (CANCER CENTER ONLY)
Abs Immature Granulocytes: 0.01 10*3/uL (ref 0.00–0.07)
Basophils Absolute: 0 10*3/uL (ref 0.0–0.1)
Basophils Relative: 1 %
Eosinophils Absolute: 0.2 10*3/uL (ref 0.0–0.5)
Eosinophils Relative: 3 %
HCT: 28 % — ABNORMAL LOW (ref 39.0–52.0)
Hemoglobin: 9.7 g/dL — ABNORMAL LOW (ref 13.0–17.0)
Immature Granulocytes: 0 %
Lymphocytes Relative: 20 %
Lymphs Abs: 1.1 10*3/uL (ref 0.7–4.0)
MCH: 31.1 pg (ref 26.0–34.0)
MCHC: 34.6 g/dL (ref 30.0–36.0)
MCV: 89.7 fL (ref 80.0–100.0)
Monocytes Absolute: 0.7 10*3/uL (ref 0.1–1.0)
Monocytes Relative: 12 %
Neutro Abs: 3.5 10*3/uL (ref 1.7–7.7)
Neutrophils Relative %: 64 %
Platelet Count: 348 10*3/uL (ref 150–400)
RBC: 3.12 MIL/uL — ABNORMAL LOW (ref 4.22–5.81)
RDW: 13.1 % (ref 11.5–15.5)
WBC Count: 5.5 10*3/uL (ref 4.0–10.5)
nRBC: 0 % (ref 0.0–0.2)

## 2022-12-19 LAB — RAD ONC ARIA SESSION SUMMARY
Course Elapsed Days: 20
Plan Fractions Treated to Date: 12
Plan Prescribed Dose Per Fraction: 2 Gy
Plan Total Fractions Prescribed: 30
Plan Total Prescribed Dose: 60 Gy
Reference Point Dosage Given to Date: 24 Gy
Reference Point Session Dosage Given: 2 Gy
Session Number: 12

## 2022-12-19 LAB — CMP (CANCER CENTER ONLY)
ALT: 7 U/L (ref 0–44)
AST: 11 U/L — ABNORMAL LOW (ref 15–41)
Albumin: 4 g/dL (ref 3.5–5.0)
Alkaline Phosphatase: 66 U/L (ref 38–126)
Anion gap: 8 (ref 5–15)
BUN: 17 mg/dL (ref 8–23)
CO2: 29 mmol/L (ref 22–32)
Calcium: 9.6 mg/dL (ref 8.9–10.3)
Chloride: 99 mmol/L (ref 98–111)
Creatinine: 1.35 mg/dL — ABNORMAL HIGH (ref 0.61–1.24)
GFR, Estimated: 58 mL/min — ABNORMAL LOW (ref >60)
Glucose, Bld: 134 mg/dL — ABNORMAL HIGH (ref 70–99)
Potassium: 4.5 mmol/L (ref 3.5–5.1)
Sodium: 136 mmol/L (ref 135–145)
Total Bilirubin: 0.4 mg/dL (ref 0.3–1.2)
Total Protein: 7.8 g/dL (ref 6.5–8.1)

## 2022-12-19 LAB — TSH: TSH: 0.875 u[IU]/mL (ref 0.350–4.500)

## 2022-12-19 MED ORDER — MAGIC MOUTHWASH W/LIDOCAINE: 5.0000 mL | Freq: Four times a day (QID) | ORAL | 0 refills | Status: DC | PRN

## 2022-12-19 MED ORDER — SODIUM CHLORIDE 0.9 % IV SOLN: INTRAVENOUS | Status: DC

## 2022-12-19 MED ORDER — SODIUM CHLORIDE 0.9% FLUSH
10.0000 mL | Freq: Once | INTRAVENOUS | Status: AC
  Administered 2022-12-19: 10 mL

## 2022-12-19 MED ORDER — SODIUM CHLORIDE 0.9 % IV SOLN
200.0000 mg | Freq: Once | INTRAVENOUS | Status: AC
  Administered 2022-12-19: 200 mg via INTRAVENOUS
  Filled 2022-12-19: qty 200

## 2022-12-19 MED ORDER — HEPARIN SOD (PORK) LOCK FLUSH 100 UNIT/ML IV SOLN
500.0000 [IU] | Freq: Once | INTRAVENOUS | Status: AC | PRN
  Administered 2022-12-19: 500 [IU]

## 2022-12-19 MED ORDER — SODIUM CHLORIDE 0.9 % IV SOLN: Freq: Once | INTRAVENOUS | Status: AC

## 2022-12-19 MED ORDER — SODIUM CHLORIDE 0.9% FLUSH
10.0000 mL | INTRAVENOUS | Status: DC | PRN
  Administered 2022-12-19: 10 mL

## 2022-12-19 MED ORDER — SUCRALFATE 1 G PO TABS
1.0000 g | ORAL_TABLET | Freq: Two times a day (BID) | ORAL | 0 refills | Status: DC
Start: 2022-12-19 — End: 2023-06-12

## 2022-12-19 NOTE — Progress Notes (Signed)
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  HISTORY OF PRESENTING ILLNESS:   Jesus [name] 67 y.o. male is here because of left lower extremity edema and lymphadenopathy.  The patient was seen in the emergency room this past Friday for the same issue.  A CT of the abdomen and pelvis was performed showing bulky left inguinal, left hemipelvic, and retroperitoneal adenopathy.  He was referred to [place] from the emergency room for further evaluation.  Doppler ultrasound of the left lower extremity was performed and was negative for DVT. Patient reports that he has been having left lower extremity edema in his left groin and left leg for approximately 1 month.  He states that the swelling in the left groin started to get better but then worsened.  The left lower extremity edema has slowly worsened over time.  Patient denies having fevers and chills.  He reports that he does have night sweats at times.  He reported having headaches approximate 1 [place]  He thinks his headaches are related to not having his blood pressure medication.  His headaches have now resolved.  He denies visual changes.  The patient denies chest pain, shortness of breath and cough.  No nausea, vomiting, constipation, diarrhea.  Denies abdominal pain.  The patient denies recent weight loss and has actually  gained weight recently.  Patient denies epistaxis, bleeding gums, hemoptysis, hematuria, but occasionally, and melena.  He reports increased urinary frequency over the past month but no dysuria.  The patient is here for evaluation and discussion of his recent CT and lab findings.  Interval History:  Jesus [name] 67 y.o. male who is here for continued evaluation and management of newly noted metastatic laryngeal squamous cell carcinoma. He is here for cycle 7 day 1 of Pembrolizumab.  Patient was last seen by me on 11/08/2022 and he complained of swallowing problem while eating food, but was doing well overall.   Patient notes that he has been doing well overall since our last visit. He complains of appetite loss and weight loss due to throat pain when eating. He has lost around 20 lbs since 11/28/2022. He is drinking protein milkshake everyday. Patient has been taking liquid tylenol which helps his throat pain.   Patient denies any new infection issues, fever, chills, night sweats, new skin lumps/bumps, thrust, chest pain, back pain, abdominal pain, or leg swelling.   His last radiation treatment is scheduled on September 5th.  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- last radiation 06-2018   Cataract    removed both eyes with l;ens implants    COPD (chronic obstructive pulmonary disease) (HCC)  Family history of colon cancer    in his brother- dx'd age 87    History of chemotherapy    last 06-01-2018   History of kidney stones    History of radiation therapy    last radiation 06-2018   Hyperlipidemia    currently under control   Hypertension    Irregular heart beats    Lymphadenopathy    Pain, lower leg    Bilateral   Peripheral arterial disease (HCC)     Pre-diabetes    Red-green color blindness    RLS (restless legs syndrome)    Snores    Wears glasses     SURGICAL HISTORY: Past Surgical History:  Procedure Laterality Date   CATARACT EXTRACTION W/ INTRAOCULAR LENS  IMPLANT, BILATERAL     COLONOSCOPY     DIRECT LARYNGOSCOPY Right 02/03/2022   Procedure: DIRECT LARYNGOSCOPY WITH BIOPSY OF RIGHT FALSE VOCAL CORD;  Surgeon: Osborn Coho, MD;  Location: [facility] OR;  Service: ENT;  Laterality: Right;   dislodged salava stone     FRACTURE SURGERY     HAND ARTHROPLASTY  1995   crushed left hand   INGUINAL LYMPH NODE BIOPSY Left 01/02/2018   Procedure: LEFT INGUINAL LYMPH NODE BIOPSY;  Surgeon: Emelia Loron, MD;  Location: [facility] OR;  Service: General;  Laterality: Left;   IR IMAGING GUIDED PORT INSERTION  01/15/2018   IR IMAGING GUIDED PORT INSERTION  08/10/2022   IR REMOVAL TUN ACCESS W/ PORT W/O FL MOD SED  03/11/2019   MEDIASTINOSCOPY N/A 07/22/2022   Procedure: MEDIASTINOSCOPY;  Surgeon: Loreli Slot, MD;  Location: [facility] OR;  Service: Thoracic;  Laterality: N/A;   MICROLARYNGOSCOPY Left 01/17/2014   Procedure: MICROLARYNGOSCOPY WITH EXCISION OF THE BIOPSY OF LEFT VOCAL CORD LESION;  Surgeon: Serena Colonel, MD;  Location: [facility];  Service: ENT;  Laterality: Left;   MICROLARYNGOSCOPY N/A 09/16/2020   Procedure: MICROLARYNGOSCOPY with Biopsy of vocal cord lesion;  Surgeon: Osborn Coho, MD;  Location: [facility] OR;  Service: ENT;  Laterality: N/A;   ORIF FOOT FRACTURE  2005   left   REFRACTIVE SURGERY Right    removed cloudiness in right eye after cataract removal     SOCIAL HISTORY: Social History   Socioeconomic History   Marital status: Divorced    Spouse name: Not on file   Number of children: 3   Years of education: Not on file   Highest education level: Not on file  Occupational History   Not on file  Tobacco Use   Smoking status: Former    Current packs/day: 0.00    Average packs/day: 0.5 packs/day for  36.0 years (18.0 ttl pk-yrs)    Types: Cigarettes    Start date: 09/28/1984    Quit date: 09/28/2020    Years since quitting: 2.2   Smokeless tobacco: Never   Tobacco comments:    he denies smoking in about 2 weeks 07/13/18  Vaping Use   Vaping status: Never Used  Substance and Sexual Activity   Alcohol use: Yes    Alcohol/week: 6.0 standard drinks of alcohol    Types: 6 Cans of beer per week    Comment: weekends   Drug use: Yes    Types: Marijuana, Cocaine    Comment: reports cocaine usage ~2X/ month; last use 07/2022   Sexual activity: Not on file  Other Topics Concern   Not on file  Social History Narrative   Not on file   Social Determinants of Health  Financial Resource Strain: Low Risk  (10/06/2020)   Overall Financial Resource Strain (CARDIA)    Difficulty of Paying Living Expenses: Not very hard  Food Insecurity: No Food Insecurity (11/09/2022)   Hunger Vital Sign    Worried About Running Out of Food in the Last Year: Never true    Ran Out of Food in the Last Year: Never true  Transportation Needs: No Transportation Needs (11/09/2022)   PRAPARE - Administrator, Civil Service (Medical): No    Lack of Transportation (Non-Medical): No  Physical Activity: Not on file  Stress: Not on file  Social Connections: Moderately Integrated (10/06/2020)   Social Connection and Isolation Panel [NHANES]    Frequency of Communication with Friends and Family: Three times a week    Frequency of Social Gatherings with Friends and Family: Three times a week    Attends Religious Services: 1 to 4 times per year    Active Member of Clubs or Organizations: Yes    Attends Banker Meetings: 1 to 4 times per year    Marital Status: Divorced  Intimate Partner Violence: Not At Risk (11/09/2022)   Humiliation, Afraid, Rape, and Kick questionnaire    Fear of Current or Ex-Partner: No    Emotionally Abused: No    Physically Abused: No    Sexually Abused: No    FAMILY  HISTORY: Family History  Problem Relation Age of Onset   Breast cancer Mother    Diabetes Father    Hypertension Father    Stroke Father    Mental illness Sister    Hypertension Daughter    Mental illness Daughter    Hypertension Brother    Colon cancer Brother 5       passed away 25-Dec-2018   Breast cancer Sister    Esophageal cancer Neg Hx    Colon polyps Neg Hx    Rectal cancer Neg Hx    Stomach cancer Neg Hx     ALLERGIES:  is allergic to bee venom, antifungal [miconazole nitrate], and zolpidem tartrate er.  MEDICATIONS:  Current Outpatient Medications  Medication Sig Dispense Refill   acetaminophen (TYLENOL) 500 MG tablet Take 1,500 mg by mouth daily as needed for moderate pain.     amLODipine (NORVASC) 10 MG tablet TAKE 1 TABLET BY MOUTH EVERY DAY (Patient taking differently: Take 10 mg by mouth every evening.) 30 tablet 0   aspirin EC 81 MG tablet Take 81 mg by mouth every evening. Swallow whole.     b complex vitamins capsule Take 1 capsule by mouth daily.     dexamethasone (DECADRON) 4 MG tablet Take 2 tablets (8mg ) by mouth daily starting the day after carboplatin for 3 days. Take with food (Patient not taking: Reported on 11/08/2022) 30 tablet 1   doxycycline (VIBRAMYCIN) 100 MG capsule Take 1 capsule (100 mg total) by mouth 2 (two) times daily. 20 capsule 0   lidocaine (XYLOCAINE) 2 % solution Patient: Mix 1part 2% viscous lidocaine, 1part H20. Swallow 10mL of diluted mixture, before meals and at bedtime, up to QID 200 mL 2   lidocaine-prilocaine (EMLA) cream Apply to affected area once 30 g 3   lisinopril (ZESTRIL) 20 MG tablet TAKE 1 TABLET BY MOUTH EVERY DAY (Patient taking differently: Take 20 mg by mouth every evening.) 30 tablet 0   metFORMIN (GLUCOPHAGE) 500 MG tablet Take 500 mg by mouth 2 (two) times daily with a meal.     Multiple Vitamins-Minerals (MULTI ADULT  GUMMIES PO) Take 1 tablet by mouth in the morning. Centrum     ondansetron (ZOFRAN) 8 MG tablet  Take 1 tablet (8 mg total) by mouth every 8 (eight) hours as needed for nausea or vomiting. Start on the third day after carboplatin. 30 tablet 1   oxyCODONE (OXY IR/ROXICODONE) 5 MG immediate release tablet Take 1 tablet (5 mg total) by mouth every 6 (six) hours as needed for moderate pain or breakthrough pain. 20 tablet 0   prochlorperazine (COMPAZINE) 10 MG tablet Take 1 tablet (10 mg total) by mouth every 6 (six) hours as needed for nausea or vomiting. 30 tablet 1   tadalafil (CIALIS) 20 MG tablet Take 20 mg by mouth daily as needed for erectile dysfunction.     No current facility-administered medications for this visit.    REVIEW OF SYSTEMS:   10 Point review of Systems was done is negative except as noted above.  PHYSICAL EXAMINATION: .There were no vitals taken for this visit. Marland Kitchen GENERAL:alert, in no acute distress and comfortable SKIN: no acute rashes, no significant lesions EYES: conjunctiva are pink and non-injected, sclera anicteric OROPHARYNX: MMM, no exudates, no oropharyngeal erythema or ulceration NECK: supple, no JVD LYMPH:  no palpable lymphadenopathy in the cervical, axillary or inguinal regions LUNGS: clear to auscultation b/l with normal respiratory effort HEART: regular rate & rhythm ABDOMEN:  normoactive bowel sounds , non tender, not distended. Extremity: no pedal edema PSYCH: alert & oriented x 3 with fluent speech NEURO: no focal motor/sensory deficits   LABORATORY DATA:   I have reviewed the data as listed    Latest Ref Rng & Units 12/19/2022   11:37 AM 11/28/2022   12:13 PM 11/08/2022   11:53 AM  CBC  WBC 4.0 - 10.5 K/uL 5.5  6.7  5.1   Hemoglobin 13.0 - 17.0 g/dL 9.7  40.9  81.1   Hematocrit 39.0 - 52.0 % 28.0  31.9  32.8   Platelets 150 - 400 K/uL 348  362  331    CBC    Component Value Date/Time   WBC 6.7 11/28/2022 1213   WBC 6.5 07/19/2022 1400   RBC 3.36 (L) 11/28/2022 1213   HGB 10.6 (L) 11/28/2022 1213   HCT 31.9 (L) 11/28/2022 1213    PLT 362 11/28/2022 1213   MCV 94.9 11/28/2022 1213   MCV 87.3 12/22/2017 1518   MCH 31.5 11/28/2022 1213   MCHC 33.2 11/28/2022 1213   RDW 13.8 11/28/2022 1213   LYMPHSABS 3.1 11/28/2022 1213   MONOABS 0.6 11/28/2022 1213   EOSABS 0.1 11/28/2022 1213   BASOSABS 0.0 11/28/2022 1213       Latest Ref Rng & Units 12/19/2022   11:37 AM 11/28/2022   12:13 PM 11/08/2022   11:53 AM  CMP  Glucose 70 - 99 mg/dL 914  782  956   BUN 8 - 23 mg/dL 17  12  8    Creatinine 0.61 - 1.24 mg/dL 2.13  0.86  5.78   Sodium 135 - 145 mmol/L 136  139  139   Potassium 3.5 - 5.1 mmol/L 4.5  4.1  4.0   Chloride 98 - 111 mmol/L 99  105  105   CO2 22 - 32 mmol/L 29  28  28    Calcium 8.9 - 10.3 mg/dL 9.6  9.5  9.7   Total Protein 6.5 - 8.1 g/dL 7.8  7.0  6.8   Total Bilirubin 0.3 - 1.2 mg/dL 0.4  0.5  0.4  Alkaline Phos 38 - 126 U/L 66  66  71   AST 15 - 41 U/L 11  11  12    ALT 0 - 44 U/L 7  7  8     . Lab Results  Component Value Date   LDH 168 05/18/2022   Tsh 1.42 (05/21/2021)   01/02/18 Left Inguinal LN Bx:   12/26/17 Tissue Flow Cytometry:   12/26/17 Inguinal Core biopsy:      RADIOGRAPHIC STUDIES: I have personally reviewed the radiological images as listed and agreed with the findings in the report. No results found.  ASSESSMENT & PLAN:   This is a pleasant 67 y.o. 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   CT of the abdomen and pelvis performed on 12/22/2017 showed bulky left inguinal, left hemipelvic, and  retroperitoneal adenopathy.     01/02/18 Left inguinal LN Biopsy revealed T-Cell/histocyte rich Large B-Cell Lymphoma   12/27/17 ECHO revealed LV EF of 55-60%    01/05/18 PET/CT revealed Massively enlarged pelvic lymph  nodes intense metabolic activity consistent lymphoma. 2. Additional hypermetabolic lymph nodes in the porta hepatis and retroperitoneum LEFT aorta. 3. Solitary hypermetabolic mediastinal lymph node in the upper LEFT Mediastinum. 4. 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 5. Normal spleen.     HIV non reactive on 12/22/2017.  Hep C and hep B serology negative.   03/14/18 PET/CT revealed PET-CT findings suggest an excellent response to chemotherapy. The abdominal lymphadenopathy has near completely resolved and demonstrates a near complete metabolic response. The pelvic and inguinal adenopathy has significantly decreased in size and the metabolic activity has significantly decreased. 2. Diffuse marrow activity likely due to chemotherapy and or marrow stimulating drugs. I do not see any discrete persistent lesions.    04/17/18 CT Head revealed Subtle mesial caudothalamic hypodensities may be artifact though, the could reflect encephalitis or Wernicke's encephalopathy. Consider MRI of the head with and without contrast. 2. Mild chronic small vessel ischemic changes    S/p 6 cycles of EPOCH-R completed on 06/01/18  06/28/18 PET/CT revealed Continued good response to treatment. No residual measurable or hypermetabolic abdominal lymphadenopathy and no recurrent osseous disease. 2. Interval decrease in size of the left operator region lymph node and also the left inguinal lymph node. However, the both have small foci of slightly increased hypermetabolism which bears surveillance. 3. No new or progressive lymphadenopathy in the neck, chest, abdomen or pelvis.  S/p 39.6 Gy in 22 fractions between 07/25/18 and 08/23/18  02/13/2019 CT C/A/P ([id number]) ([id number]) revealed "1. Response to therapy of pelvic adenopathy compared to the PET of 06/28/2018. 2. No new or progressive disease. 3. Mild prostatomegaly. 4. Pelvic cortical thickening and trabeculation are similar,  likely related to Paget's disease. 5. Hepatomegaly."   2) left lower extremity swelling- now resolved  Doppler ultrasound for DVT was negative in the left lower extremity.   Likely from venous compression +/- lymphatic obstruction from bulky left inguinal, left hemipelvic, and  retroperitoneal adenopathy.    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  6) Radiation mucositis PLAN: -Discussed lab results from today, 12/19/2022, with the patient. CBC shows decreased hemoglobin at 9.7 g/dL and decreased hematocrit at 28.0%. CMP shows elevated glucose level at 134 and elevated creatinine at 1.35. -will precribe magic mouthwash with lidocaine and pain medication.  -Recommend to stay well hydrated and eat as much as possible.  -Patient has been tolerating his treatment well without any new or severe toxicities.  -Patient will continue to receive immunotherapy (Pembrolizumab) without any dose modification.  -continue f/u with rad onc to complete palliative EBRT  FOLLOW-UP: Please schedule next 2 doses of pembrolizumab as per integrated scheduling with labs and MD visits.   The total time spent in the appointment was 30 minutes* .  All of the patient's questions were answered with apparent satisfaction. The patient knows to call the clinic with any problems, questions or concerns.   Wyvonnia Lora MD MS AAHIVMS [facility] Hematology/Oncology Physician [facility]  .*Total Encounter Time as defined by the Centers for Medicare and Medicaid Services includes, in addition to the face-to-face time of a patient visit  (documented in the note above) non-face-to-face time: obtaining and reviewing outside history, ordering and reviewing medications, tests or procedures, care coordination (communications with other health care professionals or caregivers) and documentation in the medical record.   I, Ok Edwards, acting as a Neurosurgeon for Wyvonnia Lora, MD.,have documented all relevant documentation on the behalf of Wyvonnia Lora, MD,as directed by  Wyvonnia Lora, MD while in the presence of Wyvonnia Lora, MD.  .I have reviewed the above documentation for accuracy and completeness, and I agree with the above. Johney Maine MD

## 2022-12-19 NOTE — Patient Instructions (Signed)
[facility]  Discharge Instructions: Thank you for choosing [facility] to provide your oncology and hematology care.   If you have a lab appointment with the [facility], please go directly to the [facility] and check in at the registration area.   Wear comfortable clothing and clothing appropriate for easy access to any Portacath or PICC line.   We strive to give you quality time with your provider. You may need to reschedule your appointment if you arrive late (15 or more minutes).  Arriving late affects you and other patients whose appointments are after yours.  Also, if you miss three or more appointments without notifying the office, you may be dismissed from the clinic at the provider's discretion.      For prescription refill requests, have your pharmacy contact our office and allow 72 hours for refills to be completed.    Today you received the following chemotherapy and/or immunotherapy agents: Keytruda      To help prevent nausea and vomiting after your treatment, we encourage you to take your nausea medication as directed.  BELOW ARE SYMPTOMS THAT SHOULD BE REPORTED IMMEDIATELY: *FEVER GREATER THAN 100.4 F (38 C) OR HIGHER *CHILLS OR SWEATING *NAUSEA AND VOMITING THAT IS NOT CONTROLLED WITH YOUR NAUSEA MEDICATION *UNUSUAL SHORTNESS OF BREATH *UNUSUAL BRUISING OR BLEEDING *URINARY PROBLEMS (pain or burning when urinating, or frequent urination) *BOWEL PROBLEMS (unusual diarrhea, constipation, pain near the anus) TENDERNESS IN MOUTH AND THROAT WITH OR WITHOUT PRESENCE OF ULCERS (sore throat, sores in mouth, or a toothache) UNUSUAL RASH, SWELLING OR PAIN  UNUSUAL VAGINAL DISCHARGE OR ITCHING   Items with * indicate a potential emergency and should be followed up as soon as possible or go to the Emergency Department if any problems should occur.  Please show the CHEMOTHERAPY ALERT CARD or IMMUNOTHERAPY ALERT CARD at  check-in to the Emergency Department and triage nurse.  Should you have questions after your visit or need to cancel or reschedule your appointment, please contact [facility]  Dept: [phone]  and follow the prompts.  Office hours are 8:00 a.m. to 4:30 p.m. Monday - Friday. Please note that voicemails left after 4:00 p.m. may not be returned until the following business day.  We are closed weekends and major holidays. You have access to a nurse at all times for urgent questions. Please call the main number to the clinic Dept: [phone] and follow the prompts.   For any non-urgent questions, you may also contact your provider using MyChart. We now offer e-Visits for anyone 14 and older to request care online for non-urgent symptoms. For details visit [email or web address].   Also download the MyChart app! Go to the app store, search "MyChart", open the app, select [facility], and log in with your MyChart username and password.

## 2022-12-20 ENCOUNTER — Other Ambulatory Visit: Payer: Self-pay

## 2022-12-20 ENCOUNTER — Ambulatory Visit
Admission: RE | Admit: 2022-12-20 | Discharge: 2022-12-20 | Disposition: A | Discharge status: Home / Self Care | Payer: Medicare HMO | Source: Ambulatory Visit | Attending: Radiation Oncology | Admitting: Radiation Oncology

## 2022-12-20 ENCOUNTER — Inpatient Hospital Stay: Payer: Medicare HMO | Admitting: Licensed Clinical Social Worker

## 2022-12-20 ENCOUNTER — Inpatient Hospital Stay: Payer: Medicare HMO | Admitting: Dietician

## 2022-12-20 DIAGNOSIS — Z510 Encounter for antineoplastic radiation therapy: Secondary | ICD-10-CM | POA: Diagnosis not present

## 2022-12-20 DIAGNOSIS — C801 Malignant (primary) neoplasm, unspecified: Secondary | ICD-10-CM | POA: Diagnosis not present

## 2022-12-20 DIAGNOSIS — C320 Malignant neoplasm of glottis: Secondary | ICD-10-CM | POA: Diagnosis not present

## 2022-12-20 DIAGNOSIS — C770 Secondary and unspecified malignant neoplasm of lymph nodes of head, face and neck: Secondary | ICD-10-CM | POA: Diagnosis not present

## 2022-12-20 DIAGNOSIS — C771 Secondary and unspecified malignant neoplasm of intrathoracic lymph nodes: Secondary | ICD-10-CM | POA: Diagnosis not present

## 2022-12-20 DIAGNOSIS — Z79620 Long term (current) use of immunosuppressive biologic: Secondary | ICD-10-CM | POA: Diagnosis not present

## 2022-12-20 DIAGNOSIS — C760 Malignant neoplasm of head, face and neck: Secondary | ICD-10-CM

## 2022-12-20 LAB — RAD ONC ARIA SESSION SUMMARY
Course Elapsed Days: 21
Plan Fractions Treated to Date: 13
Plan Prescribed Dose Per Fraction: 2 Gy
Plan Total Fractions Prescribed: 30
Plan Total Prescribed Dose: 60 Gy
Reference Point Dosage Given to Date: 26 Gy
Reference Point Session Dosage Given: 2 Gy
Session Number: 13

## 2022-12-20 LAB — T4: T4, Total: 10.4 ug/dL (ref 4.5–12.0)

## 2022-12-20 NOTE — Progress Notes (Signed)
Nutrition Assessment  Reason for Assessment: HNC  ASSESSMENT: 67 year old male with history of laryngeal cancer as well as lymphoma. S/p radiation therapy for both. Now with SCC of right neck and mediastinum, progressive on systemic therapy. He is currently receiving radiotherapy under the care of Dr. Basilio Cairo (start 7/23). Patient is followed by Dr. Candise Che  Past medical history includes HTN, GERD without esophagitis, DM, RLS  Met with patient following radiation treatment. He endorses poor appetite related to altered taste of foods, sore throat, and pain with swallowing. Pt reports unable to eat any foods he likes as "it taste horrible." Regular textures are painful to swallow and finds pills more difficult to take orally. States the get stuck in his throat. Pt recalls eating only watermelon all of last week as this was soothing and tasted good. He is "forcing" himself to eat more due to wt loss. Recalls sous meat, fruit pops, and pudding. Pt was tolerating ice cream, but now this taste bad. He tried fair life core power (150 kcal, 42g) yesterday. This was okay. He is not drinking Boost/Ensure. Pt reports he does like milk (cows/almond). His saliva is thick and mouth is dry. Pt is not doing baking soda salt water rinses currently. Pt is drinking 64 ounces of water. Pt reports altoid mints help with thick saliva. Pt tried carafate, but unsure why this was prescribed. This did not help with swallowing and stopped taking. He denies mucositis.   Nutrition Focused Physical Exam:   Orbital Region: severe Buccal Region: moderate Temple Region: moderate Shoulder and Acromion Bone Region: moderate Dorsal Hand: moderate Hair: wearing hat Eyes: reviewed  Mouth: reviewed (dry) Nails: reviewed    Medications: MMW, glucophage, MVI, zofran, oxycodone, compazine, carafate   Labs: 8/12 - glucose 134, Cr 1.35, Hgb 9.7   Anthropometrics: Weights have decreased 52 lbs in 29 weeks - 16% which is severe for time  frame. Patient has lost 19 lbs (7%) in the last 3 weeks - also severe for time frame. Per chart, 292 lb 4 oz on 7/22  Height: 6' Weight: 273 lb 12 oz (8/12) UBW: 325 lb (01/29) BMI: 37.13   Estimated Energy Needs  Kcals: 3100-3350 Protein: 150-175 Fluid: >/= 3.1 L   NUTRITION DIAGNOSIS: Inadequate oral intake related to laryngeal cancer progression, currently undergoing radiation therapy as evidenced by dysgeusia, odynophagia, dietary recall meeting <50% of estimated nutrition needs, severe 7% wt loss in 3 weeks   MALNUTRITION DIAGNOSIS: Pt meets criteria for acute on chronic moderate malnutrition related to cancer progression as evidenced by moderate fat/muscle depletions, 16% wt loss in last 7 months, intake meeting </= 75% of estimated needs   INTERVENTION:  Educated on importance of increased calorie and protein energy intake to maintain strength/weights  Discussed strategies for altered taste, encouraged baking soda salt water rinses several times daily and before meals Encouraged soft smooth high calorie high protein foods - handout with list of foods + shake recipes provided Educated on strategies for dry mouth/thick saliva - handout with tips provided  Pt denies esophagitis - carafate not likely be of benefit to pt at this time. Suggest viscous lidocaine + hycet for odynophagia Pt is at high risk for worsening nutrition status given poor oral current nutrition impact symptoms and cumulative effects of radiation therapy (13/30 completed on 8/13). Discussed this in depth with pt and possible need for feeding tube to meet nutrition/hydration needs Weekly nutrition f/u scheduled - pt appreciative    MONITORING, EVALUATION, GOAL: Pt will  tolerate increased calories and protein to minimize further wt loss   Next Visit: Wednesday August 21 after radiation

## 2022-12-20 NOTE — Progress Notes (Signed)
[facility] CSW Progress Note  Visual merchandiser met with patient in supportive services to discuss financial resources.  Pt was approved for the [employer] in April and those funds have been used.  Pt states his son resides with him and unfortunately has been out of work for the past month and unable to contribute financially.  Pt has outstanding medical bills which he is presently unable to pay.  CSW provided pt with the link for [employer] to apply for a grant to assist with co-pays as well as the telephone number for [facility] to call and register for financial assistance.  CSW to remain available to provide support as appropriate throughout duration of treatment.      Rachel Moulds, LCSW Clinical Social Worker [facility]

## 2022-12-21 ENCOUNTER — Telehealth: Payer: Self-pay

## 2022-12-21 ENCOUNTER — Other Ambulatory Visit: Payer: Self-pay | Admitting: Radiation Oncology

## 2022-12-21 ENCOUNTER — Other Ambulatory Visit: Payer: Self-pay

## 2022-12-21 ENCOUNTER — Ambulatory Visit
Admission: RE | Admit: 2022-12-21 | Discharge: 2022-12-21 | Disposition: A | Discharge status: Home / Self Care | Payer: Medicare HMO | Source: Ambulatory Visit | Attending: Radiation Oncology | Admitting: Radiation Oncology

## 2022-12-21 DIAGNOSIS — C801 Malignant (primary) neoplasm, unspecified: Secondary | ICD-10-CM | POA: Diagnosis not present

## 2022-12-21 DIAGNOSIS — C771 Secondary and unspecified malignant neoplasm of intrathoracic lymph nodes: Secondary | ICD-10-CM

## 2022-12-21 DIAGNOSIS — C770 Secondary and unspecified malignant neoplasm of lymph nodes of head, face and neck: Secondary | ICD-10-CM | POA: Diagnosis not present

## 2022-12-21 DIAGNOSIS — C4492 Squamous cell carcinoma of skin, unspecified: Secondary | ICD-10-CM

## 2022-12-21 DIAGNOSIS — Z79620 Long term (current) use of immunosuppressive biologic: Secondary | ICD-10-CM | POA: Diagnosis not present

## 2022-12-21 DIAGNOSIS — Z510 Encounter for antineoplastic radiation therapy: Secondary | ICD-10-CM | POA: Diagnosis not present

## 2022-12-21 DIAGNOSIS — C320 Malignant neoplasm of glottis: Secondary | ICD-10-CM | POA: Diagnosis not present

## 2022-12-21 LAB — RAD ONC ARIA SESSION SUMMARY
Course Elapsed Days: 22
Plan Fractions Treated to Date: 14
Plan Prescribed Dose Per Fraction: 2 Gy
Plan Total Fractions Prescribed: 30
Plan Total Prescribed Dose: 60 Gy
Reference Point Dosage Given to Date: 28 Gy
Reference Point Session Dosage Given: 2 Gy
Session Number: 14

## 2022-12-21 MED ORDER — LIDOCAINE VISCOUS HCL 2 % MT SOLN
OROMUCOSAL | 3 refills | Status: DC
Start: 2022-12-21 — End: 2023-06-24

## 2022-12-21 MED ORDER — HYDROCODONE-ACETAMINOPHEN 7.5-325 MG/15ML PO SOLN
10.0000 mL | Freq: Four times a day (QID) | ORAL | 0 refills | Status: DC | PRN
Start: 2022-12-21 — End: 2023-05-15

## 2022-12-21 NOTE — Telephone Encounter (Signed)
Rn attempted to call pt to let him know about prescriptions called in by Dr. Basilio Cairo. Rn was unable to reach pt. Rn left call back information for pt and will attempt to call pt back later on this afternoon.

## 2022-12-21 NOTE — Telephone Encounter (Signed)
Pt called back RN. Rn was able to let him know that Dr. Basilio Cairo called in pain medication for him to his preferred pharmacy. Rn also discussed the risk of constipation with pt (associated with narcotics). He stated he would look into getting some Miralax to help if needed. Pt was grateful for the phone call and instructions.

## 2022-12-22 ENCOUNTER — Ambulatory Visit
Admission: RE | Admit: 2022-12-22 | Discharge: 2022-12-22 | Disposition: A | Discharge status: Home / Self Care | Payer: Medicare HMO | Source: Ambulatory Visit | Attending: Radiation Oncology | Admitting: Radiation Oncology

## 2022-12-22 ENCOUNTER — Ambulatory Visit: Payer: Medicare HMO | Attending: Radiation Oncology

## 2022-12-22 ENCOUNTER — Other Ambulatory Visit: Payer: Self-pay

## 2022-12-22 DIAGNOSIS — C770 Secondary and unspecified malignant neoplasm of lymph nodes of head, face and neck: Secondary | ICD-10-CM | POA: Diagnosis not present

## 2022-12-22 DIAGNOSIS — Z79620 Long term (current) use of immunosuppressive biologic: Secondary | ICD-10-CM | POA: Diagnosis not present

## 2022-12-22 DIAGNOSIS — R1313 Dysphagia, pharyngeal phase: Secondary | ICD-10-CM | POA: Insufficient documentation

## 2022-12-22 DIAGNOSIS — C771 Secondary and unspecified malignant neoplasm of intrathoracic lymph nodes: Secondary | ICD-10-CM | POA: Insufficient documentation

## 2022-12-22 DIAGNOSIS — C320 Malignant neoplasm of glottis: Secondary | ICD-10-CM | POA: Diagnosis not present

## 2022-12-22 DIAGNOSIS — Z510 Encounter for antineoplastic radiation therapy: Secondary | ICD-10-CM | POA: Diagnosis not present

## 2022-12-22 DIAGNOSIS — C801 Malignant (primary) neoplasm, unspecified: Secondary | ICD-10-CM | POA: Diagnosis not present

## 2022-12-22 LAB — RAD ONC ARIA SESSION SUMMARY
Course Elapsed Days: 23
Plan Fractions Treated to Date: 15
Plan Prescribed Dose Per Fraction: 2 Gy
Plan Total Fractions Prescribed: 30
Plan Total Prescribed Dose: 60 Gy
Reference Point Dosage Given to Date: 30 Gy
Reference Point Session Dosage Given: 2 Gy
Session Number: 15

## 2022-12-22 NOTE — Patient Instructions (Addendum)
SWALLOWING EXERCISES Do these until 6 months after your last day of radiation, then 2 times per week afterwards Exercises can be done in sets of 5 reps, at least 4 sets/day  Effortful Swallows - Press your tongue against the roof of your mouth for 3 seconds, then squeeze the muscles in your neck while you swallow your saliva or a sip of water - Repeat 10-15 times, 2-3 times a day, and use whenever you eat or drink  Masako Swallow - swallow with your tongue sticking out - Stick tongue out past your lips and gently bite tongue with your teeth - Swallow, while holding your tongue with your teeth - Repeat 10-15 times, 2-3 times a day *use a wet spoon if your mouth gets dry*  Mendelsohn Maneuver - "half swallow" exercise - Start to swallow, and keep your Adam's apple up by squeezing hard with the muscles of the throat - Hold the squeeze for 5-7 seconds and then relax - Repeat 10-15 times, 2-3 times a day *use a wet spoon if your mouth gets dry*       4.   "Super Swallow"  - Take a breath and hold it  - Swallow then IMMEDIATELY cough  - Repeat 10 times, 2-3 times a day - Repeat 20 times, 2-3 times a day       5. "Super Swallow"  - Take a breath and hold it  - Swallow then IMMEDIATELY cough  - Repeat 10 times, 2-3 times a day

## 2022-12-22 NOTE — Therapy (Signed)
OUTPATIENT SPEECH LANGUAGE PATHOLOGY ONCOLOGY EVALUATION   Patient Name: Jesus [name] MRN: [id number] DOB:[birth year], 67 y.o., male Today's Date: 12/22/2022  PCP: Noberto Retort REFERRING PROVIDER: Lonie Peak, MD  END OF SESSION:  End of Session - 12/22/22 2028     Visit Number 1    Number of Visits 7    Date for SLP Re-Evaluation 03/22/23    SLP Start Time 1037    SLP Stop Time  1115    SLP Time Calculation (min) 38 min    Activity Tolerance Patient tolerated treatment well             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- last radiation 06-2018   Cataract    removed both eyes with l;ens implants    COPD (chronic obstructive pulmonary disease) (HCC)    Family history of colon cancer    in his brother- dx'd age 97    History of chemotherapy    last 06-01-2018   History of kidney stones    History of radiation therapy    last radiation 06-2018   Hyperlipidemia    currently under control   Hypertension    Irregular heart beats    Lymphadenopathy    Pain, lower leg    Bilateral   Peripheral arterial disease (HCC)    Pre-diabetes    Red-green color blindness    RLS (restless legs syndrome)    Snores    Wears glasses    Past Surgical History:  Procedure Laterality Date   CATARACT EXTRACTION W/ INTRAOCULAR LENS  IMPLANT, BILATERAL     COLONOSCOPY     DIRECT LARYNGOSCOPY Right 02/03/2022   Procedure: DIRECT LARYNGOSCOPY WITH BIOPSY OF RIGHT FALSE VOCAL CORD;  Surgeon: Osborn Coho, MD;  Location: [facility] OR;  Service: ENT;  Laterality: Right;   dislodged salava stone     FRACTURE SURGERY     HAND ARTHROPLASTY  1995   crushed left hand   INGUINAL LYMPH NODE BIOPSY Left 01/02/2018   Procedure: LEFT INGUINAL LYMPH NODE BIOPSY;  Surgeon: Emelia Loron,  MD;  Location: [facility] OR;  Service: General;  Laterality: Left;   IR IMAGING GUIDED PORT INSERTION  01/15/2018   IR IMAGING GUIDED PORT INSERTION  08/10/2022   IR REMOVAL TUN ACCESS W/ PORT W/O FL MOD SED  03/11/2019   MEDIASTINOSCOPY N/A 07/22/2022   Procedure: MEDIASTINOSCOPY;  Surgeon: Loreli Slot, MD;  Location: [facility] OR;  Service: Thoracic;  Laterality: N/A;   MICROLARYNGOSCOPY Left 01/17/2014   Procedure: MICROLARYNGOSCOPY WITH EXCISION OF THE BIOPSY OF LEFT VOCAL CORD LESION;  Surgeon: Serena Colonel, MD;  Location: [facility];  Service: ENT;  Laterality: Left;   MICROLARYNGOSCOPY N/A 09/16/2020   Procedure: MICROLARYNGOSCOPY with Biopsy of vocal cord lesion;  Surgeon: Osborn Coho, MD;  Location: [facility] OR;  Service: ENT;  Laterality: N/A;   ORIF FOOT FRACTURE  2005   left   REFRACTIVE SURGERY Right    removed cloudiness in right eye after cataract removal    Patient Active Problem List   Diagnosis Date Noted   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   Pain due to onychomycosis of toenail 10/25/2022   Port-A-Cath in  place 09/05/2022   Head and neck cancer (HCC) 08/04/2022   Chronic pain 07/15/2021   Diabetes mellitus without complication (HCC) 07/15/2021   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   Gastroesophageal reflux disease without esophagitis 07/15/2021   History of lymphoma 07/15/2021   Lipoma of skin and subcutaneous tissue of trunk 07/15/2021   Mixed hyperlipidemia 07/15/2021   Prediabetes 07/15/2021   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   Tobacco dependence 07/15/2021   Trigger thumb of left hand 02/17/2021   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   Hoarseness 08/11/2020   Chronic pain of right knee 10/17/2018   Large cell (diffuse) non-Hodgkin's lymphoma (HCC) 05/10/2018   Bacteremia due to Escherichia coli    HTN (hypertension) 04/17/2018    RLS (restless legs syndrome) 04/17/2018   Abnormal LFTs 04/17/2018   Acute metabolic encephalopathy 04/16/2018   AKI (acute kidney injury) (HCC) 04/16/2018   Dehydration    Fever, unspecified    Sepsis (HCC)    Disorientation    Non-Hodgkin's lymphoma of skin (HCC) 04/03/2018   Hypokalemia    Hypomagnesemia    Anemia    Encounter for antineoplastic chemotherapy    At high risk of tumor lysis syndrome    Swelling of lower leg    Diffuse large B cell lymphoma (HCC) 01/15/2018   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HCC) 01/12/2018   Counseling regarding advance care planning and goals of care 01/12/2018   Bilateral leg pain 05/27/2014    ONSET DATE: see "pertinent details"   REFERRING DIAG: 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 Pt accompanied by: self  PERTINENT HISTORY:  SCC of the Larynx diagnosed in May 2022- now with cervical and left paratracheal nodal metastases. He presented to Dr. Basilio Cairo on 11/11/22 to discuss management of nodal metastases from a laryngeal primary referred by Dr. Candise Che. To review from last visit on 06/01/22 the patient had recently undergone a right neck lymph node biopsy on 05/25/22 which showed findings consistent with metastatic keratinizing squamous cell carcinoma. Biopsy was indicated by new right neck and mediastinal adenopathy which he first noticed in December 2023. (He soon after presented for an MRI of the brain on 06/02/22 which showed no intracranial abnormalities). Based on biopsy results, the patient followed up with Dr. Candise Che who recommended proceeding with a PET scan. Subsequent PET scan on 06/08/22 demonstrated: moderate hypermetabolism associated  with the right cervical and mediastinal metastatic lymphadenopathy; nonspecific mildly prominent activity within the nasopharynx and left pharyngeal tonsil (potentially reactive in etiology); and resolution of the previously demonstrated left pelvic and inguinal adenopathy. PET otherwise showed no evidence of distant metastatic disease and no findings suspicious for recurrent lymphoma. 07/05/22 He saw Dr. Ernestene Kiel at [facility] to discuss the biopsy findings. During that visit the patient endorsed increasing hoarseness over the past couple of weeks and intermittent bilateral ear pain. Dr. Ernestene Kiel recommended discussing at ENT TB for optimal treatment course. He was subsequently referred to Dr. Dorris Fetch on 07/14/22 who recommended proceeding with a mediastinoscopy for additional nodal biopsies/excisions of the left paratracheal adenopathy to confirm his diagnosis. Biopsies of the left paratracheal mass collected on 07/22/22 showed fibrous tissue involved by  squamous cell carcinoma; nodal status of 3/3 lymph nodes negative for metastatic carcinoma. Dr. Dorris Fetch did not recommend resection given that it was not possible to remove the visible disease.  Dr. Candise Che subsequently recommended proceeding with systemic treatment consisting of carbo/5FU/Pembrolizumab based on PdL1 status, followed by palliative radiation therapy to any residual disease s/p systemic treatment. He received his first dose on 08/15/22. Chemo toxicities reported by the patient throughout the course of systemic treatment included mild fatigue, nausea (improved with steroids) occasional bilateral hand and leg neuropathy due to Pembrolizumab, and swallowing problems. He received his 4th cycle of chemotherapy on 11/08/22. 11/07/22 PET demonstrated progressive enlargement and hypermetabolic activity within the previously demonstrated right level II cervical and left paratracheal lymph nodes consistent with progressive metastatic disease. PET otherwise  showed no evidence of metastatic disease in the abdomen or pelvis, and no evidence of osseous metastatic disease.Treatment plan:  He will receive 30 fractions of radiation to his right neck and mediastinum.  Radiation started 12/01/22 and will complete 01/13/23  PAIN:  Are you having pain? Yes: NPRS scale: 5/10 Pain location: throat Pain description: sore  FALLS: Has patient fallen in last 6 months?  No  LIVING ENVIRONMENT: Lives with: lives alone Lives in: House/apartment  PLOF:  Level of assistance: Independent with ADLs, Independent with IADLs Employment: Retired  PATIENT GOALS: Pt indicated he would like to consistently swallow normally  OBJECTIVE:   COGNITION: Overall cognitive status: Within functional limits for tasks assessed  LANGUAGE: Receptive and Expressive language appeared WNL.  ORAL MOTOR EXAMINATION: Overall status: Impaired: Lingual: Right (ROM)  MOTOR SPEECH: Overall motor speech: Appears intact  CLINICAL SWALLOW ASSESSMENT:   Current diet: Dysphagia 3 (mechanical soft), Dysphagia 2 (chopped/minced), Dysphagia 1 (puree), and thin liquids Objective swallow impairments: indicates that pharyngeal clearance is hindered on rt. Objective recommended compensations: use head turn to rt when eating, use liquid wash. It would also be helpful for pt to masticate to puree/pulverize food and prior to the swallow Dentition: adequate natural dentition Patient directly observed with POs: Yes: dysphagia 3 (soft) and thin liquids  Feeding: able to feed self Liquids provided by: cup Oral phase signs and symptoms:  none Pharyngeal phase signs and symptoms: multiple swallows and complaints of residue Caymon has been eating soft foods and drinking ensure, due to reported impaired pharyngeal transit on rt. He endorsed pharyngeal dysphagia with pills on rt side as well (see "S" statement). Today when pt ate items from Dys III and Dys I and drank thin liquids, there were no s/sx of  oral difficulties noted, however pt required to [facility] for an extended time with ham sandwich in order to ensure best possible pharyngeal transit, and even then c/o mild residue, cleared with swallows of water with head turn to lt. MBS IS RECOMMENDED. At this time pt swallowing is deemed WNL/WFL with dys I and II, and well-masticated dys III items (with head turn to rt and liquid wash), and thin liquids.   TODAY'S TREATMENT:  DATE:   12/22/22 (eval): Research states the risk for dysphagia increases due to radiation and/or chemotherapy treatment due to a variety of factors, so SLP educated the pt about the possibility of reduced/limited ability for PO intake during rad tx. SLP also educated pt regarding possible changes to swallowing musculature after rad tx, and why adherence to dysphagia HEP provided today and PO consumption was necessary to inhibit muscle fibrosis following rad tx and to mitigate muscle disuse atrophy. SLP informed pt why this would be detrimental to their swallowing status and to their pulmonary health. Pt demonstrated understanding of these things to SLP. SLP encouraged pt to safely eat and drink as deep into their radiation/chemotherapy as possible to provide the best possible long-term swallowing outcome for pt.    SLP then developed an individualized HEP for pt involving oral and pharyngeal strengthening and ROM and pt was instructed how to perform these exercises, including SLP demonstration. After SLP demonstration, pt return demonstrated each exercise. SLP ensured pt performance was correct prior to educating pt on next exercise. Pt required occasional min-mod cues faded to modified independent to perform HEP. Pt was instructed to complete this program 6-7 days/week, at last 20 reps/day without any less than 5 reps at once, until 6 months after his  last day of rad tx, and then x2 a week after that, indefinitely.    PATIENT EDUCATION: Education details: late effects head/neck radiation on swallow function, HEP procedure, and modification to HEP when difficulty experienced with swallowing during and after radiation course Person educated: Patient Education method: Explanation, Demonstration, Verbal cues, and Handouts Education comprehension: verbalized understanding, returned demonstration, verbal cues required, and needs further education   ASSESSMENT:  CLINICAL IMPRESSION: Patient is a 67 y.o. M who was seen today for assessment of swallowing as they undergo radiation therapy. He has been eating soft foods and ensure, due to impaired pharyngeal transit on rt. Today when pt ate items from Dys III and Dys I and drank thin liquids, no s/sx of oral difficulties noted, however pt required to [facility] for an extended time to ensure best possible pharyngeal transit with dys III, and even then c/o residue, cleared with swallows of water with head turn to lt. MBS IS RECOMMENDED. At this time pt swallowing is deemed WNL/WFL with dys I and II, and well-masticated dys III items (with head turn to rt and liquid wash), and thin liquids. No overt s/sx aspiration were observed. There are no overt s/s aspiration PNA observed by SLP nor any reported by pt at this time. Data indicate that pt's swallow ability will likely decrease over the course of radiation/chemoradiation therapy and could very well decline over time following the conclusion of that therapy due to muscle disuse atrophy and/or muscle fibrosis. Pt will cont to need to be seen by SLP in order to assess safety of PO intake, assess the need for recommending any objective swallow assessment, and ensuring pt is correctly completing the individualized HEP.  OBJECTIVE IMPAIRMENTS: include dysphagia. These impairments are limiting patient from safety when swallowing. Factors affecting potential to  achieve goals and functional outcome are  none noted . Patient will benefit from skilled SLP services to address above impairments and improve overall function.  REHAB POTENTIAL: Good   GOALS: Goals reviewed with patient? No  SHORT TERM GOALS: Target: 3rd total session        1.   Pt will complete HEP with modified independence in 2 sessions Baseline: Goal status: Initial   2.  pt will tell SLP why pt is completing HEP with modified independence Baseline:  Goal status: Initial   3.  pt will describe 3 overt s/s aspiration PNA with modified independence Baseline:  Goal status: Initial   4.  pt will tell SLP how a food journal could hasten return to a more normalized diet Baseline:  Goal status: Initial     LONG TERM GOALS: Target: 7th total session   1.  pt will complete HEP with independence over two visits Baseline:  Goal status: Initial   2.  pt will describe how to modify HEP over time, and the timeline associated with reduction in HEP frequency with modified independence over two sessions Baseline:  Goal status: Initial  3.  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 []  Surgery []  Fall []  Ongoing issue []  Arthritis [x]  Other: ___cancer_________  Laterality: [x]  Rt []  Lt []  Both  Check all possible CPT codes:  *CHOOSE 10 OR LESS*    []  97110 (Therapeutic Exercise)  []  92507 (SLP  Treatment)  []  97112 (Neuro Re-ed)   [x]  92526 (Swallowing Treatment)   []  97116 (Gait Training)   []  [id number] (Cognitive Training, 1st 15 minutes) []  97140 (Manual Therapy)   []  97130 (Cognitive Training, each add'l 15 minutes)  []  97164 (Re-evaluation)                              []  Other, List CPT Code ____________  []  97530 (Therapeutic Activities)     []  97535 (Self Care)   []  All codes above (97110 - 97535)  []  16109 (Mechanical Traction)  []  97014 (E-stim Unattended)  []  97032 (E-stim manual)  []  97033 (Ionto)  []  97035 (Ultrasound) []  97750 (Physical Performance Training) []  [id number] (Aquatic Therapy) []  97016 (Vasopneumatic Device) []  [id number] (Paraffin) []  97034 (Contrast Bath) []  97597 (Wound Care 1st 20 sq cm) []  97598 (Wound Care each add'l 20 sq cm) []  97760 (Orthotic Fabrication, Fitting, Training Initial) []  [id number] (Prosthetic Management and Training Initial) []  [id number] (Orthotic or Prosthetic Training/ Modification Subsequent)

## 2022-12-23 ENCOUNTER — Ambulatory Visit
Admission: RE | Admit: 2022-12-23 | Discharge: 2022-12-23 | Disposition: A | Discharge status: Home / Self Care | Payer: Medicare HMO | Source: Ambulatory Visit | Attending: Radiation Oncology | Admitting: Radiation Oncology

## 2022-12-23 ENCOUNTER — Other Ambulatory Visit: Payer: Self-pay

## 2022-12-23 DIAGNOSIS — Z79620 Long term (current) use of immunosuppressive biologic: Secondary | ICD-10-CM | POA: Diagnosis not present

## 2022-12-23 DIAGNOSIS — C320 Malignant neoplasm of glottis: Secondary | ICD-10-CM | POA: Diagnosis not present

## 2022-12-23 DIAGNOSIS — C770 Secondary and unspecified malignant neoplasm of lymph nodes of head, face and neck: Secondary | ICD-10-CM | POA: Diagnosis not present

## 2022-12-23 DIAGNOSIS — C801 Malignant (primary) neoplasm, unspecified: Secondary | ICD-10-CM | POA: Diagnosis not present

## 2022-12-23 DIAGNOSIS — C771 Secondary and unspecified malignant neoplasm of intrathoracic lymph nodes: Secondary | ICD-10-CM | POA: Diagnosis not present

## 2022-12-23 DIAGNOSIS — Z510 Encounter for antineoplastic radiation therapy: Secondary | ICD-10-CM | POA: Diagnosis not present

## 2022-12-23 LAB — RAD ONC ARIA SESSION SUMMARY
Course Elapsed Days: 24
Plan Fractions Treated to Date: 16
Plan Prescribed Dose Per Fraction: 2 Gy
Plan Total Fractions Prescribed: 30
Plan Total Prescribed Dose: 60 Gy
Reference Point Dosage Given to Date: 32 Gy
Reference Point Session Dosage Given: 2 Gy
Session Number: 16

## 2022-12-24 ENCOUNTER — Other Ambulatory Visit: Payer: Self-pay

## 2022-12-25 ENCOUNTER — Encounter: Payer: Self-pay | Admitting: Hematology

## 2022-12-26 ENCOUNTER — Other Ambulatory Visit (HOSPITAL_COMMUNITY): Payer: Self-pay | Admitting: *Deleted

## 2022-12-26 ENCOUNTER — Ambulatory Visit
Admission: RE | Admit: 2022-12-26 | Discharge: 2022-12-26 | Disposition: A | Discharge status: Home / Self Care | Payer: Medicare HMO | Source: Ambulatory Visit | Attending: Radiation Oncology | Admitting: Radiation Oncology

## 2022-12-26 ENCOUNTER — Telehealth (HOSPITAL_COMMUNITY): Payer: Self-pay | Admitting: *Deleted

## 2022-12-26 ENCOUNTER — Other Ambulatory Visit: Payer: Self-pay

## 2022-12-26 DIAGNOSIS — R1310 Dysphagia, unspecified: Secondary | ICD-10-CM

## 2022-12-26 DIAGNOSIS — Z79620 Long term (current) use of immunosuppressive biologic: Secondary | ICD-10-CM | POA: Diagnosis not present

## 2022-12-26 DIAGNOSIS — C771 Secondary and unspecified malignant neoplasm of intrathoracic lymph nodes: Secondary | ICD-10-CM | POA: Diagnosis not present

## 2022-12-26 DIAGNOSIS — C770 Secondary and unspecified malignant neoplasm of lymph nodes of head, face and neck: Secondary | ICD-10-CM | POA: Diagnosis not present

## 2022-12-26 DIAGNOSIS — C801 Malignant (primary) neoplasm, unspecified: Secondary | ICD-10-CM | POA: Diagnosis not present

## 2022-12-26 DIAGNOSIS — Z510 Encounter for antineoplastic radiation therapy: Secondary | ICD-10-CM | POA: Diagnosis not present

## 2022-12-26 DIAGNOSIS — C320 Malignant neoplasm of glottis: Secondary | ICD-10-CM | POA: Diagnosis not present

## 2022-12-26 LAB — RAD ONC ARIA SESSION SUMMARY
Course Elapsed Days: 27
Plan Fractions Treated to Date: 17
Plan Prescribed Dose Per Fraction: 2 Gy
Plan Total Fractions Prescribed: 30
Plan Total Prescribed Dose: 60 Gy
Reference Point Dosage Given to Date: 34 Gy
Reference Point Session Dosage Given: 2 Gy
Session Number: 17

## 2022-12-26 NOTE — Telephone Encounter (Signed)
Attempted to contact patient to schedule OP MBS. Left VM. RKEEL 

## 2022-12-27 ENCOUNTER — Ambulatory Visit
Admission: RE | Admit: 2022-12-27 | Discharge: 2022-12-27 | Disposition: A | Discharge status: Home / Self Care | Payer: Medicare HMO | Source: Ambulatory Visit | Attending: Radiation Oncology | Admitting: Radiation Oncology

## 2022-12-27 ENCOUNTER — Other Ambulatory Visit: Payer: Self-pay

## 2022-12-27 DIAGNOSIS — C320 Malignant neoplasm of glottis: Secondary | ICD-10-CM | POA: Diagnosis not present

## 2022-12-27 DIAGNOSIS — C770 Secondary and unspecified malignant neoplasm of lymph nodes of head, face and neck: Secondary | ICD-10-CM | POA: Diagnosis not present

## 2022-12-27 DIAGNOSIS — C771 Secondary and unspecified malignant neoplasm of intrathoracic lymph nodes: Secondary | ICD-10-CM | POA: Diagnosis not present

## 2022-12-27 DIAGNOSIS — Z79620 Long term (current) use of immunosuppressive biologic: Secondary | ICD-10-CM | POA: Diagnosis not present

## 2022-12-27 DIAGNOSIS — C801 Malignant (primary) neoplasm, unspecified: Secondary | ICD-10-CM | POA: Diagnosis not present

## 2022-12-27 DIAGNOSIS — Z510 Encounter for antineoplastic radiation therapy: Secondary | ICD-10-CM | POA: Diagnosis not present

## 2022-12-27 LAB — RAD ONC ARIA SESSION SUMMARY
Course Elapsed Days: 28
Plan Fractions Treated to Date: 18
Plan Prescribed Dose Per Fraction: 2 Gy
Plan Total Fractions Prescribed: 30
Plan Total Prescribed Dose: 60 Gy
Reference Point Dosage Given to Date: 36 Gy
Reference Point Session Dosage Given: 2 Gy
Session Number: 18

## 2022-12-28 ENCOUNTER — Inpatient Hospital Stay: Payer: Medicare HMO | Admitting: Dietician

## 2022-12-28 ENCOUNTER — Other Ambulatory Visit: Payer: Self-pay

## 2022-12-28 ENCOUNTER — Ambulatory Visit
Admission: RE | Admit: 2022-12-28 | Discharge: 2022-12-28 | Disposition: A | Discharge status: Home / Self Care | Payer: Medicare HMO | Source: Ambulatory Visit | Attending: Radiation Oncology | Admitting: Radiation Oncology

## 2022-12-28 DIAGNOSIS — C771 Secondary and unspecified malignant neoplasm of intrathoracic lymph nodes: Secondary | ICD-10-CM | POA: Diagnosis not present

## 2022-12-28 DIAGNOSIS — C320 Malignant neoplasm of glottis: Secondary | ICD-10-CM | POA: Diagnosis not present

## 2022-12-28 DIAGNOSIS — C801 Malignant (primary) neoplasm, unspecified: Secondary | ICD-10-CM | POA: Diagnosis not present

## 2022-12-28 DIAGNOSIS — C770 Secondary and unspecified malignant neoplasm of lymph nodes of head, face and neck: Secondary | ICD-10-CM | POA: Diagnosis not present

## 2022-12-28 DIAGNOSIS — Z79620 Long term (current) use of immunosuppressive biologic: Secondary | ICD-10-CM | POA: Diagnosis not present

## 2022-12-28 DIAGNOSIS — Z510 Encounter for antineoplastic radiation therapy: Secondary | ICD-10-CM | POA: Diagnosis not present

## 2022-12-28 LAB — RAD ONC ARIA SESSION SUMMARY
Course Elapsed Days: 29
Plan Fractions Treated to Date: 19
Plan Prescribed Dose Per Fraction: 2 Gy
Plan Total Fractions Prescribed: 30
Plan Total Prescribed Dose: 60 Gy
Reference Point Dosage Given to Date: 38 Gy
Reference Point Session Dosage Given: 2 Gy
Session Number: 19

## 2022-12-28 NOTE — Progress Notes (Signed)
Nutrition Follow-up:  Pt with history of laryngeal cancer as well as lymphoma. S/p radiation therapy for both. Now with SCC of right neck and mediastinum, progressive on systemic therapy. He is currently receiving radiotherapy under the care of Dr. Basilio Cairo   Met with pt following radiation. He is in good spirits today. Pt reports pain well managed with hycet and viscous lidocaine. Pt continues to have altered taste, however has been pushing nutrition. Pt states he has felt better/less foggy headed with increased po. He has tried many of the shake recipes RD provided. Reports drinking one most every morning. Pt eating small amounts of food often (cereal, chocolate pudding, peanut butter, fruits). He is drinking several glasses of whole milk. Pt adding CIB powder to this. He is also drinking a variety of oral nutrition supplements (quest, reason, ensure).    Medications: reviewed   Labs: no new labs for review  Anthropometrics: Wt 276.2 lb today increased   8/12 - 273 lb 12 oz  Estimated Energy Needs   Kcals: 3100-3350 Protein: 150-175 Fluid: >/= 3.1 L     NUTRITION DIAGNOSIS: Inadequate oral intake related to laryngeal cancer progression, currently undergoing radiation therapy as evidenced by dysgeusia, odynophagia, dietary recall meeting <50% of estimated nutrition needs, severe 7% wt loss in 3 weeks     MALNUTRITION DIAGNOSIS: Pt meets criteria for acute on chronic moderate malnutrition related to cancer progression as evidenced by moderate fat/muscle depletions, 16% wt loss in last 7 months, intake meeting </= 75% of estimated needs    INTERVENTION:  Continue oral intake of soft smooth textures  Continue high calorie high protein shakes and supplements for wt maintenance - CIB powder samples provided      MONITORING, EVALUATION, GOAL: weight trends, intake   NEXT VISIT: Tuesday August 27 after radiation

## 2022-12-29 ENCOUNTER — Ambulatory Visit
Admission: RE | Admit: 2022-12-29 | Discharge: 2022-12-29 | Disposition: A | Discharge status: Home / Self Care | Payer: Medicare HMO | Source: Ambulatory Visit | Attending: Radiation Oncology | Admitting: Radiation Oncology

## 2022-12-29 ENCOUNTER — Other Ambulatory Visit: Payer: Self-pay

## 2022-12-29 DIAGNOSIS — C770 Secondary and unspecified malignant neoplasm of lymph nodes of head, face and neck: Secondary | ICD-10-CM | POA: Diagnosis not present

## 2022-12-29 DIAGNOSIS — C771 Secondary and unspecified malignant neoplasm of intrathoracic lymph nodes: Secondary | ICD-10-CM | POA: Diagnosis not present

## 2022-12-29 DIAGNOSIS — C801 Malignant (primary) neoplasm, unspecified: Secondary | ICD-10-CM | POA: Diagnosis not present

## 2022-12-29 DIAGNOSIS — C320 Malignant neoplasm of glottis: Secondary | ICD-10-CM | POA: Diagnosis not present

## 2022-12-29 DIAGNOSIS — Z79620 Long term (current) use of immunosuppressive biologic: Secondary | ICD-10-CM | POA: Diagnosis not present

## 2022-12-29 DIAGNOSIS — Z510 Encounter for antineoplastic radiation therapy: Secondary | ICD-10-CM | POA: Diagnosis not present

## 2022-12-29 LAB — RAD ONC ARIA SESSION SUMMARY
Course Elapsed Days: 30
Plan Fractions Treated to Date: 20
Plan Prescribed Dose Per Fraction: 2 Gy
Plan Total Fractions Prescribed: 30
Plan Total Prescribed Dose: 60 Gy
Reference Point Dosage Given to Date: 40 Gy
Reference Point Session Dosage Given: 2 Gy
Session Number: 20

## 2022-12-30 ENCOUNTER — Ambulatory Visit
Admission: RE | Admit: 2022-12-30 | Discharge: 2022-12-30 | Disposition: A | Discharge status: Home / Self Care | Payer: Medicare HMO | Source: Ambulatory Visit | Attending: Radiation Oncology | Admitting: Radiation Oncology

## 2022-12-30 ENCOUNTER — Other Ambulatory Visit: Payer: Self-pay

## 2022-12-30 DIAGNOSIS — Z510 Encounter for antineoplastic radiation therapy: Secondary | ICD-10-CM | POA: Diagnosis not present

## 2022-12-30 DIAGNOSIS — C801 Malignant (primary) neoplasm, unspecified: Secondary | ICD-10-CM | POA: Diagnosis not present

## 2022-12-30 DIAGNOSIS — C771 Secondary and unspecified malignant neoplasm of intrathoracic lymph nodes: Secondary | ICD-10-CM | POA: Diagnosis not present

## 2022-12-30 DIAGNOSIS — C320 Malignant neoplasm of glottis: Secondary | ICD-10-CM | POA: Diagnosis not present

## 2022-12-30 DIAGNOSIS — Z79620 Long term (current) use of immunosuppressive biologic: Secondary | ICD-10-CM | POA: Diagnosis not present

## 2022-12-30 DIAGNOSIS — C770 Secondary and unspecified malignant neoplasm of lymph nodes of head, face and neck: Secondary | ICD-10-CM | POA: Diagnosis not present

## 2022-12-30 LAB — RAD ONC ARIA SESSION SUMMARY
Course Elapsed Days: 31
Plan Fractions Treated to Date: 21
Plan Prescribed Dose Per Fraction: 2 Gy
Plan Total Fractions Prescribed: 30
Plan Total Prescribed Dose: 60 Gy
Reference Point Dosage Given to Date: 42 Gy
Reference Point Session Dosage Given: 2 Gy
Session Number: 21

## 2023-01-02 ENCOUNTER — Ambulatory Visit
Admission: RE | Admit: 2023-01-02 | Discharge: 2023-01-02 | Disposition: A | Discharge status: Home / Self Care | Payer: Medicare HMO | Source: Ambulatory Visit | Attending: Radiation Oncology | Admitting: Radiation Oncology

## 2023-01-02 ENCOUNTER — Ambulatory Visit: Admission: RE | Admit: 2023-01-02 | Payer: Medicare HMO | Source: Ambulatory Visit

## 2023-01-02 ENCOUNTER — Other Ambulatory Visit: Payer: Self-pay

## 2023-01-02 DIAGNOSIS — I10 Essential (primary) hypertension: Secondary | ICD-10-CM | POA: Diagnosis not present

## 2023-01-02 DIAGNOSIS — C770 Secondary and unspecified malignant neoplasm of lymph nodes of head, face and neck: Secondary | ICD-10-CM | POA: Diagnosis not present

## 2023-01-02 DIAGNOSIS — I700 Atherosclerosis of aorta: Secondary | ICD-10-CM | POA: Diagnosis not present

## 2023-01-02 DIAGNOSIS — C771 Secondary and unspecified malignant neoplasm of intrathoracic lymph nodes: Secondary | ICD-10-CM | POA: Diagnosis not present

## 2023-01-02 DIAGNOSIS — C801 Malignant (primary) neoplasm, unspecified: Secondary | ICD-10-CM | POA: Diagnosis not present

## 2023-01-02 DIAGNOSIS — Z79620 Long term (current) use of immunosuppressive biologic: Secondary | ICD-10-CM | POA: Diagnosis not present

## 2023-01-02 DIAGNOSIS — Z125 Encounter for screening for malignant neoplasm of prostate: Secondary | ICD-10-CM | POA: Diagnosis not present

## 2023-01-02 DIAGNOSIS — J439 Emphysema, unspecified: Secondary | ICD-10-CM | POA: Diagnosis not present

## 2023-01-02 DIAGNOSIS — C109 Malignant neoplasm of oropharynx, unspecified: Secondary | ICD-10-CM | POA: Diagnosis not present

## 2023-01-02 DIAGNOSIS — C320 Malignant neoplasm of glottis: Secondary | ICD-10-CM | POA: Diagnosis not present

## 2023-01-02 DIAGNOSIS — E1165 Type 2 diabetes mellitus with hyperglycemia: Secondary | ICD-10-CM | POA: Diagnosis not present

## 2023-01-02 DIAGNOSIS — Z0000 Encounter for general adult medical examination without abnormal findings: Secondary | ICD-10-CM | POA: Diagnosis not present

## 2023-01-02 DIAGNOSIS — Z510 Encounter for antineoplastic radiation therapy: Secondary | ICD-10-CM | POA: Diagnosis not present

## 2023-01-02 LAB — RAD ONC ARIA SESSION SUMMARY
Course Elapsed Days: 34
Plan Fractions Treated to Date: 22
Plan Prescribed Dose Per Fraction: 2 Gy
Plan Total Fractions Prescribed: 30
Plan Total Prescribed Dose: 60 Gy
Reference Point Dosage Given to Date: 44 Gy
Reference Point Session Dosage Given: 2 Gy
Session Number: 22

## 2023-01-03 ENCOUNTER — Other Ambulatory Visit: Payer: Self-pay

## 2023-01-03 ENCOUNTER — Inpatient Hospital Stay: Payer: Medicare HMO | Admitting: Dietician

## 2023-01-03 ENCOUNTER — Encounter: Payer: Self-pay | Admitting: General Practice

## 2023-01-03 ENCOUNTER — Ambulatory Visit
Admission: RE | Admit: 2023-01-03 | Discharge: 2023-01-03 | Disposition: A | Discharge status: Home / Self Care | Payer: Medicare HMO | Source: Ambulatory Visit | Attending: Radiation Oncology | Admitting: Radiation Oncology

## 2023-01-03 DIAGNOSIS — C771 Secondary and unspecified malignant neoplasm of intrathoracic lymph nodes: Secondary | ICD-10-CM | POA: Diagnosis not present

## 2023-01-03 DIAGNOSIS — C801 Malignant (primary) neoplasm, unspecified: Secondary | ICD-10-CM | POA: Diagnosis not present

## 2023-01-03 DIAGNOSIS — Z79620 Long term (current) use of immunosuppressive biologic: Secondary | ICD-10-CM | POA: Diagnosis not present

## 2023-01-03 DIAGNOSIS — Z510 Encounter for antineoplastic radiation therapy: Secondary | ICD-10-CM | POA: Diagnosis not present

## 2023-01-03 DIAGNOSIS — C320 Malignant neoplasm of glottis: Secondary | ICD-10-CM | POA: Diagnosis not present

## 2023-01-03 DIAGNOSIS — C770 Secondary and unspecified malignant neoplasm of lymph nodes of head, face and neck: Secondary | ICD-10-CM | POA: Diagnosis not present

## 2023-01-03 LAB — RAD ONC ARIA SESSION SUMMARY
Course Elapsed Days: 35
Plan Fractions Treated to Date: 23
Plan Prescribed Dose Per Fraction: 2 Gy
Plan Total Fractions Prescribed: 30
Plan Total Prescribed Dose: 60 Gy
Reference Point Dosage Given to Date: 46 Gy
Reference Point Session Dosage Given: 2 Gy
Session Number: 23

## 2023-01-03 NOTE — Progress Notes (Signed)
[facility] Spiritual Care Note  Referred by nursing for additional layer of emotional support. Left voicemail with direct callback number and plan to reach out again as needed later in the week.   [address] [place], [place], [facility] Pager [phone] Voicemail [phone]

## 2023-01-03 NOTE — Progress Notes (Signed)
Nutrition Follow-up:  Pt with history of laryngeal cancer as well as lymphoma. S/p radiation therapy for both. Now with SCC of right neck and mediastinum, progressive on systemic therapy. He is currently receiving radiotherapy under the care of Dr. Basilio Cairo   Met with pt following radiation. Pt states he is having a much better day today. He reports having an easier time with swallowing yesterday. Recalls drinking 4 Ensure, eating 4 pudding cups, and a bunch of water. He had a good BM this morning. Feels relieved. Pt has thick saliva. He endorses pain with swallowing, however says swallowing has become easier last few days. Pt is doing baking soda salt water rinses once/day. He is doing swallowing exercises the best he can. Pt asking about 9/3 MBS.   Medications: reviewed   Labs: 8/12 labs reviewed   Anthropometrics: Last wt 272 lb (aria) on 8/26 decreased   8/19 - 276.2 lb 8/12 - 273 lb 12 oz  Estimated Energy Needs   Kcals: 3100-3350 Protein: 150-175 Fluid: >/= 3.1 L  NUTRITION DIAGNOSIS: Inadequate oral intake related to laryngeal cancer progression, currently undergoing radiation therapy as evidenced by dysgeusia, odynophagia, dietary recall meeting <50% of estimated nutrition needs, severe 7% wt loss in 3 weeks   INTERVENTION:  Continue drinking Ensure Complete/equivalent, 4-5/day - additional samples provided Encouraged high calorie high protein foods in soft smooth textures Continue baking soda salt water gargle, encouraged several times daily Educated on [facility] support group - flyer + monthly calendar provided    MONITORING, EVALUATION, GOAL: wt trends, intake   NEXT VISIT: Wednesday September 4 during infusion

## 2023-01-04 ENCOUNTER — Other Ambulatory Visit: Payer: Self-pay

## 2023-01-04 ENCOUNTER — Ambulatory Visit: Admission: RE | Admit: 2023-01-04 | Payer: Medicare HMO | Source: Ambulatory Visit

## 2023-01-04 DIAGNOSIS — Z79620 Long term (current) use of immunosuppressive biologic: Secondary | ICD-10-CM | POA: Diagnosis not present

## 2023-01-04 DIAGNOSIS — C770 Secondary and unspecified malignant neoplasm of lymph nodes of head, face and neck: Secondary | ICD-10-CM | POA: Diagnosis not present

## 2023-01-04 DIAGNOSIS — C771 Secondary and unspecified malignant neoplasm of intrathoracic lymph nodes: Secondary | ICD-10-CM | POA: Diagnosis not present

## 2023-01-04 DIAGNOSIS — C320 Malignant neoplasm of glottis: Secondary | ICD-10-CM | POA: Diagnosis not present

## 2023-01-04 DIAGNOSIS — Z510 Encounter for antineoplastic radiation therapy: Secondary | ICD-10-CM | POA: Diagnosis not present

## 2023-01-04 DIAGNOSIS — C801 Malignant (primary) neoplasm, unspecified: Secondary | ICD-10-CM | POA: Diagnosis not present

## 2023-01-04 LAB — RAD ONC ARIA SESSION SUMMARY
Course Elapsed Days: 36
Plan Fractions Treated to Date: 24
Plan Prescribed Dose Per Fraction: 2 Gy
Plan Total Fractions Prescribed: 30
Plan Total Prescribed Dose: 60 Gy
Reference Point Dosage Given to Date: 48 Gy
Reference Point Session Dosage Given: 2 Gy
Session Number: 24

## 2023-01-05 ENCOUNTER — Ambulatory Visit
Admission: RE | Admit: 2023-01-05 | Discharge: 2023-01-05 | Disposition: A | Discharge status: Home / Self Care | Payer: Medicare HMO | Source: Ambulatory Visit | Attending: Radiation Oncology | Admitting: Radiation Oncology

## 2023-01-05 ENCOUNTER — Other Ambulatory Visit: Payer: Self-pay

## 2023-01-05 DIAGNOSIS — C320 Malignant neoplasm of glottis: Secondary | ICD-10-CM | POA: Diagnosis not present

## 2023-01-05 DIAGNOSIS — C770 Secondary and unspecified malignant neoplasm of lymph nodes of head, face and neck: Secondary | ICD-10-CM | POA: Diagnosis not present

## 2023-01-05 DIAGNOSIS — C771 Secondary and unspecified malignant neoplasm of intrathoracic lymph nodes: Secondary | ICD-10-CM | POA: Diagnosis not present

## 2023-01-05 DIAGNOSIS — Z79620 Long term (current) use of immunosuppressive biologic: Secondary | ICD-10-CM | POA: Diagnosis not present

## 2023-01-05 DIAGNOSIS — Z510 Encounter for antineoplastic radiation therapy: Secondary | ICD-10-CM | POA: Diagnosis not present

## 2023-01-05 DIAGNOSIS — C801 Malignant (primary) neoplasm, unspecified: Secondary | ICD-10-CM | POA: Diagnosis not present

## 2023-01-05 LAB — RAD ONC ARIA SESSION SUMMARY
Course Elapsed Days: 37
Plan Fractions Treated to Date: 25
Plan Prescribed Dose Per Fraction: 2 Gy
Plan Total Fractions Prescribed: 30
Plan Total Prescribed Dose: 60 Gy
Reference Point Dosage Given to Date: 50 Gy
Reference Point Session Dosage Given: 2 Gy
Session Number: 25

## 2023-01-06 ENCOUNTER — Other Ambulatory Visit: Payer: Self-pay

## 2023-01-06 ENCOUNTER — Ambulatory Visit: Admission: RE | Admit: 2023-01-06 | Payer: Medicare HMO | Source: Ambulatory Visit

## 2023-01-06 DIAGNOSIS — C770 Secondary and unspecified malignant neoplasm of lymph nodes of head, face and neck: Secondary | ICD-10-CM | POA: Diagnosis not present

## 2023-01-06 DIAGNOSIS — C801 Malignant (primary) neoplasm, unspecified: Secondary | ICD-10-CM | POA: Diagnosis not present

## 2023-01-06 DIAGNOSIS — C771 Secondary and unspecified malignant neoplasm of intrathoracic lymph nodes: Secondary | ICD-10-CM | POA: Diagnosis not present

## 2023-01-06 DIAGNOSIS — C320 Malignant neoplasm of glottis: Secondary | ICD-10-CM | POA: Diagnosis not present

## 2023-01-06 DIAGNOSIS — Z510 Encounter for antineoplastic radiation therapy: Secondary | ICD-10-CM | POA: Diagnosis not present

## 2023-01-06 DIAGNOSIS — Z79620 Long term (current) use of immunosuppressive biologic: Secondary | ICD-10-CM | POA: Diagnosis not present

## 2023-01-06 LAB — RAD ONC ARIA SESSION SUMMARY
Course Elapsed Days: 38
Plan Fractions Treated to Date: 26
Plan Prescribed Dose Per Fraction: 2 Gy
Plan Total Fractions Prescribed: 30
Plan Total Prescribed Dose: 60 Gy
Reference Point Dosage Given to Date: 52 Gy
Reference Point Session Dosage Given: 2 Gy
Session Number: 26

## 2023-01-08 ENCOUNTER — Emergency Department (HOSPITAL_BASED_OUTPATIENT_CLINIC_OR_DEPARTMENT_OTHER)
Admission: EM | Admit: 2023-01-08 | Discharge: 2023-01-08 | Disposition: A | Discharge status: Home / Self Care | Payer: Medicare HMO | Attending: Emergency Medicine | Admitting: Emergency Medicine

## 2023-01-08 ENCOUNTER — Other Ambulatory Visit: Payer: Self-pay

## 2023-01-08 ENCOUNTER — Encounter (HOSPITAL_BASED_OUTPATIENT_CLINIC_OR_DEPARTMENT_OTHER): Payer: Self-pay | Admitting: Emergency Medicine

## 2023-01-08 ENCOUNTER — Ambulatory Visit
Admission: EM | Admit: 2023-01-08 | Discharge: 2023-01-08 | Disposition: A | Discharge status: Other Acute Inpatient Hospital | Payer: Medicare HMO

## 2023-01-08 DIAGNOSIS — T464X5A Adverse effect of angiotensin-converting-enzyme inhibitors, initial encounter: Secondary | ICD-10-CM | POA: Insufficient documentation

## 2023-01-08 DIAGNOSIS — Z7982 Long term (current) use of aspirin: Secondary | ICD-10-CM | POA: Insufficient documentation

## 2023-01-08 DIAGNOSIS — R220 Localized swelling, mass and lump, head: Secondary | ICD-10-CM | POA: Diagnosis present

## 2023-01-08 DIAGNOSIS — T783XXA Angioneurotic edema, initial encounter: Secondary | ICD-10-CM | POA: Diagnosis not present

## 2023-01-08 MED ORDER — DEXAMETHASONE SODIUM PHOSPHATE 10 MG/ML IJ SOLN
10.0000 mg | Freq: Once | INTRAMUSCULAR | Status: AC
  Administered 2023-01-08: 10 mg via INTRAMUSCULAR
  Filled 2023-01-08: qty 1

## 2023-01-08 MED ORDER — DIPHENHYDRAMINE HCL 50 MG/ML IJ SOLN
25.0000 mg | Freq: Once | INTRAMUSCULAR | Status: AC
  Administered 2023-01-08: 25 mg via INTRAMUSCULAR
  Filled 2023-01-08: qty 1

## 2023-01-08 NOTE — ED Notes (Signed)
Patient is being discharged from the Urgent Care and sent to the Emergency Department via Private Vehicle (Family Member Driver) . Per Provider Guy Sandifer), patient is in need of higher level of care due to Complexity of Care. Patient is aware and verbalizes understanding of plan of care.  Vitals:   01/08/23 0948  BP: 101/64  Pulse: (!) 102  Resp: 20  Temp: 98.4 F (36.9 C)  SpO2: 96%

## 2023-01-08 NOTE — ED Triage Notes (Signed)
"  My lips and face is swelling". "I am going through Radiation right now". "I drank some alcohol and smoked some weed Friday night that is the only thing new and/or it had been a long while". No trouble breathing. No tongue swelling.

## 2023-01-08 NOTE — ED Provider Notes (Signed)
[place] EMERGENCY DEPARTMENT AT [facility] Provider Note   CSN: [id number] Arrival date & time: 01/08/23  1045     History  Chief Complaint  Patient presents with   Facial Swelling    MAICHAEL [name] is a 67 y.o. male who presents emergency department with a chief complaint of face and lip swelling.  Patient reports that he has had swelling in his lips and cheeks for the past 2 days.  He denies any swelling in his tongue, throat.  He denies hives or wheezing.  Patient states he woke up with this and it has been persistent.  He has had intermittent swelling in the left jaw due to a tooth in the past but states he has no dental pain at this time.  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  He denies any new medications, signs or symptoms of anaphylaxis.  He is taking lisinopril and has been on that medication for years.  HPI     Home Medications Prior to Admission medications   Medication Sig Start Date End Date Taking? Authorizing Provider  ACCU-CHEK GUIDE test strip 1 each by Other route as needed for other. 01/05/23   [provider]  Accu-Chek Softclix Lancets lancets  01/05/23   [provider]  acetaminophen (TYLENOL) 500 MG tablet Take 1,500 mg by mouth daily as needed for moderate pain.    [provider]  amLODipine (NORVASC) 10 MG tablet TAKE 1 TABLET BY MOUTH EVERY DAY Patient taking differently: Take 10 mg by mouth every evening. 02/19/20   Shade Flood, MD  aspirin EC 81 MG tablet Take 81 mg by mouth every evening. Swallow whole.    [provider]  b complex vitamins capsule Take 1 capsule by mouth daily.    [provider]  HYDROcodone-acetaminophen (HYCET) 7.5-325 mg/15 ml solution Take 10-15 mLs by mouth 4 (four) times daily as needed for moderate  pain. Take with food. 12/21/22 12/21/23  Lonie Peak, MD  lidocaine (XYLOCAINE) 2 % solution Patient: Mix 1part 2% viscous lidocaine, 1part H20. Swallow 10mL of diluted mixture, before meals and at bedtime, up to QID to help sore throat 12/21/22   Lonie Peak, MD  lidocaine-prilocaine (EMLA) cream Apply to affected area once 08/04/22   Johney Maine, MD  lisinopril (ZESTRIL) 20 MG tablet TAKE 1 TABLET BY MOUTH EVERY DAY Patient taking differently: Take 20 mg by mouth every evening. 02/19/20   Shade Flood, MD  magic mouthwash w/lidocaine SOLN Take 5 mLs by mouth 4 (four) times daily as needed for mouth pain. Compound 400 mL with the following 80 ML of distilled water 80 mL of Maalox 80 mL of nystatin 500,000 units per 5 mL 80 mL of 2% viscous lidocaine 80 mL of Benadryl 12.5mg  per 5 mL 12/19/22   Johney Maine, MD  metFORMIN (GLUCOPHAGE) 500 MG tablet Take 500 mg by mouth 2 (two) times daily with a meal. 01/20/22   [provider]  Multiple Vitamins-Minerals (MULTI ADULT GUMMIES PO) Take 1 tablet by mouth in the morning. Centrum    [provider]  ondansetron (ZOFRAN) 8 MG tablet Take 1 tablet (8 mg total) by mouth every 8 (eight) hours as needed for nausea or vomiting. Start on the third day after carboplatin. 08/04/22   Candise Che,  Corene Cornea, MD  oxyCODONE (OXY IR/ROXICODONE) 5 MG immediate release tablet Take 1 tablet (5 mg total) by mouth every 6 (six) hours as needed for moderate pain or breakthrough pain. 07/22/22   Loreli Slot, MD  prochlorperazine (COMPAZINE) 10 MG tablet Take 1 tablet (10 mg total) by mouth every 6 (six) hours as needed for nausea or vomiting. 08/04/22   Johney Maine, MD  sucralfate (CARAFATE) 1 g tablet Take 1 tablet (1 g total) by mouth 2 (two) times daily. 12/19/22   Johney Maine, MD  tadalafil (CIALIS) 20 MG tablet Take 20 mg by mouth daily as needed for erectile dysfunction. 07/03/20   [provider]      Allergies    Bee venom, Antifungal [miconazole nitrate], and Zolpidem tartrate er    Review of Systems   Review of Systems  Physical Exam Updated Vital Signs BP (!) 132/93   Pulse (!) 103   Temp 98.4 F (36.9 C) (Oral)   Resp 16   SpO2 98%  Physical Exam Vitals and nursing note reviewed.  Constitutional:      General: He is not in acute distress.    Appearance: He is well-developed. He is not diaphoretic.  HENT:     Head: Normocephalic and atraumatic.     Comments: Swelling of the lips and bilateral cheeks.  No obvious signs of dental infection.  No swelling of the tongue or hypoglossal region.  Pharynx is without swelling.  Normal phonation, normal swallowing.  No stridor, no wheezing, no hives. Eyes:     General: No scleral icterus.    Extraocular Movements: Extraocular movements intact.     Conjunctiva/sclera: Conjunctivae normal.     Pupils: Pupils are equal, round, and reactive to light.  Cardiovascular:     Rate and Rhythm: Normal rate and regular rhythm.     Heart sounds: Normal heart sounds.  Pulmonary:     Effort: Pulmonary effort is normal. No respiratory distress.     Breath sounds: Normal breath sounds.  Abdominal:     Palpations: Abdomen is soft.     Tenderness: There is no abdominal tenderness.  Musculoskeletal:     Cervical back: Normal range of motion and neck supple.  Skin:    General: Skin is warm and dry.  Neurological:     Mental Status: He is alert.  Psychiatric:        Behavior: Behavior normal.     ED Results / Procedures / Treatments   Labs (all labs ordered are listed, but only abnormal results are displayed) Labs Reviewed - No data to display  EKG None  Radiology No results found.  Procedures Procedures    Medications Ordered in ED Medications - No data to display  ED Course/ Medical Decision Making/ A&P                                 Medical Decision Making  67 year old male who presents emergency department  with 2 days of swelling of his face and lips.  Given Benadryl and Solu-Medrol without improvement.  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  Patient is advised to discontinue this medication and follow-up with his PCP for new med.  He has no evidence of anaphylaxis.  No evidence of airway compromise no ENFit evidence of Ludwig's angina.  Appears appropriate for discharge with strict return precautions.        Final Clinical Impression(s) /  ED Diagnoses Final diagnoses:  None    Rx / DC Orders ED Discharge Orders     None         Arthor Captain, PA-C 01/08/23 1320    Tanda Rockers A, DO 01/09/23 1616

## 2023-01-08 NOTE — ED Provider Notes (Signed)
Patient presents with swelling to his lips that started a few days ago. He has not had any shortness of breath or difficulty swallowing. He has been having radiation daily.   Tomi Bamberger, PA-C 01/08/23 1600

## 2023-01-08 NOTE — ED Triage Notes (Signed)
Pt arrives pov, steady gait, endorses facial swelling x 2 days. Swelling to lips and bilateral cheeks noted. Currently receiving radiation for throat cancer. Referred by UC. Speaking in complete sentences, denies shob or increase in  difficulty swallowing. Endorses marijuana and alcohol x 3 days pta

## 2023-01-08 NOTE — Discharge Instructions (Signed)
1) stop taking your lisinopril immediately. 2) call your primary care doctor to get new medications for your blood pressure.  Contact a health care provider if: You continue to have repeated episodes of angioedema. Episodes of angioedema start to happen more often than they used to, even after you take steps to prevent them. You have episodes of angioedema that are more severe than they have been before, even after you take steps to prevent them. You are thinking about having children. Get help right away if: You have severe swelling of your mouth, tongue, or lips. Your swelling gets worse. You have trouble breathing, swallowing, or talking. You have chest pain, dizziness or light-headedness, or you pass out. These symptoms may represent a serious problem that is an emergency. Do not wait to see if the symptoms will go away. Get medical help right away. Call your local emergency services (911 in the [place] Do not drive yourself to the hospital.

## 2023-01-10 ENCOUNTER — Other Ambulatory Visit: Payer: Self-pay

## 2023-01-10 ENCOUNTER — Inpatient Hospital Stay (HOSPITAL_BASED_OUTPATIENT_CLINIC_OR_DEPARTMENT_OTHER): Payer: Medicare HMO | Admitting: Hematology

## 2023-01-10 ENCOUNTER — Ambulatory Visit (HOSPITAL_COMMUNITY): Admission: RE | Admit: 2023-01-10 | Payer: Medicare HMO | Source: Ambulatory Visit

## 2023-01-10 ENCOUNTER — Inpatient Hospital Stay: Payer: Medicare HMO

## 2023-01-10 ENCOUNTER — Encounter: Payer: Medicare HMO | Admitting: Dietician

## 2023-01-10 ENCOUNTER — Ambulatory Visit
Admission: RE | Admit: 2023-01-10 | Discharge: 2023-01-10 | Disposition: A | Discharge status: Home / Self Care | Payer: Medicare HMO | Source: Ambulatory Visit | Attending: Radiation Oncology | Admitting: Radiation Oncology

## 2023-01-10 ENCOUNTER — Encounter (HOSPITAL_COMMUNITY): Payer: Self-pay

## 2023-01-10 ENCOUNTER — Encounter (HOSPITAL_COMMUNITY): Payer: Medicare HMO

## 2023-01-10 DIAGNOSIS — Z87891 Personal history of nicotine dependence: Secondary | ICD-10-CM | POA: Insufficient documentation

## 2023-01-10 DIAGNOSIS — C771 Secondary and unspecified malignant neoplasm of intrathoracic lymph nodes: Secondary | ICD-10-CM | POA: Diagnosis not present

## 2023-01-10 DIAGNOSIS — Z7982 Long term (current) use of aspirin: Secondary | ICD-10-CM | POA: Insufficient documentation

## 2023-01-10 DIAGNOSIS — Z7189 Other specified counseling: Secondary | ICD-10-CM | POA: Diagnosis not present

## 2023-01-10 DIAGNOSIS — E860 Dehydration: Secondary | ICD-10-CM | POA: Insufficient documentation

## 2023-01-10 DIAGNOSIS — Z5112 Encounter for antineoplastic immunotherapy: Secondary | ICD-10-CM | POA: Insufficient documentation

## 2023-01-10 DIAGNOSIS — C801 Malignant (primary) neoplasm, unspecified: Secondary | ICD-10-CM | POA: Insufficient documentation

## 2023-01-10 DIAGNOSIS — Z510 Encounter for antineoplastic radiation therapy: Secondary | ICD-10-CM | POA: Diagnosis not present

## 2023-01-10 DIAGNOSIS — C320 Malignant neoplasm of glottis: Secondary | ICD-10-CM | POA: Insufficient documentation

## 2023-01-10 DIAGNOSIS — Z8572 Personal history of non-Hodgkin lymphomas: Secondary | ICD-10-CM | POA: Insufficient documentation

## 2023-01-10 DIAGNOSIS — Z79899 Other long term (current) drug therapy: Secondary | ICD-10-CM | POA: Insufficient documentation

## 2023-01-10 DIAGNOSIS — C770 Secondary and unspecified malignant neoplasm of lymph nodes of head, face and neck: Secondary | ICD-10-CM | POA: Insufficient documentation

## 2023-01-10 DIAGNOSIS — Z79620 Long term (current) use of immunosuppressive biologic: Secondary | ICD-10-CM | POA: Diagnosis not present

## 2023-01-10 DIAGNOSIS — C760 Malignant neoplasm of head, face and neck: Secondary | ICD-10-CM

## 2023-01-10 DIAGNOSIS — Z95828 Presence of other vascular implants and grafts: Secondary | ICD-10-CM

## 2023-01-10 LAB — CMP (CANCER CENTER ONLY)
ALT: 12 U/L (ref 0–44)
AST: 13 U/L — ABNORMAL LOW (ref 15–41)
Albumin: 4.2 g/dL (ref 3.5–5.0)
Alkaline Phosphatase: 74 U/L (ref 38–126)
Anion gap: 9 (ref 5–15)
BUN: 56 mg/dL — ABNORMAL HIGH (ref 8–23)
CO2: 25 mmol/L (ref 22–32)
Calcium: 10 mg/dL (ref 8.9–10.3)
Chloride: 99 mmol/L (ref 98–111)
Creatinine: 1.95 mg/dL — ABNORMAL HIGH (ref 0.61–1.24)
GFR, Estimated: 37 mL/min — ABNORMAL LOW (ref >60)
Glucose, Bld: 177 mg/dL — ABNORMAL HIGH (ref 70–99)
Potassium: 5 mmol/L (ref 3.5–5.1)
Sodium: 133 mmol/L — ABNORMAL LOW (ref 135–145)
Total Bilirubin: 0.4 mg/dL (ref 0.3–1.2)
Total Protein: 8.1 g/dL (ref 6.5–8.1)

## 2023-01-10 LAB — TSH: TSH: 1.907 u[IU]/mL (ref 0.350–4.500)

## 2023-01-10 LAB — RAD ONC ARIA SESSION SUMMARY
Course Elapsed Days: 42
Plan Fractions Treated to Date: 27
Plan Prescribed Dose Per Fraction: 2 Gy
Plan Total Fractions Prescribed: 30
Plan Total Prescribed Dose: 60 Gy
Reference Point Dosage Given to Date: 54 Gy
Reference Point Session Dosage Given: 2 Gy
Session Number: 27

## 2023-01-10 LAB — CBC WITH DIFFERENTIAL (CANCER CENTER ONLY)
Abs Immature Granulocytes: 0.03 10*3/uL (ref 0.00–0.07)
Basophils Absolute: 0 10*3/uL (ref 0.0–0.1)
Basophils Relative: 0 %
Eosinophils Absolute: 0 10*3/uL (ref 0.0–0.5)
Eosinophils Relative: 1 %
HCT: 24.7 % — ABNORMAL LOW (ref 39.0–52.0)
Hemoglobin: 8.2 g/dL — ABNORMAL LOW (ref 13.0–17.0)
Immature Granulocytes: 0 %
Lymphocytes Relative: 11 %
Lymphs Abs: 0.8 10*3/uL (ref 0.7–4.0)
MCH: 29.7 pg (ref 26.0–34.0)
MCHC: 33.2 g/dL (ref 30.0–36.0)
MCV: 89.5 fL (ref 80.0–100.0)
Monocytes Absolute: 0.7 10*3/uL (ref 0.1–1.0)
Monocytes Relative: 10 %
Neutro Abs: 5.3 10*3/uL (ref 1.7–7.7)
Neutrophils Relative %: 78 %
Platelet Count: 484 10*3/uL — ABNORMAL HIGH (ref 150–400)
RBC: 2.76 MIL/uL — ABNORMAL LOW (ref 4.22–5.81)
RDW: 13.8 % (ref 11.5–15.5)
WBC Count: 6.8 10*3/uL (ref 4.0–10.5)
nRBC: 0 % (ref 0.0–0.2)

## 2023-01-10 MED ORDER — SODIUM CHLORIDE 0.9% FLUSH
10.0000 mL | Freq: Once | INTRAVENOUS | Status: AC
  Administered 2023-01-10: 10 mL

## 2023-01-10 MED ORDER — HEPARIN SOD (PORK) LOCK FLUSH 100 UNIT/ML IV SOLN
500.0000 [IU] | Freq: Once | INTRAVENOUS | Status: AC
  Administered 2023-01-10: 500 [IU]

## 2023-01-10 NOTE — Progress Notes (Signed)
[facility]   [phone] [phone]   CLINIC Notes  Date of Service:  01/10/23  Patient Care Team: Noberto Retort, MD as PCP - General (Family Medicine) Wendall Stade, MD as PCP - Cardiology (Cardiology) Lonie Peak, MD as Consulting Physician (Radiation Oncology) Malmfelt, Lise Auer, RN as Oncology Nurse Navigator Osborn Coho, MD (Inactive) as Consulting Physician (Otolaryngology) Johney Maine, MD as Attending Physician (Hematology)    CHIEF COMPLAINTS/PURPOSE OF CONSULTATION:  F/u for continued evaluation and mx of 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  HISTORY OF PRESENTING ILLNESS:   Jesus [name] 67 y.o. male is here because of left lower extremity edema and lymphadenopathy.  The patient was seen in the emergency room this past Friday for the same issue.  A CT of the abdomen and pelvis was performed showing bulky left inguinal, left hemipelvic, and retroperitoneal adenopathy.  He was referred to [place] from the emergency room for further evaluation.  Doppler ultrasound of the left lower extremity was performed and was negative for DVT. Patient reports that he has been having left lower extremity edema in his left groin and left leg for approximately 1 month.  He states that the swelling in the left groin started to get better but then worsened.  The left lower extremity edema has slowly worsened over time.  Patient denies having fevers and chills.  He reports that he does have night sweats at times.  He reported having headaches approximate 1 [place]  He thinks his headaches are related to not having his blood pressure medication.  His headaches have now resolved.  He denies visual changes.  The patient denies chest pain, shortness of breath and cough.  No nausea, vomiting, constipation, diarrhea.  Denies abdominal pain.  The patient denies recent weight loss and has actually  gained weight recently.  Patient denies epistaxis, bleeding gums, hemoptysis, hematuria, but occasionally, and melena.  He reports increased urinary frequency over the past month but no dysuria.  The patient is here for evaluation and discussion of his recent CT and lab findings.  Interval History:  Jesus [name] 67 y.o. male who is here for continued evaluation and management of newly noted metastatic laryngeal squamous cell carcinoma. He is scheduled to start cycle 8 day 1 of Pembrolizumab on 01/11/2023.   Patient was last seen by me 12/19/2022 and he complained of appetite loss and weight loss due to throat pain.  Patient notes he has been doing fairly well since our last visit. He notes he had a bad toxicity with lisinopril since our last visit and has discontinued it.    Patient has lost around 100 lbs due to appetite loss. He notes his taste has been bothering him more than usual. He has been drinking around 80 oz a day and has been drinking ensure supplement. He has been drinking around 5 ensure supplement a day. Patient has been working with the Nutritionist.    He notes his last radiation treatment is end of this week. He notes his swelling has improved.   He denies any new infection issues, fever, chills, night sweats, SOB, fatigue, chest pain, back pain, abnormal bleeding, black stool, hematuria, or leg swelling. He denies throat pain. He does report of one episode of gum bleed.   Patient has been tolerating his treatment well without any new or severe toxicities.    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- last radiation 06-2018   Cataract    removed both eyes with l;ens implants    COPD (chronic obstructive pulmonary disease) (HCC)    Family history of colon  cancer    in his brother- dx'd age 55    History of chemotherapy    last 06-01-2018   History of kidney stones    History of radiation therapy    last radiation 06-2018   Hyperlipidemia    currently under control   Hypertension    Irregular heart beats    Lymphadenopathy    Pain, lower leg    Bilateral   Peripheral arterial disease (HCC)    Pre-diabetes    Red-green color blindness    RLS (restless legs syndrome)    Snores    Wears glasses     SURGICAL HISTORY: Past Surgical History:  Procedure Laterality Date   CATARACT EXTRACTION W/ INTRAOCULAR LENS  IMPLANT, BILATERAL     COLONOSCOPY     DIRECT LARYNGOSCOPY Right 02/03/2022   Procedure: DIRECT LARYNGOSCOPY WITH BIOPSY OF RIGHT FALSE VOCAL CORD;  Surgeon: Osborn Coho, MD;  Location: [facility] OR;  Service: ENT;  Laterality: Right;   dislodged salava stone     FRACTURE SURGERY     HAND ARTHROPLASTY  1995   crushed left hand   INGUINAL LYMPH NODE BIOPSY Left 01/02/2018   Procedure: LEFT INGUINAL LYMPH NODE BIOPSY;  Surgeon: Emelia Loron, MD;  Location: [facility] OR;  Service: General;  Laterality: Left;   IR IMAGING GUIDED PORT INSERTION  01/15/2018   IR IMAGING GUIDED PORT INSERTION  08/10/2022   IR REMOVAL TUN ACCESS W/ PORT W/O FL MOD SED  03/11/2019   MEDIASTINOSCOPY N/A 07/22/2022   Procedure: MEDIASTINOSCOPY;  Surgeon: Loreli Slot, MD;  Location: [facility] OR;  Service: Thoracic;  Laterality: N/A;   MICROLARYNGOSCOPY Left 01/17/2014   Procedure: MICROLARYNGOSCOPY WITH EXCISION OF THE BIOPSY OF LEFT VOCAL CORD LESION;  Surgeon: Serena Colonel, MD;  Location: [facility];  Service: ENT;  Laterality: Left;   MICROLARYNGOSCOPY N/A 09/16/2020   Procedure: MICROLARYNGOSCOPY with Biopsy of vocal cord lesion;  Surgeon: Osborn Coho, MD;  Location: [facility] OR;  Service: ENT;  Laterality: N/A;   ORIF FOOT FRACTURE  2005   left   REFRACTIVE SURGERY Right    removed cloudiness in right eye after cataract removal     SOCIAL  HISTORY: Social History   Socioeconomic History   Marital status: Divorced    Spouse name: Not on file   Number of children: 3   Years of education: Not on file   Highest education level: Not on file  Occupational History   Not on file  Tobacco Use   Smoking status: Former    Current packs/day: 0.00    Average packs/day: 0.5 packs/day for 36.0 years (18.0 ttl pk-yrs)    Types: Cigarettes    Start date: 09/28/1984    Quit date: 09/28/2020    Years since quitting: 2.2   Smokeless tobacco: Never   Tobacco comments:    he denies smoking in about 2 weeks 07/13/18  Vaping Use   Vaping status: Never Used  Substance and Sexual Activity   Alcohol use: Yes  Alcohol/week: 6.0 standard drinks of alcohol    Types: 6 Cans of beer per week    Comment: Last use: Friday 30th   Drug use: Yes    Types: Marijuana, Cocaine    Comment: Last [facility] Use: Friday 30th.   Sexual activity: Not Currently  Other Topics Concern   Not on file  Social History Narrative   Not on file   Social Determinants of Health   Financial Resource Strain: Low Risk  (10/06/2020)   Overall Financial Resource Strain (CARDIA)    Difficulty of Paying Living Expenses: Not very hard  Food Insecurity: No Food Insecurity (11/09/2022)   Hunger Vital Sign    Worried About Running Out of Food in the Last Year: Never true    Ran Out of Food in the Last Year: Never true  Transportation Needs: No Transportation Needs (11/09/2022)   PRAPARE - Administrator, Civil Service (Medical): No    Lack of Transportation (Non-Medical): No  Physical Activity: Not on file  Stress: Not on file  Social Connections: Moderately Integrated (10/06/2020)   Social Connection and Isolation Panel [NHANES]    Frequency of Communication with Friends and Family: Three times a week    Frequency of Social Gatherings with Friends and Family: Three times a week    Attends Religious Services: 1 to 4 times per year    Active Member of Clubs or  Organizations: Yes    Attends Banker Meetings: 1 to 4 times per year    Marital Status: Divorced  Intimate Partner Violence: Not At Risk (11/09/2022)   Humiliation, Afraid, Rape, and Kick questionnaire    Fear of Current or Ex-Partner: No    Emotionally Abused: No    Physically Abused: No    Sexually Abused: No    FAMILY HISTORY: Family History  Problem Relation Age of Onset   Breast cancer Mother    Diabetes Father    Hypertension Father    Stroke Father    Mental illness Sister    Hypertension Daughter    Mental illness Daughter    Hypertension Brother    Colon cancer Brother 41       passed away 12/11/18   Breast cancer Sister    Esophageal cancer Neg Hx    Colon polyps Neg Hx    Rectal cancer Neg Hx    Stomach cancer Neg Hx     ALLERGIES:  is allergic to bee venom, antifungal [miconazole nitrate], and zolpidem tartrate er.  MEDICATIONS:  Current Outpatient Medications  Medication Sig Dispense Refill   ACCU-CHEK GUIDE test strip 1 each by Other route as needed for other.     Accu-Chek Softclix Lancets lancets      acetaminophen (TYLENOL) 500 MG tablet Take 1,500 mg by mouth daily as needed for moderate pain.     amLODipine (NORVASC) 10 MG tablet TAKE 1 TABLET BY MOUTH EVERY DAY (Patient taking differently: Take 10 mg by mouth every evening.) 30 tablet 0   aspirin EC 81 MG tablet Take 81 mg by mouth every evening. Swallow whole.     b complex vitamins capsule Take 1 capsule by mouth daily.     HYDROcodone-acetaminophen (HYCET) 7.5-325 mg/15 ml solution Take 10-15 mLs by mouth 4 (four) times daily as needed for moderate pain. Take with food. 300 mL 0   lidocaine (XYLOCAINE) 2 % solution Patient: Mix 1part 2% viscous lidocaine, 1part H20. Swallow 10mL of diluted mixture, before meals and at  bedtime, up to QID to help sore throat 200 mL 3   lidocaine-prilocaine (EMLA) cream Apply to affected area once 30 g 3   lisinopril (ZESTRIL) 20 MG tablet TAKE 1 TABLET  BY MOUTH EVERY DAY (Patient taking differently: Take 20 mg by mouth every evening.) 30 tablet 0   magic mouthwash w/lidocaine SOLN Take 5 mLs by mouth 4 (four) times daily as needed for mouth pain. Compound 400 mL with the following 80 ML of distilled water 80 mL of Maalox 80 mL of nystatin 500,000 units per 5 mL 80 mL of 2% viscous lidocaine 80 mL of Benadryl 12.5mg  per 5 mL 400 mL 0   metFORMIN (GLUCOPHAGE) 500 MG tablet Take 500 mg by mouth 2 (two) times daily with a meal.     Multiple Vitamins-Minerals (MULTI ADULT GUMMIES PO) Take 1 tablet by mouth in the morning. Centrum     ondansetron (ZOFRAN) 8 MG tablet Take 1 tablet (8 mg total) by mouth every 8 (eight) hours as needed for nausea or vomiting. Start on the third day after carboplatin. 30 tablet 1   oxyCODONE (OXY IR/ROXICODONE) 5 MG immediate release tablet Take 1 tablet (5 mg total) by mouth every 6 (six) hours as needed for moderate pain or breakthrough pain. 20 tablet 0   prochlorperazine (COMPAZINE) 10 MG tablet Take 1 tablet (10 mg total) by mouth every 6 (six) hours as needed for nausea or vomiting. 30 tablet 1   sucralfate (CARAFATE) 1 g tablet Take 1 tablet (1 g total) by mouth 2 (two) times daily. 60 tablet 0   tadalafil (CIALIS) 20 MG tablet Take 20 mg by mouth daily as needed for erectile dysfunction.     No current facility-administered medications for this visit.    REVIEW OF SYSTEMS:   10 Point review of Systems was done is negative except as noted above.  PHYSICAL EXAMINATION: .BP 126/83   Pulse (!) 56   Temp 98.6 F (37 C)   Resp 20   Wt 296 lb 9.6 oz (134.5 kg)   SpO2 100%   BMI 40.23 kg/m  . GENERAL:alert, in no acute distress and comfortable SKIN: no acute rashes, no significant lesions EYES: conjunctiva are pink and non-injected, sclera anicteric OROPHARYNX: MMM, no exudates, no oropharyngeal erythema or ulceration NECK: supple, no JVD LYMPH:  no palpable lymphadenopathy in the cervical, axillary or  inguinal regions LUNGS: clear to auscultation b/l with normal respiratory effort HEART: regular rate & rhythm ABDOMEN:  normoactive bowel sounds , non tender, not distended. Extremity: no pedal edema PSYCH: alert & oriented x 3 with fluent speech NEURO: no focal motor/sensory deficits   LABORATORY DATA:   I have reviewed the data as listed    Latest Ref Rng & Units 01/10/2023   12:16 PM 12/19/2022   11:37 AM 11/28/2022   12:13 PM  CBC  WBC 4.0 - 10.5 K/uL 6.8  5.5  6.7   Hemoglobin 13.0 - 17.0 g/dL 8.2  9.7  65.7   Hematocrit 39.0 - 52.0 % 24.7  28.0  31.9   Platelets 150 - 400 K/uL 484  348  362    CBC    Component Value Date/Time   WBC 6.8 01/10/2023 1216   WBC 6.5 07/19/2022 1400   RBC 2.76 (L) 01/10/2023 1216   HGB 8.2 (L) 01/10/2023 1216   HCT 24.7 (L) 01/10/2023 1216   PLT 484 (H) 01/10/2023 1216   MCV 89.5 01/10/2023 1216   MCV 87.3 12/22/2017 1518  MCH 29.7 01/10/2023 1216   MCHC 33.2 01/10/2023 1216   RDW 13.8 01/10/2023 1216   LYMPHSABS 0.8 01/10/2023 1216   MONOABS 0.7 01/10/2023 1216   EOSABS 0.0 01/10/2023 1216   BASOSABS 0.0 01/10/2023 1216       Latest Ref Rng & Units 01/10/2023   12:16 PM 12/19/2022   11:37 AM 11/28/2022   12:13 PM  CMP  Glucose 70 - 99 mg/dL 295  621  308   BUN 8 - 23 mg/dL 56  17  12   Creatinine 0.61 - 1.24 mg/dL 6.57  8.46  9.62   Sodium 135 - 145 mmol/L 133  136  139   Potassium 3.5 - 5.1 mmol/L 5.0  4.5  4.1   Chloride 98 - 111 mmol/L 99  99  105   CO2 22 - 32 mmol/L 25  29  28    Calcium 8.9 - 10.3 mg/dL 95.2  9.6  9.5   Total Protein 6.5 - 8.1 g/dL 8.1  7.8  7.0   Total Bilirubin 0.3 - 1.2 mg/dL 0.4  0.4  0.5   Alkaline Phos 38 - 126 U/L 74  66  66   AST 15 - 41 U/L 13  11  11    ALT 0 - 44 U/L 12  7  7     . Lab Results  Component Value Date   LDH 168 05/18/2022   Tsh 1.42 (05/21/2021)   01/02/18 Left Inguinal LN Bx:   12/26/17 Tissue Flow Cytometry:   12/26/17 Inguinal Core biopsy:      RADIOGRAPHIC  STUDIES: I have personally reviewed the radiological images as listed and agreed with the findings in the report. No results found.  ASSESSMENT & PLAN:   This is a pleasant 67 y.o. 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   CT of the abdomen and pelvis performed on 12/22/2017 showed bulky left inguinal, left hemipelvic, and  retroperitoneal adenopathy.     01/02/18 Left inguinal LN Biopsy revealed T-Cell/histocyte rich Large B-Cell Lymphoma   12/27/17 ECHO revealed LV EF of 55-60%    01/05/18 PET/CT revealed Massively enlarged pelvic lymph nodes intense metabolic activity consistent lymphoma. 2. Additional hypermetabolic lymph nodes in the porta hepatis and retroperitoneum LEFT aorta. 3. Solitary hypermetabolic mediastinal lymph node in the upper LEFT Mediastinum. 4. 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 5. Normal spleen.     HIV non reactive on 12/22/2017.  Hep C and hep B serology negative.   03/14/18 PET/CT revealed PET-CT findings suggest an excellent response to chemotherapy. The abdominal lymphadenopathy has near completely resolved and demonstrates a near complete metabolic response. The pelvic and inguinal adenopathy has significantly decreased in size and the metabolic activity has significantly decreased. 2. Diffuse marrow activity likely due to chemotherapy and or marrow stimulating drugs. I do not see any discrete persistent lesions.    04/17/18 CT Head revealed Subtle mesial caudothalamic hypodensities may be artifact though, the could reflect encephalitis or Wernicke's encephalopathy. Consider MRI of the head with and without contrast. 2. Mild chronic small vessel ischemic  changes    S/p 6 cycles of EPOCH-R completed on 06/01/18  06/28/18 PET/CT revealed Continued good response to treatment. No residual measurable or hypermetabolic abdominal lymphadenopathy and no recurrent osseous disease. 2. Interval  decrease in size of the left operator region lymph node and also the left inguinal lymph node. However, the both have small foci of slightly increased hypermetabolism which bears surveillance. 3. No new or progressive lymphadenopathy in the neck, chest, abdomen or pelvis.  S/p 39.6 Gy in 22 fractions between 07/25/18 and 08/23/18  02/13/2019 CT C/A/P ([id number]) ([id number]) revealed "1. Response to therapy of pelvic adenopathy compared to the PET of 06/28/2018. 2. No new or progressive disease. 3. Mild prostatomegaly. 4. 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 5. Hepatomegaly."   2) left lower extremity swelling- now resolved  Doppler ultrasound for DVT was negative in the left lower extremity.   Likely from venous compression +/- lymphatic obstruction from bulky left inguinal, left hemipelvic, and  retroperitoneal adenopathy.    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  6) Radiation mucositis  PLAN: -Discussed lab results from today, 01/10/2023, with the patient. CBC shows patient is anemic with decreased hemoglobin of 8.2 g/dL, decreased hematocrit of 24.7%, and elevated platelet at 484 K. CMP shows elevated blood glucose level at 177, elevated BUN of 56, elevated creatinine of 1.95, and slightly decreased sodium level of 133. Patient is  dehydrated.  -Patient will be getting IV fluid due to the patient being dehydrated. Patient agrees.  -Patient will get IV fluid tomorrow.  -Patient has been tolerating his treatment well without any new or severe toxicities.  -Patient will continue to receive immunotherapy (Pembrolizumab) without any dose modification.  -continue f/u with rad onc to complete palliative EBRT -Advised patient to eat well and stay well hydrated.   FOLLOW-UP: IV NS three times weekly (1L NS) on Monday/Wednesday/Friday MD visit with Dr Candise Che with portflush and labs in 2 weeks  The total time spent in the appointment was 30 minutes* .  All of the patient's questions were answered with apparent satisfaction. The patient knows to call the clinic with any problems, questions or concerns.   Wyvonnia Lora MD MS AAHIVMS [facility] Hematology/Oncology Physician [facility]  .*Total Encounter Time as defined by the Centers for Medicare and Medicaid Services includes, in addition to the face-to-face time of a patient visit (documented in the note above) non-face-to-face time: obtaining and reviewing outside history, ordering and reviewing medications, tests or procedures, care coordination (communications with other health care professionals or caregivers) and documentation in the medical record.   I,Param Shah,acting as a Neurosurgeon for Wyvonnia Lora, MD.,have documented all relevant documentation on the behalf of Wyvonnia Lora, MD,as directed by  Wyvonnia Lora, MD while in the presence of Wyvonnia Lora, MD.   .I have reviewed the above documentation for accuracy and completeness, and I agree with the above. Johney Maine MD

## 2023-01-11 ENCOUNTER — Inpatient Hospital Stay: Payer: Medicare HMO

## 2023-01-11 ENCOUNTER — Other Ambulatory Visit: Payer: Self-pay

## 2023-01-11 ENCOUNTER — Inpatient Hospital Stay: Payer: Medicare HMO | Admitting: Dietician

## 2023-01-11 ENCOUNTER — Ambulatory Visit: Payer: Medicare HMO | Admitting: Physician Assistant

## 2023-01-11 ENCOUNTER — Ambulatory Visit
Admission: RE | Admit: 2023-01-11 | Discharge: 2023-01-11 | Disposition: A | Discharge status: Home / Self Care | Payer: Medicare HMO | Source: Ambulatory Visit | Attending: Radiation Oncology | Admitting: Radiation Oncology

## 2023-01-11 VITALS — BP 109/65 | HR 99 | Temp 97.9°F | Resp 18

## 2023-01-11 DIAGNOSIS — Z7189 Other specified counseling: Secondary | ICD-10-CM

## 2023-01-11 DIAGNOSIS — C760 Malignant neoplasm of head, face and neck: Secondary | ICD-10-CM

## 2023-01-11 DIAGNOSIS — Z510 Encounter for antineoplastic radiation therapy: Secondary | ICD-10-CM | POA: Diagnosis not present

## 2023-01-11 DIAGNOSIS — C320 Malignant neoplasm of glottis: Secondary | ICD-10-CM | POA: Diagnosis not present

## 2023-01-11 DIAGNOSIS — C801 Malignant (primary) neoplasm, unspecified: Secondary | ICD-10-CM | POA: Diagnosis not present

## 2023-01-11 DIAGNOSIS — C771 Secondary and unspecified malignant neoplasm of intrathoracic lymph nodes: Secondary | ICD-10-CM | POA: Diagnosis not present

## 2023-01-11 DIAGNOSIS — C770 Secondary and unspecified malignant neoplasm of lymph nodes of head, face and neck: Secondary | ICD-10-CM | POA: Diagnosis not present

## 2023-01-11 DIAGNOSIS — Z79620 Long term (current) use of immunosuppressive biologic: Secondary | ICD-10-CM | POA: Diagnosis not present

## 2023-01-11 LAB — RAD ONC ARIA SESSION SUMMARY
Course Elapsed Days: 43
Plan Fractions Treated to Date: 28
Plan Prescribed Dose Per Fraction: 2 Gy
Plan Total Fractions Prescribed: 30
Plan Total Prescribed Dose: 60 Gy
Reference Point Dosage Given to Date: 56 Gy
Reference Point Session Dosage Given: 2 Gy
Session Number: 28

## 2023-01-11 LAB — T4: T4, Total: 9.2 ug/dL (ref 4.5–12.0)

## 2023-01-11 MED ORDER — SODIUM CHLORIDE 0.9 % IV SOLN
200.0000 mg | Freq: Once | INTRAVENOUS | Status: AC
  Administered 2023-01-11: 200 mg via INTRAVENOUS
  Filled 2023-01-11: qty 200

## 2023-01-11 MED ORDER — SODIUM CHLORIDE 0.9 % IV SOLN: Freq: Once | INTRAVENOUS | Status: AC

## 2023-01-11 MED ORDER — HEPARIN SOD (PORK) LOCK FLUSH 100 UNIT/ML IV SOLN
500.0000 [IU] | Freq: Once | INTRAVENOUS | Status: AC | PRN
  Administered 2023-01-11: 500 [IU]

## 2023-01-11 MED ORDER — SODIUM CHLORIDE 0.9% FLUSH
10.0000 mL | INTRAVENOUS | Status: DC | PRN
  Administered 2023-01-11: 10 mL

## 2023-01-11 NOTE — Progress Notes (Signed)
Nutrition Follow-up:  Pt with history of laryngeal cancer as well as lymphoma. S/p radiation therapy for both. Now with SCC of right neck and mediastinum, progressive on systemic therapy. He is receiving radiotherapy concurrent with [place] (final RT planned 9/6).   Noted pt seen in ED - lisinopril induced angioedema  Met with pt in infusion. He reports appetite has improved since Sunday. Recalls eating 4 bowls of neck bone soup yesterday. Pt says he worked in his yard over the holiday weekend and over did it. Pt reports sweating a lot and did not drink enough water. He reports drinking 3 Gatorades yesterday. Pt looking forward to eating lots of good food at family reunion this weekend.    Medications: reviewed  Labs: Na 133, glucose 177, BUN 56, Cr 1.95  Anthropometrics: Last wt (aria) 268.2 lb on 9/3 - decreased   8/26 - 272 lb  8/19 - 276.2 lb 8/12 - 273 lb 12 oz  Estimated Energy Needs   Kcals: 3100-3350 Protein: 150-175 Fluid: >/= 3.1 L   NUTRITION DIAGNOSIS: Inadequate oral intake - improving  INTERVENTION:  Continue oral intake of high calorie high protein foods Continue drinking Ensure Complete/equivalent 4/day - samples provided Continue baking soda salt water rinses    MONITORING, EVALUATION, GOAL: wt trends, intake   NEXT VISIT: Friday September 20 after MD

## 2023-01-12 ENCOUNTER — Ambulatory Visit: Payer: Medicare HMO

## 2023-01-12 ENCOUNTER — Ambulatory Visit
Admission: RE | Admit: 2023-01-12 | Discharge: 2023-01-12 | Disposition: A | Discharge status: Home / Self Care | Payer: Medicare HMO | Source: Ambulatory Visit | Attending: Radiation Oncology | Admitting: Radiation Oncology

## 2023-01-12 ENCOUNTER — Other Ambulatory Visit: Payer: Self-pay

## 2023-01-12 DIAGNOSIS — C320 Malignant neoplasm of glottis: Secondary | ICD-10-CM | POA: Diagnosis not present

## 2023-01-12 DIAGNOSIS — Z79620 Long term (current) use of immunosuppressive biologic: Secondary | ICD-10-CM | POA: Diagnosis not present

## 2023-01-12 DIAGNOSIS — C771 Secondary and unspecified malignant neoplasm of intrathoracic lymph nodes: Secondary | ICD-10-CM | POA: Diagnosis not present

## 2023-01-12 DIAGNOSIS — C770 Secondary and unspecified malignant neoplasm of lymph nodes of head, face and neck: Secondary | ICD-10-CM | POA: Diagnosis not present

## 2023-01-12 DIAGNOSIS — Z510 Encounter for antineoplastic radiation therapy: Secondary | ICD-10-CM | POA: Diagnosis not present

## 2023-01-12 DIAGNOSIS — C801 Malignant (primary) neoplasm, unspecified: Secondary | ICD-10-CM | POA: Diagnosis not present

## 2023-01-12 LAB — RAD ONC ARIA SESSION SUMMARY
Course Elapsed Days: 44
Plan Fractions Treated to Date: 29
Plan Prescribed Dose Per Fraction: 2 Gy
Plan Total Fractions Prescribed: 30
Plan Total Prescribed Dose: 60 Gy
Reference Point Dosage Given to Date: 58 Gy
Reference Point Session Dosage Given: 2 Gy
Session Number: 29

## 2023-01-13 ENCOUNTER — Ambulatory Visit: Payer: Medicare HMO

## 2023-01-13 ENCOUNTER — Other Ambulatory Visit: Payer: Self-pay

## 2023-01-13 ENCOUNTER — Ambulatory Visit
Admission: RE | Admit: 2023-01-13 | Discharge: 2023-01-13 | Disposition: A | Discharge status: Home / Self Care | Payer: Medicare HMO | Source: Ambulatory Visit | Attending: Radiation Oncology | Admitting: Radiation Oncology

## 2023-01-13 ENCOUNTER — Encounter: Payer: Self-pay | Admitting: Hematology

## 2023-01-13 DIAGNOSIS — C770 Secondary and unspecified malignant neoplasm of lymph nodes of head, face and neck: Secondary | ICD-10-CM | POA: Diagnosis not present

## 2023-01-13 DIAGNOSIS — C801 Malignant (primary) neoplasm, unspecified: Secondary | ICD-10-CM | POA: Diagnosis not present

## 2023-01-13 DIAGNOSIS — Z510 Encounter for antineoplastic radiation therapy: Secondary | ICD-10-CM | POA: Diagnosis not present

## 2023-01-13 DIAGNOSIS — C320 Malignant neoplasm of glottis: Secondary | ICD-10-CM | POA: Diagnosis not present

## 2023-01-13 DIAGNOSIS — Z79620 Long term (current) use of immunosuppressive biologic: Secondary | ICD-10-CM | POA: Diagnosis not present

## 2023-01-13 DIAGNOSIS — C771 Secondary and unspecified malignant neoplasm of intrathoracic lymph nodes: Secondary | ICD-10-CM | POA: Diagnosis not present

## 2023-01-13 LAB — RAD ONC ARIA SESSION SUMMARY
Course Elapsed Days: 45
Plan Fractions Treated to Date: 30
Plan Prescribed Dose Per Fraction: 2 Gy
Plan Total Fractions Prescribed: 30
Plan Total Prescribed Dose: 60 Gy
Reference Point Dosage Given to Date: 60 Gy
Reference Point Session Dosage Given: 2 Gy
Session Number: 30

## 2023-01-13 NOTE — Progress Notes (Signed)
Jesus [name] presents to clinic today for follow up after completing radiation therapy for neck cancer (lymph nodes). He completed treatment on 01-13-23.   Pain issues, if any: denies throat pain but still feels odd per report, voice is also still hoarse at time Using a feeding tube?: none Weight changes, if any:  Wt Readings from Last 3 Encounters:  01/27/23 262 lb 2 oz (118.9 kg)  01/10/23 296 lb 9.6 oz (134.5 kg)  01/08/23 270 lb (122.5 kg)  Actively trying to eat better and increase solid food intake. Working with nutrition on increasing intake.   Swallowing issues, if any: swallowing pills is difficult Smoking or chewing tobacco? Does not use Using fluoride toothpaste daily? yes Last ENT visit was on: none since treatment ended  Other notable issues, if any: Pt wants to know his progress and how he is doing overall. He reports his sense of taste is improving.   Vitals:   01/27/23 1114  BP: 114/68  Pulse: (!) 108  Resp: 18  Temp: (!) 97.5 F (36.4 C)  SpO2: 100%

## 2023-01-13 NOTE — Progress Notes (Signed)
Oncology Nurse Navigator Documentation   Met with Jesus [name] after final RT to offer support and to celebrate end of radiation treatment.   Explained my role as navigator will continue for several more months, encouraged him to call me with needs/concerns.    Hedda Slade RN, BSN, OCN Head & Neck Oncology Nurse Navigator [facility] Phone # [phone]  Fax # [phone]

## 2023-01-16 ENCOUNTER — Ambulatory Visit: Payer: Medicare HMO

## 2023-01-16 ENCOUNTER — Encounter: Payer: Self-pay | Admitting: Hematology

## 2023-01-16 ENCOUNTER — Telehealth (HOSPITAL_COMMUNITY): Payer: Self-pay | Admitting: *Deleted

## 2023-01-16 NOTE — Radiation Completion Notes (Signed)
Patient Name: Jesus [name], Jesus [name] MRN: [id number] Date of Birth: 08/09/1955 Referring Physician: Wyvonnia Lora, M.D. Date of Service: 2023-01-16 Radiation Oncologist: Lonie Peak, M.D.  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, N=cN0, M=cM0 Intent: Curative     ==========DELIVERED PLANS==========  First Treatment Date: 2022-11-29 - Last Treatment Date: 2023-01-13   Plan Name: [id number] Site: Neck Technique: IMRT Mode: Photon Dose Per Fraction: 2 Gy Prescribed Dose (Delivered / Prescribed): 60 Gy / 60 Gy Prescribed Fxs (Delivered / Prescribed): 30 / 30     ==========ON TREATMENT VISIT DATES========== 2022-12-06, 2022-12-09, 2022-12-19, 2022-12-26, 2023-01-02, 2023-01-10     ==========UPCOMING VISITS==========       ==========APPENDIX - ON TREATMENT VISIT NOTES==========   See weekly On Treatment Notes in Epic for details.

## 2023-01-16 NOTE — Telephone Encounter (Signed)
Attempted to contact patient to reschedule missed appointment for OP MBS. Left VM. RKEEL

## 2023-01-17 ENCOUNTER — Ambulatory Visit: Payer: Medicare HMO

## 2023-01-17 DIAGNOSIS — U071 COVID-19: Secondary | ICD-10-CM | POA: Diagnosis not present

## 2023-01-17 DIAGNOSIS — J069 Acute upper respiratory infection, unspecified: Secondary | ICD-10-CM | POA: Diagnosis not present

## 2023-01-25 ENCOUNTER — Ambulatory Visit: Payer: Medicare HMO | Attending: Radiation Oncology

## 2023-01-25 DIAGNOSIS — R1310 Dysphagia, unspecified: Secondary | ICD-10-CM | POA: Diagnosis not present

## 2023-01-25 DIAGNOSIS — R1313 Dysphagia, pharyngeal phase: Secondary | ICD-10-CM | POA: Diagnosis not present

## 2023-01-25 NOTE — Therapy (Signed)
OUTPATIENT SPEECH LANGUAGE PATHOLOGY ONCOLOGY EVALUATION   Patient Name: Jesus [name] MRN: [id number] DOB:11/10/1955, 67 y.o., male Today's Date: 01/25/2023  PCP: Noberto Retort REFERRING PROVIDER: Lonie Peak, MD  END OF SESSION:  End of Session - 01/25/23 1243     Visit Number 2    Number of Visits 7    Date for SLP Re-Evaluation 03/22/23    SLP Start Time 1242   pt arrived 1241   SLP Stop Time  1315    SLP Time Calculation (min) 33 min    Activity Tolerance Patient tolerated treatment well             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- last radiation 06-2018   Cataract    removed both eyes with l;ens implants    COPD (chronic obstructive pulmonary disease) (HCC)    Family history of colon cancer    in his brother- dx'd age 57    History of chemotherapy    last 06-01-2018   History of kidney stones    History of radiation therapy    last radiation 06-2018   Hyperlipidemia    currently under control   Hypertension    Irregular heart beats    Lymphadenopathy    Pain, lower leg    Bilateral   Peripheral arterial disease (HCC)    Pre-diabetes    Red-green color blindness    RLS (restless legs syndrome)    Snores    Wears glasses    Past Surgical History:  Procedure Laterality Date   CATARACT EXTRACTION W/ INTRAOCULAR LENS  IMPLANT, BILATERAL     COLONOSCOPY     DIRECT LARYNGOSCOPY Right 02/03/2022   Procedure: DIRECT LARYNGOSCOPY WITH BIOPSY OF RIGHT FALSE VOCAL CORD;  Surgeon: Osborn Coho, MD;  Location: [facility] OR;  Service: ENT;  Laterality: Right;   dislodged salava stone     FRACTURE SURGERY     HAND ARTHROPLASTY  1995   crushed left hand   INGUINAL LYMPH NODE BIOPSY Left 01/02/2018   Procedure: LEFT INGUINAL LYMPH NODE BIOPSY;  Surgeon:  Emelia Loron, MD;  Location: [facility] OR;  Service: General;  Laterality: Left;   IR IMAGING GUIDED PORT INSERTION  01/15/2018   IR IMAGING GUIDED PORT INSERTION  08/10/2022   IR REMOVAL TUN ACCESS W/ PORT W/O FL MOD SED  03/11/2019   MEDIASTINOSCOPY N/A 07/22/2022   Procedure: MEDIASTINOSCOPY;  Surgeon: Loreli Slot, MD;  Location: [facility] OR;  Service: Thoracic;  Laterality: N/A;   MICROLARYNGOSCOPY Left 01/17/2014   Procedure: MICROLARYNGOSCOPY WITH EXCISION OF THE BIOPSY OF LEFT VOCAL CORD LESION;  Surgeon: Serena Colonel, MD;  Location: [facility];  Service: ENT;  Laterality: Left;   MICROLARYNGOSCOPY N/A 09/16/2020   Procedure: MICROLARYNGOSCOPY with Biopsy of vocal cord lesion;  Surgeon: Osborn Coho, MD;  Location: [facility] OR;  Service: ENT;  Laterality: N/A;   ORIF FOOT FRACTURE  2005   left   REFRACTIVE SURGERY Right    removed cloudiness in right eye after cataract removal    Patient Active Problem List   Diagnosis Date Noted   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   Pain due to onychomycosis of toenail 10/25/2022  Port-A-Cath in place 09/05/2022   Head and neck cancer (HCC) 08/04/2022   Chronic pain 07/15/2021   Diabetes mellitus without complication (HCC) 07/15/2021   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   Gastroesophageal reflux disease without esophagitis 07/15/2021   History of lymphoma 07/15/2021   Lipoma of skin and subcutaneous tissue of trunk 07/15/2021   Mixed hyperlipidemia 07/15/2021   Prediabetes 07/15/2021   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   Tobacco dependence 07/15/2021   Trigger thumb of left hand 02/17/2021   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   Hoarseness 08/11/2020   Chronic pain of right knee 10/17/2018   Large cell (diffuse) non-Hodgkin's lymphoma (HCC) 05/10/2018   Bacteremia due to Escherichia coli    HTN  (hypertension) 04/17/2018   RLS (restless legs syndrome) 04/17/2018   Abnormal LFTs 04/17/2018   Acute metabolic encephalopathy 04/16/2018   AKI (acute kidney injury) (HCC) 04/16/2018   Dehydration    Fever, unspecified    Sepsis (HCC)    Disorientation    Non-Hodgkin's lymphoma of skin (HCC) 04/03/2018   Hypokalemia    Hypomagnesemia    Anemia    Encounter for antineoplastic chemotherapy    At high risk of tumor lysis syndrome    Swelling of lower leg    Diffuse large B cell lymphoma (HCC) 01/15/2018   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HCC) 01/12/2018   Counseling regarding advance care planning and goals of care 01/12/2018   Bilateral leg pain 05/27/2014    ONSET DATE: see "pertinent details"   REFERRING DIAG: 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 He presented to Dr. Basilio Cairo on 11/11/22 to discuss management of nodal metastases from a laryngeal primary referred by Dr. Candise Che. To review from last visit on 06/01/22 the patient had recently undergone a right neck lymph node biopsy on 05/25/22 which showed findings consistent with metastatic keratinizing squamous cell carcinoma. Biopsy was indicated by new right neck and mediastinal adenopathy which he first noticed in December 2023. (He soon after presented for an MRI of the brain on 06/02/22 which showed no intracranial abnormalities). Based on biopsy results, the patient followed up with Dr. Candise Che who recommended proceeding with a PET scan. Subsequent PET scan on 06/08/22 demonstrated: moderate  hypermetabolism associated with the right cervical and mediastinal metastatic lymphadenopathy; nonspecific mildly prominent activity within the nasopharynx and left pharyngeal tonsil (potentially reactive in etiology); and resolution of the previously demonstrated left pelvic and inguinal adenopathy. PET otherwise showed no evidence of distant metastatic disease and no findings suspicious for recurrent lymphoma. 07/05/22 He saw Dr. Ernestene Kiel at [facility] to discuss the biopsy findings. During that visit the patient endorsed increasing hoarseness over the past couple of weeks and intermittent bilateral ear pain. Dr. Ernestene Kiel recommended discussing at ENT TB for optimal treatment course. He was subsequently referred to Dr. Dorris Fetch on 07/14/22 who recommended proceeding with a mediastinoscopy for additional nodal biopsies/excisions of the left paratracheal adenopathy to confirm his diagnosis. Biopsies of the left paratracheal mass collected on 07/22/22 showed fibrous tissue involved by squamous cell carcinoma; nodal status  of 3/3 lymph nodes negative for metastatic carcinoma. Dr. Dorris Fetch did not recommend resection given that it was not possible to remove the visible disease.  Dr. Candise Che subsequently recommended proceeding with systemic treatment consisting of carbo/5FU/Pembrolizumab based on PdL1 status, followed by palliative radiation therapy to any residual disease s/p systemic treatment. He received his first dose on 08/15/22. Chemo toxicities reported by the patient throughout the course of systemic treatment included mild fatigue, nausea (improved with steroids) occasional bilateral hand and leg neuropathy due to Pembrolizumab, and swallowing problems. He received his 4th cycle of chemotherapy on 11/08/22. 11/07/22 PET demonstrated progressive enlargement and hypermetabolic activity within the previously demonstrated right level II cervical and left paratracheal lymph nodes consistent with progressive  metastatic disease. PET otherwise showed no evidence of metastatic disease in the abdomen or pelvis, and no evidence of osseous metastatic disease.Treatment plan:  He will receive 30 fractions of radiation to his right neck and mediastinum.  Radiation started 12/01/22 and will complete 01/13/23  PAIN:  Are you having pain? No  FALLS: Has patient fallen in last 6 months?  No   PATIENT GOALS: Pt indicated he would like to consistently swallow normally  OBJECTIVE:    TODAY'S TREATMENT:                                                                                                                                         DATE:   01/25/23:Pt dx/d with COVID 01/17/23 with cough x2 days, so recently hasn't felt like doing exercises. Pt performed exercises today with min A for Masako (tongue protrusion). MBS was re-scheduled to 01/31/23. Pt ate peanut butter crackers and drank water without overt s/sx oral or pharyngeal difficulty, including aspiration. Taking smaller bites.  When pt performed Masako he coughed while performing exercise due to taking a DRINK of water (pt was instructed to take a DROP of water). SLP reiterated a DROP of water and not a sip. Pt then performed Masako correctly using this strategy successfully. SLP reiterated at least 20 reps/day with no less than 5 reps at once. Before SLP was done saying this pt stated "...and don't do less than 5. I was doing more than 5 - I got more than 20 every day." SLP congratulated pt on this. SLP told pt to use two-three sips of water prior to taking meds to aid in pharyngeal transit.   12/22/22 (eval): Research states the risk for dysphagia increases due to radiation and/or chemotherapy treatment due to a variety of factors, so SLP educated the pt about the possibility of reduced/limited ability for PO intake during rad tx. SLP also educated pt regarding possible changes to swallowing musculature after rad tx, and why adherence to dysphagia HEP provided  today and PO consumption was necessary to inhibit muscle fibrosis following rad tx and to mitigate muscle disuse atrophy. SLP informed pt why this would be detrimental to their  swallowing status and to their pulmonary health. Pt demonstrated understanding of these things to SLP. SLP encouraged pt to safely eat and drink as deep into their radiation/chemotherapy as possible to provide the best possible long-term swallowing outcome for pt.    SLP then developed an individualized HEP for pt involving oral and pharyngeal strengthening and ROM and pt was instructed how to perform these exercises, including SLP demonstration. After SLP demonstration, pt return demonstrated each exercise. SLP ensured pt performance was correct prior to educating pt on next exercise. Pt required occasional min-mod cues faded to modified independent to perform HEP. Pt was instructed to complete this program 6-7 days/week, at last 20 reps/day without any less than 5 reps at once, until 6 months after his last day of rad tx, and then x2 a week after that, indefinitely.    PATIENT EDUCATION: Education details: late effects head/neck radiation on swallow function, HEP procedure, and modification to HEP when difficulty experienced with swallowing during and after radiation course Person educated: Patient Education method: Explanation, Demonstration, Verbal cues, and Handouts Education comprehension: verbalized understanding, returned demonstration, verbal cues required, and needs further education   ASSESSMENT:  CLINICAL IMPRESSION: Patient is a 67 y.o. M who was seen today for treatment of swallowing following radiation therapy ending on 01/13/23. He has been eating food items from [facility] and regular diet. Today when pt ate items from regular diet and drank thin liquids, no s/sx of oral difficulties noted, At this time pt swallowing is deemed WNL/WFL with this diet and thin liquids. Due to coughing with Masako, MBS cont'd  warranted. No overt s/sx aspiration were observed. There are no overt s/s aspiration PNA observed by SLP nor any reported by pt at this time. Data indicate that pt's swallow ability will likely decrease over the course of radiation/chemoradiation therapy and could very well decline over time following the conclusion of that therapy due to muscle disuse atrophy and/or muscle fibrosis. Pt will cont to need to be seen by SLP in order to assess safety of PO intake, assess the need for recommending any objective swallow assessment, and ensuring pt is correctly completing the individualized HEP.  OBJECTIVE IMPAIRMENTS: include dysphagia. These impairments are limiting patient from safety when swallowing. Factors affecting potential to achieve goals and functional outcome are  none noted . Patient will benefit from skilled SLP services to address above impairments and improve overall function.  REHAB POTENTIAL: Good   GOALS: Goals reviewed with patient? No  SHORT TERM GOALS: Target: 3rd total session        1.   Pt will complete HEP with modified independence in 2 sessions Baseline: Goal status: Initial   2.  pt will tell SLP why pt is completing HEP with modified independence Baseline:  Goal status: Initial   3.  pt will describe 3 overt s/s aspiration PNA with modified independence Baseline:  Goal status: Initial   4.  pt will tell SLP how a food journal could hasten return to a more normalized diet Baseline:  Goal status: Initial     LONG TERM GOALS: Target: 7th total session   1.  pt will complete HEP with independence over two visits Baseline:  Goal status: Initial   2.  pt will describe how to modify HEP over time, and the timeline associated with reduction in HEP frequency with modified independence over two sessions Baseline:  Goal status: Initial  3.  Pt will follow precautions from possible MBS in 2 sessions Baseline:  Goal status: Initial PLAN:  SLP FREQUENCY:  once  every 4-6 weeks  SLP DURATION:  7 sessions  PLANNED INTERVENTIONS: Aspiration precaution training, Pharyngeal strengthening exercises, Diet toleration management , Trials of upgraded texture/liquids, SLP instruction and feedback, Compensatory strategies, and Patient/family education    [facility], CCC-SLP 01/25/2023, 1:15 PM

## 2023-01-26 ENCOUNTER — Other Ambulatory Visit: Payer: Self-pay

## 2023-01-27 ENCOUNTER — Other Ambulatory Visit: Payer: Self-pay

## 2023-01-27 ENCOUNTER — Encounter: Payer: Self-pay | Admitting: Radiation Oncology

## 2023-01-27 ENCOUNTER — Inpatient Hospital Stay: Payer: Medicare HMO | Admitting: Dietician

## 2023-01-27 ENCOUNTER — Ambulatory Visit
Admission: RE | Admit: 2023-01-27 | Discharge: 2023-01-27 | Disposition: A | Discharge status: Home / Self Care | Payer: Medicare HMO | Source: Ambulatory Visit | Attending: Radiation Oncology | Admitting: Radiation Oncology

## 2023-01-27 VITALS — BP 114/68 | HR 108 | Temp 97.5°F | Resp 18 | Ht 72.0 in | Wt 262.1 lb

## 2023-01-27 DIAGNOSIS — J449 Chronic obstructive pulmonary disease, unspecified: Secondary | ICD-10-CM | POA: Diagnosis not present

## 2023-01-27 DIAGNOSIS — Z803 Family history of malignant neoplasm of breast: Secondary | ICD-10-CM | POA: Diagnosis not present

## 2023-01-27 DIAGNOSIS — Z7982 Long term (current) use of aspirin: Secondary | ICD-10-CM | POA: Diagnosis not present

## 2023-01-27 DIAGNOSIS — Z87891 Personal history of nicotine dependence: Secondary | ICD-10-CM | POA: Insufficient documentation

## 2023-01-27 DIAGNOSIS — C320 Malignant neoplasm of glottis: Secondary | ICD-10-CM | POA: Insufficient documentation

## 2023-01-27 DIAGNOSIS — C771 Secondary and unspecified malignant neoplasm of intrathoracic lymph nodes: Secondary | ICD-10-CM | POA: Diagnosis not present

## 2023-01-27 DIAGNOSIS — C4492 Squamous cell carcinoma of skin, unspecified: Secondary | ICD-10-CM

## 2023-01-27 DIAGNOSIS — E785 Hyperlipidemia, unspecified: Secondary | ICD-10-CM | POA: Diagnosis not present

## 2023-01-27 DIAGNOSIS — I739 Peripheral vascular disease, unspecified: Secondary | ICD-10-CM | POA: Diagnosis not present

## 2023-01-27 DIAGNOSIS — I10 Essential (primary) hypertension: Secondary | ICD-10-CM | POA: Diagnosis not present

## 2023-01-27 DIAGNOSIS — Z7984 Long term (current) use of oral hypoglycemic drugs: Secondary | ICD-10-CM | POA: Insufficient documentation

## 2023-01-27 DIAGNOSIS — Z800 Family history of malignant neoplasm of digestive organs: Secondary | ICD-10-CM | POA: Insufficient documentation

## 2023-01-27 DIAGNOSIS — Z923 Personal history of irradiation: Secondary | ICD-10-CM | POA: Diagnosis not present

## 2023-01-27 DIAGNOSIS — Z79899 Other long term (current) drug therapy: Secondary | ICD-10-CM | POA: Diagnosis not present

## 2023-01-27 DIAGNOSIS — Z9221 Personal history of antineoplastic chemotherapy: Secondary | ICD-10-CM | POA: Insufficient documentation

## 2023-01-27 DIAGNOSIS — C8338 Diffuse large B-cell lymphoma, lymph nodes of multiple sites: Secondary | ICD-10-CM | POA: Insufficient documentation

## 2023-01-27 DIAGNOSIS — C770 Secondary and unspecified malignant neoplasm of lymph nodes of head, face and neck: Secondary | ICD-10-CM | POA: Diagnosis not present

## 2023-01-27 NOTE — Progress Notes (Signed)
Nutrition Follow-up:  Pt with history of laryngeal cancer as well as lymphoma. S/p radiation therapy for both. Now with SCC of right neck and mediastinum, progressive on systemic therapy. He is receiving radiotherapy concurrent with [place]. Pt completed final RT 9/6.   Met with pt following MD appointment. He reports doing well. His taste has significantly improved. Pt says some foods begin to taste gross after a few bites. Pt reports recent Covid infection on 9/10. He has recovered well from this. Patient has a good appetite and tolerating a regular diet. Reports eating some of his favorite foods (cucumbers, nuts) without fear of choking. Patient had boiled eggs with sliced cucumber for breakfast. Recalls meatloaf, mashed potatoes/gravy, red cabbage for dinner last night.     Medications: reviewed   Labs: no new labs for review  Anthropometrics: Wt 262 lb 2 oz today Jarold Song)  9/3 - 268.2 lb 8/26 - 272 lb  8/19 - 276.2 lb 8/12 - 273 lb 12 oz   NUTRITION DIAGNOSIS: Inadequate oral intake improving    INTERVENTION:  Educated on adequate calorie/protein energy intake to support healing process Pt agreeable to drinking one Ensure Plus/equivalent     MONITORING, EVALUATION, GOAL: wt trends, intake   NEXT VISIT: No follow-up scheduled at this time. Pt has contact information and encouraged to contact with nutrition questions/concerns

## 2023-01-27 NOTE — Progress Notes (Signed)
Radiation Oncology         [phone] ________________________________  Outpatient  Followup  Name: Jesus [name] MRN: [id number]  Date: 01/27/2023  DOB: 1955/10/03  [id number], Tawni Pummel, MD  Johney Maine, MD   REFERRING PHYSICIAN: Johney Maine, MD  DIAGNOSIS:    ICD-10-CM   1. 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    Cancer Staging  Diffuse large B-cell lymphoma of lymph nodes of multiple regions [facility]) Staging form: Hodgkin and Non-Hodgkin Lymphoma, AJCC 8th Edition - Clinical: Stage IV - Signed by Lonie Peak, MD on 07/13/2018  Malignant neoplasm of glottis [facility]) Staging form: Larynx - Glottis, AJCC 8th Edition - Clinical stage from 10/02/2020: Stage I (cT1a, cN0, cM0) - Signed by Lonie Peak, MD on 10/02/2020  PREVIOUS RADIATION THERAPY:   3) First Treatment Date: 2022-11-29 - Last Treatment Date: 2023-01-13  Plan Name: HN_NckChst (right neck and upper thoracic adenopathy treated) Site: Neck Technique: IMRT Mode: Photon Dose Per Fraction: 2 Gy Prescribed Dose (Delivered / Prescribed): 60 Gy / 60 Gy Prescribed Fxs (Delivered / Prescribed): 30 / 30    2) Diagnoses: C32.0-Malignant neoplasm of glottis  Intent: Curative  Radiation Treatment Dates: 10/15/2020 through 11/25/2020 Site Technique Total Dose (Gy) Dose per Fx (Gy) Completed Fx Beam Energies  Larynx: HN_larynx 3D 63/63 2.25 28/28 6X   1) Diagnoses: C83.38-Diffuse large b-cell lymphoma, lymph nodes of multiple sites Intent: Curative Radiation Treatment Dates: 07/25/2018 through 08/23/2018 Site Technique Total Dose Dose per Fx Completed Fx Beam Energies  Pelvis: Pelvis IMRT 39.6/39.6 1.8 22/22 6X     NARRATIVE: Jesus [name]  presents to clinic today for follow up after completing radiation therapy for neck cancer and upper chest (lymph nodes). He completed treatment on 01-13-23.    Pain issues, if any: denies throat pain but still feels odd per report, voice is also still hoarse at time Using a feeding tube?: none Weight changes, if any:     Wt Readings from Last 3 Encounters:  01/27/23 262 lb 2 oz (118.9 kg)  01/10/23 296 lb 9.6 oz (134.5 kg)  01/08/23 270 lb (122.5 kg)  Actively trying to eat better and increase solid food intake. Working with nutrition on increasing intake.    Swallowing issues, if any: swallowing pills is difficult Smoking or chewing tobacco? Does not use Using fluoride toothpaste daily? yes Last ENT visit was on: none since treatment ended  Other notable issues, if any: Pt wants to know his progress and how he is doing overall. He reports his sense of taste is improving. He is pleased with how he feels. Did recover from covid earlier this month.      Vitals:    01/27/23 1114  BP: 114/68  Pulse: (!) 108  Resp: 18  Temp: (!) 97.5 F (36.4 C)  SpO2: 100%      PAST MEDICAL HISTORY:  has a past medical history of Allergy, Anemia, Arthritis, Blood transfusion without reported diagnosis, Cancer (HCC), Cataract, COPD (chronic obstructive pulmonary disease) (HCC), Family history of colon cancer, History of chemotherapy, History of kidney stones, History of radiation therapy, Hyperlipidemia, Hypertension, Irregular heart beats, Lymphadenopathy, Pain, lower leg, Peripheral arterial disease (HCC), Pre-diabetes, Red-green color blindness, RLS (restless legs syndrome),  Snores, and Wears glasses.    PAST SURGICAL HISTORY: Past Surgical History:  Procedure Laterality Date   CATARACT EXTRACTION W/ INTRAOCULAR LENS  IMPLANT, BILATERAL     COLONOSCOPY     DIRECT LARYNGOSCOPY Right 02/03/2022   Procedure: DIRECT LARYNGOSCOPY WITH BIOPSY OF RIGHT FALSE VOCAL CORD;  Surgeon: Osborn Coho, MD;   Location: [facility] OR;  Service: ENT;  Laterality: Right;   dislodged salava stone     FRACTURE SURGERY     HAND ARTHROPLASTY  1995   crushed left hand   INGUINAL LYMPH NODE BIOPSY Left 01/02/2018   Procedure: LEFT INGUINAL LYMPH NODE BIOPSY;  Surgeon: Emelia Loron, MD;  Location: [facility] OR;  Service: General;  Laterality: Left;   IR IMAGING GUIDED PORT INSERTION  01/15/2018   IR IMAGING GUIDED PORT INSERTION  08/10/2022   IR REMOVAL TUN ACCESS W/ PORT W/O FL MOD SED  03/11/2019   MEDIASTINOSCOPY N/A 07/22/2022   Procedure: MEDIASTINOSCOPY;  Surgeon: Loreli Slot, MD;  Location: [facility] OR;  Service: Thoracic;  Laterality: N/A;   MICROLARYNGOSCOPY Left 01/17/2014   Procedure: MICROLARYNGOSCOPY WITH EXCISION OF THE BIOPSY OF LEFT VOCAL CORD LESION;  Surgeon: Serena Colonel, MD;  Location: [facility];  Service: ENT;  Laterality: Left;   MICROLARYNGOSCOPY N/A 09/16/2020   Procedure: MICROLARYNGOSCOPY with Biopsy of vocal cord lesion;  Surgeon: Osborn Coho, MD;  Location: [facility] OR;  Service: ENT;  Laterality: N/A;   ORIF FOOT FRACTURE  2005   left   REFRACTIVE SURGERY Right    removed cloudiness in right eye after cataract removal     FAMILY HISTORY: family history includes Breast cancer in his mother and sister; Colon cancer (age of onset: 49) in his brother; Diabetes in his father; Hypertension in his brother, daughter, and father; Mental illness in his daughter and sister; Stroke in his father.  SOCIAL HISTORY:  reports that he quit smoking about 2 years ago. His smoking use included cigarettes. He started smoking about 38 years ago. He has a 18 pack-year smoking history. He has never used smokeless tobacco. He reports current alcohol use of about 6.0 standard drinks of alcohol per week. He reports current drug use. Drugs: , Marijuana, and Cocaine.  ALLERGIES: Bee venom, Lisinopril, Antifungal [miconazole nitrate], and Zolpidem tartrate er  MEDICATIONS:  Current Outpatient  Medications  Medication Sig Dispense Refill   ACCU-CHEK GUIDE test strip 1 each by Other route as needed for other.     Accu-Chek Softclix Lancets lancets      acetaminophen (TYLENOL) 500 MG tablet Take 1,500 mg by mouth daily as needed for moderate pain.     aspirin EC 81 MG tablet Take 81 mg by mouth every evening. Swallow whole.     b complex vitamins capsule Take 1 capsule by mouth daily.     lidocaine (XYLOCAINE) 2 % solution Patient: Mix 1part 2% viscous lidocaine, 1part H20. Swallow 10mL of diluted mixture, before meals and at bedtime, up to QID to help sore throat 200 mL 3   lidocaine-prilocaine (EMLA) cream Apply to affected area once 30 g 3   lisinopril (ZESTRIL) 20 MG tablet TAKE 1 TABLET BY MOUTH EVERY DAY (Patient taking differently: Take 20 mg by mouth every evening.) 30 tablet 0   metFORMIN (GLUCOPHAGE) 500 MG tablet Take 500 mg by mouth 2 (two) times daily with a meal.     Multiple Vitamins-Minerals (MULTI ADULT GUMMIES PO) Take 1 tablet by mouth in the morning. Centrum  oxyCODONE (OXY IR/ROXICODONE) 5 MG immediate release tablet Take 1 tablet (5 mg total) by mouth every 6 (six) hours as needed for moderate pain or breakthrough pain. 20 tablet 0   prochlorperazine (COMPAZINE) 10 MG tablet Take 1 tablet (10 mg total) by mouth every 6 (six) hours as needed for nausea or vomiting. 30 tablet 1   tadalafil (CIALIS) 20 MG tablet Take 20 mg by mouth daily as needed for erectile dysfunction.     amLODipine (NORVASC) 10 MG tablet TAKE 1 TABLET BY MOUTH EVERY DAY (Patient not taking: Reported on 01/11/2023) 30 tablet 0   HYDROcodone-acetaminophen (HYCET) 7.5-325 mg/15 ml solution Take 10-15 mLs by mouth 4 (four) times daily as needed for moderate pain. Take with food. 300 mL 0   magic mouthwash w/lidocaine SOLN Take 5 mLs by mouth 4 (four) times daily as needed for mouth pain. Compound 400 mL with the following 80 ML of distilled water 80 mL of Maalox 80 mL of nystatin 500,000 units  per 5 mL 80 mL of 2% viscous lidocaine 80 mL of Benadryl 12.5mg  per 5 mL 400 mL 0   ondansetron (ZOFRAN) 8 MG tablet Take 1 tablet (8 mg total) by mouth every 8 (eight) hours as needed for nausea or vomiting. Start on the third day after carboplatin. 30 tablet 1   sucralfate (CARAFATE) 1 g tablet Take 1 tablet (1 g total) by mouth 2 (two) times daily. 60 tablet 0   No current facility-administered medications for this encounter.    REVIEW OF SYSTEMS:  Notable for that above.   PHYSICAL EXAM:  height is 6' (1.829 m) and weight is 262 lb 2 oz (118.9 kg). His temporal temperature is 97.5 F (36.4 C) (abnormal). His blood pressure is 114/68 and his pulse is 108 (abnormal). His respiration is 18 and oxygen saturation is 100%.   General: Alert and oriented, in no acute distress HEENT: Head is normocephalic. Extraocular movements are intact. No oral thrush Neck:  right cervical mass is almost completely flattened Skin: No concerning lesions. Hyperpigmentation resolving over neck, upper chest Musculoskeletal: symmetric strength and muscle tone throughout. Neurologic: Cranial nerves II through XII are grossly intact. No obvious focalities. Speech is fluent. Coordination is intact. Psychiatric: Judgment and insight are intact. Affect is appropriate.   ECOG = 1  0 - Asymptomatic (Fully active, able to carry on all predisease activities without restriction)  1 - Symptomatic but completely ambulatory (Restricted in physically strenuous activity but ambulatory and able to carry out work of a light or sedentary nature. For example, light housework, office work)  2 - Symptomatic, <50% in bed during the day (Ambulatory and capable of all self care but unable to carry out any work activities. Up and about more than 50% of waking hours)  3 - Symptomatic, >50% in bed, but not bedbound (Capable of only limited self-care, confined to bed or chair 50% or more of waking hours)  4 - Bedbound (Completely  disabled. Cannot carry on any self-care. Totally confined to bed or chair)  5 - Death   Santiago Glad MM, Creech RH, Tormey DC, et al. [phone]). "Toxicity and response criteria of the [facility]". Am. Evlyn Clines. Oncol. 5 (6): 649-55   LABORATORY DATA:  Lab Results  Component Value Date   WBC 6.8 01/10/2023   HGB 8.2 (L) 01/10/2023   HCT 24.7 (L) 01/10/2023   MCV 89.5 01/10/2023   PLT 484 (H) 01/10/2023   CMP     Component  Value Date/Time   NA 133 (L) 01/10/2023 1216   NA 143 08/21/2019 1459   K 5.0 01/10/2023 1216   CL 99 01/10/2023 1216   CO2 25 01/10/2023 1216   GLUCOSE 177 (H) 01/10/2023 1216   BUN 56 (H) 01/10/2023 1216   BUN 17 08/21/2019 1459   CREATININE 1.95 (H) 01/10/2023 1216   CREATININE 1.09 04/29/2014 1930   CALCIUM 10.0 01/10/2023 1216   PROT 8.1 01/10/2023 1216   PROT 7.0 08/21/2019 1459   ALBUMIN 4.2 01/10/2023 1216   ALBUMIN 4.7 08/21/2019 1459   AST 13 (L) 01/10/2023 1216   ALT 12 01/10/2023 1216   ALKPHOS 74 01/10/2023 1216   BILITOT 0.4 01/10/2023 1216   GFRNONAA 37 (L) 01/10/2023 1216   GFRNONAA 74 04/29/2014 1930   GFRAA >60 11/28/2019 1329   GFRAA 86 04/29/2014 1930      Lab Results  Component Value Date   TSH 1.907 01/10/2023     RADIOGRAPHY: No results found.    IMPRESSION/PLAN:  This is a delightful patient with laryngeal head and neck cancer and lymphoma, s/p RT for both.  Recently completed 6 week of salvage RT for metastatic squamous cell carcinoma to right neck and mediastinum   Doing well and recovering nicely. Eating and swallowing were impaired during RT but normalizing well now. No pain.  F/u in 2.5 mo w/ restaging PET.    He is UTD on TSH labs. Lab Results  Component Value Date   TSH 1.907 01/10/2023     On date of service, in total, I spent 25 minutes on this encounter. Patient was seen in person.  __________________________________________   Lonie Peak, MD

## 2023-01-27 NOTE — Progress Notes (Signed)
Oncology Nurse Navigator Documentation   I met with [name] during his follow up with Dr. Basilio Cairo today. He is feeling well. He will see Dr. Candise Che on Monday for a scheduled appointment. Dr. Basilio Cairo will see him again in December to discuss the results of a PET that will be completed before then. [name] knows to call me if he has any needs at anytime.  Hedda Slade RN, BSN, OCN Head & Neck Oncology Nurse Navigator [facility] Phone # [phone]  Fax # [phone]

## 2023-01-28 ENCOUNTER — Other Ambulatory Visit: Payer: Self-pay

## 2023-01-30 ENCOUNTER — Inpatient Hospital Stay: Payer: Medicare HMO | Admitting: Hematology

## 2023-01-30 ENCOUNTER — Inpatient Hospital Stay: Payer: Medicare HMO

## 2023-01-30 VITALS — BP 121/76 | HR 88 | Temp 98.0°F | Resp 16

## 2023-01-30 VITALS — BP 126/82 | HR 111 | Temp 98.2°F | Resp 19 | Ht 72.0 in | Wt 263.6 lb

## 2023-01-30 DIAGNOSIS — C760 Malignant neoplasm of head, face and neck: Secondary | ICD-10-CM

## 2023-01-30 DIAGNOSIS — Z5111 Encounter for antineoplastic chemotherapy: Secondary | ICD-10-CM

## 2023-01-30 DIAGNOSIS — Z7189 Other specified counseling: Secondary | ICD-10-CM

## 2023-01-30 DIAGNOSIS — Z79620 Long term (current) use of immunosuppressive biologic: Secondary | ICD-10-CM | POA: Diagnosis not present

## 2023-01-30 DIAGNOSIS — Z510 Encounter for antineoplastic radiation therapy: Secondary | ICD-10-CM | POA: Diagnosis not present

## 2023-01-30 DIAGNOSIS — Z95828 Presence of other vascular implants and grafts: Secondary | ICD-10-CM

## 2023-01-30 DIAGNOSIS — C770 Secondary and unspecified malignant neoplasm of lymph nodes of head, face and neck: Secondary | ICD-10-CM | POA: Diagnosis not present

## 2023-01-30 DIAGNOSIS — C801 Malignant (primary) neoplasm, unspecified: Secondary | ICD-10-CM | POA: Diagnosis not present

## 2023-01-30 LAB — CBC WITH DIFFERENTIAL (CANCER CENTER ONLY)
Abs Immature Granulocytes: 0.03 10*3/uL (ref 0.00–0.07)
Basophils Absolute: 0 10*3/uL (ref 0.0–0.1)
Basophils Relative: 1 %
Eosinophils Absolute: 0.1 10*3/uL (ref 0.0–0.5)
Eosinophils Relative: 3 %
HCT: 28 % — ABNORMAL LOW (ref 39.0–52.0)
Hemoglobin: 8.9 g/dL — ABNORMAL LOW (ref 13.0–17.0)
Immature Granulocytes: 1 %
Lymphocytes Relative: 26 %
Lymphs Abs: 1 10*3/uL (ref 0.7–4.0)
MCH: 29.2 pg (ref 26.0–34.0)
MCHC: 31.8 g/dL (ref 30.0–36.0)
MCV: 91.8 fL (ref 80.0–100.0)
Monocytes Absolute: 0.5 10*3/uL (ref 0.1–1.0)
Monocytes Relative: 13 %
Neutro Abs: 2.3 10*3/uL (ref 1.7–7.7)
Neutrophils Relative %: 56 %
Platelet Count: 404 10*3/uL — ABNORMAL HIGH (ref 150–400)
RBC: 3.05 MIL/uL — ABNORMAL LOW (ref 4.22–5.81)
RDW: 16.5 % — ABNORMAL HIGH (ref 11.5–15.5)
WBC Count: 4 10*3/uL (ref 4.0–10.5)
nRBC: 0 % (ref 0.0–0.2)

## 2023-01-30 LAB — CMP (CANCER CENTER ONLY)
ALT: 12 U/L (ref 0–44)
AST: 14 U/L — ABNORMAL LOW (ref 15–41)
Albumin: 4.2 g/dL (ref 3.5–5.0)
Alkaline Phosphatase: 74 U/L (ref 38–126)
Anion gap: 6 (ref 5–15)
BUN: 26 mg/dL — ABNORMAL HIGH (ref 8–23)
CO2: 29 mmol/L (ref 22–32)
Calcium: 10 mg/dL (ref 8.9–10.3)
Chloride: 103 mmol/L (ref 98–111)
Creatinine: 1.27 mg/dL — ABNORMAL HIGH (ref 0.61–1.24)
GFR, Estimated: >60 mL/min (ref >60)
Glucose, Bld: 136 mg/dL — ABNORMAL HIGH (ref 70–99)
Potassium: 4.4 mmol/L (ref 3.5–5.1)
Sodium: 138 mmol/L (ref 135–145)
Total Bilirubin: 0.4 mg/dL (ref 0.3–1.2)
Total Protein: 7.8 g/dL (ref 6.5–8.1)

## 2023-01-30 LAB — TSH: TSH: 1.594 u[IU]/mL (ref 0.350–4.500)

## 2023-01-30 MED ORDER — SODIUM CHLORIDE 0.9 % IV SOLN
200.0000 mg | Freq: Once | INTRAVENOUS | Status: AC
  Administered 2023-01-30: 200 mg via INTRAVENOUS
  Filled 2023-01-30: qty 200

## 2023-01-30 MED ORDER — HEPARIN SOD (PORK) LOCK FLUSH 100 UNIT/ML IV SOLN
500.0000 [IU] | Freq: Once | INTRAVENOUS | Status: AC | PRN
  Administered 2023-01-30: 500 [IU]

## 2023-01-30 MED ORDER — SODIUM CHLORIDE 0.9% FLUSH
10.0000 mL | Freq: Once | INTRAVENOUS | Status: AC
  Administered 2023-01-30: 10 mL

## 2023-01-30 MED ORDER — SODIUM CHLORIDE 0.9% FLUSH
10.0000 mL | INTRAVENOUS | Status: DC | PRN
  Administered 2023-01-30: 10 mL

## 2023-01-30 MED ORDER — SODIUM CHLORIDE 0.9 % IV SOLN: Freq: Once | INTRAVENOUS | Status: AC

## 2023-01-30 NOTE — Patient Instructions (Signed)
[facility]  Discharge Instructions: Thank you for choosing [facility] to provide your oncology and hematology care.   If you have a lab appointment with the [facility], please go directly to the [facility] and check in at the registration area.   Wear comfortable clothing and clothing appropriate for easy access to any Portacath or PICC line.   We strive to give you quality time with your provider. You may need to reschedule your appointment if you arrive late (15 or more minutes).  Arriving late affects you and other patients whose appointments are after yours.  Also, if you miss three or more appointments without notifying the office, you may be dismissed from the clinic at the provider's discretion.      For prescription refill requests, have your pharmacy contact our office and allow 72 hours for refills to be completed.    Today you received the following chemotherapy and/or immunotherapy agents: Keytruda      To help prevent nausea and vomiting after your treatment, we encourage you to take your nausea medication as directed.  BELOW ARE SYMPTOMS THAT SHOULD BE REPORTED IMMEDIATELY: *FEVER GREATER THAN 100.4 F (38 C) OR HIGHER *CHILLS OR SWEATING *NAUSEA AND VOMITING THAT IS NOT CONTROLLED WITH YOUR NAUSEA MEDICATION *UNUSUAL SHORTNESS OF BREATH *UNUSUAL BRUISING OR BLEEDING *URINARY PROBLEMS (pain or burning when urinating, or frequent urination) *BOWEL PROBLEMS (unusual diarrhea, constipation, pain near the anus) TENDERNESS IN MOUTH AND THROAT WITH OR WITHOUT PRESENCE OF ULCERS (sore throat, sores in mouth, or a toothache) UNUSUAL RASH, SWELLING OR PAIN  UNUSUAL VAGINAL DISCHARGE OR ITCHING   Items with * indicate a potential emergency and should be followed up as soon as possible or go to the Emergency Department if any problems should occur.  Please show the CHEMOTHERAPY ALERT CARD or IMMUNOTHERAPY ALERT CARD at  check-in to the Emergency Department and triage nurse.  Should you have questions after your visit or need to cancel or reschedule your appointment, please contact [facility]  Dept: [phone]  and follow the prompts.  Office hours are 8:00 a.m. to 4:30 p.m. Monday - Friday. Please note that voicemails left after 4:00 p.m. may not be returned until the following business day.  We are closed weekends and major holidays. You have access to a nurse at all times for urgent questions. Please call the main number to the clinic Dept: [phone] and follow the prompts.   For any non-urgent questions, you may also contact your provider using MyChart. We now offer e-Visits for anyone 14 and older to request care online for non-urgent symptoms. For details visit [email or web address].   Also download the MyChart app! Go to the app store, search "MyChart", open the app, select [facility], and log in with your MyChart username and password.

## 2023-01-30 NOTE — Progress Notes (Signed)
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  HISTORY OF PRESENTING ILLNESS:   Jesus [name] 67 y.o. male is here because of left lower extremity edema and lymphadenopathy.  The patient was seen in the emergency room this past Friday for the same issue.  A CT of the abdomen and pelvis was performed showing bulky left inguinal, left hemipelvic, and retroperitoneal adenopathy.  He was referred to [place] from the emergency room for further evaluation.  Doppler ultrasound of the left lower extremity was performed and was negative for DVT. Patient reports that he has been having left lower extremity edema in his left groin and left leg for approximately 1 month.  He states that the swelling in the left groin started to get better but then worsened.  The left lower extremity edema has slowly worsened over time.  Patient denies having fevers and chills.  He reports that he does have night sweats at times.  He reported having headaches approximate 1 [place]  He thinks his headaches are related to not having his blood pressure medication.  His headaches have now resolved.  He denies visual changes.  The patient denies chest pain, shortness of breath and cough.  No nausea, vomiting, constipation, diarrhea.  Denies abdominal pain.  The patient denies recent weight loss and has actually  gained weight recently.  Patient denies epistaxis, bleeding gums, hemoptysis, hematuria, but occasionally, and melena.  He reports increased urinary frequency over the past month but no dysuria.  The patient is here for evaluation and discussion of his recent CT and lab findings.  Interval History:  Jesus [name] 67 y.o. male who is here for continued evaluation and management of newly noted metastatic laryngeal squamous cell carcinoma. He is here to start cycle 9 day 1 of Pembrolizumab.  Patient was last seen by me on 01/10/2023 and he complained of weight loss due to appetite loss, taste aversion, and one episode of gum bleeding.   Patient notes he has been doing well overall without any new or severe medical concerns since our last visit. He notes his last radiation treatment was around 2-3 weeks ago. His taste has improved after his radiation treatment. Patient notes his appetite has improved, which has increased his energy levels. He has been taking ensure supplement as well and has been staying well hydrated.  He denies new infection issues, fever, chills, night sweats, abnormal bowel movement, bone pain, abdominal pain, chest pain, back pain, or leg swelling. He denies weight loss since our last visit.   He had COVID-19 infection around 1 month ago and has recovered well.   Patient tolerated cycle 8 of his Pembrolizumab treatment well without any new or severe toxicities.   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- last radiation 06-2018   Cataract    removed both eyes with l;ens implants    COPD (chronic obstructive pulmonary disease) (HCC)    Family history of colon cancer    in his brother- dx'd age 70    History of chemotherapy    last 06-01-2018   History of kidney  stones    History of radiation therapy    last radiation 06-2018   Hyperlipidemia    currently under control   Hypertension    Irregular heart beats    Lymphadenopathy    Pain, lower leg    Bilateral   Peripheral arterial disease (HCC)    Pre-diabetes    Red-green color blindness    RLS (restless legs syndrome)    Snores    Wears glasses     SURGICAL HISTORY: Past Surgical History:  Procedure Laterality Date   CATARACT EXTRACTION W/ INTRAOCULAR LENS  IMPLANT, BILATERAL     COLONOSCOPY     DIRECT LARYNGOSCOPY Right 02/03/2022   Procedure: DIRECT LARYNGOSCOPY WITH BIOPSY OF RIGHT FALSE VOCAL CORD;  Surgeon: Osborn Coho, MD;  Location: [facility] OR;  Service: ENT;  Laterality: Right;   dislodged salava stone     FRACTURE SURGERY     HAND ARTHROPLASTY  1995   crushed left hand   INGUINAL LYMPH NODE BIOPSY Left 01/02/2018   Procedure: LEFT INGUINAL LYMPH NODE BIOPSY;  Surgeon: Emelia Loron, MD;  Location: [facility] OR;  Service: General;  Laterality: Left;   IR IMAGING GUIDED PORT INSERTION  01/15/2018   IR IMAGING GUIDED PORT INSERTION  08/10/2022   IR REMOVAL TUN ACCESS W/ PORT W/O FL MOD SED  03/11/2019   MEDIASTINOSCOPY N/A 07/22/2022   Procedure: MEDIASTINOSCOPY;  Surgeon: Loreli Slot, MD;  Location: [facility] OR;  Service: Thoracic;  Laterality: N/A;   MICROLARYNGOSCOPY Left 01/17/2014   Procedure: MICROLARYNGOSCOPY WITH EXCISION OF THE BIOPSY OF LEFT VOCAL CORD LESION;  Surgeon: Serena Colonel, MD;  Location: [facility];  Service: ENT;  Laterality: Left;   MICROLARYNGOSCOPY N/A 09/16/2020   Procedure: MICROLARYNGOSCOPY with Biopsy of vocal cord lesion;  Surgeon: Osborn Coho, MD;  Location: [facility] OR;  Service: ENT;  Laterality: N/A;   ORIF FOOT FRACTURE  2005   left   REFRACTIVE SURGERY Right    removed cloudiness in right eye after cataract removal     SOCIAL HISTORY: Social History   Socioeconomic History   Marital status: Divorced    Spouse name: Not on file    Number of children: 3   Years of education: Not on file   Highest education level: Not on file  Occupational History   Not on file  Tobacco Use   Smoking status: Former    Current packs/day: 0.00    Average packs/day: 0.5 packs/day for 36.0 years (18.0 ttl pk-yrs)    Types: Cigarettes    Start date: 09/28/1984    Quit date: 09/28/2020    Years since quitting: 2.3   Smokeless tobacco: Never   Tobacco comments:    he denies smoking in about 2 weeks 07/13/18  Vaping Use   Vaping status: Never Used  Substance and Sexual Activity   Alcohol use: Yes    Alcohol/week: 6.0 standard drinks of alcohol    Types: 6 Cans of beer per week    Comment: Last use: Friday 30th   Drug use: Yes  Types: Marijuana, Cocaine    Comment: Last [facility] Use: Friday 30th.   Sexual activity: Not Currently  Other Topics Concern   Not on file  Social History Narrative   Not on file   Social Determinants of Health   Financial Resource Strain: Low Risk  (10/06/2020)   Overall Financial Resource Strain (CARDIA)    Difficulty of Paying Living Expenses: Not very hard  Food Insecurity: No Food Insecurity (11/09/2022)   Hunger Vital Sign    Worried About Running Out of Food in the Last Year: Never true    Ran Out of Food in the Last Year: Never true  Transportation Needs: No Transportation Needs (11/09/2022)   PRAPARE - Administrator, Civil Service (Medical): No    Lack of Transportation (Non-Medical): No  Physical Activity: Not on file  Stress: Not on file  Social Connections: Moderately Integrated (10/06/2020)   Social Connection and Isolation Panel [NHANES]    Frequency of Communication with Friends and Family: Three times a week    Frequency of Social Gatherings with Friends and Family: Three times a week    Attends Religious Services: 1 to 4 times per year    Active Member of Clubs or Organizations: Yes    Attends Banker Meetings: 1 to 4 times per year    Marital Status: Divorced   Intimate Partner Violence: Not At Risk (11/09/2022)   Humiliation, Afraid, Rape, and Kick questionnaire    Fear of Current or Ex-Partner: No    Emotionally Abused: No    Physically Abused: No    Sexually Abused: No    FAMILY HISTORY: Family History  Problem Relation Age of Onset   Breast cancer Mother    Diabetes Father    Hypertension Father    Stroke Father    Mental illness Sister    Hypertension Daughter    Mental illness Daughter    Hypertension Brother    Colon cancer Brother 72       passed away 12/13/2018   Breast cancer Sister    Esophageal cancer Neg Hx    Colon polyps Neg Hx    Rectal cancer Neg Hx    Stomach cancer Neg Hx     ALLERGIES:  is allergic to bee venom, lisinopril, antifungal [miconazole nitrate], and zolpidem tartrate er.  MEDICATIONS:  Current Outpatient Medications  Medication Sig Dispense Refill   ACCU-CHEK GUIDE test strip 1 each by Other route as needed for other.     Accu-Chek Softclix Lancets lancets      acetaminophen (TYLENOL) 500 MG tablet Take 1,500 mg by mouth daily as needed for moderate pain.     amLODipine (NORVASC) 10 MG tablet TAKE 1 TABLET BY MOUTH EVERY DAY (Patient not taking: Reported on 01/11/2023) 30 tablet 0   aspirin EC 81 MG tablet Take 81 mg by mouth every evening. Swallow whole.     b complex vitamins capsule Take 1 capsule by mouth daily.     HYDROcodone-acetaminophen (HYCET) 7.5-325 mg/15 ml solution Take 10-15 mLs by mouth 4 (four) times daily as needed for moderate pain. Take with food. 300 mL 0   lidocaine (XYLOCAINE) 2 % solution Patient: Mix 1part 2% viscous lidocaine, 1part H20. Swallow 10mL of diluted mixture, before meals and at bedtime, up to QID to help sore throat 200 mL 3   lidocaine-prilocaine (EMLA) cream Apply to affected area once 30 g 3   lisinopril (ZESTRIL) 20 MG tablet TAKE 1 TABLET BY  MOUTH EVERY DAY (Patient taking differently: Take 20 mg by mouth every evening.) 30 tablet 0   magic mouthwash  w/lidocaine SOLN Take 5 mLs by mouth 4 (four) times daily as needed for mouth pain. Compound 400 mL with the following 80 ML of distilled water 80 mL of Maalox 80 mL of nystatin 500,000 units per 5 mL 80 mL of 2% viscous lidocaine 80 mL of Benadryl 12.5mg  per 5 mL 400 mL 0   metFORMIN (GLUCOPHAGE) 500 MG tablet Take 500 mg by mouth 2 (two) times daily with a meal.     Multiple Vitamins-Minerals (MULTI ADULT GUMMIES PO) Take 1 tablet by mouth in the morning. Centrum     ondansetron (ZOFRAN) 8 MG tablet Take 1 tablet (8 mg total) by mouth every 8 (eight) hours as needed for nausea or vomiting. Start on the third day after carboplatin. 30 tablet 1   oxyCODONE (OXY IR/ROXICODONE) 5 MG immediate release tablet Take 1 tablet (5 mg total) by mouth every 6 (six) hours as needed for moderate pain or breakthrough pain. 20 tablet 0   prochlorperazine (COMPAZINE) 10 MG tablet Take 1 tablet (10 mg total) by mouth every 6 (six) hours as needed for nausea or vomiting. 30 tablet 1   sucralfate (CARAFATE) 1 g tablet Take 1 tablet (1 g total) by mouth 2 (two) times daily. 60 tablet 0   tadalafil (CIALIS) 20 MG tablet Take 20 mg by mouth daily as needed for erectile dysfunction.     No current facility-administered medications for this visit.    REVIEW OF SYSTEMS:   10 Point review of Systems was done is negative except as noted above.  PHYSICAL EXAMINATION: .BP 126/82 (BP Location: Left Arm, Patient Position: Sitting)   Pulse (!) 111   Temp 98.2 F (36.8 C) (Temporal)   Resp 19   Ht 6' (1.829 m)   Wt 263 lb 9 oz (119.6 kg)   SpO2 100%   BMI 35.75 kg/m  . GENERAL:alert, in no acute distress and comfortable SKIN: no acute rashes, no significant lesions EYES: conjunctiva are pink and non-injected, sclera anicteric OROPHARYNX: MMM, no exudates, no oropharyngeal erythema or ulceration NECK: supple, no JVD LYMPH:  no palpable lymphadenopathy in the cervical, axillary or inguinal regions LUNGS: clear to  auscultation b/l with normal respiratory effort HEART: regular rate & rhythm ABDOMEN:  normoactive bowel sounds , non tender, not distended. Extremity: no pedal edema PSYCH: alert & oriented x 3 with fluent speech NEURO: no focal motor/sensory deficits   LABORATORY DATA:   I have reviewed the data as listed    Latest Ref Rng & Units 01/30/2023   11:56 AM 01/10/2023   12:16 PM 12/19/2022   11:37 AM  CBC  WBC 4.0 - 10.5 K/uL 4.0  6.8  5.5   Hemoglobin 13.0 - 17.0 g/dL 8.9  8.2  9.7   Hematocrit 39.0 - 52.0 % 28.0  24.7  28.0   Platelets 150 - 400 K/uL 404  484  348    CBC    Component Value Date/Time   WBC 4.0 01/30/2023 1156   WBC 6.5 07/19/2022 1400   RBC 3.05 (L) 01/30/2023 1156   HGB 8.9 (L) 01/30/2023 1156   HCT 28.0 (L) 01/30/2023 1156   PLT 404 (H) 01/30/2023 1156   MCV 91.8 01/30/2023 1156   MCV 87.3 12/22/2017 1518   MCH 29.2 01/30/2023 1156   MCHC 31.8 01/30/2023 1156   RDW 16.5 (H) 01/30/2023 1156   LYMPHSABS  1.0 01/30/2023 1156   MONOABS 0.5 01/30/2023 1156   EOSABS 0.1 01/30/2023 1156   BASOSABS 0.0 01/30/2023 1156       Latest Ref Rng & Units 01/30/2023   11:56 AM 01/10/2023   12:16 PM 12/19/2022   11:37 AM  CMP  Glucose 70 - 99 mg/dL 161  096  045   BUN 8 - 23 mg/dL 26  56  17   Creatinine 0.61 - 1.24 mg/dL 4.09  8.11  9.14   Sodium 135 - 145 mmol/L 138  133  136   Potassium 3.5 - 5.1 mmol/L 4.4  5.0  4.5   Chloride 98 - 111 mmol/L 103  99  99   CO2 22 - 32 mmol/L 29  25  29    Calcium 8.9 - 10.3 mg/dL 78.2  95.6  9.6   Total Protein 6.5 - 8.1 g/dL 7.8  8.1  7.8   Total Bilirubin 0.3 - 1.2 mg/dL 0.4  0.4  0.4   Alkaline Phos 38 - 126 U/L 74  74  66   AST 15 - 41 U/L 14  13  11    ALT 0 - 44 U/L 12  12  7     . Lab Results  Component Value Date   LDH 168 05/18/2022   Tsh 1.42 (05/21/2021)   01/02/18 Left Inguinal LN Bx:   12/26/17 Tissue Flow Cytometry:   12/26/17 Inguinal Core biopsy:      RADIOGRAPHIC STUDIES: I have personally  reviewed the radiological images as listed and agreed with the findings in the report. No results found.  ASSESSMENT & PLAN:   This is a pleasant 67 y.o. 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   CT of the abdomen and pelvis performed on 12/22/2017 showed bulky left inguinal, left hemipelvic, and  retroperitoneal adenopathy.     01/02/18 Left inguinal LN Biopsy revealed T-Cell/histocyte rich Large B-Cell Lymphoma   12/27/17 ECHO revealed LV EF of 55-60%    01/05/18 PET/CT revealed Massively enlarged pelvic lymph nodes intense metabolic activity consistent lymphoma. 2. Additional hypermetabolic lymph nodes in the porta hepatis and retroperitoneum LEFT aorta. 3. Solitary hypermetabolic mediastinal lymph node in the upper LEFT Mediastinum. 4. 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 5. Normal spleen.     HIV non reactive on 12/22/2017.  Hep C and hep B serology negative.   03/14/18 PET/CT revealed PET-CT findings suggest an excellent response to chemotherapy. The abdominal lymphadenopathy has near completely resolved and demonstrates a near complete metabolic response. The pelvic and inguinal adenopathy has significantly decreased in size and the metabolic activity has significantly decreased. 2. Diffuse marrow activity likely due to chemotherapy and or marrow stimulating drugs. I do not see any discrete persistent lesions.    04/17/18 CT Head revealed Subtle mesial caudothalamic hypodensities may be artifact though, the could reflect encephalitis or Wernicke's encephalopathy. Consider MRI of the head with and without contrast. 2. Mild chronic small vessel ischemic changes    S/p 6 cycles of  EPOCH-R completed on 06/01/18  06/28/18 PET/CT revealed Continued good response to treatment. No residual measurable or hypermetabolic abdominal lymphadenopathy and no recurrent osseous disease. 2. Interval decrease in size of the left operator region lymph node and also the left inguinal lymph node. However, the  both have small foci of slightly increased hypermetabolism which bears surveillance. 3. No new or progressive lymphadenopathy in the neck, chest, abdomen or pelvis.  S/p 39.6 Gy in 22 fractions between 07/25/18 and 08/23/18  02/13/2019 CT C/A/P ([id number]) ([id number]) revealed "1. Response to therapy of pelvic adenopathy compared to the PET of 06/28/2018. 2. No new or progressive disease. 3. Mild prostatomegaly. 4. 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 5. Hepatomegaly."   2) left lower extremity swelling- now resolved  Doppler ultrasound for DVT was negative in the left lower extremity.   Likely from venous compression +/- lymphatic obstruction from bulky left inguinal, left hemipelvic, and  retroperitoneal adenopathy.    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  6) Radiation mucositis  PLAN: -Labs from today, 01/30/2023, reviewed with patient CBC/diff hgb improved to 8.9 from 8.2 Cmp creatinine improved from 1.95 to 1.27 TSNa dn T4 --wnl -Recommend influenza vaccine, COVID-19 Booster, and other age related vaccines.   -Patient has been tolerating his treatment well without any new or severe toxicities.  -Patient will continue to receive immunotherapy  (Pembrolizumab) without any dose modification.  -continue f/u with rad onc per their recommendations -- had final RT session on 01/13/2023 and has impoving radiation mucositis. -Advised patient to eat well and stay well hydrated.   FOLLOW-UP: -Continue Keytruda per integrated scheduling with port flush labs and MD visits   The total time spent in the appointment was 30 minutes* .  All of the patient's questions were answered with apparent satisfaction. The patient knows to call the clinic with any problems, questions or concerns.   Wyvonnia Lora MD MS AAHIVMS [facility] Hematology/Oncology Physician [facility]  .*Total Encounter Time as defined by the Centers for Medicare and Medicaid Services includes, in addition to the face-to-face time of a patient visit (documented in the note above) non-face-to-face time: obtaining and reviewing outside history, ordering and reviewing medications, tests or procedures, care coordination (communications with other health care professionals or caregivers) and documentation in the medical record.   I,Param Shah,acting as a Neurosurgeon for Wyvonnia Lora, MD.,have documented all relevant documentation on the behalf of Wyvonnia Lora, MD,as directed by  Wyvonnia Lora, MD while in the presence of Wyvonnia Lora, MD.  .I have reviewed the above documentation for accuracy and completeness, and I agree with the above. Johney Maine MD

## 2023-01-30 NOTE — Progress Notes (Signed)
Patient seen by Dr. Addison Naegeli are within treatment parameters.  Labs reviewed: and are within treatment parameters. CMP still processing  Per physician team, patient is ready for treatment and there are NO modifications to the treatment plan.

## 2023-01-30 NOTE — Progress Notes (Signed)
Dr. Candise Che aware of patient heart rate fluctuations. HR between 35-108- Patient did have some light-headedness this morning.

## 2023-01-31 ENCOUNTER — Ambulatory Visit (HOSPITAL_COMMUNITY)
Admission: RE | Admit: 2023-01-31 | Discharge: 2023-01-31 | Disposition: A | Discharge status: Home / Self Care | Payer: Medicare HMO | Source: Ambulatory Visit | Attending: Hematology | Admitting: Hematology

## 2023-01-31 ENCOUNTER — Ambulatory Visit (HOSPITAL_COMMUNITY)
Admission: RE | Admit: 2023-01-31 | Discharge: 2023-01-31 | Disposition: A | Discharge status: Home / Self Care | Payer: Medicare HMO | Source: Ambulatory Visit | Attending: Hematology

## 2023-01-31 DIAGNOSIS — R1310 Dysphagia, unspecified: Secondary | ICD-10-CM | POA: Diagnosis not present

## 2023-01-31 DIAGNOSIS — Z8521 Personal history of malignant neoplasm of larynx: Secondary | ICD-10-CM | POA: Insufficient documentation

## 2023-01-31 DIAGNOSIS — Z9221 Personal history of antineoplastic chemotherapy: Secondary | ICD-10-CM | POA: Diagnosis not present

## 2023-01-31 DIAGNOSIS — C320 Malignant neoplasm of glottis: Secondary | ICD-10-CM

## 2023-01-31 DIAGNOSIS — J449 Chronic obstructive pulmonary disease, unspecified: Secondary | ICD-10-CM | POA: Insufficient documentation

## 2023-01-31 DIAGNOSIS — Z923 Personal history of irradiation: Secondary | ICD-10-CM | POA: Diagnosis not present

## 2023-01-31 LAB — T4: T4, Total: 7.2 ug/dL (ref 4.5–12.0)

## 2023-02-05 ENCOUNTER — Encounter: Payer: Self-pay | Admitting: Hematology

## 2023-02-20 ENCOUNTER — Ambulatory Visit: Payer: Medicare HMO

## 2023-02-20 ENCOUNTER — Ambulatory Visit: Payer: Medicare HMO | Admitting: Hematology

## 2023-02-20 ENCOUNTER — Other Ambulatory Visit: Payer: Medicare HMO

## 2023-02-21 ENCOUNTER — Inpatient Hospital Stay: Payer: Medicare HMO

## 2023-02-21 ENCOUNTER — Inpatient Hospital Stay (HOSPITAL_BASED_OUTPATIENT_CLINIC_OR_DEPARTMENT_OTHER): Payer: Medicare HMO | Admitting: Hematology

## 2023-02-21 ENCOUNTER — Inpatient Hospital Stay: Payer: Medicare HMO | Attending: Hematology

## 2023-02-21 VITALS — BP 122/85 | HR 85 | Temp 98.5°F | Resp 20 | Wt 277.3 lb

## 2023-02-21 DIAGNOSIS — C320 Malignant neoplasm of glottis: Secondary | ICD-10-CM | POA: Diagnosis not present

## 2023-02-21 DIAGNOSIS — Z7189 Other specified counseling: Secondary | ICD-10-CM | POA: Diagnosis not present

## 2023-02-21 DIAGNOSIS — Z923 Personal history of irradiation: Secondary | ICD-10-CM | POA: Diagnosis not present

## 2023-02-21 DIAGNOSIS — Z79899 Other long term (current) drug therapy: Secondary | ICD-10-CM | POA: Diagnosis not present

## 2023-02-21 DIAGNOSIS — Z9221 Personal history of antineoplastic chemotherapy: Secondary | ICD-10-CM | POA: Insufficient documentation

## 2023-02-21 DIAGNOSIS — Z5111 Encounter for antineoplastic chemotherapy: Secondary | ICD-10-CM

## 2023-02-21 DIAGNOSIS — C760 Malignant neoplasm of head, face and neck: Secondary | ICD-10-CM

## 2023-02-21 DIAGNOSIS — Z5112 Encounter for antineoplastic immunotherapy: Secondary | ICD-10-CM | POA: Insufficient documentation

## 2023-02-21 DIAGNOSIS — Z87891 Personal history of nicotine dependence: Secondary | ICD-10-CM | POA: Insufficient documentation

## 2023-02-21 DIAGNOSIS — Z95828 Presence of other vascular implants and grafts: Secondary | ICD-10-CM

## 2023-02-21 DIAGNOSIS — Z8572 Personal history of non-Hodgkin lymphomas: Secondary | ICD-10-CM | POA: Insufficient documentation

## 2023-02-21 DIAGNOSIS — Z7982 Long term (current) use of aspirin: Secondary | ICD-10-CM | POA: Insufficient documentation

## 2023-02-21 LAB — CBC WITH DIFFERENTIAL (CANCER CENTER ONLY)
Abs Immature Granulocytes: 0.02 10*3/uL (ref 0.00–0.07)
Basophils Absolute: 0 10*3/uL (ref 0.0–0.1)
Basophils Relative: 1 %
Eosinophils Absolute: 0.2 10*3/uL (ref 0.0–0.5)
Eosinophils Relative: 3 %
HCT: 31.3 % — ABNORMAL LOW (ref 39.0–52.0)
Hemoglobin: 10 g/dL — ABNORMAL LOW (ref 13.0–17.0)
Immature Granulocytes: 0 %
Lymphocytes Relative: 25 %
Lymphs Abs: 1.3 10*3/uL (ref 0.7–4.0)
MCH: 29.5 pg (ref 26.0–34.0)
MCHC: 31.9 g/dL (ref 30.0–36.0)
MCV: 92.3 fL (ref 80.0–100.0)
Monocytes Absolute: 0.6 10*3/uL (ref 0.1–1.0)
Monocytes Relative: 11 %
Neutro Abs: 3.1 10*3/uL (ref 1.7–7.7)
Neutrophils Relative %: 60 %
Platelet Count: 332 10*3/uL (ref 150–400)
RBC: 3.39 MIL/uL — ABNORMAL LOW (ref 4.22–5.81)
RDW: 15.7 % — ABNORMAL HIGH (ref 11.5–15.5)
WBC Count: 5.3 10*3/uL (ref 4.0–10.5)
nRBC: 0 % (ref 0.0–0.2)

## 2023-02-21 LAB — CMP (CANCER CENTER ONLY)
ALT: 7 U/L (ref 0–44)
AST: 10 U/L — ABNORMAL LOW (ref 15–41)
Albumin: 4.2 g/dL (ref 3.5–5.0)
Alkaline Phosphatase: 79 U/L (ref 38–126)
Anion gap: 7 (ref 5–15)
BUN: 16 mg/dL (ref 8–23)
CO2: 28 mmol/L (ref 22–32)
Calcium: 9.7 mg/dL (ref 8.9–10.3)
Chloride: 102 mmol/L (ref 98–111)
Creatinine: 1.03 mg/dL (ref 0.61–1.24)
GFR, Estimated: >60 mL/min (ref >60)
Glucose, Bld: 152 mg/dL — ABNORMAL HIGH (ref 70–99)
Potassium: 4.3 mmol/L (ref 3.5–5.1)
Sodium: 137 mmol/L (ref 135–145)
Total Bilirubin: 0.4 mg/dL (ref 0.3–1.2)
Total Protein: 7.6 g/dL (ref 6.5–8.1)

## 2023-02-21 LAB — TSH: TSH: 2.202 u[IU]/mL (ref 0.350–4.500)

## 2023-02-21 MED ORDER — HEPARIN SOD (PORK) LOCK FLUSH 100 UNIT/ML IV SOLN
500.0000 [IU] | Freq: Once | INTRAVENOUS | Status: AC | PRN
  Administered 2023-02-21: 500 [IU]

## 2023-02-21 MED ORDER — SODIUM CHLORIDE 0.9% FLUSH
10.0000 mL | INTRAVENOUS | Status: DC | PRN
  Administered 2023-02-21: 10 mL

## 2023-02-21 MED ORDER — SODIUM CHLORIDE 0.9 % IV SOLN
200.0000 mg | Freq: Once | INTRAVENOUS | Status: AC
  Administered 2023-02-21: 200 mg via INTRAVENOUS
  Filled 2023-02-21: qty 200

## 2023-02-21 MED ORDER — SODIUM CHLORIDE 0.9 % IV SOLN: Freq: Once | INTRAVENOUS | Status: AC

## 2023-02-21 MED ORDER — SODIUM CHLORIDE 0.9% FLUSH
10.0000 mL | Freq: Once | INTRAVENOUS | Status: AC
  Administered 2023-02-21: 10 mL

## 2023-02-21 NOTE — Progress Notes (Signed)
Patient seen by Dr. Kale  Vitals are within treatment parameters.  Labs reviewed: and are within treatment parameters.  Per physician team, patient is ready for treatment and there are NO modifications to the treatment plan.  

## 2023-02-21 NOTE — Patient Instructions (Signed)
[place] [facility]  Discharge Instructions: Thank you for choosing [facility] to provide your oncology and hematology care.   If you have a lab appointment with the Cancer Center, please go directly to the Cancer Center and check in at the registration area.   Wear comfortable clothing and clothing appropriate for easy access to any Portacath or PICC line.   We strive to give you quality time with your provider. You may need to reschedule your appointment if you arrive late (15 or more minutes).  Arriving late affects you and other patients whose appointments are after yours.  Also, if you miss three or more appointments without notifying the office, you may be dismissed from the clinic at the provider's discretion.      For prescription refill requests, have your pharmacy contact our office and allow 72 hours for refills to be completed.    Today you received the following chemotherapy and/or immunotherapy agents: Keytruda      To help prevent nausea and vomiting after your treatment, we encourage you to take your nausea medication as directed.  BELOW ARE SYMPTOMS THAT SHOULD BE REPORTED IMMEDIATELY: *FEVER GREATER THAN 100.4 F (38 C) OR HIGHER *CHILLS OR SWEATING *NAUSEA AND VOMITING THAT IS NOT CONTROLLED WITH YOUR NAUSEA MEDICATION *UNUSUAL SHORTNESS OF BREATH *UNUSUAL BRUISING OR BLEEDING *URINARY PROBLEMS (pain or burning when urinating, or frequent urination) *BOWEL PROBLEMS (unusual diarrhea, constipation, pain near the anus) TENDERNESS IN MOUTH AND THROAT WITH OR WITHOUT PRESENCE OF ULCERS (sore throat, sores in mouth, or a toothache) UNUSUAL RASH, SWELLING OR PAIN  UNUSUAL VAGINAL DISCHARGE OR ITCHING   Items with * indicate a potential emergency and should be followed up as soon as possible or go to the Emergency Department if any problems should occur.  Please show the CHEMOTHERAPY ALERT CARD or IMMUNOTHERAPY ALERT CARD at  check-in to the Emergency Department and triage nurse.  Should you have questions after your visit or need to cancel or reschedule your appointment, please contact [place] [facility]  Dept: [phone]  and follow the prompts.  Office hours are 8:00 a.m. to 4:30 p.m. Monday - Friday. Please note that voicemails left after 4:00 p.m. may not be returned until the following business day.  We are closed weekends and major holidays. You have access to a nurse at all times for urgent questions. Please call the main number to the clinic Dept: [phone] and follow the prompts.   For any non-urgent questions, you may also contact your provider using MyChart. We now offer e-Visits for anyone 32 and older to request care online for non-urgent symptoms. For details visit [email or web address].   Also download the MyChart app! Go to the app store, search "MyChart", open the app, select [place], and log in with your MyChart username and password.

## 2023-02-21 NOTE — Progress Notes (Signed)
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  HISTORY OF PRESENTING ILLNESS:   Jesus [name] 67 y.o. male is here because of left lower extremity edema and lymphadenopathy.  The patient was seen in the emergency room this past Friday for the same issue.  A CT of the abdomen and pelvis was performed showing bulky left inguinal, left hemipelvic, and retroperitoneal adenopathy.  He was referred to [place] from the emergency room for further evaluation.  Doppler ultrasound of the left lower extremity was performed and was negative for DVT. Patient reports that he has been having left lower extremity edema in his left groin and left leg for approximately 1 month.  He states that the swelling in the left groin started to get better but then worsened.  The left lower extremity edema has slowly worsened over time.  Patient denies having fevers and chills.  He reports that he does have night sweats at times.  He reported having [place] 1 [place]  He thinks his headaches are related to not having his blood pressure medication.  His headaches have now resolved.  He denies visual changes.  The patient denies chest pain, shortness of breath and cough.  No nausea, vomiting, constipation, diarrhea.  Denies abdominal pain.  The patient denies recent weight loss and has actually gained weight  recently.  Patient denies epistaxis, bleeding gums, hemoptysis, hematuria, but occasionally, and melena.  He reports increased urinary frequency over the past month but no dysuria.  The patient is here for evaluation and discussion of his recent CT and lab findings.  Interval History:  Jesus [name] 67 y.o. male who is here for continued evaluation and management of newly noted metastatic laryngeal squamous cell carcinoma. He is here to start cycle 10 day 1 of Pembrolizumab.  Patient was last seen by me on 01/30/2023 and he was doing well overall.   Patient notes he has been doing well overall without any new or severe medical concerns since our last visit. He denies any new infection issues, fever, chills, night sweats, chest pain, abdominal pain, back pain, or leg swelling. He notes that his swallowing problem has improved since our last visit. He has been eating well and has gained weight since last visit.   During this visit, he complains of occasional soreness near her port-a-cath site and chest area. He denies any redness or pain.   Patient notes his energy have improved.   Patient tolerated cycle 9 of his treatment well without any new or severe toxicities.   He notes that his blood pressure has been elevated since he has gained weight. His PCP has started the patient on Amlodipine. During this visit, his blood pressure is in the normal range at 122/85.  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- last radiation 06-2018   Cataract    removed both eyes with l;ens implants    COPD (chronic obstructive pulmonary disease) (HCC)    Family history of colon cancer    in his brother- dx'd age 9    History of chemotherapy    last 06-01-2018   History of kidney stones     History of radiation therapy    last radiation 06-2018   Hyperlipidemia    currently under control   Hypertension    Irregular heart beats    Lymphadenopathy    Pain, lower leg    Bilateral   Peripheral arterial disease (HCC)    Pre-diabetes    Red-green color blindness    RLS (restless legs syndrome)    Snores    Wears glasses     SURGICAL HISTORY: Past Surgical History:  Procedure Laterality Date   CATARACT EXTRACTION W/ INTRAOCULAR LENS  IMPLANT, BILATERAL     COLONOSCOPY     DIRECT LARYNGOSCOPY Right 02/03/2022   Procedure: DIRECT LARYNGOSCOPY WITH BIOPSY OF RIGHT FALSE VOCAL CORD;  Surgeon: Osborn Coho, MD;  Location: [facility] OR;  Service: ENT;  Laterality: Right;   dislodged salava stone     FRACTURE SURGERY     HAND ARTHROPLASTY  1995   crushed left hand   INGUINAL LYMPH NODE BIOPSY Left 01/02/2018   Procedure: LEFT INGUINAL LYMPH NODE BIOPSY;  Surgeon: Emelia Loron, MD;  Location: [facility] OR;  Service: General;  Laterality: Left;   IR IMAGING GUIDED PORT INSERTION  01/15/2018   IR IMAGING GUIDED PORT INSERTION  08/10/2022   IR REMOVAL TUN ACCESS W/ PORT W/O FL MOD SED  03/11/2019   MEDIASTINOSCOPY N/A 07/22/2022   Procedure: MEDIASTINOSCOPY;  Surgeon: Loreli Slot, MD;  Location: [facility] OR;  Service: Thoracic;  Laterality: N/A;   MICROLARYNGOSCOPY Left 01/17/2014   Procedure: MICROLARYNGOSCOPY WITH EXCISION OF THE BIOPSY OF LEFT VOCAL CORD LESION;  Surgeon: Serena Colonel, MD;  Location: [facility];  Service: ENT;  Laterality: Left;   MICROLARYNGOSCOPY N/A 09/16/2020   Procedure: MICROLARYNGOSCOPY with Biopsy of vocal cord lesion;  Surgeon: Osborn Coho, MD;  Location: [facility] OR;  Service: ENT;  Laterality: N/A;   ORIF FOOT FRACTURE  2005   left   REFRACTIVE SURGERY Right    removed cloudiness in right eye after cataract removal     SOCIAL HISTORY: Social History   Socioeconomic History   Marital status: Divorced    Spouse name: Not on file   Number  of children: 3   Years of education: Not on file   Highest education level: Not on file  Occupational History   Not on file  Tobacco Use   Smoking status: Former    Current packs/day: 0.00    Average packs/day: 0.5 packs/day for 36.0 years (18.0 ttl pk-yrs)    Types: Cigarettes    Start date: 09/28/1984    Quit date: 09/28/2020    Years since quitting: 2.4   Smokeless tobacco: Never   Tobacco comments:    he denies smoking in about 2 weeks 07/13/18  Vaping Use   Vaping status: Never Used  Substance and Sexual Activity   Alcohol use: Yes    Alcohol/week: 6.0 standard drinks of alcohol    Types: 6 Cans of beer per week    Comment: Last use: Friday 30th  Drug use: Yes    Types: Marijuana, Cocaine    Comment: Last [facility] Use: Friday 30th.   Sexual activity: Not Currently  Other Topics Concern   Not on file  Social History Narrative   Not on file   Social Determinants of Health   Financial Resource Strain: Low Risk  (10/06/2020)   Overall Financial Resource Strain (CARDIA)    Difficulty of Paying Living Expenses: Not very hard  Food Insecurity: No Food Insecurity (11/09/2022)   Hunger Vital Sign    Worried About Running Out of Food in the Last Year: Never true    Ran Out of Food in the Last Year: Never true  Transportation Needs: No Transportation Needs (11/09/2022)   PRAPARE - Administrator, Civil Service (Medical): No    Lack of Transportation (Non-Medical): No  Physical Activity: Not on file  Stress: Not on file  Social Connections: Moderately Integrated (10/06/2020)   Social Connection and Isolation Panel [NHANES]    Frequency of Communication with Friends and Family: Three times a week    Frequency of Social Gatherings with Friends and Family: Three times a week    Attends Religious Services: 1 to 4 times per year    Active Member of Clubs or Organizations: Yes    Attends Banker Meetings: 1 to 4 times per year    Marital Status: Divorced  Intimate  Partner Violence: Not At Risk (11/09/2022)   Humiliation, Afraid, Rape, and Kick questionnaire    Fear of Current or Ex-Partner: No    Emotionally Abused: No    Physically Abused: No    Sexually Abused: No    FAMILY HISTORY: Family History  Problem Relation Age of Onset   Breast cancer Mother    Diabetes Father    Hypertension Father    Stroke Father    Mental illness Sister    Hypertension Daughter    Mental illness Daughter    Hypertension Brother    Colon cancer Brother 44       passed away 2018/12/23   Breast cancer Sister    Esophageal cancer Neg Hx    Colon polyps Neg Hx    Rectal cancer Neg Hx    Stomach cancer Neg Hx     ALLERGIES:  is allergic to bee venom, lisinopril, antifungal [miconazole nitrate], and zolpidem tartrate er.  MEDICATIONS:  Current Outpatient Medications  Medication Sig Dispense Refill   ACCU-CHEK GUIDE test strip 1 each by Other route as needed for other.     Accu-Chek Softclix Lancets lancets      acetaminophen (TYLENOL) 500 MG tablet Take 1,500 mg by mouth daily as needed for moderate pain.     amLODipine (NORVASC) 10 MG tablet TAKE 1 TABLET BY MOUTH EVERY DAY (Patient not taking: Reported on 01/11/2023) 30 tablet 0   aspirin EC 81 MG tablet Take 81 mg by mouth every evening. Swallow whole.     b complex vitamins capsule Take 1 capsule by mouth daily.     HYDROcodone-acetaminophen (HYCET) 7.5-325 mg/15 ml solution Take 10-15 mLs by mouth 4 (four) times daily as needed for moderate pain. Take with food. 300 mL 0   lidocaine (XYLOCAINE) 2 % solution Patient: Mix 1part 2% viscous lidocaine, 1part H20. Swallow 10mL of diluted mixture, before meals and at bedtime, up to QID to help sore throat 200 mL 3   lidocaine-prilocaine (EMLA) cream Apply to affected area once 30 g 3   lisinopril (ZESTRIL) 20  MG tablet TAKE 1 TABLET BY MOUTH EVERY DAY (Patient taking differently: Take 20 mg by mouth every evening.) 30 tablet 0   magic mouthwash w/lidocaine SOLN  Take 5 mLs by mouth 4 (four) times daily as needed for mouth pain. Compound 400 mL with the following 80 ML of distilled water 80 mL of Maalox 80 mL of nystatin 500,000 units per 5 mL 80 mL of 2% viscous lidocaine 80 mL of Benadryl 12.5mg  per 5 mL 400 mL 0   metFORMIN (GLUCOPHAGE) 500 MG tablet Take 500 mg by mouth 2 (two) times daily with a meal.     Multiple Vitamins-Minerals (MULTI ADULT GUMMIES PO) Take 1 tablet by mouth in the morning. Centrum     ondansetron (ZOFRAN) 8 MG tablet Take 1 tablet (8 mg total) by mouth every 8 (eight) hours as needed for nausea or vomiting. Start on the third day after carboplatin. 30 tablet 1   oxyCODONE (OXY IR/ROXICODONE) 5 MG immediate release tablet Take 1 tablet (5 mg total) by mouth every 6 (six) hours as needed for moderate pain or breakthrough pain. 20 tablet 0   prochlorperazine (COMPAZINE) 10 MG tablet Take 1 tablet (10 mg total) by mouth every 6 (six) hours as needed for nausea or vomiting. 30 tablet 1   sucralfate (CARAFATE) 1 g tablet Take 1 tablet (1 g total) by mouth 2 (two) times daily. 60 tablet 0   tadalafil (CIALIS) 20 MG tablet Take 20 mg by mouth daily as needed for erectile dysfunction.     No current facility-administered medications for this visit.    REVIEW OF SYSTEMS:   10 Point review of Systems was done is negative except as noted above.  PHYSICAL EXAMINATION: .BP 122/85   Pulse 85   Temp 98.5 F (36.9 C)   Resp 20   Wt 277 lb 4.8 oz (125.8 kg)   SpO2 100%   BMI 37.61 kg/m  . GENERAL:alert, in no acute distress and comfortable SKIN: no acute rashes, no significant lesions EYES: conjunctiva are pink and non-injected, sclera anicteric OROPHARYNX: MMM, no exudates, no oropharyngeal erythema or ulceration NECK: supple, no JVD LYMPH:  no palpable lymphadenopathy in the cervical, axillary or inguinal regions LUNGS: clear to auscultation b/l with normal respiratory effort HEART: regular rate & rhythm ABDOMEN:  normoactive  bowel sounds , non tender, not distended. Extremity: no pedal edema PSYCH: alert & oriented x 3 with fluent speech NEURO: no focal motor/sensory deficits   LABORATORY DATA:   I have reviewed the data as listed    Latest Ref Rng & Units 02/21/2023   12:42 PM 01/30/2023   11:56 AM 01/10/2023   12:16 PM  CBC  WBC 4.0 - 10.5 K/uL 5.3  4.0  6.8   Hemoglobin 13.0 - 17.0 g/dL 40.9  8.9  8.2   Hematocrit 39.0 - 52.0 % 31.3  28.0  24.7   Platelets 150 - 400 K/uL 332  404  484    CBC    Component Value Date/Time   WBC 5.3 02/21/2023 1242   WBC 6.5 07/19/2022 1400   RBC 3.39 (L) 02/21/2023 1242   HGB 10.0 (L) 02/21/2023 1242   HCT 31.3 (L) 02/21/2023 1242   PLT 332 02/21/2023 1242   MCV 92.3 02/21/2023 1242   MCV 87.3 12/22/2017 1518   MCH 29.5 02/21/2023 1242   MCHC 31.9 02/21/2023 1242   RDW 15.7 (H) 02/21/2023 1242   LYMPHSABS 1.3 02/21/2023 1242   MONOABS 0.6 02/21/2023 1242  EOSABS 0.2 02/21/2023 1242   BASOSABS 0.0 02/21/2023 1242       Latest Ref Rng & Units 02/21/2023   12:42 PM 01/30/2023   11:56 AM 01/10/2023   12:16 PM  CMP  Glucose 70 - 99 mg/dL 454  098  119   BUN 8 - 23 mg/dL 16  26  56   Creatinine 0.61 - 1.24 mg/dL 1.47  8.29  5.62   Sodium 135 - 145 mmol/L 137  138  133   Potassium 3.5 - 5.1 mmol/L 4.3  4.4  5.0   Chloride 98 - 111 mmol/L 102  103  99   CO2 22 - 32 mmol/L 28  29  25    Calcium 8.9 - 10.3 mg/dL 9.7  13.0  86.5   Total Protein 6.5 - 8.1 g/dL 7.6  7.8  8.1   Total Bilirubin 0.3 - 1.2 mg/dL 0.4  0.4  0.4   Alkaline Phos 38 - 126 U/L 79  74  74   AST 15 - 41 U/L 10  14  13    ALT 0 - 44 U/L 7  12  12     . Lab Results  Component Value Date   LDH 168 05/18/2022   Tsh 1.42 (05/21/2021)   01/02/18 Left Inguinal LN Bx:   12/26/17 Tissue Flow Cytometry:   12/26/17 Inguinal Core biopsy:      RADIOGRAPHIC STUDIES: I have personally reviewed the radiological images as listed and agreed with the findings in the report. DG SWALLOW FUNC OP  MEDICARE SPEECH PATH  Result Date: 01/31/2023 Table formatting from the original result was not included. 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   Patient has undergone prior cancer treatments including chemoradiation.  Last round of radiation was completed 01/12/2022 and he currently is on Keytruda.  Patient is a prior smoker and he quit 09/2020.  He is undergoing outpatient speech pathology for his dysphagia.  Recent COVID 01/17/2023.  Chest x-ray for 2024 showed left paratracheal opacity with tracheal deviation to the right, history of left paratracheal enlarged lymph node.  Per notes patient drinks 3 Gatorade's daily has a good appetite but has had tremendous weight loss due to his dysphagia during treatment.  He reports that his swallowing and throat pain has improved significantly since being done with radiation.  He endorses occasional issues swallowing foods more than liquids feeling food sticking in his throat pointing to left distal pharynx. He denies being able to cough and expectorate foods - stating sometimes he eats something to help his pills clear.  Denies requiring heimlich manuever nor having recurrent pnas.  Patient admits to occasional neck spasms especially when he yawns but states this is also getting better.  He turns his head to the right with swallowing stating this is helpful for him to clear.  Clinical Impression: Clinical Impression: Patient presents with mild pharyngeal base dysphagia mostly characterized by impaired laryngeal elevation and closure allowing inconsistent laryngeal penetration  and trace (SILENT) aspiration of thin liquid that appeared mixed with secretions.  Cued cough cleared trace aspirates fortunately. Chin tuck worsened airway protection as boluses at the piriform sinus spilled into  open larynx and subsequently trachea.  Pharyngeal swallow is strong fortunately without retention-- pharyngeal difficulties only noted with liquids.  Patient was able to swallow barium tablet with thin liquid without difficulties-nor penetration or aspiration of liquid.  An AP view, nectar barium transited via right side of pharynx, but patient may have been leaning slightly to the right -potentially accounting for this occurrence. Patient's symptoms of sensing food or pills lodging was not redemonstrated during today's evaluation.  He has history of tracheal deviation to the right due to left paratracheal enlarged lymph node per chest x-ray from April and SLP questions if this may cause occasional esophageal dysphagia that may account for his symptoms. Educated patient to recommendations using teach back and review of fluoroscopy loops.  Recommend continue diet with precautions including occasional cough after thin liquids swallow.  Continue skilled SLP to address laryngeal elevation and closure to maximize airway protection with p.o.  Thank you for this consult. Factors that may increase risk of adverse event in presence of aspiration Rubye Oaks & Clearance Coots 2021): No data recorded Recommendations/Plan: Swallowing Evaluation Recommendations Swallowing Evaluation Recommendations Recommendations: PO diet PO Diet Recommendation: Regular; Thin liquids (Level 0) Liquid Administration via: Straw; Cup Medication Administration: -- (As tolerated) Supervision: Patient able to self-feed Swallowing strategies  : Small bites/sips; Slow rate (Intermittent cough after liquids) Postural changes: Position pt fully upright for meals; Stay upright 30-60 min after meals Oral care recommendations: Oral care BID (2x/day) Treatment  Plan Treatment Plan Treatment recommendations: Defer treatment plan to SLP at other venue (see follow-up recommendations) Follow-up recommendations: Outpatient SLP Recommendations Recommendations for follow up therapy are one component of a multi-disciplinary discharge planning process, led by the attending physician.  Recommendations may be updated based on patient status, additional functional criteria and insurance authorization. Assessment: Orofacial Exam: Orofacial Exam Oral Cavity: Oral Hygiene: WFL Oral Cavity - Dentition: Adequate natural dentition Orofacial Anatomy: WFL Oral Motor/Sensory Function: WFL Anatomy: Anatomy: Suspected cervical osteophytes (? appeareance of anterior cervical web type-structure near C5-C6, did not impair barium flow) Boluses Administered: Boluses Administered Boluses Administered: Thin liquids (Level 0); Mildly thick liquids (Level 2, nectar thick); Moderately thick liquids (Level 3, honey thick); Puree; Solid  Oral Impairment Domain: Oral Impairment Domain Lip Closure: No labial escape Tongue control during bolus hold: Cohesive bolus between tongue to palatal seal Bolus preparation/mastication: Timely and efficient chewing and mashing Bolus transport/lingual motion: Brisk tongue motion Oral residue: Trace residue lining oral structures Location of oral residue : Tongue Initiation of pharyngeal swallow : Valleculae; Pyriform sinuses  Pharyngeal Impairment Domain: Pharyngeal Impairment Domain Soft palate elevation: No bolus between soft palate (SP)/pharyngeal wall (PW) Laryngeal elevation: Partial superior movement of thyroid cartilage/partial approximation of arytenoids to epiglottic petiole Anterior hyoid excursion: Complete anterior movement Epiglottic movement: Complete inversion Laryngeal vestibule closure: Incomplete, narrow column air/contrast in laryngeal vestibule Pharyngeal stripping wave : Present - complete Pharyngeal contraction (A/P view only): Complete (boluses flow  to right with A-P position, - pt head slightly turned right- noted tracheal deviation to right in A-P view (noted on prior CXR 08/2022.) Pharyngoesophageal segment opening: Partial distention/partial duration, partial obstruction of flow Tongue base retraction: No contrast between tongue base and posterior pharyngeal wall (PPW) Pharyngeal residue: Trace residue within or on pharyngeal structures  Esophageal Impairment Domain: Esophageal Impairment Domain Esophageal clearance upright position: Complete clearance, esophageal coating Pill: Pill Consistency administered: Thin liquids (Level 0) Thin liquids (Level 0): [facility] Penetration/Aspiration Scale Score: Penetration/Aspiration Scale Score 1.  Material does not enter airway: Pill; Solid;  Puree; Moderately thick liquids (Level 3, honey thick); Mildly thick liquids (Level 2, nectar thick) 8.  Material enters airway, passes BELOW cords without attempt by patient to eject out (silent aspiration) : Thin liquids (Level 0) Compensatory Strategies: Compensatory Strategies Compensatory strategies: Yes Chin tuck: Ineffective Ineffective Chin Tuck: Thin liquid (Level 0) Other(comment): Effective (cued cough to clear trace aspirates) Effective Other(comment): Thin liquid (Level 0)   General Information: No data recorded Diet Prior to this Study: Dysphagia 3 (mechanical soft); Thin liquids (Level 0)   No data recorded  Respiratory Status: WFL   Supplemental O2: None (Room air)   History of Recent Intubation: No  Behavior/Cognition: Alert; Cooperative; Pleasant mood Self-Feeding Abilities: Able to self-feed Baseline vocal quality/speech: -- (Subjectively mildly hoarse) Volitional Cough: Able to elicit Volitional Swallow: Able to elicit Exam Limitations: No limitations Goal Planning: No data recorded No data recorded No data recorded No data recorded No data recorded Pain: Pain Assessment Pain Assessment: No/denies pain End of Session: Start Time:SLP Start Time (ACUTE ONLY): 1315 Stop  Time: SLP Stop Time (ACUTE ONLY): 1350 Time Calculation:SLP Time Calculation (min) (ACUTE ONLY): 35 min Charges: SLP Evaluations $ SLP Speech Visit: 1 Visit SLP Evaluations $Outpatient MBS Swallow: 1 Procedure $Swallowing Treatment: 1 Procedure SLP visit diagnosis: SLP Visit Diagnosis: Dysphagia, pharyngeal phase (R13.13) Past Medical History: Past Medical History: Diagnosis Date  Allergy   Anemia   during chemo  Arthritis   knee   Blood transfusion without reported diagnosis   Cancer (HCC)   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- last radiation 06-2018  Cataract   removed both eyes with l;ens implants   COPD (chronic obstructive pulmonary disease) (HCC)   Family history of colon cancer   in his brother- dx'd age 58   History of chemotherapy   last 06-01-2018  History of kidney stones   History of radiation therapy   last radiation 06-2018  Hyperlipidemia   currently under control  Hypertension   Irregular heart beats   Lymphadenopathy   Pain, lower leg   Bilateral  Peripheral arterial disease (HCC)   Pre-diabetes   Red-green color blindness   RLS (restless legs syndrome)   Snores   Wears glasses  Past Surgical History: Past Surgical History: Procedure Laterality Date  CATARACT EXTRACTION W/ INTRAOCULAR LENS  IMPLANT, BILATERAL    COLONOSCOPY    DIRECT LARYNGOSCOPY Right 02/03/2022  Procedure: DIRECT LARYNGOSCOPY WITH BIOPSY OF RIGHT FALSE VOCAL CORD;  Surgeon: Osborn Coho, MD;  Location: [facility] OR;  Service: ENT;  Laterality: Right;  dislodged salava stone    FRACTURE SURGERY    HAND ARTHROPLASTY  1995  crushed left hand  INGUINAL LYMPH NODE BIOPSY Left 01/02/2018  Procedure: LEFT INGUINAL LYMPH NODE BIOPSY;  Surgeon: Emelia Loron, MD;  Location: [facility] OR;  Service: General;  Laterality: Left;  IR IMAGING GUIDED PORT INSERTION  01/15/2018  IR IMAGING GUIDED PORT INSERTION  08/10/2022  IR REMOVAL TUN ACCESS W/ PORT W/O FL MOD SED  03/11/2019  MEDIASTINOSCOPY N/A 07/22/2022  Procedure:  MEDIASTINOSCOPY;  Surgeon: Loreli Slot, MD;  Location: [facility] OR;  Service: Thoracic;  Laterality: N/A;  MICROLARYNGOSCOPY Left 01/17/2014  Procedure: MICROLARYNGOSCOPY WITH EXCISION OF THE BIOPSY OF LEFT VOCAL CORD LESION;  Surgeon: Serena Colonel, MD;  Location: [facility];  Service: ENT;  Laterality: Left;  MICROLARYNGOSCOPY N/A 09/16/2020  Procedure: MICROLARYNGOSCOPY with Biopsy of vocal cord lesion;  Surgeon: Osborn Coho, MD;  Location: [facility] OR;  Service:  ENT;  Laterality: N/A;  ORIF FOOT FRACTURE  2005  left  REFRACTIVE SURGERY Right   removed cloudiness in right eye after cataract removal  Chales Abrahams 01/31/2023, 5:04 PM Rolena Infante, MS [facility] SLP Acute Rehab Services Office [phone]           CLINICAL DATA:  Dysphagia. History squamous carcinoma of the larynx and lymphoma. Prior radiation therapy and chemotherapy. EXAM: MODIFIED BARIUM SWALLOW TECHNIQUE: Different consistencies of barium were administered orally to the patient by the Speech Pathologist. Imaging of the pharynx was performed in the lateral projection. The radiologist was present in the fluoroscopy room for this study, providing personal supervision. FLUOROSCOPY: Radiation Exposure Index (as provided by the fluoroscopic device): 8.4 mGy Kerma COMPARISON:  None Available. FINDINGS: Vestibular Penetration: Seen with thin liquids when swallowed by cup. This was cleared with cough. Thin liquids were normally swallowed with a spoon. Aspiration: One episode of aspiration was visualized with thin liquid. Swallows of honey consistency, nectar, pudding, puree and solid were normal. There was slight pooling towards the right in the frontal projection at the level of the piriform sinuses with nectar. A tablet was swallowed without difficulty. IMPRESSION: Some episodes of vestibular penetration and one episode of aspiration were visualized with thin liquids. Please refer to the Speech Pathologist's report for complete details and  recommendations. Electronically Signed   By: Irish Lack M.D.   On: 01/31/2023 14:05   ASSESSMENT & PLAN:   This is a pleasant 67 y.o. 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   CT of the abdomen and pelvis performed on 12/22/2017 showed bulky left inguinal, left hemipelvic, and  retroperitoneal adenopathy.     01/02/18 Left inguinal LN Biopsy revealed T-Cell/histocyte rich Large B-Cell Lymphoma   12/27/17 ECHO revealed LV EF of 55-60%    01/05/18 PET/CT revealed Massively enlarged pelvic lymph nodes intense metabolic activity consistent lymphoma. 2. Additional hypermetabolic lymph nodes in the porta hepatis and retroperitoneum LEFT aorta. 3. Solitary hypermetabolic mediastinal lymph node in the upper LEFT Mediastinum. 4. 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 5. Normal spleen.     HIV non reactive on 12/22/2017.  Hep C and hep B serology negative.   03/14/18 PET/CT revealed PET-CT findings suggest an excellent response to chemotherapy. The abdominal lymphadenopathy has near completely resolved and demonstrates a near complete metabolic response. The pelvic and inguinal adenopathy has significantly decreased in size and the metabolic activity has significantly decreased. 2. Diffuse marrow activity likely due to chemotherapy and or marrow stimulating drugs. I do not see any discrete persistent lesions.    04/17/18 CT Head revealed Subtle mesial caudothalamic hypodensities may be artifact though, the could reflect encephalitis or Wernicke's encephalopathy. Consider MRI of the head with and without contrast. 2. Mild chronic small vessel ischemic changes    S/p 6 cycles of EPOCH-R completed on  06/01/18  06/28/18 PET/CT revealed Continued good response to treatment. No residual measurable or hypermetabolic abdominal lymphadenopathy and no recurrent osseous disease. 2. Interval decrease in size of the left operator region lymph node and also the left inguinal lymph node. However, the both have small foci of slightly increased hypermetabolism which bears surveillance. 3. No new or progressive lymphadenopathy in the neck, chest, abdomen or pelvis.  S/p 39.6 Gy in  22 fractions between 07/25/18 and 08/23/18  02/13/2019 CT C/A/P ([id number]) ([id number]) revealed "1. Response to therapy of pelvic adenopathy compared to the PET of 06/28/2018. 2. No new or progressive disease. 3. Mild prostatomegaly. 4. 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 5. Hepatomegaly."   2) left lower extremity swelling- now resolved  Doppler ultrasound for DVT was negative in the left lower extremity.   Likely from venous compression +/- lymphatic obstruction from bulky left inguinal, left hemipelvic, and  retroperitoneal adenopathy.    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  6) Radiation mucositis  PLAN: -Discussed lab results from today, 02/21/2023, in detail with the patient. CBC shows decreased but improved hemoglobin of 10.0 g/dL and hematocrit of 65.7%. CMP is stable. -improved throat pain and po intake -MBS reviewed -Recommend influenza vaccine, COVID-19 Booster, and other age related vaccines.   -Patient has been tolerating his treatment well without any new or severe toxicities.   -Patient will continue to receive immunotherapy (Pembrolizumab) without any dose modification.  -Advised patient to eat well and stay well hydrated.   FOLLOW-UP: -Continue Keytruda per integrated scheduling with port flush labs and MD visits  The total time spent in the appointment was 30 minutes* .  All of the patient's questions were answered with apparent satisfaction. The patient knows to call the clinic with any problems, questions or concerns.   Wyvonnia Lora MD MS AAHIVMS [facility] Hematology/Oncology Physician [facility]  .*Total Encounter Time as defined by the Centers for Medicare and Medicaid Services includes, in addition to the face-to-face time of a patient visit (documented in the note above) non-face-to-face time: obtaining and reviewing outside history, ordering and reviewing medications, tests or procedures, care coordination (communications with other health care professionals or caregivers) and documentation in the medical record.   I,Param Shah,acting as a Neurosurgeon for Wyvonnia Lora, MD.,have documented all relevant documentation on the behalf of Wyvonnia Lora, MD,as directed by  Wyvonnia Lora, MD while in the presence of Wyvonnia Lora, MD.  .I have reviewed the above documentation for accuracy and completeness, and I agree with the above. Johney Maine MD

## 2023-02-22 ENCOUNTER — Ambulatory Visit: Payer: Medicare HMO | Attending: Radiation Oncology

## 2023-02-22 DIAGNOSIS — R1313 Dysphagia, pharyngeal phase: Secondary | ICD-10-CM | POA: Insufficient documentation

## 2023-02-22 LAB — T4: T4, Total: 7.6 ug/dL (ref 4.5–12.0)

## 2023-02-22 NOTE — Patient Instructions (Addendum)
   With salad/lettuce, try these things: Chew longer Use a creamy dressing instead of a vinegar dressing Take a sip of water after each bite of salad  When drinking: Clear your throat 4-5 times then swallow (because you didn't always cough when something went down your windpipe) ================================== Signs of Aspiration Pneumonia   Chest pain/tightness Fever (can be low grade) Cough  With foul-smelling phlegm (sputum) With sputum containing pus or blood With greenish sputum Fatigue  Shortness of breath  Wheezing   **IF YOU HAVE THESE SIGNS, CONTACT YOUR DOCTOR OR GO TO THE EMERGENCY DEPARTMENT OR URGENT CARE AS SOON AS POSSIBLE**   ]

## 2023-02-22 NOTE — Therapy (Signed)
OUTPATIENT SPEECH LANGUAGE PATHOLOGY ONCOLOGY TREATMENT   Patient Name: Jesus [name] MRN: [id number] DOB:05-04-1956, 67 y.o., male Today's Date: 02/22/2023  PCP: Noberto Retort REFERRING PROVIDER: Lonie Peak, MD  END OF SESSION:  End of Session - 02/22/23 1106     Visit Number 3    Number of Visits 7    Date for SLP Re-Evaluation 03/22/23    SLP Start Time 1105    Activity Tolerance Patient tolerated treatment well             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- last radiation 06-2018   Cataract    removed both eyes with l;ens implants    COPD (chronic obstructive pulmonary disease) (HCC)    Family history of colon cancer    in his brother- dx'd age 39    History of chemotherapy    last 06-01-2018   History of kidney stones    History of radiation therapy    last radiation 06-2018   Hyperlipidemia    currently under control   Hypertension    Irregular heart beats    Lymphadenopathy    Pain, lower leg    Bilateral   Peripheral arterial disease (HCC)    Pre-diabetes    Red-green color blindness    RLS (restless legs syndrome)    Snores    Wears glasses    Past Surgical History:  Procedure Laterality Date   CATARACT EXTRACTION W/ INTRAOCULAR LENS  IMPLANT, BILATERAL     COLONOSCOPY     DIRECT LARYNGOSCOPY Right 02/03/2022   Procedure: DIRECT LARYNGOSCOPY WITH BIOPSY OF RIGHT FALSE VOCAL CORD;  Surgeon: Osborn Coho, MD;  Location: [facility] OR;  Service: ENT;  Laterality: Right;   dislodged salava stone     FRACTURE SURGERY     HAND ARTHROPLASTY  1995   crushed left hand   INGUINAL LYMPH NODE BIOPSY Left 01/02/2018   Procedure: LEFT INGUINAL LYMPH NODE BIOPSY;  Surgeon: Emelia Loron, MD;  Location: [facility] OR;  Service: General;  Laterality: Left;    IR IMAGING GUIDED PORT INSERTION  01/15/2018   IR IMAGING GUIDED PORT INSERTION  08/10/2022   IR REMOVAL TUN ACCESS W/ PORT W/O FL MOD SED  03/11/2019   MEDIASTINOSCOPY N/A 07/22/2022   Procedure: MEDIASTINOSCOPY;  Surgeon: Loreli Slot, MD;  Location: [facility] OR;  Service: Thoracic;  Laterality: N/A;   MICROLARYNGOSCOPY Left 01/17/2014   Procedure: MICROLARYNGOSCOPY WITH EXCISION OF THE BIOPSY OF LEFT VOCAL CORD LESION;  Surgeon: Serena Colonel, MD;  Location: [facility];  Service: ENT;  Laterality: Left;   MICROLARYNGOSCOPY N/A 09/16/2020   Procedure: MICROLARYNGOSCOPY with Biopsy of vocal cord lesion;  Surgeon: Osborn Coho, MD;  Location: [facility] OR;  Service: ENT;  Laterality: N/A;   ORIF FOOT FRACTURE  2005   left   REFRACTIVE SURGERY Right    removed cloudiness in right eye after cataract removal    Patient Active Problem List   Diagnosis Date Noted   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   Pain due to onychomycosis of toenail 10/25/2022   Port-A-Cath in place 09/05/2022   Head and neck cancer (HCC) 08/04/2022   Chronic pain 07/15/2021  Diabetes mellitus without complication (HCC) 07/15/2021   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   Gastroesophageal reflux disease without esophagitis 07/15/2021   History of lymphoma 07/15/2021   Lipoma of skin and subcutaneous tissue of trunk 07/15/2021   Mixed hyperlipidemia 07/15/2021   Prediabetes 07/15/2021   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   Tobacco dependence 07/15/2021   Trigger thumb of left hand 02/17/2021   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   Hoarseness 08/11/2020   Chronic pain of right knee 10/17/2018   Large cell (diffuse) non-Hodgkin's lymphoma (HCC) 05/10/2018   Bacteremia due to Escherichia coli    HTN (hypertension) 04/17/2018   RLS (restless legs syndrome) 04/17/2018   Abnormal LFTs  04/17/2018   Acute metabolic encephalopathy 04/16/2018   AKI (acute kidney injury) (HCC) 04/16/2018   Dehydration    Fever, unspecified    Sepsis (HCC)    Disorientation    Non-Hodgkin's lymphoma of skin (HCC) 04/03/2018   Hypokalemia    Hypomagnesemia    Anemia    Encounter for antineoplastic chemotherapy    At high risk of tumor lysis syndrome    Swelling of lower leg    Diffuse large B cell lymphoma (HCC) 01/15/2018   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HCC) 01/12/2018   Counseling regarding advance care planning and goals of care 01/12/2018   Bilateral leg pain 05/27/2014    ONSET DATE: see "pertinent details"   REFERRING DIAG: 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  "Chicken fried rice, steak, spaghetti, salads with cucumbers and tomatoes. I have trouble with lettuce." (Recent POs) Pt accompanied by: self  PERTINENT HISTORY:  SCC of the Larynx diagnosed in May 2022- now with cervical and left paratracheal nodal metastases. He presented to Dr. Basilio Cairo on 11/11/22 to discuss management of nodal metastases from a laryngeal primary referred by Dr. Candise Che. To review from last visit on 06/01/22 the patient had recently undergone a right neck lymph node biopsy on 05/25/22 which showed findings consistent with metastatic keratinizing squamous cell carcinoma. Biopsy was indicated by new right neck and mediastinal adenopathy which he first noticed in December 2023. (He soon after presented for an MRI of the brain on 06/02/22 which showed no intracranial abnormalities). Based on biopsy results, the patient followed up with Dr. Candise Che who recommended proceeding with a PET scan. Subsequent PET scan on 06/08/22 demonstrated: moderate hypermetabolism associated with the right cervical and  mediastinal metastatic lymphadenopathy; nonspecific mildly prominent activity within the nasopharynx and left pharyngeal tonsil (potentially reactive in etiology); and resolution of the previously demonstrated left pelvic and inguinal adenopathy. PET otherwise showed no evidence of distant metastatic disease and no findings suspicious for recurrent lymphoma. 07/05/22 He saw Dr. Ernestene Kiel at [facility] to discuss the biopsy findings. During that visit the patient endorsed increasing hoarseness over the past couple of weeks and intermittent bilateral ear pain. Dr. Ernestene Kiel recommended discussing at ENT TB for optimal treatment course. He was subsequently referred to Dr. Dorris Fetch on 07/14/22 who recommended proceeding with a mediastinoscopy for additional nodal biopsies/excisions of the left paratracheal adenopathy to confirm his diagnosis. Biopsies of the left paratracheal mass collected on 07/22/22 showed fibrous tissue involved by squamous cell carcinoma; nodal status of 3/3 lymph nodes negative for metastatic carcinoma. Dr. Dorris Fetch did  not recommend resection given that it was not possible to remove the visible disease.  Dr. Candise Che subsequently recommended proceeding with systemic treatment consisting of carbo/5FU/Pembrolizumab based on PdL1 status, followed by palliative radiation therapy to any residual disease s/p systemic treatment. He received his first dose on 08/15/22. Chemo toxicities reported by the patient throughout the course of systemic treatment included mild fatigue, nausea (improved with steroids) occasional bilateral hand and leg neuropathy due to Pembrolizumab, and swallowing problems. He received his 4th cycle of chemotherapy on 11/08/22. 11/07/22 PET demonstrated progressive enlargement and hypermetabolic activity within the previously demonstrated right level II cervical and left paratracheal lymph nodes consistent with progressive metastatic disease. PET otherwise showed no evidence of  metastatic disease in the abdomen or pelvis, and no evidence of osseous metastatic disease.Treatment plan:  He will receive 30 fractions of radiation to his right neck and mediastinum.  Radiation started 12/01/22 and will complete 01/13/23  PAIN:  Are you having pain? No  FALLS: Has patient fallen in last 6 months?  No   PATIENT GOALS: Pt indicated he would like to consistently swallow normally  OBJECTIVE:    TODAY'S TREATMENT:                                                                                                                                         DATE:   02/22/23: SLP educated pt about overt s/sx aspiration PNA. Pt has been having a wide variety of foods without reported difficulty. SLP educated pt about overt s/sx aspiration PNA, and he told SLP rationale for HEP with independence. Pt and SLP talked about how pt could modify eating salads to foster better pharyngeal clearance of lettuce (see pt instructions). Pt ate peanut butter crackers today and drank H2O without overt s/sx pharyngeal deficits, nor oral deficits.  HEP completed with independence, he has been completing 20 reps/day/exercise; SLP encouraged pt to complete no less than 20 reps/exercise/day. Pt agreed he could be seen in 2 months due to progress.   01/25/23:Pt dx/d with COVID 01/17/23 with cough x2 days, so recently hasn't felt like doing exercises. Pt performed exercises today with min A for Masako (tongue protrusion). MBS was re-scheduled to 01/31/23. Pt ate peanut butter crackers and drank water without overt s/sx oral or pharyngeal difficulty, including aspiration. Taking smaller bites.  When pt performed Masako he coughed while performing exercise due to taking a DRINK of water (pt was instructed to take a DROP of water). SLP reiterated a DROP of water and not a sip. Pt then performed Masako correctly using this strategy successfully. SLP reiterated at least 20 reps/day with no less than 5 reps at once. Before SLP  was done saying this pt stated "...and don't do less than 5. I was doing more than 5 - I got more than 20 every day." SLP congratulated pt on this. SLP told pt to use two-three sips  of water prior to taking meds to aid in pharyngeal transit.   12/22/22 (eval): Research states the risk for dysphagia increases due to radiation and/or chemotherapy treatment due to a variety of factors, so SLP educated the pt about the possibility of reduced/limited ability for PO intake during rad tx. SLP also educated pt regarding possible changes to swallowing musculature after rad tx, and why adherence to dysphagia HEP provided today and PO consumption was necessary to inhibit muscle fibrosis following rad tx and to mitigate muscle disuse atrophy. SLP informed pt why this would be detrimental to their swallowing status and to their pulmonary health. Pt demonstrated understanding of these things to SLP. SLP encouraged pt to safely eat and drink as deep into their radiation/chemotherapy as possible to provide the best possible long-term swallowing outcome for pt.    SLP then developed an individualized HEP for pt involving oral and pharyngeal strengthening and ROM and pt was instructed how to perform these exercises, including SLP demonstration. After SLP demonstration, pt return demonstrated each exercise. SLP ensured pt performance was correct prior to educating pt on next exercise. Pt required occasional min-mod cues faded to modified independent to perform HEP. Pt was instructed to complete this program 6-7 days/week, at last 20 reps/day without any less than 5 reps at once, until 6 months after his last day of rad tx, and then x2 a week after that, indefinitely.    PATIENT EDUCATION: Education details: late effects head/neck radiation on swallow function, HEP procedure, and modification to HEP when difficulty experienced with swallowing during and after radiation course Person educated: Patient Education method:  Explanation, Demonstration, Verbal cues, and Handouts Education comprehension: verbalized understanding, returned demonstration, verbal cues required, and needs further education   ASSESSMENT:  CLINICAL IMPRESSION: Patient is a 67 y.o. M who was seen today for treatment of swallowing following radiation therapy ending on 01/13/23. He has been eating food items from regular diet. Today when pt ate items from regular diet and drank thin liquids, no s/sx of oral difficulties noted, At this time pt swallowing is deemed WNL/WFL with this diet and thin liquids. No overt s/sx aspiration were observed. There are no overt s/s aspiration PNA observed by SLP nor any reported by pt at this time. Data indicate that pt's swallow ability will likely decrease over the course of radiation/chemoradiation therapy and could very well decline over time following the conclusion of that therapy due to muscle disuse atrophy and/or muscle fibrosis. Pt will cont to need to be seen by SLP in order to assess safety of PO intake, assess the need for recommending any objective swallow assessment, and ensuring pt is correctly completing the individualized HEP.  OBJECTIVE IMPAIRMENTS: include dysphagia. These impairments are limiting patient from safety when swallowing. Factors affecting potential to achieve goals and functional outcome are  none noted . Patient will benefit from skilled SLP services to address above impairments and improve overall function.  REHAB POTENTIAL: Good   GOALS: Goals reviewed with patient? No  SHORT TERM GOALS: Target: 3rd total session        1.   Pt will complete HEP with modified independence in 2 sessions Baseline: 02/22/23 Goal status: Partially met   2.  pt will tell SLP why pt is completing HEP with modified independence Baseline:  Goal status: Met   3.  pt will describe 3 overt s/s aspiration PNA with modified independence Baseline:  Goal status: Met   4.  pt will tell SLP how a  food journal could hasten return to a more normalized diet Baseline:  Goal status: Deferred -returned to WNL diet     LONG TERM GOALS: Target: 7th total session   1.  pt will complete HEP with independence over two visits Baseline:  Goal status: Initial   2.  pt will describe how to modify HEP over time, and the timeline associated with reduction in HEP frequency with modified independence over two sessions Baseline:  Goal status: Initial  3.  Pt will follow precautions from possible MBS in 2 sessions Baseline:  Goal status: Initial PLAN:  SLP FREQUENCY:  once every approx 8 weeks  SLP DURATION:  7 sessions  PLANNED INTERVENTIONS: Aspiration precaution training, Pharyngeal strengthening exercises, Diet toleration management , Trials of upgraded texture/liquids, SLP instruction and feedback, Compensatory strategies, and Patient/family education    [facility], CCC-SLP 02/22/2023, 11:13 AM

## 2023-02-24 ENCOUNTER — Other Ambulatory Visit: Payer: Self-pay

## 2023-02-25 ENCOUNTER — Other Ambulatory Visit: Payer: Self-pay

## 2023-02-27 ENCOUNTER — Encounter: Payer: Self-pay | Admitting: Hematology

## 2023-03-06 ENCOUNTER — Ambulatory Visit (INDEPENDENT_AMBULATORY_CARE_PROVIDER_SITE_OTHER): Payer: Medicare HMO | Admitting: Podiatry

## 2023-03-06 DIAGNOSIS — B351 Tinea unguium: Secondary | ICD-10-CM

## 2023-03-06 DIAGNOSIS — R601 Generalized edema: Secondary | ICD-10-CM

## 2023-03-06 DIAGNOSIS — I739 Peripheral vascular disease, unspecified: Secondary | ICD-10-CM | POA: Diagnosis not present

## 2023-03-06 DIAGNOSIS — M79676 Pain in unspecified toe(s): Secondary | ICD-10-CM

## 2023-03-06 NOTE — Progress Notes (Signed)
 Subjective:  Patient ID: Jesus [name], male    DOB: Jan 17, 1956,  MRN: [id number]  Jesus [name] presents to clinic today for: Patient notes nails are thick and elongated, causing pain in shoe gear when ambulating.  Notes the great toenails feel a little ingrown along the distal corners only.  There was a note that his oncologist wants him sent for vascular studies.    PCP is Noberto Retort, MD.  Last seen 02/02/23.  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- last radiation 06-2018   Cataract    removed both eyes with l;ens implants    COPD (chronic obstructive pulmonary disease) (HCC)    Family history of colon cancer    in his brother- dx'd age 47    History of chemotherapy    last 06-01-2018   History of kidney stones    History of radiation therapy    last radiation 06-2018   Hyperlipidemia    currently under control   Hypertension    Irregular heart beats    Lymphadenopathy    Pain, lower leg    Bilateral   Peripheral arterial disease (HCC)    Pre-diabetes    Red-green color blindness    RLS (restless legs syndrome)    Snores    Wears glasses     Allergies  Allergen Reactions   Bee Venom Anaphylaxis   Lisinopril Other (See Comments)    Swelling in face   Antifungal [Miconazole Nitrate] Other (See Comments)     made his head hurt   Zolpidem Tartrate Er Other (See Comments)    Leg cramps   Objective:  Jesus [name] is a pleasant 67 y.o. male in NAD. AAO x 3.  Vascular Examination: Patient has trace palpable DP pulse, absent PT pulse bilateral.  Delayed capillary refill bilateral toes.  Sparse digital hair bilateral.  Proximal to distal cooling WNL bilateral.  +1 pitting edema bilateral legs and ankles.  Dermatological Examination: Interspaces are clear with  no open lesions noted bilateral.  Skin is shiny and atrophic bilateral.  Nails are 3-67mm thick, with yellowish/brown discoloration, subungual debris and distal onycholysis x10.  There is pain with compression of nails x10.  Patient qualifies for at-risk foot care because of PVD .  Assessment/Plan: 1. Pain due to onychomycosis of toenail   2. Generalized edema   3. PVD (peripheral vascular disease) (HCC)     VAS [place] ABI WITH/WO TBI VAS [place] LOWER EXTREMITY VENOUS REFLUX  Mycotic nails x10 were sharply debrided with sterile nail nippers and power debriding burr to decrease bulk and length.  Hallux nail corners cut back to alleviate discomfort.     Order written for ABI's and venous ultrasound.  There seems to be a component of both issues in the lower extremities.  Return in about 3 months (around 06/06/2023) for RFC.   Clerance Lav, DPM, FACFAS [facility]     [address]                                        [place],  [place] [address]                Office [phone]  Fax [phone]

## 2023-03-07 ENCOUNTER — Other Ambulatory Visit: Payer: Self-pay

## 2023-03-07 ENCOUNTER — Encounter: Payer: Self-pay | Admitting: Hematology

## 2023-03-13 ENCOUNTER — Inpatient Hospital Stay: Payer: Medicare HMO | Attending: Hematology

## 2023-03-13 ENCOUNTER — Inpatient Hospital Stay: Payer: Medicare HMO

## 2023-03-13 ENCOUNTER — Inpatient Hospital Stay (HOSPITAL_BASED_OUTPATIENT_CLINIC_OR_DEPARTMENT_OTHER): Payer: Medicare HMO | Admitting: Hematology

## 2023-03-13 VITALS — BP 127/76 | HR 83 | Temp 98.1°F | Resp 20 | Wt 276.4 lb

## 2023-03-13 DIAGNOSIS — Z87891 Personal history of nicotine dependence: Secondary | ICD-10-CM | POA: Diagnosis not present

## 2023-03-13 DIAGNOSIS — Z5112 Encounter for antineoplastic immunotherapy: Secondary | ICD-10-CM | POA: Insufficient documentation

## 2023-03-13 DIAGNOSIS — C320 Malignant neoplasm of glottis: Secondary | ICD-10-CM | POA: Diagnosis not present

## 2023-03-13 DIAGNOSIS — C760 Malignant neoplasm of head, face and neck: Secondary | ICD-10-CM | POA: Diagnosis not present

## 2023-03-13 DIAGNOSIS — Z7189 Other specified counseling: Secondary | ICD-10-CM | POA: Diagnosis not present

## 2023-03-13 DIAGNOSIS — Z79899 Other long term (current) drug therapy: Secondary | ICD-10-CM | POA: Insufficient documentation

## 2023-03-13 DIAGNOSIS — Z95828 Presence of other vascular implants and grafts: Secondary | ICD-10-CM

## 2023-03-13 LAB — CMP (CANCER CENTER ONLY)
ALT: 8 U/L (ref 0–44)
AST: 11 U/L — ABNORMAL LOW (ref 15–41)
Albumin: 4.2 g/dL (ref 3.5–5.0)
Alkaline Phosphatase: 88 U/L (ref 38–126)
Anion gap: 4 — ABNORMAL LOW (ref 5–15)
BUN: 19 mg/dL (ref 8–23)
CO2: 28 mmol/L (ref 22–32)
Calcium: 9.5 mg/dL (ref 8.9–10.3)
Chloride: 104 mmol/L (ref 98–111)
Creatinine: 1.06 mg/dL (ref 0.61–1.24)
GFR, Estimated: >60 mL/min (ref >60)
Glucose, Bld: 167 mg/dL — ABNORMAL HIGH (ref 70–99)
Potassium: 4.3 mmol/L (ref 3.5–5.1)
Sodium: 136 mmol/L (ref 135–145)
Total Bilirubin: 0.3 mg/dL (ref <1.2)
Total Protein: 7.5 g/dL (ref 6.5–8.1)

## 2023-03-13 LAB — CBC WITH DIFFERENTIAL (CANCER CENTER ONLY)
Abs Immature Granulocytes: 0.01 10*3/uL (ref 0.00–0.07)
Basophils Absolute: 0 10*3/uL (ref 0.0–0.1)
Basophils Relative: 1 %
Eosinophils Absolute: 0.2 10*3/uL (ref 0.0–0.5)
Eosinophils Relative: 4 %
HCT: 31.3 % — ABNORMAL LOW (ref 39.0–52.0)
Hemoglobin: 10.5 g/dL — ABNORMAL LOW (ref 13.0–17.0)
Immature Granulocytes: 0 %
Lymphocytes Relative: 28 %
Lymphs Abs: 1.4 10*3/uL (ref 0.7–4.0)
MCH: 30.1 pg (ref 26.0–34.0)
MCHC: 33.5 g/dL (ref 30.0–36.0)
MCV: 89.7 fL (ref 80.0–100.0)
Monocytes Absolute: 0.5 10*3/uL (ref 0.1–1.0)
Monocytes Relative: 10 %
Neutro Abs: 2.9 10*3/uL (ref 1.7–7.7)
Neutrophils Relative %: 57 %
Platelet Count: 327 10*3/uL (ref 150–400)
RBC: 3.49 MIL/uL — ABNORMAL LOW (ref 4.22–5.81)
RDW: 14.5 % (ref 11.5–15.5)
WBC Count: 5 10*3/uL (ref 4.0–10.5)
nRBC: 0 % (ref 0.0–0.2)

## 2023-03-13 LAB — TSH: TSH: 2.753 u[IU]/mL (ref 0.350–4.500)

## 2023-03-13 MED ORDER — SODIUM CHLORIDE 0.9% FLUSH
10.0000 mL | Freq: Once | INTRAVENOUS | Status: AC
  Administered 2023-03-13: 10 mL

## 2023-03-13 MED ORDER — SODIUM CHLORIDE 0.9 % IV SOLN
Freq: Once | INTRAVENOUS | Status: AC
Start: 2023-03-13 — End: 2023-03-13

## 2023-03-13 MED ORDER — SODIUM CHLORIDE 0.9 % IV SOLN
200.0000 mg | Freq: Once | INTRAVENOUS | Status: AC
  Administered 2023-03-13: 200 mg via INTRAVENOUS
  Filled 2023-03-13: qty 200

## 2023-03-13 NOTE — Progress Notes (Signed)
Patient seen by Dr. Kale  Vitals are within treatment parameters.  Labs reviewed: and are within treatment parameters.  Per physician team, patient is ready for treatment and there are NO modifications to the treatment plan.  

## 2023-03-13 NOTE — Patient Instructions (Signed)
[facility] - A DEPT OF [facility]  Discharge Instructions: Thank you for choosing [facility] to provide your oncology and hematology care.   If you have a lab appointment with the Cancer Center, please go directly to the Cancer Center and check in at the registration area.   Wear comfortable clothing and clothing appropriate for easy access to any Portacath or PICC line.   We strive to give you quality time with your provider. You may need to reschedule your appointment if you arrive late (15 or more minutes).  Arriving late affects you and other patients whose appointments are after yours.  Also, if you miss three or more appointments without notifying the office, you may be dismissed from the clinic at the provider's discretion.      For prescription refill requests, have your pharmacy contact our office and allow 72 hours for refills to be completed.    Today you received the following chemotherapy and/or immunotherapy agents Jesus [name]      To help prevent nausea and vomiting after your treatment, we encourage you to take your nausea medication as directed.  BELOW ARE SYMPTOMS THAT SHOULD BE REPORTED IMMEDIATELY: *FEVER GREATER THAN 100.4 F (38 C) OR HIGHER *CHILLS OR SWEATING *NAUSEA AND VOMITING THAT IS NOT CONTROLLED WITH YOUR NAUSEA MEDICATION *UNUSUAL SHORTNESS OF BREATH *UNUSUAL BRUISING OR BLEEDING *URINARY PROBLEMS (pain or burning when urinating, or frequent urination) *BOWEL PROBLEMS (unusual diarrhea, constipation, pain near the anus) TENDERNESS IN MOUTH AND THROAT WITH OR WITHOUT PRESENCE OF ULCERS (sore throat, sores in mouth, or a toothache) UNUSUAL RASH, SWELLING OR PAIN  UNUSUAL VAGINAL DISCHARGE OR ITCHING   Items with * indicate a potential emergency and should be followed up as soon as possible or go to the Emergency Department if any problems should occur.  Please show the CHEMOTHERAPY ALERT CARD or IMMUNOTHERAPY  ALERT CARD at check-in to the Emergency Department and triage nurse.  Should you have questions after your visit or need to cancel or reschedule your appointment, please contact [facility] - A DEPT OF [facility]  Dept: [phone]  and follow the prompts.  Office hours are 8:00 a.m. to 4:30 p.m. Monday - Friday. Please note that voicemails left after 4:00 p.m. may not be returned until the following business day.  We are closed weekends and major holidays. You have access to a nurse at all times for urgent questions. Please call the main number to the clinic Dept: [phone] and follow the prompts.   For any non-urgent questions, you may also contact your provider using MyChart. We now offer e-Visits for anyone 42 and older to request care online for non-urgent symptoms. For details visit [email or web address].   Also download the MyChart app! Go to the app store, search "MyChart", open the app, select [place], and log in with your MyChart username and password.

## 2023-03-13 NOTE — Progress Notes (Signed)
[facility]   Telephone:([phone] Fax:([phone]   CLINIC Notes  Date of Service:  03/13/23  Patient Care Team: Noberto Retort, MD as PCP - General (Family Medicine) Wendall Stade, MD as PCP - Cardiology (Cardiology) Lonie Peak, MD as Consulting Physician (Radiation Oncology) Malmfelt, Lise Auer, RN as Oncology Nurse Navigator Osborn Coho, MD (Inactive) as Consulting Physician (Otolaryngology) Johney Maine, MD as Attending Physician (Hematology)    CHIEF COMPLAINTS/PURPOSE OF CONSULTATION:  F/u for continued evaluation and 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  HISTORY OF PRESENTING ILLNESS:   Jesus [name] 67 y.o. male is here because of left lower extremity edema and lymphadenopathy.  The patient was seen in the emergency room this past Friday for the same issue.  A CT of the abdomen and pelvis was performed showing bulky left inguinal, left hemipelvic, and retroperitoneal adenopathy.  He was referred to [place] from the emergency room for further evaluation.  Doppler ultrasound of the left lower extremity was performed and was negative for DVT. Patient reports that he has been having left lower extremity edema in his left groin and left leg for approximately 1 month.  He states that the swelling in the left groin started to get better but then worsened.  The left lower extremity edema has slowly worsened over time.  Patient denies having fevers and chills.  He reports that he does have night sweats at times.  He reported having headaches approximate 1 [place]  He thinks his headaches are related to not having his blood pressure medication.  His headaches have now resolved.  He denies visual changes.  The patient denies chest pain, shortness of breath and cough.  No nausea, vomiting, constipation, diarrhea.  Denies abdominal pain.  The patient denies recent weight loss and has actually gained weight  recently.  Patient denies epistaxis, bleeding gums, hemoptysis, hematuria, but occasionally, and melena.  He reports increased urinary frequency over the past month but no dysuria.  The patient is here for evaluation and discussion of his recent CT and lab findings.  Interval History:  Jesus [name] 67 y.o. male who is here for continued evaluation and management of newly noted metastatic laryngeal squamous cell carcinoma. He is here to start cycle 11 day 1 of Pembrolizumab.  Patient was last seen by me on 02/21/2023 and he complains of occasional soreness near his port-a-cath site and chest area, but was doing well overall.   Patient notes he has been doing well overall without any new or severe medical concerns since our last visit. He notes his throat pain have improved and he has been eating well now. He does report throat pain when eating lettuce.   He denies any new infection issues, fever, chills, night sweats, unexpected weight loss, back pain, chest pain, or leg swelling.   He has been tolerating his Keytruda treatment well without any new or severe toxicities.   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- last radiation 06-2018   Cataract    removed both eyes with l;ens implants    COPD (chronic obstructive pulmonary disease) (HCC)    Family history of colon  cancer    in his brother- dx'd age 44    History of chemotherapy    last 06-01-2018   History of kidney stones    History of radiation therapy    last radiation 06-2018   Hyperlipidemia    currently under control   Hypertension    Irregular heart beats    Lymphadenopathy    Pain, lower leg    Bilateral   Peripheral arterial disease (HCC)    Pre-diabetes    Red-green color blindness    RLS (restless legs  syndrome)    Snores    Wears glasses     SURGICAL HISTORY: Past Surgical History:  Procedure Laterality Date   CATARACT EXTRACTION W/ INTRAOCULAR LENS  IMPLANT, BILATERAL     COLONOSCOPY     DIRECT LARYNGOSCOPY Right 02/03/2022   Procedure: DIRECT LARYNGOSCOPY WITH BIOPSY OF RIGHT FALSE VOCAL CORD;  Surgeon: Osborn Coho, MD;  Location: [facility] OR;  Service: ENT;  Laterality: Right;   dislodged salava stone     FRACTURE SURGERY     HAND ARTHROPLASTY  1995   crushed left hand   INGUINAL LYMPH NODE BIOPSY Left 01/02/2018   Procedure: LEFT INGUINAL LYMPH NODE BIOPSY;  Surgeon: Emelia Loron, MD;  Location: [facility] OR;  Service: General;  Laterality: Left;   IR IMAGING GUIDED PORT INSERTION  01/15/2018   IR IMAGING GUIDED PORT INSERTION  08/10/2022   IR REMOVAL TUN ACCESS W/ PORT W/O FL MOD SED  03/11/2019   MEDIASTINOSCOPY N/A 07/22/2022   Procedure: MEDIASTINOSCOPY;  Surgeon: Loreli Slot, MD;  Location: [facility] OR;  Service: Thoracic;  Laterality: N/A;   MICROLARYNGOSCOPY Left 01/17/2014   Procedure: MICROLARYNGOSCOPY WITH EXCISION OF THE BIOPSY OF LEFT VOCAL CORD LESION;  Surgeon: Serena Colonel, MD;  Location: [facility];  Service: ENT;  Laterality: Left;   MICROLARYNGOSCOPY N/A 09/16/2020   Procedure: MICROLARYNGOSCOPY with Biopsy of vocal cord lesion;  Surgeon: Osborn Coho, MD;  Location: [facility];  Service: ENT;  Laterality: N/A;   ORIF FOOT FRACTURE  2005   left   REFRACTIVE SURGERY Right    removed cloudiness in right eye after cataract removal     SOCIAL HISTORY: Social History   Socioeconomic History   Marital status: Divorced    Spouse name: Not on file   Number of children: 3   Years of education: Not on file   Highest education level: Not on file  Occupational History   Not on file  Tobacco Use   Smoking status: Former    Current packs/day: 0.00    Average packs/day: 0.5 packs/day for 36.0 years (18.0 ttl pk-yrs)    Types: Cigarettes    Start date:  09/28/1984    Quit date: 09/28/2020    Years since quitting: 2.4   Smokeless tobacco: Never   Tobacco comments:    he denies smoking in about 2 weeks 07/13/18  Vaping Use   Vaping status: Never Used  Substance and Sexual Activity   Alcohol use: Yes    Alcohol/week: 6.0 standard drinks of alcohol    Types: 6 Cans of beer per week    Comment: Last use: Friday 30th   Drug use: Yes    Types: Marijuana, Cocaine    Comment: Last [facility] Use: Friday 30th.   Sexual activity: Not Currently  Other Topics Concern   Not on file  Social History Narrative   Not on file   Social Determinants of Health   Financial Resource Strain:  Low Risk  (10/06/2020)   Overall Financial Resource Strain (CARDIA)    Difficulty of Paying Living Expenses: Not very hard  Food Insecurity: No Food Insecurity (11/09/2022)   Hunger Vital Sign    Worried About Running Out of Food in the Last Year: Never true    Ran Out of Food in the Last Year: Never true  Transportation Needs: No Transportation Needs (11/09/2022)   PRAPARE - Administrator, Civil Service (Medical): No    Lack of Transportation (Non-Medical): No  Physical Activity: Not on file  Stress: Not on file  Social Connections: Moderately Integrated (10/06/2020)   Social Connection and Isolation Panel [NHANES]    Frequency of Communication with Friends and Family: Three times a week    Frequency of Social Gatherings with Friends and Family: Three times a week    Attends Religious Services: 1 to 4 times per year    Active Member of Clubs or Organizations: Yes    Attends Banker Meetings: 1 to 4 times per year    Marital Status: Divorced  Intimate Partner Violence: Not At Risk (11/09/2022)   Humiliation, Afraid, Rape, and Kick questionnaire    Fear of Current or Ex-Partner: No    Emotionally Abused: No    Physically Abused: No    Sexually Abused: No    FAMILY HISTORY: Family History  Problem Relation Age of Onset   Breast cancer Mother     Diabetes Father    Hypertension Father    Stroke Father    Mental illness Sister    Hypertension Daughter    Mental illness Daughter    Hypertension Brother    Colon cancer Brother 26       passed away 2018/12/17   Breast cancer Sister    Esophageal cancer Neg Hx    Colon polyps Neg Hx    Rectal cancer Neg Hx    Stomach cancer Neg Hx     ALLERGIES:  is allergic to bee venom, lisinopril, antifungal [miconazole nitrate], and zolpidem tartrate er.  MEDICATIONS:  Current Outpatient Medications  Medication Sig Dispense Refill   ACCU-CHEK GUIDE test strip 1 each by Other route as needed for other.     Accu-Chek Softclix Lancets lancets      acetaminophen (TYLENOL) 500 MG tablet Take 1,500 mg by mouth daily as needed for moderate pain.     amLODipine (NORVASC) 10 MG tablet TAKE 1 TABLET BY MOUTH EVERY DAY (Patient not taking: Reported on 01/11/2023) 30 tablet 0   aspirin EC 81 MG tablet Take 81 mg by mouth every evening. Swallow whole.     b complex vitamins capsule Take 1 capsule by mouth daily.     HYDROcodone-acetaminophen (HYCET) 7.5-325 mg/15 ml solution Take 10-15 mLs by mouth 4 (four) times daily as needed for moderate pain. Take with food. 300 mL 0   lidocaine (XYLOCAINE) 2 % solution Patient: Mix 1part 2% viscous lidocaine, 1part H20. Swallow 10mL of diluted mixture, before meals and at bedtime, up to QID to help sore throat 200 mL 3   lidocaine-prilocaine (EMLA) cream Apply to affected area once 30 g 3   lisinopril (ZESTRIL) 20 MG tablet TAKE 1 TABLET BY MOUTH EVERY DAY (Patient taking differently: Take 20 mg by mouth every evening.) 30 tablet 0   magic mouthwash w/lidocaine SOLN Take 5 mLs by mouth 4 (four) times daily as needed for mouth pain. Compound 400 mL with the following 80 ML of distilled water  80 mL of Maalox 80 mL of nystatin 500,000 units per 5 mL 80 mL of 2% viscous lidocaine 80 mL of Benadryl 12.5mg  per 5 mL 400 mL 0   metFORMIN (GLUCOPHAGE) 500 MG tablet  Take 500 mg by mouth 2 (two) times daily with a meal.     Multiple Vitamins-Minerals (MULTI ADULT GUMMIES PO) Take 1 tablet by mouth in the morning. Centrum     ondansetron (ZOFRAN) 8 MG tablet Take 1 tablet (8 mg total) by mouth every 8 (eight) hours as needed for nausea or vomiting. Start on the third day after carboplatin. 30 tablet 1   oxyCODONE (OXY IR/ROXICODONE) 5 MG immediate release tablet Take 1 tablet (5 mg total) by mouth every 6 (six) hours as needed for moderate pain or breakthrough pain. 20 tablet 0   prochlorperazine (COMPAZINE) 10 MG tablet Take 1 tablet (10 mg total) by mouth every 6 (six) hours as needed for nausea or vomiting. 30 tablet 1   sucralfate (CARAFATE) 1 g tablet Take 1 tablet (1 g total) by mouth 2 (two) times daily. 60 tablet 0   tadalafil (CIALIS) 20 MG tablet Take 20 mg by mouth daily as needed for erectile dysfunction.     No current facility-administered medications for this visit.    REVIEW OF SYSTEMS:   10 Point review of Systems was done is negative except as noted above.  PHYSICAL EXAMINATION: .There were no vitals taken for this visit. Marland Kitchen GENERAL:alert, in no acute distress and comfortable SKIN: no acute rashes, no significant lesions EYES: conjunctiva are pink and non-injected, sclera anicteric OROPHARYNX: MMM, no exudates, no oropharyngeal erythema or ulceration NECK: supple, no JVD LYMPH:  no palpable lymphadenopathy in the cervical, axillary or inguinal regions LUNGS: clear to auscultation b/l with normal respiratory effort HEART: regular rate & rhythm ABDOMEN:  normoactive bowel sounds , non tender, not distended. Extremity: no pedal edema PSYCH: alert & oriented x 3 with fluent speech NEURO: no focal motor/sensory deficits   LABORATORY DATA:   I have reviewed the data as listed    Latest Ref Rng & Units 03/13/2023    1:24 PM 02/21/2023   12:42 PM 01/30/2023   11:56 AM  CBC  WBC 4.0 - 10.5 K/uL 5.0  5.3  4.0   Hemoglobin 13.0 - 17.0  g/dL 64.4  03.4  8.9   Hematocrit 39.0 - 52.0 % 31.3  31.3  28.0   Platelets 150 - 400 K/uL 327  332  404    CBC    Component Value Date/Time   WBC 5.0 03/13/2023 1324   WBC 6.5 07/19/2022 1400   RBC 3.49 (L) 03/13/2023 1324   HGB 10.5 (L) 03/13/2023 1324   HCT 31.3 (L) 03/13/2023 1324   PLT 327 03/13/2023 1324   MCV 89.7 03/13/2023 1324   MCV 87.3 12/22/2017 1518   MCH 30.1 03/13/2023 1324   MCHC 33.5 03/13/2023 1324   RDW 14.5 03/13/2023 1324   LYMPHSABS 1.4 03/13/2023 1324   MONOABS 0.5 03/13/2023 1324   EOSABS 0.2 03/13/2023 1324   BASOSABS 0.0 03/13/2023 1324       Latest Ref Rng & Units 03/13/2023    1:24 PM 02/21/2023   12:42 PM 01/30/2023   11:56 AM  CMP  Glucose 70 - 99 mg/dL 742  595  638   BUN 8 - 23 mg/dL 19  16  26    Creatinine 0.61 - 1.24 mg/dL 7.56  4.33  2.95   Sodium 135 - 145  mmol/L 136  137  138   Potassium 3.5 - 5.1 mmol/L 4.3  4.3  4.4   Chloride 98 - 111 mmol/L 104  102  103   CO2 22 - 32 mmol/L 28  28  29    Calcium 8.9 - 10.3 mg/dL 9.5  9.7  62.1   Total Protein 6.5 - 8.1 g/dL 7.5  7.6  7.8   Total Bilirubin <1.2 mg/dL 0.3  0.4  0.4   Alkaline Phos 38 - 126 U/L 88  79  74   AST 15 - 41 U/L 11  10  14    ALT 0 - 44 U/L 8  7  12     . Lab Results  Component Value Date   LDH 168 05/18/2022   Tsh 1.42 (05/21/2021)   01/02/18 Left Inguinal LN Bx:   12/26/17 Tissue Flow Cytometry:   12/26/17 Inguinal Core biopsy:      RADIOGRAPHIC STUDIES: I have personally reviewed the radiological images as listed and agreed with the findings in the report. No results found.  ASSESSMENT & PLAN:   This is a pleasant 67 y.o. 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   CT of the abdomen and  pelvis performed on 12/22/2017 showed bulky left inguinal, left hemipelvic, and  retroperitoneal adenopathy.     01/02/18 Left inguinal LN Biopsy revealed T-Cell/histocyte rich Large B-Cell Lymphoma   12/27/17 ECHO revealed LV EF of 55-60%    01/05/18 PET/CT revealed Massively enlarged pelvic lymph nodes intense metabolic activity consistent lymphoma. 2. Additional hypermetabolic lymph nodes in the porta hepatis and retroperitoneum LEFT aorta. 3. Solitary hypermetabolic mediastinal lymph node in the upper LEFT Mediastinum. 4. 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 5. Normal spleen.     HIV non reactive on 12/22/2017.  Hep C and hep B serology negative.   03/14/18 PET/CT revealed PET-CT findings suggest an excellent response to chemotherapy. The abdominal lymphadenopathy has near completely resolved and demonstrates a near complete metabolic response. The pelvic and inguinal adenopathy has significantly decreased in size and the metabolic activity has significantly decreased. 2. Diffuse marrow activity likely due to chemotherapy and or marrow stimulating drugs. I do not see any discrete persistent lesions.    04/17/18 CT Head revealed Subtle mesial caudothalamic hypodensities may be artifact though, the could reflect encephalitis or Wernicke's encephalopathy. Consider MRI of the head with and without contrast. 2. Mild chronic small vessel ischemic changes    S/p 6 cycles of EPOCH-R completed on 06/01/18  06/28/18 PET/CT revealed Continued good response to treatment. No residual measurable or hypermetabolic abdominal lymphadenopathy and no recurrent osseous disease. 2. Interval decrease in size of the left operator region lymph node and also the left inguinal lymph node. However, the both have small foci of slightly increased hypermetabolism which bears surveillance. 3. No new or progressive lymphadenopathy in the neck, chest, abdomen or pelvis.  S/p 39.6 Gy  in 22 fractions between 07/25/18 and 08/23/18  02/13/2019 CT C/A/P ([id number]) ([id number]) revealed "1. Response to therapy of pelvic adenopathy compared to the PET of 06/28/2018. 2. No new or progressive disease. 3. Mild prostatomegaly. 4. 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 5. Hepatomegaly."   2) left lower extremity swelling- now resolved  Doppler ultrasound for  DVT was negative in the left lower extremity.   Likely from venous compression +/- lymphatic obstruction from bulky left inguinal, left hemipelvic, and  retroperitoneal adenopathy.    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  6) Radiation mucositis  PLAN: -Discussed lab results from today, 03/13/2023, in detail with the patient. CBC shows patient slightly low hemoglobin of 10.5 g/dL with hematocrit of 16.1%. CMP shows slightly decreased AST of 11, but stable overall. -improved throat pain and po intake -Recommend influenza vaccine, COVID-19 Booster, and other age related vaccines.   -Patient has been tolerating his treatment well without any new or severe toxicities.  -Patient will continue to receive immunotherapy (Pembrolizumab) without any dose modification.  -Advised patient to eat well and stay well hydrated.  -PET/CT expected around 04/12/2023 for post RT response assessment FOLLOW-UP: Per integrated scheduling  The total time spent in the appointment was 30 minutes* .  All of the patient's questions were answered with apparent satisfaction. The patient knows to call the clinic with any problems,  questions or concerns.   Wyvonnia Lora MD MS AAHIVMS [facility] Hematology/Oncology Physician [facility]  .*Total Encounter Time as defined by the Centers for Medicare and Medicaid Services includes, in addition to the face-to-face time of a patient visit (documented in the note above) non-face-to-face time: obtaining and reviewing outside history, ordering and reviewing medications, tests or procedures, care coordination (communications with other health care professionals or caregivers) and documentation in the medical record.   I,Param Shah,acting as a Neurosurgeon for Wyvonnia Lora, MD.,have documented all relevant documentation on the behalf of Wyvonnia Lora, MD,as directed by  Wyvonnia Lora, MD while in the presence of Wyvonnia Lora, MD.   .I have reviewed the above documentation for accuracy and completeness, and I agree with the above. Johney Maine MD

## 2023-03-14 LAB — T4: T4, Total: 7.4 ug/dL (ref 4.5–12.0)

## 2023-03-19 ENCOUNTER — Encounter: Payer: Self-pay | Admitting: Hematology

## 2023-04-03 ENCOUNTER — Inpatient Hospital Stay: Payer: Medicare HMO

## 2023-04-03 ENCOUNTER — Inpatient Hospital Stay (HOSPITAL_BASED_OUTPATIENT_CLINIC_OR_DEPARTMENT_OTHER): Payer: Medicare HMO | Admitting: Hematology

## 2023-04-03 VITALS — BP 138/76 | HR 98 | Temp 98.2°F | Resp 18 | Wt 274.8 lb

## 2023-04-03 DIAGNOSIS — Z95828 Presence of other vascular implants and grafts: Secondary | ICD-10-CM

## 2023-04-03 DIAGNOSIS — C760 Malignant neoplasm of head, face and neck: Secondary | ICD-10-CM | POA: Diagnosis not present

## 2023-04-03 DIAGNOSIS — Z7189 Other specified counseling: Secondary | ICD-10-CM

## 2023-04-03 DIAGNOSIS — C320 Malignant neoplasm of glottis: Secondary | ICD-10-CM | POA: Diagnosis not present

## 2023-04-03 DIAGNOSIS — Z5112 Encounter for antineoplastic immunotherapy: Secondary | ICD-10-CM | POA: Diagnosis not present

## 2023-04-03 DIAGNOSIS — Z79899 Other long term (current) drug therapy: Secondary | ICD-10-CM | POA: Diagnosis not present

## 2023-04-03 DIAGNOSIS — Z87891 Personal history of nicotine dependence: Secondary | ICD-10-CM | POA: Diagnosis not present

## 2023-04-03 LAB — CMP (CANCER CENTER ONLY)
ALT: 7 U/L (ref 0–44)
AST: 11 U/L — ABNORMAL LOW (ref 15–41)
Albumin: 4.1 g/dL (ref 3.5–5.0)
Alkaline Phosphatase: 83 U/L (ref 38–126)
Anion gap: 4 — ABNORMAL LOW (ref 5–15)
BUN: 15 mg/dL (ref 8–23)
CO2: 30 mmol/L (ref 22–32)
Calcium: 9.6 mg/dL (ref 8.9–10.3)
Chloride: 105 mmol/L (ref 98–111)
Creatinine: 1.12 mg/dL (ref 0.61–1.24)
GFR, Estimated: >60 mL/min (ref >60)
Glucose, Bld: 150 mg/dL — ABNORMAL HIGH (ref 70–99)
Potassium: 4.2 mmol/L (ref 3.5–5.1)
Sodium: 139 mmol/L (ref 135–145)
Total Bilirubin: 0.3 mg/dL (ref <1.2)
Total Protein: 7.7 g/dL (ref 6.5–8.1)

## 2023-04-03 LAB — CBC WITH DIFFERENTIAL (CANCER CENTER ONLY)
Abs Immature Granulocytes: 0.02 10*3/uL (ref 0.00–0.07)
Basophils Absolute: 0.1 10*3/uL (ref 0.0–0.1)
Basophils Relative: 1 %
Eosinophils Absolute: 0.1 10*3/uL (ref 0.0–0.5)
Eosinophils Relative: 2 %
HCT: 32.3 % — ABNORMAL LOW (ref 39.0–52.0)
Hemoglobin: 10.6 g/dL — ABNORMAL LOW (ref 13.0–17.0)
Immature Granulocytes: 0 %
Lymphocytes Relative: 23 %
Lymphs Abs: 1.5 10*3/uL (ref 0.7–4.0)
MCH: 29.1 pg (ref 26.0–34.0)
MCHC: 32.8 g/dL (ref 30.0–36.0)
MCV: 88.7 fL (ref 80.0–100.0)
Monocytes Absolute: 0.5 10*3/uL (ref 0.1–1.0)
Monocytes Relative: 8 %
Neutro Abs: 4.4 10*3/uL (ref 1.7–7.7)
Neutrophils Relative %: 66 %
Platelet Count: 319 10*3/uL (ref 150–400)
RBC: 3.64 MIL/uL — ABNORMAL LOW (ref 4.22–5.81)
RDW: 13.7 % (ref 11.5–15.5)
WBC Count: 6.5 10*3/uL (ref 4.0–10.5)
nRBC: 0 % (ref 0.0–0.2)

## 2023-04-03 LAB — TSH: TSH: 2.453 u[IU]/mL (ref 0.350–4.500)

## 2023-04-03 MED ORDER — SODIUM CHLORIDE 0.9 % IV SOLN
Freq: Once | INTRAVENOUS | Status: AC
Start: 2023-04-03 — End: 2023-04-03

## 2023-04-03 MED ORDER — SODIUM CHLORIDE 0.9 % IV SOLN
200.0000 mg | Freq: Once | INTRAVENOUS | Status: AC
  Administered 2023-04-03: 200 mg via INTRAVENOUS
  Filled 2023-04-03: qty 200

## 2023-04-03 MED ORDER — SODIUM CHLORIDE 0.9% FLUSH
10.0000 mL | INTRAVENOUS | Status: DC | PRN
  Administered 2023-04-03: 10 mL

## 2023-04-03 MED ORDER — SODIUM CHLORIDE 0.9% FLUSH
10.0000 mL | Freq: Once | INTRAVENOUS | Status: AC
  Administered 2023-04-03: 10 mL

## 2023-04-03 MED ORDER — HEPARIN SOD (PORK) LOCK FLUSH 100 UNIT/ML IV SOLN
500.0000 [IU] | Freq: Once | INTRAVENOUS | Status: AC | PRN
Start: 2023-04-03 — End: 2023-04-03
  Administered 2023-04-03: 500 [IU]

## 2023-04-03 NOTE — Patient Instructions (Signed)
[facility] - A DEPT OF [facility]  Discharge Instructions: Thank you for choosing [facility] to provide your oncology and hematology care.   If you have a lab appointment with the Cancer Center, please go directly to the Cancer Center and check in at the registration area.   Wear comfortable clothing and clothing appropriate for easy access to any Portacath or PICC line.   We strive to give you quality time with your provider. You may need to reschedule your appointment if you arrive late (15 or more minutes).  Arriving late affects you and other patients whose appointments are after yours.  Also, if you miss three or more appointments without notifying the office, you may be dismissed from the clinic at the provider's discretion.      For prescription refill requests, have your pharmacy contact our office and allow 72 hours for refills to be completed.    Today you received the following chemotherapy and/or immunotherapy agents Rande Lawman      To help prevent nausea and vomiting after your treatment, we encourage you to take your nausea medication as directed.  BELOW ARE SYMPTOMS THAT SHOULD BE REPORTED IMMEDIATELY: *FEVER GREATER THAN 100.4 F (38 C) OR HIGHER *CHILLS OR SWEATING *NAUSEA AND VOMITING THAT IS NOT CONTROLLED WITH YOUR NAUSEA MEDICATION *UNUSUAL SHORTNESS OF BREATH *UNUSUAL BRUISING OR BLEEDING *URINARY PROBLEMS (pain or burning when urinating, or frequent urination) *BOWEL PROBLEMS (unusual diarrhea, constipation, pain near the anus) TENDERNESS IN MOUTH AND THROAT WITH OR WITHOUT PRESENCE OF ULCERS (sore throat, sores in mouth, or a toothache) UNUSUAL RASH, SWELLING OR PAIN  UNUSUAL VAGINAL DISCHARGE OR ITCHING   Items with * indicate a potential emergency and should be followed up as soon as possible or go to the Emergency Department if any problems should occur.  Please show the CHEMOTHERAPY ALERT CARD or IMMUNOTHERAPY  ALERT CARD at check-in to the Emergency Department and triage nurse.  Should you have questions after your visit or need to cancel or reschedule your appointment, please contact [facility] - A DEPT OF [facility]  Dept: [phone]  and follow the prompts.  Office hours are 8:00 a.m. to 4:30 p.m. Monday - Friday. Please note that voicemails left after 4:00 p.m. may not be returned until the following business day.  We are closed weekends and major holidays. You have access to a nurse at all times for urgent questions. Please call the main number to the clinic Dept: [phone] and follow the prompts.   For any non-urgent questions, you may also contact your provider using MyChart. We now offer e-Visits for anyone 33 and older to request care online for non-urgent symptoms. For details visit [email or web address].   Also download the MyChart app! Go to the app store, search "MyChart", open the app, select [facility], and log in with your MyChart username and password.

## 2023-04-03 NOTE — Progress Notes (Signed)
Patient seen by Dr. Kale  Vitals are within treatment parameters.  Labs reviewed: and are within treatment parameters.  Per physician team, patient is ready for treatment and there are NO modifications to the treatment plan.  

## 2023-04-03 NOTE — Progress Notes (Signed)
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  HISTORY OF PRESENTING ILLNESS:   Jesus [name] 67 y.o. male is here because of left lower extremity edema and lymphadenopathy.  The patient was seen in the emergency room this past Friday for the same issue.  A CT of the abdomen and pelvis was performed showing bulky left inguinal, left hemipelvic, and retroperitoneal adenopathy.  He was referred to [place] from the emergency room for further evaluation.  Doppler ultrasound of the left lower extremity was performed and was negative for DVT. Patient reports that he has been having left lower extremity edema in his left groin and left leg for approximately 1 month.  He states that the swelling in the left groin started to get better but then worsened.  The left lower extremity edema has slowly worsened over time.  Patient denies having fevers and chills.  He reports that he does have night sweats at times.  He reported having headaches approximate 1 [place]  He thinks his headaches are related to not having his blood pressure medication.  His headaches have now resolved.  He denies visual changes.  The patient denies chest pain, shortness of breath and cough.  No nausea, vomiting, constipation, diarrhea.  Denies abdominal pain.  The patient denies recent weight loss and has actually gained weight  recently.  Patient denies epistaxis, bleeding gums, hemoptysis, hematuria, but occasionally, and melena.  He reports increased urinary frequency over the past month but no dysuria.  The patient is here for evaluation and discussion of his recent CT and lab findings.  Interval History:  [name] 67 y.o. male who is here for continued evaluation and management of newly noted metastatic laryngeal squamous cell carcinoma. He is here to start cycle 12 day 1 of Pembrolizumab.  Patient was last seen by me in 03/13/2023 and he complained of mild throat pain when eating certain food, but was doing well overall.   Patient notes he has been doing well overall since our last visit. He complains of cough and throat irritation with certain food, like potato chips, and complains of neck soreness upon movement.   He also complains of mild occasional foggy/blurry vision.   He denies any new infection issues, fever, chills, night sweats, unexpected weight loss, back pain, abdominal pain, chest pain, or leg swelling.   He has been eating well overall and exercising.   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- last radiation 06-2018   Cataract    removed both eyes with l;ens implants    COPD (chronic obstructive pulmonary disease) (HCC)    Family history of colon cancer    in  his brother- dx'd age 96    History of chemotherapy    last 06-01-2018   History of kidney stones    History of radiation therapy    last radiation 06-2018   Hyperlipidemia    currently under control   Hypertension    Irregular heart beats    Lymphadenopathy    Pain, lower leg    Bilateral   Peripheral arterial disease (HCC)    Pre-diabetes    Red-green color blindness    RLS (restless legs syndrome)     Snores    Wears glasses     SURGICAL HISTORY: Past Surgical History:  Procedure Laterality Date   CATARACT EXTRACTION W/ INTRAOCULAR LENS  IMPLANT, BILATERAL     COLONOSCOPY     DIRECT LARYNGOSCOPY Right 02/03/2022   Procedure: DIRECT LARYNGOSCOPY WITH BIOPSY OF RIGHT FALSE VOCAL CORD;  Surgeon: Osborn Coho, MD;  Location: [facility] OR;  Service: ENT;  Laterality: Right;   dislodged salava stone     FRACTURE SURGERY     HAND ARTHROPLASTY  1995   crushed left hand   INGUINAL LYMPH NODE BIOPSY Left 01/02/2018   Procedure: LEFT INGUINAL LYMPH NODE BIOPSY;  Surgeon: Emelia Loron, MD;  Location: [facility] OR;  Service: General;  Laterality: Left;   IR IMAGING GUIDED PORT INSERTION  01/15/2018   IR IMAGING GUIDED PORT INSERTION  08/10/2022   IR REMOVAL TUN ACCESS W/ PORT W/O FL MOD SED  03/11/2019   MEDIASTINOSCOPY N/A 07/22/2022   Procedure: MEDIASTINOSCOPY;  Surgeon: Loreli Slot, MD;  Location: [facility] OR;  Service: Thoracic;  Laterality: N/A;   MICROLARYNGOSCOPY Left 01/17/2014   Procedure: MICROLARYNGOSCOPY WITH EXCISION OF THE BIOPSY OF LEFT VOCAL CORD LESION;  Surgeon: Serena Colonel, MD;  Location: [facility];  Service: ENT;  Laterality: Left;   MICROLARYNGOSCOPY N/A 09/16/2020   Procedure: MICROLARYNGOSCOPY with Biopsy of vocal cord lesion;  Surgeon: Osborn Coho, MD;  Location: [facility] OR;  Service: ENT;  Laterality: N/A;   ORIF FOOT FRACTURE  2005   left   REFRACTIVE SURGERY Right    removed cloudiness in right eye after cataract removal     SOCIAL HISTORY: Social History   Socioeconomic History   Marital status: Divorced    Spouse name: Not on file   Number of children: 3   Years of education: Not on file   Highest education level: Not on file  Occupational History   Not on file  Tobacco Use   Smoking status: Former    Current packs/day: 0.00    Average packs/day: 0.5 packs/day for 36.0 years (18.0 ttl pk-yrs)    Types: Cigarettes    Start date: 09/28/1984     Quit date: 09/28/2020    Years since quitting: 2.5   Smokeless tobacco: Never   Tobacco comments:    he denies smoking in about 2 weeks 07/13/18  Vaping Use   Vaping status: Never Used  Substance and Sexual Activity   Alcohol use: Yes    Alcohol/week: 6.0 standard drinks of alcohol    Types: 6 Cans of beer per week    Comment: Last use: Friday 30th   Drug use: Yes    Types: Marijuana, Cocaine    Comment: Last [facility] Use: Friday 30th.   Sexual activity: Not Currently  Other Topics Concern   Not on file  Social History Narrative   Not on file   Social Determinants of Health   Financial Resource Strain: Low Risk  (10/06/2020)  Overall Financial Resource Strain (CARDIA)    Difficulty of Paying Living Expenses: Not very hard  Food Insecurity: No Food Insecurity (11/09/2022)   Hunger Vital Sign    Worried About Running Out of Food in the Last Year: Never true    Ran Out of Food in the Last Year: Never true  Transportation Needs: No Transportation Needs (11/09/2022)   PRAPARE - Administrator, Civil Service (Medical): No    Lack of Transportation (Non-Medical): No  Physical Activity: Not on file  Stress: Not on file  Social Connections: Moderately Integrated (10/06/2020)   Social Connection and Isolation Panel [NHANES]    Frequency of Communication with Friends and Family: Three times a week    Frequency of Social Gatherings with Friends and Family: Three times a week    Attends Religious Services: 1 to 4 times per year    Active Member of Clubs or Organizations: Yes    Attends Banker Meetings: 1 to 4 times per year    Marital Status: Divorced  Intimate Partner Violence: Not At Risk (11/09/2022)   Humiliation, Afraid, Rape, and Kick questionnaire    Fear of Current or Ex-Partner: No    Emotionally Abused: No    Physically Abused: No    Sexually Abused: No    FAMILY HISTORY: Family History  Problem Relation Age of Onset   Breast cancer Mother    Diabetes  Father    Hypertension Father    Stroke Father    Mental illness Sister    Hypertension Daughter    Mental illness Daughter    Hypertension Brother    Colon cancer Brother 33       passed away 12-Dec-2018   Breast cancer Sister    Esophageal cancer Neg Hx    Colon polyps Neg Hx    Rectal cancer Neg Hx    Stomach cancer Neg Hx     ALLERGIES:  is allergic to bee venom, lisinopril, antifungal [miconazole nitrate], and zolpidem tartrate er.  MEDICATIONS:  Current Outpatient Medications  Medication Sig Dispense Refill   ACCU-CHEK GUIDE test strip 1 each by Other route as needed for other.     Accu-Chek Softclix Lancets lancets      acetaminophen (TYLENOL) 500 MG tablet Take 1,500 mg by mouth daily as needed for moderate pain.     amLODipine (NORVASC) 10 MG tablet TAKE 1 TABLET BY MOUTH EVERY DAY (Patient not taking: Reported on 01/11/2023) 30 tablet 0   aspirin EC 81 MG tablet Take 81 mg by mouth every evening. Swallow whole.     b complex vitamins capsule Take 1 capsule by mouth daily.     HYDROcodone-acetaminophen (HYCET) 7.5-325 mg/15 ml solution Take 10-15 mLs by mouth 4 (four) times daily as needed for moderate pain. Take with food. 300 mL 0   lidocaine (XYLOCAINE) 2 % solution Patient: Mix 1part 2% viscous lidocaine, 1part H20. Swallow 10mL of diluted mixture, before meals and at bedtime, up to QID to help sore throat 200 mL 3   lidocaine-prilocaine (EMLA) cream Apply to affected area once 30 g 3   lisinopril (ZESTRIL) 20 MG tablet TAKE 1 TABLET BY MOUTH EVERY DAY (Patient taking differently: Take 20 mg by mouth every evening.) 30 tablet 0   magic mouthwash w/lidocaine SOLN Take 5 mLs by mouth 4 (four) times daily as needed for mouth pain. Compound 400 mL with the following 80 ML of distilled water 80 mL of Maalox 80 mL  of nystatin 500,000 units per 5 mL 80 mL of 2% viscous lidocaine 80 mL of Benadryl 12.5mg  per 5 mL 400 mL 0   metFORMIN (GLUCOPHAGE) 500 MG tablet Take 500 mg by  mouth 2 (two) times daily with a meal.     Multiple Vitamins-Minerals (MULTI ADULT GUMMIES PO) Take 1 tablet by mouth in the morning. Centrum     ondansetron (ZOFRAN) 8 MG tablet Take 1 tablet (8 mg total) by mouth every 8 (eight) hours as needed for nausea or vomiting. Start on the third day after carboplatin. 30 tablet 1   oxyCODONE (OXY IR/ROXICODONE) 5 MG immediate release tablet Take 1 tablet (5 mg total) by mouth every 6 (six) hours as needed for moderate pain or breakthrough pain. 20 tablet 0   prochlorperazine (COMPAZINE) 10 MG tablet Take 1 tablet (10 mg total) by mouth every 6 (six) hours as needed for nausea or vomiting. 30 tablet 1   sucralfate (CARAFATE) 1 g tablet Take 1 tablet (1 g total) by mouth 2 (two) times daily. 60 tablet 0   tadalafil (CIALIS) 20 MG tablet Take 20 mg by mouth daily as needed for erectile dysfunction.     No current facility-administered medications for this visit.    REVIEW OF SYSTEMS:   10 Point review of Systems was done is negative except as noted above.  PHYSICAL EXAMINATION: .BP 138/76 (BP Location: Left Arm, Patient Position: Sitting)   Pulse 98   Temp 98.2 F (36.8 C) (Oral)   Resp 18   Wt 274 lb 12.8 oz (124.6 kg)   SpO2 100%   BMI 37.27 kg/m  . GENERAL:alert, in no acute distress and comfortable SKIN: no acute rashes, no significant lesions EYES: conjunctiva are pink and non-injected, sclera anicteric OROPHARYNX: MMM, no exudates, no oropharyngeal erythema or ulceration NECK: supple, no JVD LYMPH:  no palpable lymphadenopathy in the cervical, axillary or inguinal regions LUNGS: clear to auscultation b/l with normal respiratory effort HEART: regular rate & rhythm ABDOMEN:  normoactive bowel sounds , non tender, not distended. Extremity: no pedal edema PSYCH: alert & oriented x 3 with fluent speech NEURO: no focal motor/sensory deficits   LABORATORY DATA:   I have reviewed the data as listed    Latest Ref Rng & Units 03/13/2023     1:24 PM 02/21/2023   12:42 PM 01/30/2023   11:56 AM  CBC  WBC 4.0 - 10.5 K/uL 5.0  5.3  4.0   Hemoglobin 13.0 - 17.0 g/dL 38.7  56.4  8.9   Hematocrit 39.0 - 52.0 % 31.3  31.3  28.0   Platelets 150 - 400 K/uL 327  332  404    CBC    Component Value Date/Time   WBC 5.0 03/13/2023 1324   WBC 6.5 07/19/2022 1400   RBC 3.49 (L) 03/13/2023 1324   HGB 10.5 (L) 03/13/2023 1324   HCT 31.3 (L) 03/13/2023 1324   PLT 327 03/13/2023 1324   MCV 89.7 03/13/2023 1324   MCV 87.3 12/22/2017 1518   MCH 30.1 03/13/2023 1324   MCHC 33.5 03/13/2023 1324   RDW 14.5 03/13/2023 1324   LYMPHSABS 1.4 03/13/2023 1324   MONOABS 0.5 03/13/2023 1324   EOSABS 0.2 03/13/2023 1324   BASOSABS 0.0 03/13/2023 1324       Latest Ref Rng & Units 03/13/2023    1:24 PM 02/21/2023   12:42 PM 01/30/2023   11:56 AM  CMP  Glucose 70 - 99 mg/dL 332  951  884  BUN 8 - 23 mg/dL 19  16  26    Creatinine 0.61 - 1.24 mg/dL 1.61  0.96  0.45   Sodium 135 - 145 mmol/L 136  137  138   Potassium 3.5 - 5.1 mmol/L 4.3  4.3  4.4   Chloride 98 - 111 mmol/L 104  102  103   CO2 22 - 32 mmol/L 28  28  29    Calcium 8.9 - 10.3 mg/dL 9.5  9.7  40.9   Total Protein 6.5 - 8.1 g/dL 7.5  7.6  7.8   Total Bilirubin <1.2 mg/dL 0.3  0.4  0.4   Alkaline Phos 38 - 126 U/L 88  79  74   AST 15 - 41 U/L 11  10  14    ALT 0 - 44 U/L 8  7  12     . Lab Results  Component Value Date   LDH 168 05/18/2022   Tsh 1.42 (05/21/2021)   01/02/18 Left Inguinal LN Bx:   12/26/17 Tissue Flow Cytometry:   12/26/17 Inguinal Core biopsy:      RADIOGRAPHIC STUDIES: I have personally reviewed the radiological images as listed and agreed with the findings in the report. No results found.  ASSESSMENT & PLAN:   This is a pleasant 67 y.o. 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   CT of the abdomen and pelvis performed on 12/22/2017 showed bulky left inguinal, left hemipelvic, and  retroperitoneal adenopathy.     01/02/18 Left inguinal LN Biopsy revealed T-Cell/histocyte rich Large B-Cell Lymphoma   12/27/17 ECHO revealed LV EF of 55-60%    01/05/18 PET/CT revealed Massively enlarged pelvic lymph nodes intense metabolic activity consistent lymphoma. 2. Additional hypermetabolic lymph nodes in the porta hepatis and retroperitoneum LEFT aorta. 3. Solitary hypermetabolic mediastinal lymph node in the upper LEFT Mediastinum. 4. 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 5. Normal spleen.     HIV non reactive on 12/22/2017.  Hep C and hep B serology negative.   03/14/18 PET/CT revealed PET-CT findings suggest an excellent response to chemotherapy. The abdominal lymphadenopathy has near completely resolved and demonstrates a near complete metabolic response. The pelvic and inguinal adenopathy has significantly decreased in size and the metabolic activity has significantly decreased. 2. Diffuse marrow activity likely due to chemotherapy and or marrow stimulating drugs. I do not see any discrete persistent lesions.    04/17/18 CT Head revealed Subtle mesial caudothalamic hypodensities may be artifact though, the could reflect encephalitis or Wernicke's encephalopathy. Consider MRI of the head with and without contrast. 2. Mild chronic small vessel ischemic changes    S/p 6 cycles of EPOCH-R completed on 06/01/18  06/28/18 PET/CT revealed Continued good response to treatment. No residual measurable or hypermetabolic abdominal lymphadenopathy and no recurrent osseous disease. 2. Interval decrease in size of the left operator region lymph node and also the left inguinal lymph node. However, the both have small foci of slightly increased  hypermetabolism which bears surveillance. 3. No new or progressive lymphadenopathy in the neck, chest, abdomen or pelvis.  S/p 39.6 Gy in 22 fractions between 07/25/18 and 08/23/18  02/13/2019 CT C/A/P ([id number]) ([id number]) revealed "1. Response to therapy of pelvic adenopathy compared to the PET of 06/28/2018. 2. No new or progressive disease. 3. Mild prostatomegaly.  4. 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 5. Hepatomegaly."   2) left lower extremity swelling- now resolved  Doppler ultrasound for DVT was negative in the left lower extremity.   Likely from venous compression +/- lymphatic obstruction from bulky left inguinal, left hemipelvic, and  retroperitoneal adenopathy.    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  6) Radiation mucositis  PLAN: -Discussed lab results from today, 04/03/2023, in detail with the patient. CBC shows slightly decreased hemoglobin of 10.6 g/dL with hematocrit of 11.9%. CMP stable.  -Schedule the patient for PET scan before our next visit. --scheduled on 04/14/2023 -Discussed with the patient that foggy vision can be related dry eyes due to heat or it can related to blood sugar levels.  -improved throat pain and po intake -Recommend influenza vaccine, COVID-19 Booster, and other age related vaccines.   -Patient has been tolerating his treatment well without any new or severe toxicities.  -Patient will continue to receive immunotherapy (Pembrolizumab) without any dose modification.  -Advised patient to eat well and stay well hydrated.   Pet/ct in 2 weeks  FOLLOW-UP: Pet/ct in 2 weeks -Continue Keytruda per integrated scheduling with port flush labs and MD visits  The total time spent in the appointment was 30 minutes* .  All of the patient's questions were answered with apparent satisfaction. The patient knows to call the clinic with any problems, questions or concerns.   Wyvonnia Lora MD MS AAHIVMS [facility] Hematology/Oncology Physician [facility]  .*Total Encounter Time as defined by the Centers for Medicare and Medicaid Services includes, in addition to the face-to-face time of a patient visit (documented in the note above) non-face-to-face time: obtaining and reviewing outside history, ordering and reviewing medications, tests or procedures, care coordination (communications with other health care professionals or caregivers) and documentation in the medical record.   I,Param Shah,acting as a Neurosurgeon for Wyvonnia Lora, MD.,have documented all relevant documentation on the behalf of Wyvonnia Lora, MD,as directed by  Wyvonnia Lora, MD while in the presence of Wyvonnia Lora, MD.  .I have reviewed the above documentation for accuracy and completeness, and I agree with the above. Johney Maine MD

## 2023-04-04 LAB — T4: T4, Total: 7.6 ug/dL (ref 4.5–12.0)

## 2023-04-06 ENCOUNTER — Other Ambulatory Visit: Payer: Self-pay

## 2023-04-07 ENCOUNTER — Other Ambulatory Visit: Payer: Self-pay

## 2023-04-09 ENCOUNTER — Encounter: Payer: Self-pay | Admitting: Hematology

## 2023-04-13 ENCOUNTER — Other Ambulatory Visit: Payer: Self-pay

## 2023-04-14 ENCOUNTER — Encounter (HOSPITAL_COMMUNITY)
Admission: RE | Admit: 2023-04-14 | Discharge: 2023-04-14 | Disposition: A | Discharge status: Home / Self Care | Payer: Medicare HMO | Source: Ambulatory Visit | Attending: Radiation Oncology | Admitting: Radiation Oncology

## 2023-04-14 DIAGNOSIS — C320 Malignant neoplasm of glottis: Secondary | ICD-10-CM | POA: Diagnosis not present

## 2023-04-14 DIAGNOSIS — C4492 Squamous cell carcinoma of skin, unspecified: Secondary | ICD-10-CM | POA: Insufficient documentation

## 2023-04-14 DIAGNOSIS — C771 Secondary and unspecified malignant neoplasm of intrathoracic lymph nodes: Secondary | ICD-10-CM | POA: Insufficient documentation

## 2023-04-14 LAB — GLUCOSE, CAPILLARY: Glucose-Capillary: 128 mg/dL — ABNORMAL HIGH (ref 70–99)

## 2023-04-14 MED ORDER — FLUDEOXYGLUCOSE F - 18 (FDG) INJECTION
13.6700 | Freq: Once | INTRAVENOUS | Status: AC
  Administered 2023-04-14: 13.67 via INTRAVENOUS

## 2023-04-18 ENCOUNTER — Ambulatory Visit
Admission: RE | Admit: 2023-04-18 | Discharge: 2023-04-18 | Disposition: A | Discharge status: Home / Self Care | Payer: Medicare HMO | Source: Ambulatory Visit | Attending: Radiation Oncology | Admitting: Radiation Oncology

## 2023-04-18 VITALS — BP 126/76 | HR 103 | Temp 97.7°F | Resp 18 | Ht 72.0 in | Wt 272.0 lb

## 2023-04-18 DIAGNOSIS — C320 Malignant neoplasm of glottis: Secondary | ICD-10-CM | POA: Insufficient documentation

## 2023-04-18 DIAGNOSIS — C4492 Squamous cell carcinoma of skin, unspecified: Secondary | ICD-10-CM | POA: Insufficient documentation

## 2023-04-18 DIAGNOSIS — C8338 Diffuse large B-cell lymphoma, lymph nodes of multiple sites: Secondary | ICD-10-CM | POA: Diagnosis not present

## 2023-04-18 DIAGNOSIS — C771 Secondary and unspecified malignant neoplasm of intrathoracic lymph nodes: Secondary | ICD-10-CM | POA: Insufficient documentation

## 2023-04-18 DIAGNOSIS — C760 Malignant neoplasm of head, face and neck: Secondary | ICD-10-CM | POA: Insufficient documentation

## 2023-04-18 MED ORDER — OXYMETAZOLINE HCL 0.05 % NA SOLN
1.0000 | Freq: Once | NASAL | Status: AC
Start: 2023-04-18 — End: 2023-04-18
  Administered 2023-04-18: 1 via NASAL
  Filled 2023-04-18: qty 30

## 2023-04-18 NOTE — Progress Notes (Signed)
Patient is here to receive results from his recent PET scan.  Patient states he is doing well, he states his throat is still a little sore but he is able to eat and drink well.  He is getting in about 48-64 ounces of water daily.  Denies any pains or fatigue. He reports his weight has been stable.   BP 126/76 (BP Location: Left Arm, Patient Position: Sitting)   Pulse (!) 103   Temp 97.7 F (36.5 C) (Temporal)   Resp 18   Ht 6' (1.829 m)   Wt 272 lb (123.4 kg)   SpO2 100%   BMI 36.89 kg/m   Wt Readings from Last 3 Encounters:  04/18/23 272 lb (123.4 kg)  04/03/23 274 lb 12.8 oz (124.6 kg)  03/13/23 276 lb 6.4 oz (125.4 kg)     Lind Covert RN, BSN

## 2023-04-18 NOTE — Progress Notes (Addendum)
Radiation Oncology         [phone] ________________________________  Outpatient  Followup  Name: Jesus [name] MRN: [id number]  Date: 04/18/2023  DOB: [birth year]  UX:LKGMWN, Tawni Pummel, MD  Johney Maine, MD   REFERRING PHYSICIAN: Johney Maine, MD  DIAGNOSIS:    ICD-10-CM   1. 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HCC)  C83.38     3. Head and neck cancer (HCC)  C76.0     4. 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    Cancer Staging  Diffuse large B-cell lymphoma of lymph nodes of multiple regions [facility]) Staging form: Hodgkin and Non-Hodgkin Lymphoma, AJCC 8th Edition - Clinical: Stage IV - Signed by Lonie Peak, MD on 07/13/2018  Malignant neoplasm of glottis [facility]) Staging form: Larynx - Glottis, AJCC 8th Edition - Clinical stage from 10/02/2020: Stage I (cT1a, cN0, cM0) - Signed by Lonie Peak, MD on 10/02/2020  PREVIOUS RADIATION THERAPY:   3) First Treatment Date: 2022-11-29 - Last Treatment Date: 2023-01-13  Plan Name: HN_NckChst (right neck and upper thoracic adenopathy treated) Site: Neck Technique: IMRT Mode: Photon Dose Per Fraction: 2 Gy Prescribed Dose (Delivered / Prescribed): 60 Gy / 60 Gy Prescribed Fxs (Delivered / Prescribed): 30 / 30    2) Diagnoses: C32.0-Malignant neoplasm of glottis  Intent: Curative  Radiation Treatment Dates: 10/15/2020 through 11/25/2020 Site Technique Total Dose (Gy) Dose per Fx (Gy) Completed Fx Beam Energies  Larynx: HN_larynx 3D 63/63 2.25 28/28 6X   1) Diagnoses: C83.38-Diffuse large b-cell lymphoma, lymph  nodes of multiple sites Intent: Curative Radiation Treatment Dates: 07/25/2018 through 08/23/2018 Site Technique Total Dose Dose per Fx Completed Fx Beam Energies  Pelvis: Pelvis IMRT 39.6/39.6 1.8 22/22 6X    NARRATIVE: Patient states he is doing well, he states his throat is still a little sore but he is able to eat and drink well.  He is getting in about 48-64 ounces of water daily.  Denies any pains or fatigue. Able to eat anything as long as it's chewed well  Continues pembrolizumab and tolerates it well under the care of Dr Candise Che  He has pictures today of his new granddaughter and she is adorable    ALLERGIES: Bee venom, Lisinopril, Antifungal [miconazole nitrate], and Zolpidem tartrate er  MEDICATIONS:  Current Outpatient Medications  Medication Sig Dispense Refill   ACCU-CHEK GUIDE test strip 1 each by Other route as needed for other.     Accu-Chek Softclix Lancets lancets      acetaminophen (TYLENOL) 500 MG tablet Take 1,500 mg by mouth daily as needed for moderate pain.     aspirin EC 81 MG tablet Take 81 mg by mouth every evening. Swallow whole.     b complex vitamins capsule Take 1 capsule by mouth daily.     lidocaine (XYLOCAINE) 2 % solution Patient: Mix 1part 2% viscous lidocaine, 1part H20. Swallow 10mL of diluted mixture, before meals and at bedtime, up to QID to help sore throat 200 mL 3   lidocaine-prilocaine (EMLA) cream Apply to affected area once 30  g 3   metFORMIN (GLUCOPHAGE) 500 MG tablet Take 500 mg by mouth 2 (two) times daily with a meal.     Multiple Vitamins-Minerals (MULTI ADULT GUMMIES PO) Take 1 tablet by mouth in the morning. Centrum     oxyCODONE (OXY IR/ROXICODONE) 5 MG immediate release tablet Take 1 tablet (5 mg total) by mouth every 6 (six) hours as needed for moderate pain or breakthrough pain. 20 tablet 0   prochlorperazine (COMPAZINE) 10 MG tablet Take 1 tablet (10 mg total) by mouth every 6 (six) hours as needed for nausea or vomiting. 30  tablet 1   tadalafil (CIALIS) 20 MG tablet Take 20 mg by mouth daily as needed for erectile dysfunction.     amLODipine (NORVASC) 10 MG tablet TAKE 1 TABLET BY MOUTH EVERY DAY (Patient not taking: Reported on 01/11/2023) 30 tablet 0   HYDROcodone-acetaminophen (HYCET) 7.5-325 mg/15 ml solution Take 10-15 mLs by mouth 4 (four) times daily as needed for moderate pain. Take with food. 300 mL 0   lisinopril (ZESTRIL) 20 MG tablet TAKE 1 TABLET BY MOUTH EVERY DAY (Patient taking differently: Take 20 mg by mouth every evening.) 30 tablet 0   magic mouthwash w/lidocaine SOLN Take 5 mLs by mouth 4 (four) times daily as needed for mouth pain. Compound 400 mL with the following 80 ML of distilled water 80 mL of Maalox 80 mL of nystatin 500,000 units per 5 mL 80 mL of 2% viscous lidocaine 80 mL of Benadryl 12.5mg  per 5 mL 400 mL 0   ondansetron (ZOFRAN) 8 MG tablet Take 1 tablet (8 mg total) by mouth every 8 (eight) hours as needed for nausea or vomiting. Start on the third day after carboplatin. 30 tablet 1   sucralfate (CARAFATE) 1 g tablet Take 1 tablet (1 g total) by mouth 2 (two) times daily. 60 tablet 0   No current facility-administered medications for this encounter.    REVIEW OF SYSTEMS:  Notable for that above.   PHYSICAL EXAM:  height is 6' (1.829 m) and weight is 272 lb (123.4 kg). His temporal temperature is 97.7 F (36.5 C). His blood pressure is 126/76 and his pulse is 103 (abnormal). His respiration is 18 and oxygen saturation is 100%.   General: Alert and oriented, in no acute distress with stable hoarseness HEENT: Head is normocephalic. Extraocular movements are intact. No oral thrush Neck: No palpable cervical adenopathy.  No palpable supraclavicular adenopathy. Skin: No concerning lesions over face or neck.   Musculoskeletal: symmetric strength and muscle tone throughout. Neurologic: Cranial nerves II through XII are grossly intact. No obvious focalities. Speech is fluent.  Coordination is intact. Heart regular rate and rhythm Chest clear to auscultation bilaterally Abdomen is soft nontender nondistended Psychiatric: Judgment and insight are intact. Affect is appropriate.  PROCEDURE NOTE: After obtaining consent and spraying nasal cavity with topical oxymetazoline, the flexible endoscope was coated with lidocaine gel and introduced and passed through the nasal cavity.  The nasopharynx, oropharynx, hypopharynx, and larynx were then examined. No lesions appreciated in the mucosal axis. The true cords were symmetrically mobile. The patient tolerated the procedure well.  ECOG = 1  0 - Asymptomatic (Fully active, able to carry on all predisease activities without restriction)  1 - Symptomatic but completely ambulatory (Restricted in physically strenuous activity but ambulatory and able to carry out work of a light or sedentary nature. For example, light housework, office work)  2 - Symptomatic, <50% in bed during the day (Ambulatory and  capable of all self care but unable to carry out any work activities. Up and about more than 50% of waking hours)  3 - Symptomatic, >50% in bed, but not bedbound (Capable of only limited self-care, confined to bed or chair 50% or more of waking hours)  4 - Bedbound (Completely disabled. Cannot carry on any self-care. Totally confined to bed or chair)  5 - Death   Santiago Glad MM, Creech RH, Tormey DC, et al. [phone]). "Toxicity and response criteria of the [facility]". Am. Evlyn Clines. Oncol. 5 (6): 649-55   LABORATORY DATA:  Lab Results  Component Value Date   WBC 6.5 04/03/2023   HGB 10.6 (L) 04/03/2023   HCT 32.3 (L) 04/03/2023   MCV 88.7 04/03/2023   PLT 319 04/03/2023   CMP     Component Value Date/Time   NA 139 04/03/2023 1156   NA 143 08/21/2019 1459   K 4.2 04/03/2023 1156   CL 105 04/03/2023 1156   CO2 30 04/03/2023 1156   GLUCOSE 150 (H) 04/03/2023 1156   BUN 15 04/03/2023 1156   BUN 17  08/21/2019 1459   CREATININE 1.12 04/03/2023 1156   CREATININE 1.09 04/29/2014 1930   CALCIUM 9.6 04/03/2023 1156   PROT 7.7 04/03/2023 1156   PROT 7.0 08/21/2019 1459   ALBUMIN 4.1 04/03/2023 1156   ALBUMIN 4.7 08/21/2019 1459   AST 11 (L) 04/03/2023 1156   ALT 7 04/03/2023 1156   ALKPHOS 83 04/03/2023 1156   BILITOT 0.3 04/03/2023 1156   GFRNONAA >60 04/03/2023 1156   GFRNONAA 74 04/29/2014 1930   GFRAA >60 11/28/2019 1329   GFRAA 86 04/29/2014 1930      Lab Results  Component Value Date   TSH 2.453 04/03/2023     RADIOGRAPHY: NM PET Image Restag (PS) Skull Base To Thigh  Result Date: 04/18/2023 CLINICAL DATA:  Subsequent treatment strategy for head and neck cancer. 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 EXAM: NUCLEAR MEDICINE PET SKULL BASE TO THIGH TECHNIQUE: 13.67 mCi F-18 FDG was injected intravenously. Full-ring PET imaging was performed from the skull base to thigh after the radiotracer. CT data was obtained and used for attenuation correction and anatomic localization. Fasting blood glucose: 128 mg/dl COMPARISON:  PET-CT 13/24/4010 and 06/08/2022. FINDINGS: Mediastinal blood pool activity: SUV max 2.6 NECK: The previously demonstrated hypermetabolic and enlarged level II right cervical lymph node has decreased in size and hypermetabolic activity. It is not well defined on the CT images, although is significantly smaller with an SUV max 5.8 (previously 11.1). No other enlarged or hypermetabolic cervical lymph nodes are identified. There is increased focal activity along the arytenoid cartilage on the right (SUV max 8.4, previously 4.8) present no clear corresponding abnormality on the CT images, and this could reflect treatment change. No other suspicious activity within the pharyngeal mucosal space. Incidental CT findings: none CHEST: Previously demonstrated left paratracheal mass just inferior to the thoracic  inlet is ill-defined on the CT images, measuring approximately 3.8 x 2.4 cm (3.8 x 2.2 cm previously). On the PET images, this demonstrates peripheral hypermetabolic activity with an SV max of 10.2 (previously 10.6). Based on the PET images, this lesion appears slightly larger overall, although does demonstrate some central necrosis consistent with response to therapy. Stable low level activity within a right paratracheal node measuring 8 mm on image 66/4 (SUV max 5.0, previously 4.7). No other hypermetabolic thoracic lymph nodes. No hypermetabolic pulmonary  activity or suspicious nodularity. Incidental CT findings: Stable tracheal deviation to the right by the left paratracheal mass spread right IJ Port-A-Cath extends to the superior cavoatrial junction. ABDOMEN/PELVIS: There is no hypermetabolic activity within the liver, adrenal glands, spleen or pancreas. There is no hypermetabolic nodal activity in the abdomen or pelvis. Previously demonstrated prominent anorectal activity appears improved, and no highly suspicious bowel activity is currently demonstrated. Incidental CT findings: Unchanged bilateral renal cysts for which no specific follow-up imaging is recommended. Mild aortoiliac atherosclerosis. SKELETON: There is no hypermetabolic activity to suggest osseous metastatic disease. Incidental CT findings: Stable lumbar spondylosis and generalized trabecular thickening in the iliac bones bilaterally. IMPRESSION: 1. Interval significant decrease in size and hypermetabolic activity of the previously demonstrated right level II cervical nodal metastasis. 2. The previously demonstrated left paratracheal mass appears slightly larger overall, although does demonstrate some central necrosis consistent with response to therapy. 3. Increased focal activity along the right arytenoid cartilage without clear corresponding abnormality on the CT images, possibly treatment related. Correlate clinically. 4. Stable low level  activity within a right paratracheal lymph node. 5. No evidence of metastatic disease in the abdomen, pelvis or skeleton. 6.  Aortic Atherosclerosis (ICD10-I70.0). Electronically Signed   By: Carey Bullocks M.D.   On: 04/18/2023 09:02      IMPRESSION/PLAN:  This is a delightful patient with laryngeal head and neck cancer and lymphoma, s/p RT for both.   In September 2024 he completed 6 week of salvage RT for metastatic squamous cell carcinoma to right neck and mediastinum.  He continues pembrolizumab under the care of Dr. Candise Che  Doing well symptomatically.  PET scan reviewed today.  He has had an excellent response in the right neck.  The left paratracheal mass is slightly larger but has some central necrosis which is consistent with response to therapy.  The focal activity in the right arytenoid cartilage does not correspond to any concerning findings on today's laryngoscopy.  No new evidence of metastatic disease on imaging.  He will see medical oncology next week to discuss next steps regarding systemic therapy.  I will see him back as needed; there are no immediate indications for radiation therapy.  He is pleased with this plan   He is UTD on TSH labs and will continue to undergo lab work with Dr Candise Che. Lab Results  Component Value Date   TSH 2.453 04/03/2023     On date of service, in total, I spent 35 minutes on this encounter. Patient was seen in person.  __________________________________________   Lonie Peak, MD

## 2023-04-24 ENCOUNTER — Ambulatory Visit: Payer: Medicare HMO | Attending: Radiation Oncology

## 2023-04-24 DIAGNOSIS — R1313 Dysphagia, pharyngeal phase: Secondary | ICD-10-CM | POA: Diagnosis not present

## 2023-04-24 NOTE — Therapy (Signed)
OUTPATIENT SPEECH LANGUAGE PATHOLOGY ONCOLOGY TREATMENT/RECERTIFICATION   Patient Name: Jesus [name] MRN: [id number] DOB:1955-05-13, 67 y.o., male Today's Date: 04/24/2023  PCP: Noberto Retort REFERRING PROVIDER: Lonie Peak, MD  END OF SESSION:  End of Session - 04/24/23 1307     Visit Number 4    Number of Visits 7    Date for SLP Re-Evaluation 07/23/23    SLP Start Time 1232    SLP Stop Time  1310    SLP Time Calculation (min) 38 min    Activity Tolerance Patient tolerated treatment well              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- last radiation 06-2018   Cataract    removed both eyes with l;ens implants    COPD (chronic obstructive pulmonary disease) (HCC)    Family history of colon cancer    in his brother- dx'd age 74    History of chemotherapy    last 06-01-2018   History of kidney stones    History of radiation therapy    last radiation 06-2018   Hyperlipidemia    currently under control   Hypertension    Irregular heart beats    Lymphadenopathy    Pain, lower leg    Bilateral   Peripheral arterial disease (HCC)    Pre-diabetes    Red-green color blindness    RLS (restless legs syndrome)    Snores    Wears glasses    Past Surgical History:  Procedure Laterality Date   CATARACT EXTRACTION W/ INTRAOCULAR LENS  IMPLANT, BILATERAL     COLONOSCOPY     DIRECT LARYNGOSCOPY Right 02/03/2022   Procedure: DIRECT LARYNGOSCOPY WITH BIOPSY OF RIGHT FALSE VOCAL CORD;  Surgeon: Osborn Coho, MD;  Location: [facility] OR;  Service: ENT;  Laterality: Right;   dislodged salava stone     FRACTURE SURGERY     HAND ARTHROPLASTY  1995   crushed left hand   INGUINAL LYMPH NODE BIOPSY Left 01/02/2018   Procedure: LEFT INGUINAL LYMPH NODE BIOPSY;  Surgeon:  Emelia Loron, MD;  Location: [facility] OR;  Service: General;  Laterality: Left;   IR IMAGING GUIDED PORT INSERTION  01/15/2018   IR IMAGING GUIDED PORT INSERTION  08/10/2022   IR REMOVAL TUN ACCESS W/ PORT W/O FL MOD SED  03/11/2019   MEDIASTINOSCOPY N/A 07/22/2022   Procedure: MEDIASTINOSCOPY;  Surgeon: Loreli Slot, MD;  Location: [facility] OR;  Service: Thoracic;  Laterality: N/A;   MICROLARYNGOSCOPY Left 01/17/2014   Procedure: MICROLARYNGOSCOPY WITH EXCISION OF THE BIOPSY OF LEFT VOCAL CORD LESION;  Surgeon: Serena Colonel, MD;  Location: [facility];  Service: ENT;  Laterality: Left;   MICROLARYNGOSCOPY N/A 09/16/2020   Procedure: MICROLARYNGOSCOPY with Biopsy of vocal cord lesion;  Surgeon: Osborn Coho, MD;  Location: [facility] OR;  Service: ENT;  Laterality: N/A;   ORIF FOOT FRACTURE  2005   left   REFRACTIVE SURGERY Right    removed cloudiness in right eye after cataract removal    Patient Active Problem List   Diagnosis Date Noted   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   Pain due to onychomycosis of toenail 10/25/2022   Port-A-Cath  in place 09/05/2022   Head and neck cancer (HCC) 08/04/2022   Chronic pain 07/15/2021   Diabetes mellitus without complication (HCC) 07/15/2021   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   Gastroesophageal reflux disease without esophagitis 07/15/2021   History of lymphoma 07/15/2021   Lipoma of skin and subcutaneous tissue of trunk 07/15/2021   Mixed hyperlipidemia 07/15/2021   Prediabetes 07/15/2021   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   Tobacco dependence 07/15/2021   Trigger thumb of left hand 02/17/2021   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   Hoarseness 08/11/2020   Chronic pain of right knee 10/17/2018   Large cell (diffuse) non-Hodgkin's lymphoma (HCC) 05/10/2018   Bacteremia due to Escherichia coli    HTN  (hypertension) 04/17/2018   RLS (restless legs syndrome) 04/17/2018   Abnormal LFTs 04/17/2018   Acute metabolic encephalopathy 04/16/2018   AKI (acute kidney injury) (HCC) 04/16/2018   Dehydration    Fever, unspecified    Sepsis (HCC)    Disorientation    Non-Hodgkin's lymphoma of skin (HCC) 04/03/2018   Hypokalemia    Hypomagnesemia    Anemia    Encounter for antineoplastic chemotherapy    At high risk of tumor lysis syndrome    Swelling of lower leg    Diffuse large B cell lymphoma (HCC) 01/15/2018   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HCC) 01/12/2018   Counseling regarding advance care planning and goals of care 01/12/2018   Bilateral leg pain 05/27/2014    ONSET DATE: see "pertinent details"   REFERRING DIAG: 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 He presented to Dr. Basilio Cairo on 11/11/22 to discuss management of nodal metastases from a laryngeal primary referred by Dr. Candise Che. To review from last visit on 06/01/22 the patient had recently undergone a right neck lymph node biopsy on 05/25/22 which showed findings consistent with metastatic keratinizing squamous cell carcinoma. Biopsy was indicated by new right neck and mediastinal adenopathy which he first noticed in December 2023. (He soon after presented for an MRI of the brain on 06/02/22 which showed no intracranial abnormalities). Based on biopsy results, the patient followed up with Dr. Candise Che who recommended proceeding with a PET scan. Subsequent PET scan on 06/08/22 demonstrated: moderate hypermetabolism associated with the right cervical and mediastinal  metastatic lymphadenopathy; nonspecific mildly prominent activity within the nasopharynx and left pharyngeal tonsil (potentially reactive in etiology); and resolution of the previously demonstrated left pelvic and inguinal adenopathy. PET otherwise showed no evidence of distant metastatic disease and no findings suspicious for recurrent lymphoma. 07/05/22 He saw Dr. Ernestene Kiel at [facility] to discuss the biopsy findings. During that visit the patient endorsed increasing hoarseness over the past couple of weeks and intermittent bilateral ear pain. Dr. Ernestene Kiel recommended discussing at ENT TB for optimal treatment course. He was subsequently referred to Dr. Dorris Fetch on 07/14/22 who recommended proceeding with a mediastinoscopy for additional nodal biopsies/excisions of the left paratracheal adenopathy to confirm his diagnosis. Biopsies of the left paratracheal mass collected on 07/22/22 showed fibrous tissue involved by squamous cell carcinoma; nodal status of 3/3 lymph nodes negative  for metastatic carcinoma. Dr. Dorris Fetch did not recommend resection given that it was not possible to remove the visible disease.  Dr. Candise Che subsequently recommended proceeding with systemic treatment consisting of carbo/5FU/Pembrolizumab based on PdL1 status, followed by palliative radiation therapy to any residual disease s/p systemic treatment. He received his first dose on 08/15/22. Chemo toxicities reported by the patient throughout the course of systemic treatment included mild fatigue, nausea (improved with steroids) occasional bilateral hand and leg neuropathy due to Pembrolizumab, and swallowing problems. He received his 4th cycle of chemotherapy on 11/08/22. 11/07/22 PET demonstrated progressive enlargement and hypermetabolic activity within the previously demonstrated right level II cervical and left paratracheal lymph nodes consistent with progressive metastatic disease. PET otherwise showed no evidence of metastatic disease  in the abdomen or pelvis, and no evidence of osseous metastatic disease.Treatment plan:  He will receive 30 fractions of radiation to his right neck and mediastinum.  Radiation started 12/01/22 and will complete 01/13/23  PAIN:  Are you having pain? No  FALLS: Has patient fallen in last 6 months?  No   PATIENT GOALS: Pt indicated he would like to consistently swallow normally  OBJECTIVE:    TODAY'S TREATMENT:                                                                                                                                         DATE:   04/24/23: Pt reports potato chips, dense foods, rice have reduced pharyngeal clearance if he does not masticate thoroughly. Pt has completed other exercises but not been completing Mendelsohn - no appreciable fibrosis palpated today. However with peanut butter crackers and water today pt without any notable oral or pharyngeal deficits - "It's (crackers) disintegrating sort of like the Chez its; If it disintegrates it's easier to swallow going down." Pt was told that he should continue to chew food thoroughly, perform Mendelsohn along with the other exercises, clear throat occasionally with liquids, and take smaller bites and sips as delineated on MBS report. He was again provided with overt s/sx of aspiration PNA.  02/22/23: SLP educated pt about overt s/sx aspiration PNA. Pt has been having a wide variety of foods without reported difficulty. SLP educated pt about overt s/sx aspiration PNA, and he told SLP rationale for HEP with independence. Pt and SLP talked about how pt could modify eating salads to foster better pharyngeal clearance of lettuce (see pt instructions). Pt ate peanut butter crackers today and drank H2O without overt s/sx pharyngeal deficits, nor oral deficits.  HEP completed with independence, he has been completing 20 reps/day/exercise; SLP encouraged pt to complete no less than 20 reps/exercise/day. Pt agreed he could be seen in 2  months due to progress.   01/25/23:Pt dx/d with COVID 01/17/23 with cough x2 days, so recently hasn't felt like doing exercises. Pt performed exercises today with min A for Masako (tongue protrusion). MBS was re-scheduled to  01/31/23. Pt ate peanut butter crackers and drank water without overt s/sx oral or pharyngeal difficulty, including aspiration. Taking smaller bites.  When pt performed Masako he coughed while performing exercise due to taking a DRINK of water (pt was instructed to take a DROP of water). SLP reiterated a DROP of water and not a sip. Pt then performed Masako correctly using this strategy successfully. SLP reiterated at least 20 reps/day with no less than 5 reps at once. Before SLP was done saying this pt stated "...and don't do less than 5. I was doing more than 5 - I got more than 20 every day." SLP congratulated pt on this. SLP told pt to use two-three sips of water prior to taking meds to aid in pharyngeal transit.   12/22/22 (eval): Research states the risk for dysphagia increases due to radiation and/or chemotherapy treatment due to a variety of factors, so SLP educated the pt about the possibility of reduced/limited ability for PO intake during rad tx. SLP also educated pt regarding possible changes to swallowing musculature after rad tx, and why adherence to dysphagia HEP provided today and PO consumption was necessary to inhibit muscle fibrosis following rad tx and to mitigate muscle disuse atrophy. SLP informed pt why this would be detrimental to their swallowing status and to their pulmonary health. Pt demonstrated understanding of these things to SLP. SLP encouraged pt to safely eat and drink as deep into their radiation/chemotherapy as possible to provide the best possible long-term swallowing outcome for pt.    SLP then developed an individualized HEP for pt involving oral and pharyngeal strengthening and ROM and pt was instructed how to perform these exercises, including SLP  demonstration. After SLP demonstration, pt return demonstrated each exercise. SLP ensured pt performance was correct prior to educating pt on next exercise. Pt required occasional min-mod cues faded to modified independent to perform HEP. Pt was instructed to complete this program 6-7 days/week, at last 20 reps/day without any less than 5 reps at once, until 6 months after his last day of rad tx, and then x2 a week after that, indefinitely.    PATIENT EDUCATION: Education details: late effects head/neck radiation on swallow function, HEP procedure, and modification to HEP when difficulty experienced with swallowing during and after radiation course Person educated: Patient Education method: Explanation, Demonstration, Verbal cues, and Handouts Education comprehension: verbalized understanding, returned demonstration, verbal cues required, and needs further education   ASSESSMENT:  CLINICAL IMPRESSION: Recert today. Patient is a 67 y.o. M who was seen today for treatment of swallowing following radiation therapy ending on 01/13/23. He has been eating food items from regular diet. Today when pt ate items from regular diet and drank thin liquids, no s/sx of oral difficulties noted, At this time pt swallowing is deemed WNL/WFL with this diet and thin liquids. No overt s/sx aspiration were observed. See "today's treatment" for more details. There are no overt s/s aspiration PNA observed by SLP nor any reported by pt at this time. Data indicate that pt's swallow ability will likely decrease over the course of radiation/chemoradiation therapy and could very well decline over time following the conclusion of that therapy due to muscle disuse atrophy and/or muscle fibrosis. Pt will cont to need to be seen by SLP in order to assess safety of PO intake, assess the need for recommending any objective swallow assessment, and ensuring pt is correctly completing the individualized HEP.  OBJECTIVE IMPAIRMENTS: include  dysphagia. These impairments are limiting patient from  safety when swallowing. Factors affecting potential to achieve goals and functional outcome are  none noted . Patient will benefit from skilled SLP services to address above impairments and improve overall function.  REHAB POTENTIAL: Good   GOALS: Goals reviewed with patient? No  SHORT TERM GOALS: Target: 3rd total session        1.   Pt will complete HEP with modified independence in 2 sessions Baseline: 02/22/23 Goal status: Partially met   2.  pt will tell SLP why pt is completing HEP with modified independence Baseline:  Goal status: Met   3.  pt will describe 3 overt s/s aspiration PNA with modified independence Baseline:  Goal status: Met   4.  pt will tell SLP how a food journal could hasten return to a more normalized diet Baseline:  Goal status: Deferred -returned to WNL diet     LONG TERM GOALS: Target: 7th total session   1.  pt will complete HEP with independence over two visits Baseline: 04/24/23 Goal status: partially met and continue   2.  pt will describe how to modify HEP over time, and the timeline associated with reduction in HEP frequency with modified independence over two sessions Baseline:  Goal status: continue  3.  Pt will follow precautions from possible MBS in 2 sessions Baseline:  Goal status: not met and continue PRN PLAN:  SLP FREQUENCY:  once every approx 4 weeks  SLP DURATION:  7 sessions  PLANNED INTERVENTIONS: Aspiration precaution training, Pharyngeal strengthening exercises, Diet toleration management , Trials of upgraded texture/liquids, SLP instruction and feedback, Compensatory strategies, and Patient/family education    [facility], CCC-SLP 04/24/2023, 1:08 PM

## 2023-04-24 NOTE — Patient Instructions (Signed)
  TAKE SMALLER BITES AND SIPS AND CHEW VERY WELL, especially with those foods that haven't gone done smoothly   EAT SLOWLY  CLEAR YOUR THROAT EVERY NOW AND AGAIN WHEN YOU DRINK   ==================================== Signs of Aspiration Pneumonia   Chest pain/tightness Fever (can be low grade) Cough  With foul-smelling phlegm (sputum) With sputum containing pus or blood With greenish sputum Fatigue  Shortness of breath  Wheezing   **IF YOU HAVE THESE SIGNS, CONTACT YOUR DOCTOR OR GO TO THE EMERGENCY DEPARTMENT OR URGENT CARE AS SOON AS POSSIBLE**

## 2023-04-25 ENCOUNTER — Inpatient Hospital Stay: Payer: Medicare HMO

## 2023-04-25 ENCOUNTER — Inpatient Hospital Stay: Payer: Medicare HMO | Attending: Hematology

## 2023-04-25 ENCOUNTER — Inpatient Hospital Stay: Payer: Medicare HMO | Attending: Hematology | Admitting: Hematology

## 2023-04-25 VITALS — BP 138/84 | HR 90 | Temp 97.7°F | Resp 17 | Ht 72.0 in | Wt 267.1 lb

## 2023-04-25 DIAGNOSIS — Z7189 Other specified counseling: Secondary | ICD-10-CM

## 2023-04-25 DIAGNOSIS — Z95828 Presence of other vascular implants and grafts: Secondary | ICD-10-CM

## 2023-04-25 DIAGNOSIS — Z5112 Encounter for antineoplastic immunotherapy: Secondary | ICD-10-CM | POA: Diagnosis not present

## 2023-04-25 DIAGNOSIS — C320 Malignant neoplasm of glottis: Secondary | ICD-10-CM | POA: Diagnosis not present

## 2023-04-25 DIAGNOSIS — C760 Malignant neoplasm of head, face and neck: Secondary | ICD-10-CM

## 2023-04-25 DIAGNOSIS — Z5111 Encounter for antineoplastic chemotherapy: Secondary | ICD-10-CM | POA: Diagnosis not present

## 2023-04-25 DIAGNOSIS — Z79899 Other long term (current) drug therapy: Secondary | ICD-10-CM | POA: Insufficient documentation

## 2023-04-25 DIAGNOSIS — Z760 Encounter for issue of repeat prescription: Secondary | ICD-10-CM

## 2023-04-25 LAB — CMP (CANCER CENTER ONLY)
ALT: 9 U/L (ref 0–44)
AST: 13 U/L — ABNORMAL LOW (ref 15–41)
Albumin: 4.1 g/dL (ref 3.5–5.0)
Alkaline Phosphatase: 80 U/L (ref 38–126)
Anion gap: 5 (ref 5–15)
BUN: 12 mg/dL (ref 8–23)
CO2: 31 mmol/L (ref 22–32)
Calcium: 9.6 mg/dL (ref 8.9–10.3)
Chloride: 101 mmol/L (ref 98–111)
Creatinine: 1.11 mg/dL (ref 0.61–1.24)
GFR, Estimated: >60 mL/min (ref >60)
Glucose, Bld: 149 mg/dL — ABNORMAL HIGH (ref 70–99)
Potassium: 4.1 mmol/L (ref 3.5–5.1)
Sodium: 137 mmol/L (ref 135–145)
Total Bilirubin: 0.5 mg/dL (ref <1.2)
Total Protein: 7.5 g/dL (ref 6.5–8.1)

## 2023-04-25 LAB — TSH: TSH: 3.053 u[IU]/mL (ref 0.350–4.500)

## 2023-04-25 LAB — CBC WITH DIFFERENTIAL (CANCER CENTER ONLY)
Abs Immature Granulocytes: 0.02 10*3/uL (ref 0.00–0.07)
Basophils Absolute: 0 10*3/uL (ref 0.0–0.1)
Basophils Relative: 1 %
Eosinophils Absolute: 0.1 10*3/uL (ref 0.0–0.5)
Eosinophils Relative: 1 %
HCT: 31.7 % — ABNORMAL LOW (ref 39.0–52.0)
Hemoglobin: 10 g/dL — ABNORMAL LOW (ref 13.0–17.0)
Immature Granulocytes: 0 %
Lymphocytes Relative: 28 %
Lymphs Abs: 1.5 10*3/uL (ref 0.7–4.0)
MCH: 27.5 pg (ref 26.0–34.0)
MCHC: 31.5 g/dL (ref 30.0–36.0)
MCV: 87.1 fL (ref 80.0–100.0)
Monocytes Absolute: 0.6 10*3/uL (ref 0.1–1.0)
Monocytes Relative: 10 %
Neutro Abs: 3.4 10*3/uL (ref 1.7–7.7)
Neutrophils Relative %: 60 %
Platelet Count: 372 10*3/uL (ref 150–400)
RBC: 3.64 MIL/uL — ABNORMAL LOW (ref 4.22–5.81)
RDW: 13.3 % (ref 11.5–15.5)
WBC Count: 5.6 10*3/uL (ref 4.0–10.5)
nRBC: 0 % (ref 0.0–0.2)

## 2023-04-25 MED ORDER — SODIUM CHLORIDE 0.9 % IV SOLN: Freq: Once | INTRAVENOUS | Status: AC

## 2023-04-25 MED ORDER — SODIUM CHLORIDE 0.9 % IV SOLN
200.0000 mg | Freq: Once | INTRAVENOUS | Status: AC
  Administered 2023-04-25: 200 mg via INTRAVENOUS
  Filled 2023-04-25: qty 200

## 2023-04-25 MED ORDER — SODIUM CHLORIDE 0.9% FLUSH
10.0000 mL | INTRAVENOUS | Status: DC | PRN
  Administered 2023-04-25: 10 mL

## 2023-04-25 MED ORDER — HEPARIN SOD (PORK) LOCK FLUSH 100 UNIT/ML IV SOLN
500.0000 [IU] | Freq: Once | INTRAVENOUS | Status: AC | PRN
  Administered 2023-04-25: 500 [IU]

## 2023-04-25 MED ORDER — SODIUM CHLORIDE 0.9% FLUSH
10.0000 mL | Freq: Once | INTRAVENOUS | Status: AC
Start: 2023-04-25 — End: 2023-04-25
  Administered 2023-04-25: 10 mL

## 2023-04-25 NOTE — Progress Notes (Signed)
Patient seen by Dr. Kale  Vitals are within treatment parameters.  Labs reviewed: and are within treatment parameters.  Per physician team, patient is ready for treatment and there are NO modifications to the treatment plan.  

## 2023-04-25 NOTE — Progress Notes (Signed)
[facility]   [phone] [phone]   CLINIC Notes  Date of Service:  04/25/23  Patient Care Team: Noberto Retort, MD as PCP - General (Family Medicine) Wendall Stade, MD as PCP - Cardiology (Cardiology) Lonie Peak, MD as Consulting Physician (Radiation Oncology) Malmfelt, Lise Auer, RN as Oncology Nurse Navigator Osborn Coho, MD (Inactive) as Consulting Physician (Otolaryngology) Johney Maine, MD as Attending Physician (Hematology)    CHIEF COMPLAINTS/PURPOSE OF CONSULTATION:  F/u for continued evaluation and 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  HISTORY OF PRESENTING ILLNESS:   Jesus [name] 67 y.o. male is here because of left lower extremity edema and lymphadenopathy.  The patient was seen in the emergency room this past Friday for the same issue.  A CT of the abdomen and pelvis was performed showing bulky left inguinal, left hemipelvic, and retroperitoneal adenopathy.  He was referred to [place] from the emergency room for further evaluation.  Doppler ultrasound of the left lower extremity was performed and was negative for DVT. Patient reports that he has been having left lower extremity edema in his left groin and left leg for approximately 1 month.  He states that the swelling in the left groin started to get better but then worsened.  The left lower extremity edema has slowly worsened over time.  Patient denies having fevers and chills.  He reports that he does have night sweats at times.  He reported having [place] approximate 1 [place]  He thinks his headaches are related to not having his blood pressure medication.  His headaches have now resolved.  He denies visual changes.  The patient denies chest pain, shortness of breath and cough.  No nausea, vomiting, constipation, diarrhea.  Denies abdominal pain.  The patient denies recent weight loss and has actually gained weight  recently.  Patient denies epistaxis, bleeding gums, hemoptysis, hematuria, but occasionally, and melena.  He reports increased urinary frequency over the past month but no dysuria.  The patient is here for evaluation and discussion of his recent CT and lab findings.  Interval History:  Jesus [name] 67 y.o. male who is here for continued evaluation and management of newly noted metastatic laryngeal squamous cell carcinoma. He is here to start cycle 13 day 1 of Pembrolizumab.  Patient was last seen by me on 04/03/2023 and he complained of cough, throat irritation with certain food such as potato chips, neck soreness, and occasional foggy/blurry vision.   Patient notes he has been doing well overall since our last visit. He notes that his energy levels have significantly improved. He still complains of throat irritation. He was been following-up with his Speech specialist.   He denies any new infection issues, fever, chills, night sweats, unexpected weight loss, back pain, chest pain, or leg swelling.   He has been exercising regularly and has been eating well.   Patient has been tolerating his Pembrolizumab treatment well without any new or severe toxicities.   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- last radiation 06-2018   Cataract    removed both eyes with l;ens implants    COPD (chronic obstructive pulmonary disease) (  HCC)    Family history of colon cancer    in his brother- dx'd age 28    History of chemotherapy    last 06-01-2018   History of kidney stones    History of radiation therapy    last radiation 06-2018   Hyperlipidemia    currently under control   Hypertension    Irregular heart beats    Lymphadenopathy    Pain, lower leg    Bilateral   Peripheral  arterial disease (HCC)    Pre-diabetes    Red-green color blindness    RLS (restless legs syndrome)    Snores    Wears glasses     SURGICAL HISTORY: Past Surgical History:  Procedure Laterality Date   CATARACT EXTRACTION W/ INTRAOCULAR LENS  IMPLANT, BILATERAL     COLONOSCOPY     DIRECT LARYNGOSCOPY Right 02/03/2022   Procedure: DIRECT LARYNGOSCOPY WITH BIOPSY OF RIGHT FALSE VOCAL CORD;  Surgeon: Osborn Coho, MD;  Location: [facility] OR;  Service: ENT;  Laterality: Right;   dislodged salava stone     FRACTURE SURGERY     HAND ARTHROPLASTY  1995   crushed left hand   INGUINAL LYMPH NODE BIOPSY Left 01/02/2018   Procedure: LEFT INGUINAL LYMPH NODE BIOPSY;  Surgeon: Emelia Loron, MD;  Location: [facility] OR;  Service: General;  Laterality: Left;   IR IMAGING GUIDED PORT INSERTION  01/15/2018   IR IMAGING GUIDED PORT INSERTION  08/10/2022   IR REMOVAL TUN ACCESS W/ PORT W/O FL MOD SED  03/11/2019   MEDIASTINOSCOPY N/A 07/22/2022   Procedure: MEDIASTINOSCOPY;  Surgeon: Loreli Slot, MD;  Location: [facility] OR;  Service: Thoracic;  Laterality: N/A;   MICROLARYNGOSCOPY Left 01/17/2014   Procedure: MICROLARYNGOSCOPY WITH EXCISION OF THE BIOPSY OF LEFT VOCAL CORD LESION;  Surgeon: Serena Colonel, MD;  Location: [facility];  Service: ENT;  Laterality: Left;   MICROLARYNGOSCOPY N/A 09/16/2020   Procedure: MICROLARYNGOSCOPY with Biopsy of vocal cord lesion;  Surgeon: Osborn Coho, MD;  Location: [facility] OR;  Service: ENT;  Laterality: N/A;   ORIF FOOT FRACTURE  2005   left   REFRACTIVE SURGERY Right    removed cloudiness in right eye after cataract removal     SOCIAL HISTORY: Social History   Socioeconomic History   Marital status: Divorced    Spouse name: Not on file   Number of children: 3   Years of education: Not on file   Highest education level: Not on file  Occupational History   Not on file  Tobacco Use   Smoking status: Former    Current packs/day: 0.00    Average  packs/day: 0.5 packs/day for 36.0 years (18.0 ttl pk-yrs)    Types: Cigarettes    Start date: 09/28/1984    Quit date: 09/28/2020    Years since quitting: 2.5   Smokeless tobacco: Never   Tobacco comments:    he denies smoking in about 2 weeks 07/13/18  Vaping Use   Vaping status: Never Used  Substance and Sexual Activity   Alcohol use: Yes    Alcohol/week: 6.0 standard drinks of alcohol    Types: 6 Cans of beer per week    Comment: Last use: Friday 30th   Drug use: Yes    Types: Marijuana, Cocaine    Comment: Last [facility] Use: Friday 30th.   Sexual activity: Not Currently  Other Topics Concern   Not on file  Social History Narrative   Not on file   Social  Drivers of Health   Financial Resource Strain: Low Risk  (10/06/2020)   Overall Financial Resource Strain (CARDIA)    Difficulty of Paying Living Expenses: Not very hard  Food Insecurity: No Food Insecurity (11/09/2022)   Hunger Vital Sign    Worried About Running Out of Food in the Last Year: Never true    Ran Out of Food in the Last Year: Never true  Transportation Needs: No Transportation Needs (11/09/2022)   PRAPARE - Administrator, Civil Service (Medical): No    Lack of Transportation (Non-Medical): No  Physical Activity: Not on file  Stress: Not on file  Social Connections: Moderately Integrated (10/06/2020)   Social Connection and Isolation Panel [NHANES]    Frequency of Communication with Friends and Family: Three times a week    Frequency of Social Gatherings with Friends and Family: Three times a week    Attends Religious Services: 1 to 4 times per year    Active Member of Clubs or Organizations: Yes    Attends Banker Meetings: 1 to 4 times per year    Marital Status: Divorced  Intimate Partner Violence: Not At Risk (11/09/2022)   Humiliation, Afraid, Rape, and Kick questionnaire    Fear of Current or Ex-Partner: No    Emotionally Abused: No    Physically Abused: No    Sexually Abused: No     FAMILY HISTORY: Family History  Problem Relation Age of Onset   Breast cancer Mother    Diabetes Father    Hypertension Father    Stroke Father    Mental illness Sister    Hypertension Daughter    Mental illness Daughter    Hypertension Brother    Colon cancer Brother 77       passed away 12/13/18   Breast cancer Sister    Esophageal cancer Neg Hx    Colon polyps Neg Hx    Rectal cancer Neg Hx    Stomach cancer Neg Hx     ALLERGIES:  is allergic to bee venom, lisinopril, antifungal [miconazole nitrate], and zolpidem tartrate er.  MEDICATIONS:  Current Outpatient Medications  Medication Sig Dispense Refill   ACCU-CHEK GUIDE test strip 1 each by Other route as needed for other.     Accu-Chek Softclix Lancets lancets      acetaminophen (TYLENOL) 500 MG tablet Take 1,500 mg by mouth daily as needed for moderate pain.     amLODipine (NORVASC) 10 MG tablet TAKE 1 TABLET BY MOUTH EVERY DAY (Patient not taking: Reported on 01/11/2023) 30 tablet 0   aspirin EC 81 MG tablet Take 81 mg by mouth every evening. Swallow whole.     b complex vitamins capsule Take 1 capsule by mouth daily.     HYDROcodone-acetaminophen (HYCET) 7.5-325 mg/15 ml solution Take 10-15 mLs by mouth 4 (four) times daily as needed for moderate pain. Take with food. 300 mL 0   lidocaine (XYLOCAINE) 2 % solution Patient: Mix 1part 2% viscous lidocaine, 1part H20. Swallow 10mL of diluted mixture, before meals and at bedtime, up to QID to help sore throat 200 mL 3   lidocaine-prilocaine (EMLA) cream Apply to affected area once 30 g 3   lisinopril (ZESTRIL) 20 MG tablet TAKE 1 TABLET BY MOUTH EVERY DAY (Patient taking differently: Take 20 mg by mouth every evening.) 30 tablet 0   magic mouthwash w/lidocaine SOLN Take 5 mLs by mouth 4 (four) times daily as needed for mouth pain. Compound 400 mL  with the following 80 ML of distilled water 80 mL of Maalox 80 mL of nystatin 500,000 units per 5 mL 80 mL of 2% viscous  lidocaine 80 mL of Benadryl 12.5mg  per 5 mL 400 mL 0   metFORMIN (GLUCOPHAGE) 500 MG tablet Take 500 mg by mouth 2 (two) times daily with a meal.     Multiple Vitamins-Minerals (MULTI ADULT GUMMIES PO) Take 1 tablet by mouth in the morning. Centrum     ondansetron (ZOFRAN) 8 MG tablet Take 1 tablet (8 mg total) by mouth every 8 (eight) hours as needed for nausea or vomiting. Start on the third day after carboplatin. 30 tablet 1   oxyCODONE (OXY IR/ROXICODONE) 5 MG immediate release tablet Take 1 tablet (5 mg total) by mouth every 6 (six) hours as needed for moderate pain or breakthrough pain. 20 tablet 0   prochlorperazine (COMPAZINE) 10 MG tablet Take 1 tablet (10 mg total) by mouth every 6 (six) hours as needed for nausea or vomiting. 30 tablet 1   sucralfate (CARAFATE) 1 g tablet Take 1 tablet (1 g total) by mouth 2 (two) times daily. 60 tablet 0   tadalafil (CIALIS) 20 MG tablet Take 20 mg by mouth daily as needed for erectile dysfunction.     No current facility-administered medications for this visit.    REVIEW OF SYSTEMS:   10 Point review of Systems was done is negative except as noted above.  PHYSICAL EXAMINATION: .BP 138/84 (BP Location: Left Arm, Patient Position: Sitting)   Pulse 90   Temp 97.7 F (36.5 C) (Temporal)   Resp 17   Ht 6' (1.829 m)   Wt 267 lb 1.6 oz (121.2 kg)   SpO2 100%   BMI 36.23 kg/m  . GENERAL:alert, in no acute distress and comfortable SKIN: no acute rashes, no significant lesions EYES: conjunctiva are pink and non-injected, sclera anicteric OROPHARYNX: MMM, no exudates, no oropharyngeal erythema or ulceration NECK: supple, no JVD LYMPH:  no palpable lymphadenopathy in the cervical, axillary or inguinal regions LUNGS: clear to auscultation b/l with normal respiratory effort HEART: regular rate & rhythm ABDOMEN:  normoactive bowel sounds , non tender, not distended. Extremity: no pedal edema PSYCH: alert & oriented x 3 with fluent speech NEURO:  no focal motor/sensory deficits   LABORATORY DATA:   I have reviewed the data as listed    Latest Ref Rng & Units 04/25/2023   11:24 AM 04/03/2023   11:56 AM 03/13/2023    1:24 PM  CBC  WBC 4.0 - 10.5 K/uL 5.6  6.5  5.0   Hemoglobin 13.0 - 17.0 g/dL 16.1  09.6  04.5   Hematocrit 39.0 - 52.0 % 31.7  32.3  31.3   Platelets 150 - 400 K/uL 372  319  327    CBC    Component Value Date/Time   WBC 5.6 04/25/2023 1124   WBC 6.5 07/19/2022 1400   RBC 3.64 (L) 04/25/2023 1124   HGB 10.0 (L) 04/25/2023 1124   HCT 31.7 (L) 04/25/2023 1124   PLT 372 04/25/2023 1124   MCV 87.1 04/25/2023 1124   MCV 87.3 12/22/2017 1518   MCH 27.5 04/25/2023 1124   MCHC 31.5 04/25/2023 1124   RDW 13.3 04/25/2023 1124   LYMPHSABS 1.5 04/25/2023 1124   MONOABS 0.6 04/25/2023 1124   EOSABS 0.1 04/25/2023 1124   BASOSABS 0.0 04/25/2023 1124       Latest Ref Rng & Units 04/25/2023   11:24 AM 04/03/2023   11:56  AM 03/13/2023    1:24 PM  CMP  Glucose 70 - 99 mg/dL 119  147  829   BUN 8 - 23 mg/dL 12  15  19    Creatinine 0.61 - 1.24 mg/dL 5.62  1.30  8.65   Sodium 135 - 145 mmol/L 137  139  136   Potassium 3.5 - 5.1 mmol/L 4.1  4.2  4.3   Chloride 98 - 111 mmol/L 101  105  104   CO2 22 - 32 mmol/L 31  30  28    Calcium 8.9 - 10.3 mg/dL 9.6  9.6  9.5   Total Protein 6.5 - 8.1 g/dL 7.5  7.7  7.5   Total Bilirubin <1.2 mg/dL 0.5  0.3  0.3   Alkaline Phos 38 - 126 U/L 80  83  88   AST 15 - 41 U/L 13  11  11    ALT 0 - 44 U/L 9  7  8     . Lab Results  Component Value Date   LDH 168 05/18/2022   Tsh 1.42 (05/21/2021)   01/02/18 Left Inguinal LN Bx:   12/26/17 Tissue Flow Cytometry:   12/26/17 Inguinal Core biopsy:      RADIOGRAPHIC STUDIES: I have personally reviewed the radiological images as listed and agreed with the findings in the report. NM PET Image Restag (PS) Skull Base To Thigh Result Date: 04/18/2023 CLINICAL DATA:  Subsequent treatment strategy for head and neck cancer.  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 EXAM: NUCLEAR MEDICINE PET SKULL BASE TO THIGH TECHNIQUE: 13.67 mCi F-18 FDG was injected intravenously. Full-ring PET imaging was performed from the skull base to thigh after the radiotracer. CT data was obtained and used for attenuation correction and anatomic localization. Fasting blood glucose: 128 mg/dl COMPARISON:  PET-CT 78/46/9629 and 06/08/2022. FINDINGS: Mediastinal blood pool activity: SUV max 2.6 NECK: The previously demonstrated hypermetabolic and enlarged level II right cervical lymph node has decreased in size and hypermetabolic activity. It is not well defined on the CT images, although is significantly smaller with an SUV max 5.8 (previously 11.1). No other enlarged or hypermetabolic cervical lymph nodes are identified. There is increased focal activity along the arytenoid cartilage on the right (SUV max 8.4, previously 4.8) present no clear corresponding abnormality on the CT images, and this could reflect treatment change. No other suspicious activity within the pharyngeal mucosal space. Incidental CT findings: none CHEST: Previously demonstrated left paratracheal mass just inferior to the thoracic inlet is ill-defined on the CT images, measuring approximately 3.8 x 2.4 cm (3.8 x 2.2 cm previously). On the PET images, this demonstrates peripheral hypermetabolic activity with an SV max of 10.2 (previously 10.6). Based on the PET images, this lesion appears slightly larger overall, although does demonstrate some central necrosis consistent with response to therapy. Stable low level activity within a right paratracheal node measuring 8 mm on image 66/4 (SUV max 5.0, previously 4.7). No other hypermetabolic thoracic lymph nodes. No hypermetabolic pulmonary activity or suspicious nodularity. Incidental CT findings: Stable tracheal deviation to the right by the left paratracheal mass spread right  IJ Port-A-Cath extends to the superior cavoatrial junction. ABDOMEN/PELVIS: There is no hypermetabolic activity within the liver, adrenal glands, spleen or pancreas. There is no hypermetabolic nodal activity in the abdomen or pelvis. Previously demonstrated prominent anorectal activity appears improved, and no highly suspicious bowel activity is currently demonstrated. Incidental CT findings: Unchanged bilateral renal cysts for which no  specific follow-up imaging is recommended. Mild aortoiliac atherosclerosis. SKELETON: There is no hypermetabolic activity to suggest osseous metastatic disease. Incidental CT findings: Stable lumbar spondylosis and generalized trabecular thickening in the iliac bones bilaterally. IMPRESSION: 1. Interval significant decrease in size and hypermetabolic activity of the previously demonstrated right level II cervical nodal metastasis. 2. The previously demonstrated left paratracheal mass appears slightly larger overall, although does demonstrate some central necrosis consistent with response to therapy. 3. Increased focal activity along the right arytenoid cartilage without clear corresponding abnormality on the CT images, possibly treatment related. Correlate clinically. 4. Stable low level activity within a right paratracheal lymph node. 5. No evidence of metastatic disease in the abdomen, pelvis or skeleton. 6.  Aortic Atherosclerosis (ICD10-I70.0). Electronically Signed   By: Carey Bullocks M.D.   On: 04/18/2023 09:02    ASSESSMENT & PLAN:   This is a pleasant 67 y.o. 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   CT of the abdomen and pelvis performed on 12/22/2017 showed bulky left inguinal, left hemipelvic, and   retroperitoneal adenopathy.     01/02/18 Left inguinal LN Biopsy revealed T-Cell/histocyte rich Large B-Cell Lymphoma   12/27/17 ECHO revealed LV EF of 55-60%    01/05/18 PET/CT revealed Massively enlarged pelvic lymph nodes intense metabolic activity consistent lymphoma. 2. Additional hypermetabolic lymph nodes in the porta hepatis and retroperitoneum LEFT aorta. 3. Solitary hypermetabolic mediastinal lymph node in the upper LEFT Mediastinum. 4. 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 5. Normal spleen.     HIV non reactive on 12/22/2017.  Hep C and hep B serology negative.   03/14/18 PET/CT revealed PET-CT findings suggest an excellent response to chemotherapy. The abdominal lymphadenopathy has near completely resolved and demonstrates a near complete metabolic response. The pelvic and inguinal adenopathy has significantly decreased in size and the metabolic activity has significantly decreased. 2. Diffuse marrow activity likely due to chemotherapy and or marrow stimulating drugs. I do not see any discrete persistent lesions.    04/17/18 CT Head revealed Subtle mesial caudothalamic hypodensities may be artifact though, the could reflect encephalitis or Wernicke's encephalopathy. Consider MRI of the head with and without contrast. 2. Mild chronic small vessel ischemic changes    S/p 6 cycles of EPOCH-R completed on 06/01/18  06/28/18 PET/CT revealed Continued good response to treatment. No residual measurable or hypermetabolic abdominal lymphadenopathy and no recurrent osseous disease. 2. Interval decrease in size of the left operator region lymph node and also the left inguinal lymph node. However, the both have small foci of slightly increased hypermetabolism which bears surveillance. 3. No new or progressive lymphadenopathy in the neck, chest, abdomen or pelvis.  S/p 39.6 Gy in 22 fractions between 07/25/18 and 08/23/18  02/13/2019 CT C/A/P ([id number])  ([id number]) revealed "1. Response to therapy of pelvic adenopathy compared to the PET of 06/28/2018. 2. No new or progressive disease. 3. Mild prostatomegaly. 4. 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 5. Hepatomegaly."   2) left lower extremity swelling- now resolved  Doppler ultrasound for DVT was negative in the left lower extremity.   Likely from venous compression +/- lymphatic obstruction from bulky left inguinal, left hemipelvic, and  retroperitoneal adenopathy.    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  6) Radiation mucositis  PLAN: -Discussed lab results from today, 04/25/2023, in detail with the patient. CBC shows patient is anemic with hemoglobin of 10.0 g/dL with hematocrit of 16.1%. Cmp pending.  -Discussed PET scan result from 04/14/2023. Shows Interval significant decrease in size and hypermetabolic activity of the previously demonstrated right level II cervical nodal metastasis. The previously demonstrated left paratracheal mass appears slightly larger overall, although does demonstrate some central necrosis consistent with response to therapy. Increased focal activity along the right arytenoid cartilage without clear corresponding abnormality on the CT images, possibly treatment related. Correlate clinically.  Stable low level activity within a right paratracheal lymph node. No evidence of metastatic disease in the abdomen, pelvis or skeleton. -Recommend influenza vaccine, COVID-19 Booster, and other age related vaccines.   -Patient has been tolerating his  treatment well without any new or severe toxicities.  -Patient will continue to receive immunotherapy (Pembrolizumab) without any dose modification.  -Advised patient to eat well and stay well hydrated.   FOLLOW-UP: Plz schedule next 6 pembrolizumab treatments per integrated scheduling.  The total time spent in the appointment was 30 minutes* .  All of the patient's questions were answered with apparent satisfaction. The patient knows to call the clinic with any problems, questions or concerns.   Wyvonnia Lora MD MS AAHIVMS [facility] Hematology/Oncology Physician [facility]  .*Total Encounter Time as defined by the Centers for Medicare and Medicaid Services includes, in addition to the face-to-face time of a patient visit (documented in the note above) non-face-to-face time: obtaining and reviewing outside history, ordering and reviewing medications, tests or procedures, care coordination (communications with other health care professionals or caregivers) and documentation in the medical record.   I,Param Shah,acting as a Neurosurgeon for Wyvonnia Lora, MD.,have documented all relevant documentation on the behalf of Wyvonnia Lora, MD,as directed by  Wyvonnia Lora, MD while in the presence of Wyvonnia Lora, MD.   .I have reviewed the above documentation for accuracy and completeness, and I agree with the above. Johney Maine MD

## 2023-04-25 NOTE — Patient Instructions (Signed)
 [facility] OF [facility]  Discharge Instructions: Thank you for choosing [facility] to provide your oncology and hematology care.   If you have a lab appointment with the Cancer Center, please go directly to the Cancer Center and check in at the registration area.   Wear comfortable clothing and clothing appropriate for easy access to any Portacath or PICC line.   We strive to give you quality time with your provider. You may need to reschedule your appointment if you arrive late (15 or more minutes).  Arriving late affects you and other patients whose appointments are after yours.  Also, if you miss three or more appointments without notifying the office, you may be dismissed from the clinic at the provider's discretion.      For prescription refill requests, have your pharmacy contact our office and allow 72 hours for refills to be completed.    Today you received the following chemotherapy and/or immunotherapy agents Jesus [name]      To help prevent nausea and vomiting after your treatment, we encourage you to take your nausea medication as directed.  BELOW ARE SYMPTOMS THAT SHOULD BE REPORTED IMMEDIATELY: *FEVER GREATER THAN 100.4 F (38 C) OR HIGHER *CHILLS OR SWEATING *NAUSEA AND VOMITING THAT IS NOT CONTROLLED WITH YOUR NAUSEA MEDICATION *UNUSUAL SHORTNESS OF BREATH *UNUSUAL BRUISING OR BLEEDING *URINARY PROBLEMS (pain or burning when urinating, or frequent urination) *BOWEL PROBLEMS (unusual diarrhea, constipation, pain near the anus) TENDERNESS IN MOUTH AND THROAT WITH OR WITHOUT PRESENCE OF ULCERS (sore throat, sores in mouth, or a toothache) UNUSUAL RASH, SWELLING OR PAIN  UNUSUAL VAGINAL DISCHARGE OR ITCHING   Items with * indicate a potential emergency and should be followed up as soon as possible or go to the Emergency Department if any problems should occur.  Please show the CHEMOTHERAPY ALERT CARD or IMMUNOTHERAPY  ALERT CARD at check-in to the Emergency Department and triage nurse.  Should you have questions after your visit or need to cancel or reschedule your appointment, please contact [facility] - A DEPT [facility]  Dept: [phone]  and follow the prompts.  Office hours are 8:00 a.m. to 4:30 p.m. Monday - Friday. Please note that voicemails left after 4:00 p.m. may not be returned until the following business day.  We are closed weekends and major holidays. You have access to a nurse at all times for urgent questions. Please call the main number to the clinic Dept: [phone] and follow the prompts.   For any non-urgent questions, you may also contact your provider using MyChart. We now offer e-Visits for anyone 41 and older to request care online for non-urgent symptoms. For details visit [email or web address].   Also download the MyChart app! Go to the app store, search "MyChart", open the app, select [place], and log in with your MyChart username and password.

## 2023-04-26 LAB — T4: T4, Total: 8.2 ug/dL (ref 4.5–12.0)

## 2023-04-28 ENCOUNTER — Other Ambulatory Visit: Payer: Self-pay

## 2023-05-01 ENCOUNTER — Encounter: Payer: Self-pay | Admitting: Hematology

## 2023-05-11 ENCOUNTER — Encounter: Payer: Self-pay | Admitting: Hematology

## 2023-05-12 ENCOUNTER — Encounter: Payer: Self-pay | Admitting: Hematology

## 2023-05-15 ENCOUNTER — Inpatient Hospital Stay: Payer: PPO | Attending: Hematology

## 2023-05-15 ENCOUNTER — Inpatient Hospital Stay: Payer: PPO

## 2023-05-15 ENCOUNTER — Inpatient Hospital Stay (HOSPITAL_BASED_OUTPATIENT_CLINIC_OR_DEPARTMENT_OTHER): Payer: PPO | Admitting: Hematology

## 2023-05-15 VITALS — BP 120/73 | HR 54 | Temp 97.7°F | Resp 16 | Ht 72.0 in | Wt 257.8 lb

## 2023-05-15 DIAGNOSIS — Z79899 Other long term (current) drug therapy: Secondary | ICD-10-CM | POA: Insufficient documentation

## 2023-05-15 DIAGNOSIS — Z95828 Presence of other vascular implants and grafts: Secondary | ICD-10-CM

## 2023-05-15 DIAGNOSIS — D649 Anemia, unspecified: Secondary | ICD-10-CM | POA: Insufficient documentation

## 2023-05-15 DIAGNOSIS — Z87891 Personal history of nicotine dependence: Secondary | ICD-10-CM | POA: Insufficient documentation

## 2023-05-15 DIAGNOSIS — R1312 Dysphagia, oropharyngeal phase: Secondary | ICD-10-CM | POA: Diagnosis not present

## 2023-05-15 DIAGNOSIS — Z7189 Other specified counseling: Secondary | ICD-10-CM

## 2023-05-15 DIAGNOSIS — C320 Malignant neoplasm of glottis: Secondary | ICD-10-CM | POA: Insufficient documentation

## 2023-05-15 DIAGNOSIS — Z5112 Encounter for antineoplastic immunotherapy: Secondary | ICD-10-CM

## 2023-05-15 DIAGNOSIS — C760 Malignant neoplasm of head, face and neck: Secondary | ICD-10-CM | POA: Diagnosis not present

## 2023-05-15 LAB — CMP (CANCER CENTER ONLY)
ALT: 7 U/L (ref 0–44)
AST: 12 U/L — ABNORMAL LOW (ref 15–41)
Albumin: 4 g/dL (ref 3.5–5.0)
Alkaline Phosphatase: 88 U/L (ref 38–126)
Anion gap: 7 (ref 5–15)
BUN: 22 mg/dL (ref 8–23)
CO2: 29 mmol/L (ref 22–32)
Calcium: 9.4 mg/dL (ref 8.9–10.3)
Chloride: 99 mmol/L (ref 98–111)
Creatinine: 1.22 mg/dL (ref 0.61–1.24)
GFR, Estimated: >60 mL/min (ref >60)
Glucose, Bld: 131 mg/dL — ABNORMAL HIGH (ref 70–99)
Potassium: 4 mmol/L (ref 3.5–5.1)
Sodium: 135 mmol/L (ref 135–145)
Total Bilirubin: 0.4 mg/dL (ref 0.0–1.2)
Total Protein: 8.2 g/dL — ABNORMAL HIGH (ref 6.5–8.1)

## 2023-05-15 LAB — CBC WITH DIFFERENTIAL (CANCER CENTER ONLY)
Abs Immature Granulocytes: 0.02 10*3/uL (ref 0.00–0.07)
Basophils Absolute: 0 10*3/uL (ref 0.0–0.1)
Basophils Relative: 1 %
Eosinophils Absolute: 0.1 10*3/uL (ref 0.0–0.5)
Eosinophils Relative: 1 %
HCT: 30.5 % — ABNORMAL LOW (ref 39.0–52.0)
Hemoglobin: 10.1 g/dL — ABNORMAL LOW (ref 13.0–17.0)
Immature Granulocytes: 0 %
Lymphocytes Relative: 22 %
Lymphs Abs: 1.8 10*3/uL (ref 0.7–4.0)
MCH: 27.7 pg (ref 26.0–34.0)
MCHC: 33.1 g/dL (ref 30.0–36.0)
MCV: 83.8 fL (ref 80.0–100.0)
Monocytes Absolute: 0.6 10*3/uL (ref 0.1–1.0)
Monocytes Relative: 8 %
Neutro Abs: 5.4 10*3/uL (ref 1.7–7.7)
Neutrophils Relative %: 68 %
Platelet Count: 288 10*3/uL (ref 150–400)
RBC: 3.64 MIL/uL — ABNORMAL LOW (ref 4.22–5.81)
RDW: 13.3 % (ref 11.5–15.5)
Smear Review: NORMAL
WBC Count: 7.9 10*3/uL (ref 4.0–10.5)
nRBC: 0 % (ref 0.0–0.2)

## 2023-05-15 LAB — TSH: TSH: 2.914 u[IU]/mL (ref 0.350–4.500)

## 2023-05-15 MED ORDER — SODIUM CHLORIDE 0.9% FLUSH
10.0000 mL | INTRAVENOUS | Status: DC | PRN
  Administered 2023-05-15: 10 mL

## 2023-05-15 MED ORDER — HEPARIN SOD (PORK) LOCK FLUSH 100 UNIT/ML IV SOLN
500.0000 [IU] | Freq: Once | INTRAVENOUS | Status: AC | PRN
  Administered 2023-05-15: 500 [IU]

## 2023-05-15 MED ORDER — SODIUM CHLORIDE 0.9 % IV SOLN
Freq: Once | INTRAVENOUS | Status: AC
Start: 2023-05-15 — End: 2023-05-15

## 2023-05-15 MED ORDER — PEMBROLIZUMAB CHEMO INJECTION 100 MG/4ML
200.0000 mg | Freq: Once | INTRAVENOUS | Status: AC
  Administered 2023-05-15: 200 mg via INTRAVENOUS
  Filled 2023-05-15: qty 200

## 2023-05-15 MED ORDER — SODIUM CHLORIDE 0.9% FLUSH
10.0000 mL | Freq: Once | INTRAVENOUS | Status: AC
  Administered 2023-05-15: 10 mL

## 2023-05-15 NOTE — Patient Instructions (Signed)
 [facility]  Discharge Instructions: Thank you for choosing [facility] to provide your oncology and hematology care.   If you have a lab appointment with the Cancer Center, please go directly to the Cancer Center and check in at the registration area.   Wear comfortable clothing and clothing appropriate for easy access to any Portacath or PICC line.   We strive to give you quality time with your provider. You may need to reschedule your appointment if you arrive late (15 or more minutes).  Arriving late affects you and other patients whose appointments are after yours.  Also, if you miss three or more appointments without notifying the office, you may be dismissed from the clinic at the provider's discretion.      For prescription refill requests, have your pharmacy contact our office and allow 72 hours for refills to be completed.    Today you received the following chemotherapy and/or immunotherapy agents: pembrolizumab      To help prevent nausea and vomiting after your treatment, we encourage you to take your nausea medication as directed.  BELOW ARE SYMPTOMS THAT SHOULD BE REPORTED IMMEDIATELY: *FEVER GREATER THAN 100.4 F (38 C) OR HIGHER *CHILLS OR SWEATING *NAUSEA AND VOMITING THAT IS NOT CONTROLLED WITH YOUR NAUSEA MEDICATION *UNUSUAL SHORTNESS OF BREATH *UNUSUAL BRUISING OR BLEEDING *URINARY PROBLEMS (pain or burning when urinating, or frequent urination) *BOWEL PROBLEMS (unusual diarrhea, constipation, pain near the anus) TENDERNESS IN MOUTH AND THROAT WITH OR WITHOUT PRESENCE OF ULCERS (sore throat, sores in mouth, or a toothache) UNUSUAL RASH, SWELLING OR PAIN  UNUSUAL VAGINAL DISCHARGE OR ITCHING   Items with * indicate a potential emergency and should be followed up as soon as possible or go to the Emergency Department if any problems should occur.  Please show the CHEMOTHERAPY ALERT CARD or  IMMUNOTHERAPY ALERT CARD at check-in to the Emergency Department and triage nurse.  Should you have questions after your visit or need to cancel or reschedule your appointment, please contact [facility] - [facility]  Dept: [phone]  and follow the prompts.  Office hours are 8:00 a.m. to 4:30 p.m. Monday - Friday. Please note that voicemails left after 4:00 p.m. may not be returned until the following business day.  We are closed weekends and major holidays. You have access to a nurse at all times for urgent questions. Please call the main number to the clinic Dept: [phone] and follow the prompts.   For any non-urgent questions, you may also contact your provider using MyChart. We now offer e-Visits for anyone 78 and older to request care online for non-urgent symptoms. For details visit [email or web address].   Also download the MyChart app! Go to the app store, search "MyChart", open the app, select [place], and log in with your MyChart username and password.

## 2023-05-15 NOTE — Progress Notes (Signed)
Patient seen by Dr. Addison Naegeli are within treatment parameters.  Labs reviewed: and are within treatment parameters./ CBC pending  Per physician team, patient is ready for treatment and there are NO modifications to the treatment plan.

## 2023-05-16 LAB — T4: T4, Total: 8.3 ug/dL (ref 4.5–12.0)

## 2023-05-17 ENCOUNTER — Other Ambulatory Visit: Payer: Self-pay

## 2023-05-19 ENCOUNTER — Ambulatory Visit
Admission: RE | Admit: 2023-05-19 | Discharge: 2023-05-19 | Disposition: A | Discharge status: Home / Self Care | Payer: PPO | Source: Ambulatory Visit | Attending: Physician Assistant | Admitting: Physician Assistant

## 2023-05-19 ENCOUNTER — Other Ambulatory Visit: Payer: Self-pay

## 2023-05-19 VITALS — BP 121/77 | HR 92 | Temp 98.3°F | Resp 18

## 2023-05-19 DIAGNOSIS — B370 Candidal stomatitis: Secondary | ICD-10-CM | POA: Diagnosis present

## 2023-05-19 DIAGNOSIS — R0981 Nasal congestion: Secondary | ICD-10-CM | POA: Insufficient documentation

## 2023-05-19 DIAGNOSIS — J029 Acute pharyngitis, unspecified: Secondary | ICD-10-CM | POA: Diagnosis present

## 2023-05-19 LAB — POCT RAPID STREP A (OFFICE): Rapid Strep A Screen: NEGATIVE

## 2023-05-19 LAB — POCT INFLUENZA A/B
Influenza A, POC: NEGATIVE
Influenza B, POC: NEGATIVE

## 2023-05-19 MED ORDER — NYSTATIN 100000 UNIT/ML MT SUSP: 500000.0000 [IU] | Freq: Four times a day (QID) | OROMUCOSAL | 0 refills | Status: DC

## 2023-05-19 NOTE — ED Triage Notes (Signed)
 Sore throat x 1 week- coughing up phlegm- Hx of CA and states this can be a side effect.  C/o ear pain also. Had RSV vaccine this summer

## 2023-05-19 NOTE — Discharge Instructions (Signed)
 You were negative for flu and for strep in clinic.  We will send this off for culture but I suspect your symptoms are more related to thrush.  I will also contact you if you are positive for COVID.  Use nystatin ; swish and swallow 4 times daily.  I also recommend gargling with warm salt water .  Follow-up with your primary care first thing next week.  If anything changes or worsens and you have increasing sore throat, fever, difficulty swallowing, swelling of your throat, shortness of breath, change in your voice you need to go to the emergency room immediately.

## 2023-05-19 NOTE — ED Provider Notes (Signed)
 [facility]    CSN: [id number] Arrival date & time: 05/19/23  1346      History   Chief Complaint Chief Complaint  Patient presents with   Sore Throat    Lots of mucus - Entered by patient    HPI Jesus [name] is a 68 y.o. male.   Patient presents today with a weeklong history of sore throat.  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  He is on Keytruda  but not currently receiving any chemotherapy.  He is working with a Human resources officer and has ongoing dysphagia but feels that there is something else going on.  He does report some congestion but denies any significant cough, fever, shortness of breath, nausea, vomiting.  He has been able to eat and drink soft foods but has difficulty with solid food intake.  Denies any recent antibiotics or steroids.  He reports sore throat pain is rated 5 on a certain pain scale, described as sharp, worse with swallowing, no alleviating factors notified.  He is not tried any over-the-counter medication for symptom management.    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- last radiation 06-2018   Cataract    removed both eyes with l;ens implants    COPD (chronic obstructive pulmonary disease) (HCC)    Family history of colon cancer    in his brother- dx'd age 87    History of chemotherapy    last 06-01-2018   History of kidney stones    History of radiation therapy    last radiation 06-2018   Hyperlipidemia    currently under control   Hypertension    Irregular heart beats    Lymphadenopathy    Pain, lower leg    Bilateral   Peripheral arterial disease (HCC)    Pre-diabetes    Red-green color blindness    RLS (restless legs syndrome)    Snores    Wears  glasses     Patient Active Problem List   Diagnosis Date Noted   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   Pain due to onychomycosis of toenail 10/25/2022   Port-A-Cath in place 09/05/2022   Head and neck cancer (HCC) 08/04/2022   Chronic pain 07/15/2021   Diabetes mellitus without complication (HCC) 07/15/2021   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   Gastroesophageal reflux disease without esophagitis 07/15/2021   History of lymphoma 07/15/2021   Lipoma of skin and subcutaneous tissue of trunk 07/15/2021   Mixed hyperlipidemia 07/15/2021   Prediabetes 07/15/2021   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   Tobacco dependence 07/15/2021   Trigger thumb of left hand 02/17/2021   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   Hoarseness 08/11/2020   Chronic pain of right knee 10/17/2018   Large cell (diffuse) non-Hodgkin's lymphoma (HCC) 05/10/2018   Bacteremia due to Escherichia coli    HTN (hypertension) 04/17/2018   RLS (restless legs syndrome) 04/17/2018   Abnormal LFTs 04/17/2018   Acute metabolic encephalopathy 04/16/2018   AKI (acute kidney injury) (HCC) 04/16/2018   Dehydration    Fever, unspecified  Sepsis (HCC)    Disorientation    Non-Hodgkin's lymphoma of skin (HCC) 04/03/2018   Hypokalemia    Hypomagnesemia    Anemia    Encounter for antineoplastic chemotherapy    At high risk of tumor lysis syndrome    Swelling of lower leg    Diffuse large B cell lymphoma (HCC) 01/15/2018   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HCC) 01/12/2018   Counseling regarding advance care planning and goals of care 01/12/2018   Bilateral leg pain 05/27/2014    Past Surgical History:  Procedure Laterality Date   CATARACT EXTRACTION W/ INTRAOCULAR LENS  IMPLANT, BILATERAL     COLONOSCOPY     DIRECT LARYNGOSCOPY Right 02/03/2022   Procedure:  DIRECT LARYNGOSCOPY WITH BIOPSY OF RIGHT FALSE VOCAL CORD;  Surgeon: Ammon Bales, MD;  Location: [facility] OR;  Service: ENT;  Laterality: Right;   dislodged salava stone     FRACTURE SURGERY     HAND ARTHROPLASTY  1995   crushed left hand   INGUINAL LYMPH NODE BIOPSY Left 01/02/2018   Procedure: LEFT INGUINAL LYMPH NODE BIOPSY;  Surgeon: Enid Harry, MD;  Location: [facility] OR;  Service: General;  Laterality: Left;   IR IMAGING GUIDED PORT INSERTION  01/15/2018   IR IMAGING GUIDED PORT INSERTION  08/10/2022   IR REMOVAL TUN ACCESS W/ PORT W/O FL MOD SED  03/11/2019   MEDIASTINOSCOPY N/A 07/22/2022   Procedure: MEDIASTINOSCOPY;  Surgeon: Zelphia Higashi, MD;  Location: [facility] OR;  Service: Thoracic;  Laterality: N/A;   MICROLARYNGOSCOPY Left 01/17/2014   Procedure: MICROLARYNGOSCOPY WITH EXCISION OF THE BIOPSY OF LEFT VOCAL CORD LESION;  Surgeon: Janita Mellow, MD;  Location:  SURGERY CENTER;  Service: ENT;  Laterality: Left;   MICROLARYNGOSCOPY N/A 09/16/2020   Procedure: MICROLARYNGOSCOPY with Biopsy of vocal cord lesion;  Surgeon: Ammon Bales, MD;  Location: [facility] OR;  Service: ENT;  Laterality: N/A;   ORIF FOOT FRACTURE  2005   left   REFRACTIVE SURGERY Right    removed cloudiness in right eye after cataract removal        Home Medications    Prior to Admission medications   Medication Sig Start Date End Date Taking? Authorizing Provider  ACCU-CHEK GUIDE test strip 1 each by Other route as needed for other. 01/05/23  Yes [provider]  Accu-Chek Softclix Lancets lancets  01/05/23  Yes [provider]  acetaminophen  (TYLENOL ) 500 MG tablet Take 1,500 mg by mouth daily as needed for moderate pain.   Yes [provider]  amLODipine  (NORVASC ) 10 MG tablet TAKE 1 TABLET BY MOUTH EVERY DAY 02/19/20  Yes Benjiman Bras, MD  aspirin  EC 81 MG tablet Take 81 mg by mouth every evening. Swallow whole.   Yes [provider]  b complex vitamins capsule  Take 1 capsule by mouth daily.   Yes [provider]  lidocaine -prilocaine  (EMLA ) cream Apply to affected area once 08/04/22  Yes Kale, Gautam Kishore, MD  metFORMIN (GLUCOPHAGE) 500 MG tablet Take 500 mg by mouth 2 (two) times daily with a meal. 01/20/22  Yes [provider]  Multiple Vitamins-Minerals (MULTI ADULT GUMMIES PO) Take 1 tablet by mouth in the morning. Centrum   Yes [provider]  nystatin  (MYCOSTATIN ) 100000 UNIT/ML suspension Take 5 mLs (500,000 Units total) by mouth 4 (four) times daily. 05/19/23  Yes Dejour Vos, Betsey Brow, PA-C  oxyCODONE  (OXY IR/ROXICODONE ) 5 MG immediate release tablet Take 1 tablet (5 mg total)  by mouth every 6 (six) hours as needed for moderate pain or breakthrough pain. 07/22/22  Yes Zelphia Higashi, MD  prochlorperazine  (COMPAZINE ) 10 MG tablet Take 1 tablet (10 mg total) by mouth every 6 (six) hours as needed for nausea or vomiting. 08/04/22  Yes Frankie Israel, MD  tadalafil  (CIALIS ) 20 MG tablet Take 20 mg by mouth daily as needed for erectile dysfunction. 07/03/20  Yes [provider]  lidocaine  (XYLOCAINE ) 2 % solution Patient: Mix 1part 2% viscous lidocaine , 1part H20. Swallow 10mL of diluted mixture, before meals and at bedtime, up to QID to help sore throat Patient not taking: Reported on 05/19/2023 12/21/22   Colie Dawes, MD  lisinopril  (ZESTRIL ) 20 MG tablet TAKE 1 TABLET BY MOUTH EVERY DAY Patient taking differently: Take 20 mg by mouth every evening. 02/19/20   Benjiman Bras, MD  magic mouthwash w/lidocaine  SOLN Take 5 mLs by mouth 4 (four) times daily as needed for mouth pain. Compound 400 mL with the following 80 ML of distilled water  80 mL of Maalox 80 mL of nystatin  500,000 units per 5 mL 80 mL of 2% viscous lidocaine  80 mL of Benadryl  12.5mg  per 5 mL 12/19/22   Frankie Israel, MD  ondansetron  (ZOFRAN ) 8 MG tablet Take 1 tablet (8 mg total) by mouth every 8 (eight) hours as needed for nausea  or vomiting. Start on the third day after carboplatin . 08/04/22   Frankie Israel, MD  sucralfate  (CARAFATE ) 1 g tablet Take 1 tablet (1 g total) by mouth 2 (two) times daily. 12/19/22   Frankie Israel, MD    Family History Family History  Problem Relation Age of Onset   Breast cancer Mother    Diabetes Father    Hypertension Father    Stroke Father    Mental illness Sister    Hypertension Daughter    Mental illness Daughter    Hypertension Brother    Colon cancer Brother 6       passed away 2018-11-23   Breast cancer Sister    Esophageal cancer Neg Hx    Colon polyps Neg Hx    Rectal cancer Neg Hx    Stomach cancer Neg Hx     Social History Social History   Tobacco Use   Smoking status: Former    Current packs/day: 0.00    Average packs/day: 0.5 packs/day for 36.0 years (18.0 ttl pk-yrs)    Types: Cigarettes    Start date: 09/28/1984    Quit date: 09/28/2020    Years since quitting: 2.6   Smokeless tobacco: Never   Tobacco comments:    he denies smoking in about 2 weeks 07/13/18  Vaping Use   Vaping status: Never Used  Substance Use Topics   Alcohol use: Not Currently    Alcohol/week: 6.0 standard drinks of alcohol    Types: 6 Cans of beer per week    Comment: Last use: Friday 30th   Drug use: Yes    Types: Marijuana, Cocaine    Comment: "Edibles"     Allergies   Bee venom, Lisinopril , Antifungal [miconazole nitrate], and Zolpidem tartrate er   Review of Systems Review of Systems  Constitutional:  Negative for activity change, appetite change, fatigue and fever.  HENT:  Positive for congestion, sore throat and trouble swallowing. Negative for sinus pressure, sneezing and voice change.   Respiratory:  Negative for cough and shortness of breath.   Cardiovascular:  Negative for chest pain.  Gastrointestinal:  Negative for abdominal pain, diarrhea, nausea and vomiting.  Neurological:  Negative for headaches.     Physical Exam Triage Vital Signs ED  Triage Vitals  Encounter Vitals Group     BP 05/19/23 1424 121/77     Systolic BP Percentile --      Diastolic BP Percentile --      Pulse Rate 05/19/23 1424 92     Resp 05/19/23 1424 18     Temp 05/19/23 1424 98.3 F (36.8 C)     Temp Source 05/19/23 1424 Oral     SpO2 05/19/23 1424 98 %     Weight --      Height --      Head Circumference --      Peak Flow --      Pain Score 05/19/23 1420 5     Pain Loc --      Pain Education --      Exclude from Growth Chart --    No data found.  Updated Vital Signs BP 121/77 (BP Location: Left Arm)   Pulse 92   Temp 98.3 F (36.8 C) (Oral)   Resp 18   SpO2 98%   Visual Acuity Right Eye Distance:   Left Eye Distance:   Bilateral Distance:    Right Eye Near:   Left Eye Near:    Bilateral Near:     Physical Exam Vitals reviewed.  Constitutional:      General: He is awake.     Appearance: Normal appearance. He is well-developed. He is not ill-appearing.     Comments: Very pleasant male appears stated age in no acute distress sitting after my exam room  HENT:     Head: Normocephalic and atraumatic.     Right Ear: Tympanic membrane, ear canal and external ear normal. Tympanic membrane is not erythematous or bulging.     Left Ear: Tympanic membrane, ear canal and external ear normal. Tympanic membrane is not erythematous or bulging.     Nose: Congestion present.     Mouth/Throat:     Pharynx: Uvula midline. Posterior oropharyngeal erythema present. No oropharyngeal exudate, uvula swelling or postnasal drip.     Tonsils: No tonsillar exudate or tonsillar abscesses. 0 on the right. 0 on the left.     Comments: White coating noted posterior tongue and on buccal mucosa that is able to be removed with a tongue depressor. Cardiovascular:     Rate and Rhythm: Normal rate and regular rhythm.     Heart sounds: Normal heart sounds, S1 normal and S2 normal. No murmur heard. Pulmonary:     Effort: Pulmonary effort is normal. No accessory  muscle usage or respiratory distress.     Breath sounds: Normal breath sounds. No stridor. No wheezing, rhonchi or rales.     Comments: Clear auscultation bilaterally Neurological:     Mental Status: He is alert.  Psychiatric:        Behavior: Behavior is cooperative.      UC Treatments / Results  Labs (all labs ordered are listed, but only abnormal results are displayed) Labs Reviewed  SARS CORONAVIRUS 2 (TAT 6-24 HRS)  CULTURE, GROUP A STREP [facility])  POCT INFLUENZA A/B  POCT RAPID STREP A (OFFICE)    EKG   Radiology No results found.  Procedures Procedures (including critical care time)  Medications Ordered in UC Medications - No data to display  Initial Impression / Assessment and Plan / UC Course  I have reviewed the triage  vital signs and the nursing notes.  Pertinent labs & imaging results that were available during my care of the patient were reviewed by me and considered in my medical decision making (see chart for details).     Patient is well-appearing, afebrile, nontoxic, nontachycardic.  I suspect symptoms are related to thrush given white plaques that were removable with a tongue depressor.  Given his history of malignancy and active treatment he was tested for strep and flu and was negative.  Will defer antibiotics until throat culture is available.  COVID testing was also obtained and is pending.  Chest x-ray was deferred and he had no adventitious lung sounds on exam, denied significant cough, had oxygen saturation of 98%.  Will treat for thrush with nystatin .  He was also encouraged to use gargling with warm salt water  for additional symptom relief.  We discussed that if his symptoms are not improving within a few days he should follow-up with his primary care.  If at any point anything worsens and he has worsening sore throat, muffled voice, swelling of his throat, shortness of breath, develops a fever he needs to go to the ER immediately to which he expressed  understanding.  Strict return precautions given.  All questions answered to patient satisfaction.  Final Clinical Impressions(s) / UC Diagnoses   Final diagnoses:  Nasal congestion  Sore throat  Thrush     Discharge Instructions      You were negative for flu and for strep in clinic.  We will send this off for culture but I suspect your symptoms are more related to thrush.  I will also contact you if you are positive for COVID.  Use nystatin ; swish and swallow 4 times daily.  I also recommend gargling with warm salt water .  Follow-up with your primary care first thing next week.  If anything changes or worsens and you have increasing sore throat, fever, difficulty swallowing, swelling of your throat, shortness of breath, change in your voice you need to go to the emergency room immediately.     ED Prescriptions     Medication Sig Dispense Auth. Provider   nystatin  (MYCOSTATIN ) 100000 UNIT/ML suspension Take 5 mLs (500,000 Units total) by mouth 4 (four) times daily. 140 mL Selene Peltzer K, PA-C      PDMP not reviewed this encounter.   Budd Cargo, PA-C 05/19/23 1525

## 2023-05-21 ENCOUNTER — Encounter: Payer: Self-pay | Admitting: Hematology

## 2023-05-21 LAB — SARS CORONAVIRUS 2 (TAT 6-24 HRS): SARS Coronavirus 2: NEGATIVE

## 2023-05-21 NOTE — Progress Notes (Signed)
 [facility]   [phone] [phone]   CLINIC Notes  Date of Service:  .05/15/2023  Patient Care Team: [name], MD as PCP - General (Family Medicine) Loyde Rule, MD as PCP - Cardiology (Cardiology) Colie Dawes, MD as Consulting Physician (Radiation Oncology) Malmfelt, Nancyann Aye, RN as Oncology Nurse Navigator Ammon Bales, MD (Inactive) as Consulting Physician (Otolaryngology) Frankie Israel, MD as Attending Physician (Hematology)    CHIEF COMPLAINTS/PURPOSE OF CONSULTATION:  F/u for continued evaluation and 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  HISTORY OF PRESENTING ILLNESS:   Jesus [name] 68 y.o. male is here because of left lower extremity edema and lymphadenopathy.  The patient was seen in the emergency room this past Friday for the same issue.  A CT of the abdomen and pelvis was performed showing bulky left inguinal, left hemipelvic, and retroperitoneal adenopathy.  He was referred to us  from the emergency room for further evaluation.  Doppler ultrasound of the left lower extremity was performed and was negative for DVT. Patient reports that he has been having left lower extremity edema in his left groin and left leg for approximately 1 month.  He states that the swelling in the left groin started to get better but then worsened.  The left lower extremity edema has slowly worsened over time.  Patient denies having fevers and chills.  He reports that he does have night sweats at times.  He reported having [place] approximate 1 [place]  He thinks his headaches are related to not having his blood pressure medication.  His headaches have now resolved.  He denies visual changes.  The patient denies chest pain, shortness of breath and cough.  No nausea, vomiting, constipation, diarrhea.  Denies abdominal pain.  The patient denies recent weight loss and has actually gained weight  recently.  Patient denies epistaxis, bleeding gums, hemoptysis, hematuria, but occasionally, and melena.  He reports increased urinary frequency over the past month but no dysuria.  The patient is here for evaluation and discussion of his recent CT and lab findings.  Interval History:   Jesus [name] 68 y.o. male who is here for continued evaluation and management of newly noted metastatic laryngeal squamous cell carcinoma. He is here to start cycle 14 day 1 of Pembrolizumab .  Patient notes he is doing well overall since her last clinic visit.  No new toxicities from his treatment.  Continues to have some difficulty with swallowing but notes that work with speech therapist has helped. Had a good holiday season. No other acute new symptoms. 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- last radiation 06-2018   Cataract    removed both eyes with l;ens implants    COPD (chronic obstructive pulmonary disease) (HCC)    Family history of colon cancer    in his brother- dx'd age 57    History of chemotherapy    last 06-01-2018   History of kidney stones    History of radiation therapy    last radiation 06-2018   Hyperlipidemia    currently under control   Hypertension    Irregular  heart beats    Lymphadenopathy    Pain, lower leg    Bilateral   Peripheral arterial disease (HCC)    Pre-diabetes    Red-green color blindness    RLS (restless legs syndrome)    Snores    Wears glasses     SURGICAL HISTORY: Past Surgical History:  Procedure Laterality Date   CATARACT EXTRACTION W/ INTRAOCULAR LENS  IMPLANT, BILATERAL     COLONOSCOPY     DIRECT LARYNGOSCOPY Right 02/03/2022   Procedure: DIRECT LARYNGOSCOPY WITH BIOPSY OF RIGHT FALSE VOCAL  CORD;  Surgeon: Ammon Bales, MD;  Location: [facility] OR;  Service: ENT;  Laterality: Right;   dislodged salava stone     FRACTURE SURGERY     HAND ARTHROPLASTY  1995   crushed left hand   INGUINAL LYMPH NODE BIOPSY Left 01/02/2018   Procedure: LEFT INGUINAL LYMPH NODE BIOPSY;  Surgeon: Enid Harry, MD;  Location: [facility] OR;  Service: General;  Laterality: Left;   IR IMAGING GUIDED PORT INSERTION  01/15/2018   IR IMAGING GUIDED PORT INSERTION  08/10/2022   IR REMOVAL TUN ACCESS W/ PORT W/O FL MOD SED  03/11/2019   MEDIASTINOSCOPY N/A 07/22/2022   Procedure: MEDIASTINOSCOPY;  Surgeon: Zelphia Higashi, MD;  Location: [facility] OR;  Service: Thoracic;  Laterality: N/A;   MICROLARYNGOSCOPY Left 01/17/2014   Procedure: MICROLARYNGOSCOPY WITH EXCISION OF THE BIOPSY OF LEFT VOCAL CORD LESION;  Surgeon: Janita Mellow, MD;  Location: [facility];  Service: ENT;  Laterality: Left;   MICROLARYNGOSCOPY N/A 09/16/2020   Procedure: MICROLARYNGOSCOPY with Biopsy of vocal cord lesion;  Surgeon: Ammon Bales, MD;  Location: [facility] OR;  Service: ENT;  Laterality: N/A;   ORIF FOOT FRACTURE  2005   left   REFRACTIVE SURGERY Right    removed cloudiness in right eye after cataract removal     SOCIAL HISTORY: Social History   Socioeconomic History   Marital status: Divorced    Spouse name: Not on file   Number of children: 3   Years of education: Not on file   Highest education level: Not on file  Occupational History   Not on file  Tobacco Use   Smoking status: Former    Current packs/day: 0.00    Average packs/day: 0.5 packs/day for 36.0 years (18.0 ttl pk-yrs)    Types: Cigarettes    Start date: 09/28/1984    Quit date: 09/28/2020    Years since quitting: 2.6   Smokeless tobacco: Never   Tobacco comments:    he denies smoking in about 2 weeks 07/13/18  Vaping Use   Vaping status: Never Used  Substance and Sexual Activity   Alcohol use: Not Currently    Alcohol/week: 6.0 standard drinks of  alcohol    Types: 6 Cans of beer per week    Comment: Last use: Friday 30th   Drug use: Yes    Types: Marijuana, Cocaine    Comment: "Edibles"   Sexual activity: Not Currently  Other Topics Concern   Not on file  Social History Narrative   Not on file   Social Drivers of Health   Financial Resource Strain: Low Risk  (10/06/2020)   Overall Financial Resource Strain (CARDIA)    Difficulty of Paying Living Expenses: Not very hard  Food Insecurity: No Food Insecurity (11/09/2022)   Hunger Vital Sign    Worried About Running Out of Food in the Last Year: Never true    Ran Out of Food in the Last  Year: Never true  Transportation Needs: No Transportation Needs (11/09/2022)   PRAPARE - Administrator, Civil Service (Medical): No    Lack of Transportation (Non-Medical): No  Physical Activity: Not on file  Stress: Not on file  Social Connections: Moderately Integrated (10/06/2020)   Social Connection and Isolation Panel [NHANES]    Frequency of Communication with Friends and Family: Three times a week    Frequency of Social Gatherings with Friends and Family: Three times a week    Attends Religious Services: 1 to 4 times per year    Active Member of Clubs or Organizations: Yes    Attends Banker Meetings: 1 to 4 times per year    Marital Status: Divorced  Intimate Partner Violence: Not At Risk (11/09/2022)   Humiliation, Afraid, Rape, and Kick questionnaire    Fear of Current or Ex-Partner: No    Emotionally Abused: No    Physically Abused: No    Sexually Abused: No    FAMILY HISTORY: Family History  Problem Relation Age of Onset   Breast cancer Mother    Diabetes Father    Hypertension Father    Stroke Father    Mental illness Sister    Hypertension Daughter    Mental illness Daughter    Hypertension Brother    Colon cancer Brother 15       passed away 11-Nov-2018   Breast cancer Sister    Esophageal cancer Neg Hx    Colon polyps Neg Hx    Rectal cancer  Neg Hx    Stomach cancer Neg Hx     ALLERGIES:  is allergic to bee venom, lisinopril , antifungal [miconazole nitrate], and zolpidem tartrate er.  MEDICATIONS:  Current Outpatient Medications  Medication Sig Dispense Refill   acetaminophen  (TYLENOL ) 500 MG tablet Take 1,500 mg by mouth daily as needed for moderate pain.     amLODipine  (NORVASC ) 10 MG tablet TAKE 1 TABLET BY MOUTH EVERY DAY 30 tablet 0   aspirin  EC 81 MG tablet Take 81 mg by mouth every evening. Swallow whole.     b complex vitamins capsule Take 1 capsule by mouth daily.     lidocaine  (XYLOCAINE ) 2 % solution Patient: Mix 1part 2% viscous lidocaine , 1part H20. Swallow 10mL of diluted mixture, before meals and at bedtime, up to QID to help sore throat (Patient not taking: Reported on 05/19/2023) 200 mL 3   lidocaine -prilocaine  (EMLA ) cream Apply to affected area once 30 g 3   metFORMIN (GLUCOPHAGE) 500 MG tablet Take 500 mg by mouth 2 (two) times daily with a meal.     Multiple Vitamins-Minerals (MULTI ADULT GUMMIES PO) Take 1 tablet by mouth in the morning. Centrum     oxyCODONE  (OXY IR/ROXICODONE ) 5 MG immediate release tablet Take 1 tablet (5 mg total) by mouth every 6 (six) hours as needed for moderate pain or breakthrough pain. 20 tablet 0   prochlorperazine  (COMPAZINE ) 10 MG tablet Take 1 tablet (10 mg total) by mouth every 6 (six) hours as needed for nausea or vomiting. 30 tablet 1   tadalafil  (CIALIS ) 20 MG tablet Take 20 mg by mouth daily as needed for erectile dysfunction.     ACCU-CHEK GUIDE test strip 1 each by Other route as needed for other.     Accu-Chek Softclix Lancets lancets      lisinopril  (ZESTRIL ) 20 MG tablet TAKE 1 TABLET BY MOUTH EVERY DAY (Patient taking differently: Take 20 mg by mouth every evening.)  30 tablet 0   magic mouthwash w/lidocaine  SOLN Take 5 mLs by mouth 4 (four) times daily as needed for mouth pain. Compound 400 mL with the following 80 ML of distilled water  80 mL of Maalox 80 mL  of nystatin  500,000 units per 5 mL 80 mL of 2% viscous lidocaine  80 mL of Benadryl  12.5mg  per 5 mL 400 mL 0   nystatin  (MYCOSTATIN ) 100000 UNIT/ML suspension Take 5 mLs (500,000 Units total) by mouth 4 (four) times daily. 140 mL 0   ondansetron  (ZOFRAN ) 8 MG tablet Take 1 tablet (8 mg total) by mouth every 8 (eight) hours as needed for nausea or vomiting. Start on the third day after carboplatin . 30 tablet 1   sucralfate  (CARAFATE ) 1 g tablet Take 1 tablet (1 g total) by mouth 2 (two) times daily. 60 tablet 0   No current facility-administered medications for this visit.    REVIEW OF SYSTEMS:   10 Point review of Systems was done is negative except as noted above.  PHYSICAL EXAMINATION: .BP 120/73 (BP Location: Left Arm, Patient Position: Sitting)   Pulse (!) 54 Comment: Nurse notifieed  Temp 97.7 F (36.5 C) (Temporal)   Resp 16   Ht 6' (1.829 m)   Wt 257 lb 12.8 oz (116.9 kg)   SpO2 99%   BMI 34.96 kg/m  . Jesus [name] GENERAL:alert, in no acute distress and comfortable SKIN: no acute rashes, no significant lesions EYES: conjunctiva are pink and non-injected, sclera anicteric OROPHARYNX: MMM, no exudates, no oropharyngeal erythema or ulceration NECK: supple, no JVD LYMPH:  no palpable lymphadenopathy in the cervical, axillary or inguinal regions LUNGS: clear to auscultation b/l with normal respiratory effort HEART: regular rate & rhythm ABDOMEN:  normoactive bowel sounds , non tender, not distended. Extremity: no pedal edema PSYCH: alert & oriented x 3 with fluent speech NEURO: no focal motor/sensory deficits   LABORATORY DATA:   I have reviewed the data as listed    Latest Ref Rng & Units 05/15/2023    1:01 PM 04/25/2023   11:24 AM 04/03/2023   11:56 AM  CBC  WBC 4.0 - 10.5 K/uL 7.9  5.6  6.5   Hemoglobin 13.0 - 17.0 g/dL 40.9  81.1  91.4   Hematocrit 39.0 - 52.0 % 30.5  31.7  32.3   Platelets 150 - 400 K/uL 288  372  319    CBC    Component Value Date/Time   WBC 7.9  05/15/2023 1301   WBC 6.5 07/19/2022 1400   RBC 3.64 (L) 05/15/2023 1301   HGB 10.1 (L) 05/15/2023 1301   HCT 30.5 (L) 05/15/2023 1301   PLT 288 05/15/2023 1301   MCV 83.8 05/15/2023 1301   MCV 87.3 12/22/2017 1518   MCH 27.7 05/15/2023 1301   MCHC 33.1 05/15/2023 1301   RDW 13.3 05/15/2023 1301   LYMPHSABS 1.8 05/15/2023 1301   MONOABS 0.6 05/15/2023 1301   EOSABS 0.1 05/15/2023 1301   BASOSABS 0.0 05/15/2023 1301       Latest Ref Rng & Units 05/15/2023    1:01 PM 04/25/2023   11:24 AM 04/03/2023   11:56 AM  CMP  Glucose 70 - 99 mg/dL 782  956  213   BUN 8 - 23 mg/dL 22  12  15    Creatinine 0.61 - 1.24 mg/dL 0.86  5.78  4.69   Sodium 135 - 145 mmol/L 135  137  139   Potassium 3.5 - 5.1 mmol/L 4.0  4.1  4.2  Chloride 98 - 111 mmol/L 99  101  105   CO2 22 - 32 mmol/L 29  31  30    Calcium  8.9 - 10.3 mg/dL 9.4  9.6  9.6   Total Protein 6.5 - 8.1 g/dL 8.2  7.5  7.7   Total Bilirubin 0.0 - 1.2 mg/dL 0.4  0.5  0.3   Alkaline Phos 38 - 126 U/L 88  80  83   AST 15 - 41 U/L 12  13  11    ALT 0 - 44 U/L 7  9  7     . Lab Results  Component Value Date   LDH 168 05/18/2022   Tsh 1.42 (05/21/2021)   01/02/18 Left Inguinal LN Bx:   12/26/17 Tissue Flow Cytometry:   12/26/17 Inguinal Core biopsy:      RADIOGRAPHIC STUDIES: I have personally reviewed the radiological images as listed and agreed with the findings in the report. No results found.   ASSESSMENT & PLAN:   This is a pleasant 68 y.o. 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   CT of the abdomen and pelvis performed on 12/22/2017 showed bulky left inguinal, left hemipelvic, and  retroperitoneal adenopathy.     01/02/18 Left inguinal LN Biopsy revealed  T-Cell/histocyte rich Large B-Cell Lymphoma   12/27/17 ECHO revealed LV EF of 55-60%    01/05/18 PET/CT revealed Massively enlarged pelvic lymph nodes intense metabolic activity consistent lymphoma. 2. Additional hypermetabolic lymph nodes in the porta hepatis and retroperitoneum LEFT aorta. 3. Solitary hypermetabolic mediastinal lymph node in the upper LEFT Mediastinum. 4. 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 5. Normal spleen.     HIV non reactive on 12/22/2017.  Hep C and hep B serology negative.   03/14/18 PET/CT revealed PET-CT findings suggest an excellent response to chemotherapy. The abdominal lymphadenopathy has near completely resolved and demonstrates a near complete metabolic response. The pelvic and inguinal adenopathy has significantly decreased in size and the metabolic activity has significantly decreased. 2. Diffuse marrow activity likely due to chemotherapy and or marrow stimulating drugs. I do not see any discrete persistent lesions.    04/17/18 CT Head revealed Subtle mesial caudothalamic hypodensities may be artifact though, the could reflect encephalitis or Wernicke's encephalopathy. Consider MRI of the head with and without contrast. 2. Mild chronic small vessel ischemic changes    S/p 6 cycles of EPOCH-R completed on 06/01/18  06/28/18 PET/CT revealed Continued good response to treatment. No residual measurable or hypermetabolic abdominal lymphadenopathy and no recurrent osseous disease. 2. Interval decrease in size of the left operator region lymph node and also the left inguinal lymph node. However, the both have small foci of slightly increased hypermetabolism which bears surveillance. 3. No new or progressive lymphadenopathy in the neck, chest, abdomen or pelvis.  S/p 39.6 Gy in 22 fractions between 07/25/18 and 08/23/18  02/13/2019 CT C/A/P ([id number]) ([id number]) revealed "1. Response to therapy of pelvic adenopathy compared  to the PET of 06/28/2018. 2. No new or progressive disease. 3. Mild prostatomegaly. 4. 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 5. Hepatomegaly."   2) left lower extremity swelling- now resolved  Doppler ultrasound for DVT was negative in the left lower extremity.   Likely from venous compression +/- lymphatic obstruction  from bulky left inguinal, left hemipelvic, and  retroperitoneal adenopathy.    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  6) Radiation mucositis  PLAN: -Discussed lab results from today, 05/15/2023 in detail with the patient. CBC shows patient is anemic with hemoglobin of 10.1 g/dL Cmp stable -Patient has been tolerating his treatment well without any new or severe toxicities.  -Patient will continue to receive immunotherapy (Pembrolizumab ) without any dose modification.  -Advised patient to eat well and stay well hydrated.  -patient appropriate to proceed with next cycle of Pembrolizumab  immunotherapy.  FOLLOW-UP: Plz schedule next 6 pembrolizumab  treatments per integrated scheduling.  .The total time spent in the appointment was 20 minutes* .  All of the patient's questions were answered with apparent satisfaction. The patient knows to call the clinic with any problems, questions or concerns.   Jacquelyn Matt MD MS AAHIVMS [facility] Hematology/Oncology Physician [facility]  .*Total Encounter Time as defined by the Centers for Medicare and Medicaid Services includes, in addition to the face-to-face time of a patient visit (documented in  the note above) non-face-to-face time: obtaining and reviewing outside history, ordering and reviewing medications, tests or procedures, care coordination (communications with other health care professionals or caregivers) and documentation in the medical record.

## 2023-05-23 LAB — CULTURE, GROUP A STREP (THRC)

## 2023-05-24 ENCOUNTER — Other Ambulatory Visit: Payer: Self-pay

## 2023-05-24 ENCOUNTER — Ambulatory Visit: Payer: PPO | Attending: Radiation Oncology

## 2023-05-24 DIAGNOSIS — R1313 Dysphagia, pharyngeal phase: Secondary | ICD-10-CM | POA: Diagnosis present

## 2023-05-24 DIAGNOSIS — C320 Malignant neoplasm of glottis: Secondary | ICD-10-CM

## 2023-05-24 DIAGNOSIS — C8338 Diffuse large B-cell lymphoma, lymph nodes of multiple sites: Secondary | ICD-10-CM

## 2023-05-24 NOTE — Therapy (Signed)
 OUTPATIENT SPEECH LANGUAGE PATHOLOGY ONCOLOGY TREATMENT/RECERTIFICATION   Patient Name: Jesus [name] MRN: [id number] DOB:1955/12/04, 68 y.o., male Today's Date: 05/24/2023  PCP: [name] REFERRING PROVIDER: Colie Dawes, MD  END OF SESSION:  End of Session - 05/24/23 1202     Visit Number 5    Number of Visits 7    Date for SLP Re-Evaluation 07/23/23    SLP Start Time 1105    SLP Stop Time  1150    SLP Time Calculation (min) 45 min    Activity Tolerance Patient tolerated treatment well              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- last radiation 06-2018   Cataract    removed both eyes with l;ens implants    COPD (chronic obstructive pulmonary disease) (HCC)    Family history of colon cancer    in his brother- dx'd age 29    History of chemotherapy    last 06-01-2018   History of kidney stones    History of radiation therapy    last radiation 06-2018   Hyperlipidemia    currently under control   Hypertension    Irregular heart beats    Lymphadenopathy    Pain, lower leg    Bilateral   Peripheral arterial disease (HCC)    Pre-diabetes    Red-green color blindness    RLS (restless legs syndrome)    Snores    Wears glasses    Past Surgical History:  Procedure Laterality Date   CATARACT EXTRACTION W/ INTRAOCULAR LENS  IMPLANT, BILATERAL     COLONOSCOPY     DIRECT LARYNGOSCOPY Right 02/03/2022   Procedure: DIRECT LARYNGOSCOPY WITH BIOPSY OF RIGHT FALSE VOCAL CORD;  Surgeon: Ammon Bales, MD;  Location: [facility] OR;  Service: ENT;  Laterality: Right;   dislodged salava stone     FRACTURE SURGERY     HAND ARTHROPLASTY  1995   crushed left hand   INGUINAL LYMPH NODE BIOPSY Left 01/02/2018   Procedure: LEFT INGUINAL LYMPH NODE BIOPSY;  Surgeon:  Enid Harry, MD;  Location: [facility] OR;  Service: General;  Laterality: Left;   IR IMAGING GUIDED PORT INSERTION  01/15/2018   IR IMAGING GUIDED PORT INSERTION  08/10/2022   IR REMOVAL TUN ACCESS W/ PORT W/O FL MOD SED  03/11/2019   MEDIASTINOSCOPY N/A 07/22/2022   Procedure: MEDIASTINOSCOPY;  Surgeon: Zelphia Higashi, MD;  Location: [facility] OR;  Service: Thoracic;  Laterality: N/A;   MICROLARYNGOSCOPY Left 01/17/2014   Procedure: MICROLARYNGOSCOPY WITH EXCISION OF THE BIOPSY OF LEFT VOCAL CORD LESION;  Surgeon: Janita Mellow, MD;  Location: [facility];  Service: ENT;  Laterality: Left;   MICROLARYNGOSCOPY N/A 09/16/2020   Procedure: MICROLARYNGOSCOPY with Biopsy of vocal cord lesion;  Surgeon: Ammon Bales, MD;  Location: [facility] OR;  Service: ENT;  Laterality: N/A;   ORIF FOOT FRACTURE  2005   left   REFRACTIVE SURGERY Right    removed cloudiness in right eye after cataract removal    Patient Active Problem List   Diagnosis Date Noted   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   Pain due to onychomycosis of toenail 10/25/2022   Port-A-Cath  in place 09/05/2022   Head and neck cancer (HCC) 08/04/2022   Chronic pain 07/15/2021   Diabetes mellitus without complication (HCC) 07/15/2021   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   Gastroesophageal reflux disease without esophagitis 07/15/2021   History of lymphoma 07/15/2021   Lipoma of skin and subcutaneous tissue of trunk 07/15/2021   Mixed hyperlipidemia 07/15/2021   Prediabetes 07/15/2021   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   Tobacco dependence 07/15/2021   Trigger thumb of left hand 02/17/2021   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   Hoarseness 08/11/2020   Chronic pain of right knee 10/17/2018   Large cell (diffuse) non-Hodgkin's lymphoma (HCC) 05/10/2018   Bacteremia due to Escherichia coli    HTN  (hypertension) 04/17/2018   RLS (restless legs syndrome) 04/17/2018   Abnormal LFTs 04/17/2018   Acute metabolic encephalopathy 04/16/2018   AKI (acute kidney injury) (HCC) 04/16/2018   Dehydration    Fever, unspecified    Sepsis (HCC)    Disorientation    Non-Hodgkin's lymphoma of skin (HCC) 04/03/2018   Hypokalemia    Hypomagnesemia    Anemia    Encounter for antineoplastic chemotherapy    At high risk of tumor lysis syndrome    Swelling of lower leg    Diffuse large B cell lymphoma (HCC) 01/15/2018   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HCC) 01/12/2018   Counseling regarding advance care planning and goals of care 01/12/2018   Bilateral leg pain 05/27/2014    ONSET DATE: see "pertinent details"   REFERRING DIAG: 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 He presented to Dr. Lurena Sally on 11/11/22 to discuss management of nodal metastases from a laryngeal primary referred by Dr. Salomon Cree. To review from last visit on 06/01/22 the patient had recently undergone a right neck lymph node biopsy on 05/25/22 which showed findings consistent with metastatic keratinizing squamous cell carcinoma. Biopsy was indicated by new right neck and mediastinal adenopathy which he first noticed in December 2023. (He soon after presented for an MRI of the brain on 06/02/22 which showed no intracranial abnormalities). Based on biopsy results, the patient followed up with Dr. Salomon Cree who recommended proceeding with a PET scan. Subsequent PET scan on 06/08/22 demonstrated: moderate hypermetabolism associated with the right cervical and mediastinal  metastatic lymphadenopathy; nonspecific mildly prominent activity within the nasopharynx and left pharyngeal tonsil (potentially reactive in etiology); and resolution of the previously demonstrated left pelvic and inguinal adenopathy. PET otherwise showed no evidence of distant metastatic disease and no findings suspicious for recurrent lymphoma. 07/05/22 He saw Dr. Ralston Burkes at [facility] to discuss the biopsy findings. During that visit the patient endorsed increasing hoarseness over the past couple of weeks and intermittent bilateral ear pain. Dr. Ralston Burkes recommended discussing at ENT TB for optimal treatment course. He was subsequently referred to Dr. Luna Salinas on 07/14/22 who recommended proceeding with a mediastinoscopy for additional nodal biopsies/excisions of the left paratracheal adenopathy to confirm his diagnosis. Biopsies of the left paratracheal mass collected on 07/22/22 showed fibrous tissue involved by squamous cell carcinoma; nodal status of 3/3 lymph nodes negative for  metastatic carcinoma. Dr. Luna Salinas did not recommend resection given that it was not possible to remove the visible disease.  Dr. Salomon Cree subsequently recommended proceeding with systemic treatment consisting of carbo/5FU/Pembrolizumab  based on PdL1 status, followed by palliative radiation therapy to any residual disease s/p systemic treatment. He received his first dose on 08/15/22. Chemo toxicities reported by the patient throughout the course of systemic treatment included mild fatigue, nausea (improved with steroids) occasional bilateral hand and leg neuropathy due to Pembrolizumab , and swallowing problems. He received his 4th cycle of chemotherapy on 11/08/22. 11/07/22 PET demonstrated progressive enlargement and hypermetabolic activity within the previously demonstrated right level II cervical and left paratracheal lymph nodes consistent with progressive metastatic disease. PET otherwise showed no evidence of metastatic disease  in the abdomen or pelvis, and no evidence of osseous metastatic disease.Treatment plan:  He will receive 30 fractions of radiation to his right neck and mediastinum.  Radiation started 12/01/22 and will complete 01/13/23  PAIN:  Are you having pain? No  FALLS: Has patient fallen in last 6 months?  No   PATIENT GOALS: Pt indicated he would like to consistently swallow normally  OBJECTIVE:    TODAY'S TREATMENT:                                                                                                                                         DATE:   05/24/23: Pt req'd min A with Masako (tongue protrusion). He states he has been performing HEP with prescribed frequency and reps. CLINICAL SWALLOW ASSESSMENT:   SLP now notes stridor present upon inhalation, and Banks states since last ST session he has incr'd frequency of pharyngeal stasis with almost all solid POs, has incr'd phlegm production, burning in pharynx with spicy foods, and coughing with foods and liquids. He has been eating food items from soft diet and chewing thoroughly, and supplementing with Boost/Ensure. Weight has decr'd approx 15 pounds since last session.  Dentition: adequate natural dentition Vocal quality at baseline: hoarse and rough; stridor on inhalation  Patient directly observed with POs: Yes: dysphagia 3 (soft), dysphagia 1 (puree), and thin liquids  Feeding: able to feed self Liquids provided by: cup Oral phase signs and symptoms:  none noted Pharyngeal phase signs and symptoms: multiple swallows, audible swallow, immediate throat clear, immediate cough, and complaints of residue Liquid wash appeared to assist pharyngeal clearance with applesauce, but less so with fig bar. Turning head to rt and lt did not appear to reduce amount of pharyngeal stasis, per pt report. Because of these sx, SLP recommends MBS. See "clinical impressions" for more details of this assessment.  04/24/23: Pt reports potato chips, dense  foods, rice have reduced pharyngeal clearance if he does not masticate thoroughly. Pt has completed other exercises but not been completing Mendelsohn - no appreciable fibrosis palpated today. However with peanut butter crackers and water  today pt without any notable oral  or pharyngeal deficits - "It's (crackers) disintegrating sort of like the Chez its; If it disintegrates it's easier to swallow going down." Pt was told that he should continue to chew food thoroughly, perform Mendelsohn along with the other exercises, clear throat occasionally with liquids, and take smaller bites and sips as delineated on MBS report. He was again provided with overt s/sx of aspiration PNA.  02/22/23: SLP educated pt about overt s/sx aspiration PNA. Pt has been having a wide variety of foods without reported difficulty. SLP educated pt about overt s/sx aspiration PNA, and he told SLP rationale for HEP with independence. Pt and SLP talked about how pt could modify eating salads to foster better pharyngeal clearance of lettuce (see pt instructions). Pt ate peanut butter crackers today and drank H2O without overt s/sx pharyngeal deficits, nor oral deficits.  HEP completed with independence, he has been completing 20 reps/day/exercise; SLP encouraged pt to complete no less than 20 reps/exercise/day. Pt agreed he could be seen in 2 months due to progress.   01/25/23:Pt dx/d with COVID 01/17/23 with cough x2 days, so recently hasn't felt like doing exercises. Pt performed exercises today with min A for Masako (tongue protrusion). MBS was re-scheduled to 01/31/23. Pt ate peanut butter crackers and drank water  without overt s/sx oral or pharyngeal difficulty, including aspiration. Taking smaller bites.  When pt performed Masako he coughed while performing exercise due to taking a DRINK of water  (pt was instructed to take a DROP of water ). SLP reiterated a DROP of water  and not a sip. Pt then performed Masako correctly using this  strategy successfully. SLP reiterated at least 20 reps/day with no less than 5 reps at once. Before SLP was done saying this pt stated "...and don't do less than 5. I was doing more than 5 - I got more than 20 every day." SLP congratulated pt on this. SLP told pt to use two-three sips of water  prior to taking meds to aid in pharyngeal transit.   12/22/22 (eval): Research states the risk for dysphagia increases due to radiation and/or chemotherapy treatment due to a variety of factors, so SLP educated the pt about the possibility of reduced/limited ability for PO intake during rad tx. SLP also educated pt regarding possible changes to swallowing musculature after rad tx, and why adherence to dysphagia HEP provided today and PO consumption was necessary to inhibit muscle fibrosis following rad tx and to mitigate muscle disuse atrophy. SLP informed pt why this would be detrimental to their swallowing status and to their pulmonary health. Pt demonstrated understanding of these things to SLP. SLP encouraged pt to safely eat and drink as deep into their radiation/chemotherapy as possible to provide the best possible long-term swallowing outcome for pt.    SLP then developed an individualized HEP for pt involving oral and pharyngeal strengthening and ROM and pt was instructed how to perform these exercises, including SLP demonstration. After SLP demonstration, pt return demonstrated each exercise. SLP ensured pt performance was correct prior to educating pt on next exercise. Pt required occasional min-mod cues faded to modified independent to perform HEP. Pt was instructed to complete this program 6-7 days/week, at last 20 reps/day without any less than 5 reps at once, until 6 months after his last day of rad tx, and then x2 a week after that, indefinitely.    PATIENT EDUCATION: Education details:  need for MBS, eat dys I-II foods, dys III foods and chew thoroughly, use sauces/gravies to aid pharyngeal transit,  check with pharmacist to see if he can crush or liquify any of his meds Person educated: Patient Education method: Explanation and Verbal cues Education comprehension: verbalized understanding and needs further education   ASSESSMENT:  CLINICAL IMPRESSION: Patient is a 68 y.o. M who was seen today for treatment of swallowing following radiation therapy ending on 01/13/23. Today when pt ate items from Dys III, and Dys I and drank thin liquids, he had immediate throat clearing and delayed throat clearing with fig bar and applesauce. MBS recommended. No s/sx of oral difficulties noted, See "today's treatment" for more details. There are no overt s/s aspiration PNA observed by SLP nor any reported by pt at this time. Data indicate that pt's swallow ability will likely decrease over the course of radiation/chemoradiation therapy and could very well decline over time following the conclusion of that therapy due to muscle disuse atrophy and/or muscle fibrosis. Pt will cont to need to be seen by SLP in order to assess safety of PO intake, assess the need for recommending any objective swallow assessment, and ensuring pt is correctly completing the individualized HEP.  OBJECTIVE IMPAIRMENTS: include dysphagia. These impairments are limiting patient from safety when swallowing. Factors affecting potential to achieve goals and functional outcome are  none noted . Patient will benefit from skilled SLP services to address above impairments and improve overall function.  REHAB POTENTIAL: Good   GOALS: Goals reviewed with patient? No  SHORT TERM GOALS: Target: 3rd total session        1.   Pt will complete HEP with modified independence in 2 sessions Baseline: 02/22/23 Goal status: Partially met   2.  pt will tell SLP why pt is completing HEP with modified independence Baseline:  Goal status: Met   3.  pt will describe 3 overt s/s aspiration PNA with modified independence Baseline:  Goal status: Met    4.  pt will tell SLP how a food journal could hasten return to a more normalized diet Baseline:  Goal status: Deferred -returned to WNL diet     LONG TERM GOALS: Target: 7th total session   1.  pt will complete HEP with independence over two visits Baseline: 04/24/23 Goal status: continue   2.  pt will describe how to modify HEP over time, and the timeline associated with reduction in HEP frequency with modified independence over two sessions Baseline:  Goal status: continue  3.  Pt will follow precautions from possible MBS in 2 sessions Baseline:  Goal status: continue PLAN:  SLP FREQUENCY:  once every approx 4 weeks  SLP DURATION:  7 sessions  PLANNED INTERVENTIONS: Aspiration precaution training, Pharyngeal strengthening exercises, Diet toleration management , Trials of upgraded texture/liquids, SLP instruction and feedback, Compensatory strategies, and Patient/family education    [facility], CCC-SLP 05/24/2023, 12:04 PM

## 2023-05-25 ENCOUNTER — Other Ambulatory Visit (HOSPITAL_COMMUNITY): Payer: Self-pay | Admitting: Radiation Oncology

## 2023-05-25 DIAGNOSIS — R1310 Dysphagia, unspecified: Secondary | ICD-10-CM

## 2023-05-27 ENCOUNTER — Encounter: Payer: Self-pay | Admitting: Hematology

## 2023-05-29 NOTE — Progress Notes (Signed)
Contacted pt regarding Mychart message. Pt states he has some blood noted when coughing on Friday but it stopped and he had no more incidents over the weekend. Pt states he did cough up some blood today but it had stopped. Pt has ENT appointment on Thursday and stated if he had any more bleeding he would go to the ED. Pt also has a swallowing eval scheduled for next week. Pt will call back if anything changes.

## 2023-06-05 ENCOUNTER — Inpatient Hospital Stay: Payer: PPO

## 2023-06-05 ENCOUNTER — Encounter: Payer: Self-pay | Admitting: Hematology

## 2023-06-05 ENCOUNTER — Inpatient Hospital Stay: Payer: PPO | Admitting: Hematology

## 2023-06-05 NOTE — Progress Notes (Shared)
[facility]   Telephone:([phone]) [phone] Fax:([phone]   CLINIC Notes  Date of Service:  .05/15/2023  Patient Care Team: Noberto Retort, MD as PCP - General (Family Medicine) Wendall Stade, MD as PCP - Cardiology (Cardiology) Lonie Peak, MD as Consulting Physician (Radiation Oncology) Malmfelt, Lise Auer, RN as Oncology Nurse Navigator Osborn Coho, MD (Inactive) as Consulting Physician (Otolaryngology) Johney Maine, MD as Attending Physician (Hematology)    CHIEF COMPLAINTS/PURPOSE OF CONSULTATION:  F/u for continued evaluation and 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  HISTORY OF PRESENTING ILLNESS:   Jesus [name] 68 y.o. male is here because of left lower extremity edema and lymphadenopathy.  The patient was seen in the emergency room this past Friday for the same issue.  A CT of the abdomen and pelvis was performed showing bulky left inguinal, left hemipelvic, and retroperitoneal adenopathy.  He was referred to [place] from the emergency room for further evaluation.  Doppler ultrasound of the left lower extremity was performed and was negative for DVT. Patient reports that he has been having left lower extremity edema in his left groin and left leg for approximately 1 month.  He states that the swelling in the left groin started to get better but then worsened.  The left lower extremity edema has slowly worsened over time.  Patient denies having fevers and chills.  He reports that he does have night sweats at times.  He reported having headaches approximate 1 [place]  He thinks his headaches are related to not having his blood pressure medication.  His headaches have now resolved.  He denies visual changes.  The patient denies chest pain, shortness of breath and cough.  No nausea, vomiting, constipation, diarrhea.  Denies abdominal pain.  The patient denies recent weight loss and has actually gained weight  recently.  Patient denies epistaxis, bleeding gums, hemoptysis, hematuria, but occasionally, and melena.  He reports increased urinary frequency over the past month but no dysuria.  The patient is here for evaluation and discussion of his recent CT and lab findings.  Interval History:   Jesus [name] 68 y.o. male who is here for continued evaluation and management of newly noted metastatic laryngeal squamous cell carcinoma. He is here to start cycle 14 day 1 of Pembrolizumab.  Patient was last seen by me on 05/15/2023 and he complained of mild difficulty when swallowing, but was doing well overall.    -Discussed lab results from today, 06/05/2023, in detail with the patient.   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- last radiation 06-2018   Cataract    removed both eyes with l;ens implants    COPD (chronic obstructive pulmonary disease) (HCC)    Family history of colon cancer    in his brother- dx'd age 38    History of chemotherapy    last 06-01-2018   History of kidney stones    History of radiation therapy    last radiation 06-2018   Hyperlipidemia    currently under control   Hypertension    Irregular heart beats    Lymphadenopathy    Pain, lower leg  Bilateral   Peripheral arterial disease (HCC)    Pre-diabetes    Red-green color blindness    RLS (restless legs syndrome)    Snores    Wears glasses     SURGICAL HISTORY: Past Surgical History:  Procedure Laterality Date   CATARACT EXTRACTION W/ INTRAOCULAR LENS  IMPLANT, BILATERAL     COLONOSCOPY     DIRECT LARYNGOSCOPY Right 02/03/2022   Procedure: DIRECT LARYNGOSCOPY WITH BIOPSY OF RIGHT FALSE VOCAL CORD;  Surgeon: Osborn Coho, MD;  Location: [facility] OR;  Service: ENT;  Laterality: Right;    dislodged salava stone     FRACTURE SURGERY     HAND ARTHROPLASTY  1995   crushed left hand   INGUINAL LYMPH NODE BIOPSY Left 01/02/2018   Procedure: LEFT INGUINAL LYMPH NODE BIOPSY;  Surgeon: Emelia Loron, MD;  Location: [facility] OR;  Service: General;  Laterality: Left;   IR IMAGING GUIDED PORT INSERTION  01/15/2018   IR IMAGING GUIDED PORT INSERTION  08/10/2022   IR REMOVAL TUN ACCESS W/ PORT W/O FL MOD SED  03/11/2019   MEDIASTINOSCOPY N/A 07/22/2022   Procedure: MEDIASTINOSCOPY;  Surgeon: Loreli Slot, MD;  Location: [facility] OR;  Service: Thoracic;  Laterality: N/A;   MICROLARYNGOSCOPY Left 01/17/2014   Procedure: MICROLARYNGOSCOPY WITH EXCISION OF THE BIOPSY OF LEFT VOCAL CORD LESION;  Surgeon: Serena Colonel, MD;  Location: [facility];  Service: ENT;  Laterality: Left;   MICROLARYNGOSCOPY N/A 09/16/2020   Procedure: MICROLARYNGOSCOPY with Biopsy of vocal cord lesion;  Surgeon: Osborn Coho, MD;  Location: [facility] OR;  Service: ENT;  Laterality: N/A;   ORIF FOOT FRACTURE  2005   left   REFRACTIVE SURGERY Right    removed cloudiness in right eye after cataract removal     SOCIAL HISTORY: Social History   Socioeconomic History   Marital status: Divorced    Spouse name: Not on file   Number of children: 3   Years of education: Not on file   Highest education level: Not on file  Occupational History   Not on file  Tobacco Use   Smoking status: Former    Current packs/day: 0.00    Average packs/day: 0.5 packs/day for 36.0 years (18.0 ttl pk-yrs)    Types: Cigarettes    Start date: 09/28/1984    Quit date: 09/28/2020    Years since quitting: 2.6   Smokeless tobacco: Never   Tobacco comments:    he denies smoking in about 2 weeks 07/13/18  Vaping Use   Vaping status: Never Used  Substance and Sexual Activity   Alcohol use: Not Currently    Alcohol/week: 6.0 standard drinks of alcohol    Types: 6 Cans of beer per week    Comment: Last use: Friday 30th   Drug use: Yes     Types: Marijuana, Cocaine    Comment: "Edibles"   Sexual activity: Not Currently  Other Topics Concern   Not on file  Social History Narrative   Not on file   Social Drivers of Health   Financial Resource Strain: Low Risk  (10/06/2020)   Overall Financial Resource Strain (CARDIA)    Difficulty of Paying Living Expenses: Not very hard  Food Insecurity: No Food Insecurity (11/09/2022)   Hunger Vital Sign    Worried About Running Out of Food in the Last Year: Never true    Ran Out of Food in the Last Year: Never true  Transportation Needs: No Transportation Needs (11/09/2022)   PRAPARE - Transportation  Lack of Transportation (Medical): No    Lack of Transportation (Non-Medical): No  Physical Activity: Not on file  Stress: Not on file  Social Connections: Moderately Integrated (10/06/2020)   Social Connection and Isolation Panel [NHANES]    Frequency of Communication with Friends and Family: Three times a week    Frequency of Social Gatherings with Friends and Family: Three times a week    Attends Religious Services: 1 to 4 times per year    Active Member of Clubs or Organizations: Yes    Attends Banker Meetings: 1 to 4 times per year    Marital Status: Divorced  Intimate Partner Violence: Not At Risk (11/09/2022)   Humiliation, Afraid, Rape, and Kick questionnaire    Fear of Current or Ex-Partner: No    Emotionally Abused: No    Physically Abused: No    Sexually Abused: No    FAMILY HISTORY: Family History  Problem Relation Age of Onset   Breast cancer Mother    Diabetes Father    Hypertension Father    Stroke Father    Mental illness Sister    Hypertension Daughter    Mental illness Daughter    Hypertension Brother    Colon cancer Brother 30       passed away 2018-11-13   Breast cancer Sister    Esophageal cancer Neg Hx    Colon polyps Neg Hx    Rectal cancer Neg Hx    Stomach cancer Neg Hx     ALLERGIES:  is allergic to bee venom, lisinopril,  antifungal [miconazole nitrate], and zolpidem tartrate er.  MEDICATIONS:  Current Outpatient Medications  Medication Sig Dispense Refill   ACCU-CHEK GUIDE test strip 1 each by Other route as needed for other.     Accu-Chek Softclix Lancets lancets      acetaminophen (TYLENOL) 500 MG tablet Take 1,500 mg by mouth daily as needed for moderate pain.     amLODipine (NORVASC) 10 MG tablet TAKE 1 TABLET BY MOUTH EVERY DAY 30 tablet 0   aspirin EC 81 MG tablet Take 81 mg by mouth every evening. Swallow whole.     b complex vitamins capsule Take 1 capsule by mouth daily.     lidocaine (XYLOCAINE) 2 % solution Patient: Mix 1part 2% viscous lidocaine, 1part H20. Swallow 10mL of diluted mixture, before meals and at bedtime, up to QID to help sore throat (Patient not taking: Reported on 05/19/2023) 200 mL 3   lidocaine-prilocaine (EMLA) cream Apply to affected area once 30 g 3   lisinopril (ZESTRIL) 20 MG tablet TAKE 1 TABLET BY MOUTH EVERY DAY (Patient taking differently: Take 20 mg by mouth every evening.) 30 tablet 0   magic mouthwash w/lidocaine SOLN Take 5 mLs by mouth 4 (four) times daily as needed for mouth pain. Compound 400 mL with the following 80 ML of distilled water 80 mL of Maalox 80 mL of nystatin 500,000 units per 5 mL 80 mL of 2% viscous lidocaine 80 mL of Benadryl 12.5mg  per 5 mL 400 mL 0   metFORMIN (GLUCOPHAGE) 500 MG tablet Take 500 mg by mouth 2 (two) times daily with a meal.     Multiple Vitamins-Minerals (MULTI ADULT GUMMIES PO) Take 1 tablet by mouth in the morning. Centrum     nystatin (MYCOSTATIN) 100000 UNIT/ML suspension Take 5 mLs (500,000 Units total) by mouth 4 (four) times daily. 140 mL 0   ondansetron (ZOFRAN) 8 MG tablet Take 1 tablet (8 mg total)  by mouth every 8 (eight) hours as needed for nausea or vomiting. Start on the third day after carboplatin. 30 tablet 1   oxyCODONE (OXY IR/ROXICODONE) 5 MG immediate release tablet Take 1 tablet (5 mg total) by mouth  every 6 (six) hours as needed for moderate pain or breakthrough pain. 20 tablet 0   prochlorperazine (COMPAZINE) 10 MG tablet Take 1 tablet (10 mg total) by mouth every 6 (six) hours as needed for nausea or vomiting. 30 tablet 1   sucralfate (CARAFATE) 1 g tablet Take 1 tablet (1 g total) by mouth 2 (two) times daily. 60 tablet 0   tadalafil (CIALIS) 20 MG tablet Take 20 mg by mouth daily as needed for erectile dysfunction.     No current facility-administered medications for this visit.    REVIEW OF SYSTEMS:   10 Point review of Systems was done is negative except as noted above.  PHYSICAL EXAMINATION: .There were no vitals taken for this visit. . . GENERAL:alert, in no acute distress and comfortable SKIN: no acute rashes, no significant lesions EYES: conjunctiva are pink and non-injected, sclera anicteric OROPHARYNX: MMM, no exudates, no oropharyngeal erythema or ulceration NECK: supple, no JVD LYMPH:  no palpable lymphadenopathy in the cervical, axillary or inguinal regions LUNGS: clear to auscultation b/l with normal respiratory effort HEART: regular rate & rhythm ABDOMEN:  normoactive bowel sounds , non tender, not distended. Extremity: no pedal edema PSYCH: alert & oriented x 3 with fluent speech NEURO: no focal motor/sensory deficits   LABORATORY DATA:   I have reviewed the data as listed    Latest Ref Rng & Units 05/15/2023    1:01 PM 04/25/2023   11:24 AM 04/03/2023   11:56 AM  CBC  WBC 4.0 - 10.5 K/uL 7.9  5.6  6.5   Hemoglobin 13.0 - 17.0 g/dL 16.1  09.6  04.5   Hematocrit 39.0 - 52.0 % 30.5  31.7  32.3   Platelets 150 - 400 K/uL 288  372  319    CBC    Component Value Date/Time   WBC 7.9 05/15/2023 1301   WBC 6.5 07/19/2022 1400   RBC 3.64 (L) 05/15/2023 1301   HGB 10.1 (L) 05/15/2023 1301   HCT 30.5 (L) 05/15/2023 1301   PLT 288 05/15/2023 1301   MCV 83.8 05/15/2023 1301   MCV 87.3 12/22/2017 1518   MCH 27.7 05/15/2023 1301   MCHC 33.1 05/15/2023 1301    RDW 13.3 05/15/2023 1301   LYMPHSABS 1.8 05/15/2023 1301   MONOABS 0.6 05/15/2023 1301   EOSABS 0.1 05/15/2023 1301   BASOSABS 0.0 05/15/2023 1301       Latest Ref Rng & Units 05/15/2023    1:01 PM 04/25/2023   11:24 AM 04/03/2023   11:56 AM  CMP  Glucose 70 - 99 mg/dL 409  811  914   BUN 8 - 23 mg/dL 22  12  15    Creatinine 0.61 - 1.24 mg/dL 7.82  9.56  2.13   Sodium 135 - 145 mmol/L 135  137  139   Potassium 3.5 - 5.1 mmol/L 4.0  4.1  4.2   Chloride 98 - 111 mmol/L 99  101  105   CO2 22 - 32 mmol/L 29  31  30    Calcium 8.9 - 10.3 mg/dL 9.4  9.6  9.6   Total Protein 6.5 - 8.1 g/dL 8.2  7.5  7.7   Total Bilirubin 0.0 - 1.2 mg/dL 0.4  0.5  0.3  Alkaline Phos 38 - 126 U/L 88  80  83   AST 15 - 41 U/L 12  13  11    ALT 0 - 44 U/L 7  9  7     . Lab Results  Component Value Date   LDH 168 05/18/2022   Tsh 1.42 (05/21/2021)   01/02/18 Left Inguinal LN Bx:   12/26/17 Tissue Flow Cytometry:   12/26/17 Inguinal Core biopsy:      RADIOGRAPHIC STUDIES: I have personally reviewed the radiological images as listed and agreed with the findings in the report. No results found.   ASSESSMENT & PLAN:   This is a pleasant 68 y.o. 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   CT of the abdomen and pelvis performed on 12/22/2017 showed bulky left inguinal, left hemipelvic, and  retroperitoneal adenopathy.     01/02/18 Left inguinal LN Biopsy revealed T-Cell/histocyte rich Large B-Cell Lymphoma   12/27/17 ECHO revealed LV EF of 55-60%    01/05/18 PET/CT revealed Massively enlarged pelvic lymph nodes intense metabolic activity consistent lymphoma. 2. Additional hypermetabolic lymph nodes in the porta hepatis and retroperitoneum  LEFT aorta. 3. Solitary hypermetabolic mediastinal lymph node in the upper LEFT Mediastinum. 4. 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 5. Normal spleen.     HIV non reactive on 12/22/2017.  Hep C and hep B serology negative.   03/14/18 PET/CT revealed PET-CT findings suggest an excellent response to chemotherapy. The abdominal lymphadenopathy has near completely resolved and demonstrates a near complete metabolic response. The pelvic and inguinal adenopathy has significantly decreased in size and the metabolic activity has significantly decreased. 2. Diffuse marrow activity likely due to chemotherapy and or marrow stimulating drugs. I do not see any discrete persistent lesions.    04/17/18 CT Head revealed Subtle mesial caudothalamic hypodensities may be artifact though, the could reflect encephalitis or Wernicke's encephalopathy. Consider MRI of the head with and without contrast. 2. Mild chronic small vessel ischemic changes    S/p 6 cycles of EPOCH-R completed on 06/01/18  06/28/18 PET/CT revealed Continued good response to treatment. No residual measurable or hypermetabolic abdominal lymphadenopathy and no recurrent osseous disease. 2. Interval decrease in size of the left operator region lymph node and also the left inguinal lymph node. However, the both have small foci of slightly increased hypermetabolism which bears surveillance. 3. No new or progressive lymphadenopathy in the neck, chest, abdomen or pelvis.  S/p 39.6 Gy in 22 fractions between 07/25/18 and 08/23/18  02/13/2019 CT C/A/P ([id number]) ([id number]) revealed "1. Response to therapy of pelvic adenopathy compared to the PET of 06/28/2018. 2. No new or progressive disease. 3. Mild prostatomegaly. 4. 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 5. Hepatomegaly."   2) left lower extremity swelling- now resolved  Doppler ultrasound for DVT was  negative in the left lower extremity.   Likely from venous compression +/- lymphatic obstruction from bulky left inguinal, left hemipelvic, and  retroperitoneal adenopathy.    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  6) Radiation mucositis  PLAN: -Discussed lab results from today, 05/15/2023 in detail with the patient. CBC shows patient is anemic with hemoglobin of 10.1 g/dL Cmp stable -Patient has been tolerating his treatment well without any new or severe toxicities.  -Patient will continue to receive immunotherapy (Pembrolizumab) without any dose modification.  -Advised patient to eat well and stay well hydrated.  -patient appropriate to proceed with next cycle of Pembrolizumab immunotherapy.  FOLLOW-UP: ***  The total time spent in the appointment was *** minutes* .  All of the patient's questions were answered with apparent satisfaction. The patient knows to call the clinic with any problems, questions or concerns.   Wyvonnia Lora MD MS AAHIVMS [facility] Hematology/Oncology Physician [facility]  .*Total Encounter Time as defined by the Centers for Medicare and Medicaid Services includes, in addition to the face-to-face time of a patient visit (documented in the note above) non-face-to-face time: obtaining and reviewing outside history, ordering and reviewing medications, tests or procedures, care coordination (communications with other health care professionals or caregivers) and documentation in the medical record.   I,Param Shah,acting as a Neurosurgeon for Wyvonnia Lora, MD.,have documented all relevant  documentation on the behalf of Wyvonnia Lora, MD,as directed by  Wyvonnia Lora, MD while in the presence of Wyvonnia Lora, MD.

## 2023-06-06 ENCOUNTER — Ambulatory Visit (HOSPITAL_COMMUNITY)
Admission: RE | Admit: 2023-06-06 | Discharge: 2023-06-06 | Disposition: A | Discharge status: Home / Self Care | Payer: PPO | Source: Ambulatory Visit | Attending: Family Medicine | Admitting: Family Medicine

## 2023-06-06 ENCOUNTER — Other Ambulatory Visit (HOSPITAL_COMMUNITY): Payer: Self-pay | Admitting: Radiation Oncology

## 2023-06-06 ENCOUNTER — Encounter (INDEPENDENT_AMBULATORY_CARE_PROVIDER_SITE_OTHER): Payer: Self-pay

## 2023-06-06 DIAGNOSIS — R059 Cough, unspecified: Secondary | ICD-10-CM | POA: Diagnosis present

## 2023-06-06 DIAGNOSIS — C320 Malignant neoplasm of glottis: Secondary | ICD-10-CM | POA: Diagnosis not present

## 2023-06-06 DIAGNOSIS — R1314 Dysphagia, pharyngoesophageal phase: Secondary | ICD-10-CM | POA: Diagnosis not present

## 2023-06-06 DIAGNOSIS — C8338 Diffuse large B-cell lymphoma, lymph nodes of multiple sites: Secondary | ICD-10-CM | POA: Diagnosis not present

## 2023-06-06 DIAGNOSIS — R1310 Dysphagia, unspecified: Secondary | ICD-10-CM

## 2023-06-06 NOTE — Progress Notes (Signed)
   Pt appears with very poor PES clearance as primary source of his dysphagia.  Proximal and mid pharyngeal motility and laryngeal closure appears functional for po intake.  Recommend consider referral to see if pt would benefit from potential surgical intervention of proximal cervical esophagus.   Rolena Infante, MS [name] SLP Acute [employer] [phone]

## 2023-06-07 ENCOUNTER — Telehealth: Payer: Self-pay | Admitting: Hematology

## 2023-06-07 ENCOUNTER — Other Ambulatory Visit: Payer: Self-pay | Admitting: Otolaryngology

## 2023-06-07 DIAGNOSIS — R1314 Dysphagia, pharyngoesophageal phase: Secondary | ICD-10-CM

## 2023-06-07 DIAGNOSIS — C320 Malignant neoplasm of glottis: Secondary | ICD-10-CM

## 2023-06-07 NOTE — Telephone Encounter (Signed)
Left patient a vm regarding upcoming appointment

## 2023-06-09 ENCOUNTER — Ambulatory Visit
Admission: RE | Admit: 2023-06-09 | Discharge: 2023-06-09 | Disposition: A | Discharge status: Home / Self Care | Payer: PPO | Source: Ambulatory Visit | Attending: Otolaryngology | Admitting: Otolaryngology

## 2023-06-09 ENCOUNTER — Other Ambulatory Visit: Payer: Self-pay | Admitting: Otolaryngology

## 2023-06-09 DIAGNOSIS — R1314 Dysphagia, pharyngoesophageal phase: Secondary | ICD-10-CM

## 2023-06-09 DIAGNOSIS — C320 Malignant neoplasm of glottis: Secondary | ICD-10-CM

## 2023-06-12 ENCOUNTER — Other Ambulatory Visit: Payer: Self-pay

## 2023-06-12 ENCOUNTER — Encounter: Payer: Self-pay | Admitting: Podiatry

## 2023-06-12 ENCOUNTER — Ambulatory Visit: Payer: PPO | Admitting: Podiatry

## 2023-06-12 ENCOUNTER — Encounter: Payer: Self-pay | Admitting: Hematology

## 2023-06-12 DIAGNOSIS — B351 Tinea unguium: Secondary | ICD-10-CM

## 2023-06-12 DIAGNOSIS — I739 Peripheral vascular disease, unspecified: Secondary | ICD-10-CM

## 2023-06-12 DIAGNOSIS — M79675 Pain in left toe(s): Secondary | ICD-10-CM

## 2023-06-12 DIAGNOSIS — C320 Malignant neoplasm of glottis: Secondary | ICD-10-CM

## 2023-06-12 DIAGNOSIS — M79674 Pain in right toe(s): Secondary | ICD-10-CM | POA: Diagnosis not present

## 2023-06-12 DIAGNOSIS — C8338 Diffuse large B-cell lymphoma, lymph nodes of multiple sites: Secondary | ICD-10-CM

## 2023-06-12 NOTE — Progress Notes (Signed)
 Subjective:  Patient ID: Jesus [name], male    DOB: [birth year],  MRN: [id number] Jesus [name] presents to clinic today for:  Chief Complaint  Patient presents with   Debridement    Trim toenails   Patient notes nails are thick, discolored, elongated and painful in shoegear when trying to ambulate.  Patient notes that he will be getting his esophagus dilated in the near future because he has had been having difficulty swallowing in the eating solid food.  Patient never had the lower extremity vascular studies performed that were ordered in October 2024.  He states that he is not sure if anyone left a message regarding scheduling appointment.  PCP is [name], MD.  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- last radiation 06-2018   Cataract    removed both eyes with l;ens implants    COPD (chronic obstructive pulmonary disease) (HCC)    Family history of colon cancer    in his brother- dx'd age 75    History of chemotherapy    last 06-01-2018   History of kidney stones    History of radiation therapy    last radiation 06-2018   Hyperlipidemia    currently under control   Hypertension    Irregular heart beats    Lymphadenopathy    Pain, lower leg    Bilateral   Peripheral arterial disease (HCC)    Pre-diabetes    Red-green color blindness    RLS (restless legs syndrome)    Snores    Wears glasses     Past Surgical History:  Procedure Laterality Date   CATARACT EXTRACTION W/ INTRAOCULAR LENS  IMPLANT, BILATERAL     COLONOSCOPY     DIRECT LARYNGOSCOPY Right 02/03/2022   Procedure: DIRECT LARYNGOSCOPY WITH BIOPSY OF RIGHT FALSE VOCAL CORD;  Surgeon: Ammon Bales, MD;  Location: [facility] OR;  Service: ENT;  Laterality: Right;   dislodged salava stone     FRACTURE  SURGERY     HAND ARTHROPLASTY  1995   crushed left hand   INGUINAL LYMPH NODE BIOPSY Left 01/02/2018   Procedure: LEFT INGUINAL LYMPH NODE BIOPSY;  Surgeon: Enid Harry, MD;  Location: [facility] OR;  Service: General;  Laterality: Left;   IR IMAGING GUIDED PORT INSERTION  01/15/2018   IR IMAGING GUIDED PORT INSERTION  08/10/2022   IR REMOVAL TUN ACCESS W/ PORT W/O FL MOD SED  03/11/2019   MEDIASTINOSCOPY N/A 07/22/2022   Procedure: MEDIASTINOSCOPY;  Surgeon: Zelphia Higashi, MD;  Location: [facility] OR;  Service: Thoracic;  Laterality: N/A;   MICROLARYNGOSCOPY Left 01/17/2014   Procedure: MICROLARYNGOSCOPY WITH EXCISION OF THE BIOPSY OF LEFT VOCAL CORD LESION;  Surgeon: Janita Mellow, MD;  Location: [facility];  Service: ENT;  Laterality: Left;   MICROLARYNGOSCOPY N/A 09/16/2020   Procedure: MICROLARYNGOSCOPY with Biopsy of vocal cord lesion;  Surgeon: Ammon Bales, MD;  Location: [facility] OR;  Service: ENT;  Laterality: N/A;   ORIF FOOT FRACTURE  2005   left   REFRACTIVE SURGERY Right    removed cloudiness in right eye after cataract removal     Allergies  Allergen Reactions   Bee Venom Anaphylaxis   Lisinopril  Other (  See Comments)    Swelling in face   Antifungal [Miconazole Nitrate] Other (See Comments)     made his head hurt   Zolpidem Tartrate Er Other (See Comments)    Leg cramps    Review of Systems: Negative except as noted in the HPI.  Objective:  Jesus [name] is a pleasant 68 y.o. male in NAD. AAO x 3.  Vascular Examination: Capillary refill time is  delayed to toes bilateral. Non-palpable pedal pulses b/l LE. Digital hair sparse b/l.  Skin temperature gradient WNL b/l.   Dermatological Examination: Pedal skin with decreased turgor, texture and tone b/l. No open wounds. No interdigital macerations b/l. Toenails x10 are 3mm thick, discolored, dystrophic with subungual debris. There is pain with compression of the nail plates.  They are elongated  x10  Assessment/Plan: 1. Pain due to onychomycosis of toenails of both feet   2. PVD (peripheral vascular disease) (HCC)    The mycotic toenails were sharply debrided x10 with sterile nail nippers and a power debriding burr to decrease bulk/thickness and length.    Will reorder his noninvasive vascular studies of the lower extremity bilateral.  Patient encouraged to listen for his phone and watch the caller ID as well as check his phone messages to be able to return the call and get this scheduled so we can proceed with testing.  Return in about 3 months (around 09/09/2023) for RFC.   Joe Murders, DPM, FACFAS [facility]     [address] [place], [place] [address]                Office [phone]  Fax [phone]

## 2023-06-13 ENCOUNTER — Inpatient Hospital Stay (HOSPITAL_BASED_OUTPATIENT_CLINIC_OR_DEPARTMENT_OTHER): Payer: PPO | Admitting: Hematology

## 2023-06-13 ENCOUNTER — Inpatient Hospital Stay: Payer: PPO

## 2023-06-13 ENCOUNTER — Other Ambulatory Visit: Payer: Self-pay

## 2023-06-13 VITALS — BP 159/74 | HR 100 | Temp 97.7°F | Resp 20 | Wt 239.7 lb

## 2023-06-13 DIAGNOSIS — C320 Malignant neoplasm of glottis: Secondary | ICD-10-CM

## 2023-06-13 DIAGNOSIS — Z79899 Other long term (current) drug therapy: Secondary | ICD-10-CM | POA: Insufficient documentation

## 2023-06-13 DIAGNOSIS — Z87891 Personal history of nicotine dependence: Secondary | ICD-10-CM | POA: Insufficient documentation

## 2023-06-13 DIAGNOSIS — C760 Malignant neoplasm of head, face and neck: Secondary | ICD-10-CM

## 2023-06-13 DIAGNOSIS — C8338 Diffuse large B-cell lymphoma, lymph nodes of multiple sites: Secondary | ICD-10-CM

## 2023-06-13 DIAGNOSIS — Z95828 Presence of other vascular implants and grafts: Secondary | ICD-10-CM

## 2023-06-13 DIAGNOSIS — Z5112 Encounter for antineoplastic immunotherapy: Secondary | ICD-10-CM | POA: Insufficient documentation

## 2023-06-13 DIAGNOSIS — R1310 Dysphagia, unspecified: Secondary | ICD-10-CM | POA: Diagnosis not present

## 2023-06-13 DIAGNOSIS — C7951 Secondary malignant neoplasm of bone: Secondary | ICD-10-CM | POA: Insufficient documentation

## 2023-06-13 DIAGNOSIS — Z8572 Personal history of non-Hodgkin lymphomas: Secondary | ICD-10-CM | POA: Insufficient documentation

## 2023-06-13 DIAGNOSIS — Z7189 Other specified counseling: Secondary | ICD-10-CM

## 2023-06-13 DIAGNOSIS — Z5111 Encounter for antineoplastic chemotherapy: Secondary | ICD-10-CM

## 2023-06-13 LAB — CBC WITH DIFFERENTIAL (CANCER CENTER ONLY)
Abs Immature Granulocytes: 0.03 10*3/uL (ref 0.00–0.07)
Basophils Absolute: 0.1 10*3/uL (ref 0.0–0.1)
Basophils Relative: 1 %
Eosinophils Absolute: 0.1 10*3/uL (ref 0.0–0.5)
Eosinophils Relative: 1 %
HCT: 26.8 % — ABNORMAL LOW (ref 39.0–52.0)
Hemoglobin: 8.1 g/dL — ABNORMAL LOW (ref 13.0–17.0)
Immature Granulocytes: 0 %
Lymphocytes Relative: 18 %
Lymphs Abs: 1.4 10*3/uL (ref 0.7–4.0)
MCH: 25.2 pg — ABNORMAL LOW (ref 26.0–34.0)
MCHC: 30.2 g/dL (ref 30.0–36.0)
MCV: 83.5 fL (ref 80.0–100.0)
Monocytes Absolute: 0.6 10*3/uL (ref 0.1–1.0)
Monocytes Relative: 8 %
Neutro Abs: 5.9 10*3/uL (ref 1.7–7.7)
Neutrophils Relative %: 72 %
Platelet Count: 524 10*3/uL — ABNORMAL HIGH (ref 150–400)
RBC: 3.21 MIL/uL — ABNORMAL LOW (ref 4.22–5.81)
RDW: 13.6 % (ref 11.5–15.5)
WBC Count: 8.1 10*3/uL (ref 4.0–10.5)
nRBC: 0 % (ref 0.0–0.2)

## 2023-06-13 LAB — CMP (CANCER CENTER ONLY)
ALT: 8 U/L (ref 0–44)
AST: 10 U/L — ABNORMAL LOW (ref 15–41)
Albumin: 3.8 g/dL (ref 3.5–5.0)
Alkaline Phosphatase: 82 U/L (ref 38–126)
Anion gap: 6 (ref 5–15)
BUN: 18 mg/dL (ref 8–23)
CO2: 32 mmol/L (ref 22–32)
Calcium: 9.6 mg/dL (ref 8.9–10.3)
Chloride: 98 mmol/L (ref 98–111)
Creatinine: 1.2 mg/dL (ref 0.61–1.24)
GFR, Estimated: >60 mL/min (ref >60)
Glucose, Bld: 149 mg/dL — ABNORMAL HIGH (ref 70–99)
Potassium: 4 mmol/L (ref 3.5–5.1)
Sodium: 136 mmol/L (ref 135–145)
Total Bilirubin: 0.4 mg/dL (ref 0.0–1.2)
Total Protein: 8.5 g/dL — ABNORMAL HIGH (ref 6.5–8.1)

## 2023-06-13 LAB — TSH: TSH: 4.511 u[IU]/mL — ABNORMAL HIGH (ref 0.350–4.500)

## 2023-06-13 MED ORDER — SODIUM CHLORIDE 0.9 % IV SOLN: Freq: Once | INTRAVENOUS | Status: AC

## 2023-06-13 MED ORDER — HEPARIN SOD (PORK) LOCK FLUSH 100 UNIT/ML IV SOLN
500.0000 [IU] | Freq: Once | INTRAVENOUS | Status: AC | PRN
  Administered 2023-06-13: 500 [IU]

## 2023-06-13 MED ORDER — SODIUM CHLORIDE 0.9 % IV SOLN
200.0000 mg | Freq: Once | INTRAVENOUS | Status: AC
  Administered 2023-06-13: 200 mg via INTRAVENOUS
  Filled 2023-06-13: qty 200

## 2023-06-13 MED ORDER — SODIUM CHLORIDE 0.9% FLUSH
10.0000 mL | INTRAVENOUS | Status: DC | PRN
  Administered 2023-06-13: 10 mL

## 2023-06-13 MED ORDER — SODIUM CHLORIDE 0.9% FLUSH
10.0000 mL | Freq: Once | INTRAVENOUS | Status: AC
  Administered 2023-06-13: 10 mL

## 2023-06-13 MED ORDER — ALTEPLASE 2 MG IJ SOLR
2.0000 mg | Freq: Once | INTRAMUSCULAR | Status: AC
  Administered 2023-06-13: 2 mg
  Filled 2023-06-13: qty 2

## 2023-06-13 NOTE — Progress Notes (Signed)
 Patient seen by Dr. Minnie Amber are within treatment parameters.  Labs reviewed: and are within treatment parameters.  Per physician team, patient is ready for treatment and there are NO modifications to the treatment plan. Adding 1 liter of NS to run concurrently with tx

## 2023-06-13 NOTE — Patient Instructions (Signed)
 [facility]  Discharge Instructions: Thank you for choosing [facility] to provide your oncology and hematology care.   If you have a lab appointment with the Cancer Center, please go directly to the Cancer Center and check in at the registration area.   Wear comfortable clothing and clothing appropriate for easy access to any Portacath or PICC line.   We strive to give you quality time with your provider. You may need to reschedule your appointment if you arrive late (15 or more minutes).  Arriving late affects you and other patients whose appointments are after yours.  Also, if you miss three or more appointments without notifying the office, you may be dismissed from the clinic at the provider's discretion.      For prescription refill requests, have your pharmacy contact our office and allow 72 hours for refills to be completed.    Today you received the following chemotherapy and/or immunotherapy agents: pembrolizumab      To help prevent nausea and vomiting after your treatment, we encourage you to take your nausea medication as directed.  BELOW ARE SYMPTOMS THAT SHOULD BE REPORTED IMMEDIATELY: *FEVER GREATER THAN 100.4 F (38 C) OR HIGHER *CHILLS OR SWEATING *NAUSEA AND VOMITING THAT IS NOT CONTROLLED WITH YOUR NAUSEA MEDICATION *UNUSUAL SHORTNESS OF BREATH *UNUSUAL BRUISING OR BLEEDING *URINARY PROBLEMS (pain or burning when urinating, or frequent urination) *BOWEL PROBLEMS (unusual diarrhea, constipation, pain near the anus) TENDERNESS IN MOUTH AND THROAT WITH OR WITHOUT PRESENCE OF ULCERS (sore throat, sores in mouth, or a toothache) UNUSUAL RASH, SWELLING OR PAIN  UNUSUAL VAGINAL DISCHARGE OR ITCHING   Items with * indicate a potential emergency and should be followed up as soon as possible or go to the Emergency Department if any problems should occur.  Please show the CHEMOTHERAPY ALERT CARD or  IMMUNOTHERAPY ALERT CARD at check-in to the Emergency Department and triage nurse.  Should you have questions after your visit or need to cancel or reschedule your appointment, please contact [facility] - [facility]  Dept: [phone]  and follow the prompts.  Office hours are 8:00 a.m. to 4:30 p.m. Monday - Friday. Please note that voicemails left after 4:00 p.m. may not be returned until the following business day.  We are closed weekends and major holidays. You have access to a nurse at all times for urgent questions. Please call the main number to the clinic Dept: [phone] and follow the prompts.   For any non-urgent questions, you may also contact your provider using MyChart. We now offer e-Visits for anyone 78 and older to request care online for non-urgent symptoms. For details visit [email or web address].   Also download the MyChart app! Go to the app store, search "MyChart", open the app, select [place], and log in with your MyChart username and password.

## 2023-06-13 NOTE — Progress Notes (Signed)
 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  HISTORY OF PRESENTING ILLNESS:   Jesus [name] 68 y.o. male is here because of left lower extremity edema and lymphadenopathy.  The patient was seen in the emergency room this past Friday for the same issue.  A CT of the abdomen and pelvis was performed showing bulky left inguinal, left hemipelvic, and retroperitoneal adenopathy.  He was referred to us  from the emergency room for further evaluation.  Doppler ultrasound of the left lower extremity was performed and was negative for DVT. Patient reports that he has been having left lower extremity edema in his left groin and left leg for approximately 1 month.  He states that the swelling in the left groin started to get better but then worsened.  The left lower extremity edema has slowly worsened over time.  Patient denies having fevers and chills.  He reports that he does have night sweats at times.  He reported having headaches approximate 1 [place]  He thinks his headaches are related to not having his blood pressure medication.  His headaches have now resolved.  He denies visual changes.  The patient denies chest pain, shortness of breath and cough.  No nausea, vomiting, constipation, diarrhea.  Denies abdominal pain.  The patient denies recent weight loss and has actually gained weight  recently.  Patient denies epistaxis, bleeding gums, hemoptysis, hematuria, but occasionally, and melena.  He reports increased urinary frequency over the past month but no dysuria.  The patient is here for evaluation and discussion of his recent CT and lab findings.  Interval History:   Jesus [name] 68 y.o. male who is here for continued evaluation and management of newly noted metastatic laryngeal squamous cell carcinoma.  Patient was last seen by me on 05/15/2023 and he was doing well overall with continued mild swallowing difficulty.   Patient notes he has been doing well overall since our last visit. He complains of swallowing difficulties when drinking liquid and eating any type of food. He went to Otolaryngologist, Dr. Donalee Fruits, where he had swallow study. He is scheduled to minor surgery, which will stretch his throat, on March 12th. His Otolaryngologist recommended feeding tube if surgery does not improve his symptoms.    He notes he coughed up blood around 1-2 weeks ago. He denies nose bleeds, gum bleeds, black stool, hematuria, blood in stool.    He also complains of fatigue/lethargy due to not eating or drinking well.   He denies any new infection issues, fever, chills, night sweats, unexpected weight loss, back pain, chest pain, or leg swelling.   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- last  radiation 06-2018   Cataract    removed both eyes with l;ens implants    COPD (chronic obstructive pulmonary disease) (HCC)    Family history of colon cancer    in his brother- dx'd age 47    History of chemotherapy    last 06-01-2018   History of kidney stones    History of radiation therapy    last radiation 06-2018   Hyperlipidemia    currently under control    Hypertension    Irregular heart beats    Lymphadenopathy    Pain, lower leg    Bilateral   Peripheral arterial disease (HCC)    Pre-diabetes    Red-green color blindness    RLS (restless legs syndrome)    Snores    Wears glasses     SURGICAL HISTORY: Past Surgical History:  Procedure Laterality Date   CATARACT EXTRACTION W/ INTRAOCULAR LENS  IMPLANT, BILATERAL     COLONOSCOPY     DIRECT LARYNGOSCOPY Right 02/03/2022   Procedure: DIRECT LARYNGOSCOPY WITH BIOPSY OF RIGHT FALSE VOCAL CORD;  Surgeon: Ammon Bales, MD;  Location: [facility] OR;  Service: ENT;  Laterality: Right;   dislodged salava stone     FRACTURE SURGERY     HAND ARTHROPLASTY  1995   crushed left hand   INGUINAL LYMPH NODE BIOPSY Left 01/02/2018   Procedure: LEFT INGUINAL LYMPH NODE BIOPSY;  Surgeon: Enid Harry, MD;  Location: [facility] OR;  Service: General;  Laterality: Left;   IR IMAGING GUIDED PORT INSERTION  01/15/2018   IR IMAGING GUIDED PORT INSERTION  08/10/2022   IR REMOVAL TUN ACCESS W/ PORT W/O FL MOD SED  03/11/2019   MEDIASTINOSCOPY N/A 07/22/2022   Procedure: MEDIASTINOSCOPY;  Surgeon: Zelphia Higashi, MD;  Location: [facility] OR;  Service: Thoracic;  Laterality: N/A;   MICROLARYNGOSCOPY Left 01/17/2014   Procedure: MICROLARYNGOSCOPY WITH EXCISION OF THE BIOPSY OF LEFT VOCAL CORD LESION;  Surgeon: Janita Mellow, MD;  Location: [facility];  Service: ENT;  Laterality: Left;   MICROLARYNGOSCOPY N/A 09/16/2020   Procedure: MICROLARYNGOSCOPY with Biopsy of vocal cord lesion;  Surgeon: Ammon Bales, MD;  Location: [facility] OR;  Service: ENT;  Laterality: N/A;   ORIF FOOT FRACTURE  2005   left   REFRACTIVE SURGERY Right    removed cloudiness in right eye after cataract removal     SOCIAL HISTORY: Social History   Socioeconomic History   Marital status: Divorced    Spouse name: Not on file   Number of children: 3   Years of education: Not on file   Highest education level: Not on file  Occupational  History   Not on file  Tobacco Use   Smoking status: Former    Current packs/day: 0.00    Average packs/day: 0.5 packs/day for 36.0 years (18.0 ttl pk-yrs)    Types: Cigarettes    Start date: 09/28/1984    Quit date: 09/28/2020    Years since quitting: 2.7   Smokeless tobacco: Never   Tobacco comments:    he denies smoking in about 2 weeks 07/13/18  Vaping Use   Vaping status: Never Used  Substance and Sexual Activity   Alcohol use: Not Currently    Alcohol/week: 6.0 standard drinks of alcohol    Types: 6 Cans of beer per week    Comment: Last use: Friday 30th   Drug use: Yes    Types: Marijuana, Cocaine    Comment: "Edibles"   Sexual activity: Not Currently  Other  Topics Concern   Not on file  Social History Narrative   Not on file   Social Drivers of Health   Financial Resource Strain: Low Risk  (10/06/2020)   Overall Financial Resource Strain (CARDIA)    Difficulty of Paying Living Expenses: Not very hard  Food Insecurity: No Food Insecurity (11/09/2022)   Hunger Vital Sign    Worried About Running Out of Food in the Last Year: Never true    Ran Out of Food in the Last Year: Never true  Transportation Needs: No Transportation Needs (11/09/2022)   PRAPARE - Administrator, Civil Service (Medical): No    Lack of Transportation (Non-Medical): No  Physical Activity: Not on file  Stress: Not on file  Social Connections: Moderately Integrated (10/06/2020)   Social Connection and Isolation Panel [NHANES]    Frequency of Communication with Friends and Family: Three times a week    Frequency of Social Gatherings with Friends and Family: Three times a week    Attends Religious Services: 1 to 4 times per year    Active Member of Clubs or Organizations: Yes    Attends Banker Meetings: 1 to 4 times per year    Marital Status: Divorced  Intimate Partner Violence: Not At Risk (11/09/2022)   Humiliation, Afraid, Rape, and Kick questionnaire    Fear of Current or  Ex-Partner: No    Emotionally Abused: No    Physically Abused: No    Sexually Abused: No    FAMILY HISTORY: Family History  Problem Relation Age of Onset   Breast cancer Mother    Diabetes Father    Hypertension Father    Stroke Father    Mental illness Sister    Hypertension Daughter    Mental illness Daughter    Hypertension Brother    Colon cancer Brother 78       passed away 2018-12-03   Breast cancer Sister    Esophageal cancer Neg Hx    Colon polyps Neg Hx    Rectal cancer Neg Hx    Stomach cancer Neg Hx     ALLERGIES:  is allergic to bee venom, lisinopril , antifungal [miconazole nitrate], and zolpidem tartrate er.  MEDICATIONS:  Current Outpatient Medications  Medication Sig Dispense Refill   ACCU-CHEK GUIDE test strip 1 each by Other route as needed for other.     Accu-Chek Softclix Lancets lancets      acetaminophen  (TYLENOL ) 500 MG tablet Take 1,500 mg by mouth daily as needed for moderate pain.     amLODipine  (NORVASC ) 10 MG tablet TAKE 1 TABLET BY MOUTH EVERY DAY 30 tablet 0   AREXVY 120 MCG/0.5ML injection      aspirin  EC 81 MG tablet Take 81 mg by mouth every evening. Swallow whole.     azithromycin  (ZITHROMAX ) 200 MG/5ML suspension 12.5 milliliters x 1 day, then 6 milliliters daily x 4 days Orally Once a day for 5 days     b complex vitamins capsule Take 1 capsule by mouth daily.     lidocaine  (XYLOCAINE ) 2 % solution Patient: Mix 1part 2% viscous lidocaine , 1part H20. Swallow 10mL of diluted mixture, before meals and at bedtime, up to QID to help sore throat (Patient not taking: Reported on 05/19/2023) 200 mL 3   lidocaine -prilocaine  (EMLA ) cream Apply to affected area once 30 g 3   metFORMIN (GLUCOPHAGE) 500 MG tablet Take 500 mg by mouth 2 (two) times daily with a meal.  Multiple Vitamins-Minerals (MULTI ADULT GUMMIES PO) Take 1 tablet by mouth in the morning. Centrum     nystatin  (MYCOSTATIN ) 100000 UNIT/ML suspension Take 5 mLs (500,000 Units total)  by mouth 4 (four) times daily. 140 mL 0   oxyCODONE  (OXY IR/ROXICODONE ) 5 MG immediate release tablet Take 1 tablet (5 mg total) by mouth every 6 (six) hours as needed for moderate pain or breakthrough pain. 20 tablet 0   prochlorperazine  (COMPAZINE ) 10 MG tablet Take 1 tablet (10 mg total) by mouth every 6 (six) hours as needed for nausea or vomiting. 30 tablet 1   sildenafil (VIAGRA) 100 MG tablet 1 tablet as needed Orally Once a day for 30 days     No current facility-administered medications for this visit.    REVIEW OF SYSTEMS:   10 Point review of Systems was done is negative except as noted above.  PHYSICAL EXAMINATION: .BP (!) 159/74 (BP Location: Left Arm, Patient Position: Sitting)   Pulse 100   Temp 97.7 F (36.5 C) (Temporal)   Resp 20   Wt 239 lb 11.2 oz (108.7 kg)   SpO2 99%   BMI 32.51 kg/m   GENERAL:alert, in no acute distress and comfortable SKIN: no acute rashes, no significant lesions EYES: conjunctiva are pink and non-injected, sclera anicteric OROPHARYNX: MMM, no exudates, no oropharyngeal erythema or ulceration NECK: supple, no JVD LYMPH:  no palpable lymphadenopathy in the cervical, axillary or inguinal regions LUNGS: clear to auscultation b/l with normal respiratory effort HEART: regular rate & rhythm ABDOMEN:  normoactive bowel sounds , non tender, not distended. Extremity: no pedal edema PSYCH: alert & oriented x 3 with fluent speech NEURO: no focal motor/sensory deficits   LABORATORY DATA:   I have reviewed the data as listed    Latest Ref Rng & Units 06/19/2023    3:25 AM 06/18/2023    3:38 AM 06/17/2023    4:20 AM  CBC  WBC 4.0 - 10.5 K/uL 7.2  6.2  6.2   Hemoglobin 13.0 - 17.0 g/dL 8.4  6.9  7.0   Hematocrit 39.0 - 52.0 % 27.6  23.2  23.6   Platelets 150 - 400 K/uL 494  478  489    CBC    Component Value Date/Time   WBC 7.2 06/19/2023 0325   RBC 3.26 (L) 06/19/2023 0325   HGB 8.4 (L) 06/19/2023 0325   HGB 8.1 (L) 06/13/2023 1452   HCT  27.6 (L) 06/19/2023 0325   PLT 494 (H) 06/19/2023 0325   PLT 524 (H) 06/13/2023 1452   MCV 84.7 06/19/2023 0325   MCV 87.3 12/22/2017 1518   MCH 25.8 (L) 06/19/2023 0325   MCHC 30.4 06/19/2023 0325   RDW 13.5 06/19/2023 0325   LYMPHSABS 1.6 06/16/2023 0422   MONOABS 0.7 06/16/2023 0422   EOSABS 0.1 06/16/2023 0422   BASOSABS 0.1 06/16/2023 0422       Latest Ref Rng & Units 06/19/2023    3:25 AM 06/18/2023    3:38 AM 06/17/2023    4:20 AM  CMP  Glucose 70 - 99 mg/dL 83  829  562   BUN 8 - 23 mg/dL 10  11  14    Creatinine 0.61 - 1.24 mg/dL 1.30  8.65  7.84   Sodium 135 - 145 mmol/L 138  139  137   Potassium 3.5 - 5.1 mmol/L 3.9  4.1  3.8   Chloride 98 - 111 mmol/L 104  106  105   CO2 22 - 32 mmol/L  24  23  23    Calcium  8.9 - 10.3 mg/dL 9.0  8.8  8.7    . Lab Results  Component Value Date   LDH 168 05/18/2022   Tsh 1.42 (05/21/2021)   01/02/18 Left Inguinal LN Bx:   12/26/17 Tissue Flow Cytometry:   12/26/17 Inguinal Core biopsy:      RADIOGRAPHIC STUDIES: I have personally reviewed the radiological images as listed and agreed with the findings in the report. 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 Dysphagia. EXAM: ESOPHOGRAM/BARIUM SWALLOW TECHNIQUE: Single contrast examination was performed using thin barium or water  soluble. FLUOROSCOPY: Radiation Exposure Index (as provided by the fluoroscopic device): 5.2 mGy Kerma Total fluoroscopy time: 17 seconds. COMPARISON:  Modified speech swallow study 06/06/2023 and 05/25/2023; PET-CT 04/14/2023; CT chest 05/05/2022 FINDINGS: Prior to the start of the procedure, the patient's history was reviewed. Speech pathology report from modified barium swallow study 06/06/2023 stated the patient was able to eat solid foods without penetration or aspiration. There was penetration without aspiration with honey  thick nectar liquids. There was aspiration of thin liquids without reflex for coughing or regurgitation for clearance. Therefore, the current study was initiated with caution. The patient swallowed a sip of thin barium and penetration was seen to the level of the vocal cords. He describes a feeling of mucous in his throat. There was no immediate cough reflex. I asked the patient to dry swallow and try to clear the thin liquid. Eventually the liquid above the vocal cords diminished at subsequent images. On one more sip of thin liquid barium, penetration was again seen with trace aspiration below the vocal cords. The patient's voice sounded wet. Therefore, this esophagram study was terminated. IMPRESSION: Penetration with thin liquid barium to the level of the vocal cords. Trace aspiration below the vocal cords. Therefore, the rest of this esophagram study could not be completed. Electronically Signed   By: Bertina Broccoli M.D.   On: 06/09/2023 11:43   DG SWALLOW FUNC OP MEDICARE SPEECH PATH Result Date: 06/06/2023 Table formatting from the original result was not included. Modified Barium Swallow Study Patient Details Name: SOLLY DERASMO MRN: [id number] Date of Birth: [birth year] Today's Date: 06/06/2023 HPI/PMH: HPI: Mr. Rehman is a 68 year old male referred by outpatient speech pathologist for repeat MBS study.  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  Patient with peritracheal node mets right neck lymph node biopsied 05/25/2022 showed the metastatic keratinizing squamous cell carcinoma.  He also has history of diffuse B-cell lymphoma, chronic pain, GERD, diabetes mellitus, anemia, dehydration, cocaine use, EtOH, tobacco use, restless leg syndrome, and hypokalemia, now reporting worsening dysphagia.  Patient saw ENT Dr. Donalee Fruits 06/01/2023 who scoped him and  noted he had diffuse supraglottic edema, fixed left vocal cord, impaired motility of the right cord, after which he was diagnosed with pharyngeal esophageal dysphagia.  Dr. Regenia Cape note also indicates patient with lower anterior neck being hard and full and notes excess secretions and wet voice.Patient reports weight loss due to dysphagia and excessive secretions starting in November 2024.  He endorses weight loss of 15 pounds in last month and is undergoing immunotherapy at this time.  Dr. Regenia Cape note indicates patient may need feeding tube but patient wishes to forego this if at  all possible.  SLP saw patient September 2024 showing minimal aspiration of thin liquid mixed with secretions due to decreased laryngeal closure and elevation but strong pharyngeal swallow.  In September 2024 patient reported he turned his head to the right to help things clear into his esophagus but now states that is not helpful anymore.  He has used nystatin  in the past he is not on a PPI.  Had question if patient had osteophytes and some impairment near C5 and C6 in cervical esophagus.  In September 2024 he sensed food and pills sticking was noted to have tracheal deviation to the right from his left paratracheal enlarged lymph node on April 2024 chest x-ray and also had complaint of neck spasms.  Patient reports that he thought he was going to need the Heimlich maneuver couple days ago when he choked on cracker and also endorses difficulties with getting Ensure down.  PET scan 04/18/2023 showed decreased in size and hyper metabolic activity at right level 2 cervical node mets, left peritracheal mass appears slightly larger but does have some central necrosis and increased focal activity along the right arytenoid cartilage not corresponding with recent CT per rad note. Clinical Impression: Clinical Impression: Patient presents with severe pharyngo-cervical esophageal dysphagia.  Decreased opening of PES region allows backflow of  barium with subsequent penetration and aspiration of backflowed liquid material and secretions.  Reclined to approximately 45* allows most backflowed barium to be retained in posterior pharynx to prevent aspiration. Pt is able transit backflowed material with sequential dry swallows.  Liquid wash helps to decrease cervical esophageal retention of very small bites of solid/pudding.   His proximal and mid-pharyngeal motility as well as laryngeal closure appears adequate for po intake. Recommend refer back to ENT to determine if surgical intervention to cervical esophageal would mitigate his dyshagia and allow po intake for nutrition and hydration.  Pt expresses he does not want a feeding tube if at all possible.   In the interim time, advise pt maximize his liquid nutrition for swallow efficiency in reclined position.  Follow solids with liquids and conduct dry swallows across all po trials.  Advised pt to recommendations using teach back.  Thanks for this referral. Factors that may increase risk of adverse event in presence of aspiration Roderick Civatte & Jessy Morocco 2021): No data recorded Recommendations/Plan: Swallowing Evaluation Recommendations Swallowing Evaluation Recommendations Recommendations: PO diet (pending follow up with MD re: options for potential proximal esophageal intervention) PO Diet Recommendation: Dysphagia 3 (Mechanical soft); Thin liquids (Level 0) Liquid Administration via: Cup; Straw Medication Administration: -- (suspension or crushed with liquids  - ? V8 if not contraindicated) Supervision: Patient able to self-feed Swallowing strategies  : Small bites/sips; Multiple dry swallows after each bite/sip; Follow solids with liquids (HOB reclined to approx 45*) Postural changes: Stay upright 30-60 min after meals; Out of bed for meals; Partially reclined for meals Oral care recommendations: Oral care QID (4x/day) Recommended consults: Other(comment) (follow up with ENT) Treatment Plan No data recorded  Recommendations Recommendations for follow up therapy are one component of a multi-disciplinary discharge planning process, led by the attending physician.  Recommendations may be updated based on patient status, additional functional criteria and insurance authorization. Assessment: Orofacial Exam: Orofacial Exam Oral Cavity: Oral Hygiene: WFL Oral Cavity - Dentition: Adequate natural dentition Orofacial Anatomy: WFL Oral Motor/Sensory Function: WFL Anatomy: Anatomy: Other (Comment) (Appearance of poor clearance through proximal esophagus with severe backflow as primary source of dysphagia) Boluses Administered: Boluses Administered Boluses Administered: Thin liquids (  Level 0); Mildly thick liquids (Level 2, nectar thick); Puree; Solid  Oral Impairment Domain: Oral Impairment Domain Lip Closure: No labial escape Tongue control during bolus hold: Cohesive bolus between tongue to palatal seal Bolus preparation/mastication: Timely and efficient chewing and mashing Bolus transport/lingual motion: Brisk tongue motion Oral residue: Trace residue lining oral structures Location of oral residue : Tongue Initiation of pharyngeal swallow : Valleculae  Pharyngeal Impairment Domain: Pharyngeal Impairment Domain Soft palate elevation: No bolus between soft palate (SP)/pharyngeal wall (PW) Laryngeal elevation: Partial superior movement of thyroid  cartilage/partial approximation of arytenoids to epiglottic petiole Anterior hyoid excursion: Partial anterior movement Epiglottic movement: Partial inversion Laryngeal vestibule closure: Incomplete, narrow column air/contrast in laryngeal vestibule Pharyngeal stripping wave : Present - complete Pharyngeal contraction (A/P view only): N/A Pharyngoesophageal segment opening: Minimal distention/minimal duration, marked obstruction of flow Tongue base retraction: Trace column of contrast or air between tongue base and PPW Pharyngeal residue: Trace residue within or on pharyngeal structures  Location of pharyngeal residue: Valleculae; Aryepiglottic folds; Pyriform sinuses  Esophageal Impairment Domain: Esophageal Impairment Domain Esophageal clearance upright position: -- (proximal retention noted, distally appeared with trace amount) Pill: Pill Consistency administered: -- (DNT) Penetration/Aspiration Scale Score: Penetration/Aspiration Scale Score 1.  Material does not enter airway: Puree; Solid 2.  Material enters airway, remains ABOVE vocal cords then ejected out: Mildly thick liquids (Level 2, nectar thick) 8.  Material enters airway, passes BELOW cords without attempt by patient to eject out (silent aspiration) : Thin liquids (Level 0) Compensatory Strategies: Compensatory Strategies Compensatory strategies: Yes Effortful swallow: Ineffective Ineffective Effortful Swallow: Thin liquid (Level 0) Multiple swallows: Effective Effective Multiple Swallows: Thin liquid (Level 0); Mildly thick liquid (Level 2, nectar thick) Liquid wash: Effective; Ineffective Effective Liquid Wash: Puree; Solid (partially effective) Reclining posture: Effective (to keep barium in posterior pharynx - and prevent him from aspirating) Effective Reclining Posture: Thin liquid (Level 0); Mildly thick liquid (Level 2, nectar thick) Posterior head tilt: Effective Effective Posterior head tilt: Thin liquid (Level 0); Mildly thick liquid (Level 2, nectar thick)   General Information: Caregiver present: No  Diet Prior to this Study: Regular; Thin liquids (Level 0)   Temperature : Normal   Respiratory Status: WFL   Supplemental O2: None (Room air)   History of Recent Intubation: No  Behavior/Cognition: Alert; Cooperative; Pleasant mood Self-Feeding Abilities: Able to self-feed Baseline vocal quality/speech: Dysphonic; Other (comment) (wet voice) Volitional Cough: Able to elicit Volitional Swallow: Able to elicit Exam Limitations: No limitations Goal Planning: No data recorded No data recorded No data recorded No data recorded No  data recorded Pain: Pain Assessment Pain Assessment: No/denies pain End of Session: Start Time:SLP Start Time (ACUTE ONLY): 1350 Stop Time: SLP Stop Time (ACUTE ONLY): 1435 Time Calculation:SLP Time Calculation (min) (ACUTE ONLY): 45 min Charges: SLP Evaluations $ SLP Speech Visit: 1 Visit SLP Evaluations $Outpatient MBS Swallow: 1 Procedure $Swallowing Treatment: 1 Procedure SLP visit diagnosis: SLP Visit Diagnosis: Dysphagia, pharyngoesophageal phase (R13.14) Past Medical History: Past Medical History: Diagnosis Date  Allergy   Anemia   during chemo  Arthritis   knee   Blood transfusion without reported diagnosis   Cancer (HCC)   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- last radiation 06-2018  Cataract   removed both eyes with l;ens implants   COPD (chronic obstructive pulmonary disease) (HCC)   Family history of colon cancer   in his brother- dx'd age 37   History of chemotherapy   last 06-01-2018  History of kidney stones   History of radiation therapy   last radiation 06-2018  Hyperlipidemia   currently under control  Hypertension   Irregular heart beats   Lymphadenopathy   Pain, lower leg   Bilateral  Peripheral arterial disease (HCC)   Pre-diabetes   Red-green color blindness   RLS (restless legs syndrome)   Snores   Wears glasses  Past Surgical History: Past Surgical History: Procedure Laterality Date  CATARACT EXTRACTION W/ INTRAOCULAR LENS  IMPLANT, BILATERAL    COLONOSCOPY    DIRECT LARYNGOSCOPY Right 02/03/2022  Procedure: DIRECT LARYNGOSCOPY WITH BIOPSY OF RIGHT FALSE VOCAL CORD;  Surgeon: Ammon Bales, MD;  Location: [facility] OR;  Service: ENT;  Laterality: Right;  dislodged salava stone    FRACTURE SURGERY    HAND ARTHROPLASTY  1995  crushed left hand  INGUINAL LYMPH NODE BIOPSY Left 01/02/2018  Procedure: LEFT INGUINAL LYMPH NODE BIOPSY;  Surgeon: Enid Harry, MD;  Location: [facility] OR;  Service: General;  Laterality: Left;  IR IMAGING GUIDED PORT INSERTION  01/15/2018  IR  IMAGING GUIDED PORT INSERTION  08/10/2022  IR REMOVAL TUN ACCESS W/ PORT W/O FL MOD SED  03/11/2019  MEDIASTINOSCOPY N/A 07/22/2022  Procedure: MEDIASTINOSCOPY;  Surgeon: Zelphia Higashi, MD;  Location: [facility] OR;  Service: Thoracic;  Laterality: N/A;  MICROLARYNGOSCOPY Left 01/17/2014  Procedure: MICROLARYNGOSCOPY WITH EXCISION OF THE BIOPSY OF LEFT VOCAL CORD LESION;  Surgeon: Janita Mellow, MD;  Location: [facility];  Service: ENT;  Laterality: Left;  MICROLARYNGOSCOPY N/A 09/16/2020  Procedure: MICROLARYNGOSCOPY with Biopsy of vocal cord lesion;  Surgeon: Ammon Bales, MD;  Location: [facility] OR;  Service: ENT;  Laterality: N/A;  ORIF FOOT FRACTURE  2005  left  REFRACTIVE SURGERY Right   removed cloudiness in right eye after cataract removal  Maudie Sorrow, MS [facility] SLP Acute Rehab Services Office [phone] [place]al Comment 06/06/2023, 4:30 PM CLINICAL DATA:  Dysphagia. Cough/GE reflux disease/other secondary diagnosis EXAM: MODIFIED BARIUM SWALLOW TECHNIQUE: Different consistencies of barium were administered orally to the patient by the Speech Pathologist. Imaging of the pharynx was performed in the lateral projection. The radiologist was present in the fluoroscopy room for this study, providing personal supervision. FLUOROSCOPY TIME:  Radiation Exposure Index (as provided by the fluoroscopic device): 5.9 mGy Kerma COMPARISON:  None Available. FINDINGS: Fluoroscopic guidance was provided while modified barium swallow was performed by the speech pathologist. Please refer to the Speech Pathology report for results and recommendations. IMPRESSION: Please refer to the Speech Pathologists report for complete details and recommendations. Electronically Signed   By: Rosalene Colon M.D.   On: 06/06/2023 15:24    ASSESSMENT & PLAN:   This is a pleasant 68 y.o. 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   CT of the abdomen and pelvis performed on 12/22/2017 showed bulky left inguinal, left hemipelvic, and  retroperitoneal adenopathy.     01/02/18 Left inguinal LN Biopsy revealed T-Cell/histocyte rich Large B-Cell Lymphoma   12/27/17 ECHO revealed LV EF of 55-60%    01/05/18 PET/CT revealed Massively enlarged pelvic lymph nodes intense metabolic activity consistent lymphoma. 2. Additional hypermetabolic lymph nodes in the porta hepatis and retroperitoneum LEFT aorta. 3. Solitary hypermetabolic mediastinal lymph node in the upper LEFT Mediastinum. 4. Multiple discrete sites of hypermetabolic skeletal metastasis (approximately  5 sites). 5. Normal spleen.     HIV non reactive on 12/22/2017.  Hep C and hep B serology negative.   03/14/18 PET/CT revealed PET-CT findings suggest an excellent response to chemotherapy. The abdominal lymphadenopathy has near completely resolved and demonstrates a near complete metabolic response. The pelvic and inguinal adenopathy has significantly decreased in size and the metabolic activity has significantly decreased. 2. Diffuse marrow activity likely due to chemotherapy and or marrow stimulating drugs. I do not see any discrete persistent lesions.    04/17/18 CT Head revealed Subtle mesial caudothalamic hypodensities may be artifact though, the could reflect encephalitis or Wernicke's encephalopathy. Consider MRI of the head with and without contrast. 2. Mild chronic small vessel ischemic changes    S/p 6 cycles of EPOCH-R completed on 06/01/18  06/28/18 PET/CT revealed Continued good response to treatment. No residual measurable or hypermetabolic abdominal lymphadenopathy and no recurrent osseous disease. 2. Interval decrease in size of the left operator region lymph node and also the left inguinal lymph node.  However, the both have small foci of slightly increased hypermetabolism which bears surveillance. 3. No new or progressive lymphadenopathy in the neck, chest, abdomen or pelvis.  S/p 39.6 Gy in 22 fractions between 07/25/18 and 08/23/18  02/13/2019 CT C/A/P ([id number]) ([id number]) revealed "1. Response to therapy of pelvic adenopathy compared to the PET of 06/28/2018. 2. No new or progressive disease. 3. Mild prostatomegaly. 4. 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 5. Hepatomegaly."   2) left lower extremity swelling- now resolved  Doppler ultrasound for DVT was negative in the left lower extremity.   Likely from venous compression +/- lymphatic obstruction from bulky left inguinal, left hemipelvic, and  retroperitoneal adenopathy.    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 PLAN: -Discussed lab results from today, 06/13/2023, in detail with the patient. CBC shows patient is anemic with Hgb of 8.1 g/dL with Hct of 37.6% and elevated platelets of 524 K.  CMP stable -Recommend to get liquid iron supplement.  -Patient has been tolerating his treatment well without any new or severe toxicities.  -Patient will continue to receive immunotherapy (Pembrolizumab ) without any dose modification.  -patient appropriate to proceed with next cycle of Pembrolizumab  immunotherapy. -Continue to follow-up with Otolaryngologist to address cervical esophageal dysphagia ...being scheduled for esophageal dilatation. If that does not work then might need consideration of feeding tube.   FOLLOW-UP: Per integrated  scheduling  The total time spent in the appointment was 30 minutes* .  All of the patient's questions were answered with apparent satisfaction. The patient knows to call the clinic with any problems, questions or concerns.   Jacquelyn Matt MD MS AAHIVMS [facility] Hematology/Oncology Physician [facility]  .*Total Encounter Time as defined by the Centers for Medicare and Medicaid Services includes, in addition to the face-to-face time of a patient visit (documented in the note above) non-face-to-face time: obtaining and reviewing outside history, ordering and reviewing medications, tests or procedures, care coordination (communications with other health care professionals or caregivers) and documentation in the medical record.   I,Param Shah,acting as a Neurosurgeon for Jacquelyn Matt, MD.,have documented all relevant documentation on the behalf of  Jacquelyn Matt, MD,as directed by  Jacquelyn Matt, MD while in the presence of Jacquelyn Matt, MD.   .I have reviewed the above documentation for accuracy and completeness, and I agree with the above. .Omeka Holben Kishore Latamara Melder MD

## 2023-06-14 LAB — T4: T4, Total: 7.6 ug/dL (ref 4.5–12.0)

## 2023-06-15 ENCOUNTER — Encounter (HOSPITAL_COMMUNITY): Payer: Self-pay | Admitting: Emergency Medicine

## 2023-06-15 ENCOUNTER — Inpatient Hospital Stay (HOSPITAL_COMMUNITY)
Admission: EM | Admit: 2023-06-15 | Discharge: 2023-06-24 | DRG: 146 | Disposition: A | Discharge status: Home / Self Care | Payer: PPO | Attending: Internal Medicine | Admitting: Internal Medicine

## 2023-06-15 ENCOUNTER — Other Ambulatory Visit: Payer: Self-pay

## 2023-06-15 ENCOUNTER — Emergency Department (HOSPITAL_COMMUNITY): Payer: PPO

## 2023-06-15 DIAGNOSIS — K9429 Other complications of gastrostomy: Secondary | ICD-10-CM | POA: Diagnosis not present

## 2023-06-15 DIAGNOSIS — D849 Immunodeficiency, unspecified: Secondary | ICD-10-CM

## 2023-06-15 DIAGNOSIS — N401 Enlarged prostate with lower urinary tract symptoms: Secondary | ICD-10-CM | POA: Diagnosis present

## 2023-06-15 DIAGNOSIS — Z800 Family history of malignant neoplasm of digestive organs: Secondary | ICD-10-CM

## 2023-06-15 DIAGNOSIS — K219 Gastro-esophageal reflux disease without esophagitis: Secondary | ICD-10-CM | POA: Diagnosis present

## 2023-06-15 DIAGNOSIS — C760 Malignant neoplasm of head, face and neck: Secondary | ICD-10-CM | POA: Diagnosis present

## 2023-06-15 DIAGNOSIS — Z818 Family history of other mental and behavioral disorders: Secondary | ICD-10-CM

## 2023-06-15 DIAGNOSIS — E669 Obesity, unspecified: Secondary | ICD-10-CM | POA: Diagnosis present

## 2023-06-15 DIAGNOSIS — D630 Anemia in neoplastic disease: Secondary | ICD-10-CM | POA: Diagnosis present

## 2023-06-15 DIAGNOSIS — R042 Hemoptysis: Secondary | ICD-10-CM | POA: Diagnosis present

## 2023-06-15 DIAGNOSIS — J449 Chronic obstructive pulmonary disease, unspecified: Secondary | ICD-10-CM | POA: Diagnosis present

## 2023-06-15 DIAGNOSIS — Z6832 Body mass index (BMI) 32.0-32.9, adult: Secondary | ICD-10-CM

## 2023-06-15 DIAGNOSIS — Z87891 Personal history of nicotine dependence: Secondary | ICD-10-CM

## 2023-06-15 DIAGNOSIS — C8338 Diffuse large B-cell lymphoma, lymph nodes of multiple sites: Secondary | ICD-10-CM | POA: Diagnosis present

## 2023-06-15 DIAGNOSIS — Z823 Family history of stroke: Secondary | ICD-10-CM

## 2023-06-15 DIAGNOSIS — N179 Acute kidney failure, unspecified: Secondary | ICD-10-CM | POA: Diagnosis present

## 2023-06-15 DIAGNOSIS — Z1152 Encounter for screening for COVID-19: Secondary | ICD-10-CM

## 2023-06-15 DIAGNOSIS — R000 Tachycardia, unspecified: Secondary | ICD-10-CM | POA: Diagnosis present

## 2023-06-15 DIAGNOSIS — E1151 Type 2 diabetes mellitus with diabetic peripheral angiopathy without gangrene: Secondary | ICD-10-CM | POA: Diagnosis present

## 2023-06-15 DIAGNOSIS — E43 Unspecified severe protein-calorie malnutrition: Secondary | ICD-10-CM | POA: Insufficient documentation

## 2023-06-15 DIAGNOSIS — I10 Essential (primary) hypertension: Secondary | ICD-10-CM | POA: Diagnosis present

## 2023-06-15 DIAGNOSIS — C4492 Squamous cell carcinoma of skin, unspecified: Secondary | ICD-10-CM

## 2023-06-15 DIAGNOSIS — Z7982 Long term (current) use of aspirin: Secondary | ICD-10-CM

## 2023-06-15 DIAGNOSIS — J189 Pneumonia, unspecified organism: Principal | ICD-10-CM

## 2023-06-15 DIAGNOSIS — C320 Malignant neoplasm of glottis: Principal | ICD-10-CM | POA: Diagnosis present

## 2023-06-15 DIAGNOSIS — R1314 Dysphagia, pharyngoesophageal phase: Secondary | ICD-10-CM | POA: Diagnosis present

## 2023-06-15 DIAGNOSIS — J398 Other specified diseases of upper respiratory tract: Secondary | ICD-10-CM | POA: Diagnosis present

## 2023-06-15 DIAGNOSIS — C771 Secondary and unspecified malignant neoplasm of intrathoracic lymph nodes: Secondary | ICD-10-CM | POA: Diagnosis present

## 2023-06-15 DIAGNOSIS — R1310 Dysphagia, unspecified: Secondary | ICD-10-CM

## 2023-06-15 DIAGNOSIS — Z87442 Personal history of urinary calculi: Secondary | ICD-10-CM

## 2023-06-15 DIAGNOSIS — Z95828 Presence of other vascular implants and grafts: Secondary | ICD-10-CM

## 2023-06-15 DIAGNOSIS — E119 Type 2 diabetes mellitus without complications: Secondary | ICD-10-CM

## 2023-06-15 DIAGNOSIS — C329 Malignant neoplasm of larynx, unspecified: Secondary | ICD-10-CM | POA: Diagnosis present

## 2023-06-15 DIAGNOSIS — Z888 Allergy status to other drugs, medicaments and biological substances status: Secondary | ICD-10-CM

## 2023-06-15 DIAGNOSIS — Z923 Personal history of irradiation: Secondary | ICD-10-CM

## 2023-06-15 DIAGNOSIS — Z79899 Other long term (current) drug therapy: Secondary | ICD-10-CM

## 2023-06-15 DIAGNOSIS — Z9221 Personal history of antineoplastic chemotherapy: Secondary | ICD-10-CM

## 2023-06-15 DIAGNOSIS — Z803 Family history of malignant neoplasm of breast: Secondary | ICD-10-CM

## 2023-06-15 DIAGNOSIS — G2581 Restless legs syndrome: Secondary | ICD-10-CM | POA: Diagnosis present

## 2023-06-15 DIAGNOSIS — E782 Mixed hyperlipidemia: Secondary | ICD-10-CM | POA: Diagnosis present

## 2023-06-15 DIAGNOSIS — Z8249 Family history of ischemic heart disease and other diseases of the circulatory system: Secondary | ICD-10-CM

## 2023-06-15 DIAGNOSIS — Z833 Family history of diabetes mellitus: Secondary | ICD-10-CM

## 2023-06-15 DIAGNOSIS — E860 Dehydration: Secondary | ICD-10-CM | POA: Diagnosis present

## 2023-06-15 DIAGNOSIS — Z79620 Long term (current) use of immunosuppressive biologic: Secondary | ICD-10-CM

## 2023-06-15 DIAGNOSIS — C7951 Secondary malignant neoplasm of bone: Secondary | ICD-10-CM | POA: Diagnosis present

## 2023-06-15 HISTORY — DX: Dyspnea, unspecified: R06.00

## 2023-06-15 LAB — COMPREHENSIVE METABOLIC PANEL
ALT: 12 U/L (ref 0–44)
AST: 12 U/L — ABNORMAL LOW (ref 15–41)
Albumin: 3.5 g/dL (ref 3.5–5.0)
Alkaline Phosphatase: 76 U/L (ref 38–126)
Anion gap: 11 (ref 5–15)
BUN: 18 mg/dL (ref 8–23)
CO2: 27 mmol/L (ref 22–32)
Calcium: 9.5 mg/dL (ref 8.9–10.3)
Chloride: 98 mmol/L (ref 98–111)
Creatinine, Ser: 1.26 mg/dL — ABNORMAL HIGH (ref 0.61–1.24)
GFR, Estimated: >60 mL/min (ref >60)
Glucose, Bld: 136 mg/dL — ABNORMAL HIGH (ref 70–99)
Potassium: 3.9 mmol/L (ref 3.5–5.1)
Sodium: 136 mmol/L (ref 135–145)
Total Bilirubin: 0.7 mg/dL (ref 0.0–1.2)
Total Protein: 8.7 g/dL — ABNORMAL HIGH (ref 6.5–8.1)

## 2023-06-15 LAB — CBC
HCT: 27.8 % — ABNORMAL LOW (ref 39.0–52.0)
Hemoglobin: 8.2 g/dL — ABNORMAL LOW (ref 13.0–17.0)
MCH: 24.7 pg — ABNORMAL LOW (ref 26.0–34.0)
MCHC: 29.5 g/dL — ABNORMAL LOW (ref 30.0–36.0)
MCV: 83.7 fL (ref 80.0–100.0)
Platelets: 540 10*3/uL — ABNORMAL HIGH (ref 150–400)
RBC: 3.32 MIL/uL — ABNORMAL LOW (ref 4.22–5.81)
RDW: 13.6 % (ref 11.5–15.5)
WBC: 9.5 10*3/uL (ref 4.0–10.5)
nRBC: 0 % (ref 0.0–0.2)

## 2023-06-15 LAB — RESP PANEL BY RT-PCR (RSV, FLU A&B, COVID)  RVPGX2
Influenza A by PCR: NEGATIVE
Influenza B by PCR: NEGATIVE
Resp Syncytial Virus by PCR: NEGATIVE
SARS Coronavirus 2 by RT PCR: NEGATIVE

## 2023-06-15 LAB — TROPONIN I (HIGH SENSITIVITY): Troponin I (High Sensitivity): 9 ng/L (ref <18)

## 2023-06-15 LAB — LACTIC ACID, PLASMA: Lactic Acid, Venous: 0.7 mmol/L (ref 0.5–1.9)

## 2023-06-15 MED ORDER — SODIUM CHLORIDE 0.9 % IV SOLN
2.0000 g | Freq: Three times a day (TID) | INTRAVENOUS | Status: DC
  Administered 2023-06-15 – 2023-06-18 (×8): 2 g via INTRAVENOUS
  Filled 2023-06-15 (×9): qty 12.5

## 2023-06-15 MED ORDER — IOHEXOL 350 MG/ML SOLN
75.0000 mL | Freq: Once | INTRAVENOUS | Status: AC | PRN
  Administered 2023-06-15: 75 mL via INTRAVENOUS

## 2023-06-15 MED ORDER — SODIUM CHLORIDE 0.9 % IV BOLUS
1000.0000 mL | Freq: Once | INTRAVENOUS | Status: AC
  Administered 2023-06-15: 1000 mL via INTRAVENOUS

## 2023-06-15 MED ORDER — VANCOMYCIN HCL 2000 MG/400ML IV SOLN
2000.0000 mg | Freq: Once | INTRAVENOUS | Status: AC
  Administered 2023-06-15: 2000 mg via INTRAVENOUS
  Filled 2023-06-15: qty 400

## 2023-06-15 NOTE — ED Triage Notes (Signed)
 Pt here from home with c/o difficulty swallowing , pt has know throat ca and was told his needs it stretched , pt has appointment but not until march , has been unable to keep fluids down

## 2023-06-15 NOTE — ED Provider Notes (Signed)
EMERGENCY DEPARTMENT AT [facility] Provider Note   CSN: [id number] Arrival date & time: 06/15/23  1607     History  Chief Complaint  Patient presents with   Dysphagia    Jesus [name] is a 68 y.o. male.  The history is provided by the patient, medical records and a relative. No language interpreter was used.  Cough Cough characteristics:  Productive Sputum characteristics:  Bloody Severity:  Moderate Onset quality:  Gradual Duration:  1 week Timing:  Constant Progression:  Worsening Chronicity:  Recurrent Relieved by:  Nothing Worsened by:  Nothing Ineffective treatments:  None tried Associated symptoms: shortness of breath   Associated symptoms: no chest pain, no chills, no fever, no headaches, no rash and no wheezing   Shortness of breath:    Severity:  Moderate   Onset quality:  Gradual   Timing:  Constant   Progression:  Waxing and waning      Home Medications Prior to Admission medications   Medication Sig Start Date End Date Taking? Authorizing Provider  ACCU-CHEK GUIDE test strip 1 each by Other route as needed for other. 01/05/23   [provider]  Accu-Chek Softclix Lancets lancets  01/05/23   [provider]  acetaminophen  (TYLENOL ) 500 MG tablet Take 1,500 mg by mouth daily as needed for moderate pain.    [provider]  amLODipine  (NORVASC ) 10 MG tablet TAKE 1 TABLET BY MOUTH EVERY DAY 02/19/20   Benjiman Bras, MD  AREXVY 120 MCG/0.5ML injection  01/15/23   [provider]  aspirin  EC 81 MG tablet Take 81 mg by mouth every evening. Swallow whole.    [provider]  azithromycin  (ZITHROMAX ) 200 MG/5ML suspension 12.5 milliliters x 1 day, then 6 milliliters daily x 4 days Orally Once a day for 5 days 06/07/23   [provider]  b complex vitamins capsule Take 1 capsule by mouth daily.    [provider]  lidocaine  (XYLOCAINE ) 2 % solution Patient: Mix 1part 2% viscous  lidocaine , 1part H20. Swallow 10mL of diluted mixture, before meals and at bedtime, up to QID to help sore throat Patient not taking: Reported on 05/19/2023 12/21/22   Colie Dawes, MD  lidocaine -prilocaine  (EMLA ) cream Apply to affected area once 08/04/22   Kale, Gautam Kishore, MD  metFORMIN (GLUCOPHAGE) 500 MG tablet Take 500 mg by mouth 2 (two) times daily with a meal. 01/20/22   [provider]  Multiple Vitamins-Minerals (MULTI ADULT GUMMIES PO) Take 1 tablet by mouth in the morning. Centrum    [provider]  nystatin  (MYCOSTATIN ) 100000 UNIT/ML suspension Take 5 mLs (500,000 Units total) by mouth 4 (four) times daily. 05/19/23   Raspet, Erin K, PA-C  oxyCODONE  (OXY IR/ROXICODONE ) 5 MG immediate release tablet Take 1 tablet (5 mg total) by mouth every 6 (six) hours as needed for moderate pain or breakthrough pain. 07/22/22   Zelphia Higashi, MD  prochlorperazine  (COMPAZINE ) 10 MG tablet Take 1 tablet (10 mg total) by mouth every 6 (six) hours as needed for nausea or vomiting. 08/04/22   Kale, Gautam Kishore, MD  sildenafil (VIAGRA) 100 MG tablet 1 tablet as needed Orally Once a day for 30 days 06/07/23   [provider]      Allergies    Bee venom, Lisinopril , Antifungal [miconazole nitrate], and Zolpidem tartrate er    Review of Systems   Review of Systems  Constitutional:  Negative for chills and fever.  HENT:  Positive for drooling and trouble swallowing.   Respiratory:  Positive for cough and shortness of breath. Negative for chest tightness and wheezing.   Cardiovascular:  Negative for chest pain and leg swelling.  Gastrointestinal:  Negative for abdominal pain, constipation, diarrhea, nausea and vomiting.  Genitourinary:  Negative for dysuria.  Musculoskeletal:  Negative for back pain, neck pain and neck stiffness.  Skin:  Negative for rash and wound.  Neurological:  Negative for light-headedness and headaches.  Psychiatric/Behavioral:  Negative  for agitation and confusion.     Physical Exam Updated Vital Signs BP 131/72 (BP Location: Left Arm)   Pulse (!) 110   Temp 99.9 F (37.7 C) (Oral)   Resp 18   SpO2 99%  Physical Exam Vitals and nursing note reviewed.  Constitutional:      General: He is not in acute distress.    Appearance: He is well-developed. He is not ill-appearing, toxic-appearing or diaphoretic.  HENT:     Head: Normocephalic and atraumatic.     Nose: Nose normal.     Mouth/Throat:     Mouth: Mucous membranes are dry.     Pharynx: No oropharyngeal exudate or posterior oropharyngeal erythema.  Eyes:     Extraocular Movements: Extraocular movements intact.     Conjunctiva/sclera: Conjunctivae normal.     Pupils: Pupils are equal, round, and reactive to light.  Neck:     Vascular: No carotid bruit.  Cardiovascular:     Rate and Rhythm: Regular rhythm. Tachycardia present.     Heart sounds: No murmur heard. Pulmonary:     Effort: Pulmonary effort is normal. No respiratory distress.     Breath sounds: Normal breath sounds. No wheezing, rhonchi or rales.  Chest:     Chest wall: No tenderness.  Abdominal:     General: Abdomen is flat.     Palpations: Abdomen is soft.     Tenderness: There is no abdominal tenderness. There is no guarding or rebound.  Musculoskeletal:        General: No swelling or tenderness.     Cervical back: No tenderness.     Right lower leg: Edema present.     Left lower leg: Edema present.  Skin:    General: Skin is warm and dry.     Capillary Refill: Capillary refill takes less than 2 seconds.     Findings: No erythema or rash.  Neurological:     General: No focal deficit present.     Mental Status: He is alert.     Sensory: No sensory deficit.     Motor: No weakness.  Psychiatric:        Mood and Affect: Mood normal.     ED Results / Procedures / Treatments   Labs (all labs ordered are listed, but only abnormal results are displayed) Labs Reviewed  CBC - Abnormal;  Notable for the following components:      Result Value   RBC 3.32 (*)    Hemoglobin 8.2 (*)    HCT 27.8 (*)    MCH 24.7 (*)    MCHC 29.5 (*)    Platelets 540 (*)    All other components within normal limits  COMPREHENSIVE METABOLIC PANEL - Abnormal; Notable for the following components:   Glucose, Bld 136 (*)    Creatinine, Ser 1.26 (*)    Total Protein 8.7 (*)    AST 12 (*)    All other components within normal limits  RESP PANEL BY RT-PCR (RSV, FLU  A&B, COVID)  [id number]  LACTIC ACID, PLASMA  TROPONIN I (HIGH SENSITIVITY)    EKG EKG Interpretation Date/Time:  Thursday June 15 2023 19:45:25 EST Ventricular Rate:  114 PR Interval:  127 QRS Duration:  156 QT Interval:  371 QTC Calculation: 511 R Axis:   -82  Text Interpretation: Sinus tachycardia Atrial premature complexes 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 No STEMI Confirmed by Wynell Heath ([id number]) on 06/15/2023 7:52:01 PM  Radiology CT Soft Tissue Neck W Contrast Result Date: 06/15/2023 CLINICAL DATA:  Shortness of breath with hemoptysis EXAM: CT NECK WITH CONTRAST TECHNIQUE: Multidetector CT imaging of the neck was performed using the standard protocol following the bolus administration of intravenous contrast. RADIATION DOSE REDUCTION: This exam was performed according to the departmental dose-optimization program which includes automated exposure control, adjustment of the mA and/or kV according to patient size and/or use of iterative reconstruction technique. CONTRAST:  75mL OMNIPAQUE  IOHEXOL  350 MG/ML SOLN COMPARISON:  05/05/2022 CT neck 04/14/2023 PET CT FINDINGS: Pharynx and larynx: At the level of the lower glottis and inferior aspect of the thyroid  gland, there is circumferential soft tissue abnormality surrounding the trachea and posteriorly displacing the esophagus. This masslike region measures approximately 4.0 x 4.2 x 5.2 cm. The mass appears more infiltrative of the surrounding  tissues with somewhat indistinct margins suggestive of inflammation. Salivary glands: No inflammation, mass, or stone. Thyroid : Above described soft tissue abnormality at the level of the thoracic inlet is continuous with the lower left thyroid  lobe. This causes marked rightward deviation of the trachea, which remains patent. Lymph nodes: Previously visualized enlarged lymph nodes of the right cervical chain have resolved. Unchanged left level 3 node measuring 6 mm (2:67). No enlarged or abnormal density nodes. Vascular: Right IJ approach central venous catheter with tip below the field of view (Port-A-Cath), with access needle. Limited intracranial: Normal Visualized orbits: Normal Mastoids and visualized paranasal sinuses: Clear. Skeleton: No acute or aggressive process. Upper chest: Negative. Other: None. IMPRESSION: 1. Circumferential soft tissue abnormality surrounding the trachea and posteriorly displacing the esophagus at the level of the thoracic inlet, worsened since 04/14/2023 and consistent with progression of glottic squamous cell carcinoma. 2. Area inflammatory change associated with the above mass is likely a post radiation effect. 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 3. Previously visualized enlarged lymph nodes of the right cervical chain have resolved. Electronically Signed   By: Juanetta Nordmann M.D.   On: 06/15/2023 23:01   CT Angio Chest PE W and/or Wo Contrast Result Date: 06/15/2023 CLINICAL DATA:  Pulmonary embolus suspected with high probability. Shortness of breath, tachycardia, productive cough with hemoptysis. Cancer. Difficulty swallowing. Known throat cancer. EXAM: CT ANGIOGRAPHY CHEST WITH CONTRAST TECHNIQUE: Multidetector CT imaging of the chest was performed using the standard protocol during bolus administration of intravenous contrast. Multiplanar CT image reconstructions and MIPs were obtained to evaluate the vascular anatomy. RADIATION DOSE  REDUCTION: This exam was performed according to the departmental dose-optimization program which includes automated exposure control, adjustment of the mA and/or kV according to patient size and/or use of iterative reconstruction technique. CONTRAST:  75mL OMNIPAQUE  IOHEXOL  350 MG/ML SOLN COMPARISON:  Chest radiograph 06/15/2023. PET-CT 04/14/2023. CT chest 05/05/2022 FINDINGS: Cardiovascular: Technically adequate study with good opacification of the central and segmental pulmonary arteries. Mild motion artifact. No focal filling defects. No evidence of significant pulmonary embolus. Normal heart size. No pericardial effusions. Normal caliber thoracic aorta. No aortic dissection. [employer]  vessel origins are patent. Mediastinum/Nodes: Right central venous catheter with tip at the cavoatrial junction. Eccentric esophageal or paraesophageal mass at the thoracic inlet measuring about 3.6 cm diameter. This displaces the trachea towards the right. This is consistent with known esophageal neoplasm. The lateral wall of the trachea is thickened and irregular, likely indicating direct invasion of the trachea. The distal esophagus is decompressed. Mildly enlarged mediastinal lymph nodes with pretracheal nodes measuring up to 10 mm short axis dimension. These are nonspecific. Lungs/Pleura: Patchy perihilar infiltrates could be due to motion artifact or may indicate mild edema or pneumonia. Bandlike opacities in the left lung base likely representing scarring. No pleural effusion or pneumothorax. Upper Abdomen: No acute abnormalities. Renal cysts, largest on the left measuring 5.7 cm diameter. No imaging follow-up is indicated. Musculoskeletal: Degenerative changes in the spine. No destructive bone lesions. Review of the MIP images confirms the above findings. IMPRESSION: 1. Esophageal mass of the level of the thoracic inlet, displacing the trachea. This likely corresponds to known cancer. The adjacent tracheal wall is irregular,  likely representing direct tumor invasion. Nonspecific mediastinal lymph nodes could be metastatic. 2. No evidence of significant pulmonary embolus. 3. Patchy perihilar infiltrates may represent motion artifact, edema, or pneumonia. Electronically Signed   By: Boyce Byes M.D.   On: 06/15/2023 21:43   DG Chest 2 View Result Date: 06/15/2023 CLINICAL DATA:  Neck pain difficulty swallowing EXAM: CHEST - 2 VIEW COMPARISON:  08/16/2022 FINDINGS: Right-sided central venous port tip at the SVC. No acute airspace disease or effusion. Normal cardiac size. Rightward tracheal deviation with left paratracheal opacity. IMPRESSION: 1. No active cardiopulmonary disease. 2. Rightward tracheal deviation with left paratracheal opacity,, presumably corresponding to mass seen on prior PET CT. Electronically Signed   By: Esmeralda Hedge M.D.   On: 06/15/2023 20:12   DG Neck Soft Tissue Result Date: 06/15/2023 CLINICAL DATA:  Neck pain difficulty swallowing EXAM: NECK SOFT TISSUES - 1+ VIEW COMPARISON:  Esophagram 06/09/2023, swallow studies 05/25/2023, 04/14/2023 FINDINGS: Normal prevertebral soft tissue thickness. No retropharyngeal thickening or gas. Slightly thickened appearance of the epiglottis and slightly hazy appearance of the glottic area. There may be slight narrowing of the subglottic trachea. Suspect similar appearance on recent swallowing exams. IMPRESSION: Slightly thickened appearance of the epiglottis and slightly hazy appearance of the glottic area suggesting mild edema or inflammation. Consider correlation with direct visualization. Possible mild narrowing of the subglottic trachea as well. Electronically Signed   By: Esmeralda Hedge M.D.   On: 06/15/2023 20:10    Procedures Procedures      Medications Ordered in ED Medications  vancomycin  (VANCOREADY) IVPB 2000 mg/400 mL (has no administration in time range)  ceFEPIme  (MAXIPIME ) 2 g in sodium chloride  0.9 % 100 mL IVPB (has no administration in time  range)  sodium chloride  0.9 % bolus 1,000 mL (has no administration in time range)  sodium chloride  0.9 % bolus 1,000 mL (0 mLs Intravenous Stopped 06/15/23 2224)  iohexol  (OMNIPAQUE ) 350 MG/ML injection 75 mL (75 mLs Intravenous Contrast Given 06/15/23 2116)    ED Course/ Medical Decision Making/ A&P                                 Medical Decision Making Amount and/or Complexity of Data Reviewed Labs: ordered. Radiology: ordered.  Risk Prescription drug management. Decision regarding hospitalization.    Jesus [name] is a 68 y.o. male with a past medical  history significant for hypertension, hyperlipidemia, previous kidney stones, and lymphoma with worsening dysphagia presents with worsening dysphagia with now inability to swallow solids, liquids, or even some secretions, worsened cough with hemoptysis, and exertional shortness of breath.  Patient says that he saw his oncologist several days ago and in the last 3 days he is now no longer tolerating fluids which he was before and is not having to spit up all of his own spit.  He reports he is otherwise not having nausea or vomiting but is coughing more and coughing up blood clots.  He reports he is not having any chest pain but has more fatigue and exertional shortness of breath.  He has had some chills but denies any documented fevers.  He did not report any sick contacts.  Denies any constipation, diarrhea or urinary changes.  He said he is not making as many bowel movements due to lack of oral intake.  Denies any trauma.  He is denying worsened throat pain at this time.  He says he is post to have an esophageal stretching procedure next month but he is worried about his worsened intake and fatigue.  On my exam, I did not appreciate stridor but he does appear slightly shortness of breath.  He is tachycardic.  He did not have tenderness on his neck on my exam and chest was nontender.  Abdomen nontender.  He has good pulse in extremities.  Mild  edema in legs that he reports is chronic.  Clinically concerned that his neck mass has worsened now leading to more obstruction and inability to tolerate p.o.  With his tachycardia I am worried he is getting more dehydrated and will need IV fluids and admission.  He had an x-ray in triage of the neck and chest and did not show pneumonia but showed some possible epiglottis inflammation.  Will get CT with contrast to look for epiglottitis versus changing of his mass.  Will also get a CT PE study given his cancer history, hemoptysis, and shortness of breath with tachycardia.  Will also check in for COVID/flu/RSV given the burden in the community currently.  Will give him some fluids.  Anticipate admission after workup is completed.  11:03 PM CT scan did not show pulmonary bolus and but shows evidence of likely pneumonia and evidence that his tumor is eroding into the trachea.  Still waiting on the results of the CT soft tissue neck however will call for admission given the pneumonia failing outpatient oral antibiotics that he is not now able to orally take antibiotics as well.  I spoke to pharmacy who will order Vanco and cefepime  to escalate coverage.  Patient agrees with plan for admission and will admit for further management of pneumonia and worsening dysphagia.  Anticipate he may end up needing ENT and oncology consultation during the daytime to discuss next steps.          Final Clinical Impression(s) / ED Diagnoses Final diagnoses:  Pneumonia due to infectious organism, unspecified laterality, unspecified part of lung  Dysphagia, unspecified type  Dehydration    Clinical Impression: 1. Pneumonia due to infectious organism, unspecified laterality, unspecified part of lung   2. Dysphagia, unspecified type   3. Dehydration     Disposition: Admit  This note was prepared with assistance of Dragon voice recognition software. Occasional wrong-word or sound-a-like substitutions may have  occurred due to the inherent limitations of voice recognition software.       Zela Sobieski, Marine Sia, MD 06/15/23  2334  

## 2023-06-15 NOTE — ED Provider Triage Note (Signed)
 Emergency Medicine Provider Triage Evaluation Note  Jesus [name] , a 68 y.o. male  was evaluated in triage.  Pt complains of difficulty swallowing since Monday. He is a throat cancer pt follows w ENT. He he states that he has had difficulty swallowing solid food he has been able to swallow liquid and he has been managing secretions no significant coughing episodes but does endorse sore throat and some bodyaches and fatigue states he is more short of breath with exertion than he normally is, no CP no hemoptysis.     Review of Systems  Positive: SOB, dysphagea  Negative: Fever   Physical Exam  BP 119/74 (BP Location: Right Arm)   Pulse (!) 110   Temp 99.9 F (37.7 C) (Oral)   Resp 18   SpO2 98%  Gen:   Awake, no distress   Resp:  Normal effort  MSK:   Moves extremities without difficulty  Other:  Lungs clear, tachycardia, throat nml in appearance.   Medical Decision Making  Medically screening exam initiated at 6:50 PM.  Appropriate orders placed.  RATHANA VIVEROS was informed that the remainder of the evaluation will be completed by another provider, this initial triage assessment does not replace that evaluation, and the importance of remaining in the ED until their evaluation is complete.  Labs, xray, covid/flu/strep.   Ofelia Bent McCool, [place] 06/15/23 [phone]

## 2023-06-16 ENCOUNTER — Other Ambulatory Visit: Payer: Self-pay

## 2023-06-16 DIAGNOSIS — Z1152 Encounter for screening for COVID-19: Secondary | ICD-10-CM | POA: Diagnosis not present

## 2023-06-16 DIAGNOSIS — C7951 Secondary malignant neoplasm of bone: Secondary | ICD-10-CM | POA: Diagnosis not present

## 2023-06-16 DIAGNOSIS — C329 Malignant neoplasm of larynx, unspecified: Secondary | ICD-10-CM | POA: Diagnosis not present

## 2023-06-16 DIAGNOSIS — R1310 Dysphagia, unspecified: Secondary | ICD-10-CM

## 2023-06-16 DIAGNOSIS — E860 Dehydration: Secondary | ICD-10-CM

## 2023-06-16 DIAGNOSIS — C159 Malignant neoplasm of esophagus, unspecified: Secondary | ICD-10-CM | POA: Diagnosis not present

## 2023-06-16 DIAGNOSIS — Z95828 Presence of other vascular implants and grafts: Secondary | ICD-10-CM | POA: Diagnosis not present

## 2023-06-16 DIAGNOSIS — I10 Essential (primary) hypertension: Secondary | ICD-10-CM | POA: Diagnosis not present

## 2023-06-16 DIAGNOSIS — Z6832 Body mass index (BMI) 32.0-32.9, adult: Secondary | ICD-10-CM | POA: Diagnosis not present

## 2023-06-16 DIAGNOSIS — C771 Secondary and unspecified malignant neoplasm of intrathoracic lymph nodes: Secondary | ICD-10-CM | POA: Diagnosis not present

## 2023-06-16 DIAGNOSIS — E669 Obesity, unspecified: Secondary | ICD-10-CM | POA: Diagnosis not present

## 2023-06-16 DIAGNOSIS — R1313 Dysphagia, pharyngeal phase: Secondary | ICD-10-CM | POA: Diagnosis not present

## 2023-06-16 DIAGNOSIS — R042 Hemoptysis: Secondary | ICD-10-CM | POA: Diagnosis not present

## 2023-06-16 DIAGNOSIS — D849 Immunodeficiency, unspecified: Secondary | ICD-10-CM | POA: Diagnosis not present

## 2023-06-16 DIAGNOSIS — E1151 Type 2 diabetes mellitus with diabetic peripheral angiopathy without gangrene: Secondary | ICD-10-CM | POA: Diagnosis not present

## 2023-06-16 DIAGNOSIS — J189 Pneumonia, unspecified organism: Secondary | ICD-10-CM

## 2023-06-16 DIAGNOSIS — R000 Tachycardia, unspecified: Secondary | ICD-10-CM | POA: Diagnosis not present

## 2023-06-16 DIAGNOSIS — K9429 Other complications of gastrostomy: Secondary | ICD-10-CM | POA: Diagnosis not present

## 2023-06-16 DIAGNOSIS — E43 Unspecified severe protein-calorie malnutrition: Secondary | ICD-10-CM | POA: Diagnosis not present

## 2023-06-16 DIAGNOSIS — E119 Type 2 diabetes mellitus without complications: Secondary | ICD-10-CM

## 2023-06-16 DIAGNOSIS — C4492 Squamous cell carcinoma of skin, unspecified: Secondary | ICD-10-CM

## 2023-06-16 DIAGNOSIS — R1314 Dysphagia, pharyngoesophageal phase: Secondary | ICD-10-CM | POA: Diagnosis not present

## 2023-06-16 DIAGNOSIS — G2581 Restless legs syndrome: Secondary | ICD-10-CM | POA: Diagnosis not present

## 2023-06-16 DIAGNOSIS — C320 Malignant neoplasm of glottis: Secondary | ICD-10-CM | POA: Diagnosis not present

## 2023-06-16 DIAGNOSIS — Z87891 Personal history of nicotine dependence: Secondary | ICD-10-CM | POA: Diagnosis not present

## 2023-06-16 DIAGNOSIS — J398 Other specified diseases of upper respiratory tract: Secondary | ICD-10-CM | POA: Diagnosis not present

## 2023-06-16 DIAGNOSIS — J449 Chronic obstructive pulmonary disease, unspecified: Secondary | ICD-10-CM | POA: Diagnosis not present

## 2023-06-16 DIAGNOSIS — N179 Acute kidney failure, unspecified: Secondary | ICD-10-CM | POA: Diagnosis not present

## 2023-06-16 DIAGNOSIS — K219 Gastro-esophageal reflux disease without esophagitis: Secondary | ICD-10-CM | POA: Diagnosis not present

## 2023-06-16 DIAGNOSIS — C140 Malignant neoplasm of pharynx, unspecified: Secondary | ICD-10-CM

## 2023-06-16 DIAGNOSIS — D630 Anemia in neoplastic disease: Secondary | ICD-10-CM | POA: Diagnosis not present

## 2023-06-16 DIAGNOSIS — T85528A Displacement of other gastrointestinal prosthetic devices, implants and grafts, initial encounter: Secondary | ICD-10-CM | POA: Diagnosis not present

## 2023-06-16 DIAGNOSIS — C8338 Diffuse large B-cell lymphoma, lymph nodes of multiple sites: Secondary | ICD-10-CM | POA: Diagnosis not present

## 2023-06-16 LAB — CBC WITH DIFFERENTIAL/PLATELET
Abs Immature Granulocytes: 0.02 10*3/uL (ref 0.00–0.07)
Basophils Absolute: 0.1 10*3/uL (ref 0.0–0.1)
Basophils Relative: 1 %
Eosinophils Absolute: 0.1 10*3/uL (ref 0.0–0.5)
Eosinophils Relative: 1 %
HCT: 23.8 % — ABNORMAL LOW (ref 39.0–52.0)
Hemoglobin: 7.2 g/dL — ABNORMAL LOW (ref 13.0–17.0)
Immature Granulocytes: 0 %
Lymphocytes Relative: 22 %
Lymphs Abs: 1.6 10*3/uL (ref 0.7–4.0)
MCH: 25.9 pg — ABNORMAL LOW (ref 26.0–34.0)
MCHC: 30.3 g/dL (ref 30.0–36.0)
MCV: 85.6 fL (ref 80.0–100.0)
Monocytes Absolute: 0.7 10*3/uL (ref 0.1–1.0)
Monocytes Relative: 9 %
Neutro Abs: 5 10*3/uL (ref 1.7–7.7)
Neutrophils Relative %: 67 %
Platelets: 469 10*3/uL — ABNORMAL HIGH (ref 150–400)
RBC: 2.78 MIL/uL — ABNORMAL LOW (ref 4.22–5.81)
RDW: 13.7 % (ref 11.5–15.5)
WBC: 7.4 10*3/uL (ref 4.0–10.5)
nRBC: 0 % (ref 0.0–0.2)

## 2023-06-16 LAB — BASIC METABOLIC PANEL
Anion gap: 10 (ref 5–15)
BUN: 16 mg/dL (ref 8–23)
CO2: 24 mmol/L (ref 22–32)
Calcium: 8.6 mg/dL — ABNORMAL LOW (ref 8.9–10.3)
Chloride: 101 mmol/L (ref 98–111)
Creatinine, Ser: 0.98 mg/dL (ref 0.61–1.24)
GFR, Estimated: >60 mL/min (ref >60)
Glucose, Bld: 119 mg/dL — ABNORMAL HIGH (ref 70–99)
Potassium: 3.8 mmol/L (ref 3.5–5.1)
Sodium: 135 mmol/L (ref 135–145)

## 2023-06-16 LAB — TSH: TSH: 4.054 u[IU]/mL (ref 0.350–4.500)

## 2023-06-16 LAB — MRSA NEXT GEN BY PCR, NASAL: MRSA by PCR Next Gen: NOT DETECTED

## 2023-06-16 MED ORDER — VANCOMYCIN HCL IN DEXTROSE 1-5 GM/200ML-% IV SOLN
1000.0000 mg | Freq: Two times a day (BID) | INTRAVENOUS | Status: DC
  Administered 2023-06-16 – 2023-06-18 (×5): 1000 mg via INTRAVENOUS
  Filled 2023-06-16 (×5): qty 200

## 2023-06-16 MED ORDER — GLYCOPYRROLATE 0.2 MG/ML IJ SOLN: 0.2000 mg | INTRAMUSCULAR | Status: DC | PRN

## 2023-06-16 MED ORDER — ONDANSETRON HCL 4 MG PO TABS: 4.0000 mg | ORAL_TABLET | Freq: Four times a day (QID) | ORAL | Status: DC | PRN

## 2023-06-16 MED ORDER — ACETAMINOPHEN 325 MG PO TABS: 650.0000 mg | ORAL_TABLET | Freq: Four times a day (QID) | ORAL | Status: DC | PRN

## 2023-06-16 MED ORDER — VANCOMYCIN HCL 1500 MG/300ML IV SOLN: 1500.0000 mg | INTRAVENOUS | Status: DC

## 2023-06-16 MED ORDER — SODIUM CHLORIDE 0.9% FLUSH
10.0000 mL | Freq: Two times a day (BID) | INTRAVENOUS | Status: DC
  Administered 2023-06-19 – 2023-06-24 (×6): 10 mL

## 2023-06-16 MED ORDER — CHLORHEXIDINE GLUCONATE CLOTH 2 % EX PADS
6.0000 | MEDICATED_PAD | Freq: Every day | CUTANEOUS | Status: DC
  Administered 2023-06-16 – 2023-06-24 (×9): 6 via TOPICAL

## 2023-06-16 MED ORDER — HYDROCODONE BIT-HOMATROP MBR 5-1.5 MG/5ML PO SOLN: 5.0000 mL | ORAL | Status: DC | PRN

## 2023-06-16 MED ORDER — GLYCOPYRROLATE 0.2 MG/ML IJ SOLN
0.2000 mg | INTRAMUSCULAR | Status: DC | PRN
  Administered 2023-06-16 – 2023-06-23 (×11): 0.2 mg via INTRAVENOUS
  Filled 2023-06-16 (×11): qty 1

## 2023-06-16 MED ORDER — HYDROMORPHONE HCL 1 MG/ML IJ SOLN
0.5000 mg | INTRAMUSCULAR | Status: DC | PRN
  Administered 2023-06-19: 0.5 mg via INTRAVENOUS
  Filled 2023-06-16: qty 0.5

## 2023-06-16 MED ORDER — ONDANSETRON HCL 4 MG/2ML IJ SOLN
4.0000 mg | Freq: Four times a day (QID) | INTRAMUSCULAR | Status: DC | PRN
Start: 2023-06-16 — End: 2023-06-24

## 2023-06-16 MED ORDER — GUAIFENESIN 100 MG/5ML PO LIQD: 5.0000 mL | ORAL | Status: DC | PRN

## 2023-06-16 MED ORDER — POTASSIUM CHLORIDE IN NACL 20-0.9 MEQ/L-% IV SOLN
INTRAVENOUS | Status: AC
  Filled 2023-06-16 (×3): qty 1000

## 2023-06-16 MED ORDER — GLYCOPYRROLATE 1 MG PO TABS: 1.0000 mg | ORAL_TABLET | ORAL | Status: DC | PRN

## 2023-06-16 MED ORDER — SODIUM CHLORIDE 0.9% FLUSH: 10.0000 mL | INTRAVENOUS | Status: DC | PRN

## 2023-06-16 MED ORDER — ACETAMINOPHEN 650 MG RE SUPP: 650.0000 mg | Freq: Four times a day (QID) | RECTAL | Status: DC | PRN

## 2023-06-16 NOTE — Progress Notes (Signed)
 Pharmacy Antibiotic Note  Jesus [name] is a 68 y.o. male admitted on 06/15/2023 with dysphagia, pt has hx of throat cancer.  CT scan did not show pulmonary bolus and but shows evidence of likely pneumonia and evidence that his tumor is eroding into the trachea. Pharmacy has been consulted for vanc and cefepime  dosing.  Plan: Vancomycin  2gm IV x 1 then 1500 q24h (AUC 490.8, Scr 1.26) Cefepime  2gm Iv q8h Follow renal function, cultures and clinical course     Temp (24hrs), Avg:99.3 F (37.4 C), Min:98.6 F (37 C), Max:99.9 F (37.7 C)  Recent Labs  Lab 06/13/23 1452 06/15/23 1916 06/15/23 2034  WBC 8.1 9.5  --   CREATININE 1.20 1.26*  --   LATICACIDVEN  --   --  0.7    Estimated Creatinine Clearance: 72.4 mL/min (A) (by C-G formula based on SCr of 1.26 mg/dL (H)).    Allergies  Allergen Reactions   Bee Venom Anaphylaxis   Lisinopril  Other (See Comments)    Swelling in face   Antifungal [Miconazole Nitrate] Other (See Comments)     made his head hurt   Zolpidem Tartrate Er Other (See Comments)    Leg cramps    Antimicrobials this admission: 2/7 vanc>> 2/7 cefepime  >>  Dose adjustments this admission:   Microbiology results:   Thank you for allowing pharmacy to be a part of this patient's care.  Beau Bound RPh 06/16/2023, 1:36 AM

## 2023-06-16 NOTE — Progress Notes (Signed)
 Pharmacy Antibiotic Note  Jesus [name] is a 68 y.o. male admitted on 06/15/2023 with dysphagia, history of metastatic laryngeal SCC. CT concerning for pneumonia and showing esophageal and tracheal deviation. Pharmacy has been consulted for vancomycin  and cefepime  dosing for pneumonia.  Plan: -Change vancomycin  to 1000 mg IV q12h -Continue cefepime  2 gm IV q8h -Continue to follow renal function, cultures and clinical progress for dose adjustments and de-escalation as indicated  Height: 6' (182.9 cm) Weight: 108.4 kg (239 lb) IBW/kg (Calculated) : 77.6  Temp (24hrs), Avg:98.6 F (37 C), Min:97.9 F (36.6 C), Max:99.9 F (37.7 C)  Recent Labs  Lab 06/13/23 1452 06/15/23 1916 06/15/23 2034 06/16/23 0422  WBC 8.1 9.5  --  7.4  CREATININE 1.20 1.26*  --  0.98  LATICACIDVEN  --   --  0.7  --     Estimated Creatinine Clearance: 93 mL/min (by C-G formula based on SCr of 0.98 mg/dL).    Allergies  Allergen Reactions   Bee Venom Anaphylaxis   Lisinopril  Other (See Comments)    Swelling in face   Antifungal [Miconazole Nitrate] Other (See Comments)     made his head hurt   Zolpidem Tartrate Er Other (See Comments)    Leg cramps    Antimicrobials this admission: Cefepime  2/6 >> Vancomycin  2/6 >>  Dose adjustments this admission: 2/7 Vanc 1500 mg q24h >> 1000 mg q12h  Microbiology results: 2/7 MRSA PCR: ordered  Thank you for allowing pharmacy to be a part of this patient's care.  Lolita Rise, PharmD, BCPS Clinical Pharmacist 06/16/2023 9:30 AM

## 2023-06-16 NOTE — Progress Notes (Signed)
 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  Patient on immunotherapy Keytruda .  He has a history of HLD, HTN, resolved as he has had significant weight loss.  He has had progressive dysphagia, difficulty swallowing solids.  He follows with the ENT Dr. Donalee Fruits. He presented with inability to swallow solids or liquids. Admitted for the same.     Assessment & Plan: Assessment/Plan Present on Admission:   Dysphagia w/ esophageal deviation: - Main reason for admission.  Still with difficulty eating or drinking anything.   - Spoke with Dr. Virgia Griffins on-call ENT, who had spoken with Dr. Donalee Fruits, patient's ENT - Difficult surgery.  Plan will be for outpt surgery per ENT - recommended PEG/feeding tube for strengthening/nutrition beforehand.   - spoke with patient/daughter/sister.  All are in agreement with proceeding with feeding tube.  Consult surgery in AM for same.  - will allow liquids tonight (patient strongly desires coca cola) and npo at midnight  Pneumonia //  Immunocompromised patient - no fevers/chills/cough.  Unclear whether actually PNA or not, CT concerning for the same. Regardless, treating as such.  -Pneumonia order set initiated -Blood cultures x 2 ordered -IV cefepime  and vancomycin  on board    AKI (acute kidney injury) (HCC) //  Dehydration -IV fluid hydration - resolved     Excessive secretion -Robinul  ordered    Head and neck cancer w/ mets  Tracheal deviation d/t mass -Per oncologist -Tracheal deviation known  Normocytic anemia: - longstanding, likely chronic disease - with notable drop in Hgb to 7.2 today.  Baseline 9-10.  - repeat tomorrow, transfuse if <7   DVT prophylaxis:  SCDs Start: 06/16/23 0122  Code Status:   Code Status: Full  Code Family Communication: daughter,sister at bedside  Level of care: Med-Surg Status is: Inpatient Remains inpatient appropriate because: inability to tolerate PO, possible PEG placement.    Consults called: ENT, surgery tomorrow for consideration of PEG tube    Subjective: Patient awake and alert.  NO complaints, just thirsty.  IVF going.  We discussed options after having spoke with the ENT.  He desires having feeding tube placed.  Family in room and in agreement.  Breathing well.  He is able to control his saliva/no drooling.  He would like to try full liquids this evening.  Objective: Vitals:   06/16/23 0514 06/16/23 0810 06/16/23 0920 06/16/23 1354  BP: 123/63  (!) 110/57 126/83  Pulse: 98  90 (!) 102  Resp:   15 15  Temp: 97.9 F (36.6 C)  98.1 F (36.7 C) 98.2 F (36.8 C)  TempSrc: Oral   Oral  SpO2: 100%  97% 100%  Weight:  108.4 kg    Height:  6' (1.829 m)      Intake/Output Summary (Last 24 hours) at 06/16/2023 1704 Last data filed at 06/16/2023 1113 Gross per 24 hour  Intake 929.29 ml  Output --  Net 929.29 ml   Filed Weights   06/16/23 0810  Weight: 108.4 kg   Body mass index is 32.41 kg/m.  Gen: 68 y.o. male in no apparent distress.  Nontoxic Neck: Fullness noted particularly on the right side.  No stridor.  No drooling. Pulm: Non-labored breathing.  Clear to auscultation bilaterally.  CV: Regular rate and rhythm. No murmur, rub, or gallop. No JVD GI:  Abdomen soft, non-tender, non-distended, with normoactive bowel sounds. No organomegaly or masses felt. Ext: Warm, no deformities, no pedal edema Skin: No rashes, lesions no ulcers Neuro: Alert and oriented. No focal neurological deficits. Psych: Calm  Judgement and insight appear normal. Mood & affect appropriate.     I have personally reviewed the following labs and images: CBC: Recent Labs  Lab 06/13/23 1452 06/15/23 1916 06/16/23 0422  WBC 8.1 9.5 7.4  NEUTROABS 5.9  --  5.0  HGB 8.1* 8.2* 7.2*   HCT 26.8* 27.8* 23.8*  MCV 83.5 83.7 85.6  PLT 524* 540* 469*   BMP &GFR Recent Labs  Lab 06/13/23 1452 06/15/23 1916 06/16/23 0422  NA 136 136 135  K 4.0 3.9 3.8  CL 98 98 101  CO2 32 27 24  GLUCOSE 149* 136* 119*  BUN 18 18 16   CREATININE 1.20 1.26* 0.98  CALCIUM  9.6 9.5 8.6*   Estimated Creatinine Clearance: 93 mL/min (by C-G formula based on SCr of 0.98 mg/dL). Liver & Pancreas: Recent Labs  Lab 06/13/23 1452 06/15/23 1916  AST 10* 12*  ALT 8 12  ALKPHOS 82 76  BILITOT 0.4 0.7  PROT 8.5* 8.7*  ALBUMIN 3.8 3.5   No results for input(s): "LIPASE", "AMYLASE" in the last 168 hours. No results for input(s): "AMMONIA" in the last 168 hours. Diabetic: No results for input(s): "HGBA1C" in the last 72 hours. No results for input(s): "GLUCAP" in the last 168 hours. Cardiac Enzymes: No results for input(s): "CKTOTAL", "CKMB", "CKMBINDEX", "TROPONINI" in the last 168 hours. No results for input(s): "PROBNP" in the last 8760 hours. Coagulation Profile: No results for input(s): "INR", "PROTIME" in the last 168 hours. Thyroid  Function Tests: Recent Labs    06/16/23 0052  TSH 4.054   Lipid Profile: No results for input(s): "CHOL", "HDL", "LDLCALC", "TRIG", "CHOLHDL", "LDLDIRECT" in the last 72 hours. Anemia Panel: No results for input(s): "VITAMINB12", "FOLATE", "FERRITIN", "TIBC", "IRON", "RETICCTPCT" in the last 72 hours. Urine analysis:    Component Value Date/Time   COLORURINE YELLOW 08/19/2022 1248   APPEARANCEUR CLEAR 08/19/2022 1248   LABSPEC 1.021 08/19/2022 1248   PHURINE 5.0 08/19/2022 1248   GLUCOSEU 50 (A) 08/19/2022 1248   HGBUR NEGATIVE 08/19/2022 1248   BILIRUBINUR NEGATIVE 08/19/2022 1248   BILIRUBINUR negative 12/22/2017 1545   KETONESUR NEGATIVE 08/19/2022 1248   PROTEINUR 30 (A) 08/19/2022 1248   UROBILINOGEN 1.0 12/22/2017 1545   NITRITE NEGATIVE 08/19/2022 1248   LEUKOCYTESUR NEGATIVE 08/19/2022 1248   Sepsis Labs: Invalid input(s):  "PROCALCITONIN", "LACTICIDVEN"  Microbiology: Recent Results (from the past 240 hours)  Resp panel by RT-PCR (RSV, Flu A&B, Covid) Anterior Nasal Swab     Status: None   Collection Time: 06/15/23  7:16 PM   Specimen: Anterior Nasal Swab  Result Value Ref Range Status   SARS Coronavirus 2 by RT PCR NEGATIVE NEGATIVE Final    Comment: (NOTE) SARS-CoV-2 target nucleic acids are NOT DETECTED.  The SARS-CoV-2 RNA is generally detectable in upper respiratory specimens during the acute phase of infection. The lowest concentration of SARS-CoV-2 viral copies this assay can detect is 138 copies/mL. A negative result does not preclude SARS-Cov-2 infection and should not be used as the sole basis for treatment or other patient management decisions. A negative result may occur with  improper specimen collection/handling, submission of specimen other than nasopharyngeal swab, presence of viral mutation(s) within the areas targeted by this assay, and inadequate number of viral copies(<138 copies/mL). A negative result must  be combined with clinical observations, patient history, and epidemiological information. The expected result is Negative.  Fact Sheet for Patients:  BloggerCourse.com  Fact Sheet for Healthcare Providers:  SeriousBroker.it  This test is no t yet approved or cleared by the [place]  FDA and  has been authorized for detection and/or diagnosis of SARS-CoV-2 by FDA under an Emergency Use Authorization (EUA). This EUA will remain  in effect (meaning this test can be used) for the duration of the COVID-19 declaration under Section 564(b)(1) of the Act, 21 U.S.C.section 360bbb-3(b)(1), unless the authorization is terminated  or revoked sooner.       Influenza A by PCR NEGATIVE NEGATIVE Final   Influenza B by PCR NEGATIVE NEGATIVE Final    Comment: (NOTE) The Xpert Xpress SARS-CoV-2/FLU/RSV plus assay is intended as an  aid in the diagnosis of influenza from Nasopharyngeal swab specimens and should not be used as a sole basis for treatment. Nasal washings and aspirates are unacceptable for Xpert Xpress SARS-CoV-2/FLU/RSV testing.  Fact Sheet for Patients: BloggerCourse.com  Fact Sheet for Healthcare Providers: SeriousBroker.it  This test is not yet approved or cleared by the [place]  FDA and has been authorized for detection and/or diagnosis of SARS-CoV-2 by FDA under an Emergency Use Authorization (EUA). This EUA will remain in effect (meaning this test can be used) for the duration of the COVID-19 declaration under Section 564(b)(1) of the Act, 21 U.S.C. section 360bbb-3(b)(1), unless the authorization is terminated or revoked.     Resp Syncytial Virus by PCR NEGATIVE NEGATIVE Final    Comment: (NOTE) Fact Sheet for Patients: BloggerCourse.com  Fact Sheet for Healthcare Providers: SeriousBroker.it  This test is not yet approved or cleared by the [place]  FDA and has been authorized for detection and/or diagnosis of SARS-CoV-2 by FDA under an Emergency Use Authorization (EUA). This EUA will remain in effect (meaning this test can be used) for the duration of the COVID-19 declaration under Section 564(b)(1) of the Act, 21 U.S.C. section 360bbb-3(b)(1), unless the authorization is terminated or revoked.  Performed at [facility], [address], [place], [place] [address]   MRSA Next Gen by PCR, Nasal     Status: None   Collection Time: 06/16/23 10:03 AM   Specimen: Nasal Mucosa; Nasal Swab  Result Value Ref Range Status   MRSA by PCR Next Gen NOT DETECTED NOT DETECTED Final    Comment: (NOTE) The GeneXpert MRSA Assay (FDA approved for NASAL specimens only), is one component of a comprehensive MRSA colonization surveillance program. It is not intended to  diagnose MRSA infection nor to guide or monitor treatment for MRSA infections. Test performance is not FDA approved in patients less than 19 years old. Performed at [facility], [address] [place], [place] [address]     Radiology Studies: CT Soft Tissue Neck W Contrast Result Date: 06/15/2023 CLINICAL DATA:  Shortness of breath with hemoptysis EXAM: CT NECK WITH CONTRAST TECHNIQUE: Multidetector CT imaging of the neck was performed using the standard protocol following the bolus administration of intravenous contrast. RADIATION DOSE REDUCTION: This exam was performed according to the departmental dose-optimization program which includes automated exposure control, adjustment of the mA and/or kV according to patient size and/or use of iterative reconstruction technique. CONTRAST:  75mL OMNIPAQUE  IOHEXOL  350 MG/ML SOLN COMPARISON:  05/05/2022 CT neck 04/14/2023 PET CT FINDINGS: Pharynx and larynx: At the level of the lower glottis and inferior aspect of the thyroid  gland, there is circumferential soft tissue  abnormality surrounding the trachea and posteriorly displacing the esophagus. This masslike region measures approximately 4.0 x 4.2 x 5.2 cm. The mass appears more infiltrative of the surrounding tissues with somewhat indistinct margins suggestive of inflammation. Salivary glands: No inflammation, mass, or stone. Thyroid : Above described soft tissue abnormality at the level of the thoracic inlet is continuous with the lower left thyroid  lobe. This causes marked rightward deviation of the trachea, which remains patent. Lymph nodes: Previously visualized enlarged lymph nodes of the right cervical chain have resolved. Unchanged left level 3 node measuring 6 mm (2:67). No enlarged or abnormal density nodes. Vascular: Right IJ approach central venous catheter with tip below the field of view (Port-A-Cath), with access needle. Limited intracranial: Normal Visualized orbits: Normal  Mastoids and visualized paranasal sinuses: Clear. Skeleton: No acute or aggressive process. Upper chest: Negative. Other: None. IMPRESSION: 1. Circumferential soft tissue abnormality surrounding the trachea and posteriorly displacing the esophagus at the level of the thoracic inlet, worsened since 04/14/2023 and consistent with progression of glottic squamous cell carcinoma. 2. Area inflammatory change associated with the above mass is likely a post radiation effect. 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 3. Previously visualized enlarged lymph nodes of the right cervical chain have resolved. Electronically Signed   By: Juanetta Nordmann M.D.   On: 06/15/2023 23:01   CT Angio Chest PE W and/or Wo Contrast Result Date: 06/15/2023 CLINICAL DATA:  Pulmonary embolus suspected with high probability. Shortness of breath, tachycardia, productive cough with hemoptysis. Cancer. Difficulty swallowing. Known throat cancer. EXAM: CT ANGIOGRAPHY CHEST WITH CONTRAST TECHNIQUE: Multidetector CT imaging of the chest was performed using the standard protocol during bolus administration of intravenous contrast. Multiplanar CT image reconstructions and MIPs were obtained to evaluate the vascular anatomy. RADIATION DOSE REDUCTION: This exam was performed according to the departmental dose-optimization program which includes automated exposure control, adjustment of the mA and/or kV according to patient size and/or use of iterative reconstruction technique. CONTRAST:  75mL OMNIPAQUE  IOHEXOL  350 MG/ML SOLN COMPARISON:  Chest radiograph 06/15/2023. PET-CT 04/14/2023. CT chest 05/05/2022 FINDINGS: Cardiovascular: Technically adequate study with good opacification of the central and segmental pulmonary arteries. Mild motion artifact. No focal filling defects. No evidence of significant pulmonary embolus. Normal heart size. No pericardial effusions. Normal caliber thoracic aorta. No aortic dissection. Great  vessel origins are patent. Mediastinum/Nodes: Right central venous catheter with tip at the cavoatrial junction. Eccentric esophageal or paraesophageal mass at the thoracic inlet measuring about 3.6 cm diameter. This displaces the trachea towards the right. This is consistent with known esophageal neoplasm. The lateral wall of the trachea is thickened and irregular, likely indicating direct invasion of the trachea. The distal esophagus is decompressed. Mildly enlarged mediastinal lymph nodes with pretracheal nodes measuring up to 10 mm short axis dimension. These are nonspecific. Lungs/Pleura: Patchy perihilar infiltrates could be due to motion artifact or may indicate mild edema or pneumonia. Bandlike opacities in the left lung base likely representing scarring. No pleural effusion or pneumothorax. Upper Abdomen: No acute abnormalities. Renal cysts, largest on the left measuring 5.7 cm diameter. No imaging follow-up is indicated. Musculoskeletal: Degenerative changes in the spine. No destructive bone lesions. Review of the MIP images confirms the above findings. IMPRESSION: 1. Esophageal mass of the level of the thoracic inlet, displacing the trachea. This likely corresponds to known cancer. The adjacent tracheal wall is irregular, likely representing direct tumor invasion. Nonspecific mediastinal lymph nodes could be metastatic. 2. No evidence of significant pulmonary embolus.  3. Patchy perihilar infiltrates may represent motion artifact, edema, or pneumonia. Electronically Signed   By: Boyce Byes M.D.   On: 06/15/2023 21:43   DG Chest 2 View Result Date: 06/15/2023 CLINICAL DATA:  Neck pain difficulty swallowing EXAM: CHEST - 2 VIEW COMPARISON:  08/16/2022 FINDINGS: Right-sided central venous port tip at the SVC. No acute airspace disease or effusion. Normal cardiac size. Rightward tracheal deviation with left paratracheal opacity. IMPRESSION: 1. No active cardiopulmonary disease. 2. Rightward tracheal  deviation with left paratracheal opacity,, presumably corresponding to mass seen on prior PET CT. Electronically Signed   By: Esmeralda Hedge M.D.   On: 06/15/2023 20:12   DG Neck Soft Tissue Result Date: 06/15/2023 CLINICAL DATA:  Neck pain difficulty swallowing EXAM: NECK SOFT TISSUES - 1+ VIEW COMPARISON:  Esophagram 06/09/2023, swallow studies 05/25/2023, 04/14/2023 FINDINGS: Normal prevertebral soft tissue thickness. No retropharyngeal thickening or gas. Slightly thickened appearance of the epiglottis and slightly hazy appearance of the glottic area. There may be slight narrowing of the subglottic trachea. Suspect similar appearance on recent swallowing exams. IMPRESSION: Slightly thickened appearance of the epiglottis and slightly hazy appearance of the glottic area suggesting mild edema or inflammation. Consider correlation with direct visualization. Possible mild narrowing of the subglottic trachea as well. Electronically Signed   By: Esmeralda Hedge M.D.   On: 06/15/2023 20:10    Scheduled Meds:  Chlorhexidine  Gluconate Cloth  6 each Topical Daily   sodium chloride  flush  10-40 mL Intracatheter Q12H   Continuous Infusions:  0.9 % NaCl with KCl 20 mEq / L 75 mL/hr at 06/16/23 0231   ceFEPime  (MAXIPIME ) IV 2 g (06/16/23 1634)   vancomycin  1,000 mg (06/16/23 1016)     LOS: 0 days   35 minutes with more than 50% spent in reviewing records, counseling patient/family and coordinating care.  Trenton Frock, MD [facility] [email or web address] 06/16/2023, 5:04 PM

## 2023-06-16 NOTE — ED Notes (Signed)
 ..ED TO INPATIENT HANDOFF REPORT  Name/Age/Gender Jesus [name] 68 y.o. male  Code Status    Code Status Orders  (From admission, onward)           Start     Ordered   06/16/23 0122  Full code  Continuous       Question:  By:  Answer:  Consent: discussion documented in EHR   06/16/23 0122           Code Status History     Date Active Date Inactive Code Status Order ID Comments User Context   06/16/2023 0119 06/16/2023 0122 Full Code [id number]  Corrinne Din, MD ED   06/16/2023 0119 06/16/2023 0119 Limited: Do not attempt resuscitation (DNR) -DNR-LIMITED -Do Not Intubate/DNI  [id number]  Corrinne Din, MD ED   08/10/2022 1620 08/11/2022 0514 Full Code [id number]  Marland Silvas, MD HOV   05/28/2018 1307 06/01/2018 1637 Full Code [id number]  Frankie Israel, MD Inpatient   05/10/2018 1426 05/14/2018 1559 Full Code [id number]  Frankie Israel, MD Inpatient   04/17/2018 0040 04/20/2018 1907 Full Code [id number]  Fidencio Hue, MD Inpatient   04/16/2018 1531 04/17/2018 0039 Full Code [id number]  Frankie Israel, MD Inpatient   03/27/2018 1252 03/31/2018 1820 Full Code [id number]  Frankie Israel, MD Inpatient   02/26/2018 1030 03/02/2018 1650 Full Code [id number]  Frankie Israel, MD Inpatient   02/05/2018 1100 02/09/2018 1607 Full Code [id number]  Frankie Israel, MD Inpatient   01/15/2018 1126 01/19/2018 2134 Full Code [id number]  Frankie Israel, MD Inpatient       Home/SNF/Other Home  Chief Complaint Dysphagia [R13.10]  Level of Care/Admitting Diagnosis ED Disposition     ED Disposition  Admit   Condition  --   Comment  Hospital Area: [facility] [[id number]]  Level of Care: Med-Surg [16]  May admit patient to [facility] or [facility] if equivalent level of care is available:: Yes  Covid Evaluation: Asymptomatic - no recent exposure (last 10 days) testing not required  Diagnosis: Dysphagia [706237]  Admitting Physician: Corrinne Din  [[id number]]  Attending Physician: Corrinne Din [[id number]]  Certification:: I certify this patient will need inpatient services for at least 2 midnights  Expected Medical Readiness: 06/18/2023          Medical History 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- last radiation 06-2018   Cataract    removed both eyes with l;ens implants    COPD (chronic obstructive pulmonary disease) (HCC)    Family history of colon cancer    in his brother- dx'd age 67    History of chemotherapy    last 06-01-2018   History of kidney stones    History of radiation therapy    last radiation 06-2018   Hyperlipidemia    currently under control   Hypertension    Irregular heart beats    Lymphadenopathy    Pain, lower leg    Bilateral   Peripheral arterial disease (HCC)    Pre-diabetes    Red-green color blindness    RLS (restless legs syndrome)    Snores    Wears glasses     Allergies Allergies  Allergen Reactions   Bee  Venom Anaphylaxis   Lisinopril  Other (See Comments)    Swelling in face   Antifungal [Miconazole Nitrate] Other (See Comments)     made his head hurt   Zolpidem Tartrate Er Other (See Comments)    Leg cramps    IV Location/Drains/Wounds Patient Lines/Drains/Airways Status     Active Line/Drains/Airways     Name Placement date Placement time Site Days   Implanted Port 08/10/22 Right Chest 08/10/22  1547  Chest  310            Labs/Imaging Results for orders placed or performed during the hospital encounter of 06/15/23 (from the past 48 hours)  Resp panel by RT-PCR (RSV, Flu A&B, Covid) Anterior Nasal Swab     Status: None   Collection Time: 06/15/23  7:16 PM   Specimen: Anterior Nasal Swab  Result Value Ref Range   SARS Coronavirus 2 by RT PCR NEGATIVE NEGATIVE     Comment: (NOTE) SARS-CoV-2 target nucleic acids are NOT DETECTED.  The SARS-CoV-2 RNA is generally detectable in upper respiratory specimens during the acute phase of infection. The lowest concentration of SARS-CoV-2 viral copies this assay can detect is 138 copies/mL. A negative result does not preclude SARS-Cov-2 infection and should not be used as the sole basis for treatment or other patient management decisions. A negative result may occur with  improper specimen collection/handling, submission of specimen other than nasopharyngeal swab, presence of viral mutation(s) within the areas targeted by this assay, and inadequate number of viral copies(<138 copies/mL). A negative result must be combined with clinical observations, patient history, and epidemiological information. The expected result is Negative.  Fact Sheet for Patients:  BloggerCourse.com  Fact Sheet for Healthcare Providers:  SeriousBroker.it  This test is no t yet approved or cleared by the [place]  FDA and  has been authorized for detection and/or diagnosis of SARS-CoV-2 by FDA under an Emergency Use Authorization (EUA). This EUA will remain  in effect (meaning this test can be used) for the duration of the COVID-19 declaration under Section 564(b)(1) of the Act, 21 U.S.C.section 360bbb-3(b)(1), unless the authorization is terminated  or revoked sooner.       Influenza A by PCR NEGATIVE NEGATIVE   Influenza B by PCR NEGATIVE NEGATIVE    Comment: (NOTE) The Xpert Xpress SARS-CoV-2/FLU/RSV plus assay is intended as an aid in the diagnosis of influenza from Nasopharyngeal swab specimens and should not be used as a sole basis for treatment. Nasal washings and aspirates are unacceptable for Xpert Xpress SARS-CoV-2/FLU/RSV testing.  Fact Sheet for Patients: BloggerCourse.com  Fact Sheet for Healthcare  Providers: SeriousBroker.it  This test is not yet approved or cleared by the [place]  FDA and has been authorized for detection and/or diagnosis of SARS-CoV-2 by FDA under an Emergency Use Authorization (EUA). This EUA will remain in effect (meaning this test can be used) for the duration of the COVID-19 declaration under Section 564(b)(1) of the Act, 21 U.S.C. section 360bbb-3(b)(1), unless the authorization is terminated or revoked.     Resp Syncytial Virus by PCR NEGATIVE NEGATIVE    Comment: (NOTE) Fact Sheet for Patients: BloggerCourse.com  Fact Sheet for Healthcare Providers: SeriousBroker.it  This test is not yet approved or cleared by the [place]  FDA and has been authorized for detection and/or diagnosis of SARS-CoV-2 by FDA under an Emergency Use Authorization (EUA). This EUA will remain in effect (meaning this test can be used) for the duration of the COVID-19 declaration under Section 564(b)(1) of the  Act, 21 U.S.C. section 360bbb-3(b)(1), unless the authorization is terminated or revoked.  Performed at [facility], [address], [place], [place] [address]   CBC     Status: Abnormal   Collection Time: 06/15/23  7:16 PM  Result Value Ref Range   WBC 9.5 4.0 - 10.5 K/uL   RBC 3.32 (L) 4.22 - 5.81 MIL/uL   Hemoglobin 8.2 (L) 13.0 - 17.0 g/dL   HCT 60.4 (L) 54.0 - 98.1 %   MCV 83.7 80.0 - 100.0 fL   MCH 24.7 (L) 26.0 - 34.0 pg   MCHC 29.5 (L) 30.0 - 36.0 g/dL   RDW 19.1 47.8 - 29.5 %   Platelets 540 (H) 150 - 400 K/uL   nRBC 0.0 0.0 - 0.2 %    Comment: Performed at [facility], [address] [place], [place] [address]  Comprehensive metabolic panel     Status: Abnormal   Collection Time: 06/15/23  7:16 PM  Result Value Ref Range   Sodium 136 135 - 145 mmol/L   Potassium 3.9 3.5 - 5.1 mmol/L   Chloride 98 98 - 111 mmol/L   CO2 27 22  - 32 mmol/L   Glucose, Bld 136 (H) 70 - 99 mg/dL    Comment: Glucose reference range applies only to samples taken after fasting for at least 8 hours.   BUN 18 8 - 23 mg/dL   Creatinine, Ser 8.65 (H) 0.61 - 1.24 mg/dL   Calcium  9.5 8.9 - 10.3 mg/dL   Total Protein 8.7 (H) 6.5 - 8.1 g/dL   Albumin 3.5 3.5 - 5.0 g/dL   AST 12 (L) 15 - 41 U/L   ALT 12 0 - 44 U/L   Alkaline Phosphatase 76 38 - 126 U/L   Total Bilirubin 0.7 0.0 - 1.2 mg/dL   GFR, Estimated >78 >46 mL/min    Comment: (NOTE) Calculated using the CKD-EPI Creatinine Equation (2021)    Anion gap 11 5 - 15    Comment: Performed at [facility], [address] [place], [place] [address]  Troponin I (High Sensitivity)     Status: None   Collection Time: 06/15/23  7:16 PM  Result Value Ref Range   Troponin I (High Sensitivity) 9 <18 ng/L    Comment: (NOTE) Elevated high sensitivity troponin I (hsTnI) values and significant  changes across serial measurements may suggest ACS but many other  chronic and acute conditions are known to elevate hsTnI results.  Refer to the "Links" section for chest pain algorithms and additional  guidance. Performed at [facility], [address], [place], [place] [address]   Lactic acid, plasma     Status: None   Collection Time: 06/15/23  8:34 PM  Result Value Ref Range   Lactic Acid, Venous 0.7 0.5 - 1.9 mmol/L    Comment: Performed at [facility], [address], [place], [place] [address]  TSH     Status: None   Collection Time: 06/16/23 12:52 AM  Result Value Ref Range   TSH 4.054 0.350 - 4.500 uIU/mL    Comment: Performed by a 3rd Generation assay with a functional sensitivity of <=0.01 uIU/mL. Performed at [facility], [address] [place], [place] [address]    CT Soft Tissue Neck W Contrast Result Date: 06/15/2023 CLINICAL DATA:  Shortness of breath with hemoptysis EXAM: CT NECK WITH CONTRAST  TECHNIQUE: Multidetector CT imaging of the neck was performed  using the standard protocol following the bolus administration of intravenous contrast. RADIATION DOSE REDUCTION: This exam was performed according to the departmental dose-optimization program which includes automated exposure control, adjustment of the mA and/or kV according to patient size and/or use of iterative reconstruction technique. CONTRAST:  75mL OMNIPAQUE  IOHEXOL  350 MG/ML SOLN COMPARISON:  05/05/2022 CT neck 04/14/2023 PET CT FINDINGS: Pharynx and larynx: At the level of the lower glottis and inferior aspect of the thyroid  gland, there is circumferential soft tissue abnormality surrounding the trachea and posteriorly displacing the esophagus. This masslike region measures approximately 4.0 x 4.2 x 5.2 cm. The mass appears more infiltrative of the surrounding tissues with somewhat indistinct margins suggestive of inflammation. Salivary glands: No inflammation, mass, or stone. Thyroid : Above described soft tissue abnormality at the level of the thoracic inlet is continuous with the lower left thyroid  lobe. This causes marked rightward deviation of the trachea, which remains patent. Lymph nodes: Previously visualized enlarged lymph nodes of the right cervical chain have resolved. Unchanged left level 3 node measuring 6 mm (2:67). No enlarged or abnormal density nodes. Vascular: Right IJ approach central venous catheter with tip below the field of view (Port-A-Cath), with access needle. Limited intracranial: Normal Visualized orbits: Normal Mastoids and visualized paranasal sinuses: Clear. Skeleton: No acute or aggressive process. Upper chest: Negative. Other: None. IMPRESSION: 1. Circumferential soft tissue abnormality surrounding the trachea and posteriorly displacing the esophagus at the level of the thoracic inlet, worsened since 04/14/2023 and consistent with progression of glottic squamous cell carcinoma. 2. Area inflammatory change  associated with the above mass is likely a post radiation effect. 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 3. Previously visualized enlarged lymph nodes of the right cervical chain have resolved. Electronically Signed   By: Juanetta Nordmann M.D.   On: 06/15/2023 23:01   CT Angio Chest PE W and/or Wo Contrast Result Date: 06/15/2023 CLINICAL DATA:  Pulmonary embolus suspected with high probability. Shortness of breath, tachycardia, productive cough with hemoptysis. Cancer. Difficulty swallowing. Known throat cancer. EXAM: CT ANGIOGRAPHY CHEST WITH CONTRAST TECHNIQUE: Multidetector CT imaging of the chest was performed using the standard protocol during bolus administration of intravenous contrast. Multiplanar CT image reconstructions and MIPs were obtained to evaluate the vascular anatomy. RADIATION DOSE REDUCTION: This exam was performed according to the departmental dose-optimization program which includes automated exposure control, adjustment of the mA and/or kV according to patient size and/or use of iterative reconstruction technique. CONTRAST:  75mL OMNIPAQUE  IOHEXOL  350 MG/ML SOLN COMPARISON:  Chest radiograph 06/15/2023. PET-CT 04/14/2023. CT chest 05/05/2022 FINDINGS: Cardiovascular: Technically adequate study with good opacification of the central and segmental pulmonary arteries. Mild motion artifact. No focal filling defects. No evidence of significant pulmonary embolus. Normal heart size. No pericardial effusions. Normal caliber thoracic aorta. No aortic dissection. Great vessel origins are patent. Mediastinum/Nodes: Right central venous catheter with tip at the cavoatrial junction. Eccentric esophageal or paraesophageal mass at the thoracic inlet measuring about 3.6 cm diameter. This displaces the trachea towards the right. This is consistent with known esophageal neoplasm. The lateral wall of the trachea is thickened and irregular, likely indicating direct invasion of  the trachea. The distal esophagus is decompressed. Mildly enlarged mediastinal lymph nodes with pretracheal nodes measuring up to 10 mm short axis dimension. These are nonspecific. Lungs/Pleura: Patchy perihilar infiltrates could be due to motion artifact or may indicate mild edema or pneumonia. Bandlike opacities in the left lung base likely representing scarring. No pleural effusion or pneumothorax.  Upper Abdomen: No acute abnormalities. Renal cysts, largest on the left measuring 5.7 cm diameter. No imaging follow-up is indicated. Musculoskeletal: Degenerative changes in the spine. No destructive bone lesions. Review of the MIP images confirms the above findings. IMPRESSION: 1. Esophageal mass of the level of the thoracic inlet, displacing the trachea. This likely corresponds to known cancer. The adjacent tracheal wall is irregular, likely representing direct tumor invasion. Nonspecific mediastinal lymph nodes could be metastatic. 2. No evidence of significant pulmonary embolus. 3. Patchy perihilar infiltrates may represent motion artifact, edema, or pneumonia. Electronically Signed   By: Boyce Byes M.D.   On: 06/15/2023 21:43   DG Chest 2 View Result Date: 06/15/2023 CLINICAL DATA:  Neck pain difficulty swallowing EXAM: CHEST - 2 VIEW COMPARISON:  08/16/2022 FINDINGS: Right-sided central venous port tip at the SVC. No acute airspace disease or effusion. Normal cardiac size. Rightward tracheal deviation with left paratracheal opacity. IMPRESSION: 1. No active cardiopulmonary disease. 2. Rightward tracheal deviation with left paratracheal opacity,, presumably corresponding to mass seen on prior PET CT. Electronically Signed   By: Esmeralda Hedge M.D.   On: 06/15/2023 20:12   DG Neck Soft Tissue Result Date: 06/15/2023 CLINICAL DATA:  Neck pain difficulty swallowing EXAM: NECK SOFT TISSUES - 1+ VIEW COMPARISON:  Esophagram 06/09/2023, swallow studies 05/25/2023, 04/14/2023 FINDINGS: Normal prevertebral soft  tissue thickness. No retropharyngeal thickening or gas. Slightly thickened appearance of the epiglottis and slightly hazy appearance of the glottic area. There may be slight narrowing of the subglottic trachea. Suspect similar appearance on recent swallowing exams. IMPRESSION: Slightly thickened appearance of the epiglottis and slightly hazy appearance of the glottic area suggesting mild edema or inflammation. Consider correlation with direct visualization. Possible mild narrowing of the subglottic trachea as well. Electronically Signed   By: Esmeralda Hedge M.D.   On: 06/15/2023 20:10    Pending Labs Unresulted Labs (From admission, onward)     Start     Ordered   06/16/23 0500  Basic metabolic panel  Tomorrow morning,   R        06/16/23 0122   06/16/23 0500  CBC with Differential/Platelet  Tomorrow morning,   R        06/16/23 0122            Vitals/Pain Today's Vitals   06/16/23 0145 06/16/23 0200 06/16/23 0215 06/16/23 0231  BP: 123/62 109/61 93/60   Pulse: 94 92 97   Resp: (!) 24 (!) 21 (!) 21   Temp:    98.2 F (36.8 C)  TempSrc:    Oral  SpO2: 100% 99% 99%   PainSc:        Isolation Precautions No active isolations  Medications Medications  ceFEPIme  (MAXIPIME ) 2 g in sodium chloride  0.9 % 100 mL IVPB (0 g Intravenous Stopped 06/16/23 0027)  glycopyrrolate  (ROBINUL ) tablet 1 mg (has no administration in time range)    Or  glycopyrrolate  (ROBINUL ) injection 0.2 mg (has no administration in time range)    Or  glycopyrrolate  (ROBINUL ) injection 0.2 mg (has no administration in time range)  0.9 % NaCl with KCl 20 mEq/ L  infusion ( Intravenous New Bag/Given 06/16/23 0231)  acetaminophen  (TYLENOL ) tablet 650 mg (has no administration in time range)    Or  acetaminophen  (TYLENOL ) suppository 650 mg (has no administration in time range)  ondansetron  (ZOFRAN ) tablet 4 mg (has no administration in time range)    Or  ondansetron  (ZOFRAN ) injection 4 mg (has no administration in  time range)  HYDROmorphone  (DILAUDID ) injection 0.5 mg (has no administration in time range)  vancomycin  (VANCOREADY) IVPB 1500 mg/300 mL (has no administration in time range)  sodium chloride  0.9 % bolus 1,000 mL (0 mLs Intravenous Stopped 06/15/23 2224)  iohexol  (OMNIPAQUE ) 350 MG/ML injection 75 mL (75 mLs Intravenous Contrast Given 06/15/23 2116)  vancomycin  (VANCOREADY) IVPB 2000 mg/400 mL (0 mg Intravenous Stopped 06/16/23 0227)  sodium chloride  0.9 % bolus 1,000 mL (0 mLs Intravenous Stopped 06/16/23 0226)    Mobility walks

## 2023-06-16 NOTE — H&P (Addendum)
 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  Patient on immunotherapy Keytruda .  He has a history of HLD, HTN, resolved as he has had significant weight loss.  He has had progressive dysphagia, difficulty swallowing solids.  He follows with the ENT Dr. Donalee Fruits. He presents today with inability to swallow solids or liquids.  As result he has not eaten or drank in the last 3 days.  Currently he is unable to control his saliva.  He denies return difficulty. He is scheduled to have his esophagus stretched 3/12?  He was instructed by ENT if his ability to swallow became worse he go to the ER.  Additionally he recently started coughing up blood clots.  He came to the ER.  In the ER vital stable.  Respiratory panel negative, creatinine 1.26. CT soft tissue neck: Circumferential soft tissue surrounding trachea and posterior displacing esophagus, worsened since 04/14/2023 CT chest Esophageal mass at the level of the thoracic inlet, displacing the trachea.  Patchy infrahilar infiltrates may represent pneumonia. Blood cultures x 2 collected.  IV cefepime  and vancomycin  given.  Admission requested  Review of Systems:  Per HPI  Past Medical History: 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- last radiation 06-2018   Cataract    removed both eyes with l;ens implants    COPD (chronic obstructive pulmonary disease) (HCC)    Family history of colon cancer    in his brother- dx'd age 35    History of chemotherapy    last 06-01-2018   History of kidney stones    History of radiation therapy    last radiation 06-2018    Hyperlipidemia    currently under control   Hypertension    Irregular heart beats    Lymphadenopathy    Pain, lower leg    Bilateral   Peripheral arterial disease (HCC)    Pre-diabetes    Red-green color blindness    RLS (restless legs syndrome)    Snores    Wears glasses    Past Surgical History:  Procedure Laterality Date   CATARACT EXTRACTION W/ INTRAOCULAR LENS  IMPLANT, BILATERAL     COLONOSCOPY     DIRECT LARYNGOSCOPY Right 02/03/2022   Procedure: DIRECT LARYNGOSCOPY WITH BIOPSY OF RIGHT FALSE VOCAL CORD;  Surgeon: Ammon Bales, MD;  Location: [facility] OR;  Service: ENT;  Laterality: Right;   dislodged salava stone     FRACTURE SURGERY     HAND ARTHROPLASTY  1995   crushed left hand   INGUINAL LYMPH NODE BIOPSY Left 01/02/2018   Procedure: LEFT INGUINAL LYMPH NODE BIOPSY;  Surgeon: Enid Harry, MD;  Location: [facility] OR;  Service: General;  Laterality: Left;   IR IMAGING GUIDED PORT INSERTION  01/15/2018   IR IMAGING GUIDED PORT INSERTION  08/10/2022   IR REMOVAL TUN ACCESS W/ PORT W/O FL MOD SED  03/11/2019   MEDIASTINOSCOPY N/A 07/22/2022   Procedure: MEDIASTINOSCOPY;  Surgeon: Zelphia Higashi, MD;  Location: [facility] OR;  Service: Thoracic;  Laterality: N/A;   MICROLARYNGOSCOPY Left 01/17/2014  Procedure: MICROLARYNGOSCOPY WITH EXCISION OF THE BIOPSY OF LEFT VOCAL CORD LESION;  Surgeon: Janita Mellow, MD;  Location: [facility];  Service: ENT;  Laterality: Left;   MICROLARYNGOSCOPY N/A 09/16/2020   Procedure: MICROLARYNGOSCOPY with Biopsy of vocal cord lesion;  Surgeon: Ammon Bales, MD;  Location: [facility] OR;  Service: ENT;  Laterality: N/A;   ORIF FOOT FRACTURE  2005   left   REFRACTIVE SURGERY Right    removed cloudiness in right eye after cataract removal     Medications: Prior to Admission medications   Medication Sig Start Date End Date Taking? Authorizing Provider  acetaminophen  (TYLENOL ) 500 MG tablet Take 1,500 mg by mouth daily as needed for moderate  pain.   Yes [provider]  aspirin  EC 81 MG tablet Take 81 mg by mouth every evening. Swallow whole.   Yes [provider]  azithromycin  (ZITHROMAX ) 200 MG/5ML suspension 12.5 milliliters x 1 day, then 6 milliliters daily x 4 days Orally Once a day for 5 days 06/07/23  Yes [provider]  b complex vitamins capsule Take 1 capsule by mouth daily.   Yes [provider]  cholecalciferol (VITAMIN D3) 25 MCG (1000 UNIT) tablet Take 2,000 Units by mouth daily.   Yes [provider]  lidocaine -prilocaine  (EMLA ) cream Apply to affected area once 08/04/22  Yes Kale, Gautam Kishore, MD  Multiple Vitamins-Minerals (MULTI ADULT GUMMIES PO) Take 1 tablet by mouth in the morning. Centrum   Yes [provider]  oxyCODONE  (OXY IR/ROXICODONE ) 5 MG immediate release tablet Take 1 tablet (5 mg total) by mouth every 6 (six) hours as needed for moderate pain or breakthrough pain. 07/22/22  Yes Zelphia Higashi, MD  ACCU-CHEK GUIDE test strip 1 each by Other route as needed for other. 01/05/23   [provider]  Accu-Chek Softclix Lancets lancets  01/05/23   [provider]  amLODipine  (NORVASC ) 10 MG tablet TAKE 1 TABLET BY MOUTH EVERY DAY Patient not taking: Reported on 06/15/2023 02/19/20   Benjiman Bras, MD  AREXVY 120 MCG/0.5ML injection  01/15/23   [provider]  lidocaine  (XYLOCAINE ) 2 % solution Patient: Mix 1part 2% viscous lidocaine , 1part H20. Swallow 10mL of diluted mixture, before meals and at bedtime, up to QID to help sore throat Patient not taking: Reported on 06/15/2023 12/21/22   Colie Dawes, MD  metFORMIN (GLUCOPHAGE) 500 MG tablet Take 500 mg by mouth 2 (two) times daily with a meal. Patient not taking: Reported on 06/15/2023 01/20/22   [provider]  nystatin  (MYCOSTATIN ) 100000 UNIT/ML suspension Take 5 mLs (500,000 Units total) by mouth 4 (four) times daily. Patient not taking: Reported on 06/15/2023  05/19/23   Raspet, Erin K, PA-C  prochlorperazine  (COMPAZINE ) 10 MG tablet Take 1 tablet (10 mg total) by mouth every 6 (six) hours as needed for nausea or vomiting. Patient not taking: Reported on 06/15/2023 08/04/22   Kale, Gautam Kishore, MD  sildenafil (VIAGRA) 100 MG tablet 1 tablet as needed Orally Once a day for 30 days Patient not taking: Reported on 06/15/2023 06/07/23   [provider]    Allergies:   Allergies  Allergen Reactions   Bee Venom Anaphylaxis   Lisinopril  Other (See Comments)    Swelling in face   Antifungal [Miconazole Nitrate] Other (See Comments)     made his head hurt   Zolpidem Tartrate Er Other (See Comments)    Leg cramps    Social History:  reports that he  quit smoking about 2 years ago. His smoking use included cigarettes. He started smoking about 38 years ago. He has a 18 pack-year smoking history. He has never used smokeless tobacco. He reports that he does not currently use alcohol after a past usage of about 6.0 standard drinks of alcohol per week. He reports current drug use. Drugs: , Marijuana, and Cocaine.  Family History: Family History  Problem Relation Age of Onset   Breast cancer Mother    Diabetes Father    Hypertension Father    Stroke Father    Mental illness Sister    Hypertension Daughter    Mental illness Daughter    Hypertension Brother    Colon cancer Brother 85       passed away 12/06/18   Breast cancer Sister    Esophageal cancer Neg Hx    Colon polyps Neg Hx    Rectal cancer Neg Hx    Stomach cancer Neg Hx     Physical Exam: Vitals:   06/15/23 2100 06/15/23 2145 06/15/23 2215 06/15/23 2245  BP: (!) 126/101 134/70 139/70 (!) 134/95  Pulse: (!) 107 (!) 108 (!) 104 (!) 109  Resp: 20 19 (!) 22 13  Temp:   98.6 F (37 C)   TempSrc:   Oral   SpO2: 99% 100% 99% 100%    General:  A&Ox3, well developed and nourished, not in distress Eyes: Pink conjunctiva, no scleral icterus ENT: Neck fullness.  No stridor Lungs:  CTA B/L, no wheeze, no crackles, no use of accessory muscles Cardiovascular: Tachycardia, RRR, no murmurs. No carotid bruits, no JVD Abdomen: soft, positive BS, NTND, no organomegaly, not an acute abdomen GU: not examined Neuro: CN II - XII grossly intact, sensation intact Musculoskeletal: strength 5/5 all extremities, no edema Skin: no rash, no subcutaneous crepitation, no decubitus Psych: appropriate patient   Labs on Admission:  Recent Labs    06/13/23 1452 06/15/23 1916  NA 136 136  K 4.0 3.9  CL 98 98  CO2 32 27  GLUCOSE 149* 136*  BUN 18 18  CREATININE 1.20 1.26*  CALCIUM  9.6 9.5   Recent Labs    06/13/23 1452 06/15/23 1916  AST 10* 12*  ALT 8 12  ALKPHOS 82 76  BILITOT 0.4 0.7  PROT 8.5* 8.7*  ALBUMIN 3.8 3.5    Recent Labs    06/13/23 1452 06/15/23 1916  WBC 8.1 9.5  NEUTROABS 5.9  --   HGB 8.1* 8.2*  HCT 26.8* 27.8*  MCV 83.5 83.7  PLT 524* 540*    Recent Labs    06/13/23 1452 06/13/23 1453  TSH  --  4.511*  T4TOTAL 7.6  --     Micro Results: Recent Results (from the past 240 hours)  Resp panel by RT-PCR (RSV, Flu A&B, Covid) Anterior Nasal Swab     Status: None   Collection Time: 06/15/23  7:16 PM   Specimen: Anterior Nasal Swab  Result Value Ref Range Status   SARS Coronavirus 2 by RT PCR NEGATIVE NEGATIVE Final    Comment: (NOTE) SARS-CoV-2 target nucleic acids are NOT DETECTED.  The SARS-CoV-2 RNA is generally detectable in upper respiratory specimens during the acute phase of infection. The lowest concentration of SARS-CoV-2 viral copies this assay can detect is 138 copies/mL. A negative result does not preclude SARS-Cov-2 infection and should not be used as the sole basis for treatment or other patient management decisions. A negative result may occur with  improper specimen collection/handling, submission  of specimen other than nasopharyngeal swab, presence of viral mutation(s) within the areas targeted by this assay, and  inadequate number of viral copies(<138 copies/mL). A negative result must be combined with clinical observations, patient history, and epidemiological information. The expected result is Negative.  Fact Sheet for Patients:  BloggerCourse.com  Fact Sheet for Healthcare Providers:  SeriousBroker.it  This test is no t yet approved or cleared by the [place]  FDA and  has been authorized for detection and/or diagnosis of SARS-CoV-2 by FDA under an Emergency Use Authorization (EUA). This EUA will remain  in effect (meaning this test can be used) for the duration of the COVID-19 declaration under Section 564(b)(1) of the Act, 21 U.S.C.section 360bbb-3(b)(1), unless the authorization is terminated  or revoked sooner.       Influenza A by PCR NEGATIVE NEGATIVE Final   Influenza B by PCR NEGATIVE NEGATIVE Final    Comment: (NOTE) The Xpert Xpress SARS-CoV-2/FLU/RSV plus assay is intended as an aid in the diagnosis of influenza from Nasopharyngeal swab specimens and should not be used as a sole basis for treatment. Nasal washings and aspirates are unacceptable for Xpert Xpress SARS-CoV-2/FLU/RSV testing.  Fact Sheet for Patients: BloggerCourse.com  Fact Sheet for Healthcare Providers: SeriousBroker.it  This test is not yet approved or cleared by the [place]  FDA and has been authorized for detection and/or diagnosis of SARS-CoV-2 by FDA under an Emergency Use Authorization (EUA). This EUA will remain in effect (meaning this test can be used) for the duration of the COVID-19 declaration under Section 564(b)(1) of the Act, 21 U.S.C. section 360bbb-3(b)(1), unless the authorization is terminated or revoked.     Resp Syncytial Virus by PCR NEGATIVE NEGATIVE Final    Comment: (NOTE) Fact Sheet for Patients: BloggerCourse.com  Fact Sheet for Healthcare  Providers: SeriousBroker.it  This test is not yet approved or cleared by the [place]  FDA and has been authorized for detection and/or diagnosis of SARS-CoV-2 by FDA under an Emergency Use Authorization (EUA). This EUA will remain in effect (meaning this test can be used) for the duration of the COVID-19 declaration under Section 564(b)(1) of the Act, 21 U.S.C. section 360bbb-3(b)(1), unless the authorization is terminated or revoked.  Performed at [facility], [address], [place], [place] [address]      Radiological Exams on Admission: CT Soft Tissue Neck W Contrast Result Date: 06/15/2023 CLINICAL DATA:  Shortness of breath with hemoptysis EXAM: CT NECK WITH CONTRAST TECHNIQUE: Multidetector CT imaging of the neck was performed using the standard protocol following the bolus administration of intravenous contrast. RADIATION DOSE REDUCTION: This exam was performed according to the departmental dose-optimization program which includes automated exposure control, adjustment of the mA and/or kV according to patient size and/or use of iterative reconstruction technique. CONTRAST:  75mL OMNIPAQUE  IOHEXOL  350 MG/ML SOLN COMPARISON:  05/05/2022 CT neck 04/14/2023 PET CT FINDINGS: Pharynx and larynx: At the level of the lower glottis and inferior aspect of the thyroid  gland, there is circumferential soft tissue abnormality surrounding the trachea and posteriorly displacing the esophagus. This masslike region measures approximately 4.0 x 4.2 x 5.2 cm. The mass appears more infiltrative of the surrounding tissues with somewhat indistinct margins suggestive of inflammation. Salivary glands: No inflammation, mass, or stone. Thyroid : Above described soft tissue abnormality at the level of the thoracic inlet is continuous with the lower left thyroid  lobe. This causes marked rightward deviation of the trachea, which remains patent. Lymph nodes: Previously  visualized enlarged lymph nodes  of the right cervical chain have resolved. Unchanged left level 3 node measuring 6 mm (2:67). No enlarged or abnormal density nodes. Vascular: Right IJ approach central venous catheter with tip below the field of view (Port-A-Cath), with access needle. Limited intracranial: Normal Visualized orbits: Normal Mastoids and visualized paranasal sinuses: Clear. Skeleton: No acute or aggressive process. Upper chest: Negative. Other: None. IMPRESSION: 1. Circumferential soft tissue abnormality surrounding the trachea and posteriorly displacing the esophagus at the level of the thoracic inlet, worsened since 04/14/2023 and consistent with progression of glottic squamous cell carcinoma. 2. Area inflammatory change associated with the above mass is likely a post radiation effect. 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 3. Previously visualized enlarged lymph nodes of the right cervical chain have resolved. Electronically Signed   By: Juanetta Nordmann M.D.   On: 06/15/2023 23:01   CT Angio Chest PE W and/or Wo Contrast Result Date: 06/15/2023 CLINICAL DATA:  Pulmonary embolus suspected with high probability. Shortness of breath, tachycardia, productive cough with hemoptysis. Cancer. Difficulty swallowing. Known throat cancer. EXAM: CT ANGIOGRAPHY CHEST WITH CONTRAST TECHNIQUE: Multidetector CT imaging of the chest was performed using the standard protocol during bolus administration of intravenous contrast. Multiplanar CT image reconstructions and MIPs were obtained to evaluate the vascular anatomy. RADIATION DOSE REDUCTION: This exam was performed according to the departmental dose-optimization program which includes automated exposure control, adjustment of the mA and/or kV according to patient size and/or use of iterative reconstruction technique. CONTRAST:  75mL OMNIPAQUE  IOHEXOL  350 MG/ML SOLN COMPARISON:  Chest radiograph 06/15/2023. PET-CT 04/14/2023. CT  chest 05/05/2022 FINDINGS: Cardiovascular: Technically adequate study with good opacification of the central and segmental pulmonary arteries. Mild motion artifact. No focal filling defects. No evidence of significant pulmonary embolus. Normal heart size. No pericardial effusions. Normal caliber thoracic aorta. No aortic dissection. Great vessel origins are patent. Mediastinum/Nodes: Right central venous catheter with tip at the cavoatrial junction. Eccentric esophageal or paraesophageal mass at the thoracic inlet measuring about 3.6 cm diameter. This displaces the trachea towards the right. This is consistent with known esophageal neoplasm. The lateral wall of the trachea is thickened and irregular, likely indicating direct invasion of the trachea. The distal esophagus is decompressed. Mildly enlarged mediastinal lymph nodes with pretracheal nodes measuring up to 10 mm short axis dimension. These are nonspecific. Lungs/Pleura: Patchy perihilar infiltrates could be due to motion artifact or may indicate mild edema or pneumonia. Bandlike opacities in the left lung base likely representing scarring. No pleural effusion or pneumothorax. Upper Abdomen: No acute abnormalities. Renal cysts, largest on the left measuring 5.7 cm diameter. No imaging follow-up is indicated. Musculoskeletal: Degenerative changes in the spine. No destructive bone lesions. Review of the MIP images confirms the above findings. IMPRESSION: 1. Esophageal mass of the level of the thoracic inlet, displacing the trachea. This likely corresponds to known cancer. The adjacent tracheal wall is irregular, likely representing direct tumor invasion. Nonspecific mediastinal lymph nodes could be metastatic. 2. No evidence of significant pulmonary embolus. 3. Patchy perihilar infiltrates may represent motion artifact, edema, or pneumonia. Electronically Signed   By: Boyce Byes M.D.   On: 06/15/2023 21:43   DG Chest 2 View Result Date:  06/15/2023 CLINICAL DATA:  Neck pain difficulty swallowing EXAM: CHEST - 2 VIEW COMPARISON:  08/16/2022 FINDINGS: Right-sided central venous port tip at the SVC. No acute airspace disease or effusion. Normal cardiac size. Rightward tracheal deviation with left paratracheal opacity. IMPRESSION: 1. No active cardiopulmonary disease. 2. Rightward  tracheal deviation with left paratracheal opacity,, presumably corresponding to mass seen on prior PET CT. Electronically Signed   By: Esmeralda Hedge M.D.   On: 06/15/2023 20:12   DG Neck Soft Tissue Result Date: 06/15/2023 CLINICAL DATA:  Neck pain difficulty swallowing EXAM: NECK SOFT TISSUES - 1+ VIEW COMPARISON:  Esophagram 06/09/2023, swallow studies 05/25/2023, 04/14/2023 FINDINGS: Normal prevertebral soft tissue thickness. No retropharyngeal thickening or gas. Slightly thickened appearance of the epiglottis and slightly hazy appearance of the glottic area. There may be slight narrowing of the subglottic trachea. Suspect similar appearance on recent swallowing exams. IMPRESSION: Slightly thickened appearance of the epiglottis and slightly hazy appearance of the glottic area suggesting mild edema or inflammation. Consider correlation with direct visualization. Possible mild narrowing of the subglottic trachea as well. Electronically Signed   By: Esmeralda Hedge M.D.   On: 06/15/2023 20:10    Assessment/Plan Present on Admission:  Pneumonia //  Immunocompromised patient //   Hemoptysis -Pneumonia order set initiated -Blood cultures x 2 ordered -IV cefepime  and vancomycin  ordered   Dysphagia w/ esophageal deviation -Voicemail messages left with Dr. Virgia Griffins on-call ENT with [facility] group -Patient will likely need a PEG/feeding tube   AKI (acute kidney injury) (HCC) //  Dehydration -IV fluid hydration -BMP in a.m.   Excessive secretion -Robinul  ordered   Head and neck cancer w/ mets  Tracheal deviation d/t mass -Per oncologist -Tracheal deviation  known  Jesus [name] 06/16/2023, 12:00 AM

## 2023-06-16 NOTE — Plan of Care (Signed)
   Problem: Health Behavior/Discharge Planning: Goal: Ability to manage health-related needs will improve Outcome: Progressing

## 2023-06-17 DIAGNOSIS — R1313 Dysphagia, pharyngeal phase: Secondary | ICD-10-CM | POA: Diagnosis not present

## 2023-06-17 LAB — CBC
HCT: 23.6 % — ABNORMAL LOW (ref 39.0–52.0)
Hemoglobin: 7 g/dL — ABNORMAL LOW (ref 13.0–17.0)
MCH: 25.4 pg — ABNORMAL LOW (ref 26.0–34.0)
MCHC: 29.7 g/dL — ABNORMAL LOW (ref 30.0–36.0)
MCV: 85.5 fL (ref 80.0–100.0)
Platelets: 489 10*3/uL — ABNORMAL HIGH (ref 150–400)
RBC: 2.76 MIL/uL — ABNORMAL LOW (ref 4.22–5.81)
RDW: 13.8 % (ref 11.5–15.5)
WBC: 6.2 10*3/uL (ref 4.0–10.5)
nRBC: 0 % (ref 0.0–0.2)

## 2023-06-17 LAB — BASIC METABOLIC PANEL
Anion gap: 9 (ref 5–15)
BUN: 14 mg/dL (ref 8–23)
CO2: 23 mmol/L (ref 22–32)
Calcium: 8.7 mg/dL — ABNORMAL LOW (ref 8.9–10.3)
Chloride: 105 mmol/L (ref 98–111)
Creatinine, Ser: 1 mg/dL (ref 0.61–1.24)
GFR, Estimated: >60 mL/min (ref >60)
Glucose, Bld: 108 mg/dL — ABNORMAL HIGH (ref 70–99)
Potassium: 3.8 mmol/L (ref 3.5–5.1)
Sodium: 137 mmol/L (ref 135–145)

## 2023-06-17 MED ORDER — POTASSIUM CHLORIDE IN NACL 20-0.9 MEQ/L-% IV SOLN
INTRAVENOUS | Status: AC
  Filled 2023-06-17 (×2): qty 1000

## 2023-06-17 NOTE — Progress Notes (Signed)
 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  Patient on immunotherapy Keytruda .  He has a history of HLD, HTN, resolved as he has had significant weight loss.  He has had progressive dysphagia, difficulty swallowing solids.  He follows with ENT Dr. Donalee Fruits. He presented with inability to swallow solids or liquids. Admitted for the same.     Assessment & Plan:   Dysphagia w/ esophageal deviation: - Main reason for admission.  Still with difficulty eating or drinking anything.   - Spoke with Dr. Tellis Feathers on-call ENT, who had spoken with Dr. Donalee Fruits, patient's ENT - Difficult surgery.  Plan will be for outpt surgery per ENT - recommended PEG/feeding tube for strengthening/nutrition beforehand.  Would likely need to be surgical tube.  Consulted surgery today for this, appreciate input/recommendations.   - surgery would be scheduled for early next week.  Will allow diet until then.  Pneumonia //  Immunocompromised patient - no fevers/chills/cough.  Unclear whether actually PNA or not, CT concerning for the same. Regardless, treating as such due to immunocompromise status. -Pneumonia order set initiated -Blood cultures x 2 ordered -IV cefepime  and vancomycin  on board    AKI (acute kidney injury) (HCC) //  Dehydration -IV fluid hydration - resolved     Excessive secretion -Robinul  ordered.  Has been helpful.  He is able to maintain his secretions in house    Head and neck cancer w/ mets  Tracheal deviation d/t mass -Per oncologist -Tracheal deviation known  Normocytic anemia: - longstanding, likely chronic disease - with notable drop in Hgb to 7 today.  Baseline 9-10.  - repeat tomorrow, transfuse if <7   DVT prophylaxis:  SCDs Start: 06/16/23 0122  Code Status:    Code Status: Full Code Level of care: Med-Surg Status is: Inpatient Remains inpatient appropriate because: inability to tolerate PO, possible PEG placement.    Consults called: ENT, surgery tomorrow for consideration of PEG tube    Subjective: Patient awake and alert.  NO complaints, just thirsty.  IVF going.  We discussed options after having spoke with the ENT.  He desires having feeding tube placed.  Family in room and in agreement.  Breathing well.  He is able to control his saliva/no drooling.  He would like to try full liquids this evening.  Objective: Vitals:   06/17/23 0127 06/17/23 0528 06/17/23 1009 06/17/23 1406  BP: 125/65 122/67 130/83 (!) 148/84  Pulse: 100 (!) 103 95 88  Resp: 18 16 16 17   Temp: 98.8 F (37.1 C) 98.2 F (36.8 C) 98 F (36.7 C) 98 F (36.7 C)  TempSrc: Oral Oral    SpO2: 96% 97% 100% 100%  Weight:      Height:        Intake/Output Summary (Last 24 hours) at 06/17/2023 1430 Last data filed at 06/17/2023 0600 Gross per 24 hour  Intake 1355.36 ml  Output --  Net 1355.36 ml   Filed Weights   06/16/23 0810  Weight: 108.4 kg   Body mass index is 32.41 kg/m.  Gen: 68 y.o. male in no apparent distress.  Nontoxic Neck: Fullness noted particularly on the right side.  No stridor.  No drooling. Pulm: Non-labored breathing.  Clear to auscultation bilaterally.  CV: Regular rate and rhythm. No murmur, rub, or gallop. No JVD  GI: Abdomen soft, non-tender, non-distended, with normoactive bowel sounds. No organomegaly or masses felt. Ext: Warm, no deformities, no pedal edema Skin: No rashes, lesions no ulcers Neuro: Alert and oriented. No focal neurological deficits. Psych: Calm  Judgement and insight appear normal. Mood & affect appropriate.     I have personally reviewed the following labs and images: CBC: Recent Labs  Lab 06/13/23 1452 06/15/23 1916 06/16/23 0422 06/17/23 0420  WBC 8.1 9.5 7.4 6.2  NEUTROABS 5.9  --  5.0  --   HGB 8.1* 8.2*  7.2* 7.0*  HCT 26.8* 27.8* 23.8* 23.6*  MCV 83.5 83.7 85.6 85.5  PLT 524* 540* 469* 489*   BMP &GFR Recent Labs  Lab 06/13/23 1452 06/15/23 1916 06/16/23 0422 06/17/23 0420  NA 136 136 135 137  K 4.0 3.9 3.8 3.8  CL 98 98 101 105  CO2 32 27 24 23   GLUCOSE 149* 136* 119* 108*  BUN 18 18 16 14   CREATININE 1.20 1.26* 0.98 1.00  CALCIUM  9.6 9.5 8.6* 8.7*   Estimated Creatinine Clearance: 91.1 mL/min (by C-G formula based on SCr of 1 mg/dL). Liver & Pancreas: Recent Labs  Lab 06/13/23 1452 06/15/23 1916  AST 10* 12*  ALT 8 12  ALKPHOS 82 76  BILITOT 0.4 0.7  PROT 8.5* 8.7*  ALBUMIN 3.8 3.5   No results for input(s): "LIPASE", "AMYLASE" in the last 168 hours. No results for input(s): "AMMONIA" in the last 168 hours. Diabetic: No results for input(s): "HGBA1C" in the last 72 hours. No results for input(s): "GLUCAP" in the last 168 hours. Cardiac Enzymes: No results for input(s): "CKTOTAL", "CKMB", "CKMBINDEX", "TROPONINI" in the last 168 hours. No results for input(s): "PROBNP" in the last 8760 hours. Coagulation Profile: No results for input(s): "INR", "PROTIME" in the last 168 hours. Thyroid  Function Tests: Recent Labs    06/16/23 0052  TSH 4.054   Lipid Profile: No results for input(s): "CHOL", "HDL", "LDLCALC", "TRIG", "CHOLHDL", "LDLDIRECT" in the last 72 hours. Anemia Panel: No results for input(s): "VITAMINB12", "FOLATE", "FERRITIN", "TIBC", "IRON", "RETICCTPCT" in the last 72 hours. Urine analysis:    Component Value Date/Time   COLORURINE YELLOW 08/19/2022 1248   APPEARANCEUR CLEAR 08/19/2022 1248   LABSPEC 1.021 08/19/2022 1248   PHURINE 5.0 08/19/2022 1248   GLUCOSEU 50 (A) 08/19/2022 1248   HGBUR NEGATIVE 08/19/2022 1248   BILIRUBINUR NEGATIVE 08/19/2022 1248   BILIRUBINUR negative 12/22/2017 1545   KETONESUR NEGATIVE 08/19/2022 1248   PROTEINUR 30 (A) 08/19/2022 1248   UROBILINOGEN 1.0 12/22/2017 1545   NITRITE NEGATIVE 08/19/2022 1248    LEUKOCYTESUR NEGATIVE 08/19/2022 1248   Sepsis Labs: Invalid input(s): "PROCALCITONIN", "LACTICIDVEN"  Microbiology: Recent Results (from the past 240 hours)  Resp panel by RT-PCR (RSV, Flu A&B, Covid) Anterior Nasal Swab     Status: None   Collection Time: 06/15/23  7:16 PM   Specimen: Anterior Nasal Swab  Result Value Ref Range Status   SARS Coronavirus 2 by RT PCR NEGATIVE NEGATIVE Final    Comment: (NOTE) SARS-CoV-2 target nucleic acids are NOT DETECTED.  The SARS-CoV-2 RNA is generally detectable in upper respiratory specimens during the acute phase of infection. The lowest concentration of SARS-CoV-2 viral copies this assay can detect is 138 copies/mL. A negative result does not preclude SARS-Cov-2 infection and should not be used as the sole basis for treatment or other patient management decisions. A negative result may occur with  improper specimen collection/handling, submission of specimen other than nasopharyngeal swab, presence  of viral mutation(s) within the areas targeted by this assay, and inadequate number of viral copies(<138 copies/mL). A negative result must be combined with clinical observations, patient history, and epidemiological information. The expected result is Negative.  Fact Sheet for Patients:  BloggerCourse.com  Fact Sheet for Healthcare Providers:  SeriousBroker.it  This test is no t yet approved or cleared by the [place]  FDA and  has been authorized for detection and/or diagnosis of SARS-CoV-2 by FDA under an Emergency Use Authorization (EUA). This EUA will remain  in effect (meaning this test can be used) for the duration of the COVID-19 declaration under Section 564(b)(1) of the Act, 21 U.S.C.section 360bbb-3(b)(1), unless the authorization is terminated  or revoked sooner.       Influenza A by PCR NEGATIVE NEGATIVE Final   Influenza B by PCR NEGATIVE NEGATIVE Final    Comment:  (NOTE) The Xpert Xpress SARS-CoV-2/FLU/RSV plus assay is intended as an aid in the diagnosis of influenza from Nasopharyngeal swab specimens and should not be used as a sole basis for treatment. Nasal washings and aspirates are unacceptable for Xpert Xpress SARS-CoV-2/FLU/RSV testing.  Fact Sheet for Patients: BloggerCourse.com  Fact Sheet for Healthcare Providers: SeriousBroker.it  This test is not yet approved or cleared by the [place]  FDA and has been authorized for detection and/or diagnosis of SARS-CoV-2 by FDA under an Emergency Use Authorization (EUA). This EUA will remain in effect (meaning this test can be used) for the duration of the COVID-19 declaration under Section 564(b)(1) of the Act, 21 U.S.C. section 360bbb-3(b)(1), unless the authorization is terminated or revoked.     Resp Syncytial Virus by PCR NEGATIVE NEGATIVE Final    Comment: (NOTE) Fact Sheet for Patients: BloggerCourse.com  Fact Sheet for Healthcare Providers: SeriousBroker.it  This test is not yet approved or cleared by the [place]  FDA and has been authorized for detection and/or diagnosis of SARS-CoV-2 by FDA under an Emergency Use Authorization (EUA). This EUA will remain in effect (meaning this test can be used) for the duration of the COVID-19 declaration under Section 564(b)(1) of the Act, 21 U.S.C. section 360bbb-3(b)(1), unless the authorization is terminated or revoked.  Performed at [facility], [address] [place], [place] [address]   MRSA Next Gen by PCR, Nasal     Status: None   Collection Time: 06/16/23 10:03 AM   Specimen: Nasal Mucosa; Nasal Swab  Result Value Ref Range Status   MRSA by PCR Next Gen NOT DETECTED NOT DETECTED Final    Comment: (NOTE) The GeneXpert MRSA Assay (FDA approved for NASAL specimens only), is one component of a  comprehensive MRSA colonization surveillance program. It is not intended to diagnose MRSA infection nor to guide or monitor treatment for MRSA infections. Test performance is not FDA approved in patients less than 43 years old. Performed at [facility], [address] [place], [place] [address]     Radiology Studies: No results found.   Scheduled Meds:  Chlorhexidine  Gluconate Cloth  6 each Topical Daily   sodium chloride  flush  10-40 mL Intracatheter Q12H   Continuous Infusions:  0.9 % NaCl with KCl 20 mEq / L 75 mL/hr at 06/17/23 1023   ceFEPime  (MAXIPIME ) IV 2 g (06/17/23 0839)   vancomycin  1,000 mg (06/17/23 1024)     LOS: 1 day   35 minutes with more than 50% spent in reviewing records, counseling patient/family and coordinating care.  Trenton Frock, MD [facility] [email or web address] 06/17/2023,  2:30 PM

## 2023-06-17 NOTE — Plan of Care (Signed)
   Problem: Coping: Goal: Level of anxiety will decrease Outcome: Progressing   Problem: Pain Managment: Goal: General experience of comfort will improve and/or be controlled Outcome: Progressing

## 2023-06-17 NOTE — H&P (Addendum)
 CC/Reason for consult: Surgical placement of gastrostomy tube  Requesting physician: Earlean Glaze, MD  HPI: Jesus [name] is an 68 y.o. 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  He reports progressive dysphagia particularly with solid foods and has been following actively with Dr. Donalee Fruits.  He notes difficulty swallowing at present even with things like saliva.  Dr. Betsey Brow indicates that he spoke over the phone with Dr. Tellis Feathers with ENT who has recommended surgical evaluation for placement of gastrostomy tube over the coming days to assist in maintenance of nutrition.  Currently, he reports no abdominal pain.  He acknowledges dysphagia to solids and liquids.  He does note that intermittently he will have trouble even tolerating saliva.  He denies any prior abdominal surgical history.  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- last radiation 06-2018   Cataract    removed both eyes with l;ens implants    COPD (chronic obstructive pulmonary disease) (HCC)    Family history of colon cancer    in his brother- dx'd age 44    History of chemotherapy    last 06-01-2018   History of kidney stones    History of radiation therapy    last radiation 06-2018   Hyperlipidemia    currently under control   Hypertension    Irregular heart beats    Lymphadenopathy    Pain, lower leg    Bilateral   Peripheral arterial disease (HCC)    Pre-diabetes    Red-green color blindness    RLS (restless legs syndrome)    Snores    Wears glasses     Past Surgical History:  Procedure Laterality Date   CATARACT EXTRACTION W/ INTRAOCULAR LENS  IMPLANT, BILATERAL     COLONOSCOPY     DIRECT LARYNGOSCOPY  Right 02/03/2022   Procedure: DIRECT LARYNGOSCOPY WITH BIOPSY OF RIGHT FALSE VOCAL CORD;  Surgeon: Ammon Bales, MD;  Location: [facility] OR;  Service: ENT;  Laterality: Right;   dislodged salava stone     FRACTURE SURGERY     HAND ARTHROPLASTY  1995   crushed left hand   INGUINAL LYMPH NODE BIOPSY Left 01/02/2018   Procedure: LEFT INGUINAL LYMPH NODE BIOPSY;  Surgeon: Enid Harry, MD;  Location: [facility] OR;  Service: General;  Laterality: Left;   IR IMAGING GUIDED PORT INSERTION  01/15/2018   IR IMAGING GUIDED PORT INSERTION  08/10/2022   IR REMOVAL TUN ACCESS W/ PORT W/O FL MOD SED  03/11/2019   MEDIASTINOSCOPY N/A 07/22/2022   Procedure: MEDIASTINOSCOPY;  Surgeon: Zelphia Higashi, MD;  Location: [facility] OR;  Service: Thoracic;  Laterality: N/A;   MICROLARYNGOSCOPY Left 01/17/2014   Procedure: MICROLARYNGOSCOPY WITH EXCISION OF THE BIOPSY OF LEFT VOCAL CORD LESION;  Surgeon: Janita Mellow, MD;  Location: [facility];  Service: ENT;  Laterality: Left;   MICROLARYNGOSCOPY N/A 09/16/2020   Procedure: MICROLARYNGOSCOPY with Biopsy of vocal cord lesion;  Surgeon: Ammon Bales, MD;  Location: [facility] OR;  Service: ENT;  Laterality: N/A;   ORIF FOOT FRACTURE  2005   left   REFRACTIVE SURGERY Right    removed cloudiness in right eye after  cataract removal     Family History  Problem Relation Age of Onset   Breast cancer Mother    Diabetes Father    Hypertension Father    Stroke Father    Mental illness Sister    Hypertension Daughter    Mental illness Daughter    Hypertension Brother    Colon cancer Brother 26       passed away 2018-11-10   Breast cancer Sister    Esophageal cancer Neg Hx    Colon polyps Neg Hx    Rectal cancer Neg Hx    Stomach cancer Neg Hx     Social:  reports that he quit smoking about 2 years ago. His smoking use included cigarettes. He started smoking about 38 years ago. He has a 18 pack-year smoking history. He has never used smokeless tobacco. He reports that  he does not currently use alcohol after a past usage of about 6.0 standard drinks of alcohol per week. He reports current drug use. Drugs: , Marijuana, and Cocaine.  Allergies:  Allergies  Allergen Reactions   Bee Venom Anaphylaxis   Lisinopril  Other (See Comments)    Swelling in face   Antifungal [Miconazole Nitrate] Other (See Comments)     made his head hurt   Zolpidem Tartrate Er Other (See Comments)    Leg cramps    Medications: I have reviewed the patient's current medications.  Results for orders placed or performed during the hospital encounter of 06/15/23 (from the past 48 hours)  Resp panel by RT-PCR (RSV, Flu A&B, Covid) Anterior Nasal Swab     Status: None   Collection Time: 06/15/23  7:16 PM   Specimen: Anterior Nasal Swab  Result Value Ref Range   SARS Coronavirus 2 by RT PCR NEGATIVE NEGATIVE    Comment: (NOTE) SARS-CoV-2 target nucleic acids are NOT DETECTED.  The SARS-CoV-2 RNA is generally detectable in upper respiratory specimens during the acute phase of infection. The lowest concentration of SARS-CoV-2 viral copies this assay can detect is 138 copies/mL. A negative result does not preclude SARS-Cov-2 infection and should not be used as the sole basis for treatment or other patient management decisions. A negative result may occur with  improper specimen collection/handling, submission of specimen other than nasopharyngeal swab, presence of viral mutation(s) within the areas targeted by this assay, and inadequate number of viral copies(<138 copies/mL). A negative result must be combined with clinical observations, patient history, and epidemiological information. The expected result is Negative.  Fact Sheet for Patients:  BloggerCourse.com  Fact Sheet for Healthcare Providers:  SeriousBroker.it  This test is no t yet approved or cleared by the [place]  FDA and  has been authorized for detection  and/or diagnosis of SARS-CoV-2 by FDA under an Emergency Use Authorization (EUA). This EUA will remain  in effect (meaning this test can be used) for the duration of the COVID-19 declaration under Section 564(b)(1) of the Act, 21 U.S.C.section 360bbb-3(b)(1), unless the authorization is terminated  or revoked sooner.       Influenza A by PCR NEGATIVE NEGATIVE   Influenza B by PCR NEGATIVE NEGATIVE    Comment: (NOTE) The Xpert Xpress SARS-CoV-2/FLU/RSV plus assay is intended as an aid in the diagnosis of influenza from Nasopharyngeal swab specimens and should not be used as a sole basis for treatment. Nasal washings and aspirates are unacceptable for Xpert Xpress SARS-CoV-2/FLU/RSV testing.  Fact Sheet for Patients: BloggerCourse.com  Fact Sheet for Healthcare Providers: SeriousBroker.it  This test is  not yet approved or cleared by the [place]  FDA and has been authorized for detection and/or diagnosis of SARS-CoV-2 by FDA under an Emergency Use Authorization (EUA). This EUA will remain in effect (meaning this test can be used) for the duration of the COVID-19 declaration under Section 564(b)(1) of the Act, 21 U.S.C. section 360bbb-3(b)(1), unless the authorization is terminated or revoked.     Resp Syncytial Virus by PCR NEGATIVE NEGATIVE    Comment: (NOTE) Fact Sheet for Patients: BloggerCourse.com  Fact Sheet for Healthcare Providers: SeriousBroker.it  This test is not yet approved or cleared by the [place]  FDA and has been authorized for detection and/or diagnosis of SARS-CoV-2 by FDA under an Emergency Use Authorization (EUA). This EUA will remain in effect (meaning this test can be used) for the duration of the COVID-19 declaration under Section 564(b)(1) of the Act, 21 U.S.C. section 360bbb-3(b)(1), unless the authorization is terminated  or revoked.  Performed at [facility], [address], [place], [place] [address]   CBC     Status: Abnormal   Collection Time: 06/15/23  7:16 PM  Result Value Ref Range   WBC 9.5 4.0 - 10.5 K/uL   RBC 3.32 (L) 4.22 - 5.81 MIL/uL   Hemoglobin 8.2 (L) 13.0 - 17.0 g/dL   HCT 60.4 (L) 54.0 - 98.1 %   MCV 83.7 80.0 - 100.0 fL   MCH 24.7 (L) 26.0 - 34.0 pg   MCHC 29.5 (L) 30.0 - 36.0 g/dL   RDW 19.1 47.8 - 29.5 %   Platelets 540 (H) 150 - 400 K/uL   nRBC 0.0 0.0 - 0.2 %    Comment: Performed at [facility], [address] [place], [place] [address]  Comprehensive metabolic panel     Status: Abnormal   Collection Time: 06/15/23  7:16 PM  Result Value Ref Range   Sodium 136 135 - 145 mmol/L   Potassium 3.9 3.5 - 5.1 mmol/L   Chloride 98 98 - 111 mmol/L   CO2 27 22 - 32 mmol/L   Glucose, Bld 136 (H) 70 - 99 mg/dL    Comment: Glucose reference range applies only to samples taken after fasting for at least 8 hours.   BUN 18 8 - 23 mg/dL   Creatinine, Ser 8.65 (H) 0.61 - 1.24 mg/dL   Calcium  9.5 8.9 - 10.3 mg/dL   Total Protein 8.7 (H) 6.5 - 8.1 g/dL   Albumin 3.5 3.5 - 5.0 g/dL   AST 12 (L) 15 - 41 U/L   ALT 12 0 - 44 U/L   Alkaline Phosphatase 76 38 - 126 U/L   Total Bilirubin 0.7 0.0 - 1.2 mg/dL   GFR, Estimated >78 >46 mL/min    Comment: (NOTE) Calculated using the CKD-EPI Creatinine Equation (2021)    Anion gap 11 5 - 15    Comment: Performed at [facility], [address], [place], [place] [address]  Troponin I (High Sensitivity)     Status: None   Collection Time: 06/15/23  7:16 PM  Result Value Ref Range   Troponin I (High Sensitivity) 9 <18 ng/L    Comment: (NOTE) Elevated high sensitivity troponin I (hsTnI) values and significant  changes across serial measurements may suggest ACS but many other  chronic and acute conditions are known to elevate hsTnI results.  Refer to the "Links" section for chest pain  algorithms and additional  guidance. Performed at [facility], [address] [place], [place] [address]   Lactic acid, plasma     Status: None   Collection Time: 06/15/23  8:34 PM  Result Value Ref Range   Lactic Acid, Venous 0.7 0.5 - 1.9 mmol/L    Comment: Performed at [facility], [address], [place], [place] [address]  TSH     Status: None   Collection Time: 06/16/23 12:52 AM  Result Value Ref Range   TSH 4.054 0.350 - 4.500 uIU/mL    Comment: Performed by a 3rd Generation assay with a functional sensitivity of <=0.01 uIU/mL. Performed at [facility], [address], [place], [place] [address]   Basic metabolic panel     Status: Abnormal   Collection Time: 06/16/23  4:22 AM  Result Value Ref Range   Sodium 135 135 - 145 mmol/L   Potassium 3.8 3.5 - 5.1 mmol/L   Chloride 101 98 - 111 mmol/L   CO2 24 22 - 32 mmol/L   Glucose, Bld 119 (H) 70 - 99 mg/dL    Comment: Glucose reference range applies only to samples taken after fasting for at least 8 hours.   BUN 16 8 - 23 mg/dL   Creatinine, Ser 9.14 0.61 - 1.24 mg/dL   Calcium  8.6 (L) 8.9 - 10.3 mg/dL   GFR, Estimated >78 >29 mL/min    Comment: (NOTE) Calculated using the CKD-EPI Creatinine Equation (2021)    Anion gap 10 5 - 15    Comment: Performed at [facility], [address], [place], [place] [address]  CBC with Differential/Platelet     Status: Abnormal   Collection Time: 06/16/23  4:22 AM  Result Value Ref Range   WBC 7.4 4.0 - 10.5 K/uL   RBC 2.78 (L) 4.22 - 5.81 MIL/uL   Hemoglobin 7.2 (L) 13.0 - 17.0 g/dL   HCT 08.6 (L) 57.8 - 46.9 %   MCV 85.6 80.0 - 100.0 fL   MCH 25.9 (L) 26.0 - 34.0 pg   MCHC 30.3 30.0 - 36.0 g/dL   RDW 62.9 52.8 - 41.3 %   Platelets 469 (H) 150 - 400 K/uL   nRBC 0.0 0.0 - 0.2 %   Neutrophils Relative % 67 %   Neutro Abs 5.0 1.7 - 7.7 K/uL   Lymphocytes Relative 22 %   Lymphs Abs 1.6 0.7 - 4.0 K/uL    Monocytes Relative 9 %   Monocytes Absolute 0.7 0.1 - 1.0 K/uL   Eosinophils Relative 1 %   Eosinophils Absolute 0.1 0.0 - 0.5 K/uL   Basophils Relative 1 %   Basophils Absolute 0.1 0.0 - 0.1 K/uL   Immature Granulocytes 0 %   Abs Immature Granulocytes 0.02 0.00 - 0.07 K/uL    Comment: Performed at [facility], [address], [place], [place] [address]  MRSA Next Gen by PCR, Nasal     Status: None   Collection Time: 06/16/23 10:03 AM   Specimen: Nasal Mucosa; Nasal Swab  Result Value Ref Range   MRSA by PCR Next Gen NOT DETECTED NOT DETECTED    Comment: (NOTE) The GeneXpert MRSA Assay (FDA approved for NASAL specimens only), is one component of a comprehensive MRSA colonization surveillance program. It is not intended to diagnose MRSA infection nor to guide or monitor treatment for MRSA infections. Test performance is not FDA approved in patients less than 37 years old. Performed at [facility], [address] [place], [place] [address]  Basic metabolic panel     Status: Abnormal   Collection Time: 06/17/23  4:20 AM  Result Value Ref Range   Sodium 137 135 - 145 mmol/L   Potassium 3.8 3.5 - 5.1 mmol/L   Chloride 105 98 - 111 mmol/L   CO2 23 22 - 32 mmol/L   Glucose, Bld 108 (H) 70 - 99 mg/dL    Comment: Glucose reference range applies only to samples taken after fasting for at least 8 hours.   BUN 14 8 - 23 mg/dL   Creatinine, Ser 1.61 0.61 - 1.24 mg/dL   Calcium  8.7 (L) 8.9 - 10.3 mg/dL   GFR, Estimated >09 >60 mL/min    Comment: (NOTE) Calculated using the CKD-EPI Creatinine Equation (2021)    Anion gap 9 5 - 15    Comment: Performed at [facility], [address] [place], [place] [address]  CBC     Status: Abnormal   Collection Time: 06/17/23  4:20 AM  Result Value Ref Range   WBC 6.2 4.0 - 10.5 K/uL   RBC 2.76 (L) 4.22 - 5.81 MIL/uL   Hemoglobin 7.0 (L) 13.0 - 17.0 g/dL   HCT 81.1 (L) 91.4 - 78.2 %    MCV 85.5 80.0 - 100.0 fL   MCH 25.4 (L) 26.0 - 34.0 pg   MCHC 29.7 (L) 30.0 - 36.0 g/dL   RDW 95.6 21.3 - 08.6 %   Platelets 489 (H) 150 - 400 K/uL   nRBC 0.0 0.0 - 0.2 %    Comment: Performed at [facility], [address], [place], [place] [address]    CT Soft Tissue Neck W Contrast Result Date: 06/15/2023 CLINICAL DATA:  Shortness of breath with hemoptysis EXAM: CT NECK WITH CONTRAST TECHNIQUE: Multidetector CT imaging of the neck was performed using the standard protocol following the bolus administration of intravenous contrast. RADIATION DOSE REDUCTION: This exam was performed according to the departmental dose-optimization program which includes automated exposure control, adjustment of the mA and/or kV according to patient size and/or use of iterative reconstruction technique. CONTRAST:  75mL OMNIPAQUE  IOHEXOL  350 MG/ML SOLN COMPARISON:  05/05/2022 CT neck 04/14/2023 PET CT FINDINGS: Pharynx and larynx: At the level of the lower glottis and inferior aspect of the thyroid  gland, there is circumferential soft tissue abnormality surrounding the trachea and posteriorly displacing the esophagus. This masslike region measures approximately 4.0 x 4.2 x 5.2 cm. The mass appears more infiltrative of the surrounding tissues with somewhat indistinct margins suggestive of inflammation. Salivary glands: No inflammation, mass, or stone. Thyroid : Above described soft tissue abnormality at the level of the thoracic inlet is continuous with the lower left thyroid  lobe. This causes marked rightward deviation of the trachea, which remains patent. Lymph nodes: Previously visualized enlarged lymph nodes of the right cervical chain have resolved. Unchanged left level 3 node measuring 6 mm (2:67). No enlarged or abnormal density nodes. Vascular: Right IJ approach central venous catheter with tip below the field of view (Port-A-Cath), with access needle. Limited intracranial: Normal Visualized orbits:  Normal Mastoids and visualized paranasal sinuses: Clear. Skeleton: No acute or aggressive process. Upper chest: Negative. Other: None. IMPRESSION: 1. Circumferential soft tissue abnormality surrounding the trachea and posteriorly displacing the esophagus at the level of the thoracic inlet, worsened since 04/14/2023 and consistent with progression of glottic squamous cell carcinoma. 2. Area inflammatory change associated with the above mass is likely a post radiation effect. There may also be a superimposed degree of radiation-induced  thyroiditis. 3. Previously visualized enlarged lymph nodes of the right cervical chain have resolved. Electronically Signed   By: Juanetta Nordmann M.D.   On: 06/15/2023 23:01   CT Angio Chest PE W and/or Wo Contrast Result Date: 06/15/2023 CLINICAL DATA:  Pulmonary embolus suspected with high probability. Shortness of breath, tachycardia, productive cough with hemoptysis. Cancer. Difficulty swallowing. Known throat cancer. EXAM: CT ANGIOGRAPHY CHEST WITH CONTRAST TECHNIQUE: Multidetector CT imaging of the chest was performed using the standard protocol during bolus administration of intravenous contrast. Multiplanar CT image reconstructions and MIPs were obtained to evaluate the vascular anatomy. RADIATION DOSE REDUCTION: This exam was performed according to the departmental dose-optimization program which includes automated exposure control, adjustment of the mA and/or kV according to patient size and/or use of iterative reconstruction technique. CONTRAST:  75mL OMNIPAQUE  IOHEXOL  350 MG/ML SOLN COMPARISON:  Chest radiograph 06/15/2023. PET-CT 04/14/2023. CT chest 05/05/2022 FINDINGS: Cardiovascular: Technically adequate study with good opacification of the central and segmental pulmonary arteries. Mild motion artifact. No focal filling defects. No evidence of significant pulmonary embolus. Normal heart size. No pericardial effusions. Normal caliber thoracic aorta. No aortic dissection.  Great vessel origins are patent. Mediastinum/Nodes: Right central venous catheter with tip at the cavoatrial junction. Eccentric esophageal or paraesophageal mass at the thoracic inlet measuring about 3.6 cm diameter. This displaces the trachea towards the right. This is consistent with known esophageal neoplasm. The lateral wall of the trachea is thickened and irregular, likely indicating direct invasion of the trachea. The distal esophagus is decompressed. Mildly enlarged mediastinal lymph nodes with pretracheal nodes measuring up to 10 mm short axis dimension. These are nonspecific. Lungs/Pleura: Patchy perihilar infiltrates could be due to motion artifact or may indicate mild edema or pneumonia. Bandlike opacities in the left lung base likely representing scarring. No pleural effusion or pneumothorax. Upper Abdomen: No acute abnormalities. Renal cysts, largest on the left measuring 5.7 cm diameter. No imaging follow-up is indicated. Musculoskeletal: Degenerative changes in the spine. No destructive bone lesions. Review of the MIP images confirms the above findings. IMPRESSION: 1. Esophageal mass of the level of the thoracic inlet, displacing the trachea. This likely corresponds to known cancer. The adjacent tracheal wall is irregular, likely representing direct tumor invasion. Nonspecific mediastinal lymph nodes could be metastatic. 2. No evidence of significant pulmonary embolus. 3. Patchy perihilar infiltrates may represent motion artifact, edema, or pneumonia. Electronically Signed   By: Boyce Byes M.D.   On: 06/15/2023 21:43   DG Chest 2 View Result Date: 06/15/2023 CLINICAL DATA:  Neck pain difficulty swallowing EXAM: CHEST - 2 VIEW COMPARISON:  08/16/2022 FINDINGS: Right-sided central venous port tip at the SVC. No acute airspace disease or effusion. Normal cardiac size. Rightward tracheal deviation with left paratracheal opacity. IMPRESSION: 1. No active cardiopulmonary disease. 2. Rightward  tracheal deviation with left paratracheal opacity,, presumably corresponding to mass seen on prior PET CT. Electronically Signed   By: Esmeralda Hedge M.D.   On: 06/15/2023 20:12   DG Neck Soft Tissue Result Date: 06/15/2023 CLINICAL DATA:  Neck pain difficulty swallowing EXAM: NECK SOFT TISSUES - 1+ VIEW COMPARISON:  Esophagram 06/09/2023, swallow studies 05/25/2023, 04/14/2023 FINDINGS: Normal prevertebral soft tissue thickness. No retropharyngeal thickening or gas. Slightly thickened appearance of the epiglottis and slightly hazy appearance of the glottic area. There may be slight narrowing of the subglottic trachea. Suspect similar appearance on recent swallowing exams. IMPRESSION: Slightly thickened appearance of the epiglottis and slightly hazy appearance of the glottic area suggesting mild edema or  inflammation. Consider correlation with direct visualization. Possible mild narrowing of the subglottic trachea as well. Electronically Signed   By: Esmeralda Hedge M.D.   On: 06/15/2023 20:10    ROS - all of the below systems have been reviewed with the patient and positives are indicated with bold text General: chills, fever or night sweats Eyes: blurry vision or double vision ENT: epistaxis or sore throat Allergy/Immunology: itchy/watery eyes or nasal congestion Hematologic/Lymphatic: bleeding problems, blood clots or swollen lymph nodes Endocrine: temperature intolerance or unexpected weight changes Breast: new or changing breast lumps or nipple discharge Resp: cough, shortness of breath, or wheezing CV: chest pain or dyspnea on exertion GI: as per HPI GU: dysuria, trouble voiding, or hematuria MSK: joint pain or joint stiffness Neuro: TIA or stroke symptoms Derm: pruritus and skin lesion changes Psych: anxiety and depression  PE Blood pressure 130/83, pulse 95, temperature 98 F (36.7 C), resp. rate 16, height 6' (1.829 m), weight 108.4 kg, SpO2 100%. Constitutional: NAD; conversant; up in  recliner watching TV, comfortable  Eyes: Moist conjunctiva Lungs: Normal respiratory effort; no tactile fremitus CV: RRR; no palpable thrills; no pitting edema GI: Abd soft, NT/ND Psychiatric: Appropriate affect  Results for orders placed or performed during the hospital encounter of 06/15/23 (from the past 48 hours)  Resp panel by RT-PCR (RSV, Flu A&B, Covid) Anterior Nasal Swab     Status: None   Collection Time: 06/15/23  7:16 PM   Specimen: Anterior Nasal Swab  Result Value Ref Range   SARS Coronavirus 2 by RT PCR NEGATIVE NEGATIVE    Comment: (NOTE) SARS-CoV-2 target nucleic acids are NOT DETECTED.  The SARS-CoV-2 RNA is generally detectable in upper respiratory specimens during the acute phase of infection. The lowest concentration of SARS-CoV-2 viral copies this assay can detect is 138 copies/mL. A negative result does not preclude SARS-Cov-2 infection and should not be used as the sole basis for treatment or other patient management decisions. A negative result may occur with  improper specimen collection/handling, submission of specimen other than nasopharyngeal swab, presence of viral mutation(s) within the areas targeted by this assay, and inadequate number of viral copies(<138 copies/mL). A negative result must be combined with clinical observations, patient history, and epidemiological information. The expected result is Negative.  Fact Sheet for Patients:  BloggerCourse.com  Fact Sheet for Healthcare Providers:  SeriousBroker.it  This test is no t yet approved or cleared by the [place]  FDA and  has been authorized for detection and/or diagnosis of SARS-CoV-2 by FDA under an Emergency Use Authorization (EUA). This EUA will remain  in effect (meaning this test can be used) for the duration of the COVID-19 declaration under Section 564(b)(1) of the Act, 21 U.S.C.section 360bbb-3(b)(1), unless the authorization  is terminated  or revoked sooner.       Influenza A by PCR NEGATIVE NEGATIVE   Influenza B by PCR NEGATIVE NEGATIVE    Comment: (NOTE) The Xpert Xpress SARS-CoV-2/FLU/RSV plus assay is intended as an aid in the diagnosis of influenza from Nasopharyngeal swab specimens and should not be used as a sole basis for treatment. Nasal washings and aspirates are unacceptable for Xpert Xpress SARS-CoV-2/FLU/RSV testing.  Fact Sheet for Patients: BloggerCourse.com  Fact Sheet for Healthcare Providers: SeriousBroker.it  This test is not yet approved or cleared by the [place]  FDA and has been authorized for detection and/or diagnosis of SARS-CoV-2 by FDA under an Emergency Use Authorization (EUA). This EUA will remain in effect (meaning this test  can be used) for the duration of the COVID-19 declaration under Section 564(b)(1) of the Act, 21 U.S.C. section 360bbb-3(b)(1), unless the authorization is terminated or revoked.     Resp Syncytial Virus by PCR NEGATIVE NEGATIVE    Comment: (NOTE) Fact Sheet for Patients: BloggerCourse.com  Fact Sheet for Healthcare Providers: SeriousBroker.it  This test is not yet approved or cleared by the [place]  FDA and has been authorized for detection and/or diagnosis of SARS-CoV-2 by FDA under an Emergency Use Authorization (EUA). This EUA will remain in effect (meaning this test can be used) for the duration of the COVID-19 declaration under Section 564(b)(1) of the Act, 21 U.S.C. section 360bbb-3(b)(1), unless the authorization is terminated or revoked.  Performed at [facility], [address], [place], [place] [address]   CBC     Status: Abnormal   Collection Time: 06/15/23  7:16 PM  Result Value Ref Range   WBC 9.5 4.0 - 10.5 K/uL   RBC 3.32 (L) 4.22 - 5.81 MIL/uL   Hemoglobin 8.2 (L) 13.0 - 17.0 g/dL   HCT  32.4 (L) 40.1 - 52.0 %   MCV 83.7 80.0 - 100.0 fL   MCH 24.7 (L) 26.0 - 34.0 pg   MCHC 29.5 (L) 30.0 - 36.0 g/dL   RDW 02.7 25.3 - 66.4 %   Platelets 540 (H) 150 - 400 K/uL   nRBC 0.0 0.0 - 0.2 %    Comment: Performed at [facility], [address] [place], [place] [address]  Comprehensive metabolic panel     Status: Abnormal   Collection Time: 06/15/23  7:16 PM  Result Value Ref Range   Sodium 136 135 - 145 mmol/L   Potassium 3.9 3.5 - 5.1 mmol/L   Chloride 98 98 - 111 mmol/L   CO2 27 22 - 32 mmol/L   Glucose, Bld 136 (H) 70 - 99 mg/dL    Comment: Glucose reference range applies only to samples taken after fasting for at least 8 hours.   BUN 18 8 - 23 mg/dL   Creatinine, Ser 4.25 (H) 0.61 - 1.24 mg/dL   Calcium  9.5 8.9 - 10.3 mg/dL   Total Protein 8.7 (H) 6.5 - 8.1 g/dL   Albumin 3.5 3.5 - 5.0 g/dL   AST 12 (L) 15 - 41 U/L   ALT 12 0 - 44 U/L   Alkaline Phosphatase 76 38 - 126 U/L   Total Bilirubin 0.7 0.0 - 1.2 mg/dL   GFR, Estimated >95 >63 mL/min    Comment: (NOTE) Calculated using the CKD-EPI Creatinine Equation (2021)    Anion gap 11 5 - 15    Comment: Performed at [facility] [address] [place], [place] [address]  Troponin I (High Sensitivity)     Status: None   Collection Time: 06/15/23  7:16 PM  Result Value Ref Range   Troponin I (High Sensitivity) 9 <18 ng/L    Comment: (NOTE) Elevated high sensitivity troponin I (hsTnI) values and significant  changes across serial measurements may suggest ACS but many other  chronic and acute conditions are known to elevate hsTnI results.  Refer to the "Links" section for chest pain algorithms and additional  guidance. Performed at [facility], [address], [place], [place] [address]   Lactic acid, plasma     Status: None   Collection Time: 06/15/23  8:34 PM  Result Value Ref Range   Lactic Acid, Venous 0.7 0.5 - 1.9  mmol/L    Comment: Performed at  [facility], [address], [place], [place] [address]  TSH     Status: None   Collection Time: 06/16/23 12:52 AM  Result Value Ref Range   TSH 4.054 0.350 - 4.500 uIU/mL    Comment: Performed by a 3rd Generation assay with a functional sensitivity of <=0.01 uIU/mL. Performed at [facility], [address] [place], [place] [address]   Basic metabolic panel     Status: Abnormal   Collection Time: 06/16/23  4:22 AM  Result Value Ref Range   Sodium 135 135 - 145 mmol/L   Potassium 3.8 3.5 - 5.1 mmol/L   Chloride 101 98 - 111 mmol/L   CO2 24 22 - 32 mmol/L   Glucose, Bld 119 (H) 70 - 99 mg/dL    Comment: Glucose reference range applies only to samples taken after fasting for at least 8 hours.   BUN 16 8 - 23 mg/dL   Creatinine, Ser 2.95 0.61 - 1.24 mg/dL   Calcium  8.6 (L) 8.9 - 10.3 mg/dL   GFR, Estimated >62 >13 mL/min    Comment: (NOTE) Calculated using the CKD-EPI Creatinine Equation (2021)    Anion gap 10 5 - 15    Comment: Performed at [facility], [address] [place], [place] [address]  CBC with Differential/Platelet     Status: Abnormal   Collection Time: 06/16/23  4:22 AM  Result Value Ref Range   WBC 7.4 4.0 - 10.5 K/uL   RBC 2.78 (L) 4.22 - 5.81 MIL/uL   Hemoglobin 7.2 (L) 13.0 - 17.0 g/dL   HCT 84.6 (L) 96.2 - 95.2 %   MCV 85.6 80.0 - 100.0 fL   MCH 25.9 (L) 26.0 - 34.0 pg   MCHC 30.3 30.0 - 36.0 g/dL   RDW 84.1 32.4 - 40.1 %   Platelets 469 (H) 150 - 400 K/uL   nRBC 0.0 0.0 - 0.2 %   Neutrophils Relative % 67 %   Neutro Abs 5.0 1.7 - 7.7 K/uL   Lymphocytes Relative 22 %   Lymphs Abs 1.6 0.7 - 4.0 K/uL   Monocytes Relative 9 %   Monocytes Absolute 0.7 0.1 - 1.0 K/uL   Eosinophils Relative 1 %   Eosinophils Absolute 0.1 0.0 - 0.5 K/uL   Basophils Relative 1 %   Basophils Absolute 0.1 0.0 - 0.1 K/uL   Immature Granulocytes 0 %   Abs Immature Granulocytes 0.02 0.00 - 0.07 K/uL    Comment:  Performed at [facility], [address], [place], [place] [address]  MRSA Next Gen by PCR, Nasal     Status: None   Collection Time: 06/16/23 10:03 AM   Specimen: Nasal Mucosa; Nasal Swab  Result Value Ref Range   MRSA by PCR Next Gen NOT DETECTED NOT DETECTED    Comment: (NOTE) The GeneXpert MRSA Assay (FDA approved for NASAL specimens only), is one component of a comprehensive MRSA colonization surveillance program. It is not intended to diagnose MRSA infection nor to guide or monitor treatment for MRSA infections. Test performance is not FDA approved in patients less than 50 years old. Performed at [facility], [address] [place], [place] [address]   Basic metabolic panel     Status: Abnormal   Collection Time: 06/17/23  4:20 AM  Result Value Ref Range   Sodium 137 135 - 145 mmol/L   Potassium 3.8 3.5 -  5.1 mmol/L   Chloride 105 98 - 111 mmol/L   CO2 23 22 - 32 mmol/L   Glucose, Bld 108 (H) 70 - 99 mg/dL    Comment: Glucose reference range applies only to samples taken after fasting for at least 8 hours.   BUN 14 8 - 23 mg/dL   Creatinine, Ser 1.61 0.61 - 1.24 mg/dL   Calcium  8.7 (L) 8.9 - 10.3 mg/dL   GFR, Estimated >09 >60 mL/min    Comment: (NOTE) Calculated using the CKD-EPI Creatinine Equation (2021)    Anion gap 9 5 - 15    Comment: Performed at [facility], [address] [place], [place] [address]  CBC     Status: Abnormal   Collection Time: 06/17/23  4:20 AM  Result Value Ref Range   WBC 6.2 4.0 - 10.5 K/uL   RBC 2.76 (L) 4.22 - 5.81 MIL/uL   Hemoglobin 7.0 (L) 13.0 - 17.0 g/dL   HCT 81.1 (L) 91.4 - 78.2 %   MCV 85.5 80.0 - 100.0 fL   MCH 25.4 (L) 26.0 - 34.0 pg   MCHC 29.7 (L) 30.0 - 36.0 g/dL   RDW 95.6 21.3 - 08.6 %   Platelets 489 (H) 150 - 400 K/uL   nRBC 0.0 0.0 - 0.2 %    Comment: Performed at [facility], [address] [place], [place] [address]    CT Soft  Tissue Neck W Contrast Result Date: 06/15/2023 CLINICAL DATA:  Shortness of breath with hemoptysis EXAM: CT NECK WITH CONTRAST TECHNIQUE: Multidetector CT imaging of the neck was performed using the standard protocol following the bolus administration of intravenous contrast. RADIATION DOSE REDUCTION: This exam was performed according to the departmental dose-optimization program which includes automated exposure control, adjustment of the mA and/or kV according to patient size and/or use of iterative reconstruction technique. CONTRAST:  75mL OMNIPAQUE  IOHEXOL  350 MG/ML SOLN COMPARISON:  05/05/2022 CT neck 04/14/2023 PET CT FINDINGS: Pharynx and larynx: At the level of the lower glottis and inferior aspect of the thyroid  gland, there is circumferential soft tissue abnormality surrounding the trachea and posteriorly displacing the esophagus. This masslike region measures approximately 4.0 x 4.2 x 5.2 cm. The mass appears more infiltrative of the surrounding tissues with somewhat indistinct margins suggestive of inflammation. Salivary glands: No inflammation, mass, or stone. Thyroid : Above described soft tissue abnormality at the level of the thoracic inlet is continuous with the lower left thyroid  lobe. This causes marked rightward deviation of the trachea, which remains patent. Lymph nodes: Previously visualized enlarged lymph nodes of the right cervical chain have resolved. Unchanged left level 3 node measuring 6 mm (2:67). No enlarged or abnormal density nodes. Vascular: Right IJ approach central venous catheter with tip below the field of view (Port-A-Cath), with access needle. Limited intracranial: Normal Visualized orbits: Normal Mastoids and visualized paranasal sinuses: Clear. Skeleton: No acute or aggressive process. Upper chest: Negative. Other: None. IMPRESSION: 1. Circumferential soft tissue abnormality surrounding the trachea and posteriorly displacing the esophagus at the level of the thoracic inlet,  worsened since 04/14/2023 and consistent with progression of glottic squamous cell carcinoma. 2. Area inflammatory change associated with the above mass is likely a post radiation effect. 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 3. Previously visualized enlarged lymph nodes of the right cervical chain have resolved. Electronically Signed   By: Juanetta Nordmann M.D.   On: 06/15/2023 23:01   CT Angio Chest PE W and/or  Wo Contrast Result Date: 06/15/2023 CLINICAL DATA:  Pulmonary embolus suspected with high probability. Shortness of breath, tachycardia, productive cough with hemoptysis. Cancer. Difficulty swallowing. Known throat cancer. EXAM: CT ANGIOGRAPHY CHEST WITH CONTRAST TECHNIQUE: Multidetector CT imaging of the chest was performed using the standard protocol during bolus administration of intravenous contrast. Multiplanar CT image reconstructions and MIPs were obtained to evaluate the vascular anatomy. RADIATION DOSE REDUCTION: This exam was performed according to the departmental dose-optimization program which includes automated exposure control, adjustment of the mA and/or kV according to patient size and/or use of iterative reconstruction technique. CONTRAST:  75mL OMNIPAQUE  IOHEXOL  350 MG/ML SOLN COMPARISON:  Chest radiograph 06/15/2023. PET-CT 04/14/2023. CT chest 05/05/2022 FINDINGS: Cardiovascular: Technically adequate study with good opacification of the central and segmental pulmonary arteries. Mild motion artifact. No focal filling defects. No evidence of significant pulmonary embolus. Normal heart size. No pericardial effusions. Normal caliber thoracic aorta. No aortic dissection. Great vessel origins are patent. Mediastinum/Nodes: Right central venous catheter with tip at the cavoatrial junction. Eccentric esophageal or paraesophageal mass at the thoracic inlet measuring about 3.6 cm diameter. This displaces the trachea towards the right. This is consistent with  known esophageal neoplasm. The lateral wall of the trachea is thickened and irregular, likely indicating direct invasion of the trachea. The distal esophagus is decompressed. Mildly enlarged mediastinal lymph nodes with pretracheal nodes measuring up to 10 mm short axis dimension. These are nonspecific. Lungs/Pleura: Patchy perihilar infiltrates could be due to motion artifact or may indicate mild edema or pneumonia. Bandlike opacities in the left lung base likely representing scarring. No pleural effusion or pneumothorax. Upper Abdomen: No acute abnormalities. Renal cysts, largest on the left measuring 5.7 cm diameter. No imaging follow-up is indicated. Musculoskeletal: Degenerative changes in the spine. No destructive bone lesions. Review of the MIP images confirms the above findings. IMPRESSION: 1. Esophageal mass of the level of the thoracic inlet, displacing the trachea. This likely corresponds to known cancer. The adjacent tracheal wall is irregular, likely representing direct tumor invasion. Nonspecific mediastinal lymph nodes could be metastatic. 2. No evidence of significant pulmonary embolus. 3. Patchy perihilar infiltrates may represent motion artifact, edema, or pneumonia. Electronically Signed   By: Boyce Byes M.D.   On: 06/15/2023 21:43   DG Chest 2 View Result Date: 06/15/2023 CLINICAL DATA:  Neck pain difficulty swallowing EXAM: CHEST - 2 VIEW COMPARISON:  08/16/2022 FINDINGS: Right-sided central venous port tip at the SVC. No acute airspace disease or effusion. Normal cardiac size. Rightward tracheal deviation with left paratracheal opacity. IMPRESSION: 1. No active cardiopulmonary disease. 2. Rightward tracheal deviation with left paratracheal opacity,, presumably corresponding to mass seen on prior PET CT. Electronically Signed   By: Esmeralda Hedge M.D.   On: 06/15/2023 20:12   DG Neck Soft Tissue Result Date: 06/15/2023 CLINICAL DATA:  Neck pain difficulty swallowing EXAM: NECK SOFT  TISSUES - 1+ VIEW COMPARISON:  Esophagram 06/09/2023, swallow studies 05/25/2023, 04/14/2023 FINDINGS: Normal prevertebral soft tissue thickness. No retropharyngeal thickening or gas. Slightly thickened appearance of the epiglottis and slightly hazy appearance of the glottic area. There may be slight narrowing of the subglottic trachea. Suspect similar appearance on recent swallowing exams. IMPRESSION: Slightly thickened appearance of the epiglottis and slightly hazy appearance of the glottic area suggesting mild edema or inflammation. Consider correlation with direct visualization. Possible mild narrowing of the subglottic trachea as well. Electronically Signed   By: Esmeralda Hedge M.D.   On: 06/15/2023 20:10    A/P: Jesus [name]  is an 69 y.o. 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 Tellis Feathers to place surgical gastrostomy tube  Given the degree of dysphagia including the liquids, was not felt to be a good candidate for PEG tube or IR guided placement.  The patient indicates plans sometime in the month of March with Dr. Donalee Fruits to undergo a attempt at what sounds like removal of portion of intraluminal esophageal tumor and esophageal dilation if feasible.  Spent time discussing things with him at bedside today.  We did discuss what surgery would involve.  We discussed timing of surgery likely being early this coming week based on OR availability and other procedures.  We discussed gastrostomy tube placement and general expectations therein.  He is amenable to proceeding with this for sustenance of nutrition.  We discussed that the surgery would be carried out at the discretion of my partner whom is on-call for our group here at [facility] next week, Dr. Marny Sires.  We discussed he would meet and discuss with him prior to surgery as well.  -NPO 11:59pm Sunday 2/9 for  possible surgery Monday. Otherwise, diet as per ENT  I spent a total of 60 minutes in both face-to-face and non-face-to-face activities, excluding procedures performed, for this visit on the date of this encounter.  Beatris Lincoln, MD [facility], [facility]

## 2023-06-17 NOTE — Plan of Care (Signed)
   Problem: Coping: Goal: Level of anxiety will decrease Outcome: Progressing   Problem: Pain Managment: Goal: General experience of comfort will improve and/or be controlled Outcome: Progressing   Problem: Safety: Goal: Ability to remain free from injury will improve Outcome: Progressing

## 2023-06-18 DIAGNOSIS — R1313 Dysphagia, pharyngeal phase: Secondary | ICD-10-CM | POA: Diagnosis not present

## 2023-06-18 LAB — CBC
HCT: 23.2 % — ABNORMAL LOW (ref 39.0–52.0)
Hemoglobin: 6.9 g/dL — CL (ref 13.0–17.0)
MCH: 25.5 pg — ABNORMAL LOW (ref 26.0–34.0)
MCHC: 29.7 g/dL — ABNORMAL LOW (ref 30.0–36.0)
MCV: 85.6 fL (ref 80.0–100.0)
Platelets: 478 10*3/uL — ABNORMAL HIGH (ref 150–400)
RBC: 2.71 MIL/uL — ABNORMAL LOW (ref 4.22–5.81)
RDW: 13.8 % (ref 11.5–15.5)
WBC: 6.2 10*3/uL (ref 4.0–10.5)
nRBC: 0 % (ref 0.0–0.2)

## 2023-06-18 LAB — BASIC METABOLIC PANEL
Anion gap: 10 (ref 5–15)
BUN: 11 mg/dL (ref 8–23)
CO2: 23 mmol/L (ref 22–32)
Calcium: 8.8 mg/dL — ABNORMAL LOW (ref 8.9–10.3)
Chloride: 106 mmol/L (ref 98–111)
Creatinine, Ser: 1.03 mg/dL (ref 0.61–1.24)
GFR, Estimated: >60 mL/min (ref >60)
Glucose, Bld: 100 mg/dL — ABNORMAL HIGH (ref 70–99)
Potassium: 4.1 mmol/L (ref 3.5–5.1)
Sodium: 139 mmol/L (ref 135–145)

## 2023-06-18 LAB — SURGICAL PCR SCREEN
MRSA, PCR: NEGATIVE
Staphylococcus aureus: NEGATIVE

## 2023-06-18 LAB — PREPARE RBC (CROSSMATCH)

## 2023-06-18 MED ORDER — POTASSIUM CHLORIDE IN NACL 20-0.9 MEQ/L-% IV SOLN
INTRAVENOUS | Status: AC
  Filled 2023-06-18 (×3): qty 1000

## 2023-06-18 MED ORDER — SODIUM CHLORIDE 0.9 % IV SOLN
3.0000 g | Freq: Four times a day (QID) | INTRAVENOUS | Status: DC
  Administered 2023-06-18 – 2023-06-19 (×4): 3 g via INTRAVENOUS
  Filled 2023-06-18 (×6): qty 8

## 2023-06-18 MED ORDER — SODIUM CHLORIDE 0.9% IV SOLUTION: Freq: Once | INTRAVENOUS | Status: DC

## 2023-06-18 NOTE — Plan of Care (Signed)
   Problem: Coping: Goal: Level of anxiety will decrease Outcome: Progressing   Problem: Pain Managment: Goal: General experience of comfort will improve and/or be controlled Outcome: Progressing   Problem: Safety: Goal: Ability to remain free from injury will improve Outcome: Progressing

## 2023-06-18 NOTE — Progress Notes (Signed)
 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  Patient on immunotherapy Keytruda .  He has a history of HLD, HTN, resolved as he has had significant weight loss.  He has had progressive dysphagia, difficulty swallowing solids.  He follows with ENT Dr. Donalee Fruits. He presented with inability to swallow solids or liquids. Admitted for the same.  Per ENT recommended G-tube and outpatient surgical resection/dilation of the esophageal mass.   Assessment & Plan:   Dysphagia w/ esophageal deviation: - Main reason for admission.  Still with difficulty eating or drinking anything.   - Spoke with Dr. Tellis Feathers on-call ENT, who had spoken with Dr. Donalee Fruits, patient's ENT - Difficult surgery.  Plan will be for outpt surgery per ENT - recommended PEG/feeding tube for strengthening/nutrition beforehand.  Would likely need to be surgical tube.  -Appreciate surgery input.  Recommending surgical G-tube placement.  Most likely would happen Monday 2/10.  Also likelihood this will be permanent.  Temporizing measure to help with nutrition until he can have ENT surgery.  Pneumonia //  Immunocompromised patient - no fevers/chills/cough.  Unclear whether actually PNA or not, CT concerning for the same. Regardless, treating as such due to immunocompromise status.  CAP -Pneumonia order set initiated -Blood cultures x 2 ordered -IV cefepime  and vancomycin  with negative blood cultures, narrow to unasyn     AKI (acute kidney injury) (HCC) //  Dehydration -IV fluid hydration - resolved     Excessive secretion -Robinul  ordered.  Has been helpful.  He is able to maintain his secretions in house    Head and neck cancer w/ mets  Tracheal deviation d/t mass -Per oncologist -Tracheal deviation known -Has had  some ongoing stridor but no other difficulty breathing.  Ensure this does not worsen.  Normocytic anemia: - longstanding, likely chronic disease -Hemoglobin 6.9 today.  Likely multifactorial with cancer plus poor nutritional status. -1 unit PRBC today and repeat CBC tomorrow.   DVT prophylaxis:  SCDs Start: 06/16/23 0122  Code Status:   Code Status: Full Code Level of care: Med-Surg Status is: Inpatient Remains inpatient appropriate because: inability to tolerate PO, possible PEG placement.    Consults called: ENT, surgery   Subjective: Patient awake and alert.  Feels well today.  Swallowing has decreased again and only able to swallow few liquids.  No difficulty breathing.  Ready for surgery tomorrow.  Objective: Vitals:   06/18/23 1052 06/18/23 1128 06/18/23 1321 06/18/23 1407  BP: 128/86 139/82 (!) 144/101 (!) 142/86  Pulse: 98 95 99 97  Resp: 16 20 16 18   Temp: 98.2 F (36.8 C) 98.1 F (36.7 C) 98.1 F (36.7 C) 98.2 F (36.8 C)  TempSrc: Oral Oral Oral Oral  SpO2: 100%  100% 100%  Weight:      Height:        Intake/Output Summary (Last 24 hours) at 06/18/2023 1518 Last data filed at 06/18/2023 1407 Gross per 24 hour  Intake 2787.43 ml  Output --  Net 2787.43 ml   Filed Weights   06/16/23 0810  Weight: 108.4 kg   Body mass index is 32.41 kg/m.  Gen: 68 y.o. male in no apparent distress.  Nontoxic Neck: Fullness noted particularly on the right side.  No stridor.  No drooling. Pulm: Non-labored breathing.  Clear to auscultation bilaterally.  CV: Regular  rate and rhythm. No murmur, rub, or gallop. No JVD GI: Abdomen soft, non-tender, non-distended, with normoactive bowel sounds. No organomegaly or masses felt. Ext: Warm, no deformities, no pedal edema Skin: No rashes, lesions no ulcers Neuro: Alert and oriented. No focal neurological deficits. Psych: Calm  Judgement and insight appear normal. Mood & affect appropriate.     I have personally reviewed the  following labs and images: CBC: Recent Labs  Lab 06/13/23 1452 06/15/23 1916 06/16/23 0422 06/17/23 0420 06/18/23 0338  WBC 8.1 9.5 7.4 6.2 6.2  NEUTROABS 5.9  --  5.0  --   --   HGB 8.1* 8.2* 7.2* 7.0* 6.9*  HCT 26.8* 27.8* 23.8* 23.6* 23.2*  MCV 83.5 83.7 85.6 85.5 85.6  PLT 524* 540* 469* 489* 478*   BMP &GFR Recent Labs  Lab 06/13/23 1452 06/15/23 1916 06/16/23 0422 06/17/23 0420 06/18/23 0338  NA 136 136 135 137 139  K 4.0 3.9 3.8 3.8 4.1  CL 98 98 101 105 106  CO2 32 27 24 23 23   GLUCOSE 149* 136* 119* 108* 100*  BUN 18 18 16 14 11   CREATININE 1.20 1.26* 0.98 1.00 1.03  CALCIUM  9.6 9.5 8.6* 8.7* 8.8*   Estimated Creatinine Clearance: 88.5 mL/min (by C-G formula based on SCr of 1.03 mg/dL). Liver & Pancreas: Recent Labs  Lab 06/13/23 1452 06/15/23 1916  AST 10* 12*  ALT 8 12  ALKPHOS 82 76  BILITOT 0.4 0.7  PROT 8.5* 8.7*  ALBUMIN 3.8 3.5   No results for input(s): "LIPASE", "AMYLASE" in the last 168 hours. No results for input(s): "AMMONIA" in the last 168 hours. Diabetic: No results for input(s): "HGBA1C" in the last 72 hours. No results for input(s): "GLUCAP" in the last 168 hours. Cardiac Enzymes: No results for input(s): "CKTOTAL", "CKMB", "CKMBINDEX", "TROPONINI" in the last 168 hours. No results for input(s): "PROBNP" in the last 8760 hours. Coagulation Profile: No results for input(s): "INR", "PROTIME" in the last 168 hours. Thyroid  Function Tests: Recent Labs    06/16/23 0052  TSH 4.054   Lipid Profile: No results for input(s): "CHOL", "HDL", "LDLCALC", "TRIG", "CHOLHDL", "LDLDIRECT" in the last 72 hours. Anemia Panel: No results for input(s): "VITAMINB12", "FOLATE", "FERRITIN", "TIBC", "IRON", "RETICCTPCT" in the last 72 hours. Urine analysis:    Component Value Date/Time   COLORURINE YELLOW 08/19/2022 1248   APPEARANCEUR CLEAR 08/19/2022 1248   LABSPEC 1.021 08/19/2022 1248   PHURINE 5.0 08/19/2022 1248   GLUCOSEU 50 (A)  08/19/2022 1248   HGBUR NEGATIVE 08/19/2022 1248   BILIRUBINUR NEGATIVE 08/19/2022 1248   BILIRUBINUR negative 12/22/2017 1545   KETONESUR NEGATIVE 08/19/2022 1248   PROTEINUR 30 (A) 08/19/2022 1248   UROBILINOGEN 1.0 12/22/2017 1545   NITRITE NEGATIVE 08/19/2022 1248   LEUKOCYTESUR NEGATIVE 08/19/2022 1248   Sepsis Labs: Invalid input(s): "PROCALCITONIN", "LACTICIDVEN"  Microbiology: Recent Results (from the past 240 hours)  Resp panel by RT-PCR (RSV, Flu A&B, Covid) Anterior Nasal Swab     Status: None   Collection Time: 06/15/23  7:16 PM   Specimen: Anterior Nasal Swab  Result Value Ref Range Status   SARS Coronavirus 2 by RT PCR NEGATIVE NEGATIVE Final    Comment: (NOTE) SARS-CoV-2 target nucleic acids are NOT DETECTED.  The SARS-CoV-2 RNA is generally detectable in upper respiratory specimens during the acute phase of infection. The lowest concentration of SARS-CoV-2 viral copies this assay can detect is 138 copies/mL. A negative result does not preclude SARS-Cov-2 infection and should not be  used as the sole basis for treatment or other patient management decisions. A negative result may occur with  improper specimen collection/handling, submission of specimen other than nasopharyngeal swab, presence of viral mutation(s) within the areas targeted by this assay, and inadequate number of viral copies(<138 copies/mL). A negative result must be combined with clinical observations, patient history, and epidemiological information. The expected result is Negative.  Fact Sheet for Patients:  BloggerCourse.com  Fact Sheet for Healthcare Providers:  SeriousBroker.it  This test is no t yet approved or cleared by the [place]  FDA and  has been authorized for detection and/or diagnosis of SARS-CoV-2 by FDA under an Emergency Use Authorization (EUA). This EUA will remain  in effect (meaning this test can be used) for the  duration of the COVID-19 declaration under Section 564(b)(1) of the Act, 21 U.S.C.section 360bbb-3(b)(1), unless the authorization is terminated  or revoked sooner.       Influenza A by PCR NEGATIVE NEGATIVE Final   Influenza B by PCR NEGATIVE NEGATIVE Final    Comment: (NOTE) The Xpert Xpress SARS-CoV-2/FLU/RSV plus assay is intended as an aid in the diagnosis of influenza from Nasopharyngeal swab specimens and should not be used as a sole basis for treatment. Nasal washings and aspirates are unacceptable for Xpert Xpress SARS-CoV-2/FLU/RSV testing.  Fact Sheet for Patients: BloggerCourse.com  Fact Sheet for Healthcare Providers: SeriousBroker.it  This test is not yet approved or cleared by the [place]  FDA and has been authorized for detection and/or diagnosis of SARS-CoV-2 by FDA under an Emergency Use Authorization (EUA). This EUA will remain in effect (meaning this test can be used) for the duration of the COVID-19 declaration under Section 564(b)(1) of the Act, 21 U.S.C. section 360bbb-3(b)(1), unless the authorization is terminated or revoked.     Resp Syncytial Virus by PCR NEGATIVE NEGATIVE Final    Comment: (NOTE) Fact Sheet for Patients: BloggerCourse.com  Fact Sheet for Healthcare Providers: SeriousBroker.it  This test is not yet approved or cleared by the [place]  FDA and has been authorized for detection and/or diagnosis of SARS-CoV-2 by FDA under an Emergency Use Authorization (EUA). This EUA will remain in effect (meaning this test can be used) for the duration of the COVID-19 declaration under Section 564(b)(1) of the Act, 21 U.S.C. section 360bbb-3(b)(1), unless the authorization is terminated or revoked.  Performed at [facility], [address] [place], [place] [address]   MRSA Next Gen by PCR, Nasal     Status: None    Collection Time: 06/16/23 10:03 AM   Specimen: Nasal Mucosa; Nasal Swab  Result Value Ref Range Status   MRSA by PCR Next Gen NOT DETECTED NOT DETECTED Final    Comment: (NOTE) The GeneXpert MRSA Assay (FDA approved for NASAL specimens only), is one component of a comprehensive MRSA colonization surveillance program. It is not intended to diagnose MRSA infection nor to guide or monitor treatment for MRSA infections. Test performance is not FDA approved in patients less than 19 years old. Performed at [facility], [address] [place], [place] [address]     Radiology Studies: No results found.   Scheduled Meds:  sodium chloride    Intravenous Once   Chlorhexidine  Gluconate Cloth  6 each Topical Daily   sodium chloride  flush  10-40 mL Intracatheter Q12H   Continuous Infusions:  ceFEPime  (MAXIPIME ) IV 2 g (06/18/23 0900)   vancomycin  1,000 mg (06/18/23 1440)     LOS: 2 days   35 minutes  with more than 50% spent in reviewing records, counseling patient/family and coordinating care.  Trenton Frock, MD [facility] [email or web address] 06/18/2023, 3:18 PM

## 2023-06-18 NOTE — Progress Notes (Signed)
 Subjective No acute events. Up in chair, dressed in his home clothes, watching TV. Denies any complaints. Indicates he is ready for g-tube placement in coming day(s).  Objective: Vital signs in last 24 hours: Temp:  [98 F (36.7 C)-98.5 F (36.9 C)] 98.5 F (36.9 C) (02/09 0527) Pulse Rate:  [88-100] 100 (02/09 0527) Resp:  [16-17] 16 (02/09 0527) BP: (125-148)/(78-86) 125/78 (02/09 0527) SpO2:  [99 %-100 %] 99 % (02/09 0527) Last BM Date : 06/12/23  Intake/Output from previous day: 02/08 0701 - 02/09 0700 In: 3612.5 [P.O.:960; I.V.:1952.5; IV Piggyback:700] Out: -  Intake/Output this shift: No intake/output data recorded.  Gen: NAD, comfortable CV: RRR Pulm: Normal work of breathing Abd: Soft, NT/ND Ext: SCDs in place  Lab Results: CBC  Recent Labs    06/17/23 0420 06/18/23 0338  WBC 6.2 6.2  HGB 7.0* 6.9*  HCT 23.6* 23.2*  PLT 489* 478*   BMET Recent Labs    06/17/23 0420 06/18/23 0338  NA 137 139  K 3.8 4.1  CL 105 106  CO2 23 23  GLUCOSE 108* 100*  BUN 14 11  CREATININE 1.00 1.03  CALCIUM  8.7* 8.8*   PT/INR No results for input(s): "LABPROT", "INR" in the last 72 hours. ABG No results for input(s): "PHART", "HCO3" in the last 72 hours.  Invalid input(s): "PCO2", "PO2"  Studies/Results:  Anti-infectives: Anti-infectives (From admission, onward)    Start     Dose/Rate Route Frequency Ordered Stop   06/17/23 0000  vancomycin  (VANCOREADY) IVPB 1500 mg/300 mL  Status:  Discontinued        1,500 mg 150 mL/hr over 120 Minutes Intravenous Every 24 hours 06/16/23 0137 06/16/23 0923   06/16/23 1100  vancomycin  (VANCOCIN ) IVPB 1000 mg/200 mL premix        1,000 mg 200 mL/hr over 60 Minutes Intravenous Every 12 hours 06/16/23 0923     06/15/23 2315  vancomycin  (VANCOREADY) IVPB 2000 mg/400 mL        2,000 mg 200 mL/hr over 120 Minutes Intravenous  Once 06/15/23 2303 06/16/23 0227   06/15/23 2315  ceFEPIme  (MAXIPIME ) 2 g in sodium chloride  0.9 % 100  mL IVPB        2 g 200 mL/hr over 30 Minutes Intravenous Every 8 hours 06/15/23 2303          Assessment/Plan: Patient Active Problem List   Diagnosis Date Noted   Dysphagia 06/16/2023   CAP (community acquired pneumonia) 06/15/2023   Immunocompromised patient (HCC) 06/15/2023   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   Pain due to onychomycosis of toenail 10/25/2022   Port-A-Cath in place 09/05/2022   Head and neck cancer (HCC) 08/04/2022   Chronic pain 07/15/2021   Diabetes mellitus without complication (HCC) 07/15/2021   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   Gastroesophageal reflux disease without esophagitis 07/15/2021   History of lymphoma 07/15/2021   Lipoma of skin and subcutaneous tissue of trunk 07/15/2021   Mixed hyperlipidemia 07/15/2021   Prediabetes 07/15/2021   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   Tobacco dependence 07/15/2021   Trigger thumb of left hand 02/17/2021   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   Hoarseness 08/11/2020   Chronic pain of right knee 10/17/2018   Large cell (diffuse) non-Hodgkin's lymphoma (HCC) 05/10/2018   Bacteremia due to Escherichia coli    HTN (hypertension) 04/17/2018   RLS (restless legs syndrome) 04/17/2018  Abnormal LFTs 04/17/2018   Acute metabolic encephalopathy 04/16/2018   AKI (acute kidney injury) (HCC) 04/16/2018   Dehydration    Fever, unspecified    Sepsis (HCC)    Disorientation    Non-Hodgkin's lymphoma of skin (HCC) 04/03/2018   Hypokalemia    Hypomagnesemia    Anemia    Encounter for antineoplastic chemotherapy    At high risk of tumor lysis syndrome    Swelling of lower leg    Diffuse large B cell lymphoma (HCC) 01/15/2018   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HCC) 01/12/2018   Counseling regarding advance care planning and goals of care 01/12/2018    Bilateral leg pain 05/27/2014   Jesus [name] is an 68 y.o. male with metastatic squamous cell carcinoma of the larynx now causing significant dysphagia to both solids and liquids   We have been asked by internal medicine by way of Dr. Tellis Feathers to place surgical gastrostomy tube   Given the degree of dysphagia including the liquids 2/2 mass, was not felt to be a good candidate for PEG tube or IR guided placement.  The patient indicates plans sometime in the month of March with Dr. Donalee Fruits to undergo a attempt at what sounds like removal of portion of intraluminal esophageal tumor and esophageal dilation if feasible.   Spent time discussing things with him at bedside again today.  We did discuss what surgery would involve.  We discussed timing of surgery likely being early this coming week based on OR availability and other procedures.  We discussed gastrostomy tube placement and general expectations therein and likelihood this will be permanent.  He is amenable to proceeding with this for sustenance of nutrition.   We discussed that the surgery would be carried out at the discretion of my partner whom is on-call for our group here at [facility] next week, Dr. Marny Sires.  We discussed he would meet and discuss with him prior to surgery as well.   LOS: 2 days   I spent a total of 35 minutes in both face-to-face and non-face-to-face activities, excluding procedures performed, for this visit on the date of this encounter.  Beatris Lincoln, MD [facility]

## 2023-06-18 NOTE — Progress Notes (Signed)
 Date and time results received: 06/18/23 at 0422  Test: Hemoglobin Critical Value: 6.9  Name of Provider Notified: Bailey Lesser, NP   Orders Received? Or Actions Taken?: new orders placed to transfuse 1 unit of PRBC's

## 2023-06-19 ENCOUNTER — Encounter (HOSPITAL_COMMUNITY)
Admission: EM | Disposition: A | Discharge status: Home / Self Care | Payer: Self-pay | Source: Home / Self Care | Attending: Internal Medicine

## 2023-06-19 ENCOUNTER — Inpatient Hospital Stay (HOSPITAL_COMMUNITY): Payer: PPO | Admitting: Certified Registered Nurse Anesthetist

## 2023-06-19 ENCOUNTER — Other Ambulatory Visit: Payer: Self-pay

## 2023-06-19 ENCOUNTER — Encounter (HOSPITAL_COMMUNITY): Payer: Self-pay | Admitting: Family Medicine

## 2023-06-19 ENCOUNTER — Encounter: Payer: Self-pay | Admitting: Hematology

## 2023-06-19 DIAGNOSIS — Z87891 Personal history of nicotine dependence: Secondary | ICD-10-CM

## 2023-06-19 DIAGNOSIS — I10 Essential (primary) hypertension: Secondary | ICD-10-CM | POA: Diagnosis not present

## 2023-06-19 DIAGNOSIS — C159 Malignant neoplasm of esophagus, unspecified: Secondary | ICD-10-CM | POA: Diagnosis not present

## 2023-06-19 DIAGNOSIS — J449 Chronic obstructive pulmonary disease, unspecified: Secondary | ICD-10-CM | POA: Diagnosis not present

## 2023-06-19 DIAGNOSIS — R1310 Dysphagia, unspecified: Secondary | ICD-10-CM | POA: Diagnosis not present

## 2023-06-19 HISTORY — PX: LAPAROSCOPIC INSERTION GASTROSTOMY TUBE: SHX6817

## 2023-06-19 LAB — TYPE AND SCREEN
ABO/RH(D): A POS
Antibody Screen: NEGATIVE
Unit division: 0

## 2023-06-19 LAB — CBC
HCT: 27.6 % — ABNORMAL LOW (ref 39.0–52.0)
Hemoglobin: 8.4 g/dL — ABNORMAL LOW (ref 13.0–17.0)
MCH: 25.8 pg — ABNORMAL LOW (ref 26.0–34.0)
MCHC: 30.4 g/dL (ref 30.0–36.0)
MCV: 84.7 fL (ref 80.0–100.0)
Platelets: 494 10*3/uL — ABNORMAL HIGH (ref 150–400)
RBC: 3.26 MIL/uL — ABNORMAL LOW (ref 4.22–5.81)
RDW: 13.5 % (ref 11.5–15.5)
WBC: 7.2 10*3/uL (ref 4.0–10.5)
nRBC: 0 % (ref 0.0–0.2)

## 2023-06-19 LAB — BASIC METABOLIC PANEL
Anion gap: 10 (ref 5–15)
BUN: 10 mg/dL (ref 8–23)
CO2: 24 mmol/L (ref 22–32)
Calcium: 9 mg/dL (ref 8.9–10.3)
Chloride: 104 mmol/L (ref 98–111)
Creatinine, Ser: 0.89 mg/dL (ref 0.61–1.24)
GFR, Estimated: >60 mL/min (ref >60)
Glucose, Bld: 83 mg/dL (ref 70–99)
Potassium: 3.9 mmol/L (ref 3.5–5.1)
Sodium: 138 mmol/L (ref 135–145)

## 2023-06-19 LAB — BPAM RBC
Blood Product Expiration Date: 202503042359
ISSUE DATE / TIME: 202502091108
Unit Type and Rh: 6200

## 2023-06-19 SURGERY — INSERTION, GASTROSTOMY TUBE, PERCUTANEOUS
Anesthesia: General | Site: Abdomen

## 2023-06-19 MED ORDER — SUGAMMADEX SODIUM 200 MG/2ML IV SOLN
INTRAVENOUS | Status: DC | PRN
  Administered 2023-06-19: 200 mg via INTRAVENOUS

## 2023-06-19 MED ORDER — HYDROMORPHONE HCL 1 MG/ML IJ SOLN: 0.5000 mg | INTRAMUSCULAR | Status: DC | PRN

## 2023-06-19 MED ORDER — FENTANYL CITRATE PF 50 MCG/ML IJ SOSY
25.0000 ug | PREFILLED_SYRINGE | INTRAMUSCULAR | Status: DC | PRN
  Administered 2023-06-19 (×3): 50 ug via INTRAVENOUS

## 2023-06-19 MED ORDER — ROCURONIUM BROMIDE 10 MG/ML (PF) SYRINGE
PREFILLED_SYRINGE | INTRAVENOUS | Status: DC | PRN
  Administered 2023-06-19: 10 mg via INTRAVENOUS

## 2023-06-19 MED ORDER — FENTANYL CITRATE PF 50 MCG/ML IJ SOSY
PREFILLED_SYRINGE | INTRAMUSCULAR | Status: AC
  Filled 2023-06-19: qty 1

## 2023-06-19 MED ORDER — SUCCINYLCHOLINE CHLORIDE 200 MG/10ML IV SOSY
PREFILLED_SYRINGE | INTRAVENOUS | Status: DC | PRN
  Administered 2023-06-19: 200 mg via INTRAVENOUS

## 2023-06-19 MED ORDER — BUPIVACAINE-EPINEPHRINE 0.25% -1:200000 IJ SOLN
INTRAMUSCULAR | Status: AC
  Filled 2023-06-19: qty 1

## 2023-06-19 MED ORDER — FENTANYL CITRATE (PF) 100 MCG/2ML IJ SOLN
INTRAMUSCULAR | Status: DC | PRN
  Administered 2023-06-19 (×2): 50 ug via INTRAVENOUS

## 2023-06-19 MED ORDER — PROPOFOL 10 MG/ML IV BOLUS
INTRAVENOUS | Status: AC
  Filled 2023-06-19: qty 20

## 2023-06-19 MED ORDER — ONDANSETRON HCL 4 MG/2ML IJ SOLN
INTRAMUSCULAR | Status: DC | PRN
  Administered 2023-06-19: 4 mg via INTRAVENOUS

## 2023-06-19 MED ORDER — 0.9 % SODIUM CHLORIDE (POUR BTL) OPTIME
TOPICAL | Status: DC | PRN
  Administered 2023-06-19: 1000 mL

## 2023-06-19 MED ORDER — LACTATED RINGERS IV SOLN: INTRAVENOUS | Status: DC

## 2023-06-19 MED ORDER — FENTANYL CITRATE PF 50 MCG/ML IJ SOSY
PREFILLED_SYRINGE | INTRAMUSCULAR | Status: AC
  Filled 2023-06-19: qty 2

## 2023-06-19 MED ORDER — ACETAMINOPHEN 160 MG/5ML PO SOLN
650.0000 mg | Freq: Four times a day (QID) | ORAL | Status: DC | PRN
  Administered 2023-06-22 – 2023-06-23 (×2): 650 mg
  Filled 2023-06-19 (×2): qty 20.3

## 2023-06-19 MED ORDER — DEXAMETHASONE SODIUM PHOSPHATE 10 MG/ML IJ SOLN
INTRAMUSCULAR | Status: DC | PRN
  Administered 2023-06-19: 10 mg via INTRAVENOUS

## 2023-06-19 MED ORDER — MIDAZOLAM HCL 2 MG/2ML IJ SOLN
INTRAMUSCULAR | Status: AC
  Filled 2023-06-19: qty 2

## 2023-06-19 MED ORDER — PROPOFOL 10 MG/ML IV BOLUS
INTRAVENOUS | Status: DC | PRN
  Administered 2023-06-19: 150 mg via INTRAVENOUS

## 2023-06-19 MED ORDER — MIDAZOLAM HCL 5 MG/5ML IJ SOLN
INTRAMUSCULAR | Status: DC | PRN
  Administered 2023-06-19: 2 mg via INTRAVENOUS

## 2023-06-19 MED ORDER — LIDOCAINE 2% (20 MG/ML) 5 ML SYRINGE
INTRAMUSCULAR | Status: DC | PRN
  Administered 2023-06-19: 60 mg via INTRAVENOUS

## 2023-06-19 MED ORDER — OXYCODONE HCL 5 MG/5ML PO SOLN
5.0000 mg | ORAL | Status: DC | PRN
  Administered 2023-06-19 – 2023-06-21 (×4): 10 mg
  Filled 2023-06-19 (×5): qty 10

## 2023-06-19 MED ORDER — STERILE WATER FOR IRRIGATION IR SOLN
Status: DC | PRN
  Administered 2023-06-19: 1000 mL

## 2023-06-19 MED ORDER — CHLORHEXIDINE GLUCONATE 0.12 % MT SOLN
15.0000 mL | Freq: Once | OROMUCOSAL | Status: AC
  Administered 2023-06-19: 15 mL via OROMUCOSAL

## 2023-06-19 MED ORDER — FENTANYL CITRATE (PF) 100 MCG/2ML IJ SOLN
INTRAMUSCULAR | Status: AC
  Filled 2023-06-19: qty 2

## 2023-06-19 SURGICAL SUPPLY — 43 items
BAG COUNTER SPONGE SURGICOUNT (BAG) IMPLANT
BENZOIN TINCTURE PRP APPL 2/3 (GAUZE/BANDAGES/DRESSINGS) IMPLANT
BNDG ADH 1X3 SHEER STRL LF (GAUZE/BANDAGES/DRESSINGS) ×2 IMPLANT
CABLE HIGH FREQUENCY MONO STRZ (ELECTRODE) IMPLANT
CHLORAPREP W/TINT 26 (MISCELLANEOUS) ×1 IMPLANT
COVER SURGICAL LIGHT HANDLE (MISCELLANEOUS) ×1 IMPLANT
DERMABOND ADVANCED .7 DNX12 (GAUZE/BANDAGES/DRESSINGS) IMPLANT
ELECT PENCIL ROCKER SW 15FT (MISCELLANEOUS) ×1 IMPLANT
G-TUBE MIC 18FR ENFIT ADLT (TUBING) IMPLANT
G-TUBE MIC BOLUS 22FR ENFIT (TUBING) IMPLANT
G-TUBE MIC BOLUS 24FR ENFIT (CATHETERS) IMPLANT
GLOVE BIOGEL PI IND STRL 7.0 (GLOVE) ×1 IMPLANT
GLOVE SURG SS PI 7.0 STRL IVOR (GLOVE) ×1 IMPLANT
GOWN STRL REUS W/ TWL LRG LVL3 (GOWN DISPOSABLE) ×1 IMPLANT
GOWN STRL REUS W/ TWL XL LVL3 (GOWN DISPOSABLE) IMPLANT
GRASPER SUT TROCAR 14GX15 (MISCELLANEOUS) ×1 IMPLANT
IRRIG SUCT STRYKERFLOW 2 WTIP (MISCELLANEOUS) IMPLANT
IRRIGATION SUCT STRKRFLW 2 WTP (MISCELLANEOUS) IMPLANT
KIT BASIN OR (CUSTOM PROCEDURE TRAY) ×1 IMPLANT
KIT TURNOVER KIT A (KITS) IMPLANT
LUBRICANT JELLY K Y 4OZ (MISCELLANEOUS) IMPLANT
SCISSORS LAP 5X35 DISP (ENDOMECHANICALS) IMPLANT
SET IRRIG Y TYPE TUR BLADDER L (SET/KITS/TRAYS/PACK) IMPLANT
SET TUBE SMOKE EVAC HIGH FLOW (TUBING) ×1 IMPLANT
SHEARS HARMONIC 36 ACE (MISCELLANEOUS) IMPLANT
SLEEVE Z-THREAD 5X100MM (TROCAR) ×1 IMPLANT
SPIKE FLUID TRANSFER (MISCELLANEOUS) ×1 IMPLANT
STRIP CLOSURE SKIN 1/2X4 (GAUZE/BANDAGES/DRESSINGS) IMPLANT
SUT ETHILON 2 0 PS N (SUTURE) IMPLANT
SUT MNCRL AB 4-0 PS2 18 (SUTURE) ×1 IMPLANT
SUT SILK 0 SH 30 (SUTURE) IMPLANT
SUT SILK 2 0 SH (SUTURE) ×3 IMPLANT
SUT SILK 3 0 SH 30 (SUTURE) ×5 IMPLANT
SUT STRTFX SPIRAL PDS+ 2-0 23 (SUTURE) ×1 IMPLANT
SUT VICRYL 0 UR6 27IN ABS (SUTURE) IMPLANT
SUTURE STRTFX SPRL PDS+ 2-0 23 (SUTURE) IMPLANT
TOWEL OR 17X26 10 PK STRL BLUE (TOWEL DISPOSABLE) ×1 IMPLANT
TRAY LAPAROSCOPIC (CUSTOM PROCEDURE TRAY) ×1 IMPLANT
TROCAR KII 8X100ML NONTHREADED (TROCAR) ×1 IMPLANT
TROCAR Z-THREAD OPTICAL 5X100M (TROCAR) ×1 IMPLANT
TUBE GASTRO 18FR ENFIT (TUBING) IMPLANT
TUBE GASTRO BOLUS 22FR ENFIT (TUBING) IMPLANT
TUBE GASTRO BOLUS 24FR ENFIT (CATHETERS) IMPLANT

## 2023-06-19 NOTE — Anesthesia Procedure Notes (Signed)
 Procedure Name: Intubation Date/Time: 06/19/2023 9:11 AM  Performed by: Bernhard Brine, CRNAPre-anesthesia Checklist: Patient identified, Patient being monitored, Timeout performed, Emergency Drugs available and Suction available Patient Re-evaluated:Patient Re-evaluated prior to induction Oxygen Delivery Method: Circle system utilized Preoxygenation: Pre-oxygenation with 100% oxygen Induction Type: IV induction Ventilation: Mask ventilation without difficulty Laryngoscope Size: Mac, 3 and Glidescope Grade View: Grade I Tube type: Oral Tube size: 7.0 mm Number of attempts: 1 Airway Equipment and Method: Stylet Placement Confirmation: ETT inserted through vocal cords under direct vision, positive ETCO2 and breath sounds checked- equal and bilateral Secured at: 23 cm Tube secured with: Tape Dental Injury: Teeth and Oropharynx as per pre-operative assessment  Future Recommendations: Recommend- induction with short-acting agent, and alternative techniques readily available

## 2023-06-19 NOTE — Progress Notes (Signed)
 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  Patient on immunotherapy Keytruda .  He has a history of HLD, HTN, resolved as he has had significant weight loss.  He has had progressive dysphagia, difficulty swallowing solids.  He follows with ENT Dr. Donalee Fruits. He presented with inability to swallow solids or liquids. Admitted for the same.  Per ENT recommended G-tube and outpatient surgical resection/dilation of the esophageal mass.   Assessment & Plan:   Dysphagia w/ esophageal deviation: - Main reason for admission.  Still with difficulty eating or drinking anything.   - Spoke with Dr. Tellis Feathers on-call ENT, who had spoken with Dr. Donalee Fruits, patient's ENT - Difficult surgery.  Plan will be for outpt surgery per ENT - recommended gastrostomy tube in the meantime - g tube placed today, ok to use per gen surg - rd consulted for tube feeds - TOC consulted to arrange home health tube feeds  Pulmonary infiltrate   - no fevers/chills/cough.  Ct with possible infiltrate - started on abx but as he is symptomatic and CT finding was vague (motion artifact, edema, pneumonia), will d/c abx and monitor   AKI (acute kidney injury) (HCC) //  Dehydration resolved    Excessive secretion -Robinul  ordered.  Has been helpful.  He is able to maintain his secretions in house - family questions whether home suctioning is possible, will need to discuss this with TOC    Head and neck cancer w/ mets  Tracheal deviation d/t mass -Per oncologist -Tracheal deviation known -Has had some ongoing stridor but no other difficulty breathing.    Normocytic anemia: - longstanding, likely chronic disease -Hemoglobin 6.9 yesterday.  Likely multifactorial with cancer plus poor nutritional status. No  bleeding. Received one unit of blood and hgb 8.4 today which is appropriate rise   DVT prophylaxis:  SCDs Start: 06/16/23 0122  Code Status:   Code Status: Full Code Level of care: Med-Surg Status is: Inpatient Remains inpatient appropriate because: need to initiate tube feeds and arrange for them at home   Consults called: ENT, surgery   Subjective: Tolerated surgery, feeling more or less his normal self  Objective: Vitals:   06/19/23 1100 06/19/23 1115 06/19/23 1130 06/19/23 1330  BP: (!) 167/87 (!) 152/89 (!) 146/87 133/84  Pulse: 85 84 81 86  Resp: 15 17 14 17   Temp:   (!) 97.5 F (36.4 C) 97.7 F (36.5 C)  TempSrc:      SpO2: 98% 99% 100% 100%  Weight:      Height:        Intake/Output Summary (Last 24 hours) at 06/19/2023 1535 Last data filed at 06/19/2023 1500 Gross per 24 hour  Intake 2967.63 ml  Output 10 ml  Net 2957.63 ml   Filed Weights   06/16/23 0810  Weight: 108.4 kg   Body mass index is 32.41 kg/m.  Gen: 68 y.o. male in no apparent distress.  Nontoxic Neck: Fullness noted particularly on the right side.  No stridor.  No drooling. Pulm: Non-labored breathing.  Some rhonchi CV: Regular rate and rhythm. No murmur, rub, or gallop. No JVD GI: Abdomen soft, non-tender, non-distended, with g tube left side dressing intact Ext: Warm, no deformities, no pedal edema Skin: No rashes, lesions no ulcers Neuro: Alert and oriented. No focal neurological deficits. Psych:  Calm  Judgement and insight appear normal. Mood & affect appropriate.     I have personally reviewed the following labs and images: CBC: Recent Labs  Lab 06/13/23 1452 06/15/23 1916 06/16/23 0422 06/17/23 0420 06/18/23 0338 06/19/23 0325  WBC 8.1 9.5 7.4 6.2 6.2 7.2  NEUTROABS 5.9  --  5.0  --   --   --   HGB 8.1* 8.2* 7.2* 7.0* 6.9* 8.4*  HCT 26.8* 27.8* 23.8* 23.6* 23.2* 27.6*  MCV 83.5 83.7 85.6 85.5 85.6 84.7  PLT 524* 540* 469* 489* 478* 494*   BMP &GFR Recent Labs  Lab  06/15/23 1916 06/16/23 0422 06/17/23 0420 06/18/23 0338 06/19/23 0325  NA 136 135 137 139 138  K 3.9 3.8 3.8 4.1 3.9  CL 98 101 105 106 104  CO2 27 24 23 23 24   GLUCOSE 136* 119* 108* 100* 83  BUN 18 16 14 11 10   CREATININE 1.26* 0.98 1.00 1.03 0.89  CALCIUM  9.5 8.6* 8.7* 8.8* 9.0   Estimated Creatinine Clearance: 102.4 mL/min (by C-G formula based on SCr of 0.89 mg/dL). Liver & Pancreas: Recent Labs  Lab 06/13/23 1452 06/15/23 1916  AST 10* 12*  ALT 8 12  ALKPHOS 82 76  BILITOT 0.4 0.7  PROT 8.5* 8.7*  ALBUMIN 3.8 3.5   No results for input(s): "LIPASE", "AMYLASE" in the last 168 hours. No results for input(s): "AMMONIA" in the last 168 hours. Diabetic: No results for input(s): "HGBA1C" in the last 72 hours. No results for input(s): "GLUCAP" in the last 168 hours. Cardiac Enzymes: No results for input(s): "CKTOTAL", "CKMB", "CKMBINDEX", "TROPONINI" in the last 168 hours. No results for input(s): "PROBNP" in the last 8760 hours. Coagulation Profile: No results for input(s): "INR", "PROTIME" in the last 168 hours. Thyroid  Function Tests: No results for input(s): "TSH", "T4TOTAL", "FREET4", "T3FREE", "THYROIDAB" in the last 72 hours.  Lipid Profile: No results for input(s): "CHOL", "HDL", "LDLCALC", "TRIG", "CHOLHDL", "LDLDIRECT" in the last 72 hours. Anemia Panel: No results for input(s): "VITAMINB12", "FOLATE", "FERRITIN", "TIBC", "IRON", "RETICCTPCT" in the last 72 hours. Urine analysis:    Component Value Date/Time   COLORURINE YELLOW 08/19/2022 1248   APPEARANCEUR CLEAR 08/19/2022 1248   LABSPEC 1.021 08/19/2022 1248   PHURINE 5.0 08/19/2022 1248   GLUCOSEU 50 (A) 08/19/2022 1248   HGBUR NEGATIVE 08/19/2022 1248   BILIRUBINUR NEGATIVE 08/19/2022 1248   BILIRUBINUR negative 12/22/2017 1545   KETONESUR NEGATIVE 08/19/2022 1248   PROTEINUR 30 (A) 08/19/2022 1248   UROBILINOGEN 1.0 12/22/2017 1545   NITRITE NEGATIVE 08/19/2022 1248   LEUKOCYTESUR NEGATIVE  08/19/2022 1248   Sepsis Labs: Invalid input(s): "PROCALCITONIN", "LACTICIDVEN"  Microbiology: Recent Results (from the past 240 hours)  Resp panel by RT-PCR (RSV, Flu A&B, Covid) Anterior Nasal Swab     Status: None   Collection Time: 06/15/23  7:16 PM   Specimen: Anterior Nasal Swab  Result Value Ref Range Status   SARS Coronavirus 2 by RT PCR NEGATIVE NEGATIVE Final    Comment: (NOTE) SARS-CoV-2 target nucleic acids are NOT DETECTED.  The SARS-CoV-2 RNA is generally detectable in upper respiratory specimens during the acute phase of infection. The lowest concentration of SARS-CoV-2 viral copies this assay can detect is 138 copies/mL. A negative result does not preclude SARS-Cov-2 infection and should not be used as the sole basis for treatment or other patient management decisions. A negative result may occur with  improper specimen collection/handling, submission of specimen other than nasopharyngeal swab, presence of viral mutation(s)  within the areas targeted by this assay, and inadequate number of viral copies(<138 copies/mL). A negative result must be combined with clinical observations, patient history, and epidemiological information. The expected result is Negative.  Fact Sheet for Patients:  BloggerCourse.com  Fact Sheet for Healthcare Providers:  SeriousBroker.it  This test is no t yet approved or cleared by the [place]  FDA and  has been authorized for detection and/or diagnosis of SARS-CoV-2 by FDA under an Emergency Use Authorization (EUA). This EUA will remain  in effect (meaning this test can be used) for the duration of the COVID-19 declaration under Section 564(b)(1) of the Act, 21 U.S.C.section 360bbb-3(b)(1), unless the authorization is terminated  or revoked sooner.       Influenza A by PCR NEGATIVE NEGATIVE Final   Influenza B by PCR NEGATIVE NEGATIVE Final    Comment: (NOTE) The Xpert Xpress  SARS-CoV-2/FLU/RSV plus assay is intended as an aid in the diagnosis of influenza from Nasopharyngeal swab specimens and should not be used as a sole basis for treatment. Nasal washings and aspirates are unacceptable for Xpert Xpress SARS-CoV-2/FLU/RSV testing.  Fact Sheet for Patients: BloggerCourse.com  Fact Sheet for Healthcare Providers: SeriousBroker.it  This test is not yet approved or cleared by the [place]  FDA and has been authorized for detection and/or diagnosis of SARS-CoV-2 by FDA under an Emergency Use Authorization (EUA). This EUA will remain in effect (meaning this test can be used) for the duration of the COVID-19 declaration under Section 564(b)(1) of the Act, 21 U.S.C. section 360bbb-3(b)(1), unless the authorization is terminated or revoked.     Resp Syncytial Virus by PCR NEGATIVE NEGATIVE Final    Comment: (NOTE) Fact Sheet for Patients: BloggerCourse.com  Fact Sheet for Healthcare Providers: SeriousBroker.it  This test is not yet approved or cleared by the [place]  FDA and has been authorized for detection and/or diagnosis of SARS-CoV-2 by FDA under an Emergency Use Authorization (EUA). This EUA will remain in effect (meaning this test can be used) for the duration of the COVID-19 declaration under Section 564(b)(1) of the Act, 21 U.S.C. section 360bbb-3(b)(1), unless the authorization is terminated or revoked.  Performed at [facility], [address] [place], [place] [address]   MRSA Next Gen by PCR, Nasal     Status: None   Collection Time: 06/16/23 10:03 AM   Specimen: Nasal Mucosa; Nasal Swab  Result Value Ref Range Status   MRSA by PCR Next Gen NOT DETECTED NOT DETECTED Final    Comment: (NOTE) The GeneXpert MRSA Assay (FDA approved for NASAL specimens only), is one component of a comprehensive MRSA colonization  surveillance program. It is not intended to diagnose MRSA infection nor to guide or monitor treatment for MRSA infections. Test performance is not FDA approved in patients less than 76 years old. Performed at [facility], [address], [place], [place] [address]   Surgical pcr screen     Status: None   Collection Time: 06/18/23  5:15 PM   Specimen: Nasal Mucosa; Nasal Swab  Result Value Ref Range Status   MRSA, PCR NEGATIVE NEGATIVE Final   Staphylococcus aureus NEGATIVE NEGATIVE Final    Comment: (NOTE) The Xpert SA Assay (FDA approved for NASAL specimens in patients 42 years of age and older), is one component of a comprehensive surveillance program. It is not intended to diagnose infection nor to guide or monitor treatment. Performed at [facility], [address] [place], [place] [address]  Radiology Studies: No results found.   Scheduled Meds:  sodium chloride    Intravenous Once   Chlorhexidine  Gluconate Cloth  6 each Topical Daily   fentaNYL        fentaNYL        sodium chloride  flush  10-40 mL Intracatheter Q12H   Continuous Infusions:  0.9 % NaCl with KCl 20 mEq / L 100 mL/hr at 06/19/23 1239   ampicillin -sulbactam (UNASYN ) IV 3 g (06/19/23 0310)     LOS: 3 days   35 minutes with more than 50% spent in reviewing records, counseling patient/family and coordinating care.  Raymonde Calico, MD [facility] [email or web address] 06/19/2023, 3:35 PM

## 2023-06-19 NOTE — Progress Notes (Signed)
 Subjective In preop  Objective: Vital signs in last 24 hours: Temp:  [98.1 F (36.7 C)-98.9 F (37.2 C)] 98.3 F (36.8 C) (02/10 0757) Pulse Rate:  [84-99] 84 (02/10 0757) Resp:  [16-20] 20 (02/10 0757) BP: (128-153)/(82-101) 153/96 (02/10 0757) SpO2:  [98 %-100 %] 98 % (02/10 0757) Last BM Date : 06/12/23  Intake/Output from previous day: 02/09 0701 - 02/10 0700 In: 1774.9 [P.O.:360; I.V.:766.9; Blood:348; IV Piggyback:300] Out: -  Intake/Output this shift: No intake/output data recorded.  Gen: NAD, comfortable CV: RRR Pulm: Normal work of breathing Abd: Soft, NT/ND Ext: SCDs in place  Lab Results: CBC  Recent Labs    06/18/23 0338 06/19/23 0325  WBC 6.2 7.2  HGB 6.9* 8.4*  HCT 23.2* 27.6*  PLT 478* 494*   BMET Recent Labs    06/18/23 0338 06/19/23 0325  NA 139 138  K 4.1 3.9  CL 106 104  CO2 23 24  GLUCOSE 100* 83  BUN 11 10  CREATININE 1.03 0.89  CALCIUM  8.8* 9.0   PT/INR No results for input(s): "LABPROT", "INR" in the last 72 hours. ABG No results for input(s): "PHART", "HCO3" in the last 72 hours.  Invalid input(s): "PCO2", "PO2"  Studies/Results:  Anti-infectives: Anti-infectives (From admission, onward)    Start     Dose/Rate Route Frequency Ordered Stop   06/18/23 1700  [MAR Hold]  Ampicillin -Sulbactam (UNASYN ) 3 g in sodium chloride  0.9 % 100 mL IVPB        (MAR Hold since Mon 06/19/2023 at 0747.Hold Reason: Transfer to a Procedural area)   3 g 200 mL/hr over 30 Minutes Intravenous Every 6 hours 06/18/23 1522     06/17/23 0000  vancomycin  (VANCOREADY) IVPB 1500 mg/300 mL  Status:  Discontinued        1,500 mg 150 mL/hr over 120 Minutes Intravenous Every 24 hours 06/16/23 0137 06/16/23 0923   06/16/23 1100  vancomycin  (VANCOCIN ) IVPB 1000 mg/200 mL premix  Status:  Discontinued        1,000 mg 200 mL/hr over 60 Minutes Intravenous Every 12 hours 06/16/23 0923 06/18/23 1522   06/15/23 2315  vancomycin  (VANCOREADY) IVPB 2000 mg/400 mL         2,000 mg 200 mL/hr over 120 Minutes Intravenous  Once 06/15/23 2303 06/16/23 0227   06/15/23 2315  ceFEPIme  (MAXIPIME ) 2 g in sodium chloride  0.9 % 100 mL IVPB  Status:  Discontinued        2 g 200 mL/hr over 30 Minutes Intravenous Every 8 hours 06/15/23 2303 06/18/23 1522        Assessment/Plan: Patient Active Problem List   Diagnosis Date Noted   Dysphagia 06/16/2023   CAP (community acquired pneumonia) 06/15/2023   Immunocompromised patient (HCC) 06/15/2023   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   Pain due to onychomycosis of toenail 10/25/2022   Port-A-Cath in place 09/05/2022   Head and neck cancer (HCC) 08/04/2022   Chronic pain 07/15/2021   Diabetes mellitus without complication (HCC) 07/15/2021   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   Gastroesophageal reflux disease without esophagitis 07/15/2021   History of lymphoma 07/15/2021   Lipoma of skin and subcutaneous tissue of trunk 07/15/2021   Mixed hyperlipidemia 07/15/2021   Prediabetes 07/15/2021   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   Tobacco dependence 07/15/2021   Trigger thumb of left hand 02/17/2021   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  Hoarseness 08/11/2020   Chronic pain of right knee 10/17/2018   Large cell (diffuse) non-Hodgkin's lymphoma (HCC) 05/10/2018   Bacteremia due to Escherichia coli    HTN (hypertension) 04/17/2018   RLS (restless legs syndrome) 04/17/2018   Abnormal LFTs 04/17/2018   Acute metabolic encephalopathy 04/16/2018   AKI (acute kidney injury) (HCC) 04/16/2018   Dehydration    Fever, unspecified    Sepsis (HCC)    Disorientation    Non-Hodgkin's lymphoma of skin (HCC) 04/03/2018   Hypokalemia    Hypomagnesemia    Anemia    Encounter for antineoplastic chemotherapy    At high risk of tumor lysis syndrome    Swelling of lower leg    Diffuse large B  cell lymphoma (HCC) 01/15/2018   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HCC) 01/12/2018   Counseling regarding advance care planning and goals of care 01/12/2018   Bilateral leg pain 05/27/2014   Jesus [name] is an 68 y.o. male with metastatic squamous cell carcinoma of the larynx now causing significant dysphagia to both solids and liquids   We have been asked by internal medicine by way of Dr. Tellis Feathers to place surgical gastrostomy tube   Given the degree of dysphagia including the liquids 2/2 mass, was not felt to be a good candidate for PEG tube or IR guided placement.  The patient indicates plans sometime in the month of March with Dr. Donalee Fruits to undergo a attempt at what sounds like removal of portion of intraluminal esophageal tumor and esophageal dilation if feasible.   Spent time discussing things with him at bedside again today.  We did discuss what surgery would involve.  We discussed timing of surgery likely being early this coming week based on OR availability and other procedures.  We discussed gastrostomy tube placement and general expectations therein and likelihood this will be permanent.  He is amenable to proceeding with this for sustenance of nutrition.   I discussed the procedure, its risks, benefits and alternatives with the patient.  We discussed long term G-tube care and difficulties.  After a full discussion the patient granted consent to proceed.  We will proceed with laparoscopic G tube placement today.    LOS: 3 days    Junie Olds, MD

## 2023-06-19 NOTE — Plan of Care (Signed)
 ?  Problem: Clinical Measurements: ?Goal: Ability to maintain clinical measurements within normal limits will improve ?Outcome: Progressing ?Goal: Will remain free from infection ?Outcome: Progressing ?Goal: Diagnostic test results will improve ?Outcome: Progressing ?  ?

## 2023-06-19 NOTE — Anesthesia Postprocedure Evaluation (Signed)
 Anesthesia Post Note  Patient: Jesus [name]  Procedure(s) Performed: LAPAROSCOPIC INSERTION GASTROSTOMY TUBE (Abdomen)     Patient location during evaluation: PACU Anesthesia Type: General Level of consciousness: awake and alert Pain management: pain level controlled Vital Signs Assessment: post-procedure vital signs reviewed and stable Respiratory status: spontaneous breathing, nonlabored ventilation, respiratory function stable and patient connected to nasal cannula oxygen Cardiovascular status: blood pressure returned to baseline and stable Postop Assessment: no apparent nausea or vomiting Anesthetic complications: no  No notable events documented.  Last Vitals:  Vitals:   06/19/23 1115 06/19/23 1130  BP: (!) 152/89 (!) 146/87  Pulse: 84 81  Resp: 17 14  Temp:  (!) 36.4 C  SpO2: 99% 100%    Last Pain:  Vitals:   06/19/23 1130  TempSrc:   PainSc: 5                  Aleeya Veitch L Rome Schlauch

## 2023-06-19 NOTE — Transfer of Care (Signed)
 Immediate Anesthesia Transfer of Care Note  Patient: Jesus [name]  Procedure(s) Performed: Procedure(s): LAPAROSCOPIC INSERTION GASTROSTOMY TUBE (N/A)  Patient Location: PACU  Anesthesia Type:General  Level of Consciousness: Alert, Awake, Oriented  Airway & Oxygen Therapy: Patient Spontanous Breathing  Post-op Assessment: Report given to RN  Post vital signs: Reviewed and stable  Last Vitals:  Vitals:   06/18/23 2218 06/19/23 0757  BP: (!) 145/85 (!) 153/96  Pulse: 99 84  Resp: 20 20  Temp: 37.2 C 36.8 C  SpO2: 100% 98%    Complications: No apparent anesthesia complications

## 2023-06-19 NOTE — Progress Notes (Signed)
 SATURATION QUALIFICATIONS: (This note is used to comply with regulatory documentation for home oxygen)  Patient Saturations on Room Air at Rest = 98%  Patient Saturations on Room Air while Ambulating =95%  Patient Saturations on 2 Liters of oxygen while Ambulating = 99%  Please briefly explain why patient needs home oxygen:pt will not qualify for O2 at home.

## 2023-06-19 NOTE — Progress Notes (Signed)
 Report given to short stay, anticipate pick up of patient via wheelchair around 0800.

## 2023-06-19 NOTE — Anesthesia Preprocedure Evaluation (Addendum)
 Anesthesia Evaluation  Patient identified by MRN, date of birth, ID band Patient awake    Reviewed: Allergy & Precautions, NPO status , Patient's Chart, lab work & pertinent test results  Airway Mallampati: II  TM Distance: >3 FB Neck ROM: Full    Dental no notable dental hx. (+) Teeth Intact, Dental Advisory Given   Pulmonary shortness of breath, COPD, former smoker   Pulmonary exam normal breath sounds clear to auscultation       Cardiovascular hypertension, Pt. on medications + Peripheral Vascular Disease  Normal cardiovascular exam Rhythm:Regular Rate:Normal  TTE 2023  1. Left ventricular ejection fraction, by estimation, is 55 to 60%. The  left ventricle has normal function. The left ventricle has no regional  wall motion abnormalities. There is moderate concentric left ventricular  hypertrophy. Left ventricular  diastolic parameters were normal.   2. Right ventricular systolic function is normal. The right ventricular  size is normal. There is normal pulmonary artery systolic pressure. The  estimated right ventricular systolic pressure is 21.0 mmHg.   3. The mitral valve is grossly normal. No evidence of mitral valve  regurgitation. No evidence of mitral stenosis.   4. The aortic valve is tricuspid. Aortic valve regurgitation is not  visualized. No aortic stenosis is present.   5. The inferior vena cava is dilated in size with >50% respiratory  variability, suggesting right atrial pressure of 8 mmHg.   Stress Test 2023   Findings are consistent with no prior ischemia and no prior myocardial infarction. The study is low risk.   No ST deviation was noted.   Left ventricular function is low normal. Nuclear stress EF: 52 %. The left ventricular ejection fraction is mildly decreased (45-54%). End diastolic cavity size is normal.   Prior study not available for comparison.     Neuro/Psych negative neurological ROS   negative psych ROS   GI/Hepatic ,GERD  ,,(+)     substance abuse  cocaine use and marijuana use  Endo/Other  diabetes, Type 2, Oral Hypoglycemic Agents    Renal/GU negative Renal ROS  negative genitourinary   Musculoskeletal  (+) Arthritis ,    Abdominal   Peds  Hematology  (+) Blood dyscrasia (non-hodgkin's lymphoma), anemia Lab Results      Component                Value               Date                      WBC                      7.2                 06/19/2023                HGB                      8.4 (L)             06/19/2023                HCT                      27.6 (L)            06/19/2023  MCV                      84.7                06/19/2023                PLT                      494 (H)             06/19/2023              Anesthesia Other Findings metastatic squamous cell carcinoma of the larynx. Symptoms include dysphagia and SOB.  Reproductive/Obstetrics                             Anesthesia Physical Anesthesia Plan  ASA: 3  Anesthesia Plan: General   Post-op Pain Management: Ofirmev  IV (intra-op)*   Induction: Intravenous and Rapid sequence  PONV Risk Score and Plan: 2 and Midazolam , Dexamethasone  and Ondansetron   Airway Management Planned: Oral ETT and Video Laryngoscope Planned  Additional Equipment:   Intra-op Plan:   Post-operative Plan: Extubation in OR  Informed Consent: I have reviewed the patients History and Physical, chart, labs and discussed the procedure including the risks, benefits and alternatives for the proposed anesthesia with the patient or authorized representative who has indicated his/her understanding and acceptance.     Dental advisory given  Plan Discussed with: CRNA  Anesthesia Plan Comments:        Anesthesia Quick Evaluation

## 2023-06-19 NOTE — Op Note (Signed)
 Patient: Jesus [name] (April 25, 1956, [id number])  Date of Surgery: 06/19/2023  Preoperative Diagnosis: ESOPHAGEAL CANCER   Postoperative Diagnosis: ESOPHAGEAL CANCER   Surgical Procedure: LAPAROSCOPIC INSERTION GASTROSTOMY TUBE: 53664 (CPT)   Operative Team Members:  Surgeons and Role:    * Lashana Spang, Avon Boers, MD - Primary   Anesthesiologist: Grace Laura, MD CRNA: Bernhard Brine, CRNA   Anesthesia: General   Fluids:  Total I/O In: 700 [I.V.:700] Out: -   Complications: None  Drains:  none   Specimen: None  Disposition:  PACU - hemodynamically stable.  Plan of Care:  Continue inpatient care    Indications for Procedure: Jesus [name] is a 68 y.o. male who presented with laryngeal cancer.  Gastrostomy tube was requested.  I recommended laparoscopic gastrostomy tube placement.  The procedure itself as well as its risks, benefits and alternatives were discussed.  The risks discussed included but were not limited to the risk of infection, bleeding, damage to nearby structures, and chronic tube management and difficulties.  After a full discussion and all questions answered the patient granted consent to proceed.  Findings: Normal anatomy   Description of Procedure:   On the date stated above the patient was taken the operating room suite and placed in supine position.  General endotracheal anesthesia was induced.  Timeouts completed verifying correct patient, procedure, position, and equipment needed for the case.  The patient's abdomen was prepped and draped in usual sterile fashion.  Unasyn  was given on a scheduled basis.  I began by making a 5 mm incision at the umbilicus.  I grasped and elevated the umbilical stalk using a Coker and inflated the abdomen using a Veress needle.  Three 5 mm trocars were placed one of the umbilicus and 2 in the right upper quadrant.  Identified the stomach and a location below the ribs on the abdominal wall for the gastrostomy  tube.  2-0 silk sutures were placed along the anterior wall of the stomach near the greater curve.  They were placed through the stomach laparoscopically and then brought through the abdominal wall using a PMI suture passer to pexied the stomach to the abdominal wall.  A pursestring suture was then positioned between these using a third 0 silk suture.  The 5 mm trocar was then placed through the future track for the G-tube.  A gastrotomy was made using the harmonic in the center of the pursestring suture.  The G-tube was then introduced into the abdomen inserted into the gastrotomy.  The balloon was tested intra-abdominal a first and then inflated in the lumen of the stomach.  I advanced the G-tube as far as I could to get out of the way as I tied down the sutures and sewed additional sutures.  First the pursestring suture was tied down.  Then I took a 2 oh STRATAFIX spiral suture and fixed the anterior gastric wall to the anterior abdominal wall circumferentially around the G-tube site.  The silk sutures were then tied down and the balloon was pulled snug against the abdominal wall.  It sat at 5 cm at the skin.  The bumper was pushed against the skin.  The umbilical hernia site was closed using 0 Vicryl suture in a PMI suture passer.  The abdomen was deflated and the skin was closed using 4-0 Monocryl and Dermabond.  All sponge needle counts were correct at the end this case.  At the end of the case we reviewed the infection status of the  case. Patient: Jesus [name] Emergency General Surgery Service Patient Case: Urgent Infection Present At Time Of Surgery (PATOS):  Contamination related to creating a gastrostomy  Teddie Favre, MD General, Bariatric, & Minimally Invasive Surgery [facility], [place]

## 2023-06-20 ENCOUNTER — Encounter (HOSPITAL_COMMUNITY): Payer: Self-pay | Admitting: Surgery

## 2023-06-20 DIAGNOSIS — E43 Unspecified severe protein-calorie malnutrition: Secondary | ICD-10-CM | POA: Insufficient documentation

## 2023-06-20 DIAGNOSIS — R1310 Dysphagia, unspecified: Secondary | ICD-10-CM | POA: Diagnosis not present

## 2023-06-20 LAB — GLUCOSE, CAPILLARY
Glucose-Capillary: 84 mg/dL (ref 70–99)
Glucose-Capillary: 118 mg/dL — ABNORMAL HIGH (ref 70–99)
Glucose-Capillary: 125 mg/dL — ABNORMAL HIGH (ref 70–99)

## 2023-06-20 LAB — CBC
HCT: 25.7 % — ABNORMAL LOW (ref 39.0–52.0)
Hemoglobin: 7.7 g/dL — ABNORMAL LOW (ref 13.0–17.0)
MCH: 25.3 pg — ABNORMAL LOW (ref 26.0–34.0)
MCHC: 30 g/dL (ref 30.0–36.0)
MCV: 84.5 fL (ref 80.0–100.0)
Platelets: 470 10*3/uL — ABNORMAL HIGH (ref 150–400)
RBC: 3.04 MIL/uL — ABNORMAL LOW (ref 4.22–5.81)
RDW: 13.6 % (ref 11.5–15.5)
WBC: 5.9 10*3/uL (ref 4.0–10.5)
nRBC: 0 % (ref 0.0–0.2)

## 2023-06-20 LAB — PHOSPHORUS: Phosphorus: 2.4 mg/dL — ABNORMAL LOW (ref 2.5–4.6)

## 2023-06-20 LAB — MAGNESIUM: Magnesium: 1.7 mg/dL (ref 1.7–2.4)

## 2023-06-20 MED ORDER — ACETAMINOPHEN 160 MG/5ML PO SOLN: 650.0000 mg | Freq: Four times a day (QID) | ORAL | 0 refills | Status: DC | PRN

## 2023-06-20 MED ORDER — OSMOLITE 1.5 CAL PO LIQD
1000.0000 mL | ORAL | Status: AC
  Administered 2023-06-20 – 2023-06-21 (×2): 1000 mL
  Filled 2023-06-20 (×6): qty 1000

## 2023-06-20 MED ORDER — PROSOURCE TF20 ENFIT COMPATIBL EN LIQD
60.0000 mL | Freq: Two times a day (BID) | ENTERAL | Status: DC
  Administered 2023-06-21 – 2023-06-24 (×6): 60 mL
  Filled 2023-06-20 (×9): qty 60

## 2023-06-20 MED ORDER — POLYETHYLENE GLYCOL 3350 17 G PO PACK
17.0000 g | PACK | Freq: Every day | ORAL | Status: DC | PRN
  Administered 2023-06-20 – 2023-06-22 (×2): 17 g
  Filled 2023-06-20 (×3): qty 1

## 2023-06-20 MED ORDER — VITAL HIGH PROTEIN PO LIQD
1000.0000 mL | ORAL | Status: DC
  Filled 2023-06-20: qty 1000

## 2023-06-20 MED ORDER — OXYCODONE HCL 5 MG/5ML PO SOLN
5.0000 mg | Freq: Four times a day (QID) | ORAL | 0 refills | Status: DC | PRN
Start: 2023-06-20 — End: 2023-10-14

## 2023-06-20 MED ORDER — FREE WATER
175.0000 mL | Status: DC
  Administered 2023-06-20 – 2023-06-23 (×16): 175 mL

## 2023-06-20 MED ORDER — THIAMINE MONONITRATE 100 MG PO TABS
100.0000 mg | ORAL_TABLET | Freq: Every day | ORAL | Status: DC
  Administered 2023-06-20 – 2023-06-24 (×4): 100 mg
  Filled 2023-06-20 (×4): qty 1

## 2023-06-20 NOTE — Progress Notes (Signed)
1 Day Post-Op  Subjective: CC: Reports he wants to try something by mouth Reports they have not started TF's yet.  Sore around his incisions but pain is well controlled. Has not received any prn pain meds per tube.  Passing flatus. No BM.  Mobilizing. Voiding.   Afebrile. No tachycardia or hypotension. WBC 5.9. Hgb 7.7 from 8.4. Was anemic at 6.9 before surgery and received 1U PRBC 2/9.    Objective: Vital signs in last 24 hours: Temp:  [97.3 F (36.3 C)-98.1 F (36.7 C)] 97.3 F (36.3 C) (02/11 0423) Pulse Rate:  [81-100] 98 (02/11 0423) Resp:  [14-19] 19 (02/11 0423) BP: (133-167)/(74-91) 164/86 (02/11 0423) SpO2:  [97 %-100 %] 97 % (02/11 0423) Last BM Date : 06/12/23  Intake/Output from previous day: 02/10 0701 - 02/11 0700 In: 2347.6 [I.V.:1767.6; IV Piggyback:200] Out: 10 [Blood:10] Intake/Output this shift: No intake/output data recorded.  PE: Gen:  Alert, NAD, pleasant Abd: Soft, no obvious distension, appropriately tender around laparoscopic incisions and G-tube. No rigidity or guarding and otherwise NT, +BS. Incisions with glue intact appears well and are without drainage, bleeding, or signs of infection. G-tube in place, clamped, site cdi.   Lab Results:  Recent Labs    06/19/23 0325 06/20/23 0342  WBC 7.2 5.9  HGB 8.4* 7.7*  HCT 27.6* 25.7*  PLT 494* 470*   BMET Recent Labs    06/18/23 0338 06/19/23 0325  NA 139 138  K 4.1 3.9  CL 106 104  CO2 23 24  GLUCOSE 100* 83  BUN 11 10  CREATININE 1.03 0.89  CALCIUM 8.8* 9.0   PT/INR No results for input(s): "LABPROT", "INR" in the last 72 hours. CMP     Component Value Date/Time   NA 138 06/19/2023 0325   NA 143 08/21/2019 1459   K 3.9 06/19/2023 0325   CL 104 06/19/2023 0325   CO2 24 06/19/2023 0325   GLUCOSE 83 06/19/2023 0325   BUN 10 06/19/2023 0325   BUN 17 08/21/2019 1459   CREATININE 0.89 06/19/2023 0325   CREATININE 1.20 06/13/2023 1452   CREATININE 1.09 04/29/2014 1930    CALCIUM 9.0 06/19/2023 0325   PROT 8.7 (H) 06/15/2023 1916   PROT 7.0 08/21/2019 1459   ALBUMIN 3.5 06/15/2023 1916   ALBUMIN 4.7 08/21/2019 1459   AST 12 (L) 06/15/2023 1916   AST 10 (L) 06/13/2023 1452   ALT 12 06/15/2023 1916   ALT 8 06/13/2023 1452   ALKPHOS 76 06/15/2023 1916   BILITOT 0.7 06/15/2023 1916   BILITOT 0.4 06/13/2023 1452   GFRNONAA >60 06/19/2023 0325   GFRNONAA >60 06/13/2023 1452   GFRNONAA 74 04/29/2014 1930   GFRAA >60 11/28/2019 1329   GFRAA 86 04/29/2014 1930   Lipase  No results found for: "LIPASE"  Studies/Results: No results found.  Anti-infectives: Anti-infectives (From admission, onward)    Start     Dose/Rate Route Frequency Ordered Stop   06/18/23 1700  Ampicillin-Sulbactam (UNASYN) 3 g in sodium chloride 0.9 % 100 mL IVPB  Status:  Discontinued        3 g 200 mL/hr over 30 Minutes Intravenous Every 6 hours 06/18/23 1522 06/19/23 1539   06/17/23 0000  vancomycin (VANCOREADY) IVPB 1500 mg/300 mL  Status:  Discontinued        1,500 mg 150 mL/hr over 120 Minutes Intravenous Every 24 hours 06/16/23 0137 06/16/23 0923   06/16/23 1100  vancomycin (VANCOCIN) IVPB 1000 mg/200 mL premix  Status:  Discontinued        1,000 mg 200 mL/hr over 60 Minutes Intravenous Every 12 hours 06/16/23 0923 06/18/23 1522   06/15/23 2315  vancomycin (VANCOREADY) IVPB 2000 mg/400 mL        2,000 mg 200 mL/hr over 120 Minutes Intravenous  Once 06/15/23 2303 06/16/23 0227   06/15/23 2315  ceFEPIme (MAXIPIME) 2 g in sodium chloride 0.9 % 100 mL IVPB  Status:  Discontinued        2 g 200 mL/hr over 30 Minutes Intravenous Every 8 hours 06/15/23 2303 06/18/23 1522        Assessment/Plan POD 1 s/p lap g-tube by Dr. Dossie Der on 06/19/23 for progressive dysphagia in the setting of metastatic laryngeal SCC on Keytruda followed by Dr. Pollyann Kennedy  - Molli Knock to use G-tube (meds and TF's). Solution pain medication ordered. Defer TF's to primary team. Recommend HH to assist w/ TF's  - talked to [facility] about this. Please flush tube with 30 ml (20 ml if fluid restricted) sterile water every 4 hours, before and after medication administration, and when continuous feeding are interrupted. Please keep abdominal binder in place.  - Defer PO diet to primary. Currently NPO and has had progressive dysphagia in the setting of above - Mobilize, pulm toilet - Will send pain medication to his pharmacy and arrange f/u Anemia - He was anemic with hgb 6.9 before surgery and received 1U PRBC 2/9. Hgb today 7.7 from 8.4. No external signs of bleeding and abdomen soft. HDS without tachycardia or hypotension. Repeat labs in am or before d/c if sooner.   FEN - NPO, okay for TF's. Okay for liquid colace or miralax per tube.  VTE - SCDs, okay for chem ppx from our standpoint ID - Peri-op, none currently.     LOS: 4 days    Jacinto Halim , [facility] 06/20/2023, 9:20 AM Please see Amion for pager number during day hours 7:00am-4:30pm

## 2023-06-20 NOTE — Progress Notes (Signed)
PROGRESS NOTE    Jesus [name]  ZOX:[id number] DOB: Mar 18, 1956 DOA: 06/15/2023 PCP: Noberto Retort, MD    Brief Narrative:   Jesus [name] is a 68 y.o. 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  Has not been able to eat or drink anything over the last 3 days.  Additionally unable to control his saliva.  Follows with ENT, Dr. Pollyann Kennedy outpatient.  Was informed by ENT if his ability to swallow became worse to seek further care in the ED.  In the ED, temperature 98.1 F, HR 90, RR 15, BP 110/57, SpO2 97% on room air.  WBC 9.5, hemoglobin 8.2, platelet count 540.  Sodium 136, potassium 3.9, chloride 98, CO2 27, glucose 136, BUN 18, Cram 1.26.  AST 12, ALT 12, total bili 0.7.  High sensitive troponin 9.  Lactic acid 0.7.  COVID/influenza/RSV PCR negative.  Chest x-ray with no active cardiopulmonary disease process, rightward tracheal deviation with left peritracheal opacity corresponding to likely mass seen on prior PET/CT.  Soft tissue neck x-ray with slightly thickened appearance of the epiglottis and slightly hazy appearance of the glottic area suggesting mild edema or inflammation; possible mild narrowing of the subglottic trachea as well.  CT soft tissue neck with circumferential soft tissue abnormality surrounding the trachea posteriorly displacing the esophagus to the level of the thoracic inlet, worsened since 04/14/2023 and consistent with progression of glottic squamous cell carcinoma, acute inflammatory change of above math likely postradiation effect.  CT angiogram chest with no evidence of pulmonary embolism.  Blood cultures x 2 collected.  Patient was started empiric antibiotics with vancomycin and cefepime.  TRH consulted for admission for  further evaluation management of dysphagia likely secondary to progressive metastatic laryngeal squamous cell carcinoma.  Assessment & Plan:   Dysphagia secondary to laryngeal squamous cell carcinoma Patient follows with ENT, Dr. Pollyann Kennedy and medical oncology, Dr. Candise Che outpatient.  Continues on immunotherapy with Pembrolizumab.  CT imaging on admission with circumferential soft tissue abnormality surround the trachea posterior displacing the esophagus to the level of thoracic inlet, progressive since 04/14/2023.  Denies any shortness of breath or respiratory compromise.  General surgery was consulted and patient underwent laparoscopic insertion of gastrostomy tube by Dr. Dossie Der on 06/19/2023. -- RD consulted to initiate tube feeds -- Osmolite 1.5 at 20 mL/h; advance by 10mL q12h to gaol rate 65 mL/h; FWF q4h -- Prosource 60 mL per tube twice daily -- Flush tube with 30 mL sterile water every 4 hours; before and after medication administration -- Continue abdominal binder -- DME for tube feeding pump ordered; TOC consulted -- Home health orders for RN placed -- Plan DC home once tube feeds at goal with toleration -- Outpatient follow-up with ENT for definitive treatment of dysphagia secondary to laryngeal squamous cell carcinoma  Concern for pulmonary infiltrate, ruled out CT chest with questionable infiltrate, although patient asymptomatic and CT findings vague with motion artifact, edema.  Initially started on antibiotics and remained afebrile with no further symptoms antibiotics were discontinued. --Aspiration precautions as above  Acute renal failure secondary to prerenal azotemia Patient presenting with an elevated creatinine of 1.26 in the setting of poor oral intake due to his underlying dysphagia.  Etiology likely secondary to dehydration.  Supported with IV fluids. -- Cr 1.26>0.89 -- Started  on tube feeds -- repeat BMP in the am  Excessive secretion -- Robinul PRN -- DME  for home suction unit place, TOC consulted  Anemia Etiology likely secondary to chronic medical disease -- Hgb 8.2>>7.0>6.9>8.4>7.7 -- Transfused 1 unit PRBC on 06/18/2023 -- Check anemia panel -- Repeat CBC in the a.m.  Hx hypertension No longer taking antihypertensives/amlodipine. -- continue to monitor BP closely   DVT prophylaxis: SCDs Start: 06/16/23 0122    Code Status: Full Code Family Communication: No family present bedside this morning  Disposition Plan:  Level of care: Med-Surg Status is: Inpatient Remains inpatient appropriate because: Starting tube feeds, needs further titration to goal rate with toleration with a plan discharge home thereafter    Consultants:  General surgery  Procedures:  Surgical G-tube placement, Dr. Dossie Der;  06/19/2023  Antimicrobials:  Vancomycin 2/6 - 2/9 Cefepime 2/6 - 2/9 Ampicillin 2/9 - 2/10   Subjective: Patient seen examined bedside, sitting in bedside chair.  No complaints other than some abdominal soreness surrounding G-tube site.  Asking for something to attempt to drink this morning.  Discussed with nutrition, plan to initiate tube feeds today.  Discussed with patient once his tube feeds were titrated goal will be ready for discharge home.  No dysphasia complaints, concerns or questions at this time.  Denies headache, no dizziness, no chest pain, no palpitations, no shortness of breath, no fever/chills/night sweats, no nausea/vomiting/diarrhea, no focal weakness, no fatigue, no paresthesia.  No acute events overnight per nurse staff.  Objective: Vitals:   06/19/23 2157 06/20/23 0126 06/20/23 0423 06/20/23 0927  BP: 136/89 (!) 146/90 (!) 164/86 136/85  Pulse: 92 91 98 78  Resp: 18 17 19 14   Temp: 98 F (36.7 C) 98.1 F (36.7 C) (!) 97.3 F (36.3 C) 97.8 F (36.6 C)  TempSrc: Oral Oral Oral Oral  SpO2: 97% 99% 97% 99%  Weight:      Height:        Intake/Output Summary (Last 24 hours) at 06/20/2023 1311 Last data  filed at 06/20/2023 0700 Gross per 24 hour  Intake 1647.57 ml  Output 0 ml  Net 1647.57 ml   Filed Weights   06/16/23 0810  Weight: 108.4 kg    Examination:  Physical Exam: GEN: NAD, alert and oriented x 3, wd/wn HEENT: NCAT, PERRL, EOMI, sclera clear, MMM PULM: CTAB w/o wheezes/crackles, normal respiratory effort, on room air CV: RRR w/o M/G/R GI: abd soft, NTND, NABS, no R/G/M, G-tube noted in place MSK: no peripheral edema, moves all extremities independently NEURO: No focal neurological deficits PSYCH: normal mood/affect Integumentary: No concerning ashes/lesions/wounds noted on exposed skin surfaces    Data Reviewed: I have personally reviewed following labs and imaging studies  CBC: Recent Labs  Lab 06/13/23 1452 06/15/23 1916 06/16/23 0422 06/17/23 0420 06/18/23 0338 06/19/23 0325 06/20/23 0342  WBC 8.1   < > 7.4 6.2 6.2 7.2 5.9  NEUTROABS 5.9  --  5.0  --   --   --   --   HGB 8.1*   < > 7.2* 7.0* 6.9* 8.4* 7.7*  HCT 26.8*   < > 23.8* 23.6* 23.2* 27.6* 25.7*  MCV 83.5   < > 85.6 85.5 85.6 84.7 84.5  PLT 524*   < > 469* 489* 478* 494* 470*   < > = values in this interval not displayed.   Basic Metabolic Panel: Recent Labs  Lab 06/15/23 1916 06/16/23 0422 06/17/23 0420 06/18/23 0338 06/19/23 0325  NA 136 135 137 139  138  K 3.9 3.8 3.8 4.1 3.9  CL 98 101 105 106 104  CO2 27 24 23 23 24   GLUCOSE 136* 119* 108* 100* 83  BUN 18 16 14 11 10   CREATININE 1.26* 0.98 1.00 1.03 0.89  CALCIUM 9.5 8.6* 8.7* 8.8* 9.0   GFR: Estimated Creatinine Clearance: 102.4 mL/min (by C-G formula based on SCr of 0.89 mg/dL). Liver Function Tests: Recent Labs  Lab 06/13/23 1452 06/15/23 1916  AST 10* 12*  ALT 8 12  ALKPHOS 82 76  BILITOT 0.4 0.7  PROT 8.5* 8.7*  ALBUMIN 3.8 3.5   No results for input(s): "LIPASE", "AMYLASE" in the last 168 hours. No results for input(s): "AMMONIA" in the last 168 hours. Coagulation Profile: No results for input(s): "INR",  "PROTIME" in the last 168 hours. Cardiac Enzymes: No results for input(s): "CKTOTAL", "CKMB", "CKMBINDEX", "TROPONINI" in the last 168 hours. BNP (last 3 results) No results for input(s): "PROBNP" in the last 8760 hours. HbA1C: No results for input(s): "HGBA1C" in the last 72 hours. CBG: No results for input(s): "GLUCAP" in the last 168 hours. Lipid Profile: No results for input(s): "CHOL", "HDL", "LDLCALC", "TRIG", "CHOLHDL", "LDLDIRECT" in the last 72 hours. Thyroid Function Tests: No results for input(s): "TSH", "T4TOTAL", "FREET4", "T3FREE", "THYROIDAB" in the last 72 hours. Anemia Panel: No results for input(s): "VITAMINB12", "FOLATE", "FERRITIN", "TIBC", "IRON", "RETICCTPCT" in the last 72 hours. Sepsis Labs: Recent Labs  Lab 06/15/23 2034  LATICACIDVEN 0.7    Recent Results (from the past 240 hours)  Resp panel by RT-PCR (RSV, Flu A&B, Covid) Anterior Nasal Swab     Status: None   Collection Time: 06/15/23  7:16 PM   Specimen: Anterior Nasal Swab  Result Value Ref Range Status   SARS Coronavirus 2 by RT PCR NEGATIVE NEGATIVE Final    Comment: (NOTE) SARS-CoV-2 target nucleic acids are NOT DETECTED.  The SARS-CoV-2 RNA is generally detectable in upper respiratory specimens during the acute phase of infection. The lowest concentration of SARS-CoV-2 viral copies this assay can detect is 138 copies/mL. A negative result does not preclude SARS-Cov-2 infection and should not be used as the sole basis for treatment or other patient management decisions. A negative result may occur with  improper specimen collection/handling, submission of specimen other than nasopharyngeal swab, presence of viral mutation(s) within the areas targeted by this assay, and inadequate number of viral copies(<138 copies/mL). A negative result must be combined with clinical observations, patient history, and epidemiological information. The expected result is Negative.  Fact Sheet for Patients:   BloggerCourse.com  Fact Sheet for Healthcare Providers:  SeriousBroker.it  This test is no t yet approved or cleared by the [place] FDA and  has been authorized for detection and/or diagnosis of SARS-CoV-2 by FDA under an Emergency Use Authorization (EUA). This EUA will remain  in effect (meaning this test can be used) for the duration of the COVID-19 declaration under Section 564(b)(1) of the Act, 21 U.S.C.section 360bbb-3(b)(1), unless the authorization is terminated  or revoked sooner.       Influenza A by PCR NEGATIVE NEGATIVE Final   Influenza B by PCR NEGATIVE NEGATIVE Final    Comment: (NOTE) The Xpert Xpress SARS-CoV-2/FLU/RSV plus assay is intended as an aid in the diagnosis of influenza from Nasopharyngeal swab specimens and should not be used as a sole basis for treatment. Nasal washings and aspirates are unacceptable for Xpert Xpress SARS-CoV-2/FLU/RSV testing.  Fact Sheet for Patients: BloggerCourse.com  Fact Sheet for Healthcare  Providers: SeriousBroker.it  This test is not yet approved or cleared by the [place] and has been authorized for detection and/or diagnosis of SARS-CoV-2 by FDA under an Emergency Use Authorization (EUA). This EUA will remain in effect (meaning this test can be used) for the duration of the COVID-19 declaration under Section 564(b)(1) of the Act, 21 U.S.C. section 360bbb-3(b)(1), unless the authorization is terminated or revoked.     Resp Syncytial Virus by PCR NEGATIVE NEGATIVE Final    Comment: (NOTE) Fact Sheet for Patients: BloggerCourse.com  Fact Sheet for Healthcare Providers: SeriousBroker.it  This test is not yet approved or cleared by the [place] FDA and has been authorized for detection and/or diagnosis of SARS-CoV-2 by FDA under an Emergency Use  Authorization (EUA). This EUA will remain in effect (meaning this test can be used) for the duration of the COVID-19 declaration under Section 564(b)(1) of the Act, 21 U.S.C. section 360bbb-3(b)(1), unless the authorization is terminated or revoked.  Performed at [facility], [address], [place], [place] [address]   MRSA Next Gen by PCR, Nasal     Status: None   Collection Time: 06/16/23 10:03 AM   Specimen: Nasal Mucosa; Nasal Swab  Result Value Ref Range Status   MRSA by PCR Next Gen NOT DETECTED NOT DETECTED Final    Comment: (NOTE) The GeneXpert MRSA Assay (FDA approved for NASAL specimens only), is one component of a comprehensive MRSA colonization surveillance program. It is not intended to diagnose MRSA infection nor to guide or monitor treatment for MRSA infections. Test performance is not FDA approved in patients less than 64 years old. Performed at [facility], [address], [place], [place] [address]   Surgical pcr screen     Status: None   Collection Time: 06/18/23  5:15 PM   Specimen: Nasal Mucosa; Nasal Swab  Result Value Ref Range Status   MRSA, PCR NEGATIVE NEGATIVE Final   Staphylococcus aureus NEGATIVE NEGATIVE Final    Comment: (NOTE) The Xpert SA Assay (FDA approved for NASAL specimens in patients 49 years of age and older), is one component of a comprehensive surveillance program. It is not intended to diagnose infection nor to guide or monitor treatment. Performed at [facility], [address], [place], [place] [address]          Radiology Studies: No results found.      Scheduled Meds:  sodium chloride   Intravenous Once   Chlorhexidine Gluconate Cloth  6 each Topical Daily   feeding supplement (PROSource TF20)  60 mL Per Tube BID   free water  175 mL Per Tube Q4H   sodium chloride flush  10-40 mL Intracatheter Q12H   thiamine  100 mg Per Tube Daily   Continuous  Infusions:  feeding supplement (OSMOLITE 1.5 CAL) 1,000 mL (06/20/23 1255)     LOS: 4 days    Time spent: 54 minutes spent on chart review, discussion with nursing staff, consultants, updating family and interview/physical exam; more than 50% of that time was spent in counseling and/or coordination of care.    Alvira Philips [place], DO [facility] Available via Epic secure chat 7am-7pm After these hours, please refer to coverage provider listed on [email or web address] 06/20/2023, 1:11 PM

## 2023-06-20 NOTE — Progress Notes (Addendum)
Initial Nutrition Assessment  DOCUMENTATION CODES:   Severe malnutrition in context of chronic illness  INTERVENTION:  - Initiate tube feeding via G-tube: Osmolite 1.5 at 65 ml/h (1560 ml per day) *Start at 76mL/hr and advance by 10mL Q12H Prosource TF20 60 ml BID +133mL Q4H FWF ( ) Provides 2500 kcal, 138 gm protein, 2239 ml free water daily  - Monitor magnesium, potassium, and phosphorus BID for at least 3 days, MD to replete as needed, as pt is at risk for refeeding syndrome given significant weight loss and ongoing inadequate oral intake. - Add 100mg  thiamine x5 days due to risk of refeeding.  - CLD per MD.   - Monitor weight trends.   - Bolus TF recs for once tolerating continuous feeds and for discharge as below: 7 cartons of Osmolite 1.5 Can consider: 2 cartons @ 7am, 12pm, 5pm, and 1 carton @ 9pm +38mL before and after each bolus AND free water flushes 6x daily (total of ) Provides 2489 kcal, 104 gm protein, 2254 ml free water daily *Patient will need to try and consume 1 Ensure Max daily to meet protein needs.  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  GOAL:   Patient will meet greater than or equal to 90% of their needs  MONITOR:   PO intake, Supplement acceptance, Diet advancement, Weight trends, TF tolerance  REASON FOR ASSESSMENT:   Consult Enteral/tube feeding initiation and management  ASSESSMENT:   68 y.o. 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   2/6 Admit 2/10 G-tube placed  Patient reports a UBW of 375# and weight loss since around May of last year due to cancer and progressive dysphagia.  Per EMR, patient weighed at 324# in March 2024 and now weighed at 239#. This is an 85# or 26% weight loss in ~1 year, which is significant for the time frame.   Patient shares that he had been losing weight with cancer/treatment but it stared escalating when his dysphagia began, which was around October. States this has gotten progressively worse and the last time he had solid food was ~1 month ago. He had previously been able to drink 4 Boost/Ensures a day but this has dwindled to only 1 and over the past few days he has not been able to consume any.  Patient had G-tube placed yesterday, cleared by Gen Surg for use. Discussed with Hospitalist MD, can start tube feeds today.  Will plan to start patient on continuous feeds for now due to risk of refeeding syndrome. Will also add thiamine. Dicussed plan for slow increase of tube feeds with RN.  Plan for bolus TF on discharge. Patient wil llikely not be able to get protein modulars covered after discharge so will plan to also recommend an Ensure Max Po daily to meet protein needs. Discussed with MD who is on board.  Patient had previously been followed by outpatient cancer RD but was doing well in September so no follow up scheduled.  Plan for patient to re-establish with outpatient cancer RD's for  management of tube feeds after discharge. Discussed with outpatient RD.   Medications reviewed and include: -  Labs reviewed:  -   NUTRITION - FOCUSED PHYSICAL EXAM:  Flowsheet Row Most Recent Value  Orbital Region No depletion  Upper Arm Region Mild depletion  Thoracic and Lumbar Region Unable to assess  Buccal Region No depletion  Temple Region Mild depletion  Clavicle Bone Region No depletion  Clavicle and Acromion Bone Region Mild depletion  Scapular Bone Region Unable  to assess  Dorsal Hand No depletion  Patellar Region No depletion  Anterior Thigh Region No depletion  Posterior Calf Region No depletion  Edema (RD Assessment) None  Hair Reviewed  Eyes Reviewed  Mouth Reviewed  Skin Reviewed  Nails Reviewed       Diet Order:   Diet Order             Diet clear liquid Room service appropriate? Yes; Fluid consistency: Thin  Diet effective now                   EDUCATION NEEDS:  Education needs have been addressed  Skin:  Skin Assessment: Reviewed RN Assessment  Last BM:  2/3  Height:  Ht Readings from Last 1 Encounters:  06/16/23 6' (1.829 m)   Weight:  Wt Readings from Last 1 Encounters:  06/16/23 108.4 kg   Ideal Body Weight:  80.91 kg  BMI:  Body mass index is 32.41 kg/m.  Estimated Nutritional Needs:  Kcal:  2450-2650 kcals Protein:  130-150 grams Fluid:  >/= 2.2L    Shelle Iron RD, LDN Contact via Secure Chat.

## 2023-06-21 ENCOUNTER — Encounter (HOSPITAL_COMMUNITY): Payer: Self-pay | Admitting: Family Medicine

## 2023-06-21 ENCOUNTER — Inpatient Hospital Stay (HOSPITAL_COMMUNITY): Payer: PPO

## 2023-06-21 ENCOUNTER — Encounter (HOSPITAL_COMMUNITY)
Admission: EM | Disposition: A | Discharge status: Home / Self Care | Payer: Self-pay | Source: Home / Self Care | Attending: Internal Medicine

## 2023-06-21 ENCOUNTER — Inpatient Hospital Stay (HOSPITAL_COMMUNITY): Payer: PPO | Admitting: Anesthesiology

## 2023-06-21 ENCOUNTER — Other Ambulatory Visit: Payer: Self-pay

## 2023-06-21 DIAGNOSIS — E119 Type 2 diabetes mellitus without complications: Secondary | ICD-10-CM | POA: Diagnosis not present

## 2023-06-21 DIAGNOSIS — I10 Essential (primary) hypertension: Secondary | ICD-10-CM | POA: Diagnosis not present

## 2023-06-21 DIAGNOSIS — T85528A Displacement of other gastrointestinal prosthetic devices, implants and grafts, initial encounter: Secondary | ICD-10-CM | POA: Diagnosis not present

## 2023-06-21 DIAGNOSIS — C329 Malignant neoplasm of larynx, unspecified: Secondary | ICD-10-CM

## 2023-06-21 DIAGNOSIS — R1310 Dysphagia, unspecified: Secondary | ICD-10-CM | POA: Diagnosis not present

## 2023-06-21 DIAGNOSIS — J449 Chronic obstructive pulmonary disease, unspecified: Secondary | ICD-10-CM

## 2023-06-21 HISTORY — PX: IR REPLACE G-TUBE SIMPLE WO FLUORO: IMG2323

## 2023-06-21 HISTORY — PX: LAPAROSCOPIC INSERTION GASTROSTOMY TUBE: SHX6817

## 2023-06-21 LAB — MAGNESIUM
Magnesium: 1.9 mg/dL (ref 1.7–2.4)
Magnesium: 1.5 mg/dL — ABNORMAL LOW (ref 1.7–2.4)

## 2023-06-21 LAB — COMPREHENSIVE METABOLIC PANEL
ALT: 12 U/L (ref 0–44)
AST: 15 U/L (ref 15–41)
Albumin: 3 g/dL — ABNORMAL LOW (ref 3.5–5.0)
Alkaline Phosphatase: 56 U/L (ref 38–126)
Anion gap: 9 (ref 5–15)
BUN: 19 mg/dL (ref 8–23)
CO2: 26 mmol/L (ref 22–32)
Calcium: 9 mg/dL (ref 8.9–10.3)
Chloride: 102 mmol/L (ref 98–111)
Creatinine, Ser: 0.93 mg/dL (ref 0.61–1.24)
GFR, Estimated: >60 mL/min (ref >60)
Glucose, Bld: 165 mg/dL — ABNORMAL HIGH (ref 70–99)
Potassium: 3.7 mmol/L (ref 3.5–5.1)
Sodium: 137 mmol/L (ref 135–145)
Total Bilirubin: 0.6 mg/dL (ref 0.0–1.2)
Total Protein: 7.7 g/dL (ref 6.5–8.1)

## 2023-06-21 LAB — PHOSPHORUS
Phosphorus: 2.3 mg/dL — ABNORMAL LOW (ref 2.5–4.6)
Phosphorus: 3.2 mg/dL (ref 2.5–4.6)

## 2023-06-21 LAB — IRON AND TIBC
Iron: 24 ug/dL — ABNORMAL LOW (ref 45–182)
Saturation Ratios: 10 % — ABNORMAL LOW (ref 17.9–39.5)
TIBC: 235 ug/dL — ABNORMAL LOW (ref 250–450)
UIBC: 211 ug/dL

## 2023-06-21 LAB — CBC
HCT: 27.8 % — ABNORMAL LOW (ref 39.0–52.0)
Hemoglobin: 8.3 g/dL — ABNORMAL LOW (ref 13.0–17.0)
MCH: 25.5 pg — ABNORMAL LOW (ref 26.0–34.0)
MCHC: 29.9 g/dL — ABNORMAL LOW (ref 30.0–36.0)
MCV: 85.3 fL (ref 80.0–100.0)
Platelets: 516 10*3/uL — ABNORMAL HIGH (ref 150–400)
RBC: 3.26 MIL/uL — ABNORMAL LOW (ref 4.22–5.81)
RDW: 13.9 % (ref 11.5–15.5)
WBC: 7.7 10*3/uL (ref 4.0–10.5)
nRBC: 0 % (ref 0.0–0.2)

## 2023-06-21 LAB — GLUCOSE, CAPILLARY
Glucose-Capillary: 129 mg/dL — ABNORMAL HIGH (ref 70–99)
Glucose-Capillary: 146 mg/dL — ABNORMAL HIGH (ref 70–99)
Glucose-Capillary: 149 mg/dL — ABNORMAL HIGH (ref 70–99)
Glucose-Capillary: 160 mg/dL — ABNORMAL HIGH (ref 70–99)
Glucose-Capillary: 177 mg/dL — ABNORMAL HIGH (ref 70–99)
Glucose-Capillary: 95 mg/dL (ref 70–99)

## 2023-06-21 LAB — VITAMIN B12: Vitamin B-12: 2376 pg/mL — ABNORMAL HIGH (ref 180–914)

## 2023-06-21 LAB — RETICULOCYTES
Immature Retic Fract: 21.5 % — ABNORMAL HIGH (ref 2.3–15.9)
RBC.: 3.25 MIL/uL — ABNORMAL LOW (ref 4.22–5.81)
Retic Count, Absolute: 51.7 K/uL (ref 19.0–186.0)
Retic Ct Pct: 1.6 % (ref 0.4–3.1)

## 2023-06-21 LAB — FERRITIN: Ferritin: 144 ng/mL (ref 24–336)

## 2023-06-21 LAB — FOLATE: Folate: 28.6 ng/mL

## 2023-06-21 SURGERY — INSERTION, GASTROSTOMY TUBE, PERCUTANEOUS
Anesthesia: General

## 2023-06-21 MED ORDER — ONDANSETRON HCL 4 MG/2ML IJ SOLN: 4.0000 mg | Freq: Once | INTRAMUSCULAR | Status: DC | PRN

## 2023-06-21 MED ORDER — SODIUM PHOSPHATES 45 MMOLE/15ML IV SOLN
15.0000 mmol | Freq: Once | INTRAVENOUS | Status: AC
  Administered 2023-06-21: 15 mmol via INTRAVENOUS
  Filled 2023-06-21 (×2): qty 5

## 2023-06-21 MED ORDER — FENTANYL CITRATE PF 50 MCG/ML IJ SOSY
25.0000 ug | PREFILLED_SYRINGE | INTRAMUSCULAR | Status: DC | PRN
  Administered 2023-06-21: 25 ug via INTRAVENOUS

## 2023-06-21 MED ORDER — PHENYLEPHRINE 80 MCG/ML (10ML) SYRINGE FOR IV PUSH (FOR BLOOD PRESSURE SUPPORT)
PREFILLED_SYRINGE | INTRAVENOUS | Status: DC | PRN
  Administered 2023-06-21: 120 ug via INTRAVENOUS

## 2023-06-21 MED ORDER — CEFAZOLIN SODIUM-DEXTROSE 2-3 GM-%(50ML) IV SOLR
INTRAVENOUS | Status: DC | PRN
Start: 2023-06-21 — End: 2023-06-21
  Administered 2023-06-21: 2 g via INTRAVENOUS

## 2023-06-21 MED ORDER — FENTANYL CITRATE (PF) 100 MCG/2ML IJ SOLN
INTRAMUSCULAR | Status: AC
  Filled 2023-06-21: qty 2

## 2023-06-21 MED ORDER — PROPOFOL 10 MG/ML IV BOLUS
INTRAVENOUS | Status: DC | PRN
  Administered 2023-06-21: 150 mg via INTRAVENOUS

## 2023-06-21 MED ORDER — BUPIVACAINE-EPINEPHRINE 0.25% -1:200000 IJ SOLN
INTRAMUSCULAR | Status: DC | PRN
  Administered 2023-06-21: 30 mL

## 2023-06-21 MED ORDER — DEXAMETHASONE SODIUM PHOSPHATE 10 MG/ML IJ SOLN
INTRAMUSCULAR | Status: AC
  Filled 2023-06-21: qty 1

## 2023-06-21 MED ORDER — LIDOCAINE 2% (20 MG/ML) 5 ML SYRINGE
INTRAMUSCULAR | Status: DC | PRN
  Administered 2023-06-21: 100 mg via INTRAVENOUS

## 2023-06-21 MED ORDER — CEFAZOLIN SODIUM 1 G IJ SOLR
INTRAMUSCULAR | Status: AC
  Filled 2023-06-21: qty 20

## 2023-06-21 MED ORDER — DEXAMETHASONE SODIUM PHOSPHATE 4 MG/ML IJ SOLN
INTRAMUSCULAR | Status: DC | PRN
  Administered 2023-06-21: 10 mg via INTRAVENOUS

## 2023-06-21 MED ORDER — LACTATED RINGERS IV SOLN: INTRAVENOUS | Status: DC

## 2023-06-21 MED ORDER — LIDOCAINE VISCOUS HCL 2 % MT SOLN
OROMUCOSAL | Status: AC
  Filled 2023-06-21: qty 15

## 2023-06-21 MED ORDER — LIDOCAINE VISCOUS HCL 2 % MT SOLN
15.0000 mL | Freq: Once | OROMUCOSAL | Status: AC
  Administered 2023-06-21: 5 mL via TOPICAL
  Filled 2023-06-21: qty 15

## 2023-06-21 MED ORDER — PHENYLEPHRINE 80 MCG/ML (10ML) SYRINGE FOR IV PUSH (FOR BLOOD PRESSURE SUPPORT)
PREFILLED_SYRINGE | INTRAVENOUS | Status: AC
  Filled 2023-06-21: qty 10

## 2023-06-21 MED ORDER — ROCURONIUM BROMIDE 10 MG/ML (PF) SYRINGE
PREFILLED_SYRINGE | INTRAVENOUS | Status: DC | PRN
  Administered 2023-06-21: 60 mg via INTRAVENOUS

## 2023-06-21 MED ORDER — CHLORHEXIDINE GLUCONATE 0.12 % MT SOLN
15.0000 mL | Freq: Once | OROMUCOSAL | Status: AC
  Administered 2023-06-21: 15 mL via OROMUCOSAL

## 2023-06-21 MED ORDER — ONDANSETRON HCL 4 MG/2ML IJ SOLN
INTRAMUSCULAR | Status: DC | PRN
  Administered 2023-06-21: 4 mg via INTRAVENOUS

## 2023-06-21 MED ORDER — FENTANYL CITRATE PF 50 MCG/ML IJ SOSY
PREFILLED_SYRINGE | INTRAMUSCULAR | Status: AC
  Filled 2023-06-21: qty 1

## 2023-06-21 MED ORDER — ROCURONIUM BROMIDE 10 MG/ML (PF) SYRINGE
PREFILLED_SYRINGE | INTRAVENOUS | Status: AC
  Filled 2023-06-21: qty 10

## 2023-06-21 MED ORDER — PROPOFOL 10 MG/ML IV BOLUS
INTRAVENOUS | Status: AC
  Filled 2023-06-21: qty 20

## 2023-06-21 MED ORDER — IOHEXOL 300 MG/ML  SOLN
50.0000 mL | Freq: Once | INTRAMUSCULAR | Status: AC | PRN
  Administered 2023-06-21: 10 mL

## 2023-06-21 MED ORDER — STERILE WATER FOR IRRIGATION IR SOLN
Status: DC | PRN
  Administered 2023-06-21: 1000 mL

## 2023-06-21 MED ORDER — FENTANYL CITRATE (PF) 100 MCG/2ML IJ SOLN
INTRAMUSCULAR | Status: DC | PRN
  Administered 2023-06-21 (×4): 50 ug via INTRAVENOUS

## 2023-06-21 MED ORDER — SUGAMMADEX SODIUM 200 MG/2ML IV SOLN
INTRAVENOUS | Status: DC | PRN
  Administered 2023-06-21: 200 mg via INTRAVENOUS

## 2023-06-21 MED ORDER — HYDROMORPHONE HCL 1 MG/ML IJ SOLN
0.5000 mg | INTRAMUSCULAR | Status: DC | PRN
  Administered 2023-06-21: 0.5 mg via INTRAVENOUS
  Administered 2023-06-24: 1 mg via INTRAVENOUS
  Filled 2023-06-21 (×2): qty 1

## 2023-06-21 MED ORDER — ONDANSETRON HCL 4 MG/2ML IJ SOLN
INTRAMUSCULAR | Status: AC
  Filled 2023-06-21: qty 2

## 2023-06-21 SURGICAL SUPPLY — 29 items
BAG COUNTER SPONGE SURGICOUNT (BAG) IMPLANT
BAG URINE DRAIN 2000ML AR STRL (UROLOGICAL SUPPLIES) ×1 IMPLANT
CHLORAPREP W/TINT 26 (MISCELLANEOUS) ×1 IMPLANT
COVER SURGICAL LIGHT HANDLE (MISCELLANEOUS) ×1 IMPLANT
DRAPE LAPAROSCOPIC ABDOMINAL (DRAPES) ×1 IMPLANT
DRAPE WARM FLUID 44X44 (DRAPES) ×1 IMPLANT
G-TUBE MIC BOLUS 22FR ENFIT (TUBING) IMPLANT
GAUZE SPONGE 4X4 12PLY STRL (GAUZE/BANDAGES/DRESSINGS) IMPLANT
GLOVE BIO SURGEON STRL SZ7.5 (GLOVE) ×1 IMPLANT
GLOVE INDICATOR 8.0 STRL GRN (GLOVE) ×1 IMPLANT
GOWN STRL REUS W/ TWL XL LVL3 (GOWN DISPOSABLE) ×2 IMPLANT
HANDLE SUCTION POOLE (INSTRUMENTS) IMPLANT
KIT BASIN OR (CUSTOM PROCEDURE TRAY) ×1 IMPLANT
KIT INTRO ENDO 22F F/GAST TUBE (SET/KITS/TRAYS/PACK) IMPLANT
KIT INTRODUCER MIC G-18 (SET/KITS/TRAYS/PACK) ×1 IMPLANT
NDL HYPO 22X1.5 SAFETY MO (MISCELLANEOUS) IMPLANT
NEEDLE HYPO 22X1.5 SAFETY MO (MISCELLANEOUS) IMPLANT
PACK GENERAL/GYN (CUSTOM PROCEDURE TRAY) ×1 IMPLANT
SPONGE DRAIN TRACH 4X4 STRL 2S (GAUZE/BANDAGES/DRESSINGS) ×1 IMPLANT
STAPLER SKIN PROX WIDE 3.9 (STAPLE) IMPLANT
SUCTION POOLE HANDLE (INSTRUMENTS) IMPLANT
SUT ETHILON 2 0 PS N (SUTURE) ×1 IMPLANT
SUT MNCRL AB 4-0 PS2 18 (SUTURE) ×1 IMPLANT
SUT PDS AB 1 CT1 27 (SUTURE) IMPLANT
SUT PROLENE 2 0 BLUE (SUTURE) IMPLANT
SUT VIC AB 0 UR5 27 (SUTURE) IMPLANT
SYR 20ML LL LF (SYRINGE) ×1 IMPLANT
TOWEL OR 17X26 10 PK STRL BLUE (TOWEL DISPOSABLE) ×1 IMPLANT
TUBE GASTRO BOLUS 22FR ENFIT (TUBING) ×1 IMPLANT

## 2023-06-21 NOTE — Op Note (Signed)
   Patient: Jesus [name] (August 01, 1955, [id number])  Date of Surgery: 06/21/2023  Preoperative Diagnosis: Gastro Tube Dislodgement   Postoperative Diagnosis: Gastro Tube Dislodgement   Surgical Procedure: LAPAROSCOPIC INSERTION GASTROSTOMY TUBE: 40981 (CPT)   Operative Team Members:  Surgeons and Role:    * Coby Antrobus, Hyman Hopes, MD - Primary   Anesthesiologist: Mariann Barter, MD CRNA: Vanessa Millbrook, CRNA   Anesthesia: General   Fluids:  Total I/O In: 50 [IV Piggyback:50] Out: 0   Complications: None  Drains:  22 Fr G tube drain   Specimen: None  Disposition:  PACU - hemodynamically stable.  Plan of Care:  Continue inpatient care    Indications for Procedure: [name] is a 68 y.o. male who had a G tube dislodged after being placed two days ago.  IR were unable to replace the G tube.  I recommended laparoscopic G tube replacement.    The procedure itself as well as its risks, benefits and alternatives were discussed.  The risks discussed included but were not limited to the risk of infection, bleeding, damage to nearby structures, and recurrent G-tube dislodgment.  After a full discussion and all questions answered the patient granted consent to proceed.  Findings: Gastropexy intact, G-tube able to be replaced over wire   Description of Procedure:   Date stated above the patient taken the operating suite.  General endotracheal esthesia was induced.  Patient's abdomen is prepped draped usual sterile fashion.  We did a timeout ensuring we had the right patient, position, procedure, equipment needed for the case.  I began by making a 5 mm incision just above the umbilicus where the previous 5 mm incision had been.  I grasped and elevated the umbilical stalk.  A Veress needle was inserted into the abdomen the abdomen was inflated to 15 mmHg.  A 5 mm trocar was placed in this position.  The left upper quadrant was inspected.  The gastric pexied was intact.  The  local needle was used to reapproximate the trajectory of the G-tube.  A 5 mm trocar was inserted into the stomach following the previous trajectory of the G-tube attempting to stay in the same tract, but inevitably creating a new track in this area.  I inserted the laparoscope into this 5 mm trocar and confirmed intraluminal placement of the trocar.  A wire was then passed through the trocar and the trocar was removed.  A 22 French gastrostomy tube was then able to be slid over the wire and positioned in the stomach.  The balloon was inflated and pulled up to hold tension on the lumen of the stomach the bumper was cinched down and was again at 5 cm at the skin.  The abdomen was deflated and the skin was closed using 4-0 Monocryl and Dermabond.  All sponge needle counts were correct at the end of this case.  At the end of the case we reviewed the infection status of the case. Patient: [name] Patient Case: Urgent Infection Present At Time Of Surgery (PATOS): Gastric contamination  Ivar Drape, MD General, Bariatric, & Minimally Invasive Surgery [facility], [place]

## 2023-06-21 NOTE — TOC Initial Note (Signed)
Transition of Care [facility]) - Initial/Assessment Note    Patient Details  Name: Jesus [name] MRN: [id number] Date of Birth: 1955/06/14  Transition of Care [facility]) CM/SW Contact:    Amada Jupiter, LCSW Phone Number: 06/21/2023, 10:20 AM  Clinical Narrative:                  Met with pt to introduce CSW/ TOC role with dc planning.  Pt aware orders placed for [facility] for oversight of new peg/ feedings and agreeable/ no HH agency preference.  He confirms that he has adult children in the home and feels confident he can manage the new peg.  Currently awaiting medical readiness for dc.  Will set up tube feed supplies and HHRN follow up with anticipation may be ready for dc in 1-2 days.    Expected Discharge Plan: Home w Home Health Services Barriers to Discharge: Continued Medical Work up   Patient Goals and CMS Choice Patient states their goals for this hospitalization and ongoing recovery are:: return home          Expected Discharge Plan and Services In-house Referral: Clinical Social Work     Living arrangements for the past 2 months: Single Family Home                                      Prior Living Arrangements/Services Living arrangements for the past 2 months: Single Family Home Lives with:: Adult Children Patient language and need for interpreter reviewed:: Yes Do you feel safe going back to the place where you live?: Yes      Need for Family Participation in Patient Care: Yes (Comment) Care giver support system in place?: Yes (comment)   Criminal Activity/Legal Involvement Pertinent to Current Situation/Hospitalization: No - Comment as needed  Activities of Daily Living   ADL Screening (condition at time of admission) Independently performs ADLs?: Yes (appropriate for developmental age) Is the patient deaf or have difficulty hearing?: No Does the patient have difficulty seeing, even when wearing glasses/contacts?: No Does the patient have difficulty  concentrating, remembering, or making decisions?: No  Permission Sought/Granted Permission sought to share information with : Family Supports Permission granted to share information with : Yes, Verbal Permission Granted  Share Information with NAME: sister, Newell Coral @ [phone]           Emotional Assessment Appearance:: Appears stated age Attitude/Demeanor/Rapport: Engaged, Gracious Affect (typically observed): Accepting Orientation: : Oriented to Self, Oriented to Place, Oriented to  Time, Oriented to Situation Alcohol / Substance Use: Not Applicable Psych Involvement: No (comment)  Admission diagnosis:  Dehydration [E86.0] Dysphagia [R13.10] Dysphagia, unspecified type [R13.10] Pneumonia due to infectious organism, unspecified laterality, unspecified part of lung [J18.9] Patient Active Problem List   Diagnosis Date Noted   Protein-calorie malnutrition, severe 06/20/2023   Dysphagia 06/16/2023   CAP (community acquired pneumonia) 06/15/2023   Immunocompromised patient (HCC) 06/15/2023   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   Pain due to onychomycosis of toenail 10/25/2022   Port-A-Cath in place 09/05/2022   Head and neck cancer (HCC) 08/04/2022   Chronic pain 07/15/2021   Diabetes mellitus without complication (HCC) 07/15/2021   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   Gastroesophageal reflux disease without esophagitis 07/15/2021   History of lymphoma 07/15/2021   Lipoma of skin and subcutaneous tissue of trunk 07/15/2021   Mixed hyperlipidemia 07/15/2021  Prediabetes 07/15/2021   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   Tobacco dependence 07/15/2021   Trigger thumb of left hand 02/17/2021   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   Hoarseness 08/11/2020   Chronic pain of right knee 10/17/2018   Large cell (diffuse) non-Hodgkin's lymphoma (HCC)  05/10/2018   Bacteremia due to Escherichia coli    HTN (hypertension) 04/17/2018   RLS (restless legs syndrome) 04/17/2018   Abnormal LFTs 04/17/2018   Acute metabolic encephalopathy 04/16/2018   AKI (acute kidney injury) (HCC) 04/16/2018   Dehydration    Fever, unspecified    Sepsis (HCC)    Disorientation    Non-Hodgkin's lymphoma of skin (HCC) 04/03/2018   Hypokalemia    Hypomagnesemia    Anemia    Encounter for antineoplastic chemotherapy    At high risk of tumor lysis syndrome    Swelling of lower leg    Diffuse large B cell lymphoma (HCC) 01/15/2018   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HCC) 01/12/2018   Counseling regarding advance care planning and goals of care 01/12/2018   Bilateral leg pain 05/27/2014   PCP:  Noberto Retort, MD Pharmacy:   [id number] [address] [place]), [place] - [address] [place] [address] Phone: [phone] Fax: [phone]     Social Drivers of Health (SDOH) Social History: SDOH Screenings   Food Insecurity: No Food Insecurity (06/16/2023)  Housing: Low Risk  (06/16/2023)  Transportation Needs: No Transportation Needs (06/16/2023)  Utilities: Not At Risk (06/16/2023)  Depression (PHQ2-9): Low Risk  (11/09/2022)  Financial Resource Strain: Low Risk  (10/06/2020)  Social Connections: Moderately Isolated (06/16/2023)  Tobacco Use: Medium Risk (06/19/2023)   SDOH Interventions:     Readmission Risk Interventions    06/21/2023   10:14 AM  Readmission Risk Prevention Plan  Transportation Screening Complete  PCP or Specialist Appt within 3-5 Days Complete  HRI or Home Care Consult Complete  Social Work Consult for Recovery Care Planning/Counseling Complete  Palliative Care Screening Not Applicable  Medication Review Oceanographer) Complete

## 2023-06-21 NOTE — Procedures (Signed)
Interventional Radiology Procedure Note  Procedure:   Image guided attempt at rescue of damaged gastrostomy. Unsuccessful.  Placed surgically 2/10.   Findings: There is no tract identified to the stomach.  Tract appears sealed.  We explored the region with catheter/contrast technique to attempt to identify the previous surgical tract, however, there was only excavation of contrast and the catheter into the superficial soft tissues.    Complications: None  Recommendations:  - Routine wound care - Will need to consider other option for replacement.   Signed,  Yvone Neu. Loreta Ave, DO, ABVM, RPVI

## 2023-06-21 NOTE — Transfer of Care (Signed)
Immediate Anesthesia Transfer of Care Note  Patient: Jesus [name]  Procedure(s) Performed: LAPAROSCOPIC INSERTION GASTROSTOMY TUBE  Patient Location: PACU  Anesthesia Type:General  Level of Consciousness: awake and patient cooperative  Airway & Oxygen Therapy: Patient Spontanous Breathing and Patient connected to face mask  Post-op Assessment: Report given to RN and Post -op Vital signs reviewed and stable  Post vital signs: Reviewed and stable  Last Vitals:  Vitals Value Taken Time  BP 166/91 06/21/23 1626  Temp    Pulse 102 06/21/23 1629  Resp 16 06/21/23 1629  SpO2 95 % 06/21/23 1629  Vitals shown include unfiled device data.  Last Pain:  Vitals:   06/21/23 1511  TempSrc: Oral  PainSc: 0-No pain      Patients Stated Pain Goal: 2 (06/21/23 0929)  Complications: No notable events documented.

## 2023-06-21 NOTE — Progress Notes (Signed)
2 Days Post-Op  Subjective: CC: Seen with RN and attending.   Sounds like balloon for G-tube popped this am after something was given via balloon port of G-tube.  Multiple attempts were made to placing foley in tract at bedside but unsuccessful  He reports pain only around G-tube site, no other abdominal pain.  Before G-tube came out he was tolerating TF's without increased abdominal pain, n/v. No BM  Afebrile. Tachycardia resolved. No hypotension. WBC wnl. Hgb stable.    Objective: Vital signs in last 24 hours: Temp:  [97.7 F (36.5 C)-98.3 F (36.8 C)] 98.3 F (36.8 C) (02/12 0631) Pulse Rate:  [78-100] 95 (02/12 0631) Resp:  [14-20] 20 (02/12 0631) BP: (122-148)/(77-107) 122/81 (02/12 0631) SpO2:  [98 %-100 %] 100 % (02/12 0631) Weight:  [107.4 kg] 107.4 kg (02/12 0458) Last BM Date : 06/12/23  Intake/Output from previous day: 02/11 0701 - 02/12 0700 In: 1225.4 [P.O.:120; NG/GT:1105.4] Out: -  Intake/Output this shift: No intake/output data recorded.  PE: Gen:  Alert, NAD, pleasant Abd: Soft, no obvious distension, appropriately tender around laparoscopic incisions and G-tube. No rigidity or guarding and otherwise NT, +BS. Incisions with glue intact appears well and are without drainage, bleeding, or signs of infection. G-tube is now out - unable to place foley in tract.   Lab Results:  Recent Labs    06/20/23 0342 06/21/23 0443  WBC 5.9 7.7  HGB 7.7* 8.3*  HCT 25.7* 27.8*  PLT 470* 516*   BMET Recent Labs    06/19/23 0325 06/21/23 0443  NA 138 137  K 3.9 3.7  CL 104 102  CO2 24 26  GLUCOSE 83 165*  BUN 10 19  CREATININE 0.89 0.93  CALCIUM 9.0 9.0   PT/INR No results for input(s): "LABPROT", "INR" in the last 72 hours. CMP     Component Value Date/Time   NA 137 06/21/2023 0443   NA 143 08/21/2019 1459   K 3.7 06/21/2023 0443   CL 102 06/21/2023 0443   CO2 26 06/21/2023 0443   GLUCOSE 165 (H) 06/21/2023 0443   BUN 19 06/21/2023 0443    BUN 17 08/21/2019 1459   CREATININE 0.93 06/21/2023 0443   CREATININE 1.20 06/13/2023 1452   CREATININE 1.09 04/29/2014 1930   CALCIUM 9.0 06/21/2023 0443   PROT 7.7 06/21/2023 0443   PROT 7.0 08/21/2019 1459   ALBUMIN 3.0 (L) 06/21/2023 0443   ALBUMIN 4.7 08/21/2019 1459   AST 15 06/21/2023 0443   AST 10 (L) 06/13/2023 1452   ALT 12 06/21/2023 0443   ALT 8 06/13/2023 1452   ALKPHOS 56 06/21/2023 0443   BILITOT 0.6 06/21/2023 0443   BILITOT 0.4 06/13/2023 1452   GFRNONAA >60 06/21/2023 0443   GFRNONAA >60 06/13/2023 1452   GFRNONAA 74 04/29/2014 1930   GFRAA >60 11/28/2019 1329   GFRAA 86 04/29/2014 1930   Lipase  No results found for: "LIPASE"  Studies/Results: No results found.  Anti-infectives: Anti-infectives (From admission, onward)    Start     Dose/Rate Route Frequency Ordered Stop   06/18/23 1700  Ampicillin-Sulbactam (UNASYN) 3 g in sodium chloride 0.9 % 100 mL IVPB  Status:  Discontinued        3 g 200 mL/hr over 30 Minutes Intravenous Every 6 hours 06/18/23 1522 06/19/23 1539   06/17/23 0000  vancomycin (VANCOREADY) IVPB 1500 mg/300 mL  Status:  Discontinued        1,500 mg 150 mL/hr over 120 Minutes Intravenous  Every 24 hours 06/16/23 0137 06/16/23 0923   06/16/23 1100  vancomycin (VANCOCIN) IVPB 1000 mg/200 mL premix  Status:  Discontinued        1,000 mg 200 mL/hr over 60 Minutes Intravenous Every 12 hours 06/16/23 0923 06/18/23 1522   06/15/23 2315  vancomycin (VANCOREADY) IVPB 2000 mg/400 mL        2,000 mg 200 mL/hr over 120 Minutes Intravenous  Once 06/15/23 2303 06/16/23 0227   06/15/23 2315  ceFEPIme (MAXIPIME) 2 g in sodium chloride 0.9 % 100 mL IVPB  Status:  Discontinued        2 g 200 mL/hr over 30 Minutes Intravenous Every 8 hours 06/15/23 2303 06/18/23 1522        Assessment/Plan POD 2 s/p lap g-tube by Dr. Dossie Der on 06/19/23 for progressive dysphagia in the setting of metastatic laryngeal SCC on Keytruda followed by Dr. Pollyann Kennedy  -  G-tube came out. Unable to place foley in tract. Discussed w/ IR who will attempt to replace G-tube.   Anemia - S/p 1U PRBC 2/9. Hgb stable.   FEN - NPO VTE - SCDs, okay for chem ppx from our standpoint ID - Peri-op, none currently.     LOS: 5 days    Jacinto Halim , [facility] 06/21/2023, 8:54 AM Please see Amion for pager number during day hours 7:00am-4:30pm

## 2023-06-21 NOTE — Anesthesia Procedure Notes (Signed)
Procedure Name: Intubation Date/Time: 06/21/2023 3:46 PM  Performed by: Vanessa Dover, CRNAPre-anesthesia Checklist: Emergency Drugs available, Suction available, Patient identified and Patient being monitored Patient Re-evaluated:Patient Re-evaluated prior to induction Oxygen Delivery Method: Circle system utilized Preoxygenation: Pre-oxygenation with 100% oxygen Induction Type: IV induction Ventilation: Mask ventilation without difficulty Laryngoscope Size: Glidescope and 4 Grade View: Grade I Tube type: Oral Tube size: 7.0 mm Number of attempts: 1 Airway Equipment and Method: Video-laryngoscopy Placement Confirmation: ETT inserted through vocal cords under direct vision, positive ETCO2 and breath sounds checked- equal and bilateral Secured at: 21 cm Tube secured with: Tape Dental Injury: Teeth and Oropharynx as per pre-operative assessment

## 2023-06-21 NOTE — Progress Notes (Signed)
PROGRESS NOTE    Jesus [name]  ZOX:[id number] DOB: [birth year] DOA: 06/15/2023 PCP: Noberto Retort, MD    Brief Narrative:   Jesus [name] is a 68 y.o. 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  Has not been able to eat or drink anything over the last 3 days.  Additionally unable to control his saliva.  Follows with ENT, Dr. Pollyann Kennedy outpatient.  Was informed by ENT if his ability to swallow became worse to seek further care in the ED.  In the ED, temperature 98.1 F, HR 90, RR 15, BP 110/57, SpO2 97% on room air.  WBC 9.5, hemoglobin 8.2, platelet count 540.  Sodium 136, potassium 3.9, chloride 98, CO2 27, glucose 136, BUN 18, Cram 1.26.  AST 12, ALT 12, total bili 0.7.  High sensitive troponin 9.  Lactic acid 0.7.  COVID/influenza/RSV PCR negative.  Chest x-ray with no active cardiopulmonary disease process, rightward tracheal deviation with left peritracheal opacity corresponding to likely mass seen on prior PET/CT.  Soft tissue neck x-ray with slightly thickened appearance of the epiglottis and slightly hazy appearance of the glottic area suggesting mild edema or inflammation; possible mild narrowing of the subglottic trachea as well.  CT soft tissue neck with circumferential soft tissue abnormality surrounding the trachea posteriorly displacing the esophagus to the level of the thoracic inlet, worsened since 04/14/2023 and consistent with progression of glottic squamous cell carcinoma, acute inflammatory change of above math likely postradiation effect.  CT angiogram chest with no evidence of pulmonary embolism.  Blood cultures x 2 collected.  Patient was started empiric antibiotics with vancomycin and cefepime.  TRH consulted for admission for  further evaluation management of dysphagia likely secondary to progressive metastatic laryngeal squamous cell carcinoma.  Assessment & Plan:   Dysphagia secondary to laryngeal squamous cell carcinoma Patient follows with ENT, Dr. Pollyann Kennedy and medical oncology, Dr. Candise Che outpatient.  Continues on immunotherapy with Pembrolizumab.  CT imaging on admission with circumferential soft tissue abnormality surround the trachea posterior displacing the esophagus to the level of thoracic inlet, progressive since 04/14/2023.  Denies any shortness of breath or respiratory compromise.  General surgery was consulted and patient underwent laparoscopic insertion of gastrostomy tube by Dr. Dossie Der on 06/19/2023. -- Osmolite 1.5 at 20 mL/h; advance by 10mL q12h to gaol rate 65 mL/h; FWF q4h -- Prosource 60 mL per tube twice daily -- Flush tube with 30 mL sterile water every 4 hours; before and after medication administration -- Continue abdominal binder -- Home health orders for RN placed -- Plan DC home once G-tube is replaced, and tube feeds at goal with toleration -- Outpatient follow-up with ENT for definitive treatment of dysphagia secondary to laryngeal squamous cell carcinoma  **Unfortunately G-tube balloon popped and tube became dislodged overnight, seen by general surgery and consulted IR for assistance with replacement today.  Plan for Tube feeds on discharge: Osmolite 1.5: 2 cartons @ 7am, 12pm, 5pm, and 1 carton @ 9pm FWF 30mL before and after each bolus and free water flushes 6x daily (total of ) Provides 2489 kcal, 104 gm protein, 2254 ml free water daily *Patient will need to try and consume 1 Ensure Max daily to meet protein needs.  Ensure Max and TF regimen together would provide 2639 kcals and 134g  protein  Concern for pulmonary infiltrate, ruled out CT chest with questionable infiltrate, although patient asymptomatic and CT findings vague with motion artifact, edema.   Initially started on antibiotics and remained afebrile with no further symptoms antibiotics were discontinued. --Aspiration precautions as above  Acute renal failure secondary to prerenal azotemia Patient presenting with an elevated creatinine of 1.26 in the setting of poor oral intake due to his underlying dysphagia.  Etiology likely secondary to dehydration.  Supported with IV fluids. -- Cr 1.26>0.89>0.93 -- Started on tube feeds -- repeat BMP in the am  Hypophosphatemia Phosphorus 2.3, will replete. -- Repeat phosphorus level in a.m.  Excessive secretion -- Robinul PRN -- DME for home suction unit place, TOC consulted  Anemia Etiology likely secondary to chronic medical disease.  Anemia panel with iron 24, TIBC low at 235, ferritin 144, vitamin B12 2376. -- Hgb 8.2>>7.0>6.9>8.4>7.7>8.3 -- Transfused 1 unit PRBC on 06/18/2023 -- Check anemia panel -- Repeat CBC in the a.m.  Hx hypertension No longer taking antihypertensives/amlodipine. -- continue to monitor BP closely   DVT prophylaxis: SCDs Start: 06/16/23 0122    Code Status: Full Code Family Communication: No family present bedside this morning  Disposition Plan:  Level of care: Med-Surg Status is: Inpatient Remains inpatient appropriate because: Needs G-tube replacement after being dislodged with balloon popping overnight, IR consulted    Consultants:  General surgery Interventional radiology  Procedures:  Surgical G-tube placement, Dr. Dossie Der;  06/19/2023  Antimicrobials:  Vancomycin 2/6 - 2/9 Cefepime 2/6 - 2/9 Ampicillin 2/9 - 2/10   Subjective: Patient seen examined bedside, sitting in bedside chair.  G-tube was dislodged overnight after apparently balloon popped.  Complains of soreness surrounding his G-tube site.  Seen by general surgery, IR consulted for placement today.  Patient reports he was tolerating feeds without issue before G-tube complication.  Patient with no other complaints, concerns  or questions at this time.  Denies headache, no dizziness, no chest pain, no palpitations, no shortness of breath, no fever/chills/night sweats, no nausea/vomiting/diarrhea, no focal weakness, no fatigue, no paresthesia.  No acute events overnight per nurse staff.  Objective: Vitals:   06/20/23 2015 06/21/23 0200 06/21/23 0458 06/21/23 0631  BP: (!) 148/97 (!) 145/105  122/81  Pulse: 100 100  95  Resp: 18 17  20   Temp: 97.7 F (36.5 C) 97.7 F (36.5 C)  98.3 F (36.8 C)  TempSrc: Oral Oral  Oral  SpO2: 100% 100%  100%  Weight:   107.4 kg   Height:        Intake/Output Summary (Last 24 hours) at 06/21/2023 1057 Last data filed at 06/21/2023 0600 Gross per 24 hour  Intake 1225.42 ml  Output --  Net 1225.42 ml   Filed Weights   06/16/23 0810 06/21/23 0458  Weight: 108.4 kg 107.4 kg    Examination:  Physical Exam: GEN: NAD, alert and oriented x 3, wd/wn HEENT: NCAT, PERRL, EOMI, sclera clear, MMM PULM: CTAB w/o wheezes/crackles, normal respiratory effort, on room air CV: RRR w/o M/G/R GI: abd soft, NTND, NABS, no R/G/M MSK: no peripheral edema, moves all extremities independently NEURO: No focal neurological deficits PSYCH: normal mood/affect Integumentary: No concerning ashes/lesions/wounds noted on exposed skin surfaces    Data Reviewed: I have personally reviewed following labs and imaging studies  CBC: Recent Labs  Lab 06/16/23 0422 06/17/23 0420 06/18/23 0338 06/19/23 0325 06/20/23 0342 06/21/23 0443  WBC 7.4 6.2 6.2 7.2 5.9 7.7  NEUTROABS 5.0  --   --   --   --   --  HGB 7.2* 7.0* 6.9* 8.4* 7.7* 8.3*  HCT 23.8* 23.6* 23.2* 27.6* 25.7* 27.8*  MCV 85.6 85.5 85.6 84.7 84.5 85.3  PLT 469* 489* 478* 494* 470* 516*   Basic Metabolic Panel: Recent Labs  Lab 06/16/23 0422 06/17/23 0420 06/18/23 0338 06/19/23 0325 06/20/23 1845 06/21/23 0443  NA 135 137 139 138  --  137  K 3.8 3.8 4.1 3.9  --  3.7  CL 101 105 106 104  --  102  CO2 24 23 23 24   --  26   GLUCOSE 119* 108* 100* 83  --  165*  BUN 16 14 11 10   --  19  CREATININE 0.98 1.00 1.03 0.89  --  0.93  CALCIUM 8.6* 8.7* 8.8* 9.0  --  9.0  MG  --   --   --   --  1.7 1.9  PHOS  --   --   --   --  2.4* 2.3*   GFR: Estimated Creatinine Clearance: 97.6 mL/min (by C-G formula based on SCr of 0.93 mg/dL). Liver Function Tests: Recent Labs  Lab 06/15/23 1916 06/21/23 0443  AST 12* 15  ALT 12 12  ALKPHOS 76 56  BILITOT 0.7 0.6  PROT 8.7* 7.7  ALBUMIN 3.5 3.0*   No results for input(s): "LIPASE", "AMYLASE" in the last 168 hours. No results for input(s): "AMMONIA" in the last 168 hours. Coagulation Profile: No results for input(s): "INR", "PROTIME" in the last 168 hours. Cardiac Enzymes: No results for input(s): "CKTOTAL", "CKMB", "CKMBINDEX", "TROPONINI" in the last 168 hours. BNP (last 3 results) No results for input(s): "PROBNP" in the last 8760 hours. HbA1C: No results for input(s): "HGBA1C" in the last 72 hours. CBG: Recent Labs  Lab 06/20/23 1643 06/20/23 2012 06/21/23 0044 06/21/23 0505 06/21/23 0749  GLUCAP 118* 125* 129* 149* 160*   Lipid Profile: No results for input(s): "CHOL", "HDL", "LDLCALC", "TRIG", "CHOLHDL", "LDLDIRECT" in the last 72 hours. Thyroid Function Tests: No results for input(s): "TSH", "T4TOTAL", "FREET4", "T3FREE", "THYROIDAB" in the last 72 hours. Anemia Panel: Recent Labs    06/21/23 0443  VITAMINB12 2,376*  FERRITIN 144  TIBC 235*  IRON 24*  RETICCTPCT 1.6   Sepsis Labs: Recent Labs  Lab 06/15/23 2034  LATICACIDVEN 0.7    Recent Results (from the past 240 hours)  Resp panel by RT-PCR (RSV, Flu A&B, Covid) Anterior Nasal Swab     Status: None   Collection Time: 06/15/23  7:16 PM   Specimen: Anterior Nasal Swab  Result Value Ref Range Status   SARS Coronavirus 2 by RT PCR NEGATIVE NEGATIVE Final    Comment: (NOTE) SARS-CoV-2 target nucleic acids are NOT DETECTED.  The SARS-CoV-2 RNA is generally detectable in upper  respiratory specimens during the acute phase of infection. The lowest concentration of SARS-CoV-2 viral copies this assay can detect is 138 copies/mL. A negative result does not preclude SARS-Cov-2 infection and should not be used as the sole basis for treatment or other patient management decisions. A negative result may occur with  improper specimen collection/handling, submission of specimen other than nasopharyngeal swab, presence of viral mutation(s) within the areas targeted by this assay, and inadequate number of viral copies(<138 copies/mL). A negative result must be combined with clinical observations, patient history, and epidemiological information. The expected result is Negative.  Fact Sheet for Patients:  BloggerCourse.com  Fact Sheet for Healthcare Providers:  SeriousBroker.it  This test is no t yet approved or cleared by the [place] and  has been authorized for detection and/or diagnosis of SARS-CoV-2 by FDA under an Emergency Use Authorization (EUA). This EUA will remain  in effect (meaning this test can be used) for the duration of the COVID-19 declaration under Section 564(b)(1) of the Act, 21 U.S.C.section 360bbb-3(b)(1), unless the authorization is terminated  or revoked sooner.       Influenza A by PCR NEGATIVE NEGATIVE Final   Influenza B by PCR NEGATIVE NEGATIVE Final    Comment: (NOTE) The Xpert Xpress SARS-CoV-2/FLU/RSV plus assay is intended as an aid in the diagnosis of influenza from Nasopharyngeal swab specimens and should not be used as a sole basis for treatment. Nasal washings and aspirates are unacceptable for Xpert Xpress SARS-CoV-2/FLU/RSV testing.  Fact Sheet for Patients: BloggerCourse.com  Fact Sheet for Healthcare Providers: SeriousBroker.it  This test is not yet approved or cleared by the [place] FDA and has been  authorized for detection and/or diagnosis of SARS-CoV-2 by FDA under an Emergency Use Authorization (EUA). This EUA will remain in effect (meaning this test can be used) for the duration of the COVID-19 declaration under Section 564(b)(1) of the Act, 21 U.S.C. section 360bbb-3(b)(1), unless the authorization is terminated or revoked.     Resp Syncytial Virus by PCR NEGATIVE NEGATIVE Final    Comment: (NOTE) Fact Sheet for Patients: BloggerCourse.com  Fact Sheet for Healthcare Providers: SeriousBroker.it  This test is not yet approved or cleared by the [place] FDA and has been authorized for detection and/or diagnosis of SARS-CoV-2 by FDA under an Emergency Use Authorization (EUA). This EUA will remain in effect (meaning this test can be used) for the duration of the COVID-19 declaration under Section 564(b)(1) of the Act, 21 U.S.C. section 360bbb-3(b)(1), unless the authorization is terminated or revoked.  Performed at [facility], [address] [place], [place] [address]   MRSA Next Gen by PCR, Nasal     Status: None   Collection Time: 06/16/23 10:03 AM   Specimen: Nasal Mucosa; Nasal Swab  Result Value Ref Range Status   MRSA by PCR Next Gen NOT DETECTED NOT DETECTED Final    Comment: (NOTE) The GeneXpert MRSA Assay (FDA approved for NASAL specimens only), is one component of a comprehensive MRSA colonization surveillance program. It is not intended to diagnose MRSA infection nor to guide or monitor treatment for MRSA infections. Test performance is not FDA approved in patients less than 49 years old. Performed at [facility], [address] [place], [place] [address]   Surgical pcr screen     Status: None   Collection Time: 06/18/23  5:15 PM   Specimen: Nasal Mucosa; Nasal Swab  Result Value Ref Range Status   MRSA, PCR NEGATIVE NEGATIVE Final   Staphylococcus aureus  NEGATIVE NEGATIVE Final    Comment: (NOTE) The Xpert SA Assay (FDA approved for NASAL specimens in patients 53 years of age and older), is one component of a comprehensive surveillance program. It is not intended to diagnose infection nor to guide or monitor treatment. Performed at [facility], [address], [place], [place] [address]          Radiology Studies: No results found.      Scheduled Meds:  sodium chloride   Intravenous Once   Chlorhexidine Gluconate Cloth  6 each Topical Daily   feeding supplement (PROSource TF20)  60 mL Per Tube BID   free water  175 mL Per Tube Q4H   sodium chloride flush  10-40 mL Intracatheter Q12H   thiamine  100 mg Per Tube Daily   Continuous Infusions:  feeding supplement (OSMOLITE 1.5 CAL) 1,000 mL (06/20/23 1255)   sodium PHOSPHATE IVPB (in mmol)       LOS: 5 days    Time spent: 54 minutes spent on chart review, discussion with nursing staff, consultants, updating family and interview/physical exam; more than 50% of that time was spent in counseling and/or coordination of care.    Alvira Philips [place], DO [facility] Available via Epic secure chat 7am-7pm After these hours, please refer to coverage provider listed on [email or web address] 06/21/2023, 10:57 AM

## 2023-06-21 NOTE — Anesthesia Preprocedure Evaluation (Addendum)
Anesthesia Evaluation  Patient identified by MRN, date of birth, ID band Patient awake    Reviewed: Allergy & Precautions, NPO status , Patient's Chart, lab work & pertinent test results  Airway Mallampati: II  TM Distance: >3 FB Neck ROM: Full    Dental  (+) Teeth Intact, Dental Advisory Given, Caps,    Pulmonary COPD, former smoker   Pulmonary exam normal breath sounds clear to auscultation       Cardiovascular hypertension, Pt. on medications + Peripheral Vascular Disease  Normal cardiovascular exam Rhythm:Regular Rate:Normal     Neuro/Psych negative neurological ROS  negative psych ROS   GI/Hepatic ,GERD  ,,(+)     substance abuse  cocaine use and marijuana uselaparoscopic insertion of gastrostomy tube by Dr. Dossie Der on 06/19/2023 Unfortunately G-tube balloon popped and tube became dislodged overnight   Endo/Other  diabetes, Type 2  Obesity   Renal/GU Renal disease     Musculoskeletal  (+) Arthritis ,    Abdominal   Peds  Hematology  (+) Blood dyscrasia, anemia Non- Hodgkins lymphoma IV s/p chemo/radiation   Anesthesia Other Findings Day of surgery medications reviewed with the patient.  Metastatic squamous cell carcinoma of the larynx. Symptoms include dysphagia and SOB.  Reproductive/Obstetrics                             Anesthesia Physical Anesthesia Plan  ASA: 3  Anesthesia Plan: General   Post-op Pain Management:    Induction: Intravenous and Rapid sequence  PONV Risk Score and Plan: 2 and Dexamethasone and Ondansetron  Airway Management Planned: Video Laryngoscope Planned and Oral ETT  Additional Equipment:   Intra-op Plan:   Post-operative Plan: Extubation in OR  Informed Consent: I have reviewed the patients History and Physical, chart, labs and discussed the procedure including the risks, benefits and alternatives for the proposed anesthesia with the  patient or authorized representative who has indicated his/her understanding and acceptance.     Dental advisory given  Plan Discussed with: CRNA  Anesthesia Plan Comments:         Anesthesia Quick Evaluation

## 2023-06-21 NOTE — Progress Notes (Signed)
Unfortunately, gastrostomy tube unable to be replaced in interventional radiology.  I recommend return to the operating room for laparoscopic gastrostomy tube placement.  We discussed the procedure, its risks, benefits and alternatives and the patient granted consent to proceed.  Will proceed emergently as there may be communication between the stomach and peritoneum.  Quentin Ore, MD General, Bariatric and Minimally Invasive Surgery [facility] Surgery - [facility]

## 2023-06-21 NOTE — Plan of Care (Signed)

## 2023-06-22 ENCOUNTER — Encounter (HOSPITAL_COMMUNITY): Payer: Self-pay | Admitting: Surgery

## 2023-06-22 DIAGNOSIS — C329 Malignant neoplasm of larynx, unspecified: Secondary | ICD-10-CM | POA: Diagnosis not present

## 2023-06-22 DIAGNOSIS — R1310 Dysphagia, unspecified: Secondary | ICD-10-CM | POA: Diagnosis not present

## 2023-06-22 LAB — CBC
HCT: 27.6 % — ABNORMAL LOW (ref 39.0–52.0)
Hemoglobin: 8.1 g/dL — ABNORMAL LOW (ref 13.0–17.0)
MCH: 25.4 pg — ABNORMAL LOW (ref 26.0–34.0)
MCHC: 29.3 g/dL — ABNORMAL LOW (ref 30.0–36.0)
MCV: 86.5 fL (ref 80.0–100.0)
Platelets: 458 10*3/uL — ABNORMAL HIGH (ref 150–400)
RBC: 3.19 MIL/uL — ABNORMAL LOW (ref 4.22–5.81)
RDW: 13.8 % (ref 11.5–15.5)
WBC: 5.9 10*3/uL (ref 4.0–10.5)
nRBC: 0 % (ref 0.0–0.2)

## 2023-06-22 LAB — MAGNESIUM: Magnesium: 1.7 mg/dL (ref 1.7–2.4)

## 2023-06-22 LAB — GLUCOSE, CAPILLARY
Glucose-Capillary: 156 mg/dL — ABNORMAL HIGH (ref 70–99)
Glucose-Capillary: 144 mg/dL — ABNORMAL HIGH (ref 70–99)
Glucose-Capillary: 109 mg/dL — ABNORMAL HIGH (ref 70–99)
Glucose-Capillary: 132 mg/dL — ABNORMAL HIGH (ref 70–99)
Glucose-Capillary: 153 mg/dL — ABNORMAL HIGH (ref 70–99)

## 2023-06-22 LAB — COMPREHENSIVE METABOLIC PANEL
ALT: 11 U/L (ref 0–44)
AST: 15 U/L (ref 15–41)
Albumin: 2.8 g/dL — ABNORMAL LOW (ref 3.5–5.0)
Alkaline Phosphatase: 54 U/L (ref 38–126)
Anion gap: 8 (ref 5–15)
BUN: 16 mg/dL (ref 8–23)
CO2: 28 mmol/L (ref 22–32)
Calcium: 8.8 mg/dL — ABNORMAL LOW (ref 8.9–10.3)
Chloride: 103 mmol/L (ref 98–111)
Creatinine, Ser: 0.92 mg/dL (ref 0.61–1.24)
GFR, Estimated: >60 mL/min (ref >60)
Glucose, Bld: 177 mg/dL — ABNORMAL HIGH (ref 70–99)
Potassium: 4.1 mmol/L (ref 3.5–5.1)
Sodium: 139 mmol/L (ref 135–145)
Total Bilirubin: 0.4 mg/dL (ref 0.0–1.2)
Total Protein: 7.4 g/dL (ref 6.5–8.1)

## 2023-06-22 LAB — PHOSPHORUS: Phosphorus: 3.4 mg/dL (ref 2.5–4.6)

## 2023-06-22 NOTE — Plan of Care (Signed)
Problem: Coping: Goal: Level of anxiety will decrease Outcome: Progressing   Problem: Pain Managment: Goal: General experience of comfort will improve and/or be controlled Outcome: Progressing   Problem: Safety: Goal: Ability to remain free from injury will improve Outcome: Progressing

## 2023-06-22 NOTE — Progress Notes (Signed)
1 Day Post-Op  Subjective: CC: Tolerating TF's without n/v. No BM. Pain around incisions and g-tube are well controlled. Mobilizing.   Afebrile. HR 104. No hypotension. WBC wnl. Hgb stable.    Objective: Vital signs in last 24 hours: Temp:  [98 F (36.7 C)-99 F (37.2 C)] 98.7 F (37.1 C) (02/13 0446) Pulse Rate:  [91-104] 104 (02/13 0446) Resp:  [12-20] 17 (02/13 0446) BP: (102-169)/(74-95) 102/74 (02/13 0446) SpO2:  [93 %-100 %] 98 % (02/13 0446) Weight:  [107.4 kg] 107.4 kg (02/13 0717) Last BM Date : 06/12/23  Intake/Output from previous day: 02/12 0701 - 02/13 0700 In: 1218.8 [I.V.:580; NG/GT:460; IV Piggyback:178.8] Out: 0  Intake/Output this shift: Total I/O In: 240 [P.O.:240] Out: 0   PE: Gen:  Alert, NAD, pleasant Abd: Soft, no obvious distension, appropriately tender around laparoscopic incisions and G-tube. No rigidity or guarding and otherwise NT, +BS. Incisions with glue intact appears well and are without drainage, bleeding, or signs of infection. G-tube in place with TF's running and no obvious complications.   Lab Results:  Recent Labs    06/21/23 0443 06/22/23 0353  WBC 7.7 5.9  HGB 8.3* 8.1*  HCT 27.8* 27.6*  PLT 516* 458*   BMET Recent Labs    06/21/23 0443 06/22/23 0353  NA 137 139  K 3.7 4.1  CL 102 103  CO2 26 28  GLUCOSE 165* 177*  BUN 19 16  CREATININE 0.93 0.92  CALCIUM 9.0 8.8*   PT/INR No results for input(s): "LABPROT", "INR" in the last 72 hours. CMP     Component Value Date/Time   NA 139 06/22/2023 0353   NA 143 08/21/2019 1459   K 4.1 06/22/2023 0353   CL 103 06/22/2023 0353   CO2 28 06/22/2023 0353   GLUCOSE 177 (H) 06/22/2023 0353   BUN 16 06/22/2023 0353   BUN 17 08/21/2019 1459   CREATININE 0.92 06/22/2023 0353   CREATININE 1.20 06/13/2023 1452   CREATININE 1.09 04/29/2014 1930   CALCIUM 8.8 (L) 06/22/2023 0353   PROT 7.4 06/22/2023 0353   PROT 7.0 08/21/2019 1459   ALBUMIN 2.8 (L) 06/22/2023 0353    ALBUMIN 4.7 08/21/2019 1459   AST 15 06/22/2023 0353   AST 10 (L) 06/13/2023 1452   ALT 11 06/22/2023 0353   ALT 8 06/13/2023 1452   ALKPHOS 54 06/22/2023 0353   BILITOT 0.4 06/22/2023 0353   BILITOT 0.4 06/13/2023 1452   GFRNONAA >60 06/22/2023 0353   GFRNONAA >60 06/13/2023 1452   GFRNONAA 74 04/29/2014 1930   GFRAA >60 11/28/2019 1329   GFRAA 86 04/29/2014 1930   Lipase  No results found for: "LIPASE"  Studies/Results: No results found.  Anti-infectives: Anti-infectives (From admission, onward)    Start     Dose/Rate Route Frequency Ordered Stop   06/18/23 1700  Ampicillin-Sulbactam (UNASYN) 3 g in sodium chloride 0.9 % 100 mL IVPB  Status:  Discontinued        3 g 200 mL/hr over 30 Minutes Intravenous Every 6 hours 06/18/23 1522 06/19/23 1539   06/17/23 0000  vancomycin (VANCOREADY) IVPB 1500 mg/300 mL  Status:  Discontinued        1,500 mg 150 mL/hr over 120 Minutes Intravenous Every 24 hours 06/16/23 0137 06/16/23 0923   06/16/23 1100  vancomycin (VANCOCIN) IVPB 1000 mg/200 mL premix  Status:  Discontinued        1,000 mg 200 mL/hr over 60 Minutes Intravenous Every 12 hours 06/16/23 0923 06/18/23  1522   06/15/23 2315  vancomycin (VANCOREADY) IVPB 2000 mg/400 mL        2,000 mg 200 mL/hr over 120 Minutes Intravenous  Once 06/15/23 2303 06/16/23 0227   06/15/23 2315  ceFEPIme (MAXIPIME) 2 g in sodium chloride 0.9 % 100 mL IVPB  Status:  Discontinued        2 g 200 mL/hr over 30 Minutes Intravenous Every 8 hours 06/15/23 2303 06/18/23 1522        Assessment/Plan POD 2 s/p lap g-tube by Dr. Dossie Der on 06/19/23 for progressive dysphagia in the setting of metastatic laryngeal SCC on Keytruda followed by Dr. Pollyann Kennedy POD 1 s/p lap g-tube insertion by Dr. Dossie Der on 06/21/23 after G-tube became dislodged.  - Okay to use G-tube (meds and TF's). Solution pain medication ordered. Defer TF's to primary team. Recommend HH to assist w/ TF's - talked to [facility] about this.  Please flush tube with 30 ml (20 ml if fluid restricted) sterile water every 4 hours, before and after medication administration, and when continuous feeding are interrupted. Please keep abdominal binder in place.  - Defer PO diet to primary. He had progressive dysphagia in the setting of above - Mobilize, pulm toilet - Will send pain medication to his pharmacy and arrange f/u - Please call back with any questions or concerns.    FEN - CLD, okay for TF's. Okay for liquid colace or miralax per tube.  VTE - SCDs, okay for chem ppx from our standpoint ID - Peri-op, none currently.     LOS: 6 days    Jacinto Halim , [facility] Surgery 06/22/2023, 11:18 AM Please see Amion for pager number during day hours 7:00am-4:30pm

## 2023-06-22 NOTE — Progress Notes (Signed)
PROGRESS NOTE    Jesus [name]  ZOX:[id number] DOB: Feb 05, 1956 DOA: 06/15/2023 PCP: Noberto Retort, MD    Brief Narrative:   Jesus [name] is a 68 y.o. 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  Has not been able to eat or drink anything over the last 3 days.  Additionally unable to control his saliva.  Follows with ENT, Dr. Pollyann Kennedy outpatient.  Was informed by ENT if his ability to swallow became worse to seek further care in the ED.  In the ED, temperature 98.1 F, HR 90, RR 15, BP 110/57, SpO2 97% on room air.  WBC 9.5, hemoglobin 8.2, platelet count 540.  Sodium 136, potassium 3.9, chloride 98, CO2 27, glucose 136, BUN 18, Cram 1.26.  AST 12, ALT 12, total bili 0.7.  High sensitive troponin 9.  Lactic acid 0.7.  COVID/influenza/RSV PCR negative.  Chest x-ray with no active cardiopulmonary disease process, rightward tracheal deviation with left peritracheal opacity corresponding to likely mass seen on prior PET/CT.  Soft tissue neck x-ray with slightly thickened appearance of the epiglottis and slightly hazy appearance of the glottic area suggesting mild edema or inflammation; possible mild narrowing of the subglottic trachea as well.  CT soft tissue neck with circumferential soft tissue abnormality surrounding the trachea posteriorly displacing the esophagus to the level of the thoracic inlet, worsened since 04/14/2023 and consistent with progression of glottic squamous cell carcinoma, acute inflammatory change of above math likely postradiation effect.  CT angiogram chest with no evidence of pulmonary embolism.  Blood cultures x 2 collected.  Patient was started empiric antibiotics with vancomycin and cefepime.  TRH consulted for admission for  further evaluation management of dysphagia likely secondary to progressive metastatic laryngeal squamous cell carcinoma.  Assessment & Plan:   Dysphagia secondary to laryngeal squamous cell carcinoma Patient follows with ENT, Dr. Pollyann Kennedy and medical oncology, Dr. Candise Che outpatient.  Continues on immunotherapy with Pembrolizumab.  CT imaging on admission with circumferential soft tissue abnormality surround the trachea posterior displacing the esophagus to the level of thoracic inlet, progressive since 04/14/2023.  Denies any shortness of breath or respiratory compromise.  General surgery was consulted and patient underwent laparoscopic insertion of gastrostomy tube by Dr. Dossie Der on 06/19/2023.  Patient's G-tube became dislodged on 06/21/2023 and underwent repeat laparoscopic insertion of G-tube on 06/21/2023. -- Osmolite 1.5 at 20 mL/h; advance by 10mL q6h to gaol rate 65 mL/h; FWF q4h -- Prosource 60 mL per tube twice daily -- Flush tube with 30 mL sterile water every 4 hours; before and after medication administration -- Continue abdominal binder -- Home health orders for RN placed -- Plan DC home once tube feeds at goal with toleration -- Outpatient follow-up with ENT for definitive treatment of dysphagia secondary to laryngeal squamous cell carcinoma  Plan for Tube feeds on discharge: Osmolite 1.5: 2 cartons @ 7am, 12pm, 5pm, and 1 carton @ 9pm FWF 30mL before and after each bolus and free water flushes 6x daily (total of ) Provides 2489 kcal, 104 gm protein, 2254 ml free water daily *Patient will need to try and consume 1 Ensure Max daily to meet protein needs.  Ensure Max and TF regimen together would provide 2639 kcals and 134g protein  Concern for pulmonary infiltrate, ruled out CT chest  with questionable infiltrate, although patient asymptomatic and CT findings vague with motion artifact, edema.  Initially started on antibiotics and remained afebrile with no further  symptoms antibiotics were discontinued. --Aspiration precautions as above  Acute renal failure secondary to prerenal azotemia: Resolved Patient presenting with an elevated creatinine of 1.26 in the setting of poor oral intake due to his underlying dysphagia.  Etiology likely secondary to dehydration.  Supported with IV fluids. -- Cr 1.26>0.89>0.93 -- Started on tube feeds  Hypophosphatemia Repleted, phosphorus 3.4 this morning. -- Repeat phosphorus level in a.m.  Excessive secretion -- Robinul PRN -- DME for home suction unit place, TOC consulted  Anemia Etiology likely secondary to chronic medical disease.  Anemia panel with iron 24, TIBC low at 235, ferritin 144, vitamin B12 2376. -- Hgb 8.2>>7.0>6.9>8.4>7.7>8.3>8.1 -- Transfused 1 unit PRBC on 06/18/2023  Hx hypertension No longer taking antihypertensives/amlodipine. -- continue to monitor BP closely   DVT prophylaxis: SCDs Start: 06/16/23 0122    Code Status: Full Code Family Communication: No family present bedside this morning  Disposition Plan:  Level of care: Med-Surg Status is: Inpatient Remains inpatient appropriate because: Uptitrating tube feeds, once at goal plan discharge home anticipate Saturday    Consultants:  General surgery Interventional radiology  Procedures:  Surgical G-tube placement, Dr. Dossie Der;  06/19/2023 Surgical G-tube replacement, Dr. Dossie Der; 06/21/2023  Antimicrobials:  Vancomycin 2/6 - 2/9 Cefepime 2/6 - 2/9 Ampicillin 2/9 - 2/10   Subjective: Patient seen examined bedside, sitting in bedside chair.  G-tube replaced yesterday by general surgery.  Restarted on tube feeds, uptitrating to goal.  RN present at bedside.  Patient with no specific complaints, concerns or questions at this time.   Denies headache, no dizziness, no chest pain, no palpitations, no shortness of breath, no fever/chills/night sweats, no nausea/vomiting/diarrhea, no focal weakness, no fatigue, no  paresthesia.  No acute events overnight per nurse staff.  Objective: Vitals:   06/21/23 1737 06/21/23 2258 06/22/23 0446 06/22/23 0717  BP:  (!) 135/95 102/74   Pulse: 95 (!) 104 (!) 104   Resp:  17 17   Temp:  98.4 F (36.9 C) 98.7 F (37.1 C)   TempSrc:  Oral Oral   SpO2:  95% 98%   Weight:    107.4 kg  Height:        Intake/Output Summary (Last 24 hours) at 06/22/2023 1120 Last data filed at 06/22/2023 0930 Gross per 24 hour  Intake 1458.81 ml  Output 0 ml  Net 1458.81 ml   Filed Weights   06/21/23 0458 06/21/23 1511 06/22/23 0717  Weight: 107.4 kg 107.4 kg 107.4 kg    Examination:  Physical Exam: GEN: NAD, alert and oriented x 3, wd/wn HEENT: NCAT, PERRL, EOMI, sclera clear, MMM PULM: CTAB w/o wheezes/crackles, normal respiratory effort, on room air CV: RRR w/o M/G/R GI: abd soft, NTND, NABS, no R/G/M; G-tube noted MSK: no peripheral edema, moves all extremities independently NEURO: No focal neurological deficits PSYCH: normal mood/affect Integumentary: No concerning ashes/lesions/wounds noted on exposed skin surfaces    Data Reviewed: I have personally reviewed following labs and imaging studies  CBC: Recent Labs  Lab 06/16/23 0422 06/17/23 0420 06/18/23 0338 06/19/23 0325 06/20/23 0342 06/21/23 0443 06/22/23 0353  WBC 7.4   < > 6.2 7.2 5.9 7.7 5.9  NEUTROABS 5.0  --   --   --   --   --   --   HGB 7.2*   < > 6.9* 8.4* 7.7* 8.3* 8.1*  HCT  23.8*   < > 23.2* 27.6* 25.7* 27.8* 27.6*  MCV 85.6   < > 85.6 84.7 84.5 85.3 86.5  PLT 469*   < > 478* 494* 470* 516* 458*   < > = values in this interval not displayed.   Basic Metabolic Panel: Recent Labs  Lab 06/17/23 0420 06/18/23 0338 06/19/23 0325 06/20/23 1845 06/21/23 0443 06/21/23 1850 06/22/23 0353  NA 137 139 138  --  137  --  139  K 3.8 4.1 3.9  --  3.7  --  4.1  CL 105 106 104  --  102  --  103  CO2 23 23 24   --  26  --  28  GLUCOSE 108* 100* 83  --  165*  --  177*  BUN 14 11 10   --  19   --  16  CREATININE 1.00 1.03 0.89  --  0.93  --  0.92  CALCIUM 8.7* 8.8* 9.0  --  9.0  --  8.8*  MG  --   --   --  1.7 1.9 1.5* 1.7  PHOS  --   --   --  2.4* 2.3* 3.2 3.4   GFR: Estimated Creatinine Clearance: 98.6 mL/min (by C-G formula based on SCr of 0.92 mg/dL). Liver Function Tests: Recent Labs  Lab 06/15/23 1916 06/21/23 0443 06/22/23 0353  AST 12* 15 15  ALT 12 12 11   ALKPHOS 76 56 54  BILITOT 0.7 0.6 0.4  PROT 8.7* 7.7 7.4  ALBUMIN 3.5 3.0* 2.8*   No results for input(s): "LIPASE", "AMYLASE" in the last 168 hours. No results for input(s): "AMMONIA" in the last 168 hours. Coagulation Profile: No results for input(s): "INR", "PROTIME" in the last 168 hours. Cardiac Enzymes: No results for input(s): "CKTOTAL", "CKMB", "CKMBINDEX", "TROPONINI" in the last 168 hours. BNP (last 3 results) No results for input(s): "PROBNP" in the last 8760 hours. HbA1C: No results for input(s): "HGBA1C" in the last 72 hours. CBG: Recent Labs  Lab 06/21/23 1216 06/21/23 2002 06/21/23 2334 06/22/23 0345 06/22/23 0753  GLUCAP 95 146* 177* 156* 144*   Lipid Profile: No results for input(s): "CHOL", "HDL", "LDLCALC", "TRIG", "CHOLHDL", "LDLDIRECT" in the last 72 hours. Thyroid Function Tests: No results for input(s): "TSH", "T4TOTAL", "FREET4", "T3FREE", "THYROIDAB" in the last 72 hours. Anemia Panel: Recent Labs    06/21/23 0443  VITAMINB12 2,376*  FOLATE 28.6  FERRITIN 144  TIBC 235*  IRON 24*  RETICCTPCT 1.6   Sepsis Labs: Recent Labs  Lab 06/15/23 2034  LATICACIDVEN 0.7    Recent Results (from the past 240 hours)  Resp panel by RT-PCR (RSV, Flu A&B, Covid) Anterior Nasal Swab     Status: None   Collection Time: 06/15/23  7:16 PM   Specimen: Anterior Nasal Swab  Result Value Ref Range Status   SARS Coronavirus 2 by RT PCR NEGATIVE NEGATIVE Final    Comment: (NOTE) SARS-CoV-2 target nucleic acids are NOT DETECTED.  The SARS-CoV-2 RNA is generally detectable in  upper respiratory specimens during the acute phase of infection. The lowest concentration of SARS-CoV-2 viral copies this assay can detect is 138 copies/mL. A negative result does not preclude SARS-Cov-2 infection and should not be used as the sole basis for treatment or other patient management decisions. A negative result may occur with  improper specimen collection/handling, submission of specimen other than nasopharyngeal swab, presence of viral mutation(s) within the areas targeted by this assay, and inadequate number of viral copies(<138 copies/mL). A  negative result must be combined with clinical observations, patient history, and epidemiological information. The expected result is Negative.  Fact Sheet for Patients:  BloggerCourse.com  Fact Sheet for Healthcare Providers:  SeriousBroker.it  This test is no t yet approved or cleared by the [place] FDA and  has been authorized for detection and/or diagnosis of SARS-CoV-2 by FDA under an Emergency Use Authorization (EUA). This EUA will remain  in effect (meaning this test can be used) for the duration of the COVID-19 declaration under Section 564(b)(1) of the Act, 21 U.S.C.section 360bbb-3(b)(1), unless the authorization is terminated  or revoked sooner.       Influenza A by PCR NEGATIVE NEGATIVE Final   Influenza B by PCR NEGATIVE NEGATIVE Final    Comment: (NOTE) The Xpert Xpress SARS-CoV-2/FLU/RSV plus assay is intended as an aid in the diagnosis of influenza from Nasopharyngeal swab specimens and should not be used as a sole basis for treatment. Nasal washings and aspirates are unacceptable for Xpert Xpress SARS-CoV-2/FLU/RSV testing.  Fact Sheet for Patients: BloggerCourse.com  Fact Sheet for Healthcare Providers: SeriousBroker.it  This test is not yet approved or cleared by the [place] FDA and has been  authorized for detection and/or diagnosis of SARS-CoV-2 by FDA under an Emergency Use Authorization (EUA). This EUA will remain in effect (meaning this test can be used) for the duration of the COVID-19 declaration under Section 564(b)(1) of the Act, 21 U.S.C. section 360bbb-3(b)(1), unless the authorization is terminated or revoked.     Resp Syncytial Virus by PCR NEGATIVE NEGATIVE Final    Comment: (NOTE) Fact Sheet for Patients: BloggerCourse.com  Fact Sheet for Healthcare Providers: SeriousBroker.it  This test is not yet approved or cleared by the [place] FDA and has been authorized for detection and/or diagnosis of SARS-CoV-2 by FDA under an Emergency Use Authorization (EUA). This EUA will remain in effect (meaning this test can be used) for the duration of the COVID-19 declaration under Section 564(b)(1) of the Act, 21 U.S.C. section 360bbb-3(b)(1), unless the authorization is terminated or revoked.  Performed at [facility], [address] [place], [place] [address]   MRSA Next Gen by PCR, Nasal     Status: None   Collection Time: 06/16/23 10:03 AM   Specimen: Nasal Mucosa; Nasal Swab  Result Value Ref Range Status   MRSA by PCR Next Gen NOT DETECTED NOT DETECTED Final    Comment: (NOTE) The GeneXpert MRSA Assay (FDA approved for NASAL specimens only), is one component of a comprehensive MRSA colonization surveillance program. It is not intended to diagnose MRSA infection nor to guide or monitor treatment for MRSA infections. Test performance is not FDA approved in patients less than 48 years old. Performed at [facility], [address], [place], [place] [address]   Surgical pcr screen     Status: None   Collection Time: 06/18/23  5:15 PM   Specimen: Nasal Mucosa; Nasal Swab  Result Value Ref Range Status   MRSA, PCR NEGATIVE NEGATIVE Final   Staphylococcus aureus  NEGATIVE NEGATIVE Final    Comment: (NOTE) The Xpert SA Assay (FDA approved for NASAL specimens in patients 14 years of age and older), is one component of a comprehensive surveillance program. It is not intended to diagnose infection nor to guide or monitor treatment. Performed at [facility], [address] [place], [place] [address]          Radiology Studies: No results found.  Scheduled Meds:  sodium chloride   Intravenous Once   Chlorhexidine Gluconate Cloth  6 each Topical Daily   feeding supplement (PROSource TF20)  60 mL Per Tube BID   free water  175 mL Per Tube Q4H   sodium chloride flush  10-40 mL Intracatheter Q12H   thiamine  100 mg Per Tube Daily   Continuous Infusions:  feeding supplement (OSMOLITE 1.5 CAL) 30 mL/hr at 06/22/23 0930     LOS: 6 days    Time spent: 54 minutes spent on chart review, discussion with nursing staff, consultants, updating family and interview/physical exam; more than 50% of that time was spent in counseling and/or coordination of care.    Alvira Philips [place], DO [facility] Available via Epic secure chat 7am-7pm After these hours, please refer to coverage provider listed on [email or web address] 06/22/2023, 11:20 AM

## 2023-06-22 NOTE — Anesthesia Postprocedure Evaluation (Signed)
Anesthesia Post Note  Patient: Jesus [name]  Procedure(s) Performed: LAPAROSCOPIC INSERTION GASTROSTOMY TUBE     Patient location during evaluation: PACU Anesthesia Type: General Level of consciousness: awake and alert Pain management: pain level controlled Vital Signs Assessment: post-procedure vital signs reviewed and stable Respiratory status: spontaneous breathing, nonlabored ventilation and respiratory function stable Cardiovascular status: blood pressure returned to baseline and stable Postop Assessment: no apparent nausea or vomiting Anesthetic complications: no   No notable events documented.  Last Vitals:    Last Pain:                 Collene Schlichter

## 2023-06-22 NOTE — Plan of Care (Signed)

## 2023-06-22 NOTE — Progress Notes (Signed)
Tube feed (osmolite 1.5) initiated this evening at 2030 at a rate of 39ml/hr with every 4 hours free water flushes, feeding is infusing well through g-tube, split gauze dressing CDI around insertion site, abdominal binder remains in place, patient eager to advance from npo to some liquids, denies any pain, robinul given once for excess secretions, patient using bedside suction equipment to help clear, will continue to monitor for feeding tolerance.

## 2023-06-22 NOTE — Progress Notes (Signed)
Nutrition Follow-up  DOCUMENTATION CODES:   Severe malnutrition in context of chronic illness  INTERVENTION:  - Continue advancing to goal TF regimen as below: Osmolite 1.5 at 65 ml/h (1560 ml per day) *per MD, advance by 10mL Q6H Prosource TF20 60 ml BID +167mL Q4H FWF ( ) Provides 2500 kcal, 138 gm protein, 2239 ml free water daily   - Monitor magnesium, potassium, and phosphorus BID for at least 3 days, MD to replete as needed, as pt is at risk for refeeding syndrome given significant weight loss and ongoing inadequate oral intake. - Continue 100mg  thiamine x5 days due to risk of refeeding.   - CLD per MD.    - Monitor BM's - last bowel movement 2/3.  - No bowel regimen currently ordered.  - Monitor weight trends.   - Possible discharge over the weekend. Would recommend trialing bolus tube feeds prior to discharge to assess tolerance as plan for bolus on discharge.  - Consider: 1 carton 3-4 times daily to start with to assess tolerance to bolus prior to discharge.   - Patient can then slowly increase as tolerated to goal bolus regimen at home.  - Can provide home TF instructions to patient tomorrow.    Home bolus TF regimen for discharge: 7 cartons of Osmolite 1.5 Can consider: 2 cartons @ 7am, 12pm, 5pm, and 1 carton @ 9pm +60mL before and after each bolus AND free water flushes 6x daily (total of ) Provides 2489 kcal, 104 gm protein, 2254 ml free water daily *Patient will need to try and consume 1 Ensure Max daily to meet protein needs.  Ensure Max and TF regimen together would provide 2639 kcals and 134g protein  - Patient to follow up with outpatient oncology RD for TF tolerance and long term management.   NUTRITION DIAGNOSIS:   Severe Malnutrition related to chronic illness (metastatic laryngeal squamous cell carcinoma stage IV) as evidenced by mild fat depletion, mild muscle depletion, energy intake < or equal to 75% for > or equal to 1 month,  percent weight loss (26% in ~1 year). *ongoing  GOAL:   Patient will meet greater than or equal to 90% of their needs *progressing, on TF  MONITOR:   PO intake, Supplement acceptance, Diet advancement, Weight trends, TF tolerance  REASON FOR ASSESSMENT:   Consult Enteral/tube feeding initiation and management  ASSESSMENT:   68 y.o. 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  2/6 Admit 2/10 G-tube placed 2/11 Osmolite 1.5 started at 46mL/hr 2/12 G-tube fell out after balloon popped; G-tube replaced in the afternoon and TF restarted  G-tube fell out yesterday but tube was able to be replaced the same day and tube feeds restated at 30mL/hr overnight.   TF's infusing via G-tube at time of visit today. Patient endorses tolerating well with no issues.  Of note, orders modified this AM by RN to start increasing by 10mL Q6H. Discussed with MD and RN and MD okay with increasing Q6H so plan to advance more quickly. Would recommend still checking electrolytes due to ongoing refeeding risk.   Plan for patient to discharge home once tolerating goal regimen.  Discussed with MD would recommend patient try bolus tube feeds prior to discharge to ensure tolerance. Once tolerating goal continuous regimen, would recommend patient try 1 carton boluses 3-4 times a day. MD in agreement with  plan. Will follow up tomorrow to assess tolerance to continuous feeds and consider transitioning to bolus.  Of note, per RN documentation patient has not had a BM since 2/3 (10 days ago).    Admit weight: 239# Current weight: 236# I&O's: +13L since admit  Medications reviewed and include: Q4H FWF, 100mg  thiamine x5 days  Labs reviewed:  -   Diet Order:   Diet Order             Diet clear liquid Room  service appropriate? Yes; Fluid consistency: Thin  Diet effective now                   EDUCATION NEEDS:  Education needs have been addressed  Skin:  Skin Assessment: Reviewed RN Assessment  Last BM:  2/3  Height:  Ht Readings from Last 1 Encounters:  06/21/23 6' (1.829 m)   Weight: Wt Readings from Last 1 Encounters:  06/22/23 107.4 kg   Ideal Body Weight:  80.91 kg  BMI:  Body mass index is 32.11 kg/m.  Estimated Nutritional Needs:  Kcal:  2450-2650 kcals Protein:  130-150 grams Fluid:  >/= 2.2L    Shelle Iron RD, LDN Contact via Secure Chat.

## 2023-06-23 DIAGNOSIS — R1313 Dysphagia, pharyngeal phase: Secondary | ICD-10-CM | POA: Diagnosis not present

## 2023-06-23 LAB — BASIC METABOLIC PANEL
Anion gap: 10 (ref 5–15)
BUN: 17 mg/dL (ref 8–23)
CO2: 28 mmol/L (ref 22–32)
Calcium: 8.9 mg/dL (ref 8.9–10.3)
Chloride: 101 mmol/L (ref 98–111)
Creatinine, Ser: 0.88 mg/dL (ref 0.61–1.24)
GFR, Estimated: >60 mL/min (ref >60)
Glucose, Bld: 171 mg/dL — ABNORMAL HIGH (ref 70–99)
Potassium: 3.5 mmol/L (ref 3.5–5.1)
Sodium: 139 mmol/L (ref 135–145)

## 2023-06-23 LAB — GLUCOSE, CAPILLARY
Glucose-Capillary: 141 mg/dL — ABNORMAL HIGH (ref 70–99)
Glucose-Capillary: 157 mg/dL — ABNORMAL HIGH (ref 70–99)
Glucose-Capillary: 166 mg/dL — ABNORMAL HIGH (ref 70–99)
Glucose-Capillary: 171 mg/dL — ABNORMAL HIGH (ref 70–99)
Glucose-Capillary: 175 mg/dL — ABNORMAL HIGH (ref 70–99)
Glucose-Capillary: 188 mg/dL — ABNORMAL HIGH (ref 70–99)
Glucose-Capillary: 208 mg/dL — ABNORMAL HIGH (ref 70–99)

## 2023-06-23 LAB — MAGNESIUM: Magnesium: 1.6 mg/dL — ABNORMAL LOW (ref 1.7–2.4)

## 2023-06-23 LAB — PHOSPHORUS: Phosphorus: 2.6 mg/dL (ref 2.5–4.6)

## 2023-06-23 MED ORDER — FREE WATER
125.0000 mL | Status: DC
  Administered 2023-06-23 – 2023-06-24 (×6): 125 mL

## 2023-06-23 MED ORDER — OSMOLITE 1.5 CAL PO LIQD
237.0000 mL | Freq: Four times a day (QID) | ORAL | Status: DC
  Administered 2023-06-23 – 2023-06-24 (×4): 237 mL
  Filled 2023-06-23 (×5): qty 237

## 2023-06-23 MED ORDER — MAGNESIUM SULFATE 2 GM/50ML IV SOLN
2.0000 g | Freq: Once | INTRAVENOUS | Status: AC
  Administered 2023-06-23: 2 g via INTRAVENOUS
  Filled 2023-06-23: qty 50

## 2023-06-23 NOTE — Progress Notes (Signed)
Progress Note   Patient: Jesus [name] NFA:[id number] DOB: 10-19-1955 DOA: 06/15/2023     7 DOS: the patient was seen and examined on 06/23/2023   Brief hospital course:  Jesus [name] is a 68 y.o. 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  Has not been able to eat or drink anything over the last 3 days.  Additionally unable to control his saliva.  Follows with ENT, Dr. Pollyann Kennedy outpatient.  Was informed by ENT if his ability to swallow became worse to seek further care in the ED.   In the ED, temperature 98.1 F, HR 90, RR 15, BP 110/57, SpO2 97% on room air.  WBC 9.5, hemoglobin 8.2, platelet count 540.  Sodium 136, potassium 3.9, chloride 98, CO2 27, glucose 136, BUN 18, Cram 1.26.  AST 12, ALT 12, total bili 0.7.  High sensitive troponin 9.  Lactic acid 0.7.  COVID/influenza/RSV PCR negative.  Chest x-ray with no active cardiopulmonary disease process, rightward tracheal deviation with left peritracheal opacity corresponding to likely mass seen on prior PET/CT.  Soft tissue neck x-ray with slightly thickened appearance of the epiglottis and slightly hazy appearance of the glottic area suggesting mild edema or inflammation; possible mild narrowing of the subglottic trachea as well.  CT soft tissue neck with circumferential soft tissue abnormality surrounding the trachea posteriorly displacing the esophagus to the level of the thoracic inlet, worsened since 04/14/2023 and consistent with progression of glottic squamous cell carcinoma, acute inflammatory change of above math likely postradiation effect.  CT angiogram chest with no evidence of pulmonary embolism.  Blood cultures x 2 collected.  Patient was started empiric antibiotics with vancomycin and  cefepime.  TRH consulted for admission for further evaluation management of dysphagia likely secondary to progressive metastatic laryngeal squamous cell carcinoma.    Assessment and Plan: No notes have been filed under this hospital service. Service: Hospitalist  Acute dysphagia secondary to laryngeal squamous cell carcinoma -On immunotherapy Pembrolizumab  -Complain mild exertional dyspnea and continues dysphagia of solid food -Status post PEG tube insertion on 12/12 -On continuous tube feeding Osmolite at 65 mL/h with free water flush 175 mL every 4 hours -Prosource 60 mg per tube twice daily -Discussed with dietitian, who recommend initial continuous tube feeding was due to refeeding risk but plan is for bolus tube feeds on discharge. -Continue at at 12mL/hr until 2pm and then turn off continuous feeds for 2 hours before trying a 1 carton bolus at around 4pm and then another at 9pm. if he does well, with initial bolus feeding, likely can be discharged home and slowly increase his boluses at home as he tolerates. He will be followed outpatient by a cancer center dietitian.  Expect bleeding which goal within 24 hours patient likely can be discharged home tomorrow.  Acute renal failure secondary to prerenal azotemia -Solved.  Now appears to be euvolemic  Hypophosphatemia -Resolved  Chronic normocytic anemia -Likely secondary to chronic illness  Hypertension -Stable, monitor off BP meds      Subjective: Patient has no complaints, tolerated continuous PEG tube feeding for more than 24 hours without any nauseous vomiting abdominal pain.  Denied any chronic cough or chest pains.  Physical Exam: Vitals:   06/22/23 2028 06/23/23 0509 06/23/23 0702 06/23/23 1424  BP: 108/80 127/79  135/87  Pulse: 89 80  (!) 102  Resp: 16 16  18   Temp: 97.9 F (36.6 C) 98.6 F (37 C)  99.1 F (37.3 C)  TempSrc: Oral   Oral  SpO2: 100% 99%  98%  Weight:   110 kg   Height:       Eyes: PERRL,  lids and conjunctivae normal ENMT: Mucous membranes are moist. Posterior pharynx clear of any exudate or lesions.Normal dentition.  Neck: normal, supple, no masses, no thyromegaly Respiratory: clear to auscultation bilaterally, no wheezing, no crackles. Normal respiratory effort. No accessory muscle use.  Cardiovascular: Regular rate and rhythm, no murmurs / rubs / gallops. No extremity edema. 2+ pedal pulses. No carotid bruits.  Abdomen: no tenderness, no masses palpated. No hepatosplenomegaly. Bowel sounds positive.  Musculoskeletal: no clubbing / cyanosis. No joint deformity upper and lower extremities. Good ROM, no contractures. Normal muscle tone.  Skin: no rashes, lesions, ulcers. No induration Neurologic: CN 2-12 grossly intact. Sensation intact, DTR normal.  Muscle strength 5/5 on both sides Psychiatric: Normal judgment and insight. Alert and oriented x 3. Normal mood.    Data Reviewed: Today's CBC BMP result reviewed.  Family Communication: None at bedside  Disposition: Status is: Inpatient Remains inpatient appropriate because: Given the present risk of refeeding syndrome, current PEG tube feeding regimen need to be monitored closely for refeeding syndromes and thus requires inpatient management.  Planned Discharge Destination: Home with Home Health    Time spent: 35 minutes  Author: Emeline General, MD 06/23/2023 2:48 PM  For on call review [email or web address].

## 2023-06-23 NOTE — Progress Notes (Signed)
Rounded on pt for line care. Educated that port needles are changed every 7 days to decrease infection risk. Pt states plan is for him to leave tomorrow. Has been frustrated with the whole hospitalization so prefers to wait until tomorrow to see if he is discharged before having his port needle changed.

## 2023-06-23 NOTE — Plan of Care (Signed)
Problem: Nutrition: Goal: Adequate nutrition will be maintained Outcome: Progressing

## 2023-06-23 NOTE — Progress Notes (Addendum)
Nutrition Follow-up  DOCUMENTATION CODES:   Severe malnutrition in context of chronic illness  INTERVENTION:  - Plan to turn off continuous feeds @ 2pm today.   - Will then transition to bolus feeds:  - TODAY: 1 carton Osmolite 1.5 @ 4pm and 1 carton @ 9pm  - Tomorrow: 1 carton Osmolite 1.5 @ 8am, 12p, 4p, 9p (4 cartons/day).   - New FWF: 30mL before and after each bolus +m Q4H  - Patient likely to discharge home tomorrow, see recs below for home TF)   - Monitor magnesium, potassium, and phosphorus BID for at least 3 days, MD to replete as needed, as pt is at risk for refeeding syndrome given significant weight loss and ongoing inadequate oral intake. - Continue 100mg  thiamine x5 days due to risk of refeeding.   - CLD per MD.    - Monitor weight trends.   - Patient to likely discharge home tomorrow. He will need to slowly increase to goal regimen of 7 cartons daily as tolerated.   - Provided patient with home TF instructions handout and recommended advancements, as below: 1st day: 4 cartons  2nd day: 5 cartons (1.5 cartons 2x and 1 carton 2x) 3rd day: 6 cartons (1.5 cartons 3x/day and 1 carton once) 4th day: 7 cartons (2 cartons 3x/day and 1 carton once) (GOAL) *Discussed with patient that if he has trouble with tolerating increases daily, can go as slow as needed to increase to goal.     Home bolus TF regimen for discharge: 7 cartons of Osmolite 1.5 (or equivalent) (2 cartons @ 6am, 11pm, 4pm, and 1 carton @ 9pm) +26mL before and after each bolus AND free water flushes 6x daily (total of ) Provides 2489 kcal, 104 gm protein, 2254 ml free water daily *Patient will need to try and consume 1 Ensure Max daily to meet protein needs.  Ensure Max and TF regimen together would provide 2639 kcals and 134g protein   - Patient to follow up with outpatient oncology RD for TF tolerance and long term management.   NUTRITION DIAGNOSIS:   Severe Malnutrition related to  chronic illness (metastatic laryngeal squamous cell carcinoma stage IV) as evidenced by mild fat depletion, mild muscle depletion, energy intake < or equal to 75% for > or equal to 1 month, percent weight loss (26% in ~1 year). *ongoing  GOAL:   Patient will meet greater than or equal to 90% of their needs *progressing, on TF  MONITOR:   PO intake, Supplement acceptance, Diet advancement, Weight trends, TF tolerance  REASON FOR ASSESSMENT:   Consult Enteral/tube feeding initiation and management  ASSESSMENT:   68 y.o. 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  2/6 Admit 2/10 G-tube placed 2/11 Osmolite 1.5 started at 55mL/hr 2/12 G-tube fell out after balloon popped; G-tube replaced in the afternoon and TF restarted 2/14 Reached continuous goal of 55mL/hr, trialing bolus  Patient in bedside chair at time of visit, TF infusing at goal of 33mL/hr. Was just increased to goal at 10am today per discussion with RN.  Patient reports tolerating tube feeds well with no issues. Patient finally had a BM yesterday.  He is taking in some liquids (had 3 small sodas yesterday) but notes today it seems more difficult for PO. This seems to vary daily.   Recommend patient trial bolus TF's prior  to discharge to ensure tolerance as plan for bolus TF's at home. MD in agreement.   Discussed plan with patient and RN to turn off continuous feeds as 2pm today and then try a 1 carton bolus at 4pm and another at 9pm.  If patient does well, recommended he try 1 carton 4x/day tomorrow.   As patient is likely to discharge home tomorrow, he will need to slowly increase bolus tube feeds as tolerated.  Provided patient with home bolus TF instructions handout. Also provided instructions on increasing to goal of 7 cartons  daily.  1st day: 4 cartons  2nd day: 5 cartons (1.5 cartons 2x and 1 carton 2x) 3rd day: 6 cartons (1.5 cartons 3x/day and 1 carton once) 4th day: 7 cartons (2 cartons 3x/day and 1 carton once) (GOAL)  Discussed that if patient has trouble with increasing this advancement can be done slower and as tolerated.  Also reviewed free water flushes with patient to ensure hydration needs are met.   Patient endorsed understanding of all information.  He reports frustration about extended hospital stay but understands it is important to make sure he tolerates bolus tube feeds so he does not have trouble at home and have to come back to the hospital.   Patient to follow up with outpatient oncology dietitian to help with home TF management.   [employer] is patient's home infusion company. Per Jeri Modena with [employer], patient to likely be started on [employer] product on discharge so will likely be receiving Nutren 1.5.  Informed patient of this information and that formula may change.   Admit weight: 239# Current weight: 242#  Medications reviewed and include: Q4H FWF, 100mg  thiamine (ends tomorrow)  Labs reviewed:  Magnesium 1.6   Diet Order:   Diet Order             Diet clear liquid Room service appropriate? Yes; Fluid consistency: Thin  Diet effective now                   EDUCATION NEEDS:  Education needs have been addressed  Skin:  Skin Assessment: Reviewed RN Assessment  Last BM:  2/13  Height:  Ht Readings from Last 1 Encounters:  06/21/23 6' (1.829 m)   Weight:  Wt Readings from Last 1 Encounters:  06/23/23 110 kg   Ideal Body Weight:  80.91 kg  BMI:  Body mass index is 32.89 kg/m.  Estimated Nutritional Needs: Kcal:  2450-2650 kcals Protein:  130-150 grams Fluid:  >/= 2.2L    Shelle Iron RD, LDN Contact via Secure Chat.

## 2023-06-24 DIAGNOSIS — C329 Malignant neoplasm of larynx, unspecified: Secondary | ICD-10-CM | POA: Diagnosis not present

## 2023-06-24 DIAGNOSIS — R1310 Dysphagia, unspecified: Secondary | ICD-10-CM | POA: Diagnosis not present

## 2023-06-24 LAB — BASIC METABOLIC PANEL
Anion gap: 6 (ref 5–15)
BUN: 16 mg/dL (ref 8–23)
CO2: 31 mmol/L (ref 22–32)
Calcium: 8.4 mg/dL — ABNORMAL LOW (ref 8.9–10.3)
Chloride: 103 mmol/L (ref 98–111)
Creatinine, Ser: 0.79 mg/dL (ref 0.61–1.24)
GFR, Estimated: >60 mL/min (ref >60)
Glucose, Bld: 110 mg/dL — ABNORMAL HIGH (ref 70–99)
Potassium: 3.6 mmol/L (ref 3.5–5.1)
Sodium: 140 mmol/L (ref 135–145)

## 2023-06-24 LAB — MAGNESIUM: Magnesium: 1.7 mg/dL (ref 1.7–2.4)

## 2023-06-24 LAB — GLUCOSE, CAPILLARY
Glucose-Capillary: 109 mg/dL — ABNORMAL HIGH (ref 70–99)
Glucose-Capillary: 129 mg/dL — ABNORMAL HIGH (ref 70–99)

## 2023-06-24 MED ORDER — GLYCOPYRROLATE 1 MG PO TABS: 1.0000 mg | ORAL_TABLET | ORAL | 2 refills | Status: DC | PRN

## 2023-06-24 MED ORDER — OSMOLITE 1.5 CAL PO LIQD: ORAL | 3 refills | Status: DC

## 2023-06-24 MED ORDER — FREE WATER: 125.0000 mL | Status: DC

## 2023-06-24 NOTE — Discharge Summary (Signed)
Physician Discharge Summary  Jesus [name] ZOX:[id number] DOB: 02/18/1956 DOA: 06/15/2023  PCP: Noberto Retort, MD  Admit date: 06/15/2023 Discharge date: 06/24/2023  Admitted From: Home Disposition: Home  Recommendations for Outpatient Follow-up:  Follow up with PCP in 1-2 weeks Please obtain BMP/CBC in one week Please follow up on the following pending results:  Home Health: RN Equipment/Devices: G-tube  Discharge Condition: Stable CODE STATUS: Full code  Diet recommendation: Clear liquid diet as tolerates, tube feeds Home bolus TF regimen for discharge: 7 cartons of Osmolite 1.5 (or equivalent) (2 cartons @ 6am, 11pm, 4pm, and 1 carton @ 9pm) +39mL before and after each bolus AND free water flushes 6x daily (total of ) Provides 2489 kcal, 104 gm protein, 2254 ml free water daily *Patient will need to try and consume 1 Ensure Max daily to meet protein needs.  Ensure Max and TF regimen together would provide 2639 kcals and 134g protein   History of present illness:  Jesus [name] is a 68 y.o. 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  Has not been able to eat or drink anything over the last 3 days.  Additionally unable to control his saliva.  Follows with ENT, Dr. Pollyann Kennedy outpatient.  Was informed by ENT if his ability to swallow became worse to seek further care in the ED.   In the ED, temperature 98.1 F, HR 90, RR 15, BP 110/57, SpO2 97% on room air.  WBC 9.5, hemoglobin 8.2, platelet count 540.  Sodium 136, potassium 3.9, chloride 98, CO2 27, glucose 136, BUN 18, Cram 1.26.  AST 12, ALT 12, total bili 0.7.  High sensitive troponin 9.  Lactic acid 0.7.  COVID/influenza/RSV PCR negative.  Chest x-ray with no active  cardiopulmonary disease process, rightward tracheal deviation with left peritracheal opacity corresponding to likely mass seen on prior PET/CT.  Soft tissue neck x-ray with slightly thickened appearance of the epiglottis and slightly hazy appearance of the glottic area suggesting mild edema or inflammation; possible mild narrowing of the subglottic trachea as well.  CT soft tissue neck with circumferential soft tissue abnormality surrounding the trachea posteriorly displacing the esophagus to the level of the thoracic inlet, worsened since 04/14/2023 and consistent with progression of glottic squamous cell carcinoma, acute inflammatory change of above math likely postradiation effect.  CT angiogram chest with no evidence of pulmonary embolism.  Blood cultures x 2 collected.  Patient was started empiric antibiotics with vancomycin and cefepime.  TRH consulted for admission for further evaluation management of dysphagia likely secondary to progressive metastatic laryngeal squamous cell carcinoma.  Hospital course:  Dysphagia secondary to laryngeal squamous cell carcinoma Patient follows with ENT, Dr. Pollyann Kennedy and medical oncology, Dr. Candise Che outpatient.  Continues on immunotherapy with Pembrolizumab.  CT imaging on admission with circumferential soft tissue abnormality surround the trachea posterior displacing the esophagus to the level of thoracic inlet, progressive since 04/14/2023.  Denies any shortness of breath or respiratory compromise.  General surgery was consulted and patient underwent laparoscopic insertion of gastrostomy tube by Dr. Dossie Der on 06/19/2023.  Patient's G-tube became dislodged on 06/21/2023 and underwent repeat laparoscopic insertion of G-tube on 06/21/2023.  Dietitian was consulted and patient was started on tube feeds with Osmolite 1.5.  And free water flushes with toleration. Outpatient follow-up with  ENT for definitive treatment of dysphagia secondary to laryngeal squamous cell carcinoma    Plan for Tube feeds on discharge: Osmolite 1.5: 2 cartons @ 7am, 12pm, 5pm, and 1 carton @ 9pm FWF 30mL before and after each bolus and free water flushes 6x daily (total of ) Provides 2489 kcal, 104 gm protein, 2254 ml free water daily *Patient will need to try and consume 1 Ensure Max daily to meet protein needs.  Ensure Max and TF regimen together would provide 2639 kcals and 134g protein   Concern for pulmonary infiltrate, ruled out CT chest with questionable infiltrate, although patient asymptomatic and CT findings vague with motion artifact, edema.  Initially started on antibiotics and remained afebrile with no further symptoms antibiotics were discontinued.   Acute renal failure secondary to prerenal azotemia: Resolved Patient presenting with an elevated creatinine of 1.26 in the setting of poor oral intake due to his underlying dysphagia.  Etiology likely secondary to dehydration.  Supported with IV fluids.  Creatinine improved to 0.79 at time of discharge.   Hypophosphatemia Repleted   Excessive secretion Robinul PRN.    Anemia Etiology likely secondary to chronic medical disease.  Anemia panel with iron 24, TIBC low at 235, ferritin 144, vitamin B12 2376.  Hemoglobin trended down to a low of 6.9 and was transfused 1 unit PRBC on 06/18/2023.  Hemoglobin remained stable thereafter, 8.1 at time of discharge.   Hx hypertension No longer taking antihypertensives/amlodipine.  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)   CAP (community acquired pneumonia)   Immunocompromised patient (HCC)   Protein-calorie malnutrition, severe    Discharge Instructions  Discharge Instructions      Call MD for:  difficulty breathing, headache or visual disturbances   Complete by: As directed    Call MD for:  extreme fatigue   Complete by: As directed    Call MD for:  persistant dizziness or light-headedness   Complete by: As directed    Call MD for:  persistant nausea and vomiting   Complete by: As directed    Call MD for:  severe uncontrolled pain   Complete by: As directed    Call MD for:  temperature >100.4   Complete by: As directed    Diet - low sodium heart healthy   Complete by: As directed    Increase activity slowly   Complete by: As directed       Allergies as of 06/24/2023       Reactions   Bee Venom Anaphylaxis   Lisinopril Other (See Comments)   Swelling in face   Antifungal [miconazole Nitrate] Other (See Comments)    made his head hurt   Zolpidem Tartrate Er Other (See Comments)   Leg cramps        Medication List     STOP taking these medications    acetaminophen 500 MG tablet Commonly known as: TYLENOL Replaced by: acetaminophen 160 MG/5ML solution   amLODipine 10 MG tablet Commonly known as: NORVASC   Arexvy 120 MCG/0.5ML injection Generic drug: RSV vaccine recomb adjuvanted   aspirin EC 81 MG tablet   azithromycin 200 MG/5ML suspension Commonly known as: ZITHROMAX   b complex vitamins capsule  cholecalciferol 25 MCG (1000 UNIT) tablet Commonly known as: VITAMIN D3   lidocaine 2 % solution Commonly known as: XYLOCAINE   metFORMIN 500 MG tablet Commonly known as: GLUCOPHAGE   MULTI ADULT GUMMIES PO   nystatin 100000 UNIT/ML suspension Commonly known as: MYCOSTATIN   oxyCODONE 5 MG immediate release tablet Commonly known as: Oxy IR/ROXICODONE Replaced by: oxyCODONE 5 MG/5ML solution   prochlorperazine 10 MG tablet Commonly known as: COMPAZINE   sildenafil 100 MG tablet Commonly known as: VIAGRA       TAKE these medications    Accu-Chek Guide test strip Generic drug: glucose blood 1 each by Other route as  needed for other.   Accu-Chek Softclix Lancets lancets   acetaminophen 160 MG/5ML solution Commonly known as: TYLENOL Place 20.3 mLs (650 mg total) into feeding tube every 6 (six) hours as needed for mild pain (pain score 1-3). Replaces: acetaminophen 500 MG tablet   feeding supplement (OSMOLITE 1.5 CAL) Liqd 2 cartons @ 7am, 12pm, 5pm, and 1 carton @ 9pm. Free water +27mL before and after each bolus AND free water flushes 6x daily (total of )   free water Soln Place 125 mLs into feeding tube every 4 (four) hours.   glycopyrrolate 1 MG tablet Commonly known as: ROBINUL Take 1 tablet (1 mg total) by mouth every 4 (four) hours as needed (excessive secretions).   lidocaine-prilocaine cream Commonly known as: EMLA Apply to affected area once   oxyCODONE 5 MG/5ML solution Commonly known as: ROXICODONE Place 5 mLs (5 mg total) into feeding tube every 6 (six) hours as needed for breakthrough pain. Replaces: oxyCODONE 5 MG immediate release tablet               Durable Medical Equipment  (From admission, onward)           Start     Ordered   06/21/23 0656  For home use only DME Tube feeding  Once       Comments: 7 cartons of Osmolite 1.5 Can consider: 2 cartons @ 7am, 12pm, 5pm, and 1 carton @ 9pm +9mL before and after each bolus AND free water flushes 6x daily (total of ) Provides 2489 kcal, 104 gm protein, 2254 ml free water daily *Patient will need to try and consume 1 Ensure Max daily to meet protein needs.  Ensure Max and TF regimen together would provide 2639 kcals and 134g protein   06/21/23 0655   06/20/23 0652  For home use only DME Suction  Once       Question:  Suction  Answer:  Oral   06/20/23 0651            Follow-up Information     Maczis, Hedda Slade, PA-C Follow up.   Specialty: General Surgery Why: Call to confirm your appointment date and time, bring a copy of your photo ID and insurance card, arrive 30 minutes prior to  your appointment Contact information: [address] [place] [address] [phone]         Noberto Retort, MD. Schedule an appointment as soon as possible for a visit in 1 week(s).   Specialty: Family Medicine Contact information: [phone] [address] [place] [address] [phone]         Serena Colonel, MD Follow up.   Specialty: Otolaryngology Contact information: [address] [place] [address] [phone]  Allergies  Allergen Reactions   Bee Venom Anaphylaxis   Lisinopril Other (See Comments)    Swelling in face   Antifungal [Miconazole Nitrate] Other (See Comments)     made his head hurt   Zolpidem Tartrate Er Other (See Comments)    Leg cramps    Consultations: General surgery   Procedures/Studies: IR REPLACE G-TUBE SIMPLE WO FLUORO Result Date: 06/22/2023 INDICATION: 68 year old male with surgically placed gastrostomy tube 48 hours ago. This has been damaged and displaced. The patient presents for attempted rescue/replacement. EXAM: IR TUBE PLACEMENT/REPLACEMENT/CHECK DISCONTINUATION OF GASTROSTOMY PLACEMENT MEDICATIONS: None ANESTHESIA/SEDATION: None CONTRAST:  10mL OMNIPAQUE IOHEXOL 300 MG/ML SOLN - administered into the gastric lumen. FLUOROSCOPY: Radiation Exposure Index (as provided by the fluoroscopic device): 100 mGy Kerma COMPLICATIONS: None PROCEDURE: Informed written consent was obtained from the patient and the patient's family after a thorough discussion of the procedural risks, benefits and alternatives. All questions were addressed. Maximal Sterile Barrier Technique was utilized including caps, mask, sterile gowns, sterile gloves, sterile drape, hand hygiene and skin antiseptic. A timeout was performed prior to the initiation of the procedure. The epigastrium was prepped with Betadine in a sterile fashion, and a sterile drape was applied covering the operative field. A sterile  gown and sterile gloves were used for the procedure. Under fluoroscopic guidance, contrast was injected through the surgical site representing the ostomy of the prior gastrostomy. Contrast injected into the subcutaneous tissues, with no obvious tract into the stomach lumen. A Kumpe the catheter was then advanced through the surgical site into the soft tissues, with contrast injected attempting to identify the tract into the stomach. AP and oblique images were used in the attempt to find the possible tract. Ultimately we were unable to find a viable tract through the abdominal wall into the stomach lumen, with only excavation of the superficial subcutaneous tissues with contrast. We withdrew from the attempt with no tract identified. Patient tolerated the procedure well and remained hemodynamically stable throughout. No complications were encountered and no significant blood loss encountered. IMPRESSION: Status post failed attempt of rescue/placement of damaged and displaced surgical gastrostomy. Signed, Yvone Neu. Loreta Ave, DO [facility] Electronically Signed   By: Gilmer Mor D.O.   On: 06/22/2023 16:16   CT Soft Tissue Neck W Contrast Result Date: 06/15/2023 CLINICAL DATA:  Shortness of breath with hemoptysis EXAM: CT NECK WITH CONTRAST TECHNIQUE: Multidetector CT imaging of the neck was performed using the standard protocol following the bolus administration of intravenous contrast. RADIATION DOSE REDUCTION: This exam was performed according to the departmental dose-optimization program which includes automated exposure control, adjustment of the mA and/or kV according to patient size and/or use of iterative reconstruction technique. CONTRAST:  75mL OMNIPAQUE IOHEXOL 350 MG/ML SOLN COMPARISON:  05/05/2022 CT neck 04/14/2023 PET CT FINDINGS: Pharynx and larynx: At the level of the lower glottis and inferior aspect of the thyroid gland, there is  circumferential soft tissue abnormality surrounding the trachea and posteriorly displacing the esophagus. This masslike region measures approximately 4.0 x 4.2 x 5.2 cm. The mass appears more infiltrative of the surrounding tissues with somewhat indistinct margins suggestive of inflammation. Salivary glands: No inflammation, mass, or stone. Thyroid: Above described soft tissue abnormality at the level of the thoracic inlet is continuous with the lower left thyroid lobe. This causes marked rightward deviation of the trachea, which remains patent. Lymph nodes: Previously visualized enlarged lymph nodes of the right cervical chain have resolved. Unchanged left level 3  node measuring 6 mm (2:67). No enlarged or abnormal density nodes. Vascular: Right IJ approach central venous catheter with tip below the field of view (Port-A-Cath), with access needle. Limited intracranial: Normal Visualized orbits: Normal Mastoids and visualized paranasal sinuses: Clear. Skeleton: No acute or aggressive process. Upper chest: Negative. Other: None. IMPRESSION: 1. Circumferential soft tissue abnormality surrounding the trachea and posteriorly displacing the esophagus at the level of the thoracic inlet, worsened since 04/14/2023 and consistent with progression of glottic squamous cell carcinoma. 2. Area inflammatory change associated with the above mass is likely a post radiation effect. 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 3. Previously visualized enlarged lymph nodes of the right cervical chain have resolved. Electronically Signed   By: Deatra Robinson M.D.   On: 06/15/2023 23:01   CT Angio Chest PE W and/or Wo Contrast Result Date: 06/15/2023 CLINICAL DATA:  Pulmonary embolus suspected with high probability. Shortness of breath, tachycardia, productive cough with hemoptysis. Cancer. Difficulty swallowing. Known throat cancer. EXAM: CT ANGIOGRAPHY CHEST WITH CONTRAST TECHNIQUE: Multidetector CT  imaging of the chest was performed using the standard protocol during bolus administration of intravenous contrast. Multiplanar CT image reconstructions and MIPs were obtained to evaluate the vascular anatomy. RADIATION DOSE REDUCTION: This exam was performed according to the departmental dose-optimization program which includes automated exposure control, adjustment of the mA and/or kV according to patient size and/or use of iterative reconstruction technique. CONTRAST:  75mL OMNIPAQUE IOHEXOL 350 MG/ML SOLN COMPARISON:  Chest radiograph 06/15/2023. PET-CT 04/14/2023. CT chest 05/05/2022 FINDINGS: Cardiovascular: Technically adequate study with good opacification of the central and segmental pulmonary arteries. Mild motion artifact. No focal filling defects. No evidence of significant pulmonary embolus. Normal heart size. No pericardial effusions. Normal caliber thoracic aorta. No aortic dissection. Great vessel origins are patent. Mediastinum/Nodes: Right central venous catheter with tip at the cavoatrial junction. Eccentric esophageal or paraesophageal mass at the thoracic inlet measuring about 3.6 cm diameter. This displaces the trachea towards the right. This is consistent with known esophageal neoplasm. The lateral wall of the trachea is thickened and irregular, likely indicating direct invasion of the trachea. The distal esophagus is decompressed. Mildly enlarged mediastinal lymph nodes with pretracheal nodes measuring up to 10 mm short axis dimension. These are nonspecific. Lungs/Pleura: Patchy perihilar infiltrates could be due to motion artifact or may indicate mild edema or pneumonia. Bandlike opacities in the left lung base likely representing scarring. No pleural effusion or pneumothorax. Upper Abdomen: No acute abnormalities. Renal cysts, largest on the left measuring 5.7 cm diameter. No imaging follow-up is indicated. Musculoskeletal: Degenerative changes in the spine. No destructive bone lesions.  Review of the MIP images confirms the above findings. IMPRESSION: 1. Esophageal mass of the level of the thoracic inlet, displacing the trachea. This likely corresponds to known cancer. The adjacent tracheal wall is irregular, likely representing direct tumor invasion. Nonspecific mediastinal lymph nodes could be metastatic. 2. No evidence of significant pulmonary embolus. 3. Patchy perihilar infiltrates may represent motion artifact, edema, or pneumonia. Electronically Signed   By: Burman Nieves M.D.   On: 06/15/2023 21:43   DG Chest 2 View Result Date: 06/15/2023 CLINICAL DATA:  Neck pain difficulty swallowing EXAM: CHEST - 2 VIEW COMPARISON:  08/16/2022 FINDINGS: Right-sided central venous port tip at the SVC. No acute airspace disease or effusion. Normal cardiac size. Rightward tracheal deviation with left paratracheal opacity. IMPRESSION: 1. No active cardiopulmonary disease. 2. Rightward tracheal deviation with left paratracheal opacity,, presumably corresponding to mass seen  on prior PET CT. Electronically Signed   By: Jasmine Pang M.D.   On: 06/15/2023 20:12   DG Neck Soft Tissue Result Date: 06/15/2023 CLINICAL DATA:  Neck pain difficulty swallowing EXAM: NECK SOFT TISSUES - 1+ VIEW COMPARISON:  Esophagram 06/09/2023, swallow studies 05/25/2023, 04/14/2023 FINDINGS: Normal prevertebral soft tissue thickness. No retropharyngeal thickening or gas. Slightly thickened appearance of the epiglottis and slightly hazy appearance of the glottic area. There may be slight narrowing of the subglottic trachea. Suspect similar appearance on recent swallowing exams. IMPRESSION: Slightly thickened appearance of the epiglottis and slightly hazy appearance of the glottic area suggesting mild edema or inflammation. Consider correlation with direct visualization. Possible mild narrowing of the subglottic trachea as well. Electronically Signed   By: Jasmine Pang M.D.   On: 06/15/2023 20:10   DG ESOPHAGUS W SINGLE CM  (SOL OR THIN BA) Result Date: 06/09/2023 CLINICAL DATA:  68 year old male with squamous cell carcinoma of the glottis status post radiation therapy. Dysphagia. EXAM: ESOPHOGRAM/BARIUM SWALLOW TECHNIQUE: Single contrast examination was performed using thin barium or water soluble. FLUOROSCOPY: Radiation Exposure Index (as provided by the fluoroscopic device): 5.2 mGy Kerma Total fluoroscopy time: 17 seconds. COMPARISON:  Modified speech swallow study 06/06/2023 and 05/25/2023; PET-CT 04/14/2023; CT chest 05/05/2022 FINDINGS: Prior to the start of the procedure, the patient's history was reviewed. Speech pathology report from modified barium swallow study 06/06/2023 stated the patient was able to eat solid foods without penetration or aspiration. There was penetration without aspiration with honey thick nectar liquids. There was aspiration of thin liquids without reflex for coughing or regurgitation for clearance. Therefore, the current study was initiated with caution. The patient swallowed a sip of thin barium and penetration was seen to the level of the vocal cords. He describes a feeling of mucous in his throat. There was no immediate cough reflex. I asked the patient to dry swallow and try to clear the thin liquid. Eventually the liquid above the vocal cords diminished at subsequent images. On one more sip of thin liquid barium, penetration was again seen with trace aspiration below the vocal cords. The patient's voice sounded wet. Therefore, this esophagram study was terminated. IMPRESSION: Penetration with thin liquid barium to the level of the vocal cords. Trace aspiration below the vocal cords. Therefore, the rest of this esophagram study could not be completed. Electronically Signed   By: Neita Garnet M.D.   On: 06/09/2023 11:43   DG SWALLOW FUNC OP MEDICARE SPEECH PATH Result Date: 06/06/2023 Table formatting from the original result was not included. Modified Barium Swallow Study Patient Details Name:  Jesus [name] MRN: [id number] Date of Birth: 09/01/1955 Today's Date: 06/06/2023 HPI/PMH: HPI: Mr. Rochford is a 68 year old male referred by outpatient speech pathologist for repeat MBS study.  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  Patient with peritracheal node mets right neck lymph node biopsied 05/25/2022 showed the metastatic keratinizing squamous cell carcinoma.  He also has history of diffuse B-cell lymphoma, chronic pain, GERD, diabetes mellitus, anemia, dehydration, cocaine use, EtOH, tobacco use, restless leg syndrome, and hypokalemia, now reporting worsening dysphagia.  Patient saw ENT Dr. Pollyann Kennedy 06/01/2023 who scoped him and noted he had diffuse supraglottic edema, fixed left vocal cord, impaired motility of the right cord, after which he was diagnosed with pharyngeal esophageal dysphagia.  Dr. Lucky Rathke note also indicates patient with lower anterior neck  being hard and full and notes excess secretions and wet voice.Patient reports weight loss due to dysphagia and excessive secretions starting in November 2024.  He endorses weight loss of 15 pounds in last month and is undergoing immunotherapy at this time.  Dr. Lucky Rathke note indicates patient may need feeding tube but patient wishes to forego this if at all possible.  SLP saw patient September 2024 showing minimal aspiration of thin liquid mixed with secretions due to decreased laryngeal closure and elevation but strong pharyngeal swallow.  In September 2024 patient reported he turned his head to the right to help things clear into his esophagus but now states that is not helpful anymore.  He has used nystatin in the past he is not on a PPI.  Had question if patient had osteophytes and some impairment near C5 and C6 in cervical esophagus.  In September 2024 he sensed food and pills sticking was  noted to have tracheal deviation to the right from his left paratracheal enlarged lymph node on April 2024 chest x-ray and also had complaint of neck spasms.  Patient reports that he thought he was going to need the Heimlich maneuver couple days ago when he choked on cracker and also endorses difficulties with getting Ensure down.  PET scan 04/18/2023 showed decreased in size and hyper metabolic activity at right level 2 cervical node mets, left peritracheal mass appears slightly larger but does have some central necrosis and increased focal activity along the right arytenoid cartilage not corresponding with recent CT per rad note. Clinical Impression: Clinical Impression: Patient presents with severe pharyngo-cervical esophageal dysphagia.  Decreased opening of PES region allows backflow of barium with subsequent penetration and aspiration of backflowed liquid material and secretions.  Reclined to approximately 45* allows most backflowed barium to be retained in posterior pharynx to prevent aspiration. Pt is able transit backflowed material with sequential dry swallows.  Liquid wash helps to decrease cervical esophageal retention of very small bites of solid/pudding.   His proximal and mid-pharyngeal motility as well as laryngeal closure appears adequate for po intake. Recommend refer back to ENT to determine if surgical intervention to cervical esophageal would mitigate his dyshagia and allow po intake for nutrition and hydration.  Pt expresses he does not want a feeding tube if at all possible.   In the interim time, advise pt maximize his liquid nutrition for swallow efficiency in reclined position.  Follow solids with liquids and conduct dry swallows across all po trials.  Advised pt to recommendations using teach back.  Thanks for this referral. Factors that may increase risk of adverse event in presence of aspiration Rubye Oaks & Clearance Coots 2021): No data recorded Recommendations/Plan: Swallowing Evaluation  Recommendations Swallowing Evaluation Recommendations Recommendations: PO diet (pending follow up with MD re: options for potential proximal esophageal intervention) PO Diet Recommendation: Dysphagia 3 (Mechanical soft); Thin liquids (Level 0) Liquid Administration via: Cup; Straw Medication Administration: -- (suspension or crushed with liquids  - ? V8 if not contraindicated) Supervision: Patient able to self-feed Swallowing strategies  : Small bites/sips; Multiple dry swallows after each bite/sip; Follow solids with liquids (HOB reclined to approx 45*) Postural changes: Stay upright 30-60 min after meals; Out of bed for meals; Partially reclined for meals Oral care recommendations: Oral care QID (4x/day) Recommended consults: Other(comment) (follow up with ENT) Treatment Plan No data recorded Recommendations Recommendations for follow up therapy are one component of a multi-disciplinary discharge planning process, led by the attending physician.  Recommendations may be updated based  on patient status, additional functional criteria and insurance authorization. Assessment: Orofacial Exam: Orofacial Exam Oral Cavity: Oral Hygiene: WFL Oral Cavity - Dentition: Adequate natural dentition Orofacial Anatomy: WFL Oral Motor/Sensory Function: WFL Anatomy: Anatomy: Other (Comment) (Appearance of poor clearance through proximal esophagus with severe backflow as primary source of dysphagia) Boluses Administered: Boluses Administered Boluses Administered: Thin liquids (Level 0); Mildly thick liquids (Level 2, nectar thick); Puree; Solid  Oral Impairment Domain: Oral Impairment Domain Lip Closure: No labial escape Tongue control during bolus hold: Cohesive bolus between tongue to palatal seal Bolus preparation/mastication: Timely and efficient chewing and mashing Bolus transport/lingual motion: Brisk tongue motion Oral residue: Trace residue lining oral structures Location of oral residue : Tongue Initiation of pharyngeal  swallow : Valleculae  Pharyngeal Impairment Domain: Pharyngeal Impairment Domain Soft palate elevation: No bolus between soft palate (SP)/pharyngeal wall (PW) Laryngeal elevation: Partial superior movement of thyroid cartilage/partial approximation of arytenoids to epiglottic petiole Anterior hyoid excursion: Partial anterior movement Epiglottic movement: Partial inversion Laryngeal vestibule closure: Incomplete, narrow column air/contrast in laryngeal vestibule Pharyngeal stripping wave : Present - complete Pharyngeal contraction (A/P view only): N/A Pharyngoesophageal segment opening: Minimal distention/minimal duration, marked obstruction of flow Tongue base retraction: Trace column of contrast or air between tongue base and PPW Pharyngeal residue: Trace residue within or on pharyngeal structures Location of pharyngeal residue: Valleculae; Aryepiglottic folds; Pyriform sinuses  Esophageal Impairment Domain: Esophageal Impairment Domain Esophageal clearance upright position: -- (proximal retention noted, distally appeared with trace amount) Pill: Pill Consistency administered: -- (DNT) Penetration/Aspiration Scale Score: Penetration/Aspiration Scale Score 1.  Material does not enter airway: Puree; Solid 2.  Material enters airway, remains ABOVE vocal cords then ejected out: Mildly thick liquids (Level 2, nectar thick) 8.  Material enters airway, passes BELOW cords without attempt by patient to eject out (silent aspiration) : Thin liquids (Level 0) Compensatory Strategies: Compensatory Strategies Compensatory strategies: Yes Effortful swallow: Ineffective Ineffective Effortful Swallow: Thin liquid (Level 0) Multiple swallows: Effective Effective Multiple Swallows: Thin liquid (Level 0); Mildly thick liquid (Level 2, nectar thick) Liquid wash: Effective; Ineffective Effective Liquid Wash: Puree; Solid (partially effective) Reclining posture: Effective (to keep barium in posterior pharynx - and prevent him from  aspirating) Effective Reclining Posture: Thin liquid (Level 0); Mildly thick liquid (Level 2, nectar thick) Posterior head tilt: Effective Effective Posterior head tilt: Thin liquid (Level 0); Mildly thick liquid (Level 2, nectar thick)   General Information: Caregiver present: No  Diet Prior to this Study: Regular; Thin liquids (Level 0)   Temperature : Normal   Respiratory Status: WFL   Supplemental O2: None (Room air)   History of Recent Intubation: No  Behavior/Cognition: Alert; Cooperative; Pleasant mood Self-Feeding Abilities: Able to self-feed Baseline vocal quality/speech: Dysphonic; Other (comment) (wet voice) Volitional Cough: Able to elicit Volitional Swallow: Able to elicit Exam Limitations: No limitations Goal Planning: No data recorded No data recorded No data recorded No data recorded No data recorded Pain: Pain Assessment Pain Assessment: No/denies pain End of Session: Start Time:SLP Start Time (ACUTE ONLY): 1350 Stop Time: SLP Stop Time (ACUTE ONLY): 1435 Time Calculation:SLP Time Calculation (min) (ACUTE ONLY): 45 min Charges: SLP Evaluations $ SLP Speech Visit: 1 Visit SLP Evaluations $Outpatient MBS Swallow: 1 Procedure $Swallowing Treatment: 1 Procedure SLP visit diagnosis: SLP Visit Diagnosis: Dysphagia, pharyngoesophageal phase (R13.14) Past Medical History: Past Medical History: Diagnosis Date  Allergy   Anemia   during chemo  Arthritis   knee   Blood transfusion without reported diagnosis   Cancer (  HCC)   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- last radiation 06-2018  Cataract   removed both eyes with l;ens implants   COPD (chronic obstructive pulmonary disease) (HCC)   Family history of colon cancer   in his brother- dx'd age 34   History of chemotherapy   last 06-01-2018  History of kidney stones   History of radiation therapy   last radiation 06-2018  Hyperlipidemia   currently under control  Hypertension   Irregular heart beats   Lymphadenopathy   Pain,  lower leg   Bilateral  Peripheral arterial disease (HCC)   Pre-diabetes   Red-green color blindness   RLS (restless legs syndrome)   Snores   Wears glasses  Past Surgical History: Past Surgical History: Procedure Laterality Date  CATARACT EXTRACTION W/ INTRAOCULAR LENS  IMPLANT, BILATERAL    COLONOSCOPY    DIRECT LARYNGOSCOPY Right 02/03/2022  Procedure: DIRECT LARYNGOSCOPY WITH BIOPSY OF RIGHT FALSE VOCAL CORD;  Surgeon: Osborn Coho, MD;  Location[facility];  Service: ENT;  Laterality: Right;  dislodged salava stone    FRACTURE SURGERY    HAND ARTHROPLASTY  1995  crushed left hand  INGUINAL LYMPH NODE BIOPSY Left 01/02/2018  Procedure: LEFT INGUINAL LYMPH NODE BIOPSY;  Surgeon: Emelia Loron, MD;  Location: [facility] OR;  Service: General;  Laterality: Left;  IR IMAGING GUIDED PORT INSERTION  01/15/2018  IR IMAGING GUIDED PORT INSERTION  08/10/2022  IR REMOVAL TUN ACCESS W/ PORT W/O FL MOD SED  03/11/2019  MEDIASTINOSCOPY N/A 07/22/2022  Procedure: MEDIASTINOSCOPY;  Surgeon: Loreli Slot, MD;  Location: [facility] OR;  Service: Thoracic;  Laterality: N/A;  MICROLARYNGOSCOPY Left 01/17/2014  Procedure: MICROLARYNGOSCOPY WITH EXCISION OF THE BIOPSY OF LEFT VOCAL CORD LESION;  Surgeon: Serena Colonel, MD;  Location: [facility];  Service: ENT;  Laterality: Left;  MICROLARYNGOSCOPY N/A 09/16/2020  Procedure: MICROLARYNGOSCOPY with Biopsy of vocal cord lesion;  Surgeon: Osborn Coho, MD;  Location[facility] OR;  Service: ENT;  Laterality: N/A;  ORIF FOOT FRACTURE  2005  left  REFRACTIVE SURGERY Right   removed cloudiness in right eye after cataract removal  Rolena Infante, MS [facility] SLP Acute Rehab Services Office [phone] Chales Abrahams 06/06/2023, 4:30 PM CLINICAL DATA:  Dysphagia. Cough/GE reflux disease/other secondary diagnosis EXAM: MODIFIED BARIUM SWALLOW TECHNIQUE: Different consistencies of barium were administered orally to the patient by the Speech Pathologist. Imaging of the pharynx was performed in the  lateral projection. The radiologist was present in the fluoroscopy room for this study, providing personal supervision. FLUOROSCOPY TIME:  Radiation Exposure Index (as provided by the fluoroscopic device): 5.9 mGy Kerma COMPARISON:  None Available. FINDINGS: Fluoroscopic guidance was provided while modified barium swallow was performed by the speech pathologist. Please refer to the Speech Pathology report for results and recommendations. IMPRESSION: Please refer to the Speech Pathologists report for complete details and recommendations. Electronically Signed   By: Lupita Raider M.D.   On: 06/06/2023 15:24    Subjective: Patient seen examined bedside, sitting in bedside chair.  No complaints this morning.  Ready for discharge home.  Tolerating bolus tube feeds.  No other specific questions, concerns or complaints at this time.  Denies headache, no dizziness, no chest pain, no palpitations, no shortness of breath, no abdominal pain, no fever/chills/night sweats, no nausea/vomiting/diarrhea, no focal weakness, no fatigue, no paresthesias.  No acute events overnight per nursing staff.  Discharge Exam: Vitals:   06/23/23 2200 06/24/23 0451  BP: 129/63 Marland Kitchen)  144/100  Pulse: 93 94  Resp: 18 18  Temp:  98.3 F (36.8 C)  SpO2:  100%   Vitals:   06/23/23 1000 06/23/23 1424 06/23/23 2200 06/24/23 0451  BP:  135/87 129/63 (!) 144/100  Pulse:  (!) 102 93 94  Resp:  18 18 18   Temp: 98.9 F (37.2 C) 99.1 F (37.3 C)  98.3 F (36.8 C)  TempSrc:  Oral Oral Oral  SpO2:  98%  100%  Weight:      Height:        Physical Exam: GEN: NAD, alert and oriented x 3, wd/wn HEENT: NCAT, PERRL, EOMI, sclera clear, MMM PULM: CTAB w/o wheezes/crackles, normal respiratory effort, on room air CV: RRR w/o M/G/R GI: abd soft, NTND, NABS, no R/G/M, G-tube noted MSK: no peripheral edema, muscle strength globally intact 5/5 bilateral upper/lower extremities NEURO: CN II-XII intact, no focal deficits, sensation to  light touch intact PSYCH: normal mood/affect Integumentary: dry/intact, no rashes or wounds    The results of significant diagnostics from this hospitalization (including imaging, microbiology, ancillary and laboratory) are listed below for reference.     Microbiology: Recent Results (from the past 240 hours)  Resp panel by RT-PCR (RSV, Flu A&B, Covid) Anterior Nasal Swab     Status: None   Collection Time: 06/15/23  7:16 PM   Specimen: Anterior Nasal Swab  Result Value Ref Range Status   SARS Coronavirus 2 by RT PCR NEGATIVE NEGATIVE Final    Comment: (NOTE) SARS-CoV-2 target nucleic acids are NOT DETECTED.  The SARS-CoV-2 RNA is generally detectable in upper respiratory specimens during the acute phase of infection. The lowest concentration of SARS-CoV-2 viral copies this assay can detect is 138 copies/mL. A negative result does not preclude SARS-Cov-2 infection and should not be used as the sole basis for treatment or other patient management decisions. A negative result may occur with  improper specimen collection/handling, submission of specimen other than nasopharyngeal swab, presence of viral mutation(s) within the areas targeted by this assay, and inadequate number of viral copies(<138 copies/mL). A negative result must be combined with clinical observations, patient history, and epidemiological information. The expected result is Negative.  Fact Sheet for Patients:  BloggerCourse.com  Fact Sheet for Healthcare Providers:  SeriousBroker.it  This test is no t yet approved or cleared by the [place] FDA and  has been authorized for detection and/or diagnosis of SARS-CoV-2 by FDA under an Emergency Use Authorization (EUA). This EUA will remain  in effect (meaning this test can be used) for the duration of the COVID-19 declaration under Section 564(b)(1) of the Act, 21 U.S.C.section 360bbb-3(b)(1), unless the  authorization is terminated  or revoked sooner.       Influenza A by PCR NEGATIVE NEGATIVE Final   Influenza B by PCR NEGATIVE NEGATIVE Final    Comment: (NOTE) The Xpert Xpress SARS-CoV-2/FLU/RSV plus assay is intended as an aid in the diagnosis of influenza from Nasopharyngeal swab specimens and should not be used as a sole basis for treatment. Nasal washings and aspirates are unacceptable for Xpert Xpress SARS-CoV-2/FLU/RSV testing.  Fact Sheet for Patients: BloggerCourse.com  Fact Sheet for Healthcare Providers: SeriousBroker.it  This test is not yet approved or cleared by the [place] FDA and has been authorized for detection and/or diagnosis of SARS-CoV-2 by FDA under an Emergency Use Authorization (EUA). This EUA will remain in effect (meaning this test can be used) for the duration of the COVID-19 declaration under Section 564(b)(1) of the Act,  21 U.S.C. section 360bbb-3(b)(1), unless the authorization is terminated or revoked.     Resp Syncytial Virus by PCR NEGATIVE NEGATIVE Final    Comment: (NOTE) Fact Sheet for Patients: BloggerCourse.com  Fact Sheet for Healthcare Providers: SeriousBroker.it  This test is not yet approved or cleared by the [place] FDA and has been authorized for detection and/or diagnosis of SARS-CoV-2 by FDA under an Emergency Use Authorization (EUA). This EUA will remain in effect (meaning this test can be used) for the duration of the COVID-19 declaration under Section 564(b)(1) of the Act, 21 U.S.C. section 360bbb-3(b)(1), unless the authorization is terminated or revoked.  Performed at [facility], [address], [place], [place] [address]   MRSA Next Gen by PCR, Nasal     Status: None   Collection Time: 06/16/23 10:03 AM   Specimen: Nasal Mucosa; Nasal Swab  Result Value Ref Range Status   MRSA by PCR  Next Gen NOT DETECTED NOT DETECTED Final    Comment: (NOTE) The GeneXpert MRSA Assay (FDA approved for NASAL specimens only), is one component of a comprehensive MRSA colonization surveillance program. It is not intended to diagnose MRSA infection nor to guide or monitor treatment for MRSA infections. Test performance is not FDA approved in patients less than 71 years old. Performed at [facility], [address], [place], [place] [address]   Surgical pcr screen     Status: None   Collection Time: 06/18/23  5:15 PM   Specimen: Nasal Mucosa; Nasal Swab  Result Value Ref Range Status   MRSA, PCR NEGATIVE NEGATIVE Final   Staphylococcus aureus NEGATIVE NEGATIVE Final    Comment: (NOTE) The Xpert SA Assay (FDA approved for NASAL specimens in patients 27 years of age and older), is one component of a comprehensive surveillance program. It is not intended to diagnose infection nor to guide or monitor treatment. Performed at [facility], [address], [place], [place] [address]      Labs: BNP (last 3 results) No results for input(s): "BNP" in the last 8760 hours. Basic Metabolic Panel: Recent Labs  Lab 06/19/23 0325 06/20/23 1845 06/20/23 1845 06/21/23 0443 06/21/23 1850 06/22/23 0353 06/23/23 0339 06/24/23 0357  NA 138  --   --  137  --  139 139 140  K 3.9  --   --  3.7  --  4.1 3.5 3.6  CL 104  --   --  102  --  103 101 103  CO2 24  --   --  26  --  28 28 31   GLUCOSE 83  --   --  165*  --  177* 171* 110*  BUN 10  --   --  19  --  16 17 16   CREATININE 0.89  --   --  0.93  --  0.92 0.88 0.79  CALCIUM 9.0  --   --  9.0  --  8.8* 8.9 8.4*  MG  --  1.7   < > 1.9 1.5* 1.7 1.6* 1.7  PHOS  --  2.4*  --  2.3* 3.2 3.4 2.6  --    < > = values in this interval not displayed.   Liver Function Tests: Recent Labs  Lab 06/21/23 0443 06/22/23 0353  AST 15 15  ALT 12 11  ALKPHOS 56 54  BILITOT 0.6 0.4  PROT 7.7 7.4  ALBUMIN 3.0* 2.8*    No results for input(s): "LIPASE", "AMYLASE" in the  last 168 hours. No results for input(s): "AMMONIA" in the last 168 hours. CBC: Recent Labs  Lab 06/18/23 0338 06/19/23 0325 06/20/23 0342 06/21/23 0443 06/22/23 0353  WBC 6.2 7.2 5.9 7.7 5.9  HGB 6.9* 8.4* 7.7* 8.3* 8.1*  HCT 23.2* 27.6* 25.7* 27.8* 27.6*  MCV 85.6 84.7 84.5 85.3 86.5  PLT 478* 494* 470* 516* 458*   Cardiac Enzymes: No results for input(s): "CKTOTAL", "CKMB", "CKMBINDEX", "TROPONINI" in the last 168 hours. BNP: Invalid input(s): "POCBNP" CBG: Recent Labs  Lab 06/23/23 1645 06/23/23 1949 06/23/23 2340 06/24/23 0339 06/24/23 0757  GLUCAP 171* 141* 208* 109* 129*   D-Dimer No results for input(s): "DDIMER" in the last 72 hours. Hgb A1c No results for input(s): "HGBA1C" in the last 72 hours. Lipid Profile No results for input(s): "CHOL", "HDL", "LDLCALC", "TRIG", "CHOLHDL", "LDLDIRECT" in the last 72 hours. Thyroid function studies No results for input(s): "TSH", "T4TOTAL", "T3FREE", "THYROIDAB" in the last 72 hours.  Invalid input(s): "FREET3" Anemia work up No results for input(s): "VITAMINB12", "FOLATE", "FERRITIN", "TIBC", "IRON", "RETICCTPCT" in the last 72 hours. Urinalysis    Component Value Date/Time   COLORURINE YELLOW 08/19/2022 1248   APPEARANCEUR CLEAR 08/19/2022 1248   LABSPEC 1.021 08/19/2022 1248   PHURINE 5.0 08/19/2022 1248   GLUCOSEU 50 (A) 08/19/2022 1248   HGBUR NEGATIVE 08/19/2022 1248   BILIRUBINUR NEGATIVE 08/19/2022 1248   BILIRUBINUR negative 12/22/2017 1545   KETONESUR NEGATIVE 08/19/2022 1248   PROTEINUR 30 (A) 08/19/2022 1248   UROBILINOGEN 1.0 12/22/2017 1545   NITRITE NEGATIVE 08/19/2022 1248   LEUKOCYTESUR NEGATIVE 08/19/2022 1248   Sepsis Labs Recent Labs  Lab 06/19/23 0325 06/20/23 0342 06/21/23 0443 06/22/23 0353  WBC 7.2 5.9 7.7 5.9   Microbiology Recent Results (from the past 240 hours)  Resp panel by RT-PCR (RSV, Flu A&B, Covid) Anterior Nasal  Swab     Status: None   Collection Time: 06/15/23  7:16 PM   Specimen: Anterior Nasal Swab  Result Value Ref Range Status   SARS Coronavirus 2 by RT PCR NEGATIVE NEGATIVE Final    Comment: (NOTE) SARS-CoV-2 target nucleic acids are NOT DETECTED.  The SARS-CoV-2 RNA is generally detectable in upper respiratory specimens during the acute phase of infection. The lowest concentration of SARS-CoV-2 viral copies this assay can detect is 138 copies/mL. A negative result does not preclude SARS-Cov-2 infection and should not be used as the sole basis for treatment or other patient management decisions. A negative result may occur with  improper specimen collection/handling, submission of specimen other than nasopharyngeal swab, presence of viral mutation(s) within the areas targeted by this assay, and inadequate number of viral copies(<138 copies/mL). A negative result must be combined with clinical observations, patient history, and epidemiological information. The expected result is Negative.  Fact Sheet for Patients:  BloggerCourse.com  Fact Sheet for Healthcare Providers:  SeriousBroker.it  This test is no t yet approved or cleared by the [place] FDA and  has been authorized for detection and/or diagnosis of SARS-CoV-2 by FDA under an Emergency Use Authorization (EUA). This EUA will remain  in effect (meaning this test can be used) for the duration of the COVID-19 declaration under Section 564(b)(1) of the Act, 21 U.S.C.section 360bbb-3(b)(1), unless the authorization is terminated  or revoked sooner.       Influenza A by PCR NEGATIVE NEGATIVE Final   Influenza B by PCR NEGATIVE NEGATIVE Final    Comment: (NOTE) The Xpert Xpress SARS-CoV-2/FLU/RSV plus assay is intended as an  aid in the diagnosis of influenza from Nasopharyngeal swab specimens and should not be used as a sole basis for treatment. Nasal washings and aspirates  are unacceptable for Xpert Xpress SARS-CoV-2/FLU/RSV testing.  Fact Sheet for Patients: BloggerCourse.com  Fact Sheet for Healthcare Providers: SeriousBroker.it  This test is not yet approved or cleared by the [place] FDA and has been authorized for detection and/or diagnosis of SARS-CoV-2 by FDA under an Emergency Use Authorization (EUA). This EUA will remain in effect (meaning this test can be used) for the duration of the COVID-19 declaration under Section 564(b)(1) of the Act, 21 U.S.C. section 360bbb-3(b)(1), unless the authorization is terminated or revoked.     Resp Syncytial Virus by PCR NEGATIVE NEGATIVE Final    Comment: (NOTE) Fact Sheet for Patients: BloggerCourse.com  Fact Sheet for Healthcare Providers: SeriousBroker.it  This test is not yet approved or cleared by the [place] FDA and has been authorized for detection and/or diagnosis of SARS-CoV-2 by FDA under an Emergency Use Authorization (EUA). This EUA will remain in effect (meaning this test can be used) for the duration of the COVID-19 declaration under Section 564(b)(1) of the Act, 21 U.S.C. section 360bbb-3(b)(1), unless the authorization is terminated or revoked.  Performed at [facility], [address], [place], [place] [address]   MRSA Next Gen by PCR, Nasal     Status: None   Collection Time: 06/16/23 10:03 AM   Specimen: Nasal Mucosa; Nasal Swab  Result Value Ref Range Status   MRSA by PCR Next Gen NOT DETECTED NOT DETECTED Final    Comment: (NOTE) The GeneXpert MRSA Assay (FDA approved for NASAL specimens only), is one component of a comprehensive MRSA colonization surveillance program. It is not intended to diagnose MRSA infection nor to guide or monitor treatment for MRSA infections. Test performance is not FDA approved in patients less than 64  years old. Performed at [facility], [address], [place], [place] [address]   Surgical pcr screen     Status: None   Collection Time: 06/18/23  5:15 PM   Specimen: Nasal Mucosa; Nasal Swab  Result Value Ref Range Status   MRSA, PCR NEGATIVE NEGATIVE Final   Staphylococcus aureus NEGATIVE NEGATIVE Final    Comment: (NOTE) The Xpert SA Assay (FDA approved for NASAL specimens in patients 63 years of age and older), is one component of a comprehensive surveillance program. It is not intended to diagnose infection nor to guide or monitor treatment. Performed at [facility], [address], [place], [place] [address]      Time coordinating discharge: Over 30 minutes  SIGNED:   Alvira Philips [place], DO  [facility] 06/24/2023, 11:09 AM

## 2023-06-24 NOTE — TOC Transition Note (Signed)
Transition of Care [facility]) - Discharge Note   Patient Details  Name: Jesus [name] MRN: [id number] Date of Birth: June 16, 1955  Transition of Care [facility]) CM/SW Contact:  Amada Jupiter, LCSW Phone Number: 06/24/2023, 11:29 AM   Clinical Narrative:     Pt medically cleared for dc home today.  Tube feeding supplies all set with [employer] and HHRN arranged with [facility].  No further TOC needs.     Barriers to Discharge: Continued Medical Work up   Patient Goals and CMS Choice Patient states their goals for this hospitalization and ongoing recovery are:: return home          Discharge Placement                       Discharge Plan and Services Additional resources added to the After Visit Summary for   In-house Referral: Clinical Social Work              DME Arranged: Tamsen Snider feeding DME Agency: Other - Comment Julianne Rice) Date DME Agency Contacted: 06/23/23 Time DME Agency Contacted: [phone] Representative spoke with at DME Agency: Jeri Modena, RN HH Arranged: RN HH Agency: [facility] Date [facility] Agency Contacted: 06/23/23 Time HH Agency Contacted: [phone] Representative spoke with at [facility] Agency: Kandee Keen  Social Drivers of Health (SDOH) Interventions SDOH Screenings   Food Insecurity: No Food Insecurity (06/16/2023)  Housing: Low Risk  (06/16/2023)  Transportation Needs: No Transportation Needs (06/16/2023)  Utilities: Not At Risk (06/16/2023)  Depression (PHQ2-9): Low Risk  (11/09/2022)  Financial Resource Strain: Low Risk  (10/06/2020)  Social Connections: Moderately Isolated (06/16/2023)  Tobacco Use: Medium Risk (06/21/2023)     Readmission Risk Interventions    06/21/2023   10:14 AM  Readmission Risk Prevention Plan  Transportation Screening Complete  PCP or Specialist Appt within 3-5 Days Complete  HRI or Home Care Consult Complete  Social Work Consult for Recovery Care Planning/Counseling Complete  Palliative Care Screening Not Applicable  Medication Review Special educational needs teacher) Complete

## 2023-06-25 DIAGNOSIS — C760 Malignant neoplasm of head, face and neck: Secondary | ICD-10-CM | POA: Diagnosis not present

## 2023-06-25 DIAGNOSIS — K9423 Gastrostomy malfunction: Secondary | ICD-10-CM | POA: Diagnosis not present

## 2023-06-25 DIAGNOSIS — Z452 Encounter for adjustment and management of vascular access device: Secondary | ICD-10-CM | POA: Diagnosis not present

## 2023-06-25 DIAGNOSIS — D6481 Anemia due to antineoplastic chemotherapy: Secondary | ICD-10-CM | POA: Diagnosis not present

## 2023-06-25 DIAGNOSIS — C771 Secondary and unspecified malignant neoplasm of intrathoracic lymph nodes: Secondary | ICD-10-CM | POA: Diagnosis not present

## 2023-06-25 DIAGNOSIS — E1151 Type 2 diabetes mellitus with diabetic peripheral angiopathy without gangrene: Secondary | ICD-10-CM | POA: Diagnosis not present

## 2023-06-25 DIAGNOSIS — G8929 Other chronic pain: Secondary | ICD-10-CM | POA: Diagnosis not present

## 2023-06-25 DIAGNOSIS — T451X5D Adverse effect of antineoplastic and immunosuppressive drugs, subsequent encounter: Secondary | ICD-10-CM | POA: Diagnosis not present

## 2023-06-25 DIAGNOSIS — K219 Gastro-esophageal reflux disease without esophagitis: Secondary | ICD-10-CM | POA: Diagnosis not present

## 2023-06-25 DIAGNOSIS — Z96632 Presence of left artificial wrist joint: Secondary | ICD-10-CM | POA: Diagnosis not present

## 2023-06-25 DIAGNOSIS — D630 Anemia in neoplastic disease: Secondary | ICD-10-CM | POA: Diagnosis not present

## 2023-06-25 DIAGNOSIS — J440 Chronic obstructive pulmonary disease with acute lower respiratory infection: Secondary | ICD-10-CM | POA: Diagnosis not present

## 2023-06-25 DIAGNOSIS — Z87442 Personal history of urinary calculi: Secondary | ICD-10-CM | POA: Diagnosis not present

## 2023-06-25 DIAGNOSIS — E782 Mixed hyperlipidemia: Secondary | ICD-10-CM | POA: Diagnosis not present

## 2023-06-25 DIAGNOSIS — C320 Malignant neoplasm of glottis: Secondary | ICD-10-CM | POA: Diagnosis not present

## 2023-06-25 DIAGNOSIS — G2581 Restless legs syndrome: Secondary | ICD-10-CM | POA: Diagnosis not present

## 2023-06-25 DIAGNOSIS — J189 Pneumonia, unspecified organism: Secondary | ICD-10-CM | POA: Diagnosis not present

## 2023-06-25 DIAGNOSIS — M2578 Osteophyte, vertebrae: Secondary | ICD-10-CM | POA: Diagnosis not present

## 2023-06-25 DIAGNOSIS — R1314 Dysphagia, pharyngoesophageal phase: Secondary | ICD-10-CM | POA: Diagnosis not present

## 2023-06-25 DIAGNOSIS — E43 Unspecified severe protein-calorie malnutrition: Secondary | ICD-10-CM | POA: Diagnosis not present

## 2023-06-25 DIAGNOSIS — C8330 Diffuse large B-cell lymphoma, unspecified site: Secondary | ICD-10-CM | POA: Diagnosis not present

## 2023-06-25 DIAGNOSIS — Z792 Long term (current) use of antibiotics: Secondary | ICD-10-CM | POA: Diagnosis not present

## 2023-06-25 DIAGNOSIS — Z87891 Personal history of nicotine dependence: Secondary | ICD-10-CM | POA: Diagnosis not present

## 2023-06-25 DIAGNOSIS — I10 Essential (primary) hypertension: Secondary | ICD-10-CM | POA: Diagnosis not present

## 2023-06-26 ENCOUNTER — Inpatient Hospital Stay: Payer: PPO

## 2023-06-26 ENCOUNTER — Ambulatory Visit (HOSPITAL_COMMUNITY)
Admission: RE | Admit: 2023-06-26 | Discharge: 2023-06-26 | Disposition: A | Discharge status: Home / Self Care | Payer: PPO | Source: Ambulatory Visit | Attending: Podiatry | Admitting: Podiatry

## 2023-06-26 ENCOUNTER — Inpatient Hospital Stay: Payer: PPO | Admitting: Hematology

## 2023-06-26 DIAGNOSIS — I739 Peripheral vascular disease, unspecified: Secondary | ICD-10-CM | POA: Diagnosis present

## 2023-06-26 MED ORDER — NUTREN 1.5 EN LIQD: ENTERAL | Status: DC

## 2023-06-26 NOTE — Progress Notes (Addendum)
Nutrition Assessment   Reason for Assessment:   New feeding tube   ASSESSMENT:  68 year old male with metastatic laryngeal SCC  stage IV, T cell/large B-cell lymphoma (Dx 2019), currently on [place] with Dr Candise Che.  Past medical history of HTN, HLD.   Admitted to hospital on 2/6 unable to eat or drink. G tube placed on 06/21/2023 due to dysphagia from laryngeal cancer.    Met with patient in clinic.  Came early (10:30 am) than appointment at 3:15pm.  RD worked patient in.  Reports that he is not able to take in anything by mouth since hospital discharge. Prior to admission decreased intake since September 2024.   Spitting out secretions in emesis bag during visit.  Has been increasing tube feeding of nutren 1.5 ([employer]).  Reports that he took 5 cartons yesterday.  Planning to take 6 cartons today via tube (able to take 1 1/2 cartons this am without difficulty and has been successful with 2 cartons at a feeding).  Has been giving 30ml of water before and after each feeding.  Also has been giving 60ml of water 6 times a day for extra water. He is not able to drink ensure max protein at this time as planned during hospital admission.     Nutrition Focused Physical Exam:   Orbital Region: mild Buccal Region: mild Upper Arm Region: normal Thoracic and Lumbar Region: normal Temple Region: mild Clavicle Bone Region: normal Shoulder and Acromion Bone Region: normal Scapular Bone Region: normal Dorsal Hand: mild Patellar Region: mild Anterior Thigh Region: mild Posterior Calf Region: normal Edema (RD assessment): none Hair: observed Eyes: observed Mouth: observed Skin: observed Nails: observed   Medications: liquid, oxycodone, robinul   Labs: reviewed from 2/15   Anthropometrics:   Height: 72  inches Weight: 240 lb in RD office UBW: 300 lb about 8 months ago per report 303 lb 09/26/22 BMI: 32  20% weight loss in the last 9 months   Estimated Energy Needs  Kcals:  2725-3270 Protein: 109-163 g Fluid: 2725-3270 ml   NUTRITION DIAGNOSIS: Inadequate oral intake related to laryngeal cancer causing dysphagia as evidenced by 20% weight loss in the last 8 months and decreased ability to eat orally for > 1 month   MALNUTRITION DIAGNOSIS: Patient meets criteria for severe malnutrition in context of chronic illness as evidenced by 20% weight loss in the last 8 months and eating less that 75% of estimated energy needs for greater than 1 month   INTERVENTION:  Since patient is not able to drink 1 carton of ensure max protein as planned during admission recommend 8 cartons of nutren 1.5 via feeding tube to better meet nutritional needs.  (2 cartons 4 times a day) Flush with 30ml water before and after each feeding.  Will need additional water flush of TID between feedings for additional hydration.   Provides 3000 calories, 136 g protein, 2848 ml free water Written instructions giving to patient for titration each day.  Patient verbalized understanding.  Meets 100% of calorie needs Anticipate long term need of tube feeding. Contacted Pam, RN with [employer] regarding tube feeding order change.     MONITORING, EVALUATION, GOAL: Weight trends, tube feeding   Next Visit: Tuesday, Feb 25 during infusion  Jesus [name] B. Freida Busman, RD, LDN Registered Dietitian [phone]

## 2023-06-27 ENCOUNTER — Ambulatory Visit: Payer: PPO

## 2023-06-27 ENCOUNTER — Encounter: Payer: Self-pay | Admitting: Podiatry

## 2023-06-28 LAB — VAS US ABI WITH/WO TBI
Left ABI: 1.38
Right ABI: 1.26

## 2023-06-29 NOTE — Progress Notes (Addendum)
Nutrition  Received message to contact Home Health RN [phone]) to clarify tube feeding orders.    Called and left message for RN with call back number.   RD spoke with Elita Quick, representative with [employer] and Pam will send new tube feeding orders to Home Health RN.     Received call back from Home Health RN at 11:25 am today saying that she had received new orders from [employer].    Taryn Nave B. Freida Busman, RD, LDN Registered Dietitian [phone]

## 2023-07-02 ENCOUNTER — Emergency Department (HOSPITAL_COMMUNITY): Payer: PPO

## 2023-07-02 ENCOUNTER — Other Ambulatory Visit: Payer: Self-pay

## 2023-07-02 ENCOUNTER — Encounter (HOSPITAL_COMMUNITY): Payer: Self-pay

## 2023-07-02 ENCOUNTER — Inpatient Hospital Stay (HOSPITAL_COMMUNITY)
Admission: EM | Admit: 2023-07-02 | Discharge: 2023-07-05 | DRG: 190 | Disposition: A | Discharge status: Home / Self Care | Payer: PPO | Attending: Internal Medicine | Admitting: Internal Medicine

## 2023-07-02 DIAGNOSIS — E875 Hyperkalemia: Secondary | ICD-10-CM | POA: Diagnosis present

## 2023-07-02 DIAGNOSIS — I2489 Other forms of acute ischemic heart disease: Secondary | ICD-10-CM | POA: Diagnosis not present

## 2023-07-02 DIAGNOSIS — R739 Hyperglycemia, unspecified: Secondary | ICD-10-CM

## 2023-07-02 DIAGNOSIS — R1310 Dysphagia, unspecified: Secondary | ICD-10-CM | POA: Diagnosis not present

## 2023-07-02 DIAGNOSIS — H5353 Deuteranomaly: Secondary | ICD-10-CM | POA: Diagnosis present

## 2023-07-02 DIAGNOSIS — K219 Gastro-esophageal reflux disease without esophagitis: Secondary | ICD-10-CM | POA: Diagnosis present

## 2023-07-02 DIAGNOSIS — Z961 Presence of intraocular lens: Secondary | ICD-10-CM | POA: Diagnosis present

## 2023-07-02 DIAGNOSIS — R6510 Systemic inflammatory response syndrome (SIRS) of non-infectious origin without acute organ dysfunction: Secondary | ICD-10-CM | POA: Diagnosis not present

## 2023-07-02 DIAGNOSIS — R0603 Acute respiratory distress: Secondary | ICD-10-CM | POA: Diagnosis present

## 2023-07-02 DIAGNOSIS — R7989 Other specified abnormal findings of blood chemistry: Secondary | ICD-10-CM | POA: Diagnosis not present

## 2023-07-02 DIAGNOSIS — Z87891 Personal history of nicotine dependence: Secondary | ICD-10-CM | POA: Diagnosis not present

## 2023-07-02 DIAGNOSIS — Z800 Family history of malignant neoplasm of digestive organs: Secondary | ICD-10-CM

## 2023-07-02 DIAGNOSIS — Z8572 Personal history of non-Hodgkin lymphomas: Secondary | ICD-10-CM | POA: Diagnosis not present

## 2023-07-02 DIAGNOSIS — Z8521 Personal history of malignant neoplasm of larynx: Secondary | ICD-10-CM

## 2023-07-02 DIAGNOSIS — Z1152 Encounter for screening for COVID-19: Secondary | ICD-10-CM

## 2023-07-02 DIAGNOSIS — Z9221 Personal history of antineoplastic chemotherapy: Secondary | ICD-10-CM | POA: Diagnosis not present

## 2023-07-02 DIAGNOSIS — J9601 Acute respiratory failure with hypoxia: Secondary | ICD-10-CM | POA: Diagnosis not present

## 2023-07-02 DIAGNOSIS — G2581 Restless legs syndrome: Secondary | ICD-10-CM | POA: Diagnosis not present

## 2023-07-02 DIAGNOSIS — A419 Sepsis, unspecified organism: Secondary | ICD-10-CM

## 2023-07-02 DIAGNOSIS — R0602 Shortness of breath: Secondary | ICD-10-CM | POA: Diagnosis present

## 2023-07-02 DIAGNOSIS — I10 Essential (primary) hypertension: Secondary | ICD-10-CM | POA: Diagnosis present

## 2023-07-02 DIAGNOSIS — Z888 Allergy status to other drugs, medicaments and biological substances status: Secondary | ICD-10-CM

## 2023-07-02 DIAGNOSIS — N179 Acute kidney failure, unspecified: Secondary | ICD-10-CM | POA: Diagnosis not present

## 2023-07-02 DIAGNOSIS — E785 Hyperlipidemia, unspecified: Secondary | ICD-10-CM | POA: Diagnosis not present

## 2023-07-02 DIAGNOSIS — Z833 Family history of diabetes mellitus: Secondary | ICD-10-CM

## 2023-07-02 DIAGNOSIS — Z803 Family history of malignant neoplasm of breast: Secondary | ICD-10-CM

## 2023-07-02 DIAGNOSIS — Z6831 Body mass index (BMI) 31.0-31.9, adult: Secondary | ICD-10-CM | POA: Diagnosis not present

## 2023-07-02 DIAGNOSIS — D638 Anemia in other chronic diseases classified elsewhere: Secondary | ICD-10-CM | POA: Diagnosis not present

## 2023-07-02 DIAGNOSIS — E1151 Type 2 diabetes mellitus with diabetic peripheral angiopathy without gangrene: Secondary | ICD-10-CM | POA: Diagnosis present

## 2023-07-02 DIAGNOSIS — Z923 Personal history of irradiation: Secondary | ICD-10-CM

## 2023-07-02 DIAGNOSIS — Z91030 Bee allergy status: Secondary | ICD-10-CM | POA: Diagnosis not present

## 2023-07-02 DIAGNOSIS — R042 Hemoptysis: Secondary | ICD-10-CM | POA: Diagnosis present

## 2023-07-02 DIAGNOSIS — C329 Malignant neoplasm of larynx, unspecified: Secondary | ICD-10-CM | POA: Diagnosis not present

## 2023-07-02 DIAGNOSIS — E669 Obesity, unspecified: Secondary | ICD-10-CM | POA: Diagnosis present

## 2023-07-02 DIAGNOSIS — Z9841 Cataract extraction status, right eye: Secondary | ICD-10-CM

## 2023-07-02 DIAGNOSIS — E8729 Other acidosis: Secondary | ICD-10-CM | POA: Diagnosis present

## 2023-07-02 DIAGNOSIS — F1411 Cocaine abuse, in remission: Secondary | ICD-10-CM

## 2023-07-02 DIAGNOSIS — Z931 Gastrostomy status: Secondary | ICD-10-CM

## 2023-07-02 DIAGNOSIS — J441 Chronic obstructive pulmonary disease with (acute) exacerbation: Principal | ICD-10-CM

## 2023-07-02 DIAGNOSIS — Z823 Family history of stroke: Secondary | ICD-10-CM

## 2023-07-02 DIAGNOSIS — E440 Moderate protein-calorie malnutrition: Secondary | ICD-10-CM | POA: Insufficient documentation

## 2023-07-02 DIAGNOSIS — R6520 Severe sepsis without septic shock: Secondary | ICD-10-CM

## 2023-07-02 DIAGNOSIS — D72829 Elevated white blood cell count, unspecified: Secondary | ICD-10-CM

## 2023-07-02 DIAGNOSIS — Z8249 Family history of ischemic heart disease and other diseases of the circulatory system: Secondary | ICD-10-CM

## 2023-07-02 DIAGNOSIS — Z9842 Cataract extraction status, left eye: Secondary | ICD-10-CM

## 2023-07-02 LAB — BRAIN NATRIURETIC PEPTIDE: B Natriuretic Peptide: 27.6 pg/mL (ref 0.0–100.0)

## 2023-07-02 LAB — CBC WITH DIFFERENTIAL/PLATELET
Abs Immature Granulocytes: 0.11 10*3/uL — ABNORMAL HIGH (ref 0.00–0.07)
Basophils Absolute: 0.1 10*3/uL (ref 0.0–0.1)
Basophils Relative: 1 %
Eosinophils Absolute: 0.1 10*3/uL (ref 0.0–0.5)
Eosinophils Relative: 0 %
HCT: 29 % — ABNORMAL LOW (ref 39.0–52.0)
Hemoglobin: 8.7 g/dL — ABNORMAL LOW (ref 13.0–17.0)
Immature Granulocytes: 1 %
Lymphocytes Relative: 19 %
Lymphs Abs: 3.8 10*3/uL (ref 0.7–4.0)
MCH: 24.7 pg — ABNORMAL LOW (ref 26.0–34.0)
MCHC: 30 g/dL (ref 30.0–36.0)
MCV: 82.4 fL (ref 80.0–100.0)
Monocytes Absolute: 1.6 10*3/uL — ABNORMAL HIGH (ref 0.1–1.0)
Monocytes Relative: 8 %
Neutro Abs: 14.7 10*3/uL — ABNORMAL HIGH (ref 1.7–7.7)
Neutrophils Relative %: 71 %
Platelets: 553 10*3/uL — ABNORMAL HIGH (ref 150–400)
RBC: 3.52 MIL/uL — ABNORMAL LOW (ref 4.22–5.81)
RDW: 14.8 % (ref 11.5–15.5)
WBC: 20.4 10*3/uL — ABNORMAL HIGH (ref 4.0–10.5)
nRBC: 0 % (ref 0.0–0.2)

## 2023-07-02 LAB — COMPREHENSIVE METABOLIC PANEL
ALT: 35 U/L (ref 0–44)
AST: 30 U/L (ref 15–41)
Albumin: 3 g/dL — ABNORMAL LOW (ref 3.5–5.0)
Alkaline Phosphatase: 118 U/L (ref 38–126)
Anion gap: 13 (ref 5–15)
BUN: 29 mg/dL — ABNORMAL HIGH (ref 8–23)
CO2: 30 mmol/L (ref 22–32)
Calcium: 9.5 mg/dL (ref 8.9–10.3)
Chloride: 88 mmol/L — ABNORMAL LOW (ref 98–111)
Creatinine, Ser: 1.24 mg/dL (ref 0.61–1.24)
GFR, Estimated: >60 mL/min (ref >60)
Glucose, Bld: 281 mg/dL — ABNORMAL HIGH (ref 70–99)
Potassium: 4.9 mmol/L (ref 3.5–5.1)
Sodium: 131 mmol/L — ABNORMAL LOW (ref 135–145)
Total Bilirubin: 0.7 mg/dL (ref 0.0–1.2)
Total Protein: 8.6 g/dL — ABNORMAL HIGH (ref 6.5–8.1)

## 2023-07-02 LAB — I-STAT VENOUS BLOOD GAS, ED
Acid-Base Excess: 10 mmol/L — ABNORMAL HIGH (ref 0.0–2.0)
Bicarbonate: 37.7 mmol/L — ABNORMAL HIGH (ref 20.0–28.0)
Calcium, Ion: 1.17 mmol/L (ref 1.15–1.40)
HCT: 30 % — ABNORMAL LOW (ref 39.0–52.0)
Hemoglobin: 10.2 g/dL — ABNORMAL LOW (ref 13.0–17.0)
O2 Saturation: 94 %
Potassium: 4.9 mmol/L (ref 3.5–5.1)
Sodium: 130 mmol/L — ABNORMAL LOW (ref 135–145)
TCO2: 40 mmol/L — ABNORMAL HIGH (ref 22–32)
pCO2, Ven: 68.3 mm[Hg] — ABNORMAL HIGH (ref 44–60)
pH, Ven: 7.35 (ref 7.25–7.43)
pO2, Ven: 77 mm[Hg] — ABNORMAL HIGH (ref 32–45)

## 2023-07-02 LAB — RESP PANEL BY RT-PCR (RSV, FLU A&B, COVID)  RVPGX2
Influenza A by PCR: NEGATIVE
Influenza B by PCR: NEGATIVE
Resp Syncytial Virus by PCR: NEGATIVE
SARS Coronavirus 2 by RT PCR: NEGATIVE

## 2023-07-02 LAB — I-STAT CG4 LACTIC ACID, ED
Lactic Acid, Venous: 1.1 mmol/L (ref 0.5–1.9)
Lactic Acid, Venous: 1 mmol/L (ref 0.5–1.9)

## 2023-07-02 LAB — TROPONIN I (HIGH SENSITIVITY)
Troponin I (High Sensitivity): 20 ng/L — ABNORMAL HIGH (ref <18)
Troponin I (High Sensitivity): 205 ng/L (ref <18)

## 2023-07-02 MED ORDER — LEVALBUTEROL HCL 1.25 MG/0.5ML IN NEBU
1.2500 mg | INHALATION_SOLUTION | Freq: Four times a day (QID) | RESPIRATORY_TRACT | Status: DC
  Administered 2023-07-03 (×2): 1.25 mg via RESPIRATORY_TRACT
  Filled 2023-07-02 (×3): qty 0.5

## 2023-07-02 MED ORDER — ACETAMINOPHEN 650 MG RE SUPP: 650.0000 mg | Freq: Four times a day (QID) | RECTAL | Status: DC | PRN

## 2023-07-02 MED ORDER — PIPERACILLIN-TAZOBACTAM 3.375 G IVPB
3.3750 g | Freq: Three times a day (TID) | INTRAVENOUS | Status: DC
  Administered 2023-07-03 – 2023-07-04 (×5): 3.375 g via INTRAVENOUS
  Filled 2023-07-02 (×5): qty 50

## 2023-07-02 MED ORDER — LIDOCAINE HCL URETHRAL/MUCOSAL 2 % EX GEL
1.0000 | Freq: Once | CUTANEOUS | Status: AC
  Administered 2023-07-02: 1 via TOPICAL
  Filled 2023-07-02: qty 11

## 2023-07-02 MED ORDER — IOHEXOL 350 MG/ML SOLN
75.0000 mL | Freq: Once | INTRAVENOUS | Status: AC | PRN
  Administered 2023-07-02: 75 mL via INTRAVENOUS

## 2023-07-02 MED ORDER — HEPARIN (PORCINE) 25000 UT/250ML-% IV SOLN: 1300.0000 [IU]/h | INTRAVENOUS | Status: DC

## 2023-07-02 MED ORDER — SODIUM CHLORIDE 0.9 % IV SOLN
2.0000 g | Freq: Once | INTRAVENOUS | Status: AC
  Administered 2023-07-02: 2 g via INTRAVENOUS
  Filled 2023-07-02: qty 20

## 2023-07-02 MED ORDER — LACTATED RINGERS IV SOLN: INTRAVENOUS | Status: AC

## 2023-07-02 MED ORDER — LACTATED RINGERS IV BOLUS (SEPSIS)
1000.0000 mL | Freq: Once | INTRAVENOUS | Status: AC
Start: 2023-07-02 — End: 2023-07-03
  Administered 2023-07-02: 1000 mL via INTRAVENOUS

## 2023-07-02 MED ORDER — SODIUM CHLORIDE 0.9 % IV SOLN
500.0000 mg | Freq: Once | INTRAVENOUS | Status: AC
  Administered 2023-07-02: 500 mg via INTRAVENOUS
  Filled 2023-07-02: qty 5

## 2023-07-02 MED ORDER — PIPERACILLIN-TAZOBACTAM 3.375 G IVPB 30 MIN
3.3750 g | Freq: Once | INTRAVENOUS | Status: AC
  Administered 2023-07-03: 3.375 g via INTRAVENOUS
  Filled 2023-07-02: qty 50

## 2023-07-02 MED ORDER — IPRATROPIUM BROMIDE 0.02 % IN SOLN
0.5000 mg | Freq: Four times a day (QID) | RESPIRATORY_TRACT | Status: DC
  Administered 2023-07-03 – 2023-07-04 (×4): 0.5 mg via RESPIRATORY_TRACT
  Filled 2023-07-02 (×4): qty 2.5

## 2023-07-02 MED ORDER — ACETAMINOPHEN 325 MG PO TABS: 650.0000 mg | ORAL_TABLET | Freq: Four times a day (QID) | ORAL | Status: DC | PRN

## 2023-07-02 MED ORDER — ONDANSETRON HCL 4 MG/2ML IJ SOLN: 4.0000 mg | Freq: Four times a day (QID) | INTRAMUSCULAR | Status: DC | PRN

## 2023-07-02 MED ORDER — HEPARIN BOLUS VIA INFUSION
4000.0000 [IU] | Freq: Once | INTRAVENOUS | Status: DC
  Filled 2023-07-02: qty 4000

## 2023-07-02 MED ORDER — MELATONIN 3 MG PO TABS: 3.0000 mg | ORAL_TABLET | Freq: Every evening | ORAL | Status: DC | PRN

## 2023-07-02 MED ORDER — LEVALBUTEROL HCL 1.25 MG/0.5ML IN NEBU: 1.2500 mg | INHALATION_SOLUTION | RESPIRATORY_TRACT | Status: DC | PRN

## 2023-07-02 MED ORDER — ASPIRIN 81 MG PO CHEW
324.0000 mg | CHEWABLE_TABLET | Freq: Once | ORAL | Status: AC
  Administered 2023-07-02: 324 mg via ORAL
  Filled 2023-07-02: qty 4

## 2023-07-02 NOTE — ED Triage Notes (Signed)
 Pt BIB [facility] from home d/t sob. Hx throat cancer. Started feeling SOB today. Pt was on NRB w fire before ems arrival. Pt was on cipap w EMS w EMS en route. A&O X4.

## 2023-07-02 NOTE — Progress Notes (Addendum)
 PHARMACY - ANTICOAGULATION CONSULT NOTE  Pharmacy Consult for IV heparin Indication: chest pain/ACS  Allergies  Allergen Reactions   Bee Venom Anaphylaxis   Lisinopril Other (See Comments)    Swelling in face   Antifungal [Miconazole Nitrate] Other (See Comments)     made his head hurt   Zolpidem Tartrate Er Other (See Comments)    Leg cramps    Patient Measurements: Height: 6' (182.9 cm) Weight: 110 kg (242 lb 8.1 oz) IBW/kg (Calculated) : 77.6 Heparin Dosing Weight: 100.9 kg  Vital Signs: Temp: 97.9 F (36.6 C) (02/23 1849) Temp Source: Axillary (02/23 1849) BP: 121/67 (02/23 2115) Pulse Rate: 116 (02/23 2115)  Labs: Recent Labs    07/02/23 1847 07/02/23 1901 07/02/23 2126  HGB 8.7* 10.2*  --   HCT 29.0* 30.0*  --   PLT 553*  --   --   CREATININE 1.24  --   --   TROPONINIHS 20*  --  205*    Estimated Creatinine Clearance: 74.1 mL/min (by C-G formula based on SCr of 1.24 mg/dL).   Medical History: 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- last radiation 06-2018   Cataract    removed both eyes with l;ens implants    COPD (chronic obstructive pulmonary disease) (HCC)    Dyspnea    Family history of colon cancer    in his brother- dx'd age 45    History of chemotherapy    last 06-01-2018   History of kidney stones    History of radiation therapy    last radiation 06-2018   Hyperlipidemia    currently under control   Hypertension    Irregular heart beats    Lymphadenopathy    Pain, lower leg    Bilateral   Peripheral arterial disease (HCC)    Pre-diabetes    Red-green color blindness    RLS (restless legs syndrome)    Snores    Wears glasses     Assessment: Jesus [name] is a 68 y.o. year old male admitted on 07/02/2023 with concern for ACS. No  anticoagulation prior to admission. Pharmacy consulted to dose heparin.  Goal of Therapy:  Heparin level 0.3-0.7 units/ml Monitor platelets by anticoagulation protocol: Yes   Plan:  Heparin 4000 units x 1 as bolus followed by heparin infusion at 1300 units/hr 6 heparin level  Daily heparin level, CBC, and monitoring for bleeding F/u plans for anticoagulation and cards recs  Thank you for allowing pharmacy to participate in this patient's care.  Marja Kays, PharmD Emergency Medicine Clinical Pharmacist 07/02/2023,10:39 PM    -------------------- Addendum:  Heparin discontinued prior to starting drip.  Thank you for allowing pharmacy to participate in this patient's care.  Marja Kays, PharmD Emergency Medicine Clinical Pharmacist 07/02/2023,11:17 PM

## 2023-07-02 NOTE — ED Provider Notes (Signed)
 [place] EMERGENCY DEPARTMENT AT [facility] Provider Note   CSN: [id number] Arrival date & time: 07/02/23  1839     History  Chief Complaint  Patient presents with  . Shortness of Breath    Jesus [name] is a 68 y.o. male.  Patient is a 68 year old male with a past medical history of laryngeal cancer and B-cell lymphoma on immunotherapy with PEG tube in place and hypertension presenting to the emergency department with shortness of breath.  Patient reports he has had increasing shortness of breath over the last 2 to 3 days.,  His breathing got significantly worse today.  He has had an associated cough and today started to cough up large blood clots.  EMS reports on their arrival he was sitting on the front step in respiratory distress and was hypoxic to the 70s on room air.  They did place him in CPAP on route.  Patient denies any associated chest pain or fever.  He denies any prior history of blood clots or any blood thinner use.  The history is provided by the patient.  Shortness of Breath      Home Medications Prior to Admission medications   Medication Sig Start Date End Date Taking? Authorizing Provider  ACCU-CHEK GUIDE test strip 1 each by Other route as needed for other. 01/05/23   [provider]  Accu-Chek Softclix Lancets lancets  01/05/23   [provider]  acetaminophen (TYLENOL) 160 MG/5ML solution Place 20.3 mLs (650 mg total) into feeding tube every 6 (six) hours as needed for mild pain (pain score 1-3). 06/20/23   Maczis, Elmer Sow, PA-C  glycopyrrolate (ROBINUL) 1 MG tablet Take 1 tablet (1 mg total) by mouth every 4 (four) hours as needed (excessive secretions). 06/24/23   [place], Alvira Philips, DO  lidocaine-prilocaine (EMLA) cream Apply to affected area once 08/04/22   Johney Maine, MD  Nutritional Supplements (NUTREN 1.5) LIQD Give 2 cartons 4 times a day.  Flush with 30ml of water before and after each feeding.  Give additional  of water TID for additional hydration. 06/26/23   Johney Maine, MD  oxyCODONE (ROXICODONE) 5 MG/5ML solution Place 5 mLs (5 mg total) into feeding tube every 6 (six) hours as needed for breakthrough pain. 06/20/23   Maczis, Elmer Sow, PA-C  Water For Irrigation, Sterile (FREE WATER) SOLN Place 125 mLs into feeding tube every 4 (four) hours. 06/24/23 09/22/23  [place], Eric J, DO      Allergies    Bee venom, Lisinopril, Antifungal [miconazole nitrate], and Zolpidem tartrate er    Review of Systems   Review of Systems  Respiratory:  Positive for shortness of breath.     Physical Exam Updated Vital Signs BP 121/67   Pulse (!) 116   Temp 97.9 F (36.6 C) (Axillary)   Resp 19   SpO2 100%  Physical Exam Vitals and nursing note reviewed.  Constitutional:      General: He is in acute distress.     Appearance: He is well-developed. He is obese. He is ill-appearing.  HENT:     Head: Normocephalic and atraumatic.     Mouth/Throat:     Mouth: Mucous membranes are moist.     Comments: Spitting up blood tinged sputum Eyes:     Extraocular Movements: Extraocular movements intact.  Cardiovascular:     Rate and Rhythm: Regular rhythm. Tachycardia present.  Pulmonary:     Effort: Tachypnea and accessory muscle usage present.  Breath sounds: Examination of the right-lower field reveals rales. Examination of the left-lower field reveals rales. Rales present.  Abdominal:     Palpations: Abdomen is soft.     Tenderness: There is no abdominal tenderness.  Musculoskeletal:        General: Normal range of motion.     Cervical back: Normal range of motion and neck supple.     Right lower leg: No edema.     Left lower leg: No edema.  Skin:    General: Skin is warm and dry.  Neurological:     General: No focal deficit present.     Mental Status: He is alert and oriented to person, place, and time.  Psychiatric:        Mood and Affect: Mood normal.        Behavior: Behavior normal.      ED Results / Procedures / Treatments   Labs (all labs ordered are listed, but only abnormal results are displayed) Labs Reviewed  COMPREHENSIVE METABOLIC PANEL - Abnormal; Notable for the following components:      Result Value   Sodium 131 (*)    Chloride 88 (*)    Glucose, Bld 281 (*)    BUN 29 (*)    Total Protein 8.6 (*)    Albumin 3.0 (*)    All other components within normal limits  CBC WITH DIFFERENTIAL/PLATELET - Abnormal; Notable for the following components:   WBC 20.4 (*)    RBC 3.52 (*)    Hemoglobin 8.7 (*)    HCT 29.0 (*)    MCH 24.7 (*)    Platelets 553 (*)    Neutro Abs 14.7 (*)    Monocytes Absolute 1.6 (*)    Abs Immature Granulocytes 0.11 (*)    All other components within normal limits  I-STAT VENOUS BLOOD GAS, ED - Abnormal; Notable for the following components:   pCO2, Ven 68.3 (*)    pO2, Ven 77 (*)    Bicarbonate 37.7 (*)    TCO2 40 (*)    Acid-Base Excess 10.0 (*)    Sodium 130 (*)    HCT 30.0 (*)    Hemoglobin 10.2 (*)    All other components within normal limits  TROPONIN I (HIGH SENSITIVITY) - Abnormal; Notable for the following components:   Troponin I (High Sensitivity) 20 (*)    All other components within normal limits  TROPONIN I (HIGH SENSITIVITY) - Abnormal; Notable for the following components:   Troponin I (High Sensitivity) 205 (*)    All other components within normal limits  RESP PANEL BY RT-PCR (RSV, FLU A&B, COVID)  RVPGX2  CULTURE, BLOOD (ROUTINE X 2)  CULTURE, BLOOD (ROUTINE X 2)  BRAIN NATRIURETIC PEPTIDE  I-STAT CG4 LACTIC ACID, ED  I-STAT CG4 LACTIC ACID, ED    EKG EKG Interpretation Date/Time:  Sunday July 02 2023 18:48:40 EST Ventricular Rate:  146 PR Interval:  272 QRS Duration:  147 QT Interval:  373 QTC Calculation: 582 R Axis:   -80  Text Interpretation: Sinus tachycardia Prolonged PR interval Prominent P waves, nondiagnostic RBBB and LAFB Heart rate increased otherwise no significant change  from prior EKG Confirmed by Elayne Snare ([id number]) on 07/02/2023 6:51:43 PM  Radiology CT Angio Chest PE W/Cm &/Or Wo Cm Result Date: 07/02/2023 CLINICAL DATA:  Pulmonary embolism (PE) suspected, high prob. Shortness of breath EXAM: CT ANGIOGRAPHY CHEST WITH CONTRAST TECHNIQUE: Multidetector CT imaging of the chest was performed using the standard protocol during  bolus administration of intravenous contrast. Multiplanar CT image reconstructions and MIPs were obtained to evaluate the vascular anatomy. RADIATION DOSE REDUCTION: This exam was performed according to the departmental dose-optimization program which includes automated exposure control, adjustment of the mA and/or kV according to patient size and/or use of iterative reconstruction technique. CONTRAST:  75mL OMNIPAQUE IOHEXOL 350 MG/ML SOLN COMPARISON:  06/15/2023 FINDINGS: Cardiovascular: No filling defects in the pulmonary arteries to suggest pulmonary emboli. Heart is normal size. Aorta is normal caliber. Mediastinum/Nodes: Abnormal soft tissue at the thoracic inlet and extending into the upper mediastinum surrounding the trachea and esophagus corresponding to known laryngeal cancer. This narrows the trachea at the level of the thoracic inlet. Findings are similar to prior study. No adenopathy. Lungs/Pleura: Lungs are clear. No focal airspace opacities or suspicious nodules. No effusions. Upper Abdomen: No acute findings Musculoskeletal: Right upper chest wall Port-A-Cath remains in place. No acute bony abnormality. Review of the MIP images confirms the above findings. IMPRESSION: No evidence of pulmonary embolus. Abnormal soft tissue at the thoracic inlet and upper mediastinum surrounding the trachea and esophagus compatible with patient's known cancer. This narrows the tracheal lumen significantly at the level of the thoracic inlet. Electronically Signed   By: Charlett Nose M.D.   On: 07/02/2023 21:06   DG Chest Port 1 View Result Date:  07/02/2023 CLINICAL DATA:  Shortness of breath EXAM: PORTABLE CHEST 1 VIEW COMPARISON:  06/15/2023 FINDINGS: Lungs are clear.  No pleural effusion or pneumothorax. The heart is normal in size. Right chest power port terminates in the lower SVC. IMPRESSION: No acute cardiopulmonary disease. Electronically Signed   By: Charline Bills M.D.   On: 07/02/2023 20:10    Procedures .Critical Care  Performed by: Rexford Maus, DO Authorized by: Rexford Maus, DO   Critical care provider statement:    Critical care time (minutes):  30   Critical care was necessary to treat or prevent imminent or life-threatening deterioration of the following conditions:  Respiratory failure   Critical care was time spent personally by me on the following activities:  Development of treatment plan with patient or surrogate, discussions with consultants, evaluation of patient's response to treatment, examination of patient, ordering and review of laboratory studies, ordering and review of radiographic studies, ordering and performing treatments and interventions, pulse oximetry, re-evaluation of patient's condition and review of old charts     Medications Ordered in ED Medications  lactated ringers infusion (has no administration in time range)  lactated ringers bolus 1,000 mL (has no administration in time range)  cefTRIAXone (ROCEPHIN) 2 g in sodium chloride 0.9 % 100 mL IVPB (has no administration in time range)  azithromycin (ZITHROMAX) 500 mg in sodium chloride 0.9 % 250 mL IVPB (has no administration in time range)  iohexol (OMNIPAQUE) 350 MG/ML injection 75 mL (75 mLs Intravenous Contrast Given 07/02/23 2059)    ED Course/ Medical Decision Making/ A&P Clinical Course as of 07/02/23 2258  Sun Jul 02, 2023  2037 Mild hypercapnia and mildly elevated troponin. CXR without acute disease. CTPE study pending. [VK]  2151 No acute PE on CT imaging. No obvious pneumonia but will be started on antibiotics  with elevated WBC and recent increased cough. Will be admitted for respiratory distress hypoxic respiratory failure. [VK]  2229 Patient satting well now on 3L [place] with improved respiratory symptoms.  [VK]  2231 Repeat troponin 205. Will be started on aspirin and heparin and cardiology consulted. [VK]  2256 I spoke with Dr.  Mehl with cardiology who recommended trending troponin and would hold off on heparin at this time with recent hemoptysis.  [VK]    Clinical Course User Index [VK] Rexford Maus, DO                                 Medical Decision Making This patient presents to the ED with chief complaint(s) of respiratory distress with pertinent past medical history of laryngeal cancer and B-cell lymphoma on immunotherapy, hypertension which further complicates the presenting complaint. The complaint involves an extensive differential diagnosis and also carries with it a high risk of complications and morbidity.    The differential diagnosis includes ACS, arrhythmia, anemia, pneumonia, pneumothorax, pulmonary edema, pleural effusion, considering PE with his cancer history, tachycardia and recent hospitalization, bronchitis  Additional history obtained: Additional history obtained from EMS  Records reviewed previous admission documents  ED Course and Reassessment: On patient's arrival he was tachypneic and tachycardic, satting well on CPAP.  I immediately evaluated the patient at bedside on his arrival.  Respiratory is at bedside and transition the patient to BiPAP.  EKG on arrival showed sinus tachycardia without acute ischemic changes.  Patient will have labs, chest x-ray and CT PE study to further evaluate for cause of his respiratory distress continue to be closely reassessed.  Independent labs interpretation:  The following labs were independently interpreted: mildly elevated troponin, leukocytosis  Independent visualization of imaging: - I independently visualized the  following imaging with scope of interpretation limited to determining acute life threatening conditions related to emergency care: CXR, CTPE, which revealed findings consistent with known cancer, no PE  Consultation: - Consulted or discussed management/test interpretation w/ external professional: hospitalist  Consideration for admission or further workup: patient requires admission for hypoxic respiratory failures Social Determinants of health: N/A    Amount and/or Complexity of Data Reviewed Labs: ordered. Radiology: ordered.  Risk Prescription drug management. Decision regarding hospitalization.          Final Clinical Impression(s) / ED Diagnoses Final diagnoses:  Acute respiratory failure with hypoxia (HCC)  Respiratory distress  Sepsis with acute hypoxic respiratory failure without septic shock, due to unspecified organism [facility])    Rx / DC Orders ED Discharge Orders     None         Rexford Maus, DO 07/02/23 2231

## 2023-07-02 NOTE — Sepsis Progress Note (Signed)
 Elink following code sepsis

## 2023-07-02 NOTE — Progress Notes (Signed)
   07/02/23 1917  Vent Select  Invasive or Noninvasive Noninvasive  Adult Vent Y  Adult Ventilator Settings  Vent Type Servo i  Humidity HME  Vent Mode BIPAP  Set Rate 15 bmp  FiO2 (%) 40 %  IPAP 14 cmH20  EPAP 6 cmH20  Pressure Control 8 cmH20  PEEP 6 cmH20  Adult Ventilator Measurements  Peak Airway Pressure 13 L/min  Mean Airway Pressure 9 cmH20  Resp Rate Spontaneous 13 br/min  Resp Rate Total 28 br/min  Exhaled Vt 546 mL  Measured Ve 16 L  I:E Ratio Measured 1:1.6  Auto PEEP 0 cmH20  Total PEEP 6 cmH20  Adult Ventilator Alarms  Alarms On Y  Ve High Alarm 25 L/min  Ve Low Alarm 5 L/min  Resp Rate High Alarm 40 br/min  Resp Rate Low Alarm 10  PEEP Low Alarm 2 cmH2O  Press High Alarm 28 cmH2O  Breath Sounds  Bilateral Breath Sounds Clear;Diminished  R Upper  Breath Sounds Clear;Diminished  L Upper Breath Sounds Clear;Diminished  R Lower Breath Sounds Diminished  L Lower Breath Sounds Diminished  Vent Respiratory Assessment  Level of Consciousness Alert  Respiratory Pattern Regular

## 2023-07-02 NOTE — Consult Note (Signed)
 Cardiology Consultation   Patient ID: Jesus [name] MRN: [id number]; DOB: May 23, 1955  Admit date: 07/02/2023 Date of Consult: 07/02/2023  PCP:  Jesus Retort, MD   [facility] Providers Cardiologist:  Jesus Haws, MD        Patient Profile:   Jesus [name] is a 68 y.o. 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 Theresia [name].  History of Present Illness:   Jesus [name] Patient reports he has had increasing shortness of breath over the last 2 to 3 days. His breathing got significantly worse today. He has had an associated cough and today started to cough up large blood clots. He felt there was something in his throat that made it feel like he couldn't breath. EMS reports on their arrival he was sitting on the front step in respiratory distress and was hypoxic to the 70s on room air. They did place him in CPAP on route. Patient denies any associated chest pain or fever. He denies any prior history of blood clots or any blood thinner use. ECG shows WCT RBBB LAFB probably ST. CXR without acute abnormality. CTA with no PE. There is abnormal soft tissue at the thoracic inlet and upper mediastinum surrounding the trachea and esophagus compatible with patient's known cancer. This narrows the tracheal lumen significantly at the level of the thoracic inlet. Trop 20->205. Hgb 8.7. BNP normal. His daughter was at bedside.    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- last radiation 06-2018   Cataract    removed both eyes with l;ens implants    COPD (chronic obstructive pulmonary disease) (HCC)    Dyspnea    Family history of  colon cancer    in his brother- dx'd age 38    History of chemotherapy    last 06-01-2018   History of kidney stones    History of radiation therapy    last radiation 06-2018   Hyperlipidemia    currently under control   Hypertension    Irregular heart beats    Lymphadenopathy    Pain, lower leg    Bilateral   Peripheral arterial disease (HCC)    Pre-diabetes    Red-green color blindness    RLS (restless legs syndrome)    Snores    Wears glasses     Past Surgical History:  Procedure Laterality Date   CATARACT EXTRACTION W/ INTRAOCULAR LENS  IMPLANT, BILATERAL     COLONOSCOPY     DIRECT LARYNGOSCOPY Right 02/03/2022   Procedure: DIRECT LARYNGOSCOPY WITH BIOPSY OF RIGHT FALSE VOCAL CORD;  Surgeon: Osborn Coho, MD;  Location: [facility] OR;  Service: ENT;  Laterality: Right;   dislodged salava stone     FRACTURE SURGERY     HAND ARTHROPLASTY  1995   crushed left hand   INGUINAL LYMPH NODE BIOPSY Left 01/02/2018   Procedure: LEFT INGUINAL LYMPH NODE BIOPSY;  Surgeon: Emelia Loron, MD;  Location: [facility] OR;  Service: General;  Laterality: Left;   IR IMAGING GUIDED PORT INSERTION  01/15/2018   IR IMAGING GUIDED  PORT INSERTION  08/10/2022   IR REMOVAL TUN ACCESS W/ PORT W/O FL MOD SED  03/11/2019   IR REPLACE G-TUBE SIMPLE WO FLUORO  06/21/2023   LAPAROSCOPIC INSERTION GASTROSTOMY TUBE N/A 06/19/2023   Procedure: LAPAROSCOPIC INSERTION GASTROSTOMY TUBE;  Surgeon: Quentin Ore, MD;  Location: [facility] ORS;  Service: General;  Laterality: N/A;   LAPAROSCOPIC INSERTION GASTROSTOMY TUBE N/A 06/21/2023   Procedure: LAPAROSCOPIC INSERTION GASTROSTOMY TUBE;  Surgeon: Quentin Ore, MD;  Location: [facility] ORS;  Service: General;  Laterality: N/A;   MEDIASTINOSCOPY N/A 07/22/2022   Procedure: MEDIASTINOSCOPY;  Surgeon: Loreli Slot, MD;  Location: [facility] OR;  Service: Thoracic;  Laterality: N/A;   MICROLARYNGOSCOPY Left 01/17/2014   Procedure: MICROLARYNGOSCOPY WITH EXCISION OF THE BIOPSY OF  LEFT VOCAL CORD LESION;  Surgeon: Serena Colonel, MD;  Location: [facility];  Service: ENT;  Laterality: Left;   MICROLARYNGOSCOPY N/A 09/16/2020   Procedure: MICROLARYNGOSCOPY with Biopsy of vocal cord lesion;  Surgeon: Osborn Coho, MD;  Location: [facility] OR;  Service: ENT;  Laterality: N/A;   ORIF FOOT FRACTURE  2005   left   REFRACTIVE SURGERY Right    removed cloudiness in right eye after cataract removal      Home Medications:  Prior to Admission medications   Medication Sig Start Date End Date Taking? Authorizing Provider  ACCU-CHEK GUIDE test strip 1 each by Other route as needed for other. 01/05/23   [provider]  Accu-Chek Softclix Lancets lancets  01/05/23   [provider]  acetaminophen (TYLENOL) 160 MG/5ML solution Place 20.3 mLs (650 mg total) into feeding tube every 6 (six) hours as needed for mild pain (pain score 1-3). 06/20/23   Maczis, Elmer Sow, PA-C  glycopyrrolate (ROBINUL) 1 MG tablet Take 1 tablet (1 mg total) by mouth every 4 (four) hours as needed (excessive secretions). 06/24/23   [place], Alvira Philips, DO  lidocaine-prilocaine (EMLA) cream Apply to affected area once 08/04/22   Johney Maine, MD  Nutritional Supplements (NUTREN 1.5) LIQD Give 2 cartons 4 times a day.  Flush with 30ml of water before and after each feeding.  Give additional of water TID for additional hydration. 06/26/23   Johney Maine, MD  oxyCODONE (ROXICODONE) 5 MG/5ML solution Place 5 mLs (5 mg total) into feeding tube every 6 (six) hours as needed for breakthrough pain. 06/20/23   Maczis, Elmer Sow, PA-C  Water For Irrigation, Sterile (FREE WATER) SOLN Place 125 mLs into feeding tube every 4 (four) hours. 06/24/23 09/22/23  [place], Eric J, DO    Inpatient Medications: Scheduled Meds:  aspirin  324 mg Oral Once   heparin  4,000 Units Intravenous Once   Continuous Infusions:  azithromycin     cefTRIAXone (ROCEPHIN)  IV 2 g (07/02/23 2246)   heparin      lactated ringers 1,000 mL (07/02/23 2249)   lactated ringers     PRN Meds:   Allergies:    Allergies  Allergen Reactions   Bee Venom Anaphylaxis   Lisinopril Other (See Comments)    Swelling in face   Antifungal [Miconazole Nitrate] Other (See Comments)     made his head hurt   Zolpidem Tartrate Er Other (See Comments)    Leg cramps    Social History:   Social History   Socioeconomic History   Marital status: Divorced    Spouse name: Not on file   Number of children: 3   Years of education: Not on file  Highest education level: Not on file  Occupational History   Not on file  Tobacco Use   Smoking status: Former    Current packs/day: 0.00    Average packs/day: 0.5 packs/day for 36.0 years (18.0 ttl pk-yrs)    Types: Cigarettes    Start date: 09/28/1984    Quit date: 09/28/2020    Years since quitting: 2.7   Smokeless tobacco: Never   Tobacco comments:    he denies smoking in about 2 weeks 07/13/18  Vaping Use   Vaping status: Never Used  Substance and Sexual Activity   Alcohol use: Not Currently    Alcohol/week: 6.0 standard drinks of alcohol    Types: 6 Cans of beer per week    Comment: Last use: Friday 30th   Drug use: Yes    Types: Marijuana, Cocaine    Comment: "Edibles"   Sexual activity: Not Currently  Other Topics Concern   Not on file  Social History Narrative   Not on file   Social Drivers of Health   Financial Resource Strain: Low Risk  (10/06/2020)   Overall Financial Resource Strain (CARDIA)    Difficulty of Paying Living Expenses: Not very hard  Food Insecurity: No Food Insecurity (06/16/2023)   Hunger Vital Sign    Worried About Running Out of Food in the Last Year: Never true    Ran Out of Food in the Last Year: Never true  Transportation Needs: No Transportation Needs (06/16/2023)   PRAPARE - Administrator, Civil Service (Medical): No    Lack of Transportation (Non-Medical): No  Physical Activity: Not on file  Stress: Not on  file  Social Connections: Moderately Isolated (06/16/2023)   Social Connection and Isolation Panel [NHANES]    Frequency of Communication with Friends and Family: More than three times a week    Frequency of Social Gatherings with Friends and Family: Once a week    Attends Religious Services: Never    Database administrator or Organizations: Yes    Attends Engineer, structural: More than 4 times per year    Marital Status: Divorced  Intimate Partner Violence: Not At Risk (06/16/2023)   Humiliation, Afraid, Rape, and Kick questionnaire    Fear of Current or Ex-Partner: No    Emotionally Abused: No    Physically Abused: No    Sexually Abused: No    Family History:    Family History  Problem Relation Age of Onset   Breast cancer Mother    Diabetes Father    Hypertension Father    Stroke Father    Mental illness Sister    Hypertension Daughter    Mental illness Daughter    Hypertension Brother    Colon cancer Brother 20       passed away 2018-11-23   Breast cancer Sister    Esophageal cancer Neg Hx    Colon polyps Neg Hx    Rectal cancer Neg Hx    Stomach cancer Neg Hx      ROS:  Please see the history of present illness.   All other ROS reviewed and negative.     Physical Exam/Data:   Vitals:   07/02/23 2200 07/02/23 2215 07/02/23 2230 07/02/23 2238  BP: 97/65 99/63 90/64  90/64  Pulse: (!) 112 (!) 108 (!) 116   Resp: (!) 21 15 (!) 25   Temp:      TempSrc:      SpO2: 99% 99% 98% 100%  Weight:  110 kg     Height: 6' (1.829 m)      No intake or output data in the 24 hours ending 07/02/23 2307    07/02/2023   10:00 PM 06/23/2023    7:02 AM 06/22/2023    7:17 AM  Last 3 Weights  Weight (lbs) 242 lb 8.1 oz 242 lb 8.1 oz 236 lb 12.4 oz  Weight (kg) 110 kg 110 kg 107.4 kg     Body mass index is 32.89 kg/m.  General:  chronically ill appearing HEENT: normal Neck: no JVD, stridor Vascular: No carotid bruits; Distal pulses 2+ bilaterally, Cardiac:  normal S1, S2;  RRR; no murmur  Lungs:  clear to auscultation bilaterally, no wheezing, rhonchi or rales  Abd: soft, nontender, no hepatomegaly  Ext: no edema Musculoskeletal:  No deformities, BUE and BLE strength normal and equal Skin: warm and dry  Neuro:  CNs 2-12 intact, no focal abnormalities noted Psych:  Normal affect   EKG:  The EKG was personally reviewed and demonstrates:   Telemetry:  Telemetry was personally reviewed and demonstrates:    Relevant CV Studies: ECHO 09/2021 IMPRESSIONS  1. Left ventricular ejection fraction, by estimation, is 55 to 60%. The  left ventricle has normal function. The left ventricle has no regional  wall motion abnormalities. There is moderate concentric left ventricular  hypertrophy. Left ventricular  diastolic parameters were normal.   2. Right ventricular systolic function is normal. The right ventricular  size is normal. There is normal pulmonary artery systolic pressure. The  estimated right ventricular systolic pressure is 21.0 mmHg.   3. The mitral valve is grossly normal. No evidence of mitral valve  regurgitation. No evidence of mitral stenosis.   4. The aortic valve is tricuspid. Aortic valve regurgitation is not  visualized. No aortic stenosis is present.   5. The inferior vena cava is dilated in size with >50% respiratory  variability, suggesting right atrial pressure of 8 mmHg.   Laboratory Data:  High Sensitivity Troponin:   Recent Labs  Lab 06/15/23 1916 07/02/23 1847 07/02/23 2126  TROPONINIHS 9 20* 205*     Chemistry Recent Labs  Lab 07/02/23 1847 07/02/23 1901  NA 131* 130*  K 4.9 4.9  CL 88*  --   CO2 30  --   GLUCOSE 281*  --   BUN 29*  --   CREATININE 1.24  --   CALCIUM 9.5  --   GFRNONAA >60  --   ANIONGAP 13  --     Recent Labs  Lab 07/02/23 1847  PROT 8.6*  ALBUMIN 3.0*  AST 30  ALT 35  ALKPHOS 118  BILITOT 0.7   Lipids No results for input(s): "CHOL", "TRIG", "HDL", "LABVLDL", "LDLCALC", "CHOLHDL" in the  last 168 hours.  Hematology Recent Labs  Lab 07/02/23 1847 07/02/23 1901  WBC 20.4*  --   RBC 3.52*  --   HGB 8.7* 10.2*  HCT 29.0* 30.0*  MCV 82.4  --   MCH 24.7*  --   MCHC 30.0  --   RDW 14.8  --   PLT 553*  --    Thyroid No results for input(s): "TSH", "FREET4" in the last 168 hours.  BNP Recent Labs  Lab 07/02/23 1847  BNP 27.6    DDimer No results for input(s): "DDIMER" in the last 168 hours.   Radiology/Studies:  CT Angio Chest PE W/Cm &/Or Wo Cm Result Date: 07/02/2023 CLINICAL DATA:  Pulmonary embolism (PE) suspected, high prob. Shortness  of breath EXAM: CT ANGIOGRAPHY CHEST WITH CONTRAST TECHNIQUE: Multidetector CT imaging of the chest was performed using the standard protocol during bolus administration of intravenous contrast. Multiplanar CT image reconstructions and MIPs were obtained to evaluate the vascular anatomy. RADIATION DOSE REDUCTION: This exam was performed according to the departmental dose-optimization program which includes automated exposure control, adjustment of the mA and/or kV according to patient size and/or use of iterative reconstruction technique. CONTRAST:  75mL OMNIPAQUE IOHEXOL 350 MG/ML SOLN COMPARISON:  06/15/2023 FINDINGS: Cardiovascular: No filling defects in the pulmonary arteries to suggest pulmonary emboli. Heart is normal size. Aorta is normal caliber. Mediastinum/Nodes: Abnormal soft tissue at the thoracic inlet and extending into the upper mediastinum surrounding the trachea and esophagus corresponding to known laryngeal cancer. This narrows the trachea at the level of the thoracic inlet. Findings are similar to prior study. No adenopathy. Lungs/Pleura: Lungs are clear. No focal airspace opacities or suspicious nodules. No effusions. Upper Abdomen: No acute findings Musculoskeletal: Right upper chest wall Port-A-Cath remains in place. No acute bony abnormality. Review of the MIP images confirms the above findings. IMPRESSION: No evidence of  pulmonary embolus. Abnormal soft tissue at the thoracic inlet and upper mediastinum surrounding the trachea and esophagus compatible with patient's known cancer. This narrows the tracheal lumen significantly at the level of the thoracic inlet. Electronically Signed   By: Charlett Nose M.D.   On: 07/02/2023 21:06   DG Chest Port 1 View Result Date: 07/02/2023 CLINICAL DATA:  Shortness of breath EXAM: PORTABLE CHEST 1 VIEW COMPARISON:  06/15/2023 FINDINGS: Lungs are clear.  No pleural effusion or pneumothorax. The heart is normal in size. Right chest power port terminates in the lower SVC. IMPRESSION: No acute cardiopulmonary disease. Electronically Signed   By: Charline Bills M.D.   On: 07/02/2023 20:10     Assessment and Plan:   Demand ischemia  Hypoxia suspected 2/2 to aspiration related to tumor/bleeding/hemoptysis Sinus tachycardia 2/2 to hypoxia Laryngeal cancer  Trend trops. Would hold off on heparin in setting of hemoptysis. ECHO in morning. Please let us know if trop trend exponential.    Risk Assessment/Risk Scores:       For questions or updates, please contact [facility] Please consult [email or web address] for contact info under    Signed, Adline Mango, MD  07/02/2023 11:07 PM

## 2023-07-02 NOTE — Progress Notes (Signed)
 Pharmacy Antibiotic Note  Jesus [name] is a 68 y.o. male admitted on 07/02/2023 with acute respiratory failure and concern for pneumonia. Given history if cancer on immunotherapy, antibiotics broadened to include pseudomonas coverage. Pharmacy has been consulted for zosyn dosing.  Plan: Zosyn 3.375g q8h  F/u renal function, infectious work up and length of therapy  Height: 6' (182.9 cm) Weight: 110 kg (242 lb 8.1 oz) IBW/kg (Calculated) : 77.6  Temp (24hrs), Avg:98.4 F (36.9 C), Min:97.9 F (36.6 C), Max:98.8 F (37.1 C)  Recent Labs  Lab 07/02/23 1847 07/02/23 1907 07/02/23 2141  WBC 20.4*  --   --   CREATININE 1.24  --   --   LATICACIDVEN  --  1.1 1.0    Estimated Creatinine Clearance: 74.1 mL/min (by C-G formula based on SCr of 1.24 mg/dL).    Allergies  Allergen Reactions   Bee Venom Anaphylaxis   Lisinopril Other (See Comments)    Swelling in face   Antifungal [Miconazole Nitrate] Other (See Comments)     made his head hurt   Zolpidem Tartrate Er Other (See Comments)    Leg cramps    Antimicrobials this admission: Ceftriaxone 2/23> Azithromycin 2/23> Zosyn 2/23>  Microbiology results: 2/23 bcx: 2/23 resp pcr: neg  Thank you for allowing pharmacy to be a part of this patient's care.  Marja Kays 07/02/2023 11:31 PM

## 2023-07-02 NOTE — H&P (Signed)
 History and Physical      Jesus [name] ZOX:[id number] DOB: 10/25/1955 DOA: 07/02/2023; DOS: 07/02/2023  PCP: Jesus Congo, MD  Patient coming from: home   I have personally briefly reviewed patient's old medical records in [facility]  Chief Complaint: sob  HPI: Jesus [name] is a 68 y.o. 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    The patient reports 3 days of progressive shortness of breath associated with worsening productive cough.  He denies any associated orthopnea, PND, or worsening of peripheral edema.  No associated any wheezing, chest pain, palpitations, diaphoresis nausea, vomiting.  Denies any associated subjective fever, chills, rigors, or generalized myalgias.  Per chart review, he has a documented history of COPD in the setting of a long prior smoking history.  Patient confirms no known baseline supplemental oxygen requirements.  He also has a history of laryngeal cancer, and was hospitalized within the last month for aspiration pneumonia.  He underwent G-tube placement in February 2025 and has been undergoing tube feeds as an outpatient subsequent to that.  Per chart review, most recent echocardiogram occurred in May 2023.  In the setting of his progressive shortness of breath, patient contacted EMS, noted patient's initial oxygen saturations to be in the 70s, prompting initiation of CPAP before bringing the patient to [facility] emergency department where BiPAP was initiated upon arrival.    ED Course:  Vital signs in the ED were notable for the following: Afebrile; heart rates in the 130s to  140s initially, associated sinus tachycardia, Sosan improving in the 90s to low 100s following interval IV fluids and improvement in oxygen saturation; systolic blood pressures in the 90s to 120s; respiratory rate 15-24; oxygen saturations noted at home to be in the 70s by EMS, prompting CPAP initiation and route to the emergency department where SiPAP was transitioned to BiPAP, upon which the patient was maintaining oxygen saturation 99 to 100%.  Subsequently, the BiPAP was transitioned to salter high flow nasal cannula, and the patient has been maintaining oxygen saturations in the range of 98 to 100% on 3 to 4 L salter high flow nasal cannula  Labs were notable for the following: CMP notable for the following: Sodium 131, which corrects to approximately 134 when adjusted for hyperglycemia, bicarbonate 30, creatinine 1.24 compared to most recent prior value of 0.79 on 06/24/2023, glucose 281, liver enzymes are within normal limits.  BNP 27.  High sensitive troponin I was initially 20, with repeat value trending up to 205, relative demonstration prior high-sensitivity troponin I data point of 912 625.  Initial lactic acid 1.1, 3 value trending down to 1.0.  CBC notable for white blood cell count 20,400, hemoglobin 8.7 associated with normocytic/Norocarp findings and relative to most recent prior hemoglobin data point of 8.1 on 06/22/2023.  Blood cultures x 2 were collected prior to initiation of IV antibiotics, and COVID, influenza, RSV PCR were all negative.  Per my interpretation, EKG in ED demonstrated the following: EKG shows sinus tachycardia with heart rate 146, bifascicular block, no evidence of new T wave or ST changes.  Imaging in the ED, per corresponding formal radiology read, was notable for the  following: CTA chest with PE protocol showed no evidence of acute cardiopulmonary process, including no evidence of pulmonary embolism or any evidence of infiltrate, edema, effusion, or pneumothorax.  This  CTA chest showed abnormal soft tissue at the thoracic inlet and upper mediastinum surrounding the trachea and esophagus, consistent with the patient's known history of cancer.  In the setting of the patient's mildly elevated troponin, EDP discussed patient's case with on-call cardiology, felt that the elevation was on the basis of supply demand mismatch in the context of presenting acute hypoxic respiratory failure.  Cardiology did not recommend initiation of heparin  drip at this time, rather recommended further trending of troponin.  While in the ED, the following were administered: Full dose aspirin  x 1, azithromycin , Rocephin , lactated Ringer 's at 1 L bolus followed by initiation continuous LR running at 150 cc/h.  Subsequently, the patient was admitted for further evaluation management of presenting acute hypoxic respiratory failure on the basis of suspected acute COPD exacerbation, with presenting labs notable for mildly elevated troponin, SIRS criteria as well as acute kidney injury.     Review of Systems: As per HPI otherwise 10 point review of systems negative.   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- last radiation 06-2018   Cataract    removed both eyes with l;ens implants    COPD (chronic obstructive pulmonary disease) (HCC)    Dyspnea    Family history of colon cancer    in his brother- dx'd age 36    History of chemotherapy    last 06-01-2018   History of kidney stones    History of radiation therapy    last radiation 06-2018   Hyperlipidemia    currently under control   Hypertension    Irregular heart beats    Lymphadenopathy    Pain, lower leg    Bilateral   Peripheral arterial disease (HCC)    Pre-diabetes    Red-green color blindness    RLS (restless legs syndrome)     Snores    Wears glasses     Past Surgical History:  Procedure Laterality Date   CATARACT EXTRACTION W/ INTRAOCULAR LENS  IMPLANT, BILATERAL     COLONOSCOPY     DIRECT LARYNGOSCOPY Right 02/03/2022   Procedure: DIRECT LARYNGOSCOPY WITH BIOPSY OF RIGHT FALSE VOCAL CORD;  Surgeon: Ammon Bales, MD;  Location: [facility] OR;  Service: ENT;  Laterality: Right;   dislodged salava stone     FRACTURE SURGERY     HAND ARTHROPLASTY  1995   crushed left hand   INGUINAL LYMPH NODE BIOPSY Left 01/02/2018   Procedure: LEFT INGUINAL LYMPH NODE BIOPSY;  Surgeon: Enid Harry, MD;  Location: [facility] OR;  Service: General;  Laterality: Left;   IR IMAGING GUIDED PORT INSERTION  01/15/2018   IR IMAGING GUIDED PORT INSERTION  08/10/2022   IR REMOVAL TUN ACCESS W/ PORT W/O FL MOD SED  03/11/2019   IR REPLACE G-TUBE SIMPLE WO FLUORO  06/21/2023   LAPAROSCOPIC INSERTION GASTROSTOMY TUBE N/A 06/19/2023   Procedure: LAPAROSCOPIC INSERTION GASTROSTOMY TUBE;  Surgeon: Junie Olds, MD;  Location: [facility] ORS;  Service: General;  Laterality: N/A;   LAPAROSCOPIC INSERTION GASTROSTOMY TUBE N/A 06/21/2023   Procedure: LAPAROSCOPIC INSERTION GASTROSTOMY TUBE;  Surgeon: Junie Olds, MD;  Location: [facility] ORS;  Service: General;  Laterality: N/A;   MEDIASTINOSCOPY N/A 07/22/2022   Procedure: MEDIASTINOSCOPY;  Surgeon: Zelphia Higashi, MD;  Location: [facility] OR;  Service: Thoracic;  Laterality: N/A;   MICROLARYNGOSCOPY Left 01/17/2014   Procedure: MICROLARYNGOSCOPY WITH EXCISION OF THE BIOPSY OF LEFT VOCAL CORD LESION;  Surgeon: Janita Mellow, MD;  Location: [facility];  Service: ENT;  Laterality: Left;   MICROLARYNGOSCOPY N/A 09/16/2020   Procedure: MICROLARYNGOSCOPY with Biopsy of vocal cord lesion;  Surgeon: Ammon Bales, MD;  Location: [facility] OR;  Service: ENT;  Laterality: N/A;   ORIF FOOT FRACTURE  2005   left   REFRACTIVE SURGERY Right    removed cloudiness in right eye after cataract removal      Social History:  reports that he quit smoking about 2 years ago. His smoking use included cigarettes. He started smoking about 38 years ago. He has a 18 pack-year smoking history. He has never used smokeless tobacco. He reports that he does not currently use alcohol after a past usage of about 6.0 standard drinks of alcohol per week. He reports current drug use. Drugs: , Marijuana, and Cocaine.   Allergies  Allergen Reactions   Bee Venom Anaphylaxis   Lisinopril  Other (See Comments)    Swelling in face   Antifungal [Miconazole Nitrate] Other (See Comments)     made his head hurt   Zolpidem Tartrate Er Other (See Comments)    Leg cramps    Family History  Problem Relation Age of Onset   Breast cancer Mother    Diabetes Father    Hypertension Father    Stroke Father    Mental illness Sister    Hypertension Daughter    Mental illness Daughter    Hypertension Brother    Colon cancer Brother 61       passed away 2018/12/01   Breast cancer Sister    Esophageal cancer Neg Hx    Colon polyps Neg Hx    Rectal cancer Neg Hx    Stomach cancer Neg Hx      Prior to Admission medications   Medication Sig Start Date End Date Taking? Authorizing Provider  ACCU-CHEK GUIDE test strip 1 each by Other route as needed for other. 01/05/23   [provider]  Accu-Chek Softclix Lancets lancets  01/05/23   [provider]  acetaminophen  (TYLENOL ) 160 MG/5ML solution Place 20.3 mLs (650 mg total) into feeding tube every 6 (six) hours as needed for mild pain (pain score 1-3). 06/20/23   Maczis, Michael M, PA-C  glycopyrrolate  (ROBINUL ) 1 MG tablet Take 1 tablet (1 mg total) by mouth every 4 (four) hours as needed (excessive secretions). 06/24/23   [place], Rema Care, DO  lidocaine -prilocaine  (EMLA ) cream Apply to affected area once 08/04/22   Kale, Gautam Kishore, MD  Nutritional Supplements (NUTREN 1.5) LIQD Give 2 cartons 4 times a day.  Flush with 30ml of water  before and after each  feeding.  Give additional 360ml of water  TID for additional hydration. 06/26/23   Frankie Israel, MD  oxyCODONE  (ROXICODONE ) 5 MG/5ML solution Place 5 mLs (5 mg total) into feeding tube every 6 (six) hours as needed for breakthrough pain. 06/20/23   Maczis, Michael M, PA-C  Water  For Irrigation, Sterile (FREE WATER ) SOLN Place 125 mLs into feeding tube every 4 (four) hours. 06/24/23 09/22/23  [place], Eric J, DO     Objective    Physical Exam: Vitals:  07/02/23 2215 07/02/23 2230 07/02/23 2238 07/02/23 2313  BP: 99/63 90/64 90/64  97/73  Pulse: (!) 108 (!) 116  (!) 112  Resp: 15 (!) 25  19  Temp:    98.8 F (37.1 C)  TempSrc:    Oral  SpO2: 99% 98% 100% 100%  Weight:      Height:        General: appears to be stated age; alert, oriented Skin: warm, dry, no rash Head:  AT/[place] Mouth:  Oral mucosa membranes appear dry, normal dentition Neck: supple; trachea midline Heart: Tachycardic, regular; did not appreciate any M/R/G Lungs: CTAB, did not appreciate any wheezes, rales, or rhonchi Abdomen: + BS; soft, ND, G-tube noted Vascular: 2+ pedal pulses b/l; 2+ radial pulses b/l Extremities: no peripheral edema, no muscle wasting Neuro: strength and sensation intact in upper and lower extremities b/l    Labs on Admission: I have personally reviewed following labs and imaging studies  CBC: Recent Labs  Lab 07/02/23 1847 07/02/23 1901  WBC 20.4*  --   NEUTROABS 14.7*  --   HGB 8.7* 10.2*  HCT 29.0* 30.0*  MCV 82.4  --   PLT 553*  --    Basic Metabolic Panel: Recent Labs  Lab 07/02/23 1847 07/02/23 1901  NA 131* 130*  K 4.9 4.9  CL 88*  --   CO2 30  --   GLUCOSE 281*  --   BUN 29*  --   CREATININE 1.24  --   CALCIUM  9.5  --    GFR: Estimated Creatinine Clearance: 74.1 mL/min (by C-G formula based on SCr of 1.24 mg/dL). Liver Function Tests: Recent Labs  Lab 07/02/23 1847  AST 30  ALT 35  ALKPHOS 118  BILITOT 0.7  PROT 8.6*  ALBUMIN 3.0*   No results  for input(s): "LIPASE", "AMYLASE" in the last 168 hours. No results for input(s): "AMMONIA" in the last 168 hours. Coagulation Profile: No results for input(s): "INR", "PROTIME" in the last 168 hours. Cardiac Enzymes: No results for input(s): "CKTOTAL", "CKMB", "CKMBINDEX", "TROPONINI" in the last 168 hours. BNP (last 3 results) No results for input(s): "PROBNP" in the last 8760 hours. HbA1C: No results for input(s): "HGBA1C" in the last 72 hours. CBG: No results for input(s): "GLUCAP" in the last 168 hours. Lipid Profile: No results for input(s): "CHOL", "HDL", "LDLCALC", "TRIG", "CHOLHDL", "LDLDIRECT" in the last 72 hours. Thyroid  Function Tests: No results for input(s): "TSH", "T4TOTAL", "FREET4", "T3FREE", "THYROIDAB" in the last 72 hours. Anemia Panel: No results for input(s): "VITAMINB12", "FOLATE", "FERRITIN", "TIBC", "IRON", "RETICCTPCT" in the last 72 hours. Urine analysis:    Component Value Date/Time   COLORURINE YELLOW 08/19/2022 1248   APPEARANCEUR CLEAR 08/19/2022 1248   LABSPEC 1.021 08/19/2022 1248   PHURINE 5.0 08/19/2022 1248   GLUCOSEU 50 (A) 08/19/2022 1248   HGBUR NEGATIVE 08/19/2022 1248   BILIRUBINUR NEGATIVE 08/19/2022 1248   BILIRUBINUR negative 12/22/2017 1545   KETONESUR NEGATIVE 08/19/2022 1248   PROTEINUR 30 (A) 08/19/2022 1248   UROBILINOGEN 1.0 12/22/2017 1545   NITRITE NEGATIVE 08/19/2022 1248   LEUKOCYTESUR NEGATIVE 08/19/2022 1248    Radiological Exams on Admission: CT Angio Chest PE W/Cm &/Or Wo Cm Result Date: 07/02/2023 CLINICAL DATA:  Pulmonary embolism (PE) suspected, high prob. Shortness of breath EXAM: CT ANGIOGRAPHY CHEST WITH CONTRAST TECHNIQUE: Multidetector CT imaging of the chest was performed using the standard protocol during bolus administration of intravenous contrast. Multiplanar CT image reconstructions and MIPs were obtained to evaluate the vascular anatomy.  RADIATION DOSE REDUCTION: This exam was performed according to the  departmental dose-optimization program which includes automated exposure control, adjustment of the mA and/or kV according to patient size and/or use of iterative reconstruction technique. CONTRAST:  75mL OMNIPAQUE  IOHEXOL  350 MG/ML SOLN COMPARISON:  06/15/2023 FINDINGS: Cardiovascular: No filling defects in the pulmonary arteries to suggest pulmonary emboli. Heart is normal size. Aorta is normal caliber. Mediastinum/Nodes: Abnormal soft tissue at the thoracic inlet and extending into the upper mediastinum surrounding the trachea and esophagus corresponding to known laryngeal cancer. This narrows the trachea at the level of the thoracic inlet. Findings are similar to prior study. No adenopathy. Lungs/Pleura: Lungs are clear. No focal airspace opacities or suspicious nodules. No effusions. Upper Abdomen: No acute findings Musculoskeletal: Right upper chest wall Port-A-Cath remains in place. No acute bony abnormality. Review of the MIP images confirms the above findings. IMPRESSION: No evidence of pulmonary embolus. Abnormal soft tissue at the thoracic inlet and upper mediastinum surrounding the trachea and esophagus compatible with patient's known cancer. This narrows the tracheal lumen significantly at the level of the thoracic inlet. Electronically Signed   By: Janeece Mechanic M.D.   On: 07/02/2023 21:06   DG Chest Port 1 View Result Date: 07/02/2023 CLINICAL DATA:  Shortness of breath EXAM: PORTABLE CHEST 1 VIEW COMPARISON:  06/15/2023 FINDINGS: Lungs are clear.  No pleural effusion or pneumothorax. The heart is normal in size. Right chest power port terminates in the lower SVC. IMPRESSION: No acute cardiopulmonary disease. Electronically Signed   By: Zadie Herter M.D.   On: 07/02/2023 20:10      Assessment/Plan   Principal Problem:   Acute hypoxic respiratory failure (HCC) Active Problems:   Anemia of chronic disease   AKI (acute kidney injury) (HCC)   COPD with acute exacerbation (HCC)   SOB  (shortness of breath)   Elevated troponin   SIRS (systemic inflammatory response syndrome) (HCC)   Leukocytosis   History of cocaine abuse (HCC)     #) Acute hypoxic respiratory failure due to acute COPD exacerbation: in the context of a documented history of COPD, diagnosis of acute exacerbation on the basis of 3 days of worsening shortness of breath associated with new onset cough with evidence of increased work of breathing.  Additionally, in the context of no known history of baseline supplemental oxygen requirements, patient's initial oxygen saturation was noted by EMS to be in the 70s, ultimately improving on BiPAP before being transitioned to 4 L high flow nasal cannula, salter, with associated oxygen saturations in the high 90s to 100%, thereby meeting criteria for acute hypoxic respiratory failure.  In terms of factors that may have contributed to the patient's acute COPD exacerbation leading to acute hypoxic respiratory failure, he is at increased risk for aspiration in the setting of this laryngeal cancer, noting recent hospitalization for aspiration pneumonia.  Today CTA chest shows no overt evidence of infiltrate, but will further evaluate with procalcitonin level, and initiate Zosyn  in the meantime, with differential also noted to include aspiration pneumonitis.  Today CTA chest showed no evidence of acute pulmonary embolism or any evidence of pneumothorax.  No clinical or radiographic evidence to suggest acute decompensated heart failure.  Mildly elevated troponin appears most suggestive of type II supply/demand mismatch as a consequence of diminished oxygen delivery capacity resulting from his acute hypoxic respiratory failure, vomiting that presentation is not associate with any recent chest pain and EKG shows no overt evidence of acute ischemic changes.  Will continue  to trend troponin and pursue updated echocardiogram.  COVID, influenza, RSV PCR all negative.  Presenting i-STAT  VBG shows compensated respiratory acidosis without respiratory acidemia, suggestive of chronic CO2 retention.    Plan: Monitor on telemetry. Solumedrol. Scheduled ipratropium/Xopenex  nebulizers q6 hours. Prn Xopenex  nebulized.  CMP in the morning. Repeat CBC in the morning. Check serum Mg and Phos levels. Will attempt additional chart review to evaluate most recent PFT results.  Add on procalcitonin level.  Zosyn  in the meantime.  Add-on procalcitonin.  Trend troponin.  Echocardiogram in the morning.  Keep n.p.o. and resume home tube feeds.                  #) Elevated troponin: mildly elevated initial troponin of 20 with repeat value trending up slightly at 205, relative to most recent prior high sensitive troponin I value of 9 on 06/15/2023.  Suspect that this mildly elevated troponin is on the basis of supply demand mismatch in the setting of diminished oxygen delivery capacity stemming from his presenting acute hypoxic respiratory failure as opposed to representing a type I process due to acute plaque rupture. Additionally, relative decline in renal clearance of troponin in the setting of AKI is likely also contributing to presenting troponin elevation. EKG shows no evidence of acute ischemic changes, including no evidence of STEMI.  Additionally, presentation is not associated with any CP.  Overall, ACS is felt to be less likely relative to type 2 supply demand mismatch, as above, but will closely monitor on telemetry overnight, and trending troponin further.  As noted above, CTA chest showed no evidence of acute pulmonary embolism or any evidence of pneumothorax.  He did receive full dose aspirin  via his G-tube in the ED today.  EDP discussed patient's case with on-call cardiology, they felt that presentation is more suggestive of state due to supply/demand mismatch, and recommended further trending troponin in the absence of initiation of heparin  drip.  Plan: repeat troponin in the AM.  Monitor on telemetry. PRN EKG for development of chest pain. PRN sublingual nitroglycerin  for CP. Check serum Mg level and repeat CMP in the morning.  Repeat CBC in the AM. Additional evaluation and management of presenting acute hypoxic respiratory failure as suspected driving force behind mildly elevated troponin, as above.  Echocardiogram ordered for the morning.  Given the patient's history of cocaine abuse, will also check urinary drug screen.                  #) SIRS criteria present: Presentation associated with elevated white blood cell count of 20,000, with tachycardia, tachypnea.  However, in the absence of e/o underlying infection at this time, including, CTA chest which showed no evidence of infiltrate, while COVID, evidence of, RSV PCR were all negative criteria for sepsis not currently met.  Procalcitonin level to further assess, given the patient's risk for aspiration pneumonia, with differential also including potential contribution from aspiration pneumonitis.  Also check urinalysis.  Pending this additional evaluation, will continue Zosyn  with its anaerobic coverage for now.  Of note, lactic acid level x 2 were nonelevated and trending down.  Blood cultures x 2 were collected prior to initiation antibiotics in the ED today.  Plan: Repeat CBC with diff in the AM.  Monitor strict I's and O's and daily weights.  Monitor on telemetry.  Start Zosyn , as above.  Add on procalcitonin level.  Check urinalysis.                  #)  Acute Kidney Injury: Presenting creatinine 1.24 compared to most recent prior value of 0.79 on 06/24/2023.  Suspect prerenal contribution from diminished renal perfusion as a consequence of decline in oxygen delivery capacity associated with his presenting acute hypoxic respiratory failure, as above.  Plan: monitor strict I's & O's and daily weights. Attempt to avoid nephrotoxic agents. Refrain from NSAIDs. Repeat CMP in the morning. Check  serum magnesium  level.  Check urinalysis with microscopy.  Add-on random urine sodium and random urine creatinine.  Further evaluation management of presenting acute Evoxac respiratory failure, as above.  Continuous IV fluids, as above.                #) History of cocaine abuse: Per chart review, patient with a documented history of cocaine abuse which is notable given his initial sinus tachycardia, acute hypoxic respiratory failure and acute kidney injury.  Urine drug screen, and refrain from use of selective beta 1 antagonist as a result of urine drug screen.  Plan: Urinary drug screen, as above.                 #) Anemia of chronic disease: Documented history of such, a/w with baseline hgb range 8-11, with presenting hgb consistent with this range, in the absence of any overt evidence of active bleed.     Plan: Repeat CBC in the morning.     DVT prophylaxis: SCD's   Code Status: Full code Family Communication: none Disposition Plan: Per Rounding Team Consults called: EDP d/w on cardiology, as further detailed above-call ;  Admission status: Inpatient     I SPENT GREATER THAN 75  MINUTES IN CLINICAL CARE TIME/MEDICAL DECISION-MAKING IN COMPLETING THIS ADMISSION.      Gattis Kass Quinette Hentges DO [facility]  From 7PM - 7AM   07/02/2023, 11:27 PM

## 2023-07-02 NOTE — Progress Notes (Signed)
 RT note. Patient placed on bipap at this time. Patient with thick bloody clear secretions   07/02/23 1849  Vent Select  $ Ventilator Initial/Subsequent  Initial  Invasive or Noninvasive Noninvasive  Adult Vent Y  Vent start date 07/02/23  Vent start time 1850  Adult Ventilator Settings  Vent Type Servo i  Humidity HME  Vent Mode (S)  BIPAP;PCV  Set Rate 15 bmp  FiO2 (%) 40 %  I Time 0.9 Sec(s)  IPAP 14 cmH20  EPAP 6 cmH20  Pressure Control 8 cmH20  PEEP 6 cmH20  Adult Ventilator Measurements  Peak Airway Pressure 14 L/min  Mean Airway Pressure 10 cmH20  Resp Rate Spontaneous 24 br/min  Resp Rate Total 39 br/min  Exhaled Vt 612 mL  Measured Ve 11.8 L  I:E Ratio Measured 1:1.4  Auto PEEP 0 cmH20  Total PEEP 6 cmH20  SpO2 100 %  Adult Ventilator Alarms  Alarms On Y  Ve High Alarm 20 L/min  Ve Low Alarm 5 L/min  Resp Rate High Alarm 40 br/min  Resp Rate Low Alarm 10  PEEP Low Alarm 3 cmH2O  Press High Alarm 40 cmH2O  T Apnea 20 sec(s)

## 2023-07-02 NOTE — Progress Notes (Signed)
 PT taken off BIPAP and placed on HFNC 3L, pt vitals are stable, and pt claims he does not have SOB.     07/02/23 2238  Therapy Vitals  BP 90/64  Patient Position (if appropriate) Sitting  MEWS Score/Color  MEWS Score 3  MEWS Score Color Yellow  Oxygen Therapy/Pulse Ox  O2 Device HFNC  O2 Therapy Oxygen humidified  O2 Flow Rate (L/min) 3 L/min  SpO2 100 %

## 2023-07-03 ENCOUNTER — Encounter (HOSPITAL_COMMUNITY): Payer: Self-pay | Admitting: Internal Medicine

## 2023-07-03 ENCOUNTER — Inpatient Hospital Stay (HOSPITAL_COMMUNITY): Payer: PPO

## 2023-07-03 ENCOUNTER — Ambulatory Visit: Payer: PPO

## 2023-07-03 DIAGNOSIS — R7989 Other specified abnormal findings of blood chemistry: Secondary | ICD-10-CM

## 2023-07-03 DIAGNOSIS — J441 Chronic obstructive pulmonary disease with (acute) exacerbation: Secondary | ICD-10-CM | POA: Diagnosis present

## 2023-07-03 DIAGNOSIS — J9601 Acute respiratory failure with hypoxia: Secondary | ICD-10-CM | POA: Diagnosis not present

## 2023-07-03 DIAGNOSIS — R6510 Systemic inflammatory response syndrome (SIRS) of non-infectious origin without acute organ dysfunction: Secondary | ICD-10-CM | POA: Diagnosis present

## 2023-07-03 DIAGNOSIS — D72829 Elevated white blood cell count, unspecified: Secondary | ICD-10-CM | POA: Diagnosis present

## 2023-07-03 DIAGNOSIS — F1411 Cocaine abuse, in remission: Secondary | ICD-10-CM | POA: Diagnosis present

## 2023-07-03 DIAGNOSIS — C329 Malignant neoplasm of larynx, unspecified: Secondary | ICD-10-CM | POA: Diagnosis not present

## 2023-07-03 DIAGNOSIS — R0602 Shortness of breath: Secondary | ICD-10-CM | POA: Diagnosis present

## 2023-07-03 LAB — ECHOCARDIOGRAM COMPLETE
AR max vel: 2.61 cm2
AV Peak grad: 11 mm[Hg]
Ao pk vel: 1.66 m/s
Area-P 1/2: 5.54 cm2
Height: 72 in
S' Lateral: 3 cm
Weight: 3880.1 [oz_av]

## 2023-07-03 LAB — COMPREHENSIVE METABOLIC PANEL
ALT: 29 U/L (ref 0–44)
AST: 28 U/L (ref 15–41)
Albumin: 2.8 g/dL — ABNORMAL LOW (ref 3.5–5.0)
Alkaline Phosphatase: 99 U/L (ref 38–126)
Anion gap: 12 (ref 5–15)
BUN: 31 mg/dL — ABNORMAL HIGH (ref 8–23)
CO2: 29 mmol/L (ref 22–32)
Calcium: 9.3 mg/dL (ref 8.9–10.3)
Chloride: 90 mmol/L — ABNORMAL LOW (ref 98–111)
Creatinine, Ser: 1.34 mg/dL — ABNORMAL HIGH (ref 0.61–1.24)
GFR, Estimated: 58 mL/min — ABNORMAL LOW (ref >60)
Glucose, Bld: 195 mg/dL — ABNORMAL HIGH (ref 70–99)
Potassium: 5.4 mmol/L — ABNORMAL HIGH (ref 3.5–5.1)
Sodium: 131 mmol/L — ABNORMAL LOW (ref 135–145)
Total Bilirubin: 0.8 mg/dL (ref 0.0–1.2)
Total Protein: 8 g/dL (ref 6.5–8.1)

## 2023-07-03 LAB — URINALYSIS, COMPLETE (UACMP) WITH MICROSCOPIC
Bacteria, UA: NONE SEEN
Bilirubin Urine: NEGATIVE
Glucose, UA: NEGATIVE mg/dL
Hgb urine dipstick: NEGATIVE
Ketones, ur: NEGATIVE mg/dL
Leukocytes,Ua: NEGATIVE
Nitrite: NEGATIVE
Protein, ur: NEGATIVE mg/dL
Specific Gravity, Urine: 1.028 (ref 1.005–1.030)
pH: 7 (ref 5.0–8.0)

## 2023-07-03 LAB — HEMOGLOBIN AND HEMATOCRIT, BLOOD
HCT: 25.8 % — ABNORMAL LOW (ref 39.0–52.0)
Hemoglobin: 7.7 g/dL — ABNORMAL LOW (ref 13.0–17.0)

## 2023-07-03 LAB — CBC
HCT: 26.1 % — ABNORMAL LOW (ref 39.0–52.0)
Hemoglobin: 7.7 g/dL — ABNORMAL LOW (ref 13.0–17.0)
MCH: 24.4 pg — ABNORMAL LOW (ref 26.0–34.0)
MCHC: 29.5 g/dL — ABNORMAL LOW (ref 30.0–36.0)
MCV: 82.9 fL (ref 80.0–100.0)
Platelets: 492 10*3/uL — ABNORMAL HIGH (ref 150–400)
RBC: 3.15 MIL/uL — ABNORMAL LOW (ref 4.22–5.81)
RDW: 14.9 % (ref 11.5–15.5)
WBC: 12.1 10*3/uL — ABNORMAL HIGH (ref 4.0–10.5)
nRBC: 0 % (ref 0.0–0.2)

## 2023-07-03 LAB — RAPID URINE DRUG SCREEN, HOSP PERFORMED
Amphetamines: NOT DETECTED
Barbiturates: NOT DETECTED
Benzodiazepines: NOT DETECTED
Cocaine: NOT DETECTED
Opiates: NOT DETECTED
Tetrahydrocannabinol: NOT DETECTED

## 2023-07-03 LAB — PROCALCITONIN: Procalcitonin: 2.24 ng/mL

## 2023-07-03 LAB — TROPONIN I (HIGH SENSITIVITY): Troponin I (High Sensitivity): 210 ng/L (ref <18)

## 2023-07-03 LAB — CBG MONITORING, ED
Glucose-Capillary: 278 mg/dL — ABNORMAL HIGH (ref 70–99)
Glucose-Capillary: 140 mg/dL — ABNORMAL HIGH (ref 70–99)
Glucose-Capillary: 314 mg/dL — ABNORMAL HIGH (ref 70–99)

## 2023-07-03 LAB — MAGNESIUM
Magnesium: 2.3 mg/dL (ref 1.7–2.4)
Magnesium: 2.3 mg/dL (ref 1.7–2.4)

## 2023-07-03 LAB — SODIUM, URINE, RANDOM: Sodium, Ur: 19 mmol/L

## 2023-07-03 LAB — PHOSPHORUS: Phosphorus: 4.7 mg/dL — ABNORMAL HIGH (ref 2.5–4.6)

## 2023-07-03 LAB — CREATININE, URINE, RANDOM: Creatinine, Urine: 77 mg/dL

## 2023-07-03 MED ORDER — FREE WATER
360.0000 mL | Freq: Three times a day (TID) | Status: DC
Start: 2023-07-03 — End: 2023-07-04
  Administered 2023-07-03 – 2023-07-04 (×4): 360 mL

## 2023-07-03 MED ORDER — PANTOPRAZOLE SODIUM 40 MG IV SOLR
40.0000 mg | Freq: Two times a day (BID) | INTRAVENOUS | Status: DC
  Administered 2023-07-03 – 2023-07-05 (×5): 40 mg via INTRAVENOUS
  Filled 2023-07-03 (×5): qty 10

## 2023-07-03 MED ORDER — LEVALBUTEROL HCL 1.25 MG/0.5ML IN NEBU
1.2500 mg | INHALATION_SOLUTION | Freq: Four times a day (QID) | RESPIRATORY_TRACT | Status: DC
  Administered 2023-07-04: 1.25 mg via RESPIRATORY_TRACT
  Filled 2023-07-03: qty 0.5

## 2023-07-03 MED ORDER — METHYLPREDNISOLONE SODIUM SUCC 125 MG IJ SOLR
80.0000 mg | Freq: Two times a day (BID) | INTRAMUSCULAR | Status: DC
  Administered 2023-07-03 – 2023-07-04 (×3): 80 mg via INTRAVENOUS
  Filled 2023-07-03 (×3): qty 2

## 2023-07-03 MED ORDER — GLYCOPYRROLATE 1 MG PO TABS
1.0000 mg | ORAL_TABLET | ORAL | Status: DC
  Administered 2023-07-03 – 2023-07-05 (×2): 1 mg
  Filled 2023-07-03 (×3): qty 1

## 2023-07-03 MED ORDER — GUAIFENESIN 100 MG/5ML PO LIQD
5.0000 mL | Freq: Four times a day (QID) | ORAL | Status: DC
Start: 2023-07-03 — End: 2023-07-05
  Administered 2023-07-03 – 2023-07-05 (×6): 5 mL
  Filled 2023-07-03 (×7): qty 10

## 2023-07-03 MED ORDER — SODIUM ZIRCONIUM CYCLOSILICATE 5 G PO PACK
5.0000 g | PACK | Freq: Once | ORAL | Status: AC
  Administered 2023-07-03: 5 g
  Filled 2023-07-03: qty 1

## 2023-07-03 MED ORDER — ENSURE ENLIVE PO LIQD
474.0000 mL | Freq: Three times a day (TID) | ORAL | Status: DC
  Filled 2023-07-03: qty 474

## 2023-07-03 MED ORDER — ENSURE ENLIVE PO LIQD
474.0000 mL | Freq: Three times a day (TID) | ORAL | Status: DC
Start: 2023-07-03 — End: 2023-07-04
  Administered 2023-07-03 – 2023-07-04 (×5): 474 mL
  Filled 2023-07-03: qty 474

## 2023-07-03 MED ORDER — INSULIN ASPART 100 UNIT/ML IJ SOLN
0.0000 [IU] | Freq: Four times a day (QID) | INTRAMUSCULAR | Status: DC
  Administered 2023-07-03: 3 [IU] via SUBCUTANEOUS
  Administered 2023-07-03 – 2023-07-04 (×2): 4 [IU] via SUBCUTANEOUS
  Administered 2023-07-04: 5 [IU] via SUBCUTANEOUS
  Administered 2023-07-04: 3 [IU] via SUBCUTANEOUS
  Administered 2023-07-04: 5 [IU] via SUBCUTANEOUS
  Administered 2023-07-05: 2 [IU] via SUBCUTANEOUS
  Administered 2023-07-05: 5 [IU] via SUBCUTANEOUS

## 2023-07-03 NOTE — ED Notes (Signed)
 Pt requesting feeding to be tonight,  or to hold off.

## 2023-07-03 NOTE — ED Notes (Signed)
 Just assumed care of patient. Patient laying in bed stable with family at bedside patient is in no noted distress at the present time will continue to monitor for any changes

## 2023-07-03 NOTE — ED Notes (Signed)
 Patient report called to [place] RN patient transported to [place] via [employer].

## 2023-07-03 NOTE — Progress Notes (Signed)
 Echocardiogram 2D Echocardiogram has been performed.  Lucendia Herrlich 07/03/2023, 4:22 PM

## 2023-07-03 NOTE — Progress Notes (Signed)
 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 Patient reports he has had increasing shortness of breath over the last 2 to 3 days., His breathing got significantly worse today. He has had an associated cough and today started to cough up large blood clots. EMS reports on their arrival he was sitting on the front step in respiratory distress and was hypoxic to the 70s on room air. They did place him in CPAP on route.   the patient was admitted for further evaluation management of presenting acute hypoxic respiratory failure on the basis of suspected acute COPD exacerbation, with presenting labs notable for mildly elevated troponin, SIRS criteria as well as acute kidney injury.   Assessment and Plan:    Acute hypoxic respiratory failure due to acute COPD exacerbation with possible aspiration PNA vs environmental exposure -patient's initial oxygen saturation was noted by EMS to be in the 70s, ultimately improving on BiPAP before being transitioned to 4 L high flow nasal cannula, salter, with associated oxygen saturations in the high 90s to 100%- wean to RA as able  -CTA chest showed no evidence of acute pulmonary embolism or any evidence of pneumothorax  -COVID, influenza, RSV PCR all negative.  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 Pollyann Kennedy and medical oncology, Dr. Candise Che outpatient (added to treatment team) -s/p PEG   Hyperkalemia -lokelma x 1 -repeat labs     Elevated troponin:  -mildly elevated initial troponin of 20 with repeat value trending up slightly at 205, -cards consulted -echo pending    Acute Kidney Injury:   - creatinine 1.34 compared to most recent prior value of 0.79 on 06/24/2023.   -Suspect prerenal  -IVF       History of cocaine abuse:  -UDS negative     Anemia of chronic disease:  -baseline hgb range 8-11, --daily labs  -transfuse for < 7       DVT prophylaxis: SCDs Start: 07/02/23 2323    Code Status: Full Code Family Communication: at bedside  Disposition Plan:  Level of care: Progressive Status is: Inpatient Remains inpatient appropriate     Consultants:  cards  Subjective: C/o acid reflux after 4 cartons of ensure given  Objective: Vitals:   07/03/23 0215 07/03/23 0433 07/03/23 0511 07/03/23 0700  BP: 96/62 (!) 100/59  117/69  Pulse: 97 90 91 95  Resp: 20 17 19    Temp:   98 F (36.7 C)   TempSrc:   Oral   SpO2: 100% 97% 100% 100%  Weight:      Height:       No intake or output data in the 24 hours ending 07/03/23 0749 Filed Weights   07/02/23 2200  Weight: 110 kg    Examination:   General: Appearance:    Obese male in no acute distress     Lungs:     Clear to auscultation bilaterally, respirations unlabored  Heart:    Normal heart rate. Normal rhythm. No murmurs, rubs, or gallops.    MS:   All extremities are intact.    Neurologic:   Awake, alert, oriented x 3. No apparent focal neurological  defect.        Data Reviewed: I have personally reviewed following labs and imaging studies  CBC: Recent Labs  Lab 07/02/23 1847 07/02/23 1901 07/03/23 0506  WBC 20.4*  --  12.1*  NEUTROABS 14.7*  --   --   HGB 8.7* 10.2* 7.7*  HCT 29.0* 30.0* 26.1*  MCV 82.4  --  82.9  PLT 553*  --  492*   Basic Metabolic Panel: Recent Labs  Lab 07/02/23 1847 07/02/23 1901 07/02/23 2126 07/03/23 0506  NA 131* 130*  --  131*  K 4.9 4.9  --  5.4*  CL 88*  --   --  90*  CO2 30  --   --  29  GLUCOSE 281*  --   --  195*  BUN 29*  --   --  31*  CREATININE 1.24  --   --  1.34*  CALCIUM 9.5  --   --  9.3  MG  --   --  2.3 2.3  PHOS  --    --   --  4.7*   GFR: Estimated Creatinine Clearance: 68.6 mL/min (A) (by C-G formula based on SCr of 1.34 mg/dL (H)). Liver Function Tests: Recent Labs  Lab 07/02/23 1847 07/03/23 0506  AST 30 28  ALT 35 29  ALKPHOS 118 99  BILITOT 0.7 0.8  PROT 8.6* 8.0  ALBUMIN 3.0* 2.8*   No results for input(s): "LIPASE", "AMYLASE" in the last 168 hours. No results for input(s): "AMMONIA" in the last 168 hours. Coagulation Profile: No results for input(s): "INR", "PROTIME" in the last 168 hours. Cardiac Enzymes: No results for input(s): "CKTOTAL", "CKMB", "CKMBINDEX", "TROPONINI" in the last 168 hours. BNP (last 3 results) No results for input(s): "PROBNP" in the last 8760 hours. HbA1C: No results for input(s): "HGBA1C" in the last 72 hours. CBG: Recent Labs  Lab 07/03/23 0645  GLUCAP 278*   Lipid Profile: No results for input(s): "CHOL", "HDL", "LDLCALC", "TRIG", "CHOLHDL", "LDLDIRECT" in the last 72 hours. Thyroid Function Tests: No results for input(s): "TSH", "T4TOTAL", "FREET4", "T3FREE", "THYROIDAB" in the last 72 hours. Anemia Panel: No results for input(s): "VITAMINB12", "FOLATE", "FERRITIN", "TIBC", "IRON", "RETICCTPCT" in the last 72 hours. Sepsis Labs: Recent Labs  Lab 07/02/23 1907 07/02/23 2141 07/03/23 0506  PROCALCITON  --   --  2.24  LATICACIDVEN 1.1 1.0  --     Recent Results (from the past 240 hours)  Resp panel by RT-PCR (RSV, Flu A&B, Covid) Anterior Nasal Swab     Status: None   Collection Time: 07/02/23  6:47 PM   Specimen: Anterior Nasal Swab  Result Value Ref Range Status   SARS Coronavirus 2 by RT PCR NEGATIVE NEGATIVE Final   Influenza A by PCR NEGATIVE NEGATIVE Final   Influenza B by PCR NEGATIVE NEGATIVE Final    Comment: (NOTE) The Xpert Xpress SARS-CoV-2/FLU/RSV plus assay is intended as an aid in the diagnosis of influenza from Nasopharyngeal swab specimens and should not be used as a sole basis for treatment. Nasal washings and aspirates  are unacceptable for Xpert Xpress SARS-CoV-2/FLU/RSV testing.  Fact Sheet for Patients: BloggerCourse.com  Fact Sheet for Healthcare Providers: SeriousBroker.it  This test is not yet approved or cleared by the [place] FDA and has been authorized for detection and/or diagnosis of SARS-CoV-2 by FDA under an Emergency Use Authorization (EUA). This EUA will remain in effect (meaning this test can be used) for the duration of the COVID-19 declaration under Section 564(b)(1)  of the Act, 21 U.S.C. section 360bbb-3(b)(1), unless the authorization is terminated or revoked.     Resp Syncytial Virus by PCR NEGATIVE NEGATIVE Final    Comment: (NOTE) Fact Sheet for Patients: BloggerCourse.com  Fact Sheet for Healthcare Providers: SeriousBroker.it  This test is not yet approved or cleared by the [place] FDA and has been authorized for detection and/or diagnosis of SARS-CoV-2 by FDA under an Emergency Use Authorization (EUA). This EUA will remain in effect (meaning this test can be used) for the duration of the COVID-19 declaration under Section 564(b)(1) of the Act, 21 U.S.C. section 360bbb-3(b)(1), unless the authorization is terminated or revoked.  Performed at [facility], [address], [place], [place] [address]   Blood culture (routine x 2)     Status: None (Preliminary result)   Collection Time: 07/02/23  9:26 PM   Specimen: BLOOD  Result Value Ref Range Status   Specimen Description BLOOD LEFT ARM  Final   Special Requests   Final    BOTTLES DRAWN AEROBIC AND ANAEROBIC Blood Culture results may not be optimal due to an inadequate volume of blood received in culture bottles   Culture   Final    NO GROWTH < 12 HOURS Performed at [facility], [address] [place], [place] [address]    Report Status PENDING  Incomplete  Blood culture (routine x 2)      Status: None (Preliminary result)   Collection Time: 07/02/23  9:26 PM   Specimen: BLOOD  Result Value Ref Range Status   Specimen Description BLOOD SITE NOT SPECIFIED  Final   Special Requests   Final    BOTTLES DRAWN AEROBIC AND ANAEROBIC Blood Culture results may not be optimal due to an inadequate volume of blood received in culture bottles   Culture   Final    NO GROWTH < 12 HOURS Performed at [facility], [address] [place], [place] [address]    Report Status PENDING  Incomplete         Radiology Studies: CT Angio Chest PE W/Cm &/Or Wo Cm Result Date: 07/02/2023 CLINICAL DATA:  Pulmonary embolism (PE) suspected, high prob. Shortness of breath EXAM: CT ANGIOGRAPHY CHEST WITH CONTRAST TECHNIQUE: Multidetector CT imaging of the chest was performed using the standard protocol during bolus administration of intravenous contrast. Multiplanar CT image reconstructions and MIPs were obtained to evaluate the vascular anatomy. RADIATION DOSE REDUCTION: This exam was performed according to the departmental dose-optimization program which includes automated exposure control, adjustment of the mA and/or kV according to patient size and/or use of iterative reconstruction technique. CONTRAST:  75mL OMNIPAQUE IOHEXOL 350 MG/ML SOLN COMPARISON:  06/15/2023 FINDINGS: Cardiovascular: No filling defects in the pulmonary arteries to suggest pulmonary emboli. Heart is normal size. Aorta is normal caliber. Mediastinum/Nodes: Abnormal soft tissue at the thoracic inlet and extending into the upper mediastinum surrounding the trachea and esophagus corresponding to known laryngeal cancer. This narrows the trachea at the level of the thoracic inlet. Findings are similar to prior study. No adenopathy. Lungs/Pleura: Lungs are clear. No focal airspace opacities or suspicious nodules. No effusions. Upper Abdomen: No acute findings Musculoskeletal: Right upper chest wall Port-A-Cath remains in place. No acute  bony abnormality. Review of the MIP images confirms the above findings. IMPRESSION: No evidence of pulmonary embolus. Abnormal soft tissue at the thoracic inlet and upper mediastinum surrounding the trachea and esophagus compatible with patient's known cancer. This narrows the tracheal lumen significantly at  the level of the thoracic inlet. Electronically Signed   By: Charlett Nose M.D.   On: 07/02/2023 21:06   DG Chest Port 1 View Result Date: 07/02/2023 CLINICAL DATA:  Shortness of breath EXAM: PORTABLE CHEST 1 VIEW COMPARISON:  06/15/2023 FINDINGS: Lungs are clear.  No pleural effusion or pneumothorax. The heart is normal in size. Right chest power port terminates in the lower SVC. IMPRESSION: No acute cardiopulmonary disease. Electronically Signed   By: Charline Bills M.D.   On: 07/02/2023 20:10        Scheduled Meds:  feeding supplement  474 mL Per Tube TID AC & HS   free water  360 mL Per Tube TID   glycopyrrolate  1 mg Per Tube QODAY   insulin aspart  0-6 Units Subcutaneous Q6H   ipratropium  0.5 mg Nebulization Q6H   levalbuterol  1.25 mg Nebulization Q6H   methylPREDNISolone (SOLU-MEDROL) injection  80 mg Intravenous BID   Continuous Infusions:  lactated ringers 150 mL/hr at 07/03/23 0050   piperacillin-tazobactam (ZOSYN)  IV 3.375 g (07/03/23 0513)     LOS: 1 day    Time spent: 45 minutes spent on chart review, discussion with nursing staff, consultants, updating family and interview/physical exam; more than 50% of that time was spent in counseling and/or coordination of care.    Joseph Art, DO [facility] Available via Epic secure chat 7am-7pm After these hours, please refer to coverage provider listed on [email or web address] 07/03/2023, 7:49 AM

## 2023-07-03 NOTE — Progress Notes (Addendum)
 Rounding Note    Patient Name: Jesus [name] Date of Encounter: 07/03/2023   [facility] Cardiologist: Charlton Haws, MD   Subjective   Feels better, seen sitting up in the chair, daughter at bedside.  Inpatient Medications    Scheduled Meds:  feeding supplement  474 mL Per Tube TID AC & HS   free water  360 mL Per Tube TID   glycopyrrolate  1 mg Per Tube QODAY   insulin aspart  0-6 Units Subcutaneous Q6H   ipratropium  0.5 mg Nebulization Q6H   levalbuterol  1.25 mg Nebulization Q6H   methylPREDNISolone (SOLU-MEDROL) injection  80 mg Intravenous BID   pantoprazole (PROTONIX) IV  40 mg Intravenous Q12H   sodium zirconium cyclosilicate  5 g Per Tube Once   Continuous Infusions:  lactated ringers Stopped (07/03/23 0814)   piperacillin-tazobactam (ZOSYN)  IV Stopped (07/03/23 0814)   PRN Meds: acetaminophen **OR** acetaminophen, levalbuterol, melatonin, ondansetron (ZOFRAN) IV   Vital Signs    Vitals:   07/03/23 0433 07/03/23 0511 07/03/23 0700 07/03/23 0851  BP: (!) 100/59  117/69   Pulse: 90 91 95 91  Resp: 17 19    Temp:  98 F (36.7 C)    TempSrc:  Oral    SpO2: 97% 100% 100% 96%  Weight:      Height:       No intake or output data in the 24 hours ending 07/03/23 0911    07/02/2023   10:00 PM 06/23/2023    7:02 AM 06/22/2023    7:17 AM  Last 3 Weights  Weight (lbs) 242 lb 8.1 oz 242 lb 8.1 oz 236 lb 12.4 oz  Weight (kg) 110 kg 110 kg 107.4 kg      Telemetry    Sinus rhythm, sinus tachycardia - Personally Reviewed  ECG    Repeat pending - Personally Reviewed  Physical Exam   GEN: No acute distress.   Neck: No JVD Cardiac: RRR, no murmurs, rubs, or gallops.  Respiratory: rhonchi throughout GI: Soft, nontender, non-distended  MS: No edema; No deformity. Neuro:  Nonfocal  Psych: Normal affect   Labs    High Sensitivity Troponin:   Recent Labs  Lab 06/15/23 1916 07/02/23 1847 07/02/23 2126  TROPONINIHS 9 20* 205*      Chemistry Recent Labs  Lab 07/02/23 1847 07/02/23 1901 07/02/23 2126 07/03/23 0506  NA 131* 130*  --  131*  K 4.9 4.9  --  5.4*  CL 88*  --   --  90*  CO2 30  --   --  29  GLUCOSE 281*  --   --  195*  BUN 29*  --   --  31*  CREATININE 1.24  --   --  1.34*  CALCIUM 9.5  --   --  9.3  MG  --   --  2.3 2.3  PROT 8.6*  --   --  8.0  ALBUMIN 3.0*  --   --  2.8*  AST 30  --   --  28  ALT 35  --   --  29  ALKPHOS 118  --   --  99  BILITOT 0.7  --   --  0.8  GFRNONAA >60  --   --  58*  ANIONGAP 13  --   --  12    Lipids No results for input(s): "CHOL", "TRIG", "HDL", "LABVLDL", "LDLCALC", "CHOLHDL" in the last 168 hours.  Hematology Recent Labs  Lab  07/02/23 1847 07/02/23 1901 07/03/23 0506  WBC 20.4*  --  12.1*  RBC 3.52*  --  3.15*  HGB 8.7* 10.2* 7.7*  HCT 29.0* 30.0* 26.1*  MCV 82.4  --  82.9  MCH 24.7*  --  24.4*  MCHC 30.0  --  29.5*  RDW 14.8  --  14.9  PLT 553*  --  492*   Thyroid No results for input(s): "TSH", "FREET4" in the last 168 hours.  BNP Recent Labs  Lab 07/02/23 1847  BNP 27.6    DDimer No results for input(s): "DDIMER" in the last 168 hours.   Radiology    CT Angio Chest PE W/Cm &/Or Wo Cm Result Date: 07/02/2023 CLINICAL DATA:  Pulmonary embolism (PE) suspected, high prob. Shortness of breath EXAM: CT ANGIOGRAPHY CHEST WITH CONTRAST TECHNIQUE: Multidetector CT imaging of the chest was performed using the standard protocol during bolus administration of intravenous contrast. Multiplanar CT image reconstructions and MIPs were obtained to evaluate the vascular anatomy. RADIATION DOSE REDUCTION: This exam was performed according to the departmental dose-optimization program which includes automated exposure control, adjustment of the mA and/or kV according to patient size and/or use of iterative reconstruction technique. CONTRAST:  75mL OMNIPAQUE IOHEXOL 350 MG/ML SOLN COMPARISON:  06/15/2023 FINDINGS: Cardiovascular: No filling defects in the  pulmonary arteries to suggest pulmonary emboli. Heart is normal size. Aorta is normal caliber. Mediastinum/Nodes: Abnormal soft tissue at the thoracic inlet and extending into the upper mediastinum surrounding the trachea and esophagus corresponding to known laryngeal cancer. This narrows the trachea at the level of the thoracic inlet. Findings are similar to prior study. No adenopathy. Lungs/Pleura: Lungs are clear. No focal airspace opacities or suspicious nodules. No effusions. Upper Abdomen: No acute findings Musculoskeletal: Right upper chest wall Port-A-Cath remains in place. No acute bony abnormality. Review of the MIP images confirms the above findings. IMPRESSION: No evidence of pulmonary embolus. Abnormal soft tissue at the thoracic inlet and upper mediastinum surrounding the trachea and esophagus compatible with patient's known cancer. This narrows the tracheal lumen significantly at the level of the thoracic inlet. Electronically Signed   By: Charlett Nose M.D.   On: 07/02/2023 21:06   DG Chest Port 1 View Result Date: 07/02/2023 CLINICAL DATA:  Shortness of breath EXAM: PORTABLE CHEST 1 VIEW COMPARISON:  06/15/2023 FINDINGS: Lungs are clear.  No pleural effusion or pneumothorax. The heart is normal in size. Right chest power port terminates in the lower SVC. IMPRESSION: No acute cardiopulmonary disease. Electronically Signed   By: Charline Bills M.D.   On: 07/02/2023 20:10    Cardiac Studies   Echo pending  Patient Profile     68 y.o. male  with a hx of laryngeal cancer and B cell lymphoma on immunotherapy with PEG and hypertension who came to ED with SOB who is being seen for the evaluation of elevated trop   Assessment & Plan    Elevated troponin  - hs troponin 20 --> 205 - no heparin in the setting of bleeding and anemia - he reports no history of CAD or MI, no PCI, no stroke - he denies chest pain, breathing was his main complaint prompting admission   Hemoptysis  Acute on  chronic anemia Laryngeal squamous cell carcinoma - bleeding associated with known laryngeal cancer - Hb 10.2 --> 7.7, baseline near 8-11 - per primary - due for partial resection in march and possibly esophageal dilation    Hyperkalemia - K 4.9 -->  5.4 - lokelma  per primary   Acute hypoxic respiratory arrest COPD exacerbation AKI SIRS - started on ABX - will obtain echo to ensure no drop in LVEF that could be contributing    Sinus tachycardia - sinus rhythm - noted on telemetry - repeat EKG pending      For questions or updates, please contact [facility] Please consult [email or web address] for contact info under        Signed, Marcelino Duster, PA  07/03/2023, 9:11 AM

## 2023-07-03 NOTE — ED Notes (Signed)
 Pt tube flushed with of water, and per pt statement, "I need to wait 2 hours or so before I take more ensure."

## 2023-07-04 ENCOUNTER — Inpatient Hospital Stay: Payer: PPO

## 2023-07-04 ENCOUNTER — Inpatient Hospital Stay: Payer: PPO | Admitting: Dietician

## 2023-07-04 ENCOUNTER — Inpatient Hospital Stay: Payer: PPO | Admitting: Hematology

## 2023-07-04 DIAGNOSIS — C329 Malignant neoplasm of larynx, unspecified: Secondary | ICD-10-CM | POA: Diagnosis not present

## 2023-07-04 DIAGNOSIS — R7989 Other specified abnormal findings of blood chemistry: Secondary | ICD-10-CM | POA: Diagnosis not present

## 2023-07-04 DIAGNOSIS — J9601 Acute respiratory failure with hypoxia: Secondary | ICD-10-CM | POA: Diagnosis not present

## 2023-07-04 LAB — CBC
HCT: 23.9 % — ABNORMAL LOW (ref 39.0–52.0)
Hemoglobin: 7.3 g/dL — ABNORMAL LOW (ref 13.0–17.0)
MCH: 24.9 pg — ABNORMAL LOW (ref 26.0–34.0)
MCHC: 30.5 g/dL (ref 30.0–36.0)
MCV: 81.6 fL (ref 80.0–100.0)
Platelets: 484 10*3/uL — ABNORMAL HIGH (ref 150–400)
RBC: 2.93 MIL/uL — ABNORMAL LOW (ref 4.22–5.81)
RDW: 14.7 % (ref 11.5–15.5)
WBC: 7.8 10*3/uL (ref 4.0–10.5)
nRBC: 0 % (ref 0.0–0.2)

## 2023-07-04 LAB — PHOSPHORUS: Phosphorus: 4.1 mg/dL (ref 2.5–4.6)

## 2023-07-04 LAB — BASIC METABOLIC PANEL
Anion gap: 13 (ref 5–15)
BUN: 44 mg/dL — ABNORMAL HIGH (ref 8–23)
CO2: 30 mmol/L (ref 22–32)
Calcium: 9.5 mg/dL (ref 8.9–10.3)
Chloride: 91 mmol/L — ABNORMAL LOW (ref 98–111)
Creatinine, Ser: 1.26 mg/dL — ABNORMAL HIGH (ref 0.61–1.24)
GFR, Estimated: >60 mL/min (ref >60)
Glucose, Bld: 283 mg/dL — ABNORMAL HIGH (ref 70–99)
Potassium: 4.7 mmol/L (ref 3.5–5.1)
Sodium: 134 mmol/L — ABNORMAL LOW (ref 135–145)

## 2023-07-04 LAB — GLUCOSE, CAPILLARY
Glucose-Capillary: 268 mg/dL — ABNORMAL HIGH (ref 70–99)
Glucose-Capillary: 317 mg/dL — ABNORMAL HIGH (ref 70–99)
Glucose-Capillary: 342 mg/dL — ABNORMAL HIGH (ref 70–99)
Glucose-Capillary: 353 mg/dL — ABNORMAL HIGH (ref 70–99)

## 2023-07-04 LAB — PROCALCITONIN: Procalcitonin: 1.6 ng/mL

## 2023-07-04 LAB — MAGNESIUM: Magnesium: 2.7 mg/dL — ABNORMAL HIGH (ref 1.7–2.4)

## 2023-07-04 MED ORDER — IPRATROPIUM BROMIDE 0.02 % IN SOLN
0.5000 mg | Freq: Two times a day (BID) | RESPIRATORY_TRACT | Status: DC
  Administered 2023-07-04 – 2023-07-05 (×3): 0.5 mg via RESPIRATORY_TRACT
  Filled 2023-07-04 (×3): qty 2.5

## 2023-07-04 MED ORDER — FREE WATER
360.0000 mL | Freq: Three times a day (TID) | Status: DC
  Administered 2023-07-04 – 2023-07-05 (×3): 360 mL

## 2023-07-04 MED ORDER — LEVALBUTEROL HCL 1.25 MG/0.5ML IN NEBU
1.2500 mg | INHALATION_SOLUTION | Freq: Two times a day (BID) | RESPIRATORY_TRACT | Status: DC
  Administered 2023-07-04 – 2023-07-05 (×3): 1.25 mg via RESPIRATORY_TRACT
  Filled 2023-07-04 (×3): qty 0.5

## 2023-07-04 MED ORDER — PREDNISONE 20 MG PO TABS
40.0000 mg | ORAL_TABLET | Freq: Every day | ORAL | Status: DC
  Administered 2023-07-05: 40 mg
  Filled 2023-07-04: qty 2

## 2023-07-04 MED ORDER — OSMOLITE 1.5 CAL PO LIQD
474.0000 mL | Freq: Four times a day (QID) | ORAL | Status: DC
  Administered 2023-07-04 – 2023-07-05 (×3): 474 mL
  Filled 2023-07-04 (×4): qty 474

## 2023-07-04 MED ORDER — PROSOURCE TF20 ENFIT COMPATIBL EN LIQD
60.0000 mL | Freq: Every day | ENTERAL | Status: DC
  Administered 2023-07-05: 60 mL
  Filled 2023-07-04 (×2): qty 60

## 2023-07-04 NOTE — Progress Notes (Signed)
 Patient Name: NOELL SHULAR Date of Encounter: 07/04/2023 [facility] Cardiologist: Charlton Haws, MD   Interval Summary  .    Reported that his shortness of breath has improved significantly since arriving at the hospital. Denies any chest pain. [place] Spaniel that he seems to do well in the hospital but shortness of breath worsens at home. Reported some secondary cigarette exposure at home.  Vital Signs .    Vitals:   07/04/23 0042 07/04/23 0225 07/04/23 0438 07/04/23 0440  BP: 126/68  129/75   Pulse: 97  93   Resp: 19  18   Temp: 98.3 F (36.8 C)  (!) 97.5 F (36.4 C)   TempSrc: Oral  Oral   SpO2: 99% 94% 99%   Weight:    104.5 kg  Height:        Intake/Output Summary (Last 24 hours) at 07/04/2023 0752 Last data filed at 07/04/2023 0524 Gross per 24 hour  Intake 1513.33 ml  Output --  Net 1513.33 ml      07/04/2023    4:40 AM 07/03/2023    9:00 PM 07/02/2023   10:00 PM  Last 3 Weights  Weight (lbs) 230 lb 6.1 oz 220 lb 3.8 oz 242 lb 8.1 oz  Weight (kg) 104.5 kg 99.9 kg 110 kg      Telemetry/ECG     EKG from 07/03/23 reviewed no acute changes to prior EKG. RBBB, LAFB, T wave inversions on V1 and V2, sinus tachycardia - Personally Reviewed  Physical Exam .   GEN: No acute distress.   Cardiac: RRR, no murmurs, rubs, or gallops.  Respiratory: Wheezing heard through out the lungs.  MS: No edema  Assessment & Plan .    A 68 y.o. male with a past medical history of laryngeal cancer from B-cell lymphoma metastasis with a PEG tube who presented to the ER with worsening shortness of breath  Elevated troponin - hs troponin 20 --> 205.  Likely demand On admission had shortness of breath but no chest pain. Heparin was not started due to anemia and bleeding. Has no prior history of coronary artery disease. -  BNP was 27.6. - Had nuclear stress test on 09/21/21  for dyspnea on exertion that showed no ischemia.  - Suspect that elevated troponin's are secondary to demand  ischemia from the anemia. Echo found LVEF of 65-70% and no wall motion abnormalities.  - No plans for further inpatient ischemic workup. - will arrange outpatient follow up.     Hemoptysis  Acute on chronic anemia Laryngeal squamous cell carcinoma -- Has been coughing up large blood clots secondary to laryngeal cancer.  -- Hemoglobin has decreased to 7.3 today from 10.2 on 07/02/23.  -- Has a partial resection planned in march -- Managed as per primary      Hyperkalemia - K improved to 4.7 from 5.4 yesterday - manage as per primary     Acute hypoxic respiratory failure COPD exacerbation AKI SIRS - Echo yesterday showed a hyperdynamic LVEF of 65-70%, no regional wall motion abnormalities, G1DD, mild LVH, all valve structures were normal and stenosis and regurgitation were minimal. - Echo findings rule out heart failure as a cause of the shortness of breath. - Shortness of breath improved and 99% on room air.  - Manage as per primary     Sinus tachycardia - sinus rhythm - Continues to have sinus tachycardia on telemetry. Suspect this is secondary to anemia. - Repeat EKG showed no acute ischemic changes  For questions or updates, please contact [facility] Please consult [email or web address] for contact info under        Signed, Arabella Merles, PA-C     Mr. Centrella is a 68 year old male with history laryngeal cancer he noted significant shortness of breath at home but feels much better here.  He is on room air and is oxygen saturation is normal.  He has no signs of heart failure or ischemic disease.  Shortness of breath may be an upper airway issue with his laryngeal cancer.  He has sinus tachycardia with bifascicular block.  No ischemic changes. Otherwise from a cardiac perspective there are no plans for further workup.  We can see him as an outpatient.  Vitals:   07/04/23 0845 07/04/23 0908  BP:  117/74  Pulse:  98  Resp:  18  Temp:  97.8 F (36.6 C)  SpO2: 98% 98%    Physical Exam Gen: well appearing Neuro: alert and oriented CV: r,r,r no murmurs.  Pulm: CLAB Abd: non distended Ext: No LE edema Skin: warm and well perfused Psych: normal mood

## 2023-07-04 NOTE — Progress Notes (Signed)
 At 1953, pt seen and given his scheduled neb treatment which he tolerated well.  Pt was found on room air, HR100, RR16-19, spo2 93%.  No increased wob / respiratory distress noted or voiced by patient.  Bipap not indicated at this time.

## 2023-07-04 NOTE — Inpatient Diabetes Management (Signed)
 Inpatient Diabetes Program Recommendations  AACE/ADA: New Consensus Statement on Inpatient Glycemic Control (2015)  Target Ranges:  Prepandial:   less than 140 mg/dL      Peak postprandial:   less than 180 mg/dL (1-2 hours)      Critically ill patients:  140 - 180 mg/dL    Latest Reference Range & Units 07/03/23 06:45 07/03/23 11:39 07/03/23 18:04 07/04/23 00:39  Glucose-Capillary 70 - 99 mg/dL 409 (H)  3 units Novolog  140 (H) 314 (H)  4 units Novolog  342 (H)  5 units Novolog   (H): Data is abnormally high  Latest Reference Range & Units 07/04/23 06:01  Glucose-Capillary 70 - 99 mg/dL 811 (H)  4 units Novolog   (H): Data is abnormally high     Current Orders: Novolog 0-6 units Q6 hours   MD- Note pt getting Solumedrol 80 mg BID CBG 317 this AM Getting Ensure 474 ml TID AC + HS (5am, 9am, 2pm, 7pm)  Please consider:  1. Start weight based Basal Insulin: Semglee 10 units daily (0.1 units/kg)  2. Adjust the Novolog SSI to the 0-9 unit Scale and change the timing of the Novolog SSI to coincide with the Ensure Feedings: 5am, 9am, 2pm, 7pm     --Will follow patient during hospitalization--  Ambrose Finland RN, MSN, CDCES Diabetes Coordinator Inpatient Glycemic Control Team Team Pager: [phone] (8a-5p)

## 2023-07-04 NOTE — Progress Notes (Signed)
 Scheduled to see pt during infusion. Chart reviewed. Patient currently hospitalized. Rescheduled nutrition follow-up. Will plan to see pt 3/18 during infusion.

## 2023-07-04 NOTE — Progress Notes (Signed)
 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 Patient reports he has had increasing shortness of breath over the last 2 to 3 days., His breathing got significantly worse today. He has had an associated cough and today started to cough up large blood clots. EMS reports on their arrival he was sitting on the front step in respiratory distress and was hypoxic to the 70s on room air. They did place him in CPAP on route. The patient was admitted for further evaluation management of presenting acute hypoxic respiratory failure on the basis of suspected acute COPD exacerbation, with presenting labs notable for mildly elevated troponin, SIRS criteria as well as acute kidney injury.    Assessment and Plan:  Acute hypoxic respiratory failure due to acute COPD exacerbation -Working diagnosis.  Initial oxygen saturation was in the 70s as noted by EMS. -CTA chest showed no evidence of acute pulmonary embolism or any evidence of pneumothorax  -COVID, influenza, RSV PCR all negative. -Started on Zosyn presumably for concern for pneumonia.  However he has had no recent cough or fevers or chills. -Much improved today.  He believes that this was all caused by phlegm that blocked his ability to breathe.  He was able to cough this up after being placed on BiPAP and immediately felt better.  That actually sounds plausible as the mass in his neck has imposed upon his trachea. -Regardless still treating as COPD exacerbation.  He is essentially back to baseline today.  Will watch off antibiotics tonight if he is doing well can likely discharge home tomorrow.  Looks great on exam today.  Up walking around his room easily. 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 Pollyann Kennedy and medical oncology, Dr. Candise Che outpatient (added to treatment team) -s/p PEG   Hyperkalemia -lokelma x 1 -Potassium normal today.     Elevated troponin:  -mildly elevated initial troponin of 20 with repeat value trending up slightly at 205, -Cardiology has seen.  No evidence of ACS.  Troponin elevation minimal and deemed likely demand ischemia.  Echo essentially normal.  No further cardiology workup or need to follow.   Acute Kidney Injury:  - creatinine 1.34 compared to most recent prior value of 0.79 on 06/24/2023.   -Suspect prerenal  -On IV fluids and fluids per G-tube. -Downtrending today.  Will follow.    Anemia of chronic disease:  -baseline hgb range 8-11, --daily labs  -transfuse for < 7     Nutrition Problem: Moderate Malnutrition Etiology: chronic illness (metastatic laryngeal SCC stage IV) Signs/Symptoms: mild muscle depletion, percent weight loss (29% weight loss in 1 year) Percent weight loss: 29 % (in 1 year) Interventions: Refer to RD note for recommendations, Tube feeding   DVT prophylaxis:  SCDs Start: 07/02/23 2323  Code Status:   Code Status: Full Code Level of care: Progressive Status is: Inpatient Remains inpatient appropriate because: COPD exacerbation   Consults called: Cardiology  Subjective: Patient feels great today.  When I entered the room he was up walking around the room.  Denies any shortness of breath.  Oxygen was off and sats were high 90s.  Reports that he coughed up episode of bloody phlegm that was thick after being placed on BiPAP and felt much better immediately afterwards and his breathing was back to baseline afterwards.  No other complaints or concerns today.  Objective: Vitals:   07/04/23 0440 07/04/23 0845 07/04/23 0908 07/04/23 1357  BP:   117/74 131/74   Pulse:   98 94  Resp:   18 18  Temp:   97.8 F (36.6 C) 98 F (36.7 C)  TempSrc:   Oral Oral  SpO2:  98% 98% 98%  Weight: 104.5 kg     Height:        Intake/Output Summary (Last 24 hours) at 07/04/2023 1514 Last data filed at 07/04/2023 0524 Gross per 24 hour  Intake 1513.33 ml  Output --  Net 1513.33 ml   Filed Weights   07/02/23 2200 07/03/23 2100 07/04/23 0440  Weight: 110 kg 99.9 kg 104.5 kg   Body mass index is 31.25 kg/m.  Gen: 68 y.o. male in no apparent distress.  Nontoxic Pulm: Non-labored breathing.  Minimal wheezing at bases but otherwise good. CV: Regular rate and rhythm. No murmur, rub, or gallop. No JVD GI: Abdomen soft, non-tender, non-distended.  G-tube in place with no leakage. Ext: Warm, no deformities, no pedal edema Skin: No rashes, lesions  Neuro: Alert and oriented. No focal neurological deficits. Psych: Calm  Judgement and insight appear normal. Mood & affect appropriate.     I have personally reviewed the following labs and images: CBC: Recent Labs  Lab 07/02/23 1847 07/02/23 1901 07/03/23 0506 07/03/23 2056 07/04/23 0354  WBC 20.4*  --  12.1*  --  7.8  NEUTROABS 14.7*  --   --   --   --   HGB 8.7* 10.2* 7.7* 7.7* 7.3*  HCT 29.0* 30.0* 26.1* 25.8* 23.9*  MCV 82.4  --  82.9  --  81.6  PLT 553*  --  492*  --  484*   BMP &GFR Recent Labs  Lab 07/02/23 1847 07/02/23 1901 07/02/23 2126 07/03/23 0506 07/04/23 0354  NA 131* 130*  --  131* 134*  K 4.9 4.9  --  5.4* 4.7  CL 88*  --   --  90* 91*  CO2 30  --   --  29 30  GLUCOSE 281*  --   --  195* 283*  BUN 29*  --   --  31* 44*  CREATININE 1.24  --   --  1.34* 1.26*  CALCIUM 9.5  --   --  9.3 9.5  MG  --   --  2.3 2.3  --   PHOS  --   --   --  4.7*  --    Estimated Creatinine Clearance: 71.1 mL/min (A) (by C-G formula based on SCr of 1.26 mg/dL (H)). Liver & Pancreas: Recent Labs  Lab 07/02/23 1847 07/03/23 0506  AST 30 28  ALT 35 29  ALKPHOS 118 99  BILITOT 0.7 0.8  PROT  8.6* 8.0  ALBUMIN 3.0* 2.8*   No results for input(s): "LIPASE", "AMYLASE" in the last 168 hours. No results for input(s): "AMMONIA" in the last 168 hours. Diabetic: No results for input(s): "HGBA1C" in the last 72 hours. Recent Labs  Lab 07/03/23 1139 07/03/23 1804 07/04/23 0039 07/04/23 0601 07/04/23 1253  GLUCAP 140* 314* 342* 317* 353*   Cardiac Enzymes: No results for input(s): "CKTOTAL", "CKMB", "CKMBINDEX", "TROPONINI" in the last 168 hours. No results for input(s): "PROBNP" in the last 8760  hours. Coagulation Profile: No results for input(s): "INR", "PROTIME" in the last 168 hours. Thyroid Function Tests: No results for input(s): "TSH", "T4TOTAL", "FREET4", "T3FREE", "THYROIDAB" in the last 72 hours. Lipid Profile: No results for input(s): "CHOL", "HDL", "LDLCALC", "TRIG", "CHOLHDL", "LDLDIRECT" in the last 72 hours. Anemia Panel: No results for input(s): "VITAMINB12", "FOLATE", "FERRITIN", "TIBC", "IRON", "RETICCTPCT" in the last 72 hours. Urine analysis:    Component Value Date/Time   COLORURINE YELLOW 07/03/2023 0515   APPEARANCEUR CLEAR 07/03/2023 0515   LABSPEC 1.028 07/03/2023 0515   PHURINE 7.0 07/03/2023 0515   GLUCOSEU NEGATIVE 07/03/2023 0515   HGBUR NEGATIVE 07/03/2023 0515   BILIRUBINUR NEGATIVE 07/03/2023 0515   BILIRUBINUR negative 12/22/2017 1545   KETONESUR NEGATIVE 07/03/2023 0515   PROTEINUR NEGATIVE 07/03/2023 0515   UROBILINOGEN 1.0 12/22/2017 1545   NITRITE NEGATIVE 07/03/2023 0515   LEUKOCYTESUR NEGATIVE 07/03/2023 0515   Sepsis Labs: Invalid input(s): "PROCALCITONIN", "LACTICIDVEN"  Microbiology: Recent Results (from the past 240 hours)  Resp panel by RT-PCR (RSV, Flu A&B, Covid) Anterior Nasal Swab     Status: None   Collection Time: 07/02/23  6:47 PM   Specimen: Anterior Nasal Swab  Result Value Ref Range Status   SARS Coronavirus 2 by RT PCR NEGATIVE NEGATIVE Final   Influenza A by PCR NEGATIVE NEGATIVE Final   Influenza B by  PCR NEGATIVE NEGATIVE Final    Comment: (NOTE) The Xpert Xpress SARS-CoV-2/FLU/RSV plus assay is intended as an aid in the diagnosis of influenza from Nasopharyngeal swab specimens and should not be used as a sole basis for treatment. Nasal washings and aspirates are unacceptable for Xpert Xpress SARS-CoV-2/FLU/RSV testing.  Fact Sheet for Patients: BloggerCourse.com  Fact Sheet for Healthcare Providers: SeriousBroker.it  This test is not yet approved or cleared by the [place] FDA and has been authorized for detection and/or diagnosis of SARS-CoV-2 by FDA under an Emergency Use Authorization (EUA). This EUA will remain in effect (meaning this test can be used) for the duration of the COVID-19 declaration under Section 564(b)(1) of the Act, 21 U.S.C. section 360bbb-3(b)(1), unless the authorization is terminated or revoked.     Resp Syncytial Virus by PCR NEGATIVE NEGATIVE Final    Comment: (NOTE) Fact Sheet for Patients: BloggerCourse.com  Fact Sheet for Healthcare Providers: SeriousBroker.it  This test is not yet approved or cleared by the [place] FDA and has been authorized for detection and/or diagnosis of SARS-CoV-2 by FDA under an Emergency Use Authorization (EUA). This EUA will remain in effect (meaning this test can be used) for the duration of the COVID-19 declaration under Section 564(b)(1) of the Act, 21 U.S.C. section 360bbb-3(b)(1), unless the authorization is terminated or revoked.  Performed at [facility], [address] [place], [place] [address]   Blood culture (routine x 2)     Status: None (Preliminary result)   Collection Time: 07/02/23  9:26 PM   Specimen: BLOOD  Result Value Ref Range Status   Specimen Description BLOOD LEFT ARM  Final   Special Requests   Final    BOTTLES DRAWN AEROBIC AND ANAEROBIC Blood Culture results may not  be optimal due to an inadequate volume of blood received in culture bottles   Culture   Final    NO GROWTH 2 DAYS Performed at [facility], [address], [place], [place] [address]    Report Status PENDING  Incomplete  Blood culture (routine x 2)     Status: None (Preliminary result)  Collection Time: 07/02/23  9:26 PM   Specimen: BLOOD  Result Value Ref Range Status   Specimen Description BLOOD SITE NOT SPECIFIED  Final   Special Requests   Final    BOTTLES DRAWN AEROBIC AND ANAEROBIC Blood Culture results may not be optimal due to an inadequate volume of blood received in culture bottles   Culture   Final    NO GROWTH 2 DAYS Performed at [facility], [address], [place], [place] [address]    Report Status PENDING  Incomplete    Radiology Studies: ECHOCARDIOGRAM COMPLETE Result Date: 07/03/2023    ECHOCARDIOGRAM REPORT   Patient Name:   Jesus [name] Date of Exam: 07/03/2023 Medical Rec #:  [id number]      Height:       72.0 in Accession #:    [id number]     Weight:       242.5 lb Date of Birth:  December 18, 1955     BSA:          2.312 m Patient Age:    67 years       BP:           117/69 mmHg Patient Gender: M              HR:           96 bpm. Exam Location:  [facility] Procedure: 2D Echo, Cardiac Doppler and Color Doppler (Both Spectral and Color            Flow Doppler were utilized during procedure). Indications:    Elevated Troponin  History:        Patient has prior history of Echocardiogram examinations, most                 recent 09/20/2021. COPD, Signs/Symptoms:Shortness of Breath; Risk                 Factors:Hypertension, Diabetes, Current Smoker and Dyslipidemia.  Sonographer:    Lucendia Herrlich RCS Referring Phys: [id number] JUSTIN B HOWERTER IMPRESSIONS  1. Left ventricular ejection fraction, by estimation, is 65 to 70%. The left ventricle has normal function. The left ventricle has no regional wall motion abnormalities. There is mild concentric left ventricular  hypertrophy. Left ventricular diastolic parameters are consistent with Grade I diastolic dysfunction (impaired relaxation).  2. Right ventricular systolic function is normal. The right ventricular size is normal.  3. The mitral valve is normal in structure. No evidence of mitral valve regurgitation. No evidence of mitral stenosis.  4. The aortic valve is normal in structure. Aortic valve regurgitation is not visualized. No aortic stenosis is present.  5. The inferior vena cava is normal in size with greater than 50% respiratory variability, suggesting right atrial pressure of 3 mmHg. FINDINGS  Left Ventricle: Left ventricular ejection fraction, by estimation, is 65 to 70%. The left ventricle has normal function. The left ventricle has no regional wall motion abnormalities. Strain imaging was not performed. The left ventricular internal cavity  size was normal in size. There is mild concentric left ventricular hypertrophy. Left ventricular diastolic parameters are consistent with Grade I diastolic dysfunction (impaired relaxation). Right Ventricle: The right ventricular size is normal. No increase in right ventricular wall thickness. Right ventricular systolic function is normal. Left Atrium: Left atrial size was normal in size. Right Atrium: Right atrial size was normal in size. Pericardium: There is no evidence of pericardial effusion. Mitral Valve: The mitral valve is normal in structure. No evidence  of mitral valve regurgitation. No evidence of mitral valve stenosis. Tricuspid Valve: The tricuspid valve is normal in structure. Tricuspid valve regurgitation is trivial. No evidence of tricuspid stenosis. Aortic Valve: The aortic valve is normal in structure. Aortic valve regurgitation is not visualized. No aortic stenosis is present. Aortic valve peak gradient measures 11.0 mmHg. Pulmonic Valve: The pulmonic valve was normal in structure. Pulmonic valve regurgitation is trivial. No evidence of pulmonic stenosis.  Aorta: The aortic root is normal in size and structure. Venous: The inferior vena cava is normal in size with greater than 50% respiratory variability, suggesting right atrial pressure of 3 mmHg. IAS/Shunts: No atrial level shunt detected by color flow Doppler. Additional Comments: 3D imaging was not performed.  LEFT VENTRICLE PLAX 2D LVIDd:         5.20 cm   Diastology LVIDs:         3.00 cm   LV e' medial:    6.42 cm/s LV PW:         1.10 cm   LV E/e' medial:  12.2 LV IVS:        1.00 cm   LV e' lateral:   7.62 cm/s LVOT diam:     2.20 cm   LV E/e' lateral: 10.3 LV SV:         67 LV SV Index:   29 LVOT Area:     3.80 cm  RIGHT VENTRICLE             IVC RV S prime:     17.40 cm/s  IVC diam: 1.90 cm TAPSE (M-mode): 2.5 cm LEFT ATRIUM             Index        RIGHT ATRIUM           Index LA diam:        3.50 cm 1.51 cm/m   RA Area:     21.60 cm LA Vol (A2C):   51.7 ml 22.36 ml/m  RA Volume:   61.10 ml  26.42 ml/m LA Vol (A4C):   50.5 ml 21.84 ml/m LA Biplane Vol: 53.3 ml 23.05 ml/m  AORTIC VALVE AV Area (Vmax): 2.61 cm AV Vmax:        166.00 cm/s AV Peak Grad:   11.0 mmHg LVOT Vmax:      114.00 cm/s LVOT Vmean:     74.500 cm/s LVOT VTI:       0.176 m  AORTA Ao Root diam: 3.70 cm Ao Asc diam:  3.60 cm MITRAL VALVE MV Area (PHT): 5.54 cm     SHUNTS MV Decel Time: 137 msec     Systemic VTI:  0.18 m MV E velocity: 78.40 cm/s   Systemic Diam: 2.20 cm MV A velocity: 111.00 cm/s MV E/A ratio:  0.71 Arvilla Meres MD Electronically signed by Arvilla Meres MD Signature Date/Time: 07/03/2023/5:40:02 PM    Final     Scheduled Meds:  feeding supplement (OSMOLITE 1.5 CAL)  474 mL Per Tube QID   feeding supplement (PROSource TF20)  60 mL Per Tube Daily   free water  360 mL Per Tube TID   glycopyrrolate  1 mg Per Tube QODAY   guaiFENesin  5 mL Per Tube Q6H   insulin aspart  0-6 Units Subcutaneous Q6H   ipratropium  0.5 mg Nebulization BID   levalbuterol  1.25 mg Nebulization BID   methylPREDNISolone  (SOLU-MEDROL) injection  80 mg Intravenous BID   pantoprazole (PROTONIX) IV  40 mg Intravenous Q12H   Continuous Infusions:  piperacillin-tazobactam (ZOSYN)  IV 3.375 g (07/04/23 1335)     LOS: 2 days   35 minutes with more than 50% spent in reviewing records, counseling patient/family and coordinating care.  Tobey Grim, MD [facility] [email or web address] 07/04/2023, 3:14 PM

## 2023-07-04 NOTE — Plan of Care (Signed)
   Problem: Education: Goal: Ability to describe self-care measures that may prevent or decrease complications (Diabetes Survival Skills Education) will improve Outcome: Progressing   Problem: Coping: Goal: Ability to adjust to condition or change in health will improve Outcome: Progressing   Problem: Fluid Volume: Goal: Ability to maintain a balanced intake and output will improve Outcome: Progressing

## 2023-07-04 NOTE — Progress Notes (Signed)
 Initial Nutrition Assessment  DOCUMENTATION CODES:   Non-severe (moderate) malnutrition in context of chronic illness  INTERVENTION:  - TF regimen via G-tube as below: 2 cartons of Osmolite 1.5 @ 0600, 1200, 1800, 2330 (8 cartons/day) Prosource TF20 60 ml daily (will not need on discharge) + 30mL before and after each bolus + FWF TID Provides 2924 kcal, 139 gm protein, 2764 ml free water daily  - Monitor magnesium, potassium, and phosphorus BID for 3 days, MD to replete as needed.  - Monitor weight trends.   - For discharge, would recommend switching back to formula Nutren 1.5 (continue 8 cartons/day)  - Patient to follow up with outpatient cancer RD after discharge.   NUTRITION DIAGNOSIS:   Moderate Malnutrition related to chronic illness (metastatic laryngeal SCC stage IV) as evidenced by mild muscle depletion, percent weight loss (29% weight loss in 1 year).  GOAL:   Patient will meet greater than or equal to 90% of their needs  MONITOR:   Labs, Weight trends, TF tolerance  REASON FOR ASSESSMENT:   Consult Assessment of nutrition requirement/status, Enteral/tube feeding initiation and management  ASSESSMENT:   68 y.o. male with PMH significant for metastatic laryngeal SCC stage IV s/p G-tube placement in February 2025, COPD, B-cell lymphoma, cocaine abuse admitted with acute hypoxic respiratory failure in the setting of acute COPD exacerbation.  Patient known to this RD from recent admission when G-tube was placed.  Patient sitting in bed at time of visit. He reports he may have lost a few more pounds since recent discharge due to slowly advancing to goal TF regimen. His weight has varied from 220-242# this admission. However, patient states the weight this morning of 230# was a standing weight so more accurate.  Over the past 1 year, patient has lost from 324# to 230#. This is a 94# or 29% weight loss in 1 year, which is significant and severe for the time  frame.   He reports being no longer able to take anything by mouth due to ongoing progressive dysphagia.   Thankfully, was able to get to and tolerate goal bolus TF regimen as recommended to him by the outpatient oncology RD.  2 cartons 4x/day (8 cartons/day) of Nutren 1.5 + 30mL before and after each bolus +358mL FWF TID This provides 3000 kcals, 136g protein, and free water  Patient endorses tolerating well and that the regimen works well. Typically provides his first bolus at 6am and then switches back and forth between [facility] and boluses, about every 3 hours.   He was ordered Ensure Plus as TF on admission. No Nutren 1.5 available but will switch to Osmolite 1.5 which is equivalent.   Plan for Osmolite 1.5 8 cartons per day with boluses Q6H. Patient is agreeable to get his last carton around midnight as he reports being up around that time anyway.    Medications reviewed and include: FWF TID  Labs reviewed:  Na 134 Creatinine 1.26   NUTRITION - FOCUSED PHYSICAL EXAM:  Flowsheet Row Most Recent Value  Orbital Region No depletion  Upper Arm Region Mild depletion  Thoracic and Lumbar Region No depletion  Buccal Region No depletion  Temple Region Moderate depletion  Clavicle Bone Region No depletion  Clavicle and Acromion Bone Region Mild depletion  Scapular Bone Region Unable to assess  Dorsal Hand No depletion  Patellar Region No depletion  Anterior Thigh Region No depletion  Posterior Calf Region No depletion  Edema (RD Assessment) None  Hair  Reviewed  Eyes Reviewed  Mouth Reviewed  Skin Reviewed  Nails Reviewed       Diet Order:   Diet Order             Diet NPO time specified  Diet effective now                   EDUCATION NEEDS:  Education needs have been addressed  Skin:  Skin Assessment: Reviewed RN Assessment  Last BM:  unknown  Height:  Ht Readings from Last 1 Encounters:  07/03/23 6' (1.829 m)   Weight:  Wt Readings from  Last 1 Encounters:  07/04/23 104.5 kg   Ideal Body Weight:  80.91 kg  BMI:  Body mass index is 31.25 kg/m.  Estimated Nutritional Needs:  Kcal:  2800-3150 kcals Protein:  125-145 grams Fluid:  >/= 2.8L    Shelle Iron RD, LDN Contact via Secure Chat.

## 2023-07-04 NOTE — Progress Notes (Signed)
 Patient is okay with Peripheral IV, possible D/C tomorrow.

## 2023-07-05 ENCOUNTER — Encounter: Payer: Self-pay | Admitting: Hematology

## 2023-07-05 ENCOUNTER — Other Ambulatory Visit: Payer: Self-pay

## 2023-07-05 ENCOUNTER — Other Ambulatory Visit (HOSPITAL_COMMUNITY): Payer: Self-pay

## 2023-07-05 DIAGNOSIS — J441 Chronic obstructive pulmonary disease with (acute) exacerbation: Secondary | ICD-10-CM | POA: Diagnosis not present

## 2023-07-05 DIAGNOSIS — J9601 Acute respiratory failure with hypoxia: Secondary | ICD-10-CM | POA: Diagnosis not present

## 2023-07-05 DIAGNOSIS — C329 Malignant neoplasm of larynx, unspecified: Secondary | ICD-10-CM

## 2023-07-05 DIAGNOSIS — N179 Acute kidney failure, unspecified: Secondary | ICD-10-CM | POA: Diagnosis not present

## 2023-07-05 DIAGNOSIS — D638 Anemia in other chronic diseases classified elsewhere: Secondary | ICD-10-CM | POA: Diagnosis not present

## 2023-07-05 DIAGNOSIS — E440 Moderate protein-calorie malnutrition: Secondary | ICD-10-CM | POA: Insufficient documentation

## 2023-07-05 LAB — CBC
HCT: 26.3 % — ABNORMAL LOW (ref 39.0–52.0)
Hemoglobin: 7.9 g/dL — ABNORMAL LOW (ref 13.0–17.0)
MCH: 24.6 pg — ABNORMAL LOW (ref 26.0–34.0)
MCHC: 30 g/dL (ref 30.0–36.0)
MCV: 81.9 fL (ref 80.0–100.0)
Platelets: 579 10*3/uL — ABNORMAL HIGH (ref 150–400)
RBC: 3.21 MIL/uL — ABNORMAL LOW (ref 4.22–5.81)
RDW: 14.9 % (ref 11.5–15.5)
WBC: 12 10*3/uL — ABNORMAL HIGH (ref 4.0–10.5)
nRBC: 0 % (ref 0.0–0.2)

## 2023-07-05 LAB — BASIC METABOLIC PANEL
Anion gap: 11 (ref 5–15)
BUN: 50 mg/dL — ABNORMAL HIGH (ref 8–23)
CO2: 33 mmol/L — ABNORMAL HIGH (ref 22–32)
Calcium: 9.4 mg/dL (ref 8.9–10.3)
Chloride: 91 mmol/L — ABNORMAL LOW (ref 98–111)
Creatinine, Ser: 1.29 mg/dL — ABNORMAL HIGH (ref 0.61–1.24)
GFR, Estimated: >60 mL/min (ref >60)
Glucose, Bld: 259 mg/dL — ABNORMAL HIGH (ref 70–99)
Potassium: 4.6 mmol/L (ref 3.5–5.1)
Sodium: 135 mmol/L (ref 135–145)

## 2023-07-05 LAB — GLUCOSE, CAPILLARY
Glucose-Capillary: 236 mg/dL — ABNORMAL HIGH (ref 70–99)
Glucose-Capillary: 332 mg/dL — ABNORMAL HIGH (ref 70–99)
Glucose-Capillary: 386 mg/dL — ABNORMAL HIGH (ref 70–99)

## 2023-07-05 LAB — PHOSPHORUS: Phosphorus: 4 mg/dL (ref 2.5–4.6)

## 2023-07-05 LAB — MAGNESIUM: Magnesium: 2.6 mg/dL — ABNORMAL HIGH (ref 1.7–2.4)

## 2023-07-05 MED ORDER — ALBUTEROL SULFATE HFA 108 (90 BASE) MCG/ACT IN AERS
2.0000 | INHALATION_SPRAY | Freq: Four times a day (QID) | RESPIRATORY_TRACT | 2 refills | Status: DC | PRN
  Filled 2023-07-05: qty 6.7, 30d supply, fill #0
  Filled 2023-07-10: qty 6.7, 30d supply, fill #1

## 2023-07-05 MED ORDER — PREDNISONE 20 MG PO TABS
40.0000 mg | ORAL_TABLET | Freq: Every day | ORAL | 0 refills | Status: AC
  Filled 2023-07-05: qty 6, 3d supply, fill #0

## 2023-07-05 MED ORDER — PROSOURCE TF20 ENFIT COMPATIBL EN LIQD
60.0000 mL | Freq: Every day | ENTERAL | 2 refills | Status: DC
  Filled 2023-07-05 – 2023-07-06 (×2): qty 1800, 30d supply, fill #0

## 2023-07-05 MED ORDER — PANTOPRAZOLE SODIUM 40 MG PO TBEC
40.0000 mg | DELAYED_RELEASE_TABLET | Freq: Every day | ORAL | 0 refills | Status: DC
  Filled 2023-07-05: qty 30, 30d supply, fill #0

## 2023-07-05 NOTE — Progress Notes (Signed)
 Patient able to ambulate independently in hallway on room air, maintaining O2 sats at 96%.

## 2023-07-05 NOTE — Discharge Summary (Signed)
 Physician Discharge Summary   Patient: Jesus [name] MRN: [id number] DOB: [birth year]  Admit date:     07/02/2023  Discharge date: 07/05/23  Discharge Physician: Marcelino Duster   PCP: Noberto Retort, MD   Recommendations at discharge:  {Tip this will not be part of the note when signed- Example include specific recommendations for outpatient follow-up, pending tests to follow-up on. (Optional):26781}  ***  Discharge Diagnoses: Principal Problem:   Acute respiratory failure with hypoxia (HCC) Active Problems:   Anemia of chronic disease   AKI (acute kidney injury) (HCC)   Laryngeal cancer (HCC)   COPD with acute exacerbation (HCC)   SOB (shortness of breath)   Elevated troponin   SIRS (systemic inflammatory response syndrome) (HCC)   Leukocytosis   History of cocaine abuse (HCC)   Malnutrition of moderate degree  Resolved Problems:   * No resolved hospital problems. [facility] Course: No notes on file  Assessment and Plan: No notes have been filed under this hospital service. Service: Hospitalist     {Tip this will not be part of the note when signed Body mass index is 30.32 kg/m. ,  Nutrition Documentation    Flowsheet Row ED to Hosp-Admission (Current) from 07/02/2023 in [place] 4TH FLOOR PROGRESSIVE CARE AND UROLOGY  Nutrition Problem Moderate Malnutrition  Etiology chronic illness  [metastatic laryngeal SCC stage IV]  Nutrition Goal Patient will meet greater than or equal to 90% of their needs  Interventions Refer to RD note for recommendations, Tube feeding     ,  (Optional):26781}  {(NOTE) Pain control PDMP Statment (Optional):26782} Consultants: *** Procedures performed: ***  Disposition: {Plan; Disposition:26390} Diet recommendation:  Discharge Diet Orders (From admission, onward)     Start     Ordered   07/05/23 0000  Diet - low sodium heart healthy        07/05/23 1126   07/05/23 0000  Diet Carb Modified        07/05/23 1126            {Diet_Plan:26776} DISCHARGE MEDICATION: Allergies as of 07/05/2023       Reactions   Bee Venom Anaphylaxis   Lisinopril Other (See Comments)   Swelling in face   Antifungal [miconazole Nitrate] Other (See Comments)    made his head hurt   Zolpidem Tartrate Er Other (See Comments)   Leg cramps        Medication List     TAKE these medications    acetaminophen 160 MG/5ML solution Commonly known as: TYLENOL Place 20.3 mLs (650 mg total) into feeding tube every 6 (six) hours as needed for mild pain (pain score 1-3).   albuterol 108 (90 Base) MCG/ACT inhaler Commonly known as: VENTOLIN HFA Inhale 2 puffs into the lungs every 6 (six) hours as needed for wheezing or shortness of breath.   feeding supplement (PROSource TF20) liquid Place 60 mLs into feeding tube daily. Start taking on: July 06, 2023   free water Soln Place 125 mLs into feeding tube every 4 (four) hours. What changed:  how much to take additional instructions   glycopyrrolate 1 MG tablet Commonly known as: ROBINUL Take 1 tablet (1 mg total) by mouth every 4 (four) hours as needed (excessive secretions).   lidocaine-prilocaine cream Commonly known as: EMLA Apply to affected area once   Nutren 1.5 Liqd Give 2 cartons 4 times a day.  Flush with 30ml of water before and after each feeding.  Give additional of water TID  for additional hydration.   oxyCODONE 5 MG/5ML solution Commonly known as: ROXICODONE Place 5 mLs (5 mg total) into feeding tube every 6 (six) hours as needed for breakthrough pain.   pantoprazole 40 MG tablet Commonly known as: Protonix Take 1 tablet (40 mg total) by mouth daily.   predniSONE 20 MG tablet Commonly known as: DELTASONE Place 2 tablets (40 mg total) into feeding tube daily before breakfast for 3 days. Start taking on: July 06, 2023        Discharge Exam: Ceasar Mons Weights   07/03/23 2100 07/04/23 0440 07/05/23 0500  Weight: 99.9 kg 104.5 kg  101.4 kg   ***  Condition at discharge: {DC Condition:26389}  The results of significant diagnostics from this hospitalization (including imaging, microbiology, ancillary and laboratory) are listed below for reference.   Imaging Studies: ECHOCARDIOGRAM COMPLETE Result Date: 07/03/2023    ECHOCARDIOGRAM REPORT   Patient Name:   Jesus [name] Date of Exam: 07/03/2023 Medical Rec #:  [id number]      Height:       72.0 in Accession #:    [id number]     Weight:       242.5 lb Date of Birth:  02/11/1956     BSA:          2.312 m Patient Age:    68 years       BP:           117/69 mmHg Patient Gender: M              HR:           96 bpm. Exam Location:  [facility] Procedure: 2D Echo, Cardiac Doppler and Color Doppler (Both Spectral and Color            Flow Doppler were utilized during procedure). Indications:    Elevated Troponin  History:        Patient has prior history of Echocardiogram examinations, most                 recent 09/20/2021. COPD, Signs/Symptoms:Shortness of Breath; Risk                 Factors:Hypertension, Diabetes, Current Smoker and Dyslipidemia.  Sonographer:    Lucendia Herrlich RCS Referring Phys: [id number] JUSTIN B HOWERTER IMPRESSIONS  1. Left ventricular ejection fraction, by estimation, is 65 to 70%. The left ventricle has normal function. The left ventricle has no regional wall motion abnormalities. There is mild concentric left ventricular hypertrophy. Left ventricular diastolic parameters are consistent with Grade I diastolic dysfunction (impaired relaxation).  2. Right ventricular systolic function is normal. The right ventricular size is normal.  3. The mitral valve is normal in structure. No evidence of mitral valve regurgitation. No evidence of mitral stenosis.  4. The aortic valve is normal in structure. Aortic valve regurgitation is not visualized. No aortic stenosis is present.  5. The inferior vena cava is normal in size with greater than 50% respiratory variability,  suggesting right atrial pressure of 3 mmHg. FINDINGS  Left Ventricle: Left ventricular ejection fraction, by estimation, is 65 to 70%. The left ventricle has normal function. The left ventricle has no regional wall motion abnormalities. Strain imaging was not performed. The left ventricular internal cavity  size was normal in size. There is mild concentric left ventricular hypertrophy. Left ventricular diastolic parameters are consistent with Grade I diastolic dysfunction (impaired relaxation). Right Ventricle: The right ventricular size is normal. No increase in right ventricular wall  thickness. Right ventricular systolic function is normal. Left Atrium: Left atrial size was normal in size. Right Atrium: Right atrial size was normal in size. Pericardium: There is no evidence of pericardial effusion. Mitral Valve: The mitral valve is normal in structure. No evidence of mitral valve regurgitation. No evidence of mitral valve stenosis. Tricuspid Valve: The tricuspid valve is normal in structure. Tricuspid valve regurgitation is trivial. No evidence of tricuspid stenosis. Aortic Valve: The aortic valve is normal in structure. Aortic valve regurgitation is not visualized. No aortic stenosis is present. Aortic valve peak gradient measures 11.0 mmHg. Pulmonic Valve: The pulmonic valve was normal in structure. Pulmonic valve regurgitation is trivial. No evidence of pulmonic stenosis. Aorta: The aortic root is normal in size and structure. Venous: The inferior vena cava is normal in size with greater than 50% respiratory variability, suggesting right atrial pressure of 3 mmHg. IAS/Shunts: No atrial level shunt detected by color flow Doppler. Additional Comments: 3D imaging was not performed.  LEFT VENTRICLE PLAX 2D LVIDd:         5.20 cm   Diastology LVIDs:         3.00 cm   LV e' medial:    6.42 cm/s LV PW:         1.10 cm   LV E/e' medial:  12.2 LV IVS:        1.00 cm   LV e' lateral:   7.62 cm/s LVOT diam:     2.20 cm    LV E/e' lateral: 10.3 LV SV:         67 LV SV Index:   29 LVOT Area:     3.80 cm  RIGHT VENTRICLE             IVC RV S prime:     17.40 cm/s  IVC diam: 1.90 cm TAPSE (M-mode): 2.5 cm LEFT ATRIUM             Index        RIGHT ATRIUM           Index LA diam:        3.50 cm 1.51 cm/m   RA Area:     21.60 cm LA Vol (A2C):   51.7 ml 22.36 ml/m  RA Volume:   61.10 ml  26.42 ml/m LA Vol (A4C):   50.5 ml 21.84 ml/m LA Biplane Vol: 53.3 ml 23.05 ml/m  AORTIC VALVE AV Area (Vmax): 2.61 cm AV Vmax:        166.00 cm/s AV Peak Grad:   11.0 mmHg LVOT Vmax:      114.00 cm/s LVOT Vmean:     74.500 cm/s LVOT VTI:       0.176 m  AORTA Ao Root diam: 3.70 cm Ao Asc diam:  3.60 cm MITRAL VALVE MV Area (PHT): 5.54 cm     SHUNTS MV Decel Time: 137 msec     Systemic VTI:  0.18 m MV E velocity: 78.40 cm/s   Systemic Diam: 2.20 cm MV A velocity: 111.00 cm/s MV E/A ratio:  0.71 Arvilla Meres MD Electronically signed by Arvilla Meres MD Signature Date/Time: 07/03/2023/5:40:02 PM    Final    CT Angio Chest PE W/Cm &/Or Wo Cm Result Date: 07/02/2023 CLINICAL DATA:  Pulmonary embolism (PE) suspected, high prob. Shortness of breath EXAM: CT ANGIOGRAPHY CHEST WITH CONTRAST TECHNIQUE: Multidetector CT imaging of the chest was performed using the standard protocol during bolus administration of intravenous contrast. Multiplanar CT image reconstructions and  MIPs were obtained to evaluate the vascular anatomy. RADIATION DOSE REDUCTION: This exam was performed according to the departmental dose-optimization program which includes automated exposure control, adjustment of the mA and/or kV according to patient size and/or use of iterative reconstruction technique. CONTRAST:  75mL OMNIPAQUE IOHEXOL 350 MG/ML SOLN COMPARISON:  06/15/2023 FINDINGS: Cardiovascular: No filling defects in the pulmonary arteries to suggest pulmonary emboli. Heart is normal size. Aorta is normal caliber. Mediastinum/Nodes: Abnormal soft tissue at the thoracic  inlet and extending into the upper mediastinum surrounding the trachea and esophagus corresponding to known laryngeal cancer. This narrows the trachea at the level of the thoracic inlet. Findings are similar to prior study. No adenopathy. Lungs/Pleura: Lungs are clear. No focal airspace opacities or suspicious nodules. No effusions. Upper Abdomen: No acute findings Musculoskeletal: Right upper chest wall Port-A-Cath remains in place. No acute bony abnormality. Review of the MIP images confirms the above findings. IMPRESSION: No evidence of pulmonary embolus. Abnormal soft tissue at the thoracic inlet and upper mediastinum surrounding the trachea and esophagus compatible with patient's known cancer. This narrows the tracheal lumen significantly at the level of the thoracic inlet. Electronically Signed   By: Charlett Nose M.D.   On: 07/02/2023 21:06   DG Chest Port 1 View Result Date: 07/02/2023 CLINICAL DATA:  Shortness of breath EXAM: PORTABLE CHEST 1 VIEW COMPARISON:  06/15/2023 FINDINGS: Lungs are clear.  No pleural effusion or pneumothorax. The heart is normal in size. Right chest power port terminates in the lower SVC. IMPRESSION: No acute cardiopulmonary disease. Electronically Signed   By: Charline Bills M.D.   On: 07/02/2023 20:10   VAS [place] ABI WITH/WO TBI Result Date: 06/28/2023  LOWER EXTREMITY DOPPLER STUDY Patient Name:  Jesus [name]  Date of Exam:   06/26/2023 Medical Rec #: [id number]       Accession #:    [id number] Date of Birth: 05-08-1956      Patient Gender: M Patient Age:   89 years Exam Location:  [facility] Procedure:      VAS [place] ABI WITH/WO TBI Referring Phys: --------------------------------------------------------------------------------  Indications: Patient complians of numbness in both feet. He also has restless              leg syndrome which is worse at night. He denies claudication and              rest pain symptoms. He is not able to walk for long periods due to               SOB. High Risk         Hypertension, hyperlipidemia, Diabetes, past history of Factors:          smoking.  Comparison Study: Prior ABI 04/29/2014 right 1.13 left 1.42 Performing Technologist: Carlos American RVT  Examination Guidelines: A complete evaluation includes at minimum, Doppler waveform signals and systolic blood pressure reading at the level of bilateral brachial, anterior tibial, and posterior tibial arteries, when vessel segments are accessible. Bilateral testing is considered an integral part of a complete examination. Photoelectric Plethysmograph (PPG) waveforms and toe systolic pressure readings are included as required and additional duplex testing as needed. Limited examinations for reoccurring indications may be performed as noted.  ABI Findings: +---------+------------------+-----+---------+--------+ Right    Rt Pressure (mmHg)IndexWaveform Comment  +---------+------------------+-----+---------+--------+ Brachial 130                                      +---------+------------------+-----+---------+--------+  PTA      166               1.28 triphasic         +---------+------------------+-----+---------+--------+ DP       152               1.17 triphasic         +---------+------------------+-----+---------+--------+ Great Toe112               0.86 Normal            +---------+------------------+-----+---------+--------+ +---------+------------------+-----+---------+-------+ Left     Lt Pressure (mmHg)IndexWaveform Comment +---------+------------------+-----+---------+-------+ Brachial 129                                     +---------+------------------+-----+---------+-------+ PTA      166               1.28 triphasic        +---------+------------------+-----+---------+-------+ DP       179               1.38 triphasic        +---------+------------------+-----+---------+-------+ Great Toe156               1.20 Normal            +---------+------------------+-----+---------+-------+ +-------+-----------+-----------+------------+------------+ ABI/TBIToday's ABIToday's TBIPrevious ABIPrevious TBI +-------+-----------+-----------+------------+------------+ Right  1.26       .86        1.13                     +-------+-----------+-----------+------------+------------+ Left   1.38       1.2        1.42                     +-------+-----------+-----------+------------+------------+  Bilateral ABIs appear essentially unchanged compared to prior study on 04/29/2014.  Summary: Right: Resting right ankle-brachial index is within normal range. The right toe-brachial index is normal. Left: Resting left ankle-brachial index indicates noncompressible left lower extremity arteries. The left toe-brachial index is normal. *See table(s) above for measurements and observations.  Electronically signed by Lorine Bears MD on 06/28/2023 at 1:40:58 PM.    Final    DG SWALLOW FUNC OP MEDICARE SPEECH PATH Addendum Date: 06/27/2023 ADDENDUM REPORT: 06/27/2023 09:46 ADDENDUM: Multiple cine sequences were obtained. Electronically Signed   By: Lupita Raider M.D.   On: 06/27/2023 09:46   Result Date: 06/27/2023 CLINICAL DATA:  Dysphagia. Cough/GE reflux disease/other secondary diagnosis EXAM: MODIFIED BARIUM SWALLOW TECHNIQUE: Different consistencies of barium were administered orally to the patient by the Speech Pathologist. Imaging of the pharynx was performed in the lateral projection. The radiologist was present in the fluoroscopy room for this study, providing personal supervision. FLUOROSCOPY TIME:  Radiation Exposure Index (as provided by the fluoroscopic device): 5.9 mGy Kerma COMPARISON:  None Available. FINDINGS: Fluoroscopic guidance was provided while modified barium swallow was performed by the speech pathologist. Please refer to the Speech Pathology report for results and recommendations. IMPRESSION: Please refer to the Speech  Pathologists report for complete details and recommendations. Electronically Signed: By: Lupita Raider M.D. On: 06/06/2023 15:24   IR REPLACE G-TUBE SIMPLE WO FLUORO Result Date: 06/22/2023 INDICATION: 69 year old male with surgically placed gastrostomy tube 48 hours ago. This has been damaged and displaced. The patient presents for attempted rescue/replacement. EXAM: IR TUBE PLACEMENT/REPLACEMENT/CHECK DISCONTINUATION OF GASTROSTOMY PLACEMENT  MEDICATIONS: None ANESTHESIA/SEDATION: None CONTRAST:  10mL OMNIPAQUE IOHEXOL 300 MG/ML SOLN - administered into the gastric lumen. FLUOROSCOPY: Radiation Exposure Index (as provided by the fluoroscopic device): 100 mGy Kerma COMPLICATIONS: None PROCEDURE: Informed written consent was obtained from the patient and the patient's family after a thorough discussion of the procedural risks, benefits and alternatives. All questions were addressed. Maximal Sterile Barrier Technique was utilized including caps, mask, sterile gowns, sterile gloves, sterile drape, hand hygiene and skin antiseptic. A timeout was performed prior to the initiation of the procedure. The epigastrium was prepped with Betadine in a sterile fashion, and a sterile drape was applied covering the operative field. A sterile gown and sterile gloves were used for the procedure. Under fluoroscopic guidance, contrast was injected through the surgical site representing the ostomy of the prior gastrostomy. Contrast injected into the subcutaneous tissues, with no obvious tract into the stomach lumen. A Kumpe the catheter was then advanced through the surgical site into the soft tissues, with contrast injected attempting to identify the tract into the stomach. AP and oblique images were used in the attempt to find the possible tract. Ultimately we were unable to find a viable tract through the abdominal wall into the stomach lumen, with only excavation of the superficial subcutaneous tissues with contrast. We  withdrew from the attempt with no tract identified. Patient tolerated the procedure well and remained hemodynamically stable throughout. No complications were encountered and no significant blood loss encountered. IMPRESSION: Status post failed attempt of rescue/placement of damaged and displaced surgical gastrostomy. Signed, Yvone Neu. Loreta Ave, DO [facility] Electronically Signed   By: Gilmer Mor D.O.   On: 06/22/2023 16:16   CT Soft Tissue Neck W Contrast Result Date: 06/15/2023 CLINICAL DATA:  Shortness of breath with hemoptysis EXAM: CT NECK WITH CONTRAST TECHNIQUE: Multidetector CT imaging of the neck was performed using the standard protocol following the bolus administration of intravenous contrast. RADIATION DOSE REDUCTION: This exam was performed according to the departmental dose-optimization program which includes automated exposure control, adjustment of the mA and/or kV according to patient size and/or use of iterative reconstruction technique. CONTRAST:  75mL OMNIPAQUE IOHEXOL 350 MG/ML SOLN COMPARISON:  05/05/2022 CT neck 04/14/2023 PET CT FINDINGS: Pharynx and larynx: At the level of the lower glottis and inferior aspect of the thyroid gland, there is circumferential soft tissue abnormality surrounding the trachea and posteriorly displacing the esophagus. This masslike region measures approximately 4.0 x 4.2 x 5.2 cm. The mass appears more infiltrative of the surrounding tissues with somewhat indistinct margins suggestive of inflammation. Salivary glands: No inflammation, mass, or stone. Thyroid: Above described soft tissue abnormality at the level of the thoracic inlet is continuous with the lower left thyroid lobe. This causes marked rightward deviation of the trachea, which remains patent. Lymph nodes: Previously visualized enlarged lymph nodes of the right cervical chain have resolved. Unchanged left level 3 node measuring 6 mm (2:67).  No enlarged or abnormal density nodes. Vascular: Right IJ approach central venous catheter with tip below the field of view (Port-A-Cath), with access needle. Limited intracranial: Normal Visualized orbits: Normal Mastoids and visualized paranasal sinuses: Clear. Skeleton: No acute or aggressive process. Upper chest: Negative. Other: None. IMPRESSION: 1. Circumferential soft tissue abnormality surrounding the trachea and posteriorly displacing the esophagus at the level of the thoracic inlet, worsened since 04/14/2023 and consistent with progression of glottic squamous cell carcinoma. 2. Area inflammatory change associated with the above mass is likely a post radiation  effect. 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 3. Previously visualized enlarged lymph nodes of the right cervical chain have resolved. Electronically Signed   By: Deatra Robinson M.D.   On: 06/15/2023 23:01   CT Angio Chest PE W and/or Wo Contrast Result Date: 06/15/2023 CLINICAL DATA:  Pulmonary embolus suspected with high probability. Shortness of breath, tachycardia, productive cough with hemoptysis. Cancer. Difficulty swallowing. Known throat cancer. EXAM: CT ANGIOGRAPHY CHEST WITH CONTRAST TECHNIQUE: Multidetector CT imaging of the chest was performed using the standard protocol during bolus administration of intravenous contrast. Multiplanar CT image reconstructions and MIPs were obtained to evaluate the vascular anatomy. RADIATION DOSE REDUCTION: This exam was performed according to the departmental dose-optimization program which includes automated exposure control, adjustment of the mA and/or kV according to patient size and/or use of iterative reconstruction technique. CONTRAST:  75mL OMNIPAQUE IOHEXOL 350 MG/ML SOLN COMPARISON:  Chest radiograph 06/15/2023. PET-CT 04/14/2023. CT chest 05/05/2022 FINDINGS: Cardiovascular: Technically adequate study with good opacification of the central and segmental  pulmonary arteries. Mild motion artifact. No focal filling defects. No evidence of significant pulmonary embolus. Normal heart size. No pericardial effusions. Normal caliber thoracic aorta. No aortic dissection. Great vessel origins are patent. Mediastinum/Nodes: Right central venous catheter with tip at the cavoatrial junction. Eccentric esophageal or paraesophageal mass at the thoracic inlet measuring about 3.6 cm diameter. This displaces the trachea towards the right. This is consistent with known esophageal neoplasm. The lateral wall of the trachea is thickened and irregular, likely indicating direct invasion of the trachea. The distal esophagus is decompressed. Mildly enlarged mediastinal lymph nodes with pretracheal nodes measuring up to 10 mm short axis dimension. These are nonspecific. Lungs/Pleura: Patchy perihilar infiltrates could be due to motion artifact or may indicate mild edema or pneumonia. Bandlike opacities in the left lung base likely representing scarring. No pleural effusion or pneumothorax. Upper Abdomen: No acute abnormalities. Renal cysts, largest on the left measuring 5.7 cm diameter. No imaging follow-up is indicated. Musculoskeletal: Degenerative changes in the spine. No destructive bone lesions. Review of the MIP images confirms the above findings. IMPRESSION: 1. Esophageal mass of the level of the thoracic inlet, displacing the trachea. This likely corresponds to known cancer. The adjacent tracheal wall is irregular, likely representing direct tumor invasion. Nonspecific mediastinal lymph nodes could be metastatic. 2. No evidence of significant pulmonary embolus. 3. Patchy perihilar infiltrates may represent motion artifact, edema, or pneumonia. Electronically Signed   By: Burman Nieves M.D.   On: 06/15/2023 21:43   DG Chest 2 View Result Date: 06/15/2023 CLINICAL DATA:  Neck pain difficulty swallowing EXAM: CHEST - 2 VIEW COMPARISON:  08/16/2022 FINDINGS: Right-sided central  venous port tip at the SVC. No acute airspace disease or effusion. Normal cardiac size. Rightward tracheal deviation with left paratracheal opacity. IMPRESSION: 1. No active cardiopulmonary disease. 2. Rightward tracheal deviation with left paratracheal opacity,, presumably corresponding to mass seen on prior PET CT. Electronically Signed   By: Jasmine Pang M.D.   On: 06/15/2023 20:12   DG Neck Soft Tissue Result Date: 06/15/2023 CLINICAL DATA:  Neck pain difficulty swallowing EXAM: NECK SOFT TISSUES - 1+ VIEW COMPARISON:  Esophagram 06/09/2023, swallow studies 05/25/2023, 04/14/2023 FINDINGS: Normal prevertebral soft tissue thickness. No retropharyngeal thickening or gas. Slightly thickened appearance of the epiglottis and slightly hazy appearance of the glottic area. There may be slight narrowing of the subglottic trachea. Suspect similar appearance on recent swallowing exams. IMPRESSION: Slightly thickened appearance of the epiglottis and slightly  hazy appearance of the glottic area suggesting mild edema or inflammation. Consider correlation with direct visualization. Possible mild narrowing of the subglottic trachea as well. Electronically Signed   By: Jasmine Pang M.D.   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 Dysphagia. EXAM: ESOPHOGRAM/BARIUM SWALLOW TECHNIQUE: Single contrast examination was performed using thin barium or water soluble. FLUOROSCOPY: Radiation Exposure Index (as provided by the fluoroscopic device): 5.2 mGy Kerma Total fluoroscopy time: 17 seconds. COMPARISON:  Modified speech swallow study 06/06/2023 and 05/25/2023; PET-CT 04/14/2023; CT chest 05/05/2022 FINDINGS: Prior to the start of the procedure, the patient's history was reviewed. Speech pathology report from modified barium swallow study 06/06/2023 stated the patient was  able to eat solid foods without penetration or aspiration. There was penetration without aspiration with honey thick nectar liquids. There was aspiration of thin liquids without reflex for coughing or regurgitation for clearance. Therefore, the current study was initiated with caution. The patient swallowed a sip of thin barium and penetration was seen to the level of the vocal cords. He describes a feeling of mucous in his throat. There was no immediate cough reflex. I asked the patient to dry swallow and try to clear the thin liquid. Eventually the liquid above the vocal cords diminished at subsequent images. On one more sip of thin liquid barium, penetration was again seen with trace aspiration below the vocal cords. The patient's voice sounded wet. Therefore, this esophagram study was terminated. IMPRESSION: Penetration with thin liquid barium to the level of the vocal cords. Trace aspiration below the vocal cords. Therefore, the rest of this esophagram study could not be completed. Electronically Signed   By: Neita Garnet M.D.   On: 06/09/2023 11:43    Microbiology: Results for orders placed or performed during the hospital encounter of 07/02/23  Resp panel by RT-PCR (RSV, Flu A&B, Covid) Anterior Nasal Swab     Status: None   Collection Time: 07/02/23  6:47 PM   Specimen: Anterior Nasal Swab  Result Value Ref Range Status   SARS Coronavirus 2 by RT PCR NEGATIVE NEGATIVE Final   Influenza A by PCR NEGATIVE NEGATIVE Final   Influenza B by PCR NEGATIVE NEGATIVE Final    Comment: (NOTE) The Xpert Xpress SARS-CoV-2/FLU/RSV plus assay is intended as an aid in the diagnosis of influenza from Nasopharyngeal swab specimens and should not be used as a sole basis for treatment. Nasal washings and aspirates are unacceptable for Xpert Xpress SARS-CoV-2/FLU/RSV testing.  Fact Sheet for Patients: BloggerCourse.com  Fact Sheet for Healthcare  Providers: SeriousBroker.it  This test is not yet approved or cleared by the [place] FDA and has been authorized for detection and/or diagnosis of SARS-CoV-2 by FDA under an Emergency Use Authorization (EUA). This EUA will remain in effect (meaning this test can be used) for the duration of the COVID-19 declaration under Section 564(b)(1) of the Act, 21 U.S.C. section 360bbb-3(b)(1), unless the authorization is terminated or revoked.     Resp Syncytial Virus by PCR NEGATIVE NEGATIVE Final    Comment: (NOTE) Fact Sheet for Patients: BloggerCourse.com  Fact Sheet for Healthcare Providers: SeriousBroker.it  This test is not yet approved or cleared by the [place] FDA and has been authorized for detection and/or diagnosis of SARS-CoV-2 by FDA under an Emergency Use Authorization (EUA). This EUA will remain in effect (meaning this test  can be used) for the duration of the COVID-19 declaration under Section 564(b)(1) of the Act, 21 U.S.C. section 360bbb-3(b)(1), unless the authorization is terminated or revoked.  Performed at [facility], [address] [place], [place] [address]   Blood culture (routine x 2)     Status: None (Preliminary result)   Collection Time: 07/02/23  9:26 PM   Specimen: BLOOD  Result Value Ref Range Status   Specimen Description BLOOD LEFT ARM  Final   Special Requests   Final    BOTTLES DRAWN AEROBIC AND ANAEROBIC Blood Culture results may not be optimal due to an inadequate volume of blood received in culture bottles   Culture   Final    NO GROWTH 3 DAYS Performed at [facility], [address], [place], [place] [address]    Report Status PENDING  Incomplete  Blood culture (routine x 2)     Status: None (Preliminary result)   Collection Time: 07/02/23  9:26 PM   Specimen: BLOOD  Result Value Ref Range Status   Specimen Description BLOOD SITE NOT  SPECIFIED  Final   Special Requests   Final    BOTTLES DRAWN AEROBIC AND ANAEROBIC Blood Culture results may not be optimal due to an inadequate volume of blood received in culture bottles   Culture   Final    NO GROWTH 3 DAYS Performed at [facility], [address], [place], [place] [address]    Report Status PENDING  Incomplete    Labs: CBC: Recent Labs  Lab 07/02/23 1847 07/02/23 1901 07/03/23 0506 07/03/23 2056 07/04/23 0354 07/05/23 0417  WBC 20.4*  --  12.1*  --  7.8 12.0*  NEUTROABS 14.7*  --   --   --   --   --   HGB 8.7* 10.2* 7.7* 7.7* 7.3* 7.9*  HCT 29.0* 30.0* 26.1* 25.8* 23.9* 26.3*  MCV 82.4  --  82.9  --  81.6 81.9  PLT 553*  --  492*  --  484* 579*   Basic Metabolic Panel: Recent Labs  Lab 07/02/23 1847 07/02/23 1901 07/02/23 2126 07/03/23 0506 07/04/23 0354 07/04/23 1608 07/05/23 0417  NA 131* 130*  --  131* 134*  --  135  K 4.9 4.9  --  5.4* 4.7  --  4.6  CL 88*  --   --  90* 91*  --  91*  CO2 30  --   --  29 30  --  33*  GLUCOSE 281*  --   --  195* 283*  --  259*  BUN 29*  --   --  31* 44*  --  50*  CREATININE 1.24  --   --  1.34* 1.26*  --  1.29*  CALCIUM 9.5  --   --  9.3 9.5  --  9.4  MG  --   --  2.3 2.3  --  2.7* 2.6*  PHOS  --   --   --  4.7*  --  4.1 4.0   Liver Function Tests: Recent Labs  Lab 07/02/23 1847 07/03/23 0506  AST 30 28  ALT 35 29  ALKPHOS 118 99  BILITOT 0.7 0.8  PROT 8.6* 8.0  ALBUMIN 3.0* 2.8*   CBG: Recent Labs  Lab 07/04/23 1253 07/04/23 1623 07/05/23 0002 07/05/23 0623 07/05/23 0728  GLUCAP 353* 268* 386* 236* 332*    Discharge time spent: 35 minutes.  Signed: Marcelino Duster, MD [facility] 07/05/2023

## 2023-07-05 NOTE — TOC Initial Note (Signed)
 Transition of Care [facility]) - Initial/Assessment Note    Patient Details  Name: Jesus [name] MRN: [id number] Date of Birth: 01-Mar-1956  Transition of [facility]) CM/SW Contact:    Larrie Kass, LCSW Phone Number: 07/05/2023, 10:14 AM  Clinical Narrative:                 Pt is active with Danelle Earthly for [facility] RN. Will need new orders. TOC to follow.   Expected Discharge Plan:  (TBD) Barriers to Discharge: Continued Medical Work up   Patient Goals and CMS Choice            Expected Discharge Plan and Services                                              Prior Living Arrangements/Services                       Activities of Daily Living   ADL Screening (condition at time of admission) Independently performs ADLs?: Yes (appropriate for developmental age) Is the patient deaf or have difficulty hearing?: No Does the patient have difficulty seeing, even when wearing glasses/contacts?: No Does the patient have difficulty concentrating, remembering, or making decisions?: No  Permission Sought/Granted                  Emotional Assessment              Admission diagnosis:  Respiratory distress [R06.03] Acute respiratory failure with hypoxia (HCC) [J96.01] Sepsis with acute hypoxic respiratory failure without septic shock, due to unspecified organism (HCC) [A41.9, R65.20, J96.01] Acute hypoxic respiratory failure (HCC) [J96.01] Patient Active Problem List   Diagnosis Date Noted   Malnutrition of moderate degree 07/05/2023   COPD with acute exacerbation (HCC) 07/03/2023   SOB (shortness of breath) 07/03/2023   Elevated troponin 07/03/2023   SIRS (systemic inflammatory response syndrome) (HCC) 07/03/2023   Leukocytosis 07/03/2023   History of cocaine abuse (HCC) 07/03/2023   Acute respiratory failure with hypoxia (HCC) 07/02/2023   Protein-calorie malnutrition, severe 06/20/2023   Dysphagia 06/16/2023   CAP (community acquired pneumonia)  06/15/2023   Immunocompromised patient (HCC) 06/15/2023   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   Pain due to onychomycosis of toenail 10/25/2022   Port-A-Cath in place 09/05/2022   Head and neck cancer (HCC) 08/04/2022   Chronic pain 07/15/2021   Diabetes mellitus without complication (HCC) 07/15/2021   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   Gastroesophageal reflux disease without esophagitis 07/15/2021   History of lymphoma 07/15/2021   Lipoma of skin and subcutaneous tissue of trunk 07/15/2021   Mixed hyperlipidemia 07/15/2021   Prediabetes 07/15/2021   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   Tobacco dependence 07/15/2021   Trigger thumb of left hand 02/17/2021   Laryngeal cancer (HCC) 10/02/2020   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   Hoarseness 08/11/2020   Chronic pain of right knee 10/17/2018   Large cell (diffuse) non-Hodgkin's lymphoma (HCC) 05/10/2018   Bacteremia due to Escherichia coli    HTN (hypertension) 04/17/2018   RLS (restless legs syndrome) 04/17/2018   Abnormal LFTs 04/17/2018   Acute metabolic encephalopathy 04/16/2018   AKI (acute kidney injury) (HCC) 04/16/2018   Dehydration    Fever, unspecified    Sepsis (HCC)    Disorientation  Non-Hodgkin's lymphoma of skin (HCC) 04/03/2018   Hypokalemia    Hypomagnesemia    Anemia of chronic disease    Encounter for antineoplastic chemotherapy    At high risk of tumor lysis syndrome    Swelling of lower leg    Diffuse large B cell lymphoma (HCC) 01/15/2018   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HCC) 01/12/2018   Counseling regarding advance care planning and goals of care 01/12/2018   Bilateral leg pain 05/27/2014   PCP:  Noberto Retort, MD Pharmacy:   [facility] [address] [place]), [place] - [address] [place]) [place] [address] Phone: [phone] Fax:  [phone]     Social Drivers of Health (SDOH) Social History: SDOH Screenings   Food Insecurity: No Food Insecurity (07/03/2023)  Housing: Low Risk  (07/03/2023)  Transportation Needs: No Transportation Needs (07/03/2023)  Utilities: Not At Risk (07/03/2023)  Depression (PHQ2-9): Low Risk  (11/09/2022)  Financial Resource Strain: Low Risk  (10/06/2020)  Social Connections: Moderately Isolated (07/03/2023)  Tobacco Use: Medium Risk (07/03/2023)   SDOH Interventions:     Readmission Risk Interventions    06/21/2023   10:14 AM  Readmission Risk Prevention Plan  Transportation Screening Complete  PCP or Specialist Appt within 3-5 Days Complete  HRI or Home Care Consult Complete  Social Work Consult for Recovery Care Planning/Counseling Complete  Palliative Care Screening Not Applicable  Medication Review Oceanographer) Complete

## 2023-07-05 NOTE — Progress Notes (Signed)
 Meds delivered form [facility] outpatient pharmacy by this RN.   [facility] unable to be filled because it is not stocked. Script sent over to [facility] on [address].

## 2023-07-06 ENCOUNTER — Other Ambulatory Visit: Payer: Self-pay

## 2023-07-06 ENCOUNTER — Other Ambulatory Visit (HOSPITAL_COMMUNITY): Payer: Self-pay

## 2023-07-06 LAB — HEMOGLOBIN A1C
Hgb A1c MFr Bld: 7.2 % — ABNORMAL HIGH (ref 4.8–5.6)
Mean Plasma Glucose: 160 mg/dL

## 2023-07-07 ENCOUNTER — Other Ambulatory Visit (HOSPITAL_COMMUNITY): Payer: Self-pay

## 2023-07-07 LAB — CULTURE, BLOOD (ROUTINE X 2)
Culture: NO GROWTH
Culture: NO GROWTH

## 2023-07-08 DIAGNOSIS — C329 Malignant neoplasm of larynx, unspecified: Secondary | ICD-10-CM | POA: Diagnosis not present

## 2023-07-10 ENCOUNTER — Encounter (HOSPITAL_COMMUNITY): Payer: PPO

## 2023-07-10 ENCOUNTER — Other Ambulatory Visit (HOSPITAL_COMMUNITY): Payer: Self-pay

## 2023-07-10 ENCOUNTER — Encounter (HOSPITAL_COMMUNITY): Payer: Self-pay

## 2023-07-10 ENCOUNTER — Other Ambulatory Visit (HOSPITAL_BASED_OUTPATIENT_CLINIC_OR_DEPARTMENT_OTHER): Payer: Self-pay

## 2023-07-11 ENCOUNTER — Other Ambulatory Visit: Payer: Self-pay

## 2023-07-11 ENCOUNTER — Emergency Department (HOSPITAL_COMMUNITY)
Admission: EM | Admit: 2023-07-11 | Discharge: 2023-07-11 | Disposition: A | Discharge status: Home / Self Care | Attending: Emergency Medicine | Admitting: Emergency Medicine

## 2023-07-11 ENCOUNTER — Other Ambulatory Visit (HOSPITAL_COMMUNITY): Payer: Self-pay

## 2023-07-11 ENCOUNTER — Telehealth: Payer: Self-pay | Admitting: Hematology

## 2023-07-11 ENCOUNTER — Encounter (HOSPITAL_COMMUNITY): Payer: Self-pay

## 2023-07-11 DIAGNOSIS — R531 Weakness: Secondary | ICD-10-CM | POA: Insufficient documentation

## 2023-07-11 DIAGNOSIS — Z931 Gastrostomy status: Secondary | ICD-10-CM | POA: Insufficient documentation

## 2023-07-11 DIAGNOSIS — I517 Cardiomegaly: Secondary | ICD-10-CM | POA: Insufficient documentation

## 2023-07-11 DIAGNOSIS — Z85828 Personal history of other malignant neoplasm of skin: Secondary | ICD-10-CM | POA: Diagnosis not present

## 2023-07-11 DIAGNOSIS — I4510 Unspecified right bundle-branch block: Secondary | ICD-10-CM | POA: Diagnosis not present

## 2023-07-11 DIAGNOSIS — D72829 Elevated white blood cell count, unspecified: Secondary | ICD-10-CM | POA: Diagnosis not present

## 2023-07-11 DIAGNOSIS — J449 Chronic obstructive pulmonary disease, unspecified: Secondary | ICD-10-CM | POA: Diagnosis not present

## 2023-07-11 DIAGNOSIS — R739 Hyperglycemia, unspecified: Secondary | ICD-10-CM | POA: Diagnosis not present

## 2023-07-11 DIAGNOSIS — R1310 Dysphagia, unspecified: Secondary | ICD-10-CM | POA: Diagnosis not present

## 2023-07-11 DIAGNOSIS — R0602 Shortness of breath: Secondary | ICD-10-CM | POA: Diagnosis not present

## 2023-07-11 DIAGNOSIS — K2289 Other specified disease of esophagus: Secondary | ICD-10-CM | POA: Insufficient documentation

## 2023-07-11 DIAGNOSIS — I213 ST elevation (STEMI) myocardial infarction of unspecified site: Secondary | ICD-10-CM | POA: Diagnosis not present

## 2023-07-11 DIAGNOSIS — R000 Tachycardia, unspecified: Secondary | ICD-10-CM | POA: Diagnosis not present

## 2023-07-11 LAB — COMPREHENSIVE METABOLIC PANEL
ALT: 16 U/L (ref 0–44)
AST: 14 U/L — ABNORMAL LOW (ref 15–41)
Albumin: 3.1 g/dL — ABNORMAL LOW (ref 3.5–5.0)
Alkaline Phosphatase: 83 U/L (ref 38–126)
Anion gap: 10 (ref 5–15)
BUN: 34 mg/dL — ABNORMAL HIGH (ref 8–23)
CO2: 30 mmol/L (ref 22–32)
Calcium: 9.4 mg/dL (ref 8.9–10.3)
Chloride: 91 mmol/L — ABNORMAL LOW (ref 98–111)
Creatinine, Ser: 1.05 mg/dL (ref 0.61–1.24)
GFR, Estimated: >60 mL/min (ref >60)
Glucose, Bld: 312 mg/dL — ABNORMAL HIGH (ref 70–99)
Potassium: 4.5 mmol/L (ref 3.5–5.1)
Sodium: 131 mmol/L — ABNORMAL LOW (ref 135–145)
Total Bilirubin: 0.5 mg/dL (ref 0.0–1.2)
Total Protein: 8.3 g/dL — ABNORMAL HIGH (ref 6.5–8.1)

## 2023-07-11 LAB — CBC WITH DIFFERENTIAL/PLATELET
Abs Immature Granulocytes: 0.08 10*3/uL — ABNORMAL HIGH (ref 0.00–0.07)
Basophils Absolute: 0 10*3/uL (ref 0.0–0.1)
Basophils Relative: 0 %
Eosinophils Absolute: 0.1 10*3/uL (ref 0.0–0.5)
Eosinophils Relative: 1 %
HCT: 30.5 % — ABNORMAL LOW (ref 39.0–52.0)
Hemoglobin: 9.1 g/dL — ABNORMAL LOW (ref 13.0–17.0)
Immature Granulocytes: 1 %
Lymphocytes Relative: 16 %
Lymphs Abs: 2.3 10*3/uL (ref 0.7–4.0)
MCH: 24.6 pg — ABNORMAL LOW (ref 26.0–34.0)
MCHC: 29.8 g/dL — ABNORMAL LOW (ref 30.0–36.0)
MCV: 82.4 fL (ref 80.0–100.0)
Monocytes Absolute: 1.2 10*3/uL — ABNORMAL HIGH (ref 0.1–1.0)
Monocytes Relative: 8 %
Neutro Abs: 10.7 10*3/uL — ABNORMAL HIGH (ref 1.7–7.7)
Neutrophils Relative %: 74 %
Platelets: 611 10*3/uL — ABNORMAL HIGH (ref 150–400)
RBC: 3.7 MIL/uL — ABNORMAL LOW (ref 4.22–5.81)
RDW: 15.7 % — ABNORMAL HIGH (ref 11.5–15.5)
WBC: 14.3 10*3/uL — ABNORMAL HIGH (ref 4.0–10.5)
nRBC: 0 % (ref 0.0–0.2)

## 2023-07-11 LAB — URINALYSIS, ROUTINE W REFLEX MICROSCOPIC
Bacteria, UA: NONE SEEN
Bilirubin Urine: NEGATIVE
Glucose, UA: >=500 mg/dL — AB
Hgb urine dipstick: NEGATIVE
Ketones, ur: NEGATIVE mg/dL
Leukocytes,Ua: NEGATIVE
Nitrite: NEGATIVE
Protein, ur: NEGATIVE mg/dL
Specific Gravity, Urine: 1.02 (ref 1.005–1.030)
pH: 7 (ref 5.0–8.0)

## 2023-07-11 LAB — LACTIC ACID, PLASMA: Lactic Acid, Venous: 1.6 mmol/L (ref 0.5–1.9)

## 2023-07-11 LAB — TROPONIN I (HIGH SENSITIVITY): Troponin I (High Sensitivity): 12 ng/L (ref <18)

## 2023-07-11 LAB — RESP PANEL BY RT-PCR (RSV, FLU A&B, COVID)  RVPGX2
Influenza A by PCR: NEGATIVE
Influenza B by PCR: NEGATIVE
Resp Syncytial Virus by PCR: NEGATIVE
SARS Coronavirus 2 by RT PCR: NEGATIVE

## 2023-07-11 MED ORDER — IPRATROPIUM-ALBUTEROL 0.5-2.5 (3) MG/3ML IN SOLN
3.0000 mL | Freq: Once | RESPIRATORY_TRACT | Status: AC
  Administered 2023-07-11: 3 mL via RESPIRATORY_TRACT
  Filled 2023-07-11: qty 3

## 2023-07-11 MED ORDER — SODIUM CHLORIDE 0.9 % IV BOLUS
1000.0000 mL | Freq: Once | INTRAVENOUS | Status: AC
  Administered 2023-07-11: 1000 mL via INTRAVENOUS

## 2023-07-11 MED ORDER — ALBUTEROL SULFATE HFA 108 (90 BASE) MCG/ACT IN AERS
2.0000 | INHALATION_SPRAY | Freq: Once | RESPIRATORY_TRACT | Status: AC
  Administered 2023-07-11: 2 via RESPIRATORY_TRACT
  Filled 2023-07-11: qty 6.7

## 2023-07-11 NOTE — ED Provider Notes (Signed)
 [place] [facility]PART[facility] AT [facility] Provider Note   CSN: [id number] Arrival date & time: 07/11/23  4098     History  Chief Complaint  Patient presents with   Weakness    Jesus [name] is a 68 y.o. male.  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  Patient has suffered from dysphagia over the last year.  He is scheduled to undergo esophageal dilation with Dr. Pollyann Kennedy next week.  He presents today as he has been feeling weaker than usual which he feels is secondary to dehydration.  No fevers, chills, chest pain, shortness of breath, nausea, vomiting or issues with the G-tube.  Has upcoming PCP appointment tomorrow   Weakness      Home Medications Prior to Admission medications   Medication Sig Start Date End Date Taking? Authorizing Provider  acetaminophen (TYLENOL) 160 MG/5ML solution Place 20.3 mLs (650 mg total) into feeding tube every 6 (six) hours as needed for mild pain (pain score 1-3). 06/20/23   Maczis, Elmer Sow, PA-C  albuterol (VENTOLIN HFA) 108 (90 Base) MCG/ACT inhaler Inhale 2 puffs into the lungs every 6 (six) hours as needed for wheezing or shortness of breath. 07/05/23   Marcelino Duster, MD  glycopyrrolate (ROBINUL) 1 MG tablet Take 1 tablet (1 mg total) by mouth every 4 (four) hours as needed (excessive secretions). Patient taking differently: Place 1 mg into feeding tube every other day. Crush tablet, place in water, and then place in feeding tube. 06/24/23   [place], Alvira Philips, DO  lidocaine-prilocaine (EMLA) cream Apply to affected area once 08/04/22   Johney Maine, MD  Nutritional Supplements (NUTREN 1.5) LIQD Give 2 cartons 4 times a day.  Flush with 30ml of water before and after each feeding.  Give additional of water TID for additional hydration. 06/26/23   Johney Maine, MD  oxyCODONE  (ROXICODONE) 5 MG/5ML solution Place 5 mLs (5 mg total) into feeding tube every 6 (six) hours as needed for breakthrough pain. 06/20/23   Maczis, Elmer Sow, PA-C  pantoprazole (PROTONIX) 40 MG tablet Take 1 tablet (40 mg total) by mouth daily. 07/05/23   Marcelino Duster, MD  Protein (FEEDING SUPPLEMENT, PROSOURCE TF20,) liquid Place 60 mLs into feeding tube daily. 07/06/23   Marcelino Duster, MD  Water For Irrigation, Sterile (FREE WATER) SOLN Place 125 mLs into feeding tube every 4 (four) hours. Patient taking differently: Place 60 mLs into feeding tube every 4 (four) hours. Use 30ml prior to feeding supplement, and 30ml after each feeding supplement 06/24/23 09/22/23  [place], Alvira Philips, DO      Allergies    Bee venom, Lisinopril, Antifungal [miconazole nitrate], and Zolpidem tartrate er    Review of Systems   Review of Systems  Neurological:  Positive for weakness.    Physical Exam Updated Vital Signs BP 121/77   Pulse (!) 121   Temp 98.9 F (37.2 C)   Resp 15   SpO2 99%  Physical Exam Vitals and nursing note reviewed.  HENT:     Head: Normocephalic and atraumatic.  Eyes:     Pupils: Pupils are equal, round, and reactive to light.  Cardiovascular:     Rate and Rhythm: Normal rate and regular rhythm.  Pulmonary:     Effort: Pulmonary effort is normal.     Breath sounds: Normal breath sounds.  Abdominal:  Palpations: Abdomen is soft.     Tenderness: There is no abdominal tenderness.     Comments: G-tube in place without surrounding erythema induration or drainage  Skin:    General: Skin is warm and dry.  Neurological:     Mental Status: He is alert.  Psychiatric:        Mood and Affect: Mood normal.     ED Results / Procedures / Treatments   Labs (all labs ordered are listed, but only abnormal results are displayed) Labs Reviewed  COMPREHENSIVE METABOLIC PANEL - Abnormal; Notable for the following components:      Result Value   Sodium 131 (*)    Chloride  91 (*)    Glucose, Bld 312 (*)    BUN 34 (*)    Total Protein 8.3 (*)    Albumin 3.1 (*)    AST 14 (*)    All other components within normal limits  CBC WITH DIFFERENTIAL/PLATELET - Abnormal; Notable for the following components:   WBC 14.3 (*)    RBC 3.70 (*)    Hemoglobin 9.1 (*)    HCT 30.5 (*)    MCH 24.6 (*)    MCHC 29.8 (*)    RDW 15.7 (*)    Platelets 611 (*)    Neutro Abs 10.7 (*)    Monocytes Absolute 1.2 (*)    Abs Immature Granulocytes 0.08 (*)    All other components within normal limits  RESP PANEL BY RT-PCR (RSV, FLU A&B, COVID)  [id number]  LACTIC ACID, PLASMA  LACTIC ACID, PLASMA  URINALYSIS, ROUTINE W REFLEX MICROSCOPIC  TROPONIN I (HIGH SENSITIVITY)  TROPONIN I (HIGH SENSITIVITY)    EKG EKG Interpretation Date/Time:  Tuesday July 11 2023 10:33:16 EST Ventricular Rate:  121 PR Interval:  116 QRS Duration:  151 QT Interval:  350 QTC Calculation: 497 R Axis:   -89  Text Interpretation: Sinus tachycardia Probable left atrial enlargement RBBB and LAFB Left ventricular hypertrophy Confirmed by Estelle June [phone]) on 07/11/2023 10:37:47 AM  Radiology No results found.  Procedures Procedures    Medications Ordered in ED Medications  ipratropium-albuterol (DUONEB) 0.5-2.5 (3) MG/3ML nebulizer solution 3 mL (has no administration in time range)  albuterol (VENTOLIN HFA) 108 (90 Base) MCG/ACT inhaler 2 puff (has no administration in time range)  sodium chloride 0.9 % bolus 1,000 mL (0 mLs Intravenous Stopped 07/11/23 1300)  sodium chloride 0.9 % bolus 1,000 mL (0 mLs Intravenous Stopped 07/11/23 1525)    ED Course/ Medical Decision Making/ A&P Clinical Course as of 07/11/23 1606  Tue Jul 11, 2023  1531 Laboratory workup notable for leukocytosis of 14.  No elevation in venous lactate and high sensitive troponin of 12 not consistent with ACS.  EKG appears similar to previous with no new ischemic changes  I reevaluated patient after 2 L of IV fluids.  He does  report feeling a bit better but still weak.  He now tells me he has been short of breath at home recently and no one can seem to figure out why.  He does have 2 recent CTAs to look for pulmonary embolism and we did not visualize any last time But did see an esophageal mass at the level of the thoracic inlet displacing the trachea which best represents most likely cause of his ongoing shortness of breath with superimposed COPD as well  We discussed the rationale for another CTA here today but given that the most likely cause of the ongoing shortness of  breath would be compression of the trachea.  He and his daughter recognize the risks of going home without doing another CTA and feel this is reasonable at this time.  We will give him a DuoNeb treatment and discharge him with an albuterol MDI and he will follow-up with his primary care doctor tomorrow morning [MP]    Clinical Course User Index [MP] Royanne Foots, DO                                 Medical Decision Making 68 year old male with history as above with G-tube in place scheduled for esophageal dilation next week presenting for generalized weakness.  Feels dehydrated secondary to decreased water intake from G-tube no infectious symptoms to speak of.  Afebrile.  Tachycardic low 100s here.  Stable on room air.  Will obtain basic laboratory workup to evaluate for leuko-cytosis electrolyte imbalance.  No urinary symptoms.  Will provide IV fluids for rehydration.  Has established PCP appointment tomorrow.  Amount and/or Complexity of Data Reviewed Labs: ordered. Radiology: ordered.  Risk Prescription drug management.           Final Clinical Impression(s) / ED Diagnoses Final diagnoses:  Weakness  Esophageal mass  Chronic obstructive pulmonary disease, unspecified COPD type [facility])    Rx / DC Orders ED Discharge Orders     None         Royanne Foots, DO 07/11/23 1606

## 2023-07-11 NOTE — Discharge Instructions (Addendum)
 You were seen in the Emergency Department for weakness We gave you 2 L of fluids as well as water through the G-tube which made you feel better As discussed the most likely cause of your shortness of breath and fast heart rate would be the mass that is pushing on your trachea It is important that you keep your appointment with your primary care doctor tomorrow and have the procedure done next week Use albuterol as directed at home Return to the Emergency Department for severe discomfort trouble breathing or any other concerns

## 2023-07-11 NOTE — ED Triage Notes (Signed)
 EMS reports from home, c/o generalized weakness since this morning. CA pt throat. Pt has g-tube.  BP 108/64 HR 115 RR 20 Sp02 95 RA CBG 494  20 LAC

## 2023-07-11 NOTE — Telephone Encounter (Signed)
 Marland Kitchen

## 2023-07-12 DIAGNOSIS — J438 Other emphysema: Secondary | ICD-10-CM | POA: Diagnosis not present

## 2023-07-12 DIAGNOSIS — C329 Malignant neoplasm of larynx, unspecified: Secondary | ICD-10-CM | POA: Diagnosis not present

## 2023-07-14 ENCOUNTER — Other Ambulatory Visit: Payer: Self-pay

## 2023-07-14 ENCOUNTER — Emergency Department (HOSPITAL_COMMUNITY)

## 2023-07-14 ENCOUNTER — Inpatient Hospital Stay (HOSPITAL_COMMUNITY)
Admission: EM | Admit: 2023-07-14 | Discharge: 2023-07-17 | DRG: 155 | Disposition: A | Discharge status: Home / Self Care | Attending: Family Medicine | Admitting: Family Medicine

## 2023-07-14 ENCOUNTER — Encounter (HOSPITAL_COMMUNITY): Payer: Self-pay | Admitting: Emergency Medicine

## 2023-07-14 DIAGNOSIS — C329 Malignant neoplasm of larynx, unspecified: Secondary | ICD-10-CM | POA: Diagnosis not present

## 2023-07-14 DIAGNOSIS — E1151 Type 2 diabetes mellitus with diabetic peripheral angiopathy without gangrene: Secondary | ICD-10-CM | POA: Diagnosis present

## 2023-07-14 DIAGNOSIS — D649 Anemia, unspecified: Secondary | ICD-10-CM | POA: Diagnosis present

## 2023-07-14 DIAGNOSIS — E441 Mild protein-calorie malnutrition: Secondary | ICD-10-CM | POA: Diagnosis present

## 2023-07-14 DIAGNOSIS — I700 Atherosclerosis of aorta: Secondary | ICD-10-CM | POA: Diagnosis present

## 2023-07-14 DIAGNOSIS — E66811 Obesity, class 1: Secondary | ICD-10-CM | POA: Diagnosis present

## 2023-07-14 DIAGNOSIS — J441 Chronic obstructive pulmonary disease with (acute) exacerbation: Secondary | ICD-10-CM | POA: Diagnosis present

## 2023-07-14 DIAGNOSIS — Z833 Family history of diabetes mellitus: Secondary | ICD-10-CM

## 2023-07-14 DIAGNOSIS — Z9221 Personal history of antineoplastic chemotherapy: Secondary | ICD-10-CM

## 2023-07-14 DIAGNOSIS — Z803 Family history of malignant neoplasm of breast: Secondary | ICD-10-CM

## 2023-07-14 DIAGNOSIS — Z818 Family history of other mental and behavioral disorders: Secondary | ICD-10-CM

## 2023-07-14 DIAGNOSIS — R1310 Dysphagia, unspecified: Secondary | ICD-10-CM | POA: Diagnosis present

## 2023-07-14 DIAGNOSIS — E871 Hypo-osmolality and hyponatremia: Secondary | ICD-10-CM | POA: Diagnosis present

## 2023-07-14 DIAGNOSIS — Z6830 Body mass index (BMI) 30.0-30.9, adult: Secondary | ICD-10-CM

## 2023-07-14 DIAGNOSIS — Z8249 Family history of ischemic heart disease and other diseases of the circulatory system: Secondary | ICD-10-CM

## 2023-07-14 DIAGNOSIS — Z91030 Bee allergy status: Secondary | ICD-10-CM

## 2023-07-14 DIAGNOSIS — R000 Tachycardia, unspecified: Secondary | ICD-10-CM | POA: Diagnosis not present

## 2023-07-14 DIAGNOSIS — D75839 Thrombocytosis, unspecified: Secondary | ICD-10-CM | POA: Diagnosis present

## 2023-07-14 DIAGNOSIS — I5189 Other ill-defined heart diseases: Secondary | ICD-10-CM

## 2023-07-14 DIAGNOSIS — J387 Other diseases of larynx: Principal | ICD-10-CM | POA: Diagnosis present

## 2023-07-14 DIAGNOSIS — J9601 Acute respiratory failure with hypoxia: Secondary | ICD-10-CM | POA: Diagnosis not present

## 2023-07-14 DIAGNOSIS — Z923 Personal history of irradiation: Secondary | ICD-10-CM

## 2023-07-14 DIAGNOSIS — Z800 Family history of malignant neoplasm of digestive organs: Secondary | ICD-10-CM

## 2023-07-14 DIAGNOSIS — E1165 Type 2 diabetes mellitus with hyperglycemia: Secondary | ICD-10-CM | POA: Diagnosis present

## 2023-07-14 DIAGNOSIS — Z888 Allergy status to other drugs, medicaments and biological substances status: Secondary | ICD-10-CM

## 2023-07-14 DIAGNOSIS — Z87891 Personal history of nicotine dependence: Secondary | ICD-10-CM

## 2023-07-14 DIAGNOSIS — I4510 Unspecified right bundle-branch block: Secondary | ICD-10-CM | POA: Diagnosis not present

## 2023-07-14 DIAGNOSIS — D75838 Other thrombocytosis: Secondary | ICD-10-CM | POA: Diagnosis present

## 2023-07-14 DIAGNOSIS — C7989 Secondary malignant neoplasm of other specified sites: Secondary | ICD-10-CM | POA: Diagnosis present

## 2023-07-14 DIAGNOSIS — E875 Hyperkalemia: Secondary | ICD-10-CM | POA: Diagnosis present

## 2023-07-14 DIAGNOSIS — Z79899 Other long term (current) drug therapy: Secondary | ICD-10-CM

## 2023-07-14 DIAGNOSIS — R0602 Shortness of breath: Secondary | ICD-10-CM | POA: Diagnosis not present

## 2023-07-14 DIAGNOSIS — R0689 Other abnormalities of breathing: Secondary | ICD-10-CM | POA: Diagnosis not present

## 2023-07-14 DIAGNOSIS — I10 Essential (primary) hypertension: Secondary | ICD-10-CM | POA: Diagnosis present

## 2023-07-14 DIAGNOSIS — D630 Anemia in neoplastic disease: Secondary | ICD-10-CM | POA: Diagnosis present

## 2023-07-14 DIAGNOSIS — Z8572 Personal history of non-Hodgkin lymphomas: Secondary | ICD-10-CM

## 2023-07-14 DIAGNOSIS — R739 Hyperglycemia, unspecified: Secondary | ICD-10-CM | POA: Diagnosis not present

## 2023-07-14 DIAGNOSIS — Z823 Family history of stroke: Secondary | ICD-10-CM

## 2023-07-14 DIAGNOSIS — Z1152 Encounter for screening for COVID-19: Secondary | ICD-10-CM

## 2023-07-14 DIAGNOSIS — R0902 Hypoxemia: Secondary | ICD-10-CM | POA: Diagnosis not present

## 2023-07-14 DIAGNOSIS — E782 Mixed hyperlipidemia: Secondary | ICD-10-CM | POA: Diagnosis present

## 2023-07-14 LAB — URINALYSIS, W/ REFLEX TO CULTURE (INFECTION SUSPECTED)
Bacteria, UA: NONE SEEN
Bilirubin Urine: NEGATIVE
Glucose, UA: >=500 mg/dL — AB
Hgb urine dipstick: NEGATIVE
Ketones, ur: NEGATIVE mg/dL
Nitrite: NEGATIVE
Protein, ur: 30 mg/dL — AB
Specific Gravity, Urine: 1.022 (ref 1.005–1.030)
pH: 5 (ref 5.0–8.0)

## 2023-07-14 LAB — CBC WITH DIFFERENTIAL/PLATELET
Abs Immature Granulocytes: 0.1 10*3/uL — ABNORMAL HIGH (ref 0.00–0.07)
Basophils Absolute: 0 10*3/uL (ref 0.0–0.1)
Basophils Relative: 0 %
Eosinophils Absolute: 0 10*3/uL (ref 0.0–0.5)
Eosinophils Relative: 0 %
HCT: 28 % — ABNORMAL LOW (ref 39.0–52.0)
Hemoglobin: 8.5 g/dL — ABNORMAL LOW (ref 13.0–17.0)
Immature Granulocytes: 1 %
Lymphocytes Relative: 3 %
Lymphs Abs: 0.4 10*3/uL — ABNORMAL LOW (ref 0.7–4.0)
MCH: 24.7 pg — ABNORMAL LOW (ref 26.0–34.0)
MCHC: 30.4 g/dL (ref 30.0–36.0)
MCV: 81.4 fL (ref 80.0–100.0)
Monocytes Absolute: 0.2 10*3/uL (ref 0.1–1.0)
Monocytes Relative: 1 %
Neutro Abs: 14.1 10*3/uL — ABNORMAL HIGH (ref 1.7–7.7)
Neutrophils Relative %: 95 %
Platelets: 515 10*3/uL — ABNORMAL HIGH (ref 150–400)
RBC: 3.44 MIL/uL — ABNORMAL LOW (ref 4.22–5.81)
RDW: 15.1 % (ref 11.5–15.5)
WBC: 14.7 10*3/uL — ABNORMAL HIGH (ref 4.0–10.5)
nRBC: 0 % (ref 0.0–0.2)

## 2023-07-14 LAB — LACTIC ACID, PLASMA: Lactic Acid, Venous: 1.1 mmol/L (ref 0.5–1.9)

## 2023-07-14 LAB — COMPREHENSIVE METABOLIC PANEL
ALT: 19 U/L (ref 0–44)
AST: 15 U/L (ref 15–41)
Albumin: 3.2 g/dL — ABNORMAL LOW (ref 3.5–5.0)
Alkaline Phosphatase: 106 U/L (ref 38–126)
Anion gap: 12 (ref 5–15)
BUN: 26 mg/dL — ABNORMAL HIGH (ref 8–23)
CO2: 27 mmol/L (ref 22–32)
Calcium: 8.9 mg/dL (ref 8.9–10.3)
Chloride: 87 mmol/L — ABNORMAL LOW (ref 98–111)
Creatinine, Ser: 0.9 mg/dL (ref 0.61–1.24)
GFR, Estimated: >60 mL/min (ref >60)
Glucose, Bld: 463 mg/dL — ABNORMAL HIGH (ref 70–99)
Potassium: 5 mmol/L (ref 3.5–5.1)
Sodium: 126 mmol/L — ABNORMAL LOW (ref 135–145)
Total Bilirubin: 0.6 mg/dL (ref 0.0–1.2)
Total Protein: 8.6 g/dL — ABNORMAL HIGH (ref 6.5–8.1)

## 2023-07-14 LAB — PROTIME-INR
INR: 1.2 (ref 0.8–1.2)
Prothrombin Time: 14.9 s (ref 11.4–15.2)

## 2023-07-14 MED ORDER — DILTIAZEM HCL-DEXTROSE 125-5 MG/125ML-% IV SOLN (PREMIX)
5.0000 mg/h | INTRAVENOUS | Status: DC
  Filled 2023-07-14: qty 125

## 2023-07-14 MED ORDER — LACTATED RINGERS IV BOLUS (SEPSIS)
1000.0000 mL | Freq: Once | INTRAVENOUS | Status: AC
  Administered 2023-07-15: 1000 mL via INTRAVENOUS

## 2023-07-14 MED ORDER — DILTIAZEM LOAD VIA INFUSION
20.0000 mg | Freq: Once | INTRAVENOUS | Status: DC
  Filled 2023-07-14: qty 20

## 2023-07-14 MED ORDER — LACTATED RINGERS IV SOLN: INTRAVENOUS | Status: AC

## 2023-07-14 MED ORDER — VANCOMYCIN HCL IN DEXTROSE 1-5 GM/200ML-% IV SOLN
1000.0000 mg | Freq: Once | INTRAVENOUS | Status: DC
  Filled 2023-07-14: qty 200

## 2023-07-14 MED ORDER — VANCOMYCIN HCL 2000 MG/400ML IV SOLN
2000.0000 mg | Freq: Once | INTRAVENOUS | Status: AC
Start: 2023-07-15 — End: 2023-07-15
  Administered 2023-07-15: 2000 mg via INTRAVENOUS
  Filled 2023-07-14: qty 400

## 2023-07-14 MED ORDER — SODIUM CHLORIDE 0.9 % IV BOLUS
500.0000 mL | Freq: Once | INTRAVENOUS | Status: AC
  Administered 2023-07-14: 500 mL via INTRAVENOUS

## 2023-07-14 MED ORDER — SODIUM CHLORIDE 0.9 % IV SOLN
2.0000 g | Freq: Once | INTRAVENOUS | Status: AC
  Administered 2023-07-14: 2 g via INTRAVENOUS
  Filled 2023-07-14: qty 12.5

## 2023-07-14 MED ORDER — NITROGLYCERIN 2 % TD OINT
0.5000 [in_us] | TOPICAL_OINTMENT | Freq: Once | TRANSDERMAL | Status: AC
  Administered 2023-07-14: 0.5 [in_us] via TOPICAL
  Filled 2023-07-14 (×2): qty 1

## 2023-07-14 NOTE — ED Provider Notes (Incomplete)
 [place] EMERGENCY DEPARTMENT AT [facility] Provider Note   CSN: [id number] Arrival date & time: 07/14/23  2056     History {Add pertinent medical, surgical, social history, OB history to HPI:1} Chief Complaint  Patient presents with  . Shortness of Breath    Jesus [name] is a 68 y.o. male.  HPI   68 year old male with pmhx of laryngeal cancer, PEG dependent presents from home by EMS for acute SOB. History limited 2/2 acute respiratory distress on arrival. Arrives on NRB, increased work of breathing, diaphoresis.   Home Medications Prior to Admission medications   Medication Sig Start Date End Date Taking? Authorizing Provider  acetaminophen (TYLENOL) 160 MG/5ML solution Place 20.3 mLs (650 mg total) into feeding tube every 6 (six) hours as needed for mild pain (pain score 1-3). 06/20/23   Maczis, Elmer Sow, PA-C  albuterol (VENTOLIN HFA) 108 (90 Base) MCG/ACT inhaler Inhale 2 puffs into the lungs every 6 (six) hours as needed for wheezing or shortness of breath. 07/05/23   Marcelino Duster, MD  glycopyrrolate (ROBINUL) 1 MG tablet Take 1 tablet (1 mg total) by mouth every 4 (four) hours as needed (excessive secretions). Patient taking differently: Place 1 mg into feeding tube every other day. Crush tablet, place in water, and then place in feeding tube. 06/24/23   [place], Alvira Philips, DO  lidocaine-prilocaine (EMLA) cream Apply to affected area once 08/04/22   Johney Maine, MD  Nutritional Supplements (NUTREN 1.5) LIQD Give 2 cartons 4 times a day.  Flush with 30ml of water before and after each feeding.  Give additional of water TID for additional hydration. 06/26/23   Johney Maine, MD  oxyCODONE (ROXICODONE) 5 MG/5ML solution Place 5 mLs (5 mg total) into feeding tube every 6 (six) hours as needed for breakthrough pain. 06/20/23   Maczis, Elmer Sow, PA-C  pantoprazole (PROTONIX) 40 MG tablet Take 1 tablet (40 mg total) by mouth daily. 07/05/23    Marcelino Duster, MD  Protein (FEEDING SUPPLEMENT, PROSOURCE TF20,) liquid Place 60 mLs into feeding tube daily. 07/06/23   Marcelino Duster, MD  Water For Irrigation, Sterile (FREE WATER) SOLN Place 125 mLs into feeding tube every 4 (four) hours. Patient taking differently: Place 60 mLs into feeding tube every 4 (four) hours. Use 30ml prior to feeding supplement, and 30ml after each feeding supplement 06/24/23 09/22/23  [place], Alvira Philips, DO      Allergies    Bee venom, Lisinopril, Antifungal [miconazole nitrate], and Zolpidem tartrate er    Review of Systems   Review of Systems  Unable to perform ROS: Acuity of condition    Physical Exam Updated Vital Signs BP (!) 134/95   Pulse (!) 133   Resp 20   Ht 6' (1.829 m)   Wt 104.3 kg   SpO2 99%   BMI 31.19 kg/m  Physical Exam Constitutional:      General: He is in acute distress.     Appearance: He is ill-appearing, toxic-appearing and diaphoretic.  HENT:     Head: Normocephalic.  Cardiovascular:     Rate and Rhythm: Regular rhythm. Tachycardia present.  Pulmonary:     Effort: Tachypnea, accessory muscle usage and respiratory distress present.     Breath sounds: Examination of the right-lower field reveals decreased breath sounds. Examination of the left-lower field reveals decreased breath sounds. Decreased breath sounds, rhonchi and rales present.  Abdominal:     Palpations: Abdomen is soft.  Musculoskeletal:  Right lower leg: Edema present.     Left lower leg: Edema present.  Neurological:     Mental Status: He is alert.     ED Results / Procedures / Treatments   Labs (all labs ordered are listed, but only abnormal results are displayed) Labs Reviewed  CBC WITH DIFFERENTIAL/PLATELET - Abnormal; Notable for the following components:      Result Value   WBC 14.7 (*)    RBC 3.44 (*)    Hemoglobin 8.5 (*)    HCT 28.0 (*)    MCH 24.7 (*)    Platelets 515 (*)    Neutro Abs 14.1 (*)    Lymphs Abs 0.4 (*)     Abs Immature Granulocytes 0.10 (*)    All other components within normal limits  URINALYSIS, W/ REFLEX TO CULTURE (INFECTION SUSPECTED) - Abnormal; Notable for the following components:   Glucose, UA >=500 (*)    Protein, ur 30 (*)    Leukocytes,Ua SMALL (*)    All other components within normal limits  CULTURE, BLOOD (ROUTINE X 2)  CULTURE, BLOOD (ROUTINE X 2)  COMPREHENSIVE METABOLIC PANEL  PROTIME-INR  BRAIN NATRIURETIC PEPTIDE  MAGNESIUM  LACTIC ACID, PLASMA  LACTIC ACID, PLASMA  TROPONIN I (HIGH SENSITIVITY)  TROPONIN I (HIGH SENSITIVITY)    EKG EKG Interpretation Date/Time:  Friday July 14 2023 21:07:49 EST Ventricular Rate:  138 PR Interval:  65 QRS Duration:  153 QT Interval:  357 QTC Calculation: 541 R Axis:   -84  Text Interpretation: Sinus or ectopic atrial tachycardia RBBB and LAFB Left ventricular hypertrophy Inferior infarct, acute (LCx) Lateral leads are also involved >>> Acute MI <<< Similar morphology to previous Confirmed by Coralee Pesa [id number]) on 07/14/2023 9:36:38 PM  Radiology No results found.  Procedures .Critical Care  Performed by: Rozelle Logan, DO Authorized by: Rozelle Logan, DO   Critical care provider statement:    Critical care time (minutes):  75   Critical care time was exclusive of:  Separately billable procedures and treating other patients   Critical care was necessary to treat or prevent imminent or life-threatening deterioration of the following conditions:  Respiratory failure, sepsis and cardiac failure   Critical care was time spent personally by me on the following activities:  Development of treatment plan with patient or surrogate, discussions with consultants, evaluation of patient's response to treatment, examination of patient, ordering and review of laboratory studies, ordering and review of radiographic studies, ordering and performing treatments and interventions, pulse oximetry, re-evaluation of patient's  condition and review of old charts   I assumed direction of critical care for this patient from another provider in my specialty: no     Care discussed with: admitting provider     {Document cardiac monitor, telemetry assessment procedure when appropriate:1}  Medications Ordered in ED Medications  diltiazem (CARDIZEM) 1 mg/mL load via infusion 20 mg (has no administration in time range)    And  diltiazem (CARDIZEM) 125 mg in dextrose 5% 125 mL (1 mg/mL) infusion (has no administration in time range)  nitroGLYCERIN (NITROGLYN) 2 % ointment 0.5 inch (0.5 inches Topical Given 07/14/23 2145)  sodium chloride 0.9 % bolus 500 mL (0 mLs Intravenous Stopped 07/14/23 2213)    ED Course/ Medical Decision Making/ A&P   {   Click here for ABCD2, HEART and other calculatorsREFRESH Note before signing :1}  Medical Decision Making Amount and/or Complexity of Data Reviewed Labs: ordered. Radiology: ordered.  Risk Prescription drug management.   ***  {Document critical care time when appropriate:1} {Document review of labs and clinical decision tools ie heart score, Chads2Vasc2 etc:1}  {Document your independent review of radiology images, and any outside records:1} {Document your discussion with family members, caretakers, and with consultants:1} {Document social determinants of health affecting pt's care:1} {Document your decision making why or why not admission, treatments were needed:1} Final Clinical Impression(s) / ED Diagnoses Final diagnoses:  None    Rx / DC Orders ED Discharge Orders     None

## 2023-07-14 NOTE — ED Triage Notes (Signed)
 68 y/o male comes in by EMS c/o SOB that has been "getting" worse since 7pm this evening. On arrival, EMS reports "he was 70% on room air." Pt has a recent hospital admission and is currently receiving chemo treatment for throat cancer. Pt given 125mg  of Solumedrol, 2g of Mag IV and one breathing treatment

## 2023-07-14 NOTE — ED Provider Notes (Signed)
 [place] EMERGENCY DEPARTMENT AT [facility] Provider Note   CSN: [id number] Arrival date & time: 07/14/23  2056     History  Chief Complaint  Patient presents with   Shortness of Breath    Jesus [name] is a 68 y.o. male.  HPI   68 year old male with pmhx of laryngeal cancer, PEG dependent presents from home by EMS for acute SOB. History limited 2/2 acute respiratory distress on arrival. Arrives on NRB, increased work of breathing, diaphoresis.   Home Medications Prior to Admission medications   Medication Sig Start Date End Date Taking? Authorizing Provider  acetaminophen (TYLENOL) 160 MG/5ML solution Place 20.3 mLs (650 mg total) into feeding tube every 6 (six) hours as needed for mild pain (pain score 1-3). 06/20/23   Maczis, Elmer Sow, PA-C  albuterol (VENTOLIN HFA) 108 (90 Base) MCG/ACT inhaler Inhale 2 puffs into the lungs every 6 (six) hours as needed for wheezing or shortness of breath. 07/05/23   Marcelino Duster, MD  glycopyrrolate (ROBINUL) 1 MG tablet Take 1 tablet (1 mg total) by mouth every 4 (four) hours as needed (excessive secretions). Patient taking differently: Place 1 mg into feeding tube every other day. Crush tablet, place in water, and then place in feeding tube. 06/24/23   [place], Alvira Philips, DO  lidocaine-prilocaine (EMLA) cream Apply to affected area once 08/04/22   Johney Maine, MD  Nutritional Supplements (NUTREN 1.5) LIQD Give 2 cartons 4 times a day.  Flush with 30ml of water before and after each feeding.  Give additional of water TID for additional hydration. 06/26/23   Johney Maine, MD  oxyCODONE (ROXICODONE) 5 MG/5ML solution Place 5 mLs (5 mg total) into feeding tube every 6 (six) hours as needed for breakthrough pain. 06/20/23   Maczis, Elmer Sow, PA-C  pantoprazole (PROTONIX) 40 MG tablet Take 1 tablet (40 mg total) by mouth daily. 07/05/23   Marcelino Duster, MD  Protein (FEEDING SUPPLEMENT, PROSOURCE TF20,)  liquid Place 60 mLs into feeding tube daily. 07/06/23   Marcelino Duster, MD  Water For Irrigation, Sterile (FREE WATER) SOLN Place 125 mLs into feeding tube every 4 (four) hours. Patient taking differently: Place 60 mLs into feeding tube every 4 (four) hours. Use 30ml prior to feeding supplement, and 30ml after each feeding supplement 06/24/23 09/22/23  [place], Alvira Philips, DO      Allergies    Bee venom, Lisinopril, Antifungal [miconazole nitrate], and Zolpidem tartrate er    Review of Systems   Review of Systems  Unable to perform ROS: Acuity of condition    Physical Exam Updated Vital Signs BP (!) 134/95   Pulse (!) 133   Resp 20   Ht 6' (1.829 m)   Wt 104.3 kg   SpO2 99%   BMI 31.19 kg/m  Physical Exam Constitutional:      General: He is in acute distress.     Appearance: He is ill-appearing, toxic-appearing and diaphoretic.  HENT:     Head: Normocephalic.  Cardiovascular:     Rate and Rhythm: Regular rhythm. Tachycardia present.  Pulmonary:     Effort: Tachypnea, accessory muscle usage and respiratory distress present.     Breath sounds: Examination of the right-lower field reveals decreased breath sounds. Examination of the left-lower field reveals decreased breath sounds. Decreased breath sounds, rhonchi and rales present.  Abdominal:     Palpations: Abdomen is soft.  Musculoskeletal:     Right lower leg: Edema present.  Left lower leg: Edema present.  Neurological:     Mental Status: He is alert.     ED Results / Procedures / Treatments   Labs (all labs ordered are listed, but only abnormal results are displayed) Labs Reviewed  CBC WITH DIFFERENTIAL/PLATELET - Abnormal; Notable for the following components:      Result Value   WBC 14.7 (*)    RBC 3.44 (*)    Hemoglobin 8.5 (*)    HCT 28.0 (*)    MCH 24.7 (*)    Platelets 515 (*)    Neutro Abs 14.1 (*)    Lymphs Abs 0.4 (*)    Abs Immature Granulocytes 0.10 (*)    All other components within normal  limits  URINALYSIS, W/ REFLEX TO CULTURE (INFECTION SUSPECTED) - Abnormal; Notable for the following components:   Glucose, UA >=500 (*)    Protein, ur 30 (*)    Leukocytes,Ua SMALL (*)    All other components within normal limits  CULTURE, BLOOD (ROUTINE X 2)  CULTURE, BLOOD (ROUTINE X 2)  COMPREHENSIVE METABOLIC PANEL  PROTIME-INR  BRAIN NATRIURETIC PEPTIDE  MAGNESIUM  LACTIC ACID, PLASMA  LACTIC ACID, PLASMA  TROPONIN I (HIGH SENSITIVITY)  TROPONIN I (HIGH SENSITIVITY)    EKG EKG Interpretation Date/Time:  Friday July 14 2023 21:07:49 EST Ventricular Rate:  138 PR Interval:  65 QRS Duration:  153 QT Interval:  357 QTC Calculation: 541 R Axis:   -84  Text Interpretation: Sinus or ectopic atrial tachycardia RBBB and LAFB Left ventricular hypertrophy Inferior infarct, acute (LCx) Lateral leads are also involved >>> Acute MI <<< Similar morphology to previous Confirmed by Coralee Pesa [phone]) on 07/14/2023 9:36:38 PM  Radiology No results found.  Procedures .Critical Care  Performed by: Rozelle Logan, DO Authorized by: Rozelle Logan, DO   Critical care provider statement:    Critical care time (minutes):  75   Critical care time was exclusive of:  Separately billable procedures and treating other patients   Critical care was necessary to treat or prevent imminent or life-threatening deterioration of the following conditions:  Respiratory failure, sepsis and cardiac failure   Critical care was time spent personally by me on the following activities:  Development of treatment plan with patient or surrogate, discussions with consultants, evaluation of patient's response to treatment, examination of patient, ordering and review of laboratory studies, ordering and review of radiographic studies, ordering and performing treatments and interventions, pulse oximetry, re-evaluation of patient's condition and review of old charts   I assumed direction of critical care for  this patient from another provider in my specialty: no     Care discussed with: admitting provider       Medications Ordered in ED Medications  diltiazem (CARDIZEM) 1 mg/mL load via infusion 20 mg (has no administration in time range)    And  diltiazem (CARDIZEM) 125 mg in dextrose 5% 125 mL (1 mg/mL) infusion (has no administration in time range)  nitroGLYCERIN (NITROGLYN) 2 % ointment 0.5 inch (0.5 inches Topical Given 07/14/23 2145)  sodium chloride 0.9 % bolus 500 mL (0 mLs Intravenous Stopped 07/14/23 2213)    ED Course/ Medical Decision Making/ A&P                                 Medical Decision Making Amount and/or Complexity of Data Reviewed Labs: ordered. Radiology: ordered.  Risk Prescription drug management.   68 year old  male with laryngeal cancer presents to the emergency department acute respiratory distress.  Found at home to be hypoxic into the 70s on room air which is his baseline.  Was given breathing treatments, Solu-Medrol and magnesium and route, arrives with on a nonrebreather with a breathing treatment completing.  History limited secondary to acute respiratory distress.  Patient is sitting up, diaphoretic, increased work of breathing, scattered rales and diminished breath sounds at bases.  EKG shows sinus rhythm versus atrial tachycardia, widened QRS which is baseline for the patient.  Heart rate is varying on the monitor, less likely SVT at this time.  Blood pressure is hypertensive.  Portable chest x-ray showed findings concerning for possible pulmonary edema.  Nitro placed placed.  Patient was able to be weaned from the nonrebreather to nasal cannula.  Given the history of the laryngeal mass with stridor on exam BiPAP held off for concern of worsening inflammation/bleeding.  Discussed with the patient, he is full code and would want intubation if necessary.  Review of chart shows a recent anesthesia intubation about a month ago with video laryngoscopy without  difficulty.  Patient given a small fluid bolus, heart rate appears responsive, blood pressure is improved with the nitroglycerin.  Blood work shows a leukocytosis, baseline anemia, normal kidney function with mild hyponatremia.  Lactic acid is 1.1.  Concern for possible SIRS/sepsis.  Patient had recent hospital admission, covered for healthcare pneumonia.  Initially Cardizem was ordered for concern of possible rate related heart failure.  Review of recent echo about a week ago shows EF of 65 to 70%.  Initial troponin is 75, BNP is pending, magnesium is normal.  Respiratory panel ordered, VBG pending.  Further fluids ordered.  Chest x-ray findings read as infrahilar atelectasis versus infiltrate.  Will plan to get CT PE study to further characterize chest x-ray findings and rule out PE.  On reevaluation respirations are significantly improved, patient is tolerating nasal cannula.  Heart rate is improving into the 120s with stable blood pressure.  Patient is pending remainder of workup but will require admission.  Patient signed out to Dr. Elayne Snare.        Final Clinical Impression(s) / ED Diagnoses Final diagnoses:  None    Rx / DC Orders ED Discharge Orders     None         Rozelle Logan, DO 07/15/23 0020

## 2023-07-15 ENCOUNTER — Emergency Department (HOSPITAL_COMMUNITY)

## 2023-07-15 ENCOUNTER — Encounter (HOSPITAL_COMMUNITY): Payer: Self-pay

## 2023-07-15 DIAGNOSIS — Z87891 Personal history of nicotine dependence: Secondary | ICD-10-CM | POA: Diagnosis not present

## 2023-07-15 DIAGNOSIS — R1314 Dysphagia, pharyngoesophageal phase: Secondary | ICD-10-CM | POA: Diagnosis not present

## 2023-07-15 DIAGNOSIS — E871 Hypo-osmolality and hyponatremia: Secondary | ICD-10-CM | POA: Diagnosis not present

## 2023-07-15 DIAGNOSIS — Z79899 Other long term (current) drug therapy: Secondary | ICD-10-CM | POA: Diagnosis not present

## 2023-07-15 DIAGNOSIS — Z1152 Encounter for screening for COVID-19: Secondary | ICD-10-CM | POA: Diagnosis not present

## 2023-07-15 DIAGNOSIS — J9601 Acute respiratory failure with hypoxia: Secondary | ICD-10-CM | POA: Diagnosis present

## 2023-07-15 DIAGNOSIS — D649 Anemia, unspecified: Secondary | ICD-10-CM | POA: Diagnosis present

## 2023-07-15 DIAGNOSIS — E1165 Type 2 diabetes mellitus with hyperglycemia: Secondary | ICD-10-CM | POA: Diagnosis not present

## 2023-07-15 DIAGNOSIS — Z8249 Family history of ischemic heart disease and other diseases of the circulatory system: Secondary | ICD-10-CM | POA: Diagnosis not present

## 2023-07-15 DIAGNOSIS — I5189 Other ill-defined heart diseases: Secondary | ICD-10-CM

## 2023-07-15 DIAGNOSIS — D75839 Thrombocytosis, unspecified: Secondary | ICD-10-CM | POA: Diagnosis present

## 2023-07-15 DIAGNOSIS — E782 Mixed hyperlipidemia: Secondary | ICD-10-CM | POA: Diagnosis not present

## 2023-07-15 DIAGNOSIS — E875 Hyperkalemia: Secondary | ICD-10-CM | POA: Diagnosis present

## 2023-07-15 DIAGNOSIS — R590 Localized enlarged lymph nodes: Secondary | ICD-10-CM | POA: Insufficient documentation

## 2023-07-15 DIAGNOSIS — Z8572 Personal history of non-Hodgkin lymphomas: Secondary | ICD-10-CM | POA: Diagnosis not present

## 2023-07-15 DIAGNOSIS — C320 Malignant neoplasm of glottis: Secondary | ICD-10-CM | POA: Diagnosis not present

## 2023-07-15 DIAGNOSIS — Z923 Personal history of irradiation: Secondary | ICD-10-CM | POA: Diagnosis not present

## 2023-07-15 DIAGNOSIS — R1310 Dysphagia, unspecified: Secondary | ICD-10-CM | POA: Diagnosis not present

## 2023-07-15 DIAGNOSIS — E1151 Type 2 diabetes mellitus with diabetic peripheral angiopathy without gangrene: Secondary | ICD-10-CM | POA: Diagnosis not present

## 2023-07-15 DIAGNOSIS — Z6830 Body mass index (BMI) 30.0-30.9, adult: Secondary | ICD-10-CM | POA: Diagnosis not present

## 2023-07-15 DIAGNOSIS — Z888 Allergy status to other drugs, medicaments and biological substances status: Secondary | ICD-10-CM | POA: Diagnosis not present

## 2023-07-15 DIAGNOSIS — N281 Cyst of kidney, acquired: Secondary | ICD-10-CM | POA: Diagnosis not present

## 2023-07-15 DIAGNOSIS — Z91030 Bee allergy status: Secondary | ICD-10-CM | POA: Diagnosis not present

## 2023-07-15 DIAGNOSIS — E66811 Obesity, class 1: Secondary | ICD-10-CM | POA: Diagnosis not present

## 2023-07-15 DIAGNOSIS — D630 Anemia in neoplastic disease: Secondary | ICD-10-CM | POA: Diagnosis not present

## 2023-07-15 DIAGNOSIS — Z9221 Personal history of antineoplastic chemotherapy: Secondary | ICD-10-CM | POA: Diagnosis not present

## 2023-07-15 DIAGNOSIS — I10 Essential (primary) hypertension: Secondary | ICD-10-CM | POA: Diagnosis not present

## 2023-07-15 DIAGNOSIS — I700 Atherosclerosis of aorta: Secondary | ICD-10-CM | POA: Diagnosis not present

## 2023-07-15 DIAGNOSIS — C329 Malignant neoplasm of larynx, unspecified: Secondary | ICD-10-CM | POA: Diagnosis not present

## 2023-07-15 DIAGNOSIS — E441 Mild protein-calorie malnutrition: Secondary | ICD-10-CM | POA: Diagnosis present

## 2023-07-15 DIAGNOSIS — J387 Other diseases of larynx: Secondary | ICD-10-CM | POA: Diagnosis not present

## 2023-07-15 DIAGNOSIS — J441 Chronic obstructive pulmonary disease with (acute) exacerbation: Secondary | ICD-10-CM | POA: Diagnosis not present

## 2023-07-15 LAB — COMPREHENSIVE METABOLIC PANEL
ALT: 18 U/L (ref 0–44)
AST: 16 U/L (ref 15–41)
Albumin: 3 g/dL — ABNORMAL LOW (ref 3.5–5.0)
Alkaline Phosphatase: 100 U/L (ref 38–126)
Anion gap: 9 (ref 5–15)
BUN: 26 mg/dL — ABNORMAL HIGH (ref 8–23)
CO2: 28 mmol/L (ref 22–32)
Calcium: 9.1 mg/dL (ref 8.9–10.3)
Chloride: 90 mmol/L — ABNORMAL LOW (ref 98–111)
Creatinine, Ser: 0.94 mg/dL (ref 0.61–1.24)
GFR, Estimated: >60 mL/min (ref >60)
Glucose, Bld: 394 mg/dL — ABNORMAL HIGH (ref 70–99)
Potassium: 5.5 mmol/L — ABNORMAL HIGH (ref 3.5–5.1)
Sodium: 127 mmol/L — ABNORMAL LOW (ref 135–145)
Total Bilirubin: 0.5 mg/dL (ref 0.0–1.2)
Total Protein: 8.4 g/dL — ABNORMAL HIGH (ref 6.5–8.1)

## 2023-07-15 LAB — CBC WITH DIFFERENTIAL/PLATELET
Abs Immature Granulocytes: 0.05 10*3/uL (ref 0.00–0.07)
Basophils Absolute: 0 10*3/uL (ref 0.0–0.1)
Basophils Relative: 0 %
Eosinophils Absolute: 0 10*3/uL (ref 0.0–0.5)
Eosinophils Relative: 0 %
HCT: 28.1 % — ABNORMAL LOW (ref 39.0–52.0)
Hemoglobin: 8.2 g/dL — ABNORMAL LOW (ref 13.0–17.0)
Immature Granulocytes: 1 %
Lymphocytes Relative: 6 %
Lymphs Abs: 0.6 10*3/uL — ABNORMAL LOW (ref 0.7–4.0)
MCH: 24 pg — ABNORMAL LOW (ref 26.0–34.0)
MCHC: 29.2 g/dL — ABNORMAL LOW (ref 30.0–36.0)
MCV: 82.4 fL (ref 80.0–100.0)
Monocytes Absolute: 0.1 10*3/uL (ref 0.1–1.0)
Monocytes Relative: 1 %
Neutro Abs: 10.1 10*3/uL — ABNORMAL HIGH (ref 1.7–7.7)
Neutrophils Relative %: 92 %
Platelets: 493 10*3/uL — ABNORMAL HIGH (ref 150–400)
RBC: 3.41 MIL/uL — ABNORMAL LOW (ref 4.22–5.81)
RDW: 15.1 % (ref 11.5–15.5)
WBC: 10.9 10*3/uL — ABNORMAL HIGH (ref 4.0–10.5)
nRBC: 0 % (ref 0.0–0.2)

## 2023-07-15 LAB — BLOOD GAS, VENOUS
Acid-Base Excess: 2.8 mmol/L — ABNORMAL HIGH (ref 0.0–2.0)
Bicarbonate: 31.3 mmol/L — ABNORMAL HIGH (ref 20.0–28.0)
O2 Saturation: 67.6 %
Patient temperature: 37
pCO2, Ven: 65 mmHg — ABNORMAL HIGH (ref 44–60)
pH, Ven: 7.29 (ref 7.25–7.43)
pO2, Ven: 38 mmHg (ref 32–45)

## 2023-07-15 LAB — RESP PANEL BY RT-PCR (RSV, FLU A&B, COVID)  RVPGX2
Influenza A by PCR: NEGATIVE
Influenza B by PCR: NEGATIVE
Resp Syncytial Virus by PCR: NEGATIVE
SARS Coronavirus 2 by RT PCR: NEGATIVE

## 2023-07-15 LAB — GLUCOSE, CAPILLARY
Glucose-Capillary: 229 mg/dL — ABNORMAL HIGH (ref 70–99)
Glucose-Capillary: 300 mg/dL — ABNORMAL HIGH (ref 70–99)

## 2023-07-15 LAB — BRAIN NATRIURETIC PEPTIDE: B Natriuretic Peptide: 108.6 pg/mL — ABNORMAL HIGH (ref 0.0–100.0)

## 2023-07-15 LAB — TROPONIN I (HIGH SENSITIVITY): Troponin I (High Sensitivity): 75 ng/L — ABNORMAL HIGH (ref <18)

## 2023-07-15 LAB — CBG MONITORING, ED
Glucose-Capillary: 354 mg/dL — ABNORMAL HIGH (ref 70–99)
Glucose-Capillary: 329 mg/dL — ABNORMAL HIGH (ref 70–99)

## 2023-07-15 LAB — PROCALCITONIN: Procalcitonin: 0.39 ng/mL

## 2023-07-15 LAB — MAGNESIUM
Magnesium: 2.2 mg/dL (ref 1.7–2.4)
Magnesium: 2.2 mg/dL (ref 1.7–2.4)

## 2023-07-15 LAB — PHOSPHORUS: Phosphorus: 4.5 mg/dL (ref 2.5–4.6)

## 2023-07-15 MED ORDER — PANTOPRAZOLE SODIUM 40 MG IV SOLR
40.0000 mg | INTRAVENOUS | Status: DC
  Administered 2023-07-15 – 2023-07-16 (×2): 40 mg via INTRAVENOUS
  Filled 2023-07-15 (×2): qty 10

## 2023-07-15 MED ORDER — ALBUTEROL SULFATE (2.5 MG/3ML) 0.083% IN NEBU
2.5000 mg | INHALATION_SOLUTION | Freq: Four times a day (QID) | RESPIRATORY_TRACT | Status: DC
  Administered 2023-07-15 – 2023-07-17 (×6): 2.5 mg via RESPIRATORY_TRACT
  Filled 2023-07-15 (×7): qty 3

## 2023-07-15 MED ORDER — FREE WATER
60.0000 mL | Status: DC
  Administered 2023-07-15 – 2023-07-17 (×13): 60 mL

## 2023-07-15 MED ORDER — METHYLPREDNISOLONE SODIUM SUCC 125 MG IJ SOLR
80.0000 mg | Freq: Two times a day (BID) | INTRAMUSCULAR | Status: DC
  Administered 2023-07-15: 80 mg via INTRAVENOUS
  Filled 2023-07-15: qty 2

## 2023-07-15 MED ORDER — PROSOURCE TF20 ENFIT COMPATIBL EN LIQD
60.0000 mL | Freq: Every day | ENTERAL | Status: DC
  Administered 2023-07-15 – 2023-07-17 (×3): 60 mL
  Filled 2023-07-15 (×3): qty 60

## 2023-07-15 MED ORDER — IPRATROPIUM BROMIDE 0.02 % IN SOLN: 0.5000 mg | Freq: Four times a day (QID) | RESPIRATORY_TRACT | Status: DC

## 2023-07-15 MED ORDER — ONDANSETRON HCL 4 MG/2ML IJ SOLN: 4.0000 mg | Freq: Four times a day (QID) | INTRAMUSCULAR | Status: DC | PRN

## 2023-07-15 MED ORDER — LEVALBUTEROL HCL 1.25 MG/0.5ML IN NEBU
1.2500 mg | INHALATION_SOLUTION | Freq: Four times a day (QID) | RESPIRATORY_TRACT | Status: DC
  Administered 2023-07-15: 1.25 mg via RESPIRATORY_TRACT
  Filled 2023-07-15: qty 0.5

## 2023-07-15 MED ORDER — ALBUTEROL SULFATE (2.5 MG/3ML) 0.083% IN NEBU: 2.5000 mg | INHALATION_SOLUTION | RESPIRATORY_TRACT | Status: DC | PRN

## 2023-07-15 MED ORDER — CHLORHEXIDINE GLUCONATE CLOTH 2 % EX PADS
6.0000 | MEDICATED_PAD | Freq: Every day | CUTANEOUS | Status: DC
Start: 2023-07-15 — End: 2023-07-17
  Administered 2023-07-16 – 2023-07-17 (×3): 6 via TOPICAL

## 2023-07-15 MED ORDER — IOHEXOL 350 MG/ML SOLN
75.0000 mL | Freq: Once | INTRAVENOUS | Status: AC | PRN
  Administered 2023-07-15: 75 mL via INTRAVENOUS

## 2023-07-15 MED ORDER — OXYCODONE HCL 5 MG/5ML PO SOLN: 5.0000 mg | Freq: Four times a day (QID) | ORAL | Status: DC | PRN

## 2023-07-15 MED ORDER — LEVALBUTEROL HCL 1.25 MG/0.5ML IN NEBU: 1.2500 mg | INHALATION_SOLUTION | RESPIRATORY_TRACT | Status: DC | PRN

## 2023-07-15 MED ORDER — NUTREN 1.5 EN LIQD: 2.0000 | Freq: Four times a day (QID) | ENTERAL | Status: DC

## 2023-07-15 MED ORDER — PANTOPRAZOLE SODIUM 40 MG PO TBEC: 40.0000 mg | DELAYED_RELEASE_TABLET | Freq: Every day | ORAL | Status: DC

## 2023-07-15 MED ORDER — SODIUM CHLORIDE 0.9% FLUSH: 10.0000 mL | INTRAVENOUS | Status: DC | PRN

## 2023-07-15 MED ORDER — ACETAMINOPHEN 650 MG RE SUPP: 650.0000 mg | Freq: Four times a day (QID) | RECTAL | Status: DC | PRN

## 2023-07-15 MED ORDER — LEVALBUTEROL HCL 1.25 MG/0.5ML IN NEBU: 1.2500 mg | INHALATION_SOLUTION | Freq: Four times a day (QID) | RESPIRATORY_TRACT | Status: DC

## 2023-07-15 MED ORDER — SODIUM CHLORIDE 0.9 % IV SOLN
2.0000 g | Freq: Three times a day (TID) | INTRAVENOUS | Status: DC
  Administered 2023-07-15 – 2023-07-17 (×7): 2 g via INTRAVENOUS
  Filled 2023-07-15 (×7): qty 12.5

## 2023-07-15 MED ORDER — ACETAMINOPHEN 325 MG PO TABS: 650.0000 mg | ORAL_TABLET | Freq: Four times a day (QID) | ORAL | Status: DC | PRN

## 2023-07-15 MED ORDER — INSULIN ASPART 100 UNIT/ML IJ SOLN
0.0000 [IU] | Freq: Three times a day (TID) | INTRAMUSCULAR | Status: DC
  Administered 2023-07-15: 7 [IU] via SUBCUTANEOUS
  Administered 2023-07-15: 15 [IU] via SUBCUTANEOUS
  Administered 2023-07-16: 7 [IU] via SUBCUTANEOUS
  Administered 2023-07-16: 11 [IU] via SUBCUTANEOUS
  Administered 2023-07-16: 7 [IU] via SUBCUTANEOUS
  Administered 2023-07-17: 20 [IU] via SUBCUTANEOUS
  Filled 2023-07-15: qty 0.2

## 2023-07-15 MED ORDER — INSULIN ASPART 100 UNIT/ML IJ SOLN
3.0000 [IU] | Freq: Once | INTRAMUSCULAR | Status: AC
  Administered 2023-07-16: 3 [IU] via SUBCUTANEOUS

## 2023-07-15 MED ORDER — IPRATROPIUM BROMIDE 0.02 % IN SOLN
0.5000 mg | Freq: Four times a day (QID) | RESPIRATORY_TRACT | Status: DC
  Administered 2023-07-15: 0.5 mg via RESPIRATORY_TRACT
  Filled 2023-07-15: qty 2.5

## 2023-07-15 MED ORDER — PREDNISONE 20 MG PO TABS
40.0000 mg | ORAL_TABLET | Freq: Every day | ORAL | Status: DC
  Administered 2023-07-16 – 2023-07-17 (×2): 40 mg
  Filled 2023-07-15 (×2): qty 2

## 2023-07-15 MED ORDER — OSMOLITE 1.5 CAL PO LIQD
474.0000 mL | Freq: Four times a day (QID) | ORAL | Status: DC
  Administered 2023-07-15 – 2023-07-17 (×8): 474 mL
  Filled 2023-07-15 (×10): qty 474

## 2023-07-15 NOTE — Progress Notes (Signed)
  Carryover admission to the Day Admitter.  I discussed this case with the EDP, Dr. Estelle June.  Per these discussions:   This is a 68 year old male with history of COPD, laryngeal cancer status post G-tube, who is being admitted with acute hypoxic respiratory failure, which is felt to be multifactorial in nature, including contributions from acute COPD exacerbation as well as from his known relative tracheal compression as a consequence of his underlying laryngeal cancer, after presenting with 1 day of shortness of breath.   EMS noted initial oxygen saturations to be in the 70s on room air, which have improved into the mid 90s on 4 L nasal cannula.   We will chest x-ray was initially concerning for infiltrate versus atelectasis, ensuing CTA chest is reported to show no evidence of acute process, including no evidence of acute pulmonary embolism or any evidence of infiltrate or edema.   In the setting of his known relative tracheal compression from laryngeal cancer, he is reported to be scheduled for lower endoscopy with dilation to occur with Dr. Pollyann Kennedy on 07/19/23.  I have placed an order for inpatient admission for further evaluation management of the above.  I have placed some additional preliminary admit orders via the adult multi-morbid admission order set. I have also ordered solumedrol, scheduled Xopenex/ipratropium nebulizers, prn Xopenex nebulizers.  As he receives his nutrition and PO meds via G-tube, I have ordered n.p.o. and have placed consult with the dietitian requesting assistance with resumption of home tube feeds and free water flushes.  I have also ordered morning labs in the form of CMP, CBC, magnesium and phosphorus levels.    Newton Pigg, DO Hospitalist

## 2023-07-15 NOTE — ED Provider Notes (Signed)
  Physical Exam  BP (!) 155/122   Pulse 96   Temp 98.8 F (37.1 C) (Oral)   Resp (!) 24   Ht 6' (1.829 m)   Wt 104.3 kg   SpO2 99%   BMI 31.19 kg/m   Physical Exam Vitals and nursing note reviewed.  HENT:     Head: Normocephalic and atraumatic.  Eyes:     Pupils: Pupils are equal, round, and reactive to light.  Cardiovascular:     Rate and Rhythm: Normal rate and regular rhythm.  Pulmonary:     Effort: Pulmonary effort is normal.     Breath sounds: Normal breath sounds.  Abdominal:     Palpations: Abdomen is soft.     Tenderness: There is no abdominal tenderness.  Skin:    General: Skin is warm and dry.  Neurological:     Mental Status: He is alert.  Psychiatric:        Mood and Affect: Mood normal.     Procedures  Procedures  ED Course / MDM   Clinical Course as of 07/15/23 0242  Sat Jul 15, 2023  0241 CTA of the chest reveals no evidence of pneumonia or pulmonary embolism.  Venous blood gas reveals slight respiratory acidosis.  Patient has remained stable on nasal cannula not requiring ventilator support at this time.  Suspect most likely etiology would be known laryngeal mass compressing airway along with superimposed COPD.  Discussed with admitting hospitalist accept patient for admission [MP]    Clinical Course User Index [MP] Royanne Foots, DO   Medical Decision Making I, Estelle June DO, have assumed care of this patient from the previous provider pending CTA chest, reevaluation and admission  Amount and/or Complexity of Data Reviewed Labs: ordered. Radiology: ordered.  Risk Prescription drug management. Decision regarding hospitalization.          Royanne Foots, DO 07/15/23 [phone]

## 2023-07-15 NOTE — Sepsis Progress Note (Addendum)
 Elink monitoring for the code sepsis protocol.  Blood cultures collected 2 hours prior to code sepsis order.  Antibiotics ordered with code sepsis.

## 2023-07-15 NOTE — Progress Notes (Signed)
 Initial Nutrition Assessment  DOCUMENTATION CODES:   Not applicable  INTERVENTION:  - TF regimen via G-tube as below: 2 cartons of Osmolite 1.5 @ 0600, 1200, 1800, 2330 (8 cartons/day) Prosource TF20 60 ml daily (will not need on discharge) + 30mL before and after each bolus +  recommend FWF TID Provides 2924 kcal, 139 gm protein, 2764 ml free water daily  - For discharge, would recommend switching back to formula Nutren 1.5 (continue 8 cartons/day)             - Patient to follow up with outpatient cancer RD after discharge.  NUTRITION DIAGNOSIS:   Increased nutrient needs related to cancer and cancer related treatments as evidenced by estimated needs.  GOAL:   Patient will meet greater than or equal to 90% of their needs  MONITOR:   Labs, Weight trends, TF tolerance  REASON FOR ASSESSMENT:   Consult Assessment of nutrition requirement/status, Enteral/tube feeding initiation and management  ASSESSMENT:   Pt admitted with acute hypoxic respiratory failure multifactorial d/t COPD exacerbation and relative tracheal compression d/t underlying laryngeal cancer. PMH significant for COPD, laryngeal cancer s/p G-tube.  Pt familiar to RD team from multiple prior and recent admissions.   He is also followed by outpatient RD at cancer center.   Pt currently remains in ED. Unable to discuss recent nutritional status.  MD ordered home regimen Nutren 1.5 2 cartons QID.  Will adjust to equivalent formula carried in hospital but pt to resume this regimen upon discharge.   Pt notable diagnosed with malnutrition during recent admission. Suspect this is ongoing. Will reassess upon follow up.  Pt's weight has been variable but appears to have significant decrease of 17.1%, within the last 5 months.   Medications: SSI 0-20 units TID, prednisone, IV abx, LR @ 191ml/hr  Labs:  Sodium 127 Potassium 5.5 BUN 26 CBG's 329, 354 x 6 hours  NUTRITION - FOCUSED PHYSICAL  EXAM: Deferred to follow up.   Diet Order:   Diet Order             Diet NPO time specified  Diet effective now                   EDUCATION NEEDS:   No education needs have been identified at this time  Skin:  Skin Assessment: Reviewed RN Assessment  Last BM:  unknown/PTA  Height:   Ht Readings from Last 1 Encounters:  07/14/23 6' (1.829 m)    Weight:   Wt Readings from Last 1 Encounters:  07/14/23 104.3 kg   BMI:  Body mass index is 31.19 kg/m.  Estimated Nutritional Needs:   Kcal:  2800-3000  Protein:  125-145g  Fluid:  >/=2L  Drusilla Kanner, RDN, LDN Clinical Nutrition

## 2023-07-15 NOTE — ED Notes (Signed)
 Patient transported to CT and returned to room.

## 2023-07-15 NOTE — H&P (Signed)
 History and Physical    Patient: Jesus [name] QIO:[id number] DOB: 1956/04/09 DOA: 07/14/2023 DOS: the patient was seen and examined on 07/15/2023 PCP: Noberto Retort, MD  Patient coming from: Home  Chief Complaint:  Chief Complaint  Patient presents with   Shortness of Breath   HPI: Jesus [name] is a 68 y.o. male with medical history significant of seasonal allergies, cocaine abuse, chemotherapy-induced anemia, non-Hodgkin's lymphoma, history of radiation therapy, cataracts, COPD, nephrolithiasis, hyperlipidemia, hypertension, irregular heartbeat, peripheral arterial disease, prediabetes, red-green color blindness, restless leg syndrome, class I obesity, history of laryngeal cancer, status post G-tube placement l who was admitted from 06/15/2023 until 06/24/2023 due to dysphagia secondary to laryngeal squamous cell carcinoma who presented to the emergency department yesterday evening via EMS with complaints of progressively worse dyspnea 2 hours prior to arrival.  The ambulance crew stated that he was 70% initially on room air.  He received Solu-Medrol 125 mg IVP, 2 g of magnesium sulfate and 1 nebulizer treatment.  He has been having increased production of secretions.  No fever, sore throat or rhinorrhea.  No chest pain, palpitations, lower extremity edema.  No nausea vomiting, constipation, melena or hematochezia.  No flank pain, dysuria, frequency or hematuria.  Lab work: Urinalysis showed greater than 500 glucose and protein of 30 mg/dL.  There was small leukocyte esterase.  Coronavirus, influenza and RSV PCR test was negative.  Venous blood gas showed a pCO2 of 65 mmHg and pO2 of 38 mmHg.  Bicarbonate was 31.3 and acid-base esses 2.8 mmol/L.  CBC showed a white count of 14.7 with 95% neutrophils, hemoglobin 8.5 g/dL platelets 324.  Normal PT, INR, lactic acid, phosphorus and magnesium level.  Troponin was 75 ng/L and BMP 108.6 pg/mL.  CMP showed a sodium 127, potassium 5.5, chloride 90 and  CO2 28 mmol/L.  Glucose 394, BUN 26, and creatinine 0.94 mg/dL.  Total protein is 8.4 and albumin 3.0 g/dL.  Calcium and the rest of the LFTs were normal.  Imaging: Portable 1 view chest radiograph showing mild right infra hilar atelectasis and/or infiltrate.  CTA chest with no evidence of pulmonary embolism or other acute intrathoracic process.  Stable findings consistent with the patient's known laryngeal cancer.   ED course: Initial vital signs were temperature 98.8 F, pulse 142, respiration 26, BP 187/108 mmHg O2 sat 96% on NRB mask.  The patient received 1000 mL of LR bolus, 500 mL of normal saline bolus, nitroglycerin patch, cefepime and vancomycin.  Review of Systems: As mentioned in the history of present illness. All other systems reviewed and are negative. 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- last radiation 06-2018   Cataract    removed both eyes with l;ens implants    COPD (chronic obstructive pulmonary disease) (HCC)    Dyspnea    Family history of colon cancer    in his brother- dx'd age 22    History of chemotherapy    last 06-01-2018   History of kidney stones    History of radiation therapy    last radiation 06-2018   Hyperlipidemia    currently under control   Hypertension    Irregular heart beats    Lymphadenopathy    Pain, lower  leg    Bilateral   Peripheral arterial disease (HCC)    Pre-diabetes    Red-green color blindness    RLS (restless legs syndrome)    Snores    Wears glasses    Past Surgical History:  Procedure Laterality Date   CATARACT EXTRACTION W/ INTRAOCULAR LENS  IMPLANT, BILATERAL     COLONOSCOPY     DIRECT LARYNGOSCOPY Right 02/03/2022   Procedure: DIRECT LARYNGOSCOPY WITH BIOPSY OF RIGHT FALSE VOCAL CORD;  Surgeon: Osborn Coho, MD;   Location: [facility] OR;  Service: ENT;  Laterality: Right;   dislodged salava stone     FRACTURE SURGERY     HAND ARTHROPLASTY  1995   crushed left hand   INGUINAL LYMPH NODE BIOPSY Left 01/02/2018   Procedure: LEFT INGUINAL LYMPH NODE BIOPSY;  Surgeon: Emelia Loron, MD;  Location: [facility] OR;  Service: General;  Laterality: Left;   IR IMAGING GUIDED PORT INSERTION  01/15/2018   IR IMAGING GUIDED PORT INSERTION  08/10/2022   IR REMOVAL TUN ACCESS W/ PORT W/O FL MOD SED  03/11/2019   IR REPLACE G-TUBE SIMPLE WO FLUORO  06/21/2023   LAPAROSCOPIC INSERTION GASTROSTOMY TUBE N/A 06/19/2023   Procedure: LAPAROSCOPIC INSERTION GASTROSTOMY TUBE;  Surgeon: Quentin Ore, MD;  Location: [facility] ORS;  Service: General;  Laterality: N/A;   LAPAROSCOPIC INSERTION GASTROSTOMY TUBE N/A 06/21/2023   Procedure: LAPAROSCOPIC INSERTION GASTROSTOMY TUBE;  Surgeon: Quentin Ore, MD;  Location: [facility] ORS;  Service: General;  Laterality: N/A;   MEDIASTINOSCOPY N/A 07/22/2022   Procedure: MEDIASTINOSCOPY;  Surgeon: Loreli Slot, MD;  Location: [facility] OR;  Service: Thoracic;  Laterality: N/A;   MICROLARYNGOSCOPY Left 01/17/2014   Procedure: MICROLARYNGOSCOPY WITH EXCISION OF THE BIOPSY OF LEFT VOCAL CORD LESION;  Surgeon: Serena Colonel, MD;  Location: [facility];  Service: ENT;  Laterality: Left;   MICROLARYNGOSCOPY N/A 09/16/2020   Procedure: MICROLARYNGOSCOPY with Biopsy of vocal cord lesion;  Surgeon: Osborn Coho, MD;  Location: [facility] OR;  Service: ENT;  Laterality: N/A;   ORIF FOOT FRACTURE  2005   left   REFRACTIVE SURGERY Right    removed cloudiness in right eye after cataract removal    Social History:  reports that he quit smoking about 2 years ago. His smoking use included cigarettes. He started smoking about 38 years ago. He has a 18 pack-year smoking history. He has never used smokeless tobacco. He reports that he does not currently use alcohol after a past usage of about 6.0 standard drinks of  alcohol per week. He reports current drug use. Drugs: , Marijuana, and Cocaine.  Allergies  Allergen Reactions   Bee Venom Anaphylaxis   Lisinopril Other (See Comments)    Swelling in face   Antifungal [Miconazole Nitrate] Other (See Comments)     made his head hurt   Zolpidem Tartrate Er Other (See Comments)    Leg cramps    Family History  Problem Relation Age of Onset   Breast cancer Mother    Diabetes Father    Hypertension Father    Stroke Father    Mental illness Sister    Hypertension Daughter    Mental illness Daughter    Hypertension Brother    Colon cancer Brother 12       passed away November 30, 2018   Breast cancer Sister    Esophageal cancer Neg Hx    Colon polyps Neg Hx    Rectal cancer Neg Hx    Stomach cancer Neg Hx  Prior to Admission medications   Medication Sig Start Date End Date Taking? Authorizing Provider  acetaminophen (TYLENOL) 160 MG/5ML solution Place 20.3 mLs (650 mg total) into feeding tube every 6 (six) hours as needed for mild pain (pain score 1-3). 06/20/23  Yes Maczis, Elmer Sow, PA-C  albuterol (VENTOLIN HFA) 108 (90 Base) MCG/ACT inhaler Inhale 2 puffs into the lungs every 6 (six) hours as needed for wheezing or shortness of breath. 07/05/23  Yes Marcelino Duster, MD  glycopyrrolate (ROBINUL) 1 MG tablet Take 1 tablet (1 mg total) by mouth every 4 (four) hours as needed (excessive secretions). Patient taking differently: Place 1 mg into feeding tube every other day. Crush tablet, place in water, and then place in feeding tube. 06/24/23  Yes [place], Alvira Philips, DO  lidocaine-prilocaine (EMLA) cream Apply to affected area once 08/04/22  Yes Johney Maine, MD  Nutritional Supplements (NUTREN 1.5) LIQD Give 2 cartons 4 times a day.  Flush with 30ml of water before and after each feeding.  Give additional of water TID for additional hydration. 06/26/23  Yes Johney Maine, MD  oxyCODONE (ROXICODONE) 5 MG/5ML solution Place 5 mLs (5 mg  total) into feeding tube every 6 (six) hours as needed for breakthrough pain. 06/20/23  Yes Maczis, Elmer Sow, PA-C  pantoprazole (PROTONIX) 40 MG tablet Take 1 tablet (40 mg total) by mouth daily. 07/05/23  Yes Marcelino Duster, MD  Protein (FEEDING SUPPLEMENT, PROSOURCE TF20,) liquid Place 60 mLs into feeding tube daily. 07/06/23  Yes Marcelino Duster, MD  Water For Irrigation, Sterile (FREE WATER) SOLN Place 125 mLs into feeding tube every 4 (four) hours. Patient taking differently: Place 60 mLs into feeding tube every 4 (four) hours. Use 30ml prior to feeding supplement, and 30ml after each feeding supplement 06/24/23 09/22/23 Yes [place], Eric J, [place]    Physical Exam: Vitals:   07/15/23 1232 07/15/23 1330 07/15/23 1400 07/15/23 1650  BP:  (!) 147/97 138/88 107/85  Pulse:  (!) 105 (!) 102 98  Resp:  17 (!) 21 18  Temp: (!) 97.4 F (36.3 C)   97.9 F (36.6 C)  TempSrc: Oral   Oral  SpO2:  100% 100% 98%  Weight:    101.1 kg  Height:    6' (1.829 m)   Physical Exam Vitals and nursing note reviewed.  Constitutional:      General: He is awake. He is not in acute distress.    Appearance: He is well-developed. He is obese. He is ill-appearing.     Interventions: Nasal cannula in place.  HENT:     Head: Normocephalic.     Nose: No rhinorrhea.     Mouth/Throat:     Mouth: Mucous membranes are moist.  Eyes:     General: No scleral icterus.    Pupils: Pupils are equal, round, and reactive to light.  Neck:     Vascular: No JVD.  Cardiovascular:     Rate and Rhythm: Normal rate and regular rhythm.     Heart sounds: S1 normal and S2 normal.  Pulmonary:     Effort: No tachypnea or accessory muscle usage.     Breath sounds: Wheezing and rhonchi present. No rales.  Abdominal:     General: The ostomy site is clean. Bowel sounds are normal. There is no distension.     Palpations: Abdomen is soft.     Tenderness: There is no abdominal tenderness. There is no right CVA tenderness,  left CVA tenderness or guarding.  Musculoskeletal:     Cervical back: Neck supple.     Right lower leg: No edema.     Left lower leg: No edema.  Skin:    General: Skin is warm and dry.  Neurological:     General: No focal deficit present.     Mental Status: He is alert and oriented to person, place, and time.  Psychiatric:        Mood and Affect: Mood normal.        Behavior: Behavior normal. Behavior is cooperative.     Data Reviewed:  Results are pending, will review when available.  07/03/2023 echocardiogram report. IMPRESSIONS:   1. Left ventricular ejection fraction, by estimation, is 65 to 70%. The  left ventricle has normal function. The left ventricle has no regional  wall motion abnormalities. There is mild concentric left ventricular  hypertrophy. Left ventricular diastolic  parameters are consistent with Grade I diastolic dysfunction (impaired  relaxation).   2. Right ventricular systolic function is normal. The right ventricular  size is normal.   3. The mitral valve is normal in structure. No evidence of mitral valve  regurgitation. No evidence of mitral stenosis.   4. The aortic valve is normal in structure. Aortic valve regurgitation is  not visualized. No aortic stenosis is present.   5. The inferior vena cava is normal in size with greater than 50%  respiratory variability, suggesting right atrial pressure of 3 mmHg.   EKG: Vent. 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 Continue supplemental oxygen. Scheduled and as needed bronchodilators. Continue  cefepime 2 g IVPB every 8 hours. Check strep pneumoniae urinary antigen. Check sputum Gram stain, culture and sensitivity. Recheck procalcitonin level in AM. Follow-up blood culture and sensitivity. Follow-up CBC and chemistry in the morning.  Active Problems:   Grade I diastolic dysfunction  Looks volume depleted. May benefit from low-dose ACE/beta-blocker. Follow-up with primary care provider and/or cardiology as an outpatient.    HTN (hypertension) Not on antihypertensives. Monitor blood pressure closely. Oral or parenteral antihypertensives as needed.    Mixed hyperlipidemia   Aortic atherosclerosis (HCC)  Currently not on medical therapy. Follow-up with primary care provider.    Type 2 diabetes mellitus with hyperglycemia (HCC) Continue PEG feeding. Receiving glucocorticoids. CBG monitoring with RI SS. Check hemoglobin A1c.    Mild protein-calorie malnutrition (HCC) Resume PEG feeding.    Thrombocytosis Reactive to anemia. Monitor platelet count.    Normocytic anemia Monitor hematocrit and hemoglobin.    Hyperkalemia Continue IV fluids, RI SS and neb treatments.     Advance Care Planning:   Code Status: Full Code   Consults:   Family Communication:   Severity of Illness: The appropriate patient status for this patient is INPATIENT. Inpatient status is judged to be reasonable and necessary in order to provide the required intensity of service to ensure the patient's safety. The patient's presenting symptoms, physical exam findings, and initial radiographic and laboratory data in the context of their chronic comorbidities is felt to place them at high risk for further clinical deterioration. Furthermore, it is not anticipated that the  patient will be medically stable for discharge from the hospital within 2 midnights of admission.   * I certify that at the point of admission it is my clinical judgment that the patient will require inpatient hospital care  spanning beyond 2 midnights from the point of admission due to high intensity of service, high risk for further deterioration and high frequency of surveillance required.*  Author: Bobette Mo, MD 07/15/2023 5:31 PM  For on call review [email or web address].   This document was prepared using Dragon voice recognition software and may contain some unintended transcription errors.

## 2023-07-16 DIAGNOSIS — J9601 Acute respiratory failure with hypoxia: Secondary | ICD-10-CM | POA: Diagnosis not present

## 2023-07-16 LAB — BASIC METABOLIC PANEL
Anion gap: 8 (ref 5–15)
BUN: 44 mg/dL — ABNORMAL HIGH (ref 8–23)
CO2: 30 mmol/L (ref 22–32)
Calcium: 9.2 mg/dL (ref 8.9–10.3)
Chloride: 94 mmol/L — ABNORMAL LOW (ref 98–111)
Creatinine, Ser: 1.02 mg/dL (ref 0.61–1.24)
GFR, Estimated: >60 mL/min (ref >60)
Glucose, Bld: 271 mg/dL — ABNORMAL HIGH (ref 70–99)
Potassium: 4.6 mmol/L (ref 3.5–5.1)
Sodium: 132 mmol/L — ABNORMAL LOW (ref 135–145)

## 2023-07-16 LAB — CBC WITH DIFFERENTIAL/PLATELET
Abs Immature Granulocytes: 0.08 10*3/uL — ABNORMAL HIGH (ref 0.00–0.07)
Basophils Absolute: 0 10*3/uL (ref 0.0–0.1)
Basophils Relative: 0 %
Eosinophils Absolute: 0 10*3/uL (ref 0.0–0.5)
Eosinophils Relative: 0 %
HCT: 26.6 % — ABNORMAL LOW (ref 39.0–52.0)
Hemoglobin: 7.8 g/dL — ABNORMAL LOW (ref 13.0–17.0)
Immature Granulocytes: 1 %
Lymphocytes Relative: 16 %
Lymphs Abs: 2.2 10*3/uL (ref 0.7–4.0)
MCH: 24.5 pg — ABNORMAL LOW (ref 26.0–34.0)
MCHC: 29.3 g/dL — ABNORMAL LOW (ref 30.0–36.0)
MCV: 83.4 fL (ref 80.0–100.0)
Monocytes Absolute: 0.8 10*3/uL (ref 0.1–1.0)
Monocytes Relative: 6 %
Neutro Abs: 10.8 10*3/uL — ABNORMAL HIGH (ref 1.7–7.7)
Neutrophils Relative %: 77 %
Platelets: 509 10*3/uL — ABNORMAL HIGH (ref 150–400)
RBC: 3.19 MIL/uL — ABNORMAL LOW (ref 4.22–5.81)
RDW: 15.3 % (ref 11.5–15.5)
WBC: 14 10*3/uL — ABNORMAL HIGH (ref 4.0–10.5)
nRBC: 0 % (ref 0.0–0.2)

## 2023-07-16 LAB — GLUCOSE, CAPILLARY
Glucose-Capillary: 202 mg/dL — ABNORMAL HIGH (ref 70–99)
Glucose-Capillary: 209 mg/dL — ABNORMAL HIGH (ref 70–99)
Glucose-Capillary: 217 mg/dL — ABNORMAL HIGH (ref 70–99)
Glucose-Capillary: 240 mg/dL — ABNORMAL HIGH (ref 70–99)
Glucose-Capillary: 300 mg/dL — ABNORMAL HIGH (ref 70–99)
Glucose-Capillary: 394 mg/dL — ABNORMAL HIGH (ref 70–99)

## 2023-07-16 LAB — PROCALCITONIN: Procalcitonin: 0.42 ng/mL

## 2023-07-16 MED ORDER — ORAL CARE MOUTH RINSE: 15.0000 mL | OROMUCOSAL | Status: DC | PRN

## 2023-07-16 MED ORDER — ORAL CARE MOUTH RINSE
15.0000 mL | OROMUCOSAL | Status: DC
  Administered 2023-07-16 – 2023-07-17 (×5): 15 mL via OROMUCOSAL

## 2023-07-16 NOTE — Hospital Course (Signed)
 68 yo M with NHL, hx radiation therapy, COPD, not on home O2, and laryngeal CA s/p G tube who presented with dyspnea.

## 2023-07-16 NOTE — Evaluation (Signed)
 Occupational Therapy Evaluation Patient Details Name: Jesus [name] MRN: [id number] DOB: 01-17-1956 Today's Date: 07/16/2023   History of Present Illness   68 year old male who was recently admitted from 06/15/2023 until 06/24/2023 due to dysphagia secondary to laryngeal squamous cell carcinoma and who presented to the emergency department on 07/14/23 via EMS with complaints of progressively worse dyspnea 2 hours prior to arrival.  The ambulance crew stated that he was 70% initially on room air. Past medical history significant of seasonal allergies, cocaine abuse, chemotherapy-induced anemia, non-Hodgkin's lymphoma, history of radiation therapy, cataracts, COPD, nephrolithiasis, hyperlipidemia, hypertension, irregular heartbeat, peripheral arterial disease, prediabetes, red-green color blindness, restless leg syndrome, class I obesity, history of laryngeal cancer, status post G-tube placement     Clinical Impressions Patient evaluated by Occupational Therapy with no further acute OT needs identified. All education has been completed and the patient has no further questions. Energy conservation handouts provided and discussed.  See below for any follow-up Occupational Therapy or equipment needs. OT is signing off. Thank you for this referral.      If plan is discharge home, recommend the following:   Assistance with cooking/housework     Functional Status Assessment   Patient has had a recent decline in their functional status and demonstrates the ability to make significant improvements in function in a reasonable and predictable amount of time.     Equipment Recommendations   Tub/shower seat     Recommendations for Other Services         Precautions/Restrictions   Precautions Precautions: None Restrictions Weight Bearing Restrictions Per Provider Order: No     Mobility Bed Mobility               General bed mobility comments: Sitting EOB. Declined return to  supine.    Transfers Overall transfer level: Modified independent                        Balance Overall balance assessment: Independent, Modified Independent (Holding IV pole)                                         ADL either performed or assessed with clinical judgement   ADL Overall ADL's : At baseline                                       General ADL Comments: Pt able to demonstrate LE dressing, toileting, transfers and grooming while standing at sink at a Mod I to Independent level. Pt expressed goal to increase endurance therefore pt provided with handouts and education on energy conservation, pursed lip breathing, use of shower chair and long handlede shower brush as example. Reviewd the "4 P's" and pt verbalized understanding to all.     Vision   Vision Assessment?: No apparent visual deficits     Perception         Praxis         Pertinent Vitals/Pain Pain Assessment Pain Assessment: 0-10 Pain Score: 3  Pain Location: Abdomen at PEG site Pain Descriptors / Indicators: Sore Pain Intervention(s): Monitored during session, Limited activity within patient's tolerance     Extremity/Trunk Assessment Upper Extremity Assessment Upper Extremity Assessment: Overall WFL for tasks assessed   Lower Extremity Assessment Lower Extremity Assessment:  Overall [facility] for tasks assessed   Cervical / Trunk Assessment Cervical / Trunk Assessment: Normal   Communication Communication Communication: Other (comment) (Increased effort with low gravel voice)   Cognition Arousal: Alert Behavior During Therapy: WFL for tasks assessed/performed Cognition: No apparent impairments                               Following commands: Intact       Cueing  General Comments          Exercises     Shoulder Instructions      Home Living Family/patient expects to be discharged to:: Private residence Living Arrangements:  Children;Other relatives Available Help at Discharge: Available PRN/intermittently Type of Home: House Home Access: Stairs to enter Entrance Stairs-Number of Steps: 6 Entrance Stairs-Rails: Right;Left Home Layout: Two level;Full bath on main level;Able to live on main level with bedroom/bathroom     Bathroom Shower/Tub: Chief Strategy Officer: Handicapped height     Home Equipment: Hand held shower head          Prior Functioning/Environment Prior Level of Function : Independent/Modified Independent;Driving;History of Falls (last six months)             Mobility Comments: 1 fall in shower in past 6 month-slipped on soap      OT Problem List: Decreased activity tolerance   OT Treatment/Interventions:        OT Goals(Current goals can be found in the care plan section)   Acute Rehab OT Goals Patient Stated Goal: Increase endurance. OT Goal Formulation: All assessment and education complete, DC therapy ADL Goals Additional ADL Goal #1: Patient will identify at least 3 energy conservation strategies to employ at home in order to maximize function and quality of life and decrease caregiver burden while preventing exacerbation of symptoms and rehospitalization.   OT Frequency:       Co-evaluation              AM-PAC OT "6 Clicks" Daily Activity     Outcome Measure Help from another person eating meals?: None (NPO-PEG) Help from another person taking care of personal grooming?: None Help from another person toileting, which includes using toliet, bedpan, or urinal?: None Help from another person bathing (including washing, rinsing, drying)?: A Little Help from another person to put on and taking off regular upper body clothing?: None Help from another person to put on and taking off regular lower body clothing?: None 6 Click Score: 23   End of Session Equipment Utilized During Treatment:  (IV pole) Nurse Communication: Other (comment) (Nursing  student in room for most of OT Evaluation.)  Activity Tolerance: Patient tolerated treatment well Patient left: with call bell/phone within reach (EOB, Nursing aware)  OT Visit Diagnosis: History of falling (Z91.81) (decreased IADLs)                Time: 1478-2956 OT Time Calculation (min): 26 min Charges:  OT General Charges $OT Visit: 1 Visit OT Evaluation $OT Eval Low Complexity: 1 Low OT Treatments $Self Care/Home Management : 8-22 mins  Victorino Dike, OT Acute Rehab Services Office: [phone] 07/16/2023   Theodoro Clock 07/16/2023, 9:56 AM

## 2023-07-16 NOTE — Progress Notes (Signed)
  Progress Note   Patient: Jesus [name] YQM:[id number] DOB: [birth year] DOA: 07/14/2023     1 DOS: the patient was seen and examined on 07/16/2023        Brief hospital course: 68 yo M with NHL, hx radiation therapy, COPD, not on home O2, and laryngeal CA s/p G tube who presented with dyspnea.     Assessment and Plan: COPD exacerbation exacerbated by stridor - Continue steroids, antibiotics - Continue albuterol   Larnygeal CA For procedure on Wednesday - Continue tube feeds and free water  Hyponatremia Better with fluids  Anemia Relatively stable, no bleeding reported  Diabetes with hyperglycemia - Continue SS corrections         Subjective: Feeling a little better, still SOB     Physical Exam: BP (!) 128/91 (BP Location: Left Arm)   Pulse 98   Temp 98.5 F (36.9 C) (Oral)   Resp 20   Ht 6' (1.829 m)   Wt 100.3 kg   SpO2 100%   BMI 30.00 kg/m   Thin adult male Tachycardic, regular, no LE edema RR normal, stridor noted, no distress, lungs clear without rales or wheezes Abdomen soft, no TTP Attention normal, affect normal, strength normal, gait normal   Data Reviewed: Basic metabolic panel shows mild hyponatremia, hypokalemia, normal renal function CBC shows leukocytosis from steroids, anemia  Family Communication:      Disposition: Status is: Inpatient         Author: Alberteen Sam, MD 07/16/2023 5:43 PM  For on call review [email or web address].

## 2023-07-16 NOTE — Evaluation (Signed)
 Physical Therapy Brief Evaluation and Discharge Note Patient Details Name: RODMAN RECUPERO MRN: [id number] DOB: 1956-04-19 Today's Date: 07/16/2023   History of Present Illness  68 yo male admitted with acute hypoxic respiratory failure, COPD exac. Hx of met squamous cell Ca-throat, drug abuse, PAD, RLS, obesity, G tube placement, COPD, NHL, chemo induced anemia  Clinical Impression  On eval, pt was Mod Ind with mobility. O2 99% on RA, HR 114 bpm, dyspnea 2/4 with ambulation. No acute PT needs. 1x eval       PT Assessment    Assistance Needed at Discharge       Equipment Recommendations None recommended by PT  Recommendations for Other Services       Precautions/Restrictions Precautions Precautions: None Restrictions Weight Bearing Restrictions Per Provider Order: No        Mobility  Bed Mobility          Transfers Overall transfer level: Modified independent                      Ambulation/Gait Ambulation/Gait assistance: Modified independent (Device/Increase time) Gait Distance (Feet): 250 Feet Assistive device: IV Pole Gait Pattern/deviations: Step-through pattern   General Gait Details: Good gait speed. No LOB but 1 stumble. O2 99% on RA, HR 114 bpm, dyspnea 2/4  Home Activity Instructions    Stairs            Modified Rankin (Stroke Patients Only)        Balance                          Pertinent Vitals/Pain   Pain Assessment Pain Assessment: No/denies pain     Home Living   Living Arrangements: Children;Other relatives       Home Equipment: Hand held shower head        Prior Function        UE/LE Assessment               Communication   Communication Communication: No apparent difficulties     Cognition         General Comments      Exercises     Assessment/Plan    PT Problem List         PT Visit Diagnosis      No Skilled PT Patient is modified independent with all  activity/mobility   Co-evaluation                AMPAC 6 Clicks Help needed turning from your back to your side while in a flat bed without using bedrails?: None Help needed moving from lying on your back to sitting on the side of a flat bed without using bedrails?: None Help needed moving to and from a bed to a chair (including a wheelchair)?: None Help needed standing up from a chair using your arms (e.g., wheelchair or bedside chair)?: None Help needed to walk in hospital room?: None Help needed climbing 3-5 steps with a railing? : None 6 Click Score: 24      End of Session Equipment Utilized During Treatment: Gait belt Activity Tolerance: Patient tolerated treatment well Patient left: in bed;with call bell/phone within reach         Time: 1500-1508 PT Time Calculation (min) (ACUTE ONLY): 8 min  Charges:   PT Evaluation $PT Eval Low Complexity: 1 Low          Taevion Sikora P, PT Acute Rehabilitation  Office: [phone]

## 2023-07-17 ENCOUNTER — Ambulatory Visit: Payer: Medicare HMO | Admitting: Hematology

## 2023-07-17 ENCOUNTER — Other Ambulatory Visit: Payer: Medicare HMO

## 2023-07-17 ENCOUNTER — Other Ambulatory Visit (HOSPITAL_COMMUNITY): Payer: Self-pay

## 2023-07-17 ENCOUNTER — Encounter: Payer: Self-pay | Admitting: Hematology

## 2023-07-17 ENCOUNTER — Ambulatory Visit: Payer: Medicare HMO

## 2023-07-17 DIAGNOSIS — J9601 Acute respiratory failure with hypoxia: Secondary | ICD-10-CM | POA: Diagnosis not present

## 2023-07-17 LAB — CBC
HCT: 26 % — ABNORMAL LOW (ref 39.0–52.0)
Hemoglobin: 7.4 g/dL — ABNORMAL LOW (ref 13.0–17.0)
MCH: 23.8 pg — ABNORMAL LOW (ref 26.0–34.0)
MCHC: 28.5 g/dL — ABNORMAL LOW (ref 30.0–36.0)
MCV: 83.6 fL (ref 80.0–100.0)
Platelets: 479 10*3/uL — ABNORMAL HIGH (ref 150–400)
RBC: 3.11 MIL/uL — ABNORMAL LOW (ref 4.22–5.81)
RDW: 15.6 % — ABNORMAL HIGH (ref 11.5–15.5)
WBC: 13.4 10*3/uL — ABNORMAL HIGH (ref 4.0–10.5)
nRBC: 0 % (ref 0.0–0.2)

## 2023-07-17 LAB — BASIC METABOLIC PANEL
Anion gap: 9 (ref 5–15)
BUN: 42 mg/dL — ABNORMAL HIGH (ref 8–23)
CO2: 31 mmol/L (ref 22–32)
Calcium: 9 mg/dL (ref 8.9–10.3)
Chloride: 93 mmol/L — ABNORMAL LOW (ref 98–111)
Creatinine, Ser: 0.93 mg/dL (ref 0.61–1.24)
GFR, Estimated: >60 mL/min (ref >60)
Glucose, Bld: 395 mg/dL — ABNORMAL HIGH (ref 70–99)
Potassium: 4.8 mmol/L (ref 3.5–5.1)
Sodium: 133 mmol/L — ABNORMAL LOW (ref 135–145)

## 2023-07-17 LAB — GLUCOSE, CAPILLARY
Glucose-Capillary: 357 mg/dL — ABNORMAL HIGH (ref 70–99)
Glucose-Capillary: 111 mg/dL — ABNORMAL HIGH (ref 70–99)

## 2023-07-17 MED ORDER — GLYCOPYRROLATE 1 MG PO TABS: 1.0000 mg | ORAL_TABLET | ORAL | Status: DC

## 2023-07-17 MED ORDER — IPRATROPIUM-ALBUTEROL 0.5-2.5 (3) MG/3ML IN SOLN
3.0000 mL | Freq: Three times a day (TID) | RESPIRATORY_TRACT | Status: DC
Start: 2023-07-17 — End: 2023-07-17
  Administered 2023-07-17: 3 mL via RESPIRATORY_TRACT
  Filled 2023-07-17: qty 3

## 2023-07-17 MED ORDER — FERROUS SULFATE 220 (44 FE) MG/5ML PO SOLN
300.0000 mg | ORAL | 0 refills | Status: DC
  Filled 2023-07-17: qty 106, 30d supply, fill #0

## 2023-07-17 MED ORDER — HEPARIN SOD (PORK) LOCK FLUSH 100 UNIT/ML IV SOLN
500.0000 [IU] | INTRAVENOUS | Status: AC | PRN
  Administered 2023-07-17: 500 [IU]

## 2023-07-17 MED ORDER — AMOXICILLIN-POT CLAVULANATE 400-57 MG/5ML PO SUSR
800.0000 mg | Freq: Two times a day (BID) | ORAL | 0 refills | Status: AC
  Filled 2023-07-17: qty 100, 5d supply, fill #0

## 2023-07-17 MED ORDER — PREDNISONE 20 MG PO TABS
40.0000 mg | ORAL_TABLET | Freq: Every day | ORAL | 0 refills | Status: AC
  Filled 2023-07-17: qty 6, 3d supply, fill #0

## 2023-07-17 NOTE — Progress Notes (Signed)
 ENT  Patient well-known to me.  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  He is being monitored and treated now for distant metastatic disease.  He has had 2 admissions recently for breathing difficulty.  I was concerned about severe stridor.  On exam today he is breathing well and moving air without much noise.  I had him take several heart breaths in a row and still has minimal upper airway noise.  I examined his larynx a few weeks ago in the office and there was supraglottic edema but no obvious obstruction.  He does have a fixed cord on 1 side and slight paresis on the other side.  He was post to be scheduled for esophagoscopy and dilatation this coming Wednesday at [facility].  He is postgastrostomy tube where he underwent general anesthesia in February without any difficulty with intubation.  Based on his exam right now I am not overly concerned about severe stridor and not overly concerned that we need to consider tracheostomy.  There is evidence of an infiltrate on chest x-ray and I think his problem is more pulmonic.  Continue with medical treatment for that.  As long as he is well enough for the procedure Wednesday we can proceed.  We did talk about the possibility need for tracheostomy if we have difficulty with his airway during the procedure or if we have difficulty extubating him.

## 2023-07-17 NOTE — TOC CM/SW Note (Signed)
 Transition of Care [facility]) - Inpatient Brief Assessment   Patient Details  Name: Jesus [name] MRN: [id number] Date of Birth: 26-Feb-1956  [facility] of [facility]) CM/SW Contact:    Larrie Kass, LCSW Phone Number: 07/17/2023, 10:26 AM   Clinical Narrative:  Pt has PCP and no SDOH concerns.   Transition of Care Asessment: Insurance and Status: Insurance coverage has been reviewed Patient has primary care physician: Yes Home environment has been reviewed: home with self Prior level of function:: mod-independent Prior/Current Home Services: No current home services Social Drivers of Health Review: SDOH reviewed no interventions necessary Readmission risk has been reviewed: Yes Transition of care needs: no transition of care needs at this time

## 2023-07-17 NOTE — Progress Notes (Signed)
 AVS reviewed w/ pt who verbalized an understanding. No other questions at this time. Pt is waiting on his ride to arrive - p[er [place], his daughter is bringing him clothes for d/c to home.

## 2023-07-17 NOTE — Progress Notes (Signed)
 TOC discharge meds delivered in a secure bag to pt in room by this RN- Pt  verbalized an understanding that augmentin needed to be kept in the fridge.

## 2023-07-17 NOTE — Inpatient Diabetes Management (Signed)
 Inpatient Diabetes Program Recommendations  AACE/ADA: New Consensus Statement on Inpatient Glycemic Control (2015)  Target Ranges:  Prepandial:   less than 140 mg/dL      Peak postprandial:   less than 180 mg/dL (1-2 hours)      Critically ill patients:  140 - 180 mg/dL   Lab Results  Component Value Date   GLUCAP 357 (H) 07/17/2023   HGBA1C 7.2 (H) 07/05/2023    Review of Glycemic Control  Latest Reference Range & Units 07/16/23 08:19 07/16/23 12:04 07/16/23 18:12 07/16/23 20:49 07/17/23 08:11  Glucose-Capillary 70 - 99 mg/dL 161 (H) 096 (H) 045 (H) 394 (H) 357 (H)  (H): Data is abnormally high  Diabetes history: DM2 Outpatient Diabetes medications: none noted Current orders for Inpatient glycemic control: Novolog 0-20 TID and Prednisone 40 mg every day, bolus feeds per GT  Inpatient Diabetes Program Recommendations:    Might consider:  Semglee 20 units every day Novolog 0-20 units TID and HS  Will continue to follow while inpatient.  Thank you, Dulce Sellar, MSN, CDCES Diabetes Coordinator Inpatient Diabetes Program [phone] (team pager from 8a-5p)

## 2023-07-17 NOTE — Discharge Summary (Signed)
 Physician Discharge Summary   Patient: Jesus [name] MRN: [id number] DOB: [birth year]  Admit date:     07/14/2023  Discharge date: 07/17/23  Discharge Physician: Alberteen Sam   PCP: Noberto Retort, MD     Recommendations at discharge:  Follow up with Dr. 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    Dyspnea due to tracheal narrowing, anemia and underlying COPD Patient presented with dyspnea.  There was hearsay of hypoxia, but none documented.    His lung exam was clear.  CTA chest ruled out PE, showed no pneumonia or other infiltrates or airspace opacities.  Evidently he was wheezing on admission, so placed on steroids and antibiotics and admitted for COPD flare.  Evaluated by ENT here, who felt he had no stridor nor tracheal narrowing.  I defer to their expert judgment, they plan for esophagoscopy on Wednesday of this week.  Discharged on steroids, antibiotics.          The [facility] Controlled Substances Registry was reviewed for this patient prior to discharge.  Consultants: ENT Procedures performed: CTA chest  Disposition: Home   DISCHARGE MEDICATION: Allergies as of 07/17/2023       Reactions   Bee Venom Anaphylaxis   Lisinopril Other (See Comments)   Swelling in face   Antifungal [miconazole Nitrate] Other (See Comments)    made his head hurt   Zolpidem  Tartrate Er Other (See Comments)   Leg cramps        Medication List     TAKE these medications    acetaminophen 160 MG/5ML solution Commonly known as: TYLENOL Place 20.3 mLs (650 mg total) into feeding tube every 6 (six) hours as needed for mild pain (pain score 1-3).   albuterol 108 (90 Base) MCG/ACT inhaler Commonly known as: VENTOLIN HFA Inhale 2 puffs into the lungs every 6 (six) hours as needed for wheezing or shortness of breath.   amoxicillin-clavulanate 400-57 MG/5ML suspension Commonly known as: AUGMENTIN Place 10 mLs (800 mg total) into feeding tube 2 (two) times daily for 5 days.   feeding supplement (PROSource TF20) liquid Place 60 mLs into feeding tube daily.   ferrous sulfate 220 (44 Fe) MG/5ML solution Place 6.8 mLs (300 mg total) into feeding tube every other day.   free water Soln Place 125 mLs into feeding tube every 4 (four) hours. What changed:  how much to take additional instructions   glycopyrrolate 1 MG tablet Commonly known as: ROBINUL Place 1 tablet (1 mg total) into feeding tube every other day. Crush tablet, place in water, and then place in feeding tube.   lidocaine-prilocaine cream Commonly known as: EMLA Apply to affected area once   Nutren 1.5 Liqd Give 2 cartons 4 times a day.  Flush with 30ml of water before and after each feeding.  Give additional of water TID for additional hydration.   oxyCODONE  5 MG/5ML solution Commonly known as: ROXICODONE Place 5 mLs (5 mg total) into feeding tube every 6 (six) hours as needed for breakthrough pain.   pantoprazole 40 MG tablet Commonly known as: Protonix Take 1 tablet (40 mg total) by mouth daily.   predniSONE 20 MG tablet Commonly known as: DELTASONE Place 2 tablets (40 mg total) into feeding tube daily with breakfast for 3 days. Crush tablet, place in water, and then place in feeding tube.        Follow-up Information     Noberto Retort, MD Follow up.   Specialty:  Family Medicine Contact information: [phone] [address] [place] [address] [phone]                 Discharge Instructions     Discharge instructions   Complete by: As directed    **IMPORTANT DISCHARGE INSTRUCTIONS**   From Dr. Maryfrances Bunnell: You were admitted for trouble breathing.  Here, we did chest imaging that showed no signs of infection or blood clots.  Your lungs sounded a little wheezy at first, and so we started you on antibiotics and steroids for a possible flare of chronic lung disease  Doreatha Martin this with a few more days of antibiotics and a few more days of steroids  Post-op (after surgery Wednesday), they can resume these or stop them at their discretion  To help with your anemia, take an iron supplement, every other day.   Increase activity slowly   Complete by: As directed        Discharge Exam: Filed Weights   07/15/23 1650 07/16/23 0619 07/17/23 0500  Weight: 101.1 kg 100.3 kg 103.7 kg    General: Pt is alert, awake, not in acute distress Cardiovascular: RRR, nl S1-S2, no murmurs appreciated.   No LE edema.   Respiratory: Normal respiratory rate and rhythm.  CTAB without rales or wheezes. Abdominal: Abdomen soft and non-tender.  No distension or HSM.   Neuro/Psych: Strength symmetric in upper and lower extremities.  Judgment and insight appear normal.   Condition at discharge: fair  The results of significant diagnostics from this hospitalization (including imaging, microbiology, ancillary and laboratory) are listed below for reference.   Imaging Studies: CT Angio Chest PE W and/or Wo Contrast Result Date: 07/15/2023 CLINICAL DATA:  Multiple suspected pulmonary embolism. EXAM: CT ANGIOGRAPHY CHEST WITH CONTRAST TECHNIQUE: Multidetector CT imaging of the chest was performed using the standard protocol during bolus administration of intravenous contrast. Multiplanar CT image reconstructions and MIPs were obtained to evaluate the  vascular anatomy. RADIATION DOSE REDUCTION: This exam was performed according to the departmental dose-optimization program which includes automated exposure control, adjustment of the mA and/or kV according to patient size and/or use of iterative reconstruction technique. CONTRAST:  75mL OMNIPAQUE IOHEXOL 350 MG/ML SOLN COMPARISON:  July 02, 2023 FINDINGS: Cardiovascular: A right-sided venous Port-A-Cath is seen. Satisfactory opacification of the pulmonary arteries to the segmental level. No evidence of pulmonary embolism. Normal heart size. No pericardial effusion. Mediastinum/Nodes: No enlarged mediastinal, hilar, or axillary lymph nodes. A stable appearing area of soft tissue attenuation is seen at the thoracic inlet. This extends into the superior mediastinum and subsequently surrounds the trachea and esophagus. Tracheal narrowing is again seen at the level of the thoracic inlet. Lungs/Pleura: There is no evidence of acute infiltrate, pleural effusion or pneumothorax. Upper Abdomen: A partially visualized percutaneous gastrostomy tube is noted. There are large bilateral simple renal cysts. Musculoskeletal: No chest wall abnormality. No acute or  significant osseous findings. Review of the MIP images confirms the above findings. IMPRESSION: 1. No evidence of pulmonary embolism or other acute intrathoracic process. 2. Stable findings consistent with the patient's known laryngeal cancer. 3. Large bilateral simple renal cysts. No follow-up imaging is recommended. This recommendation follows ACR consensus guidelines: Management of the Incidental Renal Mass on CT: A White Paper of the ACR Incidental Findings Committee. J Am Coll Radiol [id number]. Electronically Signed   By: Aram Candela M.D.   On: 07/15/2023 01:35   DG Chest Portable 1 View Result Date: 07/14/2023 CLINICAL DATA:  Worsening shortness of breath. EXAM: PORTABLE CHEST 1 VIEW COMPARISON:  July 02, 2023 FINDINGS: There is stable  right-sided venous Port-A-Cath positioning. The heart size and mediastinal contours are within normal limits. There is mild right infrahilar atelectasis and/or infiltrate. No pleural effusion or pneumothorax is identified. Multilevel degenerative changes are seen throughout the thoracic spine. IMPRESSION: Mild right infrahilar atelectasis and/or infiltrate. Electronically Signed   By: Aram Candela M.D.   On: 07/14/2023 23:41   ECHOCARDIOGRAM COMPLETE Result Date: 07/03/2023    ECHOCARDIOGRAM REPORT   Patient Name:   ADRICK KESTLER Date of Exam: 07/03/2023 Medical Rec #:  [id number]      Height:       72.0 in Accession #:    [id number]     Weight:       242.5 lb Date of Birth:  1956/03/11     BSA:          2.312 m Patient Age:    41 years       BP:           117/69 mmHg Patient Gender: M              HR:           96 bpm. Exam Location:  [facility] Procedure: 2D Echo, Cardiac Doppler and Color Doppler (Both Spectral and Color            Flow Doppler were utilized during procedure). Indications:    Elevated Troponin  History:        Patient has prior history of Echocardiogram examinations, most                 recent 09/20/2021. COPD, Signs/Symptoms:Shortness of Breath; Risk                 Factors:Hypertension, Diabetes, Current Smoker and Dyslipidemia.  Sonographer:    Lucendia Herrlich RCS Referring Phys: [id number] JUSTIN B HOWERTER IMPRESSIONS  1. Left ventricular ejection fraction, by estimation, is 65 to 70%. The left ventricle has normal function. The left ventricle has no regional wall motion abnormalities. There is mild concentric left ventricular hypertrophy. Left ventricular diastolic parameters are consistent with Grade I diastolic dysfunction (impaired relaxation).  2. Right ventricular systolic function is normal. The right ventricular size is normal.  3. The mitral valve is normal in structure. No evidence of mitral valve regurgitation. No evidence of mitral stenosis.  4. The aortic valve is normal  in structure. Aortic valve regurgitation is not visualized. No aortic stenosis is present.  5. The inferior vena cava is normal in size with greater than 50% respiratory variability, suggesting right atrial pressure of 3 mmHg. FINDINGS  Left Ventricle: Left ventricular ejection fraction, by estimation, is 65 to 70%. The left ventricle has normal function. The left ventricle has no regional wall motion abnormalities. Strain imaging was not performed. The left ventricular internal cavity  size  was normal in size. There is mild concentric left ventricular hypertrophy. Left ventricular diastolic parameters are consistent with Grade I diastolic dysfunction (impaired relaxation). Right Ventricle: The right ventricular size is normal. No increase in right ventricular wall thickness. Right ventricular systolic function is normal. Left Atrium: Left atrial size was normal in size. Right Atrium: Right atrial size was normal in size. Pericardium: There is no evidence of pericardial effusion. Mitral Valve: The mitral valve is normal in structure. No evidence of mitral valve regurgitation. No evidence of mitral valve stenosis. Tricuspid Valve: The tricuspid valve is normal in structure. Tricuspid valve regurgitation is trivial. No evidence of tricuspid stenosis. Aortic Valve: The aortic valve is normal in structure. Aortic valve regurgitation is not visualized. No aortic stenosis is present. Aortic valve peak gradient measures 11.0 mmHg. Pulmonic Valve: The pulmonic valve was normal in structure. Pulmonic valve regurgitation is trivial. No evidence of pulmonic stenosis. Aorta: The aortic root is normal in size and structure. Venous: The inferior vena cava is normal in size with greater than 50% respiratory variability, suggesting right atrial pressure of 3 mmHg. IAS/Shunts: No atrial level shunt detected by color flow Doppler. Additional Comments: 3D imaging was not performed.  LEFT VENTRICLE PLAX 2D LVIDd:         5.20 cm    Diastology LVIDs:         3.00 cm   LV e' medial:    6.42 cm/s LV PW:         1.10 cm   LV E/e' medial:  12.2 LV IVS:        1.00 cm   LV e' lateral:   7.62 cm/s LVOT diam:     2.20 cm   LV E/e' lateral: 10.3 LV SV:         67 LV SV Index:   29 LVOT Area:     3.80 cm  RIGHT VENTRICLE             IVC RV S prime:     17.40 cm/s  IVC diam: 1.90 cm TAPSE (M-mode): 2.5 cm LEFT ATRIUM             Index        RIGHT ATRIUM           Index LA diam:        3.50 cm 1.51 cm/m   RA Area:     21.60 cm LA Vol (A2C):   51.7 ml 22.36 ml/m  RA Volume:   61.10 ml  26.42 ml/m LA Vol (A4C):   50.5 ml 21.84 ml/m LA Biplane Vol: 53.3 ml 23.05 ml/m  AORTIC VALVE AV Area (Vmax): 2.61 cm AV Vmax:        166.00 cm/s AV Peak Grad:   11.0 mmHg LVOT Vmax:      114.00 cm/s LVOT Vmean:     74.500 cm/s LVOT VTI:       0.176 m  AORTA Ao Root diam: 3.70 cm Ao Asc diam:  3.60 cm MITRAL VALVE MV Area (PHT): 5.54 cm     SHUNTS MV Decel Time: 137 msec     Systemic VTI:  0.18 m MV E velocity: 78.40 cm/s   Systemic Diam: 2.20 cm MV A velocity: 111.00 cm/s MV E/A ratio:  0.71 Arvilla Meres MD Electronically signed by Arvilla Meres MD Signature Date/Time: 07/03/2023/5:40:02 PM    Final    CT Angio Chest PE W/Cm &/Or Wo Cm Result Date: 07/02/2023 CLINICAL DATA:  Pulmonary  embolism (PE) suspected, high prob. Shortness of breath EXAM: CT ANGIOGRAPHY CHEST WITH CONTRAST TECHNIQUE: Multidetector CT imaging of the chest was performed using the standard protocol during bolus administration of intravenous contrast. Multiplanar CT image reconstructions and MIPs were obtained to evaluate the vascular anatomy. RADIATION DOSE REDUCTION: This exam was performed according to the departmental dose-optimization program which includes automated exposure control, adjustment of the mA and/or kV according to patient size and/or use of iterative reconstruction technique. CONTRAST:  75mL OMNIPAQUE IOHEXOL 350 MG/ML SOLN COMPARISON:  06/15/2023 FINDINGS:  Cardiovascular: No filling defects in the pulmonary arteries to suggest pulmonary emboli. Heart is normal size. Aorta is normal caliber. Mediastinum/Nodes: Abnormal soft tissue at the thoracic inlet and extending into the upper mediastinum surrounding the trachea and esophagus corresponding to known laryngeal cancer. This narrows the trachea at the level of the thoracic inlet. Findings are similar to prior study. No adenopathy. Lungs/Pleura: Lungs are clear. No focal airspace opacities or suspicious nodules. No effusions. Upper Abdomen: No acute findings Musculoskeletal: Right upper chest wall Port-A-Cath remains in place. No acute bony abnormality. Review of the MIP images confirms the above findings. IMPRESSION: No evidence of pulmonary embolus. Abnormal soft tissue at the thoracic inlet and upper mediastinum surrounding the trachea and esophagus compatible with patient's known cancer. This narrows the tracheal lumen significantly at the level of the thoracic inlet. Electronically Signed   By: Charlett Nose M.D.   On: 07/02/2023 21:06   DG Chest Port 1 View Result Date: 07/02/2023 CLINICAL DATA:  Shortness of breath EXAM: PORTABLE CHEST 1 VIEW COMPARISON:  06/15/2023 FINDINGS: Lungs are clear.  No pleural effusion or pneumothorax. The heart is normal in size. Right chest power port terminates in the lower SVC. IMPRESSION: No acute cardiopulmonary disease. Electronically Signed   By: Charline Bills M.D.   On: 07/02/2023 20:10   VAS [place] ABI WITH/WO TBI Result Date: 06/28/2023  LOWER EXTREMITY DOPPLER STUDY Patient Name:  JOSEDANIEL HAYE  Date of Exam:   06/26/2023 Medical Rec #: [id number]       Accession #:    [id number] Date of Birth: August 10, 1955      Patient Gender: M Patient Age:   63 years Exam Location:  [facility] Procedure:      VAS [place] ABI WITH/WO TBI Referring Phys: --------------------------------------------------------------------------------  Indications: Patient complians of numbness in both feet. He  also has restless              leg syndrome which is worse at night. He denies claudication and              rest pain symptoms. He is not able to walk for long periods due to              SOB. High Risk         Hypertension, hyperlipidemia, Diabetes, past history of Factors:          smoking.  Comparison Study: Prior ABI 04/29/2014 right 1.13 left 1.42 Performing Technologist: Carlos American RVT  Examination Guidelines: A complete evaluation includes at minimum, Doppler waveform signals and systolic blood pressure reading at the level of bilateral brachial, anterior tibial, and posterior tibial arteries, when vessel segments are accessible. Bilateral testing is considered an integral part of a complete examination. Photoelectric Plethysmograph (PPG) waveforms and toe systolic pressure readings are included as required and additional duplex testing as needed. Limited examinations for reoccurring indications may be performed as noted.  ABI Findings: +---------+------------------+-----+---------+--------+ Right  Rt Pressure (mmHg)IndexWaveform Comment  +---------+------------------+-----+---------+--------+ Brachial 130                                      +---------+------------------+-----+---------+--------+ PTA      166               1.28 triphasic         +---------+------------------+-----+---------+--------+ DP       152               1.17 triphasic         +---------+------------------+-----+---------+--------+ Cletis Media               0.86 Normal            +---------+------------------+-----+---------+--------+ +---------+------------------+-----+---------+-------+ Left     Lt Pressure (mmHg)IndexWaveform Comment +---------+------------------+-----+---------+-------+ Brachial 129                                     +---------+------------------+-----+---------+-------+ PTA      166               1.28 triphasic         +---------+------------------+-----+---------+-------+ DP       179               1.38 triphasic        +---------+------------------+-----+---------+-------+ Great Toe156               1.20 Normal           +---------+------------------+-----+---------+-------+ +-------+-----------+-----------+------------+------------+ ABI/TBIToday's ABIToday's TBIPrevious ABIPrevious TBI +-------+-----------+-----------+------------+------------+ Right  1.26       .86        1.13                     +-------+-----------+-----------+------------+------------+ Left   1.38       1.2        1.42                     +-------+-----------+-----------+------------+------------+  Bilateral ABIs appear essentially unchanged compared to prior study on 04/29/2014.  Summary: Right: Resting right ankle-brachial index is within normal range. The right toe-brachial index is normal. Left: Resting left ankle-brachial index indicates noncompressible left lower extremity arteries. The left toe-brachial index is normal. *See table(s) above for measurements and observations.  Electronically signed by Lorine Bears MD on 06/28/2023 at 1:40:58 PM.    Final    IR REPLACE G-TUBE SIMPLE WO FLUORO Result Date: 06/22/2023 INDICATION: 68 year old male with surgically placed gastrostomy tube 48 hours ago. This has been damaged and displaced. The patient presents for attempted rescue/replacement. EXAM: IR TUBE PLACEMENT/REPLACEMENT/CHECK DISCONTINUATION OF GASTROSTOMY PLACEMENT MEDICATIONS: None ANESTHESIA/SEDATION: None CONTRAST:  10mL OMNIPAQUE IOHEXOL 300 MG/ML SOLN - administered into the gastric lumen. FLUOROSCOPY: Radiation Exposure Index (as provided by the fluoroscopic device): 100 mGy Kerma COMPLICATIONS: None PROCEDURE: Informed written consent was obtained from the patient and the patient's family after a thorough discussion of the procedural risks, benefits and alternatives. All questions were addressed. Maximal  Sterile Barrier Technique was utilized including caps, mask, sterile gowns, sterile gloves, sterile drape, hand hygiene and skin antiseptic. A timeout was performed prior to the initiation of the procedure. The epigastrium was prepped with Betadine in a sterile fashion, and a sterile drape was applied covering the operative field. A sterile gown and sterile  gloves were used for the procedure. Under fluoroscopic guidance, contrast was injected through the surgical site representing the ostomy of the prior gastrostomy. Contrast injected into the subcutaneous tissues, with no obvious tract into the stomach lumen. A Kumpe the catheter was then advanced through the surgical site into the soft tissues, with contrast injected attempting to identify the tract into the stomach. AP and oblique images were used in the attempt to find the possible tract. Ultimately we were unable to find a viable tract through the abdominal wall into the stomach lumen, with only excavation of the superficial subcutaneous tissues with contrast. We withdrew from the attempt with no tract identified. Patient tolerated the procedure well and remained hemodynamically stable throughout. No complications were encountered and no significant blood loss encountered. IMPRESSION: Status post failed attempt of rescue/placement of damaged and displaced surgical gastrostomy. Signed, Yvone Neu. Loreta Ave, DO [facility] Electronically Signed   By: Gilmer Mor D.O.   On: 06/22/2023 16:16    Microbiology: Results for orders placed or performed during the hospital encounter of 07/14/23  Culture, blood (Routine x 2)     Status: None (Preliminary result)   Collection Time: 07/14/23  9:11 PM   Specimen: BLOOD  Result Value Ref Range Status   Specimen Description   Final    BLOOD LEFT ANTECUBITAL Performed at [facility], [address], [place], [place] [address]    Special  Requests   Final    BOTTLES DRAWN AEROBIC AND ANAEROBIC Blood Culture adequate volume Performed at [facility], [address] [place], [place] [address]    Culture   Final    NO GROWTH 3 DAYS Performed at [facility], [address], [place], [place] [address]    Report Status PENDING  Incomplete  Culture, blood (Routine x 2)     Status: None (Preliminary result)   Collection Time: 07/14/23  9:33 PM   Specimen: BLOOD  Result Value Ref Range Status   Specimen Description   Final    BLOOD BLOOD LEFT ARM Performed at [facility], [address], [place], [place] [address]    Special Requests   Final    BOTTLES DRAWN AEROBIC AND ANAEROBIC Blood Culture results may not be optimal due to an inadequate volume of blood received in culture bottles Performed at [facility], [address] [place], [place] [address]    Culture   Final    NO GROWTH 3 DAYS Performed at [facility], [address] [place], [place] [address]    Report Status PENDING  Incomplete  Resp panel by RT-PCR (RSV, Flu A&B, Covid) Anterior Nasal Swab     Status: None   Collection Time: 07/15/23 12:18 AM   Specimen: Anterior Nasal Swab  Result Value Ref Range Status   SARS Coronavirus 2 by RT PCR NEGATIVE NEGATIVE Final    Comment: (NOTE) SARS-CoV-2 target nucleic acids are NOT DETECTED.  The SARS-CoV-2 RNA is generally detectable in upper respiratory specimens during the acute phase of infection. The lowest concentration of SARS-CoV-2 viral copies this assay can detect is 138 copies/mL. A negative result does not preclude SARS-Cov-2 infection and should not be used as the sole basis for treatment or other patient management decisions. A negative result may occur with  improper specimen collection/handling, submission of specimen other than nasopharyngeal swab, presence of viral mutation(s) within the areas targeted by this assay, and  inadequate number of viral copies(<138 copies/mL). A negative result must be combined with clinical observations, patient history, and epidemiological information. The expected result is Negative.  Fact Sheet for Patients:  BloggerCourse.com  Fact Sheet for Healthcare Providers:  SeriousBroker.it  This test is no t yet approved or cleared by the [place] FDA and  has been authorized for detection and/or diagnosis of SARS-CoV-2 by FDA under an Emergency Use Authorization (EUA). This EUA will remain  in effect (meaning this test can be used) for the duration of the COVID-19 declaration under Section 564(b)(1) of the Act, 21 U.S.C.section 360bbb-3(b)(1), unless the authorization is terminated  or revoked sooner.       Influenza A by PCR NEGATIVE NEGATIVE Final   Influenza B by PCR NEGATIVE NEGATIVE Final    Comment: (NOTE) The Xpert Xpress SARS-CoV-2/FLU/RSV plus assay is intended as an aid in the diagnosis of influenza from Nasopharyngeal swab specimens and should not be used as a sole basis for treatment. Nasal washings and aspirates are unacceptable for Xpert Xpress SARS-CoV-2/FLU/RSV testing.  Fact Sheet for Patients: BloggerCourse.com  Fact Sheet for Healthcare Providers: SeriousBroker.it  This test is not yet approved or cleared by the [place] FDA and has been authorized for detection and/or diagnosis of SARS-CoV-2 by FDA under an Emergency Use Authorization (EUA). This EUA will remain in effect (meaning this test can be used) for the duration of the COVID-19 declaration under Section 564(b)(1) of the Act, 21 U.S.C. section 360bbb-3(b)(1), unless the authorization is terminated or revoked.     Resp Syncytial Virus by PCR NEGATIVE NEGATIVE Final    Comment: (NOTE) Fact Sheet for Patients: BloggerCourse.com  Fact Sheet for Healthcare  Providers: SeriousBroker.it  This test is not yet approved or cleared by the [place] FDA and has been authorized for detection and/or diagnosis of SARS-CoV-2 by FDA under an Emergency Use Authorization (EUA). This EUA will remain in effect (meaning this test can be used) for the duration of the COVID-19 declaration under Section 564(b)(1) of the Act, 21 U.S.C. section 360bbb-3(b)(1), unless the authorization is terminated or revoked.  Performed at [facility], [address], [place], [place] [address]     Labs: CBC: Recent Labs  Lab 07/11/23 1137 07/14/23 2322 07/15/23 0449 07/16/23 0824 07/17/23 0313  WBC 14.3* 14.7* 10.9* 14.0* 13.4*  NEUTROABS 10.7* 14.1* 10.1* 10.8*  --   HGB 9.1* 8.5* 8.2* 7.8* 7.4*  HCT 30.5* 28.0* 28.1* 26.6* 26.0*  MCV 82.4 81.4 82.4 83.4 83.6  PLT 611* 515* 493* 509* 479*   Basic Metabolic Panel: Recent Labs  Lab 07/11/23 1137 07/14/23 2322 07/15/23 0449 07/16/23 0824 07/17/23 0313  NA 131* 126* 127* 132* 133*  K 4.5 5.0 5.5* 4.6 4.8  CL 91* 87* 90* 94* 93*  CO2 30 27 28 30 31   GLUCOSE 312* 463* 394* 271* 395*  BUN 34* 26* 26* 44* 42*  CREATININE 1.05 0.90 0.94 1.02 0.93  CALCIUM 9.4 8.9 9.1 9.2 9.0  MG  --  2.2 2.2  --   --   PHOS  --   --  4.5  --   --    Liver Function Tests: Recent Labs  Lab 07/11/23 1137 07/14/23 2322 07/15/23 0449  AST 14* 15 16  ALT 16 19 18   ALKPHOS 83 106 100  BILITOT 0.5 0.6 0.5  PROT 8.3* 8.6* 8.4*  ALBUMIN 3.1* 3.2* 3.0*   CBG: Recent Labs  Lab 07/16/23 1204 07/16/23 1812 07/16/23 2049 07/17/23  1610 07/17/23 1147  GLUCAP 300* 209* 394* 357* 111*    Discharge time spent: approximately 45 minutes spent on discharge counseling, evaluation of patient on day of discharge, and coordination of discharge planning with nursing, social work, pharmacy and case management  Signed: Alberteen Sam, MD [facility] 07/17/2023

## 2023-07-18 ENCOUNTER — Encounter (HOSPITAL_COMMUNITY): Payer: Self-pay | Admitting: Otolaryngology

## 2023-07-18 NOTE — Progress Notes (Signed)
 SDW CALL  Patient was given pre-op instructions over the phone. The opportunity was given for the patient to ask questions. No further questions asked. Patient verbalized understanding of instructions given.   PCP - Johny Blamer MD Cardiologist - Fransisco Hertz  PPM/ICD - deneis Device Orders -  Rep Notified -   Chest x-ray - 07/14/23 EKG - 07/15/23 Stress Test - 09/21/21 ECHO - 07/03/23 Cardiac Cath - deneis  Sleep Study - denies CPAP -     COVID TEST-    Anesthesia review: yes-recent hospitaliztion  Patient denies shortness of breath, fever, cough and chest pain over the phone call    Surgical Instructions    Your procedure is scheduled on March 12  Report to [facility] Main Entrance "A" at 1130 A.M., then check in with the Admitting office.  Call this number if you have problems the morning of surgery:  [phone]    Remember:  Do not eat or drink after midnight the night before your surgery- No tube feedings after midnight.     Take these medicines the morning of surgery with A SIP OF WATER: Augmentin,prednisone,protonix. PRN oxycodone   As of today, STOP taking any Aspirin (unless otherwise instructed by your surgeon) Aleve, Naproxen, Ibuprofen, Motrin, Advil, Goody's, BC's, all herbal medications, fish oil, and all vitamins.  [place] is not responsible for any belongings or valuables. .   Do NOT Smoke (Tobacco/Vaping)  24 hours prior to your procedure  If you use a CPAP at night, you may bring your mask for your overnight stay.   Contacts, glasses, hearing aids, dentures or partials may not be worn into surgery, please bring cases for these belongings   Patients discharged the day of surgery will not be allowed to drive home, and someone needs to stay with them for 24 hours.    Please refer to the [facility] website for the visitor guidelines for Inpatients (after your surgery is over and you are in a regular room).     Special instructions:     Oral Hygiene is also important to reduce your risk of infection.  Remember - BRUSH YOUR TEETH THE MORNING OF SURGERY WITH YOUR REGULAR TOOTHPASTE   Day of Surgery:  Take a shower the day of or night before with antibacterial soap. Wear Clean/Comfortable clothing the morning of surgery Do not apply any deodorants/lotions.   Do not wear jewelry or makeup Do not wear lotions, powders, perfumes/colognes, or deodorant. Do not shave 48 hours prior to surgery.  Men may shave face and neck. Do not bring valuables to the hospital. Do not wear nail polish, gel polish, artificial nails, or any other type of covering on natural nails (fingers and toes) If you have artificial nails or gel coating that need to be removed by a nail salon, please have this removed prior to surgery. Artificial nails or gel coating may interfere with anesthesia's ability to adequately monitor your vital signs. Remember to brush your teeth WITH YOUR REGULAR TOOTHPASTE.

## 2023-07-18 NOTE — Progress Notes (Signed)
 LVM with surgery time change for 07/19/23 1130-1230, arrival 0930, and to follow all previous instructions given.

## 2023-07-18 NOTE — Anesthesia Preprocedure Evaluation (Signed)
 Anesthesia Evaluation  Patient identified by MRN, date of birth, ID band Patient awake    Reviewed: Allergy & Precautions, NPO status , Patient's Chart, lab work & pertinent test results  Airway Mallampati: IV  TM Distance: >3 FB Neck ROM: Full    Dental  (+) Teeth Intact, Dental Advisory Given   Pulmonary shortness of breath and with exertion, COPD,  COPD inhaler, former smoker Quit smoking 2022, 18 pack year history Hx laryngeal ca s/p Gtube P/w stridor  Last CT chest: A stable appearing area of soft tissue attenuation is seen at the thoracic inlet. This extends into the superior mediastinum and subsequently surrounds the trachea and esophagus. Tracheal narrowing is again seen at the level of the thoracic inlet.    Pulmonary exam normal breath sounds clear to auscultation       Cardiovascular hypertension (no home meds, 121/72 preop), + Peripheral Vascular Disease  Normal cardiovascular exam Rhythm:Regular Rate:Normal  Echo 06/2023  1. Left ventricular ejection fraction, by estimation, is 65 to 70%. The  left ventricle has normal function. The left ventricle has no regional  wall motion abnormalities. There is mild concentric left ventricular  hypertrophy. Left ventricular diastolic  parameters are consistent with Grade I diastolic dysfunction (impaired  relaxation).   2. Right ventricular systolic function is normal. The right ventricular  size is normal.   3. The mitral valve is normal in structure. No evidence of mitral valve  regurgitation. No evidence of mitral stenosis.   4. The aortic valve is normal in structure. Aortic valve regurgitation is  not visualized. No aortic stenosis is present.   5. The inferior vena cava is normal in size with greater than 50%  respiratory variability, suggesting right atrial pressure of 3 mmHg.     Neuro/Psych negative neurological ROS  negative psych ROS   GI/Hepatic ,GERD   Controlled and Medicated,,(+)     substance abuse  cocaine use and marijuana use  Endo/Other  diabetes  Obesity BMI 31  Renal/GU negative Renal ROS  negative genitourinary   Musculoskeletal  (+) Arthritis , Osteoarthritis,    Abdominal   Peds  Hematology  (+) Blood dyscrasia, anemia Hb 7.4, plt 479 Hx NHL s/p chemorad 2020   Anesthesia Other Findings   Reproductive/Obstetrics negative OB ROS                             Anesthesia Physical Anesthesia Plan  ASA: 4  Anesthesia Plan: General   Post-op Pain Management: Tylenol PO (pre-op)*   Induction: Intravenous  PONV Risk Score and Plan: 2 and Ondansetron, Dexamethasone, Midazolam and Treatment may vary due to age or medical condition  Airway Management Planned: Oral ETT  Additional Equipment: None  Intra-op Plan:   Post-operative Plan: Extubation in OR  Informed Consent: I have reviewed the patients History and Physical, chart, labs and discussed the procedure including the risks, benefits and alternatives for the proposed anesthesia with the patient or authorized representative who has indicated his/her understanding and acceptance.     Dental advisory given  Plan Discussed with: CRNA  Anesthesia Plan Comments: (Concerning narrowing of trachea at thoracic inlet, will maintain spontaneous ventilaiton. Multiple ETT sizes available)       Anesthesia Quick Evaluation

## 2023-07-19 ENCOUNTER — Ambulatory Visit (HOSPITAL_BASED_OUTPATIENT_CLINIC_OR_DEPARTMENT_OTHER): Payer: Self-pay | Admitting: Anesthesiology

## 2023-07-19 ENCOUNTER — Other Ambulatory Visit: Payer: Self-pay

## 2023-07-19 ENCOUNTER — Ambulatory Visit (HOSPITAL_COMMUNITY): Payer: Self-pay | Admitting: Anesthesiology

## 2023-07-19 ENCOUNTER — Ambulatory Visit (HOSPITAL_COMMUNITY)
Admission: RE | Admit: 2023-07-19 | Discharge: 2023-07-19 | Disposition: A | Discharge status: Home / Self Care | Payer: PPO | Attending: Otolaryngology | Admitting: Otolaryngology

## 2023-07-19 ENCOUNTER — Encounter (HOSPITAL_COMMUNITY): Payer: Self-pay | Admitting: Otolaryngology

## 2023-07-19 ENCOUNTER — Encounter (HOSPITAL_COMMUNITY)
Admission: RE | Disposition: A | Discharge status: Home / Self Care | Payer: Self-pay | Source: Home / Self Care | Attending: Otolaryngology

## 2023-07-19 DIAGNOSIS — J449 Chronic obstructive pulmonary disease, unspecified: Secondary | ICD-10-CM | POA: Insufficient documentation

## 2023-07-19 DIAGNOSIS — K219 Gastro-esophageal reflux disease without esophagitis: Secondary | ICD-10-CM | POA: Diagnosis not present

## 2023-07-19 DIAGNOSIS — I10 Essential (primary) hypertension: Secondary | ICD-10-CM

## 2023-07-19 DIAGNOSIS — C320 Malignant neoplasm of glottis: Secondary | ICD-10-CM | POA: Insufficient documentation

## 2023-07-19 DIAGNOSIS — R1314 Dysphagia, pharyngoesophageal phase: Secondary | ICD-10-CM

## 2023-07-19 DIAGNOSIS — Z6831 Body mass index (BMI) 31.0-31.9, adult: Secondary | ICD-10-CM | POA: Diagnosis not present

## 2023-07-19 DIAGNOSIS — E669 Obesity, unspecified: Secondary | ICD-10-CM | POA: Diagnosis not present

## 2023-07-19 DIAGNOSIS — C781 Secondary malignant neoplasm of mediastinum: Secondary | ICD-10-CM | POA: Diagnosis not present

## 2023-07-19 DIAGNOSIS — F1290 Cannabis use, unspecified, uncomplicated: Secondary | ICD-10-CM | POA: Diagnosis not present

## 2023-07-19 DIAGNOSIS — E119 Type 2 diabetes mellitus without complications: Secondary | ICD-10-CM | POA: Insufficient documentation

## 2023-07-19 DIAGNOSIS — Z923 Personal history of irradiation: Secondary | ICD-10-CM | POA: Diagnosis not present

## 2023-07-19 DIAGNOSIS — C329 Malignant neoplasm of larynx, unspecified: Secondary | ICD-10-CM | POA: Diagnosis not present

## 2023-07-19 DIAGNOSIS — Z87891 Personal history of nicotine dependence: Secondary | ICD-10-CM | POA: Insufficient documentation

## 2023-07-19 DIAGNOSIS — Z9221 Personal history of antineoplastic chemotherapy: Secondary | ICD-10-CM | POA: Diagnosis not present

## 2023-07-19 HISTORY — PX: ESOPHAGOSCOPY WITH DILITATION: SHX5618

## 2023-07-19 LAB — CULTURE, BLOOD (ROUTINE X 2)
Culture: NO GROWTH
Culture: NO GROWTH
Special Requests: ADEQUATE

## 2023-07-19 SURGERY — ESOPHAGOSCOPY, WITH DILATION
Anesthesia: General | Site: Esophagus

## 2023-07-19 MED ORDER — HYDROCORTISONE SOD SUC (PF) 100 MG IJ SOLR
INTRAMUSCULAR | Status: DC | PRN
  Administered 2023-07-19: 100 mg via INTRAVENOUS

## 2023-07-19 MED ORDER — LIDOCAINE 2% (20 MG/ML) 5 ML SYRINGE
INTRAMUSCULAR | Status: AC
  Filled 2023-07-19: qty 5

## 2023-07-19 MED ORDER — OXYCODONE HCL 5 MG PO TABS: 5.0000 mg | ORAL_TABLET | Freq: Once | ORAL | Status: DC | PRN

## 2023-07-19 MED ORDER — OXYCODONE HCL 5 MG/5ML PO SOLN: 5.0000 mg | Freq: Once | ORAL | Status: DC | PRN

## 2023-07-19 MED ORDER — PROPOFOL 10 MG/ML IV BOLUS
INTRAVENOUS | Status: DC | PRN
  Administered 2023-07-19: 30 mg via INTRAVENOUS
  Administered 2023-07-19: 200 mg via INTRAVENOUS

## 2023-07-19 MED ORDER — ORAL CARE MOUTH RINSE: 15.0000 mL | Freq: Once | OROMUCOSAL | Status: AC

## 2023-07-19 MED ORDER — ROCURONIUM BROMIDE 10 MG/ML (PF) SYRINGE
PREFILLED_SYRINGE | INTRAVENOUS | Status: DC | PRN
  Administered 2023-07-19: 80 mg via INTRAVENOUS

## 2023-07-19 MED ORDER — ACETAMINOPHEN 10 MG/ML IV SOLN
INTRAVENOUS | Status: DC | PRN
  Administered 2023-07-19: 1000 mg via INTRAVENOUS

## 2023-07-19 MED ORDER — ARTIFICIAL TEARS OPHTHALMIC OINT
TOPICAL_OINTMENT | OPHTHALMIC | Status: AC
  Filled 2023-07-19: qty 3.5

## 2023-07-19 MED ORDER — GLYCOPYRROLATE 0.2 MG/ML IJ SOLN
INTRAMUSCULAR | Status: DC | PRN
  Administered 2023-07-19: .2 mg via INTRAVENOUS

## 2023-07-19 MED ORDER — ACETAMINOPHEN 10 MG/ML IV SOLN
INTRAVENOUS | Status: AC
  Filled 2023-07-19: qty 100

## 2023-07-19 MED ORDER — PROPOFOL 10 MG/ML IV BOLUS
INTRAVENOUS | Status: AC
  Filled 2023-07-19: qty 20

## 2023-07-19 MED ORDER — DEXAMETHASONE SODIUM PHOSPHATE 10 MG/ML IJ SOLN
INTRAMUSCULAR | Status: DC | PRN
  Administered 2023-07-19: 5 mg via INTRAVENOUS

## 2023-07-19 MED ORDER — MIDAZOLAM HCL 2 MG/2ML IJ SOLN
INTRAMUSCULAR | Status: DC | PRN
  Administered 2023-07-19: 2 mg via INTRAVENOUS

## 2023-07-19 MED ORDER — MIDAZOLAM HCL 2 MG/2ML IJ SOLN
INTRAMUSCULAR | Status: AC
  Filled 2023-07-19: qty 2

## 2023-07-19 MED ORDER — ALBUTEROL SULFATE HFA 108 (90 BASE) MCG/ACT IN AERS
INHALATION_SPRAY | RESPIRATORY_TRACT | Status: DC | PRN
Start: 2023-07-19 — End: 2023-07-19
  Administered 2023-07-19 (×2): 12 via RESPIRATORY_TRACT
  Administered 2023-07-19: 6 via RESPIRATORY_TRACT

## 2023-07-19 MED ORDER — CHLORHEXIDINE GLUCONATE 0.12 % MT SOLN
OROMUCOSAL | Status: AC
  Administered 2023-07-19: 15 mL via OROMUCOSAL
  Filled 2023-07-19: qty 15

## 2023-07-19 MED ORDER — FENTANYL CITRATE (PF) 250 MCG/5ML IJ SOLN
INTRAMUSCULAR | Status: DC | PRN
Start: 2023-07-19 — End: 2023-07-19
  Administered 2023-07-19: 50 ug via INTRAVENOUS

## 2023-07-19 MED ORDER — GLYCOPYRROLATE PF 0.2 MG/ML IJ SOSY
PREFILLED_SYRINGE | INTRAMUSCULAR | Status: AC
  Filled 2023-07-19: qty 1

## 2023-07-19 MED ORDER — ACETAMINOPHEN 500 MG PO TABS
1000.0000 mg | ORAL_TABLET | Freq: Once | ORAL | Status: DC
Start: 2023-07-19 — End: 2023-07-19
  Filled 2023-07-19: qty 2

## 2023-07-19 MED ORDER — ONDANSETRON HCL 4 MG/2ML IJ SOLN
INTRAMUSCULAR | Status: DC | PRN
  Administered 2023-07-19: 4 mg via INTRAVENOUS

## 2023-07-19 MED ORDER — 0.9 % SODIUM CHLORIDE (POUR BTL) OPTIME
TOPICAL | Status: DC | PRN
  Administered 2023-07-19: 1000 mL

## 2023-07-19 MED ORDER — SUGAMMADEX SODIUM 200 MG/2ML IV SOLN
INTRAVENOUS | Status: AC
  Filled 2023-07-19: qty 2

## 2023-07-19 MED ORDER — LIDOCAINE 2% (20 MG/ML) 5 ML SYRINGE
INTRAMUSCULAR | Status: DC | PRN
  Administered 2023-07-19: 60 mg via INTRAVENOUS

## 2023-07-19 MED ORDER — CHLORHEXIDINE GLUCONATE 0.12 % MT SOLN: 15.0000 mL | Freq: Once | OROMUCOSAL | Status: AC

## 2023-07-19 MED ORDER — ROCURONIUM BROMIDE 10 MG/ML (PF) SYRINGE
PREFILLED_SYRINGE | INTRAVENOUS | Status: AC
  Filled 2023-07-19: qty 10

## 2023-07-19 MED ORDER — LACTATED RINGERS IV SOLN: INTRAVENOUS | Status: DC

## 2023-07-19 MED ORDER — SUGAMMADEX SODIUM 200 MG/2ML IV SOLN
INTRAVENOUS | Status: DC | PRN
  Administered 2023-07-19 (×4): 50 mg via INTRAVENOUS

## 2023-07-19 MED ORDER — EPINEPHRINE 1 MG/10ML IJ SOSY
PREFILLED_SYRINGE | INTRAMUSCULAR | Status: DC | PRN
  Administered 2023-07-19: 50 ug via INTRAVENOUS
  Administered 2023-07-19: 150 ug via INTRAVENOUS

## 2023-07-19 MED ORDER — EPINEPHRINE 1 MG/10ML IJ SOSY
PREFILLED_SYRINGE | INTRAMUSCULAR | Status: AC
  Filled 2023-07-19: qty 10

## 2023-07-19 MED ORDER — PHENYLEPHRINE 80 MCG/ML (10ML) SYRINGE FOR IV PUSH (FOR BLOOD PRESSURE SUPPORT)
PREFILLED_SYRINGE | INTRAVENOUS | Status: DC | PRN
  Administered 2023-07-19: 160 ug via INTRAVENOUS
  Administered 2023-07-19: 80 ug via INTRAVENOUS
  Administered 2023-07-19: 160 ug via INTRAVENOUS

## 2023-07-19 MED ORDER — HYDROMORPHONE HCL 1 MG/ML IJ SOLN: 0.2500 mg | INTRAMUSCULAR | Status: DC | PRN

## 2023-07-19 MED ORDER — AMISULPRIDE (ANTIEMETIC) 5 MG/2ML IV SOLN: 10.0000 mg | Freq: Once | INTRAVENOUS | Status: DC | PRN

## 2023-07-19 MED ORDER — ONDANSETRON HCL 4 MG/2ML IJ SOLN: 4.0000 mg | Freq: Once | INTRAMUSCULAR | Status: DC | PRN

## 2023-07-19 MED ORDER — FENTANYL CITRATE (PF) 250 MCG/5ML IJ SOLN
INTRAMUSCULAR | Status: AC
  Filled 2023-07-19: qty 5

## 2023-07-19 MED ORDER — ESMOLOL HCL-SODIUM CHLORIDE 2000 MG/100ML IV SOLN
INTRAVENOUS | Status: DC | PRN
  Administered 2023-07-19: 30 mg via INTRAVENOUS

## 2023-07-19 SURGICAL SUPPLY — 13 items
CANISTER SUCT 3000ML PPV (MISCELLANEOUS) ×1 IMPLANT
COVER BACK TABLE 60X90IN (DRAPES) ×1 IMPLANT
DRAPE HALF SHEET 40X57 (DRAPES) ×1 IMPLANT
GLOVE BIO SURGEON STRL SZ7.5 (GLOVE) ×1 IMPLANT
GUARD TEETH (MISCELLANEOUS) IMPLANT
KIT BASIN OR (CUSTOM PROCEDURE TRAY) ×1 IMPLANT
KIT TURNOVER KIT B (KITS) ×1 IMPLANT
NS IRRIG 1000ML POUR BTL (IV SOLUTION) ×1 IMPLANT
PAD ARMBOARD 7.5X6 YLW CONV (MISCELLANEOUS) ×2 IMPLANT
SURGILUBE 2OZ TUBE FLIPTOP (MISCELLANEOUS) ×1 IMPLANT
TOWEL GREEN STERILE FF (TOWEL DISPOSABLE) ×2 IMPLANT
TUBE CONNECTING 12X1/4 (SUCTIONS) ×1 IMPLANT
WATER STERILE IRR 1000ML POUR (IV SOLUTION) ×1 IMPLANT

## 2023-07-19 NOTE — Anesthesia Postprocedure Evaluation (Signed)
 Anesthesia Post Note  Patient: Jesus [name]  Procedure(s) Performed: ESOPHAGOSCOPY, WITH DILATION (Esophagus)     Patient location during evaluation: PACU Anesthesia Type: General Level of consciousness: awake and alert, oriented and patient cooperative Pain management: pain level controlled Vital Signs Assessment: post-procedure vital signs reviewed and stable Respiratory status: spontaneous breathing, nonlabored ventilation and respiratory function stable Cardiovascular status: blood pressure returned to baseline and stable Postop Assessment: no apparent nausea or vomiting Anesthetic complications: no Comments: Bronchospasm intra-op requiring epi to break   No notable events documented.  Last Vitals:  Vitals:   07/19/23 1245 07/19/23 1300  BP: 117/62 114/75  Pulse: (!) 102 99  Resp: 20 17  Temp:  36.8 C  SpO2: 95% 96%    Last Pain:  Vitals:   07/19/23 1300  TempSrc:   PainSc: 0-No pain                 Lannie Fields

## 2023-07-19 NOTE — Op Note (Signed)
 OPERATIVE REPORT  DATE OF SURGERY: 07/19/2023  PATIENT:  Jesus [name],  68 y.o. male  PRE-OPERATIVE DIAGNOSIS:  Vocal cord cancer, Pharyngoesophageal dysphagia  POST-OPERATIVE DIAGNOSIS:  Vocal cord cancer, Pharyngoesophageal dysphagia  PROCEDURE:  Procedure(s): ESOPHAGOSCOPY, WITH DILATION Direct laryngoscopy  SURGEON:  Susy Frizzle, MD  ASSISTANTS: None  ANESTHESIA:   General   EBL: Minimal ml  DRAINS: None  LOCAL MEDICATIONS USED:  None  SPECIMEN:  none  COUNTS:  Correct  PROCEDURE DETAILS: The patient was taken to the operating room and placed on the operating table in the supine position. Following induction of general endotracheal anesthesia, the patient was turned 90 degrees and the patient was draped for upper airway endoscopy.  A maxillary tooth protector was used throughout the procedure.  1.  Direct laryngoscopy.  Shortly after repositioning the patient direct laryngoscopy was attempted and we were beginning to have difficulty with ventilation and with high airway pressures.  A flexible bronchoscope was passed through the endotracheal tube and there was evidence of a mass just beyond the tip of the endotracheal tube invading into the trachea.  It was only partially obstructing.  I was able to remove the endotracheal tube and replace it and intubate a little bit further beyond the obstruction and then there was no more difficulty with ventilation.  2.  Esophagoscopy with dilatation.  The rigid cervical esophagoscope's were attempted, the smallest size would not pass through the esophageal obstruction.  Using the laryngoscope the esophagus was dilated using the [facility] dilators starting at 20 [place] and working up to 36 [place].  Further attempts at esophagoscopy were not successful.  The patient was awakened extubated and transferred to recovery in stable condition.  Blood    PATIENT DISPOSITION:  To PACU, stable

## 2023-07-19 NOTE — Anesthesia Procedure Notes (Addendum)
 Procedure Name: Intubation Date/Time: 07/19/2023 11:14 AM  Performed by: Colbert Coyer, CRNAPre-anesthesia Checklist: Patient identified, Emergency Drugs available, Suction available and Patient being monitored Patient Re-evaluated:Patient Re-evaluated prior to induction Oxygen Delivery Method: Circle System Utilized Preoxygenation: Pre-oxygenation with 100% oxygen Induction Type: IV induction Ventilation: Two handed mask ventilation required Laryngoscope Size: Glidescope and 4 Grade View: Grade I Tube type: Oral Tube size: 7.0 mm Number of attempts: 1 Airway Equipment and Method: Stylet and Oral airway Placement Confirmation: ETT inserted through vocal cords under direct vision, positive ETCO2 and breath sounds checked- equal and bilateral Secured at: 22 cm Tube secured with: Tape Dental Injury: Teeth and Oropharynx as per pre-operative assessment

## 2023-07-19 NOTE — Transfer of Care (Signed)
 Immediate Anesthesia Transfer of Care Note  Patient: Jesus [name]  Procedure(s) Performed: ESOPHAGOSCOPY, WITH DILATION (Esophagus)  Patient Location: PACU  Anesthesia Type:General  Level of Consciousness: awake, alert , oriented, and patient cooperative  Airway & Oxygen Therapy: Patient Spontanous Breathing and Patient connected to face mask oxygen  Post-op Assessment: Report given to RN and Post -op Vital signs reviewed and stable  Post vital signs: Reviewed and stable  Last Vitals:  Vitals Value Taken Time  BP 136/86 07/19/23 1219  Temp 36.8 C 07/19/23 1219  Pulse 104 07/19/23 1223  Resp 21 07/19/23 1223  SpO2 100 % 07/19/23 1223  Vitals shown include unfiled device data.  Last Pain:  Vitals:   07/19/23 0948  TempSrc:   PainSc: 0-No pain         Complications: No notable events documented.

## 2023-07-19 NOTE — Discharge Instructions (Signed)
 Okay to attempt drinking liquids and soft foods.  If you experience any coughing or choking please do not do further attempt.

## 2023-07-19 NOTE — Addendum Note (Signed)
 Addendum  created 07/19/23 1516 by Colbert Coyer, CRNA   Flowsheet accepted

## 2023-07-19 NOTE — H&P (Signed)
 Jesus [name] is an 68 y.o. male.   Chief Complaint: Dysphagia HPI: History of T1 glottic cancer treated with XRT about 2 years ago.  He subsequently developed mediastinal metastatic disease.  He is under the care of medical oncology.  He has had difficulty swallowing.  He has had some aspiration.  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- last radiation 06-2018   Cataract    removed both eyes with l;ens implants    COPD (chronic obstructive pulmonary disease) (HCC)    Dyspnea    Family history of colon cancer    in his brother- dx'd age 84    History of chemotherapy    last 06-01-2018   History of kidney stones    History of radiation therapy    last radiation 06-2018   Hyperlipidemia    currently under control   Hypertension    Irregular heart beats    Lymphadenopathy    Pain, lower leg    Bilateral   Peripheral arterial disease (HCC)    Pre-diabetes    Red-green color blindness    RLS (restless legs syndrome)    Snores    Wears glasses     Past Surgical History:  Procedure Laterality Date   CATARACT EXTRACTION W/ INTRAOCULAR LENS  IMPLANT, BILATERAL     COLONOSCOPY     DIRECT LARYNGOSCOPY Right 02/03/2022   Procedure: DIRECT LARYNGOSCOPY WITH BIOPSY OF RIGHT FALSE VOCAL CORD;  Surgeon: Osborn Coho, MD;  Location: [facility] OR;  Service: ENT;  Laterality: Right;   dislodged salava stone     FRACTURE SURGERY     HAND ARTHROPLASTY  1995   crushed left hand   INGUINAL LYMPH NODE BIOPSY Left 01/02/2018   Procedure: LEFT INGUINAL LYMPH NODE BIOPSY;  Surgeon: Emelia Loron, MD;  Location: [facility] OR;  Service: General;  Laterality: Left;   IR IMAGING GUIDED PORT INSERTION  01/15/2018   IR IMAGING GUIDED PORT INSERTION  08/10/2022   IR REMOVAL TUN ACCESS W/ PORT W/O FL MOD SED  03/11/2019    IR REPLACE G-TUBE SIMPLE WO FLUORO  06/21/2023   LAPAROSCOPIC INSERTION GASTROSTOMY TUBE N/A 06/19/2023   Procedure: LAPAROSCOPIC INSERTION GASTROSTOMY TUBE;  Surgeon: Quentin Ore, MD;  Location: [facility] ORS;  Service: General;  Laterality: N/A;   LAPAROSCOPIC INSERTION GASTROSTOMY TUBE N/A 06/21/2023   Procedure: LAPAROSCOPIC INSERTION GASTROSTOMY TUBE;  Surgeon: Quentin Ore, MD;  Location: [facility] ORS;  Service: General;  Laterality: N/A;   MEDIASTINOSCOPY N/A 07/22/2022   Procedure: MEDIASTINOSCOPY;  Surgeon: Loreli Slot, MD;  Location: [facility] OR;  Service: Thoracic;  Laterality: N/A;   MICROLARYNGOSCOPY Left 01/17/2014   Procedure: MICROLARYNGOSCOPY WITH EXCISION OF THE BIOPSY OF LEFT VOCAL CORD LESION;  Surgeon: Serena Colonel, MD;  Location: [facility];  Service: ENT;  Laterality: Left;   MICROLARYNGOSCOPY N/A 09/16/2020   Procedure: MICROLARYNGOSCOPY with Biopsy of vocal cord lesion;  Surgeon: Osborn Coho, MD;  Location: [facility] OR;  Service: ENT;  Laterality: N/A;   ORIF FOOT FRACTURE  2005   left   REFRACTIVE SURGERY Right    removed cloudiness in right eye after cataract removal     Family History  Problem Relation Age of Onset   Breast  cancer Mother    Diabetes Father    Hypertension Father    Stroke Father    Mental illness Sister    Hypertension Daughter    Mental illness Daughter    Hypertension Brother    Colon cancer Brother 17       passed away 11-29-2018   Breast cancer Sister    Esophageal cancer Neg Hx    Colon polyps Neg Hx    Rectal cancer Neg Hx    Stomach cancer Neg Hx    Social History:  reports that he quit smoking about 2 years ago. His smoking use included cigarettes. He started smoking about 38 years ago. He has a 18 pack-year smoking history. He has never used smokeless tobacco. He reports that he does not currently use alcohol after a past usage of about 6.0 standard drinks of alcohol per week. He reports current drug use. Drugs: ,  Marijuana, and Cocaine.  Allergies:  Allergies  Allergen Reactions   Bee Venom Anaphylaxis   Lisinopril Other (See Comments)    Swelling in face   Antifungal [Miconazole Nitrate] Other (See Comments)     made his head hurt   Zolpidem Tartrate Er Other (See Comments)    Leg cramps    Medications Prior to Admission  Medication Sig Dispense Refill   acetaminophen (TYLENOL) 160 MG/5ML solution Place 20.3 mLs (650 mg total) into feeding tube every 6 (six) hours as needed for mild pain (pain score 1-3). 473 mL 0   albuterol (VENTOLIN HFA) 108 (90 Base) MCG/ACT inhaler Inhale 2 puffs into the lungs every 6 (six) hours as needed for wheezing or shortness of breath. 6.7 g 2   amoxicillin-clavulanate (AUGMENTIN) 400-57 MG/5ML suspension Place 10 mLs (800 mg total) into feeding tube 2 (two) times daily for 5 days. 100 mL 0   ferrous sulfate 220 (44 Fe) MG/5ML solution Place 6.8 mLs (300 mg total) into feeding tube every other day. 106 mL 0   glycopyrrolate (ROBINUL) 1 MG tablet Place 1 tablet (1 mg total) into feeding tube every other day. Crush tablet, place in water, and then place in feeding tube.     lidocaine-prilocaine (EMLA) cream Apply to affected area once 30 g 3   Nutritional Supplements (NUTREN 1.5) LIQD Give 2 cartons 4 times a day.  Flush with 30ml of water before and after each feeding.  Give additional of water TID for additional hydration.     oxyCODONE (ROXICODONE) 5 MG/5ML solution Place 5 mLs (5 mg total) into feeding tube every 6 (six) hours as needed for breakthrough pain. 75 mL 0   pantoprazole (PROTONIX) 40 MG tablet Take 1 tablet (40 mg total) by mouth daily. 30 tablet 0   predniSONE (DELTASONE) 20 MG tablet Place 2 tablets (40 mg total) into feeding tube daily with breakfast for 3 days. Crush tablet, place in water, and then place in feeding tube. 6 tablet 0   Protein (FEEDING SUPPLEMENT, PROSOURCE TF20,) liquid Place 60 mLs into feeding tube daily. 1800 mL 2   Water  For Irrigation, Sterile (FREE WATER) SOLN Place 125 mLs into feeding tube every 4 (four) hours. (Patient taking differently: Place 60 mLs into feeding tube every 4 (four) hours. Use 30ml prior to feeding supplement, and 30ml after each feeding supplement)      Results for orders placed or performed during the hospital encounter of 07/14/23 (from the past 48 hours)  Glucose, capillary     Status: Abnormal   Collection  Time: 07/17/23 11:47 AM  Result Value Ref Range   Glucose-Capillary 111 (H) 70 - 99 mg/dL    Comment: Glucose reference range applies only to samples taken after fasting for at least 8 hours.   No results found.  ROS: otherwise negative  Blood pressure 121/72, pulse (!) 112, temperature 98.5 F (36.9 C), temperature source Oral, resp. rate 18, height 6' (1.829 m), weight 103.4 kg, SpO2 98%.  PHYSICAL EXAM: Overall appearance:  Healthy appearing, in no distress Head:  Normocephalic, atraumatic. Ears: External auditory canals are clear; tympanic membranes are intact and the middle ears are free of any effusion. Nose: External nose is healthy in appearance. Internal nasal exam free of any lesions or obstruction. Oral Cavity/pharynx:  There are no mucosal lesions or masses identified. Hypopharynx/Larynx: no signs of any mucosal lesions or masses identified.  Diffuse supraglottic edema but airway is patent. Neuro:  No identifiable neurologic deficits. Neck: No palpable neck masses.  Studies Reviewed: none    Assessment/Plan Dysphagia following radiation therapy for glottic cancer, recommend esophagoscopy with dilatation to see if we can improve his swallowing.  Serena Colonel 07/19/2023, 9:24 AM

## 2023-07-20 ENCOUNTER — Encounter (HOSPITAL_COMMUNITY): Payer: Self-pay | Admitting: Otolaryngology

## 2023-07-20 DIAGNOSIS — C329 Malignant neoplasm of larynx, unspecified: Secondary | ICD-10-CM | POA: Diagnosis not present

## 2023-07-22 DIAGNOSIS — C159 Malignant neoplasm of esophagus, unspecified: Secondary | ICD-10-CM | POA: Diagnosis not present

## 2023-07-22 DIAGNOSIS — R1310 Dysphagia, unspecified: Secondary | ICD-10-CM | POA: Diagnosis not present

## 2023-07-22 DIAGNOSIS — K9423 Gastrostomy malfunction: Secondary | ICD-10-CM | POA: Diagnosis not present

## 2023-07-22 DIAGNOSIS — E43 Unspecified severe protein-calorie malnutrition: Secondary | ICD-10-CM | POA: Diagnosis not present

## 2023-07-24 ENCOUNTER — Telehealth: Payer: Self-pay | Admitting: Radiation Oncology

## 2023-07-24 ENCOUNTER — Telehealth: Payer: Self-pay | Admitting: *Deleted

## 2023-07-24 DIAGNOSIS — R5383 Other fatigue: Secondary | ICD-10-CM | POA: Diagnosis not present

## 2023-07-24 DIAGNOSIS — R1314 Dysphagia, pharyngoesophageal phase: Secondary | ICD-10-CM | POA: Diagnosis not present

## 2023-07-24 DIAGNOSIS — D649 Anemia, unspecified: Secondary | ICD-10-CM | POA: Diagnosis not present

## 2023-07-24 DIAGNOSIS — C320 Malignant neoplasm of glottis: Secondary | ICD-10-CM | POA: Diagnosis not present

## 2023-07-24 DIAGNOSIS — J398 Other specified diseases of upper respiratory tract: Secondary | ICD-10-CM | POA: Diagnosis not present

## 2023-07-24 DIAGNOSIS — Z931 Gastrostomy status: Secondary | ICD-10-CM | POA: Diagnosis not present

## 2023-07-24 DIAGNOSIS — D75839 Thrombocytosis, unspecified: Secondary | ICD-10-CM | POA: Diagnosis not present

## 2023-07-24 DIAGNOSIS — E871 Hypo-osmolality and hyponatremia: Secondary | ICD-10-CM | POA: Diagnosis not present

## 2023-07-24 DIAGNOSIS — E119 Type 2 diabetes mellitus without complications: Secondary | ICD-10-CM | POA: Diagnosis not present

## 2023-07-24 NOTE — Telephone Encounter (Signed)
 Spoke to pt to schedule RECON/SIM with Dr. Basilio Cairo as requested by her via inbasket. Coordinated with [facility] team for same-day RECON and SIM.

## 2023-07-24 NOTE — Telephone Encounter (Signed)
 CMA noted two-sided paper to process for this patient.  This nurse checked [facility] weekly form files and desks for paperwork for.Jesus [name]'s application for [employer].  No pages found mixed in other forms or bins and folders uv desks.  Connected with [facility] [phone]) representative Melanie and Programmer, systems.  Confirmed page 5 reads Medical information. On back, page 6 reads patient attestation,  Para March asks how we received form that was e-mailed and mailed to client who she hopes has pages 1-4 and the attestation must be signed and dated.  There is only one page for the medical information.  CMA notified of above information.

## 2023-07-25 ENCOUNTER — Other Ambulatory Visit: Payer: Self-pay

## 2023-07-25 ENCOUNTER — Inpatient Hospital Stay: Admitting: Hematology

## 2023-07-25 ENCOUNTER — Inpatient Hospital Stay: Payer: PPO | Attending: Hematology

## 2023-07-25 ENCOUNTER — Inpatient Hospital Stay: Payer: PPO | Admitting: Dietician

## 2023-07-25 ENCOUNTER — Inpatient Hospital Stay: Payer: PPO

## 2023-07-25 VITALS — BP 139/78 | HR 63 | Temp 97.9°F | Resp 19 | Ht 72.0 in | Wt 228.1 lb

## 2023-07-25 DIAGNOSIS — Z79899 Other long term (current) drug therapy: Secondary | ICD-10-CM | POA: Diagnosis not present

## 2023-07-25 DIAGNOSIS — Z5112 Encounter for antineoplastic immunotherapy: Secondary | ICD-10-CM | POA: Insufficient documentation

## 2023-07-25 DIAGNOSIS — Z8572 Personal history of non-Hodgkin lymphomas: Secondary | ICD-10-CM | POA: Insufficient documentation

## 2023-07-25 DIAGNOSIS — C760 Malignant neoplasm of head, face and neck: Secondary | ICD-10-CM

## 2023-07-25 DIAGNOSIS — E785 Hyperlipidemia, unspecified: Secondary | ICD-10-CM | POA: Diagnosis not present

## 2023-07-25 DIAGNOSIS — E871 Hypo-osmolality and hyponatremia: Secondary | ICD-10-CM | POA: Insufficient documentation

## 2023-07-25 DIAGNOSIS — C320 Malignant neoplasm of glottis: Secondary | ICD-10-CM

## 2023-07-25 DIAGNOSIS — E43 Unspecified severe protein-calorie malnutrition: Secondary | ICD-10-CM | POA: Insufficient documentation

## 2023-07-25 DIAGNOSIS — Z7189 Other specified counseling: Secondary | ICD-10-CM

## 2023-07-25 DIAGNOSIS — Z87891 Personal history of nicotine dependence: Secondary | ICD-10-CM | POA: Diagnosis not present

## 2023-07-25 DIAGNOSIS — Z95828 Presence of other vascular implants and grafts: Secondary | ICD-10-CM

## 2023-07-25 LAB — CBC WITH DIFFERENTIAL (CANCER CENTER ONLY)
Abs Immature Granulocytes: 0.03 10*3/uL (ref 0.00–0.07)
Basophils Absolute: 0.1 10*3/uL (ref 0.0–0.1)
Basophils Relative: 0 %
Eosinophils Absolute: 0.1 10*3/uL (ref 0.0–0.5)
Eosinophils Relative: 1 %
HCT: 27.3 % — ABNORMAL LOW (ref 39.0–52.0)
Hemoglobin: 8.4 g/dL — ABNORMAL LOW (ref 13.0–17.0)
Immature Granulocytes: 0 %
Lymphocytes Relative: 16 %
Lymphs Abs: 1.9 10*3/uL (ref 0.7–4.0)
MCH: 24.4 pg — ABNORMAL LOW (ref 26.0–34.0)
MCHC: 30.8 g/dL (ref 30.0–36.0)
MCV: 79.4 fL — ABNORMAL LOW (ref 80.0–100.0)
Monocytes Absolute: 1.1 10*3/uL — ABNORMAL HIGH (ref 0.1–1.0)
Monocytes Relative: 9 %
Neutro Abs: 9.1 10*3/uL — ABNORMAL HIGH (ref 1.7–7.7)
Neutrophils Relative %: 74 %
Platelet Count: 514 10*3/uL — ABNORMAL HIGH (ref 150–400)
RBC: 3.44 MIL/uL — ABNORMAL LOW (ref 4.22–5.81)
RDW: 16.1 % — ABNORMAL HIGH (ref 11.5–15.5)
WBC Count: 12.3 10*3/uL — ABNORMAL HIGH (ref 4.0–10.5)
nRBC: 0 % (ref 0.0–0.2)

## 2023-07-25 LAB — CMP (CANCER CENTER ONLY)
ALT: 11 U/L (ref 0–44)
AST: 9 U/L — ABNORMAL LOW (ref 15–41)
Albumin: 3.7 g/dL (ref 3.5–5.0)
Alkaline Phosphatase: 99 U/L (ref 38–126)
Anion gap: 6 (ref 5–15)
BUN: 27 mg/dL — ABNORMAL HIGH (ref 8–23)
CO2: 37 mmol/L — ABNORMAL HIGH (ref 22–32)
Calcium: 10 mg/dL (ref 8.9–10.3)
Chloride: 86 mmol/L — ABNORMAL LOW (ref 98–111)
Creatinine: 0.84 mg/dL (ref 0.61–1.24)
GFR, Estimated: >60 mL/min (ref >60)
Glucose, Bld: 352 mg/dL — ABNORMAL HIGH (ref 70–99)
Potassium: 4.8 mmol/L (ref 3.5–5.1)
Sodium: 129 mmol/L — ABNORMAL LOW (ref 135–145)
Total Bilirubin: 0.4 mg/dL (ref 0.0–1.2)
Total Protein: 7.9 g/dL (ref 6.5–8.1)

## 2023-07-25 LAB — HEMOGLOBIN A1C
Hgb A1c MFr Bld: 8.9 % — ABNORMAL HIGH (ref 4.8–5.6)
Mean Plasma Glucose: 208.73 mg/dL

## 2023-07-25 LAB — TSH: TSH: 16.681 u[IU]/mL — ABNORMAL HIGH (ref 0.350–4.500)

## 2023-07-25 LAB — VITAMIN B12: Vitamin B-12: 750 pg/mL (ref 180–914)

## 2023-07-25 LAB — IRON AND IRON BINDING CAPACITY (CC-WL,HP ONLY)
Iron: 11 ug/dL — ABNORMAL LOW (ref 45–182)
Saturation Ratios: 4 % — ABNORMAL LOW (ref 17.9–39.5)
TIBC: 293 ug/dL (ref 250–450)
UIBC: 282 ug/dL (ref 117–376)

## 2023-07-25 MED ORDER — SODIUM CHLORIDE 0.9 % IV SOLN: Freq: Once | INTRAVENOUS | Status: AC

## 2023-07-25 MED ORDER — SODIUM CHLORIDE 0.9 % IV SOLN
200.0000 mg | Freq: Once | INTRAVENOUS | Status: AC
  Administered 2023-07-25: 200 mg via INTRAVENOUS
  Filled 2023-07-25: qty 200

## 2023-07-25 MED ORDER — SODIUM CHLORIDE 0.9% FLUSH
10.0000 mL | Freq: Once | INTRAVENOUS | Status: AC
Start: 2023-07-25 — End: 2023-07-25
  Administered 2023-07-25: 10 mL

## 2023-07-25 MED ORDER — HEPARIN SOD (PORK) LOCK FLUSH 100 UNIT/ML IV SOLN
500.0000 [IU] | Freq: Once | INTRAVENOUS | Status: AC | PRN
  Administered 2023-07-25: 500 [IU]

## 2023-07-25 MED ORDER — SODIUM CHLORIDE 0.9 % IV SOLN
Freq: Once | INTRAVENOUS | Status: AC
Start: 2023-07-25 — End: 2023-07-25

## 2023-07-25 MED ORDER — SODIUM CHLORIDE 0.9% FLUSH
10.0000 mL | INTRAVENOUS | Status: DC | PRN
  Administered 2023-07-25: 10 mL

## 2023-07-25 NOTE — Progress Notes (Signed)
 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  HISTORY OF PRESENTING ILLNESS:   Jesus [name] 68 y.o. male is here because of left lower extremity edema and lymphadenopathy.  The patient was seen in the emergency room this past Friday for the same issue.  A CT of the abdomen and pelvis was performed showing bulky left inguinal, left hemipelvic, and retroperitoneal adenopathy.  He was referred to [place] from the emergency room for further evaluation.  Doppler ultrasound of the left lower extremity was performed and was negative for DVT. Patient reports that he has been having left lower extremity edema in his left groin and left leg for approximately 1 month.  He states that the swelling in the left groin started to get better but then worsened.  The left lower extremity edema has slowly worsened over time.  Patient denies having fevers and chills.  He reports that he does have night sweats at times.  He reported having headaches approximate 1 [place]  He thinks his headaches are related to not having his blood pressure medication.  His headaches have now resolved.  He denies visual changes.  The patient denies chest pain, shortness of breath and cough.  No nausea, vomiting, constipation, diarrhea.  Denies abdominal pain.  The patient denies recent weight loss and has actually gained weight  recently.  Patient denies epistaxis, bleeding gums, hemoptysis, hematuria, but occasionally, and melena.  He reports increased urinary frequency over the past month but no dysuria.  The patient is here for evaluation and discussion of his recent CT and lab findings.  Interval History:   Jesus [name] 68 y.o. male who is here for continued evaluation and management of newly noted metastatic laryngeal squamous cell carcinoma.  Patient was last seen by me on 06/13/2023 and he complained of swallowing difficulties when drinking liquid and eating any type of food and fatigue/lethargy due to not eating or drinking well.   He has had 4-hospitalizations since our last visit: Patient was hospitalized from 06/15/2023 with difficulty swallowing liquids and solids. Chest X-Ray showed no active cardiopulmonary disease process, rightward tracheal deviation with left peritracheal opacity corresponding to likely mass seen on prior PET/CT. Soft tissue neck x-ray with slightly thickened appearance of the epiglottis and slightly hazy appearance of the glottic area suggesting mild edema or inflammation; possible mild narrowing of the subglottic trachea as well. CT soft tissue neck with circumferential soft tissue abnormality surrounding the trachea posteriorly displacing the esophagus to the level of the thoracic inlet. CT Angiogram did not show any abnormalities. He was started on empiric antibiotics with Vancomycin and Cefepime. Patient underwent laparoscopic insertion of Gastrostomy tube on 06/19/2023. Patient's G-tube became dislodged on 06/21/2023 and repeat laparoscopic insertion G-tube was inserted on 06/21/2023. Patient was discharged on 06/24/2023.   Patient was re-hospitalized on 07/02/2023 with Shortness of Breath, worsened for the past 2-3 days, and cough with large blood clots. Labs and scans showed acute  hypoxic respiratory failure on the basis of suspected COPD exacerbation, mildly elevated troponin. He was  started on Zosyn presumably for concern for pneumonia, discontinued on discharge. He was placed on BiPAP, which helped him cough all the phlegm. Patient was discharged on 07/05/2023 with oral steroids x 3 days.   Patient was admitted to the ED on 07/11/2023 with body weakness due to de-hydration. He was given IV Fluids and was discharged the same day.   Patient was re-hospitalized on 07/14/2023 with dyspnea due to tracheal narrowing, anemia, and underlying COPD. CTA chest did not show PE or Pneumonia. He was discharged on 07/17/2023 with steroids and antibiotics and was scheduled for esophagoscopy.    Patient is on wheel-chair during this visit. Patient notes he has not been doing well since our last visit. He complains of SOB and difficulty swallowing. He has been getting all his nutrition through the G-tube. He also complains of fatigue and weakness.  He denies any new infection issues, fever, chills, night sweats, abnormal bleeding issue, back pain, chest pain, abdominal pain, or leg swelling.  Patient has lost significant amount of weight since our last visit, but patient notes that his weight has been stable since the first week of March around 228 lbs.   Patient reports of skin rash at the site of G-tube. He has been applying lotion to the area.  Patient currently lives with his children: son and daughter.    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- last radiation 06-2018   Cataract    removed both eyes with l;ens implants    COPD (chronic obstructive pulmonary disease) (HCC)    Dyspnea    Family history of colon cancer    in his brother- dx'd age 15    History of chemotherapy    last 06-01-2018   History of kidney stones    History of radiation therapy    last  radiation 06-2018   Hyperlipidemia    currently under control   Hypertension    Irregular heart beats    Lymphadenopathy    Pain, lower leg    Bilateral   Peripheral arterial disease (HCC)    Pre-diabetes    Red-green color blindness    RLS (restless legs syndrome)    Snores    Wears glasses     SURGICAL HISTORY: Past Surgical History:  Procedure Laterality Date   CATARACT EXTRACTION W/ INTRAOCULAR LENS  IMPLANT, BILATERAL     COLONOSCOPY     DIRECT LARYNGOSCOPY Right 02/03/2022   Procedure: DIRECT LARYNGOSCOPY WITH BIOPSY OF RIGHT FALSE VOCAL CORD;  Surgeon: Osborn Coho, MD;  Location: [facility] OR;  Service: ENT;  Laterality: Right;   dislodged salava stone     ESOPHAGOSCOPY WITH DILITATION N/A 07/19/2023   Procedure: ESOPHAGOSCOPY, WITH DILATION;  Surgeon: Serena Colonel, MD;  Location: [facility] OR;  Service: ENT;  Laterality: N/A;   FRACTURE SURGERY     HAND ARTHROPLASTY  1995   crushed left hand   INGUINAL LYMPH NODE BIOPSY Left 01/02/2018   Procedure: LEFT INGUINAL LYMPH NODE BIOPSY;  Surgeon: Emelia Loron, MD;  Location: [facility] OR;  Service: General;  Laterality: Left;   IR IMAGING GUIDED PORT INSERTION  01/15/2018  IR IMAGING GUIDED PORT INSERTION  08/10/2022   IR REMOVAL TUN ACCESS W/ PORT W/O FL MOD SED  03/11/2019   IR REPLACE G-TUBE SIMPLE WO FLUORO  06/21/2023   LAPAROSCOPIC INSERTION GASTROSTOMY TUBE N/A 06/19/2023   Procedure: LAPAROSCOPIC INSERTION GASTROSTOMY TUBE;  Surgeon: Quentin Ore, MD;  Location: [facility] ORS;  Service: General;  Laterality: N/A;   LAPAROSCOPIC INSERTION GASTROSTOMY TUBE N/A 06/21/2023   Procedure: LAPAROSCOPIC INSERTION GASTROSTOMY TUBE;  Surgeon: Quentin Ore, MD;  Location: [facility] ORS;  Service: General;  Laterality: N/A;   MEDIASTINOSCOPY N/A 07/22/2022   Procedure: MEDIASTINOSCOPY;  Surgeon: Loreli Slot, MD;  Location: [facility] OR;  Service: Thoracic;  Laterality: N/A;   MICROLARYNGOSCOPY Left 01/17/2014   Procedure: MICROLARYNGOSCOPY WITH  EXCISION OF THE BIOPSY OF LEFT VOCAL CORD LESION;  Surgeon: Serena Colonel, MD;  Location: [facility];  Service: ENT;  Laterality: Left;   MICROLARYNGOSCOPY N/A 09/16/2020   Procedure: MICROLARYNGOSCOPY with Biopsy of vocal cord lesion;  Surgeon: Osborn Coho, MD;  Location: [facility] OR;  Service: ENT;  Laterality: N/A;   ORIF FOOT FRACTURE  2005   left   REFRACTIVE SURGERY Right    removed cloudiness in right eye after cataract removal     SOCIAL HISTORY: Social History   Socioeconomic History   Marital status: Divorced    Spouse name: Not on file   Number of children: 3   Years of education: Not on file   Highest education level: Not on file  Occupational History   Not on file  Tobacco Use   Smoking status: Former    Current packs/day: 0.00    Average packs/day: 0.5 packs/day for 36.0 years (18.0 ttl pk-yrs)    Types: Cigarettes    Start date: 09/28/1984    Quit date: 09/28/2020    Years since quitting: 2.8   Smokeless tobacco: Never   Tobacco comments:    he denies smoking in about 2 weeks 07/13/18  Vaping Use   Vaping status: Never Used  Substance and Sexual Activity   Alcohol use: Not Currently    Alcohol/week: 6.0 standard drinks of alcohol    Types: 6 Cans of beer per week    Comment: Last use: Friday 30th   Drug use: Yes    Types: Marijuana, Cocaine    Comment: "Edibles"   Sexual activity: Not Currently  Other Topics Concern   Not on file  Social History Narrative   Not on file   Social Drivers of Health   Financial Resource Strain: Low Risk  (10/06/2020)   Overall Financial Resource Strain (CARDIA)    Difficulty of Paying Living Expenses: Not very hard  Food Insecurity: No Food Insecurity (07/15/2023)   Hunger Vital Sign    Worried About Running Out of Food in the Last Year: Never true    Ran Out of Food in the Last Year: Never true  Transportation Needs: No Transportation Needs (07/15/2023)   PRAPARE - Administrator, Civil Service  (Medical): No    Lack of Transportation (Non-Medical): No  Physical Activity: Not on file  Stress: Not on file  Social Connections: Moderately Isolated (07/15/2023)   Social Connection and Isolation Panel [NHANES]    Frequency of Communication with Friends and Family: More than three times a week    Frequency of Social Gatherings with Friends and Family: Once a week    Attends Religious Services: Never    Database administrator or Organizations: Yes  Attends Banker Meetings: More than 4 times per year    Marital Status: Divorced  Intimate Partner Violence: Not At Risk (07/15/2023)   Humiliation, Afraid, Rape, and Kick questionnaire    Fear of Current or Ex-Partner: No    Emotionally Abused: No    Physically Abused: No    Sexually Abused: No    FAMILY HISTORY: Family History  Problem Relation Age of Onset   Breast cancer Mother    Diabetes Father    Hypertension Father    Stroke Father    Mental illness Sister    Hypertension Daughter    Mental illness Daughter    Hypertension Brother    Colon cancer Brother 58       passed away 2018/12/25   Breast cancer Sister    Esophageal cancer Neg Hx    Colon polyps Neg Hx    Rectal cancer Neg Hx    Stomach cancer Neg Hx     ALLERGIES:  is allergic to bee venom, lisinopril, antifungal [miconazole nitrate], and zolpidem tartrate er.  MEDICATIONS:  Current Outpatient Medications  Medication Sig Dispense Refill   acetaminophen (TYLENOL) 160 MG/5ML solution Place 20.3 mLs (650 mg total) into feeding tube every 6 (six) hours as needed for mild pain (pain score 1-3). 473 mL 0   albuterol (VENTOLIN HFA) 108 (90 Base) MCG/ACT inhaler Inhale 2 puffs into the lungs every 6 (six) hours as needed for wheezing or shortness of breath. 6.7 g 2   ferrous sulfate 220 (44 Fe) MG/5ML solution Place 6.8 mLs (300 mg total) into feeding tube every other day. 106 mL 0   glycopyrrolate (ROBINUL) 1 MG tablet Place 1 tablet (1 mg total) into  feeding tube every other day. Crush tablet, place in water, and then place in feeding tube.     lidocaine-prilocaine (EMLA) cream Apply to affected area once 30 g 3   Nutritional Supplements (NUTREN 1.5) LIQD Give 2 cartons 4 times a day.  Flush with 30ml of water before and after each feeding.  Give additional of water TID for additional hydration.     oxyCODONE (ROXICODONE) 5 MG/5ML solution Place 5 mLs (5 mg total) into feeding tube every 6 (six) hours as needed for breakthrough pain. 75 mL 0   pantoprazole (PROTONIX) 40 MG tablet Take 1 tablet (40 mg total) by mouth daily. 30 tablet 0   Protein (FEEDING SUPPLEMENT, PROSOURCE TF20,) liquid Place 60 mLs into feeding tube daily. 1800 mL 2   Water For Irrigation, Sterile (FREE WATER) SOLN Place 125 mLs into feeding tube every 4 (four) hours. (Patient taking differently: Place 60 mLs into feeding tube every 4 (four) hours. Use 30ml prior to feeding supplement, and 30ml after each feeding supplement)     No current facility-administered medications for this visit.   Facility-Administered Medications Ordered in Other Visits  Medication Dose Route Frequency Provider Last Rate Last Admin   sodium chloride flush (NS) 0.9 % injection 10 mL  10 mL Intracatheter PRN Johney Maine, MD   10 mL at 06/13/23 1745    REVIEW OF SYSTEMS:   10 Point review of Systems was done is negative except as noted above.  PHYSICAL EXAMINATION: .BP 139/78 (BP Location: Left Arm, Patient Position: Sitting)   Pulse 63   Temp 97.9 F (36.6 C) (Temporal)   Resp 19   Ht 6' (1.829 m)   Wt 228 lb 1.6 oz (103.5 kg)   SpO2 97%   BMI 30.94  kg/m   GENERAL:alert, in no acute distress and comfortable SKIN: no acute rashes, no significant lesions EYES: conjunctiva are pink and non-injected, sclera anicteric OROPHARYNX: MMM, no exudates, no oropharyngeal erythema or ulceration NECK: supple, no JVD LYMPH:  no palpable lymphadenopathy in the cervical, axillary or  inguinal regions LUNGS: clear to auscultation b/l with normal respiratory effort HEART: regular rate & rhythm ABDOMEN:  normoactive bowel sounds , non tender, not distended. Extremity: no pedal edema PSYCH: alert & oriented x 3 with fluent speech NEURO: no focal motor/sensory deficits   LABORATORY DATA:   Lab Results  Component Value Date   LDH 168 05/18/2022   Tsh 1.42 (05/21/2021) Component     Latest Ref Rng 07/25/2023  WBC     4.0 - 10.5 K/uL 12.3 (H)   RBC     4.22 - 5.81 MIL/uL 3.44 (L)   Hemoglobin     13.0 - 17.0 g/dL 8.4 (L)   HCT     29.5 - 52.0 % 27.3 (L)   MCV     80.0 - 100.0 fL 79.4 (L)   MCH     26.0 - 34.0 pg 24.4 (L)   MCHC     30.0 - 36.0 g/dL 62.1   RDW     30.8 - 65.7 % 16.1 (H)   Platelets     150 - 400 K/uL 514 (H)   nRBC     0.0 - 0.2 % 0.0   Neutrophils     % 74   NEUT#     1.7 - 7.7 K/uL 9.1 (H)   Lymphocytes     % 16   Lymphs Abs     0.7 - 4.0 K/uL 1.9   Monocytes Relative     % 9   Monocyte #     0.1 - 1.0 K/uL 1.1 (H)   Eosinophil     % 1   Eosinophils Absolute     0.0 - 0.5 K/uL 0.1   Basophil     % 0   Basophils Absolute     0.0 - 0.1 K/uL 0.1   Immature Granulocytes     % 0   Abs Immature Granulocytes     0.00 - 0.07 K/uL 0.03   Sodium     135 - 145 mmol/L 129 (L)   Potassium     3.5 - 5.1 mmol/L 4.8   Chloride     98 - 111 mmol/L 86 (L)   CO2     22 - 32 mmol/L 37 (H)   Glucose     70 - 99 mg/dL 846 (H)   BUN     8 - 23 mg/dL 27 (H)   Creatinine     0.61 - 1.24 mg/dL 9.62   Calcium     8.9 - 10.3 mg/dL 95.2   Total Protein     6.5 - 8.1 g/dL 7.9   Albumin     3.5 - 5.0 g/dL 3.7   AST     15 - 41 U/L 9 (L)   ALT     0 - 44 U/L 11   Alkaline Phosphatase     38 - 126 U/L 99   Total Bilirubin     0.0 - 1.2 mg/dL 0.4   GFR, Est Non African American     >60 mL/min >60   Anion gap     5 - 15  6   Iron     45 -  182 ug/dL 11 (L)   TIBC     161 - 450 ug/dL 096   Saturation Ratios     17.9 - 39.5 % 4  (L)   UIBC     117 - 376 ug/dL 045   Thyroxine (T4)     4.5 - 12.0 ug/dL 6.6   TSH     4.098 - 1.191 uIU/mL 16.681 (H)   Vitamin B12     180 - 914 pg/mL 750   Ferritin     24 - 336 ng/mL 305     Legend: (H) High (L) Low  01/02/18 Left Inguinal LN Bx:   12/26/17 Tissue Flow Cytometry:   12/26/17 Inguinal Core biopsy:      RADIOGRAPHIC STUDIES: I have personally reviewed the radiological images as listed and agreed with the findings in the report. CT Angio Chest PE W and/or Wo Contrast Result Date: 07/15/2023 CLINICAL DATA:  Multiple suspected pulmonary embolism. EXAM: CT ANGIOGRAPHY CHEST WITH CONTRAST TECHNIQUE: Multidetector CT imaging of the chest was performed using the standard protocol during bolus administration of intravenous contrast. Multiplanar CT image reconstructions and MIPs were obtained to evaluate the vascular anatomy. RADIATION DOSE REDUCTION: This exam was performed according to the departmental dose-optimization program which includes automated exposure control, adjustment of the mA and/or kV according to patient size and/or use of iterative reconstruction technique. CONTRAST:  75mL OMNIPAQUE IOHEXOL 350 MG/ML SOLN COMPARISON:  July 02, 2023 FINDINGS: Cardiovascular: A right-sided venous Port-A-Cath is seen. Satisfactory opacification of the pulmonary arteries to the segmental level. No evidence of pulmonary embolism. Normal heart size. No pericardial effusion. Mediastinum/Nodes: No enlarged mediastinal, hilar, or axillary lymph nodes. A stable appearing area of soft tissue attenuation is seen at the thoracic inlet. This extends into the superior mediastinum and subsequently surrounds the trachea and esophagus. Tracheal narrowing is again seen at the level of the thoracic inlet. Lungs/Pleura: There is no evidence of acute infiltrate, pleural effusion or pneumothorax. Upper Abdomen: A partially visualized percutaneous gastrostomy tube is noted. There are large  bilateral simple renal cysts. Musculoskeletal: No chest wall abnormality. No acute or significant osseous findings. Review of the MIP images confirms the above findings. IMPRESSION: 1. No evidence of pulmonary embolism or other acute intrathoracic process. 2. Stable findings consistent with the patient's known laryngeal cancer. 3. Large bilateral simple renal cysts. No follow-up imaging is recommended. This recommendation follows ACR consensus guidelines: Management of the Incidental Renal Mass on CT: A White Paper of the ACR Incidental Findings Committee. J Am Coll Radiol [phone]. Electronically Signed   By: Aram Candela M.D.   On: 07/15/2023 01:35   DG Chest Portable 1 View Result Date: 07/14/2023 CLINICAL DATA:  Worsening shortness of breath. EXAM: PORTABLE CHEST 1 VIEW COMPARISON:  July 02, 2023 FINDINGS: There is stable right-sided venous Port-A-Cath positioning. The heart size and mediastinal contours are within normal limits. There is mild right infrahilar atelectasis and/or infiltrate. No pleural effusion or pneumothorax is identified. Multilevel degenerative changes are seen throughout the thoracic spine. IMPRESSION: Mild right infrahilar atelectasis and/or infiltrate. Electronically Signed   By: Aram Candela M.D.   On: 07/14/2023 23:41   ECHOCARDIOGRAM COMPLETE Result Date: 07/03/2023    ECHOCARDIOGRAM REPORT   Patient Name:   KYLLE LALL Date of Exam: 07/03/2023 Medical Rec #:  [id number]      Height:       72.0 in Accession #:    [id number]     Weight:  242.5 lb Date of Birth:  21-May-1955     BSA:          2.312 m Patient Age:    33 years       BP:           117/69 mmHg Patient Gender: M              HR:           96 bpm. Exam Location:  [facility] Procedure: 2D Echo, Cardiac Doppler and Color Doppler (Both Spectral and Color            Flow Doppler were utilized during procedure). Indications:    Elevated Troponin  History:        Patient has prior history of  Echocardiogram examinations, most                 recent 09/20/2021. COPD, Signs/Symptoms:Shortness of Breath; Risk                 Factors:Hypertension, Diabetes, Current Smoker and Dyslipidemia.  Sonographer:    Lucendia Herrlich RCS Referring Phys: [id number] JUSTIN B HOWERTER IMPRESSIONS  1. Left ventricular ejection fraction, by estimation, is 65 to 70%. The left ventricle has normal function. The left ventricle has no regional wall motion abnormalities. There is mild concentric left ventricular hypertrophy. Left ventricular diastolic parameters are consistent with Grade I diastolic dysfunction (impaired relaxation).  2. Right ventricular systolic function is normal. The right ventricular size is normal.  3. The mitral valve is normal in structure. No evidence of mitral valve regurgitation. No evidence of mitral stenosis.  4. The aortic valve is normal in structure. Aortic valve regurgitation is not visualized. No aortic stenosis is present.  5. The inferior vena cava is normal in size with greater than 50% respiratory variability, suggesting right atrial pressure of 3 mmHg. FINDINGS  Left Ventricle: Left ventricular ejection fraction, by estimation, is 65 to 70%. The left ventricle has normal function. The left ventricle has no regional wall motion abnormalities. Strain imaging was not performed. The left ventricular internal cavity  size was normal in size. There is mild concentric left ventricular hypertrophy. Left ventricular diastolic parameters are consistent with Grade I diastolic dysfunction (impaired relaxation). Right Ventricle: The right ventricular size is normal. No increase in right ventricular wall thickness. Right ventricular systolic function is normal. Left Atrium: Left atrial size was normal in size. Right Atrium: Right atrial size was normal in size. Pericardium: There is no evidence of pericardial effusion. Mitral Valve: The mitral valve is normal in structure. No evidence of mitral valve  regurgitation. No evidence of mitral valve stenosis. Tricuspid Valve: The tricuspid valve is normal in structure. Tricuspid valve regurgitation is trivial. No evidence of tricuspid stenosis. Aortic Valve: The aortic valve is normal in structure. Aortic valve regurgitation is not visualized. No aortic stenosis is present. Aortic valve peak gradient measures 11.0 mmHg. Pulmonic Valve: The pulmonic valve was normal in structure. Pulmonic valve regurgitation is trivial. No evidence of pulmonic stenosis. Aorta: The aortic root is normal in size and structure. Venous: The inferior vena cava is normal in size with greater than 50% respiratory variability, suggesting right atrial pressure of 3 mmHg. IAS/Shunts: No atrial level shunt detected by color flow Doppler. Additional Comments: 3D imaging was not performed.  LEFT VENTRICLE PLAX 2D LVIDd:         5.20 cm   Diastology LVIDs:         3.00 cm  LV e' medial:    6.42 cm/s LV PW:         1.10 cm   LV E/e' medial:  12.2 LV IVS:        1.00 cm   LV e' lateral:   7.62 cm/s LVOT diam:     2.20 cm   LV E/e' lateral: 10.3 LV SV:         67 LV SV Index:   29 LVOT Area:     3.80 cm  RIGHT VENTRICLE             IVC RV S prime:     17.40 cm/s  IVC diam: 1.90 cm TAPSE (M-mode): 2.5 cm LEFT ATRIUM             Index        RIGHT ATRIUM           Index LA diam:        3.50 cm 1.51 cm/m   RA Area:     21.60 cm LA Vol (A2C):   51.7 ml 22.36 ml/m  RA Volume:   61.10 ml  26.42 ml/m LA Vol (A4C):   50.5 ml 21.84 ml/m LA Biplane Vol: 53.3 ml 23.05 ml/m  AORTIC VALVE AV Area (Vmax): 2.61 cm AV Vmax:        166.00 cm/s AV Peak Grad:   11.0 mmHg LVOT Vmax:      114.00 cm/s LVOT Vmean:     74.500 cm/s LVOT VTI:       0.176 m  AORTA Ao Root diam: 3.70 cm Ao Asc diam:  3.60 cm MITRAL VALVE MV Area (PHT): 5.54 cm     SHUNTS MV Decel Time: 137 msec     Systemic VTI:  0.18 m MV E velocity: 78.40 cm/s   Systemic Diam: 2.20 cm MV A velocity: 111.00 cm/s MV E/A ratio:  0.71 Arvilla Meres MD  Electronically signed by Arvilla Meres MD Signature Date/Time: 07/03/2023/5:40:02 PM    Final    CT Angio Chest PE W/Cm &/Or Wo Cm Result Date: 07/02/2023 CLINICAL DATA:  Pulmonary embolism (PE) suspected, high prob. Shortness of breath EXAM: CT ANGIOGRAPHY CHEST WITH CONTRAST TECHNIQUE: Multidetector CT imaging of the chest was performed using the standard protocol during bolus administration of intravenous contrast. Multiplanar CT image reconstructions and MIPs were obtained to evaluate the vascular anatomy. RADIATION DOSE REDUCTION: This exam was performed according to the departmental dose-optimization program which includes automated exposure control, adjustment of the mA and/or kV according to patient size and/or use of iterative reconstruction technique. CONTRAST:  75mL OMNIPAQUE IOHEXOL 350 MG/ML SOLN COMPARISON:  06/15/2023 FINDINGS: Cardiovascular: No filling defects in the pulmonary arteries to suggest pulmonary emboli. Heart is normal size. Aorta is normal caliber. Mediastinum/Nodes: Abnormal soft tissue at the thoracic inlet and extending into the upper mediastinum surrounding the trachea and esophagus corresponding to known laryngeal cancer. This narrows the trachea at the level of the thoracic inlet. Findings are similar to prior study. No adenopathy. Lungs/Pleura: Lungs are clear. No focal airspace opacities or suspicious nodules. No effusions. Upper Abdomen: No acute findings Musculoskeletal: Right upper chest wall Port-A-Cath remains in place. No acute bony abnormality. Review of the MIP images confirms the above findings. IMPRESSION: No evidence of pulmonary embolus. Abnormal soft tissue at the thoracic inlet and upper mediastinum surrounding the trachea and esophagus compatible with patient's known cancer. This narrows the tracheal lumen significantly at the level of the thoracic inlet. Electronically Signed  By: Charlett Nose M.D.   On: 07/02/2023 21:06   DG Chest Port 1 View Result  Date: 07/02/2023 CLINICAL DATA:  Shortness of breath EXAM: PORTABLE CHEST 1 VIEW COMPARISON:  06/15/2023 FINDINGS: Lungs are clear.  No pleural effusion or pneumothorax. The heart is normal in size. Right chest power port terminates in the lower SVC. IMPRESSION: No acute cardiopulmonary disease. Electronically Signed   By: Charline Bills M.D.   On: 07/02/2023 20:10   VAS [place] ABI WITH/WO TBI Result Date: 06/28/2023  LOWER EXTREMITY DOPPLER STUDY Patient Name:  TRENTIN KNAPPENBERGER  Date of Exam:   06/26/2023 Medical Rec #: [id number]       Accession #:    [id number] Date of Birth: May 13, 1955      Patient Gender: M Patient Age:   29 years Exam Location:  [facility] Procedure:      VAS [place] ABI WITH/WO TBI Referring Phys: --------------------------------------------------------------------------------  Indications: Patient complians of numbness in both feet. He also has restless              leg syndrome which is worse at night. He denies claudication and              rest pain symptoms. He is not able to walk for long periods due to              SOB. High Risk         Hypertension, hyperlipidemia, Diabetes, past history of Factors:          smoking.  Comparison Study: Prior ABI 04/29/2014 right 1.13 left 1.42 Performing Technologist: Carlos American RVT  Examination Guidelines: A complete evaluation includes at minimum, Doppler waveform signals and systolic blood pressure reading at the level of bilateral brachial, anterior tibial, and posterior tibial arteries, when vessel segments are accessible. Bilateral testing is considered an integral part of a complete examination. Photoelectric Plethysmograph (PPG) waveforms and toe systolic pressure readings are included as required and additional duplex testing as needed. Limited examinations for reoccurring indications may be performed as noted.  ABI Findings: +---------+------------------+-----+---------+--------+ Right    Rt Pressure (mmHg)IndexWaveform Comment   +---------+------------------+-----+---------+--------+ Brachial 130                                      +---------+------------------+-----+---------+--------+ PTA      166               1.28 triphasic         +---------+------------------+-----+---------+--------+ DP       152               1.17 triphasic         +---------+------------------+-----+---------+--------+ Great Toe112               0.86 Normal            +---------+------------------+-----+---------+--------+ +---------+------------------+-----+---------+-------+ Left     Lt Pressure (mmHg)IndexWaveform Comment +---------+------------------+-----+---------+-------+ Brachial 129                                     +---------+------------------+-----+---------+-------+ PTA      166               1.28 triphasic        +---------+------------------+-----+---------+-------+ DP       179  1.38 triphasic        +---------+------------------+-----+---------+-------+ Great Toe156               1.20 Normal           +---------+------------------+-----+---------+-------+ +-------+-----------+-----------+------------+------------+ ABI/TBIToday's ABIToday's TBIPrevious ABIPrevious TBI +-------+-----------+-----------+------------+------------+ Right  1.26       .86        1.13                     +-------+-----------+-----------+------------+------------+ Left   1.38       1.2        1.42                     +-------+-----------+-----------+------------+------------+  Bilateral ABIs appear essentially unchanged compared to prior study on 04/29/2014.  Summary: Right: Resting right ankle-brachial index is within normal range. The right toe-brachial index is normal. Left: Resting left ankle-brachial index indicates noncompressible left lower extremity arteries. The left toe-brachial index is normal. *See table(s) above for measurements and observations.  Electronically signed by  Lorine Bears MD on 06/28/2023 at 1:40:58 PM.    Final      ASSESSMENT & PLAN:   This is a pleasant 68 y.o. 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   CT of the abdomen and pelvis performed on 12/22/2017 showed bulky left inguinal, left hemipelvic, and  retroperitoneal adenopathy.     01/02/18 Left inguinal LN Biopsy revealed T-Cell/histocyte rich Large B-Cell Lymphoma   12/27/17 ECHO revealed LV EF of 55-60%    01/05/18 PET/CT revealed Massively enlarged pelvic lymph nodes intense metabolic activity consistent lymphoma. 2. Additional hypermetabolic lymph nodes in the porta hepatis and retroperitoneum LEFT aorta. 3. Solitary hypermetabolic mediastinal lymph node in the upper LEFT Mediastinum. 4. 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 5. Normal spleen.     HIV non reactive on 12/22/2017.  Hep C and hep B serology negative.   03/14/18 PET/CT revealed PET-CT findings suggest an excellent response to chemotherapy. The abdominal lymphadenopathy has near completely resolved and demonstrates a near complete metabolic response. The pelvic and inguinal adenopathy has significantly decreased in size and the metabolic activity has significantly decreased. 2. Diffuse marrow activity likely due to chemotherapy and or marrow stimulating drugs. I do not see any discrete persistent lesions.    04/17/18 CT Head revealed Subtle mesial caudothalamic hypodensities may be artifact though, the could reflect encephalitis or Wernicke's encephalopathy. Consider MRI of the head with and without contrast. 2. Mild chronic small vessel ischemic changes    S/p 6 cycles of EPOCH-R completed on 06/01/18  06/28/18 PET/CT  revealed Continued good response to treatment. No residual measurable or hypermetabolic abdominal lymphadenopathy and no recurrent osseous disease. 2. Interval decrease in size of the left operator region lymph node and also the left inguinal lymph node. However, the both have small foci of slightly increased hypermetabolism which bears surveillance. 3. No new or progressive lymphadenopathy in the neck, chest, abdomen or pelvis.  S/p 39.6 Gy in 22 fractions between 07/25/18 and 08/23/18  02/13/2019 CT C/A/P ([id number]) ([id number]) revealed "1. Response to therapy of pelvic adenopathy compared to the PET of 06/28/2018. 2. No new or progressive disease. 3. Mild prostatomegaly. 4. 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 5. Hepatomegaly."   2) left lower extremity swelling- now resolved  Doppler ultrasound for DVT was negative in the left lower extremity.   Likely from venous compression +/- lymphatic obstruction from bulky left inguinal, left hemipelvic, and  retroperitoneal adenopathy.    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  PLAN: -Discussed lab results from today, 07/25/2023, in detail with the patient. CBC shows elevated WBC of 12.3 K, low Hgb of 8.4 g/dL with Hct of 62.1%, and elevated Platelets of 514 K. CMP shows elevated Bun of 27, low sodium level of 129, and low AST level of 9.  -Discussed with the patient that the tumor mass pushing the food pipe ans trachea.  -Discussed with the patient that there might be a chance for radiation treatment and then we would change Keytruda treatment. -Patient  wants to continue with treatment options.  -Had a goal of care discussion with the patient.  -Patient can proceed with [facility] treatment today.  -Patient will get IV Fluid today.  -Will refer the patient to Radiation Oncologist. -Discussed with the patient that we will change Keytruda treatment after he completes radiation treatment likely to Plantinum based ctx + Cetuximab. -He will have 4 fractions over 2 days of radiation treatment.  -Discussed the option of PET scan in 4 weeks. Pt agrees.  -Answered all of patient's questions.   FOLLOW-UP: Pet/ct in 3-4 weeks RTC with Dr Candise Che with labs in 1 months  The total time spent in the appointment was 30 minutes* .  All of the patient's questions were answered with apparent satisfaction. The patient knows to call the clinic with any problems, questions or concerns.   Wyvonnia Lora MD MS AAHIVMS [facility] Hematology/Oncology Physician [facility]  .*Total Encounter Time as defined by the Centers for Medicare and Medicaid Services includes, in addition to the face-to-face time of a patient visit (documented in the note above) non-face-to-face time: obtaining and reviewing outside history, ordering and reviewing medications, tests or procedures, care coordination (communications with other health care professionals or caregivers) and documentation in the medical record.   I,Param Shah,acting as a Neurosurgeon for Wyvonnia Lora, MD.,have documented all relevant documentation on the behalf of Wyvonnia Lora, MD,as directed by  Wyvonnia Lora, MD while in the presence of Wyvonnia Lora, MD.  .I have reviewed the above documentation for accuracy and completeness, and I agree with the above. Johney Maine MD

## 2023-07-25 NOTE — Patient Instructions (Signed)
 [facility] - A DEPT OF [facility]  Discharge Instructions: Thank you for choosing [facility] to provide your oncology and hematology care.   If you have a lab appointment with the Cancer Center, please go directly to the Cancer Center and check in at the registration area.   Wear comfortable clothing and clothing appropriate for easy access to any Portacath or PICC line.   We strive to give you quality time with your provider. You may need to reschedule your appointment if you arrive late (15 or more minutes).  Arriving late affects you and other patients whose appointments are after yours.  Also, if you miss three or more appointments without notifying the office, you may be dismissed from the clinic at the provider's discretion.      For prescription refill requests, have your pharmacy contact our office and allow 72 hours for refills to be completed.    Today you received the following chemotherapy and/or immunotherapy agents: Keytruda      To help prevent nausea and vomiting after your treatment, we encourage you to take your nausea medication as directed.  BELOW ARE SYMPTOMS THAT SHOULD BE REPORTED IMMEDIATELY: *FEVER GREATER THAN 100.4 F (38 C) OR HIGHER *CHILLS OR SWEATING *NAUSEA AND VOMITING THAT IS NOT CONTROLLED WITH YOUR NAUSEA MEDICATION *UNUSUAL SHORTNESS OF BREATH *UNUSUAL BRUISING OR BLEEDING *URINARY PROBLEMS (pain or burning when urinating, or frequent urination) *BOWEL PROBLEMS (unusual diarrhea, constipation, pain near the anus) TENDERNESS IN MOUTH AND THROAT WITH OR WITHOUT PRESENCE OF ULCERS (sore throat, sores in mouth, or a toothache) UNUSUAL RASH, SWELLING OR PAIN  UNUSUAL VAGINAL DISCHARGE OR ITCHING   Items with * indicate a potential emergency and should be followed up as soon as possible or go to the Emergency Department if any problems should occur.  Please show the CHEMOTHERAPY ALERT CARD or IMMUNOTHERAPY  ALERT CARD at check-in to the Emergency Department and triage nurse.  Should you have questions after your visit or need to cancel or reschedule your appointment, please contact [facility] - A DEPT OF [facility]  Dept: [phone]  and follow the prompts.  Office hours are 8:00 a.m. to 4:30 p.m. Monday - Friday. Please note that voicemails left after 4:00 p.m. may not be returned until the following business day.  We are closed weekends and major holidays. You have access to a nurse at all times for urgent questions. Please call the main number to the clinic Dept: [phone] and follow the prompts.   For any non-urgent questions, you may also contact your provider using MyChart. We now offer e-Visits for anyone 77 and older to request care online for non-urgent symptoms. For details visit [email or web address].   Also download the MyChart app! Go to the app store, search "MyChart", open the app, select , and log in with your MyChart username and password.  Rehydration, Older Adult  Rehydration is the replacement of fluids, salts, and minerals in the body (electrolytes) that are lost during dehydration. Dehydration is when there is not enough water or other fluids in the body. This happens when you lose more fluids than you take in. People who are age 26 or older have a higher risk of dehydration than younger adults. This is because in older age, the body: Is less able to maintain the right amount of water. Does not respond to temperature changes as well. Does not get  a sense of thirst as easily or quickly. Other causes include: Not drinking enough fluids. This can occur when you are ill, when you forget to drink, or when you are doing activities that require a lot of energy, especially in hot weather. Conditions that cause loss of water or other fluids. These include diarrhea, vomiting, sweating, or urinating a lot. Other illnesses, such as fever  or infection. Certain medicines, such as those that remove excess fluid from the body (diuretics). Symptoms of mild or moderate dehydration may include thirst, dry lips and mouth, and dizziness. Symptoms of severe dehydration may include increased heart rate, confusion, fainting, and not urinating. In severe cases, you may need to get fluids through an IV at the hospital. For mild or moderate cases, you can usually rehydrate at home by drinking certain fluids as told by your health care provider. What are the risks? Rehydration is usually safe. Taking in too much fluid (overhydration) can be a problem but is rare. Overhydration can cause an imbalance of electrolytes in the body, kidney failure, fluid in the lungs, or a decrease in salt (sodium) levels in the body. Supplies needed: You will need an oral rehydration solution (ORS) if your health care provider tells you to use one. This is a drink to treat dehydration. It can be found in pharmacies and retail stores. How to rehydrate Fluids Follow instructions from your health care provider about what to drink. The kind of fluid and the amount you should drink depend on your condition. In general, you should choose drinks that you prefer. If told by your health care provider, drink an ORS. Make an ORS by following instructions on the package. Start by drinking small amounts, about  cup (120 mL) every 5-10 minutes. Slowly increase how much you drink until you have taken in the amount recommended by your health care provider. Drink enough clear fluids to keep your urine pale yellow. If you were told to drink an ORS, finish it first, then start slowly drinking other clear fluids. Drink fluids such as: Water. This includes sparkling and flavored water. Drinking only water can lead to having too little sodium in your body (hyponatremia). Follow the advice of your health care provider. Water from ice chips you suck on. Fruit juice with water added to  it(diluted). Sports drinks. Hot or cold herbal teas. Broth-based soups. Coffee. Milk or milk products. Food Follow instructions from your health care provider about what to eat while you rehydrate. Your health care provider may recommend that you slowly begin eating regular foods in small amounts. Eat foods that contain a healthy balance of electrolytes, such as bananas, oranges, potatoes, tomatoes, and spinach. Avoid foods that are greasy or contain a lot of sugar. In some cases, you may get nutrition through a feeding tube that is passed through your nose and into your stomach (nasogastric tube, or NG tube). This may be done if you have uncontrolled vomiting or diarrhea. Drinks to avoid  Certain drinks may make dehydration worse. While you rehydrate, avoid drinking alcohol. How to tell if you are recovering from dehydration You may be getting better if: You are urinating more often than before you started rehydrating. Your urine is pale yellow. Your energy level improves. You vomit less often. You have diarrhea less often. Your appetite improves or returns to normal. You feel less dizzy or light-headed. Your skin tone and color start to look more normal. Follow these instructions at home: Take over-the-counter and prescription medicines only as  told by your health care provider. Do not take sodium tablets. Doing this can lead to having too much sodium in your body (hypernatremia). Contact a health care provider if: You continue to have symptoms of mild or moderate dehydration, such as: Thirst. Dry lips. Slightly dry mouth. Dizziness. Dark urine or less urine than usual. Muscle cramps. You continue to vomit or have diarrhea. Get help right away if: You have symptoms of dehydration that get worse. You have a fever. You have a severe headache. You have been vomiting and have problems, such as: Your vomiting gets worse. Your vomit includes blood or green matter (bile). You  cannot eat or drink without vomiting. You have problems with urination or bowel movements, such as: Diarrhea that gets worse. Blood in your stool (feces). This may cause stool to look black and tarry. Not urinating, or urinating only a small amount of very dark urine, within 6-8 hours. You have trouble breathing. You have symptoms that get worse with treatment. These symptoms may be an emergency. Get help right away. Call 911. Do not wait to see if the symptoms will go away. Do not drive yourself to the hospital. This information is not intended to replace advice given to you by your health care provider. Make sure you discuss any questions you have with your health care provider. Document Revised: 09/08/2021 Document Reviewed: 09/06/2021 Elsevier Patient Education  2024 [employer].

## 2023-07-25 NOTE — Progress Notes (Signed)
 Nutrition Follow-up:  Pt with metastatic laryngeal SCC s/p treatment + large B-cell lymphoma (2019). Pt is currently receiving keytruda q21d.   Recent admissions: 2/6-2/15 - dysphagia s/p Gtube on 2/12 2/23-2/26 - acute respiratory failure with hypoxia 3/7-3/10 - acute hypoxic respiratory failure 3/12 - EGD with dilation  Met with pt in infusion. He reports feeling weak and has little energy. Pt is solely relying on tube for nutrition/hydration needs. Pt reports tolerating tube feedings at goal (8 cartons Nutren 1.5).  Pt reports spending $300 on protein modular as his insurance does not cover this. Reports flushing with 60 ml water each bolus. Pt is giving additional 2 cups water 6x/day. Pt states that is what they told me to do. Pt unable to recall who instructed him to do this.   Medications: reviewed   Labs: Na 129, glucose 352, BUN 27  Anthropometrics: Wt 228 lb 1.6 oz today   2/12 - 236 lb 12.4 oz  1/6 - 257 lb 12.8 oz   Estimated Energy Needs  Kcals: 2725-3270 Protein: 109-163 Fluid: >/= 2.7  NUTRITION DIAGNOSIS: Inadequate oral intake continues - pt relying on tube    MALNUTRITION DIAGNOSIS: Severe malnutrition continues    INTERVENTION:  Reviewed nutrition prescription with pt - 2 cartons Nutren 1.5 QID. Flush tube with 30 ml water before/after bolus. Give additional 360 ml (1.5 c.) water TID via tube to meet hydration needs. Provides 3000 kcal, 136 g protein, 2848 ml total water Instructed not to order additional protein modular - meeting 100% needs with regimen Recommend checking HgbA1c given trends - infusion RN to obtain labs during infusion Support and encouragement     MONITORING, EVALUATION, GOAL: wt trends, intake   NEXT VISIT: Tuesday April 8 during infusion

## 2023-07-25 NOTE — Progress Notes (Signed)
 Per Dr. Candise Che- ok to run fluids concurrently with [facility].

## 2023-07-25 NOTE — Progress Notes (Signed)
 Patient seen by Dr. Addison Naegeli are within treatment parameters.  Labs reviewed: and are not all within treatment parameters.   Dr Candise Che aware glucose 352  Per physician team, patient is ready for treatment. Please note that modifications are being made to the treatment plan including    Pt to get 1 Liter of NS with tx

## 2023-07-26 LAB — FERRITIN: Ferritin: 305 ng/mL (ref 24–336)

## 2023-07-27 LAB — T4: T4, Total: 6.6 ug/dL (ref 4.5–12.0)

## 2023-07-27 NOTE — Progress Notes (Signed)
 Radiation Oncology         [phone] ________________________________  Outpatient Re-Consultation  Name: Jesus [name] MRN: [id number]  Date: 07/28/2023  DOB: 10-02-1955  WU:[id number], Tawni Pummel, MD  Johney Maine, MD   REFERRING PHYSICIAN: Johney Maine, MD  DIAGNOSIS: No diagnosis found.   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   CHIEF COMPLAINT: Here to discuss management of metastatic laryngeal cancer  HISTORY OF PRESENT ILLNESS::Jesus [name] is a 68 y.o. male who returns to [place] today for consideration of further radiation therapy in management of his ***. He was last seen here for follow-up on 04/18/23 for a routine follow up visit after completing radiation to the sites of metastatic disease in the right neck and mediastinum. He was doing well at that time and his recent PET scan was reviewed from 04/14/23 which showed an excellent treatment response in the right neck, treatment changes to the left paratracheal mass, and no new evidence of metastatic disease.   Since he was last seen, the patient continued to receive immunotherapy with pembrolizumab under Dr. Candise Che with good tolerance.   He however began to develop progressive dysphagia with subsequent weight loss due to troubles eating this past January and was subsequently re-referred to Dr. Pollyann Kennedy on 06/01/23  for further evaluation and management. A laryngoscopy as performed at that time and revealed a fixed left vocal cord and impaired mobility of the right vocal cord. No obvious mucosal mass was identified although diffuse supraglottic edema was appreciated. Dr. Pollyann Kennedy ultimately recommended esophageal dilatation to address his dysphagia which the patient was agreeable to (later performed on 03/12 as noted below). A barium swallow study was also attempted on 06/09/23 which was unsuccessful.   Before he could receive further management, the patient was hospitalized on 06/15/23 after presenting to the ED due to not being able to eat or drink anything for 3 days due to progressive dysphagia. A soft tissue neck CT was subsequently performed at that time which showed the circumferential soft tissue abnormality surrounding the trachea and posteriorly displacing the esophagus to the level of the thoracic inlet, consistent with progressive glottic squamous cell carcinoma. A CTA of the chest was also performed and redemonstrated the esophageal mass at the level of the thoracic inlet, displacing the trachea, and an irregular appearing tracheal wall likely representing direct tumor invasion. CT findings also included nonspecific mediastinal lymphadenopathy concerning for potential metastatic disease. No evidence of PE was demonstrated. General surgery was ultimately consulted and recommended laparoscopic insertion of gastrostomy tube which was performed by Dr. Dossie Der on 06/19/2023.  His G-tube unfortunately became dislodged on 06/21/2023 requiring repeat G-tube insertion on 06/21/2023, and he was promptly started on tube feeds with Osmolite.   He was discharged from the hospital on 02/15 but was then again readmitted on 07/02/23 after presented to the ED that day with c/o SOB x 2-3 days and new onset  hemoptysis which began earlier that day. A chest CTA was subsequently performed which showed no evidence of PE or  pneumothorax. He was started on Zosyn upon admission due to concerns of pneumonia which was soon after discontinued due to resolution of hemoptysis and lack of associated symptoms . He was also initially hypoxic prior to being admitted which resolved after his cough/hemoptysis resolved (encounter notes indicate that he was placed on BiPAP which allowed him to cough up some phlegm in his throat). His symptoms eventually improved and he was discharged hom on 02/26 with 3 days of steroids.   He was again hospitalized from 03/07 through 03/10 after presenting with c/o dyspnea. A CTA of the chest was again performed upon admission which showed no evidence of PNA or other acute findings, and stable interval findings pertaining to his laryngeal cancer. He was however wheezing upon admission which prompted him to be started on steroids and abx for suspected COPD exacerbation.   After being discharged, he was able to proceed with esophageal dilation on 07/19/23 under Dr. Pollyann Kennedy as scheduled.  He most recently followed up with Dr. Candise Che on 07/25/23 to discuss further management options. To help with his dysphagia (resulting from esophogeal and tracheal compression from his tumor), Dr. Candise Che has recommended radiation therapy which we will discuss in detail today. Dr. Candise Che has also recommended trying an alternative form of immunotherapy after he completes radiation therapy. In the mean time he will continue to receive pembrolizumab.  Swallowing issues, if any: developed progressive dysphagia secondary to tumor compression; required g-tube placement on 02/10 and 02/12 (due to displacement requiring re-insertion), and esophageal dilation on 03/12 with Dr. 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   PREVIOUS RADIATION THERAPY: Yes   3) First Treatment Date: 2022-11-29 - Last Treatment Date: 2023-01-13  Plan Name: HN_NckChst (right neck and upper thoracic adenopathy treated) Site: Neck Technique: IMRT Mode: Photon Dose Per Fraction: 2 Gy Prescribed Dose (Delivered / Prescribed): 60 Gy / 60 Gy Prescribed Fxs (Delivered / Prescribed): 30 / 30     2) Diagnoses: C32.0-Malignant neoplasm of glottis  Intent: Curative  Radiation Treatment Dates: 10/15/2020 through 11/25/2020 Site Technique Total Dose (Gy) Dose per Fx (Gy) Completed Fx Beam Energies  Larynx: HN_larynx 3D 63/63 2.25 28/28 6X    1) Diagnoses: C83.38-Diffuse large b-cell lymphoma, lymph nodes of multiple sites Intent: Curative Radiation Treatment Dates: 07/25/2018 through 08/23/2018 Site Technique Total Dose Dose per Fx Completed Fx Beam Energies  Pelvis: Pelvis IMRT 39.6/39.6 1.8 22/22 6X     PAST MEDICAL HISTORY:  has a past medical history of Allergy, Anemia, Arthritis, Blood transfusion without reported diagnosis, Cancer (HCC), Cataract, COPD (chronic obstructive pulmonary disease) (HCC), Dyspnea, Family history of colon cancer, History of chemotherapy, History of kidney stones, History of radiation therapy, Hyperlipidemia, Hypertension, Irregular heart beats, Lymphadenopathy, Pain, lower leg, Peripheral arterial disease (HCC), Pre-diabetes, Red-green color blindness, RLS (restless legs syndrome), Snores, and Wears glasses.  PAST SURGICAL HISTORY: Past Surgical History:  Procedure Laterality Date   CATARACT EXTRACTION W/ INTRAOCULAR LENS  IMPLANT, BILATERAL     COLONOSCOPY     DIRECT LARYNGOSCOPY Right  02/03/2022   Procedure: DIRECT LARYNGOSCOPY WITH BIOPSY OF RIGHT FALSE VOCAL CORD;  Surgeon: Osborn Coho, MD;  Location: [facility] OR;  Service: ENT;  Laterality: Right;   dislodged salava stone     ESOPHAGOSCOPY WITH DILITATION N/A 07/19/2023   Procedure: ESOPHAGOSCOPY, WITH DILATION;  Surgeon: Serena Colonel, MD;  Location: [facility] OR;  Service: ENT;  Laterality: N/A;   FRACTURE SURGERY     HAND ARTHROPLASTY  1995   crushed left hand   INGUINAL LYMPH NODE BIOPSY Left 01/02/2018   Procedure: LEFT INGUINAL LYMPH NODE BIOPSY;  Surgeon: Emelia Loron, MD;  Location: [facility] OR;  Service: General;  Laterality: Left;   IR IMAGING GUIDED PORT INSERTION  01/15/2018   IR IMAGING GUIDED PORT INSERTION  08/10/2022   IR REMOVAL TUN ACCESS W/ PORT W/O FL MOD SED  03/11/2019   IR REPLACE G-TUBE SIMPLE WO FLUORO  06/21/2023   LAPAROSCOPIC INSERTION GASTROSTOMY TUBE N/A 06/19/2023   Procedure: LAPAROSCOPIC INSERTION GASTROSTOMY TUBE;  Surgeon: Quentin Ore, MD;  Location: [facility] ORS;  Service: General;  Laterality: N/A;   LAPAROSCOPIC INSERTION GASTROSTOMY TUBE N/A 06/21/2023   Procedure: LAPAROSCOPIC INSERTION GASTROSTOMY TUBE;  Surgeon: Quentin Ore, MD;  Location: [facility] ORS;  Service: General;  Laterality: N/A;   MEDIASTINOSCOPY N/A 07/22/2022   Procedure: MEDIASTINOSCOPY;  Surgeon: Loreli Slot, MD;  Location: [facility] OR;  Service: Thoracic;  Laterality: N/A;   MICROLARYNGOSCOPY Left 01/17/2014   Procedure: MICROLARYNGOSCOPY WITH EXCISION OF THE BIOPSY OF LEFT VOCAL CORD LESION;  Surgeon: Serena Colonel, MD;  Location: [facility];  Service: ENT;  Laterality: Left;   MICROLARYNGOSCOPY N/A 09/16/2020   Procedure: MICROLARYNGOSCOPY with Biopsy of vocal cord lesion;  Surgeon: Osborn Coho, MD;  Location: [facility] OR;  Service: ENT;  Laterality: N/A;   ORIF FOOT FRACTURE  2005   left   REFRACTIVE SURGERY Right    removed cloudiness in right eye after cataract removal     FAMILY HISTORY: family history  includes Breast cancer in his mother and sister; Colon cancer (age of onset: 53) in his brother; Diabetes in his father; Hypertension in his brother, daughter, and father; Mental illness in his daughter and sister; Stroke in his father.  SOCIAL HISTORY:  reports that he quit smoking about 2 years ago. His smoking use included cigarettes. He started smoking about 38 years ago. He has a 18 pack-year smoking history. He has never used smokeless tobacco. He reports that he does not currently use alcohol after a past usage of about 6.0 standard drinks of alcohol per week. He reports current drug use. Drugs: , Marijuana, and Cocaine.  ALLERGIES: Bee venom, Lisinopril, Antifungal [miconazole nitrate], and Zolpidem tartrate er  MEDICATIONS:  Current Outpatient Medications  Medication Sig Dispense Refill   acetaminophen (TYLENOL) 160 MG/5ML solution Place 20.3 mLs (650 mg total) into feeding tube every 6 (six) hours as needed for mild pain (pain score 1-3). 473 mL 0   albuterol (VENTOLIN HFA) 108 (90 Base) MCG/ACT inhaler Inhale 2 puffs into the lungs every 6 (six) hours as needed for wheezing or shortness of breath. 6.7 g 2   ferrous sulfate 220 (44 Fe) MG/5ML solution Place 6.8 mLs (300 mg total) into feeding tube every other day. 106 mL 0   glycopyrrolate (ROBINUL) 1 MG tablet Place  1 tablet (1 mg total) into feeding tube every other day. Crush tablet, place in water, and then place in feeding tube.     lidocaine-prilocaine (EMLA) cream Apply to affected area once 30 g 3   Nutritional Supplements (NUTREN 1.5) LIQD Give 2 cartons 4 times a day.  Flush with 30ml of water before and after each feeding.  Give additional of water TID for additional hydration.     oxyCODONE (ROXICODONE) 5 MG/5ML solution Place 5 mLs (5 mg total) into feeding tube every 6 (six) hours as needed for breakthrough pain. 75 mL 0   pantoprazole (PROTONIX) 40 MG tablet Take 1 tablet (40 mg total) by mouth daily. 30 tablet 0    Protein (FEEDING SUPPLEMENT, PROSOURCE TF20,) liquid Place 60 mLs into feeding tube daily. 1800 mL 2   Water For Irrigation, Sterile (FREE WATER) SOLN Place 125 mLs into feeding tube every 4 (four) hours. (Patient taking differently: Place 60 mLs into feeding tube every 4 (four) hours. Use 30ml prior to feeding supplement, and 30ml after each feeding supplement)     No current facility-administered medications for this encounter.   Facility-Administered Medications Ordered in Other Encounters  Medication Dose Route Frequency Provider Last Rate Last Admin   sodium chloride flush (NS) 0.9 % injection 10 mL  10 mL Intracatheter PRN Johney Maine, MD   10 mL at 06/13/23 1745    REVIEW OF SYSTEMS:  Notable for that above.   PHYSICAL EXAM:  vitals were not taken for this visit.   General: Alert and oriented, in no acute distress HEENT: Head is normocephalic. Extraocular movements are intact. Oropharynx is notable for ***. Neck: Neck is notable for *** Heart: Regular in rate and rhythm with no murmurs, rubs, or gallops. Chest: Clear to auscultation bilaterally, with no rhonchi, wheezes, or rales. Abdomen: Soft, nontender, nondistended, with no rigidity or guarding. Extremities: No cyanosis or edema. Lymphatics: see Neck Exam Skin: No concerning lesions. Musculoskeletal: symmetric strength and muscle tone throughout. Neurologic: Cranial nerves II through XII are grossly intact. No obvious focalities. Speech is fluent. Coordination is intact. Psychiatric: Judgment and insight are intact. Affect is appropriate.   ECOG = ***  0 - Asymptomatic (Fully active, able to carry on all predisease activities without restriction)  1 - Symptomatic but completely ambulatory (Restricted in physically strenuous activity but ambulatory and able to carry out work of a light or sedentary nature. For example, light housework, office work)  2 - Symptomatic, <50% in bed during the day (Ambulatory and  capable of all self care but unable to carry out any work activities. Up and about more than 50% of waking hours)  3 - Symptomatic, >50% in bed, but not bedbound (Capable of only limited self-care, confined to bed or chair 50% or more of waking hours)  4 - Bedbound (Completely disabled. Cannot carry on any self-care. Totally confined to bed or chair)  5 - Death   Santiago Glad MM, Creech RH, Tormey DC, et al. [phone]). "Toxicity and response criteria of the [facility]". Am. Evlyn Clines. Oncol. 5 (6): 649-55   LABORATORY DATA:  Lab Results  Component Value Date   WBC 12.3 (H) 07/25/2023   HGB 8.4 (L) 07/25/2023   HCT 27.3 (L) 07/25/2023   MCV 79.4 (L) 07/25/2023   PLT 514 (H) 07/25/2023   CMP     Component Value Date/Time   NA 129 (L) 07/25/2023 1155   NA 143 08/21/2019 1459   K  4.8 07/25/2023 1155   CL 86 (L) 07/25/2023 1155   CO2 37 (H) 07/25/2023 1155   GLUCOSE 352 (H) 07/25/2023 1155   BUN 27 (H) 07/25/2023 1155   BUN 17 08/21/2019 1459   CREATININE 0.84 07/25/2023 1155   CREATININE 1.09 04/29/2014 1930   CALCIUM 10.0 07/25/2023 1155   PROT 7.9 07/25/2023 1155   PROT 7.0 08/21/2019 1459   ALBUMIN 3.7 07/25/2023 1155   ALBUMIN 4.7 08/21/2019 1459   AST 9 (L) 07/25/2023 1155   ALT 11 07/25/2023 1155   ALKPHOS 99 07/25/2023 1155   BILITOT 0.4 07/25/2023 1155   GFRNONAA >60 07/25/2023 1155   GFRNONAA 74 04/29/2014 1930   GFRAA >60 11/28/2019 1329   GFRAA 86 04/29/2014 1930      Lab Results  Component Value Date   TSH 16.681 (H) 07/25/2023     RADIOGRAPHY: CT Angio Chest PE W and/or Wo Contrast Result Date: 07/15/2023 CLINICAL DATA:  Multiple suspected pulmonary embolism. EXAM: CT ANGIOGRAPHY CHEST WITH CONTRAST TECHNIQUE: Multidetector CT imaging of the chest was performed using the standard protocol during bolus administration of intravenous contrast. Multiplanar CT image reconstructions and MIPs were obtained to evaluate the vascular anatomy. RADIATION  DOSE REDUCTION: This exam was performed according to the departmental dose-optimization program which includes automated exposure control, adjustment of the mA and/or kV according to patient size and/or use of iterative reconstruction technique. CONTRAST:  75mL OMNIPAQUE IOHEXOL 350 MG/ML SOLN COMPARISON:  July 02, 2023 FINDINGS: Cardiovascular: A right-sided venous Port-A-Cath is seen. Satisfactory opacification of the pulmonary arteries to the segmental level. No evidence of pulmonary embolism. Normal heart size. No pericardial effusion. Mediastinum/Nodes: No enlarged mediastinal, hilar, or axillary lymph nodes. A stable appearing area of soft tissue attenuation is seen at the thoracic inlet. This extends into the superior mediastinum and subsequently surrounds the trachea and esophagus. Tracheal narrowing is again seen at the level of the thoracic inlet. Lungs/Pleura: There is no evidence of acute infiltrate, pleural effusion or pneumothorax. Upper Abdomen: A partially visualized percutaneous gastrostomy tube is noted. There are large bilateral simple renal cysts. Musculoskeletal: No chest wall abnormality. No acute or significant osseous findings. Review of the MIP images confirms the above findings. IMPRESSION: 1. No evidence of pulmonary embolism or other acute intrathoracic process. 2. Stable findings consistent with the patient's known laryngeal cancer. 3. Large bilateral simple renal cysts. No follow-up imaging is recommended. This recommendation follows ACR consensus guidelines: Management of the Incidental Renal Mass on CT: A White Paper of the ACR Incidental Findings Committee. J Am Coll Radiol [phone]. Electronically Signed   By: Aram Candela M.D.   On: 07/15/2023 01:35   DG Chest Portable 1 View Result Date: 07/14/2023 CLINICAL DATA:  Worsening shortness of breath. EXAM: PORTABLE CHEST 1 VIEW COMPARISON:  July 02, 2023 FINDINGS: There is stable right-sided venous Port-A-Cath  positioning. The heart size and mediastinal contours are within normal limits. There is mild right infrahilar atelectasis and/or infiltrate. No pleural effusion or pneumothorax is identified. Multilevel degenerative changes are seen throughout the thoracic spine. IMPRESSION: Mild right infrahilar atelectasis and/or infiltrate. Electronically Signed   By: Aram Candela M.D.   On: 07/14/2023 23:41   ECHOCARDIOGRAM COMPLETE Result Date: 07/03/2023    ECHOCARDIOGRAM REPORT   Patient Name:   Jesus [name] Date of Exam: 07/03/2023 Medical Rec #:  [id number]      Height:       72.0 in Accession #:    [id number]  Weight:       242.5 lb Date of Birth:  1955-11-18     BSA:          2.312 m Patient Age:    13 years       BP:           117/69 mmHg Patient Gender: M              HR:           96 bpm. Exam Location:  [facility] Procedure: 2D Echo, Cardiac Doppler and Color Doppler (Both Spectral and Color            Flow Doppler were utilized during procedure). Indications:    Elevated Troponin  History:        Patient has prior history of Echocardiogram examinations, most                 recent 09/20/2021. COPD, Signs/Symptoms:Shortness of Breath; Risk                 Factors:Hypertension, Diabetes, Current Smoker and Dyslipidemia.  Sonographer:    Lucendia Herrlich RCS Referring Phys: [id number] JUSTIN B HOWERTER IMPRESSIONS  1. Left ventricular ejection fraction, by estimation, is 65 to 70%. The left ventricle has normal function. The left ventricle has no regional wall motion abnormalities. There is mild concentric left ventricular hypertrophy. Left ventricular diastolic parameters are consistent with Grade I diastolic dysfunction (impaired relaxation).  2. Right ventricular systolic function is normal. The right ventricular size is normal.  3. The mitral valve is normal in structure. No evidence of mitral valve regurgitation. No evidence of mitral stenosis.  4. The aortic valve is normal in structure. Aortic valve  regurgitation is not visualized. No aortic stenosis is present.  5. The inferior vena cava is normal in size with greater than 50% respiratory variability, suggesting right atrial pressure of 3 mmHg. FINDINGS  Left Ventricle: Left ventricular ejection fraction, by estimation, is 65 to 70%. The left ventricle has normal function. The left ventricle has no regional wall motion abnormalities. Strain imaging was not performed. The left ventricular internal cavity  size was normal in size. There is mild concentric left ventricular hypertrophy. Left ventricular diastolic parameters are consistent with Grade I diastolic dysfunction (impaired relaxation). Right Ventricle: The right ventricular size is normal. No increase in right ventricular wall thickness. Right ventricular systolic function is normal. Left Atrium: Left atrial size was normal in size. Right Atrium: Right atrial size was normal in size. Pericardium: There is no evidence of pericardial effusion. Mitral Valve: The mitral valve is normal in structure. No evidence of mitral valve regurgitation. No evidence of mitral valve stenosis. Tricuspid Valve: The tricuspid valve is normal in structure. Tricuspid valve regurgitation is trivial. No evidence of tricuspid stenosis. Aortic Valve: The aortic valve is normal in structure. Aortic valve regurgitation is not visualized. No aortic stenosis is present. Aortic valve peak gradient measures 11.0 mmHg. Pulmonic Valve: The pulmonic valve was normal in structure. Pulmonic valve regurgitation is trivial. No evidence of pulmonic stenosis. Aorta: The aortic root is normal in size and structure. Venous: The inferior vena cava is normal in size with greater than 50% respiratory variability, suggesting right atrial pressure of 3 mmHg. IAS/Shunts: No atrial level shunt detected by color flow Doppler. Additional Comments: 3D imaging was not performed.  LEFT VENTRICLE PLAX 2D LVIDd:         5.20 cm   Diastology LVIDs:  3.00  cm   LV e' medial:    6.42 cm/s LV PW:         1.10 cm   LV E/e' medial:  12.2 LV IVS:        1.00 cm   LV e' lateral:   7.62 cm/s LVOT diam:     2.20 cm   LV E/e' lateral: 10.3 LV SV:         67 LV SV Index:   29 LVOT Area:     3.80 cm  RIGHT VENTRICLE             IVC RV S prime:     17.40 cm/s  IVC diam: 1.90 cm TAPSE (M-mode): 2.5 cm LEFT ATRIUM             Index        RIGHT ATRIUM           Index LA diam:        3.50 cm 1.51 cm/m   RA Area:     21.60 cm LA Vol (A2C):   51.7 ml 22.36 ml/m  RA Volume:   61.10 ml  26.42 ml/m LA Vol (A4C):   50.5 ml 21.84 ml/m LA Biplane Vol: 53.3 ml 23.05 ml/m  AORTIC VALVE AV Area (Vmax): 2.61 cm AV Vmax:        166.00 cm/s AV Peak Grad:   11.0 mmHg LVOT Vmax:      114.00 cm/s LVOT Vmean:     74.500 cm/s LVOT VTI:       0.176 m  AORTA Ao Root diam: 3.70 cm Ao Asc diam:  3.60 cm MITRAL VALVE MV Area (PHT): 5.54 cm     SHUNTS MV Decel Time: 137 msec     Systemic VTI:  0.18 m MV E velocity: 78.40 cm/s   Systemic Diam: 2.20 cm MV A velocity: 111.00 cm/s MV E/A ratio:  0.71 Arvilla Meres MD Electronically signed by Arvilla Meres MD Signature Date/Time: 07/03/2023/5:40:02 PM    Final    CT Angio Chest PE W/Cm &/Or Wo Cm Result Date: 07/02/2023 CLINICAL DATA:  Pulmonary embolism (PE) suspected, high prob. Shortness of breath EXAM: CT ANGIOGRAPHY CHEST WITH CONTRAST TECHNIQUE: Multidetector CT imaging of the chest was performed using the standard protocol during bolus administration of intravenous contrast. Multiplanar CT image reconstructions and MIPs were obtained to evaluate the vascular anatomy. RADIATION DOSE REDUCTION: This exam was performed according to the departmental dose-optimization program which includes automated exposure control, adjustment of the mA and/or kV according to patient size and/or use of iterative reconstruction technique. CONTRAST:  75mL OMNIPAQUE IOHEXOL 350 MG/ML SOLN COMPARISON:  06/15/2023 FINDINGS: Cardiovascular: No filling defects in  the pulmonary arteries to suggest pulmonary emboli. Heart is normal size. Aorta is normal caliber. Mediastinum/Nodes: Abnormal soft tissue at the thoracic inlet and extending into the upper mediastinum surrounding the trachea and esophagus corresponding to known laryngeal cancer. This narrows the trachea at the level of the thoracic inlet. Findings are similar to prior study. No adenopathy. Lungs/Pleura: Lungs are clear. No focal airspace opacities or suspicious nodules. No effusions. Upper Abdomen: No acute findings Musculoskeletal: Right upper chest wall Port-A-Cath remains in place. No acute bony abnormality. Review of the MIP images confirms the above findings. IMPRESSION: No evidence of pulmonary embolus. Abnormal soft tissue at the thoracic inlet and upper mediastinum surrounding the trachea and esophagus compatible with patient's known cancer. This narrows the tracheal lumen significantly at the level of the thoracic  inlet. Electronically Signed   By: Charlett Nose M.D.   On: 07/02/2023 21:06   DG Chest Port 1 View Result Date: 07/02/2023 CLINICAL DATA:  Shortness of breath EXAM: PORTABLE CHEST 1 VIEW COMPARISON:  06/15/2023 FINDINGS: Lungs are clear.  No pleural effusion or pneumothorax. The heart is normal in size. Right chest power port terminates in the lower SVC. IMPRESSION: No acute cardiopulmonary disease. Electronically Signed   By: Charline Bills M.D.   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 I *** recommend radiotherapy for this patient.  We discussed the potential risks, benefits, and side effects of radiotherapy. We talked in detail about acute and late effects. We discussed that some of the most bothersome acute effects may be mucositis, dysgeusia, salivary changes, skin irritation, hair loss, dehydration, weight loss and fatigue. We talked about late effects which include but are not necessarily limited to dysphagia,  hypothyroidism, nerve injury, vascular injury, spinal cord injury, xerostomia, trismus, neck edema, dental issues, non-healing wound, and potentially fatal injury to any of the tissues in the head and neck region. No guarantees of treatment were given. A consent form was signed and placed in the patient's medical record. The patient is enthusiastic about proceeding with treatment. I look forward to participating in the patient's care.    Simulation (treatment planning) will take place ***  We also discussed that the treatment of head and neck cancer is a multidisciplinary process to maximize treatment outcomes and quality of life. For this reason the following referrals have been or will be made:  *** Medical oncology to discuss chemotherapy   *** Dentistry for dental evaluation, possible extractions in the radiation fields, and /or advice on reducing risk of cavities, osteoradionecrosis, or other oral issues.  *** Nutritionist for nutrition support during and after treatment.  *** Speech language pathology for swallowing and/or speech therapy.  *** Social work for social support.   *** Physical therapy due to risk of lymphedema in neck and deconditioning.  *** Baseline labs including TSH.  On date of service, in total, I spent *** minutes on this encounter. Patient was seen in person.  __________________________________________   Lonie Peak, MD  This document serves as a record of services personally performed by Lonie Peak, MD. It was created on her behalf by Neena Rhymes, a trained medical scribe. The creation of this record is based on the scribe's personal observations and the provider's statements to them. This document has been checked and approved by the attending provider.

## 2023-07-28 ENCOUNTER — Ambulatory Visit
Admission: RE | Admit: 2023-07-28 | Discharge: 2023-07-28 | Disposition: A | Discharge status: Home / Self Care | Source: Ambulatory Visit | Attending: Radiation Oncology | Admitting: Radiation Oncology

## 2023-07-28 ENCOUNTER — Ambulatory Visit
Admission: RE | Admit: 2023-07-28 | Discharge: 2023-07-28 | Disposition: A | Discharge status: Home / Self Care | Payer: Self-pay | Source: Ambulatory Visit | Attending: Radiation Oncology | Admitting: Radiation Oncology

## 2023-07-28 ENCOUNTER — Telehealth: Payer: Self-pay

## 2023-07-28 VITALS — BP 124/88 | HR 123 | Temp 99.1°F | Resp 24 | Ht 72.0 in

## 2023-07-28 DIAGNOSIS — R634 Abnormal weight loss: Secondary | ICD-10-CM | POA: Insufficient documentation

## 2023-07-28 DIAGNOSIS — C8338 Diffuse large B-cell lymphoma, lymph nodes of multiple sites: Secondary | ICD-10-CM | POA: Insufficient documentation

## 2023-07-28 DIAGNOSIS — Z800 Family history of malignant neoplasm of digestive organs: Secondary | ICD-10-CM | POA: Insufficient documentation

## 2023-07-28 DIAGNOSIS — I371 Nonrheumatic pulmonary valve insufficiency: Secondary | ICD-10-CM | POA: Insufficient documentation

## 2023-07-28 DIAGNOSIS — I119 Hypertensive heart disease without heart failure: Secondary | ICD-10-CM | POA: Insufficient documentation

## 2023-07-28 DIAGNOSIS — Z452 Encounter for adjustment and management of vascular access device: Secondary | ICD-10-CM | POA: Diagnosis not present

## 2023-07-28 DIAGNOSIS — J398 Other specified diseases of upper respiratory tract: Secondary | ICD-10-CM | POA: Insufficient documentation

## 2023-07-28 DIAGNOSIS — J387 Other diseases of larynx: Secondary | ICD-10-CM

## 2023-07-28 DIAGNOSIS — C320 Malignant neoplasm of glottis: Secondary | ICD-10-CM | POA: Insufficient documentation

## 2023-07-28 DIAGNOSIS — J449 Chronic obstructive pulmonary disease, unspecified: Secondary | ICD-10-CM | POA: Insufficient documentation

## 2023-07-28 DIAGNOSIS — M47814 Spondylosis without myelopathy or radiculopathy, thoracic region: Secondary | ICD-10-CM | POA: Insufficient documentation

## 2023-07-28 DIAGNOSIS — J384 Edema of larynx: Secondary | ICD-10-CM | POA: Insufficient documentation

## 2023-07-28 DIAGNOSIS — Z803 Family history of malignant neoplasm of breast: Secondary | ICD-10-CM | POA: Insufficient documentation

## 2023-07-28 DIAGNOSIS — C771 Secondary and unspecified malignant neoplasm of intrathoracic lymph nodes: Secondary | ICD-10-CM | POA: Insufficient documentation

## 2023-07-28 DIAGNOSIS — C4492 Squamous cell carcinoma of skin, unspecified: Secondary | ICD-10-CM | POA: Diagnosis not present

## 2023-07-28 DIAGNOSIS — R1310 Dysphagia, unspecified: Secondary | ICD-10-CM | POA: Insufficient documentation

## 2023-07-28 DIAGNOSIS — N281 Cyst of kidney, acquired: Secondary | ICD-10-CM | POA: Insufficient documentation

## 2023-07-28 DIAGNOSIS — Z510 Encounter for antineoplastic radiation therapy: Secondary | ICD-10-CM | POA: Insufficient documentation

## 2023-07-28 DIAGNOSIS — Z923 Personal history of irradiation: Secondary | ICD-10-CM | POA: Insufficient documentation

## 2023-07-28 DIAGNOSIS — D611 Drug-induced aplastic anemia: Secondary | ICD-10-CM | POA: Diagnosis not present

## 2023-07-28 DIAGNOSIS — Z87891 Personal history of nicotine dependence: Secondary | ICD-10-CM | POA: Insufficient documentation

## 2023-07-28 DIAGNOSIS — Z79899 Other long term (current) drug therapy: Secondary | ICD-10-CM | POA: Insufficient documentation

## 2023-07-28 DIAGNOSIS — R0602 Shortness of breath: Secondary | ICD-10-CM | POA: Diagnosis not present

## 2023-07-28 DIAGNOSIS — D649 Anemia, unspecified: Secondary | ICD-10-CM | POA: Diagnosis not present

## 2023-07-28 MED ORDER — HEPARIN SOD (PORK) LOCK FLUSH 100 UNIT/ML IV SOLN
500.0000 [IU] | Freq: Once | INTRAVENOUS | Status: AC
  Administered 2023-07-28: 500 [IU] via INTRAVENOUS

## 2023-07-28 MED ORDER — SODIUM CHLORIDE 0.9% FLUSH
10.0000 mL | Freq: Once | INTRAVENOUS | Status: AC
  Administered 2023-07-28: 10 mL via INTRAVENOUS

## 2023-07-28 NOTE — Progress Notes (Signed)
 Head and Neck Cancer Location of Tumor / Histology:  Endotracheal Mass- Esophageal and Tracheal Compression from Tumor  Patient Symptoms: Almost a year he has felt a lump in his throat and he feels like it is challenging for him to breath at times. Breathing is labored and has some voice hoarseness. Coughing is productive. Voices he has been having low energy over the past few weeks was nodding off to sleep while talking with patient. Due to difficulty maintaining his nutrition he had a feeding tube placed one month ago and denies any issues with feeding tube. In September patient said he has been unable to eat or drink and has lost over 100 pounds.     06/15/2023 CT Soft Tissue Neck with Contrast      Nutrition Status Yes No Comments  Weight changes? [x]  []  Patient has lost over a 170 pounds. He used to weigh 375 now he is 228lbs.  He has since has a feeding tube placed 4 weeks ago and weight has stabilized.  Swallowing concerns? [x]  []  Patient has trouble swallowing and feels like their is a lump in the back of his throat.  PEG? [x]  []  Patient had feeding tube placed 4 weeks ago   Referrals Yes No Comments  Social Work? [x]  []    Dentistry? [x]  []    Swallowing therapy? [x]  []    Nutrition? [x]  []    Med/Onc? [x]  []     Safety Issues Yes No Comments  Prior radiation? [x]  []    Pacemaker/ICD? []  [x]    Possible current pregnancy? []  [x]    Is the patient on methotrexate? []  [x]     Tobacco/Marijuana/Snuff/ETOH use:  07/19/2023 Pollyann Kennedy, MD Patient quit smoking about 2 years ago. He started smoking about 38 years ago. He has an 18 pack-year smoking history. He reports that he does not currently use alcohol after a past usage of about 6 standard drinks of alcohol per week. He reports current drug use, marijuana and cocaine.  Past/Anticipated interventions by otolaryngology, if any:  07/19/2023 Pollyann Kennedy, MD Direct Laryngoscopy Results: Fixed Left Vocal Cord and Impaired Mobility of the Right  Vocal Cord. Esophagoscopy with Dilation to address dysphagia  Past/Anticipated interventions by medical oncology, if any:   Dr. Candise Che  Continue to receive Immunotherapy with Pembrolizumab Radiation Therapy with Dr. Basilio Cairo   Current Complaints / other details:    None   BP 124/88 (BP Location: Left Arm, Patient Position: Sitting, Cuff Size: Normal)   Pulse (!) 123   Temp 99.1 F (37.3 C)   Resp (!) 24   Ht 6' (1.829 m)   SpO2 100%   BMI 30.94 kg/m      Wt Readings from Last 3 Encounters:  07/25/23 228 lb 1.6 oz (103.5 kg)  07/19/23 228 lb (103.4 kg)  07/17/23 228 lb 9.9 oz (103.7 kg)

## 2023-07-28 NOTE — Addendum Note (Signed)
 Encounter addended by: Rana Snare, LPN on: 07/14/6576 3:08 PM  Actions taken: [facility] administration accepted

## 2023-07-28 NOTE — Telephone Encounter (Signed)
 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 Left my contact information for the pt to call back.

## 2023-07-28 NOTE — Addendum Note (Signed)
 Encounter addended by: Rana Snare, LPN on: 9/56/2130 3:04 PM  Actions taken: Order list changed, Diagnosis association updated, Flowsheet accepted

## 2023-07-29 ENCOUNTER — Other Ambulatory Visit: Payer: Self-pay

## 2023-07-29 ENCOUNTER — Encounter (HOSPITAL_COMMUNITY): Payer: Self-pay | Admitting: Internal Medicine

## 2023-07-29 ENCOUNTER — Inpatient Hospital Stay (HOSPITAL_COMMUNITY)
Admission: EM | Admit: 2023-07-29 | Discharge: 2023-07-31 | DRG: 809 | Disposition: A | Discharge status: Home Health | Attending: Obstetrics and Gynecology | Admitting: Obstetrics and Gynecology

## 2023-07-29 ENCOUNTER — Emergency Department (HOSPITAL_COMMUNITY)

## 2023-07-29 DIAGNOSIS — R041 Hemorrhage from throat: Secondary | ICD-10-CM | POA: Diagnosis not present

## 2023-07-29 DIAGNOSIS — E782 Mixed hyperlipidemia: Secondary | ICD-10-CM | POA: Diagnosis present

## 2023-07-29 DIAGNOSIS — Z803 Family history of malignant neoplasm of breast: Secondary | ICD-10-CM

## 2023-07-29 DIAGNOSIS — Z6830 Body mass index (BMI) 30.0-30.9, adult: Secondary | ICD-10-CM | POA: Diagnosis not present

## 2023-07-29 DIAGNOSIS — I5189 Other ill-defined heart diseases: Secondary | ICD-10-CM

## 2023-07-29 DIAGNOSIS — I10 Essential (primary) hypertension: Secondary | ICD-10-CM | POA: Diagnosis not present

## 2023-07-29 DIAGNOSIS — T45AX5A Adverse effect of immune checkpoint inhibitors and immunostimulant drugs, initial encounter: Secondary | ICD-10-CM | POA: Diagnosis present

## 2023-07-29 DIAGNOSIS — R000 Tachycardia, unspecified: Secondary | ICD-10-CM | POA: Diagnosis not present

## 2023-07-29 DIAGNOSIS — I4510 Unspecified right bundle-branch block: Secondary | ICD-10-CM | POA: Diagnosis present

## 2023-07-29 DIAGNOSIS — I110 Hypertensive heart disease with heart failure: Secondary | ICD-10-CM | POA: Diagnosis present

## 2023-07-29 DIAGNOSIS — C4492 Squamous cell carcinoma of skin, unspecified: Secondary | ICD-10-CM | POA: Diagnosis not present

## 2023-07-29 DIAGNOSIS — E1151 Type 2 diabetes mellitus with diabetic peripheral angiopathy without gangrene: Secondary | ICD-10-CM | POA: Diagnosis present

## 2023-07-29 DIAGNOSIS — Z931 Gastrostomy status: Secondary | ICD-10-CM

## 2023-07-29 DIAGNOSIS — Z923 Personal history of irradiation: Secondary | ICD-10-CM

## 2023-07-29 DIAGNOSIS — C771 Secondary and unspecified malignant neoplasm of intrathoracic lymph nodes: Secondary | ICD-10-CM | POA: Diagnosis not present

## 2023-07-29 DIAGNOSIS — L89152 Pressure ulcer of sacral region, stage 2: Secondary | ICD-10-CM | POA: Diagnosis present

## 2023-07-29 DIAGNOSIS — K922 Gastrointestinal hemorrhage, unspecified: Secondary | ICD-10-CM | POA: Diagnosis not present

## 2023-07-29 DIAGNOSIS — L8990 Pressure ulcer of unspecified site, unspecified stage: Secondary | ICD-10-CM | POA: Diagnosis present

## 2023-07-29 DIAGNOSIS — E869 Volume depletion, unspecified: Secondary | ICD-10-CM | POA: Diagnosis present

## 2023-07-29 DIAGNOSIS — Z818 Family history of other mental and behavioral disorders: Secondary | ICD-10-CM

## 2023-07-29 DIAGNOSIS — C320 Malignant neoplasm of glottis: Secondary | ICD-10-CM | POA: Diagnosis present

## 2023-07-29 DIAGNOSIS — Z87891 Personal history of nicotine dependence: Secondary | ICD-10-CM

## 2023-07-29 DIAGNOSIS — K222 Esophageal obstruction: Secondary | ICD-10-CM | POA: Diagnosis not present

## 2023-07-29 DIAGNOSIS — Z833 Family history of diabetes mellitus: Secondary | ICD-10-CM

## 2023-07-29 DIAGNOSIS — R0602 Shortness of breath: Secondary | ICD-10-CM

## 2023-07-29 DIAGNOSIS — Z9221 Personal history of antineoplastic chemotherapy: Secondary | ICD-10-CM

## 2023-07-29 DIAGNOSIS — Z79899 Other long term (current) drug therapy: Secondary | ICD-10-CM

## 2023-07-29 DIAGNOSIS — Z95828 Presence of other vascular implants and grafts: Secondary | ICD-10-CM

## 2023-07-29 DIAGNOSIS — E66811 Obesity, class 1: Secondary | ICD-10-CM | POA: Diagnosis present

## 2023-07-29 DIAGNOSIS — R069 Unspecified abnormalities of breathing: Secondary | ICD-10-CM | POA: Diagnosis not present

## 2023-07-29 DIAGNOSIS — Z8249 Family history of ischemic heart disease and other diseases of the circulatory system: Secondary | ICD-10-CM

## 2023-07-29 DIAGNOSIS — J441 Chronic obstructive pulmonary disease with (acute) exacerbation: Secondary | ICD-10-CM | POA: Diagnosis not present

## 2023-07-29 DIAGNOSIS — T451X5A Adverse effect of antineoplastic and immunosuppressive drugs, initial encounter: Secondary | ICD-10-CM | POA: Diagnosis not present

## 2023-07-29 DIAGNOSIS — Z452 Encounter for adjustment and management of vascular access device: Secondary | ICD-10-CM | POA: Diagnosis not present

## 2023-07-29 DIAGNOSIS — D6481 Anemia due to antineoplastic chemotherapy: Secondary | ICD-10-CM | POA: Diagnosis not present

## 2023-07-29 DIAGNOSIS — D62 Acute posthemorrhagic anemia: Secondary | ICD-10-CM | POA: Diagnosis present

## 2023-07-29 DIAGNOSIS — H5353 Deuteranomaly: Secondary | ICD-10-CM | POA: Diagnosis not present

## 2023-07-29 DIAGNOSIS — C8338 Diffuse large B-cell lymphoma, lymph nodes of multiple sites: Secondary | ICD-10-CM | POA: Diagnosis not present

## 2023-07-29 DIAGNOSIS — D611 Drug-induced aplastic anemia: Secondary | ICD-10-CM | POA: Diagnosis not present

## 2023-07-29 DIAGNOSIS — D649 Anemia, unspecified: Principal | ICD-10-CM | POA: Diagnosis present

## 2023-07-29 DIAGNOSIS — Z1152 Encounter for screening for COVID-19: Secondary | ICD-10-CM

## 2023-07-29 DIAGNOSIS — R0689 Other abnormalities of breathing: Secondary | ICD-10-CM | POA: Diagnosis not present

## 2023-07-29 DIAGNOSIS — I700 Atherosclerosis of aorta: Secondary | ICD-10-CM | POA: Diagnosis not present

## 2023-07-29 DIAGNOSIS — R7989 Other specified abnormal findings of blood chemistry: Secondary | ICD-10-CM | POA: Diagnosis present

## 2023-07-29 DIAGNOSIS — Z800 Family history of malignant neoplasm of digestive organs: Secondary | ICD-10-CM

## 2023-07-29 DIAGNOSIS — J449 Chronic obstructive pulmonary disease, unspecified: Secondary | ICD-10-CM | POA: Diagnosis not present

## 2023-07-29 DIAGNOSIS — C329 Malignant neoplasm of larynx, unspecified: Secondary | ICD-10-CM | POA: Diagnosis not present

## 2023-07-29 DIAGNOSIS — Z823 Family history of stroke: Secondary | ICD-10-CM

## 2023-07-29 DIAGNOSIS — R0603 Acute respiratory distress: Secondary | ICD-10-CM | POA: Diagnosis not present

## 2023-07-29 DIAGNOSIS — D75839 Thrombocytosis, unspecified: Secondary | ICD-10-CM | POA: Diagnosis present

## 2023-07-29 DIAGNOSIS — I5032 Chronic diastolic (congestive) heart failure: Secondary | ICD-10-CM | POA: Diagnosis not present

## 2023-07-29 DIAGNOSIS — R195 Other fecal abnormalities: Secondary | ICD-10-CM | POA: Diagnosis present

## 2023-07-29 DIAGNOSIS — G2581 Restless legs syndrome: Secondary | ICD-10-CM | POA: Diagnosis present

## 2023-07-29 DIAGNOSIS — D75838 Other thrombocytosis: Secondary | ICD-10-CM | POA: Diagnosis present

## 2023-07-29 DIAGNOSIS — Z87442 Personal history of urinary calculi: Secondary | ICD-10-CM

## 2023-07-29 DIAGNOSIS — E1165 Type 2 diabetes mellitus with hyperglycemia: Secondary | ICD-10-CM | POA: Diagnosis not present

## 2023-07-29 DIAGNOSIS — Z888 Allergy status to other drugs, medicaments and biological substances status: Secondary | ICD-10-CM

## 2023-07-29 DIAGNOSIS — T380X5A Adverse effect of glucocorticoids and synthetic analogues, initial encounter: Secondary | ICD-10-CM | POA: Diagnosis not present

## 2023-07-29 DIAGNOSIS — R710 Precipitous drop in hematocrit: Secondary | ICD-10-CM | POA: Diagnosis not present

## 2023-07-29 DIAGNOSIS — R062 Wheezing: Secondary | ICD-10-CM | POA: Diagnosis not present

## 2023-07-29 LAB — CBC WITH DIFFERENTIAL/PLATELET
Abs Immature Granulocytes: 0.09 10*3/uL — ABNORMAL HIGH (ref 0.00–0.07)
Basophils Absolute: 0.1 10*3/uL (ref 0.0–0.1)
Basophils Relative: 0 %
Eosinophils Absolute: 0 10*3/uL (ref 0.0–0.5)
Eosinophils Relative: 0 %
HCT: 19.5 % — ABNORMAL LOW (ref 39.0–52.0)
Hemoglobin: 5.8 g/dL — CL (ref 13.0–17.0)
Immature Granulocytes: 1 %
Lymphocytes Relative: 13 %
Lymphs Abs: 2 10*3/uL (ref 0.7–4.0)
MCH: 24.1 pg — ABNORMAL LOW (ref 26.0–34.0)
MCHC: 29.7 g/dL — ABNORMAL LOW (ref 30.0–36.0)
MCV: 80.9 fL (ref 80.0–100.0)
Monocytes Absolute: 1.2 10*3/uL — ABNORMAL HIGH (ref 0.1–1.0)
Monocytes Relative: 8 %
Neutro Abs: 12.1 10*3/uL — ABNORMAL HIGH (ref 1.7–7.7)
Neutrophils Relative %: 78 %
Platelets: 706 10*3/uL — ABNORMAL HIGH (ref 150–400)
RBC: 2.41 MIL/uL — ABNORMAL LOW (ref 4.22–5.81)
RDW: 15.9 % — ABNORMAL HIGH (ref 11.5–15.5)
WBC: 15.5 10*3/uL — ABNORMAL HIGH (ref 4.0–10.5)
nRBC: 0 % (ref 0.0–0.2)

## 2023-07-29 LAB — URINALYSIS, W/ REFLEX TO CULTURE (INFECTION SUSPECTED)
Bacteria, UA: NONE SEEN
Bilirubin Urine: NEGATIVE
Glucose, UA: >=500 mg/dL — AB
Hgb urine dipstick: NEGATIVE
Ketones, ur: NEGATIVE mg/dL
Leukocytes,Ua: NEGATIVE
Nitrite: NEGATIVE
Protein, ur: NEGATIVE mg/dL
Specific Gravity, Urine: 1.025 (ref 1.005–1.030)
pH: 5 (ref 5.0–8.0)

## 2023-07-29 LAB — BASIC METABOLIC PANEL
Anion gap: 11 (ref 5–15)
BUN: 28 mg/dL — ABNORMAL HIGH (ref 8–23)
CO2: 34 mmol/L — ABNORMAL HIGH (ref 22–32)
Calcium: 9.4 mg/dL (ref 8.9–10.3)
Chloride: 83 mmol/L — ABNORMAL LOW (ref 98–111)
Creatinine, Ser: 0.91 mg/dL (ref 0.61–1.24)
GFR, Estimated: >60 mL/min (ref >60)
Glucose, Bld: 505 mg/dL (ref 70–99)
Potassium: 4.9 mmol/L (ref 3.5–5.1)
Sodium: 128 mmol/L — ABNORMAL LOW (ref 135–145)

## 2023-07-29 LAB — RESP PANEL BY RT-PCR (RSV, FLU A&B, COVID)  RVPGX2
Influenza A by PCR: NEGATIVE
Influenza B by PCR: NEGATIVE
Resp Syncytial Virus by PCR: NEGATIVE
SARS Coronavirus 2 by RT PCR: NEGATIVE

## 2023-07-29 LAB — HEMOGLOBIN AND HEMATOCRIT, BLOOD
HCT: 28.9 % — ABNORMAL LOW (ref 39.0–52.0)
Hemoglobin: 9 g/dL — ABNORMAL LOW (ref 13.0–17.0)

## 2023-07-29 LAB — GLUCOSE, CAPILLARY
Glucose-Capillary: 327 mg/dL — ABNORMAL HIGH (ref 70–99)
Glucose-Capillary: 136 mg/dL — ABNORMAL HIGH (ref 70–99)
Glucose-Capillary: 199 mg/dL — ABNORMAL HIGH (ref 70–99)

## 2023-07-29 LAB — LACTIC ACID, PLASMA: Lactic Acid, Venous: 1.2 mmol/L (ref 0.5–1.9)

## 2023-07-29 LAB — TROPONIN I (HIGH SENSITIVITY)
Troponin I (High Sensitivity): 18 ng/L — ABNORMAL HIGH (ref <18)
Troponin I (High Sensitivity): 16 ng/L (ref <18)

## 2023-07-29 LAB — PREPARE RBC (CROSSMATCH)

## 2023-07-29 LAB — BRAIN NATRIURETIC PEPTIDE: B Natriuretic Peptide: 80.9 pg/mL (ref 0.0–100.0)

## 2023-07-29 LAB — PROTIME-INR
INR: 1.1 (ref 0.8–1.2)
Prothrombin Time: 14.4 s (ref 11.4–15.2)

## 2023-07-29 LAB — POC OCCULT BLOOD, ED: Fecal Occult Bld: POSITIVE — AB

## 2023-07-29 MED ORDER — ORAL CARE MOUTH RINSE: 15.0000 mL | OROMUCOSAL | Status: DC | PRN

## 2023-07-29 MED ORDER — PANTOPRAZOLE SODIUM 40 MG IV SOLR
40.0000 mg | Freq: Two times a day (BID) | INTRAVENOUS | Status: DC
  Administered 2023-07-29 – 2023-07-31 (×4): 40 mg via INTRAVENOUS
  Filled 2023-07-29 (×4): qty 10

## 2023-07-29 MED ORDER — ACETAMINOPHEN 325 MG PO TABS
650.0000 mg | ORAL_TABLET | Freq: Four times a day (QID) | ORAL | Status: DC | PRN
  Filled 2023-07-29: qty 2

## 2023-07-29 MED ORDER — SODIUM CHLORIDE 0.9% IV SOLUTION: Freq: Once | INTRAVENOUS | Status: AC

## 2023-07-29 MED ORDER — IPRATROPIUM-ALBUTEROL 0.5-2.5 (3) MG/3ML IN SOLN: 3.0000 mL | RESPIRATORY_TRACT | Status: DC | PRN

## 2023-07-29 MED ORDER — FREE WATER
200.0000 mL | Freq: Three times a day (TID) | Status: DC
  Administered 2023-07-29 – 2023-07-31 (×5): 200 mL

## 2023-07-29 MED ORDER — GLUCERNA 1.2 CAL PO LIQD
474.0000 mL | Freq: Two times a day (BID) | ORAL | Status: DC
  Administered 2023-07-29: 375 mL
  Administered 2023-07-30: 474 mL
  Filled 2023-07-29 (×3): qty 474

## 2023-07-29 MED ORDER — INSULIN ASPART 100 UNIT/ML IJ SOLN
20.0000 [IU] | Freq: Once | INTRAMUSCULAR | Status: AC
  Administered 2023-07-29: 20 [IU] via SUBCUTANEOUS
  Filled 2023-07-29: qty 0.2

## 2023-07-29 MED ORDER — GLUCERNA 1.2 CAL PO LIQD: 474.0000 mL | Freq: Three times a day (TID) | ORAL | Status: DC

## 2023-07-29 MED ORDER — SODIUM CHLORIDE 0.9% FLUSH
10.0000 mL | INTRAVENOUS | Status: DC | PRN
  Administered 2023-07-29 (×2): 10 mL

## 2023-07-29 MED ORDER — GLUCERNA 1.2 CAL PO LIQD: 474.0000 mL | Freq: Two times a day (BID) | ORAL | Status: DC

## 2023-07-29 MED ORDER — GLYCOPYRROLATE 1 MG PO TABS
1.0000 mg | ORAL_TABLET | ORAL | Status: DC
  Administered 2023-07-29: 1 mg
  Filled 2023-07-29: qty 1

## 2023-07-29 MED ORDER — PANTOPRAZOLE SODIUM 40 MG IV SOLR: 40.0000 mg | INTRAVENOUS | Status: AC

## 2023-07-29 MED ORDER — ONDANSETRON HCL 4 MG PO TABS: 4.0000 mg | ORAL_TABLET | Freq: Four times a day (QID) | ORAL | Status: DC | PRN

## 2023-07-29 MED ORDER — SODIUM CHLORIDE 0.45 % IV BOLUS
1000.0000 mL | Freq: Once | INTRAVENOUS | Status: AC
  Administered 2023-07-29: 1000 mL via INTRAVENOUS

## 2023-07-29 MED ORDER — PROCHLORPERAZINE EDISYLATE 10 MG/2ML IJ SOLN: 10.0000 mg | Freq: Four times a day (QID) | INTRAMUSCULAR | Status: DC | PRN

## 2023-07-29 MED ORDER — METHYLPREDNISOLONE SODIUM SUCC 40 MG IJ SOLR
40.0000 mg | Freq: Every day | INTRAMUSCULAR | Status: DC
Start: 2023-07-30 — End: 2023-07-31
  Administered 2023-07-30 – 2023-07-31 (×2): 40 mg via INTRAVENOUS
  Filled 2023-07-29 (×2): qty 1

## 2023-07-29 MED ORDER — ACETAMINOPHEN 650 MG RE SUPP: 650.0000 mg | Freq: Four times a day (QID) | RECTAL | Status: DC | PRN

## 2023-07-29 MED ORDER — PREDNISONE 20 MG PO TABS: 40.0000 mg | ORAL_TABLET | Freq: Every day | ORAL | Status: DC

## 2023-07-29 MED ORDER — ONDANSETRON HCL 4 MG/2ML IJ SOLN: 4.0000 mg | Freq: Four times a day (QID) | INTRAMUSCULAR | Status: DC | PRN

## 2023-07-29 MED ORDER — OXYCODONE HCL 5 MG/5ML PO SOLN
5.0000 mg | Freq: Four times a day (QID) | ORAL | Status: DC | PRN
  Administered 2023-07-30 – 2023-07-31 (×2): 5 mg
  Filled 2023-07-29 (×3): qty 5

## 2023-07-29 MED ORDER — INSULIN ASPART 100 UNIT/ML IJ SOLN
0.0000 [IU] | Freq: Three times a day (TID) | INTRAMUSCULAR | Status: DC
  Administered 2023-07-29: 15 [IU] via SUBCUTANEOUS
  Administered 2023-07-29: 3 [IU] via SUBCUTANEOUS
  Administered 2023-07-30: 4 [IU] via SUBCUTANEOUS
  Administered 2023-07-30 (×2): 7 [IU] via SUBCUTANEOUS
  Administered 2023-07-31: 20 [IU] via SUBCUTANEOUS
  Administered 2023-07-31: 4 [IU] via SUBCUTANEOUS
  Filled 2023-07-29: qty 0.2

## 2023-07-29 MED ORDER — CHLORHEXIDINE GLUCONATE CLOTH 2 % EX PADS
6.0000 | MEDICATED_PAD | Freq: Every day | CUTANEOUS | Status: DC
  Administered 2023-07-29 – 2023-07-31 (×3): 6 via TOPICAL

## 2023-07-29 MED ORDER — PANTOPRAZOLE SODIUM 40 MG IV SOLR: 40.0000 mg | Freq: Once | INTRAVENOUS | Status: DC

## 2023-07-29 MED ORDER — INSULIN ASPART PROT & ASPART (70-30 MIX) 100 UNIT/ML ~~LOC~~ SUSP: 20.0000 [IU] | Freq: Once | SUBCUTANEOUS | Status: DC

## 2023-07-29 MED ORDER — SODIUM CHLORIDE 0.9% FLUSH
10.0000 mL | Freq: Two times a day (BID) | INTRAVENOUS | Status: DC
  Administered 2023-07-29 – 2023-07-30 (×3): 10 mL
  Administered 2023-07-30: 20 mL

## 2023-07-29 NOTE — H&P (Signed)
 History and Physical    Patient: Jesus [name] [id number] DOB: [birth year] DOA: 07/29/2023 DOS: the patient was seen and examined on 07/29/2023 PCP: Noberto Retort, MD  Patient coming from: Home  Chief Complaint:  Chief Complaint  Patient presents with   Respiratory Distress   HPI: Jesus [name] is a 68 y.o. male with medical history significant of seasonal allergies, cocaine abuse, chemotherapy-induced anemia, non-Hodgkin's lymphoma, history of radiation therapy, cataracts, COPD, nephrolithiasis, hyperlipidemia, hypertension, irregular heartbeat, peripheral arterial disease, prediabetes, red-green color blindness, restless leg syndrome, class I obesity, history of laryngeal cancer, status post G-tube placement who was admitted from 06/15/2023 until 06/24/2023 due to dysphagia secondary to laryngeal squamous cell carcinoma, then readmitted due to dyspnea secondary to tracheal narrowing from 07/14/2023 until 07/17/2023 who returned today to the emergency department via EMS due to respiratory distress.  He received 2 DuoNebs, albuterol 5 mg neb, Solu-Medrol 125 mg IVP and magnesium sulfate 2 g IVPB. He denied fever, chills, rhinorrhea or hemoptysis.  No chest pain, palpitations, diaphoresis, PND, orthopnea or pitting edema of the lower extremities.  No abdominal pain, nausea, emesis, diarrhea, constipation, melena or hematochezia.  No flank pain, dysuria, frequency or hematuria.  No polyuria, polydipsia, polyphagia or blurred vision.   Lab work: Fecal occult blood was positive.  Urinalysis showed greater than 500 glucose, but was otherwise unremarkable.  CBC showed a white count of 15.5, hemoglobin 5.8 g/dL platelets 578.  PT 14.1 and INR 1.1.  Coronavirus, influenza, RSV PCR test lactic acid, troponin x 2 and BNP negative.  BMP showed a CO2 of 34 mmol/L with an anion gap of 11, the rest of the electrolytes were normal after sodium/chloride correction.  Glucose 505, BUN 28 and creatinine 0.91  mg/dL.  Imaging: Portable 1 view chest radiograph with no evidence of acute chest disease.  Right IJ port catheter in place.   ED course: Initial vital signs were temperature 98.5 F, pulse 116, respiration 19, BP 157/89 mmHg O2 sat 100% on nonrebreather mask.  The patient's oxygen requirement has been taper off and he was on nasal cannula at 3 LPM when I saw him.  The ED ordered 2 units of PRBC.  I added 1000 mL of half NS and 20 units of RI SQ.  Review of Systems: As mentioned in the history of present illness. All other systems reviewed and are negative. 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- last radiation 06-2018   Cataract    removed both eyes with l;ens implants    COPD (chronic obstructive pulmonary disease) (HCC)    Dyspnea    Family history of colon cancer    in his brother- dx'd age 51    History of chemotherapy    last 06-01-2018   History of kidney stones    History of radiation therapy    last radiation 06-2018   Hyperlipidemia    currently under control   Hypertension    Irregular heart beats    Lymphadenopathy    Pain, lower leg    Bilateral   Peripheral arterial disease (HCC)    Pre-diabetes    Red-green color blindness    RLS (restless legs syndrome)    Snores    Wears  glasses    Past Surgical History:  Procedure Laterality Date   CATARACT EXTRACTION W/ INTRAOCULAR LENS  IMPLANT, BILATERAL     COLONOSCOPY     DIRECT LARYNGOSCOPY Right 02/03/2022   Procedure: DIRECT LARYNGOSCOPY WITH BIOPSY OF RIGHT FALSE VOCAL CORD;  Surgeon: Osborn Coho, MD;  Location: [facility] OR;  Service: ENT;  Laterality: Right;   dislodged salava stone     ESOPHAGOSCOPY WITH DILITATION N/A 07/19/2023   Procedure: ESOPHAGOSCOPY, WITH DILATION;  Surgeon: Serena Colonel, MD;  Location: [facility] OR;   Service: ENT;  Laterality: N/A;   FRACTURE SURGERY     HAND ARTHROPLASTY  1995   crushed left hand   INGUINAL LYMPH NODE BIOPSY Left 01/02/2018   Procedure: LEFT INGUINAL LYMPH NODE BIOPSY;  Surgeon: Emelia Loron, MD;  Location: [facility] OR;  Service: General;  Laterality: Left;   IR IMAGING GUIDED PORT INSERTION  01/15/2018   IR IMAGING GUIDED PORT INSERTION  08/10/2022   IR REMOVAL TUN ACCESS W/ PORT W/O FL MOD SED  03/11/2019   IR REPLACE G-TUBE SIMPLE WO FLUORO  06/21/2023   LAPAROSCOPIC INSERTION GASTROSTOMY TUBE N/A 06/19/2023   Procedure: LAPAROSCOPIC INSERTION GASTROSTOMY TUBE;  Surgeon: Quentin Ore, MD;  Location: [facility] ORS;  Service: General;  Laterality: N/A;   LAPAROSCOPIC INSERTION GASTROSTOMY TUBE N/A 06/21/2023   Procedure: LAPAROSCOPIC INSERTION GASTROSTOMY TUBE;  Surgeon: Quentin Ore, MD;  Location: [facility] ORS;  Service: General;  Laterality: N/A;   MEDIASTINOSCOPY N/A 07/22/2022   Procedure: MEDIASTINOSCOPY;  Surgeon: Loreli Slot, MD;  Location: [facility] OR;  Service: Thoracic;  Laterality: N/A;   MICROLARYNGOSCOPY Left 01/17/2014   Procedure: MICROLARYNGOSCOPY WITH EXCISION OF THE BIOPSY OF LEFT VOCAL CORD LESION;  Surgeon: Serena Colonel, MD;  Location: [facility];  Service: ENT;  Laterality: Left;   MICROLARYNGOSCOPY N/A 09/16/2020   Procedure: MICROLARYNGOSCOPY with Biopsy of vocal cord lesion;  Surgeon: Osborn Coho, MD;  Location: [facility] OR;  Service: ENT;  Laterality: N/A;   ORIF FOOT FRACTURE  2005   left   REFRACTIVE SURGERY Right    removed cloudiness in right eye after cataract removal    Social History:  reports that he quit smoking about 2 years ago. His smoking use included cigarettes. He started smoking about 38 years ago. He has a 18 pack-year smoking history. He has never used smokeless tobacco. He reports that he does not currently use alcohol after a past usage of about 6.0 standard drinks of alcohol per week. He reports current drug use.  Drugs: , Marijuana, and Cocaine.  Allergies  Allergen Reactions   Bee Venom Anaphylaxis   Lisinopril Other (See Comments)    Swelling in face   Antifungal [Miconazole Nitrate] Other (See Comments)     made his head hurt   Zolpidem Tartrate Er Other (See Comments)    Leg cramps    Family History  Problem Relation Age of Onset   Breast cancer Mother    Diabetes Father    Hypertension Father    Stroke Father    Mental illness Sister    Hypertension Daughter    Mental illness Daughter    Hypertension Brother    Colon cancer Brother 89       passed away 12-08-2018   Breast cancer Sister    Esophageal cancer Neg Hx    Colon polyps Neg Hx    Rectal cancer Neg Hx    Stomach cancer Neg Hx     Prior to Admission medications  Medication Sig Start Date End Date Taking? Authorizing Provider  acetaminophen (TYLENOL) 160 MG/5ML solution Place 20.3 mLs (650 mg total) into feeding tube every 6 (six) hours as needed for mild pain (pain score 1-3). 06/20/23   Maczis, Elmer Sow, PA-C  albuterol (VENTOLIN HFA) 108 (90 Base) MCG/ACT inhaler Inhale 2 puffs into the lungs every 6 (six) hours as needed for wheezing or shortness of breath. 07/05/23   Marcelino Duster, MD  ferrous sulfate 220 (44 Fe) MG/5ML solution Place 6.8 mLs (300 mg total) into feeding tube every other day. 07/17/23   Danford, Earl Lites, MD  glycopyrrolate (ROBINUL) 1 MG tablet Place 1 tablet (1 mg total) into feeding tube every other day. Crush tablet, place in water, and then place in feeding tube. 07/17/23   Danford, Earl Lites, MD  lidocaine-prilocaine (EMLA) cream Apply to affected area once 08/04/22   Johney Maine, MD  Nutritional Supplements (NUTREN 1.5) LIQD Give 2 cartons 4 times a day.  Flush with 30ml of water before and after each feeding.  Give additional of water TID for additional hydration. 06/26/23   Johney Maine, MD  oxyCODONE (ROXICODONE) 5 MG/5ML solution Place 5 mLs (5 mg total) into  feeding tube every 6 (six) hours as needed for breakthrough pain. 06/20/23   Maczis, Elmer Sow, PA-C  pantoprazole (PROTONIX) 40 MG tablet Take 1 tablet (40 mg total) by mouth daily. 07/05/23   Marcelino Duster, MD  Protein (FEEDING SUPPLEMENT, PROSOURCE TF20,) liquid Place 60 mLs into feeding tube daily. 07/06/23   Marcelino Duster, MD  Water For Irrigation, Sterile (FREE WATER) SOLN Place 125 mLs into feeding tube every 4 (four) hours. Patient taking differently: Place 60 mLs into feeding tube every 4 (four) hours. Use 30ml prior to feeding supplement, and 30ml after each feeding supplement 06/24/23 09/22/23  [place], Eric J, DO    Physical Exam: Vitals:   07/29/23 0622  BP: (!) 157/89  Pulse: (!) 116  Resp: 19  Temp: 98.5 F (36.9 C)  TempSrc: Oral  SpO2: 100%   Physical Exam Vitals and nursing note reviewed.  Constitutional:      General: He is awake. He is not in acute distress.    Appearance: He is ill-appearing.  HENT:     Head: Normocephalic.     Mouth/Throat:     Mouth: Mucous membranes are moist.  Eyes:     General: No scleral icterus.    Pupils: Pupils are equal, round, and reactive to light.  Neck:     Vascular: No JVD.  Cardiovascular:     Rate and Rhythm: Normal rate and regular rhythm.     Heart sounds: S1 normal and S2 normal.  Pulmonary:     Effort: Pulmonary effort is normal.     Breath sounds: Normal breath sounds. No wheezing, rhonchi or rales.  Abdominal:     General: Bowel sounds are normal.     Palpations: Abdomen is soft.     Tenderness: There is no abdominal tenderness. There is no right CVA tenderness, left CVA tenderness or guarding.  Musculoskeletal:     Cervical back: Neck supple.     Right lower leg: No edema.     Left lower leg: No edema.  Skin:    General: Skin is warm and dry.  Neurological:     General: No focal deficit present.     Mental Status: He is alert and oriented to person, place, and time.  Psychiatric:  Mood  and Affect: Mood normal.        Behavior: Behavior normal. Behavior is cooperative.     Data Reviewed:  Results are pending, will review when available. 07/03/2023 echocardiogram report. IMPRESSIONS:   1. Left ventricular ejection fraction, by estimation, is 65 to 70%. The  left ventricle has normal function. The left ventricle has no regional  wall motion abnormalities. There is mild concentric left ventricular  hypertrophy. Left ventricular diastolic  parameters are consistent with Grade I diastolic dysfunction (impaired  relaxation).   2. Right ventricular systolic function is normal. The right ventricular  size is normal.   3. The mitral valve is normal in structure. No evidence of mitral valve  regurgitation. No evidence of mitral stenosis.   4. The aortic valve is normal in structure. Aortic valve regurgitation is  not visualized. No aortic stenosis is present.   5. The inferior vena cava is normal in size with greater than 50%  respiratory variability, suggesting right atrial pressure of 3 mmHg.   EKG:  Vent. rate 114 BPM PR interval 93 ms QRS duration 155 ms QT/QTcB 366/504 ms P-R-T axes 0 -87 94 Sinus tachycardia Probable left atrial enlargement Right bundle branch block LVH with IVCD and secondary repol abnrm Prolonged QT interval The ED order  Assessment and Plan: Principal Problem:   GI bleed Associated with:   Acute on chronic blood loss anemia Admit to stepdown/inpatient. Keep NPO for now. -Advance to PEG feeding. Continue pantoprazole every 8 hours.. Monitor H&H. Transfuse further as needed. GI consult greatly appreciated.  Active Problems:   Thrombocytosis In the setting of anemia and malignancy. Transfuse PRBC. Monitor platelet count.    Grade I diastolic dysfunction  Looks volume depleted. May benefit from low-dose ACE/beta-blocker. Follow-up with primary care provider and/or cardiology as an outpatient.     HTN (hypertension) Not on  antihypertensives. Monitor blood pressure closely. Oral or parenteral antihypertensives as needed.     Mixed hyperlipidemia   Aortic atherosclerosis (HCC)  Currently not on medical therapy. Follow-up with primary care provider.     Type 2 diabetes mellitus with hyperglycemia (HCC) And associated:   Pseudohyponatremia Will resume PEG feeding. CBG monitoring with RI SS.    Pressure injury of skin Continue local care and preventive measures.     Advance Care Planning:   Code Status: Full Code   Consults:   Family Communication:   Severity of Illness: The appropriate patient status for this patient is OBSERVATION. Observation status is judged to be reasonable and necessary in order to provide the required intensity of service to ensure the patient's safety. The patient's presenting symptoms, physical exam findings, and initial radiographic and laboratory data in the context of their medical condition is felt to place them at decreased risk for further clinical deterioration. Furthermore, it is anticipated that the patient will be medically stable for discharge from the hospital within 2 midnights of admission.   Author: Bobette Mo, MD 07/29/2023 8:22 AM  For on call review [email or web address].   This document was prepared using Dragon voice recognition software and may contain some unintended transcription errors.

## 2023-07-29 NOTE — ED Provider Notes (Addendum)
 [place] [facility] AT [facility] Provider Note   CSN: [id number] Arrival date & time: 07/29/23  1324     History  Chief Complaint  Patient presents with   Respiratory Distress   HPI Jesus [name] is a 68 y.o. male with squamous cell carcinoma of the larynx, COPD, hypertension presenting for respiratory distress.  States he is "always short of breath".  Has been more short of breath in the last 12 hours and much worse this morning.  Shortness of breath is constant and not necessarily worse with exertion.  He denies cough and fever.  Denies chest pain.  From EMS he received 2 DuoNebs, albuterol x 5, 125 mg of Solu-Medrol and 2 g of mag.  Denies any lower extremity edema.  Denies bloody output.  HPI     Home Medications Prior to Admission medications   Medication Sig Start Date End Date Taking? Authorizing Provider  acetaminophen (TYLENOL) 160 MG/5ML solution Place 20.3 mLs (650 mg total) into feeding tube every 6 (six) hours as needed for mild pain (pain score 1-3). 06/20/23   Maczis, Elmer Sow, PA-C  albuterol (VENTOLIN HFA) 108 (90 Base) MCG/ACT inhaler Inhale 2 puffs into the lungs every 6 (six) hours as needed for wheezing or shortness of breath. 07/05/23   Marcelino Duster, MD  ferrous sulfate 220 (44 Fe) MG/5ML solution Place 6.8 mLs (300 mg total) into feeding tube every other day. 07/17/23   Danford, Earl Lites, MD  glycopyrrolate (ROBINUL) 1 MG tablet Place 1 tablet (1 mg total) into feeding tube every other day. Crush tablet, place in water, and then place in feeding tube. 07/17/23   Danford, Earl Lites, MD  lidocaine-prilocaine (EMLA) cream Apply to affected area once 08/04/22   Johney Maine, MD  Nutritional Supplements (NUTREN 1.5) LIQD Give 2 cartons 4 times a day.  Flush with 30ml of water before and after each feeding.  Give additional of water TID for additional hydration. 06/26/23   Johney Maine, MD  oxyCODONE  (ROXICODONE) 5 MG/5ML solution Place 5 mLs (5 mg total) into feeding tube every 6 (six) hours as needed for breakthrough pain. 06/20/23   Maczis, Elmer Sow, PA-C  pantoprazole (PROTONIX) 40 MG tablet Take 1 tablet (40 mg total) by mouth daily. 07/05/23   Marcelino Duster, MD  Protein (FEEDING SUPPLEMENT, PROSOURCE TF20,) liquid Place 60 mLs into feeding tube daily. 07/06/23   Marcelino Duster, MD  Water For Irrigation, Sterile (FREE WATER) SOLN Place 125 mLs into feeding tube every 4 (four) hours. Patient taking differently: Place 60 mLs into feeding tube every 4 (four) hours. Use 30ml prior to feeding supplement, and 30ml after each feeding supplement 06/24/23 09/22/23  [place], Alvira Philips, DO      Allergies    Bee venom, Lisinopril, Antifungal [miconazole nitrate], and Zolpidem tartrate er    Review of Systems   See HPI  Physical Exam Updated Vital Signs BP (!) 157/89 (BP Location: Left Arm)   Pulse (!) 116   Temp 98.5 F (36.9 C) (Oral)   Resp 19   SpO2 100%  Physical Exam Vitals and nursing note reviewed.  HENT:     Head: Normocephalic and atraumatic.     Mouth/Throat:     Mouth: Mucous membranes are moist.  Eyes:     General:        Right eye: No discharge.        Left eye: No discharge.     Conjunctiva/sclera: Conjunctivae  normal.  Cardiovascular:     Rate and Rhythm: Normal rate and regular rhythm.     Pulses: Normal pulses.     Heart sounds: Normal heart sounds.  Pulmonary:     Effort: Pulmonary effort is normal.     Breath sounds: Examination of the right-lower field reveals decreased breath sounds. Examination of the left-lower field reveals decreased breath sounds. Decreased breath sounds present. No wheezing, rhonchi or rales.  Abdominal:     General: Abdomen is flat.     Palpations: Abdomen is soft.  Skin:    General: Skin is warm and dry.  Neurological:     General: No focal deficit present.  Psychiatric:        Mood and Affect: Mood normal.     ED  Results / Procedures / Treatments   Labs (all labs ordered are listed, but only abnormal results are displayed) Labs Reviewed  CBC WITH DIFFERENTIAL/PLATELET - Abnormal; Notable for the following components:      Result Value   WBC 15.5 (*)    RBC 2.41 (*)    Hemoglobin 5.8 (*)    HCT 19.5 (*)    MCH 24.1 (*)    MCHC 29.7 (*)    RDW 15.9 (*)    Platelets 706 (*)    Neutro Abs 12.1 (*)    Monocytes Absolute 1.2 (*)    Abs Immature Granulocytes 0.09 (*)    All other components within normal limits  BASIC METABOLIC PANEL - Abnormal; Notable for the following components:   Sodium 128 (*)    Chloride 83 (*)    CO2 34 (*)    Glucose, Bld 505 (*)    BUN 28 (*)    All other components within normal limits  POC OCCULT BLOOD, ED - Abnormal; Notable for the following components:   Fecal Occult Bld POSITIVE (*)    All other components within normal limits  TROPONIN I (HIGH SENSITIVITY) - Abnormal; Notable for the following components:   Troponin I (High Sensitivity) 18 (*)    All other components within normal limits  RESP PANEL BY RT-PCR (RSV, FLU A&B, COVID)  RVPGX2  BRAIN NATRIURETIC PEPTIDE  PROTIME-INR  LACTIC ACID, PLASMA  LACTIC ACID, PLASMA  URINALYSIS, W/ REFLEX TO CULTURE (INFECTION SUSPECTED)  TYPE AND SCREEN  PREPARE RBC (CROSSMATCH)  TROPONIN I (HIGH SENSITIVITY)    EKG EKG Interpretation Date/Time:  Saturday July 29 2023 06:22:28 EDT Ventricular Rate:  114 PR Interval:  93 QRS Duration:  155 QT Interval:  366 QTC Calculation: 504 R Axis:   -87  Text Interpretation: Sinus tachycardia Probable left atrial enlargement Right bundle branch block LVH with IVCD and secondary repol abnrm Prolonged QT interval Confirmed by Palumbo, April ([id number]) on 07/29/2023 6:26:17 AM  Radiology DG Chest Portable 1 View Result Date: 07/29/2023 CLINICAL DATA:  Expiratory wheezing and shortness of breath.  Fall EXAM: PORTABLE CHEST 1 VIEW COMPARISON:  None Available. FINDINGS: The  heart size and mediastinal contours are within normal limits. Both lungs are clear. The visualized skeletal structures are intact, with thoracic spondylosis. There is a right IJ port catheter with tip in the mid SVC. Unchanged. Compare: Overall aeration seems unchanged. IMPRESSION: No evidence of acute chest disease. Right IJ port catheter positioning is stable or. Electronically Signed   By: Almira Bar M.D.   On: 07/29/2023 07:25    Procedures .Critical Care  Performed by: Gareth Eagle, PA-C Authorized by: Gareth Eagle, PA-C   Critical  care provider statement:    Critical care time (minutes):  30   Critical care was necessary to treat or prevent imminent or life-threatening deterioration of the following conditions: critical anemia with associated symptoms requiring blood transfusion.   Critical care was time spent personally by me on the following activities:  Development of treatment plan with patient or surrogate, discussions with consultants, evaluation of patient's response to treatment, examination of patient, ordering and review of laboratory studies, ordering and review of radiographic studies, ordering and performing treatments and interventions, pulse oximetry, re-evaluation of patient's condition and review of old charts     Medications Ordered in ED Medications  0.9 %  sodium chloride infusion (Manually program via Guardrails IV Fluids) (has no administration in time range)  acetaminophen (TYLENOL) tablet 650 mg (has no administration in time range)    Or  acetaminophen (TYLENOL) suppository 650 mg (has no administration in time range)  ondansetron (ZOFRAN) tablet 4 mg (has no administration in time range)    Or  ondansetron (ZOFRAN) injection 4 mg (has no administration in time range)  insulin aspart (novoLOG) injection 20 Units (has no administration in time range)  insulin aspart (novoLOG) injection 0-20 Units (has no administration in time range)   methylPREDNISolone sodium succinate (SOLU-MEDROL) 40 mg/mL injection 40 mg (has no administration in time range)    Followed by  predniSONE (DELTASONE) tablet 40 mg (has no administration in time range)  pantoprazole (PROTONIX) injection 40 mg (has no administration in time range)    Followed by  pantoprazole (PROTONIX) injection 40 mg (has no administration in time range)    ED Course/ Medical Decision Making/ A&P                                 Medical Decision Making Amount and/or Complexity of Data Reviewed Labs: ordered. Radiology: ordered.  Risk Prescription drug management. Decision regarding hospitalization.   Initial Impression and Ddx 68 year old old well-appearing male in respiratory distress presenting for shortness of breath.  Exam notable for tachypnea, tachycardia on room air, and diminished basilar breath sounds.  Initially placed on 3 L nasal cannula.  DDx includes pneumonia, PE, airway obstruction, CHF exacerbation, COPD ex, ACS, pneumothorax, other. Patient PMH that increases complexity of ED encounter:   squamous cell carcinoma of the larynx, COPD, hypertension  Interpretation of Diagnostics - I independent reviewed and interpreted the labs as followed: anemia (5.8), hyperglycemia (505), leukocytosis, hemoccult positive  - I independently visualized the following imaging with scope of interpretation limited to determining acute life threatening conditions related to emergency care: CXR, which revealed no acute findings  -I personally reviewed and interpreted EKG which revealed sinus tachycardia  Patient Reassessment and Ultimate Disposition/Management On reassessment, tachycardia still persistent but work of breathing and respiratory rate had improved after placement of oxygen.  Suspect his symptoms and his tachycardia could be related to the fact that he is critically anemic. Ordered 2 units of blood.  Considered PE but feel that it is unlikely at this time  given that he is not having chest pain and he has had multiple PE studies in the last couple months which have been negative.  However would have a low threshold to scan if his symptoms or clinical status changes.  At this time does not appear septic, but lactic acid is pending.   Also found to be Hemoccult positive.  Administered Protonix bolus and awaiting GI recs.  For his hyperglycemia without DKA give him 20 units of NovoLog per Dr. Donato Heinz request.  Admitted to hospital service with Dr. Robb Matar.   Patient management required discussion with the following services or consulting groups:  Hospitalist Service and Gastroenterology  Complexity of Problems Addressed Acute complicated illness or Injury  Additional Data Reviewed and Analyzed Further history obtained from: Past medical history and medications listed in the EMR and Prior ED visit notes  Patient Encounter Risk Assessment Consideration of hospitalization    Final Clinical Impression(s) / ED Diagnoses Final diagnoses:  Anemia, unspecified type  Shortness of breath    Rx / DC Orders ED Discharge Orders     None         Gareth Eagle, PA-C 07/29/23 0833    Gareth Eagle, PA-C 07/29/23 5621    Nicanor Alcon, April, MD 07/29/23 2320

## 2023-07-29 NOTE — Consult Note (Addendum)
 Consultation  Referring Provider:     [facility] Primary Care Physician:  Jesus Retort, MD Primary Gastroenterologist:        Jesus [name] Reason for Consultation:     anemia ? GI bleed     Impression / Plan:   Severe acute on chronic anemia with brown heme positive stool cause of bleeding is not clear. 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  Esophageal dilation to 47 French by Dr. Pollyann [name] 07/19/2023  Mild pharyngeal bleeding  ----------------------------------------------------------------------------------------------------------------------------------   I do not have enough evidence to confidently believe this is major GI bleeding and in fact only a heme positive stool and brown color of the stool goes against that.   Question if he has bleeding from the tumor.  He is now having some pharyngeal bleeding as stated above.  Other possibility would be impact of Jesus [name] therapy which can cause anemia though he has had an acute change over the last few days in the setting of a chronic anemia so probably not from Jesus [name].  I believe iron studies are more consistent with anemia of chronic disease.   I would transfuse him as you are and monitor.  Continue PPI therapy.  There is no role for endoscopic evaluation at this point.  And should I suspected need for that arise, his difficult airway issues would require general anesthesia.  The compression of the esophagus by the metastatic squamous cell carcinoma could prevent adequate endoscopic evaluation of the upper GI tract as well though we do have some small diagnostic scopes if needed.  Jesus Boop, MD, [name] See Jesus [name] on call - gastroenterology for best contact person 07/29/2023 2:20 PM    HPI:   Jesus [name] is a 68 y.o. 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  He was more dyspneic so he presented to the ED.  Hemoglobin was 5.8 and stool was heme positive.  The patient is red-green color blind he has not seen any bleeding he thinks his stools might have been darker at times.  He does take an iron solution through his gastrostomy tube.  He denies history of peptic ulcer disease.  He is not using any NSAIDs.  Denies altered bowel habits or abdominal pain or vomiting.  He has had hemoptysis in the past but not lately though today he is suctioning some bloody fluid from the oral cavity.  He is n.p.o. at home, and unable to clear saliva without suctioning.  He may swish some liquids or suck on a peppermint candy but is unable to tolerate anything by mouth.  He says he was taking a preparation of PPI (question pantoprazole) that was able to be delivered through the gastrostomy tube.  Pantoprazole or Protonix tablets were not to be crushed so he was not using those.  From most recent CT angio of the chest 07/15/2023 Mediastinum/Nodes: No enlarged mediastinal, hilar, or axillary lymph nodes. A stable appearing area of soft tissue attenuation is seen at the thoracic inlet. This extends into the superior mediastinum and subsequently surrounds the trachea and esophagus. Tracheal narrowing is again  seen at the level of the thoracic inlet.      Latest Ref Rng & Units 07/29/2023    6:28 AM 07/25/2023   11:55 AM 07/17/2023    3:13 AM  CBC  WBC 4.0 - 10.5 K/uL 15.5  12.3  13.4   Hemoglobin 13.0 - 17.0 g/dL 5.8  8.4  7.4   Hematocrit 39.0 - 52.0 % 19.5  27.3  26.0   Platelets 150 - 400 K/uL 706  514  479     Lab Results  Component Value Date    IRON 11 (L) 07/25/2023   TIBC 293 07/25/2023   FERRITIN 305 07/25/2023   Lab Results  Component Value Date   VITAMINB12 750 07/25/2023    He had a colonoscopy in 2020 by Dr. Adela [name] with diverticulosis and a 4 mm tubular adenoma plus internal hemorrhoids.  He has a brother older than the age of 59 that had colon cancer.  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- last radiation 06-2018   Cataract    removed both eyes with l;ens implants    COPD (chronic obstructive pulmonary disease) (HCC)    Dyspnea    Family history of colon cancer    in his brother- dx'd age 24    History of chemotherapy    last 06-01-2018   History of kidney stones    History of radiation therapy    last radiation 06-2018   Hyperlipidemia    currently under control   Hypertension    Irregular heart beats    Lymphadenopathy    Pain, lower leg    Bilateral   Peripheral arterial disease (HCC)    Pre-diabetes    Red-green color blindness    RLS (restless legs syndrome)    Snores    Wears glasses     Past Surgical History:  Procedure Laterality Date   CATARACT EXTRACTION W/ INTRAOCULAR LENS  IMPLANT, BILATERAL     COLONOSCOPY     DIRECT LARYNGOSCOPY Right 02/03/2022   Procedure: DIRECT LARYNGOSCOPY WITH BIOPSY OF RIGHT FALSE VOCAL CORD;  Surgeon: Jesus Coho, MD;  Location: [facility] OR;  Service: ENT;  Laterality: Right;   dislodged salava stone     ESOPHAGOSCOPY WITH DILITATION N/A 07/19/2023   Procedure: ESOPHAGOSCOPY, WITH DILATION;  Surgeon: Jesus Colonel, MD;  Location: [facility] OR;  Service: ENT;  Laterality: N/A;   FRACTURE SURGERY     HAND ARTHROPLASTY  1995   crushed left hand   INGUINAL LYMPH NODE BIOPSY Left 01/02/2018   Procedure: LEFT INGUINAL LYMPH NODE BIOPSY;  Surgeon: Jesus Loron, MD;   Location: [facility] OR;  Service: General;  Laterality: Left;   IR IMAGING GUIDED PORT INSERTION  01/15/2018   IR IMAGING GUIDED PORT INSERTION  08/10/2022   IR REMOVAL TUN ACCESS W/ PORT W/O FL MOD SED  03/11/2019   IR REPLACE G-TUBE SIMPLE WO FLUORO  06/21/2023   LAPAROSCOPIC INSERTION GASTROSTOMY TUBE N/A 06/19/2023   Procedure: LAPAROSCOPIC INSERTION GASTROSTOMY TUBE;  Surgeon: Quentin Ore, MD;  Location: [facility] ORS;  Service: General;  Laterality: N/A;   LAPAROSCOPIC INSERTION GASTROSTOMY TUBE N/A 06/21/2023   Procedure: LAPAROSCOPIC INSERTION GASTROSTOMY TUBE;  Surgeon: Quentin Ore, MD;  Location: [facility] ORS;  Service:  General;  Laterality: N/A;   MEDIASTINOSCOPY N/A 07/22/2022   Procedure: MEDIASTINOSCOPY;  Surgeon: Loreli Slot, MD;  Location: [facility] OR;  Service: Thoracic;  Laterality: N/A;   MICROLARYNGOSCOPY Left 01/17/2014   Procedure: MICROLARYNGOSCOPY WITH EXCISION OF THE BIOPSY OF LEFT VOCAL CORD LESION;  Surgeon: Jesus Colonel, MD;  Location: [facility];  Service: ENT;  Laterality: Left;   MICROLARYNGOSCOPY N/A 09/16/2020   Procedure: MICROLARYNGOSCOPY with Biopsy of vocal cord lesion;  Surgeon: Jesus Coho, MD;  Location: [facility] OR;  Service: ENT;  Laterality: N/A;   ORIF FOOT FRACTURE  2005   left   REFRACTIVE SURGERY Right    removed cloudiness in right eye after cataract removal     Family History  Problem Relation Age of Onset   Breast cancer Mother    Diabetes Father    Hypertension Father    Stroke Father    Mental illness Sister    Hypertension Daughter    Mental illness Daughter    Hypertension Brother    Colon cancer Brother 48       passed away 12/22/2018   Breast cancer Sister    Esophageal cancer Neg Hx    Colon polyps Neg Hx    Rectal cancer Neg Hx    Stomach cancer Neg Hx     Social History   Tobacco Use   Smoking status: Former    Current packs/day: 0.00    Average packs/day: 0.5 packs/day for 36.0 years (18.0 ttl pk-yrs)    Types:  Cigarettes    Start date: 09/28/1984    Quit date: 09/28/2020    Years since quitting: 2.8   Smokeless tobacco: Never   Tobacco comments:    he denies smoking in about 2 weeks 07/13/18  Vaping Use   Vaping status: Never Used  Substance Use Topics   Alcohol use: Not Currently    Alcohol/week: 6.0 standard drinks of alcohol    Types: 6 Cans of beer per week    Comment: Last use: Friday 30th   Drug use: Yes    Types: Marijuana, Cocaine    Comment: "Edibles"    Prior to Admission medications   Medication Sig Start Date End Date Taking? Authorizing Provider  acetaminophen (TYLENOL) 160 MG/5ML solution Place 20.3 mLs (650 mg total) into feeding tube every 6 (six) hours as needed for mild pain (pain score 1-3). 06/20/23   Maczis, Elmer Sow, PA-C  albuterol (VENTOLIN HFA) 108 (90 Base) MCG/ACT inhaler Inhale 2 puffs into the lungs every 6 (six) hours as needed for wheezing or shortness of breath. 07/05/23   Marcelino Duster, MD  ferrous sulfate 220 (44 Fe) MG/5ML solution Place 6.8 mLs (300 mg total) into feeding tube every other day. 07/17/23   Danford, Earl Lites, MD  glycopyrrolate (ROBINUL) 1 MG tablet Place 1 tablet (1 mg total) into feeding tube every other day. Crush tablet, place in water, and then place in feeding tube. 07/17/23   Danford, Earl Lites, MD  lidocaine-prilocaine (EMLA) cream Apply to affected area once 08/04/22   Johney Maine, MD  Nutritional Supplements (NUTREN 1.5) LIQD Give 2 cartons 4 times a day.  Flush with 30ml of water before and after each feeding.  Give additional of water TID for additional hydration. 06/26/23   Johney Maine, MD  oxyCODONE (ROXICODONE) 5 MG/5ML solution Place 5 mLs (5 mg total) into feeding tube every 6 (six) hours as needed for breakthrough pain. 06/20/23   Maczis, Elmer Sow,  PA-C  pantoprazole (PROTONIX) 40 MG tablet Take 1 tablet (40 mg total) by mouth daily. 07/05/23   Marcelino Duster, MD  Protein (FEEDING  SUPPLEMENT, PROSOURCE TF20,) liquid Place 60 mLs into feeding tube daily. 07/06/23   Marcelino Duster, MD  Water For Irrigation, Sterile (STERILE WATER) Irrigate with 125 mLs as directed every 4 (four) hours while awake.    [provider]    Current Facility-Administered Medications  Medication Dose Route Frequency Provider Last Rate Last Admin   acetaminophen (TYLENOL) tablet 650 mg  650 mg Oral Q6H PRN Bobette Mo, MD       Or   acetaminophen (TYLENOL) suppository 650 mg  650 mg Rectal Q6H PRN Bobette Mo, MD       Chlorhexidine Gluconate Cloth 2 % PADS 6 each  6 each Topical Daily Bobette Mo, MD   6 each at 07/29/23 1303   insulin aspart (novoLOG) injection 0-20 Units  0-20 Units Subcutaneous TID WC Bobette Mo, MD   15 Units at 07/29/23 1309   [START ON 07/30/2023] methylPREDNISolone sodium succinate (SOLU-MEDROL) 40 mg/mL injection 40 mg  40 mg Intravenous Daily Bobette Mo, MD       Followed by   Melene Muller ON 08/03/2023] predniSONE (DELTASONE) tablet 40 mg  40 mg Oral Q breakfast Bobette Mo, MD       ondansetron [facility]) tablet 4 mg  4 mg Oral Q6H PRN Bobette Mo, MD       Or   ondansetron [facility]) injection 4 mg  4 mg Intravenous Q6H PRN Bobette Mo, MD       Oral care mouth rinse  15 mL Mouth Rinse PRN Bobette Mo, MD       pantoprazole (PROTONIX) injection 40 mg  40 mg Intravenous Q12H Bobette Mo, MD       sodium chloride flush (NS) 0.9 % injection 10-40 mL  10-40 mL Intracatheter Q12H Bobette Mo, MD       sodium chloride flush (NS) 0.9 % injection 10-40 mL  10-40 mL Intracatheter PRN Bobette Mo, MD       Facility-Administered Medications Ordered in Other Encounters  Medication Dose Route Frequency Provider Last Rate Last Admin   sodium chloride flush (NS) 0.9 % injection 10 mL  10 mL Intracatheter PRN Johney Maine, MD   10 mL at 06/13/23 1745    Allergies as of  07/29/2023 - Review Complete 07/29/2023  Allergen Reaction Noted   Bee venom Anaphylaxis 12/14/2014   Lisinopril Swelling 01/11/2023   Antifungal [miconazole nitrate] Other (See Comments) 07/01/2020   Zolpidem tartrate er Other (See Comments) 07/01/2020     Review of Systems:    This is positive for those things mentioned in the HPI, also positive for diffuse weakness. All other review of systems are negative.       Physical Exam:  Vital signs in last 24 hours: Temp:  [97.5 F (36.4 C)-98.5 F (36.9 C)] 97.5 F (36.4 C) (03/22 1216) Pulse Rate:  [89-116] 95 (03/22 1300) Resp:  [13-23] 19 (03/22 1300) BP: (125-157)/(79-105) 137/91 (03/22 1300) SpO2:  [97 %-100 %] 100 % (03/22 1300) Weight:  [101.3 kg] 101.3 kg (03/22 1137) Last BM Date :  (PTA)  General:  Well-developed, well-nourished and in no acute distress Eyes:  anicteric. ENT:   Mouth and posterior pharynx free of lesions.  Neck:   supple with fullness Lungs: There is expiratory stridor Heart:  S1S2, no  rubs, murmurs, gallops. Abdomen:  soft, non-tender, no hepatosplenomegaly, hernia, or mass and BS+.  Gastrostomy tube in the left upper quadrant.  Multiple other surgical scars. Rectal: Palpable external hemorrhoids and brown stool Extremities:   no edema Neuro:  A&O x 3.  Psych:  appropriate mood and  Affect.   Data Reviewed:   LAB RESULTS: Recent Labs    07/29/23 0628  WBC 15.5*  HGB 5.8*  HCT 19.5*  PLT 706*   BMET Recent Labs    07/29/23 0628  NA 128*  K 4.9  CL 83*  CO2 34*  GLUCOSE 505*  BUN 28*  CREATININE 0.91  CALCIUM 9.4   LFT No results for input(s): "PROT", "ALBUMIN", "AST", "ALT", "ALKPHOS", "BILITOT", "BILIDIR", "IBILI" in the last 72 hours. PT/INR Recent Labs    07/29/23 0628  LABPROT 14.4  INR 1.1    STUDIES: DG Chest Portable 1 View Result Date: 07/29/2023 CLINICAL DATA:  Expiratory wheezing and shortness of breath.  Fall EXAM: PORTABLE CHEST 1 VIEW COMPARISON:  None  Available. FINDINGS: The heart size and mediastinal contours are within normal limits. Both lungs are clear. The visualized skeletal structures are intact, with thoracic spondylosis. There is a right IJ port catheter with tip in the mid SVC. Unchanged. Compare: Overall aeration seems unchanged. IMPRESSION: No evidence of acute chest disease. Right IJ port catheter positioning is stable or. Electronically Signed   By: Almira Bar M.D.   On: 07/29/2023 07:25    CLINICAL DATA:  Multiple suspected pulmonary embolism.   EXAM: CT ANGIOGRAPHY CHEST WITH CONTRAST   TECHNIQUE: Multidetector CT imaging of the chest was performed using the standard protocol during bolus administration of intravenous contrast. Multiplanar CT image reconstructions and MIPs were obtained to evaluate the vascular anatomy.   RADIATION DOSE REDUCTION: This exam was performed according to the departmental dose-optimization program which includes automated exposure control, adjustment of the mA and/or kV according to patient size and/or use of iterative reconstruction technique.   CONTRAST:  75mL OMNIPAQUE IOHEXOL 350 MG/ML SOLN   COMPARISON:  July 02, 2023   FINDINGS: Cardiovascular: A right-sided venous Port-A-Cath is seen. Satisfactory opacification of the pulmonary arteries to the segmental level. No evidence of pulmonary embolism. Normal heart size. No pericardial effusion.   Mediastinum/Nodes: No enlarged mediastinal, hilar, or axillary lymph nodes. A stable appearing area of soft tissue attenuation is seen at the thoracic inlet. This extends into the superior mediastinum and subsequently surrounds the trachea and esophagus. Tracheal narrowing is again seen at the level of the thoracic inlet.   Lungs/Pleura: There is no evidence of acute infiltrate, pleural effusion or pneumothorax.   Upper Abdomen: A partially visualized percutaneous gastrostomy tube is noted.   There are large bilateral simple  renal cysts.   Musculoskeletal: No chest wall abnormality. No acute or significant osseous findings.   Review of the MIP images confirms the above findings.   IMPRESSION: 1. No evidence of pulmonary embolism or other acute intrathoracic process. 2. Stable findings consistent with the patient's known laryngeal cancer. 3. Large bilateral simple renal cysts. No follow-up imaging is recommended. This recommendation follows ACR consensus guidelines: Management of the Incidental Renal Mass on CT: A White Paper of the ACR Incidental Findings Committee. J Am Coll Radiol [phone].     Electronically Signed   By: Aram Candela M.D.   On: 07/15/2023 01:35    Thanks   LOS: 0 days   @Nataniel Gasper  Sena Slate, MD, [facility] @  07/29/2023, 1:59 PM

## 2023-07-29 NOTE — ED Triage Notes (Addendum)
 Pt came in via EMS from Home w/ c/o of SOB for 12 plus hours. Pt has cancerous tumor in bronchis. Pt has raspy voice and expiratory wheezes during triage. Pt denies chest pain or tightness. Hx of COPD and asthma.   2 dounebs 5 albuteral 125 solumedrol 2g mag

## 2023-07-30 DIAGNOSIS — D6481 Anemia due to antineoplastic chemotherapy: Secondary | ICD-10-CM | POA: Diagnosis not present

## 2023-07-30 DIAGNOSIS — R710 Precipitous drop in hematocrit: Secondary | ICD-10-CM

## 2023-07-30 DIAGNOSIS — C329 Malignant neoplasm of larynx, unspecified: Secondary | ICD-10-CM

## 2023-07-30 DIAGNOSIS — T451X5A Adverse effect of antineoplastic and immunosuppressive drugs, initial encounter: Secondary | ICD-10-CM | POA: Diagnosis not present

## 2023-07-30 DIAGNOSIS — R195 Other fecal abnormalities: Secondary | ICD-10-CM | POA: Diagnosis not present

## 2023-07-30 DIAGNOSIS — K922 Gastrointestinal hemorrhage, unspecified: Secondary | ICD-10-CM | POA: Diagnosis not present

## 2023-07-30 LAB — CBC
HCT: 28.8 % — ABNORMAL LOW (ref 39.0–52.0)
Hemoglobin: 9 g/dL — ABNORMAL LOW (ref 13.0–17.0)
MCH: 25.7 pg — ABNORMAL LOW (ref 26.0–34.0)
MCHC: 31.3 g/dL (ref 30.0–36.0)
MCV: 82.3 fL (ref 80.0–100.0)
Platelets: 567 10*3/uL — ABNORMAL HIGH (ref 150–400)
RBC: 3.5 MIL/uL — ABNORMAL LOW (ref 4.22–5.81)
RDW: 16.6 % — ABNORMAL HIGH (ref 11.5–15.5)
WBC: 11.7 10*3/uL — ABNORMAL HIGH (ref 4.0–10.5)
nRBC: 0 % (ref 0.0–0.2)

## 2023-07-30 LAB — GLUCOSE, CAPILLARY
Glucose-Capillary: 233 mg/dL — ABNORMAL HIGH (ref 70–99)
Glucose-Capillary: 156 mg/dL — ABNORMAL HIGH (ref 70–99)
Glucose-Capillary: 245 mg/dL — ABNORMAL HIGH (ref 70–99)
Glucose-Capillary: 324 mg/dL — ABNORMAL HIGH (ref 70–99)

## 2023-07-30 LAB — COMPREHENSIVE METABOLIC PANEL
ALT: 16 U/L (ref 0–44)
AST: 13 U/L — ABNORMAL LOW (ref 15–41)
Albumin: 2.5 g/dL — ABNORMAL LOW (ref 3.5–5.0)
Alkaline Phosphatase: 93 U/L (ref 38–126)
Anion gap: 10 (ref 5–15)
BUN: 32 mg/dL — ABNORMAL HIGH (ref 8–23)
CO2: 33 mmol/L — ABNORMAL HIGH (ref 22–32)
Calcium: 9.4 mg/dL (ref 8.9–10.3)
Chloride: 89 mmol/L — ABNORMAL LOW (ref 98–111)
Creatinine, Ser: 0.97 mg/dL (ref 0.61–1.24)
GFR, Estimated: >60 mL/min (ref >60)
Glucose, Bld: 263 mg/dL — ABNORMAL HIGH (ref 70–99)
Potassium: 4.8 mmol/L (ref 3.5–5.1)
Sodium: 132 mmol/L — ABNORMAL LOW (ref 135–145)
Total Bilirubin: 0.5 mg/dL (ref 0.0–1.2)
Total Protein: 7.5 g/dL (ref 6.5–8.1)

## 2023-07-30 LAB — MAGNESIUM: Magnesium: 2.5 mg/dL — ABNORMAL HIGH (ref 1.7–2.4)

## 2023-07-30 LAB — HIV ANTIBODY (ROUTINE TESTING W REFLEX): HIV Screen 4th Generation wRfx: NONREACTIVE

## 2023-07-30 MED ORDER — OSMOLITE 1.2 CAL PO LIQD: 237.0000 mL | Freq: Two times a day (BID) | ORAL | Status: DC

## 2023-07-30 MED ORDER — INSULIN ASPART 100 UNIT/ML IJ SOLN
5.0000 [IU] | Freq: Once | INTRAMUSCULAR | Status: AC
  Administered 2023-07-30: 5 [IU] via SUBCUTANEOUS

## 2023-07-30 MED ORDER — OSMOLITE 1.2 CAL PO LIQD
474.0000 mL | Freq: Two times a day (BID) | ORAL | Status: DC
  Administered 2023-07-30 – 2023-07-31 (×2): 474 mL
  Filled 2023-07-30 (×2): qty 474

## 2023-07-30 NOTE — Progress Notes (Signed)
  Progress Note   Patient: Jesus [name] [id number]:[id number] DOB: November 27, 1955 DOA: 07/29/2023     1 DOS: the patient was seen and examined on 07/30/2023   Brief hospital course: Patient is a 68 y.o. male with medical history significant of seasonal allergies, cocaine abuse, chemotherapy-induced anemia, non-Hodgkin's lymphoma, history of radiation therapy, cataracts, COPD, nephrolithiasis, hyperlipidemia, hypertension, irregular heartbeat, peripheral arterial disease, prediabetes, red-green color blindness, restless leg syndrome, class I obesity, history of laryngeal cancer, status post G-tube placement who was recently on admission from 06/15/2023 until 06/24/2023 due to dysphagia secondary to laryngeal squamous cell carcinoma.he was readmitted on 03/22 due to respiratory distress and found to have severe anemia of 5.8.  Denied any frank bleeding episodes.  Glucose was also noted to be 505 on admission.  Patient required PRBC transfusion  Assessment and Plan:  Acute symptomatic anemia: Present on admission.  Hemoglobin on presentation was 5.8.  No obvious bleeding diathesis.  Status post PRBC transfusion.  Hemoglobin remained stable posttransfusion~9.0.  GI were consulted and recommendations appreciated.  No interventions planned.  Status post recent esophageal dilatation on 07/19/2023.  Other possibilities include medication induced.  Rande Lawman).  Defer to hematology for further management.  Continue with PPI.  Stage IV squamous cell laryngeal cancer with obstructive tracheal and esophageal compression.  Patient has required esophageal dilatation.  Patient is being followed up by oncology.  On treatment with Keytruda.  Palliative care follow-up for complex decision making.  Acute dysphagia secondary to squamous cell laryngeal cancer: Status post PEG tube  Thrombocytosis: Present admission improved with dilution to 567.  HFpEF: Looks adequately compensated at this time.  No evidence of any acute  exacerbation.  Stage II coccyx pressure injury: Wound care as per protocol by nursing team.     Subjective:  Patient continues to do clinically well.  No bleeding were reported.  GI input appreciated.  Patient is feeling clinically better.  He only complains of red flag symptoms with feeding.  Nurses given 1 bottle instead of 2 and that has been helpful with pain management.  He is status post PRBC transfusion.  Repeat hemoglobin remained stable at 9.0.  Physical Exam: Vitals:   07/30/23 0200 07/30/23 0400 07/30/23 0800 07/30/23 1200  BP: (!) 120/99 119/71    Pulse: 94 95    Resp: 18 17    Temp:  97.6 F (36.4 C) 97.8 F (36.6 C) (!) 97.5 F (36.4 C)  TempSrc:  Oral Oral Oral  SpO2: 97% 97%    Weight:      Height:       Generally, patient looks clinically well.  He is alert oriented x 3.  Not in any acute distress. Neck: Supple with no JVD. HEENT: Oral mucosa moist. Chest: Diminished breath sounds bilaterally.  No respiratory distress or use of assessor muscles Abdomen: PEG tube in place.  Soft nontender with no organomegaly.  Bowel sounds present CNS shows no obvious focal deficits. Skin negative for any new rash. He Data Reviewed: Hemoglobin 9.0, hematocrit 28, WBC 11.7, platelet count 567, sodium 132, potassium 4.8, chloride 89, bicarb 33, glucose 263, BUN 32, creatinine 0.97, magnesium 2.5, AST 13 ALT 16  Family Communication: No family at bedside at this time.  Disposition: TBD Status is: Inpatient Remains inpatient appropriate because: Pending improvement with patient's condition  Planned Discharge Destination: Home    Time spent: 34 minutes  Author: Lilia Pro, MD 07/30/2023 2:04 PM  For on call review [email or web address].

## 2023-07-30 NOTE — Plan of Care (Signed)
  Problem: Coping: Goal: Ability to adjust to condition or change in health will improve Outcome: Progressing   Problem: Fluid Volume: Goal: Ability to maintain a balanced intake and output will improve Outcome: Progressing   Problem: Health Behavior/Discharge Planning: Goal: Ability to identify and utilize available resources and services will improve Outcome: Progressing   Problem: Nutritional: Goal: Maintenance of adequate nutrition will improve Outcome: Progressing   Problem: Skin Integrity: Goal: Risk for impaired skin integrity will decrease Outcome: Not Progressing   Problem: Tissue Perfusion: Goal: Adequacy of tissue perfusion will improve Outcome: Progressing

## 2023-07-30 NOTE — Progress Notes (Signed)
   Patient Name: Jesus [name] Date of Encounter: 07/30/2023, 10:58 AM     Assessment and Plan  Severe anemia/decreased Hgb  Heme + brown stool  Metastatic laryngeal cancer causing tracheal and esophageal compression.  -----------------------------------------------------------------------------------------------------  Hgb sig better after transfusion No signs GI hemorrhage  I will sign off   Subjective  He denies bleeding   Objective  BP 119/71 (BP Location: Left Arm)   Pulse 95   Temp 97.8 F (36.6 C) (Oral)   Resp 17   Ht 6' (1.829 m)   Wt 101.3 kg   SpO2 97%   BMI 30.29 kg/m       Latest Ref Rng & Units 07/30/2023    3:53 AM 07/29/2023    7:30 PM 07/29/2023    6:28 AM  CBC  WBC 4.0 - 10.5 K/uL 11.7   15.5   Hemoglobin 13.0 - 17.0 g/dL 9.0  9.0  5.8   Hematocrit 39.0 - 52.0 % 28.8  28.9  19.5   Platelets 150 - 400 K/uL 567   706       Iva Boop, MD, [facility] See Loretha Stapler on call - gastroenterology for best contact person 07/30/2023 10:58 AM

## 2023-07-31 ENCOUNTER — Other Ambulatory Visit: Payer: Self-pay

## 2023-07-31 ENCOUNTER — Ambulatory Visit

## 2023-07-31 ENCOUNTER — Ambulatory Visit: Admitting: Radiation Oncology

## 2023-07-31 DIAGNOSIS — C4492 Squamous cell carcinoma of skin, unspecified: Secondary | ICD-10-CM | POA: Diagnosis not present

## 2023-07-31 DIAGNOSIS — J387 Other diseases of larynx: Secondary | ICD-10-CM

## 2023-07-31 DIAGNOSIS — D649 Anemia, unspecified: Secondary | ICD-10-CM | POA: Diagnosis not present

## 2023-07-31 DIAGNOSIS — C320 Malignant neoplasm of glottis: Secondary | ICD-10-CM | POA: Diagnosis not present

## 2023-07-31 DIAGNOSIS — C771 Secondary and unspecified malignant neoplasm of intrathoracic lymph nodes: Secondary | ICD-10-CM | POA: Diagnosis not present

## 2023-07-31 LAB — BPAM RBC
Blood Product Expiration Date: 202504202359
Blood Product Expiration Date: 202504272359
ISSUE DATE / TIME: 202503221153
ISSUE DATE / TIME: 202503221443
Unit Type and Rh: 6200
Unit Type and Rh: 6200

## 2023-07-31 LAB — BASIC METABOLIC PANEL
Anion gap: 12 (ref 5–15)
BUN: 45 mg/dL — ABNORMAL HIGH (ref 8–23)
CO2: 33 mmol/L — ABNORMAL HIGH (ref 22–32)
Calcium: 9.7 mg/dL (ref 8.9–10.3)
Chloride: 91 mmol/L — ABNORMAL LOW (ref 98–111)
Creatinine, Ser: 1.14 mg/dL (ref 0.61–1.24)
GFR, Estimated: >60 mL/min (ref >60)
Glucose, Bld: 122 mg/dL — ABNORMAL HIGH (ref 70–99)
Potassium: 4.5 mmol/L (ref 3.5–5.1)
Sodium: 136 mmol/L (ref 135–145)

## 2023-07-31 LAB — CBC
HCT: 32.3 % — ABNORMAL LOW (ref 39.0–52.0)
Hemoglobin: 9.9 g/dL — ABNORMAL LOW (ref 13.0–17.0)
MCH: 25.2 pg — ABNORMAL LOW (ref 26.0–34.0)
MCHC: 30.7 g/dL (ref 30.0–36.0)
MCV: 82.2 fL (ref 80.0–100.0)
Platelets: 643 10*3/uL — ABNORMAL HIGH (ref 150–400)
RBC: 3.93 MIL/uL — ABNORMAL LOW (ref 4.22–5.81)
RDW: 17.2 % — ABNORMAL HIGH (ref 11.5–15.5)
WBC: 11.6 10*3/uL — ABNORMAL HIGH (ref 4.0–10.5)
nRBC: 0 % (ref 0.0–0.2)

## 2023-07-31 LAB — TYPE AND SCREEN
ABO/RH(D): A POS
Antibody Screen: NEGATIVE
Unit division: 0
Unit division: 0

## 2023-07-31 LAB — GLUCOSE, CAPILLARY
Glucose-Capillary: 134 mg/dL — ABNORMAL HIGH (ref 70–99)
Glucose-Capillary: 176 mg/dL — ABNORMAL HIGH (ref 70–99)
Glucose-Capillary: 205 mg/dL — ABNORMAL HIGH (ref 70–99)
Glucose-Capillary: 319 mg/dL — ABNORMAL HIGH (ref 70–99)
Glucose-Capillary: 363 mg/dL — ABNORMAL HIGH (ref 70–99)
Glucose-Capillary: 443 mg/dL — ABNORMAL HIGH (ref 70–99)
Glucose-Capillary: 99 mg/dL (ref 70–99)

## 2023-07-31 MED ORDER — INSULIN GLARGINE 100 UNIT/ML SOLOSTAR PEN: 15.0000 [IU] | PEN_INJECTOR | Freq: Every day | SUBCUTANEOUS | 1 refills | Status: DC

## 2023-07-31 MED ORDER — LANCET DEVICE MISC
1.0000 | Freq: Three times a day (TID) | 0 refills | Status: AC
Start: 2023-07-31 — End: 2023-08-30

## 2023-07-31 MED ORDER — PROSOURCE TF20 ENFIT COMPATIBL EN LIQD
60.0000 mL | Freq: Every day | ENTERAL | Status: DC
  Administered 2023-07-31: 60 mL
  Filled 2023-07-31: qty 60

## 2023-07-31 MED ORDER — BLOOD GLUCOSE TEST VI STRP: 1.0000 | ORAL_STRIP | Freq: Three times a day (TID) | 0 refills | Status: AC

## 2023-07-31 MED ORDER — FREE WATER
360.0000 mL | Freq: Three times a day (TID) | Status: DC
  Administered 2023-07-31: 360 mL

## 2023-07-31 MED ORDER — BLOOD GLUCOSE MONITORING SUPPL DEVI: 1.0000 | Freq: Three times a day (TID) | 0 refills | Status: DC

## 2023-07-31 MED ORDER — OSMOLITE 1.5 CAL PO LIQD
474.0000 mL | Freq: Four times a day (QID) | ORAL | Status: DC
  Administered 2023-07-31: 474 mL
  Filled 2023-07-31 (×2): qty 474

## 2023-07-31 MED ORDER — INSULIN ASPART 100 UNIT/ML IJ SOLN
10.0000 [IU] | Freq: Once | INTRAMUSCULAR | Status: AC
  Administered 2023-07-31: 10 [IU] via SUBCUTANEOUS

## 2023-07-31 MED ORDER — PEN NEEDLE/5-BEVEL TIP 32G X 4 MM MISC: 1.0000 | Freq: Every evening | 1 refills | Status: DC

## 2023-07-31 MED ORDER — LANCETS MISC. MISC
1.0000 | Freq: Three times a day (TID) | 0 refills | Status: AC
Start: 2023-07-31 — End: 2023-08-30

## 2023-07-31 NOTE — TOC Transition Note (Signed)
 Transition of Care [facility]) - Discharge Note   Patient Details  Name: Jesus [name] MRN: [id number] Date of Birth: April 23, 1956  [facility] CM/SW Contact:  Larrie Kass, LCSW Phone Number: 07/31/2023, 4:29 PM   Clinical Narrative:     Pt to d/c with home health services , pt is active with [facility] for home health PT will need orders MD made aware. TOC sign off.   Final next level of care: Home w Home Health Services Barriers to Discharge: Barriers Resolved   Patient Goals and CMS Choice            Discharge Placement                       Discharge Plan and Services Additional resources added to the After Visit Summary for                                       Social Drivers of Health (SDOH) Interventions SDOH Screenings   Food Insecurity: No Food Insecurity (07/29/2023)  Housing: High Risk (07/29/2023)  Transportation Needs: Unmet Transportation Needs (07/29/2023)  Utilities: Not At Risk (07/29/2023)  Depression (PHQ2-9): Low Risk  (11/09/2022)  Financial Resource Strain: Low Risk  (10/06/2020)  Social Connections: Socially Isolated (07/29/2023)  Tobacco Use: Medium Risk (07/29/2023)     Readmission Risk Interventions    07/31/2023    4:28 PM 07/17/2023    3:08 PM 07/05/2023   11:49 AM  Readmission Risk Prevention Plan  Transportation Screening Complete Complete Complete  Medication Review Oceanographer) Complete Complete Complete  PCP or Specialist appointment within 3-5 days of discharge Complete Complete Complete  HRI or Home Care Consult Complete Complete Complete  SW Recovery Care/Counseling Consult Complete Complete Complete  Palliative Care Screening Not Applicable Not Applicable Not Applicable  Skilled Nursing Facility Not Applicable Not Applicable Not Applicable

## 2023-07-31 NOTE — Evaluation (Signed)
 Occupational Therapy Evaluation Patient Details Name: Jesus [name] MRN: [id number] DOB: 1955/12/17 Today's Date: 07/31/2023   History of Present Illness    Jesus [name] is a 68 year old male re-admitted to [facility] on 03/22 due to respiratory distress and found to have severe anemia of 5. [id number] allergies, cocaine abuse, chemotherapy-induced anemia, non-Hodgkin's lymphoma, history of radiation therapy, cataracts, COPD, nephrolithiasis, hyperlipidemia, hypertension, irregular heartbeat, peripheral arterial disease, prediabetes, red-green color blindness, restless leg syndrome, class I obesity, history of laryngeal cancer, status post G-tube placement who was recently on admission from 06/15/2023 until 06/24/2023 due to dysphagia secondary to laryngeal squamous cell carcinoma.     Clinical Impressions  PTA, patient was independent living with family who assists with higher level IADL's as needed. See below and ADL section for details. Patient with higher level activity tolerance deficits but abl to complete BADL's and mobility without AD for ambulation. Prior admission, OT recommended tub transfer bench which patient did not obtain. OT recommending tub transfer bench this admission as well for bathing safety and ECT and support from family upon discharge. No further Acute care OT needs and no follow up recommendations for therapy. OT signing off services this session.     If plan is discharge home, recommend the following:   Assistance with cooking/housework     Functional Status Assessment   Patient has had a recent decline in their functional status and demonstrates the ability to make significant improvements in function in a reasonable and predictable amount of time.     Equipment Recommendations   Tub/shower seat      Precautions/Restrictions   Precautions Precautions: Fall (watch O2) Restrictions Weight Bearing Restrictions Per Provider Order: No     Mobility Bed  Mobility               General bed mobility comments: up in recliner    Transfers Overall transfer level: Independent                        Balance Overall balance assessment: No apparent balance deficits (not formally assessed)                                         ADL either performed or assessed with clinical judgement   ADL Overall ADL's : At baseline                                       General ADL Comments: Patient already up in recliner upon OT arrival. Able to amb to and from bathroom for toileting without AD without assist. Patient stood for handwashing with indep and demonstrated LB self care by using figure 4 technique sitting and able to manage elastic wasit pants independently. Patient reports he self administers his Peg tube feeds alone but will be haivng his next 3 courses of chemo this week and fears he may be weaker. Last hositpa stay OT rec TTB and this OT also recommending for ECT and shower safety. OT issued and trained in reacher use as patient reports he needs to complete some higher level IADL's and unable to access out of pocket. ECT strategies reinforced with + teach back.     Vision Baseline Vision/History: 1 Wears glasses Ability to See in Adequate Light: 0 Adequate  Patient Visual Report: No change from baseline Vision Assessment?: No apparent visual deficits     Perception Perception: Within Functional Limits       Praxis Praxis: WFL       Pertinent Vitals/Pain Pain Assessment Pain Assessment: 0-10 Pain Score: 5  Pain Location: headache Pain Descriptors / Indicators: Headache Pain Intervention(s): RN gave pain meds during session     Extremity/Trunk Assessment Upper Extremity Assessment Upper Extremity Assessment: Overall WFL for tasks assessed   Lower Extremity Assessment Lower Extremity Assessment: Overall WFL for tasks assessed   Cervical / Trunk Assessment Cervical / Trunk  Assessment: Normal   Communication Communication Communication: No apparent difficulties   Cognition Arousal: Alert Behavior During Therapy: WFL for tasks assessed/performed Cognition: No apparent impairments             OT - Cognition Comments: reports feeling slower                 Following commands: Intact       Cueing  General Comments   Cueing Techniques: Verbal cues  no skin issues noted           Home Living Family/patient expects to be discharged to:: Private residence Living Arrangements: Children Available Help at Discharge: Available PRN/intermittently Type of Home: House Home Access: Stairs to enter [employer] of Steps: 6 Entrance Stairs-Rails: Right;Left Home Layout: Two level;Full bath on main level;Able to live on main level with bedroom/bathroom     Bathroom Shower/Tub: Tub/shower unit;Curtain   Bathroom Toilet: Handicapped height     Home Equipment: Hand held shower head;Other (comment) (walking stick)          Prior Functioning/Environment Prior Level of Function : Independent/Modified Independent;Driving             Mobility Comments: hx of shower fall ADLs Comments: self administers tube feeds    OT Problem List: Decreased activity tolerance   OT Treatment/Interventions:        OT Goals(Current goals can be found in the care plan section)   Acute Rehab OT Goals Patient Stated Goal: to get home and start my radiation OT Goal Formulation: All assessment and education complete, DC therapy   AM-PAC OT "6 Clicks" Daily Activity     Outcome Measure Help from another person eating meals?: None Help from another person taking care of personal grooming?: None Help from another person toileting, which includes using toliet, bedpan, or urinal?: None Help from another person bathing (including washing, rinsing, drying)?: None     6 Click Score: 16   End of Session Equipment Utilized During Treatment: Gait  belt Nurse Communication: Patient requests pain meds;Mobility status  Activity Tolerance: Patient tolerated treatment well Patient left: in chair;with call bell/phone within reach;with chair alarm set;with nursing/sitter in room  OT Visit Diagnosis: History of falling (Z91.81)                Time: 8295-6213 OT Time Calculation (min): 36 min Charges:  OT General Charges $OT Visit: 1 Visit OT Evaluation $OT Eval Low Complexity: 1 Low OT Treatments $Self Care/Home Management : 8-22 mins Maico Mulvehill OT/L Acute Rehabilitation Department  [phone]  07/31/2023, 1:11 PM

## 2023-07-31 NOTE — Progress Notes (Signed)
 Initial Nutrition Assessment  DOCUMENTATION CODES:   Non-severe (moderate) malnutrition in context of chronic illness  INTERVENTION:  - Initiate home TF regimen via G-tube as below: 2 cartons of Osmolite 1.5 @ 0600, 1100, 1600, and 2100 (8 cartons/day) + ProSource TF20 60mL daily (will not need on discharge) +31mL FWF before and after each TF bolus +358mL FWF TID (between feeds) Provides 2924 kcals, 139g protein, and 2758mL/day (including TF and FWF)  - Monitor magnesium, potassium, and phosphorus, MD to replete as needed.  - Monitor weight trends.  - For discharge, would recommend switching back to formula Nutren 1.5 (continue 8 cartons/day)   NUTRITION DIAGNOSIS:   Moderate Malnutrition related to chronic illness, cancer and cancer related treatments as evidenced by mild fat depletion, mild muscle depletion, percent weight loss (32.5% in 1 year).  GOAL:   Patient will meet greater than or equal to 90% of their needs  MONITOR:   Labs, Weight trends, TF tolerance  REASON FOR ASSESSMENT:   Consult Calorie Count  ASSESSMENT:   68 y.o. 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  Patient well known to this RD from recent admit last month.  Patient has continued to lose weight and is at his lowest weight of 217# this admission. He was weighed at 322# 1 year ago which indicates a 105# or 32.5% weight loss over the past 1 year. This is significant and severe for the time frame.  He has continued to lose despite being on tube feeds, although weight loss has slowed. However, he just had G-tube placed last month and has had frequent admissions so  not getting consistent intake.   Patient follows with outpatient cancer RD. His home TF regimen has been 2 cartons of Nutren 1.5 4x/day (8 cartons/day). Gets 30mL before and after each feeding and TID. This provides a total of 3040 kcals, 136g protein, and 2855mL/day (TF and flushes).   Patient confirms this is the regimen he has been doing at home. Tolerates well. Does not want to change his regimen. Confirms he does not eat anything by mouth.  Patient was initially ordered 2 cartons of Glucerna 1.2 BID at the beginning of admission. Was changed to 2 cartons Osmolite 1.2 BID yesterday.   Discussed with attending MD. Can restart patient's home TF regimen today.  RN notes patient with high blood sugars (443) during visit. MD aware. Working on getting blood sugars under control.    Medications reviewed and include: FWF TID  Labs reviewed:  HA1C 8.9 Blood Glucose 134-443 x24 hours   NUTRITION - FOCUSED PHYSICAL EXAM:  Flowsheet Row Most Recent Value  Orbital Region Mild depletion  Upper Arm Region No depletion  Thoracic and Lumbar Region No depletion  Buccal Region Mild depletion  Temple Region Moderate depletion  Clavicle Bone Region No depletion  Clavicle and Acromion Bone Region Mild depletion  Scapular Bone Region Unable to assess  Dorsal Hand No depletion  Patellar Region No depletion  Anterior Thigh Region No depletion  Posterior Calf Region No depletion  Edema (RD Assessment) Mild  Hair Reviewed  Eyes Reviewed  Mouth Reviewed  Skin Reviewed  Nails Reviewed       Diet Order:  Diet Order             Diet NPO time specified  Diet effective now                   EDUCATION NEEDS:  Education needs have been addressed  Skin:  Skin Assessment: Skin Integrity Issues: Skin Integrity Issues:: Stage II Stage II: Mid Coccyx  Last BM:  3/20  Height:  Ht Readings from Last 1 Encounters:  07/30/23 6' (1.829 m)   Weight:  Wt Readings from Last 1  Encounters:  07/30/23 98.8 kg   Ideal Body Weight:  80.91 kg  BMI:  Body mass index is 29.54 kg/m.  Estimated Nutritional Needs:  Kcal:  2750-3150 kcals Protein:  120-140 grams Fluid:  >/= 2.8L    Shelle Iron RD, LDN Contact via Secure Chat.

## 2023-07-31 NOTE — Discharge Summary (Signed)
 Jesus [name] HQI:[id number] DOB: Feb 29, 1956 DOA: 07/29/2023  PCP: Noberto Retort, MD  Admit date: 07/29/2023 Discharge date: 07/31/2023  Time spent: 35 minutes  Recommendations for Outpatient Follow-up:  Pcp f/u Oncology f/u next week as scheduled     Discharge Diagnoses:  Principal Problem:   GI bleed Active Problems:   HTN (hypertension)   Mixed hyperlipidemia   Type 2 diabetes mellitus with hyperglycemia (HCC)   Thrombocytosis   Grade I diastolic dysfunction   Aortic atherosclerosis (HCC)   Pressure injury of skin   Acute on chronic blood loss anemia   Pseudohyponatremia   Discharge Condition: stable  Diet recommendation: tube feeds  Filed Weights   07/29/23 1137 07/30/23 1510  Weight: 101.3 kg 98.8 kg    History of present illness:   Jesus [name] is a 68 y.o. male with medical history significant of seasonal allergies, cocaine abuse, chemotherapy-induced anemia, non-Hodgkin's lymphoma, history of radiation therapy, cataracts, COPD, nephrolithiasis, hyperlipidemia, hypertension, irregular heartbeat, peripheral arterial disease, prediabetes, red-green color blindness, restless leg syndrome, class I obesity, history of laryngeal cancer, status post G-tube placement who was admitted from 06/15/2023 until 06/24/2023 due to dysphagia secondary to laryngeal squamous cell carcinoma, then readmitted due to dyspnea secondary to tracheal narrowing from 07/14/2023 until 07/17/2023 who returned today to the emergency department via EMS due to respiratory distress.  He received 2 DuoNebs, albuterol 5 mg neb, Solu-Medrol 125 mg IVP and magnesium sulfate 2 g IVPB. He denied fever, chills, rhinorrhea or hemoptysis.  No chest pain, palpitations, diaphoresis, PND, orthopnea or pitting edema of the lower extremities.  No abdominal pain, nausea, emesis, diarrhea, constipation, melena or hematochezia.  No flank pain, dysuria, frequency or hematuria.  No polyuria, polydipsia, polyphagia or  blurred vision.   Hospital Course:  Patient presents with shortness of breath. Found to have hgb of 5.8. transfused 2 units. Also with hyperglycemia. Was given IV steroids for possible copd exacerbation. Hgb stable in 9s. Gi consulted, deferred additional evaluation given stable hgb and no signs bleeding. Normal bili points against hemolysis. Has oncology f/u in one week, consider anti-neoplastic med side effect as cause, such as bone marrow suppression. For hyperglycemia which was present on admission and exacerbated by steroids (which we will hold at discharge) advise starting lantus and titrating to mild hyperglycemia at home, will need close pcp f/u.   Procedures: none   Consultations: GI  Discharge Exam: Vitals:   07/31/23 0303 07/31/23 1238  BP: 113/80 103/82  Pulse: 97 (!) 102  Resp: 18   Temp:  (!) 97.5 F (36.4 C)  SpO2: 95% 99%    General: NAD Cardiovascular: RRR Respiratory: CTAB save for few scattered rhonchi Abd: soft, g tube c/d/i  Discharge Instructions   Discharge Instructions     Diet - low sodium heart healthy   Complete by: As directed    Discharge wound care:   Complete by: As directed    Keep dressed   Increase activity slowly   Complete by: As directed       Allergies as of 07/31/2023       Reactions   Bee Venom Anaphylaxis   Lisinopril Swelling, Other (See Comments)   Lips became very swollen   Antifungal [miconazole Nitrate] Other (See Comments)   Headaches   Zolpidem Tartrate Er Other (See Comments)   Leg cramps        Medication List     TAKE these medications    acetaminophen 160 MG/5ML solution Commonly known as:  TYLENOL Place 20.3 mLs (650 mg total) into feeding tube every 6 (six) hours as needed for mild pain (pain score 1-3).   albuterol 108 (90 Base) MCG/ACT inhaler Commonly known as: VENTOLIN HFA Inhale 2 puffs into the lungs every 6 (six) hours as needed for wheezing or shortness of breath. What changed: when to take  this   Blood Glucose Monitoring Suppl Devi 1 each by Does not apply route in the morning, at noon, and at bedtime. May substitute to any manufacturer covered by patient's insurance.   BLOOD GLUCOSE TEST STRIPS Strp 1 each by In Vitro route in the morning, at noon, and at bedtime. May substitute to any manufacturer covered by patient's insurance.   feeding supplement (PROSource TF20) liquid Place 60 mLs into feeding tube daily.   ferrous sulfate 220 (44 Fe) MG/5ML solution Place 6.8 mLs (300 mg total) into feeding tube every other day.   glycopyrrolate 1 MG tablet Commonly known as: ROBINUL Place 1 tablet (1 mg total) into feeding tube every other day. Crush tablet, place in water, and then place in feeding tube. What changed:  when to take this additional instructions   insulin glargine 100 UNIT/ML Solostar Pen Commonly known as: LANTUS Inject 15 Units into the skin at bedtime.   Lancet Device Misc 1 each by Does not apply route in the morning, at noon, and at bedtime. May substitute to any manufacturer covered by patient's insurance.   Lancets Misc. Misc 1 each by Does not apply route in the morning, at noon, and at bedtime. May substitute to any manufacturer covered by patient's insurance.   lidocaine-prilocaine cream Commonly known as: EMLA Apply to affected area once What changed:  how much to take how to take this when to take this reasons to take this additional instructions   Nutren 1.5 Liqd Give 2 cartons 4 times a day.  Flush with 30ml of water before and after each feeding.  Give additional of water TID for additional hydration.   oxyCODONE 5 MG/5ML solution Commonly known as: ROXICODONE Place 5 mLs (5 mg total) into feeding tube every 6 (six) hours as needed for breakthrough pain.   pantoprazole sodium 40 mg Commonly known as: PROTONIX Place 40 mg into feeding tube daily in the afternoon. What changed: Another medication with the same name was  removed. Continue taking this medication, and follow the directions you see here.   Pen Needle/5-Bevel Tip 32G X 4 MM Misc 1 each by Does not apply route at bedtime.   sterile water Irrigate with 125 mLs as directed every 4 (four) hours while awake.               Discharge Care Instructions  (From admission, onward)           Start     Ordered   07/31/23 0000  Discharge wound care:       Comments: Keep dressed   07/31/23 1558           Allergies  Allergen Reactions   Bee Venom Anaphylaxis   Lisinopril Swelling and Other (See Comments)    Lips became very swollen   Antifungal [Miconazole Nitrate] Other (See Comments)    Headaches    Zolpidem Tartrate Er Other (See Comments)    Leg cramps    Follow-up Information     Noberto Retort, MD Follow up.   Specialty: Family Medicine Contact information: [phone] [place] [address] [phone]  The results of significant diagnostics from this hospitalization (including imaging, microbiology, ancillary and laboratory) are listed below for reference.    Significant Diagnostic Studies: DG Chest Portable 1 View Result Date: 07/29/2023 CLINICAL DATA:  Expiratory wheezing and shortness of breath.  Fall EXAM: PORTABLE CHEST 1 VIEW COMPARISON:  None Available. FINDINGS: The heart size and mediastinal contours are within normal limits. Both lungs are clear. The visualized skeletal structures are intact, with thoracic spondylosis. There is a right IJ port catheter with tip in the mid SVC. Unchanged. Compare: Overall aeration seems unchanged. IMPRESSION: No evidence of acute chest disease. Right IJ port catheter positioning is stable or. Electronically Signed   By: Almira Bar M.D.   On: 07/29/2023 07:25   CT Angio Chest PE W and/or Wo Contrast Result Date: 07/15/2023 CLINICAL DATA:  Multiple suspected pulmonary embolism. EXAM: CT ANGIOGRAPHY CHEST WITH CONTRAST TECHNIQUE:  Multidetector CT imaging of the chest was performed using the standard protocol during bolus administration of intravenous contrast. Multiplanar CT image reconstructions and MIPs were obtained to evaluate the vascular anatomy. RADIATION DOSE REDUCTION: This exam was performed according to the departmental dose-optimization program which includes automated exposure control, adjustment of the mA and/or kV according to patient size and/or use of iterative reconstruction technique. CONTRAST:  75mL OMNIPAQUE IOHEXOL 350 MG/ML SOLN COMPARISON:  July 02, 2023 FINDINGS: Cardiovascular: A right-sided venous Port-A-Cath is seen. Satisfactory opacification of the pulmonary arteries to the segmental level. No evidence of pulmonary embolism. Normal heart size. No pericardial effusion. Mediastinum/Nodes: No enlarged mediastinal, hilar, or axillary lymph nodes. A stable appearing area of soft tissue attenuation is seen at the thoracic inlet. This extends into the superior mediastinum and subsequently surrounds the trachea and esophagus. Tracheal narrowing is again seen at the level of the thoracic inlet. Lungs/Pleura: There is no evidence of acute infiltrate, pleural effusion or pneumothorax. Upper Abdomen: A partially visualized percutaneous gastrostomy tube is noted. There are large bilateral simple renal cysts. Musculoskeletal: No chest wall abnormality. No acute or significant osseous findings. Review of the MIP images confirms the above findings. IMPRESSION: 1. No evidence of pulmonary embolism or other acute intrathoracic process. 2. Stable findings consistent with the patient's known laryngeal cancer. 3. Large bilateral simple renal cysts. No follow-up imaging is recommended. This recommendation follows ACR consensus guidelines: Management of the Incidental Renal Mass on CT: A White Paper of the ACR Incidental Findings Committee. J Am Coll Radiol [phone]. Electronically Signed   By: Aram Candela M.D.   On:  07/15/2023 01:35   DG Chest Portable 1 View Result Date: 07/14/2023 CLINICAL DATA:  Worsening shortness of breath. EXAM: PORTABLE CHEST 1 VIEW COMPARISON:  July 02, 2023 FINDINGS: There is stable right-sided venous Port-A-Cath positioning. The heart size and mediastinal contours are within normal limits. There is mild right infrahilar atelectasis and/or infiltrate. No pleural effusion or pneumothorax is identified. Multilevel degenerative changes are seen throughout the thoracic spine. IMPRESSION: Mild right infrahilar atelectasis and/or infiltrate. Electronically Signed   By: Aram Candela M.D.   On: 07/14/2023 23:41   ECHOCARDIOGRAM COMPLETE Result Date: 07/03/2023    ECHOCARDIOGRAM REPORT   Patient Name:   JOZEF EISENBEIS Date of Exam: 07/03/2023 Medical Rec #:  [id number]      Height:       72.0 in Accession #:    [id number]     Weight:       242.5 lb Date of Birth:  Apr 10, 1956     BSA:  2.312 m Patient Age:    68 years       BP:           117/69 mmHg Patient Gender: M              HR:           96 bpm. Exam Location:  [facility] Procedure: 2D Echo, Cardiac Doppler and Color Doppler (Both Spectral and Color            Flow Doppler were utilized during procedure). Indications:    Elevated Troponin  History:        Patient has prior history of Echocardiogram examinations, most                 recent 09/20/2021. COPD, Signs/Symptoms:Shortness of Breath; Risk                 Factors:Hypertension, Diabetes, Current Smoker and Dyslipidemia.  Sonographer:    Lucendia Herrlich RCS Referring Phys: [id number] JUSTIN B HOWERTER IMPRESSIONS  1. Left ventricular ejection fraction, by estimation, is 65 to 70%. The left ventricle has normal function. The left ventricle has no regional wall motion abnormalities. There is mild concentric left ventricular hypertrophy. Left ventricular diastolic parameters are consistent with Grade I diastolic dysfunction (impaired relaxation).  2. Right ventricular systolic  function is normal. The right ventricular size is normal.  3. The mitral valve is normal in structure. No evidence of mitral valve regurgitation. No evidence of mitral stenosis.  4. The aortic valve is normal in structure. Aortic valve regurgitation is not visualized. No aortic stenosis is present.  5. The inferior vena cava is normal in size with greater than 50% respiratory variability, suggesting right atrial pressure of 3 mmHg. FINDINGS  Left Ventricle: Left ventricular ejection fraction, by estimation, is 65 to 70%. The left ventricle has normal function. The left ventricle has no regional wall motion abnormalities. Strain imaging was not performed. The left ventricular internal cavity  size was normal in size. There is mild concentric left ventricular hypertrophy. Left ventricular diastolic parameters are consistent with Grade I diastolic dysfunction (impaired relaxation). Right Ventricle: The right ventricular size is normal. No increase in right ventricular wall thickness. Right ventricular systolic function is normal. Left Atrium: Left atrial size was normal in size. Right Atrium: Right atrial size was normal in size. Pericardium: There is no evidence of pericardial effusion. Mitral Valve: The mitral valve is normal in structure. No evidence of mitral valve regurgitation. No evidence of mitral valve stenosis. Tricuspid Valve: The tricuspid valve is normal in structure. Tricuspid valve regurgitation is trivial. No evidence of tricuspid stenosis. Aortic Valve: The aortic valve is normal in structure. Aortic valve regurgitation is not visualized. No aortic stenosis is present. Aortic valve peak gradient measures 11.0 mmHg. Pulmonic Valve: The pulmonic valve was normal in structure. Pulmonic valve regurgitation is trivial. No evidence of pulmonic stenosis. Aorta: The aortic root is normal in size and structure. Venous: The inferior vena cava is normal in size with greater than 50% respiratory variability,  suggesting right atrial pressure of 3 mmHg. IAS/Shunts: No atrial level shunt detected by color flow Doppler. Additional Comments: 3D imaging was not performed.  LEFT VENTRICLE PLAX 2D LVIDd:         5.20 cm   Diastology LVIDs:         3.00 cm   LV e' medial:    6.42 cm/s LV PW:         1.10 cm  LV E/e' medial:  12.2 LV IVS:        1.00 cm   LV e' lateral:   7.62 cm/s LVOT diam:     2.20 cm   LV E/e' lateral: 10.3 LV SV:         67 LV SV Index:   29 LVOT Area:     3.80 cm  RIGHT VENTRICLE             IVC RV S prime:     17.40 cm/s  IVC diam: 1.90 cm TAPSE (M-mode): 2.5 cm LEFT ATRIUM             Index        RIGHT ATRIUM           Index LA diam:        3.50 cm 1.51 cm/m   RA Area:     21.60 cm LA Vol (A2C):   51.7 ml 22.36 ml/m  RA Volume:   61.10 ml  26.42 ml/m LA Vol (A4C):   50.5 ml 21.84 ml/m LA Biplane Vol: 53.3 ml 23.05 ml/m  AORTIC VALVE AV Area (Vmax): 2.61 cm AV Vmax:        166.00 cm/s AV Peak Grad:   11.0 mmHg LVOT Vmax:      114.00 cm/s LVOT Vmean:     74.500 cm/s LVOT VTI:       0.176 m  AORTA Ao Root diam: 3.70 cm Ao Asc diam:  3.60 cm MITRAL VALVE MV Area (PHT): 5.54 cm     SHUNTS MV Decel Time: 137 msec     Systemic VTI:  0.18 m MV E velocity: 78.40 cm/s   Systemic Diam: 2.20 cm MV A velocity: 111.00 cm/s MV E/A ratio:  0.71 Arvilla Meres MD Electronically signed by Arvilla Meres MD Signature Date/Time: 07/03/2023/5:40:02 PM    Final    CT Angio Chest PE W/Cm &/Or Wo Cm Result Date: 07/02/2023 CLINICAL DATA:  Pulmonary embolism (PE) suspected, high prob. Shortness of breath EXAM: CT ANGIOGRAPHY CHEST WITH CONTRAST TECHNIQUE: Multidetector CT imaging of the chest was performed using the standard protocol during bolus administration of intravenous contrast. Multiplanar CT image reconstructions and MIPs were obtained to evaluate the vascular anatomy. RADIATION DOSE REDUCTION: This exam was performed according to the departmental dose-optimization program which includes automated  exposure control, adjustment of the mA and/or kV according to patient size and/or use of iterative reconstruction technique. CONTRAST:  75mL OMNIPAQUE IOHEXOL 350 MG/ML SOLN COMPARISON:  06/15/2023 FINDINGS: Cardiovascular: No filling defects in the pulmonary arteries to suggest pulmonary emboli. Heart is normal size. Aorta is normal caliber. Mediastinum/Nodes: Abnormal soft tissue at the thoracic inlet and extending into the upper mediastinum surrounding the trachea and esophagus corresponding to known laryngeal cancer. This narrows the trachea at the level of the thoracic inlet. Findings are similar to prior study. No adenopathy. Lungs/Pleura: Lungs are clear. No focal airspace opacities or suspicious nodules. No effusions. Upper Abdomen: No acute findings Musculoskeletal: Right upper chest wall Port-A-Cath remains in place. No acute bony abnormality. Review of the MIP images confirms the above findings. IMPRESSION: No evidence of pulmonary embolus. Abnormal soft tissue at the thoracic inlet and upper mediastinum surrounding the trachea and esophagus compatible with patient's known cancer. This narrows the tracheal lumen significantly at the level of the thoracic inlet. Electronically Signed   By: Charlett Nose M.D.   On: 07/02/2023 21:06   DG Chest Port 1 View Result Date: 07/02/2023 CLINICAL DATA:  Shortness of breath EXAM: PORTABLE CHEST 1 VIEW COMPARISON:  06/15/2023 FINDINGS: Lungs are clear.  No pleural effusion or pneumothorax. The heart is normal in size. Right chest power port terminates in the lower SVC. IMPRESSION: No acute cardiopulmonary disease. Electronically Signed   By: Charline Bills M.D.   On: 07/02/2023 20:10    Microbiology: Recent Results (from the past 240 hours)  Resp panel by RT-PCR (RSV, Flu A&B, Covid) Anterior Nasal Swab     Status: None   Collection Time: 07/29/23  8:12 AM   Specimen: Anterior Nasal Swab  Result Value Ref Range Status   SARS Coronavirus 2 by RT PCR  NEGATIVE NEGATIVE Final    Comment: (NOTE) SARS-CoV-2 target nucleic acids are NOT DETECTED.  The SARS-CoV-2 RNA is generally detectable in upper respiratory specimens during the acute phase of infection. The lowest concentration of SARS-CoV-2 viral copies this assay can detect is 138 copies/mL. A negative result does not preclude SARS-Cov-2 infection and should not be used as the sole basis for treatment or other patient management decisions. A negative result may occur with  improper specimen collection/handling, submission of specimen other than nasopharyngeal swab, presence of viral mutation(s) within the areas targeted by this assay, and inadequate number of viral copies(<138 copies/mL). A negative result must be combined with clinical observations, patient history, and epidemiological information. The expected result is Negative.  Fact Sheet for Patients:  BloggerCourse.com  Fact Sheet for Healthcare Providers:  SeriousBroker.it  This test is no t yet approved or cleared by the [place] FDA and  has been authorized for detection and/or diagnosis of SARS-CoV-2 by FDA under an Emergency Use Authorization (EUA). This EUA will remain  in effect (meaning this test can be used) for the duration of the COVID-19 declaration under Section 564(b)(1) of the Act, 21 U.S.C.section 360bbb-3(b)(1), unless the authorization is terminated  or revoked sooner.       Influenza A by PCR NEGATIVE NEGATIVE Final   Influenza B by PCR NEGATIVE NEGATIVE Final    Comment: (NOTE) The Xpert Xpress SARS-CoV-2/FLU/RSV plus assay is intended as an aid in the diagnosis of influenza from Nasopharyngeal swab specimens and should not be used as a sole basis for treatment. Nasal washings and aspirates are unacceptable for Xpert Xpress SARS-CoV-2/FLU/RSV testing.  Fact Sheet for Patients: BloggerCourse.com  Fact Sheet for  Healthcare Providers: SeriousBroker.it  This test is not yet approved or cleared by the [place] FDA and has been authorized for detection and/or diagnosis of SARS-CoV-2 by FDA under an Emergency Use Authorization (EUA). This EUA will remain in effect (meaning this test can be used) for the duration of the COVID-19 declaration under Section 564(b)(1) of the Act, 21 U.S.C. section 360bbb-3(b)(1), unless the authorization is terminated or revoked.     Resp Syncytial Virus by PCR NEGATIVE NEGATIVE Final    Comment: (NOTE) Fact Sheet for Patients: BloggerCourse.com  Fact Sheet for Healthcare Providers: SeriousBroker.it  This test is not yet approved or cleared by the [place] FDA and has been authorized for detection and/or diagnosis of SARS-CoV-2 by FDA under an Emergency Use Authorization (EUA). This EUA will remain in effect (meaning this test can be used) for the duration of the COVID-19 declaration under Section 564(b)(1) of the Act, 21 U.S.C. section 360bbb-3(b)(1), unless the authorization is terminated or revoked.  Performed at [facility], [address], [place], [place] [address]      Labs: Basic Metabolic Panel: Recent Labs  Lab 07/25/23  1155 07/29/23 0628 07/30/23 0353 07/31/23 0239  NA 129* 128* 132* 136  K 4.8 4.9 4.8 4.5  CL 86* 83* 89* 91*  CO2 37* 34* 33* 33*  GLUCOSE 352* 505* 263* 122*  BUN 27* 28* 32* 45*  CREATININE 0.84 0.91 0.97 1.14  CALCIUM 10.0 9.4 9.4 9.7  MG  --   --  2.5*  --    Liver Function Tests: Recent Labs  Lab 07/25/23 1155 07/30/23 0353  AST 9* 13*  ALT 11 16  ALKPHOS 99 93  BILITOT 0.4 0.5  PROT 7.9 7.5  ALBUMIN 3.7 2.5*   No results for input(s): "LIPASE", "AMYLASE" in the last 168 hours. No results for input(s): "AMMONIA" in the last 168 hours. CBC: Recent Labs  Lab 07/25/23 1155 07/29/23 0628 07/29/23 1930  07/30/23 0353 07/31/23 0239  WBC 12.3* 15.5*  --  11.7* 11.6*  NEUTROABS 9.1* 12.1*  --   --   --   HGB 8.4* 5.8* 9.0* 9.0* 9.9*  HCT 27.3* 19.5* 28.9* 28.8* 32.3*  MCV 79.4* 80.9  --  82.3 82.2  PLT 514* 706*  --  567* 643*   Cardiac Enzymes: No results for input(s): "CKTOTAL", "CKMB", "CKMBINDEX", "TROPONINI" in the last 168 hours. BNP: BNP (last 3 results) Recent Labs    07/02/23 1847 07/14/23 2322 07/29/23 0628  BNP 27.6 108.6* 80.9    ProBNP (last 3 results) No results for input(s): "PROBNP" in the last 8760 hours.  CBG: Recent Labs  Lab 07/31/23 0734 07/31/23 1152 07/31/23 1210 07/31/23 1310 07/31/23 1514  GLUCAP 176* 443* 363* 319* 99       Signed:  Silvano Bilis MD.  [facility] 07/31/2023, 4:00 PM

## 2023-07-31 NOTE — Progress Notes (Signed)
 I have reviewed and concur with this student's documentation.   Floyed Masoud Futures trader, RN 07/31/2023 5:16 PM

## 2023-07-31 NOTE — Discharge Instructions (Signed)
 Check a fasting sugar every morning and adjust insulin 1 unit at a time to achieve a goal fasting sugar of 140-180

## 2023-07-31 NOTE — Progress Notes (Signed)
 Dr. Ashok Pall, primary RN, and clinical instructor notifed of elevated glucose level of 443 at 1152. Repeat BG at 1210. Ordered to proceed with scheduled insulin per sliding scale (20U). Recheck 1 hr later still elevated AT 1310, MD ordered one-time dose of 10 units. Adminstered per order. Patient is sitting upright in chair next to bedside. Chair is locked and call light is within reach.

## 2023-08-01 ENCOUNTER — Telehealth: Payer: Self-pay

## 2023-08-01 ENCOUNTER — Encounter: Payer: Self-pay | Admitting: Hematology

## 2023-08-01 ENCOUNTER — Other Ambulatory Visit: Payer: Self-pay

## 2023-08-01 ENCOUNTER — Ambulatory Visit

## 2023-08-01 ENCOUNTER — Ambulatory Visit
Admission: RE | Admit: 2023-08-01 | Discharge: 2023-08-01 | Disposition: A | Discharge status: Home / Self Care | Source: Ambulatory Visit | Attending: Radiation Oncology | Admitting: Radiation Oncology

## 2023-08-01 DIAGNOSIS — Z510 Encounter for antineoplastic radiation therapy: Secondary | ICD-10-CM | POA: Diagnosis not present

## 2023-08-01 DIAGNOSIS — C4492 Squamous cell carcinoma of skin, unspecified: Secondary | ICD-10-CM | POA: Diagnosis not present

## 2023-08-01 DIAGNOSIS — C771 Secondary and unspecified malignant neoplasm of intrathoracic lymph nodes: Secondary | ICD-10-CM | POA: Diagnosis not present

## 2023-08-01 DIAGNOSIS — C320 Malignant neoplasm of glottis: Secondary | ICD-10-CM | POA: Diagnosis not present

## 2023-08-01 LAB — RAD ONC ARIA SESSION SUMMARY
Course Elapsed Days: 0
Course Elapsed Days: 0
Plan Fractions Treated to Date: 1
Plan Fractions Treated to Date: 2
Plan Prescribed Dose Per Fraction: 3.7 Gy
Plan Prescribed Dose Per Fraction: 3.7 Gy
Plan Total Fractions Prescribed: 4
Plan Total Fractions Prescribed: 4
Plan Total Prescribed Dose: 14.8 Gy
Plan Total Prescribed Dose: 14.8 Gy
Reference Point Dosage Given to Date: 3.7 Gy
Reference Point Dosage Given to Date: 7.4 Gy
Reference Point Session Dosage Given: 3.7 Gy
Reference Point Session Dosage Given: 3.7 Gy
Session Number: 1
Session Number: 2

## 2023-08-01 NOTE — Telephone Encounter (Signed)
 Notified the pt that his form was ready and will leave at the front desk as requested.

## 2023-08-02 ENCOUNTER — Other Ambulatory Visit: Payer: Self-pay

## 2023-08-02 ENCOUNTER — Ambulatory Visit
Admission: RE | Admit: 2023-08-02 | Discharge: 2023-08-02 | Disposition: A | Discharge status: Home / Self Care | Source: Ambulatory Visit | Attending: Radiation Oncology | Admitting: Radiation Oncology

## 2023-08-02 ENCOUNTER — Encounter: Payer: Self-pay | Admitting: Radiation Oncology

## 2023-08-02 ENCOUNTER — Ambulatory Visit
Admission: RE | Admit: 2023-08-02 | Discharge: 2023-08-02 | Disposition: A | Discharge status: Home / Self Care | Payer: Self-pay | Source: Ambulatory Visit | Attending: Radiation Oncology | Admitting: Radiation Oncology

## 2023-08-02 DIAGNOSIS — R001 Bradycardia, unspecified: Secondary | ICD-10-CM | POA: Diagnosis not present

## 2023-08-02 DIAGNOSIS — Z9221 Personal history of antineoplastic chemotherapy: Secondary | ICD-10-CM | POA: Diagnosis not present

## 2023-08-02 DIAGNOSIS — R093 Abnormal sputum: Secondary | ICD-10-CM | POA: Diagnosis not present

## 2023-08-02 DIAGNOSIS — R0689 Other abnormalities of breathing: Secondary | ICD-10-CM | POA: Diagnosis not present

## 2023-08-02 DIAGNOSIS — C771 Secondary and unspecified malignant neoplasm of intrathoracic lymph nodes: Secondary | ICD-10-CM | POA: Diagnosis not present

## 2023-08-02 DIAGNOSIS — Z515 Encounter for palliative care: Secondary | ICD-10-CM | POA: Diagnosis not present

## 2023-08-02 DIAGNOSIS — Z930 Tracheostomy status: Secondary | ICD-10-CM | POA: Diagnosis not present

## 2023-08-02 DIAGNOSIS — C329 Malignant neoplasm of larynx, unspecified: Secondary | ICD-10-CM | POA: Diagnosis not present

## 2023-08-02 DIAGNOSIS — C781 Secondary malignant neoplasm of mediastinum: Secondary | ICD-10-CM | POA: Diagnosis not present

## 2023-08-02 DIAGNOSIS — C4442 Squamous cell carcinoma of skin of scalp and neck: Secondary | ICD-10-CM | POA: Diagnosis not present

## 2023-08-02 DIAGNOSIS — D6489 Other specified anemias: Secondary | ICD-10-CM | POA: Diagnosis not present

## 2023-08-02 DIAGNOSIS — R1310 Dysphagia, unspecified: Secondary | ICD-10-CM | POA: Diagnosis not present

## 2023-08-02 DIAGNOSIS — C8510 Unspecified B-cell lymphoma, unspecified site: Secondary | ICD-10-CM | POA: Diagnosis not present

## 2023-08-02 DIAGNOSIS — R739 Hyperglycemia, unspecified: Secondary | ICD-10-CM | POA: Diagnosis not present

## 2023-08-02 DIAGNOSIS — R1312 Dysphagia, oropharyngeal phase: Secondary | ICD-10-CM | POA: Diagnosis not present

## 2023-08-02 DIAGNOSIS — C760 Malignant neoplasm of head, face and neck: Secondary | ICD-10-CM

## 2023-08-02 DIAGNOSIS — R0600 Dyspnea, unspecified: Secondary | ICD-10-CM | POA: Diagnosis not present

## 2023-08-02 DIAGNOSIS — R519 Headache, unspecified: Secondary | ICD-10-CM | POA: Diagnosis not present

## 2023-08-02 DIAGNOSIS — C779 Secondary and unspecified malignant neoplasm of lymph node, unspecified: Secondary | ICD-10-CM | POA: Diagnosis not present

## 2023-08-02 DIAGNOSIS — Z510 Encounter for antineoplastic radiation therapy: Secondary | ICD-10-CM | POA: Diagnosis not present

## 2023-08-02 DIAGNOSIS — J398 Other specified diseases of upper respiratory tract: Secondary | ICD-10-CM | POA: Diagnosis not present

## 2023-08-02 DIAGNOSIS — R590 Localized enlarged lymph nodes: Secondary | ICD-10-CM | POA: Diagnosis not present

## 2023-08-02 DIAGNOSIS — C4492 Squamous cell carcinoma of skin, unspecified: Secondary | ICD-10-CM | POA: Diagnosis not present

## 2023-08-02 DIAGNOSIS — R000 Tachycardia, unspecified: Secondary | ICD-10-CM | POA: Diagnosis not present

## 2023-08-02 DIAGNOSIS — C321 Malignant neoplasm of supraglottis: Secondary | ICD-10-CM | POA: Diagnosis not present

## 2023-08-02 DIAGNOSIS — D75839 Thrombocytosis, unspecified: Secondary | ICD-10-CM | POA: Diagnosis not present

## 2023-08-02 DIAGNOSIS — J9859 Other diseases of mediastinum, not elsewhere classified: Secondary | ICD-10-CM | POA: Diagnosis not present

## 2023-08-02 DIAGNOSIS — M47812 Spondylosis without myelopathy or radiculopathy, cervical region: Secondary | ICD-10-CM | POA: Diagnosis not present

## 2023-08-02 DIAGNOSIS — E042 Nontoxic multinodular goiter: Secondary | ICD-10-CM | POA: Diagnosis not present

## 2023-08-02 DIAGNOSIS — J449 Chronic obstructive pulmonary disease, unspecified: Secondary | ICD-10-CM | POA: Diagnosis not present

## 2023-08-02 DIAGNOSIS — D649 Anemia, unspecified: Secondary | ICD-10-CM | POA: Diagnosis not present

## 2023-08-02 DIAGNOSIS — Z9911 Dependence on respirator [ventilator] status: Secondary | ICD-10-CM | POA: Diagnosis not present

## 2023-08-02 DIAGNOSIS — J9601 Acute respiratory failure with hypoxia: Secondary | ICD-10-CM | POA: Diagnosis not present

## 2023-08-02 DIAGNOSIS — D630 Anemia in neoplastic disease: Secondary | ICD-10-CM | POA: Diagnosis not present

## 2023-08-02 DIAGNOSIS — R0902 Hypoxemia: Secondary | ICD-10-CM | POA: Diagnosis not present

## 2023-08-02 DIAGNOSIS — T17920A Food in respiratory tract, part unspecified causing asphyxiation, initial encounter: Secondary | ICD-10-CM | POA: Diagnosis not present

## 2023-08-02 DIAGNOSIS — J988 Other specified respiratory disorders: Secondary | ICD-10-CM | POA: Diagnosis not present

## 2023-08-02 DIAGNOSIS — Z4682 Encounter for fitting and adjustment of non-vascular catheter: Secondary | ICD-10-CM | POA: Diagnosis not present

## 2023-08-02 DIAGNOSIS — E119 Type 2 diabetes mellitus without complications: Secondary | ICD-10-CM | POA: Diagnosis not present

## 2023-08-02 DIAGNOSIS — J189 Pneumonia, unspecified organism: Secondary | ICD-10-CM | POA: Diagnosis not present

## 2023-08-02 DIAGNOSIS — R509 Fever, unspecified: Secondary | ICD-10-CM | POA: Diagnosis not present

## 2023-08-02 DIAGNOSIS — M542 Cervicalgia: Secondary | ICD-10-CM | POA: Diagnosis not present

## 2023-08-02 DIAGNOSIS — E079 Disorder of thyroid, unspecified: Secondary | ICD-10-CM | POA: Diagnosis not present

## 2023-08-02 DIAGNOSIS — G893 Neoplasm related pain (acute) (chronic): Secondary | ICD-10-CM | POA: Diagnosis not present

## 2023-08-02 DIAGNOSIS — R591 Generalized enlarged lymph nodes: Secondary | ICD-10-CM | POA: Diagnosis not present

## 2023-08-02 DIAGNOSIS — I4519 Other right bundle-branch block: Secondary | ICD-10-CM | POA: Diagnosis not present

## 2023-08-02 DIAGNOSIS — C320 Malignant neoplasm of glottis: Secondary | ICD-10-CM | POA: Diagnosis not present

## 2023-08-02 DIAGNOSIS — J9811 Atelectasis: Secondary | ICD-10-CM | POA: Diagnosis not present

## 2023-08-02 DIAGNOSIS — I959 Hypotension, unspecified: Secondary | ICD-10-CM | POA: Diagnosis not present

## 2023-08-02 DIAGNOSIS — R918 Other nonspecific abnormal finding of lung field: Secondary | ICD-10-CM | POA: Diagnosis not present

## 2023-08-02 DIAGNOSIS — J989 Respiratory disorder, unspecified: Secondary | ICD-10-CM | POA: Diagnosis not present

## 2023-08-02 LAB — RAD ONC ARIA SESSION SUMMARY
Course Elapsed Days: 1
Course Elapsed Days: 1
Plan Fractions Treated to Date: 3
Plan Fractions Treated to Date: 4
Plan Prescribed Dose Per Fraction: 3.7 Gy
Plan Prescribed Dose Per Fraction: 3.7 Gy
Plan Total Fractions Prescribed: 4
Plan Total Fractions Prescribed: 4
Plan Total Prescribed Dose: 14.8 Gy
Plan Total Prescribed Dose: 14.8 Gy
Reference Point Dosage Given to Date: 11.1 Gy
Reference Point Dosage Given to Date: 14.8 Gy
Reference Point Session Dosage Given: 3.7 Gy
Reference Point Session Dosage Given: 3.7 Gy
Session Number: 3
Session Number: 4

## 2023-08-02 NOTE — Progress Notes (Addendum)
 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  A large paratracheal mass is compressing his airway and growing despite immunotherapy and previous radiation.  He completes a palliative QUAD SHOT course of re irradiation today, and I plan to repeat this regimen for durable control, tentatively, in 1 mo.  Due to the possibility of airway compromise in the future, I am referring him to Interventional Pulmonology at [facility] for consideration of tracheal stenting or other procedures as appropriate.   Here is a photo of his trachea during radiation treatment - the mass grew significantly in the 2 week interval between diagnostic imaging and initiation of radiation:    Cc: Dr. Waldon Merl   -----------------------------------  Lonie Peak, MD Radiation Oncology, [facility]

## 2023-08-03 DIAGNOSIS — J441 Chronic obstructive pulmonary disease with (acute) exacerbation: Secondary | ICD-10-CM | POA: Diagnosis not present

## 2023-08-03 DIAGNOSIS — R278 Other lack of coordination: Secondary | ICD-10-CM | POA: Diagnosis not present

## 2023-08-03 DIAGNOSIS — R070 Pain in throat: Secondary | ICD-10-CM | POA: Diagnosis not present

## 2023-08-03 DIAGNOSIS — R001 Bradycardia, unspecified: Secondary | ICD-10-CM | POA: Diagnosis not present

## 2023-08-03 DIAGNOSIS — D649 Anemia, unspecified: Secondary | ICD-10-CM | POA: Diagnosis not present

## 2023-08-03 DIAGNOSIS — C159 Malignant neoplasm of esophagus, unspecified: Secondary | ICD-10-CM | POA: Diagnosis not present

## 2023-08-03 DIAGNOSIS — K5900 Constipation, unspecified: Secondary | ICD-10-CM | POA: Diagnosis not present

## 2023-08-03 DIAGNOSIS — G4700 Insomnia, unspecified: Secondary | ICD-10-CM | POA: Diagnosis not present

## 2023-08-03 DIAGNOSIS — E1165 Type 2 diabetes mellitus with hyperglycemia: Secondary | ICD-10-CM | POA: Diagnosis not present

## 2023-08-03 DIAGNOSIS — E873 Alkalosis: Secondary | ICD-10-CM | POA: Diagnosis not present

## 2023-08-03 DIAGNOSIS — G9341 Metabolic encephalopathy: Secondary | ICD-10-CM | POA: Diagnosis not present

## 2023-08-03 DIAGNOSIS — Z931 Gastrostomy status: Secondary | ICD-10-CM | POA: Diagnosis not present

## 2023-08-03 DIAGNOSIS — Z930 Tracheostomy status: Secondary | ICD-10-CM | POA: Diagnosis not present

## 2023-08-03 DIAGNOSIS — R1310 Dysphagia, unspecified: Secondary | ICD-10-CM | POA: Diagnosis not present

## 2023-08-03 DIAGNOSIS — R059 Cough, unspecified: Secondary | ICD-10-CM | POA: Diagnosis not present

## 2023-08-03 DIAGNOSIS — L909 Atrophic disorder of skin, unspecified: Secondary | ICD-10-CM | POA: Diagnosis not present

## 2023-08-03 DIAGNOSIS — C760 Malignant neoplasm of head, face and neck: Secondary | ICD-10-CM | POA: Diagnosis not present

## 2023-08-03 DIAGNOSIS — K219 Gastro-esophageal reflux disease without esophagitis: Secondary | ICD-10-CM | POA: Diagnosis not present

## 2023-08-03 DIAGNOSIS — C329 Malignant neoplasm of larynx, unspecified: Secondary | ICD-10-CM | POA: Diagnosis not present

## 2023-08-03 DIAGNOSIS — I4519 Other right bundle-branch block: Secondary | ICD-10-CM | POA: Diagnosis not present

## 2023-08-03 DIAGNOSIS — F411 Generalized anxiety disorder: Secondary | ICD-10-CM | POA: Diagnosis not present

## 2023-08-03 DIAGNOSIS — Z430 Encounter for attention to tracheostomy: Secondary | ICD-10-CM | POA: Diagnosis not present

## 2023-08-03 DIAGNOSIS — E440 Moderate protein-calorie malnutrition: Secondary | ICD-10-CM | POA: Diagnosis not present

## 2023-08-03 DIAGNOSIS — E43 Unspecified severe protein-calorie malnutrition: Secondary | ICD-10-CM | POA: Diagnosis not present

## 2023-08-03 DIAGNOSIS — Z515 Encounter for palliative care: Secondary | ICD-10-CM | POA: Diagnosis not present

## 2023-08-03 DIAGNOSIS — J189 Pneumonia, unspecified organism: Secondary | ICD-10-CM | POA: Diagnosis not present

## 2023-08-03 DIAGNOSIS — G893 Neoplasm related pain (acute) (chronic): Secondary | ICD-10-CM | POA: Diagnosis not present

## 2023-08-03 DIAGNOSIS — E119 Type 2 diabetes mellitus without complications: Secondary | ICD-10-CM | POA: Diagnosis not present

## 2023-08-03 DIAGNOSIS — J9601 Acute respiratory failure with hypoxia: Secondary | ICD-10-CM | POA: Diagnosis not present

## 2023-08-03 DIAGNOSIS — N179 Acute kidney failure, unspecified: Secondary | ICD-10-CM | POA: Diagnosis not present

## 2023-08-03 DIAGNOSIS — J440 Chronic obstructive pulmonary disease with acute lower respiratory infection: Secondary | ICD-10-CM | POA: Diagnosis not present

## 2023-08-03 DIAGNOSIS — S6422XS Injury of radial nerve at wrist and hand level of left arm, sequela: Secondary | ICD-10-CM | POA: Diagnosis not present

## 2023-08-03 DIAGNOSIS — J449 Chronic obstructive pulmonary disease, unspecified: Secondary | ICD-10-CM | POA: Diagnosis not present

## 2023-08-03 DIAGNOSIS — Z1152 Encounter for screening for COVID-19: Secondary | ICD-10-CM | POA: Diagnosis not present

## 2023-08-03 DIAGNOSIS — I10 Essential (primary) hypertension: Secondary | ICD-10-CM | POA: Diagnosis not present

## 2023-08-03 DIAGNOSIS — L299 Pruritus, unspecified: Secondary | ICD-10-CM | POA: Diagnosis not present

## 2023-08-03 DIAGNOSIS — K5903 Drug induced constipation: Secondary | ICD-10-CM | POA: Diagnosis not present

## 2023-08-03 DIAGNOSIS — C781 Secondary malignant neoplasm of mediastinum: Secondary | ICD-10-CM | POA: Diagnosis not present

## 2023-08-03 DIAGNOSIS — D509 Iron deficiency anemia, unspecified: Secondary | ICD-10-CM | POA: Diagnosis not present

## 2023-08-03 DIAGNOSIS — R262 Difficulty in walking, not elsewhere classified: Secondary | ICD-10-CM | POA: Diagnosis not present

## 2023-08-03 DIAGNOSIS — D638 Anemia in other chronic diseases classified elsewhere: Secondary | ICD-10-CM | POA: Diagnosis not present

## 2023-08-03 DIAGNOSIS — C7839 Secondary malignant neoplasm of other respiratory organs: Secondary | ICD-10-CM | POA: Diagnosis not present

## 2023-08-03 DIAGNOSIS — F419 Anxiety disorder, unspecified: Secondary | ICD-10-CM | POA: Diagnosis not present

## 2023-08-03 DIAGNOSIS — R000 Tachycardia, unspecified: Secondary | ICD-10-CM | POA: Diagnosis not present

## 2023-08-03 DIAGNOSIS — I4729 Other ventricular tachycardia: Secondary | ICD-10-CM | POA: Diagnosis not present

## 2023-08-03 DIAGNOSIS — L89159 Pressure ulcer of sacral region, unspecified stage: Secondary | ICD-10-CM | POA: Diagnosis not present

## 2023-08-03 DIAGNOSIS — K9423 Gastrostomy malfunction: Secondary | ICD-10-CM | POA: Diagnosis not present

## 2023-08-03 DIAGNOSIS — J398 Other specified diseases of upper respiratory tract: Secondary | ICD-10-CM | POA: Diagnosis not present

## 2023-08-03 DIAGNOSIS — Z431 Encounter for attention to gastrostomy: Secondary | ICD-10-CM | POA: Diagnosis not present

## 2023-08-03 DIAGNOSIS — K117 Disturbances of salivary secretion: Secondary | ICD-10-CM | POA: Diagnosis not present

## 2023-08-03 DIAGNOSIS — G5632 Lesion of radial nerve, left upper limb: Secondary | ICD-10-CM | POA: Diagnosis not present

## 2023-08-03 DIAGNOSIS — I952 Hypotension due to drugs: Secondary | ICD-10-CM | POA: Diagnosis not present

## 2023-08-03 DIAGNOSIS — I4510 Unspecified right bundle-branch block: Secondary | ICD-10-CM | POA: Diagnosis not present

## 2023-08-03 NOTE — Radiation Completion Notes (Signed)
 Patient Name: Jesus [name], Jesus [name] MRN: [id number] Date of Birth: 1956-02-08 Referring Physician: Johny Blamer, M.D. Date of Service: 2023-08-03 Radiation Oncologist: Lonie Peak, M.D. 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 Staging on 2020-10-02: Laryngeal cancer (HCC) T=cT1a, N=cN0, M=cM0 Intent: Palliative     ==========DELIVERED PLANS==========  First Treatment Date: 2023-08-01 Last Treatment Date: 2023-08-02   Plan Name: Chest_UpperLN Site: Thorax Technique: IMRT Mode: Photon Dose Per Fraction: 3.7 Gy Prescribed Dose (Delivered / Prescribed): 14.8 Gy / 14.8 Gy Prescribed Fxs (Delivered / Prescribed): 4 / 4     ==========ON TREATMENT VISIT DATES========== 2023-08-02     ==========UPCOMING VISITS========== 09/26/2023 [facility] INFUSION 1HR30MIN (90) [facility] INFUSION  09/26/2023 [facility] EST PT 30 Johney Maine, MD  09/26/2023 [facility] PORT FLUSH W/LAB [facility] FLUSH  09/05/2023 [facility] FINAL TREATMENT [facility] [id number]  09/05/2023 [facility] IMRT TREATMENT [facility] [id number]  09/05/2023 [facility] PORT FLUSH W/LAB [facility] FLUSH  09/05/2023 [facility] INFUSION 1HR30MIN (90) [facility] INFUSION  09/04/2023 [facility] IMRT TREATMENT [facility] [id number]  09/04/2023 [facility] NEW START Lonie Peak, MD  09/01/2023 [facility] FOLLOW UP 20 Lonie Peak, MD  08/15/2023 [facility] NUT 45 Noreene Larsson, RD  08/15/2023 [facility] INFUSION 1HR30MIN (90) [facility] INFUSION  08/15/2023 [facility] EST PT 30 Johney Maine, MD  08/15/2023 [facility] PORT FLUSH W/LAB [facility] FLUSH        ==========APPENDIX - ON TREATMENT VISIT  NOTES==========   See weekly On Treatment Notes in Epic for details in the Media tab (listed as Progress notes on the On Treatment Visit Dates listed above).

## 2023-08-07 ENCOUNTER — Ambulatory Visit: Admitting: Radiation Oncology

## 2023-08-07 ENCOUNTER — Inpatient Hospital Stay: Payer: PPO

## 2023-08-07 ENCOUNTER — Ambulatory Visit

## 2023-08-07 ENCOUNTER — Telehealth: Payer: Self-pay | Admitting: *Deleted

## 2023-08-07 NOTE — Telephone Encounter (Signed)
 Called patient to inform of CT for 08-08-23- arrival time 5:15 pm @ [facility] Radiology no restrictions to scan, spoke with patient's sister Newell Coral and she informed me that he cannot do this scan right now due to being in [facility] in ICU, he will call me when he is ready to do this scan

## 2023-08-08 ENCOUNTER — Ambulatory Visit

## 2023-08-08 ENCOUNTER — Other Ambulatory Visit (HOSPITAL_COMMUNITY)

## 2023-08-09 ENCOUNTER — Ambulatory Visit

## 2023-08-10 ENCOUNTER — Ambulatory Visit

## 2023-08-14 ENCOUNTER — Inpatient Hospital Stay
Admission: RE | Admit: 2023-08-14 | Discharge: 2023-08-14 | Disposition: A | Discharge status: Home / Self Care | Payer: Self-pay | Source: Ambulatory Visit | Attending: Radiation Oncology | Admitting: Radiation Oncology

## 2023-08-14 ENCOUNTER — Other Ambulatory Visit: Payer: Self-pay

## 2023-08-14 ENCOUNTER — Encounter: Payer: Self-pay | Admitting: Hematology

## 2023-08-14 DIAGNOSIS — C329 Malignant neoplasm of larynx, unspecified: Secondary | ICD-10-CM

## 2023-08-14 DIAGNOSIS — C4492 Squamous cell carcinoma of skin, unspecified: Secondary | ICD-10-CM

## 2023-08-15 ENCOUNTER — Inpatient Hospital Stay: Payer: PPO | Admitting: Hematology

## 2023-08-15 ENCOUNTER — Inpatient Hospital Stay: Admitting: Dietician

## 2023-08-15 ENCOUNTER — Inpatient Hospital Stay: Payer: PPO

## 2023-08-15 NOTE — Progress Notes (Signed)
 Planned to complete nutrition follow-up during infusion with patient. Chart reviewed. Patient currently hospitalized at [facility]. Will plan to see patient at next scheduled chemotherapy on 4/29.

## 2023-08-22 DIAGNOSIS — E43 Unspecified severe protein-calorie malnutrition: Secondary | ICD-10-CM | POA: Diagnosis not present

## 2023-08-22 DIAGNOSIS — C159 Malignant neoplasm of esophagus, unspecified: Secondary | ICD-10-CM | POA: Diagnosis not present

## 2023-08-22 DIAGNOSIS — R1310 Dysphagia, unspecified: Secondary | ICD-10-CM | POA: Diagnosis not present

## 2023-08-22 DIAGNOSIS — K9423 Gastrostomy malfunction: Secondary | ICD-10-CM | POA: Diagnosis not present

## 2023-08-25 NOTE — Progress Notes (Incomplete)
 [name] presents today in the clinic for a follow up appointment. He completed radiation therapy for Secondary and Unspecified Malignant Neoplasm of Intrathoracic Lymph Nodes on 08/02/2023. Patient identity verified x2.  Patient follow-up for a diagnosis of: Secondary and Unspecified Malignant Neoplasm of Intrathoracic Lymph Nodes   Location-  Intrathoracic Lymph Nodes  They completed their radiation on: 08/02/2023  Does the patient complain of any of the following: Chest pains/ SOB: *** Headaches/ Dizziness: *** Post radiation skin issues *** Joint pain/ Swelling: *** Motor function issues: *** Range of motion limitations: *** Fatigue post radiation: *** Appetite good/fair/poor: ***  Additional comments if applicable: ***

## 2023-08-28 ENCOUNTER — Inpatient Hospital Stay: Payer: PPO

## 2023-08-28 ENCOUNTER — Inpatient Hospital Stay: Payer: PPO | Admitting: Hematology

## 2023-08-31 ENCOUNTER — Ambulatory Visit (HOSPITAL_COMMUNITY)

## 2023-09-01 ENCOUNTER — Ambulatory Visit

## 2023-09-01 ENCOUNTER — Ambulatory Visit: Admitting: Radiation Oncology

## 2023-09-01 ENCOUNTER — Ambulatory Visit
Admission: RE | Admit: 2023-09-01 | Discharge: 2023-09-01 | Disposition: A | Discharge status: Home / Self Care | Source: Ambulatory Visit | Attending: Radiation Oncology | Admitting: Radiation Oncology

## 2023-09-01 DIAGNOSIS — C771 Secondary and unspecified malignant neoplasm of intrathoracic lymph nodes: Secondary | ICD-10-CM | POA: Insufficient documentation

## 2023-09-01 DIAGNOSIS — Z510 Encounter for antineoplastic radiation therapy: Secondary | ICD-10-CM | POA: Insufficient documentation

## 2023-09-01 DIAGNOSIS — C320 Malignant neoplasm of glottis: Secondary | ICD-10-CM | POA: Insufficient documentation

## 2023-09-02 DIAGNOSIS — R000 Tachycardia, unspecified: Secondary | ICD-10-CM | POA: Diagnosis not present

## 2023-09-02 DIAGNOSIS — J9601 Acute respiratory failure with hypoxia: Secondary | ICD-10-CM | POA: Diagnosis not present

## 2023-09-02 DIAGNOSIS — I4519 Other right bundle-branch block: Secondary | ICD-10-CM | POA: Diagnosis not present

## 2023-09-02 DIAGNOSIS — C329 Malignant neoplasm of larynx, unspecified: Secondary | ICD-10-CM | POA: Diagnosis not present

## 2023-09-02 DIAGNOSIS — Z930 Tracheostomy status: Secondary | ICD-10-CM | POA: Diagnosis not present

## 2023-09-03 DIAGNOSIS — D509 Iron deficiency anemia, unspecified: Secondary | ICD-10-CM | POA: Diagnosis not present

## 2023-09-03 DIAGNOSIS — C329 Malignant neoplasm of larynx, unspecified: Secondary | ICD-10-CM | POA: Diagnosis not present

## 2023-09-03 DIAGNOSIS — I4519 Other right bundle-branch block: Secondary | ICD-10-CM | POA: Diagnosis not present

## 2023-09-03 DIAGNOSIS — R000 Tachycardia, unspecified: Secondary | ICD-10-CM | POA: Diagnosis not present

## 2023-09-04 ENCOUNTER — Ambulatory Visit

## 2023-09-04 ENCOUNTER — Ambulatory Visit: Admitting: Radiation Oncology

## 2023-09-05 ENCOUNTER — Ambulatory Visit

## 2023-09-05 ENCOUNTER — Inpatient Hospital Stay: Payer: PPO | Attending: Hematology

## 2023-09-05 ENCOUNTER — Encounter: Admitting: Dietician

## 2023-09-05 ENCOUNTER — Inpatient Hospital Stay: Payer: PPO

## 2023-09-05 ENCOUNTER — Ambulatory Visit: Admitting: Radiation Oncology

## 2023-09-06 ENCOUNTER — Encounter: Payer: Self-pay | Admitting: Hematology

## 2023-09-06 ENCOUNTER — Ambulatory Visit

## 2023-09-14 DIAGNOSIS — G2581 Restless legs syndrome: Secondary | ICD-10-CM | POA: Diagnosis not present

## 2023-09-14 DIAGNOSIS — R0602 Shortness of breath: Secondary | ICD-10-CM | POA: Diagnosis not present

## 2023-09-14 DIAGNOSIS — R404 Transient alteration of awareness: Secondary | ICD-10-CM | POA: Diagnosis not present

## 2023-09-14 DIAGNOSIS — I2510 Atherosclerotic heart disease of native coronary artery without angina pectoris: Secondary | ICD-10-CM | POA: Diagnosis not present

## 2023-09-14 DIAGNOSIS — K117 Disturbances of salivary secretion: Secondary | ICD-10-CM | POA: Diagnosis not present

## 2023-09-14 DIAGNOSIS — I110 Hypertensive heart disease with heart failure: Secondary | ICD-10-CM | POA: Diagnosis not present

## 2023-09-14 DIAGNOSIS — Z1152 Encounter for screening for COVID-19: Secondary | ICD-10-CM | POA: Diagnosis not present

## 2023-09-14 DIAGNOSIS — R059 Cough, unspecified: Secondary | ICD-10-CM | POA: Diagnosis not present

## 2023-09-14 DIAGNOSIS — G8928 Other chronic postprocedural pain: Secondary | ICD-10-CM | POA: Diagnosis not present

## 2023-09-14 DIAGNOSIS — Z515 Encounter for palliative care: Secondary | ICD-10-CM | POA: Diagnosis not present

## 2023-09-14 DIAGNOSIS — C159 Malignant neoplasm of esophagus, unspecified: Secondary | ICD-10-CM | POA: Diagnosis not present

## 2023-09-14 DIAGNOSIS — G9341 Metabolic encephalopathy: Secondary | ICD-10-CM | POA: Diagnosis not present

## 2023-09-14 DIAGNOSIS — Z7189 Other specified counseling: Secondary | ICD-10-CM | POA: Diagnosis not present

## 2023-09-14 DIAGNOSIS — Y828 Other medical devices associated with adverse incidents: Secondary | ICD-10-CM | POA: Diagnosis not present

## 2023-09-14 DIAGNOSIS — J449 Chronic obstructive pulmonary disease, unspecified: Secondary | ICD-10-CM | POA: Diagnosis not present

## 2023-09-14 DIAGNOSIS — E43 Unspecified severe protein-calorie malnutrition: Secondary | ICD-10-CM | POA: Diagnosis not present

## 2023-09-14 DIAGNOSIS — I10 Essential (primary) hypertension: Secondary | ICD-10-CM | POA: Diagnosis not present

## 2023-09-14 DIAGNOSIS — J440 Chronic obstructive pulmonary disease with acute lower respiratory infection: Secondary | ICD-10-CM | POA: Diagnosis not present

## 2023-09-14 DIAGNOSIS — R278 Other lack of coordination: Secondary | ICD-10-CM | POA: Diagnosis not present

## 2023-09-14 DIAGNOSIS — J441 Chronic obstructive pulmonary disease with (acute) exacerbation: Secondary | ICD-10-CM | POA: Diagnosis not present

## 2023-09-14 DIAGNOSIS — F419 Anxiety disorder, unspecified: Secondary | ICD-10-CM | POA: Diagnosis not present

## 2023-09-14 DIAGNOSIS — J398 Other specified diseases of upper respiratory tract: Secondary | ICD-10-CM | POA: Diagnosis not present

## 2023-09-14 DIAGNOSIS — C8338 Diffuse large B-cell lymphoma, lymph nodes of multiple sites: Secondary | ICD-10-CM | POA: Diagnosis not present

## 2023-09-14 DIAGNOSIS — L909 Atrophic disorder of skin, unspecified: Secondary | ICD-10-CM | POA: Diagnosis not present

## 2023-09-14 DIAGNOSIS — R262 Difficulty in walking, not elsewhere classified: Secondary | ICD-10-CM | POA: Diagnosis not present

## 2023-09-14 DIAGNOSIS — I96 Gangrene, not elsewhere classified: Secondary | ICD-10-CM | POA: Diagnosis not present

## 2023-09-14 DIAGNOSIS — C4492 Squamous cell carcinoma of skin, unspecified: Secondary | ICD-10-CM | POA: Diagnosis not present

## 2023-09-14 DIAGNOSIS — R0603 Acute respiratory distress: Secondary | ICD-10-CM | POA: Diagnosis not present

## 2023-09-14 DIAGNOSIS — N200 Calculus of kidney: Secondary | ICD-10-CM | POA: Diagnosis not present

## 2023-09-14 DIAGNOSIS — E440 Moderate protein-calorie malnutrition: Secondary | ICD-10-CM | POA: Diagnosis not present

## 2023-09-14 DIAGNOSIS — F1120 Opioid dependence, uncomplicated: Secondary | ICD-10-CM | POA: Diagnosis not present

## 2023-09-14 DIAGNOSIS — K9423 Gastrostomy malfunction: Secondary | ICD-10-CM | POA: Diagnosis not present

## 2023-09-14 DIAGNOSIS — I468 Cardiac arrest due to other underlying condition: Secondary | ICD-10-CM | POA: Diagnosis not present

## 2023-09-14 DIAGNOSIS — J189 Pneumonia, unspecified organism: Secondary | ICD-10-CM | POA: Diagnosis not present

## 2023-09-14 DIAGNOSIS — N281 Cyst of kidney, acquired: Secondary | ICD-10-CM | POA: Diagnosis not present

## 2023-09-14 DIAGNOSIS — Z5111 Encounter for antineoplastic chemotherapy: Secondary | ICD-10-CM | POA: Diagnosis not present

## 2023-09-14 DIAGNOSIS — C329 Malignant neoplasm of larynx, unspecified: Secondary | ICD-10-CM | POA: Diagnosis not present

## 2023-09-14 DIAGNOSIS — F29 Unspecified psychosis not due to a substance or known physiological condition: Secondary | ICD-10-CM | POA: Diagnosis not present

## 2023-09-14 DIAGNOSIS — G893 Neoplasm related pain (acute) (chronic): Secondary | ICD-10-CM | POA: Diagnosis not present

## 2023-09-14 DIAGNOSIS — E119 Type 2 diabetes mellitus without complications: Secondary | ICD-10-CM | POA: Diagnosis not present

## 2023-09-14 DIAGNOSIS — C760 Malignant neoplasm of head, face and neck: Secondary | ICD-10-CM | POA: Diagnosis not present

## 2023-09-14 DIAGNOSIS — D630 Anemia in neoplastic disease: Secondary | ICD-10-CM | POA: Diagnosis not present

## 2023-09-14 DIAGNOSIS — J860 Pyothorax with fistula: Secondary | ICD-10-CM | POA: Diagnosis not present

## 2023-09-14 DIAGNOSIS — J9503 Malfunction of tracheostomy stoma: Secondary | ICD-10-CM | POA: Diagnosis not present

## 2023-09-14 DIAGNOSIS — K219 Gastro-esophageal reflux disease without esophagitis: Secondary | ICD-10-CM | POA: Diagnosis not present

## 2023-09-14 DIAGNOSIS — E785 Hyperlipidemia, unspecified: Secondary | ICD-10-CM | POA: Diagnosis not present

## 2023-09-14 DIAGNOSIS — Z430 Encounter for attention to tracheostomy: Secondary | ICD-10-CM | POA: Diagnosis not present

## 2023-09-14 DIAGNOSIS — C7951 Secondary malignant neoplasm of bone: Secondary | ICD-10-CM | POA: Diagnosis not present

## 2023-09-14 DIAGNOSIS — E1165 Type 2 diabetes mellitus with hyperglycemia: Secondary | ICD-10-CM | POA: Diagnosis not present

## 2023-09-14 DIAGNOSIS — K5900 Constipation, unspecified: Secondary | ICD-10-CM | POA: Diagnosis not present

## 2023-09-14 DIAGNOSIS — C771 Secondary and unspecified malignant neoplasm of intrathoracic lymph nodes: Secondary | ICD-10-CM | POA: Diagnosis not present

## 2023-09-14 DIAGNOSIS — Z431 Encounter for attention to gastrostomy: Secondary | ICD-10-CM | POA: Diagnosis not present

## 2023-09-14 DIAGNOSIS — C7839 Secondary malignant neoplasm of other respiratory organs: Secondary | ICD-10-CM | POA: Diagnosis not present

## 2023-09-14 DIAGNOSIS — R1310 Dysphagia, unspecified: Secondary | ICD-10-CM | POA: Diagnosis not present

## 2023-09-14 DIAGNOSIS — G894 Chronic pain syndrome: Secondary | ICD-10-CM | POA: Diagnosis not present

## 2023-09-14 DIAGNOSIS — J387 Other diseases of larynx: Secondary | ICD-10-CM | POA: Diagnosis not present

## 2023-09-14 DIAGNOSIS — E871 Hypo-osmolality and hyponatremia: Secondary | ICD-10-CM | POA: Diagnosis not present

## 2023-09-14 DIAGNOSIS — D638 Anemia in other chronic diseases classified elsewhere: Secondary | ICD-10-CM | POA: Diagnosis not present

## 2023-09-14 DIAGNOSIS — S6422XS Injury of radial nerve at wrist and hand level of left arm, sequela: Secondary | ICD-10-CM | POA: Diagnosis not present

## 2023-09-14 DIAGNOSIS — C781 Secondary malignant neoplasm of mediastinum: Secondary | ICD-10-CM | POA: Diagnosis not present

## 2023-09-14 DIAGNOSIS — G4700 Insomnia, unspecified: Secondary | ICD-10-CM | POA: Diagnosis not present

## 2023-09-14 DIAGNOSIS — J9601 Acute respiratory failure with hypoxia: Secondary | ICD-10-CM | POA: Diagnosis not present

## 2023-09-14 DIAGNOSIS — J9690 Respiratory failure, unspecified, unspecified whether with hypoxia or hypercapnia: Secondary | ICD-10-CM | POA: Diagnosis not present

## 2023-09-14 DIAGNOSIS — Z930 Tracheostomy status: Secondary | ICD-10-CM | POA: Diagnosis not present

## 2023-09-14 DIAGNOSIS — J9621 Acute and chronic respiratory failure with hypoxia: Secondary | ICD-10-CM | POA: Diagnosis not present

## 2023-09-14 DIAGNOSIS — R590 Localized enlarged lymph nodes: Secondary | ICD-10-CM | POA: Diagnosis not present

## 2023-09-14 DIAGNOSIS — R918 Other nonspecific abnormal finding of lung field: Secondary | ICD-10-CM | POA: Diagnosis not present

## 2023-09-14 DIAGNOSIS — D649 Anemia, unspecified: Secondary | ICD-10-CM | POA: Diagnosis not present

## 2023-09-14 DIAGNOSIS — F411 Generalized anxiety disorder: Secondary | ICD-10-CM | POA: Diagnosis not present

## 2023-09-14 DIAGNOSIS — Z794 Long term (current) use of insulin: Secondary | ICD-10-CM | POA: Diagnosis not present

## 2023-09-14 DIAGNOSIS — I5032 Chronic diastolic (congestive) heart failure: Secondary | ICD-10-CM | POA: Diagnosis not present

## 2023-09-14 DIAGNOSIS — Z6831 Body mass index (BMI) 31.0-31.9, adult: Secondary | ICD-10-CM | POA: Diagnosis not present

## 2023-09-14 DIAGNOSIS — A419 Sepsis, unspecified organism: Secondary | ICD-10-CM | POA: Diagnosis not present

## 2023-09-14 DIAGNOSIS — J9811 Atelectasis: Secondary | ICD-10-CM | POA: Diagnosis not present

## 2023-09-14 DIAGNOSIS — T85628A Displacement of other specified internal prosthetic devices, implants and grafts, initial encounter: Secondary | ICD-10-CM | POA: Diagnosis not present

## 2023-09-14 DIAGNOSIS — R070 Pain in throat: Secondary | ICD-10-CM | POA: Diagnosis not present

## 2023-09-14 DIAGNOSIS — I517 Cardiomegaly: Secondary | ICD-10-CM | POA: Diagnosis not present

## 2023-09-14 DIAGNOSIS — L299 Pruritus, unspecified: Secondary | ICD-10-CM | POA: Diagnosis not present

## 2023-09-14 DIAGNOSIS — Z743 Need for continuous supervision: Secondary | ICD-10-CM | POA: Diagnosis not present

## 2023-09-17 ENCOUNTER — Encounter: Payer: Self-pay | Admitting: Hematology

## 2023-09-18 ENCOUNTER — Inpatient Hospital Stay: Payer: PPO

## 2023-09-18 ENCOUNTER — Telehealth: Payer: Self-pay | Admitting: *Deleted

## 2023-09-18 DIAGNOSIS — I2510 Atherosclerotic heart disease of native coronary artery without angina pectoris: Secondary | ICD-10-CM | POA: Diagnosis not present

## 2023-09-18 DIAGNOSIS — C329 Malignant neoplasm of larynx, unspecified: Secondary | ICD-10-CM | POA: Diagnosis not present

## 2023-09-18 DIAGNOSIS — J9600 Acute respiratory failure, unspecified whether with hypoxia or hypercapnia: Secondary | ICD-10-CM | POA: Diagnosis not present

## 2023-09-18 DIAGNOSIS — I10 Essential (primary) hypertension: Secondary | ICD-10-CM | POA: Diagnosis not present

## 2023-09-18 DIAGNOSIS — Z931 Gastrostomy status: Secondary | ICD-10-CM | POA: Diagnosis not present

## 2023-09-18 DIAGNOSIS — J449 Chronic obstructive pulmonary disease, unspecified: Secondary | ICD-10-CM | POA: Diagnosis not present

## 2023-09-18 DIAGNOSIS — J9690 Respiratory failure, unspecified, unspecified whether with hypoxia or hypercapnia: Secondary | ICD-10-CM | POA: Diagnosis not present

## 2023-09-18 DIAGNOSIS — G893 Neoplasm related pain (acute) (chronic): Secondary | ICD-10-CM | POA: Diagnosis not present

## 2023-09-18 DIAGNOSIS — F1120 Opioid dependence, uncomplicated: Secondary | ICD-10-CM | POA: Diagnosis not present

## 2023-09-18 DIAGNOSIS — C799 Secondary malignant neoplasm of unspecified site: Secondary | ICD-10-CM | POA: Diagnosis not present

## 2023-09-18 DIAGNOSIS — G8928 Other chronic postprocedural pain: Secondary | ICD-10-CM | POA: Diagnosis not present

## 2023-09-18 DIAGNOSIS — E785 Hyperlipidemia, unspecified: Secondary | ICD-10-CM | POA: Diagnosis not present

## 2023-09-18 DIAGNOSIS — Z430 Encounter for attention to tracheostomy: Secondary | ICD-10-CM | POA: Diagnosis not present

## 2023-09-18 DIAGNOSIS — F419 Anxiety disorder, unspecified: Secondary | ICD-10-CM | POA: Diagnosis not present

## 2023-09-18 DIAGNOSIS — G894 Chronic pain syndrome: Secondary | ICD-10-CM | POA: Diagnosis not present

## 2023-09-18 NOTE — Telephone Encounter (Signed)
 Returned PC to patient's sister, Cary Clarks - she called earlier stating the patient had been seen at [facility], recently transferred to [place] & needs to have his infusion on 09/25/23.  Informed her he appointments have been scheduled - port flush & labs on 09/22/23 at 2:30 and to see Elise Guile at 3:00.  His infusion is scheduled on 09/25/23 at 1:00.  She verbalizes understanding & states she will arrange for transportation from [facility] for his appointments.

## 2023-09-19 ENCOUNTER — Other Ambulatory Visit: Payer: Self-pay | Admitting: Internal Medicine

## 2023-09-20 ENCOUNTER — Telehealth: Payer: Self-pay | Admitting: Hematology

## 2023-09-22 ENCOUNTER — Other Ambulatory Visit

## 2023-09-22 ENCOUNTER — Ambulatory Visit: Admitting: Physician Assistant

## 2023-09-22 ENCOUNTER — Inpatient Hospital Stay (HOSPITAL_BASED_OUTPATIENT_CLINIC_OR_DEPARTMENT_OTHER): Admitting: Physician Assistant

## 2023-09-22 ENCOUNTER — Inpatient Hospital Stay

## 2023-09-22 ENCOUNTER — Encounter: Payer: Self-pay | Admitting: Hematology

## 2023-09-22 VITALS — BP 121/83 | HR 118 | Temp 97.9°F | Resp 18 | Ht 72.0 in | Wt 220.0 lb

## 2023-09-22 DIAGNOSIS — J9811 Atelectasis: Secondary | ICD-10-CM | POA: Diagnosis not present

## 2023-09-22 DIAGNOSIS — Z923 Personal history of irradiation: Secondary | ICD-10-CM | POA: Insufficient documentation

## 2023-09-22 DIAGNOSIS — C771 Secondary and unspecified malignant neoplasm of intrathoracic lymph nodes: Secondary | ICD-10-CM

## 2023-09-22 DIAGNOSIS — C4492 Squamous cell carcinoma of skin, unspecified: Secondary | ICD-10-CM

## 2023-09-22 DIAGNOSIS — Z9221 Personal history of antineoplastic chemotherapy: Secondary | ICD-10-CM | POA: Insufficient documentation

## 2023-09-22 DIAGNOSIS — A419 Sepsis, unspecified organism: Secondary | ICD-10-CM | POA: Diagnosis not present

## 2023-09-22 DIAGNOSIS — C320 Malignant neoplasm of glottis: Secondary | ICD-10-CM | POA: Insufficient documentation

## 2023-09-22 DIAGNOSIS — R590 Localized enlarged lymph nodes: Secondary | ICD-10-CM | POA: Diagnosis not present

## 2023-09-22 DIAGNOSIS — C760 Malignant neoplasm of head, face and neck: Secondary | ICD-10-CM

## 2023-09-22 DIAGNOSIS — N281 Cyst of kidney, acquired: Secondary | ICD-10-CM | POA: Diagnosis not present

## 2023-09-22 DIAGNOSIS — Z8572 Personal history of non-Hodgkin lymphomas: Secondary | ICD-10-CM | POA: Insufficient documentation

## 2023-09-22 DIAGNOSIS — Z79899 Other long term (current) drug therapy: Secondary | ICD-10-CM | POA: Insufficient documentation

## 2023-09-22 DIAGNOSIS — Z5111 Encounter for antineoplastic chemotherapy: Secondary | ICD-10-CM

## 2023-09-22 DIAGNOSIS — J189 Pneumonia, unspecified organism: Secondary | ICD-10-CM | POA: Diagnosis not present

## 2023-09-22 DIAGNOSIS — R0602 Shortness of breath: Secondary | ICD-10-CM | POA: Diagnosis not present

## 2023-09-22 DIAGNOSIS — Z87891 Personal history of nicotine dependence: Secondary | ICD-10-CM | POA: Insufficient documentation

## 2023-09-22 DIAGNOSIS — Z7189 Other specified counseling: Secondary | ICD-10-CM

## 2023-09-22 DIAGNOSIS — Z95828 Presence of other vascular implants and grafts: Secondary | ICD-10-CM

## 2023-09-22 DIAGNOSIS — N200 Calculus of kidney: Secondary | ICD-10-CM | POA: Diagnosis not present

## 2023-09-22 LAB — CBC WITH DIFFERENTIAL (CANCER CENTER ONLY)
Abs Immature Granulocytes: 0.07 10*3/uL (ref 0.00–0.07)
Basophils Absolute: 0.1 10*3/uL (ref 0.0–0.1)
Basophils Relative: 0 %
Eosinophils Absolute: 0 10*3/uL (ref 0.0–0.5)
Eosinophils Relative: 0 %
HCT: 26.3 % — ABNORMAL LOW (ref 39.0–52.0)
Hemoglobin: 8.4 g/dL — ABNORMAL LOW (ref 13.0–17.0)
Immature Granulocytes: 1 %
Lymphocytes Relative: 15 %
Lymphs Abs: 2.1 10*3/uL (ref 0.7–4.0)
MCH: 26.3 pg (ref 26.0–34.0)
MCHC: 31.9 g/dL (ref 30.0–36.0)
MCV: 82.2 fL (ref 80.0–100.0)
Monocytes Absolute: 1.2 10*3/uL — ABNORMAL HIGH (ref 0.1–1.0)
Monocytes Relative: 8 %
Neutro Abs: 11.1 10*3/uL — ABNORMAL HIGH (ref 1.7–7.7)
Neutrophils Relative %: 76 %
Platelet Count: 335 10*3/uL (ref 150–400)
RBC: 3.2 MIL/uL — ABNORMAL LOW (ref 4.22–5.81)
RDW: 17.8 % — ABNORMAL HIGH (ref 11.5–15.5)
WBC Count: 14.5 10*3/uL — ABNORMAL HIGH (ref 4.0–10.5)
nRBC: 0 % (ref 0.0–0.2)

## 2023-09-22 LAB — CMP (CANCER CENTER ONLY)
ALT: 12 U/L (ref 0–44)
AST: 14 U/L — ABNORMAL LOW (ref 15–41)
Albumin: 3.4 g/dL — ABNORMAL LOW (ref 3.5–5.0)
Alkaline Phosphatase: 152 U/L — ABNORMAL HIGH (ref 38–126)
Anion gap: 7 (ref 5–15)
BUN: 26 mg/dL — ABNORMAL HIGH (ref 8–23)
CO2: 34 mmol/L — ABNORMAL HIGH (ref 22–32)
Calcium: 9.5 mg/dL (ref 8.9–10.3)
Chloride: 92 mmol/L — ABNORMAL LOW (ref 98–111)
Creatinine: 0.96 mg/dL (ref 0.61–1.24)
GFR, Estimated: >60 mL/min (ref >60)
Glucose, Bld: 183 mg/dL — ABNORMAL HIGH (ref 70–99)
Potassium: 4.8 mmol/L (ref 3.5–5.1)
Sodium: 133 mmol/L — ABNORMAL LOW (ref 135–145)
Total Bilirubin: 0.3 mg/dL (ref 0.0–1.2)
Total Protein: 8.2 g/dL — ABNORMAL HIGH (ref 6.5–8.1)

## 2023-09-22 LAB — TSH: TSH: 52.8 u[IU]/mL — ABNORMAL HIGH (ref 0.350–4.500)

## 2023-09-22 MED ORDER — SODIUM CHLORIDE 0.9% FLUSH
10.0000 mL | Freq: Once | INTRAVENOUS | Status: AC
  Administered 2023-09-22: 10 mL

## 2023-09-22 NOTE — Progress Notes (Addendum)
 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  Interval History:  Jesus [name] 69 y.o. male who is here for continued evaluation and management of metastatic laryngeal squamous cell carcinoma.  Patient was last seen by Dr. Salomon Cree on 07/25/2023.  In the interim, he was admitted at [facility] on 08/02/2023 due to large paratracheal mass that was compressing his airway after completion of radiation.  Patient underwent emergent tracheostomy with thoracic surgery.  On 09/02/2023 patient had significant desaturation event and respiratory distress related to mucous plugging resulting in a transfer to the MICU.  Bronchoscopy was conducted on 09/02/2023 that demonstrated concern for malignancy near the opening of the trachea distal to the tracheostomy with placement of XLT per trauma surgery.  A CT of his neck was done on 08/29/2023 which was concerning for tumor invasion into the trachea with increased necrosis of the tracheal mass and necrotic lymph nodes.  He was presented at the tumor board on 09/01/2023 at [facility] with the decision to move forward with systemic chemotherapy and he received cycle 1 day 1 of Carbo/Taxol.  In addition he was found to have iron deficiency and received IV iron and 1 unit of PRBC.  He is currently residing at [facility] starting 09/14/2023.  Mr. Luecke reports that he is recovering from his hospitalization from  [facility].  His energy levels are fair and he is currently PEG tube dependent for nutrition.  He denies nausea, vomiting or bowel habit changes.  He is currently receiving iron supplementation through his feeding tube.  He denies any overt signs of bleeding including hematochezia, melena or bleeding near the tracheostomy site he denies fevers, chills, night sweats, shortness of breath, chest pain or cough.  He has no other complaints.  Rest of the 10 point ROS was reviewed and negative.   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- last radiation 06-2018   Cataract    removed both eyes with l;ens implants    COPD (chronic obstructive pulmonary disease) (HCC)    Dyspnea    Family history of colon cancer    in his brother- dx'd age 48    History of chemotherapy    last 06-01-2018   History of kidney stones    History of radiation therapy    last radiation 06-2018   Hyperlipidemia    currently under control   Hypertension    Irregular heart beats    Lymphadenopathy    Pain, lower leg    Bilateral   Peripheral arterial disease (HCC)    Pre-diabetes    Red-green color blindness    RLS (restless legs syndrome)    Snores    Wears glasses  SURGICAL HISTORY: Past Surgical History:  Procedure Laterality Date   CATARACT EXTRACTION W/ INTRAOCULAR LENS  IMPLANT, BILATERAL     COLONOSCOPY     DIRECT LARYNGOSCOPY Right 02/03/2022   Procedure: DIRECT LARYNGOSCOPY WITH BIOPSY OF RIGHT FALSE VOCAL CORD;  Surgeon: Ammon Bales, MD;  Location: [facility] OR;  Service: ENT;  Laterality: Right;   dislodged salava stone     ESOPHAGOSCOPY WITH DILITATION N/A 07/19/2023   Procedure: ESOPHAGOSCOPY, WITH DILATION;  Surgeon: Janita Mellow, MD;  Location: [facility] OR;  Service: ENT;  Laterality: N/A;    FRACTURE SURGERY     HAND ARTHROPLASTY  1995   crushed left hand   INGUINAL LYMPH NODE BIOPSY Left 01/02/2018   Procedure: LEFT INGUINAL LYMPH NODE BIOPSY;  Surgeon: Enid Harry, MD;  Location: [facility] OR;  Service: General;  Laterality: Left;   IR IMAGING GUIDED PORT INSERTION  01/15/2018   IR IMAGING GUIDED PORT INSERTION  08/10/2022   IR REMOVAL TUN ACCESS W/ PORT W/O FL MOD SED  03/11/2019   IR REPLACE G-TUBE SIMPLE WO FLUORO  06/21/2023   LAPAROSCOPIC INSERTION GASTROSTOMY TUBE N/A 06/19/2023   Procedure: LAPAROSCOPIC INSERTION GASTROSTOMY TUBE;  Surgeon: Junie Olds, MD;  Location: [facility] ORS;  Service: General;  Laterality: N/A;   LAPAROSCOPIC INSERTION GASTROSTOMY TUBE N/A 06/21/2023   Procedure: LAPAROSCOPIC INSERTION GASTROSTOMY TUBE;  Surgeon: Junie Olds, MD;  Location: [facility] ORS;  Service: General;  Laterality: N/A;   MEDIASTINOSCOPY N/A 07/22/2022   Procedure: MEDIASTINOSCOPY;  Surgeon: Zelphia Higashi, MD;  Location: [facility] OR;  Service: Thoracic;  Laterality: N/A;   MICROLARYNGOSCOPY Left 01/17/2014   Procedure: MICROLARYNGOSCOPY WITH EXCISION OF THE BIOPSY OF LEFT VOCAL CORD LESION;  Surgeon: Janita Mellow, MD;  Location: [facility];  Service: ENT;  Laterality: Left;   MICROLARYNGOSCOPY N/A 09/16/2020   Procedure: MICROLARYNGOSCOPY with Biopsy of vocal cord lesion;  Surgeon: Ammon Bales, MD;  Location: [facility] OR;  Service: ENT;  Laterality: N/A;   ORIF FOOT FRACTURE  2005   left   REFRACTIVE SURGERY Right    removed cloudiness in right eye after cataract removal     SOCIAL HISTORY: Social History   Socioeconomic History   Marital status: Divorced    Spouse name: Not on file   Number of children: 3   Years of education: Not on file   Highest education level: Not on file  Occupational History   Not on file  Tobacco Use   Smoking status: Former    Current packs/day: 0.00    Average packs/day: 0.5 packs/day for 36.0 years (18.0 ttl pk-yrs)     Types: Cigarettes    Start date: 09/28/1984    Quit date: 09/28/2020    Years since quitting: 2.9   Smokeless tobacco: Never   Tobacco comments:    he denies smoking in about 2 weeks 07/13/18  Vaping Use   Vaping status: Never Used  Substance and Sexual Activity   Alcohol use: Not Currently    Alcohol/week: 6.0 standard drinks of alcohol    Types: 6 Cans of beer per week    Comment: Last use: Friday 30th   Drug use: Yes    Types: Marijuana, Cocaine    Comment: "Edibles"   Sexual activity: Not Currently  Other Topics Concern   Not on file  Social History Narrative   Not on file   Social Drivers of Health   Financial Resource Strain: Low Risk  (10/06/2020)   Overall Financial Resource Strain (CARDIA)  Difficulty of Paying Living Expenses: Not very hard  Food Insecurity: No Food Insecurity (07/29/2023)   Hunger Vital Sign    Worried About Running Out of Food in the Last Year: Never true    Ran Out of Food in the Last Year: Never true  Transportation Needs: Unmet Transportation Needs (07/29/2023)   PRAPARE - Administrator, Civil Service (Medical): Yes    Lack of Transportation (Non-Medical): No  Physical Activity: Not on file  Stress: Not on file  Social Connections: Socially Isolated (07/29/2023)   Social Connection and Isolation Panel [NHANES]    Frequency of Communication with Friends and Family: Three times a week    Frequency of Social Gatherings with Friends and Family: Never    Attends Religious Services: Never    Database administrator or Organizations: No    Attends Banker Meetings: Never    Marital Status: Divorced  Catering manager Violence: Not At Risk (07/29/2023)   Humiliation, Afraid, Rape, and Kick questionnaire    Fear of Current or Ex-Partner: No    Emotionally Abused: No    Physically Abused: No    Sexually Abused: No    FAMILY HISTORY: Family History  Problem Relation Age of Onset   Breast cancer Mother    Diabetes Father     Hypertension Father    Stroke Father    Mental illness Sister    Hypertension Daughter    Mental illness Daughter    Hypertension Brother    Colon cancer Brother 91       passed away 11-17-2018   Breast cancer Sister    Esophageal cancer Neg Hx    Colon polyps Neg Hx    Rectal cancer Neg Hx    Stomach cancer Neg Hx     ALLERGIES:  is allergic to bee venom, lisinopril , antifungal [miconazole nitrate], and zolpidem tartrate er.  MEDICATIONS:  Current Outpatient Medications  Medication Sig Dispense Refill   acetaminophen  (TYLENOL ) 160 MG/5ML solution Place 20.3 mLs (650 mg total) into feeding tube every 6 (six) hours as needed for mild pain (pain score 1-3). 473 mL 0   albuterol  (VENTOLIN  HFA) 108 (90 Base) MCG/ACT inhaler Inhale 2 puffs into the lungs every 6 (six) hours as needed for wheezing or shortness of breath. (Patient taking differently: Inhale 2 puffs into the lungs every 4 (four) hours as needed for wheezing or shortness of breath.) 6.7 g 2   fentaNYL  37.5 MCG/HR PT72 Apply 1 patch topically.     ferrous sulfate  220 (44 Fe) MG/5ML solution Place 6.8 mLs (300 mg total) into feeding tube every other day. 106 mL 0   gabapentin  (NEURONTIN ) 300 MG capsule Take 300 mg by mouth 2 (two) times daily.     glycopyrrolate  (ROBINUL ) 1 MG tablet Place 1 tablet (1 mg total) into feeding tube every other day. Crush tablet, place in water , and then place in feeding tube. (Patient taking differently: Place 1 mg into feeding tube See admin instructions. Crush one 1 mg tablet, place in water , and into the feeding tube- ONCE A DAY AS NEEDED FOR EXCESS SECRETIONS)     guaiFENesin  (ROBITUSSIN) 100 MG/5ML liquid 200 mg by Enteral route.     hydrOXYzine  (ATARAX ) 25 MG tablet 25 mg by Enteral route every 6 (six) hours as needed.     ipratropium-albuterol  (DUONEB) 0.5-2.5 (3) MG/3ML SOLN Take 3 mLs by nebulization every 6 (six) hours as needed.     lidocaine -prilocaine  (EMLA ) cream Apply  to affected area  once (Patient taking differently: Apply 1 Application topically as needed (for port access).) 30 g 3   melatonin 3 MG TABS tablet 6 mg by Enteral route at bedtime.     Menthol-Zinc  Oxide (CALMOSEPTINE) 0.44-20.6 % OINT Apply topically.     Nutritional Supplements (NUTREN 1.5) LIQD Give 2 cartons 4 times a day.  Flush with 30ml of water  before and after each feeding.  Give additional 360ml of water  TID for additional hydration.     oxyCODONE  (ROXICODONE ) 5 MG/5ML solution Place 5 mLs (5 mg total) into feeding tube every 6 (six) hours as needed for breakthrough pain. 75 mL 0   pantoprazole  sodium (PROTONIX ) 40 mg Place 40 mg into feeding tube daily in the afternoon.     polyethylene glycol (MIRALAX  / GLYCOLAX ) 17 g packet 17 g by Enteral route daily as needed.     Protein (FEEDING SUPPLEMENT, PROSOURCE TF20,) liquid Place 60 mLs into feeding tube daily. 1800 mL 2   sennosides (SENOKOT) 8.8 MG/5ML syrup 8.8 mg by Enteral route at bedtime as needed.     Water  For Irrigation, Sterile (STERILE WATER ) Irrigate with 125 mLs as directed every 4 (four) hours while awake.     Blood Glucose Monitoring Suppl DEVI 1 each by Does not apply route in the morning, at noon, and at bedtime. May substitute to any manufacturer covered by patient's insurance. 1 each 0   insulin  glargine (LANTUS ) 100 UNIT/ML Solostar Pen Inject 15 Units into the skin at bedtime. (Patient not taking: Reported on 09/22/2023) 5 mL 1   Insulin  Pen Needle (PEN NEEDLE/5-BEVEL TIP) 32G X 4 MM MISC 1 each by Does not apply route at bedtime. (Patient not taking: Reported on 09/22/2023) 100 each 1   No current facility-administered medications for this visit.   Facility-Administered Medications Ordered in Other Visits  Medication Dose Route Frequency Provider Last Rate Last Admin   sodium chloride  flush (NS) 0.9 % injection 10 mL  10 mL Intracatheter PRN Frankie Israel, MD   10 mL at 06/13/23 1745     PHYSICAL EXAMINATION: .BP 121/83 (BP  Location: Left Arm, Patient Position: Sitting)   Pulse (!) 118 Comment: nurse is aware  Temp 97.9 F (36.6 C) (Temporal)   Resp 18   Ht 6' (1.829 m)   Wt 220 lb (99.8 kg)   SpO2 98%   BMI 29.84 kg/m   GENERAL:alert, in no acute distress and comfortable SKIN: no acute rashes, no significant lesions EYES: conjunctiva are pink and non-injected, sclera anicteric NECK: Tracheostomy in place without any signs of infection. LUNGS: clear to auscultation b/l with normal respiratory effort HEART: regular rate & rhythm Extremity: no pedal edema PSYCH: alert & oriented x 3 with fluent speech NEURO: no focal motor/sensory deficits   LABORATORY DATA:   Latest Reference Range & Units 09/22/23 10:19  Sodium 135 - 145 mmol/L 133 (L)  Potassium 3.5 - 5.1 mmol/L 4.8  Chloride 98 - 111 mmol/L 92 (L)  CO2 22 - 32 mmol/L 34 (H)  Glucose 70 - 99 mg/dL 409 (H)  BUN 8 - 23 mg/dL 26 (H)  Creatinine 8.11 - 1.24 mg/dL 9.14  Calcium  8.9 - 10.3 mg/dL 9.5  Anion gap 5 - 15  7  Alkaline Phosphatase 38 - 126 U/L 152 (H)  Albumin 3.5 - 5.0 g/dL 3.4 (L)  AST 15 - 41 U/L 14 (L)  ALT 0 - 44 U/L 12  Total Protein 6.5 - 8.1 g/dL 8.2 (  H)  Total Bilirubin 0.0 - 1.2 mg/dL 0.3  GFR, Est Non African American >60 mL/min >60  WBC 4.0 - 10.5 K/uL 14.5 (H)  RBC 4.22 - 5.81 MIL/uL 3.20 (L)  Hemoglobin 13.0 - 17.0 g/dL 8.4 (L)  HCT 16.1 - 09.6 % 26.3 (L)  MCV 80.0 - 100.0 fL 82.2  MCH 26.0 - 34.0 pg 26.3  MCHC 30.0 - 36.0 g/dL 04.5  RDW 40.9 - 81.1 % 17.8 (H)  Platelets 150 - 400 K/uL 335  nRBC 0.0 - 0.2 % 0.0  Neutrophils % 76  Lymphocytes % 15  Monocytes Relative % 8  Eosinophil % 0  Basophil % 0  Immature Granulocytes % 1  NEUT# 1.7 - 7.7 K/uL 11.1 (H)  Lymphs Abs 0.7 - 4.0 K/uL 2.1  Monocyte # 0.1 - 1.0 K/uL 1.2 (H)  Eosinophils Absolute 0.0 - 0.5 K/uL 0.0  Basophils Absolute 0.0 - 0.1 K/uL 0.1  Abs Immature Granulocytes 0.00 - 0.07 K/uL 0.07  (L): Data is abnormally low (H): Data is abnormally  high  01/02/18 Left Inguinal LN Bx:   12/26/17 Tissue Flow Cytometry:   12/26/17 Inguinal Core biopsy:      RADIOGRAPHIC STUDIES: I have personally reviewed the radiological images as listed and agreed with the findings in the report. No results found.    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  -01/02/18 Left inguinal LN Biopsy revealed T-Cell/histocyte rich Large B-Cell Lymphoma  -01/05/18 PET/CT revealed Massively enlarged pelvic lymph nodes intense metabolic activity consistent lymphoma. 2. Additional hypermetabolic lymph nodes in the porta hepatis and retroperitoneum LEFT aorta. 3. Solitary hypermetabolic mediastinal lymph node in the upper LEFT Mediastinum. 4. 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 5. Normal spleen.    -Received 6 cycles of EPOCH-R completed on 06/01/18 - 06/28/18 PET/CT revealed Continued good response to treatment. No residual measurable or hypermetabolic abdominal lymphadenopathy and no recurrent osseous disease. 2. Interval decrease in size of the left operator region lymph node and also the left inguinal lymph node. However, the both have small foci of slightly increased hypermetabolism which bears surveillance. 3. No new or progressive lymphadenopathy in the neck, chest, abdomen or pelvis. -Received  radiation to pelvic lymphadenopathy from 07/25/2018-08/23/2018. Received 39.6 Gy in 22 fractions  2. Metastatic laryngeal squamous cell carcinoma P16 neg, EBV neg --Received radiation the larynx from 10/15/2020-11/25/2020. Received 63 Gy in 28 Fx.  --Started systemic therapy with carboplatin /5FU/Pembro on 08/15/2022. Transitioned to single agent Pembro on 11/08/22 after 4 cycles of chemo/immunotherapy. Last treatment on  07/25/2023. --Received palliative radiation to upper chest lymph nodes completed on 08/02/2023. Received 14.8 Gy in 4 Fx.  --Admitted at [facility] on 08/02/2023 due to large paratracheal mass that was compressing his airway after completion of radiation.  Patient underwent emergent tracheostomy with thoracic surgery.  On 09/02/2023 patient had significant desaturation event and respiratory distress related to mucous plugging resulting in a transfer to the MICU.  Bronchoscopy was conducted on 09/02/2023 that demonstrated concern for malignancy near the opening of the trachea distal to the tracheostomy with placement of XLT per trauma surgery.  A CT of his neck was done on 08/29/2023 which was concerning for tumor invasion into the trachea with increased necrosis of the tracheal mass and necrotic lymph nodes.  He was presented at the tumor board on 09/01/2023 at  [facility] with the decision to move forward with systemic chemotherapy and he received cycle 1 day 1 of Carbo/Taxol.   PLAN: --Due for Cycle 2, Day 1 of Carbo/Taxol on 09/25/2023. Orders will be placed by Dr. Salomon Cree.  --Labs from today were reviewed and adequate for treatment. WBC 14.5, Hgb 8.4, Plt 335, creatinine and LFTs in range --Proceed with treatment on Monday as planned  --Will check iron panel to see if additional IV iron is needed. --RTC in 3 weeks with labs and follow up prior to Cycle 3, Day 1 of carbo/taxol.     All of the patient's questions were answered with apparent satisfaction. The patient knows to call the clinic with any problems, questions or concerns.   I have spent a total of 30 minutes minutes of face-to-face and non-face-to-face time, preparing to see the patient,  performing a medically appropriate examination, counseling and educating the patient,  documenting clinical information in the electronic health record, independently interpreting results and communicating results to the patient, and care coordination.   Wyline Hearing PA-C Dept  of Hematology and Oncology [facility] at [facility] Phone: [phone]  ADDENDUM  .Patient was Personally and independently interviewed, examined and relevant elements of the history of present illness were reviewed in details and an assessment and plan was created. All elements of the patient's history of present illness , assessment and plan were discussed in details with Wyline Hearing PA-C. The above documentation reflects our combined findings assessment and plan. Patient has been started on carboplatin  Taxol chemotherapy and had the first cycle at [facility].  He also had a short course of 4 doses of radiation for his recurrence of his squamous cell carcinoma in the tracheoesophageal groove that was eroding into the trachea.  He has an indwelling tracheostomy tube and is currently on oxygen.  He has a PEG tube for tube feeding.  He has obviously lost a significant amount of weight.  I did discuss goals of care with him and he would like to proceed with continued chemotherapy which he understands is palliative at this time.  We will potentially add additional medications or switch treatments based on treatment response to carboplatin  Taxol.  Jacquelyn Matt MD MS .The total time spent in the appointment was 40 minutes* .  All of the patient's questions were answered with apparent satisfaction. The patient knows to call the clinic with any problems, questions or concerns.   Jacquelyn Matt MD MS AAHIVMS [facility] Hematology/Oncology Physician [facility]  .*Total Encounter Time as defined by the Centers for Medicare and Medicaid Services includes, in addition to the face-to-face time of a patient visit (documented in the note above) non-face-to-face time: obtaining and reviewing outside history, ordering and reviewing medications, tests or procedures, care coordination (communications with other health care professionals or caregivers) , ordering new  chemotherapy regimen and documentation in the medical record.

## 2023-09-23 LAB — T4: T4, Total: 4 ug/dL — ABNORMAL LOW (ref 4.5–12.0)

## 2023-09-25 ENCOUNTER — Inpatient Hospital Stay (HOSPITAL_COMMUNITY)
Admission: EM | Admit: 2023-09-25 | Discharge: 2023-11-07 | DRG: 871 | Disposition: E | Source: Skilled Nursing Facility | Attending: Student | Admitting: Student

## 2023-09-25 ENCOUNTER — Emergency Department (HOSPITAL_COMMUNITY)

## 2023-09-25 ENCOUNTER — Encounter (HOSPITAL_COMMUNITY): Payer: Self-pay

## 2023-09-25 ENCOUNTER — Inpatient Hospital Stay

## 2023-09-25 ENCOUNTER — Ambulatory Visit: Admitting: Physician Assistant

## 2023-09-25 ENCOUNTER — Encounter: Payer: Self-pay | Admitting: Hematology

## 2023-09-25 ENCOUNTER — Other Ambulatory Visit: Payer: Self-pay

## 2023-09-25 DIAGNOSIS — R0603 Acute respiratory distress: Secondary | ICD-10-CM | POA: Diagnosis not present

## 2023-09-25 DIAGNOSIS — T85628A Displacement of other specified internal prosthetic devices, implants and grafts, initial encounter: Secondary | ICD-10-CM | POA: Diagnosis not present

## 2023-09-25 DIAGNOSIS — J9621 Acute and chronic respiratory failure with hypoxia: Secondary | ICD-10-CM | POA: Diagnosis present

## 2023-09-25 DIAGNOSIS — Z6831 Body mass index (BMI) 31.0-31.9, adult: Secondary | ICD-10-CM

## 2023-09-25 DIAGNOSIS — Z9221 Personal history of antineoplastic chemotherapy: Secondary | ICD-10-CM

## 2023-09-25 DIAGNOSIS — J189 Pneumonia, unspecified organism: Secondary | ICD-10-CM | POA: Diagnosis present

## 2023-09-25 DIAGNOSIS — E1165 Type 2 diabetes mellitus with hyperglycemia: Secondary | ICD-10-CM | POA: Diagnosis not present

## 2023-09-25 DIAGNOSIS — J9503 Malfunction of tracheostomy stoma: Secondary | ICD-10-CM | POA: Diagnosis not present

## 2023-09-25 DIAGNOSIS — Z803 Family history of malignant neoplasm of breast: Secondary | ICD-10-CM

## 2023-09-25 DIAGNOSIS — Z794 Long term (current) use of insulin: Secondary | ICD-10-CM | POA: Diagnosis not present

## 2023-09-25 DIAGNOSIS — N529 Male erectile dysfunction, unspecified: Secondary | ICD-10-CM | POA: Diagnosis present

## 2023-09-25 DIAGNOSIS — C320 Malignant neoplasm of glottis: Secondary | ICD-10-CM | POA: Diagnosis not present

## 2023-09-25 DIAGNOSIS — R590 Localized enlarged lymph nodes: Secondary | ICD-10-CM | POA: Diagnosis not present

## 2023-09-25 DIAGNOSIS — E440 Moderate protein-calorie malnutrition: Secondary | ICD-10-CM | POA: Diagnosis present

## 2023-09-25 DIAGNOSIS — R591 Generalized enlarged lymph nodes: Secondary | ICD-10-CM | POA: Diagnosis present

## 2023-09-25 DIAGNOSIS — N281 Cyst of kidney, acquired: Secondary | ICD-10-CM | POA: Diagnosis not present

## 2023-09-25 DIAGNOSIS — Z923 Personal history of irradiation: Secondary | ICD-10-CM

## 2023-09-25 DIAGNOSIS — D509 Iron deficiency anemia, unspecified: Secondary | ICD-10-CM | POA: Diagnosis present

## 2023-09-25 DIAGNOSIS — G9341 Metabolic encephalopathy: Secondary | ICD-10-CM | POA: Diagnosis present

## 2023-09-25 DIAGNOSIS — I5032 Chronic diastolic (congestive) heart failure: Secondary | ICD-10-CM | POA: Diagnosis present

## 2023-09-25 DIAGNOSIS — C8338 Diffuse large B-cell lymphoma, lymph nodes of multiple sites: Secondary | ICD-10-CM | POA: Diagnosis not present

## 2023-09-25 DIAGNOSIS — E1151 Type 2 diabetes mellitus with diabetic peripheral angiopathy without gangrene: Secondary | ICD-10-CM | POA: Diagnosis present

## 2023-09-25 DIAGNOSIS — I959 Hypotension, unspecified: Secondary | ICD-10-CM | POA: Diagnosis present

## 2023-09-25 DIAGNOSIS — Z515 Encounter for palliative care: Secondary | ICD-10-CM | POA: Diagnosis not present

## 2023-09-25 DIAGNOSIS — Z87891 Personal history of nicotine dependence: Secondary | ICD-10-CM

## 2023-09-25 DIAGNOSIS — Z800 Family history of malignant neoplasm of digestive organs: Secondary | ICD-10-CM

## 2023-09-25 DIAGNOSIS — G8929 Other chronic pain: Secondary | ICD-10-CM | POA: Diagnosis present

## 2023-09-25 DIAGNOSIS — Z1152 Encounter for screening for COVID-19: Secondary | ICD-10-CM

## 2023-09-25 DIAGNOSIS — L8990 Pressure ulcer of unspecified site, unspecified stage: Secondary | ICD-10-CM | POA: Insufficient documentation

## 2023-09-25 DIAGNOSIS — E875 Hyperkalemia: Secondary | ICD-10-CM | POA: Diagnosis not present

## 2023-09-25 DIAGNOSIS — R404 Transient alteration of awareness: Secondary | ICD-10-CM | POA: Diagnosis not present

## 2023-09-25 DIAGNOSIS — G894 Chronic pain syndrome: Secondary | ICD-10-CM | POA: Diagnosis not present

## 2023-09-25 DIAGNOSIS — Z743 Need for continuous supervision: Secondary | ICD-10-CM | POA: Diagnosis not present

## 2023-09-25 DIAGNOSIS — L89151 Pressure ulcer of sacral region, stage 1: Secondary | ICD-10-CM | POA: Diagnosis present

## 2023-09-25 DIAGNOSIS — B961 Klebsiella pneumoniae [K. pneumoniae] as the cause of diseases classified elsewhere: Secondary | ICD-10-CM | POA: Diagnosis present

## 2023-09-25 DIAGNOSIS — Z7189 Other specified counseling: Secondary | ICD-10-CM | POA: Diagnosis not present

## 2023-09-25 DIAGNOSIS — Z833 Family history of diabetes mellitus: Secondary | ICD-10-CM

## 2023-09-25 DIAGNOSIS — J440 Chronic obstructive pulmonary disease with acute lower respiratory infection: Secondary | ICD-10-CM | POA: Diagnosis present

## 2023-09-25 DIAGNOSIS — Z604 Social exclusion and rejection: Secondary | ICD-10-CM | POA: Diagnosis present

## 2023-09-25 DIAGNOSIS — Y828 Other medical devices associated with adverse incidents: Secondary | ICD-10-CM | POA: Diagnosis not present

## 2023-09-25 DIAGNOSIS — Z8249 Family history of ischemic heart disease and other diseases of the circulatory system: Secondary | ICD-10-CM

## 2023-09-25 DIAGNOSIS — Z87442 Personal history of urinary calculi: Secondary | ICD-10-CM

## 2023-09-25 DIAGNOSIS — I10 Essential (primary) hypertension: Secondary | ICD-10-CM | POA: Diagnosis not present

## 2023-09-25 DIAGNOSIS — I110 Hypertensive heart disease with heart failure: Secondary | ICD-10-CM | POA: Diagnosis not present

## 2023-09-25 DIAGNOSIS — I517 Cardiomegaly: Secondary | ICD-10-CM | POA: Diagnosis not present

## 2023-09-25 DIAGNOSIS — G2581 Restless legs syndrome: Secondary | ICD-10-CM | POA: Diagnosis present

## 2023-09-25 DIAGNOSIS — I468 Cardiac arrest due to other underlying condition: Secondary | ICD-10-CM | POA: Diagnosis not present

## 2023-09-25 DIAGNOSIS — F29 Unspecified psychosis not due to a substance or known physiological condition: Secondary | ICD-10-CM | POA: Diagnosis not present

## 2023-09-25 DIAGNOSIS — K219 Gastro-esophageal reflux disease without esophagitis: Secondary | ICD-10-CM | POA: Diagnosis not present

## 2023-09-25 DIAGNOSIS — Z91030 Bee allergy status: Secondary | ICD-10-CM

## 2023-09-25 DIAGNOSIS — R6520 Severe sepsis without septic shock: Secondary | ICD-10-CM | POA: Diagnosis present

## 2023-09-25 DIAGNOSIS — D638 Anemia in other chronic diseases classified elsewhere: Secondary | ICD-10-CM | POA: Diagnosis not present

## 2023-09-25 DIAGNOSIS — E43 Unspecified severe protein-calorie malnutrition: Secondary | ICD-10-CM | POA: Diagnosis present

## 2023-09-25 DIAGNOSIS — R0602 Shortness of breath: Secondary | ICD-10-CM | POA: Diagnosis not present

## 2023-09-25 DIAGNOSIS — G893 Neoplasm related pain (acute) (chronic): Secondary | ICD-10-CM | POA: Diagnosis present

## 2023-09-25 DIAGNOSIS — D630 Anemia in neoplastic disease: Secondary | ICD-10-CM | POA: Diagnosis not present

## 2023-09-25 DIAGNOSIS — Z931 Gastrostomy status: Secondary | ICD-10-CM

## 2023-09-25 DIAGNOSIS — D491 Neoplasm of unspecified behavior of respiratory system: Secondary | ICD-10-CM | POA: Diagnosis present

## 2023-09-25 DIAGNOSIS — D75839 Thrombocytosis, unspecified: Secondary | ICD-10-CM | POA: Diagnosis not present

## 2023-09-25 DIAGNOSIS — J860 Pyothorax with fistula: Secondary | ICD-10-CM | POA: Diagnosis present

## 2023-09-25 DIAGNOSIS — J9811 Atelectasis: Secondary | ICD-10-CM | POA: Diagnosis not present

## 2023-09-25 DIAGNOSIS — E663 Overweight: Secondary | ICD-10-CM | POA: Diagnosis present

## 2023-09-25 DIAGNOSIS — C7951 Secondary malignant neoplasm of bone: Secondary | ICD-10-CM | POA: Diagnosis not present

## 2023-09-25 DIAGNOSIS — E871 Hypo-osmolality and hyponatremia: Secondary | ICD-10-CM | POA: Diagnosis not present

## 2023-09-25 DIAGNOSIS — E785 Hyperlipidemia, unspecified: Secondary | ICD-10-CM | POA: Diagnosis present

## 2023-09-25 DIAGNOSIS — I96 Gangrene, not elsewhere classified: Secondary | ICD-10-CM | POA: Diagnosis not present

## 2023-09-25 DIAGNOSIS — Z823 Family history of stroke: Secondary | ICD-10-CM

## 2023-09-25 DIAGNOSIS — J449 Chronic obstructive pulmonary disease, unspecified: Secondary | ICD-10-CM | POA: Diagnosis not present

## 2023-09-25 DIAGNOSIS — J9601 Acute respiratory failure with hypoxia: Secondary | ICD-10-CM | POA: Diagnosis present

## 2023-09-25 DIAGNOSIS — R918 Other nonspecific abnormal finding of lung field: Secondary | ICD-10-CM | POA: Diagnosis not present

## 2023-09-25 DIAGNOSIS — C329 Malignant neoplasm of larynx, unspecified: Secondary | ICD-10-CM | POA: Diagnosis not present

## 2023-09-25 DIAGNOSIS — N200 Calculus of kidney: Secondary | ICD-10-CM | POA: Diagnosis not present

## 2023-09-25 DIAGNOSIS — Z888 Allergy status to other drugs, medicaments and biological substances status: Secondary | ICD-10-CM

## 2023-09-25 DIAGNOSIS — Z818 Family history of other mental and behavioral disorders: Secondary | ICD-10-CM

## 2023-09-25 DIAGNOSIS — Z930 Tracheostomy status: Secondary | ICD-10-CM | POA: Diagnosis not present

## 2023-09-25 DIAGNOSIS — H5353 Deuteranomaly: Secondary | ICD-10-CM | POA: Diagnosis present

## 2023-09-25 DIAGNOSIS — A419 Sepsis, unspecified organism: Principal | ICD-10-CM | POA: Diagnosis present

## 2023-09-25 DIAGNOSIS — Z5982 Transportation insecurity: Secondary | ICD-10-CM

## 2023-09-25 DIAGNOSIS — Z79899 Other long term (current) drug therapy: Secondary | ICD-10-CM

## 2023-09-25 DIAGNOSIS — R1310 Dysphagia, unspecified: Secondary | ICD-10-CM | POA: Diagnosis present

## 2023-09-25 LAB — PROTIME-INR
INR: 1.2 (ref 0.8–1.2)
Prothrombin Time: 15.4 s — ABNORMAL HIGH (ref 11.4–15.2)

## 2023-09-25 LAB — CBC WITH DIFFERENTIAL/PLATELET
Abs Immature Granulocytes: 0.13 10*3/uL — ABNORMAL HIGH (ref 0.00–0.07)
Basophils Absolute: 0.1 10*3/uL (ref 0.0–0.1)
Basophils Relative: 0 %
Eosinophils Absolute: 0 10*3/uL (ref 0.0–0.5)
Eosinophils Relative: 0 %
HCT: 28.3 % — ABNORMAL LOW (ref 39.0–52.0)
Hemoglobin: 8.5 g/dL — ABNORMAL LOW (ref 13.0–17.0)
Immature Granulocytes: 1 %
Lymphocytes Relative: 9 %
Lymphs Abs: 1.7 10*3/uL (ref 0.7–4.0)
MCH: 26.2 pg (ref 26.0–34.0)
MCHC: 30 g/dL (ref 30.0–36.0)
MCV: 87.1 fL (ref 80.0–100.0)
Monocytes Absolute: 1.2 10*3/uL — ABNORMAL HIGH (ref 0.1–1.0)
Monocytes Relative: 6 %
Neutro Abs: 16.7 10*3/uL — ABNORMAL HIGH (ref 1.7–7.7)
Neutrophils Relative %: 84 %
Platelets: 458 10*3/uL — ABNORMAL HIGH (ref 150–400)
RBC: 3.25 MIL/uL — ABNORMAL LOW (ref 4.22–5.81)
RDW: 17.8 % — ABNORMAL HIGH (ref 11.5–15.5)
WBC: 19.7 10*3/uL — ABNORMAL HIGH (ref 4.0–10.5)
nRBC: 0 % (ref 0.0–0.2)

## 2023-09-25 LAB — URINALYSIS, W/ REFLEX TO CULTURE (INFECTION SUSPECTED)
Bacteria, UA: NONE SEEN
Bilirubin Urine: NEGATIVE
Glucose, UA: NEGATIVE mg/dL
Hgb urine dipstick: NEGATIVE
Ketones, ur: NEGATIVE mg/dL
Leukocytes,Ua: NEGATIVE
Nitrite: NEGATIVE
Protein, ur: 30 mg/dL — AB
Specific Gravity, Urine: 1.015 (ref 1.005–1.030)
pH: 8 (ref 5.0–8.0)

## 2023-09-25 LAB — COMPREHENSIVE METABOLIC PANEL WITH GFR
ALT: 14 U/L (ref 0–44)
AST: 15 U/L (ref 15–41)
Albumin: 2.7 g/dL — ABNORMAL LOW (ref 3.5–5.0)
Alkaline Phosphatase: 197 U/L — ABNORMAL HIGH (ref 38–126)
Anion gap: 12 (ref 5–15)
BUN: 29 mg/dL — ABNORMAL HIGH (ref 8–23)
CO2: 32 mmol/L (ref 22–32)
Calcium: 9.3 mg/dL (ref 8.9–10.3)
Chloride: 89 mmol/L — ABNORMAL LOW (ref 98–111)
Creatinine, Ser: 0.88 mg/dL (ref 0.61–1.24)
GFR, Estimated: >60 mL/min (ref >60)
Glucose, Bld: 186 mg/dL — ABNORMAL HIGH (ref 70–99)
Potassium: 4.9 mmol/L (ref 3.5–5.1)
Sodium: 133 mmol/L — ABNORMAL LOW (ref 135–145)
Total Bilirubin: 0.6 mg/dL (ref 0.0–1.2)
Total Protein: 8.2 g/dL — ABNORMAL HIGH (ref 6.5–8.1)

## 2023-09-25 LAB — RESP PANEL BY RT-PCR (RSV, FLU A&B, COVID)  RVPGX2
Influenza A by PCR: NEGATIVE
Influenza B by PCR: NEGATIVE
Resp Syncytial Virus by PCR: NEGATIVE
SARS Coronavirus 2 by RT PCR: NEGATIVE

## 2023-09-25 LAB — I-STAT CG4 LACTIC ACID, ED: Lactic Acid, Venous: 1.2 mmol/L (ref 0.5–1.9)

## 2023-09-25 LAB — BRAIN NATRIURETIC PEPTIDE: B Natriuretic Peptide: 106.5 pg/mL — ABNORMAL HIGH (ref 0.0–100.0)

## 2023-09-25 LAB — MAGNESIUM: Magnesium: 2.1 mg/dL (ref 1.7–2.4)

## 2023-09-25 MED ORDER — ONDANSETRON HCL 4 MG PO TABS: 4.0000 mg | ORAL_TABLET | Freq: Four times a day (QID) | ORAL | Status: DC | PRN

## 2023-09-25 MED ORDER — LACTATED RINGERS IV BOLUS (SEPSIS)
2000.0000 mL | Freq: Once | INTRAVENOUS | Status: AC
  Administered 2023-09-25: 2000 mL via INTRAVENOUS

## 2023-09-25 MED ORDER — ONDANSETRON HCL 4 MG/2ML IJ SOLN: 4.0000 mg | Freq: Four times a day (QID) | INTRAMUSCULAR | Status: DC | PRN

## 2023-09-25 MED ORDER — LACTATED RINGERS IV SOLN
150.0000 mL/h | INTRAVENOUS | Status: DC
  Administered 2023-09-26: 150 mL/h via INTRAVENOUS

## 2023-09-25 MED ORDER — SODIUM CHLORIDE 0.9 % IV SOLN
500.0000 mg | INTRAVENOUS | Status: DC
  Administered 2023-09-26: 500 mg via INTRAVENOUS
  Filled 2023-09-25: qty 5

## 2023-09-25 MED ORDER — ACETAMINOPHEN 650 MG RE SUPP: 650.0000 mg | Freq: Four times a day (QID) | RECTAL | Status: DC | PRN

## 2023-09-25 MED ORDER — KETOROLAC TROMETHAMINE 15 MG/ML IJ SOLN
15.0000 mg | Freq: Four times a day (QID) | INTRAMUSCULAR | Status: DC | PRN
  Administered 2023-09-26 (×2): 15 mg via INTRAVENOUS
  Filled 2023-09-25 (×2): qty 1

## 2023-09-25 MED ORDER — ENOXAPARIN SODIUM 40 MG/0.4ML IJ SOSY
40.0000 mg | PREFILLED_SYRINGE | INTRAMUSCULAR | Status: DC
  Administered 2023-09-26 – 2023-10-03 (×8): 40 mg via SUBCUTANEOUS
  Filled 2023-09-25 (×8): qty 0.4

## 2023-09-25 MED ORDER — LACTATED RINGERS IV SOLN: INTRAVENOUS | Status: DC

## 2023-09-25 MED ORDER — ACETAMINOPHEN 325 MG PO TABS
650.0000 mg | ORAL_TABLET | Freq: Four times a day (QID) | ORAL | Status: DC | PRN
  Administered 2023-09-26: 650 mg via ORAL
  Filled 2023-09-25: qty 2

## 2023-09-25 MED ORDER — SODIUM CHLORIDE 0.9 % IV SOLN
2.0000 g | Freq: Once | INTRAVENOUS | Status: AC
  Administered 2023-09-25: 2 g via INTRAVENOUS
  Filled 2023-09-25: qty 12.5

## 2023-09-25 MED ORDER — MORPHINE SULFATE (PF) 4 MG/ML IV SOLN
4.0000 mg | Freq: Once | INTRAVENOUS | Status: AC
  Administered 2023-09-25: 4 mg via INTRAVENOUS
  Filled 2023-09-25: qty 1

## 2023-09-25 MED ORDER — SODIUM CHLORIDE 0.9 % IV SOLN
2.0000 g | INTRAVENOUS | Status: DC
  Administered 2023-09-26: 2 g via INTRAVENOUS
  Filled 2023-09-25: qty 20

## 2023-09-25 MED ORDER — VANCOMYCIN HCL 1750 MG/350ML IV SOLN
1750.0000 mg | Freq: Once | INTRAVENOUS | Status: AC
Start: 2023-09-25 — End: 2023-09-25
  Administered 2023-09-25: 1750 mg via INTRAVENOUS
  Filled 2023-09-25: qty 350

## 2023-09-25 MED ORDER — INSULIN ASPART 100 UNIT/ML IJ SOLN: 0.0000 [IU] | Freq: Three times a day (TID) | INTRAMUSCULAR | Status: DC

## 2023-09-25 MED ORDER — INSULIN ASPART 100 UNIT/ML IJ SOLN: 0.0000 [IU] | Freq: Every day | INTRAMUSCULAR | Status: DC

## 2023-09-25 MED ORDER — ACETAMINOPHEN 325 MG PO TABS
650.0000 mg | ORAL_TABLET | Freq: Once | ORAL | Status: AC
  Administered 2023-09-25: 650 mg
  Filled 2023-09-25: qty 2

## 2023-09-25 MED ORDER — VANCOMYCIN HCL IN DEXTROSE 1-5 GM/200ML-% IV SOLN
1000.0000 mg | Freq: Once | INTRAVENOUS | Status: DC
  Filled 2023-09-25: qty 200

## 2023-09-25 MED ORDER — METRONIDAZOLE 500 MG/100ML IV SOLN
500.0000 mg | Freq: Once | INTRAVENOUS | Status: AC
  Administered 2023-09-25: 500 mg via INTRAVENOUS
  Filled 2023-09-25: qty 100

## 2023-09-25 NOTE — ED Provider Notes (Signed)
  Physical Exam  BP 125/77 (BP Location: Left Arm)   Pulse (!) 111   Temp 98.2 F (36.8 C) (Oral)   Resp 20   SpO2 92%   Physical Exam  Procedures  Procedures  ED Course / MDM    Medical Decision Making Amount and/or Complexity of Data Reviewed Labs: ordered. Radiology: ordered.  Risk OTC drugs. Prescription drug management.   I, Evelena Hines, assumed care for this patient.  In brief this is a 68 year old male who was signed out to me pending CT imaging.  Patient arrived in the ED hypoxic with concerns for sepsis.  Patient has a history of head neck squamous cell carcinoma, peritracheal mass, has a tracheostomy tube.  He lives in skilled nursing facility.  Patient CT imaging the chest shows multifocal pneumonia.  Also shows some questionable infection in his neck.  Patient received broad-spectrum antibiotics.  Patient had some mild tenderness in his neck.  Does appear to be improving.  Have ordered dedicated films of the patient's neck.  Regardless, patient will require admission for his multifocal pneumonia and sepsis.  Will admit to hospitalist.       Afton Horse T, DO 09/25/23 2053

## 2023-09-25 NOTE — ED Provider Notes (Signed)
 [place] EMERGENCY DEPARTMENT AT [facility] Provider Note   CSN: [id number] Arrival date & time: 09/25/23  1336     History  Chief Complaint  Patient presents with   Respiratory Distress    Jesus [name] is a 68 y.o. male.  HPI Patient presents for shortness of breath.  Medical history includes DLBCL, anemia, HTN, laryngeal cancer, DM, GERD, HLD, COPD, CHF.  He came to cancer center today from [facility] for cancer treatment.  Per chart review, this includes carbo/Taxol.  When he arrived, patient was tachycardic and tachypneic.  He was noted to be hypoxic on his baseline supplemental oxygen.  Patient currently denies any pain.  History is limited by presence of tracheostomy.    Home Medications Prior to Admission medications   Medication Sig Start Date End Date Taking? Authorizing Provider  acetaminophen  (TYLENOL ) 160 MG/5ML solution Place 20.3 mLs (650 mg total) into feeding tube every 6 (six) hours as needed for mild pain (pain score 1-3). 06/20/23  Yes Maczis, Michael M, PA-C  CHEST CONGESTION RELIEF 100 MG/5ML liquid Place 200 mg into feeding tube every 3 (three) hours as needed for cough or to loosen phlegm.   Yes [provider]  fentaNYL  (DURAGESIC ) 50 MCG/HR Place 1 patch onto the skin every other day.   Yes [provider]  ferrous sulfate  220 (44 Fe) MG/5ML solution Place 6.8 mLs (300 mg total) into feeding tube every other day. Patient taking differently: Place 300 mg into feeding tube daily with breakfast. 07/17/23  Yes Danford, Willis Harter, MD  gabapentin  (NEURONTIN ) 300 MG capsule Place 300 mg into feeding tube 2 (two) times daily.   Yes [provider]  glycopyrrolate  (ROBINUL ) 1 MG tablet Place 1 tablet (1 mg total) into feeding tube every other day. Crush tablet, place in water , and then place in feeding tube. Patient taking differently: Place 1 mg into feeding tube See admin instructions. Crush one 1 mg tablet, place in water ,  and into the feeding tube- ONCE A DAY AS NEEDED FOR EXCESS SECRETIONS 07/17/23  Yes Danford, Willis Harter, MD  hydrOXYzine  (ATARAX ) 25 MG tablet Place 25 mg into feeding tube every 6 (six) hours as needed for anxiety. 09/13/23  Yes [provider]  ipratropium-albuterol  (DUONEB) 0.5-2.5 (3) MG/3ML SOLN Take 3 mLs by nebulization every 6 (six) hours. 3 ml's, via nebulizer, every 6 hours and as needed for shortness of breath or wheezing 09/13/23  Yes [provider]  melatonin 3 MG TABS tablet Place 6 mg into feeding tube at bedtime. 09/13/23  Yes [provider]  Menthol-Zinc  Oxide (CALMOSEPTINE) 0.44-20.6 % OINT Apply 1 application  topically See admin instructions. Apply a thin layer to sacral region for fragile skin- between incontinent episodes   Yes [provider]  Nutritional Supplements (FEEDING SUPPLEMENT, GLUCERNA 1.5 CAL,) LIQD Place 355 mLs into feeding tube See admin instructions. "355 ml's, per tube, four times a day with FWF 30 ml's before and after each bolus and an additional 360 ml's water  three times a day for hydration needs (Total for day: 1,420 ml's/2,130 kCal)"   Yes [provider]  oxyCODONE  (ROXICODONE ) 5 MG/5ML solution Place 5 mLs (5 mg total) into feeding tube every 6 (six) hours as needed for breakthrough pain. Patient taking differently: Place 10 mg into feeding tube every 4 (four) hours as needed (for pain). 06/20/23  Yes Maczis, Michael M, PA-C  pantoprazole  sodium (PROTONIX ) 40 mg Place 40 mg into feeding tube daily.  Yes [provider]  polyethylene glycol (MIRALAX  / GLYCOLAX ) 17 g packet Place 17 g into feeding tube See admin instructions. Dissolve 17 grams of powder in 4-6 ounces of water  and administer, via tube, twice a day 09/13/23  Yes [provider]  sennosides (SENOKOT) 8.8 MG/5ML syrup Place 8.8 mg into feeding tube daily. 09/13/23  Yes [provider]  albuterol  (VENTOLIN  HFA) 108 (90  Base) MCG/ACT inhaler Inhale 2 puffs into the lungs every 6 (six) hours as needed for wheezing or shortness of breath. Patient not taking: Reported on 09/25/2023 07/05/23   Aisha Hove, MD  Blood Glucose Monitoring Suppl DEVI 1 each by Does not apply route in the morning, at noon, and at bedtime. May substitute to any manufacturer covered by patient's insurance. 07/31/23   Wouk, Haynes Lips, MD  insulin  glargine (LANTUS ) 100 UNIT/ML Solostar Pen Inject 15 Units into the skin at bedtime. Patient not taking: Reported on 09/22/2023 07/31/23   Wouk, Haynes Lips, MD  Insulin  Pen Needle (PEN NEEDLE/5-BEVEL TIP) 32G X 4 MM MISC 1 each by Does not apply route at bedtime. 07/31/23   Wouk, Haynes Lips, MD  Nutritional Supplements (NUTREN 1.5) LIQD Give 2 cartons 4 times a day.  Flush with 30ml of water  before and after each feeding.  Give additional 360ml of water  TID for additional hydration. Patient not taking: Reported on 09/25/2023 06/26/23   Frankie Israel, MD  Protein (FEEDING SUPPLEMENT, PROSOURCE TF20,) liquid Place 60 mLs into feeding tube daily. Patient not taking: Reported on 09/25/2023 07/06/23   Aisha Hove, MD      Allergies    Bee venom, Lisinopril , Antifungal [miconazole nitrate], and Zolpidem tartrate er    Review of Systems   Review of Systems  Unable to perform ROS: Patient nonverbal    Physical Exam Updated Vital Signs BP 100/75   Pulse (!) 131   Temp (!) 100.4 F (38 C) (Oral)   Resp 20   SpO2 95%  Physical Exam Vitals and nursing note reviewed.  Constitutional:      General: He is not in acute distress.    Appearance: Normal appearance. He is well-developed. He is not ill-appearing, toxic-appearing or diaphoretic.  HENT:     Head: Normocephalic and atraumatic.     Right Ear: External ear normal.     Left Ear: External ear normal.     Nose: Nose normal.     Mouth/Throat:     Mouth: Mucous membranes are moist.  Eyes:     Extraocular Movements:  Extraocular movements intact.     Conjunctiva/sclera: Conjunctivae normal.  Cardiovascular:     Rate and Rhythm: Regular rhythm. Tachycardia present.  Pulmonary:     Effort: Pulmonary effort is normal. Tachypnea present. No respiratory distress.     Breath sounds: Rhonchi and rales present.  Abdominal:     General: There is no distension.     Palpations: Abdomen is soft.     Tenderness: There is no abdominal tenderness.  Musculoskeletal:        General: No swelling.     Cervical back: Normal range of motion and neck supple.  Skin:    General: Skin is warm and dry.  Neurological:     General: No focal deficit present.     Mental Status: He is alert.  Psychiatric:        Mood and Affect: Mood normal.        Behavior: Behavior normal.     ED Results /  Procedures / Treatments   Labs (all labs ordered are listed, but only abnormal results are displayed) Labs Reviewed  COMPREHENSIVE METABOLIC PANEL WITH GFR - Abnormal; Notable for the following components:      Result Value   Sodium 133 (*)    Chloride 89 (*)    Glucose, Bld 186 (*)    BUN 29 (*)    Total Protein 8.2 (*)    Albumin 2.7 (*)    Alkaline Phosphatase 197 (*)    All other components within normal limits  CBC WITH DIFFERENTIAL/PLATELET - Abnormal; Notable for the following components:   WBC 19.7 (*)    RBC 3.25 (*)    Hemoglobin 8.5 (*)    HCT 28.3 (*)    RDW 17.8 (*)    Platelets 458 (*)    Neutro Abs 16.7 (*)    Monocytes Absolute 1.2 (*)    Abs Immature Granulocytes 0.13 (*)    All other components within normal limits  PROTIME-INR - Abnormal; Notable for the following components:   Prothrombin Time 15.4 (*)    All other components within normal limits  URINALYSIS, W/ REFLEX TO CULTURE (INFECTION SUSPECTED) - Abnormal; Notable for the following components:   Protein, ur 30 (*)    All other components within normal limits  BRAIN NATRIURETIC PEPTIDE - Abnormal; Notable for the following components:   B  Natriuretic Peptide 106.5 (*)    All other components within normal limits  RESP PANEL BY RT-PCR (RSV, FLU A&B, COVID)  RVPGX2  CULTURE, BLOOD (ROUTINE X 2)  CULTURE, BLOOD (ROUTINE X 2)  MAGNESIUM   I-STAT CG4 LACTIC ACID, ED  I-STAT CG4 LACTIC ACID, ED    EKG EKG Interpretation Date/Time:  Monday Sep 25 2023 13:46:38 EDT Ventricular Rate:  133 PR Interval:  93 QRS Duration:  152 QT Interval:  338 QTC Calculation: 503 R Axis:   264  Text Interpretation: Sinus or ectopic atrial tachycardia Atrial premature complex Right bundle branch block Anterior infarct, old Confirmed by Iva Mariner [phone]) on 09/25/2023 2:30:20 PM  Radiology DG Chest Port 1 View Result Date: 09/25/2023 CLINICAL DATA:  Questionable sepsis. EXAM: PORTABLE CHEST 1 VIEW COMPARISON:  Chest radiograph dated 07/29/2023. FINDINGS: Right-sided Port-A-Cath with tip at the cavoatrial junction. Shallow inspiration with bibasilar atelectasis. No pleural effusion pneumothorax. The cardiac silhouette is within limits. Tracheostomy approximately 5 cm above the carina. No acute osseous pathology. IMPRESSION: Shallow inspiration with bibasilar atelectasis. Electronically Signed   By: Angus Bark M.D.   On: 09/25/2023 15:34    Procedures Procedures    Medications Ordered in ED Medications  lactated ringers  infusion ( Intravenous New Bag/Given 09/25/23 1459)  lactated ringers  bolus 2,000 mL (2,000 mLs Intravenous New Bag/Given 09/25/23 1407)  ceFEPIme  (MAXIPIME ) 2 g in sodium chloride  0.9 % 100 mL IVPB (0 g Intravenous Stopped 09/25/23 1459)  metroNIDAZOLE  (FLAGYL ) IVPB 500 mg (500 mg Intravenous New Bag/Given 09/25/23 1409)  acetaminophen  (TYLENOL ) tablet 650 mg (650 mg Per Tube Given 09/25/23 1547)  vancomycin  (VANCOREADY) IVPB 1750 mg/350 mL (1,750 mg Intravenous New Bag/Given 09/25/23 1416)    ED Course/ Medical Decision Making/ A&P                                 Medical Decision Making Amount and/or Complexity of Data  Reviewed Labs: ordered. Radiology: ordered.  Risk OTC drugs. Prescription drug management.   This patient presents to the ED for  concern of shortness of breath, this involves an extensive number of treatment options, and is a complaint that carries with it a high risk of complications and morbidity.  The differential diagnosis includes pneumonia, pulmonary edema, reactive airway disease, acidosis, anemia, other metabolic derangements, medication side effects   Co morbidities that complicate the patient evaluation  DLBCL, anemia, HTN, laryngeal cancer, DM, GERD, HLD, COPD, CHF   Additional history obtained:  Additional history obtained from N/A External records from outside source obtained and reviewed including EMR   Lab Tests:  I Ordered, and personally interpreted labs.  The pertinent results include: Leukocytosis is present.  Anemia is baseline.  Kidney function is preserved.  Initial lactate is normal.   Imaging Studies ordered:  I ordered imaging studies including chest x-ray, CT of chest, abdomen, pelvis I independently visualized and interpreted imaging which showed bibasilar atelectasis on x-ray.  CT imaging pending at time of signout. I agree with the radiologist interpretation   Cardiac Monitoring: / EKG:  The patient was maintained on a cardiac monitor.  I personally viewed and interpreted the cardiac monitored which showed an underlying rhythm of: Sinus rhythm   Problem List / ED Course / Critical interventions / Medication management  Patient presenting for increased work of breathing.  This was identified at cancer center, where he went this morning for chemotherapy treatment.  On arrival in the ED, patient is tachycardic and tachypneic.  He has mildly increased work of breathing.  Although there was report of hypoxia on 15 L of blow-by oxygen, patient is currently normal SpO2 on 12 L.  Although he is nonverbal, he can answer yes or no questions.  He denies any  areas of pain.  Abdomen is soft and nontender.  While in the room, patient was witnessed having a coughing spell.  He had purulent sputum from trach.  I suspect pneumonia.  Septic workup and treatment were initiated.  Lab work notable for leukocytosis.  CT imaging pending at time of signout.  Care of patient was signed to oncoming ED provider. I ordered medication including IV fluids and broad-spectrum antibiotics for empiric treatment of sepsis; Tylenol  for antipyresis Reevaluation of the patient after these medicines showed that the patient improved I have reviewed the patients home medicines and have made adjustments as needed  Social Determinants of Health:  Resides in [facility] long-term care facility  CRITICAL CARE Performed by: Iva Mariner   Total critical care time: 33 minutes  Critical care time was exclusive of separately billable procedures and treating other patients.  Critical care was necessary to treat or prevent imminent or life-threatening deterioration.  Critical care was time spent personally by me on the following activities: development of treatment plan with patient and/or surrogate as well as nursing, discussions with consultants, evaluation of patient's response to treatment, examination of patient, obtaining history from patient or surrogate, ordering and performing treatments and interventions, ordering and review of laboratory studies, ordering and review of radiographic studies, pulse oximetry and re-evaluation of patient's condition.        Final Clinical Impression(s) / ED Diagnoses Final diagnoses:  Sepsis, due to unspecified organism, unspecified whether acute organ dysfunction present [facility])    Rx / DC Orders ED Discharge Orders     None         Iva Mariner, MD 09/25/23 [phone]

## 2023-09-25 NOTE — ED Notes (Signed)
 Second attempt at second set of blood culture unsuccessful.

## 2023-09-25 NOTE — Sepsis Progress Note (Signed)
 Following for sepsis monitoring ?

## 2023-09-25 NOTE — Progress Notes (Signed)
 DISCONTINUE ON PATHWAY REGIMEN - Head and Neck     A cycle is every 21 days:     Carboplatin       Fluorouracil       Pembrolizumab    **Always confirm dose/schedule in your pharmacy ordering system**  PRIOR TREATMENT: [id number]: Pembrolizumab  200 mg D1 + Carboplatin  AUC=5 D1 + Fluorouracil  1,000 mg/m2/day CIV D1-4 q21 Days x 6 Cycles, then Pembrolizumab  200 mg q21 Days for up to a Total of 24 Months  START ON PATHWAY REGIMEN - Head and Neck     Cycles 1 through 6: A cycle is every 21 days:     Pembrolizumab       Paclitaxel      Carboplatin     Cycles 7 and beyond: A cycle is every 21 days:     Pembrolizumab    **Always confirm dose/schedule in your pharmacy ordering system**  Patient Characteristics: Larynx, Metastatic, First Line, No Prior Platinum-Based Chemoradiation within 6 Months, PD-L1 Expression Positive (CPS ? 1) Disease Classification: Larynx AJCC T Category: [place] AJCC 8 Stage Grouping: IVC Therapeutic Status: Metastatic Disease AJCC N Category: pN2 AJCC M Category: M1 Line of Therapy: First Line Prior Platinum Status: No Prior Platinum-Based Chemoradiation within 6 Months PD-L1 Expression Status: PD-L1 Positive (CPS ? 1) Intent of Therapy: Non-Curative / Palliative Intent, Discussed with Patient

## 2023-09-25 NOTE — Addendum Note (Signed)
 Addended by: Jacquelyn Matt on: 09/25/2023 01:32 AM   Modules accepted: Orders, Level of Service

## 2023-09-25 NOTE — Progress Notes (Signed)
 Called by RN to evaluate pt in the lobby. Briefly this is a 68 yo male presenting via [facility] from [facility] for chemotherapy treatment including carboplatin  and Taxol.  Patient is followed by Dr. Salomon Cree.  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  While at [facility] patient underwent emergent tracheostomy and thoracic surgery.  Patient did receive his first round of chemo while at [facility].  He was discharged to [facility] for further care starting on 09/14/2023.  On my arrival patient is on 10 L with oxygen saturation in the 90s and tachycardic to 140.  Patient was hypoxic to 83 on 6 L on arrival. He was also noted to be hypertensive 157/109.  Patient shakes his head no when asked if he has any shortness of breath or chest pain.  His Port-A-Cath was already accessed prior to arrival.   Patient transported to the ED for emergent evaluation. Report given to accepting RN and EDP.   I have spent a total of 20 minutes minutes of face-to-face and non-face-to-face time preparing to see the patient, performing a medically appropriate examination, ordering tests/procedures/medications, documenting clinical information in the electronic health record, and care coordination.

## 2023-09-25 NOTE — ED Notes (Signed)
 ED TO INPATIENT HANDOFF REPORT  Name/Age/Gender Jesus [name] 68 y.o. male  Code Status Code Status History     Date Active Date Inactive Code Status Order ID Comments User Context   07/29/2023 0830 07/31/2023 2300 Full Code [id number]  Danice Dural, MD ED   07/15/2023 0249 07/17/2023 2249 Full Code [id number]  Roxana Copier, DO ED   07/02/2023 2323 07/05/2023 1755 Full Code [id number]  Howerter, Gattis Kass, DO ED   06/16/2023 0122 06/24/2023 1813 Full Code [id number]  Corrinne Din, MD ED   06/16/2023 0119 06/16/2023 0122 Full Code [id number]  Corrinne Din, MD ED   06/16/2023 0119 06/16/2023 0119 Limited: Do not attempt resuscitation (DNR) -DNR-LIMITED -Do Not Intubate/DNI  [id number]  Corrinne Din, MD ED   08/10/2022 1620 08/11/2022 0514 Full Code [id number]  Marland Silvas, MD HOV   05/28/2018 1307 06/01/2018 1637 Full Code [id number]  Frankie Israel, MD Inpatient   05/10/2018 1426 05/14/2018 1559 Full Code [id number]  Frankie Israel, MD Inpatient   04/17/2018 0040 04/20/2018 1907 Full Code [id number]  Fidencio Hue, MD Inpatient   04/16/2018 1531 04/17/2018 0039 Full Code [id number]  Frankie Israel, MD Inpatient   03/27/2018 1252 03/31/2018 1820 Full Code [id number]  Frankie Israel, MD Inpatient   02/26/2018 1030 03/02/2018 1650 Full Code [id number]  Frankie Israel, MD Inpatient   02/05/2018 1100 02/09/2018 1607 Full Code [id number]  Frankie Israel, MD Inpatient   01/15/2018 1126 01/19/2018 2134 Full Code [id number]  Frankie Israel, MD Inpatient    Questions for Most Recent Historical Code Status (Order [id number])     Question Answer   By: Consent: discussion documented in EHR            Home/SNF/Other Skilled nursing facility  Chief Complaint Pneumonia [J18.9]  Level of Care/Admitting Diagnosis ED Disposition     ED Disposition  Admit   Condition  --   Comment  Hospital Area: [facility] [[id number]]  Level of Care: Telemetry [5]  Admit  to tele based on following criteria: Other see comments  Comments: Hypoxia  May admit patient to [facility] or [facility] if equivalent level of care is available:: Yes  Covid Evaluation: Confirmed COVID Negative  Diagnosis: Pneumonia [893810]  Admitting Physician: Davida Espy [[id number]]  Attending Physician: Davida Espy [[id number]]  Certification:: I certify this patient will need inpatient services for at least 2 midnights  Expected Medical Readiness: 09/29/2023          Medical History 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- last radiation 06-2018   Cataract    removed both eyes with l;ens implants    COPD (chronic obstructive pulmonary disease) (HCC)    Dyspnea    Family history of colon cancer    in his brother- dx'd age 63    History of chemotherapy    last 06-01-2018   History of kidney stones    History of radiation therapy    last radiation 06-2018   Hyperlipidemia    currently under control   Hypertension    Irregular heart beats    Lymphadenopathy    Pain, lower leg    Bilateral   Peripheral arterial disease (HCC)  Pre-diabetes    Red-green color blindness    RLS (restless legs syndrome)    Snores    Wears glasses     Allergies Allergies  Allergen Reactions   Bee Venom Anaphylaxis   Lisinopril  Swelling and Other (See Comments)    Lips became very swollen- "Allergic," per [facility]   Antifungal [Miconazole Nitrate] Other (See Comments)    Headaches- "Allergic," per [facility]   Zolpidem Tartrate Er Other (See Comments)    Leg cramps    IV Location/Drains/Wounds Patient Lines/Drains/Airways Status     Active Line/Drains/Airways     Name Placement date Placement time Site Days   Implanted Port 08/10/22 Right Chest 08/10/22  1547  Chest  411   Peripheral IV  09/25/23 20 G Right Antecubital 09/25/23  1348  Antecubital  less than 1   Peripheral IV 09/25/23 Left;Posterior Hand 09/25/23  1542  Hand  less than 1   Gastrostomy/Enterostomy Gastrostomy 22 Fr. LUQ 06/21/23  1610  LUQ  96   Tracheostomy Shiley XLT Proximal 6 mm Uncuffed --  --  6 mm  --            Labs/Imaging Results for orders placed or performed during the hospital encounter of 09/25/23 (from the past 48 hours)  Comprehensive metabolic panel     Status: Abnormal   Collection Time: 09/25/23  1:52 PM  Result Value Ref Range   Sodium 133 (L) 135 - 145 mmol/L   Potassium 4.9 3.5 - 5.1 mmol/L   Chloride 89 (L) 98 - 111 mmol/L   CO2 32 22 - 32 mmol/L   Glucose, Bld 186 (H) 70 - 99 mg/dL    Comment: Glucose reference range applies only to samples taken after fasting for at least 8 hours.   BUN 29 (H) 8 - 23 mg/dL   Creatinine, Ser 8.84 0.61 - 1.24 mg/dL   Calcium  9.3 8.9 - 10.3 mg/dL   Total Protein 8.2 (H) 6.5 - 8.1 g/dL   Albumin 2.7 (L) 3.5 - 5.0 g/dL   AST 15 15 - 41 U/L   ALT 14 0 - 44 U/L   Alkaline Phosphatase 197 (H) 38 - 126 U/L   Total Bilirubin 0.6 0.0 - 1.2 mg/dL   GFR, Estimated >16 >60 mL/min    Comment: (NOTE) Calculated using the CKD-EPI Creatinine Equation (2021)    Anion gap 12 5 - 15    Comment: Performed at [facility], [address], [place], [place] [address]  CBC with Differential     Status: Abnormal   Collection Time: 09/25/23  1:52 PM  Result Value Ref Range   WBC 19.7 (H) 4.0 - 10.5 K/uL   RBC 3.25 (L) 4.22 - 5.81 MIL/uL   Hemoglobin 8.5 (L) 13.0 - 17.0 g/dL   HCT 01.0 (L) 93.2 - 35.5 %   MCV 87.1 80.0 - 100.0 fL   MCH 26.2 26.0 - 34.0 pg   MCHC 30.0 30.0 - 36.0 g/dL   RDW 73.2 (H) 20.2 - 54.2 %   Platelets 458 (H) 150 - 400 K/uL   nRBC 0.0 0.0 - 0.2 %   Neutrophils Relative % 84 %   Neutro Abs 16.7 (H) 1.7 - 7.7 K/uL   Lymphocytes Relative 9 %   Lymphs Abs 1.7 0.7 - 4.0 K/uL   Monocytes Relative 6 %   Monocytes  Absolute 1.2 (H) 0.1 - 1.0 K/uL   Eosinophils Relative 0 %   Eosinophils Absolute 0.0 0.0 - 0.5  K/uL   Basophils Relative 0 %   Basophils Absolute 0.1 0.0 - 0.1 K/uL   Immature Granulocytes 1 %   Abs Immature Granulocytes 0.13 (H) 0.00 - 0.07 K/uL    Comment: Performed at [facility], [address], [place], [place] [address]  Protime-INR     Status: Abnormal   Collection Time: 09/25/23  1:52 PM  Result Value Ref Range   Prothrombin Time 15.4 (H) 11.4 - 15.2 seconds   INR 1.2 0.8 - 1.2    Comment: (NOTE) INR goal varies based on device and disease states. Performed at [facility], [address], [place], [place] [address]   Magnesium      Status: None   Collection Time: 09/25/23  1:52 PM  Result Value Ref Range   Magnesium  2.1 1.7 - 2.4 mg/dL    Comment: Performed at [facility], [address] [place], [place] [address]  Brain natriuretic peptide     Status: Abnormal   Collection Time: 09/25/23  1:52 PM  Result Value Ref Range   B Natriuretic Peptide 106.5 (H) 0.0 - 100.0 pg/mL    Comment: Performed at [facility], [address] [place], [place] [address]  I-Stat Lactic Acid, ED     Status: None   Collection Time: 09/25/23  1:59 PM  Result Value Ref Range   Lactic Acid, Venous 1.2 0.5 - 1.9 mmol/L  Resp panel by RT-PCR (RSV, Flu A&B, Covid) Anterior Nasal Swab     Status: None   Collection Time: 09/25/23  3:02 PM   Specimen: Anterior Nasal Swab  Result Value Ref Range   SARS Coronavirus 2 by RT PCR NEGATIVE NEGATIVE    Comment: (NOTE) SARS-CoV-2 target nucleic acids are NOT DETECTED.  The SARS-CoV-2 RNA is generally detectable in upper respiratory specimens during the acute phase of infection. The lowest concentration of SARS-CoV-2 viral copies this assay can detect is 138 copies/mL. A negative result does not preclude SARS-Cov-2 infection and should not be used as the sole basis for  treatment or other patient management decisions. A negative result may occur with  improper specimen collection/handling, submission of specimen other than nasopharyngeal swab, presence of viral mutation(s) within the areas targeted by this assay, and inadequate number of viral copies(<138 copies/mL). A negative result must be combined with clinical observations, patient history, and epidemiological information. The expected result is Negative.  Fact Sheet for Patients:  BloggerCourse.com  Fact Sheet for Healthcare Providers:  SeriousBroker.it  This test is no t yet approved or cleared by the [place]  FDA and  has been authorized for detection and/or diagnosis of SARS-CoV-2 by FDA under an Emergency Use Authorization (EUA). This EUA will remain  in effect (meaning this test can be used) for the duration of the COVID-19 declaration under Section 564(b)(1) of the Act, 21 U.S.C.section 360bbb-3(b)(1), unless the authorization is terminated  or revoked sooner.       Influenza A by PCR NEGATIVE NEGATIVE   Influenza B by PCR NEGATIVE NEGATIVE    Comment: (NOTE) The Xpert Xpress SARS-CoV-2/FLU/RSV plus assay is intended as an aid in the diagnosis of influenza from Nasopharyngeal swab specimens and should not be used as a sole basis for treatment. Nasal washings and aspirates are unacceptable for Xpert Xpress SARS-CoV-2/FLU/RSV testing.  Fact Sheet for Patients: BloggerCourse.com  Fact Sheet for Healthcare Providers: SeriousBroker.it  This test is not yet approved or cleared by the [place]  FDA and has been  authorized for detection and/or diagnosis of SARS-CoV-2 by FDA under an Emergency Use Authorization (EUA). This EUA will remain in effect (meaning this test can be used) for the duration of the COVID-19 declaration under Section 564(b)(1) of the Act, 21 U.S.C. section  360bbb-3(b)(1), unless the authorization is terminated or revoked.     Resp Syncytial Virus by PCR NEGATIVE NEGATIVE    Comment: (NOTE) Fact Sheet for Patients: BloggerCourse.com  Fact Sheet for Healthcare Providers: SeriousBroker.it  This test is not yet approved or cleared by the [place]  FDA and has been authorized for detection and/or diagnosis of SARS-CoV-2 by FDA under an Emergency Use Authorization (EUA). This EUA will remain in effect (meaning this test can be used) for the duration of the COVID-19 declaration under Section 564(b)(1) of the Act, 21 U.S.C. section 360bbb-3(b)(1), unless the authorization is terminated or revoked.  Performed at [facility], [address], [place], [place] [address]   Blood Culture (routine x 2)     Status: None (Preliminary result)   Collection Time: 09/25/23  3:33 PM   Specimen: BLOOD LEFT HAND  Result Value Ref Range   Specimen Description      BLOOD LEFT HAND Performed at [facility], [address] [place], [place] [address]    Special Requests      BOTTLES DRAWN AEROBIC AND ANAEROBIC Blood Culture results may not be optimal due to an inadequate volume of blood received in culture bottles Performed at [facility], [address], [place], [place] [address]    Culture PENDING    Report Status PENDING   Urinalysis, w/ Reflex to Culture (Infection Suspected) -Urine, Clean Catch     Status: Abnormal   Collection Time: 09/25/23  3:43 PM  Result Value Ref Range   Specimen Source URINE, CLEAN CATCH    Color, Urine YELLOW YELLOW   APPearance CLEAR CLEAR   Specific Gravity, Urine 1.015 1.005 - 1.030   pH 8.0 5.0 - 8.0   Glucose, UA NEGATIVE NEGATIVE mg/dL   Hgb urine dipstick NEGATIVE NEGATIVE   Bilirubin Urine NEGATIVE NEGATIVE   Ketones, ur NEGATIVE NEGATIVE mg/dL   Protein, ur 30 (A) NEGATIVE mg/dL   Nitrite NEGATIVE NEGATIVE    Leukocytes,Ua NEGATIVE NEGATIVE   RBC / HPF 0-5 0 - 5 RBC/hpf   WBC, UA 0-5 0 - 5 WBC/hpf    Comment:        Reflex urine culture not performed if WBC <=10, OR if Squamous epithelial cells >5. If Squamous epithelial cells >5 suggest recollection.    Bacteria, UA NONE SEEN NONE SEEN   Squamous Epithelial / HPF 0-5 0 - 5 /HPF    Comment: Performed at [facility], [address] [place], [place] [address]   CT CHEST ABDOMEN PELVIS WO CONTRAST Result Date: 09/25/2023 CLINICAL DATA:  Sepsis cancer EXAM: CT CHEST, ABDOMEN AND PELVIS WITHOUT CONTRAST TECHNIQUE: Multidetector CT imaging of the chest, abdomen and pelvis was performed following the standard protocol without IV contrast. RADIATION DOSE REDUCTION: This exam was performed according to the departmental dose-optimization program which includes automated exposure control, adjustment of the mA and/or kV according to patient size and/or use of iterative reconstruction technique. COMPARISON:  CT 08/03/2023, 07/15/2023, PET CT 04/14/2023 FINDINGS: CT CHEST FINDINGS Cardiovascular: Limited evaluation without intravenous contrast. Right-sided central venous port tip at the cavoatrial region. No significant pericardial effusion. Nonaneurysmal aorta Mediastinum/Nodes: Interim tracheostomy tube. Abnormal gas and debris surrounding the tracheostomy tube,  series 2, image 4 through 10. Tip of the tube terminates several cm above the carina. Tracheal deviation to the right from known mass lesion. Enlarged right paratracheal node measures 14 mm compared with 10 mm previously. Interval small volume fluid in the anterior mediastinum and generalized hazy appearance of the mediastinal fat compared to prior. Circumferential soft tissue mass/thickening at the included thoracic inlet surrounds the trachea. Mass appears slightly smaller compared with 07/15/2023 chest CT. Hazy density surrounding the distal trachea to the bifurcation and extends to the  subcarinal region. Lungs/Pleura: Dense bilateral lower lobe consolidations with air bronchograms. Adjacent heterogeneous consolidations and ground-glass densities. No pleural effusion. Musculoskeletal: No acute osseous abnormality CT ABDOMEN PELVIS FINDINGS Hepatobiliary: No focal liver abnormality is seen. No gallstones, gallbladder wall thickening, or biliary dilatation. Pancreas: Unremarkable. No pancreatic ductal dilatation or surrounding inflammatory changes. Spleen: Normal in size without focal abnormality. Adrenals/Urinary Tract: Adrenal glands are stable in appearance. Bilateral renal cysts for which no imaging follow-up is recommended. Punctate nonobstructing left kidney stone. No hydronephrosis. Nonspecific left greater than right perinephric stranding. The bladder is unremarkable Stomach/Bowel: The stomach contains a gastrostomy tube in the distal body. There is no dilated small bowel. No acute bowel wall thickening. Negative appendix. Vascular/Lymphatic: Aortic atherosclerosis. No enlarged abdominal or pelvic lymph nodes. Reproductive: Slightly enlarged prostate Other: Negative for pelvic effusion or free air. Small fat containing umbilical hernia. Musculoskeletal: Degenerative changes. No acute osseous abnormality. IMPRESSION: 1. Interim tracheostomy tube with abnormal appearance of gas and debris surrounding the tracheostomy tube. Findings could be secondary to focal perforation versus infection. Contrast-enhanced CT of the neck/upper chest could be obtained if patient able to tolerate. 2. Interval small volume fluid and stranding in the anterior mediastinum and generalized hazy appearance of the mediastinal fat compared to prior, which may be inflammatory or infectious in etiology. New dense bilateral lower lobe consolidations with air bronchograms, suspect for multifocal pneumonia and or aspiration. 3. Circumferential soft tissue mass/thickening at the included thoracic inlet surrounds the trachea.  Left paratracheal mass appears slightly smaller compared with 07/15/2023 chest CT, but there is slight interval enlargement of the right paratracheal node. Hazy density surrounding the distal trachea to the bifurcation and extends to the subcarinal region, difficult to assess without contrast, possible edema, infection, or additional soft tissue mass. 4. No CT evidence for acute intra-abdominal or pelvic abnormality. Punctate nonobstructing left kidney stone. Nonspecific left greater than right perinephric stranding, correlate with urinalysis. 5. Aortic atherosclerosis. Aortic Atherosclerosis (ICD10-I70.0). Electronically Signed   By: Esmeralda Hedge M.D.   On: 09/25/2023 20:25   DG Chest Port 1 View Result Date: 09/25/2023 CLINICAL DATA:  Questionable sepsis. EXAM: PORTABLE CHEST 1 VIEW COMPARISON:  Chest radiograph dated 07/29/2023. FINDINGS: Right-sided Port-A-Cath with tip at the cavoatrial junction. Shallow inspiration with bibasilar atelectasis. No pleural effusion pneumothorax. The cardiac silhouette is within limits. Tracheostomy approximately 5 cm above the carina. No acute osseous pathology. IMPRESSION: Shallow inspiration with bibasilar atelectasis. Electronically Signed   By: Angus Bark M.D.   On: 09/25/2023 15:34    Pending Labs Unresulted Labs (From admission, onward)     Start     Ordered   09/25/23 1348  Blood Culture (routine x 2)  (Septic presentation on arrival (screening labs, nursing and treatment orders for obvious sepsis))  BLOOD CULTURE X 2,   STAT      09/25/23 1349            Vitals/Pain Today's Vitals   09/25/23 1925 09/25/23 1936  09/25/23 1942 09/25/23 2046  BP:  125/77    Pulse: (!) 114 (!) 111    Resp:  18 20   Temp:  98.2 F (36.8 C)    TempSrc:  Oral    SpO2: 95% 94% 92%   PainSc:    8     Isolation Precautions No active isolations  Medications Medications  lactated ringers  infusion ( Intravenous New Bag/Given 09/25/23 1459)  lactated ringers   bolus 2,000 mL (0 mLs Intravenous Stopped 09/25/23 1854)  ceFEPIme  (MAXIPIME ) 2 g in sodium chloride  0.9 % 100 mL IVPB (0 g Intravenous Stopped 09/25/23 1459)  metroNIDAZOLE  (FLAGYL ) IVPB 500 mg (0 mg Intravenous Stopped 09/25/23 1854)  acetaminophen  (TYLENOL ) tablet 650 mg (650 mg Per Tube Given 09/25/23 1547)  vancomycin  (VANCOREADY) IVPB 1750 mg/350 mL (0 mg Intravenous Stopped 09/25/23 1854)  morphine  (PF) 4 MG/ML injection 4 mg (4 mg Intravenous Given 09/25/23 2046)    Mobility non-ambulatory

## 2023-09-25 NOTE — H&P (Signed)
 History and Physical    Patient: Jesus [name] WGN:[id number] DOB: 01-18-1956 DOA: 09/25/2023 DOS: the patient was seen and examined on 09/25/2023 PCP: [name], MD  Patient coming from: SNF  Chief Complaint:  Chief Complaint  Patient presents with   Respiratory Distress   HPI: Jesus [name] is a 68 y.o. male with medical history significant of diffuse large B cell lymphoma, anemia of chronic disease, essential hypertension, history of laryngeal cancer, GERD, COPD, diastolic CHF, hyperlipidemia, who is status post tracheostomy.  Currently on chemotherapy being followed by oncology who was at the cancer center today for his chemotherapy which included carboplatin  and Taxol.  He initially was at [facility] where he had the paratracheal mass compressing his airway.  That was following radiation.  He had emergent tracheostomy with thoracic surgery.  He was started on chemo there and then transferred to [facility].  On arrival at the cancer center patient was noted to have significant hypoxemia.  He is requiring 10 L of oxygen.  Also tachycardic with a heart rate of 140.  He was sent to the ER where he was initially on 6 L and had a temperature of 102.  Patient therefore has met sepsis criteria.  Further evaluation showed bilateral pneumonia.  At this point patient appears to have sepsis due to pneumonia and is being admitted for further evaluation and treatment.  Review of Systems: As mentioned in the history of present illness. All other systems reviewed and are negative. 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- last radiation 06-2018   Cataract    removed both eyes with l;ens implants    COPD (chronic obstructive pulmonary disease) (HCC)    Dyspnea    Family history of colon cancer    in his  brother- dx'd age 43    History of chemotherapy    last 06-01-2018   History of kidney stones    History of radiation therapy    last radiation 06-2018   Hyperlipidemia    currently under control   Hypertension    Irregular heart beats    Lymphadenopathy    Pain, lower leg    Bilateral   Peripheral arterial disease (HCC)    Pre-diabetes    Red-green color blindness    RLS (restless legs syndrome)    Snores    Wears glasses    Past Surgical History:  Procedure Laterality Date   CATARACT EXTRACTION W/ INTRAOCULAR LENS  IMPLANT, BILATERAL     COLONOSCOPY     DIRECT LARYNGOSCOPY Right 02/03/2022   Procedure: DIRECT LARYNGOSCOPY WITH BIOPSY OF RIGHT FALSE VOCAL CORD;  Surgeon: Ammon Bales, MD;  Location: [facility] OR;  Service: ENT;  Laterality: Right;   dislodged salava stone     ESOPHAGOSCOPY WITH DILITATION N/A 07/19/2023   Procedure: ESOPHAGOSCOPY, WITH DILATION;  Surgeon: Janita Mellow, MD;  Location: [facility] OR;  Service: ENT;  Laterality: N/A;   FRACTURE SURGERY     HAND ARTHROPLASTY  1995   crushed left hand   INGUINAL LYMPH NODE BIOPSY Left 01/02/2018   Procedure: LEFT INGUINAL LYMPH NODE BIOPSY;  Surgeon: Enid Harry, MD;  Location: [facility] OR;  Service: General;  Laterality: Left;   IR IMAGING GUIDED PORT INSERTION  01/15/2018   IR IMAGING GUIDED PORT INSERTION  08/10/2022   IR REMOVAL TUN ACCESS W/ PORT W/O FL MOD SED  03/11/2019   IR REPLACE G-TUBE SIMPLE WO FLUORO  06/21/2023   LAPAROSCOPIC INSERTION GASTROSTOMY TUBE N/A 06/19/2023   Procedure: LAPAROSCOPIC INSERTION GASTROSTOMY TUBE;  Surgeon: Junie Olds, MD;  Location: [facility] ORS;  Service: General;  Laterality: N/A;   LAPAROSCOPIC INSERTION GASTROSTOMY TUBE N/A 06/21/2023   Procedure: LAPAROSCOPIC INSERTION GASTROSTOMY TUBE;  Surgeon: Junie Olds, MD;  Location: [facility] ORS;  Service: General;  Laterality: N/A;   MEDIASTINOSCOPY N/A 07/22/2022   Procedure: MEDIASTINOSCOPY;  Surgeon: Zelphia Higashi, MD;  Location:  [facility] OR;  Service: Thoracic;  Laterality: N/A;   MICROLARYNGOSCOPY Left 01/17/2014   Procedure: MICROLARYNGOSCOPY WITH EXCISION OF THE BIOPSY OF LEFT VOCAL CORD LESION;  Surgeon: Janita Mellow, MD;  Location:  SURGERY CENTER;  Service: ENT;  Laterality: Left;   MICROLARYNGOSCOPY N/A 09/16/2020   Procedure: MICROLARYNGOSCOPY with Biopsy of vocal cord lesion;  Surgeon: Ammon Bales, MD;  Location: [facility] OR;  Service: ENT;  Laterality: N/A;   ORIF FOOT FRACTURE  2005   left   REFRACTIVE SURGERY Right    removed cloudiness in right eye after cataract removal    Social History:  reports that he quit smoking about 2 years ago. His smoking use included cigarettes. He started smoking about 39 years ago. He has a 18 pack-year smoking history. He has never used smokeless tobacco. He reports that he does not currently use alcohol after a past usage of about 6.0 standard drinks of alcohol per week. He reports current drug use. Drugs: , Marijuana, and Cocaine.  Allergies  Allergen Reactions   Bee Venom Anaphylaxis   Lisinopril  Swelling and Other (See Comments)    Lips became very swollen- "Allergic," per [facility]   Antifungal [Miconazole Nitrate] Other (See Comments)    Headaches- "Allergic," per [facility]   Zolpidem Tartrate Er Other (See Comments)    Leg cramps    Family History  Problem Relation Age of Onset   Breast cancer Mother    Diabetes Father    Hypertension Father    Stroke Father    Mental illness Sister    Hypertension Daughter    Mental illness Daughter    Hypertension Brother    Colon cancer Brother 45       passed away Nov 23, 2018   Breast cancer Sister    Esophageal cancer Neg Hx    Colon polyps Neg Hx    Rectal cancer Neg Hx    Stomach cancer Neg Hx     Prior to Admission medications   Medication Sig Start Date End Date Taking? Authorizing Provider  acetaminophen  (TYLENOL ) 160 MG/5ML solution Place 20.3 mLs (650 mg total) into feeding tube every 6 (six) hours as needed for mild  pain (pain score 1-3). 06/20/23  Yes Maczis, Michael M, PA-C  CHEST CONGESTION RELIEF 100 MG/5ML liquid Place 200 mg into feeding tube every 3 (three) hours as needed for cough or to loosen phlegm.   Yes [provider]  fentaNYL  (DURAGESIC ) 50 MCG/HR Place 1 patch onto the skin every other day.   Yes [provider]  ferrous sulfate  220 (44 Fe) MG/5ML solution Place 6.8 mLs (300 mg total) into feeding tube every other day. Patient taking differently: Place 300 mg into feeding tube daily with breakfast. 07/17/23  Yes Danford, Willis Harter, MD  gabapentin  (NEURONTIN ) 300 MG capsule Place 300 mg into feeding tube  2 (two) times daily.   Yes [provider]  glycopyrrolate  (ROBINUL ) 1 MG tablet Place 1 tablet (1 mg total) into feeding tube every other day. Crush tablet, place in water , and then place in feeding tube. Patient taking differently: Place 1 mg into feeding tube See admin instructions. Crush one 1 mg tablet, place in water , and into the feeding tube- ONCE A DAY AS NEEDED FOR EXCESS SECRETIONS 07/17/23  Yes Danford, Willis Harter, MD  hydrOXYzine  (ATARAX ) 25 MG tablet Place 25 mg into feeding tube every 6 (six) hours as needed for anxiety. 09/13/23  Yes [provider]  ipratropium-albuterol  (DUONEB) 0.5-2.5 (3) MG/3ML SOLN Take 3 mLs by nebulization every 6 (six) hours. 3 ml's, via nebulizer, every 6 hours and as needed for shortness of breath or wheezing 09/13/23  Yes [provider]  melatonin 3 MG TABS tablet Place 6 mg into feeding tube at bedtime. 09/13/23  Yes [provider]  Menthol-Zinc  Oxide (CALMOSEPTINE) 0.44-20.6 % OINT Apply 1 application  topically See admin instructions. Apply a thin layer to sacral region for fragile skin- between incontinent episodes   Yes [provider]  Nutritional Supplements (FEEDING SUPPLEMENT, GLUCERNA 1.5 CAL,) LIQD Place 355 mLs into feeding tube See admin instructions. "355 ml's, per tube, four  times a day with FWF 30 ml's before and after each bolus and an additional 360 ml's water  three times a day for hydration needs (Total for day: 1,420 ml's/2,130 kCal)"   Yes [provider]  oxyCODONE  (ROXICODONE ) 5 MG/5ML solution Place 5 mLs (5 mg total) into feeding tube every 6 (six) hours as needed for breakthrough pain. Patient taking differently: Place 10 mg into feeding tube every 4 (four) hours as needed (for pain). 06/20/23  Yes Maczis, Michael M, PA-C  pantoprazole  sodium (PROTONIX ) 40 mg Place 40 mg into feeding tube daily.   Yes [provider]  polyethylene glycol (MIRALAX  / GLYCOLAX ) 17 g packet Place 17 g into feeding tube See admin instructions. Dissolve 17 grams of powder in 4-6 ounces of water  and administer, via tube, twice a day 09/13/23  Yes [provider]  sennosides (SENOKOT) 8.8 MG/5ML syrup Place 8.8 mg into feeding tube daily. 09/13/23  Yes [provider]  albuterol  (VENTOLIN  HFA) 108 (90 Base) MCG/ACT inhaler Inhale 2 puffs into the lungs every 6 (six) hours as needed for wheezing or shortness of breath. Patient not taking: Reported on 09/25/2023 07/05/23   Aisha Hove, MD  Blood Glucose Monitoring Suppl DEVI 1 each by Does not apply route in the morning, at noon, and at bedtime. May substitute to any manufacturer covered by patient's insurance. 07/31/23   Wouk, Haynes Lips, MD  insulin  glargine (LANTUS ) 100 UNIT/ML Solostar Pen Inject 15 Units into the skin at bedtime. Patient not taking: Reported on 09/22/2023 07/31/23   Wouk, Haynes Lips, MD  Insulin  Pen Needle (PEN NEEDLE/5-BEVEL TIP) 32G X 4 MM MISC 1 each by Does not apply route at bedtime. 07/31/23   Wouk, Haynes Lips, MD  Nutritional Supplements (NUTREN 1.5) LIQD Give 2 cartons 4 times a day.  Flush with 30ml of water  before and after each feeding.  Give additional 360ml of water  TID for additional hydration. Patient not taking: Reported on 09/25/2023 06/26/23   Frankie Israel, MD  Protein (FEEDING SUPPLEMENT, PROSOURCE TF20,) liquid Place 60 mLs into feeding tube daily. Patient not taking: Reported on 09/25/2023 07/06/23   Aisha Hove, MD    Physical Exam: Vitals:  09/25/23 1942 09/25/23 2147 09/25/23 2222 09/25/23 2305  BP:  119/81    Pulse:  (!) 110  (!) 110  Resp: 20 18  (!) 30  Temp:  98.2 F (36.8 C)    TempSrc:  Oral    SpO2: 92% 100%  97%  Weight:   99.8 kg   Height:   6' (1.829 m)    Constitutional: Chronically ill looking, no distress NAD, calm, comfortable Eyes: PERRL, lids and conjunctivae normal ENMT: Tracheostomy in place, mucous membranes are moist. Posterior pharynx clear of any exudate or lesions.Normal dentition.  Neck: Tracheostomy with possible infection, normal, supple, no masses, no thyromegaly Respiratory: Decreased air entry bilaterally, hypoxic, requiring high flow oxygen, no accessory muscle use.  Cardiovascular: Sinus tachycardia, no murmurs / rubs / gallops. No extremity edema. 2+ pedal pulses. No carotid bruits.  Abdomen: no tenderness, no masses palpated. No hepatosplenomegaly. Bowel sounds positive.  Musculoskeletal: Good range of motion, no joint swelling or tenderness, Skin: no rashes, lesions, ulcers. No induration Neurologic: CN 2-12 grossly intact. Sensation intact, DTR normal. Strength 5/5 in all 4.  Psychiatric: Normal judgment and insight. Alert and oriented x 3. Normal mood  Data Reviewed:  Temperature 100.4, heart rate 133, respirate of 30, blood pressure 89/69, oxygen sat 85% on 6 L currently 10 L and tracheostomy.  Sodium 133, chloride 89, glucose 196, BUN 19.  Alkaline phosphatase 197, albumin 2.7.  White count is 19.7 with hemoglobin 8.5.  Acute viral screen is negative urinalysis negative.  Chest x-ray showed shallow inspiration with bibasilar atelectasis.  CT chest abdomen pelvis showed interval tracheostomy tube with abnormal appearance of gas and debris around the tracheostomy tube.   Possible focal perforation versus infection.  Interval small volume fluid stranding in the anterior mediastinal and generalized hazy appearance of mediastinal fat.  Also soft tissues thickening at the thoracic inlet.  Also findings consistent with multifocal pneumonia  Assessment and Plan:  #1 sepsis due to multiple focal pneumonia: Patient will be admitted.  Initiate sepsis protocol.  IV antibiotics and IV fluids.  Blood cultures obtained.  Will also include sputum cultures if obtainable.  #2 head and neck cancer: Status post tracheostomy.  Suspicion for likely infection around the tracheostomy site.  Continue antibiotics.  Patient getting chemo for his cancer which is likely to be on hold until infection is cleared.  Will for continue to follow-up with oncologist Dr.Kale  #3 multifocal pneumonia: As per above.  #4 acute hypoxic respiratory failure: Requiring up to 10 L of oxygen.  Secondary to pneumonia.  Continue to titrate oxygen as pneumonia is treated.  #5 hypotension: Probably related to sepsis.  Blood pressure has improved with IV fluids.  Continue maintenance fluid  #6 large B-cell lymphoma: Appears in remission.  Continue treatment per oncology.  #7 anemia of chronic disease: Continue to monitor H&H.  #8 GERD: Continue with PPIs  #9 essential hypertension: Will resume home regimen  #10 chronic pain syndrome: Secondary to cancer pain.  Resume home oxycodone .    Advance Care Planning:   Code Status: Prior full code  Consults: Oncologist Dr. Salomon Cree  Family Communication: No family at bedside  Severity of Illness: The appropriate patient status for this patient is INPATIENT. Inpatient status is judged to be reasonable and necessary in order to provide the required intensity of service to ensure the patient's safety. The patient's presenting symptoms, physical exam findings, and initial radiographic and laboratory data in the context of their chronic comorbidities is felt to place  them at high risk for further clinical deterioration. Furthermore, it is not anticipated that the patient will be medically stable for discharge from the hospital within 2 midnights of admission.   * I certify that at the point of admission it is my clinical judgment that the patient will require inpatient hospital care spanning beyond 2 midnights from the point of admission due to high intensity of service, high risk for further deterioration and high frequency of surveillance required.*  AuthorCarolin Chyle, MD 09/25/2023 11:27 PM  For on call review [email or web address].

## 2023-09-25 NOTE — Progress Notes (Deleted)
 Called by RN to evaluate pt in the lobby. Briefly this is a 68 yo male presenting via [facility] from [facility] for chemotherapy treatment including carboplatin  and Taxol.  Patient is followed by Dr. Salomon Cree.  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  While at [facility] patient underwent emergent tracheostomy and thoracic surgery.  Patient did receive his first round of chemo while at [facility].  He was discharged to [facility] for further care starting on 09/14/2023.  On my arrival patient is on 10 L with oxygen saturation in the 90s and tachycardic to 140.  Patient was hypoxic to 83 on 6 L on arrival. He was also noted to be hypertensive 157/109.  Patient shakes his head no when asked if he has any shortness of breath or chest pain.  His Port-A-Cath was already accessed prior to arrival.   Patient transported to the ED for emergent evaluation. Report given to accepting RN and EDP.   I have spent a total of 20 minutes minutes of face-to-face and non-face-to-face time preparing to see the patient, performing a medically appropriate examination, ordering tests/procedures/medications, documenting clinical information in the electronic health record, and care coordination.

## 2023-09-25 NOTE — Progress Notes (Signed)
 RT called to ED WA 19 for trach pt. Pt has #6 shiley XLT in place. Rt placed pt on 35% TC and suction thick yellow secretions. Pt doing well at this time. No further order for Rt.

## 2023-09-25 NOTE — ED Triage Notes (Addendum)
 Pt BIB [facility] to cancer center for cancer treatment; pt was in respiratory distress SpO2 86 on 15 L O2 tachy at 137. Pt brought to ED; temp 100.4. Pt from [facility]. Pt has a trach in place; yellowish fluids noted at site.

## 2023-09-25 NOTE — ED Notes (Signed)
 Respiratory called to suction patient at this time. Per respiratory they will be down shortly.

## 2023-09-26 ENCOUNTER — Inpatient Hospital Stay: Payer: PPO

## 2023-09-26 ENCOUNTER — Inpatient Hospital Stay (HOSPITAL_COMMUNITY)

## 2023-09-26 ENCOUNTER — Inpatient Hospital Stay: Payer: PPO | Admitting: Hematology

## 2023-09-26 ENCOUNTER — Other Ambulatory Visit: Payer: Self-pay

## 2023-09-26 DIAGNOSIS — J9621 Acute and chronic respiratory failure with hypoxia: Secondary | ICD-10-CM | POA: Diagnosis not present

## 2023-09-26 DIAGNOSIS — J449 Chronic obstructive pulmonary disease, unspecified: Secondary | ICD-10-CM | POA: Diagnosis not present

## 2023-09-26 DIAGNOSIS — J189 Pneumonia, unspecified organism: Secondary | ICD-10-CM | POA: Diagnosis not present

## 2023-09-26 DIAGNOSIS — E785 Hyperlipidemia, unspecified: Secondary | ICD-10-CM

## 2023-09-26 DIAGNOSIS — A419 Sepsis, unspecified organism: Secondary | ICD-10-CM | POA: Diagnosis not present

## 2023-09-26 LAB — CBC
HCT: 23.5 % — ABNORMAL LOW (ref 39.0–52.0)
Hemoglobin: 7 g/dL — ABNORMAL LOW (ref 13.0–17.0)
MCH: 26.5 pg (ref 26.0–34.0)
MCHC: 29.8 g/dL — ABNORMAL LOW (ref 30.0–36.0)
MCV: 89 fL (ref 80.0–100.0)
Platelets: 400 10*3/uL (ref 150–400)
RBC: 2.64 MIL/uL — ABNORMAL LOW (ref 4.22–5.81)
RDW: 17.7 % — ABNORMAL HIGH (ref 11.5–15.5)
WBC: 12.8 10*3/uL — ABNORMAL HIGH (ref 4.0–10.5)
nRBC: 0 % (ref 0.0–0.2)

## 2023-09-26 LAB — PROTIME-INR
INR: 1.3 — ABNORMAL HIGH (ref 0.8–1.2)
Prothrombin Time: 16.3 s — ABNORMAL HIGH (ref 11.4–15.2)

## 2023-09-26 LAB — EXPECTORATED SPUTUM ASSESSMENT W GRAM STAIN, RFLX TO RESP C

## 2023-09-26 LAB — RESPIRATORY PANEL BY PCR

## 2023-09-26 LAB — COMPREHENSIVE METABOLIC PANEL WITH GFR
ALT: 12 U/L (ref 0–44)
AST: 10 U/L — ABNORMAL LOW (ref 15–41)
Albumin: 2.1 g/dL — ABNORMAL LOW (ref 3.5–5.0)
Alkaline Phosphatase: 142 U/L — ABNORMAL HIGH (ref 38–126)
Anion gap: 9 (ref 5–15)
BUN: 23 mg/dL (ref 8–23)
CO2: 30 mmol/L (ref 22–32)
Calcium: 8.7 mg/dL — ABNORMAL LOW (ref 8.9–10.3)
Chloride: 95 mmol/L — ABNORMAL LOW (ref 98–111)
Creatinine, Ser: 0.74 mg/dL (ref 0.61–1.24)
GFR, Estimated: >60 mL/min (ref >60)
Glucose, Bld: 136 mg/dL — ABNORMAL HIGH (ref 70–99)
Potassium: 4.4 mmol/L (ref 3.5–5.1)
Sodium: 134 mmol/L — ABNORMAL LOW (ref 135–145)
Total Bilirubin: 0.5 mg/dL (ref 0.0–1.2)
Total Protein: 6.6 g/dL (ref 6.5–8.1)

## 2023-09-26 LAB — MRSA NEXT GEN BY PCR, NASAL
MRSA by PCR Next Gen: NOT DETECTED
MRSA by PCR Next Gen: NOT DETECTED

## 2023-09-26 LAB — GLUCOSE, CAPILLARY
Glucose-Capillary: 127 mg/dL — ABNORMAL HIGH (ref 70–99)
Glucose-Capillary: 126 mg/dL — ABNORMAL HIGH (ref 70–99)
Glucose-Capillary: 163 mg/dL — ABNORMAL HIGH (ref 70–99)
Glucose-Capillary: 145 mg/dL — ABNORMAL HIGH (ref 70–99)
Glucose-Capillary: 118 mg/dL — ABNORMAL HIGH (ref 70–99)

## 2023-09-26 LAB — CORTISOL-AM, BLOOD: Cortisol - AM: 22.8 ug/dL — ABNORMAL HIGH (ref 6.7–22.6)

## 2023-09-26 MED ORDER — MORPHINE SULFATE (PF) 2 MG/ML IV SOLN
2.0000 mg | INTRAVENOUS | Status: DC | PRN
  Administered 2023-09-26 – 2023-10-13 (×62): 2 mg via INTRAVENOUS
  Filled 2023-09-26 (×62): qty 1

## 2023-09-26 MED ORDER — OSMOLITE 1.5 CAL PO LIQD
1000.0000 mL | ORAL | Status: DC
  Administered 2023-09-26: 1000 mL
  Filled 2023-09-26: qty 1000

## 2023-09-26 MED ORDER — FENTANYL CITRATE (PF) 100 MCG/2ML IJ SOLN: 100.0000 ug | Freq: Once | INTRAMUSCULAR | Status: AC

## 2023-09-26 MED ORDER — VANCOMYCIN HCL 2000 MG/400ML IV SOLN
2000.0000 mg | Freq: Once | INTRAVENOUS | Status: AC
  Administered 2023-09-26: 2000 mg via INTRAVENOUS
  Filled 2023-09-26: qty 400

## 2023-09-26 MED ORDER — SODIUM CHLORIDE 0.9% FLUSH
10.0000 mL | Freq: Two times a day (BID) | INTRAVENOUS | Status: DC
  Administered 2023-09-26 – 2023-09-29 (×8): 10 mL
  Administered 2023-09-30: 20 mL
  Administered 2023-09-30 – 2023-10-04 (×8): 10 mL
  Administered 2023-10-04: 40 mL
  Administered 2023-10-05 – 2023-10-10 (×10): 10 mL
  Administered 2023-10-10: 40 mL
  Administered 2023-10-11 – 2023-10-13 (×5): 10 mL

## 2023-09-26 MED ORDER — MIDAZOLAM HCL 2 MG/2ML IJ SOLN
INTRAMUSCULAR | Status: AC
  Administered 2023-09-26: 2 mg via INTRAVENOUS
  Filled 2023-09-26: qty 2

## 2023-09-26 MED ORDER — CHLORHEXIDINE GLUCONATE CLOTH 2 % EX PADS
6.0000 | MEDICATED_PAD | Freq: Every day | CUTANEOUS | Status: DC
  Administered 2023-09-26 – 2023-10-13 (×17): 6 via TOPICAL

## 2023-09-26 MED ORDER — PANTOPRAZOLE SODIUM 40 MG PO PACK: 40.0000 mg | PACK | Freq: Every day | ORAL | Status: DC

## 2023-09-26 MED ORDER — HYDROXYZINE HCL 25 MG PO TABS: 25.0000 mg | ORAL_TABLET | Freq: Four times a day (QID) | ORAL | Status: DC | PRN

## 2023-09-26 MED ORDER — PROPOFOL 500 MG/50ML IV EMUL
INTRAVENOUS | Status: AC
  Filled 2023-09-26: qty 50

## 2023-09-26 MED ORDER — MIDAZOLAM HCL 2 MG/2ML IJ SOLN: 2.0000 mg | Freq: Once | INTRAMUSCULAR | Status: AC

## 2023-09-26 MED ORDER — FENTANYL CITRATE (PF) 100 MCG/2ML IJ SOLN
INTRAMUSCULAR | Status: AC
  Administered 2023-09-26: 100 ug via INTRAVENOUS
  Filled 2023-09-26: qty 2

## 2023-09-26 MED ORDER — VANCOMYCIN HCL 1500 MG/300ML IV SOLN
1500.0000 mg | Freq: Two times a day (BID) | INTRAVENOUS | Status: DC
  Administered 2023-09-27 – 2023-09-28 (×3): 1500 mg via INTRAVENOUS
  Filled 2023-09-26 (×3): qty 300

## 2023-09-26 MED ORDER — FENTANYL 50 MCG/HR TD PT72
1.0000 | MEDICATED_PATCH | TRANSDERMAL | Status: DC
  Administered 2023-09-26 – 2023-09-28 (×2): 1 via TRANSDERMAL
  Filled 2023-09-26 (×2): qty 1

## 2023-09-26 MED ORDER — IOHEXOL 300 MG/ML  SOLN
75.0000 mL | Freq: Once | INTRAMUSCULAR | Status: AC | PRN
  Administered 2023-09-26: 75 mL via INTRAVENOUS

## 2023-09-26 MED ORDER — SODIUM CHLORIDE 0.9% FLUSH: 10.0000 mL | INTRAVENOUS | Status: DC | PRN

## 2023-09-26 MED ORDER — PANTOPRAZOLE SODIUM 40 MG IV SOLR
40.0000 mg | Freq: Every day | INTRAVENOUS | Status: DC
  Administered 2023-09-26 – 2023-10-13 (×18): 40 mg via INTRAVENOUS
  Filled 2023-09-26 (×18): qty 10

## 2023-09-26 MED ORDER — OXYCODONE HCL 5 MG PO TABS
10.0000 mg | ORAL_TABLET | ORAL | Status: DC | PRN
  Administered 2023-09-27 – 2023-10-13 (×25): 10 mg
  Filled 2023-09-26 (×25): qty 2

## 2023-09-26 MED ORDER — GABAPENTIN 300 MG PO CAPS
300.0000 mg | ORAL_CAPSULE | Freq: Two times a day (BID) | ORAL | Status: DC
  Administered 2023-09-26: 300 mg
  Filled 2023-09-26: qty 1

## 2023-09-26 MED ORDER — GLUCERNA 1.5 CAL PO LIQD
355.0000 mL | Freq: Four times a day (QID) | ORAL | Status: DC
  Filled 2023-09-26: qty 474
  Filled 2023-09-26: qty 1000
  Filled 2023-09-26: qty 474

## 2023-09-26 MED ORDER — GLYCOPYRROLATE 1 MG PO TABS: 1.0000 mg | ORAL_TABLET | Freq: Every day | ORAL | Status: DC | PRN

## 2023-09-26 MED ORDER — OXYCODONE HCL 5 MG/5ML PO SOLN: 10.0000 mg | ORAL | Status: DC | PRN

## 2023-09-26 MED ORDER — SODIUM CHLORIDE (PF) 0.9 % IJ SOLN
INTRAMUSCULAR | Status: AC
  Filled 2023-09-26: qty 50

## 2023-09-26 MED ORDER — PROSOURCE TF20 ENFIT COMPATIBL EN LIQD
60.0000 mL | Freq: Every day | ENTERAL | Status: DC
  Administered 2023-09-26: 60 mL
  Filled 2023-09-26: qty 60

## 2023-09-26 MED ORDER — INSULIN ASPART 100 UNIT/ML IJ SOLN
0.0000 [IU] | INTRAMUSCULAR | Status: DC
  Administered 2023-09-26 – 2023-09-27 (×3): 2 [IU] via SUBCUTANEOUS
  Administered 2023-09-28: 3 [IU] via SUBCUTANEOUS
  Administered 2023-09-28: 2 [IU] via SUBCUTANEOUS
  Administered 2023-09-28 (×2): 3 [IU] via SUBCUTANEOUS
  Administered 2023-09-28 – 2023-09-29 (×3): 2 [IU] via SUBCUTANEOUS
  Administered 2023-09-29: 1 [IU] via SUBCUTANEOUS
  Administered 2023-09-29: 2 [IU] via SUBCUTANEOUS
  Administered 2023-09-29: 3 [IU] via SUBCUTANEOUS
  Administered 2023-09-29: 2 [IU] via SUBCUTANEOUS
  Administered 2023-09-30: 1 [IU] via SUBCUTANEOUS
  Administered 2023-09-30 – 2023-10-01 (×10): 2 [IU] via SUBCUTANEOUS
  Administered 2023-10-01: 3 [IU] via SUBCUTANEOUS
  Administered 2023-10-02: 2 [IU] via SUBCUTANEOUS
  Administered 2023-10-02: 1 [IU] via SUBCUTANEOUS
  Administered 2023-10-02 – 2023-10-04 (×11): 2 [IU] via SUBCUTANEOUS
  Administered 2023-10-04: 1 [IU] via SUBCUTANEOUS
  Administered 2023-10-04 (×3): 2 [IU] via SUBCUTANEOUS
  Administered 2023-10-04: 1 [IU] via SUBCUTANEOUS
  Administered 2023-10-05: 2 [IU] via SUBCUTANEOUS
  Administered 2023-10-05 (×2): 1 [IU] via SUBCUTANEOUS
  Administered 2023-10-05 (×3): 2 [IU] via SUBCUTANEOUS
  Administered 2023-10-06: 1 [IU] via SUBCUTANEOUS
  Administered 2023-10-06 (×4): 2 [IU] via SUBCUTANEOUS
  Administered 2023-10-06: 1 [IU] via SUBCUTANEOUS
  Administered 2023-10-07: 2 [IU] via SUBCUTANEOUS
  Administered 2023-10-07: 1 [IU] via SUBCUTANEOUS
  Administered 2023-10-07 (×3): 2 [IU] via SUBCUTANEOUS
  Administered 2023-10-07 – 2023-10-08 (×2): 3 [IU] via SUBCUTANEOUS
  Administered 2023-10-08 (×2): 2 [IU] via SUBCUTANEOUS
  Administered 2023-10-08: 1 [IU] via SUBCUTANEOUS
  Administered 2023-10-08 – 2023-10-09 (×3): 2 [IU] via SUBCUTANEOUS
  Administered 2023-10-09: 1 [IU] via SUBCUTANEOUS
  Administered 2023-10-09: 2 [IU] via SUBCUTANEOUS
  Administered 2023-10-09: 4 [IU] via SUBCUTANEOUS
  Administered 2023-10-09 – 2023-10-10 (×3): 2 [IU] via SUBCUTANEOUS
  Administered 2023-10-10: 1 [IU] via SUBCUTANEOUS
  Administered 2023-10-10 – 2023-10-11 (×4): 2 [IU] via SUBCUTANEOUS
  Administered 2023-10-11: 1 [IU] via SUBCUTANEOUS
  Administered 2023-10-11 (×3): 2 [IU] via SUBCUTANEOUS
  Administered 2023-10-12 (×2): 1 [IU] via SUBCUTANEOUS
  Administered 2023-10-12 – 2023-10-13 (×7): 2 [IU] via SUBCUTANEOUS

## 2023-09-26 MED ORDER — PIPERACILLIN-TAZOBACTAM 3.375 G IVPB
3.3750 g | Freq: Three times a day (TID) | INTRAVENOUS | Status: DC
  Administered 2023-09-26 – 2023-09-29 (×8): 3.375 g via INTRAVENOUS
  Filled 2023-09-26 (×8): qty 50

## 2023-09-26 MED ORDER — LIDOCAINE HCL 1 % IJ SOLN
INTRAMUSCULAR | Status: AC
Start: 2023-09-26 — End: 2023-09-27
  Filled 2023-09-26: qty 20

## 2023-09-26 NOTE — Plan of Care (Signed)

## 2023-09-26 NOTE — Progress Notes (Signed)
 Patient arrived to 2M13. VSS. Copious, odorous tracheal secretions. Pt denied any needs at this time. Bedside report given to Irineo Manns, RN.

## 2023-09-26 NOTE — Progress Notes (Addendum)
 PROGRESS NOTE    Jesus [name]  WUJ:[id number] DOB: 03-13-1956 DOA: 09/25/2023 PCP: [name], MD    Brief Narrative:  Jesus [name] is a 68 y.o. male with medical history significant of diffuse large B cell lymphoma, anemia of chronic disease, essential hypertension, history of laryngeal cancer, GERD, COPD, diastolic CHF, hyperlipidemia, who is status post tracheostomy.  Currently on chemotherapy being followed by oncology who was at the cancer center today for his chemotherapy which included carboplatin  and Taxol.  He initially was at [facility] where he had the paratracheal mass compressing his airway.  That was following radiation.  He had emergent tracheostomy with thoracic surgery.  He was started on chemo there and then transferred to [facility].  On arrival at the cancer center patient was noted to have significant hypoxemia.  He is requiring 10 L of oxygen.  Also tachycardic with a heart rate of 140.  He was sent to the ER where he was initially on 6 L and had a temperature of 102.  Patient therefore has met sepsis criteria.  Further evaluation showed bilateral pneumonia.  At this point patient appears to have sepsis due to pneumonia and is being admitted for further evaluation and treatment.    Assessment and Plan:  sepsis due to multiple focal pneumonia:  -IV antibiotics  - Blood cultures obtained - sputum cultures    head and neck cancer: - Status post tracheostomy.   -Suspicion for likely infection around the tracheostomy site.  - Continue antibiotics.   - follow-up with oncologist Dr.Kale-added to treatment team  - CT scan done: Extensive debris like appearance encompassing the tracheostomy at the upper airway which is extensively affected by tumor. This could reflect infectious debris, blood clot, and or necrotic tumor. Patient is certainly at risk for tracheoesophageal fistula given the extent of tumor.--- PCCM consulted  acute hypoxic respiratory failure:  -Requiring up  to 10 L of oxygen.   -Secondary to pneumonia.   - Wean O2 as tolerated - Unclear how much patient is chronically on at [facility] but upon d/c at [facility]: Patient being discharged to facility on trach mask with FiO2 25% at 5 L/min and managing secretions well.  - PCCM consult for trach management    hypotension: - Probably related to sepsis.  Blood pressure has improved with IV fluid   large B-cell lymphoma:  - treatment per oncology. -Dr. Salomon Cree added to treatment team    anemia of chronic disease: -monitor H&H.   GERD:  -Continue with PPIs   chronic pain syndrome: Secondary to cancer pain.  Resume home oxycodone . -PRN IV for now as well   DVT prophylaxis: enoxaparin  (LOVENOX ) injection 40 mg Start: 09/26/23 1000    Code Status: Full Code Family Communication: None at bedside  Disposition Plan:  Level of care: Telemetry Status is: Inpatient     Consultants:  Dr. Salomon Cree added to treatment team but not formally consulted   Subjective: Patient back from being off the floor and not hooked up to oxygen so still complaining of a lot of shortness of breath  Objective: Vitals:   09/26/23 0732 09/26/23 0733 09/26/23 1042 09/26/23 1242  BP:   117/64   Pulse:   (!) 114   Resp:   20   Temp:   99.5 F (37.5 C)   TempSrc:   Oral   SpO2: 98% 98% 95% 96%  Weight:      Height:        Intake/Output Summary (Last  24 hours) at 09/26/2023 1246 Last data filed at 09/26/2023 1610 Gross per 24 hour  Intake 2137.03 ml  Output 350 ml  Net 1787.03 ml   Filed Weights   09/25/23 2222  Weight: 99.8 kg    Examination:   General: Appearance:     Overweight male in no acute distress     Lungs:     respirations unlabored  Heart:    Tachycardic.    MS:   All extremities are intact.    Neurologic:   Awake, alert       Data Reviewed: I have personally reviewed following labs and imaging studies  CBC: Recent Labs  Lab 09/22/23 1019 09/25/23 1352 09/26/23 0513  WBC 14.5* 19.7*  12.8*  NEUTROABS 11.1* 16.7*  --   HGB 8.4* 8.5* 7.0*  HCT 26.3* 28.3* 23.5*  MCV 82.2 87.1 89.0  PLT 335 458* 400   Basic Metabolic Panel: Recent Labs  Lab 09/22/23 1019 09/25/23 1352 09/26/23 0413  NA 133* 133* 134*  K 4.8 4.9 4.4  CL 92* 89* 95*  CO2 34* 32 30  GLUCOSE 183* 186* 136*  BUN 26* 29* 23  CREATININE 0.96 0.88 0.74  CALCIUM  9.5 9.3 8.7*  MG  --  2.1  --    GFR: Estimated Creatinine Clearance: 109.6 mL/min (by C-G formula based on SCr of 0.74 mg/dL). Liver Function Tests: Recent Labs  Lab 09/22/23 1019 09/25/23 1352 09/26/23 0413  AST 14* 15 10*  ALT 12 14 12   ALKPHOS 152* 197* 142*  BILITOT 0.3 0.6 0.5  PROT 8.2* 8.2* 6.6  ALBUMIN 3.4* 2.7* 2.1*   No results for input(s): "LIPASE", "AMYLASE" in the last 168 hours. No results for input(s): "AMMONIA" in the last 168 hours. Coagulation Profile: Recent Labs  Lab 09/25/23 1352 09/26/23 0413  INR 1.2 1.3*   Cardiac Enzymes: No results for input(s): "CKTOTAL", "CKMB", "CKMBINDEX", "TROPONINI" in the last 168 hours. BNP (last 3 results) No results for input(s): "PROBNP" in the last 8760 hours. HbA1C: No results for input(s): "HGBA1C" in the last 72 hours. CBG: Recent Labs  Lab 09/26/23 0735 09/26/23 1122  GLUCAP 127* 126*   Lipid Profile: No results for input(s): "CHOL", "HDL", "LDLCALC", "TRIG", "CHOLHDL", "LDLDIRECT" in the last 72 hours. Thyroid  Function Tests: No results for input(s): "TSH", "T4TOTAL", "FREET4", "T3FREE", "THYROIDAB" in the last 72 hours. Anemia Panel: No results for input(s): "VITAMINB12", "FOLATE", "FERRITIN", "TIBC", "IRON", "RETICCTPCT" in the last 72 hours. Sepsis Labs: Recent Labs  Lab 09/25/23 1359  LATICACIDVEN 1.2    Recent Results (from the past 240 hours)  Blood Culture (routine x 2)     Status: None (Preliminary result)   Collection Time: 09/25/23  1:52 PM   Specimen: BLOOD  Result Value Ref Range Status   Specimen Description   Final    BLOOD RIGHT  ANTECUBITAL Performed at [facility], [address], [place], [place] [address]    Special Requests   Final    BOTTLES DRAWN AEROBIC AND ANAEROBIC Blood Culture adequate volume Performed at [facility], [address] [place], [place] [address]    Culture   Final    NO GROWTH < 12 HOURS Performed at [facility], [address] [place], [place] [address]    Report Status PENDING  Incomplete  Resp panel by RT-PCR (RSV, Flu A&B, Covid) Anterior Nasal Swab     Status: None   Collection Time: 09/25/23  3:02 PM  Specimen: Anterior Nasal Swab  Result Value Ref Range Status   SARS Coronavirus 2 by RT PCR NEGATIVE NEGATIVE Final    Comment: (NOTE) SARS-CoV-2 target nucleic acids are NOT DETECTED.  The SARS-CoV-2 RNA is generally detectable in upper respiratory specimens during the acute phase of infection. The lowest concentration of SARS-CoV-2 viral copies this assay can detect is 138 copies/mL. A negative result does not preclude SARS-Cov-2 infection and should not be used as the sole basis for treatment or other patient management decisions. A negative result may occur with  improper specimen collection/handling, submission of specimen other than nasopharyngeal swab, presence of viral mutation(s) within the areas targeted by this assay, and inadequate number of viral copies(<138 copies/mL). A negative result must be combined with clinical observations, patient history, and epidemiological information. The expected result is Negative.  Fact Sheet for Patients:  BloggerCourse.com  Fact Sheet for Healthcare Providers:  SeriousBroker.it  This test is no t yet approved or cleared by the [place]  FDA and  has been authorized for detection and/or diagnosis of SARS-CoV-2 by FDA under an Emergency Use Authorization (EUA). This EUA will remain  in effect (meaning this test can be used)  for the duration of the COVID-19 declaration under Section 564(b)(1) of the Act, 21 U.S.C.section 360bbb-3(b)(1), unless the authorization is terminated  or revoked sooner.       Influenza A by PCR NEGATIVE NEGATIVE Final   Influenza B by PCR NEGATIVE NEGATIVE Final    Comment: (NOTE) The Xpert Xpress SARS-CoV-2/FLU/RSV plus assay is intended as an aid in the diagnosis of influenza from Nasopharyngeal swab specimens and should not be used as a sole basis for treatment. Nasal washings and aspirates are unacceptable for Xpert Xpress SARS-CoV-2/FLU/RSV testing.  Fact Sheet for Patients: BloggerCourse.com  Fact Sheet for Healthcare Providers: SeriousBroker.it  This test is not yet approved or cleared by the [place]  FDA and has been authorized for detection and/or diagnosis of SARS-CoV-2 by FDA under an Emergency Use Authorization (EUA). This EUA will remain in effect (meaning this test can be used) for the duration of the COVID-19 declaration under Section 564(b)(1) of the Act, 21 U.S.C. section 360bbb-3(b)(1), unless the authorization is terminated or revoked.     Resp Syncytial Virus by PCR NEGATIVE NEGATIVE Final    Comment: (NOTE) Fact Sheet for Patients: BloggerCourse.com  Fact Sheet for Healthcare Providers: SeriousBroker.it  This test is not yet approved or cleared by the [place]  FDA and has been authorized for detection and/or diagnosis of SARS-CoV-2 by FDA under an Emergency Use Authorization (EUA). This EUA will remain in effect (meaning this test can be used) for the duration of the COVID-19 declaration under Section 564(b)(1) of the Act, 21 U.S.C. section 360bbb-3(b)(1), unless the authorization is terminated or revoked.  Performed at [facility], [address] [place], [place] [address]   Blood Culture (routine x 2)     Status:  None (Preliminary result)   Collection Time: 09/25/23  3:33 PM   Specimen: BLOOD LEFT HAND  Result Value Ref Range Status   Specimen Description   Final    BLOOD LEFT HAND Performed at [facility], [address] [place], [place] [address]    Special Requests   Final    BOTTLES DRAWN AEROBIC AND ANAEROBIC Blood Culture results may not be optimal due to an inadequate volume of blood received in culture bottles Performed at [facility], [address], [place], [place] [address]  Culture   Final    NO GROWTH < 24 HOURS Performed at [facility], [address] [place], [place] [address]    Report Status PENDING  Incomplete  Expectorated Sputum Assessment w Gram Stain, Rflx to Resp Cult     Status: None   Collection Time: 09/26/23  9:03 AM   Specimen: Nasopharyngeal Swab; Sputum  Result Value Ref Range Status   Specimen Description EXPECTORATED SPUTUM  Final   Special Requests NONE  Final   Sputum evaluation   Final    Sputum specimen not acceptable for testing.  Please recollect.   NOTIFIED WHITESELL, C  Performed at [facility], [address] [place], [place] [address]    Report Status 09/26/2023 FINAL  Final         Radiology Studies: CT Soft Tissue Neck W Contrast Result Date: 09/26/2023 CLINICAL DATA:  Shortness of breath EXAM: CT NECK WITH CONTRAST TECHNIQUE: Multidetector CT imaging of the neck was performed using the standard protocol following the bolus administration of intravenous contrast. RADIATION DOSE REDUCTION: This exam was performed according to the departmental dose-optimization program which includes automated exposure control, adjustment of the mA and/or kV according to patient size and/or use of iterative reconstruction technique. CONTRAST:  75mL OMNIPAQUE  IOHEXOL  300 MG/ML  SOLN COMPARISON:  06/15/2023 FINDINGS: Pharynx and larynx: Irregular masslike thickening involving the upper cervical esophagus  and infraglottic airway with tracheostomy tube. Patchy gas bubbles encompass the tracheostomy tube in the airway at the thoracic inlet. This frothy material may reflect necrotic tumor, infection, or prior bleeding - no active hemorrhage is seen. Patient is certainly at risk for esophagotracheal fistula. The degree of tracheal narrowing is improved. Submucosal low-density thickening involving the supraglottic airway, symmetric and presumably related to radiotherapy. Salivary glands: No acute finding Thyroid : Patchy low-density in the right thyroid  since prior, direct invasion of the left para median thyroid  suspected on comparison study. The cystic spaces could reflect progressive tumor with necrosis or infectious collections, they diffusely involve the right lobe. Lymph nodes: Multiple heterogeneous enhancing cervical lymph nodes in the left jugular chain which are more prominent than before, up to 14 mm in the left lower neck on 6:91 Vascular: Extrinsic narrowing of the lower left IJ. Porta catheter on the right in unremarkable position. No pseudoaneurysm seen. Limited intracranial: No acute finding Visualized orbits: No acute finding Mastoids and visualized paranasal sinuses: Clear Skeleton: Generalized cervical spine degeneration. Upper chest: Clear apical lungs IMPRESSION: Extensive debris like appearance encompassing the tracheostomy at the upper airway which is extensively affected by tumor. This could reflect infectious debris, blood clot, and or necrotic tumor. Patient is certainly at risk for tracheoesophageal fistula given the extent of tumor. Evidence of direct left thyroid  invasion by tumor on prior study. Progressive low-density areas in the right lobe thyroid  which could be infiltrating tumor or infectious collections. Submucosal thickening of the supraglottic airway, symmetric and presumably treatment related. Malignant adenopathy in the left jugular chain. Electronically Signed   By: Ronnette Coke  M.D.   On: 09/26/2023 09:18   CT CHEST ABDOMEN PELVIS WO CONTRAST Result Date: 09/25/2023 CLINICAL DATA:  Sepsis cancer EXAM: CT CHEST, ABDOMEN AND PELVIS WITHOUT CONTRAST TECHNIQUE: Multidetector CT imaging of the chest, abdomen and pelvis was performed following the standard protocol without IV contrast. RADIATION DOSE REDUCTION: This exam was performed according to the departmental dose-optimization program which includes automated exposure control, adjustment of the mA and/or kV according to patient size and/or use  of iterative reconstruction technique. COMPARISON:  CT 08/03/2023, 07/15/2023, PET CT 04/14/2023 FINDINGS: CT CHEST FINDINGS Cardiovascular: Limited evaluation without intravenous contrast. Right-sided central venous port tip at the cavoatrial region. No significant pericardial effusion. Nonaneurysmal aorta Mediastinum/Nodes: Interim tracheostomy tube. Abnormal gas and debris surrounding the tracheostomy tube, series 2, image 4 through 10. Tip of the tube terminates several cm above the carina. Tracheal deviation to the right from known mass lesion. Enlarged right paratracheal node measures 14 mm compared with 10 mm previously. Interval small volume fluid in the anterior mediastinum and generalized hazy appearance of the mediastinal fat compared to prior. Circumferential soft tissue mass/thickening at the included thoracic inlet surrounds the trachea. Mass appears slightly smaller compared with 07/15/2023 chest CT. Hazy density surrounding the distal trachea to the bifurcation and extends to the subcarinal region. Lungs/Pleura: Dense bilateral lower lobe consolidations with air bronchograms. Adjacent heterogeneous consolidations and ground-glass densities. No pleural effusion. Musculoskeletal: No acute osseous abnormality CT ABDOMEN PELVIS FINDINGS Hepatobiliary: No focal liver abnormality is seen. No gallstones, gallbladder wall thickening, or biliary dilatation. Pancreas: Unremarkable. No  pancreatic ductal dilatation or surrounding inflammatory changes. Spleen: Normal in size without focal abnormality. Adrenals/Urinary Tract: Adrenal glands are stable in appearance. Bilateral renal cysts for which no imaging follow-up is recommended. Punctate nonobstructing left kidney stone. No hydronephrosis. Nonspecific left greater than right perinephric stranding. The bladder is unremarkable Stomach/Bowel: The stomach contains a gastrostomy tube in the distal body. There is no dilated small bowel. No acute bowel wall thickening. Negative appendix. Vascular/Lymphatic: Aortic atherosclerosis. No enlarged abdominal or pelvic lymph nodes. Reproductive: Slightly enlarged prostate Other: Negative for pelvic effusion or free air. Small fat containing umbilical hernia. Musculoskeletal: Degenerative changes. No acute osseous abnormality. IMPRESSION: 1. Interim tracheostomy tube with abnormal appearance of gas and debris surrounding the tracheostomy tube. Findings could be secondary to focal perforation versus infection. Contrast-enhanced CT of the neck/upper chest could be obtained if patient able to tolerate. 2. Interval small volume fluid and stranding in the anterior mediastinum and generalized hazy appearance of the mediastinal fat compared to prior, which may be inflammatory or infectious in etiology. New dense bilateral lower lobe consolidations with air bronchograms, suspect for multifocal pneumonia and or aspiration. 3. Circumferential soft tissue mass/thickening at the included thoracic inlet surrounds the trachea. Left paratracheal mass appears slightly smaller compared with 07/15/2023 chest CT, but there is slight interval enlargement of the right paratracheal node. Hazy density surrounding the distal trachea to the bifurcation and extends to the subcarinal region, difficult to assess without contrast, possible edema, infection, or additional soft tissue mass. 4. No CT evidence for acute intra-abdominal or  pelvic abnormality. Punctate nonobstructing left kidney stone. Nonspecific left greater than right perinephric stranding, correlate with urinalysis. 5. Aortic atherosclerosis. Aortic Atherosclerosis (ICD10-I70.0). Electronically Signed   By: Esmeralda Hedge M.D.   On: 09/25/2023 20:25   DG Chest Port 1 View Result Date: 09/25/2023 CLINICAL DATA:  Questionable sepsis. EXAM: PORTABLE CHEST 1 VIEW COMPARISON:  Chest radiograph dated 07/29/2023. FINDINGS: Right-sided Port-A-Cath with tip at the cavoatrial junction. Shallow inspiration with bibasilar atelectasis. No pleural effusion pneumothorax. The cardiac silhouette is within limits. Tracheostomy approximately 5 cm above the carina. No acute osseous pathology. IMPRESSION: Shallow inspiration with bibasilar atelectasis. Electronically Signed   By: Angus Bark M.D.   On: 09/25/2023 15:34        Scheduled Meds:  Chlorhexidine  Gluconate Cloth  6 each Topical Daily   enoxaparin  (LOVENOX ) injection  40 mg Subcutaneous Q24H  insulin  aspart  0-15 Units Subcutaneous TID WC   insulin  aspart  0-5 Units Subcutaneous QHS   sodium chloride  flush  10-40 mL Intracatheter Q12H   Continuous Infusions:  azithromycin  500 mg (09/26/23 0119)   cefTRIAXone  (ROCEPHIN )  IV 2 g (09/26/23 0041)   lactated ringers  150 mL/hr (09/26/23 0044)     LOS: 1 day    Time spent: 45 minutes spent on chart review, discussion with nursing staff, consultants, updating family and interview/physical exam; more than 50% of that time was spent in counseling and/or coordination of care.    Enrigue Harvard, DO [facility] Available via Epic secure chat 7am-7pm After these hours, please refer to coverage provider listed on [email or web address] 09/26/2023, 12:46 PM

## 2023-09-26 NOTE — Consult Note (Signed)
 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 Upon arrival, patient was tachycardic, tachypnea, and hypoxic on 6L of O2 (baseline). Patient was on route to [facility] for treatment with carbo/taxol but was unable due to respiratory distress with hypoxia. Patient had a fever of 102, purulent tracheal secretions, WBC of 12.8, lactic 1.2, and hgb of 7.0. He was admitted under TRH service for concerns of sepsis secondary to pneumonia. TRH consulted PCCM on 5/20 due to abnormal CT scan of neck results-revealed extensive debris encompassing the tracheostomy affected by tumor which could be infectious debris vs blood clot, vs necrotic tumor or creation of a tracheoesophageal fistula.   Upon assessment, patient unable to give history due to tracheostomy and limited communication. However, patient on 10L via trache collar with O2 Sats 98%, RR 20s, SBP 110s-130s, temp 99.5 F.  Purulent tracheal secretions coming out of trache, as well as around trache site. Patient having pain upon touching surrounding tissue.   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- last radiation 06-2018   Cataract    removed both eyes with l;ens implants    COPD (chronic obstructive  pulmonary disease) (HCC)    Dyspnea    Family history of colon cancer    in his brother- dx'd age 80    History of chemotherapy    last 06-01-2018   History of kidney stones    History of radiation therapy    last radiation 06-2018   Hyperlipidemia    currently under control   Hypertension    Irregular heart beats    Lymphadenopathy    Pain, lower leg    Bilateral   Peripheral arterial disease (HCC)    Pre-diabetes    Red-green color blindness    RLS (restless legs syndrome)    Snores    Wears glasses      Significant Hospital Events: Including procedures, antibiotic start and stop dates in addition to other pertinent events   5/20 PCCM consulted: CT scan of neck results-revealed extensive debris encompassing the tracheostomy affected by tumor which could be infectious debris vs blood clot, vs necrotic tumor or creation of a tracheoesophageal fistula. ENT consulted, Transfer to [facility] for further airway evaluation   Interim History / Subjective:  Patient on 10 Liters trache collar, 98% O2 sat  Tachypnea, but no acute distress  Purulent tracheal secretions   Objective    Blood pressure 138/77, pulse (!) 119, temperature 99.5 F (37.5 C), temperature source Oral, resp. rate 20, height 6' (1.829 m), weight 99.8 kg, SpO2 95%.    FiO2 (%):  [40 %] 40 %   Intake/Output Summary (Last 24 hours) at 09/26/2023 1652 Last data filed at 09/26/2023 1505 Gross per  24 hour  Intake 2692.09 ml  Output 350 ml  Net 2342.09 ml   Filed Weights   09/25/23 2222  Weight: 99.8 kg    Examination: General: acute on chronic older male, lying in icu bed on trache/vent  HEENT: Normocephalic, PERRLA intact, purulent secretions at trache site, Pink dry mucous membranes  CV: s1,s2, RRR-sinus tach, no MRG, No JVD  pulm: clear, diminished, no distress on vent  Abs: bs active, soft  Extremities: no edema, no deformity, moves all extremities on command Skin: no rash  Neuro: Rass 0, follows commands,  orientation difficult to assess, cough gag reflex present  GU: deferred    Resolved problem list   Assessment and Plan  Acute on chronic respiratory failure with Hypoxia  Sepsis secondary to Multifocal PnA  Paratracheal mass s/p tracheostomy (08/03/23 at [facility]) in setting of Metastatic laryngeal squamous cell carcinoma hx  COPD  5/20 CT scan of neck results-revealed extensive debris encompassing the tracheostomy affected by tumor which could be infectious debris vs blood clot, vs necrotic tumor or creation of a tracheoesophageal fistula P:  Transfer to [facility] for further airway/tracheostomy evaluation. ENT consulted, appreciate assistance  Monitor oxygen requirements, currently on 10 Liter trache collar, O2 sat goal >92%  Received vanc and flagyl , continue vanc/add zosyn   Continue to follow respiratory and blood cultures  Keep NPO for now due to possible tracheoesophageal fistula risk, monitor CBG q 4  Supposed to receive treatment with carbol, taxol on 5/20 at Cancer center but developed respiratory distress, after airway evaluation- will need to reach out to Oncology depending on results of airway eval   Anemia of Chronic Disease Hgb 7.0  P: Trend CBC, if hgb decreases <7, transfuse  Continue to follow h/h  Monitor for signs of bleeding   CHF Essential HTN  HLD P: Hold antihypertensive agents for now Hold statin   GERD P: Protonix  IV    Best Practice (right click and "Reselect all SmartList Selections" daily)   Diet/type: NPO DVT prophylaxis LMWH Pressure ulcer(s): N/A GI prophylaxis: PPI Lines: N/A Foley:  N/A Code Status:  full code Last date of multidisciplinary goals of care discussion - discussed with patient in regards to transferring to [facility] for further tracheostomy/airway evaluation on 5/20   Labs   CBC: Recent Labs  Lab 09/22/23 1019 09/25/23 1352 09/26/23 0513  WBC 14.5* 19.7* 12.8*  NEUTROABS 11.1* 16.7*  --   HGB 8.4* 8.5* 7.0*  HCT 26.3*  28.3* 23.5*  MCV 82.2 87.1 89.0  PLT 335 458* 400    Basic Metabolic Panel: Recent Labs  Lab 09/22/23 1019 09/25/23 1352 09/26/23 0413  NA 133* 133* 134*  K 4.8 4.9 4.4  CL 92* 89* 95*  CO2 34* 32 30  GLUCOSE 183* 186* 136*  BUN 26* 29* 23  CREATININE 0.96 0.88 0.74  CALCIUM  9.5 9.3 8.7*  MG  --  2.1  --    GFR: Estimated Creatinine Clearance: 109.6 mL/min (by C-G formula based on SCr of 0.74 mg/dL). Recent Labs  Lab 09/22/23 1019 09/25/23 1352 09/25/23 1359 09/26/23 0513  WBC 14.5* 19.7*  --  12.8*  LATICACIDVEN  --   --  1.2  --     Liver Function Tests: Recent Labs  Lab 09/22/23 1019 09/25/23 1352 09/26/23 0413  AST 14* 15 10*  ALT 12 14 12   ALKPHOS 152* 197* 142*  BILITOT 0.3 0.6 0.5  PROT 8.2* 8.2* 6.6  ALBUMIN 3.4* 2.7* 2.1*   No results  for input(s): "LIPASE", "AMYLASE" in the last 168 hours. No results for input(s): "AMMONIA" in the last 168 hours.  ABG    Component Value Date/Time   HCO3 31.3 (H) 07/15/2023 0044   TCO2 40 (H) 07/02/2023 1901   O2SAT 67.6 07/15/2023 0044     Coagulation Profile: Recent Labs  Lab 09/25/23 1352 09/26/23 0413  INR 1.2 1.3*    Cardiac Enzymes: No results for input(s): "CKTOTAL", "CKMB", "CKMBINDEX", "TROPONINI" in the last 168 hours.  HbA1C: Hgb A1c MFr Bld  Date/Time Value Ref Range Status  07/25/2023 04:07 PM 8.9 (H) 4.8 - 5.6 % Final    Comment:    (NOTE) Pre diabetes:          5.7%-6.4%  Diabetes:              >6.4%  Glycemic control for   <7.0% adults with diabetes   07/05/2023 04:17 AM 7.2 (H) 4.8 - 5.6 % Final    Comment:    (NOTE)         Prediabetes: 5.7 - 6.4         Diabetes: >6.4         Glycemic control for adults with diabetes: <7.0     CBG: Recent Labs  Lab 09/26/23 0735 09/26/23 1122 09/26/23 1603  GLUCAP 127* 126* 163*    Review of Systems:     Past Medical History:  He,  has a past medical history of Allergy, Anemia, Arthritis, Blood transfusion without  reported diagnosis, Cancer (HCC), Cataract, COPD (chronic obstructive pulmonary disease) (HCC), Dyspnea, Family history of colon cancer, History of chemotherapy, History of kidney stones, History of radiation therapy, Hyperlipidemia, Hypertension, Irregular heart beats, Lymphadenopathy, Pain, lower leg, Peripheral arterial disease (HCC), Pre-diabetes, Red-green color blindness, RLS (restless legs syndrome), Snores, and Wears glasses.   Surgical History:   Past Surgical History:  Procedure Laterality Date   CATARACT EXTRACTION W/ INTRAOCULAR LENS  IMPLANT, BILATERAL     COLONOSCOPY     DIRECT LARYNGOSCOPY Right 02/03/2022   Procedure: DIRECT LARYNGOSCOPY WITH BIOPSY OF RIGHT FALSE VOCAL CORD;  Surgeon: Ammon Bales, MD;  Location: [facility] OR;  Service: ENT;  Laterality: Right;   dislodged salava stone     ESOPHAGOSCOPY WITH DILITATION N/A 07/19/2023   Procedure: ESOPHAGOSCOPY, WITH DILATION;  Surgeon: Janita Mellow, MD;  Location: [facility] OR;  Service: ENT;  Laterality: N/A;   FRACTURE SURGERY     HAND ARTHROPLASTY  1995   crushed left hand   INGUINAL LYMPH NODE BIOPSY Left 01/02/2018   Procedure: LEFT INGUINAL LYMPH NODE BIOPSY;  Surgeon: Enid Harry, MD;  Location: [facility] OR;  Service: General;  Laterality: Left;   IR IMAGING GUIDED PORT INSERTION  01/15/2018   IR IMAGING GUIDED PORT INSERTION  08/10/2022   IR REMOVAL TUN ACCESS W/ PORT W/O FL MOD SED  03/11/2019   IR REPLACE G-TUBE SIMPLE WO FLUORO  06/21/2023   LAPAROSCOPIC INSERTION GASTROSTOMY TUBE N/A 06/19/2023   Procedure: LAPAROSCOPIC INSERTION GASTROSTOMY TUBE;  Surgeon: Junie Olds, MD;  Location: [facility] ORS;  Service: General;  Laterality: N/A;   LAPAROSCOPIC INSERTION GASTROSTOMY TUBE N/A 06/21/2023   Procedure: LAPAROSCOPIC INSERTION GASTROSTOMY TUBE;  Surgeon: Junie Olds, MD;  Location: [facility] ORS;  Service: General;  Laterality: N/A;   MEDIASTINOSCOPY N/A 07/22/2022   Procedure: MEDIASTINOSCOPY;  Surgeon: Zelphia Higashi,  MD;  Location: [facility] OR;  Service: Thoracic;  Laterality: N/A;   MICROLARYNGOSCOPY Left 01/17/2014   Procedure: MICROLARYNGOSCOPY WITH EXCISION OF THE  BIOPSY OF LEFT VOCAL CORD LESION;  Surgeon: Janita Mellow, MD;  Location:  SURGERY CENTER;  Service: ENT;  Laterality: Left;   MICROLARYNGOSCOPY N/A 09/16/2020   Procedure: MICROLARYNGOSCOPY with Biopsy of vocal cord lesion;  Surgeon: Ammon Bales, MD;  Location: [facility] OR;  Service: ENT;  Laterality: N/A;   ORIF FOOT FRACTURE  2005   left   REFRACTIVE SURGERY Right    removed cloudiness in right eye after cataract removal      Social History:   reports that he quit smoking about 2 years ago. His smoking use included cigarettes. He started smoking about 39 years ago. He has a 18 pack-year smoking history. He has never used smokeless tobacco. He reports that he does not currently use alcohol after a past usage of about 6.0 standard drinks of alcohol per week. He reports current drug use. Drugs: , Marijuana, and Cocaine.   Family History:  His family history includes Breast cancer in his mother and sister; Colon cancer (age of onset: 53) in his brother; Diabetes in his father; Hypertension in his brother, daughter, and father; Mental illness in his daughter and sister; Stroke in his father. There is no history of Esophageal cancer, Colon polyps, Rectal cancer, or Stomach cancer.   Allergies Allergies  Allergen Reactions   Bee Venom Anaphylaxis   Lisinopril  Swelling and Other (See Comments)    Lips became very swollen- "Allergic," per MAR   Antifungal [Miconazole Nitrate] Other (See Comments)    Headaches- "Allergic," per MAR   Zolpidem Tartrate Er Other (See Comments)    Leg cramps     Home Medications  Prior to Admission medications   Medication Sig Start Date End Date Taking? Authorizing Provider  acetaminophen  (TYLENOL ) 160 MG/5ML solution Place 20.3 mLs (650 mg total) into feeding tube every 6 (six) hours as needed for mild pain  (pain score 1-3). 06/20/23  Yes Maczis, Michael M, PA-C  CHEST CONGESTION RELIEF 100 MG/5ML liquid Place 200 mg into feeding tube every 3 (three) hours as needed for cough or to loosen phlegm.   Yes [provider]  fentaNYL  (DURAGESIC ) 50 MCG/HR Place 1 patch onto the skin every other day.   Yes [provider]  ferrous sulfate  220 (44 Fe) MG/5ML solution Place 6.8 mLs (300 mg total) into feeding tube every other day. Patient taking differently: Place 300 mg into feeding tube daily with breakfast. 07/17/23  Yes Danford, Willis Harter, MD  gabapentin  (NEURONTIN ) 300 MG capsule Place 300 mg into feeding tube 2 (two) times daily.   Yes [provider]  glycopyrrolate  (ROBINUL ) 1 MG tablet Place 1 tablet (1 mg total) into feeding tube every other day. Crush tablet, place in water , and then place in feeding tube. Patient taking differently: Place 1 mg into feeding tube See admin instructions. Crush one 1 mg tablet, place in water , and into the feeding tube- ONCE A DAY AS NEEDED FOR EXCESS SECRETIONS 07/17/23  Yes Danford, Willis Harter, MD  hydrOXYzine  (ATARAX ) 25 MG tablet Place 25 mg into feeding tube every 6 (six) hours as needed for anxiety. 09/13/23  Yes [provider]  ipratropium-albuterol  (DUONEB) 0.5-2.5 (3) MG/3ML SOLN Take 3 mLs by nebulization every 6 (six) hours. 3 ml's, via nebulizer, every 6 hours and as needed for shortness of breath or wheezing 09/13/23  Yes [provider]  melatonin 3 MG TABS tablet Place 6 mg into feeding tube at bedtime. 09/13/23  Yes [provider]  Menthol-Zinc  Oxide (CALMOSEPTINE) 0.44-20.6 %  OINT Apply 1 application  topically See admin instructions. Apply a thin layer to sacral region for fragile skin- between incontinent episodes   Yes [provider]  Nutritional Supplements (FEEDING SUPPLEMENT, GLUCERNA 1.5 CAL,) LIQD Place 355 mLs into feeding tube See admin instructions. "355 ml's, per tube, four times a  day with FWF 30 ml's before and after each bolus and an additional 360 ml's water  three times a day for hydration needs (Total for day: 1,420 ml's/2,130 kCal)"   Yes [provider]  oxyCODONE  (ROXICODONE ) 5 MG/5ML solution Place 5 mLs (5 mg total) into feeding tube every 6 (six) hours as needed for breakthrough pain. Patient taking differently: Place 10 mg into feeding tube every 4 (four) hours as needed (for pain). 06/20/23  Yes Maczis, Michael M, PA-C  pantoprazole  sodium (PROTONIX ) 40 mg Place 40 mg into feeding tube daily.   Yes [provider]  polyethylene glycol (MIRALAX  / GLYCOLAX ) 17 g packet Place 17 g into feeding tube See admin instructions. Dissolve 17 grams of powder in 4-6 ounces of water  and administer, via tube, twice a day 09/13/23  Yes [provider]  sennosides (SENOKOT) 8.8 MG/5ML syrup Place 8.8 mg into feeding tube daily. 09/13/23  Yes [provider]  albuterol  (VENTOLIN  HFA) 108 (90 Base) MCG/ACT inhaler Inhale 2 puffs into the lungs every 6 (six) hours as needed for wheezing or shortness of breath. Patient not taking: Reported on 09/25/2023 07/05/23   Aisha Hove, MD  Blood Glucose Monitoring Suppl DEVI 1 each by Does not apply route in the morning, at noon, and at bedtime. May substitute to any manufacturer covered by patient's insurance. 07/31/23   Wouk, Haynes Lips, MD  insulin  glargine (LANTUS ) 100 UNIT/ML Solostar Pen Inject 15 Units into the skin at bedtime. Patient not taking: Reported on 09/22/2023 07/31/23   Wouk, Haynes Lips, MD  Insulin  Pen Needle (PEN NEEDLE/5-BEVEL TIP) 32G X 4 MM MISC 1 each by Does not apply route at bedtime. 07/31/23   Wouk, Haynes Lips, MD  Nutritional Supplements (NUTREN 1.5) LIQD Give 2 cartons 4 times a day.  Flush with 30ml of water  before and after each feeding.  Give additional 360ml of water  TID for additional hydration. Patient not taking: Reported on 09/25/2023 06/26/23   Frankie Israel, MD  Protein (FEEDING SUPPLEMENT, PROSOURCE TF20,) liquid Place 60 mLs into feeding tube daily. Patient not taking: Reported on 09/25/2023 07/06/23   Aisha Hove, MD     Critical care time: 40 mins       Christian Keylan Costabile AGACNP-BC   [facility] 09/26/2023, 4:51 PM  Please see [email or web address] for pager details.  From 7A-7P if no response, please call [phone]. After hours, please call ELink [phone].

## 2023-09-26 NOTE — Progress Notes (Signed)
 Fentanyl  patch 50mcg wasted with this nurse and Tamra Falling RN

## 2023-09-26 NOTE — Progress Notes (Signed)
   09/26/23 1042  Assess: MEWS Score  Temp 99.5 F (37.5 C)  BP 117/64  MAP (mmHg) 77  Pulse Rate (!) 114  Resp 20  Level of Consciousness Alert  SpO2 95 %  Assess: MEWS Score  MEWS Temp 0  MEWS Systolic 0  MEWS Pulse 2  MEWS RR 0  MEWS LOC 0  MEWS Score 2  MEWS Score Color Yellow  Assess: if the MEWS score is Yellow or Red  Were vital signs accurate and taken at a resting state? Yes  Does the patient meet 2 or more of the SIRS criteria? No  MEWS guidelines implemented  Yes, yellow  Assess: SIRS CRITERIA  SIRS Temperature  0  SIRS Respirations  0  SIRS Pulse 1  SIRS WBC 0  SIRS Score Sum  1

## 2023-09-26 NOTE — TOC Initial Note (Addendum)
 Transition of Care [facility]) - Initial/Assessment Note    Patient Details  Name: Jesus [name] MRN: [id number] Date of Birth: March 18, 1956  [facility]) CM/SW Contact:    Ruben Corolla, RN Phone Number: 09/26/2023, 1:14 PM  Clinical Narrative:d/c plan return back to [facility]-rep Angie aware.Trach w/collar,PEG-TF. Patient limited asst w/ADL's, & ambulating,has rw,PT cons.                 Expected d/c plan: Return [facility] Barriers to Discharge: Continued Medical Work up   Patient Goals and CMS Choice Patient states their goals for this hospitalization and ongoing recovery are:: Home CMS Medicare.gov Compare Post Acute Care list provided to:: Patient Choice offered to / list presented to : Patient [place] ownership interest in [facility].provided to:: Patient    Expected Discharge Plan and Services                                              Prior Living Arrangements/Services                       Activities of Daily Living   ADL Screening (condition at time of admission) Independently performs ADLs?: No Does the patient have a NEW difficulty with bathing/dressing/toileting/self-feeding that is expected to last >3 days?: No Does the patient have a NEW difficulty with getting in/out of bed, walking, or climbing stairs that is expected to last >3 days?: No Does the patient have a NEW difficulty with communication that is expected to last >3 days?: No Is the patient deaf or have difficulty hearing?: No Does the patient have difficulty seeing, even when wearing glasses/contacts?: No Does the patient have difficulty concentrating, remembering, or making decisions?: No  Permission Sought/Granted                  Emotional Assessment              Admission diagnosis:  Pneumonia [J18.9] Multifocal pneumonia [J18.9] Sepsis, due to unspecified organism, unspecified whether acute organ dysfunction present [facility])  [A41.9] Patient Active Problem List   Diagnosis Date Noted   Pneumonia 09/25/2023   GI bleed 07/29/2023   Pressure injury of skin 07/29/2023   Acute on chronic blood loss anemia 07/29/2023   Pseudohyponatremia 07/29/2023   Acute hypoxic respiratory failure (HCC) 07/15/2023   Type 2 diabetes mellitus with hyperglycemia (HCC) 07/15/2023   Mild protein-calorie malnutrition (HCC) 07/15/2023   Thrombocytosis 07/15/2023   Normocytic anemia 07/15/2023   Hyperkalemia 07/15/2023   Cervical lymphadenopathy 07/15/2023   Hardening of the aorta (main artery of the heart) (HCC) 07/15/2023   Grade I diastolic dysfunction 07/15/2023   Aortic atherosclerosis (HCC) 07/15/2023   Malnutrition of moderate degree 07/05/2023   COPD with acute exacerbation (HCC) 07/03/2023   SOB (shortness of breath) 07/03/2023   Elevated troponin 07/03/2023   SIRS (systemic inflammatory response syndrome) (HCC) 07/03/2023   Leukocytosis 07/03/2023   History of cocaine abuse (HCC) 07/03/2023   Acute respiratory failure with hypoxia (HCC) 07/02/2023   Protein-calorie malnutrition, severe 06/20/2023   Dysphagia 06/16/2023   CAP (community acquired pneumonia) 06/15/2023   Immunocompromised patient (HCC) 06/15/2023   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   Pain due to onychomycosis of toenail 10/25/2022   Port-A-Cath in place 09/05/2022   Head and neck  cancer (HCC) 08/04/2022   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) 07/05/2022   History of tobacco abuse 07/05/2022   Chronic pain 07/15/2021   Diabetes mellitus without complication (HCC) 07/15/2021   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   Gastroesophageal reflux disease without esophagitis 07/15/2021   History of lymphoma 07/15/2021   Lipoma of skin and subcutaneous tissue of trunk 07/15/2021   Mixed hyperlipidemia 07/15/2021   Prediabetes 07/15/2021   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   Tobacco dependence 07/15/2021   Trigger thumb of left hand 02/17/2021   Laryngeal cancer (HCC) 10/02/2020   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   Hoarseness 08/11/2020   Chronic pain of right knee 10/17/2018   Large cell (diffuse) non-Hodgkin's lymphoma (HCC) 05/10/2018   Bacteremia due to Escherichia coli    HTN (hypertension) 04/17/2018   RLS (restless legs syndrome) 04/17/2018   Abnormal LFTs 04/17/2018   Acute metabolic encephalopathy 04/16/2018   AKI (acute kidney injury) (HCC) 04/16/2018   Dehydration    Fever, unspecified    Sepsis due to pneumonia [facility])    Disorientation    Non-Hodgkin's lymphoma of skin (HCC) 04/03/2018   Hypokalemia    Hypomagnesemia    Anemia of chronic disease    Encounter for antineoplastic chemotherapy    At high risk of tumor lysis syndrome    Swelling of lower leg    Diffuse large B cell lymphoma (HCC) 01/15/2018   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HCC) 01/12/2018   Counseling regarding advance care planning and goals of care 01/12/2018   Bilateral leg pain 05/27/2014   PCP:  [name], MD Pharmacy:   [facility] [place], [place] - [address] [place]. [address] [place] [address] Phone: [phone] Fax: [phone]     Social Drivers of Health (SDOH) Social History: SDOH Screenings   Food Insecurity: Food Insecurity Present (09/25/2023)  Housing: High Risk (09/25/2023)  Transportation Needs: No Transportation Needs (09/25/2023)  Recent Concern: Transportation Needs - Unmet Transportation Needs (07/29/2023)  Utilities: Not At Risk (09/25/2023)  Depression (PHQ2-9): Low Risk  (11/09/2022)  Financial Resource Strain: Low Risk  (10/06/2020)  Social Connections: Moderately Isolated (09/25/2023)  Tobacco Use: Medium Risk (09/25/2023)   SDOH Interventions:     Readmission Risk Interventions    07/31/2023    4:28 PM 07/17/2023    3:08 PM 07/05/2023    11:49 AM  Readmission Risk Prevention Plan  Transportation Screening Complete Complete Complete  Medication Review Ocean[employer]) Complete Complete Complete  PCP or Specialist appointment within 3-5 days of discharge Complete Complete Complete  HRI or Home Care Consult Complete Complete Complete  SW Recovery Care/Counseling Consult Complete Complete Complete  Palliative Care Screening Not Applicable Not Applicable Not Applicable  Skilled Nursing Facility Not Applicable Not Applicable Not Applicable

## 2023-09-26 NOTE — Procedures (Signed)
 Bronchoscopy Procedure Note  Jesus [name]  [id number]  [birth year]  Date:09/26/23  Time:5:23 PM   Provider Performing:[place]ah Vandergriff C Felipe Horton   Procedure(s):  Flexible Bronchoscopy [phone])  Indication(s) Abnormal CT imaging  Consent Verbal from patient  Anesthesia Versed , fent   Time Out Verified patient identification, verified procedure, site/side was marked, verified correct patient position, special equipment/implants available, medications/allergies/relevant history reviewed, required imaging and test results available.   Sterile Technique Usual hand hygiene, masks, gowns, and gloves were used   Procedure Description Bronchoscope advanced through tracheostomy tube and into airway.    Distal end of tracheostomy tube 50% occluded from posterior tracheal tissue question if its lying in the beginnings of a TE fistula which would align with CT findings.  Copious foul smelling purulent secretions coming out from stoma even with suctioning in tube.  Worsening O2 need almost certainly from aspiration, tough situation, see consult note.  Complications/Tolerance None; patient tolerated the procedure well. Chest X-ray is not needed post procedure.   EBL Minimal   Specimen(s) None

## 2023-09-26 NOTE — Progress Notes (Signed)
 Initial Nutrition Assessment  DOCUMENTATION CODES:   Non-severe (moderate) malnutrition in context of chronic illness  INTERVENTION:   Continue home regimen: -Osmolite 1.5 @ 40 ml/hr, advance by 10 ml every 4 hours to goal of 80 ml/hr. -60 ml Prosource TF 20 daily -Provides 2960 kcals, 140g protein and 1463 ml H2O -Free water  recommendations: 175 ml every 4 hours (1050 ml)   NUTRITION DIAGNOSIS:   Moderate Malnutrition related to chronic illness, cancer and cancer related treatments as evidenced by mild fat depletion, mild muscle depletion, percent weight loss.  GOAL:   Patient will meet greater than or equal to 90% of their needs1.92*  MONITOR:   TF tolerance  REASON FOR ASSESSMENT:   Consult Enteral/tube feeding initiation and management  ASSESSMENT:   68 y.o. male with medical history significant of diffuse large B cell lymphoma, anemia of chronic disease, essential hypertension, history of laryngeal cancer, GERD, COPD, diastolic CHF, hyperlipidemia, who is status post tracheostomy.  Currently on chemotherapy being followed by oncology who was at the cancer center today for his chemotherapy which included carboplatin  and Taxol.  He initially was at [facility] where he had the paratracheal mass compressing his airway.  That was following radiation.  He had emergent tracheostomy with thoracic surgery.  He was started on chemo there and then transferred to [facility]. Admitted for bilateral pneumonia.  Patient in room, sister at bedside. Pt with trach, coughing a lot. Pt able to mouth responses, difficult to understand. Pt reports he is on a continuous feeding regimen at [facility]. He uses Osmolite 1.5 continuously for 24 hours. Was unable to tell me the rate he receives. Calculated a goal rate based on estimated needs.   Per weight records, pt has lost  37 lbs since 1/6 (14% wt loss x 4.5 months, significant for time frame).  Medications reviewed.  Labs reviewed: CBGs: 126-127 Low Na    NUTRITION - FOCUSED PHYSICAL EXAM:  Flowsheet Row Most Recent Value  Orbital Region Mild depletion  Upper Arm Region No depletion  Thoracic and Lumbar Region No depletion  Buccal Region Mild depletion  Temple Region Mild depletion  Clavicle Bone Region Mild depletion  Clavicle and Acromion Bone Region No depletion  Scapular Bone Region No depletion  Dorsal Hand No depletion  Patellar Region No depletion  Anterior Thigh Region No depletion  Posterior Calf Region No depletion  Edema (RD Assessment) None  Hair Reviewed  Eyes Reviewed  Mouth Reviewed  Skin Reviewed  Nails Reviewed       Diet Order:   Diet Order             Diet heart healthy/carb modified Room service appropriate? Yes; Fluid consistency: Thin  Diet effective now                   EDUCATION NEEDS:   No education needs have been identified at this time  Skin:  Skin Assessment: Skin Integrity Issues: Skin Integrity Issues:: Stage I Stage I: coccyx  Last BM:  PTA  Height:   Ht Readings from Last 1 Encounters:  09/25/23 6' (1.829 m)    Weight:   Wt Readings from Last 1 Encounters:  09/25/23 99.8 kg    BMI:  Body mass index is 29.84 kg/m.  Estimated Nutritional Needs:   Kcal:  2750-3150  Protein:  120-140g  Fluid:  2.7L/day   Arna Better, MS, RD, LDN Inpatient Clinical Dietitian Contact via Secure chat

## 2023-09-26 NOTE — Progress Notes (Signed)
 Pharmacy Antibiotic Note  Jesus [name] is a 68 y.o. male admitted on 09/25/2023 with pneumonia.  Pharmacy has been consulted for Vancomycin , Zosyn  dosing.  Plan: Zosyn  3.375g IV Q8H infused over 4hrs.  Vancomycin  2g IV x1 then 1500 mg IV q12h  (SCr rounded 0.8, est AUC 485) Measure Vanc levels as needed.  Goal AUC = 400 - 550  Follow up renal function, culture results, and clinical course.   Height: 6' (182.9 cm) Weight: 99.8 kg (220 lb) IBW/kg (Calculated) : 77.6  Temp (24hrs), Avg:98.6 F (37 C), Min:97.9 F (36.6 C), Max:99.5 F (37.5 C)  Recent Labs  Lab 09/22/23 1019 09/25/23 1352 09/25/23 1359 09/26/23 0413 09/26/23 0513  WBC 14.5* 19.7*  --   --  12.8*  CREATININE 0.96 0.88  --  0.74  --   LATICACIDVEN  --   --  1.2  --   --     Estimated Creatinine Clearance: 109.6 mL/min (by C-G formula based on SCr of 0.74 mg/dL).    Allergies  Allergen Reactions   Bee Venom Anaphylaxis   Lisinopril  Swelling and Other (See Comments)    Lips became very swollen- "Allergic," per [facility]   Antifungal [Miconazole Nitrate] Other (See Comments)    Headaches- "Allergic," per [facility]   Zolpidem Tartrate Er Other (See Comments)    Leg cramps    Antimicrobials this admission: 5/19 metronidazole  x1 5/19 Vancomycin  x1, resumed 5/20 >>  5/20 Azithromycin  >> 5/20 5/20 ceftriaxone  >> 5/20 5/20 Zosyn  >>    Microbiology results: 5/19 Resp panel: neg flu, covid, rsv 5/19 BCx: ngtd 5/20 Sputum: not acceptable for testing 5/20 Resp panel PCR: none detected 5/20 tracheal aspirate:     Thank you for allowing pharmacy to be a part of this patient's care.  Kendall Pauls PharmD, BCPS [facility] main pharmacy [phone] 09/26/2023 5:46 PM

## 2023-09-26 NOTE — Progress Notes (Signed)
 [facility] reported to [facility] RT that ordered sputum sample was not  collected before PT was transported.

## 2023-09-27 ENCOUNTER — Ambulatory Visit

## 2023-09-27 ENCOUNTER — Encounter: Payer: Self-pay | Admitting: Hematology

## 2023-09-27 DIAGNOSIS — J9621 Acute and chronic respiratory failure with hypoxia: Secondary | ICD-10-CM | POA: Diagnosis not present

## 2023-09-27 DIAGNOSIS — Z930 Tracheostomy status: Secondary | ICD-10-CM | POA: Diagnosis not present

## 2023-09-27 DIAGNOSIS — J189 Pneumonia, unspecified organism: Secondary | ICD-10-CM | POA: Diagnosis not present

## 2023-09-27 DIAGNOSIS — A419 Sepsis, unspecified organism: Secondary | ICD-10-CM | POA: Diagnosis not present

## 2023-09-27 LAB — BASIC METABOLIC PANEL WITH GFR
Anion gap: 13 (ref 5–15)
BUN: 18 mg/dL (ref 8–23)
CO2: 27 mmol/L (ref 22–32)
Calcium: 8.9 mg/dL (ref 8.9–10.3)
Chloride: 95 mmol/L — ABNORMAL LOW (ref 98–111)
Creatinine, Ser: 0.89 mg/dL (ref 0.61–1.24)
GFR, Estimated: >60 mL/min (ref >60)
Glucose, Bld: 110 mg/dL — ABNORMAL HIGH (ref 70–99)
Potassium: 4.3 mmol/L (ref 3.5–5.1)
Sodium: 135 mmol/L (ref 135–145)

## 2023-09-27 LAB — GLUCOSE, CAPILLARY
Glucose-Capillary: 107 mg/dL — ABNORMAL HIGH (ref 70–99)
Glucose-Capillary: 114 mg/dL — ABNORMAL HIGH (ref 70–99)
Glucose-Capillary: 160 mg/dL — ABNORMAL HIGH (ref 70–99)
Glucose-Capillary: 179 mg/dL — ABNORMAL HIGH (ref 70–99)
Glucose-Capillary: 86 mg/dL (ref 70–99)
Glucose-Capillary: 87 mg/dL (ref 70–99)
Glucose-Capillary: 98 mg/dL (ref 70–99)

## 2023-09-27 LAB — COMPREHENSIVE METABOLIC PANEL WITH GFR
ALT: 11 U/L (ref 0–44)
AST: 11 U/L — ABNORMAL LOW (ref 15–41)
Albumin: 1.9 g/dL — ABNORMAL LOW (ref 3.5–5.0)
Alkaline Phosphatase: 120 U/L (ref 38–126)
Anion gap: 12 (ref 5–15)
BUN: 16 mg/dL (ref 8–23)
CO2: 27 mmol/L (ref 22–32)
Calcium: 8.7 mg/dL — ABNORMAL LOW (ref 8.9–10.3)
Chloride: 95 mmol/L — ABNORMAL LOW (ref 98–111)
Creatinine, Ser: 0.85 mg/dL (ref 0.61–1.24)
GFR, Estimated: >60 mL/min (ref >60)
Glucose, Bld: 98 mg/dL (ref 70–99)
Potassium: 3.9 mmol/L (ref 3.5–5.1)
Sodium: 134 mmol/L — ABNORMAL LOW (ref 135–145)
Total Bilirubin: 0.7 mg/dL (ref 0.0–1.2)
Total Protein: 6.5 g/dL (ref 6.5–8.1)

## 2023-09-27 LAB — CBC
HCT: 23.3 % — ABNORMAL LOW (ref 39.0–52.0)
HCT: 22.9 % — ABNORMAL LOW (ref 39.0–52.0)
Hemoglobin: 7.2 g/dL — ABNORMAL LOW (ref 13.0–17.0)
Hemoglobin: 7 g/dL — ABNORMAL LOW (ref 13.0–17.0)
MCH: 26.6 pg (ref 26.0–34.0)
MCH: 26.4 pg (ref 26.0–34.0)
MCHC: 30.9 g/dL (ref 30.0–36.0)
MCHC: 30.6 g/dL (ref 30.0–36.0)
MCV: 86 fL (ref 80.0–100.0)
MCV: 86.4 fL (ref 80.0–100.0)
Platelets: 496 10*3/uL — ABNORMAL HIGH (ref 150–400)
Platelets: 451 10*3/uL — ABNORMAL HIGH (ref 150–400)
RBC: 2.71 MIL/uL — ABNORMAL LOW (ref 4.22–5.81)
RBC: 2.65 MIL/uL — ABNORMAL LOW (ref 4.22–5.81)
RDW: 17.8 % — ABNORMAL HIGH (ref 11.5–15.5)
RDW: 17.6 % — ABNORMAL HIGH (ref 11.5–15.5)
WBC: 14.8 10*3/uL — ABNORMAL HIGH (ref 4.0–10.5)
WBC: 13.2 10*3/uL — ABNORMAL HIGH (ref 4.0–10.5)
nRBC: 0 % (ref 0.0–0.2)
nRBC: 0 % (ref 0.0–0.2)

## 2023-09-27 LAB — MAGNESIUM: Magnesium: 1.8 mg/dL (ref 1.7–2.4)

## 2023-09-27 LAB — PHOSPHORUS: Phosphorus: 4.1 mg/dL (ref 2.5–4.6)

## 2023-09-27 MED ORDER — SENNA 8.6 MG PO TABS
1.0000 | ORAL_TABLET | Freq: Every day | ORAL | Status: DC
  Administered 2023-09-27 – 2023-10-13 (×9): 8.6 mg
  Filled 2023-09-27 (×17): qty 1

## 2023-09-27 MED ORDER — POLYETHYLENE GLYCOL 3350 17 G PO PACK
17.0000 g | PACK | Freq: Every day | ORAL | Status: DC
  Administered 2023-09-28 – 2023-10-04 (×3): 17 g
  Filled 2023-09-27 (×9): qty 1

## 2023-09-27 MED ORDER — MAGNESIUM SULFATE 2 GM/50ML IV SOLN
2.0000 g | Freq: Once | INTRAVENOUS | Status: AC
  Administered 2023-09-27: 2 g via INTRAVENOUS
  Filled 2023-09-27: qty 50

## 2023-09-27 MED ORDER — OSMOLITE 1.5 CAL PO LIQD
1000.0000 mL | ORAL | Status: DC
  Administered 2023-09-27 – 2023-10-11 (×17): 1000 mL
  Filled 2023-09-27 (×30): qty 1000

## 2023-09-27 MED ORDER — GABAPENTIN 250 MG/5ML PO SOLN
300.0000 mg | Freq: Two times a day (BID) | ORAL | Status: DC
Start: 2023-09-27 — End: 2023-10-13
  Administered 2023-09-27 – 2023-10-13 (×32): 300 mg
  Filled 2023-09-27 (×35): qty 6

## 2023-09-27 MED ORDER — POTASSIUM CHLORIDE 20 MEQ PO PACK
40.0000 meq | PACK | Freq: Once | ORAL | Status: AC
  Administered 2023-09-27: 40 meq
  Filled 2023-09-27: qty 2

## 2023-09-27 MED ORDER — PROSOURCE TF20 ENFIT COMPATIBL EN LIQD
60.0000 mL | Freq: Every day | ENTERAL | Status: DC
  Administered 2023-09-27 – 2023-10-13 (×17): 60 mL
  Filled 2023-09-27 (×17): qty 60

## 2023-09-27 NOTE — Progress Notes (Signed)
 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 ED due to respiratory distress. Upon arrival, patient was tachycardic, tachypnea, and hypoxic on 6L of O2 (baseline). Patient was on route to [facility] for treatment with carbo/taxol but was unable due to respiratory distress with hypoxia. Patient had a fever of 102, purulent tracheal secretions, WBC of 12.8, lactic 1.2, and hgb of 7.0. He was admitted under TRH service for concerns of sepsis secondary to pneumonia. TRH consulted PCCM on 5/20 due to abnormal CT scan of neck results-revealed extensive debris encompassing the tracheostomy affected by tumor which could be infectious debris vs blood clot, vs necrotic tumor or creation of a tracheoesophageal fistula.   Upon assessment, patient unable to give history due to tracheostomy and limited communication. However, patient on 10L via trache collar with O2 Sats 98%, RR 20s, SBP 110s-130s, temp 99.5 F.  Purulent tracheal secretions coming out of trache, as well as around trache site. Patient having pain upon touching surrounding tissue.   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- last radiation 06-2018   Cataract    removed both eyes with l;ens implants    COPD (chronic obstructive  pulmonary disease) (HCC)    Dyspnea    Family history of colon cancer    in his brother- dx'd age 58    History of chemotherapy    last 06-01-2018   History of kidney stones    History of radiation therapy    last radiation 06-2018   Hyperlipidemia    currently under control   Hypertension    Irregular heart beats    Lymphadenopathy    Pain, lower leg    Bilateral   Peripheral arterial disease (HCC)    Pre-diabetes    Red-green color blindness    RLS (restless legs syndrome)    Snores    Wears glasses      Significant Hospital Events: Including procedures, antibiotic start and stop dates in addition to other pertinent events   5/20 PCCM consulted: CT scan of neck results-revealed extensive debris encompassing the tracheostomy affected by tumor which could be infectious debris vs blood clot, vs necrotic tumor or creation of a tracheoesophageal fistula. ENT consulted, Transfer to [facility] for further airway evaluation   Interim History / Subjective:  Currently on trach collar.   Objective    Blood pressure (!) 141/78, pulse 94, temperature 98 F (36.7 C), temperature source Oral, resp. rate (!) 21, height 6' (1.829 m), weight 99.8 kg, SpO2 98%.    FiO2 (%):  [30 %-40 %] 30 %   Intake/Output Summary (Last 24 hours) at 09/27/2023 1435 Last data filed at 09/27/2023 1400 Gross per 24 hour  Intake 1755.71 ml  Output 700 ml  Net 1055.71  ml   Filed Weights   09/25/23 2222  Weight: 99.8 kg    Examination: General: acute on chronic older male, lying in icu bed on trach collar HEENT: Tracheostomy in place with foul-smell CV: HS normal pulm: Clear Abs: bs active, soft, PEG tube in place. Extremities: no edema, no deformity, moves all extremities on command Skin: no rash  Neuro: Rass 0, follows commands, orientation difficult to assess, cough gag reflex present  GU: deferred    Ancillary Tests Personally Reviewed:   Leukocytosis 13.2, PLT 451 HB 7.0 Creatinine  0.85  Assessment and Plan  Acute on chronic respiratory failure with Hypoxia  Sepsis secondary to Multifocal PnA  Paratracheal mass s/p tracheostomy (08/03/23 at [facility]) in setting of Metastatic laryngeal squamous cell carcinoma hx  COPD  Anemia of Chronic Disease CHF Essential HTN  HLD GERD  Plan:   - Continue current  antibiotics pending surgical plan from ENT. - Continue prior to admission medications.  -  Patient to remain to TCT   Best Practice (right click and "Reselect all SmartList Selections" daily)   Diet/type: NPO - resume feeds via PEG. DVT prophylaxis LMWH Pressure ulcer(s): N/A GI prophylaxis: PPI Lines: N/A Foley:  N/A Code Status:  full code Last date of multidisciplinary goals of care discussion - discussed with patient in regards to transferring to [facility] for further tracheostomy/airway evaluation on 5/20   Arlina Lair, MD [facility] ICU Physician [facility]  Pager: [phone] Or Epic Secure Chat After hours: [phone].  09/27/2023, 2:40 PM

## 2023-09-27 NOTE — Discharge Instructions (Addendum)
 [employer] assistance programs Crisis assistance programs  -[facility]elessness Arts development officer. If you are experiencing homelessness in [place], [place] , your first point of contact should be Pensions consultant. You can reach Coordinated Entry by calling ([phone] (254)[phone] or by emailing [email or web address].  Community access points: [employer] [phone] [address], [place]) every Tuesday from 9am-10am. [facility] ([address], [place]) every Wednesday from 8am-9am.   - Coordinated Re-entry Jayson Michael: Dial 211 and request. Offers referrals to homeless shelters in the area.    -The [employer] 8594377995) offers several services to local families, as funding allows. The Emergency Assistance Program (EAP), which they administer, provides household goods, free food, clothing, and financial aid to people in need in the [place]  area. The EAP program does have some qualification, and counselors will interview clients for financial assistance by written referral only. Referrals need to be made by the Department of Social Services or by other EAP approved human services agencies or charities in the area.  -Open Door Ministries of [employer], which can be reached at [phone], offers emergency assistance programs for those in need of help, such as food, rent assistance, a soup kitchen, shelter, and clothing. They are based in [facility] [place]  but provide a number of services to those that qualify for assistance.   [place] Department of Social Services may be able to offer temporary financial assistance and cash grants for paying rent and utilities, Help may be provided for local county residents who may be experiencing personal crisis when other resources, including government programs, are not available. Call [phone]  -High [employer] Army is a [employer] agency, The organization can offer emergency assistance for paying rent, Caremark Rx, utilities, food, household products and furniture. They offer extensive emergency and transitional housing for families, children and single women, and also run a Boy's and D[employer]. Thrift Shops, Secondary school teacher, and other aid offered too. [address], [place], [place]  [address], ([phone])[phone]  -[place] Low Income Energy Assistance Program -- This is offered for [facility] families. The federal government created [employer] Program provides a one-time cash grant payment to help eligible low-income families pay their electric and heating bills. [address] [place], [place]  [address], [phone]  -[place] Emergency Assistance -- A program offers emergency utility and rent funds for greater [employer] area residents. The program can also provide counseling and referrals to charities and government programs. Also provides food and a free meal program that serves lunch Mondays - Saturdays and dinner seven days per week to individuals in the community. [address], [place], [place]  [address], [phone]  -[employer] - Offers affordable apartment and housing communities across      [place] and [place]. The low income and seniors can access public housing, rental assistance to qualified applicants, and apply for the section 8 rent subsidy program. Other programs include Chiropractor and Engineer, maintenance. [address] [place], [place]  [address], dial [phone].  -The [facility] provides transitional housing to veterans and the disabled. Clients will also access other services too, including assistance in applying for Disability, life skills classes, case management, and assistance in finding permanent housing. [address], [place], [place]  [place] [address], call [phone]  -[place] Transitional Housing through [facility] is for people who were just  evicted or that are formerly homeless. The non-profit will also help then gain self-sufficiency, find a home or apartment to live in, and also provides information on rent assistance when needed. Phone [phone]  -The [place] [employer] [facility] helps low income, elderly, or disabled residents in seven counties in the [place] ([place], [place], [place], [place], [place], [place], [place], and [place]) save energy and reduce their utility bills by improving energy efficiency. Phone [phone].  -[employer] is located in the [place] [facility] in the [employer], [address], [place], [place] [address]. Parking is in the rear of the building. Phone: [phone]   General Email: Tonette Franco  GHC provides free housing counseling assistance in locating affordable rental housing or housing with support services for families and individuals in crisis and the chronically homeless. We provide potential resources for other housing needs like utilities. Our trained counselors also work with clients on budgeting and financial literacy in effort to empower them to take control of their financial situations. [employer] collaborates with homeless service providers and other stakeholders as part of the [employer] COC (Continuum of Care). The (COC) is a regional/local planning body that coordinates housing and services funding for homeless families and individuals. The role of GHC in the COC is through housing counseling to work with people we serve on diversion strategies for those that are at imminent risk of becoming homeless. We also work with the Coordinated Assessment/Entry Specialist who attempts to find temporary solutions and/or connects the people  to Housing First, Rapid Re-housing or transitional housing programs. Our Homelessness Prevention Housing Counselors meet with clients on business days (Monday-Fridays, except scheduled holidays) from 8:30 am to 4:30 pm.  Legal assistance for evictions, foreclosure, and more -If you need free legal advice on civil issues, such as foreclosures, evictions, Electronics engineer, government programs, domestic issues and more, Landscape architect of [place]  [facility]) is a Associate Professor firm that provides free legal services and counsel to lower income people, seniors, disabled, and others, The goal is to ensure everyone has access to justice and fair representation. Call them at ([phone].  [facility] for Housing and Community Studies can provide info about obtaining legal assistance with evictions. Phone ([phone].  Data processing manager  The [employer], [employer]. offers job and Dispensing optician. Resources are focused on helping students obtain the skills and experiences that are necessary to compete in today's challenging and tight job market. The non-profit faith-based community action agency offers internship trainings as well as classroom instruction. Classes are tailored to meet the needs of people in the [facility] region. [place], [place] [address], ([phone]  Foreclosure prevention/Debt Services Family Services of the [employer] Credit Counseling Service inludes debt and foreclosure prevention programs for local families. This includes money management, financial advice, budget review and development of a written action plan with a Pensions consultant to help solve specific individual financial problems. In addition, housing and mortgage counselors can also provide pre- and post-purchase homeownership counseling, default resolution counseling (to prevent foreclosure) and reverse mortgage counseling. A Debt Management Program allows  people and families with a high level of credit card or medical debt to consolidate and repay consumer debt and loans to creditors and rebuild positive credit ratings and scores. Contact ([phone] F1555895.  Community clinics in [place] -Health Department [facility]: [address] [place], [place], [address]. [phone].  -Health Department [place] [facility]: [phone]  [address], [facility], [address]. [phone].  -[facility] offers medical care through a group of doctors, pharmacies and other healthcare related agencies that offer services for low income, uninsured adults in [place]. Also offers adult Dental care and assistance with applying for an [employer]. Call [phone].   [facility]. This center provides low-cost health care to those without health insurance. Services offered include an onsite pharmacy. Phone [phone]. [address], [place].  -Medication Assistance Program serves as a link between pharmaceutical companies and patients to provide low cost or free prescription medications. This service is available for residents who meet certain income restrictions and have no insurance coverage. PLEASE CALL [phone] Jonette Nestle) OR [phone] ([place])  -One Step Further: Materials engineer, The [employer] Support & Nutrition Program, [employer]. Call ([phone]/ [phone].  Food pantry and assistance -Urban Ministry-Food Bank: [address], [place] [address]. Phone ([phone]  -Blessed Table Food Pantry: [address] [place], [place] [address]. [phone].  -Missionary Ministry: has the purpose of visiting the sick and shut-ins and provide for needs in the surrounding communities. Call [phone]. Email: [email or web address] This program provides: Food box for seniors, Financial assistance, Food to meet basic  nutritional needs.  -Meals on Wheels with Senior Resources: [facility] residents age 72 and over who are homebound and unable to obtain and prepare a nutritious meal for themselves are eligible for this service. There may be a waiting list in certain parts of [facility] if the route in that area is full. If you are in [facility] and [place] call [phone] to register. For all other areas call [phone] to register.  -Greater Dietitian: https://findfood.[email or web address]  TRANSPORTATION: -[employer] Department of Health: Call [facility] and [employer] at [phone] for details. AttractionGuides.es  -Access [place]: Access [place] is the [employer] Agency's shared-ride transportation service for eligible riders who have a disability that prevents them from riding the fixed route bus. Call [phone]. Access [place] riders must pay a fare of $1.50 per trip, or may purchase a 10-ride punch card for $14.00 ($1.40 per ride) or a 40-ride punch card for $48.00 ($1.20 per ride).  -The Shepherd's WHEELS rideshare transportation service is provided for senior citizens (60+) who live independently within [place] city limits and are unable to drive or have limited access to transportation. Call [phone] to schedule an appointment.  -[place] Transportation: For Medicare or Medicaid recipients call [phone]?[name], wheelchair Carloyn Chi, and ambulatory quotes available.   FLEEING VIOLENCE: -Family Services of the [place]- 24/7 Crisis line [phone]) -[facility] Justice Centers: (336) 641-SAFE [phone])  [place] 2-1-1 is another useful way to locate resources in the community. Visit ShedSizes.ch to find service information online. If you need additional assistance, 2-1-1 Referral Specialists are available 24 hours a day, every day by dialing  2-1-1 or [phone] from any phone. The call is free, confidential, and available in any language.  Affordable Housing Search http://www.nchousingsearch.[facility])   M-F 8a-3p [address]  [place], [place] [address] [phone] Services include: laundry, barbering, support groups, case management, phone & computer access, showers, AA/NA mtgs, mental health/substance abuse nurse, job skills class, disability information, VA assistance, spiritual classes, etc. Winter Shelter available when temperatures are less than 32 degrees.   HOMELESS SHELTERS [facility] at [facility]- Call [phone] ext. [phone]  or ext. [phone]. Located at [address] [place], [place] [address]  [facility] Mens Shelter- Call [phone]. Located at [address], [place] [address].  [facility]- [employer]. Call [phone]. Office located at [address], [place] [address].  Pathways Family Housing through [place] [phone].  [facility] Family Shelter- Call [phone]. Located at [address] [place], [place], [place] [address].  Room at the Inn-For Pregnant mothers. Call [phone]. Located at [address] [place], [address].  [place] [facility]-For men in [place]. Call [phone]. [facility]-Shelter in [place]. Call [phone].  Home of [employer] for [employer] [phone]. Office located at [address] [place], [address].  [employer] be agreeable to help with chores. Call [phone] ext. [phone].  Men's: [address] [place], [place] [address]. Women's: [facility]  [address] [place], [place] [address]  Crisis Services Therapeutic Alternatives Mobile Crisis Management- [phone]  [facility] [address] [place], [place] [address]. Phone: [phone] Rent/Utility Assistance in  [facility]:  INNOVATIVE PATHWAYS [address], [place], [place] [address] [phone] Mon 8:00am - 6:00pm; Tue 8:00am - 6:00pm; Wed 8:00am - 6:00pm; Thu 8:00am - 6:00pm; Fri 8:00am - 6:00pm; Email: [email or web address] Eligibility: Residents of [place], [place], [place], [place], [place] and [place] that meet income limits. Call or text for eligibility screening.   [facility] MIN[facility] [address] [place], [place], [place] [address] [phone] (Main: Rental Assistance) [phone] (Main: Utility Assistance) Mon 8:30am - 5:00pm; Tue 8:30am - 5:00pm; Wed 8:30am - 5:00pm; Thu 8:30am - 5:00pm; Fri 8:30am - 5:00pm; Website: http://www.greensborourbanministry.org/emergency-assistance-program Eligibility: People who have an unexpected crisis or emergency that can be verified. Must have some form of income and meet income limits. At the first of the month, only helps with rent/mortgage assistance for those who have court ordered eviction notices. Call for application information. Call for exact documents that will be needed. Examples of documents that may be needed: Photo ID, Social Security cards for everyone in the household, and proof of income for previous 2 months. Copy of eviction notice for rent assistance and copy of final notice for utility assistance. Statements or receipts of bills for previous 2 months.   [facility] - [place] [address] [place], [place] [address] [phone] (Main) [phone] (Alternate) Mon 9:00am - 5:00pm; Tue 9:00am - 5:00pm; Wed 9:00am - 5:00pm; Thu 9:00am - 5:00pm; Fri 9:00am - 5:00pm; Website: http://[email or web address].salvationarmy.org/[place]/emergency-financial-assistance Email: [email or web address] Eligibility: People experiencing a housing crisis with past-due rent and/or utilities and meet income limits. Must be willing to take part in 6 Call or visit website to download  application. Return complete application by mail or email only. Documents: Help with Utilities: Photo ID, proof of household income, copies of monthly bills or receipts, and a final disconnection/shut-off notice. Help with Rent or Mortgage: Photo ID, proof of income, copies of monthly bills or receipts, and eviction notice. Help with Household Goods: Photo ID, proof of household income, copies of monthly bills or receipts, and a fire or flood report.  [facility] [place] [address] [place], [place] [address] [phone] (Main) Mon 8:00am - 5:00pm; Tue 8:00am - 5:00pm; Wed 8:00am - 5:00pm; Thu 8:00am - 5:00pm; Fri 8:00am - 12:00pm; Website: http://[email or web address].salvationarmy.org/[place]/emergency-financial-assistance Email: [email or web address] Call for eligibility information. Apply :Utilities Assistance: Visit office by 8:30am on 1st and 4th Monday of each  month to pick up application. Rent and Mortgage Assistance: Visit office by 8:30am on 2nd and 3rd Monday of each month to pick up application. NOTE: If Monday falls on a holiday applications can be picked up the following Tuesday. Documents required will be listed on application.  [facility] [phone] (Main) Seen by appointment only. Call for more information. Eligibility: Meet income limits. Apply: Call for information on how to schedule an appointment. Each month there is a specific day to call to schedule an appointment. It is stated on the agency voicemail message. Appointments fill up quickly each month. Documents: Photo ID, copy of current utility bill.  [facility] [place] [address], [place], [place] [address] [phone] (Main) Tue 9:00am - 4:00pm; Wed 9:00am - 4:00pm; Thu 9:00am - 4:00pm; Website: http://www.[email or web address] Email: [email or web address]@gmail [email or web address] Eligibility: Utility Assistance: Meet income limits and be a Haematologist. [employer] customers do not qualify. Must not have received utility assistance for another agency within the last 90 days. Rent Assistance: Residents of [place] who meet income limits. Must not have received rent assistance for another agency within the last 90 days. Apply: Call to schedule an appointment. Documents: Utility Assistance: Photo ID, City of [employer], copy of lease (if not paying a mortgage), proof of income, and monthly expenses. Rent Assistance: Photo ID, W-9 from the landlord, copy of the lease, proof of income, and a list of monthly expenses.  OPEN [facility]OR MINISTRIES - [place] [address] [place], [place] [address] [phone] (Main: Help With Rent) [phone] (Main: Help With Utilities) Mon 9:00am - 4:00pm; Tue 9:00am - 4:00pm; Wed 9:00am - 4:00pm; Thu 9:00am - 4:00pm; Fri 9:00am - 4:00pm; Website: MotivationalSites.no Email: [email or web address] Eligibility: People experiencing a financial crisis. Apply: Call to schedule an appointment Wednesday, 7:30am. Documents: Photo ID, Social Security card, proof of income, and proof of address. Other documents may be required, depending on service. Call for more information.  LOW INCOME ENERGY ASSISTANCE PROGRAM DEPARTMENT OF SOCIAL SERVICES - [facility] [address] [place], [place] [address] [phone] (Main) Mon 8:00am - 5:00pm; Tue 8:00am - 5:00pm; Wed 8:00am - 5:00pm; Thu 8:00am - 5:00pm; Fri 8:00am - 5:00pm; Website: [email or web address] Eligibility: Meet income limits and resource guidelines. Each household is only eligible once, even if multiple members apply. Apply: Call to see if funds are available. Visit to complete an application, call to have 1 mailed, or apply online at [email or web address] NOTE: Households with a person age 15  and over or a person with a documented disability can apply beginning December 1. Other households can apply beginning January 1. Documents: Photo ID, birth certificate, proof of household income, copy of utility bill, latest bank statement, the names and Social Security numbers for everyone in the household, and proof of disability if under age 37.  LOW INCOME ENERGY ASSISTANCE PROGRAM DEPARTMENT OF SOCIAL SERVICES - [facility] [address] [place], [place], [place] [address] [phone] (Main) Mon 8:00am - 5:00pm; Tue 8:00am - 5:00pm; Wed 8:00am - 5:00pm; Thu 8:00am - 5:00pm; Fri 8:00am - 5:00pm; Website: [email or web address] Eligibility: Meet income limits and resource guidelines. Each household is only eligible once, even if multiple members apply. Apply: Call to see if funds are available. Visit to complete an application, call to have 1 mailed, or apply online at [email or web address] NOTE: Households with a person age 19 and over or a person with a documented  disability can apply beginning December 1. Other households can apply beginning January 1. Documents: Photo ID, birth certificate, proof of household income, copy of utility bill, latest bank statement, the names and Social Security numbers for everyone in the household, and proof of disability if under age 29.  [place]  [place] [facility] [employer]ISTRY Address: [address] [place], [place] [address] Phone Number: [phone] Hours of Operation: Residents of [place] can come to obtain food Monday through Friday from 8:30am until 3:30pm. Photo ID and Social Security cards required for all residents of a household. Can come six times a year  THE BLESSED TABLE Address: [address] , [place] [address] Phone Number: [phone] Hours of Operation: Operates Tuesday-Friday 10:00 a.m. to 1 p.m. Requirements: Referral from DSS needed. May come 6 times a  year, 30 days apart. Photo ID and SS required for all residents of household.  [facility] MIN[facility]ES Address: [address] [place], [place] [address] Phone Number: [phone] Hours of Operation: Food pantry is open on the last Saturday of each month from 10:00 am - 12:00 noon. No appointment needed. No qualifications.  [facility] Address: [address] , [place] [address] Phone Number: [phone]. [phone] Hours of Operation: Must make reservations to pick up food on Saturdays. Sign ups for Saturday pick up beginning at 8:30 a.m. on Monday morning.  [facility] [place] [facility] Address: [address] [place], [place] [address] Phone Number: [phone] Hours of Operation: If you need food, bring proper identification such as a driver's license to receive a bag of food once a month. Requirements: Can come once every 30 days with referral DSS, Holiday representative, Mental health etc. Each referral good for six visits. Photo ID required. *1st visit no referral required.  [facility] Address: [address] [place], [place] [address] Phone Number: [phone]  [place] [facility] Address: [address] [place], [place] [address] Phone Number: [phone] Hours of Operation:  You can register at [email or web address] for free groceries  FREE INDEED FOOD PANTRY Address: [address], [place] [address] Phone Number: [phone] Hours of Operation: Drive through giveaway, first come first served. Every 3rd Saturday 11AM - 1PM  [facility] [address] Address: [address] [place] [address] Phone Number: [phone]   [place]  HAND TO HAND FOOD PANTRY Address: [address] [place], [place] [address] Phone Number: [phone] Hours of Operation: Once a month every 3rd Saturday  [facility] Address: [address] [place], [place] [address] Phone Number: [phone] Hours of  Operation: Distribution happens from 9:00-10:00 a.m. every Saturday.     HELPING HANDS Address: [address] [place], [place] [address] Phone Number: [phone] Hours of Operation: ONCE a week for the community food distribution held every Tuesday, Wednesday and Thursday from 11 a.m. - 2:00 p.m. Food is available on a first come, first serve basis and varies week to week. No appointment necessary for drive thru pick up.  [facility] Address: [address] [place], [place] [address] Phone Number: [phone] Hours of Operation: Open every 3rd Thursday 9:30 a.m. - 11:00 a.m.  [facility] Address: [address] [place], [place] [address] Phone Number: [phone] Hours of Operation: Please call for hours, directions, and questions  [facility] [place] [facility] Address: [address], [place], [place]  [address] Phone Number: [phone] Website: https://www.Hollyguns.co.za Food Finder app: https://findfood.ghpfa.org  [employer] Address: [address]  [place], [place] [address] Phone Number: [phone] Hours of Operation: Contact Bree Harpe. Enrolled Substance Abuse Clients Only  [facility] Address: [address] [place], [address]  Phone Number: [phone] Hours of Operation: Contact Alene Ana. Food pantry open the 3rd Saturday of each month from 9 a.m. -12 p.m. only  [place] [facility] Address: [address] [place], [place] [address] Phone Number: [phone] Hours of Operation: Contact Loletta Ripple. Emergency food bank open on Saturdays by appointment only  [facility] Address: [address] [place], [place] [address] Phone Number: [phone] Hours of Operation: No specific contact person; Anyone can help  [facility] [facility] [employer] Address: [address] [place], [place] [address] Phone Number: [phone] Hours of Operation: Contact Julia Oats. Agency  gives out a bag of food every Thursday from 2-4 p.m. only, and also provides a community meal every Thursday between 5-6 p.m. Other services provided include rent/mortgage and utility assistance, women's winter shelter, thrift store, and senior adult activities.  [facility] [place] Address: [address] [place], [place] [address] Phone Number: [phone] Hours of Operation: The Emergency Food Assistance Program provides individuals and families with a generous supply of food including meat, fresh vegetables, and nonperishable items. The food box contains five days' worth of food, and each family or individual can receive a box once per month. M, W, Th, Fr 11am-2pm, walk-ins welcome.  [facility] Address: [address] [place], [place] [address]  Phone Number: [phone] Hours of Operation: Contact Asia Blanca Bunch. Tuesdays and Thursdays from 11am - 3pm by appointment only  Sunday, by APPOINTMENT ONLY  [address] of [place], [address], [address], [phone], 3.2 mi from [facility], call in advance for appointment at 10:00am or at 4:00pm, must provide valid photo ID  Monday  9:30am-5:00pm [facility], [address] [place] [phone], [phone], 0.9 mi from [facility], can come four times per year, bring your photo ID and SS cards for other residents of household, will make appointments for those who work and need to come after 5pm  10:00am-12:00noon [employer], [address] [place]  [place], [address], [phone], 1.7 mi from [facility], can come once every 60 days per household, need referral from DSS, [employer], etc., bring photo ID and SS card   10:00am-1:00pm [employer] [facility],  [address] [place], [address], [phone], 7.7 mi from [facility], can come once every thirty days with a referral from DSS, [employer], Mental  Health, etc. -- each referral good for six visits, bring photo ID   10:00am-1:00pm [address] [address], [phone], 4.2 mi from [facility], can come once every 6 months, open to [facility] residents, bring photo ID and copy of a current utility bill in your name, please call first to verify that food is available  6:30pm-8:30pm [facility], [address] [address], [phone], 3.2 mi from [facility], can come once every 30 days, maximum 6 times per year, bring your photo ID and SS numbers for other residents of household  Monday by APPOINTMENT ONLY  [facility], [address], [address],  [phone], 2.5 mi from [facility], call in advance for appointment between 10:00am-2:00pm, bring your photo ID and  SS cards for all residents of household, can come once every 3 months  [facility], [address], [address], [phone], 0.7 mi from [facility], call in advance for appointment, can come once every 30 days, bring your photo ID and SS cards for other residents of household   Tuesday  9:00am-12:00noon [employer], [address], [address], [phone], 1.3 mi from [facility], can come once every 3 months, bring your photo ID and SS numbers for other residents of household   9:00am-1:00pm [facility] [address],  [place], [address], [phone], 1.6 mi from [facility], can come once every two weeks  9:30am-5:00pm [employer], [address] [place] [phone], [phone], 0.9 mi from [facility], can come four times per year, bring your photo ID and SS cards for other residents of household, will make appointments for those who work and need to come after 5pm  10:00am-12:00noon [employer], [address] [place], [address], [phone], 1.7 mi from [facility], can come once every 60 days per household, need referral  from DSS, [employer], etc., bring photo ID and SS card  10:00am-1:00pm [facility], [id number],  [phone], 3.8 mi from [facility], with referral from DSS, may come six times, 30 days apart, bring your photo ID and SS cards for all residents of household   10:00am-1:00pm [address] the [facility], [address] [place], [address], [phone], 7.7 mi from [facility], can come once every thirty days with a referral from DSS, [employer], Mental Health, etc.- each referral good for six visits, bring photo ID   10:00am-1:00pm [address] [address], [phone], 4.2 mi from [facility], can come once every 6 months, open to [facility] residents, bring photo ID and copy of a current utility bill in your name, please call first to verify that food is available  2:00pm-3:30pm [facility], [address], [phone], [phone], 3.7 mi from [facility], can come twelve times per year, one bag per family, bring photo ID   FIRST AND THIRD Tuesdays  10:00am-1:00pm, [address] [place], [address], [phone], 7.2 mi from [facility], can come once every 30 days   Tuesday, WHEN FOOD IS AVAILABLE (call)  12:00noon-2:00pm [facility], [address], [address], [phone], 1.5 mi from  [facility], can come once every 30 days, bring photo ID   Tuesday, by APPOINTMENT ONLY  [facility], [address], [address],  [phone], 2.5 mi from [facility], call in advance for appointment between 1:00pm-4:00pm, bring your photo ID and SS cards for all residents of household, can come once every 3 months   [address] of [address], [address], [address], [phone], 5 mi from [facility], call one day ahead for appointment the next day between 10:00am and  12:00noon, can come once every 3 months, bring your photo ID and must qualify according to family income   [facility], [address] [address], [phone], 0.7 mi from [facility], call in advance for appointment, can come once every 30 days, bring your photo ID and SS cards for other residents of household  [address], [address], [address],  [phone], 5.1 mi from [facility], call between 9:00am and 1:00pm M-F to make appointment. Appointments are scheduled for Tues and Thurs from 10:00am-11:30am, bring photo ID, can come once every 6 months, limit three visits over 18 months, then must have referral  Wednesday  9:30am-5:00pm [facility], [address] [place] [phone], [phone], 0.9 mi from [facility], can come four times per year, bring your photo ID and SS cards for other residents of household, will make appointments for those who work and need to come after 5pm  9:30am-11:30am [employer] [facility], [address], [address], [phone], 6.6 mi from [facility], can come once every 30 days, bring your photo ID, and SS cards for other residents of household, each monthly visit requires a written referral from GUM or DSS with number in household on form  10:00am-12:00noon [address], [address] [place]  [place], [address], [phone], 1.7 mi from [facility], can come once every 60 days per household, need referral from DSS, [employer], etc., bring photo ID and SS card  10:00am-1:00pm [facility], [id number],  [phone], 3.9 mi from [facility], with referral from DSS, may come six times, 30 days apart, bring your photo ID and SS cards for all residents of household   10:00am-1:00pm [address] the [facility], [address] [place], [address], [phone], 7.7 mi from [facility], can come once every thirty days with a  referral from DSS, [employer], Mental Health, etc. - each referral good for six visits, bring photo ID   2:00pm-5:45pm [address] [place], [address], [phone], 3.2 mi from [facility], once every 30 days, first come/first served, limited to first 25, bring photo ID   6:30pm-8:30pm [facility], [address] [address], [phone], 3.2 mi from [facility], can come once every 30 days, maximum 6 times per year, bring your photo ID and SS numbers for other residents of household  FIRST and THIRD Wednesdays   9:00am-12:00noon, [facility], [address], [address], [phone], 4.2 mi from [facility], can come once a month. Please arrive and sign in no later than 11:15 so everyone can be served by 12 noon.  THIRD Wednesday  1:30pm-3:00pm, [address] [place], [address], [phone] or [phone], 2.1 mi from [facility]  Wednesday, by APPOINTMENT ONLY  [address] [facility], [address], [address], [phone], 5 mi from [facility], call one day ahead for appointment the next day between 10:00am and 12:00noon, can come once every 3 months, bring your photo ID and must qualify according to family income   [facility], [address] [address], [phone], 0.7 mi from [facility], call in advance for appointment, can come once every 30 days, bring your photo ID and SS cards for other residents of household   [address] of [place], [address], [address], [phone], 3.2 mi from [facility], call in advance for appointment at 7:00pm, must provide valid photo ID  Thursday  9:00am-12:00noon [employer], [address], [address], [phone], 1.3 mi from [facility], can come once every 3 months, bring your photo ID and SS  numbers for other residents of household   9:00am-1:00pm [facility] [address],  [place],  [address], [phone]/Ext 1, 1.6 mi from [facility], can come once every two weeks  9:30am-12:00noon [facility], [address], [address], [phone], 4.5 mi from [facility], can come once every 30 days, must state income [closed Thanksgiving and week of Christmas]  9:30am-5:00pm [employer], [address] [place] [phone], [phone], 0.9 mi from [facility], can come four times per year, bring your photo ID and SS card for other residents of household, will make appointments for those who work and need to come after 5pm  10:00am-1:00pm [employer], [address] [place], [address],  [phone], 3.9 mi from [facility], with referral from DSS, may come six times, 30 days apart, bring your photo ID and SS cards for all residents of household   10:00am-1:00pm [address] the [facility], [address] [place], [address], [phone], 7.7 mi from [facility], can come once every thirty days with a referral from DSS, [employer], Mental Health, etc.- each referral good for six visits, bring photo ID   10:00am-1:00pm [facility], [address], [address], [phone], 4.2 mi from [facility], can come once every 6 months, open to [facility] residents, bring photo ID and copy of a current utility bill in your name, please call first to verify that food is available   SUNDAYS BREAKFAST TWO LOCATIONS: 8:00am served in [facility] by Awaken PPL Corporation 8:30am SHUTTLE provided from [facility], served at [employer], [address] LUNCH TWO LOCATIONS [plus one additional third Sunday only] 10:30am - 12:30pm served at [employer], [employer], [place] [address] (1.2 miles from [facility]) 12:30pm served in [place] by Land O'Lakes Team (THIRD Sunday only) 1:30pm served at [facility] by [facility] one  location [plus one additional third Sunday only] 5:00pm Every Sunday, served under the bridge at [address] by Lige Reeve Under the 3M Company (.7 miles from [facility]) (THIRD Sunday ONLY) 4:00pm served in the parking garage, across from Nuc[employer] Corporation, corner of [place] and [place] by Ryland Group Works Ministries MONDAYS BREAKFAST 7:30am served in [employer] by the United States [employer] Corporation and Friends LUNCH 10:30am - 12:30pm served at [employer], [employer], [place] [address] (1.2 miles from [facility]) DINNER TWO LOCATIONS: 7:00pm served in front of the courthouse at the corner of Goldman Sachs and Phelps Dodge. by [place] Monday Night Meal (3 blocks from [facility]) 4:30pm served at the [employer]ation, [address] by Bank of New York Company (0.6 miles from [facility] ) TUESDAYS BREAKFAST 8:00am - 9:00am served at The TJX Companies, [address] (0.3 miles from [place]) LUNCH 10:30am - 12:30pm served at the [employer], [employer] [address], (1.2 miles from [place]) DINNER 6:00pm served at CSX Corporation, enter from Capital One and go to the Sonic Automotive, (0.7 miles from [place]) [facility] BREAKFAST 7:00am - 8:00am served at [employer], [employer] [address] (1.2 miles from [place]) LUNCH ONE LOCATION [plus two additional locations listed below] 10:30am - 12:30pm served at [employer], [employer] [address], (1.2 miles from [place]) (FIRST Wednesday ONLY) 11:30am served at Dillard's, [place] [address] (6.6 miles from [place]) (SECOND Wednesday ONLY) 11:00am served at [place]. [place] of [address], [address] (1.3 miles from [place]) [place] TWO LOCATIONS 6:00pm served at W. R. Berkley, [place] W. Visteon Corporation.  (1.3 miles from [facility]) 4:00pm - 6:00pm (hot dogs and chips) served at Levi Strauss of [facility], [address] (1.7 miles from [place]) [place] BREAKFAST NOT AVAILABLE AT THIS TIME LUNCH 10:30am - 12:30pm served at [employer], [employer], [place] [address], (1.2 miles from [place]) DINNER 6:00pm served at [employer] Corporation, enter from Capital One and go to the Sonic Automotive, (0.7 miles from Nucor Corporation) [place] BREAKFAST NOT AVAILABLE AT THIS TIME LUNCH 10:30am - 12:30pm served at [employer], [employer] [address] (1.2 miles from [place]) DINNER TWO LOCATIONS, [plus one additional first Friday only] 6:00pm served under the bridge at [address] by Lige Reeve Under [employer] Corporation. (.7 miles from [facility]) 5:00pm - 7:00pm served at Levi Strauss of [facility], [address] (1.7 miles from [place]) (FIRST Friday ONLY) 5:45 pm - SHUTTLE provided from the LIBRARY at 5:45pm. Served at [facility], [address]. SATURDAYS BREAKFAST TWO LOCATIONS [plus one additional last Saturday only] 8:00am served at [facility] by Delphi 8:30am served at [employer], [address] (2.2 miles from [facility]) (LAST Saturday ONLY) 8:30am served at [employer]azer Homes, [address] (5 miles from [place]) LUNCH 10:30am - 12:30pm served at [employer]col[employer], [employer] [address] (1.2 miles from [facility]) DINNER 6:00pm served under the bridge at [address] by World Fuel Services Corporation (0.7 miles from Nucor Corporation)  DIRECTIONS FROM [place] TO ALL MEAL LOCATIONS The Bridge at [address] (.7 miles from [address]) [address] on [place]. Turn Right onto DIRECTV 433  ft. Continue onto [address] under bridge, about 500 ft. Courthouse (3 blocks from [facility]) [place] on [address]. Turn right on [place]  1 block to PPL Corporation (.5 miles from Banks) [place] on [place]. YRC Worldwide. past Brink's Company to EMCOR. Enter from Capital One and go to the Affiliated Computer Services building W. R. Berkley [address] Visteon Corporation. (1.3 miles from [facility]) [address] on [place]. Turn Right onto [address]. Mel[address]. church will be on the Left. The TJX Companies [address] (.3 miles from [facility]) Go .3 miles on W. [address] Destination is on your right Dillard's at ONEOK (6.6 miles from [address]) [address] on [place] toward W Friend[address] Turn right onto W Friend[address] Continue onto Alcoa Inc. Continue onto Toll Brothers. 5. Latina Pol is on right Sanmina-SCI Futures trader) [address] (.6 miles from [place]) [place] on [place]. [address] Turn Left onto [address] 0.3 miles Destination is on the Left. [place] Black & Decker at American Express (5 miles from Nucor Corporation) 1. Head south on [address]. Turn right onto W  Friend[address] Turn slightly left onto Becton, Dickinson and Company onto Quest Diagnostics Turn right at Barnes & Noble Continue to church on right New Birth Sounds of [facility] [address] (1.7 miles from [place]) [address] on [place] 1.4 miles [place] becomes [place]. [address] Continue 0.6 miles and church will be on theright. [place] Guardian Life Insurance at [address] (2.5 miles from Nucor Corporation) [place] provided from Massachusetts Mutual Life Park] [place] on [place]. Elm toward Estée Lauder right onto Costco Wholesale left onto Emerson Electric Turn left onto Micron Technology [address] [place]  [address] (2.2 miles from [address]) [address] on [place] 1.4 miles [place] becomes [place]. Elm  [address] Turn right onto [address] [place]  [address] and church will be on the Left. Potter's House/[place] AT&T [address] (1.2 miles from [facility]) 1.Turn right onto [facility] [place] 2.Turn left onto [place] 3.[place] 4.Destination is on your right [place]. [place] Rh[place] of [address] at ToysRus (1.3 miles from [facility]) [address] on [address] Turn left onto Genuine Parts right onto S. [place]. Continue onto KB Home	[place]. Turn left onto Smurfit-Stone Container. Turn right onto WellPoint.

## 2023-09-27 NOTE — TOC Progression Note (Addendum)
 Transition of Care [facility]) - Progression Note    Patient Details  Name: Jesus [name] MRN: [id number] Date of Birth: Oct 04, 1955  [facility] CM/SW Contact  Juliane Och, LCSW Phone Number: 09/27/2023, 12:21 PM  Clinical Narrative:     12:21 PM CSW introduced self and role to patient. CSW followed up on SDOH needs (housing and food). Patient confirmed needs and CSW provided resources. [facility], [facility] and is able to return upon bed availability and medical readiness.  2:00 PM [facility] Liaison informed CSW that they may have a bed availabile for a discharge tomorrow. CSW informed medical team.  Expected Discharge Plan: Skilled Nursing Facility Barriers to Discharge: Continued Medical Work up  Expected Discharge Plan and Services In-house Referral: Clinical Social Work   Post Acute Care Choice: Skilled Nursing Facility Living arrangements for the past 2 months: Single Family Home, Skilled Nursing Facility                                       Social Determinants of Health (SDOH) Interventions SDOH Screenings   Food Insecurity: Food Insecurity Present (09/25/2023)  Housing: High Risk (09/25/2023)  Transportation Needs: No Transportation Needs (09/25/2023)  Recent Concern: Transportation Needs - Unmet Transportation Needs (07/29/2023)  Utilities: Not At Risk (09/25/2023)  Depression (PHQ2-9): Low Risk  (11/09/2022)  Financial Resource Strain: Low Risk  (10/06/2020)  Social Connections: Moderately Isolated (09/25/2023)  Tobacco Use: Medium Risk (09/25/2023)    Readmission Risk Interventions    07/31/2023    4:28 PM 07/17/2023    3:08 PM 07/05/2023   11:49 AM  Readmission Risk Prevention Plan  Transportation Screening Complete Complete Complete  Medication Review Oceanographer) Complete Complete Complete  PCP or Specialist appointment within 3-5 days of discharge Complete Complete Complete  HRI or Home Care Consult  Complete Complete Complete  SW Recovery Care/Counseling Consult Complete Complete Complete  Palliative Care Screening Not Applicable Not Applicable Not Applicable  Skilled Nursing Facility Not Applicable Not Applicable Not Applicable

## 2023-09-27 NOTE — Progress Notes (Signed)
 RT bronch used by ENT for bedside bronch.

## 2023-09-27 NOTE — Consult Note (Signed)
 ENT CONSULT:  Reason for Consult: Tracheostomy management  Referring Physician:  ICU  HPI: Jesus [name] is an 68 y.o. 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  He was currently undergoing palliative chemotherapy.  He did develop a significant paratracheal mass and had a tracheostomy placed by thoracic surgery at [facility].  There was some reports of this being changed to an extended length tracheostomy tube due to the anatomy.  He was doing okay up until yesterday when he became significantly hypoxic and went into respiratory distress.  He was sent to [facility] and eventually transferred to [facility] for management of his airway situation.  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  I was consulted to aid in tracheostomy management as he had been followed in the past most recently 4 months ago by Dr. Mariella Shore.  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- last radiation 06-2018   Cataract    removed both eyes with l;ens implants    COPD (chronic obstructive pulmonary disease) (HCC)    Dyspnea    Family history of colon cancer    in his brother- dx'd age 13    History of chemotherapy    last 06-01-2018   History of kidney stones    History of radiation therapy    last radiation 06-2018   Hyperlipidemia    currently under control   Hypertension    Irregular heart beats    Lymphadenopathy    Pain, lower leg    Bilateral   Peripheral arterial disease (HCC)    Pre-diabetes    Red-green color blindness    RLS (restless legs syndrome)    Snores    Wears glasses     Past Surgical History:  Procedure Laterality Date   CATARACT EXTRACTION W/  INTRAOCULAR LENS  IMPLANT, BILATERAL     COLONOSCOPY     DIRECT LARYNGOSCOPY Right 02/03/2022   Procedure: DIRECT LARYNGOSCOPY WITH BIOPSY OF RIGHT FALSE VOCAL CORD;  Surgeon: Ammon Bales, MD;  Location: [facility] OR;  Service: ENT;  Laterality: Right;   dislodged salava stone     ESOPHAGOSCOPY WITH DILITATION N/A 07/19/2023   Procedure: ESOPHAGOSCOPY, WITH DILATION;  Surgeon: Janita Mellow, MD;  Location: [facility] OR;  Service: ENT;  Laterality: N/A;   FRACTURE SURGERY     HAND ARTHROPLASTY  1995   crushed left hand   INGUINAL LYMPH NODE BIOPSY Left 01/02/2018   Procedure: LEFT INGUINAL LYMPH NODE BIOPSY;  Surgeon: Enid Harry, MD;  Location: [facility] OR;  Service: General;  Laterality: Left;   IR IMAGING GUIDED PORT INSERTION  01/15/2018   IR IMAGING GUIDED PORT INSERTION  08/10/2022   IR REMOVAL TUN ACCESS W/ PORT W/O FL MOD SED  03/11/2019   IR REPLACE G-TUBE SIMPLE WO FLUORO  06/21/2023   LAPAROSCOPIC INSERTION GASTROSTOMY TUBE N/A 06/19/2023   Procedure: LAPAROSCOPIC INSERTION GASTROSTOMY TUBE;  Surgeon: Junie Olds, MD;  Location: [facility] ORS;  Service: General;  Laterality: N/A;   LAPAROSCOPIC INSERTION GASTROSTOMY TUBE N/A 06/21/2023   Procedure: LAPAROSCOPIC INSERTION GASTROSTOMY TUBE;  Surgeon: Junie Olds, MD;  Location: [facility] ORS;  Service: General;  Laterality: N/A;   MEDIASTINOSCOPY N/A 07/22/2022   Procedure: MEDIASTINOSCOPY;  Surgeon: Zelphia Higashi, MD;  Location: [facility] OR;  Service: Thoracic;  Laterality: N/A;   MICROLARYNGOSCOPY Left 01/17/2014   Procedure: MICROLARYNGOSCOPY WITH EXCISION OF THE BIOPSY OF LEFT VOCAL CORD LESION;  Surgeon: Janita Mellow, MD;  Location: [facility];  Service: ENT;  Laterality: Left;   MICROLARYNGOSCOPY N/A 09/16/2020   Procedure: MICROLARYNGOSCOPY with Biopsy of vocal cord lesion;  Surgeon: Ammon Bales, MD;  Location: [facility] OR;  Service: ENT;  Laterality: N/A;   ORIF FOOT FRACTURE  2005   left   REFRACTIVE SURGERY Right    removed  cloudiness in right eye after cataract removal     Family History  Problem Relation Age of Onset   Breast cancer Mother    Diabetes Father    Hypertension Father    Stroke Father    Mental illness Sister    Hypertension Daughter    Mental illness Daughter    Hypertension Brother    Colon cancer Brother 74       passed away 11/11/2018   Breast cancer Sister    Esophageal cancer Neg Hx    Colon polyps Neg Hx    Rectal cancer Neg Hx    Stomach cancer Neg Hx     Social History:  reports that he quit smoking about 2 years ago. His smoking use included cigarettes. He started smoking about 39 years ago. He has a 18 pack-year smoking history. He has never used smokeless tobacco. He reports that he does not currently use alcohol after a past usage of about 6.0 standard drinks of alcohol per week. He reports current drug use. Drugs: , Marijuana, and Cocaine.  Allergies:  Allergies  Allergen Reactions   Bee Venom Anaphylaxis   Lisinopril  Swelling and Other (See Comments)    Lips became very swollen- "Allergic," per [facility]   Antifungal [Miconazole Nitrate] Other (See Comments)    Headaches- "Allergic," per [facility]   Zolpidem Tartrate Er Other (See Comments)    Leg cramps    Medications: I have reviewed the patient's current medications.  Results for orders placed or performed during the hospital encounter of 09/25/23 (from the past 48 hours)  Protime-INR     Status: Abnormal   Collection Time: 09/26/23  4:13 AM  Result Value Ref Range   Prothrombin Time 16.3 (H) 11.4 - 15.2 seconds   INR 1.3 (H) 0.8 - 1.2    Comment: (NOTE) INR goal varies based on device and disease states. Performed at [facility], [address], [place], [place] [address]   Cortisol-am, blood     Status: Abnormal   Collection Time: 09/26/23  4:13 AM  Result Value Ref Range   Cortisol - AM 22.8 (H) 6.7 - 22.6 ug/dL    Comment: Performed at [facility], [address], [place], [place]  [address]  Comprehensive metabolic panel     Status: Abnormal   Collection Time: 09/26/23  4:13 AM  Result Value Ref Range   Sodium 134 (L) 135 - 145 mmol/L   Potassium 4.4 3.5 - 5.1 mmol/L   Chloride 95 (L) 98 - 111 mmol/L   CO2 30 22 - 32 mmol/L   Glucose, Bld 136 (H) 70 - 99 mg/dL    Comment: Glucose reference range applies only to samples taken after fasting for at least 8 hours.   BUN 23 8 - 23 mg/dL   Creatinine, Ser 3.29  0.61 - 1.24 mg/dL   Calcium  8.7 (L) 8.9 - 10.3 mg/dL   Total Protein 6.6 6.5 - 8.1 g/dL   Albumin 2.1 (L) 3.5 - 5.0 g/dL   AST 10 (L) 15 - 41 U/L   ALT 12 0 - 44 U/L   Alkaline Phosphatase 142 (H) 38 - 126 U/L   Total Bilirubin 0.5 0.0 - 1.2 mg/dL   GFR, Estimated >83 >15 mL/min    Comment: (NOTE) Calculated using the CKD-EPI Creatinine Equation (2021)    Anion gap 9 5 - 15    Comment: Performed at [facility], [address], [place], [place] [address]  CBC     Status: Abnormal   Collection Time: 09/26/23  5:13 AM  Result Value Ref Range   WBC 12.8 (H) 4.0 - 10.5 K/uL   RBC 2.64 (L) 4.22 - 5.81 MIL/uL   Hemoglobin 7.0 (L) 13.0 - 17.0 g/dL    Comment: REPEATED TO VERIFY   HCT 23.5 (L) 39.0 - 52.0 %   MCV 89.0 80.0 - 100.0 fL   MCH 26.5 26.0 - 34.0 pg   MCHC 29.8 (L) 30.0 - 36.0 g/dL   RDW 07.3 (H) 71.0 - 62.6 %   Platelets 400 150 - 400 K/uL   nRBC 0.0 0.0 - 0.2 %    Comment: Performed at [facility], [address], [place], [place] [address]  Glucose, capillary     Status: Abnormal   Collection Time: 09/26/23  7:35 AM  Result Value Ref Range   Glucose-Capillary 127 (H) 70 - 99 mg/dL    Comment: Glucose reference range applies only to samples taken after fasting for at least 8 hours.  Expectorated Sputum Assessment w Gram Stain, Rflx to Resp Cult     Status: None   Collection Time: 09/26/23  9:03 AM   Specimen: Nasopharyngeal Swab; Sputum  Result Value Ref Range   Specimen Description EXPECTORATED SPUTUM     Special Requests NONE    Sputum evaluation      Sputum specimen not acceptable for testing.  Please recollect.   NOTIFIED WHITESELL, C  Performed at [facility], [address] [place], [place] [address]    Report Status 09/26/2023 FINAL   Respiratory (~20 pathogens) panel by PCR     Status: None   Collection Time: 09/26/23  9:36 AM   Specimen: Nasopharyngeal Swab; Respiratory  Result Value Ref Range   Adenovirus NOT DETECTED NOT DETECTED   Coronavirus 229E NOT DETECTED NOT DETECTED    Comment: (NOTE) The Coronavirus on the Respiratory Panel, DOES NOT test for the novel  Coronavirus (2019 nCoV)    Coronavirus HKU1 NOT DETECTED NOT DETECTED   Coronavirus NL63 NOT DETECTED NOT DETECTED   Coronavirus OC43 NOT DETECTED NOT DETECTED   Metapneumovirus NOT DETECTED NOT DETECTED   Rhinovirus / Enterovirus NOT DETECTED NOT DETECTED   Influenza A NOT DETECTED NOT DETECTED   Influenza B NOT DETECTED NOT DETECTED   Parainfluenza Virus 1 NOT DETECTED NOT DETECTED   Parainfluenza Virus 2 NOT DETECTED NOT DETECTED   Parainfluenza Virus 3 NOT DETECTED NOT DETECTED   Parainfluenza Virus 4 NOT DETECTED NOT DETECTED   Respiratory Syncytial Virus NOT DETECTED NOT DETECTED   Bordetella pertussis NOT DETECTED NOT DETECTED   Bordetella Parapertussis NOT DETECTED NOT DETECTED   Chlamydophila pneumoniae NOT DETECTED NOT DETECTED   Mycoplasma pneumoniae NOT DETECTED NOT DETECTED    Comment: Performed at [facility],  [address], [place], [place] [address]  Glucose, capillary     Status: Abnormal   Collection Time: 09/26/23 11:22 AM  Result Value Ref Range   Glucose-Capillary 126 (H) 70 - 99 mg/dL    Comment: Glucose reference range applies only to samples taken after fasting for at least 8 hours.  Glucose, capillary     Status: Abnormal   Collection Time: 09/26/23  4:03 PM  Result Value Ref Range   Glucose-Capillary 163 (H) 70 - 99 mg/dL    Comment: Glucose reference  range applies only to samples taken after fasting for at least 8 hours.   Comment 1 Notify RN    Comment 2 Document in Chart   Culture, Respiratory w Gram Stain     Status: None (Preliminary result)   Collection Time: 09/26/23  5:27 PM   Specimen: Tracheal Aspirate; Respiratory  Result Value Ref Range   Specimen Description TRACHEAL ASPIRATE    Special Requests NONE    Gram Stain      FEW WBC PRESENT, PREDOMINANTLY PMN FEW GRAM POSITIVE COCCI FEW GRAM NEGATIVE RODS    Culture      CULTURE REINCUBATED FOR BETTER GROWTH Performed at [facility], [address], [place], [place] [address]    Report Status PENDING   MRSA Next Gen by PCR, Nasal     Status: None   Collection Time: 09/26/23  5:48 PM  Result Value Ref Range   MRSA by PCR Next Gen NOT DETECTED NOT DETECTED    Comment: (NOTE) The GeneXpert MRSA Assay (FDA approved for NASAL specimens only), is one component of a comprehensive MRSA colonization surveillance program. It is not intended to diagnose MRSA infection nor to guide or monitor treatment for MRSA infections. Test performance is not FDA approved in patients less than 78 years old. Performed at [facility], [address], [place], [place] [address]   MRSA Next Gen by PCR, Nasal     Status: None   Collection Time: 09/26/23  6:31 PM   Specimen: Nasal Mucosa; Nasal Swab  Result Value Ref Range   MRSA by PCR Next Gen NOT DETECTED NOT DETECTED    Comment: (NOTE) The GeneXpert MRSA Assay (FDA approved for NASAL specimens only), is one component of a comprehensive MRSA colonization surveillance program. It is not intended to diagnose MRSA infection nor to guide or monitor treatment for MRSA infections. Test performance is not FDA approved in patients less than 65 years old. Performed at [facility], [address], [place], [place] [address]   Glucose, capillary     Status: Abnormal   Collection Time: 09/26/23  6:38 PM  Result  Value Ref Range   Glucose-Capillary 145 (H) 70 - 99 mg/dL    Comment: Glucose reference range applies only to samples taken after fasting for at least 8 hours.  Glucose, capillary     Status: Abnormal   Collection Time: 09/26/23  7:37 PM  Result Value Ref Range   Glucose-Capillary 118 (H) 70 - 99 mg/dL    Comment: Glucose reference range applies only to samples taken after fasting for at least 8 hours.  Glucose, capillary     Status: None   Collection Time: 09/26/23 11:34 PM  Result Value Ref Range   Glucose-Capillary 87 70 - 99 mg/dL    Comment: Glucose reference range applies only to samples taken after fasting for at least 8 hours.  Glucose, capillary     Status:  Abnormal   Collection Time: 09/27/23  3:25 AM  Result Value Ref Range   Glucose-Capillary 107 (H) 70 - 99 mg/dL    Comment: Glucose reference range applies only to samples taken after fasting for at least 8 hours.  Phosphorus     Status: None   Collection Time: 09/27/23  5:00 AM  Result Value Ref Range   Phosphorus 4.1 2.5 - 4.6 mg/dL    Comment: Performed at [facility], [address], [place], [place] [address]  Magnesium      Status: None   Collection Time: 09/27/23  5:00 AM  Result Value Ref Range   Magnesium  1.8 1.7 - 2.4 mg/dL    Comment: Performed at [facility], [address] [place], [place] [address]  Basic metabolic panel with GFR     Status: Abnormal   Collection Time: 09/27/23  5:00 AM  Result Value Ref Range   Sodium 135 135 - 145 mmol/L   Potassium 4.3 3.5 - 5.1 mmol/L   Chloride 95 (L) 98 - 111 mmol/L   CO2 27 22 - 32 mmol/L   Glucose, Bld 110 (H) 70 - 99 mg/dL    Comment: Glucose reference range applies only to samples taken after fasting for at least 8 hours.   BUN 18 8 - 23 mg/dL   Creatinine, Ser 0.86 0.61 - 1.24 mg/dL   Calcium  8.9 8.9 - 10.3 mg/dL   GFR, Estimated >57 >84 mL/min    Comment: (NOTE) Calculated using the CKD-EPI Creatinine Equation (2021)    Anion gap 13 5 -  15    Comment: Performed at [facility], [address] [place], [place] [address]  CBC     Status: Abnormal   Collection Time: 09/27/23  5:00 AM  Result Value Ref Range   WBC 14.8 (H) 4.0 - 10.5 K/uL   RBC 2.71 (L) 4.22 - 5.81 MIL/uL   Hemoglobin 7.2 (L) 13.0 - 17.0 g/dL   HCT 52.8 (L) 41.3 - 24.4 %   MCV 86.0 80.0 - 100.0 fL   MCH 26.6 26.0 - 34.0 pg   MCHC 30.9 30.0 - 36.0 g/dL   RDW 01.0 (H) 27.2 - 53.6 %   Platelets 496 (H) 150 - 400 K/uL   nRBC 0.0 0.0 - 0.2 %    Comment: Performed at [facility], [address] [place], [place] [address]  Glucose, capillary     Status: Abnormal   Collection Time: 09/27/23  7:15 AM  Result Value Ref Range   Glucose-Capillary 114 (H) 70 - 99 mg/dL    Comment: Glucose reference range applies only to samples taken after fasting for at least 8 hours.  Glucose, capillary     Status: None   Collection Time: 09/27/23 10:57 AM  Result Value Ref Range   Glucose-Capillary 98 70 - 99 mg/dL    Comment: Glucose reference range applies only to samples taken after fasting for at least 8 hours.  CBC     Status: Abnormal   Collection Time: 09/27/23  1:25 PM  Result Value Ref Range   WBC 13.2 (H) 4.0 - 10.5 K/uL   RBC 2.65 (L) 4.22 - 5.81 MIL/uL   Hemoglobin 7.0 (L) 13.0 - 17.0 g/dL   HCT 47.4 (L) 25.9 - 56.3 %   MCV 86.4 80.0 - 100.0 fL   MCH 26.4 26.0 - 34.0 pg   MCHC 30.6 30.0 - 36.0 g/dL   RDW 87.5 (H)  11.5 - 15.5 %   Platelets 451 (H) 150 - 400 K/uL   nRBC 0.0 0.0 - 0.2 %    Comment: Performed at [facility], [address], [place], [place] [address]  Comprehensive metabolic panel     Status: Abnormal   Collection Time: 09/27/23  1:25 PM  Result Value Ref Range   Sodium 134 (L) 135 - 145 mmol/L   Potassium 3.9 3.5 - 5.1 mmol/L   Chloride 95 (L) 98 - 111 mmol/L   CO2 27 22 - 32 mmol/L   Glucose, Bld 98 70 - 99 mg/dL    Comment: Glucose reference range applies only to samples taken after fasting for at least 8 hours.    BUN 16 8 - 23 mg/dL   Creatinine, Ser 6.04 0.61 - 1.24 mg/dL   Calcium  8.7 (L) 8.9 - 10.3 mg/dL   Total Protein 6.5 6.5 - 8.1 g/dL   Albumin 1.9 (L) 3.5 - 5.0 g/dL   AST 11 (L) 15 - 41 U/L   ALT 11 0 - 44 U/L   Alkaline Phosphatase 120 38 - 126 U/L   Total Bilirubin 0.7 0.0 - 1.2 mg/dL   GFR, Estimated >54 >09 mL/min    Comment: (NOTE) Calculated using the CKD-EPI Creatinine Equation (2021)    Anion gap 12 5 - 15    Comment: Performed at [facility], [address] [place], [place] [address]  Glucose, capillary     Status: None   Collection Time: 09/27/23  3:04 PM  Result Value Ref Range   Glucose-Capillary 86 70 - 99 mg/dL    Comment: Glucose reference range applies only to samples taken after fasting for at least 8 hours.    CT Soft Tissue Neck W Contrast Result Date: 09/26/2023 CLINICAL DATA:  Shortness of breath EXAM: CT NECK WITH CONTRAST TECHNIQUE: Multidetector CT imaging of the neck was performed using the standard protocol following the bolus administration of intravenous contrast. RADIATION DOSE REDUCTION: This exam was performed according to the departmental dose-optimization program which includes automated exposure control, adjustment of the mA and/or kV according to patient size and/or use of iterative reconstruction technique. CONTRAST:  75mL OMNIPAQUE  IOHEXOL  300 MG/ML  SOLN COMPARISON:  06/15/2023 FINDINGS: Pharynx and larynx: Irregular masslike thickening involving the upper cervical esophagus and infraglottic airway with tracheostomy tube. Patchy gas bubbles encompass the tracheostomy tube in the airway at the thoracic inlet. This frothy material may reflect necrotic tumor, infection, or prior bleeding - no active hemorrhage is seen. Patient is certainly at risk for esophagotracheal fistula. The degree of tracheal narrowing is improved. Submucosal low-density thickening involving the supraglottic airway, symmetric and presumably related to radiotherapy. Salivary  glands: No acute finding Thyroid : Patchy low-density in the right thyroid  since prior, direct invasion of the left para median thyroid  suspected on comparison study. The cystic spaces could reflect progressive tumor with necrosis or infectious collections, they diffusely involve the right lobe. Lymph nodes: Multiple heterogeneous enhancing cervical lymph nodes in the left jugular chain which are more prominent than before, up to 14 mm in the left lower neck on 6:91 Vascular: Extrinsic narrowing of the lower left IJ. Porta catheter on the right in unremarkable position. No pseudoaneurysm seen. Limited intracranial: No acute finding Visualized orbits: No acute finding Mastoids and visualized paranasal sinuses: Clear Skeleton: Generalized cervical spine degeneration. Upper chest: Clear apical lungs IMPRESSION: Extensive debris like appearance encompassing the tracheostomy at the upper airway which is extensively affected by  tumor. This could reflect infectious debris, blood clot, and or necrotic tumor. Patient is certainly at risk for tracheoesophageal fistula given the extent of tumor. Evidence of direct left thyroid  invasion by tumor on prior study. Progressive low-density areas in the right lobe thyroid  which could be infiltrating tumor or infectious collections. Submucosal thickening of the supraglottic airway, symmetric and presumably treatment related. Malignant adenopathy in the left jugular chain. Electronically Signed   By: Ronnette Coke M.D.   On: 09/26/2023 09:18   CT CHEST ABDOMEN PELVIS WO CONTRAST Result Date: 09/25/2023 CLINICAL DATA:  Sepsis cancer EXAM: CT CHEST, ABDOMEN AND PELVIS WITHOUT CONTRAST TECHNIQUE: Multidetector CT imaging of the chest, abdomen and pelvis was performed following the standard protocol without IV contrast. RADIATION DOSE REDUCTION: This exam was performed according to the departmental dose-optimization program which includes automated exposure control, adjustment of the  mA and/or kV according to patient size and/or use of iterative reconstruction technique. COMPARISON:  CT 08/03/2023, 07/15/2023, PET CT 04/14/2023 FINDINGS: CT CHEST FINDINGS Cardiovascular: Limited evaluation without intravenous contrast. Right-sided central venous port tip at the cavoatrial region. No significant pericardial effusion. Nonaneurysmal aorta Mediastinum/Nodes: Interim tracheostomy tube. Abnormal gas and debris surrounding the tracheostomy tube, series 2, image 4 through 10. Tip of the tube terminates several cm above the carina. Tracheal deviation to the right from known mass lesion. Enlarged right paratracheal node measures 14 mm compared with 10 mm previously. Interval small volume fluid in the anterior mediastinum and generalized hazy appearance of the mediastinal fat compared to prior. Circumferential soft tissue mass/thickening at the included thoracic inlet surrounds the trachea. Mass appears slightly smaller compared with 07/15/2023 chest CT. Hazy density surrounding the distal trachea to the bifurcation and extends to the subcarinal region. Lungs/Pleura: Dense bilateral lower lobe consolidations with air bronchograms. Adjacent heterogeneous consolidations and ground-glass densities. No pleural effusion. Musculoskeletal: No acute osseous abnormality CT ABDOMEN PELVIS FINDINGS Hepatobiliary: No focal liver abnormality is seen. No gallstones, gallbladder wall thickening, or biliary dilatation. Pancreas: Unremarkable. No pancreatic ductal dilatation or surrounding inflammatory changes. Spleen: Normal in size without focal abnormality. Adrenals/Urinary Tract: Adrenal glands are stable in appearance. Bilateral renal cysts for which no imaging follow-up is recommended. Punctate nonobstructing left kidney stone. No hydronephrosis. Nonspecific left greater than right perinephric stranding. The bladder is unremarkable Stomach/Bowel: The stomach contains a gastrostomy tube in the distal body. There is no  dilated small bowel. No acute bowel wall thickening. Negative appendix. Vascular/Lymphatic: Aortic atherosclerosis. No enlarged abdominal or pelvic lymph nodes. Reproductive: Slightly enlarged prostate Other: Negative for pelvic effusion or free air. Small fat containing umbilical hernia. Musculoskeletal: Degenerative changes. No acute osseous abnormality. IMPRESSION: 1. Interim tracheostomy tube with abnormal appearance of gas and debris surrounding the tracheostomy tube. Findings could be secondary to focal perforation versus infection. Contrast-enhanced CT of the neck/upper chest could be obtained if patient able to tolerate. 2. Interval small volume fluid and stranding in the anterior mediastinum and generalized hazy appearance of the mediastinal fat compared to prior, which may be inflammatory or infectious in etiology. New dense bilateral lower lobe consolidations with air bronchograms, suspect for multifocal pneumonia and or aspiration. 3. Circumferential soft tissue mass/thickening at the included thoracic inlet surrounds the trachea. Left paratracheal mass appears slightly smaller compared with 07/15/2023 chest CT, but there is slight interval enlargement of the right paratracheal node. Hazy density surrounding the distal trachea to the bifurcation and extends to the subcarinal region, difficult to assess without contrast, possible edema, infection, or additional soft tissue  mass. 4. No CT evidence for acute intra-abdominal or pelvic abnormality. Punctate nonobstructing left kidney stone. Nonspecific left greater than right perinephric stranding, correlate with urinalysis. 5. Aortic atherosclerosis. Aortic Atherosclerosis (ICD10-I70.0). Electronically Signed   By: Esmeralda Hedge M.D.   On: 09/25/2023 20:25    [id number] other than stated per HPI  Blood pressure 113/76, pulse 90, temperature 98.1 F (36.7 C), temperature source Oral, resp. rate 20, height 6' (1.829 m), weight 99.8 kg, SpO2  96%.  PHYSICAL EXAM:  CONSTITUTIONAL: Resting in bed with significant hypersensitivity to suctioning of the tracheostomy but responsive to commands.   NECK: #5 proximal extended length tracheostomy tube in place with extensive frankly purulent secretion coming from the lumen of the tracheostomy tube and around the tube.   Bedside tracheobronchoscopy: He disposable flexible video bronchoscope was passed through the tracheostomy tube and once passed the extensive purulent secretions there did appear to be a web which was just below the distal aspect of the tip of the tracheostomy tube.  This covered approximately 60% of the diameter of the tracheostomy tube approximately 1 cm below the distal end.  Studies Reviewed: Recent CT neck showed extensive tumor in the paratracheal area and significant purulence and or tumor extension down the proximal and mid tracheal wall.  There appears to be a soft tissue web just below the tip of the tracheostomy tube several centimeters above the carina.  Assessment/Plan:  Respiratory failure Likely the respiratory failure was due to the extensive lower airway infection and purulent secretions which blocked the residual lumen below the tracheostomy tube.  Bronchoscopy today also did show a web just below the distal end of the tracheostomy tube.  I suspect that this is due to traumatic suctioning and secondary scarring.  Although this could be the changed out to a longer tracheostomy tube with a distal extension such as a Bivona trach, these do not have inner cannulas and given the extensive secretions he would be at high risk for mucous plugging.  Additionally given the extensive active infection his pulmonary reserve is also limited.  Currently his respiratory status is stable and I would recommend reevaluation in 1 to 2 days after the infection has been somewhat treated with antibiotics.  Also the lumen of the tracheostomy tube is relatively small and likely has extensive  tumor surrounding this, increasing the risk of airway compromise during the tracheostomy change which may require upsizing.  If ventilation becomes limited, more urgent tracheostomy change could be done.  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  Zach Sireen Halk MD [facility] ENT  09/27/2023, 4:57 PM

## 2023-09-27 NOTE — NC FL2 (Signed)
 [place]  MEDICAID FL2 LEVEL OF CARE FORM     IDENTIFICATION  Patient Name: Jesus [name] Birthdate: 11/06/1955 Sex: male Admission Date (Current Location): 09/25/2023  [facility] and [place] Number:  Producer, television/film/video and Address:  [facility], [address] [place], [place] [address]      Provider Number: [id number]  Attending Physician Name and Address:  Arlina Lair, MD  Relative Name and Phone Number:  Aneta Keepers; Sister; [phone]    Current Level of Care: Hospital Recommended Level of Care: Skilled Nursing Facility Prior Approval Number:    Date Approved/Denied:   PASRR Number: [id number]  Discharge Plan: SNF    Current Diagnoses: Patient Active Problem List   Diagnosis Date Noted   Multifocal pneumonia 09/25/2023   GI bleed 07/29/2023   Pressure injury of skin 07/29/2023   Acute on chronic blood loss anemia 07/29/2023   Pseudohyponatremia 07/29/2023   Acute hypoxic respiratory failure (HCC) 07/15/2023   Type 2 diabetes mellitus with hyperglycemia (HCC) 07/15/2023   Mild protein-calorie malnutrition (HCC) 07/15/2023   Thrombocytosis 07/15/2023   Normocytic anemia 07/15/2023   Hyperkalemia 07/15/2023   Cervical lymphadenopathy 07/15/2023   Hardening of the aorta (main artery of the heart) (HCC) 07/15/2023   Grade I diastolic dysfunction 07/15/2023   Aortic atherosclerosis (HCC) 07/15/2023   Malnutrition of moderate degree 07/05/2023   COPD with acute exacerbation (HCC) 07/03/2023   SOB (shortness of breath) 07/03/2023   Elevated troponin 07/03/2023   SIRS (systemic inflammatory response syndrome) (HCC) 07/03/2023   Leukocytosis 07/03/2023   History of cocaine abuse (HCC) 07/03/2023   Acute respiratory failure with hypoxia (HCC) 07/02/2023   Protein-calorie malnutrition, severe 06/20/2023   Dysphagia 06/16/2023   CAP (community acquired pneumonia) 06/15/2023   Immunocompromised patient (HCC) 06/15/2023   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) 11/11/2022   Pain due to onychomycosis of toenail 10/25/2022   Port-A-Cath in place 09/05/2022   Head and neck cancer (HCC) 08/04/2022   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) 07/05/2022   History of tobacco abuse 07/05/2022   Chronic pain 07/15/2021   Diabetes mellitus without complication (HCC) 07/15/2021   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   Gastroesophageal reflux disease without esophagitis 07/15/2021   History of lymphoma 07/15/2021   Lipoma of skin and subcutaneous tissue of trunk 07/15/2021   Mixed hyperlipidemia 07/15/2021   Prediabetes 07/15/2021   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   Tobacco dependence 07/15/2021   Trigger thumb of left hand 02/17/2021   Laryngeal cancer (HCC) 10/02/2020   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   Hoarseness 08/11/2020   Chronic pain of right knee 10/17/2018   Large cell (diffuse) non-Hodgkin's lymphoma (HCC) 05/10/2018   Bacteremia due to Escherichia coli    HTN (hypertension) 04/17/2018   RLS (restless legs syndrome) 04/17/2018   Abnormal LFTs 04/17/2018   Acute metabolic encephalopathy 04/16/2018   AKI (acute kidney injury) (HCC) 04/16/2018   Dehydration    Fever, unspecified    Sepsis due to pneumonia [facility])    Disorientation    Non-Hodgkin's lymphoma of skin (HCC) 04/03/2018   Hypokalemia    Hypomagnesemia    Anemia of chronic disease    Encounter for antineoplastic chemotherapy    At high risk of tumor lysis syndrome    Swelling of lower leg    Diffuse large B cell lymphoma (HCC) 01/15/2018   Diffuse large B-cell lymphoma of lymph nodes  of multiple regions (HCC) 01/12/2018   Counseling regarding advance care planning and goals of care 01/12/2018   Bilateral leg pain 05/27/2014    Orientation RESPIRATION BLADDER Height & Weight     Self, Time, Situation, Place  O2, Vent, Tracheostomy (10L oxygen; 30% FiO2)  Incontinent, External catheter Weight: 220 lb (99.8 kg) Height:  6' (182.9 cm)  BEHAVIORAL SYMPTOMS/MOOD NEUROLOGICAL BOWEL NUTRITION STATUS       (Gastrostomy/Enterostomy Percutaneous endoscopic gastrostomy (PEG) LLQ; Gastrostomy/Enterostomy Gastrostomy 22 Fr. LUQ) Diet (Please see discharge summary)  AMBULATORY STATUS COMMUNICATION OF NEEDS Skin   Extensive Assist Non-Verbally PU Stage and Appropriate Care (Pressure Injury 09/25/23 Coccyx Stage 1 - Intact skin with non-blanchable redness of a localized area usually over a bony prominence. in gluteal cleft and bottom of sacrum/coccyx)                       Personal Care Assistance Level of Assistance  Bathing, Feeding, Dressing Bathing Assistance: Maximum assistance Feeding assistance: Maximum assistance Dressing Assistance: Maximum assistance     Functional Limitations Info  Sight, Speech Sight Info: Impaired (R and L (Reading glasses))   Speech Info: Impaired Primary school teacher)    SPECIAL CARE FACTORS FREQUENCY  PT (By licensed PT), OT (By licensed OT), Speech therapy     PT Frequency: 5x OT Frequency: 5x     Speech Therapy Frequency: 3x      Contractures Contractures Info: Not present    Additional Factors Info  Code Status, Allergies, Suctioning Needs, Insulin  Sliding Scale Code Status Info: Full Code Allergies Info: Bee Venom; Lisinopril ; Antifungal (miconazole Nitrate); Zolpidem Tartrate Er   Insulin  Sliding Scale Info: Please see discharge summary   Suctioning Needs: Please see discharge summary   Current Medications (09/27/2023):  This is the current hospital active medication list Current Facility-Administered Medications  Medication Dose Route Frequency Provider Last Rate Last Admin   Chlorhexidine  Gluconate Cloth 2 % PADS 6 each  6 each Topical Daily Davida Espy, MD   6 each at 09/27/23 0500   enoxaparin  (LOVENOX ) injection 40 mg  40 mg Subcutaneous Q24H Garba, Mohammad L, MD   40 mg at 09/27/23 1055    fentaNYL  (DURAGESIC ) 50 MCG/HR 1 patch  1 patch Transdermal Q48H Vann, Jessica U, DO   1 patch at 09/26/23 1346   gabapentin  (NEURONTIN ) 250 MG/5ML solution 300 mg  300 mg Per Tube Q12H Agarwala, Ravi, MD   300 mg at 09/27/23 1055   insulin  aspart (novoLOG ) injection 0-9 Units  0-9 Units Subcutaneous Q4H Vann, Jessica U, DO   2 Units at 09/26/23 1700   morphine  (PF) 2 MG/ML injection 2 mg  2 mg Intravenous Q2H PRN Vann, Jessica U, DO   2 mg at 09/27/23 1224   ondansetron  (ZOFRAN ) injection 4 mg  4 mg Intravenous Q6H PRN Davida Espy, MD       oxyCODONE  (Oxy IR/ROXICODONE ) immediate release tablet 10 mg  10 mg Per Tube Q4H PRN Smith, Daniel C, MD   10 mg at 09/27/23 0532   pantoprazole  (PROTONIX ) injection 40 mg  40 mg Intravenous Daily Vann, Jessica U, DO   40 mg at 09/27/23 1055   piperacillin -tazobactam (ZOSYN ) IVPB 3.375 g  3.375 g Intravenous Q8H Shade, Christine E, RPH 12.5 mL/hr at 09/27/23 1056 3.375 g at 09/27/23 1056   polyethylene glycol (MIRALAX  / GLYCOLAX ) packet 17 g  17 g Per Tube Daily Agarwala, Ravi, MD       senna (SENOKOT)  tablet 8.6 mg  1 tablet Per Tube Daily Agarwala, Martha Slack, MD   8.6 mg at 09/27/23 1055   sodium chloride  flush (NS) 0.9 % injection 10-40 mL  10-40 mL Intracatheter Q12H Garba, Mohammad L, MD   10 mL at 09/27/23 1055   sodium chloride  flush (NS) 0.9 % injection 10-40 mL  10-40 mL Intracatheter PRN Davida Espy, MD       vancomycin  (VANCOREADY) IVPB 1500 mg/300 mL  1,500 mg Intravenous Q12H Shade, Christine E, RPH 150 mL/hr at 09/27/23 0700 Infusion Verify at 09/27/23 0700     Discharge Medications: Please see discharge summary for a list of discharge medications.  Relevant Imaging Results:  Relevant Lab Results:   Additional Information SS# [id number]  Juliane Och, LCSW

## 2023-09-27 NOTE — Progress Notes (Signed)
 Pharmacy Electrolyte Replacement  Recent Labs:  Recent Labs    09/27/23 0500 09/27/23 1325  K 4.3 3.9  MG 1.8  --   PHOS 4.1  --   CREATININE 0.89 0.85    Low Critical Values (K </= 2.5, Phos </= 1, Mg </= 1) Present: None  MD Contacted: N/A  Plan: Give magnesium  2 grams IV x 1 dose and Kcl 40 mEq per tube x 1 dose and recheck in AM  Juleen Oakland, PharmD PGY1 Pharmacy Resident 09/27/2023 2:29 PM

## 2023-09-27 NOTE — Evaluation (Signed)
 Physical Therapy Evaluation Patient Details Name: Jesus [name] MRN: [id number] DOB: February 22, 1956 Today's Date: 09/27/2023  History of Present Illness  Pt is 68 year old [facility] from the cancer center on  09/25/23 for hypoxia and tachycardia. Pt with sepsis due to PNA. 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 Pt transferred to [facility] on 09/26/23. Pt from [facility] where he has been undergoing rehab after hospitalization and emergent trach at [facility] on 08/03/23. PMH - trach, laryngeal CA, Gtube, lymphoma, HTN, copd, chf  Clinical Impression  Pt admitted with above diagnosis and presents to PT with functional limitations due to deficits listed below (See PT problem list). Pt needs skilled PT to maximize independence and safety. Today pt primarily limited by pain in throat, neck, and upper back. Pt reports he was ambulating at [facility]. With multiple medical issues expect dc destination will hinge on those issues as much or more than therapy issues.          If plan is discharge home, recommend the following: A lot of help with bathing/dressing/bathroom;A lot of help with walking and/or transfers;Assistance with cooking/housework;Assist for transportation;Help with stairs or ramp for entrance   Can travel by private vehicle        Equipment Recommendations Other (comment) (To be determined at next venue)  Recommendations for Other Services       Functional Status Assessment Patient has had a recent decline in their functional status and demonstrates the ability to make significant improvements in function in a reasonable and predictable amount of time.     Precautions / Restrictions Precautions Precautions: Fall;Other (comment) Recall of Precautions/Restrictions: Intact Precaution/Restrictions Comments: trach, gtube Restrictions Weight Bearing Restrictions Per Provider Order: No      Mobility  Bed Mobility Overal bed mobility: Needs Assistance Bed Mobility: Supine to Sit, Sit to  Supine     Supine to sit: Min assist, HOB elevated, Used rails Sit to supine: Min assist   General bed mobility comments: Assist to bring legs off of bed to sit up and to bring legs back up into bed to return to supine. Significant use of rail for pt to elevate trunk into sitting.    Transfers                   General transfer comment: Pt declined due to pain    Ambulation/Gait               General Gait Details: Declined due to pain  Stairs            Wheelchair Mobility     Tilt Bed    Modified Rankin (Stroke Patients Only)       Balance Overall balance assessment: Needs assistance Sitting-balance support: No upper extremity supported, Feet supported Sitting balance-Leahy Scale: Good                                       Pertinent Vitals/Pain Pain Assessment Pain Assessment: Faces Faces Pain Scale: Hurts whole lot Pain Location: throat, neck, upper back Pain Descriptors / Indicators: Grimacing, Guarding Pain Intervention(s): Limited activity within patient's tolerance, Premedicated before session, Monitored during session, Repositioned    Home Living Family/patient expects to be discharged to:: Other (Comment) Living Arrangements: Children                 Additional Comments: Pt from [facility]    Prior Function Prior Level  of Function : Needs assist             Mobility Comments: Difficult to obtain specifics due to communication issues due to trach and pt with pain. Was ambulating some at [facility] Assessment   Upper Extremity Assessment Upper Extremity Assessment: Overall WFL for tasks assessed    Lower Extremity Assessment Lower Extremity Assessment: Generalized weakness       Communication   Communication Communication: Impaired Factors Affecting Communication: Trach/intubated    Cognition Arousal: Alert Behavior During Therapy: WFL for tasks assessed/performed   PT -  Cognitive impairments: No apparent impairments                       PT - Cognition Comments: Pt typing on phone to communicate Following commands: Intact       Cueing Cueing Techniques: Verbal cues     General Comments General comments (skin integrity, edema, etc.): VSS on trach collar    Exercises     Assessment/Plan    PT Assessment Patient needs continued PT services  PT Problem List Decreased strength;Decreased activity tolerance;Decreased mobility;Decreased balance;Pain       PT Treatment Interventions DME instruction;Gait training;Functional mobility training;Therapeutic activities;Therapeutic exercise;Balance training;Patient/family education    PT Goals (Current goals can be found in the Care Plan section)  Acute Rehab PT Goals Patient Stated Goal: not stated PT Goal Formulation: With patient Time For Goal Achievement: 10/11/23 Potential to Achieve Goals: Good    Frequency Min 2X/week     Co-evaluation               AM-PAC PT "6 Clicks" Mobility  Outcome Measure Help needed turning from your back to your side while in a flat bed without using bedrails?: A Little Help needed moving from lying on your back to sitting on the side of a flat bed without using bedrails?: A Little Help needed moving to and from a bed to a chair (including a wheelchair)?: Total Help needed standing up from a chair using your arms (e.g., wheelchair or bedside chair)?: Total Help needed to walk in hospital room?: Total Help needed climbing 3-5 steps with a railing? : Total 6 Click Score: 10    End of Session Equipment Utilized During Treatment: Oxygen Activity Tolerance: Patient limited by pain Patient left: in bed;with call bell/phone within reach;with bed alarm set Nurse Communication: Mobility status PT Visit Diagnosis: Other abnormalities of gait and mobility (R26.89);Muscle weakness (generalized) (M62.81);Pain Pain - part of body:  (neck, throat and upper  back)    Time: 0900-0919 PT Time Calculation (min) (ACUTE ONLY): 19 min   Charges:   PT Evaluation $PT Eval Moderate Complexity: 1 Mod   PT General Charges $$ ACUTE PT VISIT: 1 Visit         [facility] PT Acute Rehabilitation Services Office [phone]   Pura [facility] 09/27/2023, 9:44 AM

## 2023-09-27 NOTE — Progress Notes (Signed)
 Nutrition Follow-up  DOCUMENTATION CODES:   Non-severe (moderate) malnutrition in context of chronic illness  INTERVENTION:  Continue home regimen: -Osmolite 1.5 @ 40 ml/hr, advance by 10 ml every 4 hours to goal of 80 ml/hr. -60 ml Prosource TF 20 daily -Provides 2960 kcals, 140g protein and 1463 ml H2O -Free water  recommendations: 175 ml every 4 hours (1050 ml) or per MD  Monitor magnesium , potassium, and phosphorus BID for at least 3 days, MD to replete as needed, as pt is at risk for refeeding syndrome given malnutrition.   NUTRITION DIAGNOSIS:  Moderate Malnutrition related to chronic illness, cancer and cancer related treatments as evidenced by mild fat depletion, mild muscle depletion, percent weight loss. - Ongoing  GOAL:  Patient will meet greater than or equal to 90% of their needs - Progressing  MONITOR:  TF tolerance  REASON FOR ASSESSMENT:  Consult Enteral/tube feeding initiation and management  ASSESSMENT:  68 y.o. male with medical history significant of diffuse large B cell lymphoma, anemia of chronic disease, essential hypertension, history of laryngeal cancer, GERD, COPD, diastolic CHF, hyperlipidemia, who is status post tracheostomy.  Currently on chemotherapy being followed by oncology who was at the cancer center today for his chemotherapy which included carboplatin  and Taxol.  He initially was at [facility] where he had the paratracheal mass compressing his airway.  That was following radiation.  He had emergent tracheostomy with thoracic surgery.  He was started on chemo there and then transferred to [facility]. Admitted for bilateral pneumonia.  5/19 - Admitted 5/20 - Bronchoscopy; transferred to [facility]  Spoke with RN, pt doing ok. Received a secure chat from pharmacy about restarting TF. Discussed free water  flush with MD, plan for standard flush at this time.  Will start tube feeds at slower rate given short duration TF ran yesterday.   RD reviewed hard chart on  unit, previous TF order was 2 cartons Nutren 1.5 - QID + ProSource daily.   Nutrition Related Medications: NovoLog  0-9 units q4h, Protonix , Potassium Chloride , Senna, IV magnesium  sulfate, IV antibiotics  Labs: Sodium 135, Potassium 4.3, BUN 18, Creatinine 0.89, Phosphorus 4.1, Magnesium  1.8, Hgb A1c 8.9 (07/25/23)  CBG: 87-163 mg/dL x 24 hrs   UOP: 865 mL + 2 unmeasured occurrences x 24 hrs  Diet Order:   Diet Order             Diet NPO time specified  Diet effective now                   EDUCATION NEEDS:   No education needs have been identified at this time  Skin:  Skin Assessment: Skin Integrity Issues: Skin Integrity Issues:: Stage I Stage I: coccyx  Last BM:  PTA  Height:  Ht Readings from Last 1 Encounters:  09/25/23 6' (1.829 m)   Weight:  Wt Readings from Last 1 Encounters:  09/25/23 99.8 kg   BMI:  Body mass index is 29.84 kg/m.  Estimated Nutritional Needs:  Kcal:  2750-3150 Protein:  120-140g Fluid:  2.7L/day   Doneta Furbish RD, LDN Clinical Dietitian

## 2023-09-28 DIAGNOSIS — J189 Pneumonia, unspecified organism: Secondary | ICD-10-CM | POA: Diagnosis not present

## 2023-09-28 DIAGNOSIS — A419 Sepsis, unspecified organism: Secondary | ICD-10-CM | POA: Diagnosis not present

## 2023-09-28 DIAGNOSIS — J9621 Acute and chronic respiratory failure with hypoxia: Secondary | ICD-10-CM | POA: Diagnosis not present

## 2023-09-28 DIAGNOSIS — Z930 Tracheostomy status: Secondary | ICD-10-CM | POA: Diagnosis not present

## 2023-09-28 LAB — MAGNESIUM: Magnesium: 2.1 mg/dL (ref 1.7–2.4)

## 2023-09-28 LAB — CBC WITH DIFFERENTIAL/PLATELET
Abs Immature Granulocytes: 0.07 10*3/uL (ref 0.00–0.07)
Basophils Absolute: 0 10*3/uL (ref 0.0–0.1)
Basophils Relative: 0 %
Eosinophils Absolute: 0 10*3/uL (ref 0.0–0.5)
Eosinophils Relative: 0 %
HCT: 24.6 % — ABNORMAL LOW (ref 39.0–52.0)
Hemoglobin: 7.5 g/dL — ABNORMAL LOW (ref 13.0–17.0)
Immature Granulocytes: 1 %
Lymphocytes Relative: 13 %
Lymphs Abs: 1.5 10*3/uL (ref 0.7–4.0)
MCH: 26.5 pg (ref 26.0–34.0)
MCHC: 30.5 g/dL (ref 30.0–36.0)
MCV: 86.9 fL (ref 80.0–100.0)
Monocytes Absolute: 0.9 10*3/uL (ref 0.1–1.0)
Monocytes Relative: 8 %
Neutro Abs: 9 10*3/uL — ABNORMAL HIGH (ref 1.7–7.7)
Neutrophils Relative %: 78 %
Platelets: 571 10*3/uL — ABNORMAL HIGH (ref 150–400)
RBC: 2.83 MIL/uL — ABNORMAL LOW (ref 4.22–5.81)
RDW: 17.5 % — ABNORMAL HIGH (ref 11.5–15.5)
WBC: 11.5 10*3/uL — ABNORMAL HIGH (ref 4.0–10.5)
nRBC: 0 % (ref 0.0–0.2)

## 2023-09-28 LAB — BASIC METABOLIC PANEL WITH GFR
Anion gap: 10 (ref 5–15)
BUN: 18 mg/dL (ref 8–23)
CO2: 28 mmol/L (ref 22–32)
Calcium: 8.7 mg/dL — ABNORMAL LOW (ref 8.9–10.3)
Chloride: 95 mmol/L — ABNORMAL LOW (ref 98–111)
Creatinine, Ser: 0.92 mg/dL (ref 0.61–1.24)
GFR, Estimated: >60 mL/min (ref >60)
Glucose, Bld: 224 mg/dL — ABNORMAL HIGH (ref 70–99)
Potassium: 4.3 mmol/L (ref 3.5–5.1)
Sodium: 133 mmol/L — ABNORMAL LOW (ref 135–145)

## 2023-09-28 LAB — GLUCOSE, CAPILLARY
Glucose-Capillary: 160 mg/dL — ABNORMAL HIGH (ref 70–99)
Glucose-Capillary: 186 mg/dL — ABNORMAL HIGH (ref 70–99)
Glucose-Capillary: 193 mg/dL — ABNORMAL HIGH (ref 70–99)
Glucose-Capillary: 211 mg/dL — ABNORMAL HIGH (ref 70–99)
Glucose-Capillary: 212 mg/dL — ABNORMAL HIGH (ref 70–99)
Glucose-Capillary: 227 mg/dL — ABNORMAL HIGH (ref 70–99)

## 2023-09-28 MED ORDER — ACETAMINOPHEN 325 MG PO TABS
650.0000 mg | ORAL_TABLET | Freq: Four times a day (QID) | ORAL | Status: DC
Start: 2023-09-28 — End: 2023-09-28
  Administered 2023-09-28 (×2): 650 mg
  Filled 2023-09-28 (×2): qty 2

## 2023-09-28 MED ORDER — ORAL CARE MOUTH RINSE
15.0000 mL | OROMUCOSAL | Status: DC
  Administered 2023-09-28 – 2023-10-13 (×56): 15 mL via OROMUCOSAL

## 2023-09-28 MED ORDER — NAPROXEN 250 MG PO TABS
500.0000 mg | ORAL_TABLET | Freq: Two times a day (BID) | ORAL | Status: DC
Start: 2023-09-28 — End: 2023-10-04
  Administered 2023-09-28 – 2023-10-03 (×11): 500 mg
  Filled 2023-09-28 (×12): qty 2

## 2023-09-28 MED ORDER — ACETAMINOPHEN 160 MG/5ML PO SOLN
650.0000 mg | Freq: Four times a day (QID) | ORAL | Status: DC
  Administered 2023-09-28 – 2023-10-13 (×59): 650 mg
  Filled 2023-09-28 (×60): qty 20.3

## 2023-09-28 MED ORDER — VANCOMYCIN HCL 1.25 G IV SOLR
1250.0000 mg | Freq: Two times a day (BID) | INTRAVENOUS | Status: DC
  Administered 2023-09-28 – 2023-09-29 (×2): 1250 mg via INTRAVENOUS
  Filled 2023-09-28 (×3): qty 25

## 2023-09-28 MED ORDER — ORAL CARE MOUTH RINSE: 15.0000 mL | OROMUCOSAL | Status: DC | PRN

## 2023-09-28 MED ORDER — INSULIN GLARGINE-YFGN 100 UNIT/ML ~~LOC~~ SOLN
5.0000 [IU] | Freq: Every day | SUBCUTANEOUS | Status: DC
  Administered 2023-09-28 – 2023-10-13 (×16): 5 [IU] via SUBCUTANEOUS
  Filled 2023-09-28 (×16): qty 0.05

## 2023-09-28 NOTE — TOC Progression Note (Signed)
 Transition of Care [facility]) - Progression Note    Patient Details  Name: Jesus [name] MRN: [id number] Date of Birth: 13-Sep-1955  [facility]) CM/SW Contact  Juliane Och, LCSW Phone Number: 09/28/2023, 11:24 AM  Clinical Narrative:     11:24 AM Per progressions, patient is not medically stable for discharge at this time. Patient is expected to discharge to [facility] STR with a PEG. Palliative care has been consulted.  Expected Discharge Plan: Skilled Nursing Facility Barriers to Discharge: Continued Medical Work up  Expected Discharge Plan and Services In-house Referral: Clinical Social Work   Post Acute Care Choice: Skilled Nursing Facility Living arrangements for the past 2 months: Single Family Home, Skilled Nursing Facility                                       Social Determinants of Health (SDOH) Interventions SDOH Screenings   Food Insecurity: Food Insecurity Present (09/25/2023)  Housing: High Risk (09/25/2023)  Transportation Needs: No Transportation Needs (09/25/2023)  Recent Concern: Transportation Needs - Unmet Transportation Needs (07/29/2023)  Utilities: Not At Risk (09/25/2023)  Depression (PHQ2-9): Low Risk  (11/09/2022)  Financial Resource Strain: Low Risk  (10/06/2020)  Social Connections: Moderately Isolated (09/25/2023)  Tobacco Use: Medium Risk (09/25/2023)    Readmission Risk Interventions    07/31/2023    4:28 PM 07/17/2023    3:08 PM 07/05/2023   11:49 AM  Readmission Risk Prevention Plan  Transportation Screening Complete Complete Complete  Medication Review Oceanographer) Complete Complete Complete  PCP or Specialist appointment within 3-5 days of discharge Complete Complete Complete  HRI or Home Care Consult Complete Complete Complete  SW Recovery Care/Counseling Consult Complete Complete Complete  Palliative Care Screening Not Applicable Not Applicable Not Applicable  Skilled Nursing Facility Not Applicable Not  Applicable Not Applicable

## 2023-09-28 NOTE — Progress Notes (Signed)
 eLink Physician-Brief Progress Note Patient Name: Jesus [name] DOB: [birth year] MRN: [id number]   Date of Service  09/28/2023  HPI/Events of Note  CBG continues to rise and tube feeding goal is 80 - It's at 70 right now, and the last sugar was 227, on sliding scale insulin  without long-acting insulin   eICU Interventions  Add 5 units of Lantus  daily     Intervention Category Intermediate Interventions: Hyperglycemia - evaluation and treatment  Jesus [name] 09/28/2023, 5:06 AM

## 2023-09-28 NOTE — Progress Notes (Signed)
 Pharmacy Antibiotic Note  Jesus [name] is a 68 y.o. male admitted on 09/25/2023 with pneumonia.  Pharmacy has been consulted for Vancomycin , Zosyn  dosing.  Patient remains on Zosyn  and vancomycin . Scr has increased to 0.92 and naproxen  500 mg BID was added today so will decrease vancomycin  dose to avoid unnecessary renal injury.   Plan: Zosyn  3.375g IV Q8H infused over 4hrs.  Decrease vancomycin  to 1250 mg IV q12h  (SCr rounded 0.92, Vd 0.72, eAUC 461) Measure Vanc levels as needed.  Goal AUC = 400 - 550  Follow up renal function, culture results, and clinical course.   Height: 6' (182.9 cm) Weight: 84.3 kg (185 lb 13.6 oz) IBW/kg (Calculated) : 77.6  Temp (24hrs), Avg:98.3 F (36.8 C), Min:98 F (36.7 C), Max:98.5 F (36.9 C)  Recent Labs  Lab 09/25/23 1352 09/25/23 1359 09/26/23 0413 09/26/23 0513 09/27/23 0500 09/27/23 1325 09/28/23 0328 09/28/23 0339  WBC 19.7*  --   --  12.8* 14.8* 13.2*  --  11.5*  CREATININE 0.88  --  0.74  --  0.89 0.85 0.92  --   LATICACIDVEN  --  1.2  --   --   --   --   --   --     Estimated Creatinine Clearance: 85.5 mL/min (by C-G formula based on SCr of 0.92 mg/dL).    Allergies  Allergen Reactions   Bee Venom Anaphylaxis   Lisinopril  Swelling and Other (See Comments)    Lips became very swollen- "Allergic," per [facility]   Antifungal [Miconazole Nitrate] Other (See Comments)    Headaches- "Allergic," per [facility]   Zolpidem Tartrate Er Other (See Comments)    Leg cramps    Antimicrobials this admission: 5/19 metronidazole  x1 5/19 Vancomycin  x1, resumed 5/20 >>  5/20 Azithromycin  >> 5/20 5/20 ceftriaxone  >> 5/20 5/20 Zosyn  >>   Microbiology results: 5/19 Resp panel: neg flu, covid, rsv 5/19 BCx: ngtd 5/19 MRSA PCR: neg  5/20 Sputum: not acceptable for testing 5/20 Resp panel PCR: none detected 5/20 tracheal aspirate: GPC and GNR on gram stain, reincubated for better growth  Thank you for allowing pharmacy to be a part of this  patient's care.  Juleen Oakland, PharmD PGY1 Pharmacy Resident 09/28/2023 10:12 AM

## 2023-09-28 NOTE — Progress Notes (Addendum)
 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 Upon arrival, patient was tachycardic, tachypnea, and hypoxic on 6L of O2 (baseline). Patient was on route to [facility] for treatment with carbo/taxol but was unable due to respiratory distress with hypoxia. Patient had a fever of 102, purulent tracheal secretions, WBC of 12.8, lactic 1.2, and hgb of 7.0. He was admitted under TRH service for concerns of sepsis secondary to pneumonia. TRH consulted PCCM on 5/20 due to abnormal CT scan of neck results-revealed extensive debris encompassing the tracheostomy affected by tumor which could be infectious debris vs blood clot, vs necrotic tumor or creation of a tracheoesophageal fistula.   Upon assessment, patient unable to give history due to tracheostomy and limited communication. However, patient on 10L via trache collar with O2 Sats 98%, RR 20s, SBP 110s-130s, temp 99.5 F.  Purulent tracheal secretions coming out of trache, as well as around trache site. Patient having pain upon touching surrounding tissue.   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- last radiation 06-2018   Cataract    removed both eyes with l;ens implants    COPD (chronic obstructive  pulmonary disease) (HCC)    Dyspnea    Family history of colon cancer    in his brother- dx'd age 25    History of chemotherapy    last 06-01-2018   History of kidney stones    History of radiation therapy    last radiation 06-2018   Hyperlipidemia    currently under control   Hypertension    Irregular heart beats    Lymphadenopathy    Pain, lower leg    Bilateral   Peripheral arterial disease (HCC)    Pre-diabetes    Red-green color blindness    RLS (restless legs syndrome)    Snores    Wears glasses      Significant Hospital Events: Including procedures, antibiotic start and stop dates in addition to other pertinent events   5/20 PCCM consulted: CT scan of neck results-revealed extensive debris encompassing the tracheostomy affected by tumor which could be infectious debris vs blood clot, vs necrotic tumor or creation of a tracheoesophageal fistula. ENT consulted, Transfer to [place] for further airway evaluation  5/21 Seen by Dr Roslyn Coombe  from ENT who recommended no surgical intervention at this time and to continue antibiotics. He also recommended a palliative care consultation given the extensive paratracheal disease which might not be correctable.   Interim History / Subjective:   Remains at baseline. Continues to have chronic pain.   Objective    Blood pressure 118/71, pulse 99, temperature 98.2 F (36.8 C), temperature source Oral, resp. rate (!) 23, height 6' (1.829 m), weight 84.3 kg, SpO2 96%.  FiO2 (%):  [30 %-70 %] 50 %   Intake/Output Summary (Last 24 hours) at 09/28/2023 0936 Last data filed at 09/28/2023 0800 Gross per 24 hour  Intake 1589.29 ml  Output 1400 ml  Net 189.29 ml   Filed Weights   09/25/23 2222 09/28/23 0720  Weight: 99.8 kg 84.3 kg    Examination: General:Appears well nourished and in no distress.  HEENT: Tracheostomy in place with foul-smelling secretions from tracheostomy and surrounding site.  CV: HS normal pulm: Clear - on trach  collar.  Abs: bs active, soft, PEG tube in place. Extremities: no edema, no deformity, moves all extremities on command Skin: no rash  Neuro: Rass 0, follows commands, orientation difficult to assess, cough gag reflex present  GU: deferred    Ancillary Tests Personally Reviewed:   Leukocytosis 11.5, PLT 571 HB 7.5 Creatinine 0.85  Assessment and Plan  Acute on chronic respiratory failure with Hypoxia  Sepsis secondary to Multifocal PnA  Paratracheal mass s/p tracheostomy (08/03/23 at [facility]) in setting of Metastatic laryngeal squamous cell carcinoma hx  COPD  Anemia of Chronic Disease CHF Essential HTN  HLD GERD  Plan:   - Patient appears surprisingly well but doesn't have good treatment options. Infection may lead to further destruction and airway compromise and tumor will continue to progress without chemotherapy which he cannot receive due to infection.  - Continue current antibiotics.  - Progressive ambulation - Palliative care consultation - Have reached to ENT and Dr Salomon Cree to develop plan of care.  - At high risk of airway compromise. Keep in ICU until ENT re-evaluation in 48h.    Best Practice (right click and "Reselect all SmartList Selections" daily)   Diet/type: NPO - resume feeds via PEG. DVT prophylaxis LMWH Pressure ulcer(s): N/A GI prophylaxis: PPI Lines: N/A Foley:  N/A Code Status:  full code Last date of multidisciplinary goals of care discussion - discussed lack of options with patient 5/22  Arlina Lair, MD [facility] ICU Physician [facility]  Pager: [phone] Or Epic Secure Chat After hours: [phone].  09/28/2023, 9:36 AM

## 2023-09-28 NOTE — Progress Notes (Signed)
 Suture noted to the right of trach stoma, not attached to trach. CCM made aware. Advises addressing with ENT with their next visit to see patient.

## 2023-09-28 NOTE — Plan of Care (Signed)
 Patient  has had trach for about 2 months. He is very alert and is communicating by typing messages to staff on his phone without difficulty.

## 2023-09-29 DIAGNOSIS — Z515 Encounter for palliative care: Secondary | ICD-10-CM | POA: Diagnosis not present

## 2023-09-29 DIAGNOSIS — Z7189 Other specified counseling: Secondary | ICD-10-CM | POA: Diagnosis not present

## 2023-09-29 DIAGNOSIS — J9621 Acute and chronic respiratory failure with hypoxia: Secondary | ICD-10-CM | POA: Diagnosis not present

## 2023-09-29 DIAGNOSIS — Z930 Tracheostomy status: Secondary | ICD-10-CM | POA: Diagnosis not present

## 2023-09-29 DIAGNOSIS — G894 Chronic pain syndrome: Secondary | ICD-10-CM

## 2023-09-29 DIAGNOSIS — C329 Malignant neoplasm of larynx, unspecified: Secondary | ICD-10-CM

## 2023-09-29 DIAGNOSIS — C8338 Diffuse large B-cell lymphoma, lymph nodes of multiple sites: Secondary | ICD-10-CM

## 2023-09-29 DIAGNOSIS — A419 Sepsis, unspecified organism: Secondary | ICD-10-CM | POA: Diagnosis not present

## 2023-09-29 DIAGNOSIS — J9601 Acute respiratory failure with hypoxia: Secondary | ICD-10-CM

## 2023-09-29 DIAGNOSIS — J189 Pneumonia, unspecified organism: Secondary | ICD-10-CM | POA: Diagnosis not present

## 2023-09-29 LAB — BASIC METABOLIC PANEL WITH GFR
Anion gap: 7 (ref 5–15)
BUN: 19 mg/dL (ref 8–23)
CO2: 28 mmol/L (ref 22–32)
Calcium: 8.5 mg/dL — ABNORMAL LOW (ref 8.9–10.3)
Chloride: 99 mmol/L (ref 98–111)
Creatinine, Ser: 0.78 mg/dL (ref 0.61–1.24)
GFR, Estimated: >60 mL/min (ref >60)
Glucose, Bld: 225 mg/dL — ABNORMAL HIGH (ref 70–99)
Potassium: 4.4 mmol/L (ref 3.5–5.1)
Sodium: 134 mmol/L — ABNORMAL LOW (ref 135–145)

## 2023-09-29 LAB — GLUCOSE, CAPILLARY
Glucose-Capillary: 211 mg/dL — ABNORMAL HIGH (ref 70–99)
Glucose-Capillary: 188 mg/dL — ABNORMAL HIGH (ref 70–99)
Glucose-Capillary: 158 mg/dL — ABNORMAL HIGH (ref 70–99)
Glucose-Capillary: 136 mg/dL — ABNORMAL HIGH (ref 70–99)
Glucose-Capillary: 180 mg/dL — ABNORMAL HIGH (ref 70–99)

## 2023-09-29 LAB — CULTURE, RESPIRATORY W GRAM STAIN

## 2023-09-29 LAB — CBC
HCT: 24.2 % — ABNORMAL LOW (ref 39.0–52.0)
Hemoglobin: 7.4 g/dL — ABNORMAL LOW (ref 13.0–17.0)
MCH: 26.9 pg (ref 26.0–34.0)
MCHC: 30.6 g/dL (ref 30.0–36.0)
MCV: 88 fL (ref 80.0–100.0)
Platelets: 574 10*3/uL — ABNORMAL HIGH (ref 150–400)
RBC: 2.75 MIL/uL — ABNORMAL LOW (ref 4.22–5.81)
RDW: 17.6 % — ABNORMAL HIGH (ref 11.5–15.5)
WBC: 10.9 10*3/uL — ABNORMAL HIGH (ref 4.0–10.5)
nRBC: 0 % (ref 0.0–0.2)

## 2023-09-29 LAB — MAGNESIUM: Magnesium: 1.9 mg/dL (ref 1.7–2.4)

## 2023-09-29 MED ORDER — FENTANYL 75 MCG/HR TD PT72
1.0000 | MEDICATED_PATCH | TRANSDERMAL | Status: DC
  Administered 2023-09-29 – 2023-10-11 (×7): 1 via TRANSDERMAL
  Filled 2023-09-29 (×9): qty 1

## 2023-09-29 MED ORDER — AMOXICILLIN-POT CLAVULANATE 500-125 MG PO TABS: 1.0000 | ORAL_TABLET | Freq: Two times a day (BID) | ORAL | Status: DC

## 2023-09-29 MED ORDER — INSULIN ASPART 100 UNIT/ML IJ SOLN
2.0000 [IU] | INTRAMUSCULAR | Status: DC
  Administered 2023-09-29 – 2023-10-13 (×85): 2 [IU] via SUBCUTANEOUS

## 2023-09-29 MED ORDER — SODIUM CHLORIDE 0.9 % IV SOLN
3.0000 g | Freq: Four times a day (QID) | INTRAVENOUS | Status: AC
  Administered 2023-09-29 – 2023-10-02 (×14): 3 g via INTRAVENOUS
  Filled 2023-09-29 (×14): qty 8

## 2023-09-29 MED ORDER — AMOXICILLIN-POT CLAVULANATE 875-125 MG PO TABS
1.0000 | ORAL_TABLET | Freq: Two times a day (BID) | ORAL | Status: DC
  Administered 2023-10-03 – 2023-10-13 (×21): 1
  Filled 2023-09-29 (×21): qty 1

## 2023-09-29 NOTE — TOC Progression Note (Signed)
 Transition of Care [facility]) - Progression Note    Patient Details  Name: Jesus [name] MRN: [id number] Date of Birth: [birth year]  Transition of Care [facility]) CM/SW Contact  Juliane Och, LCSW Phone Number: 09/29/2023, 2:19 PM  Clinical Narrative:     2:19 PM Per progressions, Palliative Care NP is to conduct GOC meeting today with patient and patient's family.  Expected Discharge Plan: Skilled Nursing Facility Barriers to Discharge: Continued Medical Work up  Expected Discharge Plan and Services In-house Referral: Clinical Social Work   Post Acute Care Choice: Skilled Nursing Facility Living arrangements for the past 2 months: Single Family Home, Skilled Nursing Facility                                       Social Determinants of Health (SDOH) Interventions SDOH Screenings   Food Insecurity: Food Insecurity Present (09/25/2023)  Housing: High Risk (09/25/2023)  Transportation Needs: No Transportation Needs (09/25/2023)  Recent Concern: Transportation Needs - Unmet Transportation Needs (07/29/2023)  Utilities: Not At Risk (09/25/2023)  Depression (PHQ2-9): Low Risk  (11/09/2022)  Financial Resource Strain: Low Risk  (10/06/2020)  Social Connections: Moderately Isolated (09/25/2023)  Tobacco Use: Medium Risk (09/25/2023)    Readmission Risk Interventions    07/31/2023    4:28 PM 07/17/2023    3:08 PM 07/05/2023   11:49 AM  Readmission Risk Prevention Plan  Transportation Screening Complete Complete Complete  Medication Review Oceanographer) Complete Complete Complete  PCP or Specialist appointment within 3-5 days of discharge Complete Complete Complete  HRI or Home Care Consult Complete Complete Complete  SW Recovery Care/Counseling Consult Complete Complete Complete  Palliative Care Screening Not Applicable Not Applicable Not Applicable  Skilled Nursing Facility Not Applicable Not Applicable Not Applicable

## 2023-09-29 NOTE — Progress Notes (Signed)
 Physical Therapy Treatment Patient Details Name: Jesus [name] MRN: [id number] DOB: 01-25-1956 Today's Date: 09/29/2023   History of Present Illness Pt is 68 year old presented to [facility] from the cancer center on  09/25/23 for hypoxia and tachycardia. Pt with sepsis due to PNA. 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 Pt transferred to [facility] on 09/26/23. Pt from [facility] where he has been undergoing rehab after hospitalization and emergent trach at [facility] on 08/03/23. PMH - trach, laryngeal CA, Gtube, lymphoma, HTN, copd, chf    PT Comments  Pt is progressing towards goals. Pt assisted to toilet on request with occasional verbal cues for safe hand placement on RW and with grab bar from toilet. Pt is Min A for bed mobility and CGA for sit to stand/short distance gait with RW. Due to pt current functional status, home set up and available assistance at home recommending skilled physical therapy services < 3 hours/day in order to address strength, balance and functional mobility to decrease risk for falls, injury, immobility, skin break down and re-hospitalization.      If plan is discharge home, recommend the following: A lot of help with bathing/dressing/bathroom;A lot of help with walking and/or transfers;Assistance with cooking/housework;Assist for transportation;Help with stairs or ramp for entrance     Equipment Recommendations  None recommended by PT       Precautions / Restrictions Precautions Precautions: Fall;Other (comment) Recall of Precautions/Restrictions: Intact Precaution/Restrictions Comments: trach, gtube (trach is very sensitive becareful when moving it or placing him on O2) Restrictions Weight Bearing Restrictions Per Provider Order: No     Mobility  Bed Mobility Overal bed mobility: Needs Assistance Bed Mobility: Supine to Sit, Sit to Supine     Supine to sit: Contact guard Sit to supine: Contact guard assist        Transfers Overall transfer level: Needs  assistance Equipment used: Rolling walker (2 wheels) Transfers: Sit to/from Stand, Bed to chair/wheelchair/BSC Sit to Stand: Contact guard assist   Step pivot transfers: Contact guard assist       General transfer comment: CGA, assist with lines/leads    Ambulation/Gait Ambulation/Gait assistance: Contact guard assist Gait Distance (Feet): 10 Feet (2x) Assistive device: Rolling walker (2 wheels) Gait Pattern/deviations: Step-through pattern, Narrow base of support, Decreased stride length Gait velocity: decreased Gait velocity interpretation: <1.8 ft/sec, indicate of risk for recurrent falls   General Gait Details: Short steps with minimal heel toe gait pattern      Balance Overall balance assessment: Needs assistance Sitting-balance support: No upper extremity supported, Feet supported Sitting balance-Leahy Scale: Good     Standing balance support: Bilateral upper extremity supported, Reliant on assistive device for balance, During functional activity Standing balance-Leahy Scale: Fair Standing balance comment: no overt LOB        Communication Communication Communication: Impaired Factors Affecting Communication: Trach/intubated  Cognition Arousal: Alert Behavior During Therapy: WFL for tasks assessed/performed   PT - Cognitive impairments: No apparent impairments     PT - Cognition Comments: Pt typing on phone to communicate Following commands: Intact      Cueing Cueing Techniques: Verbal cues     General Comments General comments (skin integrity, edema, etc.): Pt HR/O2 sats remained WNL. Occasional O2 sats would drop with poor pleth line during WB through hands.      Pertinent Vitals/Pain Pain Assessment Pain Assessment: Faces Faces Pain Scale: Hurts whole lot Facial Expression: Tense Body Movements: Protection Muscle Tension: Tense, rigid Compliance with ventilator (intubated pts.): N/A Vocalization (extubated pts.): N/A  CPOT Total: 3 Pain  Location: throat, neck, upper back Pain Descriptors / Indicators: Grimacing, Guarding Pain Intervention(s): Monitored during session, Limited activity within patient's tolerance     PT Goals (current goals can now be found in the care plan section) Acute Rehab PT Goals Patient Stated Goal: not stated PT Goal Formulation: With patient Time For Goal Achievement: 10/11/23 Potential to Achieve Goals: Good Progress towards PT goals: Progressing toward goals    Frequency    Min 2X/week      PT Plan  Continue with current POC        AM-PAC PT "6 Clicks" Mobility   Outcome Measure  Help needed turning from your back to your side while in a flat bed without using bedrails?: A Little Help needed moving from lying on your back to sitting on the side of a flat bed without using bedrails?: A Little Help needed moving to and from a bed to a chair (including a wheelchair)?: A Little Help needed standing up from a chair using your arms (e.g., wheelchair or bedside chair)?: A Little Help needed to walk in hospital room?: A Little Help needed climbing 3-5 steps with a railing? : A Lot 6 Click Score: 17    End of Session Equipment Utilized During Treatment: Oxygen Activity Tolerance: Patient tolerated treatment well Patient left: in bed;with call bell/phone within reach;with bed alarm set;with nursing/sitter in room Nurse Communication: Mobility status PT Visit Diagnosis: Other abnormalities of gait and mobility (R26.89);Muscle weakness (generalized) (M62.81);Pain     Time: 5284-1324 PT Time Calculation (min) (ACUTE ONLY): 43 min  Charges:    $Therapeutic Activity: 38-52 mins PT General Charges $$ ACUTE PT VISIT: 1 Visit                    Sloan Duncans, DPT, CLT  Acute Rehabilitation Services Office: [phone] (Secure chat preferred)    Jenice Mitts 09/29/2023, 4:21 PM

## 2023-09-29 NOTE — Progress Notes (Signed)
 WAEL MAESTAS   DOB:March 25, 1956   MV#:[id number]      ASSESSMENT & PLAN:  Jesus [name] is a 68 year old male patient who came to the ED on 09/25/2023 due to respiratory distress.  Patient has medical history significant for history of DLBCL, and laryngeal cancer, status post tracheostomy.  He is currently on chemotherapy carboplatin  and Taxol.   Metastatic laryngeal squamous cell carcinoma, recurrent - Recurrence in facial groove with trachea and esophageal involvement - Previously seen by Drs. Wilson and [place] who did not feel local interventions were possible here.  Patient was sent to [facility] and was started on carboplatin  and Taxol. - He is status post radiation therapy 4 cycles although not seeing the full effects of this. - Findings likely represent necrotic debris and infection along with aspiration events.  There could also be some traction tracking into his mediastinum. - Not sure if this any other additional surgical interventions possible.  -Palliative systemic therapies (likely platinum based chemotherapy + cetuximab) were planned although are not possible in the context of infection.  Systemic therapy is on hold for now. -No surgical repair option per ENT/Dr. VandeGriend.   --Recommend Palliative for goals of care discussions --Appreciate Critical Care team management -Medical oncology/Dr. Salomon Cree following closely  Respiratory distress Dysphagia - Secondary to malignancy -- Admitted with purulent trach secretions -- G-tube inserted 06/21/23 -- s/p tracheostomy 08/03/23 [facility] Thoracic Sx.  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 Medical Oncology/Dr. Salomon Cree has followed  closely.    Anemia, normocytic -Hemoglobin low 7.4 - Likely multifactorial secondary to malignancy, poor nutrition  chronic disease - Recommend PRBC transfusion for hemoglobin <7.0 - Continue to monitor CBC with differential  Leukocytosis Sepsis Pneumonia -- WBC elevated, improving at 10.9 today -- continue antibiotics as ordered -- low grade temp, monitor fever curve  Thrombocytosis - Likely reactive - Elevated platelets 574K today - No intervention required.  Continue to monitor CBC with differential   Hypertension Hyperlipidemia CHF COPD -- continue to monitor blood pressure closely -- continue supportive care  Code Status Full  Subjective:  Patient seen awake and alert laying in bed. Trach intact. G-tube feeding ongoing.  Patient admits to throat/trach area ongoing pain. Reports breathing better now.  No other acute complaints.   Objective:   Intake/Output Summary (Last 24 hours) at 09/29/2023 1314 Last data filed at 09/29/2023 1100 Gross per 24 hour  Intake 2956.59 ml  Output 650 ml  Net 2306.59 ml     PHYSICAL EXAMINATION: ECOG PERFORMANCE STATUS: 4 - Bedbound  Vitals:   09/29/23 1100 09/29/23 1210  BP:  114/72  Pulse:  83  Resp:  15  Temp: 98.5 F (36.9 C)   SpO2: 93% 96%   Filed Weights   09/25/23 2222 09/28/23 0720  Weight: 220 lb (99.8 kg) 185 lb 13.6 oz (84.3 kg)    GENERAL: alert, +ill-appearing SKIN: +pale skin color, texture, turgor are normal, no rashes or significant lesions EYES: normal, conjunctiva are pink and non-injected, sclera clear OROPHARYNX: +trach intact NECK: supple, thyroid  normal size, non-tender, without nodularity LYMPH: no palpable lymphadenopathy in the cervical, axillary or inguinal LUNGS: clear to auscultation and percussion with normal breathing effort HEART: regular rate & rhythm and no murmurs and no lower extremity edema ABDOMEN: abdomen soft, non-tender and normal bowel sounds MUSCULOSKELETAL: no cyanosis of digits and no clubbing  PSYCH: alert & oriented x 3 with fluent speech NEURO: no focal motor/sensory deficits   All  questions were answered. The patient knows to call the clinic with any problems, questions or concerns.   The total time spent in the appointment was 30 minutes encounter with patient including review of chart and various tests results, discussions about plan of care and coordination of care plan  Jacqualin Mate, NP 09/29/2023 1:14 PM    Labs Reviewed:  Lab Results  Component Value Date   WBC 10.9 (H) 09/29/2023   HGB 7.4 (L) 09/29/2023   HCT 24.2 (L) 09/29/2023   MCV 88.0 09/29/2023   PLT 574 (H) 09/29/2023   Recent Labs    09/25/23 1352 09/26/23 0413 09/27/23 0500 09/27/23 1325 09/28/23 0328 09/29/23 0405  NA 133* 134*   < > 134* 133* 134*  K 4.9 4.4   < > 3.9 4.3 4.4  CL 89* 95*   < > 95* 95* 99  CO2 32 30   < > 27 28 28   GLUCOSE 186* 136*   < > 98 224* 225*  BUN 29* 23   < > 16 18 19   CREATININE 0.88 0.74   < > 0.85 0.92 0.78  CALCIUM  9.3 8.7*   < > 8.7* 8.7* 8.5*  GFRNONAA >60 >60   < > >60 >60 >60  PROT 8.2* 6.6  --  6.5  --   --   ALBUMIN 2.7* 2.1*  --  1.9*  --   --   AST 15 10*  --  11*  --   --   ALT 14 12  --  11  --   --   ALKPHOS 197* 142*  --  120  --   --   BILITOT 0.6 0.5  --  0.7  --   --    < > = values in this interval not displayed.    Studies Reviewed:  CT Soft Tissue Neck W Contrast Result Date: 09/26/2023 CLINICAL DATA:  Shortness of breath EXAM: CT NECK WITH CONTRAST TECHNIQUE: Multidetector CT imaging of the neck was performed using the standard protocol following the bolus administration of intravenous contrast. RADIATION DOSE REDUCTION: This exam was performed according to the departmental dose-optimization program which includes automated exposure control, adjustment of the mA and/or kV according to patient size and/or use of iterative reconstruction technique. CONTRAST:  75mL OMNIPAQUE  IOHEXOL  300 MG/ML  SOLN COMPARISON:  06/15/2023 FINDINGS: Pharynx and larynx: Irregular masslike thickening involving the upper cervical esophagus and  infraglottic airway with tracheostomy tube. Patchy gas bubbles encompass the tracheostomy tube in the airway at the thoracic inlet. This frothy material may reflect necrotic tumor, infection, or prior bleeding - no active hemorrhage is seen. Patient is certainly at risk for esophagotracheal fistula. The degree of tracheal narrowing is improved. Submucosal low-density thickening involving the supraglottic airway, symmetric and presumably related to radiotherapy. Salivary glands: No acute finding Thyroid : Patchy low-density in the right thyroid  since prior, direct invasion of the left para median thyroid  suspected on comparison study. The cystic spaces could reflect progressive tumor with necrosis or infectious collections, they diffusely involve the right lobe. Lymph nodes: Multiple heterogeneous enhancing cervical lymph nodes in the left jugular chain which are more prominent than before, up to 14 mm in the left lower neck on 6:91 Vascular: Extrinsic narrowing of the lower left IJ. Porta catheter on the right in unremarkable position. No pseudoaneurysm seen. Limited intracranial: No acute finding Visualized orbits: No  acute finding Mastoids and visualized paranasal sinuses: Clear Skeleton: Generalized cervical spine degeneration. Upper chest: Clear apical lungs IMPRESSION: Extensive debris like appearance encompassing the tracheostomy at the upper airway which is extensively affected by tumor. This could reflect infectious debris, blood clot, and or necrotic tumor. Patient is certainly at risk for tracheoesophageal fistula given the extent of tumor. Evidence of direct left thyroid  invasion by tumor on prior study. Progressive low-density areas in the right lobe thyroid  which could be infiltrating tumor or infectious collections. Submucosal thickening of the supraglottic airway, symmetric and presumably treatment related. Malignant adenopathy in the left jugular chain. Electronically Signed   By: Ronnette Coke M.D.    On: 09/26/2023 09:18   CT CHEST ABDOMEN PELVIS WO CONTRAST Result Date: 09/25/2023 CLINICAL DATA:  Sepsis cancer EXAM: CT CHEST, ABDOMEN AND PELVIS WITHOUT CONTRAST TECHNIQUE: Multidetector CT imaging of the chest, abdomen and pelvis was performed following the standard protocol without IV contrast. RADIATION DOSE REDUCTION: This exam was performed according to the departmental dose-optimization program which includes automated exposure control, adjustment of the mA and/or kV according to patient size and/or use of iterative reconstruction technique. COMPARISON:  CT 08/03/2023, 07/15/2023, PET CT 04/14/2023 FINDINGS: CT CHEST FINDINGS Cardiovascular: Limited evaluation without intravenous contrast. Right-sided central venous port tip at the cavoatrial region. No significant pericardial effusion. Nonaneurysmal aorta Mediastinum/Nodes: Interim tracheostomy tube. Abnormal gas and debris surrounding the tracheostomy tube, series 2, image 4 through 10. Tip of the tube terminates several cm above the carina. Tracheal deviation to the right from known mass lesion. Enlarged right paratracheal node measures 14 mm compared with 10 mm previously. Interval small volume fluid in the anterior mediastinum and generalized hazy appearance of the mediastinal fat compared to prior. Circumferential soft tissue mass/thickening at the included thoracic inlet surrounds the trachea. Mass appears slightly smaller compared with 07/15/2023 chest CT. Hazy density surrounding the distal trachea to the bifurcation and extends to the subcarinal region. Lungs/Pleura: Dense bilateral lower lobe consolidations with air bronchograms. Adjacent heterogeneous consolidations and ground-glass densities. No pleural effusion. Musculoskeletal: No acute osseous abnormality CT ABDOMEN PELVIS FINDINGS Hepatobiliary: No focal liver abnormality is seen. No gallstones, gallbladder wall thickening, or biliary dilatation. Pancreas: Unremarkable. No pancreatic  ductal dilatation or surrounding inflammatory changes. Spleen: Normal in size without focal abnormality. Adrenals/Urinary Tract: Adrenal glands are stable in appearance. Bilateral renal cysts for which no imaging follow-up is recommended. Punctate nonobstructing left kidney stone. No hydronephrosis. Nonspecific left greater than right perinephric stranding. The bladder is unremarkable Stomach/Bowel: The stomach contains a gastrostomy tube in the distal body. There is no dilated small bowel. No acute bowel wall thickening. Negative appendix. Vascular/Lymphatic: Aortic atherosclerosis. No enlarged abdominal or pelvic lymph nodes. Reproductive: Slightly enlarged prostate Other: Negative for pelvic effusion or free air. Small fat containing umbilical hernia. Musculoskeletal: Degenerative changes. No acute osseous abnormality. IMPRESSION: 1. Interim tracheostomy tube with abnormal appearance of gas and debris surrounding the tracheostomy tube. Findings could be secondary to focal perforation versus infection. Contrast-enhanced CT of the neck/upper chest could be obtained if patient able to tolerate. 2. Interval small volume fluid and stranding in the anterior mediastinum and generalized hazy appearance of the mediastinal fat compared to prior, which may be inflammatory or infectious in etiology. New dense bilateral lower lobe consolidations with air bronchograms, suspect for multifocal pneumonia and or aspiration. 3. Circumferential soft tissue mass/thickening at the included thoracic inlet surrounds the trachea. Left paratracheal mass appears slightly smaller compared with 07/15/2023 chest CT, but there is  slight interval enlargement of the right paratracheal node. Hazy density surrounding the distal trachea to the bifurcation and extends to the subcarinal region, difficult to assess without contrast, possible edema, infection, or additional soft tissue mass. 4. No CT evidence for acute intra-abdominal or pelvic  abnormality. Punctate nonobstructing left kidney stone. Nonspecific left greater than right perinephric stranding, correlate with urinalysis. 5. Aortic atherosclerosis. Aortic Atherosclerosis (ICD10-I70.0). Electronically Signed   By: Esmeralda Hedge M.D.   On: 09/25/2023 20:25   DG Chest Port 1 View Result Date: 09/25/2023 CLINICAL DATA:  Questionable sepsis. EXAM: PORTABLE CHEST 1 VIEW COMPARISON:  Chest radiograph dated 07/29/2023. FINDINGS: Right-sided Port-A-Cath with tip at the cavoatrial junction. Shallow inspiration with bibasilar atelectasis. No pleural effusion pneumothorax. The cardiac silhouette is within limits. Tracheostomy approximately 5 cm above the carina. No acute osseous pathology. IMPRESSION: Shallow inspiration with bibasilar atelectasis. Electronically Signed   By: Angus Bark M.D.   On: 09/25/2023 15:34

## 2023-09-29 NOTE — Progress Notes (Deleted)
 NAME:  Jesus [name], MRN:  [id number], DOB:  [birth year], LOS: 4 ADMISSION DATE:  09/25/2023, CONSULTATION DATE:  09/26/23  REFERRING MD:  MD Carloyn Chi CHIEF COMPLAINT: Respiratory Distress   History of Present Illness:  Patient is a 68 year old male with significant past medical history of diffuse large B-cell lymphoma, metastatic laryngeal squamous cell carcinoma, paratracheal mass s/p tracheostomy 08/03/23 per thoracic surgery at [facility] [place], essential HTN, diastolic CHF, HLD, COPD and anemia of chronic disease who presents [facility] to [facility] ED due to respiratory distress. Upon arrival, patient was tachycardic, tachypnea, and hypoxic on 6L of O2 (baseline). Patient was on route to [facility] for treatment with carbo/taxol but was unable due to respiratory distress with hypoxia. Patient had a fever of 102, purulent tracheal secretions, WBC of 12.8, lactic 1.2, and hgb of 7.0. He was admitted under TRH service for concerns of sepsis secondary to pneumonia. TRH consulted PCCM on 5/20 due to abnormal CT scan of neck results-revealed extensive debris encompassing the tracheostomy affected by tumor which could be infectious debris vs blood clot, vs necrotic tumor or creation of a tracheoesophageal fistula.   Upon assessment, patient unable to give history due to tracheostomy and limited communication. However, patient on 10L via trache collar with O2 Sats 98%, RR 20s, SBP 110s-130s, temp 99.5 F.  Purulent tracheal secretions coming out of trache, as well as around trache site. Patient having pain upon touching surrounding tissue.   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- last radiation 06-2018   Cataract    removed both eyes with l;ens implants    COPD (chronic obstructive  pulmonary disease) (HCC)    Dyspnea    Family history of colon cancer    in his brother- dx'd age 90    History of chemotherapy    last 06-01-2018   History of kidney stones    History of radiation therapy    last radiation 06-2018   Hyperlipidemia    currently under control   Hypertension    Irregular heart beats    Lymphadenopathy    Pain, lower leg    Bilateral   Peripheral arterial disease (HCC)    Pre-diabetes    Red-green color blindness    RLS (restless legs syndrome)    Snores    Wears glasses      Significant Hospital Events: Including procedures, antibiotic start and stop dates in addition to other pertinent events   5/20 PCCM consulted: CT scan of neck results-revealed extensive debris encompassing the tracheostomy affected by tumor which could be infectious debris vs blood clot, vs necrotic tumor or creation of a tracheoesophageal fistula. ENT consulted, Transfer to [facility] for further airway evaluation  5/21 Seen by Dr Roslyn Coombe  from ENT who recommended no surgical intervention at this time and to continue antibiotics. He also recommended a palliative care consultation given the extensive paratracheal disease which might not be correctable.   Interim History / Subjective:   Remains at baseline. Continues to have chronic pain.   Objective    Blood pressure 115/69, pulse 82, temperature 97.7 F (36.5 C), temperature source Oral, resp. rate 18, height 6' (1.829 m), weight 84.3 kg, SpO2 95%.  FiO2 (%):  [35 %-50 %] 35 %   Intake/Output Summary (Last 24 hours) at 09/29/2023 1054 Last data filed at 09/29/2023 1000 Gross per 24 hour  Intake 3147.97 ml  Output 1150 ml  Net 1997.97 ml   Filed Weights   09/25/23 2222 09/28/23 0720  Weight: 99.8 kg 84.3 kg    Examination: General:Appears well nourished and in no distress.  HEENT: Tracheostomy in place with foul-smelling secretions from tracheostomy and surrounding site.  CV: HS normal pulm: Clear - on trach  collar.  Abs: bs active, soft, PEG tube in place. Extremities: no edema, no deformity, moves all extremities on command Skin: no rash  Neuro: Rass 0, follows commands, orientation difficult to assess, cough gag reflex present  GU: deferred    Ancillary Tests Personally Reviewed:   Leukocytosis 11.5, PLT 571 HB 7.5 Creatinine 0.85  Assessment and Plan  Acute on chronic respiratory failure with Hypoxia  Sepsis secondary to Multifocal PnA  Paratracheal mass s/p tracheostomy (08/03/23 at [facility]) in setting of Metastatic laryngeal squamous cell carcinoma hx  COPD  Anemia of Chronic Disease CHF Essential HTN  HLD GERD  Plan:   - Patient appears surprisingly well but doesn't have good treatment options. Infection may lead to further destruction and airway compromise and tumor will continue to progress without chemotherapy which he cannot receive due to infection.  - Continue current antibiotics.  - Progressive ambulation - Palliative care consultation - Have discussed case at length with Dr Roslyn Coombe from ENT. There is no surgical repair option, nor is there a good option    Best Practice (right click and "Reselect all SmartList Selections" daily)   Diet/type: NPO - resume feeds via PEG. DVT prophylaxis LMWH Pressure ulcer(s): N/A GI prophylaxis: PPI Lines: N/A Foley:  N/A Code Status:  full code Last date of multidisciplinary goals of care discussion - discussed lack of options with patient 5/22  Arlina Lair, MD [facility] ICU Physician [facility] Critical [facility]  Pager: [phone] Or Epic Secure Chat After hours: [phone].  09/29/2023, 10:54 AM

## 2023-09-29 NOTE — Consult Note (Signed)
 Consultation Note Date: 09/29/2023   Patient Name: Jesus [name]  DOB: Aug 16, 1955  MRN: [id number]  Age / Sex: 68 y.o., male  PCP: [name], MD Referring Physician: Arlina Lair, MD  Reason for Consultation: Establishing goals of care  HPI/Patient Profile: 68 y.o. male  with past medical history of diffuse large B cell lymphoma, anemia of chronic disease, essential hypertension, metastatic laryngeal cancer, chronic pain syndrome, GERD, COPD, diastolic CHF, hyperlipidemia, status post emergent tracheostomy at [facility] 08/03/23, currently on chemotherapy, admitted on 09/25/2023 with dyspnea and significant hypoxemia while on the way from [facility] to cancer center visit for chemo (follows with Dr. Salomon Cree).   Patient was sent to ED as a result and admitted for sepsis due to bilateral PNA, acute on chronic hypoxic respiratory failure. Concern for infection around the trach site as well. During this admission, abnormal CT scan of neck results-revealed extensive debris encompassing the tracheostomy affected by tumor which could be infectious debris vs blood clot, vs necrotic tumor or creation of a tracheoesophageal fistula.   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 PMT has been consulted to assist with goals of care conversation.  Clinical Assessment and Goals of Care:  I have reviewed medical records including EPIC notes, labs and imaging, discussed with RN, assessed the patient and then met at the bedside to discuss diagnosis prognosis, GOC, EOL wishes, disposition and options.  I introduced Palliative Medicine as specialized medical care for people living with serious illness. It focuses on providing relief from the symptoms and stress of a serious illness. The goal is to improve quality of life for both the patient and the family.  We discussed a brief life review of the patient and then focused on their current illness.   I  attempted to elicit values and goals of care important to the patient.    Medical History Review and Understanding:  Patient expresses that the medical team has adequately explained his acute illness and treatment plan.   Social History: Patient states that he lives with his 3 children. He is divorced. His children have their own health problems as well and he types on his phone that they depend on him. He is a Curator.   Functional and Nutritional State: Patient ambulated with walker just prior to my arrival. He shares that he can feed himself. Albumin of 1.9 noted.  Palliative Symptoms: Pain at trach site, 4/10 at the lowest, 7/10 currently, 10/10 at its worst  Naproxen  and morphine  are most helpful.  He has tried Robinul  in the past for excessive secretions  Advance Directives: A detailed discussion regarding advanced directives was had. Patient is interested in completion HCPOA/Living Will documentation during his hospitalization.   Code Status: Concepts specific to code status, artifical feeding and hydration, and rehospitalization were considered and discussed.   Discussion: Patient shares that his goal is to return home and his main questions are how he would suction in this setting.  He also is wondering about managing his breathing and trach care at home.  We discussed the philosophy of hospice versus home health, quality versus quantity of life, and what each path would look like.  He understands that he is at a crossroad and that he will need to make a decision on the focus on his care and his priorities at home.  We reviewed symptom management strategies and options with comfort-focused care versus medical management and maintenance of vital signs, labs etc. his family is very important to  him, especially his granddaughter, he would like their support in making these decisions.  He gave this PA permission to call his daughter to coordinate a date/time for a family meeting. I  then spoke with his daughter [name] by phone and scheduled for tomorrow at [facility].   The difference between aggressive medical intervention and comfort care was considered in light of the patient's goals of care. Hospice and Palliative Care services outpatient were explained and offered.   Discussed the importance of continued conversation with family and the medical providers regarding overall plan of care and treatment options, ensuring decisions are within the context of the patient's values and GOCs.   Questions and concerns were addressed.  Hard Choices booklet left for review. The family was encouraged to call with questions or concerns.  PMT will continue to support holistically.   SUMMARY OF RECOMMENDATIONS   -Patient would like family meeting to include his 3 children in GOC discussion. Scheduled for 5/24 at 9AM -His goal is to return home and he is open to discussing hospice in more detail, quality vs quantity of life -Ongoing GOC discussions Spiritual care consult for assistance with advance directives, patient hopes to complete on Monday -Psychosocial and emotional support provided -PMT will continue to follow and support   Prognosis:  Poor long-term prognosis with lack of long-term treatment options   Discharge Planning: To Be Determined      Primary Diagnoses: Present on Admission:  Multifocal pneumonia  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HCC)  Sepsis due to pneumonia (HCC)  Anemia of chronic disease  Acute metabolic encephalopathy  HTN (hypertension)  Chronic pain  ED (erectile dysfunction) of organic origin  Gastroesophageal reflux disease without esophagitis  Acute hypoxic respiratory failure (HCC)    Physical Exam Vitals and nursing note reviewed.  Constitutional:      General: He is not in acute distress.    Appearance: He is ill-appearing.     Comments: Trach, 10L O2  Cardiovascular:     Rate and Rhythm: Normal rate.   Pulmonary:     Effort: Pulmonary effort is normal.  Neurological:     Mental Status: He is alert and oriented to person, place, and time. Mental status is at baseline.  Psychiatric:        Behavior: Behavior normal.     Vital Signs: BP 115/72   Pulse 77   Temp 97.7 F (36.5 C) (Oral)   Resp 17   Ht 6' (1.829 m)   Wt 84.3 kg   SpO2 99%   BMI 25.21 kg/m  Pain Scale: 0-10   Pain Score: 10-Worst pain ever   SpO2: SpO2: 99 % O2 Device:SpO2: 99 % O2 Flow Rate: .O2 Flow Rate (L/min): 10 L/min   Palliative Assessment/Data:    MDM: high   Onesti Bonfiglio Alroy Jericho, PA-C  Palliative Medicine Team Team phone # [phone]  Thank you for allowing the Palliative Medicine Team to assist in the care of this patient. Please utilize secure chat with additional questions, if there is no response within 30 minutes please call the above phone number.  Palliative Medicine Team providers are available by phone from 7am to 7pm daily and can be reached through the team cell phone.  Should this patient require assistance outside of these hours, please call the patient's attending physician.

## 2023-09-29 NOTE — Plan of Care (Signed)
  Problem: Education: Goal: Knowledge of General Education information will improve Description: Including pain rating scale, medication(s)/side effects and non-pharmacologic comfort measures Outcome: Progressing   Problem: Health Behavior/Discharge Planning: Goal: Ability to manage health-related needs will improve Outcome: Progressing   Problem: Clinical Measurements: Goal: Ability to maintain clinical measurements within normal limits will improve Outcome: Progressing Goal: Will remain free from infection Outcome: Progressing Goal: Diagnostic test results will improve Outcome: Progressing Goal: Respiratory complications will improve Outcome: Progressing Goal: Cardiovascular complication will be avoided Outcome: Progressing   Problem: Activity: Goal: Risk for activity intolerance will decrease Outcome: Progressing   Problem: Nutrition: Goal: Adequate nutrition will be maintained Outcome: Progressing   Problem: Coping: Goal: Level of anxiety will decrease Outcome: Progressing   Problem: Elimination: Goal: Will not experience complications related to bowel motility Outcome: Progressing Goal: Will not experience complications related to urinary retention Outcome: Progressing   Problem: Pain Managment: Goal: General experience of comfort will improve and/or be controlled Outcome: Progressing   Problem: Safety: Goal: Ability to remain free from injury will improve Outcome: Progressing   Problem: Skin Integrity: Goal: Risk for impaired skin integrity will decrease Outcome: Progressing   Problem: Education: Goal: Ability to describe self-care measures that may prevent or decrease complications (Diabetes Survival Skills Education) will improve Outcome: Progressing Goal: Individualized Educational Video(s) Outcome: Progressing   Problem: Coping: Goal: Ability to adjust to condition or change in health will improve Outcome: Progressing   Problem: Fluid  Volume: Goal: Ability to maintain a balanced intake and output will improve Outcome: Progressing   Problem: Health Behavior/Discharge Planning: Goal: Ability to identify and utilize available resources and services will improve Outcome: Progressing Goal: Ability to manage health-related needs will improve Outcome: Progressing   Problem: Metabolic: Goal: Ability to maintain appropriate glucose levels will improve Outcome: Progressing   Problem: Nutritional: Goal: Maintenance of adequate nutrition will improve Outcome: Progressing Goal: Progress toward achieving an optimal weight will improve Outcome: Progressing   Problem: Skin Integrity: Goal: Risk for impaired skin integrity will decrease Outcome: Progressing   Problem: Tissue Perfusion: Goal: Adequacy of tissue perfusion will improve Outcome: Progressing   Problem: Fluid Volume: Goal: Hemodynamic stability will improve Outcome: Progressing   Problem: Clinical Measurements: Goal: Diagnostic test results will improve Outcome: Progressing Goal: Signs and symptoms of infection will decrease Outcome: Progressing   Problem: Respiratory: Goal: Ability to maintain adequate ventilation will improve Outcome: Progressing   Problem: Education: Goal: Knowledge about tracheostomy care/management will improve Outcome: Progressing   Problem: Activity: Goal: Ability to tolerate increased activity will improve Outcome: Progressing   Problem: Health Behavior/Discharge Planning: Goal: Ability to manage tracheostomy will improve Outcome: Progressing   Problem: Respiratory: Goal: Patent airway maintenance will improve Outcome: Progressing   Problem: Role Relationship: Goal: Ability to communicate will improve Outcome: Progressing    Patient tube feeds at goal. Patient up out of bed ambulating in room to bathroom today.

## 2023-09-29 NOTE — Progress Notes (Signed)
 NAME:  Jesus [name], MRN:  [id number], DOB:  February 18, 1956, LOS: 4 ADMISSION DATE:  09/25/2023, CONSULTATION DATE:  09/26/23  REFERRING MD:  MD Carloyn Chi CHIEF COMPLAINT: Respiratory Distress   History of Present Illness:  Patient is a 68 year old male with significant past medical history of diffuse large B-cell lymphoma, metastatic laryngeal squamous cell carcinoma, paratracheal mass s/p tracheostomy 08/03/23 per thoracic surgery at [facility], essential HTN, diastolic CHF, HLD, COPD and anemia of chronic disease who [facility] ED due to respiratory distress. Upon arrival, patient was tachycardic, tachypnea, and hypoxic on 6L of O2 (baseline). Patient was on route to [facility] for treatment with carbo/taxol but was unable due to respiratory distress with hypoxia. Patient had a fever of 102, purulent tracheal secretions, WBC of 12.8, lactic 1.2, and hgb of 7.0. He was admitted under TRH service for concerns of sepsis secondary to pneumonia. TRH consulted PCCM on 5/20 due to abnormal CT scan of neck results-revealed extensive debris encompassing the tracheostomy affected by tumor which could be infectious debris vs blood clot, vs necrotic tumor or creation of a tracheoesophageal fistula.   Upon assessment, patient unable to give history due to tracheostomy and limited communication. However, patient on 10L via trache collar with O2 Sats 98%, RR 20s, SBP 110s-130s, temp 99.5 F.  Purulent tracheal secretions coming out of trache, as well as around trache site. Patient having pain upon touching surrounding tissue.   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- last radiation 06-2018   Cataract    removed both eyes with l;ens implants    COPD (chronic obstructive  pulmonary disease) (HCC)    Dyspnea    Family history of colon cancer    in his brother- dx'd age 59    History of chemotherapy    last 06-01-2018   History of kidney stones    History of radiation therapy    last radiation 06-2018   Hyperlipidemia    currently under control   Hypertension    Irregular heart beats    Lymphadenopathy    Pain, lower leg    Bilateral   Peripheral arterial disease (HCC)    Pre-diabetes    Red-green color blindness    RLS (restless legs syndrome)    Snores    Wears glasses      Significant Hospital Events: Including procedures, antibiotic start and stop dates in addition to other pertinent events   5/20 PCCM consulted: CT scan of neck results-revealed extensive debris encompassing the tracheostomy affected by tumor which could be infectious debris vs blood clot, vs necrotic tumor or creation of a tracheoesophageal fistula. ENT consulted, Transfer to [facility] for further airway evaluation  5/21 Seen by Dr Roslyn Coombe  from ENT who recommended no surgical intervention at this time and to continue antibiotics. He also recommended a palliative care consultation given the extensive paratracheal disease which might not be correctable.   Interim History / Subjective:   Remains at baseline. Continues to have chronic pain.   Objective    Blood pressure 114/72, pulse 83, temperature 98.5 F (36.9 C), resp. rate 15, height 6' (1.829 m), weight 84.3 kg, SpO2 96%.    FiO2 (%):  [  35 %-50 %] 35 %   Intake/Output Summary (Last 24 hours) at 09/29/2023 1228 Last data filed at 09/29/2023 1100 Gross per 24 hour  Intake 3049.13 ml  Output 1150 ml  Net 1899.13 ml   Filed Weights   09/25/23 2222 09/28/23 0720  Weight: 99.8 kg 84.3 kg    Examination: General:Appears well nourished and in no distress.  HEENT: Tracheostomy in place with foul-smelling secretions from tracheostomy and surrounding site.  CV: HS normal pulm: Clear - on trach collar.  Abs: bs active,  soft, PEG tube in place. Extremities: no edema, no deformity, moves all extremities on command Skin: no rash  Neuro: Rass 0, follows commands, orientation difficult to assess, cough gag reflex present  GU: deferred    Ancillary Tests Personally Reviewed:   Leukocytosis 11.5, PLT 571 HB 7.5 Creatinine 0.85  Assessment and Plan  Acute on chronic respiratory failure with Hypoxia  Sepsis secondary to Multifocal PnA  Paratracheal mass s/p tracheostomy (08/03/23 at [facility]) in setting of Metastatic laryngeal squamous cell carcinoma hx  COPD  Anemia of Chronic Disease CHF Essential HTN  HLD GERD  Plan:   - Patient appears surprisingly well but doesn't have good treatment options. Infection may lead to further destruction and airway compromise and tumor will continue to progress without chemotherapy which he cannot receive due to infection.  - Continue current antibiotics.  - Progressive ambulation - Palliative care consultation - Have discussed case at length with Dr Roslyn Coombe from ENT. There is no surgical repair option, nor is there a good option to replace current tracheostomy tube without risking airway loss.  - Cannot receive further chemotherapy given ongoing infection.  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 - I have explained this to the patient and have essentially there is nothing further to offer besides palliative care apart from antibiotics.  Patient acknowledged understanding and was not surprised by this information. - Palliative care will see the patient today.  Patient was at [facility] for rehabilitation and was close to discharge.  Best option would likely be to go home on home hospice.   Best Practice (right click and "Reselect all SmartList Selections" daily)   Diet/type: NPO - resume feeds via PEG. DVT prophylaxis LMWH Pressure ulcer(s): N/A GI prophylaxis: PPI Lines:  N/A Foley:  N/A Code Status:  full code Last date of multidisciplinary goals of care discussion - discussed lack of options with patient 5/23.  Arlina Lair, MD [facility] ICU Physician [facility] [place] [facility]  Pager: [phone] Or Epic Secure Chat After hours: [phone].  09/29/2023, 12:28 PM

## 2023-09-29 NOTE — Plan of Care (Signed)
  Problem: Education: Goal: Knowledge of General Education information will improve Description: Including pain rating scale, medication(s)/side effects and non-pharmacologic comfort measures Outcome: Progressing   Problem: Clinical Measurements: Goal: Ability to maintain clinical measurements within normal limits will improve Outcome: Progressing Goal: Will remain free from infection Outcome: Progressing Goal: Diagnostic test results will improve Outcome: Progressing   Problem: Nutrition: Goal: Adequate nutrition will be maintained Outcome: Progressing   Problem: Pain Managment: Goal: General experience of comfort will improve and/or be controlled Outcome: Progressing   Problem: Safety: Goal: Ability to remain free from injury will improve Outcome: Progressing   Problem: Coping: Goal: Ability to adjust to condition or change in health will improve Outcome: Progressing   Problem: Skin Integrity: Goal: Risk for impaired skin integrity will decrease Outcome: Not Progressing Refusing q2h turns despite repeated education.

## 2023-09-30 DIAGNOSIS — J189 Pneumonia, unspecified organism: Secondary | ICD-10-CM | POA: Diagnosis not present

## 2023-09-30 DIAGNOSIS — C8338 Diffuse large B-cell lymphoma, lymph nodes of multiple sites: Secondary | ICD-10-CM | POA: Diagnosis not present

## 2023-09-30 DIAGNOSIS — Z7189 Other specified counseling: Secondary | ICD-10-CM | POA: Diagnosis not present

## 2023-09-30 DIAGNOSIS — Z515 Encounter for palliative care: Secondary | ICD-10-CM | POA: Diagnosis not present

## 2023-09-30 DIAGNOSIS — Z930 Tracheostomy status: Secondary | ICD-10-CM

## 2023-09-30 LAB — CBC
HCT: 24.2 % — ABNORMAL LOW (ref 39.0–52.0)
Hemoglobin: 7.1 g/dL — ABNORMAL LOW (ref 13.0–17.0)
MCH: 26 pg (ref 26.0–34.0)
MCHC: 29.3 g/dL — ABNORMAL LOW (ref 30.0–36.0)
MCV: 88.6 fL (ref 80.0–100.0)
Platelets: 619 10*3/uL — ABNORMAL HIGH (ref 150–400)
RBC: 2.73 MIL/uL — ABNORMAL LOW (ref 4.22–5.81)
RDW: 17.6 % — ABNORMAL HIGH (ref 11.5–15.5)
WBC: 7.4 10*3/uL (ref 4.0–10.5)
nRBC: 0 % (ref 0.0–0.2)

## 2023-09-30 LAB — BASIC METABOLIC PANEL WITH GFR
Anion gap: 9 (ref 5–15)
BUN: 16 mg/dL (ref 8–23)
CO2: 27 mmol/L (ref 22–32)
Calcium: 8.3 mg/dL — ABNORMAL LOW (ref 8.9–10.3)
Chloride: 99 mmol/L (ref 98–111)
Creatinine, Ser: 0.71 mg/dL (ref 0.61–1.24)
GFR, Estimated: >60 mL/min (ref >60)
Glucose, Bld: 212 mg/dL — ABNORMAL HIGH (ref 70–99)
Potassium: 4.6 mmol/L (ref 3.5–5.1)
Sodium: 135 mmol/L (ref 135–145)

## 2023-09-30 LAB — CULTURE, BLOOD (ROUTINE X 2)
Culture: NO GROWTH
Culture: NO GROWTH
Special Requests: ADEQUATE

## 2023-09-30 LAB — GLUCOSE, CAPILLARY
Glucose-Capillary: 148 mg/dL — ABNORMAL HIGH (ref 70–99)
Glucose-Capillary: 151 mg/dL — ABNORMAL HIGH (ref 70–99)
Glucose-Capillary: 159 mg/dL — ABNORMAL HIGH (ref 70–99)
Glucose-Capillary: 168 mg/dL — ABNORMAL HIGH (ref 70–99)
Glucose-Capillary: 169 mg/dL — ABNORMAL HIGH (ref 70–99)
Glucose-Capillary: 178 mg/dL — ABNORMAL HIGH (ref 70–99)
Glucose-Capillary: 179 mg/dL — ABNORMAL HIGH (ref 70–99)
Glucose-Capillary: 191 mg/dL — ABNORMAL HIGH (ref 70–99)

## 2023-09-30 LAB — MAGNESIUM: Magnesium: 1.7 mg/dL (ref 1.7–2.4)

## 2023-09-30 MED ORDER — FLORANEX PO PACK
1.0000 g | PACK | Freq: Three times a day (TID) | ORAL | Status: DC
  Administered 2023-09-30 – 2023-10-13 (×38): 1 g
  Filled 2023-09-30 (×43): qty 1

## 2023-09-30 MED ORDER — BACID PO TABS
2.0000 | ORAL_TABLET | Freq: Three times a day (TID) | ORAL | Status: DC
  Filled 2023-09-30 (×2): qty 2

## 2023-09-30 MED ORDER — MAGNESIUM SULFATE 2 GM/50ML IV SOLN
2.0000 g | Freq: Once | INTRAVENOUS | Status: DC
  Administered 2023-09-30: 2 g via INTRAVENOUS
  Filled 2023-09-30: qty 50

## 2023-09-30 NOTE — Progress Notes (Signed)
 Report called to nurse accepting pt in room 4E20C.  Preparing pt to transfer to new unit via bed.

## 2023-09-30 NOTE — Progress Notes (Addendum)
 Daily Progress Note   Patient Name: Jesus [name]       Date: 09/30/2023 DOB: 03/05/1956  Age: 68 y.o. MRN#: [id number] Attending Physician: Arlina Lair, MD Primary Care Physician: [name], MD Admit Date: 09/25/2023  Reason for Consultation/Follow-up: Establishing goals of care  Subjective: Medical records reviewed including progress notes, labs and imaging. Patient assessed at the bedside. He reports feeling well. Discussed with RN.   Patient has not spoken with his daughter yet and I provided him with update on my call with her yesterday with plan to meet today at 9AM. He now prefers to defer a family meeting and visit with her independently today, then call PMT when ready to continue GOC discussions. He wants to enjoy his family on the holiday weekend.   Upon her arrival shortly after, I provided [name] with hard choices booklet and PMT contact information. Patient indicates that he may still want to meet with this PA today but they will call and let me know.  Questions and concerns addressed. PMT will continue to support holistically.   Length of Stay: 5   Physical Exam Vitals and nursing note reviewed.  Constitutional:      General: He is not in acute distress.    Appearance: He is ill-appearing.     Comments: Trach with purulent secretions  Cardiovascular:     Rate and Rhythm: Normal rate.  Pulmonary:     Effort: Pulmonary effort is normal.  Neurological:     Mental Status: He is alert and oriented to person, place, and time.  Psychiatric:        Behavior: Behavior normal.        Cognition and Memory: Cognition normal.             Vital Signs: BP (!) 152/88   Pulse 85   Temp 97.8 F (36.6 C) (Oral)   Resp 19   Ht 6' (1.829 m)   Wt 97.4 kg   SpO2 98%   BMI 29.12 kg/m  SpO2: SpO2: 98 % O2 Device:  O2 Device: Tracheostomy Collar O2 Flow Rate: O2 Flow Rate (L/min): 10 L/min      Palliative Assessment/Data:   Palliative Care Assessment & Plan   Patient Profile: 68 y.o. male  with past medical history of diffuse large B cell lymphoma, anemia of chronic disease, essential hypertension, metastatic laryngeal cancer, chronic pain syndrome, GERD, COPD, diastolic CHF, hyperlipidemia, status post emergent tracheostomy at [facility] 08/03/23, currently on chemotherapy, admitted on 09/25/2023 with dyspnea and significant hypoxemia while on the way from [facility] to cancer center visit for chemo (follows with  Dr. Salomon Cree).    Patient was sent to ED as a result and admitted for sepsis due to bilateral PNA, acute on chronic hypoxic respiratory failure. Concern for infection around the trach site as well. During this admission, abnormal CT scan of neck results-revealed extensive debris encompassing the tracheostomy affected by tumor which could be infectious debris vs blood clot, vs necrotic tumor or creation of a tracheoesophageal fistula.    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 PMT has been consulted to assist with goals of care conversation.  Assessment: Goals of care conversation Acute on chronic respiratory failure with Hypoxia  Sepsis secondary to Multifocal PnA  Paratracheal mass s/p tracheostomy Metastatic laryngeal squamous cell carcinoma   Recommendations/Plan: Patient defers family meeting today and would prefer to visit with his daughter independently, then call PMT when ready to proceed. He has requested potentially Monday with specific time TBD PMT will continue to follow and support   Prognosis:  Poor long-term prognosis with lack of long-term treatment options   Discharge Planning: To Be Determined  Care plan was discussed with patient, patient's daughter, RN   Total time: I spent 35 minutes in the care of the patient today in the above activities and  documenting the encounter.   Akyla Vavrek P Seferino Oscar, PA-C  Palliative Medicine Team Team phone # [phone]  Thank you for allowing the Palliative Medicine Team to assist in the care of this patient. Please utilize secure chat with additional questions, if there is no response within 30 minutes please call the above phone number.  Palliative Medicine Team providers are available by phone from 7am to 7pm daily and can be reached through the team cell phone.  Should this patient require assistance outside of these hours, please call the patient's attending physician.

## 2023-09-30 NOTE — Progress Notes (Signed)
 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 ED due to respiratory distress. Upon arrival, patient was tachycardic, tachypnea, and hypoxic on 6L of O2 (baseline). Patient was on route to [facility] for treatment with carbo/taxol but was unable due to respiratory distress with hypoxia. Patient had a fever of 102, purulent tracheal secretions, WBC of 12.8, lactic 1.2, and hgb of 7.0. He was admitted under TRH service for concerns of sepsis secondary to pneumonia. TRH consulted PCCM on 5/20 due to abnormal CT scan of neck results-revealed extensive debris encompassing the tracheostomy affected by tumor which could be infectious debris vs blood clot, vs necrotic tumor or creation of a tracheoesophageal fistula.   Upon assessment, patient unable to give history due to tracheostomy and limited communication. However, patient on 10L via trache collar with O2 Sats 98%, RR 20s, SBP 110s-130s, temp 99.5 F.  Purulent tracheal secretions coming out of trache, as well as around trache site. Patient having pain upon touching surrounding tissue.   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- last radiation 06-2018   Cataract    removed both eyes with l;ens implants    COPD (chronic obstructive  pulmonary disease) (HCC)    Dyspnea    Family history of colon cancer    in his brother- dx'd age 57    History of chemotherapy    last 06-01-2018   History of kidney stones    History of radiation therapy    last radiation 06-2018   Hyperlipidemia    currently under control   Hypertension    Irregular heart beats    Lymphadenopathy    Pain, lower leg    Bilateral   Peripheral arterial disease (HCC)    Pre-diabetes    Red-green color blindness    RLS (restless legs syndrome)    Snores    Wears glasses      Significant Hospital Events: Including procedures, antibiotic start and stop dates in addition to other pertinent events   5/20 PCCM consulted: CT scan of neck results-revealed extensive debris encompassing the tracheostomy affected by tumor which could be infectious debris vs blood clot, vs necrotic tumor or creation of a tracheoesophageal fistula. ENT consulted, Transfer to [facility] for further airway evaluation  5/21 Seen by Dr Roslyn Coombe  from ENT who recommended no surgical intervention at this time and to continue antibiotics. He also recommended a palliative care consultation given the extensive paratracheal disease which might not be correctable.   Interim History / Subjective:   Remains on trach collar, purulence is unchanged. Pain better controlled.   Objective    Blood pressure 139/89, pulse 90, temperature 98.4 F (36.9 C), temperature source Oral, resp. rate 17, height 6' (1.829 m), weight 97.4 kg, SpO2  94%.    FiO2 (%):  [35 %] 35 %   Intake/Output Summary (Last 24 hours) at 09/30/2023 1356 Last data filed at 09/30/2023 1200 Gross per 24 hour  Intake 2475 ml  Output 1575 ml  Net 900 ml   Filed Weights   09/25/23 2222 09/28/23 0720 09/30/23 0500  Weight: 99.8 kg 84.3 kg 97.4 kg    Examination: General:Appears well nourished and in no distress.  HEENT: Tracheostomy in place with foul-smelling secretions from tracheostomy and surrounding site.  CV: HS  normal pulm: Clear - on trach collar.  Abs: bs active, soft, PEG tube in place. 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 Infection may lead to further destruction and airway compromise and tumor will continue to progress without chemotherapy which he cannot receive due to infection.  - Complete 7 days of IV Unasyn , 14 days of Augmentin  followed by suppression dose Augmentin  thereafter.   - Progressive ambulation - Palliative care consultation. Conversation regarding GOC deferred until Monday when daughter is available.  - Have discussed case at length with Dr Roslyn Coombe from ENT. There is no surgical repair option, nor is there a good option to replace current tracheostomy tube without risking airway loss.  - Cannot receive further chemotherapy given ongoing infection.  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 - I have explained this to the patient and have essentially there is nothing further to offer besides palliative care apart from antibiotics.  Patient acknowledged understanding and was not surprised by this information. - Ready for transfer to PCU. Patient was at [facility] for rehabilitation and was close to discharge.  Best option would likely be to go home on home  hospice.   Best Practice (right click and "Reselect all SmartList Selections" daily)   Diet/type: NPO - resume feeds via PEG. DVT prophylaxis LMWH Pressure ulcer(s): N/A GI prophylaxis: PPI Lines: N/A Foley:  N/A Code Status:  full code Last date of multidisciplinary goals of care discussion - discussed lack of options with patient 5/23.  Arlina Lair, MD [facility] ICU Physician [facility] [place]  Pager: [phone] Or Epic Secure Chat After hours: [phone].  09/30/2023, 1:56 PM

## 2023-09-30 NOTE — Progress Notes (Signed)
 Pharmacy Electrolyte Replacement  Recent Labs:  Recent Labs    09/30/23 0505  K 4.6  MG 1.7  CREATININE 0.71    Low Critical Values (K </= 2.5, Phos </= 1, Mg </= 1) Present: None  MD Contacted: n/a  Plan: Magnesium  Sulfate 2 g IV x1   Patience Bonito, PharmD, BCPS, BCCCP Clinical Pharmacist

## 2023-10-01 DIAGNOSIS — J189 Pneumonia, unspecified organism: Secondary | ICD-10-CM | POA: Diagnosis not present

## 2023-10-01 DIAGNOSIS — C8338 Diffuse large B-cell lymphoma, lymph nodes of multiple sites: Secondary | ICD-10-CM | POA: Diagnosis not present

## 2023-10-01 DIAGNOSIS — J9601 Acute respiratory failure with hypoxia: Secondary | ICD-10-CM | POA: Diagnosis not present

## 2023-10-01 DIAGNOSIS — G9341 Metabolic encephalopathy: Secondary | ICD-10-CM

## 2023-10-01 LAB — BASIC METABOLIC PANEL WITH GFR
Anion gap: 7 (ref 5–15)
BUN: 16 mg/dL (ref 8–23)
CO2: 30 mmol/L (ref 22–32)
Calcium: 8.6 mg/dL — ABNORMAL LOW (ref 8.9–10.3)
Chloride: 98 mmol/L (ref 98–111)
Creatinine, Ser: 0.81 mg/dL (ref 0.61–1.24)
GFR, Estimated: >60 mL/min (ref >60)
Glucose, Bld: 185 mg/dL — ABNORMAL HIGH (ref 70–99)
Potassium: 4.6 mmol/L (ref 3.5–5.1)
Sodium: 135 mmol/L (ref 135–145)

## 2023-10-01 LAB — CBC
HCT: 24 % — ABNORMAL LOW (ref 39.0–52.0)
Hemoglobin: 7.5 g/dL — ABNORMAL LOW (ref 13.0–17.0)
MCH: 27.2 pg (ref 26.0–34.0)
MCHC: 31.3 g/dL (ref 30.0–36.0)
MCV: 87 fL (ref 80.0–100.0)
Platelets: 710 10*3/uL — ABNORMAL HIGH (ref 150–400)
RBC: 2.76 MIL/uL — ABNORMAL LOW (ref 4.22–5.81)
RDW: 17.6 % — ABNORMAL HIGH (ref 11.5–15.5)
WBC: 8.7 10*3/uL (ref 4.0–10.5)
nRBC: 0 % (ref 0.0–0.2)

## 2023-10-01 LAB — GLUCOSE, CAPILLARY
Glucose-Capillary: 221 mg/dL — ABNORMAL HIGH (ref 70–99)
Glucose-Capillary: 195 mg/dL — ABNORMAL HIGH (ref 70–99)
Glucose-Capillary: 180 mg/dL — ABNORMAL HIGH (ref 70–99)
Glucose-Capillary: 167 mg/dL — ABNORMAL HIGH (ref 70–99)
Glucose-Capillary: 193 mg/dL — ABNORMAL HIGH (ref 70–99)

## 2023-10-01 LAB — MAGNESIUM: Magnesium: 2.1 mg/dL (ref 1.7–2.4)

## 2023-10-01 NOTE — Plan of Care (Signed)
  Problem: Education: Goal: Knowledge of General Education information will improve Description: Including pain rating scale, medication(s)/side effects and non-pharmacologic comfort measures Outcome: Progressing   Problem: Nutrition: Goal: Adequate nutrition will be maintained Outcome: Progressing   Problem: Safety: Goal: Ability to remain free from injury will improve Outcome: Progressing   Problem: Health Behavior/Discharge Planning: Goal: Ability to manage health-related needs will improve Outcome: Not Progressing   Problem: Clinical Measurements: Goal: Will remain free from infection Outcome: Not Progressing Goal: Respiratory complications will improve Outcome: Not Progressing   Problem: Activity: Goal: Risk for activity intolerance will decrease Outcome: Not Progressing   Problem: Coping: Goal: Level of anxiety will decrease Outcome: Not Progressing   Problem: Skin Integrity: Goal: Risk for impaired skin integrity will decrease Outcome: Not Progressing   Problem: Coping: Goal: Ability to adjust to condition or change in health will improve Outcome: Not Progressing   Problem: Metabolic: Goal: Ability to maintain appropriate glucose levels will improve Outcome: Not Progressing   Problem: Nutritional: Goal: Progress toward achieving an optimal weight will improve Outcome: Not Progressing

## 2023-10-01 NOTE — Progress Notes (Signed)
 The date on the port a cath dressing is 09/22/23.  The dressing and needle need to be changed as soon as possible.  Will see if day shift IV team can change them.  Hilton Lucky, RN

## 2023-10-01 NOTE — Progress Notes (Signed)
 [facility] PROGRESS NOTE    Progress Note  Jesus [name]  ZOX:[id number] DOB: 05-16-1955 DOA: 09/25/2023 PCP: [name], MD    Brief Narrative:   Jesus [name] is an 68 y.o. male past medical history of diffuse large B-cell lymphoma, metastatic laryngeal squamous carcinoma peritracheal mass status post tracheostomy on 08/03/2023 by thoracic surgery at [facility], essential hypertension chronic diastolic dysfunction anemia of chronic disease transferred from [facility] due to respiratory distress upon arrival was found tachycardic tachypneic and requiring 6 L of oxygen had a temperature of 102, with purulent tracheal secretions white count of 12 admitted on the [facility]ad service for sepsis secondary to pneumonia transferred to PCCM on 09/26/2023 CT scan of the neck revealed extensive debris encompassing the tracheostomy affected by tumor and necrotic tumor with trach esophageal fistula.  Transfer back to [facility] 10/01/2023 on empiric antibiotics he is to complete a 7-day course of IV Unasyn  followed by 14 days of Augmentin  for suppressive therapy.  Assessment/Plan:   Sepsis secondary to Multifocal pneumonia/acute respiratory failure with hypoxia secondary to malignancy: He completed 7-day course of IV Unasyn , now will continue 14-day course of Augmentin  for suppressive therapy. The patient has done extremely well but he does not have good treatment options,.  PCCM and ENT infection may lead to destructive airway compromise and tumor will continue to progress without chemotherapy which she is not a candidate to receive due to his infection. 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HCC)/with paratracheal mass status post tracheostomy on 08/03/2023 at [facility] / metastatic glandular squamous cell carcinoma: Oncology was consulted he  was previously seen by Dr. Elvan Hamel and Dr. Teofilo Fellers who did not feel intervention were possible he was seen at [facility] and was started on carboplatin  and Taxol. He is not a candidate for chemotherapy in the setting of infection. Palliative care was consulted for conversations regarding being goals of care but will be deferred till Monday when the daughter is available. ENT was involved in the care and there is no good surgical repair option nor good options to replace the current tracheostomy tube without risking airway loss though he is not a candidate for surgical intervention. Physicians have explained to the patient the severity of the case and they have offered palliative antibiotic coverage.  The patient understands the situation.  Anemia of chronic disease: Hemoglobin today is 7.5. Likely multifactorial secondary to malignancy and poor nutrition. Will transfuse if hemoglobin less than 7.  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  Thrombocytosis: Likely reactive.  Essential hypertension/hyperlipidemia/chronic diastolic dysfunction: Continue supportive care.   G-tube inserted on 06/21/2023. Noted.  RN Pressure Injury Documentation: Pressure Injury 09/25/23 Coccyx Stage 1 -  Intact skin with non-blanchable redness of a localized area usually over a bony prominence. in gluteal cleft and bottom of sacrum/coccyx (Active)  09/25/23 2150  Location: Coccyx  Location Orientation:   Staging: Stage 1 -  Intact skin with non-blanchable redness of a localized area usually over a bony prominence.  Wound Description (Comments): in gluteal cleft and bottom of sacrum/coccyx  Present on Admission: Yes  Dressing Type Foam - Lift dressing to assess site every shift 09/30/23 2100     DVT prophylaxis: lovenox  Family Communication:none Status is: Inpatient Remains inpatient appropriate because: Sepsis due to pneumonia    Code Status:     Code Status Orders  (From admission, onward)  Start     Ordered   09/25/23 2326  Full code  Continuous       Question:  By:  Answer:  Consent: discussion documented in EHR   09/25/23 2327           Code Status History     Date Active Date Inactive Code Status Order ID Comments User Context   07/29/2023 0830 07/31/2023 2300 Full Code [id number]  Danice Dural, MD ED   07/15/2023 0249 07/17/2023 2249 Full Code [id number]  Howerter, Gattis Kass, DO ED   07/02/2023 2323 07/05/2023 1755 Full Code [id number]  Howerter, Gattis Kass, DO ED   06/16/2023 0122 06/24/2023 1813 Full Code [id number]  Corrinne Din, MD ED   06/16/2023 0119 06/16/2023 0122 Full Code [id number]  Corrinne Din, MD ED   06/16/2023 0119 06/16/2023 0119 Limited: Do not attempt resuscitation (DNR) -DNR-LIMITED -Do Not Intubate/DNI  [id number]  Corrinne Din, MD ED   08/10/2022 1620 08/11/2022 0514 Full Code [id number]  Marland Silvas, MD HOV   05/28/2018 1307 06/01/2018 1637 Full Code [id number]  Frankie Israel, MD Inpatient   05/10/2018 1426 05/14/2018 1559 Full Code [id number]  Frankie Israel, MD Inpatient   04/17/2018 0040 04/20/2018 1907 Full Code [id number]  Fidencio Hue, MD Inpatient   04/16/2018 1531 04/17/2018 0039 Full Code [id number]  Frankie Israel, MD Inpatient   03/27/2018 1252 03/31/2018 1820 Full Code [id number]  Frankie Israel, MD Inpatient   02/26/2018 1030 03/02/2018 1650 Full Code [id number]  Frankie Israel, MD Inpatient   02/05/2018 1100 02/09/2018 1607 Full Code [id number]  Frankie Israel, MD Inpatient   01/15/2018 1126 01/19/2018 2134 Full Code [id number]  Frankie Israel, MD Inpatient         IV Access:   Peripheral IV   Procedures and diagnostic studies:   No results found.   Medical Consultants:   None.   Subjective:    Jesus [name] he is concerned about how he can take care of himself.  Objective:    Vitals:   09/30/23 2157 09/30/23 2349 10/01/23 0317 10/01/23 0324  BP:  135/84  (!) 128/99  Pulse: 84 86 95 90  Resp:  13 20 20 20   Temp:  97.8 F (36.6 C)  97.9 F (36.6 C)  TempSrc:  Oral  Oral  SpO2: 99% 97% 96% 94%  Weight:    101.1 kg  Height:       SpO2: 94 % O2 Flow Rate (L/min): 10 L/min FiO2 (%): 35 %   Intake/Output Summary (Last 24 hours) at 10/01/2023 0640 Last data filed at 10/01/2023 0612 Gross per 24 hour  Intake 2795.74 ml  Output 1300 ml  Net 1495.74 ml   Filed Weights   09/28/23 0720 09/30/23 0500 10/01/23 0324  Weight: 84.3 kg 97.4 kg 101.1 kg    Exam: General exam: In no acute distress. Respiratory system: Good air movement and clear to auscultation. Cardiovascular system: S1 & S2 heard, RRR. No JVD. Gastrointestinal system: Abdomen is nondistended, soft and nontender.  Extremities: No pedal edema. Skin: No rashes, lesions or ulcers Psychiatry: Judgement and insight appear normal. Mood & affect appropriate.    Data Reviewed:    Labs: Basic Metabolic Panel: Recent Labs  Lab 09/27/23 0500 09/27/23 1325 09/28/23 0328 09/29/23 0405 09/30/23 0505 10/01/23 0311  NA 135 134* 133* 134* 135 135  K 4.3 3.9 4.3 4.4 4.6 4.6  CL 95* 95* 95* 99 99 98  CO2 27 27 28  28  27 30  GLUCOSE 110* 98 224* 225* 212* 185*  BUN 18 16 18 19 16 16   CREATININE 0.89 0.85 0.92 0.78 0.71 0.81  CALCIUM  8.9 8.7* 8.7* 8.5* 8.3* 8.6*  MG 1.8  --  2.1 1.9 1.7 2.1  PHOS 4.1  --   --   --   --   --    GFR Estimated Creatinine Clearance: 108.9 mL/min (by C-G formula based on SCr of 0.81 mg/dL). Liver Function Tests: Recent Labs  Lab 09/25/23 1352 09/26/23 0413 09/27/23 1325  AST 15 10* 11*  ALT 14 12 11   ALKPHOS 197* 142* 120  BILITOT 0.6 0.5 0.7  PROT 8.2* 6.6 6.5  ALBUMIN 2.7* 2.1* 1.9*   No results for input(s): "LIPASE", "AMYLASE" in the last 168 hours. No results for input(s): "AMMONIA" in the last 168 hours. Coagulation profile Recent Labs  Lab 09/25/23 1352 09/26/23 0413  INR 1.2 1.3*   COVID-19 Labs  No results for input(s): "DDIMER", "FERRITIN", "LDH", "CRP" in  the last 72 hours.  Lab Results  Component Value Date   SARSCOV2NAA NEGATIVE 09/25/2023   SARSCOV2NAA NEGATIVE 07/29/2023   SARSCOV2NAA NEGATIVE 07/15/2023   SARSCOV2NAA NEGATIVE 07/11/2023    CBC: Recent Labs  Lab 09/25/23 1352 09/26/23 0513 09/27/23 1325 09/28/23 0339 09/29/23 0405 09/30/23 0505 10/01/23 0311  WBC 19.7*   < > 13.2* 11.5* 10.9* 7.4 8.7  NEUTROABS 16.7*  --   --  9.0*  --   --   --   HGB 8.5*   < > 7.0* 7.5* 7.4* 7.1* 7.5*  HCT 28.3*   < > 22.9* 24.6* 24.2* 24.2* 24.0*  MCV 87.1   < > 86.4 86.9 88.0 88.6 87.0  PLT 458*   < > 451* 571* 574* 619* 710*   < > = values in this interval not displayed.   Cardiac Enzymes: No results for input(s): "CKTOTAL", "CKMB", "CKMBINDEX", "TROPONINI" in the last 168 hours. BNP (last 3 results) No results for input(s): "PROBNP" in the last 8760 hours. CBG: Recent Labs  Lab 09/30/23 1613 09/30/23 1941 09/30/23 2041 09/30/23 2353 10/01/23 0406  GLUCAP 169* 148* 151* 179* 221*   D-Dimer: No results for input(s): "DDIMER" in the last 72 hours. Hgb A1c: No results for input(s): "HGBA1C" in the last 72 hours. Lipid Profile: No results for input(s): "CHOL", "HDL", "LDLCALC", "TRIG", "CHOLHDL", "LDLDIRECT" in the last 72 hours. Thyroid  function studies: No results for input(s): "TSH", "T4TOTAL", "T3FREE", "THYROIDAB" in the last 72 hours.  Invalid input(s): "FREET3" Anemia work up: No results for input(s): "VITAMINB12", "FOLATE", "FERRITIN", "TIBC", "IRON", "RETICCTPCT" in the last 72 hours. Sepsis Labs: Recent Labs  Lab 09/25/23 1359 09/26/23 0513 09/28/23 0339 09/29/23 0405 09/30/23 0505 10/01/23 0311  WBC  --    < > 11.5* 10.9* 7.4 8.7  LATICACIDVEN 1.2  --   --   --   --   --    < > = values in this interval not displayed.   Microbiology Recent Results (from the past 240 hours)  Blood Culture (routine x 2)     Status: None   Collection Time: 09/25/23  1:52 PM   Specimen: BLOOD  Result Value Ref Range  Status   Specimen Description   Final    BLOOD RIGHT ANTECUBITAL Performed at [facility], [address], [place], [place] [address]    Special Requests   Final    BOTTLES DRAWN AEROBIC AND ANAEROBIC Blood Culture adequate volume Performed at [facility], [address] [place], [place] [address]    Culture   Final    NO GROWTH 5 DAYS Performed at [facility], [address] [place], [place] [address]    Report Status 09/30/2023 FINAL  Final  Resp panel by RT-PCR (RSV, Flu A&B, Covid) Anterior Nasal Swab     Status: None   Collection Time: 09/25/23  3:02 PM   Specimen: Anterior Nasal Swab  Result Value Ref Range Status   SARS Coronavirus 2 by RT PCR NEGATIVE NEGATIVE Final    Comment: (NOTE) SARS-CoV-2 target nucleic acids are NOT DETECTED.  The SARS-CoV-2 RNA is generally detectable in upper respiratory specimens during the acute phase of infection. The lowest concentration of SARS-CoV-2 viral copies this assay can detect is 138 copies/mL. A negative result does not preclude SARS-Cov-2 infection and should not be used as the sole basis for treatment or other patient management decisions. A negative result may occur with  improper specimen collection/handling, submission of specimen other than nasopharyngeal swab, presence of viral mutation(s) within the areas targeted by this assay, and inadequate number of viral copies(<138 copies/mL). A negative result must be combined with clinical observations, patient history, and epidemiological information. The expected result is Negative.  Fact Sheet for Patients:  BloggerCourse.com  Fact Sheet for Healthcare Providers:  SeriousBroker.it  This test is no t yet approved or cleared by the [place]  FDA and  has been authorized for detection and/or diagnosis of SARS-CoV-2 by FDA under an Emergency Use Authorization (EUA). This EUA  will remain  in effect (meaning this test can be used) for the duration of the COVID-19 declaration under Section 564(b)(1) of the Act, 21 U.S.C.section 360bbb-3(b)(1), unless the authorization is terminated  or revoked sooner.       Influenza A by PCR NEGATIVE NEGATIVE Final   Influenza B by PCR NEGATIVE NEGATIVE Final    Comment: (NOTE) The Xpert Xpress SARS-CoV-2/FLU/RSV plus assay is intended as an aid in the diagnosis of influenza from Nasopharyngeal swab specimens and should not be used as a sole basis for treatment. Nasal washings and aspirates are unacceptable for Xpert Xpress SARS-CoV-2/FLU/RSV testing.  Fact Sheet for Patients: BloggerCourse.com  Fact Sheet for Healthcare Providers: SeriousBroker.it  This test is not yet approved or cleared by the [place]  FDA and has been authorized for detection and/or diagnosis of SARS-CoV-2 by FDA under an Emergency Use Authorization (EUA). This EUA will remain in effect (meaning this test can be used) for the duration of the COVID-19 declaration under Section 564(b)(1) of the Act, 21 U.S.C. section 360bbb-3(b)(1), unless the authorization is terminated or revoked.     Resp Syncytial Virus by PCR NEGATIVE NEGATIVE Final    Comment: (NOTE) Fact Sheet for Patients: BloggerCourse.com  Fact Sheet for Healthcare Providers: SeriousBroker.it  This test is not yet approved or cleared by the [place]  FDA and has been authorized for detection and/or diagnosis of SARS-CoV-2 by FDA under an Emergency Use Authorization (EUA). This EUA will remain in effect (meaning this test can be used) for the duration of the COVID-19 declaration under Section 564(b)(1) of the Act, 21 U.S.C. section 360bbb-3(b)(1), unless the authorization is terminated or revoked.  Performed at [facility], [address] [place], [place] [address]   Blood Culture (routine x 2)     Status: None   Collection Time: 09/25/23  3:33 PM   Specimen: BLOOD LEFT HAND  Result Value Ref Range Status  Specimen Description   Final    BLOOD LEFT HAND Performed at [facility], [address] [place], [place] [address]    Special Requests   Final    BOTTLES DRAWN AEROBIC AND ANAEROBIC Blood Culture results may not be optimal due to an inadequate volume of blood received in culture bottles Performed at [facility], [address] [place], [place] [address]    Culture   Final    NO GROWTH 5 DAYS Performed at [facility], [address], [place], [place] [address]    Report Status 09/30/2023 FINAL  Final  Expectorated Sputum Assessment w Gram Stain, Rflx to Resp Cult     Status: None   Collection Time: 09/26/23  9:03 AM   Specimen: Nasopharyngeal Swab; Sputum  Result Value Ref Range Status   Specimen Description EXPECTORATED SPUTUM  Final   Special Requests NONE  Final   Sputum evaluation   Final    Sputum specimen not acceptable for testing.  Please recollect.   NOTIFIED WHITESELL, C  Performed at [facility], [address], [place], [place] [address]    Report Status 09/26/2023 FINAL  Final  Respiratory (~20 pathogens) panel by PCR     Status: None   Collection Time: 09/26/23  9:36 AM   Specimen: Nasopharyngeal Swab; Respiratory  Result Value Ref Range Status   Adenovirus NOT DETECTED NOT DETECTED Final   Coronavirus 229E NOT DETECTED NOT DETECTED Final    Comment: (NOTE) The Coronavirus on the Respiratory Panel, DOES NOT test for the novel  Coronavirus (2019 nCoV)    Coronavirus HKU1 NOT DETECTED NOT DETECTED Final   Coronavirus NL63 NOT DETECTED NOT DETECTED Final   Coronavirus OC43 NOT DETECTED NOT DETECTED Final   Metapneumovirus NOT DETECTED NOT DETECTED Final   Rhinovirus / Enterovirus NOT DETECTED NOT DETECTED Final   Influenza A NOT  DETECTED NOT DETECTED Final   Influenza B NOT DETECTED NOT DETECTED Final   Parainfluenza Virus 1 NOT DETECTED NOT DETECTED Final   Parainfluenza Virus 2 NOT DETECTED NOT DETECTED Final   Parainfluenza Virus 3 NOT DETECTED NOT DETECTED Final   Parainfluenza Virus 4 NOT DETECTED NOT DETECTED Final   Respiratory Syncytial Virus NOT DETECTED NOT DETECTED Final   Bordetella pertussis NOT DETECTED NOT DETECTED Final   Bordetella Parapertussis NOT DETECTED NOT DETECTED Final   Chlamydophila pneumoniae NOT DETECTED NOT DETECTED Final   Mycoplasma pneumoniae NOT DETECTED NOT DETECTED Final    Comment: Performed at [facility], [address], [place], [place] [address]  Culture, Respiratory w Gram Stain     Status: None   Collection Time: 09/26/23  5:27 PM   Specimen: Tracheal Aspirate; Respiratory  Result Value Ref Range Status   Specimen Description TRACHEAL ASPIRATE  Final   Special Requests NONE  Final   Gram Stain   Final    FEW WBC PRESENT, PREDOMINANTLY PMN FEW GRAM POSITIVE COCCI FEW GRAM NEGATIVE RODS    Culture   Final    RARE KLEBSIELLA PNEUMONIAE ABUNDANT DIPHTHEROIDS(CORYNEBACTERIUM SPECIES) Standardized susceptibility testing for this organism is not available. Performed at [facility], [address], [place], [place] [address]    Report Status 09/29/2023 FINAL  Final   Organism ID, Bacteria KLEBSIELLA PNEUMONIAE  Final      Susceptibility   Klebsiella pneumoniae - MIC*    AMPICILLIN  RESISTANT Resistant     CEFEPIME  <=0.12 SENSITIVE Sensitive  CEFTAZIDIME <=1 SENSITIVE Sensitive     CEFTRIAXONE  <=0.25 SENSITIVE Sensitive     CIPROFLOXACIN <=0.25 SENSITIVE Sensitive     GENTAMICIN <=1 SENSITIVE Sensitive     IMIPENEM <=0.25 SENSITIVE Sensitive     TRIMETH /SULFA  <=20 SENSITIVE Sensitive     AMPICILLIN /SULBACTAM 4 SENSITIVE Sensitive     PIP/TAZO <=4 SENSITIVE Sensitive ug/mL    * RARE KLEBSIELLA PNEUMONIAE  MRSA Next Gen by PCR, Nasal     Status: None    Collection Time: 09/26/23  5:48 PM  Result Value Ref Range Status   MRSA by PCR Next Gen NOT DETECTED NOT DETECTED Final    Comment: (NOTE) The GeneXpert MRSA Assay (FDA approved for NASAL specimens only), is one component of a comprehensive MRSA colonization surveillance program. It is not intended to diagnose MRSA infection nor to guide or monitor treatment for MRSA infections. Test performance is not FDA approved in patients less than 47 years old. Performed at [facility], [address], [place], [place] [address]   MRSA Next Gen by PCR, Nasal     Status: None   Collection Time: 09/26/23  6:31 PM   Specimen: Nasal Mucosa; Nasal Swab  Result Value Ref Range Status   MRSA by PCR Next Gen NOT DETECTED NOT DETECTED Final    Comment: (NOTE) The GeneXpert MRSA Assay (FDA approved for NASAL specimens only), is one component of a comprehensive MRSA colonization surveillance program. It is not intended to diagnose MRSA infection nor to guide or monitor treatment for MRSA infections. Test performance is not FDA approved in patients less than 67 years old. Performed at [facility], [address] [place], [place] [address]      Medications:    acetaminophen  (TYLENOL ) oral liquid 160 mg/5 mL  650 mg Per Tube Q6H   [START ON 10/03/2023] amoxicillin -clavulanate  1 tablet Per Tube Q12H   Followed by   Cecily Cohen ON 10/17/2023] amoxicillin -clavulanate  1 tablet Oral BID   Chlorhexidine  Gluconate Cloth  6 each Topical Daily   enoxaparin  (LOVENOX ) injection  40 mg Subcutaneous Q24H   feeding supplement (PROSource TF20)  60 mL Per Tube Daily   fentaNYL   1 patch Transdermal Q48H   gabapentin   300 mg Per Tube Q12H   insulin  aspart  0-9 Units Subcutaneous Q4H   insulin  aspart  2 Units Subcutaneous Q4H   insulin  glargine-yfgn  5 Units Subcutaneous Daily   lactobacillus  1 g Per Tube TID WC   naproxen   500 mg Per Tube BID WC   mouth rinse  15 mL Mouth Rinse 4 times  per day   pantoprazole  (PROTONIX ) IV  40 mg Intravenous Daily   polyethylene glycol  17 g Per Tube Daily   senna  1 tablet Per Tube Daily   sodium chloride  flush  10-40 mL Intracatheter Q12H   Continuous Infusions:  ampicillin -sulbactam (UNASYN ) IV Stopped (10/01/23 1324)   feeding supplement (OSMOLITE 1.5 CAL) 80 mL/hr at 10/01/23 0612      LOS: 6 days   Jesus [name]  [facility]  10/01/2023, 6:40 AM

## 2023-10-01 NOTE — Plan of Care (Signed)
  Problem: Education: Goal: Knowledge of General Education information will improve Description: Including pain rating scale, medication(s)/side effects and non-pharmacologic comfort measures Outcome: Progressing   Problem: Health Behavior/Discharge Planning: Goal: Ability to manage health-related needs will improve Outcome: Progressing   Problem: Clinical Measurements: Goal: Ability to maintain clinical measurements within normal limits will improve Outcome: Progressing Goal: Will remain free from infection Outcome: Progressing Goal: Diagnostic test results will improve Outcome: Progressing Goal: Respiratory complications will improve Outcome: Progressing Goal: Cardiovascular complication will be avoided Outcome: Progressing   Problem: Activity: Goal: Risk for activity intolerance will decrease Outcome: Progressing   Problem: Nutrition: Goal: Adequate nutrition will be maintained Outcome: Progressing   Problem: Coping: Goal: Level of anxiety will decrease Outcome: Progressing   Problem: Elimination: Goal: Will not experience complications related to bowel motility Outcome: Progressing Goal: Will not experience complications related to urinary retention Outcome: Progressing   Problem: Pain Managment: Goal: General experience of comfort will improve and/or be controlled Outcome: Progressing   Problem: Safety: Goal: Ability to remain free from injury will improve Outcome: Progressing   Problem: Skin Integrity: Goal: Risk for impaired skin integrity will decrease Outcome: Progressing   Problem: Education: Goal: Ability to describe self-care measures that may prevent or decrease complications (Diabetes Survival Skills Education) will improve Outcome: Progressing   Problem: Coping: Goal: Ability to adjust to condition or change in health will improve Outcome: Progressing   Problem: Fluid Volume: Goal: Ability to maintain a balanced intake and output will  improve Outcome: Progressing   Problem: Health Behavior/Discharge Planning: Goal: Ability to identify and utilize available resources and services will improve Outcome: Progressing Goal: Ability to manage health-related needs will improve Outcome: Progressing   Problem: Metabolic: Goal: Ability to maintain appropriate glucose levels will improve Outcome: Progressing   Problem: Nutritional: Goal: Maintenance of adequate nutrition will improve Outcome: Progressing Goal: Progress toward achieving an optimal weight will improve Outcome: Progressing   Problem: Skin Integrity: Goal: Risk for impaired skin integrity will decrease Outcome: Progressing   Problem: Tissue Perfusion: Goal: Adequacy of tissue perfusion will improve Outcome: Progressing   Problem: Fluid Volume: Goal: Hemodynamic stability will improve Outcome: Progressing   Problem: Clinical Measurements: Goal: Diagnostic test results will improve Outcome: Progressing Goal: Signs and symptoms of infection will decrease Outcome: Progressing   Problem: Respiratory: Goal: Ability to maintain adequate ventilation will improve Outcome: Progressing   Problem: Education: Goal: Knowledge about tracheostomy care/management will improve Outcome: Progressing   Problem: Activity: Goal: Ability to tolerate increased activity will improve Outcome: Progressing   Problem: Health Behavior/Discharge Planning: Goal: Ability to manage tracheostomy will improve Outcome: Progressing   Problem: Respiratory: Goal: Patent airway maintenance will improve Outcome: Progressing   Problem: Role Relationship: Goal: Ability to communicate will improve Outcome: Progressing

## 2023-10-02 DIAGNOSIS — Z515 Encounter for palliative care: Secondary | ICD-10-CM | POA: Diagnosis not present

## 2023-10-02 DIAGNOSIS — J189 Pneumonia, unspecified organism: Secondary | ICD-10-CM | POA: Diagnosis not present

## 2023-10-02 DIAGNOSIS — A419 Sepsis, unspecified organism: Secondary | ICD-10-CM | POA: Diagnosis not present

## 2023-10-02 DIAGNOSIS — E43 Unspecified severe protein-calorie malnutrition: Secondary | ICD-10-CM

## 2023-10-02 DIAGNOSIS — Z7189 Other specified counseling: Secondary | ICD-10-CM | POA: Diagnosis not present

## 2023-10-02 LAB — BASIC METABOLIC PANEL WITH GFR
Anion gap: 8 (ref 5–15)
BUN: 14 mg/dL (ref 8–23)
CO2: 31 mmol/L (ref 22–32)
Calcium: 8.5 mg/dL — ABNORMAL LOW (ref 8.9–10.3)
Chloride: 98 mmol/L (ref 98–111)
Creatinine, Ser: 0.88 mg/dL (ref 0.61–1.24)
GFR, Estimated: >60 mL/min (ref >60)
Glucose, Bld: 162 mg/dL — ABNORMAL HIGH (ref 70–99)
Potassium: 4.8 mmol/L (ref 3.5–5.1)
Sodium: 137 mmol/L (ref 135–145)

## 2023-10-02 LAB — GLUCOSE, CAPILLARY
Glucose-Capillary: 146 mg/dL — ABNORMAL HIGH (ref 70–99)
Glucose-Capillary: 165 mg/dL — ABNORMAL HIGH (ref 70–99)
Glucose-Capillary: 170 mg/dL — ABNORMAL HIGH (ref 70–99)
Glucose-Capillary: 174 mg/dL — ABNORMAL HIGH (ref 70–99)
Glucose-Capillary: 178 mg/dL — ABNORMAL HIGH (ref 70–99)
Glucose-Capillary: 184 mg/dL — ABNORMAL HIGH (ref 70–99)

## 2023-10-02 LAB — CBC
HCT: 24 % — ABNORMAL LOW (ref 39.0–52.0)
Hemoglobin: 7.2 g/dL — ABNORMAL LOW (ref 13.0–17.0)
MCH: 26 pg (ref 26.0–34.0)
MCHC: 30 g/dL (ref 30.0–36.0)
MCV: 86.6 fL (ref 80.0–100.0)
Platelets: 689 10*3/uL — ABNORMAL HIGH (ref 150–400)
RBC: 2.77 MIL/uL — ABNORMAL LOW (ref 4.22–5.81)
RDW: 17.7 % — ABNORMAL HIGH (ref 11.5–15.5)
WBC: 7.9 10*3/uL (ref 4.0–10.5)
nRBC: 0 % (ref 0.0–0.2)

## 2023-10-02 LAB — MAGNESIUM: Magnesium: 1.9 mg/dL (ref 1.7–2.4)

## 2023-10-02 MED ORDER — SODIUM CHLORIDE 0.9 % IV SOLN: INTRAVENOUS | Status: AC | PRN

## 2023-10-02 MED ORDER — CARBAMIDE PEROXIDE 6.5 % OT SOLN
5.0000 [drp] | Freq: Two times a day (BID) | OTIC | Status: AC
  Administered 2023-10-02 – 2023-10-04 (×6): 5 [drp] via OTIC
  Filled 2023-10-02: qty 15

## 2023-10-02 NOTE — TOC Progression Note (Signed)
 Transition of Care [facility]) - Progression Note    Patient Details  Name: Jesus [name] MRN: [id number] Date of Birth: 07-17-1955  Transition of Care [facility]) CM/SW Contact  Arron Big, [place] Phone Number: 10/02/2023, 2:37 PM  Clinical Narrative:   CSW spoke with patient and family to discuss other placement options upon request. CSW informed family that at this time the only facilities that accept trach/PEG is [facility] and facilities in [place] . Family stated [place]  [place] far. Family inquired about [facility] and [facility]. CSW informed family that [facility] cannot care for patients complex needs, but will follow up with NCM about [facility].   TOC will continue to follow.    Expected Discharge Plan: Skilled Nursing Facility Barriers to Discharge: Continued Medical Work up  Expected Discharge Plan and Services In-house Referral: Clinical Social Work   Post Acute Care Choice: Skilled Nursing Facility Living arrangements for the past 2 months: Single Family Home, Skilled Nursing Facility                                       Social Determinants of Health (SDOH) Interventions SDOH Screenings   Food Insecurity: Food Insecurity Present (09/25/2023)  Housing: High Risk (09/25/2023)  Transportation Needs: No Transportation Needs (09/25/2023)  Recent Concern: Transportation Needs - Unmet Transportation Needs (07/29/2023)  Utilities: Not At Risk (09/25/2023)  Depression (PHQ2-9): Low Risk  (11/09/2022)  Financial Resource Strain: Low Risk  (10/06/2020)  Social Connections: Moderately Isolated (09/25/2023)  Tobacco Use: Medium Risk (09/25/2023)    Readmission Risk Interventions    07/31/2023    4:28 PM 07/17/2023    3:08 PM 07/05/2023   11:49 AM  Readmission Risk Prevention Plan  Transportation Screening Complete Complete Complete  Medication Review Oceanographer) Complete Complete Complete  PCP or Specialist appointment within 3-5 days of discharge Complete Complete Complete  HRI  or Home Care Consult Complete Complete Complete  SW Recovery Care/Counseling Consult Complete Complete Complete  Palliative Care Screening Not Applicable Not Applicable Not Applicable  Skilled Nursing Facility Not Applicable Not Applicable Not Applicable

## 2023-10-02 NOTE — TOC CM/SW Note (Signed)
 Noted pt/family interested in [facility], CM spoke with pt at bedside to confirm. Due to Holiday today, CM will reach out to [facility] liaison in the am to see if pt would be eligible for [facility].

## 2023-10-02 NOTE — Progress Notes (Signed)
 Daily Progress Note   Patient Name: Jesus [name]       Date: 10/02/2023 DOB: [birth year]  Age: 68 y.o. MRN#: [id number] Attending Physician: Macdonald Savoy, MD Primary Care Physician: [name], MD Admit Date: 09/25/2023  Reason for Consultation/Follow-up: Establishing goals of care  Subjective: Medical records reviewed including progress notes, labs and imaging. Patient assessed at the bedside. He is in good spirits today, denies any needs. His ex-wife and 2 daughters are present for scheduled family meeting to discuss diagnosis prognosis, GOC, EOL wishes, disposition and options.  I introduced Palliative Medicine as specialized medical care for people living with serious illness. It focuses on providing relief from the symptoms and stress of a serious illness. The goal is to improve quality of life for both the patient and the family.  Provided update on input from oncology, ENT and PCCM, as well as detailed review of patient's options - palliative antibiotic coverage for undetermined length of time and potential for complications from infection, pausing chemo v hospice philosophy - and explained resources in different settings including SHF and HH. The do not want him to go back to [facility] but DO want rehabilitation while continuing antibiotics. They are concerned about how they would be able to manage his care at home if they go that route and would desire some education with suctioning before discharge. Still open to another facility and inquiring about the hospital's rehab.  The difference between aggressive medical intervention and comfort care was considered in light of the patient's goals of care. Hospice and Palliative Care services outpatient were explained and offered. They are agreeable to outpatient  palliative.  Discussed the importance of continued conversation with family and the medical providers regarding overall plan of care and treatment options, ensuring decisions are within the context of the patient's values and GOCs. Patient desires to remain FULL CODE at this time.  Questions and concerns were addressed.  Hard Choices booklet left for review. The family was encouraged to call with questions or concerns.  PMT will continue to support holistically.   Length of Stay: 7   Physical Exam Vitals and nursing note reviewed.  Constitutional:      General: He is not in acute distress.    Appearance: He is ill-appearing.     Comments: Trach with purulent secretions  Cardiovascular:     Rate and Rhythm: Normal rate.  Pulmonary:     Effort: Pulmonary effort is normal.  Neurological:     Mental Status: He is alert and oriented to person, place, and time.  Psychiatric:  Behavior: Behavior normal.        Cognition and Memory: Cognition normal.             Vital Signs: BP 135/85 (BP Location: Left Arm)   Pulse 95   Temp 97.9 F (36.6 C) (Oral)   Resp 16   Ht 6' (1.829 m)   Wt 103.5 kg   SpO2 98%   BMI 30.95 kg/m  SpO2: SpO2: 98 % O2 Device: O2 Device: Tracheostomy Collar O2 Flow Rate: O2 Flow Rate (L/min): 10 L/min      Palliative Assessment/Data:   Palliative Care Assessment & Plan   Patient Profile: 68 y.o. male  with past medical history of diffuse large B cell lymphoma, anemia of chronic disease, essential hypertension, metastatic laryngeal cancer, chronic pain syndrome, GERD, COPD, diastolic CHF, hyperlipidemia, status post emergent tracheostomy at [facility] 08/03/23, currently on chemotherapy, admitted on 09/25/2023 with dyspnea and significant hypoxemia while on the way from [facility] to cancer center visit for chemo (follows with Dr. Salomon Cree).    Patient was sent to ED as a result and admitted for sepsis due to bilateral PNA, acute on chronic hypoxic respiratory  failure. Concern for infection around the trach site as well. During this admission, abnormal CT scan of neck results-revealed extensive debris encompassing the tracheostomy affected by tumor which could be infectious debris vs blood clot, vs necrotic tumor or creation of a tracheoesophageal fistula.    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 PMT has been consulted to assist with goals of care conversation.  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 Patient and family are hopeful he may be a candidate for chemotherapy again in the future Appreciate TOC assistance with outpatient palliative care referral and possibly looking into other SNF facilities that can support trach patient. Patient does not want to go back to [facility]  Psychosocial and emotional support provided PMT remains available as needed   Prognosis:  Poor long-term prognosis with lack of long-term treatment options   Discharge Planning: To Be Determined  Care plan was discussed with patient, patient's 2 daughters, ex-wife, MD, TOC   MDM: High    [name], PA-C  Palliative Medicine Team Team phone # [phone]  Thank you for allowing the Palliative Medicine Team to assist in the care of this patient. Please utilize secure chat with additional questions, if there is no response within 30 minutes please call the above phone number.  Palliative Medicine Team providers are available by phone from 7am to 7pm daily and can be reached through the team cell phone.  Should this patient require assistance outside of these hours, please call the patient's attending physician.

## 2023-10-02 NOTE — Progress Notes (Signed)
 [facility] PROGRESS NOTE    Progress Note  Jesus [name]  WUJ:[id number] DOB: 12-06-1955 DOA: 09/25/2023 PCP: [name], MD    Brief Narrative:   Jesus [name] is an 68 y.o. male past medical history of diffuse large B-cell lymphoma, metastatic laryngeal squamous carcinoma peritracheal mass status post tracheostomy on 08/03/2023 by thoracic surgery at [facility], essential hypertension chronic diastolic dysfunction anemia of chronic disease transferred from [facility] due to respiratory distress upon arrival was found tachycardic tachypneic and requiring 6 L of oxygen had a temperature of 102, with purulent tracheal secretions white count of 12 admitted on the [facility] service for sepsis secondary to pneumonia transferred to PCCM on 09/26/2023 CT scan of the neck revealed extensive debris encompassing the tracheostomy affected by tumor and necrotic tumor with trach esophageal fistula.  Transfer back to [facility] 10/01/2023 on empiric antibiotics he is to complete a 7-day course of IV Unasyn  followed by 14 days of Augmentin  for suppressive therapy.  Assessment/Plan:   Sepsis secondary to Multifocal pneumonia/acute respiratory failure with hypoxia secondary to malignancy: He completed 7-day course of IV Unasyn , now will continue 14-day course of Augmentin  for suppressive therapy. PCCM and ENT, relate infection may lead to destructive airway compromise and tumor will continue to progress without chemotherapy which she is not a candidate to receive due to his infection. 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 Follow-up with oncology as an outpatient. Oncology evaluated the patient on this admission and recommended moving towards hospice care. Plan of care to meet with patient and daughter today 10/02/2023.  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HCC)/with paratracheal  mass status post tracheostomy on 08/03/2023 at [facility] / metastatic glandular squamous cell carcinoma: Oncology was consulted he was previously seen by Dr. Elvan Hamel and Dr. Teofilo Fellers who did not feel intervention were possible he was seen at [facility] and was started on carboplatin  and Taxol. He is not a candidate for chemotherapy in the setting of infection. Palliative care was consulted for conversations regarding being goals of care but will be deferred till Monday when the daughter is available. ENT was involved in the care and there is no good surgical repair option nor good options to replace the current tracheostomy tube without risking airway loss though he is not a candidate for surgical intervention. Physicians have explained to the patient the severity of the case and they have offered palliative antibiotic coverage.  The patient understands the situation.  Anemia of chronic disease: Hemoglobin today is 7.2. Likely multifactorial secondary to malignancy and poor nutrition. Will transfuse if hemoglobin less than 7 or symptomatic.  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  Thrombocytosis: Likely reactive.  They are improving slowly.  Essential hypertension/hyperlipidemia/chronic diastolic dysfunction: Continue supportive care.  G-tube inserted on 06/21/2023. Noted.  RN Pressure Injury Documentation: Pressure Injury 09/25/23 Coccyx Stage 1 -  Intact skin with non-blanchable redness of a localized area usually over a bony prominence. in gluteal cleft and bottom of sacrum/coccyx (Active)  09/25/23 2150  Location: Coccyx  Location Orientation:   Staging: Stage 1 -  Intact skin with non-blanchable redness of a localized area usually over a bony prominence.  Wound Description (Comments): in gluteal cleft and bottom of sacrum/coccyx  Present on Admission: Yes  Dressing Type Foam - Lift dressing to assess site every shift 10/01/23 2000     DVT prophylaxis: lovenox  Family Communication:none Status  is: Inpatient Remains  inpatient appropriate because: Sepsis due to pneumonia    Code Status:     Code Status Orders  (From admission, onward)           Start     Ordered   09/25/23 2326  Full code  Continuous       Question:  By:  Answer:  Consent: discussion documented in EHR   09/25/23 2327           Code Status History     Date Active Date Inactive Code Status Order ID Comments User Context   07/29/2023 0830 07/31/2023 2300 Full Code [id number]  Danice Dural, MD ED   07/15/2023 0249 07/17/2023 2249 Full Code [id number]  Howerter, Gattis Kass, DO ED   07/02/2023 2323 07/05/2023 1755 Full Code [id number]  Howerter, Gattis Kass, DO ED   06/16/2023 0122 06/24/2023 1813 Full Code [id number]  Corrinne Din, MD ED   06/16/2023 0119 06/16/2023 0122 Full Code [id number]  Corrinne Din, MD ED   06/16/2023 0119 06/16/2023 0119 Limited: Do not attempt resuscitation (DNR) -DNR-LIMITED -Do Not Intubate/DNI  [id number]  Corrinne Din, MD ED   08/10/2022 1620 08/11/2022 0514 Full Code [id number]  Marland Silvas, MD HOV   05/28/2018 1307 06/01/2018 1637 Full Code [id number]  Frankie Israel, MD Inpatient   05/10/2018 1426 05/14/2018 1559 Full Code [id number]  Frankie Israel, MD Inpatient   04/17/2018 0040 04/20/2018 1907 Full Code [id number]  Fidencio Hue, MD Inpatient   04/16/2018 1531 04/17/2018 0039 Full Code [id number]  Frankie Israel, MD Inpatient   03/27/2018 1252 03/31/2018 1820 Full Code [id number]  Frankie Israel, MD Inpatient   02/26/2018 1030 03/02/2018 1650 Full Code [id number]  Frankie Israel, MD Inpatient   02/05/2018 1100 02/09/2018 1607 Full Code [id number]  Frankie Israel, MD Inpatient   01/15/2018 1126 01/19/2018 2134 Full Code [id number]  Frankie Israel, MD Inpatient         IV Access:   Peripheral IV   Procedures and diagnostic studies:   No results found.   Medical Consultants:   None.   Subjective:    Jesus [name] No complains  today  Objective:    Vitals:   10/02/23 0426 10/02/23 0526 10/02/23 0545 10/02/23 0800  BP:    135/85  Pulse: 85 93 87 89  Resp: 15 20 18 20   Temp:    97.9 F (36.6 C)  TempSrc:    Oral  SpO2: 95% 98% 97% 98%  Weight:   103.5 kg   Height:       SpO2: 98 % O2 Flow Rate (L/min): 10 L/min FiO2 (%): 35 %   Intake/Output Summary (Last 24 hours) at 10/02/2023 9485 Last data filed at 10/02/2023 0600 Gross per 24 hour  Intake 2477.77 ml  Output 1600 ml  Net 877.77 ml   Filed Weights   09/30/23 0500 10/01/23 0324 10/02/23 0545  Weight: 97.4 kg 101.1 kg 103.5 kg    Exam: General exam: In no acute distress. Respiratory system: Good air movement and clear to auscultation.  Trach in place Cardiovascular system: S1 & S2 heard, RRR. No JVD. Gastrointestinal system: Abdomen is nondistended, soft and nontender.  Extremities: No pedal edema. Skin: No rashes, lesions or ulcers Psychiatry: Judgement and insight appear normal. Mood & affect appropriate.  Data Reviewed:    Labs: Basic Metabolic Panel: Recent Labs  Lab 09/27/23 0500 09/27/23 1325 09/28/23 0328 09/29/23 0405 09/30/23 0505 10/01/23 0311 10/02/23 0350  NA 135   < >  133* 134* 135 135 137  K 4.3   < > 4.3 4.4 4.6 4.6 4.8  CL 95*   < > 95* 99 99 98 98  CO2 27   < > 28 28 27 30 31   GLUCOSE 110*   < > 224* 225* 212* 185* 162*  BUN 18   < > 18 19 16 16 14   CREATININE 0.89   < > 0.92 0.78 0.71 0.81 0.88  CALCIUM  8.9   < > 8.7* 8.5* 8.3* 8.6* 8.5*  MG 1.8  --  2.1 1.9 1.7 2.1 1.9  PHOS 4.1  --   --   --   --   --   --    < > = values in this interval not displayed.   GFR Estimated Creatinine Clearance: 101.4 mL/min (by C-G formula based on SCr of 0.88 mg/dL). Liver Function Tests: Recent Labs  Lab 09/25/23 1352 09/26/23 0413 09/27/23 1325  AST 15 10* 11*  ALT 14 12 11   ALKPHOS 197* 142* 120  BILITOT 0.6 0.5 0.7  PROT 8.2* 6.6 6.5  ALBUMIN 2.7* 2.1* 1.9*   No results for input(s): "LIPASE", "AMYLASE" in  the last 168 hours. No results for input(s): "AMMONIA" in the last 168 hours. Coagulation profile Recent Labs  Lab 09/25/23 1352 09/26/23 0413  INR 1.2 1.3*   COVID-19 Labs  No results for input(s): "DDIMER", "FERRITIN", "LDH", "CRP" in the last 72 hours.  Lab Results  Component Value Date   SARSCOV2NAA NEGATIVE 09/25/2023   SARSCOV2NAA NEGATIVE 07/29/2023   SARSCOV2NAA NEGATIVE 07/15/2023   SARSCOV2NAA NEGATIVE 07/11/2023    CBC: Recent Labs  Lab 09/25/23 1352 09/26/23 0513 09/28/23 0339 09/29/23 0405 09/30/23 0505 10/01/23 0311 10/02/23 0350  WBC 19.7*   < > 11.5* 10.9* 7.4 8.7 7.9  NEUTROABS 16.7*  --  9.0*  --   --   --   --   HGB 8.5*   < > 7.5* 7.4* 7.1* 7.5* 7.2*  HCT 28.3*   < > 24.6* 24.2* 24.2* 24.0* 24.0*  MCV 87.1   < > 86.9 88.0 88.6 87.0 86.6  PLT 458*   < > 571* 574* 619* 710* 689*   < > = values in this interval not displayed.   Cardiac Enzymes: No results for input(s): "CKTOTAL", "CKMB", "CKMBINDEX", "TROPONINI" in the last 168 hours. BNP (last 3 results) No results for input(s): "PROBNP" in the last 8760 hours. CBG: Recent Labs  Lab 10/01/23 1210 10/01/23 1544 10/01/23 2038 10/01/23 2359 10/02/23 0345  GLUCAP 180* 167* 193* 165* 178*   D-Dimer: No results for input(s): "DDIMER" in the last 72 hours. Hgb A1c: No results for input(s): "HGBA1C" in the last 72 hours. Lipid Profile: No results for input(s): "CHOL", "HDL", "LDLCALC", "TRIG", "CHOLHDL", "LDLDIRECT" in the last 72 hours. Thyroid  function studies: No results for input(s): "TSH", "T4TOTAL", "T3FREE", "THYROIDAB" in the last 72 hours.  Invalid input(s): "FREET3" Anemia work up: No results for input(s): "VITAMINB12", "FOLATE", "FERRITIN", "TIBC", "IRON", "RETICCTPCT" in the last 72 hours. Sepsis Labs: Recent Labs  Lab 09/25/23 1359 09/26/23 0513 09/29/23 0405 09/30/23 0505 10/01/23 0311 10/02/23 0350  WBC  --    < > 10.9* 7.4 8.7 7.9  LATICACIDVEN 1.2  --   --   --   --    --    < > = values in this interval not displayed.   Microbiology Recent Results (from the past 240 hours)  Blood Culture (routine x 2)  Status: None   Collection Time: 09/25/23  1:52 PM   Specimen: BLOOD  Result Value Ref Range Status   Specimen Description   Final    BLOOD RIGHT ANTECUBITAL Performed at [facility], [address] [place], [place] [address]    Special Requests   Final    BOTTLES DRAWN AEROBIC AND ANAEROBIC Blood Culture adequate volume Performed at [facility], [address], [place], [place] [address]    Culture   Final    NO GROWTH 5 DAYS Performed at [facility], [address] [place], [place] [address]    Report Status 09/30/2023 FINAL  Final  Resp panel by RT-PCR (RSV, Flu A&B, Covid) Anterior Nasal Swab     Status: None   Collection Time: 09/25/23  3:02 PM   Specimen: Anterior Nasal Swab  Result Value Ref Range Status   SARS Coronavirus 2 by RT PCR NEGATIVE NEGATIVE Final    Comment: (NOTE) SARS-CoV-2 target nucleic acids are NOT DETECTED.  The SARS-CoV-2 RNA is generally detectable in upper respiratory specimens during the acute phase of infection. The lowest concentration of SARS-CoV-2 viral copies this assay can detect is 138 copies/mL. A negative result does not preclude SARS-Cov-2 infection and should not be used as the sole basis for treatment or other patient management decisions. A negative result may occur with  improper specimen collection/handling, submission of specimen other than nasopharyngeal swab, presence of viral mutation(s) within the areas targeted by this assay, and inadequate number of viral copies(<138 copies/mL). A negative result must be combined with clinical observations, patient history, and epidemiological information. The expected result is Negative.  Fact Sheet for Patients:  BloggerCourse.com  Fact Sheet for Healthcare Providers:   SeriousBroker.it  This test is no t yet approved or cleared by the [place]  FDA and  has been authorized for detection and/or diagnosis of SARS-CoV-2 by FDA under an Emergency Use Authorization (EUA). This EUA will remain  in effect (meaning this test can be used) for the duration of the COVID-19 declaration under Section 564(b)(1) of the Act, 21 U.S.C.section 360bbb-3(b)(1), unless the authorization is terminated  or revoked sooner.       Influenza A by PCR NEGATIVE NEGATIVE Final   Influenza B by PCR NEGATIVE NEGATIVE Final    Comment: (NOTE) The Xpert Xpress SARS-CoV-2/FLU/RSV plus assay is intended as an aid in the diagnosis of influenza from Nasopharyngeal swab specimens and should not be used as a sole basis for treatment. Nasal washings and aspirates are unacceptable for Xpert Xpress SARS-CoV-2/FLU/RSV testing.  Fact Sheet for Patients: BloggerCourse.com  Fact Sheet for Healthcare Providers: SeriousBroker.it  This test is not yet approved or cleared by the [place]  FDA and has been authorized for detection and/or diagnosis of SARS-CoV-2 by FDA under an Emergency Use Authorization (EUA). This EUA will remain in effect (meaning this test can be used) for the duration of the COVID-19 declaration under Section 564(b)(1) of the Act, 21 U.S.C. section 360bbb-3(b)(1), unless the authorization is terminated or revoked.     Resp Syncytial Virus by PCR NEGATIVE NEGATIVE Final    Comment: (NOTE) Fact Sheet for Patients: BloggerCourse.com  Fact Sheet for Healthcare Providers: SeriousBroker.it  This test is not yet approved or cleared by the [place]  FDA and has been authorized for detection and/or diagnosis of SARS-CoV-2 by FDA under an Emergency Use Authorization (EUA). This EUA will remain in effect (meaning this test can be used) for  the duration  of the COVID-19 declaration under Section 564(b)(1) of the Act, 21 U.S.C. section 360bbb-3(b)(1), unless the authorization is terminated or revoked.  Performed at [facility], [address], [place], [place] [address]   Blood Culture (routine x 2)     Status: None   Collection Time: 09/25/23  3:33 PM   Specimen: BLOOD LEFT HAND  Result Value Ref Range Status   Specimen Description   Final    BLOOD LEFT HAND Performed at [facility], [address], [place], [place] [address]    Special Requests   Final    BOTTLES DRAWN AEROBIC AND ANAEROBIC Blood Culture results may not be optimal due to an inadequate volume of blood received in culture bottles Performed at [facility], [address], [place], [place] [address]    Culture   Final    NO GROWTH 5 DAYS Performed at [facility], [address], [place], [place] [address]    Report Status 09/30/2023 FINAL  Final  Expectorated Sputum Assessment w Gram Stain, Rflx to Resp Cult     Status: None   Collection Time: 09/26/23  9:03 AM   Specimen: Nasopharyngeal Swab; Sputum  Result Value Ref Range Status   Specimen Description EXPECTORATED SPUTUM  Final   Special Requests NONE  Final   Sputum evaluation   Final    Sputum specimen not acceptable for testing.  Please recollect.   NOTIFIED WHITESELL, C  Performed at [facility], [address], [place], [place] [address]    Report Status 09/26/2023 FINAL  Final  Respiratory (~20 pathogens) panel by PCR     Status: None   Collection Time: 09/26/23  9:36 AM   Specimen: Nasopharyngeal Swab; Respiratory  Result Value Ref Range Status   Adenovirus NOT DETECTED NOT DETECTED Final   Coronavirus 229E NOT DETECTED NOT DETECTED Final    Comment: (NOTE) The Coronavirus on the Respiratory Panel, DOES NOT test for the novel  Coronavirus (2019 nCoV)    Coronavirus HKU1 NOT DETECTED NOT DETECTED Final    Coronavirus NL63 NOT DETECTED NOT DETECTED Final   Coronavirus OC43 NOT DETECTED NOT DETECTED Final   Metapneumovirus NOT DETECTED NOT DETECTED Final   Rhinovirus / Enterovirus NOT DETECTED NOT DETECTED Final   Influenza A NOT DETECTED NOT DETECTED Final   Influenza B NOT DETECTED NOT DETECTED Final   Parainfluenza Virus 1 NOT DETECTED NOT DETECTED Final   Parainfluenza Virus 2 NOT DETECTED NOT DETECTED Final   Parainfluenza Virus 3 NOT DETECTED NOT DETECTED Final   Parainfluenza Virus 4 NOT DETECTED NOT DETECTED Final   Respiratory Syncytial Virus NOT DETECTED NOT DETECTED Final   Bordetella pertussis NOT DETECTED NOT DETECTED Final   Bordetella Parapertussis NOT DETECTED NOT DETECTED Final   Chlamydophila pneumoniae NOT DETECTED NOT DETECTED Final   Mycoplasma pneumoniae NOT DETECTED NOT DETECTED Final    Comment: Performed at [facility], [address], [place], [place] [address]  Culture, Respiratory w Gram Stain     Status: None   Collection Time: 09/26/23  5:27 PM   Specimen: Tracheal Aspirate; Respiratory  Result Value Ref Range Status   Specimen Description TRACHEAL ASPIRATE  Final   Special Requests NONE  Final   Gram Stain   Final    FEW WBC PRESENT, PREDOMINANTLY PMN FEW GRAM POSITIVE COCCI FEW GRAM NEGATIVE RODS    Culture   Final    RARE KLEBSIELLA PNEUMONIAE ABUNDANT DIPHTHEROIDS(CORYNEBACTERIUM  SPECIES) Standardized susceptibility testing for this organism is not available. Performed at [facility], [address] [place], [place] [address]    Report Status 09/29/2023 FINAL  Final   Organism ID, Bacteria KLEBSIELLA PNEUMONIAE  Final      Susceptibility   Klebsiella pneumoniae - MIC*    AMPICILLIN  RESISTANT Resistant     CEFEPIME  <=0.12 SENSITIVE Sensitive     CEFTAZIDIME <=1 SENSITIVE Sensitive     CEFTRIAXONE  <=0.25 SENSITIVE Sensitive     CIPROFLOXACIN <=0.25 SENSITIVE Sensitive     GENTAMICIN <=1 SENSITIVE Sensitive     IMIPENEM <=0.25  SENSITIVE Sensitive     TRIMETH /SULFA  <=20 SENSITIVE Sensitive     AMPICILLIN /SULBACTAM 4 SENSITIVE Sensitive     PIP/TAZO <=4 SENSITIVE Sensitive ug/mL    * RARE KLEBSIELLA PNEUMONIAE  MRSA Next Gen by PCR, Nasal     Status: None   Collection Time: 09/26/23  5:48 PM  Result Value Ref Range Status   MRSA by PCR Next Gen NOT DETECTED NOT DETECTED Final    Comment: (NOTE) The GeneXpert MRSA Assay (FDA approved for NASAL specimens only), is one component of a comprehensive MRSA colonization surveillance program. It is not intended to diagnose MRSA infection nor to guide or monitor treatment for MRSA infections. Test performance is not FDA approved in patients less than 30 years old. Performed at [facility], [address] [place], [place] [address]   MRSA Next Gen by PCR, Nasal     Status: None   Collection Time: 09/26/23  6:31 PM   Specimen: Nasal Mucosa; Nasal Swab  Result Value Ref Range Status   MRSA by PCR Next Gen NOT DETECTED NOT DETECTED Final    Comment: (NOTE) The GeneXpert MRSA Assay (FDA approved for NASAL specimens only), is one component of a comprehensive MRSA colonization surveillance program. It is not intended to diagnose MRSA infection nor to guide or monitor treatment for MRSA infections. Test performance is not FDA approved in patients less than 49 years old. Performed at [facility], [address] [place], [place] [address]      Medications:    acetaminophen  (TYLENOL ) oral liquid 160 mg/5 mL  650 mg Per Tube Q6H   [START ON 10/03/2023] amoxicillin -clavulanate  1 tablet Per Tube Q12H   Followed by   Cecily Cohen ON 10/17/2023] amoxicillin -clavulanate  1 tablet Oral BID   carbamide peroxide  5 drop Both EARS BID   Chlorhexidine  Gluconate Cloth  6 each Topical Daily   enoxaparin  (LOVENOX ) injection  40 mg Subcutaneous Q24H   feeding supplement (PROSource TF20)  60 mL Per Tube Daily   fentaNYL   1 patch Transdermal Q48H   gabapentin    300 mg Per Tube Q12H   insulin  aspart  0-9 Units Subcutaneous Q4H   insulin  aspart  2 Units Subcutaneous Q4H   insulin  glargine-yfgn  5 Units Subcutaneous Daily   lactobacillus  1 g Per Tube TID WC   naproxen   500 mg Per Tube BID WC   mouth rinse  15 mL Mouth Rinse 4 times per day   pantoprazole  (PROTONIX ) IV  40 mg Intravenous Daily   polyethylene glycol  17 g Per Tube Daily   senna  1 tablet Per Tube Daily   sodium chloride  flush  10-40 mL Intracatheter Q12H   Continuous Infusions:  sodium chloride  10 mL/hr at 10/02/23 0600   ampicillin -sulbactam (UNASYN ) IV Stopped (10/02/23 0543)   feeding supplement (OSMOLITE 1.5 CAL) 80 mL/hr at  10/02/23 0600      LOS: 7 days   Macdonald Savoy  [facility]  10/02/2023, 8:22 AM

## 2023-10-02 NOTE — Progress Notes (Signed)
 PT Cancellation Note  Patient Details Name: Jesus [name] MRN: [id number] DOB: Feb 25, 1956   Cancelled Treatment:    Reason Eval/Treat Not Completed: Other (comment) Pt reports fatigued.  Request tomorrow for therapy.  Will f/u as able.  Bijou Easler, PT Acute Rehab [facility] [phone]   Carolynn Citrin 10/02/2023, 4:04 PM

## 2023-10-03 DIAGNOSIS — C329 Malignant neoplasm of larynx, unspecified: Secondary | ICD-10-CM | POA: Diagnosis not present

## 2023-10-03 DIAGNOSIS — C8338 Diffuse large B-cell lymphoma, lymph nodes of multiple sites: Secondary | ICD-10-CM | POA: Diagnosis not present

## 2023-10-03 DIAGNOSIS — G894 Chronic pain syndrome: Secondary | ICD-10-CM | POA: Diagnosis not present

## 2023-10-03 DIAGNOSIS — J189 Pneumonia, unspecified organism: Secondary | ICD-10-CM | POA: Diagnosis not present

## 2023-10-03 DIAGNOSIS — D638 Anemia in other chronic diseases classified elsewhere: Secondary | ICD-10-CM | POA: Diagnosis not present

## 2023-10-03 LAB — GLUCOSE, CAPILLARY
Glucose-Capillary: 162 mg/dL — ABNORMAL HIGH (ref 70–99)
Glucose-Capillary: 178 mg/dL — ABNORMAL HIGH (ref 70–99)
Glucose-Capillary: 179 mg/dL — ABNORMAL HIGH (ref 70–99)
Glucose-Capillary: 181 mg/dL — ABNORMAL HIGH (ref 70–99)
Glucose-Capillary: 184 mg/dL — ABNORMAL HIGH (ref 70–99)
Glucose-Capillary: 187 mg/dL — ABNORMAL HIGH (ref 70–99)

## 2023-10-03 NOTE — Progress Notes (Signed)
 Physical Therapy Treatment Patient Details Name: Jesus [name] MRN: [id number] DOB: 1955/07/10 Today's Date: 10/03/2023   History of Present Illness Pt is 68 year old presented to [facility] from the cancer center on  09/25/23 for hypoxia and tachycardia. Pt with sepsis due to PNA. 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 Pt transferred to [facility] on 09/26/23. Pt from [facility] where he has been undergoing rehab after hospitalization and emergent trach at [facility] on 08/03/23. PMH - trach, laryngeal CA, Gtube, lymphoma, HTN, copd, chf    PT Comments  Pt resting in bed on arrival and agreeable to session with great progress towards acute mobility goals. Pt demonstrating increased activity tolerance this session, progressing gait with RW for support and up to min A to steady as pt with x1 posterior LOB needing external assist to correct. Pt able to complete transfers with grossly CGA - min A with cues for safe hand placement. Pt with increased coughing and secretions from trach this session with RT in during session and aware. Pt continues to be limited in safe mobility by global weakness, decreased activity tolerance, impaired balance/postural reactions and decreased cardiopulmonary endurance and  will benefit from continued inpatient follow up therapy, <3 hours/day. Will continue to follow acutely to progress towards functional mobility goals.    If plan is discharge home, recommend the following: A lot of help with bathing/dressing/bathroom;A lot of help with walking and/or transfers;Assistance with cooking/housework;Assist for transportation;Help with stairs or ramp for entrance   Can travel by private vehicle        Equipment Recommendations  None recommended by PT    Recommendations for Other Services       Precautions / Restrictions Precautions Precautions: Fall;Other (comment) Recall of Precautions/Restrictions: Intact Precaution/Restrictions Comments: trach, gtube (trach is very sensitive be careful  when moving it or placing him on O2) Restrictions Weight Bearing Restrictions Per Provider Order: No     Mobility  Bed Mobility Overal bed mobility: Needs Assistance Bed Mobility: Supine to Sit, Sit to Supine     Supine to sit: Contact guard Sit to supine: Contact guard assist   General bed mobility comments: slightly increased time and effort to return BLEs to bed at end of session    Transfers Overall transfer level: Needs assistance Equipment used: Rolling walker (2 wheels), None Transfers: Sit to/from Stand, Bed to chair/wheelchair/BSC Sit to Stand: Contact guard assist, Min assist           General transfer comment: min A to steady as pt standing withuot RW support initially, CGA with RW, cues for hand placement    Ambulation/Gait Ambulation/Gait assistance: Contact guard assist, Min assist Gait Distance (Feet): 75 Feet Assistive device: Rolling walker (2 wheels) Gait Pattern/deviations: Step-through pattern, Narrow base of support, Decreased stride length Gait velocity: decreased     General Gait Details: shuffling steps with low clearance, cues for upright trunk and closer RW proximity, pt with x1 posterior LOB needing min A to correct, some knee flexion with fatigue   Stairs             Wheelchair Mobility     Tilt Bed    Modified Rankin (Stroke Patients Only)       Balance Overall balance assessment: Needs assistance Sitting-balance support: No upper extremity supported, Feet supported Sitting balance-Leahy Scale: Good     Standing balance support: Bilateral upper extremity supported, Reliant on assistive device for balance, During functional activity Standing balance-Leahy Scale: Fair Standing balance comment: no overt LOB  Communication Communication Communication: Impaired Factors Affecting Communication: Trach/intubated  Cognition Arousal: Alert Behavior During Therapy: WFL for tasks  assessed/performed   PT - Cognitive impairments: No apparent impairments                       PT - Cognition Comments: Pt typing on phone to communicate Following commands: Intact      Cueing Cueing Techniques: Verbal cues  Exercises      General Comments General comments (skin integrity, edema, etc.): VSS on supplemental O2      Pertinent Vitals/Pain Pain Assessment Pain Assessment: Faces Faces Pain Scale: Hurts little more Pain Location: throat, neck Pain Descriptors / Indicators: Grimacing, Guarding Pain Intervention(s): Monitored during session, Limited activity within patient's tolerance    Home Living                          Prior Function            PT Goals (current goals can now be found in the care plan section) Acute Rehab PT Goals Patient Stated Goal: not stated PT Goal Formulation: With patient Time For Goal Achievement: 10/11/23 Progress towards PT goals: Progressing toward goals    Frequency    Min 2X/week      PT Plan      Co-evaluation              AM-PAC PT "6 Clicks" Mobility   Outcome Measure  Help needed turning from your back to your side while in a flat bed without using bedrails?: A Little Help needed moving from lying on your back to sitting on the side of a flat bed without using bedrails?: A Little Help needed moving to and from a bed to a chair (including a wheelchair)?: A Little Help needed standing up from a chair using your arms (e.g., wheelchair or bedside chair)?: A Little Help needed to walk in hospital room?: A Little Help needed climbing 3-5 steps with a railing? : A Lot 6 Click Score: 17    End of Session Equipment Utilized During Treatment: Oxygen Activity Tolerance: Patient tolerated treatment well Patient left: in bed;with call bell/phone within reach;with bed alarm set Nurse Communication: Mobility status PT Visit Diagnosis: Other abnormalities of gait and mobility (R26.89);Muscle  weakness (generalized) (M62.81);Pain Pain - part of body:  (neck, throat and upper back)     Time: 1610-9604 PT Time Calculation (min) (ACUTE ONLY): 40 min  Charges:    $Gait Training: 8-22 mins $Therapeutic Activity: 23-37 mins PT General Charges $$ ACUTE PT VISIT: 1 Visit                     Patrizia Paule R. PTA Acute Rehabilitation Services Office: [phone]   Agapito Horseman 10/03/2023, 4:05 PM

## 2023-10-03 NOTE — Progress Notes (Signed)
 [facility] PROGRESS NOTE    Progress Note  Jesus [name]  ZOX:[id number] DOB: 19-Aug-1955 DOA: 09/25/2023 PCP: [name], MD    Brief Narrative:   Jesus [name] is an 68 y.o. male past medical history of diffuse large B-cell lymphoma, metastatic laryngeal squamous carcinoma peritracheal mass status post tracheostomy on 08/03/2023 by thoracic surgery at [facility], essential hypertension chronic diastolic dysfunction anemia of chronic disease transferred from [facility] due to respiratory distress upon arrival was found tachycardic tachypneic and requiring 6 L of oxygen had a temperature of 102, with purulent tracheal secretions white count of 12 admitted on the triad service for sepsis secondary to pneumonia transferred to PCCM on 09/26/2023 CT scan of the neck revealed extensive debris encompassing the tracheostomy affected by tumor and necrotic tumor with trach esophageal fistula.  Transfer back to [facility] 10/01/2023 on empiric antibiotics he is to complete a 7-day course of IV Unasyn  followed by 14 days of Augmentin  for suppressive therapy.  Assessment/Plan:   Sepsis secondary to Multifocal pneumonia/acute respiratory failure with hypoxia secondary to malignancy: He completed 7-day course of IV Unasyn , now will continue 14-day course of Augmentin  for suppressive therapy. PCCM and ENT, relate infection may lead to destructive airway compromise and tumor will continue to progress without chemotherapy which she is not a candidate to receive due to his infection. 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 Follow-up with oncology as an outpatient. Oncology evaluated the patient on this admission and recommended moving towards hospice care. Palliative care met with family and patient, they decided to continue aggressive treatment, he is a full code. He wanted to go to skilled nursing facility  or LTAC.  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HCC)/with paratracheal mass status post tracheostomy on 08/03/2023 at [facility] / metastatic glandular squamous cell carcinoma: Oncology was consulted he was previously seen by Dr. Elvan Hamel and Dr. Teofilo Fellers who did not feel intervention were possible he was seen at [facility] and was started on carboplatin  and Taxol. ENT was involved in the care and there is no good surgical repair option nor good options to replace the current tracheostomy tube without risking airway loss though he is not a candidate for surgical intervention. He is not a candidate for chemotherapy in the setting of infection. Palliative care was consulted and family and patient wants to continue aggressive care. Physicians have explained to the patient the severity of the case and they have unrealistic expectations. the patient understands the situation.  Anemia of chronic disease: Hemoglobin today is 7.2.  Check a CBC tomorrow morning. Likely multifactorial secondary to malignancy and poor nutrition. Will transfuse if hemoglobin less than 7 or symptomatic.  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  Thrombocytosis: Likely reactive.  They are improving slowly.  Essential hypertension/hyperlipidemia/chronic diastolic dysfunction: Continue supportive care.  G-tube inserted on 06/21/2023. Noted.  RN Pressure Injury Documentation: Pressure Injury 09/25/23 Coccyx Stage 1 -  Intact skin with non-blanchable redness of a localized area usually over a bony prominence. in gluteal cleft and bottom of sacrum/coccyx (Active)  09/25/23 2150  Location: Coccyx  Location Orientation:   Staging: Stage 1 -  Intact skin with non-blanchable redness of a localized area usually over a bony prominence.  Wound Description (Comments): in gluteal cleft and bottom of sacrum/coccyx  Present on Admission: Yes  Dressing Type Foam - Lift dressing to assess site every shift 10/02/23 2000  DVT prophylaxis: lovenox  Family Communication:none Status is: Inpatient Remains inpatient appropriate because: Sepsis due to pneumonia    Code Status:     Code Status Orders  (From admission, onward)           Start     Ordered   09/25/23 2326  Full code  Continuous       Question:  By:  Answer:  Consent: discussion documented in EHR   09/25/23 2327           Code Status History     Date Active Date Inactive Code Status Order ID Comments User Context   07/29/2023 0830 07/31/2023 2300 Full Code [id number]  Danice Dural, MD ED   07/15/2023 0249 07/17/2023 2249 Full Code [id number]  Howerter, Gattis Kass, DO ED   07/02/2023 2323 07/05/2023 1755 Full Code [id number]  Howerter, Gattis Kass, DO ED   06/16/2023 0122 06/24/2023 1813 Full Code [id number]  Corrinne Din, MD ED   06/16/2023 0119 06/16/2023 0122 Full Code [id number]  Corrinne Din, MD ED   06/16/2023 0119 06/16/2023 0119 Limited: Do not attempt resuscitation (DNR) -DNR-LIMITED -Do Not Intubate/DNI  [id number]  Corrinne Din, MD ED   08/10/2022 1620 08/11/2022 0514 Full Code [id number]  Marland Silvas, MD HOV   05/28/2018 1307 06/01/2018 1637 Full Code [id number]  Frankie Israel, MD Inpatient   05/10/2018 1426 05/14/2018 1559 Full Code [id number]  Frankie Israel, MD Inpatient   04/17/2018 0040 04/20/2018 1907 Full Code [id number]  Fidencio Hue, MD Inpatient   04/16/2018 1531 04/17/2018 0039 Full Code [id number]  Frankie Israel, MD Inpatient   03/27/2018 1252 03/31/2018 1820 Full Code [id number]  Frankie Israel, MD Inpatient   02/26/2018 1030 03/02/2018 1650 Full Code [id number]  Frankie Israel, MD Inpatient   02/05/2018 1100 02/09/2018 1607 Full Code [id number]  Frankie Israel, MD Inpatient   01/15/2018 1126 01/19/2018 2134 Full Code [id number]  Frankie Israel, MD Inpatient         IV Access:   Peripheral IV   Procedures and diagnostic studies:   No results found.   Medical Consultants:    None.   Subjective:    Jesus [name] no complaints today  Objective:    Vitals:   10/02/23 2353 10/03/23 0307 10/03/23 0353 10/03/23 0400  BP: 129/81  111/74   Pulse: 89 94 86 95  Resp: 15 18 15 18   Temp: 98.2 F (36.8 C)  98.4 F (36.9 C)   TempSrc: Oral  Oral   SpO2: 100% 96% 97% 98%  Weight:   102.5 kg   Height:       SpO2: 98 % O2 Flow Rate (L/min): 40 L/min FiO2 (%): 35 %   Intake/Output Summary (Last 24 hours) at 10/03/2023 0812 Last data filed at 10/03/2023 0507 Gross per 24 hour  Intake 2078.32 ml  Output 1100 ml  Net 978.32 ml   Filed Weights   10/01/23 0324 10/02/23 0545 10/03/23 0353  Weight: 101.1 kg 103.5 kg 102.5 kg    Exam: General exam: In no acute distress. Respiratory system: Good air movement and clear to auscultation.  Trach in place Cardiovascular system: S1 & S2 heard, RRR. No JVD. Gastrointestinal system: Abdomen is nondistended, soft and nontender.  Extremities: No pedal edema. Skin: No rashes, lesions or ulcers Psychiatry: Judgement and insight appear normal. Mood & affect appropriate.  Data Reviewed:    Labs: Basic Metabolic Panel: Recent Labs  Lab 09/27/23 0500 09/27/23 1325 09/28/23 0328 09/29/23  0405 09/30/23 0505 10/01/23 0311 10/02/23 0350  NA 135   < > 133* 134* 135 135 137  K 4.3   < > 4.3 4.4 4.6 4.6 4.8  CL 95*   < > 95* 99 99 98 98  CO2 27   < > 28 28 27 30 31   GLUCOSE 110*   < > 224* 225* 212* 185* 162*  BUN 18   < > 18 19 16 16 14   CREATININE 0.89   < > 0.92 0.78 0.71 0.81 0.88  CALCIUM  8.9   < > 8.7* 8.5* 8.3* 8.6* 8.5*  MG 1.8  --  2.1 1.9 1.7 2.1 1.9  PHOS 4.1  --   --   --   --   --   --    < > = values in this interval not displayed.   GFR Estimated Creatinine Clearance: 100.9 mL/min (by C-G formula based on SCr of 0.88 mg/dL). Liver Function Tests: Recent Labs  Lab 09/27/23 1325  AST 11*  ALT 11  ALKPHOS 120  BILITOT 0.7  PROT 6.5  ALBUMIN 1.9*   No results for input(s): "LIPASE",  "AMYLASE" in the last 168 hours. No results for input(s): "AMMONIA" in the last 168 hours. Coagulation profile No results for input(s): "INR", "PROTIME" in the last 168 hours.  COVID-19 Labs  No results for input(s): "DDIMER", "FERRITIN", "LDH", "CRP" in the last 72 hours.  Lab Results  Component Value Date   SARSCOV2NAA NEGATIVE 09/25/2023   SARSCOV2NAA NEGATIVE 07/29/2023   SARSCOV2NAA NEGATIVE 07/15/2023   SARSCOV2NAA NEGATIVE 07/11/2023    CBC: Recent Labs  Lab 09/28/23 0339 09/29/23 0405 09/30/23 0505 10/01/23 0311 10/02/23 0350  WBC 11.5* 10.9* 7.4 8.7 7.9  NEUTROABS 9.0*  --   --   --   --   HGB 7.5* 7.4* 7.1* 7.5* 7.2*  HCT 24.6* 24.2* 24.2* 24.0* 24.0*  MCV 86.9 88.0 88.6 87.0 86.6  PLT 571* 574* 619* 710* 689*   Cardiac Enzymes: No results for input(s): "CKTOTAL", "CKMB", "CKMBINDEX", "TROPONINI" in the last 168 hours. BNP (last 3 results) No results for input(s): "PROBNP" in the last 8760 hours. CBG: Recent Labs  Lab 10/02/23 1137 10/02/23 1552 10/02/23 2017 10/02/23 2359 10/03/23 0357  GLUCAP 174* 146* 184* 187* 179*   D-Dimer: No results for input(s): "DDIMER" in the last 72 hours. Hgb A1c: No results for input(s): "HGBA1C" in the last 72 hours. Lipid Profile: No results for input(s): "CHOL", "HDL", "LDLCALC", "TRIG", "CHOLHDL", "LDLDIRECT" in the last 72 hours. Thyroid  function studies: No results for input(s): "TSH", "T4TOTAL", "T3FREE", "THYROIDAB" in the last 72 hours.  Invalid input(s): "FREET3" Anemia work up: No results for input(s): "VITAMINB12", "FOLATE", "FERRITIN", "TIBC", "IRON", "RETICCTPCT" in the last 72 hours. Sepsis Labs: Recent Labs  Lab 09/29/23 0405 09/30/23 0505 10/01/23 0311 10/02/23 0350  WBC 10.9* 7.4 8.7 7.9   Microbiology Recent Results (from the past 240 hours)  Blood Culture (routine x 2)     Status: None   Collection Time: 09/25/23  1:52 PM   Specimen: BLOOD  Result Value Ref Range Status   Specimen  Description   Final    BLOOD RIGHT ANTECUBITAL Performed at [facility], [address], [place], [place] [address]    Special Requests   Final    BOTTLES DRAWN AEROBIC AND ANAEROBIC Blood Culture adequate volume Performed at [facility], [address] [place], [place] [address]    Culture   Final  NO GROWTH 5 DAYS Performed at [facility], [address], [place], [place] [address]    Report Status 09/30/2023 FINAL  Final  Resp panel by RT-PCR (RSV, Flu A&B, Covid) Anterior Nasal Swab     Status: None   Collection Time: 09/25/23  3:02 PM   Specimen: Anterior Nasal Swab  Result Value Ref Range Status   SARS Coronavirus 2 by RT PCR NEGATIVE NEGATIVE Final    Comment: (NOTE) SARS-CoV-2 target nucleic acids are NOT DETECTED.  The SARS-CoV-2 RNA is generally detectable in upper respiratory specimens during the acute phase of infection. The lowest concentration of SARS-CoV-2 viral copies this assay can detect is 138 copies/mL. A negative result does not preclude SARS-Cov-2 infection and should not be used as the sole basis for treatment or other patient management decisions. A negative result may occur with  improper specimen collection/handling, submission of specimen other than nasopharyngeal swab, presence of viral mutation(s) within the areas targeted by this assay, and inadequate number of viral copies(<138 copies/mL). A negative result must be combined with clinical observations, patient history, and epidemiological information. The expected result is Negative.  Fact Sheet for Patients:  BloggerCourse.com  Fact Sheet for Healthcare Providers:  SeriousBroker.it  This test is no t yet approved or cleared by the [place]  FDA and  has been authorized for detection and/or diagnosis of SARS-CoV-2 by FDA under an Emergency Use Authorization (EUA). This EUA will remain  in  effect (meaning this test can be used) for the duration of the COVID-19 declaration under Section 564(b)(1) of the Act, 21 U.S.C.section 360bbb-3(b)(1), unless the authorization is terminated  or revoked sooner.       Influenza A by PCR NEGATIVE NEGATIVE Final   Influenza B by PCR NEGATIVE NEGATIVE Final    Comment: (NOTE) The Xpert Xpress SARS-CoV-2/FLU/RSV plus assay is intended as an aid in the diagnosis of influenza from Nasopharyngeal swab specimens and should not be used as a sole basis for treatment. Nasal washings and aspirates are unacceptable for Xpert Xpress SARS-CoV-2/FLU/RSV testing.  Fact Sheet for Patients: BloggerCourse.com  Fact Sheet for Healthcare Providers: SeriousBroker.it  This test is not yet approved or cleared by the [place]  FDA and has been authorized for detection and/or diagnosis of SARS-CoV-2 by FDA under an Emergency Use Authorization (EUA). This EUA will remain in effect (meaning this test can be used) for the duration of the COVID-19 declaration under Section 564(b)(1) of the Act, 21 U.S.C. section 360bbb-3(b)(1), unless the authorization is terminated or revoked.     Resp Syncytial Virus by PCR NEGATIVE NEGATIVE Final    Comment: (NOTE) Fact Sheet for Patients: BloggerCourse.com  Fact Sheet for Healthcare Providers: SeriousBroker.it  This test is not yet approved or cleared by the [place]  FDA and has been authorized for detection and/or diagnosis of SARS-CoV-2 by FDA under an Emergency Use Authorization (EUA). This EUA will remain in effect (meaning this test can be used) for the duration of the COVID-19 declaration under Section 564(b)(1) of the Act, 21 U.S.C. section 360bbb-3(b)(1), unless the authorization is terminated or revoked.  Performed at [facility], [address] [place], [place] [address]    Blood Culture (routine x 2)     Status: None   Collection Time: 09/25/23  3:33 PM   Specimen: BLOOD LEFT HAND  Result Value Ref Range Status   Specimen Description   Final    BLOOD LEFT HAND Performed at [facility], [address], [place], [place] [address]    Special Requests   Final    BOTTLES DRAWN AEROBIC AND ANAEROBIC Blood Culture results may not be optimal due to an inadequate volume of blood received in culture bottles Performed at [facility], [address], [place], [place] [address]    Culture   Final    NO GROWTH 5 DAYS Performed at [facility], [address] [place], [place] [address]    Report Status 09/30/2023 FINAL  Final  Expectorated Sputum Assessment w Gram Stain, Rflx to Resp Cult     Status: None   Collection Time: 09/26/23  9:03 AM   Specimen: Nasopharyngeal Swab; Sputum  Result Value Ref Range Status   Specimen Description EXPECTORATED SPUTUM  Final   Special Requests NONE  Final   Sputum evaluation   Final    Sputum specimen not acceptable for testing.  Please recollect.   NOTIFIED WHITESELL, C  Performed at [facility], [address] [place], [place] [address]    Report Status 09/26/2023 FINAL  Final  Respiratory (~20 pathogens) panel by PCR     Status: None   Collection Time: 09/26/23  9:36 AM   Specimen: Nasopharyngeal Swab; Respiratory  Result Value Ref Range Status   Adenovirus NOT DETECTED NOT DETECTED Final   Coronavirus 229E NOT DETECTED NOT DETECTED Final    Comment: (NOTE) The Coronavirus on the Respiratory Panel, DOES NOT test for the novel  Coronavirus (2019 nCoV)    Coronavirus HKU1 NOT DETECTED NOT DETECTED Final   Coronavirus NL63 NOT DETECTED NOT DETECTED Final   Coronavirus OC43 NOT DETECTED NOT DETECTED Final   Metapneumovirus NOT DETECTED NOT DETECTED Final   Rhinovirus / Enterovirus NOT DETECTED NOT DETECTED Final   Influenza A NOT DETECTED NOT DETECTED Final    Influenza B NOT DETECTED NOT DETECTED Final   Parainfluenza Virus 1 NOT DETECTED NOT DETECTED Final   Parainfluenza Virus 2 NOT DETECTED NOT DETECTED Final   Parainfluenza Virus 3 NOT DETECTED NOT DETECTED Final   Parainfluenza Virus 4 NOT DETECTED NOT DETECTED Final   Respiratory Syncytial Virus NOT DETECTED NOT DETECTED Final   Bordetella pertussis NOT DETECTED NOT DETECTED Final   Bordetella Parapertussis NOT DETECTED NOT DETECTED Final   Chlamydophila pneumoniae NOT DETECTED NOT DETECTED Final   Mycoplasma pneumoniae NOT DETECTED NOT DETECTED Final    Comment: Performed at [facility], [address], [place], [place] [address]  Culture, Respiratory w Gram Stain     Status: None   Collection Time: 09/26/23  5:27 PM   Specimen: Tracheal Aspirate; Respiratory  Result Value Ref Range Status   Specimen Description TRACHEAL ASPIRATE  Final   Special Requests NONE  Final   Gram Stain   Final    FEW WBC PRESENT, PREDOMINANTLY PMN FEW GRAM POSITIVE COCCI FEW GRAM NEGATIVE RODS    Culture   Final    RARE KLEBSIELLA PNEUMONIAE ABUNDANT DIPHTHEROIDS(CORYNEBACTERIUM SPECIES) Standardized susceptibility testing for this organism is not available. Performed at [facility], [address], [place], [place] [address]    Report Status 09/29/2023 FINAL  Final   Organism ID, Bacteria KLEBSIELLA PNEUMONIAE  Final      Susceptibility   Klebsiella pneumoniae - MIC*    AMPICILLIN  RESISTANT Resistant     CEFEPIME  <=0.12 SENSITIVE Sensitive     CEFTAZIDIME <=1 SENSITIVE Sensitive     CEFTRIAXONE  <=0.25 SENSITIVE Sensitive     CIPROFLOXACIN <=0.25  SENSITIVE Sensitive     GENTAMICIN <=1 SENSITIVE Sensitive     IMIPENEM <=0.25 SENSITIVE Sensitive     TRIMETH /SULFA  <=20 SENSITIVE Sensitive     AMPICILLIN /SULBACTAM 4 SENSITIVE Sensitive     PIP/TAZO <=4 SENSITIVE Sensitive ug/mL    * RARE KLEBSIELLA PNEUMONIAE  MRSA Next Gen by PCR, Nasal     Status: None   Collection Time: 09/26/23   5:48 PM  Result Value Ref Range Status   MRSA by PCR Next Gen NOT DETECTED NOT DETECTED Final    Comment: (NOTE) The GeneXpert MRSA Assay (FDA approved for NASAL specimens only), is one component of a comprehensive MRSA colonization surveillance program. It is not intended to diagnose MRSA infection nor to guide or monitor treatment for MRSA infections. Test performance is not FDA approved in patients less than 86 years old. Performed at [facility], [address], [place], [place] [address]   MRSA Next Gen by PCR, Nasal     Status: None   Collection Time: 09/26/23  6:31 PM   Specimen: Nasal Mucosa; Nasal Swab  Result Value Ref Range Status   MRSA by PCR Next Gen NOT DETECTED NOT DETECTED Final    Comment: (NOTE) The GeneXpert MRSA Assay (FDA approved for NASAL specimens only), is one component of a comprehensive MRSA colonization surveillance program. It is not intended to diagnose MRSA infection nor to guide or monitor treatment for MRSA infections. Test performance is not FDA approved in patients less than 58 years old. Performed at [facility], [address] [place], [place] [address]      Medications:    acetaminophen  (TYLENOL ) oral liquid 160 mg/5 mL  650 mg Per Tube Q6H   amoxicillin -clavulanate  1 tablet Per Tube Q12H   Followed by   Cecily Cohen ON 10/17/2023] amoxicillin -clavulanate  1 tablet Oral BID   carbamide peroxide  5 drop Both EARS BID   Chlorhexidine  Gluconate Cloth  6 each Topical Daily   enoxaparin  (LOVENOX ) injection  40 mg Subcutaneous Q24H   feeding supplement (PROSource TF20)  60 mL Per Tube Daily   fentaNYL   1 patch Transdermal Q48H   gabapentin   300 mg Per Tube Q12H   insulin  aspart  0-9 Units Subcutaneous Q4H   insulin  aspart  2 Units Subcutaneous Q4H   insulin  glargine-yfgn  5 Units Subcutaneous Daily   lactobacillus  1 g Per Tube TID WC   naproxen   500 mg Per Tube BID WC   mouth rinse  15 mL Mouth Rinse 4 times per day    pantoprazole  (PROTONIX ) IV  40 mg Intravenous Daily   polyethylene glycol  17 g Per Tube Daily   senna  1 tablet Per Tube Daily   sodium chloride  flush  10-40 mL Intracatheter Q12H   Continuous Infusions:  feeding supplement (OSMOLITE 1.5 CAL) 1,000 mL (10/03/23 0507)      LOS: 8 days   Macdonald Savoy  [facility]  10/03/2023, 8:12 AM

## 2023-10-03 NOTE — Progress Notes (Signed)
 This chaplain responded to the PMT PA-Josseline's consult for creating/updating the Pt. Advance Directive. The chaplain received an update from the Pt. RN-Casey before the visit.  The chaplain understands from today's visit the Pt. prefers his daughter's presence at the bedside during AD education. The Pt. gave the chaplain permission to phone the Pt. daughter to schedule a time to visit.   The chaplain phoned the Pt. daughter-[name]. Time together to discuss the Pt. Advance Directive was scheduled for 9am on Wednesday.  Chaplain Kathleene Papas [phone]

## 2023-10-03 NOTE — TOC Progression Note (Addendum)
 Transition of Care [facility]) - Progression Note    Patient Details  Name: Jesus [name] MRN: [id number] Date of Birth: [birth year]  [facility] CM/SW Contact  Dane Dung, RN Phone Number: 10/03/2023, 11:12 AM  Clinical Narrative:    CM called and spoke with Rosita Cools, CM with [facility] and she will review the patient to see if patient would qualify for LTAC admission - undetermined at this time and currently under review.  10/03/23 1446 - I spoke with Rosita Cools, CM with [facility] and patient meets standards for admission/ bed offer to the unit - [place], CM will start insurance authorization for admission.  Dr. Versa Gore is aware and Arlester Ladd, NP was included in the update as well.   Expected Discharge Plan: Skilled Nursing Facility Barriers to Discharge: Continued Medical Work up  Expected Discharge Plan and Services In-house Referral: Clinical Social Work   Post Acute Care Choice: Skilled Nursing Facility Living arrangements for the past 2 months: Single Family Home, Skilled Nursing Facility                                       Social Determinants of Health (SDOH) Interventions SDOH Screenings   Food Insecurity: Food Insecurity Present (09/25/2023)  Housing: High Risk (09/25/2023)  Transportation Needs: No Transportation Needs (09/25/2023)  Recent Concern: Transportation Needs - Unmet Transportation Needs (07/29/2023)  Utilities: Not At Risk (09/25/2023)  Depression (PHQ2-9): Low Risk  (11/09/2022)  Financial Resource Strain: Low Risk  (10/06/2020)  Social Connections: Moderately Isolated (09/25/2023)  Tobacco Use: Medium Risk (09/25/2023)    Readmission Risk Interventions    07/31/2023    4:28 PM 07/17/2023    3:08 PM 07/05/2023   11:49 AM  Readmission Risk Prevention Plan  Transportation Screening Complete Complete Complete  Medication Review Oceanographer) Complete Complete Complete  PCP or Specialist  appointment within 3-5 days of discharge Complete Complete Complete  HRI or Home Care Consult Complete Complete Complete  SW Recovery Care/Counseling Consult Complete Complete Complete  Palliative Care Screening Not Applicable Not Applicable Not Applicable  Skilled Nursing Facility Not Applicable Not Applicable Not Applicable

## 2023-10-03 NOTE — Progress Notes (Signed)
 [name]   DOB:04-09-1956   XB#:[id number]      ASSESSMENT & PLAN:  Jesus [name] is a 68 year old male patient who was admitted on [facility]/25/2023 from [facility] with chief complaint of respiratory distress.   Patient has a history of 3 cancers: Recently diagnosed laryngeal cancer, status post trach, diffuse large B-cell lymphoma, and vocal cord squamous cell carcinoma.  He is followed in outpatient oncology by Dr. Salomon Cree.  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 Wilson and Kiln who did not feel local interventions were possible here.  Sent to [facility] and was started on carboplatin  and Taxol. - S/p radiation therapy 4 cycles.  -Palliative systemic therapies (likely platinum based chemotherapy + cetuximab) were planned although are not possible in the context of infection.  Systemic therapy on hold for now. -No surgical repair option per ENT/Dr. VandeGriend.   --Recommend Palliative for goals of care discussions - Patient seen in outpatient oncology office on 07/25/2023, discussed with the patient at that time radiation treatment and treatment change to Keytruda .  After radiation treatment consideration for platinum based chemotherapy plus cetuximab.  Planned for 09/25/2023, started at [facility] as noted above.   - Medical oncology/Dr. Salomon Cree following and will make further evaluation and treatment recommendations.  History of large B-cell lymphoma, stage IV - Patient initially seen 2019 with extensive lymphadenopathy multiple osseous lesions.  Left inguinal lymph node biopsy revealed T-cell/histiocyte rich large B-cell lymphoma.  History of vocal cord squamous cell carcinoma - Status post definitive radiation - Continue follow-up with ENT and radiation oncology for surveillance  Respiratory distress Pneumonia Dysphagia  - Secondary to malignancy  --  Admitted with purulent trach secretions -- G-tube inserted 06/21/23 -- s/p tracheostomy 08/03/23 [facility].  Trach intact, appears stable - Continue antibiotics as ordered - Continue supportive care and monitor respiratory status closely  Anemia - Likely multifactorial - Last hemoglobin low 7.2, recommend repeat cbcd in am and if remains low, please give one unit prbc transfusion.  - Recommend PRBC transfusion for Hgb <7.0 - Continue to monitor CBC with differential closely  Thrombocytosis - Likely reactive to anemia - No intervention required at this time. - Continue to monitor CBC with differential closely    Code Status Full  Subjective:  Patient seen awake and alert laying in bed. Reports pain in throat area.  Trach remains intact.  Breathing stable. No acute distress is noted.   Objective:   Intake/Output Summary (Last 24 hours) at 10/03/2023 1346 Last data filed at 10/03/2023 0507 Gross per 24 hour  Intake 1878.32 ml  Output 1100 ml  Net 778.32 ml     PHYSICAL EXAMINATION: ECOG PERFORMANCE STATUS: 4 - Bedbound  Vitals:   10/03/23 1029 10/03/23 1100  BP: 132/79   Pulse: 87 88  Resp:  16  Temp: 97.9 F (36.6 C)   SpO2: 100% 100%   Filed Weights   10/01/23 0324 10/02/23 0545 10/03/23 0353  Weight: 222 lb 14.2 oz (101.1 kg) 228 lb 2.8 oz (103.5 kg) 225 lb 15.5 oz (102.5 kg)    GENERAL: alert, +chronically ill-appearing SKIN: skin color, texture, turgor are normal, no rashes or significant lesions EYES: normal, conjunctiva are pink and non-injected, sclera clear OROPHARYNX: +trach intact NECK: supple, thyroid  normal size, non-tender, without nodularity LYMPH: no palpable lymphadenopathy in the cervical, axillary or inguinal LUNGS: clear to auscultation and percussion with normal  breathing effort HEART: regular rate & rhythm and no murmurs and no lower extremity edema ABDOMEN: +g-tube with feeding ongoing MUSCULOSKELETAL: no cyanosis of digits and no  clubbing  PSYCH: alert & oriented x 3 with fluent speech NEURO: no focal motor/sensory deficits   All questions were answered. The patient knows to call the clinic with any problems, questions or concerns.   The total time spent in the appointment was 40 minutes encounter with patient including review of chart and various tests results, discussions about plan of care and coordination of care plan  Jacqualin Mate, NP 10/03/2023 1:46 PM    Labs Reviewed:  Lab Results  Component Value Date   WBC 7.9 10/02/2023   HGB 7.2 (L) 10/02/2023   HCT 24.0 (L) 10/02/2023   MCV 86.6 10/02/2023   PLT 689 (H) 10/02/2023   Recent Labs    09/25/23 1352 09/26/23 0413 09/27/23 0500 09/27/23 1325 09/28/23 0328 09/30/23 0505 10/01/23 0311 10/02/23 0350  NA 133* 134*   < > 134*   < > 135 135 137  K 4.9 4.4   < > 3.9   < > 4.6 4.6 4.8  CL 89* 95*   < > 95*   < > 99 98 98  CO2 32 30   < > 27   < > 27 30 31   GLUCOSE 186* 136*   < > 98   < > 212* 185* 162*  BUN 29* 23   < > 16   < > 16 16 14   CREATININE 0.88 0.74   < > 0.85   < > 0.71 0.81 0.88  CALCIUM  9.3 8.7*   < > 8.7*   < > 8.3* 8.6* 8.5*  GFRNONAA >60 >60   < > >60   < > >60 >60 >60  PROT 8.2* 6.6  --  6.5  --   --   --   --   ALBUMIN 2.7* 2.1*  --  1.9*  --   --   --   --   AST 15 10*  --  11*  --   --   --   --   ALT 14 12  --  11  --   --   --   --   ALKPHOS 197* 142*  --  120  --   --   --   --   BILITOT 0.6 0.5  --  0.7  --   --   --   --    < > = values in this interval not displayed.    Studies Reviewed:  CT Soft Tissue Neck W Contrast Result Date: 09/26/2023 CLINICAL DATA:  Shortness of breath EXAM: CT NECK WITH CONTRAST TECHNIQUE: Multidetector CT imaging of the neck was performed using the standard protocol following the bolus administration of intravenous contrast. RADIATION DOSE REDUCTION: This exam was performed according to the departmental dose-optimization program which includes automated exposure control, adjustment of  the mA and/or kV according to patient size and/or use of iterative reconstruction technique. CONTRAST:  75mL OMNIPAQUE  IOHEXOL  300 MG/ML  SOLN COMPARISON:  06/15/2023 FINDINGS: Pharynx and larynx: Irregular masslike thickening involving the upper cervical esophagus and infraglottic airway with tracheostomy tube. Patchy gas bubbles encompass the tracheostomy tube in the airway at the thoracic inlet. This frothy material may reflect necrotic tumor, infection, or prior bleeding - no active hemorrhage is seen. Patient is certainly at risk for esophagotracheal fistula. The degree of tracheal narrowing is improved. Submucosal  low-density thickening involving the supraglottic airway, symmetric and presumably related to radiotherapy. Salivary glands: No acute finding Thyroid : Patchy low-density in the right thyroid  since prior, direct invasion of the left para median thyroid  suspected on comparison study. The cystic spaces could reflect progressive tumor with necrosis or infectious collections, they diffusely involve the right lobe. Lymph nodes: Multiple heterogeneous enhancing cervical lymph nodes in the left jugular chain which are more prominent than before, up to 14 mm in the left lower neck on 6:91 Vascular: Extrinsic narrowing of the lower left IJ. Porta catheter on the right in unremarkable position. No pseudoaneurysm seen. Limited intracranial: No acute finding Visualized orbits: No acute finding Mastoids and visualized paranasal sinuses: Clear Skeleton: Generalized cervical spine degeneration. Upper chest: Clear apical lungs IMPRESSION: Extensive debris like appearance encompassing the tracheostomy at the upper airway which is extensively affected by tumor. This could reflect infectious debris, blood clot, and or necrotic tumor. Patient is certainly at risk for tracheoesophageal fistula given the extent of tumor. Evidence of direct left thyroid  invasion by tumor on prior study. Progressive low-density areas in the  right lobe thyroid  which could be infiltrating tumor or infectious collections. Submucosal thickening of the supraglottic airway, symmetric and presumably treatment related. Malignant adenopathy in the left jugular chain. Electronically Signed   By: Ronnette Coke M.D.   On: 09/26/2023 09:18   CT CHEST ABDOMEN PELVIS WO CONTRAST Result Date: 09/25/2023 CLINICAL DATA:  Sepsis cancer EXAM: CT CHEST, ABDOMEN AND PELVIS WITHOUT CONTRAST TECHNIQUE: Multidetector CT imaging of the chest, abdomen and pelvis was performed following the standard protocol without IV contrast. RADIATION DOSE REDUCTION: This exam was performed according to the departmental dose-optimization program which includes automated exposure control, adjustment of the mA and/or kV according to patient size and/or use of iterative reconstruction technique. COMPARISON:  CT 08/03/2023, 07/15/2023, PET CT 04/14/2023 FINDINGS: CT CHEST FINDINGS Cardiovascular: Limited evaluation without intravenous contrast. Right-sided central venous port tip at the cavoatrial region. No significant pericardial effusion. Nonaneurysmal aorta Mediastinum/Nodes: Interim tracheostomy tube. Abnormal gas and debris surrounding the tracheostomy tube, series 2, image 4 through 10. Tip of the tube terminates several cm above the carina. Tracheal deviation to the right from known mass lesion. Enlarged right paratracheal node measures 14 mm compared with 10 mm previously. Interval small volume fluid in the anterior mediastinum and generalized hazy appearance of the mediastinal fat compared to prior. Circumferential soft tissue mass/thickening at the included thoracic inlet surrounds the trachea. Mass appears slightly smaller compared with 07/15/2023 chest CT. Hazy density surrounding the distal trachea to the bifurcation and extends to the subcarinal region. Lungs/Pleura: Dense bilateral lower lobe consolidations with air bronchograms. Adjacent heterogeneous consolidations and  ground-glass densities. No pleural effusion. Musculoskeletal: No acute osseous abnormality CT ABDOMEN PELVIS FINDINGS Hepatobiliary: No focal liver abnormality is seen. No gallstones, gallbladder wall thickening, or biliary dilatation. Pancreas: Unremarkable. No pancreatic ductal dilatation or surrounding inflammatory changes. Spleen: Normal in size without focal abnormality. Adrenals/Urinary Tract: Adrenal glands are stable in appearance. Bilateral renal cysts for which no imaging follow-up is recommended. Punctate nonobstructing left kidney stone. No hydronephrosis. Nonspecific left greater than right perinephric stranding. The bladder is unremarkable Stomach/Bowel: The stomach contains a gastrostomy tube in the distal body. There is no dilated small bowel. No acute bowel wall thickening. Negative appendix. Vascular/Lymphatic: Aortic atherosclerosis. No enlarged abdominal or pelvic lymph nodes. Reproductive: Slightly enlarged prostate Other: Negative for pelvic effusion or free air. Small fat containing umbilical hernia. Musculoskeletal: Degenerative changes. No acute osseous  abnormality. IMPRESSION: 1. Interim tracheostomy tube with abnormal appearance of gas and debris surrounding the tracheostomy tube. Findings could be secondary to focal perforation versus infection. Contrast-enhanced CT of the neck/upper chest could be obtained if patient able to tolerate. 2. Interval small volume fluid and stranding in the anterior mediastinum and generalized hazy appearance of the mediastinal fat compared to prior, which may be inflammatory or infectious in etiology. New dense bilateral lower lobe consolidations with air bronchograms, suspect for multifocal pneumonia and or aspiration. 3. Circumferential soft tissue mass/thickening at the included thoracic inlet surrounds the trachea. Left paratracheal mass appears slightly smaller compared with 07/15/2023 chest CT, but there is slight interval enlargement of the right  paratracheal node. Hazy density surrounding the distal trachea to the bifurcation and extends to the subcarinal region, difficult to assess without contrast, possible edema, infection, or additional soft tissue mass. 4. No CT evidence for acute intra-abdominal or pelvic abnormality. Punctate nonobstructing left kidney stone. Nonspecific left greater than right perinephric stranding, correlate with urinalysis. 5. Aortic atherosclerosis. Aortic Atherosclerosis (ICD10-I70.0). Electronically Signed   By: Esmeralda Hedge M.D.   On: 09/25/2023 20:25   DG Chest Port 1 View Result Date: 09/25/2023 CLINICAL DATA:  Questionable sepsis. EXAM: PORTABLE CHEST 1 VIEW COMPARISON:  Chest radiograph dated 07/29/2023. FINDINGS: Right-sided Port-A-Cath with tip at the cavoatrial junction. Shallow inspiration with bibasilar atelectasis. No pleural effusion pneumothorax. The cardiac silhouette is within limits. Tracheostomy approximately 5 cm above the carina. No acute osseous pathology. IMPRESSION: Shallow inspiration with bibasilar atelectasis. Electronically Signed   By: Angus Bark M.D.   On: 09/25/2023 15:34

## 2023-10-04 DIAGNOSIS — E440 Moderate protein-calorie malnutrition: Secondary | ICD-10-CM

## 2023-10-04 DIAGNOSIS — K219 Gastro-esophageal reflux disease without esophagitis: Secondary | ICD-10-CM

## 2023-10-04 DIAGNOSIS — J189 Pneumonia, unspecified organism: Secondary | ICD-10-CM | POA: Diagnosis not present

## 2023-10-04 DIAGNOSIS — C8338 Diffuse large B-cell lymphoma, lymph nodes of multiple sites: Secondary | ICD-10-CM | POA: Diagnosis not present

## 2023-10-04 DIAGNOSIS — I10 Essential (primary) hypertension: Secondary | ICD-10-CM | POA: Diagnosis not present

## 2023-10-04 DIAGNOSIS — L8990 Pressure ulcer of unspecified site, unspecified stage: Secondary | ICD-10-CM | POA: Insufficient documentation

## 2023-10-04 DIAGNOSIS — D638 Anemia in other chronic diseases classified elsewhere: Secondary | ICD-10-CM | POA: Diagnosis not present

## 2023-10-04 LAB — CBC
HCT: 23.1 % — ABNORMAL LOW (ref 39.0–52.0)
Hemoglobin: 6.9 g/dL — CL (ref 13.0–17.0)
MCH: 27 pg (ref 26.0–34.0)
MCHC: 29.9 g/dL — ABNORMAL LOW (ref 30.0–36.0)
MCV: 90.2 fL (ref 80.0–100.0)
Platelets: 722 10*3/uL — ABNORMAL HIGH (ref 150–400)
RBC: 2.56 MIL/uL — ABNORMAL LOW (ref 4.22–5.81)
RDW: 18.6 % — ABNORMAL HIGH (ref 11.5–15.5)
WBC: 8 10*3/uL (ref 4.0–10.5)
nRBC: 0 % (ref 0.0–0.2)

## 2023-10-04 LAB — GLUCOSE, CAPILLARY
Glucose-Capillary: 150 mg/dL — ABNORMAL HIGH (ref 70–99)
Glucose-Capillary: 157 mg/dL — ABNORMAL HIGH (ref 70–99)
Glucose-Capillary: 163 mg/dL — ABNORMAL HIGH (ref 70–99)
Glucose-Capillary: 177 mg/dL — ABNORMAL HIGH (ref 70–99)
Glucose-Capillary: 179 mg/dL — ABNORMAL HIGH (ref 70–99)
Glucose-Capillary: 183 mg/dL — ABNORMAL HIGH (ref 70–99)

## 2023-10-04 LAB — PREPARE RBC (CROSSMATCH)

## 2023-10-04 LAB — HEMOGLOBIN AND HEMATOCRIT, BLOOD
HCT: 26.4 % — ABNORMAL LOW (ref 39.0–52.0)
Hemoglobin: 8.1 g/dL — ABNORMAL LOW (ref 13.0–17.0)

## 2023-10-04 MED ORDER — SODIUM CHLORIDE 0.9% IV SOLUTION: Freq: Once | INTRAVENOUS | Status: DC

## 2023-10-04 NOTE — Plan of Care (Signed)
  Problem: Education: Goal: Knowledge of General Education information will improve Description: Including pain rating scale, medication(s)/side effects and non-pharmacologic comfort measures Outcome: Progressing   Problem: Health Behavior/Discharge Planning: Goal: Ability to manage health-related needs will improve Outcome: Progressing   Problem: Nutrition: Goal: Adequate nutrition will be maintained Outcome: Progressing   Problem: Elimination: Goal: Will not experience complications related to urinary retention Outcome: Progressing   Problem: Pain Managment: Goal: General experience of comfort will improve and/or be controlled Outcome: Progressing   Problem: Tissue Perfusion: Goal: Adequacy of tissue perfusion will improve Outcome: Progressing

## 2023-10-04 NOTE — Progress Notes (Signed)
 Nutrition Follow-up  DOCUMENTATION CODES:   Non-severe (moderate) malnutrition in context of chronic illness  INTERVENTION:   Continue current tube feeding regimen: - Osmolite 1.5 @ 80 mL/hr (1920 mL/day) - PROSource TF20 60 mL daily  Tube feeding regimen provides 2960 kcal, 140 grams of protein, and 1463 ml of H2O.  - Recommend adding free water  flushes of 150 mL every 4 hours  Total free water  with recommended flushes: 2363 mL  NUTRITION DIAGNOSIS:   Moderate Malnutrition related to chronic illness, cancer and cancer related treatments as evidenced by mild fat depletion, mild muscle depletion, percent weight loss.  Ongoing, being addressed via enteral nutrition  GOAL:   Patient will meet greater than or equal to 90% of their needs  Met via enteral nutrition  MONITOR:   TF tolerance  REASON FOR ASSESSMENT:   Consult Enteral/tube feeding initiation and management  ASSESSMENT:   68 y.o. male with medical history significant of diffuse large B cell lymphoma, anemia of chronic disease, essential hypertension, history of laryngeal cancer, GERD, COPD, diastolic CHF, hyperlipidemia, who is status post tracheostomy.  Currently on chemotherapy being followed by oncology who was at the cancer center today for his chemotherapy which included carboplatin  and Taxol.  He initially was at [facility] where he had the paratracheal mass compressing his airway.  That was following radiation.  He had emergent tracheostomy with thoracic surgery.  He was started on chemo there and then transferred to [facility]. Admitted for bilateral pneumonia.  5/19 - admission 5/20 - bronchoscopy, transferred to [facility]  Per Oncology note, pt with a history of 3 cancers: recently diagnosed laryngeal cancer, diffuse large B-cell lymphoma, and vocal cord squamous cell carcinoma. Palliative Medicine Team following.  Attempted to meet with pt at bedside. Pt meeting with another provider at time of RD visit. Noted tube  feeding infusing at goal rate. Will continue current tube feeding regimen. Weight fairly stable. Suspect outlier weight of 84.3 kg obtained on 09/28/23 is inaccurate. Recommend adding free water  flushes to better meet hydration needs.  Admit weight: 99.8 kg Current weight: 101.6 kg  Current TF: Osmolite 1.5 @ 80 mL/hr, PROSource TF20 60 mL daily  Medications reviewed and include: SSI every 4 hours, Novolog  2 units every 4 hours, semglee  5 units daily, lactobacillus 1 gram TID, IV protonix , miralax  17 grams daily, senna 1 tablet daily  Labs reviewed: hemoglobin 6.9 CBG's: 162-184 x 24 hours  UOP: 1200 mL x 24 hours  Diet Order:   Diet Order             Diet NPO time specified  Diet effective now                   EDUCATION NEEDS:   No education needs have been identified at this time  Skin:  Skin Assessment: Skin Integrity Issues: Stage I: coccyx  Last BM:  10/03/23 small type 7  Height:   Ht Readings from Last 1 Encounters:  09/25/23 6' (1.829 m)    Weight:   Wt Readings from Last 1 Encounters:  10/04/23 101.6 kg    BMI:  Body mass index is 30.38 kg/m.  Estimated Nutritional Needs:   Kcal:  2750-3150  Protein:  120-140g  Fluid:  2.7L/day    Ernestina Headland, MS, RD, LDN Registered Dietitian II Please see AMi[facility] for contact information.

## 2023-10-04 NOTE — Progress Notes (Addendum)
 Acute on chronic anemia - Low hemoglobin 6.9 today morning.  Transfusing 1 unit of blood. No evidence of active bleeding.  Discontinued IV Lovenox  in the setting of low hemoglobin.  Can resume once appropriate. continue SCD and TED hose for DVT prophylaxis

## 2023-10-04 NOTE — Progress Notes (Signed)
 [facility]                                                                               Jesus [name], is a 68 y.o. male, DOB - [birth year], GNF:[id number] Admit date - 09/25/2023    Outpatient Primary MD for the patient is [name], MD  LOS - 9  days    Brief summary   Jesus [name] is an 68 y.o. male past medical history of diffuse large B-cell lymphoma, metastatic laryngeal squamous carcinoma peritracheal mass status post tracheostomy on 08/03/2023 by thoracic surgery at [facility], essential hypertension chronic diastolic dysfunction anemia of chronic disease transferred from [facility] due to respiratory distress upon arrival was found tachycardic tachypneic and requiring 6 L of oxygen had a temperature of 102, with purulent tracheal secretions white count of 12 admitted on the triad service for sepsis secondary to pneumonia transferred to PCCM on 09/26/2023 CT scan of the neck revealed extensive debris encompassing the tracheostomy affected by tumor and necrotic tumor with trach esophageal fistula.  Transfer back to [facility] 10/01/2023 on empiric antibiotics he is to complete a 7-day course of IV Unasyn  followed by 14 days of Augmentin  for suppressive therapy.    Assessment & Plan    Assessment and Plan:    Sepsis secondary to Multifocal pneumonia/acute respiratory failure with hypoxia secondary to malignancy: He completed 7-day course of IV Unasyn , now will continue 14-day course of Augmentin  for suppressive therapy. PCCM and ENT, relate infection may lead to destructive airway compromise and tumor will continue to progress without chemotherapy which she is not a candidate to receive due to his infection. 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 Follow-up with oncology as an outpatient. Oncology evaluated the patient on this admission and recommended moving towards  hospice care. Palliative care met with family and patient, they decided to continue aggressive treatment, he is a full code. He wanted to go to skilled nursing facility or LTAC.   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HCC)/with paratracheal mass status post tracheostomy on 08/03/2023 at [facility] / metastatic glandular squamous cell carcinoma: Oncology was consulted he was previously seen by Dr. Elvan Hamel and Dr. Teofilo Fellers who did not feel intervention were possible he was seen at [facility] and was started on carboplatin  and Taxol. ENT was involved in the care and there is no good surgical repair option nor good options to replace the current tracheostomy tube without risking airway loss though he is not a candidate for surgical intervention. Not  a candidate for treatment at this time.  Palliative care was consulted and family and patient wants to continue aggressive care. Physicians have explained to the patient the severity of the case and they have unrealistic expectations. the patient understands the situation.   Anemia of chronic disease: Hemoglobin today is 6.9. transfuse 1 unit of prbc transfusion. Repeat H& H is 8.1 Likely multifactorial secondary to malignancy and poor nutrition. Transfuse to keep hemoglobin greater than 7.     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   Thrombocytosis: Likely reactive.  They are improving slowly.  Essential hypertension/hyperlipidemia/chronic diastolic dysfunction: Continue home meds.    G-tube inserted on 06/21/2023. Noted.     RN Pressure Injury Documentation: Pressure Injury 09/25/23 Coccyx Stage 1 -  Intact skin with non-blanchable redness of a localized area usually over a bony prominence. in gluteal cleft and bottom of sacrum/coccyx (Active)  09/25/23 2150  Location: Coccyx  Location Orientation:   Staging: Stage 1 -  Intact skin with non-blanchable redness of a localized area usually over a bony prominence.  Wound  Description (Comments): in gluteal cleft and bottom of sacrum/coccyx  Present on Admission: Yes  Dressing Type Foam - Lift dressing to assess site every shift 10/03/23 1014   Local wound care.  Malnutrition Type:  Nutrition Problem: Moderate Malnutrition Etiology: chronic illness, cancer and cancer related treatments   Malnutrition Characteristics:  Signs/Symptoms: mild fat depletion, mild muscle depletion, percent weight loss   Nutrition Interventions:  Interventions: Tube feeding, Prostat  Estimated body mass index is 30.38 kg/m as calculated from the following:   Height as of this encounter: 6' (1.829 m).   Weight as of this encounter: 101.6 kg.  Code Status: full code.  DVT Prophylaxis:  Place and maintain sequential compression device Start: 10/04/23 0459 Place TED hose Start: 10/04/23 0459   Level of Care: Level of care: Med-Surg Family Communication: Updated patient's family at bedside.   Disposition Plan:     Remains inpatient appropriate:  placement.   Procedures:  None.   Consultants:   Palliative care.   Antimicrobials:   Anti-infectives (From admission, onward)    Start     Dose/Rate Route Frequency Ordered Stop   10/17/23 1000  amoxicillin -clavulanate (AUGMENTIN ) 500-125 MG per tablet 1 tablet       Placed in "Followed by" Linked Group   1 tablet Oral 2 times daily 09/29/23 1152     10/03/23 1000  amoxicillin -clavulanate (AUGMENTIN ) 875-125 MG per tablet 1 tablet       Placed in "Followed by" Linked Group   1 tablet Per Tube Every 12 hours 09/29/23 1152 10/17/23 0959   09/29/23 1200  Ampicillin -Sulbactam (UNASYN ) 3 g in sodium chloride  0.9 % 100 mL IVPB        3 g 200 mL/hr over 30 Minutes Intravenous Every 6 hours 09/29/23 1100 10/02/23 1824   09/28/23 1800  Vancomycin  (VANCOCIN ) 1,250 mg in sodium chloride  0.9 % 250 mL IVPB  Status:  Discontinued        1,250 mg 166.7 mL/hr over 90 Minutes Intravenous Every 12 hours 09/28/23 1009 09/29/23 1058    09/27/23 0600  vancomycin  (VANCOREADY) IVPB 1500 mg/300 mL  Status:  Discontinued        1,500 mg 150 mL/hr over 120 Minutes Intravenous Every 12 hours 09/26/23 1747 09/28/23 1009   09/26/23 1845  vancomycin  (VANCOREADY) IVPB 2000 mg/400 mL        2,000 mg 200 mL/hr over 120 Minutes Intravenous  Once 09/26/23 1747 09/26/23 2053   09/26/23 1800  piperacillin -tazobactam (ZOSYN ) IVPB 3.375 g  Status:  Discontinued        3.375 g 12.5 mL/hr over 240 Minutes Intravenous Every 8 hours 09/26/23 1747 09/29/23 1058   09/26/23 0000  cefTRIAXone  (ROCEPHIN ) 2 g in sodium chloride  0.9 % 100 mL IVPB  Status:  Discontinued        2 g 200 mL/hr over 30 Minutes Intravenous Every 24 hours 09/25/23 2327 09/26/23 1726   09/26/23 0000  azithromycin  (ZITHROMAX ) 500 mg in sodium chloride  0.9 % 250 mL  IVPB  Status:  Discontinued        500 mg 250 mL/hr over 60 Minutes Intravenous Every 24 hours 09/25/23 2327 09/26/23 1726   09/25/23 1415  vancomycin  (VANCOREADY) IVPB 1750 mg/350 mL        1,750 mg 175 mL/hr over 120 Minutes Intravenous  Once 09/25/23 1402 09/25/23 1854   09/25/23 1400  ceFEPIme  (MAXIPIME ) 2 g in sodium chloride  0.9 % 100 mL IVPB        2 g 200 mL/hr over 30 Minutes Intravenous  Once 09/25/23 1349 09/25/23 1459   09/25/23 1400  metroNIDAZOLE  (FLAGYL ) IVPB 500 mg        500 mg 100 mL/hr over 60 Minutes Intravenous  Once 09/25/23 1349 09/25/23 1854   09/25/23 1400  vancomycin  (VANCOCIN ) IVPB 1000 mg/200 mL premix  Status:  Discontinued        1,000 mg 200 mL/hr over 60 Minutes Intravenous  Once 09/25/23 1349 09/25/23 1357        Medications  Scheduled Meds:  sodium chloride    Intravenous Once   acetaminophen  (TYLENOL ) oral liquid 160 mg/5 mL  650 mg Per Tube Q6H   amoxicillin -clavulanate  1 tablet Per Tube Q12H   Followed by   Cecily Cohen ON 10/17/2023] amoxicillin -clavulanate  1 tablet Oral BID   carbamide peroxide  5 drop Both EARS BID   Chlorhexidine  Gluconate Cloth  6 each Topical  Daily   feeding supplement (PROSource TF20)  60 mL Per Tube Daily   fentaNYL   1 patch Transdermal Q48H   gabapentin   300 mg Per Tube Q12H   insulin  aspart  0-9 Units Subcutaneous Q4H   insulin  aspart  2 Units Subcutaneous Q4H   insulin  glargine-yfgn  5 Units Subcutaneous Daily   lactobacillus  1 g Per Tube TID WC   naproxen   500 mg Per Tube BID WC   mouth rinse  15 mL Mouth Rinse 4 times per day   pantoprazole  (PROTONIX ) IV  40 mg Intravenous Daily   polyethylene glycol  17 g Per Tube Daily   senna  1 tablet Per Tube Daily   sodium chloride  flush  10-40 mL Intracatheter Q12H   Continuous Infusions:  feeding supplement (OSMOLITE 1.5 CAL) 1,000 mL (10/04/23 0545)   PRN Meds:.morphine  injection, [DISCONTINUED] ondansetron  **OR** ondansetron  (ZOFRAN ) IV, mouth rinse, oxyCODONE , sodium chloride  flush    Subjective:   Jeanpierre Thebeau was seen and examined today.  No new complaints.    Objective:   Vitals:   10/04/23 0015 10/04/23 0354 10/04/23 0444 10/04/23 0748  BP: 116/72  113/67   Pulse: 90 85 90 80  Resp: 18 17 18 16   Temp: 98 F (36.7 C)  98.1 F (36.7 C)   TempSrc: Oral  Oral   SpO2: 100% 98% 100% 97%  Weight:   101.6 kg   Height:        Intake/Output Summary (Last 24 hours) at 10/04/2023 0849 Last data filed at 10/04/2023 0300 Gross per 24 hour  Intake --  Output 1200 ml  Net -1200 ml   Filed Weights   10/02/23 0545 10/03/23 0353 10/04/23 0444  Weight: 103.5 kg 102.5 kg 101.6 kg     Exam General exam: Appears calm and comfortable  Respiratory system:  s/p trach, air entry fair. No wheezing heard. Cardiovascular system: S1 & S2 heard, RRR. No JVD,  Gastrointestinal system: Abdomen is nondistended, soft and nontender. S/p PEG.  Central nervous system: Alert and oriented.  Extremities: warm extremities.  Skin: No  rashes,  Psychiatry:  mood is appropriate.    Data Reviewed:  I have personally reviewed following labs and imaging studies   CBC Lab Results   Component Value Date   WBC 8.0 10/04/2023   RBC 2.56 (L) 10/04/2023   HGB 6.9 (LL) 10/04/2023   HCT 23.1 (L) 10/04/2023   MCV 90.2 10/04/2023   MCH 27.0 10/04/2023   PLT 722 (H) 10/04/2023   MCHC 29.9 (L) 10/04/2023   RDW 18.6 (H) 10/04/2023   LYMPHSABS 1.5 09/28/2023   MONOABS 0.9 09/28/2023   EOSABS 0.0 09/28/2023   BASOSABS 0.0 09/28/2023     Last metabolic panel Lab Results  Component Value Date   NA 137 10/02/2023   K 4.8 10/02/2023   CL 98 10/02/2023   CO2 31 10/02/2023   BUN 14 10/02/2023   CREATININE 0.88 10/02/2023   GLUCOSE 162 (H) 10/02/2023   GFRNONAA >60 10/02/2023   GFRAA >60 11/28/2019   CALCIUM  8.5 (L) 10/02/2023   PHOS 4.1 09/27/2023   PROT 6.5 09/27/2023   ALBUMIN 1.9 (L) 09/27/2023   LABGLOB 2.3 08/21/2019   AGRATIO 2.0 08/21/2019   BILITOT 0.7 09/27/2023   ALKPHOS 120 09/27/2023   AST 11 (L) 09/27/2023   ALT 11 09/27/2023   ANIONGAP 8 10/02/2023    CBG (last 3)  Recent Labs    10/03/23 2002 10/04/23 0011 10/04/23 0447  GLUCAP 184* 179* 177*      Coagulation Profile: No results for input(s): "INR", "PROTIME" in the last 168 hours.   Radiology Studies: No results found.     Feliciana Horn M.D. [facility] 10/04/2023, 8:49 AM  Available via Epic secure chat 7am-7pm After 7 pm, please refer to night coverage provider listed on amion.

## 2023-10-04 NOTE — Care Management Important Message (Signed)
 Important Message  Patient Details  Name: Jesus [name] MRN: [id number] Date of Birth: March 09, 1956   Important Message Given:  Yes - Medicare IM     Wynonia Hedges 10/04/2023, 4:58 PM

## 2023-10-04 NOTE — Progress Notes (Signed)
 PT Cancellation Note  Patient Details Name: RODERICK CALO MRN: [id number] DOB: [birth year]   Cancelled Treatment:    Reason Eval/Treat Not Completed: (P) Other (comment) (RN defer until later in the day, pt in meeting with chaplain and family and pending blood transfusion.) Will continue efforts per PT plan of care as schedule permits.   Mariel Shope Jamaya Sleeth 10/04/2023, 9:41 AM

## 2023-10-04 NOTE — Progress Notes (Signed)
 This chaplain is present with the Pt. and Pt. daughter-[name] for Advance Directive education and notarizing of the Pt. AD:  HCPOA and Living Will. The Pt. answered the chaplain's clarifying questions after AD education before calling the notary.  The chaplain is present with the Pt., [name], notary and witnesses for the notarizing of the Pt. AD: HCPOA and LW.  The Pt. named Jesus [name] as his healthcare agent. If this person is unable or unwilling to serve in this role the Pt. next choice is [employer] [employer].  The chaplain gave the Pt. the original AD along with two copies. The chaplain scanned the Pt. AD into the Pt. EMR.  The chaplain is available for F/U spiritual care as needed.  Chaplain Kathleene Papas [phone]

## 2023-10-05 ENCOUNTER — Inpatient Hospital Stay (HOSPITAL_COMMUNITY)

## 2023-10-05 DIAGNOSIS — D638 Anemia in other chronic diseases classified elsewhere: Secondary | ICD-10-CM | POA: Diagnosis not present

## 2023-10-05 DIAGNOSIS — I10 Essential (primary) hypertension: Secondary | ICD-10-CM | POA: Diagnosis not present

## 2023-10-05 DIAGNOSIS — C8338 Diffuse large B-cell lymphoma, lymph nodes of multiple sites: Secondary | ICD-10-CM | POA: Diagnosis not present

## 2023-10-05 DIAGNOSIS — J189 Pneumonia, unspecified organism: Secondary | ICD-10-CM | POA: Diagnosis not present

## 2023-10-05 LAB — GLUCOSE, CAPILLARY
Glucose-Capillary: 135 mg/dL — ABNORMAL HIGH (ref 70–99)
Glucose-Capillary: 147 mg/dL — ABNORMAL HIGH (ref 70–99)
Glucose-Capillary: 163 mg/dL — ABNORMAL HIGH (ref 70–99)
Glucose-Capillary: 168 mg/dL — ABNORMAL HIGH (ref 70–99)
Glucose-Capillary: 170 mg/dL — ABNORMAL HIGH (ref 70–99)
Glucose-Capillary: 183 mg/dL — ABNORMAL HIGH (ref 70–99)

## 2023-10-05 LAB — CBC WITH DIFFERENTIAL/PLATELET
Abs Immature Granulocytes: 0.15 10*3/uL — ABNORMAL HIGH (ref 0.00–0.07)
Basophils Absolute: 0 10*3/uL (ref 0.0–0.1)
Basophils Relative: 0 %
Eosinophils Absolute: 0.1 10*3/uL (ref 0.0–0.5)
Eosinophils Relative: 1 %
HCT: 26.9 % — ABNORMAL LOW (ref 39.0–52.0)
Hemoglobin: 8.3 g/dL — ABNORMAL LOW (ref 13.0–17.0)
Immature Granulocytes: 2 %
Lymphocytes Relative: 23 %
Lymphs Abs: 1.9 10*3/uL (ref 0.7–4.0)
MCH: 27.7 pg (ref 26.0–34.0)
MCHC: 30.9 g/dL (ref 30.0–36.0)
MCV: 89.7 fL (ref 80.0–100.0)
Monocytes Absolute: 0.7 10*3/uL (ref 0.1–1.0)
Monocytes Relative: 9 %
Neutro Abs: 5.5 10*3/uL (ref 1.7–7.7)
Neutrophils Relative %: 65 %
Platelets: 754 10*3/uL — ABNORMAL HIGH (ref 150–400)
RBC: 3 MIL/uL — ABNORMAL LOW (ref 4.22–5.81)
RDW: 18.5 % — ABNORMAL HIGH (ref 11.5–15.5)
WBC: 8.5 10*3/uL (ref 4.0–10.5)
nRBC: 0 % (ref 0.0–0.2)

## 2023-10-05 LAB — TYPE AND SCREEN
ABO/RH(D): A POS
Antibody Screen: NEGATIVE
Unit division: 0

## 2023-10-05 LAB — BPAM RBC
Blood Product Expiration Date: 202506232359
ISSUE DATE / TIME: 202505280954
Unit Type and Rh: 6200

## 2023-10-05 MED ORDER — DM-GUAIFENESIN ER 30-600 MG PO TB12: 1.0000 | ORAL_TABLET | Freq: Two times a day (BID) | ORAL | Status: DC

## 2023-10-05 MED ORDER — GUAIFENESIN-DM 100-10 MG/5ML PO SYRP
10.0000 mL | ORAL_SOLUTION | Freq: Four times a day (QID) | ORAL | Status: DC
  Administered 2023-10-05 – 2023-10-13 (×32): 10 mL
  Filled 2023-10-05 (×31): qty 10

## 2023-10-05 MED ORDER — GUAIFENESIN-DM 100-10 MG/5ML PO SYRP
10.0000 mL | ORAL_SOLUTION | Freq: Four times a day (QID) | ORAL | Status: DC
  Filled 2023-10-05: qty 10

## 2023-10-05 NOTE — Plan of Care (Signed)
  Problem: Education: Goal: Knowledge of General Education information will improve Description: Including pain rating scale, medication(s)/side effects and non-pharmacologic comfort measures Outcome: Progressing   Problem: Health Behavior/Discharge Planning: Goal: Ability to manage health-related needs will improve Outcome: Progressing   Problem: Clinical Measurements: Goal: Ability to maintain clinical measurements within normal limits will improve Outcome: Progressing Goal: Will remain free from infection Outcome: Progressing Goal: Diagnostic test results will improve Outcome: Progressing Goal: Respiratory complications will improve Outcome: Progressing Goal: Cardiovascular complication will be avoided Outcome: Progressing   Problem: Activity: Goal: Risk for activity intolerance will decrease Outcome: Progressing   Problem: Nutrition: Goal: Adequate nutrition will be maintained Outcome: Progressing   Problem: Coping: Goal: Level of anxiety will decrease Outcome: Progressing   Problem: Elimination: Goal: Will not experience complications related to bowel motility Outcome: Progressing Goal: Will not experience complications related to urinary retention Outcome: Progressing   Problem: Pain Managment: Goal: General experience of comfort will improve and/or be controlled Outcome: Progressing   Problem: Safety: Goal: Ability to remain free from injury will improve Outcome: Progressing   Problem: Skin Integrity: Goal: Risk for impaired skin integrity will decrease Outcome: Progressing   Problem: Education: Goal: Ability to describe self-care measures that may prevent or decrease complications (Diabetes Survival Skills Education) will improve Outcome: Progressing   Problem: Coping: Goal: Ability to adjust to condition or change in health will improve Outcome: Progressing   Problem: Fluid Volume: Goal: Ability to maintain a balanced intake and output will  improve Outcome: Progressing   Problem: Health Behavior/Discharge Planning: Goal: Ability to identify and utilize available resources and services will improve Outcome: Progressing Goal: Ability to manage health-related needs will improve Outcome: Progressing   Problem: Metabolic: Goal: Ability to maintain appropriate glucose levels will improve Outcome: Progressing   Problem: Nutritional: Goal: Maintenance of adequate nutrition will improve Outcome: Progressing Goal: Progress toward achieving an optimal weight will improve Outcome: Progressing   Problem: Skin Integrity: Goal: Risk for impaired skin integrity will decrease Outcome: Progressing   Problem: Tissue Perfusion: Goal: Adequacy of tissue perfusion will improve Outcome: Progressing   Problem: Fluid Volume: Goal: Hemodynamic stability will improve Outcome: Progressing   Problem: Clinical Measurements: Goal: Diagnostic test results will improve Outcome: Progressing Goal: Signs and symptoms of infection will decrease Outcome: Progressing   Problem: Respiratory: Goal: Ability to maintain adequate ventilation will improve Outcome: Progressing   Problem: Education: Goal: Knowledge about tracheostomy care/management will improve Outcome: Progressing   Problem: Activity: Goal: Ability to tolerate increased activity will improve Outcome: Progressing   Problem: Health Behavior/Discharge Planning: Goal: Ability to manage tracheostomy will improve Outcome: Progressing   Problem: Respiratory: Goal: Patent airway maintenance will improve Outcome: Progressing   Problem: Role Relationship: Goal: Ability to communicate will improve Outcome: Progressing

## 2023-10-05 NOTE — TOC Progression Note (Addendum)
 Transition of Care [facility]) - Progression Note    Patient Details  Name: Jesus [name] MRN: [id number] Date of Birth: [birth year]  Transition of [facility] CM/SW Contact  Dane Dung, RN Phone Number: 10/05/2023, 11:54 AM  Clinical Narrative:    CM spoke with Dr. Nichole Barker and she states that appeal was completed by phone with Medical director with [employer] Insurance provider and patient was declined for LTAC placement.  Rosita Cools, CM with [facility] plans to appeal for LTAC on the patient's family's behalf.  [place], MSw is aware and will continue to follow for placement as well.    Expected Discharge Plan: Skilled Nursing Facility Barriers to Discharge: Continued Medical Work up  Expected Discharge Plan and Services In-house Referral: Clinical Social Work   Post Acute Care Choice: Skilled Nursing Facility Living arrangements for the past 2 months: Single Family Home, Skilled Nursing Facility                                       Social Determinants of Health (SDOH) Interventions SDOH Screenings   Food Insecurity: Food Insecurity Present (09/25/2023)  Housing: High Risk (09/25/2023)  Transportation Needs: No Transportation Needs (09/25/2023)  Recent Concern: Transportation Needs - Unmet Transportation Needs (07/29/2023)  Utilities: Not At Risk (09/25/2023)  Depression (PHQ2-9): Low Risk  (11/09/2022)  Financial Resource Strain: Low Risk  (10/06/2020)  Social Connections: Moderately Isolated (09/25/2023)  Tobacco Use: Medium Risk (09/25/2023)    Readmission Risk Interventions    07/31/2023    4:28 PM 07/17/2023    3:08 PM 07/05/2023   11:49 AM  Readmission Risk Prevention Plan  Transportation Screening Complete Complete Complete  Medication Review Oceanographer) Complete Complete Complete  PCP or Specialist appointment within 3-5 days of discharge Complete Complete Complete  HRI or Home Care Consult Complete Complete Complete  SW Recovery  Care/Counseling Consult Complete Complete Complete  Palliative Care Screening Not Applicable Not Applicable Not Applicable  Skilled Nursing Facility Not Applicable Not Applicable Not Applicable

## 2023-10-05 NOTE — Progress Notes (Signed)
 Physical Therapy Treatment Patient Details Name: Jesus [name] MRN: [id number] DOB: 18-Apr-1956 Today's Date: 10/05/2023   History of Present Illness Pt is 68 year old presented to [facility] from the cancer center on  09/25/23 for hypoxia and tachycardia. Pt with sepsis due to PNA. 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 Pt transferred to [facility] on 09/26/23. Pt from [facility] where he has been undergoing rehab after hospitalization and emergent trach at [facility] on 08/03/23. PMH - trach, laryngeal CA, Gtube, lymphoma, HTN, copd, chf    PT Comments  Pt received in supine, agreeable to therapy session and with good participation and tolerance for seated balance during self-care tasks, transfer and gait training. Pt needing chair follow for safety during gait trial due to partial BLE buckling as he fatigues, HR to 107 bpm with exertion and SpO2 93% and greater on 35% FiO2 with exertion. Pt returned to wall O2 via trach mask (charted as 6L/min FiO2 28% per flowsheet but appears to be next level up FiO2). Pt continuing to report very tender/sensitive skin below and surrounding trach collar. Pt making progress toward goals, with good seated tolerance and improving standing tolerance but still needing +2 due to lines/mild instability for dynamic standing tasks.   If plan is discharge home, recommend the following: A lot of help with bathing/dressing/bathroom;A lot of help with walking and/or transfers;Assistance with cooking/housework;Assist for transportation;Help with stairs or ramp for entrance   Can travel by private vehicle        Equipment Recommendations  None recommended by PT    Recommendations for Other Services       Precautions / Restrictions Precautions Precautions: Fall;Other (comment) Recall of Precautions/Restrictions: Intact Precaution/Restrictions Comments: trach, gtube (trach is very sensitive be careful when moving it or placing him on O2) Restrictions Weight Bearing Restrictions Per  Provider Order: No     Mobility  Bed Mobility Overal bed mobility: Needs Assistance Bed Mobility: Rolling, Sidelying to Sit Rolling: Supervision Sidelying to sit: Contact guard assist, HOB elevated, Used rails       General bed mobility comments: Pt requesting to stay seated EOB at end of session, RN notified, bed alarm set for pt safety.    Transfers Overall transfer level: Needs assistance Equipment used: Rolling walker (2 wheels), None Transfers: Sit to/from Stand, Bed to chair/wheelchair/BSC Sit to Stand: Min assist           General transfer comment: CGA with RW, minA when unsupported.    Ambulation/Gait Ambulation/Gait assistance: Min assist, +2 safety/equipment Gait Distance (Feet): 50 Feet Assistive device: Rolling walker (2 wheels) Gait Pattern/deviations: Step-through pattern, Narrow base of support, Decreased stride length, Drifts right/left, Knees buckling Gait velocity: decreased     General Gait Details: Cues for upright trunk and closer RW proximity, pt with a couple episodes of partial knee buckling so had NT bring rolling chair to follow for pt safety, but wtih RW and minA pt able to correct and a few standing breaks needed to achieve distance. Pt also stood ~5 mins prior to gait trial in hallway with CGA and RW support. HR to 107 bpm with exertion.   Stairs             Wheelchair Mobility     Tilt Bed    Modified Rankin (Stroke Patients Only)       Balance Overall balance assessment: Needs assistance Sitting-balance support: No upper extremity supported, Feet supported Sitting balance-Leahy Scale: Good     Standing balance support: Bilateral upper extremity supported,  Reliant on assistive device for balance, During functional activity Standing balance-Leahy Scale: Poor Standing balance comment: reliant on RW and some knee instability/partial buckling as he fatigues                            Communication  Communication Communication: Impaired Factors Affecting Communication: Trach/intubated  Cognition Arousal: Alert Behavior During Therapy: WFL for tasks assessed/performed   PT - Cognitive impairments: No apparent impairments                       PT - Cognition Comments: RN notified pt may want blank sheets of paper, PTA gave him a pen to use to communicate easier. Pt flat initially but smiling toward end of session and performed a "fake" buckle of knees once standing EOB then smiling at PTA. But in hallway, pt minor BLE buckling and reports that was weakness/fatigue not just pretend. Following commands: Intact      Cueing Cueing Techniques: Verbal cues, Gestural cues  Exercises Other Exercises Other Exercises: standing hip flexion x5 reps pre-gait    General Comments General comments (skin integrity, edema, etc.): pt needing suctioning below trach/around upper chest and pt self-suctioning around mouth a few times during session. gown changed due to phlegm and pt able to wash his face and under his arms before gown changed while sitting EOB. SpO2 desat to 90% when transferred to 28% FiO2 trach mask, so pt increaesed to 35% FiO2 via trach mask and improves to 93%-94% during standing/gait task and still WFL once seated EOB and returned to wall O2 via trach mask      Pertinent Vitals/Pain Pain Assessment Pain Assessment: Faces Faces Pain Scale: Hurts little more Pain Location: throat, neck Pain Descriptors / Indicators: Grimacing, Guarding, Tender Pain Intervention(s): Limited activity within patient's tolerance, Monitored during session, Repositioned    Home Living                          Prior Function            PT Goals (current goals can now be found in the care plan section) Acute Rehab PT Goals Patient Stated Goal: not stated PT Goal Formulation: With patient Time For Goal Achievement: 10/11/23 Progress towards PT goals: Progressing toward goals     Frequency    Min 2X/week      PT Plan      Co-evaluation              AM-PAC PT "6 Clicks" Mobility   Outcome Measure  Help needed turning from your back to your side while in a flat bed without using bedrails?: A Little Help needed moving from lying on your back to sitting on the side of a flat bed without using bedrails?: A Little Help needed moving to and from a bed to a chair (including a wheelchair)?: A Little Help needed standing up from a chair using your arms (e.g., wheelchair or bedside chair)?: A Little Help needed to walk in hospital room?: A Lot (+2 safety) Help needed climbing 3-5 steps with a railing? : Total 6 Click Score: 15    End of Session Equipment Utilized During Treatment: Oxygen (chair follow by NT) Activity Tolerance: Patient tolerated treatment well Patient left: in bed;with call bell/phone within reach;with bed alarm set (sitting EOB) Nurse Communication: Mobility status;Other (comment) (SpO2 on trach mask; pt needs new foam changed under  his trach mask due to dampness from secretions; pt asking for blank paper but PTA forgot to bring it to room, RN secure chatted later) PT Visit Diagnosis: Other abnormalities of gait and mobility (R26.89);Muscle weakness (generalized) (M62.81);Pain Pain - part of body:  (neck, throat and upper back)     Time: 4259-5638 PT Time Calculation (min) (ACUTE ONLY): 38 min  Charges:    $Gait Training: 8-22 mins $Therapeutic Activity: 23-37 mins PT General Charges $$ ACUTE PT VISIT: 1 Visit                     Byren Pankow P., PTA Acute Rehabilitation Services Secure Chat Preferred 9a-5:30pm Office: [phone]    Arville Laughter 10/05/2023, 6:10 PM

## 2023-10-05 NOTE — Progress Notes (Signed)
 [facility]                                                                               Jesus [name], is a 68 y.o. male, DOB - 1955/11/02, ZOX:[id number] Admit date - 09/25/2023    Outpatient Primary MD for the patient is [name], MD  LOS - 10  days    Brief summary   Jesus [name] is an 68 y.o. male past medical history of diffuse large B-cell lymphoma, metastatic laryngeal squamous carcinoma peritracheal mass status post tracheostomy on 08/03/2023 by thoracic surgery at [facility], essential hypertension chronic diastolic dysfunction anemia of chronic disease transferred from [facility] due to respiratory distress upon arrival was found tachycardic tachypneic and requiring 6 L of oxygen had a temperature of 102, with purulent tracheal secretions white count of 12 admitted on the triad service for sepsis secondary to pneumonia transferred to PCCM on 09/26/2023 CT scan of the neck revealed extensive debris encompassing the tracheostomy affected by tumor and necrotic tumor with trach esophageal fistula.  Transfer back to [facility] 10/01/2023 on empiric antibiotics he is to complete a 7-day course of IV Unasyn  followed by 14 days of Augmentin  for suppressive therapy.    Assessment & Plan    Assessment and Plan:    Sepsis secondary to Multifocal pneumonia/acute respiratory failure with hypoxia secondary to malignancy: He completed 7-day course of IV Unasyn , now will continue 14-day course of Augmentin  for suppressive therapy. PCCM and ENT, relate infection may lead to destructive airway compromise and tumor will continue to progress without chemotherapy which she is not a candidate to receive due to his infection. 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 Follow-up with oncology as an outpatient. Oncology evaluated the patient on this admission and recommended moving towards  hospice care. Palliative care met with family and patient, they decided to continue aggressive treatment, he is a full code.  Patient with increased secretions, Robitussin added. Will get repeat CXR for further evaluation.  He wanted to go to skilled nursing facility or LTAC.   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HCC)/with paratracheal mass status post tracheostomy on 08/03/2023 at [facility] / metastatic glandular squamous cell carcinoma: Oncology was consulted he was previously seen by Dr. Elvan Hamel and Dr. Teofilo Fellers who did not feel intervention were possible he was seen at [facility] and was started on carboplatin  and Taxol. ENT was involved in the care and there is no good surgical repair option nor good options to replace the current tracheostomy tube without risking airway loss though he is not a candidate for surgical intervention. Not  a candidate for treatment at this time.  Palliative care was consulted and family and patient wants to continue aggressive care. Physicians have explained to the patient the severity of the case and they have unrealistic expectations. the patient understands the situation.   Anemia of chronic disease: Hemoglobin today is 6.9. transfuse 1 unit of prbc transfusion. Repeat H& H is 8.1 and 8.3 and stable.  Likely multifactorial secondary to malignancy and poor nutrition. Transfuse to keep hemoglobin greater  than 7.     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   Thrombocytosis: Likely reactive.  They are improving slowly.   Essential hypertension/hyperlipidemia/chronic diastolic dysfunction: Optimal BP parameters .   G-tube inserted on 06/21/2023. Noted.     RN Pressure Injury Documentation: Pressure Injury 09/25/23 Coccyx Stage 1 -  Intact skin with non-blanchable redness of a localized area usually over a bony prominence. in gluteal cleft and bottom of sacrum/coccyx (Active)  09/25/23 2150  Location: Coccyx  Location Orientation:    Staging: Stage 1 -  Intact skin with non-blanchable redness of a localized area usually over a bony prominence.  Wound Description (Comments): in gluteal cleft and bottom of sacrum/coccyx  Present on Admission: Yes  Dressing Type Foam - Lift dressing to assess site every shift 10/03/23 1014   Local wound care.  Malnutrition Type:  Nutrition Problem: Moderate Malnutrition Etiology: chronic illness, cancer and cancer related treatments   Malnutrition Characteristics:  Signs/Symptoms: mild fat depletion, mild muscle depletion, percent weight loss   Nutrition Interventions:  Interventions: Tube feeding, Prostat  Estimated body mass index is 30.38 kg/m as calculated from the following:   Height as of this encounter: 6' (1.829 m).   Weight as of this encounter: 101.6 kg.  Code Status: full code.  DVT Prophylaxis:  Place and maintain sequential compression device Start: 10/04/23 0459 Place TED hose Start: 10/04/23 0459   Level of Care: Level of care: Med-Surg Family Communication: Updated patient's family at bedside.   Disposition Plan:     Remains inpatient appropriate:  placement.   Procedures:  None.   Consultants:   Palliative care.   Antimicrobials:   Anti-infectives (From admission, onward)    Start     Dose/Rate Route Frequency Ordered Stop   10/17/23 1000  amoxicillin -clavulanate (AUGMENTIN ) 500-125 MG per tablet 1 tablet       Placed in "Followed by" Linked Group   1 tablet Oral 2 times daily 09/29/23 1152     10/03/23 1000  amoxicillin -clavulanate (AUGMENTIN ) 875-125 MG per tablet 1 tablet       Placed in "Followed by" Linked Group   1 tablet Per Tube Every 12 hours 09/29/23 1152 10/17/23 0959   09/29/23 1200  Ampicillin -Sulbactam (UNASYN ) 3 g in sodium chloride  0.9 % 100 mL IVPB        3 g 200 mL/hr over 30 Minutes Intravenous Every 6 hours 09/29/23 1100 10/02/23 1824   09/28/23 1800  Vancomycin  (VANCOCIN ) 1,250 mg in sodium chloride  0.9 % 250 mL IVPB   Status:  Discontinued        1,250 mg 166.7 mL/hr over 90 Minutes Intravenous Every 12 hours 09/28/23 1009 09/29/23 1058   09/27/23 0600  vancomycin  (VANCOREADY) IVPB 1500 mg/300 mL  Status:  Discontinued        1,500 mg 150 mL/hr over 120 Minutes Intravenous Every 12 hours 09/26/23 1747 09/28/23 1009   09/26/23 1845  vancomycin  (VANCOREADY) IVPB 2000 mg/400 mL        2,000 mg 200 mL/hr over 120 Minutes Intravenous  Once 09/26/23 1747 09/26/23 2053   09/26/23 1800  piperacillin -tazobactam (ZOSYN ) IVPB 3.375 g  Status:  Discontinued        3.375 g 12.5 mL/hr over 240 Minutes Intravenous Every 8 hours 09/26/23 1747 09/29/23 1058   09/26/23 0000  cefTRIAXone  (ROCEPHIN ) 2 g in sodium chloride  0.9 % 100 mL IVPB  Status:  Discontinued        2 g 200 mL/hr over 30 Minutes Intravenous Every  24 hours 09/25/23 2327 09/26/23 1726   09/26/23 0000  azithromycin  (ZITHROMAX ) 500 mg in sodium chloride  0.9 % 250 mL IVPB  Status:  Discontinued        500 mg 250 mL/hr over 60 Minutes Intravenous Every 24 hours 09/25/23 2327 09/26/23 1726   09/25/23 1415  vancomycin  (VANCOREADY) IVPB 1750 mg/350 mL        1,750 mg 175 mL/hr over 120 Minutes Intravenous  Once 09/25/23 1402 09/25/23 1854   09/25/23 1400  ceFEPIme  (MAXIPIME ) 2 g in sodium chloride  0.9 % 100 mL IVPB        2 g 200 mL/hr over 30 Minutes Intravenous  Once 09/25/23 1349 09/25/23 1459   09/25/23 1400  metroNIDAZOLE  (FLAGYL ) IVPB 500 mg        500 mg 100 mL/hr over 60 Minutes Intravenous  Once 09/25/23 1349 09/25/23 1854   09/25/23 1400  vancomycin  (VANCOCIN ) IVPB 1000 mg/200 mL premix  Status:  Discontinued        1,000 mg 200 mL/hr over 60 Minutes Intravenous  Once 09/25/23 1349 09/25/23 1357        Medications  Scheduled Meds:  sodium chloride    Intravenous Once   acetaminophen  (TYLENOL ) oral liquid 160 mg/5 mL  650 mg Per Tube Q6H   amoxicillin -clavulanate  1 tablet Per Tube Q12H   Followed by   Cecily Cohen ON 10/17/2023]  amoxicillin -clavulanate  1 tablet Oral BID   Chlorhexidine  Gluconate Cloth  6 each Topical Daily   feeding supplement (PROSource TF20)  60 mL Per Tube Daily   fentaNYL   1 patch Transdermal Q48H   gabapentin   300 mg Per Tube Q12H   guaiFENesin -dextromethorphan   10 mL Per Tube Q6H   insulin  aspart  0-9 Units Subcutaneous Q4H   insulin  aspart  2 Units Subcutaneous Q4H   insulin  glargine-yfgn  5 Units Subcutaneous Daily   lactobacillus  1 g Per Tube TID WC   mouth rinse  15 mL Mouth Rinse 4 times per day   pantoprazole  (PROTONIX ) IV  40 mg Intravenous Daily   polyethylene glycol  17 g Per Tube Daily   senna  1 tablet Per Tube Daily   sodium chloride  flush  10-40 mL Intracatheter Q12H   Continuous Infusions:  feeding supplement (OSMOLITE 1.5 CAL) 1,000 mL (10/04/23 0545)   PRN Meds:.morphine  injection, [DISCONTINUED] ondansetron  **OR** ondansetron  (ZOFRAN ) IV, mouth rinse, oxyCODONE , sodium chloride  flush    Subjective:   Jesus [name] was seen and examined today.  Not in distress this morning.   Objective:   Vitals:   10/05/23 0329 10/05/23 0455 10/05/23 0500 10/05/23 0918  BP:  127/83    Pulse:  90  93  Resp:  18  18  Temp:  98.3 F (36.8 C)    TempSrc:      SpO2: 100% 98%  95%  Weight:   101.6 kg   Height:        Intake/Output Summary (Last 24 hours) at 10/05/2023 1205 Last data filed at 10/05/2023 0539 Gross per 24 hour  Intake --  Output 1150 ml  Net -1150 ml   Filed Weights   10/03/23 0353 10/04/23 0444 10/05/23 0500  Weight: 102.5 kg 101.6 kg 101.6 kg     Exam General exam: Appears calm and comfortable  Respiratory system: s/p trach, diminished at bases. No wheezing heard.  Cardiovascular system: S1 & S2 heard, RRR. No JVD,  Gastrointestinal system: Abdomen is nondistended, soft and nontender.  Central nervous system: Alert and oriented.  Extremities: Symmetric 5 x 5 power. Skin: No rashes,  Psychiatry: Mood & affect appropriate.    Data Reviewed:  I  have personally reviewed following labs and imaging studies   CBC Lab Results  Component Value Date   WBC 8.5 10/05/2023   RBC 3.00 (L) 10/05/2023   HGB 8.3 (L) 10/05/2023   HCT 26.9 (L) 10/05/2023   MCV 89.7 10/05/2023   MCH 27.7 10/05/2023   PLT 754 (H) 10/05/2023   MCHC 30.9 10/05/2023   RDW 18.5 (H) 10/05/2023   LYMPHSABS 1.9 10/05/2023   MONOABS 0.7 10/05/2023   EOSABS 0.1 10/05/2023   BASOSABS 0.0 10/05/2023     Last metabolic panel Lab Results  Component Value Date   NA 137 10/02/2023   K 4.8 10/02/2023   CL 98 10/02/2023   CO2 31 10/02/2023   BUN 14 10/02/2023   CREATININE 0.88 10/02/2023   GLUCOSE 162 (H) 10/02/2023   GFRNONAA >60 10/02/2023   GFRAA >60 11/28/2019   CALCIUM  8.5 (L) 10/02/2023   PHOS 4.1 09/27/2023   PROT 6.5 09/27/2023   ALBUMIN 1.9 (L) 09/27/2023   LABGLOB 2.3 08/21/2019   AGRATIO 2.0 08/21/2019   BILITOT 0.7 09/27/2023   ALKPHOS 120 09/27/2023   AST 11 (L) 09/27/2023   ALT 11 09/27/2023   ANIONGAP 8 10/02/2023    CBG (last 3)  Recent Labs    10/05/23 0004 10/05/23 0427 10/05/23 0834  GLUCAP 135* 183* 170*      Coagulation Profile: No results for input(s): "INR", "PROTIME" in the last 168 hours.   Radiology Studies: No results found.     Feliciana Horn M.D. [facility] 10/05/2023, 12:05 PM  Available via Epic secure chat 7am-7pm After 7 pm, please refer to night coverage provider listed on amion.

## 2023-10-06 DIAGNOSIS — C8338 Diffuse large B-cell lymphoma, lymph nodes of multiple sites: Secondary | ICD-10-CM | POA: Diagnosis not present

## 2023-10-06 DIAGNOSIS — I10 Essential (primary) hypertension: Secondary | ICD-10-CM | POA: Diagnosis not present

## 2023-10-06 DIAGNOSIS — D638 Anemia in other chronic diseases classified elsewhere: Secondary | ICD-10-CM | POA: Diagnosis not present

## 2023-10-06 DIAGNOSIS — J189 Pneumonia, unspecified organism: Secondary | ICD-10-CM | POA: Diagnosis not present

## 2023-10-06 LAB — GLUCOSE, CAPILLARY
Glucose-Capillary: 134 mg/dL — ABNORMAL HIGH (ref 70–99)
Glucose-Capillary: 147 mg/dL — ABNORMAL HIGH (ref 70–99)
Glucose-Capillary: 154 mg/dL — ABNORMAL HIGH (ref 70–99)
Glucose-Capillary: 161 mg/dL — ABNORMAL HIGH (ref 70–99)
Glucose-Capillary: 179 mg/dL — ABNORMAL HIGH (ref 70–99)
Glucose-Capillary: 184 mg/dL — ABNORMAL HIGH (ref 70–99)

## 2023-10-06 NOTE — Plan of Care (Signed)
  Problem: Education: Goal: Knowledge of General Education information will improve Description: Including pain rating scale, medication(s)/side effects and non-pharmacologic comfort measures Outcome: Progressing   Problem: Health Behavior/Discharge Planning: Goal: Ability to manage health-related needs will improve Outcome: Progressing   Problem: Clinical Measurements: Goal: Ability to maintain clinical measurements within normal limits will improve Outcome: Progressing Goal: Will remain free from infection Outcome: Progressing Goal: Diagnostic test results will improve Outcome: Progressing Goal: Respiratory complications will improve Outcome: Progressing Goal: Cardiovascular complication will be avoided Outcome: Progressing   Problem: Activity: Goal: Risk for activity intolerance will decrease Outcome: Progressing   Problem: Nutrition: Goal: Adequate nutrition will be maintained Outcome: Progressing   Problem: Coping: Goal: Level of anxiety will decrease Outcome: Progressing   Problem: Elimination: Goal: Will not experience complications related to bowel motility Outcome: Progressing Goal: Will not experience complications related to urinary retention Outcome: Progressing   Problem: Pain Managment: Goal: General experience of comfort will improve and/or be controlled Outcome: Progressing   Problem: Safety: Goal: Ability to remain free from injury will improve Outcome: Progressing   Problem: Skin Integrity: Goal: Risk for impaired skin integrity will decrease Outcome: Progressing   Problem: Education: Goal: Ability to describe self-care measures that may prevent or decrease complications (Diabetes Survival Skills Education) will improve Outcome: Progressing   Problem: Coping: Goal: Ability to adjust to condition or change in health will improve Outcome: Progressing   Problem: Fluid Volume: Goal: Ability to maintain a balanced intake and output will  improve Outcome: Progressing   Problem: Health Behavior/Discharge Planning: Goal: Ability to identify and utilize available resources and services will improve Outcome: Progressing Goal: Ability to manage health-related needs will improve Outcome: Progressing   Problem: Metabolic: Goal: Ability to maintain appropriate glucose levels will improve Outcome: Progressing   Problem: Nutritional: Goal: Maintenance of adequate nutrition will improve Outcome: Progressing Goal: Progress toward achieving an optimal weight will improve Outcome: Progressing   Problem: Skin Integrity: Goal: Risk for impaired skin integrity will decrease Outcome: Progressing   Problem: Tissue Perfusion: Goal: Adequacy of tissue perfusion will improve Outcome: Progressing   Problem: Fluid Volume: Goal: Hemodynamic stability will improve Outcome: Progressing   Problem: Clinical Measurements: Goal: Diagnostic test results will improve Outcome: Progressing Goal: Signs and symptoms of infection will decrease Outcome: Progressing   Problem: Respiratory: Goal: Ability to maintain adequate ventilation will improve Outcome: Progressing   Problem: Education: Goal: Knowledge about tracheostomy care/management will improve Outcome: Progressing   Problem: Activity: Goal: Ability to tolerate increased activity will improve Outcome: Progressing   Problem: Health Behavior/Discharge Planning: Goal: Ability to manage tracheostomy will improve Outcome: Progressing   Problem: Respiratory: Goal: Patent airway maintenance will improve Outcome: Progressing   Problem: Role Relationship: Goal: Ability to communicate will improve Outcome: Progressing

## 2023-10-06 NOTE — Progress Notes (Signed)
 [facility]                                                                               Jesus [name], is a 68 y.o. male, DOB - [birth year], ZOX:[id number] Admit date - 09/25/2023    Outpatient Primary MD for the patient is [name], MD  LOS - 11  days    Brief summary   Jesus [name] is an 68 y.o. male past medical history of diffuse large B-cell lymphoma, metastatic laryngeal squamous carcinoma peritracheal mass status post tracheostomy on 08/03/2023 by thoracic surgery at [facility], essential hypertension chronic diastolic dysfunction anemia of chronic disease transferred from [facility] due to respiratory distress upon arrival was found tachycardic tachypneic and requiring 6 L of oxygen had a temperature of 102, with purulent tracheal secretions white count of 12 admitted on the triad service for sepsis secondary to pneumonia transferred to PCCM on 09/26/2023 CT scan of the neck revealed extensive debris encompassing the tracheostomy affected by tumor and necrotic tumor with trach esophageal fistula.  Transfer back to [facility] 10/01/2023 on empiric antibiotics he is to complete a 7-day course of IV Unasyn  followed by 14 days of Augmentin  for suppressive therapy.    Assessment & Plan    Assessment and Plan:    Sepsis secondary to Multifocal pneumonia/acute respiratory failure with hypoxia secondary to malignancy: He completed 7-day course of IV Unasyn , now will continue 14-day course of Augmentin  for suppressive therapy. PCCM and ENT, relate infection may lead to destructive airway compromise and tumor will continue to progress without chemotherapy which she is not a candidate to receive due to his infection. 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 Follow-up with oncology as an outpatient. Oncology evaluated the patient on this admission and recommended moving towards  hospice care. Palliative care met with family and patient, they decided to continue aggressive treatment, he is a full code.  Patient with increased secretions, Robitussin added.  CXR showing Improving bibasilar aeration with residual streaky opacities in both lung bases. He wanted to go to skilled nursing facility or LTAC.   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HCC)/with paratracheal mass status post tracheostomy on 08/03/2023 at [facility] / metastatic glandular squamous cell carcinoma: Oncology was consulted he was previously seen by Dr. Elvan Hamel and Dr. Teofilo Fellers who did not feel intervention were possible he was seen at [facility] and was started on carboplatin  and Taxol. ENT was involved in the care and there is no good surgical repair option nor good options to replace the current tracheostomy tube without risking airway loss though he is not a candidate for surgical intervention. Not  a candidate for treatment at this time.  Palliative care was consulted and family and patient wants to continue aggressive care. Physicians have explained to the patient the severity of the case and they have unrealistic expectations. the patient understands the situation.   Anemia of chronic disease: Hemoglobin today is 6.9. transfuse 1 unit of prbc transfusion. Repeat H& H is 8.1 and 8.3 and stable.  Likely multifactorial secondary to malignancy and poor  nutrition. Transfuse to keep hemoglobin greater than 7.  Repeat labs in am.     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   Thrombocytosis: Likely reactive.  They are improving slowly.   Essential hypertension Well controlled BP parameters.    Hyperlipidemia Stable.   Chronic diastolic CHF.  CXR showing Possible small pleural effusions.   he appears Euvolemic.    Nutrition:  On tube feeds. G-tube inserted on 06/21/2023.   Type 2 DM with hyperglycemia.  Last A1c is 8.9/ recheck A1c in am.  CBG (last 3)  Recent Labs     10/06/23 0444 10/06/23 0847 10/06/23 1201  GLUCAP 147* 179* 134*   Resume Semglee  5 units and SSI.     Disposition:  Insurance denied LTAC. Patient appealing. Toc on board.   RN Pressure Injury Documentation: Pressure Injury 09/25/23 Coccyx Stage 1 -  Intact skin with non-blanchable redness of a localized area usually over a bony prominence. in gluteal cleft and bottom of sacrum/coccyx (Active)  09/25/23 2150  Location: Coccyx  Location Orientation:   Staging: Stage 1 -  Intact skin with non-blanchable redness of a localized area usually over a bony prominence.  Wound Description (Comments): in gluteal cleft and bottom of sacrum/coccyx  Present on Admission: Yes  Dressing Type Foam - Lift dressing to assess site every shift 10/05/23 1921   Local wound care.  Malnutrition Type:  Nutrition Problem: Moderate Malnutrition Etiology: chronic illness, cancer and cancer related treatments   Malnutrition Characteristics:  Signs/Symptoms: mild fat depletion, mild muscle depletion, percent weight loss   Nutrition Interventions:  Interventions: Tube feeding, Prostat  Estimated body mass index is 31.06 kg/m as calculated from the following:   Height as of this encounter: 6' (1.829 m).   Weight as of this encounter: 103.9 kg.  Code Status: full code.  DVT Prophylaxis:  Place and maintain sequential compression device Start: 10/04/23 0459 Place TED hose Start: 10/04/23 0459   Level of Care: Level of care: Med-Surg Family Communication: Updated patient's family at bedside.   Disposition Plan:     Remains inpatient appropriate:  placement.   Procedures:  None.   Consultants:   Palliative care.   Antimicrobials:   Anti-infectives (From admission, onward)    Start     Dose/Rate Route Frequency Ordered Stop   10/17/23 1000  amoxicillin -clavulanate (AUGMENTIN ) 500-125 MG per tablet 1 tablet       Placed in "Followed by" Linked Group   1 tablet Oral 2 times daily 09/29/23  1152     10/03/23 1000  amoxicillin -clavulanate (AUGMENTIN ) 875-125 MG per tablet 1 tablet       Placed in "Followed by" Linked Group   1 tablet Per Tube Every 12 hours 09/29/23 1152 10/17/23 0959   09/29/23 1200  Ampicillin -Sulbactam (UNASYN ) 3 g in sodium chloride  0.9 % 100 mL IVPB        3 g 200 mL/hr over 30 Minutes Intravenous Every 6 hours 09/29/23 1100 10/02/23 1824   09/28/23 1800  Vancomycin  (VANCOCIN ) 1,250 mg in sodium chloride  0.9 % 250 mL IVPB  Status:  Discontinued        1,250 mg 166.7 mL/hr over 90 Minutes Intravenous Every 12 hours 09/28/23 1009 09/29/23 1058   09/27/23 0600  vancomycin  (VANCOREADY) IVPB 1500 mg/300 mL  Status:  Discontinued        1,500 mg 150 mL/hr over 120 Minutes Intravenous Every 12 hours 09/26/23 1747 09/28/23 1009   09/26/23 1845  vancomycin  (VANCOREADY) IVPB 2000 mg/400 mL  2,000 mg 200 mL/hr over 120 Minutes Intravenous  Once 09/26/23 1747 09/26/23 2053   09/26/23 1800  piperacillin -tazobactam (ZOSYN ) IVPB 3.375 g  Status:  Discontinued        3.375 g 12.5 mL/hr over 240 Minutes Intravenous Every 8 hours 09/26/23 1747 09/29/23 1058   09/26/23 0000  cefTRIAXone  (ROCEPHIN ) 2 g in sodium chloride  0.9 % 100 mL IVPB  Status:  Discontinued        2 g 200 mL/hr over 30 Minutes Intravenous Every 24 hours 09/25/23 2327 09/26/23 1726   09/26/23 0000  azithromycin  (ZITHROMAX ) 500 mg in sodium chloride  0.9 % 250 mL IVPB  Status:  Discontinued        500 mg 250 mL/hr over 60 Minutes Intravenous Every 24 hours 09/25/23 2327 09/26/23 1726   09/25/23 1415  vancomycin  (VANCOREADY) IVPB 1750 mg/350 mL        1,750 mg 175 mL/hr over 120 Minutes Intravenous  Once 09/25/23 1402 09/25/23 1854   09/25/23 1400  ceFEPIme  (MAXIPIME ) 2 g in sodium chloride  0.9 % 100 mL IVPB        2 g 200 mL/hr over 30 Minutes Intravenous  Once 09/25/23 1349 09/25/23 1459   09/25/23 1400  metroNIDAZOLE  (FLAGYL ) IVPB 500 mg        500 mg 100 mL/hr over 60 Minutes Intravenous   Once 09/25/23 1349 09/25/23 1854   09/25/23 1400  vancomycin  (VANCOCIN ) IVPB 1000 mg/200 mL premix  Status:  Discontinued        1,000 mg 200 mL/hr over 60 Minutes Intravenous  Once 09/25/23 1349 09/25/23 1357        Medications  Scheduled Meds:  sodium chloride    Intravenous Once   acetaminophen  (TYLENOL ) oral liquid 160 mg/5 mL  650 mg Per Tube Q6H   amoxicillin -clavulanate  1 tablet Per Tube Q12H   Followed by   Cecily Cohen ON 10/17/2023] amoxicillin -clavulanate  1 tablet Oral BID   Chlorhexidine  Gluconate Cloth  6 each Topical Daily   feeding supplement (PROSource TF20)  60 mL Per Tube Daily   fentaNYL   1 patch Transdermal Q48H   gabapentin   300 mg Per Tube Q12H   guaiFENesin -dextromethorphan   10 mL Per Tube Q6H   insulin  aspart  0-9 Units Subcutaneous Q4H   insulin  aspart  2 Units Subcutaneous Q4H   insulin  glargine-yfgn  5 Units Subcutaneous Daily   lactobacillus  1 g Per Tube TID WC   mouth rinse  15 mL Mouth Rinse 4 times per day   pantoprazole  (PROTONIX ) IV  40 mg Intravenous Daily   polyethylene glycol  17 g Per Tube Daily   senna  1 tablet Per Tube Daily   sodium chloride  flush  10-40 mL Intracatheter Q12H   Continuous Infusions:  feeding supplement (OSMOLITE 1.5 CAL) 1,000 mL (10/06/23 0257)   PRN Meds:.morphine  injection, [DISCONTINUED] ondansetron  **OR** ondansetron  (ZOFRAN ) IV, mouth rinse, oxyCODONE , sodium chloride  flush    Subjective:   Jesus [name] was seen and examined today.  No new complaints, comfortable.   Objective:   Vitals:   10/06/23 0503 10/06/23 0745 10/06/23 0910 10/06/23 1206  BP:  111/70    Pulse:  87 96   Resp:  17 18   Temp:  98.1 F (36.7 C)    TempSrc:      SpO2:  98% 93% 95%  Weight: 103.9 kg     Height:        Intake/Output Summary (Last 24 hours) at 10/06/2023 1352 Last data filed  at 10/06/2023 0942 Gross per 24 hour  Intake 2558.17 ml  Output 1950 ml  Net 608.17 ml   Filed Weights   10/04/23 0444 10/05/23 0500 10/06/23  0503  Weight: 101.6 kg 101.6 kg 103.9 kg     Exam General exam: Appears calm and comfortable  Respiratory system: diminished at bases, s/p trach, on oxygen.  Cardiovascular system: S1 & S2 heard, RRR.  Gastrointestinal system: Abdomen is nondistended, soft and non tender. Central nervous system: Alert and oriented.  Extremities: warm extremities.  Skin: No rashes,  Psychiatry: flat affect.     Data Reviewed:  I have personally reviewed following labs and imaging studies   CBC Lab Results  Component Value Date   WBC 8.5 10/05/2023   RBC 3.00 (L) 10/05/2023   HGB 8.3 (L) 10/05/2023   HCT 26.9 (L) 10/05/2023   MCV 89.7 10/05/2023   MCH 27.7 10/05/2023   PLT 754 (H) 10/05/2023   MCHC 30.9 10/05/2023   RDW 18.5 (H) 10/05/2023   LYMPHSABS 1.9 10/05/2023   MONOABS 0.7 10/05/2023   EOSABS 0.1 10/05/2023   BASOSABS 0.0 10/05/2023     Last metabolic panel Lab Results  Component Value Date   NA 137 10/02/2023   K 4.8 10/02/2023   CL 98 10/02/2023   CO2 31 10/02/2023   BUN 14 10/02/2023   CREATININE 0.88 10/02/2023   GLUCOSE 162 (H) 10/02/2023   GFRNONAA >60 10/02/2023   GFRAA >60 11/28/2019   CALCIUM  8.5 (L) 10/02/2023   PHOS 4.1 09/27/2023   PROT 6.5 09/27/2023   ALBUMIN 1.9 (L) 09/27/2023   LABGLOB 2.3 08/21/2019   AGRATIO 2.0 08/21/2019   BILITOT 0.7 09/27/2023   ALKPHOS 120 09/27/2023   AST 11 (L) 09/27/2023   ALT 11 09/27/2023   ANIONGAP 8 10/02/2023    CBG (last 3)  Recent Labs    10/06/23 0444 10/06/23 0847 10/06/23 1201  GLUCAP 147* 179* 134*      Coagulation Profile: No results for input(s): "INR", "PROTIME" in the last 168 hours.   Radiology Studies: DG CHEST PORT 1 VIEW Result Date: 10/05/2023 CLINICAL DATA:  Dyspnea and respiratory distress. EXAM: PORTABLE CHEST 1 VIEW COMPARISON:  Radiograph and CT 09/25/2023 FINDINGS: Tracheostomy tube tip at the thoracic inlet. Accessed right chest port in place. Stable cardiomegaly. Unchanged  mediastinal contours. Improving bibasilar aeration. Residual streaky opacities in both lung bases. No pneumothorax. There may be small pleural effusions. IMPRESSION: 1. Improving bibasilar aeration with residual streaky opacities in both lung bases. Possible small pleural effusions. 2. Stable cardiomegaly. 3. Tracheostomy tube tip at the thoracic inlet. Electronically Signed   By: Chadwick Colonel M.D.   On: 10/05/2023 16:16       Feliciana Horn M.D. [facility] 10/06/2023, 1:52 PM  Available via Epic secure chat 7am-7pm After 7 pm, please refer to night coverage provider listed on amion.

## 2023-10-07 DIAGNOSIS — D638 Anemia in other chronic diseases classified elsewhere: Secondary | ICD-10-CM | POA: Diagnosis not present

## 2023-10-07 DIAGNOSIS — I10 Essential (primary) hypertension: Secondary | ICD-10-CM | POA: Diagnosis not present

## 2023-10-07 DIAGNOSIS — C8338 Diffuse large B-cell lymphoma, lymph nodes of multiple sites: Secondary | ICD-10-CM | POA: Diagnosis not present

## 2023-10-07 DIAGNOSIS — J189 Pneumonia, unspecified organism: Secondary | ICD-10-CM | POA: Diagnosis not present

## 2023-10-07 LAB — BASIC METABOLIC PANEL WITH GFR
Anion gap: 8 (ref 5–15)
BUN: 28 mg/dL — ABNORMAL HIGH (ref 8–23)
CO2: 29 mmol/L (ref 22–32)
Calcium: 8.9 mg/dL (ref 8.9–10.3)
Chloride: 96 mmol/L — ABNORMAL LOW (ref 98–111)
Creatinine, Ser: 0.95 mg/dL (ref 0.61–1.24)
GFR, Estimated: >60 mL/min (ref >60)
Glucose, Bld: 152 mg/dL — ABNORMAL HIGH (ref 70–99)
Potassium: 5.9 mmol/L — ABNORMAL HIGH (ref 3.5–5.1)
Sodium: 133 mmol/L — ABNORMAL LOW (ref 135–145)

## 2023-10-07 LAB — GLUCOSE, CAPILLARY
Glucose-Capillary: 129 mg/dL — ABNORMAL HIGH (ref 70–99)
Glucose-Capillary: 134 mg/dL — ABNORMAL HIGH (ref 70–99)
Glucose-Capillary: 168 mg/dL — ABNORMAL HIGH (ref 70–99)
Glucose-Capillary: 176 mg/dL — ABNORMAL HIGH (ref 70–99)
Glucose-Capillary: 186 mg/dL — ABNORMAL HIGH (ref 70–99)
Glucose-Capillary: 191 mg/dL — ABNORMAL HIGH (ref 70–99)
Glucose-Capillary: 201 mg/dL — ABNORMAL HIGH (ref 70–99)

## 2023-10-07 LAB — LIPID PANEL
Cholesterol: 99 mg/dL (ref 0–200)
HDL: 28 mg/dL — ABNORMAL LOW (ref >40)
LDL Cholesterol: 46 mg/dL (ref 0–99)
Total CHOL/HDL Ratio: 3.5 ratio
Triglycerides: 126 mg/dL (ref <150)
VLDL: 25 mg/dL (ref 0–40)

## 2023-10-07 LAB — CBC WITH DIFFERENTIAL/PLATELET
Abs Immature Granulocytes: 0.08 10*3/uL — ABNORMAL HIGH (ref 0.00–0.07)
Basophils Absolute: 0 10*3/uL (ref 0.0–0.1)
Basophils Relative: 0 %
Eosinophils Absolute: 0.1 10*3/uL (ref 0.0–0.5)
Eosinophils Relative: 1 %
HCT: 28.4 % — ABNORMAL LOW (ref 39.0–52.0)
Hemoglobin: 8.6 g/dL — ABNORMAL LOW (ref 13.0–17.0)
Immature Granulocytes: 1 %
Lymphocytes Relative: 18 %
Lymphs Abs: 2 10*3/uL (ref 0.7–4.0)
MCH: 27 pg (ref 26.0–34.0)
MCHC: 30.3 g/dL (ref 30.0–36.0)
MCV: 89 fL (ref 80.0–100.0)
Monocytes Absolute: 0.9 10*3/uL (ref 0.1–1.0)
Monocytes Relative: 9 %
Neutro Abs: 8 10*3/uL — ABNORMAL HIGH (ref 1.7–7.7)
Neutrophils Relative %: 71 %
Platelets: 730 10*3/uL — ABNORMAL HIGH (ref 150–400)
RBC: 3.19 MIL/uL — ABNORMAL LOW (ref 4.22–5.81)
RDW: 18.3 % — ABNORMAL HIGH (ref 11.5–15.5)
WBC: 11.1 10*3/uL — ABNORMAL HIGH (ref 4.0–10.5)
nRBC: 0 % (ref 0.0–0.2)

## 2023-10-07 LAB — HEMOGLOBIN A1C
Hgb A1c MFr Bld: 6.6 % — ABNORMAL HIGH (ref 4.8–5.6)
Mean Plasma Glucose: 142.72 mg/dL

## 2023-10-07 MED ORDER — POLYETHYLENE GLYCOL 3350 17 G PO PACK
17.0000 g | PACK | Freq: Two times a day (BID) | ORAL | Status: DC
  Administered 2023-10-08 – 2023-10-13 (×7): 17 g
  Filled 2023-10-07 (×12): qty 1

## 2023-10-07 MED ORDER — SODIUM ZIRCONIUM CYCLOSILICATE 10 G PO PACK
10.0000 g | PACK | Freq: Two times a day (BID) | ORAL | Status: AC
  Administered 2023-10-07 – 2023-10-08 (×2): 10 g via ORAL
  Filled 2023-10-07 (×2): qty 1

## 2023-10-07 NOTE — Progress Notes (Signed)
 [facility]                                                                               Jesus [name], is a 68 y.o. male, DOB - 1955-06-28, ZOX:[id number] Admit date - 09/25/2023    Outpatient Primary MD for the patient is [name], MD  LOS - 12  days    Brief summary   Jesus [name] is an 68 y.o. male past medical history of diffuse large B-cell lymphoma, metastatic laryngeal squamous carcinoma peritracheal mass status post tracheostomy on 08/03/2023 by thoracic surgery at [facility], essential hypertension chronic diastolic dysfunction anemia of chronic disease transferred from [facility] due to respiratory distress upon arrival was found tachycardic tachypneic and requiring 6 L of oxygen had a temperature of 102, with purulent tracheal secretions white count of 12 admitted on the triad service for sepsis secondary to pneumonia transferred to PCCM on 09/26/2023 CT scan of the neck revealed extensive debris encompassing the tracheostomy affected by tumor and necrotic tumor with trach esophageal fistula.  Transfer back to [facility] 10/01/2023 on empiric antibiotics he is to complete a 7-day course of IV Unasyn  followed by 14 days of Augmentin  for suppressive therapy.    Assessment & Plan    Assessment and Plan:    Sepsis secondary to Multifocal pneumonia/acute respiratory failure with hypoxia secondary to malignancy: He completed 7-day course of IV Unasyn , now will continue 14-day course of Augmentin  for suppressive therapy. PCCM and ENT, relate infection may lead to destructive airway compromise and tumor will continue to progress without chemotherapy which she is not a candidate to receive due to his infection. 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 Follow-up with oncology as an outpatient. Oncology evaluated the patient on this admission and recommended moving towards  hospice care. Palliative care met with family and patient, they decided to continue aggressive treatment, he is a full code.  Patient with increased secretions, Robitussin added.  CXR showing Improving bibasilar aeration with residual streaky opacities in both lung bases. He wanted to go to skilled nursing facility or LTAC. No new complaints.    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HCC)/with paratracheal mass status post tracheostomy on 08/03/2023 at [facility] / metastatic glandular squamous cell carcinoma: Oncology was consulted he was previously seen by Dr. Elvan Hamel and Dr. Teofilo Fellers who did not feel intervention were possible he was seen at [facility] and was started on carboplatin  and Taxol. ENT was involved in the care and there is no good surgical repair option nor good options to replace the current tracheostomy tube without risking airway loss though he is not a candidate for surgical intervention. Not  a candidate for treatment at this time.  Palliative care was consulted and family and patient wants to continue aggressive care. Physicians have explained to the patient the severity of the case and they have unrealistic expectations. the patient understands the situation.   Anemia of chronic disease: Hemoglobin today is 6.9. transfuse 1 unit of prbc transfusion. Repeat H& H is 8.1 and 8.3 and stable.  Likely multifactorial secondary  to malignancy and poor nutrition. Transfuse to keep hemoglobin greater than 7.  Repeat labs in am.     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   Thrombocytosis: Likely reactive.  They are improving slowly.   Essential hypertension Well controlled BP parameters.    Hyperlipidemia Stable.   Chronic diastolic CHF.  CXR showing Possible small pleural effusions.   he appears Euvolemic.    Nutrition:  On tube feeds. G-tube inserted on 06/21/2023.   Type 2 DM with hyperglycemia.  Last A1c is 8.9/ recheck A1c in am.  CBG (last 3)   Recent Labs    10/07/23 0354 10/07/23 0855 10/07/23 1254  GLUCAP 168* 129* 201*   Resume Semglee  5 units and SSI.   Hyperkalemia Lokelma  ordered.  Increased miralax  to BID.     Disposition:  Insurance denied LTAC. Patient appealing. Toc on board.   RN Pressure Injury Documentation: Pressure Injury 09/25/23 Coccyx Stage 1 -  Intact skin with non-blanchable redness of a localized area usually over a bony prominence. in gluteal cleft and bottom of sacrum/coccyx (Active)  09/25/23 2150  Location: Coccyx  Location Orientation:   Staging: Stage 1 -  Intact skin with non-blanchable redness of a localized area usually over a bony prominence.  Wound Description (Comments): in gluteal cleft and bottom of sacrum/coccyx  Present on Admission: Yes  Dressing Type Foam - Lift dressing to assess site every shift 10/05/23 1921   Local wound care.  Malnutrition Type:  Nutrition Problem: Moderate Malnutrition Etiology: chronic illness, cancer and cancer related treatments   Malnutrition Characteristics:  Signs/Symptoms: mild fat depletion, mild muscle depletion, percent weight loss   Nutrition Interventions:  Interventions: Tube feeding, Prostat  Estimated body mass index is 31.19 kg/m as calculated from the following:   Height as of this encounter: 6' (1.829 m).   Weight as of this encounter: 104.3 kg.  Code Status: full code.  DVT Prophylaxis:  Place and maintain sequential compression device Start: 10/04/23 0459 Place TED hose Start: 10/04/23 0459   Level of Care: Level of care: Med-Surg Family Communication: Updated patient's family at bedside.   Disposition Plan:     Remains inpatient appropriate:  placement.   Procedures:  None.   Consultants:   Palliative care.   Antimicrobials:   Anti-infectives (From admission, onward)    Start     Dose/Rate Route Frequency Ordered Stop   10/17/23 1000  amoxicillin -clavulanate (AUGMENTIN ) 500-125 MG per tablet 1 tablet        Placed in "Followed by" Linked Group   1 tablet Oral 2 times daily 09/29/23 1152     10/03/23 1000  amoxicillin -clavulanate (AUGMENTIN ) 875-125 MG per tablet 1 tablet       Placed in "Followed by" Linked Group   1 tablet Per Tube Every 12 hours 09/29/23 1152 10/17/23 0959   09/29/23 1200  Ampicillin -Sulbactam (UNASYN ) 3 g in sodium chloride  0.9 % 100 mL IVPB        3 g 200 mL/hr over 30 Minutes Intravenous Every 6 hours 09/29/23 1100 10/02/23 1824   09/28/23 1800  Vancomycin  (VANCOCIN ) 1,250 mg in sodium chloride  0.9 % 250 mL IVPB  Status:  Discontinued        1,250 mg 166.7 mL/hr over 90 Minutes Intravenous Every 12 hours 09/28/23 1009 09/29/23 1058   09/27/23 0600  vancomycin  (VANCOREADY) IVPB 1500 mg/300 mL  Status:  Discontinued        1,500 mg 150 mL/hr over 120 Minutes Intravenous Every 12 hours 09/26/23 1747  09/28/23 1009   09/26/23 1845  vancomycin  (VANCOREADY) IVPB 2000 mg/400 mL        2,000 mg 200 mL/hr over 120 Minutes Intravenous  Once 09/26/23 1747 09/26/23 2053   09/26/23 1800  piperacillin -tazobactam (ZOSYN ) IVPB 3.375 g  Status:  Discontinued        3.375 g 12.5 mL/hr over 240 Minutes Intravenous Every 8 hours 09/26/23 1747 09/29/23 1058   09/26/23 0000  cefTRIAXone  (ROCEPHIN ) 2 g in sodium chloride  0.9 % 100 mL IVPB  Status:  Discontinued        2 g 200 mL/hr over 30 Minutes Intravenous Every 24 hours 09/25/23 2327 09/26/23 1726   09/26/23 0000  azithromycin  (ZITHROMAX ) 500 mg in sodium chloride  0.9 % 250 mL IVPB  Status:  Discontinued        500 mg 250 mL/hr over 60 Minutes Intravenous Every 24 hours 09/25/23 2327 09/26/23 1726   09/25/23 1415  vancomycin  (VANCOREADY) IVPB 1750 mg/350 mL        1,750 mg 175 mL/hr over 120 Minutes Intravenous  Once 09/25/23 1402 09/25/23 1854   09/25/23 1400  ceFEPIme  (MAXIPIME ) 2 g in sodium chloride  0.9 % 100 mL IVPB        2 g 200 mL/hr over 30 Minutes Intravenous  Once 09/25/23 1349 09/25/23 1459   09/25/23 1400  metroNIDAZOLE   (FLAGYL ) IVPB 500 mg        500 mg 100 mL/hr over 60 Minutes Intravenous  Once 09/25/23 1349 09/25/23 1854   09/25/23 1400  vancomycin  (VANCOCIN ) IVPB 1000 mg/200 mL premix  Status:  Discontinued        1,000 mg 200 mL/hr over 60 Minutes Intravenous  Once 09/25/23 1349 09/25/23 1357        Medications  Scheduled Meds:  sodium chloride    Intravenous Once   acetaminophen  (TYLENOL ) oral liquid 160 mg/5 mL  650 mg Per Tube Q6H   amoxicillin -clavulanate  1 tablet Per Tube Q12H   Followed by   Cecily Cohen ON 10/17/2023] amoxicillin -clavulanate  1 tablet Oral BID   Chlorhexidine  Gluconate Cloth  6 each Topical Daily   feeding supplement (PROSource TF20)  60 mL Per Tube Daily   fentaNYL   1 patch Transdermal Q48H   gabapentin   300 mg Per Tube Q12H   guaiFENesin -dextromethorphan   10 mL Per Tube Q6H   insulin  aspart  0-9 Units Subcutaneous Q4H   insulin  aspart  2 Units Subcutaneous Q4H   insulin  glargine-yfgn  5 Units Subcutaneous Daily   lactobacillus  1 g Per Tube TID WC   mouth rinse  15 mL Mouth Rinse 4 times per day   pantoprazole  (PROTONIX ) IV  40 mg Intravenous Daily   polyethylene glycol  17 g Per Tube Daily   senna  1 tablet Per Tube Daily   sodium chloride  flush  10-40 mL Intracatheter Q12H   Continuous Infusions:  feeding supplement (OSMOLITE 1.5 CAL) 1,000 mL (10/07/23 1330)   PRN Meds:.morphine  injection, [DISCONTINUED] ondansetron  **OR** ondansetron  (ZOFRAN ) IV, mouth rinse, oxyCODONE , sodium chloride  flush    Subjective:   Jesus [name] was seen and examined today.   No new complaints.   Objective:   Vitals:   10/07/23 0602 10/07/23 0842 10/07/23 0926 10/07/23 1130  BP:   120/77   Pulse:  96 91 88  Resp:  18 18 18   Temp:   98.1 F (36.7 C)   TempSrc:      SpO2:  98% 99% 95%  Weight: 104.3 kg  Height:        Intake/Output Summary (Last 24 hours) at 10/07/2023 1533 Last data filed at 10/07/2023 0500 Gross per 24 hour  Intake 2352 ml  Output 300 ml  Net  2052 ml   Filed Weights   10/05/23 0500 10/06/23 0503 10/07/23 0602  Weight: 101.6 kg 103.9 kg 104.3 kg     Exam General exam: Appears calm and comfortable  Respiratory system: S/P Trach, on oxygen, air entry fair.  Cardiovascular system: S1 & S2 heard, RRR. No JVD, Gastrointestinal system: Abdomen is nondistended, soft and nontender. S/p PEG Central nervous system: Alert and oriented. Extremities: warm extremities.  Skin: No rashes,  Psychiatry: no agitation.      Data Reviewed:  I have personally reviewed following labs and imaging studies   CBC Lab Results  Component Value Date   WBC 11.1 (H) 10/07/2023   RBC 3.19 (L) 10/07/2023   HGB 8.6 (L) 10/07/2023   HCT 28.4 (L) 10/07/2023   MCV 89.0 10/07/2023   MCH 27.0 10/07/2023   PLT 730 (H) 10/07/2023   MCHC 30.3 10/07/2023   RDW 18.3 (H) 10/07/2023   LYMPHSABS 2.0 10/07/2023   MONOABS 0.9 10/07/2023   EOSABS 0.1 10/07/2023   BASOSABS 0.0 10/07/2023     Last metabolic panel Lab Results  Component Value Date   NA 133 (L) 10/07/2023   K 5.9 (H) 10/07/2023   CL 96 (L) 10/07/2023   CO2 29 10/07/2023   BUN 28 (H) 10/07/2023   CREATININE 0.95 10/07/2023   GLUCOSE 152 (H) 10/07/2023   GFRNONAA >60 10/07/2023   GFRAA >60 11/28/2019   CALCIUM  8.9 10/07/2023   PHOS 4.1 09/27/2023   PROT 6.5 09/27/2023   ALBUMIN 1.9 (L) 09/27/2023   LABGLOB 2.3 08/21/2019   AGRATIO 2.0 08/21/2019   BILITOT 0.7 09/27/2023   ALKPHOS 120 09/27/2023   AST 11 (L) 09/27/2023   ALT 11 09/27/2023   ANIONGAP 8 10/07/2023    CBG (last 3)  Recent Labs    10/07/23 0354 10/07/23 0855 10/07/23 1254  GLUCAP 168* 129* 201*      Coagulation Profile: No results for input(s): "INR", "PROTIME" in the last 168 hours.   Radiology Studies: No results found.      Feliciana Horn M.D. [facility] 10/07/2023, 3:33 PM  Available via Epic secure chat 7am-7pm After 7 pm, please refer to night coverage provider listed on  amion.

## 2023-10-07 NOTE — Plan of Care (Signed)
  Problem: Education: Goal: Knowledge of General Education information will improve Description: Including pain rating scale, medication(s)/side effects and non-pharmacologic comfort measures Outcome: Progressing   Problem: Health Behavior/Discharge Planning: Goal: Ability to manage health-related needs will improve Outcome: Progressing   Problem: Clinical Measurements: Goal: Ability to maintain clinical measurements within normal limits will improve Outcome: Progressing Goal: Will remain free from infection Outcome: Progressing Goal: Diagnostic test results will improve Outcome: Progressing Goal: Respiratory complications will improve Outcome: Progressing Goal: Cardiovascular complication will be avoided Outcome: Progressing   Problem: Activity: Goal: Risk for activity intolerance will decrease Outcome: Progressing   Problem: Nutrition: Goal: Adequate nutrition will be maintained Outcome: Progressing   Problem: Coping: Goal: Level of anxiety will decrease Outcome: Progressing   Problem: Elimination: Goal: Will not experience complications related to bowel motility Outcome: Progressing Goal: Will not experience complications related to urinary retention Outcome: Progressing   Problem: Pain Managment: Goal: General experience of comfort will improve and/or be controlled Outcome: Progressing   Problem: Safety: Goal: Ability to remain free from injury will improve Outcome: Progressing   Problem: Skin Integrity: Goal: Risk for impaired skin integrity will decrease Outcome: Progressing   Problem: Education: Goal: Ability to describe self-care measures that may prevent or decrease complications (Diabetes Survival Skills Education) will improve Outcome: Progressing   Problem: Coping: Goal: Ability to adjust to condition or change in health will improve Outcome: Progressing   Problem: Fluid Volume: Goal: Ability to maintain a balanced intake and output will  improve Outcome: Progressing   Problem: Health Behavior/Discharge Planning: Goal: Ability to identify and utilize available resources and services will improve Outcome: Progressing Goal: Ability to manage health-related needs will improve Outcome: Progressing   Problem: Metabolic: Goal: Ability to maintain appropriate glucose levels will improve Outcome: Progressing   Problem: Nutritional: Goal: Maintenance of adequate nutrition will improve Outcome: Progressing Goal: Progress toward achieving an optimal weight will improve Outcome: Progressing   Problem: Skin Integrity: Goal: Risk for impaired skin integrity will decrease Outcome: Progressing   Problem: Tissue Perfusion: Goal: Adequacy of tissue perfusion will improve Outcome: Progressing   Problem: Fluid Volume: Goal: Hemodynamic stability will improve Outcome: Progressing   Problem: Clinical Measurements: Goal: Diagnostic test results will improve Outcome: Progressing Goal: Signs and symptoms of infection will decrease Outcome: Progressing   Problem: Respiratory: Goal: Ability to maintain adequate ventilation will improve Outcome: Progressing   Problem: Education: Goal: Knowledge about tracheostomy care/management will improve Outcome: Progressing   Problem: Activity: Goal: Ability to tolerate increased activity will improve Outcome: Progressing   Problem: Health Behavior/Discharge Planning: Goal: Ability to manage tracheostomy will improve Outcome: Progressing   Problem: Respiratory: Goal: Patent airway maintenance will improve Outcome: Progressing   Problem: Role Relationship: Goal: Ability to communicate will improve Outcome: Progressing

## 2023-10-08 DIAGNOSIS — I10 Essential (primary) hypertension: Secondary | ICD-10-CM | POA: Diagnosis not present

## 2023-10-08 DIAGNOSIS — D638 Anemia in other chronic diseases classified elsewhere: Secondary | ICD-10-CM | POA: Diagnosis not present

## 2023-10-08 DIAGNOSIS — J189 Pneumonia, unspecified organism: Secondary | ICD-10-CM | POA: Diagnosis not present

## 2023-10-08 DIAGNOSIS — C8338 Diffuse large B-cell lymphoma, lymph nodes of multiple sites: Secondary | ICD-10-CM | POA: Diagnosis not present

## 2023-10-08 LAB — HEMOGLOBIN AND HEMATOCRIT, BLOOD
HCT: 28.1 % — ABNORMAL LOW (ref 39.0–52.0)
Hemoglobin: 8.8 g/dL — ABNORMAL LOW (ref 13.0–17.0)

## 2023-10-08 LAB — BASIC METABOLIC PANEL WITH GFR
Anion gap: 10 (ref 5–15)
BUN: 36 mg/dL — ABNORMAL HIGH (ref 8–23)
CO2: 26 mmol/L (ref 22–32)
Calcium: 9 mg/dL (ref 8.9–10.3)
Chloride: 93 mmol/L — ABNORMAL LOW (ref 98–111)
Creatinine, Ser: 1.07 mg/dL (ref 0.61–1.24)
GFR, Estimated: >60 mL/min (ref >60)
Glucose, Bld: 189 mg/dL — ABNORMAL HIGH (ref 70–99)
Potassium: 5.5 mmol/L — ABNORMAL HIGH (ref 3.5–5.1)
Sodium: 129 mmol/L — ABNORMAL LOW (ref 135–145)

## 2023-10-08 LAB — GLUCOSE, CAPILLARY
Glucose-Capillary: 182 mg/dL — ABNORMAL HIGH (ref 70–99)
Glucose-Capillary: 167 mg/dL — ABNORMAL HIGH (ref 70–99)
Glucose-Capillary: 160 mg/dL — ABNORMAL HIGH (ref 70–99)
Glucose-Capillary: 151 mg/dL — ABNORMAL HIGH (ref 70–99)
Glucose-Capillary: 154 mg/dL — ABNORMAL HIGH (ref 70–99)
Glucose-Capillary: 226 mg/dL — ABNORMAL HIGH (ref 70–99)

## 2023-10-08 MED ORDER — CARBAMIDE PEROXIDE 6.5 % OT SOLN
5.0000 [drp] | Freq: Two times a day (BID) | OTIC | Status: DC
  Administered 2023-10-08 – 2023-10-13 (×9): 5 [drp] via OTIC
  Filled 2023-10-08: qty 15

## 2023-10-08 NOTE — Progress Notes (Signed)
 [facility]                                                                               Kennett Symes, is a 68 y.o. male, DOB - 11/28/1955, OHY:[id number] Admit date - 09/25/2023    Outpatient Primary MD for the patient is [name], MD  LOS - 13  days    Brief summary   Jesus [name] is an 68 y.o. male past medical history of diffuse large B-cell lymphoma, metastatic laryngeal squamous carcinoma peritracheal mass status post tracheostomy on 08/03/2023 by thoracic surgery at [facility], essential hypertension chronic diastolic dysfunction anemia of chronic disease transferred from [facility] due to respiratory distress upon arrival was found tachycardic tachypneic and requiring 6 L of oxygen had a temperature of 102, with purulent tracheal secretions white count of 12 admitted on the triad service for sepsis secondary to pneumonia transferred to PCCM on 09/26/2023 CT scan of the neck revealed extensive debris encompassing the tracheostomy affected by tumor and necrotic tumor with trach esophageal fistula.  Transfer back to [facility] 10/01/2023 on empiric antibiotics he is to complete a 7-day course of IV Unasyn  followed by 14 days of Augmentin  for suppressive therapy.    Assessment & Plan    Assessment and Plan:    Sepsis secondary to Multifocal pneumonia/acute respiratory failure with hypoxia secondary to malignancy: He completed 7-day course of IV Unasyn , now will continue 14-day course of Augmentin  for suppressive therapy. PCCM and ENT, relate infection may lead to destructive airway compromise and tumor will continue to progress without chemotherapy which she is not a candidate to receive due to his infection. 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 Follow-up with oncology as an outpatient. Oncology evaluated the patient on this admission and recommended moving towards  hospice care. Palliative care met with family and patient, they decided to continue aggressive treatment, he is a full code.  Patient with increased secretions, Robitussin added.  CXR showing Improving bibasilar aeration with residual streaky opacities in both lung bases. He wanted to go to skilled nursing facility or LTAC. No new complaints.    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HCC)/with paratracheal mass status post tracheostomy on 08/03/2023 at [facility] / metastatic glandular squamous cell carcinoma: Oncology was consulted he was previously seen by Dr. Elvan Hamel and Dr. Teofilo Fellers who did not feel intervention were possible he was seen at [facility] and was started on carboplatin  and Taxol. ENT was involved in the care and there is no good surgical repair option nor good options to replace the current tracheostomy tube without risking airway loss though he is not a candidate for surgical intervention. Not  a candidate for treatment at this time.  Palliative care was consulted and family and patient wants to continue aggressive care. Physicians have explained to the patient the severity of the case and they have unrealistic expectations. the patient understands the situation.   Anemia of chronic disease: Hemoglobin today is 6.9. transfuse 1 unit of prbc transfusion. Repeat H& H is 8.1 and 8.3 and stable.  Likely multifactorial secondary  to malignancy and poor nutrition. Transfuse to keep hemoglobin greater than 7.      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   Thrombocytosis: Likely reactive.  They are improving slowly.   Essential hypertension Optimal BP parameters.    Hyperlipidemia Stable.   Chronic diastolic CHF.  CXR showing Possible small pleural effusions.   he appears Euvolemic.    Nutrition:  On tube feeds. G-tube inserted on 06/21/2023.   Clogged ears:  - dubrox ear drops.    Type 2 DM with hyperglycemia.  Last A1c is 8.9, recheck A1c is 6.6%.   CBG (last 3)  Recent Labs    10/08/23 0851 10/08/23 1215 10/08/23 1612  GLUCAP 167* 160* 151*   Resume Semglee  5 units and SSI.   Hyperkalemia Lokelma  ordered.  Increased miralax  to BID.  Repeat BMP today.     Disposition:  Insurance denied LTAC. Patient appealing. Toc on board.   RN Pressure Injury Documentation: Pressure Injury 09/25/23 Coccyx Stage 1 -  Intact skin with non-blanchable redness of a localized area usually over a bony prominence. in gluteal cleft and bottom of sacrum/coccyx (Active)  09/25/23 2150  Location: Coccyx  Location Orientation:   Staging: Stage 1 -  Intact skin with non-blanchable redness of a localized area usually over a bony prominence.  Wound Description (Comments): in gluteal cleft and bottom of sacrum/coccyx  Present on Admission: Yes  Dressing Type Foam - Lift dressing to assess site every shift 10/07/23 2000   Local wound care.  Malnutrition Type:  Nutrition Problem: Moderate Malnutrition Etiology: chronic illness, cancer and cancer related treatments   Malnutrition Characteristics:  Signs/Symptoms: mild fat depletion, mild muscle depletion, percent weight loss   Nutrition Interventions:  Interventions: Tube feeding, Prostat  Estimated body mass index is 29.99 kg/m as calculated from the following:   Height as of this encounter: 6' (1.829 m).   Weight as of this encounter: 100.3 kg.  Code Status: full code.  DVT Prophylaxis:  Place and maintain sequential compression device Start: 10/04/23 0459 Place TED hose Start: 10/04/23 0459   Level of Care: Level of care: Med-Surg Family Communication: Updated patient's family at bedside.   Disposition Plan:     Remains inpatient appropriate:  placement.   Procedures:  None.   Consultants:   Palliative care.   Antimicrobials:   Anti-infectives (From admission, onward)    Start     Dose/Rate Route Frequency Ordered Stop   10/17/23 1000  amoxicillin -clavulanate (AUGMENTIN )  500-125 MG per tablet 1 tablet       Placed in "Followed by" Linked Group   1 tablet Oral 2 times daily 09/29/23 1152     10/03/23 1000  amoxicillin -clavulanate (AUGMENTIN ) 875-125 MG per tablet 1 tablet       Placed in "Followed by" Linked Group   1 tablet Per Tube Every 12 hours 09/29/23 1152 10/17/23 0959   09/29/23 1200  Ampicillin -Sulbactam (UNASYN ) 3 g in sodium chloride  0.9 % 100 mL IVPB        3 g 200 mL/hr over 30 Minutes Intravenous Every 6 hours 09/29/23 1100 10/02/23 1824   09/28/23 1800  Vancomycin  (VANCOCIN ) 1,250 mg in sodium chloride  0.9 % 250 mL IVPB  Status:  Discontinued        1,250 mg 166.7 mL/hr over 90 Minutes Intravenous Every 12 hours 09/28/23 1009 09/29/23 1058   09/27/23 0600  vancomycin  (VANCOREADY) IVPB 1500 mg/300 mL  Status:  Discontinued        1,500 mg 150 mL/hr  over 120 Minutes Intravenous Every 12 hours 09/26/23 1747 09/28/23 1009   09/26/23 1845  vancomycin  (VANCOREADY) IVPB 2000 mg/400 mL        2,000 mg 200 mL/hr over 120 Minutes Intravenous  Once 09/26/23 1747 09/26/23 2053   09/26/23 1800  piperacillin -tazobactam (ZOSYN ) IVPB 3.375 g  Status:  Discontinued        3.375 g 12.5 mL/hr over 240 Minutes Intravenous Every 8 hours 09/26/23 1747 09/29/23 1058   09/26/23 0000  cefTRIAXone  (ROCEPHIN ) 2 g in sodium chloride  0.9 % 100 mL IVPB  Status:  Discontinued        2 g 200 mL/hr over 30 Minutes Intravenous Every 24 hours 09/25/23 2327 09/26/23 1726   09/26/23 0000  azithromycin  (ZITHROMAX ) 500 mg in sodium chloride  0.9 % 250 mL IVPB  Status:  Discontinued        500 mg 250 mL/hr over 60 Minutes Intravenous Every 24 hours 09/25/23 2327 09/26/23 1726   09/25/23 1415  vancomycin  (VANCOREADY) IVPB 1750 mg/350 mL        1,750 mg 175 mL/hr over 120 Minutes Intravenous  Once 09/25/23 1402 09/25/23 1854   09/25/23 1400  ceFEPIme  (MAXIPIME ) 2 g in sodium chloride  0.9 % 100 mL IVPB        2 g 200 mL/hr over 30 Minutes Intravenous  Once 09/25/23 1349 09/25/23  1459   09/25/23 1400  metroNIDAZOLE  (FLAGYL ) IVPB 500 mg        500 mg 100 mL/hr over 60 Minutes Intravenous  Once 09/25/23 1349 09/25/23 1854   09/25/23 1400  vancomycin  (VANCOCIN ) IVPB 1000 mg/200 mL premix  Status:  Discontinued        1,000 mg 200 mL/hr over 60 Minutes Intravenous  Once 09/25/23 1349 09/25/23 1357        Medications  Scheduled Meds:  sodium chloride    Intravenous Once   acetaminophen  (TYLENOL ) oral liquid 160 mg/5 mL  650 mg Per Tube Q6H   amoxicillin -clavulanate  1 tablet Per Tube Q12H   Followed by   Cecily Cohen ON 10/17/2023] amoxicillin -clavulanate  1 tablet Oral BID   carbamide peroxide  5 drop Both EARS BID   Chlorhexidine  Gluconate Cloth  6 each Topical Daily   feeding supplement (PROSource TF20)  60 mL Per Tube Daily   fentaNYL   1 patch Transdermal Q48H   gabapentin   300 mg Per Tube Q12H   guaiFENesin -dextromethorphan   10 mL Per Tube Q6H   insulin  aspart  0-9 Units Subcutaneous Q4H   insulin  aspart  2 Units Subcutaneous Q4H   insulin  glargine-yfgn  5 Units Subcutaneous Daily   lactobacillus  1 g Per Tube TID WC   mouth rinse  15 mL Mouth Rinse 4 times per day   pantoprazole  (PROTONIX ) IV  40 mg Intravenous Daily   polyethylene glycol  17 g Per Tube BID   senna  1 tablet Per Tube Daily   sodium chloride  flush  10-40 mL Intracatheter Q12H   Continuous Infusions:  feeding supplement (OSMOLITE 1.5 CAL) 80 mL/hr at 10/08/23 0813   PRN Meds:.morphine  injection, [DISCONTINUED] ondansetron  **OR** ondansetron  (ZOFRAN ) IV, mouth rinse, oxyCODONE , sodium chloride  flush    Subjective:   Jesus [name] was seen and examined today.   No new complaints.   Objective:   Vitals:   10/08/23 0715 10/08/23 0849 10/08/23 1115 10/08/23 1605  BP:  114/77    Pulse: 90 89 95 88  Resp: 20  20 20   Temp:  97.7 F (36.5 C)  TempSrc:      SpO2: 96% 100% 94% 95%  Weight:      Height:        Intake/Output Summary (Last 24 hours) at 10/08/2023 1657 Last data filed at  10/08/2023 0813 Gross per 24 hour  Intake 2722 ml  Output 650 ml  Net 2072 ml   Filed Weights   10/06/23 0503 10/07/23 0602 10/08/23 0431  Weight: 103.9 kg 104.3 kg 100.3 kg     Exam General exam: Appears calm and comfortable  Respiratory system: Clear to auscultation. Respiratory effort normal.s/p trach on oxygen.  Cardiovascular system: S1 & S2 heard, RRR. No JVD,  Gastrointestinal system: Abdomen is nondistended, soft and nontender.  Central nervous system: Alert and oriented.  Extremities:  no pedal edema.  Skin: No rashes,  Psychiatry: Mood & affect appropriate.       Data Reviewed:  I have personally reviewed following labs and imaging studies   CBC Lab Results  Component Value Date   WBC 11.1 (H) 10/07/2023   RBC 3.19 (L) 10/07/2023   HGB 8.6 (L) 10/07/2023   HCT 28.4 (L) 10/07/2023   MCV 89.0 10/07/2023   MCH 27.0 10/07/2023   PLT 730 (H) 10/07/2023   MCHC 30.3 10/07/2023   RDW 18.3 (H) 10/07/2023   LYMPHSABS 2.0 10/07/2023   MONOABS 0.9 10/07/2023   EOSABS 0.1 10/07/2023   BASOSABS 0.0 10/07/2023     Last metabolic panel Lab Results  Component Value Date   NA 133 (L) 10/07/2023   K 5.9 (H) 10/07/2023   CL 96 (L) 10/07/2023   CO2 29 10/07/2023   BUN 28 (H) 10/07/2023   CREATININE 0.95 10/07/2023   GLUCOSE 152 (H) 10/07/2023   GFRNONAA >60 10/07/2023   GFRAA >60 11/28/2019   CALCIUM  8.9 10/07/2023   PHOS 4.1 09/27/2023   PROT 6.5 09/27/2023   ALBUMIN 1.9 (L) 09/27/2023   LABGLOB 2.3 08/21/2019   AGRATIO 2.0 08/21/2019   BILITOT 0.7 09/27/2023   ALKPHOS 120 09/27/2023   AST 11 (L) 09/27/2023   ALT 11 09/27/2023   ANIONGAP 8 10/07/2023    CBG (last 3)  Recent Labs    10/08/23 0851 10/08/23 1215 10/08/23 1612  GLUCAP 167* 160* 151*      Coagulation Profile: No results for input(s): "INR", "PROTIME" in the last 168 hours.   Radiology Studies: No results found.      Feliciana Horn M.D. [facility] 10/08/2023, 4:57  PM  Available via Epic secure chat 7am-7pm After 7 pm, please refer to night coverage provider listed on amion.

## 2023-10-08 NOTE — Plan of Care (Signed)
  Problem: Education: Goal: Knowledge of General Education information will improve Description: Including pain rating scale, medication(s)/side effects and non-pharmacologic comfort measures Outcome: Progressing   Problem: Clinical Measurements: Goal: Ability to maintain clinical measurements within normal limits will improve Outcome: Progressing Goal: Respiratory complications will improve Outcome: Progressing   Problem: Metabolic: Goal: Ability to maintain appropriate glucose levels will improve Outcome: Progressing   Problem: Education: Goal: Knowledge about tracheostomy care/management will improve Outcome: Progressing   Problem: Activity: Goal: Ability to tolerate increased activity will improve Outcome: Progressing   Problem: Health Behavior/Discharge Planning: Goal: Ability to manage tracheostomy will improve Outcome: Progressing   Problem: Respiratory: Goal: Patent airway maintenance will improve Outcome: Progressing   Problem: Role Relationship: Goal: Ability to communicate will improve Outcome: Progressing

## 2023-10-08 DEATH — deceased

## 2023-10-09 ENCOUNTER — Inpatient Hospital Stay: Payer: PPO

## 2023-10-09 ENCOUNTER — Inpatient Hospital Stay: Payer: PPO | Admitting: Hematology

## 2023-10-09 DIAGNOSIS — J189 Pneumonia, unspecified organism: Secondary | ICD-10-CM | POA: Diagnosis not present

## 2023-10-09 DIAGNOSIS — C8338 Diffuse large B-cell lymphoma, lymph nodes of multiple sites: Secondary | ICD-10-CM | POA: Diagnosis not present

## 2023-10-09 DIAGNOSIS — D638 Anemia in other chronic diseases classified elsewhere: Secondary | ICD-10-CM | POA: Diagnosis not present

## 2023-10-09 DIAGNOSIS — I10 Essential (primary) hypertension: Secondary | ICD-10-CM | POA: Diagnosis not present

## 2023-10-09 LAB — GLUCOSE, CAPILLARY
Glucose-Capillary: 106 mg/dL — ABNORMAL HIGH (ref 70–99)
Glucose-Capillary: 139 mg/dL — ABNORMAL HIGH (ref 70–99)
Glucose-Capillary: 156 mg/dL — ABNORMAL HIGH (ref 70–99)
Glucose-Capillary: 176 mg/dL — ABNORMAL HIGH (ref 70–99)
Glucose-Capillary: 178 mg/dL — ABNORMAL HIGH (ref 70–99)
Glucose-Capillary: 181 mg/dL — ABNORMAL HIGH (ref 70–99)
Glucose-Capillary: 185 mg/dL — ABNORMAL HIGH (ref 70–99)

## 2023-10-09 MED ORDER — FUROSEMIDE 10 MG/ML IJ SOLN
40.0000 mg | Freq: Once | INTRAMUSCULAR | Status: AC
  Administered 2023-10-09: 40 mg via INTRAVENOUS
  Filled 2023-10-09: qty 4

## 2023-10-09 MED ORDER — SODIUM ZIRCONIUM CYCLOSILICATE 10 G PO PACK
10.0000 g | PACK | Freq: Two times a day (BID) | ORAL | Status: AC
  Administered 2023-10-09 – 2023-10-10 (×2): 10 g via ORAL
  Filled 2023-10-09 (×2): qty 1

## 2023-10-09 NOTE — TOC Progression Note (Signed)
 Transition of Care [facility]) - Progression Note    Patient Details  Name: Jesus [name] MRN: [id number] Date of Birth: 1956/02/13  [facility]) CM/SW Contact  Dane Dung, RN Phone Number: 10/09/2023, 1:01 PM  Clinical Narrative:    CM called and spoke with Deborha Falls, CM with [facility] and patient was declined by insurance provider for LTAC.  I called and left a voicemail message with the patient's daughter, Russella Courts by phone to discuss patient's SNF placement needs.   Expected Discharge Plan: Skilled Nursing Facility Barriers to Discharge: Continued Medical Work up  Expected Discharge Plan and Services In-house Referral: Clinical Social Work   Post Acute Care Choice: Skilled Nursing Facility Living arrangements for the past 2 months: Single Family Home, Skilled Nursing Facility                                       Social Determinants of Health (SDOH) Interventions SDOH Screenings   Food Insecurity: Food Insecurity Present (09/25/2023)  Housing: High Risk (09/25/2023)  Transportation Needs: No Transportation Needs (09/25/2023)  Recent Concern: Transportation Needs - Unmet Transportation Needs (07/29/2023)  Utilities: Not At Risk (09/25/2023)  Depression (PHQ2-9): Low Risk  (11/09/2022)  Financial Resource Strain: Low Risk  (10/06/2020)  Social Connections: Moderately Isolated (09/25/2023)  Tobacco Use: Medium Risk (09/25/2023)    Readmission Risk Interventions    07/31/2023    4:28 PM 07/17/2023    3:08 PM 07/05/2023   11:49 AM  Readmission Risk Prevention Plan  Transportation Screening Complete Complete Complete  Medication Review Oceanographer) Complete Complete Complete  PCP or Specialist appointment within 3-5 days of discharge Complete Complete Complete  HRI or Home Care Consult Complete Complete Complete  SW Recovery Care/Counseling Consult Complete Complete Complete  Palliative Care Screening Not Applicable Not  Applicable Not Applicable  Skilled Nursing Facility Not Applicable Not Applicable Not Applicable

## 2023-10-09 NOTE — Progress Notes (Signed)
 Inpatient Rehab Admissions Coordinator:   Per therapy recommendations,  patient was screened for CIR candidacy by Wandalee Gust, MS, CCC-SLP. At this time, pt. Is supervision with transfers and contact guard with ambulation. I do not think that he demonstrates functional deficits to necessitate CIR admit. I will not pursue CIR consult at this time.    Rehab Admissions Coordinator  [phone] (celll) [phone] (office)

## 2023-10-09 NOTE — Progress Notes (Signed)
 Physical Therapy Treatment Patient Details Name: Jesus [name] MRN: [id number] DOB: 1956-01-20 Today's Date: 10/09/2023   History of Present Illness 68 yo male adm to [facility] from the cancer center 09/25/23 for hypoxia and tachycardia. Pt with PNA, sepsis, trach esophageal fistula. Transferred to [facility] 09/26/23. Pt from [facility] where he has been undergoing rehab after hospitalization and emergent trach at [facility] on 08/03/23. PMH - trach, laryngeal CA, G tube, lymphoma, HTN, COPD, CHF    PT Comments  Pt pleasant, flat affect but able to communicate when he needs suction, tolerance for activity and performing increased sit to stand and gait trials this session on 30% trach collar. Pt remains with sore and sensitive trach particularly with transition to adapter for gait. Pt encouraged to be OOB, continue HEp and standing trials. Pt reports he did not like being at [facility] and hopes to not have to return there.  Pt with significant improvement from prior sessions and reports desire for AIR which is feasible if family able to provide assist (none currently present). Will continue to follow to maximize function and gait tolerance.    If plan is discharge home, recommend the following: A little help with walking and/or transfers;A little help with bathing/dressing/bathroom;Assistance with cooking/housework;Assist for transportation   Can travel by private vehicle        Equipment Recommendations  None recommended by PT    Recommendations for Other Services       Precautions / Restrictions Precautions Precautions: Fall;Other (comment) Recall of Precautions/Restrictions: Intact Precaution/Restrictions Comments: trach (trach is very sensitive be careful when moving it or placing him on O2), g tube     Mobility  Bed Mobility               General bed mobility comments: pt sitting EOB on arrival with HOB 55 degrees    Transfers Overall transfer level: Needs assistance   Transfers: Sit to/from  Stand Sit to Stand: Supervision           General transfer comment: supervision for lines with pt able to stand from EOB x 2 and from chair x 6 repeated trials with reliance on UB on armrests. Fatigued after 6 repeated trials    Ambulation/Gait Ambulation/Gait assistance: Contact guard assist Gait Distance (Feet): 100 Feet Assistive device: Rolling walker (2 wheels) Gait Pattern/deviations: Step-through pattern, Decreased stride length   Gait velocity interpretation: 1.31 - 2.62 ft/sec, indicative of limited community ambulator   General Gait Details: pt self directing distance, chair followed due to prior knee buckling but no instances this session.   Stairs             Wheelchair Mobility     Tilt Bed    Modified Rankin (Stroke Patients Only)       Balance Overall balance assessment: Needs assistance Sitting-balance support: No upper extremity supported, Feet supported Sitting balance-Leahy Scale: Good     Standing balance support: Bilateral upper extremity supported, Reliant on assistive device for balance, During functional activity Standing balance-Leahy Scale: Poor Standing balance comment: RW in standing                            Communication Communication Communication: Impaired Factors Affecting Communication: Trach/intubated  Cognition Arousal: Alert Behavior During Therapy: Flat affect   PT - Cognitive impairments: No apparent impairments, Difficult to assess Difficult to assess due to: Impaired communication, Tracheostomy  PT - Cognition Comments: pt utilizing texting on cell phone to communicate along with gestures. pt following commands appropriately Following commands: Intact      Cueing Cueing Techniques: Verbal cues, Gestural cues  Exercises      General Comments        Pertinent Vitals/Pain Pain Assessment Pain Score: 3  Pain Location: throat, neck Pain Descriptors / Indicators:  Grimacing, Guarding, Tender Pain Intervention(s): Limited activity within patient's tolerance, Monitored during session, Repositioned    Home Living                          Prior Function            PT Goals (current goals can now be found in the care plan section) Progress towards PT goals: Progressing toward goals    Frequency    Min 2X/week      PT Plan      Co-evaluation              AM-PAC PT "6 Clicks" Mobility   Outcome Measure  Help needed turning from your back to your side while in a flat bed without using bedrails?: None Help needed moving from lying on your back to sitting on the side of a flat bed without using bedrails?: None Help needed moving to and from a bed to a chair (including a wheelchair)?: A Little Help needed standing up from a chair using your arms (e.g., wheelchair or bedside chair)?: A Little Help needed to walk in hospital room?: A Little Help needed climbing 3-5 steps with a railing? : A Lot 6 Click Score: 19    End of Session Equipment Utilized During Treatment: Oxygen Activity Tolerance: Patient tolerated treatment well Patient left: in chair;with call bell/phone within reach;with nursing/sitter in room Nurse Communication: Mobility status PT Visit Diagnosis: Other abnormalities of gait and mobility (R26.89);Muscle weakness (generalized) (M62.81);Pain     Time: 1610-9604 PT Time Calculation (min) (ACUTE ONLY): 33 min  Charges:    $Gait Training: 8-22 mins $Therapeutic Activity: 8-22 mins PT General Charges $$ ACUTE PT VISIT: 1 Visit                     Annis Baseman, PT Acute Rehabilitation Services Office: [phone]    Jackey Mary Vinnie Bobst 10/09/2023, 12:47 PM

## 2023-10-09 NOTE — Progress Notes (Signed)
 [facility]                                                                               Jesus [name], is a 68 y.o. male, DOB - 01-29-1956, WGN:[id number] Admit date - 09/25/2023    Outpatient Primary MD for the patient is Jesus Congo, MD  LOS - 14  days    Brief summary   Jesus [name] is an 68 y.o. male past medical history of diffuse large B-cell lymphoma, metastatic laryngeal squamous carcinoma peritracheal mass status post tracheostomy on 08/03/2023 by thoracic surgery at [facility], essential hypertension chronic diastolic dysfunction anemia of chronic disease transferred from [facility] due to respiratory distress upon arrival was found tachycardic tachypneic and requiring 6 L of oxygen had a temperature of 102, with purulent tracheal secretions white count of 12 admitted on the [facility] service for sepsis secondary to pneumonia transferred to PCCM on 09/26/2023 CT scan of the neck revealed extensive debris encompassing the tracheostomy affected by tumor and necrotic tumor with trach esophageal fistula.  Transfer back to [facility] 10/01/2023 on empiric antibiotics he is to complete a 7-day course of IV Unasyn  followed by 14 days of Augmentin  for suppressive therapy.    Assessment & Plan    Assessment and Plan:    Sepsis secondary to Multifocal pneumonia/acute respiratory failure with hypoxia secondary to malignancy: He completed 7-day course of IV Unasyn , now will continue 14-day course of Augmentin  for suppressive therapy. PCCM and ENT, relate infection may lead to destructive airway compromise and tumor will continue to progress without chemotherapy which she is not a candidate to receive due to his infection. 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 Follow-up with oncology as an outpatient. Oncology evaluated the patient on this admission and recommended moving towards  hospice care. Palliative care met with family and patient, they decided to continue aggressive treatment, he is a full code.  Patient with increased secretions, Robitussin added.  CXR showing Improving bibasilar aeration with residual streaky opacities in both lung bases. He wanted to go to skilled nursing facility or LTAC.    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HCC)/with paratracheal mass status post tracheostomy on 08/03/2023 at [facility] / metastatic glandular squamous cell carcinoma: Oncology was consulted he was previously seen by Dr. Elvan Hamel and Dr. Teofilo Fellers who did not feel intervention were possible he was seen at [facility] and was started on carboplatin  and Taxol. ENT was involved in the care and there is no good surgical repair option nor good options to replace the current tracheostomy tube without risking airway loss though he is not a candidate for surgical intervention. Not  a candidate for treatment at this time.  Palliative care was consulted and family and patient wants to continue aggressive care. Physicians have explained to the patient the severity of the case and they have unrealistic expectations. the patient understands the situation.   Anemia of chronic disease: Hemoglobin was 6.9. Transfused 1 unit of prbc transfusion. Repeat H& H is 8.1 and 8.3 and stable.  Likely multifactorial secondary to malignancy and poor  nutrition. Transfuse to keep hemoglobin greater than 7.      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   Thrombocytosis: Likely reactive.  They are improving slowly.   Essential hypertension Well controlled.    Hyperlipidemia Stable.   Chronic diastolic CHF.  CXR showing Possible small pleural effusions.     Hyponatremia:  Suspect from fluid overload.  One dose of IV lasix  40 mg today. Check bmp in the morning.    Nutrition:  On tube feeds. G-tube inserted on 06/21/2023.   Clogged ears:  - dubrox ear drops.    Type 2 DM  with hyperglycemia.  Last A1c is 8.9, recheck A1c is 6.6%.  CBG (last 3)  Recent Labs    10/09/23 0746 10/09/23 1217 10/09/23 1636  GLUCAP 178* 156* 181*   Resume Semglee  5 units and SSI.   Hyperkalemia Improved from 5.9 to 5.5. Lokelma  ordered.  Increased miralax  to BID.  Repeat BMP in the morning.     Disposition:  Insurance denied LTAC. Patient appealing. Toc on board.  Possible SNF   RN Pressure Injury Documentation: Pressure Injury 09/25/23 Coccyx Stage 1 -  Intact skin with non-blanchable redness of a localized area usually over a bony prominence. in gluteal cleft and bottom of sacrum/coccyx (Active)  09/25/23 2150  Location: Coccyx  Location Orientation:   Staging: Stage 1 -  Intact skin with non-blanchable redness of a localized area usually over a bony prominence.  Wound Description (Comments): in gluteal cleft and bottom of sacrum/coccyx  Present on Admission: Yes  Dressing Type Foam - Lift dressing to assess site every shift 10/07/23 2000   Local wound care.  Malnutrition Type:  Nutrition Problem: Moderate Malnutrition Etiology: chronic illness, cancer and cancer related treatments   Malnutrition Characteristics:  Signs/Symptoms: mild fat depletion, mild muscle depletion, percent weight loss   Nutrition Interventions:  Interventions: Tube feeding, Prostat  Estimated body mass index is 30.26 kg/m as calculated from the following:   Height as of this encounter: 6' (1.829 m).   Weight as of this encounter: 101.2 kg.  Code Status: full code.  DVT Prophylaxis:  Place and maintain sequential compression device Start: 10/04/23 0459 Place TED hose Start: 10/04/23 0459   Level of Care: Level of care: Med-Surg Family Communication: Updated patient's family at bedside.   Disposition Plan:     Remains inpatient appropriate:  placement.   Procedures:  None.   Consultants:   Palliative care.   Antimicrobials:   Anti-infectives (From admission,  onward)    Start     Dose/Rate Route Frequency Ordered Stop   10/17/23 1000  amoxicillin -clavulanate (AUGMENTIN ) 500-125 MG per tablet 1 tablet       Placed in "Followed by" Linked Group   1 tablet Oral 2 times daily 09/29/23 1152     10/03/23 1000  amoxicillin -clavulanate (AUGMENTIN ) 875-125 MG per tablet 1 tablet       Placed in "Followed by" Linked Group   1 tablet Per Tube Every 12 hours 09/29/23 1152 10/17/23 0959   09/29/23 1200  Ampicillin -Sulbactam (UNASYN ) 3 g in sodium chloride  0.9 % 100 mL IVPB        3 g 200 mL/hr over 30 Minutes Intravenous Every 6 hours 09/29/23 1100 10/02/23 1824   09/28/23 1800  Vancomycin  (VANCOCIN ) 1,250 mg in sodium chloride  0.9 % 250 mL IVPB  Status:  Discontinued        1,250 mg 166.7 mL/hr over 90 Minutes Intravenous Every 12 hours 09/28/23 1009 09/29/23 1058  09/27/23 0600  vancomycin  (VANCOREADY) IVPB 1500 mg/300 mL  Status:  Discontinued        1,500 mg 150 mL/hr over 120 Minutes Intravenous Every 12 hours 09/26/23 1747 09/28/23 1009   09/26/23 1845  vancomycin  (VANCOREADY) IVPB 2000 mg/400 mL        2,000 mg 200 mL/hr over 120 Minutes Intravenous  Once 09/26/23 1747 09/26/23 2053   09/26/23 1800  piperacillin -tazobactam (ZOSYN ) IVPB 3.375 g  Status:  Discontinued        3.375 g 12.5 mL/hr over 240 Minutes Intravenous Every 8 hours 09/26/23 1747 09/29/23 1058   09/26/23 0000  cefTRIAXone  (ROCEPHIN ) 2 g in sodium chloride  0.9 % 100 mL IVPB  Status:  Discontinued        2 g 200 mL/hr over 30 Minutes Intravenous Every 24 hours 09/25/23 2327 09/26/23 1726   09/26/23 0000  azithromycin  (ZITHROMAX ) 500 mg in sodium chloride  0.9 % 250 mL IVPB  Status:  Discontinued        500 mg 250 mL/hr over 60 Minutes Intravenous Every 24 hours 09/25/23 2327 09/26/23 1726   09/25/23 1415  vancomycin  (VANCOREADY) IVPB 1750 mg/350 mL        1,750 mg 175 mL/hr over 120 Minutes Intravenous  Once 09/25/23 1402 09/25/23 1854   09/25/23 1400  ceFEPIme  (MAXIPIME ) 2 g in  sodium chloride  0.9 % 100 mL IVPB        2 g 200 mL/hr over 30 Minutes Intravenous  Once 09/25/23 1349 09/25/23 1459   09/25/23 1400  metroNIDAZOLE  (FLAGYL ) IVPB 500 mg        500 mg 100 mL/hr over 60 Minutes Intravenous  Once 09/25/23 1349 09/25/23 1854   09/25/23 1400  vancomycin  (VANCOCIN ) IVPB 1000 mg/200 mL premix  Status:  Discontinued        1,000 mg 200 mL/hr over 60 Minutes Intravenous  Once 09/25/23 1349 09/25/23 1357        Medications  Scheduled Meds:  sodium chloride    Intravenous Once   acetaminophen  (TYLENOL ) oral liquid 160 mg/5 mL  650 mg Per Tube Q6H   amoxicillin -clavulanate  1 tablet Per Tube Q12H   Followed by   Cecily Cohen ON 10/17/2023] amoxicillin -clavulanate  1 tablet Oral BID   carbamide peroxide  5 drop Both EARS BID   Chlorhexidine  Gluconate Cloth  6 each Topical Daily   feeding supplement (PROSource TF20)  60 mL Per Tube Daily   fentaNYL   1 patch Transdermal Q48H   gabapentin   300 mg Per Tube Q12H   guaiFENesin -dextromethorphan   10 mL Per Tube Q6H   insulin  aspart  0-9 Units Subcutaneous Q4H   insulin  aspart  2 Units Subcutaneous Q4H   insulin  glargine-yfgn  5 Units Subcutaneous Daily   lactobacillus  1 g Per Tube TID WC   mouth rinse  15 mL Mouth Rinse 4 times per day   pantoprazole  (PROTONIX ) IV  40 mg Intravenous Daily   polyethylene glycol  17 g Per Tube BID   senna  1 tablet Per Tube Daily   sodium chloride  flush  10-40 mL Intracatheter Q12H   sodium zirconium cyclosilicate   10 g Oral BID   Continuous Infusions:  feeding supplement (OSMOLITE 1.5 CAL) 80 mL/hr at 10/09/23 0600   PRN Meds:.morphine  injection, [DISCONTINUED] ondansetron  **OR** ondansetron  (ZOFRAN ) IV, mouth rinse, oxyCODONE , sodium chloride  flush    Subjective:   Mcarthur Ivins was seen and examined today.  Continues to have increased secretions.  No fever or chills.  Objective:   Vitals:   10/09/23 0745 10/09/23 1242 10/09/23 1249 10/09/23 1633  BP: 128/75   137/84   Pulse: 88  85 (!) 101  Resp:   18   Temp: 98 F (36.7 C)   98.3 F (36.8 C)  TempSrc:      SpO2: 100% 93% 90% 100%  Weight:      Height:        Intake/Output Summary (Last 24 hours) at 10/09/2023 1651 Last data filed at 10/09/2023 0600 Gross per 24 hour  Intake 1478 ml  Output 350 ml  Net 1128 ml   Filed Weights   10/07/23 0602 10/08/23 0431 10/09/23 0702  Weight: 104.3 kg 100.3 kg 101.2 kg     Exam General exam: Appears calm and comfortable  Respiratory system: diminished air entry at bases. No wheezing heard s/p trach on oxygen.  Cardiovascular system: S1 & S2 heard, RRR. No JVD, Gastrointestinal system: Abdomen is nondistended, soft and nontender.  Central nervous system: Alert and oriented .  Extremities: warm extremities.  Skin: No rashes,  Psychiatry: calm and comfortable.        Data Reviewed:  I have personally reviewed following labs and imaging studies   CBC Lab Results  Component Value Date   WBC 11.1 (H) 10/07/2023   RBC 3.19 (L) 10/07/2023   HGB 8.8 (L) 10/08/2023   HCT 28.1 (L) 10/08/2023   MCV 89.0 10/07/2023   MCH 27.0 10/07/2023   PLT 730 (H) 10/07/2023   MCHC 30.3 10/07/2023   RDW 18.3 (H) 10/07/2023   LYMPHSABS 2.0 10/07/2023   MONOABS 0.9 10/07/2023   EOSABS 0.1 10/07/2023   BASOSABS 0.0 10/07/2023     Last metabolic panel Lab Results  Component Value Date   NA 129 (L) 10/08/2023   K 5.5 (H) 10/08/2023   CL 93 (L) 10/08/2023   CO2 26 10/08/2023   BUN 36 (H) 10/08/2023   CREATININE 1.07 10/08/2023   GLUCOSE 189 (H) 10/08/2023   GFRNONAA >60 10/08/2023   GFRAA >60 11/28/2019   CALCIUM  9.0 10/08/2023   PHOS 4.1 09/27/2023   PROT 6.5 09/27/2023   ALBUMIN 1.9 (L) 09/27/2023   LABGLOB 2.3 08/21/2019   AGRATIO 2.0 08/21/2019   BILITOT 0.7 09/27/2023   ALKPHOS 120 09/27/2023   AST 11 (L) 09/27/2023   ALT 11 09/27/2023   ANIONGAP 10 10/08/2023    CBG (last 3)  Recent Labs    10/09/23 0746 10/09/23 1217 10/09/23 1636   GLUCAP 178* 156* 181*      Coagulation Profile: No results for input(s): "INR", "PROTIME" in the last 168 hours.   Radiology Studies: No results found.      Feliciana Horn M.D. [facility] 10/09/2023, 4:51 PM  Available via Epic secure chat 7am-7pm After 7 pm, please refer to night coverage provider listed on amion.

## 2023-10-09 NOTE — Plan of Care (Signed)
  Problem: Education: Goal: Knowledge of General Education information will improve Description: Including pain rating scale, medication(s)/side effects and non-pharmacologic comfort measures Outcome: Progressing   Problem: Health Behavior/Discharge Planning: Goal: Ability to manage health-related needs will improve Outcome: Progressing   Problem: Clinical Measurements: Goal: Ability to maintain clinical measurements within normal limits will improve Outcome: Progressing Goal: Will remain free from infection Outcome: Progressing Goal: Diagnostic test results will improve Outcome: Progressing Goal: Respiratory complications will improve Outcome: Progressing Goal: Cardiovascular complication will be avoided Outcome: Progressing   Problem: Activity: Goal: Risk for activity intolerance will decrease Outcome: Progressing   Problem: Nutrition: Goal: Adequate nutrition will be maintained Outcome: Progressing   Problem: Coping: Goal: Level of anxiety will decrease Outcome: Progressing   Problem: Elimination: Goal: Will not experience complications related to bowel motility Outcome: Progressing Goal: Will not experience complications related to urinary retention Outcome: Progressing   Problem: Pain Managment: Goal: General experience of comfort will improve and/or be controlled Outcome: Progressing   Problem: Safety: Goal: Ability to remain free from injury will improve Outcome: Progressing   Problem: Skin Integrity: Goal: Risk for impaired skin integrity will decrease Outcome: Progressing   Problem: Education: Goal: Ability to describe self-care measures that may prevent or decrease complications (Diabetes Survival Skills Education) will improve Outcome: Progressing   Problem: Coping: Goal: Ability to adjust to condition or change in health will improve Outcome: Progressing   Problem: Fluid Volume: Goal: Ability to maintain a balanced intake and output will  improve Outcome: Progressing   Problem: Health Behavior/Discharge Planning: Goal: Ability to identify and utilize available resources and services will improve Outcome: Progressing Goal: Ability to manage health-related needs will improve Outcome: Progressing   Problem: Metabolic: Goal: Ability to maintain appropriate glucose levels will improve Outcome: Progressing   Problem: Nutritional: Goal: Maintenance of adequate nutrition will improve Outcome: Progressing Goal: Progress toward achieving an optimal weight will improve Outcome: Progressing   Problem: Skin Integrity: Goal: Risk for impaired skin integrity will decrease Outcome: Progressing   Problem: Tissue Perfusion: Goal: Adequacy of tissue perfusion will improve Outcome: Progressing   Problem: Fluid Volume: Goal: Hemodynamic stability will improve Outcome: Progressing   Problem: Clinical Measurements: Goal: Diagnostic test results will improve Outcome: Progressing Goal: Signs and symptoms of infection will decrease Outcome: Progressing   Problem: Respiratory: Goal: Ability to maintain adequate ventilation will improve Outcome: Progressing   Problem: Education: Goal: Knowledge about tracheostomy care/management will improve Outcome: Progressing   Problem: Activity: Goal: Ability to tolerate increased activity will improve Outcome: Progressing   Problem: Health Behavior/Discharge Planning: Goal: Ability to manage tracheostomy will improve Outcome: Progressing   Problem: Respiratory: Goal: Patent airway maintenance will improve Outcome: Progressing   Problem: Role Relationship: Goal: Ability to communicate will improve Outcome: Progressing

## 2023-10-10 DIAGNOSIS — J189 Pneumonia, unspecified organism: Secondary | ICD-10-CM | POA: Diagnosis not present

## 2023-10-10 DIAGNOSIS — D638 Anemia in other chronic diseases classified elsewhere: Secondary | ICD-10-CM | POA: Diagnosis not present

## 2023-10-10 DIAGNOSIS — I10 Essential (primary) hypertension: Secondary | ICD-10-CM | POA: Diagnosis not present

## 2023-10-10 DIAGNOSIS — C8338 Diffuse large B-cell lymphoma, lymph nodes of multiple sites: Secondary | ICD-10-CM | POA: Diagnosis not present

## 2023-10-10 LAB — BASIC METABOLIC PANEL WITH GFR
Anion gap: 13 (ref 5–15)
BUN: 47 mg/dL — ABNORMAL HIGH (ref 8–23)
CO2: 28 mmol/L (ref 22–32)
Calcium: 9.3 mg/dL (ref 8.9–10.3)
Chloride: 93 mmol/L — ABNORMAL LOW (ref 98–111)
Creatinine, Ser: 1.06 mg/dL (ref 0.61–1.24)
GFR, Estimated: >60 mL/min (ref >60)
Glucose, Bld: 181 mg/dL — ABNORMAL HIGH (ref 70–99)
Potassium: 5.1 mmol/L (ref 3.5–5.1)
Sodium: 134 mmol/L — ABNORMAL LOW (ref 135–145)

## 2023-10-10 LAB — GLUCOSE, CAPILLARY
Glucose-Capillary: 126 mg/dL — ABNORMAL HIGH (ref 70–99)
Glucose-Capillary: 151 mg/dL — ABNORMAL HIGH (ref 70–99)
Glucose-Capillary: 170 mg/dL — ABNORMAL HIGH (ref 70–99)
Glucose-Capillary: 177 mg/dL — ABNORMAL HIGH (ref 70–99)
Glucose-Capillary: 192 mg/dL — ABNORMAL HIGH (ref 70–99)
Glucose-Capillary: 193 mg/dL — ABNORMAL HIGH (ref 70–99)
Glucose-Capillary: 193 mg/dL — ABNORMAL HIGH (ref 70–99)

## 2023-10-10 NOTE — Progress Notes (Signed)
 Pt due for a trach change 6/3. RT spoke with MD and MD states to ask pt what he wants to do first. Pt has stated that the area around his trach is very sore and sensitive to touch due to his lung cancer/mass. RT looked back on notes from MD from 6/2 and it states not to remove/change trach because of the risk of losing the pt airway. Trach change is not recommended at this time.

## 2023-10-10 NOTE — Plan of Care (Signed)
  Problem: Education: Goal: Knowledge of General Education information will improve Description: Including pain rating scale, medication(s)/side effects and non-pharmacologic comfort measures Outcome: Progressing   Problem: Health Behavior/Discharge Planning: Goal: Ability to manage health-related needs will improve Outcome: Progressing   Problem: Clinical Measurements: Goal: Ability to maintain clinical measurements within normal limits will improve Outcome: Progressing Goal: Will remain free from infection Outcome: Progressing Goal: Diagnostic test results will improve Outcome: Progressing Goal: Respiratory complications will improve Outcome: Progressing Goal: Cardiovascular complication will be avoided Outcome: Progressing   Problem: Activity: Goal: Risk for activity intolerance will decrease Outcome: Progressing   Problem: Nutrition: Goal: Adequate nutrition will be maintained Outcome: Progressing   Problem: Coping: Goal: Level of anxiety will decrease Outcome: Progressing   Problem: Elimination: Goal: Will not experience complications related to bowel motility Outcome: Progressing Goal: Will not experience complications related to urinary retention Outcome: Progressing   Problem: Pain Managment: Goal: General experience of comfort will improve and/or be controlled Outcome: Progressing   Problem: Safety: Goal: Ability to remain free from injury will improve Outcome: Progressing   Problem: Skin Integrity: Goal: Risk for impaired skin integrity will decrease Outcome: Progressing   Problem: Education: Goal: Ability to describe self-care measures that may prevent or decrease complications (Diabetes Survival Skills Education) will improve Outcome: Progressing   Problem: Coping: Goal: Ability to adjust to condition or change in health will improve Outcome: Progressing   Problem: Fluid Volume: Goal: Ability to maintain a balanced intake and output will  improve Outcome: Progressing   Problem: Health Behavior/Discharge Planning: Goal: Ability to identify and utilize available resources and services will improve Outcome: Progressing Goal: Ability to manage health-related needs will improve Outcome: Progressing   Problem: Metabolic: Goal: Ability to maintain appropriate glucose levels will improve Outcome: Progressing   Problem: Nutritional: Goal: Maintenance of adequate nutrition will improve Outcome: Progressing Goal: Progress toward achieving an optimal weight will improve Outcome: Progressing   Problem: Skin Integrity: Goal: Risk for impaired skin integrity will decrease Outcome: Progressing   Problem: Tissue Perfusion: Goal: Adequacy of tissue perfusion will improve Outcome: Progressing   Problem: Fluid Volume: Goal: Hemodynamic stability will improve Outcome: Progressing   Problem: Clinical Measurements: Goal: Diagnostic test results will improve Outcome: Progressing Goal: Signs and symptoms of infection will decrease Outcome: Progressing   Problem: Respiratory: Goal: Ability to maintain adequate ventilation will improve Outcome: Progressing   Problem: Education: Goal: Knowledge about tracheostomy care/management will improve Outcome: Progressing   Problem: Activity: Goal: Ability to tolerate increased activity will improve Outcome: Progressing   Problem: Health Behavior/Discharge Planning: Goal: Ability to manage tracheostomy will improve Outcome: Progressing   Problem: Respiratory: Goal: Patent airway maintenance will improve Outcome: Progressing   Problem: Role Relationship: Goal: Ability to communicate will improve Outcome: Progressing

## 2023-10-10 NOTE — Progress Notes (Signed)
 [facility]                                                                               Jesus [name], is a 68 y.o. male, DOB - 1955/11/19, ZOX:[id number] Admit date - 09/25/2023    Outpatient Primary MD for the patient is Jesus Congo, MD  LOS - 15  days    Brief summary   Jesus [name] is an 68 y.o. male past medical history of diffuse large B-cell lymphoma, metastatic laryngeal squamous carcinoma peritracheal mass status post tracheostomy on 08/03/2023 by thoracic surgery at [facility], essential hypertension chronic diastolic dysfunction anemia of chronic disease transferred from [facility] due to respiratory distress upon arrival was found tachycardic tachypneic and requiring 6 L of oxygen had a temperature of 102, with purulent tracheal secretions white count of 12 admitted on the [facility] service for sepsis secondary to pneumonia transferred to PCCM on 09/26/2023 CT scan of the neck revealed extensive debris encompassing the tracheostomy affected by tumor and necrotic tumor with trach esophageal fistula.  Transfer back to [facility] 10/01/2023 on empiric antibiotics he is to complete a 7-day course of IV Unasyn  followed by 14 days of Augmentin  for suppressive therapy.    Assessment & Plan    Assessment and Plan:    Sepsis secondary to Multifocal pneumonia/acute respiratory failure with hypoxia secondary to malignancy: He completed 7-day course of IV Unasyn , now will continue 14-day course of Augmentin  for suppressive therapy. PCCM and ENT, relate infection may lead to destructive airway compromise and tumor will continue to progress without chemotherapy which she is not a candidate to receive due to his infection. 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 Follow-up with oncology as an outpatient. Oncology evaluated the patient on this admission and recommended moving towards  hospice care. Palliative care met with family and patient, they decided to continue aggressive treatment, he is a full code. Patient with increased secretions, Robitussin added.  CXR showing Improving bibasilar aeration with residual streaky opacities in both lung bases. Patient was scheduled for trach change today on 6/3, but in view of his sensitivity due to malignancy and risk of losing of patient airway, trach change is not recommended at this time.  He wanted to go to skilled nursing facility or LTAC.    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HCC)/with paratracheal mass status post tracheostomy on 08/03/2023 at [facility] / metastatic glandular squamous cell carcinoma: Oncology was consulted he was previously seen by Dr. Elvan Hamel and Dr. Teofilo Fellers who did not feel intervention were possible he was seen at [facility] and was started on carboplatin  and Taxol. ENT was involved in the care and there is no good surgical repair option nor good options to replace the current tracheostomy tube without risking airway loss though he is not a candidate for surgical intervention. Not  a candidate for treatment at this time.  Palliative care was consulted and family and patient wants to continue aggressive care. Physicians have explained to the patient the severity of the case and they have unrealistic expectations. the patient understands the  situation.   Anemia of chronic disease: Hemoglobin was 6.9. Transfused 1 unit of prbc transfusion. Repeat H& H is 8.1 and 8.3 and stable.  Likely multifactorial secondary to malignancy and poor nutrition. Transfuse to keep hemoglobin greater than 7.      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   Thrombocytosis: Likely reactive.  They are improving slowly.   Essential hypertension Optimally controlled.    Hyperlipidemia Stable.   Chronic diastolic CHF.  CXR showing Possible small pleural effusions.     Hyponatremia:  Suspect from fluid  overload.  One dose of IV lasix  40 mg given with improvement in sodium.  Continue to monitor.    Nutrition:  On tube feeds. G-tube inserted on 06/21/2023.   Clogged ears:  - dubrox ear drops.    Type 2 DM with hyperglycemia.  Last A1c is 8.9, recheck A1c is 6.6%.  CBG (last 3)  Recent Labs    10/10/23 0739 10/10/23 1329 10/10/23 1725  GLUCAP 177* 126* 151*   Resume Semglee  5 units and SSI.   Hyperkalemia Unclear etiology, improved with lokelma .     Disposition:  Insurance denied LTAC.  Toc on board.  Possible SNF   RN Pressure Injury Documentation: Pressure Injury 09/25/23 Coccyx Stage 1 -  Intact skin with non-blanchable redness of a localized area usually over a bony prominence. in gluteal cleft and bottom of sacrum/coccyx (Active)  09/25/23 2150  Location: Coccyx  Location Orientation:   Staging: Stage 1 -  Intact skin with non-blanchable redness of a localized area usually over a bony prominence.  Wound Description (Comments): in gluteal cleft and bottom of sacrum/coccyx  Present on Admission: Yes  Dressing Type Foam - Lift dressing to assess site every shift 10/07/23 2000   Local wound care.  Malnutrition Type:  Nutrition Problem: Moderate Malnutrition Etiology: chronic illness, cancer and cancer related treatments   Malnutrition Characteristics:  Signs/Symptoms: mild fat depletion, mild muscle depletion, percent weight loss   Nutrition Interventions:  Interventions: Tube feeding, Prostat  Estimated body mass index is 30.32 kg/m as calculated from the following:   Height as of this encounter: 6' (1.829 m).   Weight as of this encounter: 101.4 kg.  Code Status: full code.  DVT Prophylaxis:  Place and maintain sequential compression device Start: 10/04/23 0459 Place TED hose Start: 10/04/23 0459   Level of Care: Level of care: Med-Surg Family Communication: Updated patient's family over the phone.   Disposition Plan:     Remains inpatient  appropriate:  placement.   Procedures:  None.   Consultants:   Palliative care.   Antimicrobials:   Anti-infectives (From admission, onward)    Start     Dose/Rate Route Frequency Ordered Stop   10/17/23 1000  amoxicillin -clavulanate (AUGMENTIN ) 500-125 MG per tablet 1 tablet       Placed in "Followed by" Linked Group   1 tablet Oral 2 times daily 09/29/23 1152     10/03/23 1000  amoxicillin -clavulanate (AUGMENTIN ) 875-125 MG per tablet 1 tablet       Placed in "Followed by" Linked Group   1 tablet Per Tube Every 12 hours 09/29/23 1152 10/17/23 0959   09/29/23 1200  Ampicillin -Sulbactam (UNASYN ) 3 g in sodium chloride  0.9 % 100 mL IVPB        3 g 200 mL/hr over 30 Minutes Intravenous Every 6 hours 09/29/23 1100 10/02/23 1824   09/28/23 1800  Vancomycin  (VANCOCIN ) 1,250 mg in sodium chloride  0.9 % 250 mL IVPB  Status:  Discontinued  1,250 mg 166.7 mL/hr over 90 Minutes Intravenous Every 12 hours 09/28/23 1009 09/29/23 1058   09/27/23 0600  vancomycin  (VANCOREADY) IVPB 1500 mg/300 mL  Status:  Discontinued        1,500 mg 150 mL/hr over 120 Minutes Intravenous Every 12 hours 09/26/23 1747 09/28/23 1009   09/26/23 1845  vancomycin  (VANCOREADY) IVPB 2000 mg/400 mL        2,000 mg 200 mL/hr over 120 Minutes Intravenous  Once 09/26/23 1747 09/26/23 2053   09/26/23 1800  piperacillin -tazobactam (ZOSYN ) IVPB 3.375 g  Status:  Discontinued        3.375 g 12.5 mL/hr over 240 Minutes Intravenous Every 8 hours 09/26/23 1747 09/29/23 1058   09/26/23 0000  cefTRIAXone  (ROCEPHIN ) 2 g in sodium chloride  0.9 % 100 mL IVPB  Status:  Discontinued        2 g 200 mL/hr over 30 Minutes Intravenous Every 24 hours 09/25/23 2327 09/26/23 1726   09/26/23 0000  azithromycin  (ZITHROMAX ) 500 mg in sodium chloride  0.9 % 250 mL IVPB  Status:  Discontinued        500 mg 250 mL/hr over 60 Minutes Intravenous Every 24 hours 09/25/23 2327 09/26/23 1726   09/25/23 1415  vancomycin  (VANCOREADY) IVPB 1750  mg/350 mL        1,750 mg 175 mL/hr over 120 Minutes Intravenous  Once 09/25/23 1402 09/25/23 1854   09/25/23 1400  ceFEPIme  (MAXIPIME ) 2 g in sodium chloride  0.9 % 100 mL IVPB        2 g 200 mL/hr over 30 Minutes Intravenous  Once 09/25/23 1349 09/25/23 1459   09/25/23 1400  metroNIDAZOLE  (FLAGYL ) IVPB 500 mg        500 mg 100 mL/hr over 60 Minutes Intravenous  Once 09/25/23 1349 09/25/23 1854   09/25/23 1400  vancomycin  (VANCOCIN ) IVPB 1000 mg/200 mL premix  Status:  Discontinued        1,000 mg 200 mL/hr over 60 Minutes Intravenous  Once 09/25/23 1349 09/25/23 1357        Medications  Scheduled Meds:  sodium chloride    Intravenous Once   acetaminophen  (TYLENOL ) oral liquid 160 mg/5 mL  650 mg Per Tube Q6H   amoxicillin -clavulanate  1 tablet Per Tube Q12H   Followed by   Cecily Cohen ON 10/17/2023] amoxicillin -clavulanate  1 tablet Oral BID   carbamide peroxide  5 drop Both EARS BID   Chlorhexidine  Gluconate Cloth  6 each Topical Daily   feeding supplement (PROSource TF20)  60 mL Per Tube Daily   fentaNYL   1 patch Transdermal Q48H   gabapentin   300 mg Per Tube Q12H   guaiFENesin -dextromethorphan   10 mL Per Tube Q6H   insulin  aspart  0-9 Units Subcutaneous Q4H   insulin  aspart  2 Units Subcutaneous Q4H   insulin  glargine-yfgn  5 Units Subcutaneous Daily   lactobacillus  1 g Per Tube TID WC   mouth rinse  15 mL Mouth Rinse 4 times per day   pantoprazole  (PROTONIX ) IV  40 mg Intravenous Daily   polyethylene glycol  17 g Per Tube BID   senna  1 tablet Per Tube Daily   sodium chloride  flush  10-40 mL Intracatheter Q12H   Continuous Infusions:  feeding supplement (OSMOLITE 1.5 CAL) 1,000 mL (10/10/23 1345)   PRN Meds:.morphine  injection, [DISCONTINUED] ondansetron  **OR** ondansetron  (ZOFRAN ) IV, mouth rinse, oxyCODONE , sodium chloride  flush    Subjective:   Jesus [name] was seen and examined today.  Continues to have increase secretions.  Objective:   Vitals:   10/10/23  0738 10/10/23 1223 10/10/23 1542 10/10/23 1722  BP: 103/65   115/65  Pulse: 86  92 100  Resp: 17  19 18   Temp: 98.1 F (36.7 C)     TempSrc:      SpO2: 100% 99% 96% 100%  Weight:      Height:        Intake/Output Summary (Last 24 hours) at 10/10/2023 1911 Last data filed at 10/10/2023 1152 Gross per 24 hour  Intake 150 ml  Output 1375 ml  Net -1225 ml   Filed Weights   10/08/23 0431 10/09/23 0702 10/10/23 0736  Weight: 100.3 kg 101.2 kg 101.4 kg     Exam General exam: ill appearing elderly gentleman, not in distress.  Respiratory system: increased secretions from the trach, diminished air entry, s/p trach  Cardiovascular system: S1 & S2 heard, RRR. No JVD, Gastrointestinal system: Abdomen is nondistended, soft and nontender.  Central nervous system: Alert and oriented. Able to follow commands.  Extremities: warm extremities.  Skin: No rashes,  Psychiatry: . Mood & affect appropriate.         Data Reviewed:  I have personally reviewed following labs and imaging studies   CBC Lab Results  Component Value Date   WBC 11.1 (H) 10/07/2023   RBC 3.19 (L) 10/07/2023   HGB 8.8 (L) 10/08/2023   HCT 28.1 (L) 10/08/2023   MCV 89.0 10/07/2023   MCH 27.0 10/07/2023   PLT 730 (H) 10/07/2023   MCHC 30.3 10/07/2023   RDW 18.3 (H) 10/07/2023   LYMPHSABS 2.0 10/07/2023   MONOABS 0.9 10/07/2023   EOSABS 0.1 10/07/2023   BASOSABS 0.0 10/07/2023     Last metabolic panel Lab Results  Component Value Date   NA 134 (L) 10/10/2023   K 5.1 10/10/2023   CL 93 (L) 10/10/2023   CO2 28 10/10/2023   BUN 47 (H) 10/10/2023   CREATININE 1.06 10/10/2023   GLUCOSE 181 (H) 10/10/2023   GFRNONAA >60 10/10/2023   GFRAA >60 11/28/2019   CALCIUM  9.3 10/10/2023   PHOS 4.1 09/27/2023   PROT 6.5 09/27/2023   ALBUMIN 1.9 (L) 09/27/2023   LABGLOB 2.3 08/21/2019   AGRATIO 2.0 08/21/2019   BILITOT 0.7 09/27/2023   ALKPHOS 120 09/27/2023   AST 11 (L) 09/27/2023   ALT 11 09/27/2023    ANIONGAP 13 10/10/2023    CBG (last 3)  Recent Labs    10/10/23 0739 10/10/23 1329 10/10/23 1725  GLUCAP 177* 126* 151*      Coagulation Profile: No results for input(s): "INR", "PROTIME" in the last 168 hours.   Radiology Studies: No results found.      Feliciana Horn M.D. [facility] 10/10/2023, 7:11 PM  Available via Epic secure chat 7am-7pm After 7 pm, please refer to night coverage provider listed on amion.

## 2023-10-10 NOTE — TOC Progression Note (Signed)
 Transition of Care [facility]) - Progression Note    Patient Details  Name: Jesus [name] MRN: [id number] Date of Birth: 1955/05/17  Transition of [facility] CM/SW Contact  Grettell Ransdell A [place], LCSW Phone Number: 10/10/2023, 4:22 PM  Clinical Narrative:     CSW spoke with pt's sister regarding denial of LTACH with his insurance and updated disposition plan for SNF. CSW informed her of [facility] and [facility] being the only placement options at this time. She said that she would discuss with pt and pt's family with updated information and get back with CSW. Contact information for CSW provided. CSW to reach out to [facility] regarding bed availability as well.    TOC will continue to follow.    Expected Discharge Plan: Skilled Nursing Facility Barriers to Discharge: Continued Medical Work up  Expected Discharge Plan and Services In-house Referral: Clinical Social Work   Post Acute Care Choice: Skilled Nursing Facility Living arrangements for the past 2 months: Single Family Home, Skilled Nursing Facility                                       Social Determinants of Health (SDOH) Interventions SDOH Screenings   Food Insecurity: Food Insecurity Present (09/25/2023)  Housing: High Risk (09/25/2023)  Transportation Needs: No Transportation Needs (09/25/2023)  Recent Concern: Transportation Needs - Unmet Transportation Needs (07/29/2023)  Utilities: Not At Risk (09/25/2023)  Depression (PHQ2-9): Low Risk  (11/09/2022)  Financial Resource Strain: Low Risk  (10/06/2020)  Social Connections: Moderately Isolated (09/25/2023)  Tobacco Use: Medium Risk (09/25/2023)    Readmission Risk Interventions    07/31/2023    4:28 PM 07/17/2023    3:08 PM 07/05/2023   11:49 AM  Readmission Risk Prevention Plan  Transportation Screening Complete Complete Complete  Medication Review Oceanographer) Complete Complete Complete  PCP or Specialist appointment within 3-5 days of discharge Complete  Complete Complete  HRI or Home Care Consult Complete Complete Complete  SW Recovery Care/Counseling Consult Complete Complete Complete  Palliative Care Screening Not Applicable Not Applicable Not Applicable  Skilled Nursing Facility Not Applicable Not Applicable Not Applicable

## 2023-10-10 NOTE — Plan of Care (Signed)
  Problem: Education: Goal: Knowledge of General Education information will improve Description: Including pain rating scale, medication(s)/side effects and non-pharmacologic comfort measures Outcome: Progressing   Problem: Clinical Measurements: Goal: Ability to maintain clinical measurements within normal limits will improve Outcome: Progressing Goal: Cardiovascular complication will be avoided Outcome: Progressing   Problem: Elimination: Goal: Will not experience complications related to bowel motility Outcome: Progressing   Problem: Pain Managment: Goal: General experience of comfort will improve and/or be controlled Outcome: Progressing

## 2023-10-11 DIAGNOSIS — C8338 Diffuse large B-cell lymphoma, lymph nodes of multiple sites: Secondary | ICD-10-CM | POA: Diagnosis not present

## 2023-10-11 DIAGNOSIS — D638 Anemia in other chronic diseases classified elsewhere: Secondary | ICD-10-CM | POA: Diagnosis not present

## 2023-10-11 LAB — GLUCOSE, CAPILLARY
Glucose-Capillary: 121 mg/dL — ABNORMAL HIGH (ref 70–99)
Glucose-Capillary: 125 mg/dL — ABNORMAL HIGH (ref 70–99)
Glucose-Capillary: 149 mg/dL — ABNORMAL HIGH (ref 70–99)
Glucose-Capillary: 166 mg/dL — ABNORMAL HIGH (ref 70–99)
Glucose-Capillary: 179 mg/dL — ABNORMAL HIGH (ref 70–99)
Glucose-Capillary: 180 mg/dL — ABNORMAL HIGH (ref 70–99)
Glucose-Capillary: 198 mg/dL — ABNORMAL HIGH (ref 70–99)

## 2023-10-11 MED ORDER — OSMOLITE 1.5 CAL PO LIQD
1000.0000 mL | ORAL | Status: DC
  Administered 2023-10-11 – 2023-10-13 (×3): 1000 mL
  Filled 2023-10-11 (×5): qty 1000

## 2023-10-11 MED ORDER — JUVEN PO PACK
1.0000 | PACK | Freq: Two times a day (BID) | ORAL | Status: DC
  Administered 2023-10-12 – 2023-10-13 (×3): 1
  Filled 2023-10-11 (×3): qty 1

## 2023-10-11 MED ORDER — LORAZEPAM 2 MG/ML IJ SOLN: 0.5000 mg | Freq: Once | INTRAMUSCULAR | Status: DC | PRN

## 2023-10-11 NOTE — Progress Notes (Signed)
 [facility]                                                                               Jesus [name], is a 68 y.o. male, DOB - 10-13-1955, ZOX:[id number] Admit date - 09/25/2023    Outpatient Primary MD for the patient is Jesus Congo, MD  LOS - 16  days    Brief summary   Jesus [name] is an 68 y.o. male past medical history of diffuse large B-cell lymphoma, metastatic laryngeal squamous carcinoma peritracheal mass status post tracheostomy on 08/03/2023 by thoracic surgery at [facility], essential hypertension chronic diastolic dysfunction anemia of chronic disease transferred from [facility] due to respiratory distress upon arrival was found tachycardic tachypneic and requiring 6 L of oxygen had a temperature of 102, with purulent tracheal secretions white count of 12 admitted on the [facility] service for sepsis secondary to pneumonia transferred to PCCM on 09/26/2023 CT scan of the neck revealed extensive debris encompassing the tracheostomy affected by tumor and necrotic tumor with trach esophageal fistula.     Assessment & Plan   Sepsis secondary to Multifocal pneumonia/acute respiratory failure with hypoxia secondary to malignancy: He completed 7-day course of IV Unasyn , now will continue 14-day course of Augmentin  for suppressive therapy. Seen by PCCM and ENT who opined that infection may lead to destructive airway compromise and tumor will continue to progress without chemotherapy which he is not a candidate to receive due to his infection. His only option would be to continue antibiotics followed by suppressive therapy given mediastinal swelling from the trachea esophageal fistula. Oncology evaluated the patient on this admission and recommended moving towards hospice care. Palliative care met with family and patient, they decided to continue aggressive treatment, he is a full code. Patient with increased secretions, Robitussin added.  CXR showing Improving  bibasilar aeration with residual streaky opacities in both lung bases. Patient was scheduled for trach change on 6/3, but in view of his sensitivity due to malignancy and risk of losing of patient airway, trach change is not recommended at this time.  He wanted to go to skilled nursing facility or LTAC.   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HCC)/with paratracheal mass status post tracheostomy on 08/03/2023 at [facility] / metastatic glandular squamous cell carcinoma: Oncology was consulted. He was previously seen by Dr. Elvan Hamel and Dr. Teofilo Fellers who did not feel intervention were possible he was seen at [facility] and was started on carboplatin  and Taxol. ENT was involved in the care and there is no good surgical repair option nor good options to replace the current tracheostomy tube without risking airway loss though he is not a candidate for surgical intervention. Not  a candidate for treatment at this time.  Palliative care was consulted and family and patient wants to continue aggressive care. Physicians have explained to the patient the severity of the case and they have unrealistic expectations. the patient understands the situation.   Anemia of chronic disease: Hemoglobin was 6.9. Transfused 1 unit of prbc transfusion.  Hemoglobin stable subsequently. Likely multifactorial secondary to malignancy and poor nutrition. Transfuse to keep hemoglobin greater than 7.  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  Tube feedings Continue tube feedings via PEG tube.   Thrombocytosis: Likely reactive.  They are improving slowly.   Essential hypertension Optimally controlled.   Hyperlipidemia Stable.  Chronic diastolic CHF.  CXR showing Possible small pleural effusions.   Hyponatremia:  Suspect from fluid overload.  One dose of IV lasix  40 mg given with improvement in sodium.  Continue to monitor.   Clogged ears:  - dubrox ear drops.   Type 2 DM with hyperglycemia.  Last  A1c is 8.9, recheck A1c is 6.6%.  Semglee  5 units and SSI.   Hyperkalemia Unclear etiology, improved with lokelma .  Check labs in the morning.   Disposition:  Insurance denied LTAC.  Toc on board.  Possible SNF   RN Pressure Injury Documentation: Pressure Injury 09/25/23 Coccyx Stage 1 -  Intact skin with non-blanchable redness of a localized area usually over a bony prominence. in gluteal cleft and bottom of sacrum/coccyx (Active)  09/25/23 2150  Location: Coccyx  Location Orientation:   Staging: Stage 1 -  Intact skin with non-blanchable redness of a localized area usually over a bony prominence.  Wound Description (Comments): in gluteal cleft and bottom of sacrum/coccyx  Present on Admission: Yes  Dressing Type Foam - Lift dressing to assess site every shift 10/10/23 2102    Code Status: full code.  DVT Prophylaxis:  Place and maintain sequential compression device Start: 10/04/23 0459 Place TED hose Start: 10/04/23 0459 Family Communication: Updated patient's family over the phone.  Disposition Plan:   SNF  Procedures:  None.   Consultants:   Palliative care.   Antimicrobials:   Anti-infectives (From admission, onward)    Start     Dose/Rate Route Frequency Ordered Stop   10/17/23 1000  amoxicillin -clavulanate (AUGMENTIN ) 500-125 MG per tablet 1 tablet       Placed in "Followed by" Linked Group   1 tablet Oral 2 times daily 09/29/23 1152     10/03/23 1000  amoxicillin -clavulanate (AUGMENTIN ) 875-125 MG per tablet 1 tablet       Placed in "Followed by" Linked Group   1 tablet Per Tube Every 12 hours 09/29/23 1152 10/17/23 0959   09/29/23 1200  Ampicillin -Sulbactam (UNASYN ) 3 g in sodium chloride  0.9 % 100 mL IVPB        3 g 200 mL/hr over 30 Minutes Intravenous Every 6 hours 09/29/23 1100 10/02/23 1824   09/28/23 1800  Vancomycin  (VANCOCIN ) 1,250 mg in sodium chloride  0.9 % 250 mL IVPB  Status:  Discontinued        1,250 mg 166.7 mL/hr over 90 Minutes Intravenous  Every 12 hours 09/28/23 1009 09/29/23 1058   09/27/23 0600  vancomycin  (VANCOREADY) IVPB 1500 mg/300 mL  Status:  Discontinued        1,500 mg 150 mL/hr over 120 Minutes Intravenous Every 12 hours 09/26/23 1747 09/28/23 1009   09/26/23 1845  vancomycin  (VANCOREADY) IVPB 2000 mg/400 mL        2,000 mg 200 mL/hr over 120 Minutes Intravenous  Once 09/26/23 1747 09/26/23 2053   09/26/23 1800  piperacillin -tazobactam (ZOSYN ) IVPB 3.375 g  Status:  Discontinued        3.375 g 12.5 mL/hr over 240 Minutes Intravenous Every 8 hours 09/26/23 1747 09/29/23 1058   09/26/23 0000  cefTRIAXone  (ROCEPHIN ) 2 g in sodium chloride  0.9 % 100 mL IVPB  Status:  Discontinued        2 g 200 mL/hr over 30 Minutes Intravenous Every 24 hours  09/25/23 2327 09/26/23 1726   09/26/23 0000  azithromycin  (ZITHROMAX ) 500 mg in sodium chloride  0.9 % 250 mL IVPB  Status:  Discontinued        500 mg 250 mL/hr over 60 Minutes Intravenous Every 24 hours 09/25/23 2327 09/26/23 1726   09/25/23 1415  vancomycin  (VANCOREADY) IVPB 1750 mg/350 mL        1,750 mg 175 mL/hr over 120 Minutes Intravenous  Once 09/25/23 1402 09/25/23 1854   09/25/23 1400  ceFEPIme  (MAXIPIME ) 2 g in sodium chloride  0.9 % 100 mL IVPB        2 g 200 mL/hr over 30 Minutes Intravenous  Once 09/25/23 1349 09/25/23 1459   09/25/23 1400  metroNIDAZOLE  (FLAGYL ) IVPB 500 mg        500 mg 100 mL/hr over 60 Minutes Intravenous  Once 09/25/23 1349 09/25/23 1854   09/25/23 1400  vancomycin  (VANCOCIN ) IVPB 1000 mg/200 mL premix  Status:  Discontinued        1,000 mg 200 mL/hr over 60 Minutes Intravenous  Once 09/25/23 1349 09/25/23 1357        Medications  Scheduled Meds:  sodium chloride    Intravenous Once   acetaminophen  (TYLENOL ) oral liquid 160 mg/5 mL  650 mg Per Tube Q6H   amoxicillin -clavulanate  1 tablet Per Tube Q12H   Followed by   Cecily Cohen ON 10/17/2023] amoxicillin -clavulanate  1 tablet Oral BID   carbamide peroxide  5 drop Both EARS BID    Chlorhexidine  Gluconate Cloth  6 each Topical Daily   feeding supplement (PROSource TF20)  60 mL Per Tube Daily   fentaNYL   1 patch Transdermal Q48H   gabapentin   300 mg Per Tube Q12H   guaiFENesin -dextromethorphan   10 mL Per Tube Q6H   insulin  aspart  0-9 Units Subcutaneous Q4H   insulin  aspart  2 Units Subcutaneous Q4H   insulin  glargine-yfgn  5 Units Subcutaneous Daily   lactobacillus  1 g Per Tube TID WC   mouth rinse  15 mL Mouth Rinse 4 times per day   pantoprazole  (PROTONIX ) IV  40 mg Intravenous Daily   polyethylene glycol  17 g Per Tube BID   senna  1 tablet Per Tube Daily   sodium chloride  flush  10-40 mL Intracatheter Q12H   Continuous Infusions:  feeding supplement (OSMOLITE 1.5 CAL) 80 mL/hr at 10/11/23 0519   PRN Meds:.LORazepam , morphine  injection, [DISCONTINUED] ondansetron  **OR** ondansetron  (ZOFRAN ) IV, mouth rinse, oxyCODONE , sodium chloride  flush    Subjective:   Unable to communicate due to his tracheostomy but denies any shortness of breath or chest pain.  Does have some discomfort in his throat area.  Denies any abdominal pain.  Objective:   Vitals:   10/11/23 0438 10/11/23 0728 10/11/23 0801 10/11/23 0919  BP: 119/75  (!) 145/88   Pulse: 91  98 91  Resp: 18   18  Temp: (!) 97.5 F (36.4 C)  97.6 F (36.4 C)   TempSrc:      SpO2: 100%  97% 94%  Weight:  100.9 kg    Height:        Intake/Output Summary (Last 24 hours) at 10/11/2023 1112 Last data filed at 10/11/2023 0805 Gross per 24 hour  Intake 662.67 ml  Output 1450 ml  Net -787.33 ml   Filed Weights   10/09/23 0702 10/10/23 0736 10/11/23 0728  Weight: 101.2 kg 101.4 kg 100.9 kg     Exam  General appearance: Awake alert.  In no distress Tracheostomy noted  with yellowish secretions. Resp: Normal effort at rest.  Coarse breath sounds bilaterally. Cardio: S1-S2 is normal regular.  No S3-S4.  No rubs murmurs or bruit GI: Abdomen is soft.  PEG tube is noted.   Data Reviewed:      CBC Lab Results  Component Value Date   WBC 11.1 (H) 10/07/2023   RBC 3.19 (L) 10/07/2023   HGB 8.8 (L) 10/08/2023   HCT 28.1 (L) 10/08/2023   MCV 89.0 10/07/2023   MCH 27.0 10/07/2023   PLT 730 (H) 10/07/2023   MCHC 30.3 10/07/2023   RDW 18.3 (H) 10/07/2023   LYMPHSABS 2.0 10/07/2023   MONOABS 0.9 10/07/2023   EOSABS 0.1 10/07/2023   BASOSABS 0.0 10/07/2023     Last metabolic panel Lab Results  Component Value Date   NA 134 (L) 10/10/2023   K 5.1 10/10/2023   CL 93 (L) 10/10/2023   CO2 28 10/10/2023   BUN 47 (H) 10/10/2023   CREATININE 1.06 10/10/2023   GLUCOSE 181 (H) 10/10/2023   GFRNONAA >60 10/10/2023   GFRAA >60 11/28/2019   CALCIUM  9.3 10/10/2023   PHOS 4.1 09/27/2023   PROT 6.5 09/27/2023   ALBUMIN 1.9 (L) 09/27/2023   LABGLOB 2.3 08/21/2019   AGRATIO 2.0 08/21/2019   BILITOT 0.7 09/27/2023   ALKPHOS 120 09/27/2023   AST 11 (L) 09/27/2023   ALT 11 09/27/2023   ANIONGAP 13 10/10/2023    CBG (last 3)  Recent Labs    10/11/23 0352 10/11/23 0440 10/11/23 0802  GLUCAP 121* 125* 179*     Maylene Spear M.D. [facility] 10/11/2023, 11:12 AM

## 2023-10-11 NOTE — Progress Notes (Signed)
 Nutrition Follow-up  DOCUMENTATION CODES:   Non-severe (moderate) malnutrition in context of chronic illness  INTERVENTION:   Change to Osmolite 1.5@75ml /hr + ProSource TF 20- Give 60ml daily via tube  Free water  flushes 30ml q4 hours to maintain tube patency   Regimen provides 2780kcal/day, 133g/day protein and 151ml/day of free water .   Daily weights   Juven Fruit Punch BID via tube, each serving provides 95kcal and 2.5g of protein (amino acids glutamine and arginine)  NUTRITION DIAGNOSIS:   Moderate Malnutrition related to chronic illness, cancer and cancer related treatments as evidenced by mild fat depletion, mild muscle depletion, percent weight loss. -ongoing   GOAL:   Patient will meet greater than or equal to 90% of their needs -met   MONITOR:   Labs, Weight trends, TF tolerance, I & O's, Skin  ASSESSMENT:   68 y.o. male past medical history of diffuse large B-cell lymphoma (in remission), metastatic laryngeal squamous carcinoma with peritracheal mass status post chemoradiation and s/p tracheostomy on 08/03/2023 by thoracic surgery at [facility], s/p G-tube placement 2/12, essential hypertension, chronic diastolic dysfunction, anemia of chronic disease, GERD, chronic pain, RLS, DM, HLD, COPD and substance abuse who admitted from [facility] due to respiratory distress and was found to have PNA, sepsis and extensive debris encompassing the tracheostomy affected by necrotic tumor with trach esophageal fistula.  RD working remotely.  Pt continues to tolerate tube feeds well at goal rate via G-tube. Will decrease estimated needs slightly as acute illness is improving. Will add Juven to support wound healing. Per chart, pt is up ~9lbs since admission but appears to be back to his UBW. Plan is for possible SNF at discharge.   Medications reviewed and include: augmentin , insulin , lactobacillus, protonix , miralax , senokot  Labs reviewed: Na 134(L), K 5.1 wnl, BUN  47(H)-6/3 Hgb 8.8(L), Hct 28.1(L)- 6/1 Cbgs- 149, 179, 125, 121 x 24 hrs   Diet Order:   Diet Order             Diet NPO time specified  Diet effective now                  EDUCATION NEEDS:   No education needs have been identified at this time  Skin:  Skin Assessment: Skin Integrity Issues: Skin Integrity Issues:: Stage I Stage I: coccyx  Last BM:  6/2  Height:   Ht Readings from Last 1 Encounters:  09/25/23 6' (1.829 m)    Weight:   Wt Readings from Last 1 Encounters:  10/11/23 100.9 kg   BMI:  Body mass index is 30.16 kg/m.  Estimated Nutritional Needs:   Kcal:  2500-2800kcal/day  Protein:  125-140g/day  Fluid:  2.1-2.4L/day  Torrance Freestone MS, RD, LDN If unable to be reached, please send secure chat to "RD inpatient" available from 8:00a-4:00p daily

## 2023-10-11 NOTE — Plan of Care (Signed)
  Problem: Education: Goal: Knowledge of General Education information will improve Description: Including pain rating scale, medication(s)/side effects and non-pharmacologic comfort measures Outcome: Progressing   Problem: Health Behavior/Discharge Planning: Goal: Ability to manage health-related needs will improve Outcome: Progressing   Problem: Clinical Measurements: Goal: Ability to maintain clinical measurements within normal limits will improve Outcome: Progressing Goal: Will remain free from infection Outcome: Progressing Goal: Diagnostic test results will improve Outcome: Progressing Goal: Respiratory complications will improve Outcome: Progressing Goal: Cardiovascular complication will be avoided Outcome: Progressing   Problem: Activity: Goal: Risk for activity intolerance will decrease Outcome: Progressing   Problem: Nutrition: Goal: Adequate nutrition will be maintained Outcome: Progressing   Problem: Coping: Goal: Level of anxiety will decrease Outcome: Progressing   Problem: Elimination: Goal: Will not experience complications related to bowel motility Outcome: Progressing Goal: Will not experience complications related to urinary retention Outcome: Progressing   Problem: Pain Managment: Goal: General experience of comfort will improve and/or be controlled Outcome: Progressing   Problem: Safety: Goal: Ability to remain free from injury will improve Outcome: Progressing   Problem: Skin Integrity: Goal: Risk for impaired skin integrity will decrease Outcome: Progressing   Problem: Education: Goal: Ability to describe self-care measures that may prevent or decrease complications (Diabetes Survival Skills Education) will improve Outcome: Progressing   Problem: Coping: Goal: Ability to adjust to condition or change in health will improve Outcome: Progressing   Problem: Fluid Volume: Goal: Ability to maintain a balanced intake and output will  improve Outcome: Progressing   Problem: Health Behavior/Discharge Planning: Goal: Ability to identify and utilize available resources and services will improve Outcome: Progressing Goal: Ability to manage health-related needs will improve Outcome: Progressing   Problem: Metabolic: Goal: Ability to maintain appropriate glucose levels will improve Outcome: Progressing   Problem: Nutritional: Goal: Maintenance of adequate nutrition will improve Outcome: Progressing Goal: Progress toward achieving an optimal weight will improve Outcome: Progressing   Problem: Skin Integrity: Goal: Risk for impaired skin integrity will decrease Outcome: Progressing   Problem: Tissue Perfusion: Goal: Adequacy of tissue perfusion will improve Outcome: Progressing   Problem: Fluid Volume: Goal: Hemodynamic stability will improve Outcome: Progressing   Problem: Clinical Measurements: Goal: Diagnostic test results will improve Outcome: Progressing Goal: Signs and symptoms of infection will decrease Outcome: Progressing   Problem: Respiratory: Goal: Ability to maintain adequate ventilation will improve Outcome: Progressing   Problem: Education: Goal: Knowledge about tracheostomy care/management will improve Outcome: Progressing   Problem: Activity: Goal: Ability to tolerate increased activity will improve Outcome: Progressing   Problem: Health Behavior/Discharge Planning: Goal: Ability to manage tracheostomy will improve Outcome: Progressing   Problem: Respiratory: Goal: Patent airway maintenance will improve Outcome: Progressing   Problem: Role Relationship: Goal: Ability to communicate will improve Outcome: Progressing

## 2023-10-11 NOTE — TOC Progression Note (Signed)
 Transition of Care [facility]) - Progression Note    Patient Details  Name: Jesus [name] MRN: [id number] Date of Birth: 1955-12-28  Transition [facility] Care [facility]) CM/SW Contact  Janisha Bueso A [place], LCSW Phone Number: 10/11/2023, 3:29 PM  Clinical Narrative:     CSW spoke with pt's sister, Cary Clarks. She said that she and pt's daughter Lidia Reels, were going to speak with pt at bedside today regarding SNF at [facility] given LTACH decline with pt's insurance. She said that daughter would reach out to CSW with update and decisions, confirmed has contact number for CSW.   CSW reached out to Angie at [facility] to follow up on bed availability in event plan to return to facility. Left VM with contact # to reach out to CSW.  TOC will continue to follow.   Expected Discharge Plan: Skilled Nursing Facility Barriers to Discharge: Continued Medical Work up  Expected Discharge Plan and Services In-house Referral: Clinical Social Work   Post Acute Care Choice: Skilled Nursing Facility Living arrangements for the past 2 months: Single Family Home, Skilled Nursing Facility                                       Social Determinants of Health (SDOH) Interventions SDOH Screenings   Food Insecurity: Food Insecurity Present (09/25/2023)  Housing: High Risk (09/25/2023)  Transportation Needs: No Transportation Needs (09/25/2023)  Recent Concern: Transportation Needs - Unmet Transportation Needs (07/29/2023)  Utilities: Not At Risk (09/25/2023)  Depression (PHQ2-9): Low Risk  (11/09/2022)  Financial Resource Strain: Low Risk  (10/06/2020)  Social Connections: Moderately Isolated (09/25/2023)  Tobacco Use: Medium Risk (09/25/2023)    Readmission Risk Interventions    07/31/2023    4:28 PM 07/17/2023    3:08 PM 07/05/2023   11:49 AM  Readmission Risk Prevention Plan  Transportation Screening Complete Complete Complete  Medication Review Oceanographer) Complete Complete Complete  PCP or Specialist appointment  within 3-5 days of discharge Complete Complete Complete  HRI or Home Care Consult Complete Complete Complete  SW Recovery Care/Counseling Consult Complete Complete Complete  Palliative Care Screening Not Applicable Not Applicable Not Applicable  Skilled Nursing Facility Not Applicable Not Applicable Not Applicable

## 2023-10-12 DIAGNOSIS — D638 Anemia in other chronic diseases classified elsewhere: Secondary | ICD-10-CM | POA: Diagnosis not present

## 2023-10-12 DIAGNOSIS — C8338 Diffuse large B-cell lymphoma, lymph nodes of multiple sites: Secondary | ICD-10-CM | POA: Diagnosis not present

## 2023-10-12 LAB — CBC
HCT: 27.9 % — ABNORMAL LOW (ref 39.0–52.0)
Hemoglobin: 8.6 g/dL — ABNORMAL LOW (ref 13.0–17.0)
MCH: 27.7 pg (ref 26.0–34.0)
MCHC: 30.8 g/dL (ref 30.0–36.0)
MCV: 90 fL (ref 80.0–100.0)
Platelets: 586 10*3/uL — ABNORMAL HIGH (ref 150–400)
RBC: 3.1 MIL/uL — ABNORMAL LOW (ref 4.22–5.81)
RDW: 18.1 % — ABNORMAL HIGH (ref 11.5–15.5)
WBC: 9.3 10*3/uL (ref 4.0–10.5)
nRBC: 0 % (ref 0.0–0.2)

## 2023-10-12 LAB — BASIC METABOLIC PANEL WITH GFR
Anion gap: 12 (ref 5–15)
BUN: 47 mg/dL — ABNORMAL HIGH (ref 8–23)
CO2: 27 mmol/L (ref 22–32)
Calcium: 9.4 mg/dL (ref 8.9–10.3)
Chloride: 93 mmol/L — ABNORMAL LOW (ref 98–111)
Creatinine, Ser: 1.01 mg/dL (ref 0.61–1.24)
GFR, Estimated: >60 mL/min (ref >60)
Glucose, Bld: 159 mg/dL — ABNORMAL HIGH (ref 70–99)
Potassium: 4.9 mmol/L (ref 3.5–5.1)
Sodium: 132 mmol/L — ABNORMAL LOW (ref 135–145)

## 2023-10-12 LAB — GLUCOSE, CAPILLARY
Glucose-Capillary: 142 mg/dL — ABNORMAL HIGH (ref 70–99)
Glucose-Capillary: 150 mg/dL — ABNORMAL HIGH (ref 70–99)
Glucose-Capillary: 157 mg/dL — ABNORMAL HIGH (ref 70–99)
Glucose-Capillary: 165 mg/dL — ABNORMAL HIGH (ref 70–99)
Glucose-Capillary: 180 mg/dL — ABNORMAL HIGH (ref 70–99)
Glucose-Capillary: 197 mg/dL — ABNORMAL HIGH (ref 70–99)

## 2023-10-12 LAB — MAGNESIUM: Magnesium: 2.1 mg/dL (ref 1.7–2.4)

## 2023-10-12 LAB — PHOSPHORUS: Phosphorus: 4 mg/dL (ref 2.5–4.6)

## 2023-10-12 NOTE — TOC Progression Note (Addendum)
 Transition of Care [facility]) - Progression Note    Patient Details  Name: Jesus [name] MRN: [id number] Date of Birth: 10-Sep-1955  Transition of Care [facility]) CM/SW Contact  Dane Dung, RN Phone Number: 10/12/2023, 1:38 PM  Clinical Narrative:    CM met with the patient at the bedside to discuss TOC needs.  The patient's daughter was at the bedside for discussion.  The patient's daughter states that she is reluctant for patient to return to [facility] SNF for care.  The patient states that he would go back to [facility] if he needs too. Other options for SNF placement were made to the patient as well for other potential SNF options for admission.  Patient's daughter states that the patient and family have not received trach teaching at this time.  Patient's daughter states that patient lives with sister and brother at the home.  No DME is present at the home at this time.  [facility] is unable to follow the patient for [facility] services when patient is able to leave the SNF.  I called [facility] and Amy, RNCM accepted for services for RN, PT for home health services to follow when he is able to return home after short-term rehabilitation.  [facility] will continue to follow the patient.  [place], MSW was notified and will follow-up with the patient and daughter regarding decision for SNF palcement.  I called and placed referral with financial counseling to call the daughter and assist with Medicaid application.   Expected Discharge Plan: Skilled Nursing Facility Barriers to Discharge: Continued Medical Work up  Expected Discharge Plan and Services In-house Referral: Clinical Social Work   Post Acute Care Choice: Skilled Nursing Facility Living arrangements for the past 2 months: Single Family Home, Skilled Nursing Facility                                       Social Determinants of Health (SDOH) Interventions SDOH Screenings   Food Insecurity: Food  Insecurity Present (09/25/2023)  Housing: High Risk (09/25/2023)  Transportation Needs: No Transportation Needs (09/25/2023)  Recent Concern: Transportation Needs - Unmet Transportation Needs (07/29/2023)  Utilities: Not At Risk (09/25/2023)  Depression (PHQ2-9): Low Risk  (11/09/2022)  Financial Resource Strain: Low Risk  (10/06/2020)  Social Connections: Moderately Isolated (09/25/2023)  Tobacco Use: Medium Risk (09/25/2023)    Readmission Risk Interventions    10/12/2023    1:38 PM 07/31/2023    4:28 PM 07/17/2023    3:08 PM  Readmission Risk Prevention Plan  Transportation Screening Complete Complete Complete  Medication Review Oceanographer) Complete Complete Complete  PCP or Specialist appointment within 3-5 days of discharge Complete Complete Complete  HRI or Home Care Consult Complete Complete Complete  SW Recovery Care/Counseling Consult Complete Complete Complete  Palliative Care Screening Complete Not Applicable Not Applicable  Skilled Nursing Facility Complete Not Applicable Not Applicable

## 2023-10-12 NOTE — Plan of Care (Signed)
  Problem: Education: Goal: Knowledge of General Education information will improve Description: Including pain rating scale, medication(s)/side effects and non-pharmacologic comfort measures Outcome: Progressing   Problem: Health Behavior/Discharge Planning: Goal: Ability to manage health-related needs will improve Outcome: Progressing   Problem: Clinical Measurements: Goal: Ability to maintain clinical measurements within normal limits will improve Outcome: Progressing Goal: Will remain free from infection Outcome: Progressing Goal: Diagnostic test results will improve Outcome: Progressing Goal: Respiratory complications will improve Outcome: Progressing Goal: Cardiovascular complication will be avoided Outcome: Progressing   Problem: Activity: Goal: Risk for activity intolerance will decrease Outcome: Progressing   Problem: Nutrition: Goal: Adequate nutrition will be maintained Outcome: Progressing   Problem: Coping: Goal: Level of anxiety will decrease Outcome: Progressing   Problem: Elimination: Goal: Will not experience complications related to bowel motility Outcome: Progressing Goal: Will not experience complications related to urinary retention Outcome: Progressing   Problem: Pain Managment: Goal: General experience of comfort will improve and/or be controlled Outcome: Progressing   Problem: Safety: Goal: Ability to remain free from injury will improve Outcome: Progressing   Problem: Skin Integrity: Goal: Risk for impaired skin integrity will decrease Outcome: Progressing   Problem: Education: Goal: Ability to describe self-care measures that may prevent or decrease complications (Diabetes Survival Skills Education) will improve Outcome: Progressing   Problem: Coping: Goal: Ability to adjust to condition or change in health will improve Outcome: Progressing   Problem: Fluid Volume: Goal: Ability to maintain a balanced intake and output will  improve Outcome: Progressing   Problem: Health Behavior/Discharge Planning: Goal: Ability to identify and utilize available resources and services will improve Outcome: Progressing Goal: Ability to manage health-related needs will improve Outcome: Progressing   Problem: Metabolic: Goal: Ability to maintain appropriate glucose levels will improve Outcome: Progressing   Problem: Nutritional: Goal: Maintenance of adequate nutrition will improve Outcome: Progressing Goal: Progress toward achieving an optimal weight will improve Outcome: Progressing   Problem: Skin Integrity: Goal: Risk for impaired skin integrity will decrease Outcome: Progressing   Problem: Tissue Perfusion: Goal: Adequacy of tissue perfusion will improve Outcome: Progressing   Problem: Fluid Volume: Goal: Hemodynamic stability will improve Outcome: Progressing   Problem: Clinical Measurements: Goal: Diagnostic test results will improve Outcome: Progressing Goal: Signs and symptoms of infection will decrease Outcome: Progressing   Problem: Respiratory: Goal: Ability to maintain adequate ventilation will improve Outcome: Progressing   Problem: Education: Goal: Knowledge about tracheostomy care/management will improve Outcome: Progressing   Problem: Activity: Goal: Ability to tolerate increased activity will improve Outcome: Progressing   Problem: Health Behavior/Discharge Planning: Goal: Ability to manage tracheostomy will improve Outcome: Progressing   Problem: Respiratory: Goal: Patent airway maintenance will improve Outcome: Progressing   Problem: Role Relationship: Goal: Ability to communicate will improve Outcome: Progressing

## 2023-10-12 NOTE — Progress Notes (Signed)
 PT Cancellation Note  Patient Details Name: Jesus [name] MRN: [id number] DOB: 1955/11/15   Cancelled Treatment:    Reason Eval/Treat Not Completed: (P) Other (comment);Patient declined, no reason specified (pt reports he just woke up and wants PTA to return later in the day. RN called to room to address some hygiene/suctioning needs at trach collar/gown.) PTA did not have time to reattempt, will continue efforts next date per PT plan of care as schedule permits.   Mariel Shope Akita Maxim 10/12/2023, 5:19 PM

## 2023-10-12 NOTE — TOC Progression Note (Signed)
 Transition of Care [facility]) - Progression Note    Patient Details  Name: Jesus [name] MRN: [id number] Date of Birth: 02-03-1956  Transition of Care [facility]) CM/SW Contact  Daymen Hassebrock A [place], LCSW Phone Number: 10/12/2023, 2:26 PM  Clinical Narrative:     CSW started insurance authorization for possible placement to SNF. CSW spoke with Debria Fang at [facility] and requested authorization. Status pending. CSW to provide facility to insurance once family has decided placement.    TOC will continue to follow.   Expected Discharge Plan: Skilled Nursing Facility Barriers to Discharge: Continued Medical Work up  Expected Discharge Plan and Services In-house Referral: Clinical Social Work   Post Acute Care Choice: Skilled Nursing Facility Living arrangements for the past 2 months: Single Family Home, Skilled Nursing Facility                                       Social Determinants of Health (SDOH) Interventions SDOH Screenings   Food Insecurity: Food Insecurity Present (09/25/2023)  Housing: High Risk (09/25/2023)  Transportation Needs: No Transportation Needs (09/25/2023)  Recent Concern: Transportation Needs - Unmet Transportation Needs (07/29/2023)  Utilities: Not At Risk (09/25/2023)  Depression (PHQ2-9): Low Risk  (11/09/2022)  Financial Resource Strain: Low Risk  (10/06/2020)  Social Connections: Moderately Isolated (09/25/2023)  Tobacco Use: Medium Risk (09/25/2023)    Readmission Risk Interventions    10/12/2023    1:38 PM 07/31/2023    4:28 PM 07/17/2023    3:08 PM  Readmission Risk Prevention Plan  Transportation Screening Complete Complete Complete  Medication Review Oceanographer) Complete Complete Complete  PCP or Specialist appointment within 3-5 days of discharge Complete Complete Complete  HRI or Home Care Consult Complete Complete Complete  SW Recovery Care/Counseling Consult Complete Complete Complete  Palliative Care Screening Complete Not Applicable Not Applicable   Skilled Nursing Facility Complete Not Applicable Not Applicable

## 2023-10-12 NOTE — Progress Notes (Signed)
 [facility]                                                                               Essex Perry, is a 68 y.o. male, DOB - 28-Apr-1956, JOA:[id number] Admit date - 09/25/2023    Outpatient Primary MD for the patient is [name], MD  LOS - 17  days    Brief summary   Jesus [name] is an 68 y.o. male past medical history of diffuse large B-cell lymphoma, metastatic laryngeal squamous carcinoma peritracheal mass status post tracheostomy on 08/03/2023 by thoracic surgery at [facility], essential hypertension chronic diastolic dysfunction anemia of chronic disease transferred from [facility] due to respiratory distress upon arrival was found tachycardic tachypneic and requiring 6 L of oxygen had a temperature of 102, with purulent tracheal secretions white count of 12 admitted on the triad service for sepsis secondary to pneumonia transferred to PCCM on 09/26/2023 CT scan of the neck revealed extensive debris encompassing the tracheostomy affected by tumor and necrotic tumor with trach esophageal fistula.     Assessment & Plan   Sepsis secondary to Multifocal pneumonia/acute respiratory failure with hypoxia secondary to malignancy: He completed 7-day course of IV Unasyn .  Now on an extended course of Augmentin . Seen by PCCM and ENT who opined that infection may lead to destructive airway compromise and tumor will continue to progress without chemotherapy which he is not a candidate to receive due to his infection. His only option would be to continue antibiotics followed by suppressive therapy given mediastinal swelling from the trachea esophageal fistula. Oncology evaluated the patient on this admission and recommended moving towards hospice care. Palliative care met with family and patient, they decided to continue aggressive treatment, he is a full code. Patient with increased secretions, Robitussin was added.  CXR showing Improving bibasilar aeration with residual  streaky opacities in both lung bases. Patient was scheduled for trach change on 6/3, but in view of his sensitivity due to malignancy and risk of losing of patient airway, trach change is not recommended at this time.  Respiratory status is stable.   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HCC)/with paratracheal mass status post tracheostomy on 08/03/2023 at [facility] / metastatic glandular squamous cell carcinoma: Oncology was consulted. He was previously seen by Dr. Elvan Hamel and Dr. Teofilo Fellers who did not feel intervention were possible he was seen at [facility] and was started on carboplatin  and Taxol. ENT was involved in the care and there is no good surgical repair option nor good options to replace the current tracheostomy tube without risking airway loss though he is not a candidate for surgical intervention. Not  a candidate for treatment at this time.  Palliative care was consulted and family and patient wants to continue aggressive care. Physicians have explained to the patient the severity of the case and they have unrealistic expectations. the patient understands the situation.   Anemia of chronic disease: Hemoglobin was 6.9. Transfused 1 unit of prbc transfusion.  Hemoglobin stable subsequently. Likely multifactorial secondary to malignancy and poor nutrition. Transfuse to keep hemoglobin greater than 7.  Hemoglobin is stable.   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  Tube feedings Continue tube feedings via PEG tube.   Thrombocytosis: Likely reactive.  Noted to be improving.   Essential hypertension Optimally controlled.   Hyperlipidemia Stable.  Chronic diastolic CHF.  CXR showing Possible small pleural effusions.   Hyponatremia:  Suspect from fluid overload.  One dose of IV lasix  40 mg given with improvement in sodium.   Clogged ears:  - dubrox ear drops.   Type 2 DM with hyperglycemia.  Last A1c is 8.9, recheck A1c is 6.6%.  Semglee  5 units and SSI.   CBGs are reasonably well-controlled.  Hyperkalemia Unclear etiology, improved with lokelma .  Normal this morning.   Disposition:  Insurance denied LTAC.  Toc on board.  Possible SNF   RN Pressure Injury Documentation: Pressure Injury 09/25/23 Coccyx Stage 1 -  Intact skin with non-blanchable redness of a localized area usually over a bony prominence. in gluteal cleft and bottom of sacrum/coccyx (Active)  09/25/23 2150  Location: Coccyx  Location Orientation:   Staging: Stage 1 -  Intact skin with non-blanchable redness of a localized area usually over a bony prominence.  Wound Description (Comments): in gluteal cleft and bottom of sacrum/coccyx  Present on Admission: Yes  Dressing Type Foam - Lift dressing to assess site every shift 10/10/23 2102    Code Status: full code.  DVT Prophylaxis:  Place and maintain sequential compression device Start: 10/04/23 0459 Place TED hose Start: 10/04/23 0459 Family Communication: Updated patient's family over the phone.  Disposition Plan:   SNF  Procedures:  None.   Consultants:   Palliative care.    Medications Scheduled Meds:  sodium chloride    Intravenous Once   acetaminophen  (TYLENOL ) oral liquid 160 mg/5 mL  650 mg Per Tube Q6H   amoxicillin -clavulanate  1 tablet Per Tube Q12H   Followed by   Cecily Cohen ON 10/17/2023] amoxicillin -clavulanate  1 tablet Oral BID   carbamide peroxide  5 drop Both EARS BID   Chlorhexidine  Gluconate Cloth  6 each Topical Daily   feeding supplement (PROSource TF20)  60 mL Per Tube Daily   fentaNYL   1 patch Transdermal Q48H   gabapentin   300 mg Per Tube Q12H   guaiFENesin -dextromethorphan   10 mL Per Tube Q6H   insulin  aspart  0-9 Units Subcutaneous Q4H   insulin  aspart  2 Units Subcutaneous Q4H   insulin  glargine-yfgn  5 Units Subcutaneous Daily   lactobacillus  1 g Per Tube TID WC   nutrition supplement (JUVEN)  1 packet Per Tube BID BM   mouth rinse  15 mL Mouth Rinse 4 times per day    pantoprazole  (PROTONIX ) IV  40 mg Intravenous Daily   polyethylene glycol  17 g Per Tube BID   senna  1 tablet Per Tube Daily   sodium chloride  flush  10-40 mL Intracatheter Q12H   Continuous Infusions:  feeding supplement (OSMOLITE 1.5 CAL) 1,000 mL (10/11/23 1734)   PRN Meds:.LORazepam , morphine  injection, [DISCONTINUED] ondansetron  **OR** ondansetron  (ZOFRAN ) IV, mouth rinse, oxyCODONE , sodium chloride  flush    Subjective:   Denies any shortness of breath or chest pain.  No nausea vomiting.  Objective:   Vitals:   10/11/23 1700 10/11/23 2033 10/12/23 0444 10/12/23 0722  BP: (!) 142/84 124/77 114/70   Pulse: 91 95 98 93  Resp: 18 18 18 18   Temp: (!) 97.5 F (36.4 C) 98.3 F (36.8 C) 98.6 F (37 C)   TempSrc: Oral Oral Oral   SpO2:  98% 97% 95%  Weight:   99.5 kg  Height:        Intake/Output Summary (Last 24 hours) at 10/12/2023 1028 Last data filed at 10/12/2023 0925 Gross per 24 hour  Intake 1771.25 ml  Output 1100 ml  Net 671.25 ml   Filed Weights   10/10/23 0736 10/11/23 0728 10/12/23 0444  Weight: 101.4 kg 100.9 kg 99.5 kg     Exam  Awake alert.  In no distress Tracheostomy noted with yellowish secretions Normal effort at rest.  Coarse breath sounds bilaterally. S1-S2 is normal regular. Abdomen is soft.  PEG tube is noted..   Data Reviewed:     CBC Lab Results  Component Value Date   WBC 9.3 10/12/2023   RBC 3.10 (L) 10/12/2023   HGB 8.6 (L) 10/12/2023   HCT 27.9 (L) 10/12/2023   MCV 90.0 10/12/2023   MCH 27.7 10/12/2023   PLT 586 (H) 10/12/2023   MCHC 30.8 10/12/2023   RDW 18.1 (H) 10/12/2023   LYMPHSABS 2.0 10/07/2023   MONOABS 0.9 10/07/2023   EOSABS 0.1 10/07/2023   BASOSABS 0.0 10/07/2023     Last metabolic panel Lab Results  Component Value Date   NA 132 (L) 10/12/2023   K 4.9 10/12/2023   CL 93 (L) 10/12/2023   CO2 27 10/12/2023   BUN 47 (H) 10/12/2023   CREATININE 1.01 10/12/2023   GLUCOSE 159 (H) 10/12/2023   GFRNONAA  >60 10/12/2023   GFRAA >60 11/28/2019   CALCIUM  9.4 10/12/2023   PHOS 4.0 10/12/2023   PROT 6.5 09/27/2023   ALBUMIN 1.9 (L) 09/27/2023   LABGLOB 2.3 08/21/2019   AGRATIO 2.0 08/21/2019   BILITOT 0.7 09/27/2023   ALKPHOS 120 09/27/2023   AST 11 (L) 09/27/2023   ALT 11 09/27/2023   ANIONGAP 12 10/12/2023    CBG (last 3)  Recent Labs    10/11/23 2343 10/12/23 0438 10/12/23 0915  GLUCAP 180* 142* 197*     Maylene Spear M.D. [facility] 10/12/2023, 10:28 AM

## 2023-10-13 ENCOUNTER — Inpatient Hospital Stay (HOSPITAL_COMMUNITY): Admitting: Anesthesiology

## 2023-10-13 LAB — GLUCOSE, CAPILLARY
Glucose-Capillary: 174 mg/dL — ABNORMAL HIGH (ref 70–99)
Glucose-Capillary: 161 mg/dL — ABNORMAL HIGH (ref 70–99)

## 2023-10-13 MED ORDER — SODIUM CHLORIDE 0.9% IV SOLUTION: Freq: Once | INTRAVENOUS | Status: DC

## 2023-10-13 MED ORDER — FENTANYL CITRATE PF 50 MCG/ML IJ SOSY
100.0000 ug | PREFILLED_SYRINGE | Freq: Once | INTRAMUSCULAR | Status: DC
  Filled 2023-10-13: qty 2

## 2023-10-13 MED ORDER — MIDAZOLAM HCL 2 MG/2ML IJ SOLN
2.0000 mg | Freq: Once | INTRAMUSCULAR | Status: DC
  Filled 2023-10-13: qty 2

## 2023-10-13 MED FILL — Fosaprepitant Dimeglumine For IV Infusion 150 MG (Base Eq): INTRAVENOUS | Qty: 5 | Status: AC

## 2023-10-16 ENCOUNTER — Other Ambulatory Visit

## 2023-10-16 ENCOUNTER — Ambulatory Visit: Admitting: Hematology

## 2023-10-16 ENCOUNTER — Encounter

## 2023-10-16 ENCOUNTER — Ambulatory Visit

## 2023-10-18 ENCOUNTER — Ambulatory Visit

## 2023-10-22 DIAGNOSIS — C159 Malignant neoplasm of esophagus, unspecified: Secondary | ICD-10-CM | POA: Diagnosis not present

## 2023-10-22 DIAGNOSIS — K9423 Gastrostomy malfunction: Secondary | ICD-10-CM | POA: Diagnosis not present

## 2023-10-22 DIAGNOSIS — E43 Unspecified severe protein-calorie malnutrition: Secondary | ICD-10-CM | POA: Diagnosis not present

## 2023-10-22 DIAGNOSIS — R1310 Dysphagia, unspecified: Secondary | ICD-10-CM | POA: Diagnosis not present

## 2023-11-07 NOTE — Progress Notes (Signed)
 RT responded to Code Blue. Attempted to change 5XLT cuffless trach to 5 XLT cuffed trach but could not ventilate. Decision was made by MD to remove trach and intubate w/6.0 ETT secured @ 22cm. And stoma site was occuluded with gauze and pressure dressing.

## 2023-11-07 NOTE — Progress Notes (Signed)
 RT arrived at bedside in response to code blue alarm. Upon arrival at bedside, RN x 3 at bedside, pt had copious amounts of blood coming from trach site and out of trach. Pts 5 xlt uncuffed trach exchanged to 5 xlt cuffed by RT X3 at bedside per MD at bedside in order to provide BVM. However there was no color change after #5 XLT cuff trach placed. Pt orally intubated by Anesthesia at bedside, trach removed and pressure dressing applied to stoma site per anesthesia at bedside. ETT secured with tube holder.

## 2023-11-07 NOTE — Progress Notes (Signed)
 Code Blue called and time of death 0936.

## 2023-11-07 NOTE — Death Summary Note (Signed)
 DEATH SUMMARY   Patient Details  Name: Jesus [name] MRN: [id number] DOB: Nov 27, 1955 MVH:QIONGE, Jesus Kalata, MD Admission/Discharge Information   Admit Date:  Oct 05, 2023  Date of Death: Date of Death: Oct 23, 2023  Time of Death: Time of Death: 0936  Length of Stay: 11/04/23   Principle Cause of death: Hypoxia   Hospital Diagnoses: Principal Problem:   Multifocal pneumonia Active Problems:   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HCC)   Anemia of chronic disease   Sepsis due to pneumonia (HCC)   Acute metabolic encephalopathy   HTN (hypertension)   Chronic pain   ED (erectile dysfunction) of organic origin   Gastroesophageal reflux disease without esophagitis   Protein-calorie malnutrition, severe   Acute hypoxic respiratory failure (HCC)   Protein-calorie malnutrition, moderate (HCC)   Pressure injury   Hospital Course: Jesus [name] is an 68 y.o. male past medical history of diffuse large B-cell lymphoma, metastatic laryngeal squamous carcinoma peritracheal mass status post tracheostomy on 08/03/2023 by thoracic surgery at [facility], essential hypertension chronic diastolic dysfunction anemia of chronic disease transferred from [facility] due to respiratory distress upon arrival was found tachycardic tachypneic and requiring 6 L of oxygen had a temperature of 102, with purulent tracheal secretions white count of 12 admitted on the triad service for sepsis secondary to pneumonia transferred to PCCM on 09/26/2023 CT scan of the neck revealed extensive debris encompassing the tracheostomy affected by tumor and necrotic tumor with trach esophageal fistula.   10/23/23 AM code blue called. See 2023-10-23 procedure note for further details. It was felt that patient likely had PEA arrest due to hypoxia from obstruction/malposition of tracheostomy tube. ROSC was briefly achieved but patient arrested again and resuscitation efforts were stopped.    Prior to his arrest  patient was being treated for a pneumonia as well.    Sepsis secondary to Multifocal pneumonia/acute respiratory failure with hypoxia secondary to malignancy: He completed 7-day course of IV Unasyn .  Now on an extended course of Augmentin . Seen by PCCM and ENT who opined that infection may lead to destructive airway compromise and tumor will continue to progress without chemotherapy which he is not a candidate to receive due to his infection. His only option would be to continue antibiotics followed by suppressive therapy given mediastinal swelling from the trachea esophageal fistula. Oncology evaluated the patient on this admission and recommended moving towards hospice care. Palliative care met with family and patient, they decided to continue aggressive treatment. Patient with increased secretions, Robitussin was added.  CXR showing Improving bibasilar aeration with residual streaky opacities in both lung bases. Patient was scheduled for trach change on 6/3, but in view of his sensitivity due to malignancy and risk of losing of patient airway, trach change is not recommended at this time. Patient had PEA arrest 6/6 AM. See hospital course for further details.     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HCC)/with paratracheal mass status post tracheostomy on 08/03/2023 at [facility] / metastatic glandular squamous cell carcinoma: Oncology was consulted. He was previously seen by Dr. Elvan [name] and Dr. Teofilo [name] who did not feel intervention were possible he was seen at [facility] and was started on carboplatin  and Taxol. ENT was involved in the care and there is no good surgical repair option nor good options to replace the current tracheostomy tube without risking airway loss though he is not a candidate for surgical intervention. Not  a candidate for treatment at  this time.  Palliative care was consulted and family and patient wants to continue aggressive care. Physicians have explained  to the patient the severity of the case.    Anemia of chronic disease   Dysphagia: Secondary to malignancy. Requires TFs  Thrombocytosis: Likely reactive.    Essential hypertension  Hyperlipidemia  Chronic diastolic CHF.   Hyponatremia:  Suspect from fluid overload.    Type 2 DM with hyperglycemia.  Last A1c is 8.9, recheck A1c is 6.6%.  Semglee  5 units and SSI.  CBGs are reasonably well-controlled.   .       Procedures: Intubation from above   Consultations: PCCM   The results of significant diagnostics from this hospitalization (including imaging, microbiology, ancillary and laboratory) are listed below for reference.   Significant Diagnostic Studies: DG CHEST PORT 1 VIEW Result Date: 10/05/2023 CLINICAL DATA:  Dyspnea and respiratory distress. EXAM: PORTABLE CHEST 1 VIEW COMPARISON:  Radiograph and CT 09/25/2023 FINDINGS: Tracheostomy tube tip at the thoracic inlet. Accessed right chest port in place. Stable cardiomegaly. Unchanged mediastinal contours. Improving bibasilar aeration. Residual streaky opacities in both lung bases. No pneumothorax. There may be small pleural effusions. IMPRESSION: 1. Improving bibasilar aeration with residual streaky opacities in both lung bases. Possible small pleural effusions. 2. Stable cardiomegaly. 3. Tracheostomy tube tip at the thoracic inlet. Electronically Signed   By: Jesus [name] M.D.   On: 10/05/2023 16:16   CT Soft Tissue Neck W Contrast Result Date: 09/26/2023 CLINICAL DATA:  Shortness of breath EXAM: CT NECK WITH CONTRAST TECHNIQUE: Multidetector CT imaging of the neck was performed using the standard protocol following the bolus administration of intravenous contrast. RADIATION DOSE REDUCTION: This exam was performed according to the departmental dose-optimization program which includes automated exposure control, adjustment of the mA and/or kV according to patient size and/or use of iterative reconstruction technique.  CONTRAST:  75mL OMNIPAQUE  IOHEXOL  300 MG/ML  SOLN COMPARISON:  06/15/2023 FINDINGS: Pharynx and larynx: Irregular masslike thickening involving the upper cervical esophagus and infraglottic airway with tracheostomy tube. Patchy gas bubbles encompass the tracheostomy tube in the airway at the thoracic inlet. This frothy material may reflect necrotic tumor, infection, or prior bleeding - no active hemorrhage is seen. Patient is certainly at risk for esophagotracheal fistula. The degree of tracheal narrowing is improved. Submucosal low-density thickening involving the supraglottic airway, symmetric and presumably related to radiotherapy. Salivary glands: No acute finding Thyroid : Patchy low-density in the right thyroid  since prior, direct invasion of the left para median thyroid  suspected on comparison study. The cystic spaces could reflect progressive tumor with necrosis or infectious collections, they diffusely involve the right lobe. Lymph nodes: Multiple heterogeneous enhancing cervical lymph nodes in the left jugular chain which are more prominent than before, up to 14 mm in the left lower neck on 6:91 Vascular: Extrinsic narrowing of the lower left IJ. Porta catheter on the right in unremarkable position. No pseudoaneurysm seen. Limited intracranial: No acute finding Visualized orbits: No acute finding Mastoids and visualized paranasal sinuses: Clear Skeleton: Generalized cervical spine degeneration. Upper chest: Clear apical lungs IMPRESSION: Extensive debris like appearance encompassing the tracheostomy at the upper airway which is extensively affected by tumor. This could reflect infectious debris, blood clot, and or necrotic tumor. Patient is certainly at risk for tracheoesophageal fistula given the extent of tumor. Evidence of direct left thyroid  invasion by tumor on prior study. Progressive low-density areas in the right lobe thyroid  which could be infiltrating tumor or infectious collections. Submucosal  thickening of  the supraglottic airway, symmetric and presumably treatment related. Malignant adenopathy in the left jugular chain. Electronically Signed   By: Ronnette Coke M.D.   On: 09/26/2023 09:18   CT CHEST ABDOMEN PELVIS WO CONTRAST Result Date: 09/25/2023 CLINICAL DATA:  Sepsis cancer EXAM: CT CHEST, ABDOMEN AND PELVIS WITHOUT CONTRAST TECHNIQUE: Multidetector CT imaging of the chest, abdomen and pelvis was performed following the standard protocol without IV contrast. RADIATION DOSE REDUCTION: This exam was performed according to the departmental dose-optimization program which includes automated exposure control, adjustment of the mA and/or kV according to patient size and/or use of iterative reconstruction technique. COMPARISON:  CT 08/03/2023, 07/15/2023, PET CT 04/14/2023 FINDINGS: CT CHEST FINDINGS Cardiovascular: Limited evaluation without intravenous contrast. Right-sided central venous port tip at the cavoatrial region. No significant pericardial effusion. Nonaneurysmal aorta Mediastinum/Nodes: Interim tracheostomy tube. Abnormal gas and debris surrounding the tracheostomy tube, series 2, image 4 through 10. Tip of the tube terminates several cm above the carina. Tracheal deviation to the right from known mass lesion. Enlarged right paratracheal node measures 14 mm compared with 10 mm previously. Interval small volume fluid in the anterior mediastinum and generalized hazy appearance of the mediastinal fat compared to prior. Circumferential soft tissue mass/thickening at the included thoracic inlet surrounds the trachea. Mass appears slightly smaller compared with 07/15/2023 chest CT. Hazy density surrounding the distal trachea to the bifurcation and extends to the subcarinal region. Lungs/Pleura: Dense bilateral lower lobe consolidations with air bronchograms. Adjacent heterogeneous consolidations and ground-glass densities. No pleural effusion. Musculoskeletal: No acute osseous abnormality CT  ABDOMEN PELVIS FINDINGS Hepatobiliary: No focal liver abnormality is seen. No gallstones, gallbladder wall thickening, or biliary dilatation. Pancreas: Unremarkable. No pancreatic ductal dilatation or surrounding inflammatory changes. Spleen: Normal in size without focal abnormality. Adrenals/Urinary Tract: Adrenal glands are stable in appearance. Bilateral renal cysts for which no imaging follow-up is recommended. Punctate nonobstructing left kidney stone. No hydronephrosis. Nonspecific left greater than right perinephric stranding. The bladder is unremarkable Stomach/Bowel: The stomach contains a gastrostomy tube in the distal body. There is no dilated small bowel. No acute bowel wall thickening. Negative appendix. Vascular/Lymphatic: Aortic atherosclerosis. No enlarged abdominal or pelvic lymph nodes. Reproductive: Slightly enlarged prostate Other: Negative for pelvic effusion or free air. Small fat containing umbilical hernia. Musculoskeletal: Degenerative changes. No acute osseous abnormality. IMPRESSION: 1. Interim tracheostomy tube with abnormal appearance of gas and debris surrounding the tracheostomy tube. Findings could be secondary to focal perforation versus infection. Contrast-enhanced CT of the neck/upper chest could be obtained if patient able to tolerate. 2. Interval small volume fluid and stranding in the anterior mediastinum and generalized hazy appearance of the mediastinal fat compared to prior, which may be inflammatory or infectious in etiology. New dense bilateral lower lobe consolidations with air bronchograms, suspect for multifocal pneumonia and or aspiration. 3. Circumferential soft tissue mass/thickening at the included thoracic inlet surrounds the trachea. Left paratracheal mass appears slightly smaller compared with 07/15/2023 chest CT, but there is slight interval enlargement of the right paratracheal node. Hazy density surrounding the distal trachea to the bifurcation and extends to  the subcarinal region, difficult to assess without contrast, possible edema, infection, or additional soft tissue mass. 4. No CT evidence for acute intra-abdominal or pelvic abnormality. Punctate nonobstructing left kidney stone. Nonspecific left greater than right perinephric stranding, correlate with urinalysis. 5. Aortic atherosclerosis. Aortic Atherosclerosis (ICD10-I70.0). Electronically Signed   By: Esmeralda Hedge M.D.   On: 09/25/2023 20:25   DG Chest Port 1 View Result Date:  09/25/2023 CLINICAL DATA:  Questionable sepsis. EXAM: PORTABLE CHEST 1 VIEW COMPARISON:  Chest radiograph dated 07/29/2023. FINDINGS: Right-sided Port-A-Cath with tip at the cavoatrial junction. Shallow inspiration with bibasilar atelectasis. No pleural effusion pneumothorax. The cardiac silhouette is within limits. Tracheostomy approximately 5 cm above the carina. No acute osseous pathology. IMPRESSION: Shallow inspiration with bibasilar atelectasis. Electronically Signed   By: Angus Bark M.D.   On: 09/25/2023 15:34    Microbiology: No results found for this or any previous visit (from the past 240 hours).  Time spent: 35 minutes  Signed: Joette Mustard, MD 10/30/2023

## 2023-11-07 NOTE — Progress Notes (Signed)
 Assessment and AM medications completed and this RN left Pt's room to see another.  PCT came and got RN to say that pt's trach was bleeding.  Upon entering room, bright red blood noted coming out of trach when pt coughed.  This RN had PCT go to desk for addition help.  Bleeding from trach increased and was unable to be stopped.  Code Blue button pulled for quicker help from medical team not on unit.  Pt's pulse stopped at 0900 and CPR was started.  See Code Blue sheet.

## 2023-11-07 NOTE — Progress Notes (Signed)
Pt taken to morgue. 

## 2023-11-07 NOTE — Plan of Care (Signed)
  Problem: Education: Goal: Knowledge of General Education information will improve Description: Including pain rating scale, medication(s)/side effects and non-pharmacologic comfort measures Outcome: Progressing   Problem: Health Behavior/Discharge Planning: Goal: Ability to manage health-related needs will improve Outcome: Progressing   Problem: Clinical Measurements: Goal: Ability to maintain clinical measurements within normal limits will improve Outcome: Progressing Goal: Will remain free from infection Outcome: Progressing Goal: Diagnostic test results will improve Outcome: Progressing Goal: Respiratory complications will improve Outcome: Progressing Goal: Cardiovascular complication will be avoided Outcome: Progressing   Problem: Activity: Goal: Risk for activity intolerance will decrease Outcome: Progressing   Problem: Nutrition: Goal: Adequate nutrition will be maintained Outcome: Progressing   Problem: Coping: Goal: Level of anxiety will decrease Outcome: Progressing   Problem: Elimination: Goal: Will not experience complications related to bowel motility Outcome: Progressing Goal: Will not experience complications related to urinary retention Outcome: Progressing   Problem: Pain Managment: Goal: General experience of comfort will improve and/or be controlled Outcome: Progressing   Problem: Safety: Goal: Ability to remain free from injury will improve Outcome: Progressing   Problem: Skin Integrity: Goal: Risk for impaired skin integrity will decrease Outcome: Progressing   Problem: Education: Goal: Ability to describe self-care measures that may prevent or decrease complications (Diabetes Survival Skills Education) will improve Outcome: Progressing   Problem: Coping: Goal: Ability to adjust to condition or change in health will improve Outcome: Progressing   Problem: Fluid Volume: Goal: Ability to maintain a balanced intake and output will  improve Outcome: Progressing   Problem: Health Behavior/Discharge Planning: Goal: Ability to identify and utilize available resources and services will improve Outcome: Progressing Goal: Ability to manage health-related needs will improve Outcome: Progressing   Problem: Metabolic: Goal: Ability to maintain appropriate glucose levels will improve Outcome: Progressing   Problem: Nutritional: Goal: Maintenance of adequate nutrition will improve Outcome: Progressing Goal: Progress toward achieving an optimal weight will improve Outcome: Progressing   Problem: Skin Integrity: Goal: Risk for impaired skin integrity will decrease Outcome: Progressing   Problem: Tissue Perfusion: Goal: Adequacy of tissue perfusion will improve Outcome: Progressing   Problem: Fluid Volume: Goal: Hemodynamic stability will improve Outcome: Progressing   Problem: Clinical Measurements: Goal: Diagnostic test results will improve Outcome: Progressing Goal: Signs and symptoms of infection will decrease Outcome: Progressing   Problem: Respiratory: Goal: Ability to maintain adequate ventilation will improve Outcome: Progressing   Problem: Education: Goal: Knowledge about tracheostomy care/management will improve Outcome: Progressing   Problem: Activity: Goal: Ability to tolerate increased activity will improve Outcome: Progressing   Problem: Health Behavior/Discharge Planning: Goal: Ability to manage tracheostomy will improve Outcome: Progressing   Problem: Respiratory: Goal: Patent airway maintenance will improve Outcome: Progressing   Problem: Role Relationship: Goal: Ability to communicate will improve Outcome: Progressing

## 2023-11-07 NOTE — Anesthesia Procedure Notes (Signed)
 Procedure Name: Intubation Date/Time: 10/16/2023 9:05 AM  Performed by: Gabe Jock, CRNAPre-anesthesia Checklist: Patient identified, Emergency Drugs available, Suction available and Patient being monitored Patient Re-evaluated:Patient Re-evaluated prior to induction Oxygen Delivery Method: Ambu bag Preoxygenation: Pre-oxygenation with 100% oxygen Ventilation: Mask ventilation with difficulty and Oral airway inserted - appropriate to patient size Laryngoscope Size: Glidescope and 4 Grade View: Grade I Tube type: Oral Tube size: 7.0 mm Number of attempts: 1 Airway Equipment and Method: Stylet and Oral airway Placement Confirmation: ETT inserted through vocal cords under direct vision, positive ETCO2 and breath sounds checked- equal and bilateral Secured at: 23 cm Tube secured with: Tape Dental Injury: Teeth and Oropharynx as per pre-operative assessment  Comments: Called to pt room, code in progress. Unable to ventilate through trach. PPV with 100% FIO2 via ambu, oral airway. Intubated and trach removed. +BBS, +ETCO2. Stoma covered with 4x4 gauze and tegaderm by Dr. Treen.

## 2023-11-07 NOTE — Progress Notes (Signed)
 [facility] called.  Pt not a possible donor.

## 2023-11-07 NOTE — Procedures (Signed)
 Cardiopulmonary Resuscitation Note  Jesus [name]  [id number]  22-Sep-1955  Date:10/09/2023  Time:9:42 AM   Provider Performing:Pete E Burna Carrier   Procedure: Cardiopulmonary Resuscitation [phone])  Indication(s) Loss of Pulse  Consent N/A  Anesthesia N/A   Time Out N/A   Sterile Technique Hand hygiene, gloves   Procedure Description Called to patient's room for CODE BLUE.  This is 68 year old male patient initial rhythm was PEA/Asystole. 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  He was initially transferred here from [facility] for treatment of pneumonia, this morning was called to CPR in progress.  Patient had had PEA arrest after being found with tracheostomy occluded and malpositioned.  Staff was unable to replace at bedside developed progressive cardiovascular decline leading to PEA arrest.  Ultimately intubated from above by CRNA during CPR    When initially called following airway stabilization Rosc had been achieved after 20 minutes however blood pressure only palpable on 20 mcg/min of norepinephrine.  The plan was to initiate vasopressin infusion continue ventilation and norepinephrine and transferred to the ICU for further evaluation.  I was called less than 2 minutes following the initial phone call and notified the patient was again in cardiac arrest I arrived to the room with CPR in progress.  On my arrival the patient was unresponsive pulse was indeed in PEA, respiratory therapy noting difficult to ventilate.  He had received several doses of epinephrine , there was blood all over the room from the tracheostomy dislodgment, he had not responded to therapy. Patient received high quality chest compressions for >35 minutes (total) with several rounds Epinephrine  was administered every 3 minutes as directed by time keeper. Additional  pharmacologic interventions included . Additional procedural interventions include intubation.  Return of spontaneous circulation was not achieved.   Family at bedside.   Complications/Tolerance N/A   EBL N/A   Specimen(s) N/A  Estimated time to ROSC: patient did not survive Time of death [phone]

## 2023-11-07 NOTE — Progress Notes (Signed)
 Pt cleaned and family allowed in to see him.

## 2023-11-07 NOTE — Progress Notes (Signed)
 VAST RN X2 responded to Code Blue. Pt with port accessed and utilized for code drugs until fluids and vasopressor started through port. PIV in right arm them used for push drugs during code. Lab responded to code as well as T&S ordered this am. MD running code stated labs not needed at this time; lab tech notified.

## 2023-11-07 DEATH — deceased

## 2024-01-16 MED FILL — Medication: Qty: 1 | Status: AC
